# Patient Record
Sex: Male | Born: 1960 | Race: White | Hispanic: No | Marital: Single | Smoking: Never smoker
Health system: Southern US, Community
[De-identification: ages and names within clinical notes are randomized; demographics above are authoritative.]

## PROBLEM LIST (undated history)

## (undated) DIAGNOSIS — E785 Hyperlipidemia, unspecified: Secondary | ICD-10-CM

## (undated) DIAGNOSIS — N049 Nephrotic syndrome with unspecified morphologic changes: Secondary | ICD-10-CM

## (undated) DIAGNOSIS — E039 Hypothyroidism, unspecified: Secondary | ICD-10-CM

## (undated) DIAGNOSIS — I739 Peripheral vascular disease, unspecified: Secondary | ICD-10-CM

## (undated) DIAGNOSIS — M1990 Unspecified osteoarthritis, unspecified site: Secondary | ICD-10-CM

## (undated) DIAGNOSIS — I10 Essential (primary) hypertension: Secondary | ICD-10-CM

## (undated) DIAGNOSIS — Z992 Dependence on renal dialysis: Secondary | ICD-10-CM

## (undated) DIAGNOSIS — D649 Anemia, unspecified: Secondary | ICD-10-CM

## (undated) DIAGNOSIS — E119 Type 2 diabetes mellitus without complications: Secondary | ICD-10-CM

## (undated) DIAGNOSIS — E669 Obesity, unspecified: Secondary | ICD-10-CM

## (undated) DIAGNOSIS — I429 Cardiomyopathy, unspecified: Secondary | ICD-10-CM

## (undated) DIAGNOSIS — N2581 Secondary hyperparathyroidism of renal origin: Secondary | ICD-10-CM

## (undated) DIAGNOSIS — I272 Pulmonary hypertension, unspecified: Secondary | ICD-10-CM

## (undated) DIAGNOSIS — K219 Gastro-esophageal reflux disease without esophagitis: Secondary | ICD-10-CM

## (undated) HISTORY — DX: Unspecified osteoarthritis, unspecified site: M19.90

## (undated) HISTORY — DX: Type 2 diabetes mellitus without complications: E11.9

## (undated) HISTORY — PX: TONSILLECTOMY: SUR1361

## (undated) HISTORY — DX: Peripheral vascular disease, unspecified: I73.9

## (undated) HISTORY — DX: Gastro-esophageal reflux disease without esophagitis: K21.9

---

## 2016-06-13 HISTORY — PX: CATARACT EXTRACTION, BILATERAL: SHX1313

## 2017-12-28 ENCOUNTER — Other Ambulatory Visit: Payer: Self-pay

## 2017-12-28 ENCOUNTER — Emergency Department (HOSPITAL_COMMUNITY): Payer: Self-pay

## 2017-12-28 ENCOUNTER — Emergency Department (HOSPITAL_COMMUNITY)
Admission: EM | Admit: 2017-12-28 | Discharge: 2017-12-28 | Disposition: A | Discharge status: Home / Self Care | Payer: Self-pay | Attending: Emergency Medicine | Admitting: Emergency Medicine

## 2017-12-28 ENCOUNTER — Encounter (HOSPITAL_COMMUNITY): Payer: Self-pay | Admitting: Emergency Medicine

## 2017-12-28 DIAGNOSIS — E1165 Type 2 diabetes mellitus with hyperglycemia: Secondary | ICD-10-CM | POA: Insufficient documentation

## 2017-12-28 DIAGNOSIS — E119 Type 2 diabetes mellitus without complications: Secondary | ICD-10-CM

## 2017-12-28 DIAGNOSIS — R739 Hyperglycemia, unspecified: Secondary | ICD-10-CM

## 2017-12-28 DIAGNOSIS — Z139 Encounter for screening, unspecified: Secondary | ICD-10-CM

## 2017-12-28 DIAGNOSIS — L03031 Cellulitis of right toe: Secondary | ICD-10-CM | POA: Insufficient documentation

## 2017-12-28 LAB — BLOOD GAS, VENOUS
Acid-Base Excess: 2.2 mmol/L — ABNORMAL HIGH (ref 0.0–2.0)
Bicarbonate: 24.5 mmol/L (ref 20.0–28.0)
FIO2: 21
O2 Saturation: 46.6 %
PATIENT TEMPERATURE: 37.4
PCO2 VEN: 44.8 mmHg (ref 44.0–60.0)
pH, Ven: 7.39 (ref 7.250–7.430)

## 2017-12-28 LAB — BASIC METABOLIC PANEL
Anion gap: 10 (ref 5–15)
BUN: 16 mg/dL (ref 6–20)
CALCIUM: 9.5 mg/dL (ref 8.9–10.3)
CO2: 26 mmol/L (ref 22–32)
CREATININE: 1.07 mg/dL (ref 0.61–1.24)
Chloride: 93 mmol/L — ABNORMAL LOW (ref 98–111)
GFR calc non Af Amer: >60 mL/min (ref >60)
Glucose, Bld: 504 mg/dL (ref 70–99)
Potassium: 4.5 mmol/L (ref 3.5–5.1)
SODIUM: 129 mmol/L — AB (ref 135–145)

## 2017-12-28 LAB — URINALYSIS, ROUTINE W REFLEX MICROSCOPIC
Bacteria, UA: NONE SEEN
Bilirubin Urine: NEGATIVE
HGB URINE DIPSTICK: NEGATIVE
Ketones, ur: NEGATIVE mg/dL
LEUKOCYTES UA: NEGATIVE
Nitrite: NEGATIVE
PH: 5 (ref 5.0–8.0)
PROTEIN: NEGATIVE mg/dL
Specific Gravity, Urine: 1.028 (ref 1.005–1.030)

## 2017-12-28 LAB — CBC
HCT: 45.4 % (ref 39.0–52.0)
Hemoglobin: 15.8 g/dL (ref 13.0–17.0)
MCH: 30.1 pg (ref 26.0–34.0)
MCHC: 34.8 g/dL (ref 30.0–36.0)
MCV: 86.5 fL (ref 78.0–100.0)
PLATELETS: 196 10*3/uL (ref 150–400)
RBC: 5.25 MIL/uL (ref 4.22–5.81)
RDW: 12.5 % (ref 11.5–15.5)
WBC: 6.7 10*3/uL (ref 4.0–10.5)

## 2017-12-28 LAB — CBG MONITORING, ED
Glucose-Capillary: 495 mg/dL — ABNORMAL HIGH (ref 70–99)
Glucose-Capillary: 332 mg/dL — ABNORMAL HIGH (ref 70–99)

## 2017-12-28 LAB — POCT GLYCOSYLATED HEMOGLOBIN (HGB A1C): HEMOGLOBIN A1C: 12.9 % — AB (ref 4.0–5.6)

## 2017-12-28 LAB — GLUCOSE, POCT (MANUAL RESULT ENTRY): POC Glucose: 582 mg/dl — AB (ref 70–99)

## 2017-12-28 MED ORDER — SODIUM CHLORIDE 0.9 % IV BOLUS
1000.0000 mL | Freq: Once | INTRAVENOUS | Status: AC
  Administered 2017-12-28: 1000 mL via INTRAVENOUS

## 2017-12-28 MED ORDER — METFORMIN HCL 500 MG PO TABS: 500.0000 mg | ORAL_TABLET | Freq: Two times a day (BID) | ORAL | 0 refills | Status: DC

## 2017-12-28 MED ORDER — METFORMIN HCL 500 MG PO TABS
500.0000 mg | ORAL_TABLET | Freq: Once | ORAL | Status: AC
  Administered 2017-12-28: 500 mg via ORAL
  Filled 2017-12-28: qty 1

## 2017-12-28 MED ORDER — DOXYCYCLINE HYCLATE 100 MG PO CAPS: 100.0000 mg | ORAL_CAPSULE | Freq: Two times a day (BID) | ORAL | 0 refills | Status: DC

## 2017-12-28 NOTE — ED Notes (Addendum)
CBG 495.

## 2017-12-28 NOTE — ED Notes (Signed)
CRITICAL VALUE ALERT  Critical Value:  pH 7.39, pCO2 44.8, pO2 too low to decect  Date & Time Notied:  12/28/17 1650  Provider Notified: Dr. Alvino Chapel notified  Orders Received/Actions taken: EDP notified, no further orders given. Primary RN notified as well.

## 2017-12-28 NOTE — ED Triage Notes (Signed)
Sent by [facility] for high cbg, pt is undiagnosed diabetic.  Pt went to clinic for sore to rt big toe.

## 2017-12-28 NOTE — Congregational Nurse Program (Signed)
Congregational Nurse Program Note  Date of Encounter: 12/28/2017  Past Medical History: No past medical history on file.  Encounter Details: CNP Questionnaire - 12/28/17 1508      Questionnaire   Patient Status  Not Applicable    Race  White or Caucasian    Location Patient Served At  [facility]  Not Applicable    Uninsured  Uninsured (NEW 1x/quarter)    Food  No food insecurities    Housing/Utilities  Yes, have permanent housing    Transportation  Yes, need transportation assistance    Interpersonal Safety  Yes, feel physically and emotionally safe where you currently live    Medication  No medication insecurities    Medical Provider  No    Referrals  [facility];Medication Assistance;Primary Care Provider/Clinic;Emergency Department    ED Visit Averted  Not Applicable    Life-Saving Intervention Made  Yes       New client today into [place]. Mother called in tears that her son had been treated over a week ago at an Urgent Care in [place] and was given antibiotics and his toe was not improving. Appointment made to come into [facility] today at 1:45pm for evaluation and screening and referral to primary care as needed and if needed Emergency Room referral. Client has been unemployed for over a year and is currently uninsured. Mother states he lives beside her and she pays his bills.   Client in today and states he just finished a 10 day course of antibiotics taking the last pill today. He was prescribed SMZ-TMP DS 800-160 mg on 12/18/17.  Client states that he had some "calluses on my right big toe and I used a pedi tool to shave it off" He states that about 2 days later in turned red and his mother took him to Urgent care. He states it has been draining what he describes as blood. Client's right great toe red, and underneath about nickel sized dark area with a white center. No drainage noted but area of stain on patient's  sock. Top of toe area appears to have peeled and is reddened. Ball of client's foot with some redness. No swelling noted in foot or leg. Client afebrile today at 98.4 orally.  Vital signs: blood pressure left arm, sitting, normal cuff 148/97, pulse 108, respirations 12 non labored. O2 saturation room air 99%. Non fasting fingerstick glucose 582 ( client reports eating about 2 hours ago ). Client denies ever being diagnosed with diabetes. His mother is diabetic. POC A1C checked with result of 12.9 %  Client denies any chest pains or shortness of breath. He does report a recent dog bite on left hand that he was also recently treated with antibiotics and cream from a [facility]. He states he has not had any primary medical care in over a year since loosing his insurance.  Does report some blurred vision even with glasses, some excessive thirst, and he reports frequent urination at night and he reports weight loss without dieting. Denies nausea or vomiting. He does report dry skin on arms and legs and leg cramps in his thighs recently. He also complains of history of acid reflux but is unsure of what he takes. Denies problems with his bowels. He does report headaches when out in the "heat".   Past medical History: Arthritis GERD Dog bite (treated at [facility]) Right great toe infection treated at [facility] [place] (  completed antibiotics on 12/28/17.  Surgical history Cataract surgery Unknown date  Plan: Discussed with client referral to [facility] Emergency room today for treatment and evaluation of Right great toe ? Ulceration and  Non fasting glucose over 500 in undiagnosed diabetic A1C 12.9. Client agreeable, Mother will transport. Mother extremely emotional regarding her son.  Discussed options for Medical provider.  Mother [place] [facility] told her to call back on August 1 to make an appointment. RN discussed that we could refer client to [facility] and have possible appointment next week.  Client wants referral to [facility]. Referral made to the Free Clinic and appointment secured for 01/01/18 at 10:30 am. Contact information given to client.  RN also referred client to Care Connect and Care Connect enrollment person will meet client at his free clinic appointment on Monday for eligibility screening. Discussed earlier documents to bring and that mother will give a letter stating she supports her son by paying his bills.  Mom is also anxious about applying for Medicaid and also Social Security disability. RN discussed that can be a lengthy process and that we need to first address his wound and his blood glucose.  Discussed that client will need to apply for medicaid at [place] and that social security disability is applied for at [employer] administration. Again client's mother is very emotional and tearful. Reassured client and mother that reason for sending client to ER and referral to primary care at Free clinic was to assist client to better health. RN discussed following provider instructions regarding medications and diet as well as keeping any appointments. Handouts given on diabetes, my plate nutrition guide.  Will follow up with client after Free Clinic appointment on Monday. Instructed client and mother to call if client cannot make appointment Monday and number to call.   Client and mother agreeable to plan and for follow up.   [place]

## 2017-12-28 NOTE — ED Provider Notes (Signed)
[facility] EMERGENCY DEPARTMENT Provider Note   CSN: [id number] Arrival date & time: 12/28/17  1512     History   Chief Complaint Chief Complaint  Patient presents with  . Hyperglycemia    HPI [name] [name] is a 57 y.o. male.     57 year old male presents with new onset diabetes.  He was sent from a nurse clinic office after being diagnosed with a new onset diabetes.  His point-of-care A1c was 12.9 and his fingerstick glucose was 582.  The patient recently has been having a callus and redness/infection to his right great toe.  He was put on Bactrim starting on 7/8 and has finished this.  The redness and swelling is improved.  The patient denies otherwise feeling fatigued or having fevers, polyuria, polydipsia.  He denies any significant past medical history.  History reviewed. No pertinent past medical history.  There are no active problems to display for this patient.   Past Surgical History:  Procedure Laterality Date  . TONSILLECTOMY          Home Medications    Prior to Admission medications   Medication Sig Start Date End Date Taking? Authorizing Provider  acetaminophen (TYLENOL) 500 MG tablet Take 500 mg by mouth every 6 (six) hours as needed.   Yes [provider]  cimetidine (TAGAMET HB) 200 MG tablet Take 200 mg by mouth daily as needed (for acid reducer).   Yes [provider]  hydroxypropyl methylcellulose / hypromellose (ISOPTO TEARS / GONIOVISC) 2.5 % ophthalmic solution Place 1 drop into both eyes 3 (three) times daily as needed for dry eyes.   Yes [provider]  Omega-3 Fat Ac-Cholecalciferol (FLEX OMEGA BENEFITS/VIT D-3) [id number] MG-UNIT CAPS Take 1 tablet by mouth daily as needed.   Yes [provider]  sulfamethoxazole-trimethoprim (BACTRIM DS,SEPTRA DS) 800-160 MG tablet Take 1 tablet by mouth 2 (two) times daily. 12/18/17  Yes [provider]  doxycycline (VIBRAMYCIN) 100 MG capsule Take 1 capsule (100 mg  total) by mouth 2 (two) times daily. One po bid x 7 days 12/28/17   Sherwood Gambler, MD  metFORMIN (GLUCOPHAGE) 500 MG tablet Take 1 tablet (500 mg total) by mouth 2 (two) times daily with a meal. 12/28/17   Sherwood Gambler, MD    Family History No family history on file.  Social History Social History   Tobacco Use  . Smoking status: Never Smoker  . Smokeless tobacco: Never Used  Substance Use Topics  . Alcohol use: Never    Frequency: Never  . Drug use: Never     Allergies   Patient has no known allergies.   Review of Systems Review of Systems  Constitutional: Negative for fatigue and fever.  Respiratory: Negative for shortness of breath.   Cardiovascular: Negative for chest pain.  Gastrointestinal: Negative for vomiting.  Endocrine: Negative for polydipsia and polyuria.  Musculoskeletal: Positive for arthralgias and joint swelling.  Skin: Positive for color change and wound.  All other systems reviewed and are negative.    Physical Exam Updated Vital Signs BP (!) 166/88 (BP Location: Right Arm)   Pulse 80   Temp 99.3 F (37.4 C) (Temporal)   Resp 16   Ht 5\' 10"  (1.778 m)   Wt 62.1 kg (137 lb)   SpO2 100%   BMI 19.66 kg/m   Physical Exam  Constitutional: He is oriented to person, place, and time. He appears well-developed and well-nourished.  HENT:  Head: Normocephalic and atraumatic.  Right Ear: External  ear normal.  Left Ear: External ear normal.  Nose: Nose normal.  Eyes: Right eye exhibits no discharge. Left eye exhibits no discharge.  Neck: Neck supple.  Cardiovascular: Regular rhythm and normal heart sounds. Tachycardia present.  Pulmonary/Chest: Effort normal and breath sounds normal.  Abdominal: Soft. He exhibits no distension. There is no tenderness.  Musculoskeletal:  Right great toe with mild erythema and superficial skin break down. No deep wound or drainage. No increased warmth  Neurological: He is alert and oriented to person, place, and  time.  Skin: Skin is warm and dry.  Nursing note and vitals reviewed.    ED Treatments / Results  Labs (all labs ordered are listed, but only abnormal results are displayed) Labs Reviewed  BASIC METABOLIC PANEL - Abnormal; Notable for the following components:      Result Value   Sodium 129 (*)    Chloride 93 (*)    Glucose, Bld 504 (*)    All other components within normal limits  URINALYSIS, ROUTINE W REFLEX MICROSCOPIC - Abnormal; Notable for the following components:   Color, Urine STRAW (*)    Glucose, UA >=500 (*)    All other components within normal limits  BLOOD GAS, VENOUS - Abnormal; Notable for the following components:   Acid-Base Excess 2.2 (*)    All other components within normal limits  CBG MONITORING, ED - Abnormal; Notable for the following components:   Glucose-Capillary 495 (*)    All other components within normal limits  CBG MONITORING, ED - Abnormal; Notable for the following components:   Glucose-Capillary 332 (*)    All other components within normal limits  CBC    EKG None  Radiology Dg Foot Complete Right  Result Date: 12/28/2017 CLINICAL DATA:  Wound to the right big toe for 1 week.  No injury. EXAM: RIGHT FOOT COMPLETE - 3+ VIEW COMPARISON:  None. FINDINGS: There is no evidence of fracture or dislocation. There is no evidence of bone destruction to suggest osteomyelitis. Plantar calcaneal spur is identified. Minimal soft tissue irregularity is identified in the distal right great toe consistent with history described wound. IMPRESSION: No evidence of acute bony injury or underlying osteomyelitis.Minimal soft tissue irregularity is identified in the distal right great toe consistent with history described wound. Electronically Signed   By: Abelardo Diesel M.D.   On: 12/28/2017 17:00    Procedures Procedures (including critical care time)  Medications Ordered in ED Medications  sodium chloride 0.9 % bolus 1,000 mL (0 mLs Intravenous Stopped  12/28/17 1805)  sodium chloride 0.9 % bolus 1,000 mL (0 mLs Intravenous Stopped 12/28/17 1805)  metFORMIN (GLUCOPHAGE) tablet 500 mg (500 mg Oral Given 12/28/17 1808)     Initial Impression / Assessment and Plan / ED Course  I have reviewed the triage vital signs and the nursing notes.  Pertinent labs & imaging results that were available during my care of the patient were reviewed by me and considered in my medical decision making (see chart for details).     Patient's work-up is consistent with new onset diabetes.  However he does not have acidosis or ketones in the urine. He otherwise has no acute complaints.  His toe is not hurting anymore.  However it is a little erythematous while did improve a little bit with Bactrim I will also give him doxycycline.  I have started him on metformin after being given IV fluids in the ED.  Glucose is coming down appropriately.  He already has  a PCP set up for a new visit in about 4 days.  Thus I think his outpatient follow-up is close enough that he can be discharged home.  No significant elect light disturbance.  No obvious osteomyelitis.  We discussed diet and exercise. Return precautions.  Final Clinical Impressions(s) / ED Diagnoses   Final diagnoses:  New onset type 2 diabetes mellitus ([place])  Hyperglycemia  Cellulitis of great toe of right foot    ED Discharge Orders        Ordered    doxycycline (VIBRAMYCIN) 100 MG capsule  2 times daily     12/28/17 1742    metFORMIN (GLUCOPHAGE) 500 MG tablet  2 times daily with meals     12/28/17 1742       Sherwood Gambler, MD 12/28/17 1831

## 2017-12-28 NOTE — ED Notes (Signed)
Date and time results received: 12/28/17 1703 (use smartphrase ".now" to insert current time)  Test: GLUCOSE Critical Value: 504  Name of Provider Notified: [place]  Orders Received? Or Actions Taken?: NONE

## 2018-01-01 ENCOUNTER — Other Ambulatory Visit (HOSPITAL_COMMUNITY)
Admission: RE | Admit: 2018-01-01 | Discharge: 2018-01-01 | Disposition: A | Discharge status: Home / Self Care | Payer: Self-pay | Source: Ambulatory Visit | Attending: Physician Assistant | Admitting: Physician Assistant

## 2018-01-01 ENCOUNTER — Encounter: Payer: Self-pay | Admitting: Physician Assistant

## 2018-01-01 ENCOUNTER — Ambulatory Visit: Payer: Self-pay | Admitting: Physician Assistant

## 2018-01-01 VITALS — BP 120/78 | HR 92 | Temp 98.1°F | Ht 67.5 in | Wt 137.0 lb

## 2018-01-01 DIAGNOSIS — Z5320 Procedure and treatment not carried out because of patient's decision for unspecified reasons: Secondary | ICD-10-CM

## 2018-01-01 DIAGNOSIS — Z125 Encounter for screening for malignant neoplasm of prostate: Secondary | ICD-10-CM

## 2018-01-01 DIAGNOSIS — Z13220 Encounter for screening for lipoid disorders: Secondary | ICD-10-CM | POA: Insufficient documentation

## 2018-01-01 DIAGNOSIS — Z7689 Persons encountering health services in other specified circumstances: Secondary | ICD-10-CM

## 2018-01-01 DIAGNOSIS — S91109S Unspecified open wound of unspecified toe(s) without damage to nail, sequela: Secondary | ICD-10-CM

## 2018-01-01 DIAGNOSIS — R739 Hyperglycemia, unspecified: Secondary | ICD-10-CM

## 2018-01-01 DIAGNOSIS — E1169 Type 2 diabetes mellitus with other specified complication: Secondary | ICD-10-CM

## 2018-01-01 LAB — COMPREHENSIVE METABOLIC PANEL
ALBUMIN: 4.2 g/dL (ref 3.5–5.0)
ALK PHOS: 102 U/L (ref 38–126)
ALT: 12 U/L (ref 0–44)
AST: 11 U/L — AB (ref 15–41)
Anion gap: 9 (ref 5–15)
BUN: 18 mg/dL (ref 6–20)
CHLORIDE: 101 mmol/L (ref 98–111)
CO2: 27 mmol/L (ref 22–32)
CREATININE: 0.67 mg/dL (ref 0.61–1.24)
Calcium: 9.9 mg/dL (ref 8.9–10.3)
GFR calc Af Amer: >60 mL/min (ref >60)
GFR calc non Af Amer: >60 mL/min (ref >60)
GLUCOSE: 233 mg/dL — AB (ref 70–99)
Potassium: 4.3 mmol/L (ref 3.5–5.1)
SODIUM: 137 mmol/L (ref 135–145)
Total Bilirubin: 0.8 mg/dL (ref 0.3–1.2)
Total Protein: 8 g/dL (ref 6.5–8.1)

## 2018-01-01 LAB — PSA: PROSTATIC SPECIFIC ANTIGEN: 1.42 ng/mL (ref 0.00–4.00)

## 2018-01-01 LAB — GLUCOSE, POCT (MANUAL RESULT ENTRY): POC Glucose: 219 mg/dl — AB (ref 70–99)

## 2018-01-01 LAB — LIPID PANEL
Cholesterol: 248 mg/dL — ABNORMAL HIGH (ref 0–200)
HDL: 46 mg/dL (ref >40)
LDL CALC: UNDETERMINED mg/dL (ref 0–99)
TRIGLYCERIDES: 428 mg/dL — AB (ref <150)
Total CHOL/HDL Ratio: 5.4 RATIO
VLDL: UNDETERMINED mg/dL (ref 0–40)

## 2018-01-01 NOTE — Progress Notes (Signed)
BP 120/78 (BP Location: Left Arm, Patient Position: Sitting, Cuff Size: Normal)   Pulse 92   Temp 98.1 F (36.7 C)   Ht 5' 7.5" (1.715 m)   Wt 137 lb (62.1 kg)   SpO2 98%   BMI 21.14 kg/m    Subjective:    Patient ID: [name] [name], male    DOB: Dec 22, 1960, 57 y.o.   MRN: [id number]  HPI: [name] [name] is a 57 y.o. male presenting on 01/01/2018 for New Patient (Initial Visit) (pt here today with his mother. pt was previously seen at [facility] pt says they were not primary care. pt states they treated him for arthritis on his legs); Arthritis (pt states makes it difficult to walk); Diabetes (pt states [facility] sent to [facility] on 12-28-17. pt states that is when he was dx with DM. pt states sugars were in the 300 after treatment.); and Foot Problem (R great toe. pt states it is infected but is getting better. pt was given a second round of antibiotics which has made it improve.)   HPI Chief Complaint  Patient presents with  . New Patient (Initial Visit)    pt here today with his mother. pt was previously seen at [facility] at [facility] pt says they were not primary care. pt states they treated him for arthritis on his legs  . Arthritis    pt states makes it difficult to walk  . Diabetes    pt states [facility]ores sent to [facility] on 12-28-17. pt states that is when he was dx with DM. pt states sugars were in the 300 after treatment.  . Foot Problem    R great toe. pt states it is infected but is getting better. pt was given a second round of antibiotics which has made it improve.    Pt previously worked at [employer] in [place].   He missed too many days of work so he was fired.   His toe wound started after he used a pedi-scrubber trying to remove a callus.  He is currently on his second round of antibiotics.  Pt says he feels like the wound is improving.   Pt looks to his mother for an answer when asked how does he  feel.    Relevant past medical, surgical, family and social history reviewed and updated as indicated. Interim medical history since our last visit reviewed. Allergies and medications reviewed and updated.   Current Outpatient Medications:  .  acetaminophen (TYLENOL) 500 MG tablet, Take 500 mg by mouth every 6 (six) hours as needed (arthritis). , Disp: , Rfl:  .  Calcium Carbonate-Simethicone (ALKA-SELTZER HEARTBURN + GAS) 750-80 MG CHEW, Chew 1 tablet by mouth daily as needed., Disp: , Rfl:  .  cimetidine (TAGAMET HB) 200 MG tablet, Take 200 mg by mouth daily as needed (for acid reducer)., Disp: , Rfl:  .  doxycycline (VIBRAMYCIN) 100 MG capsule, Take 1 capsule (100 mg total) by mouth 2 (two) times daily. One po bid x 7 days, Disp: 14 capsule, Rfl: 0 .  metFORMIN (GLUCOPHAGE) 500 MG tablet, Take 1 tablet (500 mg total) by mouth 2 (two) times daily with a meal., Disp: 60 tablet, Rfl: 0   Review of Systems  Constitutional: Positive for unexpected weight change. Negative for appetite change, chills, diaphoresis, fatigue and fever.  HENT: Negative for congestion, dental problem, drooling, ear pain, facial swelling, hearing loss, mouth sores, sneezing, sore throat, trouble swallowing and voice change.  Eyes: Negative for pain, discharge, redness, itching and visual disturbance.  Respiratory: Negative for cough, choking, shortness of breath and wheezing.   Cardiovascular: Negative for chest pain, palpitations and leg swelling.  Gastrointestinal: Negative for abdominal pain, blood in stool, constipation, diarrhea and vomiting.  Endocrine: Negative for cold intolerance, heat intolerance and polydipsia.  Genitourinary: Negative for decreased urine volume, dysuria and hematuria.  Musculoskeletal: Positive for arthralgias and gait problem. Negative for back pain.  Skin: Negative for rash.  Allergic/Immunologic: Negative for environmental allergies.  Neurological: Negative for seizures, syncope,  light-headedness and headaches.  Hematological: Negative for adenopathy.  Psychiatric/Behavioral: Negative for agitation, dysphoric mood and suicidal ideas. The patient is not nervous/anxious.     Per HPI unless specifically indicated above     Objective:    BP 120/78 (BP Location: Left Arm, Patient Position: Sitting, Cuff Size: Normal)   Pulse 92   Temp 98.1 F (36.7 C)   Ht 5' 7.5" (1.715 m)   Wt 137 lb (62.1 kg)   SpO2 98%   BMI 21.14 kg/m   Wt Readings from Last 3 Encounters:  01/01/18 137 lb (62.1 kg)  12/28/17 137 lb (62.1 kg)  12/28/17 137 lb 6.4 oz (62.3 kg)    Physical Exam  Constitutional: He is oriented to person, place, and time. He appears well-developed and well-nourished.  HENT:  Head: Normocephalic and atraumatic.  Mouth/Throat: Oropharynx is clear and moist. No oropharyngeal exudate.  Eyes: Pupils are equal, round, and reactive to light. Conjunctivae and EOM are normal.  Neck: Neck supple. No thyromegaly present.  Cardiovascular: Normal rate and regular rhythm.  Pulmonary/Chest: Effort normal and breath sounds normal. He has no wheezes. He has no rales.  Abdominal: Soft. Bowel sounds are normal. He exhibits no mass. There is no hepatosplenomegaly. There is no tenderness.  Musculoskeletal: He exhibits no edema.  Lymphadenopathy:    He has no cervical adenopathy.  Neurological: He is alert and oriented to person, place, and time.  Skin: Skin is warm and dry. No rash noted.  Wound R great toe appears to be healing.  No purulence or swelling.  Psychiatric: He has a normal mood and affect. His behavior is normal. Thought content normal.  Vitals reviewed.           Results for orders placed or performed during the hospital encounter of 38/18/29  Basic metabolic panel  Result Value Ref Range   Sodium 129 (L) 135 - 145 mmol/L   Potassium 4.5 3.5 - 5.1 mmol/L   Chloride 93 (L) 98 - 111 mmol/L   CO2 26 22 - 32 mmol/L   Glucose, Bld 504 (HH) 70 - 99  mg/dL   BUN 16 6 - 20 mg/dL   Creatinine, Ser 1.07 0.61 - 1.24 mg/dL   Calcium 9.5 8.9 - 10.3 mg/dL   GFR calc non Af Amer >60 >60 mL/min   GFR calc Af Amer >60 >60 mL/min   Anion gap 10 5 - 15  CBC  Result Value Ref Range   WBC 6.7 4.0 - 10.5 K/uL   RBC 5.25 4.22 - 5.81 MIL/uL   Hemoglobin 15.8 13.0 - 17.0 g/dL   HCT 45.4 39.0 - 52.0 %   MCV 86.5 78.0 - 100.0 fL   MCH 30.1 26.0 - 34.0 pg   MCHC 34.8 30.0 - 36.0 g/dL   RDW 12.5 11.5 - 15.5 %   Platelets 196 150 - 400 K/uL  Urinalysis, Routine w reflex microscopic  Result Value Ref Range  Color, Urine STRAW (A) YELLOW   APPearance CLEAR CLEAR   Specific Gravity, Urine 1.028 1.005 - 1.030   pH 5.0 5.0 - 8.0   Glucose, UA >=500 (A) NEGATIVE mg/dL   Hgb urine dipstick NEGATIVE NEGATIVE   Bilirubin Urine NEGATIVE NEGATIVE   Ketones, ur NEGATIVE NEGATIVE mg/dL   Protein, ur NEGATIVE NEGATIVE mg/dL   Nitrite NEGATIVE NEGATIVE   Leukocytes, UA NEGATIVE NEGATIVE   RBC / HPF 0-5 0 - 5 RBC/hpf   WBC, UA 0-5 0 - 5 WBC/hpf   Bacteria, UA NONE SEEN NONE SEEN   Mucus PRESENT   Blood gas, venous  Result Value Ref Range   FIO2 21.00    pH, Ven 7.39 7.250 - 7.430   pCO2, Ven 44.8 44.0 - 60.0 mmHg   pO2, Ven  32.0 - 45.0 mmHg    CRITICAL RESULT CALLED TO, READ BACK BY AND VERIFIED WITH:   Bicarbonate 24.5 20.0 - 28.0 mmol/L   Acid-Base Excess 2.2 (H) 0.0 - 2.0 mmol/L   O2 Saturation 46.6 %   Patient temperature 37.4    Collection site VEIN    Drawn by DRAWN BY RN    Sample type VEIN   CBG monitoring, ED  Result Value Ref Range   Glucose-Capillary 495 (H) 70 - 99 mg/dL  CBG monitoring, ED  Result Value Ref Range   Glucose-Capillary 332 (H) 70 - 99 mg/dL      Assessment & Plan:   Encounter Diagnoses  Name Primary?  . Encounter to establish care Yes  . Type 2 diabetes mellitus with other specified complication, unspecified whether long term insulin use ([place])   . Screening cholesterol level   . Open toe wound, sequela   .  Hyperglycemia   . Screening for prostate cancer   . Colon cancer screening declined     -Pt declined colon cancer screening test/iFOBT.  He was encouragd to think about getting the colon cancer screening test -Pt has appointment aug 8 for with social services to try to get medcaid -pt to get a few more lab tests done.  He says he doesn't want any more tests but his mother tells him he needs to so he says he will -pt is counseled to avoid using scrubbing or cutting on feet -pt is counseled to look at his feet every day.  He is to RTO right away if his toe wound starts to look worse -pt to follow up next week to recheck toe and review labs.  Discussed with pt that his medication will likely be adjusted at next OV.

## 2018-01-01 NOTE — Patient Instructions (Signed)
Diabetes Mellitus and Nutrition When you have diabetes (diabetes mellitus), it is very important to have healthy eating habits because your blood sugar (glucose) levels are greatly affected by what you eat and drink. Eating healthy foods in the appropriate amounts, at about the same times every day, can help you:  Control your blood glucose.  Lower your risk of heart disease.  Improve your blood pressure.  Reach or maintain a healthy weight.  Every person with diabetes is different, and each person has different needs for a meal plan. Your health care provider may recommend that you work with a diet and nutrition specialist (dietitian) to make a meal plan that is best for you. Your meal plan may vary depending on factors such as:  The calories you need.  The medicines you take.  Your weight.  Your blood glucose, blood pressure, and cholesterol levels.  Your activity level.  Other health conditions you have, such as heart or kidney disease.  How do carbohydrates affect me? Carbohydrates affect your blood glucose level more than any other type of food. Eating carbohydrates naturally increases the amount of glucose in your blood. Carbohydrate counting is a method for keeping track of how many carbohydrates you eat. Counting carbohydrates is important to keep your blood glucose at a healthy level, especially if you use insulin or take certain oral diabetes medicines. It is important to know how many carbohydrates you can safely have in each meal. This is different for every person. Your dietitian can help you calculate how many carbohydrates you should have at each meal and for snack. Foods that contain carbohydrates include:  Bread, cereal, rice, pasta, and crackers.  Potatoes and corn.  Peas, beans, and lentils.  Milk and yogurt.  Fruit and juice.  Desserts, such as cakes, cookies, ice cream, and candy.  How does alcohol affect me? Alcohol can cause a sudden decrease in blood  glucose (hypoglycemia), especially if you use insulin or take certain oral diabetes medicines. Hypoglycemia can be a life-threatening condition. Symptoms of hypoglycemia (sleepiness, dizziness, and confusion) are similar to symptoms of having too much alcohol. If your health care provider says that alcohol is safe for you, follow these guidelines:  Limit alcohol intake to no more than 1 drink per day for nonpregnant women and 2 drinks per day for men. One drink equals 12 oz of beer, 5 oz of wine, or 1 oz of hard liquor.  Do not drink on an empty stomach.  Keep yourself hydrated with water, diet soda, or unsweetened iced tea.  Keep in mind that regular soda, juice, and other mixers may contain a lot of sugar and must be counted as carbohydrates.  What are tips for following this plan? Reading food labels  Start by checking the serving size on the label. The amount of calories, carbohydrates, fats, and other nutrients listed on the label are based on one serving of the food. Many foods contain more than one serving per package.  Check the total grams (g) of carbohydrates in one serving. You can calculate the number of servings of carbohydrates in one serving by dividing the total carbohydrates by 15. For example, if a food has 30 g of total carbohydrates, it would be equal to 2 servings of carbohydrates.  Check the number of grams (g) of saturated and trans fats in one serving. Choose foods that have low or no amount of these fats.  Check the number of milligrams (mg) of sodium in one serving. Most people   should limit total sodium intake to less than 2,300 mg per day.  Always check the nutrition information of foods labeled as "low-fat" or "nonfat". These foods may be higher in added sugar or refined carbohydrates and should be avoided.  Talk to your dietitian to identify your daily goals for nutrients listed on the label. Shopping  Avoid buying canned, premade, or processed foods. These  foods tend to be high in fat, sodium, and added sugar.  Shop around the outside edge of the grocery store. This includes fresh fruits and vegetables, bulk grains, fresh meats, and fresh dairy. Cooking  Use low-heat cooking methods, such as baking, instead of high-heat cooking methods like deep frying.  Cook using healthy oils, such as olive, canola, or sunflower oil.  Avoid cooking with butter, cream, or high-fat meats. Meal planning  Eat meals and snacks regularly, preferably at the same times every day. Avoid going long periods of time without eating.  Eat foods high in fiber, such as fresh fruits, vegetables, beans, and whole grains. Talk to your dietitian about how many servings of carbohydrates you can eat at each meal.  Eat 4-6 ounces of lean protein each day, such as lean meat, chicken, fish, eggs, or tofu. 1 ounce is equal to 1 ounce of meat, chicken, or fish, 1 egg, or 1/4 cup of tofu.  Eat some foods each day that contain healthy fats, such as avocado, nuts, seeds, and fish. Lifestyle   Check your blood glucose regularly.  Exercise at least 30 minutes 5 or more days each week, or as told by your health care provider.  Take medicines as told by your health care provider.  Do not use any products that contain nicotine or tobacco, such as cigarettes and e-cigarettes. If you need help quitting, ask your health care provider.  Work with a counselor or diabetes educator to identify strategies to manage stress and any emotional and social challenges. What are some questions to ask my health care provider?  Do I need to meet with a diabetes educator?  Do I need to meet with a dietitian?  What number can I call if I have questions?  When are the best times to check my blood glucose? Where to find more information:  American Diabetes Association: diabetes.org/food-and-fitness/food  Academy of Nutrition and Dietetics:  www.eatright.org/resources/health/diseases-and-conditions/diabetes  National Institute of Diabetes and Digestive and Kidney Diseases (NIH): www.niddk.nih.gov/health-information/diabetes/overview/diet-eating-physical-activity Summary  A healthy meal plan will help you control your blood glucose and maintain a healthy lifestyle.  Working with a diet and nutrition specialist (dietitian) can help you make a meal plan that is best for you.  Keep in mind that carbohydrates and alcohol have immediate effects on your blood glucose levels. It is important to count carbohydrates and to use alcohol carefully. This information is not intended to replace advice given to you by your health care provider. Make sure you discuss any questions you have with your health care provider. Document Released: 02/24/2005 Document Revised: 07/04/2016 Document Reviewed: 07/04/2016 Elsevier Interactive Patient Education  2018 Elsevier Inc.  

## 2018-01-02 LAB — MICROALBUMIN, URINE: MICROALB UR: 114 ug/mL — AB

## 2018-01-02 LAB — C-PEPTIDE: C-Peptide: 2.9 ng/mL (ref 1.1–4.4)

## 2018-01-08 ENCOUNTER — Encounter: Payer: Self-pay | Admitting: Physician Assistant

## 2018-01-08 ENCOUNTER — Telehealth: Payer: Self-pay

## 2018-01-08 ENCOUNTER — Ambulatory Visit: Payer: Self-pay | Admitting: Physician Assistant

## 2018-01-08 VITALS — BP 152/90 | HR 82 | Temp 98.4°F | Ht 67.5 in | Wt 141.2 lb

## 2018-01-08 DIAGNOSIS — S91109S Unspecified open wound of unspecified toe(s) without damage to nail, sequela: Secondary | ICD-10-CM

## 2018-01-08 DIAGNOSIS — I10 Essential (primary) hypertension: Secondary | ICD-10-CM

## 2018-01-08 DIAGNOSIS — E1165 Type 2 diabetes mellitus with hyperglycemia: Secondary | ICD-10-CM | POA: Insufficient documentation

## 2018-01-08 DIAGNOSIS — K219 Gastro-esophageal reflux disease without esophagitis: Secondary | ICD-10-CM

## 2018-01-08 DIAGNOSIS — E785 Hyperlipidemia, unspecified: Secondary | ICD-10-CM | POA: Insufficient documentation

## 2018-01-08 DIAGNOSIS — E1169 Type 2 diabetes mellitus with other specified complication: Secondary | ICD-10-CM | POA: Insufficient documentation

## 2018-01-08 MED ORDER — SITAGLIPTIN PHOSPHATE 100 MG PO TABS: 100.0000 mg | ORAL_TABLET | Freq: Every day | ORAL | 0 refills | Status: DC

## 2018-01-08 MED ORDER — METFORMIN HCL 1000 MG PO TABS: 1000.0000 mg | ORAL_TABLET | Freq: Two times a day (BID) | ORAL | 1 refills | Status: DC

## 2018-01-08 MED ORDER — ATORVASTATIN CALCIUM 20 MG PO TABS: 20.0000 mg | ORAL_TABLET | Freq: Every day | ORAL | 1 refills | Status: DC

## 2018-01-08 MED ORDER — FISH OIL 1200 MG PO CAPS: 1.0000 | ORAL_CAPSULE | Freq: Two times a day (BID) | ORAL | Status: DC

## 2018-01-08 MED ORDER — LISINOPRIL 10 MG PO TABS: 10.0000 mg | ORAL_TABLET | Freq: Every day | ORAL | 1 refills | Status: DC

## 2018-01-08 MED ORDER — FAMOTIDINE 20 MG PO TABS: 20.0000 mg | ORAL_TABLET | Freq: Two times a day (BID) | ORAL | 3 refills | Status: DC | PRN

## 2018-01-08 NOTE — Progress Notes (Signed)
BP (!) 152/90 (BP Location: Right Arm, Patient Position: Sitting, Cuff Size: Normal)   Pulse 82   Temp 98.4 F (36.9 C)   Ht 5' 7.5" (1.715 m)   Wt 141 lb 4 oz (64.1 kg)   SpO2 98%   BMI 21.80 kg/m    Subjective:    Patient ID: [name] [name], male    DOB: 1960/09/15, 57 y.o.   MRN: [id number]  HPI: [name] [name] is a 57 y.o. male presenting on 01/08/2018 for Diabetes   HPI  Pt says he is stressed a bit today.  He says his foot feels fine.  He finished his doxycycline.    Relevant past medical, surgical, family and social history reviewed and updated as indicated. Interim medical history since our last visit reviewed. Allergies and medications reviewed and updated.   Current Outpatient Medications:  .  acetaminophen (TYLENOL) 500 MG tablet, Take 500 mg by mouth every 6 (six) hours as needed (arthritis). , Disp: , Rfl:  .  Calcium Carbonate-Simethicone (ALKA-SELTZER HEARTBURN + GAS) 750-80 MG CHEW, Chew 1 tablet by mouth daily as needed., Disp: , Rfl:  .  cimetidine (TAGAMET HB) 200 MG tablet, Take 200 mg by mouth daily as needed (for acid reducer)., Disp: , Rfl:  .  metFORMIN (GLUCOPHAGE) 500 MG tablet, Take 1 tablet (500 mg total) by mouth 2 (two) times daily with a meal., Disp: 60 tablet, Rfl: 0 .  doxycycline (VIBRAMYCIN) 100 MG capsule, Take 1 capsule (100 mg total) by mouth 2 (two) times daily. One po bid x 7 days (Patient not taking: Reported on 01/08/2018), Disp: 14 capsule, Rfl: 0  Review of Systems  Constitutional: Negative for appetite change, chills, diaphoresis, fatigue, fever and unexpected weight change.  HENT: Negative for congestion, dental problem, drooling, ear pain, facial swelling, hearing loss, mouth sores, sneezing, sore throat, trouble swallowing and voice change.   Eyes: Negative for pain, discharge, redness, itching and visual disturbance.  Respiratory: Negative for cough, choking, shortness of breath and wheezing.   Cardiovascular: Negative for chest  pain, palpitations and leg swelling.  Gastrointestinal: Negative for abdominal pain, blood in stool, constipation, diarrhea and vomiting.  Endocrine: Negative for cold intolerance, heat intolerance and polydipsia.  Genitourinary: Negative for decreased urine volume, dysuria and hematuria.  Musculoskeletal: Negative for arthralgias, back pain and gait problem.  Skin: Negative for rash.  Allergic/Immunologic: Negative for environmental allergies.  Neurological: Negative for seizures, syncope, light-headedness and headaches.  Hematological: Negative for adenopathy.  Psychiatric/Behavioral: Negative for agitation, dysphoric mood and suicidal ideas. The patient is not nervous/anxious.     Per HPI unless specifically indicated above     Objective:    BP (!) 152/90 (BP Location: Right Arm, Patient Position: Sitting, Cuff Size: Normal)   Pulse 82   Temp 98.4 F (36.9 C)   Ht 5' 7.5" (1.715 m)   Wt 141 lb 4 oz (64.1 kg)   SpO2 98%   BMI 21.80 kg/m   Wt Readings from Last 3 Encounters:  01/08/18 141 lb 4 oz (64.1 kg)  01/01/18 137 lb (62.1 kg)  12/28/17 137 lb (62.1 kg)    Physical Exam  Constitutional: He is oriented to person, place, and time. He appears well-developed and well-nourished.  HENT:  Head: Normocephalic and atraumatic.  Neck: Neck supple.  Cardiovascular: Normal rate and regular rhythm.  Pulmonary/Chest: Effort normal and breath sounds normal. He has no wheezes.  Abdominal: Soft. Bowel sounds are normal. There is no hepatosplenomegaly. There is no tenderness.  Musculoskeletal: He exhibits no edema.  Right great toe wound looks no better than last week and may be slightly worse.  No erythema or purulence  Lymphadenopathy:    He has no cervical adenopathy.  Neurological: He is alert and oriented to person, place, and time.  Skin: Skin is warm and dry.  Psychiatric: He has a normal mood and affect. His behavior is normal.  Vitals reviewed.          A1C 12.9 on  12/28/17  Results for orders placed or performed during the hospital encounter of 01/01/18  PSA  Result Value Ref Range   Prostatic Specific Antigen 1.42 0.00 - 4.00 ng/mL  Microalbumin, urine  Result Value Ref Range   Microalb, Ur 114.0 (H) Not Estab. ug/mL  C-peptide  Result Value Ref Range   C-Peptide 2.9 1.1 - 4.4 ng/mL  Comprehensive metabolic panel  Result Value Ref Range   Sodium 137 135 - 145 mmol/L   Potassium 4.3 3.5 - 5.1 mmol/L   Chloride 101 98 - 111 mmol/L   CO2 27 22 - 32 mmol/L   Glucose, Bld 233 (H) 70 - 99 mg/dL   BUN 18 6 - 20 mg/dL   Creatinine, Ser 0.67 0.61 - 1.24 mg/dL   Calcium 9.9 8.9 - 10.3 mg/dL   Total Protein 8.0 6.5 - 8.1 g/dL   Albumin 4.2 3.5 - 5.0 g/dL   AST 11 (L) 15 - 41 U/L   ALT 12 0 - 44 U/L   Alkaline Phosphatase 102 38 - 126 U/L   Total Bilirubin 0.8 0.3 - 1.2 mg/dL   GFR calc non Af Amer >60 >60 mL/min   GFR calc Af Amer >60 >60 mL/min   Anion gap 9 5 - 15  Lipid panel  Result Value Ref Range   Cholesterol 248 (H) 0 - 200 mg/dL   Triglycerides 428 (H) <150 mg/dL   HDL 46 >40 mg/dL   Total CHOL/HDL Ratio 5.4 RATIO   VLDL UNABLE TO CALCULATE IF TRIGLYCERIDE OVER 400 mg/dL 0 - 40 mg/dL   LDL Cholesterol UNABLE TO CALCULATE IF TRIGLYCERIDE OVER 400 mg/dL 0 - 99 mg/dL      Assessment & Plan:   Encounter Diagnoses  Name Primary?  Marland Kitchen Uncontrolled type 2 diabetes mellitus with hyperglycemia ([name]) Yes  . Open toe wound, sequela   . Hyperlipidemia, unspecified hyperlipidemia type   . Gastroesophageal reflux disease, esophagitis presence not specified   . Essential hypertension      -reviewed labs with pt -Refer for DM eye exam -Low fat diet, atorvastatin and fish oil for lipids -Increase metformin and add januvia for diabetes -lisinopril for blood pressure -pt to stop tagamet and rx pepcid for use as needed for GERD -pt was Signed up for [facility]ist -refer to wound clinic for foot- appointment aug 14.  -pt to follow up here next  week to recheck toe since his appointment at wound clinic not for 2 week.  He is to RTO sooner prn

## 2018-01-08 NOTE — Patient Instructions (Addendum)
For [facility]: -[id number] tax form for 2018 Financial Counselor- [phone] ________________________________________________  Fat and Cholesterol Restricted Diet High levels of fat and cholesterol in your blood may lead to various health problems, such as diseases of the heart, blood vessels, gallbladder, liver, and pancreas. Fats are concentrated sources of energy that come in various forms. Certain types of fat, including saturated fat, may be harmful in excess. Cholesterol is a substance needed by your body in small amounts. Your body makes all the cholesterol it needs. Excess cholesterol comes from the food you eat. When you have high levels of cholesterol and saturated fat in your blood, health problems can develop because the excess fat and cholesterol will gather along the walls of your blood vessels, causing them to narrow. Choosing the right foods will help you control your intake of fat and cholesterol. This will help keep the levels of these substances in your blood within normal limits and reduce your risk of disease. What is my plan? Your health care provider recommends that you:  Limit your fat intake to ______% or less of your total calories per day.  Limit the amount of cholesterol in your diet to less than _________mg per day.  Eat 20-30 grams of fiber each day.  What types of fat should I choose?  Choose healthy fats more often. Choose monounsaturated and polyunsaturated fats, such as olive and canola oil, flaxseeds, walnuts, almonds, and seeds.  Eat more omega-3 fats. Good choices include salmon, mackerel, sardines, tuna, flaxseed oil, and ground flaxseeds. Aim to eat fish at least two times a week.  Limit saturated fats. Saturated fats are primarily found in animal products, such as meats, butter, and cream. Plant sources of saturated fats include palm oil, palm kernel oil, and coconut oil.  Avoid foods with partially hydrogenated oils in them. These contain trans fats.  Examples of foods that contain trans fats are stick margarine, some tub margarines, cookies, crackers, and other baked goods. What general guidelines do I need to follow? These guidelines for healthy eating will help you control your intake of fat and cholesterol:  Check food labels carefully to identify foods with trans fats or high amounts of saturated fat.  Fill one half of your plate with vegetables and green salads.  Fill one fourth of your plate with whole grains. Look for the word "whole" as the first word in the ingredient list.  Fill one fourth of your plate with lean protein foods.  Limit fruit to two servings a day. Choose fruit instead of juice.  Eat more foods that contain fiber, such as apples, broccoli, carrots, beans, peas, and barley.  Eat more home-cooked food and less restaurant, buffet, and fast food.  Limit or avoid alcohol.  Limit foods high in starch and sugar.  Limit fried foods.  Cook foods using methods other than frying. Baking, boiling, grilling, and broiling are all great options.  Lose weight if you are overweight. Losing just 5-10% of your initial body weight can help your overall health and prevent diseases such as diabetes and heart disease.  What foods can I eat? Grains  Whole grains, such as whole wheat or whole grain breads, crackers, cereals, and pasta. Unsweetened oatmeal, bulgur, barley, quinoa, or [place] rice. Corn or whole wheat flour tortillas. Vegetables  Fresh or frozen vegetables (raw, steamed, roasted, or grilled). Green salads. Fruits  All fresh, canned (in natural juice), or frozen fruits. Meats and other protein foods  Ground beef (85% or leaner), grass-fed beef, or  beef trimmed of fat. Skinless chicken or [place]. Ground chicken or [place]. Pork trimmed of fat. All fish and seafood. Eggs. Dried beans, peas, or lentils. Unsalted nuts or seeds. Unsalted canned or dry beans. Dairy  Low-fat dairy products, such as skim or 1% milk,  2% or reduced-fat cheeses, low-fat ricotta or cottage cheese, or plain low-fat yo Fats and oils  Tub margarines without trans fats. Light or reduced-fat mayonnaise and salad dressings. Avocado. Olive, canola, sesame, or safflower oils. Natural peanut or almond butter (choose ones without added sugar and oil). The items listed above may not be a complete list of recommended foods or beverages. Contact your dietitian for more options. Foods to avoid Grains  White bread. White pasta. White rice. Cornbread. Bagels, pastries, and croissants. Crackers that contain trans fat. Vegetables  White potatoes. Corn. Creamed or fried vegetables. Vegetables in a cheese sauce. Fruits  Dried fruits. Canned fruit in light or heavy syrup. Fruit juice. Meats and other protein foods  Fatty cuts of meat. Ribs, chicken wings, bacon, sausage, bologna, salami, chitterlings, fatback, hot dogs, bratwurst, and packaged luncheon meats. Liver and organ meats. Dairy  Whole or 2% milk, cream, half-and-half, and cream cheese. Whole milk cheeses. Whole-fat or sweetened yogurt. Full-fat cheeses. Nondairy creamers and whipped toppings. Processed cheese, cheese spreads, or cheese curds. Beverages  Alcohol. Sweetened drinks (such as sodas, lemonade, and fruit drinks or punches). Fats and oils  Butter, stick margarine, lard, shortening, ghee, or bacon fat. Coconut, palm kernel, or palm oils. Sweets and desserts  Corn syrup, sugars, honey, and molasses. Candy. Jam and jelly. Syrup. Sweetened cereals. Cookies, pies, cakes, donuts, muffins, and ice cream. The items listed above may not be a complete list of foods and beverages to avoid. Contact your dietitian for more information. This information is not intended to replace advice given to you by your health care provider. Make sure you discuss any questions you have with your health care provider. Document Released: 05/30/2005 Document Revised: 06/20/2014 Document Reviewed:  08/28/2013 Elsevier Interactive Patient Education  [employer].

## 2018-01-08 NOTE — Telephone Encounter (Signed)
Called to Follow up with client after his first free clinic appointment last week. Client simply states it was "okay". Client states he is on his way for follow up today and is also to meet with [facility]rollment person after his visit and he states he has all his documents.  Client denies any further needs at this time. Will follow as needed.

## 2018-01-15 ENCOUNTER — Encounter: Payer: Self-pay | Admitting: Physician Assistant

## 2018-01-15 ENCOUNTER — Ambulatory Visit: Payer: Self-pay | Admitting: Physician Assistant

## 2018-01-15 VITALS — BP 100/60 | HR 82 | Temp 98.1°F | Ht 67.5 in | Wt 139.0 lb

## 2018-01-15 DIAGNOSIS — E1165 Type 2 diabetes mellitus with hyperglycemia: Secondary | ICD-10-CM

## 2018-01-15 DIAGNOSIS — S91109S Unspecified open wound of unspecified toe(s) without damage to nail, sequela: Secondary | ICD-10-CM

## 2018-01-15 NOTE — Progress Notes (Signed)
BP 100/60 (BP Location: Left Arm, Patient Position: Sitting, Cuff Size: Normal)   Pulse 82   Temp 98.1 F (36.7 C)   Ht 5' 7.5" (1.715 m)   Wt 139 lb (63 kg)   SpO2 98%   BMI 21.45 kg/m    Subjective:    Patient ID: [name] [name], male    DOB: May 30, 1961, 57 y.o.   MRN: [id number]  HPI: [name] [name] is a 57 y.o. male presenting on 01/15/2018 for Follow-up   HPI   Pt here for recheck of toe wound.  He has finished his antibiotics.  He says he is sometimes checking his blood sugar.  bs 143 yesterday, 156 today   Relevant past medical, surgical, family and social history reviewed and updated as indicated. Interim medical history since our last visit reviewed. Allergies and medications reviewed and updated.   Current Outpatient Medications:  .  acetaminophen (TYLENOL) 500 MG tablet, Take 500 mg by mouth every 6 (six) hours as needed (arthritis). , Disp: , Rfl:  .  atorvastatin (LIPITOR) 20 MG tablet, Take 1 tablet (20 mg total) by mouth daily., Disp: 90 tablet, Rfl: 1 .  Calcium Carbonate-Simethicone (ALKA-SELTZER HEARTBURN + GAS) 750-80 MG CHEW, Chew 1 tablet by mouth daily as needed., Disp: , Rfl:  .  famotidine (PEPCID) 20 MG tablet, Take 1 tablet (20 mg total) by mouth 2 (two) times daily as needed for heartburn or indigestion., Disp: 60 tablet, Rfl: 3 .  lisinopril (PRINIVIL,ZESTRIL) 10 MG tablet, Take 1 tablet (10 mg total) by mouth daily., Disp: 30 tablet, Rfl: 1 .  metFORMIN (GLUCOPHAGE) 1000 MG tablet, Take 1 tablet (1,000 mg total) by mouth 2 (two) times daily with a meal., Disp: 180 tablet, Rfl: 1 .  Omega-3 Fatty Acids (FISH OIL) 1200 MG CAPS, Take 1 capsule (1,200 mg total) by mouth 2 (two) times daily., Disp: , Rfl:  .  sitaGLIPtin (JANUVIA) 100 MG tablet, Take 1 tablet (100 mg total) by mouth daily., Disp: 90 tablet, Rfl: 0   Review of Systems  Constitutional: Positive for fatigue and unexpected weight change. Negative for appetite change, chills, diaphoresis and  fever.  HENT: Positive for congestion. Negative for dental problem, drooling, ear pain, facial swelling, hearing loss, mouth sores, sneezing, sore throat, trouble swallowing and voice change.   Eyes: Positive for visual disturbance. Negative for pain, discharge, redness and itching.  Respiratory: Negative for cough, choking, shortness of breath and wheezing.   Cardiovascular: Negative for chest pain, palpitations and leg swelling.  Gastrointestinal: Negative for abdominal pain, blood in stool, constipation, diarrhea and vomiting.  Endocrine: Negative for cold intolerance, heat intolerance and polydipsia.  Genitourinary: Negative for decreased urine volume, dysuria and hematuria.  Musculoskeletal: Positive for arthralgias and gait problem. Negative for back pain.  Skin: Negative for rash.  Allergic/Immunologic: Negative for environmental allergies.  Neurological: Negative for seizures, syncope, light-headedness and headaches.  Hematological: Negative for adenopathy.  Psychiatric/Behavioral: Negative for agitation, dysphoric mood and suicidal ideas. The patient is not nervous/anxious.     Per HPI unless specifically indicated above     Objective:    BP 100/60 (BP Location: Left Arm, Patient Position: Sitting, Cuff Size: Normal)   Pulse 82   Temp 98.1 F (36.7 C)   Ht 5' 7.5" (1.715 m)   Wt 139 lb (63 kg)   SpO2 98%   BMI 21.45 kg/m   Wt Readings from Last 3 Encounters:  01/15/18 139 lb (63 kg)  01/08/18 141 lb 4 oz (  64.1 kg)  01/01/18 137 lb (62.1 kg)    Physical Exam  Constitutional: He is oriented to person, place, and time. He appears well-developed and well-nourished.  HENT:  Head: Normocephalic and atraumatic.  Pulmonary/Chest: Effort normal. No respiratory distress.  Neurological: He is alert and oriented to person, place, and time.  Skin: Skin is warm and dry.  Psychiatric: He has a normal mood and affect. His behavior is normal.  Nursing note and vitals reviewed.  R  great toe the dark eschar appears to be mostly gone.  There is no pus.  There is no erythema.  The hole in his toe appears to be several mm deep.       Assessment & Plan:   Encounter Diagnoses  Name Primary?  . Open toe wound, sequela Yes  . Uncontrolled type 2 diabetes mellitus with hyperglycemia ([place])     -pt is on list for diabetic eye exam -pt to keep appointment next week with wound clinic -Xeroform applied to toe wound and pt given some to use.  Encouraged him to change dressing daily.  He is to RTO right away for any worsening of the toe or for fever -follow up 1 month to recheck diabetes

## 2018-01-23 ENCOUNTER — Other Ambulatory Visit: Payer: Self-pay | Admitting: Physician Assistant

## 2018-01-24 ENCOUNTER — Other Ambulatory Visit
Admission: RE | Admit: 2018-01-24 | Discharge: 2018-01-24 | Disposition: A | Discharge status: Home / Self Care | Payer: Self-pay | Source: Ambulatory Visit | Attending: Nurse Practitioner | Admitting: Nurse Practitioner

## 2018-01-24 ENCOUNTER — Encounter: Payer: Self-pay | Attending: Nurse Practitioner | Admitting: Nurse Practitioner

## 2018-01-24 DIAGNOSIS — E1140 Type 2 diabetes mellitus with diabetic neuropathy, unspecified: Secondary | ICD-10-CM | POA: Insufficient documentation

## 2018-01-24 DIAGNOSIS — L97516 Non-pressure chronic ulcer of other part of right foot with bone involvement without evidence of necrosis: Secondary | ICD-10-CM | POA: Insufficient documentation

## 2018-01-24 DIAGNOSIS — B999 Unspecified infectious disease: Secondary | ICD-10-CM | POA: Insufficient documentation

## 2018-01-24 DIAGNOSIS — I10 Essential (primary) hypertension: Secondary | ICD-10-CM | POA: Insufficient documentation

## 2018-01-24 DIAGNOSIS — M069 Rheumatoid arthritis, unspecified: Secondary | ICD-10-CM | POA: Insufficient documentation

## 2018-01-24 DIAGNOSIS — E11621 Type 2 diabetes mellitus with foot ulcer: Secondary | ICD-10-CM | POA: Insufficient documentation

## 2018-01-27 LAB — AEROBIC CULTURE  (SUPERFICIAL SPECIMEN)

## 2018-01-27 LAB — AEROBIC CULTURE W GRAM STAIN (SUPERFICIAL SPECIMEN)

## 2018-01-31 ENCOUNTER — Encounter: Payer: Self-pay | Admitting: Internal Medicine

## 2018-02-01 ENCOUNTER — Other Ambulatory Visit: Payer: Self-pay | Admitting: Physician Assistant

## 2018-02-01 MED ORDER — LISINOPRIL 10 MG PO TABS: 10.0000 mg | ORAL_TABLET | Freq: Every day | ORAL | 0 refills | Status: DC

## 2018-02-06 NOTE — Progress Notes (Signed)
JAMARIOUS, FEBO ([id number]) Visit Report for 01/24/2018 Abuse/Suicide Risk Screen Details Patient Name: NEILAN, RIZZO Date of Service: 01/24/2018 12:30 PM Medical Record Number: [id number] Patient Account Number: [id number] Date of Birth/Sex: Mar 20, 1961 (57 y.o. Male) Treating RN: Roger Shelter Primary Care Alexes Menchaca: Soyla Dryer Other Clinician: Referring Darcus Edds: Soyla Dryer Treating Dierks Wach/Extender: Cathie Olden in Treatment: 0 Abuse/Suicide Risk Screen Items Answer ABUSE/SUICIDE RISK SCREEN: Has anyone close to you tried to hurt or harm you recentlyo No Do you feel uncomfortable with anyone in your familyo No Has anyone forced you do things that you didnot want to doo No Do you have any thoughts of harming yourselfo No Patient displays signs or symptoms of abuse and/or neglect. No Electronic Signature(s) Signed: 01/24/2018 3:48:39 PM By: Roger Shelter Entered By: Roger Shelter on 01/24/2018 12:55:02 [address], [place] ([id number]) -------------------------------------------------------------------------------- Activities of Daily Living Details Patient Name: MARICO, BUCKLE Date of Service: 01/24/2018 12:30 PM Medical Record Number: [id number] Patient Account Number: [id number] Date of Birth/Sex: [birth year] (57 y.o. Male) Treating RN: Roger Shelter Primary Care Jaymee Tilson: Soyla Dryer Other Clinician: Referring Jesyca Weisenburger: Soyla Dryer Treating [place] Prout/Extender: Cathie Olden in Treatment: 0 Activities of Daily Living Items Answer Activities of Daily Living (Please select one for each item) Drive Automobile Completely Able Take Medications Completely Able Use Telephone Completely Able Care for Appearance Completely Able Use Toilet Completely Able Bath / Shower Completely Able Dress Self Completely Able Feed Self Completely Able Walk Completely Able Get In / Out Bed Completely [place] for Self Completely Able Electronic Signature(s) Signed: 01/24/2018 3:48:39 PM By: Roger Shelter Entered By: Roger Shelter on 01/24/2018 12:55:27 [place], [place] ([id number]) -------------------------------------------------------------------------------- Education Assessment Details Patient Name: [name] Date of Service: 01/24/2018 12:30 PM Medical Record Number: [id number] Patient Account Number: [id number] Date of Birth/Sex: 04/02/1961 (57 y.o. Male) Treating RN: Roger Shelter Primary Care Nemesio Castrillon: Soyla Dryer Other Clinician: Referring Starasia Sinko: Soyla Dryer Treating Lenvil Swaim/Extender: Cathie Olden in Treatment: 0 Primary Learner Assessed: Patient Learning Preferences/Education Level/Primary Language Highest Education Level: High School Preferred Language: English Cognitive Barrier Assessment/Beliefs Language Barrier: No Translator Needed: No Memory Deficit: No Emotional Barrier: No Cultural/Religious Beliefs Affecting Medical Care: No Physical Barrier Assessment Impaired Vision: Yes Glasses Impaired Hearing: No Decreased Hand dexterity: No Knowledge/Comprehension Assessment Knowledge Level: High Comprehension Level: High Ability to understand written High instructions: Ability to understand verbal High instructions: Motivation Assessment Anxiety Level: Calm Cooperation: Cooperative Education Importance: Acknowledges Need Interest in Health Problems: Asks Questions Perception: Coherent Willingness to Engage in Self- High Management Activities: Readiness to Engage in Self- High Management Activities: Electronic Signature(s) Signed: 01/24/2018 3:48:39 PM By: Roger Shelter Entered By: Roger Shelter on 01/24/2018 12:55:55 [place], [place] ([id number]) -------------------------------------------------------------------------------- Fall Risk Assessment Details Patient Name: [name] Date of Service: 01/24/2018 12:30 PM Medical Record Number: [id number] Patient Account Number: [id number] Date of Birth/Sex: 07-Mar-1961 (57 y.o. Male) Treating RN: Roger Shelter Primary Care Klair Leising: Soyla Dryer Other Clinician: Referring Collier Monica: Soyla Dryer Treating Natane Heward/Extender: Cathie Olden in Treatment: 0 Fall Risk Assessment Items Have you had 2 or more falls in the last 12 monthso 0 No Have you had any fall that resulted in injury in the last 12 monthso 0 No FALL RISK ASSESSMENT: History of falling - immediate or within 3 months 0 No Secondary diagnosis 0 No Ambulatory aid None/bed rest/wheelchair/nurse 0 No Crutches/cane/walker 0 No Furniture 0 No IV Access/Saline Lock 0 No Gait/Training Normal/bed rest/immobile 0 No Weak 0 No Impaired  0 No Mental Status Oriented to own ability 0 No Electronic Signature(s) Signed: 01/24/2018 3:48:39 PM By: Roger Shelter Entered By: Roger Shelter on 01/24/2018 12:56:03 [place], [place] ([id number]) -------------------------------------------------------------------------------- Foot Assessment Details Patient Name: [name] Date of Service: 01/24/2018 12:30 PM Medical Record Number: [id number] Patient Account Number: [id number] Date of Birth/Sex: 01/18/1961 (57 y.o. Male) Treating RN: Roger Shelter Primary Care Thaer Miyoshi: Soyla Dryer Other Clinician: Referring Courtne Lighty: Soyla Dryer Treating Rexanna Louthan/Extender: Cathie Olden in Treatment: 0 Foot Assessment Items Site Locations + = Sensation present, - = Sensation absent, C = Callus, U = Ulcer R = Redness, W = Warmth, M = Maceration, PU = Pre-ulcerative lesion F = Fissure, S = Swelling, D = Dryness Assessment Right: Left: Other Deformity: No No Prior Foot Ulcer: No No Prior Amputation: No No Charcot Joint: No No Ambulatory Status: Ambulatory Without Help Gait: Steady Electronic Signature(s) Signed: 01/24/2018 3:48:39 PM  By: Roger Shelter Entered By: Roger Shelter on 01/24/2018 13:02:38 [place], [place] ([id number]) -------------------------------------------------------------------------------- Nutrition Risk Assessment Details Patient Name: [name] Date of Service: 01/24/2018 12:30 PM Medical Record Number: [id number] Patient Account Number: [id number] Date of Birth/Sex: 05/18/1961 (58 y.o. Male) Treating RN: Roger Shelter Primary Care Zaquan Duffner: Soyla Dryer Other Clinician: Referring Jamine Wingate: Soyla Dryer Treating Tamikia Chowning/Extender: Cathie Olden in Treatment: 0 Height (in): 70 Weight (lbs): 139 Body Mass Index (BMI): 19.9 Nutrition Risk Assessment Items NUTRITION RISK SCREEN: I have an illness or condition that made me change the kind and/or amount of 0 No food I eat I eat fewer than two meals per day 0 No I eat few fruits and vegetables, or milk products 0 No I have three or more drinks of beer, liquor or wine almost every day 0 No I have tooth or mouth problems that make it hard for me to eat 0 No I don't always have enough money to buy the food I need 0 No I eat alone most of the time 0 No I take three or more different prescribed or over-the-counter drugs a day 0 No Without wanting to, I have lost or gained 10 pounds in the last six months 0 No I am not always physically able to shop, cook and/or feed myself 0 No Nutrition Protocols Good Risk Protocol 0 No interventions needed Moderate Risk Protocol Electronic Signature(s) Signed: 01/24/2018 3:48:39 PM By: Roger Shelter Entered By: Roger Shelter on 01/24/2018 12:56:12

## 2018-02-07 ENCOUNTER — Encounter: Payer: Self-pay | Admitting: Internal Medicine

## 2018-02-07 NOTE — Progress Notes (Addendum)
[name] [name], WEIRAUCH ([id number]) Visit Report for 01/24/2018 Chief Complaint Document Details Patient Name: [name] [name], [name] [name] Date of Service: 01/24/2018 12:30 PM Medical Record Number: [id number] Patient Account Number: [id number] Date of Birth/Sex: Jun 12, 1961 (57 y.o. M) Treating RN: Cornell Barman Primary Care Provider: Soyla Dryer Other Clinician: Referring Provider: Soyla Dryer Treating Provider/Extender: Cathie Olden in Treatment: 0 Information Obtained from: Patient Chief Complaint right great toe Electronic Signature(s) Signed: 02/15/2018 6:55:11 PM By: Lawanda Cousins Previous Signature: 01/24/2018 1:46:29 PM Version By: Lawanda Cousins Entered By: Lawanda Cousins on 02/15/2018 18:55:10 [place], [place] ([id number]) -------------------------------------------------------------------------------- Debridement Details Patient Name: [name] [name] Date of Service: 01/24/2018 12:30 PM Medical Record Number: [id number] Patient Account Number: [id number] Date of Birth/Sex: Dec 14, 1960 (56 y.o. M) Treating RN: Cornell Barman Primary Care Provider: Soyla Dryer Other Clinician: Referring Provider: Soyla Dryer Treating Provider/Extender: Cathie Olden in Treatment: 0 Debridement Performed for Wound #1 Right,Plantar Toe Great Assessment: Performed By: Physician Lawanda Cousins, NP Debridement Type: Debridement Severity of Tissue Pre Fat layer exposed Debridement: Pre-procedure Verification/Time Yes - 13:26 Out Taken: Start Time: 13:26 Pain Control: Other : lidocaone 4% Total Area Debrided (L x W): 1 (cm) x 0.7 (cm) = 0.7 (cm) Tissue and other material Viable, Non-Viable, Callus, Subcutaneous, Skin: Dermis debrided: Level: Skin/Subcutaneous Tissue Debridement Description: Excisional Instrument: Curette Bleeding: Minimum Hemostasis Achieved: Pressure End Time: 13:30 Procedural Pain: 0 Post Procedural Pain: 0 Response to Treatment: Procedure was tolerated well Level of  Consciousness: Awake and Alert Post Debridement Measurements of Total Wound Length: (cm) 1 Width: (cm) 0.7 Depth: (cm) 0.7 Volume: (cm) 0.385 Character of Wound/Ulcer Post Debridement: Stable Severity of Tissue Post Debridement: Fat layer exposed Post Procedure Diagnosis Same as Pre-procedure Electronic Signature(s) Signed: 01/24/2018 1:45:49 PM By: Lawanda Cousins Signed: 01/24/2018 5:18:50 PM By: Gretta Cool, BSN, RN, CWS, Kim RN, BSN Entered By: Lawanda Cousins on 01/24/2018 13:45:49 [place], [place] ([id number]) -------------------------------------------------------------------------------- HPI Details Patient Name: [name] [name] Date of Service: 01/24/2018 12:30 PM Medical Record Number: [id number] Patient Account Number: [id number] Date of Birth/Sex: 1960/10/01 (56 y.o. M) Treating RN: Cornell Barman Primary Care Provider: Soyla Dryer Other Clinician: Referring Provider: Soyla Dryer Treating Provider/Extender: Cathie Olden in Treatment: 0 History of Present Illness HPI Description: 01/24/18-He is seen in initial evaluation for right great toe ulcer. He was sent to the emergency room on 7/18 from the nurse clinic office for new-onset diabetes with an A1c of 12.9. He was discharged from the emergency room on metformin, had a right foot x-ray that was negative for osteomyelitis and had a follow-up [facility]. He has been treated twice with antibiotic therapy; bactrim (7/8) and doxycycline (7/18). He is neuropathic. He tolerated debridement; bone is palpable through a thin layer of tissue; there is, what appears to be, resolving erythema and edema; a culture was taken, antibiotics will be prescribed as needed based on sensitivities. Electronic Signature(s) Signed: 02/15/2018 6:54:02 PM By: Lawanda Cousins Previous Signature: 01/24/2018 2:07:24 PM Version By: Lawanda Cousins Previous Signature: 01/24/2018 1:58:58 PM Version By: Lawanda Cousins Entered  By: Lawanda Cousins on 02/15/2018 18:54:02 [place], [place] ([id number]) -------------------------------------------------------------------------------- Physical Exam Details Patient Name: [name] [name] Date of Service: 01/24/2018 12:30 PM Medical Record Number: [id number] Patient Account Number: [id number] Date of Birth/Sex: September 13, 1960 (56 y.o. M) Treating RN: Cornell Barman Primary Care Provider: Soyla Dryer Other Clinician: Referring Provider: Soyla Dryer Treating Provider/Extender: Cathie Olden in Treatment: 0 Respiratory respirations are even and unlabored. clear throughout. Cardiovascular s1 s2 regular rate and rhythm. Marland Kitchen  Musculoskeletal ambulates with no assistive devices. Psychiatric oriented to time, place, person and situation. Electronic Signature(s) Signed: 01/24/2018 2:08:03 PM By: Lawanda Cousins Entered By: Lawanda Cousins on 01/24/2018 [place], [place] ([id number]) -------------------------------------------------------------------------------- Physician Orders Details Patient Name: [name] [name] Date of Service: 01/24/2018 12:30 PM Medical Record Number: [id number] Patient Account Number: [id number] Date of Birth/Sex: 01-12-1961 (56 y.o. 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 Anesthetic (add to Medication List) Wound #1 Right,Plantar Toe Great o Topical Lidocaine 4% cream applied to wound bed prior to debridement (In  Clinic Only). Primary Wound Dressing Wound #1 Right,Plantar Toe Great o Silver Collagen Secondary Dressing o Gauze and Kerlix/Conform Dressing Change Frequency Wound #1 Right,Plantar Toe Great o Change Dressing Monday, Wednesday, Friday Follow-up Appointments Wound #1 Right,Plantar Toe Great o Return Appointment in 1 week. Off-Loading Wound #1 Right,Plantar Toe Great o Open toe surgical shoe with peg assist. Additional Orders / Instructions Wound #1 Right,Plantar Toe Great o Increase protein intake. Laboratory o Bacteria identified in Wound by Culture (MICRO) - Right great toe oooo LOINC Code: 7425-9 CHAYSE, ZATARAIN ([id number]) oooo Convenience Name: Wound culture routine Patient Medications Allergies: No Known Allergies Notifications Medication Indication Start End lidocaine DOSE topical 4 % cream - cream topical Augmentin 01/29/2018 DOSE 1 - oral 875 mg-125 mg tablet - 1 tablet oral taken by mouth 2 times a day x 10 days taken with Bactrim Cipro DOSE 1 - oral 500 mg tablet - 1 tablet oral taken 2 times a day for 10 days taken with Augmentin Electronic Signature(s) Signed: 02/21/2018 4:10:33 PM By: Lawanda Cousins Previous Signature: 01/29/2018 9:58:11 AM Version By: Worthy Keeler PA-C Previous Signature: 01/30/2018 9:18:16 PM Version By: Lawanda Cousins Previous Signature: 01/24/2018 5:10:54 PM Version By: Lawanda Cousins Previous Signature: 01/24/2018 5:18:50 PM Version By: Gretta Cool, BSN, RN, CWS, Kim RN, BSN Entered By: Lawanda Cousins on 02/15/2018 18:55:44 [place], [place] ([id number]) -------------------------------------------------------------------------------- Prescription 01/24/2018 Patient Name: [name] [name] Provider: Lawanda Cousins NP Date of Birth: 15-Jun-1960 NPI#: [id number] Sex: M DEA#: [id number] Phone #: [phone] License #: Patient Address: [facility] [place], [place]  [address] [address], [place], [place] [address] [phone] Allergies No Known Allergies Medication Medication: Route: Strength: Form: lidocaine 4 % topical cream topical 4% cream Class: TOPICAL LOCAL ANESTHETICS Dose: Frequency / Time: Indication: cream topical Number of Refills: Number of Units: 0 Generic Substitution: Start Date: End Date: One Time Use: Substitution Permitted No Note to Pharmacy: Signature(s): Date(s): Electronic Signature(s) Signed: 02/21/2018 4:10:33 PM By: Lawanda Cousins Previous Signature: 01/29/2018 6:12:43 PM Version By: Worthy Keeler PA-C Previous Signature: 01/30/2018 9:18:16 PM Version By: Lawanda Cousins Previous Signature: 01/24/2018 5:10:54 PM Version By: Lawanda Cousins Previous Signature: 01/24/2018 5:18:50 PM Version By: Gretta Cool, BSN, RN, CWS, Kim RN, BSN Entered By: Lawanda Cousins on 02/15/2018 18:55:45 [place], [place] ([id number]) [place], [place] ([id number]) --------------------------------------------------------------------------------  Problem List Details Patient Name: [name] [name] Date of Service: 01/24/2018 12:30 PM Medical Record Number: [id number] Patient Account Number: [id number] Date of Birth/Sex: 22-Apr-1961 (57 y.o. M)  Treating RN: Cornell Barman Primary Care Provider: Soyla Dryer Other Clinician: Referring Provider: Soyla Dryer Treating Provider/Extender: Cathie Olden in Treatment: 0 Active Problems ICD-10 Evaluated Encounter Code Description Active Date Today Diagnosis L97.516 Non-pressure chronic ulcer of other part of right foot with 01/24/2018 No Yes bone involvement without evidence of necrosis E11.621 Type 2 diabetes mellitus with foot ulcer 01/24/2018 No Yes E11.40 Type 2 diabetes mellitus with diabetic neuropathy, 01/24/2018 No Yes unspecified Inactive Problems Resolved Problems Electronic Signature(s) Signed: 01/24/2018 2:08:30 PM By: Lawanda Cousins Previous Signature: 01/24/2018 1:38:02 PM  Version By: Lawanda Cousins Entered By: Lawanda Cousins on 01/24/2018 14:08:30 [place], [place] ([id number]) -------------------------------------------------------------------------------- Progress Note Details Patient Name: [name] [name] Date of Service: 01/24/2018 12:30 PM Medical Record Number: [id number] Patient Account Number: [id number] Date of Birth/Sex: [birth year] (56 y.o. M) Treating RN: Cornell Barman Primary Care Provider: Soyla Dryer Other Clinician: Referring Provider: Soyla Dryer Treating Provider/Extender: Cathie Olden in Treatment: 0 Subjective Chief Complaint Information obtained from Patient right great toe History of Present Illness (HPI) 01/24/18-He is seen in initial evaluation for right great toe ulcer. He was sent to the emergency room on 7/18 from the nurse clinic office for new-onset diabetes with an A1c of 12.9. He was discharged from the emergency room on metformin, had a right foot x-ray that was negative for osteomyelitis and had a follow-up with [facility] on 7/[facility]. He has been treated twice with antibiotic therapy; bactrim (7/8) and doxycycline (7/18). He is neuropathic. He tolerated debridement; bone is palpable through a thin layer of tissue; there is, what appears to be, resolving erythema and edema; a culture was taken, antibiotics will be prescribed as needed based on sensitivities. Wound History Patient presents with 1 open wound that has been present for approximately 1 month. Patient has been treating wound in the following manner: vaseline patch. Laboratory tests have not been performed in the last month. Patient reportedly has not tested positive for an antibiotic resistant organism. Patient reportedly has not tested positive for osteomyelitis. Patient reportedly has not had testing performed to evaluate circulation in the legs. Patient experiences the following problems associated with their wounds:  infection. Patient History Allergies No Known Allergies Family History Cancer - Father, Diabetes - Mother, Hypertension - Mother, No family history of Kidney Disease, Lung Disease, Seizures, Stroke, Thyroid Problems, Tuberculosis. Social History Never smoker, Marital Status - Single, Alcohol Use - Never, Drug Use - No History, Caffeine Use - Never. Medical History Eyes Patient has history of Cataracts - removed bilateral Denies history of Glaucoma, Optic Neuritis Ear/Nose/Mouth/Throat Denies history of Chronic sinus problems/congestion, Middle ear problems Hematologic/Lymphatic Denies history of Anemia, Hemophilia, Human Immunodeficiency Virus, Lymphedema, Sickle Cell Disease Respiratory Denies history of Aspiration, Asthma, Chronic Obstructive Pulmonary Disease (COPD), Pneumothorax, Sleep Apnea, Tuberculosis Cardiovascular Patient has history of Hypertension Denies history of Angina, Arrhythmia, Congestive Heart Failure, Coronary Artery Disease, Deep Vein Thrombosis, [place], [place] ([id number]) Hypotension, Myocardial Infarction, Peripheral Arterial Disease, Peripheral Venous Disease, Phlebitis, Vasculitis Gastrointestinal Denies history of Cirrhosis , Colitis, Crohn s, Hepatitis A, Hepatitis B, Hepatitis C Endocrine Patient has history of Type II Diabetes Genitourinary Denies history of End Stage Renal Disease Immunological Denies history of Lupus Erythematosus, Raynaud s, Scleroderma Integumentary (Skin) Denies history of History of Burn, History of pressure wounds Musculoskeletal Patient has history of Rheumatoid Arthritis Denies history of Gout, Osteoarthritis, Osteomyelitis Neurologic Patient has history of Neuropathy Denies history of Dementia, Quadriplegia, Paraplegia, Seizure Disorder Psychiatric Denies history of Anorexia/bulimia, Confinement Anxiety  Patient is treated with Controlled Diet, Oral Agents. Blood sugar is tested. Blood sugar results noted at the  following times: Breakfast - 163. Review of Systems (ROS) Constitutional Symptoms (General Health) Denies complaints or symptoms of Fatigue, Fever, Chills, Marked Weight Change. Eyes Complains or has symptoms of Dry Eyes, Glasses / Contacts - glasses. Ear/Nose/Mouth/Throat Denies complaints or symptoms of Difficult clearing ears, Sinusitis. Hematologic/Lymphatic Denies complaints or symptoms of Bleeding / Clotting Disorders, Human Immunodeficiency Virus. Respiratory Denies complaints or symptoms of Chronic or frequent coughs, Shortness of Breath. Cardiovascular Denies complaints or symptoms of Chest pain, LE edema. Gastrointestinal Denies complaints or symptoms of Frequent diarrhea, Nausea, Vomiting. Endocrine Denies complaints or symptoms of Hepatitis, Thyroid disease, Polydypsia (Excessive Thirst). Genitourinary Denies complaints or symptoms of Kidney failure/ Dialysis, Incontinence/dribbling. Immunological Denies complaints or symptoms of Hives, Itching. Integumentary (Skin) Complains or has symptoms of Wounds. Denies complaints or symptoms of Bleeding or bruising tendency, Breakdown, Swelling. Musculoskeletal Denies complaints or symptoms of Muscle Pain, Muscle Weakness. Neurologic Denies complaints or symptoms of Numbness/parasthesias, Focal/Weakness. Oncologic The patient has no complaints or symptoms. Psychiatric Denies complaints or symptoms of Anxiety, Claustrophobia. [place], [place] ([id number]) Objective Constitutional Vitals Time Taken: 12:47 PM, Height: 70 in, Source: Stated, Weight: 139 lbs, Source: Measured, BMI: 19.9, Temperature: 98.3 F, Pulse: 81 bpm, Respiratory Rate: 18 breaths/min, Blood Pressure: 133/80 mmHg. Respiratory respirations are even and unlabored. clear throughout. Cardiovascular s1 s2 regular rate and rhythm. Musculoskeletal ambulates with no assistive devices. Psychiatric oriented to time, place, person and situation. Integumentary  (Hair, Skin) Wound #1 status is Open. Original cause of wound was Gradually Appeared. The wound is located on the [employer]. The wound measures 1cm length x 0.7cm width x 0.6cm depth; 0.55cm^2 area and 0.33cm^3 volume. There is Fat Layer (Subcutaneous Tissue) Exposed exposed. There is no tunneling noted, however, there is undermining starting at 6:00 and ending at 12:00 with a maximum distance of 0.6cm. There is a large amount of serosanguineous drainage noted. The wound margin is distinct with the outline attached to the wound base. There is small (1-33%) red, pink granulation within the wound bed. There is a large (67-100%) amount of necrotic tissue within the wound bed including Eschar. The periwound skin appearance exhibited: Callus. The periwound skin appearance did not exhibit: Crepitus, Excoriation, Induration, Rash, Scarring, Dry/Scaly, Maceration, Atrophie Blanche, Cyanosis, Ecchymosis, Hemosiderin Staining, Mottled, Pallor, Rubor, Erythema. Periwound temperature was noted as No Abnormality. 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 There was a Excisional Skin/Subcutaneous Tissue Debridement with a total area of 0.7 sq cm performed by Lawanda Cousins, NP. With the following instrument(s): Curette to [place], [place] ([id number]) remove Viable and Non-Viable tissue/material. Material removed includes Callus, Subcutaneous Tissue, and Skin: Dermis after achieving pain control using Other (lidocaone 4%). No specimens were taken. A time out was conducted at 13:26, prior to the start  of the procedure. A Minimum amount of bleeding was controlled with Pressure. The procedure was tolerated well with a pain level of 0 throughout and a pain level of 0 following the procedure. Patient s Level of Consciousness post procedure was recorded as Awake and Alert. Post Debridement Measurements: 1cm length  x 0.7cm width x 0.7cm depth; 0.385cm^3 volume. Character of Wound/Ulcer Post Debridement is stable. Severity of Tissue Post Debridement is: Fat layer exposed. Post procedure Diagnosis Wound #1: Same as Pre-Procedure Plan Wound Cleansing: Wound #1 Right,Plantar Toe Great: Clean wound with Normal Saline. Anesthetic (add to Medication List): Wound #1 Right,Plantar Toe Great: Topical Lidocaine 4% cream applied to wound bed prior to debridement (In Clinic Only). Primary Wound Dressing: Wound #1 Right,Plantar Toe Great: Silver Collagen Secondary Dressing: Gauze and Kerlix/Conform Dressing Change Frequency: Wound #1 Right,Plantar Toe Great: Change Dressing Monday, Wednesday, Friday Follow-up Appointments: Wound #1 Right,Plantar Toe Great: Return Appointment in 1 week. Off-Loading: Wound #1 Right,Plantar Toe Great: Open toe surgical shoe with peg assist. Additional Orders / Instructions: Wound #1 Right,Plantar Toe Great: Increase protein intake. Laboratory ordered were: Wound culture routine - Right great toe The following medication(s) was prescribed: lidocaine topical 4 % cream cream topical was prescribed at facility Augmentin oral 875 mg-125 mg tablet 1 1 tablet oral taken by mouth 2 times a day x 10 days taken with Bactrim starting 01/29/2018 Cipro oral 500 mg tablet 1 1 tablet oral taken 2 times a day for 10 days taken with Augmentin Electronic Signature(s) Signed: 02/15/2018 6:56:03 PM By: Lawanda Cousins Previous Signature: 01/24/2018 2:08:43 PM Version By: Lawanda Cousins Entered By: Lawanda Cousins on 02/15/2018 18:56:03 [place], [place]  ([id number]) -------------------------------------------------------------------------------- ROS/PFSH Details Patient Name: [name] [name] Date of Service: 01/24/2018 12:30 PM Medical Record Number: [id number] Patient Account Number: [id number] Date of Birth/Sex: 1961-04-17 (57 y.o. M) Treating RN: Roger Shelter Primary Care Provider: Soyla Dryer Other Clinician: Referring Provider: Soyla Dryer Treating Provider/Extender: Cathie Olden in Treatment: 0 Wound History Do you currently have one or more open woundso Yes How many open wounds do you currently haveo 1 Approximately how long have you had your woundso 1 month How have you been treating your wound(s) until nowo vaseline patch Has your wound(s) ever healed and then re-openedo No Have you had any lab work done in the past montho No Have you tested positive for an antibiotic resistant organism (MRSA, VRE)o No Have you tested positive for osteomyelitis (bone infection)o No Have you had any tests for circulation on your legso No Have you had other problems associated with your woundso Infection Constitutional Symptoms (General Health) Complaints and Symptoms: Negative for: Fatigue; Fever; Chills; Marked Weight Change Eyes Complaints and Symptoms: Positive for: Dry Eyes; Glasses / Contacts - glasses Medical History: Positive for: Cataracts - removed bilateral Negative for: Glaucoma; Optic Neuritis Ear/Nose/Mouth/Throat Complaints and Symptoms: Negative for: Difficult clearing ears; Sinusitis Medical History: Negative for: Chronic sinus problems/congestion; Middle ear problems Hematologic/Lymphatic Complaints and Symptoms: Negative for: Bleeding / Clotting Disorders; Human Immunodeficiency Virus Medical History: Negative for: Anemia; Hemophilia; Human Immunodeficiency Virus; Lymphedema; Sickle Cell Disease Respiratory Complaints and Symptoms: Negative for: Chronic or frequent coughs; Shortness of  Breath Medical History: Negative for: Aspiration; Asthma; Chronic Obstructive Pulmonary Disease (COPD); Pneumothorax; Sleep Apnea; [place], [place] ([id number]) Tuberculosis Cardiovascular Complaints and Symptoms: Negative for: Chest pain; LE edema Medical History: Positive for: Hypertension Negative for: Angina; Arrhythmia; Congestive Heart Failure; Coronary Artery Disease; Deep Vein Thrombosis; Hypotension; Myocardial Infarction; Peripheral Arterial Disease; Peripheral Venous Disease; Phlebitis; Vasculitis Gastrointestinal Complaints and Symptoms: Negative for: Frequent diarrhea; Nausea; Vomiting Medical History: Negative for: Cirrhosis ; Colitis; Crohnos; Hepatitis A; Hepatitis B; Hepatitis C Endocrine Complaints and Symptoms: Negative for: Hepatitis; Thyroid disease; Polydypsia (Excessive Thirst) Medical History: Positive for: Type II Diabetes Time with diabetes: 1 week ago Treated with: Oral  agents, Diet Blood sugar tested every day: Yes Tested : once in am Blood sugar testing results: Breakfast: 163 Genitourinary Complaints and Symptoms: Negative for: Kidney failure/ Dialysis; Incontinence/dribbling Medical History: Negative for: End Stage Renal Disease Immunological Complaints and Symptoms: Negative for: Hives; Itching Medical History: Negative for: Lupus Erythematosus; Raynaudos; Scleroderma Integumentary (Skin) Complaints and Symptoms: Positive for: Wounds Negative for: Bleeding or bruising tendency; Breakdown; Swelling Medical History: Negative for: History of Burn; History of pressure wounds [place], [place] ([id number]) Musculoskeletal Complaints and Symptoms: Negative for: Muscle Pain; Muscle Weakness Medical History: Positive for: Rheumatoid Arthritis Negative for: Gout; Osteoarthritis; Osteomyelitis Neurologic Complaints and Symptoms: Negative for: Numbness/parasthesias; Focal/Weakness Medical History: Positive for: Neuropathy Negative for: Dementia;  Quadriplegia; Paraplegia; Seizure Disorder Psychiatric Complaints and Symptoms: Negative for: Anxiety; Claustrophobia Medical History: Negative for: Anorexia/bulimia; Confinement Anxiety Oncologic Complaints and Symptoms: No Complaints or Symptoms HBO Extended History Items Eyes: Cataracts Immunizations Pneumococcal Vaccine: Received Pneumococcal Vaccination: No Implantable Devices Family and Social History Cancer: Yes - Father; Diabetes: Yes - Mother; Hypertension: Yes - Mother; Kidney Disease: No; Lung Disease: No; Seizures: No; Stroke: No; Thyroid Problems: No; Tuberculosis: No; Never smoker; Marital Status - Single; Alcohol Use: Never; Drug Use: No History; Caffeine Use: Never; Financial Concerns: No; Food, Clothing or Shelter Needs: No; Support System Lacking: No; Transportation Concerns: No; Advanced Directives: No; Patient does not want information on Advanced Directives; Do not resuscitate: No; Living Will: No; Medical Power of Attorney: No Electronic Signature(s) Signed: 01/24/2018 3:48:39 PM By: Roger Shelter Signed: 01/24/2018 5:10:54 PM By: Lawanda Cousins Entered By: Roger Shelter on 01/24/2018 12:54:51 [place], [place] ([id number]) -------------------------------------------------------------------------------- SuperBill Details Patient Name: [name] [name] Date of Service: 01/24/2018 Medical Record Number: [id number] Patient Account Number: [id number] Date of Birth/Sex: [birth year] (56 y.o. 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 E11.621 Type 2 diabetes mellitus with foot ulcer E11.40 Type 2 diabetes mellitus with diabetic neuropathy, unspecified Facility  Procedures CPT4: Description Modifier Quantity Code 97741423 99213 - WOUND CARE VISIT-LEV 3 EST PT 1 CPT4: 95320233 11042 - DEB SUBQ TISSUE 20 SQ CM/< 1 ICD-10 Diagnosis Description 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 Physician Procedures CPT4: Description Modifier Quantity Code 4356861 WC PHYS LEVEL 3 o NEW PT 1 ICD-10 Diagnosis Description 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

## 2018-02-08 NOTE — Progress Notes (Addendum)
[name], [name] ([id number]) Visit Report for 01/24/2018 Allergy List Details Patient Name: [name] [name], [name] [name] Date of Service: 01/24/2018 12:30 PM Medical Record Number: [id number] Patient Account Number: [id number] Date of Birth/Sex: 11/07/1960 (57 y.o. M) Treating RN: Roger Shelter Primary Care Tunya Held: Soyla Dryer Other Clinician: Referring Cornellius Kropp: Soyla Dryer Treating Meridee Branum/Extender: Lawanda Cousins Weeks in Treatment: 0 Allergies Active Allergies No Known Allergies Allergy Notes Electronic Signature(s) Signed: 01/24/2018 3:48:39 PM By: Roger Shelter Entered By: Roger Shelter on 01/24/2018 12:47:48 Rhein, Jo ([id number]) -------------------------------------------------------------------------------- Arrival Information Details Patient Name: [name] [name] Date of Service: 01/24/2018 12:30 PM Medical Record Number: [id number] Patient Account Number: [id number] Date of Birth/Sex: 11/23/1960 (56 y.o. M) Treating RN: Roger Shelter Primary Care Cathy Ropp: Soyla Dryer Other Clinician: Referring Elian Gloster: Soyla Dryer Treating Hatim Homann/Extender: Cathie Olden in Treatment: 0 Visit Information Patient Arrived: Ambulatory Arrival Time: 12:45 Accompanied By: self Transfer Assistance: None Patient Identification Verified: Yes Secondary Verification Process Completed: Yes Electronic Signature(s) Signed: 01/24/2018 3:48:39 PM By: Roger Shelter Entered By: Roger Shelter on 01/24/2018 12:46:19 [place], [place] ([id number]) -------------------------------------------------------------------------------- Clinic Level of Care Assessment Details Patient Name: [name], [name] Date of Service: 01/24/2018 12:30 PM Medical Record Number: [id number] Patient Account Number: [id number] Date of Birth/Sex: Dec 16, 1960 (57 y.o. M) Treating RN: Cornell Barman Primary Care Shontelle Muska: Soyla Dryer Other Clinician: Referring Aquinnah Devin: Soyla Dryer Treating  Crystal Scarberry/Extender: Cathie Olden in Treatment: 0 Clinic Level of Care Assessment Items TOOL 1 Quantity Score []  - Use when EandM and Procedure is performed on INITIAL visit 0 ASSESSMENTS - Nursing Assessment / Reassessment X - General Physical Exam (combine w/ comprehensive assessment (listed just below) when 1 20 performed on new pt. evals) X- 1 25 Comprehensive Assessment (HX, ROS, Risk Assessments, Wounds Hx, etc.) ASSESSMENTS - Wound and Skin Assessment / Reassessment []  - Dermatologic / Skin Assessment (not related to wound area) 0 ASSESSMENTS - Ostomy and/or Continence Assessment and Care []  - Incontinence Assessment and Management 0 []  - 0 Ostomy Care Assessment and Management (repouching, etc.) PROCESS - Coordination of Care X - Simple Patient / Family Education for ongoing care 1 15 []  - 0 Complex (extensive) Patient / Family Education for ongoing care []  - 0 Staff obtains Programmer, systems, Records, Test Results / Process Orders []  - 0 Staff telephones HHA, Nursing Homes / Clarify orders / etc []  - 0 Routine Transfer to another Facility (non-emergent condition) []  - 0 Routine Hospital Admission (non-emergent condition) X- 1 15 New Admissions / Biomedical engineer / Ordering NPWT, Apligraf, etc. []  - 0 Emergency Hospital Admission (emergent condition) PROCESS - Special Needs []  - Pediatric / Minor Patient Management 0 []  - 0 Isolation Patient Management []  - 0 Hearing / Language / Visual special needs []  - 0 Assessment of Community assistance (transportation, D/C planning, etc.) []  - 0 Additional assistance / Altered mentation []  - 0 Support Surface(s) Assessment (bed, cushion, seat, etc.) [place], Nayan ([id number]) INTERVENTIONS - Miscellaneous []  - External ear exam 0 []  - 0 Patient Transfer (multiple staff / Civil Service fast streamer / Similar devices) []  - 0 Simple Staple / Suture removal (25 or less) []  - 0 Complex Staple / Suture removal (26 or more) []  -  0 Hypo/Hyperglycemic Management (do not check if billed separately) X- 1 15 Ankle / Brachial Index (ABI) - do not check if billed separately Has the patient been seen at the hospital within the last three years: Yes Total Score: 90 Level Of Care: New/Established - Level 3 Electronic Signature(s) Signed: 01/24/2018 5:18:50 PM By: Gretta Cool, BSN, RN, CWS,  Maudie Mercury RN, BSN Entered By: Gretta Cool, BSN, RN, CWS, Kim on 01/24/2018 13:33:20 [place], [place] ([id number]) -------------------------------------------------------------------------------- Encounter Discharge Information Details Patient Name: [name] [name] Date of Service: 01/24/2018 12:30 PM Medical Record Number: [id number] Patient Account Number: [id number] Date of Birth/Sex: 05-09-1961 (57 y.o. M) Treating RN: Montey Hora Primary Care Brandilee Pies: Soyla Dryer Other Clinician: Referring Ashauna Bertholf: Soyla Dryer Treating Abrham Maslowski/Extender: Cathie Olden in Treatment: 0 Encounter Discharge Information Items Discharge Condition: Stable Ambulatory Status: Ambulatory Discharge Destination: Home Transportation: Private Auto Accompanied By: self Schedule Follow-up Appointment: Yes Clinical Summary of Care: Electronic Signature(s) Signed: 01/24/2018 2:52:52 PM By: Montey Hora Entered By: Montey Hora on 01/24/2018 14:52:52 [place], Jobani ([id number]) -------------------------------------------------------------------------------- Lower Extremity Assessment Details Patient Name: [name] [name] Date of Service: 01/24/2018 12:30 PM Medical Record Number: [id number] Patient Account Number: [id number] Date of Birth/Sex: 10-11-1960 (56 y.o. M) Treating RN: Roger Shelter Primary Care Jaymes Revels: Soyla Dryer Other Clinician: Referring Demarr Kluever: Soyla Dryer Treating Rihan Schueler/Extender: Cathie Olden in Treatment: 0 Edema Assessment Assessed: [Left: No] [Right: No] Edema: [Left: N] [Right: o] Calf Left: Right: Point of  Measurement: 36 cm From Medial Instep cm 31.5 cm Ankle Left: Right: Point of Measurement: 14 cm From Medial Instep cm 21 cm Vascular Assessment Claudication: Claudication Assessment [Right:None] Pulses: Dorsalis Pedis Palpable: [Right:Yes] Doppler Audible: [Right:Yes] Posterior Tibial Extremity colors, hair growth, and conditions: Extremity Color: [Right:Normal] Blood Pressure: Brachial: [Left:164] [Right:133] Dorsalis Pedis: 170 [Left:Dorsalis Pedis: 178] Ankle: Posterior Tibial: 174 [Left:Posterior Tibial: 180 1.06] [Right:1.10] Electronic Signature(s) Signed: 01/24/2018 3:48:39 PM By: Roger Shelter Entered By: Roger Shelter on 01/24/2018 13:14:40 [place], [place] ([id number]) -------------------------------------------------------------------------------- Multi Wound Chart Details Patient Name: [name] [name] Date of Service: 01/24/2018 12:30 PM Medical Record Number: [id number] Patient Account Number: [id number] Date of Birth/Sex: [birth year] (57 y.o. M) Treating RN: Cornell Barman Primary Care Anna Beaird: Soyla Dryer Other Clinician: Referring Soniyah Mcglory: Soyla Dryer Treating Leopoldo Mazzie/Extender: Cathie Olden in Treatment: 0 Vital Signs Height(in): 70 Pulse(bpm): 39 Weight(lbs): 139 Blood Pressure(mmHg): 133/80 Body Mass Index(BMI): 20 Temperature(F): 98.3 Respiratory Rate 18 (breaths/min): Photos: [1:No Photos] [N/A:N/A] Wound Location: [1:Right Toe Great - Plantar] [N/A:N/A] Wounding Event: [1:Gradually Appeared] [N/A:N/A] Primary Etiology: [1:Diabetic Wound/Ulcer of the Lower Extremity] [N/A:N/A] Comorbid History: [1:Cataracts, Hypertension, Type II Diabetes, Rheumatoid Arthritis, Neuropathy] [N/A:N/A] Date Acquired: [1:12/24/2017] [N/A:N/A] Weeks of Treatment: [1:0] [N/A:N/A] Wound Status: [1:Open] [N/A:N/A] Measurements L x W x D [1:1x0.7x0.6] [N/A:N/A] (cm) Area (cm) : [1:0.55] [N/A:N/A] Volume (cm) : [1:0.33] [N/A:N/A] Starting Position  1 [1:6] (o'clock): Ending Position 1 [1:12] (o'clock): Maximum Distance 1 (cm): [1:0.6] Undermining: [1:Yes] [N/A:N/A] Classification: [1:Grade 2] [N/A:N/A] Exudate Amount: [1:Large] [N/A:N/A] Exudate Type: [1:Serosanguineous] [N/A:N/A] Exudate Color: [1:red, [place]] [N/A:N/A] Wound Margin: [1:Distinct, outline attached] [N/A:N/A] Granulation Amount: [1:Small (1-33%)] [N/A:N/A] Granulation Quality: [1:Red, Pink] [N/A:N/A] Necrotic Amount: [1:Large (67-100%)] [N/A:N/A] Necrotic Tissue: [1:Eschar] [N/A:N/A] Exposed Structures: [1:Fat Layer (Subcutaneous Tissue) Exposed: Yes Fascia: No Tendon: No Muscle: No Joint: No Bone: No] [N/A:N/A] Epithelialization: None N/A N/A Debridement: Debridement - Excisional N/A N/A Pre-procedure 13:26 N/A N/A Verification/Time Out Taken: Pain Control: Other N/A N/A Tissue Debrided: Callus, Subcutaneous N/A N/A Level: Skin/Subcutaneous Tissue N/A N/A Debridement Area (sq cm): 0.7 N/A N/A Instrument: Curette N/A N/A Bleeding: Minimum N/A N/A Hemostasis Achieved: Pressure N/A N/A Procedural Pain: 0 N/A N/A Post Procedural Pain: 0 N/A N/A Debridement Treatment Procedure was tolerated well N/A N/A Response: Post Debridement 1x0.7x0.7 N/A N/A Measurements L x W x D (cm) Post Debridement Volume: 0.385 N/A N/A (cm) Periwound Skin Texture: Callus: Yes N/A N/A Excoriation: No Induration: No Crepitus:  No Rash: No Scarring: No Periwound Skin Moisture: Maceration: No N/A N/A Dry/Scaly: No Periwound Skin Color: Atrophie Blanche: No N/A N/A Cyanosis: No Ecchymosis: No Erythema: No Hemosiderin Staining: No Mottled: No Pallor: No Rubor: No Temperature: No Abnormality N/A N/A Tenderness on Palpation: No N/A N/A Wound Preparation: Ulcer Cleansing: N/A N/A Rinsed/Irrigated with Saline Topical Anesthetic Applied: Other: lidocaine 4% Procedures Performed: Debridement N/A N/A Treatment Notes Electronic Signature(s) Signed: 01/24/2018 1:38:09 PM  By: Lawanda Cousins Entered By: Lawanda Cousins on 01/24/2018 13:38:09 [place], Katrina ([id number]) -------------------------------------------------------------------------------- [place] Details Patient Name: [name] [name] Date of Service: 01/24/2018 12:30 PM Medical Record Number: [id number] Patient Account Number: [id number] Date of Birth/Sex: 07/05/1960 (57 y.o. M) Treating RN: Cornell Barman Primary Care Fergie Sherbert: Soyla Dryer Other Clinician: Referring Zyrion Coey: Soyla Dryer Treating Dewey Neukam/Extender: Cathie Olden in Treatment: 0 Active Inactive ` Necrotic Tissue Nursing Diagnoses: Knowledge deficit related to management of necrotic/devitalized tissue Goals: Necrotic/devitalized tissue will be minimized in the wound bed Date Initiated: 01/24/2018 Target Resolution Date: 02/09/2018 Goal Status: Active Interventions: Assess patient pain level pre-, during and post procedure and prior to discharge Treatment Activities: Excisional debridement : 01/24/2018 Notes: ` Orientation to the Wound Care Program Nursing Diagnoses: Knowledge deficit related to the wound healing center program Goals: Patient/caregiver will verbalize understanding of the [place] Date Initiated: 01/24/2018 Target Resolution Date: 02/09/2018 Goal Status: Active Interventions: Provide education on orientation to the wound center Notes: ` Pressure Nursing Diagnoses: Knowledge deficit related to management of pressures ulcers Goals: Patient will remain free from development of additional pressure ulcers Date Initiated: 01/24/2018 Target Resolution Date: 02/09/2018 RUBIN, DAIS ([id number]) Goal Status: Active Interventions: Assess potential for pressure ulcer upon admission and as needed Notes: ` Wound/Skin Impairment Nursing Diagnoses: Knowledge deficit related to ulceration/compromised skin integrity Goals: Ulcer/skin breakdown will have a volume  reduction of 30% by week 4 Date Initiated: 01/24/2018 Target Resolution Date: 02/24/2018 Goal Status: Active Interventions: Assess ulceration(s) every visit Treatment Activities: Topical wound management initiated : 01/24/2018 Notes: Electronic Signature(s) Signed: 01/24/2018 5:18:50 PM By: Gretta Cool, BSN, RN, CWS, Kim RN, BSN Entered By: Gretta Cool, BSN, RN, CWS, Kim on 01/24/2018 13:25:32 [place], [place] ([id number]) -------------------------------------------------------------------------------- Pain Assessment Details Patient Name: [name] [name] Date of Service: 01/24/2018 12:30 PM Medical Record Number: [id number] Patient Account Number: [id number] Date of Birth/Sex: 10-11-1960 (56 y.o. M) Treating RN: Roger Shelter Primary Care Haylen Shelnutt: Soyla Dryer Other Clinician: Referring Jaylnn Ullery: Soyla Dryer Treating Witt Plitt/Extender: Cathie Olden in Treatment: 0 Active Problems Location of Pain Severity and Description of Pain Patient Has Paino No Site Locations Pain Management and Medication Current Pain Management: Electronic Signature(s) Signed: 01/24/2018 3:48:39 PM By: Roger Shelter Entered By: Roger Shelter on 01/24/2018 58:52:77 [place], [place] ([id number]) -------------------------------------------------------------------------------- Patient/Caregiver Education Details Patient Name: [name] [name] Date of Service: 01/24/2018 12:30 PM Medical Record Number: [id number] Patient Account Number: [id number] Date of Birth/Gender: 05/15/1961 (56 y.o. M) Treating RN: Montey Hora Primary Care Physician: Soyla Dryer Other Clinician: Referring Physician: Soyla Dryer Treating Physician/Extender: Cathie Olden in Treatment: 0 Education Assessment Education Provided To: Patient Education Topics Provided Wound/Skin Impairment: Handouts: Other: wound care as ordered Methods: Demonstration, Explain/Verbal Responses: State content correctly Electronic  Signature(s) Signed: 01/25/2018 4:33:06 PM By: Montey Hora Entered By: Montey Hora on 01/24/2018 14:53:21 [place], Dyllin ([id number]) -------------------------------------------------------------------------------- Wound Assessment Details Patient Name: [name] [name] Date of Service: 01/24/2018 12:30 PM Medical Record Number: [id number] Patient Account Number: [id number] Date of Birth/Sex: Apr 04, 1961 (56 y.o. M) Treating RN: Roger Shelter Primary Care Daria Mcmeekin: Roslynn Amble,  SHANNON Other Clinician: Referring Abrahim Sargent: Soyla Dryer Treating Sharni Negron/Extender: Cathie Olden in Treatment: 0 Wound Status Wound Number: 1 Primary Diabetic Wound/Ulcer of the Lower Extremity Etiology: Wound Location: Right Toe Great - Plantar Wound Open Wounding Event: Gradually Appeared Status: Date Acquired: 12/24/2017 Comorbid Cataracts, Hypertension, Type II Diabetes, Weeks Of Treatment: 0 History: Rheumatoid Arthritis, Neuropathy Clustered Wound: No Photos Photo Uploaded By: Roger Shelter on 01/24/2018 15:45:02 Wound Measurements Length: (cm) 1 Width: (cm) 0.7 Depth: (cm) 0.6 Area: (cm) 0.55 Volume: (cm) 0.33 % Reduction in Area: % Reduction in Volume: Epithelialization: None Tunneling: No Undermining: Yes Starting Position (o'clock): 6 Ending Position (o'clock): 12 Maximum Distance: (cm) 0.6 Wound Description Classification: Grade 2 Wound Margin: Distinct, outline attached Exudate Amount: Large Exudate Type: Serosanguineous Exudate Color: red, [place] Foul Odor After Cleansing: No Slough/Fibrino Yes Wound Bed Granulation Amount: Small (1-33%) Exposed Structure Granulation Quality: Red, Pink Fascia Exposed: No Necrotic Amount: Large (67-100%) Fat Layer (Subcutaneous Tissue) Exposed: Yes Necrotic Quality: Eschar Tendon Exposed: No Muscle Exposed: No Beougher, [place] ([id number]) Joint Exposed: No Bone Exposed: No Periwound Skin Texture Texture Color No  Abnormalities Noted: No No Abnormalities Noted: No Callus: Yes Atrophie Blanche: No Crepitus: No Cyanosis: No Excoriation: No Ecchymosis: No Induration: No Erythema: No Rash: No Hemosiderin Staining: No Scarring: No Mottled: No Pallor: No Moisture Rubor: No No Abnormalities Noted: No Dry / Scaly: No Temperature / Pain Maceration: No Temperature: No Abnormality Wound Preparation Ulcer Cleansing: Rinsed/Irrigated with Saline Topical Anesthetic Applied: Other: lidocaine 4%, Electronic Signature(s) Signed: 01/24/2018 3:48:39 PM By: Roger Shelter Entered By: Roger Shelter on 01/24/2018 13:05:31 [place], [place] ([id number]) -------------------------------------------------------------------------------- Vitals Details Patient Name: [name] [name] Date of Service: 01/24/2018 12:30 PM Medical Record Number: [id number] Patient Account Number: [id number] Date of Birth/Sex: 07/05/1960 (57 y.o. M) Treating RN: Roger Shelter Primary Care Matthias Bogus: Soyla Dryer Other Clinician: Referring Ruweyda Macknight: Soyla Dryer Treating Demetrios Byron/Extender: Cathie Olden in Treatment: 0 Vital Signs Time Taken: 12:47 Temperature (F): 98.3 Height (in): 70 Pulse (bpm): 81 Source: Stated Respiratory Rate (breaths/min): 18 Weight (lbs): 139 Blood Pressure (mmHg): 133/80 Source: Measured Reference Range: 80 - 120 mg / dl Body Mass Index (BMI): 19.9 Electronic Signature(s) Signed: 01/24/2018 3:48:39 PM By: Roger Shelter Entered By: Roger Shelter on 01/24/2018 12:47:23

## 2018-02-09 NOTE — Progress Notes (Signed)
JAVANTE, NILSSON ([id number]) Visit Report for 01/31/2018 Arrival Information Details Patient Name: [name] [name], [name] [name] Date of Service: 01/31/2018 1:00 PM Medical Record Number: [id number] Patient Account Number: [id number] Date of Birth/Sex: [birth year] (58 y.o. M) Treating RN: [name] [name] Primary Care [name] [name]: [name] [name] Other Clinician: Referring Harlem Thresher: [name] [name] Treating [name] [name]/Extender: [name] [name] in Treatment: 1 Visit Information History Since Last Visit Added or deleted any medications: No Patient Arrived: Ambulatory Any new allergies or adverse reactions: No Arrival Time: 12:51 Had a fall or experienced change in No Accompanied By: self activities of daily living that may affect Transfer Assistance: None risk of falls: Patient Identification Verified: Yes Signs or symptoms of abuse/neglect since last visito No Secondary Verification Process Completed: Yes Hospitalized since last visit: No Implantable device outside of the clinic excluding No cellular tissue based products placed in the center since last visit: Has Dressing in Place as Prescribed: Yes Pain Present Now: No Electronic Signature(s) Signed: 01/31/2018 3:41:35 PM By: [name] [name] Entered By: [name] [name] on 01/31/2018 12:56:40 Stoneking, Nnamdi ([id number]) -------------------------------------------------------------------------------- Lower Extremity Assessment Details Patient Name: [name] [name] Date of Service: 01/31/2018 1:00 PM Medical Record Number: [id number] Patient Account Number: [id number] Date of Birth/Sex: 25-May-1961 (56 y.o. M) Treating RN: [name] [name] Primary Care Joetta Delprado: [name] [name] Other Clinician: Referring Stone Spirito: [name] [name] Treating Latina Frank/Extender: [name] [name] in Treatment: 1 Edema Assessment Assessed: [Left: No] [Right: No] Edema: [Left: N] [Right: o] Calf Left: Right: Point of Measurement: 36 cm From Medial Instep cm 30 cm Ankle Left:  Right: Point of Measurement: 14 cm From Medial Instep cm 19 cm Vascular Assessment Claudication: Claudication Assessment [Right:None] Pulses: Dorsalis Pedis Palpable: [Right:Yes] Posterior Tibial Extremity colors, hair growth, and conditions: Extremity Color: [Right:Normal] Hair Growth on Extremity: [Right:No] Temperature of Extremity: [Right:Warm] Capillary Refill: [Right:< 3 seconds] Toe Nail Assessment Left: Right: Thick: No Discolored: No Deformed: No Improper Length and Hygiene: No Electronic Signature(s) Signed: 01/31/2018 3:41:35 PM By: [name] [name] Entered By: [name] [name] on 01/31/2018 13:04:39 Sherrin, Logen ([id number]) -------------------------------------------------------------------------------- Multi Wound Chart Details Patient Name: [name] [name] Date of Service: 01/31/2018 1:00 PM Medical Record Number: [id number] Patient Account Number: [id number] Date of Birth/Sex: 01-Aug-1960 (56 y.o. M) Treating RN: [name] [name] Primary Care Corinthian Kemler: [name] [name] Other Clinician: Referring Jessey Huyett: [name] [name] Treating Robyne Matar/Extender: [name] [name] in Treatment: 1 Vital Signs Height(in): 70 Pulse(bpm): 87 Weight(lbs): 139 Blood Pressure(mmHg): 128/79 Body Mass Index(BMI): 20 Temperature(F): 98.5 Respiratory Rate 18 (breaths/min): Photos: [N/A:N/A] Wound Location: Right Toe Great - Plantar N/A N/A Wounding Event: Gradually Appeared N/A N/A Primary Etiology: Diabetic Wound/Ulcer of the N/A N/A Lower Extremity Comorbid History: Cataracts, Hypertension, Type N/A N/A II Diabetes, Rheumatoid Arthritis, Neuropathy Date Acquired: 12/24/2017 N/A N/A Weeks of Treatment: 1 N/A N/A Wound Status: Open N/A N/A Measurements L x W x D 0.7x0.6x0.6 N/A N/A (cm) Area (cm) : 0.33 N/A N/A Volume (cm) : 0.198 N/A N/A % Reduction in Area: 40.00% N/A N/A % Reduction in Volume: 40.00% N/A N/A Classification: Grade 2 N/A N/A Exudate Amount: Medium N/A  N/A Exudate Type: Serosanguineous N/A N/A Exudate Color: red, [name] [name] N/A N/A Wound Margin: Distinct, outline attached N/A N/A Granulation Amount: Small (1-33%) N/A N/A Granulation Quality: Red, Pink N/A N/A Necrotic Amount: Large (67-100%) N/A N/A Necrotic Tissue: Eschar N/A N/A Exposed Structures: Fat Layer (Subcutaneous N/A N/A Tissue) Exposed: Yes Fascia: No Tendon: No Krantz, [name] [name] ([id number]) Muscle: No Joint: No Bone: No Epithelialization: None N/A N/A Debridement: Debridement - Excisional N/A N/A Pre-procedure 13:17 N/A  N/A Verification/Time Out Taken: Pain Control: Lidocaine 4% Topical Solution N/A N/A Tissue Debrided: Callus, Subcutaneous, Slough N/A N/A Level: Skin/Subcutaneous Tissue N/A N/A Debridement Area (sq cm): 0.42 N/A N/A Instrument: Blade, Forceps N/A N/A Bleeding: Moderate N/A N/A Hemostasis Achieved: Pressure N/A N/A Procedural Pain: 0 N/A N/A Post Procedural Pain: 0 N/A N/A Debridement Treatment Procedure was tolerated well N/A N/A Response: Post Debridement 1.2x1.1x1.3 N/A N/A Measurements L x W x D (cm) Post Debridement Volume: 1.348 N/A N/A (cm) Periwound Skin Texture: Callus: Yes N/A N/A Excoriation: No Induration: No Crepitus: No Rash: No Scarring: No Periwound Skin Moisture: Maceration: No N/A N/A Dry/Scaly: No Periwound Skin Color: Atrophie Blanche: No N/A N/A Cyanosis: No Ecchymosis: No Erythema: No Hemosiderin Staining: No Mottled: No Pallor: No Rubor: No Temperature: No Abnormality N/A N/A Tenderness on Palpation: No N/A N/A Wound Preparation: Ulcer Cleansing: N/A N/A Rinsed/Irrigated with Saline Topical Anesthetic Applied: Other: lidocaine 4% Procedures Performed: Debridement N/A N/A Treatment Notes Electronic Signature(s) Signed: 01/31/2018 5:35:06 PM By: [name] Ham MD Entered By: [name] [name] on 01/31/2018 13:27:11 Dickens, Thermal  ([id number]) -------------------------------------------------------------------------------- Multi-Disciplinary Care Plan Details Patient Name: [name] [name] Date of Service: 01/31/2018 1:00 PM Medical Record Number: [id number] Patient Account Number: [id number] Date of Birth/Sex: 11/13/1960 (56 y.o. M) Treating RN: [name] [name] Primary Care Juelle Dickmann: [name] [name] Other Clinician: Referring Alyanah Elliott: [name] [name] Treating Ceniyah Thorp/Extender: [name] [name] in Treatment: 1 Active Inactive ` Necrotic Tissue Nursing Diagnoses: Knowledge deficit related to management of necrotic/devitalized tissue Goals: Necrotic/devitalized tissue will be minimized in the wound bed Date Initiated: 01/24/2018 Target Resolution Date: 02/09/2018 Goal Status: Active Interventions: Assess patient pain level pre-, during and post procedure and prior to discharge Treatment Activities: Excisional debridement : 01/24/2018 Notes: ` Orientation to the Wound Care Program Nursing Diagnoses: Knowledge deficit related to the wound healing center program Goals: Patient/caregiver will verbalize understanding of the [place] Date Initiated: 01/24/2018 Target Resolution Date: 02/09/2018 Goal Status: Active Interventions: Provide education on orientation to the wound center Notes: ` Pressure Nursing Diagnoses: Knowledge deficit related to management of pressures ulcers Goals: Patient will remain free from development of additional pressure ulcers Date Initiated: 01/24/2018 Target Resolution Date: 02/09/2018 JAESON, MOLSTAD ([id number]) Goal Status: Active Interventions: Assess potential for pressure ulcer upon admission and as needed Notes: ` Wound/Skin Impairment Nursing Diagnoses: Knowledge deficit related to ulceration/compromised skin integrity Goals: Ulcer/skin breakdown will have a volume reduction of 30% by week 4 Date Initiated: 01/24/2018 Target Resolution Date:  02/24/2018 Goal Status: Active Interventions: Assess ulceration(s) every visit Treatment Activities: Topical wound management initiated : 01/24/2018 Notes: Electronic Signature(s) Signed: 01/31/2018 5:18:29 PM By: [name] [name] Entered By: [name] [name] on 01/31/2018 13:15:26 Iafrate, Yoseph ([id number]) -------------------------------------------------------------------------------- Pain Assessment Details Patient Name: [name] [name] Date of Service: 01/31/2018 1:00 PM Medical Record Number: [id number] Patient Account Number: [id number] Date of Birth/Sex: [birth year] (57 y.o. M) Treating RN: [name] [name] Primary Care Cristhian Vanhook: [name] [name] Other Clinician: Referring Del Overfelt: [name] [name] Treating Osher Oettinger/Extender: [name] [name] in Treatment: 1 Active Problems Location of Pain Severity and Description of Pain Patient Has Paino No Site Locations Pain Management and Medication Current Pain Management: Goals for Pain Management pt denies any pain at this time. Electronic Signature(s) Signed: 01/31/2018 3:41:35 PM By: [name] [name] Entered By: [name] [name] on 01/31/2018 12:57:50 Hodgman, Micaiah ([id number]) -------------------------------------------------------------------------------- Wound Assessment Details Patient Name: [name] [name] Date of Service: 01/31/2018 1:00 PM Medical Record Number: [id number] Patient Account Number: [id number] Date of Birth/Sex: [birth year] (56 y.o. M) Treating RN:  [name] [name] Primary Care Skarlet Lyons: [name] [name] Other Clinician: Referring Antara Brecheisen: [name] [name] Treating Amadu Schlageter/Extender: [name] [name] in Treatment: 1 Wound Status Wound Number: 1 Primary Diabetic Wound/Ulcer of the Lower Extremity Etiology: Wound Location: Right Toe Great - Plantar Wound Open Wounding Event: Gradually Appeared Status: Date Acquired: 12/24/2017 Comorbid Cataracts, Hypertension, Type II Diabetes, Weeks Of Treatment: 1 History: Rheumatoid  Arthritis, Neuropathy Clustered Wound: No Photos Photo Uploaded By: [name] [name] on 01/31/2018 13:07:41 Wound Measurements Length: (cm) 0.7 % Reductio Width: (cm) 0.6 % Reductio Depth: (cm) 0.6 Epithelial Area: (cm) 0.33 Tunneling Volume: (cm) 0.198 Undermini n in Area: 40% n in Volume: 40% ization: None : No ng: No Wound Description Classification: Grade 2 Foul Odor Wound Margin: Distinct, outline attached Slough/Fi Exudate Amount: Medium Exudate Type: Serosanguineous Exudate Color: red, [place] After Cleansing: No brino Yes Wound Bed Granulation Amount: Small (1-33%) Exposed Structure Granulation Quality: Red, Pink Fascia Exposed: No Necrotic Amount: Large (67-100%) Fat Layer (Subcutaneous Tissue) Exposed: Yes Necrotic Quality: Eschar Tendon Exposed: No Muscle Exposed: No Joint Exposed: No Bone Exposed: No Periwound Skin Texture Matsushita, Isack ([id number]) Texture Color No Abnormalities Noted: No No Abnormalities Noted: No Callus: Yes Atrophie Blanche: No Crepitus: No Cyanosis: No Excoriation: No Ecchymosis: No Induration: No Erythema: No Rash: No Hemosiderin Staining: No Scarring: No Mottled: No Pallor: No Moisture Rubor: No No Abnormalities Noted: No Dry / Scaly: No Temperature / Pain Maceration: No Temperature: No Abnormality Wound Preparation Ulcer Cleansing: Rinsed/Irrigated with Saline Topical Anesthetic Applied: Other: lidocaine 4%, Electronic Signature(s) Signed: 01/31/2018 3:41:35 PM By: [name] [name] Entered By: [name] [name] on 01/31/2018 13:02:51 Tribbett, Waseem ([id number]) -------------------------------------------------------------------------------- Vitals Details Patient Name: [name] [name] Date of Service: 01/31/2018 1:00 PM Medical Record Number: [id number] Patient Account Number: [id number] Date of Birth/Sex: July 13, 1960 (56 y.o. M) Treating RN: [name] [name] Primary Care Josejulian Tarango: [name] [name] Other Clinician: Referring Saranya Harlin:  [name] [name] Treating Brice Kossman/Extender: [name] [name] in Treatment: 1 Vital Signs Time Taken: 12:50 Temperature (F): 98.5 Height (in): 70 Pulse (bpm): 87 Weight (lbs): 139 Respiratory Rate (breaths/min): 18 Body Mass Index (BMI): 19.9 Blood Pressure (mmHg): 128/79 Reference Range: 80 - 120 mg / dl Electronic Signature(s) Signed: 01/31/2018 3:41:35 PM By: [name] [name] Entered BySecundino [name] on 01/31/2018 12:58:16

## 2018-02-10 NOTE — Progress Notes (Signed)
[name] [name] ([id number]) Visit Report for 01/31/2018 Debridement Details Patient Name: [name] [name] Date of Service: 01/31/2018 1:00 PM Medical Record Number: [id number] Patient Account Number: [id number] Date of Birth/Sex: [birth year] (56 y.o. M) Treating RN: Cornell Barman Primary Care Provider: Soyla Dryer Other Clinician: Referring Provider: Soyla Dryer Treating Provider/Extender: Tito Dine in Treatment: 1 Debridement Performed for Wound #1 Right,Plantar Toe Great Assessment: Performed By: Physician Ricard Dillon, MD Debridement Type: Debridement Severity of Tissue Pre Fat layer exposed Debridement: Pre-procedure Verification/Time Yes - 13:17 Out Taken: Start Time: 13:17 Pain Control: Lidocaine 4% Topical Solution Total Area Debrided (L x W): 0.7 (cm) x 0.6 (cm) = 0.42 (cm) Tissue and other material Viable, Non-Viable, Callus, Slough, Subcutaneous, Slough debrided: Level: Skin/Subcutaneous Tissue Debridement Description: Excisional Instrument: Blade, Forceps Bleeding: Moderate Hemostasis Achieved: Pressure End Time: 13:21 Procedural Pain: 0 Post Procedural Pain: 0 Response to Treatment: Procedure was tolerated well Level of Consciousness: Awake and Alert Post Debridement Measurements of Total Wound Length: (cm) 1.2 Width: (cm) 1.1 Depth: (cm) 1.3 Volume: (cm) 1.348 Character of Wound/Ulcer Post Debridement: Improved Severity of Tissue Post Debridement: Fat layer exposed Post Procedure Diagnosis Same as Pre-procedure Electronic Signature(s) Signed: 01/31/2018 5:35:06 PM By: Linton Ham MD Signed: 02/01/2018 4:35:08 PM By: Gretta Cool, BSN, RN, CWS, Kim RN, BSN Entered By: Linton Ham on 01/31/2018 13:27:20 [name] [name] ([id number]) -------------------------------------------------------------------------------- HPI Details Patient Name: [name] [name] Date of Service: 01/31/2018 1:00 PM Medical Record Number: [id number] Patient  Account Number: [id number] Date of Birth/Sex: 1960-10-28 (57 y.o. M) Treating RN: Cornell Barman Primary Care Provider: Soyla Dryer Other Clinician: Referring Provider: Soyla Dryer Treating Provider/Extender: Tito Dine in Treatment: 1 History of Present Illness HPI Description: 01/24/18-He is seen in initial evaluation for right great toe ulcer. He was sent to the emergency room on 718 from the nurse clinic office for new-onset diabetes with an A1c of 12.9. He was discharged from the emergency room on metformin, had a right foot x-ray that was negative for osteomyelitis and had a follow-up with his primary care at the free clinic on 7/22. He has been treated twice with antibiotic therapy; bactrim (7/8) and doxycycline (7/18). He is neuropathic. He tolerated debridement; bone is palpable through a thin layer of tissue; there is, what appears to be, resolving erythema and edema; a culture was taken, antibiotics will be prescribed as needed based on sensitivities. 01/31/18; this is a patient with new onset type 2 diabetes who is on oral agents. Culture from last time showed a few Klebsiella and a few enterococcus is being appropriately treated with a combination of Augmentin and ciprofloxacin for 10 days which I think he just started yesterday. He has to get his medications through the free clinic in [place]. He has no insurance. Been using Prisma to the wound. Previous x-ray last month was negative for osteomyelitis.his ABIs look reasonable. He has an Astronomer) Signed: 01/31/2018 5:35:06 PM By: Linton Ham MD Entered By: Linton Ham on 01/31/2018 13:42:01 [name] [name] ([id number]) -------------------------------------------------------------------------------- Physical Exam Details Patient Name: [name] [name] Date of Service: 01/31/2018 1:00 PM Medical Record Number: [id number] Patient Account Number: [id number] Date of Birth/Sex:  [birth year] (56 y.o. M) Treating RN: Cornell Barman Primary Care Provider: Soyla Dryer Other Clinician: Referring Provider: Soyla Dryer Treating Provider/Extender: Tito Dine in Treatment: 1 Constitutional Sitting or standing Blood Pressure is within target range for patient.. Pulse regular and within target range for patient.Marland Kitchen Respirations regular, non-labored and within target range.. Temperature is normal  and within the target range for the patient.Marland Kitchen appears in no distress. Cardiovascular herbal pedal pulses on the right. Notes wound exam; the areas on the plantar right great toe. He had thick callus and subcutaneous tissue around the wound. I remove this with a #15 scalpel and pickups. In the middle of this wound there is a deep probing area that goes precariously close to bone. There is no surrounding erythema and no purulence was seen. Electronic Signature(s) Signed: 01/31/2018 5:35:06 PM By: Linton Ham MD Entered By: Linton Ham on 01/31/2018 13:43:20 [name] [name] ([id number]) -------------------------------------------------------------------------------- Physician Orders Details Patient Name: [name] [name] Date of Service: 01/31/2018 1:00 PM Medical Record Number: [id number] Patient Account Number: [id number] Date of Birth/Sex: [birth year] (57 y.o. M) Treating RN: Montey Hora Primary Care Provider: Soyla Dryer Other Clinician: Referring Provider: Soyla Dryer Treating Provider/Extender: Tito Dine in Treatment: 1 Verbal / Phone Orders: No Diagnosis Coding Wound Cleansing Wound #1 Right,Plantar Toe Great o Clean wound with Normal Saline. Anesthetic (add to Medication List) Wound #1 Right,Plantar Toe Great o Topical Lidocaine 4% cream applied to wound bed prior to debridement (In Clinic Only). Primary Wound Dressing Wound #1 Right,Plantar Toe Great o Silver Collagen Secondary Dressing o Gauze and  Kerlix/Conform Dressing Change Frequency Wound #1 Right,Plantar Toe Great o Change Dressing Monday, Wednesday, Friday Follow-up Appointments Wound #1 Right,Plantar Toe Great o Return Appointment in 1 week. Off-Loading Wound #1 Right,Plantar Toe Great o Open toe surgical shoe with peg assist. Additional Orders / Instructions Wound #1 Right,Plantar Toe Great o Increase protein intake. Electronic Signature(s) Signed: 01/31/2018 5:18:29 PM By: Montey Hora Signed: 01/31/2018 5:35:06 PM By: Linton Ham MD Entered By: Montey Hora on 01/31/2018 13:25:20 [place], Alexz[place] ([id number]) -------------------------------------------------------------------------------- Problem List Details Patient Name: [name] [name] Date of Service: 01/31/2018 1:00 PM Medical Record Number: [id number] Patient Account Number: [id number] Date of Birth/Sex: 1960/06/19 (57 y.o. 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 M) Treating RN: Cornell Barman Primary Care Provider: Soyla Dryer Other Clinician: Referring Provider: Soyla Dryer Treating Provider/Extender: Tito Dine in Treatment: 1 Subjective History of Present Illness (HPI) 01/24/18-He is seen in initial evaluation for right great toe ulcer. He was sent to the emergency room on 718 from the nurse clinic office for new-onset diabetes with an A1c of 12.9. He was discharged from the emergency room on metformin, had a right foot x-ray that was negative for osteomyelitis and had a follow-up with his primary care at the free clinic on 7/22. He has been treated twice with antibiotic therapy; bactrim (7/8) and doxycycline (7/18). He is neuropathic. He tolerated debridement; bone is palpable through a thin layer of tissue; there is, what appears to be, resolving erythema and edema; a culture was taken, antibiotics will be prescribed as needed based on sensitivities. 01/31/18;  this is a patient with new onset type 2 diabetes who is on oral agents. Culture from last time showed a few Klebsiella and a few enterococcus is being appropriately treated with a combination of Augmentin and ciprofloxacin for 10 days which I think he just started yesterday. He has to get his medications through the free clinic in [place]. He has no insurance. Been using Prisma to the wound. Previous x-ray last month was negative for osteomyelitis.his ABIs look reasonable. He has an offloading sandal Objective Constitutional Sitting or standing Blood Pressure is within target range for patient.. Pulse regular and within target range for patient.Marland Kitchen Respirations regular, non-labored and within target range.. Temperature is normal and within the target range for the patient.Marland Kitchen appears in no distress. Vitals Time Taken: 12:50 PM, Height: 70 in, Weight: 139 lbs, BMI: 19.9, Temperature: 98.5 F, Pulse: 87 bpm, Respiratory Rate: 18 breaths/min, Blood Pressure: 128/79 mmHg. Cardiovascular herbal  pedal pulses on the right. General Notes: wound exam; the areas on the plantar right great toe. He had thick callus and subcutaneous tissue around the wound. I remove this with a #15 scalpel and pickups. In the middle of this wound there is a deep probing area that goes precariously close to bone. There is no surrounding erythema and no purulence was seen. Integumentary (Hair, Skin) Wound #1 status is Open. Original cause of wound was Gradually Appeared. The wound is located on the [employer]. The wound measures 0.7cm length x 0.6cm width x 0.6cm depth; 0.33cm^2 area and 0.198cm^3 volume. There is Fat Layer (Subcutaneous Tissue) Exposed exposed. There is no tunneling or undermining noted. There is a medium amount of serosanguineous drainage noted. The wound margin is distinct with the outline attached to the wound base. There is small (1-33%) red, pink granulation within the wound bed. There is a large (67-100%) amount of necrotic tissue within the wound Mee, Handy ([id number]) bed including Eschar. The periwound skin appearance exhibited: Callus. The periwound skin appearance did not exhibit: Crepitus, Excoriation, Induration, Rash, Scarring, Dry/Scaly, Maceration, Atrophie Blanche, Cyanosis, Ecchymosis, Hemosiderin Staining, Mottled, Pallor, Rubor, Erythema. Periwound temperature was noted as No Abnormality. Assessment Active Problems ICD-10 Type 2 diabetes mellitus with foot ulcer Non-pressure chronic ulcer of other part of right foot with necrosis of muscle Type 2 diabetes mellitus with diabetic neuropathy, unspecified Procedures Wound #1 Pre-procedure diagnosis of Wound #1 is a Diabetic Wound/Ulcer of the Lower Extremity located on the Right,Plantar Toe Great .Severity of Tissue Pre Debridement is: Fat layer exposed. There was a Excisional Skin/Subcutaneous Tissue Debridement with a total area of 0.42 sq cm performed by Ricard Dillon, MD. With the following  instrument(s): Blade, and Forceps to remove Viable and Non-Viable tissue/material. Material removed includes Callus, Subcutaneous Tissue, and Slough after achieving pain control using Lidocaine 4% Topical Solution. No specimens were taken. A time out was conducted at 13:17, prior to the start of the procedure. A Moderate amount of bleeding was controlled with Pressure. The procedure was tolerated well with a pain level of 0 throughout and a pain level of 0 following the procedure. Patient s Level of Consciousness post procedure was recorded as Awake and Alert. Post Debridement Measurements: 1.2cm length x 1.1cm width x 1.3cm depth; 1.348cm^3 volume. Character of Wound/Ulcer Post Debridement is improved. Severity of Tissue Post Debridement is: Fat layer exposed. Post procedure Diagnosis Wound #1: Same as Pre-Procedure Plan Wound Cleansing: Wound #1 Right,Plantar Toe Great: Clean wound with Normal Saline. Anesthetic (add to Medication List): Wound #1 Right,Plantar  Toe Great: Topical Lidocaine 4% cream applied to wound bed prior to debridement (In Clinic Only). Primary Wound Dressing: Wound #1 Right,Plantar Toe Great: Silver Collagen Secondary Dressing: Gauze and Kerlix/Conform Dressing Change Frequency: Wound #1 Right,Plantar Toe Great: Change Dressing Monday, Wednesday, Friday Follow-up Appointments: KEYLEN, ECKENRODE ([id number]) Wound #1 Right,Plantar Toe Great: Return Appointment in 1 week. Off-Loading: Wound #1 Right,Plantar Toe Great: Open toe surgical shoe with peg assist. Additional Orders / Instructions: Wound #1 Right,Plantar Toe Great: Increase protein intake. #1 continue antibiotics which were just started yesterday Augmentin and ciprofloxacin #2 we'll see how he does on antibiotic therapy. He says there is drainage I didn't really see any. We'll have to consider an MRI #3 continue Prisma with kerlix offloading. He says he is changing this sometimes twice a day because of  drainage I didn't really see a lot of drainage even with a fairly extensive debridement Electronic Signature(s) Signed: 01/31/2018 5:35:06 PM By: Linton Ham MD Entered By: Linton Ham on 01/31/2018 13:44:57 Marcy, Nuno ([id number]) -------------------------------------------------------------------------------- [place] Details Patient Name: [name] [name] Date of Service: 01/31/2018 Medical Record Number: [id number] Patient Account Number: [id number] Date of Birth/Sex: 01/12/1961 (57 y.o. M) Treating RN: Cornell Barman Primary Care Provider: Soyla Dryer Other Clinician: Referring Provider: Soyla Dryer Treating Provider/Extender: Tito Dine in Treatment: 1 Diagnosis Coding ICD-10 Codes Code Description E11.621 Type 2 diabetes mellitus with foot ulcer L97.513 Non-pressure chronic ulcer of other part of right foot with necrosis of muscle E11.40 Type 2 diabetes mellitus with diabetic neuropathy, unspecified Facility Procedures CPT4 Code Description: [id number] 11042 - DEB SUBQ TISSUE 20 SQ CM/< ICD-10 Diagnosis Description L97.513 Non-pressure chronic ulcer of other part of right foot with necr Modifier: osis of muscle Quantity: 1 Physician Procedures CPT4 Code Description: [id number] 53202 - WC PHYS SUBQ TISS 20 SQ CM ICD-10 Diagnosis Description L97.513 Non-pressure chronic ulcer of other part of right foot with necr Modifier: osis of muscle Quantity: 1 Electronic Signature(s) Signed: 01/31/2018 5:35:06 PM By: Linton Ham MD Entered By: Linton Ham on 01/31/2018 13:45:18

## 2018-02-14 ENCOUNTER — Encounter: Payer: Self-pay | Attending: Internal Medicine | Admitting: Internal Medicine

## 2018-02-14 ENCOUNTER — Encounter: Payer: Self-pay | Admitting: Physician Assistant

## 2018-02-14 ENCOUNTER — Ambulatory Visit: Payer: Self-pay | Admitting: Physician Assistant

## 2018-02-14 VITALS — BP 135/80 | HR 84 | Temp 97.9°F | Ht 67.5 in | Wt 134.5 lb

## 2018-02-14 DIAGNOSIS — I10 Essential (primary) hypertension: Secondary | ICD-10-CM | POA: Insufficient documentation

## 2018-02-14 DIAGNOSIS — K219 Gastro-esophageal reflux disease without esophagitis: Secondary | ICD-10-CM

## 2018-02-14 DIAGNOSIS — E785 Hyperlipidemia, unspecified: Secondary | ICD-10-CM

## 2018-02-14 DIAGNOSIS — L97513 Non-pressure chronic ulcer of other part of right foot with necrosis of muscle: Secondary | ICD-10-CM | POA: Insufficient documentation

## 2018-02-14 DIAGNOSIS — E1165 Type 2 diabetes mellitus with hyperglycemia: Secondary | ICD-10-CM

## 2018-02-14 DIAGNOSIS — E11621 Type 2 diabetes mellitus with foot ulcer: Secondary | ICD-10-CM | POA: Insufficient documentation

## 2018-02-14 DIAGNOSIS — S91109S Unspecified open wound of unspecified toe(s) without damage to nail, sequela: Secondary | ICD-10-CM

## 2018-02-14 NOTE — Progress Notes (Signed)
BP 135/80 (BP Location: Right Arm, Patient Position: Sitting, Cuff Size: Normal)   Pulse 84   Temp 97.9 F (36.6 C) (Other (Comment))   Ht 5' 7.5" (1.715 m)   Wt 134 lb 8 oz (61 kg)   SpO2 99%   BMI 20.75 kg/m    Subjective:    Patient ID: [name] [name], male    DOB: Dec 08, 1960, 57 y.o.   MRN: [id number]  HPI: [name] [name] is a 57 y.o. male presenting on 02/14/2018 for Diabetes   HPI   Pt is going to the Wound clinic for care of his toe- he has appointment there later today- he is on 2 meds from there but he has no idea what they are and he did not bring any of his meds with him to his appointment today.   Pt is monitoring his bs and says it is running [phone].  He went to DM education class and says he learned a lot  Pt's bp is up some today- it was 100/60 at last OV.  He says he feels well.   Relevant past medical, surgical, family and social history reviewed and updated as indicated. Interim medical history since our last visit reviewed. Allergies and medications reviewed and updated.   Current Outpatient Medications:  .  acetaminophen (TYLENOL) 500 MG tablet, Take 500 mg by mouth every 6 (six) hours as needed (arthritis). , Disp: , Rfl:  .  atorvastatin (LIPITOR) 20 MG tablet, Take 1 tablet (20 mg total) by mouth daily., Disp: 90 tablet, Rfl: 1 .  Calcium Carbonate-Simethicone (ALKA-SELTZER HEARTBURN + GAS) 750-80 MG CHEW, Chew 1 tablet by mouth daily as needed., Disp: , Rfl:  .  famotidine (PEPCID) 20 MG tablet, Take 1 tablet (20 mg total) by mouth 2 (two) times daily as needed for heartburn or indigestion., Disp: 60 tablet, Rfl: 3 .  lisinopril (PRINIVIL,ZESTRIL) 10 MG tablet, Take 1 tablet (10 mg total) by mouth daily., Disp: 90 tablet, Rfl: 0 .  metFORMIN (GLUCOPHAGE) 1000 MG tablet, Take 1 tablet (1,000 mg total) by mouth 2 (two) times daily with a meal., Disp: 180 tablet, Rfl: 1 .  Omega-3 Fatty Acids (FISH OIL) 1200 MG CAPS, Take 1 capsule (1,200 mg total) by  mouth 2 (two) times daily., Disp: , Rfl:  .  sitaGLIPtin (JANUVIA) 100 MG tablet, Take 1 tablet (100 mg total) by mouth daily., Disp: 90 tablet, Rfl: 0   Review of Systems  Constitutional: Positive for appetite change and unexpected weight change. Negative for chills, diaphoresis, fatigue and fever.  HENT: Negative for congestion, dental problem, drooling, ear pain, facial swelling, hearing loss, mouth sores, sneezing, sore throat, trouble swallowing and voice change.   Eyes: Positive for visual disturbance. Negative for pain, discharge, redness and itching.  Respiratory: Negative for cough, choking, shortness of breath and wheezing.   Cardiovascular: Negative for chest pain, palpitations and leg swelling.  Gastrointestinal: Negative for abdominal pain, blood in stool, constipation, diarrhea and vomiting.  Endocrine: Negative for cold intolerance, heat intolerance and polydipsia.  Genitourinary: Negative for decreased urine volume, dysuria and hematuria.  Musculoskeletal: Positive for arthralgias and gait problem. Negative for back pain.  Skin: Negative for rash.  Allergic/Immunologic: Positive for environmental allergies.  Neurological: Negative for seizures, syncope, light-headedness and headaches.  Hematological: Negative for adenopathy.  Psychiatric/Behavioral: Negative for agitation, dysphoric mood and suicidal ideas. The patient is not nervous/anxious.     Per HPI unless specifically indicated above     Objective:  BP 135/80 (BP Location: Right Arm, Patient Position: Sitting, Cuff Size: Normal)   Pulse 84   Temp 97.9 F (36.6 C) (Other (Comment))   Ht 5' 7.5" (1.715 m)   Wt 134 lb 8 oz (61 kg)   SpO2 99%   BMI 20.75 kg/m   Wt Readings from Last 3 Encounters:  02/14/18 134 lb 8 oz (61 kg)  01/15/18 139 lb (63 kg)  01/08/18 141 lb 4 oz (64.1 kg)    Physical Exam  Constitutional: He is oriented to person, place, and time. He appears well-developed and well-nourished.   HENT:  Head: Normocephalic and atraumatic.  Neck: Neck supple.  Cardiovascular: Normal rate and regular rhythm.  Pulmonary/Chest: Effort normal and breath sounds normal. He has no wheezes.  Abdominal: Soft. Bowel sounds are normal. There is no hepatosplenomegaly. There is no tenderness.  Musculoskeletal: He exhibits no edema.  Lymphadenopathy:    He has no cervical adenopathy.  Neurological: He is alert and oriented to person, place, and time.  Skin: Skin is warm and dry.  Psychiatric: He has a normal mood and affect. His behavior is normal.  Vitals reviewed.       Assessment & Plan:   Encounter Diagnoses  Name Primary?  Marland Kitchen Uncontrolled type 2 diabetes mellitus with hyperglycemia ([place]) Yes  . Essential hypertension   . Gastroesophageal reflux disease, esophagitis presence not specified   . Hyperlipidemia, unspecified hyperlipidemia type   . Open toe wound, sequela     -pt to continue with the wound clinic per their recommendations to care for his toe wound -pt to continue to monitor blood sugars -no changes to medications today.  Will monitor the blood pressure -pt is given appointment information for diabetic eye exam in November -pt to follow up in 6 weeks with check labs before appointment.   He is to RTO sooner prn

## 2018-02-14 NOTE — Patient Instructions (Addendum)
[facility], [place] [address] [phone] April 13, 2018 (Friday) 1 pm

## 2018-02-15 NOTE — Progress Notes (Signed)
ARISTON, GRANDISON ([id number]) Visit Report for 02/14/2018 Arrival Information Details Patient Name: [name] [name], [name] [name] Date of Service: 02/14/2018 2:30 PM Medical Record Number: [id number] Patient Account Number: [id number] Date of Birth/Sex: 12-10-1960 (57 y.o. M) Treating RN: Secundino Ginger Primary Care Jabar Krysiak: Soyla Dryer Other Clinician: Referring Jereline Ticer: Soyla Dryer Treating Amena Dockham/Extender: Tito Dine in Treatment: 3 Visit Information History Since Last Visit Added or deleted any medications: No Patient Arrived: Ambulatory Any new allergies or adverse reactions: No Arrival Time: 13:56 Had a fall or experienced change in No Accompanied By: self activities of daily living that may affect Transfer Assistance: None risk of falls: Patient Identification Verified: Yes Signs or symptoms of abuse/neglect since last visito No Secondary Verification Process Completed: Yes Hospitalized since last visit: No Implantable device outside of the clinic excluding No cellular tissue based products placed in the center since last visit: Has Dressing in Place as Prescribed: Yes Pain Present Now: No Electronic Signature(s) Signed: 02/14/2018 2:39:50 PM By: Secundino Ginger Entered By: Secundino Ginger on 02/14/2018 [place], [place] ([id number]) -------------------------------------------------------------------------------- Encounter Discharge Information Details Patient Name: [name] [name] Date of Service: 02/14/2018 2:30 PM Medical Record Number: [id number] Patient Account Number: [id number] Date of Birth/Sex: [birth year] (56 y.o. M) Treating RN: Secundino Ginger Primary Care Seger Jani: Soyla Dryer Other Clinician: Referring Elany Felix: Soyla Dryer Treating Lynnleigh Soden/Extender: Tito Dine in Treatment: 3 Encounter Discharge Information Items Discharge Condition: Stable Ambulatory Status: Ambulatory Discharge Destination: Home Transportation: Private Auto Schedule  Follow-up Appointment: Yes Clinical Summary of Care: Electronic Signature(s) Signed: 02/14/2018 3:16:56 PM By: Secundino Ginger Entered By: Secundino Ginger on 02/14/2018 14:48:05 [place], Albie ([id number]) -------------------------------------------------------------------------------- Lower Extremity Assessment Details Patient Name: [name] [name] Date of Service: 02/14/2018 2:30 PM Medical Record Number: [id number] Patient Account Number: [id number] Date of Birth/Sex: [birth year] (56 y.o. M) Treating RN: Secundino Ginger Primary Care Emry Tobin: Soyla Dryer Other Clinician: Referring Amadeus Oyama: Soyla Dryer Treating Toriana Sponsel/Extender: Tito Dine in Treatment: 3 Edema Assessment Assessed: [Left: No] [Right: No] Edema: [Left: N] [Right: o] Calf Left: Right: Point of Measurement: 36 cm From Medial Instep cm 31 cm Ankle Left: Right: Point of Measurement: 14 cm From Medial Instep cm 20 cm Vascular Assessment Claudication: Claudication Assessment [Right:None] Pulses: Dorsalis Pedis Palpable: [Right:Yes] Posterior Tibial Extremity colors, hair growth, and conditions: Extremity Color: [Right:Normal] Hair Growth on Extremity: [Right:Yes] Temperature of Extremity: [Right:Warm] Capillary Refill: [Right:< 3 seconds] Toe Nail Assessment Left: Right: Thick: No Discolored: No Deformed: No Improper Length and Hygiene: No Electronic Signature(s) Signed: 02/14/2018 2:39:50 PM By: Secundino Ginger Entered By: Secundino Ginger on 02/14/2018 14:09:53 [place], [place] ([id number]) -------------------------------------------------------------------------------- Multi Wound Chart Details Patient Name: [name] [name] Date of Service: 02/14/2018 2:30 PM Medical Record Number: [id number] Patient Account Number: [id number] Date of Birth/Sex: 1960-09-06 (56 y.o. M) Treating RN: Secundino Ginger Primary Care Brookie Wayment: Soyla Dryer Other Clinician: Referring Lakai Moree: Soyla Dryer Treating Lucill Mauck/Extender: Tito Dine in Treatment: 3 Vital Signs Height(in): 70 Pulse(bpm): 96 Weight(lbs): 139 Blood Pressure(mmHg): 115/70 Body Mass Index(BMI): 20 Temperature(F): 98.2 Respiratory Rate 18 (breaths/min): Photos: [N/A:N/A] Wound Location: Right Toe Great - Plantar N/A N/A Wounding Event: Gradually Appeared N/A N/A Primary Etiology: Diabetic Wound/Ulcer of the N/A N/A Lower Extremity Comorbid History: Cataracts, Hypertension, Type N/A N/A II Diabetes, Rheumatoid Arthritis, Neuropathy Date Acquired: 12/24/2017 N/A N/A Weeks of Treatment: 3 N/A N/A Wound Status: Open N/A N/A Measurements L x W x D 0.9x0.9x0.4 N/A N/A (cm) Area (cm) : 0.636 N/A N/A Volume (cm) : 0.254 N/A N/A % Reduction in Area: -  15.60% N/A N/A % Reduction in Volume: 23.00% N/A N/A Classification: Grade 2 N/A N/A Exudate Amount: Large N/A N/A Exudate Type: Serous N/A N/A Exudate Color: amber N/A N/A Wound Margin: Distinct, outline attached N/A N/A Granulation Amount: Small (1-33%) N/A N/A Granulation Quality: Red, Pink N/A N/A Necrotic Amount: Large (67-100%) N/A N/A Necrotic Tissue: Eschar N/A N/A Exposed Structures: Fat Layer (Subcutaneous N/A N/A Tissue) Exposed: Yes Fascia: No Tendon: No Olivarez, Javyn ([id number]) Muscle: No Joint: No Bone: No Epithelialization: None N/A N/A Debridement: Debridement - Excisional N/A N/A Pre-procedure 14:30 N/A N/A Verification/Time Out Taken: Pain Control: Other N/A N/A Tissue Debrided: Callus, Subcutaneous, Slough N/A N/A Level: Skin/Subcutaneous Tissue N/A N/A Debridement Area (sq cm): 0.81 N/A N/A Instrument: Curette N/A N/A Bleeding: Minimum N/A N/A Hemostasis Achieved: Pressure N/A N/A Debridement Treatment Procedure was tolerated well N/A N/A Response: Post Debridement 0.9x0.9x0.4 N/A N/A Measurements L x W x D (cm) Post Debridement Volume: 0.254 N/A N/A (cm) Periwound Skin Texture: Callus: Yes N/A N/A Excoriation: No Induration:  No Crepitus: No Rash: No Scarring: No Periwound Skin Moisture: Maceration: No N/A N/A Dry/Scaly: No Periwound Skin Color: Atrophie Blanche: No N/A N/A Cyanosis: No Ecchymosis: No Erythema: No Hemosiderin Staining: No Mottled: No Pallor: No Rubor: No Temperature: No Abnormality N/A N/A Tenderness on Palpation: Yes N/A N/A Wound Preparation: Ulcer Cleansing: N/A N/A Rinsed/Irrigated with Saline Topical Anesthetic Applied: Other: lidocaine 4% Procedures Performed: Debridement N/A N/A Treatment Notes Wound #1 (Right, Plantar Toe Great) 1. Cleansed with: Clean wound with Normal Saline 2. Anesthetic Topical Lidocaine 4% cream to wound bed prior to debridement 4. Dressing Applied: Prisma Ag 5. Secondary Dressing Applied Dry Gauze Kerlix/Conform DOLLAR, [place] ([id number]) Notes darco Electronic Signature(s) Signed: 02/14/2018 4:51:46 PM By: Linton Ham MD Previous Signature: 02/14/2018 2:39:50 PM Version By: Secundino Ginger Entered By: Linton Ham on 02/14/2018 14:54:13 [place], [place] ([id number]) -------------------------------------------------------------------------------- Multi-Disciplinary Care Plan Details Patient Name: [name] [name] Date of Service: 02/14/2018 2:30 PM Medical Record Number: [id number] Patient Account Number: [id number] Date of Birth/Sex: 06-01-1961 (57 y.o. M) Treating RN: Secundino Ginger Primary Care Bright Spielmann: Soyla Dryer Other Clinician: Referring Cleve Paolillo: Soyla Dryer Treating Kameah Rawl/Extender: Tito Dine in Treatment: 3 Active Inactive ` Necrotic Tissue Nursing Diagnoses: Knowledge deficit related to management of necrotic/devitalized tissue Goals: Necrotic/devitalized tissue will be minimized in the wound bed Date Initiated: 01/24/2018 Target Resolution Date: 02/09/2018 Goal Status: Active Interventions: Assess patient pain level pre-, during and post procedure and prior to discharge Treatment Activities: Excisional  debridement : 01/24/2018 Notes: ` Orientation to the Wound Care Program Nursing Diagnoses: Knowledge deficit related to the wound healing center program Goals: Patient/caregiver will verbalize understanding of the [facility] Date Initiated: 01/24/2018 Target Resolution Date: 02/09/2018 Goal Status: Active Interventions: Provide education on orientation to the wound center Notes: ` Pressure Nursing Diagnoses: Knowledge deficit related to management of pressures ulcers Goals: Patient will remain free from development of additional pressure ulcers Date Initiated: 01/24/2018 Target Resolution Date: 02/09/2018 RALPHIE, LOVELADY ([id number]) Goal Status: Active Interventions: Assess potential for pressure ulcer upon admission and as needed Notes: ` Wound/Skin Impairment Nursing Diagnoses: Knowledge deficit related to ulceration/compromised skin integrity Goals: Ulcer/skin breakdown will have a volume reduction of 30% by week 4 Date Initiated: 01/24/2018 Target Resolution Date: 02/24/2018 Goal Status: Active Interventions: Assess ulceration(s) every visit Treatment Activities: Topical wound management initiated : 01/24/2018 Notes: Electronic Signature(s) Signed: 02/14/2018 2:39:50 PM By: Secundino Ginger Entered By: Secundino Ginger on 02/14/2018 14:29:08 [place], [place] ([id number]) -------------------------------------------------------------------------------- Pain Assessment Details  Patient Name: KEYANDRE, PILEGGI Date of Service: 02/14/2018 2:30 PM Medical Record Number: [id number] Patient Account Number: [id number] Date of Birth/Sex: April 15, 1961 (57 y.o. M) Treating RN: Secundino Ginger Primary Care Dorien Bessent: Soyla Dryer Other Clinician: Referring Elzora Cullins: Soyla Dryer Treating Selwyn Reason/Extender: Tito Dine in Treatment: 3 Active Problems Location of Pain Severity and Description of Pain Patient Has Paino No Site Locations Pain Management and Medication Current  Pain Management: Electronic Signature(s) Signed: 02/14/2018 2:39:50 PM By: Secundino Ginger Entered By: Secundino Ginger on 02/14/2018 14:02:35 Dantuono, Ryun ([id number]) -------------------------------------------------------------------------------- Patient/Caregiver Education Details Patient Name: [name] [name] Date of Service: 02/14/2018 2:30 PM Medical Record Number: [id number] Patient Account Number: [id number] Date of Birth/Gender: 02-25-1961 (56 y.o. M) Treating RN: Secundino Ginger Primary Care Physician: Soyla Dryer Other Clinician: Referring Physician: Soyla Dryer Treating Physician/Extender: Tito Dine in Treatment: 3 Education Assessment Education Provided To: Patient Education Topics Provided Wound Debridement: Handouts: Wound Debridement Methods: Explain/Verbal Responses: State content correctly Wound/Skin Impairment: Handouts: Caring for Your Ulcer Methods: Explain/Verbal Responses: State content correctly Electronic Signature(s) Signed: 02/14/2018 3:16:56 PM By: Secundino Ginger Entered By: Secundino Ginger on 02/14/2018 14:48:20 [place], Jarrius ([id number]) -------------------------------------------------------------------------------- Wound Assessment Details Patient Name: [name] [name] Date of Service: 02/14/2018 2:30 PM Medical Record Number: [id number] Patient Account Number: [id number] Date of Birth/Sex: 14-Feb-1961 (56 y.o. M) Treating RN: Secundino Ginger Primary Care Keon Benscoter: Soyla Dryer Other Clinician: Referring Gracy Ehly: Soyla Dryer Treating Ronella Plunk/Extender: Tito Dine in Treatment: 3 Wound Status Wound Number: 1 Primary Diabetic Wound/Ulcer of the Lower Extremity Etiology: Wound Location: Right Toe Great - Plantar Wound Open Wounding Event: Gradually Appeared Status: Date Acquired: 12/24/2017 Comorbid Cataracts, Hypertension, Type II Diabetes, Weeks Of Treatment: 3 History: Rheumatoid Arthritis, Neuropathy Clustered Wound:  No Photos Photo Uploaded By: Secundino Ginger on 02/14/2018 14:12:42 Wound Measurements Length: (cm) 0.9 Width: (cm) 0.9 Depth: (cm) 0.4 Area: (cm) 0.636 Volume: (cm) 0.254 % Reduction in Area: -15.6% % Reduction in Volume: 23% Epithelialization: None Tunneling: No Undermining: No Wound Description Classification: Grade 2 Foul Odor Wound Margin: Distinct, outline attached Slough/Fib Exudate Amount: Large Exudate Type: Serous Exudate Color: amber After Cleansing: No rino Yes Wound Bed Granulation Amount: Small (1-33%) Exposed Structure Granulation Quality: Red, Pink Fascia Exposed: No Necrotic Amount: Large (67-100%) Fat Layer (Subcutaneous Tissue) Exposed: Yes Necrotic Quality: Eschar Tendon Exposed: No Muscle Exposed: No Joint Exposed: No Bone Exposed: No Periwound Skin Texture Brilliant, Kamarrion ([id number]) Texture Color No Abnormalities Noted: No No Abnormalities Noted: No Callus: Yes Atrophie Blanche: No Crepitus: No Cyanosis: No Excoriation: No Ecchymosis: No Induration: No Erythema: No Rash: No Hemosiderin Staining: No Scarring: No Mottled: No Pallor: No Moisture Rubor: No No Abnormalities Noted: No Dry / Scaly: No Temperature / Pain Maceration: No Temperature: No Abnormality Tenderness on Palpation: Yes Wound Preparation Ulcer Cleansing: Rinsed/Irrigated with Saline Topical Anesthetic Applied: Other: lidocaine 4%, Treatment Notes Wound #1 (Right, Plantar Toe Great) 1. Cleansed with: Clean wound with Normal Saline 2. Anesthetic Topical Lidocaine 4% cream to wound bed prior to debridement 4. Dressing Applied: Prisma Ag 5. Secondary Dressing Applied Dry Gauze Kerlix/Conform Notes darco Electronic Signature(s) Signed: 02/14/2018 2:39:50 PM By: Secundino Ginger Entered By: Secundino Ginger on 02/14/2018 14:08:00 [place], [place] ([id number]) -------------------------------------------------------------------------------- Vitals Details Patient Name: [name] [name] Date of Service: 02/14/2018 2:30 PM Medical Record Number: [id number] Patient Account Number: [id number] Date of Birth/Sex: December 29, 1960 (56 y.o. M) Treating RN: Secundino Ginger Primary Care Damonie Furney: Soyla Dryer Other Clinician: Referring Kamyra Schroeck: Soyla Dryer Treating Shakyla Nolley/Extender: Tito Dine in Treatment:  3 Vital Signs Time Taken: 14:00 Temperature (F): 98.2 Height (in): 70 Pulse (bpm): 96 Weight (lbs): 139 Respiratory Rate (breaths/min): 18 Body Mass Index (BMI): 19.9 Blood Pressure (mmHg): 115/70 Reference Range: 80 - 120 mg / dl Electronic Signature(s) Signed: 02/14/2018 2:39:50 PM By: Secundino Ginger Entered By: Secundino Ginger on 02/14/2018 14:03:03

## 2018-02-18 NOTE — Progress Notes (Signed)
[name] [name] ([id number]) Visit Report for 02/14/2018 Debridement Details Patient Name: [name] [name], [name] [name] Date of Service: 02/14/2018 2:30 PM Medical Record Number: [id number] Patient Account Number: [id number] Date of Birth/Sex: 17-Mar-1961 (57 y.o. M) Treating RN: Cornell Barman Primary Care Provider: Soyla Dryer Other Clinician: Referring Provider: Soyla Dryer Treating Provider/Extender: Tito Dine in Treatment: 3 Debridement Performed for Wound #1 Right,Plantar Toe Great Assessment: Performed By: Physician Ricard Dillon, MD Debridement Type: Debridement Severity of Tissue Pre Fat layer exposed Debridement: Pre-procedure Verification/Time Yes - 14:30 Out Taken: Start Time: 14:31 Pain Control: Other : lidoCAINE 4% Total Area Debrided (L x W): 0.9 (cm) x 0.9 (cm) = 0.81 (cm) Tissue and other material Viable, Non-Viable, Callus, Slough, Subcutaneous, Slough debrided: Level: Skin/Subcutaneous Tissue Debridement Description: Excisional Instrument: Curette Bleeding: Minimum Hemostasis Achieved: Pressure End Time: 14:31 Response to Treatment: Procedure was tolerated well Level of Consciousness: Awake and Alert Post Debridement Measurements of Total Wound Length: (cm) 0.9 Width: (cm) 0.9 Depth: (cm) 0.4 Volume: (cm) 0.254 Character of Wound/Ulcer Post Debridement: Requires Further Debridement Severity of Tissue Post Debridement: Fat layer exposed Post Procedure Diagnosis Same as Pre-procedure Electronic Signature(s) Signed: 02/14/2018 4:51:46 PM By: Linton Ham MD Signed: 02/15/2018 5:22:10 PM By: Gretta Cool, BSN, RN, CWS, Kim RN, BSN Previous Signature: 02/14/2018 2:39:50 PM Version By: Secundino Ginger Entered By: Linton Ham on 02/14/2018 14:54:33 Klingerman, Jsaon ([id number]) -------------------------------------------------------------------------------- HPI Details Patient Name: [name] [name] Date of Service: 02/14/2018 2:30 PM Medical Record Number:  [id number] Patient Account Number: [id number] Date of Birth/Sex: [birth year] (57 y.o. M) Treating RN: Cornell Barman Primary Care Provider: Soyla Dryer Other Clinician: Referring Provider: Soyla Dryer Treating Provider/Extender: Tito Dine in Treatment: 3 History of Present Illness HPI Description: 01/24/18-He is seen in initial evaluation for right great toe ulcer. He was sent to the emergency room on 718 from the nurse clinic office for new-onset diabetes with an A1c of 12.9. He was discharged from the emergency room on metformin, had a right foot x-ray that was negative for osteomyelitis and had a follow-up with his primary care at the free clinic on 7/22. He has been treated twice with antibiotic therapy; bactrim (7/8) and doxycycline (7/18). He is neuropathic. He tolerated debridement; bone is palpable through a thin layer of tissue; there is, what appears to be, resolving erythema and edema; a culture was taken, antibiotics will be prescribed as needed based on sensitivities. 01/31/18; this is a patient with new onset type 2 diabetes who is on oral agents. Culture from last time showed a few Klebsiella and a few enterococcus is being appropriately treated with a combination of Augmentin and ciprofloxacin for 10 days which I think he just started yesterday. He has to get his medications through the free clinic in [place]. He has no insurance. Been using Prisma to the wound. Previous x-ray last month was negative for osteomyelitis.his ABIs look reasonable. He has an offloading sandal 02/07/18; he completes his antibiotics which are Augmentin and ciprofloxacin on Saturday. Using silver collagen. Once again he has callus around the wound and not a very viable looking wound surface. We have been giving him an offloading sandal but I think he is going to require more offloading than that. Previous x-ray was negative. ABIs in our clinic quite reasonable 02/14/18; he arrives today  with the wound dimensions slightly smaller. Still callus nonviable subcutaneous tissue around the wound circumference. He is still in a offloading shoe rather than the Darco. I transitioned him into the [employer])  Signed: 02/14/2018 4:51:46 PM By: Linton Ham MD Entered By: Linton Ham on 02/14/2018 14:55:30 [place], Damarius ([id number]) -------------------------------------------------------------------------------- Physical Exam Details Patient Name: [name] [name] Date of Service: 02/14/2018 2:30 PM Medical Record Number: [id number] Patient Account Number: [id number] Date of Birth/Sex: [birth year] (56 y.o. M) Treating RN: Cornell Barman Primary Care Provider: Soyla Dryer Other Clinician: Referring Provider: Soyla Dryer Treating Provider/Extender: Tito Dine in Treatment: 3 Constitutional Sitting or standing Blood Pressure is within target range for patient.. Pulse regular and within target range for patient.Marland Kitchen Respirations regular, non-labored and within target range.. Temperature is normal and within the target range for the patient.Marland Kitchen appears in no distress. Notes wound exam; the area on the right plantar great toe. Dimensions are slightly better. Still the nonviable callous and subcutaneous tissue around the wound circumference that requires debridement. Hemostasis with direct pressure. There is no evidence of surrounding infection. Still some depth in the center of this but no palpable bone Electronic Signature(s) Signed: 02/14/2018 4:51:46 PM By: Linton Ham MD Entered By: Linton Ham on 02/14/2018 14:56:42 Dewar, O[place]a ([id number]) -------------------------------------------------------------------------------- Physician Orders Details Patient Name: [name] [name] Date of Service: 02/14/2018 2:30 PM Medical Record Number: [id number] Patient Account Number: [id number] Date of Birth/Sex: [birth year] (57 y.o. M) Treating RN: Secundino Ginger Primary Care Provider: Soyla Dryer Other Clinician: Referring Provider: Soyla Dryer Treating Provider/Extender: Tito Dine in Treatment: 3 Verbal / Phone Orders: No Diagnosis Coding Wound Cleansing Wound #1 Right,Plantar Toe Great o Clean wound with Normal Saline. Anesthetic (add to Medication List) Wound #1 Right,Plantar Toe Great o Topical Lidocaine 4% cream applied to wound bed prior to debridement (In Clinic Only). Primary Wound Dressing Wound #1 Right,Plantar Toe Great o Silver Collagen Secondary Dressing Wound #1 Right,Plantar Toe Great o Gauze and Kerlix/Conform Dressing Change Frequency Wound #1 Right,Plantar Toe Great o Change Dressing Monday, Wednesday, Friday Follow-up Appointments Wound #1 Right,Plantar Toe Great o Return Appointment in 1 week. Off-Loading Wound #1 Right,Plantar Toe Great o Open toe surgical shoe to: Banker Orders / Instructions Wound #1 Right,Plantar Toe Great o Increase protein intake. Medications-please add to medication list. Wound #1 Right,Plantar Toe Great o P.O. Antibiotics - Complete Antibiotics Electronic Signature(s) Signed: 02/14/2018 2:39:50 PM By: Secundino Ginger Signed: 02/14/2018 4:51:46 PM By: Linton Ham MD [place], [place] ([id number]) Entered By: Secundino Ginger on 02/14/2018 14:32:49 Spurrier, Salil ([id number]) -------------------------------------------------------------------------------- Problem List Details Patient Name: [name] [name] Date of Service: 02/14/2018 2:30 PM Medical Record Number: [id number] Patient Account Number: [id number] Date of Birth/Sex: [birth year] (57 y.o. 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 M) Treating RN: Cornell Barman Primary Care Provider: Soyla Dryer Other Clinician: Referring Provider: Soyla Dryer Treating Provider/Extender: Tito Dine in Treatment: 3 Subjective History of Present Illness (HPI) 01/24/18-He is seen in initial  evaluation for right great toe ulcer. He was sent to the emergency room on 718 from the nurse clinic office for new-onset diabetes with an A1c of 12.9. He was discharged from the emergency room on metformin, had a right foot x-ray that was negative for osteomyelitis and had a follow-up with his primary care at the free clinic on 7/22. He has been treated twice with antibiotic therapy; bactrim (7/8) and doxycycline (7/18). He is neuropathic. He tolerated debridement; bone is palpable through a thin layer of tissue; there is, what appears to be, resolving erythema and edema; a culture was taken, antibiotics will be prescribed as needed based on sensitivities. 01/31/18; this is a patient with new onset type 2 diabetes who is on oral agents. Culture from last time showed a few Klebsiella and a few enterococcus is being appropriately treated with a combination of Augmentin and ciprofloxacin for 10 days which I think he just started  yesterday. He has to get his medications through the free clinic in [place]. He has no insurance. Been using Prisma to the wound. Previous x-ray last month was negative for osteomyelitis.his ABIs look reasonable. He has an offloading sandal 02/07/18; he completes his antibiotics which are Augmentin and ciprofloxacin on Saturday. Using silver collagen. Once again he has callus around the wound and not a very viable looking wound surface. We have been giving him an offloading sandal but I think he is going to require more offloading than that. Previous x-ray was negative. ABIs in our clinic quite reasonable 02/14/18; he arrives today with the wound dimensions slightly smaller. Still callus nonviable subcutaneous tissue around the wound circumference. He is still in a offloading shoe rather than the Darco. I transitioned him into the Darco today Objective Constitutional Sitting or standing Blood Pressure is within target range for patient.. Pulse regular and within target range for patient.Marland Kitchen Respirations regular, non-labored and within target range.. Temperature is normal and within the target range for the patient.Marland Kitchen appears in no distress. Vitals Time Taken: 2:00 PM, Height: 70 in, Weight: 139 lbs, BMI: 19.9, Temperature: 98.2 F, Pulse: 96 bpm, Respiratory Rate: 18 breaths/min, Blood Pressure: 115/70 mmHg. General Notes: wound exam; the area on the right plantar great toe. Dimensions are slightly better. Still the nonviable callous and subcutaneous tissue around the wound circumference that requires debridement. Hemostasis with direct pressure. There is no evidence of surrounding infection. Still some depth in the center of this but no palpable bone Integumentary (Hair, Skin) Wound #1 status is Open. Original cause of wound was Gradually Appeared. The wound is located on the [employer]. The wound measures 0.9cm length x 0.9cm width x 0.4cm depth; 0.636cm^2 area and 0.254cm^3 volume.  There is Fat Layer (Subcutaneous Tissue) Exposed exposed. There is no tunneling or undermining noted. There is a large amount of Kellis, Jamyron ([id number]) serous drainage noted. The wound margin is distinct with the outline attached to the wound base. There is small (1-33%) red, pink granulation within the wound bed. There is a large (67-100%) amount of necrotic tissue within the wound bed including Eschar. The periwound skin appearance exhibited: Callus. The periwound skin appearance did not exhibit: Crepitus, Excoriation, Induration, Rash, Scarring, Dry/Scaly, Maceration, Atrophie Blanche, Cyanosis, Ecchymosis, Hemosiderin Staining, Mottled, Pallor, Rubor, Erythema. Periwound temperature was noted as No Abnormality. The periwound has tenderness on palpation. Assessment Active Problems ICD-10 Type 2 diabetes mellitus with foot ulcer Non-pressure chronic ulcer of other part of right foot with necrosis of muscle Type 2 diabetes mellitus with  diabetic neuropathy, unspecified Procedures Wound #1 Pre-procedure diagnosis of Wound #1 is a Diabetic Wound/Ulcer of the Lower Extremity located on the Right,Plantar Toe Great .Severity of Tissue Pre Debridement is: Fat layer exposed. There was a Excisional Skin/Subcutaneous Tissue Debridement with a total area of 0.81 sq cm performed by Ricard Dillon, MD. With the following instrument(s): Curette to remove Viable and Non-Viable tissue/material. Material removed includes Callus, Subcutaneous Tissue, and Slough after achieving pain control using Other (lidoCAINE 4%). No specimens were taken. A time out was conducted at 14:30, prior to the start of the procedure. A Minimum amount of bleeding was controlled with Pressure. The procedure was tolerated well. Patient s Level of Consciousness post procedure was recorded as Awake and Alert. Post Debridement Measurements: 0.9cm length x 0.9cm width x 0.4cm depth; 0.254cm^3 volume. Character of Wound/Ulcer  Post Debridement requires further debridement. Severity of Tissue Post Debridement is: Fat layer exposed. Post procedure Diagnosis Wound #1: Same as Pre-Procedure Plan Wound Cleansing: Wound #1 Right,Plantar Toe Great: Clean wound with Normal Saline. Anesthetic (add to Medication List): Wound #1 Right,Plantar Toe Great: Topical Lidocaine 4% cream applied to wound bed prior to debridement (In Clinic Only). Primary Wound Dressing: Wound #1 Right,Plantar Toe Great: Silver Collagen Secondary Dressing: Wound #1 Right,Plantar Toe Great: Gauze and Kerlix/Conform [place], [place]ey ([id number]) Dressing Change Frequency: Wound #1 Right,Plantar Toe Great: Change Dressing Monday, Wednesday, Friday Follow-up Appointments: Wound #1 Right,Plantar Toe Great: Return Appointment in 1 week. Off-Loading: Wound #1 Right,Plantar Toe Great: Open toe surgical shoe to: Banker Orders / Instructions: Wound #1 Right,Plantar Toe Great: Increase protein intake. Medications-please add to medication list.: Wound #1 Right,Plantar Toe Great: P.O. Antibiotics - Complete Antibiotics #1 Silver collagen,Kirlix and wrap #2I put him in a Darco forefoot off loader. I am hopeful that his balance will handle this. #3 I see no evidence currently of underlying infection Electronic Signature(s) Signed: 02/14/2018 4:51:46 PM By: Linton Ham MD Entered By: Linton Ham on 02/14/2018 14:58:39 [place], [place] ([id number]) -------------------------------------------------------------------------------- [place] Details Patient Name: [name] [name] Date of Service: 02/14/2018 Medical Record Number: [id number] Patient Account Number: [id number] Date of Birth/Sex: [birth year] (57 y.o. M) Treating RN: Cornell Barman Primary Care Provider: Soyla Dryer Other Clinician: Referring Provider: Soyla Dryer Treating Provider/Extender: Tito Dine in Treatment: 3 Diagnosis Coding ICD-10  Codes Code Description E11.621 Type 2 diabetes mellitus with foot ulcer L97.513 Non-pressure chronic ulcer of other part of right foot with necrosis of muscle E11.40 Type 2 diabetes mellitus with diabetic neuropathy, unspecified Facility Procedures CPT4 Code Description: [id number] - DEB SUBQ TISSUE 20 SQ CM/< ICD-10 Diagnosis Description L97.513 Non-pressure chronic ulcer of other part of right foot with necr Modifier: osis of muscle Quantity: 1 Physician Procedures CPT4 Code Description: [id number] - WC PHYS SUBQ TISS 20 SQ CM ICD-10 Diagnosis Description L97.513 Non-pressure chronic ulcer of other part of right foot with necr Modifier: osis of muscle Quantity: 1 Electronic Signature(s) Signed: 02/14/2018 4:51:46 PM By: Linton Ham MD Entered By: Linton Ham on 02/14/2018 14:59:01

## 2018-02-21 ENCOUNTER — Encounter: Payer: Self-pay | Admitting: Internal Medicine

## 2018-02-23 NOTE — Progress Notes (Signed)
KEILYN, NADAL ([id number]) Visit Report for 02/21/2018 Debridement Details Patient Name: [name] [name], [name] [name] Date of Service: 02/21/2018 2:30 PM Medical Record Number: [id number] Patient Account Number: [id number] Date of Birth/Sex: 06/12/1961 (57 y.o. M) Treating RN: Montey Hora Primary Care Provider: Soyla Dryer Other Clinician: Referring Provider: Soyla Dryer Treating Provider/Extender: Tito Dine in Treatment: 4 Debridement Performed for Wound #1 Right,Plantar Toe Great Assessment: Performed By: Physician Ricard Dillon, MD Debridement Type: Debridement Severity of Tissue Pre Fat layer exposed Debridement: Pre-procedure Verification/Time Yes - 15:04 Out Taken: Start Time: 15:04 Pain Control: Lidocaine 4% Topical Solution Total Area Debrided (L x W): 0.8 (cm) x 0.7 (cm) = 0.56 (cm) Tissue and other material Viable, Non-Viable, Callus, Slough, Subcutaneous, Slough debrided: Level: Skin/Subcutaneous Tissue Debridement Description: Excisional Instrument: Curette Bleeding: Minimum Hemostasis Achieved: Pressure End Time: 15:06 Procedural Pain: 0 Post Procedural Pain: 0 Response to Treatment: Procedure was tolerated well Level of Consciousness: Awake and Alert Post Debridement Measurements of Total Wound Length: (cm) 0.8 Width: (cm) 0.7 Depth: (cm) 0.4 Volume: (cm) 0.176 Character of Wound/Ulcer Post Debridement: Improved Severity of Tissue Post Debridement: Fat layer exposed Post Procedure Diagnosis Same as Pre-procedure Electronic Signature(s) Signed: 02/21/2018 5:20:22 PM By: Montey Hora Signed: 02/21/2018 5:30:50 PM By: Linton Ham MD Entered By: Linton Ham on 02/21/2018 15:31:57 [place], Jahir ([id number]) -------------------------------------------------------------------------------- HPI Details Patient Name: [name] [name] Date of Service: 02/21/2018 2:30 PM Medical Record Number: [id number] Patient Account Number:  [id number] Date of Birth/Sex: 02-21-1961 (56 y.o. M) Treating RN: Montey Hora Primary Care Provider: Soyla Dryer Other Clinician: Referring Provider: Soyla Dryer Treating Provider/Extender: Tito Dine in Treatment: 4 History of Present Illness HPI Description: 01/24/18-He is seen in initial evaluation for right great toe ulcer. He was sent to the emergency room on 718 from the nurse clinic office for new-onset diabetes with an A1c of 12.9. He was discharged from the emergency room on metformin, had a right foot x-ray that was negative for osteomyelitis and had a follow-up with his primary care at the free clinic on 7/22. He has been treated twice with antibiotic therapy; bactrim (7/8) and doxycycline (7/18). He is neuropathic. He tolerated debridement; bone is palpable through a thin layer of tissue; there is, what appears to be, resolving erythema and edema; a culture was taken, antibiotics will be prescribed as needed based on sensitivities. 01/31/18; this is a patient with new onset type 2 diabetes who is on oral agents. Culture from last time showed a few Klebsiella and a few enterococcus is being appropriately treated with a combination of Augmentin and ciprofloxacin for 10 days which I think he just started yesterday. He has to get his medications through the free clinic in [place]. He has no insurance. Been using Prisma to the wound. Previous x-ray last month was negative for osteomyelitis.his ABIs look reasonable. He has an offloading sandal 02/07/18; he completes his antibiotics which are Augmentin and ciprofloxacin on Saturday. Using silver collagen. Once again he has callus around the wound and not a very viable looking wound surface. We have been giving him an offloading sandal but I think he is going to require more offloading than that. Previous x-ray was negative. ABIs in our clinic quite reasonable 02/14/18; he arrives today with the wound dimensions  slightly smaller. Still callus nonviable subcutaneous tissue around the wound circumference. He is still in a offloading shoe rather than the Darco. I transitioned him into the Darco today 02/21/18; slight improvement in dimensions. Still aggressive debridements using Prisma. He  is using a Clinical research associate) Signed: 02/21/2018 5:30:50 PM By: Linton Ham MD Entered By: Linton Ham on 02/21/2018 15:34:17 [place], [place] ([id number]) -------------------------------------------------------------------------------- Physical Exam Details Patient Name: [name] [name] Date of Service: 02/21/2018 2:30 PM Medical Record Number: [id number] Patient Account Number: [id number] Date of Birth/Sex: August 05, 1960 (56 y.o. M) Treating RN: Montey Hora Primary Care Provider: Soyla Dryer Other Clinician: Referring Provider: Soyla Dryer Treating Provider/Extender: Tito Dine in Treatment: 4 Constitutional Sitting or standing Blood Pressure is within target range for patient.. Pulse regular and within target range for patient.Marland Kitchen Respirations regular, non-labored and within target range.. Temperature is normal and within the target range for the patient.Marland Kitchen appears in no distress. Notes wound exam; the area on the right plantar to perhaps slightly smaller. Still requires aggressive debridement with callus and thick skin around the wound and necrotic debris over the wound surface. As usual this cleans up quite nicely. Doesn't seem to have any evidence of infection Electronic Signature(s) Signed: 02/21/2018 5:30:50 PM By: Linton Ham MD Entered By: Linton Ham on 02/21/2018 15:35:53 [place], [place] ([id number]) -------------------------------------------------------------------------------- Physician Orders Details Patient Name: [name] [name] Date of Service: 02/21/2018 2:30 PM Medical Record Number: [id number] Patient Account Number:  [id number] Date of Birth/Sex: 1961/02/20 (57 y.o. M) Treating RN: Montey Hora Primary Care Provider: Soyla Dryer Other Clinician: Referring Provider: Soyla Dryer Treating Provider/Extender: Tito Dine in Treatment: 4 Verbal / Phone Orders: No Diagnosis Coding Wound Cleansing Wound #1 Right,Plantar Toe Great o Clean wound with Normal Saline. Anesthetic (add to Medication List) Wound #1 Right,Plantar Toe Great o Topical Lidocaine 4% cream applied to wound bed prior to debridement (In Clinic Only). Primary Wound Dressing Wound #1 Right,Plantar Toe Great o Silver Collagen Secondary Dressing Wound #1 Right,Plantar Toe Great o Gauze and Kerlix/Conform Dressing Change Frequency Wound #1 Right,Plantar Toe Great o Change Dressing Monday, Wednesday, Friday Follow-up Appointments Wound #1 Right,Plantar Toe Great o Return Appointment in 1 week. Off-Loading Wound #1 Right,Plantar Toe Great o Open toe surgical shoe to: Banker Orders / Instructions Wound #1 Right,Plantar Toe Great o Increase protein intake. Electronic Signature(s) Signed: 02/21/2018 5:20:22 PM By: Montey Hora Signed: 02/21/2018 5:30:50 PM By: Linton Ham MD Entered By: Montey Hora on 02/21/2018 15:06:50 [place], Jewelz ([id number]) -------------------------------------------------------------------------------- Problem List Details Patient Name: [name] [name] Date of Service: 02/21/2018 2:30 PM Medical Record Number: [id number] Patient Account Number: [id number] Date of Birth/Sex: August 18, 1960 (57 y.o. 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 M) Treating RN: Montey Hora Primary Care Provider: Soyla Dryer Other Clinician: Referring Provider: Soyla Dryer Treating Provider/Extender: Tito Dine in Treatment: 4 Subjective History of Present Illness (HPI) 01/24/18-He is seen in initial evaluation for right great toe ulcer. He was sent to the emergency room on 718  from the nurse clinic office for new-onset diabetes with an A1c of 12.9. He was discharged from the emergency room on metformin, had a right foot x-ray that was negative for osteomyelitis and had a follow-up with his primary care at the free clinic on 7/22. He has been treated twice with antibiotic therapy; bactrim (7/8) and doxycycline (7/18). He is neuropathic. He tolerated debridement; bone is palpable through a thin layer of tissue; there is, what appears to be, resolving erythema and edema; a culture was taken, antibiotics will be prescribed as needed based on sensitivities. 01/31/18; this is a patient with new onset type 2 diabetes who is on oral agents. Culture from last time showed a few Klebsiella and a few enterococcus is being appropriately treated with a combination of Augmentin and ciprofloxacin for 10 days which I think he just started yesterday. He has to get his medications through the  free clinic in [place]. He has no insurance. Been using Prisma to the wound. Previous x-ray last month was negative for osteomyelitis.his ABIs look reasonable. He has an offloading sandal 02/07/18; he completes his antibiotics which are Augmentin and ciprofloxacin on Saturday. Using silver collagen. Once again he has callus around the wound and not a very viable looking wound surface. We have been giving him an offloading sandal but I think he is going to require more offloading than that. Previous x-ray was negative. ABIs in our clinic quite reasonable 02/14/18; he arrives today with the wound dimensions slightly smaller. Still callus nonviable subcutaneous tissue around the wound circumference. He is still in a offloading shoe rather than the Darco. I transitioned him into the Darco today 02/21/18; slight improvement in dimensions. Still aggressive debridements using Prisma. He is using a Darco forefoot offloading boot Objective Constitutional Sitting or standing Blood Pressure is within target range for patient.. Pulse regular and within target range for patient.Marland Kitchen Respirations regular, non-labored and within target range.. Temperature is normal and within the target range for the patient.Marland Kitchen appears in no distress. Vitals Time Taken: 2:26 PM, Height: 70 in, Weight: 139 lbs, BMI: 19.9, Temperature: 98.3 F, Pulse: 89 bpm, Respiratory Rate: 18 breaths/min, Blood Pressure: 124/70 mmHg. General Notes: wound exam; the area on the right plantar to perhaps slightly smaller. Still requires aggressive debridement with callus and thick skin around the wound and necrotic debris over the wound surface. As usual this cleans up quite nicely. Doesn't seem to have any evidence of infection Integumentary (Hair, Skin) Wound #1 status is Open. Original cause of wound was Gradually Appeared. The wound is located on the [place], [place] ([id number]) Great. The wound measures 0.8cm length x 0.7cm  width x 0.3cm depth; 0.44cm^2 area and 0.132cm^3 volume. There is Fat Layer (Subcutaneous Tissue) Exposed exposed. There is no tunneling or undermining noted. There is a large amount of serous drainage noted. The wound margin is distinct with the outline attached to the wound base. There is small (1-33%) red, pink granulation within the wound bed. There is a large (67-100%) amount of necrotic tissue within the wound bed including Eschar. The periwound skin appearance exhibited: Callus. The periwound skin appearance did not exhibit: Crepitus, Excoriation, Induration, Rash, Scarring, Dry/Scaly, Maceration, Atrophie Blanche, Cyanosis, Ecchymosis, Hemosiderin Staining, Mottled, Pallor, Rubor, Erythema. Periwound temperature was noted as No Abnormality. The periwound has tenderness on palpation. Assessment Active Problems ICD-10 Type 2 diabetes mellitus with foot ulcer Non-pressure chronic ulcer of other part of right foot with necrosis of muscle Type 2 diabetes mellitus with diabetic neuropathy,  unspecified Procedures Wound #1 Pre-procedure diagnosis of Wound #1 is a Diabetic Wound/Ulcer of the Lower Extremity located on the Right,Plantar Toe Great .Severity of Tissue Pre Debridement is: Fat layer exposed. There was a Excisional Skin/Subcutaneous Tissue Debridement with a total area of 0.56 sq cm performed by Ricard Dillon, MD. With the following instrument(s): Curette to remove Viable and Non-Viable tissue/material. Material removed includes Callus, Subcutaneous Tissue, and Slough after achieving pain control using Lidocaine 4% Topical Solution. No specimens were taken. A time out was conducted at 15:04, prior to the start of the procedure. A Minimum amount of bleeding was controlled with Pressure. The procedure was tolerated well with a pain level of 0 throughout and a pain level of 0 following the procedure. Patient s Level of Consciousness post procedure was recorded as Awake and Alert.  Post Debridement Measurements: 0.8cm length x 0.7cm width x 0.4cm depth; 0.176cm^3 volume. Character of Wound/Ulcer Post Debridement is improved. Severity of Tissue Post Debridement is: Fat layer exposed. Post procedure Diagnosis Wound #1: Same as Pre-Procedure Plan Wound Cleansing: Wound #1 Right,Plantar Toe Great: Clean wound with Normal Saline. Anesthetic (add to Medication List): Wound #1 Right,Plantar Toe Great: Topical Lidocaine 4% cream applied to wound bed prior to debridement (In Clinic Only). Primary Wound Dressing: Wound #1 Right,Plantar Toe Great: Silver Collagen Secondary Dressing: HIPPE, Seon ([id number]) Wound #1 Right,Plantar Toe Great: Gauze and Kerlix/Conform Dressing Change Frequency: Wound #1 Right,Plantar Toe Great: Change Dressing Monday, Wednesday, Friday Follow-up Appointments: Wound #1 Right,Plantar Toe Great: Return Appointment in 1 week. Off-Loading: Wound #1 Right,Plantar Toe Great: Open toe surgical shoe to: Banker Orders / Instructions: Wound #1 Right,Plantar Toe Great: Increase protein intake. #1 I'm going to continue the Silver collagen #2 he claims to be vigorously offloading this although I have to wonder with the amount of callus/thick tissue around the wound. He doesn't have balance enough to consider a total contact cast. I had some concerns about the Darco boot he is using although he seems to be tolerating it Electronic Signature(s) Signed: 02/21/2018 5:30:50 PM By: Linton Ham MD Entered By: Linton Ham on 02/21/2018 15:36:46 [place], [place] ([id number]) -------------------------------------------------------------------------------- Goodnight Details Patient Name: [name] [name] Date of Service: 02/21/2018 Medical Record Number: [id number] Patient Account Number: [id number] Date of Birth/Sex: 06-26-1960 (57 y.o. M) Treating RN: Montey Hora Primary Care Provider: Soyla Dryer Other  Clinician: Referring Provider: Soyla Dryer Treating Provider/Extender: Tito Dine in Treatment: 4 Diagnosis Coding ICD-10 Codes Code Description E11.621 Type 2 diabetes mellitus with foot ulcer L97.513 Non-pressure chronic ulcer of other part of right foot with necrosis of muscle E11.40 Type 2 diabetes mellitus with diabetic neuropathy, unspecified Facility Procedures CPT4 Code Description: [id number] 11042 - DEB SUBQ TISSUE 20 SQ CM/< ICD-10 Diagnosis Description E11.621 Type 2 diabetes mellitus with foot ulcer L97.513 Non-pressure chronic ulcer of other part of right foot with necr Modifier: osis of muscle Quantity: 1 Physician Procedures CPT4 Code Description: [id number] - WC PHYS SUBQ TISS 20 SQ CM ICD-10 Diagnosis Description E11.621 Type 2 diabetes mellitus with foot ulcer L97.513 Non-pressure chronic ulcer of other part of right foot with necr Modifier: osis of muscle Quantity: 1 Electronic Signature(s) Signed: 02/21/2018 5:30:50 PM By: Linton Ham MD Entered By: Linton Ham on 02/21/2018 15:37:10

## 2018-02-23 NOTE — Progress Notes (Signed)
[name] [name] ([id number]) Visit Report for 02/21/2018 Arrival Information Details Patient Name: [name] [name], [name] [name] Date of Service: 02/21/2018 2:30 PM Medical Record Number: [id number] Patient Account Number: [id number] Date of Birth/Sex: 01-Jun-1961 (57 y.o. M) Treating RN: [name] [name] Primary Care [name] [name]: [name] [name] Other Clinician: Referring [name] [name]: [name] [name] Treating [name] [name]/Extender: [name] [name] in Treatment: 4 Visit Information History Since Last Visit Added or deleted any medications: No Patient Arrived: Ambulatory Any new allergies or adverse reactions: No Arrival Time: 14:27 Had a fall or experienced change in No Accompanied By: self activities of daily living that may affect Transfer Assistance: None risk of falls: Patient Identification Verified: Yes Signs or symptoms of abuse/neglect since last visito No Secondary Verification Process Completed: Yes Hospitalized since last visit: No Implantable device outside of the clinic excluding No cellular tissue based products placed in the center since last visit: Has Dressing in Place as Prescribed: Yes Pain Present Now: No Electronic Signature(s) Signed: 02/21/2018 3:03:26 PM By: [name] [name] RCP, RRT, CHT Entered By: [name] [name] on 02/21/2018 14:27:36 [place], [name] [name] ([id number]) -------------------------------------------------------------------------------- Encounter Discharge Information Details Patient Name: [name] [name] Date of Service: 02/21/2018 2:30 PM Medical Record Number: [id number] Patient Account Number: [id number] Date of Birth/Sex: Feb 09, 1961 (57 y.o. M) Treating RN: [name] [name] Primary Care Perl Folmar: [name] [name] Other Clinician: Referring [name] [name]: [name] [name] Treating [name] [name]/Extender: [name] [name] in Treatment: 4 Encounter Discharge Information Items Discharge Condition: Stable Ambulatory Status: Ambulatory Discharge  Destination: Home Transportation: Private Auto Schedule Follow-up Appointment: Yes Clinical Summary of Care: Electronic Signature(s) Signed: 02/21/2018 5:20:22 PM By: [name] [name] Entered By: [name] [name] on 02/21/2018 15:09:54 [place], [name] [name] ([id number]) -------------------------------------------------------------------------------- Lower Extremity Assessment Details Patient Name: [name] [name] Date of Service: 02/21/2018 2:30 PM Medical Record Number: [id number] Patient Account Number: [id number] Date of Birth/Sex: 1961-03-28 (57 y.o. M) Treating RN: [name] [name] Primary Care Joseantonio Dittmar: [name] [name] Other Clinician: Referring [name] [name]: [name] [name] Treating [name] [name]/Extender: [name] [name] in Treatment: 4 Edema Assessment Assessed: [Left: No] [Right: No] Edema: [Left: N] [Right: o] Vascular Assessment Pulses: Dorsalis Pedis Palpable: [Right:Yes] Posterior Tibial Extremity colors, hair growth, and conditions: Extremity Color: [Right:Normal] Hair Growth on Extremity: [Right:Yes] Temperature of Extremity: [Right:Warm] Capillary Refill: [Right:< 3 seconds] Toe Nail Assessment Left: Right: Thick: Yes Discolored: Yes Deformed: No Improper Length and Hygiene: No Electronic Signature(s) Signed: 02/21/2018 5:20:22 PM By: [name] [name] Entered By: [name] [name] on 02/21/2018 14:44:34 [place], [name] [name] ([id number]) -------------------------------------------------------------------------------- Multi Wound Chart Details Patient Name: [name] [name] Date of Service: 02/21/2018 2:30 PM Medical Record Number: [id number] Patient Account Number: [id number] Date of Birth/Sex: 16-Aug-1960 (57 y.o. M) Treating RN: [name] [name] Primary Care [name] [name]: [name] [name] Other Clinician: Referring [name] [name]: [name] [name] Treating [name] [name]/Extender: [name] [name] in Treatment: 4 Vital Signs Height(in): 70 Pulse(bpm): 42 Weight(lbs): 139 Blood  Pressure(mmHg): 124/70 Body Mass Index(BMI): 20 Temperature(F): 98.3 Respiratory Rate 18 (breaths/min): Photos: [N/A:N/A] Wound Location: Right Toe Great - Plantar N/A N/A Wounding Event: Gradually Appeared N/A N/A Primary Etiology: Diabetic Wound/Ulcer of the N/A N/A Lower Extremity Comorbid History: Cataracts, Hypertension, Type N/A N/A II Diabetes, Rheumatoid Arthritis, Neuropathy Date Acquired: 12/24/2017 N/A N/A Weeks of Treatment: 4 N/A N/A Wound Status: Open N/A N/A Measurements L x W x D 0.8x0.7x0.3 N/A N/A (cm) Area (cm) : 0.44 N/A N/A Volume (cm) : 0.132 N/A N/A % Reduction in Area: 20.00% N/A N/A % Reduction in Volume: 60.00% N/A N/A Classification: Grade 2 N/A N/A Exudate Amount: Large N/A N/A Exudate Type: Serous N/A N/A Exudate Color:  amber N/A N/A Wound Margin: Distinct, outline attached N/A N/A Granulation Amount: Small (1-33%) N/A N/A Granulation Quality: Red, Pink N/A N/A Necrotic Amount: Large (67-100%) N/A N/A Necrotic Tissue: Eschar N/A N/A Exposed Structures: Fat Layer (Subcutaneous N/A N/A Tissue) Exposed: Yes Fascia: No Tendon: No Dollins, [name] ([id number]) Muscle: No Joint: No Bone: No Epithelialization: None N/A N/A Debridement: Debridement - Excisional N/A N/A Pre-procedure 15:04 N/A N/A Verification/Time Out Taken: Pain Control: Lidocaine 4% Topical Solution N/A N/A Tissue Debrided: Callus, Subcutaneous, Slough N/A N/A Level: Skin/Subcutaneous Tissue N/A N/A Debridement Area (sq cm): 0.56 N/A N/A Instrument: Curette N/A N/A Bleeding: Minimum N/A N/A Hemostasis Achieved: Pressure N/A N/A Procedural Pain: 0 N/A N/A Post Procedural Pain: 0 N/A N/A Debridement Treatment Procedure was tolerated well N/A N/A Response: Post Debridement 0.8x0.7x0.4 N/A N/A Measurements L x W x D (cm) Post Debridement Volume: 0.176 N/A N/A (cm) Periwound Skin Texture: Callus: Yes N/A N/A Excoriation: No Induration: No Crepitus: No Rash:  No Scarring: No Periwound Skin Moisture: Maceration: No N/A N/A Dry/Scaly: No Periwound Skin Color: Atrophie Blanche: No N/A N/A Cyanosis: No Ecchymosis: No Erythema: No Hemosiderin Staining: No Mottled: No Pallor: No Rubor: No Temperature: No Abnormality N/A N/A Tenderness on Palpation: Yes N/A N/A Wound Preparation: Ulcer Cleansing: N/A N/A Rinsed/Irrigated with Saline Topical Anesthetic Applied: Other: lidocaine 4% Procedures Performed: Debridement N/A N/A Treatment Notes Wound #1 (Right, Plantar Toe Great) 1. Cleansed with: Clean wound with Normal Saline 2. Anesthetic Topical Lidocaine 4% cream to wound bed prior to debridement 4. Dressing Applied: Doryan Bahl, [place] ([id number]) 5. Secondary Dressing Applied Dry Gauze Kerlix/Conform Notes darco Electronic Signature(s) Signed: 02/21/2018 5:30:50 PM By: Linton Ham MD Entered By: Linton Ham on 02/21/2018 15:31:48 FRIGON, [place] ([id number]) -------------------------------------------------------------------------------- Multi-Disciplinary Care Plan Details Patient Name: [name] [name] Date of Service: 02/21/2018 2:30 PM Medical Record Number: [id number] Patient Account Number: [id number] Date of Birth/Sex: 1961/04/30 (56 y.o. M) Treating RN: [name] [name] Primary Care Delayza Lungren: [name] [name] Other Clinician: Referring Herley Bernardini: [name] [name] Treating Jocelynn Gioffre/Extender: [name] [name] in Treatment: 4 Active Inactive ` Necrotic Tissue Nursing Diagnoses: Knowledge deficit related to management of necrotic/devitalized tissue Goals: Necrotic/devitalized tissue will be minimized in the wound bed Date Initiated: 01/24/2018 Target Resolution Date: 02/09/2018 Goal Status: Active Interventions: Assess patient pain level pre-, during and post procedure and prior to discharge Treatment Activities: Excisional debridement : 01/24/2018 Notes: ` Orientation to the Wound Care Program Nursing  Diagnoses: Knowledge deficit related to the wound healing center program Goals: Patient/caregiver will verbalize understanding of the [place] Date Initiated: 01/24/2018 Target Resolution Date: 02/09/2018 Goal Status: Active Interventions: Provide education on orientation to the wound center Notes: ` Pressure Nursing Diagnoses: Knowledge deficit related to management of pressures ulcers Goals: Patient will remain free from development of additional pressure ulcers Date Initiated: 01/24/2018 Target Resolution Date: 02/09/2018 MALAKIE, BALIS ([id number]) Goal Status: Active Interventions: Assess potential for pressure ulcer upon admission and as needed Notes: ` Wound/Skin Impairment Nursing Diagnoses: Knowledge deficit related to ulceration/compromised skin integrity Goals: Ulcer/skin breakdown will have a volume reduction of 30% by week 4 Date Initiated: 01/24/2018 Target Resolution Date: 02/24/2018 Goal Status: Active Interventions: Assess ulceration(s) every visit Treatment Activities: Topical wound management initiated : 01/24/2018 Notes: Electronic Signature(s) Signed: 02/21/2018 5:20:22 PM By: [name] [name] Entered By: [name] [name] on 02/21/2018 14:47:43 [place], [place] ([id number]) -------------------------------------------------------------------------------- Pain Assessment Details Patient Name: [name] [name] Date of Service: 02/21/2018 2:30 PM Medical Record Number: [id number] Patient Account Number: [id number] Date of Birth/Sex: September 03, 1960 (  57 y.o. M) Treating RN: [name] [name] Primary Care Armany Mano: [name] [name] Other Clinician: Referring Daivon Rayos: [name] [name] Treating Masayoshi Couzens/Extender: [name] [name] in Treatment: 4 Active Problems Location of Pain Severity and Description of Pain Patient Has Paino No Site Locations Pain Management and Medication Current Pain Management: Electronic Signature(s) Signed: 02/21/2018  3:03:26 PM By: Paulla Fore, RRT, CHT Signed: 02/21/2018 5:20:22 PM By: [name] [name] Entered By: [name] [name] on 02/21/2018 14:27:43 [place], [place] ([id number]) -------------------------------------------------------------------------------- Patient/Caregiver Education Details Patient Name: [name] [name] Date of Service: 02/21/2018 2:30 PM Medical Record Number: [id number] Patient Account Number: [id number] Date of Birth/Gender: [birth year] (57 y.o. M) Treating RN: [name] [name] Primary Care Physician: [name] [name] Other Clinician: Referring Physician: Soyla [name] Treating Physician/Extender: [name] [name] in Treatment: 4 Education Assessment Education Provided To: Patient Education Topics Provided Wound Debridement: Handouts: Wound Debridement Methods: Explain/Verbal Responses: State content correctly Wound/Skin Impairment: Handouts: Caring for Your Ulcer Methods: Explain/Verbal Responses: State content correctly Electronic Signature(s) Signed: 02/21/2018 5:20:22 PM By: [name] [name] Entered By: [name] [name] on 02/21/2018 15:10:09 [place], [place] ([id number]) -------------------------------------------------------------------------------- Wound Assessment Details Patient Name: [name] [name] Date of Service: 02/21/2018 2:30 PM Medical Record Number: [id number] Patient Account Number: [id number] Date of Birth/Sex: Sep 02, 1960 (56 y.o. M) Treating RN: [name] [name] Primary Care Jasman Pfeifle: [name] [name] Other Clinician: Referring Vestal Markin: [name] [name] Treating Abby Stines/Extender: [name] [name] in Treatment: 4 Wound Status Wound Number: 1 Primary Diabetic Wound/Ulcer of the Lower Extremity Etiology: Wound Location: Right Toe Great - Plantar Wound Open Wounding Event: Gradually Appeared Status: Date Acquired: 12/24/2017 Comorbid Cataracts, Hypertension, Type II Diabetes, Weeks Of Treatment: 4 History:  Rheumatoid Arthritis, Neuropathy Clustered Wound: No Photos Photo Uploaded By: [name] [name] on 02/21/2018 15:17:36 Wound Measurements Length: (cm) 0.8 Width: (cm) 0.7 Depth: (cm) 0.3 Area: (cm) 0.44 Volume: (cm) 0.132 % Reduction in Area: 20% % Reduction in Volume: 60% Epithelialization: None Tunneling: No Undermining: No Wound Description Classification: Grade 2 Wound Margin: Distinct, outline attached Exudate Amount: Large Exudate Type: Serous Exudate Color: amber Foul Odor After Cleansing: No Slough/Fibrino Yes Wound Bed Granulation Amount: Small (1-33%) Exposed Structure Granulation Quality: Red, Pink Fascia Exposed: No Necrotic Amount: Large (67-100%) Fat Layer (Subcutaneous Tissue) Exposed: Yes Necrotic Quality: Eschar Tendon Exposed: No Muscle Exposed: No Joint Exposed: No Bone Exposed: No Periwound Skin Texture Sheen, Antonia ([id number]) Texture Color No Abnormalities Noted: No No Abnormalities Noted: No Callus: Yes Atrophie Blanche: No Crepitus: No Cyanosis: No Excoriation: No Ecchymosis: No Induration: No Erythema: No Rash: No Hemosiderin Staining: No Scarring: No Mottled: No Pallor: No Moisture Rubor: No No Abnormalities Noted: No Dry / Scaly: No Temperature / Pain Maceration: No Temperature: No Abnormality Tenderness on Palpation: Yes Wound Preparation Ulcer Cleansing: Rinsed/Irrigated with Saline Topical Anesthetic Applied: Other: lidocaine 4%, Treatment Notes Wound #1 (Right, Plantar Toe Great) 1. Cleansed with: Clean wound with Normal Saline 2. Anesthetic Topical Lidocaine 4% cream to wound bed prior to debridement 4. Dressing Applied: Prisma Ag 5. Secondary Dressing Applied Dry Gauze Kerlix/Conform Notes darco Electronic Signature(s) Signed: 02/21/2018 5:20:22 PM By: [name] [name] Entered By: [name] [name] on 02/21/2018 14:44:18 [place], [place]  ([id number]) -------------------------------------------------------------------------------- Vitals Details Patient Name: [name] [name] Date of Service: 02/21/2018 2:30 PM Medical Record Number: [id number] Patient Account Number: [id number] Date of Birth/Sex: [birth year] (57 y.o. M) Treating RN: [name] [name] Primary Care Mairlyn Tegtmeyer: [name] [name] Other Clinician: Referring Fabienne Nolasco: [name] [name] Treating Theresia Pree/Extender: [name] [name] in Treatment: 4 Vital Signs Time Taken: 14:26 Temperature (F): 98.3 Height (in):  70 Pulse (bpm): 89 Weight (lbs): 139 Respiratory Rate (breaths/min): 18 Body Mass Index (BMI): 19.9 Blood Pressure (mmHg): 124/70 Reference Range: 80 - 120 mg / dl Electronic Signature(s) Signed: 02/21/2018 3:03:26 PM By: [name] [name] RCP, RRT, CHT Entered By: Becky Sax, Amado Nash on 02/21/2018 14:29:24

## 2018-02-28 ENCOUNTER — Other Ambulatory Visit (HOSPITAL_BASED_OUTPATIENT_CLINIC_OR_DEPARTMENT_OTHER): Payer: Self-pay | Admitting: Internal Medicine

## 2018-02-28 ENCOUNTER — Ambulatory Visit
Admission: RE | Admit: 2018-02-28 | Discharge: 2018-02-28 | Disposition: A | Discharge status: Home / Self Care | Payer: Self-pay | Source: Ambulatory Visit | Attending: Internal Medicine | Admitting: Internal Medicine

## 2018-02-28 ENCOUNTER — Encounter: Payer: Self-pay | Admitting: Internal Medicine

## 2018-02-28 DIAGNOSIS — S81801A Unspecified open wound, right lower leg, initial encounter: Secondary | ICD-10-CM

## 2018-02-28 DIAGNOSIS — S91101A Unspecified open wound of right great toe without damage to nail, initial encounter: Secondary | ICD-10-CM | POA: Insufficient documentation

## 2018-03-03 NOTE — Progress Notes (Signed)
RISHAB, STOUDT ([id number]) Visit Report for 02/28/2018 Arrival Information Details Patient Name: [name] [name], [name] [name] Date of Service: 02/28/2018 2:30 PM Medical Record Number: [id number] Patient Account Number: [id number] Date of Birth/Sex: August 25, 1960 (57 y.o. M) Treating RN: Cornell Barman Primary Care Brylin Stopper: Soyla Dryer Other Clinician: Referring June Vacha: Soyla Dryer Treating Ankush Gintz/Extender: Tito Dine in Treatment: 5 Visit Information History Since Last Visit Added or deleted any medications: No Patient Arrived: Ambulatory Any new allergies or adverse reactions: No Arrival Time: 14:32 Had a fall or experienced change in No Accompanied By: self activities of daily living that may affect Transfer Assistance: None risk of falls: Patient Identification Verified: Yes Signs or symptoms of abuse/neglect since last visito No Secondary Verification Process Completed: Yes Hospitalized since last visit: No Implantable device outside of the clinic excluding No cellular tissue based products placed in the center since last visit: Has Dressing in Place as Prescribed: Yes Pain Present Now: No Electronic Signature(s) Signed: 02/28/2018 3:44:28 PM By: Lorine Bears RCP, RRT, CHT Entered By: Lorine Bears on 02/28/2018 14:32:58 [place], [place] ([id number]) -------------------------------------------------------------------------------- Encounter Discharge Information Details Patient Name: [name] [name] Date of Service: 02/28/2018 2:30 PM Medical Record Number: [id number] Patient Account Number: [id number] Date of Birth/Sex: 30-Mar-1961 (57 y.o. M) Treating RN: Montey Hora Primary Care Aldo Sondgeroth: Soyla Dryer Other Clinician: Referring Micole Delehanty: Soyla Dryer Treating Tangy Drozdowski/Extender: Tito Dine in Treatment: 5 Encounter Discharge Information Items Discharge Condition: Stable Ambulatory Status: Ambulatory Discharge  Destination: Home Transportation: Private Auto Accompanied By: self Schedule Follow-up Appointment: Yes Clinical Summary of Care: Post Procedure Vitals: Temperature (F): 97.6 Pulse (bpm): 91 Respiratory Rate (breaths/min): 18 Blood Pressure (mmHg): 102/69 Electronic Signature(s) Signed: 02/28/2018 3:32:05 PM By: Montey Hora Entered By: Montey Hora on 02/28/2018 15:32:05 Loder, Zavian ([id number]) -------------------------------------------------------------------------------- Lower Extremity Assessment Details Patient Name: [name] [name] Date of Service: 02/28/2018 2:30 PM Medical Record Number: [id number] Patient Account Number: [id number] Date of Birth/Sex: 1961-05-03 (57 y.o. M) Treating RN: Secundino Ginger Primary Care Ewel Lona: Soyla Dryer Other Clinician: Referring Kalum Minner: Soyla Dryer Treating Kennita Pavlovich/Extender: Ricard Dillon Weeks in Treatment: 5 Electronic Signature(s) Signed: 02/28/2018 3:42:50 PM By: Secundino Ginger Entered By: Secundino Ginger on 02/28/2018 14:36:15 Parkinson, Nhan ([id number]) -------------------------------------------------------------------------------- Multi Wound Chart Details Patient Name: [name] [name] Date of Service: 02/28/2018 2:30 PM Medical Record Number: [id number] Patient Account Number: [id number] Date of Birth/Sex: [birth year] (57 y.o. M) Treating RN: Cornell Barman Primary Care Khyron Garno: Soyla Dryer Other Clinician: Referring Cola Highfill: Soyla Dryer Treating Tawfiq Favila/Extender: Tito Dine in Treatment: 5 Vital Signs Height(in): 70 Pulse(bpm): 91 Weight(lbs): 139 Blood Pressure(mmHg): 102/69 Body Mass Index(BMI): 20 Temperature(F): 97.6 Respiratory Rate 18 (breaths/min): Photos: [N/A:N/A] Wound Location: Right Toe Great - Plantar N/A N/A Wounding Event: Gradually Appeared N/A N/A Primary Etiology: Diabetic Wound/Ulcer of the N/A N/A Lower Extremity Comorbid History: Cataracts, Hypertension, Type N/A  N/A II Diabetes, Rheumatoid Arthritis, Neuropathy Date Acquired: 12/24/2017 N/A N/A Weeks of Treatment: 5 N/A N/A Wound Status: Open N/A N/A Measurements L x W x D 0.7x0.7x0.3 N/A N/A (cm) Area (cm) : 0.385 N/A N/A Volume (cm) : 0.115 N/A N/A % Reduction in Area: 30.00% N/A N/A % Reduction in Volume: 65.20% N/A N/A Classification: Grade 2 N/A N/A Exudate Amount: Medium N/A N/A Exudate Type: Serous N/A N/A Exudate Color: amber N/A N/A Wound Margin: Distinct, outline attached N/A N/A Granulation Amount: Small (1-33%) N/A N/A Granulation Quality: Red, Pink N/A N/A Necrotic Amount: Large (67-100%) N/A N/A Necrotic Tissue: Eschar N/A N/A Exposed Structures: Fat Layer (Subcutaneous N/A N/A  Tissue) Exposed: Yes Fascia: No Tendon: No Lammert, Zaryan ([id number]) Muscle: No Joint: No Bone: No Epithelialization: None N/A N/A Debridement: Debridement - Excisional N/A N/A Pre-procedure 14:45 N/A N/A Verification/Time Out Taken: Pain Control: Other N/A N/A Tissue Debrided: Subcutaneous, Slough N/A N/A Level: Skin/Subcutaneous Tissue N/A N/A Debridement Area (sq cm): 0.49 N/A N/A Instrument: Curette N/A N/A Bleeding: Minimum N/A N/A Hemostasis Achieved: Pressure N/A N/A Debridement Treatment Procedure was tolerated well N/A N/A Response: Post Debridement 0.7x0.9x0.5 N/A N/A Measurements L x W x D (cm) Post Debridement Volume: 0.247 N/A N/A (cm) Periwound Skin Texture: Callus: Yes N/A N/A Excoriation: No Induration: No Crepitus: No Rash: No Scarring: No Periwound Skin Moisture: Maceration: No N/A N/A Dry/Scaly: No Periwound Skin Color: Atrophie Blanche: No N/A N/A Cyanosis: No Ecchymosis: No Erythema: No Hemosiderin Staining: No Mottled: No Pallor: No Rubor: No Temperature: No Abnormality N/A N/A Tenderness on Palpation: Yes N/A N/A Wound Preparation: Ulcer Cleansing: N/A N/A Rinsed/Irrigated with Saline Topical Anesthetic Applied: Other: lidocaine  4% Procedures Performed: Debridement N/A N/A Treatment Notes Wound #1 (Right, Plantar Toe Great) 1. Cleansed with: Clean wound with Normal Saline 2. Anesthetic Topical Lidocaine 4% cream to wound bed prior to debridement 4. Dressing Applied: Iodoflex 5. Secondary Dressing Applied Dry Gauze Kerlix/Conform Marlatt, Osmani ([id number]) 7. Secured with Tape Notes darco front Dietitian) Signed: 02/28/2018 4:51:54 PM By: Linton Ham MD Entered By: Linton Ham on 02/28/2018 16:32:22 ANZEL, KEARSE ([id number]) -------------------------------------------------------------------------------- Multi-Disciplinary Care Plan Details Patient Name: [name] [name] Date of Service: 02/28/2018 2:30 PM Medical Record Number: [id number] Patient Account Number: [id number] Date of Birth/Sex: 1960-07-14 (57 y.o. M) Treating RN: Cornell Barman Primary Care Demetrios Byron: Soyla Dryer Other Clinician: Referring Kanyon Bunn: Soyla Dryer Treating Layan Zalenski/Extender: Tito Dine in Treatment: 5 Active Inactive ` Necrotic Tissue Nursing Diagnoses: Knowledge deficit related to management of necrotic/devitalized tissue Goals: Necrotic/devitalized tissue will be minimized in the wound bed Date Initiated: 01/24/2018 Target Resolution Date: 02/09/2018 Goal Status: Active Interventions: Assess patient pain level pre-, during and post procedure and prior to discharge Treatment Activities: Excisional debridement : 01/24/2018 Notes: ` Orientation to the Wound Care Program Nursing Diagnoses: Knowledge deficit related to the wound healing center program Goals: Patient/caregiver will verbalize understanding of the [facility] Date Initiated: 01/24/2018 Target Resolution Date: 02/09/2018 Goal Status: Active Interventions: Provide education on orientation to the wound center Notes: ` Pressure Nursing Diagnoses: Knowledge deficit related to management of  pressures ulcers Goals: Patient will remain free from development of additional pressure ulcers Date Initiated: 01/24/2018 Target Resolution Date: 02/09/2018 SYMIR, MAH ([id number]) Goal Status: Active Interventions: Assess potential for pressure ulcer upon admission and as needed Notes: ` Wound/Skin Impairment Nursing Diagnoses: Knowledge deficit related to ulceration/compromised skin integrity Goals: Ulcer/skin breakdown will have a volume reduction of 30% by week 4 Date Initiated: 01/24/2018 Target Resolution Date: 02/24/2018 Goal Status: Active Interventions: Assess ulceration(s) every visit Treatment Activities: Topical wound management initiated : 01/24/2018 Notes: Electronic Signature(s) Signed: 03/01/2018 5:07:26 PM By: Gretta Cool, BSN, RN, CWS, Kim RN, BSN Entered By: Gretta Cool, BSN, RN, CWS, Kim on 02/28/2018 14:45:48 [place], Nell ([id number]) -------------------------------------------------------------------------------- Pain Assessment Details Patient Name: [name] [name] Date of Service: 02/28/2018 2:30 PM Medical Record Number: [id number] Patient Account Number: [id number] Date of Birth/Sex: September 16, 1960 (56 y.o. M) Treating RN: Cornell Barman Primary Care Taylr Meuth: Soyla Dryer Other Clinician: Referring Yatziri Wainwright: Soyla Dryer Treating Mac Dowdell/Extender: Tito Dine in Treatment: 5 Active Problems Location of Pain Severity and Description of Pain Patient Has Paino No Site  Locations Pain Management and Medication Current Pain Management: Electronic Signature(s) Signed: 02/28/2018 3:44:28 PM By: Lorine Bears RCP, RRT, CHT Signed: 03/01/2018 5:07:26 PM By: Gretta Cool, BSN, RN, CWS, Kim RN, BSN Entered By: Lorine Bears on 02/28/2018 14:33:05 THINH, CUCCARO ([id number]) -------------------------------------------------------------------------------- Patient/Caregiver Education Details Patient Name: [name] [name] Date of Service:  02/28/2018 2:30 PM Medical Record Number: [id number] Patient Account Number: [id number] Date of Birth/Gender: 1961/01/04 (56 y.o. M) Treating RN: Montey Hora Primary Care Physician: Soyla Dryer Other Clinician: Referring Physician: Soyla Dryer Treating Physician/Extender: Tito Dine in Treatment: 5 Education Assessment Education Provided To: Patient Education Topics Provided Wound/Skin Impairment: Handouts: Other: wound care as ordered Methods: Demonstration, Explain/Verbal Responses: State content correctly Electronic Signature(s) Signed: 02/28/2018 4:04:54 PM By: Montey Hora Entered By: Montey Hora on 02/28/2018 15:32:21 Welcher, Stephane ([id number]) -------------------------------------------------------------------------------- Wound Assessment Details Patient Name: [name] [name] Date of Service: 02/28/2018 2:30 PM Medical Record Number: [id number] Patient Account Number: [id number] Date of Birth/Sex: 1960-12-09 (56 y.o. M) Treating RN: Secundino Ginger Primary Care Bianna Haran: Soyla Dryer Other Clinician: Referring Harvest Stanco: Soyla Dryer Treating Sutter Ahlgren/Extender: Tito Dine in Treatment: 5 Wound Status Wound Number: 1 Primary Diabetic Wound/Ulcer of the Lower Extremity Etiology: Wound Location: Right Toe Great - Plantar Wound Open Wounding Event: Gradually Appeared Status: Date Acquired: 12/24/2017 Comorbid Cataracts, Hypertension, Type II Diabetes, Weeks Of Treatment: 5 History: Rheumatoid Arthritis, Neuropathy Clustered Wound: No Photos Photo Uploaded By: Secundino Ginger on 02/28/2018 14:51:07 Wound Measurements Length: (cm) 0.7 Width: (cm) 0.7 Depth: (cm) 0.3 Area: (cm) 0.385 Volume: (cm) 0.115 % Reduction in Area: 30% % Reduction in Volume: 65.2% Epithelialization: None Tunneling: No Undermining: No Wound Description Classification: Grade 2 Foul Odor Wound Margin: Distinct, outline attached Slough/Fib Exudate  Amount: Medium Exudate Type: Serous Exudate Color: amber After Cleansing: No rino Yes Wound Bed Granulation Amount: Small (1-33%) Exposed Structure Granulation Quality: Red, Pink Fascia Exposed: No Necrotic Amount: Large (67-100%) Fat Layer (Subcutaneous Tissue) Exposed: Yes Necrotic Quality: Eschar Tendon Exposed: No Muscle Exposed: No Joint Exposed: No Bone Exposed: No Periwound Skin Texture Savant, Zanden ([id number]) Texture Color No Abnormalities Noted: No No Abnormalities Noted: No Callus: Yes Atrophie Blanche: No Crepitus: No Cyanosis: No Excoriation: No Ecchymosis: No Induration: No Erythema: No Rash: No Hemosiderin Staining: No Scarring: No Mottled: No Pallor: No Moisture Rubor: No No Abnormalities Noted: No Dry / Scaly: No Temperature / Pain Maceration: No Temperature: No Abnormality Tenderness on Palpation: Yes Wound Preparation Ulcer Cleansing: Rinsed/Irrigated with Saline Topical Anesthetic Applied: Other: lidocaine 4%, Treatment Notes Wound #1 (Right, Plantar Toe Great) 1. Cleansed with: Clean wound with Normal Saline 2. Anesthetic Topical Lidocaine 4% cream to wound bed prior to debridement 4. Dressing Applied: Iodoflex 5. Secondary Dressing Applied Dry Gauze Kerlix/Conform 7. Secured with Tape Notes darco front Dietitian) Signed: 02/28/2018 3:42:50 PM By: Secundino Ginger Entered By: Secundino Ginger on 02/28/2018 14:41:30 [place], [place] ([id number]) -------------------------------------------------------------------------------- Vitals Details Patient Name: [name] [name] Date of Service: 02/28/2018 2:30 PM Medical Record Number: [id number] Patient Account Number: [id number] Date of Birth/Sex: 01-20-1961 (57 y.o. M) Treating RN: Cornell Barman Primary Care Rakhi Romagnoli: Soyla Dryer Other Clinician: Referring Pressley Tadesse: Soyla Dryer Treating Sanika Brosious/Extender: Tito Dine in Treatment: 5 Vital Signs Time Taken:  14:32 Temperature (F): 97.6 Height (in): 70 Pulse (bpm): 91 Weight (lbs): 139 Respiratory Rate (breaths/min): 18 Body Mass Index (BMI): 19.9 Blood Pressure (mmHg): 102/69 Reference Range: 80 - 120 mg / dl Electronic Signature(s) Signed: 02/28/2018 3:44:28 PM By: Lorine Bears RCP, RRT,  CHT Entered By: Lorine Bears on 02/28/2018 14:34:54

## 2018-03-03 NOTE — Progress Notes (Signed)
KEAVON, SENSING ([id number]) Visit Report for 02/28/2018 Debridement Details Patient Name: [name] [name], [name] [name] Date of Service: 02/28/2018 2:30 PM Medical Record Number: [id number] Patient Account Number: [id number] Date of Birth/Sex: 03-Jul-1960 (56 y.o. M) Treating RN: Cornell Barman Primary Care Provider: Soyla Dryer Other Clinician: Referring Provider: Soyla Dryer Treating Provider/Extender: Tito Dine in Treatment: 5 Debridement Performed for Wound #1 Right,Plantar Toe Great Assessment: Performed By: Physician Ricard Dillon, MD Debridement Type: Debridement Severity of Tissue Pre Fat layer exposed Debridement: Level of Consciousness (Pre- Awake and Alert procedure): Pre-procedure Verification/Time Yes - 14:45 Out Taken: Start Time: 14:45 Pain Control: Other : lidocaine 4% Total Area Debrided (L x W): 0.7 (cm) x 0.7 (cm) = 0.49 (cm) Tissue and other material Viable, Non-Viable, Slough, Subcutaneous, Skin: Dermis , Slough debrided: Level: Skin/Subcutaneous Tissue Debridement Description: Excisional Instrument: Curette Bleeding: Minimum Hemostasis Achieved: Pressure End Time: 14:47 Response to Treatment: Procedure was tolerated well Level of Consciousness Awake and Alert (Post-procedure): Post Debridement Measurements of Total Wound Length: (cm) 0.7 Width: (cm) 0.9 Depth: (cm) 0.5 Volume: (cm) 0.247 Character of Wound/Ulcer Post Debridement: Stable Severity of Tissue Post Debridement: Fat layer exposed Post Procedure Diagnosis Same as Pre-procedure Electronic Signature(s) Signed: 02/28/2018 4:51:54 PM By: Linton Ham MD Signed: 03/01/2018 5:07:26 PM By: Gretta Cool, BSN, RN, CWS, Kim RN, BSN Entered By: Linton Ham on 02/28/2018 16:32:35 Collyer, Albie ([id number]) -------------------------------------------------------------------------------- HPI Details Patient Name: [name] [name] Date of Service: 02/28/2018 2:30 PM Medical Record  Number: [id number] Patient Account Number: [id number] Date of Birth/Sex: [birth year] (57 y.o. M) Treating RN: Cornell Barman Primary Care Provider: Soyla Dryer Other Clinician: Referring Provider: Soyla Dryer Treating Provider/Extender: Tito Dine in Treatment: 5 History of Present Illness HPI Description: 01/24/18-He is seen in initial evaluation for right great toe ulcer. He was sent to the emergency room on 718 from the nurse clinic office for new-onset diabetes with an A1c of 12.9. He was discharged from the emergency room on metformin, had a right foot x-ray that was negative for osteomyelitis and had a follow-up with his primary care at the free clinic on 7/22. He has been treated twice with antibiotic therapy; bactrim (7/8) and doxycycline (7/18). He is neuropathic. He tolerated debridement; bone is palpable through a thin layer of tissue; there is, what appears to be, resolving erythema and edema; a culture was taken, antibiotics will be prescribed as needed based on sensitivities. 01/31/18; this is a patient with new onset type 2 diabetes who is on oral agents. Culture from last time showed a few Klebsiella and a few enterococcus is being appropriately treated with a combination of Augmentin and ciprofloxacin for 10 days which I think he just started yesterday. He has to get his medications through the free clinic in [place]. He has no insurance. Been using Prisma to the wound. Previous x-ray last month was negative for osteomyelitis.his ABIs look reasonable. He has an offloading sandal 02/07/18; he completes his antibiotics which are Augmentin and ciprofloxacin on Saturday. Using silver collagen. Once again he has callus around the wound and not a very viable looking wound surface. We have been giving him an offloading sandal but I think he is going to require more offloading than that. Previous x-ray was negative. ABIs in our clinic quite reasonable 02/14/18; he arrives  today with the wound dimensions slightly smaller. Still callus nonviable subcutaneous tissue around the wound circumference. He is still in a offloading shoe rather than the Darco. I transitioned him into the Darco today 02/21/18; slight  improvement in dimensions. Still aggressive debridements using Prisma. He is using a Darco forefoot offloading boot 02/28/18; not much improvement. Still requiring aggressive debridement using Prisma. I changed him to Iodoflex to help with the wound debridement. He is using a Darco forefoot offloading boot but tells me he had 1 fall this week. There is not an option for more aggressive offloading Electronic Signature(s) Signed: 02/28/2018 4:51:54 PM By: Linton Ham MD Entered By: Linton Ham on 02/28/2018 16:33:21 [place], [place] ([id number]) -------------------------------------------------------------------------------- Physical Exam Details Patient Name: [name] [name] Date of Service: 02/28/2018 2:30 PM Medical Record Number: [id number] Patient Account Number: [id number] Date of Birth/Sex: 13-Sep-1960 (57 y.o. M) Treating RN: Cornell Barman Primary Care Provider: Soyla Dryer Other Clinician: Referring Provider: Soyla Dryer Treating Provider/Extender: Tito Dine in Treatment: 5 Constitutional Sitting or standing Blood Pressure is within target range for patient.. Pulse regular and within target range for patient.Marland Kitchen Respirations regular, non-labored and within target range.. Temperature is normal and within the target range for the patient.Marland Kitchen appears in no distress. Notes exam; this still requires an aggressive debridement. Base of the wound seems to have more depth. In the ascending form to much time on his foot and in another reality I would like to put him in a total contact cast. There is no evidence of infection Electronic Signature(s) Signed: 02/28/2018 4:51:54 PM By: Linton Ham MD Entered By: Linton Ham on 02/28/2018  16:36:25 [place], Demontre ([id number]) -------------------------------------------------------------------------------- Physician Orders Details Patient Name: [name] [name] Date of Service: 02/28/2018 2:30 PM Medical Record Number: [id number] Patient Account Number: [id number] Date of Birth/Sex: 1960/07/28 (57 y.o. M) Treating RN: Cornell Barman Primary Care Provider: Soyla Dryer Other Clinician: Referring Provider: Soyla Dryer Treating Provider/Extender: Tito Dine in Treatment: 5 Verbal / Phone Orders: No Diagnosis Coding Wound Cleansing Wound #1 Right,Plantar Toe Great o Clean wound with Normal Saline. Anesthetic (add to Medication List) Wound #1 Right,Plantar Toe Great o Topical Lidocaine 4% cream applied to wound bed prior to debridement (In Clinic Only). Primary Wound Dressing Wound #1 Right,Plantar Toe Great o XtraSorb Secondary Dressing Wound #1 Right,Plantar Toe Great o Gauze and Kerlix/Conform Dressing Change Frequency Wound #1 Right,Plantar Toe Great o Change Dressing Monday, Wednesday, Friday Follow-up Appointments Wound #1 Right,Plantar Toe Great o Return Appointment in 1 week. Off-Loading Wound #1 Right,Plantar Toe Great o Open toe surgical shoe to: Banker Orders / Instructions Wound #1 Right,Plantar Toe Great o Increase protein intake. Radiology o X-ray, foot - Right foot 3 view (right great toe) Electronic Signature(s) Signed: 02/28/2018 4:51:54 PM By: Linton Ham MD Signed: 03/01/2018 5:07:26 PM By: Gretta Cool, BSN, RN, CWS, Kim RN, BSN Entered By: Gretta Cool, BSN, RN, CWS, Kim on 02/28/2018 14:52:15 [place] [employer] [place], [place] ([id number]) KHAIR, CHASTEEN ([id number]) -------------------------------------------------------------------------------- Problem List Details Patient Name: [name] [name] Date of Service: 02/28/2018 2:30 PM Medical Record Number: [id number] Patient Account Number: [id number] Date of Birth/Sex:  1961/02/26 (57 y.o. 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 M) Treating RN: Cornell Barman Primary Care Provider: Soyla Dryer Other Clinician: Referring Provider: Soyla Dryer Treating Provider/Extender: Tito Dine in Treatment: 5 Subjective History of Present Illness (HPI) 01/24/18-He is seen in initial evaluation for right great toe ulcer. He was sent to the emergency room on 718 from the nurse clinic office for new-onset diabetes with an A1c of 12.9. He was discharged from the emergency room on metformin, had a right foot x-ray that was negative for osteomyelitis and had a follow-up with his primary care at the free clinic on 7/22. He has been treated twice with antibiotic therapy; bactrim (7/8) and doxycycline (7/18). He is neuropathic. He tolerated debridement; bone is palpable through a thin layer of tissue; there is, what appears to be, resolving erythema and edema; a culture was taken, antibiotics will be  prescribed as needed based on sensitivities. 01/31/18; this is a patient with new onset type 2 diabetes who is on oral agents. Culture from last time showed a few Klebsiella and a few enterococcus is being appropriately treated with a combination of Augmentin and ciprofloxacin for 10 days which I think he just started yesterday. He has to get his medications through the free clinic in [place]. He has no insurance. Been using Prisma to the wound. Previous x-ray last month was negative for osteomyelitis.his ABIs look reasonable. He has an offloading sandal 02/07/18; he completes his antibiotics which are Augmentin and ciprofloxacin on Saturday. Using silver collagen. Once again he has callus around the wound and not a very viable looking wound surface. We have been giving him an offloading sandal but I think he is going to require more offloading than that. Previous x-ray was negative. ABIs in our clinic quite reasonable 02/14/18; he arrives today with the wound dimensions slightly smaller. Still callus nonviable subcutaneous tissue around the wound circumference. He is still in a offloading shoe rather than the Darco. I transitioned him into the Darco today 02/21/18; slight improvement in dimensions. Still aggressive debridements using Prisma. He is using a Darco forefoot offloading boot 02/28/18; not much improvement. Still requiring aggressive debridement using Prisma. I changed him to Iodoflex to help with the wound debridement. He is using a Darco forefoot offloading boot but tells me he had 1 fall this week. There is not an option for more aggressive offloading Objective Constitutional Sitting or standing Blood Pressure is within target range for patient.. Pulse regular and within target range for patient.Marland Kitchen Respirations regular, non-labored and within target range.. Temperature is normal and within the target range for the patient.Marland Kitchen appears in no distress. Vitals Time Taken: 2:32 PM, Height: 70  in, Weight: 139 lbs, BMI: 19.9, Temperature: 97.6 F, Pulse: 91 bpm, Respiratory Rate: 18 breaths/min, Blood Pressure: 102/69 mmHg. General Notes: exam; this still requires an aggressive debridement. Base of the wound seems to have more depth. In the ascending form to much time on his foot and in another reality I would like to put him in a total contact cast. There is no evidence of infection Henery, Jabar ([id number]) Integumentary (Hair, Skin) Wound #1 status is Open. Original cause of wound was Gradually Appeared. The wound is located on the [employer]. The wound measures 0.7cm length x 0.7cm width x 0.3cm depth; 0.385cm^2 area and 0.115cm^3 volume. There is Fat Layer (Subcutaneous Tissue) Exposed exposed. There is no  tunneling or undermining noted. There is a medium amount of serous drainage noted. The wound margin is distinct with the outline attached to the wound base. There is small (1-33%) red, pink granulation within the wound bed. There is a large (67-100%) amount of necrotic tissue within the wound bed including Eschar. The periwound skin appearance exhibited: Callus. The periwound skin appearance did not exhibit: Crepitus, Excoriation, Induration, Rash, Scarring, Dry/Scaly, Maceration, Atrophie Blanche, Cyanosis, Ecchymosis, Hemosiderin Staining, Mottled, Pallor, Rubor, Erythema. Periwound temperature was noted as No Abnormality. The periwound has tenderness on palpation. Assessment Active Problems ICD-10 Type 2 diabetes mellitus with foot ulcer Non-pressure chronic ulcer of other part of right foot with necrosis of muscle Type 2 diabetes mellitus with diabetic neuropathy, unspecified Procedures Wound #1 Pre-procedure diagnosis of Wound #1 is a Diabetic Wound/Ulcer of the Lower Extremity located on the Right,Plantar Toe Great .Severity of Tissue Pre Debridement is: Fat layer exposed. There was a Excisional Skin/Subcutaneous Tissue Debridement with a total area of  0.49 sq cm performed by Ricard Dillon, MD. With the following instrument(s): Curette to remove Viable and Non-Viable tissue/material. Material removed includes Subcutaneous Tissue, Slough, and Skin: Dermis after achieving pain control using Other (lidocaine 4%). No specimens were taken. A time out was conducted at 14:45, prior to the start of the procedure. A Minimum amount of bleeding was controlled with Pressure. The procedure was tolerated well. Post Debridement Measurements: 0.7cm length x 0.9cm width x 0.5cm depth; 0.247cm^3 volume. Character of Wound/Ulcer Post Debridement is stable. Severity of Tissue Post Debridement is: Fat layer exposed. Post procedure Diagnosis Wound #1: Same as Pre-Procedure Plan Wound Cleansing: Wound #1 Right,Plantar Toe Great: Clean wound with Normal Saline. Anesthetic (add to Medication List): Wound #1 Right,Plantar Toe Great: Topical Lidocaine 4% cream applied to wound bed prior to debridement (In Clinic Only). Primary Wound Dressing: Wound #1 Right,Plantar Toe GreatUel [place], [place] ([id number]) Secondary Dressing: Wound #1 Right,Plantar Toe Great: Gauze and Kerlix/Conform Dressing Change Frequency: Wound #1 Right,Plantar Toe Great: Change Dressing Monday, Wednesday, Friday Follow-up Appointments: Wound #1 Right,Plantar Toe Great: Return Appointment in 1 week. Off-Loading: Wound #1 Right,Plantar Toe Great: Open toe surgical shoe to: Banker Orders / Instructions: Wound #1 Right,Plantar Toe Great: Increase protein intake. Radiology ordered were: X-ray, foot - Right foot 3 view (right great toe) #1 change of primary dressing to Iodoflex #2 hopefully to clean up the surface of this wound. #3 of not really sure what we can do to further offload this. Continue the Darco forefoot offloading boot for now Electronic Signature(s) Signed: 02/28/2018 4:51:54 PM By: Linton Ham MD Entered By: Linton Ham on  02/28/2018 16:37:05 [place], Farrel ([id number]) -------------------------------------------------------------------------------- SuperBill Details Patient Name: [name] [name] Date of Service: 02/28/2018 Medical Record Number: [id number] Patient Account Number: [id number] Date of Birth/Sex: 10/19/1960 (56 y.o. M) Treating RN: Cornell Barman Primary Care Provider: Soyla Dryer Other Clinician: Referring Provider: Soyla Dryer Treating Provider/Extender: Tito Dine in Treatment: 5 Diagnosis Coding ICD-10 Codes Code Description E11.621 Type 2 diabetes mellitus with foot ulcer L97.513 Non-pressure chronic ulcer of other part of right foot with necrosis of muscle E11.40 Type 2 diabetes mellitus with diabetic neuropathy, unspecified Facility Procedures CPT4 Code Description: [id number] - DEB SUBQ TISSUE 20 SQ CM/< ICD-10 Diagnosis Description L97.513 Non-pressure chronic ulcer of other part of right foot with necr Modifier: osis of muscle Quantity: 1 Physician Procedures CPT4 Code Description: [id number] - WC PHYS SUBQ TISS 20 SQ CM ICD-10 Diagnosis Description L97.513  Non-pressure chronic ulcer of other part of right foot with necr Modifier: osis of muscle Quantity: 1 Electronic Signature(s) Signed: 02/28/2018 4:51:54 PM By: Linton Ham MD Entered By: Linton Ham on 02/28/2018 16:37:52

## 2018-03-07 ENCOUNTER — Encounter: Payer: Self-pay | Admitting: Internal Medicine

## 2018-03-09 NOTE — Progress Notes (Signed)
[name] [name], [name] [name] ([id number]) Visit Report for 03/07/2018 Debridement Details Patient Name: [name] [name], [name] [name] Date of Service: 03/07/2018 2:15 PM Medical Record Number: [id number] Patient Account Number: [id number] Date of Birth/Sex: 1961/06/05 (57 y.o. M) Treating RN: Cornell Barman Primary Care Provider: Soyla Dryer Other Clinician: Referring Provider: Soyla Dryer Treating Provider/Extender: Tito Dine in Treatment: 6 Debridement Performed for Wound #1 Right,Plantar Toe Great Assessment: Performed By: Physician Ricard Dillon, MD Debridement Type: Debridement Severity of Tissue Pre Fat layer exposed Debridement: Level of Consciousness (Pre- Awake and Alert procedure): Pre-procedure Verification/Time Yes - 15:06 Out Taken: Start Time: 15:06 Pain Control: Other : lidocaine 4% Total Area Debrided (L x W): 0.7 (cm) x 0.7 (cm) = 0.49 (cm) Tissue and other material Viable, Non-Viable, Callus, Subcutaneous debrided: Level: Skin/Subcutaneous Tissue Debridement Description: Excisional Instrument: Blade, Forceps Bleeding: Moderate Hemostasis Achieved: Pressure End Time: 15:11 Procedural Pain: 0 Post Procedural Pain: 0 Response to Treatment: Procedure was tolerated well Level of Consciousness Awake and Alert (Post-procedure): Post Debridement Measurements of Total Wound Length: (cm) 1.5 Width: (cm) 0.9 Depth: (cm) 0.6 Volume: (cm) 0.636 Character of Wound/Ulcer Post Debridement: Stable Severity of Tissue Post Debridement: Fat layer exposed Post Procedure Diagnosis Same as Pre-procedure Electronic Signature(s) Signed: 03/07/2018 5:31:05 PM By: Gretta Cool, BSN, RN, CWS, Kim RN, BSN Signed: 03/07/2018 6:15:06 PM By: Linton Ham MD Entered By: Linton Ham on 03/07/2018 15:19:40 [place], [name] [name] ([id number]) [place], [name] [name] ([id number]) -------------------------------------------------------------------------------- HPI Details Patient Name: [name] [name] Date of Service: 03/07/2018 2:15 PM Medical Record Number: [id number] Patient Account Number: [id number] Date of Birth/Sex: January 30, 1961 (57 y.o. M) Treating RN: Cornell Barman Primary Care Provider: Soyla Dryer Other Clinician: Referring Provider: Soyla Dryer Treating Provider/Extender: Tito Dine in Treatment: 6 History of Present Illness HPI Description: 01/24/18-He is seen in initial evaluation for right great toe ulcer. He was sent to the emergency room on 718 from the nurse clinic office for new-onset diabetes with an A1c of 12.9. He was discharged from the emergency room on metformin, had a right foot x-ray that was negative for osteomyelitis and had a follow-up with his primary care at the free clinic on 7/22. He has been treated twice with antibiotic therapy; bactrim (7/8) and doxycycline (7/18). He is neuropathic. He tolerated debridement; bone is palpable through a thin layer of tissue; there is, what appears to be, resolving erythema and edema; a culture was taken, antibiotics will be prescribed as needed based on sensitivities. 01/31/18; this is a patient with new onset type 2 diabetes who is on oral agents. Culture from last time showed a few Klebsiella and a few enterococcus is being appropriately treated with a combination of Augmentin and ciprofloxacin for 10 days which I think he just started yesterday. He has to get his medications through the free clinic in [place]. He has no insurance. Been using Prisma to the wound. Previous x-ray last month was negative for osteomyelitis.his ABIs look reasonable. He has an offloading sandal 02/07/18; he completes his antibiotics which are Augmentin and ciprofloxacin on Saturday. Using silver collagen. Once again he has callus around the wound and not a very viable looking wound surface. We have been giving him an offloading sandal but I think he is going to require more offloading than that. Previous x-ray was  negative. ABIs in our clinic quite reasonable 02/14/18; he arrives today with the wound dimensions slightly smaller. Still callus nonviable subcutaneous tissue around the wound circumference. He is still in a offloading shoe rather than the Darco. I transitioned  him into the Darco today 02/21/18; slight improvement in dimensions. Still aggressive debridements using Prisma. He is using a Darco forefoot offloading boot 02/28/18; not much improvement. Still requiring aggressive debridement using Prisma. I changed him to Iodoflex to help with the wound debridement. He is using a Darco forefoot offloading boot but tells me he had 1 fall this week. There is not an option for more aggressive offloading. x-ray I ordered last week was negative for osteomyelitis 03/07/18; not much improvement. He arrives with thick skin subcutaneous tissue overhanging the wound covered and callus. This requires debridement. Post debridement the wound surface looks pretty good. As usual have thoughts about of total contact cast but after talking to the patient and his mother and observing the patient leave the clinic there is absolutely no way he could tolerate a total contact cast. He is barely able to maintain balance and the Darco forefoot boot. I suspect this is secondary to diabetic neuropathy Electronic Signature(s) Signed: 03/07/2018 6:15:06 PM By: Linton Ham MD Entered By: Linton Ham on 03/07/2018 15:24:03 [name] [name], [name] [name] ([id number]) -------------------------------------------------------------------------------- Physical Exam Details Patient Name: [name] [name] Date of Service: 03/07/2018 2:15 PM Medical Record Number: [id number] Patient Account Number: [id number] Date of Birth/Sex: 05-07-1961 (57 y.o. M) Treating RN: Cornell Barman Primary Care Provider: Soyla Dryer Other Clinician: Referring Provider: Soyla Dryer Treating Provider/Extender: Tito Dine in Treatment:  6 Constitutional Sitting or standing Blood Pressure is within target range for patient.. Pulse regular and within target range for patient.Marland Kitchen Respirations regular, non-labored and within target range.. Temperature is normal and within the target range for the patient.Marland Kitchen appears in no distress. Notes wound exam; the patient comes in with the same small punched out area with overhanging skin and subcutaneous tissue as well as callus. Extensive debridement using pickups and a #15 scalpel to remove overhanging tissue and nonviable subcutaneous tissue over the wound surface. Paradoxically this cleans up quite nicely and the base of his wound actually looks quite healthy. The patient actually changes his own dressing Electronic Signature(s) Signed: 03/07/2018 6:15:06 PM By: Linton Ham MD Entered By: Linton Ham on 03/07/2018 15:22:35 [name] [name], [name] [name] ([id number]) -------------------------------------------------------------------------------- Physician Orders Details Patient Name: [name] [name] Date of Service: 03/07/2018 2:15 PM Medical Record Number: [id number] Patient Account Number: [id number] Date of Birth/Sex: November 22, 1960 (57 y.o. M) Treating RN: Cornell Barman Primary Care Provider: Soyla Dryer Other Clinician: Referring Provider: Soyla Dryer Treating Provider/Extender: Tito Dine in Treatment: 6 Verbal / Phone Orders: No Diagnosis Coding Wound Cleansing Wound #1 Right,Plantar Toe Great o Clean wound with Normal Saline. Anesthetic (add to Medication List) Wound #1 Right,Plantar Toe Great o Topical Lidocaine 4% cream applied to wound bed prior to debridement (In Clinic Only). Primary Wound Dressing Wound #1 Right,Plantar Toe Great o Silver Collagen Secondary Dressing Wound #1 Right,Plantar Toe Great o Gauze and Kerlix/Conform Dressing Change Frequency Wound #1 Right,Plantar Toe Great o Change Dressing Monday, Wednesday, Friday Follow-up  Appointments Wound #1 Right,Plantar Toe Great o Return Appointment in 1 week. Off-Loading Wound #1 Right,Plantar Toe Great o Open toe surgical shoe to: Banker Orders / Instructions Wound #1 Right,Plantar Toe Great o Increase protein intake. Electronic Signature(s) Signed: 03/07/2018 5:31:05 PM By: Gretta Cool, BSN, RN, CWS, Kim RN, BSN Signed: 03/07/2018 6:15:06 PM By: Linton Ham MD Entered By: Gretta Cool, BSN, RN, CWS, Kim on 03/07/2018 15:16:55 [place], [name] [name] ([id number]) -------------------------------------------------------------------------------- Problem List Details Patient Name: [name] [name] Date of Service: 03/07/2018 2:15 PM Medical Record Number: [id number] Patient Account Number: [id number]  Date of Birth/Sex: Sep 14, 1960 (57 y.o. 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 M) Treating RN: Cornell Barman Primary Care Provider: Soyla Dryer Other Clinician: Referring Provider: Soyla Dryer Treating  Provider/Extender: Tito Dine in Treatment: 6 Subjective History of Present Illness (HPI) 01/24/18-He is seen in initial evaluation for right great toe ulcer. He was sent to the emergency room on 718 from the nurse clinic office for new-onset diabetes with an A1c of 12.9. He was discharged from the emergency room on metformin, had a right foot x-ray that was negative for osteomyelitis and had a follow-up with his primary care at the free clinic on 7/22. He has been treated twice with antibiotic therapy; bactrim (7/8) and doxycycline (7/18). He is neuropathic. He tolerated debridement; bone is palpable through a thin layer of tissue; there is, what appears to be, resolving erythema and edema; a culture was taken, antibiotics will be prescribed as needed based on sensitivities. 01/31/18; this is a patient with new onset type 2 diabetes who is on oral agents. Culture from last time showed a few Klebsiella and a few enterococcus is being appropriately treated with a combination of Augmentin and ciprofloxacin for 10 days which I think he just started yesterday. He has to get his medications through the free clinic in [place]. He has no insurance. Been using Prisma to the wound. Previous x-ray last month was negative for osteomyelitis.his ABIs look reasonable. He has an offloading sandal 02/07/18; he completes his antibiotics which are Augmentin and ciprofloxacin on Saturday. Using silver collagen. Once again he has callus around the wound and not a very viable looking wound surface. We have been giving him an offloading sandal but I think he is going to require more offloading than that. Previous x-ray was negative. ABIs in our clinic quite reasonable 02/14/18; he arrives today with the wound dimensions slightly smaller. Still callus nonviable subcutaneous tissue around the wound circumference. He is still in a offloading shoe rather than the Darco. I transitioned him into the Darco  today 02/21/18; slight improvement in dimensions. Still aggressive debridements using Prisma. He is using a Darco forefoot offloading boot 02/28/18; not much improvement. Still requiring aggressive debridement using Prisma. I changed him to Iodoflex to help with the wound debridement. He is using a Darco forefoot offloading boot but tells me he had 1 fall this week. There is not an option for more aggressive offloading. x-ray I ordered last week was negative for osteomyelitis 03/07/18; not much improvement. He arrives with thick skin subcutaneous tissue overhanging the wound covered and callus. This requires debridement. Post debridement the wound surface looks pretty good. As usual have thoughts about of total contact cast but after talking to the patient and his mother and observing the patient leave the clinic there is absolutely no  way he could tolerate a total contact cast. He is barely able to maintain balance and the Darco forefoot boot. I suspect this is secondary to diabetic neuropathy Objective Constitutional Sitting or standing Blood Pressure is within target range for patient.. Pulse regular and within target range for patient.Marland Kitchen Respirations regular, non-labored and within target range.. Temperature is normal and within the target range for the patient.Marland Kitchen appears in no distress. Vitals Time Taken: 2:45 PM, Height: 70 in, Weight: 139 lbs, BMI: 19.9, Temperature: 98.3 F, Pulse: 91 bpm, Respiratory Rate: 18 breaths/min, Blood Pressure: 129/80 mmHg. [place], [name] [name] ([id number]) General Notes: wound exam; the patient comes in with the same small punched out area with overhanging skin and subcutaneous tissue as well as callus. Extensive debridement using pickups and a #15 scalpel to remove overhanging tissue and nonviable subcutaneous tissue over the wound surface. Paradoxically this cleans up quite nicely and the base of his wound actually looks quite healthy. The patient actually changes  his own dressing Integumentary (Hair, Skin) Wound #1 status is Open. Original cause of wound was Gradually Appeared. The wound is located on the [employer]. The wound measures 0.7cm length x 0.7cm width x 0.3cm depth; 0.385cm^2 area and 0.115cm^3 volume. There is Fat Layer (Subcutaneous Tissue) Exposed exposed. There is no tunneling noted, however, there is undermining starting at 12:00 and ending at 12:00 with a maximum distance of 0.3cm. There is a medium amount of serous drainage noted. The wound margin is distinct with the outline attached to the wound base. There is medium (34-66%) red, pink granulation within the wound bed. There is a medium (34-66%) amount of necrotic tissue within the wound bed including Eschar. The periwound skin appearance exhibited: Callus. The periwound skin appearance did not exhibit: Crepitus, Excoriation, Induration, Rash, Scarring, Dry/Scaly, Maceration, Atrophie Blanche, Cyanosis, Ecchymosis, Hemosiderin Staining, Mottled, Pallor, Rubor, Erythema. Periwound temperature was noted as No Abnormality. The periwound has tenderness on palpation. Assessment Active Problems ICD-10 Type 2 diabetes mellitus with foot ulcer Non-pressure chronic ulcer of other part of right foot with necrosis of muscle Type 2 diabetes mellitus with diabetic neuropathy, unspecified Procedures Wound #1 Pre-procedure diagnosis of Wound #1 is a Diabetic Wound/Ulcer of the Lower Extremity located on the Right,Plantar Toe Great .Severity of Tissue Pre Debridement is: Fat layer exposed. There was a Excisional Skin/Subcutaneous Tissue Debridement with a total area of 0.49 sq cm performed by Ricard Dillon, MD. With the following instrument(s): Blade, and Forceps to remove Viable and Non-Viable tissue/material. Material removed includes Callus and Subcutaneous Tissue and after achieving pain control using Other (lidocaine 4%). No specimens were taken. A time out was conducted  at 15:06, prior to the start of the procedure. A Moderate amount of bleeding was controlled with Pressure. The procedure was tolerated well with a pain level of 0 throughout and a pain level of 0 following the procedure. Post Debridement Measurements: 1.5cm length x 0.9cm width x 0.6cm depth; 0.636cm^3 volume. Character of Wound/Ulcer Post Debridement is stable. Severity of Tissue Post Debridement is: Fat layer exposed. Post procedure Diagnosis Wound #1: Same as Pre-Procedure Plan Wound Cleansing: Wound #1 Right,Plantar Toe GreatKrishang [name], [name] [name] ([id number]) Clean wound with Normal Saline. Anesthetic (add to Medication List): Wound #1 Right,Plantar Toe Great: Topical Lidocaine 4% cream applied to wound bed prior to debridement (In Clinic Only). Primary Wound Dressing: Wound #1 Right,Plantar Toe Great: Silver Collagen Secondary Dressing: Wound #1 Right,Plantar Toe Great: Gauze and Kerlix/Conform Dressing Change Frequency: Wound #1 Right,Plantar Toe Great: Change Dressing  Monday, Wednesday, Friday Follow-up Appointments: Wound #1 Right,Plantar Toe Great: Return Appointment in 1 week. Off-Loading: Wound #1 Right,Plantar Toe Great: Open toe surgical shoe to: Banker Orders / Instructions: Wound #1 Right,Plantar Toe Great: Increase protein intake. #1 I'm going to use silver collagen again. #2 counseled him about walking in the darco forefoot off loader with an attempt to keep the weight back on the heel. #3 I watched him walk out of the clinic there is absolutely no way he could do a total contact cast his gait is simply too unsteady. He reminds me he had a fall in the kitchen 2 weeks Electronic Signature(s) Signed: 03/07/2018 3:24:25 PM By: Linton Ham MD Entered By: Linton Ham on 03/07/2018 15:24:25 [place], [name] [name] ([id number]) -------------------------------------------------------------------------------- SuperBill Details Patient Name: [name] [name] Date of Service: 03/07/2018 Medical Record Number: [id number] Patient Account Number: [id number] Date of Birth/Sex: 11-25-1960 (57 y.o. M) Treating RN: Cornell Barman Primary Care Provider: Soyla Dryer Other Clinician: Referring Provider: Soyla Dryer Treating Provider/Extender: Tito Dine in Treatment: 6 Diagnosis Coding ICD-10 Codes Code Description E11.621 Type 2 diabetes mellitus with foot ulcer L97.513 Non-pressure chronic ulcer of other part of right foot with necrosis of muscle E11.40 Type 2 diabetes mellitus with diabetic neuropathy, unspecified Facility Procedures CPT4 Code Description: [id number] - DEB SUBQ TISSUE 20 SQ CM/< ICD-10 Diagnosis Description L97.513 Non-pressure chronic ulcer of other part of right foot with necr E11.621 Type 2 diabetes mellitus with foot ulcer Modifier: osis of muscle Quantity: 1 Physician Procedures CPT4 Code Description: [id number] - WC PHYS SUBQ TISS 20 SQ CM ICD-10 Diagnosis Description L97.513 Non-pressure chronic ulcer of other part of right foot with necr E11.621 Type 2 diabetes mellitus with foot ulcer Modifier: osis of muscle Quantity: 1 Electronic Signature(s) Signed: 03/07/2018 6:15:06 PM By: Linton Ham MD Entered By: Linton Ham on 03/07/2018 15:24:46

## 2018-03-09 NOTE — Progress Notes (Signed)
SHAMOND, SKELTON ([id number]) Visit Report for 03/07/2018 Arrival Information Details Patient Name: [name] [name], [name] [name] Date of Service: 03/07/2018 2:15 PM Medical Record Number: [id number] Patient Account Number: [id number] Date of Birth/Sex: May 17, 1961 (57 y.o. M) Treating RN: Roger Shelter Primary Care Clorene Nerio: Soyla Dryer Other Clinician: Referring Beatrix Breece: Soyla Dryer Treating Kearah Gayden/Extender: Tito Dine in Treatment: 6 Visit Information History Since Last Visit All ordered tests and consults were completed: No Patient Arrived: Ambulatory Added or deleted any medications: No Arrival Time: 14:44 Any new allergies or adverse reactions: No Accompanied By: mom Had a fall or experienced change in No Transfer Assistance: None activities of daily living that may affect Patient Identification Verified: Yes risk of falls: Secondary Verification Process Completed: Yes Signs or symptoms of abuse/neglect since last visito No Hospitalized since last visit: No Implantable device outside of the clinic excluding No cellular tissue based products placed in the center since last visit: Pain Present Now: No Electronic Signature(s) Signed: 03/07/2018 4:06:29 PM By: Roger Shelter Entered By: Roger Shelter on 03/07/2018 14:44:53 Dowdle, Theotis ([id number]) -------------------------------------------------------------------------------- Encounter Discharge Information Details Patient Name: [name] [name] Date of Service: 03/07/2018 2:15 PM Medical Record Number: [id number] Patient Account Number: [id number] Date of Birth/Sex: 01-Dec-1960 (57 y.o. M) Treating RN: Cornell Barman Primary Care Mitsy Owen: Soyla Dryer Other Clinician: Referring Kojo Liby: Soyla Dryer Treating Ting Cage/Extender: Tito Dine in Treatment: 6 Encounter Discharge Information Items Discharge Condition: Stable Ambulatory Status: Ambulatory Discharge Destination:  Home Transportation: Private Auto Accompanied By: Mom Schedule Follow-up Appointment: Yes Clinical Summary of Care: Post Procedure Vitals: Temperature (F): 98.3 Pulse (bpm): 91 Respiratory Rate (breaths/min): 16 Blood Pressure (mmHg): 129/80 Electronic Signature(s) Signed: 03/07/2018 5:31:05 PM By: Gretta Cool, BSN, RN, CWS, Kim RN, BSN Entered By: Gretta Cool, BSN, RN, CWS, Kim on 03/07/2018 15:19:57 Wade, Wilburn ([id number]) -------------------------------------------------------------------------------- Lower Extremity Assessment Details Patient Name: [name] [name] Date of Service: 03/07/2018 2:15 PM Medical Record Number: [id number] Patient Account Number: [id number] Date of Birth/Sex: 19-Oct-1960 (57 y.o. M) Treating RN: Roger Shelter Primary Care Dorethia Jeanmarie: Soyla Dryer Other Clinician: Referring Jonanthan Bolender: Soyla Dryer Treating Amerah Puleo/Extender: Tito Dine in Treatment: 6 Vascular Assessment Claudication: Claudication Assessment [Right:None] Pulses: Dorsalis Pedis Palpable: [Right:Yes] Posterior Tibial Extremity colors, hair growth, and conditions: Extremity Color: [Right:Normal] Hair Growth on Extremity: [Right:Yes] Temperature of Extremity: [Right:Warm] Capillary Refill: [Right:< 3 seconds] Toe Nail Assessment Left: Right: Thick: No Discolored: No Deformed: No Improper Length and Hygiene: No Electronic Signature(s) Signed: 03/07/2018 4:06:29 PM By: Roger Shelter Entered By: Roger Shelter on 03/07/2018 14:51:03 Mcveigh, Kenyon ([id number]) -------------------------------------------------------------------------------- Multi Wound Chart Details Patient Name: [name] [name] Date of Service: 03/07/2018 2:15 PM Medical Record Number: [id number] Patient Account Number: [id number] Date of Birth/Sex: 1960-06-30 (57 y.o. M) Treating RN: Cornell Barman Primary Care Nyshaun Standage: Soyla Dryer Other Clinician: Referring Malyia Moro: Soyla Dryer Treating  Kima Malenfant/Extender: Tito Dine in Treatment: 6 Vital Signs Height(in): 70 Pulse(bpm): 91 Weight(lbs): 139 Blood Pressure(mmHg): 129/80 Body Mass Index(BMI): 20 Temperature(F): 98.3 Respiratory Rate 18 (breaths/min): Photos: [1:No Photos] [N/A:N/A] Wound Location: [1:Right Toe Great - Plantar] [N/A:N/A] Wounding Event: [1:Gradually Appeared] [N/A:N/A] Primary Etiology: [1:Diabetic Wound/Ulcer of the Lower Extremity] [N/A:N/A] Comorbid History: [1:Cataracts, Hypertension, Type II Diabetes, Rheumatoid Arthritis, Neuropathy] [N/A:N/A] Date Acquired: [1:12/24/2017] [N/A:N/A] Weeks of Treatment: [1:6] [N/A:N/A] Wound Status: [1:Open] [N/A:N/A] Measurements L x W x D [1:0.7x0.7x0.3] [N/A:N/A] (cm) Area (cm) : [1:0.385] [N/A:N/A] Volume (cm) : [1:0.115] [N/A:N/A] % Reduction in Area: [1:30.00%] [N/A:N/A] % Reduction in Volume: [1:65.20%] [N/A:N/A] Starting Position 1 [1:12] (o'clock): Ending Position 1 [1:12] (o'clock): Maximum  Distance 1 (cm): [1:0.3] Undermining: [1:Yes] [N/A:N/A] Classification: [1:Grade 2] [N/A:N/A] Exudate Amount: [1:Medium] [N/A:N/A] Exudate Type: [1:Serous] [N/A:N/A] Exudate Color: [1:amber] [N/A:N/A] Wound Margin: [1:Distinct, outline attached] [N/A:N/A] Granulation Amount: [1:Medium (34-66%)] [N/A:N/A] Granulation Quality: [1:Red, Pink] [N/A:N/A] Necrotic Amount: [1:Medium (34-66%)] [N/A:N/A] Necrotic Tissue: [1:Eschar] [N/A:N/A] Exposed Structures: [1:Fat Layer (Subcutaneous Tissue) Exposed: Yes Fascia: No Tendon: No Muscle: No] [N/A:N/A] Joint: No Bone: No Epithelialization: None N/A N/A Debridement: Debridement - Excisional N/A N/A Pre-procedure 15:06 N/A N/A Verification/Time Out Taken: Pain Control: Other N/A N/A Tissue Debrided: Callus, Subcutaneous N/A N/A Level: Skin/Subcutaneous Tissue N/A N/A Debridement Area (sq cm): 0.49 N/A N/A Instrument: Blade, Forceps N/A N/A Bleeding: Moderate N/A N/A Hemostasis Achieved:  Pressure N/A N/A Procedural Pain: 0 N/A N/A Post Procedural Pain: 0 N/A N/A Debridement Treatment Procedure was tolerated well N/A N/A Response: Post Debridement 1.5x0.9x0.6 N/A N/A Measurements L x W x D (cm) Post Debridement Volume: 0.636 N/A N/A (cm) Periwound Skin Texture: Callus: Yes N/A N/A Excoriation: No Induration: No Crepitus: No Rash: No Scarring: No Periwound Skin Moisture: Maceration: No N/A N/A Dry/Scaly: No Periwound Skin Color: Atrophie Blanche: No N/A N/A Cyanosis: No Ecchymosis: No Erythema: No Hemosiderin Staining: No Mottled: No Pallor: No Rubor: No Temperature: No Abnormality N/A N/A Tenderness on Palpation: Yes N/A N/A Wound Preparation: Ulcer Cleansing: N/A N/A Rinsed/Irrigated with Saline Topical Anesthetic Applied: Other: lidocaine 4% Procedures Performed: Debridement N/A N/A Treatment Notes Wound #1 (Right, Plantar Toe Great) 4. Dressing Applied: Prisma Ag Notes foam, secured with conform and tape, darco front offloader [place], Sharod ([id number]) Electronic Signature(s) Signed: 03/07/2018 6:15:06 PM By: Linton Ham MD Entered By: Linton Ham on 03/07/2018 15:19:26 Rabadan, Jarone ([id number]) -------------------------------------------------------------------------------- [place] Details Patient Name: [name] [name] Date of Service: 03/07/2018 2:15 PM Medical Record Number: [id number] Patient Account Number: [id number] Date of Birth/Sex: 1960/10/03 (57 y.o. M) Treating RN: Cornell Barman Primary Care Alivya Wegman: Soyla Dryer Other Clinician: Referring Montrice Gracey: Soyla Dryer Treating Janissa Bertram/Extender: Tito Dine in Treatment: 6 Active Inactive ` Necrotic Tissue Nursing Diagnoses: Knowledge deficit related to management of necrotic/devitalized tissue Goals: Necrotic/devitalized tissue will be minimized in the wound bed Date Initiated: 01/24/2018 Target Resolution Date: 02/09/2018 Goal  Status: Active Interventions: Assess patient pain level pre-, during and post procedure and prior to discharge Treatment Activities: Excisional debridement : 01/24/2018 Notes: ` Orientation to the Wound Care Program Nursing Diagnoses: Knowledge deficit related to the wound healing center program Goals: Patient/caregiver will verbalize understanding of the [place] Date Initiated: 01/24/2018 Target Resolution Date: 02/09/2018 Goal Status: Active Interventions: Provide education on orientation to the wound center Notes: ` Pressure Nursing Diagnoses: Knowledge deficit related to management of pressures ulcers Goals: Patient will remain free from development of additional pressure ulcers Date Initiated: 01/24/2018 Target Resolution Date: 02/09/2018 KAYDON, HUSBY ([id number]) Goal Status: Active Interventions: Assess potential for pressure ulcer upon admission and as needed Notes: ` Wound/Skin Impairment Nursing Diagnoses: Knowledge deficit related to ulceration/compromised skin integrity Goals: Ulcer/skin breakdown will have a volume reduction of 30% by week 4 Date Initiated: 01/24/2018 Target Resolution Date: 02/24/2018 Goal Status: Active Interventions: Assess ulceration(s) every visit Treatment Activities: Topical wound management initiated : 01/24/2018 Notes: Electronic Signature(s) Signed: 03/07/2018 5:31:05 PM By: Gretta Cool, BSN, RN, CWS, Kim RN, BSN Entered By: Gretta Cool, BSN, RN, CWS, Kim on 03/07/2018 15:05:39 [place], [place] ([id number]) -------------------------------------------------------------------------------- Pain Assessment Details Patient Name: [name] [name] Date of Service: 03/07/2018 2:15 PM Medical Record Number: [id number] Patient Account Number: [id number] Date of Birth/Sex: 11-13-1960 (57 y.o. M)  Treating RN: Roger Shelter Primary Care Hulan Szumski: Soyla Dryer Other Clinician: Referring Salim Forero: Soyla Dryer Treating  Rai Sinagra/Extender: Tito Dine in Treatment: 6 Active Problems Location of Pain Severity and Description of Pain Patient Has Paino No Site Locations Pain Management and Medication Current Pain Management: Electronic Signature(s) Signed: 03/07/2018 4:06:29 PM By: Roger Shelter Entered By: Roger Shelter on 03/07/2018 14:44:58 Blankenship, Jyren ([id number]) -------------------------------------------------------------------------------- Patient/Caregiver Education Details Patient Name: [name] [name] Date of Service: 03/07/2018 2:15 PM Medical Record Number: [id number] Patient Account Number: [id number] Date of Birth/Gender: [birth year] (57 y.o. M) Treating RN: Cornell Barman Primary Care Physician: Soyla Dryer Other Clinician: Referring Physician: Soyla Dryer Treating Physician/Extender: Tito Dine in Treatment: 6 Education Assessment Education Provided To: Patient Education Topics Provided Offloading: Handouts: What is Offloadingo Methods: Demonstration, Explain/Verbal Responses: State content correctly Wound/Skin Impairment: Handouts: Caring for Your Ulcer Methods: Demonstration, Explain/Verbal Responses: State content correctly Electronic Signature(s) Signed: 03/07/2018 5:31:05 PM By: Gretta Cool, BSN, RN, CWS, Kim RN, BSN Entered By: Gretta Cool, BSN, RN, CWS, Kim on 03/07/2018 15:17:42 [place], [place] ([id number]) -------------------------------------------------------------------------------- Wound Assessment Details Patient Name: [name] [name] Date of Service: 03/07/2018 2:15 PM Medical Record Number: [id number] Patient Account Number: [id number] Date of Birth/Sex: Nov 27, 1960 (57 y.o. M) Treating RN: Roger Shelter Primary Care Janoah Menna: Soyla Dryer Other Clinician: Referring Cheron Coryell: Soyla Dryer Treating Yeshua Stryker/Extender: Tito Dine in Treatment: 6 Wound Status Wound Number: 1 Primary Diabetic Wound/Ulcer of the Lower  Extremity Etiology: Wound Location: Right Toe Great - Plantar Wound Open Wounding Event: Gradually Appeared Status: Date Acquired: 12/24/2017 Comorbid Cataracts, Hypertension, Type II Diabetes, Weeks Of Treatment: 6 History: Rheumatoid Arthritis, Neuropathy Clustered Wound: No Photos Wound Measurements Length: (cm) 0.7 % Reductio Width: (cm) 0.7 % Reductio Depth: (cm) 0.3 Epithelial Area: (cm) 0.385 Tunneling Volume: (cm) 0.115 Undermini Startin Ending Maximum n in Area: 30% n in Volume: 65.2% ization: None : No ng: Yes g Position (o'clock): 12 Position (o'clock): 12 Distance: (cm) 0.3 Wound Description Classification: Grade 2 Foul Odor Wound Margin: Distinct, outline attached Slough/Fib Exudate Amount: Medium Exudate Type: Serous Exudate Color: amber After Cleansing: No rino Yes Wound Bed Granulation Amount: Medium (34-66%) Exposed Structure Granulation Quality: Red, Pink Fascia Exposed: No Necrotic Amount: Medium (34-66%) Fat Layer (Subcutaneous Tissue) Exposed: Yes Necrotic Quality: Eschar Tendon Exposed: No Muscle Exposed: No Joint Exposed: No Olmeda, Majid ([id number]) Bone Exposed: No Periwound Skin Texture Texture Color No Abnormalities Noted: No No Abnormalities Noted: No Callus: Yes Atrophie Blanche: No Crepitus: No Cyanosis: No Excoriation: No Ecchymosis: No Induration: No Erythema: No Rash: No Hemosiderin Staining: No Scarring: No Mottled: No Pallor: No Moisture Rubor: No No Abnormalities Noted: No Dry / Scaly: No Temperature / Pain Maceration: No Temperature: No Abnormality Tenderness on Palpation: Yes Wound Preparation Ulcer Cleansing: Rinsed/Irrigated with Saline Topical Anesthetic Applied: Other: lidocaine 4%, Treatment Notes Wound #1 (Right, Plantar Toe Great) 4. Dressing Applied: Prisma Ag Notes foam, secured with conform and tape, darco front Dietitian) Signed: 03/07/2018 4:35:28 PM By:  Gretta Cool, BSN, RN, CWS, Kim RN, BSN Signed: 03/07/2018 4:35:39 PM By: Roger Shelter Previous Signature: 03/07/2018 4:06:29 PM Version By: Roger Shelter Entered By: Gretta Cool BSN, RN, CWS, Kim on 03/07/2018 16:35:27 [place], Jymir ([id number]) -------------------------------------------------------------------------------- Vitals Details Patient Name: [name] [name] Date of Service: 03/07/2018 2:15 PM Medical Record Number: [id number] Patient Account Number: [id number] Date of Birth/Sex: 07/17/1960 (57 y.o. M) Treating RN: Roger Shelter Primary Care Ashleah Valtierra: Soyla Dryer Other Clinician: Referring Kahlani Graber: Soyla Dryer Treating Mikyah Alamo/Extender: Tito Dine in Treatment:  6 Vital Signs Time Taken: 14:45 Temperature (F): 98.3 Height (in): 70 Pulse (bpm): 91 Weight (lbs): 139 Respiratory Rate (breaths/min): 18 Body Mass Index (BMI): 19.9 Blood Pressure (mmHg): 129/80 Reference Range: 80 - 120 mg / dl Electronic Signature(s) Signed: 03/07/2018 4:06:29 PM By: Roger Shelter Entered By: Roger Shelter on 03/07/2018 14:45:58

## 2018-03-14 ENCOUNTER — Encounter: Payer: Self-pay | Attending: Internal Medicine | Admitting: Internal Medicine

## 2018-03-14 DIAGNOSIS — E1140 Type 2 diabetes mellitus with diabetic neuropathy, unspecified: Secondary | ICD-10-CM | POA: Insufficient documentation

## 2018-03-14 DIAGNOSIS — I10 Essential (primary) hypertension: Secondary | ICD-10-CM | POA: Insufficient documentation

## 2018-03-14 DIAGNOSIS — Z7984 Long term (current) use of oral hypoglycemic drugs: Secondary | ICD-10-CM | POA: Insufficient documentation

## 2018-03-14 DIAGNOSIS — E11621 Type 2 diabetes mellitus with foot ulcer: Secondary | ICD-10-CM | POA: Insufficient documentation

## 2018-03-14 DIAGNOSIS — L97512 Non-pressure chronic ulcer of other part of right foot with fat layer exposed: Secondary | ICD-10-CM | POA: Insufficient documentation

## 2018-03-14 DIAGNOSIS — M069 Rheumatoid arthritis, unspecified: Secondary | ICD-10-CM | POA: Insufficient documentation

## 2018-03-17 NOTE — Progress Notes (Signed)
[name] [name] ([id number]) Visit Report for 03/14/2018 Debridement Details Patient Name: [name] [name] Date of Service: 03/14/2018 10:45 AM Medical Record Number: [id number] Patient Account Number: [id number] Date of Birth/Sex: 14-Mar-1961 (57 y.o. M) Treating RN: Cornell Barman Primary Care Provider: Soyla Dryer Other Clinician: Referring Provider: Soyla Dryer Treating Provider/Extender: Tito Dine in Treatment: 7 Debridement Performed for Wound #1 Right,Plantar Toe Great Assessment: Performed By: Physician Ricard Dillon, MD Debridement Type: Debridement Severity of Tissue Pre Fat layer exposed Debridement: Level of Consciousness (Pre- Awake and Alert procedure): Pre-procedure Verification/Time Yes - 11:48 Out Taken: Start Time: 11:48 Pain Control: Other : lidocaine 4% Total Area Debrided (L x W): 1 (cm) x 0.8 (cm) = 0.8 (cm) Tissue and other material Viable, Non-Viable, Callus, Slough, Subcutaneous, Slough debrided: Level: Skin/Subcutaneous Tissue Debridement Description: Excisional Instrument: Curette Bleeding: Minimum Hemostasis Achieved: Pressure End Time: 11:50 Response to Treatment: Procedure was tolerated well Level of Consciousness Awake and Alert (Post-procedure): Post Debridement Measurements of Total Wound Length: (cm) 1.2 Width: (cm) 0.8 Depth: (cm) 0.3 Volume: (cm) 0.226 Character of Wound/Ulcer Post Debridement: Stable Severity of Tissue Post Debridement: Fat layer exposed Post Procedure Diagnosis Same as Pre-procedure Electronic Signature(s) Signed: 03/15/2018 5:56:52 AM By: Linton Ham MD Signed: 03/15/2018 3:05:38 PM By: Gretta Cool, BSN, RN, CWS, Kim RN, BSN Entered By: Linton Ham on 03/14/2018 12:49:18 [place], [place] ([id number]) -------------------------------------------------------------------------------- HPI Details Patient Name: [name] [name] Date of Service: 03/14/2018 10:45 AM Medical Record Number:  [id number] Patient Account Number: [id number] Date of Birth/Sex: [birth year] (57 y.o. M) Treating RN: Cornell Barman Primary Care Provider: Soyla Dryer Other Clinician: Referring Provider: Soyla Dryer Treating Provider/Extender: Tito Dine in Treatment: 7 History of Present Illness HPI Description: 01/24/18-He is seen in initial evaluation for right great toe ulcer. He was sent to the emergency room on 718 from the nurse clinic office for new-onset diabetes with an A1c of 12.9. He was discharged from the emergency room on metformin, had a right foot x-ray that was negative for osteomyelitis and had a follow-up with his primary care at the free clinic on 7/22. He has been treated twice with antibiotic therapy; bactrim (7/8) and doxycycline (7/18). He is neuropathic. He tolerated debridement; bone is palpable through a thin layer of tissue; there is, what appears to be, resolving erythema and edema; a culture was taken, antibiotics will be prescribed as needed based on sensitivities. 01/31/18; this is a patient with new onset type 2 diabetes who is on oral agents. Culture from last time showed a few Klebsiella and a few enterococcus is being appropriately treated with a combination of Augmentin and ciprofloxacin for 10 days which I think he just started yesterday. He has to get his medications through the free clinic in [place]. He has no insurance. Been using Prisma to the wound. Previous x-ray last month was negative for osteomyelitis.his ABIs look reasonable. He has an offloading sandal 02/07/18; he completes his antibiotics which are Augmentin and ciprofloxacin on Saturday. Using silver collagen. Once again he has callus around the wound and not a very viable looking wound surface. We have been giving him an offloading sandal but I think he is going to require more offloading than that. Previous x-ray was negative. ABIs in our clinic quite reasonable 02/14/18; he arrives today  with the wound dimensions slightly smaller. Still callus nonviable subcutaneous tissue around the wound circumference. He is still in a offloading shoe rather than the Darco. I transitioned him into the Darco today 02/21/18; slight improvement in  dimensions. Still aggressive debridements using Prisma. He is using a Darco forefoot offloading boot 02/28/18; not much improvement. Still requiring aggressive debridement using Prisma. I changed him to Iodoflex to help with the wound debridement. He is using a Darco forefoot offloading boot but tells me he had 1 fall this week. There is not an option for more aggressive offloading. x-ray I ordered last week was negative for osteomyelitis 03/07/18; not much improvement. He arrives with thick skin subcutaneous tissue overhanging the wound covered and callus. This requires debridement. Post debridement the wound surface looks pretty good. As usual have thoughts about of total contact cast but after talking to the patient and his mother and observing the patient leave the clinic there is absolutely no way he could tolerate a total contact cast. He is barely able to maintain balance and the Darco forefoot boot. I suspect this is secondary to diabetic neuropathy 03/14/18; better looking wound today. Plantar aspect of the right great toe. Still requires debridement but less adherent callus nonviable tissue and less debris over the wound surface. He is tolerating the Darco forefoot boot marginally, not a candidate for a TCC. Electronic Signature(s) Signed: 03/15/2018 5:56:52 AM By: Linton Ham MD Entered By: Linton Ham on 03/14/2018 12:50:41 [place], [place] ([id number]) -------------------------------------------------------------------------------- Physical Exam Details Patient Name: [name] [name] Date of Service: 03/14/2018 10:45 AM Medical Record Number: [id number] Patient Account Number: [id number] Date of Birth/Sex: Jul 03, 1960 (57 y.o. M) Treating RN:  Cornell Barman Primary Care Provider: Soyla Dryer Other Clinician: Referring Provider: Soyla Dryer Treating Provider/Extender: Tito Dine in Treatment: 7 Constitutional Sitting or standing Blood Pressure is within target range for patient.. Pulse regular and within target range for patient.Marland Kitchen Respirations regular, non-labored and within target range.. Temperature is normal and within the target range for the patient.Marland Kitchen appears in no distress. Notes Would exam; in general there is less necrotic debris over the wound surface less callus and he requires a less aggressive debridement. Nevertheless using a #5 curet I still removed some callus and thick scab continues tissue from around the circumference of the wound and necrotic debris from the wound surface. He tolerates this well Electronic Signature(s) Signed: 03/15/2018 5:56:52 AM By: Linton Ham MD Entered By: Linton Ham on 03/14/2018 12:51:53 Faith, Corney ([id number]) -------------------------------------------------------------------------------- Physician Orders Details Patient Name: [name] [name] Date of Service: 03/14/2018 10:45 AM Medical Record Number: [id number] Patient Account Number: [id number] Date of Birth/Sex: 11-28-1960 (57 y.o. M) Treating RN: Cornell Barman Primary Care Provider: Soyla Dryer Other Clinician: Referring Provider: Soyla Dryer Treating Provider/Extender: Tito Dine in Treatment: 7 Verbal / Phone Orders: No Diagnosis Coding Wound Cleansing Wound #1 Right,Plantar Toe Great o Clean wound with Normal Saline. Anesthetic (add to Medication List) Wound #1 Right,Plantar Toe Great o Topical Lidocaine 4% cream applied to wound bed prior to debridement (In Clinic Only). Primary Wound Dressing Wound #1 Right,Plantar Toe Great o Silver Collagen Secondary Dressing Wound #1 Right,Plantar Toe Great o Gauze and Kerlix/Conform Dressing Change Frequency Wound #1  Right,Plantar Toe Great o Change Dressing Monday, Wednesday, Friday Follow-up Appointments Wound #1 Right,Plantar Toe Great o Return Appointment in 1 week. Off-Loading Wound #1 Right,Plantar Toe Great o Open toe surgical shoe to: Banker Orders / Instructions Wound #1 Right,Plantar Toe Great o Increase protein intake. Electronic Signature(s) Signed: 03/15/2018 5:56:52 AM By: Linton Ham MD Signed: 03/15/2018 3:05:38 PM By: Gretta Cool, BSN, RN, CWS, Kim RN, BSN Entered By: Gretta Cool, BSN, RN, CWS, Kim on 03/14/2018 11:50:07 [place], Srihan ([id number]) --------------------------------------------------------------------------------  Problem List Details Patient Name: MANU, RUBEY Date of Service: 03/14/2018 10:45 AM Medical Record Number: [id number] Patient Account Number: [id number] Date of Birth/Sex: [birth year] (57 y.o. 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 M) Treating RN: Cornell Barman Primary Care Provider:  Soyla Dryer Other Clinician: Referring Provider: Soyla Dryer Treating Provider/Extender: Tito Dine in Treatment: 7 Subjective History of Present Illness (HPI) 01/24/18-He is seen in initial evaluation for right great toe ulcer. He was sent to the emergency room on 718 from the nurse clinic office for new-onset diabetes with an A1c of 12.9. He was discharged from the emergency room on metformin, had a right foot x-ray that was negative for osteomyelitis and had a follow-up with his primary care at the free clinic on 7/22. He has been treated twice with antibiotic therapy; bactrim (7/8) and doxycycline (7/18). He is neuropathic. He tolerated debridement; bone is palpable through a thin layer of tissue; there is, what appears to be, resolving erythema and edema; a culture was taken, antibiotics will be prescribed as needed based on sensitivities. 01/31/18; this is a patient with new onset type 2 diabetes who is on oral agents. Culture from last time showed a few Klebsiella and a few enterococcus is being appropriately treated with a combination of Augmentin and ciprofloxacin for 10 days which I think he just started yesterday. He has to get his medications through the free clinic in [place]. He has no insurance. Been using Prisma to the wound. Previous x-ray last month was negative for osteomyelitis.his ABIs look reasonable. He has an offloading sandal 02/07/18; he completes his antibiotics which are Augmentin and ciprofloxacin on Saturday. Using silver collagen. Once again he has callus around the wound and not a very viable looking wound surface. We have been giving him an offloading sandal but I think he is going to require more offloading than that. Previous x-ray was negative. ABIs in our clinic quite reasonable 02/14/18; he arrives today with the wound dimensions slightly smaller. Still callus nonviable subcutaneous tissue around the wound circumference. He is still in a  offloading shoe rather than the Darco. I transitioned him into the Darco today 02/21/18; slight improvement in dimensions. Still aggressive debridements using Prisma. He is using a Darco forefoot offloading boot 02/28/18; not much improvement. Still requiring aggressive debridement using Prisma. I changed him to Iodoflex to help with the wound debridement. He is using a Darco forefoot offloading boot but tells me he had 1 fall this week. There is not an option for more aggressive offloading. x-ray I ordered last week was negative for osteomyelitis 03/07/18; not much improvement. He arrives with thick skin subcutaneous tissue overhanging the wound covered and callus. This requires debridement. Post debridement the wound surface looks pretty good. As usual have thoughts about of total contact  cast but after talking to the patient and his mother and observing the patient leave the clinic there is absolutely no way he could tolerate a total contact cast. He is barely able to maintain balance and the Darco forefoot boot. I suspect this is secondary to diabetic neuropathy 03/14/18; better looking wound today. Plantar aspect of the right great toe. Still requires debridement but less adherent callus nonviable tissue and less debris over the wound surface. He is tolerating the Darco forefoot boot marginally, not a candidate for a TCC. Objective Constitutional Sitting or standing Blood Pressure is within target range for patient.. Pulse regular and within target range for patient.Marland Kitchen Respirations regular, non-labored and within target range.. Temperature is normal and within the target range for the patient.Marland Kitchen appears in no distress. [place], [place] ([id number]) Vitals Time Taken: 11:19 AM, Height: 70 in, Weight: 139 lbs, BMI: 19.9, Temperature: 98.2 F, Pulse: 76 bpm, Respiratory Rate: 18 breaths/min, Blood Pressure: 114/66 mmHg. General Notes: Would exam; in general there is less necrotic debris over the  wound surface less callus and he requires a less aggressive debridement. Nevertheless using a #5 curet I still removed some callus and thick scab continues tissue from around the circumference of the wound and necrotic debris from the wound surface. He tolerates this well Integumentary (Hair, Skin) Wound #1 status is Open. Original cause of wound was Gradually Appeared. The wound is located on the [employer]. The wound measures 1cm length x 0.8cm width x 0.3cm depth; 0.628cm^2 area and 0.188cm^3 volume. There is Fat Layer (Subcutaneous Tissue) Exposed exposed. There is no tunneling or undermining noted. There is a medium amount of serous drainage noted. The wound margin is distinct with the outline attached to the wound base. There is medium (34-66%) red, pink granulation within the wound bed. There is a medium (34-66%) amount of necrotic tissue within the wound bed including Eschar. The periwound skin appearance exhibited: Callus. The periwound skin appearance did not exhibit: Crepitus, Excoriation, Induration, Rash, Scarring, Dry/Scaly, Maceration, Atrophie Blanche, Cyanosis, Ecchymosis, Hemosiderin Staining, Mottled, Pallor, Rubor, Erythema. Periwound temperature was noted as No Abnormality. The periwound has tenderness on palpation. Assessment Active Problems ICD-10 Type 2 diabetes mellitus with foot ulcer Non-pressure chronic ulcer of other part of right foot with necrosis of muscle Type 2 diabetes mellitus with diabetic neuropathy, unspecified Procedures Wound #1 Pre-procedure diagnosis of Wound #1 is a Diabetic Wound/Ulcer of the Lower Extremity located on the Right,Plantar Toe Great .Severity of Tissue Pre Debridement is: Fat layer exposed. There was a Excisional Skin/Subcutaneous Tissue Debridement with a total area of 0.8 sq cm performed by Ricard Dillon, MD. With the following instrument(s): Curette to remove Viable and Non-Viable tissue/material. Material removed  includes Callus, Subcutaneous Tissue, and Slough after achieving pain control using Other (lidocaine 4%). No specimens were taken. A time out was conducted at 11:48, prior to the start of the procedure. A Minimum amount of bleeding was controlled with Pressure. The procedure was tolerated well. Post Debridement Measurements: 1.2cm length x 0.8cm width x 0.3cm depth; 0.226cm^3 volume. Character of Wound/Ulcer Post Debridement is stable. Severity of Tissue Post Debridement is: Fat layer exposed. Post procedure Diagnosis Wound #1: Same as Pre-Procedure Plan Wound Cleansing: [place]au[place], Zale ([id number]) Wound #1 Right,Plantar Toe Great: Clean wound with Normal Saline. Anesthetic (add to Medication List): Wound #1 Right,Plantar Toe Great: Topical Lidocaine 4% cream applied to wound bed prior to debridement (In Clinic Only). Primary Wound Dressing: Wound #1 Right,Plantar Toe Great: Silver Collagen Secondary  Dressing: Wound #1 Right,Plantar Toe Great: Gauze and Kerlix/Conform Dressing Change Frequency: Wound #1 Right,Plantar Toe Great: Change Dressing Monday, Wednesday, Friday Follow-up Appointments: Wound #1 Right,Plantar Toe Great: Return Appointment in 1 week. Off-Loading: Wound #1 Right,Plantar Toe Great: Open toe surgical shoe to: Banker Orders / Instructions: Wound #1 Right,Plantar Toe Great: Increase protein intake. #1 continue with silver collagen. Kerlix: Form. His mother is having to buy the supplies Local stores. He does not have Musician) Signed: 03/15/2018 5:56:52 AM By: Linton Ham MD Entered By: Linton Ham on 03/14/2018 12:53:39 [place], [place] ([id number]) -------------------------------------------------------------------------------- Ideal Details Patient Name: [name] [name] Date of Service: 03/14/2018 Medical Record Number: [id number] Patient Account Number: [id number] Date of Birth/Sex: 1961-02-24 (57 y.o.  M) Treating RN: Cornell Barman Primary Care Provider: Soyla Dryer Other Clinician: Referring Provider: Soyla Dryer Treating Provider/Extender: Tito Dine in Treatment: 7 Diagnosis Coding ICD-10 Codes Code Description E11.621 Type 2 diabetes mellitus with foot ulcer L97.513 Non-pressure chronic ulcer of other part of right foot with necrosis of muscle E11.40 Type 2 diabetes mellitus with diabetic neuropathy, unspecified Facility Procedures CPT4 Code Description: [id number] 11042 - DEB SUBQ TISSUE 20 SQ CM/< ICD-10 Diagnosis Description L97.513 Non-pressure chronic ulcer of other part of right foot with necr Modifier: osis of muscle Quantity: 1 Physician Procedures CPT4 Code Description: [id number] 75300 - WC PHYS SUBQ TISS 20 SQ CM ICD-10 Diagnosis Description L97.513 Non-pressure chronic ulcer of other part of right foot with necr Modifier: osis of muscle Quantity: 1 Electronic Signature(s) Signed: 03/15/2018 5:56:52 AM By: Linton Ham MD Entered By: Linton Ham on 03/14/2018 12:54:00

## 2018-03-17 NOTE — Progress Notes (Signed)
ANZEL, KEARSE ([id number]) Visit Report for 03/14/2018 Arrival Information Details Patient Name: [name] [name], [name] [name] Date of Service: 03/14/2018 10:45 AM Medical Record Number: [id number] Patient Account Number: [id number] Date of Birth/Sex: January 06, 1961 (57 y.o. M) Treating RN: Cornell Barman Primary Care Adar Rase: Soyla Dryer Other Clinician: Referring Saheed Carrington: Soyla Dryer Treating Aneshia Jacquet/Extender: Tito Dine in Treatment: 7 Visit Information History Since Last Visit Added or deleted any medications: No Patient Arrived: Ambulatory Any new allergies or adverse reactions: No Arrival Time: 10:52 Had a fall or experienced change in No Accompanied By: mother activities of daily living that may affect Transfer Assistance: None risk of falls: Patient Identification Verified: Yes Signs or symptoms of abuse/neglect since last visito No Secondary Verification Process Completed: Yes Hospitalized since last visit: No Implantable device outside of the clinic excluding No cellular tissue based products placed in the center since last visit: Has Dressing in Place as Prescribed: Yes Pain Present Now: No Electronic Signature(s) Signed: 03/14/2018 1:31:10 PM By: Lorine Bears RCP, RRT, CHT Entered By: Lorine Bears on 03/14/2018 10:52:46 [place], Algis ([id number]) -------------------------------------------------------------------------------- Lower Extremity Assessment Details Patient Name: [name] [name] Date of Service: 03/14/2018 10:45 AM Medical Record Number: [id number] Patient Account Number: [id number] Date of Birth/Sex: Apr 16, 1961 (57 y.o. M) Treating RN: Cornell Barman Primary Care Riyana Biel: Soyla Dryer Other Clinician: Referring Kage Willmann: Soyla Dryer Treating Tehillah Cipriani/Extender: Tito Dine in Treatment: 7 Edema Assessment Assessed: [Left: No] [Right: No] Edema: [Left: N] [Right: o] Vascular  Assessment Claudication: Claudication Assessment [Left:None] Pulses: Dorsalis Pedis Palpable: [Left:Yes] Posterior Tibial Extremity colors, hair growth, and conditions: Extremity Color: [Left:Normal] Hair Growth on Extremity: [Left:Yes] Temperature of Extremity: [Left:Warm] Capillary Refill: [Left:< 3 seconds] Toe Nail Assessment Left: Right: Thick: No Discolored: No Deformed: No Electronic Signature(s) Signed: 03/14/2018 1:31:10 PM By: Lorine Bears RCP, RRT, CHT Signed: 03/15/2018 3:05:38 PM By: Gretta Cool, BSN, RN, CWS, Kim RN, BSN Entered By: Lorine Bears on 03/14/2018 11:20:45 [name] [name], [name] [name] ([id number]) -------------------------------------------------------------------------------- Multi Wound Chart Details Patient Name: [name] [name] Date of Service: 03/14/2018 10:45 AM Medical Record Number: [id number] Patient Account Number: [id number] Date of Birth/Sex: 11-03-1960 (57 y.o. M) Treating RN: Cornell Barman Primary Care Juanjesus Pepperman: Soyla Dryer Other Clinician: Referring Avenly Roberge: Soyla Dryer Treating Tranae Laramie/Extender: Tito Dine in Treatment: 7 Vital Signs Height(in): 70 Pulse(bpm): 13 Weight(lbs): 139 Blood Pressure(mmHg): 114/66 Body Mass Index(BMI): 20 Temperature(F): 98.2 Respiratory Rate 18 (breaths/min): Photos: [1:No Photos] [N/A:N/A] Wound Location: [1:Right Toe Great - Plantar] [N/A:N/A] Wounding Event: [1:Gradually Appeared] [N/A:N/A] Primary Etiology: [1:Diabetic Wound/Ulcer of the Lower Extremity] [N/A:N/A] Comorbid History: [1:Cataracts, Hypertension, Type II Diabetes, Rheumatoid Arthritis, Neuropathy] [N/A:N/A] Date Acquired: [1:12/24/2017] [N/A:N/A] Weeks of Treatment: [1:7] [N/A:N/A] Wound Status: [1:Open] [N/A:N/A] Measurements L x W x D [1:1x0.8x0.3] [N/A:N/A] (cm) Area (cm) : [1:0.628] [N/A:N/A] Volume (cm) : [1:0.188] [N/A:N/A] % Reduction in Area: [1:-14.20%] [N/A:N/A] % Reduction in  Volume: [1:43.00%] [N/A:N/A] Classification: [1:Grade 2] [N/A:N/A] Exudate Amount: [1:Medium] [N/A:N/A] Exudate Type: [1:Serous] [N/A:N/A] Exudate Color: [1:amber] [N/A:N/A] Wound Margin: [1:Distinct, outline attached] [N/A:N/A] Granulation Amount: [1:Medium (34-66%)] [N/A:N/A] Granulation Quality: [1:Red, Pink] [N/A:N/A] Necrotic Amount: [1:Medium (34-66%)] [N/A:N/A] Necrotic Tissue: [1:Eschar] [N/A:N/A] Exposed Structures: [1:Fat Layer (Subcutaneous Tissue) Exposed: Yes Fascia: No Tendon: No Muscle: No Joint: No Bone: No] [N/A:N/A] Epithelialization: [1:None] [N/A:N/A] Debridement: [1:Debridement - Excisional] [N/A:N/A] Pre-procedure [1:11:48] [N/A:N/A] Verification/Time Out Taken: [place], Leno ([id number]) Pain Control: Other N/A N/A Tissue Debrided: Callus, Subcutaneous, Slough N/A N/A Level: Skin/Subcutaneous Tissue N/A N/A Debridement Area (sq cm): 0.8 N/A N/A Instrument: Curette N/A N/A Bleeding: Minimum N/A  N/A Hemostasis Achieved: Pressure N/A N/A Debridement Treatment Procedure was tolerated well N/A N/A Response: Post Debridement 1.2x0.8x0.3 N/A N/A Measurements L x W x D (cm) Post Debridement Volume: 0.226 N/A N/A (cm) Periwound Skin Texture: Callus: Yes N/A N/A Excoriation: No Induration: No Crepitus: No Rash: No Scarring: No Periwound Skin Moisture: Maceration: No N/A N/A Dry/Scaly: No Periwound Skin Color: Atrophie Blanche: No N/A N/A Cyanosis: No Ecchymosis: No Erythema: No Hemosiderin Staining: No Mottled: No Pallor: No Rubor: No Temperature: No Abnormality N/A N/A Tenderness on Palpation: Yes N/A N/A Wound Preparation: Ulcer Cleansing: N/A N/A Rinsed/Irrigated with Saline Topical Anesthetic Applied: Other: lidocaine 4% Procedures Performed: Debridement N/A N/A Treatment Notes Electronic Signature(s) Signed: 03/15/2018 5:56:52 AM By: Linton Ham MD Entered By: Linton Ham on 03/14/2018 12:49:07 [name] [name], [name] [name]  ([id number]) -------------------------------------------------------------------------------- Multi-Disciplinary Care Plan Details Patient Name: [name] [name] Date of Service: 03/14/2018 10:45 AM Medical Record Number: [id number] Patient Account Number: [id number] Date of Birth/Sex: 07-Dec-1960 (57 y.o. M) Treating RN: Cornell Barman Primary Care Kaylub Detienne: Soyla Dryer Other Clinician: Referring Mikki Ziff: Soyla Dryer Treating Spencer Cardinal/Extender: Tito Dine in Treatment: 7 Active Inactive ` Necrotic Tissue Nursing Diagnoses: Knowledge deficit related to management of necrotic/devitalized tissue Goals: Necrotic/devitalized tissue will be minimized in the wound bed Date Initiated: 01/24/2018 Target Resolution Date: 02/09/2018 Goal Status: Active Interventions: Assess patient pain level pre-, during and post procedure and prior to discharge Treatment Activities: Excisional debridement : 01/24/2018 Notes: ` Orientation to the Wound Care Program Nursing Diagnoses: Knowledge deficit related to the wound healing center program Goals: Patient/caregiver will verbalize understanding of the [place] Date Initiated: 01/24/2018 Target Resolution Date: 02/09/2018 Goal Status: Active Interventions: Provide education on orientation to the wound center Notes: ` Pressure Nursing Diagnoses: Knowledge deficit related to management of pressures ulcers Goals: Patient will remain free from development of additional pressure ulcers Date Initiated: 01/24/2018 Target Resolution Date: 02/09/2018 [name] [name], [name] [name] ([id number]) Goal Status: Active Interventions: Assess potential for pressure ulcer upon admission and as needed Notes: ` Wound/Skin Impairment Nursing Diagnoses: Knowledge deficit related to ulceration/compromised skin integrity Goals: Ulcer/skin breakdown will have a volume reduction of 30% by week 4 Date Initiated: 01/24/2018 Target Resolution Date:  02/24/2018 Goal Status: Active Interventions: Assess ulceration(s) every visit Treatment Activities: Topical wound management initiated : 01/24/2018 Notes: Electronic Signature(s) Signed: 03/15/2018 3:05:38 PM By: Gretta Cool, BSN, RN, CWS, Kim RN, BSN Entered By: Gretta Cool, BSN, RN, CWS, Kim on 03/14/2018 11:47:45 [name] [name], [name] [name] ([id number]) -------------------------------------------------------------------------------- Pain Assessment Details Patient Name: [name] [name] Date of Service: 03/14/2018 10:45 AM Medical Record Number: [id number] Patient Account Number: [id number] Date of Birth/Sex: Sep 06, 1960 (57 y.o. M) Treating RN: Cornell Barman Primary Care Kiki Bivens: Soyla Dryer Other Clinician: Referring Serrena Linderman: Soyla Dryer Treating Malee Grays/Extender: Tito Dine in Treatment: 7 Active Problems Location of Pain Severity and Description of Pain Patient Has Paino No Site Locations Pain Management and Medication Current Pain Management: Electronic Signature(s) Signed: 03/14/2018 1:31:10 PM By: Lorine Bears RCP, RRT, CHT Signed: 03/15/2018 3:05:38 PM By: Gretta Cool, BSN, RN, CWS, Kim RN, BSN Entered By: Lorine Bears on 03/14/2018 10:52:53 [name] [name], [name] [name] ([id number]) -------------------------------------------------------------------------------- Patient/Caregiver Education Details Patient Name: [name] [name] Date of Service: 03/14/2018 10:45 AM Medical Record Number: [id number] Patient Account Number: [id number] Date of Birth/Gender: 1961-05-05 (57 y.o. M) Treating RN: Cornell Barman Primary Care Physician: Soyla Dryer Other Clinician: Referring Physician: Soyla Dryer Treating Physician/Extender: Tito Dine in Treatment: 7 Education Assessment Education Provided To: Patient Education Topics Provided Offloading: Handouts: What is Offloadingo  Methods: Demonstration, Explain/Verbal Responses: State content  correctly Wound/Skin Impairment: Handouts: Caring for Your Ulcer Methods: Demonstration, Explain/Verbal Responses: State content correctly Electronic Signature(s) Signed: 03/15/2018 3:05:38 PM By: Gretta Cool, BSN, RN, CWS, Kim RN, BSN Entered By: Gretta Cool, BSN, RN, CWS, Kim on 03/14/2018 11:50:29 [name] [name], [name] [name] ([id number]) -------------------------------------------------------------------------------- Wound Assessment Details Patient Name: [name] [name] Date of Service: 03/14/2018 10:45 AM Medical Record Number: [id number] Patient Account Number: [id number] Date of Birth/Sex: 26-Jun-1960 (57 y.o. M) Treating RN: Cornell Barman Primary Care Sulo Janczak: Soyla Dryer Other Clinician: Referring Rodricus Candelaria: Soyla Dryer Treating Marchell Froman/Extender: Tito Dine in Treatment: 7 Wound Status Wound Number: 1 Primary Diabetic Wound/Ulcer of the Lower Extremity Etiology: Wound Location: Right Toe Great - Plantar Wound Open Wounding Event: Gradually Appeared Status: Date Acquired: 12/24/2017 Comorbid Cataracts, Hypertension, Type II Diabetes, Weeks Of Treatment: 7 History: Rheumatoid Arthritis, Neuropathy Clustered Wound: No Photos Photo Uploaded By: Roger Shelter on 03/14/2018 13:48:54 Wound Measurements Length: (cm) 1 Width: (cm) 0.8 Depth: (cm) 0.3 Area: (cm) 0.628 Volume: (cm) 0.188 % Reduction in Area: -14.2% % Reduction in Volume: 43% Epithelialization: None Tunneling: No Undermining: No Wound Description Classification: Grade 2 Wound Margin: Distinct, outline attached Exudate Amount: Medium Exudate Type: Serous Exudate Color: amber Foul Odor After Cleansing: No Slough/Fibrino Yes Wound Bed Granulation Amount: Medium (34-66%) Exposed Structure Granulation Quality: Red, Pink Fascia Exposed: No Necrotic Amount: Medium (34-66%) Fat Layer (Subcutaneous Tissue) Exposed: Yes Necrotic Quality: Eschar Tendon Exposed: No Muscle Exposed: No Joint Exposed: No Bone  Exposed: No Periwound Skin Texture [name] [name], [name] [name] ([id number]) Texture Color No Abnormalities Noted: No No Abnormalities Noted: No Callus: Yes Atrophie Blanche: No Crepitus: No Cyanosis: No Excoriation: No Ecchymosis: No Induration: No Erythema: No Rash: No Hemosiderin Staining: No Scarring: No Mottled: No Pallor: No Moisture Rubor: No No Abnormalities Noted: No Dry / Scaly: No Temperature / Pain Maceration: No Temperature: No Abnormality Tenderness on Palpation: Yes Wound Preparation Ulcer Cleansing: Rinsed/Irrigated with Saline Topical Anesthetic Applied: Other: lidocaine 4%, Electronic Signature(s) Signed: 03/14/2018 1:31:10 PM By: Lorine Bears RCP, RRT, CHT Signed: 03/15/2018 3:05:38 PM By: Gretta Cool, BSN, RN, CWS, Kim RN, BSN Entered By: Lorine Bears on 03/14/2018 11:20:20 [name] [name], [name] [name] ([id number]) -------------------------------------------------------------------------------- Vitals Details Patient Name: [name] [name] Date of Service: 03/14/2018 10:45 AM Medical Record Number: [id number] Patient Account Number: [id number] Date of Birth/Sex: 07/10/1960 (57 y.o. M) Treating RN: Cornell Barman Primary Care Merrillyn Ackerley: Soyla Dryer Other Clinician: Referring Arelys Glassco: Soyla Dryer Treating Nadya Hopwood/Extender: Tito Dine in Treatment: 7 Vital Signs Time Taken: 11:19 Temperature (F): 98.2 Height (in): 70 Pulse (bpm): 76 Weight (lbs): 139 Respiratory Rate (breaths/min): 18 Body Mass Index (BMI): 19.9 Blood Pressure (mmHg): 114/66 Reference Range: 80 - 120 mg / dl Electronic Signature(s) Signed: 03/14/2018 1:31:10 PM By: Lorine Bears RCP, RRT, CHT Entered By: Lorine Bears on 03/14/2018 11:20:09

## 2018-03-21 ENCOUNTER — Encounter: Payer: Self-pay | Admitting: Internal Medicine

## 2018-03-23 ENCOUNTER — Other Ambulatory Visit (HOSPITAL_COMMUNITY)
Admission: RE | Admit: 2018-03-23 | Discharge: 2018-03-23 | Disposition: A | Discharge status: Home / Self Care | Payer: Self-pay | Source: Ambulatory Visit | Attending: Physician Assistant | Admitting: Physician Assistant

## 2018-03-23 DIAGNOSIS — E785 Hyperlipidemia, unspecified: Secondary | ICD-10-CM | POA: Insufficient documentation

## 2018-03-23 DIAGNOSIS — I10 Essential (primary) hypertension: Secondary | ICD-10-CM | POA: Insufficient documentation

## 2018-03-23 DIAGNOSIS — E1165 Type 2 diabetes mellitus with hyperglycemia: Secondary | ICD-10-CM | POA: Insufficient documentation

## 2018-03-23 LAB — COMPREHENSIVE METABOLIC PANEL WITH GFR
ALT: 12 U/L (ref 0–44)
AST: 12 U/L — ABNORMAL LOW (ref 15–41)
Albumin: 4.2 g/dL (ref 3.5–5.0)
Alkaline Phosphatase: 61 U/L (ref 38–126)
Anion gap: 10 (ref 5–15)
BUN: 17 mg/dL (ref 6–20)
CO2: 25 mmol/L (ref 22–32)
Calcium: 9.5 mg/dL (ref 8.9–10.3)
Chloride: 106 mmol/L (ref 98–111)
Creatinine, Ser: 0.6 mg/dL — ABNORMAL LOW (ref 0.61–1.24)
GFR calc Af Amer: >60 mL/min
GFR calc non Af Amer: >60 mL/min
Glucose, Bld: 178 mg/dL — ABNORMAL HIGH (ref 70–99)
Potassium: 4.2 mmol/L (ref 3.5–5.1)
Sodium: 141 mmol/L (ref 135–145)
Total Bilirubin: 1 mg/dL (ref 0.3–1.2)
Total Protein: 7.5 g/dL (ref 6.5–8.1)

## 2018-03-23 LAB — LIPID PANEL
CHOLESTEROL: 118 mg/dL (ref 0–200)
HDL: 42 mg/dL (ref >40)
LDL CALC: 56 mg/dL (ref 0–99)
Total CHOL/HDL Ratio: 2.8 RATIO
Triglycerides: 102 mg/dL (ref <150)
VLDL: 20 mg/dL (ref 0–40)

## 2018-03-23 LAB — HEMOGLOBIN A1C
HEMOGLOBIN A1C: 7.1 % — AB (ref 4.8–5.6)
Mean Plasma Glucose: 157.07 mg/dL

## 2018-03-24 NOTE — Progress Notes (Signed)
[name] [name], [name] [name] ([id number]) Visit Report for 03/21/2018 Arrival Information Details Patient Name: [name] [name], [name] [name] Date of Service: 03/21/2018 10:00 AM Medical Record Number: [id number] Patient Account Number: [id number] Date of Birth/Sex: 09/29/1960 (57 y.o. M) Treating RN: Cornell Barman Primary Care Annastyn Silvey: Soyla Dryer Other Clinician: Referring Dunbar Buras: Soyla Dryer Treating Taleen Prosser/Extender: Tito Dine in Treatment: 8 Visit Information History Since Last Visit Added or deleted any medications: No Patient Arrived: Ambulatory Any new allergies or adverse reactions: No Arrival Time: 09:59 Had a fall or experienced change in No Accompanied By: mother activities of daily living that may affect Transfer Assistance: None risk of falls: Patient Identification Verified: Yes Signs or symptoms of abuse/neglect since last visito No Secondary Verification Process Completed: Yes Hospitalized since last visit: No Implantable device outside of the clinic excluding No cellular tissue based products placed in the center since last visit: Has Dressing in Place as Prescribed: Yes Pain Present Now: No Electronic Signature(s) Signed: 03/21/2018 4:51:34 PM By: Lorine Bears RCP, RRT, CHT Entered By: Lorine Bears on 03/21/2018 10:04:29 [name], [name] ([id number]) -------------------------------------------------------------------------------- Encounter Discharge Information Details Patient Name: [name] [name] Date of Service: 03/21/2018 10:00 AM Medical Record Number: [id number] Patient Account Number: [id number] Date of Birth/Sex: 12/08/1960 (57 y.o. M) Treating RN: Cornell Barman Primary Care Wyatt Galvan: Soyla Dryer Other Clinician: Referring Zackari Ruane: Soyla Dryer Treating Jillann Charette/Extender: Tito Dine in Treatment: 8 Encounter Discharge Information Items Discharge Condition: Stable Ambulatory Status: Ambulatory Discharge  Destination: Home Transportation: Private Auto Accompanied By: mom Schedule Follow-up Appointment: No Clinical Summary of Care: Post Procedure Vitals: Temperature (F): 98.2 Pulse (bpm): 68 Respiratory Rate (breaths/min): 18 Blood Pressure (mmHg): 121/64 Electronic Signature(s) Signed: 03/21/2018 5:34:51 PM By: Gretta Cool, BSN, RN, CWS, Kim RN, BSN Entered By: Gretta Cool, BSN, RN, CWS, Kim on 03/21/2018 10:35:22 [name], [name] ([id number]) -------------------------------------------------------------------------------- Lower Extremity Assessment Details Patient Name: [name] [name] Date of Service: 03/21/2018 10:00 AM Medical Record Number: [id number] Patient Account Number: [id number] Date of Birth/Sex: [birth year] (57 y.o. M) Treating RN: Secundino Ginger Primary Care Anani Gu: Soyla Dryer Other Clinician: Referring Mychelle Kendra: Soyla Dryer Treating Phylisha Dix/Extender: Ricard Dillon Weeks in Treatment: 8 Electronic Signature(s) Signed: 03/21/2018 11:44:54 AM By: Secundino Ginger Entered By: Secundino Ginger on 03/21/2018 10:08:34 [name], [name] ([id number]) -------------------------------------------------------------------------------- Multi Wound Chart Details Patient Name: [name] [name] Date of Service: 03/21/2018 10:00 AM Medical Record Number: [id number] Patient Account Number: [id number] Date of Birth/Sex: 1960/08/29 (57 y.o. M) Treating RN: Cornell Barman Primary Care Alontae Chaloux: Soyla Dryer Other Clinician: Referring Lashun Mccants: Soyla Dryer Treating Aquanetta Schwarz/Extender: Tito Dine in Treatment: 8 Vital Signs Height(in): 70 Pulse(bpm): 23 Weight(lbs): 139 Blood Pressure(mmHg): 121/64 Body Mass Index(BMI): 20 Temperature(F): 98.2 Respiratory Rate 18 (breaths/min): Photos: [N/A:N/A] Wound Location: Right Toe Great - Plantar N/A N/A Wounding Event: Gradually Appeared N/A N/A Primary Etiology: Diabetic Wound/Ulcer of the N/A N/A Lower Extremity Comorbid History:  Cataracts, Hypertension, Type N/A N/A II Diabetes, Rheumatoid Arthritis, Neuropathy Date Acquired: 12/24/2017 N/A N/A Weeks of Treatment: 8 N/A N/A Wound Status: Open N/A N/A Measurements L x W x D 0.7x0.6x0.3 N/A N/A (cm) Area (cm) : 0.33 N/A N/A Volume (cm) : 0.099 N/A N/A % Reduction in Area: 40.00% N/A N/A % Reduction in Volume: 70.00% N/A N/A Classification: Grade 2 N/A N/A Exudate Amount: Medium N/A N/A Exudate Type: Serosanguineous N/A N/A Exudate Color: red, Parthasarathy N/A N/A Wound Margin: Distinct, outline attached N/A N/A Granulation Amount: Medium (34-66%) N/A N/A Granulation Quality: Red, Pink N/A N/A Necrotic Amount: Small (1-33%) N/A N/A Exposed Structures: Fat  Layer (Subcutaneous N/A N/A Tissue) Exposed: Yes Fascia: No Tendon: No Muscle: No [name], [name] ([id number]) Joint: No Bone: No Epithelialization: None N/A N/A Debridement: Debridement - Selective/Open N/A N/A Wound Pre-procedure 10:24 N/A N/A Verification/Time Out Taken: Pain Control: Other N/A N/A Tissue Debrided: Callus N/A N/A Level: Skin/Dermis N/A N/A Debridement Area (sq cm): 0.42 N/A N/A Instrument: Curette N/A N/A Bleeding: None N/A N/A Debridement Treatment Procedure was tolerated well N/A N/A Response: Post Debridement 0.7x0.5x0.1 N/A N/A Measurements L x W x D (cm) Post Debridement Volume: 0.027 N/A N/A (cm) Periwound Skin Texture: Callus: Yes N/A N/A Excoriation: No Induration: No Crepitus: No Rash: No Scarring: No Periwound Skin Moisture: Maceration: Yes N/A N/A Dry/Scaly: No Periwound Skin Color: Atrophie Blanche: No N/A N/A Cyanosis: No Ecchymosis: No Erythema: No Hemosiderin Staining: No Mottled: No Pallor: No Rubor: No Temperature: No Abnormality N/A N/A Tenderness on Palpation: No N/A N/A Wound Preparation: Ulcer Cleansing: N/A N/A Rinsed/Irrigated with Saline Topical Anesthetic Applied: Other: lidocaine 4% Procedures Performed: Debridement N/A  N/A Treatment Notes Wound #1 (Right, Plantar Toe Great) 1. Cleansed with: Clean wound with Normal Saline 2. Anesthetic Topical Lidocaine 4% cream to wound bed prior to debridement 4. Dressing Applied: Prisma Ag 5. Secondary Dressing Applied Dry Gauze Kerlix/Conform [name], [name] ([id number]) Notes foam, secured with conform and tape, darco front offloader Electronic Signature(s) Signed: 03/21/2018 6:14:59 PM By: Linton Ham MD Entered By: Linton Ham on 03/21/2018 10:42:07 [place], Anthon ([id number]) -------------------------------------------------------------------------------- Multi-Disciplinary Care Plan Details Patient Name: [name] [name] Date of Service: 03/21/2018 10:00 AM Medical Record Number: [id number] Patient Account Number: [id number] Date of Birth/Sex: 07/13/1960 (57 y.o. M) Treating RN: Cornell Barman Primary Care Amayia Ciano: Soyla Dryer Other Clinician: Referring Venice Marcucci: Soyla Dryer Treating Quill Grinder/Extender: Tito Dine in Treatment: 8 Active Inactive ` Necrotic Tissue Nursing Diagnoses: Knowledge deficit related to management of necrotic/devitalized tissue Goals: Necrotic/devitalized tissue will be minimized in the wound bed Date Initiated: 01/24/2018 Target Resolution Date: 02/09/2018 Goal Status: Active Interventions: Assess patient pain level pre-, during and post procedure and prior to discharge Treatment Activities: Excisional debridement : 01/24/2018 Notes: ` Orientation to the Wound Care Program Nursing Diagnoses: Knowledge deficit related to the wound healing center program Goals: Patient/caregiver will verbalize understanding of the [facility] Date Initiated: 01/24/2018 Target Resolution Date: 02/09/2018 Goal Status: Active Interventions: Provide education on orientation to the wound center Notes: ` Pressure Nursing Diagnoses: Knowledge deficit related to management of pressures  ulcers Goals: Patient will remain free from development of additional pressure ulcers Date Initiated: 01/24/2018 Target Resolution Date: 02/09/2018 [name] [name], [name] [name] ([id number]) Goal Status: Active Interventions: Assess potential for pressure ulcer upon admission and as needed Notes: ` Wound/Skin Impairment Nursing Diagnoses: Knowledge deficit related to ulceration/compromised skin integrity Goals: Ulcer/skin breakdown will have a volume reduction of 30% by week 4 Date Initiated: 01/24/2018 Target Resolution Date: 02/24/2018 Goal Status: Active Interventions: Assess ulceration(s) every visit Treatment Activities: Topical wound management initiated : 01/24/2018 Notes: Electronic Signature(s) Signed: 03/21/2018 5:34:51 PM By: Gretta Cool, BSN, RN, CWS, Kim RN, BSN Entered By: Gretta Cool, BSN, RN, CWS, Kim on 03/21/2018 10:22:53 [place], [name] ([id number]) -------------------------------------------------------------------------------- Pain Assessment Details Patient Name: [name] [name] Date of Service: 03/21/2018 10:00 AM Medical Record Number: [id number] Patient Account Number: [id number] Date of Birth/Sex: Jul 11, 1960 (57 y.o. M) Treating RN: Cornell Barman Primary Care Ravis Herne: Soyla Dryer Other Clinician: Referring Porcia Morganti: Soyla Dryer Treating Deangela Randleman/Extender: Tito Dine in Treatment: 8 Active Problems Location of Pain Severity and Description of Pain Patient Has Paino No  Site Locations Pain Management and Medication Current Pain Management: Electronic Signature(s) Signed: 03/21/2018 4:51:34 PM By: Lorine Bears RCP, RRT, CHT Signed: 03/21/2018 5:34:51 PM By: Gretta Cool, BSN, RN, CWS, Kim RN, BSN Entered By: Lorine Bears on 03/21/2018 10:04:36 [name] [name], [name] [name] ([id number]) -------------------------------------------------------------------------------- Patient/Caregiver Education Details Patient Name: [name] [name] Date of Service:  03/21/2018 10:00 AM Medical Record Number: [id number] Patient Account Number: [id number] Date of Birth/Gender: Aug 08, 1960 (57 y.o. M) Treating RN: Cornell Barman Primary Care Physician: Soyla Dryer Other Clinician: Referring Physician: Soyla Dryer Treating Physician/Extender: Tito Dine in Treatment: 8 Education Assessment Education Provided To: Patient Education Topics Provided Offloading: Handouts: What is Offloadingo Methods: Demonstration, Explain/Verbal Responses: State content correctly Wound/Skin Impairment: Handouts: Caring for Your Ulcer, Other: continue wound care as prescribed Methods: Demonstration, Explain/Verbal Responses: State content correctly Electronic Signature(s) Signed: 03/21/2018 5:34:51 PM By: Gretta Cool, BSN, RN, CWS, Kim RN, BSN Entered By: Gretta Cool, BSN, RN, CWS, Kim on 03/21/2018 10:35:27 [name] [name], [name] ([id number]) -------------------------------------------------------------------------------- Wound Assessment Details Patient Name: [name] [name] Date of Service: 03/21/2018 10:00 AM Medical Record Number: [id number] Patient Account Number: [id number] Date of Birth/Sex: [birth year] (57 y.o. M) Treating RN: Secundino Ginger Primary Care Loney Peto: Soyla Dryer Other Clinician: Referring Anniston Nellums: Soyla Dryer Treating Shuna Tabor/Extender: Tito Dine in Treatment: 8 Wound Status Wound Number: 1 Primary Diabetic Wound/Ulcer of the Lower Extremity Etiology: Wound Location: Right Toe Great - Plantar Wound Open Wounding Event: Gradually Appeared Status: Date Acquired: 12/24/2017 Comorbid Cataracts, Hypertension, Type II Diabetes, Weeks Of Treatment: 8 History: Rheumatoid Arthritis, Neuropathy Clustered Wound: No Photos Photo Uploaded By: Secundino Ginger on 03/21/2018 10:16:58 Wound Measurements Length: (cm) 0.7 % Reductio Width: (cm) 0.6 % Reductio Depth: (cm) 0.3 Epithelial Area: (cm) 0.33 Tunneling Volume: (cm) 0.099  Undermini n in Area: 40% n in Volume: 70% ization: None : No ng: No Wound Description Classification: Grade 2 Foul Odor Wound Margin: Distinct, outline attached Slough/Fi Exudate Amount: Medium Exudate Type: Serosanguineous Exudate Color: red, [place] After Cleansing: No brino No Wound Bed Granulation Amount: Medium (34-66%) Exposed Structure Granulation Quality: Red, Pink Fascia Exposed: No Necrotic Amount: Small (1-33%) Fat Layer (Subcutaneous Tissue) Exposed: Yes Necrotic Quality: Adherent Slough Tendon Exposed: No Muscle Exposed: No Joint Exposed: No Bone Exposed: No Periwound Skin Texture Mcclenney, [name] ([id number]) Texture Color No Abnormalities Noted: No No Abnormalities Noted: No Callus: Yes Atrophie Blanche: No Crepitus: No Cyanosis: No Excoriation: No Ecchymosis: No Induration: No Erythema: No Rash: No Hemosiderin Staining: No Scarring: No Mottled: No Pallor: No Moisture Rubor: No No Abnormalities Noted: No Dry / Scaly: No Temperature / Pain Maceration: Yes Temperature: No Abnormality Wound Preparation Ulcer Cleansing: Rinsed/Irrigated with Saline Topical Anesthetic Applied: Other: lidocaine 4%, Treatment Notes Wound #1 (Right, Plantar Toe Great) 1. Cleansed with: Clean wound with Normal Saline 2. Anesthetic Topical Lidocaine 4% cream to wound bed prior to debridement 4. Dressing Applied: Prisma Ag 5. Secondary Dressing Applied Dry Gauze Kerlix/Conform Notes foam, secured with conform and tape, darco front offloader Electronic Signature(s) Signed: 03/21/2018 11:44:54 AM By: Secundino Ginger Entered By: Secundino Ginger on 03/21/2018 10:14:52 [name] [name], [name] ([id number]) -------------------------------------------------------------------------------- [facility] Details Patient Name: [name] [name] Date of Service: 03/21/2018 10:00 AM Medical Record Number: [id number] Patient Account Number: [id number] Date of Birth/Sex: 04/26/1961 (57 y.o. M) Treating RN:  Cornell Barman Primary Care Levent Kornegay: Soyla Dryer Other Clinician: Referring Scotlynn Noyes: Soyla Dryer Treating Victory D[place]/Extender: Tito Dine in Treatment: 8 Vital Signs Time Taken: 10:02 Temperature (F): 98.2 Height (in): 70 Pulse (bpm): 68 Weight (lbs): 139 Respiratory  Rate (breaths/min): 18 Body Mass Index (BMI): 19.9 Blood Pressure (mmHg): 121/64 Reference Range: 80 - 120 mg / dl Electronic Signature(s) Signed: 03/21/2018 4:51:34 PM By: Lorine Bears RCP, RRT, CHT Entered By: Lorine Bears on 03/21/2018 10:07:13

## 2018-03-24 NOTE — Progress Notes (Signed)
NECO, KLING ([id number]) Visit Report for 03/21/2018 Debridement Details Patient Name: TYE, VIGO Date of Service: 03/21/2018 10:00 AM Medical Record Number: [id number] Patient Account Number: [id number] Date of Birth/Sex: [birth year] (57 y.o. M) Treating RN: Cornell Barman Primary Care Provider: Soyla Dryer Other Clinician: Referring Provider: Soyla Dryer Treating Provider/Extender: Tito Dine in Treatment: 8 Debridement Performed for Wound #1 Right,Plantar Toe Great Assessment: Performed By: Physician Ricard Dillon, MD Debridement Type: Debridement Severity of Tissue Pre Fat layer exposed Debridement: Level of Consciousness (Pre- Awake and Alert procedure): Pre-procedure Verification/Time Yes - 10:24 Out Taken: Start Time: 10:24 Pain Control: Other : lidocaine 4% Total Area Debrided (L x W): 0.7 (cm) x 0.6 (cm) = 0.42 (cm) Tissue and other material Non-Viable, Callus, Skin: Dermis debrided: Level: Skin/Dermis Debridement Description: Selective/Open Wound Instrument: Curette Bleeding: None End Time: 10:25 Response to Treatment: Procedure was tolerated well Level of Consciousness Awake and Alert (Post-procedure): Post Debridement Measurements of Total Wound Length: (cm) 0.7 Width: (cm) 0.5 Depth: (cm) 0.1 Volume: (cm) 0.027 Character of Wound/Ulcer Post Debridement: Stable Severity of Tissue Post Debridement: Fat layer exposed Post Procedure Diagnosis Same as Pre-procedure Electronic Signature(s) Signed: 03/21/2018 5:34:51 PM By: Gretta Cool, BSN, RN, CWS, Kim RN, BSN Signed: 03/21/2018 6:14:59 PM By: Linton Ham MD Entered By: Linton Ham on 03/21/2018 10:42:41 [place], Loki ([id number]) -------------------------------------------------------------------------------- HPI Details Patient Name: [name] [name] Date of Service: 03/21/2018 10:00 AM Medical Record Number: [id number] Patient Account Number: [id number] Date of Birth/Sex:  16-Nov-1960 (57 y.o. M) Treating RN: Cornell Barman Primary Care Provider: Soyla Dryer Other Clinician: Referring Provider: Soyla Dryer Treating Provider/Extender: Tito Dine in Treatment: 8 History of Present Illness HPI Description: 01/24/18-He is seen in initial evaluation for right great toe ulcer. He was sent to the emergency room on 718 from the nurse clinic office for new-onset diabetes with an A1c of 12.9. He was discharged from the emergency room on metformin, had a right foot x-ray that was negative for osteomyelitis and had a follow-up with his primary care at the free clinic on 7/22. He has been treated twice with antibiotic therapy; bactrim (7/8) and doxycycline (7/18). He is neuropathic. He tolerated debridement; bone is palpable through a thin layer of tissue; there is, what appears to be, resolving erythema and edema; a culture was taken, antibiotics will be prescribed as needed based on sensitivities. 01/31/18; this is a patient with new onset type 2 diabetes who is on oral agents. Culture from last time showed a few Klebsiella and a few enterococcus is being appropriately treated with a combination of Augmentin and ciprofloxacin for 10 days which I think he just started yesterday. He has to get his medications through the free clinic in [place]. He has no insurance. Been using Prisma to the wound. Previous x-ray last month was negative for osteomyelitis.his ABIs look reasonable. He has an offloading sandal 02/07/18; he completes his antibiotics which are Augmentin and ciprofloxacin on Saturday. Using silver collagen. Once again he has callus around the wound and not a very viable looking wound surface. We have been giving him an offloading sandal but I think he is going to require more offloading than that. Previous x-ray was negative. ABIs in our clinic quite reasonable 02/14/18; he arrives today with the wound dimensions slightly smaller. Still callus nonviable  subcutaneous tissue around the wound circumference. He is still in a offloading shoe rather than the Darco. I transitioned him into the Darco today 02/21/18; slight improvement in dimensions. Still aggressive debridements using  Prisma. He is using a Darco forefoot offloading boot 02/28/18; not much improvement. Still requiring aggressive debridement using Prisma. I changed him to Iodoflex to help with the wound debridement. He is using a Darco forefoot offloading boot but tells me he had 1 fall this week. There is not an option for more aggressive offloading. x-ray I ordered last week was negative for osteomyelitis 03/07/18; not much improvement. He arrives with thick skin subcutaneous tissue overhanging the wound covered and callus. This requires debridement. Post debridement the wound surface looks pretty good. As usual have thoughts about of total contact cast but after talking to the patient and his mother and observing the patient leave the clinic there is absolutely no way he could tolerate a total contact cast. He is barely able to maintain balance and the Darco forefoot boot. I suspect this is secondary to diabetic neuropathy 03/14/18; better looking wound today. Plantar aspect of the right great toe. Still requires debridement but less adherent callus nonviable tissue and less debris over the wound surface. He is tolerating the Darco forefoot boot marginally, not a candidate for a TCC. 03/21/18; wound looks a lot better. Still some depth although the base of the wound looks healthy. He still has a fair amount or raised callus which requires debridement. He is tolerating the Darco forefoot boot. He is not a candidate for a TCC secondary to gait imbalance issues Electronic Signature(s) Signed: 03/21/2018 6:14:59 PM By: Linton Ham MD Entered By: Linton Ham on 03/21/2018 10:43:32 [place], [place]  ([id number]) -------------------------------------------------------------------------------- Physical Exam Details Patient Name: [name] [name] Date of Service: 03/21/2018 10:00 AM Medical Record Number: [id number] Patient Account Number: [id number] Date of Birth/Sex: 1961/03/11 (57 y.o. M) Treating RN: Cornell Barman Primary Care Provider: Soyla Dryer Other Clinician: Referring Provider: Soyla Dryer Treating Provider/Extender: Tito Dine in Treatment: 8 Notes When exam; certainly less necrotic debris on the wound surface. He has callus nonviable skin around the circumference which I removed with a #3 curet. Electronic Signature(s) Signed: 03/21/2018 6:14:59 PM By: Linton Ham MD Entered By: Linton Ham on 03/21/2018 10:44:02 [address], [place] ([id number]) -------------------------------------------------------------------------------- Physician Orders Details Patient Name: [name] [name] Date of Service: 03/21/2018 10:00 AM Medical Record Number: [id number] Patient Account Number: [id number] Date of Birth/Sex: [birth year] (57 y.o. M) Treating RN: Cornell Barman Primary Care Provider: Soyla Dryer Other Clinician: Referring Provider: Soyla Dryer Treating Provider/Extender: Tito Dine in Treatment: 8 Verbal / Phone Orders: No Diagnosis Coding Wound Cleansing Wound #1 Right,Plantar Toe Great o Clean wound with Normal Saline. Anesthetic (add to Medication List) Wound #1 Right,Plantar Toe Great o Topical Lidocaine 4% cream applied to wound bed prior to debridement (In Clinic Only). Primary Wound Dressing Wound #1 Right,Plantar Toe Great o Silver Collagen Secondary Dressing Wound #1 Right,Plantar Toe Great o Gauze and Kerlix/Conform Dressing Change Frequency Wound #1 Right,Plantar Toe Great o Change Dressing Monday, Wednesday, Friday Follow-up Appointments Wound #1 Right,Plantar Toe Great o Return Appointment in 1  week. Off-Loading Wound #1 Right,Plantar Toe Great o Open toe surgical shoe to: Banker Orders / Instructions Wound #1 Right,Plantar Toe Great o Increase protein intake. Electronic Signature(s) Signed: 03/21/2018 5:34:51 PM By: Gretta Cool, BSN, RN, CWS, Kim RN, BSN Signed: 03/21/2018 6:14:59 PM By: Linton Ham MD Entered By: Gretta Cool, BSN, RN, CWS, Kim on 03/21/2018 10:25:38 LAITHAN, CONCHAS ([id number]) -------------------------------------------------------------------------------- Problem List Details Patient Name: BUREN, HAVEY Date of Service: 03/21/2018 10:00 AM Medical Record Number: [id number] Patient Account Number: [id number] Date of Birth/Sex: 24-Aug-1960 (57 y.o.  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 M) Treating RN: Cornell Barman Primary Care Provider: Soyla Dryer Other Clinician: Referring Provider: Soyla Dryer Treating Provider/Extender: Tito Dine in Treatment:  8 Subjective History of Present Illness (HPI) 01/24/18-He is seen in initial evaluation for right great toe ulcer. He was sent to the emergency room on 718 from the nurse clinic office for new-onset diabetes with an A1c of 12.9. He was discharged from the emergency room on metformin, had a right foot x-ray that was negative for osteomyelitis and had a follow-up with his primary care at the free clinic on 7/22. He has been treated twice with antibiotic therapy; bactrim (7/8) and doxycycline (7/18). He is neuropathic. He tolerated debridement; bone is palpable through a thin layer of tissue; there is, what appears to be, resolving erythema and edema; a culture was taken, antibiotics will be prescribed as needed based on sensitivities. 01/31/18; this is a patient with new onset type 2 diabetes who is on oral agents. Culture from last time showed a few Klebsiella and a few enterococcus is being appropriately treated with a combination of Augmentin and ciprofloxacin for 10 days which I think he just started yesterday. He has to get his medications through the free clinic in [place]. He has no insurance. Been using Prisma to the wound. Previous x-ray last month was negative for osteomyelitis.his ABIs look reasonable. He has an offloading sandal 02/07/18; he completes his antibiotics which are Augmentin and ciprofloxacin on Saturday. Using silver collagen. Once again he has callus around the wound and not a very viable looking wound surface. We have been giving him an offloading sandal but I think he is going to require more offloading than that. Previous x-ray was negative. ABIs in our clinic quite reasonable 02/14/18; he arrives today with the wound dimensions slightly smaller. Still callus nonviable subcutaneous tissue around the wound circumference. He is still in a offloading shoe rather than the Darco. I transitioned him into the Darco today 02/21/18; slight improvement in dimensions. Still aggressive  debridements using Prisma. He is using a Darco forefoot offloading boot 02/28/18; not much improvement. Still requiring aggressive debridement using Prisma. I changed him to Iodoflex to help with the wound debridement. He is using a Darco forefoot offloading boot but tells me he had 1 fall this week. There is not an option for more aggressive offloading. x-ray I ordered last week was negative for osteomyelitis 03/07/18; not much improvement. He arrives with thick skin subcutaneous tissue overhanging the wound covered and callus. This requires debridement. Post debridement the wound surface looks pretty good. As usual have thoughts about of total contact cast but after talking to the patient and his mother and observing the patient leave the clinic there is absolutely no way he could tolerate a total  contact cast. He is barely able to maintain balance and the Darco forefoot boot. I suspect this is secondary to diabetic neuropathy 03/14/18; better looking wound today. Plantar aspect of the right great toe. Still requires debridement but less adherent callus nonviable tissue and less debris over the wound surface. He is tolerating the Darco forefoot boot marginally, not a candidate for a TCC. 03/21/18; wound looks a lot better. Still some depth although the base of the wound looks healthy. He still has a fair amount or raised callus which requires debridement. He is tolerating the Darco forefoot boot. He is not a candidate for a TCC secondary to gait imbalance issues Objective Constitutional Vitals Time Taken: 10:02 AM, Height: 70 in, Weight: 139 lbs, BMI: 19.9, Temperature: 98.2 F, Pulse: 68 bpm, Respiratory [place], [place]an ([id number]) Rate: 18 breaths/min, Blood Pressure: 121/64 mmHg. Integumentary (Hair, Skin) Wound #1 status is Open. Original cause of wound was Gradually Appeared. The wound is located on the [employer]. The wound measures 0.7cm length x 0.6cm width x 0.3cm depth;  0.33cm^2 area and 0.099cm^3 volume. There is Fat Layer (Subcutaneous Tissue) Exposed exposed. There is no tunneling or undermining noted. There is a medium amount of serosanguineous drainage noted. The wound margin is distinct with the outline attached to the wound base. There is medium (34-66%) red, pink granulation within the wound bed. There is a small (1-33%) amount of necrotic tissue within the wound bed including Adherent Slough. The periwound skin appearance exhibited: Callus, Maceration. The periwound skin appearance did not exhibit: Crepitus, Excoriation, Induration, Rash, Scarring, Dry/Scaly, Atrophie Blanche, Cyanosis, Ecchymosis, Hemosiderin Staining, Mottled, Pallor, Rubor, Erythema. Periwound temperature was noted as No Abnormality. Assessment Active Problems ICD-10 Type 2 diabetes mellitus with foot ulcer Non-pressure chronic ulcer of other part of right foot with necrosis of muscle Type 2 diabetes mellitus with diabetic neuropathy, unspecified Procedures Wound #1 Pre-procedure diagnosis of Wound #1 is a Diabetic Wound/Ulcer of the Lower Extremity located on the Right,Plantar Toe Great .Severity of Tissue Pre Debridement is: Fat layer exposed. There was a Selective/Open Wound Skin/Dermis Debridement with a total area of 0.42 sq cm performed by Ricard Dillon, MD. With the following instrument(s): Curette to remove Non-Viable tissue/material. Material removed includes Callus and Skin: Dermis and after achieving pain control using Other (lidocaine 4%). A time out was conducted at 10:24, prior to the start of the procedure. There was no bleeding. The procedure was tolerated well. Post Debridement Measurements: 0.7cm length x 0.5cm width x 0.1cm depth; 0.027cm^3 volume. Character of Wound/Ulcer Post Debridement is stable. Severity of Tissue Post Debridement is: Fat layer exposed. Post procedure Diagnosis Wound #1: Same as Pre-Procedure Plan Wound Cleansing: Wound #1  Right,Plantar Toe Great: Clean wound with Normal Saline. Anesthetic (add to Medication List): Wound #1 Right,Plantar Toe Great: Topical Lidocaine 4% cream applied to wound bed prior to debridement (In Clinic Only). Primary Wound Dressing: Wound #1 Right,Plantar Toe GreatRamey Schiff, Shaydon ([id number]) Silver Collagen Secondary Dressing: Wound #1 Right,Plantar Toe Great: Gauze and Kerlix/Conform Dressing Change Frequency: Wound #1 Right,Plantar Toe Great: Change Dressing Monday, Wednesday, Friday Follow-up Appointments: Wound #1 Right,Plantar Toe Great: Return Appointment in 1 week. Off-Loading: Wound #1 Right,Plantar Toe Great: Open toe surgical shoe to: Banker Orders / Instructions: Wound #1 Right,Plantar Toe Great: Increase protein intake. #1 Silver collagen to continue/Kerlix and con form #2 continue the Darco forefoot off loader which she appears to be tolerating and claims to be compliant. This seems to be verified by  his mother Electronic Signature(s) Signed: 03/21/2018 6:14:59 PM By: Linton Ham MD Entered By: Linton Ham on 03/21/2018 10:45:04 [place], [place] ([id number]) -------------------------------------------------------------------------------- SuperBill Details Patient Name: [name] [name] Date of Service: 03/21/2018 Medical Record Number: [id number] Patient Account Number: [id number] Date of Birth/Sex: 1960-10-14 (57 y.o. M) Treating RN: Cornell Barman Primary Care Provider: Soyla Dryer Other Clinician: Referring Provider: Soyla Dryer Treating Provider/Extender: Tito Dine in Treatment: 8 Diagnosis Coding ICD-10 Codes Code Description E11.621 Type 2 diabetes mellitus with foot ulcer L97.513 Non-pressure chronic ulcer of other part of right foot with necrosis of muscle E11.40 Type 2 diabetes mellitus with diabetic neuropathy, unspecified Facility Procedures CPT4 Code Description: [id number] - DEBRIDE WOUND 1ST  20 SQ CM OR < ICD-10 Diagnosis Description L97.513 Non-pressure chronic ulcer of other part of right foot with necr Modifier: osis of muscle Quantity: 1 Physician Procedures CPT4 Code Description: [id number] - WC PHYS DEBR WO ANESTH 20 SQ CM ICD-10 Diagnosis Description L97.513 Non-pressure chronic ulcer of other part of right foot with necr Modifier: osis of muscle Quantity: 1 Electronic Signature(s) Signed: 03/21/2018 6:14:59 PM By: Linton Ham MD Entered By: Linton Ham on 03/21/2018 10:45:31

## 2018-03-28 ENCOUNTER — Ambulatory Visit: Payer: Self-pay | Admitting: Physician Assistant

## 2018-03-28 ENCOUNTER — Encounter: Payer: Self-pay | Admitting: Physician Assistant

## 2018-03-28 VITALS — BP 116/71 | HR 88 | Temp 97.7°F

## 2018-03-28 DIAGNOSIS — I10 Essential (primary) hypertension: Secondary | ICD-10-CM

## 2018-03-28 DIAGNOSIS — S91109S Unspecified open wound of unspecified toe(s) without damage to nail, sequela: Secondary | ICD-10-CM

## 2018-03-28 DIAGNOSIS — Z1211 Encounter for screening for malignant neoplasm of colon: Secondary | ICD-10-CM

## 2018-03-28 DIAGNOSIS — E785 Hyperlipidemia, unspecified: Secondary | ICD-10-CM

## 2018-03-28 DIAGNOSIS — E1169 Type 2 diabetes mellitus with other specified complication: Secondary | ICD-10-CM

## 2018-03-28 NOTE — Progress Notes (Signed)
BP 116/71   Pulse 88   Temp 97.7 F (36.5 C)   SpO2 99%    Subjective:    Patient ID: [name] [name], male    DOB: 05/15/1961, 57 y.o.   MRN: [id number]  HPI: [name] [name] is a 57 y.o. male presenting on 03/28/2018 for No chief complaint on file.   HPI   Pt here to follow up diabetes, htn, cholesterol.  Pt forgot to bring his meds and isn't sure about some of them- the names.    Pt continues with wound care clinic and has appointment to follow up there next week  Pt has no other complaints  Relevant past medical, surgical, family and social history reviewed and updated as indicated. Interim medical history since our last visit reviewed. Allergies and medications reviewed and updated.\   Current Outpatient Medications:  .  acetaminophen (TYLENOL) 500 MG tablet, Take 500 mg by mouth every 6 (six) hours as needed (arthritis). , Disp: , Rfl:  .  Calcium Carbonate-Simethicone (ALKA-SELTZER HEARTBURN + GAS) 750-80 MG CHEW, Chew 1 tablet by mouth daily as needed., Disp: , Rfl:  .  famotidine (PEPCID) 20 MG tablet, Take 1 tablet (20 mg total) by mouth 2 (two) times daily as needed for heartburn or indigestion., Disp: 60 tablet, Rfl: 3 .  metFORMIN (GLUCOPHAGE) 1000 MG tablet, Take 1 tablet (1,000 mg total) by mouth 2 (two) times daily with a meal., Disp: 180 tablet, Rfl: 1 .  Omega-3 Fatty Acids (FISH OIL) 1200 MG CAPS, Take 1 capsule (1,200 mg total) by mouth 2 (two) times daily., Disp: , Rfl:  .  sitaGLIPtin (JANUVIA) 100 MG tablet, Take 1 tablet (100 mg total) by mouth daily., Disp: 90 tablet, Rfl: 0 .  atorvastatin (LIPITOR) 20 MG tablet, Take 1 tablet (20 mg total) by mouth daily., Disp: 90 tablet, Rfl: 1 .  lisinopril (PRINIVIL,ZESTRIL) 10 MG tablet, Take 1 tablet (10 mg total) by mouth daily. (Patient not taking: Reported on 03/28/2018), Disp: 90 tablet, Rfl: 0   Review of Systems  Constitutional: Positive for appetite change, fatigue and unexpected weight change. Negative  for chills, diaphoresis and fever.  HENT: Negative for congestion, dental problem, drooling, ear pain, facial swelling, hearing loss, mouth sores, sneezing, sore throat, trouble swallowing and voice change.   Eyes: Positive for visual disturbance. Negative for pain, discharge, redness and itching.  Respiratory: Negative for cough, choking, shortness of breath and wheezing.   Cardiovascular: Negative for chest pain, palpitations and leg swelling.  Gastrointestinal: Negative for abdominal pain, blood in stool, constipation, diarrhea and vomiting.  Endocrine: Negative for cold intolerance, heat intolerance and polydipsia.  Genitourinary: Negative for decreased urine volume, dysuria and hematuria.  Musculoskeletal: Positive for arthralgias and gait problem. Negative for back pain.  Skin: Negative for rash.  Allergic/Immunologic: Positive for environmental allergies.  Neurological: Negative for seizures, syncope, light-headedness and headaches.  Hematological: Negative for adenopathy.  Psychiatric/Behavioral: Negative for agitation, dysphoric mood and suicidal ideas. The patient is not nervous/anxious.     Per HPI unless specifically indicated above     Objective:    BP 116/71   Pulse 88   Temp 97.7 F (36.5 C)   SpO2 99%   Wt Readings from Last 3 Encounters:  02/14/18 134 lb 8 oz (61 kg)  01/15/18 139 lb (63 kg)  01/08/18 141 lb 4 oz (64.1 kg)    Physical Exam  Constitutional: He is oriented to person, place, and time. He appears well-developed and well-nourished.  HENT:  Head: Normocephalic and atraumatic.  Neck: Neck supple.  Cardiovascular: Normal rate and regular rhythm.  Pulmonary/Chest: Effort normal and breath sounds normal. He has no wheezes.  Abdominal: Soft. Bowel sounds are normal. There is no hepatosplenomegaly. There is no tenderness.  Musculoskeletal: He exhibits no edema.  Lymphadenopathy:    He has no cervical adenopathy.  Neurological: He is alert and oriented  to person, place, and time.  Skin: Skin is warm and dry.  Toe wound improved but still open. No signs infection  Psychiatric: He has a normal mood and affect. His behavior is normal.  Vitals reviewed.   Results for orders placed or performed during the hospital encounter of 03/23/18  Lipid panel  Result Value Ref Range   Cholesterol 118 0 - 200 mg/dL   Triglycerides 102 <150 mg/dL   HDL 42 >40 mg/dL   Total CHOL/HDL Ratio 2.8 RATIO   VLDL 20 0 - 40 mg/dL   LDL Cholesterol 56 0 - 99 mg/dL  Comprehensive metabolic panel  Result Value Ref Range   Sodium 141 135 - 145 mmol/L   Potassium 4.2 3.5 - 5.1 mmol/L   Chloride 106 98 - 111 mmol/L   CO2 25 22 - 32 mmol/L   Glucose, Bld 178 (H) 70 - 99 mg/dL   BUN 17 6 - 20 mg/dL   Creatinine, Ser 0.60 (L) 0.61 - 1.24 mg/dL   Calcium 9.5 8.9 - 10.3 mg/dL   Total Protein 7.5 6.5 - 8.1 g/dL   Albumin 4.2 3.5 - 5.0 g/dL   AST 12 (L) 15 - 41 U/L   ALT 12 0 - 44 U/L   Alkaline Phosphatase 61 38 - 126 U/L   Total Bilirubin 1.0 0.3 - 1.2 mg/dL   GFR calc non Af Amer >60 >60 mL/min   GFR calc Af Amer >60 >60 mL/min   Anion gap 10 5 - 15  Hemoglobin A1c  Result Value Ref Range   Hgb A1c MFr Bld 7.1 (H) 4.8 - 5.6 %   Mean Plasma Glucose 157.07 mg/dL      Assessment & Plan:   Encounter Diagnoses  Name Primary?  . Type 2 diabetes mellitus with other specified complication, unspecified whether long term insulin use ([place]) Yes  . Hyperlipidemia, unspecified hyperlipidemia type   . Essential hypertension   . Open toe wound, sequela   . Screening for colon cancer     -reviewed labs with pt -Pt has appointment for DM eye exam early November -pt to Continue current rx -pt is given iFOBT for colon cancer screening -pt to follow up in 3 months.  RTO sooner prn

## 2018-04-04 ENCOUNTER — Encounter: Payer: Self-pay | Admitting: Internal Medicine

## 2018-04-06 NOTE — Progress Notes (Signed)
[name] [name] ([id number]) Visit Report for 04/04/2018 Debridement Details Patient Name: [name] [name] Date of Service: 04/04/2018 10:15 AM Medical Record Number: [id number] Patient Account Number: [id number] Date of Birth/Sex: May 29, 1961 (57 y.o. M) Treating RN: Cornell Barman Primary Care Provider: Soyla Dryer Other Clinician: Referring Provider: Soyla Dryer Treating Provider/Extender: Tito Dine in Treatment: 10 Debridement Performed for Wound #1 Right,Plantar Toe Great Assessment: Performed By: Physician Ricard Dillon, MD Debridement Type: Debridement Severity of Tissue Pre Fat layer exposed Debridement: Level of Consciousness (Pre- Awake and Alert procedure): Pre-procedure Verification/Time Yes - 10:30 Out Taken: Start Time: 10:30 Pain Control: Lidocaine Total Area Debrided (L x W): 0.7 (cm) x 0.6 (cm) = 0.42 (cm) Tissue and other material Viable, Non-Viable, Callus, Subcutaneous debrided: Level: Skin/Subcutaneous Tissue Debridement Description: Excisional Instrument: Curette Bleeding: Moderate Hemostasis Achieved: Pressure End Time: 10:36 Response to Treatment: Procedure was tolerated well Level of Consciousness Awake and Alert (Post-procedure): Post Debridement Measurements of Total Wound Length: (cm) 0.9 Width: (cm) 0.7 Depth: (cm) 0.3 Volume: (cm) 0.148 Character of Wound/Ulcer Post Debridement: Stable Severity of Tissue Post Debridement: Fat layer exposed Post Procedure Diagnosis Same as Pre-procedure Electronic Signature(s) Signed: 04/04/2018 4:09:45 PM By: Linton Ham MD Signed: 04/04/2018 4:45:51 PM By: Gretta Cool, BSN, RN, CWS, Kim RN, BSN Entered By: Linton Ham on 04/04/2018 10:52:21 [name] [name], [name] [name] ([id number]) -------------------------------------------------------------------------------- HPI Details Patient Name: [name] [name] Date of Service: 04/04/2018 10:15 AM Medical Record Number: [id number] Patient  Account Number: [id number] Date of Birth/Sex: Mar 23, 1961 (57 y.o. M) Treating RN: Cornell Barman Primary Care Provider: Soyla Dryer Other Clinician: Referring Provider: Soyla Dryer Treating Provider/Extender: Tito Dine in Treatment: 10 History of Present Illness HPI Description: 01/24/18-He is seen in initial evaluation for right great toe ulcer. He was sent to the emergency room on 718 from the nurse clinic office for new-onset diabetes with an A1c of 12.9. He was discharged from the emergency room on metformin, had a right foot x-ray that was negative for osteomyelitis and had a follow-up with his primary care at the free clinic on 7/22. He has been treated twice with antibiotic therapy; bactrim (7/8) and doxycycline (7/18). He is neuropathic. He tolerated debridement; bone is palpable through a thin layer of tissue; there is, what appears to be, resolving erythema and edema; a culture was taken, antibiotics will be prescribed as needed based on sensitivities. 01/31/18; this is a patient with new onset type 2 diabetes who is on oral agents. Culture from last time showed a few Klebsiella and a few enterococcus is being appropriately treated with a combination of Augmentin and ciprofloxacin for 10 days which I think he just started yesterday. He has to get his medications through the free clinic in [place]. He has no insurance. Been using Prisma to the wound. Previous x-ray last month was negative for osteomyelitis.his ABIs look reasonable. He has an offloading sandal 02/07/18; he completes his antibiotics which are Augmentin and ciprofloxacin on Saturday. Using silver collagen. Once again he has callus around the wound and not a very viable looking wound surface. We have been giving him an offloading sandal but I think he is going to require more offloading than that. Previous x-ray was negative. ABIs in our clinic quite reasonable 02/14/18; he arrives today with the wound  dimensions slightly smaller. Still callus nonviable subcutaneous tissue around the wound circumference. He is still in a offloading shoe rather than the Darco. I transitioned him into the Darco today 02/21/18; slight improvement in dimensions. Still aggressive debridements using  Prisma. He is using a Darco forefoot offloading boot 02/28/18; not much improvement. Still requiring aggressive debridement using Prisma. I changed him to Iodoflex to help with the wound debridement. He is using a Darco forefoot offloading boot but tells me he had 1 fall this week. There is not an option for more aggressive offloading. x-ray I ordered last week was negative for osteomyelitis 03/07/18; not much improvement. He arrives with thick skin subcutaneous tissue overhanging the wound covered and callus. This requires debridement. Post debridement the wound surface looks pretty good. As usual have thoughts about of total contact cast but after talking to the patient and his mother and observing the patient leave the clinic there is absolutely no way he could tolerate a total contact cast. He is barely able to maintain balance and the Darco forefoot boot. I suspect this is secondary to diabetic neuropathy 03/14/18; better looking wound today. Plantar aspect of the right great toe. Still requires debridement but less adherent callus nonviable tissue and less debris over the wound surface. He is tolerating the Darco forefoot boot marginally, not a candidate for a TCC. 03/21/18; wound looks a lot better. Still some depth although the base of the wound looks healthy. He still has a fair amount or raised callus which requires debridement. He is tolerating the Darco forefoot boot. He is not a candidate for a TCC secondary to gait imbalance issues 04/04/18; Patient's wound looked a lot better 2 weeks ago. This week necrotic debris over the surface with considerable undermining. He's been using silver collagen. Our intake nurse  picked up that he may not been using the Darco forefoot off loader secondary to balance issues. We put him back in a surgical shoe today Electronic Signature(s) Signed: 04/04/2018 4:09:45 PM By: Linton Ham MD Entered By: Linton Ham on 04/04/2018 10:56:24 [name] [name], [name] [name] ([id number]) -------------------------------------------------------------------------------- Physical Exam Details Patient Name: [name] [name] Date of Service: 04/04/2018 10:15 AM Medical Record Number: [id number] Patient Account Number: [id number] Date of Birth/Sex: [birth year] (57 y.o. M) Treating RN: Cornell Barman Primary Care Provider: Soyla Dryer Other Clinician: Referring Provider: Soyla Dryer Treating Provider/Extender: Tito Dine in Treatment: 10 Constitutional Sitting or standing Blood Pressure is within target range for patient.. Pulse regular and within target range for patient.Marland Kitchen Respirations regular, non-labored and within target range.. Temperature is normal and within the target range for the patient.Marland Kitchen appears in no distress. Notes Wound exam; not a viable surface #3 curet removing necrotic debris overhanging skin and subcutaneous tissue. Post debridement there is still undermining from 9 to 1:00 a 0.3 cm. The base of this does not look too bad Electronic Signature(s) Signed: 04/04/2018 4:09:45 PM By: Linton Ham MD Entered By: Linton Ham on 04/04/2018 10:59:53 [place], [place] ([id number]) -------------------------------------------------------------------------------- Physician Orders Details Patient Name: [name] [name] Date of Service: 04/04/2018 10:15 AM Medical Record Number: [id number] Patient Account Number: [id number] Date of Birth/Sex: 07-Jun-1961 (57 y.o. M) Treating RN: Cornell Barman Primary Care Provider: Soyla Dryer Other Clinician: Referring Provider: Soyla Dryer Treating Provider/Extender: Tito Dine in Treatment: 10 Verbal / Phone  Orders: No Diagnosis Coding Wound Cleansing Wound #1 Right,Plantar Toe Great o Clean wound with Normal Saline. Anesthetic (add to Medication List) Wound #1 Right,Plantar Toe Great o Topical Lidocaine 4% cream applied to wound bed prior to debridement (In Clinic Only). Primary Wound Dressing Wound #1 Right,Plantar Toe Great o Other: - Endoform Secondary Dressing Wound #1 Right,Plantar Toe Great o Gauze and Kerlix/Conform Dressing Change Frequency Wound #1 Right,Plantar Toe Great   o Change Dressing Monday, Wednesday, Friday Follow-up Appointments Wound #1 Right,Plantar Toe Great o Return Appointment in 1 week. Off-Loading Wound #1 Right,Plantar Toe Great o Open toe surgical shoe to: Banker Orders / Instructions Wound #1 Right,Plantar Toe Great o Increase protein intake. Electronic Signature(s) Signed: 04/04/2018 4:09:45 PM By: Linton Ham MD Signed: 04/04/2018 4:45:51 PM By: Gretta Cool BSN, RN, CWS, Kim RN, BSN Entered By: Gretta Cool, BSN, RN, CWS, Kim on 04/04/2018 10:37:35 [name] [name], [name] [name] ([id number]) -------------------------------------------------------------------------------- Problem List Details Patient Name: [name] [name], [name] [name] Date of Service: 04/04/2018 10:15 AM Medical Record Number: [id number] Patient Account Number: [id number] Date of Birth/Sex: November 18, 1960 (57 y.o. 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 M) Treating RN: Cornell Barman Primary Care Provider: Soyla Dryer Other Clinician: Referring Provider: Soyla Dryer Treating Provider/Extender: Tito Dine in Treatment: 10 Subjective History of Present Illness (HPI) 01/24/18-He is seen in initial evaluation for right great toe ulcer. He was sent to the emergency room on 718 from the nurse clinic office for new-onset diabetes with an A1c of 12.9. He was discharged from the emergency room on metformin, had a right foot x-ray that was negative for osteomyelitis and had a follow-up with his primary care at the free clinic on 7/22. He has been treated twice with antibiotic therapy; bactrim (7/8) and doxycycline (7/18). He is neuropathic. He tolerated debridement; bone is palpable through a thin layer of tissue; there is, what appears to be, resolving erythema and edema; a culture was taken, antibiotics will be prescribed as needed based on sensitivities. 01/31/18; this is a patient with new onset type 2 diabetes who is on oral agents. Culture from last time showed a few Klebsiella and a few enterococcus is being appropriately treated with a combination of Augmentin and ciprofloxacin for 10 days which I think he just started yesterday. He has to get his medications through the free clinic in [place]. He has no insurance. Been using Prisma to the wound. Previous x-ray last month was negative for osteomyelitis.his ABIs look reasonable. He has an offloading sandal 02/07/18; he completes his  antibiotics which are Augmentin and ciprofloxacin on Saturday. Using silver collagen. Once again he has callus around the wound and not a very viable looking wound surface. We have been giving him an offloading sandal but I think he is going to require more offloading than that. Previous x-ray was negative. ABIs in our clinic quite reasonable 02/14/18; he arrives today with the wound dimensions slightly smaller. Still callus nonviable subcutaneous tissue around the wound circumference. He is still in a offloading shoe rather than the Darco. I transitioned him into the Darco today 02/21/18; slight improvement in dimensions. Still aggressive debridements using Prisma. He is using a Darco forefoot offloading boot 02/28/18; not much improvement. Still requiring aggressive debridement using  Prisma. I changed him to Iodoflex to help with the wound debridement. He is using a Darco forefoot offloading boot but tells me he had 1 fall this week. There is not an option for more aggressive offloading. x-ray I ordered last week was negative for osteomyelitis 03/07/18; not much improvement. He arrives with thick skin subcutaneous tissue overhanging the wound covered and callus. This requires debridement. Post debridement the wound surface looks pretty good. As usual have thoughts about of total contact cast but after talking to the patient and his mother and observing the patient leave the clinic there is absolutely no way he could tolerate a total contact cast. He is barely able to maintain balance and the Darco forefoot boot. I suspect this is secondary to diabetic neuropathy 03/14/18; better looking wound today. Plantar aspect of the right great toe. Still requires debridement but less adherent callus nonviable tissue and less debris over the wound surface. He is tolerating the Darco forefoot boot marginally, not a candidate for a TCC. 03/21/18; wound looks a lot better. Still some depth although the base of the  wound looks healthy. He still has a fair amount or raised callus which requires debridement. He is tolerating the Darco forefoot boot. He is not a candidate for a TCC secondary to gait imbalance issues 04/04/18; Patient's wound looked a lot better 2 weeks ago. This week necrotic debris over the surface with considerable undermining. He's been using silver collagen. Our intake nurse picked up that he may not been using the Darco forefoot off loader secondary to balance issues. We put him back in a surgical shoe today Objective [name] [name], [name] [name] ([id number]) Constitutional Sitting or standing Blood Pressure is within target range for patient.. Pulse regular and within target range for patient.Marland Kitchen Respirations regular, non-labored and within target range.. Temperature is normal and within the target range for the patient.Marland Kitchen appears in no distress. Vitals Time Taken: 10:13 AM, Height: 70 in, Weight: 139 lbs, BMI: 19.9, Temperature: 98.1 F, Pulse: 95 bpm, Respiratory Rate: 18 breaths/min, Blood Pressure: 115/75 mmHg. General Notes: Wound exam; not a viable surface #3 curet removing necrotic debris overhanging skin and subcutaneous tissue. Post debridement there is still undermining from 9 to 1:00 a 0.3 cm. The base of this does not look too bad Integumentary (Hair, Skin) Wound #1 status is Open. Original cause of wound was Gradually Appeared. The wound is located on the [employer]. The wound measures 0.7cm length x 0.6cm width x 0.3cm depth; 0.33cm^2 area and 0.099cm^3 volume. There is Fat Layer (Subcutaneous Tissue) Exposed exposed. There is no tunneling noted, however, there is undermining starting at 9:00 and ending at 1:00 with a maximum distance of 0.3cm. There is a medium amount of serosanguineous drainage noted. The wound margin is distinct with the outline attached to the wound base. There is no granulation within the wound bed. There is a small (1-33%) amount of necrotic tissue  within the wound bed including Adherent Slough. The periwound skin appearance exhibited: Callus, Maceration. The periwound skin appearance did not exhibit: Crepitus, Excoriation, Induration, Rash, Scarring, Dry/Scaly, Atrophie Blanche, Cyanosis, Ecchymosis, Hemosiderin Staining, Mottled, Pallor, Rubor, Erythema. Periwound temperature was noted as No Abnormality. Assessment Active Problems ICD-10 Type 2 diabetes mellitus with foot ulcer Non-pressure chronic ulcer of other part of right foot with necrosis of muscle Type 2 diabetes mellitus with diabetic neuropathy, unspecified Procedures Wound #1 Pre-procedure diagnosis of Wound #1 is a Diabetic Wound/Ulcer of the Lower Extremity located on the Right,Plantar Toe Great .Severity  of Tissue Pre Debridement is: Fat layer exposed. There was a Excisional Skin/Subcutaneous Tissue Debridement with a total area of 0.42 sq cm performed by Ricard Dillon, MD. With the following instrument(s): Curette to remove Viable and Non-Viable tissue/material. Material removed includes Callus and Subcutaneous Tissue and after achieving pain control using Lidocaine. No specimens were taken. A time out was conducted at 10:30, prior to the start of the procedure. A Moderate amount of bleeding was controlled with Pressure. The procedure was tolerated well. Post Debridement Measurements: 0.9cm length x 0.7cm width x 0.3cm depth; 0.148cm^3 volume. Character of Wound/Ulcer Post Debridement is stable. Severity of Tissue Post Debridement is: Fat layer exposed. Post procedure Diagnosis Wound #1: Same as Pre-Procedure [place], [place] ([id number]) Plan Wound Cleansing: Wound #1 Right,Plantar Toe Great: Clean wound with Normal Saline. Anesthetic (add to Medication List): Wound #1 Right,Plantar Toe Great: Topical Lidocaine 4% cream applied to wound bed prior to debridement (In Clinic Only). Primary Wound Dressing: Wound #1 Right,Plantar Toe Great: Other: -  Endoform Secondary Dressing: Wound #1 Right,Plantar Toe Great: Gauze and Kerlix/Conform Dressing Change Frequency: Wound #1 Right,Plantar Toe Great: Change Dressing Monday, Wednesday, Friday Follow-up Appointments: Wound #1 Right,Plantar Toe Great: Return Appointment in 1 week. Off-Loading: Wound #1 Right,Plantar Toe Great: Open toe surgical shoe to: Banker Orders / Instructions: Wound #1 Right,Plantar Toe Great: Increase protein intake. #1 I change a primary dressing the Endoform #2 as discussed earlier he may not be using the Darco forefoot boot at all. He's previously been in a healing sandal and we put him back in this. Total contact cast is not an option Electronic Signature(s) Signed: 04/04/2018 4:09:45 PM By: Linton Ham MD Entered By: Linton Ham on 04/04/2018 11:00:57 [place], [place] ([id number]) -------------------------------------------------------------------------------- SuperBill Details Patient Name: [name] [name] Date of Service: 04/04/2018 Medical Record Number: [id number] Patient Account Number: [id number] Date of Birth/Sex: 08-30-1960 (57 y.o. M) Treating RN: Cornell Barman Primary Care Provider: Soyla Dryer Other Clinician: Referring Provider: Soyla Dryer Treating Provider/Extender: Tito Dine in Treatment: 10 Diagnosis Coding ICD-10 Codes Code Description E11.621 Type 2 diabetes mellitus with foot ulcer L97.513 Non-pressure chronic ulcer of other part of right foot with necrosis of muscle E11.40 Type 2 diabetes mellitus with diabetic neuropathy, unspecified Facility Procedures CPT4 Code Description: [id number] 11042 - DEB SUBQ TISSUE 20 SQ CM/< ICD-10 Diagnosis Description L97.513 Non-pressure chronic ulcer of other part of right foot with necr E11.621 Type 2 diabetes mellitus with foot ulcer Modifier: osis of muscle Quantity: 1 Physician Procedures CPT4 Code Description: [id number] - WC PHYS SUBQ TISS 20  SQ CM ICD-10 Diagnosis Description L97.513 Non-pressure chronic ulcer of other part of right foot with necr E11.621 Type 2 diabetes mellitus with foot ulcer Modifier: osis of muscle Quantity: 1 Electronic Signature(s) Signed: 04/04/2018 4:09:45 PM By: Linton Ham MD Entered By: Linton Ham on 04/04/2018 11:01:21

## 2018-04-06 NOTE — Progress Notes (Signed)
DUGAN, VANHOESEN ([id number]) Visit Report for 04/04/2018 Arrival Information Details Patient Name: [name] [name], [name] [name] Date of Service: 04/04/2018 10:15 AM Medical Record Number: [id number] Patient Account Number: [id number] Date of Birth/Sex: [birth year] (57 y.o. M) Treating RN: Cornell Barman Primary Care Consetta Cosner: Soyla Dryer Other Clinician: Referring Claudia Greenley: Soyla Dryer Treating Kayte Borchard/Extender: Tito Dine in Treatment: 10 Visit Information History Since Last Visit Added or deleted any medications: No Patient Arrived: Ambulatory Any new allergies or adverse reactions: No Arrival Time: 10:12 Had a fall or experienced change in No Accompanied By: mother activities of daily living that may affect Transfer Assistance: None risk of falls: Patient Identification Verified: Yes Signs or symptoms of abuse/neglect since last No Secondary Verification Process Completed: Yes visito Hospitalized since last visit: No Implantable device outside of the clinic No excluding cellular tissue based products placed in the center since last visit: Has Dressing in Place as Prescribed: Yes Has Footwear/Offloading in Place as Yes Prescribed: Right: Surgical Shoe with Pressure Relief Insole Pain Present Now: Yes Electronic Signature(s) Signed: 04/04/2018 3:20:57 PM By: Lorine Bears RCP, RRT, CHT Entered By: Lorine Bears on 04/04/2018 10:13:36 [place], Dontae ([id number]) -------------------------------------------------------------------------------- Encounter Discharge Information Details Patient Name: [name] [name] Date of Service: 04/04/2018 10:15 AM Medical Record Number: [id number] Patient Account Number: [id number] Date of Birth/Sex: 1961/01/04 (57 y.o. M) Treating RN: Cornell Barman Primary Care Sunshyne Horvath: Soyla Dryer Other Clinician: Referring Kamali Sakata: Soyla Dryer Treating Paeton Latouche/Extender: Tito Dine in Treatment:  10 Encounter Discharge Information Items Discharge Condition: Stable Ambulatory Status: Ambulatory Discharge Destination: Home Transportation: Private Auto Accompanied By: mom Schedule Follow-up Appointment: Yes Clinical Summary of Care: Post Procedure Vitals: Temperature (F): 98.1 Pulse (bpm): 95 Respiratory Rate (breaths/min): 16 Blood Pressure (mmHg): 115/75 Electronic Signature(s) Signed: 04/04/2018 4:45:51 PM By: Gretta Cool, BSN, RN, CWS, Kim RN, BSN Entered By: Gretta Cool, BSN, RN, CWS, Kim on 04/04/2018 10:47:33 [place], Tigran ([id number]) -------------------------------------------------------------------------------- Lower Extremity Assessment Details Patient Name: [name] [name] Date of Service: 04/04/2018 10:15 AM Medical Record Number: [id number] Patient Account Number: [id number] Date of Birth/Sex: July 15, 1960 (57 y.o. M) Treating RN: Secundino Ginger Primary Care Wilgus Deyton: Soyla Dryer Other Clinician: Referring Kallan Bischoff: Soyla Dryer Treating Malaia Buchta/Extender: Ricard Dillon Weeks in Treatment: 10 Electronic Signature(s) Signed: 04/04/2018 4:05:05 PM By: Secundino Ginger Entered By: Secundino Ginger on 04/04/2018 10:23:16 [place], Gurkaran ([id number]) -------------------------------------------------------------------------------- Multi Wound Chart Details Patient Name: [name] [name] Date of Service: 04/04/2018 10:15 AM Medical Record Number: [id number] Patient Account Number: [id number] Date of Birth/Sex: [birth year] (57 y.o. M) Treating RN: Cornell Barman Primary Care Lavonda Thal: Soyla Dryer Other Clinician: Referring Lindsi Bayliss: Soyla Dryer Treating Cheyanne Lamison/Extender: Tito Dine in Treatment: 10 Vital Signs Height(in): 70 Pulse(bpm): 95 Weight(lbs): 139 Blood Pressure(mmHg): 115/75 Body Mass Index(BMI): 20 Temperature(F): 98.1 Respiratory Rate 18 (breaths/min): Photos: [N/A:N/A] Wound Location: Right Toe Great - Plantar N/A N/A Wounding Event:  Gradually Appeared N/A N/A Primary Etiology: Diabetic Wound/Ulcer of the N/A N/A Lower Extremity Comorbid History: Cataracts, Hypertension, Type N/A N/A II Diabetes, Rheumatoid Arthritis, Neuropathy Date Acquired: 12/24/2017 N/A N/A Weeks of Treatment: 10 N/A N/A Wound Status: Open N/A N/A Measurements L x W x D 0.7x0.6x0.3 N/A N/A (cm) Area (cm) : 0.33 N/A N/A Volume (cm) : 0.099 N/A N/A % Reduction in Area: 40.00% N/A N/A % Reduction in Volume: 70.00% N/A N/A Starting Position 1 9 (o'clock): Ending Position 1 1 (o'clock): Maximum Distance 1 (cm): 0.3 Undermining: Yes N/A N/A Classification: Grade 2 N/A N/A Exudate Amount: Medium N/A N/A Exudate Type: Serosanguineous N/A N/A Exudate  Color: red, Bernet N/A N/A Wound Margin: Distinct, outline attached N/A N/A Granulation Amount: None Present (0%) N/A N/A Necrotic Amount: Small (1-33%) N/A N/A Langenberg, Urbano ([id number]) Exposed Structures: Fat Layer (Subcutaneous N/A N/A Tissue) Exposed: Yes Fascia: No Tendon: No Muscle: No Joint: No Bone: No Epithelialization: None N/A N/A Debridement: Debridement - Excisional N/A N/A Pre-procedure 10:30 N/A N/A Verification/Time Out Taken: Pain Control: Lidocaine N/A N/A Tissue Debrided: Callus, Subcutaneous N/A N/A Level: Skin/Subcutaneous Tissue N/A N/A Debridement Area (sq cm): 0.42 N/A N/A Instrument: Curette N/A N/A Bleeding: Moderate N/A N/A Hemostasis Achieved: Pressure N/A N/A Debridement Treatment Procedure was tolerated well N/A N/A Response: Post Debridement 0.9x0.7x0.3 N/A N/A Measurements L x W x D (cm) Post Debridement Volume: 0.148 N/A N/A (cm) Periwound Skin Texture: Callus: Yes N/A N/A Excoriation: No Induration: No Crepitus: No Rash: No Scarring: No Periwound Skin Moisture: Maceration: Yes N/A N/A Dry/Scaly: No Periwound Skin Color: Atrophie Blanche: No N/A N/A Cyanosis: No Ecchymosis: No Erythema: No Hemosiderin Staining: No Mottled:  No Pallor: No Rubor: No Temperature: No Abnormality N/A N/A Tenderness on Palpation: No N/A N/A Wound Preparation: Ulcer Cleansing: N/A N/A Rinsed/Irrigated with Saline Topical Anesthetic Applied: Other: lidocaine 4% Procedures Performed: Debridement N/A N/A Treatment Notes Wound #1 (Right, Plantar Toe Great) Notes Endoform, foam, secured with conform and tape, darco front offloader [place], Taran ([id number]) Electronic Signature(s) Signed: 04/04/2018 4:09:45 PM By: Linton Ham MD Entered By: Linton Ham on 04/04/2018 10:52:10 [place], Traycen ([id number]) -------------------------------------------------------------------------------- [place] Details Patient Name: [name] [name] Date of Service: 04/04/2018 10:15 AM Medical Record Number: [id number] Patient Account Number: [id number] Date of Birth/Sex: 11-04-1960 (57 y.o. M) Treating RN: Cornell Barman Primary Care Ahren Pettinger: Soyla Dryer Other Clinician: Referring Adren Dollins: Soyla Dryer Treating Aeriana Speece/Extender: Tito Dine in Treatment: 10 Active Inactive ` Necrotic Tissue Nursing Diagnoses: Knowledge deficit related to management of necrotic/devitalized tissue Goals: Necrotic/devitalized tissue will be minimized in the wound bed Date Initiated: 01/24/2018 Target Resolution Date: 02/09/2018 Goal Status: Active Interventions: Assess patient pain level pre-, during and post procedure and prior to discharge Treatment Activities: Excisional debridement : 01/24/2018 Notes: ` Orientation to the Wound Care Program Nursing Diagnoses: Knowledge deficit related to the wound healing center program Goals: Patient/caregiver will verbalize understanding of the [place] Date Initiated: 01/24/2018 Target Resolution Date: 02/09/2018 Goal Status: Active Interventions: Provide education on orientation to the wound center Notes: ` Pressure Nursing Diagnoses: Knowledge  deficit related to management of pressures ulcers Goals: Patient will remain free from development of additional pressure ulcers Date Initiated: 01/24/2018 Target Resolution Date: 02/09/2018 BRENNAN, LITZINGER ([id number]) Goal Status: Active Interventions: Assess potential for pressure ulcer upon admission and as needed Notes: ` Wound/Skin Impairment Nursing Diagnoses: Knowledge deficit related to ulceration/compromised skin integrity Goals: Ulcer/skin breakdown will have a volume reduction of 30% by week 4 Date Initiated: 01/24/2018 Target Resolution Date: 02/24/2018 Goal Status: Active Interventions: Assess ulceration(s) every visit Treatment Activities: Topical wound management initiated : 01/24/2018 Notes: Electronic Signature(s) Signed: 04/04/2018 4:45:51 PM By: Gretta Cool, BSN, RN, CWS, Kim RN, BSN Entered By: Gretta Cool, BSN, RN, CWS, Kim on 04/04/2018 10:30:32 [place], Beaman ([id number]) -------------------------------------------------------------------------------- Pain Assessment Details Patient Name: [name] [name] Date of Service: 04/04/2018 10:15 AM Medical Record Number: [id number] Patient Account Number: [id number] Date of Birth/Sex: 02/06/1961 (57 y.o. M) Treating RN: Cornell Barman Primary Care Brealyn Baril: Soyla Dryer Other Clinician: Referring Shelbie Franken: Soyla Dryer Treating Oleda Borski/Extender: Tito Dine in Treatment: 10 Active Problems Location of Pain Severity and Description of Pain Patient  Has Paino Yes Site Locations Rate the pain. Current Pain Level: 2 Pain Management and Medication Current Pain Management: Electronic Signature(s) Signed: 04/04/2018 3:20:57 PM By: Lorine Bears RCP, RRT, CHT Signed: 04/04/2018 4:45:51 PM By: Gretta Cool, BSN, RN, CWS, Kim RN, BSN Entered By: Lorine Bears on 04/04/2018 10:13:46 [place], Yoshua  ([id number]) -------------------------------------------------------------------------------- Patient/Caregiver Education Details Patient Name: [name] [name] Date of Service: 04/04/2018 10:15 AM Medical Record Number: [id number] Patient Account Number: [id number] Date of Birth/Gender: March 14, 1961 (57 y.o. M) Treating RN: Cornell Barman Primary Care Physician: Soyla Dryer Other Clinician: Referring Physician: Soyla Dryer Treating Physician/Extender: Tito Dine in Treatment: 10 Education Assessment Education Provided To: Patient Education Topics Provided Wound/Skin Impairment: Handouts: Caring for Your Ulcer Methods: Demonstration, Explain/Verbal Responses: State content correctly Electronic Signature(s) Signed: 04/04/2018 4:45:51 PM By: Gretta Cool, BSN, RN, CWS, Kim RN, BSN Entered By: Gretta Cool, BSN, RN, CWS, Kim on 04/04/2018 10:38:34 [place], [name] ([id number]) -------------------------------------------------------------------------------- Wound Assessment Details Patient Name: [name] [name] Date of Service: 04/04/2018 10:15 AM Medical Record Number: [id number] Patient Account Number: [id number] Date of Birth/Sex: [birth year] (57 y.o. M) Treating RN: Secundino Ginger Primary Care Ocean Kearley: Soyla Dryer Other Clinician: Referring Albertha Beattie: Soyla Dryer Treating Kewon Statler/Extender: Tito Dine in Treatment: 10 Wound Status Wound Number: 1 Primary Diabetic Wound/Ulcer of the Lower Extremity Etiology: Wound Location: Right Toe Great - Plantar Wound Open Wounding Event: Gradually Appeared Status: Date Acquired: 12/24/2017 Comorbid Cataracts, Hypertension, Type II Diabetes, Weeks Of Treatment: 10 History: Rheumatoid Arthritis, Neuropathy Clustered Wound: No Photos Wound Measurements Length: (cm) 0.7 % Reductio Width: (cm) 0.6 % Reductio Depth: (cm) 0.3 Epithelial Area: (cm) 0.33 Tunneling Volume: (cm) 0.099 Undermini Startin Ending Maximum n in  Area: 40% n in Volume: 70% ization: None : No ng: Yes g Position (o'clock): 9 Position (o'clock): 1 Distance: (cm) 0.3 Wound Description Classification: Grade 2 Foul Odor Wound Margin: Distinct, outline attached Slough/Fi Exudate Amount: Medium Exudate Type: Serosanguineous Exudate Color: red, [place] After Cleansing: No brino No Wound Bed Granulation Amount: None Present (0%) Exposed Structure Necrotic Amount: Small (1-33%) Fascia Exposed: No Necrotic Quality: Adherent Slough Fat Layer (Subcutaneous Tissue) Exposed: Yes Tendon Exposed: No Muscle Exposed: No Joint Exposed: No Spare, Tyquan ([id number]) Bone Exposed: No Periwound Skin Texture Texture Color No Abnormalities Noted: No No Abnormalities Noted: No Callus: Yes Atrophie Blanche: No Crepitus: No Cyanosis: No Excoriation: No Ecchymosis: No Induration: No Erythema: No Rash: No Hemosiderin Staining: No Scarring: No Mottled: No Pallor: No Moisture Rubor: No No Abnormalities Noted: No Dry / Scaly: No Temperature / Pain Maceration: Yes Temperature: No Abnormality Wound Preparation Ulcer Cleansing: Rinsed/Irrigated with Saline Topical Anesthetic Applied: Other: lidocaine 4%, Treatment Notes Wound #1 (Right, Plantar Toe Great) Notes Endoform, foam, secured with conform and tape, darco front Dietitian) Signed: 04/04/2018 4:05:05 PM By: Secundino Ginger Signed: 04/04/2018 4:45:51 PM By: Gretta Cool, BSN, RN, CWS, Kim RN, BSN Entered By: Gretta Cool, BSN, RN, CWS, Kim on 04/04/2018 10:33:38 RIAD, WAGLEY ([id number]) -------------------------------------------------------------------------------- Vitals Details Patient Name: [name] [name] Date of Service: 04/04/2018 10:15 AM Medical Record Number: [id number] Patient Account Number: [id number] Date of Birth/Sex: 1960-11-02 (57 y.o. M) Treating RN: Cornell Barman Primary Care Margorie Renner: Soyla Dryer Other Clinician: Referring Brynnlee Cumpian: Soyla Dryer Treating Zya Finkle/Extender: Tito Dine in Treatment: 10 Vital Signs Time Taken: 10:13 Temperature (F): 98.1 Height (in): 70 Pulse (bpm): 95 Weight (lbs): 139 Respiratory Rate (breaths/min): 18 Body Mass Index (BMI): 19.9 Blood Pressure (mmHg): 115/75 Reference Range: 80 - 120 mg / dl Electronic Signature(s)  Signed: 04/04/2018 3:20:57 PM By: Lorine Bears RCP, RRT, CHT Entered By: Lorine Bears on 04/04/2018 10:16:00

## 2018-04-11 ENCOUNTER — Encounter: Payer: Self-pay | Admitting: Internal Medicine

## 2018-04-14 NOTE — Progress Notes (Signed)
EZEL, VALLONE ([id number]) Visit Report for 04/11/2018 Arrival Information Details Patient Name: [name] [name], [name] [name] Date of Service: 04/11/2018 3:00 PM Medical Record Number: [id number] Patient Account Number: [id number] Date of Birth/Sex: 02/03/1961 (57 y.o. M) Treating RN: Cornell Barman Primary Care Jilleen Essner: Soyla Dryer Other Clinician: Referring Rakiyah Esch: Soyla Dryer Treating Veronia Laprise/Extender: Tito Dine in Treatment: 11 Visit Information History Since Last Visit Added or deleted any medications: No Patient Arrived: Ambulatory Any new allergies or adverse reactions: No Arrival Time: 14:49 Had a fall or experienced change in No Accompanied By: wife activities of daily living that may affect Transfer Assistance: None risk of falls: Patient Identification Verified: Yes Signs or symptoms of abuse/neglect since last visito No Secondary Verification Process Completed: Yes Hospitalized since last visit: No Implantable device outside of the clinic excluding No cellular tissue based products placed in the center since last visit: Has Dressing in Place as Prescribed: Yes Pain Present Now: Yes Electronic Signature(s) Signed: 04/11/2018 4:40:44 PM By: Lorine Bears RCP, RRT, CHT Entered By: Lorine Bears on 04/11/2018 14:51:54 [place], [name] [name] ([id number]) -------------------------------------------------------------------------------- Encounter Discharge Information Details Patient Name: [name] [name] Date of Service: 04/11/2018 3:00 PM Medical Record Number: [id number] Patient Account Number: [id number] Date of Birth/Sex: [birth year] (57 y.o. M) Treating RN: Cornell Barman Primary Care Jerami Tammen: Soyla Dryer Other Clinician: Referring Lacrecia Delval: Soyla Dryer Treating Rube Sanchez/Extender: Tito Dine in Treatment: 11 Encounter Discharge Information Items Post Procedure Vitals Discharge Condition: Stable Temperature (F):  97.8 Ambulatory Status: Ambulatory Pulse (bpm): 83 Discharge Destination: Home Respiratory Rate (breaths/min): 16 Transportation: Private Auto Blood Pressure (mmHg): 147/68 Accompanied By: self Schedule Follow-up Appointment: No Clinical Summary of Care: Electronic Signature(s) Signed: 04/12/2018 7:26:08 AM By: Gretta Cool, BSN, RN, CWS, Kim RN, BSN Entered By: Gretta Cool, BSN, RN, CWS, Kim on 04/11/2018 15:37:43 [place], [name] [name] ([id number]) -------------------------------------------------------------------------------- Lower Extremity Assessment Details Patient Name: [name] [name] Date of Service: 04/11/2018 3:00 PM Medical Record Number: [id number] Patient Account Number: [id number] Date of Birth/Sex: August 22, 1960 (57 y.o. M) Treating RN: Secundino Ginger Primary Care Babatunde Seago: Soyla Dryer Other Clinician: Referring Graden Hoshino: Soyla Dryer Treating Elky Funches/Extender: Tito Dine in Treatment: 11 Electronic Signature(s) Signed: 04/11/2018 3:57:54 PM By: Secundino Ginger Entered By: Secundino Ginger on 04/11/2018 15:04:48 [place], [name] [name] ([id number]) -------------------------------------------------------------------------------- Multi Wound Chart Details Patient Name: [name] [name] Date of Service: 04/11/2018 3:00 PM Medical Record Number: [id number] Patient Account Number: [id number] Date of Birth/Sex: [birth year] (57 y.o. M) Treating RN: Cornell Barman Primary Care Gertie Broerman: Soyla Dryer Other Clinician: Referring Lorrine Killilea: Soyla Dryer Treating Penina Reisner/Extender: Tito Dine in Treatment: 11 Vital Signs Height(in): 70 Pulse(bpm): 83 Weight(lbs): 139 Blood Pressure(mmHg): 147/68 Body Mass Index(BMI): 20 Temperature(F): 97.8 Respiratory Rate 16 (breaths/min): Photos: [N/A:N/A] Wound Location: Right Toe Great - Plantar N/A N/A Wounding Event: Gradually Appeared N/A N/A Primary Etiology: Diabetic Wound/Ulcer of the N/A N/A Lower Extremity Comorbid History:  Cataracts, Hypertension, Type N/A N/A II Diabetes, Rheumatoid Arthritis, Neuropathy Date Acquired: 12/24/2017 N/A N/A Weeks of Treatment: 11 N/A N/A Wound Status: Open N/A N/A Measurements L x W x D 0.6x0.5x0.3 N/A N/A (cm) Area (cm) : 0.236 N/A N/A Volume (cm) : 0.071 N/A N/A % Reduction in Area: 57.10% N/A N/A % Reduction in Volume: 78.50% N/A N/A Classification: Grade 2 N/A N/A Exudate Amount: Medium N/A N/A Exudate Type: Serosanguineous N/A N/A Exudate Color: red, Placido N/A N/A Wound Margin: Distinct, outline attached N/A N/A Granulation Amount: None Present (0%) N/A N/A Necrotic Amount: Small (1-33%) N/A N/A Exposed Structures: Fat Layer (Subcutaneous N/A N/A Tissue)  Exposed: Yes Fascia: No Tendon: No Muscle: No Wegner, [name] [name] ([id number]) Joint: No Bone: No Epithelialization: None N/A N/A Debridement: Debridement - Excisional N/A N/A Pre-procedure 15:34 N/A N/A Verification/Time Out Taken: Pain Control: Lidocaine N/A N/A Tissue Debrided: Subcutaneous N/A N/A Level: Skin/Subcutaneous Tissue N/A N/A Debridement Area (sq cm): 0.3 N/A N/A Instrument: Curette N/A N/A Bleeding: Minimum N/A N/A Hemostasis Achieved: Pressure N/A N/A Procedural Pain: 0 N/A N/A Post Procedural Pain: 0 N/A N/A Debridement Treatment Procedure was tolerated well N/A N/A Response: Post Debridement 0.6x0.6x0.3 N/A N/A Measurements L x W x D (cm) Post Debridement Volume: 0.085 N/A N/A (cm) Periwound Skin Texture: Callus: Yes N/A N/A Excoriation: No Induration: No Crepitus: No Rash: No Scarring: No Periwound Skin Moisture: Maceration: No N/A N/A Dry/Scaly: No Periwound Skin Color: Atrophie Blanche: No N/A N/A Cyanosis: No Ecchymosis: No Erythema: No Hemosiderin Staining: No Mottled: No Pallor: No Rubor: No Temperature: No Abnormality N/A N/A Tenderness on Palpation: No N/A N/A Wound Preparation: Ulcer Cleansing: N/A N/A Rinsed/Irrigated with Saline Topical Anesthetic  Applied: Other: lidocaine 4% Procedures Performed: Debridement N/A N/A Treatment Notes Wound #1 (Right, Plantar Toe Great) Notes Endoform, foam, secured with conform and tape, darco front Dietitian) Signed: 04/11/2018 5:32:05 PM By: Linton Ham MD [place], Aldin ([id number]) Entered By: Linton Ham on 04/11/2018 17:11:55 [place], [name] [name] ([id number]) -------------------------------------------------------------------------------- Ione Details Patient Name: [name] [name] Date of Service: 04/11/2018 3:00 PM Medical Record Number: [id number] Patient Account Number: [id number] Date of Birth/Sex: [birth year] (57 y.o. M) Treating RN: Cornell Barman Primary Care Marshell Rieger: Soyla Dryer Other Clinician: Referring Haylyn Halberg: Soyla Dryer Treating Yosgar Demirjian/Extender: Tito Dine in Treatment: 11 Active Inactive ` Necrotic Tissue Nursing Diagnoses: Knowledge deficit related to management of necrotic/devitalized tissue Goals: Necrotic/devitalized tissue will be minimized in the wound bed Date Initiated: 01/24/2018 Target Resolution Date: 02/09/2018 Goal Status: Active Interventions: Assess patient pain level pre-, during and post procedure and prior to discharge Treatment Activities: Excisional debridement : 01/24/2018 Notes: ` Orientation to the Wound Care Program Nursing Diagnoses: Knowledge deficit related to the wound healing center program Goals: Patient/caregiver will verbalize understanding of the [place] Date Initiated: 01/24/2018 Target Resolution Date: 02/09/2018 Goal Status: Active Interventions: Provide education on orientation to the wound center Notes: ` Pressure Nursing Diagnoses: Knowledge deficit related to management of pressures ulcers Goals: Patient will remain free from development of additional pressure ulcers Date Initiated: 01/24/2018 Target Resolution Date:  02/09/2018 EMMERICH, [name] [name] ([id number]) Goal Status: Active Interventions: Assess potential for pressure ulcer upon admission and as needed Notes: ` Wound/Skin Impairment Nursing Diagnoses: Knowledge deficit related to ulceration/compromised skin integrity Goals: Ulcer/skin breakdown will have a volume reduction of 30% by week 4 Date Initiated: 01/24/2018 Target Resolution Date: 02/24/2018 Goal Status: Active Interventions: Assess ulceration(s) every visit Treatment Activities: Topical wound management initiated : 01/24/2018 Notes: Electronic Signature(s) Signed: 04/12/2018 7:26:08 AM By: Gretta Cool, BSN, RN, CWS, Kim RN, BSN Entered By: Gretta Cool, BSN, RN, CWS, Kim on 04/11/2018 15:33:07 [place], [name] [name] ([id number]) -------------------------------------------------------------------------------- Pain Assessment Details Patient Name: [name] [name] Date of Service: 04/11/2018 3:00 PM Medical Record Number: [id number] Patient Account Number: [id number] Date of Birth/Sex: [birth year] (57 y.o. M) Treating RN: Cornell Barman Primary Care Janson Lamar: Soyla Dryer Other Clinician: Referring Bich Mchaney: Soyla Dryer Treating Willoughby Doell/Extender: Tito Dine in Treatment: 11 Active Problems Location of Pain Severity and Description of Pain Patient Has Paino Yes Site Locations Rate the pain. Current Pain Level: 1 Pain Management and Medication Current Pain Management: Electronic Signature(s) Signed: 04/11/2018  4:40:44 PM By: Paulla Fore, RRT, CHT Signed: 04/12/2018 7:26:08 AM By: Gretta Cool, BSN, RN, CWS, Kim RN, BSN Entered By: Lorine Bears on 04/11/2018 14:52:09 [place], [name] [name] ([id number]) -------------------------------------------------------------------------------- Patient/Caregiver Education Details Patient Name: [name] [name], [name] [name] Date of Service: 04/11/2018 3:00 PM Medical Record Number: [id number] Patient Account Number: [id number] Date of  Birth/Gender: 06/23/1960 (57 y.o. M) Treating RN: Cornell Barman Primary Care Physician: Soyla Dryer Other Clinician: Referring Physician: Soyla Dryer Treating Physician/Extender: Tito Dine in Treatment: 11 Education Assessment Education Provided To: Patient Education Topics Provided Wound/Skin Impairment: Handouts: Caring for Your Ulcer Methods: Demonstration, Explain/Verbal Responses: State content correctly Electronic Signature(s) Signed: 04/12/2018 7:26:08 AM By: Gretta Cool, BSN, RN, CWS, Kim RN, BSN Entered By: Gretta Cool, BSN, RN, CWS, Kim on 04/11/2018 15:36:44 [place], [name] [name] ([id number]) -------------------------------------------------------------------------------- Wound Assessment Details Patient Name: [name] [name] Date of Service: 04/11/2018 3:00 PM Medical Record Number: [id number] Patient Account Number: [id number] Date of Birth/Sex: 10-17-1960 (57 y.o. M) Treating RN: Secundino Ginger Primary Care Peityn Payton: Soyla Dryer Other Clinician: Referring Bayley Yarborough: Soyla Dryer Treating Tarus Briski/Extender: Tito Dine in Treatment: 11 Wound Status Wound Number: 1 Primary Diabetic Wound/Ulcer of the Lower Extremity Etiology: Wound Location: Right Toe Great - Plantar Wound Open Wounding Event: Gradually Appeared Status: Date Acquired: 12/24/2017 Comorbid Cataracts, Hypertension, Type II Diabetes, Weeks Of Treatment: 11 History: Rheumatoid Arthritis, Neuropathy Clustered Wound: No Photos Photo Uploaded By: Secundino Ginger on 04/11/2018 15:46:50 Wound Measurements Length: (cm) 0.6 Width: (cm) 0.5 Depth: (cm) 0.3 Area: (cm) 0.236 Volume: (cm) 0.071 % Reduction in Area: 57.1% % Reduction in Volume: 78.5% Epithelialization: None Tunneling: No Undermining: No Wound Description Classification: Grade 2 Foul Odor Wound Margin: Distinct, outline attached Slough/Fi Exudate Amount: Medium Exudate Type: Serosanguineous Exudate Color: red,  [name] [name] After Cleansing: No brino No Wound Bed Granulation Amount: None Present (0%) Exposed Structure Necrotic Amount: Small (1-33%) Fascia Exposed: No Necrotic Quality: Adherent Slough Fat Layer (Subcutaneous Tissue) Exposed: Yes Tendon Exposed: No Muscle Exposed: No Joint Exposed: No Bone Exposed: No Periwound Skin Texture [place], [name] [name] ([id number]) Texture Color No Abnormalities Noted: No No Abnormalities Noted: No Callus: Yes Atrophie Blanche: No Crepitus: No Cyanosis: No Excoriation: No Ecchymosis: No Induration: No Erythema: No Rash: No Hemosiderin Staining: No Scarring: No Mottled: No Pallor: No Moisture Rubor: No No Abnormalities Noted: No Dry / Scaly: No Temperature / Pain Maceration: No Temperature: No Abnormality Wound Preparation Ulcer Cleansing: Rinsed/Irrigated with Saline Topical Anesthetic Applied: Other: lidocaine 4%, Treatment Notes Wound #1 (Right, Plantar Toe Great) Notes Endoform, foam, secured with conform and tape, darco front Dietitian) Signed: 04/11/2018 3:57:54 PM By: Secundino Ginger Entered By: Secundino Ginger on 04/11/2018 15:08:40 Cavazos, Yuvaan ([id number]) -------------------------------------------------------------------------------- Vitals Details Patient Name: [name] [name] Date of Service: 04/11/2018 3:00 PM Medical Record Number: [id number] Patient Account Number: [id number] Date of Birth/Sex: 1960/10/01 (57 y.o. M) Treating RN: Cornell Barman Primary Care Retta Pitcher: Soyla Dryer Other Clinician: Referring Deajah Erkkila: Soyla Dryer Treating Shaylynn Nulty/Extender: Tito Dine in Treatment: 11 Vital Signs Time Taken: 14:50 Temperature (F): 97.8 Height (in): 70 Pulse (bpm): 83 Weight (lbs): 139 Respiratory Rate (breaths/min): 16 Body Mass Index (BMI): 19.9 Blood Pressure (mmHg): 147/68 Reference Range: 80 - 120 mg / dl Electronic Signature(s) Signed: 04/11/2018 4:40:44 PM By: Lorine Bears RCP, RRT, CHT Entered By: Lorine Bears on 04/11/2018 14:54:57

## 2018-04-14 NOTE — Progress Notes (Signed)
[name] [name] ([id number]) Visit Report for 04/11/2018 Debridement Details Patient Name: [name] [name], [name] [name] Date of Service: 04/11/2018 3:00 PM Medical Record Number: [id number] Patient Account Number: [id number] Date of Birth/Sex: 09-22-1960 (57 y.o. M) Treating RN: Cornell Barman Primary Care Provider: Soyla Dryer Other Clinician: Referring Provider: Soyla Dryer Treating Provider/Extender: Tito Dine in Treatment: 11 Debridement Performed for Wound #1 Right,Plantar Toe Great Assessment: Performed By: Physician Ricard Dillon, MD Debridement Type: Debridement Severity of Tissue Pre Fat layer exposed Debridement: Level of Consciousness (Pre- Awake and Alert procedure): Pre-procedure Verification/Time Yes - 15:34 Out Taken: Start Time: 15:35 Pain Control: Lidocaine Total Area Debrided (L x W): 0.6 (cm) x 0.5 (cm) = 0.3 (cm) Tissue and other material Viable, Non-Viable, Subcutaneous, Skin: Dermis , Skin: Epidermis debrided: Level: Skin/Subcutaneous Tissue Debridement Description: Excisional Instrument: Curette Bleeding: Minimum Hemostasis Achieved: Pressure End Time: 15:39 Procedural Pain: 0 Post Procedural Pain: 0 Response to Treatment: Procedure was tolerated well Level of Consciousness Awake and Alert (Post-procedure): Post Debridement Measurements of Total Wound Length: (cm) 0.6 Width: (cm) 0.6 Depth: (cm) 0.3 Volume: (cm) 0.085 Character of Wound/Ulcer Post Debridement: Stable Severity of Tissue Post Debridement: Fat layer exposed Post Procedure Diagnosis Same as Pre-procedure Electronic Signature(s) Signed: 04/11/2018 5:32:05 PM By: Linton Ham MD Signed: 04/12/2018 7:26:08 AM By: Gretta Cool, BSN, RN, CWS, Kim RN, BSN Entered By: Gretta Cool, BSN, RN, CWS, Kim on 04/11/2018 15:35:13 [name] [name] ([id number]) [name] [name] ([id number]) -------------------------------------------------------------------------------- HPI Details Patient  Name: [name] [name] Date of Service: 04/11/2018 3:00 PM Medical Record Number: [id number] Patient Account Number: [id number] Date of Birth/Sex: 07-07-1960 (57 y.o. M) Treating RN: Cornell Barman Primary Care Provider: Soyla Dryer Other Clinician: Referring Provider: Soyla Dryer Treating Provider/Extender: Tito Dine in Treatment: 11 History of Present Illness HPI Description: 01/24/18-He is seen in initial evaluation for right great toe ulcer. He was sent to the emergency room on 718 from the nurse clinic office for new-onset diabetes with an A1c of 12.9. He was discharged from the emergency room on metformin, had a right foot x-ray that was negative for osteomyelitis and had a follow-up with his primary care at the free clinic on 7/22. He has been treated twice with antibiotic therapy; bactrim (7/8) and doxycycline (7/18). He is neuropathic. He tolerated debridement; bone is palpable through a thin layer of tissue; there is, what appears to be, resolving erythema and edema; a culture was taken, antibiotics will be prescribed as needed based on sensitivities. 01/31/18; this is a patient with new onset type 2 diabetes who is on oral agents. Culture from last time showed a few Klebsiella and a few enterococcus is being appropriately treated with a combination of Augmentin and ciprofloxacin for 10 days which I think he just started yesterday. He has to get his medications through the free clinic in [place]. He has no insurance. Been using Prisma to the wound. Previous x-ray last month was negative for osteomyelitis.his ABIs look reasonable. He has an offloading sandal 02/07/18; he completes his antibiotics which are Augmentin and ciprofloxacin on Saturday. Using silver collagen. Once again he has callus around the wound and not a very viable looking wound surface. We have been giving him an offloading sandal but I think he is going to require more offloading than that. Previous  x-ray was negative. ABIs in our clinic quite reasonable 02/14/18; he arrives today with the wound dimensions slightly smaller. Still callus nonviable subcutaneous tissue around the wound circumference. He is still in a offloading shoe rather than the  Darco. I transitioned him into the Darco today 02/21/18; slight improvement in dimensions. Still aggressive debridements using Prisma. He is using a Darco forefoot offloading boot 02/28/18; not much improvement. Still requiring aggressive debridement using Prisma. I changed him to Iodoflex to help with the wound debridement. He is using a Darco forefoot offloading boot but tells me he had 1 fall this week. There is not an option for more aggressive offloading. x-ray I ordered last week was negative for osteomyelitis 03/07/18; not much improvement. He arrives with thick skin subcutaneous tissue overhanging the wound covered and callus. This requires debridement. Post debridement the wound surface looks pretty good. As usual have thoughts about of total contact cast but after talking to the patient and his mother and observing the patient leave the clinic there is absolutely no way he could tolerate a total contact cast. He is barely able to maintain balance and the Darco forefoot boot. I suspect this is secondary to diabetic neuropathy 03/14/18; better looking wound today. Plantar aspect of the right great toe. Still requires debridement but less adherent callus nonviable tissue and less debris over the wound surface. He is tolerating the Darco forefoot boot marginally, not a candidate for a TCC. 03/21/18; wound looks a lot better. Still some depth although the base of the wound looks healthy. He still has a fair amount or raised callus which requires debridement. He is tolerating the Darco forefoot boot. He is not a candidate for a TCC secondary to gait imbalance issues 04/04/18; Patient's wound looked a lot better 2 weeks ago. This week necrotic debris over  the surface with considerable undermining. He's been using silver collagen. Our intake nurse picked up that he may not been using the Darco forefoot off loader secondary to balance issues. We put him back in a surgical shoe today 04/11/18; some improvement in this wound. He still comes in with callus thick skin and subcutaneous tissue over the wound circumference however debriding this today shows it looks better. We put him back in a surgical shoe last week because he had had some falls with a Darco and we were convinced he was using the Darco in any case. We've been using Endoform that we started last week Electronic Signature(s) Signed: 04/11/2018 5:32:05 PM By: Linton Ham MD Entered By: Linton Ham on 04/11/2018 17:13:56 Wells, Manning ([id number]) -------------------------------------------------------------------------------- Physical Exam Details Patient Name: [name] [name] Date of Service: 04/11/2018 3:00 PM Medical Record Number: [id number] Patient Account Number: [id number] Date of Birth/Sex: 12/30/1960 (57 y.o. M) Treating RN: Cornell Barman Primary Care Provider: Soyla Dryer Other Clinician: Referring Provider: Soyla Dryer Treating Provider/Extender: Tito Dine in Treatment: 11 Constitutional Patient is hypertensive.. Pulse regular and within target range for patient.Marland Kitchen Respirations regular, non-labored and within target range.. Temperature is normal and within the target range for the patient.Marland Kitchen appears in no distress. Notes Wound exam; not a viable surface around the wound which I debrided with a #5 curet. Post debridement this cleans up quite nicely he appears to have healthy granulation in the wound measurements actually are improving. I think this is an offloading issue and I have discussed this with them Electronic Signature(s) Signed: 04/11/2018 5:32:05 PM By: Linton Ham MD Entered By: Linton Ham on 04/11/2018 17:15:54 [place],  Izear ([id number]) -------------------------------------------------------------------------------- Physician Orders Details Patient Name: [name] [name] Date of Service: 04/11/2018 3:00 PM Medical Record Number: [id number] Patient Account Number: [id number] Date of Birth/Sex: 08-Sep-1960 (57 y.o. M) Treating RN: Cornell Barman Primary Care Provider: Soyla Dryer Other  Clinician: Referring Provider: Soyla Dryer Treating Provider/Extender: Tito Dine in Treatment: 11 Verbal / Phone Orders: No Diagnosis Coding Wound Cleansing Wound #1 Right,Plantar Toe Great o Clean wound with Normal Saline. Anesthetic (add to Medication List) Wound #1 Right,Plantar Toe Great o Topical Lidocaine 4% cream applied to wound bed prior to debridement (In Clinic Only). Primary Wound Dressing Wound #1 Right,Plantar Toe Great o Other: - Endoform Secondary Dressing Wound #1 Right,Plantar Toe Great o Gauze and Kerlix/Conform Dressing Change Frequency Wound #1 Right,Plantar Toe Great o Change Dressing Monday, Wednesday, Friday Follow-up Appointments Wound #1 Right,Plantar Toe Great o Return Appointment in 1 week. Off-Loading Wound #1 Right,Plantar Toe Great o Open toe surgical shoe with peg assist. Additional Orders / Instructions Wound #1 Right,Plantar Toe Great o Increase protein intake. Electronic Signature(s) Signed: 04/11/2018 5:32:05 PM By: Linton Ham MD Signed: 04/12/2018 7:26:08 AM By: Gretta Cool, BSN, RN, CWS, Kim RN, BSN Entered By: Gretta Cool, BSN, RN, CWS, Kim on 04/11/2018 15:36:03 GERASIMOS, PLOTTS ([id number]) -------------------------------------------------------------------------------- Problem List Details Patient Name: JASIEL, BELISLE Date of Service: 04/11/2018 3:00 PM Medical Record Number: [id number] Patient Account Number: [id number] Date of Birth/Sex: 17-May-1961 (57 y.o. 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 M) Treating RN: Cornell Barman Primary Care Provider: Soyla Dryer Other Clinician: Referring Provider: Soyla Dryer Treating Provider/Extender: Tito Dine in Treatment: 11 Subjective History of Present Illness (HPI) 01/24/18-He is seen in initial evaluation for right great toe ulcer. He was sent to the emergency room on 718 from the nurse clinic office for new-onset diabetes with an A1c of 12.9. He was discharged from the emergency room on metformin, had a right foot x-ray that was negative for osteomyelitis and had a follow-up with his primary care at the free clinic on 7/22. He has been treated twice with antibiotic therapy; bactrim (7/8) and doxycycline (7/18). He is neuropathic. He tolerated debridement; bone is palpable through a thin layer of tissue; there is, what appears to be, resolving erythema and edema; a culture was taken, antibiotics will be prescribed as needed based on sensitivities. 01/31/18; this is a patient with new onset type 2  diabetes who is on oral agents. Culture from last time showed a few Klebsiella and a few enterococcus is being appropriately treated with a combination of Augmentin and ciprofloxacin for 10 days which I think he just started yesterday. He has to get his medications through the free clinic in [place]. He has no insurance. Been using Prisma to the wound. Previous x-ray last month was negative for osteomyelitis.his ABIs look reasonable. He has an offloading sandal 02/07/18; he completes his antibiotics which are Augmentin and ciprofloxacin on Saturday. Using silver collagen. Once again he has callus around the wound and not a very viable looking wound surface. We have been giving him  an offloading sandal but I think he is going to require more offloading than that. Previous x-ray was negative. ABIs in our clinic quite reasonable 02/14/18; he arrives today with the wound dimensions slightly smaller. Still callus nonviable subcutaneous tissue around the wound circumference. He is still in a offloading shoe rather than the Darco. I transitioned him into the Darco today 02/21/18; slight improvement in dimensions. Still aggressive debridements using Prisma. He is using a Darco forefoot offloading boot 02/28/18; not much improvement. Still requiring aggressive debridement using Prisma. I changed him to Iodoflex to help with the wound debridement. He is using a Darco forefoot offloading boot but tells me he had 1 fall this week. There is not an option for more aggressive offloading. x-ray I ordered last week was negative for osteomyelitis 03/07/18; not much improvement. He arrives with thick skin subcutaneous tissue overhanging the wound covered and callus. This requires debridement. Post debridement the wound surface looks pretty good. As usual have thoughts about of total contact cast but after talking to the patient and his mother and observing the patient leave the clinic there is absolutely no way he could  tolerate a total contact cast. He is barely able to maintain balance and the Darco forefoot boot. I suspect this is secondary to diabetic neuropathy 03/14/18; better looking wound today. Plantar aspect of the right great toe. Still requires debridement but less adherent callus nonviable tissue and less debris over the wound surface. He is tolerating the Darco forefoot boot marginally, not a candidate for a TCC. 03/21/18; wound looks a lot better. Still some depth although the base of the wound looks healthy. He still has a fair amount or raised callus which requires debridement. He is tolerating the Darco forefoot boot. He is not a candidate for a TCC secondary to gait imbalance issues 04/04/18; Patient's wound looked a lot better 2 weeks ago. This week necrotic debris over the surface with considerable undermining. He's been using silver collagen. Our intake nurse picked up that he may not been using the Darco forefoot off loader secondary to balance issues. We put him back in a surgical shoe today 04/11/18; some improvement in this wound. He still comes in with callus thick skin and subcutaneous tissue over the wound circumference however debriding this today shows it looks better. We put him back in a surgical shoe last week because he had had some falls with a Darco and we were convinced he was using the Darco in any case. We've been using Endoform that we started last week Edgerton, Barnaby ([id number]) Objective Constitutional Patient is hypertensive.. Pulse regular and within target range for patient.Marland Kitchen Respirations regular, non-labored and within target range.. Temperature is normal and within the target range for the patient.Marland Kitchen appears in no distress. Vitals Time Taken: 2:50 PM, Height: 70 in, Weight: 139 lbs, BMI: 19.9, Temperature: 97.8 F, Pulse: 83 bpm, Respiratory Rate: 16 breaths/min, Blood Pressure: 147/68 mmHg. General Notes: Wound exam; not a viable surface around the wound which I  debrided with a #5 curet. Post debridement this cleans up quite nicely he appears to have healthy granulation in the wound measurements actually are improving. I think this is an offloading issue and I have discussed this with them Integumentary (Hair, Skin) Wound #1 status is Open. Original cause of wound was Gradually Appeared. The wound is located on the [employer]. The wound measures 0.6cm length x 0.5cm width x 0.3cm depth; 0.236cm^2 area and 0.071cm^3 volume. There is Fat Layer (Subcutaneous Tissue)  Exposed exposed. There is no tunneling or undermining noted. There is a medium amount of serosanguineous drainage noted. The wound margin is distinct with the outline attached to the wound base. There is no granulation within the wound bed. There is a small (1-33%) amount of necrotic tissue within the wound bed including Adherent Slough. The periwound skin appearance exhibited: Callus. The periwound skin appearance did not exhibit: Crepitus, Excoriation, Induration, Rash, Scarring, Dry/Scaly, Maceration, Atrophie Blanche, Cyanosis, Ecchymosis, Hemosiderin Staining, Mottled, Pallor, Rubor, Erythema. Periwound temperature was noted as No Abnormality. Assessment Active Problems ICD-10 Type 2 diabetes mellitus with foot ulcer Non-pressure chronic ulcer of other part of right foot with necrosis of muscle Type 2 diabetes mellitus with diabetic neuropathy, unspecified Procedures Wound #1 Pre-procedure diagnosis of Wound #1 is a Diabetic Wound/Ulcer of the Lower Extremity located on the Right,Plantar Toe Great .Severity of Tissue Pre Debridement is: Fat layer exposed. There was a Excisional Skin/Subcutaneous Tissue Debridement with a total area of 0.3 sq cm performed by Ricard Dillon, MD. With the following instrument(s): Curette to remove Viable and Non-Viable tissue/material. Material removed includes Subcutaneous Tissue, Skin: Dermis, and Skin: Epidermis after achieving pain  control using Lidocaine. No specimens were taken. A time out was conducted at 15:34, prior to the start of the procedure. A Minimum amount of bleeding was controlled with Pressure. The procedure was tolerated well with a pain level of 0 throughout and a pain level of 0 following the procedure. Post Debridement Measurements: 0.6cm length x 0.6cm width x 0.3cm depth; 0.085cm^3 volume. Character of Wound/Ulcer Post Debridement is stable. Severity of Tissue Post Debridement is: Fat layer exposed. Post procedure Diagnosis Wound #1: Same as Pre-Procedure Hett, Myka ([id number]) Plan Wound Cleansing: Wound #1 Right,Plantar Toe Great: Clean wound with Normal Saline. Anesthetic (add to Medication List): Wound #1 Right,Plantar Toe Great: Topical Lidocaine 4% cream applied to wound bed prior to debridement (In Clinic Only). Primary Wound Dressing: Wound #1 Right,Plantar Toe Great: Other: - Endoform Secondary Dressing: Wound #1 Right,Plantar Toe Great: Gauze and Kerlix/Conform Dressing Change Frequency: Wound #1 Right,Plantar Toe Great: Change Dressing Monday, Wednesday, Friday Follow-up Appointments: Wound #1 Right,Plantar Toe Great: Return Appointment in 1 week. Off-Loading: Wound #1 Right,Plantar Toe Great: Open toe surgical shoe with peg assist. Additional Orders / Instructions: Wound #1 Right,Plantar Toe Great: Increase protein intake. #1 continue Endoform to the wound area. #2 follow-up in 2 weeks Electronic Signature(s) Signed: 04/11/2018 5:32:05 PM By: Linton Ham MD Entered By: Linton Ham on 04/11/2018 17:16:27 [place], Muhannad ([id number]) -------------------------------------------------------------------------------- [place] Details Patient Name: [name] [name] Date of Service: 04/11/2018 Medical Record Number: [id number] Patient Account Number: [id number] Date of Birth/Sex: 09-19-1960 (57 y.o. M) Treating RN: Cornell Barman Primary Care Provider: Soyla Dryer  Other Clinician: Referring Provider: Soyla Dryer Treating Provider/Extender: Tito Dine in Treatment: 11 Diagnosis Coding ICD-10 Codes Code Description E11.621 Type 2 diabetes mellitus with foot ulcer L97.513 Non-pressure chronic ulcer of other part of right foot with necrosis of muscle E11.40 Type 2 diabetes mellitus with diabetic neuropathy, unspecified Facility Procedures CPT4 Code Description: [id number] 11042 - DEB SUBQ TISSUE 20 SQ CM/< ICD-10 Diagnosis Description L97.513 Non-pressure chronic ulcer of other part of right foot with necr Modifier: osis of muscle Quantity: 1 Physician Procedures CPT4 Code Description: [id number] - WC PHYS SUBQ TISS 20 SQ CM ICD-10 Diagnosis Description L97.513 Non-pressure chronic ulcer of other part of right foot with necr Modifier: osis of muscle Quantity: 1 Electronic Signature(s) Signed: 04/11/2018 5:32:05 PM By: Linton Ham  MD Entered By: Linton Ham on 04/11/2018 17:16:49

## 2018-04-24 ENCOUNTER — Other Ambulatory Visit: Payer: Self-pay | Admitting: Physician Assistant

## 2018-04-25 ENCOUNTER — Encounter: Payer: Self-pay | Admitting: Physician Assistant

## 2018-04-25 ENCOUNTER — Encounter: Payer: Self-pay | Attending: Internal Medicine | Admitting: Internal Medicine

## 2018-04-25 ENCOUNTER — Other Ambulatory Visit: Payer: Self-pay | Admitting: Physician Assistant

## 2018-04-25 DIAGNOSIS — M069 Rheumatoid arthritis, unspecified: Secondary | ICD-10-CM | POA: Insufficient documentation

## 2018-04-25 DIAGNOSIS — L97513 Non-pressure chronic ulcer of other part of right foot with necrosis of muscle: Secondary | ICD-10-CM | POA: Insufficient documentation

## 2018-04-25 DIAGNOSIS — I10 Essential (primary) hypertension: Secondary | ICD-10-CM | POA: Insufficient documentation

## 2018-04-25 DIAGNOSIS — E1140 Type 2 diabetes mellitus with diabetic neuropathy, unspecified: Secondary | ICD-10-CM | POA: Insufficient documentation

## 2018-04-25 DIAGNOSIS — E11621 Type 2 diabetes mellitus with foot ulcer: Secondary | ICD-10-CM | POA: Insufficient documentation

## 2018-04-25 MED ORDER — SITAGLIPTIN PHOSPHATE 100 MG PO TABS: 100.0000 mg | ORAL_TABLET | Freq: Every day | ORAL | 1 refills | Status: DC

## 2018-04-25 NOTE — Progress Notes (Signed)
LPN attempted to get pt referred with [facility] opthamology, but Regino Schultze from [facility] states referral needs to be done by an opthamologist or optamology office.  LPN called Chelsey at [facility] in [place] to notify and give [facility] phone number [phone]. Per Chelsey she will call [facility] opthamology to get pt referral appointment.

## 2018-04-27 NOTE — Progress Notes (Signed)
MISTER, KRAHENBUHL ([id number]) Visit Report for 04/25/2018 Arrival Information Details Patient Name: [name] [name], [name] [name] Date of Service: 04/25/2018 1:45 PM Medical Record Number: [id number] Patient Account Number: [id number] Date of Birth/Sex: 1961/02/08 (57 y.o. M) Treating RN: Secundino Ginger Primary Care Daryn Pisani: Soyla Dryer Other Clinician: Referring Catarina Huntley: Soyla Dryer Treating Blaize Nipper/Extender: Tito Dine in Treatment: 13 Visit Information History Since Last Visit Added or deleted any medications: No Patient Arrived: Ambulatory Any new allergies or adverse reactions: No Arrival Time: 13:54 Had a fall or experienced change in No Accompanied By: wife activities of daily living that may affect Transfer Assistance: None risk of falls: Patient Identification Verified: Yes Signs or symptoms of abuse/neglect since last visito No Secondary Verification Process Completed: Yes Hospitalized since last visit: No Implantable device outside of the clinic excluding No cellular tissue based products placed in the center since last visit: Has Dressing in Place as Prescribed: Yes Pain Present Now: No Electronic Signature(s) Signed: 04/25/2018 4:11:31 PM By: Secundino Ginger Entered By: Secundino Ginger on 04/25/2018 [place], Ardsley ([id number]) -------------------------------------------------------------------------------- Encounter Discharge Information Details Patient Name: [name] [name] Date of Service: 04/25/2018 1:45 PM Medical Record Number: [id number] Patient Account Number: [id number] Date of Birth/Sex: 11/20/1960 (57 y.o. M) Treating RN: Cornell Barman Primary Care Maizey Menendez: Soyla Dryer Other Clinician: Referring Antwuan Eckley: Soyla Dryer Treating Al Bracewell/Extender: Tito Dine in Treatment: 13 Encounter Discharge Information Items Post Procedure Vitals Discharge Condition: Stable Temperature (F): 97.7 Ambulatory Status: Ambulatory Pulse (bpm):  75 Discharge Destination: Home Respiratory Rate (breaths/min): 16 Transportation: Private Auto Blood Pressure (mmHg): 149/67 Accompanied By: mom Schedule Follow-up Appointment: Yes Clinical Summary of Care: Electronic Signature(s) Signed: 04/25/2018 5:34:01 PM By: Gretta Cool, BSN, RN, CWS, Kim RN, BSN Entered By: Gretta Cool, BSN, RN, CWS, Kim on 04/25/2018 14:14:15 [place]ifton, Dontell ([id number]) -------------------------------------------------------------------------------- Lower Extremity Assessment Details Patient Name: [name] [name] Date of Service: 04/25/2018 1:45 PM Medical Record Number: [id number] Patient Account Number: [id number] Date of Birth/Sex: [birth year] (57 y.o. M) Treating RN: Secundino Ginger Primary Care Shalonda Sachse: Soyla Dryer Other Clinician: Referring Zandria Woldt: Soyla Dryer Treating Creed Kail/Extender: Tito Dine in Treatment: 13 Vascular Assessment Claudication: Claudication Assessment [Right:None] Pulses: Dorsalis Pedis Palpable: [Right:Yes] Posterior Tibial Extremity colors, hair growth, and conditions: Extremity Color: [Right:Normal] Hair Growth on Extremity: [Right:No] Temperature of Extremity: [Right:Cold] Capillary Refill: [Right:< 3 seconds] Toe Nail Assessment Left: Right: Thick: Yes Discolored: Yes Deformed: Yes Improper Length and Hygiene: Yes Electronic Signature(s) Signed: 04/25/2018 4:11:31 PM By: Secundino Ginger Entered By: Secundino Ginger on 04/25/2018 14:05:07 Toya, Julias ([id number]) -------------------------------------------------------------------------------- Multi Wound Chart Details Patient Name: [name] [name] Date of Service: 04/25/2018 1:45 PM Medical Record Number: [id number] Patient Account Number: [id number] Date of Birth/Sex: March 25, 1961 (57 y.o. M) Treating RN: Cornell Barman Primary Care Davone Shinault: Soyla Dryer Other Clinician: Referring Lafreda Casebeer: Soyla Dryer Treating Regnald Bowens/Extender: Tito Dine in  Treatment: 13 Vital Signs Height(in): 70 Pulse(bpm): 75 Weight(lbs): 139 Blood Pressure(mmHg): 149/67 Body Mass Index(BMI): 20 Temperature(F): 97.7 Respiratory Rate 16 (breaths/min): Photos: [1:No Photos] [N/A:N/A] Wound Location: [1:Right Toe Great - Plantar] [N/A:N/A] Wounding Event: [1:Gradually Appeared] [N/A:N/A] Primary Etiology: [1:Diabetic Wound/Ulcer of the Lower Extremity] [N/A:N/A] Comorbid History: [1:Cataracts, Hypertension, Type II Diabetes, Rheumatoid Arthritis, Neuropathy] [N/A:N/A] Date Acquired: [1:12/24/2017] [N/A:N/A] Weeks of Treatment: [1:13] [N/A:N/A] Wound Status: [1:Open] [N/A:N/A] Measurements L x W x D [1:0.5x0.4x0.3] [N/A:N/A] (cm) Area (cm) : [1:0.157] [N/A:N/A] Volume (cm) : [1:0.047] [N/A:N/A] % Reduction in Area: [1:71.50%] [N/A:N/A] % Reduction in Volume: [1:85.80%] [N/A:N/A] Classification: [1:Grade 2] [N/A:N/A] Exudate Amount: [1:Medium] [N/A:N/A] Exudate Type: [1:Serous] [N/A:N/A]  Exudate Color: [1:amber] [N/A:N/A] Wound Margin: [1:Distinct, outline attached] [N/A:N/A] Granulation Amount: [1:None Present (0%)] [N/A:N/A] Necrotic Amount: [1:None Present (0%)] [N/A:N/A] Exposed Structures: [1:Fat Layer (Subcutaneous Tissue) Exposed: Yes Fascia: No Tendon: No Muscle: No Joint: No Bone: No] [N/A:N/A] Epithelialization: [1:None] [N/A:N/A] Debridement: [1:Debridement - Excisional] [N/A:N/A] Pre-procedure [1:14:11] [N/A:N/A] Verification/Time Out Taken: Pain Control: [1:Lidocaine] [N/A:N/A] Tissue Debrided: [1:Callus, Subcutaneous] [N/A:N/A] Level: Skin/Subcutaneous Tissue N/A N/A Debridement Area (sq cm): 0.2 N/A N/A Instrument: Curette N/A N/A Bleeding: Minimum N/A N/A Hemostasis Achieved: Pressure N/A N/A Debridement Treatment Procedure was tolerated well N/A N/A Response: Post Debridement 0.5x0.4x0.3 N/A N/A Measurements L x W x D (cm) Post Debridement Volume: 0.047 N/A N/A (cm) Periwound Skin Texture: Callus: Yes N/A  N/A Excoriation: No Induration: No Crepitus: No Rash: No Scarring: No Periwound Skin Moisture: Maceration: No N/A N/A Dry/Scaly: No Periwound Skin Color: Atrophie Blanche: No N/A N/A Cyanosis: No Ecchymosis: No Erythema: No Hemosiderin Staining: No Mottled: No Pallor: No Rubor: No Temperature: No Abnormality N/A N/A Tenderness on Palpation: No N/A N/A Wound Preparation: Ulcer Cleansing: N/A N/A Rinsed/Irrigated with Saline Topical Anesthetic Applied: Other: lidocaine 4% Procedures Performed: Debridement N/A N/A Treatment Notes Wound #1 (Right, Plantar Toe Great) Notes Endoform, foam, secured with conform and tape, darco front Dietitian) Signed: 04/25/2018 5:24:02 PM By: Linton Ham MD Entered By: Linton Ham on 04/25/2018 14:35:15 [place], Tyaire ([id number]) -------------------------------------------------------------------------------- Lydia Details Patient Name: [name] [name] Date of Service: 04/25/2018 1:45 PM Medical Record Number: [id number] Patient Account Number: [id number] Date of Birth/Sex: May 12, 1961 (57 y.o. M) Treating RN: Cornell Barman Primary Care Jaykwon Morones: Soyla Dryer Other Clinician: Referring Shameer Molstad: Soyla Dryer Treating Kortez Murtagh/Extender: Tito Dine in Treatment: 13 Active Inactive ` Necrotic Tissue Nursing Diagnoses: Knowledge deficit related to management of necrotic/devitalized tissue Goals: Necrotic/devitalized tissue will be minimized in the wound bed Date Initiated: 01/24/2018 Target Resolution Date: 02/09/2018 Goal Status: Active Interventions: Assess patient pain level pre-, during and post procedure and prior to discharge Treatment Activities: Excisional debridement : 01/24/2018 Notes: ` Orientation to the Wound Care Program Nursing Diagnoses: Knowledge deficit related to the wound healing center program Goals: Patient/caregiver will verbalize  understanding of the [facility] Date Initiated: 01/24/2018 Target Resolution Date: 02/09/2018 Goal Status: Active Interventions: Provide education on orientation to the wound center Notes: ` Pressure Nursing Diagnoses: Knowledge deficit related to management of pressures ulcers Goals: Patient will remain free from development of additional pressure ulcers Date Initiated: 01/24/2018 Target Resolution Date: 02/09/2018 ANTOWAN, SAMFORD ([id number]) Goal Status: Active Interventions: Assess potential for pressure ulcer upon admission and as needed Notes: ` Wound/Skin Impairment Nursing Diagnoses: Knowledge deficit related to ulceration/compromised skin integrity Goals: Ulcer/skin breakdown will have a volume reduction of 30% by week 4 Date Initiated: 01/24/2018 Target Resolution Date: 02/24/2018 Goal Status: Active Interventions: Assess ulceration(s) every visit Treatment Activities: Topical wound management initiated : 01/24/2018 Notes: Electronic Signature(s) Signed: 04/25/2018 5:34:01 PM By: Gretta Cool, BSN, RN, CWS, Kim RN, BSN Entered By: Gretta Cool, BSN, RN, CWS, Kim on 04/25/2018 14:11:33 [place], [place] ([id number]) -------------------------------------------------------------------------------- Pain Assessment Details Patient Name: [name] [name] Date of Service: 04/25/2018 1:45 PM Medical Record Number: [id number] Patient Account Number: [id number] Date of Birth/Sex: [birth year] (57 y.o. M) Treating RN: Secundino Ginger Primary Care Mayrin Schmuck: Soyla Dryer Other Clinician: Referring Peggi Yono: Soyla Dryer Treating Reegan Bouffard/Extender: Tito Dine in Treatment: 13 Active Problems Location of Pain Severity and Description of Pain Patient Has Paino No Site Locations Pain Management and Medication Current Pain Management: Goals for Pain  Management pt denies any pain at this time. Electronic Signature(s) Signed: 04/25/2018 4:11:31 PM By: Secundino Ginger Entered By: Secundino Ginger on 04/25/2018 13:58:19 [place]atteo, Casy ([id number]) -------------------------------------------------------------------------------- Patient/Caregiver Education Details Patient Name: [name] [name] Date of Service: 04/25/2018 1:45 PM Medical Record Number: [id number] Patient Account Number: [id number] Date of Birth/Gender: 04-Apr-1961 (57 y.o. M) Treating RN: Cornell Barman Primary Care Physician: Soyla Dryer Other Clinician: Referring Physician: Soyla Dryer Treating Physician/Extender: Tito Dine in Treatment: 13 Education Assessment Education Provided To: Patient Education Topics Provided Wound/Skin Impairment: Handouts: Caring for Your Ulcer Methods: Demonstration, Explain/Verbal Responses: State content correctly Electronic Signature(s) Signed: 04/25/2018 5:34:01 PM By: Gretta Cool, BSN, RN, CWS, Kim RN, BSN Entered By: Gretta Cool, BSN, RN, CWS, Kim on 04/25/2018 14:13:30 [place], Cung ([id number]) -------------------------------------------------------------------------------- Wound Assessment Details Patient Name: [name] [name] Date of Service: 04/25/2018 1:45 PM Medical Record Number: [id number] Patient Account Number: [id number] Date of Birth/Sex: September 29, 1960 (57 y.o. M) Treating RN: Secundino Ginger Primary Care Benton Tooker: Soyla Dryer Other Clinician: Referring Keeli Roberg: Soyla Dryer Treating Lisa Milian/Extender: Tito Dine in Treatment: 13 Wound Status Wound Number: 1 Primary Diabetic Wound/Ulcer of the Lower Extremity Etiology: Wound Location: Right Toe Great - Plantar Wound Open Wounding Event: Gradually Appeared Status: Date Acquired: 12/24/2017 Comorbid Cataracts, Hypertension, Type II Diabetes, Weeks Of Treatment: 13 History: Rheumatoid Arthritis, Neuropathy Clustered Wound: No Photos Photo Uploaded By: Secundino Ginger on 04/25/2018 15:58:35 Wound Measurements Length: (cm) 0.5 % Reductio Width: (cm) 0.4 %  Reductio Depth: (cm) 0.3 Epithelial Area: (cm) 0.157 Tunneling Volume: (cm) 0.047 Undermini n in Area: 71.5% n in Volume: 85.8% ization: None : No ng: No Wound Description Classification: Grade 2 Foul Odor Wound Margin: Distinct, outline attached Slough/Fi Exudate Amount: Medium Exudate Type: Serous Exudate Color: amber After Cleansing: No brino No Wound Bed Granulation Amount: None Present (0%) Exposed Structure Necrotic Amount: None Present (0%) Fascia Exposed: No Fat Layer (Subcutaneous Tissue) Exposed: Yes Tendon Exposed: No Muscle Exposed: No Joint Exposed: No Bone Exposed: No Periwound Skin Texture Sipe, Fahad ([id number]) Texture Color No Abnormalities Noted: No No Abnormalities Noted: No Callus: Yes Atrophie Blanche: No Crepitus: No Cyanosis: No Excoriation: No Ecchymosis: No Induration: No Erythema: No Rash: No Hemosiderin Staining: No Scarring: No Mottled: No Pallor: No Moisture Rubor: No No Abnormalities Noted: No Dry / Scaly: No Temperature / Pain Maceration: No Temperature: No Abnormality Wound Preparation Ulcer Cleansing: Rinsed/Irrigated with Saline Topical Anesthetic Applied: Other: lidocaine 4%, Treatment Notes Wound #1 (Right, Plantar Toe Great) Notes Endoform, foam, secured with conform and tape, darco front Dietitian) Signed: 04/25/2018 4:11:31 PM By: Secundino Ginger Entered By: Secundino Ginger on 04/25/2018 14:04:19 [place], Jakaiden ([id number]) -------------------------------------------------------------------------------- Vitals Details Patient Name: [name] [name] Date of Service: 04/25/2018 1:45 PM Medical Record Number: [id number] Patient Account Number: [id number] Date of Birth/Sex: October 06, 1960 (57 y.o. M) Treating RN: Secundino Ginger Primary Care Bryana Froemming: Soyla Dryer Other Clinician: Referring Magdelene Ruark: Soyla Dryer Treating Amie Cowens/Extender: Tito Dine in Treatment: 13 Vital Signs Time  Taken: 13:58 Temperature (F): 97.7 Height (in): 70 Pulse (bpm): 75 Weight (lbs): 139 Respiratory Rate (breaths/min): 16 Body Mass Index (BMI): 19.9 Blood Pressure (mmHg): 149/67 Reference Range: 80 - 120 mg / dl Electronic Signature(s) Signed: 04/25/2018 4:11:31 PM By: Secundino Ginger Entered By: Secundino Ginger on 04/25/2018 13:58:49

## 2018-04-27 NOTE — Progress Notes (Signed)
JAY, HASKEW ([id number]) Visit Report for 04/25/2018 Debridement Details Patient Name: [name] [name], [name] [name] Date of Service: 04/25/2018 1:45 PM Medical Record Number: [id number] Patient Account Number: [id number] Date of Birth/Sex: 29-May-1961 (57 y.o. M) Treating RN: Cornell Barman Primary Care Provider: Soyla Dryer Other Clinician: Referring Provider: Soyla Dryer Treating Provider/Extender: Tito Dine in Treatment: 13 Debridement Performed for Wound #1 Right,Plantar Toe Great Assessment: Performed By: Physician Ricard Dillon, MD Debridement Type: Debridement Severity of Tissue Pre Fat layer exposed Debridement: Level of Consciousness (Pre- Awake and Alert procedure): Pre-procedure Verification/Time Yes - 14:11 Out Taken: Start Time: 14:11 Pain Control: Lidocaine Total Area Debrided (L x W): 0.5 (cm) x 0.4 (cm) = 0.2 (cm) Tissue and other material Viable, Callus, Subcutaneous debrided: Level: Skin/Subcutaneous Tissue Debridement Description: Excisional Instrument: Curette Bleeding: Minimum Hemostasis Achieved: Pressure End Time: 14:12 Response to Treatment: Procedure was tolerated well Level of Consciousness Awake and Alert (Post-procedure): Post Debridement Measurements of Total Wound Length: (cm) 0.5 Width: (cm) 0.4 Depth: (cm) 0.3 Volume: (cm) 0.047 Character of Wound/Ulcer Post Debridement: Requires Further Debridement Severity of Tissue Post Debridement: Fat layer exposed Post Procedure Diagnosis Same as Pre-procedure Electronic Signature(s) Signed: 04/25/2018 5:24:02 PM By: Linton Ham MD Signed: 04/25/2018 5:34:01 PM By: Gretta Cool, BSN, RN, CWS, Kim RN, BSN Entered By: Linton Ham on 04/25/2018 14:35:23 [place], [place] ([id number]) -------------------------------------------------------------------------------- HPI Details Patient Name: [name] [name] Date of Service: 04/25/2018 1:45 PM Medical Record Number: [id number] Patient  Account Number: [id number] Date of Birth/Sex: 24-Apr-1961 (57 y.o. M) Treating RN: Cornell Barman Primary Care Provider: Soyla Dryer Other Clinician: Referring Provider: Soyla Dryer Treating Provider/Extender: Tito Dine in Treatment: 13 History of Present Illness HPI Description: 01/24/18-He is seen in initial evaluation for right great toe ulcer. He was sent to the emergency room on 718 from the nurse clinic office for new-onset diabetes with an A1c of 12.9. He was discharged from the emergency room on metformin, had a right foot x-ray that was negative for osteomyelitis and had a follow-up with his primary care at the free clinic on 7/22. He has been treated twice with antibiotic therapy; bactrim (7/8) and doxycycline (7/18). He is neuropathic. He tolerated debridement; bone is palpable through a thin layer of tissue; there is, what appears to be, resolving erythema and edema; a culture was taken, antibiotics will be prescribed as needed based on sensitivities. 01/31/18; this is a patient with new onset type 2 diabetes who is on oral agents. Culture from last time showed a few Klebsiella and a few enterococcus is being appropriately treated with a combination of Augmentin and ciprofloxacin for 10 days which I think he just started yesterday. He has to get his medications through the free clinic in [place]. He has no insurance. Been using Prisma to the wound. Previous x-ray last month was negative for osteomyelitis.his ABIs look reasonable. He has an offloading sandal 02/07/18; he completes his antibiotics which are Augmentin and ciprofloxacin on Saturday. Using silver collagen. Once again he has callus around the wound and not a very viable looking wound surface. We have been giving him an offloading sandal but I think he is going to require more offloading than that. Previous x-ray was negative. ABIs in our clinic quite reasonable 02/14/18; he arrives today with the wound  dimensions slightly smaller. Still callus nonviable subcutaneous tissue around the wound circumference. He is still in a offloading shoe rather than the Darco. I transitioned him into the Darco today 02/21/18; slight improvement in dimensions. Still aggressive debridements  using Prisma. He is using a Darco forefoot offloading boot 02/28/18; not much improvement. Still requiring aggressive debridement using Prisma. I changed him to Iodoflex to help with the wound debridement. He is using a Darco forefoot offloading boot but tells me he had 1 fall this week. There is not an option for more aggressive offloading. x-ray I ordered last week was negative for osteomyelitis 03/07/18; not much improvement. He arrives with thick skin subcutaneous tissue overhanging the wound covered and callus. This requires debridement. Post debridement the wound surface looks pretty good. As usual have thoughts about of total contact cast but after talking to the patient and his mother and observing the patient leave the clinic there is absolutely no way he could tolerate a total contact cast. He is barely able to maintain balance and the Darco forefoot boot. I suspect this is secondary to diabetic neuropathy 03/14/18; better looking wound today. Plantar aspect of the right great toe. Still requires debridement but less adherent callus nonviable tissue and less debris over the wound surface. He is tolerating the Darco forefoot boot marginally, not a candidate for a TCC. 03/21/18; wound looks a lot better. Still some depth although the base of the wound looks healthy. He still has a fair amount or raised callus which requires debridement. He is tolerating the Darco forefoot boot. He is not a candidate for a TCC secondary to gait imbalance issues 04/04/18; Patient's wound looked a lot better 2 weeks ago. This week necrotic debris over the surface with considerable undermining. He's been using silver collagen. Our intake nurse  picked up that he may not been using the Darco forefoot off loader secondary to balance issues. We put him back in a surgical shoe today 04/11/18; some improvement in this wound. He still comes in with callus thick skin and subcutaneous tissue over the wound circumference however debriding this today shows it looks better. We put him back in a surgical shoe last week because he had had some falls with a Darco and we were convinced he was using the Darco in any case. We've been using Endoform that we started last week 04/25/18. Wound is generally improved. We are using Endoform.He is offloading this better using a surgical shoe. He cannot tolerate any more aggressive offloading secondary to balance issues Electronic Signature(s) Signed: 04/25/2018 5:24:02 PM By: Linton Ham MD Entered By: Linton Ham on 04/25/2018 [place], [place] ([id number]) [place], [place] ([id number]) -------------------------------------------------------------------------------- Physical Exam Details Patient Name: [name] [name] Date of Service: 04/25/2018 1:45 PM Medical Record Number: [id number] Patient Account Number: [id number] Date of Birth/Sex: 1960-10-19 (57 y.o. M) Treating RN: Cornell Barman Primary Care Provider: Soyla Dryer Other Clinician: Referring Provider: Soyla Dryer Treating Provider/Extender: Tito Dine in Treatment: 3 Constitutional Patient is hypertensive.. Pulse regular and within target range for patient.Marland Kitchen Respirations regular, non-labored and within target range.. Temperature is normal and within the target range for the patient.Marland Kitchen appears in no distress. Notes Wound exam; the base of the wound actually looks somewhat better. I removed thick skin and subcutaneous tissue from around the wound circumference is slight amount of debris over the wound surface. Hemostasis with direct pressure this really looks like it's filling in nicely. There is no evidence of  surrounding infection Electronic Signature(s) Signed: 04/25/2018 5:24:02 PM By: Linton Ham MD Entered By: Linton Ham on 04/25/2018 14:37:15 [place], [place] ([id number]) -------------------------------------------------------------------------------- Physician Orders Details Patient Name: [name] [name] Date of Service: 04/25/2018 1:45 PM Medical Record Number: [id number] Patient Account Number: [id number] Date  of Birth/Sex: 1960-07-14 (57 y.o. M) Treating RN: Cornell Barman Primary Care Provider: Soyla Dryer Other Clinician: Referring Provider: Soyla Dryer Treating Provider/Extender: Tito Dine in Treatment: 49 Verbal / Phone Orders: No Diagnosis Coding Wound Cleansing Wound #1 Right,Plantar Toe Great o Clean wound with Normal Saline. Anesthetic (add to Medication List) Wound #1 Right,Plantar Toe Great o Topical Lidocaine 4% cream applied to wound bed prior to debridement (In Clinic Only). Primary Wound Dressing Wound #1 Right,Plantar Toe Great o Other: - Endoform Secondary Dressing Wound #1 Right,Plantar Toe Great o Gauze and Kerlix/Conform Dressing Change Frequency Wound #1 Right,Plantar Toe Great o Change Dressing Monday, Wednesday, Friday Follow-up Appointments Wound #1 Right,Plantar Toe Great o Return Appointment in 1 week. Off-Loading Wound #1 Right,Plantar Toe Great o Open toe surgical shoe with peg assist. Additional Orders / Instructions Wound #1 Right,Plantar Toe Great o Increase protein intake. Electronic Signature(s) Signed: 04/25/2018 5:24:02 PM By: Linton Ham MD Signed: 04/25/2018 5:34:01 PM By: Gretta Cool, BSN, RN, CWS, Kim RN, BSN Entered By: Gretta Cool, BSN, RN, CWS, Kim on 04/25/2018 14:13:09 SHAWNA, KIENER ([id number]) -------------------------------------------------------------------------------- Problem List Details Patient Name: [name] [name] Date of Service: 04/25/2018 1:45 PM Medical Record Number:  [id number] Patient Account Number: [id number] Date of Birth/Sex: 10-22-1960 (57 y.o. 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 M) Treating RN: Cornell Barman Primary Care Provider: Soyla Dryer Other Clinician: Referring Provider: Soyla Dryer Treating Provider/Extender: Tito Dine in Treatment: 13 Subjective History of Present Illness (HPI) 01/24/18-He is seen in initial evaluation for right great toe ulcer. He was sent to the emergency room on 718 from the nurse clinic office for new-onset diabetes with an A1c of 12.9. He was discharged from the emergency room on metformin, had a right foot x-ray that was negative for osteomyelitis and had a follow-up with his primary care at the free clinic on 7/22. He has been treated twice with antibiotic therapy; bactrim (7/8) and doxycycline (7/18). He is neuropathic. He tolerated debridement; bone is palpable through a thin layer of tissue; there is, what appears to be, resolving  erythema and edema; a culture was taken, antibiotics will be prescribed as needed based on sensitivities. 01/31/18; this is a patient with new onset type 2 diabetes who is on oral agents. Culture from last time showed a few Klebsiella and a few enterococcus is being appropriately treated with a combination of Augmentin and ciprofloxacin for 10 days which I think he just started yesterday. He has to get his medications through the free clinic in [place]. He has no insurance. Been using Prisma to the wound. Previous x-ray last month was negative for osteomyelitis.his ABIs look reasonable. He has an offloading sandal 02/07/18; he completes his antibiotics which are Augmentin and ciprofloxacin on Saturday. Using silver collagen. Once again he has callus  around the wound and not a very viable looking wound surface. We have been giving him an offloading sandal but I think he is going to require more offloading than that. Previous x-ray was negative. ABIs in our clinic quite reasonable 02/14/18; he arrives today with the wound dimensions slightly smaller. Still callus nonviable subcutaneous tissue around the wound circumference. He is still in a offloading shoe rather than the Darco. I transitioned him into the Darco today 02/21/18; slight improvement in dimensions. Still aggressive debridements using Prisma. He is using a Darco forefoot offloading boot 02/28/18; not much improvement. Still requiring aggressive debridement using Prisma. I changed him to Iodoflex to help with the wound debridement. He is using a Darco forefoot offloading boot but tells me he had 1 fall this week. There is not an option for more aggressive offloading. x-ray I ordered last week was negative for osteomyelitis 03/07/18; not much improvement. He arrives with thick skin subcutaneous tissue overhanging the wound covered and callus. This requires debridement. Post debridement the wound surface looks pretty good. As usual have thoughts  about of total contact cast but after talking to the patient and his mother and observing the patient leave the clinic there is absolutely no way he could tolerate a total contact cast. He is barely able to maintain balance and the Darco forefoot boot. I suspect this is secondary to diabetic neuropathy 03/14/18; better looking wound today. Plantar aspect of the right great toe. Still requires debridement but less adherent callus nonviable tissue and less debris over the wound surface. He is tolerating the Darco forefoot boot marginally, not a candidate for a TCC. 03/21/18; wound looks a lot better. Still some depth although the base of the wound looks healthy. He still has a fair amount or raised callus which requires debridement. He is tolerating the Darco forefoot boot. He is not a candidate for a TCC secondary to gait imbalance issues 04/04/18; Patient's wound looked a lot better 2 weeks ago. This week necrotic debris over the surface with considerable undermining. He's been using silver collagen. Our intake nurse picked up that he may not been using the Darco forefoot off loader secondary to balance issues. We put him back in a surgical shoe today 04/11/18; some improvement in this wound. He still comes in with callus thick skin and subcutaneous tissue over the wound circumference however debriding this today shows it looks better. We put him back in a surgical shoe last week because he had had some falls with a Darco and we were convinced he was using the Darco in any case. We've been using Endoform that we started last week 04/25/18. Wound is generally improved. We are using Endoform.He is offloading this better using a surgical shoe. He cannot tolerate any more aggressive offloading secondary to balance issues [place], [place] ([id number]) Objective Constitutional Patient is hypertensive.. Pulse regular and within target range for patient.Marland Kitchen Respirations regular, non-labored and within  target range.. Temperature is normal and within the target range for the patient.Marland Kitchen appears in no distress. Vitals Time Taken: 1:58 PM, Height: 70 in, Weight: 139 lbs, BMI: 19.9, Temperature: 97.7 F, Pulse: 75 bpm, Respiratory Rate: 16 breaths/min, Blood Pressure: 149/67 mmHg. General Notes: Wound exam; the base of the wound actually looks somewhat better. I removed thick skin and subcutaneous tissue from around the wound circumference is slight amount of debris over the wound surface. Hemostasis with direct pressure this really looks like it's filling in nicely. There is no evidence of surrounding infection Integumentary (  Hair, Skin) Wound #1 status is Open. Original cause of wound was Gradually Appeared. The wound is located on the [employer]. The wound measures 0.5cm length x 0.4cm width x 0.3cm depth; 0.157cm^2 area and 0.047cm^3 volume. There is Fat Layer (Subcutaneous Tissue) Exposed exposed. There is no tunneling or undermining noted. There is a medium amount of serous drainage noted. The wound margin is distinct with the outline attached to the wound base. There is no granulation within the wound bed. There is no necrotic tissue within the wound bed. The periwound skin appearance exhibited: Callus. The periwound skin appearance did not exhibit: Crepitus, Excoriation, Induration, Rash, Scarring, Dry/Scaly, Maceration, Atrophie Blanche, Cyanosis, Ecchymosis, Hemosiderin Staining, Mottled, Pallor, Rubor, Erythema. Periwound temperature was noted as No Abnormality. Assessment Active Problems ICD-10 Type 2 diabetes mellitus with foot ulcer Non-pressure chronic ulcer of other part of right foot with necrosis of muscle Type 2 diabetes mellitus with diabetic neuropathy, unspecified Procedures Wound #1 Pre-procedure diagnosis of Wound #1 is a Diabetic Wound/Ulcer of the Lower Extremity located on the Right,Plantar Toe Great .Severity of Tissue Pre Debridement is: Fat layer  exposed. There was a Excisional Skin/Subcutaneous Tissue Debridement with a total area of 0.2 sq cm performed by Ricard Dillon, MD. With the following instrument(s): Curette to remove Viable tissue/material. Material removed includes Callus and Subcutaneous Tissue and after achieving pain control using Lidocaine. No specimens were taken. A time out was conducted at 14:11, prior to the start of the procedure. A Minimum amount of bleeding was controlled with Pressure. The procedure was tolerated well. Post Debridement Measurements: 0.5cm length x 0.4cm width x 0.3cm depth; 0.047cm^3 volume. DENBLEYKER, Jamey ([id number]) Character of Wound/Ulcer Post Debridement requires further debridement. Severity of Tissue Post Debridement is: Fat layer exposed. Post procedure Diagnosis Wound #1: Same as Pre-Procedure Plan Wound Cleansing: Wound #1 Right,Plantar Toe Great: Clean wound with Normal Saline. Anesthetic (add to Medication List): Wound #1 Right,Plantar Toe Great: Topical Lidocaine 4% cream applied to wound bed prior to debridement (In Clinic Only). Primary Wound Dressing: Wound #1 Right,Plantar Toe Great: Other: - Endoform Secondary Dressing: Wound #1 Right,Plantar Toe Great: Gauze and Kerlix/Conform Dressing Change Frequency: Wound #1 Right,Plantar Toe Great: Change Dressing Monday, Wednesday, Friday Follow-up Appointments: Wound #1 Right,Plantar Toe Great: Return Appointment in 1 week. Off-Loading: Wound #1 Right,Plantar Toe Great: Open toe surgical shoe with peg assist. Additional Orders / Instructions: Wound #1 Right,Plantar Toe Great: Increase protein intake. #1 continue Endoform Curlex and conformer #2 for the first time it looks like this is really filling in. The patient credits the improvement to more aggressive offloading Electronic Signature(s) Signed: 04/25/2018 5:24:02 PM By: Linton Ham MD Entered By: Linton Ham on 04/25/2018 14:38:11 [place], [place]  ([id number]) -------------------------------------------------------------------------------- SuperBill Details Patient Name: [name] [name] Date of Service: 04/25/2018 Medical Record Number: [id number] Patient Account Number: [id number] Date of Birth/Sex: June 18, 1960 (57 y.o. M) Treating RN: Cornell Barman Primary Care Provider: Soyla Dryer Other Clinician: Referring Provider: Soyla Dryer Treating Provider/Extender: Tito Dine in Treatment: 13 Diagnosis Coding ICD-10 Codes Code Description E11.621 Type 2 diabetes mellitus with foot ulcer L97.513 Non-pressure chronic ulcer of other part of right foot with necrosis of muscle E11.40 Type 2 diabetes mellitus with diabetic neuropathy, unspecified Facility Procedures CPT4 Code Description: [id number] 11042 - DEB SUBQ TISSUE 20 SQ CM/< ICD-10 Diagnosis Description E11.621 Type 2 diabetes mellitus with foot ulcer L97.513 Non-pressure chronic ulcer of other part of right foot with necr Modifier: osis of muscle Quantity: 1 Physician Procedures  CPT4 Code Description: [id number] 11042 - WC PHYS SUBQ TISS 20 SQ CM ICD-10 Diagnosis Description E11.621 Type 2 diabetes mellitus with foot ulcer L97.513 Non-pressure chronic ulcer of other part of right foot with necr Modifier: osis of muscle Quantity: 1 Electronic Signature(s) Signed: 04/25/2018 5:24:02 PM By: Linton Ham MD Entered By: Linton Ham on 04/25/2018 14:38:39

## 2018-05-02 ENCOUNTER — Encounter: Payer: Self-pay | Admitting: Internal Medicine

## 2018-05-03 ENCOUNTER — Other Ambulatory Visit: Payer: Self-pay | Admitting: Physician Assistant

## 2018-05-03 DIAGNOSIS — Z1211 Encounter for screening for malignant neoplasm of colon: Secondary | ICD-10-CM

## 2018-05-03 LAB — IFOBT (OCCULT BLOOD): IMMUNOLOGICAL FECAL OCCULT BLOOD TEST: NEGATIVE

## 2018-05-04 NOTE — Progress Notes (Signed)
[place], DUBE ([id number]) Visit Report for 05/02/2018 Debridement Details Patient Name: [name] [name], [name] [name] Date of Service: 05/02/2018 1:30 PM Medical Record Number: [id number] Patient Account Number: [id number] Date of Birth/Sex: 1960-12-11 (57 y.o. M) Treating RN: Cornell Barman Primary Care Provider: Soyla Dryer Other Clinician: Referring Provider: Soyla Dryer Treating Provider/Extender: Tito Dine in Treatment: 14 Debridement Performed for Wound #1 Right,Plantar Toe Great Assessment: Performed By: Physician Ricard Dillon, MD Debridement Type: Debridement Severity of Tissue Pre Fat layer exposed Debridement: Level of Consciousness (Pre- Awake and Alert procedure): Pre-procedure Verification/Time Yes - 12:08 Out Taken: Start Time: 14:08 Pain Control: Lidocaine 4% Topical Solution Total Area Debrided (L x W): 0.7 (cm) x 0.5 (cm) = 0.35 (cm) Tissue and other material Callus, Subcutaneous, Skin: Dermis debrided: Level: Skin/Subcutaneous Tissue Debridement Description: Excisional Instrument: Curette Bleeding: Minimum Hemostasis Achieved: Pressure End Time: 14:11 Procedural Pain: 0 Post Procedural Pain: 0 Response to Treatment: Procedure was tolerated well Level of Consciousness Awake and Alert (Post-procedure): Post Debridement Measurements of Total Wound Length: (cm) 0.7 Width: (cm) 0.5 Depth: (cm) 0.5 Volume: (cm) 0.137 Character of Wound/Ulcer Post Debridement: Improved Severity of Tissue Post Debridement: Fat layer exposed Post Procedure Diagnosis Same as Pre-procedure Electronic Signature(s) Signed: 05/02/2018 5:21:06 PM By: Gretta Cool, BSN, RN, CWS, Kim RN, BSN Signed: 05/02/2018 5:31:58 PM By: Linton Ham MD Entered By: Linton Ham on 05/02/2018 15:01:44 [place], Kenric ([id number]) Gueydan, Kenyan ([id number]) -------------------------------------------------------------------------------- HPI Details Patient Name: [name] [name] Date of Service: 05/02/2018 1:30 PM Medical Record Number: [id number] Patient Account Number: [id number] Date of Birth/Sex: January 26, 1961 (57 y.o. M) Treating RN: Cornell Barman Primary Care Provider: Soyla Dryer Other Clinician: Referring Provider: Soyla Dryer Treating Provider/Extender: Tito Dine in Treatment: 14 History of Present Illness HPI Description: 01/24/18-He is seen in initial evaluation for right great toe ulcer. He was sent to the emergency room on 718 from the nurse clinic office for new-onset diabetes with an A1c of 12.9. He was discharged from the emergency room on metformin, had a right foot x-ray that was negative for osteomyelitis and had a follow-up with his primary care at the free clinic on 7/22. He has been treated twice with antibiotic therapy; bactrim (7/8) and doxycycline (7/18). He is neuropathic. He tolerated debridement; bone is palpable through a thin layer of tissue; there is, what appears to be, resolving erythema and edema; a culture was taken, antibiotics will be prescribed as needed based on sensitivities. 01/31/18; this is a patient with new onset type 2 diabetes who is on oral agents. Culture from last time showed a few Klebsiella and a few enterococcus is being appropriately treated with a combination of Augmentin and ciprofloxacin for 10 days which I think he just started yesterday. He has to get his medications through the free clinic in [place]. He has no insurance. Been using Prisma to the wound. Previous x-ray last month was negative for osteomyelitis.his ABIs look reasonable. He has an offloading sandal 02/07/18; he completes his antibiotics which are Augmentin and ciprofloxacin on Saturday. Using silver collagen. Once again he has callus around the wound and not a very viable looking wound surface. We have been giving him an offloading sandal but I think he is going to require more offloading than that. Previous x-ray was  negative. ABIs in our clinic quite reasonable 02/14/18; he arrives today with the wound dimensions slightly smaller. Still callus nonviable subcutaneous tissue around the wound circumference. He is still in a offloading shoe rather than the Darco. I transitioned him  into the Darco today 02/21/18; slight improvement in dimensions. Still aggressive debridements using Prisma. He is using a Darco forefoot offloading boot 02/28/18; not much improvement. Still requiring aggressive debridement using Prisma. I changed him to Iodoflex to help with the wound debridement. He is using a Darco forefoot offloading boot but tells me he had 1 fall this week. There is not an option for more aggressive offloading. x-ray I ordered last week was negative for osteomyelitis 03/07/18; not much improvement. He arrives with thick skin subcutaneous tissue overhanging the wound covered and callus. This requires debridement. Post debridement the wound surface looks pretty good. As usual have thoughts about of total contact cast but after talking to the patient and his mother and observing the patient leave the clinic there is absolutely no way he could tolerate a total contact cast. He is barely able to maintain balance and the Darco forefoot boot. I suspect this is secondary to diabetic neuropathy 03/14/18; better looking wound today. Plantar aspect of the right great toe. Still requires debridement but less adherent callus nonviable tissue and less debris over the wound surface. He is tolerating the Darco forefoot boot marginally, not a candidate for a TCC. 03/21/18; wound looks a lot better. Still some depth although the base of the wound looks healthy. He still has a fair amount or raised callus which requires debridement. He is tolerating the Darco forefoot boot. He is not a candidate for a TCC secondary to gait imbalance issues 04/04/18; Patient's wound looked a lot better 2 weeks ago. This week necrotic debris over the  surface with considerable undermining. He's been using silver collagen. Our intake nurse picked up that he may not been using the Darco forefoot off loader secondary to balance issues. We put him back in a surgical shoe today 04/11/18; some improvement in this wound. He still comes in with callus thick skin and subcutaneous tissue over the wound circumference however debriding this today shows it looks better. We put him back in a surgical shoe last week because he had had some falls with a Darco and we were convinced he was using the Darco in any case. We've been using Endoform that we started last week 04/25/18. Wound is generally improved. We are using Endoform.He is offloading this better using a surgical shoe. He cannot tolerate any more aggressive offloading secondary to balance issues 05/02/18; I changed him to Endoform 2 weeks ago and last week this looked better. However he arrives in clinic today with absolutely no improvement. Still requiring debridement. Interestingly it looks as though the great toe had punched a hole in the surface of his healing sandal KNIPPLE, Tymeir ([id number]) Electronic Signature(s) Signed: 05/02/2018 5:31:58 PM By: Linton Ham MD Entered By: Linton Ham on 05/02/2018 15:06:45 [place], [place] ([id number]) -------------------------------------------------------------------------------- Physical Exam Details Patient Name: [name] [name] Date of Service: 05/02/2018 1:30 PM Medical Record Number: [id number] Patient Account Number: [id number] Date of Birth/Sex: [birth year] (57 y.o. M) Treating RN: Cornell Barman Primary Care Provider: Soyla Dryer Other Clinician: Referring Provider: Soyla Dryer Treating Provider/Extender: Tito Dine in Treatment: 14 Constitutional Sitting or standing Blood Pressure is within target range for patient.. Pulse regular and within target range for patient.Marland Kitchen Respirations regular, non-labored and within  target range.. Temperature is normal and within the target range for the patient.Marland Kitchen appears in no distress. Notes Wound exam; the base of the wound again had callus and skin overhanging necrotic tissue over the wound bed all of which I removed with a #5  curet hemostasis with direct pressure. There is no evidence of infection Electronic Signature(s) Signed: 05/02/2018 5:31:58 PM By: Linton Ham MD Entered By: Linton Ham on 05/02/2018 15:07:49 [place], [place] ([id number]) -------------------------------------------------------------------------------- Physician Orders Details Patient Name: [name] [name] Date of Service: 05/02/2018 1:30 PM Medical Record Number: [id number] Patient Account Number: [id number] Date of Birth/Sex: 18-Jun-1960 (57 y.o. M) Treating RN: Cornell Barman Primary Care Provider: Soyla Dryer Other Clinician: Referring Provider: Soyla Dryer Treating Provider/Extender: Tito Dine in Treatment: 14 Verbal / Phone Orders: No Diagnosis Coding Wound Cleansing Wound #1 Right,Plantar Toe Great o Clean wound with Normal Saline. Anesthetic (add to Medication List) Wound #1 Right,Plantar Toe Great o Topical Lidocaine 4% cream applied to wound bed prior to debridement (In Clinic Only). Primary Wound Dressing Wound #1 Right,Plantar Toe Great o Silver Alginate Secondary Dressing Wound #1 Right,Plantar Toe Great o Dry Gauze o Conform/Kerlix Dressing Change Frequency Wound #1 Right,Plantar Toe Great o Three times weekly Follow-up Appointments Wound #1 Right,Plantar Toe Great o Return Appointment in 2 weeks. Off-Loading Wound #1 Right,Plantar Toe Great o Open toe surgical shoe to: Electronic Signature(s) Signed: 05/02/2018 5:21:06 PM By: Gretta Cool, BSN, RN, CWS, Kim RN, BSN Signed: 05/02/2018 5:31:58 PM By: Linton Ham MD Entered By: Gretta Cool, BSN, RN, CWS, Kim on 05/02/2018 14:12:58 [place], Josph  ([id number]) -------------------------------------------------------------------------------- Problem List Details Patient Name: [name] [name] Date of Service: 05/02/2018 1:30 PM Medical Record Number: [id number] Patient Account Number: [id number] Date of Birth/Sex: October 10, 1960 (57 y.o. 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 ([id number]) -------------------------------------------------------------------------------- Progress Note Details Patient Name: [name] [name] Date of Service: 05/02/2018 1:30 PM Medical Record Number: [id number] Patient Account Number: [id number] Date of Birth/Sex: Dec 09, 1960 (57 y.o. M) Treating RN: Cornell Barman Primary Care Provider: Soyla Dryer Other Clinician: Referring Provider: Soyla Dryer Treating Provider/Extender: Tito Dine in Treatment: 14 Subjective History of Present Illness (HPI) 01/24/18-He is seen in initial evaluation for right great toe ulcer. He was sent to the emergency room on 718 from the nurse clinic office for new-onset diabetes with an A1c of 12.9. He was discharged from the emergency room on metformin, had a right foot x-ray that was negative for osteomyelitis and had a follow-up with his primary care at the free clinic on 7/22. He has been treated  twice with antibiotic therapy; bactrim (7/8) and doxycycline (7/18). He is neuropathic. He tolerated debridement; bone is palpable through a thin layer of tissue; there is, what appears to be, resolving erythema and edema; a culture was taken, antibiotics will be prescribed as needed based on sensitivities. 01/31/18; this is a patient with new onset type 2 diabetes who is on oral agents. Culture from last time showed a few Klebsiella and a few enterococcus is being appropriately treated with a combination of Augmentin and ciprofloxacin for 10 days which I think he just started yesterday. He has to get his medications through the free clinic in [place]. He has no insurance. Been using Prisma to the  wound. Previous x-ray last month was negative for osteomyelitis.his ABIs look reasonable. He has an offloading sandal 02/07/18; he completes his antibiotics which are Augmentin and ciprofloxacin on Saturday. Using silver collagen. Once again he has callus around the wound and not a very viable looking wound surface. We have been giving him an offloading sandal but I think he is going to require more offloading than that. Previous x-ray was negative. ABIs in our clinic quite reasonable 02/14/18; he arrives today with the wound dimensions slightly smaller. Still callus nonviable subcutaneous tissue around the wound circumference. He is still in a offloading shoe rather than the Darco. I transitioned him into the Darco today 02/21/18; slight improvement in dimensions. Still aggressive debridements using Prisma. He is using a Darco forefoot offloading boot 02/28/18; not much improvement. Still requiring aggressive debridement using Prisma. I changed him to Iodoflex to help with the wound debridement. He is using a Darco forefoot offloading boot but tells me he had 1 fall this week. There is not an option for more aggressive offloading. x-ray I ordered last week was negative for osteomyelitis 03/07/18; not much  improvement. He arrives with thick skin subcutaneous tissue overhanging the wound covered and callus. This requires debridement. Post debridement the wound surface looks pretty good. As usual have thoughts about of total contact cast but after talking to the patient and his mother and observing the patient leave the clinic there is absolutely no way he could tolerate a total contact cast. He is barely able to maintain balance and the Darco forefoot boot. I suspect this is secondary to diabetic neuropathy 03/14/18; better looking wound today. Plantar aspect of the right great toe. Still requires debridement but less adherent callus nonviable tissue and less debris over the wound surface. He is tolerating the Darco forefoot boot marginally, not a candidate for a TCC. 03/21/18; wound looks a lot better. Still some depth although the base of the wound looks healthy. He still has a fair amount or raised callus which requires debridement. He is tolerating the Darco forefoot boot. He is not a candidate for a TCC secondary to gait imbalance issues 04/04/18; Patient's wound looked a lot better 2 weeks ago. This week necrotic debris over the surface with considerable undermining. He's been using silver collagen. Our intake nurse picked up that he may not been using the Darco forefoot off loader secondary to balance issues. We put him back in a surgical shoe today 04/11/18; some improvement in this wound. He still comes in with callus thick skin and subcutaneous tissue over the wound circumference however debriding this today shows it looks better. We put him back in a surgical shoe last week because he had had some falls with a Darco and we were convinced he was using the Darco in any case. We've been using Endoform that we started last week 04/25/18. Wound is generally improved. We are using Endoform.He is offloading this better using a surgical shoe. He cannot tolerate any more aggressive offloading  secondary to balance issues 05/02/18; I changed him to Endoform 2 weeks ago and last week this looked better. However he arrives in clinic today with absolutely no improvement. Still requiring debridement. Interestingly it looks as though the great toe had punched a hole in the surface of his healing sandal [name] [name], [name] [name] ([id number]) Objective Constitutional Sitting or standing Blood Pressure is within target range for patient.. Pulse regular and within target range for patient.Marland Kitchen Respirations regular, non-labored and within target range.. Temperature is normal and  within the target range for the patient.Marland Kitchen appears in no distress. Vitals Time Taken: 1:34 PM, Height: 70 in, Weight: 139 lbs, BMI: 19.9, Temperature: 98.0 F, Pulse: 94 bpm, Respiratory Rate: 18 breaths/min, Blood Pressure: 129/68 mmHg. General Notes: Wound exam; the base of the wound again had callus and skin overhanging necrotic tissue over the wound bed all of which I removed with a #5 curet hemostasis with direct pressure. There is no evidence of infection Integumentary (Hair, Skin) Wound #1 status is Open. Original cause of wound was Gradually Appeared. The wound is located on the [employer]. The wound measures 0.7cm length x 0.5cm width x 0.5cm depth; 0.275cm^2 area and 0.137cm^3 volume. There is Fat Layer (Subcutaneous Tissue) Exposed exposed. There is no tunneling or undermining noted. There is a medium amount of serous drainage noted. The wound margin is distinct with the outline attached to the wound base. There is medium (34- 66%) granulation within the wound bed. There is a medium (34-66%) amount of necrotic tissue within the wound bed including Adherent Slough. The periwound skin appearance exhibited: Callus. The periwound skin appearance did not exhibit: Crepitus, Excoriation, Induration, Rash, Scarring, Dry/Scaly, Maceration, Atrophie Blanche, Cyanosis, Ecchymosis, Hemosiderin Staining, Mottled, Pallor,  Rubor, Erythema. Periwound temperature was noted as No Abnormality. Assessment Active Problems ICD-10 Type 2 diabetes mellitus with foot ulcer Non-pressure chronic ulcer of other part of right foot with necrosis of muscle Type 2 diabetes mellitus with diabetic neuropathy, unspecified Procedures Wound #1 Pre-procedure diagnosis of Wound #1 is a Diabetic Wound/Ulcer of the Lower Extremity located on the Right,Plantar Toe Great .Severity of Tissue Pre Debridement is: Fat layer exposed. There was a Excisional Skin/Subcutaneous Tissue Debridement with a total area of 0.35 sq cm performed by Ricard Dillon, MD. With the following instrument(s): Curette Material removed includes Callus, Subcutaneous Tissue, and Skin: Dermis after achieving pain control using Lidocaine 4% Topical Solution. A time out was conducted at 12:08, prior to the start of the procedure. A Minimum amount of [name] [name], [name] [name] ([id number]) bleeding was controlled with Pressure. The procedure was tolerated well with a pain level of 0 throughout and a pain level of 0 following the procedure. Post Debridement Measurements: 0.7cm length x 0.5cm width x 0.5cm depth; 0.137cm^3 volume. Character of Wound/Ulcer Post Debridement is improved. Severity of Tissue Post Debridement is: Fat layer exposed. Post procedure Diagnosis Wound #1: Same as Pre-Procedure Plan Wound Cleansing: Wound #1 Right,Plantar Toe Great: Clean wound with Normal Saline. Anesthetic (add to Medication List): Wound #1 Right,Plantar Toe Great: Topical Lidocaine 4% cream applied to wound bed prior to debridement (In Clinic Only). Primary Wound Dressing: Wound #1 Right,Plantar Toe Great: Silver Alginate Secondary Dressing: Wound #1 Right,Plantar Toe Great: Dry Gauze Conform/Kerlix Dressing Change Frequency: Wound #1 Right,Plantar Toe Great: Three times weekly Follow-up Appointments: Wound #1 Right,Plantar Toe Great: Return Appointment in 2  weeks. Off-Loading: Wound #1 Right,Plantar Toe Great: Open toe surgical shoe to: #1 I switched the primary dressing to silver alginate #2 we replaced his healing sandal not sure how he managed to do this Electronic Signature(s) Signed: 05/02/2018 5:31:58 PM By: Linton Ham MD Entered By: Linton Ham on 05/02/2018 15:10:34 [place], [place] ([id number]) -------------------------------------------------------------------------------- SuperBill Details Patient Name: [name] [name] Date of Service: 05/02/2018 Medical Record Number: [id number] Patient Account Number: [id number] Date of Birth/Sex: 1961-05-30 (57 y.o. M) Treating RN: Cornell Barman Primary Care Provider: Soyla Dryer Other Clinician: Referring Provider: Soyla Dryer Treating Provider/Extender: Tito Dine in Treatment: 14 Diagnosis Coding ICD-10 Codes Code  Description E11.621 Type 2 diabetes mellitus with foot ulcer L97.513 Non-pressure chronic ulcer of other part of right foot with necrosis of muscle E11.40 Type 2 diabetes mellitus with diabetic neuropathy, unspecified Facility Procedures CPT4 Code Description: [id number] 11042 - DEB SUBQ TISSUE 20 SQ CM/< ICD-10 Diagnosis Description L97.513 Non-pressure chronic ulcer of other part of right foot with necr Modifier: osis of muscle Quantity: 1 Physician Procedures CPT4 Code Description: [id number] - WC PHYS SUBQ TISS 20 SQ CM ICD-10 Diagnosis Description L97.513 Non-pressure chronic ulcer of other part of right foot with necr Modifier: osis of muscle Quantity: 1 Electronic Signature(s) Signed: 05/02/2018 5:31:58 PM By: Linton Ham MD Entered By: Linton Ham on 05/02/2018 15:11:06

## 2018-05-04 NOTE — Progress Notes (Signed)
[name], [name] ([id number]) Visit Report for 05/02/2018 Arrival Information Details Patient Name: [name], [name] Date of Service: 05/02/2018 1:30 PM Medical Record Number: [id number] Patient Account Number: [id number] Date of Birth/Sex: June 04, 1961 (57 y.o. M) Treating RN: Montey Hora Primary Care [name] [name]: Soyla Dryer Other Clinician: Referring Aerica [name]: Soyla Dryer Treating [name] [name]/Extender: Tito Dine in Treatment: 14 Visit Information History Since Last Visit Added or deleted any medications: No Patient Arrived: Ambulatory Any new allergies or adverse reactions: No Arrival Time: 13:34 Had a fall or experienced change in No Accompanied By: mother activities of daily living that may affect Transfer Assistance: None risk of falls: Patient Identification Verified: Yes Signs or symptoms of abuse/neglect since last visito No Secondary Verification Process Completed: Yes Hospitalized since last visit: No Implantable device outside of the clinic excluding No cellular tissue based products placed in the center since last visit: Has Dressing in Place as Prescribed: Yes Pain Present Now: No Electronic Signature(s) Signed: 05/02/2018 3:28:51 PM By: Montey Hora Entered By: Montey Hora on 05/02/2018 13:34:45 [name], [name] ([id number]) -------------------------------------------------------------------------------- Encounter Discharge Information Details Patient Name: [name] [name] Date of Service: 05/02/2018 1:30 PM Medical Record Number: [id number] Patient Account Number: [id number] Date of Birth/Sex: [birth year] (57 y.o. M) Treating RN: Montey Hora Primary Care [name] [name]: Soyla Dryer Other Clinician: Referring Yamira Papa: Soyla Dryer Treating Alysiana Ethridge/Extender: Tito Dine in Treatment: 14 Encounter Discharge Information Items Post Procedure Vitals Discharge Condition: Stable Temperature (F): 98.0 Ambulatory Status:  Ambulatory Pulse (bpm): 94 Discharge Destination: Home Respiratory Rate (breaths/min): 16 Transportation: Private Auto Blood Pressure (mmHg): 129/68 Accompanied By: mother Schedule Follow-up Appointment: Yes Clinical Summary of Care: Electronic Signature(s) Signed: 05/02/2018 3:28:51 PM By: Montey Hora Entered By: Montey Hora on 05/02/2018 14:23:31 [name], [name] ([id number]) -------------------------------------------------------------------------------- Lower Extremity Assessment Details Patient Name: [name] [name] Date of Service: 05/02/2018 1:30 PM Medical Record Number: [id number] Patient Account Number: [id number] Date of Birth/Sex: Mar 27, 1961 (57 y.o. M) Treating RN: Montey Hora Primary Care Elkin Belfield: Soyla Dryer Other Clinician: Referring Tauna Macfarlane: Soyla Dryer Treating [name] [name]/Extender: Tito Dine in Treatment: 14 Vascular Assessment Claudication: Claudication Assessment [Right:None] Pulses: Dorsalis Pedis Palpable: [Right:Yes] Posterior Tibial Extremity colors, hair growth, and conditions: Extremity Color: [Right:Normal] Hair Growth on Extremity: [Right:Yes] Temperature of Extremity: [Right:Warm] Capillary Refill: [Right:< 3 seconds] Toe Nail Assessment Left: Right: Thick: Yes Discolored: Yes Deformed: Yes Improper Length and Hygiene: Yes Electronic Signature(s) Signed: 05/02/2018 3:28:51 PM By: Montey Hora Entered By: Montey Hora on 05/02/2018 13:40:06 [name], [name] ([id number]) -------------------------------------------------------------------------------- Multi Wound Chart Details Patient Name: [name] [name] Date of Service: 05/02/2018 1:30 PM Medical Record Number: [id number] Patient Account Number: [id number] Date of Birth/Sex: 1960-10-21 (57 y.o. M) Treating RN: [name] [name] Primary Care Labresha Mellor: Soyla Dryer Other Clinician: Referring [name] [name]: Soyla Dryer Treating [name] [name]/Extender: Tito Dine in Treatment: 14 Vital Signs Height(in): 70 Pulse(bpm): 37 Weight(lbs): 139 Blood Pressure(mmHg): 129/68 Body Mass Index(BMI): 20 Temperature(F): 98.0 Respiratory Rate 18 (breaths/min): Photos: [1:No Photos] [N/A:N/A] Wound Location: [1:Right Toe Great - Plantar] [N/A:N/A] Wounding Event: [1:Gradually Appeared] [N/A:N/A] Primary Etiology: [1:Diabetic Wound/Ulcer of the Lower Extremity] [N/A:N/A] Comorbid History: [1:Cataracts, Hypertension, Type II Diabetes, Rheumatoid Arthritis, Neuropathy] [N/A:N/A] Date Acquired: [1:12/24/2017] [N/A:N/A] Weeks of Treatment: [1:14] [N/A:N/A] Wound Status: [1:Open] [N/A:N/A] Measurements L x W x D [1:0.7x0.5x0.5] [N/A:N/A] (cm) Area (cm) : [1:0.275] [N/A:N/A] Volume (cm) : [1:0.137] [N/A:N/A] % Reduction in Area: [1:50.00%] [N/A:N/A] % Reduction in Volume: [1:58.50%] [N/A:N/A] Classification: [1:Grade 2] [N/A:N/A] Exudate Amount: [1:Medium] [N/A:N/A] Exudate Type: [1:Serous] [N/A:N/A] Exudate Color: [1:amber] [N/A:N/A] Wound Margin: [1:Distinct, outline  attached] [N/A:N/A] Granulation Amount: [1:Medium (34-66%)] [N/A:N/A] Necrotic Amount: [1:Medium (34-66%)] [N/A:N/A] Exposed Structures: [1:Fat Layer (Subcutaneous Tissue) Exposed: Yes Fascia: No Tendon: No Muscle: No Joint: No Bone: No] [N/A:N/A] Epithelialization: [1:None] [N/A:N/A] Debridement: [1:Debridement - Excisional] [N/A:N/A] Pre-procedure [1:12:08] [N/A:N/A] Verification/Time Out Taken: Pain Control: [1:Lidocaine 4% Topical Solution] [N/A:N/A] Tissue Debrided: [1:Callus, Subcutaneous] [N/A:N/A] Level: Skin/Subcutaneous Tissue N/A N/A Debridement Area (sq cm): 0.35 N/A N/A Instrument: Curette N/A N/A Bleeding: Minimum N/A N/A Hemostasis Achieved: Pressure N/A N/A Procedural Pain: 0 N/A N/A Post Procedural Pain: 0 N/A N/A Debridement Treatment Procedure was tolerated well N/A N/A Response: Post Debridement 0.7x0.5x0.5 N/A N/A Measurements L x W x D (cm) Post  Debridement Volume: 0.137 N/A N/A (cm) Periwound Skin Texture: Callus: Yes N/A N/A Excoriation: No Induration: No Crepitus: No Rash: No Scarring: No Periwound Skin Moisture: Maceration: No N/A N/A Dry/Scaly: No Periwound Skin Color: Atrophie Blanche: No N/A N/A Cyanosis: No Ecchymosis: No Erythema: No Hemosiderin Staining: No Mottled: No Pallor: No Rubor: No Temperature: No Abnormality N/A N/A Tenderness on Palpation: No N/A N/A Wound Preparation: Ulcer Cleansing: N/A N/A Rinsed/Irrigated with Saline Topical Anesthetic Applied: Other: lidocaine 4% Procedures Performed: Debridement N/A N/A Treatment Notes Wound #1 (Right, Plantar Toe Great) Notes silvercel, foam, secured with conform and tape, darco front Dietitian) Signed: 05/02/2018 5:31:58 PM By: Linton Ham MD Entered By: Linton Ham on 05/02/2018 14:58:55 Buendia, Bosco ([id number]) -------------------------------------------------------------------------------- [place] Details Patient Name: [name] [name] Date of Service: 05/02/2018 1:30 PM Medical Record Number: [id number] Patient Account Number: [id number] Date of Birth/Sex: February 09, 1961 (57 y.o. M) Treating RN: [name] [name] Primary Care Robie Oats: Soyla Dryer Other Clinician: Referring Issabela Lesko: Soyla Dryer Treating Ilya Neely/Extender: Tito Dine in Treatment: 14 Active Inactive ` Necrotic Tissue Nursing Diagnoses: Knowledge deficit related to management of necrotic/devitalized tissue Goals: Necrotic/devitalized tissue will be minimized in the wound bed Date Initiated: 01/24/2018 Target Resolution Date: 02/09/2018 Goal Status: Active Interventions: Assess patient pain level pre-, during and post procedure and prior to discharge Treatment Activities: Excisional debridement : 01/24/2018 Notes: ` Orientation to the Wound Care Program Nursing Diagnoses: Knowledge deficit related to  the wound healing center program Goals: Patient/caregiver will verbalize understanding of the [place] Date Initiated: 01/24/2018 Target Resolution Date: 02/09/2018 Goal Status: Active Interventions: Provide education on orientation to the wound center Notes: ` Pressure Nursing Diagnoses: Knowledge deficit related to management of pressures ulcers Goals: Patient will remain free from development of additional pressure ulcers Date Initiated: 01/24/2018 Target Resolution Date: 02/09/2018 KASHIF, POOLER ([id number]) Goal Status: Active Interventions: Assess potential for pressure ulcer upon admission and as needed Notes: ` Wound/Skin Impairment Nursing Diagnoses: Knowledge deficit related to ulceration/compromised skin integrity Goals: Ulcer/skin breakdown will have a volume reduction of 30% by week 4 Date Initiated: 01/24/2018 Target Resolution Date: 02/24/2018 Goal Status: Active Interventions: Assess ulceration(s) every visit Treatment Activities: Topical wound management initiated : 01/24/2018 Notes: Electronic Signature(s) Signed: 05/02/2018 5:21:06 PM By: Gretta Cool, BSN, RN, CWS, Kim RN, BSN Entered By: Gretta Cool, BSN, RN, CWS, Kim on 05/02/2018 14:07:48 [place], [place] ([id number]) -------------------------------------------------------------------------------- Pain Assessment Details Patient Name: [name] [name] Date of Service: 05/02/2018 1:30 PM Medical Record Number: [id number] Patient Account Number: [id number] Date of Birth/Sex: [birth year] (57 y.o. M) Treating RN: Montey Hora Primary Care Jabez Molner: Soyla Dryer Other Clinician: Referring Kamarii Carton: Soyla Dryer Treating Marijah Larranaga/Extender: Tito Dine in Treatment: 14 Active Problems Location of Pain Severity and Description of Pain Patient Has Paino No Site Locations Pain Management and Medication Current Pain  Management: Electronic Signature(s) Signed: 05/02/2018 3:28:51 PM  By: Montey Hora Entered By: Montey Hora on 05/02/2018 13:34:54 Renbarger, Tanush ([id number]) -------------------------------------------------------------------------------- Patient/Caregiver Education Details Patient Name: [name] [name] Date of Service: 05/02/2018 1:30 PM Medical Record Number: [id number] Patient Account Number: [id number] Date of Birth/Gender: 09/14/1960 (57 y.o. M) Treating RN: [name] [name] Primary Care Physician: Soyla Dryer Other Clinician: Referring Physician: Soyla Dryer Treating Physician/Extender: Tito Dine in Treatment: 14 Education Assessment Education Provided To: Patient Education Topics Provided Pressure: Handouts: Pressure Ulcers: Care and Offloading Methods: Demonstration, Explain/Verbal Responses: State content correctly Wound/Skin Impairment: Handouts: Caring for Your Ulcer, Other: continue wound care as prescribed Methods: Demonstration, Explain/Verbal Responses: State content correctly Electronic Signature(s) Signed: 05/02/2018 5:21:06 PM By: Gretta Cool, BSN, RN, CWS, Kim RN, BSN Entered By: Gretta Cool, BSN, RN, CWS, Kim on 05/02/2018 14:14:19 Stiles, Najib ([id number]) -------------------------------------------------------------------------------- Wound Assessment Details Patient Name: [name] [name] Date of Service: 05/02/2018 1:30 PM Medical Record Number: [id number] Patient Account Number: [id number] Date of Birth/Sex: 21-Sep-1960 (57 y.o. M) Treating RN: Montey Hora Primary Care Imya Mance: Soyla Dryer Other Clinician: Referring Ketzaly Cardella: Soyla Dryer Treating Worth Kober/Extender: Tito Dine in Treatment: 14 Wound Status Wound Number: 1 Primary Diabetic Wound/Ulcer of the Lower Extremity Etiology: Wound Location: Right Toe Great - Plantar Wound Open Wounding Event: Gradually Appeared Status: Date Acquired: 12/24/2017 Comorbid Cataracts, Hypertension, Type II Diabetes, Weeks Of Treatment:  14 History: Rheumatoid Arthritis, Neuropathy Clustered Wound: No Photos Photo Uploaded By: Montey Hora on 05/02/2018 15:25:08 Wound Measurements Length: (cm) 0.7 Width: (cm) 0.5 Depth: (cm) 0.5 Area: (cm) 0.275 Volume: (cm) 0.137 % Reduction in Area: 50% % Reduction in Volume: 58.5% Epithelialization: None Tunneling: No Undermining: No Wound Description Classification: Grade 2 Wound Margin: Distinct, outline attached Exudate Amount: Medium Exudate Type: Serous Exudate Color: amber Foul Odor After Cleansing: No Slough/Fibrino No Wound Bed Granulation Amount: Medium (34-66%) Exposed Structure Necrotic Amount: Medium (34-66%) Fascia Exposed: No Necrotic Quality: Adherent Slough Fat Layer (Subcutaneous Tissue) Exposed: Yes Tendon Exposed: No Muscle Exposed: No Joint Exposed: No Bone Exposed: No Periwound Skin Texture Munroe, Kingsley ([id number]) Texture Color No Abnormalities Noted: No No Abnormalities Noted: No Callus: Yes Atrophie Blanche: No Crepitus: No Cyanosis: No Excoriation: No Ecchymosis: No Induration: No Erythema: No Rash: No Hemosiderin Staining: No Scarring: No Mottled: No Pallor: No Moisture Rubor: No No Abnormalities Noted: No Dry / Scaly: No Temperature / Pain Maceration: No Temperature: No Abnormality Wound Preparation Ulcer Cleansing: Rinsed/Irrigated with Saline Topical Anesthetic Applied: Other: lidocaine 4%, Treatment Notes Wound #1 (Right, Plantar Toe Great) Notes silvercel, foam, secured with conform and tape, darco front Teacher, adult education Signature(s) Signed: 05/02/2018 3:28:51 PM By: Montey Hora Entered By: Montey Hora on 05/02/2018 13:39:19 Rhude, Jimmey ([id number]) -------------------------------------------------------------------------------- Vitals Details Patient Name: [name] [name] Date of Service: 05/02/2018 1:30 PM Medical Record Number: [id number] Patient Account Number: [id number] Date of  Birth/Sex: 1960/06/18 (57 y.o. M) Treating RN: Montey Hora Primary Care Billiejean Schimek: Soyla Dryer Other Clinician: Referring Leya Paige: Soyla Dryer Treating Kellsie Grindle/Extender: Tito Dine in Treatment: 14 Vital Signs Time Taken: 13:34 Temperature (F): 98.0 Height (in): 70 Pulse (bpm): 94 Weight (lbs): 139 Respiratory Rate (breaths/min): 18 Body Mass Index (BMI): 19.9 Blood Pressure (mmHg): 129/68 Reference Range: 80 - 120 mg / dl Electronic Signature(s) Signed: 05/02/2018 3:28:51 PM By: Montey Hora Entered By: Montey Hora on 05/02/2018 13:38:04

## 2018-05-08 ENCOUNTER — Encounter: Payer: Self-pay | Admitting: Physician Assistant

## 2018-05-16 ENCOUNTER — Ambulatory Visit: Payer: Self-pay | Admitting: Internal Medicine

## 2018-05-23 ENCOUNTER — Ambulatory Visit
Admission: RE | Admit: 2018-05-23 | Discharge: 2018-05-23 | Disposition: A | Discharge status: Home / Self Care | Payer: Self-pay | Source: Ambulatory Visit | Attending: Internal Medicine | Admitting: Internal Medicine

## 2018-05-23 ENCOUNTER — Other Ambulatory Visit (HOSPITAL_BASED_OUTPATIENT_CLINIC_OR_DEPARTMENT_OTHER): Payer: Self-pay | Admitting: Internal Medicine

## 2018-05-23 ENCOUNTER — Encounter: Payer: Self-pay | Attending: Internal Medicine | Admitting: Internal Medicine

## 2018-05-23 ENCOUNTER — Other Ambulatory Visit
Admission: RE | Admit: 2018-05-23 | Discharge: 2018-05-23 | Disposition: A | Discharge status: Home / Self Care | Payer: Self-pay | Source: Ambulatory Visit | Attending: Internal Medicine | Admitting: Internal Medicine

## 2018-05-23 DIAGNOSIS — I10 Essential (primary) hypertension: Secondary | ICD-10-CM | POA: Insufficient documentation

## 2018-05-23 DIAGNOSIS — B999 Unspecified infectious disease: Secondary | ICD-10-CM | POA: Insufficient documentation

## 2018-05-23 DIAGNOSIS — E11621 Type 2 diabetes mellitus with foot ulcer: Secondary | ICD-10-CM | POA: Insufficient documentation

## 2018-05-23 DIAGNOSIS — M069 Rheumatoid arthritis, unspecified: Secondary | ICD-10-CM | POA: Insufficient documentation

## 2018-05-23 DIAGNOSIS — L97513 Non-pressure chronic ulcer of other part of right foot with necrosis of muscle: Secondary | ICD-10-CM | POA: Insufficient documentation

## 2018-05-23 DIAGNOSIS — E1140 Type 2 diabetes mellitus with diabetic neuropathy, unspecified: Secondary | ICD-10-CM | POA: Insufficient documentation

## 2018-05-23 DIAGNOSIS — Z880 Allergy status to penicillin: Secondary | ICD-10-CM | POA: Insufficient documentation

## 2018-05-25 NOTE — Progress Notes (Signed)
TADHG, ESKEW ([id number]) Visit Report for 05/23/2018 Debridement Details Patient Name: [name] [name], [name] [name] Date of Service: 05/23/2018 2:00 PM Medical Record Number: [id number] Patient Account Number: [id number] Date of Birth/Sex: 01/18/1961 (57 y.o. M) Treating RN: [name] [name] Primary Care Provider: Soyla [name] Other Clinician: Referring Provider: Soyla [name] Treating Provider/Extender: [name] [name] in Treatment: 17 Debridement Performed for Wound #1 Right,Plantar Toe Great Assessment: Performed By: Physician [name] Dillon, MD Debridement Type: Debridement Severity of Tissue Pre Fat layer exposed Debridement: Level of Consciousness (Pre- Awake and Alert procedure): Pre-procedure Verification/Time Yes - 14:40 Out Taken: Start Time: 14:40 Pain Control: Lidocaine Total Area Debrided (L x W): 0.6 (cm) x 0.3 (cm) = 0.18 (cm) Tissue and other material Viable, Non-Viable, Callus, Slough, Subcutaneous, Skin: Dermis , Skin: Epidermis, Slough debrided: Level: Skin/Subcutaneous Tissue Debridement Description: Excisional Instrument: Blade, Forceps Bleeding: Minimum Hemostasis Achieved: Pressure End Time: 14:42 Response to Treatment: Procedure was tolerated well Level of Consciousness Awake and Alert (Post-procedure): Post Debridement Measurements of Total Wound Length: (cm) 2 Width: (cm) 2.2 Depth: (cm) 0.5 Volume: (cm) 1.728 Character of Wound/Ulcer Post Debridement: Requires Further Debridement Severity of Tissue Post Debridement: Fat layer exposed Post Procedure Diagnosis Same as Pre-procedure Electronic Signature(s) Signed: 05/23/2018 4:51:45 PM By: [name] Ham MD Signed: 05/23/2018 5:09:03 PM By: [name] [name], BSN, RN, CWS, Kim RN, BSN Entered By: [name] [name] on 05/23/2018 15:23:30 [name] [name], [name] [name] ([id number]) -------------------------------------------------------------------------------- HPI Details Patient Name: [name] [name] Date of  Service: 05/23/2018 2:00 PM Medical Record Number: [id number] Patient Account Number: [id number] Date of Birth/Sex: 07-13-1960 (57 y.o. M) Treating RN: [name] [name] Primary Care Provider: Soyla [name] Other Clinician: Referring Provider: Soyla [name] Treating Provider/Extender: [name] [name] in Treatment: 17 History of Present Illness HPI Description: 01/24/18-He is seen in initial evaluation for right great toe ulcer. He was sent to the emergency room on 718 from the nurse clinic office for new-onset diabetes with an A1c of 12.9. He was discharged from the emergency room on metformin, had a right foot x-ray that was negative for osteomyelitis and had a follow-up with his primary care at the free clinic on 7/22. He has been treated twice with antibiotic therapy; bactrim (7/8) and doxycycline (7/18). He is neuropathic. He tolerated debridement; bone is palpable through a thin layer of tissue; there is, what appears to be, resolving erythema and edema; a culture was taken, antibiotics will be prescribed as needed based on sensitivities. 01/31/18; this is a patient with new onset type 2 diabetes who is on oral agents. Culture from last time showed a few Klebsiella and a few enterococcus is being appropriately treated with a combination of Augmentin and ciprofloxacin for 10 days which I think he just started yesterday. He has to get his medications through the free clinic in [place]. He has no insurance. Been using Prisma to the wound. Previous x-ray last month was negative for osteomyelitis.his ABIs look reasonable. He has an offloading sandal 02/07/18; he completes his antibiotics which are Augmentin and ciprofloxacin on Saturday. Using silver collagen. Once again he has callus around the wound and not a very viable looking wound surface. We have been giving him an offloading sandal but I think he is going to require more offloading than that. Previous x-ray was negative. ABIs in  our clinic quite reasonable 02/14/18; he arrives today with the wound dimensions slightly smaller. Still callus nonviable subcutaneous tissue around the wound circumference. He is still in a offloading shoe rather than the Darco. I transitioned him into the Darco  today 02/21/18; slight improvement in dimensions. Still aggressive debridements using Prisma. He is using a Darco forefoot offloading boot 02/28/18; not much improvement. Still requiring aggressive debridement using Prisma. I changed him to Iodoflex to help with the wound debridement. He is using a Darco forefoot offloading boot but tells me he had 1 fall this week. There is not an option for more aggressive offloading. x-ray I ordered last week was negative for osteomyelitis 03/07/18; not much improvement. He arrives with thick skin subcutaneous tissue overhanging the wound covered and callus. This requires debridement. Post debridement the wound surface looks pretty good. As usual have thoughts about of total contact cast but after talking to the patient and his mother and observing the patient leave the clinic there is absolutely no way he could tolerate a total contact cast. He is barely able to maintain balance and the Darco forefoot boot. I suspect this is secondary to diabetic neuropathy 03/14/18; better looking wound today. Plantar aspect of the right great toe. Still requires debridement but less adherent callus nonviable tissue and less debris over the wound surface. He is tolerating the Darco forefoot boot marginally, not a candidate for a TCC. 03/21/18; wound looks a lot better. Still some depth although the base of the wound looks healthy. He still has a fair amount or raised callus which requires debridement. He is tolerating the Darco forefoot boot. He is not a candidate for a TCC secondary to gait imbalance issues 04/04/18; Patient's wound looked a lot better 2 weeks ago. This week necrotic debris over the surface with  considerable undermining. He's been using silver collagen. Our intake nurse picked up that he may not been using the Darco forefoot off loader secondary to balance issues. We put him back in a surgical shoe today 04/11/18; some improvement in this wound. He still comes in with callus thick skin and subcutaneous tissue over the wound circumference however debriding this today shows it looks better. We put him back in a surgical shoe last week because he had had some [name] [name] with a Darco and we were convinced he was using the Darco in any case. We've been using Endoform that we started last week 04/25/18. Wound is generally improved. We are using Endoform.He is offloading this better using a surgical shoe. He cannot tolerate any more aggressive offloading secondary to balance issues 05/02/18; I changed him to Endoform 2 weeks ago and last week this looked better. However he arrives in clinic today with absolutely no improvement. Still requiring debridement. Interestingly it looks as though the great toe had punched a hole in the surface of his healing sandal 05/23/2018; the patient has not been here in 3 weeks. Once again he arrives with a punched hole through his offloading [name] [name], [name] [name] ([id number]) healing sandal. I have not really seen this let alone twice in 2 visits. He also has a considerable amount of erythema in the plantar right great toe as well as dorsally. There is swelling and I suspect active infection. Previously he grew enterococcus and Klebsiella out of this wound some months ago. The Klebsiella was Augmentin resistant. He has not been systemically unwell. He claims to be keeping off this but I am doubtful this is the case Electronic Signature(s) Signed: 05/23/2018 4:51:45 PM By: [name] Ham MD Entered By: [name] [name] on 05/23/2018 15:25:28 [name] [name], [name] [name] ([id number]) -------------------------------------------------------------------------------- Physical Exam  Details Patient Name: [name] [name] Date of Service: 05/23/2018 2:00 PM Medical Record Number: [id number] Patient Account Number: [id number] Date of Birth/Sex: 01-18-1961 (  57 y.o. M) Treating RN: [name] [name] Primary Care Provider: Soyla [name] Other Clinician: Referring Provider: Soyla [name] Treating Provider/Extender: [name] [name] in Treatment: 17 Constitutional Sitting or standing Blood Pressure is within target range for patient.. Pulse regular and within target range for patient.Marland Kitchen Respirations regular, non-labored and within target range.Marland Kitchen appears in no distress. Notes Wound exam; the base of the wound had a small open area but considerable undermining. Using pickups and a #15 blade thick skin and subcutaneous tissue removed from the circumference to fully expose the wound. I then did a culture of the wound bed. There is erythema extending from the wound medially on the plantar aspect also on the dorsal aspect. There is no swelling of them MTP joint and no tenderness although he is reasonably insensate Electronic Signature(s) Signed: 05/23/2018 4:51:45 PM By: [name] Ham MD Entered By: [name] [name] on 05/23/2018 15:26:55 [name] [name], [name] [name] ([id number]) -------------------------------------------------------------------------------- Physician Orders Details Patient Name: [name] [name] Date of Service: 05/23/2018 2:00 PM Medical Record Number: [id number] Patient Account Number: [id number] Date of Birth/Sex: 1960-07-16 (57 y.o. M) Treating RN: [name] [name] Primary Care Provider: Soyla [name] Other Clinician: Referring Provider: Soyla [name] Treating Provider/Extender: [name] [name] in Treatment: 45 Verbal / Phone Orders: No Diagnosis Coding Wound Cleansing Wound #1 Right,Plantar Toe Great o Clean wound with Normal Saline. Anesthetic (add to Medication List) Wound #1 Right,Plantar Toe Great o Topical Lidocaine 4% cream applied to wound  bed prior to debridement (In Clinic Only). Primary Wound Dressing Wound #1 Right,Plantar Toe Great o Silver Alginate Secondary Dressing Wound #1 Right,Plantar Toe Great o Kerlix and Coban Dressing Change Frequency o Three times weekly Follow-up Appointments Wound #1 Right,Plantar Toe Great o Return Appointment in 1 week. Off-Loading Wound #1 Right,Plantar Toe Great o Open toe surgical shoe with peg assist. Medications-please add to medication list. Wound #1 Right,Plantar Toe Great o P.O. Antibiotics Laboratory o Bacteria identified in Wound by Culture (MICRO) - Right great toe oooo LOINC Code: 8315-1 oooo Convenience Name: Wound culture routine Radiology o X-ray, foot - Right great toe Que, Kramer ([id number]) Patient Medications Allergies: No Known Allergies Notifications Medication Indication Start End Augmentin wouind infection 05/24/2018 DOSE oral 875 mg-125 mg tablet - 1tablet oral bid for 7days doxycycline monohydrate wouind infection 05/23/2018 DOSE oral 100 mg capsule - 1 capsule oral bid for 7days Electronic Signature(s) Signed: 05/23/2018 3:03:59 PM By: [name] [name], BSN, RN, CWS, Kim RN, BSN Signed: 05/23/2018 4:51:45 PM By: [name] Ham MD Previous Signature: 05/23/2018 3:01:53 PM Version By: [name] Ham MD Previous Signature: 05/23/2018 2:58:44 PM Version By: [name] Ham MD Entered By: [name] [name], BSN, RN, CWS, Kim on 05/23/2018 15:03:58 [place], Sayge ([id number]) -------------------------------------------------------------------------------- Problem List Details Patient Name: [name] [name] Date of Service: 05/23/2018 2:00 PM Medical Record Number: [id number] Patient Account Number: [id number] Date of Birth/Sex: 1960/12/31 (57 y.o. 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 M) Treating RN: [name] [name] Primary Care Provider: Soyla [name] Other Clinician: Referring Provider: Soyla [name] Treating Provider/Extender: [name] [name] in Treatment: 17 Subjective History of Present Illness (HPI) 01/24/18-He is seen in initial evaluation for right great toe ulcer. He was sent to the emergency room on 718 from the nurse clinic office for new-onset diabetes with an A1c of 12.9. He was discharged from the emergency room on metformin, had a right foot x-ray that was negative for osteomyelitis and had a follow-up with his primary care at the free clinic on 7/22. He has been treated twice with antibiotic therapy; bactrim (7/8) and doxycycline (7/18). He is neuropathic. He tolerated debridement; bone is palpable through a thin layer of tissue; there is, what appears to be, resolving erythema and edema; a culture was taken, antibiotics will be prescribed as needed based on sensitivities. 01/31/18; this is a patient with new onset type 2 diabetes who is on oral agents. Culture from last time showed a few Klebsiella and a few enterococcus is being appropriately  treated with a combination of Augmentin and ciprofloxacin for 10 days which I think he just started yesterday. He has to get his medications through the free clinic in [place]. He has no insurance. Been using Prisma to the wound. Previous x-ray last month was negative for osteomyelitis.his ABIs look reasonable. He has an offloading sandal 02/07/18; he completes his antibiotics which are Augmentin and ciprofloxacin on Saturday. Using silver collagen. Once again he has callus around the wound and not a very viable looking wound surface. We have been giving him an offloading sandal but I think he is going to require more offloading than that. Previous x-ray was negative. ABIs in our clinic quite reasonable 02/14/18; he arrives today with the wound dimensions slightly smaller. Still callus nonviable subcutaneous tissue around the wound circumference. He is still in a offloading shoe rather than the Darco. I transitioned him into the Darco today 02/21/18; slight improvement in dimensions. Still aggressive debridements using Prisma. He is using a Darco forefoot offloading boot 02/28/18; not much improvement. Still requiring aggressive debridement using Prisma. I changed him to Iodoflex to help with the wound debridement. He is using a Darco forefoot offloading boot but tells me he had 1 fall this week. There is not an option for more aggressive offloading. x-ray I ordered last week was negative for osteomyelitis 03/07/18; not much improvement. He arrives with thick skin subcutaneous tissue overhanging the wound covered and callus. This requires debridement. Post debridement the wound surface looks pretty good. As usual have thoughts about of total contact cast but after talking to the patient and his mother and observing the patient leave the clinic there is absolutely no way he could tolerate a total contact cast. He is barely able to maintain balance and the Darco forefoot boot. I suspect this  is secondary to diabetic neuropathy 03/14/18; better looking wound today. Plantar aspect of the right great toe. Still requires debridement but less adherent callus nonviable tissue and less debris over the wound surface. He is tolerating the Darco forefoot boot marginally, not a candidate for a TCC. 03/21/18; wound looks a lot better. Still some depth although the base of the wound looks healthy. He still has a fair amount or raised callus which requires debridement. He is tolerating the Darco forefoot boot. He is not a candidate for a TCC secondary to gait imbalance issues 04/04/18;  Patient's wound looked a lot better 2 weeks ago. This week necrotic debris over the surface with considerable undermining. He's been using silver collagen. Our intake nurse picked up that he may not been using the Darco forefoot off loader secondary to balance issues. We put him back in a surgical shoe today 04/11/18; some improvement in this wound. He still comes in with callus thick skin and subcutaneous tissue over the wound circumference however debriding this today shows it looks better. We put him back in a surgical shoe last week because he had had some [name] [name] with a Darco and we were convinced he was using the Darco in any case. We've been using Endoform that we started last week 04/25/18. Wound is generally improved. We are using Endoform.He is offloading this better using a surgical shoe. He cannot tolerate any more aggressive offloading secondary to balance issues 05/02/18; I changed him to Endoform 2 weeks ago and last week this looked better. However he arrives in clinic today with absolutely no improvement. Still requiring debridement. Interestingly it looks as though the great toe had punched a hole in the surface of his healing sandal [name] [name], [name] [name] ([id number]) 05/23/2018; the patient has not been here in 3 weeks. Once again he arrives with a punched hole through his offloading healing sandal. I have  not really seen this let alone twice in 2 visits. He also has a considerable amount of erythema in the plantar right great toe as well as dorsally. There is swelling and I suspect active infection. Previously he grew enterococcus and Klebsiella out of this wound some months ago. The Klebsiella was Augmentin resistant. He has not been systemically unwell. He claims to be keeping off this but I am doubtful this is the case Objective Constitutional Sitting or standing Blood Pressure is within target range for patient.. Pulse regular and within target range for patient.Marland Kitchen Respirations regular, non-labored and within target range.Marland Kitchen appears in no distress. Vitals Time Taken: 2:13 PM, Height: 70 in, Weight: 139 lbs, BMI: 19.9, Temperature: 98.2 F, Pulse: 92 bpm, Respiratory Rate: 18 breaths/min, Blood Pressure: 123/58 mmHg. General Notes: Wound exam; the base of the wound had a small open area but considerable undermining. Using pickups and a #15 blade thick skin and subcutaneous tissue removed from the circumference to fully expose the wound. I then did a culture of the wound bed. There is erythema extending from the wound medially on the plantar aspect also on the dorsal aspect. There is no swelling of them MTP joint and no tenderness although he is reasonably insensate Integumentary (Hair, Skin) Wound #1 status is Open. Original cause of wound was Gradually Appeared. The wound is located on the [employer]. The wound measures 0.6cm length x 0.3cm width x 0.4cm depth; 0.141cm^2 area and 0.057cm^3 volume. There is Fat Layer (Subcutaneous Tissue) Exposed exposed. There is no tunneling noted, however, there is undermining starting at 1:00 and ending at 1:00 with a maximum distance of 1.1cm. There is a medium amount of serous drainage noted. Foul odor after cleansing was noted. The wound margin is distinct with the outline attached to the wound base. There is medium (34- 66%) granulation  within the wound bed. There is a medium (34-66%) amount of necrotic tissue within the wound bed including Adherent Slough. The periwound skin appearance exhibited: Callus. The periwound skin appearance did not exhibit: Crepitus, Excoriation, Induration, Rash, Scarring, Dry/Scaly, Maceration, Atrophie Blanche, Cyanosis, Ecchymosis, Hemosiderin Staining, Mottled, Pallor, Rubor, Erythema. Periwound temperature was noted as No  Abnormality. Assessment Active Problems ICD-10 Type 2 diabetes mellitus with foot ulcer Non-pressure chronic ulcer of other part of right foot with necrosis of muscle Type 2 diabetes mellitus with diabetic neuropathy, unspecified [name] [name], [name] [name] ([id number]) Procedures Wound #1 Pre-procedure diagnosis of Wound #1 is a Diabetic Wound/Ulcer of the Lower Extremity located on the Right,Plantar Toe Great .Severity of Tissue Pre Debridement is: Fat layer exposed. There was a Excisional Skin/Subcutaneous Tissue Debridement with a total area of 0.18 sq cm performed by [name] Dillon, MD. With the following instrument(s): Blade, and Forceps to remove Viable and Non-Viable tissue/material. Material removed includes Callus, Subcutaneous Tissue, Slough, Skin: Dermis, and Skin: Epidermis after achieving pain control using Lidocaine. No specimens were taken. A time out was conducted at 14:40, prior to the start of the procedure. A Minimum amount of bleeding was controlled with Pressure. The procedure was tolerated well. Post Debridement Measurements: 2cm length x 2.2cm width x 0.5cm depth; 1.728cm^3 volume. Character of Wound/Ulcer Post Debridement requires further debridement. Severity of Tissue Post Debridement is: Fat layer exposed. Post procedure Diagnosis Wound #1: Same as Pre-Procedure Plan Wound Cleansing: Wound #1 Right,Plantar Toe Great: Clean wound with Normal Saline. Anesthetic (add to Medication List): Wound #1 Right,Plantar Toe Great: Topical Lidocaine 4% cream  applied to wound bed prior to debridement (In Clinic Only). Primary Wound Dressing: Wound #1 Right,Plantar Toe Great: Silver Alginate Secondary Dressing: Wound #1 Right,Plantar Toe Great: Kerlix and Coban Dressing Change Frequency: Three times weekly Follow-up Appointments: Wound #1 Right,Plantar Toe Great: Return Appointment in 1 week. Off-Loading: Wound #1 Right,Plantar Toe Great: Open toe surgical shoe with peg assist. Medications-please add to medication list.: Wound #1 Right,Plantar Toe Great: P.O. Antibiotics Laboratory ordered were: Wound culture routine - Right great toe Radiology ordered were: X-ray, foot - Right great toe The following medication(s) was prescribed: Augmentin oral 875 mg-125 mg tablet 1tablet oral bid for 7days for wouind infection starting 05/24/2018 doxycycline monohydrate oral 100 mg capsule 1 capsule oral bid for 7days for wouind infection starting 05/23/2018 [name] [name], [name] [name] ([id number]) 1. Empiric doxycycline and Augmentin. Very worrisome clinical infection. I have told him that should the erythema extend on the plantar dorsal aspect of the foot he may need to go to the emergency room 2. I have ordered an x-ray of the foot 3. Culture post debridement for CandS 4. I do not think he is offloading this at all. Unfortunately he is not a candidate for any other form of offloading because of gait and balance problems Electronic Signature(s) Signed: 05/23/2018 4:51:45 PM By: [name] Ham MD Entered By: [name] [name] on 05/23/2018 15:28:26 [place], Ramona ([id number]) -------------------------------------------------------------------------------- SuperBill Details Patient Name: [name] [name] Date of Service: 05/23/2018 Medical Record Number: [id number] Patient Account Number: [id number] Date of Birth/Sex: 01-11-1961 (57 y.o. M) Treating RN: [name] [name] Primary Care Provider: Soyla [name] Other Clinician: Referring Provider: Soyla [name] Treating Provider/Extender: [name] [name] in Treatment: 17 Diagnosis Coding ICD-10 Codes Code Description E11.621 Type 2 diabetes mellitus with foot ulcer L97.513 Non-pressure chronic ulcer of other part of right foot with necrosis of muscle E11.40 Type 2 diabetes mellitus with diabetic neuropathy, unspecified Facility Procedures CPT4 Code Description: [id number] 11042 - DEB SUBQ TISSUE 20 SQ CM/< ICD-10 Diagnosis Description L97.513 Non-pressure chronic ulcer of other part of right foot with necr Modifier: osis of muscle Quantity: 1 Physician Procedures CPT4 Code Description: [id number] - WC PHYS SUBQ TISS 20 SQ CM ICD-10 Diagnosis Description L97.513 Non-pressure chronic ulcer of other part of right foot  with necr Modifier: osis of muscle Quantity: 1 Electronic Signature(s) Signed: 05/23/2018 4:51:45 PM By: [name] Ham MD Entered By: [name] [name] on 05/23/2018 15:30:10

## 2018-05-25 NOTE — Progress Notes (Addendum)
PACE, LAMADRID ([id number]) Visit Report for 05/23/2018 Arrival Information Details Patient Name: [name] [name], [name] [name] Date of Service: 05/23/2018 2:00 PM Medical Record Number: [id number] Patient Account Number: [id number] Date of Birth/Sex: 1961/04/01 (57 y.o. M) Treating RN: Montey Hora Primary Care Janette Harvie: Soyla Dryer Other Clinician: Referring Nasri Boakye: Soyla Dryer Treating Nethra Mehlberg/Extender: Tito Dine in Treatment: 79 Visit Information History Since Last Visit Added or deleted any medications: No Patient Arrived: Ambulatory Any new allergies or adverse reactions: No Arrival Time: 14:10 Had a fall or experienced change in No Accompanied By: mother activities of daily living that may affect Transfer Assistance: None risk of falls: Patient Identification Verified: Yes Signs or symptoms of abuse/neglect since last visito No Secondary Verification Process Completed: Yes Hospitalized since last visit: No Implantable device outside of the clinic excluding No cellular tissue based products placed in the center since last visit: Has Dressing in Place as Prescribed: Yes Pain Present Now: No Electronic Signature(s) Signed: 05/23/2018 4:14:25 PM By: Montey Hora Entered By: Montey Hora on 05/23/2018 14:11:21 [place], [place] ([id number]) -------------------------------------------------------------------------------- Lower Extremity Assessment Details Patient Name: [name] [name] Date of Service: 05/23/2018 2:00 PM Medical Record Number: [id number] Patient Account Number: [id number] Date of Birth/Sex: 1961-05-12 (57 y.o. M) Treating RN: Montey Hora Primary Care Symia Herdt: Soyla Dryer Other Clinician: Referring Dustin Burrill: Soyla Dryer Treating Jamarii Banks/Extender: Tito Dine in Treatment: 17 Vascular Assessment Pulses: Dorsalis Pedis Palpable: [Right:Yes] Posterior Tibial Extremity colors, hair growth, and conditions: Extremity  Color: [Right:Normal] Hair Growth on Extremity: [Right:No] Temperature of Extremity: [Right:Warm] Capillary Refill: [Right:< 3 seconds] Toe Nail Assessment Left: Right: Thick: Yes Discolored: Yes Deformed: No Improper Length and Hygiene: Yes Electronic Signature(s) Signed: 05/23/2018 4:14:25 PM By: Montey Hora Entered By: Montey Hora on 05/23/2018 14:22:51 [place], [facility] ([id number]) -------------------------------------------------------------------------------- Multi Wound Chart Details Patient Name: [name] [name] Date of Service: 05/23/2018 2:00 PM Medical Record Number: [id number] Patient Account Number: [id number] Date of Birth/Sex: [birth year] (57 y.o. M) Treating RN: Cornell Barman Primary Care Calob Baskette: Soyla Dryer Other Clinician: Referring Tasheba Henson: Soyla Dryer Treating Saddie Sandeen/Extender: Tito Dine in Treatment: 17 Vital Signs Height(in): 70 Pulse(bpm): 8 Weight(lbs): 139 Blood Pressure(mmHg): 123/58 Body Mass Index(BMI): 20 Temperature(F): 98.2 Respiratory Rate 18 (breaths/min): Photos: [1:No Photos] [N/A:N/A] Wound Location: [1:Right Toe Great - Plantar] [N/A:N/A] Wounding Event: [1:Gradually Appeared] [N/A:N/A] Primary Etiology: [1:Diabetic Wound/Ulcer of the Lower Extremity] [N/A:N/A] Comorbid History: [1:Cataracts, Hypertension, Type II Diabetes, Rheumatoid Arthritis, Neuropathy] [N/A:N/A] Date Acquired: [1:12/24/2017] [N/A:N/A] Weeks of Treatment: [1:17] [N/A:N/A] Wound Status: [1:Open] [N/A:N/A] Measurements L x W x D [1:0.6x0.3x0.4] [N/A:N/A] (cm) Area (cm) : [1:0.141] [N/A:N/A] Volume (cm) : [1:0.057] [N/A:N/A] % Reduction in Area: [1:74.40%] [N/A:N/A] % Reduction in Volume: [1:82.70%] [N/A:N/A] Starting Position 1 [1:1] (o'clock): Ending Position 1 [1:1] (o'clock): Maximum Distance 1 (cm): [1:1.1] Undermining: [1:Yes] [N/A:N/A] Classification: [1:Grade 2] [N/A:N/A] Exudate Amount: [1:Medium] [N/A:N/A] Exudate  Type: [1:Serous] [N/A:N/A] Exudate Color: [1:amber] [N/A:N/A] Foul Odor After Cleansing: [1:Yes] [N/A:N/A] Odor Anticipated Due to [1:No] [N/A:N/A] Product Use: Wound Margin: [1:Distinct, outline attached] [N/A:N/A] Granulation Amount: [1:Medium (34-66%)] [N/A:N/A] Necrotic Amount: [1:Medium (34-66%)] [N/A:N/A] Exposed Structures: [1:Fat Layer (Subcutaneous Tissue) Exposed: Yes Fascia: No Tendon: No] [N/A:N/A] Muscle: No Joint: No Bone: No Epithelialization: None N/A N/A Debridement: Debridement - Excisional N/A N/A Pre-procedure 14:40 N/A N/A Verification/Time Out Taken: Pain Control: Lidocaine N/A N/A Tissue Debrided: Callus, Subcutaneous, Slough N/A N/A Level: Skin/Subcutaneous Tissue N/A N/A Debridement Area (sq cm): 0.18 N/A N/A Instrument: Blade, Forceps N/A N/A Bleeding: Minimum N/A N/A Hemostasis Achieved: Pressure N/A N/A Debridement Treatment Procedure  was tolerated well N/A N/A Response: Post Debridement 2x2.2x0.5 N/A N/A Measurements L x W x D (cm) Post Debridement Volume: 1.728 N/A N/A (cm) Periwound Skin Texture: Callus: Yes N/A N/A Excoriation: No Induration: No Crepitus: No Rash: No Scarring: No Periwound Skin Moisture: Maceration: No N/A N/A Dry/Scaly: No Periwound Skin Color: Atrophie Blanche: No N/A N/A Cyanosis: No Ecchymosis: No Erythema: No Hemosiderin Staining: No Mottled: No Pallor: No Rubor: No Temperature: No Abnormality N/A N/A Tenderness on Palpation: No N/A N/A Wound Preparation: Ulcer Cleansing: N/A N/A Rinsed/Irrigated with Saline Topical Anesthetic Applied: Other: lidocaine 4% Procedures Performed: Debridement N/A N/A Treatment Notes Electronic Signature(s) Signed: 05/23/2018 4:51:45 PM By: Linton Ham MD Entered By: Linton Ham on 05/23/2018 15:23:20 [place], [place] ([id number]) -------------------------------------------------------------------------------- Multi-Disciplinary Care Plan Details Patient Name:  [name] [name] Date of Service: 05/23/2018 2:00 PM Medical Record Number: [id number] Patient Account Number: [id number] Date of Birth/Sex: [birth year] (57 y.o. M) Treating RN: Cornell Barman Primary Care Aziah Brostrom: Soyla Dryer Other Clinician: Referring Annalisia Ingber: Soyla Dryer Treating Tommaso Cavitt/Extender: Tito Dine in Treatment: 17 Active Inactive Necrotic Tissue Nursing Diagnoses: Knowledge deficit related to management of necrotic/devitalized tissue Goals: Necrotic/devitalized tissue will be minimized in the wound bed Date Initiated: 01/24/2018 Target Resolution Date: 02/09/2018 Goal Status: Active Interventions: Assess patient pain level pre-, during and post procedure and prior to discharge Treatment Activities: Excisional debridement : 01/24/2018 Notes: Orientation to the Wound Care Program Nursing Diagnoses: Knowledge deficit related to the wound healing center program Goals: Patient/caregiver will verbalize understanding of the [place] Date Initiated: 01/24/2018 Target Resolution Date: 02/09/2018 Goal Status: Active Interventions: Provide education on orientation to the wound center Notes: Pressure Nursing Diagnoses: Knowledge deficit related to management of pressures ulcers Goals: Patient will remain free from development of additional pressure ulcers Date Initiated: 01/24/2018 Target Resolution Date: 02/09/2018 Goal Status: Active [place], [place]ne ([id number]) Interventions: Assess potential for pressure ulcer upon admission and as needed Notes: Wound/Skin Impairment Nursing Diagnoses: Knowledge deficit related to ulceration/compromised skin integrity Goals: Ulcer/skin breakdown will have a volume reduction of 30% by week 4 Date Initiated: 01/24/2018 Target Resolution Date: 02/24/2018 Goal Status: Active Interventions: Assess ulceration(s) every visit Treatment Activities: Topical wound management initiated :  01/24/2018 Notes: Electronic Signature(s) Signed: 05/23/2018 5:09:03 PM By: Gretta Cool, BSN, RN, CWS, Kim RN, BSN Entered By: Gretta Cool, BSN, RN, CWS, Kim on 05/23/2018 14:41:45 [place], [place] ([id number]) -------------------------------------------------------------------------------- Non-Wound Condition Assessment Details Patient Name: [name] [name] Date of Service: 05/23/2018 2:00 PM Medical Record Number: [id number] Patient Account Number: [id number] Date of Birth/Sex: [birth year] (57 y.o. M) Treating RN: Montey Hora Primary Care Toy Eisemann: Soyla Dryer Other Clinician: Referring Codi Folkerts: Soyla Dryer Treating Nealy Hickmon/Extender: Tito Dine in Treatment: 17 Non-Wound Condition: Condition: Other Dermatologic Condition Location: Foot Side: Right Photos Photo Uploaded By: Montey Hora on 05/23/2018 16:05:56 Periwound Skin Texture Texture Color No Abnormalities Noted: No No Abnormalities Noted: No Moisture No Abnormalities Noted: No Notes patient has a blister on his right plantar great toe Electronic Signature(s) Signed: 05/23/2018 4:14:25 PM By: Montey Hora Entered By: Montey Hora on 05/23/2018 14:23:50 [place], [place] ([id number]) -------------------------------------------------------------------------------- Pain Assessment Details Patient Name: [name] [name] Date of Service: 05/23/2018 2:00 PM Medical Record Number: [id number] Patient Account Number: [id number] Date of Birth/Sex: May 19, 1961 (57 y.o. M) Treating RN: Montey Hora Primary Care Davin Archuletta: Soyla Dryer Other Clinician: Referring Leeandra Ellerson: Soyla Dryer Treating Noura Purpura/Extender: Tito Dine in Treatment: 17 Active Problems Location of Pain Severity and Description of Pain Patient Has Paino No Site Locations Pain Management  and Medication Current Pain Management: Electronic Signature(s) Signed: 05/23/2018 4:14:25 PM By: Montey Hora Entered By: Montey Hora  on 05/23/2018 14:11:26 [place], [place] ([id number]) -------------------------------------------------------------------------------- Patient/Caregiver Education Details Patient Name: [name] [name] Date of Service: 05/23/2018 2:00 PM Medical Record Number: [id number] Patient Account Number: [id number] Date of Birth/Gender: [birth year] (57 y.o. M) Treating RN: Cornell Barman Primary Care Physician: Soyla Dryer Other Clinician: Referring Physician: Soyla Dryer Treating Physician/Extender: Tito Dine in Treatment: 17 Education Assessment Education Provided To: Patient Education Topics Provided Elevated Blood Sugar/ Impact on Healing: Handouts: Elevated Blood Sugars: How Do They Affect Wound Healing Methods: Demonstration, Explain/Verbal Responses: State content correctly Pressure: Handouts: Pressure Ulcers: Care and Offloading, Other: Keep pressure off of toe Methods: Demonstration, Explain/Verbal Responses: State content correctly Wound/Skin Impairment: Handouts: Caring for Your Ulcer Methods: Demonstration, Explain/Verbal Responses: State content correctly Electronic Signature(s) Signed: 05/23/2018 5:09:03 PM By: Gretta Cool, BSN, RN, CWS, Kim RN, BSN Entered By: Gretta Cool, BSN, RN, CWS, Kim on 05/23/2018 15:05:10 [place], Amanda ([id number]) -------------------------------------------------------------------------------- Wound Assessment Details Patient Name: [name] [name] Date of Service: 05/23/2018 2:00 PM Medical Record Number: [id number] Patient Account Number: [id number] Date of Birth/Sex: 01/30/1961 (57 y.o. M) Treating RN: Montey Hora Primary Care Antonie Borjon: Soyla Dryer Other Clinician: Referring Cheyenna Pankowski: Soyla Dryer Treating Haislee Corso/Extender: Tito Dine in Treatment: 17 Wound Status Wound Number: 1 Primary Diabetic Wound/Ulcer of the Lower Extremity Etiology: Wound Location: Right Toe Great - Plantar Wound Open Wounding Event:  Gradually Appeared Status: Date Acquired: 12/24/2017 Comorbid Cataracts, Hypertension, Type II Diabetes, Weeks Of Treatment: 17 History: Rheumatoid Arthritis, Neuropathy Clustered Wound: No Photos Wound Measurements Length: (cm) 0.6 % Reductio Width: (cm) 0.3 % Reductio Depth: (cm) 0.4 Epithelial Area: (cm) 0.141 Tunneling Volume: (cm) 0.057 Undermini Startin Ending Maximum n in Area: 74.4% n in Volume: 82.7% ization: None : No ng: Yes g Position (o'clock): 1 Position (o'clock): 1 Distance: (cm) 1.1 Wound Description Classification: Grade 2 Foul Odor Wound Margin: Distinct, outline attached Due to Pr Exudate Amount: Medium Slough/Fi Exudate Type: Serous Exudate Color: amber After Cleansing: Yes oduct Use: No brino Yes Wound Bed Granulation Amount: Medium (34-66%) Exposed Structure Necrotic Amount: Medium (34-66%) Fascia Exposed: No Necrotic Quality: Adherent Slough Fat Layer (Subcutaneous Tissue) Exposed: Yes Tendon Exposed: No Muscle Exposed: No Joint Exposed: No Gerdeman, Dravon ([id number]) Bone Exposed: No Periwound Skin Texture Texture Color No Abnormalities Noted: No No Abnormalities Noted: No Callus: Yes Atrophie Blanche: No Crepitus: No Cyanosis: No Excoriation: No Ecchymosis: No Induration: No Erythema: No Rash: No Hemosiderin Staining: No Scarring: No Mottled: No Pallor: No Moisture Rubor: No No Abnormalities Noted: No Dry / Scaly: No Temperature / Pain Maceration: No Temperature: No Abnormality Wound Preparation Ulcer Cleansing: Rinsed/Irrigated with Saline Topical Anesthetic Applied: Other: lidocaine 4%, Electronic Signature(s) Signed: 05/23/2018 5:10:28 PM By: Gretta Cool, BSN, RN, CWS, Kim RN, BSN Signed: 05/25/2018 5:23:14 PM By: Montey Hora Previous Signature: 05/23/2018 4:14:25 PM Version By: Montey Hora Entered By: Gretta Cool BSN, RN, CWS, Kim on 05/23/2018 17:10:28 [place], [place]  ([id number]) -------------------------------------------------------------------------------- [place] Details Patient Name: [name] [name] Date of Service: 05/23/2018 2:00 PM Medical Record Number: [id number] Patient Account Number: [id number] Date of Birth/Sex: Dec 12, 1960 (57 y.o. M) Treating RN: Montey Hora Primary Care Elizandro Laura: Soyla Dryer Other Clinician: Referring Reeta Kuk: Soyla Dryer Treating Colton Engdahl/Extender: Tito Dine in Treatment: 17 Vital Signs Time Taken: 14:13 Temperature (F): 98.2 Height (in): 70 Pulse (bpm): 92 Weight (lbs): 139 Respiratory Rate (breaths/min): 18 Body Mass Index (BMI): 19.9 Blood Pressure (mmHg): 123/58 Reference Range: 80 -  120 mg / dl Electronic Signature(s) Signed: 05/23/2018 4:14:25 PM By: Montey Hora Entered By: Montey Hora on 05/23/2018 14:13:51

## 2018-05-29 LAB — AEROBIC/ANAEROBIC CULTURE W GRAM STAIN (SURGICAL/DEEP WOUND)

## 2018-05-29 LAB — AEROBIC/ANAEROBIC CULTURE (SURGICAL/DEEP WOUND)

## 2018-05-30 ENCOUNTER — Encounter: Payer: Self-pay | Admitting: Internal Medicine

## 2018-05-30 ENCOUNTER — Other Ambulatory Visit (HOSPITAL_BASED_OUTPATIENT_CLINIC_OR_DEPARTMENT_OTHER): Payer: Self-pay | Admitting: Internal Medicine

## 2018-05-30 DIAGNOSIS — L97509 Non-pressure chronic ulcer of other part of unspecified foot with unspecified severity: Principal | ICD-10-CM

## 2018-05-30 DIAGNOSIS — L97513 Non-pressure chronic ulcer of other part of right foot with necrosis of muscle: Secondary | ICD-10-CM

## 2018-05-30 DIAGNOSIS — E13621 Other specified diabetes mellitus with foot ulcer: Secondary | ICD-10-CM

## 2018-05-31 NOTE — Progress Notes (Signed)
KELL, FERRIS ([id number]) Visit Report for 05/30/2018 Arrival Information Details Patient Name: [name] [name], [name] [name] Date of Service: 05/30/2018 2:15 PM Medical Record Number: [id number] Patient Account Number: [id number] Date of Birth/Sex: 1960-12-04 (57 y.o. M) Treating RN: Secundino Ginger Primary Care Kyona Chauncey: Soyla Dryer Other Clinician: Referring Desi Rowe: Soyla Dryer Treating Ahmaad Neidhardt/Extender: Tito Dine in Treatment: 18 Visit Information History Since Last Visit Added or deleted any medications: No Patient Arrived: Ambulatory Any new allergies or adverse reactions: No Arrival Time: 14:03 Had a fall or experienced change in No Accompanied By: spouse activities of daily living that may affect Transfer Assistance: None risk of falls: Patient Identification Verified: Yes Signs or symptoms of abuse/neglect since last visito No Secondary Verification Process Completed: Yes Hospitalized since last visit: No Implantable device outside of the clinic excluding No cellular tissue based products placed in the center since last visit: Has Dressing in Place as Prescribed: Yes Pain Present Now: No Electronic Signature(s) Signed: 05/30/2018 4:43:50 PM By: Secundino Ginger Entered By: Secundino Ginger on 05/30/2018 [place], [place] ([id number]) -------------------------------------------------------------------------------- Clinic Level of Care Assessment Details Patient Name: [name] [name] Date of Service: 05/30/2018 2:15 PM Medical Record Number: [id number] Patient Account Number: [id number] Date of Birth/Sex: [birth year] (57 y.o. M) Treating RN: Cornell Barman Primary Care Jinnie Onley: Soyla Dryer Other Clinician: Referring Farris Geiman: Soyla Dryer Treating Chere Babson/Extender: Tito Dine in Treatment: 18 Clinic Level of Care Assessment Items TOOL 4 Quantity Score []  - Use when only an EandM is performed on FOLLOW-UP visit 0 ASSESSMENTS - Nursing Assessment /  Reassessment []  - Reassessment of Co-morbidities (includes updates in patient status) 0 X- 1 5 Reassessment of Adherence to Treatment Plan ASSESSMENTS - Wound and Skin Assessment / Reassessment X - Simple Wound Assessment / Reassessment - one wound 1 5 []  - 0 Complex Wound Assessment / Reassessment - multiple wounds []  - 0 Dermatologic / Skin Assessment (not related to wound area) ASSESSMENTS - Focused Assessment []  - Circumferential Edema Measurements - multi extremities 0 []  - 0 Nutritional Assessment / Counseling / Intervention []  - 0 Lower Extremity Assessment (monofilament, tuning fork, pulses) []  - 0 Peripheral Arterial Disease Assessment (using hand held doppler) ASSESSMENTS - Ostomy and/or Continence Assessment and Care []  - Incontinence Assessment and Management 0 []  - 0 Ostomy Care Assessment and Management (repouching, etc.) PROCESS - Coordination of Care X - Simple Patient / Family Education for ongoing care 1 15 []  - 0 Complex (extensive) Patient / Family Education for ongoing care []  - 0 Staff obtains Programmer, systems, Records, Test Results / Process Orders []  - 0 Staff telephones HHA, Nursing Homes / Clarify orders / etc []  - 0 Routine Transfer to another Facility (non-emergent condition) []  - 0 Routine Hospital Admission (non-emergent condition) []  - 0 New Admissions / Biomedical engineer / Ordering NPWT, Apligraf, etc. []  - 0 Emergency Hospital Admission (emergent condition) X- 1 10 Simple Discharge Coordination Hovis, Lory ([id number]) []  - 0 Complex (extensive) Discharge Coordination PROCESS - Special Needs []  - Pediatric / Minor Patient Management 0 []  - 0 Isolation Patient Management []  - 0 Hearing / Language / Visual special needs []  - 0 Assessment of Community assistance (transportation, D/C planning, etc.) []  - 0 Additional assistance / Altered mentation []  - 0 Support Surface(s) Assessment (bed, cushion, seat, etc.) INTERVENTIONS - Wound  Cleansing / Measurement X - Simple Wound Cleansing - one wound 1 5 []  - 0 Complex Wound Cleansing - multiple wounds X- 1 5 Wound Imaging (photographs - any number of wounds) []  -  0 Wound Tracing (instead of photographs) X- 1 5 Simple Wound Measurement - one wound []  - 0 Complex Wound Measurement - multiple wounds INTERVENTIONS - Wound Dressings []  - Small Wound Dressing one or multiple wounds 0 X- 1 15 Medium Wound Dressing one or multiple wounds []  - 0 Large Wound Dressing one or multiple wounds []  - 0 Application of Medications - topical []  - 0 Application of Medications - injection INTERVENTIONS - Miscellaneous []  - External ear exam 0 []  - 0 Specimen Collection (cultures, biopsies, blood, body fluids, etc.) []  - 0 Specimen(s) / Culture(s) sent or taken to Lab for analysis []  - 0 Patient Transfer (multiple staff / Civil Service fast streamer / Similar devices) []  - 0 Simple Staple / Suture removal (25 or less) []  - 0 Complex Staple / Suture removal (26 or more) []  - 0 Hypo / Hyperglycemic Management (close monitor of Blood Glucose) []  - 0 Ankle / Brachial Index (ABI) - do not check if billed separately X- 1 5 Vital Signs Ranes, Shiva ([id number]) Has the patient been seen at the hospital within the last three years: Yes Total Score: 70 Level Of Care: New/Established - Level 2 Electronic Signature(s) Signed: 05/30/2018 5:32:05 PM By: Gretta Cool, BSN, RN, CWS, Kim RN, BSN Entered By: Gretta Cool, BSN, RN, CWS, Kim on 05/30/2018 14:26:23 BRYKER, FLETCHALL ([id number]) -------------------------------------------------------------------------------- Encounter Discharge Information Details Patient Name: [name] [name] Date of Service: 05/30/2018 2:15 PM Medical Record Number: [id number] Patient Account Number: [id number] Date of Birth/Sex: 26-Apr-1961 (57 y.o. M) Treating RN: Secundino Ginger Primary Care Christin Mccreedy: Soyla Dryer Other Clinician: Referring Keanon Bevins: Soyla Dryer Treating  Jader Desai/Extender: Tito Dine in Treatment: 5 Encounter Discharge Information Items Discharge Condition: Stable Ambulatory Status: Ambulatory Discharge Destination: Home Transportation: Private Auto Accompanied By: family Schedule Follow-up Appointment: Yes Clinical Summary of Care: Electronic Signature(s) Signed: 05/30/2018 4:43:50 PM By: Secundino Ginger Entered By: Secundino Ginger on 05/30/2018 14:45:01 Clodfelter, Gagan ([id number]) -------------------------------------------------------------------------------- Lower Extremity Assessment Details Patient Name: [name] [name] Date of Service: 05/30/2018 2:15 PM Medical Record Number: [id number] Patient Account Number: [id number] Date of Birth/Sex: August 10, 1960 (57 y.o. M) Treating RN: Secundino Ginger Primary Care Joei Frangos: Soyla Dryer Other Clinician: Referring Patte Winkel: Soyla Dryer Treating Taiten Brawn/Extender: Tito Dine in Treatment: 18 Vascular Assessment Claudication: Claudication Assessment [Right:None] Pulses: Dorsalis Pedis Palpable: [Right:Yes] Posterior Tibial Extremity colors, hair growth, and conditions: Extremity Color: [Right:Normal] Hair Growth on Extremity: [Right:No] Temperature of Extremity: [Right:Warm] Capillary Refill: [Right:< 3 seconds] Toe Nail Assessment Left: Right: Thick: No Discolored: No Deformed: No Improper Length and Hygiene: Yes Electronic Signature(s) Signed: 05/30/2018 4:43:50 PM By: Secundino Ginger Entered By: Secundino Ginger on 05/30/2018 14:10:42 [place], Cannon ([id number]) -------------------------------------------------------------------------------- Multi Wound Chart Details Patient Name: [name] [name] Date of Service: 05/30/2018 2:15 PM Medical Record Number: [id number] Patient Account Number: [id number] Date of Birth/Sex: 12-Aug-1960 (57 y.o. M) Treating RN: Cornell Barman Primary Care Malerie Eakins: Soyla Dryer Other Clinician: Referring Zaniah Titterington: Soyla Dryer Treating  Nason Conradt/Extender: Tito Dine in Treatment: 18 Vital Signs Height(in): 70 Pulse(bpm): 96 Weight(lbs): 139 Blood Pressure(mmHg): 138/82 Body Mass Index(BMI): 20 Temperature(F): 97.9 Respiratory Rate 16 (breaths/min): Photos: [N/A:N/A] Wound Location: Right Toe Great - Plantar N/A N/A Wounding Event: Gradually Appeared N/A N/A Primary Etiology: Diabetic Wound/Ulcer of the N/A N/A Lower Extremity Comorbid History: Cataracts, Hypertension, Type N/A N/A II Diabetes, Rheumatoid Arthritis, Neuropathy Date Acquired: 12/24/2017 N/A N/A Weeks of Treatment: 18 N/A N/A Wound Status: Open N/A N/A Measurements L x W x D 0.5x0.3x0.3 N/A N/A (cm) Area (cm) : 0.118 N/A  N/A Volume (cm) : 0.035 N/A N/A % Reduction in Area: 78.50% N/A N/A % Reduction in Volume: 89.40% N/A N/A Classification: Grade 2 N/A N/A Exudate Amount: Small N/A N/A Exudate Type: Serous N/A N/A Exudate Color: amber N/A N/A Foul Odor After Cleansing: Yes N/A N/A Odor Anticipated Due to No N/A N/A Product Use: Wound Margin: Distinct, outline attached N/A N/A Granulation Amount: None Present (0%) N/A N/A Necrotic Amount: Small (1-33%) N/A N/A Exposed Structures: Fat Layer (Subcutaneous N/A N/A Tissue) Exposed: Yes Fascia: No [place], Makana ([id number]) Tendon: No Muscle: No Joint: No Bone: No Epithelialization: None N/A N/A Periwound Skin Texture: Callus: Yes N/A N/A Excoriation: No Induration: No Crepitus: No Rash: No Scarring: No Periwound Skin Moisture: Maceration: No N/A N/A Dry/Scaly: No Periwound Skin Color: Atrophie Blanche: No N/A N/A Cyanosis: No Ecchymosis: No Erythema: No Hemosiderin Staining: No Mottled: No Pallor: No Rubor: No Temperature: No Abnormality N/A N/A Tenderness on Palpation: No N/A N/A Wound Preparation: Ulcer Cleansing: N/A N/A Rinsed/Irrigated with Saline Topical Anesthetic Applied: Other: lidocaine 4% Treatment Notes Electronic Signature(s) Signed:  05/30/2018 5:55:34 PM By: Linton Ham MD Entered By: Linton Ham on 05/30/2018 14:33:57 Adrian, Marvin ([id number]) -------------------------------------------------------------------------------- Multi-Disciplinary Care Plan Details Patient Name: [name] [name] Date of Service: 05/30/2018 2:15 PM Medical Record Number: [id number] Patient Account Number: [id number] Date of Birth/Sex: 10-May-1961 (57 y.o. M) Treating RN: Cornell Barman Primary Care Karlo Goeden: Soyla Dryer Other Clinician: Referring Jakyiah Briones: Soyla Dryer Treating Hriday Stai/Extender: Tito Dine in Treatment: 18 Active Inactive Necrotic Tissue Nursing Diagnoses: Knowledge deficit related to management of necrotic/devitalized tissue Goals: Necrotic/devitalized tissue will be minimized in the wound bed Date Initiated: 01/24/2018 Target Resolution Date: 02/09/2018 Goal Status: Active Interventions: Assess patient pain level pre-, during and post procedure and prior to discharge Treatment Activities: Excisional debridement : 01/24/2018 Notes: Orientation to the Wound Care Program Nursing Diagnoses: Knowledge deficit related to the wound healing center program Goals: Patient/caregiver will verbalize understanding of the [place] Date Initiated: 01/24/2018 Target Resolution Date: 02/09/2018 Goal Status: Active Interventions: Provide education on orientation to the wound center Notes: Pressure Nursing Diagnoses: Knowledge deficit related to management of pressures ulcers Goals: Patient will remain free from development of additional pressure ulcers Date Initiated: 01/24/2018 Target Resolution Date: 02/09/2018 Goal Status: Active [place], [place] ([id number]) Interventions: Assess potential for pressure ulcer upon admission and as needed Notes: Wound/Skin Impairment Nursing Diagnoses: Knowledge deficit related to ulceration/compromised skin integrity Goals: Ulcer/skin breakdown  will have a volume reduction of 30% by week 4 Date Initiated: 01/24/2018 Target Resolution Date: 02/24/2018 Goal Status: Active Interventions: Assess ulceration(s) every visit Treatment Activities: Topical wound management initiated : 01/24/2018 Notes: Electronic Signature(s) Signed: 05/30/2018 5:32:05 PM By: Gretta Cool, BSN, RN, CWS, Kim RN, BSN Entered By: Gretta Cool, BSN, RN, CWS, Kim on 05/30/2018 14:20:44 [place], Kiyoshi ([id number]) -------------------------------------------------------------------------------- Pain Assessment Details Patient Name: [name] [name] Date of Service: 05/30/2018 2:15 PM Medical Record Number: [id number] Patient Account Number: [id number] Date of Birth/Sex: 05-24-1961 (57 y.o. M) Treating RN: Secundino Ginger Primary Care Hanaan Gancarz: Soyla Dryer Other Clinician: Referring Fischer Halley: Soyla Dryer Treating Kamyiah Colantonio/Extender: Tito Dine in Treatment: 18 Active Problems Location of Pain Severity and Description of Pain Patient Has Paino No Site Locations Pain Management and Medication Current Pain Management: Goals for Pain Management pt denies any pain at this time. Electronic Signature(s) Signed: 05/30/2018 4:43:50 PM By: Secundino Ginger Entered By: Secundino Ginger on 05/30/2018 14:04:50 [place], Slinger ([id number]) -------------------------------------------------------------------------------- Patient/Caregiver Education Details Patient Name: [name] [name] Date of Service: 05/30/2018  2:15 PM Medical Record Number: [id number] Patient Account Number: [id number] Date of Birth/Gender: 1960/11/16 (57 y.o. M) Treating RN: Cornell Barman Primary Care Physician: Soyla Dryer Other Clinician: Referring Physician: Soyla Dryer Treating Physician/Extender: Tito Dine in Treatment: 35 Education Assessment Education Provided To: Patient Education Topics Provided Wound/Skin Impairment: Handouts: Caring for Your Ulcer, Other: Continue wound care  as prescribed Methods: Demonstration, Explain/Verbal Responses: State content correctly Electronic Signature(s) Signed: 05/30/2018 4:43:50 PM By: Secundino Ginger Entered By: Secundino Ginger on 05/30/2018 14:45:10 [place], Kellan ([id number]) -------------------------------------------------------------------------------- Wound Assessment Details Patient Name: [name] [name] Date of Service: 05/30/2018 2:15 PM Medical Record Number: [id number] Patient Account Number: [id number] Date of Birth/Sex: January 06, 1961 (57 y.o. M) Treating RN: Secundino Ginger Primary Care Kwinton Maahs: Soyla Dryer Other Clinician: Referring Virginio Isidore: Soyla Dryer Treating Gwynne Kemnitz/Extender: Tito Dine in Treatment: 18 Wound Status Wound Number: 1 Primary Diabetic Wound/Ulcer of the Lower Extremity Etiology: Wound Location: Right Toe Great - Plantar Wound Open Wounding Event: Gradually Appeared Status: Date Acquired: 12/24/2017 Comorbid Cataracts, Hypertension, Type II Diabetes, Weeks Of Treatment: 18 History: Rheumatoid Arthritis, Neuropathy Clustered Wound: No Photos Photo Uploaded By: Secundino Ginger on 05/30/2018 14:30:35 Wound Measurements Length: (cm) 0.5 % Reductio Width: (cm) 0.3 % Reductio Depth: (cm) 0.3 Epithelial Area: (cm) 0.118 Tunneling Volume: (cm) 0.035 Undermini n in Area: 78.5% n in Volume: 89.4% ization: None : No ng: No Wound Description Classification: Grade 2 Foul Odor Wound Margin: Distinct, outline attached Due to Pr Exudate Amount: Small Slough/Fi Exudate Type: Serous Exudate Color: amber After Cleansing: Yes oduct Use: No brino Yes Wound Bed Granulation Amount: None Present (0%) Exposed Structure Necrotic Amount: Small (1-33%) Fascia Exposed: No Necrotic Quality: Adherent Slough Fat Layer (Subcutaneous Tissue) Exposed: Yes Tendon Exposed: No Muscle Exposed: No Joint Exposed: No Bone Exposed: No Periwound Skin Texture Soledad, Muzammil ([id number]) Texture Color No  Abnormalities Noted: No No Abnormalities Noted: No Callus: Yes Atrophie Blanche: No Crepitus: No Cyanosis: No Excoriation: No Ecchymosis: No Induration: No Erythema: No Rash: No Hemosiderin Staining: No Scarring: No Mottled: No Pallor: No Moisture Rubor: No No Abnormalities Noted: No Dry / Scaly: No Temperature / Pain Maceration: No Temperature: No Abnormality Wound Preparation Ulcer Cleansing: Rinsed/Irrigated with Saline Topical Anesthetic Applied: Other: lidocaine 4%, Treatment Notes Wound #1 (Right, Plantar Toe Great) 1. Cleansed with: Clean wound with Normal Saline 2. Anesthetic Topical Lidocaine 4% cream to wound bed prior to debridement 4. Dressing Applied: Dry Gauze 5. Secondary Dressing Applied Kerlix/Conform 7. Secured with Tape Notes silvercel, foam, secured with conform and tape, darco front Dietitian) Signed: 05/30/2018 4:43:50 PM By: Secundino Ginger Entered By: Secundino Ginger on 05/30/2018 14:10:00 [place], Kenzel ([id number]) -------------------------------------------------------------------------------- Vitals Details Patient Name: [name] [name] Date of Service: 05/30/2018 2:15 PM Medical Record Number: [id number] Patient Account Number: [id number] Date of Birth/Sex: 11-May-1961 (57 y.o. M) Treating RN: Secundino Ginger Primary Care Thomasine Klutts: Soyla Dryer Other Clinician: Referring Kionna Brier: Soyla Dryer Treating Finley Chevez/Extender: Tito Dine in Treatment: 18 Vital Signs Time Taken: 14:05 Temperature (F): 97.9 Height (in): 70 Pulse (bpm): 96 Weight (lbs): 139 Respiratory Rate (breaths/min): 16 Body Mass Index (BMI): 19.9 Blood Pressure (mmHg): 138/82 Reference Range: 80 - 120 mg / dl Electronic Signature(s) Signed: 05/30/2018 4:43:50 PM By: Secundino Ginger Entered By: Secundino Ginger on 05/30/2018 14:05:32

## 2018-05-31 NOTE — Progress Notes (Signed)
LENA, FIELDHOUSE ([id number]) Visit Report for 05/30/2018 HPI Details Patient Name: [name] [name], [name] [name] Date of Service: 05/30/2018 2:15 PM Medical Record Number: [id number] Patient Account Number: [id number] Date of Birth/Sex: 13-Sep-1960 (56 y.o. M) Treating RN: Cornell Barman Primary Care Provider: Soyla Dryer Other Clinician: Referring Provider: Soyla Dryer Treating Provider/Extender: Tito Dine in Treatment: 18 History of Present Illness HPI Description: 01/24/18-He is seen in initial evaluation for right great toe ulcer. He was sent to the emergency room on 718 from the nurse clinic office for new-onset diabetes with an A1c of 12.9. He was discharged from the emergency room on metformin, had a right foot x-ray that was negative for osteomyelitis and had a follow-up with his primary care at the free clinic on 7/22. He has been treated twice with antibiotic therapy; bactrim (7/8) and doxycycline (7/18). He is neuropathic. He tolerated debridement; bone is palpable through a thin layer of tissue; there is, what appears to be, resolving erythema and edema; a culture was taken, antibiotics will be prescribed as needed based on sensitivities. 01/31/18; this is a patient with new onset type 2 diabetes who is on oral agents. Culture from last time showed a few Klebsiella and a few enterococcus is being appropriately treated with a combination of Augmentin and ciprofloxacin for 10 days which I think he just started yesterday. He has to get his medications through the free clinic in [place]. He has no insurance. Been using Prisma to the wound. Previous x-ray last month was negative for osteomyelitis.his ABIs look reasonable. He has an offloading sandal 02/07/18; he completes his antibiotics which are Augmentin and ciprofloxacin on Saturday. Using silver collagen. Once again he has callus around the wound and not a very viable looking wound surface. We have been giving him an offloading  sandal but I think he is going to require more offloading than that. Previous x-ray was negative. ABIs in our clinic quite reasonable 02/14/18; he arrives today with the wound dimensions slightly smaller. Still callus nonviable subcutaneous tissue around the wound circumference. He is still in a offloading shoe rather than the Darco. I transitioned him into the Darco today 02/21/18; slight improvement in dimensions. Still aggressive debridements using Prisma. He is using a Darco forefoot offloading boot 02/28/18; not much improvement. Still requiring aggressive debridement using Prisma. I changed him to Iodoflex to help with the wound debridement. He is using a Darco forefoot offloading boot but tells me he had 1 fall this week. There is not an option for more aggressive offloading. x-ray I ordered last week was negative for osteomyelitis 03/07/18; not much improvement. He arrives with thick skin subcutaneous tissue overhanging the wound covered and callus. This requires debridement. Post debridement the wound surface looks pretty good. As usual have thoughts about of total contact cast but after talking to the patient and his mother and observing the patient leave the clinic there is absolutely no way he could tolerate a total contact cast. He is barely able to maintain balance and the Darco forefoot boot. I suspect this is secondary to diabetic neuropathy 03/14/18; better looking wound today. Plantar aspect of the right great toe. Still requires debridement but less adherent callus nonviable tissue and less debris over the wound surface. He is tolerating the Darco forefoot boot marginally, not a candidate for a TCC. 03/21/18; wound looks a lot better. Still some depth although the base of the wound looks healthy. He still has a fair amount or raised callus which requires debridement. He is tolerating  the Darco forefoot boot. He is not a candidate for a TCC secondary to gait imbalance issues 04/04/18;  Patient's wound looked a lot better 2 weeks ago. This week necrotic debris over the surface with considerable undermining. He's been using silver collagen. Our intake nurse picked up that he may not been using the Darco forefoot off loader secondary to balance issues. We put him back in a surgical shoe today 04/11/18; some improvement in this wound. He still comes in with callus thick skin and subcutaneous tissue over the wound circumference however debriding this today shows it looks better. We put him back in a surgical shoe last week because he had had some falls with a Darco and we were convinced he was using the Darco in any case. We've been using Endoform that we started last week 04/25/18. Wound is generally improved. We are using Endoform.He is offloading this better using a surgical shoe. He cannot tolerate any more aggressive offloading secondary to balance issues 05/02/18; I changed him to Endoform 2 weeks ago and last week this looked better. However he arrives in clinic today with [place], [place] ([id number]) absolutely no improvement. Still requiring debridement. Interestingly it looks as though the great toe had punched a hole in the surface of his healing sandal 05/23/2018; the patient has not been here in 3 weeks. Once again he arrives with a punched hole through his offloading healing sandal. I have not really seen this let alone twice in 2 visits. He also has a considerable amount of erythema in the plantar right great toe as well as dorsally. There is swelling and I suspect active infection. Previously he grew enterococcus and Klebsiella out of this wound some months ago. The Klebsiella was Augmentin resistant. He has not been systemically unwell. He claims to be keeping off this but I am doubtful this is the case 05/30/2018; he arrives today with his toes looking better. He is completed the Augmentin and doxycycline I gave him empirically culture of the wound grew Haemophilus  parainfluenza beta-lactamase negative and methicillin sensitive staph aureus. The x-ray suggested a progressive lucency involving the head of the first metatarsal bone which is NOT the exact area where the wound is located. Nevertheless the the wound on his plantar right great toe really looks quite a bit better. He does not have insurance nevertheless I think an MRI is going to be indicated and we went ahead and ordered this Electronic Signature(s) Signed: 05/30/2018 5:55:34 PM By: Linton Ham MD Entered By: Linton Ham on 05/30/2018 14:37:04 [name] [name], [name] [name] ([id number]) -------------------------------------------------------------------------------- Physical Exam Details Patient Name: [name] [name] Date of Service: 05/30/2018 2:15 PM Medical Record Number: [id number] Patient Account Number: [id number] Date of Birth/Sex: [birth year] (57 y.o. M) Treating RN: Cornell Barman Primary Care Provider: Soyla Dryer Other Clinician: Referring Provider: Soyla Dryer Treating Provider/Extender: Tito Dine in Treatment: 18 Constitutional Sitting or standing Blood Pressure is within target range for patient.. Pulse regular and within target range for patient.Marland Kitchen Respirations regular, non-labored and within target range.. Temperature is normal and within the target range for the patient.Marland Kitchen appears in no distress. Eyes Conjunctivae clear. No discharge. Cardiovascular Pedal pulses are palpable on the right. Lymphatic Nonpalpable in a right popliteal area. Musculoskeletal I really see no abnormality that is obvious clinically over the right first metatarsal head. Integumentary (Hair, Skin) Skin and subcutaneous tissue without rashes, lesions, discoloration or ulcers. No evidence of fungal disease. Hair and skin color texture normal and healthy in appearance.Marland Kitchen Psychiatric No evidence of  depression, anxiety, or agitation. Calm, cooperative, and communicative. Appropriate  interactions and affect.. Notes Wound exam; the area on the plantar right great toe looks a lot better. There is less erythema and less swelling. No debridement is required. The surface look viable this is quite an improvement from last week. There is no swelling of the MTP joint and no tenderness although the patient is insensate there is certainly no erythema no evidence of an effusion here Electronic Signature(s) Signed: 05/30/2018 5:55:34 PM By: Linton Ham MD Entered By: Linton Ham on 05/30/2018 14:41:16 [name] [name], [name] [name] ([id number]) -------------------------------------------------------------------------------- Physician Orders Details Patient Name: [name] [name] Date of Service: 05/30/2018 2:15 PM Medical Record Number: [id number] Patient Account Number: [id number] Date of Birth/Sex: [birth year] (58 y.o. M) Treating RN: Cornell Barman Primary Care Provider: Soyla Dryer Other Clinician: Referring Provider: Soyla Dryer Treating Provider/Extender: Tito Dine in Treatment: 66 Verbal / Phone Orders: No Diagnosis Coding Wound Cleansing Wound #1 Right,Plantar Toe Great o Clean wound with Normal Saline. Anesthetic (add to Medication List) Wound #1 Right,Plantar Toe Great o Topical Lidocaine 4% cream applied to wound bed prior to debridement (In Clinic Only). Primary Wound Dressing Wound #1 Right,Plantar Toe Great o Silver Alginate Secondary Dressing Wound #1 Right,Plantar Toe Great o Kerlix and Coban Dressing Change Frequency o Three times weekly Follow-up Appointments Wound #1 Right,Plantar Toe Great o Return Appointment in 1 week. Off-Loading Wound #1 Right,Plantar Toe Great o Open toe surgical shoe with peg assist. Medications-please add to medication list. Wound #1 Right,Plantar Toe Great o P.O. Antibiotics - Continue antibiotics Patient Medications Allergies: No Known Allergies Notifications Medication Indication Start  End cephalexin woiund infection 05/30/2018 DOSE oral 500 mg capsule - 1capsule oral qid ofr 2 weeks Electronic Signature(s) [name] [name], [name] [name] ([id number]) Signed: 05/30/2018 2:45:02 PM By: Linton Ham MD Entered By: Linton Ham on 05/30/2018 14:45:01 [name] [name], [name] [name] ([id number]) -------------------------------------------------------------------------------- Problem List Details Patient Name: [name] [name] Date of Service: 05/30/2018 2:15 PM Medical Record Number: [id number] Patient Account Number: [id number] Date of Birth/Sex: [birth year] (57 y.o. 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 E11.40 Type 2 diabetes mellitus with diabetic neuropathy, 01/31/2018 No Yes unspecified M86.471 Chronic osteomyelitis with draining sinus, right ankle and foot 05/30/2018 No Yes Inactive Problems Resolved Problems Electronic Signature(s) Signed: 05/30/2018 5:55:34 PM By: Linton Ham MD Entered By: Linton Ham on 05/30/2018 14:33:46 [name] [name], [name] [name] ([id number]) -------------------------------------------------------------------------------- Progress Note Details Patient Name: [name] [name] Date of Service: 05/30/2018 2:15 PM Medical Record Number: [id number] Patient Account Number: [id number] Date of Birth/Sex: Jan 21, 1961 (57 y.o. M) Treating RN: Cornell Barman Primary Care Provider: Soyla Dryer Other Clinician: Referring Provider: Soyla Dryer Treating Provider/Extender: Tito Dine in Treatment: 18 Subjective History of Present Illness (HPI) 01/24/18-He is seen in  initial evaluation for right great toe ulcer. He was sent to the emergency room on 718 from the nurse clinic office for new-onset diabetes with an A1c of 12.9. He was discharged from the emergency room on metformin, had a right foot x-ray that was negative for osteomyelitis and had a follow-up with his primary care at the free clinic on 7/22. He has been treated twice with antibiotic therapy; bactrim (7/8) and doxycycline (7/18). He  is neuropathic. He tolerated debridement; bone is palpable through a thin layer of tissue; there is, what appears to be, resolving erythema and edema; a culture was taken, antibiotics will be prescribed as needed based on sensitivities. 01/31/18; this is a patient with new onset type 2 diabetes who is on oral agents. Culture from last time showed a few Klebsiella and a few enterococcus is being appropriately treated with a combination of Augmentin and ciprofloxacin for 10 days which I think he just started yesterday. He has to get his medications through the free clinic in [place]. He has no insurance. Been using Prisma to the wound. Previous x-ray last month was negative for osteomyelitis.his ABIs look reasonable. He has an offloading sandal 02/07/18; he completes his antibiotics which are Augmentin and ciprofloxacin on Saturday. Using silver collagen. Once again he has callus around the wound and not a very viable looking wound surface. We have been giving him an offloading sandal but I think he is going to require more offloading than that. Previous x-ray was negative. ABIs in our clinic quite reasonable 02/14/18; he arrives today with the wound dimensions slightly smaller. Still callus nonviable subcutaneous tissue around the wound circumference. He is still in a offloading shoe rather than the Darco. I transitioned him into the Darco today 02/21/18; slight improvement in dimensions. Still aggressive debridements using Prisma. He is using a Darco forefoot offloading  boot 02/28/18; not much improvement. Still requiring aggressive debridement using Prisma. I changed him to Iodoflex to help with the wound debridement. He is using a Darco forefoot offloading boot but tells me he had 1 fall this week. There is not an option for more aggressive offloading. x-ray I ordered last week was negative for osteomyelitis 03/07/18; not much improvement. He arrives with thick skin subcutaneous tissue overhanging the wound covered and callus. This requires debridement. Post debridement the wound surface looks pretty good. As usual have thoughts about of total contact cast but after talking to the patient and his mother and observing the patient leave the clinic there is absolutely no way he could tolerate a total contact cast. He is barely able to maintain balance and the Darco forefoot boot. I suspect this is secondary to diabetic neuropathy 03/14/18; better looking wound today. Plantar aspect of the right great toe. Still requires debridement but less adherent callus nonviable tissue and less debris over the wound surface. He is tolerating the Darco forefoot boot marginally, not a candidate for a TCC. 03/21/18; wound looks a lot better. Still some depth although the base of the wound looks healthy. He still has a fair amount or raised callus which requires debridement. He is tolerating the Darco forefoot boot. He is not a candidate for a TCC secondary to gait imbalance issues 04/04/18; Patient's wound looked a lot better 2 weeks ago. This week necrotic debris over the surface with considerable undermining. He's been using silver collagen. Our intake nurse picked up that he may not been using the Darco forefoot off loader secondary to balance issues. We put him back in a surgical shoe today 04/11/18; some improvement in this wound. He still comes in with callus thick skin and subcutaneous tissue over the wound circumference however debriding this today shows it looks better. We  put him back in a surgical shoe last week because he had had some falls with a Darco and we were convinced he was using the Darco in any case. We've been using Endoform that we started last week 04/25/18. Wound is  generally improved. We are using Endoform.He is offloading this better using a surgical shoe. He cannot tolerate any more aggressive offloading secondary to balance issues 05/02/18; I changed him to Endoform 2 weeks ago and last week this looked better. However he arrives in clinic today with absolutely no improvement. Still requiring debridement. Interestingly it looks as though the great toe had punched a hole in the surface of his healing sandal [name] [name], [name] [name] ([id number]) 05/23/2018; the patient has not been here in 3 weeks. Once again he arrives with a punched hole through his offloading healing sandal. I have not really seen this let alone twice in 2 visits. He also has a considerable amount of erythema in the plantar right great toe as well as dorsally. There is swelling and I suspect active infection. Previously he grew enterococcus and Klebsiella out of this wound some months ago. The Klebsiella was Augmentin resistant. He has not been systemically unwell. He claims to be keeping off this but I am doubtful this is the case 05/30/2018; he arrives today with his toes looking better. He is completed the Augmentin and doxycycline I gave him empirically culture of the wound grew Haemophilus parainfluenza beta-lactamase negative and methicillin sensitive staph aureus. The x-ray suggested a progressive lucency involving the head of the first metatarsal bone which is NOT the exact area where the wound is located. Nevertheless the the wound on his plantar right great toe really looks quite a bit better. He does not have insurance nevertheless I think an MRI is going to be indicated and we went ahead and ordered this Objective Constitutional Sitting or standing Blood Pressure is within  target range for patient.. Pulse regular and within target range for patient.Marland Kitchen Respirations regular, non-labored and within target range.. Temperature is normal and within the target range for the patient.Marland Kitchen appears in no distress. Vitals Time Taken: 2:05 PM, Height: 70 in, Weight: 139 lbs, BMI: 19.9, Temperature: 97.9 F, Pulse: 96 bpm, Respiratory Rate: 16 breaths/min, Blood Pressure: 138/82 mmHg. Eyes Conjunctivae clear. No discharge. Cardiovascular Pedal pulses are palpable on the right. Lymphatic Nonpalpable in a right popliteal area. Musculoskeletal I really see no abnormality that is obvious clinically over the right first metatarsal head. Psychiatric No evidence of depression, anxiety, or agitation. Calm, cooperative, and communicative. Appropriate interactions and affect.. General Notes: Wound exam; the area on the plantar right great toe looks a lot better. There is less erythema and less swelling. No debridement is required. The surface look viable this is quite an improvement from last week. There is no swelling of the MTP joint and no tenderness although the patient is insensate there is certainly no erythema no evidence of an effusion here Integumentary (Hair, Skin) Skin and subcutaneous tissue without rashes, lesions, discoloration or ulcers. No evidence of fungal disease. Hair and skin color texture normal and healthy in appearance.. Wound #1 status is Open. Original cause of wound was Gradually Appeared. The wound is located on the [employer]. The wound measures 0.5cm length x 0.3cm width x 0.3cm depth; 0.118cm^2 area and 0.035cm^3 volume. There is Fat Layer (Subcutaneous Tissue) Exposed exposed. There is no tunneling or undermining noted. There is a small amount of Sp[place], Jhamari ([id number]) serous drainage noted. Foul odor after cleansing was noted. The wound margin is distinct with the outline attached to the wound base. There is no granulation within the  wound bed. There is a small (1-33%) amount of necrotic tissue within the wound bed including Adherent Slough. The periwound skin  appearance exhibited: Callus. The periwound skin appearance did not exhibit: Crepitus, Excoriation, Induration, Rash, Scarring, Dry/Scaly, Maceration, Atrophie Blanche, Cyanosis, Ecchymosis, Hemosiderin Staining, Mottled, Pallor, Rubor, Erythema. Periwound temperature was noted as No Abnormality. Assessment Active Problems ICD-10 Type 2 diabetes mellitus with foot ulcer Non-pressure chronic ulcer of other part of right foot with necrosis of muscle Type 2 diabetes mellitus with diabetic neuropathy, unspecified Chronic osteomyelitis with draining sinus, right ankle and foot Plan Wound Cleansing: Wound #1 Right,Plantar Toe Great: Clean wound with Normal Saline. Anesthetic (add to Medication List): Wound #1 Right,Plantar Toe Great: Topical Lidocaine 4% cream applied to wound bed prior to debridement (In Clinic Only). Primary Wound Dressing: Wound #1 Right,Plantar Toe Great: Silver Alginate Secondary Dressing: Wound #1 Right,Plantar Toe Great: Kerlix and Coban Dressing Change Frequency: Three times weekly Follow-up Appointments: Wound #1 Right,Plantar Toe Great: Return Appointment in 1 week. Off-Loading: Wound #1 Right,Plantar Toe Great: Open toe surgical shoe with peg assist. Medications-please add to medication list.: Wound #1 Right,Plantar Toe Great: P.O. Antibiotics - Continue antibiotics The following medication(s) was prescribed: cephalexin oral 500 mg capsule 1capsule oral qid ofr 2 weeks for woiund infection starting 05/30/2018 1. I am continue with silver alginate to the wound 2. The area that the plain x-ray questions is on the head of the first metatarsal bone. I have reviewed the x-ray and this is [name] [name], [name] [name] ([id number]) been progressive since the last exam on 12/28/2017. This is not exactly the area where the wound is but nevertheless  the possibility of osteomyelitis needs to be explored with an MRI with contrast. 3. The patient cultured Haemophilus parainfluenza which was beta-lactamase negative and methicillin sensitive staph aureus last week at which time there was probing to bone. I am going to give him empiric cephalexin for the next 2 weeks. As mentioned the wound looks a lot better today Electronic Signature(s) Signed: 05/30/2018 2:45:26 PM By: Linton Ham MD Entered By: Linton Ham on 05/30/2018 14:45:26 [name] [name], [name] [name] ([id number]) -------------------------------------------------------------------------------- SuperBill Details Patient Name: [name] [name] Date of Service: 05/30/2018 Medical Record Number: [id number] Patient Account Number: [id number] Date of Birth/Sex: February 04, 1961 (57 y.o. M) Treating RN: Cornell Barman Primary Care Provider: Soyla Dryer Other Clinician: Referring Provider: Soyla Dryer Treating Provider/Extender: Tito Dine in Treatment: 18 Diagnosis Coding ICD-10 Codes Code Description E11.621 Type 2 diabetes mellitus with foot ulcer L97.513 Non-pressure chronic ulcer of other part of right foot with necrosis of muscle E11.40 Type 2 diabetes mellitus with diabetic neuropathy, unspecified M86.471 Chronic osteomyelitis with draining sinus, right ankle and foot Facility Procedures CPT4 Code: [id number] Description: 92010 - WOUND CARE VISIT-LEV 2 EST PT Modifier: Quantity: 1 Physician Procedures CPT4 Code Description: [id number] [place] - WC PHYS LEVEL 3 - EST PT ICD-10 Diagnosis Description L97.513 Non-pressure chronic ulcer of other part of right foot with necr E11.621 Type 2 diabetes mellitus with foot ulcer Modifier: osis of muscle Quantity: 1 Electronic Signature(s) Signed: 05/30/2018 5:55:34 PM By: Linton Ham MD Entered By: Linton Ham on 05/30/2018 15:44:05

## 2018-06-20 ENCOUNTER — Encounter: Payer: Self-pay | Attending: Internal Medicine | Admitting: Internal Medicine

## 2018-06-20 ENCOUNTER — Ambulatory Visit
Admission: RE | Admit: 2018-06-20 | Discharge: 2018-06-20 | Disposition: A | Discharge status: Home / Self Care | Payer: Self-pay | Source: Ambulatory Visit | Attending: Internal Medicine | Admitting: Internal Medicine

## 2018-06-20 DIAGNOSIS — L97513 Non-pressure chronic ulcer of other part of right foot with necrosis of muscle: Secondary | ICD-10-CM | POA: Insufficient documentation

## 2018-06-20 DIAGNOSIS — L03031 Cellulitis of right toe: Secondary | ICD-10-CM | POA: Insufficient documentation

## 2018-06-20 DIAGNOSIS — E11621 Type 2 diabetes mellitus with foot ulcer: Secondary | ICD-10-CM | POA: Insufficient documentation

## 2018-06-20 DIAGNOSIS — L97509 Non-pressure chronic ulcer of other part of unspecified foot with unspecified severity: Secondary | ICD-10-CM | POA: Insufficient documentation

## 2018-06-20 DIAGNOSIS — L97512 Non-pressure chronic ulcer of other part of right foot with fat layer exposed: Secondary | ICD-10-CM | POA: Insufficient documentation

## 2018-06-20 DIAGNOSIS — E1140 Type 2 diabetes mellitus with diabetic neuropathy, unspecified: Secondary | ICD-10-CM | POA: Insufficient documentation

## 2018-06-20 DIAGNOSIS — E13621 Other specified diabetes mellitus with foot ulcer: Secondary | ICD-10-CM | POA: Insufficient documentation

## 2018-06-20 DIAGNOSIS — Z7984 Long term (current) use of oral hypoglycemic drugs: Secondary | ICD-10-CM | POA: Insufficient documentation

## 2018-06-20 LAB — POCT I-STAT CREATININE: Creatinine, Ser: 0.7 mg/dL (ref 0.61–1.24)

## 2018-06-20 MED ORDER — GADOBUTROL 1 MMOL/ML IV SOLN
7.5000 mL | Freq: Once | INTRAVENOUS | Status: AC | PRN
  Administered 2018-06-20: 6 mL via INTRAVENOUS

## 2018-06-21 ENCOUNTER — Ambulatory Visit: Payer: Self-pay

## 2018-06-21 NOTE — Progress Notes (Signed)
[name], [name] ([id number]) Visit Report for 06/20/2018 Debridement Details Patient Name: [name] [name], [name] [name] Date of Service: 06/20/2018 10:30 AM Medical Record Number: [id number] Patient Account Number: [id number] Date of Birth/Sex: 03-09-1961 (58 y.o. M) Treating RN: [name] [name] Primary Care Provider: Soyla [name] Other Clinician: Referring Provider: Soyla [name] Treating Provider/Extender: [name] [name] in Treatment: 21 Debridement Performed for Wound #1 Right,Plantar Toe Great Assessment: Performed By: Physician [name] Dillon, MD Debridement Type: Debridement Severity of Tissue Pre Fat layer exposed Debridement: Level of Consciousness (Pre- Awake and Alert procedure): Pre-procedure Verification/Time Yes - 11:10 Out Taken: Start Time: 11:10 Pain Control: Lidocaine 4% Topical Solution Total Area Debrided (L x W): 0.3 (cm) x 0.2 (cm) = 0.06 (cm) Tissue and other material Callus, Slough, Subcutaneous, Slough debrided: Level: Skin/Subcutaneous Tissue Debridement Description: Excisional Instrument: Curette Bleeding: Moderate Hemostasis Achieved: Silver Nitrate End Time: 11:13 Procedural Pain: 0 Post Procedural Pain: 0 Response to Treatment: Procedure was tolerated well Level of Consciousness Awake and Alert (Post-procedure): Post Debridement Measurements of Total Wound Length: (cm) 0.7 Width: (cm) 0.8 Depth: (cm) 0.4 Volume: (cm) 0.176 Character of Wound/Ulcer Post Debridement: Improved Severity of Tissue Post Debridement: Fat layer exposed Post Procedure Diagnosis Same as Pre-procedure Electronic Signature(s) Signed: 06/20/2018 5:09:58 PM By: Linton Ham MD Signed: 06/20/2018 5:48:26 PM By: [name] [name] Entered By: Linton Ham on 06/20/2018 12:13:43 [place], [name] [name] ([id number]) [name] [name], [name] [name] ([id number]) -------------------------------------------------------------------------------- HPI Details Patient Name: [name] [name] Date of  Service: 06/20/2018 10:30 AM Medical Record Number: [id number] Patient Account Number: [id number] Date of Birth/Sex: Mar 04, 1961 (58 y.o. M) Treating RN: [name] [name] Primary Care Provider: Soyla [name] Other Clinician: Referring Provider: Soyla [name] Treating Provider/Extender: [name] [name] in Treatment: 21 History of Present Illness HPI Description: 01/24/18-He is seen in initial evaluation for right great toe ulcer. He was sent to the emergency room on 718 from the nurse clinic office for new-onset diabetes with an A1c of 12.9. He was discharged from the emergency room on metformin, had a right foot x-ray that was negative for osteomyelitis and had a follow-up with his primary care at the free clinic on 7/22. He has been treated twice with antibiotic therapy; bactrim (7/8) and doxycycline (7/18). He is neuropathic. He tolerated debridement; bone is palpable through a thin layer of tissue; there is, what appears to be, resolving erythema and edema; a culture was taken, antibiotics will be prescribed as needed based on sensitivities. 01/31/18; this is a patient with new onset type 2 diabetes who is on oral agents. Culture from last time showed a few Klebsiella and a few enterococcus is being appropriately treated with a combination of Augmentin and ciprofloxacin for 10 days which I think he just started yesterday. He has to get his medications through the free clinic in [place]. He has no insurance. Been using Prisma to the wound. Previous x-ray last month was negative for osteomyelitis.his ABIs look reasonable. He has an offloading sandal 02/07/18; he completes his antibiotics which are Augmentin and ciprofloxacin on Saturday. Using silver collagen. Once again he has callus around the wound and not a very viable looking wound surface. We have been giving him an offloading sandal but I think he is going to require more offloading than that. Previous x-ray was negative. ABIs in  our clinic quite reasonable 02/14/18; he arrives today with the wound dimensions slightly smaller. Still callus nonviable subcutaneous tissue around the wound circumference. He is still in a offloading shoe rather than the Darco. I transitioned him into the Darco today  02/21/18; slight improvement in dimensions. Still aggressive debridements using Prisma. He is using a Darco forefoot offloading boot 02/28/18; not much improvement. Still requiring aggressive debridement using Prisma. I changed him to Iodoflex to help with the wound debridement. He is using a Darco forefoot offloading boot but tells me he had 1 fall this week. There is not an option for more aggressive offloading. x-ray I ordered last week was negative for osteomyelitis 03/07/18; not much improvement. He arrives with thick skin subcutaneous tissue overhanging the wound covered and callus. This requires debridement. Post debridement the wound surface looks pretty good. As usual have thoughts about of total contact cast but after talking to the patient and his mother and observing the patient leave the clinic there is absolutely no way he could tolerate a total contact cast. He is barely able to maintain balance and the Darco forefoot boot. I suspect this is secondary to diabetic neuropathy 03/14/18; better looking wound today. Plantar aspect of the right great toe. Still requires debridement but less adherent callus nonviable tissue and less debris over the wound surface. He is tolerating the Darco forefoot boot marginally, not a candidate for a TCC. 03/21/18; wound looks a lot better. Still some depth although the base of the wound looks healthy. He still has a fair amount or raised callus which requires debridement. He is tolerating the Darco forefoot boot. He is not a candidate for a TCC secondary to gait imbalance issues 04/04/18; Patient's wound looked a lot better 2 weeks ago. This week necrotic debris over the surface with  considerable undermining. He's been using silver collagen. Our intake nurse picked up that he may not been using the Darco forefoot off loader secondary to balance issues. We put him back in a surgical shoe today 04/11/18; some improvement in this wound. He still comes in with callus thick skin and subcutaneous tissue over the wound circumference however debriding this today shows it looks better. We put him back in a surgical shoe last week because he had had some falls with a Darco and we were convinced he was using the Darco in any case. We've been using Endoform that we started last week 04/25/18. Wound is generally improved. We are using Endoform.He is offloading this better using a surgical shoe. He cannot tolerate any more aggressive offloading secondary to balance issues 05/02/18; I changed him to Endoform 2 weeks ago and last week this looked better. However he arrives in clinic today with absolutely no improvement. Still requiring debridement. Interestingly it looks as though the great toe had punched a hole in the surface of his healing sandal 05/23/2018; the patient has not been here in 3 weeks. Once again he arrives with a punched hole through his offloading Nebergall, Doran ([id number]) healing sandal. I have not really seen this let alone twice in 2 visits. He also has a considerable amount of erythema in the plantar right great toe as well as dorsally. There is swelling and I suspect active infection. Previously he grew enterococcus and Klebsiella out of this wound some months ago. The Klebsiella was Augmentin resistant. He has not been systemically unwell. He claims to be keeping off this but I am doubtful this is the case 05/30/2018; he arrives today with his toe looking better. He is completed the Augmentin and doxycycline I gave him empirically culture of the wound grew Haemophilus parainfluenza beta-lactamase negative and methicillin sensitive staph aureus. The x-ray suggested  a progressive lucency involving the head of the first metatarsal  bone which is NOT the exact area where the wound is located. Nevertheless the the wound on his plantar right great toe really looks quite a bit better. He does not have insurance nevertheless I think an MRI is going to be indicated and we went ahead and ordered this 06/20/2018; arrived with his wound almost totally covered except for a small area with thick callus. However this was obviously not closed. He apparently has been not dressing this for the last week because he ran out of product. MRI was done today. This showed cellulitis of the great toe with findings consistent with osteomyelitis throughout the distal phalanx. Negative for abscess or septic joint. Advanced first MTP osteoarthritis. Electronic Signature(s) Signed: 06/20/2018 5:09:58 PM By: Linton Ham MD Entered By: Linton Ham on 06/20/2018 12:18:12 [name] [name], [name] [name] ([id number]) -------------------------------------------------------------------------------- Physical Exam Details Patient Name: [name] [name] Date of Service: 06/20/2018 10:30 AM Medical Record Number: [id number] Patient Account Number: [id number] Date of Birth/Sex: [birth year] (57 y.o. M) Treating RN: [name] [name] Primary Care Provider: Soyla [name] Other Clinician: Referring Provider: Soyla [name] Treating Provider/Extender: [name] [name] in Treatment: 21 Constitutional Patient is hypotensive.. Pulse regular and within target range for patient.Marland Kitchen Respirations regular, non-labored and within target range.. Temperature is normal and within the target range for the patient.Marland Kitchen appears in no distress. Notes Wound exam; the area on the plantar right great toe was only a small open area however clearly the surface of this is not viable. Mostly callus and thick skin. Using a #5 curette I remove this to fully expose the wound there is necrotic material in the center of it. This does not go  to bone but it does have significant relative depth. The patient is insensate from diabetic neuropathy but there is no erythema or tenderness. No evidence of involvement of the inner phalangeal joint Electronic Signature(s) Signed: 06/20/2018 5:09:58 PM By: Linton Ham MD Entered By: Linton Ham on 06/20/2018 12:19:33 [name] [name], [name] [name] ([id number]) -------------------------------------------------------------------------------- Physician Orders Details Patient Name: [name] [name] Date of Service: 06/20/2018 10:30 AM Medical Record Number: [id number] Patient Account Number: [id number] Date of Birth/Sex: 1961-01-08 (57 y.o. M) Treating RN: [name] [name] Primary Care Provider: Soyla [name] Other Clinician: Referring Provider: Soyla [name] Treating Provider/Extender: [name] [name] in Treatment: 21 Verbal / Phone Orders: No Diagnosis Coding Wound Cleansing Wound #1 Right,Plantar Toe Great o Clean wound with Normal Saline. Anesthetic (add to Medication List) Wound #1 Right,Plantar Toe Great o Topical Lidocaine 4% cream applied to wound bed prior to debridement (In Clinic Only). Primary Wound Dressing Wound #1 Right,Plantar Toe Great o Silver Alginate Secondary Dressing Wound #1 Right,Plantar Toe Great o Kerlix and Coban Dressing Change Frequency o Three times weekly Follow-up Appointments Wound #1 Right,Plantar Toe Great o Return Appointment in 1 week. Off-Loading Wound #1 Right,Plantar Toe Great o Open toe surgical shoe with peg assist. Medications-please add to medication list. Wound #1 Right,Plantar Toe Great o P.O. Antibiotics - Continue antibiotics Consults o Infectious Disease Patient Medications Allergies: No Known Allergies Notifications Medication Indication Start End Levaquin osteomyelitis 06/20/2018 DOSE oral 500 mg tablet - 1 tablet oral qd for 2weeks [name] [name], [name] [name] ([id number]) Electronic Signature(s) Signed: 06/20/2018 12:30:02  PM By: Linton Ham MD Entered By: Linton Ham on 06/20/2018 12:30:01 [name] [name], [name] [name] ([id number]) -------------------------------------------------------------------------------- Problem List Details Patient Name: [name] [name] Date of Service: 06/20/2018 10:30 AM Medical Record Number: [id number] Patient Account Number: [id number] Date of Birth/Sex: 04-15-1961 (58 y.o. 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 E11.40 Type 2 diabetes mellitus with diabetic neuropathy, 01/31/2018 No Yes unspecified M86.471 Chronic osteomyelitis with draining sinus, right ankle and foot 05/30/2018 No Yes Inactive Problems Resolved Problems Electronic Signature(s) Signed: 06/20/2018 5:09:58 PM By: Linton Ham MD Entered By: Linton Ham on 06/20/2018 12:12:58 [name] [name], [name] [name] ([id number]) -------------------------------------------------------------------------------- Progress Note Details Patient Name: [name] [name] Date of Service: 06/20/2018 10:30 AM Medical Record Number: [id number] Patient Account Number: [id number] Date of Birth/Sex: 1960/08/16 (57 y.o. M) Treating RN: [name] [name] Primary Care Provider: Soyla [name] Other Clinician: Referring Provider: Soyla [name] Treating Provider/Extender: [name] [name] in Treatment: 21 Subjective History of Present Illness (HPI) 01/24/18-He is seen in initial evaluation for right great toe ulcer. He was sent to the emergency room on 718 from the nurse clinic office for new-onset diabetes with an A1c of 12.9. He was discharged  from the emergency room on metformin, had a right foot x-ray that was negative for osteomyelitis and had a follow-up with his primary care at the free clinic on 7/22. He has been treated twice with antibiotic therapy; bactrim (7/8) and doxycycline (7/18). He is neuropathic. He tolerated debridement; bone is palpable through a thin layer of tissue; there is, what appears to be, resolving erythema and edema; a culture was taken, antibiotics will be prescribed as needed based on sensitivities. 01/31/18; this is a patient with new onset type 2 diabetes who is on oral agents. Culture from last time showed a few Klebsiella and a few enterococcus is being appropriately treated with a combination of Augmentin and ciprofloxacin for 10 days which I think he just started yesterday. He has to get his medications through the free clinic in [place]. He has no insurance. Been using Prisma to the wound. Previous x-ray last month was negative for osteomyelitis.his ABIs look reasonable. He has an offloading sandal 02/07/18; he completes his antibiotics which are Augmentin and ciprofloxacin on Saturday. Using silver collagen. Once again he has callus around the wound and not a very viable looking wound surface. We have been giving him an offloading sandal but I think he is going to require more offloading than that. Previous x-ray was negative. ABIs in our clinic quite reasonable 02/14/18; he arrives today with the wound dimensions slightly smaller. Still callus nonviable subcutaneous tissue around the wound circumference. He is still in a offloading shoe rather than the Darco. I transitioned him into the Darco today 02/21/18; slight improvement in dimensions. Still aggressive debridements using Prisma. He is using a Darco forefoot offloading boot 02/28/18; not much improvement. Still requiring aggressive debridement using Prisma. I changed him to Iodoflex to help with the wound debridement. He is using a Darco forefoot  offloading boot but tells me he had 1 fall this week. There is not an option for more aggressive offloading. x-ray I ordered last week was negative for osteomyelitis 03/07/18; not much improvement. He arrives with thick skin subcutaneous tissue overhanging the wound covered and callus. This requires debridement. Post debridement the wound surface looks pretty good. As usual have thoughts about of total contact cast but after talking to the patient and his mother and observing the patient leave the clinic there is absolutely no way he could tolerate a total  contact cast. He is barely able to maintain balance and the Darco forefoot boot. I suspect this is secondary to diabetic neuropathy 03/14/18; better looking wound today. Plantar aspect of the right great toe. Still requires debridement but less adherent callus nonviable tissue and less debris over the wound surface. He is tolerating the Darco forefoot boot marginally, not a candidate for a TCC. 03/21/18; wound looks a lot better. Still some depth although the base of the wound looks healthy. He still has a fair amount or raised callus which requires debridement. He is tolerating the Darco forefoot boot. He is not a candidate for a TCC secondary to gait imbalance issues 04/04/18; Patient's wound looked a lot better 2 weeks ago. This week necrotic debris over the surface with considerable undermining. He's been using silver collagen. Our intake nurse picked up that he may not been using the Darco forefoot off loader secondary to balance issues. We put him back in a surgical shoe today 04/11/18; some improvement in this wound. He still comes in with callus thick skin and subcutaneous tissue over the wound circumference however debriding this today shows it looks better. We put him back in a surgical shoe last week because he had had some falls with a Darco and we were convinced he was using the Darco in any case. We've been using Endoform that we  started last week 04/25/18. Wound is generally improved. We are using Endoform.He is offloading this better using a surgical shoe. He cannot tolerate any more aggressive offloading secondary to balance issues 05/02/18; I changed him to Endoform 2 weeks ago and last week this looked better. However he arrives in clinic today with absolutely no improvement. Still requiring debridement. Interestingly it looks as though the great toe had punched a hole in the surface of his healing sandal [name] [name], [name] [name] ([id number]) 05/23/2018; the patient has not been here in 3 weeks. Once again he arrives with a punched hole through his offloading healing sandal. I have not really seen this let alone twice in 2 visits. He also has a considerable amount of erythema in the plantar right great toe as well as dorsally. There is swelling and I suspect active infection. Previously he grew enterococcus and Klebsiella out of this wound some months ago. The Klebsiella was Augmentin resistant. He has not been systemically unwell. He claims to be keeping off this but I am doubtful this is the case 05/30/2018; he arrives today with his toe looking better. He is completed the Augmentin and doxycycline I gave him empirically culture of the wound grew Haemophilus parainfluenza beta-lactamase negative and methicillin sensitive staph aureus. The x-ray suggested a progressive lucency involving the head of the first metatarsal bone which is NOT the exact area where the wound is located. Nevertheless the the wound on his plantar right great toe really looks quite a bit better. He does not have insurance nevertheless I think an MRI is going to be indicated and we went ahead and ordered this 06/20/2018; arrived with his wound almost totally covered except for a small area with thick callus. However this was obviously not closed. He apparently has been not dressing this for the last week because he ran out of product. MRI was done  today. This showed cellulitis of the great toe with findings consistent with osteomyelitis throughout the distal phalanx. Negative for abscess or septic joint. Advanced first MTP osteoarthritis. Objective Constitutional Patient is hypotensive.. Pulse regular and within target range for patient.Marland Kitchen Respirations regular, non-labored and within  target range.. Temperature is normal and within the target range for the patient.Marland Kitchen appears in no distress. Vitals Time Taken: 10:29 AM, Height: 70 in, Weight: 139 lbs, BMI: 19.9, Temperature: 97.6 F, Pulse: 93 bpm, Respiratory Rate: 16 breaths/min, Blood Pressure: 146/70 mmHg. General Notes: Wound exam; the area on the plantar right great toe was only a small open area however clearly the surface of this is not viable. Mostly callus and thick skin. Using a #5 curette I remove this to fully expose the wound there is necrotic material in the center of it. This does not go to bone but it does have significant relative depth. The patient is insensate from diabetic neuropathy but there is no erythema or tenderness. No evidence of involvement of the inner phalangeal joint Integumentary (Hair, Skin) Wound #1 status is Open. Original cause of wound was Gradually Appeared. The wound is located on the [employer]. The wound measures 0.3cm length x 0.2cm width x 0.5cm depth; 0.047cm^2 area and 0.024cm^3 volume. There is Fat Layer (Subcutaneous Tissue) Exposed exposed. There is undermining starting at 12:00 and ending at 12:00 with a maximum distance of 0.8cm. There is a small amount of serous drainage noted. Foul odor after cleansing was noted. The wound margin is distinct with the outline attached to the wound base. There is no granulation within the wound bed. There is a small (1-33%) amount of necrotic tissue within the wound bed including Adherent Slough. The periwound skin appearance exhibited: Callus. The periwound skin appearance did not exhibit:  Crepitus, Excoriation, Induration, Rash, Scarring, Dry/Scaly, Maceration, Atrophie [name] [name], Cyanosis, Ecchymosis, Hemosiderin Staining, Mottled, Pallor, Rubor, Erythema. Periwound temperature was noted as No Abnormality. Assessment Active Problems [name] [name], [name] [name] ([id number]) ICD-10 Type 2 diabetes mellitus with foot ulcer Non-pressure chronic ulcer of other part of right foot with necrosis of muscle Type 2 diabetes mellitus with diabetic neuropathy, unspecified Chronic osteomyelitis with draining sinus, right ankle and foot Procedures Wound #1 Pre-procedure diagnosis of Wound #1 is a Diabetic Wound/Ulcer of the Lower Extremity located on the Right,Plantar Toe Great .Severity of Tissue Pre Debridement is: Fat layer exposed. There was a Excisional Skin/Subcutaneous Tissue Debridement with a total area of 0.06 sq cm performed by [name] Dillon, MD. With the following instrument(s): Curette Material removed includes Callus, Subcutaneous Tissue, and Slough after achieving pain control using Lidocaine 4% Topical Solution. No specimens were taken. A time out was conducted at 11:10, prior to the start of the procedure. A Moderate amount of bleeding was controlled with Silver Nitrate. The procedure was tolerated well with a pain level of 0 throughout and a pain level of 0 following the procedure. Post Debridement Measurements: 0.7cm length x 0.8cm width x 0.4cm depth; 0.176cm^3 volume. Character of Wound/Ulcer Post Debridement is improved. Severity of Tissue Post Debridement is: Fat layer exposed. Post procedure Diagnosis Wound #1: Same as Pre-Procedure Plan Wound Cleansing: Wound #1 Right,Plantar Toe Great: Clean wound with Normal Saline. Anesthetic (add to Medication List): Wound #1 Right,Plantar Toe Great: Topical Lidocaine 4% cream applied to wound bed prior to debridement (In Clinic Only). Primary Wound Dressing: Wound #1 Right,Plantar Toe Great: Silver Alginate Secondary  Dressing: Wound #1 Right,Plantar Toe Great: Kerlix and Coban Dressing Change Frequency: Three times weekly Follow-up Appointments: Wound #1 Right,Plantar Toe Great: Return Appointment in 1 week. Off-Loading: Wound #1 Right,Plantar Toe Great: Open toe surgical shoe with peg assist. Medications-please add to medication list.: Wound #1 Right,Plantar Toe Great: P.O. Antibiotics - Continue antibiotics Consults ordered were: Infectious Disease  [place], [place] ([id number]) The following medication(s) was prescribed: Levaquin oral 500 mg tablet 1 tablet oral qd for 2weeks for osteomyelitis starting 06/20/2018 1. Silver alginate Kerlix and Coban 2. The patient is indigent. I will see what I can do to get him adequate antibiotics. I will start probably with Levaquin. Last culture I did on 12/11 showed methicillin sensitive and quinolone sensitive staph aureus. I will probably add a 2-week course of Flagyl to cover for the possibility of anaerobes at some point. I am reluctant to use trimethoprim sulfamethoxazole because of hyperkalemia in conjunction with an ACE inhibitor that he is on. 3. I am going to see about getting him into see of infectious disease. With his financial status I am not sure if there is an avenue to get him IV antibiotics. I would also like a comment on the adequacy of oral antibiotics in this situation. 4. I did bring up amputation with him today and I have planned to talk about this next week again. I do not think he was excepting of the thought. I do not think there is a vascular issue Electronic Signature(s) Signed: 06/20/2018 5:09:58 PM By: Linton Ham MD Previous Signature: 06/20/2018 12:30:32 PM Version By: Linton Ham MD Entered By: Linton Ham on 06/20/2018 12:31:18 [name] [name] [place], [place] ([id number]) -------------------------------------------------------------------------------- [place] Details Patient Name: [name] [name] Date of Service: 06/20/2018 Medical  Record Number: [id number] Patient Account Number: [id number] Date of Birth/Sex: 1960/09/16 (57 y.o. 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

## 2018-06-22 NOTE — Progress Notes (Signed)
[facility], [place] ([id number]) Visit Report for 06/20/2018 Arrival Information Details Patient Name: [name] [name], [name] [name] Date of Service: 06/20/2018 10:30 AM Medical Record Number: [id number] Patient Account Number: [id number] Date of Birth/Sex: 03-16-1961 (57 y.o. M) Treating RN: Montey Hora Primary Care Kairi Harshbarger: Soyla Dryer Other Clinician: Referring Thaer Miyoshi: Soyla Dryer Treating Zaynah Chawla/Extender: Tito Dine in Treatment: 21 Visit Information History Since Last Visit Added or deleted any medications: No Patient Arrived: Ambulatory Any new allergies or adverse reactions: No Arrival Time: 10:28 Had a fall or experienced change in No Accompanied By: mother activities of daily living that may affect Transfer Assistance: None risk of falls: Patient Identification Verified: Yes Signs or symptoms of abuse/neglect since last visito No Secondary Verification Process Completed: Yes Hospitalized since last visit: No Implantable device outside of the clinic excluding No cellular tissue based products placed in the center since last visit: Has Dressing in Place as Prescribed: Yes Pain Present Now: No Electronic Signature(s) Signed: 06/20/2018 4:30:10 PM By: Lorine Bears RCP, RRT, CHT Entered By: Lorine Bears on 06/20/2018 10:29:43 [name] [name], [name] [name] ([id number]) -------------------------------------------------------------------------------- Encounter Discharge Information Details Patient Name: [name] [name] Date of Service: 06/20/2018 10:30 AM Medical Record Number: [id number] Patient Account Number: [id number] Date of Birth/Sex: August 02, 1960 (57 y.o. M) Treating RN: Montey Hora Primary Care Weiland Tomich: Soyla Dryer Other Clinician: Referring Merly Hinkson: Soyla Dryer Treating Lynsee Wands/Extender: Tito Dine in Treatment: 21 Encounter Discharge Information Items Post Procedure Vitals Discharge Condition: Stable Temperature  (F): 97.6 Ambulatory Status: Ambulatory Pulse (bpm): 93 Discharge Destination: Home Respiratory Rate (breaths/min): 18 Transportation: Private Auto Blood Pressure (mmHg): 146/70 Accompanied By: mother Schedule Follow-up Appointment: Yes Clinical Summary of Care: Electronic Signature(s) Signed: 06/20/2018 5:48:26 PM By: Montey Hora Entered By: Montey Hora on 06/20/2018 11:23:49 [name] [name], [name] [name] ([id number]) -------------------------------------------------------------------------------- Lower Extremity Assessment Details Patient Name: [name] [name] Date of Service: 06/20/2018 10:30 AM Medical Record Number: [id number] Patient Account Number: [id number] Date of Birth/Sex: Oct 18, 1960 (57 y.o. M) Treating RN: Harold Barban Primary Care Denell Cothern: Soyla Dryer Other Clinician: Referring Champ Keetch: Soyla Dryer Treating Stela Iwasaki/Extender: Tito Dine in Treatment: 21 Electronic Signature(s) Signed: 06/21/2018 2:41:13 PM By: Harold Barban Entered By: Harold Barban on 06/20/2018 10:40:47 [name] [name], [name] [name] ([id number]) -------------------------------------------------------------------------------- Multi Wound Chart Details Patient Name: [name] [name] Date of Service: 06/20/2018 10:30 AM Medical Record Number: [id number] Patient Account Number: [id number] Date of Birth/Sex: 04-07-1961 (57 y.o. M) Treating RN: Montey Hora Primary Care Malorie Bigford: Soyla Dryer Other Clinician: Referring Jahshua Bonito: Soyla Dryer Treating Kristy Schomburg/Extender: Tito Dine in Treatment: 21 Vital Signs Height(in): 70 Pulse(bpm): 93 Weight(lbs): 139 Blood Pressure(mmHg): 146/70 Body Mass Index(BMI): 20 Temperature(F): 97.6 Respiratory Rate 16 (breaths/min): Photos: [N/A:N/A] Wound Location: Right Toe Great - Plantar N/A N/A Wounding Event: Gradually Appeared N/A N/A Primary Etiology: Diabetic Wound/Ulcer of the N/A N/A Lower Extremity Comorbid History:  Cataracts, Hypertension, Type N/A N/A II Diabetes, Rheumatoid Arthritis, Neuropathy Date Acquired: 12/24/2017 N/A N/A Weeks of Treatment: 21 N/A N/A Wound Status: Open N/A N/A Measurements L x W x D 0.3x0.2x0.5 N/A N/A (cm) Area (cm) : 0.047 N/A N/A Volume (cm) : 0.024 N/A N/A % Reduction in Area: 91.50% N/A N/A % Reduction in Volume: 92.70% N/A N/A Starting Position 1 12 (o'clock): Ending Position 1 12 (o'clock): Maximum Distance 1 (cm): 0.8 Undermining: Yes N/A N/A Classification: Grade 2 N/A N/A Exudate Amount: Small N/A N/A Exudate Type: Serous N/A N/A Exudate Color: amber N/A N/A Foul Odor After Cleansing: Yes N/A N/A Odor Anticipated Due to No N/A N/A Product Use: Corbitt, Kaylin (  [id number]) Wound Margin: Distinct, outline attached N/A N/A Granulation Amount: None Present (0%) N/A N/A Necrotic Amount: Small (1-33%) N/A N/A Exposed Structures: Fat Layer (Subcutaneous N/A N/A Tissue) Exposed: Yes Fascia: No Tendon: No Muscle: No Joint: No Bone: No Epithelialization: None N/A N/A Debridement: Debridement - Excisional N/A N/A Pre-procedure 11:10 N/A N/A Verification/Time Out Taken: Pain Control: Lidocaine 4% Topical Solution N/A N/A Tissue Debrided: Callus, Subcutaneous, Slough N/A N/A Level: Skin/Subcutaneous Tissue N/A N/A Debridement Area (sq cm): 0.06 N/A N/A Instrument: Curette N/A N/A Bleeding: Moderate N/A N/A Hemostasis Achieved: Silver Nitrate N/A N/A Procedural Pain: 0 N/A N/A Post Procedural Pain: 0 N/A N/A Debridement Treatment Procedure was tolerated well N/A N/A Response: Post Debridement 0.7x0.8x0.4 N/A N/A Measurements L x W x D (cm) Post Debridement Volume: 0.176 N/A N/A (cm) Periwound Skin Texture: Callus: Yes N/A N/A Excoriation: No Induration: No Crepitus: No Rash: No Scarring: No Periwound Skin Moisture: Maceration: No N/A N/A Dry/Scaly: No Periwound Skin Color: Atrophie Blanche: No N/A N/A Cyanosis: No Ecchymosis:  No Erythema: No Hemosiderin Staining: No Mottled: No Pallor: No Rubor: No Temperature: No Abnormality N/A N/A Tenderness on Palpation: No N/A N/A Wound Preparation: Ulcer Cleansing: N/A N/A Rinsed/Irrigated with Saline Topical Anesthetic Applied: Other: lidocaine 4% Procedures Performed: Debridement N/A N/A Treatment Notes Wound #1 (Right, Plantar Toe [name] [name], [name] [name] ([id number]) Notes silvercel, foam, secured with conform and tape, darco front Dietitian) Signed: 06/20/2018 5:09:58 PM By: Linton Ham MD Entered By: Linton Ham on 06/20/2018 12:13:12 [name] [name], [name] [name] ([id number]) -------------------------------------------------------------------------------- Multi-Disciplinary Care Plan Details Patient Name: [name] [name] Date of Service: 06/20/2018 10:30 AM Medical Record Number: [id number] Patient Account Number: [id number] Date of Birth/Sex: 28-Jan-1961 (57 y.o. M) Treating RN: Montey Hora Primary Care Cyncere Ruhe: Soyla Dryer Other Clinician: Referring Katelynn Heidler: Soyla Dryer Treating Marsella Suman/Extender: Tito Dine in Treatment: 21 Active Inactive Necrotic Tissue Nursing Diagnoses: Knowledge deficit related to management of necrotic/devitalized tissue Goals: Necrotic/devitalized tissue will be minimized in the wound bed Date Initiated: 01/24/2018 Target Resolution Date: 02/09/2018 Goal Status: Active Interventions: Assess patient pain level pre-, during and post procedure and prior to discharge Treatment Activities: Excisional debridement : 01/24/2018 Notes: Orientation to the Wound Care Program Nursing Diagnoses: Knowledge deficit related to the wound healing center program Goals: Patient/caregiver will verbalize understanding of the [place] Date Initiated: 01/24/2018 Target Resolution Date: 02/09/2018 Goal Status: Active Interventions: Provide education on orientation to the wound  center Notes: Pressure Nursing Diagnoses: Knowledge deficit related to management of pressures ulcers Goals: Patient will remain free from development of additional pressure ulcers Date Initiated: 01/24/2018 Target Resolution Date: 02/09/2018 Goal Status: Active [place], [place] ([id number]) Interventions: Assess potential for pressure ulcer upon admission and as needed Notes: Wound/Skin Impairment Nursing Diagnoses: Knowledge deficit related to ulceration/compromised skin integrity Goals: Ulcer/skin breakdown will have a volume reduction of 30% by week 4 Date Initiated: 01/24/2018 Target Resolution Date: 02/24/2018 Goal Status: Active Interventions: Assess ulceration(s) every visit Treatment Activities: Topical wound management initiated : 01/24/2018 Notes: Electronic Signature(s) Signed: 06/20/2018 5:48:26 PM By: Montey Hora Entered By: Montey Hora on 06/20/2018 11:10:24 [name] [name], [name] [name] ([id number]) -------------------------------------------------------------------------------- Pain Assessment Details Patient Name: [name] [name] Date of Service: 06/20/2018 10:30 AM Medical Record Number: [id number] Patient Account Number: [id number] Date of Birth/Sex: 06/19/1960 (57 y.o. M) Treating RN: Montey Hora Primary Care Kaedance Magos: Soyla Dryer Other Clinician: Referring Edrei Norgaard: Soyla Dryer Treating Camara Rosander/Extender: Tito Dine in Treatment: 21 Active Problems Location of Pain Severity and Description of Pain Patient Has Paino No  Site Locations Pain Management and Medication Current Pain Management: Electronic Signature(s) Signed: 06/20/2018 4:30:10 PM By: Lorine Bears RCP, RRT, CHT Signed: 06/20/2018 5:48:26 PM By: Montey Hora Entered By: Lorine Bears on 06/20/2018 10:29:50 [name] [name], [name] [name] ([id number]) -------------------------------------------------------------------------------- Patient/Caregiver Education  Details Patient Name: [name] [name] Date of Service: 06/20/2018 10:30 AM Medical Record Number: [id number] Patient Account Number: [id number] Date of Birth/Gender: Jun 12, 1961 (57 y.o. M) Treating RN: Montey Hora Primary Care Physician: Soyla Dryer Other Clinician: Referring Physician: Soyla Dryer Treating Physician/Extender: Tito Dine in Treatment: 21 Education Assessment Education Provided To: Patient and Caregiver Education Topics Provided Wound/Skin Impairment: Handouts: Other: wound care to continue as ordered Methods: Demonstration, Explain/Verbal Responses: State content correctly Electronic Signature(s) Signed: 06/20/2018 5:48:26 PM By: Montey Hora Entered By: Montey Hora on 06/20/2018 11:24:05 [name] [name], [name] [name] ([id number]) -------------------------------------------------------------------------------- Wound Assessment Details Patient Name: [name] [name] Date of Service: 06/20/2018 10:30 AM Medical Record Number: [id number] Patient Account Number: [id number] Date of Birth/Sex: December 29, 1960 (57 y.o. M) Treating RN: Harold Barban Primary Care Elleen Coulibaly: Soyla Dryer Other Clinician: Referring Abaigeal Moomaw: Soyla Dryer Treating Leilah Polimeni/Extender: Tito Dine in Treatment: 21 Wound Status Wound Number: 1 Primary Diabetic Wound/Ulcer of the Lower Extremity Etiology: Wound Location: Right Toe Great - Plantar Wound Open Wounding Event: Gradually Appeared Status: Date Acquired: 12/24/2017 Comorbid Cataracts, Hypertension, Type II Diabetes, Weeks Of Treatment: 21 History: Rheumatoid Arthritis, Neuropathy Clustered Wound: No Photos Photo Uploaded By: Harold Barban on 06/20/2018 10:46:01 Wound Measurements Length: (cm) 0.3 Width: (cm) 0.2 Depth: (cm) 0.5 Area: (cm) 0.047 Volume: (cm) 0.024 % Reduction in Area: 91.5% % Reduction in Volume: 92.7% Epithelialization: None Undermining: Yes Starting Position (o'clock):  12 Ending Position (o'clock): 12 Maximum Distance: (cm) 0.8 Wound Description Classification: Grade 2 Wound Margin: Distinct, outline attached Exudate Amount: Small Exudate Type: Serous Exudate Color: amber Foul Odor After Cleansing: Yes Due to Product Use: No Slough/Fibrino Yes Wound Bed Granulation Amount: None Present (0%) Exposed Structure Necrotic Amount: Small (1-33%) Fascia Exposed: No Necrotic Quality: Adherent Slough Fat Layer (Subcutaneous Tissue) Exposed: Yes Tendon Exposed: No Muscle Exposed: No Joint Exposed: No [name] [name], [name] [name] ([id number]) Bone Exposed: No Periwound Skin Texture Texture Color No Abnormalities Noted: No No Abnormalities Noted: No Callus: Yes Atrophie Blanche: No Crepitus: No Cyanosis: No Excoriation: No Ecchymosis: No Induration: No Erythema: No Rash: No Hemosiderin Staining: No Scarring: No Mottled: No Pallor: No Moisture Rubor: No No Abnormalities Noted: No Dry / Scaly: No Temperature / Pain Maceration: No Temperature: No Abnormality Wound Preparation Ulcer Cleansing: Rinsed/Irrigated with Saline Topical Anesthetic Applied: Other: lidocaine 4%, Treatment Notes Wound #1 (Right, Plantar Toe Great) Notes silvercel, foam, secured with conform and tape, darco front Teacher, adult education Signature(s) Signed: 06/21/2018 2:41:13 PM By: Harold Barban Entered By: Harold Barban on 06/20/2018 10:40:13 [name] [name], [name] [name] ([id number]) -------------------------------------------------------------------------------- Vitals Details Patient Name: [name] [name] Date of Service: 06/20/2018 10:30 AM Medical Record Number: [id number] Patient Account Number: [id number] Date of Birth/Sex: Oct 26, 1960 (57 y.o. M) Treating RN: Montey Hora Primary Care Genasis Zingale: Soyla Dryer Other Clinician: Referring Oseph Imburgia: Soyla Dryer Treating Hoa Deriso/Extender: Tito Dine in Treatment: 21 Vital Signs Time Taken: 10:29 Temperature (F):  97.6 Height (in): 70 Pulse (bpm): 93 Weight (lbs): 139 Respiratory Rate (breaths/min): 16 Body Mass Index (BMI): 19.9 Blood Pressure (mmHg): 146/70 Reference Range: 80 - 120 mg / dl Electronic Signature(s) Signed: 06/20/2018 4:30:10 PM By: Lorine Bears RCP, RRT, CHT Entered By: Lorine Bears on 06/20/2018 10:32:15

## 2018-06-26 ENCOUNTER — Other Ambulatory Visit (HOSPITAL_COMMUNITY)
Admission: RE | Admit: 2018-06-26 | Discharge: 2018-06-26 | Disposition: A | Discharge status: Home / Self Care | Payer: Self-pay | Source: Ambulatory Visit | Attending: Physician Assistant | Admitting: Physician Assistant

## 2018-06-26 DIAGNOSIS — E785 Hyperlipidemia, unspecified: Secondary | ICD-10-CM | POA: Insufficient documentation

## 2018-06-26 DIAGNOSIS — E1169 Type 2 diabetes mellitus with other specified complication: Secondary | ICD-10-CM | POA: Insufficient documentation

## 2018-06-26 DIAGNOSIS — I10 Essential (primary) hypertension: Secondary | ICD-10-CM | POA: Insufficient documentation

## 2018-06-26 LAB — COMPREHENSIVE METABOLIC PANEL
ALT: 14 U/L (ref 0–44)
AST: 10 U/L — ABNORMAL LOW (ref 15–41)
Albumin: 4.2 g/dL (ref 3.5–5.0)
Alkaline Phosphatase: 57 U/L (ref 38–126)
Anion gap: 8 (ref 5–15)
BUN: 21 mg/dL — ABNORMAL HIGH (ref 6–20)
CO2: 26 mmol/L (ref 22–32)
Calcium: 9.8 mg/dL (ref 8.9–10.3)
Chloride: 106 mmol/L (ref 98–111)
Creatinine, Ser: 0.76 mg/dL (ref 0.61–1.24)
GFR calc Af Amer: >60 mL/min (ref >60)
GFR calc non Af Amer: >60 mL/min (ref >60)
Glucose, Bld: 152 mg/dL — ABNORMAL HIGH (ref 70–99)
Potassium: 4.5 mmol/L (ref 3.5–5.1)
SODIUM: 140 mmol/L (ref 135–145)
Total Bilirubin: 0.7 mg/dL (ref 0.3–1.2)
Total Protein: 7.5 g/dL (ref 6.5–8.1)

## 2018-06-26 LAB — LIPID PANEL
Cholesterol: 121 mg/dL (ref 0–200)
HDL: 47 mg/dL (ref >40)
LDL CALC: 40 mg/dL (ref 0–99)
Total CHOL/HDL Ratio: 2.6 RATIO
Triglycerides: 169 mg/dL — ABNORMAL HIGH (ref <150)
VLDL: 34 mg/dL (ref 0–40)

## 2018-06-26 LAB — HEMOGLOBIN A1C
Hgb A1c MFr Bld: 6.3 % — ABNORMAL HIGH (ref 4.8–5.6)
Mean Plasma Glucose: 134.11 mg/dL

## 2018-06-27 ENCOUNTER — Encounter: Payer: Self-pay | Admitting: Internal Medicine

## 2018-06-28 ENCOUNTER — Encounter: Payer: Self-pay | Admitting: Physician Assistant

## 2018-06-28 ENCOUNTER — Ambulatory Visit: Payer: Self-pay | Admitting: Physician Assistant

## 2018-06-28 VITALS — BP 118/71 | HR 102 | Temp 98.1°F | Ht 67.5 in | Wt 132.0 lb

## 2018-06-28 DIAGNOSIS — E1169 Type 2 diabetes mellitus with other specified complication: Secondary | ICD-10-CM

## 2018-06-28 DIAGNOSIS — I10 Essential (primary) hypertension: Secondary | ICD-10-CM

## 2018-06-28 DIAGNOSIS — E785 Hyperlipidemia, unspecified: Secondary | ICD-10-CM

## 2018-06-28 DIAGNOSIS — S91109S Unspecified open wound of unspecified toe(s) without damage to nail, sequela: Secondary | ICD-10-CM

## 2018-06-28 NOTE — Progress Notes (Addendum)
KAYMAN, SNUFFER ([id number]) Visit Report for 06/27/2018 Arrival Information Details Patient Name: [name] [name], [name] [name] Date of Service: 06/27/2018 11:00 AM Medical Record Number: [id number] Patient Account Number: [id number] Date of Birth/Sex: [birth year] (58 y.o. M) Treating RN: Secundino Ginger Primary Care Teddy Pena: Soyla Dryer Other Clinician: Referring Jackquline Branca: Soyla Dryer Treating Giordana Weinheimer/Extender: Tito Dine in Treatment: 68 Visit Information History Since Last Visit Added or deleted any medications: No Patient Arrived: Ambulatory Any new allergies or adverse reactions: No Arrival Time: 10:55 Had a fall or experienced change in No Accompanied By: family activities of daily living that may affect Transfer Assistance: None risk of falls: Patient Identification Verified: Yes Signs or symptoms of abuse/neglect since last visito No Secondary Verification Process Completed: Yes Hospitalized since last visit: No Has Dressing in Place as Prescribed: Yes Pain Present Now: No Electronic Signature(s) Signed: 06/27/2018 3:32:14 PM By: Secundino Ginger Entered By: Secundino Ginger on 06/27/2018 10:57:20 Sharpe, Anel ([id number]) -------------------------------------------------------------------------------- Encounter Discharge Information Details Patient Name: [name] [name] Date of Service: 06/27/2018 11:00 AM Medical Record Number: [id number] Patient Account Number: [id number] Date of Birth/Sex: 1960/11/03 (57 y.o. M) Treating RN: Montey Hora Primary Care Tilmon Wisehart: Soyla Dryer Other Clinician: Referring Nasha Diss: Soyla Dryer Treating Meredith Mells/Extender: Tito Dine in Treatment: 22 Encounter Discharge Information Items Post Procedure Vitals Discharge Condition: Stable Temperature (F): 97.9 Ambulatory Status: Ambulatory Pulse (bpm): 102 Discharge Destination: Home Respiratory Rate (breaths/min): 18 Transportation: Private Auto Blood Pressure (mmHg):  142/69 Accompanied By: mother Schedule Follow-up Appointment: Yes Clinical Summary of Care: Electronic Signature(s) Signed: 06/27/2018 4:53:09 PM By: Montey Hora Entered By: Montey Hora on 06/27/2018 11:57:48 Leib, Hyland ([id number]) -------------------------------------------------------------------------------- Lower Extremity Assessment Details Patient Name: [name] [name] Date of Service: 06/27/2018 11:00 AM Medical Record Number: [id number] Patient Account Number: [id number] Date of Birth/Sex: 1961-01-05 (57 y.o. M) Treating RN: Secundino Ginger Primary Care Khanh Tanori: Soyla Dryer Other Clinician: Referring Tameisha Covell: Soyla Dryer Treating Adarsh Mundorf/Extender: Tito Dine in Treatment: 22 Edema Assessment Assessed: [Left: No] [Right: No] Edema: [Left: N] [Right: o] Calf Left: Right: Point of Measurement: 33 cm From Medial Instep cm 31 cm Ankle Left: Right: Point of Measurement: 14 cm From Medial Instep cm 19 cm Vascular Assessment Pulses: Posterior Tibial Extremity colors, hair growth, and conditions: Extremity Color: [Right:Normal] Hair Growth on Extremity: [Right:No] Temperature of Extremity: [Right:Cool] Capillary Refill: [Right:< 3 seconds] Dependent Rubor: [Right:No] Toe Nail Assessment Left: Right: Thick: No Discolored: No Deformed: No Improper Length and Hygiene: No Electronic Signature(s) Signed: 06/27/2018 3:32:14 PM By: Secundino Ginger Entered By: Secundino Ginger on 06/27/2018 11:06:43 [place], [place] ([id number]) -------------------------------------------------------------------------------- Multi Wound Chart Details Patient Name: [name] [name] Date of Service: 06/27/2018 11:00 AM Medical Record Number: [id number] Patient Account Number: [id number] Date of Birth/Sex: [birth year] (57 y.o. M) Treating RN: Cornell Barman Primary Care Shi Grose: Soyla Dryer Other Clinician: Referring Talayla Doyel: Soyla Dryer Treating Cheryln Balcom/Extender: Tito Dine in Treatment: 22 Vital Signs Height(in): 70 Pulse(bpm): 102 Weight(lbs): 139 Blood Pressure(mmHg): 142/69 Body Mass Index(BMI): 20 Temperature(F): 97.9 Respiratory Rate 18 (breaths/min): Photos: [N/A:N/A] Wound Location: Right Toe Great - Plantar N/A N/A Wounding Event: Gradually Appeared N/A N/A Primary Etiology: Diabetic Wound/Ulcer of the N/A N/A Lower Extremity Comorbid History: Cataracts, Hypertension, Type N/A N/A II Diabetes, Rheumatoid Arthritis, Neuropathy Date Acquired: 12/24/2017 N/A N/A Weeks of Treatment: 22 N/A N/A Wound Status: Open N/A N/A Measurements L x W x D 0.3x0.2x0.5 N/A N/A (cm) Area (cm) : 0.047 N/A N/A Volume (cm) : 0.024 N/A N/A % Reduction in Area: 91.50% N/A N/A %  Reduction in Volume: 92.70% N/A N/A Starting Position 1 9 (o'clock): Ending Position 1 11 (o'clock): Maximum Distance 1 (cm): 0.8 Undermining: Yes N/A N/A Classification: Grade 2 N/A N/A Exudate Amount: None Present N/A N/A Foul Odor After Cleansing: Yes N/A N/A Odor Anticipated Due to No N/A N/A Product Use: Wound Margin: Distinct, outline attached N/A N/A Granulation Amount: None Present (0%) N/A N/A Klauer, Guido ([id number]) Necrotic Amount: Large (67-100%) N/A N/A Necrotic Tissue: Eschar N/A N/A Exposed Structures: Fat Layer (Subcutaneous N/A N/A Tissue) Exposed: Yes Fascia: No Tendon: No Muscle: No Joint: No Bone: No Epithelialization: None N/A N/A Periwound Skin Texture: Callus: Yes N/A N/A Excoriation: No Induration: No Crepitus: No Rash: No Scarring: No Periwound Skin Moisture: Maceration: No N/A N/A Dry/Scaly: No Periwound Skin Color: Atrophie Blanche: No N/A N/A Cyanosis: No Ecchymosis: No Erythema: No Hemosiderin Staining: No Mottled: No Pallor: No Rubor: No Temperature: No Abnormality N/A N/A Tenderness on Palpation: No N/A N/A Wound Preparation: Ulcer Cleansing: N/A N/A Rinsed/Irrigated with Saline Topical Anesthetic  Applied: Other: lidocaine 4% Treatment Notes Electronic Signature(s) Signed: 06/27/2018 5:16:46 PM By: Gretta Cool, BSN, RN, CWS, Kim RN, BSN Entered By: Gretta Cool, BSN, RN, CWS, Kim on 06/27/2018 11:38:05 LISSANDRO, DILORENZO ([id number]) -------------------------------------------------------------------------------- Multi-Disciplinary Care Plan Details Patient Name: [name] [name] Date of Service: 06/27/2018 11:00 AM Medical Record Number: [id number] Patient Account Number: [id number] Date of Birth/Sex: May 01, 1961 (57 y.o. M) Treating RN: Cornell Barman Primary Care Jamani Bearce: Soyla Dryer Other Clinician: Referring Parker Wherley: Soyla Dryer Treating Monita Swier/Extender: Tito Dine in Treatment: 22 Active Inactive Electronic Signature(s) Signed: 07/30/2018 11:33:47 AM By: Gretta Cool, BSN, RN, CWS, Kim RN, BSN Previous Signature: 06/27/2018 5:16:46 PM Version By: Gretta Cool, BSN, RN, CWS, Kim RN, BSN Entered By: Gretta Cool, BSN, RN, CWS, Kim on 07/30/2018 11:33:47 Mimbres, Saadiq ([id number]) -------------------------------------------------------------------------------- Pain Assessment Details Patient Name: [name] [name] Date of Service: 06/27/2018 11:00 AM Medical Record Number: [id number] Patient Account Number: [id number] Date of Birth/Sex: [birth year] (57 y.o. M) Treating RN: Secundino Ginger Primary Care Savayah Waltrip: Soyla Dryer Other Clinician: Referring Christi Wirick: Soyla Dryer Treating Kelleigh Skerritt/Extender: Tito Dine in Treatment: 22 Active Problems Location of Pain Severity and Description of Pain Patient Has Paino No Site Locations Pain Management and Medication Current Pain Management: Goals for Pain Management pt denies any pain at this time. Electronic Signature(s) Signed: 06/27/2018 3:32:14 PM By: Secundino Ginger Entered By: Secundino Ginger on 06/27/2018 10:58:41 Mattila, Jim ([id number]) -------------------------------------------------------------------------------- Patient/Caregiver  Education Details Patient Name: [name] [name] Date of Service: 06/27/2018 11:00 AM Medical Record Number: [id number] Patient Account Number: [id number] Date of Birth/Gender: Mar 07, 1961 (57 y.o. M) Treating RN: Cornell Barman Primary Care Physician: Soyla Dryer Other Clinician: Referring Physician: Soyla Dryer Treating Physician/Extender: Tito Dine in Treatment: 22 Education Assessment Education Provided To: Patient Education Topics Provided Wound/Skin Impairment: Handouts: Caring for Your Ulcer Methods: Demonstration, Explain/Verbal Responses: State content correctly Electronic Signature(s) Signed: 06/27/2018 5:16:46 PM By: Gretta Cool, BSN, RN, CWS, Kim RN, BSN Entered By: Gretta Cool, BSN, RN, CWS, Kim on 06/27/2018 11:46:50 [place], Min ([id number]) -------------------------------------------------------------------------------- Wound Assessment Details Patient Name: [name] [name] Date of Service: 06/27/2018 11:00 AM Medical Record Number: [id number] Patient Account Number: [id number] Date of Birth/Sex: Jan 12, 1961 (57 y.o. M) Treating RN: Secundino Ginger Primary Care Donelda Mailhot: Soyla Dryer Other Clinician: Referring Tonatiuh Mallon: Soyla Dryer Treating Ector Laurel/Extender: Tito Dine in Treatment: 22 Wound Status Wound Number: 1 Primary Diabetic Wound/Ulcer of the Lower Extremity Etiology: Wound Location: Right Toe Great - Plantar Wound Open Wounding Event: Gradually Appeared Status: Date Acquired: 12/24/2017 Comorbid  Cataracts, Hypertension, Type II Diabetes, Weeks Of Treatment: 22 History: Rheumatoid Arthritis, Neuropathy Clustered Wound: No Photos Photo Uploaded By: Secundino Ginger on 06/27/2018 11:11:23 Wound Measurements Length: (cm) 0.3 % Reductio Width: (cm) 0.2 % Reductio Depth: (cm) 0.5 Epithelial Area: (cm) 0.047 Tunneling Volume: (cm) 0.024 Undermini Startin Ending Maximum n in Area: 91.5% n in Volume: 92.7% ization: None : No ng:  Yes g Position (o'clock): 9 Position (o'clock): 11 Distance: (cm) 0.8 Wound Description Classification: Grade 2 Foul Odor Wound Margin: Distinct, outline attached Due to Pr Exudate Amount: None Present Slough/Fi After Cleansing: Yes oduct Use: No brino Yes Wound Bed Granulation Amount: None Present (0%) Exposed Structure Necrotic Amount: Large (67-100%) Fascia Exposed: No Necrotic Quality: Eschar Fat Layer (Subcutaneous Tissue) Exposed: Yes Tendon Exposed: No Muscle Exposed: No Joint Exposed: No Fugitt, Burr ([id number]) Bone Exposed: No Periwound Skin Texture Texture Color No Abnormalities Noted: No No Abnormalities Noted: No Callus: Yes Atrophie Blanche: No Crepitus: No Cyanosis: No Excoriation: No Ecchymosis: No Induration: No Erythema: No Rash: No Hemosiderin Staining: No Scarring: No Mottled: No Pallor: No Moisture Rubor: No No Abnormalities Noted: No Dry / Scaly: No Temperature / Pain Maceration: No Temperature: No Abnormality Wound Preparation Ulcer Cleansing: Rinsed/Irrigated with Saline Topical Anesthetic Applied: Other: lidocaine 4%, Electronic Signature(s) Signed: 06/27/2018 3:32:14 PM By: Secundino Ginger Entered By: Secundino Ginger on 06/27/2018 11:07:27 Davison, Beatrice ([id number]) -------------------------------------------------------------------------------- Vitals Details Patient Name: [name] [name] Date of Service: 06/27/2018 11:00 AM Medical Record Number: [id number] Patient Account Number: [id number] Date of Birth/Sex: [birth year] (57 y.o. M) Treating RN: Secundino Ginger Primary Care Jencarlos Nicolson: Soyla Dryer Other Clinician: Referring Aryahna Spagna: Soyla Dryer Treating Teya Otterson/Extender: Tito Dine in Treatment: 22 Vital Signs Time Taken: 10:55 Temperature (F): 97.9 Height (in): 70 Pulse (bpm): 102 Weight (lbs): 139 Respiratory Rate (breaths/min): 18 Body Mass Index (BMI): 19.9 Blood Pressure (mmHg): 142/69 Reference Range: 80 -  120 mg / dl Airway Electronic Signature(s) Signed: 06/27/2018 3:32:14 PM By: Secundino Ginger Entered BySecundino Ginger on 06/27/2018 10:59:17

## 2018-06-28 NOTE — Progress Notes (Signed)
LEGRANDE, HAO ([id number]) Visit Report for 06/27/2018 Debridement Details Patient Name: [name] [name], [name] [name] Date of Service: 06/27/2018 11:00 AM Medical Record Number: [id number] Patient Account Number: [id number] Date of Birth/Sex: 07-10-1960 (58 y.o. M) Treating RN: Cornell Barman Primary Care Provider: Soyla Dryer Other Clinician: Referring Provider: Soyla Dryer Treating Provider/Extender: Tito Dine in Treatment: 22 Debridement Performed for Wound #1 Right,Plantar Toe Great Assessment: Performed By: Physician Ricard Dillon, MD Debridement Type: Debridement Severity of Tissue Pre Fat layer exposed Debridement: Level of Consciousness (Pre- Awake and Alert procedure): Pre-procedure Verification/Time Yes - 11:40 Out Taken: Start Time: 11:40 Pain Control: Lidocaine Total Area Debrided (L x W): 0.3 (cm) x 0.2 (cm) = 0.06 (cm) Tissue and other material Viable, Non-Viable, Callus, Subcutaneous debrided: Level: Skin/Subcutaneous Tissue Debridement Description: Excisional Instrument: Curette Bleeding: Minimum Hemostasis Achieved: Pressure End Time: 11:45 Response to Treatment: Procedure was tolerated well Level of Consciousness Awake and Alert (Post-procedure): Post Debridement Measurements of Total Wound Length: (cm) 0.3 Width: (cm) 0.3 Depth: (cm) 0.5 Volume: (cm) 0.035 Character of Wound/Ulcer Post Debridement: Requires Further Debridement Severity of Tissue Post Debridement: Fat layer exposed Post Procedure Diagnosis Same as Pre-procedure Electronic Signature(s) Signed: 06/27/2018 4:38:41 PM By: Linton Ham MD Signed: 06/27/2018 5:16:46 PM By: Gretta Cool, BSN, RN, CWS, Kim RN, BSN Entered By: Linton Ham on 06/27/2018 12:35:29 [name], [name] ([id number]) -------------------------------------------------------------------------------- HPI Details Patient Name: [name] [name] Date of Service: 06/27/2018 11:00 AM Medical Record Number:  [id number] Patient Account Number: [id number] Date of Birth/Sex: 05/31/1961 (57 y.o. M) Treating RN: Cornell Barman Primary Care Provider: Soyla Dryer Other Clinician: Referring Provider: Soyla Dryer Treating Provider/Extender: Tito Dine in Treatment: 22 History of Present Illness HPI Description: 01/24/18-He is seen in initial evaluation for right great toe ulcer. He was sent to the emergency room on 718 from the nurse clinic office for new-onset diabetes with an A1c of 12.9. He was discharged from the emergency room on metformin, had a right foot x-ray that was negative for osteomyelitis and had a follow-up with his primary care at the free clinic on 7/22. He has been treated twice with antibiotic therapy; bactrim (7/8) and doxycycline (7/18). He is neuropathic. He tolerated debridement; bone is palpable through a thin layer of tissue; there is, what appears to be, resolving erythema and edema; a culture was taken, antibiotics will be prescribed as needed based on sensitivities. 01/31/18; this is a patient with new onset type 2 diabetes who is on oral agents. Culture from last time showed a few Klebsiella and a few enterococcus is being appropriately treated with a combination of Augmentin and ciprofloxacin for 10 days which I think he just started yesterday. He has to get his medications through the free clinic in [place]. He has no insurance. Been using Prisma to the wound. Previous x-ray last month was negative for osteomyelitis.his ABIs look reasonable. He has an offloading sandal 02/07/18; he completes his antibiotics which are Augmentin and ciprofloxacin on Saturday. Using silver collagen. Once again he has callus around the wound and not a very viable looking wound surface. We have been giving him an offloading sandal but I think he is going to require more offloading than that. Previous x-ray was negative. ABIs in our clinic quite reasonable 02/14/18; he arrives today  with the wound dimensions slightly smaller. Still callus nonviable subcutaneous tissue around the wound circumference. He is still in a offloading shoe rather than the Darco. I transitioned him into the Darco today 02/21/18; slight improvement in dimensions. Still aggressive  debridements using Prisma. He is using a Darco forefoot offloading boot 02/28/18; not much improvement. Still requiring aggressive debridement using Prisma. I changed him to Iodoflex to help with the wound debridement. He is using a Darco forefoot offloading boot but tells me he had 1 fall this week. There is not an option for more aggressive offloading. x-ray I ordered last week was negative for osteomyelitis 03/07/18; not much improvement. He arrives with thick skin subcutaneous tissue overhanging the wound covered and callus. This requires debridement. Post debridement the wound surface looks pretty good. As usual have thoughts about of total contact cast but after talking to the patient and his mother and observing the patient leave the clinic there is absolutely no way he could tolerate a total contact cast. He is barely able to maintain balance and the Darco forefoot boot. I suspect this is secondary to diabetic neuropathy 03/14/18; better looking wound today. Plantar aspect of the right great toe. Still requires debridement but less adherent callus nonviable tissue and less debris over the wound surface. He is tolerating the Darco forefoot boot marginally, not a candidate for a TCC. 03/21/18; wound looks a lot better. Still some depth although the base of the wound looks healthy. He still has a fair amount or raised callus which requires debridement. He is tolerating the Darco forefoot boot. He is not a candidate for a TCC secondary to gait imbalance issues 04/04/18; Patient's wound looked a lot better 2 weeks ago. This week necrotic debris over the surface with considerable undermining. He's been using silver collagen. Our  intake nurse picked up that he may not been using the Darco forefoot off loader secondary to balance issues. We put him back in a surgical shoe today 04/11/18; some improvement in this wound. He still comes in with callus thick skin and subcutaneous tissue over the wound circumference however debriding this today shows it looks better. We put him back in a surgical shoe last week because he had had some falls with a Darco and we were convinced he was using the Darco in any case. We've been using Endoform that we started last week 04/25/18. Wound is generally improved. We are using Endoform.He is offloading this better using a surgical shoe. He cannot tolerate any more aggressive offloading secondary to balance issues 05/02/18; I changed him to Endoform 2 weeks ago and last week this looked better. However he arrives in clinic today with absolutely no improvement. Still requiring debridement. Interestingly it looks as though the great toe had punched a hole in the surface of his healing sandal 05/23/2018; the patient has not been here in 3 weeks. Once again he arrives with a punched hole through his offloading Bajorek, Deontaye ([id number]) healing sandal. I have not really seen this let alone twice in 2 visits. He also has a considerable amount of erythema in the plantar right great toe as well as dorsally. There is swelling and I suspect active infection. Previously he grew enterococcus and Klebsiella out of this wound some months ago. The Klebsiella was Augmentin resistant. He has not been systemically unwell. He claims to be keeping off this but I am doubtful this is the case 05/30/2018; he arrives today with his toe looking better. He is completed the Augmentin and doxycycline I gave him empirically culture of the wound grew Haemophilus parainfluenza beta-lactamase negative and methicillin sensitive staph aureus. The x-ray suggested a progressive lucency involving the head of the first metatarsal  bone which is NOT the exact area  where the wound is located. Nevertheless the the wound on his plantar right great toe really looks quite a bit better. He does not have insurance nevertheless I think an MRI is going to be indicated and we went ahead and ordered this 06/20/2018; arrived with his wound almost totally covered except for a small area with thick callus. However this was obviously not closed. He apparently has been not dressing this for the last week because he ran out of product. MRI was done today. This showed cellulitis of the great toe with findings consistent with osteomyelitis throughout the distal phalanx. Negative for abscess or septic joint. Advanced first MTP osteoarthritis. 06/27/2018; again thick callus and debris over the wound surface on arrival. This requires debridement. oLocal infectious disease refuses to see the man because of absence of a bone specimen and they suggest getting a surgeon to do this. In my experience that is not an easy thing to arrange. I have him on Levaquin which he appears to be tolerating although he is talking about some diarrhea. I also want to give him 2 weeks worth of something for anaerobes in this case Flagyl since it has decent bone penetration which Augmentin does not. Electronic Signature(s) Signed: 06/27/2018 4:38:41 PM By: Linton Ham MD Entered By: Linton Ham on 06/27/2018 12:39:07 MARKY, BURESH ([id number]) -------------------------------------------------------------------------------- Physical Exam Details Patient Name: [name] [name] Date of Service: 06/27/2018 11:00 AM Medical Record Number: [id number] Patient Account Number: [id number] Date of Birth/Sex: 1960-10-12 (57 y.o. M) Treating RN: Cornell Barman Primary Care Provider: Soyla Dryer Other Clinician: Referring Provider: Soyla Dryer Treating Provider/Extender: Tito Dine in Treatment: 22 Constitutional Sitting or standing Blood Pressure is within  target range for patient.. Pulse regular and within target range for patient.Marland Kitchen Respirations regular, non-labored and within target range.. Temperature is normal and within the target range for the patient.Marland Kitchen appears in no distress. Respiratory Respiratory effort is easy and symmetric bilaterally. Rate is normal at rest and on room air.. Cardiovascular Pedal pulses palpable in the right foot. Integumentary (Hair, Skin) No erythema in the toe around the wound. Notes Wound exam; the area on the plantar right great toe. Again necrotic debris over the surface this is the same every week. Using a #5 curette I removed callus skin and subcutaneous tissue and as usual for the most part I am able to get to a reasonable wound surface. I do not think he is offloading this well. There is no erythema no swelling no involvement of the inner phalangeal joint or the MTP. Electronic Signature(s) Signed: 06/27/2018 4:38:41 PM By: Linton Ham MD Entered By: Linton Ham on 06/27/2018 12:42:04 KRISHAV, MAMONE ([id number]) -------------------------------------------------------------------------------- Physician Orders Details Patient Name: [name] [name] Date of Service: 06/27/2018 11:00 AM Medical Record Number: [id number] Patient Account Number: [id number] Date of Birth/Sex: 17-May-1961 (57 y.o. M) Treating RN: Cornell Barman Primary Care Provider: Soyla Dryer Other Clinician: Referring Provider: Soyla Dryer Treating Provider/Extender: Tito Dine in Treatment: 22 Verbal / Phone Orders: No Diagnosis Coding Wound Cleansing Wound #1 Right,Plantar Toe Great o Clean wound with Normal Saline. Anesthetic (add to Medication List) Wound #1 Right,Plantar Toe Great o Topical Lidocaine 4% cream applied to wound bed prior to debridement (In Clinic Only). Primary Wound Dressing Wound #1 Right,Plantar Toe Great o Silver Alginate Secondary Dressing Wound #1 Right,Plantar Toe Great o  Kerlix and Coban Dressing Change Frequency o Three times weekly Follow-up Appointments Wound #1 Right,Plantar Toe Great o Return Appointment in 1 week. Off-Loading Wound #1 Right,Plantar  Toe Great o Open toe surgical shoe with peg assist. Medications-please add to medication list. Wound #1 Right,Plantar Toe Great o P.O. Antibiotics - Continue antibiotics Consults o Infectious Disease - Refer to ID in [facility] Patient Medications Allergies: No Known Allergies Notifications Medication Indication Start End Levaquin osteomyelitis 07/01/2018 DOSE oral 500 mg tablet - 1 tablet oral qdfor an additional 2 weeks metronidazole 06/27/2018 Arakelian, Aylen ([id number]) Notifications Medication Indication Start End diabeteic osteomyelitis DOSE oral 500 mg tablet - 1 tablet tid for 2 weeks Electronic Signature(s) Signed: 06/27/2018 12:47:22 PM By: Linton Ham MD Entered By: Linton Ham on 06/27/2018 12:47:22 [place], [employer] ([id number]) -------------------------------------------------------------------------------- Progress Note Details Patient Name: [name] [name] Date of Service: 06/27/2018 11:00 AM Medical Record Number: [id number] Patient Account Number: [id number] Date of Birth/Sex: [birth year] (57 y.o. M) Treating RN: Cornell Barman Primary Care Provider: Soyla Dryer Other Clinician: Referring Provider: Soyla Dryer Treating Provider/Extender: Tito Dine in Treatment: 22 Subjective History of Present Illness (HPI) 01/24/18-He is seen in initial evaluation for right great toe ulcer. He was sent to the emergency room on 718 from the nurse clinic office for new-onset diabetes with an A1c of 12.9. He was discharged from the emergency room on metformin, had a right foot x-ray that was negative for osteomyelitis and had a follow-up with his primary care at the free clinic on 7/22. He has been treated twice with antibiotic therapy; bactrim (7/8) and doxycycline  (7/18). He is neuropathic. He tolerated debridement; bone is palpable through a thin layer of tissue; there is, what appears to be, resolving erythema and edema; a culture was taken, antibiotics will be prescribed as needed based on sensitivities. 01/31/18; this is a patient with new onset type 2 diabetes who is on oral agents. Culture from last time showed a few Klebsiella and a few enterococcus is being appropriately treated with a combination of Augmentin and ciprofloxacin for 10 days which I think he just started yesterday. He has to get his medications through the free clinic in [place]. He has no insurance. Been using Prisma to the wound. Previous x-ray last month was negative for osteomyelitis.his ABIs look reasonable. He has an offloading sandal 02/07/18; he completes his antibiotics which are Augmentin and ciprofloxacin on Saturday. Using silver collagen. Once again he has callus around the wound and not a very viable looking wound surface. We have been giving him an offloading sandal but I think he is going to require more offloading than that. Previous x-ray was negative. ABIs in our clinic quite reasonable 02/14/18; he arrives today with the wound dimensions slightly smaller. Still callus nonviable subcutaneous tissue around the wound circumference. He is still in a offloading shoe rather than the Darco. I transitioned him into the Darco today 02/21/18; slight improvement in dimensions. Still aggressive debridements using Prisma. He is using a Darco forefoot offloading boot 02/28/18; not much improvement. Still requiring aggressive debridement using Prisma. I changed him to Iodoflex to help with the wound debridement. He is using a Darco forefoot offloading boot but tells me he had 1 fall this week. There is not an option for more aggressive offloading. x-ray I ordered last week was negative for osteomyelitis 03/07/18; not much improvement. He arrives with thick skin subcutaneous tissue  overhanging the wound covered and callus. This requires debridement. Post debridement the wound surface looks pretty good. As usual have thoughts about of total contact cast but after talking to the patient and his mother and observing the patient leave the clinic there is  absolutely no way he could tolerate a total contact cast. He is barely able to maintain balance and the Darco forefoot boot. I suspect this is secondary to diabetic neuropathy 03/14/18; better looking wound today. Plantar aspect of the right great toe. Still requires debridement but less adherent callus nonviable tissue and less debris over the wound surface. He is tolerating the Darco forefoot boot marginally, not a candidate for a TCC. 03/21/18; wound looks a lot better. Still some depth although the base of the wound looks healthy. He still has a fair amount or raised callus which requires debridement. He is tolerating the Darco forefoot boot. He is not a candidate for a TCC secondary to gait imbalance issues 04/04/18; Patient's wound looked a lot better 2 weeks ago. This week necrotic debris over the surface with considerable undermining. He's been using silver collagen. Our intake nurse picked up that he may not been using the Darco forefoot off loader secondary to balance issues. We put him back in a surgical shoe today 04/11/18; some improvement in this wound. He still comes in with callus thick skin and subcutaneous tissue over the wound circumference however debriding this today shows it looks better. We put him back in a surgical shoe last week because he had had some falls with a Darco and we were convinced he was using the Darco in any case. We've been using Endoform that we started last week 04/25/18. Wound is generally improved. We are using Endoform.He is offloading this better using a surgical shoe. He cannot tolerate any more aggressive offloading secondary to balance issues 05/02/18; I changed him to Endoform 2  weeks ago and last week this looked better. However he arrives in clinic today with absolutely no improvement. Still requiring debridement. Interestingly it looks as though the great toe had punched a hole in the surface of his healing sandal Hollick, Mackinley ([id number]) 05/23/2018; the patient has not been here in 3 weeks. Once again he arrives with a punched hole through his offloading healing sandal. I have not really seen this let alone twice in 2 visits. He also has a considerable amount of erythema in the plantar right great toe as well as dorsally. There is swelling and I suspect active infection. Previously he grew enterococcus and Klebsiella out of this wound some months ago. The Klebsiella was Augmentin resistant. He has not been systemically unwell. He claims to be keeping off this but I am doubtful this is the case 05/30/2018; he arrives today with his toe looking better. He is completed the Augmentin and doxycycline I gave him empirically culture of the wound grew Haemophilus parainfluenza beta-lactamase negative and methicillin sensitive staph aureus. The x-ray suggested a progressive lucency involving the head of the first metatarsal bone which is NOT the exact area where the wound is located. Nevertheless the the wound on his plantar right great toe really looks quite a bit better. He does not have insurance nevertheless I think an MRI is going to be indicated and we went ahead and ordered this 06/20/2018; arrived with his wound almost totally covered except for a small area with thick callus. However this was obviously not closed. He apparently has been not dressing this for the last week because he ran out of product. MRI was done today. This showed cellulitis of the great toe with findings consistent with osteomyelitis throughout the distal phalanx. Negative for abscess or septic joint. Advanced first MTP osteoarthritis. 06/27/2018; again thick callus and debris over the wound  surface  on arrival. This requires debridement. Local infectious disease refuses to see the man because of absence of a bone specimen and they suggest getting a surgeon to do this. In my experience that is not an easy thing to arrange. I have him on Levaquin which he appears to be tolerating although he is talking about some diarrhea. I also want to give him 2 weeks worth of something for anaerobes in this case Flagyl since it has decent bone penetration which Augmentin does not. Objective Constitutional Sitting or standing Blood Pressure is within target range for patient.. Pulse regular and within target range for patient.Marland Kitchen Respirations regular, non-labored and within target range.. Temperature is normal and within the target range for the patient.Marland Kitchen appears in no distress. Vitals Time Taken: 10:55 AM, Height: 70 in, Weight: 139 lbs, BMI: 19.9, Temperature: 97.9 F, Pulse: 102 bpm, Respiratory Rate: 18 breaths/min, Blood Pressure: 142/69 mmHg. Respiratory Respiratory effort is easy and symmetric bilaterally. Rate is normal at rest and on room air.. Cardiovascular Pedal pulses palpable in the right foot. General Notes: Wound exam; the area on the plantar right great toe. Again necrotic debris over the surface this is the same every week. Using a #5 curette I removed callus skin and subcutaneous tissue and as usual for the most part I am able to get to a reasonable wound surface. I do not think he is offloading this well. There is no erythema no swelling no involvement of the inner phalangeal joint or the MTP. Integumentary (Hair, Skin) No erythema in the toe around the wound. Wound #1 status is Open. Original cause of wound was Gradually Appeared. The wound is located on the [employer]. The wound measures 0.3cm length x 0.2cm width x 0.5cm depth; 0.047cm^2 area and 0.024cm^3 volume. There is Fat Layer (Subcutaneous Tissue) Exposed exposed. There is no tunneling noted, however,  there is undermining starting at Zaleski, [place] ([id number]) 9:00 and ending at 11:00 with a maximum distance of 0.8cm. There is a none present amount of drainage noted. Foul odor after cleansing was noted. The wound margin is distinct with the outline attached to the wound base. There is no granulation within the wound bed. There is a large (67-100%) amount of necrotic tissue within the wound bed including Eschar. The periwound skin appearance exhibited: Callus. The periwound skin appearance did not exhibit: Crepitus, Excoriation, Induration, Rash, Scarring, Dry/Scaly, Maceration, Atrophie Blanche, Cyanosis, Ecchymosis, Hemosiderin Staining, Mottled, Pallor, Rubor, Erythema. Periwound temperature was noted as No Abnormality. Procedures Wound #1 Pre-procedure diagnosis of Wound #1 is a Diabetic Wound/Ulcer of the Lower Extremity located on the Right,Plantar Toe Great .Severity of Tissue Pre Debridement is: Fat layer exposed. There was a Excisional Skin/Subcutaneous Tissue Debridement with a total area of 0.06 sq cm performed by Ricard Dillon, MD. With the following instrument(s): Curette to remove Viable and Non-Viable tissue/material. Material removed includes Callus and Subcutaneous Tissue and after achieving pain control using Lidocaine. No specimens were taken. A time out was conducted at 11:40, prior to the start of the procedure. A Minimum amount of bleeding was controlled with Pressure. The procedure was tolerated well. Post Debridement Measurements: 0.3cm length x 0.3cm width x 0.5cm depth; 0.035cm^3 volume. Character of Wound/Ulcer Post Debridement requires further debridement. Severity of Tissue Post Debridement is: Fat layer exposed. Post procedure Diagnosis Wound #1: Same as Pre-Procedure Plan Wound Cleansing: Wound #1 Right,Plantar Toe Great: Clean wound with Normal Saline. Anesthetic (add to Medication List): Wound #1 Right,Plantar Toe Great: Topical Lidocaine 4%  cream  applied to wound bed prior to debridement (In Clinic Only). Primary Wound Dressing: Wound #1 Right,Plantar Toe Great: Silver Alginate Secondary Dressing: Wound #1 Right,Plantar Toe Great: Kerlix and Coban Dressing Change Frequency: Three times weekly Follow-up Appointments: Wound #1 Right,Plantar Toe Great: Return Appointment in 1 week. Off-Loading: Wound #1 Right,Plantar Toe Great: Open toe surgical shoe with peg assist. Medications-please add to medication list.: Wound #1 Right,Plantar Toe Great: P.O. Antibiotics - Continue antibiotics Consults ordered were: Infectious Disease - Refer to ID in [place], [place] ([id number]) The following medication(s) was prescribed: Levaquin oral 500 mg tablet 1 tablet oral qdfor an additional 2 weeks for osteomyelitis starting 07/01/2018 metronidazole oral 500 mg tablet 1 tablet tid for 2 weeks for diabeteic osteomyelitis starting 06/27/2018 1. Extensive debridement on the right great toe once again reveals a reasonably healthy surface. I do not think he is offloading this adequately. He is not a candidate for a total contact cast we tried that he did not tolerate it. We are going to continue to use silver alginate 2. I have added an additional 2 weeks of Levaquin as well as Flagyl for 2 weeks to cope with the usual diabetic polymicrobial and possibly anaerobic infections. I think the toe looks better in terms of swelling and erythema. 3. Once again I have had trouble with infectious disease in the issue of going into this foot further to obtain a bone culture. I do not disagree with the concept however trying to find a surgeon to do this is not easy 4. The patient does not have a vascular issue. Electronic Signature(s) Signed: 06/27/2018 4:38:41 PM By: Linton Ham MD Entered By: Linton Ham on 06/27/2018 12:50:36 [place], Elizer ([id number]) -------------------------------------------------------------------------------- [place]  Details Patient Name: [name] [name] Date of Service: 06/27/2018 Medical Record Number: [id number] Patient Account Number: [id number] Date of Birth/Sex: 10-31-1960 (57 y.o. 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, right ankle and foot Facility Procedures CPT4 Code Description: [id number] 11042 - DEB SUBQ TISSUE 20 SQ CM/< ICD-10 Diagnosis Description E11.621 Type 2 diabetes mellitus with foot ulcer L97.513 Non-pressure chronic ulcer of other part of right foot with necr Modifier: osis of muscle Quantity: 1 Physician Procedures CPT4 Code Description: [id number] 70623 - WC PHYS SUBQ TISS 20 SQ CM ICD-10 Diagnosis Description E11.621 Type 2 diabetes mellitus with foot ulcer L97.513 Non-pressure chronic ulcer of other part of right foot with necr Modifier: osis of muscle Quantity: 1 Electronic Signature(s) Signed: 06/27/2018 4:38:41 PM By: Linton Ham MD Entered By: Linton Ham on 06/27/2018 12:51:28

## 2018-06-28 NOTE — Progress Notes (Signed)
BP 118/71 (BP Location: Right Arm, Patient Position: Sitting, Cuff Size: Normal)   Pulse (!) 102   Temp 98.1 F (36.7 C)   Ht 5' 7.5" (1.715 m)   Wt 132 lb (59.9 kg)   SpO2 100%   BMI 20.37 kg/m    Subjective:    Patient ID: [name] [name], male    DOB: [birth year], 58 y.o.   MRN: [id number]  HPI: [name] [name] is a 58 y.o. male presenting on 06/28/2018 for Diabetes; Hyperlipidemia; and Hypertension   HPI Pt doing okay.  He has no complaints today.  He is continuing with wound clinic for toe infection.  Relevant past medical, surgical, family and social history reviewed and updated as indicated. Interim medical history since our last visit reviewed. Allergies and medications reviewed and updated.   Current Outpatient Medications:  .  acetaminophen (TYLENOL) 500 MG tablet, Take 500 mg by mouth every 6 (six) hours as needed (arthritis). , Disp: , Rfl:  .  atorvastatin (LIPITOR) 20 MG tablet, Take 1 tablet (20 mg total) by mouth daily., Disp: 90 tablet, Rfl: 1 .  Calcium Carbonate-Simethicone (ALKA-SELTZER HEARTBURN + GAS) 750-80 MG CHEW, Chew 1 tablet by mouth daily as needed., Disp: , Rfl:  .  famotidine (PEPCID) 20 MG tablet, Take 1 tablet (20 mg total) by mouth 2 (two) times daily as needed for heartburn or indigestion., Disp: 60 tablet, Rfl: 3 .  levofloxacin (LEVAQUIN) 500 MG tablet, Take 500 mg by mouth daily., Disp: , Rfl:  .  lisinopril (PRINIVIL,ZESTRIL) 10 MG tablet, TAKE 1 Tablet BY MOUTH ONCE DAILY, Disp: 90 tablet, Rfl: 0 .  metFORMIN (GLUCOPHAGE) 1000 MG tablet, Take 1 tablet (1,000 mg total) by mouth 2 (two) times daily with a meal., Disp: 180 tablet, Rfl: 1 .  metroNIDAZOLE (FLAGYL) 500 MG tablet, Take 500 mg by mouth 3 (three) times daily., Disp: , Rfl:  .  sitaGLIPtin (JANUVIA) 100 MG tablet, Take 1 tablet (100 mg total) by mouth daily., Disp: 90 tablet, Rfl: 1 .  Omega-3 Fatty Acids (FISH OIL) 1200 MG CAPS, Take 1 capsule (1,200 mg total) by mouth 2 (two) times  daily. (Patient not taking: Reported on 06/28/2018), Disp: , Rfl:     Review of Systems  Constitutional: Positive for appetite change and unexpected weight change. Negative for chills, diaphoresis, fatigue and fever.  HENT: Negative for congestion, dental problem, drooling, ear pain, facial swelling, hearing loss, mouth sores, sneezing, sore throat, trouble swallowing and voice change.   Eyes: Positive for pain and visual disturbance. Negative for discharge, redness and itching.  Respiratory: Negative for cough, choking, shortness of breath and wheezing.   Cardiovascular: Negative for chest pain, palpitations and leg swelling.  Gastrointestinal: Negative for abdominal pain, blood in stool, constipation, diarrhea and vomiting.  Endocrine: Negative for cold intolerance, heat intolerance and polydipsia.  Genitourinary: Negative for decreased urine volume, dysuria and hematuria.  Musculoskeletal: Positive for arthralgias and gait problem. Negative for back pain.  Skin: Negative for rash.  Allergic/Immunologic: Negative for environmental allergies.  Neurological: Negative for seizures, syncope, light-headedness and headaches.  Hematological: Negative for adenopathy.  Psychiatric/Behavioral: Negative for agitation, dysphoric mood and suicidal ideas. The patient is not nervous/anxious.     Per HPI unless specifically indicated above     Objective:    BP 118/71 (BP Location: Right Arm, Patient Position: Sitting, Cuff Size: Normal)   Pulse (!) 102   Temp 98.1 F (36.7 C)   Ht 5' 7.5" (1.715 m)   Wt  132 lb (59.9 kg)   SpO2 100%   BMI 20.37 kg/m   Wt Readings from Last 3 Encounters:  06/28/18 132 lb (59.9 kg)  02/14/18 134 lb 8 oz (61 kg)  01/15/18 139 lb (63 kg)    Physical Exam Vitals signs reviewed.  Constitutional:      Appearance: He is well-developed.  HENT:     Head: Normocephalic and atraumatic.  Neck:     Musculoskeletal: Neck supple.  Cardiovascular:     Rate and Rhythm:  Normal rate and regular rhythm.  Pulmonary:     Effort: Pulmonary effort is normal.     Breath sounds: Normal breath sounds. No wheezing.  Abdominal:     General: Bowel sounds are normal.     Palpations: Abdomen is soft.     Tenderness: There is no abdominal tenderness.  Lymphadenopathy:     Cervical: No cervical adenopathy.  Skin:    General: Skin is warm and dry.  Neurological:     Mental Status: He is alert and oriented to person, place, and time.  Psychiatric:        Behavior: Behavior normal.     Results for orders placed or performed during the hospital encounter of 06/26/18  Lipid panel  Result Value Ref Range   Cholesterol 121 0 - 200 mg/dL   Triglycerides 169 (H) <150 mg/dL   HDL 47 >40 mg/dL   Total CHOL/HDL Ratio 2.6 RATIO   VLDL 34 0 - 40 mg/dL   LDL Cholesterol 40 0 - 99 mg/dL  Comprehensive metabolic panel  Result Value Ref Range   Sodium 140 135 - 145 mmol/L   Potassium 4.5 3.5 - 5.1 mmol/L   Chloride 106 98 - 111 mmol/L   CO2 26 22 - 32 mmol/L   Glucose, Bld 152 (H) 70 - 99 mg/dL   BUN 21 (H) 6 - 20 mg/dL   Creatinine, Ser 0.76 0.61 - 1.24 mg/dL   Calcium 9.8 8.9 - 10.3 mg/dL   Total Protein 7.5 6.5 - 8.1 g/dL   Albumin 4.2 3.5 - 5.0 g/dL   AST 10 (L) 15 - 41 U/L   ALT 14 0 - 44 U/L   Alkaline Phosphatase 57 38 - 126 U/L   Total Bilirubin 0.7 0.3 - 1.2 mg/dL   GFR calc non Af Amer >60 >60 mL/min   GFR calc Af Amer >60 >60 mL/min   Anion gap 8 5 - 15  Hemoglobin A1c  Result Value Ref Range   Hgb A1c MFr Bld 6.3 (H) 4.8 - 5.6 %   Mean Plasma Glucose 134.11 mg/dL      Assessment & Plan:   Encounter Diagnoses  Name Primary?  . Type 2 diabetes mellitus with other specified complication, unspecified whether long term insulin use ([place]) Yes  . Hyperlipidemia, unspecified hyperlipidemia type   . Essential hypertension   . Open toe wound, sequela     -reviewed labs with pt -pt to continue current medications -pt to continue with wound clinic for  toe infection -pt to follow up 3 months.  RTO sooner prn

## 2018-07-04 ENCOUNTER — Ambulatory Visit: Payer: Self-pay | Admitting: Internal Medicine

## 2018-07-11 ENCOUNTER — Other Ambulatory Visit: Payer: Self-pay | Admitting: Physician Assistant

## 2018-07-12 ENCOUNTER — Encounter: Payer: Self-pay | Admitting: Internal Medicine

## 2018-07-12 ENCOUNTER — Ambulatory Visit (INDEPENDENT_AMBULATORY_CARE_PROVIDER_SITE_OTHER): Payer: Self-pay | Admitting: Internal Medicine

## 2018-07-12 DIAGNOSIS — G629 Polyneuropathy, unspecified: Secondary | ICD-10-CM

## 2018-07-12 DIAGNOSIS — M869 Osteomyelitis, unspecified: Secondary | ICD-10-CM

## 2018-07-12 MED ORDER — LEVOFLOXACIN 500 MG PO TABS: 500.0000 mg | ORAL_TABLET | Freq: Every day | ORAL | 1 refills | Status: DC

## 2018-07-12 MED ORDER — METRONIDAZOLE 500 MG PO TABS: 500.0000 mg | ORAL_TABLET | Freq: Three times a day (TID) | ORAL | 1 refills | Status: DC

## 2018-07-12 NOTE — Assessment & Plan Note (Signed)
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  He has responded well to a 2-week course of oral levofloxacin and metronidazole. I reviewed treatment options with him including extending the course of his current treatment versus intravenous antibiotics.  I feel that extending his current oral antibiotics is the best option.  I will check lab work for his inflammatory markers today and refill his levofloxacin and metronidazole for at least another 4 weeks.  He will follow-up here in 4 weeks.

## 2018-07-12 NOTE — Progress Notes (Addendum)
[facility] for Infectious Disease  Reason for Consult: Right great toe osteomyelitis Referring Provider: Dr. 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  He has responded well to a 2-week course of oral levofloxacin and metronidazole. I reviewed treatment options with him including extending the course of his current treatment versus intravenous antibiotics.  I feel that extending his current oral antibiotics is the best option.  I will check lab work for his inflammatory markers today and refill his levofloxacin and metronidazole for at least another 4 weeks.  He will follow-up here in 4 weeks.   Plan: 1. Check sed rate and C-reactive protein 2. Continue oral levofloxacin and metronidazole for 4 more weeks 3. Follow-up here in 4 weeks  Patient Active Problem List   Diagnosis Date Noted  . Osteomyelitis of great toe of right foot ([place]) 07/12/2018    Priority: High  . Neuropathy 07/12/2018  . Essential hypertension 02/14/2018  . Uncontrolled type 2 diabetes mellitus with hyperglycemia ([place]) 01/08/2018  . Hyperlipidemia 01/08/2018    Patient's Medications  New Prescriptions   No medications on file  Previous Medications   ACETAMINOPHEN (TYLENOL) 500 MG TABLET    Take 500 mg by mouth every 6 (six) hours as needed (arthritis).    ATORVASTATIN (LIPITOR) 20 MG TABLET    TAKE 1 Tablet BY MOUTH ONCE DAILY   CALCIUM CARBONATE-SIMETHICONE (ALKA-SELTZER HEARTBURN + GAS) 750-80 MG CHEW    Chew 1 tablet by mouth daily as needed.   FAMOTIDINE (PEPCID) 20 MG TABLET    Take 1 tablet (20 mg total) by mouth 2 (two) times daily as needed for heartburn or indigestion.   LEVOFLOXACIN (LEVAQUIN) 500 MG TABLET    Take 500 mg by mouth daily.   LISINOPRIL (PRINIVIL,ZESTRIL) 10 MG TABLET    TAKE 1 Tablet BY MOUTH ONCE DAILY   METFORMIN (GLUCOPHAGE) 1000 MG TABLET    TAKE 1  Tablet  BY MOUTH TWICE DAILY WITH MEALS   METRONIDAZOLE (FLAGYL) 500 MG TABLET    Take 500 mg by mouth 3 (three) times daily.   OMEGA-3 FATTY ACIDS (FISH OIL) 1200 MG CAPS    Take 1 capsule (1,200 mg total) by mouth 2 (two) times daily.   SITAGLIPTIN (JANUVIA) 100 MG TABLET    Take 1 tablet (100 mg total) by mouth daily.  Modified Medications   No medications on file  Discontinued Medications   No medications on file    HPI: [name] [name] is a 58 y.o. male who was diagnosed with diabetes and neuropathy last year.  He has been bothered by a tough callus on the plantar surface of his right great toe and developed ulceration in July.  He was treated with a short course of trimethoprim sulfamethoxazole followed by a short course of doxycycline and then referred to the wound center in [place], [place] in August.  He underwent some debridement of the ulcer.  Cultures grew Klebsiella oxytoca resistant to ampicillin sulbactam and enterococcus.  He received a 10-day course of ciprofloxacin and amoxicillin clavulanate on 01/30/2018.  He seemed to be improving.  A plain x-ray on 02/21/2018 did not show any evidence of osteomyelitis.  He developed redness and diffuse swelling of his right great toe in early December.  His wound was debrided again and cultures grew MSSA and a beta lactamase negative Haemophilus parainfluenza.  He was treated with a short course of doxycycline and amoxicillin clavulanate.  An MRI done on 06/20/2018 showed cellulitis of the toe and evidence of osteomyelitis in the distal phalanx.  There was no evidence of abscess or septic arthritis.  He was started on oral levofloxacin and metronidazole 2 weeks ago.  He took his last dose yesterday.  He has noted significant improvement.  The swelling and redness have resolved and he is no longer having any drainage.  He has tolerated his antibiotics reasonably well other than some intermittent abdominal cramping and more frequent soft stools.  He  has not had any watery diarrhea.  Review of Systems: Review of Systems  Constitutional: Positive for weight loss. Negative for chills, diaphoresis and fever.  Gastrointestinal: Positive for abdominal pain. Negative for diarrhea, nausea and vomiting.       He has a metallic taste in his mouth and some anorexia.  He has lost about 3 pounds.  Musculoskeletal: Negative for joint pain.  Skin: Negative for rash.      Past Medical History:  Diagnosis Date  . Arthritis   . Diabetes mellitus without complication ([place]) 09/38/1829  . GERD (gastroesophageal reflux disease)     Social History   Tobacco Use  . Smoking status: Never Smoker  . Smokeless tobacco: Never Used  Substance Use Topics  . Alcohol use: Never    Frequency: Never  . Drug use: Never    Family History  Problem Relation Age of Onset  . Hypertension Mother   . Diabetes Mother   . Cancer Father        prostate cancer  . Hypertension Maternal Grandfather   . Rheum arthritis Maternal Grandmother    No Known Allergies  OBJECTIVE: Vitals:   07/12/18 1003  BP: (!) 150/92  Pulse: (!) 101  Temp: 97.9 F (36.6 C)  Weight: 133 lb (60.3 kg)   Body mass index is 20.52 kg/m.   Physical Exam Constitutional:      Comments: He is quiet, soft-spoken and appears slightly worried but otherwise in no distress.  He is accompanied by his wife.  Musculoskeletal:     Comments: His right foot is warm and appears well perfused.  He has good pulses in his foot.  He has a thick callus on the medial side of his right great toe.  There is a small central ulceration on the plantar surface.  It does not probe deeply.  There is no drainage or odor.  There is no unusual swelling or redness noted.  Psychiatric:        Mood and Affect: Mood normal.     Microbiology: No results found for this or any previous visit (from the past 240 hour(s)).  Michel Bickers, MD [facility] for [facility] [phone] pager   [phone] cell 07/12/2018, 10:42 AM

## 2018-07-13 LAB — SEDIMENTATION RATE: Sed Rate: 11 mm/h (ref 0–20)

## 2018-07-13 LAB — C-REACTIVE PROTEIN: CRP: 0.4 mg/L (ref <8.0)

## 2018-07-19 ENCOUNTER — Telehealth: Payer: Self-pay | Admitting: Behavioral Health

## 2018-07-19 ENCOUNTER — Encounter: Payer: Self-pay | Admitting: Internal Medicine

## 2018-07-19 ENCOUNTER — Ambulatory Visit (INDEPENDENT_AMBULATORY_CARE_PROVIDER_SITE_OTHER): Payer: Self-pay | Admitting: Internal Medicine

## 2018-07-19 DIAGNOSIS — M869 Osteomyelitis, unspecified: Secondary | ICD-10-CM

## 2018-07-19 NOTE — Progress Notes (Signed)
[facility] for Infectious Disease  Patient Active Problem List   Diagnosis Date Noted  . Osteomyelitis of great toe of right foot ([place]) 07/12/2018    Priority: High  . Neuropathy 07/12/2018  . Essential hypertension 02/14/2018  . Uncontrolled type 2 diabetes mellitus with hyperglycemia ([place]) 01/08/2018  . Hyperlipidemia 01/08/2018    Patient's Medications  New Prescriptions   No medications on file  Previous Medications   ACETAMINOPHEN (TYLENOL) 500 MG TABLET    Take 500 mg by mouth every 6 (six) hours as needed (arthritis).    ATORVASTATIN (LIPITOR) 20 MG TABLET    TAKE 1 Tablet BY MOUTH ONCE DAILY   CALCIUM CARBONATE-SIMETHICONE (ALKA-SELTZER HEARTBURN + GAS) 750-80 MG CHEW    Chew 1 tablet by mouth daily as needed.   FAMOTIDINE (PEPCID) 20 MG TABLET    Take 1 tablet (20 mg total) by mouth 2 (two) times daily as needed for heartburn or indigestion.   LEVOFLOXACIN (LEVAQUIN) 500 MG TABLET    Take 1 tablet (500 mg total) by mouth daily.   LISINOPRIL (PRINIVIL,ZESTRIL) 10 MG TABLET    TAKE 1 Tablet BY MOUTH ONCE DAILY   METFORMIN (GLUCOPHAGE) 1000 MG TABLET    TAKE 1 Tablet  BY MOUTH TWICE DAILY WITH MEALS   METRONIDAZOLE (FLAGYL) 500 MG TABLET    Take 1 tablet (500 mg total) by mouth 3 (three) times daily.   OMEGA-3 FATTY ACIDS (FISH OIL) 1200 MG CAPS    Take 1 capsule (1,200 mg total) by mouth 2 (two) times daily.   SITAGLIPTIN (JANUVIA) 100 MG TABLET    Take 1 tablet (100 mg total) by mouth daily.  Modified Medications   No medications on file  Discontinued Medications   No medications on file    Subjective: Mr. Hudock is seen on a work in basis today.  He has now completed 3 weeks of oral levofloxacin and metronidazole for osteomyelitis of the distal phalanx of his right great toe.  He became concerned last night that he had more redness pain and swelling in his toe.  He has not had any drainage from the plantar ulcer.  His pain has improved today.  His wife does  not think his foot looks any more swollen than normal.  They both wonder if the redness may be related to the plastic tape he has been using.  He has not had any fever, chills or sweats.  He still has the metallic taste in his mouth and a poor appetite.  He is having more frequent (3-4 times daily) soft bowel movements but no watery diarrhea.  Review of Systems: Review of Systems  Constitutional: Positive for malaise/fatigue and weight loss. Negative for chills, diaphoresis and fever.  Gastrointestinal:       As noted in HPI.  Musculoskeletal: Positive for joint pain.  Skin: Negative for rash.    Past Medical History:  Diagnosis Date  . Arthritis   . Diabetes mellitus without complication ([place]) 21/30/8657  . GERD (gastroesophageal reflux disease)     Social History   Tobacco Use  . Smoking status: Never Smoker  . Smokeless tobacco: Never Used  Substance Use Topics  . Alcohol use: Never    Frequency: Never  . Drug use: Never    Family History  Problem Relation Age of Onset  . Hypertension Mother   . Diabetes Mother   . Cancer Father        prostate cancer  .  Hypertension Maternal Grandfather   . Rheum arthritis Maternal Grandmother     No Known Allergies  Objective: Vitals:   07/19/18 1401  BP: (!) 144/90  Pulse: (!) 112  Temp: 98 F (36.7 C)  Weight: 132 lb (59.9 kg)   Body mass index is 20.37 kg/m.  Physical Exam Constitutional:      Comments: He appears slightly worried but otherwise in no distress.  He is accompanied by his wife.  Musculoskeletal:     Comments: The thick callus on the medial and plantar surface of his right great toe is unchanged.  There is also no change in the superficial ulceration on the plantar surface.  There is no drainage or odor.  There is only minimal redness and it is confined to where the plastic tape was in contact with his skin.  He has no pain with range of motion of his great toe.  There is no unusual swelling.    Psychiatric:        Mood and Affect: Mood normal.           Lab Results Sed Rate (mm/h)  Date Value  07/12/2018 11   CRP (mg/L)  Date Value  07/12/2018 0.4     Problem List Items Addressed This Visit      High   Osteomyelitis of great toe of right foot ([place])    I think that he is actually doing better on broad empiric therapy for focal osteomyelitis of his right great toe.  Overall it is looking better since starting antibiotics 3 weeks ago and his inflammatory markers are completely normal.  I think the redness may be a reaction to excessive plastic tape.  He is having some intolerance of his antibiotics, most likely is metronidazole, but feels like he can tolerate them for the final 3 weeks of therapy.  I instructed him to wash his foot with soap and water, use moisturizing cream liberally and apply a simple 2 x 2 gauze dressing over the plantar surface secured with 1 round of paper tape.  He will follow-up here on 08/15/2018.          Michel Bickers, MD [facility] for [facility] [phone] pager   [phone] cell 07/19/2018, 2:33 PM

## 2018-07-19 NOTE — Assessment & Plan Note (Signed)
I think that he is actually doing better on broad empiric therapy for focal osteomyelitis of his right great toe.  Overall it is looking better since starting antibiotics 3 weeks ago and his inflammatory markers are completely normal.  I think the redness may be a reaction to excessive plastic tape.  He is having some intolerance of his antibiotics, most likely is metronidazole, but feels like he can tolerate them for the final 3 weeks of therapy.  I instructed him to wash his foot with soap and water, use moisturizing cream liberally and apply a simple 2 x 2 gauze dressing over the plantar surface secured with 1 round of paper tape.  He will follow-up here on 08/15/2018.

## 2018-07-19 NOTE — Telephone Encounter (Signed)
Patient will be able to come today 07/19/2018 at 2pm to see Dr. Megan Salon.  Patient is aware of the appointment. Pricilla Riffle RN

## 2018-07-19 NOTE — Telephone Encounter (Signed)
Please see if he is able to come in to see me today at 2 PM.

## 2018-07-19 NOTE — Telephone Encounter (Signed)
Patient called this morning stating his right great toe is more red, warm to the touch and painful in the joint.  He also stated the pad of his foot is red and painful.  He states he wonders if this is due to the adhesive tape he was using however he states that would not explain the joint pain.   Patient states these are new symptoms and he is wondering if he needs to come in to be seen sooner than August 15, 2018 which is the date of his follow up appointment.  Informed him Dr. Megan Salon would be made aware and would give the patient a call back with an update. Pricilla Riffle RN

## 2018-08-15 ENCOUNTER — Encounter: Payer: Self-pay | Admitting: Internal Medicine

## 2018-08-15 ENCOUNTER — Ambulatory Visit (INDEPENDENT_AMBULATORY_CARE_PROVIDER_SITE_OTHER): Payer: Self-pay | Admitting: Internal Medicine

## 2018-08-15 DIAGNOSIS — M869 Osteomyelitis, unspecified: Secondary | ICD-10-CM

## 2018-08-15 NOTE — Progress Notes (Signed)
[facility]  Patient Active Problem List   Diagnosis Date Noted  . Osteomyelitis of great toe of right foot ([place]) 07/12/2018    Priority: High  . Neuropathy 07/12/2018  . Essential hypertension 02/14/2018  . Uncontrolled type 2 diabetes mellitus with hyperglycemia ([place]) 01/08/2018  . Hyperlipidemia 01/08/2018    Patient's Medications  New Prescriptions   No medications on file  Previous Medications   ACETAMINOPHEN (TYLENOL) 500 MG TABLET    Take 500 mg by mouth every 6 (six) hours as needed (arthritis).    ATORVASTATIN (LIPITOR) 20 MG TABLET    TAKE 1 Tablet BY MOUTH ONCE DAILY   CALCIUM CARBONATE-SIMETHICONE (ALKA-SELTZER HEARTBURN + GAS) 750-80 MG CHEW    Chew 1 tablet by mouth daily as needed.   FAMOTIDINE (PEPCID) 20 MG TABLET    Take 1 tablet (20 mg total) by mouth 2 (two) times daily as needed for heartburn or indigestion.   LISINOPRIL (PRINIVIL,ZESTRIL) 10 MG TABLET    TAKE 1 Tablet BY MOUTH ONCE DAILY   METFORMIN (GLUCOPHAGE) 1000 MG TABLET    TAKE 1 Tablet  BY MOUTH TWICE DAILY WITH MEALS   OMEGA-3 FATTY ACIDS (FISH OIL) 1200 MG CAPS    Take 1 capsule (1,200 mg total) by mouth 2 (two) times daily.   SITAGLIPTIN (JANUVIA) 100 MG TABLET    Take 1 tablet (100 mg total) by mouth daily.  Modified Medications   No medications on file  Discontinued Medications   LEVOFLOXACIN (LEVAQUIN) 500 MG TABLET    Take 1 tablet (500 mg total) by mouth daily.   METRONIDAZOLE (FLAGYL) 500 MG TABLET    Take 1 tablet (500 mg total) by mouth 3 (three) times daily.    Subjective: [name] [name] is in for his routine follow-up visit.  He has been on levofloxacin and metronidazole for right great toe osteomyelitis and was due to complete 6 weeks of therapy on 08/09/2018.  However he has been bothered by nausea, anorexia and frequent soft stools.  He has lost another 5 pounds.  As a result he began skipping doses of his antibiotics and still has a few doses of each  left now.  He is not having any pain in his right great toe  Review of Systems: Review of Systems  Constitutional: Positive for malaise/fatigue and weight loss. Negative for chills, diaphoresis and fever.  Gastrointestinal: Positive for nausea. Negative for abdominal pain, diarrhea and vomiting.  Musculoskeletal: Negative for joint pain.  Skin: Negative for rash.    Past Medical History:  Diagnosis Date  . Arthritis   . Diabetes mellitus without complication ([place]) 43/32/9518  . GERD (gastroesophageal reflux disease)     Social History   Tobacco Use  . Smoking status: Never Smoker  . Smokeless tobacco: Never Used  Substance Use Topics  . Alcohol use: Never    Frequency: Never  . Drug use: Never    Family History  Problem Relation Age of Onset  . Hypertension Mother   . Diabetes Mother   . Cancer Father        prostate cancer  . Hypertension Maternal Grandfather   . Rheum arthritis Maternal Grandmother     No Known Allergies  Objective: Vitals:   08/15/18 1422  BP: (!) 164/100  Pulse: (!) 105  Temp: (!) 97.4 F (36.3 C)  Weight: 122 lb (55.3 kg)   Body mass index is 18.83 kg/m.  Physical Exam Constitutional:  Comments: His weight is actually down 10 pounds over the past month.  He is pale and weak.  Cardiovascular:     Rate and Rhythm: Normal rate and regular rhythm.     Heart sounds: No murmur.  Pulmonary:     Effort: Pulmonary effort is normal.     Breath sounds: Normal breath sounds.  Abdominal:     Palpations: Abdomen is soft.     Tenderness: There is no abdominal tenderness.  Musculoskeletal:     Comments: Only faint redness over the dorsum of the right great toe.  The plantar ulcer has filled then but he still has extensive callus on the medial and plantar surface of that toe.         Lab Results Sed Rate (mm/h)  Date Value  07/12/2018 11   CRP (mg/L)  Date Value  07/12/2018 0.4     Problem List Items Addressed This Visit        High   Osteomyelitis of great toe of right foot ([place])    I am very hopeful that the distal osteomyelitis of his right great toe has been treated and cured at this point.  He is having significant problems tolerating his antibiotics, most likely due to his metronidazole.  I asked him to stop his antibiotics now.  He will follow-up in 1 month.          Michel Bickers, MD [facility] for [facility] [phone] pager   [phone] cell 08/15/2018, 3:00 PM

## 2018-08-15 NOTE — Assessment & Plan Note (Signed)
I am very hopeful that the distal osteomyelitis of his right great toe has been treated and cured at this point.  He is having significant problems tolerating his antibiotics, most likely due to his metronidazole.  I asked him to stop his antibiotics now.  He will follow-up in 1 month.

## 2018-09-18 ENCOUNTER — Ambulatory Visit (INDEPENDENT_AMBULATORY_CARE_PROVIDER_SITE_OTHER): Payer: Self-pay | Admitting: Internal Medicine

## 2018-09-18 ENCOUNTER — Other Ambulatory Visit: Payer: Self-pay

## 2018-09-18 ENCOUNTER — Encounter: Payer: Self-pay | Admitting: Internal Medicine

## 2018-09-18 DIAGNOSIS — M869 Osteomyelitis, unspecified: Secondary | ICD-10-CM

## 2018-09-18 NOTE — Progress Notes (Signed)
Virtual Visit via Telephone Note  I connected with [name] [name] on 09/18/18 at  2:30 PM EDT by telephone and verified that I am speaking with the correct person using two identifiers.   I discussed the limitations, risks, security and privacy concerns of performing an evaluation and management service by telephone and the availability of in person appointments. I also discussed with the patient that there may be a patient responsible charge related to this service. The patient expressed understanding and agreed to proceed.   History of Present Illness: I spoke with [name] [name] by phone today.  He completed his last round antibiotics with levofloxacin and metronidazole on 08/15/2018.  He tells me that he is doing better now.  He says that the redness of his right great toe has resolved.  The thick calluses on the medial and plantar surfaces have started to peel off and are looking better.  He is not having any significant pain.  There is no drainage from the ulcers.  He has not had any fever, nausea, vomiting or diarrhea.  He has not been back to the wound center.  He recently saw a physician for a disability exam.  He was told that his blood pressure was low.  He was told to stop his lisinopril.  He took his blood pressure this morning and it was 150/91.  He says that he has not been able to get a hold of his PCP and his routine follow-up visit has been changed until sometime in May.   Observations/Objective:   Assessment and Plan: His right great toe continues to improve following 6 weeks of empiric antibiotic therapy.  I am hopeful that his cellulitis and osteomyelitis has been cured.  I asked him to contact me if he has any new findings suggestive of recurrent infection.  I suggested that he continue to monitor his blood pressure at home and try to contact his PCP if his blood pressure continues to go back up.  Follow Up Instructions: Follow-up here as needed   I discussed the assessment and  treatment plan with the patient. The patient was provided an opportunity to ask questions and all were answered. The patient agreed with the plan and demonstrated an understanding of the instructions.   The patient was advised to call back or seek an in-person evaluation if the symptoms worsen or if the condition fails to improve as anticipated.  I provided 14 minutes of non-face-to-face time during this encounter.   Michel Bickers, MD

## 2018-09-24 ENCOUNTER — Telehealth: Payer: Self-pay | Admitting: Student

## 2018-09-24 NOTE — Telephone Encounter (Signed)
LPN called patient on 09-24-18. Pt states his bp about a week ago was 152/90. Pt states he still has some lisinopril. LPN explained to patient he is recommended to restart lisinopril and to call the office sometime next week to let us know how his blood pressure is doing. Pt verbalized understanding and states he has enough lisinopril, so does not need new rx sent.

## 2018-09-24 NOTE — Telephone Encounter (Signed)
Message  Received: 4 days ago  Message Contents  Soyla Dryer, PA-C  [place], LPN        Please call Racer Blakeney - dob [phone]- and tell him that if his bp is running 150s on top that he should restart the lisinopril that he stopped recently.  He can call office next week and let us know how his bp is looking.

## 2018-09-27 ENCOUNTER — Ambulatory Visit: Payer: Self-pay | Admitting: Physician Assistant

## 2018-10-31 ENCOUNTER — Other Ambulatory Visit: Payer: Self-pay | Admitting: Physician Assistant

## 2018-11-08 ENCOUNTER — Ambulatory Visit: Payer: Self-pay | Admitting: Physician Assistant

## 2018-11-08 ENCOUNTER — Encounter: Payer: Self-pay | Admitting: Physician Assistant

## 2018-11-08 DIAGNOSIS — K219 Gastro-esophageal reflux disease without esophagitis: Secondary | ICD-10-CM

## 2018-11-08 DIAGNOSIS — E785 Hyperlipidemia, unspecified: Secondary | ICD-10-CM

## 2018-11-08 DIAGNOSIS — I10 Essential (primary) hypertension: Secondary | ICD-10-CM

## 2018-11-08 DIAGNOSIS — E1169 Type 2 diabetes mellitus with other specified complication: Secondary | ICD-10-CM

## 2018-11-08 NOTE — Progress Notes (Signed)
There were no vitals taken for this visit.   Subjective:    Patient ID: [name] [name], male    DOB: 1961/02/15, 58 y.o.   MRN: [id number]  HPI: [name] [name] is a 58 y.o. male presenting on 11/08/2018 for No chief complaint on file.   HPI   This is a telemedicine appointment due to coronavirus pandemic.  It is via Telephone as pt does not have a smartphone with video capabilities.  I connected with  [name] [name] on 11/08/18 by a video enabled telemedicine application and verified that I am speaking with the correct person using two identifiers.   I discussed the limitations of evaluation and management by telemedicine. The patient expressed understanding and agreed to proceed.  Pt is at home.  Provider is at office/clinic.    Pt says his toe is mostly well-  It has "just a bit of callus that is working it's way off.".  He was released by the wound clinic that was managing the toe which had osteomyelitis  Pt says he got approved for disability-  He says it was due arthritis in his knees and neuropathy.    He has been checking his bp at home.  He says it is Running 130/85, 133/75  133/86- mostly in the 130s  He has been checking his blood sugars which have been running 140- something most of the time.  Only 1 time less than 100 and it was 98.    He has no complaints and says he is doing well.  He says he hasn't been wearing a mask when he goes out but he has one.    Relevant past medical, surgical, family and social history reviewed and updated as indicated. Interim medical history since our last visit reviewed. Allergies and medications reviewed and updated.   Current Outpatient Medications:  .  acetaminophen (TYLENOL) 500 MG tablet, Take 500 mg by mouth every 6 (six) hours as needed (arthritis). , Disp: , Rfl:  .  atorvastatin (LIPITOR) 20 MG tablet, TAKE 1 Tablet BY MOUTH ONCE DAILY, Disp: 90 tablet, Rfl: 1 .  Calcium Carbonate-Simethicone (ALKA-SELTZER HEARTBURN + GAS)  750-80 MG CHEW, Chew 1 tablet by mouth daily as needed., Disp: , Rfl:  .  famotidine (PEPCID) 20 MG tablet, Take 1 tablet (20 mg total) by mouth 2 (two) times daily as needed for heartburn or indigestion., Disp: 60 tablet, Rfl: 3 .  JANUVIA 100 MG tablet, TAKE 1 Tablet BY MOUTH ONCE DAILY, Disp: 90 tablet, Rfl: 1 .  lisinopril (ZESTRIL) 10 MG tablet, TAKE 1 Tablet BY MOUTH ONCE DAILY, Disp: 90 tablet, Rfl: 0 .  metFORMIN (GLUCOPHAGE) 1000 MG tablet, TAKE 1 Tablet  BY MOUTH TWICE DAILY WITH MEALS, Disp: 180 tablet, Rfl: 1 .  Omega-3 Fatty Acids (FISH OIL) 1200 MG CAPS, Take 1 capsule (1,200 mg total) by mouth 2 (two) times daily., Disp: , Rfl:     Review of Systems  Per HPI unless specifically indicated above     Objective:    There were no vitals taken for this visit.  Wt Readings from Last 3 Encounters:  08/15/18 122 lb (55.3 kg)  07/19/18 132 lb (59.9 kg)  07/12/18 133 lb (60.3 kg)    Physical Exam Pulmonary:     Effort: Pulmonary effort is normal. No respiratory distress.  Neurological:     Mental Status: He is alert and oriented to person, place, and time.  Psychiatric:        Attention and Perception:  Attention normal.        Mood and Affect: Affect is flat.        Speech: Speech normal.        Behavior: Behavior is cooperative.        Cognition and Memory: Cognition normal.     Comments: Pt is his baseline flat affect         Assessment & Plan:    Encounter Diagnoses  Name Primary?  . Type 2 diabetes mellitus with other specified complication, unspecified whether long term insulin use ([place]) Yes  . Hyperlipidemia, unspecified hyperlipidemia type   . Essential hypertension   . Gastroesophageal reflux disease, esophagitis presence not specified     -will update labs will call with results  -Pt to continue current medications -pt encouraged to wear a mask every time he goes to the store to reduce changes of contracting CV19 -Pt to follow up in 3 months.  He will  contact office sooner prn

## 2018-11-20 ENCOUNTER — Other Ambulatory Visit: Payer: Self-pay

## 2018-11-20 ENCOUNTER — Other Ambulatory Visit (HOSPITAL_COMMUNITY)
Admission: RE | Admit: 2018-11-20 | Discharge: 2018-11-20 | Disposition: A | Discharge status: Home / Self Care | Payer: Self-pay | Source: Ambulatory Visit | Attending: Physician Assistant | Admitting: Physician Assistant

## 2018-11-20 DIAGNOSIS — E785 Hyperlipidemia, unspecified: Secondary | ICD-10-CM | POA: Insufficient documentation

## 2018-11-20 DIAGNOSIS — I10 Essential (primary) hypertension: Secondary | ICD-10-CM | POA: Insufficient documentation

## 2018-11-20 DIAGNOSIS — E1169 Type 2 diabetes mellitus with other specified complication: Secondary | ICD-10-CM | POA: Insufficient documentation

## 2018-11-20 LAB — COMPREHENSIVE METABOLIC PANEL
ALT: 17 U/L (ref 0–44)
AST: 16 U/L (ref 15–41)
Albumin: 4 g/dL (ref 3.5–5.0)
Alkaline Phosphatase: 74 U/L (ref 38–126)
Anion gap: 9 (ref 5–15)
BUN: 18 mg/dL (ref 6–20)
CO2: 26 mmol/L (ref 22–32)
Calcium: 9.5 mg/dL (ref 8.9–10.3)
Chloride: 105 mmol/L (ref 98–111)
Creatinine, Ser: 0.89 mg/dL (ref 0.61–1.24)
GFR calc Af Amer: >60 mL/min (ref >60)
GFR calc non Af Amer: >60 mL/min (ref >60)
Glucose, Bld: 142 mg/dL — ABNORMAL HIGH (ref 70–99)
Potassium: 4.8 mmol/L (ref 3.5–5.1)
Sodium: 140 mmol/L (ref 135–145)
Total Bilirubin: 0.7 mg/dL (ref 0.3–1.2)
Total Protein: 7.1 g/dL (ref 6.5–8.1)

## 2018-11-20 LAB — HEMOGLOBIN A1C
Hgb A1c MFr Bld: 6.3 % — ABNORMAL HIGH (ref 4.8–5.6)
Mean Plasma Glucose: 134.11 mg/dL

## 2018-11-20 LAB — LIPID PANEL
Cholesterol: 162 mg/dL (ref 0–200)
HDL: 54 mg/dL (ref >40)
LDL Cholesterol: 74 mg/dL (ref 0–99)
Total CHOL/HDL Ratio: 3 RATIO
Triglycerides: 170 mg/dL — ABNORMAL HIGH (ref <150)
VLDL: 34 mg/dL (ref 0–40)

## 2018-12-26 ENCOUNTER — Other Ambulatory Visit: Payer: Self-pay | Admitting: Physician Assistant

## 2018-12-26 DIAGNOSIS — E785 Hyperlipidemia, unspecified: Secondary | ICD-10-CM

## 2018-12-26 DIAGNOSIS — I10 Essential (primary) hypertension: Secondary | ICD-10-CM

## 2018-12-26 DIAGNOSIS — E1169 Type 2 diabetes mellitus with other specified complication: Secondary | ICD-10-CM

## 2018-12-26 DIAGNOSIS — Z125 Encounter for screening for malignant neoplasm of prostate: Secondary | ICD-10-CM

## 2019-02-04 ENCOUNTER — Other Ambulatory Visit: Payer: Self-pay | Admitting: Physician Assistant

## 2019-02-11 ENCOUNTER — Ambulatory Visit: Payer: Self-pay | Admitting: Physician Assistant

## 2019-02-19 ENCOUNTER — Other Ambulatory Visit: Payer: Self-pay

## 2019-02-19 ENCOUNTER — Other Ambulatory Visit (HOSPITAL_COMMUNITY)
Admission: RE | Admit: 2019-02-19 | Discharge: 2019-02-19 | Disposition: A | Discharge status: Home / Self Care | Payer: Self-pay | Source: Ambulatory Visit | Attending: Physician Assistant | Admitting: Physician Assistant

## 2019-02-19 DIAGNOSIS — E1169 Type 2 diabetes mellitus with other specified complication: Secondary | ICD-10-CM | POA: Insufficient documentation

## 2019-02-19 DIAGNOSIS — I10 Essential (primary) hypertension: Secondary | ICD-10-CM | POA: Insufficient documentation

## 2019-02-19 DIAGNOSIS — Z125 Encounter for screening for malignant neoplasm of prostate: Secondary | ICD-10-CM | POA: Insufficient documentation

## 2019-02-19 DIAGNOSIS — E785 Hyperlipidemia, unspecified: Secondary | ICD-10-CM | POA: Insufficient documentation

## 2019-02-19 LAB — COMPREHENSIVE METABOLIC PANEL
ALT: 15 U/L (ref 0–44)
AST: 15 U/L (ref 15–41)
Albumin: 4.1 g/dL (ref 3.5–5.0)
Alkaline Phosphatase: 69 U/L (ref 38–126)
Anion gap: 8 (ref 5–15)
BUN: 16 mg/dL (ref 6–20)
CO2: 25 mmol/L (ref 22–32)
Calcium: 9.8 mg/dL (ref 8.9–10.3)
Chloride: 106 mmol/L (ref 98–111)
Creatinine, Ser: 0.97 mg/dL (ref 0.61–1.24)
GFR calc Af Amer: >60 mL/min (ref >60)
GFR calc non Af Amer: >60 mL/min (ref >60)
Glucose, Bld: 139 mg/dL — ABNORMAL HIGH (ref 70–99)
Potassium: 4.9 mmol/L (ref 3.5–5.1)
Sodium: 139 mmol/L (ref 135–145)
Total Bilirubin: 0.8 mg/dL (ref 0.3–1.2)
Total Protein: 7.4 g/dL (ref 6.5–8.1)

## 2019-02-19 LAB — LIPID PANEL
Cholesterol: 141 mg/dL (ref 0–200)
HDL: 42 mg/dL (ref >40)
LDL Cholesterol: 54 mg/dL (ref 0–99)
Total CHOL/HDL Ratio: 3.4 RATIO
Triglycerides: 223 mg/dL — ABNORMAL HIGH (ref <150)
VLDL: 45 mg/dL — ABNORMAL HIGH (ref 0–40)

## 2019-02-19 LAB — PSA: Prostatic Specific Antigen: 1.2 ng/mL (ref 0.00–4.00)

## 2019-02-20 ENCOUNTER — Encounter: Payer: Self-pay | Admitting: Physician Assistant

## 2019-02-20 ENCOUNTER — Ambulatory Visit: Payer: Self-pay | Admitting: Physician Assistant

## 2019-02-20 DIAGNOSIS — E1169 Type 2 diabetes mellitus with other specified complication: Secondary | ICD-10-CM

## 2019-02-20 DIAGNOSIS — E785 Hyperlipidemia, unspecified: Secondary | ICD-10-CM

## 2019-02-20 DIAGNOSIS — I10 Essential (primary) hypertension: Secondary | ICD-10-CM

## 2019-02-20 DIAGNOSIS — E1142 Type 2 diabetes mellitus with diabetic polyneuropathy: Secondary | ICD-10-CM

## 2019-02-20 LAB — HEMOGLOBIN A1C
Hgb A1c MFr Bld: 6.7 % — ABNORMAL HIGH (ref 4.8–5.6)
Mean Plasma Glucose: 146 mg/dL

## 2019-02-20 MED ORDER — GABAPENTIN 100 MG PO CAPS: 100.0000 mg | ORAL_CAPSULE | Freq: Two times a day (BID) | ORAL | 3 refills | Status: DC

## 2019-02-20 NOTE — Progress Notes (Signed)
There were no vitals taken for this visit.   Subjective:    Patient ID: [name] [name], male    DOB: 10/31/1960, 58 y.o.   MRN: [id number]  HPI: [name] [name] is a 58 y.o. male presenting on 02/20/2019 for Hypertension (pt states he had a physical for disibility around March 2020 and was told to stop taking taking lisinopril as it was making him pt states he hasn't taken it since and only takes it when his bp is high. pt states he checked his bp "a few days ago" 163/93. pt states on another day his bp was 167/89) and Diabetes   HPI   This is a telemedicine appointment due to coronavirus pandemic.  It is via telephone as pt does not have a video enabled device.    I connected with  [name] [name] on 02/20/19 by a video enabled telemedicine application and verified that I am speaking with the correct person using two identifiers.   I discussed the limitations of evaluation and management by telemedicine. The patient expressed understanding and agreed to proceed.  Pt is at home.  provider is at office.    He says he still got callus on toe.  He says he got bill for wound dr in [place].  He says it paid the office but not the doctor.     Pt c/o numbness and tingling in feet at times.       Relevant past medical, surgical, family and social history reviewed and updated as indicated. Interim medical history since our last visit reviewed. Allergies and medications reviewed and updated.   Current Outpatient Medications:  .  acetaminophen (TYLENOL) 500 MG tablet, Take 500 mg by mouth every 6 (six) hours as needed (arthritis). , Disp: , Rfl:  .  atorvastatin (LIPITOR) 20 MG tablet, TAKE 1 Tablet BY MOUTH ONCE DAILY, Disp: 90 tablet, Rfl: 1 .  Calcium Carbonate-Simethicone (ALKA-SELTZER HEARTBURN + GAS) 750-80 MG CHEW, Chew 1 tablet by mouth daily as needed., Disp: , Rfl:  .  famotidine (PEPCID) 20 MG tablet, Take 1 tablet (20 mg total) by mouth 2 (two) times daily as needed for heartburn  or indigestion., Disp: 60 tablet, Rfl: 3 .  JANUVIA 100 MG tablet, TAKE 1 Tablet BY MOUTH ONCE DAILY, Disp: 90 tablet, Rfl: 1 .  metFORMIN (GLUCOPHAGE) 1000 MG tablet, TAKE 1 Tablet  BY MOUTH TWICE DAILY WITH MEALS, Disp: 180 tablet, Rfl: 1 .  lisinopril (ZESTRIL) 10 MG tablet, TAKE 1 Tablet BY MOUTH ONCE DAILY (Patient not taking: Reported on 02/20/2019), Disp: 90 tablet, Rfl: 0 .  Omega-3 Fatty Acids (FISH OIL) 1200 MG CAPS, Take 1 capsule (1,200 mg total) by mouth 2 (two) times daily. (Patient not taking: Reported on 02/20/2019), Disp: , Rfl:      Review of Systems  Per HPI unless specifically indicated above     Objective:    There were no vitals taken for this visit.  Wt Readings from Last 3 Encounters:  08/15/18 122 lb (55.3 kg)  07/19/18 132 lb (59.9 kg)  07/12/18 133 lb (60.3 kg)    Physical Exam Pulmonary:     Effort: No respiratory distress.  Neurological:     Mental Status: He is alert and oriented to person, place, and time.  Psychiatric:        Attention and Perception: Attention normal.        Speech: Speech normal.        Behavior: Behavior is cooperative.  Results for orders placed or performed during the hospital encounter of 02/19/19  PSA  Result Value Ref Range   Prostatic Specific Antigen 1.20 0.00 - 4.00 ng/mL  Comprehensive metabolic panel  Result Value Ref Range   Sodium 139 135 - 145 mmol/L   Potassium 4.9 3.5 - 5.1 mmol/L   Chloride 106 98 - 111 mmol/L   CO2 25 22 - 32 mmol/L   Glucose, Bld 139 (H) 70 - 99 mg/dL   BUN 16 6 - 20 mg/dL   Creatinine, Ser 0.97 0.61 - 1.24 mg/dL   Calcium 9.8 8.9 - 10.3 mg/dL   Total Protein 7.4 6.5 - 8.1 g/dL   Albumin 4.1 3.5 - 5.0 g/dL   AST 15 15 - 41 U/L   ALT 15 0 - 44 U/L   Alkaline Phosphatase 69 38 - 126 U/L   Total Bilirubin 0.8 0.3 - 1.2 mg/dL   GFR calc non Af Amer >60 >60 mL/min   GFR calc Af Amer >60 >60 mL/min   Anion gap 8 5 - 15  Lipid panel  Result Value Ref Range   Cholesterol 141 0 -  200 mg/dL   Triglycerides 223 (H) <150 mg/dL   HDL 42 >40 mg/dL   Total CHOL/HDL Ratio 3.4 RATIO   VLDL 45 (H) 0 - 40 mg/dL   LDL Cholesterol 54 0 - 99 mg/dL  Hemoglobin A1c  Result Value Ref Range   Hgb A1c MFr Bld 6.7 (H) 4.8 - 5.6 %   Mean Plasma Glucose 146 mg/dL      Assessment & Plan:    Encounter Diagnoses  Name Primary?  . Type 2 diabetes mellitus with other specified complication, unspecified whether long term insulin use ([place]) Yes  . Essential hypertension   . Hyperlipidemia, unspecified hyperlipidemia type   . Diabetic polyneuropathy associated with type 2 diabetes mellitus ([place])        -reviewed labs with pt  -Gave pt number for financial counselor for him to call to discuss [facility] [place] and his [facility] financial assistance  -Pt counseled once again to get back on his lisinopril for his bp.   -Pt to get back on fish oil.  He can just try 1 daily to see if he tolerates that.   -Pt to call wound center back about hs toe  -pt to follow up F/u 3 months.  He is to contact office sooner prn

## 2019-05-01 ENCOUNTER — Other Ambulatory Visit: Payer: Self-pay | Admitting: Physician Assistant

## 2019-05-14 ENCOUNTER — Other Ambulatory Visit: Payer: Self-pay | Admitting: Physician Assistant

## 2019-05-14 DIAGNOSIS — I10 Essential (primary) hypertension: Secondary | ICD-10-CM

## 2019-05-14 DIAGNOSIS — E785 Hyperlipidemia, unspecified: Secondary | ICD-10-CM

## 2019-05-14 DIAGNOSIS — E1169 Type 2 diabetes mellitus with other specified complication: Secondary | ICD-10-CM

## 2019-05-22 ENCOUNTER — Encounter: Payer: Self-pay | Admitting: Physician Assistant

## 2019-05-22 ENCOUNTER — Ambulatory Visit: Payer: Self-pay | Admitting: Physician Assistant

## 2019-05-22 VITALS — BP 151/81

## 2019-05-22 DIAGNOSIS — E785 Hyperlipidemia, unspecified: Secondary | ICD-10-CM

## 2019-05-22 DIAGNOSIS — E1169 Type 2 diabetes mellitus with other specified complication: Secondary | ICD-10-CM

## 2019-05-22 DIAGNOSIS — E1142 Type 2 diabetes mellitus with diabetic polyneuropathy: Secondary | ICD-10-CM

## 2019-05-22 DIAGNOSIS — I10 Essential (primary) hypertension: Secondary | ICD-10-CM

## 2019-05-22 NOTE — Progress Notes (Signed)
BP (!) 151/81    Subjective:    Patient ID: [name] [name], male    DOB: 05/02/1961, 58 y.o.   MRN: [id number]  HPI: [name] [name] is a 58 y.o. male presenting on 05/22/2019 for No chief complaint on file.   HPI   This is a telemedicine appointment due to coronavirus pandemic.  It is via telephone as patient does not have a video enabled device.  I connected with  [name] [name] on 05/22/19 by a video enabled telemedicine application and verified that I am speaking with the correct person using two identifiers.   I discussed the limitations of evaluation and management by telemedicine. The patient expressed understanding and agreed to proceed.    Pt is a 58yo M with DM, htn dyslipidemia.  Pt did not get his labs drawn prior to appointment as requested.  He says he doesn't want to get labwork because he still owes on the last labs he had done and he says he only gets 55% of the [facility] discount   Pt has been monitoring his BP has had no low bps and he is not having any dizzineyss  He says he feels well today and has no complaints.     Relevant past medical, surgical, family and social history reviewed and updated as indicated. Interim medical history since our last visit reviewed. Allergies and medications reviewed and updated.   Current Outpatient Medications:  .  acetaminophen (TYLENOL) 500 MG tablet, Take 500 mg by mouth every 6 (six) hours as needed (arthritis). , Disp: , Rfl:  .  atorvastatin (LIPITOR) 20 MG tablet, TAKE 1 Tablet BY MOUTH ONCE DAILY, Disp: 90 tablet, Rfl: 1 .  Calcium Carbonate-Simethicone (ALKA-SELTZER HEARTBURN + GAS) 750-80 MG CHEW, Chew 1 tablet by mouth daily as needed., Disp: , Rfl:  .  famotidine (PEPCID) 20 MG tablet, Take 1 tablet (20 mg total) by mouth 2 (two) times daily as needed for heartburn or indigestion., Disp: 60 tablet, Rfl: 3 .  gabapentin (NEURONTIN) 100 MG capsule, Take 1 capsule (100 mg total) by mouth 2 (two) times daily., Disp: 60  capsule, Rfl: 3 .  JANUVIA 100 MG tablet, TAKE 1 Tablet BY MOUTH ONCE DAILY, Disp: 90 tablet, Rfl: 1 .  lisinopril (ZESTRIL) 10 MG tablet, TAKE 1 Tablet BY MOUTH ONCE DAILY, Disp: 90 tablet, Rfl: 0 .  metFORMIN (GLUCOPHAGE) 1000 MG tablet, TAKE 1 Tablet  BY MOUTH TWICE DAILY WITH MEALS, Disp: 180 tablet, Rfl: 1 .  Omega-3 Fatty Acids (FISH OIL) 1200 MG CAPS, Take 1 capsule (1,200 mg total) by mouth 2 (two) times daily., Disp: , Rfl:     Review of Systems  Per HPI unless specifically indicated above     Objective:    BP (!) 151/81   Wt Readings from Last 3 Encounters:  08/15/18 122 lb (55.3 kg)  07/19/18 132 lb (59.9 kg)  07/12/18 133 lb (60.3 kg)    Physical Exam Pulmonary:     Effort: No respiratory distress.  Neurological:     Mental Status: He is alert and oriented to person, place, and time.  Psychiatric:        Attention and Perception: Attention normal.        Mood and Affect: Affect is angry.        Speech: Speech normal.           Assessment & Plan:    Encounter Diagnoses  Name Primary?  . Essential hypertension Yes  . Type 2  diabetes mellitus with other specified complication, unspecified whether long term insulin use ([name])   . Hyperlipidemia, unspecified hyperlipidemia type   . Diabetic polyneuropathy associated with type 2 diabetes mellitus ([name])     -Discussed that labs 3 months ago were pretty good so labs at this time will be deferred.  Discussed with pt that we will need labs at that time to ensure safety and effective treatment -will Increase lisinopril to 20mg  and all other meds are to be contrinued as is.  He says he does not want an rx for 20mg  sent today because he just got a bottle with 3 month supply of the 10mg  and he wants to use that up by taking 2 tabs daily to get the 20mg .  This is acceptable but pt is encourage to no fill the rx bu to call the office when he needs more and he states understanding -pt to follow up 3 months.  He is to  contact office sooner for any problems

## 2019-06-24 ENCOUNTER — Other Ambulatory Visit: Payer: Self-pay | Admitting: Physician Assistant

## 2019-07-01 ENCOUNTER — Ambulatory Visit: Payer: Self-pay | Admitting: Physician Assistant

## 2019-07-01 ENCOUNTER — Encounter: Payer: Self-pay | Admitting: Physician Assistant

## 2019-07-01 VITALS — BP 175/110

## 2019-07-01 DIAGNOSIS — E785 Hyperlipidemia, unspecified: Secondary | ICD-10-CM

## 2019-07-01 DIAGNOSIS — R197 Diarrhea, unspecified: Secondary | ICD-10-CM

## 2019-07-01 DIAGNOSIS — I10 Essential (primary) hypertension: Secondary | ICD-10-CM

## 2019-07-01 DIAGNOSIS — E1169 Type 2 diabetes mellitus with other specified complication: Secondary | ICD-10-CM

## 2019-07-01 MED ORDER — METOPROLOL TARTRATE 50 MG PO TABS: 50.0000 mg | ORAL_TABLET | Freq: Two times a day (BID) | ORAL | 1 refills | Status: DC

## 2019-07-01 MED ORDER — LISINOPRIL 20 MG PO TABS: 20.0000 mg | ORAL_TABLET | Freq: Every day | ORAL | 1 refills | Status: DC

## 2019-07-01 NOTE — Progress Notes (Signed)
BP (!) 175/110    Subjective:    Patient ID: [name], male    DOB: August 05, 1960, 59 y.o.   MRN: [id number]  HPI: [name] [name] is a 59 y.o. male presenting on 07/01/2019 for Diarrhea   HPI  This is a telemedicine appointment due to coronavirus pandemic.  It is via Telephone as pt does not have a video enabled device.    I connected with  [name] [name] on 07/01/19 by a video enabled telemedicine application and verified that I am speaking with the correct person using two identifiers.   I discussed the limitations of evaluation and management by telemedicine. The patient expressed understanding and agreed to proceed.  Pt is at home.  Provider is at office.     Pt called for appointment due to he says he has been Sickly overnight.  He says he has been running to bathroom this morning with diarrhea.    He says he Didn't have it yesterday.   He says it comes and goes.  Sometimes he throws up.   He says it's been going for 3 weeks .  No blood in diarrhea.   He says he doesn't have any abdominal pain except while he is having diarrhea episode.  As above, history is somewhat inconsistent.    Pt did not get Labs not done in december due to he says he has bills owed to hospital.  He said he only gets 55% discount.   Pt taking 2 tabs of lisinopril 10mg  after last appt when increased due to elevated bp     Relevant past medical, surgical, family and social history reviewed and updated as indicated. Interim medical history since our last visit reviewed. Allergies and medications reviewed and updated.   Current Outpatient Medications:  .  acetaminophen (TYLENOL) 500 MG tablet, Take 500 mg by mouth every 6 (six) hours as needed (arthritis). , Disp: , Rfl:  .  atorvastatin (LIPITOR) 20 MG tablet, TAKE 1 Tablet BY MOUTH ONCE DAILY, Disp: 90 tablet, Rfl: 1 .  Calcium Carbonate-Simethicone (ALKA-SELTZER HEARTBURN + GAS) 750-80 MG CHEW, Chew 1 tablet by mouth daily as needed., Disp: , Rfl:   .  famotidine (PEPCID) 20 MG tablet, Take 1 tablet (20 mg total) by mouth 2 (two) times daily as needed for heartburn or indigestion., Disp: 60 tablet, Rfl: 3 .  gabapentin (NEURONTIN) 100 MG capsule, Take 1 capsule by mouth twice daily, Disp: 60 capsule, Rfl: 0 .  JANUVIA 100 MG tablet, TAKE 1 Tablet BY MOUTH ONCE DAILY, Disp: 90 tablet, Rfl: 1 .  lisinopril (ZESTRIL) 10 MG tablet, TAKE 1 Tablet BY MOUTH ONCE DAILY, Disp: 90 tablet, Rfl: 0 .  metFORMIN (GLUCOPHAGE) 1000 MG tablet, TAKE 1 Tablet  BY MOUTH TWICE DAILY WITH MEALS, Disp: 180 tablet, Rfl: 1 .  Omega-3 Fatty Acids (FISH OIL) 1200 MG CAPS, Take 1 capsule (1,200 mg total) by mouth 2 (two) times daily., Disp: , Rfl:      Review of Systems  Per HPI unless specifically indicated above     Objective:    BP (!) 175/110   Wt Readings from Last 3 Encounters:  08/15/18 122 lb (55.3 kg)  07/19/18 132 lb (59.9 kg)  07/12/18 133 lb (60.3 kg)    Physical Exam Pulmonary:     Effort: Pulmonary effort is normal. No respiratory distress.  Neurological:     Mental Status: He is alert and oriented to person, place, and time.  Psychiatric:  Attention and Perception: Attention normal.        Speech: Speech normal.        Behavior: Behavior is cooperative.           Assessment & Plan:   Encounter Diagnoses  Name Primary?  . Essential hypertension Yes  . Type 2 diabetes mellitus with other specified complication, unspecified whether long term insulin use ([place])   . Hyperlipidemia, unspecified hyperlipidemia type   . Diarrhea, unspecified type      -pt to Stop metformin to see if this is causing his diarrhea.  He will still be on the [place] for DM.   -he is to Start metoprolol today and continue his lisinopril -pt to follow up in office in 3 weeks.  He is to notify office sooner if bp stays that high or if he worsens.

## 2019-07-15 ENCOUNTER — Telehealth: Payer: Self-pay | Admitting: Student

## 2019-07-16 NOTE — Telephone Encounter (Signed)
LPN called pt again this morning 07-16-19 and spoke with pt. Pt states he FBS have been between 176-180. Pt states he has been taking [place] and has taken metformin once when his FBS was 214. Pt states he has not felt bad.  Pt has f/u appt on 07-22-19 pt is to discuss with PA at that time. Pt verbalized understanding.

## 2019-07-16 NOTE — Telephone Encounter (Signed)
PA suggests that pt can be scheduled for a sooner date for this week like tomorrow wed. 07-17-19 or Thursday 07-18-19 or pt can keep appt as is on Mon. 07-22-19.  LPN called pt to offer appt for a sooner date than 07-21-18. LPN unable to leave vm as vm is not set up.

## 2019-07-22 ENCOUNTER — Other Ambulatory Visit: Payer: Self-pay | Admitting: Physician Assistant

## 2019-07-22 ENCOUNTER — Other Ambulatory Visit: Payer: Self-pay

## 2019-07-22 ENCOUNTER — Encounter: Payer: Self-pay | Admitting: Physician Assistant

## 2019-07-22 ENCOUNTER — Ambulatory Visit: Payer: Self-pay | Admitting: Physician Assistant

## 2019-07-22 VITALS — BP 110/80 | HR 51 | Temp 98.6°F | Wt 156.1 lb

## 2019-07-22 DIAGNOSIS — E1169 Type 2 diabetes mellitus with other specified complication: Secondary | ICD-10-CM

## 2019-07-22 DIAGNOSIS — E1142 Type 2 diabetes mellitus with diabetic polyneuropathy: Secondary | ICD-10-CM

## 2019-07-22 DIAGNOSIS — I10 Essential (primary) hypertension: Secondary | ICD-10-CM

## 2019-07-22 DIAGNOSIS — S91109S Unspecified open wound of unspecified toe(s) without damage to nail, sequela: Secondary | ICD-10-CM

## 2019-07-22 DIAGNOSIS — L84 Corns and callosities: Secondary | ICD-10-CM

## 2019-07-22 DIAGNOSIS — E785 Hyperlipidemia, unspecified: Secondary | ICD-10-CM

## 2019-07-22 MED ORDER — SITAGLIPTIN PHOSPHATE 100 MG PO TABS: 100.0000 mg | ORAL_TABLET | Freq: Every day | ORAL | 1 refills | Status: DC

## 2019-07-22 MED ORDER — GABAPENTIN 100 MG PO CAPS: 100.0000 mg | ORAL_CAPSULE | Freq: Two times a day (BID) | ORAL | 0 refills | Status: DC

## 2019-07-22 MED ORDER — GLIPIZIDE 5 MG PO TABS: 5.0000 mg | ORAL_TABLET | Freq: Two times a day (BID) | ORAL | 1 refills | Status: DC

## 2019-07-22 MED ORDER — ATORVASTATIN CALCIUM 20 MG PO TABS: 20.0000 mg | ORAL_TABLET | Freq: Every day | ORAL | 1 refills | Status: DC

## 2019-07-22 MED ORDER — METOPROLOL TARTRATE 50 MG PO TABS: 50.0000 mg | ORAL_TABLET | Freq: Two times a day (BID) | ORAL | 1 refills | Status: DC

## 2019-07-22 MED ORDER — LISINOPRIL 10 MG PO TABS: 10.0000 mg | ORAL_TABLET | Freq: Every day | ORAL | 1 refills | Status: DC

## 2019-07-22 NOTE — Progress Notes (Signed)
BP 110/80   Pulse (!) 51   Temp 98.6 F (37 C)   Wt 156 lb 1.6 oz (70.8 kg)   SpO2 96%   BMI 24.09 kg/m    Subjective:    Patient ID: [name] [name], male    DOB: 04-16-1961, 59 y.o.   MRN: [id number]  HPI: [name] [name] is a 59 y.o. male presenting on 07/22/2019 for Hypertension and Hyperglycemia (FBS 228 this morning)   HPI   Pt had negative covid 19 screening questionnaire     Pt is 58yoM with DM, htn, dyslipidemia and neuropathy who presents today for routine follow up.  Pt has CAFA 55% (financial assistance)  Pt was not fasting this am when he had bs 228.  He says it was right after he ate a crumb cake.  Metformin was discontinued 1 month ago due to diarrhea.  He continues on his [place].    PT DIDN'T GET LABS DRAWN.  his Last a1c was 02/19/19- it was 6.7.  Pt is still having his R great toe wound.  There are pictures going back to 01/01/2018 that were taken at his first appointment in this office.  He had already been receiving care for the toe prior to coming to this office.   He gave himself the wound using some type of rotary callus remover.  Pt is generally grouchy and unhappy today but has no specific complaints.       Relevant past medical, surgical, family and social history reviewed and updated as indicated. Interim medical history since our last visit reviewed. Allergies and medications reviewed and updated.   Current Outpatient Medications:  .  acetaminophen (TYLENOL) 500 MG tablet, Take 500 mg by mouth every 6 (six) hours as needed (arthritis). , Disp: , Rfl:  .  atorvastatin (LIPITOR) 20 MG tablet, TAKE 1 Tablet BY MOUTH ONCE DAILY, Disp: 90 tablet, Rfl: 1 .  Calcium Carbonate-Simethicone (ALKA-SELTZER HEARTBURN + GAS) 750-80 MG CHEW, Chew 1 tablet by mouth daily as needed., Disp: , Rfl:  .  famotidine (PEPCID) 20 MG tablet, Take 1 tablet (20 mg total) by mouth 2 (two) times daily as needed for heartburn or indigestion., Disp: 60 tablet, Rfl: 3 .   gabapentin (NEURONTIN) 100 MG capsule, Take 1 capsule by mouth twice daily, Disp: 60 capsule, Rfl: 0 .  JANUVIA 100 MG tablet, TAKE 1 Tablet BY MOUTH ONCE DAILY, Disp: 90 tablet, Rfl: 1 .  lisinopril (ZESTRIL) 10 MG tablet, Take 10 mg by mouth daily., Disp: , Rfl:  .  metoprolol tartrate (LOPRESSOR) 50 MG tablet, Take 1 tablet (50 mg total) by mouth 2 (two) times daily., Disp: 60 tablet, Rfl: 1 .  Omega-3 Fatty Acids (FISH OIL) 1200 MG CAPS, Take 1 capsule (1,200 mg total) by mouth 2 (two) times daily. (Patient taking differently: Take 1 capsule by mouth daily. ), Disp: , Rfl:  .  lisinopril (ZESTRIL) 20 MG tablet, Take 1 tablet (20 mg total) by mouth daily. (Patient not taking: Reported on 07/22/2019), Disp: 90 tablet, Rfl: 1 .  metFORMIN (GLUCOPHAGE) 1000 MG tablet, TAKE 1 Tablet  BY MOUTH TWICE DAILY WITH MEALS (Patient not taking: Reported on 07/22/2019), Disp: 180 tablet, Rfl: 1     Review of Systems  Per HPI unless specifically indicated above     Objective:    BP 110/80   Pulse (!) 51   Temp 98.6 F (37 C)   Wt 156 lb 1.6 oz (70.8 kg)   SpO2 96%  BMI 24.09 kg/m   Wt Readings from Last 3 Encounters:  07/22/19 156 lb 1.6 oz (70.8 kg)  08/15/18 122 lb (55.3 kg)  07/19/18 132 lb (59.9 kg)    Physical Exam Vitals reviewed.  Constitutional:      Appearance: He is well-developed.  HENT:     Head: Normocephalic and atraumatic.  Cardiovascular:     Rate and Rhythm: Normal rate and regular rhythm.  Pulmonary:     Effort: Pulmonary effort is normal.     Breath sounds: Normal breath sounds. No wheezing.  Abdominal:     General: Bowel sounds are normal.     Palpations: Abdomen is soft.     Tenderness: There is no abdominal tenderness.  Musculoskeletal:     Cervical back: Neck supple.     Right lower leg: No edema.     Left lower leg: No edema.  Feet:     Right foot:     Skin integrity: Ulcer and callus present.     Comments: Right great toe with thick callus and open  wound.  No signs acute infection- no redness, purulence or odor Lymphadenopathy:     Cervical: No cervical adenopathy.  Skin:    General: Skin is warm and dry.  Neurological:     Mental Status: He is alert and oriented to person, place, and time.  Psychiatric:        Behavior: Behavior is cooperative.     Comments: Pt at baseline                  Assessment & Plan:    Encounter Diagnoses  Name Primary?  . Type 2 diabetes mellitus with other specified complication, unspecified whether long term insulin use ([place]) Yes  . Essential hypertension   . Hyperlipidemia, unspecified hyperlipidemia type   . Open toe wound, sequela   . Diabetic polyneuropathy associated with type 2 diabetes mellitus ([place])   . Callus of foot      Discussed with pt that he NEEDS LABS DRAWN in order to safely manage his medical conditions  Will refer to podiatry for toe.    Diabetic Foot exam updated  Pt counseled to Avoid crumb cakes.  Will add glipizide to replace the metformin that was discontinued due to diarrhea  He is to continue his current medications  He says he spoke with diane ([facility] re financial assistance)  Pt to follow up F/u  3 months.  He is to contact office sooner prn

## 2019-08-21 ENCOUNTER — Ambulatory Visit: Payer: Self-pay | Admitting: Physician Assistant

## 2019-08-22 ENCOUNTER — Other Ambulatory Visit: Payer: Self-pay | Admitting: Physician Assistant

## 2019-08-27 ENCOUNTER — Other Ambulatory Visit: Payer: Self-pay

## 2019-08-27 ENCOUNTER — Emergency Department (HOSPITAL_COMMUNITY): Payer: Self-pay

## 2019-08-27 ENCOUNTER — Emergency Department (HOSPITAL_COMMUNITY)
Admission: EM | Admit: 2019-08-27 | Discharge: 2019-08-27 | Disposition: A | Discharge status: Home / Self Care | Payer: Self-pay | Attending: Emergency Medicine | Admitting: Emergency Medicine

## 2019-08-27 ENCOUNTER — Encounter (HOSPITAL_COMMUNITY): Payer: Self-pay | Admitting: Emergency Medicine

## 2019-08-27 DIAGNOSIS — Z79899 Other long term (current) drug therapy: Secondary | ICD-10-CM | POA: Insufficient documentation

## 2019-08-27 DIAGNOSIS — Y999 Unspecified external cause status: Secondary | ICD-10-CM | POA: Insufficient documentation

## 2019-08-27 DIAGNOSIS — T185XXA Foreign body in anus and rectum, initial encounter: Secondary | ICD-10-CM | POA: Insufficient documentation

## 2019-08-27 DIAGNOSIS — Y9389 Activity, other specified: Secondary | ICD-10-CM | POA: Insufficient documentation

## 2019-08-27 DIAGNOSIS — W458XXA Other foreign body or object entering through skin, initial encounter: Secondary | ICD-10-CM | POA: Insufficient documentation

## 2019-08-27 DIAGNOSIS — E119 Type 2 diabetes mellitus without complications: Secondary | ICD-10-CM | POA: Insufficient documentation

## 2019-08-27 DIAGNOSIS — Y9289 Other specified places as the place of occurrence of the external cause: Secondary | ICD-10-CM | POA: Insufficient documentation

## 2019-08-27 NOTE — ED Provider Notes (Signed)
[facility] EMERGENCY DEPARTMENT Provider Note   CSN: [id number] Arrival date & time: 08/27/19  2353     History Chief Complaint  Patient presents with  . Foreign Body in Rectum    [name] [name] is a 59 y.o. male.  Patient presents to the emergency department with complaints of a foreign body in his rectum.  Patient reports that he stuck an object into his rectum because he was feeling constipated and thought it would help him have a bowel movement.  This occurred at 2:30 AM today.        Past Medical History:  Diagnosis Date  . Arthritis   . Diabetes mellitus without complication ([place]) 61/44/3154  . GERD (gastroesophageal reflux disease)     Patient Active Problem List   Diagnosis Date Noted  . Neuropathy 07/12/2018  . Osteomyelitis of great toe of right foot ([place]) 07/12/2018  . Essential hypertension 02/14/2018  . Uncontrolled type 2 diabetes mellitus with hyperglycemia ([place]) 01/08/2018  . Hyperlipidemia 01/08/2018    Past Surgical History:  Procedure Laterality Date  . CATARACT EXTRACTION, BILATERAL  2018  . TONSILLECTOMY         Family History  Problem Relation Age of Onset  . Hypertension Mother   . Diabetes Mother   . Cancer Father        prostate cancer  . Hypertension Maternal Grandfather   . Rheum arthritis Maternal Grandmother     Social History   Tobacco Use  . Smoking status: Never Smoker  . Smokeless tobacco: Never Used  Substance Use Topics  . Alcohol use: Never  . Drug use: Never    Home Medications Prior to Admission medications   Medication Sig Start Date End Date Taking? Authorizing Provider  acetaminophen (TYLENOL) 500 MG tablet Take 500 mg by mouth every 6 (six) hours as needed (arthritis).     [provider]  atorvastatin (LIPITOR) 20 MG tablet Take 1 tablet (20 mg total) by mouth daily. 07/22/19   Soyla Dryer, PA-C  Calcium Carbonate-Simethicone (ALKA-SELTZER HEARTBURN + GAS) 750-80 MG CHEW  Chew 1 tablet by mouth daily as needed.    [provider]  famotidine (PEPCID) 20 MG tablet Take 1 tablet (20 mg total) by mouth 2 (two) times daily as needed for heartburn or indigestion. 01/08/18   Soyla Dryer, PA-C  gabapentin (NEURONTIN) 100 MG capsule Take 1 capsule by mouth twice daily 08/22/19   Soyla Dryer, PA-C  glipiZIDE (GLUCOTROL) 5 MG tablet Take 1 tablet (5 mg total) by mouth 2 (two) times daily before a meal. 07/22/19   Soyla Dryer, PA-C  lisinopril (ZESTRIL) 10 MG tablet Take 1 tablet (10 mg total) by mouth daily. 07/22/19   Soyla Dryer, PA-C  metoprolol tartrate (LOPRESSOR) 50 MG tablet Take 1 tablet (50 mg total) by mouth 2 (two) times daily. 07/22/19   Soyla Dryer, PA-C  Omega-3 Fatty Acids (FISH OIL) 1200 MG CAPS Take 1 capsule (1,200 mg total) by mouth 2 (two) times daily. Patient taking differently: Take 1 capsule by mouth daily.  01/08/18   Soyla Dryer, PA-C  sitaGLIPtin (JANUVIA) 100 MG tablet Take 1 tablet (100 mg total) by mouth daily. 07/22/19   Soyla Dryer, PA-C    Allergies    Patient has no known allergies.  Review of Systems   Review of Systems  Gastrointestinal: Positive for constipation.  All other systems reviewed and are negative.   Physical Exam Updated Vital Signs BP (!) 199/96 (BP Location: Left Arm)  Pulse 71   Temp 98.4 F (36.9 C) (Oral)   Resp 16   SpO2 95%   Physical Exam Vitals and nursing note reviewed.  Constitutional:      General: He is not in acute distress.    Appearance: Normal appearance. He is well-developed.  HENT:     Head: Normocephalic and atraumatic.     Right Ear: Hearing normal.     Left Ear: Hearing normal.     Nose: Nose normal.  Eyes:     Conjunctiva/sclera: Conjunctivae normal.     Pupils: Pupils are equal, round, and reactive to light.  Cardiovascular:     Rate and Rhythm: Regular rhythm.     Heart sounds: S1 normal and S2 normal. No murmur. No friction rub. No gallop.     Pulmonary:     Effort: Pulmonary effort is normal. No respiratory distress.     Breath sounds: Normal breath sounds.  Chest:     Chest wall: No tenderness.  Abdominal:     General: Bowel sounds are normal.     Palpations: Abdomen is soft.     Tenderness: There is no abdominal tenderness. There is no guarding or rebound. Negative signs include Murphy's sign and McBurney's sign.     Hernia: No hernia is present.  Musculoskeletal:        General: Normal range of motion.     Cervical back: Normal range of motion and neck supple.  Skin:    General: Skin is warm and dry.     Findings: No rash.  Neurological:     Mental Status: He is alert and oriented to person, place, and time.     GCS: GCS eye subscore is 4. GCS verbal subscore is 5. GCS motor subscore is 6.     Cranial Nerves: No cranial nerve deficit.     Sensory: No sensory deficit.     Coordination: Coordination normal.  Psychiatric:        Mood and Affect: Mood is anxious.        Speech: Speech normal.        Behavior: Behavior normal.        Thought Content: Thought content normal.     ED Results / Procedures / Treatments   Labs (all labs ordered are listed, but only abnormal results are displayed) Labs Reviewed - No data to display  EKG None  Radiology DG Abdomen 1 View  Result Date: 08/27/2019 CLINICAL DATA:  Rectal foreign body EXAM: ABDOMEN - 1 VIEW COMPARISON:  None. FINDINGS: There is a low-density foreign body projecting over the rectum, measuring approximately 19 cm (measurement does not account for potential geometric magnification). Otherwise unremarkable abdomen. IMPRESSION: Large foreign body projecting within the rectum. Electronically Signed   By: Ulyses Jarred M.D.   On: 08/27/2019 04:01    Procedures .Foreign Body Removal  Date/Time: 08/27/2019 5:58 AM Performed by: Orpah Greek, MD Authorized by: Orpah Greek, MD  Consent: Verbal consent obtained. Risks and benefits: risks,  benefits and alternatives were discussed Consent given by: patient Patient understanding: patient states understanding of the procedure being performed Patient consent: the patient's understanding of the procedure matches consent given Procedure consent: procedure consent matches procedure scheduled Relevant documents: relevant documents present and verified Test results: test results available and properly labeled Site marked: the operative site was marked Imaging studies: imaging studies available Patient identity confirmed: verbally with patient Time out: Immediately prior to procedure a "time out" was called to verify  the correct patient, procedure, equipment, support staff and site/side marked as required. Body area: rectum  Sedation: Patient sedated: no  Patient cooperative: yes Complexity: simple 1 objects recovered. Objects recovered: Long hollow plastic cylinder Post-procedure assessment: foreign body removed Patient tolerance: patient tolerated the procedure well with no immediate complications Comments: Vaginal speculum was placed and positioned so foreign body was visualized approximately 6 cm into the rectum.  I did make one attempt to grab the end of the object with McGill forceps to pull it out but I could not keep the forceps on the object.  I then was able to locate the object digitally after removal of the speculum.  With a combination of the patient bearing down and advancing my finger deeper I was able to grab the object and pull it out.   (including critical care time)  Medications Ordered in ED Medications - No data to display  ED Course  I have reviewed the triage vital signs and the nursing notes.  Pertinent labs & imaging results that were available during my care of the patient were reviewed by me and considered in my medical decision making (see chart for details).    MDM Rules/Calculators/A&P                      Patient presents with rectal foreign  body.  Digital rectal exam was performed and I could feel the object.  Was unable to grab the object with McGill forceps.  Speculum was removed and I was able to just grab the end of the object digitally and remove it. Final Clinical Impression(s) / ED Diagnoses Final diagnoses:  Foreign body of rectum, initial encounter    Rx / DC Orders ED Discharge Orders    None       Mailin Coglianese, Gwenyth Allegra, MD 08/27/19 [phone]

## 2019-08-27 NOTE — ED Triage Notes (Signed)
Pt reports he was constipated at home so he decided to use a plastic object approx 8 inches long to help disimpact himself, but states the object got stuck. Reports the incident happened a couple of hours ago and he has been unable to get it out.

## 2019-08-27 NOTE — ED Notes (Signed)
Dr. Betsey Holiday made aware of patient, necessary orders to be placed by provider while patient is waiting to be seen.

## 2019-09-24 ENCOUNTER — Other Ambulatory Visit: Payer: Self-pay | Admitting: Physician Assistant

## 2019-10-10 ENCOUNTER — Other Ambulatory Visit: Payer: Self-pay | Admitting: Physician Assistant

## 2019-10-10 DIAGNOSIS — E785 Hyperlipidemia, unspecified: Secondary | ICD-10-CM

## 2019-10-10 DIAGNOSIS — I10 Essential (primary) hypertension: Secondary | ICD-10-CM

## 2019-10-10 DIAGNOSIS — E1169 Type 2 diabetes mellitus with other specified complication: Secondary | ICD-10-CM

## 2019-10-15 ENCOUNTER — Telehealth: Payer: Self-pay | Admitting: Student

## 2019-10-15 NOTE — Telephone Encounter (Signed)
Pt called 10-15-19 c/o liquidy diarrhea for past 2-3 weeks occurring every other day around midnight. Pt has taken imodium which sometimes helps. Pt d/c metformin as advised by PA at 06/2019 OV due to is causing diarrhea. Pt states he did have improvement for a while since d/c metformin up until 2-3 weeks ago when it restarted.  Pt has f/u appt on Monday, 10-21-19 in which he MUST get lab work done in order to keep appt as he is 8 months past due. Pt is aware and states he plans to go on Friday.  LPN explained to pt that PA is currently seeing patients and would have to discuss with her a treatment plan for his diarrhea and would have to call him back. Pt states he would rather call back instead of waiting for call back from LPN.  PA advised for pt to get lab work done to check electrolytes and to use OTC antidiarrheals. LPN awaiting for pt to call back.

## 2019-10-16 NOTE — Telephone Encounter (Signed)
Pt did not call the office back before the end of the day on 10-15-19. LPN called pt on 06-21-14 and phone rang for over a minute without going to vm. Closing encounter

## 2019-10-18 ENCOUNTER — Other Ambulatory Visit (HOSPITAL_COMMUNITY)
Admission: RE | Admit: 2019-10-18 | Discharge: 2019-10-18 | Disposition: A | Discharge status: Home / Self Care | Payer: Self-pay | Source: Ambulatory Visit | Attending: Physician Assistant | Admitting: Physician Assistant

## 2019-10-18 DIAGNOSIS — E1169 Type 2 diabetes mellitus with other specified complication: Secondary | ICD-10-CM | POA: Insufficient documentation

## 2019-10-18 DIAGNOSIS — E785 Hyperlipidemia, unspecified: Secondary | ICD-10-CM | POA: Insufficient documentation

## 2019-10-18 DIAGNOSIS — I10 Essential (primary) hypertension: Secondary | ICD-10-CM | POA: Insufficient documentation

## 2019-10-18 LAB — HEMOGLOBIN A1C
Hgb A1c MFr Bld: 5.9 % — ABNORMAL HIGH (ref 4.8–5.6)
Mean Plasma Glucose: 122.63 mg/dL

## 2019-10-18 LAB — COMPREHENSIVE METABOLIC PANEL
ALT: 16 U/L (ref 0–44)
AST: 21 U/L (ref 15–41)
Albumin: 3.4 g/dL — ABNORMAL LOW (ref 3.5–5.0)
Alkaline Phosphatase: 88 U/L (ref 38–126)
Anion gap: 7 (ref 5–15)
BUN: 21 mg/dL — ABNORMAL HIGH (ref 6–20)
CO2: 27 mmol/L (ref 22–32)
Calcium: 8.8 mg/dL — ABNORMAL LOW (ref 8.9–10.3)
Chloride: 107 mmol/L (ref 98–111)
Creatinine, Ser: 1.23 mg/dL (ref 0.61–1.24)
GFR calc Af Amer: >60 mL/min (ref >60)
GFR calc non Af Amer: >60 mL/min (ref >60)
Glucose, Bld: 142 mg/dL — ABNORMAL HIGH (ref 70–99)
Potassium: 4.1 mmol/L (ref 3.5–5.1)
Sodium: 141 mmol/L (ref 135–145)
Total Bilirubin: 0.5 mg/dL (ref 0.3–1.2)
Total Protein: 6.3 g/dL — ABNORMAL LOW (ref 6.5–8.1)

## 2019-10-18 LAB — LIPID PANEL
Cholesterol: 164 mg/dL (ref 0–200)
HDL: 49 mg/dL (ref >40)
LDL Cholesterol: 78 mg/dL (ref 0–99)
Total CHOL/HDL Ratio: 3.3 RATIO
Triglycerides: 185 mg/dL — ABNORMAL HIGH (ref <150)
VLDL: 37 mg/dL (ref 0–40)

## 2019-10-19 LAB — MICROALBUMIN, URINE: Microalb, Ur: 3544.6 ug/mL — ABNORMAL HIGH

## 2019-10-21 ENCOUNTER — Encounter: Payer: Self-pay | Admitting: Physician Assistant

## 2019-10-21 ENCOUNTER — Ambulatory Visit: Payer: BLUE CROSS/BLUE SHIELD | Admitting: Physician Assistant

## 2019-10-21 ENCOUNTER — Other Ambulatory Visit: Payer: Self-pay

## 2019-10-21 VITALS — BP 154/80 | HR 52 | Temp 99.7°F | Wt 161.5 lb

## 2019-10-21 DIAGNOSIS — E1142 Type 2 diabetes mellitus with diabetic polyneuropathy: Secondary | ICD-10-CM

## 2019-10-21 DIAGNOSIS — E1169 Type 2 diabetes mellitus with other specified complication: Secondary | ICD-10-CM

## 2019-10-21 DIAGNOSIS — R197 Diarrhea, unspecified: Secondary | ICD-10-CM

## 2019-10-21 DIAGNOSIS — E785 Hyperlipidemia, unspecified: Secondary | ICD-10-CM

## 2019-10-21 DIAGNOSIS — E11319 Type 2 diabetes mellitus with unspecified diabetic retinopathy without macular edema: Secondary | ICD-10-CM

## 2019-10-21 DIAGNOSIS — S91109S Unspecified open wound of unspecified toe(s) without damage to nail, sequela: Secondary | ICD-10-CM

## 2019-10-21 DIAGNOSIS — I10 Essential (primary) hypertension: Secondary | ICD-10-CM

## 2019-10-21 MED ORDER — GLIPIZIDE 5 MG PO TABS
5.0000 mg | ORAL_TABLET | Freq: Every day | ORAL | 1 refills | Status: DC
Start: 2019-10-21 — End: 2020-02-04

## 2019-10-21 MED ORDER — ATORVASTATIN CALCIUM 20 MG PO TABS: 20.0000 mg | ORAL_TABLET | Freq: Every day | ORAL | 0 refills | Status: DC

## 2019-10-21 MED ORDER — LISINOPRIL 20 MG PO TABS: 20.0000 mg | ORAL_TABLET | Freq: Every day | ORAL | 0 refills | Status: DC

## 2019-10-21 MED ORDER — GABAPENTIN 100 MG PO CAPS: 100.0000 mg | ORAL_CAPSULE | Freq: Two times a day (BID) | ORAL | 0 refills | Status: DC

## 2019-10-21 MED ORDER — METOPROLOL TARTRATE 50 MG PO TABS: 50.0000 mg | ORAL_TABLET | Freq: Two times a day (BID) | ORAL | 0 refills | Status: DC

## 2019-10-21 MED ORDER — AMLODIPINE BESYLATE 5 MG PO TABS
5.0000 mg | ORAL_TABLET | Freq: Every day | ORAL | 0 refills | Status: DC
Start: 2019-10-21 — End: 2019-12-18

## 2019-10-21 MED ORDER — SITAGLIPTIN PHOSPHATE 100 MG PO TABS: 100.0000 mg | ORAL_TABLET | Freq: Every day | ORAL | 0 refills | Status: DC

## 2019-10-21 NOTE — Progress Notes (Signed)
BP (!) 154/80   Pulse (!) 52   Temp 99.7 F (37.6 C)   Wt 161 lb 8 oz (73.3 kg)   SpO2 97%   BMI 24.92 kg/m    Subjective:    Patient ID: [name] [name], male    DOB: January 15, 1961, 59 y.o.   MRN: [id number]  HPI: [name] [name] is a 59 y.o. male presenting on 10/21/2019 for Diabetes, Hyperlipidemia, Hypertension, and Foot Ulcer   HPI    Pt had a negative covid 80 screening questionnaire    Pt is 58yoM with DM, htn, dyslipidemia, sore on great toe and neuropathy who presents today for routine follow up.  Pt today for first time mentions last eye exam done noveber 2019 and says the eye doctor wanted him to see a specialist.    He is still having diarrhea.  Every 48 hours he has a night of diarrhea/every other night.   He stopped eating crumb cakes.   discussed that at last OV as those are not good things to include in his diabetic diet.   Pt was referred to podiatry for sore on foot at last OV in February and he has appt on May 20.   His wound on great toes has been present since he established care at this office on 01/01/2018 (picture included in that office note).  It has waxed and waned but never resolved.  Pt has been referred to specialists and he will go for a while and then he stops.      Relevant past medical, surgical, family and social history reviewed and updated as indicated. Interim medical history since our last visit reviewed. Allergies and medications reviewed and updated.    Current Outpatient Medications:  .  acetaminophen (TYLENOL) 500 MG tablet, Take 500 mg by mouth every 6 (six) hours as needed (arthritis). , Disp: , Rfl:  .  atorvastatin (LIPITOR) 20 MG tablet, Take 1 tablet (20 mg total) by mouth daily., Disp: 90 tablet, Rfl: 1 .  Calcium Carbonate-Simethicone (ALKA-SELTZER HEARTBURN + GAS) 750-80 MG CHEW, Chew 1 tablet by mouth daily as needed., Disp: , Rfl:  .  famotidine (PEPCID) 20 MG tablet, Take 1 tablet (20 mg total) by mouth 2 (two) times daily  as needed for heartburn or indigestion., Disp: 60 tablet, Rfl: 3 .  gabapentin (NEURONTIN) 100 MG capsule, Take 1 capsule by mouth twice daily, Disp: 60 capsule, Rfl: 0 .  glipiZIDE (GLUCOTROL) 5 MG tablet, Take 1 tablet (5 mg total) by mouth 2 (two) times daily before a meal., Disp: 180 tablet, Rfl: 1 .  lisinopril (ZESTRIL) 10 MG tablet, Take 1 tablet (10 mg total) by mouth daily., Disp: 90 tablet, Rfl: 1 .  metoprolol tartrate (LOPRESSOR) 50 MG tablet, Take 1 tablet (50 mg total) by mouth 2 (two) times daily., Disp: 180 tablet, Rfl: 1 .  Omega-3 Fatty Acids (FISH OIL) 1200 MG CAPS, Take 1 capsule (1,200 mg total) by mouth 2 (two) times daily. (Patient taking differently: Take 1 capsule by mouth at bedtime. ), Disp: , Rfl:  .  sitaGLIPtin (JANUVIA) 100 MG tablet, Take 1 tablet (100 mg total) by mouth daily., Disp: 90 tablet, Rfl: 1    Review of Systems  Per HPI unless specifically indicated above     Objective:    BP (!) 154/80   Pulse (!) 52   Temp 99.7 F (37.6 C)   Wt 161 lb 8 oz (73.3 kg)   SpO2 97%   BMI 24.92 kg/m  Wt Readings from Last 3 Encounters:  10/21/19 161 lb 8 oz (73.3 kg)  07/22/19 156 lb 1.6 oz (70.8 kg)  08/15/18 122 lb (55.3 kg)    Physical Exam Vitals reviewed.  Constitutional:      General: He is not in acute distress.    Appearance: He is well-developed. He is not toxic-appearing.  HENT:     Head: Normocephalic and atraumatic.  Cardiovascular:     Rate and Rhythm: Normal rate and regular rhythm.  Pulmonary:     Effort: Pulmonary effort is normal.     Breath sounds: Normal breath sounds. No wheezing.  Abdominal:     General: Bowel sounds are normal.     Palpations: Abdomen is soft.     Tenderness: There is no abdominal tenderness.  Musculoskeletal:     Cervical back: Neck supple.     Right lower leg: No edema.     Left lower leg: No edema.  Feet:     Comments: Right great toe wound as pictured Lymphadenopathy:     Cervical: No cervical  adenopathy.  Skin:    General: Skin is warm and dry.  Neurological:     Mental Status: He is alert and oriented to person, place, and time.  Psychiatric:        Behavior: Behavior is cooperative.     Comments: Pt at baseline         Results for orders placed or performed during the hospital encounter of 10/18/19  Microalbumin, urine  Result Value Ref Range   Microalb, Ur 3,544.6 (H) Not Estab. ug/mL  Comprehensive metabolic panel  Result Value Ref Range   Sodium 141 135 - 145 mmol/L   Potassium 4.1 3.5 - 5.1 mmol/L   Chloride 107 98 - 111 mmol/L   CO2 27 22 - 32 mmol/L   Glucose, Bld 142 (H) 70 - 99 mg/dL   BUN 21 (H) 6 - 20 mg/dL   Creatinine, Ser 1.23 0.61 - 1.24 mg/dL   Calcium 8.8 (L) 8.9 - 10.3 mg/dL   Total Protein 6.3 (L) 6.5 - 8.1 g/dL   Albumin 3.4 (L) 3.5 - 5.0 g/dL   AST 21 15 - 41 U/L   ALT 16 0 - 44 U/L   Alkaline Phosphatase 88 38 - 126 U/L   Total Bilirubin 0.5 0.3 - 1.2 mg/dL   GFR calc non Af Amer >60 >60 mL/min   GFR calc Af Amer >60 >60 mL/min   Anion gap 7 5 - 15  Hemoglobin A1c  Result Value Ref Range   Hgb A1c MFr Bld 5.9 (H) 4.8 - 5.6 %   Mean Plasma Glucose 122.63 mg/dL  Lipid panel  Result Value Ref Range   Cholesterol 164 0 - 200 mg/dL   Triglycerides 185 (H) <150 mg/dL   HDL 49 >40 mg/dL   Total CHOL/HDL Ratio 3.3 RATIO   VLDL 37 0 - 40 mg/dL   LDL Cholesterol 78 0 - 99 mg/dL      Assessment & Plan:     Encounter Diagnoses  Name Primary?  . Type 2 diabetes mellitus with other specified complication, unspecified whether long term insulin use ([place]) Yes  . Essential hypertension   . Hyperlipidemia, unspecified hyperlipidemia type   . Open toe wound, sequela   . Diabetic polyneuropathy associated with type 2 diabetes mellitus ([place])   . Diarrhea, unspecified type   . Diabetic retinopathy associated with type 2 diabetes mellitus, macular edema presence unspecified, unspecified laterality, unspecified  retinopathy severity ([place])        -reviewed labs with pt  -will Check on specialist for eyes.  There is documentataion in Epic dated 04/25/18 by my nurse stating that she had spoken with eye specialist at [facility] and was told that referral would need to come from eye doctor.  She notified [facility].  -will Cut back glipizide to qd.  Pt to Continue januvia  -Increase lisinopril to 20mg .  He says he was already taking 20 daily.   Do not want to increase metoprilol due to pulse 52  Add amlodipine  -Pt to see wound care 5/20 as scheduled  -Offered GI referral for ongoing diarrhea complaints but pt declines  -pt to follow up in 6 weeks to recheck BP.  He is to contact office sooner prn

## 2019-10-31 ENCOUNTER — Encounter: Payer: Self-pay | Admitting: Podiatry

## 2019-10-31 ENCOUNTER — Other Ambulatory Visit: Payer: Self-pay

## 2019-10-31 ENCOUNTER — Ambulatory Visit: Payer: No Typology Code available for payment source | Admitting: Podiatry

## 2019-10-31 ENCOUNTER — Ambulatory Visit (INDEPENDENT_AMBULATORY_CARE_PROVIDER_SITE_OTHER): Payer: Self-pay

## 2019-10-31 ENCOUNTER — Ambulatory Visit (INDEPENDENT_AMBULATORY_CARE_PROVIDER_SITE_OTHER): Payer: No Typology Code available for payment source | Admitting: Podiatry

## 2019-10-31 VITALS — BP 179/90 | HR 81 | Temp 97.9°F

## 2019-10-31 DIAGNOSIS — M869 Osteomyelitis, unspecified: Secondary | ICD-10-CM

## 2019-10-31 DIAGNOSIS — L97512 Non-pressure chronic ulcer of other part of right foot with fat layer exposed: Secondary | ICD-10-CM

## 2019-10-31 MED ORDER — DOXYCYCLINE HYCLATE 100 MG PO TABS
100.0000 mg | ORAL_TABLET | Freq: Two times a day (BID) | ORAL | 0 refills | Status: DC
Start: 2019-10-31 — End: 2019-12-02

## 2019-11-01 NOTE — Progress Notes (Signed)
Subjective:   Patient ID: [name] [name], male   DOB: 59 y.o.   MRN: [id number]   HPI 59 year old male presents the office today for concerns of a chronic wound to the bottom of his right big toe which is been ongoing since 2019.  It seems that he is seen other doctors that he will follow up for short amount time but the wound does not heal so he stopped going.  He states that nobody has healed the wound.  Per the records he had an MRI performed of the right foot in January 2020 which revealed osteomyelitis.  Follow-up with infectious disease and was given antibiotics but he states it made him sick so he stopped taking them and not follow-up.  He also is being seen at the wound care center as well.  He has been diagnosed with neuropathy.  He is diabetic his last A1c was 5.9.  Denies any fevers, chills, nausea, vomiting.  No calf pain, chest pain, shortness of breath.  He states that he does not want new x-rays today.   Review of Systems  All other systems reviewed and are negative.  Past Medical History:  Diagnosis Date  . Arthritis   . Diabetes mellitus without complication ([place]) 40/98/1191  . GERD (gastroesophageal reflux disease)     Past Surgical History:  Procedure Laterality Date  . CATARACT EXTRACTION, BILATERAL  2018  . TONSILLECTOMY       Current Outpatient Medications:  .  acetaminophen (TYLENOL) 500 MG tablet, Take 500 mg by mouth every 6 (six) hours as needed (arthritis). , Disp: , Rfl:  .  amLODipine (NORVASC) 5 MG tablet, Take 1 tablet (5 mg total) by mouth daily., Disp: 90 tablet, Rfl: 0 .  atorvastatin (LIPITOR) 20 MG tablet, Take 1 tablet (20 mg total) by mouth daily., Disp: 90 tablet, Rfl: 0 .  Calcium Carbonate-Simethicone (ALKA-SELTZER HEARTBURN + GAS) 750-80 MG CHEW, Chew 1 tablet by mouth daily as needed., Disp: , Rfl:  .  doxycycline (VIBRA-TABS) 100 MG tablet, Take 1 tablet (100 mg total) by mouth 2 (two) times daily., Disp: 20 tablet, Rfl: 0 .  famotidine  (PEPCID) 20 MG tablet, Take 1 tablet (20 mg total) by mouth 2 (two) times daily as needed for heartburn or indigestion., Disp: 60 tablet, Rfl: 3 .  gabapentin (NEURONTIN) 100 MG capsule, Take 1 capsule (100 mg total) by mouth 2 (two) times daily., Disp: 60 capsule, Rfl: 0 .  glipiZIDE (GLUCOTROL) 5 MG tablet, Take 1 tablet (5 mg total) by mouth daily before breakfast., Disp: 90 tablet, Rfl: 1 .  lisinopril (ZESTRIL) 20 MG tablet, Take 1 tablet (20 mg total) by mouth daily., Disp: 90 tablet, Rfl: 0 .  metoprolol tartrate (LOPRESSOR) 50 MG tablet, Take 1 tablet (50 mg total) by mouth 2 (two) times daily., Disp: 180 tablet, Rfl: 0 .  Omega-3 Fatty Acids (FISH OIL) 1200 MG CAPS, Take 1 capsule (1,200 mg total) by mouth 2 (two) times daily. (Patient taking differently: Take 1 capsule by mouth at bedtime. ), Disp: , Rfl:  .  sitaGLIPtin (JANUVIA) 100 MG tablet, Take 1 tablet (100 mg total) by mouth daily., Disp: 90 tablet, Rfl: 0  No Known Allergies        Objective:  Physical Exam  General: AAO x3, NAD  Dermatological: Ulceration on the plantar aspect the right hallux with surrounding hyperkeratotic tissue.  After debridement of the wound measures 0.5 x 0.5 x 0.1 cm and is 100% granular.  There is  no drainage or pus.  There is mild edema and erythema to the hallux but no ascending cellulitis.  There is no fluctuation or crepitation.  There is no malodor.  Vascular: Dorsalis Pedis artery and Posterior Tibial artery pedal pulses are 2/4 bilateral with immedate capillary fill time. There is no pain with calf compression, swelling, warmth, erythema.   Neruologic: Sensation decreased with Semmes Weinstein monofilament  Musculoskeletal: No gross boney pedal deformities bilateral. No pain, crepitus, or limitation noted with foot and ankle range of motion bilateral. Muscular strength 5/5 in all groups tested bilateral.     Assessment:   Chronic ulceration right hallux     Plan:  -Treatment  options discussed including all alternatives, risks, and complications -Etiology of symptoms were discussed -I had a long discussion with him in regards to the wound, infection.  I would like to get new x-rays today but he declined this.  Discussed with him he is at high risk of amputation of his toe.  It was difficult to get a history from him and I am not sure if he understood the significance of the wound. -I sharply debrided the wound today utilizing the 312 with scalpel to any complications down to healthy, granular tissue.  We will do medihoney dressing changes daily which I applied today and also gave him some.  Prescribe doxycycline.  Offloading shoe dispensed. -Monitor for any clinical signs or symptoms of infection and directed to call the office immediately should any occur or go to the ER.  Return in about 4 weeks (around 11/28/2019) for wound check .  Trula Slade DPM

## 2019-11-21 ENCOUNTER — Other Ambulatory Visit: Payer: Self-pay | Admitting: Physician Assistant

## 2019-12-02 ENCOUNTER — Encounter: Payer: Self-pay | Admitting: Physician Assistant

## 2019-12-02 ENCOUNTER — Ambulatory Visit: Payer: Self-pay | Admitting: Physician Assistant

## 2019-12-02 VITALS — BP 170/86

## 2019-12-02 DIAGNOSIS — I10 Essential (primary) hypertension: Secondary | ICD-10-CM

## 2019-12-02 DIAGNOSIS — R197 Diarrhea, unspecified: Secondary | ICD-10-CM

## 2019-12-02 DIAGNOSIS — S91109S Unspecified open wound of unspecified toe(s) without damage to nail, sequela: Secondary | ICD-10-CM

## 2019-12-02 NOTE — Progress Notes (Signed)
BP (!) 170/86    Subjective:    Patient ID: [name] [name], male    DOB: 1961-04-18, 59 y.o.   MRN: [id number]  HPI: [name] [name] is a 59 y.o. male presenting on 12/02/2019 for No chief complaint on file.   HPI     [name] [name] was scheduled to come into the office today for in-person appointment to follow up on his HTN but he called the office this morning and said he wasn't coming in because he had diarrhea so he was changed to virtual appointment.  [name] [name] does not have a video-enabled device so the appointment was via Telephone  I connected with  [name] [name] on 12/02/19 by a video enabled telemedicine application and verified that I am speaking with the correct person using two identifiers.   I discussed the limitations of evaluation and management by telemedicine. The patient expressed understanding and agreed to proceed.  [name] [name] is at home.  Provider is at office.       [name] [name] is 59yoM will appointment scheduled today to follow up  in the office For  HTN.    [name] [name] says he has Got 75% off with [employer] financial assistance but complains that he has too many bills.  [name] [name] was also complaining about the bills from his eye appointment at [facility].  He says he has  The number  for financial assistance for [facility] but he hasn't turned in the application yet, he says, because they want to know how much money he has in the bank..  [name] [name] says he Finished the doxycycline given to him by the podiatrist.  [name] [name] checks bp at home and reading is recorded above.     Relevant past medical, surgical, family and social history reviewed and updated as indicated. Interim medical history since our last visit reviewed. Allergies and medications reviewed and updated.    Current Outpatient Medications:  .  acetaminophen (TYLENOL) 500 MG tablet, Take 500 mg by mouth every 6 (six) hours as needed (arthritis). , Disp: , Rfl:  .  amLODipine (NORVASC) 5 MG tablet, Take 1 tablet (5 mg total) by mouth daily., Disp: 90 tablet,  Rfl: 0 .  atorvastatin (LIPITOR) 20 MG tablet, Take 1 tablet (20 mg total) by mouth daily., Disp: 90 tablet, Rfl: 0 .  Calcium Carbonate-Simethicone (ALKA-SELTZER HEARTBURN + GAS) 750-80 MG CHEW, Chew 1 tablet by mouth daily as needed., Disp: , Rfl:  .  famotidine (PEPCID) 20 MG tablet, Take 1 tablet (20 mg total) by mouth 2 (two) times daily as needed for heartburn or indigestion., Disp: 60 tablet, Rfl: 3 .  gabapentin (NEURONTIN) 100 MG capsule, Take 1 capsule by mouth twice daily, Disp: 60 capsule, Rfl: 0 .  glipiZIDE (GLUCOTROL) 5 MG tablet, Take 1 tablet (5 mg total) by mouth daily before breakfast., Disp: 90 tablet, Rfl: 1 .  lisinopril (ZESTRIL) 20 MG tablet, Take 1 tablet (20 mg total) by mouth daily., Disp: 90 tablet, Rfl: 0 .  metoprolol tartrate (LOPRESSOR) 50 MG tablet, Take 1 tablet (50 mg total) by mouth 2 (two) times daily., Disp: 180 tablet, Rfl: 0 .  Omega-3 Fatty Acids (FISH OIL) 1200 MG CAPS, Take 1 capsule (1,200 mg total) by mouth 2 (two) times daily. (Patient taking differently: Take 1 capsule by mouth at bedtime. ), Disp: , Rfl:  .  sitaGLIPtin (JANUVIA) 100 MG tablet, Take 1 tablet (100 mg total) by mouth daily., Disp: 90 tablet, Rfl: 0       Review of Systems  Per HPI unless specifically indicated above     Objective:    BP (!) 170/86   Wt Readings from Last 3 Encounters:  10/21/19 161 lb 8 oz (73.3 kg)  07/22/19 156 lb 1.6 oz (70.8 kg)  08/15/18 122 lb (55.3 kg)    Physical Exam Pulmonary:     Effort: No respiratory distress.  Neurological:     Mental Status: He is alert and oriented to person, place, and time.  Psychiatric:        Attention and Perception: Attention normal.     Comments: [name] [name] is his baseline flat affect with inability to be made happy with suggestions which could help him.           Assessment & Plan:    Encounter Diagnoses  Name Primary?  . Essential hypertension Yes  . Diarrhea, unspecified type   . Open toe wound, sequela       -Again discussed referral to GI to evaluate persistent diarrhea but [name] [name] declines -He has follow up with podiatrist tomorrow and he is strongly encouraged to go to that appointment.  Discussed long-term risks  -encouraged [name] [name] to submit financial assistance paperwork to [facility] but that decision is ultimately up to him -[name] [name] to follow up IN office in 1 month

## 2019-12-03 ENCOUNTER — Encounter: Payer: Self-pay | Admitting: Podiatry

## 2019-12-03 ENCOUNTER — Ambulatory Visit (INDEPENDENT_AMBULATORY_CARE_PROVIDER_SITE_OTHER): Payer: No Typology Code available for payment source | Admitting: Podiatry

## 2019-12-03 ENCOUNTER — Other Ambulatory Visit: Payer: Self-pay

## 2019-12-03 DIAGNOSIS — L97512 Non-pressure chronic ulcer of other part of right foot with fat layer exposed: Secondary | ICD-10-CM

## 2019-12-03 NOTE — Patient Instructions (Signed)
Wash the wound daily with soap and water Apply a small amount of medihoney to the wound daily

## 2019-12-03 NOTE — Progress Notes (Signed)
Subjective: 59 year old male presents the office today for evaluation of a wound on the bottom of his right foot.  He thinks the wound may be doing somewhat better.  Denies any drainage or pus or swelling or redness.  Has been keeping medihoney on the wound daily.  Denies any systemic complaints such as fevers, chills, nausea, vomiting. No acute changes since last appointment, and no other complaints at this time.   Objective: AAO x3, NAD DP/PT pulses palpable bilaterally, CRT less than 3 seconds On the right foot subhallux hyperkeratotic tissue with some hair and dirt within the wound.  Upon debridement of the wound is smaller measuring 0.3 x 0.3 x 0.1 cm with a granular wound base.  There is no surrounding erythema, ascending cellulitis.  There is no fluctuation crepitation but there is no malodor. No open lesions or pre-ulcerative lesions.  No pain with calf compression, swelling, warmth, erythema  Assessment: Left hallux ulceration without signs of infection  Plan: -All treatment options discussed with the patient including all alternatives, risks, complications.  -Debrided the hyperkeratotic tissue in the wound down to healthy, granular tissue utilizing the 312 with scalpel.  Continue with Medihoney dressing changes daily.  Encouraged to wash his feet daily with soap and water.  Offloading at all times. Monitor for any clinical signs or symptoms of infection and directed to call the office immediately should any occur or go to the ER. -Patient encouraged to call the office with any questions, concerns, change in symptoms.   RTC 3-4 weeks or sooner if needed  Trula Slade DPM

## 2019-12-12 IMAGING — MR MR FOOT*R* WO/W CM
9 series · 40 of 40 positions shown · IV contrast (Gadavist)
Comparison: Plain films of the right foot 12/28/2017, 02/28/2018
and 05/23/2018.

CLINICAL DATA: Skin ulceration on the plantar surface of the right
great toe for 4-5 months in a diabetic patient.

EXAM:
MRI OF THE RIGHT FOREFOOT WITHOUT AND WITH CONTRAST
TECHNIQUE: Multiplanar, multisequence MR imaging of the right forefoot was
performed before and after the administration of intravenous
contrast.
CONTRAST:  7.5 cc Gadavist IV.

[Series 4: T1 · coronal · right · 3.0mm · 0.38mm/px · 5 of 45 slices shown (1 of 2)]
[im 1/45]
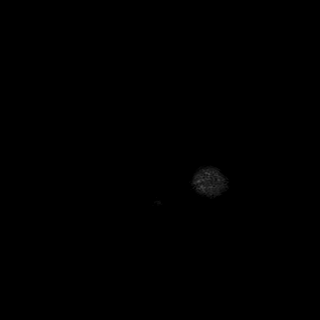
[im 12/45]
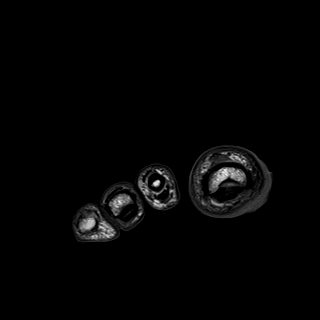
[im 23/45]
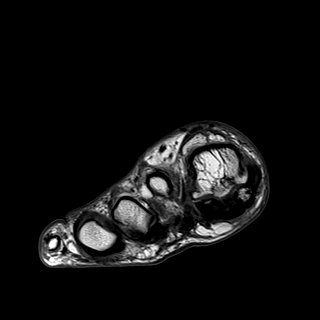
[im 34/45]
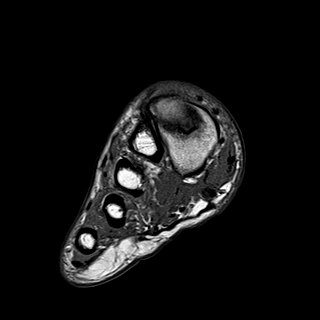
[im 45/45]
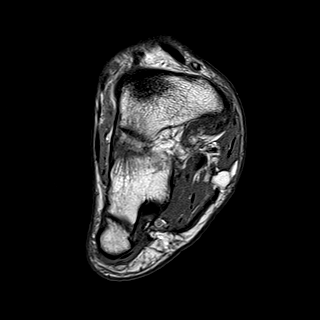

[Series 6: T2 · coronal · right · 3.0mm · 0.38mm/px · 6 of 45 slices shown (1 of 2)]
[im 1/45]
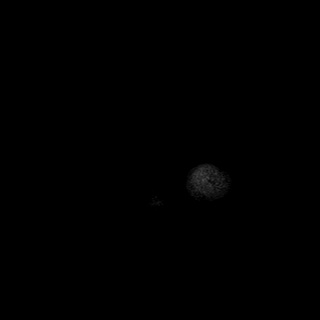
[im 9/45]
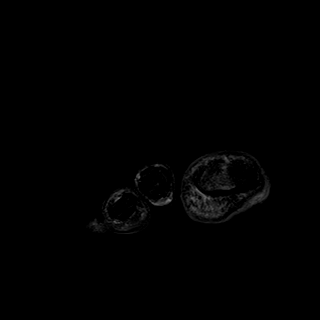
[im 18/45]
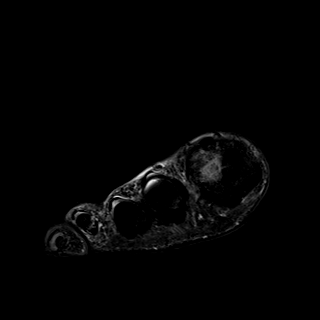
[im 27/45]
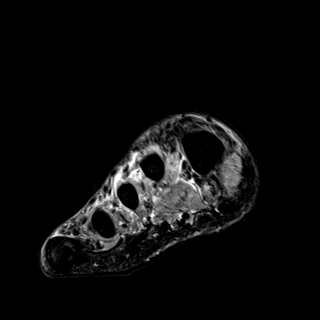
[im 36/45]
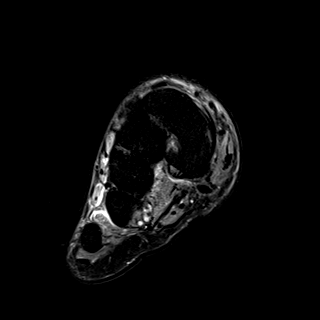
[im 45/45]
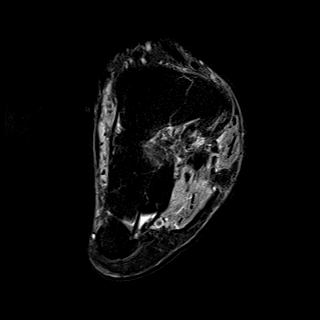

[Series 7: T1 · axial · right · 3.0mm · 0.70mm/px · z∈[-147,-81]mm · 3 of 20 slices shown (2 of 2)]
[im 1/20]
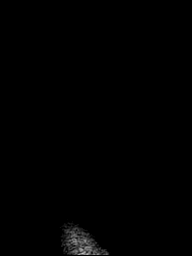
[im 10/20]
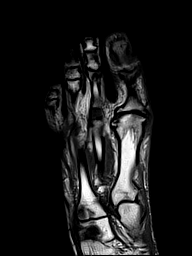
[im 20/20]
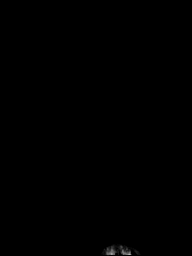

[Series 9: T2 · axial · right · 3.0mm · 0.70mm/px · z∈[-147,-81]mm · 3 of 20 slices shown (2 of 2)]
[im 1/20]
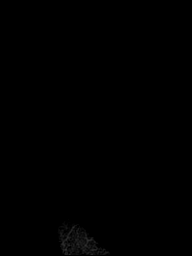
[im 10/20]
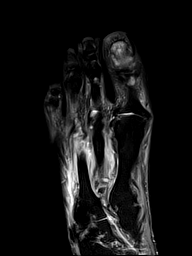
[im 20/20]
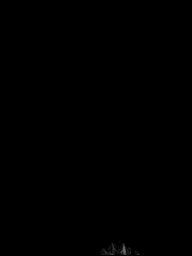

[Series 10: STIR · sagittal · right · 3.0mm · 0.62mm/px · 4 of 29 slices shown]
[im 1/29]
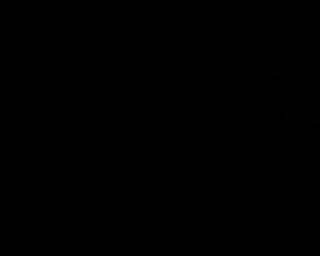
[im 10/29]
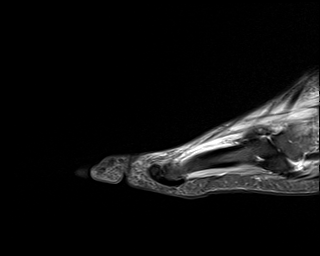
[im 19/29]
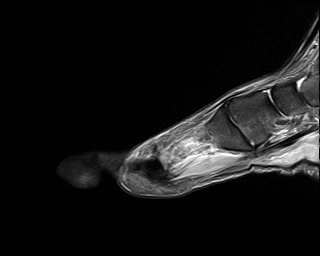
[im 29/29]
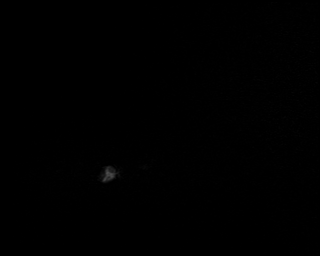

[Series 11: T1 fat-sat · coronal · non-contrast · right · 3.0mm · 0.47mm/px · 6 of 45 slices shown (1 of 3)]
[im 1/45]
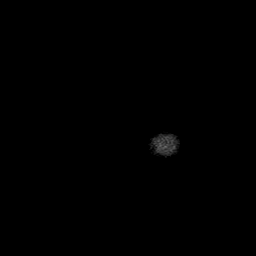
[im 9/45]
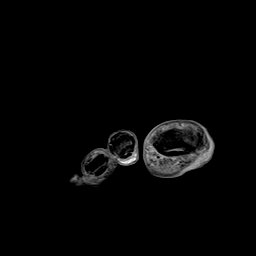
[im 18/45]
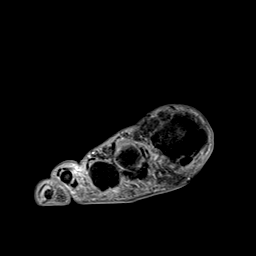
[im 27/45]
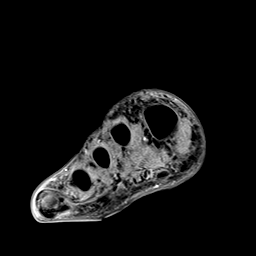
[im 36/45]
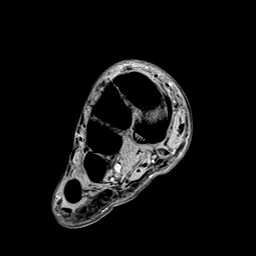
[im 45/45]
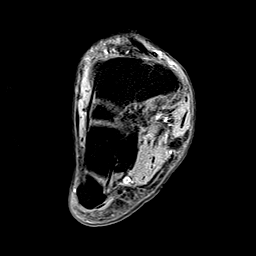

[Series 12: T1 fat-sat post-contrast · coronal · right · 3.0mm · 0.47mm/px · 6 of 45 slices shown]
[im 1/45]
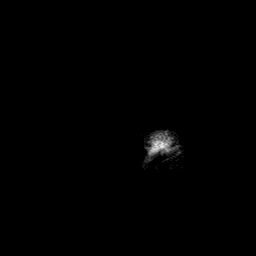
[im 9/45]
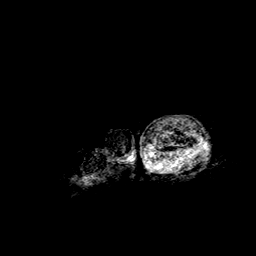
[im 18/45]
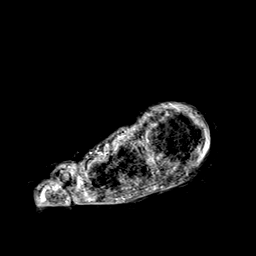
[im 27/45]
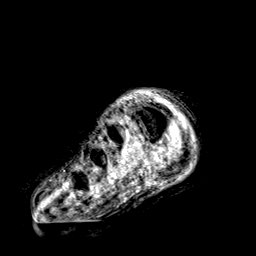
[im 36/45]
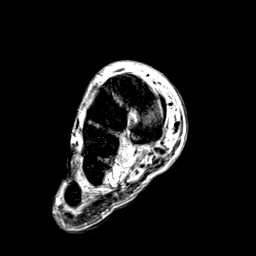
[im 45/45]
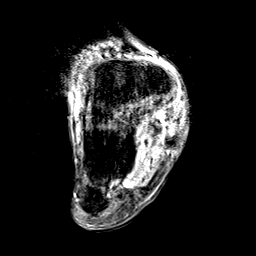

[Series 13: T1 fat-sat · axial · right · 3.0mm · 0.56mm/px · z∈[-147,-81]mm · 3 of 20 slices shown (2 of 3)]
[im 1/20]
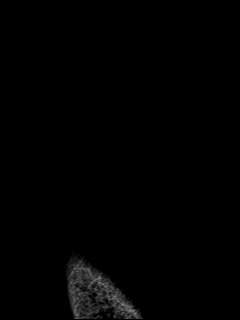
[im 10/20]
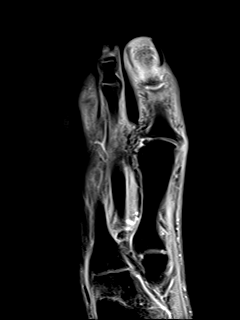
[im 20/20]
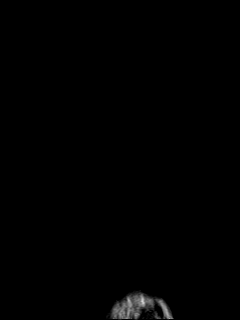

[Series 14: T1 fat-sat · sagittal · right · 3.0mm · 0.62mm/px · 4 of 26 slices shown (3 of 3)]
[im 1/26]
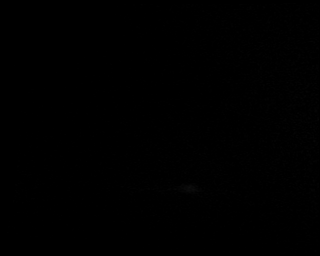
[im 9/26]
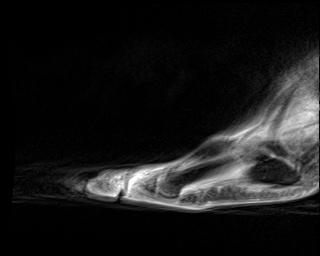
[im 17/26]
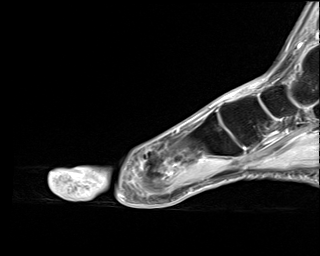
[im 26/26]
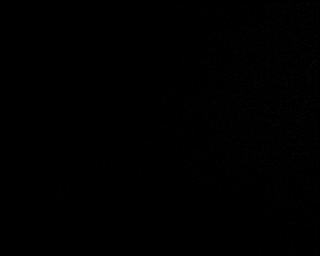

[40 of 40 positions shown; findings below may reference images not displayed]

FINDINGS: Patient motion degrades the study.

Bones/Joint/Cartilage

There is marrow edema and enhancement throughout the distal phalanx
of the great toe consistent with osteomyelitis. No other evidence of
osteomyelitis is identified. The patient has moderately severe to
severe first MTP osteoarthritis with marked joint space narrowing
and bulky osteophytosis about the joint. There is also some
degenerative disease of the first tarsometatarsal joint with a
subchondral cyst in the base of the first metatarsal. No fracture or
stress change is identified.

Ligaments

Intact.

Muscles and Tendons

No muscle or tendon tear. No abscess. Intermediate increased T2
signal in imaged musculature is likely due to diabetic myopathy.

Soft tissues

Skin ulceration on the plantar surface of the great toe is noted. No
underlying abscess. Edema and enhancement in the plantar soft
tissues of the great toe are consistent with cellulitis.
IMPRESSION: Cellulitis of the great toe with findings consistent with
osteomyelitis throughout the distal phalanx. Negative for abscess or
septic joint.

Advanced first MTP osteoarthritis. Mild to moderate osteoarthritis
of the first tarsometatarsal joint is also seen.

## 2019-12-18 ENCOUNTER — Other Ambulatory Visit: Payer: Self-pay | Admitting: Physician Assistant

## 2019-12-26 ENCOUNTER — Other Ambulatory Visit: Payer: Self-pay | Admitting: Physician Assistant

## 2019-12-31 ENCOUNTER — Encounter: Payer: Self-pay | Admitting: Podiatry

## 2019-12-31 ENCOUNTER — Ambulatory Visit (INDEPENDENT_AMBULATORY_CARE_PROVIDER_SITE_OTHER): Payer: No Typology Code available for payment source | Admitting: Podiatry

## 2019-12-31 ENCOUNTER — Other Ambulatory Visit: Payer: Self-pay

## 2019-12-31 DIAGNOSIS — L97512 Non-pressure chronic ulcer of other part of right foot with fat layer exposed: Secondary | ICD-10-CM

## 2019-12-31 MED ORDER — DOXYCYCLINE HYCLATE 100 MG PO TABS
100.0000 mg | ORAL_TABLET | Freq: Two times a day (BID) | ORAL | 0 refills | Status: DC
Start: 2019-12-31 — End: 2020-04-28

## 2020-01-01 NOTE — Progress Notes (Signed)
Subjective: 59 year old male presents the office today for evaluation of a wound on the bottom of his right foot.  He is not sure how the wound is been doing.  He has been keeping Medihoney on the wound daily denies any drainage or pus any swelling or redness.  He has no pain. Denies any systemic complaints such as fevers, chills, nausea, vomiting. No acute changes since last appointment, and no other complaints at this time.   Objective: AAO x3, NAD DP/PT pulses palpable bilaterally, CRT less than 3 seconds On the right foot subhallux hyperkeratotic tissue with central granular wound.  After debridement the wound is larger measuring 0.8 x 0.6 cm.  There was a blister adjacent to the area and there is a small amount of drainage expressed there is no purulence is more serosanguineous.  There is no surrounding erythema, ascending cellulitis there is no fluctuation crepitation but there is malodor. No open lesions or pre-ulcerative lesions.  No pain with calf compression, swelling, warmth, erythema  Assessment: Left hallux ulceration   Plan: -All treatment options discussed with the patient including all alternatives, risks, complications.  -Sharply debrided the wound today utilizing the 312 with scalpel down to healthy, bleeding, viable tissue.  New blisters present with some drainage which I cultured.  Prescribed doxycycline as he tolerated this well previously.  Offloading all times. -Monitor for any clinical signs or symptoms of infection and directed to call the office immediately should any occur or go to the ER.  Return in about 1 week (around 01/07/2020).  Trula Slade DPM

## 2020-01-02 ENCOUNTER — Encounter: Payer: Self-pay | Admitting: Physician Assistant

## 2020-01-02 ENCOUNTER — Ambulatory Visit: Payer: Self-pay | Admitting: Physician Assistant

## 2020-01-02 ENCOUNTER — Other Ambulatory Visit: Payer: Self-pay

## 2020-01-02 VITALS — BP 130/90 | HR 61 | Temp 99.1°F | Ht 67.75 in | Wt 166.2 lb

## 2020-01-02 DIAGNOSIS — E785 Hyperlipidemia, unspecified: Secondary | ICD-10-CM

## 2020-01-02 DIAGNOSIS — E1169 Type 2 diabetes mellitus with other specified complication: Secondary | ICD-10-CM

## 2020-01-02 DIAGNOSIS — Z125 Encounter for screening for malignant neoplasm of prostate: Secondary | ICD-10-CM

## 2020-01-02 DIAGNOSIS — S91109S Unspecified open wound of unspecified toe(s) without damage to nail, sequela: Secondary | ICD-10-CM

## 2020-01-02 DIAGNOSIS — E1142 Type 2 diabetes mellitus with diabetic polyneuropathy: Secondary | ICD-10-CM

## 2020-01-02 DIAGNOSIS — I10 Essential (primary) hypertension: Secondary | ICD-10-CM

## 2020-01-02 DIAGNOSIS — E11319 Type 2 diabetes mellitus with unspecified diabetic retinopathy without macular edema: Secondary | ICD-10-CM

## 2020-01-02 NOTE — Progress Notes (Signed)
BP 130/90   Pulse 61   Temp 99.1 F (37.3 C)   Ht 5' 7.75" (1.721 m)   Wt 166 lb 4 oz (75.4 kg)   SpO2 96%   BMI 25.47 kg/m    Subjective:    Patient ID: [name] [name], male    DOB: 11/24/1960, 59 y.o.   MRN: [id number]  HPI: [name] [name] is a 59 y.o. male presenting on 01/02/2020 for Hypertension   HPI     Pt had a negative covid 19 screening questionnaire.    Pt is 44yoM with appointment today to follow up HTN.  Pt did not bring his meds despite multiple requests for him to bring all meds to every appointment.  He doesn't know what he is taking, he sure says "the same:. He needs to renew with [employer] medication assistance but has to get a paper to submit before he can be approved.  He does not want his medications sent to local pharmacy.  He has not gotten covid vaccination  He has appt for his eyes at a doctor in [place] on [address] on august 2.  Pt has been seeing wound care for his toe wound that has been present for years.     Relevant past medical, surgical, family and social history reviewed and updated as indicated. Interim medical history since our last visit reviewed. Allergies and medications reviewed and updated.  Meds: Unknown    Review of Systems  Per HPI unless specifically indicated above     Objective:    BP 130/90   Pulse 61   Temp 99.1 F (37.3 C)   Ht 5' 7.75" (1.721 m)   Wt 166 lb 4 oz (75.4 kg)   SpO2 96%   BMI 25.47 kg/m   Wt Readings from Last 3 Encounters:  01/02/20 166 lb 4 oz (75.4 kg)  10/21/19 161 lb 8 oz (73.3 kg)  07/22/19 156 lb 1.6 oz (70.8 kg)    Physical Exam Vitals reviewed.  Constitutional:      Appearance: He is well-developed.  HENT:     Head: Normocephalic and atraumatic.  Cardiovascular:     Rate and Rhythm: Normal rate and regular rhythm.  Pulmonary:     Effort: Pulmonary effort is normal.     Breath sounds: Normal breath sounds. No wheezing.  Abdominal:     General: Bowel  sounds are normal.     Palpations: Abdomen is soft.     Tenderness: There is no abdominal tenderness.  Musculoskeletal:     Cervical back: Neck supple.     Right lower leg: No edema.     Left lower leg: No edema.  Lymphadenopathy:     Cervical: No cervical adenopathy.  Skin:    General: Skin is warm and dry.  Neurological:     Mental Status: He is alert and oriented to person, place, and time.  Psychiatric:     Comments: Pt is his baseline grumpy and apathetic.           Assessment & Plan:    Encounter Diagnoses  Name Primary?  . Essential hypertension Yes  . Type 2 diabetes mellitus with other specified complication, unspecified whether long term insulin use ([place])   . Hyperlipidemia, unspecified hyperlipidemia type   . Diabetic polyneuropathy associated with type 2 diabetes mellitus ([place])   . Diabetic retinopathy associated with type 2 diabetes mellitus, macular edema presence unspecified, unspecified laterality, unspecified retinopathy severity ([place])   . Open toe wound,  sequela       -discussed with pt that he needs to get his paperwork so he can get renewed with [employer].  Again pt insists that he does not want to get his meds from a local pharmacy in the interim.  Discussed with pt that being off his meds would likely have negative impact on his health and well-being.  He sill declines.   -pt to continue with wound care per their recommendations -pt to follow up in 1 month with updating labs before appointment.  Pt is reminded to bring all his meds with him to his appointment.  He is to contact office sooner prn

## 2020-01-02 NOTE — Patient Instructions (Addendum)
FOR [facility] [employer] PLEASE PROVIDE:  Chiropractor of Award Letter for disability

## 2020-01-03 LAB — WOUND CULTURE
MICRO NUMBER:: 10727282
SPECIMEN QUALITY:: ADEQUATE

## 2020-01-10 ENCOUNTER — Ambulatory Visit (INDEPENDENT_AMBULATORY_CARE_PROVIDER_SITE_OTHER): Payer: No Typology Code available for payment source | Admitting: Podiatry

## 2020-01-10 ENCOUNTER — Other Ambulatory Visit: Payer: Self-pay

## 2020-01-10 DIAGNOSIS — L97512 Non-pressure chronic ulcer of other part of right foot with fat layer exposed: Secondary | ICD-10-CM

## 2020-01-14 NOTE — Progress Notes (Signed)
Subjective: 59 year old male presents the office today for evaluation of a wound on the bottom of his right foot.  States he has been doing better.  Is not seeing any drainage or pus any swelling.  He has no pain.  He has been keeping Medihoney on the wound daily.  Finish doxycycline about side effects.  No new concerns today. Denies any systemic complaints such as fevers, chills, nausea, vomiting. No acute changes since last appointment, and no other complaints at this time.   Objective: AAO x3, NAD DP/PT pulses palpable bilaterally, CRT less than 3 seconds On the right foot subhallux hyperkeratotic tissue with central granular wound. After debridement the wound is measuring 0.4 x 0.3 cm.  Blister has resolved.  Overall the wound looks much better and superficial with a granular wound base.  There is no surrounding erythema, ascending cellulitis.  No fluctuation crepitation.  No malodor.  No open lesions or pre-ulcerative lesions.  No pain with calf compression, swelling, warmth, erythema  Assessment: Left hallux ulceration   Plan: -All treatment options discussed with the patient including all alternatives, risks, complications.  -Sharply debrided the wound today utilizing the 312 with scalpel down to healthy, bleeding, viable tissue.  Continue with Medihoney dressing changes daily as well as offloading.  Overall the wound is doing better. -Monitor for any clinical signs or symptoms of infection and directed to call the office immediately should any occur or go to the ER.   No follow-ups on file.  Trula Slade DPM

## 2020-01-22 ENCOUNTER — Other Ambulatory Visit: Payer: Self-pay | Admitting: Physician Assistant

## 2020-01-22 MED ORDER — ATORVASTATIN CALCIUM 20 MG PO TABS: ORAL_TABLET | ORAL | 0 refills | Status: DC

## 2020-01-22 MED ORDER — AMLODIPINE BESYLATE 5 MG PO TABS: ORAL_TABLET | ORAL | 0 refills | Status: DC

## 2020-01-23 ENCOUNTER — Ambulatory Visit: Payer: No Typology Code available for payment source | Admitting: Podiatry

## 2020-01-24 ENCOUNTER — Other Ambulatory Visit: Payer: Self-pay | Admitting: Physician Assistant

## 2020-01-27 ENCOUNTER — Ambulatory Visit: Payer: No Typology Code available for payment source | Admitting: Podiatry

## 2020-01-28 ENCOUNTER — Ambulatory Visit: Payer: No Typology Code available for payment source | Admitting: Podiatry

## 2020-01-31 ENCOUNTER — Other Ambulatory Visit (HOSPITAL_COMMUNITY)
Admission: RE | Admit: 2020-01-31 | Discharge: 2020-01-31 | Disposition: A | Discharge status: Home / Self Care | Payer: Self-pay | Source: Ambulatory Visit | Attending: Physician Assistant | Admitting: Physician Assistant

## 2020-01-31 DIAGNOSIS — E1169 Type 2 diabetes mellitus with other specified complication: Secondary | ICD-10-CM | POA: Insufficient documentation

## 2020-01-31 DIAGNOSIS — E1142 Type 2 diabetes mellitus with diabetic polyneuropathy: Secondary | ICD-10-CM | POA: Insufficient documentation

## 2020-01-31 DIAGNOSIS — Z125 Encounter for screening for malignant neoplasm of prostate: Secondary | ICD-10-CM | POA: Insufficient documentation

## 2020-01-31 DIAGNOSIS — I10 Essential (primary) hypertension: Secondary | ICD-10-CM | POA: Insufficient documentation

## 2020-01-31 DIAGNOSIS — E785 Hyperlipidemia, unspecified: Secondary | ICD-10-CM | POA: Insufficient documentation

## 2020-01-31 DIAGNOSIS — E11319 Type 2 diabetes mellitus with unspecified diabetic retinopathy without macular edema: Secondary | ICD-10-CM | POA: Insufficient documentation

## 2020-01-31 LAB — COMPREHENSIVE METABOLIC PANEL
ALT: 17 U/L (ref 0–44)
AST: 19 U/L (ref 15–41)
Albumin: 3.5 g/dL (ref 3.5–5.0)
Alkaline Phosphatase: 85 U/L (ref 38–126)
Anion gap: 7 (ref 5–15)
BUN: 26 mg/dL — ABNORMAL HIGH (ref 6–20)
CO2: 25 mmol/L (ref 22–32)
Calcium: 9 mg/dL (ref 8.9–10.3)
Chloride: 107 mmol/L (ref 98–111)
Creatinine, Ser: 1.75 mg/dL — ABNORMAL HIGH (ref 0.61–1.24)
GFR calc Af Amer: 48 mL/min — ABNORMAL LOW (ref >60)
GFR calc non Af Amer: 41 mL/min — ABNORMAL LOW (ref >60)
Glucose, Bld: 136 mg/dL — ABNORMAL HIGH (ref 70–99)
Potassium: 4.5 mmol/L (ref 3.5–5.1)
Sodium: 139 mmol/L (ref 135–145)
Total Bilirubin: 0.7 mg/dL (ref 0.3–1.2)
Total Protein: 6.5 g/dL (ref 6.5–8.1)

## 2020-01-31 LAB — LIPID PANEL
Cholesterol: 199 mg/dL (ref 0–200)
HDL: 40 mg/dL — ABNORMAL LOW (ref >40)
LDL Cholesterol: 108 mg/dL — ABNORMAL HIGH (ref 0–99)
Total CHOL/HDL Ratio: 5 RATIO
Triglycerides: 255 mg/dL — ABNORMAL HIGH (ref <150)
VLDL: 51 mg/dL — ABNORMAL HIGH (ref 0–40)

## 2020-01-31 LAB — PSA: Prostatic Specific Antigen: 1.24 ng/mL (ref 0.00–4.00)

## 2020-01-31 LAB — HEMOGLOBIN A1C
Hgb A1c MFr Bld: 5 % (ref 4.8–5.6)
Mean Plasma Glucose: 96.8 mg/dL

## 2020-02-04 ENCOUNTER — Ambulatory Visit: Payer: Self-pay | Admitting: Physician Assistant

## 2020-02-04 ENCOUNTER — Encounter: Payer: Self-pay | Admitting: Physician Assistant

## 2020-02-04 ENCOUNTER — Other Ambulatory Visit: Payer: Self-pay

## 2020-02-04 VITALS — BP 120/64 | HR 53 | Temp 99.5°F | Ht 67.75 in | Wt 155.6 lb

## 2020-02-04 DIAGNOSIS — E1142 Type 2 diabetes mellitus with diabetic polyneuropathy: Secondary | ICD-10-CM

## 2020-02-04 DIAGNOSIS — S91109S Unspecified open wound of unspecified toe(s) without damage to nail, sequela: Secondary | ICD-10-CM

## 2020-02-04 DIAGNOSIS — N289 Disorder of kidney and ureter, unspecified: Secondary | ICD-10-CM

## 2020-02-04 DIAGNOSIS — E11319 Type 2 diabetes mellitus with unspecified diabetic retinopathy without macular edema: Secondary | ICD-10-CM

## 2020-02-04 DIAGNOSIS — E785 Hyperlipidemia, unspecified: Secondary | ICD-10-CM

## 2020-02-04 DIAGNOSIS — Z1211 Encounter for screening for malignant neoplasm of colon: Secondary | ICD-10-CM

## 2020-02-04 DIAGNOSIS — I10 Essential (primary) hypertension: Secondary | ICD-10-CM

## 2020-02-04 DIAGNOSIS — Z2821 Immunization not carried out because of patient refusal: Secondary | ICD-10-CM

## 2020-02-04 DIAGNOSIS — E1169 Type 2 diabetes mellitus with other specified complication: Secondary | ICD-10-CM

## 2020-02-04 MED ORDER — LISINOPRIL 20 MG PO TABS: ORAL_TABLET | ORAL | 0 refills | Status: DC

## 2020-02-04 MED ORDER — AMLODIPINE BESYLATE 5 MG PO TABS: ORAL_TABLET | ORAL | 0 refills | Status: DC

## 2020-02-04 MED ORDER — METOPROLOL TARTRATE 50 MG PO TABS: 50.0000 mg | ORAL_TABLET | Freq: Two times a day (BID) | ORAL | 0 refills | Status: DC

## 2020-02-04 MED ORDER — ATORVASTATIN CALCIUM 20 MG PO TABS: ORAL_TABLET | ORAL | 0 refills | Status: DC

## 2020-02-04 MED ORDER — GLIPIZIDE 5 MG PO TABS: 5.0000 mg | ORAL_TABLET | Freq: Every day | ORAL | 0 refills | Status: DC

## 2020-02-04 MED ORDER — METOPROLOL TARTRATE 50 MG PO TABS: 50.0000 mg | ORAL_TABLET | Freq: Two times a day (BID) | ORAL | 1 refills | Status: DC

## 2020-02-04 NOTE — Progress Notes (Signed)
BP 120/64   Pulse (!) 53   Temp 99.5 F (37.5 C)   Ht 5' 7.75" (1.721 m)   Wt 155 lb 9.6 oz (70.6 kg)   SpO2 99%   BMI 23.83 kg/m    Subjective:    Patient ID: [name] [name], male    DOB: 02/19/1961, 59 y.o.   MRN: [id number]  HPI: [name] [name] is a 59 y.o. male presenting on 02/04/2020 for Follow-up   HPI     Pt had a negative covid 19 screening questionnaire.   Pt is 57yoM with DM, htn, dyslipidemia, sore on great toe and neuropathy who presents today for routine follow up.  Pt brought in his meds today although some are just empty bottles.  Pt is working with [facility] to get renewed with [facility].  Pt sees podiatrist for toe ulcer.  Pt has no new complaints today.  Pt has not yet gotten covid vaccination.      Relevant past medical, surgical, family and social history reviewed and updated as indicated. Interim medical history since our last visit reviewed. Allergies and medications reviewed and updated.   Current Outpatient Medications:  .  acetaminophen (TYLENOL) 500 MG tablet, Take 500 mg by mouth every 6 (six) hours as needed (arthritis). , Disp: , Rfl:  .  amLODipine (NORVASC) 5 MG tablet, TAKE 1 Tablet BY MOUTH ONCE EVERY DAY, Disp: 30 tablet, Rfl: 0 .  atorvastatin (LIPITOR) 20 MG tablet, TAKE 1 Tablet BY MOUTH ONCE EVERY DAY, Disp: 30 tablet, Rfl: 0 .  Calcium Carbonate-Simethicone (ALKA-SELTZER HEARTBURN + GAS) 750-80 MG CHEW, Chew 1 tablet by mouth daily as needed., Disp: , Rfl:  .  doxycycline (VIBRA-TABS) 100 MG tablet, Take 1 tablet (100 mg total) by mouth 2 (two) times daily., Disp: 20 tablet, Rfl: 0 .  famotidine (PEPCID) 20 MG tablet, Take 1 tablet (20 mg total) by mouth 2 (two) times daily as needed for heartburn or indigestion., Disp: 60 tablet, Rfl: 3 .  gabapentin (NEURONTIN) 100 MG capsule, Take 1 capsule by mouth twice daily, Disp: 60 capsule, Rfl: 0 .  glipiZIDE (GLUCOTROL) 5 MG tablet, Take 1 tablet (5 mg total) by mouth daily  before breakfast., Disp: 90 tablet, Rfl: 1 .  lisinopril (ZESTRIL) 20 MG tablet, TAKE 1 Tablet BY MOUTH ONCE EVERY DAY, Disp: 90 tablet, Rfl: 0 .  Omega-3 Fatty Acids (FISH OIL) 1200 MG CAPS, Take 1 capsule (1,200 mg total) by mouth 2 (two) times daily. (Patient taking differently: Take 1 capsule by mouth at bedtime. ), Disp: , Rfl:  .  JANUVIA 100 MG tablet, TAKE 1 Tablet BY MOUTH ONCE EVERY DAY (Patient not taking: Reported on 02/04/2020), Disp: 90 tablet, Rfl: 0 .  metoprolol tartrate (LOPRESSOR) 50 MG tablet, TAKE 1 Tablet  BY MOUTH TWICE DAILY (Patient not taking: Reported on 02/04/2020), Disp: 180 tablet, Rfl: 0     Review of Systems  Per HPI unless specifically indicated above     Objective:    BP 120/64   Pulse (!) 53   Temp 99.5 F (37.5 C)   Ht 5' 7.75" (1.721 m)   Wt 155 lb 9.6 oz (70.6 kg)   SpO2 99%   BMI 23.83 kg/m   Wt Readings from Last 3 Encounters:  02/04/20 155 lb 9.6 oz (70.6 kg)  01/02/20 166 lb 4 oz (75.4 kg)  10/21/19 161 lb 8 oz (73.3 kg)    Physical Exam Vitals reviewed.  Constitutional:      General:  He is not in acute distress.    Appearance: He is well-developed.  HENT:     Head: Normocephalic and atraumatic.  Cardiovascular:     Rate and Rhythm: Normal rate and regular rhythm.  Pulmonary:     Effort: Pulmonary effort is normal.     Breath sounds: Normal breath sounds. No wheezing.  Abdominal:     General: Bowel sounds are normal.     Palpations: Abdomen is soft.     Tenderness: There is no abdominal tenderness.  Musculoskeletal:     Cervical back: Neck supple.     Right lower leg: No edema.     Left lower leg: No edema.  Lymphadenopathy:     Cervical: No cervical adenopathy.  Skin:    General: Skin is warm and dry.  Neurological:     Mental Status: He is alert and oriented to person, place, and time.  Psychiatric:        Behavior: Behavior normal.     Results for orders placed or performed during the hospital encounter of  01/31/20  Lipid panel  Result Value Ref Range   Cholesterol 199 0 - 200 mg/dL   Triglycerides 255 (H) <150 mg/dL   HDL 40 (L) >40 mg/dL   Total CHOL/HDL Ratio 5.0 RATIO   VLDL 51 (H) 0 - 40 mg/dL   LDL Cholesterol 108 (H) 0 - 99 mg/dL  Hemoglobin A1c  Result Value Ref Range   Hgb A1c MFr Bld 5.0 4.8 - 5.6 %   Mean Plasma Glucose 96.8 mg/dL  PSA  Result Value Ref Range   Prostatic Specific Antigen 1.24 0.00 - 4.00 ng/mL  Comprehensive metabolic panel  Result Value Ref Range   Sodium 139 135 - 145 mmol/L   Potassium 4.5 3.5 - 5.1 mmol/L   Chloride 107 98 - 111 mmol/L   CO2 25 22 - 32 mmol/L   Glucose, Bld 136 (H) 70 - 99 mg/dL   BUN 26 (H) 6 - 20 mg/dL   Creatinine, Ser 1.75 (H) 0.61 - 1.24 mg/dL   Calcium 9.0 8.9 - 10.3 mg/dL   Total Protein 6.5 6.5 - 8.1 g/dL   Albumin 3.5 3.5 - 5.0 g/dL   AST 19 15 - 41 U/L   ALT 17 0 - 44 U/L   Alkaline Phosphatase 85 38 - 126 U/L   Total Bilirubin 0.7 0.3 - 1.2 mg/dL   GFR calc non Af Amer 41 (L) >60 mL/min   GFR calc Af Amer 48 (L) >60 mL/min   Anion gap 7 5 - 15      Assessment & Plan:    Encounter Diagnoses  Name Primary?  . Essential hypertension Yes  . Type 2 diabetes mellitus with other specified complication, unspecified whether long term insulin use ([place])   . Hyperlipidemia, unspecified hyperlipidemia type   . Diabetic polyneuropathy associated with type 2 diabetes mellitus ([place])   . Diabetic retinopathy associated with type 2 diabetes mellitus, macular edema presence unspecified, unspecified laterality, unspecified retinopathy severity ([place])   . Open toe wound, sequela   . Screening for colon cancer   . Impaired renal function   . Influenza vaccination declined      -reviewed labs with pt -will discontinue [place].  Continue glipizide and DM diet. -pt counseled to Drink enough water.  He says he drinks very little.  Discussed impaired renal function which is likely results.  -recomended covid vaccination and showed  graphic of risk for severe outcomes and he  said he wanted to get it.  Discussed where he can get it locally -He declined influenza vaccination appointment -pt is given ifobt for colon cancer screening -pt to continue with podiatrist per his recomendation -pt to follow up  3 months.  He is to contact office sooner prn

## 2020-02-08 ENCOUNTER — Other Ambulatory Visit: Payer: Self-pay | Admitting: Physician Assistant

## 2020-02-08 MED ORDER — GLIPIZIDE 5 MG PO TABS: 5.0000 mg | ORAL_TABLET | Freq: Every day | ORAL | 1 refills | Status: DC

## 2020-03-03 ENCOUNTER — Other Ambulatory Visit: Payer: Self-pay | Admitting: Physician Assistant

## 2020-03-03 ENCOUNTER — Ambulatory Visit: Payer: No Typology Code available for payment source | Admitting: Podiatry

## 2020-03-03 MED ORDER — AMLODIPINE BESYLATE 5 MG PO TABS: ORAL_TABLET | ORAL | 0 refills | Status: DC

## 2020-03-03 MED ORDER — ATORVASTATIN CALCIUM 20 MG PO TABS: ORAL_TABLET | ORAL | 0 refills | Status: DC

## 2020-03-03 MED ORDER — METOPROLOL TARTRATE 50 MG PO TABS: 50.0000 mg | ORAL_TABLET | Freq: Two times a day (BID) | ORAL | 0 refills | Status: DC

## 2020-03-03 MED ORDER — LISINOPRIL 20 MG PO TABS: ORAL_TABLET | ORAL | 0 refills | Status: DC

## 2020-03-03 MED ORDER — GLIPIZIDE 5 MG PO TABS: 5.0000 mg | ORAL_TABLET | Freq: Every day | ORAL | 0 refills | Status: DC

## 2020-03-06 ENCOUNTER — Telehealth: Payer: Self-pay

## 2020-03-06 NOTE — Telephone Encounter (Signed)
Mother [name] called stating her sons medications came in the mail and she cannot "figure out what he is suposted to take" She states her son is just lying in the bed and will not move around and she has to make him eat. Mother shared she was recently positive with covid on 02/17/20 but her son was never tested. Recommended calling EMS may be needed to get him checked urgently. Patient's primary care is closed today at [facility]. Mother tearful. Will attempt to call son and review medications with him.  Last spoke to client on 03/03/20 and recommended then for him to call his provider to let them know he was not feeling well and had potential covid exposure with his mother positive. Client replied "okay"  Today again recommended to mother that her son may need immediate emergent care, otherwise to discuss more I will need to speak to her son. Also recommended again that [facility] to arrange to be seen in office or by phone prior to his next appointment. She states understanding.

## 2020-03-06 NOTE — Telephone Encounter (Signed)
Called client third attempt he answered and stated he was trying to sleep. Attempted to discuss how he was feeling and recommended he go to the emergency room to be checked and client emphatically answered NO I am trying to sleep. Discussed his health issues and need to be monitored especially in light of close positive covid exposures in mother and he did not get checked. Client again stated NO. Attempted to discuss and review medications and client would not. He reports he ate something earlier and ate some crumb cake and will eat a hamburger later. Client would not answer if he is able to get up and move around. Again recommended he needs to be checked at hospital and importance of this due to his health issues. Client did not respond at all. Also recommended again as discussed with client on 03/03/20 that he should call his primary care provider to be seen by video or in office due to illness before his next scheduled appointment that is in November. Client would not respond at all and again stated he was trying to rest.  Will plan to attempt to follow up next week by phone. Client stated ok and call ended.

## 2020-03-06 NOTE — Telephone Encounter (Signed)
After receiving call from Mother Curran Lenderman, attempted x 2 to call patient. No answer and not able to leave a voicemail.

## 2020-03-20 ENCOUNTER — Other Ambulatory Visit: Payer: Self-pay | Admitting: Physician Assistant

## 2020-04-09 ENCOUNTER — Encounter: Payer: Self-pay | Admitting: Physician Assistant

## 2020-04-09 ENCOUNTER — Ambulatory Visit: Payer: Self-pay | Admitting: Physician Assistant

## 2020-04-09 VITALS — BP 146/74 | HR 57 | Temp 98.1°F | Wt 163.0 lb

## 2020-04-09 DIAGNOSIS — E1142 Type 2 diabetes mellitus with diabetic polyneuropathy: Secondary | ICD-10-CM

## 2020-04-09 DIAGNOSIS — N289 Disorder of kidney and ureter, unspecified: Secondary | ICD-10-CM

## 2020-04-09 DIAGNOSIS — E785 Hyperlipidemia, unspecified: Secondary | ICD-10-CM

## 2020-04-09 DIAGNOSIS — R600 Localized edema: Secondary | ICD-10-CM

## 2020-04-09 DIAGNOSIS — E1169 Type 2 diabetes mellitus with other specified complication: Secondary | ICD-10-CM

## 2020-04-09 DIAGNOSIS — R0989 Other specified symptoms and signs involving the circulatory and respiratory systems: Secondary | ICD-10-CM

## 2020-04-09 DIAGNOSIS — I10 Essential (primary) hypertension: Secondary | ICD-10-CM

## 2020-04-09 DIAGNOSIS — S91109S Unspecified open wound of unspecified toe(s) without damage to nail, sequela: Secondary | ICD-10-CM

## 2020-04-09 DIAGNOSIS — E11319 Type 2 diabetes mellitus with unspecified diabetic retinopathy without macular edema: Secondary | ICD-10-CM

## 2020-04-09 NOTE — Patient Instructions (Signed)

## 2020-04-09 NOTE — Progress Notes (Signed)
BP (!) 146/74   Pulse (!) 57   Temp 98.1 F (36.7 C)   Wt 163 lb (73.9 kg)   SpO2 97%   BMI 24.97 kg/m    Subjective:    Patient ID: [name] [name], male    DOB: 02/14/1961, 59 y.o.   MRN: [id number]  HPI: Linsey Hirota is a 59 y.o. male presenting on 04/09/2020 for No chief complaint on file.   HPI     Pt had a negative covid 19 screening questionnaire.   Pt is 59yoM who called for LE swelling and is here today to be seen for this.  LE swelling x 4 wk.  He props up his feet.  It Waxes and wanes but never goes away   He is still seeing specialist for his toe- has appt 11/9.  He hasn't been since august.  He says he was sick in september.  He never got covid vaccination.  Discussed that it may or may not have been covid that he was sick with.  Pt is currently having No sob.  No CP.        Relevant past medical, surgical, family and social history reviewed and updated as indicated. Interim medical history since our last visit reviewed. Allergies and medications reviewed and updated.   Current Outpatient Medications:  .  acetaminophen (TYLENOL) 500 MG tablet, Take 500 mg by mouth every 6 (six) hours as needed (arthritis). , Disp: , Rfl:  .  amLODipine (NORVASC) 5 MG tablet, TAKE 1 Tablet BY MOUTH ONCE EVERY DAY, Disp: 90 tablet, Rfl: 0 .  atorvastatin (LIPITOR) 20 MG tablet, TAKE 1 Tablet BY MOUTH ONCE EVERY DAY, Disp: 90 tablet, Rfl: 0 .  Calcium Carbonate-Simethicone (ALKA-SELTZER HEARTBURN + GAS) 750-80 MG CHEW, Chew 1 tablet by mouth daily as needed., Disp: , Rfl:  .  doxycycline (VIBRA-TABS) 100 MG tablet, Take 1 tablet (100 mg total) by mouth 2 (two) times daily., Disp: 20 tablet, Rfl: 0 .  famotidine (PEPCID) 20 MG tablet, Take 1 tablet (20 mg total) by mouth 2 (two) times daily as needed for heartburn or indigestion., Disp: 60 tablet, Rfl: 3 .  gabapentin (NEURONTIN) 100 MG capsule, Take 1 capsule by mouth twice daily, Disp: 60 capsule, Rfl: 0 .  glipiZIDE  (GLUCOTROL) 5 MG tablet, Take 1 tablet (5 mg total) by mouth daily before breakfast., Disp: 90 tablet, Rfl: 0 .  lisinopril (ZESTRIL) 20 MG tablet, TAKE 1 Tablet BY MOUTH ONCE EVERY DAY, Disp: 90 tablet, Rfl: 0 .  metoprolol tartrate (LOPRESSOR) 50 MG tablet, Take 1 tablet (50 mg total) by mouth 2 (two) times daily., Disp: 90 tablet, Rfl: 0 .  Omega-3 Fatty Acids (FISH OIL) 1200 MG CAPS, Take 1 capsule (1,200 mg total) by mouth 2 (two) times daily. (Patient not taking: Reported on 04/09/2020), Disp: , Rfl:     Review of Systems  Per HPI unless specifically indicated above     Objective:    BP (!) 146/74   Pulse (!) 57   Temp 98.1 F (36.7 C)   Wt 163 lb (73.9 kg)   SpO2 97%   BMI 24.97 kg/m   Wt Readings from Last 3 Encounters:  04/09/20 163 lb (73.9 kg)  02/04/20 155 lb 9.6 oz (70.6 kg)  01/02/20 166 lb 4 oz (75.4 kg)    Physical Exam Vitals reviewed.  Constitutional:      General: He is not in acute distress.    Appearance: He is well-developed. He is  not toxic-appearing.  HENT:     Head: Normocephalic and atraumatic.  Cardiovascular:     Rate and Rhythm: Normal rate and regular rhythm.     Pulses:          Dorsalis pedis pulses are 1+ on the right side and 1+ on the left side.       Posterior tibial pulses are 1+ on the right side and 1+ on the left side.     Comments: Very difficult to palpate pedal pulses.   Pulmonary:     Effort: Pulmonary effort is normal. No respiratory distress.     Breath sounds: Normal breath sounds. No wheezing.  Abdominal:     General: Bowel sounds are normal.     Palpations: Abdomen is soft.     Tenderness: There is no abdominal tenderness.  Musculoskeletal:     Cervical back: Neck supple.     Right lower leg: Edema present.     Left lower leg: Edema present.  Feet:     Comments: Both feet cold to touch.  Pt has diminished sensation bilaterally.  Color is pale but matches pt skin tone.  No cyanosis.   Lymphadenopathy:     Cervical:  No cervical adenopathy.  Skin:    General: Skin is warm and dry.  Neurological:     Mental Status: He is alert and oriented to person, place, and time.  Psychiatric:        Attention and Perception: Attention normal.        Behavior: Behavior is cooperative.     Comments: Pt is at baseline            Assessment & Plan:    Encounter Diagnoses  Name Primary?  . Lower extremity edema Yes  . Essential hypertension   . Type 2 diabetes mellitus with other specified complication, unspecified whether long term insulin use ([place])   . Hyperlipidemia, unspecified hyperlipidemia type   . Diabetic polyneuropathy associated with type 2 diabetes mellitus ([place])   . Diabetic retinopathy associated with type 2 diabetes mellitus, macular edema presence unspecified, unspecified laterality, unspecified retinopathy severity ([place])   . Open toe wound, sequela   . Impaired renal function   . Diminished pulses in lower extremity     -Pt doesn't know when his CAFA expires.  He is to check it.  He is given another application in case he needs one. -Order ABI -Discussed with pt that no lasix due to impaired renal function. -F/u here november as scheduled with update labs prior to appointment

## 2020-04-13 ENCOUNTER — Other Ambulatory Visit: Payer: Self-pay | Admitting: Student

## 2020-04-13 DIAGNOSIS — R600 Localized edema: Secondary | ICD-10-CM

## 2020-04-13 DIAGNOSIS — R0989 Other specified symptoms and signs involving the circulatory and respiratory systems: Secondary | ICD-10-CM

## 2020-04-17 ENCOUNTER — Other Ambulatory Visit: Payer: Self-pay

## 2020-04-17 ENCOUNTER — Ambulatory Visit (HOSPITAL_COMMUNITY)
Admission: RE | Admit: 2020-04-17 | Discharge: 2020-04-17 | Disposition: A | Discharge status: Home / Self Care | Payer: Self-pay | Source: Ambulatory Visit | Attending: Physician Assistant | Admitting: Physician Assistant

## 2020-04-17 DIAGNOSIS — R0989 Other specified symptoms and signs involving the circulatory and respiratory systems: Secondary | ICD-10-CM | POA: Insufficient documentation

## 2020-04-17 DIAGNOSIS — R600 Localized edema: Secondary | ICD-10-CM | POA: Insufficient documentation

## 2020-04-21 ENCOUNTER — Other Ambulatory Visit: Payer: Self-pay

## 2020-04-21 ENCOUNTER — Ambulatory Visit (INDEPENDENT_AMBULATORY_CARE_PROVIDER_SITE_OTHER): Payer: Self-pay | Admitting: Podiatry

## 2020-04-21 DIAGNOSIS — L97512 Non-pressure chronic ulcer of other part of right foot with fat layer exposed: Secondary | ICD-10-CM

## 2020-04-21 DIAGNOSIS — R609 Edema, unspecified: Secondary | ICD-10-CM

## 2020-04-23 ENCOUNTER — Other Ambulatory Visit: Payer: Self-pay | Admitting: Physician Assistant

## 2020-04-24 NOTE — Progress Notes (Signed)
Subjective: 59 year old male presents the office today for evaluation of a wound on the bottom of his right foot.  He states the area is doing well but the calluses become thick.  He is not seeing any drainage or pus coming from the area deficits in swelling or redness of the toe itself.  He has had swelling to the right leg he states he recently went to have an ultrasound performed but he is not sure of the results.  The swelling is a new issue since I last saw him. Denies any systemic complaints such as fevers, chills, nausea, vomiting. No acute changes since last appointment, and no other complaints at this time.   Objective: AAO x3, NAD DP/PT pulses palpable 1/4 bilaterally, CRT less than 3 seconds Along the plantar aspect of the right hallux is a thick hyperkeratotic lesion.  Upon debridement there is no underlying ulceration drainage or signs of infection appears the wound is healed at this time.  There is no edema, erythema to the toe itself. There is edema to the right leg but there is no erythema or warmth. No open lesions or pre-ulcerative lesions.  No pain with calf compression, warmth, erythema  Assessment: Left hallux ulceration-resolved however with leg swelling  Plan: -All treatment options discussed with the patient including all alternatives, risks, complications.  -Debrided hyperkeratotic tissue without any complications or bleeding.  Appears of the wound is healed.  Recommend moisturizer and offloading daily. -Patient is describing arterial studies are performed.  I will try get the results of the ultrasound.  He has follow-up with his primary care physician for this as well already.  Return in about 4 weeks (around 05/19/2020).  Trula Slade DPM

## 2020-04-27 ENCOUNTER — Other Ambulatory Visit (HOSPITAL_COMMUNITY)
Admission: RE | Admit: 2020-04-27 | Discharge: 2020-04-27 | Disposition: A | Discharge status: Home / Self Care | Payer: Self-pay | Source: Ambulatory Visit | Attending: Physician Assistant | Admitting: Physician Assistant

## 2020-04-27 DIAGNOSIS — E785 Hyperlipidemia, unspecified: Secondary | ICD-10-CM | POA: Insufficient documentation

## 2020-04-27 DIAGNOSIS — I10 Essential (primary) hypertension: Secondary | ICD-10-CM | POA: Insufficient documentation

## 2020-04-27 DIAGNOSIS — E1169 Type 2 diabetes mellitus with other specified complication: Secondary | ICD-10-CM | POA: Insufficient documentation

## 2020-04-27 LAB — LIPID PANEL
Cholesterol: 194 mg/dL (ref 0–200)
HDL: 46 mg/dL (ref >40)
LDL Cholesterol: 102 mg/dL — ABNORMAL HIGH (ref 0–99)
Total CHOL/HDL Ratio: 4.2 RATIO
Triglycerides: 228 mg/dL — ABNORMAL HIGH (ref <150)
VLDL: 46 mg/dL — ABNORMAL HIGH (ref 0–40)

## 2020-04-27 LAB — COMPREHENSIVE METABOLIC PANEL
ALT: 15 U/L (ref 0–44)
AST: 20 U/L (ref 15–41)
Albumin: 3.1 g/dL — ABNORMAL LOW (ref 3.5–5.0)
Alkaline Phosphatase: 115 U/L (ref 38–126)
Anion gap: 7 (ref 5–15)
BUN: 17 mg/dL (ref 8–23)
CO2: 26 mmol/L (ref 22–32)
Calcium: 8.8 mg/dL — ABNORMAL LOW (ref 8.9–10.3)
Chloride: 106 mmol/L (ref 98–111)
Creatinine, Ser: 1.29 mg/dL — ABNORMAL HIGH (ref 0.61–1.24)
GFR, Estimated: >60 mL/min (ref >60)
Glucose, Bld: 111 mg/dL — ABNORMAL HIGH (ref 70–99)
Potassium: 4.1 mmol/L (ref 3.5–5.1)
Sodium: 139 mmol/L (ref 135–145)
Total Bilirubin: 0.9 mg/dL (ref 0.3–1.2)
Total Protein: 6.3 g/dL — ABNORMAL LOW (ref 6.5–8.1)

## 2020-04-27 LAB — HEMOGLOBIN A1C
Hgb A1c MFr Bld: 5.2 % (ref 4.8–5.6)
Mean Plasma Glucose: 102.54 mg/dL

## 2020-04-28 ENCOUNTER — Encounter: Payer: Self-pay | Admitting: Physician Assistant

## 2020-04-28 ENCOUNTER — Ambulatory Visit: Payer: Self-pay | Admitting: Physician Assistant

## 2020-04-28 VITALS — BP 180/90 | HR 75 | Temp 98.4°F | Ht 67.75 in | Wt 159.5 lb

## 2020-04-28 DIAGNOSIS — I10 Essential (primary) hypertension: Secondary | ICD-10-CM

## 2020-04-28 DIAGNOSIS — E785 Hyperlipidemia, unspecified: Secondary | ICD-10-CM

## 2020-04-28 DIAGNOSIS — E119 Type 2 diabetes mellitus without complications: Secondary | ICD-10-CM

## 2020-04-28 MED ORDER — LISINOPRIL 20 MG PO TABS: ORAL_TABLET | ORAL | 1 refills | Status: DC

## 2020-04-28 NOTE — Progress Notes (Signed)
BP (!) 180/90   Pulse 75   Temp 98.4 F (36.9 C)   Ht 5' 7.75" (1.721 m)   Wt 159 lb 8 oz (72.3 kg)   SpO2 99%   BMI 24.43 kg/m    Subjective:    Patient ID: [name] [name], male    DOB: Mar 18, 1961, 59 y.o.   MRN: [id number]  HPI: [name] [name] is a 59 y.o. male presenting on 04/28/2020 for Hypertension, Hyperlipidemia, and Diabetes   HPI    Pt had a negative covid 19 screening questionnaire.     Chief Complaint  Patient presents with  . Hypertension  . Hyperlipidemia  . Diabetes     Pt has no new complaints today.  He is completely out of his lisinopril and only takes his metoprolol qd.  He has appointment soon to follow up with podiatrist.     Relevant past medical, surgical, family and social history reviewed and updated as indicated. Interim medical history since our last visit reviewed. Allergies and medications reviewed and updated.   Current Outpatient Medications:  .  acetaminophen (TYLENOL) 500 MG tablet, Take 500 mg by mouth every 6 (six) hours as needed (arthritis). , Disp: , Rfl:  .  amLODipine (NORVASC) 5 MG tablet, TAKE 1 Tablet BY MOUTH ONCE EVERY DAY, Disp: 90 tablet, Rfl: 0 .  atorvastatin (LIPITOR) 20 MG tablet, TAKE 1 Tablet BY MOUTH ONCE EVERY DAY, Disp: 90 tablet, Rfl: 0 .  Calcium Carbonate-Simethicone (ALKA-SELTZER HEARTBURN + GAS) 750-80 MG CHEW, Chew 1 tablet by mouth daily as needed., Disp: , Rfl:  .  famotidine (PEPCID) 20 MG tablet, Take 1 tablet (20 mg total) by mouth 2 (two) times daily as needed for heartburn or indigestion., Disp: 60 tablet, Rfl: 3 .  gabapentin (NEURONTIN) 100 MG capsule, Take 1 capsule by mouth twice daily, Disp: 60 capsule, Rfl: 0 .  glipiZIDE (GLUCOTROL) 5 MG tablet, Take 1 tablet (5 mg total) by mouth daily before breakfast., Disp: 90 tablet, Rfl: 0 .  Omega-3 Fatty Acids (FISH OIL) 1200 MG CAPS, Take 1 capsule (1,200 mg total) by mouth 2 (two) times daily., Disp: , Rfl:  .  prednisoLONE acetate (PRED  FORTE) 1 % ophthalmic suspension, 1 drop 4 (four) times daily., Disp: , Rfl:  .  lisinopril (ZESTRIL) 20 MG tablet, TAKE 1 Tablet BY MOUTH ONCE EVERY DAY (Patient not taking: Reported on 04/28/2020), Disp: 90 tablet, Rfl: 0 .  metoprolol tartrate (LOPRESSOR) 50 MG tablet, Take 1 tablet (50 mg total) by mouth 2 (two) times daily., Disp: 90 tablet, Rfl: 0    Review of Systems  Per HPI unless specifically indicated above     Objective:    BP (!) 180/90   Pulse 75   Temp 98.4 F (36.9 C)   Ht 5' 7.75" (1.721 m)   Wt 159 lb 8 oz (72.3 kg)   SpO2 99%   BMI 24.43 kg/m   Wt Readings from Last 3 Encounters:  04/28/20 159 lb 8 oz (72.3 kg)  04/09/20 163 lb (73.9 kg)  02/04/20 155 lb 9.6 oz (70.6 kg)    Physical Exam Vitals reviewed.  Constitutional:      General: He is not in acute distress.    Appearance: He is well-developed.  HENT:     Head: Normocephalic and atraumatic.  Cardiovascular:     Rate and Rhythm: Normal rate and regular rhythm.  Pulmonary:     Effort: Pulmonary effort is normal.     Breath sounds:  Normal breath sounds. No wheezing.  Abdominal:     General: Bowel sounds are normal.     Palpations: Abdomen is soft.     Tenderness: There is no abdominal tenderness.  Musculoskeletal:     Cervical back: Neck supple.     Right lower leg: Edema (trace) present.     Left lower leg: Edema (trace) present.  Lymphadenopathy:     Cervical: No cervical adenopathy.  Skin:    General: Skin is warm and dry.  Neurological:     Mental Status: He is alert and oriented to person, place, and time.  Psychiatric:        Behavior: Behavior normal. Behavior is cooperative.     Comments: Pt at baseline, general discontent     Results for orders placed or performed during the hospital encounter of 04/27/20  Hemoglobin A1c  Result Value Ref Range   Hgb A1c MFr Bld 5.2 4.8 - 5.6 %   Mean Plasma Glucose 102.54 mg/dL  Comprehensive metabolic panel  Result Value Ref Range    Sodium 139 135 - 145 mmol/L   Potassium 4.1 3.5 - 5.1 mmol/L   Chloride 106 98 - 111 mmol/L   CO2 26 22 - 32 mmol/L   Glucose, Bld 111 (H) 70 - 99 mg/dL   BUN 17 8 - 23 mg/dL   Creatinine, Ser 1.29 (H) 0.61 - 1.24 mg/dL   Calcium 8.8 (L) 8.9 - 10.3 mg/dL   Total Protein 6.3 (L) 6.5 - 8.1 g/dL   Albumin 3.1 (L) 3.5 - 5.0 g/dL   AST 20 15 - 41 U/L   ALT 15 0 - 44 U/L   Alkaline Phosphatase 115 38 - 126 U/L   Total Bilirubin 0.9 0.3 - 1.2 mg/dL   GFR, Estimated >60 >60 mL/min   Anion gap 7 5 - 15  Lipid panel  Result Value Ref Range   Cholesterol 194 0 - 200 mg/dL   Triglycerides 228 (H) <150 mg/dL   HDL 46 >40 mg/dL   Total CHOL/HDL Ratio 4.2 RATIO   VLDL 46 (H) 0 - 40 mg/dL   LDL Cholesterol 102 (H) 0 - 99 mg/dL      Assessment & Plan:    Encounter Diagnoses  Name Primary?  . Essential hypertension Yes  . Hyperlipidemia, unspecified hyperlipidemia type   . Diet-controlled diabetes mellitus ([place])     -Reviewed labs with pt -will Discontinue glipizide.  Believe that better food choices have improved his glycemic control.  Pt admits to lots of sugary snacks in the past -he is to Get back on lisinopril -he is toTake metoprolol bid as prescribed -Pt to call office Monday to let us know his bp.  He has a machine at home. -renal function improved.  He is to continue to drink plenty of water (but not overload) and limit added salt -he is encouraged to increase the protein in his diet and it given reading information on this subject -pt to follow up 3 months.  He is to contact office sooner prn

## 2020-04-28 NOTE — Patient Instructions (Addendum)
Call office Monday with blood pressure       Protein Content in Foods  Generally, most healthy people need around 50 grams of protein each day. Depending on your overall health, you may need more or less protein in your diet. Talk to your health care provider or dietitian about how much protein you need. See the following list for the protein content of some common foods. High-protein foods High-protein foods contain 4 grams (4 g) or more of protein per serving. They include:  Beef, ground sirloin (cooked) -- 3 oz have 24 g of protein.  Cheese (hard) -- 1 oz has 7 g of protein.  Chicken breast, boneless and skinless (cooked) -- 3 oz have 13.4 g of protein.  Cottage cheese -- 1/2 cup has 13.4 g of protein.  Egg -- 1 egg has 6 g of protein.  Fish, filet (cooked) -- 1 oz has 6-7 g of protein.  Garbanzo beans (canned or cooked) -- 1/2 cup has 6-7 g of protein.  Kidney beans (canned or cooked) -- 1/2 cup has 6-7 g of protein.  Lamb (cooked) -- 3 oz has 24 g of protein.  Milk -- 1 cup (8 oz) has 8 g of protein.  Nuts (peanuts, pistachios, almonds) -- 1 oz has 6 g of protein.  Peanut butter -- 1 oz has 7-8 g of protein.  Pork tenderloin (cooked) -- 3 oz has 18.4 g of protein.  Pumpkin seeds -- 1 oz has 8.5 g of protein.  Soybeans (roasted) -- 1 oz has 8 g of protein.  Soybeans (cooked) -- 1/2 cup has 11 g of protein.  Soy milk -- 1 cup (8 oz) has 5-10 g of protein.  Soy or vegetable patty -- 1 patty has 11 g of protein.  Sunflower seeds -- 1 oz has 5.5 g of protein.  Tofu (firm) -- 1/2 cup has 20 g of protein.  Tuna (canned in water) -- 3 oz has 20 g of protein.  Yogurt -- 6 oz has 8 g of protein. Low-protein foods Low-protein foods contain 3 grams (3 g) or less of protein per serving. They include:  Beets (raw or cooked) -- 1/2 cup has 1.5 g of protein.  Bran cereal -- 1/2 cup has 2-3 g of protein.  Bread -- 1 slice has 2.5 g of protein.  Broccoli (raw or  cooked) -- 1/2 cup has 2 g of protein.  Collard greens (raw or cooked) -- 1/2 cup has 2 g of protein.  Corn (fresh or cooked) -- 1/2 cup has 2 g of protein.  Cream cheese -- 1 oz has 2 g of protein.  Creamer (half-and-half) -- 1 oz has 1 g of protein.  Flour tortilla -- 1 tortilla has 2.5 g of protein  Frozen yogurt -- 1/2 cup has 3 g of protein.  Fruit or vegetable juice -- 1/2 cup has 1 g of protein.  Green beans (raw or cooked) -- 1/2 cup has 1 g of protein.  Green peas (canned) -- 1/2 cup has 3.5 g of protein.  Muffins -- 1 small muffin (2 oz) has 3 g of protein.  Oatmeal (cooked) -- 1/2 cup has 3 g of protein.  Potato (baked with skin) -- 1 medium potato has 3 g of protein.  Rice (cooked) -- 1/2 cup has 2.5-3.5 g of protein.  Sour cream -- 1/2 cup has 2.5 g of protein.  Spinach (cooked) -- 1/2 cup has 3 g of protein.  Squash (cooked) --  1/2 cup has 1.5 g of protein. Actual amounts of protein may be different depending on processing. Talk with your health care provider or dietitian about what foods are recommended for you. This information is not intended to replace advice given to you by your health care provider. Make sure you discuss any questions you have with your health care provider. Document Revised: 02/08/2016 Document Reviewed: 02/08/2016 Elsevier Patient Education  2020 [employer].

## 2020-05-19 ENCOUNTER — Ambulatory Visit: Payer: Self-pay | Admitting: Podiatry

## 2020-06-16 ENCOUNTER — Other Ambulatory Visit: Payer: Self-pay | Admitting: Physician Assistant

## 2020-06-16 MED ORDER — AMLODIPINE BESYLATE 5 MG PO TABS
ORAL_TABLET | ORAL | 0 refills | Status: DC
Start: 2020-06-16 — End: 2020-09-10

## 2020-06-16 MED ORDER — GABAPENTIN 100 MG PO CAPS: 100.0000 mg | ORAL_CAPSULE | Freq: Two times a day (BID) | ORAL | 0 refills | Status: DC

## 2020-06-16 MED ORDER — METOPROLOL TARTRATE 50 MG PO TABS: 50.0000 mg | ORAL_TABLET | Freq: Two times a day (BID) | ORAL | 0 refills | Status: AC

## 2020-06-16 MED ORDER — ATORVASTATIN CALCIUM 20 MG PO TABS
ORAL_TABLET | ORAL | 0 refills | Status: DC
Start: 2020-06-16 — End: 2020-11-27

## 2020-06-16 MED ORDER — LISINOPRIL 20 MG PO TABS
ORAL_TABLET | ORAL | 0 refills | Status: DC
Start: 2020-06-16 — End: 2020-09-10

## 2020-07-17 DIAGNOSIS — R197 Diarrhea, unspecified: Secondary | ICD-10-CM | POA: Diagnosis not present

## 2020-07-17 DIAGNOSIS — R609 Edema, unspecified: Secondary | ICD-10-CM | POA: Diagnosis not present

## 2020-07-17 DIAGNOSIS — E1169 Type 2 diabetes mellitus with other specified complication: Secondary | ICD-10-CM | POA: Diagnosis not present

## 2020-07-17 DIAGNOSIS — I10 Essential (primary) hypertension: Secondary | ICD-10-CM | POA: Diagnosis not present

## 2020-07-17 DIAGNOSIS — R946 Abnormal results of thyroid function studies: Secondary | ICD-10-CM | POA: Diagnosis not present

## 2020-07-17 DIAGNOSIS — E785 Hyperlipidemia, unspecified: Secondary | ICD-10-CM | POA: Diagnosis not present

## 2020-08-04 ENCOUNTER — Ambulatory Visit: Payer: Self-pay | Admitting: Physician Assistant

## 2020-08-07 DIAGNOSIS — J988 Other specified respiratory disorders: Secondary | ICD-10-CM | POA: Diagnosis not present

## 2020-08-07 DIAGNOSIS — R059 Cough, unspecified: Secondary | ICD-10-CM | POA: Diagnosis not present

## 2020-08-10 DIAGNOSIS — R319 Hematuria, unspecified: Secondary | ICD-10-CM | POA: Diagnosis not present

## 2020-08-10 DIAGNOSIS — N1832 Chronic kidney disease, stage 3b: Secondary | ICD-10-CM | POA: Diagnosis not present

## 2020-08-10 DIAGNOSIS — R601 Generalized edema: Secondary | ICD-10-CM | POA: Diagnosis not present

## 2020-08-10 DIAGNOSIS — N049 Nephrotic syndrome with unspecified morphologic changes: Secondary | ICD-10-CM | POA: Diagnosis not present

## 2020-08-12 ENCOUNTER — Other Ambulatory Visit (HOSPITAL_COMMUNITY): Payer: Self-pay | Admitting: Nephrology

## 2020-08-12 ENCOUNTER — Telehealth (HOSPITAL_COMMUNITY): Payer: Self-pay

## 2020-08-12 ENCOUNTER — Other Ambulatory Visit: Payer: Self-pay | Admitting: Nephrology

## 2020-08-12 DIAGNOSIS — N049 Nephrotic syndrome with unspecified morphologic changes: Secondary | ICD-10-CM

## 2020-08-13 ENCOUNTER — Telehealth (HOSPITAL_COMMUNITY): Payer: Self-pay

## 2020-08-17 DIAGNOSIS — I420 Dilated cardiomyopathy: Secondary | ICD-10-CM | POA: Insufficient documentation

## 2020-08-18 ENCOUNTER — Other Ambulatory Visit (HOSPITAL_COMMUNITY): Payer: Self-pay | Admitting: Nephrology

## 2020-08-18 DIAGNOSIS — N049 Nephrotic syndrome with unspecified morphologic changes: Secondary | ICD-10-CM

## 2020-08-21 ENCOUNTER — Ambulatory Visit
Admission: RE | Admit: 2020-08-21 | Discharge: 2020-08-21 | Disposition: A | Discharge status: Home / Self Care | Payer: Medicare HMO | Source: Ambulatory Visit | Attending: Nephrology | Admitting: Nephrology

## 2020-08-21 DIAGNOSIS — R600 Localized edema: Secondary | ICD-10-CM | POA: Diagnosis not present

## 2020-08-21 DIAGNOSIS — N049 Nephrotic syndrome with unspecified morphologic changes: Secondary | ICD-10-CM

## 2020-08-21 DIAGNOSIS — Z87448 Personal history of other diseases of urinary system: Secondary | ICD-10-CM | POA: Diagnosis not present

## 2020-08-24 ENCOUNTER — Ambulatory Visit: Payer: Self-pay | Admitting: Registered"

## 2020-08-24 DIAGNOSIS — R059 Cough, unspecified: Secondary | ICD-10-CM | POA: Diagnosis not present

## 2020-08-24 DIAGNOSIS — R600 Localized edema: Secondary | ICD-10-CM | POA: Diagnosis not present

## 2020-08-26 ENCOUNTER — Other Ambulatory Visit: Payer: Self-pay | Admitting: Physician Assistant

## 2020-08-28 ENCOUNTER — Inpatient Hospital Stay (HOSPITAL_COMMUNITY)
Admission: EM | Admit: 2020-08-28 | Discharge: 2020-09-10 | DRG: 698 | Disposition: A | Discharge status: Home Health | Payer: Medicare HMO | Attending: Internal Medicine | Admitting: Internal Medicine

## 2020-08-28 ENCOUNTER — Encounter (HOSPITAL_COMMUNITY): Payer: Self-pay | Admitting: *Deleted

## 2020-08-28 ENCOUNTER — Other Ambulatory Visit: Payer: Self-pay

## 2020-08-28 ENCOUNTER — Emergency Department (HOSPITAL_COMMUNITY): Payer: Medicare HMO

## 2020-08-28 DIAGNOSIS — G629 Polyneuropathy, unspecified: Secondary | ICD-10-CM

## 2020-08-28 DIAGNOSIS — D631 Anemia in chronic kidney disease: Secondary | ICD-10-CM | POA: Diagnosis present

## 2020-08-28 DIAGNOSIS — Z8261 Family history of arthritis: Secondary | ICD-10-CM

## 2020-08-28 DIAGNOSIS — E1165 Type 2 diabetes mellitus with hyperglycemia: Secondary | ICD-10-CM | POA: Diagnosis not present

## 2020-08-28 DIAGNOSIS — E1122 Type 2 diabetes mellitus with diabetic chronic kidney disease: Secondary | ICD-10-CM | POA: Diagnosis present

## 2020-08-28 DIAGNOSIS — Z8042 Family history of malignant neoplasm of prostate: Secondary | ICD-10-CM

## 2020-08-28 DIAGNOSIS — E782 Mixed hyperlipidemia: Secondary | ICD-10-CM | POA: Diagnosis not present

## 2020-08-28 DIAGNOSIS — N170 Acute kidney failure with tubular necrosis: Secondary | ICD-10-CM | POA: Diagnosis present

## 2020-08-28 DIAGNOSIS — E785 Hyperlipidemia, unspecified: Secondary | ICD-10-CM | POA: Diagnosis present

## 2020-08-28 DIAGNOSIS — Z9842 Cataract extraction status, left eye: Secondary | ICD-10-CM | POA: Diagnosis not present

## 2020-08-28 DIAGNOSIS — J9811 Atelectasis: Secondary | ICD-10-CM | POA: Diagnosis not present

## 2020-08-28 DIAGNOSIS — E1140 Type 2 diabetes mellitus with diabetic neuropathy, unspecified: Secondary | ICD-10-CM | POA: Diagnosis present

## 2020-08-28 DIAGNOSIS — J90 Pleural effusion, not elsewhere classified: Secondary | ICD-10-CM | POA: Diagnosis not present

## 2020-08-28 DIAGNOSIS — N189 Chronic kidney disease, unspecified: Secondary | ICD-10-CM | POA: Diagnosis present

## 2020-08-28 DIAGNOSIS — R601 Generalized edema: Secondary | ICD-10-CM | POA: Diagnosis present

## 2020-08-28 DIAGNOSIS — D638 Anemia in other chronic diseases classified elsewhere: Secondary | ICD-10-CM | POA: Diagnosis not present

## 2020-08-28 DIAGNOSIS — E1121 Type 2 diabetes mellitus with diabetic nephropathy: Secondary | ICD-10-CM | POA: Diagnosis not present

## 2020-08-28 DIAGNOSIS — I313 Pericardial effusion (noninflammatory): Secondary | ICD-10-CM | POA: Diagnosis present

## 2020-08-28 DIAGNOSIS — I5043 Acute on chronic combined systolic (congestive) and diastolic (congestive) heart failure: Secondary | ICD-10-CM | POA: Diagnosis not present

## 2020-08-28 DIAGNOSIS — K219 Gastro-esophageal reflux disease without esophagitis: Secondary | ICD-10-CM | POA: Diagnosis present

## 2020-08-28 DIAGNOSIS — R040 Epistaxis: Secondary | ICD-10-CM | POA: Diagnosis not present

## 2020-08-28 DIAGNOSIS — E876 Hypokalemia: Secondary | ICD-10-CM | POA: Diagnosis present

## 2020-08-28 DIAGNOSIS — Z87441 Personal history of nephrotic syndrome: Secondary | ICD-10-CM

## 2020-08-28 DIAGNOSIS — Z79899 Other long term (current) drug therapy: Secondary | ICD-10-CM | POA: Diagnosis not present

## 2020-08-28 DIAGNOSIS — Z6830 Body mass index (BMI) 30.0-30.9, adult: Secondary | ICD-10-CM

## 2020-08-28 DIAGNOSIS — N179 Acute kidney failure, unspecified: Secondary | ICD-10-CM | POA: Diagnosis not present

## 2020-08-28 DIAGNOSIS — I130 Hypertensive heart and chronic kidney disease with heart failure and stage 1 through stage 4 chronic kidney disease, or unspecified chronic kidney disease: Secondary | ICD-10-CM | POA: Diagnosis not present

## 2020-08-28 DIAGNOSIS — E871 Hypo-osmolality and hyponatremia: Secondary | ICD-10-CM | POA: Diagnosis not present

## 2020-08-28 DIAGNOSIS — I493 Ventricular premature depolarization: Secondary | ICD-10-CM | POA: Diagnosis present

## 2020-08-28 DIAGNOSIS — Z20822 Contact with and (suspected) exposure to covid-19: Secondary | ICD-10-CM | POA: Diagnosis present

## 2020-08-28 DIAGNOSIS — Z833 Family history of diabetes mellitus: Secondary | ICD-10-CM | POA: Diagnosis not present

## 2020-08-28 DIAGNOSIS — N049 Nephrotic syndrome with unspecified morphologic changes: Secondary | ICD-10-CM

## 2020-08-28 DIAGNOSIS — R001 Bradycardia, unspecified: Secondary | ICD-10-CM | POA: Diagnosis not present

## 2020-08-28 DIAGNOSIS — D649 Anemia, unspecified: Secondary | ICD-10-CM | POA: Diagnosis not present

## 2020-08-28 DIAGNOSIS — R0602 Shortness of breath: Secondary | ICD-10-CM | POA: Diagnosis not present

## 2020-08-28 DIAGNOSIS — E1169 Type 2 diabetes mellitus with other specified complication: Secondary | ICD-10-CM | POA: Diagnosis present

## 2020-08-28 DIAGNOSIS — R3129 Other microscopic hematuria: Secondary | ICD-10-CM | POA: Diagnosis present

## 2020-08-28 DIAGNOSIS — Z8249 Family history of ischemic heart disease and other diseases of the circulatory system: Secondary | ICD-10-CM

## 2020-08-28 DIAGNOSIS — Z9841 Cataract extraction status, right eye: Secondary | ICD-10-CM

## 2020-08-28 DIAGNOSIS — J9601 Acute respiratory failure with hypoxia: Secondary | ICD-10-CM | POA: Diagnosis not present

## 2020-08-28 DIAGNOSIS — I10 Essential (primary) hypertension: Secondary | ICD-10-CM | POA: Diagnosis present

## 2020-08-28 DIAGNOSIS — I272 Pulmonary hypertension, unspecified: Secondary | ICD-10-CM | POA: Diagnosis present

## 2020-08-28 DIAGNOSIS — E669 Obesity, unspecified: Secondary | ICD-10-CM | POA: Diagnosis present

## 2020-08-28 DIAGNOSIS — D509 Iron deficiency anemia, unspecified: Secondary | ICD-10-CM | POA: Diagnosis present

## 2020-08-28 DIAGNOSIS — I509 Heart failure, unspecified: Secondary | ICD-10-CM | POA: Diagnosis not present

## 2020-08-28 DIAGNOSIS — R059 Cough, unspecified: Secondary | ICD-10-CM | POA: Diagnosis not present

## 2020-08-28 DIAGNOSIS — E1129 Type 2 diabetes mellitus with other diabetic kidney complication: Secondary | ICD-10-CM | POA: Diagnosis not present

## 2020-08-28 DIAGNOSIS — J811 Chronic pulmonary edema: Secondary | ICD-10-CM | POA: Diagnosis not present

## 2020-08-28 DIAGNOSIS — I517 Cardiomegaly: Secondary | ICD-10-CM | POA: Diagnosis not present

## 2020-08-28 DIAGNOSIS — I340 Nonrheumatic mitral (valve) insufficiency: Secondary | ICD-10-CM | POA: Diagnosis present

## 2020-08-28 HISTORY — DX: Nephrotic syndrome with unspecified morphologic changes: N04.9

## 2020-08-28 HISTORY — DX: Type 2 diabetes mellitus without complications: E11.9

## 2020-08-28 HISTORY — DX: Obesity, unspecified: E66.9

## 2020-08-28 HISTORY — DX: Hyperlipidemia, unspecified: E78.5

## 2020-08-28 HISTORY — DX: Essential (primary) hypertension: I10

## 2020-08-28 LAB — URINALYSIS, ROUTINE W REFLEX MICROSCOPIC
Bilirubin Urine: NEGATIVE
Glucose, UA: 50 mg/dL — AB
Ketones, ur: NEGATIVE mg/dL
Leukocytes,Ua: NEGATIVE
Nitrite: NEGATIVE
Protein, ur: >=300 mg/dL — AB
Specific Gravity, Urine: 1.014 (ref 1.005–1.030)
pH: 5 (ref 5.0–8.0)

## 2020-08-28 LAB — CBC
HCT: 31.2 % — ABNORMAL LOW (ref 39.0–52.0)
HCT: 27.8 % — ABNORMAL LOW (ref 39.0–52.0)
Hemoglobin: 9.7 g/dL — ABNORMAL LOW (ref 13.0–17.0)
Hemoglobin: 8.7 g/dL — ABNORMAL LOW (ref 13.0–17.0)
MCH: 29 pg (ref 26.0–34.0)
MCH: 29 pg (ref 26.0–34.0)
MCHC: 31.1 g/dL (ref 30.0–36.0)
MCHC: 31.3 g/dL (ref 30.0–36.0)
MCV: 93.1 fL (ref 80.0–100.0)
MCV: 92.7 fL (ref 80.0–100.0)
Platelets: 334 10*3/uL (ref 150–400)
Platelets: 287 10*3/uL (ref 150–400)
RBC: 3.35 MIL/uL — ABNORMAL LOW (ref 4.22–5.81)
RBC: 3 MIL/uL — ABNORMAL LOW (ref 4.22–5.81)
RDW: 13.6 % (ref 11.5–15.5)
RDW: 13.4 % (ref 11.5–15.5)
WBC: 6.8 10*3/uL (ref 4.0–10.5)
WBC: 7.2 10*3/uL (ref 4.0–10.5)
nRBC: 0 % (ref 0.0–0.2)
nRBC: 0 % (ref 0.0–0.2)

## 2020-08-28 LAB — HEPATIC FUNCTION PANEL
ALT: 20 U/L (ref 0–44)
AST: 22 U/L (ref 15–41)
Albumin: 2.4 g/dL — ABNORMAL LOW (ref 3.5–5.0)
Alkaline Phosphatase: 112 U/L (ref 38–126)
Bilirubin, Direct: 0.1 mg/dL (ref 0.0–0.2)
Indirect Bilirubin: 0.8 mg/dL (ref 0.3–0.9)
Total Bilirubin: 0.9 mg/dL (ref 0.3–1.2)
Total Protein: 5.2 g/dL — ABNORMAL LOW (ref 6.5–8.1)

## 2020-08-28 LAB — CREATININE, SERUM
Creatinine, Ser: 2.36 mg/dL — ABNORMAL HIGH (ref 0.61–1.24)
GFR, Estimated: 31 mL/min — ABNORMAL LOW (ref >60)

## 2020-08-28 LAB — IRON AND TIBC
Iron: 35 ug/dL — ABNORMAL LOW (ref 45–182)
Saturation Ratios: 12 % — ABNORMAL LOW (ref 17.9–39.5)
TIBC: 287 ug/dL (ref 250–450)
UIBC: 252 ug/dL

## 2020-08-28 LAB — HIV ANTIBODY (ROUTINE TESTING W REFLEX): HIV Screen 4th Generation wRfx: NONREACTIVE

## 2020-08-28 LAB — SARS CORONAVIRUS 2 (TAT 6-24 HRS): SARS Coronavirus 2: NEGATIVE

## 2020-08-28 LAB — BASIC METABOLIC PANEL
Anion gap: 6 (ref 5–15)
BUN: 39 mg/dL — ABNORMAL HIGH (ref 8–23)
CO2: 26 mmol/L (ref 22–32)
Calcium: 8.3 mg/dL — ABNORMAL LOW (ref 8.9–10.3)
Chloride: 105 mmol/L (ref 98–111)
Creatinine, Ser: 2.43 mg/dL — ABNORMAL HIGH (ref 0.61–1.24)
GFR, Estimated: 30 mL/min — ABNORMAL LOW (ref >60)
Glucose, Bld: 113 mg/dL — ABNORMAL HIGH (ref 70–99)
Potassium: 3.7 mmol/L (ref 3.5–5.1)
Sodium: 137 mmol/L (ref 135–145)

## 2020-08-28 LAB — GLUCOSE, CAPILLARY
Glucose-Capillary: 72 mg/dL (ref 70–99)
Glucose-Capillary: 100 mg/dL — ABNORMAL HIGH (ref 70–99)

## 2020-08-28 LAB — HEMOGLOBIN A1C
Hgb A1c MFr Bld: 5.3 % (ref 4.8–5.6)
Mean Plasma Glucose: 105.41 mg/dL

## 2020-08-28 LAB — TROPONIN I (HIGH SENSITIVITY)
Troponin I (High Sensitivity): 33 ng/L — ABNORMAL HIGH (ref <18)
Troponin I (High Sensitivity): 33 ng/L — ABNORMAL HIGH (ref <18)

## 2020-08-28 LAB — PROTEIN / CREATININE RATIO, URINE
Creatinine, Urine: 106.9 mg/dL
Protein Creatinine Ratio: 6.82 mg/mg{Cre} — ABNORMAL HIGH (ref 0.00–0.15)
Total Protein, Urine: 729 mg/dL

## 2020-08-28 LAB — FERRITIN: Ferritin: 61 ng/mL (ref 24–336)

## 2020-08-28 LAB — BRAIN NATRIURETIC PEPTIDE: B Natriuretic Peptide: 2878 pg/mL — ABNORMAL HIGH (ref 0.0–100.0)

## 2020-08-28 LAB — FOLATE: Folate: 6.7 ng/mL (ref >5.9)

## 2020-08-28 LAB — VITAMIN B12: Vitamin B-12: 318 pg/mL (ref 180–914)

## 2020-08-28 MED ORDER — INSULIN ASPART 100 UNIT/ML ~~LOC~~ SOLN
0.0000 [IU] | Freq: Three times a day (TID) | SUBCUTANEOUS | Status: DC
  Administered 2020-08-29: 1 [IU] via SUBCUTANEOUS
  Administered 2020-09-01: 2 [IU] via SUBCUTANEOUS
  Administered 2020-09-02 – 2020-09-04 (×2): 1 [IU] via SUBCUTANEOUS
  Administered 2020-09-05 – 2020-09-06 (×2): 2 [IU] via SUBCUTANEOUS
  Administered 2020-09-07 (×2): 1 [IU] via SUBCUTANEOUS
  Administered 2020-09-07: 2 [IU] via SUBCUTANEOUS
  Administered 2020-09-08 – 2020-09-09 (×2): 1 [IU] via SUBCUTANEOUS

## 2020-08-28 MED ORDER — METOPROLOL TARTRATE 50 MG PO TABS
50.0000 mg | ORAL_TABLET | Freq: Two times a day (BID) | ORAL | Status: DC
  Administered 2020-08-28 – 2020-09-10 (×26): 50 mg via ORAL
  Filled 2020-08-28 (×27): qty 1

## 2020-08-28 MED ORDER — LORATADINE 10 MG PO TABS
10.0000 mg | ORAL_TABLET | Freq: Every day | ORAL | Status: DC
  Administered 2020-08-29 – 2020-09-03 (×6): 10 mg via ORAL
  Filled 2020-08-28 (×6): qty 1

## 2020-08-28 MED ORDER — GABAPENTIN 100 MG PO CAPS
100.0000 mg | ORAL_CAPSULE | Freq: Every day | ORAL | Status: DC
  Administered 2020-08-29 – 2020-09-10 (×13): 100 mg via ORAL
  Filled 2020-08-28 (×13): qty 1

## 2020-08-28 MED ORDER — ACETAMINOPHEN 325 MG PO TABS
650.0000 mg | ORAL_TABLET | Freq: Four times a day (QID) | ORAL | Status: DC | PRN
  Administered 2020-09-04: 650 mg via ORAL
  Filled 2020-08-28 (×2): qty 2

## 2020-08-28 MED ORDER — ACETAMINOPHEN 650 MG RE SUPP: 650.0000 mg | Freq: Four times a day (QID) | RECTAL | Status: DC | PRN

## 2020-08-28 MED ORDER — SODIUM CHLORIDE 0.9% FLUSH
3.0000 mL | INTRAVENOUS | Status: DC | PRN
  Administered 2020-08-30: 3 mL via INTRAVENOUS

## 2020-08-28 MED ORDER — ALBUMIN HUMAN 25 % IV SOLN
25.0000 g | Freq: Four times a day (QID) | INTRAVENOUS | Status: AC
  Administered 2020-08-28 – 2020-08-29 (×4): 25 g via INTRAVENOUS
  Filled 2020-08-28 (×4): qty 100

## 2020-08-28 MED ORDER — HEPARIN SODIUM (PORCINE) 5000 UNIT/ML IJ SOLN
5000.0000 [IU] | Freq: Three times a day (TID) | INTRAMUSCULAR | Status: DC
  Administered 2020-08-28 – 2020-08-30 (×6): 5000 [IU] via SUBCUTANEOUS
  Filled 2020-08-28 (×8): qty 1

## 2020-08-28 MED ORDER — ATORVASTATIN CALCIUM 10 MG PO TABS
10.0000 mg | ORAL_TABLET | Freq: Every day | ORAL | Status: DC
  Administered 2020-08-29 – 2020-09-03 (×6): 10 mg via ORAL
  Filled 2020-08-28 (×6): qty 1

## 2020-08-28 MED ORDER — FISH OIL 1200 MG PO CAPS: 1.0000 | ORAL_CAPSULE | Freq: Every day | ORAL | Status: DC

## 2020-08-28 MED ORDER — FUROSEMIDE 10 MG/ML IJ SOLN: 60.0000 mg | Freq: Once | INTRAMUSCULAR | Status: DC

## 2020-08-28 MED ORDER — SODIUM CHLORIDE 0.9 % IV SOLN: 250.0000 mL | INTRAVENOUS | Status: DC | PRN

## 2020-08-28 MED ORDER — OMEGA-3-ACID ETHYL ESTERS 1 G PO CAPS
1.0000 g | ORAL_CAPSULE | Freq: Every day | ORAL | Status: DC
  Administered 2020-08-28 – 2020-09-01 (×5): 1 g via ORAL
  Filled 2020-08-28 (×7): qty 1

## 2020-08-28 MED ORDER — AMLODIPINE BESYLATE 5 MG PO TABS
5.0000 mg | ORAL_TABLET | Freq: Every day | ORAL | Status: DC
  Administered 2020-08-29 – 2020-08-30 (×2): 5 mg via ORAL
  Filled 2020-08-28 (×2): qty 1

## 2020-08-28 MED ORDER — BENZONATATE 100 MG PO CAPS
100.0000 mg | ORAL_CAPSULE | Freq: Three times a day (TID) | ORAL | Status: DC | PRN
  Administered 2020-08-29 – 2020-09-02 (×4): 100 mg via ORAL
  Filled 2020-08-28 (×4): qty 1

## 2020-08-28 MED ORDER — TRAZODONE HCL 50 MG PO TABS
25.0000 mg | ORAL_TABLET | Freq: Every evening | ORAL | Status: DC | PRN
  Administered 2020-08-29 – 2020-09-09 (×9): 25 mg via ORAL
  Filled 2020-08-28 (×7): qty 1
  Filled 2020-08-28: qty 0.5
  Filled 2020-08-28: qty 1

## 2020-08-28 MED ORDER — FUROSEMIDE 10 MG/ML IJ SOLN: 80.0000 mg | Freq: Two times a day (BID) | INTRAMUSCULAR | Status: DC

## 2020-08-28 MED ORDER — FAMOTIDINE 20 MG PO TABS
20.0000 mg | ORAL_TABLET | Freq: Two times a day (BID) | ORAL | Status: DC | PRN
  Administered 2020-08-29 – 2020-09-01 (×2): 20 mg via ORAL
  Filled 2020-08-28 (×2): qty 1

## 2020-08-28 MED ORDER — POLYETHYLENE GLYCOL 3350 17 G PO PACK: 17.0000 g | PACK | Freq: Every day | ORAL | Status: DC | PRN

## 2020-08-28 MED ORDER — FUROSEMIDE 10 MG/ML IJ SOLN
80.0000 mg | Freq: Two times a day (BID) | INTRAMUSCULAR | Status: DC
  Administered 2020-08-28 – 2020-08-29 (×2): 80 mg via INTRAVENOUS
  Filled 2020-08-28 (×2): qty 8

## 2020-08-28 MED ORDER — SODIUM CHLORIDE 0.9% FLUSH
3.0000 mL | Freq: Two times a day (BID) | INTRAVENOUS | Status: DC
  Administered 2020-08-28 – 2020-09-10 (×24): 3 mL via INTRAVENOUS

## 2020-08-28 NOTE — ED Provider Notes (Signed)
[facility] EMERGENCY DEPARTMENT Provider Note   CSN: [id number] Arrival date & time: 08/28/20  [id number]     History Chief Complaint  Patient presents with  . Edema    [name] [name] is a 60 y.o. male.  HPI Patient presents with diffuse edema. He has a history of diabetes, kidney disease, was previously on a single diuretic daily.  Now over the past 2 weeks or so he has developed diffuse anasarca with social discomfort, but without substantial increase in dyspnea.  No specific pain, symptoms described as more uncomfortable. No fever Patient's symptoms have progressed in spite of taking 3 diuretics daily.    Past Medical History:  Diagnosis Date  . Arthritis   . Diabetes mellitus without complication ([place]) [id number]  . GERD (gastroesophageal reflux disease)     Patient Active Problem List   Diagnosis Date Noted  . Anasarca 08/28/2020  . Neuropathy 07/12/2018  . Osteomyelitis of great toe of right foot ([place]) 07/12/2018  . Essential hypertension 02/14/2018  . Uncontrolled type 2 diabetes mellitus with hyperglycemia ([place]) 01/08/2018  . Hyperlipidemia 01/08/2018    Past Surgical History:  Procedure Laterality Date  . CATARACT EXTRACTION, BILATERAL  2018  . TONSILLECTOMY         Family History  Problem Relation Age of Onset  . Hypertension Mother   . Diabetes Mother   . Cancer Father        prostate cancer  . Hypertension Maternal Grandfather   . Rheum arthritis Maternal Grandmother     Social History   Tobacco Use  . Smoking status: Never Smoker  . Smokeless tobacco: Never Used  Vaping Use  . Vaping Use: Never used  Substance Use Topics  . Alcohol use: Never  . Drug use: Never    Home Medications Prior to Admission medications   Medication Sig Start Date End Date Taking? Authorizing Provider  acetaminophen (TYLENOL) 500 MG tablet Take 500 mg by mouth every 6 (six) hours as needed (arthritis).     [provider]  amLODipine  (NORVASC) 5 MG tablet TAKE 1 Tablet BY MOUTH ONCE EVERY DAY 06/16/20   Soyla Dryer, PA-C  atorvastatin (LIPITOR) 20 MG tablet TAKE 1 Tablet BY MOUTH ONCE EVERY DAY 06/16/20   Soyla Dryer, PA-C  Calcium Carbonate-Simethicone (ALKA-SELTZER HEARTBURN + GAS) 750-80 MG CHEW Chew 1 tablet by mouth daily as needed.    [provider]  famotidine (PEPCID) 20 MG tablet Take 1 tablet (20 mg total) by mouth 2 (two) times daily as needed for heartburn or indigestion. 01/08/18   Soyla Dryer, PA-C  gabapentin (NEURONTIN) 100 MG capsule Take 1 capsule (100 mg total) by mouth 2 (two) times daily. 06/16/20   Soyla Dryer, PA-C  lisinopril (ZESTRIL) 20 MG tablet TAKE 1 Tablet BY MOUTH ONCE EVERY DAY 06/16/20   Soyla Dryer, PA-C  metoprolol tartrate (LOPRESSOR) 50 MG tablet Take 1 tablet (50 mg total) by mouth 2 (two) times daily. 06/16/20   Soyla Dryer, PA-C  Omega-3 Fatty Acids (FISH OIL) 1200 MG CAPS Take 1 capsule (1,200 mg total) by mouth 2 (two) times daily. 01/08/18   Soyla Dryer, PA-C  prednisoLONE acetate (PRED FORTE) 1 % ophthalmic suspension 1 drop 4 (four) times daily. 01/13/20   [provider]    Allergies    Patient has no known allergies.  Review of Systems   Review of Systems  Constitutional:       Per HPI, otherwise negative  HENT:  Per HPI, otherwise negative  Respiratory:       Per HPI, otherwise negative  Cardiovascular:       Per HPI, otherwise negative  Gastrointestinal: Negative for vomiting.  Endocrine:       Negative aside from HPI  Genitourinary:       Neg aside from HPI   Musculoskeletal:       Per HPI, otherwise negative  Skin: Negative.   Neurological: Negative for syncope.    Physical Exam Updated Vital Signs BP (!) 161/79   Pulse (!) 55   Temp 98.3 F (36.8 C)   Resp 18   SpO2 93%   Physical Exam Vitals and nursing note reviewed.  Constitutional:      General: He is not in acute distress.    Appearance: He is  well-developed.  HENT:     Head: Normocephalic and atraumatic.  Eyes:     Conjunctiva/sclera: Conjunctivae normal.  Cardiovascular:     Rate and Rhythm: Normal rate and regular rhythm.  Pulmonary:     Effort: Pulmonary effort is normal. No respiratory distress.     Breath sounds: No stridor.  Abdominal:     General: There is no distension.  Musculoskeletal:     Comments: Anasarca everywhere  Skin:    General: Skin is warm and dry.  Neurological:     Mental Status: He is alert and oriented to person, place, and time.      ED Results / Procedures / Treatments   Labs (all labs ordered are listed, but only abnormal results are displayed) Labs Reviewed  BASIC METABOLIC PANEL - Abnormal; Notable for the following components:      Result Value   Glucose, Bld 113 (*)    BUN 39 (*)    Creatinine, Ser 2.43 (*)    Calcium 8.3 (*)    GFR, Estimated 30 (*)    All other components within normal limits  CBC - Abnormal; Notable for the following components:   RBC 3.35 (*)    Hemoglobin 9.7 (*)    HCT 31.2 (*)    All other components within normal limits  BRAIN NATRIURETIC PEPTIDE - Abnormal; Notable for the following components:   B Natriuretic Peptide 2,878.0 (*)    All other components within normal limits  HEPATIC FUNCTION PANEL - Abnormal; Notable for the following components:   Total Protein 5.2 (*)    Albumin 2.4 (*)    All other components within normal limits  TROPONIN I (HIGH SENSITIVITY) - Abnormal; Notable for the following components:   Troponin I (High Sensitivity) 33 (*)    All other components within normal limits  TROPONIN I (HIGH SENSITIVITY) - Abnormal; Notable for the following components:   Troponin I (High Sensitivity) 33 (*)    All other components within normal limits  SARS CORONAVIRUS 2 (TAT 6-24 HRS)  URINALYSIS, ROUTINE W REFLEX MICROSCOPIC  PROTEIN / CREATININE RATIO, URINE    EKG EKG Interpretation  Date/Time:  Friday August 28 2020 09:57:58  EDT Ventricular Rate:  58 PR Interval:  158 QRS Duration: 82 QT Interval:  472 QTC Calculation: 463 R Axis:   33 Text Interpretation: Sinus bradycardia with occasional Premature ventricular complexes Otherwise normal ECG Confirmed by Carmin Muskrat [phone]) on 08/28/2020 12:02:09 PM   Radiology DG Chest 2 View  Result Date: 08/28/2020 CLINICAL DATA:  Edema and shortness of breath. EXAM: CHEST - 2 VIEW COMPARISON:  None. FINDINGS: Cardiac enlargement. Small left greater than right pleural effusions. Left  greater than right basilar interstitial and airspace opacities. Degenerative change of the shoulders and thoracic spine. IMPRESSION: Cardiac enlargement with small left greater than right pleural effusions and bibasilar interstitial and airspace opacities, likely edema. Electronically Signed   By: Dahlia Bailiff MD   On: 08/28/2020 11:14    Procedures Procedures   Medications Ordered in ED Medications - No data to display  ED Course  I have reviewed the triage vital signs and the nursing notes.  Pertinent labs & imaging results that were available during my care of the patient were reviewed by me and considered in my medical decision making (see chart for details).    2:16 PM Patient accompanied by his mother. We again discussed today's presentation. She notes again that the patient is scheduled for renal biopsy at some point, though she is unclear of working diagnosis. With the patient's noted history of renal dysfunction, though of unclear etiology, and his cognitive disability, there are some suspicion for a syndrome. Here patient is found to have worsening renal function/AKI, as well as substantial elevated BNP, 2800. Patient also has mildly elevated troponin, though without chest pain, with a nonischemic EKG this likely reflects demand ischemia rather than atypical ACS. Given this combination of findings, anasarca, AKI, patient will require admission for appropriate ongoing  interventions, management. I discussed this case with our internal medicine colleague prior to admission. MDM Rules/Calculators/A&P MDM Number of Diagnoses or Management Options Anasarca: new, needed workup   Amount and/or Complexity of Data Reviewed Clinical lab tests: reviewed Tests in the radiology section of CPT: reviewed Tests in the medicine section of CPT: reviewed Decide to obtain previous medical records or to obtain history from someone other than the patient: yes Obtain history from someone other than the patient: yes Review and summarize past medical records: yes Discuss the patient with other providers: yes Independent visualization of images, tracings, or specimens: yes  Risk of Complications, Morbidity, and/or Mortality Presenting problems: high Diagnostic procedures: high Management options: high  Critical Care Total time providing critical care: < 30 minutes  Patient Progress Patient progress: stable  Final Clinical Impression(s) / ED Diagnoses Final diagnoses:  Anasarca     Carmin Muskrat, MD 08/28/20 1418

## 2020-08-28 NOTE — Consult Note (Addendum)
[facility] Nephrology Consultation Note  Requesting MD: Dr Jamse Arn Reason for consult: Anasarca  HPI:  [name] [name] is a 60 y.o. male with history of hypertension, HLD, diabetes, recently seen at [facility] for nephrotic syndrome presents with worsening lower extremity edema/anasarca, seen as a consultation for the management of fluid overload and AKI. Patient has history of longstanding diabetes with A1c used to be around 12.9 however improved to 5.2 recently.  He was initially referred to nephrology by his PCP for proteinuria.  He was started on Lasix and lisinopril with plan to do kidney biopsy as outpatient.  He is already scheduled for kidney biopsy on 3/22. He follows with Dr. Hollie Salk and was seen on 08/02/2020 when urinalysis with microscopic hematuria, spot urine PCR 10.9 g.  The serologies including ANA, ANCA, C3, C4, hepatitis B, C, HIV, RPR negative.  SPEP with no M spike and kappa lambda ratio was 0.95.  The creatinine level was 1.96 with albumin 3.1.  He is on Lasix 40 mg twice a day and lisinopril 20 mg. He now presents with worsening and some shortness of breath.  He has dyspnea on exertion to the point that he could not walk further. In the ER, the blood pressure was elevated, he was on room air.  The labs showed creatinine level gone up to 2.43, serum albumin 2.4, BNP 2878, hemoglobin 9.7. Lisinopril and Lasix was held by the primary team and we are consulted to manage nephrotic syndrome. Patient is poor historian.  He reports not feeling well and worsening swelling.  Denies nausea, vomiting, chest pain, headache or dizziness.  Creatinine, Ser  Date/Time Value Ref Range Status  08/28/2020 10:30 AM 2.43 (H) 0.61 - 1.24 mg/dL Final  04/27/2020 11:18 AM 1.29 (H) 0.61 - 1.24 mg/dL Final  01/31/2020 12:16 PM 1.75 (H) 0.61 - 1.24 mg/dL Final  10/18/2019 11:53 AM 1.23 0.61 - 1.24 mg/dL Final  02/19/2019 11:00 AM 0.97 0.61 - 1.24 mg/dL Final    PMHx:   Past  Medical History:  Diagnosis Date  . Arthritis   . Diabetes mellitus without complication ([place]) [id number]  . GERD (gastroesophageal reflux disease)     Past Surgical History:  Procedure Laterality Date  . CATARACT EXTRACTION, BILATERAL  2018  . TONSILLECTOMY      Family Hx:  Family History  Problem Relation Age of Onset  . Hypertension Mother   . Diabetes Mother   . Cancer Father        prostate cancer  . Hypertension Maternal Grandfather   . Rheum arthritis Maternal Grandmother     Social History:  reports that he has never smoked. He has never used smokeless tobacco. He reports that he does not drink alcohol and does not use drugs.  Allergies: No Known Allergies  Medications: Prior to Admission medications   Medication Sig Start Date End Date Taking? Authorizing Provider  acetaminophen (TYLENOL) 500 MG tablet Take 500 mg by mouth every 6 (six) hours as needed (arthritis).    Yes [provider]  amLODipine (NORVASC) 5 MG tablet TAKE 1 Tablet BY MOUTH ONCE EVERY DAY Patient taking differently: Take 5 mg by mouth daily. 06/16/20  Yes Soyla Dryer, PA-C  atorvastatin (LIPITOR) 10 MG tablet Take 10 mg by mouth daily. 08/17/20  Yes [provider]  benzonatate (TESSALON) 100 MG capsule Take 100 mg by mouth 3 (three) times daily as needed for cough. 08/24/20  Yes [provider]  Calcium Carbonate-Simethicone 750-80 MG CHEW Chew 1  tablet by mouth daily as needed (heartburn).   Yes [provider]  famotidine (PEPCID) 20 MG tablet Take 1 tablet (20 mg total) by mouth 2 (two) times daily as needed for heartburn or indigestion. 01/08/18  Yes Soyla Dryer, PA-C  furosemide (LASIX) 40 MG tablet Take 80 mg by mouth daily. 08/10/20  Yes [provider]  gabapentin (NEURONTIN) 100 MG capsule Take 1 capsule (100 mg total) by mouth 2 (two) times daily. Patient taking differently: Take 100 mg by mouth daily. 06/16/20  Yes Soyla Dryer, PA-C   glipiZIDE (GLUCOTROL) 5 MG tablet Take 5 mg by mouth daily. 08/26/20  Yes [provider]  lisinopril (ZESTRIL) 20 MG tablet TAKE 1 Tablet BY MOUTH ONCE EVERY DAY Patient taking differently: Take 20 mg by mouth at bedtime. 06/16/20  Yes Soyla Dryer, PA-C  loratadine (CLARITIN) 10 MG tablet Take 10 mg by mouth daily. 08/24/20  Yes [provider]  metoprolol tartrate (LOPRESSOR) 50 MG tablet Take 1 tablet (50 mg total) by mouth 2 (two) times daily. 06/16/20  Yes Soyla Dryer, PA-C  Omega-3 Fatty Acids (FISH OIL) 1200 MG CAPS Take 1 capsule (1,200 mg total) by mouth 2 (two) times daily. Patient taking differently: Take 1 capsule by mouth at bedtime. 01/08/18  Yes Soyla Dryer, PA-C  atorvastatin (LIPITOR) 20 MG tablet TAKE 1 Tablet BY MOUTH ONCE EVERY DAY Patient not taking: Reported on 08/28/2020 06/16/20   Soyla Dryer, PA-C    I have reviewed the patient's current medications.  Labs:  Results for orders placed or performed during the hospital encounter of 08/28/20 (from the past 48 hour(s))  Basic metabolic panel     Status: Abnormal   Collection Time: 08/28/20 10:30 AM  Result Value Ref Range   Sodium 137 135 - 145 mmol/L   Potassium 3.7 3.5 - 5.1 mmol/L   Chloride 105 98 - 111 mmol/L   CO2 26 22 - 32 mmol/L   Glucose, Bld 113 (H) 70 - 99 mg/dL    Comment: Glucose reference range applies only to samples taken after fasting for at least 8 hours.   BUN 39 (H) 8 - 23 mg/dL   Creatinine, Ser 2.43 (H) 0.61 - 1.24 mg/dL   Calcium 8.3 (L) 8.9 - 10.3 mg/dL   GFR, Estimated 30 (L) >60 mL/min    Comment: (NOTE) Calculated using the CKD-EPI Creatinine Equation (2021)    Anion gap 6 5 - 15    Comment: Performed at [facility] [address] [place], [place] [address]  CBC     Status: Abnormal   Collection Time: 08/28/20 10:30 AM  Result Value Ref Range   WBC 6.8 4.0 - 10.5 K/uL   RBC 3.35 (L) 4.22 - 5.81 MIL/uL   Hemoglobin 9.7 (L) 13.0 - 17.0 g/dL    HCT 31.2 (L) 39.0 - 52.0 %   MCV 93.1 80.0 - 100.0 fL   MCH 29.0 26.0 - 34.0 pg   MCHC 31.1 30.0 - 36.0 g/dL   RDW 13.6 11.5 - 15.5 %   Platelets 334 150 - 400 K/uL   nRBC 0.0 0.0 - 0.2 %    Comment: Performed at [facility], [place] [address], [place], [place] [address]  Troponin I (High Sensitivity)     Status: Abnormal   Collection Time: 08/28/20 10:30 AM  Result Value Ref Range   Troponin I (High Sensitivity) 33 (H) <18 ng/L    Comment: (NOTE) Elevated high sensitivity troponin I (  hsTnI) values and significant  changes across serial measurements may suggest ACS but many other  chronic and acute conditions are known to elevate hsTnI results.  Refer to the "Links" section for chest pain algorithms and additional  guidance. Performed at [facility], [place] [address], [place], [place] [address]   Troponin I (High Sensitivity)     Status: Abnormal   Collection Time: 08/28/20 12:23 PM  Result Value Ref Range   Troponin I (High Sensitivity) 33 (H) <18 ng/L    Comment: (NOTE) Elevated high sensitivity troponin I (hsTnI) values and significant  changes across serial measurements may suggest ACS but many other  chronic and acute conditions are known to elevate hsTnI results.  Refer to the "Links" section for chest pain algorithms and additional  guidance. Performed at [facility], [place] [address], [place], [place] [address]   Hepatic function panel     Status: Abnormal   Collection Time: 08/28/20 12:23 PM  Result Value Ref Range   Total Protein 5.2 (L) 6.5 - 8.1 g/dL   Albumin 2.4 (L) 3.5 - 5.0 g/dL   AST 22 15 - 41 U/L   ALT 20 0 - 44 U/L   Alkaline Phosphatase 112 38 - 126 U/L   Total Bilirubin 0.9 0.3 - 1.2 mg/dL   Bilirubin, Direct 0.1 0.0 - 0.2 mg/dL   Indirect Bilirubin 0.8 0.3 - 0.9 mg/dL    Comment: Performed at [facility] [address], [place], [place] [address]  Brain natriuretic peptide     Status: Abnormal   Collection Time: 08/28/20  12:42 PM  Result Value Ref Range   B Natriuretic Peptide 2,878.0 (H) 0.0 - 100.0 pg/mL    Comment: Performed at [facility] [address], [place],  [address]     ROS:  Pertinent items noted in HPI and remainder of comprehensive ROS otherwise negative.  Physical Exam: Vitals:   08/28/20 1545 08/28/20 1653  BP: (!) 168/78 (!) 178/89  Pulse: (!) 55 61  Resp: 18 18  Temp:  97.8 F (36.6 C)  SpO2: 94% 95%     General exam: Appears calm and comfortable  Respiratory system: Clear to auscultation. Respiratory effort normal. No wheezing or crackle Cardiovascular system: S1 & S2 heard, RRR.  Bilateral pitting edema/anasarca present Gastrointestinal system: Abdomen wall is edematous, soft, nontender.  Normal bowel sounds heard. Central nervous system: Alert and oriented. No focal neurological deficits. Extremities: Anasarca+, no cyanosis. Skin: No rashes, lesions or ulcers Hematology: No petechiae or ecchymosis noted.  Assessment/Plan:  #Nephrotic syndrome with anasarca: UA with 10.9 g of proteinuria.  He likely has diabetic nephropathy.  However with microscopic hematuria and AKI need to rule out underlying glomerular disease. Extensive serology test done as outpatient unremarkable. Since he is now hospitalized, I will consult IR to do kidney biopsy.  Start IV Lasix with albumin for anasarca. Strict ins and out, monitor lab  #Acute kidney injury: Probably hemodynamically mediated with reduced renal perfusion and ACE inhibitor.  Agree with holding lisinopril.  Diuretics to offload volume and to help renal perfusion.  Avoid nephrotoxins or hypotensive episode.  #Anemia probably due to chronic disease and dilution: Monitor hemoglobin.  #Hypertension: Continue metoprolol, amlodipine and diuretics as above.  Agree with holding lisinopril.  #Uncontrolled type 2 diabetes: On insulin per primary team.  Thank you for the consult.  We will follow with you.  Dron Tanna Furry 08/28/2020, 4:59 PM  [facility].

## 2020-08-28 NOTE — ED Notes (Signed)
Attempted to call nursing report to 27M

## 2020-08-28 NOTE — ED Triage Notes (Signed)
Pt is here for generalized edema which has been increasing for a few weeks.  Pt has visibly swollen hands, arms and legs and pt repots abdominal swelling.  Pt reports that he takes 2 fluid pills three times a day.

## 2020-08-28 NOTE — H&P (Signed)
History and Physical:    [name] [name]   [id number] DOB: [birth year] DOA: 08/28/2020  Referring MD/provider: Dr. Vanita Panda PCP: [name], MD   Patient coming from: Home  Chief Complaint: "Swelling"  History of Present Illness:   [name] [name] is an 60 y.o. male with PMH significant for HTN, DM2, prior history of OM, HL who was seen last week by Dr. Hollie Salk of [facility] for swelling and was diagnosed with nephrotic syndrome.  Patient was started on Lasix 40 mg daily which was up 2 days later to 80 mg daily but appears he was taking it 40 mg twice daily.  He was scheduled for a kidney biopsy on March 22.  Patient now comes in with his mother due to ongoing swelling and feeling worse.  Notes he feels very tired.  Also notes that he is short of breath at rest and is unable to lie down because he gets short of breath.  Patient does feel better when he sits up in bed and is able to breathe better.  Patient also notes that his edema has gotten much worse over the past week even though he has been on the Lasix.  Patient denies any fevers or chills.  No new cough.  No abdominal pain nausea vomiting or diarrhea.  ED Course:  The patient was noted to be afebrile and hypertensive.  He was satting 95% on room air.  Chest x-ray shows some opacifications and pleural effusions consistent with pulmonary edema.  Patient was admitted for management of anasarca/nephrotic syndrome/pulmonary edema  ROS:   ROS   Review of Systems: Per HPI  Past Medical History:   Past Medical History:  Diagnosis Date  . Arthritis   . Diabetes mellitus without complication ([place]) [id number]  . GERD (gastroesophageal reflux disease)     Past Surgical History:   Past Surgical History:  Procedure Laterality Date  . CATARACT EXTRACTION, BILATERAL  2018  . TONSILLECTOMY      Social History:   Social History   Socioeconomic History  . Marital status: Single    Spouse name: Not on file   . Number of children: Not on file  . Years of education: Not on file  . Highest education level: Not on file  Occupational History  . Not on file  Tobacco Use  . Smoking status: Never Smoker  . Smokeless tobacco: Never Used  Vaping Use  . Vaping Use: Never used  Substance and Sexual Activity  . Alcohol use: Never  . Drug use: Never  . Sexual activity: Not on file  Other Topics Concern  . Not on file  Social History Narrative  . Not on file   Social Determinants of Health   Financial Resource Strain: Not on file  Food Insecurity: Not on file  Transportation Needs: Not on file  Physical Activity: Not on file  Stress: Not on file  Social Connections: Not on file  Intimate Partner Violence: Not on file    Allergies   Patient has no known allergies.  Family history:   Family History  Problem Relation Age of Onset  . Hypertension Mother   . Diabetes Mother   . Cancer Father        prostate cancer  . Hypertension Maternal Grandfather   . Rheum arthritis Maternal Grandmother     Current Medications:   Prior to Admission medications   Medication Sig Start Date End Date Taking? Authorizing Provider  acetaminophen (TYLENOL) 500 MG tablet Take  500 mg by mouth every 6 (six) hours as needed (arthritis).     [provider]  amLODipine (NORVASC) 5 MG tablet TAKE 1 Tablet BY MOUTH ONCE EVERY DAY 06/16/20   Soyla Dryer, PA-C  atorvastatin (LIPITOR) 20 MG tablet TAKE 1 Tablet BY MOUTH ONCE EVERY DAY 06/16/20   Soyla Dryer, PA-C  Calcium Carbonate-Simethicone (ALKA-SELTZER HEARTBURN + GAS) 750-80 MG CHEW Chew 1 tablet by mouth daily as needed.    [provider]  famotidine (PEPCID) 20 MG tablet Take 1 tablet (20 mg total) by mouth 2 (two) times daily as needed for heartburn or indigestion. 01/08/18   Soyla Dryer, PA-C  gabapentin (NEURONTIN) 100 MG capsule Take 1 capsule (100 mg total) by mouth 2 (two) times daily. 06/16/20   Soyla Dryer, PA-C   lisinopril (ZESTRIL) 20 MG tablet TAKE 1 Tablet BY MOUTH ONCE EVERY DAY 06/16/20   Soyla Dryer, PA-C  metoprolol tartrate (LOPRESSOR) 50 MG tablet Take 1 tablet (50 mg total) by mouth 2 (two) times daily. 06/16/20   Soyla Dryer, PA-C  Omega-3 Fatty Acids (FISH OIL) 1200 MG CAPS Take 1 capsule (1,200 mg total) by mouth 2 (two) times daily. 01/08/18   Soyla Dryer, PA-C  prednisoLONE acetate (PRED FORTE) 1 % ophthalmic suspension 1 drop 4 (four) times daily. 01/13/20   [provider]    Physical Exam:   Vitals:   08/28/20 0957 08/28/20 1200 08/28/20 1250  BP: (!) 157/84 (!) 162/77 (!) 161/79  Pulse: (!) 56 (!) 54 (!) 55  Resp: '20 15 18  '$ Temp: 98.3 F (36.8 C)    SpO2: 98% 97% 93%     Physical Exam: Blood pressure (!) 161/79, pulse (!) 55, temperature 98.3 F (36.8 C), resp. rate 18, SpO2 93 %. Gen: Patient with probably some cognitive delay lying in bed with attentive and somewhat distressed mother at bedside. Eyes: sclera anicteric, conjuctiva mildly injected bilaterally CVS: S1-S2, regulary, no gallops Respiratory: Patient has rales at bases bilaterally left greater than right GI: NABS, soft, NT  LE: 2-3+ edema in upper and lower extremities bilaterally.  May be some facial edema as well. Neuro:  grossly nonfocal but probably has some mild cognitive delay. Psych: mood and affect appropriate to situation.   Data Review:    Labs: Basic Metabolic Panel: Recent Labs  Lab 08/28/20 1030  NA 137  K 3.7  CL 105  CO2 26  GLUCOSE 113*  BUN 39*  CREATININE 2.43*  CALCIUM 8.3*   Liver Function Tests: Recent Labs  Lab 08/28/20 1223  AST 22  ALT 20  ALKPHOS 112  BILITOT 0.9  PROT 5.2*  ALBUMIN 2.4*   No results for input(s): LIPASE, AMYLASE in the last 168 hours. No results for input(s): AMMONIA in the last 168 hours. CBC: Recent Labs  Lab 08/28/20 1030  WBC 6.8  HGB 9.7*  HCT 31.2*  MCV 93.1  PLT 334   Cardiac Enzymes: No results for  input(s): CKTOTAL, CKMB, CKMBINDEX, TROPONINI in the last 168 hours.  BNP (last 3 results) No results for input(s): PROBNP in the last 8760 hours. CBG: No results for input(s): GLUCAP in the last 168 hours.  Urinalysis    Component Value Date/Time   COLORURINE STRAW (A) 12/28/2017 1540   APPEARANCEUR CLEAR 12/28/2017 1540   LABSPEC 1.028 12/28/2017 1540   PHURINE 5.0 12/28/2017 1540   GLUCOSEU >=500 (A) 12/28/2017 1540   HGBUR NEGATIVE 12/28/2017 1540   BILIRUBINUR NEGATIVE 12/28/2017 1540   KETONESUR NEGATIVE  12/28/2017 1540   PROTEINUR NEGATIVE 12/28/2017 1540   NITRITE NEGATIVE 12/28/2017 1540   LEUKOCYTESUR NEGATIVE 12/28/2017 1540      Radiographic Studies: DG Chest 2 View  Result Date: 08/28/2020 CLINICAL DATA:  Edema and shortness of breath. EXAM: CHEST - 2 VIEW COMPARISON:  None. FINDINGS: Cardiac enlargement. Small left greater than right pleural effusions. Left greater than right basilar interstitial and airspace opacities. Degenerative change of the shoulders and thoracic spine. IMPRESSION: Cardiac enlargement with small left greater than right pleural effusions and bibasilar interstitial and airspace opacities, likely edema. Electronically Signed   By: Dahlia Bailiff MD   On: 08/28/2020 11:14    EKG: Independently reviewed.  Sinus rhythm at 60.  Occasional PVC.  Normal intervals.  Mild interventricular conduction delay.  Nonspecific flattening of T waves   Assessment/Plan:   Principal Problem:   Anasarca Active Problems:   Uncontrolled type 2 diabetes mellitus with hyperglycemia (HCC)   Hyperlipidemia   Essential hypertension   Neuropathy   60 year old man with nephrotic syndrome is admitted with acute kidney injury, pulmonary edema, anasarca and orthopnea after being initiated on Lasix 40 twice daily at new consultation with [employer].  Nephrotic syndrome causing anasarca Given kidney injury which is possibly secondary to intravascular  volume depletion, will hold off on Lasix for for now. Dr. Carolin Sicks called from nephrology, he will see in consultation. Of note patient's family has transportation issues and has a hard time coming back and forth for already scheduled outpatient biopsy, it might be helpful to have inpatient biopsy while patient is here if possible.  AKI Most likely secondary to prerenal causes with decreased intravascular volume in conjunction with ongoing ACE inhibitor use. We will hold off on hydration however will also hold off on initiating Lasix. Repeat creatinine in the morning  Orthopnea/PND/pulmonary edema with elevated BNP Orthopnea and pulmonary edema with elevated BNP is not likely to be from his nephrotic syndrome Echocardiogram is ordered EKG does show some poor R wave progression although patient denies history of MI He certainly does have uncontrolled hypertension so may well have HFpEF I have not started diuretics on this patient given AKI and no oxygen requirement at present.  HTN Continue metoprolol 50 twice daily Continue amlodipine 5 mg daily, however given association of amlodipine with peripheral edema, could consider another agent for blood pressure control. Hold lisinopril given AKI likely secondary to prerenal causes.  DM 2 Hold glipizide SSI AC, sensitive dose requested  Normocytic anemia Likely multifactorial We will send iron and folate/B12 studies Kidney dysfunction may well be contributory as well as possible ACD   Other information:   DVT prophylaxis: Subcu heparin ordered. Code Status: Full Family Communication: Caring and attentive mother at bedside throughout Disposition Plan: Home Consults called: Nephrology, Dr. Carolin Sicks Admission status: Inpatient  Dewaine Oats Derek [facility]  If 7PM-7AM, please contact night-coverage [email or web address] Password [facility] 08/28/2020, 2:08 PM

## 2020-08-29 ENCOUNTER — Encounter (HOSPITAL_COMMUNITY): Payer: Self-pay | Admitting: Internal Medicine

## 2020-08-29 ENCOUNTER — Inpatient Hospital Stay (HOSPITAL_COMMUNITY): Payer: Medicare HMO

## 2020-08-29 DIAGNOSIS — I517 Cardiomegaly: Secondary | ICD-10-CM

## 2020-08-29 DIAGNOSIS — R601 Generalized edema: Secondary | ICD-10-CM | POA: Diagnosis not present

## 2020-08-29 LAB — ECHOCARDIOGRAM COMPLETE
AR max vel: 1.71 cm2
AV Area VTI: 1.79 cm2
AV Area mean vel: 1.56 cm2
AV Mean grad: 4 mmHg
AV Peak grad: 7.5 mmHg
Ao pk vel: 1.37 m/s
Area-P 1/2: 4.54 cm2
S' Lateral: 4.2 cm

## 2020-08-29 LAB — COMPREHENSIVE METABOLIC PANEL
ALT: 19 U/L (ref 0–44)
AST: 22 U/L (ref 15–41)
Albumin: 2.8 g/dL — ABNORMAL LOW (ref 3.5–5.0)
Alkaline Phosphatase: 103 U/L (ref 38–126)
Anion gap: 5 (ref 5–15)
BUN: 38 mg/dL — ABNORMAL HIGH (ref 8–23)
CO2: 27 mmol/L (ref 22–32)
Calcium: 8.3 mg/dL — ABNORMAL LOW (ref 8.9–10.3)
Chloride: 106 mmol/L (ref 98–111)
Creatinine, Ser: 2.31 mg/dL — ABNORMAL HIGH (ref 0.61–1.24)
GFR, Estimated: 31 mL/min — ABNORMAL LOW (ref >60)
Glucose, Bld: 93 mg/dL (ref 70–99)
Potassium: 3.4 mmol/L — ABNORMAL LOW (ref 3.5–5.1)
Sodium: 138 mmol/L (ref 135–145)
Total Bilirubin: 0.6 mg/dL (ref 0.3–1.2)
Total Protein: 5.3 g/dL — ABNORMAL LOW (ref 6.5–8.1)

## 2020-08-29 LAB — CBC
HCT: 25.1 % — ABNORMAL LOW (ref 39.0–52.0)
Hemoglobin: 8.2 g/dL — ABNORMAL LOW (ref 13.0–17.0)
MCH: 29.1 pg (ref 26.0–34.0)
MCHC: 32.7 g/dL (ref 30.0–36.0)
MCV: 89 fL (ref 80.0–100.0)
Platelets: 264 10*3/uL (ref 150–400)
RBC: 2.82 MIL/uL — ABNORMAL LOW (ref 4.22–5.81)
RDW: 13.6 % (ref 11.5–15.5)
WBC: 6.1 10*3/uL (ref 4.0–10.5)
nRBC: 0 % (ref 0.0–0.2)

## 2020-08-29 LAB — GLUCOSE, CAPILLARY
Glucose-Capillary: 93 mg/dL (ref 70–99)
Glucose-Capillary: 91 mg/dL (ref 70–99)
Glucose-Capillary: 125 mg/dL — ABNORMAL HIGH (ref 70–99)
Glucose-Capillary: 111 mg/dL — ABNORMAL HIGH (ref 70–99)

## 2020-08-29 MED ORDER — SODIUM CHLORIDE 0.9 % IV SOLN
250.0000 mg | Freq: Every day | INTRAVENOUS | Status: AC
  Administered 2020-08-29 – 2020-08-30 (×2): 250 mg via INTRAVENOUS
  Filled 2020-08-29 (×2): qty 20

## 2020-08-29 MED ORDER — POTASSIUM CHLORIDE CRYS ER 20 MEQ PO TBCR
40.0000 meq | EXTENDED_RELEASE_TABLET | Freq: Two times a day (BID) | ORAL | Status: AC
  Administered 2020-08-29 – 2020-08-30 (×3): 40 meq via ORAL
  Filled 2020-08-29 (×3): qty 2

## 2020-08-29 MED ORDER — FUROSEMIDE 10 MG/ML IJ SOLN
80.0000 mg | Freq: Three times a day (TID) | INTRAMUSCULAR | Status: DC
  Administered 2020-08-29 – 2020-09-07 (×27): 80 mg via INTRAVENOUS
  Filled 2020-08-29 (×28): qty 8

## 2020-08-29 NOTE — Progress Notes (Signed)
PROGRESS NOTE    [name] [name]  [id number] DOB: September 15, 1960 DOA: 08/28/2020 PCP: [name], MD    Brief Narrative:  60 year old gentleman with history of hypertension, hyperlipidemia, type 2 diabetes who is recently followed up for nephrotic syndrome with gross proteinuria as outpatient admitted with worsening lower extremity swelling and anasarca with fluid overload and AKI.  Sent from nephrology office.   Assessment & Plan:   Principal Problem:   Anasarca Active Problems:   Uncontrolled type 2 diabetes mellitus with hyperglycemia (HCC)   Hyperlipidemia   Essential hypertension   Neuropathy   Nephrotic syndrome  Nephrotic syndrome with anasarca, nephrotic range proteinuria: 10.9 g protein in 24 hours.  Suspected diabetic nephropathy.  Currently with AKI and microscopic hematuria.  Outpatient serological testings negative so far. Scheduled for kidney biopsy on 3/21 with IR. Currently on IV Lasix and albumin, increasing doses.  Intake and output monitoring.  Oxygen as needed.  Acute kidney injury: Probably due to reduced renal perfusion.  Holding lisinopril.  Followed by nephrology.  Multifactorial anemia: Currently stable.  Hypertension: On metoprolol, amlodipine and diuretics.  Hypokalemia, replaced.  Type 2 diabetes, uncontrolled with hyperglycemia: On glipizide at home.  A1c pending.  Currently on sensitive sliding scale and is stable.   DVT prophylaxis: heparin injection 5,000 Units Start: 08/28/20 2200   Code Status: Full code. Family Communication: None.  He is talking to his mom. Disposition Plan: Status is: Inpatient  Remains inpatient appropriate because:Inpatient level of care appropriate due to severity of illness   Dispo: The patient is from: Home              Anticipated d/c is to: Home              Patient currently is not medically stable to d/c.   Difficult to place patient No         Consultants:   Nephrology  Procedures:    None  Antimicrobials:   None   Subjective: Patient seen and examined.  No overnight events.  He feels like his legs are like water bags so he cannot walk on it.  Objective: Vitals:   08/29/20 0159 08/29/20 0642 08/29/20 0650 08/29/20 0800  BP: (!) 174/78 (!) 126/94  (!) 164/86  Pulse: 62 66  74  Resp: '18 18  18  '$ Temp: 98.2 F (36.8 C) 97.8 F (36.6 C)  98.5 F (36.9 C)  TempSrc: Oral Oral  Oral  SpO2: 94% (!) 86% 94% 94%    Intake/Output Summary (Last 24 hours) at 08/29/2020 1200 Last data filed at 08/29/2020 1128 Gross per 24 hour  Intake 324.5 ml  Output 1525 ml  Net -1200.5 ml   There were no vitals filed for this visit.  Examination:  General exam: Appears chronically sick looking.  Not in any distress.  Slight distress on talking. Respiratory system: Poor air entry at bases.  No added sounds.  Currently on oxygen SpO2: 94 % O2 Flow Rate (L/min): 1 L/min  Cardiovascular system: S1 & S2 heard, pansystolic murmur at apex. 3+ edema both legs.  1+ edema both arms. Gastrointestinal system: Nontender.  Bowel sounds present. Central nervous system: Alert and oriented. No focal neurological deficits. Extremities: Symmetric 5 x 5 power. Skin: No rashes, lesions or ulcers Psychiatry: Judgement and insight appear normal. Mood & affect appropriate.     Data Reviewed: I have personally reviewed following labs and imaging studies  CBC: Recent Labs  Lab 08/28/20 1030 08/28/20 1508 08/29/20 0250  WBC  6.8 7.2 6.1  HGB 9.7* 8.7* 8.2*  HCT 31.2* 27.8* 25.1*  MCV 93.1 92.7 89.0  PLT 334 287 [id number]   Basic Metabolic Panel: Recent Labs  Lab 08/28/20 1030 08/28/20 1508 08/29/20 0250  NA 137  --  138  K 3.7  --  3.4*  CL 105  --  106  CO2 26  --  27  GLUCOSE 113*  --  93  BUN 39*  --  38*  CREATININE 2.43* 2.36* 2.31*  CALCIUM 8.3*  --  8.3*   GFR: CrCl cannot be calculated (Unknown ideal weight.). Liver Function Tests: Recent Labs  Lab 08/28/20 1223  08/29/20 0250  AST 22 22  ALT 20 19  ALKPHOS 112 103  BILITOT 0.9 0.6  PROT 5.2* 5.3*  ALBUMIN 2.4* 2.8*   No results for input(s): LIPASE, AMYLASE in the last 168 hours. No results for input(s): AMMONIA in the last 168 hours. Coagulation Profile: No results for input(s): INR, PROTIME in the last 168 hours. Cardiac Enzymes: No results for input(s): CKTOTAL, CKMB, CKMBINDEX, TROPONINI in the last 168 hours. BNP (last 3 results) No results for input(s): PROBNP in the last 8760 hours. HbA1C: Recent Labs    08/28/20 1621  HGBA1C 5.3   CBG: Recent Labs  Lab 08/28/20 1659 08/28/20 2146 08/29/20 0725 08/29/20 1136  GLUCAP 72 100* 93 91   Lipid Profile: No results for input(s): CHOL, HDL, LDLCALC, TRIG, CHOLHDL, LDLDIRECT in the last 72 hours. Thyroid Function Tests: No results for input(s): TSH, T4TOTAL, FREET4, T3FREE, THYROIDAB in the last 72 hours. Anemia Panel: Recent Labs    08/28/20 1508 08/28/20 1510  VITAMINB12 318  --   FOLATE  --  6.7  FERRITIN 61  --   TIBC 287  --   IRON 35*  --    Sepsis Labs: No results for input(s): PROCALCITON, LATICACIDVEN in the last 168 hours.  Recent Results (from the past 240 hour(s))  SARS CORONAVIRUS 2 (TAT 6-24 HRS) Nasopharyngeal Nasopharyngeal Swab     Status: None   Collection Time: 08/28/20  2:12 PM   Specimen: Nasopharyngeal Swab  Result Value Ref Range Status   SARS Coronavirus 2 NEGATIVE NEGATIVE Final    Comment: (NOTE) SARS-CoV-2 target nucleic acids are NOT DETECTED.  The SARS-CoV-2 RNA is generally detectable in upper and lower respiratory specimens during the acute phase of infection. Negative results do not preclude SARS-CoV-2 infection, do not rule out co-infections with other pathogens, and should not be used as the sole basis for treatment or other patient management decisions. Negative results must be combined with clinical observations, patient history, and epidemiological information. The  expected result is Negative.  Fact Sheet for Patients: SugarRoll.be  Fact Sheet for Healthcare Providers: https://www.woods-mathews.com/  This test is not yet approved or cleared by the [place] FDA and  has been authorized for detection and/or diagnosis of SARS-CoV-2 by FDA under an Emergency Use Authorization (EUA). This EUA will remain  in effect (meaning this test can be used) for the duration of the COVID-19 declaration under Se ction 564(b)(1) of the Act, 21 U.S.C. section 360bbb-3(b)(1), unless the authorization is terminated or revoked sooner.  Performed at [facility], [place] [address] [place], [place] [address]          Radiology Studies: DG Chest 2 View  Result Date: 08/28/2020 CLINICAL DATA:  Edema and shortness of breath. EXAM: CHEST - 2 VIEW COMPARISON:  None. FINDINGS: Cardiac enlargement. Small left greater than  right pleural effusions. Left greater than right basilar interstitial and airspace opacities. Degenerative change of the shoulders and thoracic spine. IMPRESSION: Cardiac enlargement with small left greater than right pleural effusions and bibasilar interstitial and airspace opacities, likely edema. Electronically Signed   By: Dahlia Bailiff MD   On: 08/28/2020 11:14        Scheduled Meds: . amLODipine  5 mg Oral Daily  . atorvastatin  10 mg Oral Daily  . furosemide  80 mg Intravenous Q8H  . gabapentin  100 mg Oral Daily  . heparin  5,000 Units Subcutaneous Q8H  . insulin aspart  0-9 Units Subcutaneous TID WC  . loratadine  10 mg Oral Daily  . metoprolol tartrate  50 mg Oral BID  . omega-3 acid ethyl esters  1 g Oral QHS  . potassium chloride  40 mEq Oral BID  . sodium chloride flush  3 mL Intravenous Q12H   Continuous Infusions: . sodium chloride    . ferric gluconate (FERRLECIT/NULECIT) IV       LOS: 1 day    Time spent: 30 minutes    Barb Merino, MD [facility] Pager  [phone]

## 2020-08-29 NOTE — Progress Notes (Signed)
[facility] NEPHROLOGY PROGRESS NOTE  Assessment/ Plan: Pt is a 60 y.o. yo male  With HTN, HLD, DM, recently seen at [facility] for nephrotic syndrome presents with worsening lower extremity edema/anasarca, seen as a consultation for the management of fluid overload and AKI.  #Nephrotic syndrome with anasarca: UA with 10.9 g of proteinuria.  He likely has diabetic nephropathy.  However with microscopic hematuria and AKI need to rule out underlying glomerular disease. Extensive serology test done as outpatient unremarkable, including ANA, ANCA, C3, C4, hepatitis B, C, HIV, RPR negative.  SPEP with no M spike and kappa lambda ratio was 0.95. IR consulted for kidney biopsy, likely biopsy on 3/21. Started IV Lasix and albumin for anasarca with not much response.  I will increase Lasix dose. Strict ins and out, monitor lab  #Acute kidney injury, nonoliguric: Probably hemodynamically mediated with reduced renal perfusion and ACE inhibitor.  Agree with holding lisinopril.  Diuretics to offload volume and to help renal perfusion.  Avoid nephrotoxins or hypotensive episode. Creatinine level is stable.  Continue diuretics as above.  #Anemia probably due to chronic disease and dilution: Iron saturation 12% therefore, I will order IV iron.  Monitor hemoglobin.  #Hypertension: Continue metoprolol, amlodipine and diuretics as above.  Agree with holding lisinopril.  #Hypokalemia: Replete potassium chloride.  Monitor lab.  #Uncontrolled type 2 diabetes: On insulin per primary team.  Subjective: Seen and examined.  Urine output is recorded only 900 cc with IV Lasix.  No new event.  Denies nausea vomiting chest pain or shortness of breath.  He is lying on bed. Objective Vital signs in last 24 hours: Vitals:   08/29/20 0159 08/29/20 0642 08/29/20 0650 08/29/20 0800  BP: (!) 174/78 (!) 126/94  (!) 164/86  Pulse: 62 66  74  Resp: '18 18  18  '$ Temp: 98.2 F (36.8 C) 97.8 F (36.6 C)   98.5 F (36.9 C)  TempSrc: Oral Oral  Oral  SpO2: 94% (!) 86% 94% 94%   Weight change:   Intake/Output Summary (Last 24 hours) at 08/29/2020 1045 Last data filed at 08/29/2020 0700 Gross per 24 hour  Intake 324.5 ml  Output 925 ml  Net -600.5 ml       Labs: Basic Metabolic Panel: Recent Labs  Lab 08/28/20 1030 08/28/20 1508 08/29/20 0250  NA 137  --  138  K 3.7  --  3.4*  CL 105  --  106  CO2 26  --  27  GLUCOSE 113*  --  93  BUN 39*  --  38*  CREATININE 2.43* 2.36* 2.31*  CALCIUM 8.3*  --  8.3*   Liver Function Tests: Recent Labs  Lab 08/28/20 1223 08/29/20 0250  AST 22 22  ALT 20 19  ALKPHOS 112 103  BILITOT 0.9 0.6  PROT 5.2* 5.3*  ALBUMIN 2.4* 2.8*   No results for input(s): LIPASE, AMYLASE in the last 168 hours. No results for input(s): AMMONIA in the last 168 hours. CBC: Recent Labs  Lab 08/28/20 1030 08/28/20 1508 08/29/20 0250  WBC 6.8 7.2 6.1  HGB 9.7* 8.7* 8.2*  HCT 31.2* 27.8* 25.1*  MCV 93.1 92.7 89.0  PLT 334 287 264   Cardiac Enzymes: No results for input(s): CKTOTAL, CKMB, CKMBINDEX, TROPONINI in the last 168 hours. CBG: Recent Labs  Lab 08/28/20 1659 08/28/20 2146 08/29/20 0725  GLUCAP 72 100* 93    Iron Studies:  Recent Labs    08/28/20 1508  IRON 35*  TIBC 287  FERRITIN 61   Studies/Results: DG Chest 2 View  Result Date: 08/28/2020 CLINICAL DATA:  Edema and shortness of breath. EXAM: CHEST - 2 VIEW COMPARISON:  None. FINDINGS: Cardiac enlargement. Small left greater than right pleural effusions. Left greater than right basilar interstitial and airspace opacities. Degenerative change of the shoulders and thoracic spine. IMPRESSION: Cardiac enlargement with small left greater than right pleural effusions and bibasilar interstitial and airspace opacities, likely edema. Electronically Signed   By: Dahlia Bailiff MD   On: 08/28/2020 11:14    Medications: Infusions: . sodium chloride    . albumin human 25 g (08/29/20  0647)    Scheduled Medications: . amLODipine  5 mg Oral Daily  . atorvastatin  10 mg Oral Daily  . furosemide  80 mg Intravenous BID  . gabapentin  100 mg Oral Daily  . heparin  5,000 Units Subcutaneous Q8H  . insulin aspart  0-9 Units Subcutaneous TID WC  . loratadine  10 mg Oral Daily  . metoprolol tartrate  50 mg Oral BID  . omega-3 acid ethyl esters  1 g Oral QHS  . sodium chloride flush  3 mL Intravenous Q12H    have reviewed scheduled and prn medications.  Physical Exam: General:NAD, comfortable Heart:RRR, s1s2 nl Lungs: Bibasal reduced breath sound, clear anteriorly Abdomen:soft, Non-tender, non-distended Extremities: Bilateral upper and lower extremities pitting edema present, anasarca Neurology: Alert awake and following commands  Hitomi Slape Prasad Kresha Abelson 08/29/2020,10:45 AM  LOS: 1 day

## 2020-08-29 NOTE — H&P (Signed)
Chief Complaint: Acute renal failure  Referring Physician(s): Carolin Sicks  Supervising Physician: Arne Cleveland  Patient Status: [facility] - In-pt  History of Present Illness: [name] [name] is a 60 y.o. male who presented to the emergency room yesterday complaining of increasing shortness of breath and dyspnea on exertion.  He has seen Dr. Hollie Salk in the past for proteinuria.  His creatinine has increased to 2.43.  It was 1.75 in August.  He was already scheduled for an outpatient random renal biopsy on March 22.  We are asked to go ahead and proceed with a biopsy while he is hospitalized.  Other medical issues include diabetes, hypertension, and hyperlipidemia.  Past Medical History:  Diagnosis Date  . Arthritis   . Diabetes mellitus without complication ([place]) [id number]  . GERD (gastroesophageal reflux disease)     Past Surgical History:  Procedure Laterality Date  . CATARACT EXTRACTION, BILATERAL  2018  . TONSILLECTOMY      Allergies: Patient has no known allergies.  Medications: Prior to Admission medications   Medication Sig Start Date End Date Taking? Authorizing Provider  acetaminophen (TYLENOL) 500 MG tablet Take 500 mg by mouth every 6 (six) hours as needed (arthritis).    Yes [provider]  amLODipine (NORVASC) 5 MG tablet TAKE 1 Tablet BY MOUTH ONCE EVERY DAY Patient taking differently: Take 5 mg by mouth daily. 06/16/20  Yes Soyla Dryer, PA-C  atorvastatin (LIPITOR) 10 MG tablet Take 10 mg by mouth daily. 08/17/20  Yes [provider]  benzonatate (TESSALON) 100 MG capsule Take 100 mg by mouth 3 (three) times daily as needed for cough. 08/24/20  Yes [provider]  Calcium Carbonate-Simethicone 750-80 MG CHEW Chew 1 tablet by mouth daily as needed (heartburn).   Yes [provider]  famotidine (PEPCID) 20 MG tablet Take 1 tablet (20 mg total) by mouth 2 (two) times daily as needed for heartburn or indigestion. 01/08/18   Yes Soyla Dryer, PA-C  furosemide (LASIX) 40 MG tablet Take 80 mg by mouth daily. 08/10/20  Yes [provider]  gabapentin (NEURONTIN) 100 MG capsule Take 1 capsule (100 mg total) by mouth 2 (two) times daily. Patient taking differently: Take 100 mg by mouth daily. 06/16/20  Yes Soyla Dryer, PA-C  glipiZIDE (GLUCOTROL) 5 MG tablet Take 5 mg by mouth daily. 08/26/20  Yes [provider]  lisinopril (ZESTRIL) 20 MG tablet TAKE 1 Tablet BY MOUTH ONCE EVERY DAY Patient taking differently: Take 20 mg by mouth at bedtime. 06/16/20  Yes Soyla Dryer, PA-C  loratadine (CLARITIN) 10 MG tablet Take 10 mg by mouth daily. 08/24/20  Yes [provider]  metoprolol tartrate (LOPRESSOR) 50 MG tablet Take 1 tablet (50 mg total) by mouth 2 (two) times daily. 06/16/20  Yes Soyla Dryer, PA-C  Omega-3 Fatty Acids (FISH OIL) 1200 MG CAPS Take 1 capsule (1,200 mg total) by mouth 2 (two) times daily. Patient taking differently: Take 1 capsule by mouth at bedtime. 01/08/18  Yes Soyla Dryer, PA-C  atorvastatin (LIPITOR) 20 MG tablet TAKE 1 Tablet BY MOUTH ONCE EVERY DAY Patient not taking: Reported on 08/28/2020 06/16/20   Soyla Dryer, PA-C     Family History  Problem Relation Age of Onset  . Hypertension Mother   . Diabetes Mother   . Cancer Father        prostate cancer  . Hypertension Maternal Grandfather   . Rheum arthritis Maternal Grandmother     Social History   Socioeconomic History  .  Marital status: Single    Spouse name: Not on file  . Number of children: Not on file  . Years of education: Not on file  . Highest education level: Not on file  Occupational History  . Not on file  Tobacco Use  . Smoking status: Never Smoker  . Smokeless tobacco: Never Used  Vaping Use  . Vaping Use: Never used  Substance and Sexual Activity  . Alcohol use: Never  . Drug use: Never  . Sexual activity: Not on file  Other Topics Concern  . Not on file  Social  History Narrative  . Not on file   Social Determinants of Health   Financial Resource Strain: Not on file  Food Insecurity: Not on file  Transportation Needs: Not on file  Physical Activity: Not on file  Stress: Not on file  Social Connections: Not on file     Review of Systems: A 12 point ROS discussed and pertinent positives are indicated in the HPI above.  All other systems are negative.  Review of Systems  Vital Signs: BP (!) 164/86 (BP Location: Left Arm)   Pulse 74   Temp 98.5 F (36.9 C) (Oral)   Resp 18   SpO2 94%   Physical Exam Vitals reviewed.  Constitutional:      Appearance: Normal appearance.  HENT:     Head: Normocephalic and atraumatic.  Eyes:     Extraocular Movements: Extraocular movements intact.  Cardiovascular:     Rate and Rhythm: Normal rate and regular rhythm.  Pulmonary:     Effort: Pulmonary effort is normal. No respiratory distress.     Breath sounds: Normal breath sounds.  Abdominal:     General: There is no distension.     Palpations: Abdomen is soft.     Tenderness: There is no abdominal tenderness.  Musculoskeletal:        General: Normal range of motion.     Cervical back: Normal range of motion.  Skin:    General: Skin is warm and dry.  Neurological:     General: No focal deficit present.     Mental Status: He is alert and oriented to person, place, and time.  Psychiatric:        Mood and Affect: Mood normal.        Behavior: Behavior normal.        Thought Content: Thought content normal.        Judgment: Judgment normal.     Imaging: DG Chest 2 View  Result Date: 08/28/2020 CLINICAL DATA:  Edema and shortness of breath. EXAM: CHEST - 2 VIEW COMPARISON:  None. FINDINGS: Cardiac enlargement. Small left greater than right pleural effusions. Left greater than right basilar interstitial and airspace opacities. Degenerative change of the shoulders and thoracic spine. IMPRESSION: Cardiac enlargement with small left greater than  right pleural effusions and bibasilar interstitial and airspace opacities, likely edema. Electronically Signed   By: Dahlia Bailiff MD   On: 08/28/2020 11:14   US Venous Img Lower Bilateral (DVT)  Result Date: 08/21/2020 CLINICAL DATA:  Nephrotic syndrome, bilateral peripheral edema EXAM: BILATERAL LOWER EXTREMITY VENOUS DOPPLER ULTRASOUND TECHNIQUE: Gray-scale sonography with graded compression, as well as color Doppler and duplex ultrasound were performed to evaluate the lower extremity deep venous systems from the level of the common femoral vein and including the common femoral, femoral, profunda femoral, popliteal and calf veins including the posterior tibial, peroneal and gastrocnemius veins when visible. The superficial great saphenous vein was  also interrogated. Spectral Doppler was utilized to evaluate flow at rest and with distal augmentation maneuvers in the common femoral, femoral and popliteal veins. COMPARISON:  None. FINDINGS: RIGHT LOWER EXTREMITY Common Femoral Vein: No evidence of thrombus. Normal compressibility, respiratory phasicity and response to augmentation. Saphenofemoral Junction: No evidence of thrombus. Normal compressibility and flow on color Doppler imaging. Profunda Femoral Vein: No evidence of thrombus. Normal compressibility and flow on color Doppler imaging. Femoral Vein: No evidence of thrombus. Normal compressibility, respiratory phasicity and response to augmentation. Popliteal Vein: No evidence of thrombus. Normal compressibility, respiratory phasicity and response to augmentation. Calf Veins: No evidence of thrombus. Normal compressibility and flow on color Doppler imaging. Superficial Great Saphenous Vein: No evidence of thrombus. Normal compressibility. Other Findings: Marked bilateral subcutaneous edema from the proximal thigh to the distal calf LEFT LOWER EXTREMITY Common Femoral Vein: No evidence of thrombus. Normal compressibility, respiratory phasicity and response  to augmentation. Saphenofemoral Junction: No evidence of thrombus. Normal compressibility and flow on color Doppler imaging. Profunda Femoral Vein: No evidence of thrombus. Normal compressibility and flow on color Doppler imaging. Femoral Vein: No evidence of thrombus. Normal compressibility, respiratory phasicity and response to augmentation. Popliteal Vein: No evidence of thrombus. Normal compressibility, respiratory phasicity and response to augmentation. Calf Veins: No evidence of thrombus. Normal compressibility and flow on color Doppler imaging. Superficial Great Saphenous Vein: No evidence of thrombus. Normal compressibility. Other Findings: Symmetric subcutaneous edema from the proximal thigh to the distal calf IMPRESSION: Negative for significant DVT in either extremity. Marked symmetric lower extremity subcutaneous edema Electronically Signed   By: Jerilynn Mages.  Shick M.D.   On: 08/21/2020 14:12    Labs:  CBC: Recent Labs    08/28/20 1030 08/28/20 1508 08/29/20 0250  WBC 6.8 7.2 6.1  HGB 9.7* 8.7* 8.2*  HCT 31.2* 27.8* 25.1*  PLT 334 287 264    COAGS: No results for input(s): INR, APTT in the last 8760 hours.  BMP: Recent Labs    10/18/19 1153 01/31/20 1216 04/27/20 1118 08/28/20 1030 08/28/20 1508 08/29/20 0250  NA 141 139 139 137  --  138  K 4.1 4.5 4.1 3.7  --  3.4*  CL 107 107 106 105  --  106  CO2 '27 25 26 26  '$ --  27  GLUCOSE 142* 136* 111* 113*  --  93  BUN 21* 26* 17 39*  --  38*  CALCIUM 8.8* 9.0 8.8* 8.3*  --  8.3*  CREATININE 1.23 1.75* 1.29* 2.43* 2.36* 2.31*  GFRNONAA >60 41* >60 30* 31* 31*  GFRAA >60 48*  --   --   --   --     LIVER FUNCTION TESTS: Recent Labs    01/31/20 1216 04/27/20 1118 08/28/20 1223 08/29/20 0250  BILITOT 0.7 0.9 0.9 0.6  AST '19 20 22 22  '$ ALT '17 15 20 19  '$ ALKPHOS 85 115 112 103  PROT 6.5 6.3* 5.2* 5.3*  ALBUMIN 3.5 3.1* 2.4* 2.8*    TUMOR MARKERS: No results for input(s): AFPTM, CEA, CA199, CHROMGRNA in the last 8760  hours.  Assessment and Plan:  Worsening renal failure.  We will proceed with image guided random renal biopsy as IR schedule allows on Monday.  Risks and benefits of random renal biopsy was discussed with the patient and/or patient's family including, but not limited to bleeding, infection, damage to adjacent structures or low yield requiring additional tests.  All of the questions were answered and there is agreement to proceed.  Consent signed and in chart.  Thank you for this interesting consult.  I greatly enjoyed meeting [name] [name] and look forward to participating in their care.  A copy of this report was sent to the requesting provider on this date.  Electronically Signed: Murrell Redden, PA-C   08/29/2020, 12:17 PM      I spent a total of 20 Minutes  in face to face in clinical consultation, greater than 50% of which was counseling/coordinating care for end of renal biopsy

## 2020-08-29 NOTE — Progress Notes (Signed)
  Echocardiogram 2D Echocardiogram has been performed.  Merrie Roof F 08/29/2020, 3:05 PM

## 2020-08-30 DIAGNOSIS — R601 Generalized edema: Secondary | ICD-10-CM | POA: Diagnosis not present

## 2020-08-30 LAB — RENAL FUNCTION PANEL
Albumin: 3 g/dL — ABNORMAL LOW (ref 3.5–5.0)
Anion gap: 10 (ref 5–15)
BUN: 40 mg/dL — ABNORMAL HIGH (ref 8–23)
CO2: 24 mmol/L (ref 22–32)
Calcium: 8.7 mg/dL — ABNORMAL LOW (ref 8.9–10.3)
Chloride: 104 mmol/L (ref 98–111)
Creatinine, Ser: 2.29 mg/dL — ABNORMAL HIGH (ref 0.61–1.24)
GFR, Estimated: 32 mL/min — ABNORMAL LOW (ref >60)
Glucose, Bld: 105 mg/dL — ABNORMAL HIGH (ref 70–99)
Phosphorus: 4.8 mg/dL — ABNORMAL HIGH (ref 2.5–4.6)
Potassium: 4.6 mmol/L (ref 3.5–5.1)
Sodium: 138 mmol/L (ref 135–145)

## 2020-08-30 LAB — GLUCOSE, CAPILLARY
Glucose-Capillary: 95 mg/dL (ref 70–99)
Glucose-Capillary: 90 mg/dL (ref 70–99)
Glucose-Capillary: 83 mg/dL (ref 70–99)
Glucose-Capillary: 87 mg/dL (ref 70–99)

## 2020-08-30 LAB — PROTIME-INR
INR: 1.1 (ref 0.8–1.2)
Prothrombin Time: 14.1 seconds (ref 11.4–15.2)

## 2020-08-30 MED ORDER — AMLODIPINE BESYLATE 10 MG PO TABS
10.0000 mg | ORAL_TABLET | Freq: Every day | ORAL | Status: DC
  Administered 2020-08-31 – 2020-09-10 (×12): 10 mg via ORAL
  Filled 2020-08-30 (×12): qty 1

## 2020-08-30 MED ORDER — AMLODIPINE BESYLATE 5 MG PO TABS
5.0000 mg | ORAL_TABLET | Freq: Once | ORAL | Status: AC
  Administered 2020-08-30: 5 mg via ORAL

## 2020-08-30 NOTE — Progress Notes (Signed)
PROGRESS NOTE    Franki Gens  [id number] DOB: 1961/02/01 DOA: 08/28/2020 PCP: [name], MD    Brief Narrative:  60 year old gentleman with history of hypertension, hyperlipidemia, type 2 diabetes who is recently followed up for nephrotic syndrome with gross proteinuria as outpatient admitted with worsening lower extremity swelling and anasarca with fluid overload and AKI.  Sent from nephrology office.   Assessment & Plan:   Principal Problem:   Anasarca Active Problems:   Uncontrolled type 2 diabetes mellitus with hyperglycemia (HCC)   Hyperlipidemia   Essential hypertension   Neuropathy   Nephrotic syndrome  Nephrotic syndrome with anasarca, nephrotic range proteinuria: 10.9 g protein in 24 hours.  Suspected diabetic nephropathy.  Currently with AKI and microscopic hematuria.  Outpatient serological testings negative so far. Scheduled for kidney biopsy on 3/21 with IR. Currently on IV Lasix and albumin, Intake and output monitoring.  Oxygen as needed.  Acute kidney injury: Probably due to reduced renal perfusion.  Holding lisinopril.  Followed by nephrology.  Multifactorial anemia: Currently stable.  Hypertension: On metoprolol, amlodipine and diuretics.  Hypokalemia, replaced.  Type 2 diabetes, uncontrolled with hyperglycemia: On glipizide at home.  Hemoglobin A1c 5.3.  Currently on sensitive sliding scale and is stable.   DVT prophylaxis: heparin injection 5,000 Units Start: 08/28/20 2200   Code Status: Full code. Family Communication: None.  He is talking to his mom. Disposition Plan: Status is: Inpatient  Remains inpatient appropriate because:Inpatient level of care appropriate due to severity of illness   Dispo: The patient is from: Home              Anticipated d/c is to: Home              Patient currently is not medically stable to d/c.   Difficult to place patient No         Consultants:   Nephrology  Procedures:    None  Antimicrobials:   None   Subjective: Seen and examined.  No overnight events.  He thinks his legs are slightly better.  Denies any shortness of breath at rest.  He has not been mobilized. Objective: Vitals:   08/29/20 2022 08/30/20 0220 08/30/20 0224 08/30/20 0926  BP: (!) 173/94  (!) 193/87 (!) 188/85  Pulse: 69  79 82  Resp: 18  (!) 21 20  Temp: 98 F (36.7 C)  98.2 F (36.8 C) 98.4 F (36.9 C)  TempSrc: Oral  Oral Oral  SpO2: 93% (!) 89% 96% 98%    Intake/Output Summary (Last 24 hours) at 08/30/2020 1127 Last data filed at 08/30/2020 1016 Gross per 24 hour  Intake 1080 ml  Output 2675 ml  Net -1595 ml   There were no vitals filed for this visit.  Examination:  General exam: Appears chronically sick looking.  Not in any distress.  Slight distress on talking. Respiratory system: Poor air entry at bases.  No added sounds.  Currently on oxygen SpO2: 98 % O2 Flow Rate (L/min): 2 L/min  Cardiovascular system: S1 & S2 heard, pansystolic murmur at apex. 3+ edema both legs.  1+ edema both arms. Gastrointestinal system: Nontender.  Bowel sounds present. Central nervous system: Alert and oriented. No focal neurological deficits. Extremities: Symmetric 5 x 5 power. Skin: No rashes, lesions or ulcers Psychiatry: Judgement and insight appear normal. Mood & affect appropriate.     Data Reviewed: I have personally reviewed following labs and imaging studies  CBC: Recent Labs  Lab 08/28/20 1030 08/28/20 1508 08/29/20  0250  WBC 6.8 7.2 6.1  HGB 9.7* 8.7* 8.2*  HCT 31.2* 27.8* 25.1*  MCV 93.1 92.7 89.0  PLT 334 287 [id number]   Basic Metabolic Panel: Recent Labs  Lab 08/28/20 1030 08/28/20 1508 08/29/20 0250 08/30/20 0308  NA 137  --  138 138  K 3.7  --  3.4* 4.6  CL 105  --  106 104  CO2 26  --  27 24  GLUCOSE 113*  --  93 105*  BUN 39*  --  38* 40*  CREATININE 2.43* 2.36* 2.31* 2.29*  CALCIUM 8.3*  --  8.3* 8.7*  PHOS  --   --   --  4.8*   GFR: CrCl  cannot be calculated (Unknown ideal weight.). Liver Function Tests: Recent Labs  Lab 08/28/20 1223 08/29/20 0250 08/30/20 0308  AST 22 22  --   ALT 20 19  --   ALKPHOS 112 103  --   BILITOT 0.9 0.6  --   PROT 5.2* 5.3*  --   ALBUMIN 2.4* 2.8* 3.0*   No results for input(s): LIPASE, AMYLASE in the last 168 hours. No results for input(s): AMMONIA in the last 168 hours. Coagulation Profile: Recent Labs  Lab 08/30/20 0308  INR 1.1   Cardiac Enzymes: No results for input(s): CKTOTAL, CKMB, CKMBINDEX, TROPONINI in the last 168 hours. BNP (last 3 results) No results for input(s): PROBNP in the last 8760 hours. HbA1C: Recent Labs    08/28/20 1621  HGBA1C 5.3   CBG: Recent Labs  Lab 08/29/20 0725 08/29/20 1136 08/29/20 1635 08/29/20 2151 08/30/20 0711  GLUCAP 93 91 125* 111* 95   Lipid Profile: No results for input(s): CHOL, HDL, LDLCALC, TRIG, CHOLHDL, LDLDIRECT in the last 72 hours. Thyroid Function Tests: No results for input(s): TSH, T4TOTAL, FREET4, T3FREE, THYROIDAB in the last 72 hours. Anemia Panel: Recent Labs    08/28/20 1508 08/28/20 1510  VITAMINB12 318  --   FOLATE  --  6.7  FERRITIN 61  --   TIBC 287  --   IRON 35*  --    Sepsis Labs: No results for input(s): PROCALCITON, LATICACIDVEN in the last 168 hours.  Recent Results (from the past 240 hour(s))  SARS CORONAVIRUS 2 (TAT 6-24 HRS) Nasopharyngeal Nasopharyngeal Swab     Status: None   Collection Time: 08/28/20  2:12 PM   Specimen: Nasopharyngeal Swab  Result Value Ref Range Status   SARS Coronavirus 2 NEGATIVE NEGATIVE Final    Comment: (NOTE) SARS-CoV-2 target nucleic acids are NOT DETECTED.  The SARS-CoV-2 RNA is generally detectable in upper and lower respiratory specimens during the acute phase of infection. Negative results do not preclude SARS-CoV-2 infection, do not rule out co-infections with other pathogens, and should not be used as the sole basis for treatment or other  patient management decisions. Negative results must be combined with clinical observations, patient history, and epidemiological information. The expected result is Negative.  Fact Sheet for Patients: SugarRoll.be  Fact Sheet for Healthcare Providers: https://www.woods-mathews.com/  This test is not yet approved or cleared by the [place] FDA and  has been authorized for detection and/or diagnosis of SARS-CoV-2 by FDA under an Emergency Use Authorization (EUA). This EUA will remain  in effect (meaning this test can be used) for the duration of the COVID-19 declaration under Se ction 564(b)(1) of the Act, 21 U.S.C. section 360bbb-3(b)(1), unless the authorization is terminated or revoked sooner.  Performed at [facility], [place]  [address], [place], [place] [address]          Radiology Studies: ECHOCARDIOGRAM COMPLETE  Result Date: 08/29/2020    ECHOCARDIOGRAM REPORT   Patient Name:   FLORES BARRIERE Date of Exam: 08/29/2020 Medical Rec #:  [id number]     Height:       67.8 in Accession #:    [id number]    Weight:       159.5 lb Date of Birth:  1961/02/02     BSA:          1.852 m Patient Age:    65 years      BP:           164/86 mmHg Patient Gender: M             HR:           55 bpm. Exam Location:  Inpatient Procedure: 2D Echo, Cardiac Doppler and Color Doppler Indications:    I51.7 Cardiomegaly  History:        Patient has no prior history of Echocardiogram examinations.                 Signs/Symptoms:Altered Mental Status.  Sonographer:    Merrie Roof Referring Phys: [id number] [place]  1. Left ventricular ejection fraction, by estimation, is 40 to 45%. The left ventricle has mildly decreased function. The left ventricle demonstrates global hypokinesis. Left ventricular diastolic parameters are consistent with Grade II diastolic dysfunction (pseudonormalization). Elevated left ventricular end-diastolic  pressure.  2. Right ventricular systolic function is moderately reduced. The right ventricular size is moderately enlarged. There is severely elevated pulmonary artery systolic pressure. The estimated right ventricular systolic pressure is 99991111 mmHg.  3. Left atrial size was moderately dilated.  4. Right atrial size was mildly dilated.  5. The pericardial effusion is circumferential. Large pleural effusion in the left lateral region.  6. The mitral valve is normal in structure. Moderate mitral valve regurgitation. No evidence of mitral stenosis.  7. Tricuspid valve regurgitation is moderate.  8. The aortic valve is normal in structure. Aortic valve regurgitation is not visualized. Mild to moderate aortic valve sclerosis/calcification is present, without any evidence of aortic stenosis.  9. The inferior vena cava is dilated in size with <50% respiratory variability, suggesting right atrial pressure of 15 mmHg. FINDINGS  Left Ventricle: Left ventricular ejection fraction, by estimation, is 40 to 45%. The left ventricle has mildly decreased function. The left ventricle demonstrates global hypokinesis. The left ventricular internal cavity size was normal in size. There is  no left ventricular hypertrophy. Left ventricular diastolic parameters are consistent with Grade II diastolic dysfunction (pseudonormalization). Elevated left ventricular end-diastolic pressure. Right Ventricle: The right ventricular size is moderately enlarged. No increase in right ventricular wall thickness. Right ventricular systolic function is moderately reduced. There is severely elevated pulmonary artery systolic pressure. The tricuspid regurgitant velocity is 4.22 m/s, and with an assumed right atrial pressure of 15 mmHg, the estimated right ventricular systolic pressure is 99991111 mmHg. Left Atrium: Left atrial size was moderately dilated. Right Atrium: Right atrial size was mildly dilated. Pericardium: Trivial pericardial effusion is present.  The pericardial effusion is circumferential. Mitral Valve: The mitral valve is normal in structure. There is mild thickening of the mitral valve leaflet(s). Moderate mitral valve regurgitation. No evidence of mitral valve stenosis. Tricuspid Valve: The tricuspid valve is normal in structure. Tricuspid valve regurgitation is moderate . No evidence of tricuspid stenosis. Aortic Valve: The aortic valve is normal in  structure. Aortic valve regurgitation is not visualized. Mild to moderate aortic valve sclerosis/calcification is present, without any evidence of aortic stenosis. Aortic valve mean gradient measures 4.0 mmHg. Aortic valve peak gradient measures 7.5 mmHg. Aortic valve area, by VTI measures 1.79 cm. Pulmonic Valve: The pulmonic valve was normal in structure. Pulmonic valve regurgitation is not visualized. No evidence of pulmonic stenosis. Aorta: The aortic root is normal in size and structure. Venous: The inferior vena cava is dilated in size with less than 50% respiratory variability, suggesting right atrial pressure of 15 mmHg. IAS/Shunts: No atrial level shunt detected by color flow Doppler. Additional Comments: There is a large pleural effusion in the left lateral region.  LEFT VENTRICLE PLAX 2D LVIDd:         5.00 cm  Diastology LVIDs:         4.20 cm  LV e' medial:    4.24 cm/s LV PW:         0.80 cm  LV E/e' medial:  30.0 LV IVS:        0.70 cm  LV e' lateral:   7.40 cm/s LVOT diam:     1.80 cm  LV E/e' lateral: 17.2 LV SV:         48 LV SV Index:   26 LVOT Area:     2.54 cm                          3D Volume EF:                         3D EF:        47 %                         LV EDV:       211 ml                         LV ESV:       112 ml                         LV SV:        99 ml RIGHT VENTRICLE             IVC RV Basal diam:  3.80 cm     IVC diam: 2.00 cm RV S prime:     11.90 cm/s TAPSE (M-mode): 2.8 cm LEFT ATRIUM              Index       RIGHT ATRIUM           Index LA diam:        4.20 cm   2.27 cm/m  RA Area:     23.60 cm LA Vol (A2C):   142.0 ml 76.69 ml/m RA Volume:   78.30 ml  42.29 ml/m LA Vol (A4C):   86.4 ml  46.66 ml/m LA Biplane Vol: 116.0 ml 62.65 ml/m  AORTIC VALVE AV Area (Vmax):    1.71 cm AV Area (Vmean):   1.56 cm AV Area (VTI):     1.79 cm AV Vmax:           137.00 cm/s AV Vmean:          91.600 cm/s AV VTI:  0.269 m AV Peak Grad:      7.5 mmHg AV Mean Grad:      4.0 mmHg LVOT Vmax:         92.00 cm/s LVOT Vmean:        56.000 cm/s LVOT VTI:          0.189 m LVOT/AV VTI ratio: 0.70  AORTA Ao Root diam: 3.20 cm MITRAL VALVE                TRICUSPID VALVE MV Area (PHT): 4.54 cm     TR Peak grad:   71.2 mmHg MV Decel Time: 167 msec     TR Vmax:        422.00 cm/s MV E velocity: 127.00 cm/s MV A velocity: 105.00 cm/s  SHUNTS MV E/A ratio:  1.21         Systemic VTI:  0.19 m                             Systemic Diam: 1.80 cm Ena Dawley MD Electronically signed by Ena Dawley MD Signature Date/Time: 08/29/2020/3:16:54 PM    Final         Scheduled Meds: . Derrill Memo ON 08/31/2020] amLODipine  10 mg Oral Daily  . atorvastatin  10 mg Oral Daily  . furosemide  80 mg Intravenous Q8H  . gabapentin  100 mg Oral Daily  . heparin  5,000 Units Subcutaneous Q8H  . insulin aspart  0-9 Units Subcutaneous TID WC  . loratadine  10 mg Oral Daily  . metoprolol tartrate  50 mg Oral BID  . omega-3 acid ethyl esters  1 g Oral QHS  . sodium chloride flush  3 mL Intravenous Q12H   Continuous Infusions: . sodium chloride    . ferric gluconate (FERRLECIT/NULECIT) IV 250 mg (08/29/20 1435)     LOS: 2 days    Time spent: 30 minutes    Barb Merino, MD [facility] Pager [phone]

## 2020-08-30 NOTE — Progress Notes (Signed)
[facility] NEPHROLOGY PROGRESS NOTE  Assessment/ Plan: Pt is a 60 y.o. yo male  With HTN, HLD, DM, recently seen at [facility] for nephrotic syndrome presents with worsening lower extremity edema/anasarca, seen as a consultation for the management of fluid overload and AKI.  #Nephrotic syndrome with anasarca: UA with 10.9 g of proteinuria.  He likely has diabetic nephropathy.  However with microscopic hematuria and AKI need to rule out underlying glomerular disease. Extensive serology test done as outpatient unremarkable, including ANA, ANCA, C3, C4, hepatitis B, C, HIV, RPR negative.  SPEP with no M spike and kappa lambda ratio was 0.95. IR consulted for kidney biopsy, plan for biopsy on 3/21. We will continue IV diuretics and albumin as he is responding well. Strict ins and out, monitor lab  #Acute kidney injury, nonoliguric: Probably hemodynamically mediated with reduced renal perfusion and ACE inhibitor.  Agree with holding lisinopril.  Diuretics to offload volume and to help renal perfusion.  Avoid nephrotoxins or hypotensive episode. Creatinine level is stable.  Continue diuretics as above.  #Anemia probably due to chronic disease and dilution: Iron saturation 12% therefore ordered IV iron.  Monitor hemoglobin.  #Hypertension: Blood pressure elevated therefore increase amlodipine to 10 mg.  Continue metoprolol and diuretics as above.  Continue to hold lisinopril.   #Hypokalemia: Repleted potassium chloride.  Monitor lab.  #Uncontrolled type 2 diabetes: On insulin per primary team.  Subjective: Seen and examined.  Urine output around 2 L in 24 hours.  He feels good without any nausea vomiting chest pain or shortness of breath.  He understands that he has biopsy tomorrow morning.  Objective Vital signs in last 24 hours: Vitals:   08/29/20 2022 08/30/20 0220 08/30/20 0224 08/30/20 0926  BP: (!) 173/94  (!) 193/87 (!) 188/85  Pulse: 69  79 82  Resp: 18  (!) 21  20  Temp: 98 F (36.7 C)  98.2 F (36.8 C) 98.4 F (36.9 C)  TempSrc: Oral  Oral Oral  SpO2: 93% (!) 89% 96% 98%   Weight change:   Intake/Output Summary (Last 24 hours) at 08/30/2020 1021 Last data filed at 08/30/2020 1016 Gross per 24 hour  Intake 1080 ml  Output 2675 ml  Net -1595 ml       Labs: Basic Metabolic Panel: Recent Labs  Lab 08/28/20 1030 08/28/20 1508 08/29/20 0250 08/30/20 0308  NA 137  --  138 138  K 3.7  --  3.4* 4.6  CL 105  --  106 104  CO2 26  --  27 24  GLUCOSE 113*  --  93 105*  BUN 39*  --  38* 40*  CREATININE 2.43* 2.36* 2.31* 2.29*  CALCIUM 8.3*  --  8.3* 8.7*  PHOS  --   --   --  4.8*   Liver Function Tests: Recent Labs  Lab 08/28/20 1223 08/29/20 0250 08/30/20 0308  AST 22 22  --   ALT 20 19  --   ALKPHOS 112 103  --   BILITOT 0.9 0.6  --   PROT 5.2* 5.3*  --   ALBUMIN 2.4* 2.8* 3.0*   No results for input(s): LIPASE, AMYLASE in the last 168 hours. No results for input(s): AMMONIA in the last 168 hours. CBC: Recent Labs  Lab 08/28/20 1030 08/28/20 1508 08/29/20 0250  WBC 6.8 7.2 6.1  HGB 9.7* 8.7* 8.2*  HCT 31.2* 27.8* 25.1*  MCV 93.1 92.7 89.0  PLT 334 287 264   Cardiac Enzymes: No  results for input(s): CKTOTAL, CKMB, CKMBINDEX, TROPONINI in the last 168 hours. CBG: Recent Labs  Lab 08/29/20 0725 08/29/20 1136 08/29/20 1635 08/29/20 2151 08/30/20 0711  GLUCAP 93 91 125* 111* 95    Iron Studies:  Recent Labs    08/28/20 1508  IRON 35*  TIBC 287  FERRITIN 61   Studies/Results: DG Chest 2 View  Result Date: 08/28/2020 CLINICAL DATA:  Edema and shortness of breath. EXAM: CHEST - 2 VIEW COMPARISON:  None. FINDINGS: Cardiac enlargement. Small left greater than right pleural effusions. Left greater than right basilar interstitial and airspace opacities. Degenerative change of the shoulders and thoracic spine. IMPRESSION: Cardiac enlargement with small left greater than right pleural effusions and bibasilar  interstitial and airspace opacities, likely edema. Electronically Signed   By: Dahlia Bailiff MD   On: 08/28/2020 11:14   ECHOCARDIOGRAM COMPLETE  Result Date: 08/29/2020    ECHOCARDIOGRAM REPORT   Patient Name:   [name] [name] Date of Exam: 08/29/2020 Medical Rec #:  [id number]     Height:       67.8 in Accession #:    [id number]    Weight:       159.5 lb Date of Birth:  16-Jul-1960     BSA:          1.852 m Patient Age:    27 years      BP:           164/86 mmHg Patient Gender: M             HR:           55 bpm. Exam Location:  Inpatient Procedure: 2D Echo, Cardiac Doppler and Color Doppler Indications:    I51.7 Cardiomegaly  History:        Patient has no prior history of Echocardiogram examinations.                 Signs/Symptoms:Altered Mental Status.  Sonographer:    Merrie Roof Referring Phys: [id number] [place]  1. Left ventricular ejection fraction, by estimation, is 40 to 45%. The left ventricle has mildly decreased function. The left ventricle demonstrates global hypokinesis. Left ventricular diastolic parameters are consistent with Grade II diastolic dysfunction (pseudonormalization). Elevated left ventricular end-diastolic pressure.  2. Right ventricular systolic function is moderately reduced. The right ventricular size is moderately enlarged. There is severely elevated pulmonary artery systolic pressure. The estimated right ventricular systolic pressure is 99991111 mmHg.  3. Left atrial size was moderately dilated.  4. Right atrial size was mildly dilated.  5. The pericardial effusion is circumferential. Large pleural effusion in the left lateral region.  6. The mitral valve is normal in structure. Moderate mitral valve regurgitation. No evidence of mitral stenosis.  7. Tricuspid valve regurgitation is moderate.  8. The aortic valve is normal in structure. Aortic valve regurgitation is not visualized. Mild to moderate aortic valve sclerosis/calcification is present, without any  evidence of aortic stenosis.  9. The inferior vena cava is dilated in size with <50% respiratory variability, suggesting right atrial pressure of 15 mmHg. FINDINGS  Left Ventricle: Left ventricular ejection fraction, by estimation, is 40 to 45%. The left ventricle has mildly decreased function. The left ventricle demonstrates global hypokinesis. The left ventricular internal cavity size was normal in size. There is  no left ventricular hypertrophy. Left ventricular diastolic parameters are consistent with Grade II diastolic dysfunction (pseudonormalization). Elevated left ventricular end-diastolic pressure. Right Ventricle: The right ventricular size is moderately enlarged. No  increase in right ventricular wall thickness. Right ventricular systolic function is moderately reduced. There is severely elevated pulmonary artery systolic pressure. The tricuspid regurgitant velocity is 4.22 m/s, and with an assumed right atrial pressure of 15 mmHg, the estimated right ventricular systolic pressure is 99991111 mmHg. Left Atrium: Left atrial size was moderately dilated. Right Atrium: Right atrial size was mildly dilated. Pericardium: Trivial pericardial effusion is present. The pericardial effusion is circumferential. Mitral Valve: The mitral valve is normal in structure. There is mild thickening of the mitral valve leaflet(s). Moderate mitral valve regurgitation. No evidence of mitral valve stenosis. Tricuspid Valve: The tricuspid valve is normal in structure. Tricuspid valve regurgitation is moderate . No evidence of tricuspid stenosis. Aortic Valve: The aortic valve is normal in structure. Aortic valve regurgitation is not visualized. Mild to moderate aortic valve sclerosis/calcification is present, without any evidence of aortic stenosis. Aortic valve mean gradient measures 4.0 mmHg. Aortic valve peak gradient measures 7.5 mmHg. Aortic valve area, by VTI measures 1.79 cm. Pulmonic Valve: The pulmonic valve was normal in  structure. Pulmonic valve regurgitation is not visualized. No evidence of pulmonic stenosis. Aorta: The aortic root is normal in size and structure. Venous: The inferior vena cava is dilated in size with less than 50% respiratory variability, suggesting right atrial pressure of 15 mmHg. IAS/Shunts: No atrial level shunt detected by color flow Doppler. Additional Comments: There is a large pleural effusion in the left lateral region.  LEFT VENTRICLE PLAX 2D LVIDd:         5.00 cm  Diastology LVIDs:         4.20 cm  LV e' medial:    4.24 cm/s LV PW:         0.80 cm  LV E/e' medial:  30.0 LV IVS:        0.70 cm  LV e' lateral:   7.40 cm/s LVOT diam:     1.80 cm  LV E/e' lateral: 17.2 LV SV:         48 LV SV Index:   26 LVOT Area:     2.54 cm                          3D Volume EF:                         3D EF:        47 %                         LV EDV:       211 ml                         LV ESV:       112 ml                         LV SV:        99 ml RIGHT VENTRICLE             IVC RV Basal diam:  3.80 cm     IVC diam: 2.00 cm RV S prime:     11.90 cm/s TAPSE (M-mode): 2.8 cm LEFT ATRIUM              Index       RIGHT ATRIUM  Index LA diam:        4.20 cm  2.27 cm/m  RA Area:     23.60 cm LA Vol (A2C):   142.0 ml 76.69 ml/m RA Volume:   78.30 ml  42.29 ml/m LA Vol (A4C):   86.4 ml  46.66 ml/m LA Biplane Vol: 116.0 ml 62.65 ml/m  AORTIC VALVE AV Area (Vmax):    1.71 cm AV Area (Vmean):   1.56 cm AV Area (VTI):     1.79 cm AV Vmax:           137.00 cm/s AV Vmean:          91.600 cm/s AV VTI:            0.269 m AV Peak Grad:      7.5 mmHg AV Mean Grad:      4.0 mmHg LVOT Vmax:         92.00 cm/s LVOT Vmean:        56.000 cm/s LVOT VTI:          0.189 m LVOT/AV VTI ratio: 0.70  AORTA Ao Root diam: 3.20 cm MITRAL VALVE                TRICUSPID VALVE MV Area (PHT): 4.54 cm     TR Peak grad:   71.2 mmHg MV Decel Time: 167 msec     TR Vmax:        422.00 cm/s MV E velocity: 127.00 cm/s MV A velocity:  105.00 cm/s  SHUNTS MV E/A ratio:  1.21         Systemic VTI:  0.19 m                             Systemic Diam: 1.80 cm Ena Dawley MD Electronically signed by Ena Dawley MD Signature Date/Time: 08/29/2020/3:16:54 PM    Final     Medications: Infusions: . sodium chloride    . ferric gluconate (FERRLECIT/NULECIT) IV 250 mg (08/29/20 1435)    Scheduled Medications: . amLODipine  5 mg Oral Daily  . atorvastatin  10 mg Oral Daily  . furosemide  80 mg Intravenous Q8H  . gabapentin  100 mg Oral Daily  . heparin  5,000 Units Subcutaneous Q8H  . insulin aspart  0-9 Units Subcutaneous TID WC  . loratadine  10 mg Oral Daily  . metoprolol tartrate  50 mg Oral BID  . omega-3 acid ethyl esters  1 g Oral QHS  . potassium chloride  40 mEq Oral BID  . sodium chloride flush  3 mL Intravenous Q12H    have reviewed scheduled and prn medications.  Physical Exam: General:NAD, lying on bed comfortable Heart:RRR, s1s2 nl Lungs: Bibasal reduced breath sound, clear anteriorly Abdomen:soft, Non-tender, non-distended Extremities: Bilateral upper and lower extremities pitting edema present, anasarca, not much improved. Neurology: Alert awake and following commands  Leverne Tessler Tanna Furry 08/30/2020,10:21 AM  LOS: 2 days

## 2020-08-31 ENCOUNTER — Other Ambulatory Visit: Payer: Self-pay | Admitting: Student

## 2020-08-31 DIAGNOSIS — R601 Generalized edema: Secondary | ICD-10-CM | POA: Diagnosis not present

## 2020-08-31 LAB — GLUCOSE, CAPILLARY
Glucose-Capillary: 89 mg/dL (ref 70–99)
Glucose-Capillary: 82 mg/dL (ref 70–99)
Glucose-Capillary: 68 mg/dL — ABNORMAL LOW (ref 70–99)
Glucose-Capillary: 89 mg/dL (ref 70–99)

## 2020-08-31 MED ORDER — LORAZEPAM 2 MG/ML IJ SOLN: 0.5000 mg | Freq: Once | INTRAMUSCULAR | Status: DC

## 2020-08-31 MED ORDER — LORAZEPAM 1 MG PO TABS
1.0000 mg | ORAL_TABLET | Freq: Once | ORAL | Status: AC
  Administered 2020-08-31: 1 mg via ORAL
  Filled 2020-08-31: qty 1

## 2020-08-31 MED ORDER — DEXTROSE 50 % IV SOLN
25.0000 mL | Freq: Once | INTRAVENOUS | Status: AC
  Administered 2020-08-31: 25 mL via INTRAVENOUS

## 2020-08-31 MED ORDER — LORAZEPAM 0.5 MG PO TABS: 0.5000 mg | ORAL_TABLET | Freq: Once | ORAL | Status: DC

## 2020-08-31 MED ORDER — DEXTROSE 50 % IV SOLN
INTRAVENOUS | Status: AC
  Filled 2020-08-31: qty 50

## 2020-08-31 NOTE — Progress Notes (Signed)
OT Cancellation Note  Patient Details Name: Beatrice Heinzel MRN: [id number] DOB: 03-Nov-1960   Cancelled Treatment:    Reason Eval/Treat Not Completed: Other (comment) Per RN, pt going for kidney biopsy today and is anxious about this procedure. RN and pt request to hold on therapy eval until after procedure. Will follow-up again as time allows.   Layla Maw 08/31/2020, 7:14 AM

## 2020-08-31 NOTE — Evaluation (Signed)
Physical Therapy Evaluation Patient Details Name: [name] [name] MRN: [id number] DOB: 1961/03/01 Today's Date: 08/31/2020   History of Present Illness  60 year old gentleman with history of hypertension, hyperlipidemia, type 2 diabetes who is recently followed up for nephrotic syndrome with gross proteinuria as outpatient admitted with worsening lower extremity swelling and anasarca with fluid overload and AKI.  Sent from nephrology office.  Clinical Impression   PTA, pt typically lives alone, ambulatory with a cane and able to complete ADLs. In the last 3 weeks, pt has been staying at his mother's home, having increased difficulty with ADL completion and requiring use of RW. Pt also with recent hx of falls due to progressive weakness. Pt presents now with diagnoses above and deficits in strength, cardiopulmonary tolerance, standing balance, cognition, and endurance. With encouragement, pt able to demo bed mobility with Max A x 1-2, sit to stand at bedside with Mod A x 2 and take steps at bedside using RW; Hopeful for good functional progress as anasarca decreases, and kidney function improves; Still, with his heavy requirement fo rassist with basic bed mobiltiy and transfers, will need to consider SNF for post-acute rehab to maximize independence and safety with mobility prior to dc home.    Follow Up Recommendations SNF; If quick progress, will update to Home with [facility] therapy follow up    Equipment Recommendations  Rolling walker with 5" wheels;3in1 (PT) (consider rollator; may need supplememtal O2)    Recommendations for Other Services       Precautions / Restrictions Precautions Precautions: Fall;Other (comment) Precaution Comments: monitor O2      Mobility  Bed Mobility Overal bed mobility: Needs Assistance Bed Mobility: Supine to Sit;Sit to Supine     Supine to sit: Max assist;HOB elevated;+2 for safety/equipment Sit to supine: Max assist;+2 for safety/equipment   General bed  mobility comments: Difficulty initiating and sequencing task, multimodal cues to use bed rail, body positioning, etc    Transfers Overall transfer level: Needs assistance Equipment used: Rolling walker (2 wheeled) Transfers: Sit to/from Stand Sit to Stand: Mod assist;+2 physical assistance;+2 safety/equipment;From elevated surface         General transfer comment: Mod A x 2 for power up at bedside with RW, cues for hand placement and upright posture. Min A x 2 for side stepping at bedside, assist to maintain balance and advance RW  Ambulation/Gait Ambulation/Gait assistance: Min assist;Mod assist;+2 safety/equipment Gait Distance (Feet):  (Sidestpes toward EOB) Assistive device: Rolling walker (2 wheeled) Gait Pattern/deviations: Shuffle     General Gait Details: Cues for stepping, and to self-monitor for activity tolerance  Stairs            Wheelchair Mobility    Modified Rankin (Stroke Patients Only)       Balance     Sitting balance-Leahy Scale: Fair       Standing balance-Leahy Scale: Poor                               Pertinent Vitals/Pain Pain Assessment: Faces Faces Pain Scale: Hurts even more Pain Location: "crotch" (due to condom cath?) Pain Descriptors / Indicators: Grimacing;Sore Pain Intervention(s): Monitored during session    Home Living Family/patient expects to be discharged to:: Private residence Living Arrangements: Alone Available Help at Discharge: Family;Available PRN/intermittently Type of Home: House Home Access: Stairs to enter   [employer] of Steps: 1 step in back Home Layout: One level Home Equipment: Cane - single  point;Walker - 2 wheels Additional Comments: pt's mother lives next door and pt has been staying with her the last 3 weeks due to progressive weakness. Pt's mother has shower chair and 1 step to enter home. Pt's home has 5-7 steps to enter    Prior Function Level of Independence: Needs  assistance   Gait / Transfers Assistance Needed: typically uses cane for mobility, recently using RW due to weakness. endorses frequent falls  ADL's / Homemaking Assistance Needed: Reports typically independent with ADLs though has been having difficulty. Mother has been assisting with IADLs        Hand Dominance   Dominant Hand: Right    Extremity/Trunk Assessment   Upper Extremity Assessment Upper Extremity Assessment: Defer to OT evaluation    Lower Extremity Assessment Lower Extremity Assessment: Generalized weakness    Cervical / Trunk Assessment Cervical / Trunk Assessment: Kyphotic  Communication   Communication: No difficulties  Cognition Arousal/Alertness: Awake/alert Behavior During Therapy: Flat affect Overall Cognitive Status: Impaired/Different from baseline Area of Impairment: Attention;Following commands;Safety/judgement;Awareness;Problem solving                   Current Attention Level: Selective   Following Commands: Follows one step commands with increased time Safety/Judgement: Decreased awareness of safety;Decreased awareness of deficits Awareness: Emergent Problem Solving: Slow processing;Difficulty sequencing;Requires verbal cues;Decreased initiation;Requires tactile cues General Comments: Pt able to recall PLOF hx, follows commands with increased time though benefits from repetition of cues for safe hand placement, sequencing, etc. Decreased initiation of tasks, requiring tactile cues      General Comments General comments (skin integrity, edema, etc.): Pt received on 2 L O2, does not wear at baseline. SpO2 at rest on 3 L at 93%, 94% on RA trial. Sitting EOB, SpO2 at 83% on RA so therapists placed 2 L O2 back on with desat to 87% after mobilizing at bedside. Cues for pursed lip breathing. Mother present during session. Propped B UE up on pillows to decrease swelling    Exercises     Assessment/Plan    PT Assessment Patient needs  continued PT services  PT Problem List Decreased strength;Decreased range of motion;Decreased activity tolerance;Decreased balance;Decreased mobility;Decreased coordination;Decreased cognition;Decreased knowledge of use of DME;Decreased safety awareness       PT Treatment Interventions DME instruction;Gait training;Stair training;Functional mobility training;Therapeutic activities;Therapeutic exercise;Balance training;Cognitive remediation;Patient/family education    PT Goals (Current goals can be found in the Care Plan section)  Acute Rehab PT Goals Patient Stated Goal: decrease swelling, increase strength, figure out what is causing the swelling PT Goal Formulation: With patient Time For Goal Achievement: 09/14/20 Potential to Achieve Goals: Fair    Frequency Min 3X/week   Barriers to discharge        Co-evaluation PT/OT/SLP Co-Evaluation/Treatment: Yes Reason for Co-Treatment: For patient/therapist safety PT goals addressed during session: Mobility/safety with mobility         AM-PAC PT "6 Clicks" Mobility  Outcome Measure Help needed turning from your back to your side while in a flat bed without using bedrails?: A Lot Help needed moving from lying on your back to sitting on the side of a flat bed without using bedrails?: A Lot Help needed moving to and from a bed to a chair (including a wheelchair)?: A Lot Help needed standing up from a chair using your arms (e.g., wheelchair or bedside chair)?: A Lot Help needed to walk in hospital room?: A Lot Help needed climbing 3-5 steps with a railing? : A Lot 6  Click Score: 12    End of Session Equipment Utilized During Treatment: Gait belt Activity Tolerance: Patient tolerated treatment well Patient left: in bed;with call bell/phone within reach Nurse Communication: Mobility status PT Visit Diagnosis: Unsteadiness on feet (R26.81);Other abnormalities of gait and mobility (R26.89);Muscle weakness (generalized) (M62.81)     Time: [id number] PT Time Calculation (min) (ACUTE ONLY): 29 min   Charges:   PT Evaluation $PT Eval Moderate Complexity: 1 Mod          Roney [place], [place]  Acute Rehabilitation Services Pager [phone] Office [phone]   Colletta [place] 08/31/2020, 6:42 PM

## 2020-08-31 NOTE — Progress Notes (Signed)
PROGRESS NOTE    [name] [name]  [id number] DOB: June 17, 1960 DOA: 08/28/2020 PCP: [name], MD    Brief Narrative:  60 year old gentleman with history of hypertension, hyperlipidemia, type 2 diabetes who is recently followed up for nephrotic syndrome with gross proteinuria as outpatient admitted with worsening lower extremity swelling and anasarca with fluid overload and AKI.  Sent from nephrology office.   Assessment & Plan:   Principal Problem:   Anasarca Active Problems:   Uncontrolled type 2 diabetes mellitus with hyperglycemia (HCC)   Hyperlipidemia   Essential hypertension   Neuropathy   Nephrotic syndrome  Nephrotic syndrome with anasarca, nephrotic range proteinuria: 10.9 g protein in 24 hours.  Suspected diabetic nephropathy.  Currently with AKI and microscopic hematuria.  Outpatient serological testings negative so far. Scheduled for kidney biopsy today. Currently on IV Lasix and albumin, Intake and output monitoring.  Oxygen as needed. Mobilize.  Acute kidney injury: Probably due to reduced renal perfusion.  Holding lisinopril.  Followed by nephrology. Labs pending from today.  Multifactorial anemia: Currently stable.  Hypertension: On metoprolol, amlodipine and diuretics.  Hypokalemia, replaced.  Lab results pending from today.  Type 2 diabetes, uncontrolled with hyperglycemia: On glipizide at home.  Hemoglobin A1c 5.3.  Currently on sensitive sliding scale and is stable.   DVT prophylaxis: heparin injection 5,000 Units Start: 08/28/20 2200   Code Status: Full code. Family Communication: None.  He is talking to his mom. Disposition Plan: Status is: Inpatient  Remains inpatient appropriate because:Inpatient level of care appropriate due to severity of illness   Dispo: The patient is from: Home              Anticipated d/c is to: Home              Patient currently is not medically stable to d/c.   Difficult to place patient No          Consultants:   Nephrology  Procedures:   None  Antimicrobials:   None   Subjective: Patient seen and examined.  He was sleepy and then he said he is very nervous about procedure.  No other overnight events. Objective: Vitals:   08/30/20 1635 08/30/20 2022 08/31/20 0610 08/31/20 0929  BP: (!) 155/77 (!) 168/83 (!) 190/81 (!) 176/82  Pulse: 63 63 74 76  Resp: '18 18 18 18  '$ Temp: 97.6 F (36.4 C) 98.2 F (36.8 C) 97.8 F (36.6 C) 98 F (36.7 C)  TempSrc: Oral Oral Oral Oral  SpO2: 100% 97% 94% 96%    Intake/Output Summary (Last 24 hours) at 08/31/2020 1202 Last data filed at 08/31/2020 0900 Gross per 24 hour  Intake 560 ml  Output 1675 ml  Net -1115 ml   There were no vitals filed for this visit.  Examination:  General exam: Appears chronically sick looking.  Not in any distress.  Slight distress on talking. Respiratory system: Poor air entry at bases.  No added sounds.  Currently on oxygen SpO2: 96 % O2 Flow Rate (L/min): 2 L/min  Cardiovascular system: S1 & S2 heard, pansystolic murmur at apex. 2+ edema both legs.  1+ edema both arms. Gastrointestinal system: Nontender.  Bowel sounds present. Central nervous system: Alert and oriented. No focal neurological deficits. Extremities: Symmetric 5 x 5 power. Skin: No rashes, lesions or ulcers Psychiatry: Judgement and insight appear normal. Mood & affect appropriate.     Data Reviewed: I have personally reviewed following labs and imaging studies  CBC: Recent Labs  Lab 08/28/20  1030 08/28/20 1508 08/29/20 0250  WBC 6.8 7.2 6.1  HGB 9.7* 8.7* 8.2*  HCT 31.2* 27.8* 25.1*  MCV 93.1 92.7 89.0  PLT 334 287 [id number]   Basic Metabolic Panel: Recent Labs  Lab 08/28/20 1030 08/28/20 1508 08/29/20 0250 08/30/20 0308  NA 137  --  138 138  K 3.7  --  3.4* 4.6  CL 105  --  106 104  CO2 26  --  27 24  GLUCOSE 113*  --  93 105*  BUN 39*  --  38* 40*  CREATININE 2.43* 2.36* 2.31* 2.29*  CALCIUM 8.3*  --   8.3* 8.7*  PHOS  --   --   --  4.8*   GFR: CrCl cannot be calculated (Unknown ideal weight.). Liver Function Tests: Recent Labs  Lab 08/28/20 1223 08/29/20 0250 08/30/20 0308  AST 22 22  --   ALT 20 19  --   ALKPHOS 112 103  --   BILITOT 0.9 0.6  --   PROT 5.2* 5.3*  --   ALBUMIN 2.4* 2.8* 3.0*   No results for input(s): LIPASE, AMYLASE in the last 168 hours. No results for input(s): AMMONIA in the last 168 hours. Coagulation Profile: Recent Labs  Lab 08/30/20 0308  INR 1.1   Cardiac Enzymes: No results for input(s): CKTOTAL, CKMB, CKMBINDEX, TROPONINI in the last 168 hours. BNP (last 3 results) No results for input(s): PROBNP in the last 8760 hours. HbA1C: Recent Labs    08/28/20 1621  HGBA1C 5.3   CBG: Recent Labs  Lab 08/30/20 1138 08/30/20 1621 08/30/20 2118 08/31/20 0722 08/31/20 1122  GLUCAP 90 83 87 89 82   Lipid Profile: No results for input(s): CHOL, HDL, LDLCALC, TRIG, CHOLHDL, LDLDIRECT in the last 72 hours. Thyroid Function Tests: No results for input(s): TSH, T4TOTAL, FREET4, T3FREE, THYROIDAB in the last 72 hours. Anemia Panel: Recent Labs    08/28/20 1508 08/28/20 1510  VITAMINB12 318  --   FOLATE  --  6.7  FERRITIN 61  --   TIBC 287  --   IRON 35*  --    Sepsis Labs: No results for input(s): PROCALCITON, LATICACIDVEN in the last 168 hours.  Recent Results (from the past 240 hour(s))  SARS CORONAVIRUS 2 (TAT 6-24 HRS) Nasopharyngeal Nasopharyngeal Swab     Status: None   Collection Time: 08/28/20  2:12 PM   Specimen: Nasopharyngeal Swab  Result Value Ref Range Status   SARS Coronavirus 2 NEGATIVE NEGATIVE Final    Comment: (NOTE) SARS-CoV-2 target nucleic acids are NOT DETECTED.  The SARS-CoV-2 RNA is generally detectable in upper and lower respiratory specimens during the acute phase of infection. Negative results do not preclude SARS-CoV-2 infection, do not rule out co-infections with other pathogens, and should not be used  as the sole basis for treatment or other patient management decisions. Negative results must be combined with clinical observations, patient history, and epidemiological information. The expected result is Negative.  Fact Sheet for Patients: SugarRoll.be  Fact Sheet for Healthcare Providers: https://www.woods-mathews.com/  This test is not yet approved or cleared by the [place] FDA and  has been authorized for detection and/or diagnosis of SARS-CoV-2 by FDA under an Emergency Use Authorization (EUA). This EUA will remain  in effect (meaning this test can be used) for the duration of the COVID-19 declaration under Se ction 564(b)(1) of the Act, 21 U.S.C. section 360bbb-3(b)(1), unless the authorization is terminated or revoked sooner.  Performed at [facility]  [facility], [address], [place], [place] [address]          Radiology Studies: ECHOCARDIOGRAM COMPLETE  Result Date: 08/29/2020    ECHOCARDIOGRAM REPORT   Patient Name:   [name] [name] Date of Exam: 08/29/2020 Medical Rec #:  [id number]     Height:       67.8 in Accession #:    [id number]    Weight:       159.5 lb Date of Birth:  1961/05/23     BSA:          1.852 m Patient Age:    61 years      BP:           164/86 mmHg Patient Gender: M             HR:           55 bpm. Exam Location:  Inpatient Procedure: 2D Echo, Cardiac Doppler and Color Doppler Indications:    I51.7 Cardiomegaly  History:        Patient has no prior history of Echocardiogram examinations.                 Signs/Symptoms:Altered Mental Status.  Sonographer:    Merrie Roof Referring Phys: [id number] [place]  1. Left ventricular ejection fraction, by estimation, is 40 to 45%. The left ventricle has mildly decreased function. The left ventricle demonstrates global hypokinesis. Left ventricular diastolic parameters are consistent with Grade II diastolic dysfunction (pseudonormalization).  Elevated left ventricular end-diastolic pressure.  2. Right ventricular systolic function is moderately reduced. The right ventricular size is moderately enlarged. There is severely elevated pulmonary artery systolic pressure. The estimated right ventricular systolic pressure is 99991111 mmHg.  3. Left atrial size was moderately dilated.  4. Right atrial size was mildly dilated.  5. The pericardial effusion is circumferential. Large pleural effusion in the left lateral region.  6. The mitral valve is normal in structure. Moderate mitral valve regurgitation. No evidence of mitral stenosis.  7. Tricuspid valve regurgitation is moderate.  8. The aortic valve is normal in structure. Aortic valve regurgitation is not visualized. Mild to moderate aortic valve sclerosis/calcification is present, without any evidence of aortic stenosis.  9. The inferior vena cava is dilated in size with <50% respiratory variability, suggesting right atrial pressure of 15 mmHg. FINDINGS  Left Ventricle: Left ventricular ejection fraction, by estimation, is 40 to 45%. The left ventricle has mildly decreased function. The left ventricle demonstrates global hypokinesis. The left ventricular internal cavity size was normal in size. There is  no left ventricular hypertrophy. Left ventricular diastolic parameters are consistent with Grade II diastolic dysfunction (pseudonormalization). Elevated left ventricular end-diastolic pressure. Right Ventricle: The right ventricular size is moderately enlarged. No increase in right ventricular wall thickness. Right ventricular systolic function is moderately reduced. There is severely elevated pulmonary artery systolic pressure. The tricuspid regurgitant velocity is 4.22 m/s, and with an assumed right atrial pressure of 15 mmHg, the estimated right ventricular systolic pressure is 99991111 mmHg. Left Atrium: Left atrial size was moderately dilated. Right Atrium: Right atrial size was mildly dilated. Pericardium:  Trivial pericardial effusion is present. The pericardial effusion is circumferential. Mitral Valve: The mitral valve is normal in structure. There is mild thickening of the mitral valve leaflet(s). Moderate mitral valve regurgitation. No evidence of mitral valve stenosis. Tricuspid Valve: The tricuspid valve is normal in structure. Tricuspid valve regurgitation is moderate . No evidence of tricuspid stenosis. Aortic Valve: The aortic  valve is normal in structure. Aortic valve regurgitation is not visualized. Mild to moderate aortic valve sclerosis/calcification is present, without any evidence of aortic stenosis. Aortic valve mean gradient measures 4.0 mmHg. Aortic valve peak gradient measures 7.5 mmHg. Aortic valve area, by VTI measures 1.79 cm. Pulmonic Valve: The pulmonic valve was normal in structure. Pulmonic valve regurgitation is not visualized. No evidence of pulmonic stenosis. Aorta: The aortic root is normal in size and structure. Venous: The inferior vena cava is dilated in size with less than 50% respiratory variability, suggesting right atrial pressure of 15 mmHg. IAS/Shunts: No atrial level shunt detected by color flow Doppler. Additional Comments: There is a large pleural effusion in the left lateral region.  LEFT VENTRICLE PLAX 2D LVIDd:         5.00 cm  Diastology LVIDs:         4.20 cm  LV e' medial:    4.24 cm/s LV PW:         0.80 cm  LV E/e' medial:  30.0 LV IVS:        0.70 cm  LV e' lateral:   7.40 cm/s LVOT diam:     1.80 cm  LV E/e' lateral: 17.2 LV SV:         48 LV SV Index:   26 LVOT Area:     2.54 cm                          3D Volume EF:                         3D EF:        47 %                         LV EDV:       211 ml                         LV ESV:       112 ml                         LV SV:        99 ml RIGHT VENTRICLE             IVC RV Basal diam:  3.80 cm     IVC diam: 2.00 cm RV S prime:     11.90 cm/s TAPSE (M-mode): 2.8 cm LEFT ATRIUM              Index       RIGHT  ATRIUM           Index LA diam:        4.20 cm  2.27 cm/m  RA Area:     23.60 cm LA Vol (A2C):   142.0 ml 76.69 ml/m RA Volume:   78.30 ml  42.29 ml/m LA Vol (A4C):   86.4 ml  46.66 ml/m LA Biplane Vol: 116.0 ml 62.65 ml/m  AORTIC VALVE AV Area (Vmax):    1.71 cm AV Area (Vmean):   1.56 cm AV Area (VTI):     1.79 cm AV Vmax:           137.00 cm/s AV Vmean:          91.600 cm/s AV VTI:  0.269 m AV Peak Grad:      7.5 mmHg AV Mean Grad:      4.0 mmHg LVOT Vmax:         92.00 cm/s LVOT Vmean:        56.000 cm/s LVOT VTI:          0.189 m LVOT/AV VTI ratio: 0.70  AORTA Ao Root diam: 3.20 cm MITRAL VALVE                TRICUSPID VALVE MV Area (PHT): 4.54 cm     TR Peak grad:   71.2 mmHg MV Decel Time: 167 msec     TR Vmax:        422.00 cm/s MV E velocity: 127.00 cm/s MV A velocity: 105.00 cm/s  SHUNTS MV E/A ratio:  1.21         Systemic VTI:  0.19 m                             Systemic Diam: 1.80 cm Ena Dawley MD Electronically signed by Ena Dawley MD Signature Date/Time: 08/29/2020/3:16:54 PM    Final         Scheduled Meds: . amLODipine  10 mg Oral Daily  . atorvastatin  10 mg Oral Daily  . furosemide  80 mg Intravenous Q8H  . gabapentin  100 mg Oral Daily  . heparin  5,000 Units Subcutaneous Q8H  . insulin aspart  0-9 Units Subcutaneous TID WC  . loratadine  10 mg Oral Daily  . metoprolol tartrate  50 mg Oral BID  . omega-3 acid ethyl esters  1 g Oral QHS  . sodium chloride flush  3 mL Intravenous Q12H   Continuous Infusions: . sodium chloride       LOS: 3 days    Time spent: 30 minutes    Barb Merino, MD [facility] Pager [phone]

## 2020-08-31 NOTE — Progress Notes (Signed)
[name] [name]  Assessment/ Plan: Pt is a 60 y.o. yo male  With HTN, HLD, DM, recently seen at [facility] for nephrotic syndrome presents with worsening lower extremity edema/anasarca, seen as a consultation for the management of fluid overload and AKI.  #Nephrotic syndrome with anasarca: UA with 10.9 g of proteinuria.  He likely has diabetic nephropathy.  However with microscopic hematuria and AKI need to rule out underlying glomerular disease. Extensive serology test done as outpatient unremarkable, including ANA, ANCA, C3, C4, hepatitis B, C, HIV, RPR negative.  SPEP with no M spike and kappa lambda ratio was 0.95. IR consulted for kidney biopsy, plan for biopsy today. We will continue IV diuretics and albumin as he is responding well. Strict ins and out, monitor labs (AM labs pending)  #Acute kidney injury, nonoliguric: Probably hemodynamically mediated with reduced renal perfusion and ACE inhibitor.  Agree with holding lisinopril.  Diuretics to offload volume and to help renal perfusion.  Avoid nephrotoxins or hypotensive episode. Labs pending, bx today.  Continue diuretics as above.  #Anemia probably due to chronic disease and dilution: Iron saturation 12% therefore ordered IV iron.  Monitor hemoglobin.  #Hypertension: Blood pressure elevated therefore increase amlodipine to 10 mg.  Continue metoprolol and diuretics as above.  Continue to hold lisinopril.   #Hypokalemia: Repleted potassium chloride.  Monitor lab.  #Uncontrolled type 2 diabetes: On insulin per primary team.  Subjective: Seen and examined.  Urine output around 1.9L. Biopsy today which we discussed. No labs this am. No complaints as per patient.  Objective Vital signs in last 24 hours: Vitals:   08/30/20 1635 08/30/20 2022 08/31/20 0610 08/31/20 0929  BP: (!) 155/77 (!) 168/83 (!) 190/81 (!) 176/82  Pulse: 63 63 74 76  Resp: '18 18 18 18  '$ Temp: 97.6 F (36.4 C) 98.2 F  (36.8 C) 97.8 F (36.6 C) 98 F (36.7 C)  TempSrc: Oral Oral Oral Oral  SpO2: 100% 97% 94% 96%   Weight change:   Intake/Output Summary (Last 24 hours) at 08/31/2020 1227 Last data filed at 08/31/2020 0900 Gross per 24 hour  Intake 560 ml  Output 1675 ml  Net -1115 ml       Labs: Basic Metabolic Panel: Recent Labs  Lab 08/28/20 1030 08/28/20 1508 08/29/20 0250 08/30/20 0308  NA 137  --  138 138  K 3.7  --  3.4* 4.6  CL 105  --  106 104  CO2 26  --  27 24  GLUCOSE 113*  --  93 105*  BUN 39*  --  38* 40*  CREATININE 2.43* 2.36* 2.31* 2.29*  CALCIUM 8.3*  --  8.3* 8.7*  PHOS  --   --   --  4.8*   Liver Function Tests: Recent Labs  Lab 08/28/20 1223 08/29/20 0250 08/30/20 0308  AST 22 22  --   ALT 20 19  --   ALKPHOS 112 103  --   BILITOT 0.9 0.6  --   PROT 5.2* 5.3*  --   ALBUMIN 2.4* 2.8* 3.0*   No results for input(s): LIPASE, AMYLASE in the last 168 hours. No results for input(s): AMMONIA in the last 168 hours. CBC: Recent Labs  Lab 08/28/20 1030 08/28/20 1508 08/29/20 0250  WBC 6.8 7.2 6.1  HGB 9.7* 8.7* 8.2*  HCT 31.2* 27.8* 25.1*  MCV 93.1 92.7 89.0  PLT 334 287 264   Cardiac Enzymes: No results for input(s): CKTOTAL, CKMB, CKMBINDEX, TROPONINI in the  last 168 hours. CBG: Recent Labs  Lab 08/30/20 1138 08/30/20 1621 08/30/20 2118 08/31/20 0722 08/31/20 1122  GLUCAP 90 83 87 89 82    Iron Studies:  Recent Labs    08/28/20 1508  IRON 35*  TIBC 287  FERRITIN 61   Studies/Results: ECHOCARDIOGRAM COMPLETE  Result Date: 08/29/2020    ECHOCARDIOGRAM REPORT   Patient Name:   [name] [name] Date of Exam: 08/29/2020 Medical Rec #:  [id number]     Height:       67.8 in Accession #:    [id number]    Weight:       159.5 lb Date of Birth:  [birth year]     BSA:          1.852 m Patient Age:    45 years      BP:           164/86 mmHg Patient Gender: M             HR:           55 bpm. Exam Location:  Inpatient Procedure: 2D Echo, Cardiac Doppler  and Color Doppler Indications:    I51.7 Cardiomegaly  History:        Patient has no prior history of Echocardiogram examinations.                 Signs/Symptoms:Altered Mental Status.  Sonographer:    Merrie Roof Referring Phys: [id number] [place]  1. Left ventricular ejection fraction, by estimation, is 40 to 45%. The left ventricle has mildly decreased function. The left ventricle demonstrates global hypokinesis. Left ventricular diastolic parameters are consistent with Grade II diastolic dysfunction (pseudonormalization). Elevated left ventricular end-diastolic pressure.  2. Right ventricular systolic function is moderately reduced. The right ventricular size is moderately enlarged. There is severely elevated pulmonary artery systolic pressure. The estimated right ventricular systolic pressure is 99991111 mmHg.  3. Left atrial size was moderately dilated.  4. Right atrial size was mildly dilated.  5. The pericardial effusion is circumferential. Large pleural effusion in the left lateral region.  6. The mitral valve is normal in structure. Moderate mitral valve regurgitation. No evidence of mitral stenosis.  7. Tricuspid valve regurgitation is moderate.  8. The aortic valve is normal in structure. Aortic valve regurgitation is not visualized. Mild to moderate aortic valve sclerosis/calcification is present, without any evidence of aortic stenosis.  9. The inferior vena cava is dilated in size with <50% respiratory variability, suggesting right atrial pressure of 15 mmHg. FINDINGS  Left Ventricle: Left ventricular ejection fraction, by estimation, is 40 to 45%. The left ventricle has mildly decreased function. The left ventricle demonstrates global hypokinesis. The left ventricular internal cavity size was normal in size. There is  no left ventricular hypertrophy. Left ventricular diastolic parameters are consistent with Grade II diastolic dysfunction (pseudonormalization). Elevated left  ventricular end-diastolic pressure. Right Ventricle: The right ventricular size is moderately enlarged. No increase in right ventricular wall thickness. Right ventricular systolic function is moderately reduced. There is severely elevated pulmonary artery systolic pressure. The tricuspid regurgitant velocity is 4.22 m/s, and with an assumed right atrial pressure of 15 mmHg, the estimated right ventricular systolic pressure is 99991111 mmHg. Left Atrium: Left atrial size was moderately dilated. Right Atrium: Right atrial size was mildly dilated. Pericardium: Trivial pericardial effusion is present. The pericardial effusion is circumferential. Mitral Valve: The mitral valve is normal in structure. There is mild thickening of the mitral valve leaflet(s). Moderate mitral valve  regurgitation. No evidence of mitral valve stenosis. Tricuspid Valve: The tricuspid valve is normal in structure. Tricuspid valve regurgitation is moderate . No evidence of tricuspid stenosis. Aortic Valve: The aortic valve is normal in structure. Aortic valve regurgitation is not visualized. Mild to moderate aortic valve sclerosis/calcification is present, without any evidence of aortic stenosis. Aortic valve mean gradient measures 4.0 mmHg. Aortic valve peak gradient measures 7.5 mmHg. Aortic valve area, by VTI measures 1.79 cm. Pulmonic Valve: The pulmonic valve was normal in structure. Pulmonic valve regurgitation is not visualized. No evidence of pulmonic stenosis. Aorta: The aortic root is normal in size and structure. Venous: The inferior vena cava is dilated in size with less than 50% respiratory variability, suggesting right atrial pressure of 15 mmHg. IAS/Shunts: No atrial level shunt detected by color flow Doppler. Additional Comments: There is a large pleural effusion in the left lateral region.  LEFT VENTRICLE PLAX 2D LVIDd:         5.00 cm  Diastology LVIDs:         4.20 cm  LV e' medial:    4.24 cm/s LV PW:         0.80 cm  LV E/e'  medial:  30.0 LV IVS:        0.70 cm  LV e' lateral:   7.40 cm/s LVOT diam:     1.80 cm  LV E/e' lateral: 17.2 LV SV:         48 LV SV Index:   26 LVOT Area:     2.54 cm                          3D Volume EF:                         3D EF:        47 %                         LV EDV:       211 ml                         LV ESV:       112 ml                         LV SV:        99 ml RIGHT VENTRICLE             IVC RV Basal diam:  3.80 cm     IVC diam: 2.00 cm RV S prime:     11.90 cm/s TAPSE (M-mode): 2.8 cm LEFT ATRIUM              Index       RIGHT ATRIUM           Index LA diam:        4.20 cm  2.27 cm/m  RA Area:     23.60 cm LA Vol (A2C):   142.0 ml 76.69 ml/m RA Volume:   78.30 ml  42.29 ml/m LA Vol (A4C):   86.4 ml  46.66 ml/m LA Biplane Vol: 116.0 ml 62.65 ml/m  AORTIC VALVE AV Area (Vmax):    1.71 cm AV Area (Vmean):   1.56 cm AV Area (VTI):     1.79 cm AV Vmax:  137.00 cm/s AV Vmean:          91.600 cm/s AV VTI:            0.269 m AV Peak Grad:      7.5 mmHg AV Mean Grad:      4.0 mmHg LVOT Vmax:         92.00 cm/s LVOT Vmean:        56.000 cm/s LVOT VTI:          0.189 m LVOT/AV VTI ratio: 0.70  AORTA Ao Root diam: 3.20 cm MITRAL VALVE                TRICUSPID VALVE MV Area (PHT): 4.54 cm     TR Peak grad:   71.2 mmHg MV Decel Time: 167 msec     TR Vmax:        422.00 cm/s MV E velocity: 127.00 cm/s MV A velocity: 105.00 cm/s  SHUNTS MV E/A ratio:  1.21         Systemic VTI:  0.19 m                             Systemic Diam: 1.80 cm Ena Dawley MD Electronically signed by Ena Dawley MD Signature Date/Time: 08/29/2020/3:16:54 PM    Final     Medications: Infusions: . sodium chloride      Scheduled Medications: . amLODipine  10 mg Oral Daily  . atorvastatin  10 mg Oral Daily  . furosemide  80 mg Intravenous Q8H  . gabapentin  100 mg Oral Daily  . heparin  5,000 Units Subcutaneous Q8H  . insulin aspart  0-9 Units Subcutaneous TID WC  . loratadine  10 mg Oral Daily   . metoprolol tartrate  50 mg Oral BID  . omega-3 acid ethyl esters  1 g Oral QHS  . sodium chloride flush  3 mL Intravenous Q12H    have reviewed scheduled and prn medications.  Physical Exam: General:NAD, lying on bed comfortable Heart:RRR, s1s2 nl Lungs: Bibasal reduced breath sound, clear anteriorly Abdomen:soft, Non-tender, non-distended Extremities: Bilateral upper and lower extremities pitting edema present, anasarca, not much improved. Neurology: Alert awake and following commands  Gean Quint 08/31/2020,12:27 PM  LOS: 3 days

## 2020-08-31 NOTE — Evaluation (Signed)
Occupational Therapy Evaluation Patient Details Name: [name] [name] MRN: [id number] DOB: 1961/05/14 Today's Date: 08/31/2020    History of Present Illness 60 year old gentleman with history of hypertension, hyperlipidemia, type 2 diabetes who is recently followed up for nephrotic syndrome with gross proteinuria as outpatient admitted with worsening lower extremity swelling and anasarca with fluid overload and AKI.  Sent from nephrology office.   Clinical Impression   PTA, pt typically lives alone, ambulatory with a cane and able to complete ADLs. In the last 3 weeks, pt has been staying at his mother's home, having increased difficulty with ADL completion and requiring use of RW. Pt also with recent hx of falls due to progressive weakness. Pt presents now with diagnoses above and deficits in strength, cardiopulmonary tolerance, standing balance, cognition, and endurance. With encouragement, pt able to demo bed mobility with Max A x 1-2, sit to stand at bedside with Mod A x 2 and take steps at bedside using RW. Pt requires Min A for UB ADLs and Max A for LB ADLs due to deficits. Pt plans to discharge back to mother's home and anticipate pt to progress well once swelling decreases with HH therapy follow-up. If pt slow to progress and continues to require increased physical assist for ADLs/mobility, consider SNF for short term rehab.  Pt received on 2 L O2 and required 2 L to maintain SpO2 >87% during activities. Pt does not wear O2 at baseline.    Follow Up Recommendations  Home health OT;Supervision/Assistance - 24 hour (vs SNF if pt slow to progress)    Equipment Recommendations  3 in 1 bedside commode    Recommendations for Other Services       Precautions / Restrictions Precautions Precautions: Fall;Other (comment) Precaution Comments: monitor O2 Restrictions Weight Bearing Restrictions: No      Mobility Bed Mobility Overal bed mobility: Needs Assistance Bed Mobility: Supine to  Sit;Sit to Supine     Supine to sit: Max assist;HOB elevated;+2 for safety/equipment Sit to supine: Max assist;+2 for safety/equipment   General bed mobility comments: Difficulty initiating and sequencing task, multimodal cues to use bed rail, body positioning, etc    Transfers Overall transfer level: Needs assistance Equipment used: Rolling walker (2 wheeled) Transfers: Sit to/from Stand Sit to Stand: Mod assist;+2 physical assistance;+2 safety/equipment;From elevated surface         General transfer comment: Mod A x 2 for power up at bedside with RW, cues for hand placement and upright posture. Min A x 2 for side stepping at bedside, assist to maintain balance and advance RW    Balance Overall balance assessment: Needs assistance Sitting-balance support: Bilateral upper extremity supported;Feet supported Sitting balance-Leahy Scale: Fair     Standing balance support: Bilateral upper extremity supported;During functional activity Standing balance-Leahy Scale: Poor Standing balance comment: reliant on UE Support and external support                           ADL either performed or assessed with clinical judgement   ADL Overall ADL's : Needs assistance/impaired Eating/Feeding: NPO   Grooming: Min guard;Sitting   Upper Body Bathing: Minimal assistance;Sitting   Lower Body Bathing: Sit to/from stand;Sitting/lateral leans;Maximal assistance   Upper Body Dressing : Minimal assistance;Sitting   Lower Body Dressing: Maximal assistance;Sit to/from stand;Sitting/lateral leans   Toilet Transfer: Moderate assistance;Stand-pivot;RW   Toileting- Clothing Manipulation and Hygiene: Maximal assistance;Sit to/from stand;Sitting/lateral lean         General ADL Comments:  Limited by decreased endurance, strength, swelling and intermittent pain.     Vision Baseline Vision/History: Wears glasses Patient Visual Report: No change from baseline Vision Assessment?: No  apparent visual deficits     Perception     Praxis      Pertinent Vitals/Pain Pain Assessment: Faces Faces Pain Scale: Hurts even more Pain Location: "crotch" (due to condom cath?) Pain Descriptors / Indicators: Grimacing;Sore Pain Intervention(s): Monitored during session;Limited activity within patient's tolerance     Hand Dominance Right   Extremity/Trunk Assessment Upper Extremity Assessment Upper Extremity Assessment: RUE deficits/detail;LUE deficits/detail RUE Deficits / Details: mild edema noted around elbow/forearm LUE Deficits / Details: mild edema noted around elbow/forearm   Lower Extremity Assessment Lower Extremity Assessment: Defer to PT evaluation   Cervical / Trunk Assessment Cervical / Trunk Assessment: Kyphotic   Communication Communication Communication: No difficulties   Cognition Arousal/Alertness: Awake/alert Behavior During Therapy: Flat affect Overall Cognitive Status: Impaired/Different from baseline Area of Impairment: Attention;Following commands;Safety/judgement;Awareness;Problem solving                   Current Attention Level: Selective   Following Commands: Follows one step commands with increased time Safety/Judgement: Decreased awareness of safety;Decreased awareness of deficits Awareness: Emergent Problem Solving: Slow processing;Difficulty sequencing;Requires verbal cues;Decreased initiation;Requires tactile cues General Comments: Pt able to recall PLOF hx, follows commands with increased time though benefits from repetition of cues for safe hand placement, sequencing, etc. Decreased initiation of tasks, requiring tactile cues   General Comments  Pt received on 2 L O2, does not wear at baseline. SpO2 at rest on 3 L at 93%, 94% on RA trial. Sitting EOB, SpO2 at 83% on RA so therapists placed 2 L O2 back on with desat to 87% after mobilizing at bedside. Cues for pursed lip breathing. Mother present during session. Propped B UE up  on pillows to decrease swelling    Exercises     Shoulder Instructions      Home Living Family/patient expects to be discharged to:: Private residence Living Arrangements: Alone Available Help at Discharge: Family;Available PRN/intermittently Type of Home: House Home Access: Stairs to enter [employer] of Steps: 1 step in back   Home Layout: One level     Bathroom Shower/Tub: Walk-in shower;Tub/shower unit         Home Equipment: Kasandra Knudsen - single point;Walker - 2 wheels   Additional Comments: pt's mother lives next door and pt has been staying with her the last 3 weeks due to progressive weakness. Pt's mother has shower chair and 1 step to enter home. Pt's home has 5-7 steps to enter      Prior Functioning/Environment Level of Independence: Needs assistance  Gait / Transfers Assistance Needed: typically uses cane for mobility, recently using RW due to weakness. endorses frequent falls ADL's / Homemaking Assistance Needed: Reports typically independent with ADLs though has been having difficulty. Mother has been assisting with IADLs            OT Problem List: Decreased strength;Decreased activity tolerance;Impaired balance (sitting and/or standing);Decreased safety awareness;Decreased cognition;Decreased knowledge of use of DME or AE;Cardiopulmonary status limiting activity;Increased edema      OT Treatment/Interventions: Self-care/ADL training;Therapeutic exercise;Energy conservation;DME and/or AE instruction;Therapeutic activities;Balance training;Patient/family education    OT Goals(Current goals can be found in the care plan section) Acute Rehab OT Goals Patient Stated Goal: decrease swelling, increase strength, figure out what is causing the swelling OT Goal Formulation: With patient/family Time For Goal Achievement: 09/14/20 Potential to Achieve  Goals: Good ADL Goals Pt Will Perform Grooming: with supervision;standing Pt Will Perform Lower Body  Dressing: with supervision;sitting/lateral leans;sit to/from stand Pt Will Transfer to Toilet: with supervision;ambulating Pt Will Perform Toileting - Clothing Manipulation and hygiene: with supervision;sit to/from stand;sitting/lateral leans Additional ADL Goal #1: Pt to verbalize at least 3 energy conservation strategies to implement during daily tasks Additional ADL Goal #2: Pt to demo bed mobility at Supervision level as precursor to ADLs  OT Frequency: Min 2X/week   Barriers to D/C:            Co-evaluation PT/OT/SLP Co-Evaluation/Treatment: Yes Reason for Co-Treatment: For patient/therapist safety;To address functional/ADL transfers   OT goals addressed during session: ADL's and self-care      AM-PAC OT "6 Clicks" Daily Activity     Outcome Measure Help from another person eating meals?: Total (NPO) Help from another person taking care of personal grooming?: A Little Help from another person toileting, which includes using toliet, bedpan, or urinal?: A Lot Help from another person bathing (including washing, rinsing, drying)?: A Lot Help from another person to put on and taking off regular upper body clothing?: A Little Help from another person to put on and taking off regular lower body clothing?: A Lot 6 Click Score: 13   End of Session Equipment Utilized During Treatment: Gait belt;Rolling walker  Activity Tolerance: Patient limited by fatigue Patient left: in bed;with call bell/phone within reach;with bed alarm set;with family/visitor present  OT Visit Diagnosis: Unsteadiness on feet (R26.81);Other abnormalities of gait and mobility (R26.89);Muscle weakness (generalized) (M62.81);History of falling (Z91.81)                Time: [id number] OT Time Calculation (min): 29 min Charges:  OT General Charges $OT Visit: 1 Visit OT Evaluation $OT Eval Moderate Complexity: 1 Mod  Malachy Chamber, OTR/L Acute Rehab Services Office: [phone]  Layla Maw 08/31/2020, 3:03 PM

## 2020-09-01 ENCOUNTER — Inpatient Hospital Stay (HOSPITAL_COMMUNITY): Payer: Medicare HMO

## 2020-09-01 ENCOUNTER — Other Ambulatory Visit (HOSPITAL_COMMUNITY): Payer: Medicare HMO

## 2020-09-01 ENCOUNTER — Ambulatory Visit (HOSPITAL_COMMUNITY)
Admission: RE | Admit: 2020-09-01 | Discharge: 2020-09-01 | Disposition: A | Discharge status: Home / Self Care | Payer: Medicare HMO | Source: Ambulatory Visit | Attending: Nephrology | Admitting: Nephrology

## 2020-09-01 DIAGNOSIS — R601 Generalized edema: Secondary | ICD-10-CM | POA: Diagnosis not present

## 2020-09-01 LAB — CBC
HCT: 26.7 % — ABNORMAL LOW (ref 39.0–52.0)
Hemoglobin: 9 g/dL — ABNORMAL LOW (ref 13.0–17.0)
MCH: 29.7 pg (ref 26.0–34.0)
MCHC: 33.7 g/dL (ref 30.0–36.0)
MCV: 88.1 fL (ref 80.0–100.0)
Platelets: 298 10*3/uL (ref 150–400)
RBC: 3.03 MIL/uL — ABNORMAL LOW (ref 4.22–5.81)
RDW: 13.1 % (ref 11.5–15.5)
WBC: 7.1 10*3/uL (ref 4.0–10.5)
nRBC: 0 % (ref 0.0–0.2)

## 2020-09-01 LAB — RENAL FUNCTION PANEL
Albumin: 2.8 g/dL — ABNORMAL LOW (ref 3.5–5.0)
Anion gap: 8 (ref 5–15)
BUN: 40 mg/dL — ABNORMAL HIGH (ref 8–23)
CO2: 25 mmol/L (ref 22–32)
Calcium: 8.7 mg/dL — ABNORMAL LOW (ref 8.9–10.3)
Chloride: 104 mmol/L (ref 98–111)
Creatinine, Ser: 2.22 mg/dL — ABNORMAL HIGH (ref 0.61–1.24)
GFR, Estimated: 33 mL/min — ABNORMAL LOW (ref >60)
Glucose, Bld: 86 mg/dL (ref 70–99)
Phosphorus: 5 mg/dL — ABNORMAL HIGH (ref 2.5–4.6)
Potassium: 4 mmol/L (ref 3.5–5.1)
Sodium: 137 mmol/L (ref 135–145)

## 2020-09-01 LAB — GLUCOSE, CAPILLARY
Glucose-Capillary: 82 mg/dL (ref 70–99)
Glucose-Capillary: 92 mg/dL (ref 70–99)
Glucose-Capillary: 162 mg/dL — ABNORMAL HIGH (ref 70–99)
Glucose-Capillary: 146 mg/dL — ABNORMAL HIGH (ref 70–99)

## 2020-09-01 MED ORDER — HYDRALAZINE HCL 20 MG/ML IJ SOLN
5.0000 mg | Freq: Once | INTRAMUSCULAR | Status: AC
  Administered 2020-09-01: 5 mg via INTRAVENOUS

## 2020-09-01 MED ORDER — HEPARIN SODIUM (PORCINE) 5000 UNIT/ML IJ SOLN
5000.0000 [IU] | Freq: Three times a day (TID) | INTRAMUSCULAR | Status: DC
  Administered 2020-09-02 – 2020-09-03 (×2): 5000 [IU] via SUBCUTANEOUS
  Filled 2020-09-01 (×3): qty 1

## 2020-09-01 MED ORDER — LIDOCAINE HCL (PF) 1 % IJ SOLN
INTRAMUSCULAR | Status: AC
  Filled 2020-09-01: qty 30

## 2020-09-01 MED ORDER — GELATIN ABSORBABLE 12-7 MM EX MISC
CUTANEOUS | Status: AC
  Filled 2020-09-01: qty 1

## 2020-09-01 MED ORDER — MIDAZOLAM HCL 2 MG/2ML IJ SOLN
INTRAMUSCULAR | Status: AC
  Filled 2020-09-01: qty 2

## 2020-09-01 MED ORDER — CHLORTHALIDONE 25 MG PO TABS
25.0000 mg | ORAL_TABLET | Freq: Every day | ORAL | Status: DC
  Administered 2020-09-01 – 2020-09-07 (×7): 25 mg via ORAL
  Filled 2020-09-01 (×9): qty 1

## 2020-09-01 MED ORDER — FENTANYL CITRATE (PF) 100 MCG/2ML IJ SOLN
INTRAMUSCULAR | Status: AC
  Filled 2020-09-01: qty 2

## 2020-09-01 MED ORDER — HYDRALAZINE HCL 20 MG/ML IJ SOLN
INTRAMUSCULAR | Status: AC
  Filled 2020-09-01: qty 1

## 2020-09-01 MED ORDER — HYDRALAZINE HCL 25 MG PO TABS: 25.0000 mg | ORAL_TABLET | Freq: Three times a day (TID) | ORAL | Status: DC

## 2020-09-01 NOTE — Progress Notes (Signed)
Physical Therapy Treatment Patient Details Name: [name] [name] MRN: [id number] DOB: [birth year] Today's Date: 09/01/2020    History of Present Illness 60 year old gentleman with history of hypertension, hyperlipidemia, type 2 diabetes who is recently followed up for nephrotic syndrome with gross proteinuria as outpatient admitted with worsening lower extremity swelling and anasarca with fluid overload and AKI.  Sent from nephrology office.    PT Comments    Continuing work on functional mobility and activity tolerance;  Required lots of encouragement to participate; Supported on supplemental O2 3 liters throughout session; Not much different than last session, Requiring Mod/Max assist to sit up EOB and transfer OOB to recliner; Feel that we must consider SNF for post-acute rehab to maximize independence and safety with mobility prior to dc home  Follow Up Recommendations  SNF     Equipment Recommendations  Rolling walker with 5" wheels;3in1 (PT) (consider rollator; may need supplememtal O2)    Recommendations for Other Services       Precautions / Restrictions Precautions Precautions: Fall;Other (comment) Precaution Comments: monitor O2    Mobility  Bed Mobility Overal bed mobility: Needs Assistance Bed Mobility: Supine to Sit     Supine to sit: Max assist;HOB elevated;+2 for safety/equipment     General bed mobility comments: Difficulty initiating and sequencing task, multimodal cues to use bed rail, body positioning, etc    Transfers Overall transfer level: Needs assistance Equipment used: 1 person hand held assist (second person present for safety) Transfers: Sit to/from [facility] Sit to Stand: Mod assist;+2 physical assistance;+2 safety/equipment;From elevated surface Stand pivot transfers: Mod assist       General transfer comment: Mod A x 2 for power up at bedside; Declined RW today, and performed a stand pivot transfer OOB to recliner on pt's R  with Mod assist; Max assist to control descent to sit  Ambulation/Gait             General Gait Details: Refused walking   Stairs             Wheelchair Mobility    Modified Rankin (Stroke Patients Only)       Balance     Sitting balance-Leahy Scale: Fair       Standing balance-Leahy Scale: Poor                              Cognition Arousal/Alertness: Awake/alert Behavior During Therapy: Flat affect Overall Cognitive Status: Impaired/Different from baseline Area of Impairment: Attention;Following commands;Safety/judgement;Awareness;Problem solving                   Current Attention Level: Selective   Following Commands: Follows one step commands with increased time Safety/Judgement: Decreased awareness of safety;Decreased awareness of deficits Awareness: Emergent Problem Solving: Slow processing;Difficulty sequencing;Requires verbal cues;Decreased initiation;Requires tactile cues        Exercises      General Comments General comments (skin integrity, edema, etc.): Suppoerted pt on 3 L of supplemental O2 throughout session      Pertinent Vitals/Pain Pain Assessment: Faces Faces Pain Scale: Hurts even more Pain Location: "crotch" (due to condom cath?) Pain Descriptors / Indicators: Grimacing;Sore Pain Intervention(s): Monitored during session    Home Living                      Prior Function            PT Goals (current goals can now be found  in the care plan section) Acute Rehab PT Goals Patient Stated Goal: decrease swelling, increase strength, figure out what is causing the swelling PT Goal Formulation: With patient Time For Goal Achievement: 09/14/20 Potential to Achieve Goals: Fair Progress towards PT goals: Progressing toward goals (Slowly)    Frequency    Min 3X/week      PT Plan Discharge plan needs to be updated    Co-evaluation              AM-PAC PT "6 Clicks" Mobility    Outcome Measure  Help needed turning from your back to your side while in a flat bed without using bedrails?: A Lot Help needed moving from lying on your back to sitting on the side of a flat bed without using bedrails?: A Lot Help needed moving to and from a bed to a chair (including a wheelchair)?: A Lot Help needed standing up from a chair using your arms (e.g., wheelchair or bedside chair)?: A Lot Help needed to walk in hospital room?: A Lot Help needed climbing 3-5 steps with a railing? : A Lot 6 Click Score: 12    End of Session Equipment Utilized During Treatment: Gait belt;Oxygen Activity Tolerance: Patient tolerated treatment well Patient left: in chair;with call bell/phone within reach;with family/visitor present Nurse Communication: Mobility status;Other (comment) (and consider Stedy fo rback to bed) PT Visit Diagnosis: Unsteadiness on feet (R26.81);Other abnormalities of gait and mobility (R26.89);Muscle weakness (generalized) (M62.81)     Time: [id number] PT Time Calculation (min) (ACUTE ONLY): 27 min  Charges:  $Therapeutic Activity: 23-37 mins                     Roney [place], PT  Acute Rehabilitation Services Pager [phone] Office [phone]    [name] [name] 09/01/2020, 7:20 PM

## 2020-09-01 NOTE — Progress Notes (Signed)
PROGRESS NOTE    [name] [name]  [id number] DOB: 12/25/1960 DOA: 08/28/2020 PCP: [name], MD    Brief Narrative:  60 year old gentleman with history of hypertension, hyperlipidemia, type 2 diabetes who is recently followed up for nephrotic syndrome with gross proteinuria as outpatient admitted with worsening lower extremity swelling and anasarca with fluid overload and AKI.  Sent from nephrology office for biopsy and iv diuresis.   Assessment & Plan:   Principal Problem:   Anasarca Active Problems:   Uncontrolled type 2 diabetes mellitus with hyperglycemia (HCC)   Hyperlipidemia   Essential hypertension   Neuropathy   Nephrotic syndrome  Nephrotic syndrome with anasarca, nephrotic range proteinuria: 10.9 g protein in 24 hours.  Suspected diabetic nephropathy.  Currently with AKI and microscopic hematuria.  Outpatient serological testings negative so far. Currently on IV Lasix and albumin, Intake and output monitoring.  Oxygen as needed. Kidney biopsy is unsuccessful today, initially with hypertension.  Later on he said he could not lay on his belly and developed shortness of breath.  Very anxious. May continue to work on more fluid removal to reattempt kidney biopsy.  Acute kidney injury: Probably due to reduced renal perfusion.  Holding lisinopril.  Followed by nephrology. his kidney functions remain about the same.  Urine output is adequate. 1500 mL last 24 hours.  Multifactorial anemia: Currently stable.  Hypertension: On metoprolol, amlodipine and diuretics.  As needed hydralazine.  Hypokalemia, replaced.  Adequate.  Type 2 diabetes, uncontrolled with hyperglycemia: On glipizide at home.  Hemoglobin A1c 5.3.  Currently on sensitive sliding scale and is stable.   DVT prophylaxis: heparin injection 5,000 Units Start: 09/02/20 0600   Code Status: Full code. Family Communication: None.  He is talking to his mom. Disposition Plan: Status is: Inpatient  Remains  inpatient appropriate because:Inpatient level of care appropriate due to severity of illness   Dispo: The patient is from: Home              Anticipated d/c is to: Home              Patient currently is not medically stable to d/c.   Difficult to place patient No         Consultants:   Nephrology  Procedures:   None  Antimicrobials:   None   Subjective: Patient seen and examined.  No overnight events.  Is very anxious for procedure in the morning. He wanted to get it done.  Leg swelling is better.  Breathing is better.   Objective: Vitals:   08/31/20 1956 09/01/20 0437 09/01/20 0938 09/01/20 1105  BP: (!) 180/80 (!) 171/80 (!) 162/75 (!) 159/70  Pulse: 63 (!) 58 67 (!) 56  Resp: '16 17 18 18  '$ Temp: 97.7 F (36.5 C) 97.6 F (36.4 C) 97.8 F (36.6 C) (!) 97.4 F (36.3 C)  TempSrc: Oral Oral  Oral  SpO2: 97% 98% 96% 97%  Weight:  89.6 kg      Intake/Output Summary (Last 24 hours) at 09/01/2020 1335 Last data filed at 09/01/2020 [id number] Gross per 24 hour  Intake 200 ml  Output 900 ml  Net -700 ml   Filed Weights   09/01/20 0437  Weight: 89.6 kg    Examination:  General exam: Frail and debilitated.  Not in any distress.  Anxious.  On 1 to 2 L of oxygen. Respiratory system: Poor air entry at bases.  No added sounds.  Currently on oxygen SpO2: 97 % O2 Flow Rate (L/min): 1.5 L/min  Cardiovascular system: S1 & S2 heard, pansystolic murmur at apex. 1+ bilateral pedal edema.  Much improved than before.   Gastrointestinal system: Nontender.  Bowel sounds present. Central nervous system: Alert and oriented. No focal neurological deficits. Extremities: Symmetric 5 x 5 power. Skin: No rashes, lesions or ulcers Psychiatry: Judgement and insight appear normal. Mood & affect appropriate.     Data Reviewed: I have personally reviewed following labs and imaging studies  CBC: Recent Labs  Lab 08/28/20 1030 08/28/20 1508 08/29/20 0250 09/01/20 0735  WBC 6.8  7.2 6.1 7.1  HGB 9.7* 8.7* 8.2* 9.0*  HCT 31.2* 27.8* 25.1* 26.7*  MCV 93.1 92.7 89.0 88.1  PLT 334 287 264 [id number]   Basic Metabolic Panel: Recent Labs  Lab 08/28/20 1030 08/28/20 1508 08/29/20 0250 08/30/20 0308 09/01/20 0735  NA 137  --  138 138 137  K 3.7  --  3.4* 4.6 4.0  CL 105  --  106 104 104  CO2 26  --  '27 24 25  '$ GLUCOSE 113*  --  93 105* 86  BUN 39*  --  38* 40* 40*  CREATININE 2.43* 2.36* 2.31* 2.29* 2.22*  CALCIUM 8.3*  --  8.3* 8.7* 8.7*  PHOS  --   --   --  4.8* 5.0*   GFR: Estimated Creatinine Clearance: 37.8 mL/min (A) (by C-G formula based on SCr of 2.22 mg/dL (H)). Liver Function Tests: Recent Labs  Lab 08/28/20 1223 08/29/20 0250 08/30/20 0308 09/01/20 0735  AST 22 22  --   --   ALT 20 19  --   --   ALKPHOS 112 103  --   --   BILITOT 0.9 0.6  --   --   PROT 5.2* 5.3*  --   --   ALBUMIN 2.4* 2.8* 3.0* 2.8*   No results for input(s): LIPASE, AMYLASE in the last 168 hours. No results for input(s): AMMONIA in the last 168 hours. Coagulation Profile: Recent Labs  Lab 08/30/20 0308  INR 1.1   Cardiac Enzymes: No results for input(s): CKTOTAL, CKMB, CKMBINDEX, TROPONINI in the last 168 hours. BNP (last 3 results) No results for input(s): PROBNP in the last 8760 hours. HbA1C: No results for input(s): HGBA1C in the last 72 hours. CBG: Recent Labs  Lab 08/31/20 1122 08/31/20 1646 08/31/20 2124 09/01/20 0624 09/01/20 1225  GLUCAP 82 68* 89 82 92   Lipid Profile: No results for input(s): CHOL, HDL, LDLCALC, TRIG, CHOLHDL, LDLDIRECT in the last 72 hours. Thyroid Function Tests: No results for input(s): TSH, T4TOTAL, FREET4, T3FREE, THYROIDAB in the last 72 hours. Anemia Panel: No results for input(s): VITAMINB12, FOLATE, FERRITIN, TIBC, IRON, RETICCTPCT in the last 72 hours. Sepsis Labs: No results for input(s): PROCALCITON, LATICACIDVEN in the last 168 hours.  Recent Results (from the past 240 hour(s))  SARS CORONAVIRUS 2 (TAT 6-24 HRS)  Nasopharyngeal Nasopharyngeal Swab     Status: None   Collection Time: 08/28/20  2:12 PM   Specimen: Nasopharyngeal Swab  Result Value Ref Range Status   SARS Coronavirus 2 NEGATIVE NEGATIVE Final    Comment: (NOTE) SARS-CoV-2 target nucleic acids are NOT DETECTED.  The SARS-CoV-2 RNA is generally detectable in upper and lower respiratory specimens during the acute phase of infection. Negative results do not preclude SARS-CoV-2 infection, do not rule out co-infections with other pathogens, and should not be used as the sole basis for treatment or other patient management decisions. Negative results must be combined with clinical observations, patient history,  and epidemiological information. The expected result is Negative.  Fact Sheet for Patients: SugarRoll.be  Fact Sheet for Healthcare Providers: https://www.woods-mathews.com/  This test is not yet approved or cleared by the [place] FDA and  has been authorized for detection and/or diagnosis of SARS-CoV-2 by FDA under an Emergency Use Authorization (EUA). This EUA will remain  in effect (meaning this test can be used) for the duration of the COVID-19 declaration under Se ction 564(b)(1) of the Act, 21 U.S.C. section 360bbb-3(b)(1), unless the authorization is terminated or revoked sooner.  Performed at [facility], [place] [address], [place], [place] [address]          Radiology Studies: No results found.      Scheduled Meds: . amLODipine  10 mg Oral Daily  . atorvastatin  10 mg Oral Daily  . chlorthalidone  25 mg Oral Daily  . furosemide  80 mg Intravenous Q8H  . gabapentin  100 mg Oral Daily  . [START ON 09/02/2020] heparin  5,000 Units Subcutaneous Q8H  . hydrALAZINE      . insulin aspart  0-9 Units Subcutaneous TID WC  . lidocaine (PF)      . lidocaine (PF)      . loratadine  10 mg Oral Daily  . metoprolol tartrate  50 mg Oral BID  . omega-3 acid ethyl  esters  1 g Oral QHS  . sodium chloride flush  3 mL Intravenous Q12H   Continuous Infusions: . sodium chloride       LOS: 4 days    Time spent: 30 minutes    Barb Merino, MD [facility] Pager [phone]

## 2020-09-01 NOTE — Progress Notes (Signed)
[facility] NEPHROLOGY PROGRESS NOTE  Assessment/ Plan: Pt is a 60 y.o. yo male  With HTN, HLD, DM, recently seen at [facility] for nephrotic syndrome presents with worsening lower extremity edema/anasarca, seen as a consultation for the management of fluid overload and AKI.  #Nephrotic syndrome with anasarca: UA with 10.9 g of proteinuria.  He likely has diabetic nephropathy but there is a concern for podocytopathy.  However with microscopic hematuria and AKI need to rule out underlying glomerular disease. Extensive serology test done as outpatient unremarkable, including ANA, ANCA, C3, C4, hepatitis B, C, HIV, RPR negative.  SPEP with no M spike and kappa lambda ratio was 0.95. -continue with lasix '80mg'$  Q8H -daily weights ordered, strict I/O -IR consulted for kidney biopsy, planded for biopsy today however his resp status and BP were limiting factors therefore procedure aborted. reattempt tomorrow, in the interim will try to off-load fluid and control his BP -will continue IV diuretics and albumin as he is responding well. Strict ins and out, monitor daily labs  #Acute kidney injury, nonoliguric: Probably hemodynamically mediated with reduced renal perfusion and ACE inhibitor.  Agree with holding lisinopril.  Diuretics to offload volume and to help renal perfusion.  Cr unchanged. I am suspecting that this is actually his new baseline function but time will tell Avoid nephrotoxins or hypotensive episode. Continue diuretics as above.  #Anemia probably due to chronic disease and dilution: Iron saturation 12% therefore ordered IV iron.  Monitor hemoglobin.  #Hypertension: Starting chlorthalidone '25mg'$  daily, continue with norvasc '10mg'$  daily, lopressor '50mg'$  BID (titration limited by HR). Will plan on giving anti-hypertensives prior to being sent down  #Hypokalemia: replete prn. k has been stable  #Uncontrolled type 2 diabetes: per primary team.  Subjective: Seen and  examined bedside. Was down for biopsy today but procedure aborted due to uncontrolled HTN and then patient reported that he could not breathe. Received IV hydralazine with no improvement in BP therefore sent back to his room with a possible reattempt tomorrow. Patient reports that he feels better now, not having much SOB especially with  in place. Spoke with mom at the bedside.  Objective Vital signs in last 24 hours: Vitals:   08/31/20 1956 09/01/20 0437 09/01/20 0938 09/01/20 1105  BP: (!) 180/80 (!) 171/80 (!) 162/75 (!) 159/70  Pulse: 63 (!) 58 67 (!) 56  Resp: '16 17 18 18  '$ Temp: 97.7 F (36.5 C) 97.6 F (36.4 C) 97.8 F (36.6 C) (!) 97.4 F (36.3 C)  TempSrc: Oral Oral  Oral  SpO2: 97% 98% 96% 97%  Weight:  89.6 kg     Weight change:   Intake/Output Summary (Last 24 hours) at 09/01/2020 1318 Last data filed at 09/01/2020 [id number] Gross per 24 hour  Intake 200 ml  Output 1155 ml  Net -955 ml       Labs: Basic Metabolic Panel: Recent Labs  Lab 08/29/20 0250 08/30/20 0308 09/01/20 0735  NA 138 138 137  K 3.4* 4.6 4.0  CL 106 104 104  CO2 '27 24 25  '$ GLUCOSE 93 105* 86  BUN 38* 40* 40*  CREATININE 2.31* 2.29* 2.22*  CALCIUM 8.3* 8.7* 8.7*  PHOS  --  4.8* 5.0*   Liver Function Tests: Recent Labs  Lab 08/28/20 1223 08/29/20 0250 08/30/20 0308 09/01/20 0735  AST 22 22  --   --   ALT 20 19  --   --   ALKPHOS 112 103  --   --   BILITOT  0.9 0.6  --   --   PROT 5.2* 5.3*  --   --   ALBUMIN 2.4* 2.8* 3.0* 2.8*   No results for input(s): LIPASE, AMYLASE in the last 168 hours. No results for input(s): AMMONIA in the last 168 hours. CBC: Recent Labs  Lab 08/28/20 1030 08/28/20 1508 08/29/20 0250 09/01/20 0735  WBC 6.8 7.2 6.1 7.1  HGB 9.7* 8.7* 8.2* 9.0*  HCT 31.2* 27.8* 25.1* 26.7*  MCV 93.1 92.7 89.0 88.1  PLT 334 287 264 298   Cardiac Enzymes: No results for input(s): CKTOTAL, CKMB, CKMBINDEX, TROPONINI in the last 168 hours. CBG: Recent Labs  Lab  08/31/20 1122 08/31/20 1646 08/31/20 2124 09/01/20 0624 09/01/20 1225  GLUCAP 82 68* 89 82 92    Iron Studies:  No results for input(s): IRON, TIBC, TRANSFERRIN, FERRITIN in the last 72 hours. Studies/Results: No results found.  Medications: Infusions: . sodium chloride      Scheduled Medications: . amLODipine  10 mg Oral Daily  . atorvastatin  10 mg Oral Daily  . furosemide  80 mg Intravenous Q8H  . gabapentin  100 mg Oral Daily  . [START ON 09/02/2020] heparin  5,000 Units Subcutaneous Q8H  . hydrALAZINE      . insulin aspart  0-9 Units Subcutaneous TID WC  . lidocaine (PF)      . lidocaine (PF)      . loratadine  10 mg Oral Daily  . metoprolol tartrate  50 mg Oral BID  . omega-3 acid ethyl esters  1 g Oral QHS  . sodium chloride flush  3 mL Intravenous Q12H    have reviewed scheduled and prn medications.  Physical Exam: General:NAD, lying on bed comfortable Heart:RRR, s1s2 nl Lungs: Bibasal reduced breath sound, clear anteriorly, on [place] Abdomen:soft, Non-tender, non-distended Extremities: Bilateral upper and lower extremities pitting edema present, anasarca, not much improved. Neurology: Alert awake and following commands  [name] 09/01/2020,1:18 PM  LOS: 4 days

## 2020-09-01 NOTE — Plan of Care (Signed)
  Problem: Education: Goal: Knowledge of General Education information will improve Description: Including pain rating scale, medication(s)/side effects and non-pharmacologic comfort measures Outcome: Progressing   Problem: Safety: Goal: Ability to remain free from injury will improve Outcome: Progressing   

## 2020-09-01 NOTE — TOC Initial Note (Signed)
Transition of Care [facility]) - Initial/Assessment Note    Patient Details  Name: [name] [name] MRN: [id number] Date of Birth: July 14, 1960  [facility]) CM/SW Contact:    [name] Crews, RN Phone Number: [phone] 09/01/2020, 5:19 PM  Clinical Narrative:                  Spoke with patient and mom, [name] [name], at the bedside. PTA home alone - lives next door to his mom. Will go to mom's house (house number [phone]) at discharge. Discussed home health PT/OT recommendations - offered choice. Referral accepted by [facility]. Patient will need home health orders for PT/OT with Face to Face at discharge. Noted recommendations for 3N1 and RW - patient currently on oxygen, will follow for home oxygen needs. TOC following for transition needs.    Barriers to Discharge: Continued Medical Work up   Patient Goals and CMS Choice Patient states their goals for this hospitalization and ongoing recovery are:: return home CMS Medicare.gov Compare Post Acute Care list provided to:: Patient Choice offered to / list presented to : Patient,Parent  Expected Discharge Plan and Services     Discharge Planning Services: CM Consult Post Acute Care Choice: [place] arrangements for the past 2 months: [place]: OT,PT [place]: [place] Date [place]: 09/01/20 Time [place]: [place] Representative spoke with at [place]: [name] [name]  Prior Living Arrangements/Services Living arrangements for the past 2 months: [place] with:: Self Patient language and need for interpreter reviewed:: Yes Do you feel safe going back to the place where you live?: Yes      Need for Family Participation in Patient Care: Yes (Comment) Care giver support system in place?: Yes (comment)   Criminal Activity/Legal Involvement Pertinent to Current Situation/Hospitalization: No - Comment as needed  Activities of Daily  Living      Permission Sought/Granted Permission sought to share information with : Family Supports Permission granted to share information with : Yes, Verbal Permission Granted  Share Information with NAME: [name] [name]     Permission granted to share info w Relationship: mother  Permission granted to share info w Contact Information: [phone]  Emotional Assessment Appearance:: Appears stated age Attitude/Demeanor/Rapport: Engaged Affect (typically observed): Accepting Orientation: : Oriented to Self,Oriented to  Time,Oriented to Place,Oriented to Situation Alcohol / Substance Use: Not Applicable Psych Involvement: No (comment)  Admission diagnosis:  Anasarca [R60.1] Patient Active Problem List   Diagnosis Date Noted  . Anasarca 08/28/2020  . Nephrotic syndrome 08/28/2020  . Neuropathy 07/12/2018  . Osteomyelitis of great toe of right foot ([place]) 07/12/2018  . Essential hypertension 02/14/2018  . Uncontrolled type 2 diabetes mellitus with hyperglycemia ([place]) 01/08/2018  . Hyperlipidemia 01/08/2018   PCP:  [name] Pepper, MD Pharmacy:   [place], [place] - [address] [place] [place] [address] Phone: [phone] Fax: [phone]  [facility], [place], [place] [address], [place] [address] Phone: [phone] Fax: [phone]     Social Determinants of Health (SDOH) Interventions    Readmission Risk Interventions No flowsheet data found.

## 2020-09-01 NOTE — Progress Notes (Signed)
Second attempt in ultrasound to obtain kidney biopsy. Patient was down this morning however was hypertensive and given IV meds without successful biopsy. Attempted to have patient lie prone on his belly and needed some additional help. Patient immediately states "he could not do it, and that he could not breath". Bumped his oxygen from 2-4 liters as he desaturated into low 80s and was cyanotic in the face. Spoke with Dr. Annamaria Boots who recommends sending patient back upstairs for today. No sedation give. Reported to floor RN-candice of attempt in ultrasound. Patient sitting upright and will go back on 3 liters nasal cannula

## 2020-09-01 NOTE — Plan of Care (Signed)
  Problem: Education: Goal: Knowledge of General Education information will improve Description Including pain rating scale, medication(s)/side effects and non-pharmacologic comfort measures Outcome: Progressing   Problem: Health Behavior/Discharge Planning: Goal: Ability to manage health-related needs will improve Outcome: Progressing   

## 2020-09-01 NOTE — Progress Notes (Signed)
Patient scheduled for random renal biopsy in ultrasound with Greggory Keen, MD. Procedure aborted due to elevated blood pressure (see flowsheets). Hydralazine '10mg'$  IV given per verbal order of Shick, MD. Blood pressure continued to range between 0000000 systolic. Report called to [place], Therapist, sports. Patient transported back to inpatient unit via transport.

## 2020-09-02 ENCOUNTER — Inpatient Hospital Stay (HOSPITAL_COMMUNITY): Payer: Medicare HMO

## 2020-09-02 DIAGNOSIS — R601 Generalized edema: Secondary | ICD-10-CM | POA: Diagnosis not present

## 2020-09-02 LAB — GLUCOSE, CAPILLARY
Glucose-Capillary: 98 mg/dL (ref 70–99)
Glucose-Capillary: 99 mg/dL (ref 70–99)
Glucose-Capillary: 133 mg/dL — ABNORMAL HIGH (ref 70–99)
Glucose-Capillary: 135 mg/dL — ABNORMAL HIGH (ref 70–99)

## 2020-09-02 MED ORDER — SACCHAROMYCES BOULARDII 250 MG PO CAPS
250.0000 mg | ORAL_CAPSULE | Freq: Two times a day (BID) | ORAL | Status: DC
  Administered 2020-09-02 – 2020-09-03 (×2): 250 mg via ORAL
  Filled 2020-09-02 (×2): qty 1

## 2020-09-02 MED ORDER — HYDRALAZINE HCL 25 MG PO TABS
25.0000 mg | ORAL_TABLET | Freq: Three times a day (TID) | ORAL | Status: DC
  Administered 2020-09-02 – 2020-09-10 (×25): 25 mg via ORAL
  Filled 2020-09-02 (×25): qty 1

## 2020-09-02 MED ORDER — ADULT MULTIVITAMIN W/MINERALS CH
1.0000 | ORAL_TABLET | Freq: Every day | ORAL | Status: DC
  Administered 2020-09-03: 1 via ORAL
  Filled 2020-09-02 (×2): qty 1

## 2020-09-02 MED ORDER — VITAMIN B-12 1000 MCG PO TABS
1000.0000 ug | ORAL_TABLET | Freq: Every day | ORAL | Status: DC
  Administered 2020-09-03: 1000 ug via ORAL
  Filled 2020-09-02 (×2): qty 1

## 2020-09-02 MED ORDER — FOLIC ACID 1 MG PO TABS
1.0000 mg | ORAL_TABLET | Freq: Every day | ORAL | Status: DC
  Administered 2020-09-03: 1 mg via ORAL
  Filled 2020-09-02 (×2): qty 1

## 2020-09-02 MED ORDER — ALBUMIN HUMAN 25 % IV SOLN
12.5000 g | Freq: Once | INTRAVENOUS | Status: AC
  Administered 2020-09-02: 12.5 g via INTRAVENOUS
  Filled 2020-09-02: qty 50

## 2020-09-02 MED ORDER — ISOSORBIDE MONONITRATE ER 30 MG PO TB24
30.0000 mg | ORAL_TABLET | Freq: Every day | ORAL | Status: DC
  Administered 2020-09-02 – 2020-09-10 (×9): 30 mg via ORAL
  Filled 2020-09-02 (×9): qty 1

## 2020-09-02 NOTE — Progress Notes (Signed)
Physical Therapy Treatment Note  Clinical Impression: SATURATION QUALIFICATIONS: (This note is used to comply with regulatory documentation for home oxygen)  Patient Saturations on Room Air at Rest = 90%  Patient Saturations on Room Air while Ambulating = 86%  Patient Saturations on 3 Liters of oxygen while Ambulating = 92%  Please briefly explain why patient needs home oxygen: Patient requires supplemental oxygen to maintain oxygen saturations at acceptable, safe levels with physical activity.  (full PT treatment note to follow)  Roney [place], [place] Pager [phone] Office [phone]

## 2020-09-02 NOTE — Progress Notes (Signed)
Per IR Dr, we will hold on biopsy until pt is able to tolerate laying prone for procedure. Rad PA notified, [place] notified, floor RN notified

## 2020-09-02 NOTE — Care Management Important Message (Signed)
Important Message  Patient Details  Name: [name] [name] MRN: [id number] Date of Birth: 1960-11-23   Medicare Important Message Given:  Yes     Palin Tristan P [place] 09/02/2020, 2:53 PM

## 2020-09-02 NOTE — Progress Notes (Signed)
Occupational Therapy Treatment Patient Details Name: [name] [name] MRN: [id number] DOB: 03/12/1961 Today's Date: 09/02/2020    History of present illness 60 year old gentleman with history of hypertension, hyperlipidemia, type 2 diabetes who is recently followed up for nephrotic syndrome with gross proteinuria as outpatient admitted with worsening lower extremity swelling and anasarca with fluid overload and AKI.  Sent from nephrology office.   OT comments  Pt progressing well towards OT goals, able to demo mobility to/from sink using RW with Min A at most for turning and negotiating RW. Pt able to complete grooming tasks standing at sink > 5 minutes, requiring Min A for completion of bimanual tasks due to need for at least one UE support throughout task. During session, pt indicated scrotal pain due to swelling, so OT instructed pt in use of pillowcase to support scrotum during bed mobility and prevent excessive rubbing as pt continues to require Max A for all bed mobility. Assisted pt in elevating scrotum at end of session to further decrease swelling and improve comfort for OOB activities. Still hope for pt to be able to demonstrate continued progress to DC home. However, SNF must still also be considered. SpO2 >91% on 2 L O2.   Follow Up Recommendations  Home health OT;Supervision/Assistance - 24 hour (consider SNF if pt slow to progress)    Equipment Recommendations  3 in 1 bedside commode    Recommendations for Other Services      Precautions / Restrictions Precautions Precautions: Fall;Other (comment) Precaution Comments: monitor O2 Restrictions Weight Bearing Restrictions: No       Mobility Bed Mobility Overal bed mobility: Needs Assistance Bed Mobility: Supine to Sit;Sit to Supine     Supine to sit: Max assist;HOB elevated Sit to supine: Max assist   General bed mobility comments: Max A to sit EOB, reports back pain and difficulty sequencing noted though improving with  handheld assist for trunk support. Slow to initiate return back to bed, Max A for B LE back into bed and centering torso in bed. Instructed in use of pillowcase to support scrotum during movement to avoid further pain    Transfers Overall transfer level: Needs assistance Equipment used: Rolling walker (2 wheeled) Transfers: Sit to/from Stand Sit to Stand: Min assist;From elevated surface         General transfer comment: Min A for sit to stand from bedside with RW. cues for hand placement with pt able to follow but once attempting to stand, placed B hands back on RW and pulled despite education. Min A for turning RW efficiently    Balance Overall balance assessment: Needs assistance Sitting-balance support: Bilateral upper extremity supported;Feet supported Sitting balance-Leahy Scale: Fair     Standing balance support: Single extremity supported;Bilateral upper extremity supported;During functional activity Standing balance-Leahy Scale: Poor Standing balance comment: reliant on at least one UE support during ADLs at sink                           ADL either performed or assessed with clinical judgement   ADL Overall ADL's : Needs assistance/impaired     Grooming: Standing;Oral care;Wash/dry face;Minimal assistance Grooming Details (indicate cue type and reason): Min A overall for grooming tasks standing at sink. Pt with difficulty completing bimanual tasks (placing toothpaste on toothbrush, opening toothpaste) due to need for at least one UE support during task to maintain balance  General ADL Comments: Pt with improving endurance, mobility with ADLs. Continues with balance deficits. Pt reports scrotum is what is painful in crotch area due to swelling. Instructed pt to use pillow case to lift and support scrotum with bed mobility to avoid rubbing. Placed pillowcase under scrotum like hammock and propped up with washcloth to  further elevate at end of session     Vision   Vision Assessment?: No apparent visual deficits   Perception     Praxis      Cognition Arousal/Alertness: Awake/alert Behavior During Therapy: Flat affect Overall Cognitive Status: Impaired/Different from baseline Area of Impairment: Attention;Following commands;Safety/judgement;Awareness;Problem solving                   Current Attention Level: Selective   Following Commands: Follows one step commands with increased time Safety/Judgement: Decreased awareness of safety;Decreased awareness of deficits Awareness: Emergent Problem Solving: Slow processing;Difficulty sequencing;Requires verbal cues;Decreased initiation;Requires tactile cues General Comments: Decreased awareness of medical complexities and need for movement to progress home safely. Pt reports he does LE exercises in bed and didnt understand need to walk. OT encouraged pt that demonstrating mobility in hospital would further increase strength/safety as pt does not walk on his back at home. Pt perseverates on pain/fear of falling but responds well to encouragement and reassuration of therapist close by to assist        Exercises     Shoulder Instructions       General Comments SpO2 >91% on 2 L O2    Pertinent Vitals/ Pain       Pain Assessment: Faces Faces Pain Scale: Hurts even more Pain Location: scrotum due to swelling Pain Descriptors / Indicators: Grimacing;Sore Pain Intervention(s): Monitored during session;Repositioned;Other (comment) (elevated, used pillow case to assist with bed mob and prevent friction)  Home Living                                          Prior Functioning/Environment              Frequency  Min 2X/week        Progress Toward Goals  OT Goals(current goals can now be found in the care plan section)  Progress towards OT goals: Progressing toward goals  Acute Rehab OT Goals Patient Stated Goal:  decrease swelling, increase strength, figure out what is causing the swelling OT Goal Formulation: With patient/family Time For Goal Achievement: 09/14/20 Potential to Achieve Goals: Good ADL Goals Pt Will Perform Grooming: with supervision;standing Pt Will Perform Lower Body Dressing: with supervision;sitting/lateral leans;sit to/from stand Pt Will Transfer to Toilet: with supervision;ambulating Pt Will Perform Toileting - Clothing Manipulation and hygiene: with supervision;sit to/from stand;sitting/lateral leans Additional ADL Goal #1: Pt to verbalize at least 3 energy conservation strategies to implement during daily tasks Additional ADL Goal #2: Pt to demo bed mobility at Supervision level as precursor to ADLs  Plan Discharge plan remains appropriate    Co-evaluation                 AM-PAC OT "6 Clicks" Daily Activity     Outcome Measure   Help from another person eating meals?: Total (NPO) Help from another person taking care of personal grooming?: A Little Help from another person toileting, which includes using toliet, bedpan, or urinal?: A Lot Help from another person bathing (including washing, rinsing, drying)?: A Lot Help from another  person to put on and taking off regular upper body clothing?: A Little Help from another person to put on and taking off regular lower body clothing?: A Lot 6 Click Score: 13    End of Session Equipment Utilized During Treatment: Gait belt;Rolling walker;Oxygen  OT Visit Diagnosis: Unsteadiness on feet (R26.81);Other abnormalities of gait and mobility (R26.89);Muscle weakness (generalized) (M62.81);History of falling (Z91.81)   Activity Tolerance Patient tolerated treatment well   Patient Left in bed;with call bell/phone within reach;with bed alarm set   Nurse Communication Mobility status;Other (comment) (scrotal swelling and OT interventions)        Time: [id number] OT Time Calculation (min): 33 min  Charges: OT General  Charges $OT Visit: 1 Visit OT Treatments $Self Care/Home Management : 23-37 mins  Malachy Chamber, OTR/L Acute Rehab Services Office: [phone]   Layla Maw 09/02/2020, 9:42 AM

## 2020-09-02 NOTE — Progress Notes (Signed)
[facility] Progress Note  Patient: [name] [name]    [id number]  DOA: 08/28/2020     Date of Service: the patient was seen and examined on 09/02/2020  Brief hospital course: Past medical history of HTN, HLD, type II DM, nephrotic syndrome.  Presents with complaints of worsening renal function as well as anasarca. Currently plan is follow nephrology recommendation.  Assessment and Plan: 1.  Nephrotic syndrome with anasarca AKI nonoliguric UA with 10.9 g of proteinuria. Along with nephrotic syndrome he also has diabetic nephropathy. Nephrology consulted. Outpatient serology testing per nephrology was unremarkable including ANA, ANCA, C3, C4, hep B, hep C, HIV, RPR. SPEP negative for M spike. IR consulted for kidney biopsy although unable to do it given the patient's inability to lay prone. Currently receiving aggressive IV diuresis.  Albumin added.  Appreciate nephrology assistance. On lisinopril which is currently on hold per nephrology. Avoid nephrotoxins and avoid hypotension.  2.  Anemia of chronic kidney disease Iron deficiency Nephrology managing. Received IV iron. Monitor H&H.  Currently stable.  3.  Essential hypertension Uncontrolled. Currently on chlorthalidone, Norvasc as well as Lopressor. I will add Imdur and hydralazine.  Monitor.  4.  Hypokalemia Replete as needed.  5.  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 Hemoglobin A1c 5.3. Currently on sensitive sliding scale.  6.  Acute hypoxic respiratory failure Bilateral pleural effusion Atelectasis Acute on chronic combined systolic and diastolic CHF Currently needing 3 L of oxygen. Echocardiogram shows EF of 40 to 45% along with grade 2 diastolic dysfunction. Receiving aggressive IV diuresis. Incentive spirometry recommended to the patient as well. Repeat chest x-ray tomorrow to ensure  improvement. Would require cardiology consultation for further work-up of his systolic dysfunction  7.  Obesity Placing the patient at high risk for poor outcome in the setting of cardiac and renal dysfunction. Body mass index is 30.26 kg/m.   Diet: Renal diet DVT Prophylaxis:   heparin injection 5,000 Units Start: 09/02/20 0600    Advance goals of care discussion: Full code  Family Communication: no family was present at bedside, at the time of interview.   Disposition:  Status is: Inpatient  Remains inpatient appropriate because:Ongoing diagnostic testing needed not appropriate for outpatient work up   Dispo: The patient is from: Home              Anticipated d/c is to: SNF              Patient currently is not medically stable to d/c.   Difficult to place patient   Subjective: No nausea no vomiting.  No fever no chills.  Denies any shortness of breath but appears visibly short of breath.  No cough.  Physical Exam:  General: Appear in moderate distress, no Rash; Oral Mucosa Clear, moist. no Abnormal Neck Mass Or lumps, Conjunctiva normal  Cardiovascular: S1 and S2 Present, no Murmur, Respiratory: increased respiratory effort, Bilateral Air entry present and bilateral  Crackles, no wheezes Abdomen: Bowel Sound present, Soft and no tenderness Extremities: no Pedal edema Neurology: alert and oriented to time, place, and person affect appropriate. no new focal deficit Gait not checked due to patient safety concerns  Vitals:   09/02/20 1004 09/02/20 1200 09/02/20 1350 09/02/20 1646  BP: (!) 150/74  (!) 150/73 127/72  Pulse: (!) 56 65 (!) 54 (!) 56  Resp: '17  18 18  '$ Temp: 98.1 F (36.7 C)   98.2 F (36.8  C)  TempSrc: Oral     SpO2: 98%  100% 100%  Weight:        Intake/Output Summary (Last 24 hours) at 09/02/2020 1754 Last data filed at 09/02/2020 1745 Gross per 24 hour  Intake 150 ml  Output 1250 ml  Net -1100 ml   Filed Weights   09/01/20 0437 09/01/20 2106   Weight: 89.6 kg 89.6 kg    Data Reviewed: I have personally reviewed and interpreted daily labs, tele strips, imaging. I reviewed all nursing notes, pharmacy notes, vitals, pertinent old records I have discussed plan of care as described above with RN and patient/family.  CBC: Recent Labs  Lab 08/28/20 1030 08/28/20 1508 08/29/20 0250 09/01/20 0735  WBC 6.8 7.2 6.1 7.1  HGB 9.7* 8.7* 8.2* 9.0*  HCT 31.2* 27.8* 25.1* 26.7*  MCV 93.1 92.7 89.0 88.1  PLT 334 287 264 [id number]   Basic Metabolic Panel: Recent Labs  Lab 08/28/20 1030 08/28/20 1508 08/29/20 0250 08/30/20 0308 09/01/20 0735  NA 137  --  138 138 137  K 3.7  --  3.4* 4.6 4.0  CL 105  --  106 104 104  CO2 26  --  '27 24 25  '$ GLUCOSE 113*  --  93 105* 86  BUN 39*  --  38* 40* 40*  CREATININE 2.43* 2.36* 2.31* 2.29* 2.22*  CALCIUM 8.3*  --  8.3* 8.7* 8.7*  PHOS  --   --   --  4.8* 5.0*    Studies: DG Chest 2 View  Result Date: 09/02/2020 CLINICAL DATA:  Elevated blood pressure EXAM: CHEST - 2 VIEW COMPARISON:  08/28/2020 FINDINGS: Enlargement of cardiac silhouette with pulmonary vascular congestion. Mediastinal contours normal. BILATERAL pulmonary infiltrates likely representing pulmonary edema. BILATERAL pleural effusions and bibasilar atelectasis. No pneumothorax or acute osseous findings. IMPRESSION: Enlargement of cardiac silhouette with pulmonary vascular congestion and diffuse pulmonary edema, increased from prior exam. Associated bibasilar pleural effusions and atelectasis, increased from prior exam greater on RIGHT. Electronically Signed   By: Lavonia Dana M.D.   On: 09/02/2020 15:39    Scheduled Meds: . amLODipine  10 mg Oral Daily  . atorvastatin  10 mg Oral Daily  . chlorthalidone  25 mg Oral Daily  . furosemide  80 mg Intravenous Q8H  . gabapentin  100 mg Oral Daily  . heparin  5,000 Units Subcutaneous Q8H  . hydrALAZINE  25 mg Oral Q8H  . insulin aspart  0-9 Units Subcutaneous TID WC  . isosorbide  mononitrate  30 mg Oral Daily  . loratadine  10 mg Oral Daily  . metoprolol tartrate  50 mg Oral BID  . omega-3 acid ethyl esters  1 g Oral QHS  . sodium chloride flush  3 mL Intravenous Q12H   Continuous Infusions: . sodium chloride     PRN Meds: sodium chloride, acetaminophen **OR** acetaminophen, benzonatate, famotidine, polyethylene glycol, sodium chloride flush, traZODone  Time spent: 35 minutes  Author: Berle Mull, MD [facility] 09/02/2020 5:54 PM  To reach On-call, see care teams to locate the attending and reach out via [email or web address]. Between 7PM-7AM, please contact night-coverage If you still have difficulty reaching the attending provider, please page the [facility] (Director on Call) for [facility] on amion for assistance.

## 2020-09-02 NOTE — Progress Notes (Signed)
[facility] NEPHROLOGY PROGRESS NOTE  Assessment/ Plan: Pt is a 60 y.o. yo male  With HTN, HLD, DM, recently seen at [facility] for nephrotic syndrome presents with worsening lower extremity edema/anasarca, seen as a consultation for the management of fluid overload and AKI.  #Nephrotic syndrome with anasarca: UA with 10.9 g of proteinuria.  He likely has diabetic nephropathy but there is a concern for podocytopathy.  However with microscopic hematuria and AKI need to rule out underlying glomerular disease. Extensive serology test done as outpatient unremarkable, including ANA, ANCA, C3, C4, hepatitis B, C, HIV, RPR negative.  SPEP with no M spike and kappa lambda ratio was 0.95. -daily weights ordered, strict I/O -IR consulted for kidney biopsy, unable to be done due ot patient unable to lay prone -will add a dose of albumin with diuresis today, receiving a total of '240mg'$  of lasix daily, continue with '80mg'$  tid  #Acute kidney injury, nonoliguric: Probably hemodynamically mediated with reduced renal perfusion and ACE inhibitor.  Agree with holding lisinopril.  Diuretics to offload volume and to help renal perfusion.  Cr unchanged. I am suspecting that this is actually his new baseline function but time will tell Avoid nephrotoxins or hypotensive episode. -please check daily labs  #Anemia probably due to chronic disease and dilution: Iron saturation 12% therefore ordered IV iron.  Monitor hemoglobin.  #Hypertension, improved: Started chlorthalidone '25mg'$  daily, continue with norvasc '10mg'$  daily, lopressor '50mg'$  BID (titration limited by HR).  #Hypokalemia: replete prn. k has been stable  #Uncontrolled type 2 diabetes: per primary team.  Subjective: Seen and examined bedside. Biggest issue is him laying prone which he is reasonably afraid of. He does report that he feels like his swelling is better. No weight reported for today. No labs today.  Objective Vital signs in  last 24 hours: Vitals:   09/01/20 2106 09/02/20 0545 09/02/20 1004 09/02/20 1200  BP: (!) 158/80 140/86 (!) 150/74   Pulse: 67 64 (!) 56 65  Resp: '17 17 17   '$ Temp: 97.6 F (36.4 C) (!) 97.5 F (36.4 C) 98.1 F (36.7 C)   TempSrc: Oral Oral Oral   SpO2: 97% 97% 98%   Weight: 89.6 kg      Weight change: 0.004 kg  Intake/Output Summary (Last 24 hours) at 09/02/2020 1350 Last data filed at 09/02/2020 1200 Gross per 24 hour  Intake 150 ml  Output 1600 ml  Net -1450 ml       Labs: Basic Metabolic Panel: Recent Labs  Lab 08/29/20 0250 08/30/20 0308 09/01/20 0735  NA 138 138 137  K 3.4* 4.6 4.0  CL 106 104 104  CO2 '27 24 25  '$ GLUCOSE 93 105* 86  BUN 38* 40* 40*  CREATININE 2.31* 2.29* 2.22*  CALCIUM 8.3* 8.7* 8.7*  PHOS  --  4.8* 5.0*   Liver Function Tests: Recent Labs  Lab 08/28/20 1223 08/29/20 0250 08/30/20 0308 09/01/20 0735  AST 22 22  --   --   ALT 20 19  --   --   ALKPHOS 112 103  --   --   BILITOT 0.9 0.6  --   --   PROT 5.2* 5.3*  --   --   ALBUMIN 2.4* 2.8* 3.0* 2.8*   No results for input(s): LIPASE, AMYLASE in the last 168 hours. No results for input(s): AMMONIA in the last 168 hours. CBC: Recent Labs  Lab 08/28/20 1030 08/28/20 1508 08/29/20 0250 09/01/20 0735  WBC 6.8 7.2 6.1 7.1  HGB 9.7* 8.7* 8.2* 9.0*  HCT 31.2* 27.8* 25.1* 26.7*  MCV 93.1 92.7 89.0 88.1  PLT 334 287 264 298   Cardiac Enzymes: No results for input(s): CKTOTAL, CKMB, CKMBINDEX, TROPONINI in the last 168 hours. CBG: Recent Labs  Lab 09/01/20 1225 09/01/20 1640 09/01/20 2106 09/02/20 0638 09/02/20 1129  GLUCAP 92 162* 146* 98 99    Iron Studies:  No results for input(s): IRON, TIBC, TRANSFERRIN, FERRITIN in the last 72 hours. Studies/Results: No results found.  Medications: Infusions: . sodium chloride      Scheduled Medications: . amLODipine  10 mg Oral Daily  . atorvastatin  10 mg Oral Daily  . chlorthalidone  25 mg Oral Daily  . furosemide  80  mg Intravenous Q8H  . gabapentin  100 mg Oral Daily  . heparin  5,000 Units Subcutaneous Q8H  . hydrALAZINE  25 mg Oral Q8H  . insulin aspart  0-9 Units Subcutaneous TID WC  . isosorbide mononitrate  30 mg Oral Daily  . loratadine  10 mg Oral Daily  . metoprolol tartrate  50 mg Oral BID  . omega-3 acid ethyl esters  1 g Oral QHS  . sodium chloride flush  3 mL Intravenous Q12H    have reviewed scheduled and prn medications.  Physical Exam: General:NAD, lying on bed comfortable Heart:RRR, s1s2 nl Lungs: Bibasal reduced breath sound, clear anteriorly, on [place] Abdomen:soft, Non-tender, non-distended Extremities: Bilateral upper and lower extremities pitting edema present, anasarca (slightly improved) Neurology: Alert awake and following commands  [name] [name] 09/02/2020,1:50 PM  LOS: 5 days

## 2020-09-02 NOTE — Progress Notes (Signed)
Physical Therapy Treatment Patient Details Name: [name] [name] MRN: [id number] DOB: February 15, 1961 Today's Date: 09/02/2020    History of Present Illness 60 year old gentleman with history of hypertension, hyperlipidemia, type 2 diabetes who is recently followed up for nephrotic syndrome with gross proteinuria as outpatient admitted with worsening lower extremity swelling and anasarca with fluid overload and AKI.  Sent from nephrology office.    PT Comments    Coninued to work on functional mobility and activity tolerance. Session focused on ambulation for practice with DME (rolling walker). Pt ended in bed and pt was encouraged to mobilize pt prone in bed in preparation for kidney biopsy which resulted in min assist sidelying R with pillow between pts legs. Pt required moderate assistance for supine to sit and sit to stand. Pt progressed towards PT goals by walking 40 ft with rolling walker on 3L with O2 remaining above 95% (attempted 10 ft with O2 off and sats dropped to 87%). Pt required cues to keep within walker and stand with upright posture. Presents with improved tolerance for movement and pt continues to require encouragement and patience for activities. Pt will benefit from continued mobility and walking with assistance to prevent deconditioning and encourage safety.      Follow Up Recommendations  SNF;Other (comment) (Notable progress; may be able to dc home)     Equipment Recommendations  Rolling walker with 5" wheels;3in1 (PT) (consider rollator; may need supplememtal O2)    Recommendations for Other Services       Precautions / Restrictions Precautions Precautions: Fall;Other (comment) Precaution Comments: monitor O2 Restrictions Weight Bearing Restrictions: No    Mobility  Bed Mobility Overal bed mobility: Needs Assistance Bed Mobility: Supine to Sit     Supine to sit: Mod assist;+2 for safety/equipment Sit to supine:  (Pt encouraged to attempt prone; compromised with  sidelying to prep for renal biopsy.)   General bed mobility comments: Mod assist and use of bedrails; Stiff and lith difficulty weight shifting, making for inefficient and nergetically taxing movement    Transfers Overall transfer level: Needs assistance Equipment used: Rolling walker (2 wheeled) Transfers: Sit to/from Stand Sit to Stand: Mod assist         General transfer comment: Mod A for sit to stand from bedside with RW. cues for hand placement with pt able to follow but once attempting to stand, placed B hands back on RW and pulled despite education, with front wheels coming off the floor without assist to stabilizeMin A for turning RW efficiently  Ambulation/Gait Ambulation/Gait assistance: Min assist;+2 safety/equipment (chair push) Gait Distance (Feet): 55 Feet Assistive device: Rolling walker (2 wheeled) (and supplemental O2) Gait Pattern/deviations: Step-through pattern;Decreased stride length     General Gait Details: Lots of cues for RW proximity and upright posture; Performed an O2 qualifying walk (pt is not normally on supplemental O2 at home); see other PT note of this date   Stairs             Wheelchair Mobility    Modified Rankin (Stroke Patients Only)       Balance     Sitting balance-Leahy Scale: Fair       Standing balance-Leahy Scale: Poor                              Cognition Arousal/Alertness: Awake/alert Behavior During Therapy: Flat affect Overall Cognitive Status: Impaired/Different from baseline Area of Impairment: Attention;Following commands;Safety/judgement;Awareness;Problem solving  Following Commands: Follows one step commands with increased time Safety/Judgement: Decreased awareness of safety;Decreased awareness of deficits Awareness: Emergent Problem Solving: Slow processing;Difficulty sequencing;Requires verbal cues;Decreased initiation;Requires tactile cues General Comments:  Decreased awareness of medical complexities and need for movement to progress home safely. Pt reports he does LE exercises in bed and didnt understand need to walk. OT encouraged pt that demonstrating mobility in hospital would further increase strength/safety as pt does not walk on his back at home. Pt perseverates on pain/fear of falling but responds well to encouragement and reassuration of therapist close by to assist      Exercises      General Comments General comments (skin integrity, edema, etc.): Took extra time to work on getting to and tolerating prone in the bed, as he must get to prone for kidney biopsy; Able to get to approx 75% of the way to prone before he stopped, and opted to not go further; Suggested pillow prop at hips to help take pressure off of his abdomen in prone; O2 sats stayed at 94% or higher throughout tiral on supplemental O2      Pertinent Vitals/Pain Pain Assessment: Faces Faces Pain Scale: Hurts little more Pain Location: scrotum due to swelling Pain Descriptors / Indicators: Grimacing;Sore Pain Intervention(s): Monitored during session    Home Living                      Prior Function            PT Goals (current goals can now be found in the care plan section) Acute Rehab PT Goals Patient Stated Goal: decrease swelling, increase strength, figure out what is causing the swelling PT Goal Formulation: With patient Time For Goal Achievement: 09/14/20 Potential to Achieve Goals: Good Progress towards PT goals: Progressing toward goals    Frequency    Min 3X/week      PT Plan Current plan remains appropriate    Co-evaluation     PT goals addressed during session: Mobility/safety with mobility;Proper use of DME        AM-PAC PT "6 Clicks" Mobility   Outcome Measure  Help needed turning from your back to your side while in a flat bed without using bedrails?: A Little Help needed moving from lying on your back to sitting on the  side of a flat bed without using bedrails?: A Lot Help needed moving to and from a bed to a chair (including a wheelchair)?: A Lot Help needed standing up from a chair using your arms (e.g., wheelchair or bedside chair)?: A Lot Help needed to walk in hospital room?: A Little Help needed climbing 3-5 steps with a railing? : A Lot 6 Click Score: 14    End of Session Equipment Utilized During Treatment: Gait belt;Oxygen Activity Tolerance: Patient tolerated treatment well Patient left: with call bell/phone within reach;with family/visitor present;in bed Nurse Communication: Mobility status PT Visit Diagnosis: Unsteadiness on feet (R26.81);Other abnormalities of gait and mobility (R26.89);Muscle weakness (generalized) (M62.81)     Time: [id number] PT Time Calculation (min) (ACUTE ONLY): 21 min  Charges:  $Gait Training: 8-22 mins                     Roney Marion, [place]  Acute Rehabilitation Services Pager [phone] Office [place] 09/02/2020, 1:57 PM

## 2020-09-02 NOTE — Progress Notes (Signed)
IR consulted by Dr. Carolin Sicks for possible image-guided random renal biopsy.  IR RN spoke with patient's RN prior to calling down regarding ability to tolerate prone position for procedure. Per floor RN, patient cannot tolerate. Per Dr. Kathlene Cote, patient must be able to tolerate prone position for procedure to occur. Patient not called for procedure today, diet restarted. Will make NPO at midnight in hopes patient can tolerate prone position tomorrow for possible procedure tomorrow.  Please call IR with questions/concerns.   Bea Graff Moncerrat Burnstein, PA-C 09/02/2020, 1:00 PM

## 2020-09-03 DIAGNOSIS — R601 Generalized edema: Secondary | ICD-10-CM | POA: Diagnosis not present

## 2020-09-03 LAB — RENAL FUNCTION PANEL
Albumin: 2.6 g/dL — ABNORMAL LOW (ref 3.5–5.0)
Anion gap: 6 (ref 5–15)
BUN: 41 mg/dL — ABNORMAL HIGH (ref 8–23)
CO2: 27 mmol/L (ref 22–32)
Calcium: 8.4 mg/dL — ABNORMAL LOW (ref 8.9–10.3)
Chloride: 104 mmol/L (ref 98–111)
Creatinine, Ser: 2.59 mg/dL — ABNORMAL HIGH (ref 0.61–1.24)
GFR, Estimated: 27 mL/min — ABNORMAL LOW (ref >60)
Glucose, Bld: 133 mg/dL — ABNORMAL HIGH (ref 70–99)
Phosphorus: 4.9 mg/dL — ABNORMAL HIGH (ref 2.5–4.6)
Potassium: 4.1 mmol/L (ref 3.5–5.1)
Sodium: 137 mmol/L (ref 135–145)

## 2020-09-03 LAB — GLUCOSE, CAPILLARY
Glucose-Capillary: 120 mg/dL — ABNORMAL HIGH (ref 70–99)
Glucose-Capillary: 112 mg/dL — ABNORMAL HIGH (ref 70–99)
Glucose-Capillary: 89 mg/dL (ref 70–99)
Glucose-Capillary: 114 mg/dL — ABNORMAL HIGH (ref 70–99)

## 2020-09-03 LAB — CBC
HCT: 24.1 % — ABNORMAL LOW (ref 39.0–52.0)
Hemoglobin: 8 g/dL — ABNORMAL LOW (ref 13.0–17.0)
MCH: 29.7 pg (ref 26.0–34.0)
MCHC: 33.2 g/dL (ref 30.0–36.0)
MCV: 89.6 fL (ref 80.0–100.0)
Platelets: 231 10*3/uL (ref 150–400)
RBC: 2.69 MIL/uL — ABNORMAL LOW (ref 4.22–5.81)
RDW: 13.2 % (ref 11.5–15.5)
WBC: 6 10*3/uL (ref 4.0–10.5)
nRBC: 0 % (ref 0.0–0.2)

## 2020-09-03 LAB — MAGNESIUM: Magnesium: 2.3 mg/dL (ref 1.7–2.4)

## 2020-09-03 MED ORDER — HEPARIN SODIUM (PORCINE) 5000 UNIT/ML IJ SOLN
5000.0000 [IU] | Freq: Three times a day (TID) | INTRAMUSCULAR | Status: DC
  Administered 2020-09-04 – 2020-09-10 (×16): 5000 [IU] via SUBCUTANEOUS
  Filled 2020-09-03 (×17): qty 1

## 2020-09-03 MED ORDER — HYDROCOD POLST-CPM POLST ER 10-8 MG/5ML PO SUER
5.0000 mL | Freq: Two times a day (BID) | ORAL | Status: DC | PRN
  Administered 2020-09-08: 5 mL via ORAL
  Filled 2020-09-03: qty 5

## 2020-09-03 MED ORDER — FUROSEMIDE 10 MG/ML IJ SOLN
40.0000 mg | Freq: Once | INTRAMUSCULAR | Status: AC
  Administered 2020-09-03: 40 mg via INTRAVENOUS
  Filled 2020-09-03: qty 4

## 2020-09-03 MED ORDER — RENA-VITE PO TABS
1.0000 | ORAL_TABLET | Freq: Every day | ORAL | Status: DC
  Administered 2020-09-04 – 2020-09-09 (×5): 1 via ORAL
  Filled 2020-09-03 (×6): qty 1

## 2020-09-03 MED ORDER — DM-GUAIFENESIN ER 30-600 MG PO TB12
1.0000 | ORAL_TABLET | Freq: Two times a day (BID) | ORAL | Status: DC
  Administered 2020-09-03 – 2020-09-10 (×14): 1 via ORAL
  Filled 2020-09-03 (×15): qty 1

## 2020-09-03 NOTE — Plan of Care (Signed)
  Problem: Education: Goal: Knowledge of General Education information will improve Description Including pain rating scale, medication(s)/side effects and non-pharmacologic comfort measures Outcome: Progressing   

## 2020-09-03 NOTE — Progress Notes (Signed)
[facility] Progress Note  Patient: [name] [name]    [id number]  DOA: 08/28/2020     Date of Service: the patient was seen and examined on 09/03/2020  Brief hospital course: Past medical history of HTN, HLD, type II DM, nephrotic syndrome.  Presents with complaints of worsening renal function as well as anasarca. Currently plan is performed renal biopsy, monitor for improvement in volume status and stability of renal function.  Assessment and Plan: 1.  Nephrotic syndrome with anasarca AKI nonoliguric UA with 10.9 g of proteinuria. Along with nephrotic syndrome he also has diabetic nephropathy. Nephrology consulted. Outpatient serology testing per nephrology was unremarkable including ANA, ANCA, C3, C4, hep B, hep C, HIV, RPR. SPEP negative for M spike. IR consulted for kidney biopsy although unable to do it given the patient's inability to lay prone. Currently receiving aggressive IV diuresis.  Albumin added. Appreciate nephrology assistance. On lisinopril which is currently on hold per nephrology. Avoid nephrotoxins and avoid hypotension.  2.  Anemia of chronic kidney disease Iron deficiency Nephrology managing. Received IV iron. Monitor H&H.  Currently stable.  3.  Essential hypertension Uncontrolled. Currently on chlorthalidone, Norvasc as well as Lopressor. I will add Imdur and hydralazine.  Monitor.  4.  Hypokalemia Replete as needed.  5.  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 Hemoglobin A1c 5.3. Currently on sensitive sliding scale.  6.  Acute hypoxic respiratory failure Bilateral pleural effusion Atelectasis Acute on chronic combined systolic and diastolic CHF Currently needing 3 L of oxygen. Echocardiogram shows EF of 40 to 45% along with grade 2 diastolic dysfunction. Receiving aggressive IV diuresis. Incentive spirometry recommended to the  patient as well. Repeat chest x-ray tomorrow to ensure improvement. Would require cardiology consultation for further work-up of his systolic dysfunction  7.  Obesity Placing the patient at high risk for poor outcome in the setting of cardiac and renal dysfunction. Body mass index is 30.26 kg/m.   8.  Cough. Appears dry in nature. Patient reports multiple medications are causing him to choke. We will minimize medication. Recommended patient not to cough to clear his throat as it will only lead to more irritation and more coughing. Cough suppressant medication as ordered.  9.  Nosebleed. Very minor. Monitor for now. Recommended patient not to irritate his nasal mucosa.  Diet: Renal diet DVT Prophylaxis:   heparin injection 5,000 Units Start: 09/04/20 2200    Advance goals of care discussion: Full code  Family Communication: no family was present at bedside, at the time of interview.   Disposition:  Status is: Inpatient  Remains inpatient appropriate because:IV treatments appropriate due to intensity of illness or inability to take PO  Dispo: The patient is from: Home              Anticipated d/c is to: SNF              Patient currently is not medically stable to d/c.   Difficult to place patient No  Subjective: No nausea no vomiting or no fever no chills or no chest pain.  Abdominal pain.  Continues to have shortness of breath likely does have cough.  Also appears to have minor degree of nosebleed as he is using paper napkins to clear his nostrils.  Patient also has dry cough when he is trying to clear his throat.  Tells me that he is 'choking on those d**n pills'.  Physical Exam:  General: Appear in mild distress, no Rash; Oral Mucosa Clear, moist. no Abnormal Neck Mass Or lumps, Conjunctiva normal  Cardiovascular: S1 and S2 Present, no Murmur, Respiratory: increased respiratory effort, Bilateral Air entry present and bilateral  Crackles, no wheezes Abdomen: Bowel  Sound present, Soft and no tenderness Extremities: bilateral  Pedal edema Neurology: alert and oriented to time, place, and person affect appropriate. no new focal deficit Gait not checked due to patient safety concerns  Vitals:   09/02/20 2235 09/03/20 0516 09/03/20 0959 09/03/20 1642  BP: (!) 147/72 (!) 149/71 (!) 171/78 136/71  Pulse:  (!) 52 (!) 58 (!) 49  Resp:  '17 18 18  '$ Temp:  (!) 97.5 F (36.4 C) (!) 97.5 F (36.4 C) 98 F (36.7 C)  TempSrc:  Oral Oral   SpO2:  98% 98% 100%  Weight:        Intake/Output Summary (Last 24 hours) at 09/03/2020 1920 Last data filed at 09/03/2020 1700 Gross per 24 hour  Intake 150 ml  Output 875 ml  Net -725 ml   Filed Weights   09/01/20 0437 09/01/20 2106 09/02/20 2112  Weight: 89.6 kg 89.6 kg 89.6 kg    Data Reviewed: I have personally reviewed and interpreted daily labs, tele strips, imaging. I reviewed all nursing notes, pharmacy notes, vitals, pertinent old records I have discussed plan of care as described above with RN and patient/family.  CBC: Recent Labs  Lab 08/28/20 1030 08/28/20 1508 08/29/20 0250 09/01/20 0735 09/03/20 0448  WBC 6.8 7.2 6.1 7.1 6.0  HGB 9.7* 8.7* 8.2* 9.0* 8.0*  HCT 31.2* 27.8* 25.1* 26.7* 24.1*  MCV 93.1 92.7 89.0 88.1 89.6  PLT 334 287 264 298 [id number]   Basic Metabolic Panel: Recent Labs  Lab 08/28/20 1030 08/28/20 1508 08/29/20 0250 08/30/20 0308 09/01/20 0735 09/03/20 0448  NA 137  --  138 138 137 137  K 3.7  --  3.4* 4.6 4.0 4.1  CL 105  --  106 104 104 104  CO2 26  --  '27 24 25 27  '$ GLUCOSE 113*  --  93 105* 86 133*  BUN 39*  --  38* 40* 40* 41*  CREATININE 2.43* 2.36* 2.31* 2.29* 2.22* 2.59*  CALCIUM 8.3*  --  8.3* 8.7* 8.7* 8.4*  MG  --   --   --   --   --  2.3  PHOS  --   --   --  4.8* 5.0* 4.9*    Studies: No results found.  Scheduled Meds: . amLODipine  10 mg Oral Daily  . chlorthalidone  25 mg Oral Daily  . dextromethorphan-guaiFENesin  1 tablet Oral BID  . furosemide   40 mg Intravenous Once  . furosemide  80 mg Intravenous Q8H  . gabapentin  100 mg Oral Daily  . [START ON 09/04/2020] heparin  5,000 Units Subcutaneous Q8H  . hydrALAZINE  25 mg Oral Q8H  . insulin aspart  0-9 Units Subcutaneous TID WC  . isosorbide mononitrate  30 mg Oral Daily  . metoprolol tartrate  50 mg Oral BID  . [START ON 09/04/2020] multivitamin  1 tablet Oral QHS  . sodium chloride flush  3 mL Intravenous Q12H   Continuous Infusions: . sodium chloride     PRN Meds: sodium chloride, acetaminophen **OR** acetaminophen, chlorpheniramine-HYDROcodone, polyethylene glycol, sodium chloride flush, traZODone  Time spent: 35 minutes  Author: Berle Mull, MD [facility] 09/03/2020 7:20 PM  To reach On-call, see care teams to locate the  attending and reach out via [email or web address]. Between 7PM-7AM, please contact night-coverage If you still have difficulty reaching the attending provider, please page the [facility] (Director on Call) for [facility] on amion for assistance.

## 2020-09-03 NOTE — Progress Notes (Signed)
OT Cancellation Note  Patient Details Name: [name] [name] MRN: [id number] DOB: December 04, 1960   Cancelled Treatment:    Reason Eval/Treat Not Completed: Patient declined, no reason specified;Other (comment) pt declined OT session, reporting not feeling well and upset that he can't eat. will check back as time allows for OT session.  Harley Alto., COTA/L Acute Rehabilitation Services [phone]   Precious Haws 09/03/2020, 9:39 AM

## 2020-09-03 NOTE — Progress Notes (Signed)
[facility] NEPHROLOGY PROGRESS NOTE  Assessment/ Plan: Pt is a 60 y.o. yo male  With HTN, HLD, DM, recently seen at [facility] for nephrotic syndrome presents with worsening lower extremity edema/anasarca, seen as a consultation for the management of fluid overload and AKI.  #Nephrotic syndrome with anasarca: UA with 10.9 g of proteinuria.  He likely has diabetic nephropathy but there is a concern for podocytopathy.  However with microscopic hematuria and AKI need to rule out underlying glomerular disease. Extensive serology test done as outpatient unremarkable, including ANA, ANCA, C3, C4, hepatitis B, C, HIV, RPR negative.  SPEP with no M spike and kappa lambda ratio was 0.95. -daily weights ordered, strict I/O -IR consulted for kidney biopsy, unable to be done due ot patient unable to lay prone, reattempt today possibly -receiving total of '240mg'$  of lasix daily, chlorthalidone, fluid restrict. Extra lasix '40mg'$  tonight (total '120mg'$  with PM dose)  #Acute kidney injury, nonoliguric: Probably hemodynamically mediated with reduced renal perfusion and ACE inhibitor.  Agree with holding lisinopril.  Diuretics to offload volume and to help renal perfusion. Avoid nephrotoxins or hypotensive episode. -please check daily labs, rise in cr today likely related to diuresis, accepting rise in cr as we are trying to offload fluid  #Anemia probably due to chronic disease and dilution: Iron saturation 12% therefore ordered IV iron.  Monitor hemoglobin.  #Hypertension, improved: Started chlorthalidone '25mg'$  daily, continue with norvasc '10mg'$  daily, lopressor '50mg'$  BID (titration limited by HR).  #Hypokalemia: replete prn. k has been stable  #Uncontrolled type 2 diabetes: per primary team.  Subjective: Seen and examined bedside. He reports that his breathing is better. NPO today for re-attempt on biopsy. No weight this AM. Given albumin yesterday  Objective Vital signs in last 24  hours: Vitals:   09/02/20 2112 09/02/20 2235 09/03/20 0516 09/03/20 0959  BP: 96/68 (!) 147/72 (!) 149/71 (!) 171/78  Pulse: 63  (!) 52 (!) 58  Resp: '17  17 18  '$ Temp: (!) 97.4 F (36.3 C)  (!) 97.5 F (36.4 C) (!) 97.5 F (36.4 C)  TempSrc: Oral  Oral Oral  SpO2: 98%  98% 98%  Weight: 89.6 kg      Weight change: 0 kg  Intake/Output Summary (Last 24 hours) at 09/03/2020 1157 Last data filed at 09/03/2020 1010 Gross per 24 hour  Intake 336.17 ml  Output 875 ml  Net -538.83 ml       Labs: Basic Metabolic Panel: Recent Labs  Lab 08/30/20 0308 09/01/20 0735 09/03/20 0448  NA 138 137 137  K 4.6 4.0 4.1  CL 104 104 104  CO2 '24 25 27  '$ GLUCOSE 105* 86 133*  BUN 40* 40* 41*  CREATININE 2.29* 2.22* 2.59*  CALCIUM 8.7* 8.7* 8.4*  PHOS 4.8* 5.0* 4.9*   Liver Function Tests: Recent Labs  Lab 08/28/20 1223 08/29/20 0250 08/30/20 0308 09/01/20 0735 09/03/20 0448  AST 22 22  --   --   --   ALT 20 19  --   --   --   ALKPHOS 112 103  --   --   --   BILITOT 0.9 0.6  --   --   --   PROT 5.2* 5.3*  --   --   --   ALBUMIN 2.4* 2.8* 3.0* 2.8* 2.6*   No results for input(s): LIPASE, AMYLASE in the last 168 hours. No results for input(s): AMMONIA in the last 168 hours. CBC: Recent Labs  Lab 08/28/20 1030 08/28/20  1508 08/29/20 0250 09/01/20 0735 09/03/20 0448  WBC 6.8 7.2 6.1 7.1 6.0  HGB 9.7* 8.7* 8.2* 9.0* 8.0*  HCT 31.2* 27.8* 25.1* 26.7* 24.1*  MCV 93.1 92.7 89.0 88.1 89.6  PLT 334 287 264 298 231   Cardiac Enzymes: No results for input(s): CKTOTAL, CKMB, CKMBINDEX, TROPONINI in the last 168 hours. CBG: Recent Labs  Lab 09/02/20 1129 09/02/20 1652 09/02/20 2113 09/03/20 0628 09/03/20 1146  GLUCAP 99 133* 135* 120* 112*    Iron Studies:  No results for input(s): IRON, TIBC, TRANSFERRIN, FERRITIN in the last 72 hours. Studies/Results: DG Chest 2 View  Result Date: 09/02/2020 CLINICAL DATA:  Elevated blood pressure EXAM: CHEST - 2 VIEW COMPARISON:   08/28/2020 FINDINGS: Enlargement of cardiac silhouette with pulmonary vascular congestion. Mediastinal contours normal. BILATERAL pulmonary infiltrates likely representing pulmonary edema. BILATERAL pleural effusions and bibasilar atelectasis. No pneumothorax or acute osseous findings. IMPRESSION: Enlargement of cardiac silhouette with pulmonary vascular congestion and diffuse pulmonary edema, increased from prior exam. Associated bibasilar pleural effusions and atelectasis, increased from prior exam greater on RIGHT. Electronically Signed   By: Lavonia Dana M.D.   On: 09/02/2020 15:39    Medications: Infusions: . sodium chloride      Scheduled Medications: . amLODipine  10 mg Oral Daily  . chlorthalidone  25 mg Oral Daily  . dextromethorphan-guaiFENesin  1 tablet Oral BID  . furosemide  80 mg Intravenous Q8H  . gabapentin  100 mg Oral Daily  . heparin  5,000 Units Subcutaneous Q8H  . hydrALAZINE  25 mg Oral Q8H  . insulin aspart  0-9 Units Subcutaneous TID WC  . isosorbide mononitrate  30 mg Oral Daily  . metoprolol tartrate  50 mg Oral BID  . [START ON 09/04/2020] multivitamin  1 tablet Oral QHS  . sodium chloride flush  3 mL Intravenous Q12H    have reviewed scheduled and prn medications.  Physical Exam: General:NAD, lying on bed comfortable Heart:RRR, s1s2 nl Lungs: Bibasal reduced breath sound, on [place] Abdomen:soft, Non-tender, non-distended Extremities: Bilateral upper and lower extremities pitting edema present, anasarca (slightly improved) Neurology: Alert awake and following commands  Jeffrey Voth 09/03/2020,11:57 AM  LOS: 6 days

## 2020-09-04 ENCOUNTER — Inpatient Hospital Stay (HOSPITAL_COMMUNITY): Payer: Medicare HMO

## 2020-09-04 DIAGNOSIS — R601 Generalized edema: Secondary | ICD-10-CM | POA: Diagnosis not present

## 2020-09-04 LAB — RENAL FUNCTION PANEL
Albumin: 2.6 g/dL — ABNORMAL LOW (ref 3.5–5.0)
Anion gap: 8 (ref 5–15)
BUN: 42 mg/dL — ABNORMAL HIGH (ref 8–23)
CO2: 27 mmol/L (ref 22–32)
Calcium: 8.4 mg/dL — ABNORMAL LOW (ref 8.9–10.3)
Chloride: 101 mmol/L (ref 98–111)
Creatinine, Ser: 2.52 mg/dL — ABNORMAL HIGH (ref 0.61–1.24)
GFR, Estimated: 28 mL/min — ABNORMAL LOW (ref >60)
Glucose, Bld: 107 mg/dL — ABNORMAL HIGH (ref 70–99)
Phosphorus: 4.8 mg/dL — ABNORMAL HIGH (ref 2.5–4.6)
Potassium: 4.1 mmol/L (ref 3.5–5.1)
Sodium: 136 mmol/L (ref 135–145)

## 2020-09-04 LAB — CBC
HCT: 24.7 % — ABNORMAL LOW (ref 39.0–52.0)
Hemoglobin: 8 g/dL — ABNORMAL LOW (ref 13.0–17.0)
MCH: 29.4 pg (ref 26.0–34.0)
MCHC: 32.4 g/dL (ref 30.0–36.0)
MCV: 90.8 fL (ref 80.0–100.0)
Platelets: 241 10*3/uL (ref 150–400)
RBC: 2.72 MIL/uL — ABNORMAL LOW (ref 4.22–5.81)
RDW: 13.2 % (ref 11.5–15.5)
WBC: 5.8 10*3/uL (ref 4.0–10.5)
nRBC: 0 % (ref 0.0–0.2)

## 2020-09-04 LAB — GLUCOSE, CAPILLARY
Glucose-Capillary: 105 mg/dL — ABNORMAL HIGH (ref 70–99)
Glucose-Capillary: 95 mg/dL (ref 70–99)
Glucose-Capillary: 134 mg/dL — ABNORMAL HIGH (ref 70–99)
Glucose-Capillary: 129 mg/dL — ABNORMAL HIGH (ref 70–99)

## 2020-09-04 LAB — MAGNESIUM: Magnesium: 2.1 mg/dL (ref 1.7–2.4)

## 2020-09-04 MED ORDER — MIDAZOLAM HCL 2 MG/2ML IJ SOLN
INTRAMUSCULAR | Status: AC | PRN
Start: 2020-09-04 — End: 2020-09-04
  Administered 2020-09-04: 1 mg via INTRAVENOUS

## 2020-09-04 MED ORDER — MIDAZOLAM HCL 2 MG/2ML IJ SOLN
INTRAMUSCULAR | Status: AC
  Filled 2020-09-04: qty 2

## 2020-09-04 MED ORDER — SALINE SPRAY 0.65 % NA SOLN
1.0000 | NASAL | Status: DC | PRN
  Administered 2020-09-04: 1 via NASAL
  Filled 2020-09-04: qty 44

## 2020-09-04 MED ORDER — LIDOCAINE HCL (PF) 1 % IJ SOLN
INTRAMUSCULAR | Status: AC
  Filled 2020-09-04: qty 30

## 2020-09-04 MED ORDER — FENTANYL CITRATE (PF) 100 MCG/2ML IJ SOLN
INTRAMUSCULAR | Status: AC
  Filled 2020-09-04: qty 2

## 2020-09-04 MED ORDER — GELATIN ABSORBABLE 12-7 MM EX MISC
CUTANEOUS | Status: AC
  Filled 2020-09-04: qty 1

## 2020-09-04 MED ORDER — FENTANYL CITRATE (PF) 100 MCG/2ML IJ SOLN
INTRAMUSCULAR | Status: AC | PRN
  Administered 2020-09-04: 25 ug via INTRAVENOUS

## 2020-09-04 NOTE — Progress Notes (Signed)
[facility] NEPHROLOGY PROGRESS NOTE  Assessment/ Plan: Pt is a 60 y.o. yo male  With HTN, HLD, DM, recently seen at [facility] for nephrotic syndrome presents with worsening lower extremity edema/anasarca, seen as a consultation for the management of fluid overload and AKI.  #Nephrotic syndrome with anasarca: UA with 10.9 g of proteinuria.  He likely has diabetic nephropathy but there is a concern for podocytopathy.  However with microscopic hematuria and AKI need to rule out underlying glomerular disease. Extensive serology test done as outpatient unremarkable, including ANA, ANCA, C3, C4, hepatitis B, C, HIV, RPR negative.  SPEP with no M spike and kappa lambda ratio was 0.95. -daily weights ordered, strict I/O -s/p biopsy 3/25--awaiting results -receiving total of '240mg'$  of lasix daily, chlorthalidone, fluid restrict -weight is down to 85.9kg from 89.6kg  #Acute kidney injury, nonoliguric: Probably hemodynamically mediated with reduced renal perfusion and ACE inhibitor.  Agree with holding lisinopril.  Diuretics to offload volume and to help renal perfusion. Avoid nephrotoxins or hypotensive episode. -cr stable this am  #Anemia probably due to chronic disease and dilution: Iron saturation 12% therefore ordered IV iron.  Monitor hemoglobin.  #Hypertension, improved: chlorthalidone '25mg'$  daily, continue with norvasc '10mg'$  daily, lopressor '50mg'$  BID (titration limited by HR).  #Hypokalemia: replete prn. k has been stable  #Uncontrolled type 2 diabetes: per primary team.  Subjective: Seen and examined bedside. Just got back from biopsy, currently prone and doing well. Biggest complaint is that his nose is stuffy. When asked about his breathing and how he feels from swelling aspect, he says 'I don't know" especially after I tried to clarify that. No acute events, procedure without complication reported.  Objective Vital signs in last 24 hours: Vitals:   09/04/20 0945  09/04/20 0950 09/04/20 0955 09/04/20 1016  BP: (!) 149/84 138/75 (!) 157/69 (!) 154/74  Pulse: 67 68 66 71  Resp: '16 18 14 19  '$ Temp:    98.2 F (36.8 C)  TempSrc:      SpO2: 92% 94% 96% 96%  Weight:       Weight change: -3.704 kg  Intake/Output Summary (Last 24 hours) at 09/04/2020 1257 Last data filed at 09/04/2020 0700 Gross per 24 hour  Intake 400 ml  Output 1300 ml  Net -900 ml       Labs: Basic Metabolic Panel: Recent Labs  Lab 09/01/20 0735 09/03/20 0448 09/04/20 0548  NA 137 137 136  K 4.0 4.1 4.1  CL 104 104 101  CO2 '25 27 27  '$ GLUCOSE 86 133* 107*  BUN 40* 41* 42*  CREATININE 2.22* 2.59* 2.52*  CALCIUM 8.7* 8.4* 8.4*  PHOS 5.0* 4.9* 4.8*   Liver Function Tests: Recent Labs  Lab 08/29/20 0250 08/30/20 0308 09/01/20 0735 09/03/20 0448 09/04/20 0548  AST 22  --   --   --   --   ALT 19  --   --   --   --   ALKPHOS 103  --   --   --   --   BILITOT 0.6  --   --   --   --   PROT 5.3*  --   --   --   --   ALBUMIN 2.8*   < > 2.8* 2.6* 2.6*   < > = values in this interval not displayed.   No results for input(s): LIPASE, AMYLASE in the last 168 hours. No results for input(s): AMMONIA in the last 168 hours. CBC: Recent Labs  Lab 08/28/20 1508 08/29/20 0250 09/01/20 0735 09/03/20 0448 09/04/20 0548  WBC 7.2 6.1 7.1 6.0 5.8  HGB 8.7* 8.2* 9.0* 8.0* 8.0*  HCT 27.8* 25.1* 26.7* 24.1* 24.7*  MCV 92.7 89.0 88.1 89.6 90.8  PLT 287 264 298 231 241   Cardiac Enzymes: No results for input(s): CKTOTAL, CKMB, CKMBINDEX, TROPONINI in the last 168 hours. CBG: Recent Labs  Lab 09/03/20 1146 09/03/20 1641 09/03/20 2233 09/04/20 0650 09/04/20 1135  GLUCAP 112* 89 114* 105* 95    Iron Studies:  No results for input(s): IRON, TIBC, TRANSFERRIN, FERRITIN in the last 72 hours. Studies/Results: DG Chest 2 View  Result Date: 09/02/2020 CLINICAL DATA:  Elevated blood pressure EXAM: CHEST - 2 VIEW COMPARISON:  08/28/2020 FINDINGS: Enlargement of cardiac  silhouette with pulmonary vascular congestion. Mediastinal contours normal. BILATERAL pulmonary infiltrates likely representing pulmonary edema. BILATERAL pleural effusions and bibasilar atelectasis. No pneumothorax or acute osseous findings. IMPRESSION: Enlargement of cardiac silhouette with pulmonary vascular congestion and diffuse pulmonary edema, increased from prior exam. Associated bibasilar pleural effusions and atelectasis, increased from prior exam greater on RIGHT. Electronically Signed   By: Lavonia Dana M.D.   On: 09/02/2020 15:39   US BIOPSY (KIDNEY)  Result Date: 09/04/2020 INDICATION: Findings syndrome, acute kidney injury and fluid overload. Need for renal biopsy. EXAM: ULTRASOUND GUIDED CORE BIOPSY OF left kidney MEDICATIONS: None. ANESTHESIA/SEDATION: Fentanyl 25 mcg IV; Versed 1.0 mg IV Moderate Sedation Time:  21 minutes. The patient was continuously monitored during the procedure by the interventional radiology nurse under my direct supervision. PROCEDURE: The procedure, risks, benefits, and alternatives were explained to the patient. Questions regarding the procedure were encouraged and answered. The patient understands and consents to the procedure. A time-out was performed prior to initiating the procedure. Both kidneys were imaged by ultrasound in a prone position. The left flank region was prepped with chlorhexidine in a sterile fashion, and a sterile drape was applied covering the operative field. A sterile gown and sterile gloves were used for the procedure. Local anesthesia was provided with 1% Lidocaine. Under ultrasound guidance, a 15 gauge trocar needle was advanced to the posterior margin lower pole cortex of the left kidney. Two separate coaxial 16 gauge core biopsy samples were obtained and submitted in saline. A slurry of Gel-Foam pledgets mixed in sterile saline was then injected via the outer needle as the needle was removed and retracted. COMPLICATIONS: None immediate.  FINDINGS: Both kidneys are well visualized with ultrasound with the left kidney slightly better visualized at the time of biopsy. Solid core biopsy samples were obtained. IMPRESSION: Ultrasound-guided core biopsy performed at the level of lower pole cortex of the left kidney. Electronically Signed   By: Aletta Edouard M.D.   On: 09/04/2020 10:33    Medications: Infusions: . sodium chloride      Scheduled Medications: . amLODipine  10 mg Oral Daily  . chlorthalidone  25 mg Oral Daily  . dextromethorphan-guaiFENesin  1 tablet Oral BID  . furosemide  80 mg Intravenous Q8H  . gabapentin  100 mg Oral Daily  . gelatin adsorbable      . heparin  5,000 Units Subcutaneous Q8H  . hydrALAZINE  25 mg Oral Q8H  . insulin aspart  0-9 Units Subcutaneous TID WC  . isosorbide mononitrate  30 mg Oral Daily  . lidocaine (PF)      . metoprolol tartrate  50 mg Oral BID  . multivitamin  1 tablet Oral QHS  . sodium chloride flush  3 mL Intravenous Q12H    have reviewed scheduled and prn medications.  Physical Exam: General:NAD, lying prone in bed comfortable Heart:RRR, s1s2 nl Lungs: Bibasal reduced breath sound, on [place] Abdomen:soft, Non-tender, non-distended Extremities: Bilateral upper and lower extremities pitting edema present, anasarca (slightly improved) Neurology: Alert awake and following commands  Karesa Maultsby 09/04/2020,12:57 PM  LOS: 7 days

## 2020-09-04 NOTE — Progress Notes (Signed)
[facility] Progress Note  Patient: [name] [name]    [id number]  DOA: 08/28/2020     Date of Service: the patient was seen and examined on 09/04/2020  Brief hospital course: Past medical history of HTN, HLD, type II DM, nephrotic syndrome.  Presents with complaints of worsening renal function as well as anasarca. Currently plan is monitor for renal improvement  Assessment and Plan: 1.  Nephrotic syndrome with anasarca AKI nonoliguric UA with 10.9 g of proteinuria. Along with nephrotic syndrome he also has diabetic nephropathy. Nephrology consulted. Outpatient serology testing per nephrology was unremarkable including ANA, ANCA, C3, C4, hep B, hep C, HIV, RPR. SPEP negative for M spike. IR consulted for kidney biopsy underwent on 3/25. Currently receiving aggressive IV diuresis.  Albumin added. Appreciate nephrology assistance. On lisinopril which is currently on hold per nephrology. Avoid nephrotoxins and avoid hypotension.  2.  Anemia of chronic kidney disease Iron deficiency Nephrology managing. Received IV iron. Monitor H&H.  Currently stable.  3.  Essential hypertension Uncontrolled. Currently on chlorthalidone, Norvasc as well as Lopressor. Continue Imdur and hydralazine.  Monitor.  4.  Hypokalemia Replete as needed.  5.  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 Hemoglobin A1c 5.3. Currently on sensitive sliding scale.  6.  Acute hypoxic respiratory failure Bilateral pleural effusion Atelectasis Acute on chronic combined systolic and diastolic CHF Currently needing 3 L of oxygen. Echocardiogram shows EF of 40 to 45% along with grade 2 diastolic dysfunction. Receiving aggressive IV diuresis. Incentive spirometry recommended to the patient as well. Repeat chest x-ray tomorrow to ensure improvement. Would require cardiology consultation for further  work-up of his systolic dysfunction  7.  Obesity Placing the patient at high risk for poor outcome in the setting of cardiac and renal dysfunction. Body mass index is 29.01 kg/m.   8.  Cough. Appears dry in nature. Patient reports multiple medications are causing him to choke. We will minimize medication. Recommended patient not to cough to clear his throat as it will only lead to more irritation and more coughing. Cough suppressant medication as ordered.  Resolved.  9.  Nosebleed.  Resolved. Very minor. Monitor for now. Recommended patient not to irritate his nasal mucosa.  Diet: Renal diet DVT Prophylaxis:   heparin injection 5,000 Units Start: 09/04/20 2200    Advance goals of care discussion: Full code  Family Communication: no family was present at bedside, at the time of interview.   Disposition:  Status is: Inpatient  Remains inpatient appropriate because:IV treatments appropriate due to intensity of illness or inability to take PO  Dispo: The patient is from: Home              Anticipated d/c is to: SNF              Patient currently is not medically stable to d/c.   Difficult to place patient No  Subjective: No nausea no vomiting.  No fever no chills.  Cough improved.  No bleeding from the nose reported by patient.  Physical Exam:  General: Appear in mild distress, no Rash; Oral Mucosa Clear, moist. no Abnormal Neck Mass Or lumps, Conjunctiva normal  Cardiovascular: S1 and S2 Present, no Murmur, Respiratory: good respiratory effort, Bilateral Air entry present and bilateral crackles, no wheezes Abdomen: Bowel Sound present, Soft and no tenderness Extremities: Trace pedal edema Neurology: alert and oriented to time, place, and person affect appropriate. no new focal deficit  Gait not checked due to patient safety concerns    Vitals:   09/04/20 0950 09/04/20 0955 09/04/20 1016 09/04/20 1751  BP: 138/75 (!) 157/69 (!) 154/74 138/69  Pulse: 68 66 71 (!) 52   Resp: '18 14 19 17  '$ Temp:   98.2 F (36.8 C) 98 F (36.7 C)  TempSrc:      SpO2: 94% 96% 96% 99%  Weight:        Intake/Output Summary (Last 24 hours) at 09/04/2020 1913 Last data filed at 09/04/2020 1700 Gross per 24 hour  Intake 640 ml  Output 2100 ml  Net -1460 ml   Filed Weights   09/01/20 2106 09/02/20 2112 09/04/20 0547  Weight: 89.6 kg 89.6 kg 85.9 kg    Data Reviewed: I have personally reviewed and interpreted daily labs, tele strips, imaging. I reviewed all nursing notes, pharmacy notes, vitals, pertinent old records I have discussed plan of care as described above with RN and patient/family.  CBC: Recent Labs  Lab 08/29/20 0250 09/01/20 0735 09/03/20 0448 09/04/20 0548  WBC 6.1 7.1 6.0 5.8  HGB 8.2* 9.0* 8.0* 8.0*  HCT 25.1* 26.7* 24.1* 24.7*  MCV 89.0 88.1 89.6 90.8  PLT 264 298 231 [id number]   Basic Metabolic Panel: Recent Labs  Lab 08/29/20 0250 08/30/20 0308 09/01/20 0735 09/03/20 0448 09/04/20 0548  NA 138 138 137 137 136  K 3.4* 4.6 4.0 4.1 4.1  CL 106 104 104 104 101  CO2 '27 24 25 27 27  '$ GLUCOSE 93 105* 86 133* 107*  BUN 38* 40* 40* 41* 42*  CREATININE 2.31* 2.29* 2.22* 2.59* 2.52*  CALCIUM 8.3* 8.7* 8.7* 8.4* 8.4*  MG  --   --   --  2.3 2.1  PHOS  --  4.8* 5.0* 4.9* 4.8*    Studies: US BIOPSY (KIDNEY)  Result Date: 09/04/2020 INDICATION: Findings syndrome, acute kidney injury and fluid overload. Need for renal biopsy. EXAM: ULTRASOUND GUIDED CORE BIOPSY OF left kidney MEDICATIONS: None. ANESTHESIA/SEDATION: Fentanyl 25 mcg IV; Versed 1.0 mg IV Moderate Sedation Time:  21 minutes. The patient was continuously monitored during the procedure by the interventional radiology nurse under my direct supervision. PROCEDURE: The procedure, risks, benefits, and alternatives were explained to the patient. Questions regarding the procedure were encouraged and answered. The patient understands and consents to the procedure. A time-out was performed prior to  initiating the procedure. Both kidneys were imaged by ultrasound in a prone position. The left flank region was prepped with chlorhexidine in a sterile fashion, and a sterile drape was applied covering the operative field. A sterile gown and sterile gloves were used for the procedure. Local anesthesia was provided with 1% Lidocaine. Under ultrasound guidance, a 15 gauge trocar needle was advanced to the posterior margin lower pole cortex of the left kidney. Two separate coaxial 16 gauge core biopsy samples were obtained and submitted in saline. A slurry of Gel-Foam pledgets mixed in sterile saline was then injected via the outer needle as the needle was removed and retracted. COMPLICATIONS: None immediate. FINDINGS: Both kidneys are well visualized with ultrasound with the left kidney slightly better visualized at the time of biopsy. Solid core biopsy samples were obtained. IMPRESSION: Ultrasound-guided core biopsy performed at the level of lower pole cortex of the left kidney. Electronically Signed   By: Aletta Edouard M.D.   On: 09/04/2020 10:33    Scheduled Meds: . amLODipine  10 mg Oral Daily  . chlorthalidone  25 mg Oral Daily  .  dextromethorphan-guaiFENesin  1 tablet Oral BID  . furosemide  80 mg Intravenous Q8H  . gabapentin  100 mg Oral Daily  . gelatin adsorbable      . heparin  5,000 Units Subcutaneous Q8H  . hydrALAZINE  25 mg Oral Q8H  . insulin aspart  0-9 Units Subcutaneous TID WC  . isosorbide mononitrate  30 mg Oral Daily  . lidocaine (PF)      . metoprolol tartrate  50 mg Oral BID  . multivitamin  1 tablet Oral QHS  . sodium chloride flush  3 mL Intravenous Q12H   Continuous Infusions: . sodium chloride     PRN Meds: sodium chloride, acetaminophen **OR** acetaminophen, chlorpheniramine-HYDROcodone, polyethylene glycol, sodium chloride, sodium chloride flush, traZODone  Time spent: 35 minutes  Author: Berle Mull, MD [facility] 09/04/2020 7:13 PM  To reach  On-call, see care teams to locate the attending and reach out via [email or web address]. Between 7PM-7AM, please contact night-coverage If you still have difficulty reaching the attending provider, please page the [facility] (Director on Call) for [facility] on amion for assistance.

## 2020-09-04 NOTE — Procedures (Signed)
Interventional Radiology Procedure Note  Procedure: US Guided Biopsy of left kidney  Complications: None  Estimated Blood Loss: < 10 mL  Findings: 74 G core biopsy of left lower kidney performed under US guidance.  Two core samples obtained and sent to Pathology.  Venetia Night. Kathlene Cote, M.D Pager:  [phone]

## 2020-09-04 NOTE — Care Management Important Message (Signed)
Important Message  Patient Details  Name: [name] [name] MRN: [id number] Date of Birth: [birth year]   Medicare Important Message Given:  Yes - Important Message mailed due to current National Emergency   Verbal consent obtained due to current National Emergency  Relationship to patient: Self Contact Name: [place] Call Date: 09/04/20  Time: 1038 Phone: [phone] Outcome: No Answer/Busy Important Message mailed to: Patient address on file    Delorse Lek 09/04/2020, 10:38 AM

## 2020-09-04 NOTE — Progress Notes (Signed)
Physical Therapy Treatment Patient Details Name: [name] [name] MRN: [id number] DOB: 1961-01-04 Today's Date: 09/04/2020    History of Present Illness Pt is 60 year old gentleman admitted on 08/28/20.  He has been recently followed up for nephrotic syndrome with gross proteinuria as outpatient admitted with worsening lower extremity swelling and anasarca with fluid overload and AKI.  Sent from nephrology office. S/p L kidney biposy 3/25.  Pt with history of hypertension, hyperlipidemia, type 2 diabetes    PT Comments    Pt making gradual progress but has decreased motivation and required max encouragement/education to participate.  Pt with bil LE pitting edema and scrotal edema that limit mobility.  Tendency to posterior lean with sit to stand and increased forward momentum with ambulation requiring min A to maintain balance.  Continue to progress as able.  Continue to recommend SNF at this time as pt's mother unable to provide physical assist.    Follow Up Recommendations  SNF     Equipment Recommendations  Rolling walker with 5" wheels;3in1 (PT) (supplemental O2)    Recommendations for Other Services       Precautions / Restrictions Precautions Precautions: Fall Precaution Comments: monitor O2 Restrictions Weight Bearing Restrictions: No    Mobility  Bed Mobility Overal bed mobility: Needs Assistance Bed Mobility: Sit to Supine Rolling: Min assist Sidelying to sit: HOB elevated;Mod assist Supine to sit: Min assist     General bed mobility comments: Min A to bring legs back into bed    Transfers Overall transfer level: Needs assistance Equipment used: Rolling walker (2 wheeled) Transfers: Sit to/from Stand Sit to Stand: Min assist         General transfer comment: Min A to rise and steady; cues for hand placement  Ambulation/Gait Ambulation/Gait assistance: Min assist Gait Distance (Feet): 75 Feet Assistive device: Rolling walker (2 wheeled) Gait  Pattern/deviations: Step-to pattern;Decreased stride length;Shuffle;Wide base of support;Trunk flexed Gait velocity: decreased   General Gait Details: Frequent cues for posture and RW proximity.  Pt with wide BOS due to scrotal edema.  Shuffle step with decreased dorsiflexion likely due to LE edema.  Min A to steady - particularly with turns   Marine scientist [place] (Stroke Patients Only)       Balance Overall balance assessment: Needs assistance Sitting-balance support: Feet supported Sitting balance-Leahy Scale: Good     Standing balance support: Bilateral upper extremity supported Standing balance-Leahy Scale: Poor Standing balance comment: Pt requiring UE support and with tendency to posterior lean upon initial stand and forward momentum with gait                            Cognition Arousal/Alertness: Awake/alert Behavior During Therapy: Flat affect Overall Cognitive Status: Impaired/Different from baseline Area of Impairment: Safety/judgement                         Safety/Judgement: Decreased awareness of safety;Decreased awareness of deficits     General Comments: Pt with decreased motivation and needs max encouragement to mobilize OOB in order to progress towards home      Exercises General Exercises - Lower Extremity Ankle Circles/Pumps: AROM;Both;10 reps;Seated Long Arc Quad: AROM;Both;10 reps;Seated Hip Flexion/Marching: AROM;Both;10 reps;Seated    General Comments General comments (skin integrity, edema, etc.): At arrival pt stating "I already had my therapy today" (OT had seen pt  this am).  Pt requiring education on importance of mobility and encouragment to participate to progress toward home.  Pt lives alone with his mother next door (mother present).      Pertinent Vitals/Pain Pain Assessment: Faces Faces Pain Scale: Hurts little more Pain Location: swollen scrotum Pain Descriptors /  Indicators: Discomfort Pain Intervention(s): Limited activity within patient's tolerance;Monitored during session (recommended underwear with activity to assist w edema)    Home Living                      Prior Function            PT Goals (current goals can now be found in the care plan section) Acute Rehab PT Goals Patient Stated Goal: decrease swelling, increase strength, figure out what is causing the swelling PT Goal Formulation: With patient Time For Goal Achievement: 09/14/20 Potential to Achieve Goals: Good Progress towards PT goals: Progressing toward goals    Frequency    Min 3X/week      PT Plan Current plan remains appropriate    Co-evaluation              AM-PAC PT "6 Clicks" Mobility   Outcome Measure  Help needed turning from your back to your side while in a flat bed without using bedrails?: A Little Help needed moving from lying on your back to sitting on the side of a flat bed without using bedrails?: A Little Help needed moving to and from a bed to a chair (including a wheelchair)?: A Lot Help needed standing up from a chair using your arms (e.g., wheelchair or bedside chair)?: A Lot Help needed to walk in hospital room?: A Little Help needed climbing 3-5 steps with a railing? : A Lot 6 Click Score: 15    End of Session Equipment Utilized During Treatment: Gait belt;Oxygen Activity Tolerance: Patient tolerated treatment well Patient left: with call bell/phone within reach;with family/visitor present;in bed;with bed alarm set Nurse Communication: Mobility status PT Visit Diagnosis: Unsteadiness on feet (R26.81);Other abnormalities of gait and mobility (R26.89);Muscle weakness (generalized) (M62.81)     Time: [id number] PT Time Calculation (min) (ACUTE ONLY): 24 min  Charges:  $Gait Training: 8-22 mins $Therapeutic Exercise: 8-22 mins                     Abran Richard, PT Acute Rehab Services Pager [phone] [facility]  [place] 09/04/2020, 4:31 PM

## 2020-09-04 NOTE — Progress Notes (Signed)
Occupational Therapy Treatment Patient Details Name: [name] [name] MRN: [id number] DOB: 20-Mar-1961 Today's Date: 09/04/2020    History of present illness 60 year old gentleman with history of hypertension, hyperlipidemia, type 2 diabetes who is recently followed up for nephrotic syndrome with gross proteinuria as outpatient admitted with worsening lower extremity swelling and anasarca with fluid overload and AKI.  Sent from nephrology office. S/p L kidney biposy 3/25   OT comments  With encouragement and education on benefits of mobility patient agreeable to OOB activity. Patient needing mod A to manage LEs and trunk to sit up EOB as well as mod A to scoot "my Susanne Borders is raw." With cues for hand placement and bed height elevated patient min A to power up to standing. During sink side g/h patient had 2 posterior loss of balance needing min A and cues to hold sink/walker to steady. Pt min A to transfer to recliner with walker, encouraged to stay up OOB as tolerated. Patient with continued balance deficits posing fall risk, may need to consider SNF pending progress.    Follow Up Recommendations  Home health OT;Supervision/Assistance - 24 hour (consider SNF if slow to progress)    Equipment Recommendations  3 in 1 bedside commode       Precautions / Restrictions Precautions Precautions: Fall;Other (comment) Precaution Comments: monitor O2 Restrictions Weight Bearing Restrictions: No       Mobility Bed Mobility Overal bed mobility: Needs Assistance Bed Mobility: Rolling;Sidelying to Sit Rolling: Min assist Sidelying to sit: HOB elevated;Mod assist       General bed mobility comments: mod A to bring LEs to EOB and elevate trunk    Transfers Overall transfer level: Needs assistance Equipment used: Rolling walker (2 wheeled) Transfers: Sit to/from Stand Sit to Stand: Min assist;From elevated surface         General transfer comment: please see toilet transfer in ADL section     Balance Overall balance assessment: Needs assistance Sitting-balance support: Feet supported Sitting balance-Leahy Scale: Fair     Standing balance support: Single extremity supported;Bilateral upper extremity supported;During functional activity Standing balance-Leahy Scale: Poor Standing balance comment: reliant on at least one UE support during ADLs at sink                           ADL either performed or assessed with clinical judgement   ADL Overall ADL's : Needs assistance/impaired     Grooming: Oral care;Wash/dry face;Minimal assistance;Standing Grooming Details (indicate cue type and reason): min A due to poor standing balance, briefly able to lift UEs to apply tooth paste however note x2 posterior loss of balance needing cues to grab onto sink/ walker to stabilize                 Toilet Transfer: Minimal assistance;Cueing for sequencing;Ambulation;RW Toilet Transfer Details (indicate cue type and reason): bed height elevated, cues for safe hand placement and min A to power up to standing from EOB. min A when backing up to recliner for safety and cue to reach back         Functional mobility during ADLs: Minimal assistance;Cueing for safety;Cueing for sequencing;Rolling walker General ADL Comments: with encouragement patient agreeable to OOB ADLs and sitting up in recliner, patient with poor standing balance reliant on UEs and min A for safety due to posterior LOB               Cognition Arousal/Alertness: Awake/alert Behavior During Therapy: [facility] for tasks  assessed/performed Overall Cognitive Status: Impaired/Different from baseline Area of Impairment: Safety/judgement                         Safety/Judgement: Decreased awareness of safety;Decreased awareness of deficits     General Comments: need max encouragement to mobilize OOB in order to progress towards home                   Pertinent Vitals/ Pain       Pain Assessment:  Faces Faces Pain Scale: Hurts little more Pain Location: buttock when scooting to EOB Pain Descriptors / Indicators: Other (Comment) (raw) Pain Intervention(s): Monitored during session         Frequency  Min 2X/week        Progress Toward Goals  OT Goals(current goals can now be found in the care plan section)  Progress towards OT goals: Progressing toward goals  Acute Rehab OT Goals Patient Stated Goal: decrease swelling, increase strength, figure out what is causing the swelling OT Goal Formulation: With patient/family Time For Goal Achievement: 09/14/20 Potential to Achieve Goals: Good ADL Goals Pt Will Perform Grooming: with supervision;standing Pt Will Perform Lower Body Dressing: with supervision;sitting/lateral leans;sit to/from stand Pt Will Transfer to Toilet: with supervision;ambulating Pt Will Perform Toileting - Clothing Manipulation and hygiene: with supervision;sit to/from stand;sitting/lateral leans Additional ADL Goal #1: Pt to verbalize at least 3 energy conservation strategies to implement during daily tasks Additional ADL Goal #2: Pt to demo bed mobility at Supervision level as precursor to ADLs  Plan Discharge plan remains appropriate       AM-PAC OT "6 Clicks" Daily Activity     Outcome Measure   Help from another person eating meals?: None Help from another person taking care of personal grooming?: A Little Help from another person toileting, which includes using toliet, bedpan, or urinal?: A Lot Help from another person bathing (including washing, rinsing, drying)?: A Lot Help from another person to put on and taking off regular upper body clothing?: A Little Help from another person to put on and taking off regular lower body clothing?: A Lot 6 Click Score: 16    End of Session Equipment Utilized During Treatment: Rolling walker;Oxygen  OT Visit Diagnosis: Unsteadiness on feet (R26.81);Other abnormalities of gait and mobility (R26.89);Muscle  weakness (generalized) (M62.81);History of falling (Z91.81)   Activity Tolerance Patient tolerated treatment well   Patient Left in chair;with call bell/phone within reach;with chair alarm set;with family/visitor present   Nurse Communication Mobility status        Time: 1247-1310 OT Time Calculation (min): 23 min  Charges: OT General Charges $OT Visit: 1 Visit OT Treatments $Self Care/Home Management : 23-37 mins  Delbert Phenix OT OT pager: [phone]   Rosemary Holms 09/04/2020, 2:04 PM

## 2020-09-05 DIAGNOSIS — R601 Generalized edema: Secondary | ICD-10-CM | POA: Diagnosis not present

## 2020-09-05 LAB — RENAL FUNCTION PANEL
Albumin: 2.4 g/dL — ABNORMAL LOW (ref 3.5–5.0)
Anion gap: 5 (ref 5–15)
BUN: 42 mg/dL — ABNORMAL HIGH (ref 8–23)
CO2: 29 mmol/L (ref 22–32)
Calcium: 8.3 mg/dL — ABNORMAL LOW (ref 8.9–10.3)
Chloride: 100 mmol/L (ref 98–111)
Creatinine, Ser: 2.5 mg/dL — ABNORMAL HIGH (ref 0.61–1.24)
GFR, Estimated: 29 mL/min — ABNORMAL LOW (ref >60)
Glucose, Bld: 153 mg/dL — ABNORMAL HIGH (ref 70–99)
Phosphorus: 4.5 mg/dL (ref 2.5–4.6)
Potassium: 3.9 mmol/L (ref 3.5–5.1)
Sodium: 134 mmol/L — ABNORMAL LOW (ref 135–145)

## 2020-09-05 LAB — CBC
HCT: 25.4 % — ABNORMAL LOW (ref 39.0–52.0)
Hemoglobin: 8.4 g/dL — ABNORMAL LOW (ref 13.0–17.0)
MCH: 29.6 pg (ref 26.0–34.0)
MCHC: 33.1 g/dL (ref 30.0–36.0)
MCV: 89.4 fL (ref 80.0–100.0)
Platelets: 230 10*3/uL (ref 150–400)
RBC: 2.84 MIL/uL — ABNORMAL LOW (ref 4.22–5.81)
RDW: 13.2 % (ref 11.5–15.5)
WBC: 6.1 10*3/uL (ref 4.0–10.5)
nRBC: 0 % (ref 0.0–0.2)

## 2020-09-05 LAB — MAGNESIUM: Magnesium: 2.1 mg/dL (ref 1.7–2.4)

## 2020-09-05 LAB — GLUCOSE, CAPILLARY
Glucose-Capillary: 118 mg/dL — ABNORMAL HIGH (ref 70–99)
Glucose-Capillary: 120 mg/dL — ABNORMAL HIGH (ref 70–99)
Glucose-Capillary: 165 mg/dL — ABNORMAL HIGH (ref 70–99)

## 2020-09-05 MED ORDER — ALBUMIN HUMAN 25 % IV SOLN
12.5000 g | Freq: Once | INTRAVENOUS | Status: AC
  Administered 2020-09-05: 12.5 g via INTRAVENOUS
  Filled 2020-09-05: qty 50

## 2020-09-05 NOTE — Progress Notes (Signed)
[facility] Progress Note  Patient: [name] [name]    [id number]  DOA: 08/28/2020     Date of Service: the patient was seen and examined on 09/05/2020  Brief hospital course: Past medical history of HTN, HLD, type II DM, nephrotic syndrome.  Presents with complaints of worsening renal function as well as anasarca. Currently plan is monitor for renal improvement and results for the renal biopsy  Assessment and Plan: 1.  Nephrotic syndrome with anasarca AKI nonoliguric UA with 10.9 g of proteinuria. Along with nephrotic syndrome he also has diabetic nephropathy. Nephrology consulted. Outpatient serology testing per nephrology was unremarkable including ANA, ANCA, C3, C4, hep B, hep C, HIV, RPR. SPEP negative for M spike. IR consulted for kidney biopsy underwent on 3/25. Currently receiving aggressive IV diuresis.  Albumin added as needed. Appreciate nephrology assistance. On lisinopril which is currently on hold per nephrology. Avoid nephrotoxins and avoid hypotension.  2.  Anemia of chronic kidney disease Iron deficiency Nephrology managing. Received IV iron. Monitor H&H.  Currently stable.  3.  Essential hypertension Uncontrolled. Currently on chlorthalidone, Norvasc as well as Lopressor. Continue Imdur and hydralazine.  Monitor.  4.  Hypokalemia Replete as needed.  5.  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 Hemoglobin A1c 5.3. Currently on sensitive sliding scale.  6.  Acute hypoxic respiratory failure Bilateral pleural effusion Atelectasis Acute on chronic combined systolic and diastolic CHF Currently needing 3 L of oxygen. Echocardiogram shows EF of 40 to 45% along with grade 2 diastolic dysfunction. Receiving aggressive IV diuresis. Incentive spirometry recommended to the patient as well. Repeat chest x-ray tomorrow to ensure improvement. Would  require cardiology consultation for further work-up of his systolic dysfunction  7.  Obesity Placing the patient at high risk for poor outcome in the setting of cardiac and renal dysfunction. Body mass index is 29.01 kg/m.   8.  Cough.  Now resolved. Appears dry in nature. Patient reports multiple medications are causing him to choke. We will minimize medication. Recommended patient not to cough to clear his throat as it will only lead to more irritation and more coughing. Cough suppressant medication as ordered.  Resolved.  9.  Nosebleed.  Resolved. Very minor. Monitor for now. Recommended patient not to irritate his nasal mucosa.  Diet: Renal diet DVT Prophylaxis:   Place TED hose Start: 09/05/20 1035 heparin injection 5,000 Units Start: 09/04/20 2200    Advance goals of care discussion: Full code  Family Communication: no family was present at bedside, at the time of interview.   Disposition:  Status is: Inpatient  Remains inpatient appropriate because:IV treatments appropriate due to intensity of illness or inability to take PO  Dispo: The patient is from: Home              Anticipated d/c is to: SNF              Patient currently is not medically stable to d/c.   Difficult to place patient No  Subjective: No nausea no vomiting.  Continues to have swelling of the leg.  No fever no chills.  Not sure regarding using TED stockings.  Tells me that the swelling is actually improved significantly.  Physical Exam:  General: Appear in mild distress, no Rash; Oral Mucosa Clear, moist. no Abnormal Neck Mass Or lumps, Conjunctiva normal  Cardiovascular: S1 and S2 Present, no Murmur, Respiratory: increased respiratory effort, Bilateral Air entry present and trace  crackles, no wheezes Abdomen: Bowel Sound present, Soft and no tenderness Extremities: trace Pedal edema Neurology: alert and oriented to time, place, and person affect appropriate. no new focal deficit Gait not  checked due to patient safety concerns    Vitals:   09/05/20 0505 09/05/20 0800 09/05/20 1138 09/05/20 1624  BP: (!) 150/76 (!) 148/72 (!) 141/67 126/66  Pulse: (!) 55 60 60 (!) 54  Resp: '18 18 16 16  '$ Temp: 98.2 F (36.8 C)  98.2 F (36.8 C) 98.6 F (37 C)  TempSrc:   Oral Oral  SpO2: 99% 97% 99% 99%  Weight:        Intake/Output Summary (Last 24 hours) at 09/05/2020 1947 Last data filed at 09/05/2020 1600 Gross per 24 hour  Intake 290 ml  Output 800 ml  Net -510 ml   Filed Weights   09/01/20 2106 09/02/20 2112 09/04/20 0547  Weight: 89.6 kg 89.6 kg 85.9 kg    Data Reviewed: I have personally reviewed and interpreted daily labs, tele strips, imaging. I reviewed all nursing notes, pharmacy notes, vitals, pertinent old records I have discussed plan of care as described above with RN and patient/family.  CBC: Recent Labs  Lab 09/01/20 0735 09/03/20 0448 09/04/20 0548 09/05/20 0738  WBC 7.1 6.0 5.8 6.1  HGB 9.0* 8.0* 8.0* 8.4*  HCT 26.7* 24.1* 24.7* 25.4*  MCV 88.1 89.6 90.8 89.4  PLT 298 231 241 [id number]   Basic Metabolic Panel: Recent Labs  Lab 08/30/20 0308 09/01/20 0735 09/03/20 0448 09/04/20 0548 09/05/20 0305  NA 138 137 137 136 134*  K 4.6 4.0 4.1 4.1 3.9  CL 104 104 104 101 100  CO2 '24 25 27 27 29  '$ GLUCOSE 105* 86 133* 107* 153*  BUN 40* 40* 41* 42* 42*  CREATININE 2.29* 2.22* 2.59* 2.52* 2.50*  CALCIUM 8.7* 8.7* 8.4* 8.4* 8.3*  MG  --   --  2.3 2.1 2.1  PHOS 4.8* 5.0* 4.9* 4.8* 4.5    Studies: No results found.  Scheduled Meds: . amLODipine  10 mg Oral Daily  . chlorthalidone  25 mg Oral Daily  . dextromethorphan-guaiFENesin  1 tablet Oral BID  . furosemide  80 mg Intravenous Q8H  . gabapentin  100 mg Oral Daily  . heparin  5,000 Units Subcutaneous Q8H  . hydrALAZINE  25 mg Oral Q8H  . insulin aspart  0-9 Units Subcutaneous TID WC  . isosorbide mononitrate  30 mg Oral Daily  . metoprolol tartrate  50 mg Oral BID  . multivitamin  1 tablet  Oral QHS  . sodium chloride flush  3 mL Intravenous Q12H   Continuous Infusions: . sodium chloride     PRN Meds: sodium chloride, acetaminophen **OR** acetaminophen, chlorpheniramine-HYDROcodone, polyethylene glycol, sodium chloride, sodium chloride flush, traZODone  Time spent: 35 minutes  Author: Berle Mull, MD [facility] 09/05/2020 7:47 PM  To reach On-call, see care teams to locate the attending and reach out via [email or web address]. Between 7PM-7AM, please contact night-coverage If you still have difficulty reaching the attending provider, please page the [facility] (Director on Call) for [facility] on amion for assistance.

## 2020-09-05 NOTE — Progress Notes (Signed)
[name] [name] NEPHROLOGY PROGRESS NOTE  Assessment/ Plan: Pt is a 60 y.o. yo male  With HTN, HLD, DM, recently seen at [facility] for nephrotic syndrome presents with worsening lower extremity edema/anasarca, seen as a consultation for the management of fluid overload and AKI.  #Nephrotic syndrome with anasarca: UA with 10.9 g of proteinuria.  He likely has diabetic nephropathy but there is a concern for podocytopathy.  However with microscopic hematuria and AKI need to rule out underlying glomerular disease. Extensive serology test done as outpatient unremarkable, including ANA, ANCA, C3, C4, hepatitis B, C, HIV, RPR negative.  SPEP with no M spike and kappa lambda ratio was 0.95. -daily weights ordered, strict I/O -s/p biopsy 3/25--awaiting results. If truly just diabetic kidney disease with some IF/TA then would be okay with discharge provided he's doing better from a volume standpoint, most likely will get results by Monday -receiving total of '240mg'$  of lasix daily, chlorthalidone, fluid restrict -weight is down to 85.9kg from 89.6kg yesterday but no weight today. This needs to be daily (as ordered)  #Acute kidney injury, nonoliguric: Probably hemodynamically mediated with reduced renal perfusion and ACE inhibitor.  Agree with holding lisinopril.  Diuretics to offload volume and to help renal perfusion. Avoid nephrotoxins or hypotensive episode. -cr stable this am  #Anemia probably due to chronic disease and dilution: Iron saturation 12% therefore ordered IV iron.  Monitor hemoglobin.  #Hypertension, improved: chlorthalidone '25mg'$  daily, continue with norvasc '10mg'$  daily, lopressor '50mg'$  BID (titration limited by HR).  #Hypokalemia: replete prn. k has been stable  #Uncontrolled type 2 diabetes: per primary team.  Subjective: Seen and examined bedside. No acute events. When I asked him if he think's his swelling is better he responds by saying 'I don't know.' Not  offering any complaints at the moment. He's upset with his mom.  Objective Vital signs in last 24 hours: Vitals:   09/04/20 2059 09/05/20 0505 09/05/20 0800 09/05/20 1138  BP: (!) 142/77 (!) 150/76 (!) 148/72 (!) 141/67  Pulse: (!) 59 (!) 55 60 60  Resp: '18 18 18 16  '$ Temp: 98.2 F (36.8 C) 98.2 F (36.8 C)  98.2 F (36.8 C)  TempSrc: Oral   Oral  SpO2: 97% 99% 97% 99%  Weight:       Weight change:   Intake/Output Summary (Last 24 hours) at 09/05/2020 1418 Last data filed at 09/05/2020 1143 Gross per 24 hour  Intake 480 ml  Output 1400 ml  Net -920 ml       Labs: Basic Metabolic Panel: Recent Labs  Lab 09/03/20 0448 09/04/20 0548 09/05/20 0305  NA 137 136 134*  K 4.1 4.1 3.9  CL 104 101 100  CO2 '27 27 29  '$ GLUCOSE 133* 107* 153*  BUN 41* 42* 42*  CREATININE 2.59* 2.52* 2.50*  CALCIUM 8.4* 8.4* 8.3*  PHOS 4.9* 4.8* 4.5   Liver Function Tests: Recent Labs  Lab 09/03/20 0448 09/04/20 0548 09/05/20 0305  ALBUMIN 2.6* 2.6* 2.4*   No results for input(s): LIPASE, AMYLASE in the last 168 hours. No results for input(s): AMMONIA in the last 168 hours. CBC: Recent Labs  Lab 09/01/20 0735 09/03/20 0448 09/04/20 0548 09/05/20 0738  WBC 7.1 6.0 5.8 6.1  HGB 9.0* 8.0* 8.0* 8.4*  HCT 26.7* 24.1* 24.7* 25.4*  MCV 88.1 89.6 90.8 89.4  PLT 298 231 241 230   Cardiac Enzymes: No results for input(s): CKTOTAL, CKMB, CKMBINDEX, TROPONINI in the last 168 hours. CBG: Recent Labs  Lab  09/04/20 1135 09/04/20 1632 09/04/20 2100 09/05/20 0638 09/05/20 1136  GLUCAP 95 134* 129* 118* 120*    Iron Studies:  No results for input(s): IRON, TIBC, TRANSFERRIN, FERRITIN in the last 72 hours. Studies/Results: US BIOPSY (KIDNEY)  Result Date: 09/04/2020 INDICATION: Findings syndrome, acute kidney injury and fluid overload. Need for renal biopsy. EXAM: ULTRASOUND GUIDED CORE BIOPSY OF left kidney MEDICATIONS: None. ANESTHESIA/SEDATION: Fentanyl 25 mcg IV; Versed 1.0 mg IV  Moderate Sedation Time:  21 minutes. The patient was continuously monitored during the procedure by the interventional radiology nurse under my direct supervision. PROCEDURE: The procedure, risks, benefits, and alternatives were explained to the patient. Questions regarding the procedure were encouraged and answered. The patient understands and consents to the procedure. A time-out was performed prior to initiating the procedure. Both kidneys were imaged by ultrasound in a prone position. The left flank region was prepped with chlorhexidine in a sterile fashion, and a sterile drape was applied covering the operative field. A sterile gown and sterile gloves were used for the procedure. Local anesthesia was provided with 1% Lidocaine. Under ultrasound guidance, a 15 gauge trocar needle was advanced to the posterior margin lower pole cortex of the left kidney. Two separate coaxial 16 gauge core biopsy samples were obtained and submitted in saline. A slurry of Gel-Foam pledgets mixed in sterile saline was then injected via the outer needle as the needle was removed and retracted. COMPLICATIONS: None immediate. FINDINGS: Both kidneys are well visualized with ultrasound with the left kidney slightly better visualized at the time of biopsy. Solid core biopsy samples were obtained. IMPRESSION: Ultrasound-guided core biopsy performed at the level of lower pole cortex of the left kidney. Electronically Signed   By: Aletta Edouard M.D.   On: 09/04/2020 10:33    Medications: Infusions: . sodium chloride    . albumin human      Scheduled Medications: . amLODipine  10 mg Oral Daily  . chlorthalidone  25 mg Oral Daily  . dextromethorphan-guaiFENesin  1 tablet Oral BID  . furosemide  80 mg Intravenous Q8H  . gabapentin  100 mg Oral Daily  . heparin  5,000 Units Subcutaneous Q8H  . hydrALAZINE  25 mg Oral Q8H  . insulin aspart  0-9 Units Subcutaneous TID WC  . isosorbide mononitrate  30 mg Oral Daily  .  metoprolol tartrate  50 mg Oral BID  . multivitamin  1 tablet Oral QHS  . sodium chloride flush  3 mL Intravenous Q12H    have reviewed scheduled and prn medications.  Physical Exam: General:NAD, lying prone in bed comfortable Heart:RRR, s1s2 nl Lungs: Bibasal reduced breath sound, on [place] Abdomen:soft, Non-tender, non-distended Extremities: Bilateral upper and lower extremities pitting edema present, anasarca (slightly improved) Neurology: Alert awake and following commands  Jeneen Doutt 09/05/2020,2:18 PM  LOS: 8 days

## 2020-09-05 NOTE — Plan of Care (Signed)
  Problem: Health Behavior/Discharge Planning: Goal: Ability to manage health-related needs will improve Outcome: Progressing   Problem: Activity: Goal: Risk for activity intolerance will decrease Outcome: Progressing   Problem: Elimination: Goal: Will not experience complications related to bowel motility Outcome: Progressing   Problem: Safety: Goal: Ability to remain free from injury will improve Outcome: Progressing

## 2020-09-06 DIAGNOSIS — R601 Generalized edema: Secondary | ICD-10-CM | POA: Diagnosis not present

## 2020-09-06 LAB — GLUCOSE, CAPILLARY
Glucose-Capillary: 98 mg/dL (ref 70–99)
Glucose-Capillary: 116 mg/dL — ABNORMAL HIGH (ref 70–99)
Glucose-Capillary: 184 mg/dL — ABNORMAL HIGH (ref 70–99)
Glucose-Capillary: 123 mg/dL — ABNORMAL HIGH (ref 70–99)

## 2020-09-06 LAB — CBC
HCT: 22.6 % — ABNORMAL LOW (ref 39.0–52.0)
HCT: 26 % — ABNORMAL LOW (ref 39.0–52.0)
Hemoglobin: 7.4 g/dL — ABNORMAL LOW (ref 13.0–17.0)
Hemoglobin: 8.7 g/dL — ABNORMAL LOW (ref 13.0–17.0)
MCH: 29.2 pg (ref 26.0–34.0)
MCH: 29.8 pg (ref 26.0–34.0)
MCHC: 32.7 g/dL (ref 30.0–36.0)
MCHC: 33.5 g/dL (ref 30.0–36.0)
MCV: 89.3 fL (ref 80.0–100.0)
MCV: 89 fL (ref 80.0–100.0)
Platelets: 201 10*3/uL (ref 150–400)
Platelets: 247 K/uL (ref 150–400)
RBC: 2.53 MIL/uL — ABNORMAL LOW (ref 4.22–5.81)
RBC: 2.92 MIL/uL — ABNORMAL LOW (ref 4.22–5.81)
RDW: 13.2 % (ref 11.5–15.5)
RDW: 13.2 % (ref 11.5–15.5)
WBC: 6.3 10*3/uL (ref 4.0–10.5)
WBC: 7.3 K/uL (ref 4.0–10.5)
nRBC: 0 % (ref 0.0–0.2)
nRBC: 0 % (ref 0.0–0.2)

## 2020-09-06 LAB — RENAL FUNCTION PANEL
Albumin: 2.4 g/dL — ABNORMAL LOW (ref 3.5–5.0)
Anion gap: 7 (ref 5–15)
BUN: 41 mg/dL — ABNORMAL HIGH (ref 8–23)
CO2: 29 mmol/L (ref 22–32)
Calcium: 8.1 mg/dL — ABNORMAL LOW (ref 8.9–10.3)
Chloride: 98 mmol/L (ref 98–111)
Creatinine, Ser: 2.51 mg/dL — ABNORMAL HIGH (ref 0.61–1.24)
GFR, Estimated: 28 mL/min — ABNORMAL LOW (ref >60)
Glucose, Bld: 103 mg/dL — ABNORMAL HIGH (ref 70–99)
Phosphorus: 4.4 mg/dL (ref 2.5–4.6)
Potassium: 3.8 mmol/L (ref 3.5–5.1)
Sodium: 134 mmol/L — ABNORMAL LOW (ref 135–145)

## 2020-09-06 LAB — TYPE AND SCREEN
ABO/RH(D): AB POS
Antibody Screen: NEGATIVE

## 2020-09-06 LAB — MAGNESIUM: Magnesium: 2 mg/dL (ref 1.7–2.4)

## 2020-09-06 NOTE — Progress Notes (Signed)
[facility] Progress Note  Patient: [name] [name]    [id number]  DOA: 08/28/2020     Date of Service: the patient was seen and examined on 09/06/2020  Brief hospital course: Past medical history of HTN, HLD, type II DM, nephrotic syndrome.  Presents with complaints of worsening renal function as well as anasarca. Currently plan is monitor for renal improvement and results for the renal biopsy  Assessment and Plan: 1.  Nephrotic syndrome with anasarca AKI nonoliguric UA with 10.9 g of proteinuria. Along with nephrotic syndrome he also has diabetic nephropathy. Nephrology consulted. Outpatient serology testing per nephrology was unremarkable including ANA, ANCA, C3, C4, hep B, hep C, HIV, RPR. SPEP negative for M spike. IR consulted for kidney biopsy underwent on 3/25. Currently receiving aggressive IV diuresis.  Albumin added as needed. Appreciate nephrology assistance. On lisinopril which is currently on hold per nephrology. Avoid nephrotoxins and avoid hypotension. Adding Unna boots.  2.  Anemia of chronic kidney disease Iron deficiency Nephrology managing. Received IV iron. Monitor H&H.  Currently stable.  3.  Essential hypertension Uncontrolled. Currently on chlorthalidone, Norvasc as well as Lopressor. Continue Imdur and hydralazine.  Monitor.  4.  Hypokalemia Replete as needed.  5.  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 Hemoglobin A1c 5.3. Currently on sensitive sliding scale.  6.  Acute hypoxic respiratory failure Bilateral pleural effusion Atelectasis Acute on chronic combined systolic and diastolic CHF Currently needing 3 L of oxygen. Echocardiogram shows EF of 40 to 45% along with grade 2 diastolic dysfunction. Receiving aggressive IV diuresis.  Repeat chest x-ray ordered on 3/28. Incentive spirometry recommended to the patient as  well. Would require outpatient cardiology consultation for further work-up of his systolic dysfunction.  7.  Obesity Placing the patient at high risk for poor outcome in the setting of cardiac and renal dysfunction. Body mass index is 28.77 kg/m.   8.  Cough.  Now resolved. Appears dry in nature. Patient reports multiple medications are causing him to choke. We will minimize medication. Recommended patient not to cough to clear his throat as it will only lead to more irritation and more coughing. Cough suppressant medication as ordered.  Resolved.  9.  Nosebleed.  Resolved. Very minor. Monitor for now. Recommended patient not to irritate his nasal mucosa.  Diet: Renal diet DVT Prophylaxis:   Place TED hose Start: 09/05/20 1035 heparin injection 5,000 Units Start: 09/04/20 2200   Advance goals of care discussion: Full code  Family Communication: no family was present at bedside, at the time of interview.   Disposition:  Status is: Inpatient  Remains inpatient appropriate because:IV treatments appropriate due to intensity of illness or inability to take PO  Dispo: The patient is from: Home              Anticipated d/c is to: SNF              Patient currently is not medically stable to d/c.   Difficult to place patient No  Subjective: No nausea no vomiting.  Breathing okay.  Cough resolved.  Physical Exam:  General: Appear in mild distress, no Rash; Oral Mucosa Clear, moist. no Abnormal Neck Mass Or lumps, Conjunctiva normal  Cardiovascular: S1 and S2 Present, no Murmur, Respiratory: good respiratory effort, Bilateral Air entry present and faint basal crackles, no wheezes Abdomen: Bowel Sound present, Soft and no tenderness Extremities: Bilateral pedal edema Neurology: alert and oriented to  time, place, and person affect appropriate. no new focal deficit Gait not checked due to patient safety concerns    Vitals:   09/06/20 0453 09/06/20 0951 09/06/20 1011 09/06/20  1645  BP: (!) 142/70  (!) 153/88 123/68  Pulse: (!) 56 (!) 55 61 62  Resp: '18  16 16  '$ Temp: 98.2 F (36.8 C)  97.7 F (36.5 C) 98 F (36.7 C)  TempSrc: Oral  Oral Oral  SpO2: 98%  98% 99%  Weight: 85.2 kg       Intake/Output Summary (Last 24 hours) at 09/06/2020 1819 Last data filed at 09/06/2020 [id number] Gross per 24 hour  Intake 363 ml  Output 0 ml  Net 363 ml   Filed Weights   09/02/20 2112 09/04/20 0547 09/06/20 0453  Weight: 89.6 kg 85.9 kg 85.2 kg    Data Reviewed: I have personally reviewed and interpreted daily labs, tele strips, imaging. I reviewed all nursing notes, pharmacy notes, vitals, pertinent old records I have discussed plan of care as described above with RN and patient/family.  CBC: Recent Labs  Lab 09/03/20 0448 09/04/20 0548 09/05/20 0738 09/06/20 0443 09/06/20 1215  WBC 6.0 5.8 6.1 6.3 7.3  HGB 8.0* 8.0* 8.4* 7.4* 8.7*  HCT 24.1* 24.7* 25.4* 22.6* 26.0*  MCV 89.6 90.8 89.4 89.3 89.0  PLT 231 241 230 201 [id number]   Basic Metabolic Panel: Recent Labs  Lab 09/01/20 0735 09/03/20 0448 09/04/20 0548 09/05/20 0305 09/06/20 0443  NA 137 137 136 134* 134*  K 4.0 4.1 4.1 3.9 3.8  CL 104 104 101 100 98  CO2 '25 27 27 29 29  '$ GLUCOSE 86 133* 107* 153* 103*  BUN 40* 41* 42* 42* 41*  CREATININE 2.22* 2.59* 2.52* 2.50* 2.51*  CALCIUM 8.7* 8.4* 8.4* 8.3* 8.1*  MG  --  2.3 2.1 2.1 2.0  PHOS 5.0* 4.9* 4.8* 4.5 4.4    Studies: No results found.  Scheduled Meds:  amLODipine  10 mg Oral Daily   chlorthalidone  25 mg Oral Daily   dextromethorphan-guaiFENesin  1 tablet Oral BID   furosemide  80 mg Intravenous Q8H   gabapentin  100 mg Oral Daily   heparin  5,000 Units Subcutaneous Q8H   hydrALAZINE  25 mg Oral Q8H   insulin aspart  0-9 Units Subcutaneous TID WC   isosorbide mononitrate  30 mg Oral Daily   metoprolol tartrate  50 mg Oral BID   multivitamin  1 tablet Oral QHS   sodium chloride flush  3 mL Intravenous Q12H   Continuous  Infusions:  sodium chloride     PRN Meds: sodium chloride, acetaminophen **OR** acetaminophen, chlorpheniramine-HYDROcodone, polyethylene glycol, sodium chloride, sodium chloride flush, traZODone  Time spent: 35 minutes  Author: Berle Mull, MD [facility] 09/06/2020 6:19 PM  To reach On-call, see care teams to locate the attending and reach out via [email or web address]. Between 7PM-7AM, please contact night-coverage If you still have difficulty reaching the attending provider, please page the [facility] (Director on Call) for [facility] on amion for assistance.

## 2020-09-06 NOTE — Progress Notes (Signed)
[name] NEPHROLOGY PROGRESS NOTE  Assessment/ Plan: Pt is a 60 y.o. yo male  With HTN, HLD, DM, recently seen at [facility] for nephrotic syndrome presents with worsening lower extremity edema/anasarca, seen as a consultation for the management of fluid overload and AKI.  #Nephrotic syndrome with anasarca: UA with 10.9 g of proteinuria.  He likely has diabetic nephropathy but there is a concern for podocytopathy.  However with microscopic hematuria and AKI need to rule out underlying glomerular disease. Extensive serology test done as outpatient unremarkable, including ANA, ANCA, C3, C4, hepatitis B, C, HIV, RPR negative.  SPEP with no M spike and kappa lambda ratio was 0.95. -daily weights ordered, strict I/O -s/p biopsy 3/25--awaiting results. If truly just diabetic kidney disease with some IF/TA then would be okay with discharge provided he continues to do better from a volume standpoint, most likely will get results by Monday -receiving total of '240mg'$  of lasix daily, chlorthalidone, fluid restrict. Would continue this for now until his biopsy results at the very least -daily weights, has been coming down  #Acute kidney injury, nonoliguric: Probably hemodynamically mediated with reduced renal perfusion and ACE inhibitor.  Agree with holding lisinopril.  Diuretics to offload volume and to help renal perfusion. Avoid nephrotoxins or hypotensive episode. -cr stable this am  #Anemia probably due to chronic disease and dilution: Iron saturation 12% therefore ordered IV iron.  Monitor hemoglobin.  #Hypertension, improved: chlorthalidone '25mg'$  daily, continue with norvasc '10mg'$  daily, lopressor '50mg'$  BID (titration limited by HR).  #Hypokalemia: replete prn. k has been stable  #Uncontrolled type 2 diabetes: per primary team.  Subjective: Seen and examined chairside. No acute events. He reports that he feels as if his swelling is better but he eventually said that after  saying "I don't know" every time I ask him a question. He also reports that his breathing is a little better. No other complaints.  Objective Vital signs in last 24 hours: Vitals:   09/05/20 2011 09/06/20 0453 09/06/20 0951 09/06/20 1011  BP: 125/73 (!) 142/70  (!) 153/88  Pulse: (!) 55 (!) 56 (!) 55 61  Resp: '18 18  16  '$ Temp: 98.1 F (36.7 C) 98.2 F (36.8 C)  97.7 F (36.5 C)  TempSrc: Oral Oral  Oral  SpO2: 98% 98%  98%  Weight:  85.2 kg     Weight change:   Intake/Output Summary (Last 24 hours) at 09/06/2020 1314 Last data filed at 09/06/2020 0953 Gross per 24 hour  Intake 413 ml  Output 0 ml  Net 413 ml       Labs: Basic Metabolic Panel: Recent Labs  Lab 09/04/20 0548 09/05/20 0305 09/06/20 0443  NA 136 134* 134*  K 4.1 3.9 3.8  CL 101 100 98  CO2 '27 29 29  '$ GLUCOSE 107* 153* 103*  BUN 42* 42* 41*  CREATININE 2.52* 2.50* 2.51*  CALCIUM 8.4* 8.3* 8.1*  PHOS 4.8* 4.5 4.4   Liver Function Tests: Recent Labs  Lab 09/04/20 0548 09/05/20 0305 09/06/20 0443  ALBUMIN 2.6* 2.4* 2.4*   No results for input(s): LIPASE, AMYLASE in the last 168 hours. No results for input(s): AMMONIA in the last 168 hours. CBC: Recent Labs  Lab 09/03/20 0448 09/04/20 0548 09/05/20 0738 09/06/20 0443 09/06/20 1215  WBC 6.0 5.8 6.1 6.3 7.3  HGB 8.0* 8.0* 8.4* 7.4* 8.7*  HCT 24.1* 24.7* 25.4* 22.6* 26.0*  MCV 89.6 90.8 89.4 89.3 89.0  PLT 231 241 230 201 247  Cardiac Enzymes: No results for input(s): CKTOTAL, CKMB, CKMBINDEX, TROPONINI in the last 168 hours. CBG: Recent Labs  Lab 09/05/20 0638 09/05/20 1136 09/05/20 1625 09/06/20 0656 09/06/20 1148  GLUCAP 118* 120* 165* 98 116*    Iron Studies:  No results for input(s): IRON, TIBC, TRANSFERRIN, FERRITIN in the last 72 hours. Studies/Results: No results found.  Medications: Infusions: . sodium chloride      Scheduled Medications: . amLODipine  10 mg Oral Daily  . chlorthalidone  25 mg Oral Daily  .  dextromethorphan-guaiFENesin  1 tablet Oral BID  . furosemide  80 mg Intravenous Q8H  . gabapentin  100 mg Oral Daily  . heparin  5,000 Units Subcutaneous Q8H  . hydrALAZINE  25 mg Oral Q8H  . insulin aspart  0-9 Units Subcutaneous TID WC  . isosorbide mononitrate  30 mg Oral Daily  . metoprolol tartrate  50 mg Oral BID  . multivitamin  1 tablet Oral QHS  . sodium chloride flush  3 mL Intravenous Q12H    have reviewed scheduled and prn medications.  Physical Exam: General:NAD, sitting up in chair Heart:RRR, s1s2 nl Lungs: no iwob, unlabored, bl chest expansion Abdomen:soft, Non-tender, non-distended Extremities: Bilateral upper and lower extremities pitting edema present (1+ b/l LE's), anasarca (improved) Neurology: Alert awake and following commands  [name] [name] 09/06/2020,1:14 PM  LOS: 9 days

## 2020-09-06 NOTE — Progress Notes (Signed)
Occupational Therapy Treatment Patient Details Name: [name] [name] MRN: [id number] DOB: [birth year] Today's Date: 09/06/2020    History of present illness Pt is 60 year old gentleman admitted on 08/28/20.  He has been recently followed up for nephrotic syndrome with gross proteinuria as outpatient admitted with worsening lower extremity swelling and anasarca with fluid overload and AKI.  Sent from nephrology office. S/p L kidney biposy 3/25.  Pt with history of hypertension, hyperlipidemia, type 2 diabetes   OT comments  Pt. Seen for skilled OT treatment session.  Focus of treatment session was bed mobility, and simulated toileting tasks for continued efforts with increasing activity tolerance and safety with ADLS.  Pt. Initially reluctant for participation, requiring max encouragement for any activity.  Agreeable to oob to chair.  Continue to progress adls next session as pt. Able.   Follow Up Recommendations  Home health OT;Supervision/Assistance - 24 hour    Equipment Recommendations  3 in 1 bedside commode    Recommendations for Other Services      Precautions / Restrictions Precautions Precautions: Fall Precaution Comments: monitor O2       Mobility Bed Mobility Overal bed mobility: Needs Assistance Bed Mobility: Rolling;Sidelying to Sit Rolling: Mod assist Sidelying to sit: HOB elevated;Mod assist       General bed mobility comments: cues for hand placement on bed rails to aide with rolling and bringing b les off of bed, assistance to guide trunk once legs off of bed    Transfers Overall transfer level: Needs assistance Equipment used: Rolling walker (2 wheeled) Transfers: Sit to/from [facility] Sit to Stand: Mod assist Stand pivot transfers: Min assist       General transfer comment: heavy mod for sit/stand with multiple attempts, pt. yelling out about "my privates hurt" urged standing to ease pain/pressure, he cont. to yell and would not transition  into standing. multiple attempts to initiate sit/stand, once up able to initiate pivotal steps but required max cues for rw management. attempting to sit before transfer complete.    Balance                                           ADL either performed or assessed with clinical judgement   ADL Overall ADL's : Needs assistance/impaired                         Toilet Transfer: Biomedical scientist Details (indicate cue type and reason): simulated with in room transfer from eob approx 4 steps to recliner-declined ambualtion to b.room reporting he went earlier         Functional mobility during ADLs: Minimal assistance;Cueing for safety;Cueing for sequencing;Rolling walker General ADL Comments: max encouragement for participation-agreeable to oob to recliner, declined all other options.     Vision       Perception     Praxis      Cognition Arousal/Alertness: Awake/alert Behavior During Therapy: Flat affect                                            Exercises     Shoulder Instructions       General Comments      Pertinent Vitals/ Pain       Pain Assessment: Faces  Faces Pain Scale: Hurts whole lot Pain Location: swollen scrotum Pain Descriptors / Indicators: Discomfort Pain Intervention(s): Limited activity within patient's tolerance;Monitored during session;Repositioned  Home Living                                          Prior Functioning/Environment              Frequency  Min 2X/week        Progress Toward Goals  OT Goals(current goals can now be found in the care plan section)  Progress towards OT goals: Progressing toward goals     Plan Discharge plan remains appropriate    Co-evaluation                 AM-PAC OT "6 Clicks" Daily Activity     Outcome Measure   Help from another person eating meals?: None Help from another person taking care of personal  grooming?: A Little Help from another person toileting, which includes using toliet, bedpan, or urinal?: A Lot Help from another person bathing (including washing, rinsing, drying)?: A Lot Help from another person to put on and taking off regular upper body clothing?: A Little Help from another person to put on and taking off regular lower body clothing?: A Lot 6 Click Score: 16    End of Session Equipment Utilized During Treatment: Rolling walker;Oxygen  OT Visit Diagnosis: Unsteadiness on feet (R26.81);Other abnormalities of gait and mobility (R26.89);Muscle weakness (generalized) (M62.81);History of falling (Z91.81)   Activity Tolerance Patient limited by pain   Patient Left in chair;with call bell/phone within reach;with chair alarm set   Nurse Communication          Time: [phone] OT Time Calculation (min): 13 min  Charges: OT General Charges $OT Visit: 1 Visit OT Treatments $Self Care/Home Management : 8-22 mins  Sonia Baller, COTA/L Acute Rehabilitation [phone]  09/06/2020, 11:06 AM

## 2020-09-07 ENCOUNTER — Inpatient Hospital Stay (HOSPITAL_COMMUNITY): Payer: Medicare HMO

## 2020-09-07 DIAGNOSIS — R601 Generalized edema: Secondary | ICD-10-CM | POA: Diagnosis not present

## 2020-09-07 LAB — RENAL FUNCTION PANEL
Albumin: 2.2 g/dL — ABNORMAL LOW (ref 3.5–5.0)
Anion gap: 6 (ref 5–15)
BUN: 41 mg/dL — ABNORMAL HIGH (ref 8–23)
CO2: 29 mmol/L (ref 22–32)
Calcium: 8 mg/dL — ABNORMAL LOW (ref 8.9–10.3)
Chloride: 97 mmol/L — ABNORMAL LOW (ref 98–111)
Creatinine, Ser: 2.46 mg/dL — ABNORMAL HIGH (ref 0.61–1.24)
GFR, Estimated: 29 mL/min — ABNORMAL LOW (ref >60)
Glucose, Bld: 133 mg/dL — ABNORMAL HIGH (ref 70–99)
Phosphorus: 4.6 mg/dL (ref 2.5–4.6)
Potassium: 3.7 mmol/L (ref 3.5–5.1)
Sodium: 132 mmol/L — ABNORMAL LOW (ref 135–145)

## 2020-09-07 LAB — GLUCOSE, CAPILLARY
Glucose-Capillary: 128 mg/dL — ABNORMAL HIGH (ref 70–99)
Glucose-Capillary: 134 mg/dL — ABNORMAL HIGH (ref 70–99)
Glucose-Capillary: 175 mg/dL — ABNORMAL HIGH (ref 70–99)
Glucose-Capillary: 125 mg/dL — ABNORMAL HIGH (ref 70–99)

## 2020-09-07 LAB — ABO/RH: ABO/RH(D): AB POS

## 2020-09-07 LAB — CBC
HCT: 21.3 % — ABNORMAL LOW (ref 39.0–52.0)
Hemoglobin: 7.2 g/dL — ABNORMAL LOW (ref 13.0–17.0)
MCH: 29.8 pg (ref 26.0–34.0)
MCHC: 33.8 g/dL (ref 30.0–36.0)
MCV: 88 fL (ref 80.0–100.0)
Platelets: 195 10*3/uL (ref 150–400)
RBC: 2.42 MIL/uL — ABNORMAL LOW (ref 4.22–5.81)
RDW: 13 % (ref 11.5–15.5)
WBC: 5.4 10*3/uL (ref 4.0–10.5)
nRBC: 0 % (ref 0.0–0.2)

## 2020-09-07 LAB — MAGNESIUM: Magnesium: 2 mg/dL (ref 1.7–2.4)

## 2020-09-07 MED ORDER — TORSEMIDE 100 MG PO TABS
100.0000 mg | ORAL_TABLET | Freq: Every day | ORAL | Status: DC
  Administered 2020-09-07 – 2020-09-10 (×4): 100 mg via ORAL
  Filled 2020-09-07 (×4): qty 1

## 2020-09-07 NOTE — Plan of Care (Signed)
  Problem: Health Behavior/Discharge Planning: Goal: Ability to manage health-related needs will improve Outcome: Progressing   Problem: Clinical Measurements: Goal: Respiratory complications will improve Outcome: Progressing   Problem: Nutrition: Goal: Adequate nutrition will be maintained Outcome: Progressing   Problem: Coping: Goal: Level of anxiety will decrease Outcome: Progressing

## 2020-09-07 NOTE — Progress Notes (Signed)
Physical Therapy Treatment Note  Clinical Impression: SATURATION QUALIFICATIONS: (This note is used to comply with regulatory documentation for home oxygen)  Patient Saturations on Room Air at Rest = 92%  Patient Saturations on Room Air while Ambulating = 86%  Patient Saturations on 3 Liters of oxygen while Ambulating = 94%  Please briefly explain why patient needs home oxygen:  Patient requires supplemental oxygen to maintain oxygen saturations at acceptable, safe levels with physical activity.  [name] [name], [place]  Acute Rehabilitation Services Pager [phone] Office [phone]

## 2020-09-07 NOTE — Progress Notes (Signed)
Physical Therapy Treatment Patient Details Name: [name] [name] MRN: [id number] DOB: 09/07/1960 Today's Date: 09/07/2020    History of Present Illness Pt is 60 year old gentleman admitted on 08/28/20.  He has been recently followed up for nephrotic syndrome with gross proteinuria as outpatient admitted with worsening lower extremity swelling and anasarca with fluid overload and AKI.  Sent from nephrology office. S/p L kidney biposy 3/25.  Pt with history of hypertension, hyperlipidemia, type 2 diabetes    PT Comments    Continuing work on functional mobility and activity tolerance;  Session focused on transfers to standing and progressive ambulation in prep for possible dc tomorrow -- see other PT note of this date for O2 qualifying walk; Continues to require max encouragement to participate, and lots of cues for RW proximity and posture; My particular concern is his continued need for at least Mod assist to power up from sit to stand, and I'm unsure that pt's mother will be able to provide that level of assist safely at home, and recommend post-acute rehab to maximize independence and safety with mobility and ADLs prior to dc home;   At the end of session, we were joined by [name] [name] to discuss plans for after this hospital stay; Pt is adamant that he will be going home tomorrow, despite still needing a considerable amount of assist to stand up and walk  Follow Up Recommendations  SNF(If pt and family refuses SNF, recommend HHPT/OT follow up)     Equipment Recommendations  Rolling walker with 5" wheels;3in1 (PT) (supplemental O2)    Recommendations for Other Services       Precautions / Restrictions Precautions Precautions: Fall Precaution Comments: monitor O2 Restrictions Weight Bearing Restrictions: No    Mobility  Bed Mobility Overal bed mobility: Needs Assistance Bed Mobility: Supine to Sit     Supine to sit: Min assist     General bed mobility comments: Intiiated with  good bridge of hips to EOB; Min handheld assist to coem to sit; cues to scoot hips to EOB    Transfers Overall transfer level: Needs assistance Equipment used: Rolling walker (2 wheeled) Transfers: Sit to/from Stand Sit to Stand: Mod assist         General transfer [name]: Continues to need cues for hand placement and safety; Heavy mod assist to power up to stand, and cues to get to fully upright standing  Ambulation/Gait Ambulation/Gait assistance: Min assist;Mod assist;+2 safety/equipment (chair push) Gait Distance (Feet): 60 Feet Assistive device: Rolling walker (2 wheeled) Gait Pattern/deviations: Step-to pattern;Decreased stride length;Shuffle;Wide base of support;Trunk flexed     General Gait Details: Frequent cues for posture and RW proximity.  Pt with wide BOS due to scrotal edema.  Shuffle step with decreased dorsiflexion likely due to LE edema.  Min A to steady - particularly with turns; Mod assist and max encouragement towards end of walk   Stairs             Wheelchair Mobility    Modified Rankin (Stroke Patients Only)       Balance     Sitting balance-Leahy Scale: Good       Standing balance-Leahy Scale: Poor Standing balance [name]: Pt requiring UE support and with tendency to posterior lean upon initial stand and forward momentum with gait                            Cognition Arousal/Alertness: Awake/alert Behavior During Therapy: Flat affect Overall Cognitive  Status: Impaired/Different from baseline Area of Impairment: Safety/judgement                         Safety/Judgement: Decreased awareness of safety;Decreased awareness of deficits   Problem Solving: Slow processing;Difficulty sequencing;Requires verbal cues;Decreased initiation;Requires tactile cues General Comments: Pt with decreased motivation and needs max encouragement to mobilize OOB in order to progress towards home      Exercises      General  Comments General comments (skin integrity, edema, etc.): See other PT notw of this date      Pertinent Vitals/Pain Pain Assessment: Faces Faces Pain Scale: Hurts whole lot Pain Location: swollen scrotum Pain Descriptors / Indicators: Discomfort Pain Intervention(s): Monitored during session;Other ([name]) (used pillow case to hammock scrotum, be able to lift out of the way for more comfort of bed mobility/getting oOB)    Home Living                      Prior Function            PT Goals (current goals can now be found in the care plan section) Acute Rehab PT Goals Patient Stated Goal: decrease swelling, increase strength, figure out what is causing the swelling PT Goal Formulation: With patient Time For Goal Achievement: 09/14/20 Potential to Achieve Goals: Good Progress towards PT goals: Progressing toward goals (slowly)    Frequency    Min 3X/week      PT Plan Current plan remains appropriate    Co-evaluation              AM-PAC PT "6 Clicks" Mobility   Outcome Measure  Help needed turning from your back to your side while in a flat bed without using bedrails?: A Little Help needed moving from lying on your back to sitting on the side of a flat bed without using bedrails?: A Little Help needed moving to and from a bed to a chair (including a wheelchair)?: A Lot Help needed standing up from a chair using your arms (e.g., wheelchair or bedside chair)?: A Lot Help needed to walk in hospital room?: A Little Help needed climbing 3-5 steps with a railing? : A Lot 6 Click Score: 15    End of Session Equipment Utilized During Treatment: Gait belt;Oxygen Activity Tolerance: Patient tolerated treatment well Patient left: in chair;with call bell/phone within reach;with chair alarm set Nurse Communication: Mobility status PT Visit Diagnosis: Unsteadiness on feet (R26.81);Other abnormalities of gait and mobility (R26.89);Muscle weakness (generalized)  (M62.81)     Time: 1005-1040 PT Time Calculation (min) (ACUTE ONLY): 35 min  Charges:  $Gait Training: 23-37 mins                     [name] [name], [place]  Acute Rehabilitation Services Pager [phone] Office [phone]    [name] [name] 09/07/2020, 4:25 PM

## 2020-09-07 NOTE — Progress Notes (Signed)
[facility] Progress Note  Patient: [name] [name]    [id number]  DOA: 08/28/2020     Date of Service: the patient was seen and examined on 09/07/2020  Brief hospital course: Past medical history of HTN, HLD, type II DM, nephrotic syndrome.  Presents with complaints of worsening renal function as well as anasarca. Currently plan is monitor for renal improvement and results for the renal biopsy  Assessment and Plan: 1.  Nephrotic syndrome with anasarca AKI nonoliguric UA with 10.9 g of proteinuria. Along with nephrotic syndrome he also has diabetic nephropathy. Nephrology consulted. Outpatient serology testing per nephrology was unremarkable including ANA, ANCA, C3, C4, hep B, hep C, HIV, RPR. SPEP negative for M spike. IR consulted for kidney biopsy underwent on 3/25. Currently receiving aggressive IV diuresis.  Albumin added as needed. Appreciate nephrology assistance. On lisinopril which is currently on hold per nephrology. Avoid nephrotoxins and avoid hypotension. Adding Unna boots.  2.  Anemia of chronic kidney disease Iron deficiency Nephrology managing. Received IV iron. Monitor H&H.  Currently fluctuating but remaining stable.  Gradual decline likely from blood draws. Transfuse for hemoglobin less than 7.  3.  Essential hypertension Uncontrolled. Currently on chlorthalidone, Norvasc as well as Lopressor. Continue Imdur and hydralazine.  Monitor.  4.  Hypokalemia Replete as needed.  5.  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 Hemoglobin A1c 5.3. Currently on sensitive sliding scale.  6.  Acute hypoxic respiratory failure Bilateral pleural effusion Atelectasis Acute on chronic combined systolic and diastolic CHF Currently needing 3 L of oxygen. Echocardiogram shows EF of 40 to 45% along with grade 2 diastolic dysfunction. Receiving aggressive IV  diuresis.  Repeat chest x-ray ordered on 3/28 shows persistent pleural effusion.  Patient not interested in thoracentesis. Incentive spirometry recommended to the patient as well. Would require outpatient cardiology consultation for further work-up of his systolic dysfunction.  7.  Obesity Placing the patient at high risk for poor outcome in the setting of cardiac and renal dysfunction. Body mass index is 28.67 kg/m.   8.  Cough.  Now resolved. Appears dry in nature. Patient reports multiple medications are causing him to choke. We will minimize medication. Recommended patient not to cough to clear his throat as it will only lead to more irritation and more coughing. Cough suppressant medication as ordered.  Resolved.  9.  Nosebleed.  Resolved. Very minor. Monitor for now. Recommended patient not to irritate his nasal mucosa.  Diet: Renal diet DVT Prophylaxis:   Place TED hose Start: 09/05/20 1035 heparin injection 5,000 Units Start: 09/04/20 2200   Advance goals of care discussion: Full code  Family Communication: no family was present at bedside, at the time of interview.   Disposition:  Status is: Inpatient  Remains inpatient appropriate because:IV treatments appropriate due to intensity of illness or inability to take PO  Dispo: The patient is from: Home              Anticipated d/c is to: SNF              Patient currently is not medically stable to d/c.   Difficult to place patient No  Subjective: No acute complaints or no acute events overnight.  No bleeding reported by the patient.  Physical Exam:  General: Appear in mild distress, no Rash; Oral Mucosa Clear, moist. no Abnormal Neck Mass Or lumps, Conjunctiva normal  Cardiovascular: S1 and S2 Present, no Murmur,  Respiratory: good respiratory effort, Bilateral Air entry present and CTA, no Crackles, no wheezes Abdomen: Bowel Sound present, Soft and no tenderness Extremities: Bilateral pedal edema Neurology:  alert and oriented to time, place, and person affect appropriate. no new focal deficit Gait not checked due to patient safety concerns    Vitals:   09/07/20 0500 09/07/20 0539 09/07/20 0921 09/07/20 1652  BP:  132/66 (!) 144/78 127/62  Pulse:  67 60 (!) 51  Resp:  '18 17 17  '$ Temp:  98.4 F (36.9 C) 97.8 F (36.6 C) 98.4 F (36.9 C)  TempSrc:  Oral Oral Oral  SpO2:  98% 98% 100%  Weight: 84.9 kg       Intake/Output Summary (Last 24 hours) at 09/07/2020 1917 Last data filed at 09/07/2020 1700 Gross per 24 hour  Intake 1623 ml  Output 650 ml  Net 973 ml   Filed Weights   09/04/20 0547 09/06/20 0453 09/07/20 0500  Weight: 85.9 kg 85.2 kg 84.9 kg    Data Reviewed: I have personally reviewed and interpreted daily labs, tele strips, imaging. I reviewed all nursing notes, pharmacy notes, vitals, pertinent old records I have discussed plan of care as described above with RN and patient/family.  CBC: Recent Labs  Lab 09/04/20 0548 09/05/20 0738 09/06/20 0443 09/06/20 1215 09/07/20 0327  WBC 5.8 6.1 6.3 7.3 5.4  HGB 8.0* 8.4* 7.4* 8.7* 7.2*  HCT 24.7* 25.4* 22.6* 26.0* 21.3*  MCV 90.8 89.4 89.3 89.0 88.0  PLT 241 230 201 247 0000000   Basic Metabolic Panel: Recent Labs  Lab 09/03/20 0448 09/04/20 0548 09/05/20 0305 09/06/20 0443 09/07/20 0327  NA 137 136 134* 134* 132*  K 4.1 4.1 3.9 3.8 3.7  CL 104 101 100 98 97*  CO2 '27 27 29 29 29  '$ GLUCOSE 133* 107* 153* 103* 133*  BUN 41* 42* 42* 41* 41*  CREATININE 2.59* 2.52* 2.50* 2.51* 2.46*  CALCIUM 8.4* 8.4* 8.3* 8.1* 8.0*  MG 2.3 2.1 2.1 2.0 2.0  PHOS 4.9* 4.8* 4.5 4.4 4.6    Studies: DG CHEST PORT 1 VIEW  Result Date: 09/07/2020 CLINICAL DATA:  Pleural effusion EXAM: PORTABLE CHEST 1 VIEW COMPARISON:  09/02/2020 chest radiograph. FINDINGS: Stable cardiomediastinal silhouette with mild cardiomegaly. No pneumothorax. Small right and small to moderate left pleural effusions, similar. Mild pulmonary edema, similar.  Bibasilar atelectasis, similar. IMPRESSION: Stable mild congestive heart failure with small right and small to moderate left pleural effusions and bibasilar atelectasis. Electronically Signed   By: Ilona Sorrel M.D.   On: 09/07/2020 07:20    Scheduled Meds: . amLODipine  10 mg Oral Daily  . chlorthalidone  25 mg Oral Daily  . dextromethorphan-guaiFENesin  1 tablet Oral BID  . gabapentin  100 mg Oral Daily  . heparin  5,000 Units Subcutaneous Q8H  . hydrALAZINE  25 mg Oral Q8H  . insulin aspart  0-9 Units Subcutaneous TID WC  . isosorbide mononitrate  30 mg Oral Daily  . metoprolol tartrate  50 mg Oral BID  . multivitamin  1 tablet Oral QHS  . sodium chloride flush  3 mL Intravenous Q12H  . torsemide  100 mg Oral Daily   Continuous Infusions: . sodium chloride     PRN Meds: sodium chloride, acetaminophen **OR** acetaminophen, chlorpheniramine-HYDROcodone, polyethylene glycol, sodium chloride, sodium chloride flush, traZODone  Time spent: 35 minutes  Author: Berle Mull, MD [facility] 09/07/2020 7:17 PM  To reach On-call, see care teams to locate the attending and reach  out via [email or web address]. Between 7PM-7AM, please contact night-coverage If you still have difficulty reaching the attending provider, please page the [employer] (Director on Call) for [facility] on amion for assistance.

## 2020-09-07 NOTE — TOC Progression Note (Signed)
Transition of Care [facility]) - Progression Note    Patient Details  Name: [name] [name] MRN: [id number] Date of Birth: 13-Mar-1961  [facility] CM/SW Contact  Sharlet Salina Mila Homer, LCSW Phone Number: 09/07/2020, 11:04 AM  Clinical Narrative:  CSW visited with patient to discuss his discharge plan. Confirmed that he still plans to d/c home, although PT is recommending SNF. [name] indicated that his mom helps him at home and he has a walker and cane. Patient is now on 3 liters oxygen and physical therapist Earnest Bailey, who was in the room, indicated that he will need O2 at home.     Barriers to Discharge: Continued Medical Work up  Expected Discharge Plan and [place] with [facility] services      Discharge Planning Services: CM Consult Post Acute Care Choice: [place] arrangements for the past 2 months: [place]: OT,PT [place]: [name] Date [place]: 09/01/20 Time [place]: 1717 Representative spoke with at [place]: Alvis Lemmings   Social Determinants of Health (Mariano Colon) Interventions *None requested or needed at this time    Readmission Risk Interventions No flowsheet data found.

## 2020-09-07 NOTE — Care Management Important Message (Signed)
Important Message  Patient Details  Name: [name] [name] MRN: [id number] Date of Birth: Nov 20, 1960   Medicare Important Message Given:  Yes     [name] [name] 09/07/2020, 2:18 PM

## 2020-09-07 NOTE — Progress Notes (Signed)
[facility] NEPHROLOGY PROGRESS NOTE  Assessment/ Plan: Pt is a 59 y.o. yo male  With HTN, HLD, DM, recently seen at [facility] for nephrotic syndrome presents with worsening lower extremity edema/anasarca, seen as a consultation for the management of fluid overload and AKI.  #Nephrotic syndrome with anasarca: UA with 10.9 g of proteinuria.  He likely has diabetic nephropathy but there is a concern for podocytopathy.  However with microscopic hematuria and AKI need to rule out underlying glomerular disease. Extensive serology test done as outpatient unremarkable, including ANA, ANCA, C3, C4, hepatitis B, C, HIV, RPR negative.  SPEP with no M spike and kappa lambda ratio was 0.95. -daily weights ordered, strict I/O -s/p biopsy 3/25--awaiting results. If truly just diabetic kidney disease with some IF/TA then would be okay with discharge provided he continues to do better from a volume standpoint, most likely will get results today -Will change to torsemide 100 daily from IV lasix to make sure he continues to diurese on oral diuretic in prep for d/c -daily weights, has been coming down  #Acute kidney injury, nonoliguric: Probably hemodynamically mediated with reduced renal perfusion and ACE inhibitor.  Agree with holding lisinopril.  Diuretics to offload volume and to help renal perfusion. Avoid nephrotoxins or hypotensive episode. -cr stable this am  #Anemia probably due to chronic disease and dilution: Iron saturation 12% therefore ordered IV iron.  Monitor hemoglobin.  #Hypertension:  Acceptably controlled. Cont chlorthalidone '25mg'$  daily, continue with norvasc '10mg'$  daily, lopressor '50mg'$  BID (titration limited by HR).  #Hypokalemia: replete prn. k has been stable  #Uncontrolled type 2 diabetes: per primary team.  Subjective: Seen in room, mom bedside.  Overall feeling improved - edema improving but not to baseline. Declining SNF recommendation.  I/Os 1.1 / 1.45L, net  neg 8.1L per I/Os.   Objective Vital signs in last 24 hours: Vitals:   09/06/20 2127 09/07/20 0500 09/07/20 0539 09/07/20 0921  BP: 129/83  132/66 (!) 144/78  Pulse: 62  67 60  Resp: '17  18 17  '$ Temp: 99.1 F (37.3 C)  98.4 F (36.9 C) 97.8 F (36.6 C)  TempSrc: Oral  Oral Oral  SpO2: 100%  98% 98%  Weight:  84.9 kg     Weight change: -0.3 kg  Intake/Output Summary (Last 24 hours) at 09/07/2020 1155 Last data filed at 09/07/2020 0800 Gross per 24 hour  Intake 1263 ml  Output 1450 ml  Net -187 ml       Labs: Basic Metabolic Panel: Recent Labs  Lab 09/05/20 0305 09/06/20 0443 09/07/20 0327  NA 134* 134* 132*  K 3.9 3.8 3.7  CL 100 98 97*  CO2 '29 29 29  '$ GLUCOSE 153* 103* 133*  BUN 42* 41* 41*  CREATININE 2.50* 2.51* 2.46*  CALCIUM 8.3* 8.1* 8.0*  PHOS 4.5 4.4 4.6   Liver Function Tests: Recent Labs  Lab 09/05/20 0305 09/06/20 0443 09/07/20 0327  ALBUMIN 2.4* 2.4* 2.2*   No results for input(s): LIPASE, AMYLASE in the last 168 hours. No results for input(s): AMMONIA in the last 168 hours. CBC: Recent Labs  Lab 09/04/20 0548 09/05/20 0738 09/06/20 0443 09/06/20 1215 09/07/20 0327  WBC 5.8 6.1 6.3 7.3 5.4  HGB 8.0* 8.4* 7.4* 8.7* 7.2*  HCT 24.7* 25.4* 22.6* 26.0* 21.3*  MCV 90.8 89.4 89.3 89.0 88.0  PLT 241 230 201 247 195   Cardiac Enzymes: No results for input(s): CKTOTAL, CKMB, CKMBINDEX, TROPONINI in the last 168 hours. CBG: Recent Labs  Lab 09/06/20 0656 09/06/20 1148 09/06/20 1642 09/06/20 2118 09/07/20 0653  GLUCAP 98 116* 184* 123* 128*    Iron Studies:  No results for input(s): IRON, TIBC, TRANSFERRIN, FERRITIN in the last 72 hours. Studies/Results: DG CHEST PORT 1 VIEW  Result Date: 09/07/2020 CLINICAL DATA:  Pleural effusion EXAM: PORTABLE CHEST 1 VIEW COMPARISON:  09/02/2020 chest radiograph. FINDINGS: Stable cardiomediastinal silhouette with mild cardiomegaly. No pneumothorax. Small right and small to moderate left pleural  effusions, similar. Mild pulmonary edema, similar. Bibasilar atelectasis, similar. IMPRESSION: Stable mild congestive heart failure with small right and small to moderate left pleural effusions and bibasilar atelectasis. Electronically Signed   By: Ilona Sorrel M.D.   On: 09/07/2020 07:20    Medications: Infusions: . sodium chloride      Scheduled Medications: . amLODipine  10 mg Oral Daily  . chlorthalidone  25 mg Oral Daily  . dextromethorphan-guaiFENesin  1 tablet Oral BID  . furosemide  80 mg Intravenous Q8H  . gabapentin  100 mg Oral Daily  . heparin  5,000 Units Subcutaneous Q8H  . hydrALAZINE  25 mg Oral Q8H  . insulin aspart  0-9 Units Subcutaneous TID WC  . isosorbide mononitrate  30 mg Oral Daily  . metoprolol tartrate  50 mg Oral BID  . multivitamin  1 tablet Oral QHS  . sodium chloride flush  3 mL Intravenous Q12H    have reviewed scheduled and prn medications.  Physical Exam: General:NAD, sitting up in chair Heart:RRR, s1s2 nl Lungs: no iwob, unlabored, bl chest expansion Abdomen:soft, Non-tender, non-distended Extremities: 2+ pitting midway to knees Neurology: Alert awake and following commands GU; condom cath with urine in bad  Justin Mend 09/07/2020,11:55 AM  LOS: 10 days

## 2020-09-08 DIAGNOSIS — R601 Generalized edema: Secondary | ICD-10-CM | POA: Diagnosis not present

## 2020-09-08 DIAGNOSIS — N049 Nephrotic syndrome with unspecified morphologic changes: Secondary | ICD-10-CM | POA: Diagnosis not present

## 2020-09-08 DIAGNOSIS — G629 Polyneuropathy, unspecified: Secondary | ICD-10-CM

## 2020-09-08 DIAGNOSIS — I5043 Acute on chronic combined systolic (congestive) and diastolic (congestive) heart failure: Secondary | ICD-10-CM | POA: Diagnosis not present

## 2020-09-08 LAB — RENAL FUNCTION PANEL
Albumin: 2.2 g/dL — ABNORMAL LOW (ref 3.5–5.0)
Anion gap: 5 (ref 5–15)
BUN: 44 mg/dL — ABNORMAL HIGH (ref 8–23)
CO2: 30 mmol/L (ref 22–32)
Calcium: 8.1 mg/dL — ABNORMAL LOW (ref 8.9–10.3)
Chloride: 96 mmol/L — ABNORMAL LOW (ref 98–111)
Creatinine, Ser: 2.59 mg/dL — ABNORMAL HIGH (ref 0.61–1.24)
GFR, Estimated: 27 mL/min — ABNORMAL LOW (ref >60)
Glucose, Bld: 104 mg/dL — ABNORMAL HIGH (ref 70–99)
Phosphorus: 4.9 mg/dL — ABNORMAL HIGH (ref 2.5–4.6)
Potassium: 3.8 mmol/L (ref 3.5–5.1)
Sodium: 131 mmol/L — ABNORMAL LOW (ref 135–145)

## 2020-09-08 LAB — CBC
HCT: 22.3 % — ABNORMAL LOW (ref 39.0–52.0)
Hemoglobin: 7.5 g/dL — ABNORMAL LOW (ref 13.0–17.0)
MCH: 29.5 pg (ref 26.0–34.0)
MCHC: 33.6 g/dL (ref 30.0–36.0)
MCV: 87.8 fL (ref 80.0–100.0)
Platelets: 204 10*3/uL (ref 150–400)
RBC: 2.54 MIL/uL — ABNORMAL LOW (ref 4.22–5.81)
RDW: 13.1 % (ref 11.5–15.5)
WBC: 5 10*3/uL (ref 4.0–10.5)
nRBC: 0 % (ref 0.0–0.2)

## 2020-09-08 LAB — GLUCOSE, CAPILLARY
Glucose-Capillary: 83 mg/dL (ref 70–99)
Glucose-Capillary: 103 mg/dL — ABNORMAL HIGH (ref 70–99)
Glucose-Capillary: 123 mg/dL — ABNORMAL HIGH (ref 70–99)

## 2020-09-08 LAB — MAGNESIUM: Magnesium: 2 mg/dL (ref 1.7–2.4)

## 2020-09-08 MED ORDER — SODIUM CHLORIDE 0.9 % IV SOLN
250.0000 mg | Freq: Once | INTRAVENOUS | Status: AC
  Administered 2020-09-08: 250 mg via INTRAVENOUS
  Filled 2020-09-08: qty 20

## 2020-09-08 MED ORDER — PANTOPRAZOLE SODIUM 40 MG PO TBEC
40.0000 mg | DELAYED_RELEASE_TABLET | Freq: Every day | ORAL | Status: DC
  Administered 2020-09-08 – 2020-09-10 (×3): 40 mg via ORAL
  Filled 2020-09-08 (×3): qty 1

## 2020-09-08 MED ORDER — METOLAZONE 5 MG PO TABS
5.0000 mg | ORAL_TABLET | Freq: Every day | ORAL | Status: DC
  Administered 2020-09-08: 5 mg via ORAL
  Filled 2020-09-08 (×2): qty 1

## 2020-09-08 MED ORDER — ALUM & MAG HYDROXIDE-SIMETH 200-200-20 MG/5ML PO SUSP: 30.0000 mL | Freq: Four times a day (QID) | ORAL | Status: DC | PRN

## 2020-09-08 NOTE — Progress Notes (Signed)
PROGRESS NOTE    [name] [name]   [id number]  DOB: 11-04-1960  DOA: 08/28/2020     11  PCP: [name] Pepper, MD  CC: swelling   Hospital Course: Mr. [name] [name] is a 60 year old male with PMH hypertension, hyperlipidemia, DM II who presented to the hospital with worsening renal function and generalized swelling.  He has undergone extensive work-up from nephrology regarding etiologies.  Renal biopsy was also obtained.  Results revealed nodular diabetic glomerulosclerosis.  He was started on aggressive diuretic regimen as well.  Interval History:  Seen this morning with family present bedside.  Has been diuresing fairly well on IV diuresis which decreased some when changed to oral diuretic. He notes some improvement in his swelling in general but states still has a significant amount he notes.  ROS: Constitutional: negative for chills and fevers, Respiratory: negative for cough, Cardiovascular: negative for chest pain and Gastrointestinal: negative for abdominal pain  Assessment & Plan: Nodular diabetic glomerulosclerosis AKI nonoliguric UA with 10.9 g of proteinuria. -Renal biopsy has returned noting nodular diabetic glomerulosclerosis per nephrology -Extensive work-up also completed to rule out other etiologies -Per nephrology, may benefit from SGLT 2 inhibitor in the outpatient setting -Continue torsemide; metolazone also added today per nephrology -Continue holding lisinopril; goal is to eventually add back -Continue monitoring output  HFrEF, EF 40-45% Gr II DD Elevated RVSP (86 mmHg) on echo Pericardial effusion  Pleural effusion - given anasarca, concern for possible contribution from CHF in addition to diabetic kidney disease vs component of CRS - will consult cardiology in am for further assistance - continue diuresis for now and monitor renal function   Anemia of chronic kidney disease Iron deficiency Received IV iron. -Transfuse for Hgb <7 g/dL (possibly higher  threshold pending cardiology evaluation) - CBC daily   Essential hypertension -Continue hydralazine, Imdur, Lopressor -Continue torsemide and metolazone  Hypokalemia Replete as needed.  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 Hemoglobin A1c 5.3. Currently on sensitive sliding scale.  Acute hypoxic respiratory failure Currently needing 3 L of oxygen -Wean as able  Obesity Placing the patient at high risk for poor outcome in the setting of cardiac and renal dysfunction. Body mass index is 28.67 kg/m.   Cough.  Now resolved. Appears dry in nature. Patient reports multiple medications are causing him to choke. We will minimize medication. Recommended patient not to cough to clear his throat as it will only lead to more irritation and more coughing. Cough suppressant medication as ordered.  Resolved.  Nosebleed.  Resolved. Very minor. Monitor for now. Recommended patient not to irritate his nasal mucosa.  Old records reviewed in assessment of this patient  Antimicrobials:   DVT prophylaxis: Place TED hose Start: 09/05/20 1035 heparin injection 5,000 Units Start: 09/04/20 2200   Code Status:   Code Status: Full Code Family Communication:   Disposition Plan: Status is: Inpatient  Remains inpatient appropriate because:IV treatments appropriate due to intensity of illness or inability to take PO and Inpatient level of care appropriate due to severity of illness   Dispo:  Patient From: Home  Planned Disposition: Home with Health Care Svc  Medically stable for discharge: No        Risk of unplanned readmission score: Unplanned Admission- Pilot do not use: 21.48   Objective: Blood pressure (!) 170/78, pulse 66, temperature 98.5 F (36.9 C), resp. rate 18, weight 84.9 kg, SpO2 97 %.  Examination: General appearance: alert,  cooperative and no  distress Head: Normocephalic, without obvious abnormality, atraumatic Eyes: EOMI Lungs: coarse breath sounds bilaterally Heart: regular rate and rhythm and S1, S2 normal Abdomen: normal findings: bowel sounds normal and soft, non-tender Extremities: edema noted throughout Skin: mobility and turgor normal Neurologic: Grossly normal  Consultants:   Nephrology   Procedures:   Left Renal biopsy: 3/25  Data Reviewed: I have personally reviewed following labs and imaging studies Results for orders placed or performed during the hospital encounter of 08/28/20 (from the past 24 hour(s))  Glucose, capillary     Status: Abnormal   Collection Time: 09/07/20  9:15 PM  Result Value Ref Range   Glucose-Capillary 125 (H) 70 - 99 mg/dL  Magnesium     Status: None   Collection Time: 09/08/20  3:27 AM  Result Value Ref Range   Magnesium 2.0 1.7 - 2.4 mg/dL  Renal function panel     Status: Abnormal   Collection Time: 09/08/20  3:27 AM  Result Value Ref Range   Sodium 131 (L) 135 - 145 mmol/L   Potassium 3.8 3.5 - 5.1 mmol/L   Chloride 96 (L) 98 - 111 mmol/L   CO2 30 22 - 32 mmol/L   Glucose, Bld 104 (H) 70 - 99 mg/dL   BUN 44 (H) 8 - 23 mg/dL   Creatinine, Ser 2.59 (H) 0.61 - 1.24 mg/dL   Calcium 8.1 (L) 8.9 - 10.3 mg/dL   Phosphorus 4.9 (H) 2.5 - 4.6 mg/dL   Albumin 2.2 (L) 3.5 - 5.0 g/dL   GFR, Estimated 27 (L) >60 mL/min   Anion gap 5 5 - 15  CBC     Status: Abnormal   Collection Time: 09/08/20  3:27 AM  Result Value Ref Range   WBC 5.0 4.0 - 10.5 K/uL   RBC 2.54 (L) 4.22 - 5.81 MIL/uL   Hemoglobin 7.5 (L) 13.0 - 17.0 g/dL   HCT 22.3 (L) 39.0 - 52.0 %   MCV 87.8 80.0 - 100.0 fL   MCH 29.5 26.0 - 34.0 pg   MCHC 33.6 30.0 - 36.0 g/dL   RDW 13.1 11.5 - 15.5 %   Platelets 204 150 - 400 K/uL   nRBC 0.0 0.0 - 0.2 %  Glucose, capillary     Status: None   Collection Time: 09/08/20  6:41 AM  Result Value Ref Range   Glucose-Capillary 83 70 - 99 mg/dL  Glucose, capillary      Status: Abnormal   Collection Time: 09/08/20  7:58 AM  Result Value Ref Range   Glucose-Capillary 103 (H) 70 - 99 mg/dL  Glucose, capillary     Status: Abnormal   Collection Time: 09/08/20 11:14 AM  Result Value Ref Range   Glucose-Capillary 123 (H) 70 - 99 mg/dL    No results found for this or any previous visit (from the past 240 hour(s)).   Radiology Studies: DG CHEST PORT 1 VIEW  Result Date: 09/07/2020 CLINICAL DATA:  Pleural effusion EXAM: PORTABLE CHEST 1 VIEW COMPARISON:  09/02/2020 chest radiograph. FINDINGS: Stable cardiomediastinal silhouette with mild cardiomegaly. No pneumothorax. Small right and small to moderate left pleural effusions, similar. Mild pulmonary edema, similar. Bibasilar atelectasis, similar. IMPRESSION: Stable mild congestive heart failure with small right and small to moderate left pleural effusions and bibasilar atelectasis. Electronically Signed   By: Ilona Sorrel M.D.   On: 09/07/2020 07:20   DG CHEST PORT 1 VIEW  Final Result    US BIOPSY (KIDNEY)  Final Result  DG Chest 2 View  Final Result    DG Chest 2 View  Final Result      Scheduled Meds: . amLODipine  10 mg Oral Daily  . dextromethorphan-guaiFENesin  1 tablet Oral BID  . gabapentin  100 mg Oral Daily  . heparin  5,000 Units Subcutaneous Q8H  . hydrALAZINE  25 mg Oral Q8H  . insulin aspart  0-9 Units Subcutaneous TID WC  . isosorbide mononitrate  30 mg Oral Daily  . metolazone  5 mg Oral Daily  . metoprolol tartrate  50 mg Oral BID  . multivitamin  1 tablet Oral QHS  . pantoprazole  40 mg Oral Daily  . sodium chloride flush  3 mL Intravenous Q12H  . torsemide  100 mg Oral Daily   PRN Meds: sodium chloride, acetaminophen **OR** acetaminophen, alum & mag hydroxide-simeth, chlorpheniramine-HYDROcodone, polyethylene glycol, sodium chloride, sodium chloride flush, traZODone Continuous Infusions: . sodium chloride       LOS: 11 days  Time spent: Greater than 50% of the 35 minute  visit was spent in counseling/coordination of care for the patient as laid out in the A&P.   Dwyane Dee, MD [facility] 09/08/2020, 6:22 PM

## 2020-09-08 NOTE — Progress Notes (Signed)
Occupational Therapy Treatment Patient Details Name: [name] [name] MRN: [id number] DOB: 12-17-1960 Today's Date: 09/08/2020    History of present illness Pt is 60 year old gentleman admitted on 08/28/20.  He has been recently followed up for nephrotic syndrome with gross proteinuria as outpatient admitted with worsening lower extremity swelling and anasarca with fluid overload and AKI.  Sent from nephrology office. S/p L kidney biposy 3/25.  Pt with history of hypertension, hyperlipidemia, type 2 diabetes   OT comments  Pt received on [facility] w/ mom in the room. Pt agreeable to OT treatment session. Pt required max verbal cueing this session for safety w/ mobility and transfers using RW. Additionally, pt reported high pain levels with scrotum swelling, limiting him from activity. Pt required max encouragement to participate in functional activities such as hand hygiene and toileting hygiene this session. Pt's goal is to discharge home, however he lives with his mom, who also has mobility deficits. OT's recommendation at this time is to discharge to a SNF to continue working on goals in a skilled setting, for safety of himself and his mom. OT will continue to follow acutely.   Follow Up Recommendations  SNF;Supervision/Assistance - 24 hour    Equipment Recommendations  3 in 1 bedside commode    Recommendations for Other Services      Precautions / Restrictions Precautions Precautions: Fall Restrictions Weight Bearing Restrictions: No       Mobility Bed Mobility                    Transfers Overall transfer level: Needs assistance Equipment used: Rolling walker (2 wheeled) Transfers: Sit to/from Stand Sit to Stand: Mod assist Stand pivot transfers: Mod assist       General transfer comment: Pt needs cueing for safety with use of DME and positioning during functional mobility.    Balance Overall balance assessment: Needs assistance Sitting-balance support: Feet  supported Sitting balance-Leahy Scale: Good     Standing balance support: Bilateral upper extremity supported Standing balance-Leahy Scale: Poor Standing balance comment: Pt requiring UE support and with tendency to posterior lean upon initial stand and forward momentum with gait                           ADL either performed or assessed with clinical judgement   ADL Overall ADL's : Needs assistance/impaired Eating/Feeding: Set up;Sitting   Grooming: Wash/dry face;Wash/dry hands;Set up;Sitting Grooming Details (indicate cue type and reason): Pt refused to stand at sink for any grooming tasks due to fatigue and pain. W/ verbal encouragment pt completed grooming seated EOB. Upper Body Bathing: Minimal assistance;Sitting   Lower Body Bathing: Moderate assistance;Cueing for compensatory techniques;Sitting/lateral leans;Sit to/from stand   Upper Body Dressing : Minimal assistance;Sitting   Lower Body Dressing: Moderate assistance;Sitting/lateral leans;Sit to/from stand   Toilet Transfer: Biomedical scientist Details (indicate cue type and reason): Bed height elevated, pt txr from [facility] to EOB w/ mod A and RW. Toileting- Clothing Manipulation and Hygiene: Maximal assistance;Sit to/from stand;Sitting/lateral lean       Functional mobility during ADLs: Moderate assistance;Cueing for safety;Rolling walker General ADL Comments: max encouragement for participation-agreeable to oob to recliner.     Vision   Vision Assessment?: No apparent visual deficits   Perception     Praxis      Cognition Arousal/Alertness: Awake/alert Behavior During Therapy: Agitated Overall Cognitive Status: Impaired/Different from baseline Area of Impairment: Safety/judgement  Current Attention Level: Selective   Following Commands: Follows one step commands consistently;Follows multi-step commands with increased time Safety/Judgement: Decreased awareness of  safety;Decreased awareness of deficits Awareness: Emergent Problem Solving: Requires verbal cues;Difficulty sequencing General Comments: Pt with decreased motivation and needs max encouragement to mobilize OOB in order to progress towards home        Exercises     Shoulder Instructions       General Comments Pt is self limiting. He requires max verbal cueing to complete activities such as hand hygiene after toileting and transferring to recliner. Additionally, pt has edema in BLE and BUE.    Pertinent Vitals/ Pain       Pain Assessment: 0-10 Pain Score: 7  Faces Pain Scale: Hurts whole lot Pain Location: swollen scrotum Pain Descriptors / Indicators: Discomfort Pain Intervention(s): Repositioned;Monitored during session;Limited activity within patient's tolerance  Home Living                                          Prior Functioning/Environment              Frequency  Min 2X/week        Progress Toward Goals  OT Goals(current goals can now be found in the care plan section)  Progress towards OT goals: Progressing toward goals  Acute Rehab OT Goals Patient Stated Goal: Go home OT Goal Formulation: With patient/family Time For Goal Achievement: 09/14/20 Potential to Achieve Goals: Good ADL Goals Pt Will Perform Grooming: with supervision;standing Pt Will Perform Lower Body Dressing: with supervision;sitting/lateral leans;sit to/from stand Pt Will Transfer to Toilet: with supervision;ambulating Pt Will Perform Toileting - Clothing Manipulation and hygiene: with supervision;sit to/from stand;sitting/lateral leans Additional ADL Goal #1: Pt to verbalize at least 3 energy conservation strategies to implement during daily tasks Additional ADL Goal #2: Pt to demo bed mobility at Supervision level as precursor to ADLs  Plan Discharge plan needs to be updated;Frequency remains appropriate    Co-evaluation                 AM-PAC OT "6  Clicks" Daily Activity     Outcome Measure   Help from another person eating meals?: None Help from another person taking care of personal grooming?: A Little Help from another person toileting, which includes using toliet, bedpan, or urinal?: A Lot Help from another person bathing (including washing, rinsing, drying)?: A Lot Help from another person to put on and taking off regular upper body clothing?: A Little Help from another person to put on and taking off regular lower body clothing?: A Lot 6 Click Score: 16    End of Session Equipment Utilized During Treatment: Gait belt;Rolling walker;Oxygen  OT Visit Diagnosis: Unsteadiness on feet (R26.81);Other abnormalities of gait and mobility (R26.89);Muscle weakness (generalized) (M62.81)   Activity Tolerance Patient limited by pain   Patient Left in chair;with call bell/phone within reach;with chair alarm set   Nurse Communication Mobility status        Time: [id number] OT Time Calculation (min): 25 min  Charges: OT Treatments $Self Care/Home Management : 23-37 mins  Anhthu Perdew H., OTR/L Acute Rehabilitation [phone]  Kendalyn Cranfield Elane Yolanda Bonine 09/08/2020, 2:56 PM

## 2020-09-08 NOTE — Progress Notes (Signed)
[name] NEPHR[facility] PROGRESS NOTE  Assessment/ Plan: Pt is a 60 y.o. yo male  With [name], [name] [name], [name] [name], [name] [name], [name] [name] likely has diabetic nephropathy but there is a concern for podocytopathy.  However with microscopic hematuria and AKI need to rule out underlying glomerular disease. Extensive serology test done as outpatient unremarkable, including ANA, ANCA, C3, C4, hepatitis B, C, HIV, RPR negative.  SPEP with no M spike and kappa lambda ratio was 0.95. Biopsy with nodular diabetic glomerulosclerosis. -may benefit from SGLT2i initiated in outpt setting  - Didn't diurese well with torsemide 100 yesterday so added metolazone '5mg'$  today; trying to do a regimen [name] can go home on to continue diuresing.   -daily weights ordered, strict I/O  #Acute kidney injury, nonoliguric: Probably hemodynamically mediated with reduced renal perfusion and ACE inhibitor.  Agree with holding lisinopril.  Diuretics to offload volume and to help renal perfusion. Avoid nephrotoxins or hypotensive episode. - Noted is slight uptick in Cr today but still quite overloaded; may need to hold a dose tomorrow and re-equilibrate if up further. -ultimately would like to add back ACEi given DKD but in midst of aggressive diuresis will hold for now  #Anemia probably due to chronic disease and dilution: Iron saturation 12% - had 1 dose ferric gluconate '250mg'$ , will redose today.  If Hb not trending up with iron will need ESA initiated outpt  #Hypertension:  Acceptably controlled. Continue with norvasc '10mg'$  daily, lopressor '50mg'$  BID (titration limited by HR), and diuretics per above.  #Uncontrolled type 2 diabetes: per primary team.  Subjective: [name] in room, mom bedside.  Overall  feeling improved - edema improving but not to baseline. Declining SNF recommendation.  I/Os 1.3/0.5L, net neg 7.6L for admission per I/Os.   Objective Vital signs in last 24 hours: Vitals:   09/07/20 1652 09/07/20 2113 09/08/20 0516 09/08/20 1118  BP: 127/62 125/63 135/66 (!) 170/78  Pulse: (!) 51 (!) 50 (!) 48 66  Resp: '17 17 17 18  '$ Temp: 98.4 F (36.9 C) 97.8 F (36.6 C) 97.6 F (36.4 C) 98.5 F (36.9 C)  TempSrc: Oral Oral Oral   SpO2: 100% 99% 100% 97%  Weight:  84.9 kg     Weight change: 0.003 kg  Intake/Output Summary (Last 24 hours) at 09/08/2020 1232 Last data filed at 09/08/2020 0600 Gross per 24 hour  Intake 820 ml  Output 500 ml  Net 320 ml       Labs: Basic Metabolic Panel: Recent Labs  Lab 09/06/20 0443 09/07/20 0327 09/08/20 0327  NA 134* 132* 131*  K 3.8 3.7 3.8  CL 98 97* 96*  CO2 '29 29 30  '$ GLUCOSE 103* 133* 104*  BUN 41* 41* 44*  CREATININE 2.51* 2.46* 2.59*  CALCIUM 8.1* 8.0* 8.1*  PHOS 4.4 4.6 4.9*   Liver Function Tests: Recent Labs  Lab 09/06/20 0443 09/07/20 0327 09/08/20 0327  ALBUMIN 2.4* 2.2* 2.2*   No results for input(s): LIPASE, AMYLASE in the last 168 hours. No results for input(s): AMMONIA in the last 168 hours. CBC: Recent Labs  Lab 09/05/20 0738 09/06/20 0443 09/06/20 1215 09/07/20 0327 09/08/20 0327  WBC 6.1 6.3 7.3 5.4 5.0  HGB 8.4* 7.4* 8.7* 7.2* 7.5*  HCT 25.4* 22.6* 26.0* 21.3* 22.3*  MCV 89.4  89.3 89.0 88.0 87.8  PLT 230 201 247 195 204   Cardiac Enzymes: No results for input(s): CKTOTAL, CKMB, CKMBINDEX, TROPONINI in the last 168 hours. CBG: Recent Labs  Lab 09/07/20 1235 09/07/20 1651 09/07/20 2115 09/08/20 0641 09/08/20 0758  GLUCAP 134* 175* 125* 83 103*    Iron Studies:  No results for input(s): IRON, TIBC, TRANSFERRIN, FERRITIN in the last 72 hours. Studies/Results: DG CHEST PORT 1 VIEW  Result Date: 09/07/2020 CLINICAL DATA:  Pleural effusion EXAM: PORTABLE CHEST 1 VIEW COMPARISON:   09/02/2020 chest radiograph. FINDINGS: Stable cardiomediastinal silhouette with mild cardiomegaly. No pneumothorax. Small right and small to moderate left pleural effusions, similar. Mild pulmonary edema, similar. Bibasilar atelectasis, similar. IMPRESSION: Stable mild congestive heart failure with small right and small to moderate left pleural effusions and bibasilar atelectasis. Electronically Signed   By: Ilona Sorrel M.D.   On: 09/07/2020 07:20    Medications: Infusions: . sodium chloride      Scheduled Medications: . amLODipine  10 mg Oral Daily  . dextromethorphan-guaiFENesin  1 tablet Oral BID  . gabapentin  100 mg Oral Daily  . heparin  5,000 Units Subcutaneous Q8H  . hydrALAZINE  25 mg Oral Q8H  . insulin aspart  0-9 Units Subcutaneous TID WC  . isosorbide mononitrate  30 mg Oral Daily  . metolazone  5 mg Oral Daily  . metoprolol tartrate  50 mg Oral BID  . multivitamin  1 tablet Oral QHS  . pantoprazole  40 mg Oral Daily  . sodium chloride flush  3 mL Intravenous Q12H  . torsemide  100 mg Oral Daily    have reviewed scheduled and prn medications.  Physical Exam: General:NAD, sitting up in chair Heart:RRR, s1s2 nl Lungs: no iwob, unlabored, bl chest expansion Abdomen:soft, Non-tender, non-distended Extremities: 2+ pitting midway to knees Neurology: Alert awake and following commands GU; condom cath with urine in bad  Justin Mend 09/08/2020,12:32 PM  LOS: 11 days

## 2020-09-08 NOTE — Plan of Care (Signed)
  Problem: Health Behavior/Discharge Planning: Goal: Ability to manage health-related needs will improve Outcome: Progressing   Problem: Clinical Measurements: Goal: Ability to maintain clinical measurements within normal limits will improve Outcome: Progressing   Problem: Activity: Goal: Risk for activity intolerance will decrease Outcome: Progressing   Problem: Nutrition: Goal: Adequate nutrition will be maintained Outcome: Progressing   Problem: Coping: Goal: Level of anxiety will decrease Outcome: Progressing   Problem: Safety: Goal: Ability to remain free from injury will improve Outcome: Progressing   Problem: Skin Integrity: Goal: Risk for impaired skin integrity will decrease Outcome: Progressing   

## 2020-09-09 ENCOUNTER — Encounter (HOSPITAL_COMMUNITY): Payer: Self-pay | Admitting: Internal Medicine

## 2020-09-09 ENCOUNTER — Encounter (HOSPITAL_COMMUNITY): Payer: Self-pay

## 2020-09-09 ENCOUNTER — Other Ambulatory Visit: Payer: Self-pay | Admitting: Physician Assistant

## 2020-09-09 ENCOUNTER — Other Ambulatory Visit (HOSPITAL_COMMUNITY): Payer: Self-pay

## 2020-09-09 DIAGNOSIS — J9601 Acute respiratory failure with hypoxia: Secondary | ICD-10-CM

## 2020-09-09 DIAGNOSIS — I5043 Acute on chronic combined systolic (congestive) and diastolic (congestive) heart failure: Secondary | ICD-10-CM

## 2020-09-09 DIAGNOSIS — E782 Mixed hyperlipidemia: Secondary | ICD-10-CM

## 2020-09-09 DIAGNOSIS — R601 Generalized edema: Secondary | ICD-10-CM | POA: Diagnosis not present

## 2020-09-09 DIAGNOSIS — I272 Pulmonary hypertension, unspecified: Secondary | ICD-10-CM

## 2020-09-09 DIAGNOSIS — N049 Nephrotic syndrome with unspecified morphologic changes: Secondary | ICD-10-CM

## 2020-09-09 DIAGNOSIS — I10 Essential (primary) hypertension: Secondary | ICD-10-CM

## 2020-09-09 DIAGNOSIS — E1165 Type 2 diabetes mellitus with hyperglycemia: Secondary | ICD-10-CM

## 2020-09-09 DIAGNOSIS — E1121 Type 2 diabetes mellitus with diabetic nephropathy: Principal | ICD-10-CM

## 2020-09-09 DIAGNOSIS — D638 Anemia in other chronic diseases classified elsewhere: Secondary | ICD-10-CM

## 2020-09-09 DIAGNOSIS — I429 Cardiomyopathy, unspecified: Secondary | ICD-10-CM

## 2020-09-09 LAB — RENAL FUNCTION PANEL
Albumin: 2.3 g/dL — ABNORMAL LOW (ref 3.5–5.0)
Anion gap: 7 (ref 5–15)
BUN: 43 mg/dL — ABNORMAL HIGH (ref 8–23)
CO2: 28 mmol/L (ref 22–32)
Calcium: 8.1 mg/dL — ABNORMAL LOW (ref 8.9–10.3)
Chloride: 93 mmol/L — ABNORMAL LOW (ref 98–111)
Creatinine, Ser: 2.57 mg/dL — ABNORMAL HIGH (ref 0.61–1.24)
GFR, Estimated: 28 mL/min — ABNORMAL LOW (ref >60)
Glucose, Bld: 141 mg/dL — ABNORMAL HIGH (ref 70–99)
Phosphorus: 4.7 mg/dL — ABNORMAL HIGH (ref 2.5–4.6)
Potassium: 3.6 mmol/L (ref 3.5–5.1)
Sodium: 128 mmol/L — ABNORMAL LOW (ref 135–145)

## 2020-09-09 LAB — GLUCOSE, CAPILLARY
Glucose-Capillary: 115 mg/dL — ABNORMAL HIGH (ref 70–99)
Glucose-Capillary: 149 mg/dL — ABNORMAL HIGH (ref 70–99)
Glucose-Capillary: 104 mg/dL — ABNORMAL HIGH (ref 70–99)
Glucose-Capillary: 140 mg/dL — ABNORMAL HIGH (ref 70–99)

## 2020-09-09 LAB — CBC
HCT: 24.1 % — ABNORMAL LOW (ref 39.0–52.0)
Hemoglobin: 7.9 g/dL — ABNORMAL LOW (ref 13.0–17.0)
MCH: 29.2 pg (ref 26.0–34.0)
MCHC: 32.8 g/dL (ref 30.0–36.0)
MCV: 88.9 fL (ref 80.0–100.0)
Platelets: 242 10*3/uL (ref 150–400)
RBC: 2.71 MIL/uL — ABNORMAL LOW (ref 4.22–5.81)
RDW: 13 % (ref 11.5–15.5)
WBC: 4.9 10*3/uL (ref 4.0–10.5)
nRBC: 0 % (ref 0.0–0.2)

## 2020-09-09 LAB — MAGNESIUM: Magnesium: 2 mg/dL (ref 1.7–2.4)

## 2020-09-09 NOTE — Progress Notes (Signed)
[facility]GRESS [facility]  Assessment/ Plan: Pt is a 60 y.o. yo male  With HTN, HLD, DM, recently seen at [facility] for nephrotic syndrome presents with worsening lower extremity edema/anasarca, seen as a consultation for the management of fluid overload and AKI.  #Nephrotic syndrome with anasarca:  UA with 10.9 g of proteinuria.  He likely has diabetic nephropathy but there is a concern for podocytopathy.  However with microscopic hematuria and AKI need to rule out underlying glomerular disease. Extensive serology test done as outpatient unremarkable, including ANA, ANCA, C3, C4, hepatitis B, C, HIV, RPR negative.  SPEP with no M spike and kappa lambda ratio was 0.95. Biopsy with nodular diabetic glomerulosclerosis. - Didn't diurese great with torsemide 100 alone so added metolazone '5mg'$  yesterday - much better diuresis but now Cr up a bit to 2.6 and sodium 128 so will hold metolazone today - I think a reasonable regimen for home will be torsemide 100 daily with metolazone 2.5 QOD until he has close f/u at [facility] next week.   -daily weights ordered, strict I/O -reiterated low na diet - was eating [employer] yesterday  #Acute kidney injury, nonoliguric: Probably hemodynamically mediated with reduced renal perfusion and ACE inhibitor.  Agree with holding lisinopril.  Diuretics to offload volume and to help renal perfusion. Avoid nephrotoxins or hypotensive episode. - Noted is slight uptick in Cr today but still quite overloaded; per above cont torsemide and hold metolazone today -ultimately will add back ACEi given DKD but in midst of aggressive diuresis will hold for now; hopefully at f/u visit it can be resumed  #Anemia probably due to chronic disease and dilution: Iron saturation 12% - has had ferric gluconate '250mg'$ x 2 this admission.  If Hb not trending up with iron will need ESA initiated outpt  #Hypertension:  Acceptably controlled. Continue with norvasc '10mg'$   daily, lopressor '50mg'$  BID (titration limited by HR), and diuretics per above.  #type 2 diabetes: h/o poor control, now well controlled. per primary team.  Dispo:  Ok to d/c from my perspective with the above noted diuretic regimen; f/u with be at [facility] office next week and he will be notified of date/time - he's aware  Subjective: Seen in room, alone today.  Overall feeling improved - edema improving but not to baseline. Declining SNF recommendation.  I/Os 0.34/2.075L, net neg 9.3L for admission per I/Os.   Objective Vital signs in last 24 hours: Vitals:   09/08/20 1118 09/08/20 1900 09/08/20 2130 09/09/20 0500  BP: (!) 170/78 (!) 143/49 131/67 138/75  Pulse: 66 (!) 51 (!) 49 69  Resp: '18 18 18 18  '$ Temp: 98.5 F (36.9 C) 97.7 F (36.5 C) 98.4 F (36.9 C) 98.5 F (36.9 C)  TempSrc:  Oral  Oral  SpO2: 97% 99% 97% 98%  Weight:       Weight change:   Intake/Output Summary (Last 24 hours) at 09/09/2020 0826 Last data filed at 09/09/2020 [id number] Gross per 24 hour  Intake 340 ml  Output 2075 ml  Net -1735 ml       Labs: Basic Metabolic Panel: Recent Labs  Lab 09/06/20 0443 09/07/20 0327 09/08/20 0327  NA 134* 132* 131*  K 3.8 3.7 3.8  CL 98 97* 96*  CO2 '29 29 30  '$ GLUCOSE 103* 133* 104*  BUN 41* 41* 44*  CREATININE 2.51* 2.46* 2.59*  CALCIUM 8.1* 8.0* 8.1*  PHOS 4.4 4.6 4.9*   Liver Function Tests: Recent Labs  Lab 09/06/20 0443 09/07/20  0327 09/08/20 0327  ALBUMIN 2.4* 2.2* 2.2*   No results for input(s): LIPASE, AMYLASE in the last 168 hours. No results for input(s): AMMONIA in the last 168 hours. CBC: Recent Labs  Lab 09/05/20 0738 09/06/20 0443 09/06/20 1215 09/07/20 0327 09/08/20 0327  WBC 6.1 6.3 7.3 5.4 5.0  HGB 8.4* 7.4* 8.7* 7.2* 7.5*  HCT 25.4* 22.6* 26.0* 21.3* 22.3*  MCV 89.4 89.3 89.0 88.0 87.8  PLT 230 201 247 195 204   Cardiac Enzymes: No results for input(s): CKTOTAL, CKMB, CKMBINDEX, TROPONINI in the last 168 hours. CBG: Recent Labs   Lab 09/07/20 1651 09/07/20 2115 09/08/20 0641 09/08/20 0758 09/08/20 1114  GLUCAP 175* 125* 83 103* 123*    Iron Studies:  No results for input(s): IRON, TIBC, TRANSFERRIN, FERRITIN in the last 72 hours. Studies/Results: No results found.  Medications: Infusions: . sodium chloride      Scheduled Medications: . amLODipine  10 mg Oral Daily  . dextromethorphan-guaiFENesin  1 tablet Oral BID  . gabapentin  100 mg Oral Daily  . heparin  5,000 Units Subcutaneous Q8H  . hydrALAZINE  25 mg Oral Q8H  . insulin aspart  0-9 Units Subcutaneous TID WC  . isosorbide mononitrate  30 mg Oral Daily  . metoprolol tartrate  50 mg Oral BID  . multivitamin  1 tablet Oral QHS  . pantoprazole  40 mg Oral Daily  . sodium chloride flush  3 mL Intravenous Q12H  . torsemide  100 mg Oral Daily    have reviewed scheduled and prn medications.  Physical Exam: General:NAD, sitting up in chair Heart:RRR, s1s2 nl Lungs: no iwob, unlabored, bl chest expansion Abdomen:soft, Non-tender, non-distended Extremities: 1+ pitting midway to knees - improving Neurology: Alert awake and following commands GU; condom cath with urine in bad  Justin Mend 09/09/2020,8:26 AM  LOS: 12 days

## 2020-09-09 NOTE — Progress Notes (Signed)
PROGRESS NOTE    [name] [name]   [id number]  DOB: 1960-07-06  DOA: 08/28/2020     12  PCP: [name], MD  CC: swelling   Hospital Course: [name] [name] is a 60 year old male with PMH hypertension, hyperlipidemia, DM II who presented to the hospital with worsening renal function and generalized swelling.  He has undergone extensive work-up from nephrology regarding etiologies.  Renal biopsy was also obtained.  Results revealed nodular diabetic glomerulosclerosis.  He was started on aggressive diuretic regimen as well.  Interval History:  No events overnight.  States his breathing still feels more comfortable and improvement in swelling. Urination improved further since yesterday.   ROS: Constitutional: negative for chills and fevers, Respiratory: negative for cough, Cardiovascular: negative for chest pain and Gastrointestinal: negative for abdominal pain  Assessment & Plan: Nodular diabetic glomerulosclerosis AKI nonoliguric UA with 10.9 g of proteinuria. -Renal biopsy has returned noting nodular diabetic glomerulosclerosis per nephrology -Extensive work-up also completed to rule out other etiologies -Per nephrology, may benefit from SGLT 2 inhibitor in the outpatient setting -Continue torsemide; metolazone also added 3/29 per nephrology -Continue holding lisinopril; goal is to eventually add back -Continue monitoring output; picking up more with torsemide/metolazone  HFrEF, EF 40-45% Gr II DD Elevated RVSP (86 mmHg) on echo Pericardial effusion  Pleural effusion - given anasarca, concern for possible contribution from CHF in addition to diabetic kidney disease vs component of CRS - will consult cardiology for further input - continue diuresis for now and monitor renal function   Anemia of chronic kidney disease Iron deficiency Received IV iron. -Transfuse for Hgb <7 g/dL (possibly higher threshold pending cardiology evaluation) - CBC daily   Essential  hypertension -Continue hydralazine, Imdur, Lopressor -Continue torsemide and metolazone  Hypokalemia Replete as needed.  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 Hemoglobin A1c 5.3. - continue SSI  Acute hypoxic respiratory failure Currently needing 3 L of oxygen -Wean as able  Obesity Placing the patient at high risk for poor outcome in the setting of cardiac and renal dysfunction. Body mass index is 28.67 kg/m.   Cough - resolved  Appears dry in nature. Patient reports multiple medications are causing him to choke Recommended patient not to cough to clear his throat as it will only lead to more irritation and more coughing. Cough suppressant medication as ordered.  Resolved.  Nosebleed.  Resolved. Very minor. Monitor for now. Recommended patient not to irritate his nasal mucosa.  Old records reviewed in assessment of this patient  Antimicrobials:   DVT prophylaxis: Place TED hose Start: 09/05/20 1035 heparin injection 5,000 Units Start: 09/04/20 2200   Code Status:   Code Status: Full Code Family Communication:   Disposition Plan: Status is: Inpatient  Remains inpatient appropriate because:IV treatments appropriate due to intensity of illness or inability to take PO and Inpatient level of care appropriate due to severity of illness   Dispo:  Patient From: Home  Planned Disposition: Home with Health Care Svc  Medically stable for discharge: No     Risk of unplanned readmission score: Unplanned Admission- Pilot do not use: 21.52   Objective: Blood pressure (!) 163/87, pulse 63, temperature 98.5 F (36.9 C), temperature source Oral, resp. rate 17, weight 84.9 kg, SpO2 94 %.  Examination: General appearance: alert, cooperative and no distress Head: Normocephalic, without obvious abnormality, atraumatic Eyes: EOMI Lungs: coarse breath sounds  bilaterally Heart: regular rate and rhythm and  S1, S2 normal Abdomen: normal findings: bowel sounds normal and soft, non-tender Extremities: edema noted throughout Skin: mobility and turgor normal Neurologic: Grossly normal  Consultants:   Nephrology   Cardiology  Procedures:   Left Renal biopsy: 3/25  Data Reviewed: I have personally reviewed following labs and imaging studies Results for orders placed or performed during the hospital encounter of 08/28/20 (from the past 24 hour(s))  Glucose, capillary     Status: Abnormal   Collection Time: 09/08/20  5:00 PM  Result Value Ref Range   Glucose-Capillary 115 (H) 70 - 99 mg/dL  Glucose, capillary     Status: Abnormal   Collection Time: 09/08/20  9:30 PM  Result Value Ref Range   Glucose-Capillary 149 (H) 70 - 99 mg/dL  Glucose, capillary     Status: Abnormal   Collection Time: 09/09/20  6:39 AM  Result Value Ref Range   Glucose-Capillary 104 (H) 70 - 99 mg/dL  Magnesium     Status: None   Collection Time: 09/09/20  8:48 AM  Result Value Ref Range   Magnesium 2.0 1.7 - 2.4 mg/dL  CBC     Status: Abnormal   Collection Time: 09/09/20  8:48 AM  Result Value Ref Range   WBC 4.9 4.0 - 10.5 K/uL   RBC 2.71 (L) 4.22 - 5.81 MIL/uL   Hemoglobin 7.9 (L) 13.0 - 17.0 g/dL   HCT 24.1 (L) 39.0 - 52.0 %   MCV 88.9 80.0 - 100.0 fL   MCH 29.2 26.0 - 34.0 pg   MCHC 32.8 30.0 - 36.0 g/dL   RDW 13.0 11.5 - 15.5 %   Platelets 242 150 - 400 K/uL   nRBC 0.0 0.0 - 0.2 %  Renal function panel     Status: Abnormal   Collection Time: 09/09/20  8:48 AM  Result Value Ref Range   Sodium 128 (L) 135 - 145 mmol/L   Potassium 3.6 3.5 - 5.1 mmol/L   Chloride 93 (L) 98 - 111 mmol/L   CO2 28 22 - 32 mmol/L   Glucose, Bld 141 (H) 70 - 99 mg/dL   BUN 43 (H) 8 - 23 mg/dL   Creatinine, Ser 2.57 (H) 0.61 - 1.24 mg/dL   Calcium 8.1 (L) 8.9 - 10.3 mg/dL   Phosphorus 4.7 (H) 2.5 - 4.6 mg/dL   Albumin 2.3 (L) 3.5 - 5.0 g/dL   GFR, Estimated 28 (L) >60  mL/min   Anion gap 7 5 - 15  Glucose, capillary     Status: Abnormal   Collection Time: 09/09/20 11:36 AM  Result Value Ref Range   Glucose-Capillary 140 (H) 70 - 99 mg/dL    No results found for this or any previous visit (from the past 240 hour(s)).   Radiology Studies: No results found. DG CHEST PORT 1 VIEW  Final Result    US BIOPSY (KIDNEY)  Final Result    DG Chest 2 View  Final Result    DG Chest 2 View  Final Result      Scheduled Meds: . amLODipine  10 mg Oral Daily  . dextromethorphan-guaiFENesin  1 tablet Oral BID  . gabapentin  100 mg Oral Daily  . heparin  5,000 Units Subcutaneous Q8H  . hydrALAZINE  25 mg Oral Q8H  . insulin aspart  0-9 Units Subcutaneous TID WC  . isosorbide mononitrate  30 mg Oral Daily  . metoprolol tartrate  50 mg Oral BID  . multivitamin  1 tablet Oral QHS  .  pantoprazole  40 mg Oral Daily  . sodium chloride flush  3 mL Intravenous Q12H  . torsemide  100 mg Oral Daily   PRN Meds: sodium chloride, acetaminophen **OR** acetaminophen, alum & mag hydroxide-simeth, chlorpheniramine-HYDROcodone, polyethylene glycol, sodium chloride, sodium chloride flush, traZODone Continuous Infusions: . sodium chloride       LOS: 12 days  Time spent: Greater than 50% of the 35 minute visit was spent in counseling/coordination of care for the patient as laid out in the A&P.   Dwyane Dee, MD [facility] 09/09/2020, 3:53 PM

## 2020-09-09 NOTE — Progress Notes (Signed)
Physical Therapy Treatment Note  Clinical Impression: SATURATION QUALIFICATIONS: (This note is used to comply with regulatory documentation for home oxygen)  Patient Saturations on Room Air at Rest = 91%  Patient Saturations on Room Air while Ambulating = 85%  Patient Saturations on 3 Liters of oxygen while Ambulating = 96%  Please briefly explain why patient needs home oxygen: Patient requires supplemental oxygen to maintain oxygen saturations at acceptable, safe levels with physical activity.  Roney [place], [place]  Acute Rehabilitation Services Pager [phone] Office [phone]

## 2020-09-09 NOTE — Progress Notes (Signed)
Orthopedic Tech Progress Note Patient Details:  [name] [name] 12-25-1960 [id number]  Ortho Devices Type of Ortho Device: Louretta Parma boot Ortho Device/Splint Location: BLE Ortho Device/Splint Interventions: Ordered,Application,Adjustment   Post Interventions Patient Tolerated: Well Instructions Provided: Care of device   Janit Pagan 09/09/2020, 10:50 AM

## 2020-09-09 NOTE — Progress Notes (Signed)
Occupational Therapy Treatment Patient Details Name: [name] [name] MRN: [id number] DOB: Aug 23, 1960 Today's Date: 09/09/2020    History of present illness Pt is 60 year old gentleman admitted on 08/28/20.  He has been recently followed up for nephrotic syndrome with gross proteinuria as outpatient admitted with worsening lower extremity swelling and anasarca with fluid overload and AKI.  Sent from nephrology office. S/p L kidney biposy 3/25.  Pt with history of hypertension, hyperlipidemia, type 2 diabetes   OT comments  Pt is progressing towards OT goals this session. OT was able to construct and don scrotal sling for pain management, edema management, for assist with mobility. Pt tolerated very well, and per PT decided improvement for pain management and mobility. Able to pull up socks EOB but overall mod A for LB ADL. Pt was mod A +2 for sit<>stand today and tolerated hallway ambulation with chair follow. Pt continued to demonstrate deficits into safety and abilities, and only has 81+ year old mother to assist him at home. If Pt declines SNF he will need HHOT. OT will continue to follow acutely.   Follow Up Recommendations  SNF;Supervision/Assistance - 24 hour (Pt does NOT want SNF, wants to go home)    Equipment Recommendations  3 in 1 bedside commode    Recommendations for Other Services      Precautions / Restrictions Precautions Precautions: Fall Precaution Comments: monitor O2 Restrictions Weight Bearing Restrictions: No       Mobility Bed Mobility Overal bed mobility: Needs Assistance Bed Mobility: Supine to Sit     Supine to sit: Mod assist;HOB elevated     General bed mobility comments: Pt is able to walk feet to EOB, assist for trunk elevation (pulls up on therapist) and assist with bed pad to bring hips EOB    Transfers Overall transfer level: Needs assistance Equipment used: Rolling walker (2 wheeled) Transfers: Sit to/from Stand Sit to Stand: Mod assist;+2  safety/equipment         General transfer comment: vc for safe hand placement and to scoot hips fwd to assist with improved biomechanics mod A +2 to come to standing from elevated bed    Balance Overall balance assessment: Needs assistance Sitting-balance support: Feet supported Sitting balance-Leahy Scale: Fair     Standing balance support: Bilateral upper extremity supported Standing balance-Leahy Scale: Poor Standing balance comment: Pt requiring UE support and with tendency to posterior lean upon initial stand and forward momentum with gait                           ADL either performed or assessed with clinical judgement   ADL Overall ADL's : Needs assistance/impaired                 Upper Body Dressing : Minimal assistance;Sitting Upper Body Dressing Details (indicate cue type and reason): to don gown like robe Lower Body Dressing: Moderate assistance;Sitting/lateral leans;Sit to/from stand Lower Body Dressing Details (indicate cue type and reason): Pt able to adjust socks while sitting EOB - will need significant assist for items like pants or underwear that require sit<>stand Toilet Transfer: RW;Ambulation;Min guard Toilet Transfer Details (indicate cue type and reason): simulated through hallway ambulation         Functional mobility during ADLs: Cueing for safety;Rolling walker;Min guard;Cueing for sequencing (mod A +2 to come to standing, min guard once on feet) General ADL Comments: max encouragement for participation-agreeable to oob to recliner.     Vision  Vision Assessment?: No apparent visual deficits   Perception     Praxis      Cognition Arousal/Alertness: Awake/alert Behavior During Therapy: Flat affect Overall Cognitive Status: Impaired/Different from baseline Area of Impairment: Safety/judgement                         Safety/Judgement: Decreased awareness of safety;Decreased awareness of deficits     General  Comments: Pt is focused on dc home. Pt will acknoelwdge that it takes 2 strong therapists to assist him into standing, and cannot provide adequate solution for how he will achieve this at home "I will be able to do it with my mom (who is in her 58's)"        Exercises     Shoulder Instructions       General Comments Scrotal sling created today for patient with focus on improved mobility, pain management, and edema management    Pertinent Vitals/ Pain       Pain Assessment: Faces Faces Pain Scale: Hurts little more Pain Location: swollen scrotum Pain Descriptors / Indicators: Discomfort Pain Intervention(s): Repositioned;Other (comment) (scrotal sling)  Home Living                                          Prior Functioning/Environment              Frequency  Min 2X/week        Progress Toward Goals  OT Goals(current goals can now be found in the care plan section)  Progress towards OT goals: Progressing toward goals  Acute Rehab OT Goals Patient Stated Goal: Go home OT Goal Formulation: With patient Time For Goal Achievement: 09/14/20 Potential to Achieve Goals: Good  Plan Frequency remains appropriate;Discharge plan remains appropriate    Co-evaluation    PT/OT/SLP Co-Evaluation/Treatment: Yes Reason for Co-Treatment: For patient/therapist safety;To address functional/ADL transfers;Other (comment) (scrotal sling safety/implementation) PT goals addressed during session: Mobility/safety with mobility;Balance;Proper use of DME;Strengthening/ROM OT goals addressed during session: ADL's and self-care;Proper use of Adaptive equipment and DME      AM-PAC OT "6 Clicks" Daily Activity     Outcome Measure   Help from another person eating meals?: None Help from another person taking care of personal grooming?: A Little Help from another person toileting, which includes using toliet, bedpan, or urinal?: A Lot Help from another person bathing  (including washing, rinsing, drying)?: A Lot Help from another person to put on and taking off regular upper body clothing?: A Little Help from another person to put on and taking off regular lower body clothing?: A Lot 6 Click Score: 16    End of Session Equipment Utilized During Treatment: Gait belt;Rolling walker;Oxygen  OT Visit Diagnosis: Unsteadiness on feet (R26.81);Other abnormalities of gait and mobility (R26.89);Muscle weakness (generalized) (M62.81);Other symptoms and signs involving cognitive function   Activity Tolerance Patient tolerated treatment well   Patient Left in chair;with call bell/phone within reach;with chair alarm set   Nurse Communication Mobility status;Other (comment) (scrotal sling in place)        Time: [id number] OT Time Calculation (min): 36 min  Charges: OT General Charges $OT Visit: 1 Visit OT Treatments $Self Care/Home Management : 8-22 mins  Jesse Sans OTR/L Acute Rehabilitation Services Pager: [phone] Office: [place] 09/09/2020, 12:44 PM

## 2020-09-09 NOTE — Plan of Care (Signed)
  Problem: Health Behavior/Discharge Planning: Goal: Ability to manage health-related needs will improve Outcome: Progressing   Problem: Clinical Measurements: Goal: Ability to maintain clinical measurements within normal limits will improve Outcome: Progressing   

## 2020-09-09 NOTE — Progress Notes (Signed)
Physical Therapy Treatment Patient Details Name: [name] [name] MRN: [id number] DOB: 16-Jun-1960 Today's Date: 09/09/2020    History of Present Illness Pt is 60 year old gentleman admitted on 08/28/20.  He has been recently followed up for nephrotic syndrome with gross proteinuria as outpatient admitted with worsening lower extremity swelling and anasarca with fluid overload and AKI.  Sent from nephrology office. S/p L kidney biposy 3/25.  Pt with history of hypertension, hyperlipidemia, type 2 diabetes    PT Comments    Continuing work on functional mobility and activity tolerance;  Assisted OT in applying a scrotal sling with the hopes of pt tolerating functional mobility better; Overall, [name] [name] showed less signs of scrotal discomfort during functional mobility tasks, including progressive amb; Noteworthy also is that he was able to walk farther; He still needs considerable assist for sit to stand transfers, and I remain concerned re: his and his mother's ability to manage at home, and recommend post-acute rehab to maximize independence and safety with mobility and ADLs prior to getting home   Follow Up Recommendations  SNF  If for home, recommend maximizing [facility] services     Equipment Recommendations  Rolling walker with 5" wheels;3in1 (PT) (supplemental O2)    Recommendations for Other Services       Precautions / Restrictions Precautions Precautions: Fall Precaution Comments: monitor O2 Restrictions Weight Bearing Restrictions: No    Mobility  Bed Mobility Overal bed mobility: Needs Assistance Bed Mobility: Supine to Sit     Supine to sit: Mod assist;HOB elevated     General bed mobility comments: Pt is able to walk feet to EOB, assist for trunk elevation (pulls up on therapist) and assist with bed pad to bring hips EOB    Transfers Overall transfer level: Needs assistance Equipment used: Rolling walker (2 wheeled) Transfers: Sit to/from Stand Sit to Stand: Mod assist;+2  safety/equipment         General transfer comment: vc for safe hand placement and to scoot hips fwd to assist with improved biomechanics mod A +2 to come to standing from elevated bed; tendency for initial posterior lean persists  Ambulation/Gait Ambulation/Gait assistance: Min assist;+2 safety/equipment (chair push and O2 management) Gait Distance (Feet): 80 Feet Assistive device: Rolling walker (2 wheeled) Gait Pattern/deviations: Step-to pattern;Decreased stride length;Shuffle;Wide base of support;Trunk flexed Gait velocity: decreased   General Gait Details: Frequent cues for posture and RW proximity.  Pt with wide BOS due to scrotal edema.  Shuffle step with decreased dorsiflexion; O2 sat drop on room air; See other PT note of this date   Stairs             Wheelchair Mobility    Modified Rankin (Stroke Patients Only)       Balance Overall balance assessment: Needs assistance Sitting-balance support: Feet supported Sitting balance-Leahy Scale: Fair     Standing balance support: Bilateral upper extremity supported Standing balance-Leahy Scale: Poor Standing balance comment: Pt requiring UE support and with tendency to posterior lean upon initial stand and forward momentum with gait                            Cognition Arousal/Alertness: Awake/alert Behavior During Therapy: Flat affect Overall Cognitive Status: Impaired/Different from baseline Area of Impairment: Safety/judgement                         Safety/Judgement: Decreased awareness of safety;Decreased awareness of deficits  General Comments: Pt is focused on dc home. Pt will acknoelwdge that it takes 2 strong therapists to assist him into standing, and cannot provide adequate solution for how he will achieve this at home "I will be able to do it with my mom (who is in her 62's)"      Exercises      General Comments General comments (skin integrity, edema, etc.): Scrotal  sling created today for patient with focus on improved mobility, pain management, and edema management      Pertinent Vitals/Pain Pain Assessment: Faces Faces Pain Scale: Hurts little more Pain Location: swollen scrotum Pain Descriptors / Indicators: Discomfort Pain Intervention(s): Repositioned (scrotal sling)    Home Living                      Prior Function            PT Goals (current goals can now be found in the care plan section) Acute Rehab PT Goals Patient Stated Goal: Go home PT Goal Formulation: With patient Time For Goal Achievement: 09/14/20 Potential to Achieve Goals: Good Progress towards PT goals: Progressing toward goals    Frequency    Min 3X/week      PT Plan Current plan remains appropriate    Co-evaluation PT/OT/SLP Co-Evaluation/Treatment: Yes Reason for Co-Treatment: For patient/therapist safety (Assist with scrotal sling) PT goals addressed during session: Mobility/safety with mobility OT goals addressed during session: ADL's and self-care;Proper use of Adaptive equipment and DME      AM-PAC PT "6 Clicks" Mobility   Outcome Measure  Help needed turning from your back to your side while in a flat bed without using bedrails?: A Little Help needed moving from lying on your back to sitting on the side of a flat bed without using bedrails?: A Little Help needed moving to and from a bed to a chair (including a wheelchair)?: A Lot Help needed standing up from a chair using your arms (e.g., wheelchair or bedside chair)?: A Lot Help needed to walk in hospital room?: A Little Help needed climbing 3-5 steps with a railing? : A Lot 6 Click Score: 15    End of Session Equipment Utilized During Treatment: Gait belt;Oxygen (scrotal sling) Activity Tolerance: Patient tolerated treatment well Patient left: in chair;with call bell/phone within reach;with chair alarm set Nurse Communication: Mobility status PT Visit Diagnosis: Unsteadiness on  feet (R26.81);Other abnormalities of gait and mobility (R26.89);Muscle weakness (generalized) (M62.81)     Time: [id number] PT Time Calculation (min) (ACUTE ONLY): 36 min  Charges:  $Gait Training: 8-22 mins                     Roney Marion, [place]  Acute Rehabilitation Services Pager [phone] Office [phone]    Colletta [place] 09/09/2020, 2:07 PM

## 2020-09-09 NOTE — Consult Note (Addendum)
Cardiology Consultation:   Patient ID: Shravan Gautreau MRN: [id number]; DOB: Oct 24, 1960  Admit date: 08/28/2020 Date of Consult: 09/09/2020  PCP:  [name], [place]  Cardiologist:  New to Dr. Ellyn Hack Advanced Practice Provider:  No care team member to display Electrophysiologist:  None   Patient Profile:   Wilkins Mance is a 60 y.o. male with a hx of 60 y.o. male with HTN, HLD, DM II, obesity, recently diagnosed nephrotic syndrome who is being seen today for the evaluation of abnormal echocardiogram at the request of Dr. Sabino Gasser.  History of Present Illness:   Mr. Mccallen denies any prior cardiac history. His mother is at bedside. He was recently seen by nephrology as an outpatient for swelling for several weeks. Workup was underway for nephrotic syndrome at the time, confirmed with 10.9g of proteinuria in his UA. While undergoing OP workup, he presented to the hospital with worsening DOE and swelling in his arms, legs and abdomen. He was found to have pleural effusions, pulmonary edema, and anasarca. LE venous duplex negative. Labs showed AKI felt hemodynamically mediated by reduced renal perfusion and ACE inhibitor. His 2D echo on 08/29/20 (1 day into admission) showed EF 40-45%, grade 2 DD, moderately enlarged RV with moderate RV dysfunction, severely elevated PASP, moderate LAE, mild RAE, trace pericardial effusion, large left pleural effusion, moderate MR, dilated IVC. 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 He has been aggressively diuresed by the nephrology team with improvement in his volume status.  Weight is down 10lb from admission and -9.4L. He also got IV iron for anemia felt due to chronic disease/dilution. Last labs show hyponatremia of 128, creatinine of 2.57 (previous value 1.2-1.7 in 2021), hemoglobin of 7.9.   His ACEi remains on hold with consideration of adding back as OP. Nephrology/IM managing his blood  pressure. We are asked to weigh in on abnormal echo. The patient denies any history of chest pain, palpitations or syncope. He feels his SOB and swelling is improving but not quite back to baseline. He has a reserved affect. He was felt to require SNF but wants to go home.   Past Medical History:  Diagnosis Date  . Arthritis   . Diabetes mellitus ([place])   . GERD (gastroesophageal reflux disease)   . HTN (hypertension)   . Hyperlipidemia   . Nephrotic syndrome   . Obesity     Past Surgical History:  Procedure Laterality Date  . CATARACT EXTRACTION, BILATERAL  2018  . TONSILLECTOMY       Home Medications:  Prior to Admission medications   Medication Sig Start Date End Date Taking? Authorizing Provider  acetaminophen (TYLENOL) 500 MG tablet Take 500 mg by mouth every 6 (six) hours as needed (arthritis).    Yes [provider]  amLODipine (NORVASC) 5 MG tablet TAKE 1 Tablet BY MOUTH ONCE EVERY DAY Patient taking differently: Take 5 mg by mouth daily. 06/16/20  Yes Soyla Dryer, PA-C  atorvastatin (LIPITOR) 10 MG tablet Take 10 mg by mouth daily. 08/17/20  Yes [provider]  benzonatate (TESSALON) 100 MG capsule Take 100 mg by mouth 3 (three) times daily as needed for cough. 08/24/20  Yes [provider]  Calcium Carbonate-Simethicone 750-80 MG CHEW Chew 1 tablet by mouth daily as needed (heartburn).   Yes [provider]  famotidine (PEPCID) 20 MG tablet Take 1 tablet (20 mg total) by mouth 2 (two) times daily as needed for heartburn or indigestion. 01/08/18  Yes Soyla Dryer, PA-C  furosemide (LASIX) 40 MG tablet Take 80 mg by mouth daily. 08/10/20  Yes [provider]  gabapentin (NEURONTIN) 100 MG capsule Take 1 capsule (100 mg total) by mouth 2 (two) times daily. Patient taking differently: Take 100 mg by mouth daily. 06/16/20  Yes Soyla Dryer, PA-C  glipiZIDE (GLUCOTROL) 5 MG tablet Take 5 mg by mouth daily. 08/26/20  Yes [provider]  lisinopril (ZESTRIL) 20 MG tablet TAKE 1 Tablet BY MOUTH ONCE EVERY DAY Patient taking differently: Take 20 mg by mouth at bedtime. 06/16/20  Yes Soyla Dryer, PA-C  loratadine (CLARITIN) 10 MG tablet Take 10 mg by mouth daily. 08/24/20  Yes [provider]  metoprolol tartrate (LOPRESSOR) 50 MG tablet Take 1 tablet (50 mg total) by mouth 2 (two) times daily. 06/16/20  Yes Soyla Dryer, PA-C  Omega-3 Fatty Acids (FISH OIL) 1200 MG CAPS Take 1 capsule (1,200 mg total) by mouth 2 (two) times daily. Patient taking differently: Take 1 capsule by mouth at bedtime. 01/08/18  Yes Soyla Dryer, PA-C  atorvastatin (LIPITOR) 20 MG tablet TAKE 1 Tablet BY MOUTH ONCE EVERY DAY Patient not taking: Reported on 08/28/2020 06/16/20   Soyla Dryer, PA-C    Inpatient Medications: Scheduled Meds: . amLODipine  10 mg Oral Daily  . dextromethorphan-guaiFENesin  1 tablet Oral BID  . gabapentin  100 mg Oral Daily  . heparin  5,000 Units Subcutaneous Q8H  . hydrALAZINE  25 mg Oral Q8H  . insulin aspart  0-9 Units Subcutaneous TID WC  . isosorbide mononitrate  30 mg Oral Daily  . metoprolol tartrate  50 mg Oral BID  . multivitamin  1 tablet Oral QHS  . pantoprazole  40 mg Oral Daily  . sodium chloride flush  3 mL Intravenous Q12H  . torsemide  100 mg Oral Daily   Continuous Infusions: . sodium chloride     PRN Meds: sodium chloride, acetaminophen **OR** acetaminophen, alum & mag hydroxide-simeth, chlorpheniramine-HYDROcodone, polyethylene glycol, sodium chloride, sodium chloride flush, traZODone  Allergies:   No Known Allergies  Social History:   Social History   Socioeconomic History  . Marital status: Single    Spouse name: Not on file  . Number of children: Not on file  . Years of education: Not on file  . Highest education level: Not on file  Occupational History  . Not on file  Tobacco Use  . Smoking status: Never Smoker  . Smokeless tobacco: Never Used   Vaping Use  . Vaping Use: Never used  Substance and Sexual Activity  . Alcohol use: Never  . Drug use: Never  . Sexual activity: Not on file  Other Topics Concern  . Not on file  Social History Narrative  . Not on file   Social Determinants of Health   Financial Resource Strain: Not on file  Food Insecurity: Not on file  Transportation Needs: Not on file  Physical Activity: Not on file  Stress: Not on file  Social Connections: Not on file  Intimate Partner Violence: Not on file    Family History:    Family History  Problem Relation Age of Onset  . Hypertension Mother   . Diabetes Mother   . Cancer Father        prostate cancer  . Hypertension Maternal Grandfather   . Rheum arthritis Maternal Grandmother   . CAD Neg Hx      ROS:  Please see the history of present illness.  All  other ROS reviewed and negative.     Physical Exam/Data:   Vitals:   09/08/20 1900 09/08/20 2130 09/09/20 0500 09/09/20 1001  BP: (!) 143/49 131/67 138/75 (!) 163/87  Pulse: (!) 51 (!) 49 69 63  Resp: '18 18 18 17  '$ Temp: 97.7 F (36.5 C) 98.4 F (36.9 C) 98.5 F (36.9 C)   TempSrc: Oral  Oral   SpO2: 99% 97% 98% 94%  Weight:        Intake/Output Summary (Last 24 hours) at 09/09/2020 1507 Last data filed at 09/09/2020 [id number] Gross per 24 hour  Intake 340 ml  Output 2075 ml  Net -1735 ml   Last 3 Weights 09/07/2020 09/07/2020 09/06/2020  Weight (lbs) 187 lb 2.8 oz 187 lb 2.7 oz 187 lb 13.3 oz  Weight (kg) 84.903 kg 84.9 kg 85.2 kg     Body mass index is 28.67 kg/m.  General: Well developed WM, in no acute distress. Appears older than stated age Head: Normocephalic, atraumatic, sclera non-icteric, no xanthomas, nares are without discharge. Neck: Negative for carotid bruits. JVP elevated. Lungs: Diminished at bases bilaterally to auscultation otherwise without wheezes, rales, or rhonchi. Breathing is unlabored. Heart: RRR S1 S2 without murmurs, rubs, or gallops.  Abdomen: Soft,  non-tender, non-distended with normoactive bowel sounds. No rebound/guarding. Extremities: No clubbing or cyanosis. 1+ pitting BLE edema with wrapped lower extremities Neuro: Alert and oriented X 3. Moves all extremities spontaneously. Psych:  Responds to questions appropriately with somewhat flat affect affect.   EKG:  The EKG was personally reviewed and demonstrates:  SB 58bpm, one PVC, nonspecific TW changes  Telemetry:  Telemetry was personally reviewed and demonstrates:  N/A, not on telemetry  Relevant CV Studies: 2D echo 08/29/20  1. Left ventricular ejection fraction, by estimation, is 40 to 45%. The  left ventricle has mildly decreased function. The left ventricle  demonstrates global hypokinesis. Left ventricular diastolic parameters are  consistent with Grade II diastolic  dysfunction (pseudonormalization). Elevated left ventricular end-diastolic  pressure.  2. Right ventricular systolic function is moderately reduced. The right  ventricular size is moderately enlarged. There is severely elevated  pulmonary artery systolic pressure. The estimated right ventricular  systolic pressure is 99991111 mmHg.  3. Left atrial size was moderately dilated.  4. Right atrial size was mildly dilated.  5. The pericardial effusion is circumferential. Large pleural effusion in  the left lateral region.  6. The mitral valve is normal in structure. Moderate mitral valve  regurgitation. No evidence of mitral stenosis.  7. Tricuspid valve regurgitation is moderate.  8. The aortic valve is normal in structure. Aortic valve regurgitation is  not visualized. Mild to moderate aortic valve sclerosis/calcification is  present, without any evidence of aortic stenosis.  9. The inferior vena cava is dilated in size with <50% respiratory  variability, suggesting right atrial pressure of 15 mmHg.   Laboratory Data:  High Sensitivity Troponin:   Recent Labs  Lab 08/28/20 1030 08/28/20 1223   TROPONINIHS 33* 33*     Chemistry Recent Labs  Lab 09/07/20 0327 09/08/20 0327 09/09/20 0848  NA 132* 131* 128*  K 3.7 3.8 3.6  CL 97* 96* 93*  CO2 '29 30 28  '$ GLUCOSE 133* 104* 141*  BUN 41* 44* 43*  CREATININE 2.46* 2.59* 2.57*  CALCIUM 8.0* 8.1* 8.1*  GFRNONAA 29* 27* 28*  ANIONGAP '6 5 7    '$ Recent Labs  Lab 09/07/20 0327 09/08/20 0327 09/09/20 0848  ALBUMIN 2.2* 2.2* 2.3*   Hematology  Recent Labs  Lab 09/07/20 0327 09/08/20 0327 09/09/20 0848  WBC 5.4 5.0 4.9  RBC 2.42* 2.54* 2.71*  HGB 7.2* 7.5* 7.9*  HCT 21.3* 22.3* 24.1*  MCV 88.0 87.8 88.9  MCH 29.8 29.5 29.2  MCHC 33.8 33.6 32.8  RDW 13.0 13.1 13.0  PLT 195 204 242   BNPNo results for input(s): BNP, PROBNP in the last 168 hours.  DDimer No results for input(s): DDIMER in the last 168 hours.   Radiology/Studies:  DG CHEST PORT 1 VIEW  Result Date: 09/07/2020 CLINICAL DATA:  Pleural effusion EXAM: PORTABLE CHEST 1 VIEW COMPARISON:  09/02/2020 chest radiograph. FINDINGS: Stable cardiomediastinal silhouette with mild cardiomegaly. No pneumothorax. Small right and small to moderate left pleural effusions, similar. Mild pulmonary edema, similar. Bibasilar atelectasis, similar. IMPRESSION: Stable mild congestive heart failure with small right and small to moderate left pleural effusions and bibasilar atelectasis. Electronically Signed   By: Ilona Sorrel M.D.   On: 09/07/2020 07:20     Assessment and Plan:   1. Nephrotic syndrome with anasarca, also followed for non-oliguric AKI - biopsy with nodular diabetic glomerulosclerosis  - following with nephrology  2. Newly recognized cardiomyopathy (LV + RV) - etiology not clear - no history to suggest prior MI, arrhythmia or related to substance use - cannot exclude a relationship to underlying CAD, hypertensive heart disease, stress-induced cardiomyopathy, or other undefined NICM at thist ime - for now he is on antihypertensive management with amlodipine '10mg'$   daily, hydralazine '25mg'$  TID, Imdur '30mg'$  daily, Lopressor '50mg'$  BID, torsemide - nephrology is driving diuretic management and timing of ACEi initiation - see below for MD thoughts  3. Severe pulmonary HTN - suspect sequelae of severe volume overload, may be useful to see what PASP looks like following this diuresis - will review with MD - consider arranging sleep study as outpatient if PA pressures remain elevated  4. Moderate mitral regurgitation - question related to volume overload, can reassess on follow-up - also trivial pericardial effusion, not felt to be of hemodynamic significance at thist ime  5. Essential HTN - manage in context of the above  6. Other medical issues managed by primary teams - anemia felt due to chronic disease and dilution - hyponatremia - DM  Per d/w MD, tentatively arranged follow-up in our [place] office Wednesday Oct 21, 2020 at 9:45 AM, appt placed on AVS. Will also arrange echo a few days before this (full echo per MD)- will relay on AVS when info available.  Risk Assessment/Risk Scores:       New York Heart Association (NYHA) Functional Class NYHA Class III   For questions or updates, please contact [facility] Please consult [email or web address] for contact info under    Signed, Charlie Pitter, PA-C  09/09/2020 3:07 PM   ATTENDING ATTESTATION  I have seen, examined and evaluated the patient this PM along with Melina Copa, PA-C.  After reviewing all the available data and chart, we discussed the patients laboratory, study & physical findings as well as symptoms in detail. I agree with her findings, examination as well as impression recommendations as per our discussion.    Mr.Mariscal presented with nephrotic syndrome level proteinuria with volume overload and extensive anasarca.  He had an echocardiogram done about a week and a half ago showing reduced pump function with global hypokinesis and EF 40 to 45%.  I agree with the assessment that this could be  from any potential etiology, however cannot exclude it being simply related to  volume overload.  At this point, our treatment options are relatively limited to diuresis per nephrology and blood pressure control per nephrology.  Clearly an ACE inhibitor or ARB would be beneficial from a cardiac standpoint, but he is not ready for that from a nephrology standpoint.   With issues of renal insufficiency, we certainly cannot proceed to much of an invasive evaluation his cardiac catheterization would be out of the picture for this point.  He is not having any active angina symptoms nor is he having any significant orthopnea PND that now that he has been diuresed.  Ultimately he would potentially benefit from right and left heart cath if his renal function will stabilize, however that not likely can happen in the near future.  The plan for now should be to continue current therapy for blood pressure control and volume reduction.  Recheck an echocardiogram after 3 to 4 weeks and then have him seen in the outpatient follow-up after that echocardiogram to see if any additional evaluation is needed.  We must take in consideration that any contrast induction would potentially worsen his ongoing renal insufficiency.     Glenetta Hew, M.D., M.S. Interventional Cardiologist   Pager # [phone] Phone # [phone] [address]. [place], [place] [address]

## 2020-09-10 DIAGNOSIS — I5043 Acute on chronic combined systolic (congestive) and diastolic (congestive) heart failure: Secondary | ICD-10-CM | POA: Diagnosis not present

## 2020-09-10 DIAGNOSIS — R601 Generalized edema: Secondary | ICD-10-CM | POA: Diagnosis not present

## 2020-09-10 DIAGNOSIS — J9601 Acute respiratory failure with hypoxia: Secondary | ICD-10-CM | POA: Diagnosis not present

## 2020-09-10 DIAGNOSIS — D638 Anemia in other chronic diseases classified elsewhere: Secondary | ICD-10-CM | POA: Diagnosis not present

## 2020-09-10 LAB — RENAL FUNCTION PANEL
Albumin: 2.4 g/dL — ABNORMAL LOW (ref 3.5–5.0)
Anion gap: 7 (ref 5–15)
BUN: 43 mg/dL — ABNORMAL HIGH (ref 8–23)
CO2: 30 mmol/L (ref 22–32)
Calcium: 8.4 mg/dL — ABNORMAL LOW (ref 8.9–10.3)
Chloride: 92 mmol/L — ABNORMAL LOW (ref 98–111)
Creatinine, Ser: 2.63 mg/dL — ABNORMAL HIGH (ref 0.61–1.24)
GFR, Estimated: 27 mL/min — ABNORMAL LOW (ref >60)
Glucose, Bld: 101 mg/dL — ABNORMAL HIGH (ref 70–99)
Phosphorus: 4.8 mg/dL — ABNORMAL HIGH (ref 2.5–4.6)
Potassium: 3.7 mmol/L (ref 3.5–5.1)
Sodium: 129 mmol/L — ABNORMAL LOW (ref 135–145)

## 2020-09-10 LAB — CBC
HCT: 24.5 % — ABNORMAL LOW (ref 39.0–52.0)
Hemoglobin: 8.2 g/dL — ABNORMAL LOW (ref 13.0–17.0)
MCH: 29.4 pg (ref 26.0–34.0)
MCHC: 33.5 g/dL (ref 30.0–36.0)
MCV: 87.8 fL (ref 80.0–100.0)
Platelets: 243 10*3/uL (ref 150–400)
RBC: 2.79 MIL/uL — ABNORMAL LOW (ref 4.22–5.81)
RDW: 12.8 % (ref 11.5–15.5)
WBC: 5.2 10*3/uL (ref 4.0–10.5)
nRBC: 0 % (ref 0.0–0.2)

## 2020-09-10 LAB — GLUCOSE, CAPILLARY
Glucose-Capillary: 115 mg/dL — ABNORMAL HIGH (ref 70–99)
Glucose-Capillary: 105 mg/dL — ABNORMAL HIGH (ref 70–99)
Glucose-Capillary: 99 mg/dL (ref 70–99)
Glucose-Capillary: 99 mg/dL (ref 70–99)
Glucose-Capillary: 117 mg/dL — ABNORMAL HIGH (ref 70–99)

## 2020-09-10 LAB — MAGNESIUM: Magnesium: 2 mg/dL (ref 1.7–2.4)

## 2020-09-10 MED ORDER — AMLODIPINE BESYLATE 10 MG PO TABS: 10.0000 mg | ORAL_TABLET | Freq: Every day | ORAL | 3 refills | Status: DC

## 2020-09-10 MED ORDER — POTASSIUM CHLORIDE CRYS ER 20 MEQ PO TBCR
20.0000 meq | EXTENDED_RELEASE_TABLET | ORAL | 3 refills | Status: DC
Start: 2020-09-10 — End: 2020-11-27

## 2020-09-10 MED ORDER — HYDRALAZINE HCL 25 MG PO TABS: 25.0000 mg | ORAL_TABLET | Freq: Three times a day (TID) | ORAL | 3 refills | Status: DC

## 2020-09-10 MED ORDER — ISOSORBIDE MONONITRATE ER 30 MG PO TB24: 30.0000 mg | ORAL_TABLET | Freq: Every day | ORAL | 3 refills | Status: DC

## 2020-09-10 MED ORDER — TORSEMIDE 100 MG PO TABS: 100.0000 mg | ORAL_TABLET | Freq: Every day | ORAL | 3 refills | Status: DC

## 2020-09-10 MED ORDER — METOLAZONE 2.5 MG PO TABS: 2.5000 mg | ORAL_TABLET | ORAL | 3 refills | Status: DC

## 2020-09-10 MED ORDER — GABAPENTIN 100 MG PO CAPS: 100.0000 mg | ORAL_CAPSULE | Freq: Every day | ORAL | Status: DC

## 2020-09-10 NOTE — Progress Notes (Signed)
   Per Nephrology - OK to d/c with med recommendations.   We have arranged OP Echo & f/u to [name].    [facility] will sign off.   Medication Recommendations:  Per Nephrology Other recommendations (labs, testing, etc):  We have arranged OP Echo in ~ 1 month Follow up as an outpatient:  Arranged.    Glenetta Hew, MD

## 2020-09-10 NOTE — Progress Notes (Signed)
Physical Therapy Treatment Patient Details Name: [name] [name] MRN: [id number] DOB: 1960/11/19 Today's Date: 09/10/2020    History of Present Illness Pt is 60 year old gentleman admitted on 08/28/20.  He has been recently followed up for nephrotic syndrome with gross proteinuria as outpatient admitted with worsening lower extremity swelling and anasarca with fluid overload and AKI.  Sent from nephrology office. S/p L kidney biposy 3/25.  Pt with history of hypertension, hyperlipidemia, type 2 diabetes    PT Comments    Pt seen for session with focus on progression of OOB mobility, independence with transfers and endurance. The pt remains dependent on min/modA to complete bed mobility, even with HOB elevated and use of bed rails. The pt also requires modA to power up from EOB due to deficits in power to generate hip lift from bed and strong posterior lean with initial stand. The pt requires minA to manage RW, O2, and stability with short bout of ambulation in his room, educated on need for assist and importance of continued mobility at home. Pt will continue to benefit from max PT to facilitate strength and stability to perform transfers with improved independence, as well as to progress activity tolerance and endurance.    Follow Up Recommendations  SNF (max HHPT if pt remains adamant about d/c home)     Equipment Recommendations  Rolling walker with 5" wheels;3in1 (PT) (home O2)    Recommendations for Other Services       Precautions / Restrictions Precautions Precautions: Fall Precaution Comments: monitor O2 Restrictions Weight Bearing Restrictions: No    Mobility  Bed Mobility Overal bed mobility: Needs Assistance Bed Mobility: Supine to Sit;Sit to Supine   Sidelying to sit: HOB elevated;Mod assist Supine to sit: Min guard;HOB elevated     General bed mobility comments: modA to pull trunk from elevated HOB, pt with good initial efforts to move BLE to EOB with significantly  increased time and effort, pt reequiring multiple attempts to complete, very uncomfortable due to scrotal swelling, but eventually able to come to sitting EOB. posterior lean with initial transition    Transfers Overall transfer level: Needs assistance Equipment used: Rolling walker (2 wheeled) Transfers: Sit to/from Stand Sit to Stand: Mod assist         General transfer comment: modA to stand from low bed, posterior lean with initial stand. repeated VC for hand placement. Pt then with minG to stand from [facility] with VS for hand placement and technique. very poor eccentric lower  Ambulation/Gait Ambulation/Gait assistance: Min assist Gait Distance (Feet): 10 Feet Assistive device: Rolling walker (2 wheeled) Gait Pattern/deviations: Step-to pattern;Decreased stride length;Shuffle;Wide base of support;Trunk flexed Gait velocity: decreased Gait velocity interpretation: <1.31 ft/sec, indicative of household ambulator General Gait Details: Frequent cues for posture and RW proximity.  Pt with wide BOS due to scrotal edema.  Shuffle step with decreased dorsiflexion; O2 maintained on 3L O2      Balance Overall balance assessment: Needs assistance Sitting-balance support: Bilateral upper extremity supported;Feet unsupported Sitting balance-Leahy Scale: Poor   Postural control: Posterior lean Standing balance support: Bilateral upper extremity supported Standing balance-Leahy Scale: Poor Standing balance comment: Pt requiring UE support and with tendency to posterior lean upon initial stand and forward momentum with gait                            Cognition Arousal/Alertness: Awake/alert Behavior During Therapy: Flat affect;Agitated Overall Cognitive Status: Impaired/Different from baseline Area of Impairment: Safety/judgement  Safety/Judgement: Decreased awareness of safety;Decreased awareness of deficits     General Comments: pt focused  on d/c home, states his mother provided assist prior to admission, and therefore will be able to provide needed assist after d/c. Pt with decreased insight to need for assist, level of assist needed, and requiring repeated cues for technique/hand placement      Exercises      General Comments General comments (skin integrity, edema, etc.): Pt received on 3LO2, VSS on 3L. Pt educated on need for assist and pt stating all assist can be provided by mother. session cut short by arrival of O2 rep to educate pt on home O2      Pertinent Vitals/Pain Pain Assessment: Faces Faces Pain Scale: Hurts little more Pain Location: swollen scrotum and neck Pain Descriptors / Indicators: Discomfort Pain Intervention(s): Limited activity within patient's tolerance;Monitored during session;Repositioned           PT Goals (current goals can now be found in the care plan section) Acute Rehab PT Goals Patient Stated Goal: Go home PT Goal Formulation: With patient Time For Goal Achievement: 09/14/20 Potential to Achieve Goals: Good Progress towards PT goals: Progressing toward goals    Frequency    Min 3X/week      PT Plan Current plan remains appropriate       AM-PAC PT "6 Clicks" Mobility   Outcome Measure  Help needed turning from your back to your side while in a flat bed without using bedrails?: A Little Help needed moving from lying on your back to sitting on the side of a flat bed without using bedrails?: A Little Help needed moving to and from a bed to a chair (including a wheelchair)?: A Lot Help needed standing up from a chair using your arms (e.g., wheelchair or bedside chair)?: A Lot Help needed to walk in hospital room?: A Little Help needed climbing 3-5 steps with a railing? : A Lot 6 Click Score: 15    End of Session Equipment Utilized During Treatment: Gait belt;Oxygen Activity Tolerance: Patient tolerated treatment well Patient left: in bed;with call bell/phone within  reach (O2 rep educating on home O2) Nurse Communication: Mobility status PT Visit Diagnosis: Unsteadiness on feet (R26.81);Other abnormalities of gait and mobility (R26.89);Muscle weakness (generalized) (M62.81)     Time: [id number] PT Time Calculation (min) (ACUTE ONLY): 22 min  Charges:  $Therapeutic Activity: 8-22 mins                     Karma Ganja, PT, DPT   Acute Rehabilitation Department Pager #: [phone]   Otho Bellows 09/10/2020, 12:06 PM

## 2020-09-10 NOTE — Plan of Care (Signed)
  Problem: Activity: Goal: Risk for activity intolerance will decrease Outcome: Progressing   Problem: Elimination: Goal: Will not experience complications related to urinary retention Outcome: Progressing   

## 2020-09-10 NOTE — Progress Notes (Signed)
DISCHARGE NOTE HOME [name] [name] to be discharged Home per MD order. Discussed prescriptions and follow up appointments with the patient. Prescriptions given to patient; medication list explained in detail. Patient verbalized understanding.  Skin clean, dry and intact without evidence of skin break down, no evidence of skin tears noted. IV catheter discontinued intact. Site without signs and symptoms of complications. Dressing and pressure applied. Pt denies pain at the site currently. No complaints noted.  Patient free of lines, drains, and wounds.   An After Visit Summary (AVS) was printed and given to the patient. Patient escorted via wheelchair, and discharged home via private auto.  [name], RN

## 2020-09-10 NOTE — Progress Notes (Signed)
[facility] NEPHROLOGY PROGRESS NOTE  Assessment/ Plan: Pt is a 60 y.o. yo male  With HTN, HLD, DM, recently seen at [facility] for nephrotic syndrome presents with worsening lower extremity edema/anasarca, seen as a consultation for the management of fluid overload and AKI.  #Nephrotic syndrome with anasarca:  UA with 10.9 g of proteinuria.  He likely has diabetic nephropathy but there is a concern for podocytopathy.  However with microscopic hematuria and AKI need to rule out underlying glomerular disease. Extensive serology test done as outpatient unremarkable, including ANA, ANCA, C3, C4, hepatitis B, C, HIV, RPR negative.  SPEP with no M spike and kappa lambda ratio was 0.95. Biopsy with nodular diabetic glomerulosclerosis. - Diuresing well - I think a reasonable regimen for home will be torsemide 100 daily with metolazone 2.5 QOD until he has close f/u at [place] next week.   -Ultimately needs to be back on ACEi/ARB but in setting of diuresis and AKI holding for now; potentially could be resumed at outpt visit next week if renal function stable/improved -daily weights ordered, strict I/O -reiterated low na diet - was eating [employer] yesterday  #Acute kidney injury, nonoliguric: Probably hemodynamically mediated with reduced renal perfusion and ACE inhibitor.  Agree with holding lisinopril.  Diuretics to offload volume and to help renal perfusion. Avoid nephrotoxins or hypotensive episode. - Cr stable at 2.6 today -ultimately will add back ACEi given DKD but in midst of diuresis will hold for now; hopefully at f/u visit it can be resumed  #HFrEF: mildly reduced, cardiology has consulted; continue diuresis and f/u outpt.    #Anemia probably due to chronic disease and dilution: Iron saturation 12% - has had ferric gluconate '250mg'$ x 2 this admission.  If Hb not trending up with iron will need ESA initiated outpt  #Hypertension:  Acceptably controlled. Continue with norvasc  '10mg'$  daily, lopressor '50mg'$  BID, and diuretics per above.  #type 2 diabetes: h/o poor control, now well controlled. per primary team.  Dispo:  Ok to d/c from my perspective with the above noted diuretic regimen; f/u with be at [place] office next week and he will be notified of date/time - he's aware  Subjective: Seen in room, alone today.  Overall feeling improved - edema improving but not to baseline. Declining SNF recommendation.  I/Os 940/2150L, net neg 10.5L for admission per I/Os.   Objective Vital signs in last 24 hours: Vitals:   09/09/20 0500 09/09/20 1001 09/09/20 1900 09/09/20 1942  BP: 138/75 (!) 163/87 138/85 139/71  Pulse: 69 63 62 (!) 52  Resp: '18 17 17 20  '$ Temp: 98.5 F (36.9 C) 98.5 F (36.9 C) 98.4 F (36.9 C) 98.1 F (36.7 C)  TempSrc: Oral Oral Oral Oral  SpO2: 98% 94% 95% 99%  Weight:       Weight change:   Intake/Output Summary (Last 24 hours) at 09/10/2020 [id number] Last data filed at 09/10/2020 0600 Gross per 24 hour  Intake 700 ml  Output 2150 ml  Net -1450 ml       Labs: Basic Metabolic Panel: Recent Labs  Lab 09/08/20 0327 09/09/20 0848 09/10/20 0436  NA 131* 128* 129*  K 3.8 3.6 3.7  CL 96* 93* 92*  CO2 '30 28 30  '$ GLUCOSE 104* 141* 101*  BUN 44* 43* 43*  CREATININE 2.59* 2.57* 2.63*  CALCIUM 8.1* 8.1* 8.4*  PHOS 4.9* 4.7* 4.8*   Liver Function Tests: Recent Labs  Lab 09/08/20 0327 09/09/20 0848 09/10/20 0436  ALBUMIN 2.2* 2.3*  2.4*   No results for input(s): LIPASE, AMYLASE in the last 168 hours. No results for input(s): AMMONIA in the last 168 hours. CBC: Recent Labs  Lab 09/06/20 1215 09/07/20 0327 09/08/20 0327 09/09/20 0848 09/10/20 0436  WBC 7.3 5.4 5.0 4.9 5.2  HGB 8.7* 7.2* 7.5* 7.9* 8.2*  HCT 26.0* 21.3* 22.3* 24.1* 24.5*  MCV 89.0 88.0 87.8 88.9 87.8  PLT 247 195 204 242 243   Cardiac Enzymes: No results for input(s): CKTOTAL, CKMB, CKMBINDEX, TROPONINI in the last 168 hours. CBG: Recent Labs  Lab 09/08/20 1114  09/08/20 1700 09/08/20 2130 09/09/20 0639 09/09/20 1136  GLUCAP 123* 115* 149* 104* 140*    Iron Studies:  No results for input(s): IRON, TIBC, TRANSFERRIN, FERRITIN in the last 72 hours. Studies/Results: No results found.  Medications: Infusions: . sodium chloride      Scheduled Medications: . amLODipine  10 mg Oral Daily  . dextromethorphan-guaiFENesin  1 tablet Oral BID  . gabapentin  100 mg Oral Daily  . heparin  5,000 Units Subcutaneous Q8H  . hydrALAZINE  25 mg Oral Q8H  . insulin aspart  0-9 Units Subcutaneous TID WC  . isosorbide mononitrate  30 mg Oral Daily  . metoprolol tartrate  50 mg Oral BID  . multivitamin  1 tablet Oral QHS  . pantoprazole  40 mg Oral Daily  . sodium chloride flush  3 mL Intravenous Q12H  . torsemide  100 mg Oral Daily    have reviewed scheduled and prn medications.  Physical Exam: General:NAD, sitting up in chair Heart:RRR, s1s2 nl Lungs: no iwob, unlabored, bl chest expansion Abdomen:soft, Non-tender, non-distended Extremities: 1+ pitting midway to knees - improving Neurology: Alert awake and following commands GU; condom cath with urine in bad  Justin Mend 09/10/2020,8:12 AM  LOS: 13 days

## 2020-09-10 NOTE — Care Management Important Message (Signed)
Important Message  Patient Details  Name: [name] [name] MRN: [id number] Date of Birth: 07/23/1960   Medicare Important Message Given:  Yes     [name] P [place] 09/10/2020, 11:51 AM

## 2020-09-10 NOTE — TOC Transition Note (Signed)
Transition of Care [facility]) - CM/SW Discharge Note   Patient Details  Name: [name] [name] MRN: [id number] Date of Birth: March 28, 1961  [facility]) CM/SW Contact:  Bartholomew Crews, RN Phone Number: [phone] 09/10/2020, 12:09 PM   Clinical Narrative:     Patient ready to transition home. Cory at [facility] notified. Alvis Lemmings able to accept for additional disciplines. Requested HH/F2F orders for RN, PT, OT, SW. Patient needing home oxygen - referral to [facility]. Spoke with patient at the bedside. Stated his mom will provide transportation home. No further TOC needs identified.   Final next level of care: [place] Barriers to Discharge: No Barriers Identified   Patient Goals and CMS Choice Patient states their goals for this hospitalization and ongoing recovery are:: return home CMS Medicare.gov Compare Post Acute Care list provided to:: Patient Choice offered to / list presented to : Patient  Discharge Placement                       Discharge Plan and Services   Discharge Planning Services: CM Consult Post Acute Care Choice: Home Health          DME Arranged: Oxygen DME Agency: [facility] Date DME Agency Contacted: 09/10/20 Time DME Agency Contacted: 1000 Representative spoke with at DME Agency: Thedore Mins HH Arranged: RN,PT,OT,Social Work [employer] Agency: [place] Date [place]: 09/10/20 Time [place]: 1000 Representative spoke with at [place]: [place] ([place]) Interventions     Readmission Risk Interventions No flowsheet data found.

## 2020-09-11 NOTE — Discharge Summary (Signed)
Physician Discharge Summary   [name] [name] [id number] DOB: [birth year] DOA: 08/28/2020  PCP: [name] Pepper, MD  Admit date: 08/28/2020 Discharge date: 09/10/2020   Admitted From: home Disposition:  home Discharging physician: [name] Dee, MD  Recommendations for Outpatient Follow-up:  1. Follow up with nephrology 2. Follow up with cardiology    Patient discharged to home in Discharge Condition: stable Risk of unplanned readmission score: Unplanned Admission- Pilot do not use: 24.79  CODE STATUS: Full Diet recommendation:  Diet Orders (From admission, onward)    Start     Ordered   09/10/20 0000  Diet - low sodium heart healthy        09/10/20 1116          Hospital Course:  [name] is a 60 year old male with PMH hypertension, hyperlipidemia, DM II who presented to the hospital with worsening renal function and generalized swelling.  He has undergone extensive work-up from nephrology regarding etiologies.  Renal biopsy was also obtained.  Results revealed nodular diabetic glomerulosclerosis.  He was started on diuretic regimen as well.  Nodular diabetic glomerulosclerosis AKI nonoliguric UA with 10.9 g of proteinuria. -Renal biopsy has returned noting nodular diabetic glomerulosclerosis per nephrology -Extensive work-up also completed to rule out other etiologies -Per nephrology, may benefit from SGLT 2 inhibitor in the outpatient setting -Continue torsemide; metolazone also added 3/29 per nephrology -Continue holding lisinopril; goal is to eventually add back  HFrEF, EF 40-45% Gr II DD Elevated RVSP (86 mmHg) on echo Pericardial effusion  Pleural effusion - given anasarca, concern for possible contribution from CHF in addition to diabetic kidney disease vs component of CRS. Also cannot rule out ICM (unable to undergo cath due to renal fxn); plan is for further outpatient follow up with cardiology - continued on diuresis at discharge  Anemia of chronic kidney  disease Iron deficiency Received IV iron  Essential hypertension -Continue hydralazine, Imdur, Lopressor -Continue torsemide and metolazone  Hypokalemia Repleted as needed.  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  Acute hypoxic respiratory failure - continued on O2 at discharge; may be able to wean further after ongoing diuresis   Obesity Placing the patient at high risk for poor outcome in the setting of cardiac and renal dysfunction. Body mass index is 28.67 kg/m.  Cough - resolved  Appears dry in nature. Resolved prior to d/c   Nosebleed. Resolved. Possible due to O2 use    The patient's chronic medical conditions were treated accordingly per the patient's home medication regimen except as noted.  On day of discharge, patient was felt deemed stable for discharge. Patient/family member advised to call PCP or come back to ER if needed.   Principal Diagnosis: Anasarca  Discharge Diagnoses: Active Hospital Problems   Diagnosis Date Noted  . Anasarca 08/28/2020    Priority: High  . Acute on chronic combined systolic and diastolic CHF (congestive heart failure) ([place]) 09/09/2020    Priority: High  . Nodular type diabetic glomerulosclerosis ([place]) 09/09/2020    Priority: High  . Anemia of chronic disease 09/09/2020  . Acute respiratory failure with hypoxia ([place]) 09/09/2020  . Nephrotic syndrome 08/28/2020  . Neuropathy 07/12/2018  . Essential hypertension 02/14/2018  . Uncontrolled type 2 diabetes mellitus with hyperglycemia ([place]) 01/08/2018  . Hyperlipidemia 01/08/2018    Resolved Hospital Problems  No resolved problems to display.    Discharge Instructions    Diet - low sodium heart healthy   Complete  by: As directed    Increase activity slowly   Complete by: As directed    No wound care   Complete by: As directed      Allergies as of 09/10/2020   No Known  Allergies     Medication List    STOP taking these medications   furosemide 40 MG tablet Commonly known as: LASIX   lisinopril 20 MG tablet Commonly known as: ZESTRIL     TAKE these medications   acetaminophen 500 MG tablet Commonly known as: TYLENOL Take 500 mg by mouth every 6 (six) hours as needed (arthritis).   amLODipine 10 MG tablet Commonly known as: NORVASC Take 1 tablet (10 mg total) by mouth daily. What changed:   medication strength  how much to take  how to take this  when to take this  additional instructions   atorvastatin 20 MG tablet Commonly known as: LIPITOR TAKE 1 Tablet BY MOUTH ONCE EVERY DAY What changed: Another medication with the same name was removed. Continue taking this medication, and follow the directions you see here.   benzonatate 100 MG capsule Commonly known as: TESSALON Take 100 mg by mouth 3 (three) times daily as needed for cough.   Calcium Carbonate-Simethicone 750-80 MG Chew Chew 1 tablet by mouth daily as needed (heartburn).   famotidine 20 MG tablet Commonly known as: Pepcid Take 1 tablet (20 mg total) by mouth 2 (two) times daily as needed for heartburn or indigestion.   Fish Oil 1200 MG Caps Take 1 capsule (1,200 mg total) by mouth 2 (two) times daily. What changed: when to take this   gabapentin 100 MG capsule Commonly known as: NEURONTIN Take 1 capsule (100 mg total) by mouth daily.   glipiZIDE 5 MG tablet Commonly known as: GLUCOTROL Take 5 mg by mouth daily.   hydrALAZINE 25 MG tablet Commonly known as: APRESOLINE Take 1 tablet (25 mg total) by mouth every 8 (eight) hours.   isosorbide mononitrate 30 MG 24 hr tablet Commonly known as: IMDUR Take 1 tablet (30 mg total) by mouth daily.   loratadine 10 MG tablet Commonly known as: CLARITIN Take 10 mg by mouth daily.   metolazone 2.5 MG tablet Commonly known as: ZAROXOLYN Take 1 tablet (2.5 mg total) by mouth every other day.   metoprolol tartrate  50 MG tablet Commonly known as: LOPRESSOR Take 1 tablet (50 mg total) by mouth 2 (two) times daily.   potassium chloride SA 20 MEQ tablet Commonly known as: KLOR-CON Take 1 tablet (20 mEq total) by mouth every other day. Take on same days with metolazone   torsemide 100 MG tablet Commonly known as: DEMADEX Take 1 tablet (100 mg total) by mouth daily.       Follow-up Information    [facility] Follow up.   Specialty: Home Health Services Contact information: [address] [place] [address] [phone]        Deberah Pelton, NP Follow up.   Specialty: Cardiology Why: [facility] (Cardiology) - [place] LOC[facility] - a follow-up has been arranged for you on Wednesday Oct 21, 2020 at 9:45 AM (Arrive by 9:30 AM). Denyse Amass is one of the nurse practitioners that works with our cardiology team. Contact information: [address] [place] [address] [phone]        [facility] Follow up.   Specialty: Cardiology Why: [facility] (Cardiology) - [place] location (different location than where office visit  will take place - a heart ultrasound is scheduled for you on Tuesday Oct 13, 2020 at 8:20 AM (Arrive by 8:05 AM).  Contact information: [address] [place] [phone]             No Known Allergies  Consultations: Cardiology Nephrology   Discharge Exam: BP (!) 153/66 (BP Location: Right Arm)   Pulse 63   Temp 98.3 F (36.8 C) (Oral)   Resp 18   Wt 84.9 kg   SpO2 95%   BMI 28.67 kg/m  General appearance: alert, cooperative and no distress Head: Normocephalic, without obvious abnormality, atraumatic Eyes: EOMI Lungs: coarse breath sounds bilaterally Heart: regular rate and rhythm and S1, S2 normal Abdomen: normal findings: bowel sounds normal and soft, non-tender Extremities: edema noted throughout Skin: mobility and turgor  normal Neurologic: Grossly normal  The results of significant diagnostics from this hospitalization (including imaging, microbiology, ancillary and laboratory) are listed below for reference.   Microbiology: No results found for this or any previous visit (from the past 240 hour(s)).   Labs: BNP (last 3 results) Recent Labs    08/28/20 1242  BNP 0000000*   Basic Metabolic Panel: Recent Labs  Lab 09/06/20 0443 09/07/20 0327 09/08/20 0327 09/09/20 0848 09/10/20 0436  NA 134* 132* 131* 128* 129*  K 3.8 3.7 3.8 3.6 3.7  CL 98 97* 96* 93* 92*  CO2 '29 29 30 28 30  '$ GLUCOSE 103* 133* 104* 141* 101*  BUN 41* 41* 44* 43* 43*  CREATININE 2.51* 2.46* 2.59* 2.57* 2.63*  CALCIUM 8.1* 8.0* 8.1* 8.1* 8.4*  MG 2.0 2.0 2.0 2.0 2.0  PHOS 4.4 4.6 4.9* 4.7* 4.8*   Liver Function Tests: Recent Labs  Lab 09/06/20 0443 09/07/20 0327 09/08/20 0327 09/09/20 0848 09/10/20 0436  ALBUMIN 2.4* 2.2* 2.2* 2.3* 2.4*   No results for input(s): LIPASE, AMYLASE in the last 168 hours. No results for input(s): AMMONIA in the last 168 hours. CBC: Recent Labs  Lab 09/06/20 1215 09/07/20 0327 09/08/20 0327 09/09/20 0848 09/10/20 0436  WBC 7.3 5.4 5.0 4.9 5.2  HGB 8.7* 7.2* 7.5* 7.9* 8.2*  HCT 26.0* 21.3* 22.3* 24.1* 24.5*  MCV 89.0 88.0 87.8 88.9 87.8  PLT 247 195 204 242 243   Cardiac Enzymes: No results for input(s): CKTOTAL, CKMB, CKMBINDEX, TROPONINI in the last 168 hours. BNP: Invalid input(s): POCBNP CBG: Recent Labs  Lab 09/09/20 1642 09/09/20 2133 09/10/20 0556 09/10/20 0826 09/10/20 1132  GLUCAP 115* 105* 99 99 117*   D-Dimer No results for input(s): DDIMER in the last 72 hours. Hgb A1c No results for input(s): HGBA1C in the last 72 hours. Lipid Profile No results for input(s): CHOL, HDL, LDLCALC, TRIG, CHOLHDL, LDLDIRECT in the last 72 hours. Thyroid function studies No results for input(s): TSH, T4TOTAL, T3FREE, THYROIDAB in the last 72 hours.  Invalid input(s):  FREET3 Anemia work up No results for input(s): VITAMINB12, FOLATE, FERRITIN, TIBC, IRON, RETICCTPCT in the last 72 hours. Urinalysis    Component Value Date/Time   COLORURINE YELLOW 08/28/2020 1953   APPEARANCEUR HAZY (A) 08/28/2020 1953   LABSPEC 1.014 08/28/2020 1953   PHURINE 5.0 08/28/2020 1953   GLUCOSEU 50 (A) 08/28/2020 1953   HGBUR MODERATE (A) 08/28/2020 1953   BILIRUBINUR NEGATIVE 08/28/2020 1953   KETONESUR NEGATIVE 08/28/2020 1953   PROTEINUR >=300 (A) 08/28/2020 1953   NITRITE NEGATIVE 08/28/2020 1953   LEUKOCYTESUR NEGATIVE 08/28/2020 1953   Sepsis Labs Invalid input(s): PROCALCITONIN,  WBC,  LACTICIDVEN Microbiology No results found for this or any previous visit (from the past 240 hour(s)).  Procedures/Studies: DG Chest 2 View  Result Date: 09/02/2020 CLINICAL DATA:  Elevated blood pressure EXAM: CHEST - 2 VIEW COMPARISON:  08/28/2020 FINDINGS: Enlargement of cardiac silhouette with pulmonary vascular congestion. Mediastinal contours normal. BILATERAL pulmonary infiltrates likely representing pulmonary edema. BILATERAL pleural effusions and bibasilar atelectasis. No pneumothorax or acute osseous findings. IMPRESSION: Enlargement of cardiac silhouette with pulmonary vascular congestion and diffuse pulmonary edema, increased from prior exam. Associated bibasilar pleural effusions and atelectasis, increased from prior exam greater on RIGHT. Electronically Signed   By: Lavonia Dana M.D.   On: 09/02/2020 15:39   DG Chest 2 View  Result Date: 08/28/2020 CLINICAL DATA:  Edema and shortness of breath. EXAM: CHEST - 2 VIEW COMPARISON:  None. FINDINGS: Cardiac enlargement. Small left greater than right pleural effusions. Left greater than right basilar interstitial and airspace opacities. Degenerative change of the shoulders and thoracic spine. IMPRESSION: Cardiac enlargement with small left greater than right pleural effusions and bibasilar interstitial and airspace opacities,  likely edema. Electronically Signed   By: Dahlia Bailiff MD   On: 08/28/2020 11:14   US Venous Img Lower Bilateral (DVT)  Result Date: 08/21/2020 CLINICAL DATA:  Nephrotic syndrome, bilateral peripheral edema EXAM: BILATERAL LOWER EXTREMITY VENOUS DOPPLER ULTRASOUND TECHNIQUE: Gray-scale sonography with graded compression, as well as color Doppler and duplex ultrasound were performed to evaluate the lower extremity deep venous systems from the level of the common femoral vein and including the common femoral, femoral, profunda femoral, popliteal and calf veins including the posterior tibial, peroneal and gastrocnemius veins when visible. The superficial great saphenous vein was also interrogated. Spectral Doppler was utilized to evaluate flow at rest and with distal augmentation maneuvers in the common femoral, femoral and popliteal veins. COMPARISON:  None. FINDINGS: RIGHT LOWER EXTREMITY Common Femoral Vein: No evidence of thrombus. Normal compressibility, respiratory phasicity and response to augmentation. Saphenofemoral Junction: No evidence of thrombus. Normal compressibility and flow on color Doppler imaging. Profunda Femoral Vein: No evidence of thrombus. Normal compressibility and flow on color Doppler imaging. Femoral Vein: No evidence of thrombus. Normal compressibility, respiratory phasicity and response to augmentation. Popliteal Vein: No evidence of thrombus. Normal compressibility, respiratory phasicity and response to augmentation. Calf Veins: No evidence of thrombus. Normal compressibility and flow on color Doppler imaging. Superficial Great Saphenous Vein: No evidence of thrombus. Normal compressibility. Other Findings: Marked bilateral subcutaneous edema from the proximal thigh to the distal calf LEFT LOWER EXTREMITY Common Femoral Vein: No evidence of thrombus. Normal compressibility, respiratory phasicity and response to augmentation. Saphenofemoral Junction: No evidence of thrombus. Normal  compressibility and flow on color Doppler imaging. Profunda Femoral Vein: No evidence of thrombus. Normal compressibility and flow on color Doppler imaging. Femoral Vein: No evidence of thrombus. Normal compressibility, respiratory phasicity and response to augmentation. Popliteal Vein: No evidence of thrombus. Normal compressibility, respiratory phasicity and response to augmentation. Calf Veins: No evidence of thrombus. Normal compressibility and flow on color Doppler imaging. Superficial Great Saphenous Vein: No evidence of thrombus. Normal compressibility. Other Findings: Symmetric subcutaneous edema from the proximal thigh to the distal calf IMPRESSION: Negative for significant DVT in either extremity. Marked symmetric lower extremity subcutaneous edema Electronically Signed   By: Jerilynn Mages.  Shick M.D.   On: 08/21/2020 14:12   DG CHEST PORT 1 VIEW  Result Date: 09/07/2020 CLINICAL DATA:  Pleural effusion EXAM: PORTABLE CHEST 1 VIEW COMPARISON:  09/02/2020 chest radiograph. FINDINGS:  Stable cardiomediastinal silhouette with mild cardiomegaly. No pneumothorax. Small right and small to moderate left pleural effusions, similar. Mild pulmonary edema, similar. Bibasilar atelectasis, similar. IMPRESSION: Stable mild congestive heart failure with small right and small to moderate left pleural effusions and bibasilar atelectasis. Electronically Signed   By: Ilona Sorrel M.D.   On: 09/07/2020 07:20   ECHOCARDIOGRAM COMPLETE  Result Date: 08/29/2020    ECHOCARDIOGRAM REPORT   Patient Name:   [name] [name] Date of Exam: 08/29/2020 Medical Rec #:  [id number]     Height:       67.8 in Accession #:    [id number]    Weight:       159.5 lb Date of Birth:  03/04/1961     BSA:          1.852 m Patient Age:    70 years      BP:           164/86 mmHg Patient Gender: M             HR:           55 bpm. Exam Location:  Inpatient Procedure: 2D Echo, Cardiac Doppler and Color Doppler Indications:    I51.7 Cardiomegaly  History:         Patient has no prior history of Echocardiogram examinations.                 Signs/Symptoms:Altered Mental Status.  Sonographer:    Merrie Roof Referring Phys: [id number] Horace  1. Left ventricular ejection fraction, by estimation, is 40 to 45%. The left ventricle has mildly decreased function. The left ventricle demonstrates global hypokinesis. Left ventricular diastolic parameters are consistent with Grade II diastolic dysfunction (pseudonormalization). Elevated left ventricular end-diastolic pressure.  2. Right ventricular systolic function is moderately reduced. The right ventricular size is moderately enlarged. There is severely elevated pulmonary artery systolic pressure. The estimated right ventricular systolic pressure is 99991111 mmHg.  3. Left atrial size was moderately dilated.  4. Right atrial size was mildly dilated.  5. The pericardial effusion is circumferential. Large pleural effusion in the left lateral region.  6. The mitral valve is normal in structure. Moderate mitral valve regurgitation. No evidence of mitral stenosis.  7. Tricuspid valve regurgitation is moderate.  8. The aortic valve is normal in structure. Aortic valve regurgitation is not visualized. Mild to moderate aortic valve sclerosis/calcification is present, without any evidence of aortic stenosis.  9. The inferior vena cava is dilated in size with <50% respiratory variability, suggesting right atrial pressure of 15 mmHg. FINDINGS  Left Ventricle: Left ventricular ejection fraction, by estimation, is 40 to 45%. The left ventricle has mildly decreased function. The left ventricle demonstrates global hypokinesis. The left ventricular internal cavity size was normal in size. There is  no left ventricular hypertrophy. Left ventricular diastolic parameters are consistent with Grade II diastolic dysfunction (pseudonormalization). Elevated left ventricular end-diastolic pressure. Right Ventricle: The right ventricular  size is moderately enlarged. No increase in right ventricular wall thickness. Right ventricular systolic function is moderately reduced. There is severely elevated pulmonary artery systolic pressure. The tricuspid regurgitant velocity is 4.22 m/s, and with an assumed right atrial pressure of 15 mmHg, the estimated right ventricular systolic pressure is 99991111 mmHg. Left Atrium: Left atrial size was moderately dilated. Right Atrium: Right atrial size was mildly dilated. Pericardium: Trivial pericardial effusion is present. The pericardial effusion is circumferential. Mitral Valve: The mitral valve is normal in structure. There is  mild thickening of the mitral valve leaflet(s). Moderate mitral valve regurgitation. No evidence of mitral valve stenosis. Tricuspid Valve: The tricuspid valve is normal in structure. Tricuspid valve regurgitation is moderate . No evidence of tricuspid stenosis. Aortic Valve: The aortic valve is normal in structure. Aortic valve regurgitation is not visualized. Mild to moderate aortic valve sclerosis/calcification is present, without any evidence of aortic stenosis. Aortic valve mean gradient measures 4.0 mmHg. Aortic valve peak gradient measures 7.5 mmHg. Aortic valve area, by VTI measures 1.79 cm. Pulmonic Valve: The pulmonic valve was normal in structure. Pulmonic valve regurgitation is not visualized. No evidence of pulmonic stenosis. Aorta: The aortic root is normal in size and structure. Venous: The inferior vena cava is dilated in size with less than 50% respiratory variability, suggesting right atrial pressure of 15 mmHg. IAS/Shunts: No atrial level shunt detected by color flow Doppler. Additional Comments: There is a large pleural effusion in the left lateral region.  LEFT VENTRICLE PLAX 2D LVIDd:         5.00 cm  Diastology LVIDs:         4.20 cm  LV e' medial:    4.24 cm/s LV PW:         0.80 cm  LV E/e' medial:  30.0 LV IVS:        0.70 cm  LV e' lateral:   7.40 cm/s LVOT diam:      1.80 cm  LV E/e' lateral: 17.2 LV SV:         48 LV SV Index:   26 LVOT Area:     2.54 cm                          3D Volume EF:                         3D EF:        47 %                         LV EDV:       211 ml                         LV ESV:       112 ml                         LV SV:        99 ml RIGHT VENTRICLE             IVC RV Basal diam:  3.80 cm     IVC diam: 2.00 cm RV S prime:     11.90 cm/s TAPSE (M-mode): 2.8 cm LEFT ATRIUM              Index       RIGHT ATRIUM           Index LA diam:        4.20 cm  2.27 cm/m  RA Area:     23.60 cm LA Vol (A2C):   142.0 ml 76.69 ml/m RA Volume:   78.30 ml  42.29 ml/m LA Vol (A4C):   86.4 ml  46.66 ml/m LA Biplane Vol: 116.0 ml 62.65 ml/m  AORTIC VALVE AV Area (Vmax):    1.71 cm AV Area (Vmean):   1.56 cm AV Area (VTI):  1.79 cm AV Vmax:           137.00 cm/s AV Vmean:          91.600 cm/s AV VTI:            0.269 m AV Peak Grad:      7.5 mmHg AV Mean Grad:      4.0 mmHg LVOT Vmax:         92.00 cm/s LVOT Vmean:        56.000 cm/s LVOT VTI:          0.189 m LVOT/AV VTI ratio: 0.70  AORTA Ao Root diam: 3.20 cm MITRAL VALVE                TRICUSPID VALVE MV Area (PHT): 4.54 cm     TR Peak grad:   71.2 mmHg MV Decel Time: 167 msec     TR Vmax:        422.00 cm/s MV E velocity: 127.00 cm/s MV A velocity: 105.00 cm/s  SHUNTS MV E/A ratio:  1.21         Systemic VTI:  0.19 m                             Systemic Diam: 1.80 cm Ena Dawley MD Electronically signed by Ena Dawley MD Signature Date/Time: 08/29/2020/3:16:54 PM    Final    US BIOPSY (KIDNEY)  Result Date: 09/04/2020 INDICATION: Findings syndrome, acute kidney injury and fluid overload. Need for renal biopsy. EXAM: ULTRASOUND GUIDED CORE BIOPSY OF left kidney MEDICATIONS: None. ANESTHESIA/SEDATION: Fentanyl 25 mcg IV; Versed 1.0 mg IV Moderate Sedation Time:  21 minutes. The patient was continuously monitored during the procedure by the interventional radiology nurse under my direct  supervision. PROCEDURE: The procedure, risks, benefits, and alternatives were explained to the patient. Questions regarding the procedure were encouraged and answered. The patient understands and consents to the procedure. A time-out was performed prior to initiating the procedure. Both kidneys were imaged by ultrasound in a prone position. The left flank region was prepped with chlorhexidine in a sterile fashion, and a sterile drape was applied covering the operative field. A sterile gown and sterile gloves were used for the procedure. Local anesthesia was provided with 1% Lidocaine. Under ultrasound guidance, a 15 gauge trocar needle was advanced to the posterior margin lower pole cortex of the left kidney. Two separate coaxial 16 gauge core biopsy samples were obtained and submitted in saline. A slurry of Gel-Foam pledgets mixed in sterile saline was then injected via the outer needle as the needle was removed and retracted. COMPLICATIONS: None immediate. FINDINGS: Both kidneys are well visualized with ultrasound with the left kidney slightly better visualized at the time of biopsy. Solid core biopsy samples were obtained. IMPRESSION: Ultrasound-guided core biopsy performed at the level of lower pole cortex of the left kidney. Electronically Signed   By: Aletta Edouard M.D.   On: 09/04/2020 10:33     Time coordinating discharge: Over 30 minutes    [name] Dee, MD  [facility] 09/11/2020, 6:22 PM

## 2020-09-14 LAB — SURGICAL PATHOLOGY

## 2020-09-16 DIAGNOSIS — E1122 Type 2 diabetes mellitus with diabetic chronic kidney disease: Secondary | ICD-10-CM | POA: Diagnosis not present

## 2020-09-16 DIAGNOSIS — I429 Cardiomyopathy, unspecified: Secondary | ICD-10-CM | POA: Diagnosis not present

## 2020-09-16 DIAGNOSIS — N1832 Chronic kidney disease, stage 3b: Secondary | ICD-10-CM | POA: Diagnosis not present

## 2020-09-16 DIAGNOSIS — N049 Nephrotic syndrome with unspecified morphologic changes: Secondary | ICD-10-CM | POA: Diagnosis not present

## 2020-09-16 DIAGNOSIS — L03116 Cellulitis of left lower limb: Secondary | ICD-10-CM | POA: Diagnosis not present

## 2020-09-16 DIAGNOSIS — D631 Anemia in chronic kidney disease: Secondary | ICD-10-CM | POA: Diagnosis not present

## 2020-09-16 DIAGNOSIS — N179 Acute kidney failure, unspecified: Secondary | ICD-10-CM | POA: Diagnosis not present

## 2020-09-16 DIAGNOSIS — I129 Hypertensive chronic kidney disease with stage 1 through stage 4 chronic kidney disease, or unspecified chronic kidney disease: Secondary | ICD-10-CM | POA: Diagnosis not present

## 2020-09-16 DIAGNOSIS — N189 Chronic kidney disease, unspecified: Secondary | ICD-10-CM | POA: Diagnosis not present

## 2020-09-22 ENCOUNTER — Encounter (HOSPITAL_COMMUNITY): Payer: Self-pay

## 2020-09-24 DIAGNOSIS — N049 Nephrotic syndrome with unspecified morphologic changes: Secondary | ICD-10-CM | POA: Diagnosis not present

## 2020-09-24 DIAGNOSIS — R601 Generalized edema: Secondary | ICD-10-CM | POA: Diagnosis not present

## 2020-09-24 DIAGNOSIS — N1832 Chronic kidney disease, stage 3b: Secondary | ICD-10-CM | POA: Diagnosis not present

## 2020-09-24 DIAGNOSIS — I129 Hypertensive chronic kidney disease with stage 1 through stage 4 chronic kidney disease, or unspecified chronic kidney disease: Secondary | ICD-10-CM | POA: Diagnosis not present

## 2020-09-24 DIAGNOSIS — E1122 Type 2 diabetes mellitus with diabetic chronic kidney disease: Secondary | ICD-10-CM | POA: Diagnosis not present

## 2020-09-24 DIAGNOSIS — N179 Acute kidney failure, unspecified: Secondary | ICD-10-CM | POA: Diagnosis not present

## 2020-09-25 ENCOUNTER — Other Ambulatory Visit: Payer: Self-pay

## 2020-09-25 ENCOUNTER — Encounter (HOSPITAL_BASED_OUTPATIENT_CLINIC_OR_DEPARTMENT_OTHER): Payer: Medicare HMO | Attending: Internal Medicine | Admitting: Internal Medicine

## 2020-09-25 DIAGNOSIS — N049 Nephrotic syndrome with unspecified morphologic changes: Secondary | ICD-10-CM

## 2020-09-25 DIAGNOSIS — E11621 Type 2 diabetes mellitus with foot ulcer: Secondary | ICD-10-CM | POA: Insufficient documentation

## 2020-09-25 DIAGNOSIS — I10 Essential (primary) hypertension: Secondary | ICD-10-CM | POA: Diagnosis not present

## 2020-09-25 DIAGNOSIS — E1165 Type 2 diabetes mellitus with hyperglycemia: Secondary | ICD-10-CM | POA: Diagnosis not present

## 2020-09-25 DIAGNOSIS — E11622 Type 2 diabetes mellitus with other skin ulcer: Secondary | ICD-10-CM | POA: Insufficient documentation

## 2020-09-25 DIAGNOSIS — E1121 Type 2 diabetes mellitus with diabetic nephropathy: Secondary | ICD-10-CM | POA: Insufficient documentation

## 2020-09-25 DIAGNOSIS — L97519 Non-pressure chronic ulcer of other part of right foot with unspecified severity: Secondary | ICD-10-CM | POA: Insufficient documentation

## 2020-09-25 DIAGNOSIS — Z79899 Other long term (current) drug therapy: Secondary | ICD-10-CM | POA: Diagnosis not present

## 2020-09-25 DIAGNOSIS — L97829 Non-pressure chronic ulcer of other part of left lower leg with unspecified severity: Secondary | ICD-10-CM | POA: Diagnosis not present

## 2020-09-25 DIAGNOSIS — L97819 Non-pressure chronic ulcer of other part of right lower leg with unspecified severity: Secondary | ICD-10-CM | POA: Diagnosis not present

## 2020-09-25 NOTE — Progress Notes (Signed)
[name] [name], [name] [name] ([id number]) Visit Report for 09/25/2020 Abuse/Suicide Risk Screen Details Patient Name: Date of Service: [name] [name], [name] [name] 09/25/2020 2:45 PM Medical Record Number: [id number] Patient Account Number: [id number] Date of Birth/Sex: Treating RN: [birth year] (60 y.o. Male) [name] [name] Primary Care [name] [name]: [name] [name] Other [name]: Referring [name] [name]: Treating [name] [name]/Extender: [name] [name] in Treatment: 0 Abuse/Suicide Risk Screen Items Answer ABUSE RISK SCREEN: Has anyone close to you tried to hurt or harm you recentlyo No Do you feel uncomfortable with anyone in your familyo No Has anyone forced you do things that you didnt want to doo No Electronic Signature(s) Signed: 09/25/2020 5:30:00 PM [name]: [name] [name]: [name] [name] on 09/25/2020 15:02:00 -------------------------------------------------------------------------------- Activities of Daily Living Details Patient Name: Date of Service: [name] [name], [name] [name] 09/25/2020 2:45 PM Medical Record Number: [id number] Patient Account Number: [id number] Date of Birth/Sex: Treating RN: 04-11-1961 (60 y.o. Male) [name] [name] Primary Care [name] [name]: [name] [name] Other [name]: Referring [name] [name]: Treating [name] [name]/Extender: [name] [name] in Treatment: 0 Activities of Daily Living Items Answer Activities of Daily Living (Please select one for each item) Drive Automobile Completely Able T Medications ake Completely Able Use T elephone Completely Able Care for Appearance Completely Able Use T oilet Completely Able Bath / Shower Completely Able Dress Self Completely Able Feed Self Completely Able Walk Completely Able Get In / Out Bed Completely Able Housework Completely Able Prepare Meals Completely [place] for Self Completely Able Electronic Signature(s) Signed: 09/25/2020 5:30:00 PM [name]: [name] Hammock  RN Entered [name]: [name] [name] on 09/25/2020 15:06:43 -------------------------------------------------------------------------------- Education Screening Details Patient Name: Date of Service: [name] [name], [name] [name] 09/25/2020 2:45 PM Medical Record Number: [id number] Patient Account Number: [id number] Date of Birth/Sex: Treating RN: [birth year] (59 y.o. Male) [name] [name] Primary Care [name] [name]: [name] [name] Other [name]: Referring [name] [name]: Treating [name] [name]/Extender: [name] [name] in Treatment: 0 Primary Learner Assessed: Patient Learning Preferences/Education Level/Primary Language Learning Preference: Explanation, Demonstration, Communication Board, Printed Material Highest Education Level: High School Preferred Language: English Cognitive Barrier Language Barrier: No Translator Needed: No Memory Deficit: No Emotional Barrier: No Cultural/Religious Beliefs Affecting Medical Care: No Physical Barrier Impaired Vision: Yes Glasses Impaired Hearing: No Decreased Hand dexterity: No Knowledge/Comprehension Knowledge Level: High Comprehension Level: High Ability to understand written instructions: High Ability to understand verbal instructions: High Motivation Anxiety Level: Calm Cooperation: Cooperative Education Importance: Denies Need Interest in Health Problems: Asks Questions Perception: Coherent Willingness to Engage in Self-Management High Activities: Readiness to Engage in Self-Management High Activities: Electronic Signature(s) Signed: 09/25/2020 5:30:00 PM [name]: [name] [name]: [name] [name] on 09/25/2020 15:06:16 -------------------------------------------------------------------------------- Fall Risk Assessment Details Patient Name: Date of Service: [name] [name], [name] [name] 09/25/2020 2:45 PM Medical Record Number: [id number] Patient Account Number: [id number] Date of Birth/Sex: Treating RN: [birth year] (59 y.o. Male)  [name] [name] Primary Care [name] [name]: [name] [name] Other [name]: Referring [name] [name]: Treating [name] [name]/Extender: [name] [name] in Treatment: 0 Fall Risk Assessment Items Have you had 2 or more falls in the last 12 monthso 0 Yes Have you had any fall that resulted in injury in the last 12 monthso 0 Yes FALLS RISK SCREEN History of falling - immediate or within 3 months 25 Yes Secondary diagnosis (Do you have 2 or more medical diagnoseso) 0 No Ambulatory aid None/bed rest/wheelchair/nurse 0 Yes Crutches/cane/walker 0 No Furniture 0 No Intravenous therapy Access/Saline/Heparin Lock 0 No Gait/Transferring Normal/ bed rest/ wheelchair 0 No Weak (short steps with or without shuffle, stooped but  able to lift head while walking, may seek 10 Yes support from furniture) Impaired (short steps with shuffle, may have difficulty arising from chair, head down, impaired 0 No balance) Mental Status Oriented to own ability 0 No Electronic Signature(s) Signed: 09/25/2020 5:30:00 PM [name]: [name] [name]: [name] [name] on 09/25/2020 15:02:24 -------------------------------------------------------------------------------- Foot Assessment Details Patient Name: Date of Service: [name] [name], [name] [name] 09/25/2020 2:45 PM Medical Record Number: [name] [name] Patient Account Number: [id number] Date of Birth/Sex: Treating RN: 19-Sep-1960 (59 y.o. Male) [name] [name] Primary Care [name] [name]: [name] [name] Other [name]: Referring [name] [name]: Treating [name] [name]/Extender: [name] [name] in Treatment: 0 Foot Assessment Items Site Locations + = Sensation present, - = Sensation absent, C = Callus, U = Ulcer R = Redness, W = Warmth, M = Maceration, PU = Pre-ulcerative lesion F = Fissure, S = Swelling, D = Dryness Assessment Right: Left: Other Deformity: No No Prior Foot Ulcer: No No Prior Amputation: No No Charcot Joint: No No Ambulatory  Status: Ambulatory With Help Assistance Device: Wheelchair Gait: Steady Electronic Signature(s) Signed: 09/25/2020 5:30:00 PM [name]: [name] [name]: [name] [name] on 09/25/2020 15:19:05 -------------------------------------------------------------------------------- Nutrition Risk Screening Details Patient Name: Date of Service: [name] [name], [name] [name] 09/25/2020 2:45 PM Medical Record Number: [name] [name] Patient Account Number: [id number] Date of Birth/Sex: Treating RN: Mar 18, 1961 (59 y.o. Male) [name] [name] Primary Care Anastazja Isaac: [name] [name] Other [name]: Referring Ashiah Karpowicz: Treating Raley Novicki/Extender: [name] [name] in Treatment: 0 Height (in): Weight (lbs): Body Mass Index (BMI): Nutrition Risk Screening Items Score Screening NUTRITION RISK SCREEN: I have an illness or condition that made me change the kind and/or amount of food I eat 0 No I eat fewer than two meals per day 0 No I eat few fruits and vegetables, or milk products 0 No I have three or more drinks of beer, liquor or wine almost every day 0 No I have tooth or mouth problems that make it hard for me to eat 0 No I don't always have enough money to buy the food I need 0 No I eat alone most of the time 0 No I take three or more different prescribed or over-the-counter drugs a day 0 No Without wanting to, I have lost or gained 10 pounds in the last six months 0 No I am not always physically able to shop, cook and/or feed myself 0 No Nutrition Protocols Good Risk Protocol 0 No interventions needed Moderate Risk Protocol High Risk Proctocol Risk Level: Good Risk Score: 0 Electronic Signature(s) Signed: 09/25/2020 5:30:00 PM [name]: [name] [name]: [name] [name] on 09/25/2020 15:02:29

## 2020-09-25 NOTE — Progress Notes (Signed)
[name] [name], [name] [name] ([id number]) Visit Report for 09/25/2020 Chief Complaint Document Details Patient Name: Date of Service: [name] [name], [name] [name] 09/25/2020 2:45 PM Medical Record Number: [id number] Patient Account Number: [id number] Date of Birth/Sex: Treating RN: 1961-02-20 (60 y.o. Male) Lorrin Jackson Primary Care Provider: [name] Other Clinician: Referring Provider: Treating Provider/Extender: [name] in Treatment: 0 Information Obtained from: Patient Chief Complaint Bilateral lower extremity wounds Electronic Signature(s) Signed: 09/25/2020 4:43:17 PM By: Kalman Shan DO Entered By: Kalman Shan on 09/25/2020 16:26:19 -------------------------------------------------------------------------------- HPI Details Patient Name: Date of Service: [name] [name], [name] [name] 09/25/2020 2:45 PM Medical Record Number: [id number] Patient Account Number: [id number] Date of Birth/Sex: Treating RN: [birth year] (59 y.o. Male) Lorrin Jackson Primary Care Provider: [name] Other Clinician: Referring Provider: Treating Provider/Extender: [name] in Treatment: 0 History of Present Illness HPI Description: [name] [name] is a 60 year old male with a past medical history of type 2 diabetes, nephrotic syndorme, essential hypertension that presents today with bilateral lower extremity wounds. 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 He is currently taking torsemide and metolazone. During his hospital stay he had compression wraps placed to his legs bilaterally. He was discharged on 3/31 and the wraps were removed about 1 week ago. He had blister formation throughout his legs and these have almost all opened. He has skin breakdown with serous drainage scattered throughout his legs bilaterally. He currently does not do anything for dressing changes. He states that for the  past week these have remained stable. He denies fever/chills or increased warmth or erythema to the lower extremities bilaterally. Electronic Signature(s) Signed: 09/25/2020 4:43:17 PM By: Kalman Shan DO Entered By: Kalman Shan on 09/25/2020 16:28:33 -------------------------------------------------------------------------------- Physical Exam Details Patient Name: Date of Service: [name] [name], [name] [name] 09/25/2020 2:45 PM Medical Record Number: [id number] Patient Account Number: [id number] Date of Birth/Sex: Treating RN: 12-10-1960 (60 y.o. Male) Lorrin Jackson Primary Care Provider: Other Clinician: [name] Referring Provider: Treating Provider/Extender: [name] in Treatment: 0 Constitutional respirations regular, non-labored and within target range for patient.Marland Kitchen Psychiatric pleasant and cooperative. Notes Lower extremities bilaterally: There are scattered blisters with open wounds limited to skin breakdown throughout. Doppler with brisk signal to both DP pulses bilaterally. No signs of infection. Electronic Signature(s) Signed: 09/25/2020 4:43:17 PM By: Kalman Shan DO Entered By: Kalman Shan on 09/25/2020 16:30:10 -------------------------------------------------------------------------------- Physician Orders Details Patient Name: Date of Service: [name] [name], [name] [name] 09/25/2020 2:45 PM Medical Record Number: [id number] Patient Account Number: [id number] Date of Birth/Sex: Treating RN: Mar 13, 1961 (59 y.o. Male) Rhae Hammock Primary Care Provider: [name] Other Clinician: Referring Provider: Treating Provider/Extender: [name] in Treatment: 0 Verbal / Phone Orders: No Diagnosis Coding ICD-10 Coding Code Description [phone] Non-pressure chronic ulcer of other part of left lower leg with unspecified severity L97.819 Non-pressure chronic ulcer of other part of right lower leg with unspecified  severity I10 Essential (primary) hypertension E11.65 Type 2 diabetes mellitus with hyperglycemia N04.9 Nephrotic syndrome with unspecified morphologic changes Follow-up Appointments Return Appointment in 1 week. Bathing/ Shower/ Hygiene May shower with protection but do not get wound dressing(s) wet. - May use cast wraps from [facility] or [facility]. Edema Control - Lymphedema / SCD / Other Elevate legs to the level of the heart or above for 30 minutes daily and/or when sitting, a frequency of: Avoid standing for long periods of time. Wound Treatment Wound #1 - Foot Wound Laterality: Dorsal, Right  Cleanser: Soap and Water 1 x Per Week/15 Days Discharge Instructions: May shower and wash wound with dial antibacterial soap and water prior to dressing change. Cleanser: Wound Cleanser 1 x Per Week/15 Days Discharge Instructions: Cleanse the wound with wound cleanser prior to applying a clean dressing using gauze sponges, not tissue or cotton balls. Peri-Wound Care: Triamcinolone 15 (g) 1 x Per Week/15 Days Discharge Instructions: Use triamcinolone 15 (g) as directed Peri-Wound Care: Zinc Oxide Ointment 30g tube 1 x Per Week/15 Days Discharge Instructions: Apply Zinc Oxide to periwound with each dressing change Peri-Wound Care: Sween Lotion (Moisturizing lotion) 1 x Per Week/15 Days Discharge Instructions: Apply moisturizing lotion as directed Prim Dressing: KerraCel Ag Gelling Fiber Dressing, 4x5 in (silver alginate) 1 x Per Week/15 Days ary Discharge Instructions: Apply silver alginate to wound bed as instructed Secondary Dressing: Woven Gauze Sponge, Non-Sterile 4x4 in 1 x Per Week/15 Days Discharge Instructions: Apply over primary dressing as directed. Secondary Dressing: ABD Pad, 5x9 1 x Per Week/15 Days Discharge Instructions: Apply over primary dressing as directed. Secondary Dressing: Zetuvit Plus 4x8 in 1 x Per Week/15 Days Discharge Instructions: Apply over primary dressing as  directed. Compression Wrap: Kerlix Roll 4.5x3.1 (in/yd) 1 x Per Week/15 Days Discharge Instructions: Apply Kerlix and Coban compression as directed. Compression Wrap: Coban Self-Adherent Wrap 4x5 (in/yd) 1 x Per Week/15 Days Discharge Instructions: Apply over Kerlix as directed. Wound #2 - Lower Leg Wound Laterality: Right, Posterior Cleanser: Soap and Water 1 x Per Week/15 Days Discharge Instructions: May shower and wash wound with dial antibacterial soap and water prior to dressing change. Cleanser: Wound Cleanser 1 x Per Week/15 Days Discharge Instructions: Cleanse the wound with wound cleanser prior to applying a clean dressing using gauze sponges, not tissue or cotton balls. Peri-Wound Care: Triamcinolone 15 (g) 1 x Per Week/15 Days Discharge Instructions: Use triamcinolone 15 (g) as directed Peri-Wound Care: Zinc Oxide Ointment 30g tube 1 x Per Week/15 Days Discharge Instructions: Apply Zinc Oxide to periwound with each dressing change Peri-Wound Care: Sween Lotion (Moisturizing lotion) 1 x Per Week/15 Days Discharge Instructions: Apply moisturizing lotion as directed Prim Dressing: KerraCel Ag Gelling Fiber Dressing, 4x5 in (silver alginate) 1 x Per Week/15 Days ary Discharge Instructions: Apply silver alginate to wound bed as instructed Secondary Dressing: Woven Gauze Sponge, Non-Sterile 4x4 in 1 x Per Week/15 Days Discharge Instructions: Apply over primary dressing as directed. Secondary Dressing: ABD Pad, 5x9 1 x Per Week/15 Days Discharge Instructions: Apply over primary dressing as directed. Secondary Dressing: Zetuvit Plus 4x8 in 1 x Per Week/15 Days Discharge Instructions: Apply over primary dressing as directed. Compression Wrap: Kerlix Roll 4.5x3.1 (in/yd) 1 x Per Week/15 Days Discharge Instructions: Apply Kerlix and Coban compression as directed. Compression Wrap: Coban Self-Adherent Wrap 4x5 (in/yd) 1 x Per Week/15 Days Discharge Instructions: Apply over Kerlix as  directed. Wound #3 - Lower Leg Wound Laterality: Left, Proximal Cleanser: Soap and Water 1 x Per Week/15 Days Discharge Instructions: May shower and wash wound with dial antibacterial soap and water prior to dressing change. Cleanser: Wound Cleanser 1 x Per Week/15 Days Discharge Instructions: Cleanse the wound with wound cleanser prior to applying a clean dressing using gauze sponges, not tissue or cotton balls. Peri-Wound Care: Triamcinolone 15 (g) 1 x Per Week/15 Days Discharge Instructions: Use triamcinolone 15 (g) as directed Peri-Wound Care: Zinc Oxide Ointment 30g tube 1 x Per Week/15 Days Discharge Instructions: Apply Zinc Oxide to periwound with each dressing change Peri-Wound Care: Sween Lotion (Moisturizing  lotion) 1 x Per Week/15 Days Discharge Instructions: Apply moisturizing lotion as directed Prim Dressing: KerraCel Ag Gelling Fiber Dressing, 4x5 in (silver alginate) 1 x Per Week/15 Days ary Discharge Instructions: Apply silver alginate to wound bed as instructed Secondary Dressing: Woven Gauze Sponge, Non-Sterile 4x4 in 1 x Per Week/15 Days Discharge Instructions: Apply over primary dressing as directed. Secondary Dressing: ABD Pad, 5x9 1 x Per Week/15 Days Discharge Instructions: Apply over primary dressing as directed. Secondary Dressing: Zetuvit Plus 4x8 in 1 x Per Week/15 Days Discharge Instructions: Apply over primary dressing as directed. Compression Wrap: Kerlix Roll 4.5x3.1 (in/yd) 1 x Per Week/15 Days Discharge Instructions: Apply Kerlix and Coban compression as directed. Compression Wrap: Coban Self-Adherent Wrap 4x5 (in/yd) 1 x Per Week/15 Days Discharge Instructions: Apply over Kerlix as directed. Wound #4 - Lower Leg Wound Laterality: Left, Anterior Cleanser: Soap and Water 1 x Per Week/15 Days Discharge Instructions: May shower and wash wound with dial antibacterial soap and water prior to dressing change. Cleanser: Wound Cleanser 1 x Per Week/15  Days Discharge Instructions: Cleanse the wound with wound cleanser prior to applying a clean dressing using gauze sponges, not tissue or cotton balls. Peri-Wound Care: Triamcinolone 15 (g) 1 x Per Week/15 Days Discharge Instructions: Use triamcinolone 15 (g) as directed Peri-Wound Care: Zinc Oxide Ointment 30g tube 1 x Per Week/15 Days Discharge Instructions: Apply Zinc Oxide to periwound with each dressing change Peri-Wound Care: Sween Lotion (Moisturizing lotion) 1 x Per Week/15 Days Discharge Instructions: Apply moisturizing lotion as directed Prim Dressing: KerraCel Ag Gelling Fiber Dressing, 4x5 in (silver alginate) 1 x Per Week/15 Days ary Discharge Instructions: Apply silver alginate to wound bed as instructed Secondary Dressing: Woven Gauze Sponge, Non-Sterile 4x4 in 1 x Per Week/15 Days Discharge Instructions: Apply over primary dressing as directed. Secondary Dressing: ABD Pad, 5x9 1 x Per Week/15 Days Discharge Instructions: Apply over primary dressing as directed. Secondary Dressing: Zetuvit Plus 4x8 in 1 x Per Week/15 Days Discharge Instructions: Apply over primary dressing as directed. Compression Wrap: Kerlix Roll 4.5x3.1 (in/yd) 1 x Per Week/15 Days Discharge Instructions: Apply Kerlix and Coban compression as directed. Compression Wrap: Coban Self-Adherent Wrap 4x5 (in/yd) 1 x Per Week/15 Days Discharge Instructions: Apply over Kerlix as directed. Wound #5 - Foot Wound Laterality: Dorsal, Left Cleanser: Soap and Water 1 x Per Week/15 Days Discharge Instructions: May shower and wash wound with dial antibacterial soap and water prior to dressing change. Cleanser: Wound Cleanser 1 x Per Week/15 Days Discharge Instructions: Cleanse the wound with wound cleanser prior to applying a clean dressing using gauze sponges, not tissue or cotton balls. Peri-Wound Care: Triamcinolone 15 (g) 1 x Per Week/15 Days Discharge Instructions: Use triamcinolone 15 (g) as directed Peri-Wound  Care: Zinc Oxide Ointment 30g tube 1 x Per Week/15 Days Discharge Instructions: Apply Zinc Oxide to periwound with each dressing change Peri-Wound Care: Sween Lotion (Moisturizing lotion) 1 x Per Week/15 Days Discharge Instructions: Apply moisturizing lotion as directed Prim Dressing: KerraCel Ag Gelling Fiber Dressing, 4x5 in (silver alginate) 1 x Per Week/15 Days ary Discharge Instructions: Apply silver alginate to wound bed as instructed Secondary Dressing: Woven Gauze Sponge, Non-Sterile 4x4 in 1 x Per Week/15 Days Discharge Instructions: Apply over primary dressing as directed. Secondary Dressing: ABD Pad, 5x9 1 x Per Week/15 Days Discharge Instructions: Apply over primary dressing as directed. Secondary Dressing: Zetuvit Plus 4x8 in 1 x Per Week/15 Days Discharge Instructions: Apply over primary dressing as directed. Compression Wrap: Kerlix Roll 4.5x3.1 (  in/yd) 1 x Per Week/15 Days Discharge Instructions: Apply Kerlix and Coban compression as directed. Compression Wrap: Coban Self-Adherent Wrap 4x5 (in/yd) 1 x Per Week/15 Days Discharge Instructions: Apply over Kerlix as directed. Wound #6 - Ankle Wound Laterality: Left, Medial Cleanser: Soap and Water 1 x Per Week/15 Days Discharge Instructions: May shower and wash wound with dial antibacterial soap and water prior to dressing change. Cleanser: Wound Cleanser 1 x Per Week/15 Days Discharge Instructions: Cleanse the wound with wound cleanser prior to applying a clean dressing using gauze sponges, not tissue or cotton balls. Peri-Wound Care: Triamcinolone 15 (g) 1 x Per Week/15 Days Discharge Instructions: Use triamcinolone 15 (g) as directed Peri-Wound Care: Zinc Oxide Ointment 30g tube 1 x Per Week/15 Days Discharge Instructions: Apply Zinc Oxide to periwound with each dressing change Peri-Wound Care: Sween Lotion (Moisturizing lotion) 1 x Per Week/15 Days Discharge Instructions: Apply moisturizing lotion as directed Prim  Dressing: KerraCel Ag Gelling Fiber Dressing, 4x5 in (silver alginate) 1 x Per Week/15 Days ary Discharge Instructions: Apply silver alginate to wound bed as instructed Secondary Dressing: Woven Gauze Sponge, Non-Sterile 4x4 in 1 x Per Week/15 Days Discharge Instructions: Apply over primary dressing as directed. Secondary Dressing: ABD Pad, 5x9 1 x Per Week/15 Days Discharge Instructions: Apply over primary dressing as directed. Secondary Dressing: Zetuvit Plus 4x8 in 1 x Per Week/15 Days Discharge Instructions: Apply over primary dressing as directed. Compression Wrap: Kerlix Roll 4.5x3.1 (in/yd) 1 x Per Week/15 Days Discharge Instructions: Apply Kerlix and Coban compression as directed. Compression Wrap: Coban Self-Adherent Wrap 4x5 (in/yd) 1 x Per Week/15 Days Discharge Instructions: Apply over Kerlix as directed. Wound #7 - T [place] o[place] Wound Laterality: Dorsal, Left Cleanser: Soap and Water 1 x Per Week/15 Days Discharge Instructions: May shower and wash wound with dial antibacterial soap and water prior to dressing change. Cleanser: Wound Cleanser 1 x Per Week/15 Days Discharge Instructions: Cleanse the wound with wound cleanser prior to applying a clean dressing using gauze sponges, not tissue or cotton balls. Peri-Wound Care: Triamcinolone 15 (g) 1 x Per Week/15 Days Discharge Instructions: Use triamcinolone 15 (g) as directed Peri-Wound Care: Zinc Oxide Ointment 30g tube 1 x Per Week/15 Days Discharge Instructions: Apply Zinc Oxide to periwound with each dressing change Peri-Wound Care: Sween Lotion (Moisturizing lotion) 1 x Per Week/15 Days Discharge Instructions: Apply moisturizing lotion as directed Prim Dressing: KerraCel Ag Gelling Fiber Dressing, 4x5 in (silver alginate) 1 x Per Week/15 Days ary Discharge Instructions: Apply silver alginate to wound bed as instructed Secondary Dressing: Woven Gauze Sponge, Non-Sterile 4x4 in 1 x Per Week/15 Days Discharge Instructions: Apply  over primary dressing as directed. Secondary Dressing: ABD Pad, 5x9 1 x Per Week/15 Days Discharge Instructions: Apply over primary dressing as directed. Secondary Dressing: Zetuvit Plus 4x8 in 1 x Per Week/15 Days Discharge Instructions: Apply over primary dressing as directed. Compression Wrap: Kerlix Roll 4.5x3.1 (in/yd) 1 x Per Week/15 Days Discharge Instructions: Apply Kerlix and Coban compression as directed. Compression Wrap: Coban Self-Adherent Wrap 4x5 (in/yd) 1 x Per Week/15 Days Discharge Instructions: Apply over Kerlix as directed. Wound #8 - Lower Leg Wound Laterality: Left, Posterior Cleanser: Soap and Water 1 x Per Week/15 Days Discharge Instructions: May shower and wash wound with dial antibacterial soap and water prior to dressing change. Cleanser: Wound Cleanser 1 x Per Week/15 Days Discharge Instructions: Cleanse the wound with wound cleanser prior to applying a clean dressing using gauze sponges, not tissue or cotton balls. Peri-Wound Care: Triamcinolone  15 (g) 1 x Per Week/15 Days Discharge Instructions: Use triamcinolone 15 (g) as directed Peri-Wound Care: Zinc Oxide Ointment 30g tube 1 x Per Week/15 Days Discharge Instructions: Apply Zinc Oxide to periwound with each dressing change Peri-Wound Care: Sween Lotion (Moisturizing lotion) 1 x Per Week/15 Days Discharge Instructions: Apply moisturizing lotion as directed Prim Dressing: KerraCel Ag Gelling Fiber Dressing, 4x5 in (silver alginate) 1 x Per Week/15 Days ary Discharge Instructions: Apply silver alginate to wound bed as instructed Secondary Dressing: Woven Gauze Sponge, Non-Sterile 4x4 in 1 x Per Week/15 Days Discharge Instructions: Apply over primary dressing as directed. Secondary Dressing: ABD Pad, 5x9 1 x Per Week/15 Days Discharge Instructions: Apply over primary dressing as directed. Secondary Dressing: Zetuvit Plus 4x8 in 1 x Per Week/15 Days Discharge Instructions: Apply over primary dressing as  directed. Compression Wrap: Kerlix Roll 4.5x3.1 (in/yd) 1 x Per Week/15 Days Discharge Instructions: Apply Kerlix and Coban compression as directed. Compression Wrap: Coban Self-Adherent Wrap 4x5 (in/yd) 1 x Per Week/15 Days Discharge Instructions: Apply over Kerlix as directed. Wound #9 - Ankle Wound Laterality: Left, Lateral Cleanser: Soap and Water 1 x Per Week/15 Days Discharge Instructions: May shower and wash wound with dial antibacterial soap and water prior to dressing change. Cleanser: Wound Cleanser 1 x Per Week/15 Days Discharge Instructions: Cleanse the wound with wound cleanser prior to applying a clean dressing using gauze sponges, not tissue or cotton balls. Peri-Wound Care: Triamcinolone 15 (g) 1 x Per Week/15 Days Discharge Instructions: Use triamcinolone 15 (g) as directed Peri-Wound Care: Zinc Oxide Ointment 30g tube 1 x Per Week/15 Days Discharge Instructions: Apply Zinc Oxide to periwound with each dressing change Peri-Wound Care: Sween Lotion (Moisturizing lotion) 1 x Per Week/15 Days Discharge Instructions: Apply moisturizing lotion as directed Prim Dressing: KerraCel Ag Gelling Fiber Dressing, 4x5 in (silver alginate) 1 x Per Week/15 Days ary Discharge Instructions: Apply silver alginate to wound bed as instructed Secondary Dressing: Woven Gauze Sponge, Non-Sterile 4x4 in 1 x Per Week/15 Days Discharge Instructions: Apply over primary dressing as directed. Secondary Dressing: ABD Pad, 5x9 1 x Per Week/15 Days Discharge Instructions: Apply over primary dressing as directed. Secondary Dressing: Zetuvit Plus 4x8 in 1 x Per Week/15 Days Discharge Instructions: Apply over primary dressing as directed. Compression Wrap: Kerlix Roll 4.5x3.1 (in/yd) 1 x Per Week/15 Days Discharge Instructions: Apply Kerlix and Coban compression as directed. Compression Wrap: Coban Self-Adherent Wrap 4x5 (in/yd) 1 x Per Week/15 Days Discharge Instructions: Apply over Kerlix as  directed. Electronic Signature(s) Signed: 09/25/2020 4:43:17 PM By: Kalman Shan DO Entered By: Kalman Shan on 09/25/2020 16:31:21 -------------------------------------------------------------------------------- Problem List Details Patient Name: Date of Service: [name] [name], [name] [name] 09/25/2020 2:45 PM Medical Record Number: [id number] Patient Account Number: [id number] Date of Birth/Sex: Treating RN: [birth year] (59 y.o. Male) Lorrin Jackson Primary Care Provider: [name] Other Clinician: Referring Provider: Treating Provider/Extender: [name] in Treatment: 0 Active Problems ICD-10 Encounter Code Description Active Date MDM Diagnosis L97.819 Non-pressure chronic ulcer of other part of right lower leg with unspecified 09/25/2020 No Yes severity L97.829 Non-pressure chronic ulcer of other part of left lower leg with unspecified 09/25/2020 No Yes severity E11.65 Type 2 diabetes mellitus with hyperglycemia 09/25/2020 No Yes N04.9 Nephrotic syndrome with unspecified morphologic changes 09/25/2020 No Yes I10 Essential (primary) hypertension 09/25/2020 No Yes Inactive Problems Resolved Problems Electronic Signature(s) Signed: 09/25/2020 4:43:17 PM By: Kalman Shan DO Entered By: Kalman Shan on 09/25/2020 16:42:38 -------------------------------------------------------------------------------- Progress Note Details Patient Name: Date of Service:  [name] [name], [name] [name] 09/25/2020 2:45 PM Medical Record Number: [id number] Patient Account Number: [id number] Date of Birth/Sex: Treating RN: 1960/08/28 (59 y.o. Male) Lorrin Jackson Primary Care Provider: [name] Other Clinician: Referring Provider: Treating Provider/Extender: [name] in Treatment: 0 Subjective Chief Complaint Information obtained from Patient Bilateral lower extremity wounds History of Present Illness (HPI) [name] [name] is a 60 year old male  with a past medical history of type 2 diabetes, nephrotic syndorme, essential hypertension that presents today with bilateral lower extremity wounds. 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 He is currently taking torsemide and metolazone. During his hospital stay he had compression wraps placed to his legs bilaterally. He was discharged on 3/31 and the wraps were removed about 1 week ago. He had blister formation throughout his legs and these have almost all opened. He has skin breakdown with serous drainage scattered throughout his legs bilaterally. He currently does not do anything for dressing changes. He states that for the past week these have remained stable. He denies fever/chills or increased warmth or erythema to the lower extremities bilaterally. Patient History Information obtained from Patient. Allergies No Known Allergies Family History Cancer - Father, Diabetes - Mother, Hypertension - Mother, No family history of Heart Disease, Hereditary Spherocytosis, Kidney Disease, Lung Disease, Seizures, Stroke, Thyroid Problems, Tuberculosis. Social History Never smoker, Marital Status - Single, Alcohol Use - Never, Drug Use - No History, Caffeine Use - Never. Medical History Eyes Denies history of Cataracts, Glaucoma, Optic Neuritis Ear/Nose/Mouth/Throat Denies history of Chronic sinus problems/congestion, Middle ear problems Hematologic/Lymphatic Denies history of Anemia, Hemophilia, Human Immunodeficiency Virus, Lymphedema, Sickle Cell Disease Respiratory Denies history of Aspiration, Asthma, Chronic Obstructive Pulmonary Disease (COPD), Pneumothorax, Sleep Apnea, Tuberculosis Cardiovascular Patient has history of Congestive Heart Failure, Hypertension Denies history of Angina, Arrhythmia, Coronary Artery Disease, Deep Vein Thrombosis, Hypotension, Myocardial Infarction, Peripheral Arterial  Disease, Peripheral Venous Disease, Phlebitis, Vasculitis Gastrointestinal Denies history of Cirrhosis , Colitis, Crohnoos, Hepatitis A, Hepatitis B, Hepatitis C Endocrine Patient has history of Type II Diabetes Denies history of Type I Diabetes Immunological Denies history of Lupus Erythematosus, Raynaudoos, Scleroderma Integumentary (Skin) Denies history of History of Burn Musculoskeletal Patient has history of Rheumatoid Arthritis Denies history of Gout, Osteoarthritis, Osteomyelitis Neurologic Patient has history of Neuropathy Denies history of Dementia, Quadriplegia, Paraplegia, Seizure Disorder Oncologic Denies history of Received Chemotherapy, Received Radiation Psychiatric Denies history of Anorexia/bulimia, Confinement Anxiety Patient is treated with Oral Agents. Blood sugar is tested. Hospitalization/Surgery History - cataract extraction bilateral. - tonsillectomy. Medical A Surgical History Notes nd Gastrointestinal GERD Review of Systems (ROS) Constitutional Symptoms (General Health) Denies complaints or symptoms of Fatigue, Fever, Chills, Marked Weight Change. Eyes Complains or has symptoms of Glasses / Contacts. Denies complaints or symptoms of Dry Eyes, Vision Changes. Ear/Nose/Mouth/Throat Denies complaints or symptoms of Chronic sinus problems or rhinitis. Respiratory Denies complaints or symptoms of Chronic or frequent coughs, Shortness of Breath. Cardiovascular Denies complaints or symptoms of Chest pain. Gastrointestinal Denies complaints or symptoms of Frequent diarrhea, Nausea, Vomiting. Endocrine Complains or has symptoms of Heat/cold intolerance. Genitourinary Denies complaints or symptoms of Frequent urination. Integumentary (Skin) Denies complaints or symptoms of Wounds. Musculoskeletal Denies complaints or symptoms of Muscle Pain, Muscle Weakness. Neurologic Denies complaints or symptoms of Numbness/parasthesias. Psychiatric Denies  complaints or symptoms of Claustrophobia, Suicidal. Objective Constitutional respirations regular, non-labored and within target range for patient.. Vitals Time Taken: 3:04 PM, Height: 70 in, Source: Stated,  Weight: 148 lbs, Source: Stated, BMI: 21.2, Temperature: 98.4 F, Pulse: 60 bpm, Respiratory Rate: 17 breaths/min, Blood Pressure: 147/68 mmHg, Capillary Blood Glucose: 102 mg/dl. Psychiatric pleasant and cooperative. General Notes: Lower extremities bilaterally: There are scattered blisters with open wounds limited to skin breakdown throughout. Doppler with brisk signal to both DP pulses bilaterally. No signs of infection. Integumentary (Hair, Skin) Wound #1 status is Open. Original cause of wound was Blister. The date acquired was: 09/10/2020. The wound is located on the Right,Dorsal Foot. The wound measures 6cm length x 7.5cm width x 0.1cm depth; 35.343cm^2 area and 3.534cm^3 volume. There is no tunneling or undermining noted. There is a large amount of serosanguineous drainage noted. The wound margin is distinct with the outline attached to the wound base. There is large (67-100%) red, pink granulation within the wound bed. There is no necrotic tissue within the wound bed. Wound #2 status is Open. Original cause of wound was Blister. The date acquired was: 09/10/2020. The wound is located on the Right,Posterior Lower Leg. The wound measures 8.5cm length x 15.5cm width x 0.1cm depth; 103.476cm^2 area and 10.348cm^3 volume. There is no tunneling or undermining noted. There is a large amount of serosanguineous drainage noted. The wound margin is distinct with the outline attached to the wound base. There is large (67-100%) red, pink granulation within the wound bed. There is no necrotic tissue within the wound bed. Wound #3 status is Open. Original cause of wound was Blister. The date acquired was: 09/10/2020. The wound is located on the Left,Proximal Lower Leg. The wound measures 1.2cm length  x 0.7cm width x 0.1cm depth; 0.66cm^2 area and 0.066cm^3 volume. There is no tunneling or undermining noted. There is a medium amount of serosanguineous drainage noted. The wound margin is distinct with the outline attached to the wound base. There is large (67-100%) red, pink granulation within the wound bed. There is no necrotic tissue within the wound bed. Wound #4 status is Open. Original cause of wound was Blister. The date acquired was: 09/10/2020. The wound is located on the Left,Anterior Lower Leg. The wound measures 7.3cm length x 11cm width x 0.1cm depth; 63.067cm^2 area and 6.307cm^3 volume. There is no tunneling or undermining noted. There is a large amount of serosanguineous drainage noted. The wound margin is distinct with the outline attached to the wound base. There is large (67-100%) red, pink granulation within the wound bed. There is no necrotic tissue within the wound bed. Wound #5 status is Open. Original cause of wound was Blister. The date acquired was: 09/10/2020. The wound is located on the Left,Dorsal Foot. The wound measures 6cm length x 6.6cm width x 0.1cm depth; 31.102cm^2 area and 3.11cm^3 volume. There is no tunneling or undermining noted. There is a large amount of serosanguineous drainage noted. The wound margin is distinct with the outline attached to the wound base. There is large (67-100%) red, pink granulation within the wound bed. There is no necrotic tissue within the wound bed. Wound #6 status is Open. Original cause of wound was Blister. The date acquired was: 09/10/2020. The wound is located on the Left,Medial Ankle. The wound measures 2.5cm length x 4.1cm width x 0.1cm depth; 8.05cm^2 area and 0.805cm^3 volume. There is no tunneling or undermining noted. There is a large amount of serosanguineous drainage noted. The wound margin is distinct with the outline attached to the wound base. There is large (67-100%) red, pink granulation within the wound bed. There is  no necrotic tissue within the  wound bed. Wound #7 status is Open. Original cause of wound was Blister. The date acquired was: 09/10/2020. The wound is located on the Left,Dorsal T Great. The oe wound measures 3cm length x 2.8cm width x 0.1cm depth; 6.597cm^2 area and 0.66cm^3 volume. There is no tunneling or undermining noted. There is a large amount of serosanguineous drainage noted. The wound margin is distinct with the outline attached to the wound base. There is large (67-100%) red, pink granulation within the wound bed. There is no necrotic tissue within the wound bed. Wound #8 status is Open. Original cause of wound was Blister. The date acquired was: 09/10/2020. The wound is located on the Left,Posterior Lower Leg. The wound measures 14.5cm length x 7cm width x 0.1cm depth; 79.718cm^2 area and 7.972cm^3 volume. There is no tunneling or undermining noted. There is a large amount of serosanguineous drainage noted. The wound margin is distinct with the outline attached to the wound base. There is large (67-100%) red, pink granulation within the wound bed. There is no necrotic tissue within the wound bed. Wound #9 status is Open. Original cause of wound was Blister. The date acquired was: 09/10/2020. The wound is located on the Left,Lateral Ankle. The wound measures 3cm length x 1.7cm width x 0.1cm depth; 4.006cm^2 area and 0.401cm^3 volume. There is no tunneling or undermining noted. There is a large amount of serosanguineous drainage noted. The wound margin is distinct with the outline attached to the wound base. There is large (67-100%) red, pink granulation within the wound bed. There is no necrotic tissue within the wound bed. 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 Since removal he has had blisters that have opened. There are no signs of infection. He does not have a history of venous insufficiency and I think the swelling was due to nephrotic syndrome. He is on a new diuretic course and I suspect this will help with the swelling in his legs for healing. I am wondering if the compression wraps were placed correctly due to the markings on his legs from where the wraps were placed. It is also not clear if these were changed or when they were first placed. I think he would benefit from silver alginate, absorbent pads with a light Kerlix and Coban wrap to help keep in place. He can keep this in place for 1 week. I told him to call our office with any questions or concerns. He can always have a nurse visit if he needs these changed. He expressed understanding. Plan Follow-up Appointments: Return Appointment in 1 week. Bathing/ Shower/ Hygiene: May shower with protection but do not get wound dressing(s) wet. - May use cast wraps from [facility] or [facility]. Edema Control - Lymphedema / SCD / Other: Elevate legs to the level of the heart or above for 30 minutes daily and/or when sitting, a frequency of: Avoid standing for long periods of time. WOUND #1: - Foot Wound Laterality: Dorsal, Right Cleanser: Soap and Water 1 x Per Week/15 Days Discharge Instructions: May shower and wash wound with dial antibacterial soap and water prior to dressing change. Cleanser: Wound Cleanser 1 x Per Week/15 Days Discharge Instructions: Cleanse the wound with wound cleanser prior  to applying a clean dressing using gauze sponges, not tissue or cotton balls. Peri-Wound Care: Triamcinolone 15 (g) 1 x Per Week/15 Days Discharge Instructions: Use triamcinolone 15 (g) as directed Peri-Wound Care: Zinc Oxide Ointment 30g tube 1 x  Per Week/15 Days Discharge Instructions: Apply Zinc Oxide to periwound with each dressing change Peri-Wound Care: Sween Lotion (Moisturizing lotion) 1 x Per Week/15 Days Discharge Instructions: Apply moisturizing lotion as directed Prim Dressing: KerraCel Ag Gelling Fiber Dressing, 4x5 in (silver alginate) 1 x Per Week/15 Days ary Discharge Instructions: Apply silver alginate to wound bed as instructed Secondary Dressing: Woven Gauze Sponge, Non-Sterile 4x4 in 1 x Per Week/15 Days Discharge Instructions: Apply over primary dressing as directed. Secondary Dressing: ABD Pad, 5x9 1 x Per Week/15 Days Discharge Instructions: Apply over primary dressing as directed. Secondary Dressing: Zetuvit Plus 4x8 in 1 x Per Week/15 Days Discharge Instructions: Apply over primary dressing as directed. Com pression Wrap: Kerlix Roll 4.5x3.1 (in/yd) 1 x Per Week/15 Days Discharge Instructions: Apply Kerlix and Coban compression as directed. Com pression Wrap: Coban Self-Adherent Wrap 4x5 (in/yd) 1 x Per Week/15 Days Discharge Instructions: Apply over Kerlix as directed. WOUND #2: - Lower Leg Wound Laterality: Right, Posterior Cleanser: Soap and Water 1 x Per Week/15 Days Discharge Instructions: May shower and wash wound with dial antibacterial soap and water prior to dressing change. Cleanser: Wound Cleanser 1 x Per Week/15 Days Discharge Instructions: Cleanse the wound with wound cleanser prior to applying a clean dressing using gauze sponges, not tissue or cotton balls. Peri-Wound Care: Triamcinolone 15 (g) 1 x Per Week/15 Days Discharge Instructions: Use triamcinolone 15 (g) as directed Peri-Wound Care: Zinc Oxide Ointment 30g tube 1 x Per Week/15 Days Discharge Instructions: Apply Zinc Oxide to periwound with each dressing change Peri-Wound Care: Sween Lotion (Moisturizing lotion) 1 x Per Week/15 Days Discharge Instructions: Apply moisturizing lotion as directed Prim Dressing: KerraCel Ag Gelling Fiber  Dressing, 4x5 in (silver alginate) 1 x Per Week/15 Days ary Discharge Instructions: Apply silver alginate to wound bed as instructed Secondary Dressing: Woven Gauze Sponge, Non-Sterile 4x4 in 1 x Per Week/15 Days Discharge Instructions: Apply over primary dressing as directed. Secondary Dressing: ABD Pad, 5x9 1 x Per Week/15 Days Discharge Instructions: Apply over primary dressing as directed. Secondary Dressing: Zetuvit Plus 4x8 in 1 x Per Week/15 Days Discharge Instructions: Apply over primary dressing as directed. Com pression Wrap: Kerlix Roll 4.5x3.1 (in/yd) 1 x Per Week/15 Days Discharge Instructions: Apply Kerlix and Coban compression as directed. Com pression Wrap: Coban Self-Adherent Wrap 4x5 (in/yd) 1 x Per Week/15 Days Discharge Instructions: Apply over Kerlix as directed. WOUND #3: - Lower Leg Wound Laterality: Left, Proximal Cleanser: Soap and Water 1 x Per Week/15 Days Discharge Instructions: May shower and wash wound with dial antibacterial soap and water prior to dressing change. Cleanser: Wound Cleanser 1 x Per Week/15 Days Discharge Instructions: Cleanse the wound with wound cleanser prior to applying a clean dressing using gauze sponges, not tissue or cotton balls. Peri-Wound Care: Triamcinolone 15 (g) 1 x Per Week/15 Days Discharge Instructions: Use triamcinolone 15 (g) as directed Peri-Wound Care: Zinc Oxide Ointment 30g tube 1 x Per Week/15 Days Discharge Instructions: Apply Zinc Oxide to periwound with each dressing change Peri-Wound Care: Sween Lotion (Moisturizing lotion) 1 x Per Week/15 Days Discharge Instructions: Apply moisturizing lotion as directed Prim Dressing: KerraCel Ag Gelling Fiber Dressing, 4x5 in (silver alginate) 1 x Per Week/15 Days ary Discharge Instructions: Apply silver alginate to wound bed as  instructed Secondary Dressing: Woven Gauze Sponge, Non-Sterile 4x4 in 1 x Per Week/15 Days Discharge Instructions: Apply over primary dressing as  directed. Secondary Dressing: ABD Pad, 5x9 1 x Per Week/15 Days Discharge Instructions: Apply over primary dressing as directed. Secondary Dressing: Zetuvit Plus 4x8 in 1 x Per Week/15 Days Discharge Instructions: Apply over primary dressing as directed. Com pression Wrap: Kerlix Roll 4.5x3.1 (in/yd) 1 x Per Week/15 Days Discharge Instructions: Apply Kerlix and Coban compression as directed. Com pression Wrap: Coban Self-Adherent Wrap 4x5 (in/yd) 1 x Per Week/15 Days Discharge Instructions: Apply over Kerlix as directed. WOUND #4: - Lower Leg Wound Laterality: Left, Anterior Cleanser: Soap and Water 1 x Per Week/15 Days Discharge Instructions: May shower and wash wound with dial antibacterial soap and water prior to dressing change. Cleanser: Wound Cleanser 1 x Per Week/15 Days Discharge Instructions: Cleanse the wound with wound cleanser prior to applying a clean dressing using gauze sponges, not tissue or cotton balls. Peri-Wound Care: Triamcinolone 15 (g) 1 x Per Week/15 Days Discharge Instructions: Use triamcinolone 15 (g) as directed Peri-Wound Care: Zinc Oxide Ointment 30g tube 1 x Per Week/15 Days Discharge Instructions: Apply Zinc Oxide to periwound with each dressing change Peri-Wound Care: Sween Lotion (Moisturizing lotion) 1 x Per Week/15 Days Discharge Instructions: Apply moisturizing lotion as directed Prim Dressing: KerraCel Ag Gelling Fiber Dressing, 4x5 in (silver alginate) 1 x Per Week/15 Days ary Discharge Instructions: Apply silver alginate to wound bed as instructed Secondary Dressing: Woven Gauze Sponge, Non-Sterile 4x4 in 1 x Per Week/15 Days Discharge Instructions: Apply over primary dressing as directed. Secondary Dressing: ABD Pad, 5x9 1 x Per Week/15 Days Discharge Instructions: Apply over primary dressing as directed. Secondary Dressing: Zetuvit Plus 4x8 in 1 x Per Week/15 Days Discharge Instructions: Apply over primary dressing as directed. Com pression Wrap:  Kerlix Roll 4.5x3.1 (in/yd) 1 x Per Week/15 Days Discharge Instructions: Apply Kerlix and Coban compression as directed. Com pression Wrap: Coban Self-Adherent Wrap 4x5 (in/yd) 1 x Per Week/15 Days Discharge Instructions: Apply over Kerlix as directed. WOUND #5: - Foot Wound Laterality: Dorsal, Left Cleanser: Soap and Water 1 x Per Week/15 Days Discharge Instructions: May shower and wash wound with dial antibacterial soap and water prior to dressing change. Cleanser: Wound Cleanser 1 x Per Week/15 Days Discharge Instructions: Cleanse the wound with wound cleanser prior to applying a clean dressing using gauze sponges, not tissue or cotton balls. Peri-Wound Care: Triamcinolone 15 (g) 1 x Per Week/15 Days Discharge Instructions: Use triamcinolone 15 (g) as directed Peri-Wound Care: Zinc Oxide Ointment 30g tube 1 x Per Week/15 Days Discharge Instructions: Apply Zinc Oxide to periwound with each dressing change Peri-Wound Care: Sween Lotion (Moisturizing lotion) 1 x Per Week/15 Days Discharge Instructions: Apply moisturizing lotion as directed Prim Dressing: KerraCel Ag Gelling Fiber Dressing, 4x5 in (silver alginate) 1 x Per Week/15 Days ary Discharge Instructions: Apply silver alginate to wound bed as instructed Secondary Dressing: Woven Gauze Sponge, Non-Sterile 4x4 in 1 x Per Week/15 Days Discharge Instructions: Apply over primary dressing as directed. Secondary Dressing: ABD Pad, 5x9 1 x Per Week/15 Days Discharge Instructions: Apply over primary dressing as directed. Secondary Dressing: Zetuvit Plus 4x8 in 1 x Per Week/15 Days Discharge Instructions: Apply over primary dressing as directed. Com pression Wrap: Kerlix Roll 4.5x3.1 (in/yd) 1 x Per Week/15 Days Discharge Instructions: Apply Kerlix and Coban compression as directed. Com pression Wrap: Coban Self-Adherent Wrap 4x5 (in/yd) 1 x Per Week/15 Days Discharge Instructions: Apply over Kerlix as  directed. WOUND #6: - Ankle Wound  Laterality: Left, Medial Cleanser: Soap and Water 1 x Per Week/15 Days Discharge Instructions: May shower and wash wound with dial antibacterial soap and water prior to dressing change. Cleanser: Wound Cleanser 1 x Per Week/15 Days Discharge Instructions: Cleanse the wound with wound cleanser prior to applying a clean dressing using gauze sponges, not tissue or cotton balls. Peri-Wound Care: Triamcinolone 15 (g) 1 x Per Week/15 Days Discharge Instructions: Use triamcinolone 15 (g) as directed Peri-Wound Care: Zinc Oxide Ointment 30g tube 1 x Per Week/15 Days Discharge Instructions: Apply Zinc Oxide to periwound with each dressing change Peri-Wound Care: Sween Lotion (Moisturizing lotion) 1 x Per Week/15 Days Discharge Instructions: Apply moisturizing lotion as directed Prim Dressing: KerraCel Ag Gelling Fiber Dressing, 4x5 in (silver alginate) 1 x Per Week/15 Days ary Discharge Instructions: Apply silver alginate to wound bed as instructed Secondary Dressing: Woven Gauze Sponge, Non-Sterile 4x4 in 1 x Per Week/15 Days Discharge Instructions: Apply over primary dressing as directed. Secondary Dressing: ABD Pad, 5x9 1 x Per Week/15 Days Discharge Instructions: Apply over primary dressing as directed. Secondary Dressing: Zetuvit Plus 4x8 in 1 x Per Week/15 Days Discharge Instructions: Apply over primary dressing as directed. Com pression Wrap: Kerlix Roll 4.5x3.1 (in/yd) 1 x Per Week/15 Days Discharge Instructions: Apply Kerlix and Coban compression as directed. Com pression Wrap: Coban Self-Adherent Wrap 4x5 (in/yd) 1 x Per Week/15 Days Discharge Instructions: Apply over Kerlix as directed. WOUND #7: - T Great Wound Laterality: Dorsal, Left oe Cleanser: Soap and Water 1 x Per Week/15 Days Discharge Instructions: May shower and wash wound with dial antibacterial soap and water prior to dressing change. Cleanser: Wound Cleanser 1 x Per Week/15 Days Discharge Instructions: Cleanse the wound  with wound cleanser prior to applying a clean dressing using gauze sponges, not tissue or cotton balls. Peri-Wound Care: Triamcinolone 15 (g) 1 x Per Week/15 Days Discharge Instructions: Use triamcinolone 15 (g) as directed Peri-Wound Care: Zinc Oxide Ointment 30g tube 1 x Per Week/15 Days Discharge Instructions: Apply Zinc Oxide to periwound with each dressing change Peri-Wound Care: Sween Lotion (Moisturizing lotion) 1 x Per Week/15 Days Discharge Instructions: Apply moisturizing lotion as directed Prim Dressing: KerraCel Ag Gelling Fiber Dressing, 4x5 in (silver alginate) 1 x Per Week/15 Days ary Discharge Instructions: Apply silver alginate to wound bed as instructed Secondary Dressing: Woven Gauze Sponge, Non-Sterile 4x4 in 1 x Per Week/15 Days Discharge Instructions: Apply over primary dressing as directed. Secondary Dressing: ABD Pad, 5x9 1 x Per Week/15 Days Discharge Instructions: Apply over primary dressing as directed. Secondary Dressing: Zetuvit Plus 4x8 in 1 x Per Week/15 Days Discharge Instructions: Apply over primary dressing as directed. Com pression Wrap: Kerlix Roll 4.5x3.1 (in/yd) 1 x Per Week/15 Days Discharge Instructions: Apply Kerlix and Coban compression as directed. Com pression Wrap: Coban Self-Adherent Wrap 4x5 (in/yd) 1 x Per Week/15 Days Discharge Instructions: Apply over Kerlix as directed. WOUND #8: - Lower Leg Wound Laterality: Left, Posterior Cleanser: Soap and Water 1 x Per Week/15 Days Discharge Instructions: May shower and wash wound with dial antibacterial soap and water prior to dressing change. Cleanser: Wound Cleanser 1 x Per Week/15 Days Discharge Instructions: Cleanse the wound with wound cleanser prior to applying a clean dressing using gauze sponges, not tissue or cotton balls. Peri-Wound Care: Triamcinolone 15 (g) 1 x Per Week/15 Days Discharge Instructions: Use triamcinolone 15 (g) as directed Peri-Wound Care: Zinc Oxide Ointment 30g tube 1 x  Per Week/15 Days Discharge  Instructions: Apply Zinc Oxide to periwound with each dressing change Peri-Wound Care: Sween Lotion (Moisturizing lotion) 1 x Per Week/15 Days Discharge Instructions: Apply moisturizing lotion as directed Prim Dressing: KerraCel Ag Gelling Fiber Dressing, 4x5 in (silver alginate) 1 x Per Week/15 Days ary Discharge Instructions: Apply silver alginate to wound bed as instructed Secondary Dressing: Woven Gauze Sponge, Non-Sterile 4x4 in 1 x Per Week/15 Days Discharge Instructions: Apply over primary dressing as directed. Secondary Dressing: ABD Pad, 5x9 1 x Per Week/15 Days Discharge Instructions: Apply over primary dressing as directed. Secondary Dressing: Zetuvit Plus 4x8 in 1 x Per Week/15 Days Discharge Instructions: Apply over primary dressing as directed. Com pression Wrap: Kerlix Roll 4.5x3.1 (in/yd) 1 x Per Week/15 Days Discharge Instructions: Apply Kerlix and Coban compression as directed. Com pression Wrap: Coban Self-Adherent Wrap 4x5 (in/yd) 1 x Per Week/15 Days Discharge Instructions: Apply over Kerlix as directed. WOUND #9: - Ankle Wound Laterality: Left, Lateral Cleanser: Soap and Water 1 x Per Week/15 Days Discharge Instructions: May shower and wash wound with dial antibacterial soap and water prior to dressing change. Cleanser: Wound Cleanser 1 x Per Week/15 Days Discharge Instructions: Cleanse the wound with wound cleanser prior to applying a clean dressing using gauze sponges, not tissue or cotton balls. Peri-Wound Care: Triamcinolone 15 (g) 1 x Per Week/15 Days Discharge Instructions: Use triamcinolone 15 (g) as directed Peri-Wound Care: Zinc Oxide Ointment 30g tube 1 x Per Week/15 Days Discharge Instructions: Apply Zinc Oxide to periwound with each dressing change Peri-Wound Care: Sween Lotion (Moisturizing lotion) 1 x Per Week/15 Days Discharge Instructions: Apply moisturizing lotion as directed Prim Dressing: KerraCel Ag Gelling Fiber  Dressing, 4x5 in (silver alginate) 1 x Per Week/15 Days ary Discharge Instructions: Apply silver alginate to wound bed as instructed Secondary Dressing: Woven Gauze Sponge, Non-Sterile 4x4 in 1 x Per Week/15 Days Discharge Instructions: Apply over primary dressing as directed. Secondary Dressing: ABD Pad, 5x9 1 x Per Week/15 Days Discharge Instructions: Apply over primary dressing as directed. Secondary Dressing: Zetuvit Plus 4x8 in 1 x Per Week/15 Days Discharge Instructions: Apply over primary dressing as directed. Com pression Wrap: Kerlix Roll 4.5x3.1 (in/yd) 1 x Per Week/15 Days Discharge Instructions: Apply Kerlix and Coban compression as directed. Com pression Wrap: Coban Self-Adherent Wrap 4x5 (in/yd) 1 x Per Week/15 Days Discharge Instructions: Apply over Kerlix as directed. 1. Silver alginate under Kerlix Coban wrap 2. Follow-up in 1 week 3. Patient was advised to call our clinic for a nurse visit if he needed throbs change sooner or had any questions or concerns. Electronic Signature(s) Signed: 09/25/2020 4:43:17 PM By: Kalman Shan DO Entered By: Kalman Shan on 09/25/2020 16:39:27 -------------------------------------------------------------------------------- HxROS Details Patient Name: Date of Service: [name], [place] 09/25/2020 2:45 PM Medical Record Number: [id number] Patient Account Number: [id number] Date of Birth/Sex: Treating RN: February 04, 1961 (59 y.o. Male) Rhae Hammock Primary Care Provider: [name] Other Clinician: Referring Provider: Treating Provider/Extender: [name] in Treatment: 0 Information Obtained From Patient Constitutional Symptoms (General Health) Complaints and Symptoms: Negative for: Fatigue; Fever; Chills; Marked Weight Change Eyes Complaints and Symptoms: Positive for: Glasses / Contacts Negative for: Dry Eyes; Vision Changes Medical History: Negative for: Cataracts; Glaucoma; Optic  Neuritis Ear/Nose/Mouth/Throat Complaints and Symptoms: Negative for: Chronic sinus problems or rhinitis Medical History: Negative for: Chronic sinus problems/congestion; Middle ear problems Respiratory Complaints and Symptoms: Negative for: Chronic or frequent coughs; Shortness of Breath Medical History: Negative for: Aspiration; Asthma; Chronic Obstructive Pulmonary Disease (COPD); Pneumothorax; Sleep Apnea;  Tuberculosis Cardiovascular Complaints and Symptoms: Negative for: Chest pain Medical History: Positive for: Congestive Heart Failure; Hypertension Negative for: Angina; Arrhythmia; Coronary Artery Disease; Deep Vein Thrombosis; Hypotension; Myocardial Infarction; Peripheral Arterial Disease; Peripheral Venous Disease; Phlebitis; Vasculitis Gastrointestinal Complaints and Symptoms: Negative for: Frequent diarrhea; Nausea; Vomiting Medical History: Negative for: Cirrhosis ; Colitis; Crohns; Hepatitis A; Hepatitis B; Hepatitis C Past Medical History Notes: GERD Endocrine Complaints and Symptoms: Positive for: Heat/cold intolerance Medical History: Positive for: Type II Diabetes Negative for: Type I Diabetes Time with diabetes: 2 years Treated with: Oral agents Blood sugar tested every day: Yes Tested : Genitourinary Complaints and Symptoms: Negative for: Frequent urination Integumentary (Skin) Complaints and Symptoms: Negative for: Wounds Medical History: Negative for: History of Burn Musculoskeletal Complaints and Symptoms: Negative for: Muscle Pain; Muscle Weakness Medical History: Positive for: Rheumatoid Arthritis Negative for: Gout; Osteoarthritis; Osteomyelitis Neurologic Complaints and Symptoms: Negative for: Numbness/parasthesias Medical History: Positive for: Neuropathy Negative for: Dementia; Quadriplegia; Paraplegia; Seizure Disorder Psychiatric Complaints and Symptoms: Negative for: Claustrophobia; Suicidal Medical History: Negative for:  Anorexia/bulimia; Confinement Anxiety Hematologic/Lymphatic Medical History: Negative for: Anemia; Hemophilia; Human Immunodeficiency Virus; Lymphedema; Sickle Cell Disease Immunological Medical History: Negative for: Lupus Erythematosus; Raynauds; Scleroderma Oncologic Medical History: Negative for: Received Chemotherapy; Received Radiation Immunizations Pneumococcal Vaccine: Received Pneumococcal Vaccination: No Implantable Devices None Hospitalization / Surgery History Type of Hospitalization/Surgery cataract extraction bilateral tonsillectomy Family and Social History Cancer: Yes - Father; Diabetes: Yes - Mother; Heart Disease: No; Hereditary Spherocytosis: No; Hypertension: Yes - Mother; Kidney Disease: No; Lung Disease: No; Seizures: No; Stroke: No; Thyroid Problems: No; Tuberculosis: No; Never smoker; Marital Status - Single; Alcohol Use: Never; Drug Use: No History; Caffeine Use: Never; Financial Concerns: No; Food, Clothing or Shelter Needs: No; Support System Lacking: No; Transportation Concerns: No Electronic Signature(s) Signed: 09/25/2020 4:43:17 PM By: Kalman Shan DO Signed: 09/25/2020 5:30:00 PM By: Rhae Hammock RN Entered By: Rhae Hammock on 09/25/2020 15:15:49 -------------------------------------------------------------------------------- SuperBill Details Patient Name: Date of Service: [name] [name], [place] 09/25/2020 Medical Record Number: [id number] Patient Account Number: [id number] Date of Birth/Sex: Treating RN: 11-Nov-1960 (60 y.o. Male) Lorrin Jackson Primary Care Provider: [name] Other Clinician: Referring Provider: Treating Provider/Extender: [name] in Treatment: 0 Diagnosis Coding ICD-10 Codes Code Description [id number] Non-pressure chronic ulcer of other part of right lower leg with unspecified severity L97.829 Non-pressure chronic ulcer of other part of left lower leg with unspecified severity E11.65  Type 2 diabetes mellitus with hyperglycemia I10 Essential (primary) hypertension N04.9 Nephrotic syndrome with unspecified morphologic changes Facility Procedures CPT4 Code: [id number] Description: [id number] - WOUND CARE VISIT-LEV 5 EST PT Modifier: Quantity: 1 Electronic Signature(s) Signed: 09/25/2020 4:43:17 PM By: Kalman Shan DO Signed: 09/25/2020 5:30:00 PM By: Rhae Hammock RN Entered By: Rhae Hammock on 09/25/2020 16:42:29

## 2020-09-29 NOTE — Progress Notes (Signed)
[name] [name], [name] [name] ([id number]) Visit Report for 09/25/2020 Allergy List Details Patient Name: Date of Service: [name] [name], [name] [name] 09/25/2020 2:45 PM Medical Record Number: [id number] Patient Account Number: [id number] Date of Birth/Sex: Treating RN: 06-25-1960 (60 y.o. Male) Rhae Hammock Primary Care Ladarien Beeks: [name] Other Clinician: Referring Meika Earll: Treating Delma Drone/Extender: [name] in Treatment: 0 Allergies Active Allergies No Known Allergies Allergy Notes Electronic Signature(s) Signed: 09/25/2020 5:30:00 PM By: Rhae Hammock RN Entered By: Rhae Hammock on 09/25/2020 15:01:51 -------------------------------------------------------------------------------- Arrival Information Details Patient Name: Date of Service: [name] [name], [name] [name] 09/25/2020 2:45 PM Medical Record Number: [id number] Patient Account Number: [id number] Date of Birth/Sex: Treating RN: Oct 16, 1960 (60 y.o. Male) Rhae Hammock Primary Care Marcheta Horsey: [name] Other Clinician: Referring Kennya Schwenn: Treating Kaliopi Blyden/Extender: [name] in Treatment: 0 Visit Information Patient Arrived: Wheel Chair Arrival Time: 15:03 Accompanied By: wife Transfer Assistance: None Patient Identification Verified: Yes Secondary Verification Process Completed: Yes Patient Requires Transmission-Based Precautions: No Patient Has Alerts: No Electronic Signature(s) Signed: 09/25/2020 5:30:00 PM By: Rhae Hammock RN Entered By: Rhae Hammock on 09/25/2020 15:04:12 -------------------------------------------------------------------------------- Clinic Level of Care Assessment Details Patient Name: Date of Service: [name] [name], [name] [name] 09/25/2020 2:45 PM Medical Record Number: [id number] Patient Account Number: [id number] Date of Birth/Sex: Treating RN: 05-Sep-1960 (60 y.o. Male) Rhae Hammock Primary Care Gabriell Casimir: [name] Other Clinician: Referring  Deloras Reichard: Treating Maddalyn Lutze/Extender: [name] in Treatment: 0 Clinic Level of Care Assessment Items TOOL 2 Quantity Score X- 1 0 Use when only an EandM is performed on the INITIAL visit ASSESSMENTS - Nursing Assessment / Reassessment X- 1 20 General Physical Exam (combine w/ comprehensive assessment (listed just below) when performed on new pt. evals) X- 1 25 Comprehensive Assessment (HX, ROS, Risk Assessments, Wounds Hx, etc.) ASSESSMENTS - Wound and Skin A ssessment / Reassessment '[]'$  - 0 Simple Wound Assessment / Reassessment - one wound X- 9 5 Complex Wound Assessment / Reassessment - multiple wounds X- 1 10 Dermatologic / Skin Assessment (not related to wound area) ASSESSMENTS - Ostomy and/or Continence Assessment and Care '[]'$  - 0 Incontinence Assessment and Management '[]'$  - 0 Ostomy Care Assessment and Management (repouching, etc.) PROCESS - Coordination of Care '[]'$  - 0 Simple Patient / Family Education for ongoing care X- 1 20 Complex (extensive) Patient / Family Education for ongoing care X- 1 10 Staff obtains Programmer, systems, Records, T Results / Process Orders est '[]'$  - 0 Staff telephones HHA, Nursing Homes / Clarify orders / etc '[]'$  - 0 Routine Transfer to another Facility (non-emergent condition) '[]'$  - 0 Routine Hospital Admission (non-emergent condition) X- 1 15 New Admissions / Biomedical engineer / Ordering NPWT Apligraf, etc. , '[]'$  - 0 Emergency Hospital Admission (emergent condition) '[]'$  - 0 Simple Discharge Coordination X- 1 15 Complex (extensive) Discharge Coordination PROCESS - Special Needs '[]'$  - 0 Pediatric / Minor Patient Management '[]'$  - 0 Isolation Patient Management '[]'$  - 0 Hearing / Language / Visual special needs '[]'$  - 0 Assessment of Community assistance (transportation, D/C planning, etc.) '[]'$  - 0 Additional assistance / Altered mentation '[]'$  - 0 Support Surface(s) Assessment (bed, cushion, seat,  etc.) INTERVENTIONS - Wound Cleansing / Measurement X- 1 5 Wound Imaging (photographs - any number of wounds) '[]'$  - 0 Wound Tracing (instead of photographs) '[]'$  - 0 Simple Wound Measurement - one wound X- 9 5 Complex Wound Measurement - multiple wounds '[]'$  - 0 Simple Wound Cleansing - one wound X- 9 5 Complex Wound Cleansing - multiple wounds INTERVENTIONS -  Wound Dressings '[]'$  - 0 Small Wound Dressing one or multiple wounds '[]'$  - 0 Medium Wound Dressing one or multiple wounds X- 9 20 Large Wound Dressing one or multiple wounds '[]'$  - 0 Application of Medications - injection INTERVENTIONS - Miscellaneous '[]'$  - 0 External ear exam '[]'$  - 0 Specimen Collection (cultures, biopsies, blood, body fluids, etc.) '[]'$  - 0 Specimen(s) / Culture(s) sent or taken to Lab for analysis '[]'$  - 0 Patient Transfer (multiple staff / Civil Service fast streamer / Similar devices) '[]'$  - 0 Simple Staple / Suture removal (25 or less) '[]'$  - 0 Complex Staple / Suture removal (26 or more) '[]'$  - 0 Hypo / Hyperglycemic Management (close monitor of Blood Glucose) '[]'$  - 0 Ankle / Brachial Index (ABI) - do not check if billed separately Has the patient been seen at the hospital within the last three years: Yes Total Score: 435 Level Of Care: New/Established - Level 5 Electronic Signature(s) Signed: 09/25/2020 5:30:00 PM By: Rhae Hammock RN Entered By: Rhae Hammock on 09/25/2020 16:42:10 -------------------------------------------------------------------------------- Encounter Discharge Information Details Patient Name: Date of Service: [name] [name], [name] [name] 09/25/2020 2:45 PM Medical Record Number: [id number] Patient Account Number: [id number] Date of Birth/Sex: Treating RN: 1961/01/29 (60 y.o. Male) Rhae Hammock Primary Care Ashwika Freels: [name] Other Clinician: Referring Makinna Andy: Treating Conny Situ/Extender: [name] in Treatment: 0 Encounter Discharge Information Items Discharge  Condition: Stable Ambulatory Status: Wheelchair Discharge Destination: Home Transportation: Private Auto Accompanied By: mom Schedule Follow-up Appointment: Yes Clinical Summary of Care: Patient Declined Electronic Signature(s) Signed: 09/25/2020 5:30:00 PM By: Rhae Hammock RN Entered By: Rhae Hammock on 09/25/2020 16:40:51 -------------------------------------------------------------------------------- Lower Extremity Assessment Details Patient Name: Date of Service: [name] [name], [name] [name] 09/25/2020 2:45 PM Medical Record Number: [id number] Patient Account Number: [id number] Date of Birth/Sex: Treating RN: Oct 27, 1960 (59 y.o. Male) Rhae Hammock Primary Care Talor Cheema: [name] Other Clinician: Referring Madissen Wyse: Treating Betta Balla/Extender: [name] in Treatment: 0 Edema Assessment Assessed: Shirlyn Goltz: Yes] [Right: Yes] Edema: [Left: Yes] [Right: Yes] Calf Left: Right: Point of Measurement: From Medial Instep 38 cm 37 cm Ankle Left: Right: Point of Measurement: From Medial Instep 26 cm 26 cm Vascular Assessment Pulses: Dorsalis Pedis Palpable: [Left:Yes] [Right:Yes] Posterior Tibial Palpable: [Left:Yes] [Right:Yes] Electronic Signature(s) Signed: 09/25/2020 5:30:00 PM By: Rhae Hammock RN Entered By: Rhae Hammock on 09/25/2020 15:21:42 -------------------------------------------------------------------------------- Multi Wound Chart Details Patient Name: Date of Service: [name] [name], [name] [name] 09/25/2020 2:45 PM Medical Record Number: [id number] Patient Account Number: [id number] Date of Birth/Sex: Treating RN: December 31, 1960 (59 y.o. Male) Lorrin Jackson Primary Care Danen Lapaglia: [name] Other Clinician: Referring Teige Rountree: Treating Kaelei Wheeler/Extender: [name] in Treatment: 0 Vital Signs Height(in): 70 Capillary Blood Glucose(mg/dl): 102 Weight(lbs): 148 Pulse(bpm): 78 Body Mass Index(BMI):  21 Blood Pressure(mmHg): 147/68 Temperature(F): 98.4 Respiratory Rate(breaths/min): 17 Photos: [1:No Photos Right, Dorsal Foot] [2:No Photos Right, Posterior Lower Leg] [3:No Photos Left, Proximal Lower Leg] Wound Location: [1:Blister] [2:Blister] [3:Blister] Wounding Event: [1:Diabetic Wound/Ulcer of the Lower] [2:Diabetic Wound/Ulcer of the Lower] [3:Diabetic Wound/Ulcer of the Lower] Primary Etiology: [1:Extremity Congestive Heart Failure,] [2:Extremity Congestive Heart Failure,] [3:Extremity Congestive Heart Failure,] Comorbid History: [1:Hypertension, Type II Diabetes, Rheumatoid Arthritis, Neuropathy 09/10/2020] [2:Hypertension, Type II Diabetes, Rheumatoid Arthritis, Neuropathy 09/10/2020] [3:Hypertension, Type II Diabetes, Rheumatoid Arthritis, Neuropathy 09/10/2020] Date Acquired: [1:0] [2:0] [3:0] Weeks of Treatment: [1:Open] [2:Open] [3:Open] Wound Status: [1:6x7.5x0.1] [2:8.5x15.5x0.1] [3:1.2x0.7x0.1] Measurements L x W x D (cm) [1:35.343] [2:103.476] [3:0.66] A (cm) : rea [1:3.534] [2:10.348] [3:0.066] Volume (cm) : [1:Grade 2] [2:Grade 2] [3:Grade 2]  Classification: [1:Large] [2:Large] [3:Medium] Exudate A mount: [1:Serosanguineous] [2:Serosanguineous] [3:Serosanguineous] Exudate Type: [1:red, Toral] [2:red, Vanorder] [3:red, Ritter] Exudate Color: [1:Distinct, outline attached] [2:Distinct, outline attached] [3:Distinct, outline attached] Wound Margin: [1:Large (67-100%)] [2:Large (67-100%)] [3:Large (67-100%)] Granulation A mount: [1:Red, Pink] [2:Red, Pink] [3:Red, Pink] Granulation Quality: [1:None Present (0%)] [2:None Present (0%)] [3:None Present (0%)] Necrotic A mount: [1:Fascia: No] [2:Fascia: No] [3:Fascia: No] Exposed Structures: [1:Fat Layer (Subcutaneous Tissue): No Tendon: No Muscle: No Joint: No Bone: No None] [2:Fat Layer (Subcutaneous Tissue): No Tendon: No Muscle: No Joint: No Bone: No None] [3:Fat Layer (Subcutaneous Tissue): No Tendon: No Muscle: No Joint:  No Bone: No  None] Wound Number: '4 5 6 '$ Photos: No Photos No Photos No Photos Left, Anterior Lower Leg Left, Dorsal Foot Left, Medial Ankle Wound Location: Blister Blister Blister Wounding Event: Diabetic Wound/Ulcer of the Lower Diabetic Wound/Ulcer of the Lower Diabetic Wound/Ulcer of the Lower Primary Etiology: Extremity Extremity Extremity Congestive Heart Failure, Congestive Heart Failure, Congestive Heart Failure, Comorbid History: Hypertension, Type II Diabetes, Hypertension, Type II Diabetes, Hypertension, Type II Diabetes, Rheumatoid Arthritis, Neuropathy Rheumatoid Arthritis, Neuropathy Rheumatoid Arthritis, Neuropathy 09/10/2020 09/10/2020 09/10/2020 Date Acquired: 0 0 0 Weeks of Treatment: Open Open Open Wound Status: 7.3x11x0.1 6x6.6x0.1 2.5x4.1x0.1 Measurements L x W x D (cm) 63.067 31.102 8.05 A (cm) : rea 6.307 3.11 0.805 Volume (cm) : Grade 2 Grade 2 Grade 2 Classification: Large Large Large Exudate A mount: Serosanguineous Serosanguineous Serosanguineous Exudate Type: red, [place] red, Prill red, Shuttleworth Exudate Color: Distinct, outline attached Distinct, outline attached Distinct, outline attached Wound Margin: Large (67-100%) Large (67-100%) Large (67-100%) Granulation A mount: Red, Pink Red, Pink Red, Pink Granulation Quality: None Present (0%) None Present (0%) None Present (0%) Necrotic A mount: Fascia: No Fascia: No Fascia: No Exposed Structures: Fat Layer (Subcutaneous Tissue): No Fat Layer (Subcutaneous Tissue): No Fat Layer (Subcutaneous Tissue): No Tendon: No Tendon: No Tendon: No Muscle: No Muscle: No Muscle: No Joint: No Joint: No Joint: No Bone: No Bone: No Bone: No None None None Epithelialization: Wound Number: '7 8 9 '$ Photos: No Photos No Photos No Photos Left, Dorsal T [place] oe Left, Posterior Lower Leg Left, Lateral Ankle Wound Location: Blister Blister Blister Wounding Event: Diabetic Wound/Ulcer of the Lower  Diabetic Wound/Ulcer of the Lower Diabetic Wound/Ulcer of the Lower Primary Etiology: Extremity Extremity Extremity Congestive Heart Failure, Congestive Heart Failure, Congestive Heart Failure, Comorbid History: Hypertension, Type II Diabetes, Hypertension, Type II Diabetes, Hypertension, Type II Diabetes, Rheumatoid Arthritis, Neuropathy Rheumatoid Arthritis, Neuropathy Rheumatoid Arthritis, Neuropathy 09/10/2020 09/10/2020 09/10/2020 Date Acquired: 0 0 0 Weeks of Treatment: Open Open Open Wound Status: 3x2.8x0.1 14.5x7x0.1 3x1.7x0.1 Measurements L x W x D (cm) 6.597 79.718 4.006 A (cm) : rea 0.66 7.972 0.401 Volume (cm) : Grade 2 Grade 2 Grade 2 Classification: Large Large Large Exudate A mount: Serosanguineous Serosanguineous Serosanguineous Exudate Type: red, Napierkowski red, Kirschenbaum red, Shirah Exudate Color: Distinct, outline attached Distinct, outline attached Distinct, outline attached Wound Margin: Large (67-100%) Large (67-100%) Large (67-100%) Granulation A mount: Red, Pink Red, Pink Red, Pink Granulation Quality: None Present (0%) None Present (0%) None Present (0%) Necrotic A mount: Fascia: No Fascia: No Fascia: No Exposed Structures: Fat Layer (Subcutaneous Tissue): No Fat Layer (Subcutaneous Tissue): No Fat Layer (Subcutaneous Tissue): No Tendon: No Tendon: No Tendon: No Muscle: No Muscle: No Muscle: No Joint: No Joint: No Joint: No Bone: No Bone: No Bone: No None None None Epithelialization: Treatment Notes Electronic Signature(s) Signed: 09/25/2020 4:43:17 PM By: Kalman Shan  DO Signed: 09/25/2020 5:21:53 PM By: Fara Chute By: Kalman Shan on 09/25/2020 16:25:55 -------------------------------------------------------------------------------- Multi-Disciplinary Care Plan Details Patient Name: Date of Service: [name] [name], [name] [name] 09/25/2020 2:45 PM Medical Record Number: [id number] Patient Account Number: [id number] Date of  Birth/Sex: Treating RN: 01-Jan-1961 (59 y.o. Male) Rhae Hammock Primary Care Nicholos Aloisi: [name] Other Clinician: Referring Hadli Vandemark: Treating Alister Staver/Extender: [name] in Treatment: 0 Active Inactive Orientation to the Wound Care Program Nursing Diagnoses: Knowledge deficit related to the wound healing center program Goals: Patient/caregiver will verbalize understanding of the [facility] Program Date Initiated: 09/25/2020 Target Resolution Date: 10/14/2020 Goal Status: Active Interventions: Provide education on orientation to the wound center Notes: Wound/Skin Impairment Nursing Diagnoses: Impaired tissue integrity Knowledge deficit related to ulceration/compromised skin integrity Goals: Patient will have a decrease in wound volume by X% from date: (specify in notes) Date Initiated: 09/25/2020 Target Resolution Date: 10/14/2020 Goal Status: Active Patient/caregiver will verbalize understanding of skin care regimen Date Initiated: 09/25/2020 Target Resolution Date: 10/17/2020 Goal Status: Active Ulcer/skin breakdown will have a volume reduction of 30% by week 4 Date Initiated: 09/25/2020 Target Resolution Date: 10/17/2020 Goal Status: Active Interventions: Assess patient/caregiver ability to obtain necessary supplies Assess patient/caregiver ability to perform ulcer/skin care regimen upon admission and as needed Assess ulceration(s) every visit Notes: Electronic Signature(s) Signed: 09/25/2020 5:30:00 PM By: Rhae Hammock RN Entered By: Rhae Hammock on 09/25/2020 16:00:52 -------------------------------------------------------------------------------- Pain Assessment Details Patient Name: Date of Service: [name] [name], [name] [name] 09/25/2020 2:45 PM Medical Record Number: [id number] Patient Account Number: [id number] Date of Birth/Sex: Treating RN: [birth year] (59 y.o. Male) Rhae Hammock Primary Care Taimane Stimmel: [name] Other Clinician: Referring Christina Gintz: Treating Ashad Fawbush/Extender: [name] in Treatment: 0 Active Problems Location of Pain Severity and Description of Pain Patient Has Paino Yes Site Locations Pain Location: Pain Location: Pain in Ulcers With Dressing Change: Yes Duration of the Pain. Constant / Intermittento Intermittent Rate the pain. Current Pain Level: 6 Worst Pain Level: 10 Least Pain Level: 0 Tolerable Pain Level: 6 Character of Pain Describe the Pain: Aching Pain Management and Medication Current Pain Management: Medication: Yes Cold Application: No Rest: Yes Massage: No Activity: No T.E.N.S.: No Heat Application: No Leg drop or elevation: No Is the Current Pain Management Adequate: Adequate How does your wound impact your activities of daily livingo Sleep: No Bathing: No Appetite: No Relationship With Others: No Bladder Continence: No Emotions: No Bowel Continence: No Work: No Toileting: No Drive: No Dressing: No Hobbies: No Electronic Signature(s) Signed: 09/25/2020 5:30:00 PM By: Rhae Hammock RN Entered By: Rhae Hammock on 09/25/2020 15:03:56 -------------------------------------------------------------------------------- Patient/Caregiver Education Details Patient Name: Date of Service: [name] [name], Hymie 4/15/2022andnbsp2:45 PM Medical Record Number: [id number] Patient Account Number: [id number] Date of Birth/Gender: Treating RN: Dec 08, 1960 (59 y.o. Male) Rhae Hammock Primary Care Physician: [name] Other Clinician: Referring Physician: Treating Physician/Extender: [name] in Treatment: 0 Education Assessment Education Provided To: Patient Education Topics Provided Welcome T [facility]: o Methods: Explain/Verbal Responses: State content correctly Electronic Signature(s) Signed: 09/25/2020 5:30:00 PM By: Rhae Hammock RN Entered By:  Rhae Hammock on 09/25/2020 16:01:01 -------------------------------------------------------------------------------- Wound Assessment Details Patient Name: Date of Service: [name] [name], [name] [name] 09/25/2020 2:45 PM Medical Record Number: [id number] Patient Account Number: [id number] Date of Birth/Sex: Treating RN: May 08, 1961 (59 y.o. Male) Rhae Hammock Primary Care Charisma Charlot: [name] Other Clinician: Referring Kirbie Stodghill: Treating Abagale Boulos/Extender: [name] in Treatment: 0 Wound Status Wound Number: 1 Primary Diabetic  Wound/Ulcer of the Lower Extremity Etiology: Wound Location: Right, Dorsal Foot Wound Open Wounding Event: Blister Status: Date Acquired: 09/10/2020 Comorbid Congestive Heart Failure, Hypertension, Type II Diabetes, Weeks Of Treatment: 0 History: Rheumatoid Arthritis, Neuropathy Clustered Wound: No Photos Wound Measurements Length: (cm) 6 Width: (cm) 7.5 Depth: (cm) 0.1 Area: (cm) 35.343 Volume: (cm) 3.534 % Reduction in Area: 0% % Reduction in Volume: 0% Epithelialization: None Tunneling: No Undermining: No Wound Description Classification: Grade 2 Wound Margin: Distinct, outline attached Exudate Amount: Large Exudate Type: Serosanguineous Exudate Color: red, Sheu Foul Odor After Cleansing: No Slough/Fibrino No Wound Bed Granulation Amount: Large (67-100%) Exposed Structure Granulation Quality: Red, Pink Fascia Exposed: No Necrotic Amount: None Present (0%) Fat Layer (Subcutaneous Tissue) Exposed: No Tendon Exposed: No Muscle Exposed: No Joint Exposed: No Bone Exposed: No Treatment Notes Wound #1 (Foot) Wound Laterality: Dorsal, Right Cleanser Soap and Water Discharge Instruction: May shower and wash wound with dial antibacterial soap and water prior to dressing change. Wound Cleanser Discharge Instruction: Cleanse the wound with wound cleanser prior to applying a clean dressing using gauze sponges, not  tissue or cotton balls. Peri-Wound Care Triamcinolone 15 (g) Discharge Instruction: Use triamcinolone 15 (g) as directed Zinc Oxide Ointment 30g tube Discharge Instruction: Apply Zinc Oxide to periwound with each dressing change Sween Lotion (Moisturizing lotion) Discharge Instruction: Apply moisturizing lotion as directed Topical Primary Dressing KerraCel Ag Gelling Fiber Dressing, 4x5 in (silver alginate) Discharge Instruction: Apply silver alginate to wound bed as instructed Secondary Dressing Woven Gauze Sponge, Non-Sterile 4x4 in Discharge Instruction: Apply over primary dressing as directed. ABD Pad, 5x9 Discharge Instruction: Apply over primary dressing as directed. Zetuvit Plus 4x8 in Discharge Instruction: Apply over primary dressing as directed. Secured With Compression Wrap Kerlix Roll 4.5x3.1 (in/yd) Discharge Instruction: Apply Kerlix and Coban compression as directed. Coban Self-Adherent Wrap 4x5 (in/yd) Discharge Instruction: Apply over Kerlix as directed. Compression Stockings Add-Ons Electronic Signature(s) Signed: 09/25/2020 5:30:00 PM By: Rhae Hammock RN Signed: 09/29/2020 8:07:32 AM By: Sandre Kitty Entered By: Sandre Kitty on 09/25/2020 16:59:36 -------------------------------------------------------------------------------- Wound Assessment Details Patient Name: Date of Service: [name] [name], [name] [name] 09/25/2020 2:45 PM Medical Record Number: [id number] Patient Account Number: [id number] Date of Birth/Sex: Treating RN: [birth year] (59 y.o. Male) Rhae Hammock Primary Care Kingjames Coury: [name] Other Clinician: Referring Nakhia Levitan: Treating Johnavon Mcclafferty/Extender: [name] in Treatment: 0 Wound Status Wound Number: 2 Primary Diabetic Wound/Ulcer of the Lower Extremity Etiology: Wound Location: Right, Posterior Lower Leg Wound Open Wounding Event: Blister Status: Date Acquired: 09/10/2020 Comorbid Congestive Heart  Failure, Hypertension, Type II Diabetes, Weeks Of Treatment: 0 History: Rheumatoid Arthritis, Neuropathy Clustered Wound: No Photos Wound Measurements Length: (cm) 8.5 Width: (cm) 15.5 Depth: (cm) 0.1 Area: (cm) 103.476 Volume: (cm) 10.348 % Reduction in Area: 0% % Reduction in Volume: 0% Epithelialization: None Tunneling: No Undermining: No Wound Description Classification: Grade 2 Wound Margin: Distinct, outline attached Exudate Amount: Large Exudate Type: Serosanguineous Exudate Color: red, [place] Foul Odor After Cleansing: No Slough/Fibrino No Wound Bed Granulation Amount: Large (67-100%) Exposed Structure Granulation Quality: Red, Pink Fascia Exposed: No Necrotic Amount: None Present (0%) Fat Layer (Subcutaneous Tissue) Exposed: No Tendon Exposed: No Muscle Exposed: No Joint Exposed: No Bone Exposed: No Treatment Notes Wound #2 (Lower Leg) Wound Laterality: Right, Posterior Cleanser Soap and Water Discharge Instruction: May shower and wash wound with dial antibacterial soap and water prior to dressing change. Wound Cleanser Discharge Instruction: Cleanse the wound with wound cleanser prior to applying a clean dressing using gauze sponges,  not tissue or cotton balls. Peri-Wound Care Triamcinolone 15 (g) Discharge Instruction: Use triamcinolone 15 (g) as directed Zinc Oxide Ointment 30g tube Discharge Instruction: Apply Zinc Oxide to periwound with each dressing change Sween Lotion (Moisturizing lotion) Discharge Instruction: Apply moisturizing lotion as directed Topical Primary Dressing KerraCel Ag Gelling Fiber Dressing, 4x5 in (silver alginate) Discharge Instruction: Apply silver alginate to wound bed as instructed Secondary Dressing Woven Gauze Sponge, Non-Sterile 4x4 in Discharge Instruction: Apply over primary dressing as directed. ABD Pad, 5x9 Discharge Instruction: Apply over primary dressing as directed. Zetuvit Plus 4x8 in Discharge Instruction:  Apply over primary dressing as directed. Secured With Compression Wrap Kerlix Roll 4.5x3.1 (in/yd) Discharge Instruction: Apply Kerlix and Coban compression as directed. Coban Self-Adherent Wrap 4x5 (in/yd) Discharge Instruction: Apply over Kerlix as directed. Compression Stockings Add-Ons Electronic Signature(s) Signed: 09/25/2020 5:30:00 PM By: Rhae Hammock RN Signed: 09/29/2020 8:07:32 AM By: Sandre Kitty Entered By: Sandre Kitty on 09/25/2020 16:59:52 -------------------------------------------------------------------------------- Wound Assessment Details Patient Name: Date of Service: [name] [name], [place] 09/25/2020 2:45 PM Medical Record Number: [id number] Patient Account Number: [id number] Date of Birth/Sex: Treating RN: [birth year] (59 y.o. Male) Rhae Hammock Primary Care Ileanna Gemmill: [name] Other Clinician: Referring Tevis Dunavan: Treating Malakie Balis/Extender: [name] in Treatment: 0 Wound Status Wound Number: 3 Primary Diabetic Wound/Ulcer of the Lower Extremity Etiology: Wound Location: Left, Proximal Lower Leg Wound Open Wounding Event: Blister Status: Date Acquired: 09/10/2020 Comorbid Congestive Heart Failure, Hypertension, Type II Diabetes, Weeks Of Treatment: 0 History: Rheumatoid Arthritis, Neuropathy Clustered Wound: No Photos Wound Measurements Length: (cm) 1.2 Width: (cm) 0.7 Depth: (cm) 0.1 Area: (cm) 0.66 Volume: (cm) 0.066 % Reduction in Area: 0% % Reduction in Volume: 0% Epithelialization: None Tunneling: No Undermining: No Wound Description Classification: Grade 2 Wound Margin: Distinct, outline attached Exudate Amount: Medium Exudate Type: Serosanguineous Exudate Color: red, [place] Foul Odor After Cleansing: No Slough/Fibrino No Wound Bed Granulation Amount: Large (67-100%) Exposed Structure Granulation Quality: Red, Pink Fascia Exposed: No Necrotic Amount: None Present (0%) Fat Layer  (Subcutaneous Tissue) Exposed: No Tendon Exposed: No Muscle Exposed: No Joint Exposed: No Bone Exposed: No Treatment Notes Wound #3 (Lower Leg) Wound Laterality: Left, Proximal Cleanser Soap and Water Discharge Instruction: May shower and wash wound with dial antibacterial soap and water prior to dressing change. Wound Cleanser Discharge Instruction: Cleanse the wound with wound cleanser prior to applying a clean dressing using gauze sponges, not tissue or cotton balls. Peri-Wound Care Triamcinolone 15 (g) Discharge Instruction: Use triamcinolone 15 (g) as directed Zinc Oxide Ointment 30g tube Discharge Instruction: Apply Zinc Oxide to periwound with each dressing change Sween Lotion (Moisturizing lotion) Discharge Instruction: Apply moisturizing lotion as directed Topical Primary Dressing KerraCel Ag Gelling Fiber Dressing, 4x5 in (silver alginate) Discharge Instruction: Apply silver alginate to wound bed as instructed Secondary Dressing Woven Gauze Sponge, Non-Sterile 4x4 in Discharge Instruction: Apply over primary dressing as directed. ABD Pad, 5x9 Discharge Instruction: Apply over primary dressing as directed. Zetuvit Plus 4x8 in Discharge Instruction: Apply over primary dressing as directed. Secured With Compression Wrap Kerlix Roll 4.5x3.1 (in/yd) Discharge Instruction: Apply Kerlix and Coban compression as directed. Coban Self-Adherent Wrap 4x5 (in/yd) Discharge Instruction: Apply over Kerlix as directed. Compression Stockings Add-Ons Electronic Signature(s) Signed: 09/25/2020 5:30:00 PM By: Rhae Hammock RN Signed: 09/29/2020 8:07:32 AM By: Sandre Kitty Entered By: Sandre Kitty on 09/25/2020 17:00:11 -------------------------------------------------------------------------------- Wound Assessment Details Patient Name: Date of Service: [name] [name], [place] 09/25/2020 2:45 PM Medical Record Number: [id number] Patient Account Number: [id number] Date of  Birth/Sex: Treating RN: [birth year] (60 y.o. Male) Rhae Hammock Primary Care Nykiah Ma: [name] Other Clinician: Referring Miaya Lafontant: Treating Rin Gorton/Extender: [name] in Treatment: 0 Wound Status Wound Number: 4 Primary Diabetic Wound/Ulcer of the Lower Extremity Etiology: Wound Location: Left, Anterior Lower Leg Wound Open Wounding Event: Blister Status: Date Acquired: 09/10/2020 Comorbid Congestive Heart Failure, Hypertension, Type II Diabetes, Weeks Of Treatment: 0 Weeks Of Treatment: 0 History: Rheumatoid Arthritis, Neuropathy Clustered Wound: No Photos Wound Measurements Length: (cm) 7.3 Width: (cm) 11 Depth: (cm) 0.1 Area: (cm) 63.067 Volume: (cm) 6.307 % Reduction in Area: 0% % Reduction in Volume: 0% Epithelialization: None Tunneling: No Undermining: No Wound Description Classification: Grade 2 Wound Margin: Distinct, outline attached Exudate Amount: Large Exudate Type: Serosanguineous Exudate Color: red, [place] Foul Odor After Cleansing: No Slough/Fibrino No Wound Bed Granulation Amount: Large (67-100%) Exposed Structure Granulation Quality: Red, Pink Fascia Exposed: No Necrotic Amount: None Present (0%) Fat Layer (Subcutaneous Tissue) Exposed: No Tendon Exposed: No Muscle Exposed: No Joint Exposed: No Bone Exposed: No Treatment Notes Wound #4 (Lower Leg) Wound Laterality: Left, Anterior Cleanser Soap and Water Discharge Instruction: May shower and wash wound with dial antibacterial soap and water prior to dressing change. Wound Cleanser Discharge Instruction: Cleanse the wound with wound cleanser prior to applying a clean dressing using gauze sponges, not tissue or cotton balls. Peri-Wound Care Triamcinolone 15 (g) Discharge Instruction: Use triamcinolone 15 (g) as directed Zinc Oxide Ointment 30g tube Discharge Instruction: Apply Zinc Oxide to periwound with each dressing change Sween Lotion  (Moisturizing lotion) Discharge Instruction: Apply moisturizing lotion as directed Topical Primary Dressing KerraCel Ag Gelling Fiber Dressing, 4x5 in (silver alginate) Discharge Instruction: Apply silver alginate to wound bed as instructed Secondary Dressing Woven Gauze Sponge, Non-Sterile 4x4 in Discharge Instruction: Apply over primary dressing as directed. ABD Pad, 5x9 Discharge Instruction: Apply over primary dressing as directed. Zetuvit Plus 4x8 in Discharge Instruction: Apply over primary dressing as directed. Secured With Compression Wrap Kerlix Roll 4.5x3.1 (in/yd) Discharge Instruction: Apply Kerlix and Coban compression as directed. Coban Self-Adherent Wrap 4x5 (in/yd) Discharge Instruction: Apply over Kerlix as directed. Compression Stockings Add-Ons Electronic Signature(s) Signed: 09/25/2020 5:30:00 PM By: Rhae Hammock RN Signed: 09/29/2020 8:07:32 AM By: Sandre Kitty Entered By: Sandre Kitty on 09/25/2020 17:00:41 -------------------------------------------------------------------------------- Wound Assessment Details Patient Name: Date of Service: [name] [name], [place] 09/25/2020 2:45 PM Medical Record Number: [id number] Patient Account Number: [id number] Date of Birth/Sex: Treating RN: [birth year] (59 y.o. Male) Rhae Hammock Primary Care Raidyn Breiner: [name] Other Clinician: Referring Quiana Cobaugh: Treating Gwenna Fuston/Extender: [name] in Treatment: 0 Wound Status Wound Number: 5 Primary Diabetic Wound/Ulcer of the Lower Extremity Etiology: Wound Location: Left, Dorsal Foot Wound Open Wounding Event: Blister Status: Date Acquired: 09/10/2020 Comorbid Congestive Heart Failure, Hypertension, Type II Diabetes, Weeks Of Treatment: 0 History: Rheumatoid Arthritis, Neuropathy Clustered Wound: No Photos Wound Measurements Length: (cm) 6 Width: (cm) 6.6 Depth: (cm) 0.1 Area: (cm) 31.102 Volume: (cm) 3.11 % Reduction  in Area: 0% % Reduction in Volume: 0% Epithelialization: None Tunneling: No Undermining: No Wound Description Classification: Grade 2 Wound Margin: Distinct, outline attached Exudate Amount: Large Exudate Type: Serosanguineous Exudate Color: red, [place] Foul Odor After Cleansing: No Slough/Fibrino No Wound Bed Granulation Amount: Large (67-100%) Exposed Structure Granulation Quality: Red, Pink Fascia Exposed: No Necrotic Amount: None Present (0%) Fat Layer (Subcutaneous Tissue) Exposed: No Tendon Exposed: No Muscle Exposed: No Joint Exposed: No Bone Exposed: No Treatment Notes Wound #5 (Foot) Wound Laterality: Dorsal, Left Cleanser  Soap and Water Discharge Instruction: May shower and wash wound with dial antibacterial soap and water prior to dressing change. Wound Cleanser Discharge Instruction: Cleanse the wound with wound cleanser prior to applying a clean dressing using gauze sponges, not tissue or cotton balls. Peri-Wound Care Triamcinolone 15 (g) Discharge Instruction: Use triamcinolone 15 (g) as directed Zinc Oxide Ointment 30g tube Discharge Instruction: Apply Zinc Oxide to periwound with each dressing change Sween Lotion (Moisturizing lotion) Discharge Instruction: Apply moisturizing lotion as directed Topical Primary Dressing KerraCel Ag Gelling Fiber Dressing, 4x5 in (silver alginate) Discharge Instruction: Apply silver alginate to wound bed as instructed Secondary Dressing Woven Gauze Sponge, Non-Sterile 4x4 in Discharge Instruction: Apply over primary dressing as directed. ABD Pad, 5x9 Discharge Instruction: Apply over primary dressing as directed. Zetuvit Plus 4x8 in Discharge Instruction: Apply over primary dressing as directed. Secured With Compression Wrap Kerlix Roll 4.5x3.1 (in/yd) Discharge Instruction: Apply Kerlix and Coban compression as directed. Coban Self-Adherent Wrap 4x5 (in/yd) Discharge Instruction: Apply over Kerlix as  directed. Compression Stockings Add-Ons Electronic Signature(s) Signed: 09/25/2020 5:30:00 PM By: Rhae Hammock RN Signed: 09/29/2020 8:07:32 AM By: Sandre Kitty Entered By: Sandre Kitty on 09/25/2020 17:01:03 -------------------------------------------------------------------------------- Wound Assessment Details Patient Name: Date of Service: [name] [name], [place] 09/25/2020 2:45 PM Medical Record Number: [id number] Patient Account Number: [id number] Date of Birth/Sex: Treating RN: May 03, 1961 (59 y.o. Male) Rhae Hammock Primary Care Nuri Larmer: [name] Other Clinician: Referring Adeline Petitfrere: Treating Nancey Kreitz/Extender: [name] in Treatment: 0 Wound Status Wound Number: 6 Primary Diabetic Wound/Ulcer of the Lower Extremity Etiology: Etiology: Wound Location: Left, Medial Ankle Wound Open Wounding Event: Blister Status: Date Acquired: 09/10/2020 Comorbid Congestive Heart Failure, Hypertension, Type II Diabetes, Weeks Of Treatment: 0 History: Rheumatoid Arthritis, Neuropathy Clustered Wound: No Photos Wound Measurements Length: (cm) 2.5 Width: (cm) 4.1 Depth: (cm) 0.1 Area: (cm) 8.05 Volume: (cm) 0.805 % Reduction in Area: 0% % Reduction in Volume: 0% Epithelialization: None Tunneling: No Undermining: No Wound Description Classification: Grade 2 Wound Margin: Distinct, outline attached Exudate Amount: Large Exudate Type: Serosanguineous Exudate Color: red, [place] Foul Odor After Cleansing: No Slough/Fibrino No Wound Bed Granulation Amount: Large (67-100%) Exposed Structure Granulation Quality: Red, Pink Fascia Exposed: No Necrotic Amount: None Present (0%) Fat Layer (Subcutaneous Tissue) Exposed: No Tendon Exposed: No Muscle Exposed: No Joint Exposed: No Bone Exposed: No Treatment Notes Wound #6 (Ankle) Wound Laterality: Left, Medial Cleanser Soap and Water Discharge Instruction: May shower and wash wound with  dial antibacterial soap and water prior to dressing change. Wound Cleanser Discharge Instruction: Cleanse the wound with wound cleanser prior to applying a clean dressing using gauze sponges, not tissue or cotton balls. Peri-Wound Care Triamcinolone 15 (g) Discharge Instruction: Use triamcinolone 15 (g) as directed Zinc Oxide Ointment 30g tube Discharge Instruction: Apply Zinc Oxide to periwound with each dressing change Sween Lotion (Moisturizing lotion) Discharge Instruction: Apply moisturizing lotion as directed Topical Primary Dressing KerraCel Ag Gelling Fiber Dressing, 4x5 in (silver alginate) Discharge Instruction: Apply silver alginate to wound bed as instructed Secondary Dressing Woven Gauze Sponge, Non-Sterile 4x4 in Discharge Instruction: Apply over primary dressing as directed. ABD Pad, 5x9 Discharge Instruction: Apply over primary dressing as directed. Zetuvit Plus 4x8 in Discharge Instruction: Apply over primary dressing as directed. Secured With Compression Wrap Kerlix Roll 4.5x3.1 (in/yd) Discharge Instruction: Apply Kerlix and Coban compression as directed. Coban Self-Adherent Wrap 4x5 (in/yd) Discharge Instruction: Apply over Kerlix as directed. Compression Stockings Add-Ons Electronic Signature(s) Signed: 09/25/2020 5:30:00 PM By: Rhae Hammock RN Signed:  09/29/2020 8:07:32 AM By: Sandre Kitty Entered By: Sandre Kitty on 09/25/2020 17:01:25 -------------------------------------------------------------------------------- Wound Assessment Details Patient Name: Date of Service: [name] [name], [place] 09/25/2020 2:45 PM Medical Record Number: [id number] Patient Account Number: [id number] Date of Birth/Sex: Treating RN: 1961-01-09 (59 y.o. Male) Rhae Hammock Primary Care Daniel Ritthaler: [name] Other Clinician: Referring Reshanda Lewey: Treating Sadat Sliwa/Extender: [name] in Treatment: 0 Wound Status Wound Number: 7 Primary  Diabetic Wound/Ulcer of the Lower Extremity Etiology: Wound Location: Left, Dorsal T Great oe Wound Open Wounding Event: Blister Status: Date Acquired: 09/10/2020 Comorbid Congestive Heart Failure, Hypertension, Type II Diabetes, Weeks Of Treatment: 0 History: Rheumatoid Arthritis, Neuropathy Clustered Wound: No Photos Wound Measurements Length: (cm) 3 Width: (cm) 2.8 Depth: (cm) 0.1 Area: (cm) 6.597 Volume: (cm) 0.66 % Reduction in Area: 0% % Reduction in Volume: 0% Epithelialization: None Tunneling: No Undermining: No Wound Description Classification: Grade 2 Wound Margin: Distinct, outline attached Exudate Amount: Large Exudate Type: Serosanguineous Exudate Color: red, [place] Foul Odor After Cleansing: No Slough/Fibrino No Wound Bed Granulation Amount: Large (67-100%) Exposed Structure Granulation Quality: Red, Pink Fascia Exposed: No Necrotic Amount: None Present (0%) Fat Layer (Subcutaneous Tissue) Exposed: No Tendon Exposed: No Muscle Exposed: No Joint Exposed: No Bone Exposed: No Treatment Notes Wound #7 (Toe Great) Wound Laterality: Dorsal, Left Cleanser Soap and Water Discharge Instruction: May shower and wash wound with dial antibacterial soap and water prior to dressing change. Wound Cleanser Discharge Instruction: Cleanse the wound with wound cleanser prior to applying a clean dressing using gauze sponges, not tissue or cotton balls. Peri-Wound Care Triamcinolone 15 (g) Discharge Instruction: Use triamcinolone 15 (g) as directed Zinc Oxide Ointment 30g tube Discharge Instruction: Apply Zinc Oxide to periwound with each dressing change Sween Lotion (Moisturizing lotion) Discharge Instruction: Apply moisturizing lotion as directed Topical Primary Dressing KerraCel Ag Gelling Fiber Dressing, 4x5 in (silver alginate) Discharge Instruction: Apply silver alginate to wound bed as instructed Secondary Dressing Woven Gauze Sponge, Non-Sterile 4x4  in Discharge Instruction: Apply over primary dressing as directed. ABD Pad, 5x9 Discharge Instruction: Apply over primary dressing as directed. Zetuvit Plus 4x8 in Discharge Instruction: Apply over primary dressing as directed. Secured With Compression Wrap Kerlix Roll 4.5x3.1 (in/yd) Discharge Instruction: Apply Kerlix and Coban compression as directed. Coban Self-Adherent Wrap 4x5 (in/yd) Discharge Instruction: Apply over Kerlix as directed. Compression Stockings Add-Ons Electronic Signature(s) Signed: 09/25/2020 5:30:00 PM By: Rhae Hammock RN Signed: 09/29/2020 8:07:32 AM By: Sandre Kitty Entered By: Sandre Kitty on 09/25/2020 17:01:43 -------------------------------------------------------------------------------- Wound Assessment Details Patient Name: Date of Service: [name] [name], [place] 09/25/2020 2:45 PM Medical Record Number: [id number] Patient Account Number: [id number] Date of Birth/Sex: Treating RN: 25-Jun-1960 (59 y.o. Male) Rhae Hammock Primary Care Demetreus Lothamer: [name] Other Clinician: Referring Mackenzie Lia: Treating Zarya Lasseigne/Extender: [name] in Treatment: 0 Wound Status Wound Number: 8 Primary Diabetic Wound/Ulcer of the Lower Extremity Etiology: Wound Location: Left, Posterior Lower Leg Wound Open Wounding Event: Blister Status: Date Acquired: 09/10/2020 Comorbid Congestive Heart Failure, Hypertension, Type II Diabetes, Weeks Of Treatment: 0 History: Rheumatoid Arthritis, Neuropathy Clustered Wound: No Wound Measurements Length: (cm) 14.5 Width: (cm) 7 Depth: (cm) 0.1 Area: (cm) 79.718 Volume: (cm) 7.972 % Reduction in Area: % Reduction in Volume: Epithelialization: None Tunneling: No Undermining: No Wound Description Classification: Grade 2 Wound Margin: Distinct, outline attached Exudate Amount: Large Exudate Type: Serosanguineous Exudate Color: red, [place] Foul Odor After Cleansing:  No Slough/Fibrino No Wound Bed Granulation Amount: Large (67-100%) Exposed Structure Granulation Quality: Red, Pink Fascia Exposed:  No Necrotic Amount: None Present (0%) Fat Layer (Subcutaneous Tissue) Exposed: No Tendon Exposed: No Muscle Exposed: No Joint Exposed: No Bone Exposed: No Treatment Notes Wound #8 (Lower Leg) Wound Laterality: Left, Posterior Cleanser Soap and Water Discharge Instruction: May shower and wash wound with dial antibacterial soap and water prior to dressing change. Wound Cleanser Discharge Instruction: Cleanse the wound with wound cleanser prior to applying a clean dressing using gauze sponges, not tissue or cotton balls. Peri-Wound Care Triamcinolone 15 (g) Discharge Instruction: Use triamcinolone 15 (g) as directed Zinc Oxide Ointment 30g tube Discharge Instruction: Apply Zinc Oxide to periwound with each dressing change Sween Lotion (Moisturizing lotion) Discharge Instruction: Apply moisturizing lotion as directed Topical Primary Dressing KerraCel Ag Gelling Fiber Dressing, 4x5 in (silver alginate) Discharge Instruction: Apply silver alginate to wound bed as instructed Secondary Dressing Woven Gauze Sponge, Non-Sterile 4x4 in Discharge Instruction: Apply over primary dressing as directed. ABD Pad, 5x9 Discharge Instruction: Apply over primary dressing as directed. Zetuvit Plus 4x8 in Discharge Instruction: Apply over primary dressing as directed. Secured With Compression Wrap Kerlix Roll 4.5x3.1 (in/yd) Discharge Instruction: Apply Kerlix and Coban compression as directed. Coban Self-Adherent Wrap 4x5 (in/yd) Discharge Instruction: Apply over Kerlix as directed. Compression Stockings Add-Ons Electronic Signature(s) Signed: 09/25/2020 5:30:00 PM By: Rhae Hammock RN Entered By: Rhae Hammock on 09/25/2020 15:31:23 -------------------------------------------------------------------------------- Wound Assessment Details Patient  Name: Date of Service: [name] [name], [place] 09/25/2020 2:45 PM Medical Record Number: [id number] Patient Account Number: [id number] Date of Birth/Sex: Treating RN: 1961/02/12 (59 y.o. Male) Rhae Hammock Primary Care Khamia Stambaugh: [name] Other Clinician: Referring Glenette Bookwalter: Treating Reade Trefz/Extender: [name] in Treatment: 0 Wound Status Wound Number: 9 Primary Diabetic Wound/Ulcer of the Lower Extremity Etiology: Wound Location: Left, Lateral Ankle Wound Open Wounding Event: Blister Status: Date Acquired: 09/10/2020 Comorbid Congestive Heart Failure, Hypertension, Type II Diabetes, Weeks Of Treatment: 0 History: Rheumatoid Arthritis, Neuropathy Clustered Wound: No Photos Wound Measurements Length: (cm) 3 Width: (cm) 1.7 Depth: (cm) 0.1 Area: (cm) 4.006 Volume: (cm) 0.401 % Reduction in Area: 0% % Reduction in Volume: 0% Epithelialization: None Tunneling: No Undermining: No Wound Description Classification: Grade 2 Wound Margin: Distinct, outline attached Exudate Amount: Large Exudate Type: Serosanguineous Exudate Color: red, [place] Foul Odor After Cleansing: No Slough/Fibrino No Wound Bed Granulation Amount: Large (67-100%) Exposed Structure Granulation Quality: Red, Pink Fascia Exposed: No Necrotic Amount: None Present (0%) Fat Layer (Subcutaneous Tissue) Exposed: No Tendon Exposed: No Muscle Exposed: No Joint Exposed: No Bone Exposed: No Treatment Notes Wound #9 (Ankle) Wound Laterality: Left, Lateral Cleanser Soap and Water Discharge Instruction: May shower and wash wound with dial antibacterial soap and water prior to dressing change. Wound Cleanser Discharge Instruction: Cleanse the wound with wound cleanser prior to applying a clean dressing using gauze sponges, not tissue or cotton balls. Peri-Wound Care Triamcinolone 15 (g) Discharge Instruction: Use triamcinolone 15 (g) as directed Zinc Oxide Ointment 30g  tube Discharge Instruction: Apply Zinc Oxide to periwound with each dressing change Sween Lotion (Moisturizing lotion) Discharge Instruction: Apply moisturizing lotion as directed Topical Primary Dressing KerraCel Ag Gelling Fiber Dressing, 4x5 in (silver alginate) Discharge Instruction: Apply silver alginate to wound bed as instructed Secondary Dressing Woven Gauze Sponge, Non-Sterile 4x4 in Discharge Instruction: Apply over primary dressing as directed. ABD Pad, 5x9 Discharge Instruction: Apply over primary dressing as directed. Zetuvit Plus 4x8 in Discharge Instruction: Apply over primary dressing as directed. Secured With Compression Wrap Kerlix Roll 4.5x3.1 (in/yd) Discharge Instruction: Apply Kerlix and Coban compression as  directed. Coban Self-Adherent Wrap 4x5 (in/yd) Discharge Instruction: Apply over Kerlix as directed. Compression Stockings Add-Ons Electronic Signature(s) Signed: 09/25/2020 5:30:00 PM By: Rhae Hammock RN Signed: 09/29/2020 8:07:32 AM By: Sandre Kitty Entered By: Sandre Kitty on 09/25/2020 17:02:20 -------------------------------------------------------------------------------- Vitals Details Patient Name: Date of Service: [id number] [place], [place] 09/25/2020 2:45 PM Medical Record Number: [id number] Patient Account Number: [id number] Date of Birth/Sex: Treating RN: June 25, 1960 (59 y.o. Male) Rhae Hammock Primary Care Andriy Sherk: [name] Other Clinician: Referring Taysha Majewski: Treating Dajah Fischman/Extender: [name] in Treatment: 0 Vital Signs Time Taken: 15:04 Temperature (F): 98.4 Height (in): 70 Pulse (bpm): 60 Source: Stated Respiratory Rate (breaths/min): 17 Weight (lbs): 148 Blood Pressure (mmHg): 147/68 Source: Stated Capillary Blood Glucose (mg/dl): 102 Body Mass Index (BMI): 21.2 Reference Range: 80 - 120 mg / dl Electronic Signature(s) Signed: 09/25/2020 5:30:00 PM By: Rhae Hammock  RN Entered By: Rhae Hammock on 09/25/2020 15:05:44

## 2020-10-02 ENCOUNTER — Other Ambulatory Visit: Payer: Self-pay

## 2020-10-02 ENCOUNTER — Encounter (HOSPITAL_BASED_OUTPATIENT_CLINIC_OR_DEPARTMENT_OTHER): Payer: Medicare HMO | Admitting: Internal Medicine

## 2020-10-02 DIAGNOSIS — Z79899 Other long term (current) drug therapy: Secondary | ICD-10-CM | POA: Diagnosis not present

## 2020-10-02 DIAGNOSIS — L97829 Non-pressure chronic ulcer of other part of left lower leg with unspecified severity: Secondary | ICD-10-CM

## 2020-10-02 DIAGNOSIS — L97819 Non-pressure chronic ulcer of other part of right lower leg with unspecified severity: Secondary | ICD-10-CM

## 2020-10-02 DIAGNOSIS — E1121 Type 2 diabetes mellitus with diabetic nephropathy: Secondary | ICD-10-CM | POA: Diagnosis not present

## 2020-10-02 DIAGNOSIS — E11622 Type 2 diabetes mellitus with other skin ulcer: Secondary | ICD-10-CM | POA: Diagnosis not present

## 2020-10-02 DIAGNOSIS — I10 Essential (primary) hypertension: Secondary | ICD-10-CM | POA: Diagnosis not present

## 2020-10-02 DIAGNOSIS — E11621 Type 2 diabetes mellitus with foot ulcer: Secondary | ICD-10-CM | POA: Diagnosis not present

## 2020-10-02 DIAGNOSIS — E1165 Type 2 diabetes mellitus with hyperglycemia: Secondary | ICD-10-CM | POA: Diagnosis not present

## 2020-10-02 DIAGNOSIS — L97519 Non-pressure chronic ulcer of other part of right foot with unspecified severity: Secondary | ICD-10-CM | POA: Diagnosis not present

## 2020-10-02 NOTE — Progress Notes (Signed)
[name], [name] ([id number]) Visit Report for 10/02/2020 Chief Complaint Document Details Patient Name: Date of Service: [name] [name], [name] [name] 10/02/2020 10:45 A M Medical Record Number: [id number] Patient Account Number: [id number] Date of Birth/Sex: Treating RN: 1961-04-29 (60 y.o. Marcheta Grammes Primary Care Provider: [name] Other Clinician: Referring Provider: Treating Provider/Extender: [name] in Treatment: 1 Information Obtained from: Patient Chief Complaint Bilateral lower extremity wounds Electronic Signature(s) Signed: 10/02/2020 12:38:10 PM By: Kalman Shan DO Entered By: Kalman Shan on 10/02/2020 12:29:12 -------------------------------------------------------------------------------- HPI Details Patient Name: Date of Service: [name] [name], [place] 10/02/2020 10:45 A M Medical Record Number: [id number] Patient Account Number: [id number] Date of Birth/Sex: Treating RN: 10-08-1960 (60 y.o. Marcheta Grammes Primary Care Provider: [name] Other Clinician: Referring Provider: Treating Provider/Extender: [name] in Treatment: 1 History of Present Illness HPI Description: Mr. [name] [name] is a 60 year old male with a past medical history of type 2 diabetes, nephrotic syndorme, essential hypertension that presents today with bilateral lower extremity wounds. 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 He is currently taking torsemide and metolazone. During his hospital stay he had compression wraps placed to his legs bilaterally. He was discharged on 3/31 and the wraps were removed about 1 week ago. He had blister formation throughout his legs and these have almost all opened. He has skin breakdown with serous drainage scattered throughout his legs bilaterally. He currently does not do anything for dressing changes. He states that for the  past week these have remained stable. He denies fever/chills or increased warmth or erythema to the lower extremities bilaterally. 4/22; patient presents for 1 week follow-up of bilateral lower extremity wounds. He had wraps placed in our clinic last week with silver alginate underneath. Today the wounds appear healing and some have closed. I would like to have a dressing done every day however the patient nor the patient's wife can do this. 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 Marcheta Grammes Primary Care Provider: [name] Other Clinician: Referring Provider: Treating Provider/Extender: [name] in Treatment: 1 Constitutional respirations regular, non-labored and within target range for patient.. Cardiovascular 2+ dorsalis pedis/posterior tibialis pulses. Psychiatric pleasant and cooperative. Notes Lower extremity bilaterally: There are scattered open wounds limited to skin breakdown. This is improved from last clinic visit. The wound bed is healthy and there are no signs of infection. There is now just 1 blister present on the right extremity. Electronic Signature(s) Signed: 10/02/2020 12:38:10 PM By: Kalman Shan DO Entered By: Kalman Shan on 10/02/2020 12:31:25 -------------------------------------------------------------------------------- Physician Orders Details Patient Name: Date of Service: BRO [name] [name], [place] 10/02/2020 10:45 A M Medical Record Number: [id number] Patient Account Number: [id number] Date of Birth/Sex:  Treating RN: 1961/04/30 (60 y.o. Marcheta Grammes Primary Care Provider: [name] Other Clinician: Referring Provider: Treating Provider/Extender: [name] in Treatment: 1 Verbal / Phone Orders: No Diagnosis Coding ICD-10 Coding Code Description L97.819 Non-pressure chronic ulcer of other part of right lower leg with unspecified severity L97.829 Non-pressure chronic ulcer of other part of left lower leg with unspecified severity E11.65 Type 2 diabetes mellitus with hyperglycemia N04.9  Nephrotic syndrome with unspecified morphologic changes I10 Essential (primary) hypertension Follow-up Appointments ppointment in 1 week. - Tuesday 4/26 Return A Bathing/ Shower/ Hygiene May shower with protection but do not get wound dressing(s) wet. - May use cast wraps from [facility] or [facility]. Edema Control - Lymphedema / SCD / Other Elevate legs to the level of the heart or above for 30 minutes daily and/or when sitting, a frequency of: Avoid standing for long periods of time. Additional Orders / Instructions Follow [place] [facility] to [facility] for wound care. May utilize formulary equivalent dressing for wound treatment orders unless otherwise specified. - A 10/02/20: Home Health Referrals Sent Out for Wound Care 2x week Wound Treatment Wound #2 - Lower Leg Wound Laterality: Right, Circumferential Cleanser: Soap and Water 2 x Per Day/15 Days Discharge Instructions: May shower and wash wound with dial antibacterial soap and water prior to dressing change. Cleanser: Wound Cleanser 2 x Per Day/15 Days Discharge Instructions: Cleanse the wound with wound cleanser prior to applying a clean dressing using gauze sponges, not tissue or cotton balls. Peri-Wound Care: Triamcinolone 15 (g) 2 x Per Day/15 Days Discharge Instructions: Use triamcinolone 15 (g) as directed Peri-Wound Care: Zinc Oxide Ointment 30g tube 2 x Per Day/15 Days Discharge  Instructions: Apply Zinc Oxide to periwound with each dressing change Peri-Wound Care: Sween Lotion (Moisturizing lotion) 2 x Per Day/15 Days Discharge Instructions: Apply moisturizing lotion as directed Prim Dressing: KerraCel Ag Gelling Fiber Dressing, 4x5 in (silver alginate) 2 x Per Day/15 Days ary Discharge Instructions: Apply silver alginate to wound bed as instructed Secondary Dressing: Woven Gauze Sponge, Non-Sterile 4x4 in 2 x Per Day/15 Days Discharge Instructions: Apply over primary dressing as directed. Secondary Dressing: ABD Pad, 5x9 2 x Per Day/15 Days Discharge Instructions: Apply over primary dressing as directed. Secondary Dressing: Zetuvit Plus 4x8 in 2 x Per Day/15 Days Discharge Instructions: Apply over primary dressing as directed. Compression Wrap: Kerlix Roll 4.5x3.1 (in/yd) 2 x Per [id number] Days Discharge Instructions: Apply Kerlix and Coban compression as directed. Compression Wrap: Coban Self-Adherent Wrap 4x5 (in/yd) 2 x Per Day/15 Days Discharge Instructions: Apply over Kerlix as directed. Wound #4 - Lower Leg Wound Laterality: Left, Circumferential Cleanser: Soap and Water 2 x Per [id number] Days Discharge Instructions: May shower and wash wound with dial antibacterial soap and water prior to dressing change. Cleanser: Wound Cleanser 2 x Per Day/15 Days Discharge Instructions: Cleanse the wound with wound cleanser prior to applying a clean dressing using gauze sponges, not tissue or cotton balls. Peri-Wound Care: Triamcinolone 15 (g) 2 x Per Day/15 Days Discharge Instructions: Use triamcinolone 15 (g) as directed Peri-Wound Care: Zinc Oxide Ointment 30g tube 2 x Per Day/15 Days Discharge Instructions: Apply Zinc Oxide to periwound with each dressing change Peri-Wound Care: Sween Lotion (Moisturizing lotion) 2 x Per Day/15 Days Discharge Instructions: Apply moisturizing lotion as directed Prim Dressing: KerraCel Ag Gelling Fiber Dressing, 4x5 in (silver alginate) 2 x  Per Day/15 Days ary Discharge Instructions: Apply silver alginate to wound bed as instructed Secondary Dressing: Woven Gauze Sponge, Non-Sterile 4x4 in 2 x Per Day/15 Days Discharge Instructions: Apply over primary dressing as directed. Secondary Dressing: ABD Pad, 5x9 2 x Per Day/15 Days Discharge Instructions: Apply over primary dressing as directed. Secondary Dressing: Zetuvit Plus 4x8 in 2 x Per Day/15 Days Discharge Instructions: Apply over primary dressing as directed. Compression Wrap: Kerlix Roll 4.5x3.1 (in/yd) 2 x Per [id number] Days Discharge Instructions: Apply Kerlix and Coban compression as  directed. Compression Wrap: Coban Self-Adherent Wrap 4x5 (in/yd) 2 x Per Day/15 Days Discharge Instructions: Apply over Kerlix as directed. Wound #5 - Foot Wound Laterality: Dorsal, Left Cleanser: Soap and Water 1 x Per Week/15 Days Discharge Instructions: May shower and wash wound with dial antibacterial soap and water prior to dressing change. Cleanser: Wound Cleanser 1 x Per Week/15 Days Discharge Instructions: Cleanse the wound with wound cleanser prior to applying a clean dressing using gauze sponges, not tissue or cotton balls. Peri-Wound Care: Vaseline 1 x Per Week/15 Days Prim Dressing: Xeroform Occlusive Gauze Dressing, 4x4 in ary 1 x Per Week/15 Days Discharge Instructions: Apply to wound bed as instructed Secondary Dressing: Woven Gauze Sponge, Non-Sterile 4x4 in 1 x Per Week/15 Days Discharge Instructions: Apply over primary dressing as directed. Secondary Dressing: ABD Pad, 5x9 1 x Per Week/15 Days Discharge Instructions: Apply over primary dressing as directed. Secondary Dressing: Zetuvit Plus 4x8 in 1 x Per Week/15 Days Discharge Instructions: Apply over primary dressing as directed. Compression Wrap: Kerlix Roll 4.5x3.1 (in/yd) 1 x Per Week/15 Days Discharge Instructions: Apply Kerlix and Coban compression as directed. Compression Wrap: Coban Self-Adherent Wrap 4x5 (in/yd) 1  x Per Week/15 Days Discharge Instructions: Apply over Kerlix as directed. Wound #7 - T Great oe Wound Laterality: Dorsal, Left Cleanser: Soap and Water 2 x Per Day/15 Days Discharge Instructions: May shower and wash wound with dial antibacterial soap and water prior to dressing change. Cleanser: Wound Cleanser 2 x Per Day/15 Days Discharge Instructions: Cleanse the wound with wound cleanser prior to applying a clean dressing using gauze sponges, not tissue or cotton balls. Peri-Wound Care: Vaseline 2 x Per Day/15 Days Prim Dressing: Xeroform Occlusive Gauze Dressing, 4x4 in 2 x Per Day/15 Days ary Discharge Instructions: Apply to wound bed as instructed Secondary Dressing: Woven Gauze Sponge, Non-Sterile 4x4 in 2 x Per Day/15 Days Discharge Instructions: Apply over primary dressing as directed. Secondary Dressing: ABD Pad, 5x9 2 x Per Day/15 Days Discharge Instructions: Apply over primary dressing as directed. Secondary Dressing: Zetuvit Plus 4x8 in 2 x Per Day/15 Days Discharge Instructions: Apply over primary dressing as directed. Compression Wrap: Kerlix Roll 4.5x3.1 (in/yd) 2 x Per [id number] Days Discharge Instructions: Apply Kerlix and Coban compression as directed. Compression Wrap: Coban Self-Adherent Wrap 4x5 (in/yd) 2 x Per Day/15 Days Discharge Instructions: Apply over Kerlix as directed. Electronic Signature(s) Signed: 10/02/2020 12:38:10 PM By: Kalman Shan DO Previous Signature: 10/02/2020 11:13:40 AM Version By: Lorrin Jackson Entered By: Kalman Shan on 10/02/2020 12:32:01 -------------------------------------------------------------------------------- Problem List Details Patient Name: Date of Service: [name] [name], [place] 10/02/2020 10:45 A M Medical Record Number: [id number] Patient Account Number: [id number] Date of Birth/Sex: Treating RN: 10-15-1960 (60 y.o. 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 with unspecified 09/25/2020 No Yes severity L97.829 Non-pressure chronic ulcer of other part of left lower leg with unspecified 09/25/2020 No Yes severity E11.65 Type 2 diabetes mellitus with hyperglycemia 09/25/2020 No Yes N04.9 Nephrotic syndrome with unspecified morphologic changes 09/25/2020 No Yes I10 Essential (primary) hypertension 09/25/2020 No Yes Inactive Problems Resolved Problems Electronic Signature(s) Signed: 10/02/2020 12:38:10 PM By: Kalman Shan DO Previous Signature: 10/02/2020 11:12:36 AM Version By: Lorrin Jackson Entered By: Kalman Shan on 10/02/2020 12:28:52 -------------------------------------------------------------------------------- Progress Note Details Patient Name: Date of Service: [id number] [place], [place] 10/02/2020  10:45 A M Medical Record Number: [id number] Patient Account Number: [id number] Date of Birth/Sex: Treating RN: September 28, 1960 (60 y.o. Marcheta Grammes Primary Care Provider: [name] Other Clinician: Referring Provider: Treating Provider/Extender: [name] in Treatment: 1 Subjective Chief Complaint Information obtained from Patient Bilateral lower extremity wounds History of Present Illness (HPI) Mr. Joushua Mccants is a 60 year old male with a past medical history of type 2 diabetes, nephrotic syndorme, essential hypertension that presents today with bilateral lower extremity wounds. 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 He is currently taking torsemide and metolazone. During his hospital stay he had compression wraps placed to his legs bilaterally. He was discharged on 3/31 and the wraps were removed about 1 week  ago. He had blister formation throughout his legs and these have almost all opened. He has skin breakdown with serous drainage scattered throughout his legs bilaterally. He currently does not do anything for dressing changes. He states that for the past week these have remained stable. He denies fever/chills or increased warmth or erythema to the lower extremities bilaterally. 4/22; patient presents for 1 week follow-up of bilateral lower extremity wounds. He had wraps placed in our clinic last week with silver alginate underneath. Today the wounds appear healing and some have closed. I would like to have a dressing done every day however the patient nor the patient's wife can do this. Overall patient states he is doing well Patient History Information obtained from Patient. Family History Cancer - Father, Diabetes - Mother, Hypertension - Mother, No family history of Heart Disease, Hereditary Spherocytosis, Kidney Disease, Lung Disease, Seizures, Stroke, Thyroid Problems, Tuberculosis. Social History Never smoker, Marital Status - Single, Alcohol Use - Never, Drug Use - No History, Caffeine Use - Never. Medical History Eyes Denies history of Cataracts, Glaucoma, Optic Neuritis Ear/Nose/Mouth/Throat Denies history of Chronic sinus problems/congestion, Middle ear problems Hematologic/Lymphatic Denies history of Anemia, Hemophilia, Human Immunodeficiency Virus, Lymphedema, Sickle Cell Disease Respiratory Denies history of Aspiration, Asthma, Chronic Obstructive Pulmonary Disease (COPD), Pneumothorax, Sleep Apnea, Tuberculosis Cardiovascular Patient has history of Congestive Heart Failure, Hypertension Denies history of Angina, Arrhythmia, Coronary Artery Disease, Deep Vein Thrombosis, Hypotension, Myocardial Infarction, Peripheral Arterial Disease, Peripheral Venous Disease, Phlebitis, Vasculitis Gastrointestinal Denies history of Cirrhosis , Colitis, Crohnoos, Hepatitis A, Hepatitis B,  Hepatitis C Endocrine Patient has history of Type II Diabetes Denies history of Type I Diabetes Immunological Denies history of Lupus Erythematosus, Raynaudoos, Scleroderma Integumentary (Skin) Denies history of History of Burn Musculoskeletal Patient has history of Rheumatoid Arthritis Denies history of Gout, Osteoarthritis, Osteomyelitis Neurologic Patient has history of Neuropathy Denies history of Dementia, Quadriplegia, Paraplegia, Seizure Disorder Oncologic Denies history of Received Chemotherapy, Received Radiation Psychiatric Denies history of Anorexia/bulimia, Confinement Anxiety Hospitalization/Surgery History - cataract extraction bilateral. - tonsillectomy. Medical A Surgical History Notes nd Gastrointestinal GERD Objective Constitutional respirations regular, non-labored and within target range for patient.. Vitals Time Taken: 10:50 AM, Height: 70 in, Weight: 148 lbs, BMI: 21.2, Temperature: 98.5 F, Pulse: 59 bpm, Respiratory Rate: 20 breaths/min, Blood Pressure: 147/69 mmHg, Capillary Blood Glucose: 114 mg/dl. Cardiovascular 2+ dorsalis pedis/posterior tibialis pulses. Psychiatric pleasant and cooperative. General Notes: Lower extremity bilaterally: There are scattered open wounds limited to skin breakdown. This is improved from last clinic visit. The wound bed is healthy and there are no signs of infection. There is now just 1 blister present on the right extremity. Integumentary (Hair, Skin) Wound #1 status is Healed -  Epithelialized. Original cause of wound was Blister. The date acquired was: 09/10/2020. The wound has been in treatment 1 weeks. The wound is located on the Right,Dorsal Foot. The wound measures 0cm length x 0cm width x 0cm depth; 0cm^2 area and 0cm^3 volume. Wound #2 status is Open. Original cause of wound was Blister. The date acquired was: 09/10/2020. The wound has been in treatment 1 weeks. The wound is located on the Right,Circumferential  Lower Leg. The wound measures 14cm length x 12.5cm width x 0.1cm depth; 137.445cm^2 area and 13.744cm^3 volume. There is Fat Layer (Subcutaneous Tissue) exposed. There is no tunneling or undermining noted. There is a large amount of purulent drainage noted. The wound margin is distinct with the outline attached to the wound base. There is large (67-100%) red, pink granulation within the wound bed. There is a small (1-33%) amount of necrotic tissue within the wound bed including Adherent Slough. Wound #3 status is Converted. Original cause of wound was Blister. The date acquired was: 09/10/2020. The wound has been in treatment 1 weeks. The wound is located on the Left,Proximal Lower Leg. The wound measures 15cm length x 17cm width x 0.1cm depth; 200.277cm^2 area and 20.028cm^3 volume. Wound #4 status is Open. Original cause of wound was Blister. The date acquired was: 09/10/2020. The wound has been in treatment 1 weeks. The wound is located on the Left,Circumferential Lower Leg. The wound measures 15cm length x 17cm width x 0.1cm depth; 200.277cm^2 area and 20.028cm^3 volume. There is Fat Layer (Subcutaneous Tissue) exposed. There is no tunneling or undermining noted. There is a large amount of purulent drainage noted. Foul odor after cleansing was noted. The wound margin is distinct with the outline attached to the wound base. There is large (67-100%) red, pink granulation within the wound bed. There is a small (1-33%) amount of necrotic tissue within the wound bed including Adherent Slough. General Notes: #3 and #8 converted to #4. Wound #5 status is Open. Original cause of wound was Blister. The date acquired was: 09/10/2020. The wound has been in treatment 1 weeks. The wound is located on the Left,Dorsal Foot. The wound measures 0.2cm length x 0.2cm width x 0.2cm depth; 0.031cm^2 area and 0.006cm^3 volume. There is no tunneling or undermining noted. There is a none present amount of drainage noted. The  wound margin is distinct with the outline attached to the wound base. There is medium (34-66%) pink granulation within the wound bed. There is a medium (34-66%) amount of necrotic tissue within the wound bed including Adherent Slough. Wound #6 status is Healed - Epithelialized. Original cause of wound was Blister. The date acquired was: 09/10/2020. The wound has been in treatment 1 weeks. The wound is located on the Left,Medial Ankle. The wound measures 0cm length x 0cm width x 0cm depth; 0cm^2 area and 0cm^3 volume. Wound #7 status is Open. Original cause of wound was Blister. The date acquired was: 09/10/2020. The wound has been in treatment 1 weeks. The wound is located on the Left,Dorsal T Great. The wound measures 0.1cm length x 0.1cm width x 0.1cm depth; 0.008cm^2 area and 0.001cm^3 volume. There is no oe tunneling or undermining noted. There is a none present amount of drainage noted. The wound margin is distinct with the outline attached to the wound base. There is no granulation within the wound bed. There is no necrotic tissue within the wound bed. General Notes: wound scabbed over. Wound #8 status is Converted. Original cause of wound was Blister. The date  acquired was: 09/10/2020. The wound has been in treatment 1 weeks. The wound is located on the Left,Posterior Lower Leg. The wound measures 15cm length x 17cm width x 0.1cm depth; 200.277cm^2 area and 20.028cm^3 volume. General Notes: converted to #4. Wound #9 status is Healed - Epithelialized. Original cause of wound was Blister. The date acquired was: 09/10/2020. The wound has been in treatment 1 weeks. The wound is located on the Left,Lateral Ankle. The wound measures 0cm length x 0cm width x 0cm depth; 0cm^2 area and 0cm^3 volume. 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 These were all blisters that have popped up and left the skin opened. Compared to last visit these are healing. I would like to continue the wraps with silver alginate since there is no one to do dressing changes daily. In the meantime I will see if home health can come out and do dressing changes at least twice a week. The top of his feet have dried scabs and I will do Vaseline and Xeroform to these areas. Follow-up next week Plan Follow-up Appointments: Return Appointment in 1 week. - Tuesday 4/26 Bathing/ Shower/ Hygiene: May shower with protection but do not get wound dressing(s) wet. - May use [facility] wraps [facility]. Edema Control - Lymphedema / SCD / Other: Elevate legs to the level of the heart or above for 30 minutes daily and/or when sitting, a frequency of: Avoid standing for long periods of time. Additional Orders / Instructions: Follow Nutritious Diet Home Health: Admit to Home Health for wound care. May utilize formulary equivalent dressing for wound treatment orders unless otherwise specified. - 10/02/20: Home Health Referrals Sent Out for Wound Care 2x week WOUND #2: - Lower Leg Wound Laterality: Right, Circumferential Cleanser: Soap and Water 2 x Per Day/15 Days Discharge Instructions: May shower and wash wound with dial antibacterial soap and water prior to dressing change. Cleanser: Wound Cleanser 2 x Per Day/15 Days Discharge Instructions: Cleanse the wound with wound cleanser prior to applying a clean dressing using gauze sponges, not tissue or cotton balls. Peri-Wound Care: Triamcinolone 15 (g) 2 x Per Day/15 Days Discharge Instructions: Use triamcinolone 15 (g) as directed Peri-Wound Care: Zinc Oxide Ointment 30g tube 2 x Per Day/15  Days Discharge Instructions: Apply Zinc Oxide to periwound with each dressing change Peri-Wound Care: Sween Lotion (Moisturizing lotion) 2 x Per Day/15 Days Discharge Instructions: Apply moisturizing lotion as directed Prim Dressing: KerraCel Ag Gelling Fiber Dressing, 4x5 in (silver alginate) 2 x Per Day/15 Days ary Discharge Instructions: Apply silver alginate to wound bed as instructed Secondary Dressing: Woven Gauze Sponge, Non-Sterile 4x4 in 2 x Per Day/15 Days Discharge Instructions: Apply over primary dressing as directed. Secondary Dressing: ABD Pad, 5x9 2 x Per Day/15 Days Discharge Instructions: Apply over primary dressing as directed. Secondary Dressing: Zetuvit Plus 4x8 in 2 x Per Day/15 Days Discharge Instructions: Apply over primary dressing as directed. Com pression Wrap: Kerlix Roll 4.5x3.1 (in/yd) 2 x Per Day/15 Days Discharge Instructions: Apply Kerlix and Coban compression as directed. Com pression Wrap: Coban Self-Adherent Wrap 4x5 (in/yd) 2 x Per Day/15 Days Discharge Instructions: Apply over Kerlix as directed. WOUND #4: - Lower Leg Wound Laterality: Left, Circumferential Cleanser:  Soap and Water 2 x Per [id number] Days Discharge Instructions: May shower and wash wound with dial antibacterial soap and water prior to dressing change. Cleanser: Wound Cleanser 2 x Per Day/15 Days Discharge Instructions: Cleanse the wound with wound cleanser prior to applying a clean dressing using gauze sponges, not tissue or cotton balls. Peri-Wound Care: Triamcinolone 15 (g) 2 x Per Day/15 Days Discharge Instructions: Use triamcinolone 15 (g) as directed Peri-Wound Care: Zinc Oxide Ointment 30g tube 2 x Per Day/15 Days Discharge Instructions: Apply Zinc Oxide to periwound with each dressing change Peri-Wound Care: Sween Lotion (Moisturizing lotion) 2 x Per Day/15 Days Discharge Instructions: Apply moisturizing lotion as directed Prim Dressing: KerraCel Ag Gelling Fiber Dressing, 4x5 in  (silver alginate) 2 x Per Day/15 Days ary Discharge Instructions: Apply silver alginate to wound bed as instructed Secondary Dressing: Woven Gauze Sponge, Non-Sterile 4x4 in 2 x Per Day/15 Days Discharge Instructions: Apply over primary dressing as directed. Secondary Dressing: ABD Pad, 5x9 2 x Per Day/15 Days Discharge Instructions: Apply over primary dressing as directed. Secondary Dressing: Zetuvit Plus 4x8 in 2 x Per Day/15 Days Discharge Instructions: Apply over primary dressing as directed. Com pression Wrap: Kerlix Roll 4.5x3.1 (in/yd) 2 x Per Day/15 Days Discharge Instructions: Apply Kerlix and Coban compression as directed. Com pression Wrap: Coban Self-Adherent Wrap 4x5 (in/yd) 2 x Per Day/15 Days Discharge Instructions: Apply over Kerlix as directed. WOUND #5: - Foot Wound Laterality: Dorsal, Left Cleanser: Soap and Water 1 x Per Week/15 Days Discharge Instructions: May shower and wash wound with dial antibacterial soap and water prior to dressing change. Cleanser: Wound Cleanser 1 x Per Week/15 Days Discharge Instructions: Cleanse the wound with wound cleanser prior to applying a clean dressing using gauze sponges, not tissue or cotton balls. Peri-Wound Care: Vaseline 1 x Per Week/15 Days Prim Dressing: Xeroform Occlusive Gauze Dressing, 4x4 in 1 x Per Week/15 Days ary Discharge Instructions: Apply to wound bed as instructed Secondary Dressing: Woven Gauze Sponge, Non-Sterile 4x4 in 1 x Per Week/15 Days Discharge Instructions: Apply over primary dressing as directed. Secondary Dressing: ABD Pad, 5x9 1 x Per Week/15 Days Discharge Instructions: Apply over primary dressing as directed. Secondary Dressing: Zetuvit Plus 4x8 in 1 x Per Week/15 Days Discharge Instructions: Apply over primary dressing as directed. Com pression Wrap: Kerlix Roll 4.5x3.1 (in/yd) 1 x Per Week/15 Days Discharge Instructions: Apply Kerlix and Coban compression as directed. Com pression Wrap: Coban  Self-Adherent Wrap 4x5 (in/yd) 1 x Per Week/15 Days Discharge Instructions: Apply over Kerlix as directed. WOUND #7: - T Great Wound Laterality: Dorsal, Left oe Cleanser: Soap and Water 2 x Per Day/15 Days Discharge Instructions: May shower and wash wound with dial antibacterial soap and water prior to dressing change. Cleanser: Wound Cleanser 2 x Per Day/15 Days Discharge Instructions: Cleanse the wound with wound cleanser prior to applying a clean dressing using gauze sponges, not tissue or cotton balls. Peri-Wound Care: Vaseline 2 x Per Day/15 Days Prim Dressing: Xeroform Occlusive Gauze Dressing, 4x4 in 2 x Per Day/15 Days ary Discharge Instructions: Apply to wound bed as instructed Secondary Dressing: Woven Gauze Sponge, Non-Sterile 4x4 in 2 x Per Day/15 Days Discharge Instructions: Apply over primary dressing as directed. Secondary Dressing: ABD Pad, 5x9 2 x Per Day/15 Days Discharge Instructions: Apply over primary dressing as directed. Secondary Dressing: Zetuvit Plus 4x8 in 2 x Per Day/15 Days Discharge Instructions: Apply over primary dressing as directed. Com pression Wrap: Kerlix Roll 4.5x3.1 (in/yd) 2 x Per Day/15  Days Discharge Instructions: Apply Kerlix and Coban compression as directed. Com pression Wrap: Coban Self-Adherent Wrap 4x5 (in/yd) 2 x Per Day/15 Days Discharge Instructions: Apply over Kerlix as directed. 1. Kerlix/Coban with silver alginate 2. Dorsal distal feet with Vaseline and Xeroform 3. Follow-up next week 4. Try to obtain home health twice weekly Electronic Signature(s) Signed: 10/02/2020 12:38:10 PM By: Kalman Shan DO Entered By: Kalman Shan on 10/02/2020 12:36:39 -------------------------------------------------------------------------------- HxROS Details Patient Name: Date of Service: BRO WN, [place] 10/02/2020 10:45 A M Medical Record Number: [id number] Patient Account Number: [id number] Date of Birth/Sex: Treating RN: 09-07-1960 (60  y.o. Marcheta Grammes Primary Care Provider: [name] Other Clinician: Referring Provider: Treating Provider/Extender: [name] in Treatment: 1 Information Obtained From Patient Eyes Medical History: Negative for: Cataracts; Glaucoma; Optic Neuritis Ear/Nose/Mouth/Throat Medical History: Negative for: Chronic sinus problems/congestion; Middle ear problems Hematologic/Lymphatic Medical History: Negative for: Anemia; Hemophilia; Human Immunodeficiency Virus; Lymphedema; Sickle Cell Disease Respiratory Medical History: Negative for: Aspiration; Asthma; Chronic Obstructive Pulmonary Disease (COPD); Pneumothorax; Sleep Apnea; Tuberculosis Cardiovascular Medical History: Positive for: Congestive Heart Failure; Hypertension Negative for: Angina; Arrhythmia; Coronary Artery Disease; Deep Vein Thrombosis; Hypotension; Myocardial Infarction; Peripheral Arterial Disease; Peripheral Venous Disease; Phlebitis; Vasculitis Gastrointestinal Medical History: Negative for: Cirrhosis ; Colitis; Crohns; Hepatitis A; Hepatitis B; Hepatitis C Past Medical History Notes: GERD Endocrine Medical History: Positive for: Type II Diabetes Negative for: Type I Diabetes Time with diabetes: 2 years Treated with: Oral agents Blood sugar tested every day: Yes Tested : Immunological Medical History: Negative for: Lupus Erythematosus; Raynauds; Scleroderma Integumentary (Skin) Medical History: Negative for: History of Burn Musculoskeletal Medical History: Positive for: Rheumatoid Arthritis Negative for: Gout; Osteoarthritis; Osteomyelitis Neurologic Medical History: Positive for: Neuropathy Negative for: Dementia; Quadriplegia; Paraplegia; Seizure Disorder Oncologic Medical History: Negative for: Received Chemotherapy; Received Radiation Psychiatric Medical History: Negative for: Anorexia/bulimia; Confinement Anxiety Immunizations Pneumococcal  Vaccine: Received Pneumococcal Vaccination: No Implantable Devices None Hospitalization / Surgery History Type of Hospitalization/Surgery cataract extraction bilateral tonsillectomy Family and Social History Cancer: Yes - Father; Diabetes: Yes - Mother; Heart Disease: No; Hereditary Spherocytosis: No; Hypertension: Yes - Mother; Kidney Disease: No; Lung Disease: No; Seizures: No; Stroke: No; Thyroid Problems: No; Tuberculosis: No; Never smoker; Marital Status - Single; Alcohol Use: Never; Drug Use: No History; Caffeine Use: Never; Financial Concerns: No; Food, Clothing or Shelter Needs: No; Support System Lacking: No; Transportation Concerns: No Electronic Signature(s) Signed: 10/02/2020 12:38:10 PM By: Kalman Shan DO Signed: 10/02/2020 5:01:44 PM By: Lorrin Jackson Entered By: Kalman Shan on 10/02/2020 12:30:30 -------------------------------------------------------------------------------- SuperBill Details Patient Name: Date of Service: [name] [name], [place] 10/02/2020 Medical Record Number: [id number] Patient Account Number: [id number] Date of Birth/Sex: Treating RN: 11-26-1960 (60 y.o. Marcheta Grammes Primary Care Provider: [name] Other Clinician: Referring Provider: Treating Provider/Extender: [name] in Treatment: 1 Diagnosis Coding ICD-10 Codes Code Description [phone] Non-pressure chronic ulcer of other part of right lower leg with unspecified severity L97.829 Non-pressure chronic ulcer of other part of left lower leg with unspecified severity E11.65 Type 2 diabetes mellitus with hyperglycemia N04.9 Nephrotic syndrome with unspecified morphologic changes I10 Essential (primary) hypertension Facility Procedures CPT4 Code: [id number] Description: [id number] - WOUND CARE VISIT-LEV 4 EST PT Modifier: Quantity: 1 Electronic Signature(s) Signed: 10/02/2020 12:38:10 PM By: Kalman Shan DO Previous Signature: 10/02/2020 12:11:33 PM  Version By: Lorrin Jackson Entered By: Kalman Shan on 10/02/2020 12:37:49

## 2020-10-06 ENCOUNTER — Encounter (HOSPITAL_BASED_OUTPATIENT_CLINIC_OR_DEPARTMENT_OTHER): Payer: Medicare HMO | Admitting: Internal Medicine

## 2020-10-06 ENCOUNTER — Other Ambulatory Visit: Payer: Self-pay

## 2020-10-06 DIAGNOSIS — L97819 Non-pressure chronic ulcer of other part of right lower leg with unspecified severity: Secondary | ICD-10-CM | POA: Diagnosis not present

## 2020-10-06 DIAGNOSIS — L97829 Non-pressure chronic ulcer of other part of left lower leg with unspecified severity: Secondary | ICD-10-CM | POA: Diagnosis not present

## 2020-10-06 DIAGNOSIS — L97519 Non-pressure chronic ulcer of other part of right foot with unspecified severity: Secondary | ICD-10-CM | POA: Diagnosis not present

## 2020-10-06 DIAGNOSIS — E1165 Type 2 diabetes mellitus with hyperglycemia: Secondary | ICD-10-CM | POA: Diagnosis not present

## 2020-10-06 DIAGNOSIS — I10 Essential (primary) hypertension: Secondary | ICD-10-CM | POA: Diagnosis not present

## 2020-10-06 DIAGNOSIS — E11621 Type 2 diabetes mellitus with foot ulcer: Secondary | ICD-10-CM | POA: Diagnosis not present

## 2020-10-06 DIAGNOSIS — Z79899 Other long term (current) drug therapy: Secondary | ICD-10-CM | POA: Diagnosis not present

## 2020-10-06 DIAGNOSIS — E1121 Type 2 diabetes mellitus with diabetic nephropathy: Secondary | ICD-10-CM | POA: Diagnosis not present

## 2020-10-06 DIAGNOSIS — E11622 Type 2 diabetes mellitus with other skin ulcer: Secondary | ICD-10-CM | POA: Diagnosis not present

## 2020-10-06 NOTE — Progress Notes (Signed)
[name] [name], [name] [name] ([id number]) Visit Report for 10/02/2020 Arrival Information Details Patient Name: Date of Service: [name] [name], [name] [name] 10/02/2020 10:45 A M Medical Record Number: [id number] Patient Account Number: [id number] Date of Birth/Sex: Treating RN: 1961-04-06 (60 y.o. [name] Primary Care Shelley Cocke: [name] Other Clinician: Referring Baylen Dea: Treating Virjean Boman/Extender: [name] in Treatment: 1 Visit Information History Since Last Visit Added or deleted any medications: No Patient Arrived: Wheel Chair Any new allergies or adverse reactions: No Arrival Time: 10:50 Had a fall or experienced change in No Accompanied By: wife activities of daily living that may affect Transfer Assistance: Manual risk of falls: Patient Identification Verified: Yes Signs or symptoms of abuse/neglect since last visito No Secondary Verification Process Completed: Yes Hospitalized since last visit: No Patient Requires Transmission-Based Precautions: No Implantable device outside of the clinic excluding No Patient Has Alerts: No cellular tissue based products placed in the center since last visit: Has Dressing in Place as Prescribed: Yes Has Compression in Place as Prescribed: Yes Pain Present Now: Yes Electronic Signature(s) Signed: 10/02/2020 5:09:10 PM By: Deon Pilling Entered By: Deon Pilling on 10/02/2020 11:02:22 -------------------------------------------------------------------------------- Clinic Level of Care Assessment Details Patient Name: Date of Service: [name] [name], [name] [name] 10/02/2020 10:45 A M Medical Record Number: [id number] Patient Account Number: [id number] Date of Birth/Sex: Treating RN: 31-Mar-1961 (60 y.o. Marcheta Grammes Primary Care Marti Mclane: [name] Other Clinician: Referring Jaeshawn Silvio: Treating Dawnelle Warman/Extender: [name] in Treatment: 1 Clinic Level of Care Assessment Items TOOL 4 Quantity  Score X- 1 0 Use when only an EandM is performed on FOLLOW-UP visit ASSESSMENTS - Nursing Assessment / Reassessment X- 1 10 Reassessment of Co-morbidities (includes updates in patient status) X- 1 5 Reassessment of Adherence to Treatment Plan ASSESSMENTS - Wound and Skin A ssessment / Reassessment '[]'$  - 0 Simple Wound Assessment / Reassessment - one wound X- 4 5 Complex Wound Assessment / Reassessment - multiple wounds '[]'$  - 0 Dermatologic / Skin Assessment (not related to wound area) ASSESSMENTS - Focused Assessment '[]'$  - 0 Circumferential Edema Measurements - multi extremities '[]'$  - 0 Nutritional Assessment / Counseling / Intervention '[]'$  - 0 Lower Extremity Assessment (monofilament, tuning fork, pulses) '[]'$  - 0 Peripheral Arterial Disease Assessment (using hand held doppler) ASSESSMENTS - Ostomy and/or Continence Assessment and Care '[]'$  - 0 Incontinence Assessment and Management '[]'$  - 0 Ostomy Care Assessment and Management (repouching, etc.) PROCESS - Coordination of Care '[]'$  - 0 Simple Patient / Family Education for ongoing care X- 1 20 Complex (extensive) Patient / Family Education for ongoing care '[]'$  - 0 Staff obtains Programmer, systems, Records, T Results / Process Orders est X- 1 10 Staff telephones HHA, Nursing Homes / Clarify orders / etc '[]'$  - 0 Routine Transfer to another Facility (non-emergent condition) '[]'$  - 0 Routine Hospital Admission (non-emergent condition) '[]'$  - 0 New Admissions / Biomedical engineer / Ordering NPWT Apligraf, etc. , '[]'$  - 0 Emergency Hospital Admission (emergent condition) '[]'$  - 0 Simple Discharge Coordination '[]'$  - 0 Complex (extensive) Discharge Coordination PROCESS - Special Needs '[]'$  - 0 Pediatric / Minor Patient Management '[]'$  - 0 Isolation Patient Management '[]'$  - 0 Hearing / Language / Visual special needs '[]'$  - 0 Assessment of Community assistance (transportation, D/C planning, etc.) '[]'$  - 0 Additional assistance / Altered  mentation '[]'$  - 0 Support Surface(s) Assessment (bed, cushion, seat, etc.) INTERVENTIONS - Wound Cleansing / Measurement '[]'$  - 0 Simple Wound Cleansing - one wound X- 4 5 Complex Wound Cleansing -  multiple wounds X- 1 5 Wound Imaging (photographs - any number of wounds) '[]'$  - 0 Wound Tracing (instead of photographs) '[]'$  - 0 Simple Wound Measurement - one wound X- 4 5 Complex Wound Measurement - multiple wounds INTERVENTIONS - Wound Dressings '[]'$  - 0 Small Wound Dressing one or multiple wounds '[]'$  - 0 Medium Wound Dressing one or multiple wounds X- 2 20 Large Wound Dressing one or multiple wounds '[]'$  - 0 Application of Medications - topical '[]'$  - 0 Application of Medications - injection INTERVENTIONS - Miscellaneous '[]'$  - 0 External ear exam '[]'$  - 0 Specimen Collection (cultures, biopsies, blood, body fluids, etc.) '[]'$  - 0 Specimen(s) / Culture(s) sent or taken to Lab for analysis '[]'$  - 0 Patient Transfer (multiple staff / Civil Service fast streamer / Similar devices) '[]'$  - 0 Simple Staple / Suture removal (25 or less) '[]'$  - 0 Complex Staple / Suture removal (26 or more) '[]'$  - 0 Hypo / Hyperglycemic Management (close monitor of Blood Glucose) '[]'$  - 0 Ankle / Brachial Index (ABI) - do not check if billed separately X- 1 5 Vital Signs Has the patient been seen at the hospital within the last three years: Yes Total Score: 155 Level Of Care: New/Established - Level 4 Electronic Signature(s) Signed: 10/02/2020 5:01:44 PM By: Lorrin Jackson Entered By: Lorrin Jackson on 10/02/2020 12:10:45 -------------------------------------------------------------------------------- Encounter Discharge Information Details Patient Name: Date of Service: [name] [name], [name] [name] 10/02/2020 10:45 A M Medical Record Number: [id number] Patient Account Number: [id number] Date of Birth/Sex: Treating RN: 1960-07-02 (60 y.o. [name] Primary Care Gerardine Peltz: [name] Other Clinician: Referring Boneta Standre: Treating  Jaylan Duggar/Extender: [name] in Treatment: 1 Encounter Discharge Information Items Discharge Condition: Stable Ambulatory Status: Wheelchair Discharge Destination: Home Transportation: Private Auto Accompanied By: wife Schedule Follow-up Appointment: Yes Clinical Summary of Care: Electronic Signature(s) Signed: 10/02/2020 5:09:10 PM By: Deon Pilling Entered By: Deon Pilling on 10/02/2020 13:20:48 -------------------------------------------------------------------------------- Lower Extremity Assessment Details Patient Name: Date of Service: [name] [name], [name] [name] 10/02/2020 10:45 A M Medical Record Number: [id number] Patient Account Number: [id number] Date of Birth/Sex: Treating RN: 1961/03/07 (60 y.o. [name] Primary Care Karalee Hauter: [name] Other Clinician: Referring Theodoro Koval: Treating Shruti Arrey/Extender: [name] in Treatment: 1 Edema Assessment Assessed: Shirlyn Goltz: Yes] Patrice Paradise: Yes] Edema: [Left: No] [Right: No] Calf Left: Right: Point of Measurement: From Medial Instep 29.8 cm 35 cm Ankle Left: Right: Point of Measurement: From Medial Instep 23.3 cm 24 cm Vascular Assessment Pulses: Dorsalis Pedis Palpable: [Left:Yes] [Right:Yes] Electronic Signature(s) Signed: 10/02/2020 5:09:10 PM By: Deon Pilling Entered By: Deon Pilling on 10/02/2020 11:03:20 -------------------------------------------------------------------------------- Multi Wound Chart Details Patient Name: Date of Service: [name] [name], [place] 10/02/2020 10:45 A M Medical Record Number: [id number] Patient Account Number: [id number] Date of Birth/Sex: Treating RN: Jul 23, 1960 (60 y.o. Marcheta Grammes Primary Care Jazlen Ogarro: [name] Other Clinician: Referring Eleftherios Dudenhoeffer: Treating Kostas Marrow/Extender: [name] in Treatment: 1 Vital Signs Height(in): 70 Capillary Blood Glucose(mg/dl): 114 Weight(lbs): 148 Pulse(bpm):  23 Body Mass Index(BMI): 21 Blood Pressure(mmHg): 147/69 Temperature(F): 98.5 Respiratory Rate(breaths/min): 20 Photos: [1:No Photos Right, Dorsal Foot] [2:No Photos Right, Circumferential Lower Leg] [3:No Photos Left, Proximal Lower Leg] Wound Location: [1:Blister] [2:Blister] [3:Blister] Wounding Event: [1:Diabetic Wound/Ulcer of the Lower] [2:Diabetic Wound/Ulcer of the Lower] [3:Diabetic Wound/Ulcer of the Lower] Primary Etiology: [1:Extremity N/A] [2:Extremity Congestive Heart Failure,] [3:Extremity N/A] Comorbid History: [1:09/10/2020] [2:Hypertension, Type II Diabetes, Rheumatoid Arthritis, Neuropathy 09/10/2020] [3:09/10/2020] Date Acquired: [1:1] [2:1] [3:1] Weeks of Treatment: [1:Healed - Epithelialized] [2:Open] [3:Converted]  Wound Status: [1:No] [2:Yes] [3:No] Clustered Wound: [1:N/A] [2:2] [3:N/A] Clustered Quantity: [1:0x0x0] [2:14x12.5x0.1] [3:15x17x0.1] Measurements L x W x D (cm) [1:0] [2:137.445] [3:200.277] A (cm) : rea [1:0] [2:13.744] [3:20.028] Volume (cm) : [1:100.00%] [2:-32.80%] [3:-30245.00%] % Reduction in A [1:rea: 100.00%] [2:-32.80%] [3:-30245.50%] % Reduction in Volume: [1:Grade 2] [2:Grade 2] [3:Grade 2] Classification: [1:N/A] [2:Large] [3:N/A] Exudate A mount: [1:N/A] [2:Purulent] [3:N/A] Exudate Type: [1:N/A] [2:yellow, Riggle, green] [3:N/A] Exudate Color: [1:N/A] [2:No] [3:N/A] Foul Odor A Cleansing: [1:fter N/A] [2:N/A] [3:N/A] Odor A nticipated Due to Product Use: [1:N/A] [2:Distinct, outline attached] [3:N/A] Wound Margin: [1:N/A] [2:Large (67-100%)] [3:N/A] Granulation A mount: [1:N/A] [2:Red, Pink] [3:N/A] Granulation Quality: [1:N/A] [2:Small (1-33%)] [3:N/A] Necrotic A mount: [1:N/A] [2:None] [3:N/A] Epithelialization: [1:N/A] [2:N/A] [3:N/A] Wound Number: '4 5 6 '$ Photos: No Photos No Photos No Photos Left, Circumferential Lower Leg Left, Dorsal Foot Left, Medial Ankle Wound Location: Blister Blister Blister Wounding Event: Diabetic  Wound/Ulcer of the Lower Diabetic Wound/Ulcer of the Lower Diabetic Wound/Ulcer of the Lower Primary Etiology: Extremity Extremity Extremity Congestive Heart Failure, Congestive Heart Failure, N/A Comorbid History: Hypertension, Type II Diabetes, Hypertension, Type II Diabetes, Rheumatoid Arthritis, Neuropathy Rheumatoid Arthritis, Neuropathy 09/10/2020 09/10/2020 09/10/2020 Date Acquired: '1 1 1 '$ Weeks of Treatment: Open Open Healed - Epithelialized Wound Status: No No No Clustered Wound: N/A N/A N/A Clustered Quantity: 15x17x0.1 0.2x0.2x0.2 0x0x0 Measurements L x W x D (cm) 200.277 0.031 0 A (cm) : rea 20.028 0.006 0 Volume (cm) : -217.60% 99.90% 100.00% % Reduction in A rea: -217.60% 99.80% 100.00% % Reduction in Volume: Grade 2 Grade 2 Grade 2 Classification: Large None Present N/A Exudate A mount: Purulent N/A N/A Exudate Type: yellow, Kluender, green N/A N/A Exudate Color: Yes No N/A Foul Odor A Cleansing: fter No N/A N/A Odor A nticipated Due to Product Use: Distinct, outline attached Distinct, outline attached N/A Wound Margin: Large (67-100%) Medium (34-66%) N/A Granulation A mount: Red, Pink Pink N/A Granulation Quality: Small (1-33%) Medium (34-66%) N/A Necrotic A mount: Fat Layer (Subcutaneous Tissue): Yes Fascia: No N/A Exposed Structures: Fascia: No Fat Layer (Subcutaneous Tissue): No Tendon: No Tendon: No Muscle: No Muscle: No Joint: No Joint: No Bone: No Bone: No None Large (67-100%) N/A Epithelialization: #3 and #8 converted to #4. N/A N/A Assessment Notes: Wound Number: '7 8 9 '$ Photos: No Photos No Photos No Photos Left, Dorsal T [place] oe Left, Posterior Lower Leg Left, Lateral Ankle Wound Location: Blister Blister Blister Wounding Event: Diabetic Wound/Ulcer of the Lower Diabetic Wound/Ulcer of the Lower Diabetic Wound/Ulcer of the Lower Primary Etiology: Extremity Extremity Extremity Congestive Heart Failure, Congestive Heart  Failure, N/A Comorbid History: Hypertension, Type II Diabetes, Hypertension, Type II Diabetes, Rheumatoid Arthritis, Neuropathy Rheumatoid Arthritis, Neuropathy 09/10/2020 09/10/2020 09/10/2020 Date Acquired: '1 1 1 '$ Weeks of Treatment: Open Converted Healed - Epithelialized Wound Status: No No No Clustered Wound: N/A N/A N/A Clustered Quantity: 0.1x0.1x0.1 15x17x0.1 0x0x0 Measurements L x W x D (cm) 0.008 200.277 0 A (cm) : rea 0.001 20.028 0 Volume (cm) : 99.90% -151.20% 100.00% % Reduction in A rea: 99.80% -151.20% 100.00% % Reduction in Volume: Grade 2 Grade 2 Grade 2 Classification: None Present N/A N/A Exudate A mount: N/A N/A N/A Exudate Type: N/A N/A N/A Exudate Color: No N/A N/A Foul Odor A Cleansing: fter N/A N/A N/A Odor A nticipated Due to Product Use: Distinct, outline attached N/A N/A Wound Margin: None Present (0%) N/A N/A Granulation A mount: N/A N/A N/A Granulation Quality: None Present (0%) N/A N/A Necrotic A mount:  Fascia: No N/A N/A Exposed Structures: Fat Layer (Subcutaneous Tissue): No Tendon: No Muscle: No Joint: No Bone: No Large (67-100%) N/A N/A Epithelialization: wound scabbed over. converted to #4. N/A Assessment Notes: Treatment Notes Electronic Signature(s) Signed: 10/02/2020 12:38:10 PM By: Kalman Shan DO Signed: 10/02/2020 5:01:44 PM By: Fara Chute By: Kalman Shan on 10/02/2020 12:28:59 -------------------------------------------------------------------------------- Multi-Disciplinary Care Plan Details Patient Name: Date of Service: [name] [name], [place] 10/02/2020 10:45 A M Medical Record Number: [id number] Patient Account Number: [id number] Date of Birth/Sex: Treating RN: [birth year] (60 y.o. Marcheta Grammes Primary Care Jazelyn Sipe: [name] Other Clinician: Referring Sandrina Heaton: Treating Alesi Zachery/Extender: [name] in Treatment: 1 Active Inactive Orientation to the  Wound Care Program Nursing Diagnoses: Knowledge deficit related to the wound healing center program Goals: Patient/caregiver will verbalize understanding of the [facility] Date Initiated: 09/25/2020 Target Resolution Date: 10/14/2020 Goal Status: Active Interventions: Provide education on orientation to the wound center Notes: Wound/Skin Impairment Nursing Diagnoses: Impaired tissue integrity Knowledge deficit related to ulceration/compromised skin integrity Goals: Patient will have a decrease in wound volume by X% from date: (specify in notes) Date Initiated: 09/25/2020 Target Resolution Date: 10/14/2020 Goal Status: Active Patient/caregiver will verbalize understanding of skin care regimen Date Initiated: 09/25/2020 Target Resolution Date: 10/17/2020 Goal Status: Active Ulcer/skin breakdown will have a volume reduction of 30% by week 4 Date Initiated: 09/25/2020 Target Resolution Date: 10/17/2020 Goal Status: Active Interventions: Assess patient/caregiver ability to obtain necessary supplies Assess patient/caregiver ability to perform ulcer/skin care regimen upon admission and as needed Assess ulceration(s) every visit Notes: Electronic Signature(s) Signed: 10/02/2020 11:13:56 AM By: Lorrin Jackson Entered By: Lorrin Jackson on 10/02/2020 11:13:55 -------------------------------------------------------------------------------- Pain Assessment Details Patient Name: Date of Service: DEVELL, BRINKERHOFF 10/02/2020 10:45 A M Medical Record Number: [id number] Patient Account Number: [id number] Date of Birth/Sex: Treating RN: 1960-06-19 (60 y.o. [name] Primary Care Bentzion Dauria: [name] Other Clinician: Referring Ludwin Flahive: Treating Lilianah Buffin/Extender: [name] in Treatment: 1 Active Problems Location of Pain Severity and Description of Pain Patient Has Paino Yes Site Locations Pain Location: Generalized Pain, Pain in  Ulcers Rate the pain. Current Pain Level: 5 Worst Pain Level: 10 Least Pain Level: 0 Tolerable Pain Level: 8 Pain Management and Medication Current Pain Management: Medication: No Cold Application: No Rest: No Massage: No Activity: No T.E.N.S.: No Heat Application: No Leg drop or elevation: No Is the Current Pain Management Adequate: Adequate How does your wound impact your activities of daily livingo Sleep: No Bathing: No Appetite: No Relationship With Others: No Bladder Continence: No Emotions: No Bowel Continence: No Work: No Toileting: No Drive: No Dressing: No Hobbies: No Electronic Signature(s) Signed: 10/02/2020 5:09:10 PM By: Deon Pilling Entered By: Deon Pilling on 10/02/2020 11:02:51 -------------------------------------------------------------------------------- Patient/Caregiver Education Details Patient Name: Date of Service: [name] [name], [place] 4/22/2022andnbsp10:45 A M Medical Record Number: [id number] Patient Account Number: [id number] Date of Birth/Gender: Treating RN: 07-26-1960 (60 y.o. Marcheta Grammes Primary Care Physician: [name] Other Clinician: Referring Physician: Treating Physician/Extender: [name] in Treatment: 1 Education Assessment Education Provided To: Patient Education Topics Provided Venous: Methods: Explain/Verbal, Printed Responses: State content correctly Wound/Skin Impairment: Methods: Explain/Verbal, Printed Responses: State content correctly Electronic Signature(s) Signed: 10/02/2020 5:01:44 PM By: Lorrin Jackson Entered By: Lorrin Jackson on 10/02/2020 11:14:37 -------------------------------------------------------------------------------- Wound Assessment Details Patient Name: Date of Service: [name] [name], [place] 10/02/2020 10:45 A M Medical Record Number: [id number] Patient Account Number: [id number] Date of Birth/Sex: Treating RN: 1961/03/31 (60 y.o.  Lorette Ang, Meta.Reding Primary  Care Rocio Wolak: [name] Other Clinician: Referring Jakeb Lamping: Treating Vauda Salvucci/Extender: [name] in Treatment: 1 Wound Status Wound Number: 1 Primary Etiology: Diabetic Wound/Ulcer of the Lower Extremity Wound Location: Right, Dorsal Foot Wound Status: Healed - Epithelialized Wounding Event: Blister Date Acquired: 09/10/2020 Weeks Of Treatment: 1 Clustered Wound: No Wound Measurements Length: (cm) Width: (cm) Depth: (cm) Area: (cm) Volume: (cm) 0 % Reduction in Area: 100% 0 % Reduction in Volume: 100% 0 0 0 Wound Description Classification: Grade 2 Treatment Notes Wound #1 (Foot) Wound Laterality: Dorsal, Right Cleanser Peri-Wound Care Topical Primary Dressing Secondary Dressing Secured With Compression Wrap Compression Stockings Add-Ons Electronic Signature(s) Signed: 10/02/2020 5:09:10 PM By: Deon Pilling Entered By: Deon Pilling on 10/02/2020 11:03:51 -------------------------------------------------------------------------------- Wound Assessment Details Patient Name: Date of Service: [name] [name], [name] [name] 10/02/2020 10:45 A M Medical Record Number: [id number] Patient Account Number: [id number] Date of Birth/Sex: Treating RN: 03-May-1961 (61 y.o. Lorette Ang, Meta.Reding Primary Care [name] Swanner: [name] Other Clinician: Referring Anela Bensman: Treating Thelbert Gartin/Extender: [name] in Treatment: 1 Wound Status Wound Number: 2 Primary Diabetic Wound/Ulcer of the Lower Extremity Etiology: Wound Location: Right, Circumferential Lower Leg Wound Open Wounding Event: Blister Status: Date Acquired: 09/10/2020 Comorbid Congestive Heart Failure, Hypertension, Type II Diabetes, Weeks Of Treatment: 1 History: Rheumatoid Arthritis, Neuropathy Clustered Wound: Yes Photos Wound Measurements Length: (cm) 14 Width: (cm) 12.5 Depth: (cm) 0.1 Clustered Quantity: 2 Area: (cm) 137.445 Volume: (cm) 13.744 %  Reduction in Area: -32.8% % Reduction in Volume: -32.8% Epithelialization: None Tunneling: No Undermining: No Wound Description Classification: Grade 2 Wound Margin: Distinct, outline attached Exudate Amount: Large Exudate Type: Purulent Exudate Color: yellow, [place], green Foul Odor After Cleansing: No Slough/Fibrino Yes Wound Bed Granulation Amount: Large (67-100%) Exposed Structure Granulation Quality: Red, Pink Fascia Exposed: No Necrotic Amount: Small (1-33%) Fat Layer (Subcutaneous Tissue) Exposed: Yes Necrotic Quality: Adherent Slough Tendon Exposed: No Muscle Exposed: No Joint Exposed: No Bone Exposed: No Electronic Signature(s) Signed: 10/02/2020 5:09:10 PM By: Deon Pilling Signed: 10/06/2020 2:09:33 PM By: Sandre Kitty Entered By: Sandre Kitty on 10/02/2020 16:09:08 -------------------------------------------------------------------------------- Wound Assessment Details Patient Name: Date of Service: [name] [name], [name] [name] 10/02/2020 10:45 A M Medical Record Number: [id number] Patient Account Number: [id number] Date of Birth/Sex: Treating RN: [birth year] (60 y.o. [name] Primary Care Orabelle Rylee: [name] Other Clinician: Referring Afomia Blackley: Treating Clent Damore/Extender: [name] in Treatment: 1 Wound Status Wound Number: 3 Primary Etiology: Diabetic Wound/Ulcer of the Lower Extremity Wound Location: Left, Proximal Lower Leg Wound Status: Converted Wounding Event: Blister Date Acquired: 09/10/2020 Weeks Of Treatment: 1 Clustered Wound: No Wound Measurements Length: (cm) 15 Width: (cm) 17 Depth: (cm) 0.1 Area: (cm) 200.277 Volume: (cm) 20.028 % Reduction in Area: -30245% % Reduction in Volume: -30245.5% Wound Description Classification: Grade 2 Treatment Notes Wound #3 (Lower Leg) Wound Laterality: Left, Proximal Cleanser Peri-Wound Care Topical Primary Dressing Secondary Dressing Secured With Compression  Wrap Compression Stockings Add-Ons Electronic Signature(s) Signed: 10/02/2020 5:09:10 PM By: Deon Pilling Entered By: Deon Pilling on 10/02/2020 11:06:26 -------------------------------------------------------------------------------- Wound Assessment Details Patient Name: Date of Service: [name] [name], [name] [name] 10/02/2020 10:45 A M Medical Record Number: [id number] Patient Account Number: [id number] Date of Birth/Sex: Treating RN: 1961/04/03 (60 y.o. [name] Primary Care Mattix Imhof: [name] Other Clinician: Referring Calani Gick: Treating Shivaay Stormont/Extender: [name] in Treatment: 1 Wound Status Wound Number: 4 Primary Diabetic Wound/Ulcer of the Lower Extremity Etiology: Wound Location: Left, Circumferential Lower Leg Wound Open  Wounding Event: Blister Status: Date Acquired: 09/10/2020 Comorbid Congestive Heart Failure, Hypertension, Type II Diabetes, Weeks Of Treatment: 1 History: Rheumatoid Arthritis, Neuropathy Clustered Wound: No Photos Wound Measurements Length: (cm) 15 Width: (cm) 17 Depth: (cm) 0.1 Area: (cm) 200.277 Volume: (cm) 20.028 % Reduction in Area: -217.6% % Reduction in Volume: -217.6% Epithelialization: None Tunneling: No Undermining: No Wound Description Classification: Grade 2 Wound Margin: Distinct, outline attached Exudate Amount: Large Exudate Type: Purulent Exudate Color: yellow, Payment, green Foul Odor After Cleansing: Yes Due to Product Use: No Slough/Fibrino Yes Wound Bed Granulation Amount: Large (67-100%) Exposed Structure Granulation Quality: Red, Pink Fascia Exposed: No Necrotic Amount: Small (1-33%) Fat Layer (Subcutaneous Tissue) Exposed: Yes Necrotic Quality: Adherent Slough Tendon Exposed: No Muscle Exposed: No Joint Exposed: No Bone Exposed: No Assessment Notes #3 and #8 converted to #4. Electronic Signature(s) Signed: 10/02/2020 5:09:10 PM By: Deon Pilling Signed: 10/06/2020 2:09:33  PM By: Sandre Kitty Entered By: Sandre Kitty on 10/02/2020 16:08:47 -------------------------------------------------------------------------------- Wound Assessment Details Patient Name: Date of Service: [name] [name], [place] 10/02/2020 10:45 A M Medical Record Number: [id number] Patient Account Number: [id number] Date of Birth/Sex: Treating RN: 01-26-1961 (61 y.o. Lorette Ang, Meta.Reding Primary Care Deeric Cruise: [name] Other Clinician: Referring Fujie Dickison: Treating Jennalee Greaves/Extender: [name] in Treatment: 1 Wound Status Wound Number: 5 Primary Diabetic Wound/Ulcer of the Lower Extremity Etiology: Wound Location: Left, Dorsal Foot Wound Open Wounding Event: Blister Status: Date Acquired: 09/10/2020 Comorbid Congestive Heart Failure, Hypertension, Type II Diabetes, Weeks Of Treatment: 1 History: Rheumatoid Arthritis, Neuropathy Clustered Wound: No Photos Wound Measurements Length: (cm) 0.2 Width: (cm) 0.2 Depth: (cm) 0.2 Area: (cm) 0.031 Volume: (cm) 0.006 % Reduction in Area: 99.9% % Reduction in Volume: 99.8% Epithelialization: Large (67-100%) Tunneling: No Undermining: No Wound Description Classification: Grade 2 Wound Margin: Distinct, outline attached Exudate Amount: None Present Foul Odor After Cleansing: No Slough/Fibrino No Wound Bed Granulation Amount: Medium (34-66%) Exposed Structure Granulation Quality: Pink Fascia Exposed: No Necrotic Amount: Medium (34-66%) Fat Layer (Subcutaneous Tissue) Exposed: No Necrotic Quality: Adherent Slough Tendon Exposed: No Muscle Exposed: No Joint Exposed: No Bone Exposed: No Electronic Signature(s) Signed: 10/02/2020 5:09:10 PM By: Deon Pilling Signed: 10/06/2020 2:09:33 PM By: Sandre Kitty Entered By: Sandre Kitty on 10/02/2020 16:08:25 -------------------------------------------------------------------------------- Wound Assessment Details Patient Name: Date of  Service: [name] [name], [place] 10/02/2020 10:45 A M Medical Record Number: [id number] Patient Account Number: [id number] Date of Birth/Sex: Treating RN: July 25, 1960 (60 y.o. Lorette Ang, Meta.Reding Primary Care Kaydee Magel: [name] Other Clinician: Referring Keltin Baird: Treating Gina Leblond/Extender: [name] in Treatment: 1 Wound Status Wound Number: 6 Primary Etiology: Diabetic Wound/Ulcer of the Lower Extremity Wound Location: Left, Medial Ankle Wound Status: Healed - Epithelialized Wounding Event: Blister Date Acquired: 09/10/2020 Weeks Of Treatment: 1 Clustered Wound: No Wound Measurements Length: (cm) Width: (cm) Depth: (cm) Area: (cm) Volume: (cm) 0 % Reduction in Area: 100% 0 % Reduction in Volume: 100% 0 0 0 Wound Description Classification: Grade 2 Treatment Notes Wound #6 (Ankle) Wound Laterality: Left, Medial Cleanser Peri-Wound Care Topical Primary Dressing Secondary Dressing Secured With Compression Wrap Compression Stockings Add-Ons Electronic Signature(s) Signed: 10/02/2020 5:09:10 PM By: Deon Pilling Entered By: Deon Pilling on 10/02/2020 11:03:51 -------------------------------------------------------------------------------- Wound Assessment Details Patient Name: Date of Service: [name] [name], [name] [name] 10/02/2020 10:45 A M Medical Record Number: [id number] Patient Account Number: [id number] Date of Birth/Sex: Treating RN: 03/27/1961 (60 y.o. [name] Primary Care Virtie Bungert: [name] Other Clinician: Referring Nasario Czerniak: Treating Idella Lamontagne/Extender: [name] in  Treatment: 1 Wound Status Wound Number: 7 Primary Diabetic Wound/Ulcer of the Lower Extremity Etiology: Wound Location: Left, Dorsal T Great oe Wound Open Wounding Event: Blister Status: Date Acquired: 09/10/2020 Comorbid Congestive Heart Failure, Hypertension, Type II Diabetes, Weeks Of Treatment: 1 History: Rheumatoid Arthritis,  Neuropathy Clustered Wound: No Photos Wound Measurements Length: (cm) 0.1 Width: (cm) 0.1 Depth: (cm) 0.1 Area: (cm) 0.008 Volume: (cm) 0.001 % Reduction in Area: 99.9% % Reduction in Volume: 99.8% Epithelialization: Large (67-100%) Tunneling: No Undermining: No Wound Description Classification: Grade 2 Wound Margin: Distinct, outline attached Exudate Amount: None Present Wound Bed Granulation Amount: None Present (0%) Necrotic Amount: None Present (0%) Foul Odor After Cleansing: No Slough/Fibrino No Exposed Structure Fascia Exposed: No Fat Layer (Subcutaneous Tissue) Exposed: No Tendon Exposed: No Muscle Exposed: No Joint Exposed: No Bone Exposed: No Assessment Notes wound scabbed over. Electronic Signature(s) Signed: 10/02/2020 5:09:10 PM By: Deon Pilling Signed: 10/06/2020 2:09:33 PM By: Sandre Kitty Entered By: Sandre Kitty on 10/02/2020 16:08:02 -------------------------------------------------------------------------------- Wound Assessment Details Patient Name: Date of Service: [name] [name], [place] 10/02/2020 10:45 A M Medical Record Number: [id number] Patient Account Number: [id number] Date of Birth/Sex: Treating RN: 1961/01/25 (60 y.o. Lorette Ang, Meta.Reding Primary Care Keldan Eplin: [name] Other Clinician: Referring Nasira Janusz: Treating Nakhia Levitan/Extender: [name] in Treatment: 1 Wound Status Wound Number: 8 Primary Diabetic Wound/Ulcer of the Lower Extremity Etiology: Wound Location: Left, Posterior Lower Leg Wound Converted Wounding Event: Blister Status: Date Acquired: 09/10/2020 Comorbid Congestive Heart Failure, Hypertension, Type II Diabetes, Weeks Of Treatment: 1 History: Rheumatoid Arthritis, Neuropathy Clustered Wound: No Wound Measurements Length: (cm) 15 Width: (cm) 17 Depth: (cm) 0.1 Area: (cm) 200.277 Volume: (cm) 20.028 % Reduction in Area: -151.2% % Reduction in Volume: -151.2% Wound  Description Classification: Grade 2 Assessment Notes converted to #4. Treatment Notes Wound #8 (Lower Leg) Wound Laterality: Left, Posterior Cleanser Peri-Wound Care Topical Primary Dressing Secondary Dressing Secured With Compression Wrap Compression Stockings Add-Ons Electronic Signature(s) Signed: 10/02/2020 5:09:10 PM By: Deon Pilling Entered By: Deon Pilling on 10/02/2020 11:09:02 -------------------------------------------------------------------------------- Wound Assessment Details Patient Name: Date of Service: [name] [name], [name] [name] 10/02/2020 10:45 A M Medical Record Number: [id number] Patient Account Number: [id number] Date of Birth/Sex: Treating RN: May 27, 1961 (60 y.o. Lorette Ang, Meta.Reding Primary Care Delta Deshmukh: [name] Other Clinician: Referring Caitlan Chauca: Treating Yotam Rhine/Extender: [name] in Treatment: 1 Wound Status Wound Number: 9 Primary Etiology: Diabetic Wound/Ulcer of the Lower Extremity Wound Location: Left, Lateral Ankle Wound Status: Healed - Epithelialized Wounding Event: Blister Date Acquired: 09/10/2020 Weeks Of Treatment: 1 Clustered Wound: No Wound Measurements Length: (cm) 0 Width: (cm) 0 Depth: (cm) 0 Area: (cm) 0 Volume: (cm) 0 % Reduction in Area: 100% % Reduction in Volume: 100% Wound Description Classification: Grade 2 Treatment Notes Wound #9 (Ankle) Wound Laterality: Left, Lateral Cleanser Peri-Wound Care Topical Primary Dressing Secondary Dressing Secured With Compression Wrap Compression Stockings Add-Ons Electronic Signature(s) Signed: 10/02/2020 5:09:10 PM By: Deon Pilling Entered By: Deon Pilling on 10/02/2020 11:03:51 -------------------------------------------------------------------------------- Vitals Details Patient Name: Date of Service: [name] [name], [place] 10/02/2020 10:45 A M Medical Record Number: [id number] Patient Account Number: [id number] Date of Birth/Sex: Treating  RN: [birth year] (60 y.o. [name] Primary Care Benicia Bergevin: [name] Other Clinician: Referring Steve Gregg: Treating Zykia Walla/Extender: [name] in Treatment: 1 Vital Signs Time Taken: 10:50 Temperature (F): 98.5 Height (in): 70 Pulse (bpm): 59 Weight (lbs): 148 Respiratory Rate (breaths/min): 20 Body Mass Index (BMI): 21.2 Blood Pressure (mmHg): 147/69 Capillary Blood Glucose (mg/dl):  114 Reference Range: 80 - 120 mg / dl Electronic Signature(s) Signed: 10/02/2020 5:09:10 PM By: Deon Pilling Entered By: Deon Pilling on 10/02/2020 11:02:36

## 2020-10-06 NOTE — Progress Notes (Signed)
SALVATORE, REINTS ([id number]) Visit Report for 10/06/2020 Arrival Information Details Patient Name: Date of Service: MARWAN, KISSELL 10/06/2020 10:15 A M Medical Record Number: [id number] Patient Account Number: [id number] Date of Birth/Sex: Treating RN: [birth year] (60 y.o. Marcheta Grammes Primary Care Kaelene Elliston: [name] Other Clinician: Referring Jerad Dunlap: Treating Adamarys Shall/Extender: [name] in Treatment: 1 Visit Information History Since Last Visit Added or deleted any medications: No Patient Arrived: Wheel Chair Any new allergies or adverse reactions: No Arrival Time: 10:28 Had a fall or experienced change in No Accompanied By: wife activities of daily living that may affect Transfer Assistance: Manual risk of falls: Patient Identification Verified: Yes Signs or symptoms of abuse/neglect since last visito No Secondary Verification Process Completed: Yes Hospitalized since last visit: No Patient Requires Transmission-Based Precautions: No Implantable device outside of the clinic excluding No Patient Has Alerts: No cellular tissue based products placed in the center since last visit: Has Dressing in Place as Prescribed: Yes Has Compression in Place as Prescribed: Yes Pain Present Now: No Electronic Signature(s) Signed: 10/06/2020 5:25:58 PM By: Lorrin Jackson Entered By: Lorrin Jackson on 10/06/2020 10:33:08 -------------------------------------------------------------------------------- Encounter Discharge Information Details Patient Name: Date of Service: BRO McSwain, [place] 10/06/2020 10:15 A M Medical Record Number: [id number] Patient Account Number: [id number] Date of Birth/Sex: Treating RN: [birth year] (60 y.o. [name] Primary Care Christan Ciccarelli: [name] Other Clinician: Referring Dominico Rod: Treating Kenedi Cilia/Extender: [name] in Treatment: 1 Encounter Discharge Information Items Post Procedure  Vitals Discharge Condition: Stable Temperature (F): 97.7 Ambulatory Status: Wheelchair Pulse (bpm): 51 Discharge Destination: Home Respiratory Rate (breaths/min): 16 Transportation: Private Auto Blood Pressure (mmHg): 124/62 Accompanied By: wife Schedule Follow-up Appointment: Yes Clinical Summary of Care: Notes patient declined surgical shoes. Patient requested to wear two pair of socks. Electronic Signature(s) Signed: 10/06/2020 6:08:37 PM By: Deon Pilling Entered By: Deon Pilling on 10/06/2020 11:49:29 -------------------------------------------------------------------------------- Lower Extremity Assessment Details Patient Name: Date of Service: MATAS, LAURENZI 10/06/2020 10:15 A M Medical Record Number: [id number] Patient Account Number: [id number] Date of Birth/Sex: Treating RN: 1960/07/22 (60 y.o. Marcheta Grammes Primary Care Daric Koren: [name] Other Clinician: Referring Yoneko Talerico: Treating Ashyra Cantin/Extender: [name] in Treatment: 1 Edema Assessment Assessed: Shirlyn Goltz: Yes] Patrice Paradise: Yes] Edema: [Left: No] [Right: No] Calf Left: Right: Point of Measurement: 30 cm From Medial Instep 32 cm 30 cm Ankle Left: Right: Point of Measurement: 9 cm From Medial Instep 22 cm 222 cm Vascular Assessment Pulses: Dorsalis Pedis Palpable: [Left:Yes] [Right:Yes] Electronic Signature(s) Signed: 10/06/2020 5:25:58 PM By: Lorrin Jackson Entered By: Lorrin Jackson on 10/06/2020 10:48:42 -------------------------------------------------------------------------------- Multi Wound Chart Details Patient Name: Date of Service: [name], [place] 10/06/2020 10:15 A M Medical Record Number: [id number] Patient Account Number: [id number] Date of Birth/Sex: Treating RN: [birth year] (60 y.o. [name] Primary Care Arkie Tagliaferro: [name] Other Clinician: Referring Aalyssa Elderkin: Treating Haig Gerardo/Extender: [name] in Treatment:  1 Vital Signs Height(in): 70 Capillary Blood Glucose(mg/dl): 95 Weight(lbs): 148 Pulse(bpm): 64 Body Mass Index(BMI): 21 Blood Pressure(mmHg): 124/62 Temperature(F): 97.7 Respiratory Rate(breaths/min): 16 Photos: [2:No Photos Right, Circumferential Lower Leg] [4:No Photos Left, Circumferential Lower Leg] [5:No Photos Left, Dorsal Foot] Wound Location: [2:Blister] [4:Blister] [5:Blister] Wounding Event: [2:Diabetic Wound/Ulcer of the Lower] [4:Diabetic Wound/Ulcer of the Lower] [5:Diabetic Wound/Ulcer of the Lower] Primary Etiology: [2:Extremity Congestive Heart Failure,] [4:Extremity Congestive Heart Failure,] [5:Extremity Congestive Heart Failure,] Comorbid History: [2:Hypertension, Type II Diabetes, Rheumatoid Arthritis, Neuropathy 09/10/2020] [4:Hypertension, Type II Diabetes, Rheumatoid Arthritis, Neuropathy 09/10/2020] [5:Hypertension, Type II  Diabetes, Rheumatoid Arthritis, Neuropathy 09/10/2020] Date Acquired: [2:1] [4:1] [5:1] Weeks of Treatment: [2:Open] [4:Open] [5:Open] Wound Status: [2:Yes] [4:No] [5:No] Clustered Wound: [2:2] [4:N/A] [5:N/A] Clustered Quantity: [2:12x9x0.1] [4:15x16x0.1] [5:0x0x0] Measurements L x W x D (cm) [2:84.823] [4:188.496] [5:0] A (cm) : rea [2:8.482] [4:18.85] [5:0] Volume (cm) : [2:18.00%] [4:-198.90%] [5:100.00%] % Reduction in A rea: [2:18.00%] [4:-198.90%] [5:100.00%] % Reduction in Volume: [2:Grade 2] [4:Grade 2] [5:Grade 2] Classification: [2:Large] [4:Large] [5:N/A] Exudate A mount: [2:Purulent] [4:Purulent] [5:N/A] Exudate Type: [2:yellow, [place], green] [4:yellow, [place], green] [5:N/A] Exudate Color: [2:No] [4:Yes] [5:N/A] Foul Odor A Cleansing: [2:fter N/A] [4:No] [5:N/A] Odor A nticipated Due to Product Use: [2:Distinct, outline attached] [4:Distinct, outline attached] [5:N/A] Wound Margin: [2:Large (67-100%)] [4:Large (67-100%)] [5:N/A] Granulation A mount: [2:Red, Pink] [4:Red, Pink] [5:N/A] Granulation Quality: [2:Small (1-33%)]  [4:Small (1-33%)] [5:N/A] Necrotic A mount: [2:Fat Layer (Subcutaneous Tissue): Yes Fat Layer (Subcutaneous Tissue): Yes N/A] Exposed Structures: [2:Fascia: No Tendon: No Muscle: No Joint: No Bone: No None] [4:Fascia: No Tendon: No Muscle: No Joint: No Bone: No None] [5:N/A] Epithelialization: [2:N/A] [4:N/A] [5:Debridement - Selective/Open Wound] Debridement: Pre-procedure Verification/Time Out N/A [4:N/A] [5:11:05] Taken: [2:N/A] [4:N/A] [5:Skin/Epidermis] Level: [2:N/A] [4:N/A] [5:30] Debridement A (sq cm): [2:rea N/A] [4:N/A] [5:Forceps, Scissors] Instrument: [2:N/A] [4:N/A] [5:None] Bleeding: [2:N/A] [4:N/A] [5:0] Procedural Pain: [2:N/A] [4:N/A] [5:0] Post Procedural Pain: [2:N/A] [4:N/A] [5:Procedure was tolerated well] Debridement Treatment Response: [2:N/A] [4:N/A] [5:0.1x0.1x0.1] Post Debridement Measurements L x W x D (cm) [2:N/A] [4:N/A] [5:0.001] Post Debridement Volume: (cm) [2:N/A] [4:N/A] [5:Debridement] Wound Number: 7 N/A N/A Photos: No Photos N/A N/A Left, Dorsal T Great oe N/A N/A Wound Location: Blister N/A N/A Wounding Event: Diabetic Wound/Ulcer of the Lower N/A N/A Primary Etiology: Extremity Congestive Heart Failure, N/A N/A Comorbid History: Hypertension, Type II Diabetes, Rheumatoid Arthritis, Neuropathy 09/10/2020 N/A N/A Date Acquired: 1 N/A N/A Weeks of Treatment: Open N/A N/A Wound Status: No N/A N/A Clustered Wound: N/A N/A N/A Clustered Quantity: 0.2x0.2x0 N/A N/A Measurements L x W x D (cm) 0.031 N/A N/A A (cm) : rea 0.003 N/A N/A Volume (cm) : 99.50% N/A N/A % Reduction in A rea: 99.50% N/A N/A % Reduction in Volume: Grade 2 N/A N/A Classification: None Present N/A N/A Exudate A mount: N/A N/A N/A Exudate Type: N/A N/A N/A Exudate Color: No N/A N/A Foul Odor A Cleansing: fter N/A N/A N/A Odor A nticipated Due to Product Use: Distinct, outline attached N/A N/A Wound Margin: Medium (34-66%) N/A N/A Granulation A  mount: Pink N/A N/A Granulation Quality: None Present (0%) N/A N/A Necrotic A mount: Fat Layer (Subcutaneous Tissue): Yes N/A N/A Exposed Structures: Fascia: No Tendon: No Muscle: No Joint: No Bone: No Large (67-100%) N/A N/A Epithelialization: Debridement - Selective/Open Wound N/A N/A Debridement: Pre-procedure Verification/Time Out 11:05 N/A N/A Taken: Skin/Epidermis N/A N/A Level: 6.25 N/A N/A Debridement A (sq cm): rea Forceps, Scissors N/A N/A Instrument: None N/A N/A Bleeding: 0 N/A N/A Procedural Pain: 0 N/A N/A Post Procedural Pain: Procedure was tolerated well N/A N/A Debridement Treatment Response: 0.1x0.1x0.1 N/A N/A Post Debridement Measurements L x W x D (cm) 0.001 N/A N/A Post Debridement Volume: (cm) Debridement N/A N/A Procedures Performed: Treatment Notes Electronic Signature(s) Signed: 10/06/2020 11:22:40 AM By: Kalman Shan DO Signed: 10/06/2020 6:28:10 PM By: Baruch Gouty RN, BSN Entered By: Kalman Shan on 10/06/2020 11:16:39 -------------------------------------------------------------------------------- Multi-Disciplinary Care Plan Details Patient Name: Date of Service: [name], [place] 10/06/2020 10:15 A M Medical Record Number: [id number] Patient Account Number: [id number] Date of Birth/Sex: Treating RN: 1961/05/31 (  60 y.o. [name] Primary Care Wrenn Willcox: [name] Other Clinician: Referring Dalisa Forrer: Treating Deneshia Zucker/Extender: [name] in Treatment: 1 Active Inactive Venous Leg Ulcer Nursing Diagnoses: Potential for venous Insuffiency (use before diagnosis confirmed) Goals: Patient will maintain optimal edema control Date Initiated: 10/06/2020 Target Resolution Date: 11/03/2020 Goal Status: Active Interventions: Assess peripheral edema status every visit. Compression as ordered Provide education on venous insufficiency Treatment Activities: Therapeutic compression  applied : 10/06/2020 Notes: Wound/Skin Impairment Nursing Diagnoses: Impaired tissue integrity Knowledge deficit related to ulceration/compromised skin integrity Goals: Patient will have a decrease in wound volume by X% from date: (specify in notes) Date Initiated: 09/25/2020 Target Resolution Date: 10/14/2020 Goal Status: Active Patient/caregiver will verbalize understanding of skin care regimen Date Initiated: 09/25/2020 Target Resolution Date: 10/17/2020 Goal Status: Active Ulcer/skin breakdown will have a volume reduction of 30% by week 4 Date Initiated: 09/25/2020 Target Resolution Date: 10/17/2020 Goal Status: Active Interventions: Assess patient/caregiver ability to obtain necessary supplies Assess patient/caregiver ability to perform ulcer/skin care regimen upon admission and as needed Assess ulceration(s) every visit Notes: Electronic Signature(s) Signed: 10/06/2020 6:28:10 PM By: Baruch Gouty RN, BSN Entered By: Baruch Gouty on 10/06/2020 11:09:53 -------------------------------------------------------------------------------- Pain Assessment Details Patient Name: Date of Service: [name], [place] 10/06/2020 10:15 A M Medical Record Number: [id number] Patient Account Number: [id number] Date of Birth/Sex: Treating RN: [birth year] (60 y.o. Marcheta Grammes Primary Care Gavina Dildine: [name] Other Clinician: Referring Jynesis Nakamura: Treating Jaqualin Serpa/Extender: [name] in Treatment: 1 Active Problems Location of Pain Severity and Description of Pain Patient Has Paino Yes Site Locations With Dressing Change: Yes Duration of the Pain. Constant / Intermittento Intermittent Rate the pain. Current Pain Level: 4 Worst Pain Level: 9 Character of Pain Describe the Pain: Aching, Burning Pain Management and Medication Current Pain Management: Medication: Yes Cold Application: No Rest: Yes Massage: No Activity: No T.E.N.S.: No Heat Application:  No Leg drop or elevation: No Is the Current Pain Management Adequate: Inadequate How does your wound impact your activities of daily livingo Sleep: Yes Bathing: No Appetite: No Relationship With Others: No Bladder Continence: No Emotions: No Bowel Continence: No Work: No Toileting: No Drive: No Dressing: No Hobbies: No Electronic Signature(s) Signed: 10/06/2020 5:25:58 PM By: Lorrin Jackson Entered By: Lorrin Jackson on 10/06/2020 10:34:40 -------------------------------------------------------------------------------- Patient/Caregiver Education Details Patient Name: Date of Service: [name], [place] 4/26/2022andnbsp10:15 A M Medical Record Number: [id number] Patient Account Number: [id number] Date of Birth/Gender: Treating RN: [birth year] (60 y.o. [name] Primary Care Physician: [name] Other Clinician: Referring Physician: Treating Physician/Extender: [name] in Treatment: 1 Education Assessment Education Provided To: Patient Education Topics Provided Venous: Methods: Explain/Verbal Responses: Reinforcements needed, State content correctly Wound/Skin Impairment: Methods: Explain/Verbal Responses: Reinforcements needed, State content correctly Electronic Signature(s) Signed: 10/06/2020 6:28:10 PM By: Baruch Gouty RN, BSN Entered By: Baruch Gouty on 10/06/2020 11:10:30 -------------------------------------------------------------------------------- Wound Assessment Details Patient Name: Date of Service: [name], [place] 10/06/2020 10:15 A M Medical Record Number: [id number] Patient Account Number: [id number] Date of Birth/Sex: Treating RN: 28-Jan-1961 (60 y.o. Marcheta Grammes Primary Care Adysson Revelle: [name] Other Clinician: Referring Meenakshi Sazama: Treating Xion Debruyne/Extender: [name] in Treatment: 1 Wound Status Wound Number: 2 Primary Diabetic Wound/Ulcer of the Lower  Extremity Etiology: Wound Location: Right, Circumferential Lower Leg Wound Open Wounding Event: Blister Status: Date Acquired: 09/10/2020 Comorbid Congestive Heart Failure, Hypertension, Type II Diabetes, Weeks Of Treatment: 1 History: Rheumatoid Arthritis, Neuropathy Clustered Wound: Yes Photos Wound Measurements Length: (  cm) 12 Width: (cm) 9 Depth: (cm) 0.1 Clustered Quantity: 2 Area: (cm) 84.823 Volume: (cm) 8.482 % Reduction in Area: 18% % Reduction in Volume: 18% Epithelialization: None Tunneling: No Undermining: No Wound Description Classification: Grade 2 Wound Margin: Distinct, outline attached Exudate Amount: Large Exudate Type: Purulent Exudate Color: yellow, [place], green Foul Odor After Cleansing: No Slough/Fibrino Yes Wound Bed Granulation Amount: Large (67-100%) Exposed Structure Granulation Quality: Red, Pink Fascia Exposed: No Necrotic Amount: Small (1-33%) Fat Layer (Subcutaneous Tissue) Exposed: Yes Necrotic Quality: Adherent Slough Tendon Exposed: No Muscle Exposed: No Joint Exposed: No Bone Exposed: No Treatment Notes Wound #2 (Lower Leg) Wound Laterality: Right, Circumferential Cleanser Soap and Water Discharge Instruction: May shower and wash wound with dial antibacterial soap and water prior to dressing change. Wound Cleanser Discharge Instruction: Cleanse the wound with wound cleanser prior to applying a clean dressing using gauze sponges, not tissue or cotton balls. Peri-Wound Care Sween Lotion (Moisturizing lotion) Discharge Instruction: Apply moisturizing lotion as directed Topical Primary Dressing KerraCel Ag Gelling Fiber Dressing, 4x5 in (silver alginate) Discharge Instruction: Apply silver alginate to wound bed as instructed Secondary Dressing ABD Pad, 5x9 Discharge Instruction: Apply over primary dressing as directed. Secured With Compression Wrap Kerlix Roll 4.5x3.1 (in/yd) Discharge Instruction: Apply Kerlix and Coban  compression as directed. Coban Self-Adherent Wrap 4x5 (in/yd) Discharge Instruction: Apply over Kerlix as directed. Compression Stockings Add-Ons Electronic Signature(s) Signed: 10/06/2020 5:14:41 PM By: Sandre Kitty Signed: 10/06/2020 5:25:58 PM By: Lorrin Jackson Entered By: Sandre Kitty on 10/06/2020 16:09:34 -------------------------------------------------------------------------------- Wound Assessment Details Patient Name: Date of Service: [name], [place] 10/06/2020 10:15 A M Medical Record Number: [id number] Patient Account Number: [id number] Date of Birth/Sex: Treating RN: [birth year] (60 y.o. Marcheta Grammes Primary Care Anida Deol: [name] Other Clinician: Referring Naiah Donahoe: Treating Calixto Pavel/Extender: [name] in Treatment: 1 Wound Status Wound Number: 4 Primary Diabetic Wound/Ulcer of the Lower Extremity Etiology: Wound Location: Left, Circumferential Lower Leg Wound Open Wounding Event: Blister Status: Date Acquired: 09/10/2020 Comorbid Congestive Heart Failure, Hypertension, Type II Diabetes, Weeks Of Treatment: 1 History: Rheumatoid Arthritis, Neuropathy Clustered Wound: No Photos Wound Measurements Length: (cm) 15 Width: (cm) 16 Depth: (cm) 0.1 Area: (cm) 188.496 Volume: (cm) 18.85 % Reduction in Area: -198.9% % Reduction in Volume: -198.9% Epithelialization: None Tunneling: No Undermining: No Wound Description Classification: Grade 2 Wound Margin: Distinct, outline attached Exudate Amount: Large Exudate Type: Purulent Exudate Color: yellow, [place], green Foul Odor After Cleansing: Yes Due to Product Use: No Slough/Fibrino Yes Wound Bed Granulation Amount: Large (67-100%) Exposed Structure Granulation Quality: Red, Pink Fascia Exposed: No Necrotic Amount: Small (1-33%) Fat Layer (Subcutaneous Tissue) Exposed: Yes Necrotic Quality: Adherent Slough Tendon Exposed: No Muscle Exposed: No Joint Exposed:  No Bone Exposed: No Treatment Notes Wound #4 (Lower Leg) Wound Laterality: Left, Circumferential Cleanser Soap and Water Discharge Instruction: May shower and wash wound with dial antibacterial soap and water prior to dressing change. Wound Cleanser Discharge Instruction: Cleanse the wound with wound cleanser prior to applying a clean dressing using gauze sponges, not tissue or cotton balls. Peri-Wound Care Sween Lotion (Moisturizing lotion) Discharge Instruction: Apply moisturizing lotion as directed Topical Primary Dressing KerraCel Ag Gelling Fiber Dressing, 4x5 in (silver alginate) Discharge Instruction: Apply silver alginate to wound bed as instructed Secondary Dressing ABD Pad, 5x9 Discharge Instruction: Apply over primary dressing as directed. Secured With Compression Wrap Kerlix Roll 4.5x3.1 (in/yd) Discharge Instruction: Apply Kerlix and Coban compression as directed. Coban Self-Adherent Wrap 4x5 (in/yd) Discharge Instruction: Apply over  Kerlix as directed. Compression Stockings Add-Ons Electronic Signature(s) Signed: 10/06/2020 5:14:41 PM By: Sandre Kitty Signed: 10/06/2020 5:25:58 PM By: Lorrin Jackson Entered By: Sandre Kitty on 10/06/2020 16:10:04 -------------------------------------------------------------------------------- Wound Assessment Details Patient Name: Date of Service: [name], [place] 10/06/2020 10:15 A M Medical Record Number: [id number] Patient Account Number: [id number] Date of Birth/Sex: Treating RN: 29-Mar-1961 (60 y.o. Marcheta Grammes Primary Care Aarohi Redditt: [name] Other Clinician: Referring Anuja Manka: Treating Gracianna Vink/Extender: [name] in Treatment: 1 Wound Status Wound Number: 5 Primary Diabetic Wound/Ulcer of the Lower Extremity Etiology: Wound Location: Left, Dorsal Foot Wound Open Wounding Event: Blister Status: Date Acquired: 09/10/2020 Comorbid Congestive Heart Failure, Hypertension,  Type II Diabetes, Weeks Of Treatment: 1 History: Rheumatoid Arthritis, Neuropathy Clustered Wound: No Photos Wound Measurements Length: (cm) Width: (cm) Depth: (cm) Area: (cm) Volume: (cm) 0 % Reduction in Area: 100% 0 % Reduction in Volume: 100% 0 0 0 Wound Description Classification: Grade 2 Treatment Notes Wound #5 (Foot) Wound Laterality: Dorsal, Left Cleanser Soap and Water Discharge Instruction: May shower and wash wound with dial antibacterial soap and water prior to dressing change. Wound Cleanser Discharge Instruction: Cleanse the wound with wound cleanser prior to applying a clean dressing using gauze sponges, not tissue or cotton balls. Peri-Wound Care Topical Primary Dressing Xeroform Occlusive Gauze Dressing, 4x4 in Discharge Instruction: Apply to wound bed as instructed Secondary Dressing Woven Gauze Sponge, Non-Sterile 4x4 in Discharge Instruction: Apply over primary dressing as directed. Secured With Compression Wrap Kerlix Roll 4.5x3.1 (in/yd) Discharge Instruction: Apply Kerlix and Coban compression as directed. Coban Self-Adherent Wrap 4x5 (in/yd) Discharge Instruction: Apply over Kerlix as directed. Compression Stockings Add-Ons Electronic Signature(s) Signed: 10/06/2020 5:14:41 PM By: Sandre Kitty Signed: 10/06/2020 5:25:58 PM By: Lorrin Jackson Entered By: Sandre Kitty on 10/06/2020 16:10:48 -------------------------------------------------------------------------------- Wound Assessment Details Patient Name: Date of Service: [name], [place] 10/06/2020 10:15 A M Medical Record Number: [id number] Patient Account Number: [id number] Date of Birth/Sex: Treating RN: 09-04-1960 (60 y.o. Marcheta Grammes Primary Care Arpi Diebold: [name] Other Clinician: Referring Jakhai Fant: Treating Croix Presley/Extender: [name] in Treatment: 1 Wound Status Wound Number: 7 Primary Diabetic Wound/Ulcer of the Lower  Extremity Etiology: Wound Location: Left, Dorsal T Great oe Wound Open Wounding Event: Blister Status: Date Acquired: 09/10/2020 Comorbid Congestive Heart Failure, Hypertension, Type II Diabetes, Weeks Of Treatment: 1 History: Rheumatoid Arthritis, Neuropathy Clustered Wound: No Photos Wound Measurements Length: (cm) 0.2 Width: (cm) 0.2 Depth: (cm) 0 Area: (cm) 0.031 Volume: (cm) 0.003 % Reduction in Area: 99.5% % Reduction in Volume: 99.5% Epithelialization: Large (67-100%) Tunneling: No Undermining: No Wound Description Classification: Grade 2 Wound Margin: Distinct, outline attached Exudate Amount: None Present Foul Odor After Cleansing: No Slough/Fibrino No Wound Bed Granulation Amount: Medium (34-66%) Exposed Structure Granulation Quality: Pink Fascia Exposed: No Necrotic Amount: None Present (0%) Fat Layer (Subcutaneous Tissue) Exposed: Yes Tendon Exposed: No Muscle Exposed: No Joint Exposed: No Bone Exposed: No Treatment Notes Wound #7 (Toe Great) Wound Laterality: Dorsal, Left Cleanser Soap and Water Discharge Instruction: May shower and wash wound with dial antibacterial soap and water prior to dressing change. Wound Cleanser Discharge Instruction: Cleanse the wound with wound cleanser prior to applying a clean dressing using gauze sponges, not tissue or cotton balls. Peri-Wound Care Topical Primary Dressing Xeroform Occlusive Gauze Dressing, 4x4 in Discharge Instruction: Apply to wound bed as instructed Secondary Dressing Woven Gauze Sponge, Non-Sterile 4x4 in Discharge Instruction: Apply over primary dressing as directed. Secured With Conforming Stretch Gauze Bandage, Sterile  2x75 (in/in) Discharge Instruction: Secure with stretch gauze as directed. Compression Wrap Compression Stockings Add-Ons Electronic Signature(s) Signed: 10/06/2020 5:14:41 PM By: Sandre Kitty Signed: 10/06/2020 5:25:58 PM By: Lorrin Jackson Entered By: Sandre Kitty on 10/06/2020 16:10:25 -------------------------------------------------------------------------------- Vitals Details Patient Name: Date of Service: BRO [place], [place] 10/06/2020 10:15 A M Medical Record Number: [id number] Patient Account Number: [id number] Date of Birth/Sex: Treating RN: [birth year] (60 y.o. Marcheta Grammes Primary Care Nyah Shepherd: [name] Other Clinician: Referring Ravyn Nikkel: Treating Nicolle Heward/Extender: [name] in Treatment: 1 Vital Signs Time Taken: 10:33 Temperature (F): 97.7 Height (in): 70 Pulse (bpm): 51 Weight (lbs): 148 Respiratory Rate (breaths/min): 16 Body Mass Index (BMI): 21.2 Blood Pressure (mmHg): 124/62 Capillary Blood Glucose (mg/dl): 95 Reference Range: 80 - 120 mg / dl Electronic Signature(s) Signed: 10/06/2020 5:25:58 PM By: Lorrin Jackson Entered By: Lorrin Jackson on 10/06/2020 10:33:59

## 2020-10-08 DIAGNOSIS — E1122 Type 2 diabetes mellitus with diabetic chronic kidney disease: Secondary | ICD-10-CM | POA: Diagnosis not present

## 2020-10-08 DIAGNOSIS — R601 Generalized edema: Secondary | ICD-10-CM | POA: Diagnosis not present

## 2020-10-08 DIAGNOSIS — E785 Hyperlipidemia, unspecified: Secondary | ICD-10-CM | POA: Diagnosis not present

## 2020-10-08 DIAGNOSIS — N179 Acute kidney failure, unspecified: Secondary | ICD-10-CM | POA: Diagnosis not present

## 2020-10-08 DIAGNOSIS — I5042 Chronic combined systolic (congestive) and diastolic (congestive) heart failure: Secondary | ICD-10-CM | POA: Diagnosis not present

## 2020-10-08 DIAGNOSIS — N184 Chronic kidney disease, stage 4 (severe): Secondary | ICD-10-CM | POA: Diagnosis not present

## 2020-10-08 DIAGNOSIS — N049 Nephrotic syndrome with unspecified morphologic changes: Secondary | ICD-10-CM | POA: Diagnosis not present

## 2020-10-08 DIAGNOSIS — N1832 Chronic kidney disease, stage 3b: Secondary | ICD-10-CM | POA: Diagnosis not present

## 2020-10-13 ENCOUNTER — Encounter (HOSPITAL_COMMUNITY): Payer: Self-pay

## 2020-10-13 ENCOUNTER — Ambulatory Visit (HOSPITAL_COMMUNITY): Payer: Medicare HMO | Attending: Cardiovascular Disease

## 2020-10-13 ENCOUNTER — Encounter (HOSPITAL_BASED_OUTPATIENT_CLINIC_OR_DEPARTMENT_OTHER): Payer: Medicare HMO | Attending: Internal Medicine | Admitting: Internal Medicine

## 2020-10-13 ENCOUNTER — Encounter (HOSPITAL_COMMUNITY): Payer: Self-pay | Admitting: Cardiology

## 2020-10-13 ENCOUNTER — Other Ambulatory Visit: Payer: Self-pay

## 2020-10-13 DIAGNOSIS — E1140 Type 2 diabetes mellitus with diabetic neuropathy, unspecified: Secondary | ICD-10-CM | POA: Insufficient documentation

## 2020-10-13 DIAGNOSIS — L97829 Non-pressure chronic ulcer of other part of left lower leg with unspecified severity: Secondary | ICD-10-CM | POA: Diagnosis not present

## 2020-10-13 DIAGNOSIS — E1121 Type 2 diabetes mellitus with diabetic nephropathy: Secondary | ICD-10-CM | POA: Diagnosis not present

## 2020-10-13 DIAGNOSIS — L97822 Non-pressure chronic ulcer of other part of left lower leg with fat layer exposed: Secondary | ICD-10-CM | POA: Diagnosis not present

## 2020-10-13 DIAGNOSIS — I110 Hypertensive heart disease with heart failure: Secondary | ICD-10-CM | POA: Insufficient documentation

## 2020-10-13 DIAGNOSIS — I509 Heart failure, unspecified: Secondary | ICD-10-CM | POA: Diagnosis not present

## 2020-10-13 DIAGNOSIS — M069 Rheumatoid arthritis, unspecified: Secondary | ICD-10-CM | POA: Insufficient documentation

## 2020-10-13 DIAGNOSIS — E11622 Type 2 diabetes mellitus with other skin ulcer: Secondary | ICD-10-CM | POA: Insufficient documentation

## 2020-10-13 DIAGNOSIS — E1165 Type 2 diabetes mellitus with hyperglycemia: Secondary | ICD-10-CM | POA: Diagnosis not present

## 2020-10-13 DIAGNOSIS — L97812 Non-pressure chronic ulcer of other part of right lower leg with fat layer exposed: Secondary | ICD-10-CM | POA: Insufficient documentation

## 2020-10-13 DIAGNOSIS — L97819 Non-pressure chronic ulcer of other part of right lower leg with unspecified severity: Secondary | ICD-10-CM | POA: Diagnosis not present

## 2020-10-13 NOTE — Progress Notes (Signed)
Verified appointment "no show" status with R. Cathey at 08:32.

## 2020-10-16 NOTE — Progress Notes (Signed)
[name], [name] ([id number]) Visit Report for 10/13/2020 Arrival Information Details Patient Name: Date of Service: [name] [name], [name] [name] 10/13/2020 10:45 A M Medical Record Number: [id number] Patient Account Number: [id number] Date of Birth/Sex: Treating RN: [birth year] (60 y.o. [name] Primary Care Madden Garron: [name] Other Clinician: Referring Chonda Baney: Treating Theona Muhs/Extender: [name] in Treatment: 2 Visit Information History Since Last Visit Added or deleted any medications: No Patient Arrived: Wheel Chair Any new allergies or adverse reactions: No Arrival Time: 11:00 Had a fall or experienced change in No Accompanied By: wife activities of daily living that may affect Transfer Assistance: None risk of falls: Patient Identification Verified: Yes Signs or symptoms of abuse/neglect since last visito No Secondary Verification Process Completed: Yes Hospitalized since last visit: No Patient Requires Transmission-Based Precautions: No Implantable device outside of the clinic excluding No Patient Has Alerts: No cellular tissue based products placed in the center since last visit: Has Dressing in Place as Prescribed: Yes Has Compression in Place as Prescribed: Yes Pain Present Now: Yes Electronic Signature(s) Signed: 10/14/2020 6:54:10 PM By: Deon Pilling Entered By: Deon Pilling on 10/13/2020 11:01:11 -------------------------------------------------------------------------------- Encounter Discharge Information Details Patient Name: Date of Service: [name] [name], [name] 10/13/2020 10:45 A M Medical Record Number: [id number] Patient Account Number: [id number] Date of Birth/Sex: Treating RN: [birth year] (60 y.o. [name] Primary Care Laszlo Ellerby: [name] Other Clinician: Referring Keymora Grillot: Treating Tammatha Cobb/Extender: [name] in Treatment: 2 Encounter Discharge Information Items Post Procedure  Vitals Discharge Condition: Stable Temperature (F): 97.9 Ambulatory Status: Wheelchair Pulse (bpm): 51 Discharge Destination: Home Respiratory Rate (breaths/min): 18 Transportation: Private Auto Blood Pressure (mmHg): 142/70 Accompanied By: wife Schedule Follow-up Appointment: Yes Clinical Summary of Care: Electronic Signature(s) Signed: 10/14/2020 6:54:10 PM By: Deon Pilling Entered By: Deon Pilling on 10/13/2020 11:25:36 -------------------------------------------------------------------------------- Lower Extremity Assessment Details Patient Name: Date of Service: [name], [name] 10/13/2020 10:45 A M Medical Record Number: [id number] Patient Account Number: [id number] Date of Birth/Sex: Treating RN: May 04, 1961 (60 y.o. [name] Primary Care Yeimi Debnam: [name] Other Clinician: Referring Jamesetta Greenhalgh: Treating Beverly Ferner/Extender: [name] in Treatment: 2 Edema Assessment Assessed: Shirlyn Goltz: Yes] Patrice Paradise: Yes] Edema: [Left: No] [Right: No] Calf Left: Right: Point of Measurement: 30 cm From Medial Instep 29.5 cm 30 cm Ankle Left: Right: Point of Measurement: 9 cm From Medial Instep 21.5 cm 21.5 cm Vascular Assessment Pulses: Dorsalis Pedis Palpable: [Left:Yes] [Right:Yes] Electronic Signature(s) Signed: 10/14/2020 6:54:10 PM By: Deon Pilling Entered By: Deon Pilling on 10/13/2020 11:11:47 -------------------------------------------------------------------------------- Multi Wound Chart Details Patient Name: Date of Service: [name] [name], [name] [name] 10/13/2020 10:45 A M Medical Record Number: [id number] Patient Account Number: [id number] Date of Birth/Sex: Treating RN: [birth year] (60 y.o. [name] [name], [name] [name] Primary Care Brigg Cape: [name] Other Clinician: Referring Marua Qin: Treating Jaxson Anglin/Extender: [name] in Treatment: 2 Vital Signs Height(in): 70 Capillary Blood Glucose(mg/dl): 80 Weight(lbs):  148 Pulse(bpm): 65 Body Mass Index(BMI): 21 Blood Pressure(mmHg): 142/70 Temperature(F): 97.9 Respiratory Rate(breaths/min): 18 Photos: [2:No Photos Right, Circumferential Lower Leg] [4:No Photos Left, Circumferential Lower Leg] [5:No Photos Left, Dorsal Foot] Wound Location: [2:Blister] [4:Blister] [5:Blister] Wounding Event: [2:Diabetic Wound/Ulcer of the Lower] [4:Diabetic Wound/Ulcer of the Lower] [5:Diabetic Wound/Ulcer of the Lower] Primary Etiology: [2:Extremity Congestive Heart Failure,] [4:Extremity Congestive Heart Failure,] [5:Extremity N/A] Comorbid History: [2:Hypertension, Type II Diabetes, Rheumatoid Arthritis, Neuropathy 09/10/2020] [4:Hypertension, Type II Diabetes, Rheumatoid Arthritis, Neuropathy 09/10/2020] [5:09/10/2020] Date Acquired: [2:2] [4:2] [5:2] Weeks of Treatment: [2:Open] [4:Open] [5:Healed - Epithelialized] Wound Status: [2:Yes] [4:No] [  5:No] Clustered Wound: [2:0.1x0.1x0.1] [4:0.1x0.1x0.1] [5:0x0x0] Measurements L x W x D (cm) [2:0.008] [4:0.008] [5:0] A (cm) : rea [2:0.001] [4:0.001] [5:0] Volume (cm) : [2:100.00%] [4:100.00%] [5:100.00%] % Reduction in A rea: [2:100.00%] [4:100.00%] [5:100.00%] % Reduction in Volume: [2:Grade 2] [4:Grade 2] [5:Grade 2] Classification: [2:None Present] [4:None Present] [5:N/A] Exudate A mount: [2:Distinct, outline attached] [4:Distinct, outline attached] [5:N/A] Wound Margin: [2:None Present (0%)] [4:None Present (0%)] [5:N/A] Granulation A mount: [2:None Present (0%)] [4:None Present (0%)] [5:N/A] Necrotic A mount: [2:Fat Layer (Subcutaneous Tissue): Yes Fat Layer (Subcutaneous Tissue): Yes N/A] Exposed Structures: [2:Fascia: No Tendon: No Muscle: No Joint: No Bone: No Large (67-100%)] [4:Fascia: No Tendon: No Muscle: No Joint: No Bone: No Large (67-100%)] [5:N/A] Epithelialization: [2:Debridement - Selective/Open Wound Debridement - Selective/Open Wound N/A] Debridement: Pre-procedure Verification/Time Out 11:15  [4:11:15] [5:N/A] Taken: [2:Lidocaine 4% Topical Solution] [4:Lidocaine 4% Topical Solution] [5:N/A] Pain Control: [2:Necrotic/Eschar] [4:Necrotic/Eschar] [5:N/A] Tissue Debrided: [2:Non-Viable Tissue] [4:Skin/Epidermis] [5:N/A] Level: [2:3] [4:6] [5:N/A] Debridement A (sq cm): [2:rea Curette] [4:Curette] [5:N/A] Instrument: [2:Moderate] [4:Moderate] [5:N/A] Bleeding: [2:Pressure] [4:Pressure] [5:N/A] Hemostasis A chieved: [2:0] [4:0] [5:N/A] Procedural Pain: [2:3] [4:3] [5:N/A] Post Procedural Pain: [2:Procedure was tolerated well] [4:Procedure was tolerated well] [5:N/A] Debridement Treatment Response: [2:3x0.5x0.1] [4:3x2x0.1] [5:N/A] Post Debridement Measurements L x W x D (cm) [2:0.118] [4:0.471] [5:N/A] Post Debridement Volume: (cm) [2:scabbed over.] [4:scabbed over.] [5:N/A] Assessment Notes: [2:Debridement] [4:Debridement] [5:N/A] Wound Number: 7 N/A N/A Photos: No Photos N/A N/A Left, Dorsal T Great oe N/A N/A Wound Location: Blister N/A N/A Wounding Event: Diabetic Wound/Ulcer of the Lower N/A N/A Primary Etiology: Extremity N/A N/A N/A Comorbid History: 09/10/2020 N/A N/A Date A cquired: 2 N/A N/A Weeks of Treatment: Healed - Epithelialized N/A N/A Wound Status: No N/A N/A Clustered Wound: 0x0x0 N/A N/A Measurements L x W x D (cm) 0 N/A N/A A (cm) : rea 0 N/A N/A Volume (cm) : 100.00% N/A N/A % Reduction in A rea: 100.00% N/A N/A % Reduction in Volume: Grade 2 N/A N/A Classification: N/A N/A N/A Exudate A mount: N/A N/A N/A Wound Margin: N/A N/A N/A Granulation A mount: N/A N/A N/A Necrotic A mount: N/A N/A N/A Exposed Structures: N/A N/A N/A Epithelialization: N/A N/A N/A Debridement: N/A N/A N/A Pain Control: N/A N/A N/A Tissue Debrided: N/A N/A N/A Level: N/A N/A N/A Debridement A (sq cm): rea N/A N/A N/A Instrument: N/A N/A N/A Bleeding: N/A N/A N/A Hemostasis A chieved: N/A N/A N/A Procedural Pain: N/A N/A N/A Post  Procedural Pain: Debridement Treatment Response: N/A N/A N/A Post Debridement Measurements L x N/A N/A N/A W x D (cm) N/A N/A N/A Post Debridement Volume: (cm) N/A N/A N/A Assessment Notes: N/A N/A N/A Procedures Performed: Treatment Notes Wound #2 (Lower Leg) Wound Laterality: Right, Circumferential Cleanser Soap and Water Discharge Instruction: May shower and wash wound with dial antibacterial soap and water prior to dressing change. Wound Cleanser Discharge Instruction: Cleanse the wound with wound cleanser prior to applying a clean dressing using gauze sponges, not tissue or cotton balls. Peri-Wound Care Triamcinolone 15 (g) Discharge Instruction: Only apply in clinic. Use triamcinolone 15 (g) as directed Sween Lotion (Moisturizing lotion) Discharge Instruction: Apply moisturizing lotion as directed Topical Primary Dressing Promogran Prisma Matrix, 4.34 (sq in) (silver collagen) Discharge Instruction: Moisten collagen with saline or hydrogel Secondary Dressing Woven Gauze Sponge, Non-Sterile 4x4 in Discharge Instruction: Apply over primary dressing as directed. Secured With Compression Wrap Kerlix Roll 4.5x3.1 (in/yd) Discharge Instruction: Apply Kerlix and Coban compression as directed. ***Apply lightly.*** Coban  Self-Adherent Wrap 4x5 (in/yd) Discharge Instruction: Apply over Kerlix as directed. Compression Stockings Add-Ons Wound #4 (Lower Leg) Wound Laterality: Left, Circumferential Cleanser Soap and Water Discharge Instruction: May shower and wash wound with dial antibacterial soap and water prior to dressing change. Wound Cleanser Discharge Instruction: Cleanse the wound with wound cleanser prior to applying a clean dressing using gauze sponges, not tissue or cotton balls. Peri-Wound Care Triamcinolone 15 (g) Discharge Instruction: Only apply in clinic. Use triamcinolone 15 (g) as directed Sween Lotion (Moisturizing lotion) Discharge Instruction: Apply  moisturizing lotion as directed Topical Primary Dressing Promogran Prisma Matrix, 4.34 (sq in) (silver collagen) Discharge Instruction: Moisten collagen with saline or hydrogel Secondary Dressing Woven Gauze Sponge, Non-Sterile 4x4 in Discharge Instruction: Apply over primary dressing as directed. Secured With Compression Wrap Kerlix Roll 4.5x3.1 (in/yd) Discharge Instruction: Apply Kerlix and Coban compression as directed. ***Apply lightly.*** Coban Self-Adherent Wrap 4x5 (in/yd) Discharge Instruction: Apply over Kerlix as directed. Compression Stockings Add-Ons Wound #5 (Foot) Wound Laterality: Dorsal, Left Cleanser Peri-Wound Care Topical Primary Dressing Secondary Dressing Secured With Compression Wrap Compression Stockings Add-Ons Wound #7 (Toe Great) Wound Laterality: Dorsal, Left Cleanser Peri-Wound Care Topical Primary Dressing Secondary Dressing Secured With Compression Wrap Compression Stockings Add-Ons Electronic Signature(s) Signed: 10/13/2020 11:36:44 AM By: Kalman Shan DO Signed: 10/16/2020 3:14:58 PM By: Rhae Hammock RN Entered By: Kalman Shan on 10/13/2020 11:30:30 -------------------------------------------------------------------------------- Multi-Disciplinary Care Plan Details Patient Name: Date of Service: [name] [name], [place] 10/13/2020 10:45 A M Medical Record Number: [id number] Patient Account Number: [id number] Date of Birth/Sex: Treating RN: Oct 06, 1960 (60 y.o. [name] Primary Care Lissette Schenk: [name] Other Clinician: Referring Makaio Mach: Treating Jayziah Bankhead/Extender: [name] in Treatment: 2 Active Inactive Venous Leg Ulcer Nursing Diagnoses: Potential for venous Insuffiency (use before diagnosis confirmed) Goals: Patient will maintain optimal edema control Date Initiated: 10/06/2020 Target Resolution Date: 11/03/2020 Goal Status: Active Interventions: Assess peripheral edema status every  visit. Compression as ordered Provide education on venous insufficiency Treatment Activities: Therapeutic compression applied : 10/06/2020 Notes: Wound/Skin Impairment Nursing Diagnoses: Impaired tissue integrity Knowledge deficit related to ulceration/compromised skin integrity Goals: Patient will have a decrease in wound volume by X% from date: (specify in notes) Date Initiated: 09/25/2020 Target Resolution Date: 10/14/2020 Goal Status: Active Patient/caregiver will verbalize understanding of skin care regimen Date Initiated: 09/25/2020 Date Inactivated: 10/13/2020 Target Resolution Date: 10/17/2020 Goal Status: Met Ulcer/skin breakdown will have a volume reduction of 30% by week 4 Date Initiated: 09/25/2020 Date Inactivated: 10/13/2020 Target Resolution Date: 10/17/2020 Goal Status: Met Interventions: Assess patient/caregiver ability to obtain necessary supplies Assess patient/caregiver ability to perform ulcer/skin care regimen upon admission and as needed Assess ulceration(s) every visit Notes: Electronic Signature(s) Signed: 10/14/2020 6:54:10 PM By: Deon Pilling Entered By: Deon Pilling on 10/13/2020 11:14:20 -------------------------------------------------------------------------------- Pain Assessment Details Patient Name: Date of ServiceCLARE, CASTO 10/13/2020 10:45 A M Medical Record Number: [id number] Patient Account Number: [id number] Date of Birth/Sex: Treating RN: 1960/12/27 (60 y.o. [name] Primary Care Dannelle Rhymes: [name] Other Clinician: Referring Voyd Groft: Treating Angelica Wix/Extender: [name] in Treatment: 2 Active Problems Location of Pain Severity and Description of Pain Patient Has Paino Yes Site Locations Pain Location: Pain in Ulcers Rate the pain. Current Pain Level: 7 Worst Pain Level: 10 Least Pain Level: 0 Tolerable Pain Level: 7 Character of Pain Describe the Pain: Heavy, Sharp Pain Management and  Medication Current Pain Management: Medication: No Cold Application: No Rest: No Massage: No Activity: No T.E.N.S.: No Heat Application: No  Leg drop or elevation: No Is the Current Pain Management Adequate: Adequate How does your wound impact your activities of daily livingo Sleep: No Bathing: No Appetite: No Relationship With Others: No Bladder Continence: No Emotions: No Bowel Continence: No Work: No Toileting: No Drive: No Dressing: No Hobbies: No Electronic Signature(s) Signed: 10/14/2020 6:54:10 PM By: Deon Pilling Entered By: Deon Pilling on 10/13/2020 11:01:33 -------------------------------------------------------------------------------- Patient/Caregiver Education Details Patient Name: Date of Service: [name] [name], [place] 5/3/2022andnbsp10:45 A M Medical Record Number: [id number] Patient Account Number: [id number] Date of Birth/Gender: Treating RN: 1961-03-13 (61 y.o. [name] Primary Care Physician: [name] Other Clinician: Referring Physician: Treating Physician/Extender: [name] in Treatment: 2 Education Assessment Education Provided To: Patient Education Topics Provided Venous: Handouts: Managing Venous Disease and Related Ulcers Methods: Explain/Verbal Responses: Reinforcements needed Electronic Signature(s) Signed: 10/14/2020 6:54:10 PM By: Deon Pilling Entered By: Deon Pilling on 10/13/2020 11:14:33 -------------------------------------------------------------------------------- Wound Assessment Details Patient Name: Date of Service: DASHIELL, FRANCHINO 10/13/2020 10:45 A M Medical Record Number: [id number] Patient Account Number: [id number] Date of Birth/Sex: Treating RN: [birth year] (60 y.o. Lorette Ang, Meta.Reding Primary Care Namira Rosekrans: [name] Other Clinician: Referring Khamia Stambaugh: Treating Antoria Lanza/Extender: [name] in Treatment: 2 Wound Status Wound Number: 2 Primary  Diabetic Wound/Ulcer of the Lower Extremity Etiology: Wound Location: Right, Circumferential Lower Leg Wound Open Wounding Event: Blister Status: Date Acquired: 09/10/2020 Comorbid Congestive Heart Failure, Hypertension, Type II Diabetes, Weeks Of Treatment: 2 History: Rheumatoid Arthritis, Neuropathy Clustered Wound: Yes Photos Wound Measurements Length: (cm) 0.1 Width: (cm) 0.1 Depth: (cm) 0.1 Area: (cm) 0.008 Volume: (cm) 0.001 % Reduction in Area: 100% % Reduction in Volume: 100% Epithelialization: Large (67-100%) Tunneling: No Undermining: No Wound Description Classification: Grade 2 Wound Margin: Distinct, outline attached Exudate Amount: None Present Foul Odor After Cleansing: No Slough/Fibrino Yes Wound Bed Granulation Amount: None Present (0%) Exposed Structure Necrotic Amount: None Present (0%) Fascia Exposed: No Fat Layer (Subcutaneous Tissue) Exposed: Yes Tendon Exposed: No Muscle Exposed: No Joint Exposed: No Bone Exposed: No Assessment Notes scabbed over. Treatment Notes Wound #2 (Lower Leg) Wound Laterality: Right, Circumferential Cleanser Soap and Water Discharge Instruction: May shower and wash wound with dial antibacterial soap and water prior to dressing change. Wound Cleanser Discharge Instruction: Cleanse the wound with wound cleanser prior to applying a clean dressing using gauze sponges, not tissue or cotton balls. Peri-Wound Care Triamcinolone 15 (g) Discharge Instruction: Only apply in clinic. Use triamcinolone 15 (g) as directed Sween Lotion (Moisturizing lotion) Discharge Instruction: Apply moisturizing lotion as directed Topical Primary Dressing Promogran Prisma Matrix, 4.34 (sq in) (silver collagen) Discharge Instruction: Moisten collagen with saline or hydrogel Secondary Dressing Woven Gauze Sponge, Non-Sterile 4x4 in Discharge Instruction: Apply over primary dressing as directed. Secured With Compression Wrap Kerlix Roll  4.5x3.1 (in/yd) Discharge Instruction: Apply Kerlix and Coban compression as directed. ***Apply lightly.*** Coban Self-Adherent Wrap 4x5 (in/yd) Discharge Instruction: Apply over Kerlix as directed. Compression Stockings Add-Ons Electronic Signature(s) Signed: 10/13/2020 4:47:39 PM By: Sandre Kitty Signed: 10/14/2020 6:54:10 PM By: Deon Pilling Entered By: Sandre Kitty on 10/13/2020 16:43:59 -------------------------------------------------------------------------------- Wound Assessment Details Patient Name: Date of Service: [name] [name], [place] 10/13/2020 10:45 A M Medical Record Number: [id number] Patient Account Number: [id number] Date of Birth/Sex: Treating RN: 1960/07/11 (60 y.o. [name] Primary Care Hillary Struss: [name] Other Clinician: Referring Johnnetta Holstine: Treating Chanley Mcenery/Extender: [name] in Treatment: 2 Wound Status Wound Number: 4 Primary Diabetic Wound/Ulcer of the Lower Extremity Etiology: Wound Location: Left,  Circumferential Lower Leg Wound Open Wounding Event: Blister Status: Date Acquired: 09/10/2020 Comorbid Congestive Heart Failure, Hypertension, Type II Diabetes, Weeks Of Treatment: 2 History: Rheumatoid Arthritis, Neuropathy Clustered Wound: No Photos Wound Measurements Length: (cm) 0.1 Width: (cm) 0.1 Depth: (cm) 0.1 Area: (cm) 0.008 Volume: (cm) 0.001 % Reduction in Area: 100% % Reduction in Volume: 100% Epithelialization: Large (67-100%) Tunneling: No Undermining: No Wound Description Classification: Grade 2 Wound Margin: Distinct, outline attached Exudate Amount: None Present Foul Odor After Cleansing: No Slough/Fibrino Yes Wound Bed Granulation Amount: None Present (0%) Exposed Structure Necrotic Amount: None Present (0%) Fascia Exposed: No Fat Layer (Subcutaneous Tissue) Exposed: Yes Tendon Exposed: No Muscle Exposed: No Joint Exposed: No Bone Exposed: No Assessment Notes scabbed  over. Treatment Notes Wound #4 (Lower Leg) Wound Laterality: Left, Circumferential Cleanser Soap and Water Discharge Instruction: May shower and wash wound with dial antibacterial soap and water prior to dressing change. Wound Cleanser Discharge Instruction: Cleanse the wound with wound cleanser prior to applying a clean dressing using gauze sponges, not tissue or cotton balls. Peri-Wound Care Triamcinolone 15 (g) Discharge Instruction: Only apply in clinic. Use triamcinolone 15 (g) as directed Sween Lotion (Moisturizing lotion) Discharge Instruction: Apply moisturizing lotion as directed Topical Primary Dressing Promogran Prisma Matrix, 4.34 (sq in) (silver collagen) Discharge Instruction: Moisten collagen with saline or hydrogel Secondary Dressing Woven Gauze Sponge, Non-Sterile 4x4 in Discharge Instruction: Apply over primary dressing as directed. Secured With Compression Wrap Kerlix Roll 4.5x3.1 (in/yd) Discharge Instruction: Apply Kerlix and Coban compression as directed. ***Apply lightly.*** Coban Self-Adherent Wrap 4x5 (in/yd) Discharge Instruction: Apply over Kerlix as directed. Compression Stockings Add-Ons Electronic Signature(s) Signed: 10/13/2020 4:47:39 PM By: Sandre Kitty Signed: 10/14/2020 6:54:10 PM By: Deon Pilling Entered By: Sandre Kitty on 10/13/2020 16:43:41 -------------------------------------------------------------------------------- Wound Assessment Details Patient Name: Date of Service: [name] [name], [place] 10/13/2020 10:45 A M Medical Record Number: [id number] Patient Account Number: [id number] Date of Birth/Sex: Treating RN: Apr 27, 1961 (60 y.o. Lorette Ang, Meta.Reding Primary Care Beretta Ginsberg: [name] Other Clinician: Referring Tommy Minichiello: Treating Shannelle Alguire/Extender: [name] in Treatment: 2 Wound Status Wound Number: 5 Primary Etiology: Diabetic Wound/Ulcer of the Lower Extremity Wound Location: Left, Dorsal Foot Wound  Status: Healed - Epithelialized Wounding Event: Blister Date Acquired: 09/10/2020 Weeks Of Treatment: 2 Clustered Wound: No Wound Measurements Length: (cm) Width: (cm) Depth: (cm) Area: (cm) Volume: (cm) 0 % Reduction in Area: 100% 0 % Reduction in Volume: 100% 0 0 0 Wound Description Classification: Grade 2 Treatment Notes Wound #5 (Foot) Wound Laterality: Dorsal, Left Cleanser Peri-Wound Care Topical Primary Dressing Secondary Dressing Secured With Compression Wrap Compression Stockings Add-Ons Electronic Signature(s) Signed: 10/14/2020 6:54:10 PM By: Deon Pilling Entered By: Deon Pilling on 10/13/2020 11:12:02 -------------------------------------------------------------------------------- Wound Assessment Details Patient Name: Date of Service: TAGGART, PRASAD 10/13/2020 10:45 A M Medical Record Number: [id number] Patient Account Number: [id number] Date of Birth/Sex: Treating RN: July 23, 1960 (60 y.o. Lorette Ang, Meta.Reding Primary Care Aadan Chenier: [name] Other Clinician: Referring Pa Tennant: Treating Oralee Rapaport/Extender: [name] in Treatment: 2 Wound Status Wound Number: 7 Primary Etiology: Diabetic Wound/Ulcer of the Lower Extremity Wound Location: Left, Dorsal T Great oe Wound Status: Healed - Epithelialized Wounding Event: Blister Date Acquired: 09/10/2020 Weeks Of Treatment: 2 Clustered Wound: No Wound Measurements Length: (cm) 0 Width: (cm) 0 Depth: (cm) 0 Area: (cm) 0 Volume: (cm) 0 % Reduction in Area: 100% % Reduction in Volume: 100% Wound Description Classification: Grade 2 Treatment Notes Wound #7 (Toe Great) Wound Laterality: Dorsal, Left Cleanser  Peri-Wound Care Topical Primary Dressing Secondary Dressing Secured With Compression Wrap Compression Stockings Add-Ons Electronic Signature(s) Signed: 10/14/2020 6:54:10 PM By: Deon Pilling Entered By: Deon Pilling on 10/13/2020  11:12:02 -------------------------------------------------------------------------------- Vitals Details Patient Name: Date of Service: BRO WN, [place] 10/13/2020 10:45 A M Medical Record Number: [id number] Patient Account Number: [id number] Date of Birth/Sex: Treating RN: 1960/07/09 (61 y.o. Lorette Ang, Tammi Klippel Primary Care Jaime Grizzell: [name] Other Clinician: Referring Malai Lady: Treating Caedence Snowden/Extender: [name] in Treatment: 2 Vital Signs Time Taken: 11:00 Temperature (F): 97.9 Height (in): 70 Pulse (bpm): 51 Weight (lbs): 148 Respiratory Rate (breaths/min): 18 Body Mass Index (BMI): 21.2 Blood Pressure (mmHg): 142/70 Capillary Blood Glucose (mg/dl): 80 Reference Range: 80 - 120 mg / dl Electronic Signature(s) Signed: 10/14/2020 6:54:10 PM By: Deon Pilling Entered By: Deon Pilling on 10/13/2020 11:02:31

## 2020-10-18 NOTE — Progress Notes (Deleted)
Cardiology Clinic Note   Patient Name: [name] [name] Date of Encounter: 10/18/2020  Primary Care Provider:  [name], MD Primary Cardiologist:  Glenetta Hew, MD  Patient Profile    ***  Past Medical History    Past Medical History:  Diagnosis Date  . Arthritis   . Diabetes mellitus ([place])   . GERD (gastroesophageal reflux disease)   . HTN (hypertension)   . Hyperlipidemia   . Nephrotic syndrome   . Obesity    Past Surgical History:  Procedure Laterality Date  . CATARACT EXTRACTION, BILATERAL  2018  . TONSILLECTOMY      Allergies  No Known Allergies  History of Present Illness    ***  Home Medications    Prior to Admission medications   Medication Sig Start Date End Date Taking? Authorizing Provider  acetaminophen (TYLENOL) 500 MG tablet Take 500 mg by mouth every 6 (six) hours as needed (arthritis).     [provider]  amLODipine (NORVASC) 10 MG tablet Take 1 tablet (10 mg total) by mouth daily. 09/11/20   Dwyane Dee, MD  atorvastatin (LIPITOR) 20 MG tablet TAKE 1 Tablet BY MOUTH ONCE EVERY DAY Patient not taking: Reported on 08/28/2020 06/16/20   Soyla Dryer, PA-C  benzonatate (TESSALON) 100 MG capsule Take 100 mg by mouth 3 (three) times daily as needed for cough. 08/24/20   [provider]  Calcium Carbonate-Simethicone 750-80 MG CHEW Chew 1 tablet by mouth daily as needed (heartburn).    [provider]  famotidine (PEPCID) 20 MG tablet Take 1 tablet (20 mg total) by mouth 2 (two) times daily as needed for heartburn or indigestion. 01/08/18   Soyla Dryer, PA-C  gabapentin (NEURONTIN) 100 MG capsule Take 1 capsule (100 mg total) by mouth daily. 09/10/20   Dwyane Dee, MD  glipiZIDE (GLUCOTROL) 5 MG tablet Take 5 mg by mouth daily. 08/26/20   [provider]  hydrALAZINE (APRESOLINE) 25 MG tablet Take 1 tablet (25 mg total) by mouth every 8 (eight) hours. 09/10/20   Dwyane Dee, MD  isosorbide mononitrate  (IMDUR) 30 MG 24 hr tablet Take 1 tablet (30 mg total) by mouth daily. 09/11/20   Dwyane Dee, MD  loratadine (CLARITIN) 10 MG tablet Take 10 mg by mouth daily. 08/24/20   [provider]  metolazone (ZAROXOLYN) 2.5 MG tablet Take 1 tablet (2.5 mg total) by mouth every other day. 09/10/20   Dwyane Dee, MD  metoprolol tartrate (LOPRESSOR) 50 MG tablet Take 1 tablet (50 mg total) by mouth 2 (two) times daily. 06/16/20   Soyla Dryer, PA-C  Omega-3 Fatty Acids (FISH OIL) 1200 MG CAPS Take 1 capsule (1,200 mg total) by mouth 2 (two) times daily. Patient taking differently: Take 1 capsule by mouth at bedtime. 01/08/18   Soyla Dryer, PA-C  potassium chloride SA (KLOR-CON) 20 MEQ tablet Take 1 tablet (20 mEq total) by mouth every other day. Take on same days with metolazone 09/10/20   Dwyane Dee, MD  torsemide (DEMADEX) 100 MG tablet Take 1 tablet (100 mg total) by mouth daily. 09/11/20   Dwyane Dee, MD    Family History    Family History  Problem Relation Age of Onset  . Hypertension Mother   . Diabetes Mother   . Cancer Father        prostate cancer  . Hypertension Maternal Grandfather   . Rheum arthritis Maternal Grandmother   . CAD Neg Hx    He indicated that his mother is alive.  He indicated that his father is deceased. He indicated that his maternal grandmother is deceased. He indicated that the status of his maternal grandfather is unknown. He indicated that the status of his neg hx is unknown.  Social History    Social History   Socioeconomic History  . Marital status: Single    Spouse name: Not on file  . Number of children: Not on file  . Years of education: Not on file  . Highest education level: Not on file  Occupational History  . Not on file  Tobacco Use  . Smoking status: Never Smoker  . Smokeless tobacco: Never Used  Vaping Use  . Vaping Use: Never used  Substance and Sexual Activity  . Alcohol use: Never  . Drug use: Never  . Sexual  activity: Not on file  Other Topics Concern  . Not on file  Social History Narrative  . Not on file   Social Determinants of Health   Financial Resource Strain: Not on file  Food Insecurity: Not on file  Transportation Needs: Not on file  Physical Activity: Not on file  Stress: Not on file  Social Connections: Not on file  Intimate Partner Violence: Not on file     Review of Systems    General:  No chills, fever, night sweats or weight changes.  Cardiovascular:  No chest pain, dyspnea on exertion, edema, orthopnea, palpitations, paroxysmal nocturnal dyspnea. Dermatological: No rash, lesions/masses Respiratory: No cough, dyspnea Urologic: No hematuria, dysuria Abdominal:   No nausea, vomiting, diarrhea, bright red blood per rectum, melena, or hematemesis Neurologic:  No visual changes, wkns, changes in mental status. All other systems reviewed and are otherwise negative except as noted above.  Physical Exam    VS:  There were no vitals taken for this visit. , BMI There is no height or weight on file to calculate BMI. GEN: Well nourished, well developed, in no acute distress. HEENT: normal. Neck: Supple, no JVD, carotid bruits, or masses. Cardiac: RRR, no murmurs, rubs, or gallops. No clubbing, cyanosis, edema.  Radials/DP/PT 2+ and equal bilaterally.  Respiratory:  Respirations regular and unlabored, clear to auscultation bilaterally. GI: Soft, nontender, nondistended, BS + x 4. MS: no deformity or atrophy. Skin: warm and dry, no rash. Neuro:  Strength and sensation are intact. Psych: Normal affect.  Accessory Clinical Findings    Recent Labs: 08/28/2020: B Natriuretic Peptide 2,878.0 08/29/2020: ALT 19 09/10/2020: BUN 43; Creatinine, Ser 2.63; Hemoglobin 8.2; Magnesium 2.0; Platelets 243; Potassium 3.7; Sodium 129   Recent Lipid Panel    Component Value Date/Time   CHOL 194 04/27/2020 1118   TRIG 228 (H) 04/27/2020 1118   HDL 46 04/27/2020 1118   CHOLHDL 4.2  04/27/2020 1118   VLDL 46 (H) 04/27/2020 1118   LDLCALC 102 (H) 04/27/2020 1118    ECG personally reviewed by me today- *** - No acute changes  Assessment & Plan   1.  ***   Jossie Ng. Dagan Heinz NP-C    10/18/2020, 3:15 PM [place] [address] Office [phone] Fax [phone]  Notice: This dictation was prepared with Dragon dictation along with smaller phrase technology. Any transcriptional errors that result from this process are unintentional and may not be corrected upon review.  I spent***minutes examining this patient, reviewing medications, and using patient centered shared decision making involving her cardiac care.  Prior to her visit I spent greater than 20 minutes reviewing her past medical history,  medications, and  prior cardiac tests.

## 2020-10-20 ENCOUNTER — Other Ambulatory Visit: Payer: Self-pay

## 2020-10-20 ENCOUNTER — Encounter (HOSPITAL_BASED_OUTPATIENT_CLINIC_OR_DEPARTMENT_OTHER): Payer: Medicare HMO | Admitting: Internal Medicine

## 2020-10-20 DIAGNOSIS — E1165 Type 2 diabetes mellitus with hyperglycemia: Secondary | ICD-10-CM | POA: Diagnosis not present

## 2020-10-20 DIAGNOSIS — L97819 Non-pressure chronic ulcer of other part of right lower leg with unspecified severity: Secondary | ICD-10-CM | POA: Diagnosis not present

## 2020-10-20 DIAGNOSIS — L97829 Non-pressure chronic ulcer of other part of left lower leg with unspecified severity: Secondary | ICD-10-CM | POA: Diagnosis not present

## 2020-10-20 DIAGNOSIS — E11622 Type 2 diabetes mellitus with other skin ulcer: Secondary | ICD-10-CM | POA: Diagnosis not present

## 2020-10-20 DIAGNOSIS — I509 Heart failure, unspecified: Secondary | ICD-10-CM | POA: Diagnosis not present

## 2020-10-20 DIAGNOSIS — L97812 Non-pressure chronic ulcer of other part of right lower leg with fat layer exposed: Secondary | ICD-10-CM | POA: Diagnosis not present

## 2020-10-20 DIAGNOSIS — E1121 Type 2 diabetes mellitus with diabetic nephropathy: Secondary | ICD-10-CM | POA: Diagnosis not present

## 2020-10-20 DIAGNOSIS — L97822 Non-pressure chronic ulcer of other part of left lower leg with fat layer exposed: Secondary | ICD-10-CM | POA: Diagnosis not present

## 2020-10-20 DIAGNOSIS — E1140 Type 2 diabetes mellitus with diabetic neuropathy, unspecified: Secondary | ICD-10-CM | POA: Diagnosis not present

## 2020-10-20 DIAGNOSIS — I110 Hypertensive heart disease with heart failure: Secondary | ICD-10-CM | POA: Diagnosis not present

## 2020-10-20 DIAGNOSIS — M069 Rheumatoid arthritis, unspecified: Secondary | ICD-10-CM | POA: Diagnosis not present

## 2020-10-20 NOTE — Progress Notes (Signed)
[name], [name] ([id number]) Visit Report for 10/06/2020 Chief Complaint Document Details Patient Name: Date of Service: [name] [name], [name] [name] 10/06/2020 10:15 A M Medical Record Number: [id number] Patient Account Number: [id number] Date of Birth/Sex: Treating RN: 1960/07/01 (60 y.o. [name] [name] Primary Care Provider: [name] Other Clinician: Referring Provider: Treating Provider/Extender: [name] in Treatment: 1 Information Obtained from: Patient Chief Complaint Bilateral lower extremity wounds Electronic Signature(s) Signed: 10/06/2020 11:22:40 AM By: Kalman Shan DO Entered By: Kalman Shan on 10/06/2020 11:16:53 -------------------------------------------------------------------------------- Debridement Details Patient Name: Date of Service: [name] [name], [name] [name] 10/06/2020 10:15 A M Medical Record Number: [id number] Patient Account Number: [id number] Date of Birth/Sex: Treating RN: 1960/12/28 (60 y.o. [name] [name] Primary Care Provider: [name] Other Clinician: Referring Provider: Treating Provider/Extender: [name] in Treatment: 1 Debridement Performed for Assessment: Wound #5 Left,Dorsal Foot Performed By: Physician Kalman Shan, DO Debridement Type: Debridement Severity of Tissue Pre Debridement: Limited to breakdown of skin Level of Consciousness (Pre-procedure): Awake and Alert Pre-procedure Verification/Time Out Yes - 11:05 Taken: Start Time: 11:06 T Area Debrided (L x W): otal 5 (cm) x 6 (cm) = 30 (cm) Tissue and other material debrided: Non-Viable, Skin: Epidermis Level: Skin/Epidermis Debridement Description: Selective/Open Wound Instrument: Forceps, Scissors Bleeding: None End Time: 11:12 Procedural Pain: 0 Post Procedural Pain: 0 Response to Treatment: Procedure was tolerated well Level of Consciousness (Post- Awake and Alert procedure): Post Debridement Measurements of Total  Wound Length: (cm) 0.1 Width: (cm) 0.1 Depth: (cm) 0.1 Volume: (cm) 0.001 Character of Wound/Ulcer Post Debridement: Improved Severity of Tissue Post Debridement: Limited to breakdown of skin Post Procedure Diagnosis Same as Pre-procedure Electronic Signature(s) Signed: 10/06/2020 11:22:40 AM By: Kalman Shan DO Signed: 10/06/2020 6:28:10 PM By: Baruch Gouty RN, BSN Entered By: Baruch Gouty on 10/06/2020 11:13:11 -------------------------------------------------------------------------------- Debridement Details Patient Name: Date of Service: [name] [name], [name] [name] 10/06/2020 10:15 A M Medical Record Number: [id number] Patient Account Number: [id number] Date of Birth/Sex: Treating RN: Jul 17, 1960 (60 y.o. [name] [name] Primary Care Provider: [name] Other Clinician: Referring Provider: Treating Provider/Extender: [name] in Treatment: 1 Debridement Performed for Assessment: Wound #7 Left,Dorsal T Great oe Performed By: Physician Kalman Shan, DO Debridement Type: Debridement Severity of Tissue Pre Debridement: Limited to breakdown of skin Level of Consciousness (Pre-procedure): Awake and Alert Pre-procedure Verification/Time Out Yes - 11:05 Taken: Start Time: 11:06 T Area Debrided (L x W): otal 2.5 (cm) x 2.5 (cm) = 6.25 (cm) Tissue and other material debrided: Non-Viable, Skin: Epidermis Level: Skin/Epidermis Debridement Description: Selective/Open Wound Instrument: Forceps, Scissors Bleeding: None End Time: 11:12 Procedural Pain: 0 Post Procedural Pain: 0 Response to Treatment: Procedure was tolerated well Level of Consciousness (Post- Awake and Alert procedure): Post Debridement Measurements of Total Wound Length: (cm) 0.1 Width: (cm) 0.1 Depth: (cm) 0.1 Volume: (cm) 0.001 Character of Wound/Ulcer Post Debridement: Improved Severity of Tissue Post Debridement: Limited to breakdown of skin Post Procedure  Diagnosis Same as Pre-procedure Electronic Signature(s) Signed: 10/06/2020 11:22:40 AM By: Kalman Shan DO Signed: 10/06/2020 6:28:10 PM By: Baruch Gouty RN, BSN Entered By: Baruch Gouty on 10/06/2020 11:14:03 -------------------------------------------------------------------------------- HPI Details Patient Name: Date of Service: [name] [name], [name] [name] 10/06/2020 10:15 A M Medical Record Number: [id number] Patient Account Number: [id number] Date of Birth/Sex: Treating RN: July 26, 1960 (60 y.o. [name] [name] Primary Care Provider: [name] Other Clinician: Referring Provider: Treating Provider/Extender: [name] in Treatment: 1 History of Present Illness HPI Description: [name] [name] is a  60 year old male with a past medical history of type 2 diabetes, nephrotic syndorme, essential hypertension that presents today with bilateral lower extremity wounds. 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 He is currently taking torsemide and metolazone. During his hospital stay he had compression wraps placed to his legs bilaterally. He was discharged on 3/31 and the wraps were removed about 1 week ago. He had blister formation throughout his legs and these have almost all opened. He has skin breakdown with serous drainage scattered throughout his legs bilaterally. He currently does not do anything for dressing changes. He states that for the past week these have remained stable. He denies fever/chills or increased warmth or erythema to the lower extremities bilaterally. 4/22; patient presents for 1 week follow-up of bilateral lower extremity wounds. He had wraps placed in our clinic last week with silver alginate underneath. Today the wounds appear healing and some have closed. I would like to have a dressing done every day however the patient nor the patient's wife can do  this. Overall patient states he is doing well 4/26; patient presents for 1 week follow-up of bilateral upper lower extremity wounds. He reports no issues today. He denies any pain. He states that the wrap stayed in place over the past week. Electronic Signature(s) Signed: 10/06/2020 11:22:40 AM By: Kalman Shan DO Entered By: Kalman Shan on 10/06/2020 11:20:44 -------------------------------------------------------------------------------- Physical Exam Details Patient Name: Date of Service: [name] [name], [place] 10/06/2020 10:15 A M Medical Record Number: [id number] Patient Account Number: [id number] Date of Birth/Sex: Treating RN: 12-18-1960 (60 y.o. [name] [name] Primary Care Provider: [name] Other Clinician: Referring Provider: Treating Provider/Extender: [name] in Treatment: 1 Constitutional respirations regular, non-labored and within target range for patient.. Cardiovascular 2+ dorsalis pedis/posterior tibialis pulses. Psychiatric pleasant and cooperative. Notes Lower extremities bilaterally: Open wounds limited to skin breakdown. Continued improvement compared to last clinic visit. No blisters noted. No signs of infection. Electronic Signature(s) Signed: 10/06/2020 11:22:40 AM By: Kalman Shan DO Entered By: Kalman Shan on 10/06/2020 11:18:08 -------------------------------------------------------------------------------- Physician Orders Details Patient Name: Date of Service: [id number] [name], [place] 10/06/2020 10:15 A M Medical Record Number: [id number] Patient Account Number: [id number] Date of Birth/Sex: Treating RN: 30-Nov-1960 (60 y.o. 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 Bathing/ Shower/ Hygiene May shower with protection but do not get wound dressing(s) wet. - May use cast wraps from [facility] or [facility]. Edema Control - Lymphedema / SCD / Other Elevate legs to the level of the heart or above for 30 minutes daily and/or when sitting, a frequency of: Avoid standing for long periods of time. Non Wound Condition pply the following to affected area as directed: - xeroform to right dorsal foot with dressing changes A Wound Treatment Wound #2 - Lower Leg Wound Laterality: Right, Circumferential Cleanser: Soap and Water 1 x Per Week/15 Days Discharge Instructions: May shower and wash wound with dial antibacterial soap and water prior to dressing change. Cleanser: Wound Cleanser 1  x Per Week/15 Days Discharge Instructions: Cleanse the wound with wound cleanser prior to applying a clean dressing using gauze sponges, not tissue or cotton balls. Peri-Wound Care: Triamcinolone 15 (g) 1 x Per Week/15 Days Discharge Instructions: Use triamcinolone 15 (g) as directed Peri-Wound Care: Sween Lotion (Moisturizing lotion) 1 x Per Week/15 Days Discharge Instructions: Apply moisturizing lotion as directed Prim Dressing: KerraCel Ag Gelling Fiber Dressing, 4x5 in (silver alginate) 1 x Per Week/15 Days ary Discharge Instructions: Apply silver alginate to wound bed as instructed Secondary Dressing: Woven Gauze Sponge, Non-Sterile 4x4 in 1 x Per Week/15 Days Discharge Instructions: Apply over primary dressing as directed. Secondary Dressing: ABD Pad, 5x9 1 x Per Week/15 Days Discharge Instructions: Apply over primary dressing as  directed. Compression Wrap: Kerlix Roll 4.5x3.1 (in/yd) 1 x Per Week/15 Days Discharge Instructions: Apply Kerlix and Coban compression as directed. Compression Wrap: Coban Self-Adherent Wrap 4x5 (in/yd) 1 x Per Week/15 Days Discharge Instructions: Apply over Kerlix as directed. Wound #4 - Lower Leg Wound Laterality: Left, Circumferential Cleanser: Soap and Water 1 x Per Week/15 Days Discharge Instructions: May shower and wash wound with dial antibacterial soap and water prior to dressing change. Cleanser: Wound Cleanser 1 x Per Week/15 Days Discharge Instructions: Cleanse the wound with wound cleanser prior to applying a clean dressing using gauze sponges, not tissue or cotton balls. Peri-Wound Care: Triamcinolone 15 (g) 1 x Per Week/15 Days Discharge Instructions: Use triamcinolone 15 (g) as directed Peri-Wound Care: Sween Lotion (Moisturizing lotion) 1 x Per Week/15 Days Discharge Instructions: Apply moisturizing lotion as directed Prim Dressing: KerraCel Ag Gelling Fiber Dressing, 4x5 in (silver alginate) 1 x Per Week/15 Days ary Discharge Instructions: Apply silver alginate to wound bed as instructed Secondary Dressing: Woven Gauze Sponge, Non-Sterile 4x4 in 1 x Per Week/15 Days Discharge Instructions: Apply over primary dressing as directed. Secondary Dressing: ABD Pad, 5x9 1 x Per Week/15 Days Discharge Instructions: Apply over primary dressing as directed. Compression Wrap: Kerlix Roll 4.5x3.1 (in/yd) 1 x Per Week/15 Days Discharge Instructions: Apply Kerlix and Coban compression as directed. Compression Wrap: Coban Self-Adherent Wrap 4x5 (in/yd) 1 x Per Week/15 Days Discharge Instructions: Apply over Kerlix as directed. Wound #5 - Foot Wound Laterality: Dorsal, Left Cleanser: Soap and Water 1 x Per Week/15 Days Discharge Instructions: May shower and wash wound with dial antibacterial soap and water prior to dressing change. Cleanser: Wound Cleanser 1 x Per Week/15 Days Discharge  Instructions: Cleanse the wound with wound cleanser prior to applying a clean dressing using gauze sponges, not tissue or cotton balls. Peri-Wound Care: Sween Lotion (Moisturizing lotion) 1 x Per Week/15 Days Discharge Instructions: Apply moisturizing lotion as directed Prim Dressing: KerraCel Ag Gelling Fiber Dressing, 4x5 in (silver alginate) 1 x Per Week/15 Days ary Discharge Instructions: Apply silver alginate to wound bed as instructed Secondary Dressing: Woven Gauze Sponge, Non-Sterile 4x4 in 1 x Per Week/15 Days Discharge Instructions: Apply over primary dressing as directed. Compression Wrap: Kerlix Roll 4.5x3.1 (in/yd) 1 x Per Week/15 Days Discharge Instructions: Apply Kerlix and Coban compression as directed. Compression Wrap: Coban Self-Adherent Wrap 4x5 (in/yd) 1 x Per Week/15 Days Discharge Instructions: Apply over Kerlix as directed. Wound #7 - T Great oe Wound Laterality: Dorsal, Left Cleanser: Soap and Water 1 x Per Week/15 Days Discharge Instructions: May shower and wash wound with dial antibacterial soap and water prior to dressing change. Cleanser: Wound Cleanser 1 x Per Week/15 Days Discharge Instructions: Cleanse the wound with wound cleanser prior to applying a  clean dressing using gauze sponges, not tissue or cotton balls. Peri-Wound Care: Sween Lotion (Moisturizing lotion) 1 x Per Week/15 Days Discharge Instructions: Apply moisturizing lotion as directed Prim Dressing: KerraCel Ag Gelling Fiber Dressing, 4x5 in (silver alginate) 1 x Per Week/15 Days ary Discharge Instructions: Apply silver alginate to wound bed as instructed Secondary Dressing: Woven Gauze Sponge, Non-Sterile 4x4 in 1 x Per Week/15 Days Discharge Instructions: Apply over primary dressing as directed. Compression Wrap: Kerlix Roll 4.5x3.1 (in/yd) 1 x Per Week/15 Days Discharge Instructions: Apply Kerlix and Coban compression as directed. Compression Wrap: Coban Self-Adherent Wrap 4x5 (in/yd) 1 x Per  Week/15 Days Discharge Instructions: Apply over Kerlix as directed. Electronic Signature(s) Signed: 10/06/2020 6:28:10 PM By: Baruch Gouty RN, BSN Signed: 10/20/2020 3:23:56 PM By: Kalman Shan DO Entered By: Baruch Gouty on 10/06/2020 12:21:06 -------------------------------------------------------------------------------- Problem List Details Patient Name: Date of Service: [name] [name], [place] 10/06/2020 10:15 A M Medical Record Number: [id number] Patient Account Number: [id number] Date of Birth/Sex: Treating RN: 1960-11-24 (60 y.o. 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 with unspecified 09/25/2020 No Yes severity L97.829 Non-pressure chronic ulcer of other part of left lower leg with unspecified 09/25/2020 No Yes severity E11.65 Type 2 diabetes mellitus with hyperglycemia 09/25/2020 No Yes N04.9 Nephrotic syndrome with unspecified morphologic changes 09/25/2020 No Yes I10 Essential (primary) hypertension 09/25/2020 No Yes Inactive Problems Resolved Problems Electronic Signature(s) Signed: 10/06/2020 11:22:40 AM By: Kalman Shan DO Entered By: Kalman Shan on 10/06/2020 11:16:28 -------------------------------------------------------------------------------- Progress Note Details Patient Name: Date of Service: [id number] [place], [place] 10/06/2020 10:15 A M Medical Record Number: [id number] Patient Account Number: [id number] Date of Birth/Sex: Treating RN: 1960/06/17 (60 y.o. [name] [name] Primary Care Provider: [name] Other Clinician: Referring Provider: Treating Provider/Extender: [name] in Treatment: 1 Subjective Chief Complaint Information obtained from  Patient Bilateral lower extremity wounds History of Present Illness (HPI) Mr. [name] [name] is a 60 year old male with a past medical history of type 2 diabetes, nephrotic syndorme, essential hypertension that presents today with bilateral lower extremity wounds. 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 He is currently taking torsemide and metolazone. During his hospital stay he had compression wraps placed to his legs bilaterally. He was discharged on 3/31 and the wraps were removed about 1 week ago. He had blister formation throughout his legs and these have almost all opened. He has skin breakdown with serous drainage scattered throughout his legs bilaterally. He currently does not do anything for dressing changes. He states that for the past week these have remained stable. He denies fever/chills or increased warmth or erythema to the lower extremities bilaterally. 4/22; patient presents for 1 week follow-up of bilateral lower extremity wounds. He had wraps placed in our clinic last week with silver alginate underneath. Today the wounds appear healing and some have closed. I would like to have a dressing done every day however the patient nor the patient's wife can do this. Overall patient states he is doing well 4/26; patient presents for 1 week follow-up of bilateral upper lower extremity wounds. He reports no issues today. He denies any pain. He states that the wrap stayed in place over the past week. Patient History Information obtained from Patient. Family History  Cancer - Father, Diabetes - Mother, Hypertension - Mother, No family history of Heart Disease, Hereditary Spherocytosis, Kidney Disease, Lung Disease, Seizures, Stroke, Thyroid Problems, Tuberculosis. Social History Never smoker, Marital Status - Single, Alcohol Use - Never, Drug Use - No History, Caffeine Use - Never. Medical History Eyes Denies  history of Cataracts, Glaucoma, Optic Neuritis Ear/Nose/Mouth/Throat Denies history of Chronic sinus problems/congestion, Middle ear problems Hematologic/Lymphatic Denies history of Anemia, Hemophilia, Human Immunodeficiency Virus, Lymphedema, Sickle Cell Disease Respiratory Denies history of Aspiration, Asthma, Chronic Obstructive Pulmonary Disease (COPD), Pneumothorax, Sleep Apnea, Tuberculosis Cardiovascular Patient has history of Congestive Heart Failure, Hypertension Denies history of Angina, Arrhythmia, Coronary Artery Disease, Deep Vein Thrombosis, Hypotension, Myocardial Infarction, Peripheral Arterial Disease, Peripheral Venous Disease, Phlebitis, Vasculitis Gastrointestinal Denies history of Cirrhosis , Colitis, Crohnoos, Hepatitis A, Hepatitis B, Hepatitis C Endocrine Patient has history of Type II Diabetes Denies history of Type I Diabetes Immunological Denies history of Lupus Erythematosus, Raynaudoos, Scleroderma Integumentary (Skin) Denies history of History of Burn Musculoskeletal Patient has history of Rheumatoid Arthritis Denies history of Gout, Osteoarthritis, Osteomyelitis Neurologic Patient has history of Neuropathy Denies history of Dementia, Quadriplegia, Paraplegia, Seizure Disorder Oncologic Denies history of Received Chemotherapy, Received Radiation Psychiatric Denies history of Anorexia/bulimia, Confinement Anxiety Hospitalization/Surgery History - cataract extraction bilateral. - tonsillectomy. Medical A Surgical History Notes nd Gastrointestinal GERD Objective Constitutional respirations regular, non-labored and within target range for patient.. Vitals Time Taken: 10:33 AM, Height: 70 in, Weight: 148 lbs, BMI: 21.2, Temperature: 97.7 F, Pulse: 51 bpm, Respiratory Rate: 16 breaths/min, Blood Pressure: 124/62 mmHg, Capillary Blood Glucose: 95 mg/dl. Cardiovascular 2+ dorsalis pedis/posterior tibialis pulses. Psychiatric pleasant and  cooperative. General Notes: Lower extremities bilaterally: Open wounds limited to skin breakdown. Continued improvement compared to last clinic visit. No blisters noted. No signs of infection. Integumentary (Hair, Skin) Wound #2 status is Open. Original cause of wound was Blister. The date acquired was: 09/10/2020. The wound has been in treatment 1 weeks. The wound is located on the Right,Circumferential Lower Leg. The wound measures 12cm length x 9cm width x 0.1cm depth; 84.823cm^2 area and 8.482cm^3 volume. There is Fat Layer (Subcutaneous Tissue) exposed. There is no tunneling or undermining noted. There is a large amount of purulent drainage noted. The wound margin is distinct with the outline attached to the wound base. There is large (67-100%) red, pink granulation within the wound bed. There is a small (1-33%) amount of necrotic tissue within the wound bed including Adherent Slough. Wound #4 status is Open. Original cause of wound was Blister. The date acquired was: 09/10/2020. The wound has been in treatment 1 weeks. The wound is located on the Left,Circumferential Lower Leg. The wound measures 15cm length x 16cm width x 0.1cm depth; 188.496cm^2 area and 18.85cm^3 volume. There is Fat Layer (Subcutaneous Tissue) exposed. There is no tunneling or undermining noted. There is a large amount of purulent drainage noted. Foul odor after cleansing was noted. The wound margin is distinct with the outline attached to the wound base. There is large (67-100%) red, pink granulation within the wound bed. There is a small (1-33%) amount of necrotic tissue within the wound bed including Adherent Slough. Wound #5 status is Open. Original cause of wound was Blister. The date acquired was: 09/10/2020. The wound has been in treatment 1 weeks. The wound is located on the Left,Dorsal Foot. The wound measures 0cm length x 0cm width x 0cm depth; 0cm^2 area and 0cm^3 volume. Wound #7 status is Open. Original cause of  wound was  Blister. The date acquired was: 09/10/2020. The wound has been in treatment 1 weeks. The wound is located on the Left,Dorsal T Great. The wound measures 0.2cm length x 0.2cm width x 0cm depth; 0.031cm^2 area and 0.003cm^3 volume. There is Fat oe Layer (Subcutaneous Tissue) exposed. There is no tunneling or undermining noted. There is a none present amount of drainage noted. The wound margin is distinct with the outline attached to the wound base. There is medium (34-66%) pink granulation within the wound bed. There is no necrotic tissue within the wound bed. 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 We are unable to obtain home health and have tried two agencies but they state due to staffing are unable to see the patient. Since the patient cannot do daily dressing changes I will continue doing silver alginate under Kerlix/Coban and see him in follow-up in 1 week. Overall he is progressing nicely. Procedures Wound #5 Pre-procedure diagnosis of Wound #5 is a Diabetic Wound/Ulcer of the Lower Extremity located on the Left,Dorsal Foot .Severity of Tissue Pre Debridement is: Limited to breakdown of skin. There was a Selective/Open Wound Skin/Epidermis Debridement with a total area of 30 sq cm performed by Kalman Shan, DO. With the following instrument(s): Forceps, and Scissors to remove Non-Viable tissue/material. Material removed includes Skin: Epidermis. No specimens were taken. A time out was conducted at 11:05, prior to the start of the procedure. There was no bleeding. The procedure was tolerated well with a pain level of 0  throughout and a pain level of 0 following the procedure. Post Debridement Measurements: 0.1cm length x 0.1cm width x 0.1cm depth; 0.001cm^3 volume. Character of Wound/Ulcer Post Debridement is improved. Severity of Tissue Post Debridement is: Limited to breakdown of skin. Post procedure Diagnosis Wound #5: Same as Pre-Procedure Wound #7 Pre-procedure diagnosis of Wound #7 is a Diabetic Wound/Ulcer of the Lower Extremity located on the Left,Dorsal T Great .Severity of Tissue Pre oe Debridement is: Limited to breakdown of skin. There was a Selective/Open Wound Skin/Epidermis Debridement with a total area of 6.25 sq cm performed by Kalman Shan, DO. With the following instrument(s): Forceps, and Scissors to remove Non-Viable tissue/material. Material removed includes Skin: Epidermis. No specimens were taken. A time out was conducted at 11:05, prior to the start of the procedure. There was no bleeding. The procedure was tolerated well with a pain level of 0 throughout and a pain level of 0 following the procedure. Post Debridement Measurements: 0.1cm length x 0.1cm width x 0.1cm depth; 0.001cm^3 volume. Character of Wound/Ulcer Post Debridement is improved. Severity of Tissue Post Debridement is: Limited to breakdown of skin. Post procedure Diagnosis Wound #7: Same as Pre-Procedure Plan Follow-up Appointments: Return Appointment in 1 week. - Dr. He[place] Bathing/ Shower/ Hygiene: May shower with protection but do not get wound dressing(s) wet. - May use cast protectors from [facility] or [facility]. Edema Control - Lymphedema / SCD / Other: Elevate legs to the level of the heart or above for 30 minutes daily and/or when sitting, a frequency of: Avoid standing for long periods of time. Exercise regularly Additional Orders / Instructions: Follow Nutritious Diet Home Health: Admit to Home Health for wound care. May utilize formulary equivalent dressing for wound treatment orders unless  otherwise  specified. - Wound Care 2x week 1. Silver alginate under Kerlix/Coban 2. Follow-up in 1 week Electronic Signature(s) Signed: 10/06/2020 11:22:40 AM By: Kalman Shan DO Signed: 10/06/2020 11:22:40 AM By: Kalman Shan DO Entered By: Kalman Shan on 10/06/2020 11:21:13 -------------------------------------------------------------------------------- HxROS Details Patient Name: Date of Service: BRO [place], [place] 10/06/2020 10:15 A M Medical Record Number: [id number] Patient Account Number: [id number] Date of Birth/Sex: Treating RN: Aug 31, 1960 (60 y.o. [name] [name] Primary Care Provider: [name] Other Clinician: Referring Provider: Treating Provider/Extender: [name] in Treatment: 1 Information Obtained From Patient Eyes Medical History: Negative for: Cataracts; Glaucoma; Optic Neuritis Ear/Nose/Mouth/Throat Medical History: Negative for: Chronic sinus problems/congestion; Middle ear problems Hematologic/Lymphatic Medical History: Negative for: Anemia; Hemophilia; Human Immunodeficiency Virus; Lymphedema; Sickle Cell Disease Respiratory Medical History: Negative for: Aspiration; Asthma; Chronic Obstructive Pulmonary Disease (COPD); Pneumothorax; Sleep Apnea; Tuberculosis Cardiovascular Medical History: Positive for: Congestive Heart Failure; Hypertension Negative for: Angina; Arrhythmia; Coronary Artery Disease; Deep Vein Thrombosis; Hypotension; Myocardial Infarction; Peripheral Arterial Disease; Peripheral Venous Disease; Phlebitis; Vasculitis Gastrointestinal Medical History: Negative for: Cirrhosis ; Colitis; Crohns; Hepatitis A; Hepatitis B; Hepatitis C Past Medical History Notes: GERD Endocrine Medical History: Positive for: Type II Diabetes Negative for: Type I Diabetes Time with diabetes: 2 years Treated with: Oral agents Blood sugar tested every day: Yes Tested : Immunological Medical History: Negative for: Lupus  Erythematosus; Raynauds; Scleroderma Integumentary (Skin) Medical History: Negative for: History of Burn Musculoskeletal Medical History: Positive for: Rheumatoid Arthritis Negative for: Gout; Osteoarthritis; Osteomyelitis Neurologic Medical History: Positive for: Neuropathy Negative for: Dementia; Quadriplegia; Paraplegia; Seizure Disorder Oncologic Medical History: Negative for: Received Chemotherapy; Received Radiation Psychiatric Medical History: Negative for: Anorexia/bulimia; Confinement Anxiety Immunizations Pneumococcal Vaccine: Received Pneumococcal Vaccination: No Implantable Devices None Hospitalization / Surgery History Type of Hospitalization/Surgery cataract extraction bilateral tonsillectomy Family and Social History Cancer: Yes - Father; Diabetes: Yes - Mother; Heart Disease: No; Hereditary Spherocytosis: No; Hypertension: Yes - Mother; Kidney Disease: No; Lung Disease: No; Seizures: No; Stroke: No; Thyroid Problems: No; Tuberculosis: No; Never smoker; Marital Status - Single; Alcohol Use: Never; Drug Use: No History; Caffeine Use: Never; Financial Concerns: No; Food, Clothing or Shelter Needs: No; Support System Lacking: No; Transportation Concerns: No Electronic Signature(s) Signed: 10/06/2020 11:22:40 AM By: Kalman Shan DO Signed: 10/06/2020 6:28:10 PM By: Baruch Gouty RN, BSN Entered By: Kalman Shan on 10/06/2020 11:17:32 -------------------------------------------------------------------------------- SuperBill Details Patient Name: Date of Service: [name] [name], [place] 10/06/2020 Medical Record Number: [id number] Patient Account Number: [id number] Date of Birth/Sex: Treating RN: [birth year] (60 y.o. [name] [name] Primary Care Provider: [name] Other Clinician: Referring Provider: Treating Provider/Extender: [name] in Treatment: 1 Diagnosis Coding ICD-10 Codes Code Description [phone] Non-pressure  chronic ulcer of other part of right lower leg with unspecified severity L97.829 Non-pressure chronic ulcer of other part of left lower leg with unspecified severity E11.65 Type 2 diabetes mellitus with hyperglycemia N04.9 Nephrotic syndrome with unspecified morphologic changes I10 Essential (primary) hypertension Facility Procedures CPT4 Code: [id number] IC L Description: [id number] - DEBRIDE WOUND 1ST 20 SQ CM OR < D-10 Diagnosis Description 97.829 Non-pressure chronic 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

## 2020-10-20 NOTE — Progress Notes (Signed)
[name] [name] ([id number]) Visit Report for 10/13/2020 Chief Complaint Document Details Patient Name: Date of Service: [name] [name] 10/13/2020 10:45 A M Medical Record Number: [id number] Patient Account Number: [id number] Date of Birth/Sex: Treating RN: Oct 09, 1960 (60 y.o. [name] [name], [name] [name] Primary Care Provider: [name] Other Clinician: Referring Provider: Treating Provider/Extender: [name] in Treatment: 2 Information Obtained from: Patient Chief Complaint Bilateral lower extremity wounds Electronic Signature(s) Signed: 10/13/2020 11:36:44 AM By: Kalman Shan DO Entered By: Kalman Shan on 10/13/2020 11:30:51 -------------------------------------------------------------------------------- Debridement Details Patient Name: Date of Service: [name] [name], [name] [name] 10/13/2020 10:45 A M Medical Record Number: [id number] Patient Account Number: [id number] Date of Birth/Sex: Treating RN: 09-17-1960 (60 y.o. [name] Primary Care Provider: [name] Other Clinician: Referring Provider: Treating Provider/Extender: [name] in Treatment: 2 Debridement Performed for Assessment: Wound #2 Right,Circumferential Lower Leg Performed By: Physician Kalman Shan, DO Debridement Type: Debridement Severity of Tissue Pre Debridement: Limited to breakdown of skin Level of Consciousness (Pre-procedure): Awake and Alert Pre-procedure Verification/Time Out Yes - 11:15 Taken: Start Time: 11:16 Pain Control: Lidocaine 4% Topical Solution T Area Debrided (L x W): otal 3 (cm) x 1 (cm) = 3 (cm) Tissue and other material debrided: Non-Viable, Eschar Level: Non-Viable Tissue Debridement Description: Selective/Open Wound Instrument: Curette Bleeding: Moderate Hemostasis Achieved: Pressure End Time: 11:19 Procedural Pain: 0 Post Procedural Pain: 3 Response to Treatment: Procedure was tolerated well Level of Consciousness  (Post- Awake and Alert procedure): Post Debridement Measurements of Total Wound Length: (cm) 3 Width: (cm) 0.5 Depth: (cm) 0.1 Volume: (cm) 0.118 Character of Wound/Ulcer Post Debridement: Improved Severity of Tissue Post Debridement: Fat layer exposed Post Procedure Diagnosis Same as Pre-procedure Electronic Signature(s) Signed: 10/13/2020 11:36:44 AM By: Kalman Shan DO Signed: 10/14/2020 6:54:10 PM By: Deon Pilling Entered By: Deon Pilling on 10/13/2020 11:19:42 -------------------------------------------------------------------------------- Debridement Details Patient Name: Date of Service: [name] [name], [name] [name] 10/13/2020 10:45 A M Medical Record Number: [id number] Patient Account Number: [id number] Date of Birth/Sex: Treating RN: 1961-04-19 (60 y.o. [name] Primary Care Provider: [name] Other Clinician: Referring Provider: Treating Provider/Extender: [name] in Treatment: 2 Debridement Performed for Assessment: Wound #4 Left,Circumferential Lower Leg Performed By: Physician Kalman Shan, DO Debridement Type: Debridement Severity of Tissue Pre Debridement: Limited to breakdown of skin Level of Consciousness (Pre-procedure): Awake and Alert Pre-procedure Verification/Time Out Yes - 11:15 Taken: Start Time: 11:16 Pain Control: Lidocaine 4% Topical Solution T Area Debrided (L x W): otal 3 (cm) x 2 (cm) = 6 (cm) Tissue and other material debrided: Non-Viable, Eschar, Skin: Epidermis Level: Skin/Epidermis Debridement Description: Selective/Open Wound Instrument: Curette Bleeding: Moderate Hemostasis Achieved: Pressure End Time: 11:19 Procedural Pain: 0 Post Procedural Pain: 3 Response to Treatment: Procedure was tolerated well Level of Consciousness (Post- Awake and Alert procedure): Post Debridement Measurements of Total Wound Length: (cm) 3 Width: (cm) 2 Depth: (cm) 0.1 Volume: (cm) 0.471 Character of Wound/Ulcer  Post Debridement: Improved Severity of Tissue Post Debridement: Limited to breakdown of skin Post Procedure Diagnosis Same as Pre-procedure Electronic Signature(s) Signed: 10/13/2020 11:36:44 AM By: Kalman Shan DO Signed: 10/14/2020 6:54:10 PM By: Deon Pilling Entered By: Deon Pilling on 10/13/2020 11:24:31 -------------------------------------------------------------------------------- HPI Details Patient Name: Date of Service: [name] [name], [name] [name] 10/13/2020 10:45 A M Medical Record Number: [id number] Patient Account Number: [id number] Date of Birth/Sex: Treating RN: 11-Sep-1960 (60 y.o. [name] [name], [name] [name] Primary Care Provider: [name] Other Clinician: Referring Provider: Treating Provider/Extender: [name] in Treatment: 2  History of Present Illness HPI Description: Mr. [name] [name] is a 60 year old male with a past medical history of type 2 diabetes, nephrotic syndorme, essential hypertension that presents today with bilateral lower extremity wounds. 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 He is currently taking torsemide and metolazone. During his hospital stay he had compression wraps placed to his legs bilaterally. He was discharged on 3/31 and the wraps were removed about 1 week ago. He had blister formation throughout his legs and these have almost all opened. He has skin breakdown with serous drainage scattered throughout his legs bilaterally. He currently does not do anything for dressing changes. He states that for the past week these have remained stable. He denies fever/chills or increased warmth or erythema to the lower extremities bilaterally. 4/22; patient presents for 1 week follow-up of bilateral lower extremity wounds. He had wraps placed in our clinic last week with silver alginate underneath. Today the wounds appear healing and some have closed. I would  like to have a dressing done every day however the patient nor the patient's wife can do this. Overall patient states he is doing well 4/26; patient presents for 1 week follow-up of bilateral upper lower extremity wounds. He reports no issues today. He denies any pain. He states that the wrap stayed in place over the past week. 5/3; patient presents for 1 week follow-up. He reports no issues today. He has tolerated the wraps well. Electronic Signature(s) Signed: 10/13/2020 11:36:44 AM By: Kalman Shan DO Entered By: Kalman Shan on 10/13/2020 11:31:33 -------------------------------------------------------------------------------- Physical Exam Details Patient Name: Date of Service: [name] [name], [name] [name] 10/13/2020 10:45 A M Medical Record Number: [id number] Patient Account Number: [id number] Date of Birth/Sex: Treating RN: [birth year] (60 y.o. Erie Noe Primary Care Provider: [name] Other Clinician: Referring Provider: Treating Provider/Extender: [name] in Treatment: 2 Constitutional respirations regular, non-labored and within target range for patient.. Cardiovascular 2+ dorsalis pedis/posterior tibialis pulses. Psychiatric pleasant and cooperative. Notes Lower extremities bilaterally: Scattered wounds limited to skin breakdown. There was some nonviable tissue present and this was debrided. He continues to improve. No blisters noted. No signs of infection. Electronic Signature(s) Signed: 10/13/2020 11:36:44 AM By: Kalman Shan DO Entered By: Kalman Shan on 10/13/2020 11:32:24 -------------------------------------------------------------------------------- Physician Orders Details Patient Name: Date of Service: [name] [name], [name] [name] 10/13/2020 10:45 A M Medical Record Number: [id number] Patient Account Number: [id number] Date of Birth/Sex: Treating RN: 08/17/1960 (60 y.o. 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 - Dr. Heber [place] Return A Bathing/ Shower/ Hygiene May shower with protection but do not get wound dressing(s) wet. - May use cast wraps from [facility] or [facility]. Edema Control - Lymphedema / SCD / Other Elevate legs to the level of the heart or above for 30 minutes daily and/or when sitting, a frequency of: Avoid standing for long periods of time. Non Wound Condition pply the following to affected area as directed: - xeroform to right dorsal foot with dressing changes A Home  Health New wound care orders this week; continue Home Health for wound care. May utilize formulary equivalent dressing for wound treatment orders unless otherwise specified. - [facility] home health to change twice a week and wound center on Tuesdays. change the primary dressing prisma. Wound Treatment Wound #2 - Lower Leg Wound Laterality: Right, Circumferential Cleanser: Soap and Water (Home Health) 3 x Per Week/15 Days Discharge Instructions: May shower and wash wound with dial antibacterial soap and water prior to dressing change. Cleanser: Wound Cleanser (Home Health) 3 x Per Week/15 Days Discharge Instructions: Cleanse the wound with wound cleanser prior to applying a clean dressing using gauze sponges, not tissue or cotton balls. Peri-Wound Care: Triamcinolone 15 (g) 3 x Per Week/15 Days Discharge Instructions: Only apply in clinic. Use triamcinolone  15 (g) as directed Peri-Wound Care: Sween Lotion (Moisturizing lotion) (Home Health) 3 x Per Week/15 Days Discharge Instructions: Apply moisturizing lotion as directed Prim Dressing: Promogran Prisma Matrix, 4.34 (sq in) (silver collagen) (Home Health) 3 x Per Week/15 Days ary Discharge Instructions: Moisten collagen with saline or hydrogel Secondary Dressing: Woven Gauze Sponge, Non-Sterile 4x4 in (Home Health) 3 x Per Week/15 Days Discharge Instructions: Apply over primary dressing as directed. Compression Wrap: Kerlix Roll 4.5x3.1 (in/yd) (Home Health) 3 x Per Week/15 Days Discharge Instructions: Apply Kerlix and Coban compression as directed. ***Apply lightly.*** Compression Wrap: Coban Self-Adherent Wrap 4x5 (in/yd) (Home Health) 3 x Per Week/15 Days Discharge Instructions: Apply over Kerlix as directed. Wound #4 - Lower Leg Wound Laterality: Left, Circumferential Cleanser: Soap and Water (Home Health) 3 x Per Week/15 Days Discharge Instructions: May shower and wash wound with dial antibacterial soap and water prior to dressing change. Cleanser: Wound Cleanser (Home Health) 3 x Per Week/15 Days Discharge Instructions: Cleanse the wound with wound cleanser prior to applying a clean dressing using gauze sponges, not tissue or cotton balls. Peri-Wound Care: Triamcinolone 15 (g) 3 x Per Week/15 Days Discharge Instructions: Only apply in clinic. Use triamcinolone 15 (g) as directed Peri-Wound Care: Sween Lotion (Moisturizing lotion) (Home Health) 3 x Per Week/15 Days Discharge Instructions: Apply moisturizing lotion as directed Prim Dressing: Promogran Prisma Matrix, 4.34 (sq in) (silver collagen) (Home Health) 3 x Per Week/15 Days ary Discharge Instructions: Moisten collagen with saline or hydrogel Secondary Dressing: Woven Gauze Sponge, Non-Sterile 4x4 in (Home Health) 3 x Per Week/15 Days Discharge Instructions: Apply over primary dressing as directed. Compression Wrap: Kerlix Roll  4.5x3.1 (in/yd) (Home Health) 3 x Per Week/15 Days Discharge Instructions: Apply Kerlix and Coban compression as directed. ***Apply lightly.*** Compression Wrap: Coban Self-Adherent Wrap 4x5 (in/yd) (Home Health) 3 x Per Week/15 Days Discharge Instructions: Apply over Kerlix as directed. Electronic Signature(s) Signed: 10/13/2020 11:36:44 AM By: Kalman Shan DO Entered By: Kalman Shan on 10/13/2020 11:32:56 -------------------------------------------------------------------------------- Problem List Details Patient Name: Date of Service: [name] [name], [name] [name] 10/13/2020 10:45 A M Medical Record Number: [id number] Patient Account Number: [id number] Date of Birth/Sex: Treating RN: 05/29/1961 (60 y.o. [name] Primary Care Provider: [name] Other Clinician: Referring Provider: Treating Provider/Extender: [name] in Treatment: 2 Active Problems ICD-10 Encounter Code Description Active Date MDM Diagnosis L97.819 Non-pressure chronic ulcer of other part of right lower leg with unspecified 09/25/2020 No Yes severity L97.829 Non-pressure chronic ulcer of other part of left lower leg with unspecified 09/25/2020 No Yes severity E11.65 Type 2 diabetes mellitus with hyperglycemia 09/25/2020 No Yes N04.9 Nephrotic syndrome with unspecified morphologic changes 09/25/2020 No Yes I10 Essential (primary) hypertension 09/25/2020 No  Yes Inactive Problems Resolved Problems Electronic Signature(s) Signed: 10/13/2020 11:36:44 AM By: Kalman Shan DO Entered By: Kalman Shan on 10/13/2020 11:30:21 -------------------------------------------------------------------------------- Progress Note Details Patient Name: Date of Service: [name] [name], [name] [name] 10/13/2020 10:45 A M Medical Record Number: [id number] Patient Account Number: [id number] Date of Birth/Sex: Treating RN: 10-Nov-1960 (60 y.o. [name] [name], [name] [name] Primary Care Provider: [name] Other  Clinician: Referring Provider: Treating Provider/Extender: [name] in Treatment: 2 Subjective Chief Complaint Information obtained from Patient Bilateral lower extremity wounds History of Present Illness (HPI) Mr. [name] [name] is a 60 year old male with a past medical history of type 2 diabetes, nephrotic syndorme, essential hypertension that presents today with bilateral lower extremity wounds. 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 He is currently taking torsemide and metolazone. During his hospital stay he had compression wraps placed to his legs bilaterally. He was discharged on 3/31 and the wraps were removed about 1 week ago. He had blister formation throughout his legs and these have almost all opened. He has skin breakdown with serous drainage scattered throughout his legs bilaterally. He currently does not do anything for dressing changes. He states that for the past week these have remained stable. He denies fever/chills or increased warmth or erythema to the lower extremities bilaterally. 4/22; patient presents for 1 week follow-up of bilateral lower extremity wounds. He had wraps placed in our clinic last week with silver alginate underneath. Today the wounds appear healing and some have closed. I would like to have a dressing done every day however the patient nor the patient's wife can do this. Overall patient states he is doing well 4/26; patient presents for 1 week follow-up of bilateral upper lower extremity wounds. He reports no issues today. He denies any pain. He states that the wrap stayed in place over the past week. 5/3; patient presents for 1 week follow-up. He reports no issues today. He has tolerated the wraps well. Patient History Information obtained from Patient. Family History Cancer - Father, Diabetes - Mother, Hypertension - Mother, No family  history of Heart Disease, Hereditary Spherocytosis, Kidney Disease, Lung Disease, Seizures, Stroke, Thyroid Problems, Tuberculosis. Social History Never smoker, Marital Status - Single, Alcohol Use - Never, Drug Use - No History, Caffeine Use - Never. Medical History Eyes Denies history of Cataracts, Glaucoma, Optic Neuritis Ear/Nose/Mouth/Throat Denies history of Chronic sinus problems/congestion, Middle ear problems Hematologic/Lymphatic Denies history of Anemia, Hemophilia, Human Immunodeficiency Virus, Lymphedema, Sickle Cell Disease Respiratory Denies history of Aspiration, Asthma, Chronic Obstructive Pulmonary Disease (COPD), Pneumothorax, Sleep Apnea, Tuberculosis Cardiovascular Patient has history of Congestive Heart Failure, Hypertension Denies history of Angina, Arrhythmia, Coronary Artery Disease, Deep Vein Thrombosis, Hypotension, Myocardial Infarction, Peripheral Arterial Disease, Peripheral Venous Disease, Phlebitis, Vasculitis Gastrointestinal Denies history of Cirrhosis , Colitis, Crohnoos, Hepatitis A, Hepatitis B, Hepatitis C Endocrine Patient has history of Type II Diabetes Denies history of Type I Diabetes Immunological Denies history of Lupus Erythematosus, Raynaudoos, Scleroderma Integumentary (Skin) Denies history of History of Burn Musculoskeletal Patient has history of Rheumatoid Arthritis Denies history of Gout, Osteoarthritis, Osteomyelitis Neurologic Patient has history of Neuropathy Denies history of Dementia, Quadriplegia, Paraplegia, Seizure Disorder Oncologic Denies history of Received Chemotherapy, Received Radiation Psychiatric Denies history of Anorexia/bulimia, Confinement Anxiety Hospitalization/Surgery History - cataract extraction bilateral. - tonsillectomy. Medical A Surgical History Notes nd Gastrointestinal GERD Objective Constitutional respirations regular, non-labored and within target range for patient.. Vitals Time Taken:  11:00 AM, Height: 70  in, Weight: 148 lbs, BMI: 21.2, Temperature: 97.9 F, Pulse: 51 bpm, Respiratory Rate: 18 breaths/min, Blood Pressure: 142/70 mmHg, Capillary Blood Glucose: 80 mg/dl. Cardiovascular 2+ dorsalis pedis/posterior tibialis pulses. Psychiatric pleasant and cooperative. General Notes: Lower extremities bilaterally: Scattered wounds limited to skin breakdown. There was some nonviable tissue present and this was debrided. He continues to improve. No blisters noted. No signs of infection. Integumentary (Hair, Skin) Wound #2 status is Open. Original cause of wound was Blister. The date acquired was: 09/10/2020. The wound has been in treatment 2 weeks. The wound is located on the Right,Circumferential Lower Leg. The wound measures 0.1cm length x 0.1cm width x 0.1cm depth; 0.008cm^2 area and 0.001cm^3 volume. There is Fat Layer (Subcutaneous Tissue) exposed. There is no tunneling or undermining noted. There is a none present amount of drainage noted. The wound margin is distinct with the outline attached to the wound base. There is no granulation within the wound bed. There is no necrotic tissue within the wound bed. General Notes: scabbed over. Wound #4 status is Open. Original cause of wound was Blister. The date acquired was: 09/10/2020. The wound has been in treatment 2 weeks. The wound is located on the Left,Circumferential Lower Leg. The wound measures 0.1cm length x 0.1cm width x 0.1cm depth; 0.008cm^2 area and 0.001cm^3 volume. There is Fat Layer (Subcutaneous Tissue) exposed. There is no tunneling or undermining noted. There is a none present amount of drainage noted. The wound margin is distinct with the outline attached to the wound base. There is no granulation within the wound bed. There is no necrotic tissue within the wound bed. General Notes: scabbed over. Wound #5 status is Healed - Epithelialized. Original cause of wound was Blister. The date acquired was: 09/10/2020. The  wound has been in treatment 2 weeks. The wound is located on the Left,Dorsal Foot. The wound measures 0cm length x 0cm width x 0cm depth; 0cm^2 area and 0cm^3 volume. Wound #7 status is Healed - Epithelialized. Original cause of wound was Blister. The date acquired was: 09/10/2020. The wound has been in treatment 2 weeks. The wound is located on the Left,Dorsal T [name]. The wound measures 0cm length x 0cm width x 0cm depth; 0cm^2 area and 0cm^3 volume. 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 He no longer has drainage as the silver alginate was more difficult to remove today. I think he would benefit from collagen now. He reports he cannot do daily dressing changes. Home health has reached out and is now available to help with the patient. I would like for them to change this twice weekly. Procedures Wound #2 Pre-procedure diagnosis of Wound #2 is a Diabetic Wound/Ulcer of the Lower Extremity located on the Right,Circumferential Lower Leg .Severity of Tissue Pre Debridement is: Limited to breakdown of skin. There was a Selective/Open Wound Non-Viable Tissue Debridement with a total area of 3 sq cm performed by Kalman Shan, DO. With the following instrument(s): Curette to remove Non-Viable tissue/material. Material removed includes Eschar after achieving pain control using Lidocaine 4% T opical Solution. A time out was conducted at 11:15, prior to the start of the procedure. A Moderate amount of bleeding was controlled with Pressure. The procedure was tolerated well with a pain level of  0 throughout and a pain level of 3 following the procedure. Post  Debridement Measurements: 3cm length x 0.5cm width x 0.1cm depth; 0.118cm^3 volume. Character of Wound/Ulcer Post Debridement is improved. Severity of Tissue Post Debridement is: Fat layer exposed. Post procedure Diagnosis Wound #2: Same as Pre-Procedure Wound #4 Pre-procedure diagnosis of Wound #4 is a Diabetic Wound/Ulcer of the Lower Extremity located on the Left,Circumferential Lower Leg .Severity of Tissue Pre Debridement is: Limited to breakdown of skin. There was a Selective/Open Wound Skin/Epidermis Debridement with a total area of 6 sq cm performed by Kalman Shan, DO. With the following instrument(s): Curette to remove Non-Viable tissue/material. Material removed includes Eschar and Skin: Epidermis and after achieving pain control using Lidocaine 4% Topical Solution. A time out was conducted at 11:15, prior to the start of the procedure. A Moderate amount of bleeding was controlled with Pressure. The procedure was tolerated well with a pain level of 0 throughout and a pain level of 3 following the procedure. Post Debridement Measurements: 3cm length x 2cm width x 0.1cm depth; 0.471cm^3 volume. Character of Wound/Ulcer Post Debridement is improved. Severity of Tissue Post Debridement is: Limited to breakdown of skin. Post procedure Diagnosis Wound #4: Same as Pre-Procedure Plan Follow-up Appointments: Return Appointment in 1 week. - Dr. Heber [place] Bathing/ Shower/ Hygiene: May shower with protection but do not get wound dressing(s) wet. - May use cast wraps from [facility] or [facility]. Edema Control - Lymphedema / SCD / Other: Elevate legs to the level of the heart or above for 30 minutes daily and/or when sitting, a frequency of: Avoid standing for long periods of time. Non Wound Condition: Apply the following to affected area as directed: - xeroform to right dorsal foot with dressing changes Home Health: New wound care orders this week; continue Home Health for wound care. May utilize  formulary equivalent dressing for wound treatment orders unless otherwise specified. - [facility] home health to change twice a week and wound center on Tuesdays. change the primary dressing prisma. WOUND #2: - Lower Leg Wound Laterality: Right, Circumferential Cleanser: Soap and Water (Home Health) 3 x Per Week/15 Days Discharge Instructions: May shower and wash wound with dial antibacterial soap and water prior to dressing change. Cleanser: Wound Cleanser (Home Health) 3 x Per Week/15 Days Discharge Instructions: Cleanse the wound with wound cleanser prior to applying a clean dressing using gauze sponges, not tissue or cotton balls. Peri-Wound Care: Triamcinolone 15 (g) 3 x Per Week/15 Days Discharge Instructions: Only apply in clinic. Use triamcinolone 15 (g) as directed Peri-Wound Care: Sween Lotion (Moisturizing lotion) (Home Health) 3 x Per Week/15 Days Discharge Instructions: Apply moisturizing lotion as directed Prim Dressing: Promogran Prisma Matrix, 4.34 (sq in) (silver collagen) (Home Health) 3 x Per Week/15 Days ary Discharge Instructions: Moisten collagen with saline or hydrogel Secondary Dressing: Woven Gauze Sponge, Non-Sterile 4x4 in (Home Health) 3 x Per Week/15 Days Discharge Instructions: Apply over primary dressing as directed. Com pression Wrap: Kerlix Roll 4.5x3.1 (in/yd) (Home Health) 3 x Per Week/15 Days Discharge Instructions: Apply Kerlix and Coban compression as directed. ***Apply lightly.*** Com pression Wrap: Coban Self-Adherent Wrap 4x5 (in/yd) (Home Health) 3 x Per Week/15 Days Discharge Instructions: Apply over Kerlix as directed. WOUND #4: - Lower Leg Wound Laterality: Left, Circumferential Cleanser: Soap and Water (Home Health) 3 x Per Week/15 Days Discharge Instructions: May shower and wash wound with dial antibacterial soap and water prior to dressing change. Cleanser: Wound Cleanser (Home Health) 3 x Per Week/15 Days  Discharge Instructions: Cleanse the  wound with wound cleanser prior to applying a clean dressing using gauze sponges, not tissue or cotton balls. Peri-Wound Care: Triamcinolone 15 (g) 3 x Per Week/15 Days Discharge Instructions: Only apply in clinic. Use triamcinolone 15 (g) as directed Peri-Wound Care: Sween Lotion (Moisturizing lotion) (Home Health) 3 x Per Week/15 Days Discharge Instructions: Apply moisturizing lotion as directed Prim Dressing: Promogran Prisma Matrix, 4.34 (sq in) (silver collagen) (Home Health) 3 x Per Week/15 Days ary Discharge Instructions: Moisten collagen with saline or hydrogel Secondary Dressing: Woven Gauze Sponge, Non-Sterile 4x4 in (Home Health) 3 x Per Week/15 Days Discharge Instructions: Apply over primary dressing as directed. Com pression Wrap: Kerlix Roll 4.5x3.1 (in/yd) (Home Health) 3 x Per Week/15 Days Discharge Instructions: Apply Kerlix and Coban compression as directed. ***Apply lightly.*** Com pression Wrap: Coban Self-Adherent Wrap 4x5 (in/yd) (Home Health) 3 x Per Week/15 Days Discharge Instructions: Apply over Kerlix as directed. 1. Silver collagen under Kerlix/Coban 2. Home health to change twice weekly 3. Follow-up in 1 week Electronic Signature(s) Signed: 10/13/2020 11:36:44 AM By: Kalman Shan DO Entered By: Kalman Shan on 10/13/2020 11:34:35 -------------------------------------------------------------------------------- HxROS Details Patient Name: Date of Service: [name] [name], [place] 10/13/2020 10:45 A M Medical Record Number: [id number] Patient Account Number: [id number] Date of Birth/Sex: Treating RN: 1960/07/27 (60 y.o. Erie Noe Primary Care Provider: [name] Other Clinician: Referring Provider: Treating Provider/Extender: [name] in Treatment: 2 Information Obtained From Patient Eyes Medical History: Negative for: Cataracts; Glaucoma; Optic Neuritis Ear/Nose/Mouth/Throat Medical History: Negative for: Chronic  sinus problems/congestion; Middle ear problems Hematologic/Lymphatic Medical History: Negative for: Anemia; Hemophilia; Human Immunodeficiency Virus; Lymphedema; Sickle Cell Disease Respiratory Medical History: Negative for: Aspiration; Asthma; Chronic Obstructive Pulmonary Disease (COPD); Pneumothorax; Sleep Apnea; Tuberculosis Cardiovascular Medical History: Positive for: Congestive Heart Failure; Hypertension Negative for: Angina; Arrhythmia; Coronary Artery Disease; Deep Vein Thrombosis; Hypotension; Myocardial Infarction; Peripheral Arterial Disease; Peripheral Venous Disease; Phlebitis; Vasculitis Gastrointestinal Medical History: Negative for: Cirrhosis ; Colitis; Crohns; Hepatitis A; Hepatitis B; Hepatitis C Past Medical History Notes: GERD Endocrine Medical History: Positive for: Type II Diabetes Negative for: Type I Diabetes Time with diabetes: 2 years Treated with: Oral agents Blood sugar tested every day: Yes Tested : Immunological Medical History: Negative for: Lupus Erythematosus; Raynauds; Scleroderma Integumentary (Skin) Medical History: Negative for: History of Burn Musculoskeletal Medical History: Positive for: Rheumatoid Arthritis Negative for: Gout; Osteoarthritis; Osteomyelitis Neurologic Medical History: Positive for: Neuropathy Negative for: Dementia; Quadriplegia; Paraplegia; Seizure Disorder Oncologic Medical History: Negative for: Received Chemotherapy; Received Radiation Psychiatric Medical History: Negative for: Anorexia/bulimia; Confinement Anxiety Immunizations Pneumococcal Vaccine: Received Pneumococcal Vaccination: No Implantable Devices None Hospitalization / Surgery History Type of Hospitalization/Surgery cataract extraction bilateral tonsillectomy Family and Social History Cancer: Yes - Father; Diabetes: Yes - Mother; Heart Disease: No; Hereditary Spherocytosis: No; Hypertension: Yes - Mother; Kidney Disease: No; Lung Disease:  No; Seizures: No; Stroke: No; Thyroid Problems: No; Tuberculosis: No; Never smoker; Marital Status - Single; Alcohol Use: Never; Drug Use: No History; Caffeine Use: Never; Financial Concerns: No; Food, Clothing or Shelter Needs: No; Support System Lacking: No; Transportation Concerns: No Electronic Signature(s) Signed: 10/13/2020 11:36:44 AM By: Kalman Shan DO Signed: 10/16/2020 3:14:58 PM By: Rhae Hammock RN Entered By: Kalman Shan on 10/13/2020 11:31:41 -------------------------------------------------------------------------------- SuperBill Details Patient Name: Date of Service: [name] [name], [place] 10/13/2020 Medical Record Number: [id number] Patient Account Number: [id number] Date of Birth/Sex: Treating RN: 02/17/1961 (60 y.o. 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 E11.65 Type 2 diabetes mellitus with hyperglycemia N04.9 Nephrotic syndrome with unspecified morphologic changes I10 Essential (primary) hypertension Facility Procedures Physician Procedures : CPT4 Code Description Modifier [id number] - WC PHYS DEBR WO ANESTH 20 SQ CM ICD-10 Diagnosis Description L97.819 Non-pressure chronic ulcer of other part of right lower leg with unspecified severity L97.829 Non-pressure chronic ulcer of other part  of left lower leg with unspecified severity Quantity: 1 Electronic Signature(s) Signed: 10/14/2020 6:54:10 PM By: Deon Pilling Signed: 10/20/2020 3:23:56 PM By: Kalman Shan DO Previous Signature: 10/13/2020 11:36:44 AM Version By: Kalman Shan DO Entered By: Deon Pilling on 10/13/2020 12:45:06

## 2020-10-21 ENCOUNTER — Ambulatory Visit: Payer: Medicare HMO | Admitting: General Practice

## 2020-10-21 NOTE — Progress Notes (Addendum)
[name], [name] ([id number]) Visit Report for 10/20/2020 Arrival Information Details Patient Name: Date of Service: [name], [name] 10/20/2020 11:00 A M Medical Record Number: [id number] Patient Account Number: [id number] Date of Birth/Sex: Treating RN: Oct 31, 1960 (60 y.o. [name] [name] Primary Care Farhaan Mabee: [name] [name] Other Clinician: Referring Florence Yeung: Treating [name] [name]/Extender: [name] [name] in Treatment: 3 Visit Information History Since Last Visit Added or deleted any medications: No Patient Arrived: Wheel Chair Any new allergies or adverse reactions: No Arrival Time: 11:29 Had a fall or experienced change in No Accompanied By: wife activities of daily living that may affect Transfer Assistance: None risk of falls: Patient Identification Verified: Yes Signs or symptoms of abuse/neglect since last visito No Secondary Verification Process Completed: Yes Hospitalized since last visit: No Patient Requires Transmission-Based Precautions: No Implantable device outside of the clinic excluding No Patient Has Alerts: No cellular tissue based products placed in the center since last visit: Has Dressing in Place as Prescribed: Yes Pain Present Now: No Electronic Signature(s) Signed: 10/21/2020 9:46:23 AM By: [name] [name] Entered By: [name] [name] on 10/20/2020 11:29:23 -------------------------------------------------------------------------------- Clinic Level of Care Assessment Details Patient Name: Date of Service: PLEAS, NEWLON 10/20/2020 11:00 A M Medical Record Number: [id number] Patient Account Number: [id number] Date of Birth/Sex: Treating RN: [birth year] (60 y.o. [name] [name], [name] [name] Primary Care Janazia Schreier: [name] [name] Other Clinician: Referring Jakobie Henslee: Treating Rain Wilhide/Extender: [name] [name] in Treatment: 3 Clinic Level of Care Assessment Items TOOL 4 Quantity Score X- 1 0 Use when only an EandM is performed  on FOLLOW-UP visit ASSESSMENTS - Nursing Assessment / Reassessment X- 1 10 Reassessment of Co-morbidities (includes updates in patient status) X- 1 5 Reassessment of Adherence to Treatment Plan ASSESSMENTS - Wound and Skin A ssessment / Reassessment '[]'$  - 0 Simple Wound Assessment / Reassessment - one wound X- 2 5 Complex Wound Assessment / Reassessment - multiple wounds X- 1 10 Dermatologic / Skin Assessment (not related to wound area) ASSESSMENTS - Focused Assessment X- 2 5 Circumferential Edema Measurements - multi extremities X- 1 10 Nutritional Assessment / Counseling / Intervention '[]'$  - 0 Lower Extremity Assessment (monofilament, tuning fork, pulses) '[]'$  - 0 Peripheral Arterial Disease Assessment (using hand held doppler) ASSESSMENTS - Ostomy and/or Continence Assessment and Care '[]'$  - 0 Incontinence Assessment and Management '[]'$  - 0 Ostomy Care Assessment and Management (repouching, etc.) PROCESS - Coordination of Care '[]'$  - 0 Simple Patient / Family Education for ongoing care X- 1 20 Complex (extensive) Patient / Family Education for ongoing care X- 1 10 Staff obtains Consents, Records, T Results / Process Orders est X- 1 10 Staff telephones HHA, Nursing Homes / Clarify orders / etc '[]'$  - 0 Routine Transfer to another Facility (non-emergent condition) '[]'$  - 0 Routine Hospital Admission (non-emergent condition) '[]'$  - 0 New Admissions / Biomedical engineer / Ordering NPWT Apligraf, etc. , '[]'$  - 0 Emergency Hospital Admission (emergent condition) '[]'$  - 0 Simple Discharge Coordination X- 1 15 Complex (extensive) Discharge Coordination PROCESS - Special Needs '[]'$  - 0 Pediatric / Minor Patient Management '[]'$  - 0 Isolation Patient Management '[]'$  - 0 Hearing / Language / Visual special needs '[]'$  - 0 Assessment of Community assistance (transportation, D/C planning, etc.) '[]'$  - 0 Additional assistance / Altered mentation '[]'$  - 0 Support Surface(s) Assessment (bed,  cushion, seat, etc.) INTERVENTIONS - Wound Cleansing / Measurement '[]'$  - 0 Simple Wound Cleansing - one wound X- 2 5 Complex Wound Cleansing - multiple wounds X- 1 5 Wound Imaging (  photographs - any number of wounds) '[]'$  - 0 Wound Tracing (instead of photographs) '[]'$  - 0 Simple Wound Measurement - one wound X- 2 5 Complex Wound Measurement - multiple wounds INTERVENTIONS - Wound Dressings X - Small Wound Dressing one or multiple wounds 1 10 '[]'$  - 0 Medium Wound Dressing one or multiple wounds '[]'$  - 0 Large Wound Dressing one or multiple wounds '[]'$  - 0 Application of Medications - topical '[]'$  - 0 Application of Medications - injection INTERVENTIONS - Miscellaneous '[]'$  - 0 External ear exam '[]'$  - 0 Specimen Collection (cultures, biopsies, blood, body fluids, etc.) '[]'$  - 0 Specimen(s) / Culture(s) sent or taken to Lab for analysis '[]'$  - 0 Patient Transfer (multiple staff / Civil Service fast streamer / Similar devices) '[]'$  - 0 Simple Staple / Suture removal (25 or less) '[]'$  - 0 Complex Staple / Suture removal (26 or more) '[]'$  - 0 Hypo / Hyperglycemic Management (close monitor of Blood Glucose) '[]'$  - 0 Ankle / Brachial Index (ABI) - do not check if billed separately X- 1 5 Vital Signs Has the patient been seen at the hospital within the last three years: Yes Total Score: 150 Level Of Care: New/Established - Level 4 Electronic Signature(s) Signed: 10/20/2020 6:10:24 PM By: [name] [name] Entered By: [name] [name] on 10/20/2020 18:03:47 -------------------------------------------------------------------------------- Encounter Discharge Information Details Patient Name: Date of Service: [name] [name], [name] [name] 10/20/2020 11:00 A M Medical Record Number: [id number] Patient Account Number: [id number] Date of Birth/Sex: Treating RN: [birth year] (60 y.o. [name] [name] Primary Care Eloy Fehl: [name] [name] Other Clinician: Referring Jazira Maloney: Treating Danarius Mcconathy/Extender: [name] [name] in  Treatment: 3 Encounter Discharge Information Items Discharge Condition: Stable Ambulatory Status: Wheelchair Discharge Destination: Home Transportation: Private Auto Accompanied By: wife Schedule Follow-up Appointment: Yes Clinical Summary of Care: Provided on 10/20/2020 Form Type Recipient Paper Patient Patient Electronic Signature(s) Signed: 10/20/2020 12:06:39 PM By: [name] [name] Entered By: [name] [name] on 10/20/2020 12:06:38 -------------------------------------------------------------------------------- Lower Extremity Assessment Details Patient Name: Date of Service: [name] [name], [name] [name] 10/20/2020 11:00 A M Medical Record Number: [id number] Patient Account Number: [id number] Date of Birth/Sex: Treating RN: 09-Sep-1960 (60 y.o. [name] [name] Primary Care Breya Cass: [name] [name] Other Clinician: Referring Lanai Conlee: Treating Rosela Supak/Extender: [name] [name] in Treatment: 3 Edema Assessment Assessed: Shirlyn Goltz: No] Patrice Paradise: No] Edema: [Left: No] [Right: No] Calf Left: Right: Point of Measurement: 30 cm From Medial Instep 29.5 cm 30 cm Ankle Left: Right: Point of Measurement: 9 cm From Medial Instep 21.5 cm 21.5 cm Electronic Signature(s) Signed: 10/20/2020 6:10:24 PM By: [name] [name] Entered By: [name] [name] on 10/20/2020 11:44:09 -------------------------------------------------------------------------------- Multi Wound Chart Details Patient Name: Date of Service: [name] [name], [name] [name] 10/20/2020 11:00 A M Medical Record Number: [id number] Patient Account Number: [id number] Date of Birth/Sex: Treating RN: 1960/09/25 (60 y.o. [name] [name] Primary Care Schylar Allard: [name] [name] Other Clinician: Referring Gerrod Maule: Treating Nevaeh Korte/Extender: [name] [name] in Treatment: 3 Vital Signs Height(in): 70 Capillary Blood Glucose(mg/dl): 61 Weight(lbs): 148 Pulse(bpm): 34 Body Mass Index(BMI): 21 Blood Pressure(mmHg):  145/66 Temperature(F): 97.6 Respiratory Rate(breaths/min): 18 Photos: [2:No Photos Right, Circumferential Lower Leg] [4:No Photos Left, Circumferential Lower Leg] [N/A:N/A N/A] Wound Location: [2:Blister] [4:Blister] [N/A:N/A] Wounding Event: [2:Diabetic Wound/Ulcer of the Lower] [4:Diabetic Wound/Ulcer of the Lower] [N/A:N/A] Primary Etiology: [2:Extremity Congestive Heart Failure,] [4:Extremity N/A] [N/A:N/A] Comorbid History: [2:Hypertension, Type II Diabetes, Rheumatoid Arthritis, Neuropathy 09/10/2020] [4:09/10/2020] [N/A:N/A] Date Acquired: [2:3] [4:3] [N/A:N/A] Weeks of Treatment: [2:Open] [4:Open] [N/A:N/A] Wound Status: [2:Yes] [4:No] [N/A:N/A] Clustered Wound: [2:0.1x0.1x0.1] [4:0x0x0] [N/A:N/A] Measurements  L x W x D (cm) [2:0.008] [4:0] [N/A:N/A] A (cm) : rea [2:0.001] [4:0] [N/A:N/A] Volume (cm) : [2:100.00%] [4:100.00%] [N/A:N/A] % Reduction in A rea: [2:100.00%] [4:100.00%] [N/A:N/A] % Reduction in Volume: [2:Grade 2] [4:Grade 2] [N/A:N/A] Classification: [2:Small] [4:N/A] [N/A:N/A] Exudate A mount: [2:Serosanguineous] [4:N/A] [N/A:N/A] Exudate Type: [2:red, Whipp] [4:N/A] [N/A:N/A] Exudate Color: [2:Distinct, outline attached] [4:N/A] [N/A:N/A] Wound Margin: [2:Large (67-100%)] [4:N/A] [N/A:N/A] Granulation A mount: [2:Red] [4:N/A] [N/A:N/A] Granulation Quality: [2:None Present (0%)] [4:N/A] [N/A:N/A] Necrotic A mount: [2:Fat Layer (Subcutaneous Tissue): Yes N/A] [N/A:N/A] Exposed Structures: [2:Fascia: No Tendon: No Muscle: No Joint: No Bone: No Large (67-100%)] [4:N/A] [N/A:N/A] Treatment Notes Electronic Signature(s) Signed: 10/20/2020 11:56:49 AM By: [name] Shan DO Signed: 10/20/2020 6:10:24 PM By: [name] [name] Entered By: [name] [name] on 10/20/2020 11:51:36 -------------------------------------------------------------------------------- Multi-Disciplinary Care Plan Details Patient Name: Date of Service: [name] [name], [place] 10/20/2020 11:00 A M Medical  Record Number: [id number] Patient Account Number: [id number] Date of Birth/Sex: Treating RN: 25-Dec-1960 (60 y.o. [name] [name] Primary Care Kayleigh Broadwell: [name] [name] Other Clinician: Referring Harmoney Sienkiewicz: Treating Eldon Zietlow/Extender: [name] [name] in Treatment: 3 Active Inactive Electronic Signature(s) Signed: 12/28/2020 9:22:46 PM By: [name] [name] Signed: 01/01/2021 2:03:23 PM By: [name] Gouty RN, BSN Previous Signature: 10/20/2020 6:10:24 PM Version By: [name] [name] Entered By: [name] [name] on 12/03/2020 14:01:40 -------------------------------------------------------------------------------- Pain Assessment Details Patient Name: Date of Service: [name] [name], [name] [name] 10/20/2020 11:00 A M Medical Record Number: [id number] Patient Account Number: [id number] Date of Birth/Sex: Treating RN: 01/12/1961 (61 y.o. [name] [name] Primary Care Deangelo Berns: [name] [name] Other Clinician: Referring Franciso Dierks: Treating Takyah Ciaramitaro/Extender: [name] [name] in Treatment: 3 Active Problems Location of Pain Severity and Description of Pain Patient Has Paino No Site Locations Pain Management and Medication Current Pain Management: Electronic Signature(s) Signed: 10/20/2020 6:10:24 PM By: [name] [name] Signed: 10/21/2020 9:46:23 AM By: [name] [name] Entered By: [name] [name] on 10/20/2020 11:29:43 -------------------------------------------------------------------------------- Patient/Caregiver Education Details Patient Name: Date of Service: [name] [name], [name] [name] 5/10/2022andnbsp11:00 A M Medical Record Number: [id number] Patient Account Number: [id number] Date of Birth/Gender: Treating RN: Jun 03, 1961 (60 y.o. [name] [name] Primary Care Physician: [name] [name] Other Clinician: Referring Physician: Treating Physician/Extender: [name] [name] in Treatment: 3 Education Assessment Education Provided To: Patient Education  Topics Provided Wound/Skin Impairment: Handouts: Skin Care Do's and Dont's Methods: Explain/Verbal Responses: Reinforcements needed Electronic Signature(s) Signed: 10/20/2020 6:10:24 PM By: [name] [name] Entered By: [name] [name] on 10/20/2020 11:45:33 -------------------------------------------------------------------------------- Wound Assessment Details Patient Name: Date of Service: KARRSON, KAWCZYNSKI 10/20/2020 11:00 A M Medical Record Number: [id number] Patient Account Number: [id number] Date of Birth/Sex: Treating RN: 1961/04/18 (59 y.o. [name] [name], [name] [name] Primary Care Chalyn Amescua: [name] [name] Other Clinician: Referring Asyah Candler: Treating Deundre Thong/Extender: [name] [name] in Treatment: 3 Wound Status Wound Number: 2 Primary Diabetic Wound/Ulcer of the Lower Extremity Etiology: Wound Location: Right, Circumferential Lower Leg Wound Open Wounding Event: Blister Status: Date Acquired: 09/10/2020 Comorbid Congestive Heart Failure, Hypertension, Type II Diabetes, Weeks Of Treatment: 3 History: Rheumatoid Arthritis, Neuropathy Clustered Wound: Yes Photos Wound Measurements Length: (cm) 0.1 Width: (cm) 0.1 Depth: (cm) 0.1 Area: (cm) 0.008 Volume: (cm) 0.001 % Reduction in Area: 100% % Reduction in Volume: 100% Epithelialization: Large (67-100%) Tunneling: No Undermining: No Wound Description Classification: Grade 2 Wound Margin: Distinct, outline attached Exudate Amount: Small Exudate Type: Serosanguineous Exudate Color: red, Dayrit Foul Odor After Cleansing: No Slough/Fibrino No Wound Bed Granulation Amount: Large (67-100%) Exposed Structure Granulation Quality: Red Fascia Exposed: No Necrotic Amount: None Present (0%) Fat  Layer (Subcutaneous Tissue) Exposed: Yes Tendon Exposed: No Muscle Exposed: No Joint Exposed: No Bone Exposed: No Electronic Signature(s) Signed: 10/20/2020 6:10:24 PM By: [name] [name] Signed: 10/21/2020 9:46:23 AM By:  [name] [name] Entered By: [name] [name] on 10/20/2020 16:33:04 -------------------------------------------------------------------------------- Wound Assessment Details Patient Name: Date of Service: [name] [name], [place] 10/20/2020 11:00 A M Medical Record Number: [id number] Patient Account Number: [id number] Date of Birth/Sex: Treating RN: 18-Sep-1960 (60 y.o. [name] [name], [name] [name] Primary Care Eamonn Sermeno: [name] [name] Other Clinician: Referring Leeman Johnsey: Treating Margalit Leece/Extender: [name] [name] in Treatment: 3 Wound Status Wound Number: 4 Primary Etiology: Diabetic Wound/Ulcer of the Lower Extremity Wound Location: Left, Circumferential Lower Leg Wound Status: Open Wounding Event: Blister Date Acquired: 09/10/2020 Weeks Of Treatment: 3 Clustered Wound: No Wound Measurements Length: (cm) Width: (cm) Depth: (cm) Area: (cm) Volume: (cm) 0 % Reduction in Area: 100% 0 % Reduction in Volume: 100% 0 0 0 Wound Description Classification: Grade 2 Electronic Signature(s) Signed: 10/20/2020 6:10:24 PM By: [name] [name] Signed: 10/21/2020 9:46:23 AM By: [name] [name] Entered By: [name] [name] on 10/20/2020 11:35:50 -------------------------------------------------------------------------------- Vitals Details Patient Name: Date of Service: BRO WN, [place] 10/20/2020 11:00 A M Medical Record Number: [id number] Patient Account Number: [id number] Date of Birth/Sex: Treating RN: 1960-07-16 (60 y.o. [name] [name], Tammi Klippel Primary Care Demarkus Remmel: [name] [name] Other Clinician: Referring Arrian Manson: Treating Leevi Cullars/Extender: [name] [name] in Treatment: 3 Vital Signs Time Taken: 11:29 Temperature (F): 97.6 Height (in): 70 Pulse (bpm): 55 Weight (lbs): 148 Respiratory Rate (breaths/min): 18 Body Mass Index (BMI): 21.2 Blood Pressure (mmHg): 145/66 Capillary Blood Glucose (mg/dl): 81 Reference Range: 80 - 120 mg / dl Electronic  Signature(s) Signed: 10/21/2020 9:46:23 AM By: [name] [name] Entered By: [name] [name] on 10/20/2020 11:29:39

## 2020-10-22 ENCOUNTER — Encounter (HOSPITAL_BASED_OUTPATIENT_CLINIC_OR_DEPARTMENT_OTHER): Payer: Medicare HMO | Admitting: Internal Medicine

## 2020-10-23 NOTE — Progress Notes (Signed)
MELTON, FINERAN ([id number]) Visit Report for 10/20/2020 Chief Complaint Document Details Patient Name: Date of Service: TRAMPIS, MELHORN 10/20/2020 11:00 A M Medical Record Number: [id number] Patient Account Number: [id number] Date of Birth/Sex: Treating RN: 03-17-1961 (60 y.o. [name] Primary Care Provider: [name] Other Clinician: Referring Provider: Treating Provider/Extender: [name] in Treatment: 3 Information Obtained from: Patient Chief Complaint Bilateral lower extremity wounds Electronic Signature(s) Signed: 10/20/2020 11:56:49 AM By: Kalman Shan DO Entered By: Kalman Shan on 10/20/2020 11:51:45 -------------------------------------------------------------------------------- HPI Details Patient Name: Date of Service: BRO [place], [place] 10/20/2020 11:00 A M Medical Record Number: [id number] Patient Account Number: [id number] Date of Birth/Sex: Treating RN: Aug 14, 1960 (60 y.o. [name] Primary Care Provider: [name] Other Clinician: Referring Provider: Treating Provider/Extender: [name] in Treatment: 3 History of Present Illness HPI Description: Mr. Kahlif Berkman is a 60 year old male with a past medical history of type 2 diabetes, nephrotic syndorme, essential hypertension that presents today with bilateral lower extremity wounds. 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 He is currently taking torsemide and metolazone. During his hospital stay he had compression wraps placed to his legs bilaterally. He was discharged on 3/31 and the wraps were removed about 1 week ago. He had blister formation throughout his legs and these have almost all opened. He has skin breakdown with serous drainage scattered throughout his legs bilaterally. He currently does not do anything for dressing changes. He states that for the  past week these have remained stable. He denies fever/chills or increased warmth or erythema to the lower extremities bilaterally. 4/22; patient presents for 1 week follow-up of bilateral lower extremity wounds. He had wraps placed in our clinic last week with silver alginate underneath. Today the wounds appear healing and some have closed. I would like to have a dressing done every day however the patient nor the patient's wife can do this. Overall patient states he is doing well 4/26; patient presents for 1 week follow-up of bilateral upper lower extremity wounds. He reports no issues today. He denies any pain. He states that the wrap stayed in place over the past week. 5/3; patient presents for 1 week follow-up. He reports no issues today. He has tolerated the wraps well. 5/10; patient presents for 1 week follow-up. He reports no issues today. He decided to decline home health for the last week. Electronic Signature(s) Signed: 10/20/2020 11:56:49 AM By: Kalman Shan DO Entered By: Kalman Shan on 10/20/2020 11:52:39 -------------------------------------------------------------------------------- Physical Exam Details Patient Name: Date of Service: [name], [place] 10/20/2020 11:00 A M Medical Record Number: [id number] Patient Account Number: [id number] Date of Birth/Sex: Treating RN: 09-03-1960 (60 y.o. [name] Primary Care Provider: [name] Other Clinician: Referring Provider: Treating Provider/Extender: [name] in Treatment: 3 Constitutional respirations regular, non-labored and within target range for patient.. Cardiovascular no clubbing, cyanosis, significant edema, <3 sec cap refill. Psychiatric pleasant and cooperative. Notes Left lower extremity with no open wounds and epithelialization to previous wound sites. Right lower extremity with 1 small open wound with excellent granulation tissue present. No signs of infection to lower  extremities bilaterally. No blisters noted bilaterally. Electronic Signature(s) Signed: 10/20/2020 11:56:49 AM By: Kalman Shan DO Entered By: Kalman Shan on 10/20/2020 11:53:34 -------------------------------------------------------------------------------- Physician Orders Details Patient Name: Date of Service: BRO [place], [place] 10/20/2020 11:00 A M Medical Record Number: [id number] Patient Account Number: [id number] Date of Birth/Sex:  Treating RN: 1960-11-09 (61 y.o. Lorette Ang, Meta.Reding Primary Care Provider: [name] Other Clinician: Referring Provider: Treating Provider/Extender: [name] in Treatment: 3 Verbal / Phone Orders: No Diagnosis Coding ICD-10 Coding Code Description L97.819 Non-pressure chronic ulcer of other part of right lower leg with unspecified severity L97.829 Non-pressure chronic ulcer of other part of left lower leg with unspecified severity E11.65 Type 2 diabetes mellitus with hyperglycemia N04.9 Nephrotic syndrome with unspecified morphologic changes I10 Essential (primary) hypertension Follow-up Appointments ppointment in 2 weeks. - Dr. Heber [place]. if wound is closed before coming in please call wound center. Return A Bathing/ Shower/ Hygiene May shower and wash wound with soap and water. - with dressing changes. Edema Control - Lymphedema / SCD / Other Elevate legs to the level of the heart or above for 30 minutes daily and/or when sitting, a frequency of: Avoid standing for long periods of time. Exercise regularly Moisturize legs daily. - every night before bed. Additional Orders / Instructions Other: - ensure to wear socks daily to protect skin. [employer] home health for wound care. - [facility]. Wound Treatment Wound #2 - Lower Leg Wound Laterality: Right, Circumferential Cleanser: Soap and Water [facility]) Every Other Day/15 Days Discharge Instructions: May shower and wash wound with  dial antibacterial soap and water prior to dressing change. Prim Dressing: Promogran Prisma Matrix, 4.34 (sq in) (silver collagen) (Home Health) Every Other Day/15 Days ary Discharge Instructions: Moisten collagen with saline or hydrogel Secondary Dressing: Zetuvit Plus Silicone Border Dressing 4x4 (in/in) Every Other Day/15 Days Discharge Instructions: ***Apply use bandaid.*** Apply silicone border over primary dressing as directed. Electronic Signature(s) Signed: 10/20/2020 11:56:49 AM By: Kalman Shan DO Entered By: Kalman Shan on 10/20/2020 11:53:49 -------------------------------------------------------------------------------- Problem List Details Patient Name: Date of Service: [name], [place] 10/20/2020 11:00 A M Medical Record Number: [id number] Patient Account Number: [id number] Date of Birth/Sex: Treating RN: 18-Jan-1961 (60 y.o. [name] Primary Care Provider: [name] Other Clinician: Referring Provider: Treating Provider/Extender: [name] in Treatment: 3 Active Problems ICD-10 Encounter Code Description Active Date MDM Diagnosis L97.819 Non-pressure chronic ulcer of other part of right lower leg with unspecified 09/25/2020 No Yes severity L97.829 Non-pressure chronic ulcer of other part of left lower leg with unspecified 09/25/2020 No Yes severity E11.65 Type 2 diabetes mellitus with hyperglycemia 09/25/2020 No Yes N04.9 Nephrotic syndrome with unspecified morphologic changes 09/25/2020 No Yes I10 Essential (primary) hypertension 09/25/2020 No Yes Inactive Problems Resolved Problems Electronic Signature(s) Signed: 10/20/2020 11:56:49 AM By: Kalman Shan DO Entered By: Kalman Shan on 10/20/2020 11:51:29 -------------------------------------------------------------------------------- Progress Note Details Patient Name: Date of Service: [id number] [place], [place] 10/20/2020 11:00 A M Medical Record Number: [id number] Patient  Account Number: [id number] Date of Birth/Sex: Treating RN: 1960-09-01 (59 y.o. [name] Primary Care Provider: [name] Other Clinician: Referring Provider: Treating Provider/Extender: [name] in Treatment: 3 Subjective Chief Complaint Information obtained from Patient Bilateral lower extremity wounds History of Present Illness (HPI) Mr. [place]on Bayles is a 60 year old male with a past medical history of type 2 diabetes, nephrotic syndorme, essential hypertension that presents today with bilateral lower extremity wounds. 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 He is currently taking torsemide and metolazone. During his hospital stay he had compression wraps placed to his legs bilaterally. He was discharged on 3/31 and the wraps were removed about 1 week ago. He had  blister formation throughout his legs and these have almost all opened. He has skin breakdown with serous drainage scattered throughout his legs bilaterally. He currently does not do anything for dressing changes. He states that for the past week these have remained stable. He denies fever/chills or increased warmth or erythema to the lower extremities bilaterally. 4/22; patient presents for 1 week follow-up of bilateral lower extremity wounds. He had wraps placed in our clinic last week with silver alginate underneath. Today the wounds appear healing and some have closed. I would like to have a dressing done every day however the patient nor the patient's wife can do this. Overall patient states he is doing well 4/26; patient presents for 1 week follow-up of bilateral upper lower extremity wounds. He reports no issues today. He denies any pain. He states that the wrap stayed in place over the past week. 5/3; patient presents for 1 week follow-up. He reports no issues today. He has tolerated the wraps well. 5/10;  patient presents for 1 week follow-up. He reports no issues today. He decided to decline home health for the last week. Patient History Information obtained from Patient. Family History Cancer - Father, Diabetes - Mother, Hypertension - Mother, No family history of Heart Disease, Hereditary Spherocytosis, Kidney Disease, Lung Disease, Seizures, Stroke, Thyroid Problems, Tuberculosis. Social History Never smoker, Marital Status - Single, Alcohol Use - Never, Drug Use - No History, Caffeine Use - Never. Medical History Eyes Denies history of Cataracts, Glaucoma, Optic Neuritis Ear/Nose/Mouth/Throat Denies history of Chronic sinus problems/congestion, Middle ear problems Hematologic/Lymphatic Denies history of Anemia, Hemophilia, Human Immunodeficiency Virus, Lymphedema, Sickle Cell Disease Respiratory Denies history of Aspiration, Asthma, Chronic Obstructive Pulmonary Disease (COPD), Pneumothorax, Sleep Apnea, Tuberculosis Cardiovascular Patient has history of Congestive Heart Failure, Hypertension Denies history of Angina, Arrhythmia, Coronary Artery Disease, Deep Vein Thrombosis, Hypotension, Myocardial Infarction, Peripheral Arterial Disease, Peripheral Venous Disease, Phlebitis, Vasculitis Gastrointestinal Denies history of Cirrhosis , Colitis, Crohnoos, Hepatitis A, Hepatitis B, Hepatitis C Endocrine Patient has history of Type II Diabetes Denies history of Type I Diabetes Immunological Denies history of Lupus Erythematosus, Raynaudoos, Scleroderma Integumentary (Skin) Denies history of History of Burn Musculoskeletal Patient has history of Rheumatoid Arthritis Denies history of Gout, Osteoarthritis, Osteomyelitis Neurologic Patient has history of Neuropathy Denies history of Dementia, Quadriplegia, Paraplegia, Seizure Disorder Oncologic Denies history of Received Chemotherapy, Received Radiation Psychiatric Denies history of Anorexia/bulimia, Confinement  Anxiety Hospitalization/Surgery History - cataract extraction bilateral. - tonsillectomy. Medical A Surgical History Notes nd Gastrointestinal GERD Objective Constitutional respirations regular, non-labored and within target range for patient.. Vitals Time Taken: 11:29 AM, Height: 70 in, Weight: 148 lbs, BMI: 21.2, Temperature: 97.6 F, Pulse: 55 bpm, Respiratory Rate: 18 breaths/min, Blood Pressure: 145/66 mmHg, Capillary Blood Glucose: 81 mg/dl. Cardiovascular no clubbing, cyanosis, significant edema, Psychiatric pleasant and cooperative. General Notes: Left lower extremity with no open wounds and epithelialization to previous wound sites. Right lower extremity with 1 small open wound with excellent granulation tissue present. No signs of infection to lower extremities bilaterally. No blisters noted bilaterally. Integumentary (Hair, Skin) Wound #2 status is Open. Original cause of wound was Blister. The date acquired was: 09/10/2020. The wound has been in treatment 3 weeks. The wound is located on the Right,Circumferential Lower Leg. The wound measures 0.1cm length x 0.1cm width x 0.1cm depth; 0.008cm^2 area and 0.001cm^3 volume. There is Fat Layer (Subcutaneous Tissue) exposed. There is no tunneling or undermining noted. There is a small amount of serosanguineous drainage noted. The wound margin  is distinct with the outline attached to the wound base. There is large (67-100%) red granulation within the wound bed. There is no necrotic tissue within the wound bed. Wound #4 status is Open. Original cause of wound was Blister. The date acquired was: 09/10/2020. The wound has been in treatment 3 weeks. The wound is located on the Left,Circumferential Lower Leg. The wound measures 0cm length x 0cm width x 0cm depth; 0cm^2 area and 0cm^3 volume. 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 All other wounds have healed. I think at this time he can use collagen every other day and keep it covered. I will not do the wraps anymore. He can follow-up in 2 weeks. if the wound is healed I told him he does not have to follow-up but just let our clinic know. Plan Follow-up Appointments: Return Appointment in 2 weeks. - Dr. Heber [place]. if wound is closed before coming in please call wound center. Bathing/ Shower/ Hygiene: May shower and wash wound with soap and water. - with dressing changes. Edema Control - Lymphedema / SCD / Other: Elevate legs to the level of the heart or above for 30 minutes daily and/or when sitting, a frequency of: Avoid standing for long periods of time. Exercise regularly Moisturize legs daily. - every night before bed. Additional Orders / Instructions: Other: - ensure to wear socks daily to protect skin. Home Health: [facility] for wound care. - [facility]. WOUND #2: - Lower Leg Wound Laterality: Right, Circumferential Cleanser: Soap and Water [facility]) Every Other Day/15 Days Discharge Instructions: May shower and wash wound with dial antibacterial soap and water prior to dressing change. Prim Dressing: Promogran Prisma Matrix, 4.34 (sq in) (silver collagen) (Home Health) Every Other Day/15 Days ary Discharge Instructions: Moisten collagen with saline or hydrogel Secondary Dressing: Zetuvit Plus Silicone Border Dressing 4x4 (in/in) Every Other Day/15 Days Discharge Instructions: ***Apply use bandaid.*** Apply silicone border over primary dressing as directed. 1. Collagen every other day 2. Follow-up in 2 weeks Electronic Signature(s) Signed: 10/20/2020 11:56:49 AM By:  Kalman Shan DO Entered By: Kalman Shan on 10/20/2020 11:56:09 -------------------------------------------------------------------------------- HxROS Details Patient Name: Date of Service: BRO [place], [place] 10/20/2020 11:00 A M Medical Record Number: [id number] Patient Account Number: [id number] Date of Birth/Sex: Treating RN: [birth year] (60 y.o. [name] Primary Care Provider: [name] Other Clinician: Referring Provider: Treating Provider/Extender: [name] in Treatment: 3 Information Obtained From Patient Eyes Medical History: Negative for: Cataracts; Glaucoma; Optic Neuritis Ear/Nose/Mouth/Throat Medical History: Negative for: Chronic sinus problems/congestion; Middle ear problems Hematologic/Lymphatic Medical History: Negative for: Anemia; Hemophilia; Human Immunodeficiency Virus; Lymphedema; Sickle Cell Disease Respiratory Medical History: Negative for: Aspiration; Asthma; Chronic Obstructive Pulmonary Disease (COPD); Pneumothorax; Sleep Apnea; Tuberculosis Cardiovascular Medical History: Positive for: Congestive Heart Failure; Hypertension Negative for: Angina; Arrhythmia; Coronary Artery Disease; Deep Vein Thrombosis; Hypotension; Myocardial Infarction; Peripheral Arterial Disease; Peripheral Venous Disease; Phlebitis; Vasculitis Gastrointestinal Medical History: Negative for: Cirrhosis ; Colitis; Crohns; Hepatitis A; Hepatitis B; Hepatitis C Past Medical History Notes: GERD Endocrine Medical History: Positive for: Type II Diabetes Negative for: Type I Diabetes Time with diabetes: 2 years Treated with: Oral agents Blood sugar tested every day:  Yes Tested : Immunological Medical History: Negative for: Lupus Erythematosus; Raynauds; Scleroderma Integumentary (Skin) Medical History: Negative for: History of Burn Musculoskeletal Medical History: Positive for: Rheumatoid Arthritis Negative for: Gout;  Osteoarthritis; Osteomyelitis Neurologic Medical History: Positive for: Neuropathy Negative for: Dementia; Quadriplegia; Paraplegia; Seizure Disorder Oncologic Medical History: Negative for: Received Chemotherapy; Received Radiation Psychiatric Medical History: Negative for: Anorexia/bulimia; Confinement Anxiety Immunizations Pneumococcal Vaccine: Received Pneumococcal Vaccination: No Implantable Devices None Hospitalization / Surgery History Type of Hospitalization/Surgery cataract extraction bilateral tonsillectomy Family and Social History Cancer: Yes - Father; Diabetes: Yes - Mother; Heart Disease: No; Hereditary Spherocytosis: No; Hypertension: Yes - Mother; Kidney Disease: No; Lung Disease: No; Seizures: No; Stroke: No; Thyroid Problems: No; Tuberculosis: No; Never smoker; Marital Status - Single; Alcohol Use: Never; Drug Use: No History; Caffeine Use: Never; Financial Concerns: No; Food, Clothing or Shelter Needs: No; Support System Lacking: No; Transportation Concerns: No Electronic Signature(s) Signed: 10/20/2020 11:56:49 AM By: Kalman Shan DO Signed: 10/20/2020 6:10:24 PM By: Deon Pilling Entered By: Kalman Shan on 10/20/2020 11:52:46 -------------------------------------------------------------------------------- SuperBill Details Patient Name: Date of Service: [name], [place] 10/20/2020 Medical Record Number: [id number] Patient Account Number: [id number] Date of Birth/Sex: Treating RN: 1960-11-30 (60 y.o. Lorette Ang, Meta.Reding Primary Care Provider: [name] Other Clinician: Referring Provider: Treating Provider/Extender: [name] in Treatment: 3 Diagnosis Coding ICD-10 Codes Code Description [id number] Non-pressure chronic ulcer of other part of right lower leg with unspecified severity L97.829 Non-pressure chronic ulcer of other part of left lower leg with unspecified severity E11.65 Type 2 diabetes mellitus with  hyperglycemia N04.9 Nephrotic syndrome with unspecified morphologic changes I10 Essential (primary) hypertension Facility Procedures CPT4 Code: [id number] Description: 99214 - WOUND CARE VISIT-LEV 4 EST PT Modifier: Quantity: 1 Physician Procedures : CPT4 Code Description Modifier [id number] - WC PHYS LEVEL 3 - EST PT ICD-10 Diagnosis Description L97.819 Non-pressure chronic ulcer of other part of right lower leg with unspecified severity L97.829 Non-pressure chronic ulcer of other part of left  lower leg with unspecified severity Quantity: 1 Electronic Signature(s) Signed: 10/20/2020 6:10:24 PM By: Deon Pilling Signed: 10/23/2020 2:47:44 PM By: Kalman Shan DO Previous Signature: 10/20/2020 11:56:49 AM Version By: Kalman Shan DO Entered By: Deon Pilling on 10/20/2020 18:03:55

## 2020-11-03 ENCOUNTER — Encounter (HOSPITAL_BASED_OUTPATIENT_CLINIC_OR_DEPARTMENT_OTHER): Payer: Medicare HMO | Admitting: Internal Medicine

## 2020-11-04 DIAGNOSIS — I10 Essential (primary) hypertension: Secondary | ICD-10-CM | POA: Diagnosis not present

## 2020-11-04 DIAGNOSIS — E1169 Type 2 diabetes mellitus with other specified complication: Secondary | ICD-10-CM | POA: Diagnosis not present

## 2020-11-04 DIAGNOSIS — E785 Hyperlipidemia, unspecified: Secondary | ICD-10-CM | POA: Diagnosis not present

## 2020-11-04 DIAGNOSIS — N049 Nephrotic syndrome with unspecified morphologic changes: Secondary | ICD-10-CM | POA: Diagnosis not present

## 2020-11-05 DIAGNOSIS — N184 Chronic kidney disease, stage 4 (severe): Secondary | ICD-10-CM | POA: Diagnosis not present

## 2020-11-05 DIAGNOSIS — N189 Chronic kidney disease, unspecified: Secondary | ICD-10-CM | POA: Diagnosis not present

## 2020-11-05 DIAGNOSIS — D631 Anemia in chronic kidney disease: Secondary | ICD-10-CM | POA: Diagnosis not present

## 2020-11-05 DIAGNOSIS — N179 Acute kidney failure, unspecified: Secondary | ICD-10-CM | POA: Diagnosis not present

## 2020-11-05 DIAGNOSIS — I5042 Chronic combined systolic (congestive) and diastolic (congestive) heart failure: Secondary | ICD-10-CM | POA: Diagnosis not present

## 2020-11-05 DIAGNOSIS — E785 Hyperlipidemia, unspecified: Secondary | ICD-10-CM | POA: Diagnosis not present

## 2020-11-05 DIAGNOSIS — E1122 Type 2 diabetes mellitus with diabetic chronic kidney disease: Secondary | ICD-10-CM | POA: Diagnosis not present

## 2020-11-12 DIAGNOSIS — E113211 Type 2 diabetes mellitus with mild nonproliferative diabetic retinopathy with macular edema, right eye: Secondary | ICD-10-CM | POA: Diagnosis not present

## 2020-11-12 DIAGNOSIS — Z7984 Long term (current) use of oral hypoglycemic drugs: Secondary | ICD-10-CM | POA: Diagnosis not present

## 2020-11-12 DIAGNOSIS — Z961 Presence of intraocular lens: Secondary | ICD-10-CM | POA: Diagnosis not present

## 2020-11-12 DIAGNOSIS — E113512 Type 2 diabetes mellitus with proliferative diabetic retinopathy with macular edema, left eye: Secondary | ICD-10-CM | POA: Diagnosis not present

## 2020-11-20 ENCOUNTER — Other Ambulatory Visit: Payer: Self-pay

## 2020-11-20 ENCOUNTER — Ambulatory Visit (HOSPITAL_COMMUNITY): Payer: Medicare HMO | Attending: Cardiology

## 2020-11-20 DIAGNOSIS — I272 Pulmonary hypertension, unspecified: Secondary | ICD-10-CM

## 2020-11-20 DIAGNOSIS — I429 Cardiomyopathy, unspecified: Secondary | ICD-10-CM | POA: Diagnosis not present

## 2020-11-20 LAB — ECHOCARDIOGRAM COMPLETE
Area-P 1/2: 3.6 cm2
MV M vel: 5.55 m/s
MV Peak grad: 123.2 mmHg
S' Lateral: 4 cm

## 2020-11-22 NOTE — Progress Notes (Addendum)
Echo results: 11/20/2020  Left ventricular function has improved a little bit up to pulm function of 45 to 50%.  Still appears to be very abnormal relaxation/grade 3 diastolic dysfunction with elevated left atrial pressures (interestingly, only mild left atrial dilation.  The right ventricle is mildly enlarged and the function is mildly reduced.  There is severe pulmonary artery pressure elevation estimated at 60 mmHg.  Despite having all these elevated pressures, only mild dilation of the left and right atria.  There does appear to be mildly elevated right atrial pressures though  There is mild to moderate mitral valve regurgitation and mild to moderate aortic valve sclerosis but no stenosis.  I.e. no narrowing.   Comparison(s): 08/29/20 EF 40-45%. PA pressure 41mHg. Current study shows LVEF has improved slightly to 45-50% and improved PA pressures with PASP estimated 646mg.  Overall mild improvement from March.  We did not do heart catheterization the time of the hospitalization because of concerns about kidney function.  In follow-up, may need to consider nonischemic evaluation and even possible right and left heart catheterization.  Can discuss when he is seen in follow-up.  HALeonie ManMD  Lab results from Nephrologist 11/05/2020:  Na+ 143, K+ 4.8, Cl- 103, HCO3-22, BUN 83, Cr 3.5, Glu 91, Ca2+ 8.9, Phos 6.0; Alb 3.8 CBC: W 9.0, H/H 8.7/26.6, Plt 255; A1c 5.3   DaGlenetta HewMD

## 2020-11-25 ENCOUNTER — Encounter (HOSPITAL_COMMUNITY)
Admission: RE | Admit: 2020-11-25 | Discharge: 2020-11-25 | Disposition: A | Discharge status: Home / Self Care | Payer: Medicare HMO | Source: Ambulatory Visit | Attending: Nephrology | Admitting: Nephrology

## 2020-11-25 ENCOUNTER — Other Ambulatory Visit: Payer: Self-pay

## 2020-11-25 DIAGNOSIS — D631 Anemia in chronic kidney disease: Secondary | ICD-10-CM | POA: Diagnosis not present

## 2020-11-25 DIAGNOSIS — N184 Chronic kidney disease, stage 4 (severe): Secondary | ICD-10-CM | POA: Diagnosis not present

## 2020-11-25 LAB — FERRITIN: Ferritin: 94 ng/mL (ref 24–336)

## 2020-11-25 LAB — IRON AND TIBC
Iron: 61 ug/dL (ref 45–182)
Saturation Ratios: 17 % — ABNORMAL LOW (ref 17.9–39.5)
TIBC: 361 ug/dL (ref 250–450)
UIBC: 300 ug/dL

## 2020-11-25 LAB — POCT HEMOGLOBIN-HEMACUE: Hemoglobin: 9.7 g/dL — ABNORMAL LOW (ref 13.0–17.0)

## 2020-11-25 MED ORDER — EPOETIN ALFA-EPBX 10000 UNIT/ML IJ SOLN
20000.0000 [IU] | Freq: Once | INTRAMUSCULAR | Status: AC
  Administered 2020-11-25: 20000 [IU] via SUBCUTANEOUS
  Filled 2020-11-25: qty 2

## 2020-11-25 NOTE — Progress Notes (Addendum)
Cardiology Clinic Note   Patient Name: [name] [name] Date of Encounter: 11/27/2020  Primary Care Provider:  [name], MD Primary Cardiologist:  Glenetta Hew, MD  Patient Profile    [name] [name] 60 year old male presents the clinic today for follow-up evaluation of his cardiomyopathy.  Past Medical History    Past Medical History:  Diagnosis Date   Arthritis    Diabetes mellitus (HCC)    GERD (gastroesophageal reflux disease)    HTN (hypertension)    Hyperlipidemia    Nephrotic syndrome    Obesity    Past Surgical History:  Procedure Laterality Date   CATARACT EXTRACTION, BILATERAL  2018   TONSILLECTOMY      Allergies  No Known Allergies  History of Present Illness    [name] [name] has a PMH of nephrotic syndrome with anasarca, newly recognized cardiomyopathy, severe pulmonary hypertension, moderate mitral regurgitation, essential hypertension, hyponatremia, and diabetes.  He was seen and evaluated by Dr. Ellyn Hack on 09/09/2020.  He was admitted at the hospital at that time.  He denied any prior cardiac history.  His mother was at the bedside.  He had been seen by nephrology due to swelling for several weeks.  He underwent work-up for nephrotic syndrome.  He was noted to have protein urea while undergoing his outpatient work-up.  He had been hospitalized for DOE and swelling in his arms legs and abdomen.  He was found to have pleural effusions, pulmonary edema, and anasarca.  His lower extremity ultrasound was negative.  His labs showed AKI  which is felt to be related to his reduced renal perfusion and ACE inhibitor.  An echocardiogram 08/29/2020 1 day after admission showed an EF of 40-45%, G2 DD, moderately enlarged RV, moderate RV dysfunction, severely elevated PA SP, moderate LAE, and mild RAE, trace pericardial effusion, and dilated IVC.  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  He was aggressively diuresed by nephrology and his weight  was down 10 pounds from admission.  He received IV iron for his anemia.  His labs showed hyponatremia at 128, creatinine 2.57, baseline 1.2-1.7 2021, and hemoglobin of 7.9.  Acei was placed on hold consideration of adding back as an outpatient.  Cardiology was consulted to weigh in on his abnormal echocardiogram.  He denied chest pain, palpitations, and syncope.  He felt short of breath, his swelling was improving.  He was felt to require skilled nursing facility but reported that he wanted to go home.  Plan was made for outpatient echocardiogram about a month after hospitalization.  He presents the clinic today for follow-up evaluation states he continues to improve.  He has moved from being in a wheelchair to using a walker to now using a cane intermittently.  He reports that his strength has returned.  He has had no episodes of chest discomfort and his breathing continues to improve.  We are reviewed his hospitalization and renal function.  He is now working with Dr. Hollie Salk at [facility].  Initially his blood pressure was 156/86 and on recheck was 138/72.  He reports a labile blood pressure at home.  His mother prepares his food at home and chooses low-sodium options.  He reports he had a reaction that appeared to be allergic to [employer].  He worked with the wound center for open ulcers on his lower extremities.  He currently has no ulcerations.  I will  request labs from nephrology, have him wear lower extremity support stockings, maintain low-sodium diet, and  follow-up after his sleep study.  Based off lab results plan to proceed with right and left cardiac catheterization.  Today he denies chest pain, shortness of breath, lower extremity edema, fatigue, palpitations, melena, hematuria, hemoptysis, diaphoresis, weakness, presyncope, syncope, orthopnea, and PND.   Home Medications    Prior to Admission medications   Medication Sig Start Date End Date Taking? Authorizing Provider  acetaminophen  (TYLENOL) 500 MG tablet Take 500 mg by mouth every 6 (six) hours as needed (arthritis).     [provider]  amLODipine (NORVASC) 10 MG tablet Take 1 tablet (10 mg total) by mouth daily. 09/11/20   Dwyane Dee, MD  atorvastatin (LIPITOR) 20 MG tablet TAKE 1 Tablet BY MOUTH ONCE EVERY DAY Patient not taking: Reported on 08/28/2020 06/16/20   Soyla Dryer, PA-C  benzonatate (TESSALON) 100 MG capsule Take 100 mg by mouth 3 (three) times daily as needed for cough. 08/24/20   [provider]  Calcium Carbonate-Simethicone 750-80 MG CHEW Chew 1 tablet by mouth daily as needed (heartburn).    [provider]  famotidine (PEPCID) 20 MG tablet Take 1 tablet (20 mg total) by mouth 2 (two) times daily as needed for heartburn or indigestion. 01/08/18   Soyla Dryer, PA-C  gabapentin (NEURONTIN) 100 MG capsule Take 1 capsule (100 mg total) by mouth daily. 09/10/20   Dwyane Dee, MD  glipiZIDE (GLUCOTROL) 5 MG tablet Take 5 mg by mouth daily. 08/26/20   [provider]  hydrALAZINE (APRESOLINE) 25 MG tablet Take 1 tablet (25 mg total) by mouth every 8 (eight) hours. 09/10/20   Dwyane Dee, MD  isosorbide mononitrate (IMDUR) 30 MG 24 hr tablet Take 1 tablet (30 mg total) by mouth daily. 09/11/20   Dwyane Dee, MD  loratadine (CLARITIN) 10 MG tablet Take 10 mg by mouth daily. 08/24/20   [provider]  metolazone (ZAROXOLYN) 2.5 MG tablet Take 1 tablet (2.5 mg total) by mouth every other day. 09/10/20   Dwyane Dee, MD  metoprolol tartrate (LOPRESSOR) 50 MG tablet Take 1 tablet (50 mg total) by mouth 2 (two) times daily. 06/16/20   Soyla Dryer, PA-C  Omega-3 Fatty Acids (FISH OIL) 1200 MG CAPS Take 1 capsule (1,200 mg total) by mouth 2 (two) times daily. Patient taking differently: Take 1 capsule by mouth at bedtime. 01/08/18   Soyla Dryer, PA-C  potassium chloride SA (KLOR-CON) 20 MEQ tablet Take 1 tablet (20 mEq total) by mouth every other day. Take on  same days with metolazone 09/10/20   Dwyane Dee, MD  torsemide (DEMADEX) 100 MG tablet Take 1 tablet (100 mg total) by mouth daily. 09/11/20   Dwyane Dee, MD    Family History    Family History  Problem Relation Age of Onset   Hypertension Mother    Diabetes Mother    Cancer Father        prostate cancer   Hypertension Maternal Grandfather    Rheum arthritis Maternal Grandmother    CAD Neg Hx    He indicated that his mother is alive. He indicated that his father is deceased. He indicated that his maternal grandmother is deceased. He indicated that the status of his maternal grandfather is unknown. He indicated that the status of his neg hx is unknown.  Social History    Social History   Socioeconomic History   Marital status: Single    Spouse name: Not on file   Number of children: Not on file   Years of education: Not  on file   Highest education level: Not on file  Occupational History   Not on file  Tobacco Use   Smoking status: Never   Smokeless tobacco: Never  Vaping Use   Vaping Use: Never used  Substance and Sexual Activity   Alcohol use: Never   Drug use: Never   Sexual activity: Not on file  Other Topics Concern   Not on file  Social History Narrative   Not on file   Social Determinants of Health   Financial Resource Strain: Not on file  Food Insecurity: Not on file  Transportation Needs: Not on file  Physical Activity: Not on file  Stress: Not on file  Social Connections: Not on file  Intimate Partner Violence: Not on file     Review of Systems    General:  No chills, fever, night sweats or weight changes.  Cardiovascular:  No chest pain, dyspnea on exertion, edema, orthopnea, palpitations, paroxysmal nocturnal dyspnea. Dermatological: No rash, lesions/masses Respiratory: No cough, dyspnea Urologic: No hematuria, dysuria Abdominal:   No nausea, vomiting, diarrhea, bright red blood per rectum, melena, or hematemesis Neurologic:  No visual  changes, wkns, changes in mental status. All other systems reviewed and are otherwise negative except as noted above.  Physical Exam    VS:  BP 138/72 (BP Location: Left Arm, Patient Position: Sitting, Cuff Size: Normal)   Pulse 62   Ht '5\' 10"'$  (1.778 m)   Wt 170 lb 9.6 oz (77.4 kg)   SpO2 96%   BMI 24.48 kg/m  , BMI Body mass index is 24.48 kg/m. GEN: Well nourished, well developed, in no acute distress. HEENT: normal. Neck: Supple, no JVD, carotid bruits, or masses. Cardiac: RRR, no murmurs, rubs, or gallops. No clubbing, cyanosis, generalized nonpitting bilateral lower extremity edema.  Radials/DP/PT 2+ and equal bilaterally.  Respiratory:  Respirations regular and unlabored, clear to auscultation bilaterally. GI: Soft, nontender, nondistended, BS + x 4. MS: no deformity or atrophy. Skin: warm and dry, no rash. Neuro:  Strength and sensation are intact. Psych: Normal affect.  Accessory Clinical Findings    Recent Labs: 08/28/2020: B Natriuretic Peptide 2,878.0 08/29/2020: ALT 19 09/10/2020: BUN 43; Creatinine, Ser 2.63; Magnesium 2.0; Platelets 243; Potassium 3.7; Sodium 129 11/25/2020: Hemoglobin 9.7   Recent Lipid Panel    Component Value Date/Time   CHOL 194 04/27/2020 1118   TRIG 228 (H) 04/27/2020 1118   HDL 46 04/27/2020 1118   CHOLHDL 4.2 04/27/2020 1118   VLDL 46 (H) 04/27/2020 1118   LDLCALC 102 (H) 04/27/2020 1118    ECG personally reviewed by me today-none today.  Echocardiogram 08/29/2020 IMPRESSIONS     1. Left ventricular ejection fraction, by estimation, is 40 to 45%. The  left ventricle has mildly decreased function. The left ventricle  demonstrates global hypokinesis. Left ventricular diastolic parameters are  consistent with Grade II diastolic  dysfunction (pseudonormalization). Elevated left ventricular end-diastolic  pressure.   2. Right ventricular systolic function is moderately reduced. The right  ventricular size is moderately enlarged.  There is severely elevated  pulmonary artery systolic pressure. The estimated right ventricular  systolic pressure is 99991111 mmHg.   3. Left atrial size was moderately dilated.   4. Right atrial size was mildly dilated.   5. The pericardial effusion is circumferential. Large pleural effusion in  the left lateral region.   6. The mitral valve is normal in structure. Moderate mitral valve  regurgitation. No evidence of mitral stenosis.   7. Tricuspid valve  regurgitation is moderate.   8. The aortic valve is normal in structure. Aortic valve regurgitation is  not visualized. Mild to moderate aortic valve sclerosis/calcification is  present, without any evidence of aortic stenosis.   9. The inferior vena cava is dilated in size with <50% respiratory  variability, suggesting right atrial pressure of 15 mmHg.   Echocardiogram 11/20/2020 IMPRESSIONS     1. Left ventricular ejection fraction, by estimation, is 45 to 50%. Left  ventricular ejection fraction by 3D volume is 48 %. The left ventricle has  mildly decreased function. The left ventricle demonstrates global  hypokinesis. Left ventricular diastolic   parameters are consistent with Grade III diastolic dysfunction  (restrictive). Elevated left atrial pressure. The E/e' is 13.   2. Right ventricular systolic function is mildly reduced. The right  ventricular size is mildly enlarged. There is severely elevated pulmonary  artery systolic pressure. The estimated right ventricular systolic  pressure is 0000000 mmHg.   3. Left atrial size was mildly dilated.   4. Right atrial size was mildly dilated.   5. The mitral valve is normal in structure. Mild to moderate mitral valve  regurgitation.   6. The aortic valve is tricuspid. There is mild calcification of the  aortic valve. There is mild thickening of the aortic valve. Aortic valve  regurgitation is not visualized. Mild to moderate aortic valve  sclerosis/calcification is present, without any   evidence of aortic stenosis.   7. The inferior vena cava is normal in size with <50% respiratory  variability, suggesting right atrial pressure of 8 mmHg.   Comparison(s): 08/29/20 EF 40-45%. PA pressure 22mHg. Current TTE shows  LVEF has improved slightly to 45-50% with PASP 665mg.   Assessment & Plan   1.  New onset cardiomyopathy-repeat echocardiogram showed slight improvement in EF from 40-45% to 45-50%.  Felt to be possibly related to ischemia.  Ischemic evaluation recommended, previously deferred due to renal function.  Feels much improved since hospitalization.  Continues to increase his physical activity.  Will await renal labs before scheduling catheterization. Continue torsemide, Lopressor, Imdur, hydralazine, amlodipine, metolazone Heart healthy low-sodium diet-salty 6 given Increase physical activity as tolerated Lower extremity support stockings-[place] support stocking sheet given Daily weights-contact office with a weight increase of 3 pounds overnight or 5 pounds in 1 week [facility]  Severe pulmonary hypertension-reports snoring.  Continues with fatigue and poor sleep.  Reports daytime somnolence.  Echocardiogram showed PASP 60 mmHg Continue torsemide, metolazone Heart healthy low-sodium diet-salty 6 given Increase physical activity as tolerated Order sleep study  Essential hypertension-BP today 138/72.  Better control at home. Continue torsemide, Lopressor, hydralazine, amlodipine, Imdur Heart healthy low-sodium diet-salty 6 given Increase physical activity as tolerated  Anemia-hemoglobin 9.7 on 11/25/2020.  Showing upward trend. Follows with PCP  CKD-creatinine 2.63 on 09/10/2020.  Previously deferred cardiac catheterization due to worsening renal function.   Disposition: Follow-up with Dr. HaEllyn Hackr me after sleep study  JeJossie NgCleaver NP-C    11/27/2020, 12:10 PM [email or web address]  [address] ([phone] ([phone]: This dictation was prepared with Dragon dictation along with smaller phrase technology. Any transcriptional errors that result from this process are unintentional and may not be corrected upon review.  I spent 15 minutes examining this patient, reviewing medications, and using patient centered shared decision making involving her cardiac care.  Prior to her visit I spent greater than 20 minutes reviewing her past medical  history,  medications, and prior cardiac tests.

## 2020-11-27 ENCOUNTER — Other Ambulatory Visit: Payer: Self-pay

## 2020-11-27 ENCOUNTER — Encounter: Payer: Self-pay | Admitting: General Practice

## 2020-11-27 ENCOUNTER — Ambulatory Visit (INDEPENDENT_AMBULATORY_CARE_PROVIDER_SITE_OTHER): Payer: Medicare HMO | Admitting: General Practice

## 2020-11-27 VITALS — BP 138/72 | HR 62 | Ht 70.0 in | Wt 170.6 lb

## 2020-11-27 DIAGNOSIS — D638 Anemia in other chronic diseases classified elsewhere: Secondary | ICD-10-CM | POA: Diagnosis not present

## 2020-11-27 DIAGNOSIS — I10 Essential (primary) hypertension: Secondary | ICD-10-CM | POA: Diagnosis not present

## 2020-11-27 DIAGNOSIS — N183 Chronic kidney disease, stage 3 unspecified: Secondary | ICD-10-CM | POA: Insufficient documentation

## 2020-11-27 DIAGNOSIS — I272 Pulmonary hypertension, unspecified: Secondary | ICD-10-CM | POA: Diagnosis not present

## 2020-11-27 DIAGNOSIS — I429 Cardiomyopathy, unspecified: Secondary | ICD-10-CM

## 2020-11-27 NOTE — Patient Instructions (Signed)
Medication Instructions:  The current medical regimen is effective;  continue present plan and medications as directed. Please refer to the Current Medication list given to you today.  *If you need a refill on your cardiac medications before your next appointment, please call your pharmacy*  Lab Work: NONE  Testing/Procedures: Echocardiogram - Your physician has requested that you have an echocardiogram. Echocardiography is a painless test that uses sound waves to create images of your heart. It provides your doctor with information about the size and shape of your heart and how well your heart's chambers and valves are working. This procedure takes approximately one hour. There are no restrictions for this procedure. This will be performed at our [facility] location - [address], [address].  Your physician has recommended that you have a sleep study. This test records several body functions during sleep, including: brain activity, eye movement, oxygen and carbon dioxide blood levels, heart rate and rhythm, breathing rate and rhythm, the flow of air through your mouth and nose, snoring, body muscle movements, and chest and belly movement.  Special Instructions PLEASE READ AND FOLLOW SALTY 6-ATTACHED-1,800 mg daily  PLEASE INCREASE PHYSICAL ACTIVITY AS TOLERATED  Follow-Up: Your next appointment:  AFTER TESTING/ 1st AVAILABLE  In Person with Glenetta Hew, MD OR IF UNAVAILABLE [place], FNP-C   At [facility], you and your health needs are our priority.  As part of our continuing mission to provide you with exceptional heart care, we have created designated Provider Care Teams.  These Care Teams include your primary Cardiologist (physician) and Advanced Practice Providers (APPs -  Physician Assistants and Nurse Practitioners) who all work together to provide you with the care you need, when you need it.  We recommend signing up for the patient portal called "MyChart".  Sign up  information is provided on this After Visit Summary.  MyChart is used to connect with patients for Virtual Visits (Telemedicine).  Patients are able to view lab/test results, encounter notes, upcoming appointments, etc.  Non-urgent messages can be sent to your provider as well.   To learn more about what you can do with MyChart, go to [email or web address].

## 2020-11-27 NOTE — Addendum Note (Signed)
Addended by: Waylan Rocher on: 11/27/2020 04:25 PM   Modules accepted: Orders

## 2020-11-27 NOTE — Addendum Note (Signed)
Addended by: Deberah Pelton on: 11/27/2020 12:38 PM   Modules accepted: Orders

## 2020-12-10 DIAGNOSIS — E113512 Type 2 diabetes mellitus with proliferative diabetic retinopathy with macular edema, left eye: Secondary | ICD-10-CM | POA: Diagnosis not present

## 2020-12-10 DIAGNOSIS — E113211 Type 2 diabetes mellitus with mild nonproliferative diabetic retinopathy with macular edema, right eye: Secondary | ICD-10-CM | POA: Diagnosis not present

## 2020-12-10 DIAGNOSIS — Z7984 Long term (current) use of oral hypoglycemic drugs: Secondary | ICD-10-CM | POA: Diagnosis not present

## 2020-12-15 ENCOUNTER — Other Ambulatory Visit: Payer: Self-pay | Admitting: Cardiology

## 2020-12-15 NOTE — Progress Notes (Signed)
  Lab results from Nephrologist 11/05/2020:  Na+ 143, K+ 4.8, Cl- 103, HCO3-22, BUN 83, Cr 3.5, Glu 91, Ca2+ 8.9, Phos 6.0; Alb 3.8 CBC: W 9.0, H/H 8.7/26.6, Plt 255; A1c 5.3   Glenetta Hew, MD

## 2020-12-23 ENCOUNTER — Encounter (HOSPITAL_COMMUNITY)
Admission: RE | Admit: 2020-12-23 | Discharge: 2020-12-23 | Disposition: A | Discharge status: Home / Self Care | Payer: Medicare HMO | Source: Ambulatory Visit | Attending: Nephrology | Admitting: Nephrology

## 2020-12-23 ENCOUNTER — Encounter (HOSPITAL_COMMUNITY): Payer: Self-pay

## 2020-12-23 ENCOUNTER — Other Ambulatory Visit: Payer: Self-pay

## 2020-12-23 DIAGNOSIS — D631 Anemia in chronic kidney disease: Secondary | ICD-10-CM | POA: Diagnosis not present

## 2020-12-23 DIAGNOSIS — N184 Chronic kidney disease, stage 4 (severe): Secondary | ICD-10-CM | POA: Insufficient documentation

## 2020-12-23 LAB — IRON AND TIBC
Iron: 68 ug/dL (ref 45–182)
Saturation Ratios: 19 % (ref 17.9–39.5)
TIBC: 357 ug/dL (ref 250–450)
UIBC: 289 ug/dL

## 2020-12-23 LAB — FERRITIN: Ferritin: 93 ng/mL (ref 24–336)

## 2020-12-23 LAB — POCT HEMOGLOBIN-HEMACUE: Hemoglobin: 10.8 g/dL — ABNORMAL LOW (ref 13.0–17.0)

## 2020-12-23 MED ORDER — EPOETIN ALFA-EPBX 10000 UNIT/ML IJ SOLN
20000.0000 [IU] | Freq: Once | INTRAMUSCULAR | Status: AC
  Administered 2020-12-23: 20000 [IU] via SUBCUTANEOUS
  Filled 2020-12-23: qty 2

## 2020-12-25 ENCOUNTER — Other Ambulatory Visit: Payer: Self-pay

## 2020-12-25 ENCOUNTER — Ambulatory Visit (HOSPITAL_COMMUNITY): Payer: Medicare HMO | Attending: Cardiovascular Disease

## 2020-12-25 DIAGNOSIS — I340 Nonrheumatic mitral (valve) insufficiency: Secondary | ICD-10-CM | POA: Insufficient documentation

## 2020-12-25 DIAGNOSIS — E785 Hyperlipidemia, unspecified: Secondary | ICD-10-CM | POA: Insufficient documentation

## 2020-12-25 DIAGNOSIS — I10 Essential (primary) hypertension: Secondary | ICD-10-CM | POA: Diagnosis not present

## 2020-12-25 DIAGNOSIS — E119 Type 2 diabetes mellitus without complications: Secondary | ICD-10-CM | POA: Diagnosis not present

## 2020-12-25 DIAGNOSIS — I272 Pulmonary hypertension, unspecified: Secondary | ICD-10-CM

## 2020-12-25 DIAGNOSIS — I429 Cardiomyopathy, unspecified: Secondary | ICD-10-CM | POA: Diagnosis not present

## 2020-12-25 LAB — ECHOCARDIOGRAM COMPLETE
AR max vel: 2.24 cm2
AV Area VTI: 2.23 cm2
AV Area mean vel: 2.12 cm2
AV Mean grad: 4 mmHg
AV Peak grad: 7.4 mmHg
Ao pk vel: 1.36 m/s
Area-P 1/2: 2.56 cm2
S' Lateral: 4.1 cm

## 2021-01-07 DIAGNOSIS — D631 Anemia in chronic kidney disease: Secondary | ICD-10-CM | POA: Diagnosis not present

## 2021-01-07 DIAGNOSIS — I5042 Chronic combined systolic (congestive) and diastolic (congestive) heart failure: Secondary | ICD-10-CM | POA: Diagnosis not present

## 2021-01-07 DIAGNOSIS — N184 Chronic kidney disease, stage 4 (severe): Secondary | ICD-10-CM | POA: Diagnosis not present

## 2021-01-07 DIAGNOSIS — N2581 Secondary hyperparathyroidism of renal origin: Secondary | ICD-10-CM | POA: Diagnosis not present

## 2021-01-07 DIAGNOSIS — E1122 Type 2 diabetes mellitus with diabetic chronic kidney disease: Secondary | ICD-10-CM | POA: Diagnosis not present

## 2021-01-07 DIAGNOSIS — I129 Hypertensive chronic kidney disease with stage 1 through stage 4 chronic kidney disease, or unspecified chronic kidney disease: Secondary | ICD-10-CM | POA: Diagnosis not present

## 2021-01-10 NOTE — Progress Notes (Signed)
Cardiology Clinic Note   Patient Name: [name] [name] Date of Encounter: 01/12/2021  Primary Care Provider:  [name], MD Primary Cardiologist:  Glenetta Hew, MD  Patient Profile    [name] [name] 60 year old male presents the clinic today for follow-up evaluation of his cardiomyopathy.  Past Medical History    Past Medical History:  Diagnosis Date   Arthritis    Diabetes mellitus (HCC)    GERD (gastroesophageal reflux disease)    HTN (hypertension)    Hyperlipidemia    Nephrotic syndrome    Obesity    Past Surgical History:  Procedure Laterality Date   CATARACT EXTRACTION, BILATERAL  2018   TONSILLECTOMY      Allergies  No Known Allergies  History of Present Illness    Mr. Mayol has a PMH of nephrotic syndrome with anasarca, newly recognized cardiomyopathy, severe pulmonary hypertension, moderate mitral regurgitation, essential hypertension, hyponatremia, and diabetes.   He was seen and evaluated by Dr. Ellyn Hack on 09/09/2020.  He was admitted at the hospital at that time.  He denied any prior cardiac history.  His mother was at the bedside.  He had been seen by nephrology due to swelling for several weeks.  He underwent work-up for nephrotic syndrome.  He was noted to have protein urea while undergoing his outpatient work-up.  He had been hospitalized for DOE and swelling in his arms legs and abdomen.  He was found to have pleural effusions, pulmonary edema, and anasarca.  His lower extremity ultrasound was negative.  His labs showed AKI  which is felt to be related to his reduced renal perfusion and ACE inhibitor.  An echocardiogram 08/29/2020 1 day after admission showed an EF of 40-45%, G2 DD, moderately enlarged RV, moderate RV dysfunction, severely elevated PA SP, moderate LAE, and mild RAE, trace pericardial effusion, and dilated IVC.  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  He was aggressively diuresed by nephrology and his weight  was down 10 pounds from admission.  He received IV iron for his anemia.  His labs showed hyponatremia at 128, creatinine 2.57, baseline 1.2-1.7 2021, and hemoglobin of 7.9.   Acei was placed on hold consideration of adding back as an outpatient.  Cardiology was consulted to weigh in on his abnormal echocardiogram.  He denied chest pain, palpitations, and syncope.  He felt short of breath, his swelling was improving.  He was felt to require skilled nursing facility but reported that he wanted to go home.  Plan was made for outpatient echocardiogram about a month after hospitalization.   He presented to the clinic 11/27/20 for follow-up evaluation stated he continued to improve.  He had moved from being in a wheelchair to using a walker to now using a cane intermittently.  He reported that his strength had returned.  He had no episodes of chest discomfort and his breathing continued to improve.  We are reviewed his hospitalization and renal function.  He is now working with Dr. Hollie Salk at [facility].  Initially his blood pressure was 156/86 and on recheck was 138/72.  He reported a labile blood pressure at home.  His mother prepared his food at home and choose low-sodium options.  He reported he had a reaction that appeared to be allergic to [employer].  He worked with the wound center for open ulcers on his lower extremities.  He had no ulcerations at the time of the visit.  I  requested labs from nephrology, had him wear lower  extremity support stockings, maintain low-sodium diet, and follow-up after his sleep study.  Based off lab results plan to proceed with right and left cardiac catheterization.Repeat Echo showed an EF of 55-60, G2 DD, mild left atrial dilation, right atria mildly dilated, mild mitral valve dilation.  He presents to the clinic today for follow up evaluation and states he continues to increase his physical activity and is walking more through the day.  He continues to ambulate with a cane  for extra support if needed.  He denies increased work of breathing and reports that he feels much improved.  His mother continues to prepare his meals and does not cook with salt.  He has been following with Dr. Hollie Salk at [facility].  We will defer cardiac catheterization at this time due to lack of symptoms and renal function.  We reviewed his most recent echocardiogram and he expressed understanding.  I will have him continue to increase his physical activity, continue his heart healthy low-sodium diet, and follow-up in 6 months.   Today he denies chest pain, shortness of breath, lower extremity edema, fatigue, palpitations, melena, hematuria, hemoptysis, diaphoresis, weakness, presyncope, syncope, orthopnea, and PND.  Home Medications    Prior to Admission medications   Medication Sig Start Date End Date Taking? Authorizing Provider  Accu-Chek Softclix Lancets lancets  08/06/20   [provider]  acetaminophen (TYLENOL) 500 MG tablet Take 500 mg by mouth every 6 (six) hours as needed (arthritis).     [provider]  amLODipine (NORVASC) 10 MG tablet Take 1 tablet (10 mg total) by mouth daily. 09/11/20   Dwyane Dee, MD  atorvastatin (LIPITOR) 10 MG tablet Take 10 mg by mouth daily. 09/23/20   [provider]  benzonatate (TESSALON) 100 MG capsule Take 100 mg by mouth 3 (three) times daily as needed for cough. 08/24/20   [provider]  Blood Glucose Monitoring Suppl (ACCU-CHEK GUIDE) w/Device KIT in the morning. 08/06/20   [provider]  Calcium Carbonate-Simethicone 750-80 MG CHEW Chew 1 tablet by mouth daily as needed (heartburn).    [provider]  famotidine (PEPCID) 20 MG tablet Take 1 tablet (20 mg total) by mouth 2 (two) times daily as needed for heartburn or indigestion. 01/08/18   Soyla Dryer, PA-C  gabapentin (NEURONTIN) 100 MG capsule Take 100 mg by mouth 2 (two) times daily.    [provider]  glipiZIDE  (GLUCOTROL) 5 MG tablet Take 5 mg by mouth daily. 08/26/20   [provider]  hydrALAZINE (APRESOLINE) 25 MG tablet Take 1 tablet (25 mg total) by mouth every 8 (eight) hours. 09/10/20   Dwyane Dee, MD  isosorbide mononitrate (IMDUR) 30 MG 24 hr tablet Take 1 tablet (30 mg total) by mouth daily. 09/11/20   Dwyane Dee, MD  loratadine (CLARITIN) 10 MG tablet Take 10 mg by mouth daily. 08/24/20   [provider]  metolazone (ZAROXOLYN) 2.5 MG tablet Take 1 tablet (2.5 mg total) by mouth every other day. 09/10/20   Dwyane Dee, MD  metoprolol tartrate (LOPRESSOR) 50 MG tablet Take 1 tablet (50 mg total) by mouth 2 (two) times daily. 06/16/20   Soyla Dryer, PA-C  torsemide (DEMADEX) 100 MG tablet Take 1 tablet (100 mg total) by mouth daily. 09/11/20   Dwyane Dee, MD  vitamin B-12 (CYANOCOBALAMIN) 1000 MCG tablet Take 1,000 mcg by mouth daily.    [provider]    Family History    Family History  Problem Relation Age  of Onset   Hypertension Mother    Diabetes Mother    Cancer Father        prostate cancer   Hypertension Maternal Grandfather    Rheum arthritis Maternal Grandmother    CAD Neg Hx    He indicated that his mother is alive. He indicated that his father is deceased. He indicated that his maternal grandmother is deceased. He indicated that the status of his maternal grandfather is unknown. He indicated that the status of his neg hx is unknown.  Social History    Social History   Socioeconomic History   Marital status: Single    Spouse name: Not on file   Number of children: Not on file   Years of education: Not on file   Highest education level: Not on file  Occupational History   Not on file  Tobacco Use   Smoking status: Never   Smokeless tobacco: Never  Vaping Use   Vaping Use: Never used  Substance and Sexual Activity   Alcohol use: Never   Drug use: Never   Sexual activity: Not on file  Other Topics Concern   Not on file   Social History Narrative   Not on file   Social Determinants of Health   Financial Resource Strain: Not on file  Food Insecurity: Not on file  Transportation Needs: Not on file  Physical Activity: Not on file  Stress: Not on file  Social Connections: Not on file  Intimate Partner Violence: Not on file     Review of Systems    General:  No chills, fever, night sweats or weight changes.  Cardiovascular:  No chest pain, dyspnea on exertion, edema, orthopnea, palpitations, paroxysmal nocturnal dyspnea. Dermatological: No rash, lesions/masses Respiratory: No cough, dyspnea Urologic: No hematuria, dysuria Abdominal:   No nausea, vomiting, diarrhea, bright red blood per rectum, melena, or hematemesis Neurologic:  No visual changes, wkns, changes in mental status. All other systems reviewed and are otherwise negative except as noted above.  Physical Exam    VS:  BP 132/64   Pulse (!) 54   Ht _0  (1.778 m)   Wt 173 lb (78.5 kg)   SpO2 97%   BMI 24.82 kg/m  , BMI Body mass index is 24.82 kg/m. GEN: Well nourished, well developed, in no acute distress. HEENT: normal. Neck: Supple, no JVD, carotid bruits, or masses. Cardiac: RRR, no murmurs, rubs, or gallops. No clubbing, cyanosis, edema.  Radials/DP/PT 2+ and equal bilaterally.  Respiratory:  Respirations regular and unlabored, clear to auscultation bilaterally. GI: Soft, nontender, nondistended, BS + x 4. MS: no deformity or atrophy. Skin: warm and dry, no rash. Neuro:  Strength and sensation are intact. Psych: Normal affect.  Accessory Clinical Findings    Recent Labs: 08/28/2020: B Natriuretic Peptide 2,878.0 08/29/2020: ALT 19 09/10/2020: BUN 43; Creatinine, Ser 2.63; Magnesium 2.0; Platelets 243; Potassium 3.7; Sodium 129 12/23/2020: Hemoglobin 10.8   Recent Lipid Panel    Component Value Date/Time   CHOL 194 04/27/2020 1118   TRIG 228 (H) 04/27/2020 1118   HDL 46 04/27/2020 1118   CHOLHDL 4.2 04/27/2020 1118    VLDL 46 (H) 04/27/2020 1118   LDLCALC 102 (H) 04/27/2020 1118    ECG personally reviewed by me today-none today.  Echocardiogram 08/29/2020 IMPRESSIONS     1. Left ventricular ejection fraction, by estimation, is 40 to 45%. The  left ventricle has mildly decreased function. The left ventricle  demonstrates global hypokinesis. Left ventricular diastolic parameters  are  consistent with Grade II diastolic  dysfunction (pseudonormalization). Elevated left ventricular end-diastolic  pressure.   2. Right ventricular systolic function is moderately reduced. The right  ventricular size is moderately enlarged. There is severely elevated  pulmonary artery systolic pressure. The estimated right ventricular  systolic pressure is 17.7 mmHg.   3. Left atrial size was moderately dilated.   4. Right atrial size was mildly dilated.   5. The pericardial effusion is circumferential. Large pleural effusion in  the left lateral region.   6. The mitral valve is normal in structure. Moderate mitral valve  regurgitation. No evidence of mitral stenosis.   7. Tricuspid valve regurgitation is moderate.   8. The aortic valve is normal in structure. Aortic valve regurgitation is  not visualized. Mild to moderate aortic valve sclerosis/calcification is  present, without any evidence of aortic stenosis.   9. The inferior vena cava is dilated in size with <50% respiratory  variability, suggesting right atrial pressure of 15 mmHg.   Echocardiogram 11/20/2020 IMPRESSIONS     1. Left ventricular ejection fraction, by estimation, is 45 to 50%. Left  ventricular ejection fraction by 3D volume is 48 %. The left ventricle has  mildly decreased function. The left ventricle demonstrates global  hypokinesis. Left ventricular diastolic   parameters are consistent with Grade III diastolic dysfunction  (restrictive). Elevated left atrial pressure. The E/e' is 17.   2. Right ventricular systolic function is mildly  reduced. The right  ventricular size is mildly enlarged. There is severely elevated pulmonary  artery systolic pressure. The estimated right ventricular systolic  pressure is 93.9 mmHg.   3. Left atrial size was mildly dilated.   4. Right atrial size was mildly dilated.   5. The mitral valve is normal in structure. Mild to moderate mitral valve  regurgitation.   6. The aortic valve is tricuspid. There is mild calcification of the  aortic valve. There is mild thickening of the aortic valve. Aortic valve  regurgitation is not visualized. Mild to moderate aortic valve  sclerosis/calcification is present, without any  evidence of aortic stenosis.   7. The inferior vena cava is normal in size with <50% respiratory  variability, suggesting right atrial pressure of 8 mmHg.   Comparison(s): 08/29/20 EF 40-45%. PA pressure 19mHg. Current TTE shows  LVEF has improved slightly to 45-50% with PASP 65mg.  Echocardiogram 12/25/2020  IMPRESSIONS     1. Left ventricular ejection fraction, by estimation, is 55 to 60%. The  left ventricle has normal function. The left ventricle has no regional  wall motion abnormalities. Left ventricular diastolic parameters are  consistent with Grade II diastolic  dysfunction (pseudonormalization). The average left ventricular global  longitudinal strain is -22.0 %. The global longitudinal strain is normal.   2. Right ventricular systolic function is normal. The right ventricular  size is normal.   3. Left atrial size was mild to moderately dilated.   4. Right atrial size was mildly dilated.   5. The mitral valve is normal in structure. Mild mitral valve  regurgitation.   6. The aortic valve is grossly normal. There is mild thickening of the  aortic valve. Aortic valve regurgitation is not visualized. No aortic  stenosis is present.  Assessment & Plan   1.  Cardiomyopathy- No increased DOE or activity intolerance. F/U echocardiogram showed slight  improvement in EF from 40-45% to 55-60%.  Details above.  Felt to be possibly related to ischemia.  Ischemic evaluation recommended, previously deferred due  to renal function.  Feels much improved since hospitalization.  Continues to increase his physical activity.  Given improvement in EF and lack of symptoms will defer cardiac catheterization for now. Continue torsemide, Lopressor, Imdur, hydralazine, amlodipine, metolazone Heart healthy low-sodium diet-salty 6 given Increase physical activity as tolerated Daily weights-contact office with a weight increase of 3 pounds overnight or 5 pounds in 1 week  Severe pulmonary hypertension- Snores, sleeping well.  Wishes to defer sleep study.   Echocardiogram showed PASP 60 mmHg Continue torsemide, metolazone Heart healthy low-sodium diet-salty 6 given Increase physical activity as tolerated  Essential hypertension-BP today 132/64 .  Well-controlled at home. Continue torsemide, Lopressor, hydralazine, amlodipine, Imdur Heart healthy low-sodium diet-salty 6 given Increase physical activity as tolerated   CKD-creatinine 3.80 on 01/07/2021, 2.63 on 09/10/2020.  Previously deferred cardiac catheterization due to worsening renal function. Follows with Dr. Hollie Salk  Anemia-hemoglobin 9.7 on 11/25/2020.  Showing upward trend. Follows with PCP  Disposition: Follow-up with Dr. Ellyn Hack  4-6 months.      [name] [name]. [name] Radloff NP-C    01/12/2021, 11:20 AM [address] Office [phone] Fax [phone]  Notice: This dictation was prepared with Dragon dictation along with smaller phrase technology. Any transcriptional errors that result from this process are unintentional and may not be corrected upon review.  I spent 15 minutes examining this patient, reviewing medications, and using patient centered shared decision making involving her cardiac care.  Prior to her visit I spent greater than 20 minutes  reviewing her past medical history,  medications, and prior cardiac tests.

## 2021-01-11 DIAGNOSIS — E113512 Type 2 diabetes mellitus with proliferative diabetic retinopathy with macular edema, left eye: Secondary | ICD-10-CM | POA: Diagnosis not present

## 2021-01-11 DIAGNOSIS — Z961 Presence of intraocular lens: Secondary | ICD-10-CM | POA: Diagnosis not present

## 2021-01-11 DIAGNOSIS — E113291 Type 2 diabetes mellitus with mild nonproliferative diabetic retinopathy without macular edema, right eye: Secondary | ICD-10-CM | POA: Diagnosis not present

## 2021-01-11 DIAGNOSIS — Z7984 Long term (current) use of oral hypoglycemic drugs: Secondary | ICD-10-CM | POA: Diagnosis not present

## 2021-01-12 ENCOUNTER — Encounter: Payer: Self-pay | Admitting: General Practice

## 2021-01-12 ENCOUNTER — Ambulatory Visit (INDEPENDENT_AMBULATORY_CARE_PROVIDER_SITE_OTHER): Payer: Medicare HMO | Admitting: General Practice

## 2021-01-12 ENCOUNTER — Telehealth: Payer: Self-pay | Admitting: *Deleted

## 2021-01-12 ENCOUNTER — Other Ambulatory Visit: Payer: Self-pay

## 2021-01-12 ENCOUNTER — Other Ambulatory Visit: Payer: Self-pay | Admitting: *Deleted

## 2021-01-12 VITALS — BP 132/64 | HR 54 | Ht 70.0 in | Wt 173.0 lb

## 2021-01-12 DIAGNOSIS — I272 Pulmonary hypertension, unspecified: Secondary | ICD-10-CM

## 2021-01-12 DIAGNOSIS — D638 Anemia in other chronic diseases classified elsewhere: Secondary | ICD-10-CM | POA: Diagnosis not present

## 2021-01-12 DIAGNOSIS — I10 Essential (primary) hypertension: Secondary | ICD-10-CM | POA: Diagnosis not present

## 2021-01-12 DIAGNOSIS — I429 Cardiomyopathy, unspecified: Secondary | ICD-10-CM

## 2021-01-12 NOTE — Telephone Encounter (Signed)
Called to give patient sleep study appointment. Left message to return a call. Patient's mother returned a call to me and states the patient does not want to do the sleep study. I told her that since they have appointment today here to see Coletta Memos he can discuss not having it with him when he is seen today. If after speaking to [place] and he still does not want to have it, then they can have the nurse cancel it.

## 2021-01-12 NOTE — Patient Instructions (Signed)
Medication Instructions:  The current medical regimen is effective;  continue present plan and medications as directed. Please refer to the Current Medication list given to you today.  *If you need a refill on your cardiac medications before your next appointment, please call your pharmacy*  Lab Work:   Testing/Procedures:  none    none  Special Instructions PLEASE READ AND FOLLOW SALTY 6-ATTACHED-1,'800mg'$  daily  PLEASE INCREASE PHYSICAL ACTIVITY AS TOLERATED,   Take and log your blood pressure at least twice weekly  Follow-Up: Your next appointment:  4-6 month(s) In Person with Glenetta Hew, MD ONLY At [facility], you and your health needs are our priority.  As part of our continuing mission to provide you with exceptional heart care, we have created designated Provider Care Teams.  These Care Teams include your primary Cardiologist (physician) and Advanced Practice Providers (APPs -  Physician Assistants and Nurse Practitioners) who all work together to provide you with the care you need, when you need it.  We recommend signing up for the patient portal called "MyChart".  Sign up information is provided on this After Visit Summary.  MyChart is used to connect with patients for Virtual Visits (Telemedicine).  Patients are able to view lab/test results, encounter notes, upcoming appointments, etc.  Non-urgent messages can be sent to your provider as well.   To learn more about what you can do with MyChart, go to [email or web address].              6 SALTY THINGS TO AVOID     1,'800MG'$  DAILY

## 2021-01-14 NOTE — Telephone Encounter (Signed)
Called to cancel sleep study-it was already

## 2021-01-20 ENCOUNTER — Encounter (HOSPITAL_COMMUNITY)
Admission: RE | Admit: 2021-01-20 | Discharge: 2021-01-20 | Disposition: A | Discharge status: Home / Self Care | Payer: Medicare HMO | Source: Ambulatory Visit | Attending: Nephrology | Admitting: Nephrology

## 2021-01-20 ENCOUNTER — Other Ambulatory Visit: Payer: Self-pay

## 2021-01-20 ENCOUNTER — Encounter (HOSPITAL_COMMUNITY): Payer: Self-pay

## 2021-01-20 DIAGNOSIS — D631 Anemia in chronic kidney disease: Secondary | ICD-10-CM | POA: Insufficient documentation

## 2021-01-20 DIAGNOSIS — N184 Chronic kidney disease, stage 4 (severe): Secondary | ICD-10-CM | POA: Diagnosis not present

## 2021-01-20 LAB — IRON AND TIBC
Iron: 62 ug/dL (ref 45–182)
Saturation Ratios: 19 % (ref 17.9–39.5)
TIBC: 333 ug/dL (ref 250–450)
UIBC: 271 ug/dL

## 2021-01-20 LAB — POCT HEMOGLOBIN-HEMACUE: Hemoglobin: 12.2 g/dL — ABNORMAL LOW (ref 13.0–17.0)

## 2021-01-20 LAB — FERRITIN: Ferritin: 91 ng/mL (ref 24–336)

## 2021-01-20 MED ORDER — EPOETIN ALFA-EPBX 10000 UNIT/ML IJ SOLN: 20000.0000 [IU] | Freq: Once | INTRAMUSCULAR | Status: DC

## 2021-01-20 NOTE — Progress Notes (Signed)
Results for HEBER, BRADFIELD (MRN [id number]) as of 01/20/2021 10:58    No injection today. Will recheck in 2 weeks as ordered.    Ref. Range 01/20/2021 11:05  Hemoglobin Latest Ref Range: 13.0 - 17.0 g/dL 12.2 (L)

## 2021-02-03 ENCOUNTER — Encounter (HOSPITAL_COMMUNITY)
Admission: RE | Admit: 2021-02-03 | Discharge: 2021-02-03 | Disposition: A | Discharge status: Home / Self Care | Payer: Medicare HMO | Source: Ambulatory Visit | Attending: Nephrology | Admitting: Nephrology

## 2021-02-03 ENCOUNTER — Other Ambulatory Visit: Payer: Self-pay

## 2021-02-03 DIAGNOSIS — N184 Chronic kidney disease, stage 4 (severe): Secondary | ICD-10-CM | POA: Diagnosis not present

## 2021-02-03 DIAGNOSIS — D631 Anemia in chronic kidney disease: Secondary | ICD-10-CM | POA: Diagnosis not present

## 2021-02-03 LAB — POCT HEMOGLOBIN-HEMACUE: Hemoglobin: 11.9 g/dL — ABNORMAL LOW (ref 13.0–17.0)

## 2021-02-03 LAB — FERRITIN: Ferritin: 86 ng/mL (ref 24–336)

## 2021-02-03 LAB — IRON AND TIBC
Iron: 73 ug/dL (ref 45–182)
Saturation Ratios: 21 % (ref 17.9–39.5)
TIBC: 355 ug/dL (ref 250–450)
UIBC: 282 ug/dL

## 2021-02-03 MED ORDER — EPOETIN ALFA-EPBX 10000 UNIT/ML IJ SOLN
INTRAMUSCULAR | Status: AC
  Filled 2021-02-03: qty 2

## 2021-02-03 MED ORDER — EPOETIN ALFA-EPBX 10000 UNIT/ML IJ SOLN
20000.0000 [IU] | Freq: Once | INTRAMUSCULAR | Status: DC
  Administered 2021-02-03: 20000 [IU] via SUBCUTANEOUS

## 2021-02-09 DIAGNOSIS — I429 Cardiomyopathy, unspecified: Secondary | ICD-10-CM | POA: Diagnosis not present

## 2021-02-09 DIAGNOSIS — N049 Nephrotic syndrome with unspecified morphologic changes: Secondary | ICD-10-CM | POA: Diagnosis not present

## 2021-02-09 DIAGNOSIS — I10 Essential (primary) hypertension: Secondary | ICD-10-CM | POA: Diagnosis not present

## 2021-02-09 DIAGNOSIS — E1169 Type 2 diabetes mellitus with other specified complication: Secondary | ICD-10-CM | POA: Diagnosis not present

## 2021-02-09 DIAGNOSIS — R609 Edema, unspecified: Secondary | ICD-10-CM | POA: Diagnosis not present

## 2021-02-25 ENCOUNTER — Encounter (HOSPITAL_BASED_OUTPATIENT_CLINIC_OR_DEPARTMENT_OTHER): Payer: Medicare HMO | Admitting: Cardiovascular Disease

## 2021-03-03 ENCOUNTER — Other Ambulatory Visit: Payer: Self-pay

## 2021-03-03 ENCOUNTER — Encounter (HOSPITAL_COMMUNITY)
Admission: RE | Admit: 2021-03-03 | Discharge: 2021-03-03 | Disposition: A | Discharge status: Home / Self Care | Payer: Medicare HMO | Source: Ambulatory Visit | Attending: Nephrology | Admitting: Nephrology

## 2021-03-03 ENCOUNTER — Encounter (HOSPITAL_COMMUNITY): Payer: Self-pay

## 2021-03-03 DIAGNOSIS — D631 Anemia in chronic kidney disease: Secondary | ICD-10-CM | POA: Insufficient documentation

## 2021-03-03 DIAGNOSIS — N184 Chronic kidney disease, stage 4 (severe): Secondary | ICD-10-CM | POA: Diagnosis not present

## 2021-03-03 LAB — POCT HEMOGLOBIN-HEMACUE: Hemoglobin: 11.4 g/dL — ABNORMAL LOW (ref 13.0–17.0)

## 2021-03-03 MED ORDER — EPOETIN ALFA-EPBX 10000 UNIT/ML IJ SOLN: 20000.0000 [IU] | Freq: Once | INTRAMUSCULAR | Status: DC

## 2021-03-03 MED ORDER — EPOETIN ALFA-EPBX 10000 UNIT/ML IJ SOLN
INTRAMUSCULAR | Status: AC
  Administered 2021-03-03: 20000 [IU]
  Filled 2021-03-03: qty 2

## 2021-03-10 DIAGNOSIS — I5042 Chronic combined systolic (congestive) and diastolic (congestive) heart failure: Secondary | ICD-10-CM | POA: Diagnosis not present

## 2021-03-10 DIAGNOSIS — D631 Anemia in chronic kidney disease: Secondary | ICD-10-CM | POA: Diagnosis not present

## 2021-03-10 DIAGNOSIS — N2581 Secondary hyperparathyroidism of renal origin: Secondary | ICD-10-CM | POA: Diagnosis not present

## 2021-03-10 DIAGNOSIS — N184 Chronic kidney disease, stage 4 (severe): Secondary | ICD-10-CM | POA: Diagnosis not present

## 2021-03-10 DIAGNOSIS — E1122 Type 2 diabetes mellitus with diabetic chronic kidney disease: Secondary | ICD-10-CM | POA: Diagnosis not present

## 2021-03-10 DIAGNOSIS — I129 Hypertensive chronic kidney disease with stage 1 through stage 4 chronic kidney disease, or unspecified chronic kidney disease: Secondary | ICD-10-CM | POA: Diagnosis not present

## 2021-03-11 DIAGNOSIS — E113291 Type 2 diabetes mellitus with mild nonproliferative diabetic retinopathy without macular edema, right eye: Secondary | ICD-10-CM | POA: Diagnosis not present

## 2021-03-11 DIAGNOSIS — E113211 Type 2 diabetes mellitus with mild nonproliferative diabetic retinopathy with macular edema, right eye: Secondary | ICD-10-CM | POA: Diagnosis not present

## 2021-03-11 DIAGNOSIS — E113512 Type 2 diabetes mellitus with proliferative diabetic retinopathy with macular edema, left eye: Secondary | ICD-10-CM | POA: Diagnosis not present

## 2021-03-11 DIAGNOSIS — Z961 Presence of intraocular lens: Secondary | ICD-10-CM | POA: Diagnosis not present

## 2021-03-11 DIAGNOSIS — Z7984 Long term (current) use of oral hypoglycemic drugs: Secondary | ICD-10-CM | POA: Diagnosis not present

## 2021-03-31 ENCOUNTER — Other Ambulatory Visit: Payer: Self-pay

## 2021-03-31 ENCOUNTER — Encounter (HOSPITAL_COMMUNITY)
Admission: RE | Admit: 2021-03-31 | Discharge: 2021-03-31 | Disposition: A | Discharge status: Home / Self Care | Payer: Medicare HMO | Source: Ambulatory Visit | Attending: Nephrology | Admitting: Nephrology

## 2021-03-31 DIAGNOSIS — N184 Chronic kidney disease, stage 4 (severe): Secondary | ICD-10-CM | POA: Diagnosis not present

## 2021-03-31 DIAGNOSIS — D631 Anemia in chronic kidney disease: Secondary | ICD-10-CM | POA: Insufficient documentation

## 2021-03-31 LAB — POCT HEMOGLOBIN-HEMACUE: Hemoglobin: 11.3 g/dL — ABNORMAL LOW (ref 13.0–17.0)

## 2021-03-31 LAB — IRON AND TIBC
Iron: 72 ug/dL (ref 45–182)
Saturation Ratios: 24 % (ref 17.9–39.5)
TIBC: 302 ug/dL (ref 250–450)
UIBC: 230 ug/dL

## 2021-03-31 LAB — FERRITIN: Ferritin: 137 ng/mL (ref 24–336)

## 2021-03-31 MED ORDER — EPOETIN ALFA-EPBX 10000 UNIT/ML IJ SOLN
INTRAMUSCULAR | Status: AC
  Filled 2021-03-31: qty 2

## 2021-03-31 MED ORDER — EPOETIN ALFA-EPBX 10000 UNIT/ML IJ SOLN
20000.0000 [IU] | Freq: Once | INTRAMUSCULAR | Status: DC
Start: 2021-03-31 — End: 2021-03-31
  Administered 2021-03-31: 20000 [IU] via SUBCUTANEOUS

## 2021-04-05 DIAGNOSIS — E039 Hypothyroidism, unspecified: Secondary | ICD-10-CM | POA: Diagnosis not present

## 2021-04-06 DIAGNOSIS — H52209 Unspecified astigmatism, unspecified eye: Secondary | ICD-10-CM | POA: Diagnosis not present

## 2021-04-06 DIAGNOSIS — H524 Presbyopia: Secondary | ICD-10-CM | POA: Diagnosis not present

## 2021-04-06 DIAGNOSIS — H5203 Hypermetropia, bilateral: Secondary | ICD-10-CM | POA: Diagnosis not present

## 2021-04-28 ENCOUNTER — Encounter (HOSPITAL_COMMUNITY)
Admission: RE | Admit: 2021-04-28 | Discharge: 2021-04-28 | Disposition: A | Discharge status: Home / Self Care | Payer: Medicare HMO | Source: Ambulatory Visit | Attending: Nephrology | Admitting: Nephrology

## 2021-04-28 DIAGNOSIS — D631 Anemia in chronic kidney disease: Secondary | ICD-10-CM | POA: Diagnosis not present

## 2021-04-28 DIAGNOSIS — N184 Chronic kidney disease, stage 4 (severe): Secondary | ICD-10-CM | POA: Diagnosis not present

## 2021-04-28 LAB — IRON AND TIBC
Iron: 89 ug/dL (ref 45–182)
Saturation Ratios: 27 % (ref 17.9–39.5)
TIBC: 330 ug/dL (ref 250–450)
UIBC: 241 ug/dL

## 2021-04-28 LAB — POCT HEMOGLOBIN-HEMACUE: Hemoglobin: 12.6 g/dL — ABNORMAL LOW (ref 13.0–17.0)

## 2021-04-28 LAB — FERRITIN: Ferritin: 126 ng/mL (ref 24–336)

## 2021-04-28 MED ORDER — EPOETIN ALFA-EPBX 10000 UNIT/ML IJ SOLN: 20000.0000 [IU] | Freq: Once | INTRAMUSCULAR | Status: DC

## 2021-05-26 ENCOUNTER — Encounter (HOSPITAL_COMMUNITY): Admission: RE | Admit: 2021-05-26 | Payer: Medicare HMO | Source: Ambulatory Visit

## 2021-06-01 ENCOUNTER — Encounter (HOSPITAL_COMMUNITY)
Admission: RE | Admit: 2021-06-01 | Discharge: 2021-06-01 | Disposition: A | Discharge status: Home / Self Care | Payer: Medicare HMO | Source: Ambulatory Visit | Attending: Nephrology | Admitting: Nephrology

## 2021-06-01 DIAGNOSIS — N184 Chronic kidney disease, stage 4 (severe): Secondary | ICD-10-CM | POA: Diagnosis not present

## 2021-06-01 DIAGNOSIS — D631 Anemia in chronic kidney disease: Secondary | ICD-10-CM | POA: Insufficient documentation

## 2021-06-01 LAB — POCT HEMOGLOBIN-HEMACUE: Hemoglobin: 11.9 g/dL — ABNORMAL LOW (ref 13.0–17.0)

## 2021-06-01 LAB — FERRITIN: Ferritin: 114 ng/mL (ref 24–336)

## 2021-06-01 LAB — IRON AND TIBC
Iron: 83 ug/dL (ref 45–182)
Saturation Ratios: 28 % (ref 17.9–39.5)
TIBC: 293 ug/dL (ref 250–450)
UIBC: 210 ug/dL

## 2021-06-01 MED ORDER — EPOETIN ALFA-EPBX 10000 UNIT/ML IJ SOLN
20000.0000 [IU] | Freq: Once | INTRAMUSCULAR | Status: AC
Start: 2021-06-01 — End: 2021-06-01

## 2021-06-01 MED ORDER — EPOETIN ALFA-EPBX 10000 UNIT/ML IJ SOLN
INTRAMUSCULAR | Status: AC
  Administered 2021-06-01: 15:00:00 20000 [IU] via SUBCUTANEOUS
  Filled 2021-06-01: qty 2

## 2021-06-10 DIAGNOSIS — E113291 Type 2 diabetes mellitus with mild nonproliferative diabetic retinopathy without macular edema, right eye: Secondary | ICD-10-CM | POA: Diagnosis not present

## 2021-06-10 DIAGNOSIS — Z7984 Long term (current) use of oral hypoglycemic drugs: Secondary | ICD-10-CM | POA: Diagnosis not present

## 2021-06-10 DIAGNOSIS — E113512 Type 2 diabetes mellitus with proliferative diabetic retinopathy with macular edema, left eye: Secondary | ICD-10-CM | POA: Diagnosis not present

## 2021-06-10 DIAGNOSIS — E113211 Type 2 diabetes mellitus with mild nonproliferative diabetic retinopathy with macular edema, right eye: Secondary | ICD-10-CM | POA: Diagnosis not present

## 2021-06-10 DIAGNOSIS — Z961 Presence of intraocular lens: Secondary | ICD-10-CM | POA: Diagnosis not present

## 2021-06-15 DIAGNOSIS — E1122 Type 2 diabetes mellitus with diabetic chronic kidney disease: Secondary | ICD-10-CM | POA: Diagnosis not present

## 2021-06-15 DIAGNOSIS — I120 Hypertensive chronic kidney disease with stage 5 chronic kidney disease or end stage renal disease: Secondary | ICD-10-CM | POA: Diagnosis not present

## 2021-06-15 DIAGNOSIS — N184 Chronic kidney disease, stage 4 (severe): Secondary | ICD-10-CM | POA: Diagnosis not present

## 2021-06-15 DIAGNOSIS — N2581 Secondary hyperparathyroidism of renal origin: Secondary | ICD-10-CM | POA: Diagnosis not present

## 2021-06-15 DIAGNOSIS — D631 Anemia in chronic kidney disease: Secondary | ICD-10-CM | POA: Diagnosis not present

## 2021-06-15 DIAGNOSIS — N185 Chronic kidney disease, stage 5: Secondary | ICD-10-CM | POA: Diagnosis not present

## 2021-06-17 DIAGNOSIS — E1169 Type 2 diabetes mellitus with other specified complication: Secondary | ICD-10-CM | POA: Diagnosis not present

## 2021-06-17 DIAGNOSIS — I429 Cardiomyopathy, unspecified: Secondary | ICD-10-CM | POA: Diagnosis not present

## 2021-06-17 DIAGNOSIS — I10 Essential (primary) hypertension: Secondary | ICD-10-CM | POA: Diagnosis not present

## 2021-06-17 DIAGNOSIS — E039 Hypothyroidism, unspecified: Secondary | ICD-10-CM | POA: Diagnosis not present

## 2021-06-17 DIAGNOSIS — E785 Hyperlipidemia, unspecified: Secondary | ICD-10-CM | POA: Diagnosis not present

## 2021-06-17 DIAGNOSIS — Z7984 Long term (current) use of oral hypoglycemic drugs: Secondary | ICD-10-CM | POA: Diagnosis not present

## 2021-06-29 ENCOUNTER — Encounter (HOSPITAL_COMMUNITY)
Admission: RE | Admit: 2021-06-29 | Discharge: 2021-06-29 | Disposition: A | Discharge status: Home / Self Care | Payer: Medicare HMO | Source: Ambulatory Visit | Attending: Nephrology | Admitting: Nephrology

## 2021-06-29 ENCOUNTER — Encounter (HOSPITAL_COMMUNITY): Payer: Self-pay

## 2021-06-29 DIAGNOSIS — N184 Chronic kidney disease, stage 4 (severe): Secondary | ICD-10-CM | POA: Insufficient documentation

## 2021-06-29 DIAGNOSIS — D631 Anemia in chronic kidney disease: Secondary | ICD-10-CM | POA: Insufficient documentation

## 2021-06-29 LAB — IRON AND TIBC
Iron: 81 ug/dL (ref 45–182)
Saturation Ratios: 25 % (ref 17.9–39.5)
TIBC: 326 ug/dL (ref 250–450)
UIBC: 245 ug/dL

## 2021-06-29 LAB — POCT HEMOGLOBIN-HEMACUE: Hemoglobin: 11.5 g/dL — ABNORMAL LOW (ref 13.0–17.0)

## 2021-06-29 LAB — FERRITIN: Ferritin: 117 ng/mL (ref 24–336)

## 2021-06-29 MED ORDER — EPOETIN ALFA-EPBX 10000 UNIT/ML IJ SOLN
20000.0000 [IU] | Freq: Once | INTRAMUSCULAR | Status: AC
  Administered 2021-06-29: 20000 [IU] via SUBCUTANEOUS
  Filled 2021-06-29: qty 2

## 2021-07-08 DIAGNOSIS — N184 Chronic kidney disease, stage 4 (severe): Secondary | ICD-10-CM | POA: Diagnosis not present

## 2021-07-08 DIAGNOSIS — E785 Hyperlipidemia, unspecified: Secondary | ICD-10-CM | POA: Diagnosis not present

## 2021-07-08 DIAGNOSIS — I10 Essential (primary) hypertension: Secondary | ICD-10-CM | POA: Diagnosis not present

## 2021-07-08 DIAGNOSIS — E039 Hypothyroidism, unspecified: Secondary | ICD-10-CM | POA: Diagnosis not present

## 2021-07-08 DIAGNOSIS — N049 Nephrotic syndrome with unspecified morphologic changes: Secondary | ICD-10-CM | POA: Diagnosis not present

## 2021-07-08 DIAGNOSIS — E1169 Type 2 diabetes mellitus with other specified complication: Secondary | ICD-10-CM | POA: Diagnosis not present

## 2021-07-14 DIAGNOSIS — N189 Chronic kidney disease, unspecified: Secondary | ICD-10-CM | POA: Diagnosis not present

## 2021-07-14 DIAGNOSIS — N2581 Secondary hyperparathyroidism of renal origin: Secondary | ICD-10-CM | POA: Diagnosis not present

## 2021-07-14 DIAGNOSIS — E1122 Type 2 diabetes mellitus with diabetic chronic kidney disease: Secondary | ICD-10-CM | POA: Diagnosis not present

## 2021-07-14 DIAGNOSIS — D631 Anemia in chronic kidney disease: Secondary | ICD-10-CM | POA: Diagnosis not present

## 2021-07-14 DIAGNOSIS — N185 Chronic kidney disease, stage 5: Secondary | ICD-10-CM | POA: Diagnosis not present

## 2021-07-14 DIAGNOSIS — I5042 Chronic combined systolic (congestive) and diastolic (congestive) heart failure: Secondary | ICD-10-CM | POA: Diagnosis not present

## 2021-07-14 DIAGNOSIS — I120 Hypertensive chronic kidney disease with stage 5 chronic kidney disease or end stage renal disease: Secondary | ICD-10-CM | POA: Diagnosis not present

## 2021-07-19 ENCOUNTER — Other Ambulatory Visit: Payer: Self-pay

## 2021-07-19 ENCOUNTER — Ambulatory Visit: Payer: Medicare HMO | Admitting: Cardiology

## 2021-07-19 ENCOUNTER — Encounter: Payer: Self-pay | Admitting: Cardiology

## 2021-07-19 VITALS — BP 142/70 | HR 51 | Ht 70.0 in | Wt 188.0 lb

## 2021-07-19 DIAGNOSIS — I429 Cardiomyopathy, unspecified: Secondary | ICD-10-CM

## 2021-07-19 DIAGNOSIS — I272 Pulmonary hypertension, unspecified: Secondary | ICD-10-CM | POA: Diagnosis not present

## 2021-07-19 DIAGNOSIS — I10 Essential (primary) hypertension: Secondary | ICD-10-CM

## 2021-07-19 DIAGNOSIS — E785 Hyperlipidemia, unspecified: Secondary | ICD-10-CM | POA: Diagnosis not present

## 2021-07-19 DIAGNOSIS — I420 Dilated cardiomyopathy: Secondary | ICD-10-CM

## 2021-07-19 DIAGNOSIS — E1169 Type 2 diabetes mellitus with other specified complication: Secondary | ICD-10-CM

## 2021-07-19 DIAGNOSIS — N183 Chronic kidney disease, stage 3 unspecified: Secondary | ICD-10-CM

## 2021-07-19 NOTE — Progress Notes (Signed)
Primary Care Provider: [name], MD Cardiologist: Glenetta Hew, MD Electrophysiologist: None Nephrologist: Dr. Hollie Salk  Clinic Note: No chief complaint on file.  ===================================  ASSESSMENT/PLAN   Problem List Items Addressed This Visit       Cardiology Problems   Dilated cardiomyopathy [facility]) - Primary    Essentially resolved now with EF back into the normal range. EF 50 to 55% with no wall motion abnormalities.  Agree with holding off on ischemic evaluation.  If he were ever to need pretransplant work-up, at that point we could consider cardiac catheterization other (otherwise but be reluctant to put him at risk for contrast nephropathy).  Most likely etiology was related to volume overload from nephrotic syndrome as this seemed to improve with his resolution of nephrotic syndrome symptoms and volume overload.  Now that his EF is better, okay to be on Lopressor along with amlodipine and hydralazine/nitrate for afterload reduction.      Relevant Orders   EKG 12-Lead (Completed)   Pulmonary hypertension, unspecified ([place])    Significant elevated pulmonary pressures during her initial evaluation in March improved over 3 months and then by July 2022 was back into normal range.  I suspect this is all volume overload related.  Still has some grade 2 diastolic function and therefore may have some pulmonary venous congestion.  If cardiac catheterization was to be considered would do right and left, but now that he is EF is improved, will hold off.      Relevant Orders   EKG 12-Lead (Completed)   Hyperlipidemia associated with type 2 diabetes mellitus ([place]) (Chronic)    Lipids followed by PCP.  On atorvastatin.  I do not see recent labs however.  As of November 2021, LDL was 102 which is relatively well controlled but triglycerides elevated.  Does not not appear to be on any thing besides glipizide for diabetes.  Defer to PCP.      Essential hypertension  (Chronic)    Blood pressure is borderline elevated today.  Will defer to nephrology.  I suspect that we can probably titrate hydralazine up to 50 mg every 8 hours along with Imdur.  Currently now taking a total of 50 mg twice daily Lopressor along with 10 mg amlodipine.  We will defer BP management to nephrology at this point since I am not seeing him frequently.      Relevant Orders   EKG 12-Lead (Completed)     Other   Stage 3 chronic kidney disease ([place]) (Chronic)    Followed by Dr. Hollie Salk from nephrology.  Labs were recently checked.  I do not have results.      ===================================  HPI:    [name] [name] is a 61 y.o. male with a PMH notable for NEPHROTIC SYNDROME ([place]), CARDIOMYOPATHY, HYPERTENSION, PULMONARY HYPERTENSION, MODERATE MR, DM-2, and CAD who presents today for 26-month follow-up.  I have not seen him since March 2022 as an inpatient consultation for reduced EF in the setting of nephrotic syndrome proteinuria with pulmonary edema and anasarca/AKI.  Echo showed EF of 40 to 45% with GRII DD.  Moderate RV dysfunction severely dilated PA suggestive of elevated PA pressures. -=> Diagnosed with nodular diabetic glomerulosclerosis => aggressive diuresis at least 10 pounds.  In the absence of any chest pain, palpitations, syncope or antecedent PND, orthopnea, the plan was to optimize medical management and reassess echocardiogram after optimization.  No ACE inhibitor/ARB was added because of renal insufficiency.  He was placed on hydralazine  nitrate plus amlodipine and Toprol. => Deferred consideration of ischemic evaluation of right and left heart cath because of renal insufficiency.  [name] [name] was last seen on January 12, 2021 by Coletta Memos, NP for second follow-up (after second follow-up echocardiogram as well).  Initial visit was in June.  Symptoms were improving.  Starting to move from wheelchair to walker then cane.  Strength  returning.  No chest pain and dyspnea improving.  Also being followed by nephrology.  Blood pressure is notably improved.  Low-sodium diet.  Wound care for open ulcers.  Initial follow-up echocardiogram was not that much improved from March, however 1 month later, EF had improved as had pulmonary pressures (see below) => August visit showed him being more active, doing physical therapy.  Walking with a cane only for longer distances.  No significant exertional dyspnea.  No chest pain or pressure.  With improved EF, decided against cardiac catheterization.  Follow-up echo also showed improved PA pressures.  As such, sleep study deferred as well.  Recent Hospitalizations: none  Reviewed  CV studies:    The following studies were reviewed today: (if available, images/films reviewed: From Epic Chart or Care Everywhere) TTE 08/29/2020: EF 40 to 45%.  Global HK.  GR 2 DD.  PAP estimated 86 mmHg.  Moderate LA dilation.  Mild RA dilation.  Large pleural effusion on the left.  Moderate MR.  Moderate TR.  AV sclerosis without stenosis.  RAP estimated 15 mmHg. TTE 11/20/2020: EF 45- 50%.  Mildly reduced function.  Global HK.  GR 3 DD (restrictive).  Severe PAP elevation estimated 60 mmHg.  Mild LA and RA dilation.  Mild to moderate MR.  AOV sclerosis without stenosis.  Moderately elevated RAP. TTE 12/25/2020: EF 55 to 60%.  No R WMA.  GR 2 DD.  Mild to moderate LA dilation.  Mild RA dilation.  Mild MR.  AOV sclerosis without stenosis.   Interval History:   [name] [name] presents here today for follow-up as a first visit to see me since discharge.  He is here with his mother who helps provide history.  He himself is a relatively poor historian.  Does not have good understanding of his health conditions.  He is notably less dyspneic.  Energy level is better.  Blood counts have improved which has contributed to more energy.  He recently had labs checked by nephrology which are not available.  His swelling is notably  improved wearing support stockings and on pretty high doses of diuretic.  No PND, or orthopnea.  No chest pain or pressure with rest or exertion.  He is deconditioned so may get little bit dyspneic on exertion, but nothing significant.  No tachycardia symptoms.  CV Review of Symptoms (Summary) Cardiovascular ROS: positive for - dyspnea on exertion, edema, shortness of breath, and all of this has improved.  Also mildly with exercise intolerance which is also improving.  Notable improvement from August. negative for - chest pain, irregular heartbeat, orthopnea, palpitations, paroxysmal nocturnal dyspnea, rapid heart rate, or lightheadedness, dizziness wooziness, syncope/near syncope or TIA/amaurosis fugax, claudication.  REVIEWED OF SYSTEMS   Review of Systems  Constitutional:  Positive for malaise/fatigue (Energy level has improved with treatment of anemia, but as CBC improves, energy has improved). Negative for weight loss (He actually has put back on weight with becoming healthier.  After recovering from his nephrotic syndrome.  He initially lost a lot of fluid weight, but now seems to be putting back on normal weight.).  HENT:  Negative for congestion.   Respiratory:  Positive for shortness of breath (Only with notable exertion). Negative for cough.   Cardiovascular:  Positive for leg swelling (Improved/stable).  Gastrointestinal:  Negative for blood in stool and melena.  Genitourinary:  Negative for dysuria and hematuria.  Musculoskeletal:  Positive for back pain and joint pain.  Neurological:  Negative for dizziness and weakness.  Psychiatric/Behavioral:  Positive for memory loss. The patient is not nervous/anxious and does not have insomnia.        Questionable slow mentation   I have reviewed and (if needed) personally updated the patient's problem list, medications, allergies, past medical and surgical history, social and family history.   PAST MEDICAL HISTORY   Past Medical History:   Diagnosis Date   Arthritis    Diabetes mellitus (HCC)    GERD (gastroesophageal reflux disease)    HTN (hypertension)    Hyperlipidemia    Nephrotic syndrome    Obesity     PAST SURGICAL HISTORY   Past Surgical History:  Procedure Laterality Date   CATARACT EXTRACTION, BILATERAL  2018   TONSILLECTOMY       There is no immunization history on file for this patient.  MEDICATIONS/ALLERGIES   Current Meds  Medication Sig   Accu-Chek Softclix Lancets lancets    acetaminophen (TYLENOL) 500 MG tablet Take 500 mg by mouth every 6 (six) hours as needed (arthritis).    amLODipine (NORVASC) 10 MG tablet Take 1 tablet (10 mg total) by mouth daily.   atorvastatin (LIPITOR) 10 MG tablet Take 10 mg by mouth daily.   benzonatate (TESSALON) 100 MG capsule Take 100 mg by mouth 3 (three) times daily as needed for cough.   Blood Glucose Monitoring Suppl (ACCU-CHEK GUIDE) w/Device KIT in the morning.   Calcium Carbonate-Simethicone 750-80 MG CHEW Chew 1 tablet by mouth daily as needed (heartburn).   famotidine (PEPCID) 20 MG tablet Take 1 tablet (20 mg total) by mouth 2 (two) times daily as needed for heartburn or indigestion.   gabapentin (NEURONTIN) 100 MG capsule Take 100 mg by mouth 2 (two) times daily.   glipiZIDE (GLUCOTROL) 5 MG tablet Take 5 mg by mouth daily.   hydrALAZINE (APRESOLINE) 25 MG tablet Take 1 tablet (25 mg total) by mouth every 8 (eight) hours. (Patient taking differently: Take 50 mg by mouth 2 (two) times daily.)   isosorbide mononitrate (IMDUR) 30 MG 24 hr tablet Take 1 tablet (30 mg total) by mouth daily.   loratadine (CLARITIN) 10 MG tablet Take 10 mg by mouth daily.   metolazone (ZAROXOLYN) 2.5 MG tablet Take 1 tablet (2.5 mg total) by mouth every other day.   metoprolol tartrate (LOPRESSOR) 50 MG tablet Take 1 tablet (50 mg total) by mouth 2 (two) times daily.   torsemide (DEMADEX) 100 MG tablet Take 1 tablet (100 mg total) by mouth daily.   vitamin B-12  (CYANOCOBALAMIN) 1000 MCG tablet Take 1,000 mcg by mouth daily.    No Known Allergies  SOCIAL HISTORY/FAMILY HISTORY   Reviewed in Epic:  Pertinent findings:  Social History   Tobacco Use   Smoking status: Never   Smokeless tobacco: Never  Vaping Use   Vaping Use: Never used  Substance Use Topics   Alcohol use: Never   Drug use: Never   Social History   Social History Narrative   Not on file    OBJCTIVE -PE, EKG, labs   Wt Readings from Last 3 Encounters:  07/19/21 188 lb (85.3 kg)  06/29/21 173 lb 1 oz (78.5 kg)  03/03/21 173 lb 1 oz (78.5 kg)    Physical Exam: BP (!) 142/70    Pulse (!) 51    Ht $R'5\' 10"'xK$  (1.778 m)    Wt 188 lb (85.3 kg)    SpO2 98%    BMI 26.98 kg/m  Physical Exam Vitals reviewed.  Constitutional:      General: He is not in acute distress.    Appearance: He is normal weight. He is ill-appearing (Chronically ill-appearing). He is not toxic-appearing.     Comments: Healthy-appearing.  Mildly disheveled, but nontoxic.  Notably improved from when in-patient  HENT:     Head: Normocephalic and atraumatic.  Neck:     Vascular: Normal carotid pulses. No carotid bruit or JVD.  Cardiovascular:     Rate and Rhythm: Regular rhythm. Bradycardia present. No extrasystoles are present.    Chest Wall: PMI is not displaced.     Pulses: Decreased pulses (Decreased but palpable pedal pulses).     Heart sounds: S1 normal and S2 normal. Heart sounds are distant. Murmur heard.  Harsh crescendo-decrescendo early systolic murmur is present with a grade of 1/6 at the upper right sternal border radiating to the neck.    No friction rub. No gallop. No S4 sounds.  Pulmonary:     Effort: Pulmonary effort is normal. No respiratory distress.     Breath sounds: Normal breath sounds. No wheezing, rhonchi or rales.  Chest:     Chest wall: No tenderness.  Musculoskeletal:        General: Swelling (Trivial ankle) present.     Cervical back: Normal range of motion and neck  supple.  Skin:    General: Skin is warm and dry.     Coloration: Skin is pale.  Neurological:     General: No focal deficit present.     Mental Status: He is alert and oriented to person, place, and time.  Psychiatric:        Mood and Affect: Mood normal.     Comments: Slow mentation.  Poor historian.  Flat affect.  Does not answer questions very much.  Defers to mother.     Adult ECG Report  Rate: 51;  Rhythm: normal sinus rhythm and mild LVH voltage.  Otherwise normal axis, intervals and durations. ;   Narrative Interpretation: Stable  Recent Labs: Labs checked by PCP PE but also by nephrology.  Not currently available.  Lab Results  Component Value Date   CHOL 194 04/27/2020   HDL 46 04/27/2020   LDLCALC 102 (H) 04/27/2020   TRIG 228 (H) 04/27/2020   CHOLHDL 4.2 04/27/2020   Lab Results  Component Value Date   CREATININE 2.63 (H) 09/10/2020   BUN 43 (H) 09/10/2020   NA 129 (L) 09/10/2020   K 3.7 09/10/2020   CL 92 (L) 09/10/2020   CO2 30 09/10/2020   CBC Latest Ref Rng & Units 06/29/2021 06/01/2021 04/28/2021  WBC 4.0 - 10.5 K/uL - - -  Hemoglobin 13.0 - 17.0 g/dL 11.5(L) 11.9(L) 12.6(L)  Hematocrit 39.0 - 52.0 % - - -  Platelets 150 - 400 K/uL - - -    Lab Results  Component Value Date   HGBA1C 5.3 08/28/2020   No results found for: TSH  ==================================================  COVID-19 Education: The signs and symptoms of COVID-19 were discussed with the patient and how to seek care for testing (follow up with PCP or arrange E-visit).    I spent  a total of 19 minutes with the patient spent in direct patient consultation.  Additional time spent with chart review  / charting (studies, outside notes, etc): 23 min Total Time: 42 min  Current medicines are reviewed at length with the patient today.  (+/- concerns) N/A  This visit occurred during the SARS-CoV-2 public health emergency.  Safety protocols were in place, including screening questions  prior to the visit, additional usage of staff PPE, and extensive cleaning of exam room while observing appropriate contact time as indicated for disinfecting solutions.  Notice: This dictation was prepared with Dragon dictation along with smart phrase technology. Any transcriptional errors that result from this process are unintentional and may not be corrected upon review.  Studies Ordered:   Orders Placed This Encounter  Procedures   EKG 12-Lead    Patient Instructions / Medication Changes & Studies & Tests Ordered   Patient Instructions  Medication Instructions:  NO CHANGES *If you need a refill on your cardiac medications before your next appointment, please call your pharmacy*   Lab Work: NOT NEEDED If you have labs (blood work) drawn today and your tests are completely normal, you will receive your results only by: MyChart Message (if you have MyChart) OR A paper copy in the mail If you have any lab test that is abnormal or we need to change your treatment, we will call you to review the results.   Testing/Procedures: NOT NEEDED   Follow-Up: At [facility], you and your health needs are our priority.  As part of our continuing mission to provide you with exceptional heart care, we have created designated Provider Care Teams.  These Care Teams include your primary Cardiologist (physician) and Advanced Practice Providers (APPs -  Physician Assistants and Nurse Practitioners) who all work together to provide you with the care you need, when you need it.  We recommend signing up for the patient portal called "MyChart".  Sign up information is provided on this After Visit Summary.  MyChart is used to connect with patients for Virtual Visits (Telemedicine).  Patients are able to view lab/test results, encounter notes, upcoming appointments, etc.  Non-urgent messages can be sent to your provider as well.   To learn more about what you can do with MyChart, go to  [email or web address].    Your next appointment:   6 month(s)  The format for your next appointment:   In Person  Provider:   Coletta Memos, FNP    Then, Glenetta Hew, MD will plan to see you again in 12 month(s).       Glenetta Hew, M.D., M.S. Interventional Cardiologist   Pager # [phone] Phone # [phone] [address] [place], [place] [address]   Thank you for choosing [facility] at [facility]!!

## 2021-07-19 NOTE — Patient Instructions (Addendum)
Medication Instructions:  NO CHANGES *If you need a refill on your cardiac medications before your next appointment, please call your pharmacy*   Lab Work: NOT NEEDED If you have labs (blood work) drawn today and your tests are completely normal, you will receive your results only by: [place] (if you have MyChart) OR A paper copy in the mail If you have any lab test that is abnormal or we need to change your treatment, we will call you to review the results.   Testing/Procedures: NOT NEEDED   Follow-Up: At [facility], you and your health needs are our priority.  As part of our continuing mission to provide you with exceptional heart care, we have created designated Provider Care Teams.  These Care Teams include your primary Cardiologist (physician) and Advanced Practice Providers (APPs -  Physician Assistants and Nurse Practitioners) who all work together to provide you with the care you need, when you need it.  We recommend signing up for the patient portal called "MyChart".  Sign up information is provided on this After Visit Summary.  MyChart is used to connect with patients for Virtual Visits (Telemedicine).  Patients are able to view lab/test results, encounter notes, upcoming appointments, etc.  Non-urgent messages can be sent to your provider as well.   To learn more about what you can do with MyChart, go to [email or web address].    Your next appointment:   6 month(s)  The format for your next appointment:   In Person  Provider:   Coletta Memos, FNP    Then, Glenetta Hew, MD will plan to see you again in 12 month(s).

## 2021-07-20 DIAGNOSIS — I272 Pulmonary hypertension, unspecified: Secondary | ICD-10-CM | POA: Insufficient documentation

## 2021-07-20 NOTE — Assessment & Plan Note (Signed)
Blood pressure is borderline elevated today.  Will defer to nephrology.  I suspect that we can probably titrate hydralazine up to 50 mg every 8 hours along with Imdur.  Currently now taking a total of 50 mg twice daily Lopressor along with 10 mg amlodipine.  We will defer BP management to nephrology at this point since I am not seeing him frequently.

## 2021-07-20 NOTE — Assessment & Plan Note (Signed)
Significant elevated pulmonary pressures during her initial evaluation in March improved over 3 months and then by July 2022 was back into normal range.  I suspect this is all volume overload related.  Still has some grade 2 diastolic function and therefore may have some pulmonary venous congestion.  If cardiac catheterization was to be considered would do right and left, but now that he is EF is improved, will hold off.

## 2021-07-20 NOTE — Assessment & Plan Note (Signed)
Followed by Dr. Hollie Salk from nephrology.  Labs were recently checked.  I do not have results.

## 2021-07-20 NOTE — Assessment & Plan Note (Signed)
Essentially resolved now with EF back into the normal range. EF 50 to 55% with no wall motion abnormalities.  Agree with holding off on ischemic evaluation.  If he were ever to need pretransplant work-up, at that point we could consider cardiac catheterization other (otherwise but be reluctant to put him at risk for contrast nephropathy).  Most likely etiology was related to volume overload from nephrotic syndrome as this seemed to improve with his resolution of nephrotic syndrome symptoms and volume overload.  Now that his EF is better, okay to be on Lopressor along with amlodipine and hydralazine/nitrate for afterload reduction.

## 2021-07-20 NOTE — Assessment & Plan Note (Addendum)
Lipids followed by PCP.  On atorvastatin.  I do not see recent labs however.  As of November 2021, LDL was 102 which is relatively well controlled but triglycerides elevated.  Does not not appear to be on any thing besides glipizide for diabetes.  Defer to PCP.

## 2021-07-27 ENCOUNTER — Encounter (HOSPITAL_COMMUNITY): Admission: RE | Admit: 2021-07-27 | Payer: Medicare HMO | Source: Ambulatory Visit

## 2021-08-03 ENCOUNTER — Encounter (HOSPITAL_COMMUNITY)
Admission: RE | Admit: 2021-08-03 | Discharge: 2021-08-03 | Disposition: A | Discharge status: Home / Self Care | Payer: Medicare HMO | Source: Ambulatory Visit | Attending: Nephrology | Admitting: Nephrology

## 2021-08-03 ENCOUNTER — Encounter (HOSPITAL_COMMUNITY): Payer: Self-pay

## 2021-08-03 DIAGNOSIS — N184 Chronic kidney disease, stage 4 (severe): Secondary | ICD-10-CM | POA: Insufficient documentation

## 2021-08-03 DIAGNOSIS — D631 Anemia in chronic kidney disease: Secondary | ICD-10-CM | POA: Diagnosis not present

## 2021-08-03 LAB — IRON AND TIBC
Iron: 67 ug/dL (ref 45–182)
Saturation Ratios: 24 % (ref 17.9–39.5)
TIBC: 280 ug/dL (ref 250–450)
UIBC: 213 ug/dL

## 2021-08-03 LAB — POCT HEMOGLOBIN-HEMACUE: Hemoglobin: 10.5 g/dL — ABNORMAL LOW (ref 13.0–17.0)

## 2021-08-03 LAB — FERRITIN: Ferritin: 134 ng/mL (ref 24–336)

## 2021-08-03 MED ORDER — EPOETIN ALFA-EPBX 10000 UNIT/ML IJ SOLN
20000.0000 [IU] | Freq: Once | INTRAMUSCULAR | Status: AC
  Administered 2021-08-03: 20000 [IU] via SUBCUTANEOUS
  Filled 2021-08-03: qty 2

## 2021-08-06 DIAGNOSIS — E1169 Type 2 diabetes mellitus with other specified complication: Secondary | ICD-10-CM | POA: Diagnosis not present

## 2021-08-06 DIAGNOSIS — E039 Hypothyroidism, unspecified: Secondary | ICD-10-CM | POA: Diagnosis not present

## 2021-08-06 DIAGNOSIS — I10 Essential (primary) hypertension: Secondary | ICD-10-CM | POA: Diagnosis not present

## 2021-08-06 DIAGNOSIS — N184 Chronic kidney disease, stage 4 (severe): Secondary | ICD-10-CM | POA: Diagnosis not present

## 2021-08-06 DIAGNOSIS — E785 Hyperlipidemia, unspecified: Secondary | ICD-10-CM | POA: Diagnosis not present

## 2021-08-13 DIAGNOSIS — I10 Essential (primary) hypertension: Secondary | ICD-10-CM | POA: Diagnosis not present

## 2021-08-13 DIAGNOSIS — E039 Hypothyroidism, unspecified: Secondary | ICD-10-CM | POA: Diagnosis not present

## 2021-08-13 DIAGNOSIS — E1169 Type 2 diabetes mellitus with other specified complication: Secondary | ICD-10-CM | POA: Diagnosis not present

## 2021-09-09 DIAGNOSIS — Z961 Presence of intraocular lens: Secondary | ICD-10-CM | POA: Diagnosis not present

## 2021-09-09 DIAGNOSIS — H43813 Vitreous degeneration, bilateral: Secondary | ICD-10-CM | POA: Diagnosis not present

## 2021-09-09 DIAGNOSIS — Z7984 Long term (current) use of oral hypoglycemic drugs: Secondary | ICD-10-CM | POA: Diagnosis not present

## 2021-09-09 DIAGNOSIS — H47291 Other optic atrophy, right eye: Secondary | ICD-10-CM | POA: Diagnosis not present

## 2021-09-09 DIAGNOSIS — E113512 Type 2 diabetes mellitus with proliferative diabetic retinopathy with macular edema, left eye: Secondary | ICD-10-CM | POA: Diagnosis not present

## 2021-09-09 DIAGNOSIS — H35013 Changes in retinal vascular appearance, bilateral: Secondary | ICD-10-CM | POA: Diagnosis not present

## 2021-09-09 DIAGNOSIS — H35361 Drusen (degenerative) of macula, right eye: Secondary | ICD-10-CM | POA: Diagnosis not present

## 2021-09-09 DIAGNOSIS — E113291 Type 2 diabetes mellitus with mild nonproliferative diabetic retinopathy without macular edema, right eye: Secondary | ICD-10-CM | POA: Diagnosis not present

## 2021-09-09 DIAGNOSIS — E113211 Type 2 diabetes mellitus with mild nonproliferative diabetic retinopathy with macular edema, right eye: Secondary | ICD-10-CM | POA: Diagnosis not present

## 2021-09-09 DIAGNOSIS — H5704 Mydriasis: Secondary | ICD-10-CM | POA: Diagnosis not present

## 2021-09-09 DIAGNOSIS — H11153 Pinguecula, bilateral: Secondary | ICD-10-CM | POA: Diagnosis not present

## 2021-09-09 DIAGNOSIS — H57813 Brow ptosis, bilateral: Secondary | ICD-10-CM | POA: Diagnosis not present

## 2021-09-14 ENCOUNTER — Encounter (HOSPITAL_COMMUNITY): Payer: Medicare HMO

## 2021-09-21 ENCOUNTER — Encounter (HOSPITAL_COMMUNITY)
Admission: RE | Admit: 2021-09-21 | Discharge: 2021-09-21 | Disposition: A | Discharge status: Home / Self Care | Payer: Medicare HMO | Source: Ambulatory Visit | Attending: Nephrology | Admitting: Nephrology

## 2021-09-29 DIAGNOSIS — D631 Anemia in chronic kidney disease: Secondary | ICD-10-CM | POA: Diagnosis not present

## 2021-09-29 DIAGNOSIS — I129 Hypertensive chronic kidney disease with stage 1 through stage 4 chronic kidney disease, or unspecified chronic kidney disease: Secondary | ICD-10-CM | POA: Diagnosis not present

## 2021-09-29 DIAGNOSIS — E785 Hyperlipidemia, unspecified: Secondary | ICD-10-CM | POA: Diagnosis not present

## 2021-09-29 DIAGNOSIS — E1122 Type 2 diabetes mellitus with diabetic chronic kidney disease: Secondary | ICD-10-CM | POA: Diagnosis not present

## 2021-09-29 DIAGNOSIS — N184 Chronic kidney disease, stage 4 (severe): Secondary | ICD-10-CM | POA: Diagnosis not present

## 2021-09-29 DIAGNOSIS — I5042 Chronic combined systolic (congestive) and diastolic (congestive) heart failure: Secondary | ICD-10-CM | POA: Diagnosis not present

## 2021-09-29 DIAGNOSIS — N2581 Secondary hyperparathyroidism of renal origin: Secondary | ICD-10-CM | POA: Diagnosis not present

## 2021-10-05 ENCOUNTER — Encounter (HOSPITAL_COMMUNITY): Payer: Self-pay

## 2021-10-05 ENCOUNTER — Encounter (HOSPITAL_COMMUNITY)
Admission: RE | Admit: 2021-10-05 | Discharge: 2021-10-05 | Disposition: A | Discharge status: Home / Self Care | Payer: Medicare HMO | Source: Ambulatory Visit | Attending: Nephrology | Admitting: Nephrology

## 2021-10-05 DIAGNOSIS — D631 Anemia in chronic kidney disease: Secondary | ICD-10-CM | POA: Diagnosis not present

## 2021-10-05 DIAGNOSIS — N184 Chronic kidney disease, stage 4 (severe): Secondary | ICD-10-CM | POA: Insufficient documentation

## 2021-10-05 LAB — IRON AND TIBC
Iron: 62 ug/dL (ref 45–182)
Saturation Ratios: 20 % (ref 17.9–39.5)
TIBC: 305 ug/dL (ref 250–450)
UIBC: 243 ug/dL

## 2021-10-05 LAB — POCT HEMOGLOBIN-HEMACUE: Hemoglobin: 11.4 g/dL — ABNORMAL LOW (ref 13.0–17.0)

## 2021-10-05 LAB — FERRITIN: Ferritin: 101 ng/mL (ref 24–336)

## 2021-10-05 MED ORDER — EPOETIN ALFA-EPBX 10000 UNIT/ML IJ SOLN
20000.0000 [IU] | Freq: Once | INTRAMUSCULAR | Status: AC
  Administered 2021-10-05: 20000 [IU] via SUBCUTANEOUS
  Filled 2021-10-05: qty 2

## 2021-10-20 DIAGNOSIS — N184 Chronic kidney disease, stage 4 (severe): Secondary | ICD-10-CM | POA: Diagnosis not present

## 2021-10-20 DIAGNOSIS — E1169 Type 2 diabetes mellitus with other specified complication: Secondary | ICD-10-CM | POA: Diagnosis not present

## 2021-10-20 DIAGNOSIS — E039 Hypothyroidism, unspecified: Secondary | ICD-10-CM | POA: Diagnosis not present

## 2021-10-20 DIAGNOSIS — I10 Essential (primary) hypertension: Secondary | ICD-10-CM | POA: Diagnosis not present

## 2021-10-27 ENCOUNTER — Encounter: Payer: Medicare HMO | Admitting: Vascular Surgery

## 2021-10-29 DIAGNOSIS — E039 Hypothyroidism, unspecified: Secondary | ICD-10-CM | POA: Diagnosis not present

## 2021-10-29 DIAGNOSIS — E1122 Type 2 diabetes mellitus with diabetic chronic kidney disease: Secondary | ICD-10-CM | POA: Diagnosis not present

## 2021-10-29 DIAGNOSIS — N184 Chronic kidney disease, stage 4 (severe): Secondary | ICD-10-CM | POA: Diagnosis not present

## 2021-10-29 DIAGNOSIS — I10 Essential (primary) hypertension: Secondary | ICD-10-CM | POA: Diagnosis not present

## 2021-10-29 DIAGNOSIS — I129 Hypertensive chronic kidney disease with stage 1 through stage 4 chronic kidney disease, or unspecified chronic kidney disease: Secondary | ICD-10-CM | POA: Diagnosis not present

## 2021-10-29 DIAGNOSIS — E1169 Type 2 diabetes mellitus with other specified complication: Secondary | ICD-10-CM | POA: Diagnosis not present

## 2021-10-29 DIAGNOSIS — I429 Cardiomyopathy, unspecified: Secondary | ICD-10-CM | POA: Diagnosis not present

## 2021-10-29 DIAGNOSIS — I272 Pulmonary hypertension, unspecified: Secondary | ICD-10-CM | POA: Diagnosis not present

## 2021-10-29 DIAGNOSIS — E785 Hyperlipidemia, unspecified: Secondary | ICD-10-CM | POA: Diagnosis not present

## 2021-11-16 ENCOUNTER — Encounter (HOSPITAL_COMMUNITY)
Admission: RE | Admit: 2021-11-16 | Discharge: 2021-11-16 | Disposition: A | Discharge status: Home / Self Care | Payer: Medicare HMO | Source: Ambulatory Visit | Attending: Nephrology | Admitting: Nephrology

## 2021-11-16 DIAGNOSIS — D631 Anemia in chronic kidney disease: Secondary | ICD-10-CM | POA: Diagnosis not present

## 2021-11-16 DIAGNOSIS — N184 Chronic kidney disease, stage 4 (severe): Secondary | ICD-10-CM | POA: Diagnosis not present

## 2021-11-16 DIAGNOSIS — J9601 Acute respiratory failure with hypoxia: Secondary | ICD-10-CM

## 2021-11-16 LAB — IRON AND TIBC
Iron: 79 ug/dL (ref 45–182)
Saturation Ratios: 29 % (ref 17.9–39.5)
TIBC: 271 ug/dL (ref 250–450)
UIBC: 192 ug/dL

## 2021-11-16 LAB — POCT HEMOGLOBIN-HEMACUE: Hemoglobin: 11.2 g/dL — ABNORMAL LOW (ref 13.0–17.0)

## 2021-11-16 LAB — FERRITIN: Ferritin: 81 ng/mL (ref 24–336)

## 2021-11-16 MED ORDER — EPOETIN ALFA-EPBX 10000 UNIT/ML IJ SOLN: 20000.0000 [IU] | Freq: Once | INTRAMUSCULAR | Status: DC

## 2021-12-01 ENCOUNTER — Telehealth: Payer: Self-pay | Admitting: Cardiology

## 2021-12-01 DIAGNOSIS — N184 Chronic kidney disease, stage 4 (severe): Secondary | ICD-10-CM | POA: Diagnosis not present

## 2021-12-01 DIAGNOSIS — E039 Hypothyroidism, unspecified: Secondary | ICD-10-CM | POA: Diagnosis not present

## 2021-12-01 DIAGNOSIS — E1122 Type 2 diabetes mellitus with diabetic chronic kidney disease: Secondary | ICD-10-CM | POA: Diagnosis not present

## 2021-12-01 DIAGNOSIS — I10 Essential (primary) hypertension: Secondary | ICD-10-CM | POA: Diagnosis not present

## 2021-12-01 DIAGNOSIS — E785 Hyperlipidemia, unspecified: Secondary | ICD-10-CM | POA: Diagnosis not present

## 2021-12-01 NOTE — Telephone Encounter (Signed)
-  Spoke with Legrand Como a pharmacist from [facility]. -He report pt is currently taking gabapentin for diabetic neuropathy but is not experiencing any relive, and kidney function is limiting use of other medication. -He report pcp wanted to check with Dr. Ellyn Hack if pt is ok to take amitriptyline or nortriptyline due to cardiac caution.

## 2021-12-01 NOTE — Telephone Encounter (Signed)
Michael at [facility] called to see if patient can have a change in medication to amitriptyline (or similar alternative) for his neuropathy.

## 2021-12-02 DIAGNOSIS — Z7984 Long term (current) use of oral hypoglycemic drugs: Secondary | ICD-10-CM | POA: Diagnosis not present

## 2021-12-02 DIAGNOSIS — E113512 Type 2 diabetes mellitus with proliferative diabetic retinopathy with macular edema, left eye: Secondary | ICD-10-CM | POA: Diagnosis not present

## 2021-12-02 DIAGNOSIS — Z961 Presence of intraocular lens: Secondary | ICD-10-CM | POA: Diagnosis not present

## 2021-12-02 DIAGNOSIS — E113211 Type 2 diabetes mellitus with mild nonproliferative diabetic retinopathy with macular edema, right eye: Secondary | ICD-10-CM | POA: Diagnosis not present

## 2021-12-02 DIAGNOSIS — H43813 Vitreous degeneration, bilateral: Secondary | ICD-10-CM | POA: Diagnosis not present

## 2021-12-02 NOTE — Telephone Encounter (Signed)
We will have to defer to our clinical pharmacist.  There is some concern with these medicines and delayed conduction/arrhythmias.  But his EF is back to normal.  My other concern would be renal clearance.  [place]

## 2021-12-03 NOTE — Telephone Encounter (Signed)
Called [name] [name]  and informed - waiting for  a response from Clinical pharmacist with [facility].  Will contact when response is given.  [name] [name] verbalized understanding

## 2021-12-08 DIAGNOSIS — E1122 Type 2 diabetes mellitus with diabetic chronic kidney disease: Secondary | ICD-10-CM | POA: Diagnosis not present

## 2021-12-08 DIAGNOSIS — N185 Chronic kidney disease, stage 5: Secondary | ICD-10-CM | POA: Diagnosis not present

## 2021-12-08 DIAGNOSIS — N184 Chronic kidney disease, stage 4 (severe): Secondary | ICD-10-CM | POA: Diagnosis not present

## 2021-12-08 DIAGNOSIS — D631 Anemia in chronic kidney disease: Secondary | ICD-10-CM | POA: Diagnosis not present

## 2021-12-08 DIAGNOSIS — I5042 Chronic combined systolic (congestive) and diastolic (congestive) heart failure: Secondary | ICD-10-CM | POA: Diagnosis not present

## 2021-12-08 DIAGNOSIS — N189 Chronic kidney disease, unspecified: Secondary | ICD-10-CM | POA: Diagnosis not present

## 2021-12-08 DIAGNOSIS — I120 Hypertensive chronic kidney disease with stage 5 chronic kidney disease or end stage renal disease: Secondary | ICD-10-CM | POA: Diagnosis not present

## 2021-12-08 DIAGNOSIS — N2581 Secondary hyperparathyroidism of renal origin: Secondary | ICD-10-CM | POA: Diagnosis not present

## 2021-12-08 NOTE — Telephone Encounter (Signed)
Called to speak to Legrand Como - he was not available  speak to patient.  RN gave information per Pharmacist and Dr Ellyn Hack to Rachel Bo to relay to Legrand Como. If any question may  call back

## 2021-12-28 ENCOUNTER — Encounter (HOSPITAL_COMMUNITY)
Admission: RE | Admit: 2021-12-28 | Discharge: 2021-12-28 | Disposition: A | Discharge status: Home / Self Care | Payer: Medicare HMO | Source: Ambulatory Visit | Attending: Nephrology | Admitting: Nephrology

## 2021-12-28 VITALS — BP 160/87 | HR 53 | Temp 97.8°F | Resp 20 | Ht 70.0 in | Wt 188.1 lb

## 2021-12-28 DIAGNOSIS — N184 Chronic kidney disease, stage 4 (severe): Secondary | ICD-10-CM | POA: Diagnosis not present

## 2021-12-28 DIAGNOSIS — D631 Anemia in chronic kidney disease: Secondary | ICD-10-CM | POA: Insufficient documentation

## 2021-12-28 LAB — IRON AND TIBC
Iron: 58 ug/dL (ref 45–182)
Saturation Ratios: 19 % (ref 17.9–39.5)
TIBC: 299 ug/dL (ref 250–450)
UIBC: 241 ug/dL

## 2021-12-28 LAB — POCT HEMOGLOBIN-HEMACUE: Hemoglobin: 10.7 g/dL — ABNORMAL LOW (ref 13.0–17.0)

## 2021-12-28 LAB — FERRITIN: Ferritin: 102 ng/mL (ref 24–336)

## 2021-12-28 MED ORDER — EPOETIN ALFA-EPBX 10000 UNIT/ML IJ SOLN: 20000.0000 [IU] | Freq: Once | INTRAMUSCULAR | Status: DC

## 2021-12-28 MED ORDER — EPOETIN ALFA-EPBX 40000 UNIT/ML IJ SOLN
INTRAMUSCULAR | Status: AC
  Administered 2021-12-28: 20000 [IU]
  Filled 2021-12-28: qty 1

## 2021-12-31 DIAGNOSIS — I10 Essential (primary) hypertension: Secondary | ICD-10-CM | POA: Diagnosis not present

## 2021-12-31 DIAGNOSIS — E1122 Type 2 diabetes mellitus with diabetic chronic kidney disease: Secondary | ICD-10-CM | POA: Diagnosis not present

## 2022-02-08 ENCOUNTER — Encounter (HOSPITAL_COMMUNITY): Payer: Self-pay

## 2022-02-08 ENCOUNTER — Encounter (HOSPITAL_COMMUNITY)
Admission: RE | Admit: 2022-02-08 | Discharge: 2022-02-08 | Disposition: A | Discharge status: Home / Self Care | Payer: Medicare HMO | Source: Ambulatory Visit | Attending: Nephrology | Admitting: Nephrology

## 2022-02-08 VITALS — BP 156/75 | HR 51 | Temp 97.8°F | Resp 18

## 2022-02-08 DIAGNOSIS — N183 Chronic kidney disease, stage 3 unspecified: Secondary | ICD-10-CM | POA: Diagnosis not present

## 2022-02-08 LAB — IRON AND TIBC
Iron: 63 ug/dL (ref 45–182)
Saturation Ratios: 20 % (ref 17.9–39.5)
TIBC: 310 ug/dL (ref 250–450)
UIBC: 247 ug/dL

## 2022-02-08 LAB — FERRITIN: Ferritin: 95 ng/mL (ref 24–336)

## 2022-02-08 LAB — POCT HEMOGLOBIN-HEMACUE: Hemoglobin: 12.6 g/dL — ABNORMAL LOW (ref 13.0–17.0)

## 2022-02-08 MED ORDER — EPOETIN ALFA-EPBX 10000 UNIT/ML IJ SOLN: 20000.0000 [IU] | Freq: Once | INTRAMUSCULAR | Status: DC

## 2022-02-08 MED ORDER — EPOETIN ALFA 10000 UNIT/ML IJ SOLN: 10000.0000 [IU] | Freq: Once | INTRAMUSCULAR | Status: DC

## 2022-02-08 NOTE — Progress Notes (Signed)
No dose of Retacrit given today, Hgb 12.6 .

## 2022-02-09 DIAGNOSIS — E785 Hyperlipidemia, unspecified: Secondary | ICD-10-CM | POA: Diagnosis not present

## 2022-02-09 DIAGNOSIS — Z0000 Encounter for general adult medical examination without abnormal findings: Secondary | ICD-10-CM | POA: Diagnosis not present

## 2022-02-09 DIAGNOSIS — I10 Essential (primary) hypertension: Secondary | ICD-10-CM | POA: Diagnosis not present

## 2022-02-09 DIAGNOSIS — E1169 Type 2 diabetes mellitus with other specified complication: Secondary | ICD-10-CM | POA: Diagnosis not present

## 2022-02-09 DIAGNOSIS — Z125 Encounter for screening for malignant neoplasm of prostate: Secondary | ICD-10-CM | POA: Diagnosis not present

## 2022-02-09 DIAGNOSIS — Z1211 Encounter for screening for malignant neoplasm of colon: Secondary | ICD-10-CM | POA: Diagnosis not present

## 2022-02-23 DIAGNOSIS — Z125 Encounter for screening for malignant neoplasm of prostate: Secondary | ICD-10-CM | POA: Diagnosis not present

## 2022-02-23 DIAGNOSIS — Z0000 Encounter for general adult medical examination without abnormal findings: Secondary | ICD-10-CM | POA: Diagnosis not present

## 2022-02-23 DIAGNOSIS — E785 Hyperlipidemia, unspecified: Secondary | ICD-10-CM | POA: Diagnosis not present

## 2022-02-23 DIAGNOSIS — E1169 Type 2 diabetes mellitus with other specified complication: Secondary | ICD-10-CM | POA: Diagnosis not present

## 2022-02-24 IMAGING — CR DG CHEST 2V
2 series · 2 of 2 positions shown · non-contrast
Comparison: 08/28/2020

CLINICAL DATA: Elevated blood pressure

EXAM:
CHEST - 2 VIEW

[chest ap]
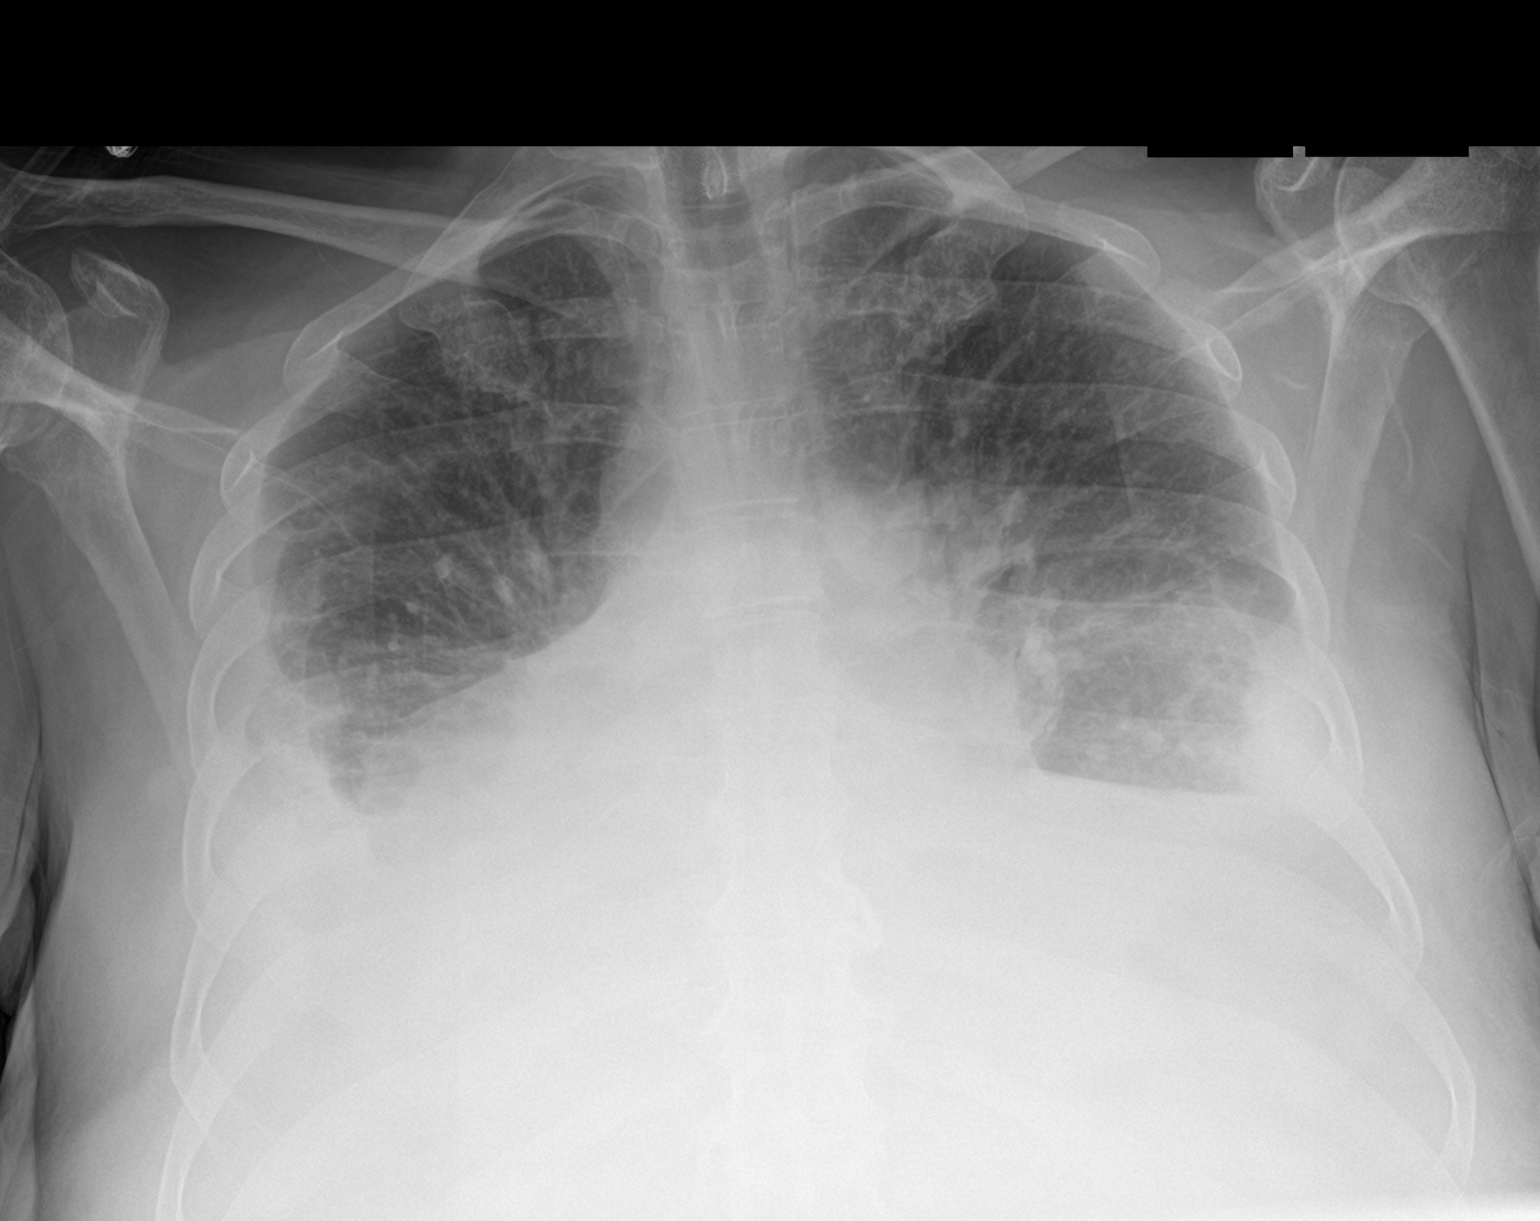

[chest lat]
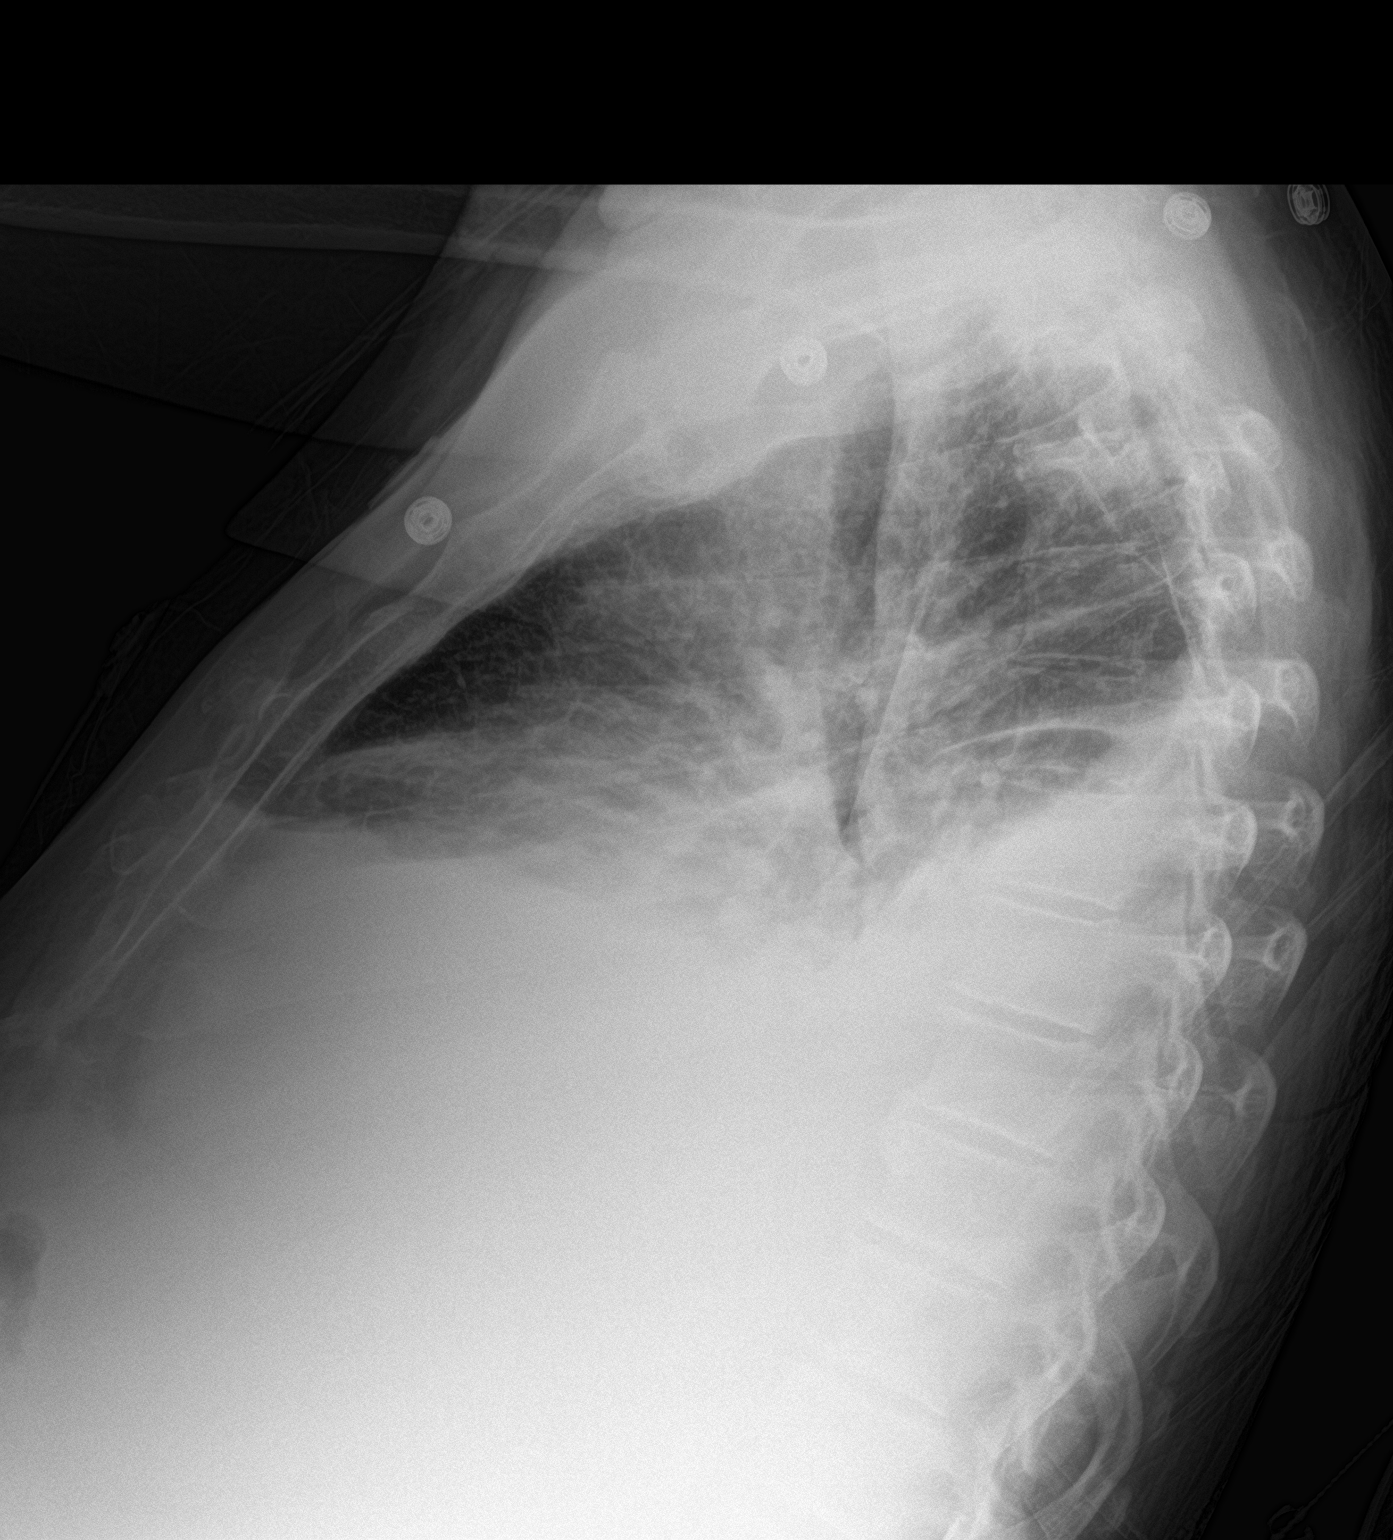

[2 of 2 positions shown; findings below may reference images not displayed]

FINDINGS: Enlargement of cardiac silhouette with pulmonary vascular
congestion.

Mediastinal contours normal.

BILATERAL pulmonary infiltrates likely representing pulmonary edema.

BILATERAL pleural effusions and bibasilar atelectasis.

No pneumothorax or acute osseous findings.
IMPRESSION: Enlargement of cardiac silhouette with pulmonary vascular congestion
and diffuse pulmonary edema, increased from prior exam.

Associated bibasilar pleural effusions and atelectasis, increased
from prior exam greater on RIGHT.

## 2022-02-26 IMAGING — US US BIOPSY
1 series · 6 of 6 positions shown · non-contrast
Comparison: none

INDICATION: Findings syndrome, acute kidney injury and fluid overload. Need for
renal biopsy.

[Series 1: us biopsy (kidney) · 6 of 6 slices shown]
[im 1/6]
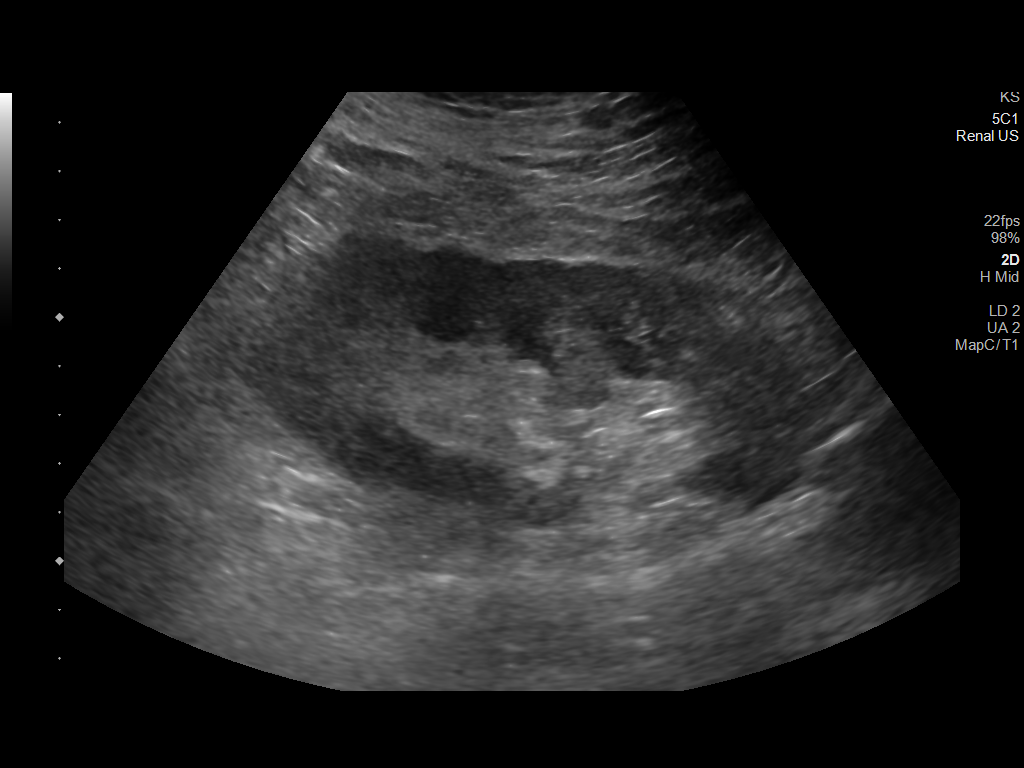
[im 2/6]
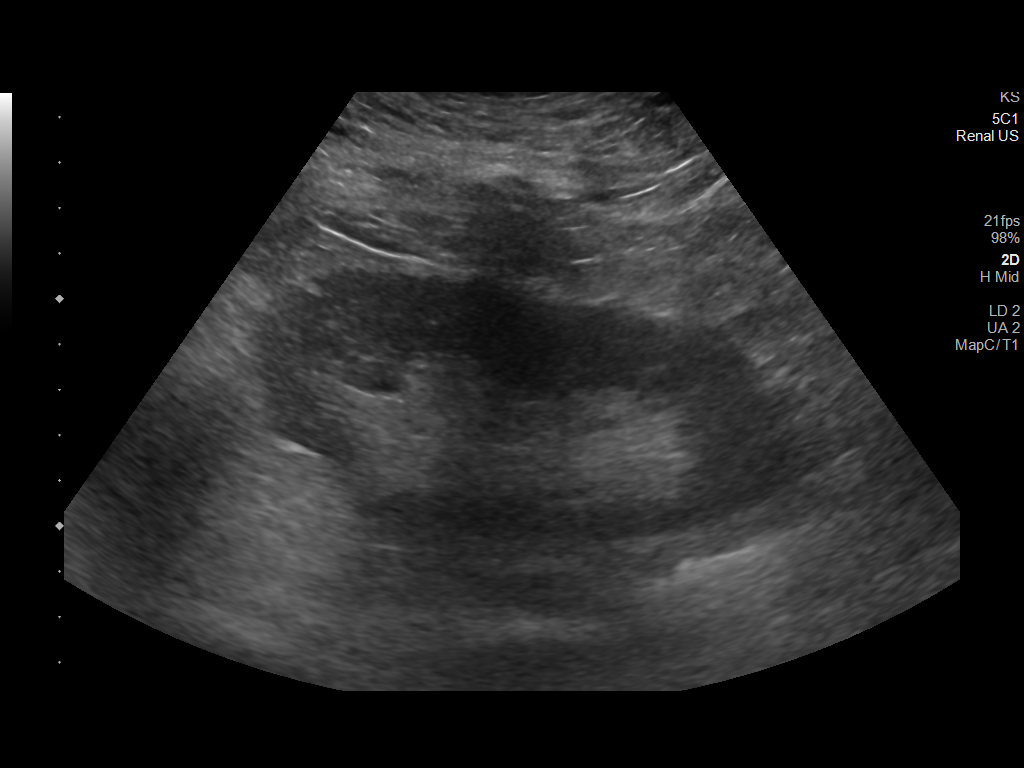
[im 3/6]
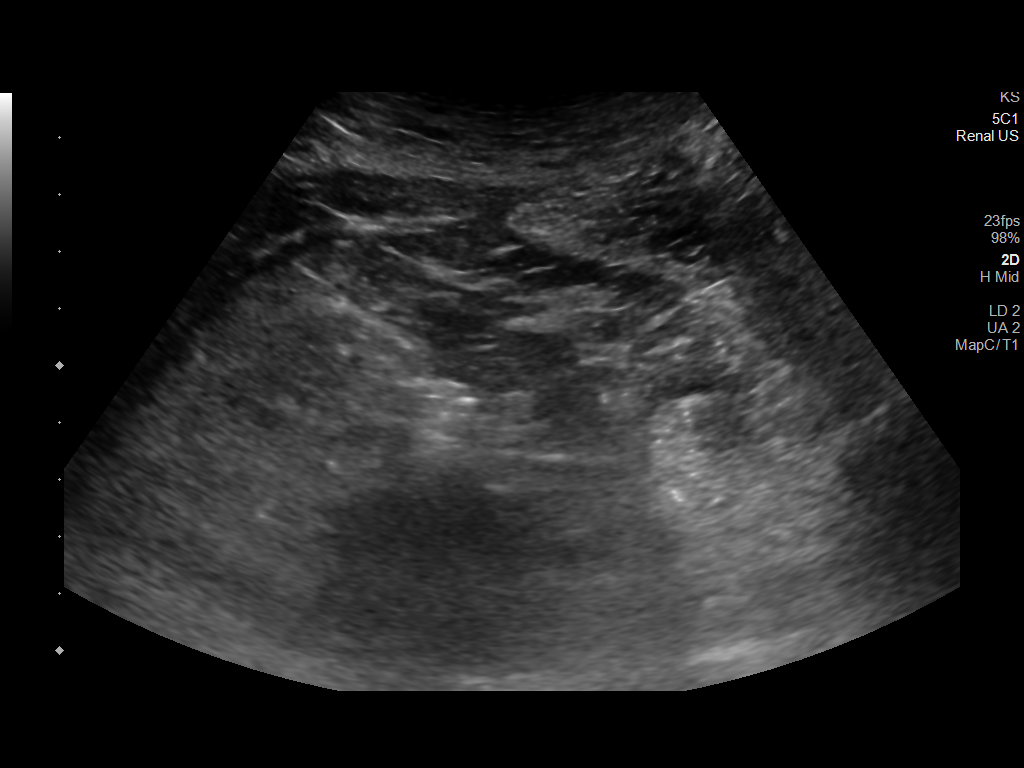
[im 4/6]
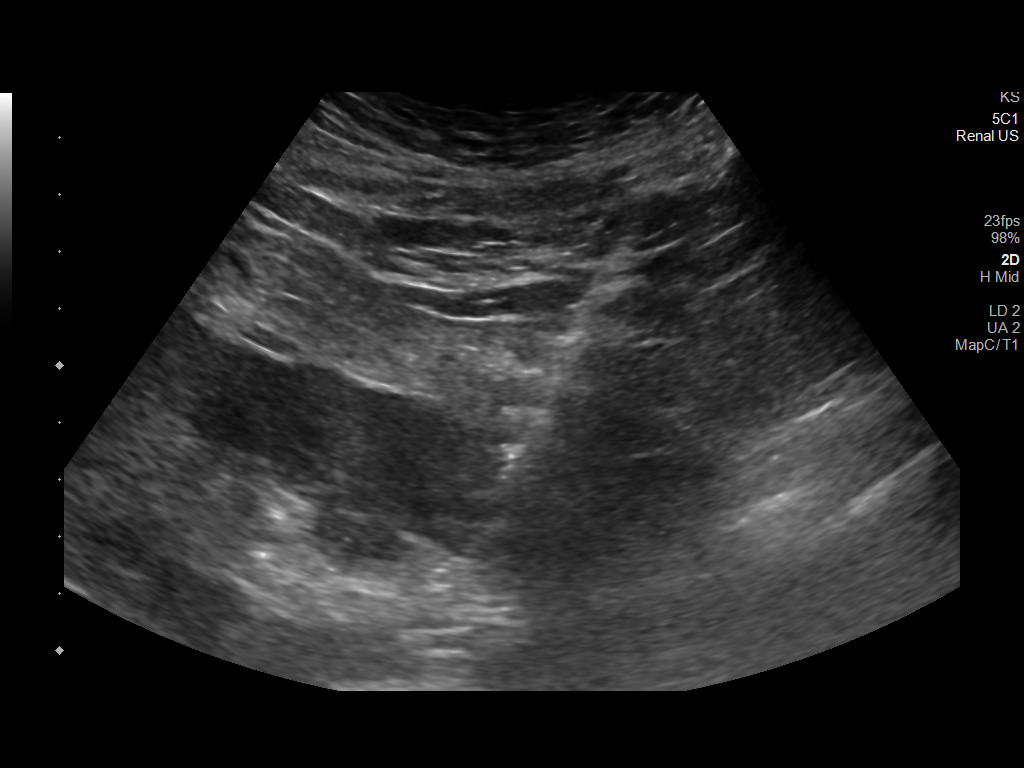
[im 5/6]
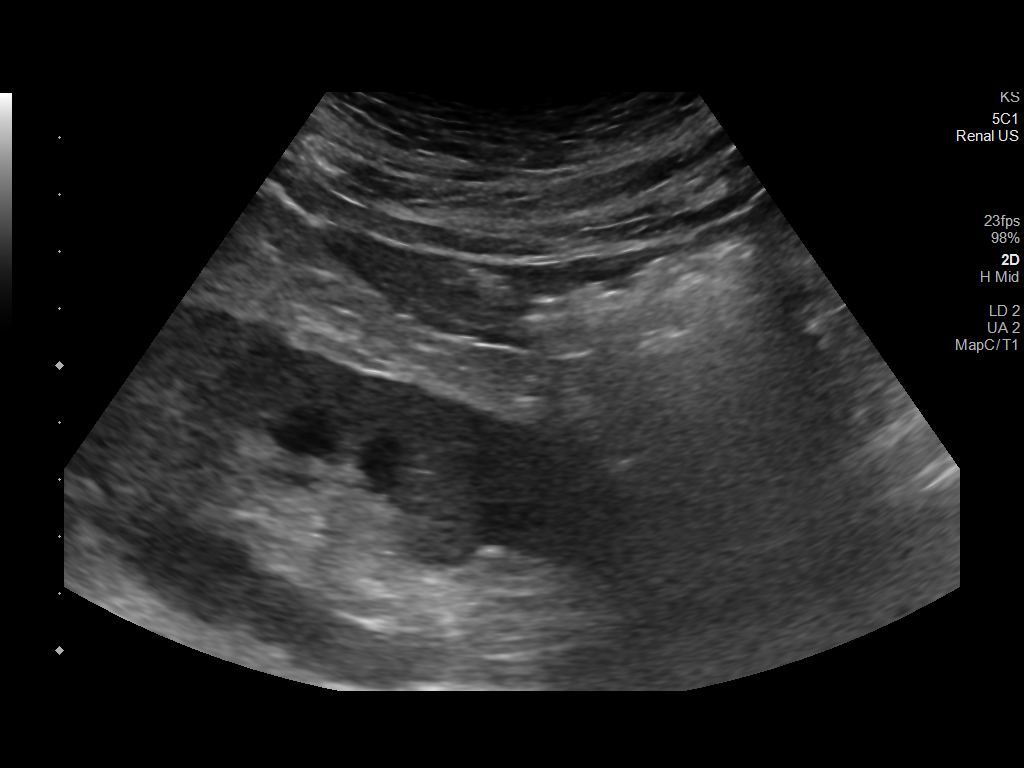
[im 6/6]
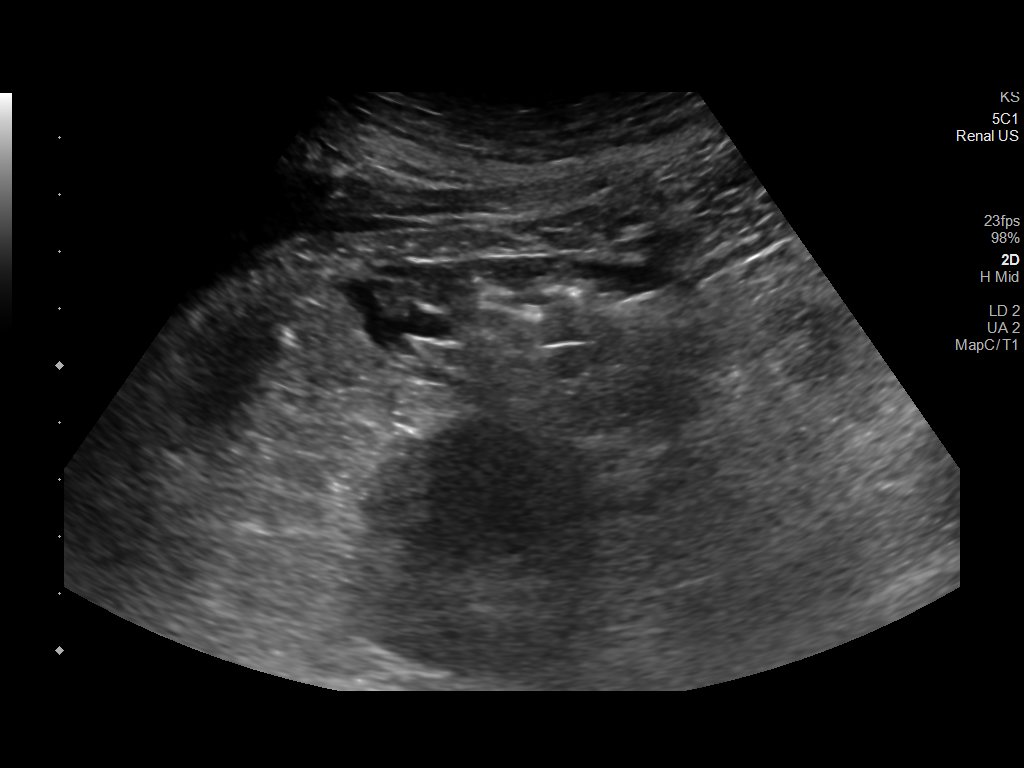

[6 of 6 positions shown; findings below may reference images not displayed]

EXAM:
ULTRASOUND GUIDED CORE BIOPSY OF left kidney

MEDICATIONS:
None.

ANESTHESIA/SEDATION:
Fentanyl 25 mcg IV; Versed 1.0 mg IV

Moderate Sedation Time:  21 minutes.

The patient was continuously monitored during the procedure by the
interventional radiology nurse under my direct supervision.

PROCEDURE:
The procedure, risks, benefits, and alternatives were explained to
the patient. Questions regarding the procedure were encouraged and
answered. The patient understands and consents to the procedure. A
time-out was performed prior to initiating the procedure.

Both kidneys were imaged by ultrasound in a prone position. The left
flank region was prepped with chlorhexidine in a sterile fashion,
and a sterile drape was applied covering the operative field. A
sterile gown and sterile gloves were used for the procedure. Local
anesthesia was provided with 1% Lidocaine.

Under ultrasound guidance, a 15 gauge trocar needle was advanced to
the posterior margin lower pole cortex of the left kidney. Two
separate coaxial 16 gauge core biopsy samples were obtained and
submitted in saline. A slurry of Gel-Foam pledgets mixed in sterile
saline was then injected via the outer needle as the needle was
removed and retracted.

COMPLICATIONS:
None immediate.
FINDINGS: Both kidneys are well visualized with ultrasound with the left
kidney slightly better visualized at the time of biopsy. Solid core
biopsy samples were obtained.
IMPRESSION: Ultrasound-guided core biopsy performed at the level of lower pole
cortex of the left kidney.

## 2022-03-01 IMAGING — DX DG CHEST 1V PORT
1 series · 1 of 1 positions shown · non-contrast
Comparison: 09/02/2020 chest radiograph.

CLINICAL DATA: Pleural effusion

EXAM:
PORTABLE CHEST 1 VIEW

[chest]
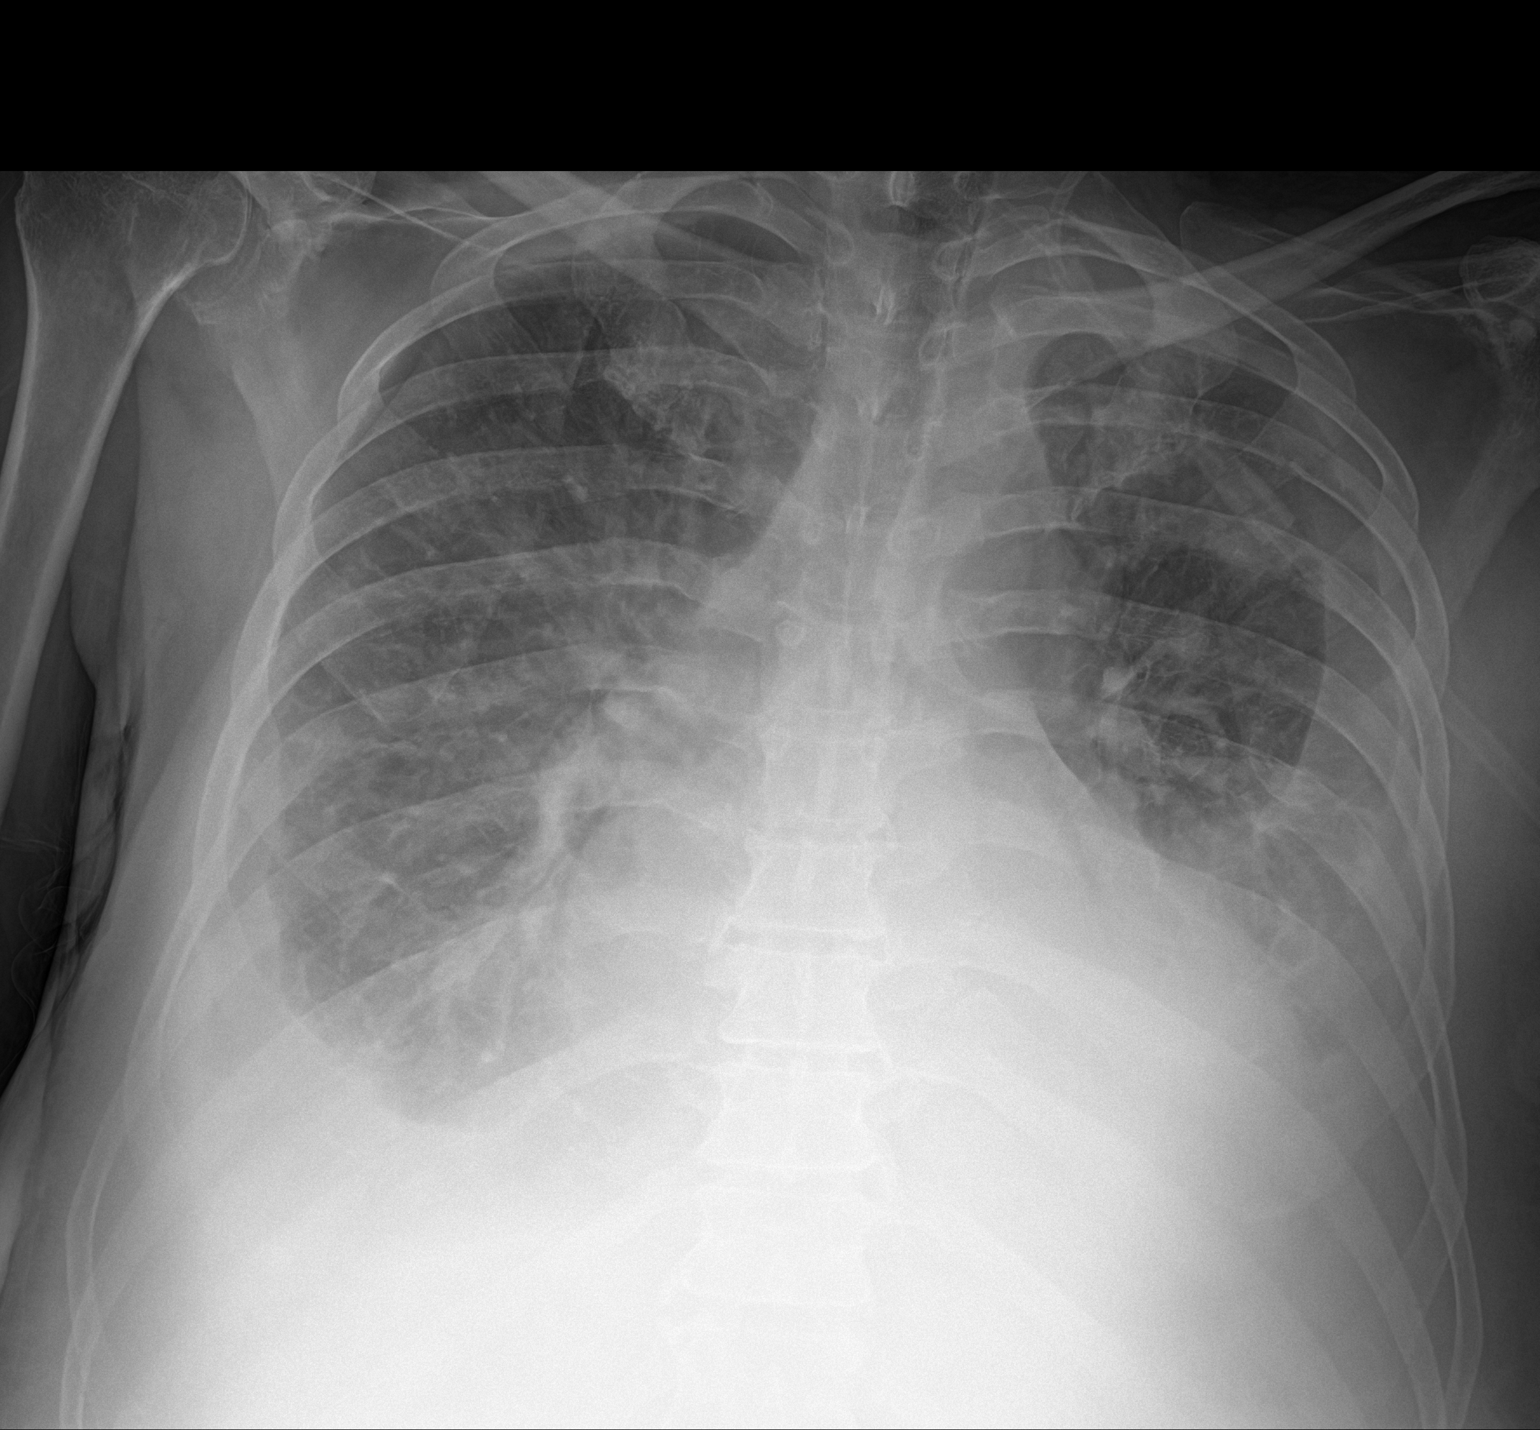

[1 of 1 positions shown; findings below may reference images not displayed]

FINDINGS: Stable cardiomediastinal silhouette with mild cardiomegaly. No
pneumothorax. Small right and small to moderate left pleural
effusions, similar. Mild pulmonary edema, similar. Bibasilar
atelectasis, similar.
IMPRESSION: Stable mild congestive heart failure with small right and small to
moderate left pleural effusions and bibasilar atelectasis.

## 2022-03-03 DIAGNOSIS — N2581 Secondary hyperparathyroidism of renal origin: Secondary | ICD-10-CM | POA: Diagnosis not present

## 2022-03-03 DIAGNOSIS — N185 Chronic kidney disease, stage 5: Secondary | ICD-10-CM | POA: Diagnosis not present

## 2022-03-03 DIAGNOSIS — E1122 Type 2 diabetes mellitus with diabetic chronic kidney disease: Secondary | ICD-10-CM | POA: Diagnosis not present

## 2022-03-03 DIAGNOSIS — I5042 Chronic combined systolic (congestive) and diastolic (congestive) heart failure: Secondary | ICD-10-CM | POA: Diagnosis not present

## 2022-03-03 DIAGNOSIS — I120 Hypertensive chronic kidney disease with stage 5 chronic kidney disease or end stage renal disease: Secondary | ICD-10-CM | POA: Diagnosis not present

## 2022-03-03 DIAGNOSIS — D631 Anemia in chronic kidney disease: Secondary | ICD-10-CM | POA: Diagnosis not present

## 2022-03-03 DIAGNOSIS — E785 Hyperlipidemia, unspecified: Secondary | ICD-10-CM | POA: Diagnosis not present

## 2022-03-22 ENCOUNTER — Encounter (HOSPITAL_COMMUNITY)
Admission: RE | Admit: 2022-03-22 | Discharge: 2022-03-22 | Disposition: A | Discharge status: Home / Self Care | Payer: Medicare HMO | Source: Ambulatory Visit | Attending: Nephrology | Admitting: Nephrology

## 2022-03-22 DIAGNOSIS — N184 Chronic kidney disease, stage 4 (severe): Secondary | ICD-10-CM | POA: Insufficient documentation

## 2022-03-22 DIAGNOSIS — D631 Anemia in chronic kidney disease: Secondary | ICD-10-CM | POA: Insufficient documentation

## 2022-03-22 DIAGNOSIS — N189 Chronic kidney disease, unspecified: Secondary | ICD-10-CM | POA: Diagnosis present

## 2022-03-22 LAB — IRON AND TIBC
Iron: 62 ug/dL (ref 45–182)
Saturation Ratios: 20 % (ref 17.9–39.5)
TIBC: 303 ug/dL (ref 250–450)
UIBC: 241 ug/dL

## 2022-03-22 LAB — FERRITIN: Ferritin: 79 ng/mL (ref 24–336)

## 2022-03-22 LAB — POCT HEMOGLOBIN-HEMACUE: Hemoglobin: 10.6 g/dL — ABNORMAL LOW (ref 13.0–17.0)

## 2022-03-22 MED ORDER — EPOETIN ALFA-EPBX 40000 UNIT/ML IJ SOLN
INTRAMUSCULAR | Status: AC
  Administered 2022-03-22: 20000 [IU]
  Filled 2022-03-22: qty 1

## 2022-03-22 MED ORDER — EPOETIN ALFA-EPBX 40000 UNIT/ML IJ SOLN: 20000.0000 [IU] | Freq: Once | INTRAMUSCULAR | Status: AC

## 2022-03-23 DIAGNOSIS — H3589 Other specified retinal disorders: Secondary | ICD-10-CM | POA: Diagnosis not present

## 2022-03-23 DIAGNOSIS — E113211 Type 2 diabetes mellitus with mild nonproliferative diabetic retinopathy with macular edema, right eye: Secondary | ICD-10-CM | POA: Diagnosis not present

## 2022-03-23 DIAGNOSIS — E113512 Type 2 diabetes mellitus with proliferative diabetic retinopathy with macular edema, left eye: Secondary | ICD-10-CM | POA: Diagnosis not present

## 2022-05-03 ENCOUNTER — Encounter (HOSPITAL_COMMUNITY): Payer: Medicare HMO

## 2022-05-10 ENCOUNTER — Encounter (HOSPITAL_COMMUNITY)
Admission: RE | Admit: 2022-05-10 | Discharge: 2022-05-10 | Disposition: A | Discharge status: Home / Self Care | Payer: Medicare HMO | Source: Ambulatory Visit | Attending: Nephrology | Admitting: Nephrology

## 2022-05-10 VITALS — BP 154/78 | HR 52 | Temp 97.7°F | Resp 16 | Ht 70.0 in | Wt 188.1 lb

## 2022-05-10 DIAGNOSIS — D631 Anemia in chronic kidney disease: Secondary | ICD-10-CM | POA: Insufficient documentation

## 2022-05-10 DIAGNOSIS — N184 Chronic kidney disease, stage 4 (severe): Secondary | ICD-10-CM | POA: Diagnosis not present

## 2022-05-10 DIAGNOSIS — N183 Chronic kidney disease, stage 3 unspecified: Secondary | ICD-10-CM | POA: Diagnosis present

## 2022-05-10 LAB — IRON AND TIBC
Iron: 62 ug/dL (ref 45–182)
Saturation Ratios: 21 % (ref 17.9–39.5)
TIBC: 300 ug/dL (ref 250–450)
UIBC: 238 ug/dL

## 2022-05-10 LAB — FERRITIN: Ferritin: 77 ng/mL (ref 24–336)

## 2022-05-10 LAB — POCT HEMOGLOBIN-HEMACUE: Hemoglobin: 9.9 g/dL — ABNORMAL LOW (ref 13.0–17.0)

## 2022-05-10 MED ORDER — EPOETIN ALFA-EPBX 10000 UNIT/ML IJ SOLN
INTRAMUSCULAR | Status: AC
  Filled 2022-05-10: qty 2

## 2022-05-10 MED ORDER — EPOETIN ALFA-EPBX 10000 UNIT/ML IJ SOLN
20000.0000 [IU] | Freq: Once | INTRAMUSCULAR | Status: AC
  Administered 2022-05-10: 20000 [IU] via SUBCUTANEOUS

## 2022-05-16 DIAGNOSIS — E785 Hyperlipidemia, unspecified: Secondary | ICD-10-CM | POA: Diagnosis not present

## 2022-05-16 DIAGNOSIS — N2581 Secondary hyperparathyroidism of renal origin: Secondary | ICD-10-CM | POA: Diagnosis not present

## 2022-05-16 DIAGNOSIS — D631 Anemia in chronic kidney disease: Secondary | ICD-10-CM | POA: Diagnosis not present

## 2022-05-16 DIAGNOSIS — E1122 Type 2 diabetes mellitus with diabetic chronic kidney disease: Secondary | ICD-10-CM | POA: Diagnosis not present

## 2022-05-16 DIAGNOSIS — I5042 Chronic combined systolic (congestive) and diastolic (congestive) heart failure: Secondary | ICD-10-CM | POA: Diagnosis not present

## 2022-05-16 DIAGNOSIS — N185 Chronic kidney disease, stage 5: Secondary | ICD-10-CM | POA: Diagnosis not present

## 2022-05-16 DIAGNOSIS — I130 Hypertensive heart and chronic kidney disease with heart failure and stage 1 through stage 4 chronic kidney disease, or unspecified chronic kidney disease: Secondary | ICD-10-CM | POA: Diagnosis not present

## 2022-05-25 DIAGNOSIS — E113512 Type 2 diabetes mellitus with proliferative diabetic retinopathy with macular edema, left eye: Secondary | ICD-10-CM | POA: Diagnosis not present

## 2022-05-25 DIAGNOSIS — H35371 Puckering of macula, right eye: Secondary | ICD-10-CM | POA: Diagnosis not present

## 2022-05-25 DIAGNOSIS — E113211 Type 2 diabetes mellitus with mild nonproliferative diabetic retinopathy with macular edema, right eye: Secondary | ICD-10-CM | POA: Diagnosis not present

## 2022-05-25 DIAGNOSIS — Z961 Presence of intraocular lens: Secondary | ICD-10-CM | POA: Diagnosis not present

## 2022-05-25 DIAGNOSIS — H3554 Dystrophies primarily involving the retinal pigment epithelium: Secondary | ICD-10-CM | POA: Diagnosis not present

## 2022-05-25 DIAGNOSIS — H43813 Vitreous degeneration, bilateral: Secondary | ICD-10-CM | POA: Diagnosis not present

## 2022-06-09 ENCOUNTER — Other Ambulatory Visit: Payer: Self-pay

## 2022-06-09 DIAGNOSIS — D631 Anemia in chronic kidney disease: Secondary | ICD-10-CM | POA: Insufficient documentation

## 2022-06-15 DIAGNOSIS — E1169 Type 2 diabetes mellitus with other specified complication: Secondary | ICD-10-CM | POA: Diagnosis not present

## 2022-06-15 DIAGNOSIS — I10 Essential (primary) hypertension: Secondary | ICD-10-CM | POA: Diagnosis not present

## 2022-06-15 DIAGNOSIS — I429 Cardiomyopathy, unspecified: Secondary | ICD-10-CM | POA: Diagnosis not present

## 2022-06-15 DIAGNOSIS — N185 Chronic kidney disease, stage 5: Secondary | ICD-10-CM | POA: Diagnosis not present

## 2022-06-15 DIAGNOSIS — E785 Hyperlipidemia, unspecified: Secondary | ICD-10-CM | POA: Diagnosis not present

## 2022-06-15 DIAGNOSIS — I272 Pulmonary hypertension, unspecified: Secondary | ICD-10-CM | POA: Diagnosis not present

## 2022-06-15 DIAGNOSIS — R059 Cough, unspecified: Secondary | ICD-10-CM | POA: Diagnosis not present

## 2022-06-15 DIAGNOSIS — E1122 Type 2 diabetes mellitus with diabetic chronic kidney disease: Secondary | ICD-10-CM | POA: Diagnosis not present

## 2022-06-21 ENCOUNTER — Encounter (HOSPITAL_COMMUNITY): Admission: RE | Admit: 2022-06-21 | Payer: Medicare HMO | Source: Ambulatory Visit

## 2022-06-23 ENCOUNTER — Encounter (HOSPITAL_COMMUNITY)
Admission: RE | Admit: 2022-06-23 | Discharge: 2022-06-23 | Disposition: A | Discharge status: Home / Self Care | Payer: Medicare HMO | Source: Ambulatory Visit | Attending: Nephrology | Admitting: Nephrology

## 2022-06-23 VITALS — BP 166/78 | HR 58 | Temp 97.5°F | Resp 16

## 2022-06-23 DIAGNOSIS — D631 Anemia in chronic kidney disease: Secondary | ICD-10-CM | POA: Insufficient documentation

## 2022-06-23 DIAGNOSIS — N184 Chronic kidney disease, stage 4 (severe): Secondary | ICD-10-CM | POA: Insufficient documentation

## 2022-06-23 LAB — IRON AND TIBC
Iron: 53 ug/dL (ref 45–182)
Saturation Ratios: 21 % (ref 17.9–39.5)
TIBC: 252 ug/dL (ref 250–450)
UIBC: 199 ug/dL

## 2022-06-23 LAB — FERRITIN: Ferritin: 83 ng/mL (ref 24–336)

## 2022-06-23 LAB — POCT HEMOGLOBIN-HEMACUE: Hemoglobin: 11.1 g/dL — ABNORMAL LOW (ref 13.0–17.0)

## 2022-06-23 MED ORDER — CLONIDINE HCL 0.1 MG PO TABS: 0.1000 mg | ORAL_TABLET | ORAL | Status: DC | PRN

## 2022-06-23 MED ORDER — EPOETIN ALFA-EPBX 10000 UNIT/ML IJ SOLN
20000.0000 [IU] | Freq: Once | INTRAMUSCULAR | Status: AC
  Administered 2022-06-23: 20000 [IU] via SUBCUTANEOUS

## 2022-06-23 NOTE — Progress Notes (Signed)
Diagnosis: Anemia in Chronic Kidney Disease  Provider:  Madelon Lips MD  Procedure: Injection  Retacrit (epoetin alfa-epbx), Dose: 20000 Units, Site: subcutaneous, Number of injections: 1   Hgb 11.1  Post Care: Patient declined observation  Discharge: Condition: Good, Destination: Home . AVS provided to patient.   Performed by:  Baxter Hire, RN

## 2022-06-23 NOTE — Addendum Note (Signed)
Encounter addended by: Baxter Hire, RN on: 06/23/2022 1:50 PM  Actions taken: MAR administration edited, MAR administration accepted

## 2022-06-23 NOTE — Addendum Note (Signed)
Encounter addended by: Baxter Hire, RN on: 06/23/2022 1:50 PM  Actions taken: Therapy plan modified

## 2022-06-30 DIAGNOSIS — E113512 Type 2 diabetes mellitus with proliferative diabetic retinopathy with macular edema, left eye: Secondary | ICD-10-CM | POA: Diagnosis not present

## 2022-06-30 DIAGNOSIS — Z961 Presence of intraocular lens: Secondary | ICD-10-CM | POA: Diagnosis not present

## 2022-06-30 DIAGNOSIS — E113211 Type 2 diabetes mellitus with mild nonproliferative diabetic retinopathy with macular edema, right eye: Secondary | ICD-10-CM | POA: Diagnosis not present

## 2022-06-30 DIAGNOSIS — H3589 Other specified retinal disorders: Secondary | ICD-10-CM | POA: Diagnosis not present

## 2022-06-30 DIAGNOSIS — H43813 Vitreous degeneration, bilateral: Secondary | ICD-10-CM | POA: Diagnosis not present

## 2022-07-26 ENCOUNTER — Other Ambulatory Visit: Payer: Self-pay

## 2022-08-01 ENCOUNTER — Encounter (HOSPITAL_COMMUNITY): Admission: RE | Admit: 2022-08-01 | Payer: Medicare HMO | Source: Ambulatory Visit

## 2022-08-03 DIAGNOSIS — D631 Anemia in chronic kidney disease: Secondary | ICD-10-CM | POA: Diagnosis not present

## 2022-08-03 DIAGNOSIS — E785 Hyperlipidemia, unspecified: Secondary | ICD-10-CM | POA: Diagnosis not present

## 2022-08-03 DIAGNOSIS — I130 Hypertensive heart and chronic kidney disease with heart failure and stage 1 through stage 4 chronic kidney disease, or unspecified chronic kidney disease: Secondary | ICD-10-CM | POA: Diagnosis not present

## 2022-08-03 DIAGNOSIS — N185 Chronic kidney disease, stage 5: Secondary | ICD-10-CM | POA: Diagnosis not present

## 2022-08-03 DIAGNOSIS — I5042 Chronic combined systolic (congestive) and diastolic (congestive) heart failure: Secondary | ICD-10-CM | POA: Diagnosis not present

## 2022-08-03 DIAGNOSIS — E1122 Type 2 diabetes mellitus with diabetic chronic kidney disease: Secondary | ICD-10-CM | POA: Diagnosis not present

## 2022-08-03 DIAGNOSIS — N2581 Secondary hyperparathyroidism of renal origin: Secondary | ICD-10-CM | POA: Diagnosis not present

## 2022-08-03 DIAGNOSIS — N189 Chronic kidney disease, unspecified: Secondary | ICD-10-CM | POA: Diagnosis not present

## 2022-08-04 DIAGNOSIS — H3589 Other specified retinal disorders: Secondary | ICD-10-CM | POA: Diagnosis not present

## 2022-08-04 DIAGNOSIS — E113512 Type 2 diabetes mellitus with proliferative diabetic retinopathy with macular edema, left eye: Secondary | ICD-10-CM | POA: Diagnosis not present

## 2022-08-04 DIAGNOSIS — H43813 Vitreous degeneration, bilateral: Secondary | ICD-10-CM | POA: Diagnosis not present

## 2022-08-04 DIAGNOSIS — E113211 Type 2 diabetes mellitus with mild nonproliferative diabetic retinopathy with macular edema, right eye: Secondary | ICD-10-CM | POA: Diagnosis not present

## 2022-08-04 DIAGNOSIS — Z961 Presence of intraocular lens: Secondary | ICD-10-CM | POA: Diagnosis not present

## 2022-08-11 ENCOUNTER — Encounter (HOSPITAL_COMMUNITY)
Admission: RE | Admit: 2022-08-11 | Discharge: 2022-08-11 | Disposition: A | Discharge status: Home / Self Care | Payer: Medicare HMO | Source: Ambulatory Visit | Attending: Nephrology | Admitting: Nephrology

## 2022-08-11 VITALS — BP 156/74 | HR 54 | Temp 97.7°F | Resp 17

## 2022-08-11 DIAGNOSIS — D631 Anemia in chronic kidney disease: Secondary | ICD-10-CM | POA: Insufficient documentation

## 2022-08-11 DIAGNOSIS — N184 Chronic kidney disease, stage 4 (severe): Secondary | ICD-10-CM | POA: Insufficient documentation

## 2022-08-11 LAB — POCT HEMOGLOBIN-HEMACUE: Hemoglobin: 12.3 g/dL — ABNORMAL LOW (ref 13.0–17.0)

## 2022-08-11 LAB — IRON AND TIBC
Iron: 38 ug/dL — ABNORMAL LOW (ref 45–182)
Saturation Ratios: 16 % — ABNORMAL LOW (ref 17.9–39.5)
TIBC: 245 ug/dL — ABNORMAL LOW (ref 250–450)
UIBC: 207 ug/dL

## 2022-08-11 LAB — FERRITIN: Ferritin: 89 ng/mL (ref 24–336)

## 2022-08-11 MED ORDER — CLONIDINE HCL 0.1 MG PO TABS: 0.1000 mg | ORAL_TABLET | ORAL | Status: DC | PRN

## 2022-08-11 MED ORDER — EPOETIN ALFA-EPBX 10000 UNIT/ML IJ SOLN: 20000.0000 [IU] | Freq: Once | INTRAMUSCULAR | Status: DC

## 2022-08-11 NOTE — Progress Notes (Signed)
Diagnosis: Anemia in Chronic Kidney Disease  Provider:  Madelon Lips MD  Procedure: Injection  Retacrit (epoetin alfa-epbx), Dose: 20000 Units, Site: subcutaneous, Number of injections: 0  Hgb 12.3. Injection not administered.   Post Care: Patient declined observation  Discharge: Condition: Stable, Destination: Home . AVS Provided  Performed by:  Binnie Kand, RN

## 2022-08-18 DIAGNOSIS — H6593 Unspecified nonsuppurative otitis media, bilateral: Secondary | ICD-10-CM | POA: Diagnosis not present

## 2022-08-18 DIAGNOSIS — Z03818 Encounter for observation for suspected exposure to other biological agents ruled out: Secondary | ICD-10-CM | POA: Diagnosis not present

## 2022-08-18 DIAGNOSIS — R051 Acute cough: Secondary | ICD-10-CM | POA: Diagnosis not present

## 2022-08-18 DIAGNOSIS — N186 End stage renal disease: Secondary | ICD-10-CM | POA: Diagnosis not present

## 2022-09-12 ENCOUNTER — Encounter (HOSPITAL_COMMUNITY): Payer: Medicare HMO

## 2022-09-15 DIAGNOSIS — I130 Hypertensive heart and chronic kidney disease with heart failure and stage 1 through stage 4 chronic kidney disease, or unspecified chronic kidney disease: Secondary | ICD-10-CM | POA: Diagnosis not present

## 2022-09-15 DIAGNOSIS — E1122 Type 2 diabetes mellitus with diabetic chronic kidney disease: Secondary | ICD-10-CM | POA: Diagnosis not present

## 2022-09-15 DIAGNOSIS — N2581 Secondary hyperparathyroidism of renal origin: Secondary | ICD-10-CM | POA: Diagnosis not present

## 2022-09-15 DIAGNOSIS — N185 Chronic kidney disease, stage 5: Secondary | ICD-10-CM | POA: Diagnosis not present

## 2022-09-15 DIAGNOSIS — N189 Chronic kidney disease, unspecified: Secondary | ICD-10-CM | POA: Diagnosis not present

## 2022-09-15 DIAGNOSIS — D631 Anemia in chronic kidney disease: Secondary | ICD-10-CM | POA: Diagnosis not present

## 2022-09-27 ENCOUNTER — Encounter (HOSPITAL_COMMUNITY)
Admission: RE | Admit: 2022-09-27 | Discharge: 2022-09-27 | Disposition: A | Discharge status: Home / Self Care | Payer: Medicare HMO | Source: Ambulatory Visit | Attending: Nephrology | Admitting: Nephrology

## 2022-09-27 VITALS — BP 167/71 | HR 54 | Temp 97.4°F | Resp 20

## 2022-09-27 DIAGNOSIS — N184 Chronic kidney disease, stage 4 (severe): Secondary | ICD-10-CM | POA: Insufficient documentation

## 2022-09-27 DIAGNOSIS — D631 Anemia in chronic kidney disease: Secondary | ICD-10-CM | POA: Diagnosis not present

## 2022-09-27 LAB — POCT HEMOGLOBIN-HEMACUE: Hemoglobin: 8.4 g/dL — ABNORMAL LOW (ref 13.0–17.0)

## 2022-09-27 LAB — IRON AND TIBC
Iron: 27 ug/dL — ABNORMAL LOW (ref 45–182)
Saturation Ratios: 12 % — ABNORMAL LOW (ref 17.9–39.5)
TIBC: 223 ug/dL — ABNORMAL LOW (ref 250–450)
UIBC: 196 ug/dL

## 2022-09-27 LAB — FERRITIN: Ferritin: 81 ng/mL (ref 24–336)

## 2022-09-27 MED ORDER — CLONIDINE HCL 0.1 MG PO TABS: 0.1000 mg | ORAL_TABLET | ORAL | Status: DC | PRN

## 2022-09-27 MED ORDER — EPOETIN ALFA-EPBX 10000 UNIT/ML IJ SOLN
20000.0000 [IU] | Freq: Once | INTRAMUSCULAR | Status: AC
  Administered 2022-09-27: 20000 [IU] via SUBCUTANEOUS
  Filled 2022-09-27: qty 2

## 2022-09-27 NOTE — Addendum Note (Signed)
Encounter addended by: Wyvonne Lenz, RN on: 09/27/2022 1:47 PM  Actions taken: Flowsheet accepted, Pend clinical note, Clinical Note Signed, Charge Capture section accepted

## 2022-09-27 NOTE — Progress Notes (Signed)
Diagnosis: Anemia in Chronic Kidney Disease  Provider:  Bufford Buttner MD  Procedure: Injection  Retacrit (epoetin alfa-epbx), Dose: 20000 Units, Site: subcutaneous, Number of injections: 2  Hgb 8.4  Post Care: Patient declined observation  Discharge: Condition: Good, Destination: Home . AVS Declined  Performed by:  Wyvonne Lenz, RN

## 2022-09-29 DIAGNOSIS — E113211 Type 2 diabetes mellitus with mild nonproliferative diabetic retinopathy with macular edema, right eye: Secondary | ICD-10-CM | POA: Diagnosis not present

## 2022-09-29 DIAGNOSIS — E113512 Type 2 diabetes mellitus with proliferative diabetic retinopathy with macular edema, left eye: Secondary | ICD-10-CM | POA: Diagnosis not present

## 2022-09-29 DIAGNOSIS — H35353 Cystoid macular degeneration, bilateral: Secondary | ICD-10-CM | POA: Diagnosis not present

## 2022-09-29 DIAGNOSIS — Z961 Presence of intraocular lens: Secondary | ICD-10-CM | POA: Diagnosis not present

## 2022-09-29 DIAGNOSIS — H43813 Vitreous degeneration, bilateral: Secondary | ICD-10-CM | POA: Diagnosis not present

## 2022-09-30 DIAGNOSIS — N186 End stage renal disease: Secondary | ICD-10-CM | POA: Diagnosis not present

## 2022-09-30 DIAGNOSIS — Z992 Dependence on renal dialysis: Secondary | ICD-10-CM | POA: Diagnosis not present

## 2022-10-04 DIAGNOSIS — N186 End stage renal disease: Secondary | ICD-10-CM | POA: Insufficient documentation

## 2022-10-04 DIAGNOSIS — D631 Anemia in chronic kidney disease: Secondary | ICD-10-CM | POA: Insufficient documentation

## 2022-10-05 DIAGNOSIS — N2581 Secondary hyperparathyroidism of renal origin: Secondary | ICD-10-CM | POA: Diagnosis not present

## 2022-10-05 DIAGNOSIS — Z992 Dependence on renal dialysis: Secondary | ICD-10-CM | POA: Diagnosis not present

## 2022-10-05 DIAGNOSIS — N186 End stage renal disease: Secondary | ICD-10-CM | POA: Diagnosis not present

## 2022-10-07 DIAGNOSIS — N2581 Secondary hyperparathyroidism of renal origin: Secondary | ICD-10-CM | POA: Diagnosis not present

## 2022-10-07 DIAGNOSIS — N186 End stage renal disease: Secondary | ICD-10-CM | POA: Diagnosis not present

## 2022-10-07 DIAGNOSIS — Z992 Dependence on renal dialysis: Secondary | ICD-10-CM | POA: Diagnosis not present

## 2022-10-10 DIAGNOSIS — N186 End stage renal disease: Secondary | ICD-10-CM | POA: Diagnosis not present

## 2022-10-10 DIAGNOSIS — Z992 Dependence on renal dialysis: Secondary | ICD-10-CM | POA: Diagnosis not present

## 2022-10-10 DIAGNOSIS — N2581 Secondary hyperparathyroidism of renal origin: Secondary | ICD-10-CM | POA: Diagnosis not present

## 2022-10-12 DIAGNOSIS — N186 End stage renal disease: Secondary | ICD-10-CM | POA: Diagnosis not present

## 2022-10-12 DIAGNOSIS — Z992 Dependence on renal dialysis: Secondary | ICD-10-CM | POA: Diagnosis not present

## 2022-10-12 DIAGNOSIS — N2581 Secondary hyperparathyroidism of renal origin: Secondary | ICD-10-CM | POA: Diagnosis not present

## 2022-10-14 DIAGNOSIS — N2581 Secondary hyperparathyroidism of renal origin: Secondary | ICD-10-CM | POA: Diagnosis not present

## 2022-10-14 DIAGNOSIS — Z992 Dependence on renal dialysis: Secondary | ICD-10-CM | POA: Diagnosis not present

## 2022-10-14 DIAGNOSIS — N186 End stage renal disease: Secondary | ICD-10-CM | POA: Diagnosis not present

## 2022-10-17 DIAGNOSIS — N186 End stage renal disease: Secondary | ICD-10-CM | POA: Diagnosis not present

## 2022-10-17 DIAGNOSIS — Z992 Dependence on renal dialysis: Secondary | ICD-10-CM | POA: Diagnosis not present

## 2022-10-17 DIAGNOSIS — N2581 Secondary hyperparathyroidism of renal origin: Secondary | ICD-10-CM | POA: Diagnosis not present

## 2022-10-19 DIAGNOSIS — N186 End stage renal disease: Secondary | ICD-10-CM | POA: Diagnosis not present

## 2022-10-19 DIAGNOSIS — Z992 Dependence on renal dialysis: Secondary | ICD-10-CM | POA: Diagnosis not present

## 2022-10-19 DIAGNOSIS — N2581 Secondary hyperparathyroidism of renal origin: Secondary | ICD-10-CM | POA: Diagnosis not present

## 2022-10-20 DIAGNOSIS — E1122 Type 2 diabetes mellitus with diabetic chronic kidney disease: Secondary | ICD-10-CM | POA: Diagnosis not present

## 2022-10-20 DIAGNOSIS — Z992 Dependence on renal dialysis: Secondary | ICD-10-CM | POA: Diagnosis not present

## 2022-10-20 DIAGNOSIS — E785 Hyperlipidemia, unspecified: Secondary | ICD-10-CM | POA: Diagnosis not present

## 2022-10-20 DIAGNOSIS — I120 Hypertensive chronic kidney disease with stage 5 chronic kidney disease or end stage renal disease: Secondary | ICD-10-CM | POA: Diagnosis not present

## 2022-10-20 DIAGNOSIS — E1142 Type 2 diabetes mellitus with diabetic polyneuropathy: Secondary | ICD-10-CM | POA: Diagnosis not present

## 2022-10-20 DIAGNOSIS — N186 End stage renal disease: Secondary | ICD-10-CM | POA: Diagnosis not present

## 2022-10-20 DIAGNOSIS — N2581 Secondary hyperparathyroidism of renal origin: Secondary | ICD-10-CM | POA: Diagnosis not present

## 2022-10-21 DIAGNOSIS — N186 End stage renal disease: Secondary | ICD-10-CM | POA: Diagnosis not present

## 2022-10-21 DIAGNOSIS — N2581 Secondary hyperparathyroidism of renal origin: Secondary | ICD-10-CM | POA: Diagnosis not present

## 2022-10-21 DIAGNOSIS — Z992 Dependence on renal dialysis: Secondary | ICD-10-CM | POA: Diagnosis not present

## 2022-10-24 ENCOUNTER — Encounter (HOSPITAL_COMMUNITY): Payer: Medicare HMO

## 2022-10-24 DIAGNOSIS — Z992 Dependence on renal dialysis: Secondary | ICD-10-CM | POA: Diagnosis not present

## 2022-10-24 DIAGNOSIS — N186 End stage renal disease: Secondary | ICD-10-CM | POA: Diagnosis not present

## 2022-10-24 DIAGNOSIS — N2581 Secondary hyperparathyroidism of renal origin: Secondary | ICD-10-CM | POA: Diagnosis not present

## 2022-10-26 DIAGNOSIS — N2581 Secondary hyperparathyroidism of renal origin: Secondary | ICD-10-CM | POA: Diagnosis not present

## 2022-10-26 DIAGNOSIS — Z992 Dependence on renal dialysis: Secondary | ICD-10-CM | POA: Diagnosis not present

## 2022-10-26 DIAGNOSIS — N186 End stage renal disease: Secondary | ICD-10-CM | POA: Diagnosis not present

## 2022-10-28 DIAGNOSIS — N2581 Secondary hyperparathyroidism of renal origin: Secondary | ICD-10-CM | POA: Diagnosis not present

## 2022-10-28 DIAGNOSIS — N186 End stage renal disease: Secondary | ICD-10-CM | POA: Diagnosis not present

## 2022-10-28 DIAGNOSIS — Z992 Dependence on renal dialysis: Secondary | ICD-10-CM | POA: Diagnosis not present

## 2022-10-31 ENCOUNTER — Other Ambulatory Visit: Payer: Self-pay | Admitting: *Deleted

## 2022-10-31 DIAGNOSIS — N2581 Secondary hyperparathyroidism of renal origin: Secondary | ICD-10-CM | POA: Diagnosis not present

## 2022-10-31 DIAGNOSIS — Z992 Dependence on renal dialysis: Secondary | ICD-10-CM | POA: Diagnosis not present

## 2022-10-31 DIAGNOSIS — N183 Chronic kidney disease, stage 3 unspecified: Secondary | ICD-10-CM

## 2022-10-31 DIAGNOSIS — N186 End stage renal disease: Secondary | ICD-10-CM | POA: Diagnosis not present

## 2022-11-02 DIAGNOSIS — N186 End stage renal disease: Secondary | ICD-10-CM | POA: Diagnosis not present

## 2022-11-02 DIAGNOSIS — Z992 Dependence on renal dialysis: Secondary | ICD-10-CM | POA: Diagnosis not present

## 2022-11-02 DIAGNOSIS — N2581 Secondary hyperparathyroidism of renal origin: Secondary | ICD-10-CM | POA: Diagnosis not present

## 2022-11-04 DIAGNOSIS — N186 End stage renal disease: Secondary | ICD-10-CM | POA: Diagnosis not present

## 2022-11-04 DIAGNOSIS — N2581 Secondary hyperparathyroidism of renal origin: Secondary | ICD-10-CM | POA: Diagnosis not present

## 2022-11-04 DIAGNOSIS — Z992 Dependence on renal dialysis: Secondary | ICD-10-CM | POA: Diagnosis not present

## 2022-11-07 DIAGNOSIS — N2581 Secondary hyperparathyroidism of renal origin: Secondary | ICD-10-CM | POA: Diagnosis not present

## 2022-11-07 DIAGNOSIS — Z992 Dependence on renal dialysis: Secondary | ICD-10-CM | POA: Diagnosis not present

## 2022-11-07 DIAGNOSIS — N186 End stage renal disease: Secondary | ICD-10-CM | POA: Diagnosis not present

## 2022-11-08 ENCOUNTER — Encounter (HOSPITAL_COMMUNITY)
Admission: RE | Admit: 2022-11-08 | Payer: Medicare HMO | Source: Ambulatory Visit | Attending: Nephrology | Admitting: Nephrology

## 2022-11-09 DIAGNOSIS — N2581 Secondary hyperparathyroidism of renal origin: Secondary | ICD-10-CM | POA: Diagnosis not present

## 2022-11-09 DIAGNOSIS — Z992 Dependence on renal dialysis: Secondary | ICD-10-CM | POA: Diagnosis not present

## 2022-11-09 DIAGNOSIS — N186 End stage renal disease: Secondary | ICD-10-CM | POA: Diagnosis not present

## 2022-11-10 ENCOUNTER — Ambulatory Visit (HOSPITAL_COMMUNITY)
Admission: RE | Admit: 2022-11-10 | Discharge: 2022-11-10 | Disposition: A | Discharge status: Home / Self Care | Payer: Medicare HMO | Source: Ambulatory Visit | Attending: Vascular Surgery | Admitting: Vascular Surgery

## 2022-11-10 ENCOUNTER — Encounter: Payer: Self-pay | Admitting: Vascular Surgery

## 2022-11-10 ENCOUNTER — Other Ambulatory Visit: Payer: Self-pay

## 2022-11-10 ENCOUNTER — Ambulatory Visit (INDEPENDENT_AMBULATORY_CARE_PROVIDER_SITE_OTHER): Payer: Medicare HMO | Admitting: Vascular Surgery

## 2022-11-10 ENCOUNTER — Ambulatory Visit (INDEPENDENT_AMBULATORY_CARE_PROVIDER_SITE_OTHER)
Admission: RE | Admit: 2022-11-10 | Discharge: 2022-11-10 | Disposition: A | Discharge status: Home / Self Care | Payer: Medicare HMO | Source: Ambulatory Visit | Attending: Vascular Surgery | Admitting: Vascular Surgery

## 2022-11-10 VITALS — BP 132/84 | HR 68 | Temp 98.1°F | Resp 20 | Ht 70.0 in | Wt 161.0 lb

## 2022-11-10 DIAGNOSIS — Z992 Dependence on renal dialysis: Secondary | ICD-10-CM | POA: Diagnosis not present

## 2022-11-10 DIAGNOSIS — N183 Chronic kidney disease, stage 3 unspecified: Secondary | ICD-10-CM | POA: Insufficient documentation

## 2022-11-10 DIAGNOSIS — N186 End stage renal disease: Secondary | ICD-10-CM | POA: Diagnosis not present

## 2022-11-10 NOTE — Progress Notes (Signed)
ASSESSMENT & PLAN   END-STAGE RENAL DISEASE: This patient was referred for evaluation for an AV fistula or graft.  Based on his vein map he does not appear to be a candidate for a fistula although I will look at the veins myself at the time of surgery.  He will more likely require placement of an AV graft.  He is right-handed so we will start on the left side.  He has a weak radial pulse however he has multiphasic signals in both radial and ulnar positions bilaterally.  I have explained the indications for placement of an AV fistula or AV graft. I've explained that if at all possible we will place an AV fistula.  I have reviewed the risks of placement of an AV fistula including but not limited to: failure of the fistula to mature, need for subsequent interventions, and thrombosis. In addition I have reviewed the potential complications of placement of an AV graft. These risks include, but are not limited to, graft thrombosis, graft infection, wound healing problems, bleeding, arm swelling, and steal syndrome. All the patient's questions were answered and they are agreeable to proceed with surgery.    REASON FOR CONSULT:    To evaluate for hemodialysis access.  The consult is requested by Dr. Ronalee Belts.  HPI:   [name] [name] is a 62 y.o. male who was referred for evaluation for hemodialysis access.  I have reviewed the records from the referring office.  We were asked to place new access.  He has a functioning catheter.  He dialyzes on Monday Wednesdays and Fridays.  He denies any recent uremic symptoms.  He does not have a pacemaker.  He has not had previous access in his arms.  He does have a functioning right IJ tunneled dialysis catheter.  He is not on any blood thinners.  Past Medical History:  Diagnosis Date   Arthritis    Diabetes mellitus (HCC)    GERD (gastroesophageal reflux disease)    HTN (hypertension)    Hyperlipidemia    Nephrotic syndrome    Obesity     Family History   Problem Relation Age of Onset   Hypertension Mother    Diabetes Mother    Cancer Father        prostate cancer   Hypertension Maternal Grandfather    Rheum arthritis Maternal Grandmother    CAD Neg Hx     SOCIAL HISTORY: Social History   Tobacco Use   Smoking status: Never   Smokeless tobacco: Never  Substance Use Topics   Alcohol use: Never    No Known Allergies  Current Outpatient Medications  Medication Sig Dispense Refill   Accu-Chek Softclix Lancets lancets      acetaminophen (TYLENOL) 500 MG tablet Take 500 mg by mouth every 6 (six) hours as needed (arthritis).      amLODipine (NORVASC) 10 MG tablet Take 1 tablet (10 mg total) by mouth daily. 30 tablet 3   atorvastatin (LIPITOR) 10 MG tablet Take 10 mg by mouth daily.     benzonatate (TESSALON) 100 MG capsule Take 100 mg by mouth 3 (three) times daily as needed for cough.     Blood Glucose Monitoring Suppl (ACCU-CHEK GUIDE) w/Device KIT in the morning.     Calcium Carbonate-Simethicone 750-80 MG CHEW Chew 1 tablet by mouth daily as needed (heartburn).     famotidine (PEPCID) 20 MG tablet Take 1 tablet (20 mg total) by mouth 2 (two) times daily as needed for heartburn or  indigestion. 60 tablet 3   gabapentin (NEURONTIN) 100 MG capsule Take 100 mg by mouth at bedtime.     glipiZIDE (GLUCOTROL) 5 MG tablet Take 2.5 mg by mouth daily.     hydrALAZINE (APRESOLINE) 25 MG tablet Take 1 tablet (25 mg total) by mouth every 8 (eight) hours. (Patient taking differently: Take 50 mg by mouth 2 (two) times daily.) 90 tablet 3   isosorbide mononitrate (IMDUR) 30 MG 24 hr tablet Take 1 tablet (30 mg total) by mouth daily. 30 tablet 3   loratadine (CLARITIN) 10 MG tablet Take 10 mg by mouth daily.     metolazone (ZAROXOLYN) 2.5 MG tablet Take 1 tablet (2.5 mg total) by mouth every other day. 30 tablet 3   metoprolol tartrate (LOPRESSOR) 50 MG tablet Take 1 tablet (50 mg total) by mouth 2 (two) times daily. 60 tablet 0   torsemide  (DEMADEX) 100 MG tablet Take 1 tablet (100 mg total) by mouth daily. 30 tablet 3   vitamin B-12 (CYANOCOBALAMIN) 1000 MCG tablet Take 1,000 mcg by mouth daily.     No current facility-administered medications for this visit.    REVIEW OF SYSTEMS:  [X]  denotes positive finding, [ ]  denotes negative finding Cardiac  Comments:  Chest pain or chest pressure:    Shortness of breath upon exertion:    Short of breath when lying flat:    Irregular heart rhythm:        Vascular    Pain in calf, thigh, or hip brought on by ambulation: x   Pain in feet at night that wakes you up from your sleep:  x   Blood clot in your veins:    Leg swelling:         Pulmonary    Oxygen at home:    Productive cough:     Wheezing:         Neurologic    Sudden weakness in arms or legs:     Sudden numbness in arms or legs:     Sudden onset of difficulty speaking or slurred speech:    Temporary loss of vision in one eye:     Problems with dizziness:         Gastrointestinal    Blood in stool:     Vomited blood:         Genitourinary    Burning when urinating:     Blood in urine:        Psychiatric    Major depression:         Hematologic    Bleeding problems:    Problems with blood clotting too easily:        Skin    Rashes or ulcers:        Constitutional    Fever or chills:    -  PHYSICAL EXAM:   Vitals:   11/10/22 0932  BP: 132/84  Pulse: 68  Resp: 20  Temp: 98.1 F (36.7 C)  SpO2: 98%  Weight: 161 lb (73 kg)  Height: 5\' 10"  (1.778 m)   Body mass index is 23.1 kg/m. GENERAL: The patient is a well-nourished male, in no acute distress. The vital signs are documented above. CARDIAC: There is a regular rate and rhythm.  VASCULAR: I do not detect carotid bruits. He has diminished radial pulses. He has no significant lower extremity swelling. PULMONARY: There is good air exchange bilaterally without wheezing or rales. ABDOMEN: Soft and non-tender with normal pitched bowel  sounds.  MUSCULOSKELETAL: There are no major deformities. NEUROLOGIC: No focal weakness or paresthesias are detected. SKIN: There are no ulcers or rashes noted. PSYCHIATRIC: The patient has a normal affect.  DATA:    UPPER EXTREMITY VEIN MAP: I have independently interpreted his upper extremity vein map.  On the right side he does not appear to have adequate vein for a fistula.  On the left side he does not appear to have adequate vein for a fistula.  UPPER EXTREMITY ARTERIAL DUPLEX: I have independently interpreted his upper extremity arterial duplex scan.  On the right side he has a biphasic radial signal with a triphasic ulnar signal.  The brachial artery measures 5.6 mm in diameter.  On the left side he has a biphasic radial signal and a triphasic ulnar signal.  The brachial artery measures 4.5 mm in diameter.     Waverly Ferrari [facility]

## 2022-11-11 DIAGNOSIS — N2581 Secondary hyperparathyroidism of renal origin: Secondary | ICD-10-CM | POA: Diagnosis not present

## 2022-11-11 DIAGNOSIS — N186 End stage renal disease: Secondary | ICD-10-CM | POA: Diagnosis not present

## 2022-11-11 DIAGNOSIS — E1122 Type 2 diabetes mellitus with diabetic chronic kidney disease: Secondary | ICD-10-CM | POA: Diagnosis not present

## 2022-11-11 DIAGNOSIS — Z992 Dependence on renal dialysis: Secondary | ICD-10-CM | POA: Diagnosis not present

## 2022-11-14 ENCOUNTER — Encounter (HOSPITAL_COMMUNITY): Payer: Self-pay | Admitting: Vascular Surgery

## 2022-11-14 ENCOUNTER — Telehealth: Payer: Self-pay | Admitting: *Deleted

## 2022-11-14 ENCOUNTER — Telehealth: Payer: Self-pay

## 2022-11-14 DIAGNOSIS — N2581 Secondary hyperparathyroidism of renal origin: Secondary | ICD-10-CM | POA: Diagnosis not present

## 2022-11-14 DIAGNOSIS — Z992 Dependence on renal dialysis: Secondary | ICD-10-CM | POA: Diagnosis not present

## 2022-11-14 DIAGNOSIS — N186 End stage renal disease: Secondary | ICD-10-CM | POA: Diagnosis not present

## 2022-11-14 NOTE — Telephone Encounter (Signed)
Attempted to reach patient at numbers listed, but still no answer. Left VM to return call.   Spoke with Dewayne Hatch at dialysis center and she advised patient has already left and provided an alternate number of [phone]. Call placed to this number, no answer. Unable to leave message- VM not set up.

## 2022-11-14 NOTE — Telephone Encounter (Signed)
I left a message for the surgery scheduler that the pt's procedure will need to be post poned as we have not seen the pt since 07/2021 and will require an in office appt. I left message for the pt that he is going to need an IN OFFICE appt with Dr. Herbie Baltimore or APP. I was going to offer the 4:20 opening that Dr. Herbie Baltimore has today. Left message not sure if that appt will still be open.   In the mean time I will update all parties involved.

## 2022-11-14 NOTE — Telephone Encounter (Signed)
   Name: [name] [name]  DOB: [birth year]  MRN: [id number]  Primary Cardiologist: Bryan Lemma, MD  Chart reviewed as part of pre-operative protocol coverage. Because of [name] [name] past medical history and time since last visit, he will require a follow-up in-office visit in order to better assess preoperative cardiovascular risk.  Pre-op covering staff: - Please schedule appointment and call patient to inform them. If patient already had an upcoming appointment within acceptable timeframe, please add "pre-op clearance" to the appointment notes so provider is aware. - Please contact requesting surgeon's office via preferred method (i.e, phone, fax) to inform them of need for appointment prior to surgery.   Carlos Levering, NP  11/14/2022, 1:23 PM

## 2022-11-14 NOTE — Telephone Encounter (Signed)
Spoke with Koleen Nimrod, RN at [facility] Short stay to advise of surgery cancellation and inability to reach patient. States she will leave a message for the charge nurse, just in case patient shows up for surgery on tomorrow.

## 2022-11-14 NOTE — Progress Notes (Signed)
Anesthesia Chart Review: [name] [name]  Case: [id number] Date/Time: 11/15/22 0917   Procedure: INSERTION OF LEFT ARM ARTERIOVENOUS (AV) GORE-TEX GRAFT (Left)   Anesthesia type: Choice   Pre-op diagnosis: ESRD   Location: [facility] OR ROOM 11 / [facility] OR   Surgeons: Chuck Hint, MD       DISCUSSION: Patient is a 62 year old male scheduled for the above procedure. Per [facility] notes, he is currently using a right IJ [facility] for hemodialysis.   History includes never smoker, DM2, HTN, nephrotic syndrome (July 2022), ESRD (HD MWF), secondary hyperparathyroidism, GERD, dilated cardiomyopathy (July 2022), pulmonary hypertension (in setting of nephrotic syndrome 12/2020).  Last visit with cardiologist Dr. Herbie Baltimore was on 07/19/21. He was initially evaluated in 07/2020 during admission for nephrotic syndrome with pulmonary edema, anasarca, AKI. Echo showed EF 40-45%, grade 2 DD, moderate RV dysfunction, severely dilated PA 86.2 mmHg. Diuresed 10 lbs. Medical management optimized given renal dysfunction and lack of ischemic symptoms. Follow-up TTE 11/20/20 showed EF 45-50%, RVSP 60.1 mmHg, and TTE 12/25/20 showed recovered LVEF 55-60%, no regional wall motion abnormalities, mild MR. Given resolution of dilated CM, no wall motion abnormalities, decision made to hold off on ischemic evaluation. Appears he was not on dialysis at that time. He felt most likely etiology of cardiomyopathy and pulmonary hypertension were volume overload from nephrotic syndrome. He may reconsider ischemic evaluation (ie, RHC/LHC) should patient develop symptoms or be considered for renal transplant. Six month follow-up had been planned.   Reviewed above with anesthesiologist Dr. Willette Alma. Mr. Weyer is overdue for cardiology follow-up for dilated cardiomyopathy and pulmonary hypertension diagnosed in the setting of nephrotic syndrome. Ischemic work-up had been deferred given CKD, lack of symptoms, and improvement in LVEF. Per notes, he is now  on HD and using a [facility] for access, so recommend preoperative cardiology input. Would defer decision for additional testing to cardiology. I notified [facility] RN Georgianne Fick.    VS:  BP Readings from Last 3 Encounters:  11/10/22 132/84  09/27/22 (!) 167/71  08/11/22 (!) 156/74   Pulse Readings from Last 3 Encounters:  11/10/22 68  09/27/22 (!) 54  08/11/22 (!) 54     PROVIDERS: Farris Has, MD is PCP  Nephrologist is with [facility] (Dr. Arlana Pouch, Dr. Crista Elliot)   LABS: For day of surgery.  Last results noted in [facility] Everywhere: Cr 8.58 08/30/22. A1c 5.8%, H/H 10.5/31.4, PLT 193, glucose 85, K 3.8, AST 7, ALT 8 on 06/17/22.    EKG: 07/19/2021: Sinus bradycardia at 51 bpm.  Moderate voltage criteria for LVH, may be normal variant.   CV: Echo 12/25/20: IMPRESSIONS   1. Left ventricular ejection fraction, by estimation, is 55 to 60%. The  left ventricle has normal function. The left ventricle has no regional  wall motion abnormalities. Left ventricular diastolic parameters are  consistent with Grade II diastolic  dysfunction (pseudonormalization). The average left ventricular global  longitudinal strain is -22.0 %. The global longitudinal strain is normal.   2. Right ventricular systolic function is normal. The right ventricular  size is normal.   3. Left atrial size was mild to moderately dilated.   4. Right atrial size was mildly dilated.   5. The mitral valve is normal in structure. Mild mitral valve  regurgitation.   6. The aortic valve is grossly normal. There is mild thickening of the  aortic valve. Aortic valve regurgitation is not visualized. No aortic  stenosis is present.  - Comparison: 08/29/20: LVEF  40-45%, global LV hypokinesis, grade II diastolic dysfunction, moderately reduced RVSF, severely elevated RVSP 86.2 mmHg, moderately dilated LR, mildly dilated RA, large left pleural effusion, circumferential pericardial effusion, moderate MR/TR; 11/20/20:  LVEF 45-50%, global LV hypokinesis, grade III DD, RVSF mildly reduced, RVSP 60.1 mmHg, mildly dilated LA/RA, mild-moderate MR   Past Medical History:  Diagnosis Date   Arthritis    Diabetes mellitus (HCC)    GERD (gastroesophageal reflux disease)    HTN (hypertension)    Hyperlipidemia    Nephrotic syndrome    Obesity    Secondary hyperparathyroidism of renal origin [facility])     Past Surgical History:  Procedure Laterality Date   CATARACT EXTRACTION, BILATERAL  2018   TONSILLECTOMY      MEDICATIONS: No current facility-administered medications for this encounter.    acetaminophen (TYLENOL) 500 MG tablet   amitriptyline (ELAVIL) 10 MG tablet   amLODipine (NORVASC) 5 MG tablet   atorvastatin (LIPITOR) 20 MG tablet   gabapentin (NEURONTIN) 100 MG capsule   hydrALAZINE (APRESOLINE) 25 MG tablet   levothyroxine (SYNTHROID) 50 MCG tablet   metoprolol tartrate (LOPRESSOR) 50 MG tablet   vitamin B-12 (CYANOCOBALAMIN) 1000 MCG tablet   Accu-Chek Softclix Lancets lancets   Blood Glucose Monitoring Suppl (ACCU-CHEK GUIDE) w/Device KIT   famotidine (PEPCID) 20 MG tablet   isosorbide mononitrate (IMDUR) 30 MG 24 hr tablet   metolazone (ZAROXOLYN) 2.5 MG tablet   torsemide (DEMADEX) 100 MG tablet   Meds listed as currently not taking: Pepcid, Imdur, Zaroxolyn, torsemide   Shonna Chock, PA-C Surgical Short Stay/Anesthesiology [facility] Phone [phone] [facility] Phone [phone] 11/14/2022 1:06 PM

## 2022-11-14 NOTE — Progress Notes (Signed)
Pt's mother called. She is aware surgery has been postponed. Advised her to call Dr. Adele Dan office first thing in the morning.

## 2022-11-14 NOTE — Telephone Encounter (Signed)
I received notification per Shonna Chock, PA-c, that anesthesiologist Dr. Armond Hang is requesting cardiac clearance prior to AVG surgery. He sees Dr. Herbie Baltimore, but last visit 07/19/21. They had considered ischemic evaluation when he was seen for low EF and severe pulmonary hypertension--although suspected these where likely from nephrotic syndrome.  He was suppose to get cardiology f/u in 6 months but failed to do so. I have faxed a clearance to Dr. Elissa Hefty office. Dr. Edilia Bo made aware.   Attempted to reach patient on both contact numbers. Left VM to return call. Attempted to reach [place] [facility] but phone rang continuously.

## 2022-11-14 NOTE — Telephone Encounter (Signed)
   Pre-operative Risk Assessment    Patient Name: [name] [name]  DOB: 01-05-1961 MRN: [id number]      Request for Surgical Clearance    Procedure:  INSERTION OF LEFT ARM ARTERIOVENOUS   Date of Surgery:  Clearance 11/15/22                                 Surgeon:  DR. Waverly Ferrari Surgeon's Group or Practice Name:  [facility] Phone number:  [phone] Fax number:  [phone]   Type of Clearance Requested:   - Medical ; NO MEDICATIONS LISTED AS NEEDING TO BE HELD; ANESTHESIOLOGY TEAM REQUESTING CLEARANCE   Type of Anesthesia:  MAC or NERVE BLOCK   Additional requests/questions:    Elpidio Anis   11/14/2022, 1:09 PM

## 2022-11-15 ENCOUNTER — Ambulatory Visit (HOSPITAL_COMMUNITY): Admission: RE | Admit: 2022-11-15 | Payer: Medicare HMO | Source: Home / Self Care | Admitting: Vascular Surgery

## 2022-11-15 HISTORY — DX: Cardiomyopathy, unspecified: I42.9

## 2022-11-15 HISTORY — DX: Secondary hyperparathyroidism of renal origin: N25.81

## 2022-11-15 HISTORY — DX: Pulmonary hypertension, unspecified: I27.20

## 2022-11-15 SURGERY — INSERTION OF ARTERIOVENOUS (AV) GORE-TEX GRAFT ARM
Anesthesia: Choice | Laterality: Left

## 2022-11-15 NOTE — Telephone Encounter (Addendum)
Received a call from patient's mother Doristine Counter stating she is aware of surgery cancellation and the need for patient to see cardiology. Reports she tried to contact Dr. Elissa Hefty office but was told they are on vacation until next week. Advised her that the office should be open to schedule an appointment and offered to contact them for her. She agreed with this.   Contact Dr. Elissa Hefty office- spoke with Jasmin in scheduling. She reports Dr. Elissa Hefty next available appt is in December and the first available APP on a T/TH (non-dialysis daye) is on 7/9 at [facility]. Patient scheduled for this day at 1120 AM with Marjie Skiff.   Spoke with Doristine Counter and informed of appointment information. She voiced understanding.

## 2022-11-16 DIAGNOSIS — N2581 Secondary hyperparathyroidism of renal origin: Secondary | ICD-10-CM | POA: Diagnosis not present

## 2022-11-16 DIAGNOSIS — N186 End stage renal disease: Secondary | ICD-10-CM | POA: Diagnosis not present

## 2022-11-16 DIAGNOSIS — Z992 Dependence on renal dialysis: Secondary | ICD-10-CM | POA: Diagnosis not present

## 2022-11-16 NOTE — Telephone Encounter (Signed)
Pt has appt with Marjie Skiff, [facility] 12/20/22 for pre op clearance. I will update all parties involved. Once the pt has been cleared we will fax notes over to requesting office.

## 2022-11-18 DIAGNOSIS — N2581 Secondary hyperparathyroidism of renal origin: Secondary | ICD-10-CM | POA: Diagnosis not present

## 2022-11-18 DIAGNOSIS — Z992 Dependence on renal dialysis: Secondary | ICD-10-CM | POA: Diagnosis not present

## 2022-11-18 DIAGNOSIS — N186 End stage renal disease: Secondary | ICD-10-CM | POA: Diagnosis not present

## 2022-11-21 DIAGNOSIS — Z992 Dependence on renal dialysis: Secondary | ICD-10-CM | POA: Diagnosis not present

## 2022-11-21 DIAGNOSIS — N2581 Secondary hyperparathyroidism of renal origin: Secondary | ICD-10-CM | POA: Diagnosis not present

## 2022-11-21 DIAGNOSIS — N186 End stage renal disease: Secondary | ICD-10-CM | POA: Diagnosis not present

## 2022-11-23 DIAGNOSIS — N186 End stage renal disease: Secondary | ICD-10-CM | POA: Diagnosis not present

## 2022-11-23 DIAGNOSIS — Z992 Dependence on renal dialysis: Secondary | ICD-10-CM | POA: Diagnosis not present

## 2022-11-23 DIAGNOSIS — N2581 Secondary hyperparathyroidism of renal origin: Secondary | ICD-10-CM | POA: Diagnosis not present

## 2022-11-25 DIAGNOSIS — N2581 Secondary hyperparathyroidism of renal origin: Secondary | ICD-10-CM | POA: Diagnosis not present

## 2022-11-25 DIAGNOSIS — N186 End stage renal disease: Secondary | ICD-10-CM | POA: Diagnosis not present

## 2022-11-25 DIAGNOSIS — Z992 Dependence on renal dialysis: Secondary | ICD-10-CM | POA: Diagnosis not present

## 2022-11-28 DIAGNOSIS — N186 End stage renal disease: Secondary | ICD-10-CM | POA: Diagnosis not present

## 2022-11-28 DIAGNOSIS — Z992 Dependence on renal dialysis: Secondary | ICD-10-CM | POA: Diagnosis not present

## 2022-11-28 DIAGNOSIS — N2581 Secondary hyperparathyroidism of renal origin: Secondary | ICD-10-CM | POA: Diagnosis not present

## 2022-11-30 DIAGNOSIS — N186 End stage renal disease: Secondary | ICD-10-CM | POA: Diagnosis not present

## 2022-11-30 DIAGNOSIS — Z992 Dependence on renal dialysis: Secondary | ICD-10-CM | POA: Diagnosis not present

## 2022-11-30 DIAGNOSIS — N2581 Secondary hyperparathyroidism of renal origin: Secondary | ICD-10-CM | POA: Diagnosis not present

## 2022-12-02 DIAGNOSIS — Z992 Dependence on renal dialysis: Secondary | ICD-10-CM | POA: Diagnosis not present

## 2022-12-02 DIAGNOSIS — N186 End stage renal disease: Secondary | ICD-10-CM | POA: Diagnosis not present

## 2022-12-02 DIAGNOSIS — N2581 Secondary hyperparathyroidism of renal origin: Secondary | ICD-10-CM | POA: Diagnosis not present

## 2022-12-05 DIAGNOSIS — N2581 Secondary hyperparathyroidism of renal origin: Secondary | ICD-10-CM | POA: Diagnosis not present

## 2022-12-05 DIAGNOSIS — N186 End stage renal disease: Secondary | ICD-10-CM | POA: Diagnosis not present

## 2022-12-05 DIAGNOSIS — Z992 Dependence on renal dialysis: Secondary | ICD-10-CM | POA: Diagnosis not present

## 2022-12-07 DIAGNOSIS — N2581 Secondary hyperparathyroidism of renal origin: Secondary | ICD-10-CM | POA: Diagnosis not present

## 2022-12-07 DIAGNOSIS — N186 End stage renal disease: Secondary | ICD-10-CM | POA: Diagnosis not present

## 2022-12-07 DIAGNOSIS — Z992 Dependence on renal dialysis: Secondary | ICD-10-CM | POA: Diagnosis not present

## 2022-12-08 NOTE — Progress Notes (Signed)
Cardiology Office Note:    Date:  12/20/2022   ID:  [name] [name], DOB 1960/12/05, MRN [id number]  PCP:  Farris Has, MD  Cardiologist:  Bryan Lemma, MD  Electrophysiologist:  None   Referring MD: Farris Has, MD   Chief Complaint: pre-op evaluation  History of Present Illness:    [name] [name] is a 62 y.o. male with a history of dilated cardiomyopathy with normalization of EF to 55-60% on Echo in 12/2020, severe pulmonary hypertension noted on Echo in 08/2020 in setting of severe volume overload secondary to nephrotic syndrome (no mention of elevated PASP on repeat Echo in 12/2020), hypertension, hyperlipidemia, type 2 diabetes mellitus, ESRD on hemodialysis M/W/F, nephrotic syndrome, GERD, chronic anemia,  and obesity who is followed by Dr. Herbie Baltimore and presents today for pre-op evaluation for placement of AV graft for dialysis.   Patient was first seen by Dr. Herbie Baltimore during an admission in 08/2020 volume overload and anasarca in setting of nephrotic syndrome. Echo showed LVEF of 40-45% with grade 2 diastolic dysfunction, moderately enlarged RV with moderate RV dysfunction and severely elevated PASP, moderate MR, trace pericardial effusion, and a large left pleural effusion. Renal biopsy showed nodular diabetic glomerulosclerosis. He was diuresed by the Nephrology but Cardiology consulted in regards to his new cardiomyopathy. He was started on GDMT. Cardiac catheterization was deferred due to absence of chest pain and renal function. It was though that his mildly reduced EF may of just been due to his degree of volume overload. Plan was to optimize GDMT and then repeat Echo. Echo in 11/2020 showed LVEF of 45-50% with grade 3 diastolic dysfunction, mildly reduced RV function, mild to moderate MR, and severe pulmonary hypertension with RVSP of 60.1 mmHg. However, repeat Echo one month later in 12/2020 showed LVEF of 55-60% with grade 2 diastolic dysfunction, normal RV, mild MR, and no mention of  elevated pulmonary pressures.  Patient was last seen by Dr. Herbie Baltimore in 07/2021 at which time dyspnea, edema, and energy level was all improving. His dilated cardiomyopathy and pulmonary hypertension were felt to likely be due to volume overload in the setting of his nephrotic syndrome. Given normalization of EF, plan was to hold off on [facility] unless he were to need this for pre-transplant work-up.   Since this visit, patient has started dialysis for ESRD. He presents today for pre-op evaluation for new dialysis access and placement of AV graft. He is here today with his mom who assists with the history. He is overall stable from a cardiac standpoint. He denies any chest pain. He states he will occasionally get short of breath while at dialysis. Otherwise, no shortness of breath. No orthopnea or PND. Edema is well controlled with dialysis. No palpitations. He reports occasional mild dizziness but no no near syncope/ syncope. He ambulates with a walker and is overall not very active. This is primarily due to significant peripheral neuropathy. He has a lot of balance issues because of this and has a history of falls. He is still able to complete 4.0 METS of activity and denies any chest pain or shortness of breath with this.   EKGs/Labs/Other Studies Reviewed:    The following studies were reviewed:  Echocardiogram 08/29/2020: Impressions: 1. Left ventricular ejection fraction, by estimation, is 40 to 45%. The  left ventricle has mildly decreased function. The left ventricle  demonstrates global hypokinesis. Left ventricular diastolic parameters are  consistent with Grade II diastolic  dysfunction (pseudonormalization). Elevated left ventricular end-diastolic  pressure.  2. Right ventricular systolic function is moderately reduced. The right  ventricular size is moderately enlarged. There is severely elevated  pulmonary artery systolic pressure. The estimated right ventricular  systolic pressure is  86.2 mmHg.   3. Left atrial size was moderately dilated.   4. Right atrial size was mildly dilated.   5. The pericardial effusion is circumferential. Large pleural effusion in  the left lateral region.   6. The mitral valve is normal in structure. Moderate mitral valve  regurgitation. No evidence of mitral stenosis.   7. Tricuspid valve regurgitation is moderate.   8. The aortic valve is normal in structure. Aortic valve regurgitation is  not visualized. Mild to moderate aortic valve sclerosis/calcification is  present, without any evidence of aortic stenosis.   9. The inferior vena cava is dilated in size with <50% respiratory  variability, suggesting right atrial pressure of 15 mmHg.  _______________  Echocardiogram 11/20/2020: Impressions:  1. Left ventricular ejection fraction, by estimation, is 45 to 50%. Left  ventricular ejection fraction by 3D volume is 48 %. The left ventricle has  mildly decreased function. The left ventricle demonstrates global  hypokinesis. Left ventricular diastolic   parameters are consistent with Grade III diastolic dysfunction  (restrictive). Elevated left atrial pressure. The E/e' is 37.   2. Right ventricular systolic function is mildly reduced. The right  ventricular size is mildly enlarged. There is severely elevated pulmonary  artery systolic pressure. The estimated right ventricular systolic  pressure is 60.1 mmHg.   3. Left atrial size was mildly dilated.   4. Right atrial size was mildly dilated.   5. The mitral valve is normal in structure. Mild to moderate mitral valve  regurgitation.   6. The aortic valve is tricuspid. There is mild calcification of the  aortic valve. There is mild thickening of the aortic valve. Aortic valve  regurgitation is not visualized. Mild to moderate aortic valve  sclerosis/calcification is present, without any  evidence of aortic stenosis.   7. The inferior vena cava is normal in size with <50% respiratory   variability, suggesting right atrial pressure of 8 mmHg.   Comparison(s): 08/29/20 EF 40-45%. PA pressure . Current TTE shows  LVEF has improved slightly to 45-50% with PASP .  _______________  Echocardiogram 12/25/2020: Impressions: 1. Left ventricular ejection fraction, by estimation, is 55 to 60%. The  left ventricle has normal function. The left ventricle has no regional  wall motion abnormalities. Left ventricular diastolic parameters are  consistent with Grade II diastolic  dysfunction (pseudonormalization). The average left ventricular global  longitudinal strain is -22.0 %. The global longitudinal strain is normal.   2. Right ventricular systolic function is normal. The right ventricular  size is normal.   3. Left atrial size was mild to moderately dilated.   4. Right atrial size was mildly dilated.   5. The mitral valve is normal in structure. Mild mitral valve  regurgitation.   6. The aortic valve is grossly normal. There is mild thickening of the  aortic valve. Aortic valve regurgitation is not visualized. No aortic  stenosis is present.    EKG:  EKG  ordered today.  EKG Interpretation Date/Time:  Tuesday December 20 2022 11:06:36 EDT Ventricular Rate:  59 PR Interval:  150 QRS Duration:  80 QT Interval:  478 QTC Calculation: 473 R Axis:   -16  Text Interpretation: Sinus bradycardia Left ventricular hypertrophy with repolarization abnormality ( R in aVL , Cornell product ) Slight ST depression/ biphasic  T waves in lead I and aVL (possible repolarization changes) Confirmed by Marjie Skiff [phone]) on 12/20/2022 11:18:06 AM    Recent Labs: 09/27/2022: Hemoglobin 8.4  Recent Lipid Panel    Component Value Date/Time   CHOL 194 04/27/2020 1118   TRIG 228 (H) 04/27/2020 1118   HDL 46 04/27/2020 1118   CHOLHDL 4.2 04/27/2020 1118   VLDL 46 (H) 04/27/2020 1118   LDLCALC 102 (H) 04/27/2020 1118    Physical Exam:    Vital Signs: BP 138/76   Pulse (!) 59    Ht 5\' 10"  (1.778 m)   Wt 163 lb 3.2 oz (74 kg)   SpO2 98%   BMI 23.42 kg/m     Wt Readings from Last 3 Encounters:  12/20/22 163 lb 3.2 oz (74 kg)  11/10/22 161 lb (73 kg)  05/10/22 188 lb 0.8 oz (85.3 kg)     General: 62 y.o. Caucasian male in no acute distress. HEENT: Normocephalic and atraumatic. Sclera clear.  Neck: Supple. No JVD. Heart: RRR. Distinct S1 and S2. No murmurs, gallops, or rubs.  Lungs: No increased work of breathing. Clear to ausculation bilaterally. No wheezes, rhonchi, or rales.  Abdomen: Soft, non-distended, and non-tender to palpation.  Extremities: No lower extremity edema.    Skin: Warm and dry. Neuro: Alert and oriented x3. No focal deficits. Psych: Normal affect. Responds appropriately.  Assessment:    1. Pre-op evaluation   2. Dilated cardiomyopathy (HCC)   3. Severe pulmonary hypertension (HCC)   4. Primary hypertension   5. Hyperlipidemia, unspecified hyperlipidemia type   6. Type 2 diabetes mellitus with complication, without long-term current use of insulin (HCC)   7. ESRD (end stage renal disease) (HCC)   8. Nephrotic syndrome     Plan:    Pre-Op Evaluation Patient is going to need AV graft placed for new dialysis access. He is stable from a cardiac standpoint. Activity is limited due to significant peripheral neuropathy which causes balance issues. However, he is able to complete >4.0 METS of physical activity. Therefore, based on ACC/AHA guidelines, patient would be at acceptable risk for the planned procedure without further cardiovascular testing. I will route this recommendation to the requesting party via Epic fax function.   Dilated Cardiomyopathy Severe Pulmonary Hypertension Initially diagnosed in 08/2020 during an admission for severe volume overload secondary to nephrotic syndrome. Echo at that time showed showed LVEF of 40-45% with grade 2 diastolic dysfunction, moderately enlarged RV with moderate RV dysfunction and severely  elevated PASP, moderate MR, trace pericardial effusion, and a large left pleural effusion. He was started on GDMT and EF has normalized. Last Echo in 12/2020 showed LVEF of 55-60% with grade 2 diastolic dysfunction, normal RV, mild MR, and no mention of elevated pulmonary pressures. Cardiomyopathy and pulmonary hypertension were felt to likely be due to volume overload in the setting of his nephrotic syndrome. Given normalization of EF, plan was to hold off on [facility] unless he were to need this for pre-transplant work-up.  - Euvolemic on exam.  - Volume status managed via dialysis. Previously on Torsemide and Metolazone as well but Nephrology stopped this.  - Continue Lopressor 50mg  twice dialy.  - Continue Hydralazine 25mg  three times daily. - Previously on Imdur but this has been stopped since last visit (reportedly by Nephrology).  - No ACEi/ARB/ARNI, MRA, or SGLT2 inhibitor given renal function.   Hypertension BP reasonably well controlled. - Continue current medications: Amlodipine 5mg  daily, Lopressor 50mg  twice dialy, Hydralazine 25mg  three  times daily.  Hyperlipidemia Lipid panel in 02/2022: Total Cholesterol 159, Triglycerides 224, HDL 36, LDL 86.  - Continue Lipitor 20mg  daily.  Type 2 Diabetes Mellitus Hemoglobin A1c 5.8% in 06/2022.  - Previously on Glipizide but this was able to be stopped due to improvement in blood glucose.  - Management per PCP.   ESRD Nephrotic Syndrome On hemodialysis M/W/F. However, he is in need of new dialysis access. Plan is for AV graft.  - Management per Nephrology.  Disposition: Follow up in 6 months.   Medication Adjustments/Labs and Tests Ordered: Current medicines are reviewed at length with the patient today.  Concerns regarding medicines are outlined above.  Orders Placed This Encounter  Procedures   EKG 12-Lead   No orders of the defined types were placed in this encounter.   Patient Instructions  Medication Instructions:   No  changes  These medication were discontinue off your medication list- Imdur,  Gabapentin, Torsemide and Metolazone    *If you need a refill on your cardiac medications before your next appointment, please call your pharmacy*   Lab Work: Not needed      Testing/Procedures: Not needed   Follow-Up: At [facility], you and your health needs are our priority.  As part of our continuing mission to provide you with exceptional heart care, we have created designated Provider Care Teams.  These Care Teams include your primary Cardiologist (physician) and Advanced Practice Providers (APPs -  Physician Assistants and Nurse Practitioners) who all work together to provide you with the care you need, when you need it.  We recommend signing up for the patient portal called "MyChart".  Sign up information is provided on this After Visit Summary.  MyChart is used to connect with patients for Virtual Visits (Telemedicine).  Patients are able to view lab/test results, encounter notes, upcoming appointments, etc.  Non-urgent messages can be sent to your provider as well.   To learn more about what you can do with MyChart, go to [email or web address].    Your next appointment:   6 month(s)  The format for your next appointment:   In Person  Provider:   Bryan Lemma, MD    Other Instructions    You do have cardiac clearance for your upcoming procedure   Signed, Corrin Parker, PA-C  12/20/2022 11:50 AM    [facility]

## 2022-12-09 DIAGNOSIS — N2581 Secondary hyperparathyroidism of renal origin: Secondary | ICD-10-CM | POA: Diagnosis not present

## 2022-12-09 DIAGNOSIS — Z992 Dependence on renal dialysis: Secondary | ICD-10-CM | POA: Diagnosis not present

## 2022-12-09 DIAGNOSIS — N186 End stage renal disease: Secondary | ICD-10-CM | POA: Diagnosis not present

## 2022-12-11 DIAGNOSIS — N186 End stage renal disease: Secondary | ICD-10-CM | POA: Diagnosis not present

## 2022-12-11 DIAGNOSIS — Z992 Dependence on renal dialysis: Secondary | ICD-10-CM | POA: Diagnosis not present

## 2022-12-11 DIAGNOSIS — E1122 Type 2 diabetes mellitus with diabetic chronic kidney disease: Secondary | ICD-10-CM | POA: Diagnosis not present

## 2022-12-12 DIAGNOSIS — N2581 Secondary hyperparathyroidism of renal origin: Secondary | ICD-10-CM | POA: Diagnosis not present

## 2022-12-12 DIAGNOSIS — Z992 Dependence on renal dialysis: Secondary | ICD-10-CM | POA: Diagnosis not present

## 2022-12-12 DIAGNOSIS — N186 End stage renal disease: Secondary | ICD-10-CM | POA: Diagnosis not present

## 2022-12-16 DIAGNOSIS — Z992 Dependence on renal dialysis: Secondary | ICD-10-CM | POA: Diagnosis not present

## 2022-12-16 DIAGNOSIS — N2581 Secondary hyperparathyroidism of renal origin: Secondary | ICD-10-CM | POA: Diagnosis not present

## 2022-12-16 DIAGNOSIS — N186 End stage renal disease: Secondary | ICD-10-CM | POA: Diagnosis not present

## 2022-12-20 ENCOUNTER — Telehealth: Payer: Self-pay

## 2022-12-20 ENCOUNTER — Encounter: Payer: Self-pay | Admitting: Student

## 2022-12-20 ENCOUNTER — Ambulatory Visit: Payer: Medicare HMO | Attending: Student | Admitting: Student

## 2022-12-20 ENCOUNTER — Other Ambulatory Visit: Payer: Self-pay

## 2022-12-20 VITALS — BP 138/76 | HR 59 | Ht 70.0 in | Wt 163.2 lb

## 2022-12-20 DIAGNOSIS — E785 Hyperlipidemia, unspecified: Secondary | ICD-10-CM

## 2022-12-20 DIAGNOSIS — N186 End stage renal disease: Secondary | ICD-10-CM

## 2022-12-20 DIAGNOSIS — I420 Dilated cardiomyopathy: Secondary | ICD-10-CM

## 2022-12-20 DIAGNOSIS — I10 Essential (primary) hypertension: Secondary | ICD-10-CM

## 2022-12-20 DIAGNOSIS — Z01818 Encounter for other preprocedural examination: Secondary | ICD-10-CM | POA: Diagnosis not present

## 2022-12-20 DIAGNOSIS — N049 Nephrotic syndrome with unspecified morphologic changes: Secondary | ICD-10-CM | POA: Diagnosis not present

## 2022-12-20 DIAGNOSIS — I272 Pulmonary hypertension, unspecified: Secondary | ICD-10-CM | POA: Diagnosis not present

## 2022-12-20 DIAGNOSIS — E118 Type 2 diabetes mellitus with unspecified complications: Secondary | ICD-10-CM

## 2022-12-20 DIAGNOSIS — Z992 Dependence on renal dialysis: Secondary | ICD-10-CM

## 2022-12-20 NOTE — Patient Instructions (Signed)
Medication Instructions:   No changes  These medication were discontinue off your medication list- Imdur,  Gabapentin, Torsemide and Metolazone    *If you need a refill on your cardiac medications before your next appointment, please call your pharmacy*   Lab Work: Not needed      Testing/Procedures: Not needed   Follow-Up: At [facility], you and your health needs are our priority.  As part of our continuing mission to provide you with exceptional heart care, we have created designated Provider Care Teams.  These Care Teams include your primary Cardiologist (physician) and Advanced Practice Providers (APPs -  Physician Assistants and Nurse Practitioners) who all work together to provide you with the care you need, when you need it.  We recommend signing up for the patient portal called "MyChart".  Sign up information is provided on this After Visit Summary.  MyChart is used to connect with patients for Virtual Visits (Telemedicine).  Patients are able to view lab/test results, encounter notes, upcoming appointments, etc.  Non-urgent messages can be sent to your provider as well.   To learn more about what you can do with MyChart, go to [email or web address].    Your next appointment:   6 month(s)  The format for your next appointment:   In Person  Provider:   Bryan Lemma, MD    Other Instructions    You do have cardiac clearance for your upcoming procedure

## 2022-12-20 NOTE — Telephone Encounter (Signed)
Patient's [name] returned the call. Rescheduled surgery for 7/16. Instructions reviewed and she voiced understanding.

## 2022-12-20 NOTE — Telephone Encounter (Signed)
Patient has been cleared by cardiology to proceed with AVG placement. Attempted to reach patient. Left VM for patient to return call.

## 2022-12-21 DIAGNOSIS — Z992 Dependence on renal dialysis: Secondary | ICD-10-CM | POA: Diagnosis not present

## 2022-12-21 DIAGNOSIS — N186 End stage renal disease: Secondary | ICD-10-CM | POA: Diagnosis not present

## 2022-12-21 DIAGNOSIS — N2581 Secondary hyperparathyroidism of renal origin: Secondary | ICD-10-CM | POA: Diagnosis not present

## 2022-12-23 DIAGNOSIS — N2581 Secondary hyperparathyroidism of renal origin: Secondary | ICD-10-CM | POA: Diagnosis not present

## 2022-12-23 DIAGNOSIS — Z992 Dependence on renal dialysis: Secondary | ICD-10-CM | POA: Diagnosis not present

## 2022-12-23 DIAGNOSIS — N186 End stage renal disease: Secondary | ICD-10-CM | POA: Diagnosis not present

## 2022-12-26 ENCOUNTER — Encounter (HOSPITAL_COMMUNITY): Payer: Self-pay | Admitting: Vascular Surgery

## 2022-12-26 ENCOUNTER — Other Ambulatory Visit: Payer: Self-pay

## 2022-12-26 DIAGNOSIS — N186 End stage renal disease: Secondary | ICD-10-CM | POA: Diagnosis not present

## 2022-12-26 DIAGNOSIS — N2581 Secondary hyperparathyroidism of renal origin: Secondary | ICD-10-CM | POA: Diagnosis not present

## 2022-12-26 DIAGNOSIS — Z992 Dependence on renal dialysis: Secondary | ICD-10-CM | POA: Diagnosis not present

## 2022-12-26 NOTE — Progress Notes (Addendum)
Mrs Marano called regarding her son, Chioke Noxon, she stated that patient is going to be late getting off of dialysis and that she is happy to talk with me.  I thanked her and said I appreciate it, but I just want to speak with Aneta Mins a minute. Mrs. Balingit said, "you know we are coming no later than 0730- that is what they said." I said that the time has been changed and Mr. Mcnab needs to    be here at 5:30 AM, Mrs Borjon said she cannot have patient here at 5:30 AM , she does not drive in the dark.  I sent a staff message to Luan Moore, whose names was on the time change and gave her the information. I have not received any information from Luan Moore, I checked Dr.Dickson schedule for tomorrow , here and see that Mr. Hosick is Dr. Adele Dan only patient on.  I called the nurse's phone at [facility] and left a message to see if patient can have surgery another day.  Lucianne Lei, RN was able to adjust Dr. Adele Dan surgery's. Patient may come in at 7:00 AM or 7:15 Am.  Mr. Lachapelle has type II diabetes, he is no longer on medications.  Patient checks CBG daily, it runs in the 90's all the time.  I instructed patient to check CBG after awaking and every 2 hours until arrival  to the hospital.  I Instructed patient if CBG is less than 70 to take 4 Glucose Tablets or 1 tube of Glucose Gel or 1/2 cup of a clear juice. Recheck CBG in 15 minutes if CBG is not over 70 call, pre- op desk at [phone] for further instructions.

## 2022-12-27 ENCOUNTER — Ambulatory Visit (HOSPITAL_BASED_OUTPATIENT_CLINIC_OR_DEPARTMENT_OTHER): Payer: Medicare HMO | Admitting: Certified Registered"

## 2022-12-27 ENCOUNTER — Other Ambulatory Visit: Payer: Self-pay

## 2022-12-27 ENCOUNTER — Encounter (HOSPITAL_COMMUNITY): Payer: Self-pay | Admitting: Vascular Surgery

## 2022-12-27 ENCOUNTER — Ambulatory Visit (HOSPITAL_COMMUNITY): Payer: Medicare HMO | Admitting: Certified Registered"

## 2022-12-27 ENCOUNTER — Ambulatory Visit (HOSPITAL_COMMUNITY): Payer: Medicare HMO

## 2022-12-27 ENCOUNTER — Ambulatory Visit (HOSPITAL_COMMUNITY)
Admission: RE | Admit: 2022-12-27 | Discharge: 2022-12-27 | Disposition: A | Discharge status: Home / Self Care | Payer: Medicare HMO | Attending: Vascular Surgery | Admitting: Vascular Surgery

## 2022-12-27 ENCOUNTER — Encounter (HOSPITAL_COMMUNITY)
Admission: RE | Disposition: A | Discharge status: Home / Self Care | Payer: Self-pay | Source: Home / Self Care | Attending: Vascular Surgery

## 2022-12-27 DIAGNOSIS — N186 End stage renal disease: Secondary | ICD-10-CM | POA: Diagnosis not present

## 2022-12-27 DIAGNOSIS — E1122 Type 2 diabetes mellitus with diabetic chronic kidney disease: Secondary | ICD-10-CM | POA: Diagnosis not present

## 2022-12-27 DIAGNOSIS — N185 Chronic kidney disease, stage 5: Secondary | ICD-10-CM

## 2022-12-27 DIAGNOSIS — Z992 Dependence on renal dialysis: Secondary | ICD-10-CM

## 2022-12-27 DIAGNOSIS — E785 Hyperlipidemia, unspecified: Secondary | ICD-10-CM | POA: Diagnosis not present

## 2022-12-27 DIAGNOSIS — E1151 Type 2 diabetes mellitus with diabetic peripheral angiopathy without gangrene: Secondary | ICD-10-CM | POA: Diagnosis not present

## 2022-12-27 DIAGNOSIS — Z452 Encounter for adjustment and management of vascular access device: Secondary | ICD-10-CM | POA: Diagnosis not present

## 2022-12-27 DIAGNOSIS — I5043 Acute on chronic combined systolic (congestive) and diastolic (congestive) heart failure: Secondary | ICD-10-CM

## 2022-12-27 DIAGNOSIS — I132 Hypertensive heart and chronic kidney disease with heart failure and with stage 5 chronic kidney disease, or end stage renal disease: Secondary | ICD-10-CM

## 2022-12-27 DIAGNOSIS — I120 Hypertensive chronic kidney disease with stage 5 chronic kidney disease or end stage renal disease: Secondary | ICD-10-CM | POA: Diagnosis not present

## 2022-12-27 HISTORY — PX: AV FISTULA PLACEMENT: SHX1204

## 2022-12-27 HISTORY — DX: Anemia, unspecified: D64.9

## 2022-12-27 HISTORY — PX: INSERTION OF DIALYSIS CATHETER: SHX1324

## 2022-12-27 LAB — POCT I-STAT, CHEM 8
BUN: 11 mg/dL (ref 8–23)
Calcium, Ion: 1.09 mmol/L — ABNORMAL LOW (ref 1.15–1.40)
Chloride: 103 mmol/L (ref 98–111)
Creatinine, Ser: 4.4 mg/dL — ABNORMAL HIGH (ref 0.61–1.24)
Glucose, Bld: 104 mg/dL — ABNORMAL HIGH (ref 70–99)
HCT: 28 % — ABNORMAL LOW (ref 39.0–52.0)
Hemoglobin: 9.5 g/dL — ABNORMAL LOW (ref 13.0–17.0)
Potassium: 3.3 mmol/L — ABNORMAL LOW (ref 3.5–5.1)
Sodium: 139 mmol/L (ref 135–145)
TCO2: 28 mmol/L (ref 22–32)

## 2022-12-27 LAB — GLUCOSE, CAPILLARY
Glucose-Capillary: 104 mg/dL — ABNORMAL HIGH (ref 70–99)
Glucose-Capillary: 134 mg/dL — ABNORMAL HIGH (ref 70–99)

## 2022-12-27 SURGERY — INSERTION OF ARTERIOVENOUS (AV) GORE-TEX GRAFT ARM
Anesthesia: General | Site: Chest | Laterality: Right

## 2022-12-27 MED ORDER — ONDANSETRON HCL 4 MG/2ML IJ SOLN
INTRAMUSCULAR | Status: AC
  Filled 2022-12-27: qty 2

## 2022-12-27 MED ORDER — HEPARIN 6000 UNIT IRRIGATION SOLUTION
Status: DC | PRN
  Administered 2022-12-27: 1

## 2022-12-27 MED ORDER — HEPARIN 6000 UNIT IRRIGATION SOLUTION
Status: AC
  Filled 2022-12-27: qty 500

## 2022-12-27 MED ORDER — HEPARIN SODIUM (PORCINE) 1000 UNIT/ML IJ SOLN
INTRAMUSCULAR | Status: DC | PRN
  Administered 2022-12-27: 7000 [IU] via INTRAVENOUS

## 2022-12-27 MED ORDER — LIDOCAINE 2% (20 MG/ML) 5 ML SYRINGE
INTRAMUSCULAR | Status: AC
  Filled 2022-12-27: qty 5

## 2022-12-27 MED ORDER — OXYCODONE HCL 5 MG PO TABS: 5.0000 mg | ORAL_TABLET | Freq: Once | ORAL | Status: DC | PRN

## 2022-12-27 MED ORDER — HEMOSTATIC AGENTS (NO CHARGE) OPTIME
TOPICAL | Status: DC | PRN
  Administered 2022-12-27: 1 via TOPICAL

## 2022-12-27 MED ORDER — CHLORHEXIDINE GLUCONATE 4 % EX SOLN: 60.0000 mL | Freq: Once | CUTANEOUS | Status: DC

## 2022-12-27 MED ORDER — HEPARIN SODIUM (PORCINE) 1000 UNIT/ML IJ SOLN
INTRAMUSCULAR | Status: AC
  Filled 2022-12-27: qty 10

## 2022-12-27 MED ORDER — MIDAZOLAM HCL 2 MG/2ML IJ SOLN: 0.5000 mg | Freq: Once | INTRAMUSCULAR | Status: DC | PRN

## 2022-12-27 MED ORDER — DEXAMETHASONE SODIUM PHOSPHATE 10 MG/ML IJ SOLN
INTRAMUSCULAR | Status: AC
  Filled 2022-12-27: qty 1

## 2022-12-27 MED ORDER — LIDOCAINE HCL (PF) 1 % IJ SOLN
INTRAMUSCULAR | Status: DC | PRN
  Administered 2022-12-27: 10 mL

## 2022-12-27 MED ORDER — PROPOFOL 10 MG/ML IV BOLUS
INTRAVENOUS | Status: DC | PRN
  Administered 2022-12-27: 80 mg via INTRAVENOUS

## 2022-12-27 MED ORDER — FENTANYL CITRATE (PF) 100 MCG/2ML IJ SOLN
INTRAMUSCULAR | Status: AC
  Filled 2022-12-27: qty 2

## 2022-12-27 MED ORDER — LIDOCAINE 2% (20 MG/ML) 5 ML SYRINGE
INTRAMUSCULAR | Status: DC | PRN
  Administered 2022-12-27: 20 mg via INTRAVENOUS

## 2022-12-27 MED ORDER — MEPERIDINE HCL 25 MG/ML IJ SOLN: 6.2500 mg | INTRAMUSCULAR | Status: DC | PRN

## 2022-12-27 MED ORDER — FENTANYL CITRATE (PF) 100 MCG/2ML IJ SOLN: 25.0000 ug | INTRAMUSCULAR | Status: DC | PRN

## 2022-12-27 MED ORDER — ACETAMINOPHEN 500 MG PO TABS
1000.0000 mg | ORAL_TABLET | Freq: Once | ORAL | Status: AC
  Administered 2022-12-27: 1000 mg via ORAL

## 2022-12-27 MED ORDER — LIDOCAINE-EPINEPHRINE (PF) 1 %-1:200000 IJ SOLN
INTRAMUSCULAR | Status: DC | PRN
  Administered 2022-12-27: 30 mL

## 2022-12-27 MED ORDER — 0.9 % SODIUM CHLORIDE (POUR BTL) OPTIME
TOPICAL | Status: DC | PRN
  Administered 2022-12-27: 1000 mL

## 2022-12-27 MED ORDER — CEFAZOLIN SODIUM-DEXTROSE 2-3 GM-%(50ML) IV SOLR
INTRAVENOUS | Status: DC | PRN
  Administered 2022-12-27: 2 g via INTRAVENOUS

## 2022-12-27 MED ORDER — MIDAZOLAM HCL 2 MG/2ML IJ SOLN
INTRAMUSCULAR | Status: AC
  Filled 2022-12-27: qty 2

## 2022-12-27 MED ORDER — OXYCODONE HCL 5 MG/5ML PO SOLN: 5.0000 mg | Freq: Once | ORAL | Status: DC | PRN

## 2022-12-27 MED ORDER — HEPARIN SODIUM (PORCINE) 1000 UNIT/ML IJ SOLN
INTRAMUSCULAR | Status: DC | PRN
  Administered 2022-12-27: 3.8 [IU] via INTRAVENOUS

## 2022-12-27 MED ORDER — LIDOCAINE-EPINEPHRINE (PF) 1 %-1:200000 IJ SOLN
INTRAMUSCULAR | Status: AC
  Filled 2022-12-27: qty 30

## 2022-12-27 MED ORDER — PROMETHAZINE HCL 25 MG/ML IJ SOLN: 6.2500 mg | INTRAMUSCULAR | Status: DC | PRN

## 2022-12-27 MED ORDER — PHENYLEPHRINE 80 MCG/ML (10ML) SYRINGE FOR IV PUSH (FOR BLOOD PRESSURE SUPPORT)
PREFILLED_SYRINGE | INTRAVENOUS | Status: DC | PRN
  Administered 2022-12-27: 80 ug via INTRAVENOUS

## 2022-12-27 MED ORDER — PROTAMINE SULFATE 10 MG/ML IV SOLN
INTRAVENOUS | Status: DC | PRN
  Administered 2022-12-27 (×2): 20 mg via INTRAVENOUS

## 2022-12-27 MED ORDER — LIDOCAINE HCL (PF) 1 % IJ SOLN
INTRAMUSCULAR | Status: AC
  Filled 2022-12-27: qty 30

## 2022-12-27 MED ORDER — FENTANYL CITRATE (PF) 250 MCG/5ML IJ SOLN
INTRAMUSCULAR | Status: AC
  Filled 2022-12-27: qty 5

## 2022-12-27 MED ORDER — HYDROCODONE-ACETAMINOPHEN 5-325 MG PO TABS: 1.0000 | ORAL_TABLET | Freq: Four times a day (QID) | ORAL | 0 refills | Status: DC | PRN

## 2022-12-27 MED ORDER — SODIUM CHLORIDE 0.9 % IV SOLN: INTRAVENOUS | Status: DC

## 2022-12-27 MED ORDER — FENTANYL CITRATE (PF) 250 MCG/5ML IJ SOLN
INTRAMUSCULAR | Status: DC | PRN
  Administered 2022-12-27: 100 ug via INTRAVENOUS

## 2022-12-27 MED ORDER — PROTAMINE SULFATE 10 MG/ML IV SOLN
INTRAVENOUS | Status: AC
  Filled 2022-12-27: qty 25

## 2022-12-27 MED ORDER — SUGAMMADEX SODIUM 200 MG/2ML IV SOLN
INTRAVENOUS | Status: DC | PRN
  Administered 2022-12-27: 156 mg via INTRAVENOUS

## 2022-12-27 MED ORDER — INSULIN ASPART 100 UNIT/ML IJ SOLN: 0.0000 [IU] | INTRAMUSCULAR | Status: DC | PRN

## 2022-12-27 MED ORDER — CEFAZOLIN SODIUM-DEXTROSE 2-4 GM/100ML-% IV SOLN
2.0000 g | INTRAVENOUS | Status: DC
  Filled 2022-12-27: qty 100

## 2022-12-27 MED ORDER — PHENYLEPHRINE HCL-NACL 20-0.9 MG/250ML-% IV SOLN
INTRAVENOUS | Status: DC | PRN
  Administered 2022-12-27: 25 ug/min via INTRAVENOUS

## 2022-12-27 MED ORDER — CHLORHEXIDINE GLUCONATE 0.12 % MT SOLN
OROMUCOSAL | Status: AC
  Administered 2022-12-27: 15 mL
  Filled 2022-12-27: qty 15

## 2022-12-27 MED ORDER — ROCURONIUM BROMIDE 10 MG/ML (PF) SYRINGE
PREFILLED_SYRINGE | INTRAVENOUS | Status: DC | PRN
  Administered 2022-12-27: 50 mg via INTRAVENOUS

## 2022-12-27 MED ORDER — ROCURONIUM BROMIDE 10 MG/ML (PF) SYRINGE
PREFILLED_SYRINGE | INTRAVENOUS | Status: AC
  Filled 2022-12-27: qty 10

## 2022-12-27 SURGICAL SUPPLY — 64 items
ADH SKN CLS APL DERMABOND .7 (GAUZE/BANDAGES/DRESSINGS) ×4
AGENT HMST 10 BLLW SHRT CANN (HEMOSTASIS) ×2
APL PRP STRL LF DISP 70% ISPRP (MISCELLANEOUS) ×2
ARMBAND PINK RESTRICT EXTREMIT (MISCELLANEOUS) ×4 IMPLANT
BAG COUNTER SPONGE SURGICOUNT (BAG) ×4 IMPLANT
BAG DECANTER FOR FLEXI CONT (MISCELLANEOUS) ×2 IMPLANT
BAG SPNG CNTER NS LX DISP (BAG) ×4
BIOPATCH RED 1 DISK 7.0 (GAUZE/BANDAGES/DRESSINGS) ×2 IMPLANT
CANISTER SUCT 3000ML PPV (MISCELLANEOUS) ×2 IMPLANT
CANNULA VESSEL 3MM 2 BLNT TIP (CANNULA) ×2 IMPLANT
CATH PALINDROME-P 19CM W/VT (CATHETERS) IMPLANT
CATH PALINDROME-P 23CM W/VT (CATHETERS) IMPLANT
CATH PALINDROME-P 28CM W/VT (CATHETERS) IMPLANT
CHLORAPREP W/TINT 26 (MISCELLANEOUS) ×2 IMPLANT
CLIP TI MEDIUM 6 (CLIP) ×2 IMPLANT
CLIP TI WIDE RED SMALL 6 (CLIP) ×2 IMPLANT
COVER PROBE W GEL 5X96 (DRAPES) IMPLANT
COVER SURGICAL LIGHT HANDLE (MISCELLANEOUS) ×2 IMPLANT
DERMABOND ADVANCED .7 DNX12 (GAUZE/BANDAGES/DRESSINGS) ×2 IMPLANT
DRAPE C-ARM 42X72 X-RAY (DRAPES) ×2 IMPLANT
DRAPE CHEST BREAST 15X10 FENES (DRAPES) ×2 IMPLANT
ELECT REM PT RETURN 9FT ADLT (ELECTROSURGICAL) ×2
ELECTRODE REM PT RTRN 9FT ADLT (ELECTROSURGICAL) ×2 IMPLANT
GAUZE 4X4 16PLY ~~LOC~~+RFID DBL (SPONGE) ×2 IMPLANT
GLOVE BIO SURGEON STRL SZ7.5 (GLOVE) ×4 IMPLANT
GLOVE BIOGEL PI IND STRL 7.0 (GLOVE) IMPLANT
GLOVE BIOGEL PI IND STRL 8 (GLOVE) ×4 IMPLANT
GLOVE SURG POLY ORTHO LF SZ7.5 (GLOVE) IMPLANT
GLOVE SURG UNDER LTX SZ8 (GLOVE) ×4 IMPLANT
GOWN STRL REUS W/ TWL LRG LVL3 (GOWN DISPOSABLE) ×10 IMPLANT
GOWN STRL REUS W/TWL LRG LVL3 (GOWN DISPOSABLE) ×10
GRAFT GORETEX STRT 4-7X45 (Vascular Products) IMPLANT
HEMOSTAT HEMOBLAST BELLOWS (HEMOSTASIS) IMPLANT
KIT BASIN OR (CUSTOM PROCEDURE TRAY) ×4 IMPLANT
KIT PALINDROME-P 55CM (CATHETERS) IMPLANT
KIT TURNOVER KIT B (KITS) ×4 IMPLANT
NDL 18GX1X1/2 (RX/OR ONLY) (NEEDLE) ×2 IMPLANT
NDL HYPO 25GX1X1/2 BEV (NEEDLE) ×2 IMPLANT
NEEDLE 18GX1X1/2 (RX/OR ONLY) (NEEDLE) ×2 IMPLANT
NEEDLE HYPO 25GX1X1/2 BEV (NEEDLE) ×2 IMPLANT
NS IRRIG 1000ML POUR BTL (IV SOLUTION) ×4 IMPLANT
PACK BASIC III (CUSTOM PROCEDURE TRAY) ×2
PACK CV ACCESS (CUSTOM PROCEDURE TRAY) ×2 IMPLANT
PACK SRG BSC III STRL LF ECLPS (CUSTOM PROCEDURE TRAY) ×2 IMPLANT
PAD ARMBOARD 7.5X6 YLW CONV (MISCELLANEOUS) ×8 IMPLANT
SET MICROPUNCTURE 5F STIFF (MISCELLANEOUS) IMPLANT
SLING ARM FOAM STRAP LRG (SOFTGOODS) IMPLANT
SLING ARM FOAM STRAP MED (SOFTGOODS) IMPLANT
SPIKE FLUID TRANSFER (MISCELLANEOUS) ×2 IMPLANT
SPONGE SURGIFOAM ABS GEL 100 (HEMOSTASIS) IMPLANT
SUT ETHILON 3 0 PS 1 (SUTURE) ×2 IMPLANT
SUT MNCRL AB 4-0 PS2 18 (SUTURE) ×6 IMPLANT
SUT PROLENE 6 0 BV (SUTURE) ×4 IMPLANT
SUT VIC AB 3-0 SH 27 (SUTURE) ×4
SUT VIC AB 3-0 SH 27X BRD (SUTURE) ×4 IMPLANT
SYR 10ML LL (SYRINGE) ×2 IMPLANT
SYR 20ML LL LF (SYRINGE) ×4 IMPLANT
SYR 5ML LL (SYRINGE) ×4 IMPLANT
SYR CONTROL 10ML LL (SYRINGE) ×2 IMPLANT
SYR TOOMEY 50ML (SYRINGE) IMPLANT
TOWEL GREEN STERILE (TOWEL DISPOSABLE) ×6 IMPLANT
TOWEL GREEN STERILE FF (TOWEL DISPOSABLE) ×2 IMPLANT
UNDERPAD 30X36 HEAVY ABSORB (UNDERPADS AND DIAPERS) ×2 IMPLANT
WATER STERILE IRR 1000ML POUR (IV SOLUTION) ×4 IMPLANT

## 2022-12-27 NOTE — Discharge Instructions (Signed)

## 2022-12-27 NOTE — H&P (Signed)
ASSESSMENT & PLAN   END-STAGE RENAL DISEASE: This patient was referred for evaluation for an AV fistula or graft.  Based on his vein map he does not appear to be a candidate for a fistula although I will look at the veins myself at the time of surgery.  He will more likely require placement of an AV graft.  He is right-handed so we will start on the left side.  He has a weak radial pulse however he has multiphasic signals in both radial and ulnar positions bilaterally.   I have explained the indications for placement of an AV fistula or AV graft. I've explained that if at all possible we will place an AV fistula.  I have reviewed the risks of placement of an AV fistula including but not limited to: failure of the fistula to mature, need for subsequent interventions, and thrombosis. In addition I have reviewed the potential complications of placement of an AV graft. These risks include, but are not limited to, graft thrombosis, graft infection, wound healing problems, bleeding, arm swelling, and steal syndrome. All the patient's questions were answered and they are agreeable to proceed with surgery.  REASON FOR VISIT:    For hemodialysis access.  HPI:   [name] [name] is a 62 y.o. male who was referred for evaluation for hemodialysis access.  I have reviewed the records from the referring office.  We were asked to place new access.  He has a functioning catheter.  He dialyzes on Monday Wednesdays and Fridays.   He denies any recent uremic symptoms.  He does not have a pacemaker.  He has not had previous access in his arms.  He does have a functioning right IJ tunneled dialysis catheter.   He is not on any blood thinners.  Past Medical History:  Diagnosis Date   Anemia    Arthritis    Cardiomyopathy [facility])    July 2022   Diabetes mellitus (HCC)    GERD (gastroesophageal reflux disease)    HTN (hypertension)    Hyperlipidemia    Nephrotic syndrome    Obesity    Pulmonary hypertension  (HCC)    July 2022 in setting of nephrotic syndrome   Secondary hyperparathyroidism of renal origin (HCC)     Family History  Problem Relation Age of Onset   Hypertension Mother    Diabetes Mother    Cancer Father        prostate cancer   Hypertension Maternal Grandfather    Rheum arthritis Maternal Grandmother    CAD Neg Hx     SOCIAL HISTORY: Social History   Tobacco Use   Smoking status: Never   Smokeless tobacco: Never  Substance Use Topics   Alcohol use: Never    No Known Allergies  Current Facility-Administered Medications  Medication Dose Route Frequency Provider Last Rate Last Admin   0.9 %  sodium chloride infusion   Intravenous Continuous Chuck Hint, MD 10 mL/hr at 12/27/22 0822 New Bag at 12/27/22 0822   ceFAZolin (ANCEF) IVPB 2g/100 mL premix  2 g Intravenous 30 min Pre-Op Chuck Hint, MD       chlorhexidine (HIBICLENS) 4 % liquid 4 Application  60 mL Topical Once Chuck Hint, MD       And   [START ON 12/28/2022] chlorhexidine (HIBICLENS) 4 % liquid 4 Application  60 mL Topical Once Chuck Hint, MD       fentaNYL (SUBLIMAZE) 100 MCG/2ML injection  insulin aspart (novoLOG) injection 0-7 Units  0-7 Units Subcutaneous Q2H PRN Jairo Ben, MD       midazolam (VERSED) 2 MG/2ML injection             REVIEW OF SYSTEMS:  [X]  denotes positive finding, [ ]  denotes negative finding Cardiac  Comments:  Chest pain or chest pressure:    Shortness of breath upon exertion:    Short of breath when lying flat:    Irregular heart rhythm:        Vascular    Pain in calf, thigh, or hip brought on by ambulation:    Pain in feet at night that wakes you up from your sleep:  x   Blood clot in your veins:    Leg swelling:         Pulmonary    Oxygen at home:    Productive cough:     Wheezing:         Neurologic    Sudden weakness in arms or legs:     Sudden numbness in arms or legs:     Sudden onset of  difficulty speaking or slurred speech:    Temporary loss of vision in one eye:     Problems with dizziness:         Gastrointestinal    Blood in stool:     Vomited blood:         Genitourinary    Burning when urinating:     Blood in urine:        Psychiatric    Major depression:         Hematologic    Bleeding problems:    Problems with blood clotting too easily:        Skin    Rashes or ulcers:        Constitutional    Fever or chills:    -  PHYSICAL EXAM:   Vitals:   12/27/22 0756 12/27/22 0833  BP: (!) 184/84 (!) 174/73  Pulse: 66   Resp: 17   Temp: 98.1 F (36.7 C)   TempSrc: Oral   SpO2: 97%   Weight: 78 kg   Height: 5\' 10"  (1.778 m)    Body mass index is 24.68 kg/m. GENERAL: The patient is a well-nourished male, in no acute distress. The vital signs are documented above. CARDIAC: There is a regular rate and rhythm.  VASCULAR: He has diminished radial pulses. PULMONARY: There is good air exchange bilaterally without wheezing or rales. ABDOMEN: Soft and non-tender with normal pitched bowel sounds.  MUSCULOSKELETAL: There are no major deformities. NEUROLOGIC: No focal weakness or paresthesias are detected. SKIN: There are no ulcers or rashes noted. PSYCHIATRIC: The patient has a normal affect.  DATA:    UPPER EXTREMITY VEIN MAP: I have independently interpreted his upper extremity vein map.   On the right side he does not appear to have adequate vein for a fistula.   On the left side he does not appear to have adequate vein for a fistula.   UPPER EXTREMITY ARTERIAL DUPLEX: I have independently interpreted his upper extremity arterial duplex scan.   On the right side he has a biphasic radial signal with a triphasic ulnar signal.  The brachial artery measures 5.6 mm in diameter.   On the left side he has a biphasic radial signal and a triphasic ulnar signal.  The brachial artery measures 4.5 mm in diameter.  [facility]

## 2022-12-27 NOTE — Op Note (Signed)
NAME: [name] [name]    MRN: [id number] DOB: 1960-10-28    DATE OF OPERATION: 12/27/2022  PREOP DIAGNOSIS:    End-stage renal disease  POSTOP DIAGNOSIS:    Same  PROCEDURE:    Ultrasound-guided placement of right IJ tunneled dialysis catheter New left upper arm AV graft (4-7 mm PTFE graft)  SURGEON: Di Kindle. Edilia Bo, MD  ASSIST: Aggie Moats, PA  ANESTHESIA: General  EBL: Minimal  INDICATIONS:    [name] [name] is a 62 y.o. male who presents for new access.  He was not a candidate for fistula based on his vein map.  FINDINGS:   Good thrill at the completion of the procedure.  Radial and ulnar signal with a Doppler also a palmar arch signal with Doppler.  TECHNIQUE:   The patient was taken to the operating room and received a general anesthetic.  The neck and upper chest were prepped and draped in usual sterile fashion.  The skin was infiltrated with 1% lidocaine with epinephrine over the entrance site to the IJ.  A small transverse incision was made here and the catheter was dissected free.  It was clamped and divided.  I then selected a 23 cm catheter and this was tunneled away from the previous catheter to the IJ position.  Next a guidewire was introduced to the old catheter and the old catheter removed.  The dilator and peel-away sheath were then advanced over the wire and the wire and dilator removed.  The new catheter was passed through the peel-away sheath and positioned in the right atrium.  Both ports withdrew easily with and flushed with heparinized saline and filled with concentrated heparin.  The old catheter was removed after the cuff was dissected free.  Catheter was secured with a 3-0 nylon suture.  The IJ cannulation site was closed with a 4-0 Monocryl.  A sterile dressing was applied.  Attention was turned to the left arm.  The left arm was prepped and draped in usual sterile fashion.  A longitudinal incision was made over the brachial artery just above the  antecubital level here the artery was dissected free and controlled with a vessel loop.  A separate longitudinal incision was made beneath the axilla after the skin was anesthetized.  The high brachial vein was dissected free.  A 4-7 mm PTFE graft was then tunneled between the 2 incisions and the patient was heparinized.  The brachial artery was clamped proximally and distally and a longitudinal arteriotomy was made.  A short segment of the 4 mm end of the graft was excised, the graft slightly spatulated and sewn end-to-side to the artery using continuous 6-0 Prolene suture.  The graft then pulled the appropriate length for anastomosis to the high brachial vein.  The vein was ligated distally and spatulated proximally.  The graft was cut to the appropriate length spatulated and sewn into into the vein using two 6-0 Prolene sutures.  At the completion there was an excellent thrill in the fistula.  There was a radial and ulnar signal with Doppler.  There was a palmar arch signal with Doppler.  Hemostasis was obtained of the wounds.  Each of the wounds was closed with 2 deep layers of 3-0 Vicryl and the skin closed with 4-0 Monocryl.  Dermabond was applied.  The patient tolerated the procedure well was transferred recovery room in stable condition.  All needle and sponge counts were correct.  Given the complexity of the case,  the assistant was necessary in  order to expedient the procedure and safely perform the technical aspects of the operation.  The assistant provided traction and countertraction to assist with exposure of the artery and vein.  They also assisted with suture ligation of multiple venous branches. They also assisted with tunneling of the graft.  They played a critical role for both anastomoses.. These skills, especially following the Prolene suture for the anastomosis, could not have been adequately performed by a scrub tech assistant.    [name] Ferrari, MD, FACS [facility]  DATE OF DICTATION:   12/27/2022

## 2022-12-27 NOTE — Anesthesia Preprocedure Evaluation (Addendum)
Anesthesia Evaluation  Patient identified by MRN, date of birth, ID band Patient awake    Reviewed: Allergy & Precautions, NPO status , Patient's Chart, lab work & pertinent test results  History of Anesthesia Complications Negative for: history of anesthetic complications  Airway Mallampati: II  TM Distance: >3 FB Neck ROM: Full    Dental  (+) Missing, Chipped, Dental Advisory Given   Pulmonary neg pulmonary ROS   breath sounds clear to auscultation       Cardiovascular hypertension, Pt. on medications and Pt. on home beta blockers pulmonary hypertension(-) angina + Peripheral Vascular Disease   Rhythm:Regular Rate:Normal  '22 ECHO: EF 55-60%.  1. The LV has normal function, no regional wall motion abnormalities. Grade II diastolic  dysfunction (pseudonormalization).    2. RVF is normal. The right ventricular size is normal.   3. Left atrial size was mild to moderately dilated.   4. Right atrial size was mildly dilated.   5. The mitral valve is normal in structure. Mild MR.   6. The aortic valve is grossly normal. There is mild thickening of the aortic valve. Aortic valve regurgitation is not visualized. No AS.     Neuro/Psych negative neurological ROS     GI/Hepatic Neg liver ROS,GERD  Medicated and Controlled,,  Endo/Other  diabetes (glu 104glu 104)    Renal/GU ESRF and DialysisRenal disease (MWF, K+ 3.3)     Musculoskeletal   Abdominal   Peds  Hematology  (+) Blood dyscrasia (Hb 9.5), anemia   Anesthesia Other Findings   Reproductive/Obstetrics                             Anesthesia Physical Anesthesia Plan  ASA: 3  Anesthesia Plan: General   Post-op Pain Management: Tylenol PO (pre-op)*   Induction: Intravenous  PONV Risk Score and Plan: 2 and Ondansetron and Dexamethasone  Airway Management Planned: LMA  Additional Equipment: None  Intra-op Plan:   Post-operative  Plan:   Informed Consent: I have reviewed the patients History and Physical, chart, labs and discussed the procedure including the risks, benefits and alternatives for the proposed anesthesia with the patient or authorized representative who has indicated his/her understanding and acceptance.     Dental advisory given  Plan Discussed with: CRNA and Surgeon  Anesthesia Plan Comments:         Anesthesia Quick Evaluation

## 2022-12-27 NOTE — Transfer of Care (Signed)
Immediate Anesthesia Transfer of Care Note  Patient: [name] [name]  Procedure(s) Performed: INSERTION OF LEFT ARM ARTERIOVENOUS (AV) GORE-TEX GRAFT (Left: Arm Upper) INSERTION OF Right Internal Jugular  DIALYSIS CATHETER (Right: Chest)  Patient Location: PACU  Anesthesia Type:General  Level of Consciousness: drowsy, patient cooperative, and responds to stimulation  Airway & Oxygen Therapy: Patient Spontanous Breathing  Post-op Assessment: Report given to RN, Post -op Vital signs reviewed and stable, and Patient moving all extremities X 4  Post vital signs: Reviewed and stable  Last Vitals:  Vitals Value Taken Time  BP 144/64 12/27/22 1154  Temp    Pulse 56 12/27/22 1158  Resp 14 12/27/22 1158  SpO2 91 % 12/27/22 1158  Vitals shown include unfiled device data.  Last Pain:  Vitals:   12/27/22 0820  TempSrc:   PainSc: 0-No pain         Complications: No notable events documented.

## 2022-12-27 NOTE — Anesthesia Procedure Notes (Signed)
Procedure Name: Intubation Date/Time: 12/27/2022 9:30 AM  Performed by: Aundria Rud, CRNAPre-anesthesia Checklist: Patient identified, Emergency Drugs available, Suction available and Patient being monitored Patient Re-evaluated:Patient Re-evaluated prior to induction Oxygen Delivery Method: Circle System Utilized Preoxygenation: Pre-oxygenation with 100% oxygen Induction Type: IV induction Ventilation: Mask ventilation without difficulty and Oral airway inserted - appropriate to patient size Laryngoscope Size: Mac and 4 Grade View: Grade I Tube type: Oral Tube size: 8.0 mm Number of attempts: 1 Airway Equipment and Method: Stylet and Oral airway Placement Confirmation: ETT inserted through vocal cords under direct vision, positive ETCO2 and breath sounds checked- equal and bilateral Secured at: 21 cm Tube secured with: Tape Dental Injury: Teeth and Oropharynx as per pre-operative assessment

## 2022-12-27 NOTE — Anesthesia Postprocedure Evaluation (Signed)
Anesthesia Post Note  Patient: [name] [name]  Procedure(s) Performed: INSERTION OF LEFT ARM ARTERIOVENOUS (AV) GORE-TEX GRAFT (Left: Arm Upper) INSERTION OF Right Internal Jugular  DIALYSIS CATHETER (Right: Chest)     Patient location during evaluation: PACU Anesthesia Type: General Level of consciousness: awake and alert, patient cooperative and oriented Pain management: pain level controlled Vital Signs Assessment: post-procedure vital signs reviewed and stable Respiratory status: spontaneous breathing, nonlabored ventilation and respiratory function stable Cardiovascular status: blood pressure returned to baseline and stable Postop Assessment: no apparent nausea or vomiting Anesthetic complications: no   No notable events documented.  Last Vitals:  Vitals:   12/27/22 1155 12/27/22 1200  BP: (!) 144/64   Pulse: (!) 55 (!) 56  Resp: (!) 9 (!) 8  Temp: 36.4 C   SpO2: 95% 91%    Last Pain:  Vitals:   12/27/22 1155  TempSrc:   PainSc: Asleep                 Sostenes Kauffmann,E. Dreyton Roessner

## 2022-12-28 DIAGNOSIS — N2581 Secondary hyperparathyroidism of renal origin: Secondary | ICD-10-CM | POA: Diagnosis not present

## 2022-12-28 DIAGNOSIS — N186 End stage renal disease: Secondary | ICD-10-CM | POA: Diagnosis not present

## 2022-12-28 DIAGNOSIS — Z992 Dependence on renal dialysis: Secondary | ICD-10-CM | POA: Diagnosis not present

## 2022-12-30 ENCOUNTER — Encounter (HOSPITAL_COMMUNITY): Payer: Self-pay | Admitting: Vascular Surgery

## 2022-12-30 DIAGNOSIS — N186 End stage renal disease: Secondary | ICD-10-CM | POA: Diagnosis not present

## 2022-12-30 DIAGNOSIS — Z992 Dependence on renal dialysis: Secondary | ICD-10-CM | POA: Diagnosis not present

## 2022-12-30 DIAGNOSIS — N2581 Secondary hyperparathyroidism of renal origin: Secondary | ICD-10-CM | POA: Diagnosis not present

## 2023-01-02 DIAGNOSIS — N186 End stage renal disease: Secondary | ICD-10-CM | POA: Diagnosis not present

## 2023-01-02 DIAGNOSIS — N2581 Secondary hyperparathyroidism of renal origin: Secondary | ICD-10-CM | POA: Diagnosis not present

## 2023-01-02 DIAGNOSIS — Z992 Dependence on renal dialysis: Secondary | ICD-10-CM | POA: Diagnosis not present

## 2023-01-09 ENCOUNTER — Emergency Department (HOSPITAL_COMMUNITY)
Admission: EM | Admit: 2023-01-09 | Discharge: 2023-01-09 | Disposition: A | Discharge status: Home / Self Care | Payer: Medicare HMO | Source: Home / Self Care | Attending: Emergency Medicine | Admitting: Emergency Medicine

## 2023-01-09 ENCOUNTER — Encounter (HOSPITAL_COMMUNITY): Payer: Self-pay

## 2023-01-09 ENCOUNTER — Emergency Department (HOSPITAL_COMMUNITY): Payer: Medicare HMO

## 2023-01-09 ENCOUNTER — Other Ambulatory Visit: Payer: Self-pay

## 2023-01-09 DIAGNOSIS — K5120 Ulcerative (chronic) proctitis without complications: Secondary | ICD-10-CM | POA: Diagnosis present

## 2023-01-09 DIAGNOSIS — N186 End stage renal disease: Secondary | ICD-10-CM | POA: Insufficient documentation

## 2023-01-09 DIAGNOSIS — I272 Pulmonary hypertension, unspecified: Secondary | ICD-10-CM | POA: Diagnosis present

## 2023-01-09 DIAGNOSIS — K921 Melena: Secondary | ICD-10-CM | POA: Diagnosis present

## 2023-01-09 DIAGNOSIS — K219 Gastro-esophageal reflux disease without esophagitis: Secondary | ICD-10-CM | POA: Diagnosis present

## 2023-01-09 DIAGNOSIS — D649 Anemia, unspecified: Secondary | ICD-10-CM | POA: Insufficient documentation

## 2023-01-09 DIAGNOSIS — G8929 Other chronic pain: Secondary | ICD-10-CM | POA: Diagnosis present

## 2023-01-09 DIAGNOSIS — D62 Acute posthemorrhagic anemia: Secondary | ICD-10-CM | POA: Diagnosis present

## 2023-01-09 DIAGNOSIS — I132 Hypertensive heart and chronic kidney disease with heart failure and with stage 5 chronic kidney disease, or end stage renal disease: Secondary | ICD-10-CM | POA: Diagnosis present

## 2023-01-09 DIAGNOSIS — R197 Diarrhea, unspecified: Secondary | ICD-10-CM | POA: Insufficient documentation

## 2023-01-09 DIAGNOSIS — I120 Hypertensive chronic kidney disease with stage 5 chronic kidney disease or end stage renal disease: Secondary | ICD-10-CM | POA: Diagnosis not present

## 2023-01-09 DIAGNOSIS — I509 Heart failure, unspecified: Secondary | ICD-10-CM | POA: Insufficient documentation

## 2023-01-09 DIAGNOSIS — K529 Noninfective gastroenteritis and colitis, unspecified: Secondary | ICD-10-CM | POA: Diagnosis not present

## 2023-01-09 DIAGNOSIS — I10 Essential (primary) hypertension: Secondary | ICD-10-CM | POA: Diagnosis not present

## 2023-01-09 DIAGNOSIS — E876 Hypokalemia: Secondary | ICD-10-CM | POA: Diagnosis present

## 2023-01-09 DIAGNOSIS — E1169 Type 2 diabetes mellitus with other specified complication: Secondary | ICD-10-CM | POA: Diagnosis present

## 2023-01-09 DIAGNOSIS — K5900 Constipation, unspecified: Secondary | ICD-10-CM | POA: Diagnosis not present

## 2023-01-09 DIAGNOSIS — Z992 Dependence on renal dialysis: Secondary | ICD-10-CM | POA: Insufficient documentation

## 2023-01-09 DIAGNOSIS — E11649 Type 2 diabetes mellitus with hypoglycemia without coma: Secondary | ICD-10-CM | POA: Diagnosis not present

## 2023-01-09 DIAGNOSIS — K625 Hemorrhage of anus and rectum: Secondary | ICD-10-CM | POA: Diagnosis present

## 2023-01-09 DIAGNOSIS — R0902 Hypoxemia: Secondary | ICD-10-CM | POA: Diagnosis not present

## 2023-01-09 DIAGNOSIS — R531 Weakness: Secondary | ICD-10-CM | POA: Insufficient documentation

## 2023-01-09 DIAGNOSIS — R0689 Other abnormalities of breathing: Secondary | ICD-10-CM | POA: Diagnosis not present

## 2023-01-09 DIAGNOSIS — D631 Anemia in chronic kidney disease: Secondary | ICD-10-CM | POA: Diagnosis present

## 2023-01-09 DIAGNOSIS — E1122 Type 2 diabetes mellitus with diabetic chronic kidney disease: Secondary | ICD-10-CM | POA: Diagnosis present

## 2023-01-09 DIAGNOSIS — I5032 Chronic diastolic (congestive) heart failure: Secondary | ICD-10-CM | POA: Diagnosis present

## 2023-01-09 DIAGNOSIS — E039 Hypothyroidism, unspecified: Secondary | ICD-10-CM | POA: Diagnosis present

## 2023-01-09 DIAGNOSIS — K602 Anal fissure, unspecified: Secondary | ICD-10-CM | POA: Diagnosis not present

## 2023-01-09 DIAGNOSIS — R58 Hemorrhage, not elsewhere classified: Secondary | ICD-10-CM | POA: Diagnosis not present

## 2023-01-09 DIAGNOSIS — K6289 Other specified diseases of anus and rectum: Secondary | ICD-10-CM | POA: Diagnosis not present

## 2023-01-09 DIAGNOSIS — E785 Hyperlipidemia, unspecified: Secondary | ICD-10-CM | POA: Diagnosis present

## 2023-01-09 DIAGNOSIS — E871 Hypo-osmolality and hyponatremia: Secondary | ICD-10-CM | POA: Diagnosis present

## 2023-01-09 DIAGNOSIS — K922 Gastrointestinal hemorrhage, unspecified: Secondary | ICD-10-CM | POA: Diagnosis not present

## 2023-01-09 DIAGNOSIS — K8689 Other specified diseases of pancreas: Secondary | ICD-10-CM | POA: Diagnosis not present

## 2023-01-09 DIAGNOSIS — Z79899 Other long term (current) drug therapy: Secondary | ICD-10-CM | POA: Insufficient documentation

## 2023-01-09 DIAGNOSIS — D72829 Elevated white blood cell count, unspecified: Secondary | ICD-10-CM | POA: Diagnosis present

## 2023-01-09 DIAGNOSIS — I420 Dilated cardiomyopathy: Secondary | ICD-10-CM | POA: Diagnosis present

## 2023-01-09 DIAGNOSIS — K626 Ulcer of anus and rectum: Secondary | ICD-10-CM | POA: Diagnosis not present

## 2023-01-09 DIAGNOSIS — N2581 Secondary hyperparathyroidism of renal origin: Secondary | ICD-10-CM | POA: Diagnosis present

## 2023-01-09 DIAGNOSIS — Z1152 Encounter for screening for COVID-19: Secondary | ICD-10-CM | POA: Diagnosis not present

## 2023-01-09 DIAGNOSIS — K7689 Other specified diseases of liver: Secondary | ICD-10-CM | POA: Diagnosis present

## 2023-01-09 DIAGNOSIS — I5043 Acute on chronic combined systolic (congestive) and diastolic (congestive) heart failure: Secondary | ICD-10-CM | POA: Diagnosis not present

## 2023-01-09 DIAGNOSIS — K769 Liver disease, unspecified: Secondary | ICD-10-CM | POA: Diagnosis not present

## 2023-01-09 DIAGNOSIS — R935 Abnormal findings on diagnostic imaging of other abdominal regions, including retroperitoneum: Secondary | ICD-10-CM | POA: Diagnosis not present

## 2023-01-09 LAB — CBC
HCT: 29 % — ABNORMAL LOW (ref 39.0–52.0)
Hemoglobin: 9.1 g/dL — ABNORMAL LOW (ref 13.0–17.0)
MCH: 27.8 pg (ref 26.0–34.0)
MCHC: 31.4 g/dL (ref 30.0–36.0)
MCV: 88.7 fL (ref 80.0–100.0)
Platelets: 239 10*3/uL (ref 150–400)
RBC: 3.27 MIL/uL — ABNORMAL LOW (ref 4.22–5.81)
RDW: 13.8 % (ref 11.5–15.5)
WBC: 10.9 10*3/uL — ABNORMAL HIGH (ref 4.0–10.5)
nRBC: 0 % (ref 0.0–0.2)

## 2023-01-09 LAB — CBG MONITORING, ED: Glucose-Capillary: 112 mg/dL — ABNORMAL HIGH (ref 70–99)

## 2023-01-09 LAB — BASIC METABOLIC PANEL
Anion gap: 9 (ref 5–15)
BUN: 36 mg/dL — ABNORMAL HIGH (ref 8–23)
CO2: 22 mmol/L (ref 22–32)
Calcium: 8.7 mg/dL — ABNORMAL LOW (ref 8.9–10.3)
Chloride: 98 mmol/L (ref 98–111)
Creatinine, Ser: 8.85 mg/dL — ABNORMAL HIGH (ref 0.61–1.24)
GFR, Estimated: 6 mL/min — ABNORMAL LOW (ref >60)
Glucose, Bld: 114 mg/dL — ABNORMAL HIGH (ref 70–99)
Potassium: 3.3 mmol/L — ABNORMAL LOW (ref 3.5–5.1)
Sodium: 129 mmol/L — ABNORMAL LOW (ref 135–145)

## 2023-01-09 LAB — HEPATIC FUNCTION PANEL
ALT: 9 U/L (ref 0–44)
AST: 13 U/L — ABNORMAL LOW (ref 15–41)
Albumin: 2.5 g/dL — ABNORMAL LOW (ref 3.5–5.0)
Alkaline Phosphatase: 108 U/L (ref 38–126)
Bilirubin, Direct: 0.2 mg/dL (ref 0.0–0.2)
Indirect Bilirubin: 0.8 mg/dL (ref 0.3–0.9)
Total Bilirubin: 1 mg/dL (ref 0.3–1.2)
Total Protein: 5.9 g/dL — ABNORMAL LOW (ref 6.5–8.1)

## 2023-01-09 LAB — SARS CORONAVIRUS 2 BY RT PCR: SARS Coronavirus 2 by RT PCR: NEGATIVE

## 2023-01-09 MED ORDER — SODIUM CHLORIDE 0.9 % IV BOLUS
500.0000 mL | Freq: Once | INTRAVENOUS | Status: AC
  Administered 2023-01-09: 500 mL via INTRAVENOUS

## 2023-01-09 NOTE — Discharge Instructions (Signed)
As discussed, it is important that you follow-up with your dialysis center tomorrow to be dialyzed.  Please call them to get an appointment time to come in.  Also, follow-up with your nephrologist for recheck.  Return to the emergency department for any new or worsening symptoms.

## 2023-01-09 NOTE — ED Triage Notes (Signed)
Pt c/o increased weakness and diarrhea x 5 days. Pt is a dialysis pt and last tx was on Mon 22nd full tx. Left arm restriction. Pt uses a can and walker baseline and ambulated to EMS stretcher with assistance.

## 2023-01-09 NOTE — ED Notes (Signed)
Pt pulled out his IV stating he was leaving after he urinated on himself. I went to get discharge instructions for pt while ED nurse tech got him to restroom.

## 2023-01-09 NOTE — ED Provider Notes (Signed)
[facility] EMERGENCY DEPARTMENT AT [facility] Provider Note   CSN: [id number] Arrival date & time: 01/09/23  1036     History {Add pertinent medical, surgical, social history, OB history to HPI:1} Chief Complaint  Patient presents with   Weakness   Diarrhea    [name] [name] is a 62 y.o. male.  The history is provided by the patient.  Weakness Associated symptoms: diarrhea   Associated symptoms: no abdominal pain, no arthralgias, no chest pain, no dizziness, no dysuria, no fever, no headaches, no nausea, no shortness of breath and no vomiting   Diarrhea Associated symptoms: no abdominal pain, no arthralgias, no chills, no fever, no headaches and no vomiting        [name] [name] is a 62 y.o. male with past medical history type 2 diabetes, hypertension, CHF, anemia of chronic disease, end-stage renal disease recently started on hemodialysis, Monday Wednesday Friday.  Last dialysis treatment was 1 week ago.  He presents to the Emergency Department complaining of generalized weakness and constipation.  Symptoms present for nearly 1 week.  Patient is poor historian.  Triage report the patient had generalized weakness and diarrhea, on my evaluation of the patient he reports constipation for several days, but notes taking an Imodium tablet without relief.  Denies taking any laxatives.  Denies chest pain, shortness of breath fever or chills, continues to make some urine.  States he started dialysis approximately 1 month ago.  Currently has right-sided dialysis catheter and left arm AV graft both placed 12/27/2022    Home Medications Prior to Admission medications   Medication Sig Start Date End Date Taking? Authorizing Provider  Accu-Chek Softclix Lancets lancets  08/06/20   [provider]  acetaminophen (TYLENOL) 500 MG tablet Take 500 mg by mouth every 6 (six) hours as needed (arthritis).     [provider]  amitriptyline (ELAVIL) 10 MG tablet Take 10 mg  by mouth at bedtime. 05/24/22   [provider]  amLODipine (NORVASC) 5 MG tablet Take 5 mg by mouth every morning.    [provider]  atorvastatin (LIPITOR) 20 MG tablet Take 20 mg by mouth every morning. 09/23/20   [provider]  Blood Glucose Monitoring Suppl (ACCU-CHEK GUIDE) w/Device KIT in the morning. 08/06/20   [provider]  Calcium Carbonate Antacid (ALKA-SELTZER ANTACID PO) Take 1 tablet by mouth daily as needed (Heartburn). Chews    [provider]  famotidine (PEPCID) 20 MG tablet Take 1 tablet (20 mg total) by mouth 2 (two) times daily as needed for heartburn or indigestion. 01/08/18   Jacquelin Hawking, PA-C  gabapentin (NEURONTIN) 100 MG capsule Take 100 mg by mouth at bedtime.    [provider]  hydrALAZINE (APRESOLINE) 25 MG tablet Take 1 tablet (25 mg total) by mouth every 8 (eight) hours. Patient taking differently: Take 50 mg by mouth 2 (two) times daily. 09/10/20   Lewie Chamber, MD  HYDROcodone-acetaminophen (NORCO) 5-325 MG tablet Take 1 tablet by mouth every 6 (six) hours as needed for moderate pain. 12/27/22   Emilie Rutter, PA-C  levothyroxine (SYNTHROID) 50 MCG tablet Take 50 mcg by mouth daily before breakfast. 02/18/21   [provider]  metoprolol tartrate (LOPRESSOR) 50 MG tablet Take 1 tablet (50 mg total) by mouth 2 (two) times daily. 06/16/20   Jacquelin Hawking, PA-C  vitamin B-12 (CYANOCOBALAMIN) 1000 MCG tablet Take 1,000 mcg by mouth at bedtime.    [provider]      Allergies  Patient has no known allergies.    Review of Systems   Review of Systems  Constitutional:  Positive for appetite change. Negative for chills and fever.  Respiratory:  Negative for shortness of breath.   Cardiovascular:  Negative for chest pain.  Gastrointestinal:  Positive for constipation and diarrhea. Negative for abdominal pain, nausea and vomiting.  Genitourinary:  Negative for dysuria.   Musculoskeletal:  Negative for arthralgias, back pain and neck pain.  Neurological:  Positive for weakness. Negative for dizziness, syncope and headaches.    Physical Exam Updated Vital Signs BP (!) 178/64   Pulse (!) 57   Temp (!) 97.5 F (36.4 C) (Oral)   Ht 5\' 10"  (1.778 m)   Wt 77.1 kg   SpO2 96%   BMI 24.39 kg/m  Physical Exam Vitals and nursing note reviewed.  Constitutional:      General: He is not in acute distress.    Appearance: He is not toxic-appearing.  HENT:     Mouth/Throat:     Mouth: Mucous membranes are dry.  Eyes:     Extraocular Movements: Extraocular movements intact.     Conjunctiva/sclera: Conjunctivae normal.     Pupils: Pupils are equal, round, and reactive to light.  Cardiovascular:     Rate and Rhythm: Normal rate and regular rhythm.     Pulses: Normal pulses.     Comments: Palpable thrill left upper arm.  Healing incision of upper arm w/o edema or surrounding erythema.    Dialysis catheter right chest.  No edema or surrounding erythema.  Pulmonary:     Effort: Pulmonary effort is normal.  Chest:     Chest wall: No tenderness.  Musculoskeletal:        General: Normal range of motion.     Cervical back: Normal range of motion.     Right lower leg: No edema.     Left lower leg: No edema.  Skin:    General: Skin is warm.     Capillary Refill: Capillary refill takes less than 2 seconds.  Neurological:     General: No focal deficit present.     Mental Status: He is alert.     Sensory: No sensory deficit.     Motor: No weakness.     ED Results / Procedures / Treatments   Labs (all labs ordered are listed, but only abnormal results are displayed) Labs Reviewed  BASIC METABOLIC PANEL - Abnormal; Notable for the following components:      Result Value   Sodium 129 (*)    Potassium 3.3 (*)    Glucose, Bld 114 (*)    BUN 36 (*)    Creatinine, Ser 8.85 (*)    Calcium 8.7 (*)    GFR, Estimated 6 (*)    All other components within  normal limits  CBC - Abnormal; Notable for the following components:   WBC 10.9 (*)    RBC 3.27 (*)    Hemoglobin 9.1 (*)    HCT 29.0 (*)    All other components within normal limits  HEPATIC FUNCTION PANEL - Abnormal; Notable for the following components:   Total Protein 5.9 (*)    Albumin 2.5 (*)    AST 13 (*)    All other components within normal limits  CBG MONITORING, ED - Abnormal; Notable for the following components:   Glucose-Capillary 112 (*)    All other components within normal limits  SARS CORONAVIRUS 2 BY RT PCR  URINALYSIS, ROUTINE W  REFLEX MICROSCOPIC    EKG EKG Interpretation Date/Time:  Monday January 09 2023 12:05:59 EDT Ventricular Rate:  58 PR Interval:  153 QRS Duration:  96 QT Interval:  533 QTC Calculation: 524 R Axis:   25  Text Interpretation: Sinus rhythm Left ventricular hypertrophy Prolonged QT interval Baseline wander in lead(s) V3 Confirmed by Meridee Score [phone]) on 01/09/2023 12:09:58 PM  Radiology DG Abdomen Acute W/Chest  Result Date: 01/09/2023 CLINICAL DATA:  consiptation, weakness EXAM: DG ABDOMEN ACUTE WITH 1 VIEW CHEST COMPARISON:  12/27/2022. FINDINGS: There is no evidence of dilated bowel loops or free intraperitoneal air. No radiopaque calculi or other significant radiographic abnormality is seen. Heart size and mediastinal contours are within normal limits. Both lungs are clear. Note is made of elevated right hemidiaphragm. Right IJ hemodialysis catheter again seen with its tip overlying the cavoatrial junction region. IMPRESSION: Negative abdominal radiographs.  No acute cardiopulmonary disease. Electronically Signed   By: Jules Schick M.D.   On: 01/09/2023 12:52    Procedures Procedures  {Document cardiac monitor, telemetry assessment procedure when appropriate:1}  Medications Ordered in ED Medications - No data to display  ED Course/ Medical Decision Making/ A&P   {   Click here for ABCD2, HEART and other calculatorsREFRESH  Note before signing :1}                          Medical Decision Making Amount and/or Complexity of Data Reviewed Labs: ordered. Radiology: ordered. Discussion of management or test interpretation with external provider(s): Discussed findings with nephrology, Dr. Arrie Aran if patient feeling better felt that he can follow-up with his dialysis center tomorrow for his treatment. Patient resting comfortably.  No vomiting, abdominal pain or diarrhea here.  Will give trial of oral fluids.    On recheck, pt tolerating oral fluids and crackers.  Through shared decision making, pt offered hospital admission, but prefers to go home and f/u with dialysis center tomorrow for his treatment.  He was given return precautions.         {Document critical care time when appropriate:1} {Document review of labs and clinical decision tools ie heart score, Chads2Vasc2 etc:1}  {Document your independent review of radiology images, and any outside records:1} {Document your discussion with family members, caretakers, and with consultants:1} {Document social determinants of health affecting pt's care:1} {Document your decision making why or why not admission, treatments were needed:1} Final Clinical Impression(s) / ED Diagnoses Final diagnoses:  None    Rx / DC Orders ED Discharge Orders     None

## 2023-01-10 DIAGNOSIS — Z992 Dependence on renal dialysis: Secondary | ICD-10-CM | POA: Diagnosis not present

## 2023-01-10 DIAGNOSIS — N2581 Secondary hyperparathyroidism of renal origin: Secondary | ICD-10-CM | POA: Diagnosis not present

## 2023-01-10 DIAGNOSIS — N186 End stage renal disease: Secondary | ICD-10-CM | POA: Diagnosis not present

## 2023-01-11 ENCOUNTER — Inpatient Hospital Stay (HOSPITAL_COMMUNITY)
Admission: EM | Admit: 2023-01-11 | Discharge: 2023-01-14 | DRG: 377 | Disposition: A | Discharge status: Home / Self Care | Payer: Medicare HMO | Attending: Internal Medicine | Admitting: Internal Medicine

## 2023-01-11 ENCOUNTER — Other Ambulatory Visit: Payer: Self-pay

## 2023-01-11 ENCOUNTER — Emergency Department (HOSPITAL_COMMUNITY)
Admission: EM | Admit: 2023-01-11 | Discharge: 2023-01-11 | Disposition: A | Discharge status: Home / Self Care | Payer: Medicare HMO | Source: Home / Self Care | Attending: Emergency Medicine | Admitting: Emergency Medicine

## 2023-01-11 ENCOUNTER — Emergency Department (HOSPITAL_COMMUNITY): Payer: Medicare HMO

## 2023-01-11 ENCOUNTER — Encounter (HOSPITAL_COMMUNITY): Payer: Self-pay

## 2023-01-11 DIAGNOSIS — I132 Hypertensive heart and chronic kidney disease with heart failure and with stage 5 chronic kidney disease, or end stage renal disease: Secondary | ICD-10-CM | POA: Diagnosis present

## 2023-01-11 DIAGNOSIS — K922 Gastrointestinal hemorrhage, unspecified: Secondary | ICD-10-CM | POA: Diagnosis present

## 2023-01-11 DIAGNOSIS — R5381 Other malaise: Secondary | ICD-10-CM | POA: Insufficient documentation

## 2023-01-11 DIAGNOSIS — R531 Weakness: Secondary | ICD-10-CM | POA: Insufficient documentation

## 2023-01-11 DIAGNOSIS — K6289 Other specified diseases of anus and rectum: Secondary | ICD-10-CM

## 2023-01-11 DIAGNOSIS — E1169 Type 2 diabetes mellitus with other specified complication: Secondary | ICD-10-CM | POA: Diagnosis present

## 2023-01-11 DIAGNOSIS — K625 Hemorrhage of anus and rectum: Principal | ICD-10-CM

## 2023-01-11 DIAGNOSIS — E871 Hypo-osmolality and hyponatremia: Secondary | ICD-10-CM | POA: Diagnosis present

## 2023-01-11 DIAGNOSIS — E039 Hypothyroidism, unspecified: Secondary | ICD-10-CM | POA: Insufficient documentation

## 2023-01-11 DIAGNOSIS — K602 Anal fissure, unspecified: Secondary | ICD-10-CM | POA: Insufficient documentation

## 2023-01-11 DIAGNOSIS — Z9842 Cataract extraction status, left eye: Secondary | ICD-10-CM

## 2023-01-11 DIAGNOSIS — D631 Anemia in chronic kidney disease: Secondary | ICD-10-CM | POA: Diagnosis present

## 2023-01-11 DIAGNOSIS — K649 Unspecified hemorrhoids: Secondary | ICD-10-CM | POA: Diagnosis present

## 2023-01-11 DIAGNOSIS — Z87441 Personal history of nephrotic syndrome: Secondary | ICD-10-CM

## 2023-01-11 DIAGNOSIS — K5120 Ulcerative (chronic) proctitis without complications: Secondary | ICD-10-CM | POA: Diagnosis present

## 2023-01-11 DIAGNOSIS — R0689 Other abnormalities of breathing: Secondary | ICD-10-CM | POA: Diagnosis not present

## 2023-01-11 DIAGNOSIS — I10 Essential (primary) hypertension: Secondary | ICD-10-CM | POA: Diagnosis present

## 2023-01-11 DIAGNOSIS — K7689 Other specified diseases of liver: Secondary | ICD-10-CM | POA: Diagnosis present

## 2023-01-11 DIAGNOSIS — D62 Acute posthemorrhagic anemia: Secondary | ICD-10-CM

## 2023-01-11 DIAGNOSIS — E1122 Type 2 diabetes mellitus with diabetic chronic kidney disease: Secondary | ICD-10-CM | POA: Insufficient documentation

## 2023-01-11 DIAGNOSIS — E876 Hypokalemia: Secondary | ICD-10-CM | POA: Insufficient documentation

## 2023-01-11 DIAGNOSIS — Z91158 Patient's noncompliance with renal dialysis for other reason: Secondary | ICD-10-CM

## 2023-01-11 DIAGNOSIS — R0902 Hypoxemia: Secondary | ICD-10-CM | POA: Diagnosis not present

## 2023-01-11 DIAGNOSIS — K219 Gastro-esophageal reflux disease without esophagitis: Secondary | ICD-10-CM | POA: Insufficient documentation

## 2023-01-11 DIAGNOSIS — N186 End stage renal disease: Secondary | ICD-10-CM | POA: Insufficient documentation

## 2023-01-11 DIAGNOSIS — E785 Hyperlipidemia, unspecified: Secondary | ICD-10-CM | POA: Diagnosis present

## 2023-01-11 DIAGNOSIS — Z8249 Family history of ischemic heart disease and other diseases of the circulatory system: Secondary | ICD-10-CM

## 2023-01-11 DIAGNOSIS — Z992 Dependence on renal dialysis: Secondary | ICD-10-CM | POA: Insufficient documentation

## 2023-01-11 DIAGNOSIS — Z833 Family history of diabetes mellitus: Secondary | ICD-10-CM

## 2023-01-11 DIAGNOSIS — R627 Adult failure to thrive: Secondary | ICD-10-CM | POA: Diagnosis present

## 2023-01-11 DIAGNOSIS — M1990 Unspecified osteoarthritis, unspecified site: Secondary | ICD-10-CM | POA: Diagnosis present

## 2023-01-11 DIAGNOSIS — R197 Diarrhea, unspecified: Secondary | ICD-10-CM | POA: Diagnosis present

## 2023-01-11 DIAGNOSIS — Z8261 Family history of arthritis: Secondary | ICD-10-CM

## 2023-01-11 DIAGNOSIS — Z79899 Other long term (current) drug therapy: Secondary | ICD-10-CM

## 2023-01-11 DIAGNOSIS — G8929 Other chronic pain: Secondary | ICD-10-CM | POA: Diagnosis present

## 2023-01-11 DIAGNOSIS — K5900 Constipation, unspecified: Secondary | ICD-10-CM | POA: Diagnosis present

## 2023-01-11 DIAGNOSIS — Z1152 Encounter for screening for COVID-19: Secondary | ICD-10-CM

## 2023-01-11 DIAGNOSIS — K8689 Other specified diseases of pancreas: Secondary | ICD-10-CM | POA: Diagnosis present

## 2023-01-11 DIAGNOSIS — R58 Hemorrhage, not elsewhere classified: Secondary | ICD-10-CM | POA: Diagnosis not present

## 2023-01-11 DIAGNOSIS — I509 Heart failure, unspecified: Secondary | ICD-10-CM | POA: Insufficient documentation

## 2023-01-11 DIAGNOSIS — D72829 Elevated white blood cell count, unspecified: Secondary | ICD-10-CM | POA: Diagnosis present

## 2023-01-11 DIAGNOSIS — N2581 Secondary hyperparathyroidism of renal origin: Secondary | ICD-10-CM | POA: Diagnosis present

## 2023-01-11 DIAGNOSIS — I5032 Chronic diastolic (congestive) heart failure: Secondary | ICD-10-CM | POA: Diagnosis present

## 2023-01-11 DIAGNOSIS — Z9841 Cataract extraction status, right eye: Secondary | ICD-10-CM

## 2023-01-11 DIAGNOSIS — I272 Pulmonary hypertension, unspecified: Secondary | ICD-10-CM | POA: Diagnosis present

## 2023-01-11 DIAGNOSIS — Z6824 Body mass index (BMI) 24.0-24.9, adult: Secondary | ICD-10-CM

## 2023-01-11 DIAGNOSIS — Z79890 Hormone replacement therapy (postmenopausal): Secondary | ICD-10-CM

## 2023-01-11 DIAGNOSIS — E11649 Type 2 diabetes mellitus with hypoglycemia without coma: Secondary | ICD-10-CM | POA: Diagnosis not present

## 2023-01-11 DIAGNOSIS — Z8042 Family history of malignant neoplasm of prostate: Secondary | ICD-10-CM

## 2023-01-11 DIAGNOSIS — I420 Dilated cardiomyopathy: Secondary | ICD-10-CM | POA: Diagnosis present

## 2023-01-11 LAB — LIPASE, BLOOD
Lipase: 36 U/L (ref 11–51)
Lipase: 40 U/L (ref 11–51)

## 2023-01-11 LAB — LACTIC ACID, PLASMA: Lactic Acid, Venous: 2.5 mmol/L (ref 0.5–1.9)

## 2023-01-11 LAB — CBC
HCT: 30.1 % — ABNORMAL LOW (ref 39.0–52.0)
Hemoglobin: 9.5 g/dL — ABNORMAL LOW (ref 13.0–17.0)
MCH: 27.9 pg (ref 26.0–34.0)
MCHC: 31.6 g/dL (ref 30.0–36.0)
MCV: 88.5 fL (ref 80.0–100.0)
Platelets: 258 10*3/uL (ref 150–400)
RBC: 3.4 MIL/uL — ABNORMAL LOW (ref 4.22–5.81)
RDW: 13.9 % (ref 11.5–15.5)
WBC: 10.8 10*3/uL — ABNORMAL HIGH (ref 4.0–10.5)
nRBC: 0 % (ref 0.0–0.2)

## 2023-01-11 LAB — COMPREHENSIVE METABOLIC PANEL
ALT: 12 U/L (ref 0–44)
ALT: 14 U/L (ref 0–44)
AST: 31 U/L (ref 15–41)
AST: 31 U/L (ref 15–41)
Albumin: 2.5 g/dL — ABNORMAL LOW (ref 3.5–5.0)
Albumin: 2.7 g/dL — ABNORMAL LOW (ref 3.5–5.0)
Alkaline Phosphatase: 115 U/L (ref 38–126)
Alkaline Phosphatase: 118 U/L (ref 38–126)
Anion gap: 10 (ref 5–15)
Anion gap: 12 (ref 5–15)
BUN: 11 mg/dL (ref 8–23)
BUN: 14 mg/dL (ref 8–23)
CO2: 26 mmol/L (ref 22–32)
CO2: 24 mmol/L (ref 22–32)
Calcium: 8.7 mg/dL — ABNORMAL LOW (ref 8.9–10.3)
Calcium: 9.4 mg/dL (ref 8.9–10.3)
Chloride: 99 mmol/L (ref 98–111)
Chloride: 97 mmol/L — ABNORMAL LOW (ref 98–111)
Creatinine, Ser: 5.08 mg/dL — ABNORMAL HIGH (ref 0.61–1.24)
Creatinine, Ser: 5.47 mg/dL — ABNORMAL HIGH (ref 0.61–1.24)
GFR, Estimated: 12 mL/min — ABNORMAL LOW (ref >60)
GFR, Estimated: 11 mL/min — ABNORMAL LOW (ref >60)
Glucose, Bld: 115 mg/dL — ABNORMAL HIGH (ref 70–99)
Glucose, Bld: 172 mg/dL — ABNORMAL HIGH (ref 70–99)
Potassium: 3 mmol/L — ABNORMAL LOW (ref 3.5–5.1)
Potassium: 3.4 mmol/L — ABNORMAL LOW (ref 3.5–5.1)
Sodium: 135 mmol/L (ref 135–145)
Sodium: 133 mmol/L — ABNORMAL LOW (ref 135–145)
Total Bilirubin: 1 mg/dL (ref 0.3–1.2)
Total Bilirubin: 0.8 mg/dL (ref 0.3–1.2)
Total Protein: 5.7 g/dL — ABNORMAL LOW (ref 6.5–8.1)
Total Protein: 6.2 g/dL — ABNORMAL LOW (ref 6.5–8.1)

## 2023-01-11 LAB — CBC WITH DIFFERENTIAL/PLATELET
Abs Immature Granulocytes: 0.3 10*3/uL — ABNORMAL HIGH (ref 0.00–0.07)
Basophils Absolute: 0.1 10*3/uL (ref 0.0–0.1)
Basophils Relative: 1 %
Eosinophils Absolute: 0 10*3/uL (ref 0.0–0.5)
Eosinophils Relative: 0 %
HCT: 31.3 % — ABNORMAL LOW (ref 39.0–52.0)
Hemoglobin: 9.7 g/dL — ABNORMAL LOW (ref 13.0–17.0)
Immature Granulocytes: 3 %
Lymphocytes Relative: 19 %
Lymphs Abs: 2.3 10*3/uL (ref 0.7–4.0)
MCH: 28.1 pg (ref 26.0–34.0)
MCHC: 31 g/dL (ref 30.0–36.0)
MCV: 90.7 fL (ref 80.0–100.0)
Monocytes Absolute: 0.5 10*3/uL (ref 0.1–1.0)
Monocytes Relative: 4 %
Neutro Abs: 8.8 10*3/uL — ABNORMAL HIGH (ref 1.7–7.7)
Neutrophils Relative %: 73 %
Platelets: 252 10*3/uL (ref 150–400)
RBC: 3.45 MIL/uL — ABNORMAL LOW (ref 4.22–5.81)
RDW: 13.9 % (ref 11.5–15.5)
WBC: 12.1 10*3/uL — ABNORMAL HIGH (ref 4.0–10.5)
nRBC: 0 % (ref 0.0–0.2)

## 2023-01-11 MED ORDER — METRONIDAZOLE 500 MG/100ML IV SOLN
500.0000 mg | Freq: Once | INTRAVENOUS | Status: AC
  Administered 2023-01-12: 500 mg via INTRAVENOUS
  Filled 2023-01-11: qty 100

## 2023-01-11 MED ORDER — SODIUM CHLORIDE 0.9 % IV SOLN
1.0000 g | Freq: Once | INTRAVENOUS | Status: AC
  Administered 2023-01-11: 1 g via INTRAVENOUS
  Filled 2023-01-11: qty 10

## 2023-01-11 NOTE — ED Triage Notes (Signed)
Per pt's wife, pt is having diarrhea started yesterday. Pt was constipated on Monday so he took a laxative and now he's having diarrhea.

## 2023-01-11 NOTE — ED Provider Notes (Signed)
[name] [name] AT [facility] Provider Note   CSN: [id number] Arrival date & time: 01/11/23  1145     History Chief Complaint  Patient presents with   Diarrhea    HPI [name] [name] is a 62 y.o. male presenting for chief complaint of generalized malaise and weakness.  62 year old male with an extensive medical history including hypertension hyper lipidemia, diabetes, heart failure, ESRD on dialysis, pulmonary hypertension, dilated cardiomyopathy.  He is presenting with generalized weakness and fatigue.  Notably approximately 5 days ago he was started on Norco for pain after a surgical procedure.  His mother who is still is his primary caregiver states that he has had increased somnolence fatigue and inability to participate in his own ADLs since starting this medication.  It resulted in severe constipation over this past weekend with now exquisitely painful hemorrhoids.  He was seen at [facility] 2 days ago started on a laxative and has had a substantial improvement of his stool difficulty now stooling aggressively.  He states that he has developed hemorrhoids that are very painful with the frequent stooling. Mother brings him in for generalized failure to thrive described above.  Reviewed his medication list including Norco and gabapentin which she also states he started recently for chronic pain. They deny fevers chills nausea vomiting syncope shortness of breath abdominal pain or any other symptoms.  They have already communicated with the social worker at dialysis who is arranging for home health to start later this week.  Patient's recorded medical, surgical, social, medication list and allergies were reviewed in the Snapshot window as part of the initial history.   Review of Systems   Review of Systems  Constitutional:  Positive for fatigue. Negative for chills and fever.  HENT:  Negative for ear pain and sore throat.   Eyes:  Negative for pain and visual  disturbance.  Respiratory:  Negative for cough and shortness of breath.   Cardiovascular:  Negative for chest pain and palpitations.  Gastrointestinal:  Positive for diarrhea. Negative for abdominal pain and vomiting.  Genitourinary:  Negative for dysuria and hematuria.  Musculoskeletal:  Negative for arthralgias and back pain.  Skin:  Negative for color change and rash.  Neurological:  Positive for weakness. Negative for seizures and syncope.  All other systems reviewed and are negative.   Physical Exam Updated Vital Signs BP (!) 167/66   Pulse 62   Temp 97.9 F (36.6 C)   Resp 16   Ht 5\' 10"  (1.778 m)   Wt 77.1 kg   SpO2 99%   BMI 24.39 kg/m  Physical Exam Vitals and nursing note reviewed.  Constitutional:      General: He is not in acute distress.    Appearance: He is well-developed.  HENT:     Head: Normocephalic and atraumatic.  Eyes:     Conjunctiva/sclera: Conjunctivae normal.  Cardiovascular:     Rate and Rhythm: Normal rate and regular rhythm.     Heart sounds: No murmur heard. Pulmonary:     Effort: Pulmonary effort is normal. No respiratory distress.     Breath sounds: Normal breath sounds.  Abdominal:     Palpations: Abdomen is soft.     Tenderness: There is no abdominal tenderness.  Musculoskeletal:        General: No swelling.     Cervical back: Neck supple.  Skin:    General: Skin is warm and dry.     Capillary Refill: Capillary refill takes less  than 2 seconds.  Neurological:     Mental Status: He is alert.  Psychiatric:        Mood and Affect: Mood normal.      ED Course/ Medical Decision Making/ A&P    Procedures Procedures   Medications Ordered in ED Medications - No data to display Medical Decision Making:   [name] is a 62 y.o. male who presented to the ED today with altered mental status detailed above.    Additional history discussed with patient's family/caregivers.  Complete initial physical exam performed, notably the  patient  was HDS in NAD. No focal weakness.    Reviewed and confirmed nursing documentation for past medical history, family history, social history.    Initial Assessment:   With the patient's presentation of altered mental status, most likely diagnosis is delerium 2/2 infectious etiology (UTI/CAP/URI) vs metabolic abnormality (Na/K/Mg/Ca) vs nonspecific etiology. Other diagnoses were considered including (but not limited to) CVA, ICH, intracranial mass, critical dehydration, heptatic dysfunction, uremia, hypercarbia, intoxication, endrocrine abnormality, toxidrome. These are considered less likely due to history of present illness and physical exam findings.   This is most consistent with an acute life/limb threatening illness complicated by underlying chronic conditions.  Initial Plan:  Screening labs including CBC and Metabolic panel to evaluate for infectious or metabolic etiology of disease.  Considered CXR to evaluate for structural/infectious intrathoracic pathology but was performed <2 days ago  Objective evaluation as below reviewed   Initial Study Results:   Laboratory  All laboratory results reviewed without evidence of clinically relevant pathology.     Radiology:  All images reviewed independently. Agree with radiology report at this time.   DG Abdomen Acute W/Chest  Result Date: 01/09/2023 CLINICAL DATA:  consiptation, weakness EXAM: DG ABDOMEN ACUTE WITH 1 VIEW CHEST COMPARISON:  12/27/2022. FINDINGS: There is no evidence of dilated bowel loops or free intraperitoneal air. No radiopaque calculi or other significant radiographic abnormality is seen. Heart size and mediastinal contours are within normal limits. Both lungs are clear. Note is made of elevated right hemidiaphragm. Right IJ hemodialysis catheter again seen with its tip overlying the cavoatrial junction region. IMPRESSION: Negative abdominal radiographs.  No acute cardiopulmonary disease. Electronically Signed   By:  Jules Schick M.D.   On: 01/09/2023 12:52   DG CHEST PORT 1 VIEW  Result Date: 12/27/2022 CLINICAL DATA:  Status post dialysis catheter placement. EXAM: PORTABLE CHEST 1 VIEW COMPARISON:  September 07, 2020. FINDINGS: Stable cardiomediastinal silhouette. Interval placement of right internal jugular dialysis catheter with distal tip in expected position of cavoatrial junction. No pneumothorax is noted. Lungs are clear. Bony thorax is unremarkable. IMPRESSION: Interval placement of right internal jugular dialysis catheter as described above. Electronically Signed   By: Lupita Raider M.D.   On: 12/27/2022 16:12   DG C-Arm 1-60 Min-No Report  Result Date: 12/27/2022 Fluoroscopy was utilized by the requesting physician.  No radiographic interpretation.     Final Assessment and Plan:   Reassessed at bedside.  External rectal exam demonstrates an anal fissure at the top.  Likely source of his occasional bright red blood with defecation.  Also very poor anal hygiene.  Discussed risk of infection risk of progression of disease and need for aggressive hygiene.  Family ember at bedside is in agreement. His altered mental status remains most consistent with polypharmacy from his combined gabapentin, narcotic utilization in the setting of ESRD.  He is already improving here in the emergency room. Discussed social work  consultation in the emergency room but they have already engaged with outpatient social work consultation and they are requesting discharge at this time.  Given lack of any other acute emergency pathology identified, supportive care for the anal fissure was discussed and patient discharged with no further acute events.    Clinical Impression:  1. Anal fissure      Discharge   Final Clinical Impression(s) / ED Diagnoses Final diagnoses:  Anal fissure    Rx / DC Orders ED Discharge Orders     None         Glyn Ade, MD 01/11/23 [phone]

## 2023-01-11 NOTE — ED Notes (Signed)
Patient transported to CT 

## 2023-01-11 NOTE — ED Provider Notes (Signed)
[facility] AT [facility] Provider Note   CSN: [id number] Arrival date & time: 01/11/23  2047     History {Add pertinent medical, surgical, social history, OB history to HPI:1} Chief Complaint  Patient presents with   Rectal Bleeding    [name] [name] is a 62 y.o. male.   Rectal Bleeding Patient presents for rectal bleeding.  Medical history includeshypertension hyper lipidemia, diabetes, heart failure, ESRD on dialysis, pulmonary hypertension, dilated cardiomyopathy.  He undergoes dialysis on M, W, F.  He missed Monday but did a make-up session yesterday.  He presented to [facility] ED earlier today for generalized weakness and fatigue.  Shortly after his discharge, he had large volume of bright red stool per rectum.  EMS noted a trash bag full of soiled bloody towels on scene.  Patient had ongoing rectal bleeding during transit.  Despite this, EMS reports normal blood pressure.  Patient is bedbound at baseline.     Home Medications Prior to Admission medications   Medication Sig Start Date End Date Taking? Authorizing Provider  Accu-Chek Softclix Lancets lancets  08/06/20   [provider]  acetaminophen (TYLENOL) 500 MG tablet Take 500 mg by mouth every 6 (six) hours as needed (arthritis).     [provider]  amitriptyline (ELAVIL) 10 MG tablet Take 10 mg by mouth at bedtime. 05/24/22   [provider]  amLODipine (NORVASC) 5 MG tablet Take 5 mg by mouth every morning.    [provider]  atorvastatin (LIPITOR) 20 MG tablet Take 20 mg by mouth every morning. 09/23/20   [provider]  Blood Glucose Monitoring Suppl (ACCU-CHEK GUIDE) w/Device KIT in the morning. 08/06/20   [provider]  famotidine (PEPCID) 20 MG tablet Take 1 tablet (20 mg total) by mouth 2 (two) times daily as needed for heartburn or indigestion. 01/08/18   Jacquelin Hawking, PA-C  gabapentin (NEURONTIN) 100 MG capsule Take 100 mg  by mouth at bedtime.    [provider]  hydrALAZINE (APRESOLINE) 25 MG tablet Take 1 tablet (25 mg total) by mouth every 8 (eight) hours. Patient taking differently: Take 50 mg by mouth 2 (two) times daily. 09/10/20   Lewie Chamber, MD  HYDROcodone-acetaminophen (NORCO) 5-325 MG tablet Take 1 tablet by mouth every 6 (six) hours as needed for moderate pain. Patient not taking: Reported on 01/09/2023 12/27/22   Emilie Rutter, PA-C  levothyroxine (SYNTHROID) 50 MCG tablet Take 50 mcg by mouth daily before breakfast. 02/18/21   [provider]  Methoxy PEG-Epoetin Beta (MIRCERA IJ) Mircera Patient not taking: Reported on 01/09/2023 01/02/23 01/01/24  [provider]  metoprolol tartrate (LOPRESSOR) 50 MG tablet Take 1 tablet (50 mg total) by mouth 2 (two) times daily. 06/16/20   Jacquelin Hawking, PA-C  vitamin B-12 (CYANOCOBALAMIN) 1000 MCG tablet Take 1,000 mcg by mouth at bedtime.    [provider]      Allergies    Patient has no known allergies.    Review of Systems   Review of Systems  Constitutional:  Positive for fatigue.  Gastrointestinal:  Positive for anal bleeding and hematochezia.  Neurological:  Positive for weakness (generalized).  All other systems reviewed and are negative.   Physical Exam Updated Vital Signs There were no vitals taken for this visit. Physical Exam Vitals and nursing note reviewed.  Constitutional:      General: He is not in acute distress.    Appearance: He is well-developed. He is ill-appearing. He  is not toxic-appearing or diaphoretic.  HENT:     Head: Normocephalic and atraumatic.     Right Ear: External ear normal.     Left Ear: External ear normal.     Nose: Nose normal.     Mouth/Throat:     Mouth: Mucous membranes are moist.  Eyes:     Extraocular Movements: Extraocular movements intact.     Conjunctiva/sclera: Conjunctivae normal.  Cardiovascular:     Rate and Rhythm: Normal rate and regular rhythm.   Pulmonary:     Effort: Pulmonary effort is normal. No respiratory distress.  Abdominal:     General: There is no distension.     Palpations: Abdomen is soft.     Tenderness: There is no abdominal tenderness.  Musculoskeletal:        General: No deformity.     Cervical back: Normal range of motion and neck supple.  Skin:    General: Skin is warm and dry.     Coloration: Skin is pale.  Neurological:     General: No focal deficit present.     Mental Status: He is alert and oriented to person, place, and time.  Psychiatric:        Mood and Affect: Mood normal.        Behavior: Behavior normal.     ED Results / Procedures / Treatments   Labs (all labs ordered are listed, but only abnormal results are displayed) Labs Reviewed - No data to display  EKG None  Radiology No results found.  Procedures Procedures  {Document cardiac monitor, telemetry assessment procedure when appropriate:1}  Medications Ordered in ED Medications - No data to display  ED Course/ Medical Decision Making/ A&P   {   Click here for ABCD2, HEART and other calculatorsREFRESH Note before signing :1}                              Medical Decision Making  This patient presents to the ED for concern of ***, this involves an extensive number of treatment options, and is a complaint that carries with it a high risk of complications and morbidity.  The differential diagnosis includes ***   Co morbidities that complicate the patient evaluation  ***   Additional history obtained:  Additional history obtained from *** External records from outside source obtained and reviewed including ***   Lab Tests:  I Ordered, and personally interpreted labs.  The pertinent results include:  ***   Imaging Studies ordered:  I ordered imaging studies including ***  I independently visualized and interpreted imaging which showed *** I agree with the radiologist interpretation   Cardiac Monitoring: /  EKG:  The patient was maintained on a cardiac monitor.  I personally viewed and interpreted the cardiac monitored which showed an underlying rhythm of: ***   Consultations Obtained:  I requested consultation with the ***,  and discussed lab and imaging findings as well as pertinent plan - they recommend: ***   Problem List / ED Course / Critical interventions / Medication management  Patient presenting for rectal bleeding.  Seen at [facility] earlier today.  Bleeding worsened after discharge.  On arrival, patient is pale and ill-appearing.  EMS reported normal blood pressure prior to arrival.  There is a large amount of red, coagulating blood present on ED EMS stretcher sheet.  On visual inspection of his rectal area, there is no ongoing bleeding.  Workup was  initiated.***. I ordered medication including ***  for ***  Reevaluation of the patient after these medicines showed that the patient {resolved/improved/worsened:23923::"improved"} I have reviewed the patients home medicines and have made adjustments as needed   Social Determinants of Health:  ***   Test / Admission - Considered:  ***   {Document critical care time when appropriate:1} {Document review of labs and clinical decision tools ie heart score, Chads2Vasc2 etc:1}  {Document your independent review of radiology images, and any outside records:1} {Document your discussion with family members, caretakers, and with consultants:1} {Document social determinants of health affecting pt's care:1} {Document your decision making why or why not admission, treatments were needed:1} Final Clinical Impression(s) / ED Diagnoses Final diagnoses:  None    Rx / DC Orders ED Discharge Orders     None

## 2023-01-11 NOTE — ED Triage Notes (Signed)
Pt bib [facility] from home c/o rectal bleeding. Pt was seen earlier today at [facility] for anal fissure and was d/c home. Pt states the bleeding is worse.

## 2023-01-12 ENCOUNTER — Encounter (HOSPITAL_COMMUNITY): Payer: Self-pay | Admitting: Family Medicine

## 2023-01-12 DIAGNOSIS — K922 Gastrointestinal hemorrhage, unspecified: Secondary | ICD-10-CM

## 2023-01-12 DIAGNOSIS — K920 Hematemesis: Secondary | ICD-10-CM

## 2023-01-12 DIAGNOSIS — I10 Essential (primary) hypertension: Secondary | ICD-10-CM

## 2023-01-12 DIAGNOSIS — K219 Gastro-esophageal reflux disease without esophagitis: Secondary | ICD-10-CM | POA: Diagnosis present

## 2023-01-12 DIAGNOSIS — E785 Hyperlipidemia, unspecified: Secondary | ICD-10-CM

## 2023-01-12 DIAGNOSIS — Z992 Dependence on renal dialysis: Secondary | ICD-10-CM | POA: Diagnosis not present

## 2023-01-12 DIAGNOSIS — K5120 Ulcerative (chronic) proctitis without complications: Secondary | ICD-10-CM | POA: Diagnosis present

## 2023-01-12 DIAGNOSIS — I132 Hypertensive heart and chronic kidney disease with heart failure and with stage 5 chronic kidney disease, or end stage renal disease: Secondary | ICD-10-CM | POA: Diagnosis present

## 2023-01-12 DIAGNOSIS — N2581 Secondary hyperparathyroidism of renal origin: Secondary | ICD-10-CM | POA: Diagnosis present

## 2023-01-12 DIAGNOSIS — K7689 Other specified diseases of liver: Secondary | ICD-10-CM | POA: Diagnosis present

## 2023-01-12 DIAGNOSIS — E11649 Type 2 diabetes mellitus with hypoglycemia without coma: Secondary | ICD-10-CM | POA: Diagnosis not present

## 2023-01-12 DIAGNOSIS — K626 Ulcer of anus and rectum: Secondary | ICD-10-CM

## 2023-01-12 DIAGNOSIS — K6289 Other specified diseases of anus and rectum: Secondary | ICD-10-CM | POA: Diagnosis not present

## 2023-01-12 DIAGNOSIS — K921 Melena: Secondary | ICD-10-CM | POA: Diagnosis present

## 2023-01-12 DIAGNOSIS — I5032 Chronic diastolic (congestive) heart failure: Secondary | ICD-10-CM | POA: Diagnosis present

## 2023-01-12 DIAGNOSIS — E1169 Type 2 diabetes mellitus with other specified complication: Secondary | ICD-10-CM | POA: Diagnosis present

## 2023-01-12 DIAGNOSIS — I420 Dilated cardiomyopathy: Secondary | ICD-10-CM | POA: Diagnosis present

## 2023-01-12 DIAGNOSIS — N186 End stage renal disease: Secondary | ICD-10-CM | POA: Insufficient documentation

## 2023-01-12 DIAGNOSIS — K625 Hemorrhage of anus and rectum: Secondary | ICD-10-CM | POA: Diagnosis not present

## 2023-01-12 DIAGNOSIS — E876 Hypokalemia: Secondary | ICD-10-CM | POA: Insufficient documentation

## 2023-01-12 DIAGNOSIS — E039 Hypothyroidism, unspecified: Secondary | ICD-10-CM | POA: Diagnosis present

## 2023-01-12 DIAGNOSIS — Z1152 Encounter for screening for COVID-19: Secondary | ICD-10-CM | POA: Diagnosis not present

## 2023-01-12 DIAGNOSIS — D62 Acute posthemorrhagic anemia: Secondary | ICD-10-CM | POA: Diagnosis present

## 2023-01-12 DIAGNOSIS — D631 Anemia in chronic kidney disease: Secondary | ICD-10-CM | POA: Diagnosis present

## 2023-01-12 DIAGNOSIS — E871 Hypo-osmolality and hyponatremia: Secondary | ICD-10-CM | POA: Diagnosis present

## 2023-01-12 DIAGNOSIS — D72829 Elevated white blood cell count, unspecified: Secondary | ICD-10-CM | POA: Diagnosis present

## 2023-01-12 DIAGNOSIS — G8929 Other chronic pain: Secondary | ICD-10-CM | POA: Diagnosis present

## 2023-01-12 DIAGNOSIS — K769 Liver disease, unspecified: Secondary | ICD-10-CM

## 2023-01-12 DIAGNOSIS — I272 Pulmonary hypertension, unspecified: Secondary | ICD-10-CM | POA: Diagnosis present

## 2023-01-12 DIAGNOSIS — I5043 Acute on chronic combined systolic (congestive) and diastolic (congestive) heart failure: Secondary | ICD-10-CM | POA: Diagnosis not present

## 2023-01-12 DIAGNOSIS — E1122 Type 2 diabetes mellitus with diabetic chronic kidney disease: Secondary | ICD-10-CM | POA: Diagnosis present

## 2023-01-12 LAB — HEMOGLOBIN AND HEMATOCRIT, BLOOD
HCT: 23.1 % — ABNORMAL LOW (ref 39.0–52.0)
HCT: 20.7 % — ABNORMAL LOW (ref 39.0–52.0)
HCT: 22.6 % — ABNORMAL LOW (ref 39.0–52.0)
HCT: 28.1 % — ABNORMAL LOW (ref 39.0–52.0)
Hemoglobin: 7.2 g/dL — ABNORMAL LOW (ref 13.0–17.0)
Hemoglobin: 6.5 g/dL — CL (ref 13.0–17.0)
Hemoglobin: 7.2 g/dL — ABNORMAL LOW (ref 13.0–17.0)
Hemoglobin: 9.2 g/dL — ABNORMAL LOW (ref 13.0–17.0)

## 2023-01-12 LAB — COMPREHENSIVE METABOLIC PANEL
ALT: 11 U/L (ref 0–44)
AST: 17 U/L (ref 15–41)
Albumin: 2.2 g/dL — ABNORMAL LOW (ref 3.5–5.0)
Alkaline Phosphatase: 90 U/L (ref 38–126)
Anion gap: 9 (ref 5–15)
BUN: 20 mg/dL (ref 8–23)
CO2: 25 mmol/L (ref 22–32)
Calcium: 8.3 mg/dL — ABNORMAL LOW (ref 8.9–10.3)
Chloride: 98 mmol/L (ref 98–111)
Creatinine, Ser: 5.85 mg/dL — ABNORMAL HIGH (ref 0.61–1.24)
GFR, Estimated: 10 mL/min — ABNORMAL LOW (ref >60)
Glucose, Bld: 109 mg/dL — ABNORMAL HIGH (ref 70–99)
Potassium: 3 mmol/L — ABNORMAL LOW (ref 3.5–5.1)
Sodium: 132 mmol/L — ABNORMAL LOW (ref 135–145)
Total Bilirubin: 0.6 mg/dL (ref 0.3–1.2)
Total Protein: 4.7 g/dL — ABNORMAL LOW (ref 6.5–8.1)

## 2023-01-12 LAB — PREPARE RBC (CROSSMATCH)

## 2023-01-12 LAB — ABO/RH: ABO/RH(D): AB POS

## 2023-01-12 LAB — GLUCOSE, CAPILLARY
Glucose-Capillary: 104 mg/dL — ABNORMAL HIGH (ref 70–99)
Glucose-Capillary: 100 mg/dL — ABNORMAL HIGH (ref 70–99)
Glucose-Capillary: 96 mg/dL (ref 70–99)
Glucose-Capillary: 109 mg/dL — ABNORMAL HIGH (ref 70–99)

## 2023-01-12 LAB — MAGNESIUM: Magnesium: 1.9 mg/dL (ref 1.7–2.4)

## 2023-01-12 LAB — HEMOGLOBIN A1C
Hgb A1c MFr Bld: 5.1 % (ref 4.8–5.6)
Mean Plasma Glucose: 99.67 mg/dL

## 2023-01-12 LAB — PROCALCITONIN: Procalcitonin: 0.39 ng/mL

## 2023-01-12 LAB — HIV ANTIBODY (ROUTINE TESTING W REFLEX): HIV Screen 4th Generation wRfx: NONREACTIVE

## 2023-01-12 LAB — HEPATITIS B SURFACE ANTIGEN: Hepatitis B Surface Ag: NONREACTIVE

## 2023-01-12 MED ORDER — SODIUM CHLORIDE 0.9% IV SOLUTION: Freq: Once | INTRAVENOUS | Status: AC

## 2023-01-12 MED ORDER — ACETAMINOPHEN 650 MG RE SUPP: 650.0000 mg | Freq: Four times a day (QID) | RECTAL | Status: DC | PRN

## 2023-01-12 MED ORDER — METOPROLOL TARTRATE 50 MG PO TABS
50.0000 mg | ORAL_TABLET | Freq: Two times a day (BID) | ORAL | Status: DC
  Administered 2023-01-12 – 2023-01-14 (×4): 50 mg via ORAL
  Filled 2023-01-12 (×4): qty 1

## 2023-01-12 MED ORDER — CHLORHEXIDINE GLUCONATE CLOTH 2 % EX PADS
6.0000 | MEDICATED_PAD | Freq: Every day | CUTANEOUS | Status: DC
  Administered 2023-01-12 – 2023-01-14 (×3): 6 via TOPICAL

## 2023-01-12 MED ORDER — ONDANSETRON HCL 4 MG/2ML IJ SOLN: 4.0000 mg | Freq: Four times a day (QID) | INTRAMUSCULAR | Status: DC | PRN

## 2023-01-12 MED ORDER — ACETAMINOPHEN 325 MG PO TABS
650.0000 mg | ORAL_TABLET | Freq: Four times a day (QID) | ORAL | Status: DC | PRN
  Administered 2023-01-12: 650 mg via ORAL
  Filled 2023-01-12: qty 2

## 2023-01-12 MED ORDER — AMITRIPTYLINE HCL 10 MG PO TABS
10.0000 mg | ORAL_TABLET | Freq: Every day | ORAL | Status: DC
  Administered 2023-01-12 – 2023-01-13 (×3): 10 mg via ORAL
  Filled 2023-01-12 (×3): qty 1

## 2023-01-12 MED ORDER — SODIUM CHLORIDE 0.9 % IV SOLN
2.0000 g | INTRAVENOUS | Status: DC
  Administered 2023-01-12 – 2023-01-14 (×3): 2 g via INTRAVENOUS
  Filled 2023-01-12 (×3): qty 20

## 2023-01-12 MED ORDER — LEVOTHYROXINE SODIUM 50 MCG PO TABS
50.0000 ug | ORAL_TABLET | Freq: Every day | ORAL | Status: DC
  Administered 2023-01-12 – 2023-01-14 (×3): 50 ug via ORAL
  Filled 2023-01-12 (×3): qty 1

## 2023-01-12 MED ORDER — FAMOTIDINE 20 MG PO TABS: 20.0000 mg | ORAL_TABLET | Freq: Two times a day (BID) | ORAL | Status: DC | PRN

## 2023-01-12 MED ORDER — PANTOPRAZOLE SODIUM 40 MG IV SOLR
40.0000 mg | Freq: Two times a day (BID) | INTRAVENOUS | Status: DC
  Administered 2023-01-12 – 2023-01-14 (×6): 40 mg via INTRAVENOUS
  Filled 2023-01-12 (×6): qty 10

## 2023-01-12 MED ORDER — ATORVASTATIN CALCIUM 20 MG PO TABS
20.0000 mg | ORAL_TABLET | Freq: Every morning | ORAL | Status: DC
  Administered 2023-01-12 – 2023-01-14 (×2): 20 mg via ORAL
  Filled 2023-01-12 (×2): qty 1

## 2023-01-12 MED ORDER — POTASSIUM CHLORIDE IN NACL 40-0.9 MEQ/L-% IV SOLN: INTRAVENOUS | Status: DC

## 2023-01-12 MED ORDER — BISACODYL 5 MG PO TBEC
10.0000 mg | DELAYED_RELEASE_TABLET | Freq: Once | ORAL | Status: AC
  Administered 2023-01-12: 10 mg via ORAL
  Filled 2023-01-12: qty 2

## 2023-01-12 MED ORDER — ONDANSETRON HCL 4 MG PO TABS: 4.0000 mg | ORAL_TABLET | Freq: Four times a day (QID) | ORAL | Status: DC | PRN

## 2023-01-12 MED ORDER — INSULIN ASPART 100 UNIT/ML IJ SOLN: 0.0000 [IU] | Freq: Every day | INTRAMUSCULAR | Status: DC

## 2023-01-12 MED ORDER — PEG 3350-KCL-NA BICARB-NACL 420 G PO SOLR
4000.0000 mL | Freq: Once | ORAL | Status: AC
  Administered 2023-01-12: 4000 mL via ORAL

## 2023-01-12 MED ORDER — GABAPENTIN 100 MG PO CAPS
100.0000 mg | ORAL_CAPSULE | Freq: Every day | ORAL | Status: DC
  Administered 2023-01-12 – 2023-01-13 (×3): 100 mg via ORAL
  Filled 2023-01-12 (×3): qty 1

## 2023-01-12 MED ORDER — INSULIN ASPART 100 UNIT/ML IJ SOLN: 0.0000 [IU] | Freq: Three times a day (TID) | INTRAMUSCULAR | Status: DC

## 2023-01-12 MED ORDER — OXYCODONE HCL 5 MG PO TABS
5.0000 mg | ORAL_TABLET | ORAL | Status: DC | PRN
  Administered 2023-01-12 (×2): 5 mg via ORAL
  Filled 2023-01-12 (×2): qty 1

## 2023-01-12 NOTE — Assessment & Plan Note (Addendum)
-  With concerns for stercoral colitis and rectal ulcer. -Inadequate bowel preparation during colonoscopy evaluation. -Laxative/bowel regimen as per GI recommendation -Continue antibiotics for now -Advance diet as tolerated -After 2 units PRBCs hemoglobin has stabilized at 8.3 -Patient is hemodynamically stable. -Follow clinical response.

## 2023-01-12 NOTE — Assessment & Plan Note (Addendum)
-   Continue current antibiotics -Follow GI service recommendations. -Laxative/bowel regimen initiated.

## 2023-01-12 NOTE — ED Notes (Signed)
ED TO INPATIENT HANDOFF REPORT  ED Nurse Name and Phone #: [place] P[place] Name/Age/Gender [name] [name] 62 y.o. male Room/Bed: APA19/APA19  Code Status   Code Status: Prior  Home/SNF/Other Home Patient oriented to: self, place, time, and situation Is this baseline? Yes   Triage Complete: Triage complete  Chief Complaint GI bleed [K92.2]  Triage Note Pt bib [facility] from home c/o rectal bleeding. Pt was seen earlier today at [facility] for anal fissure and was d/c home. Pt states the bleeding is worse.    Allergies No Known Allergies  Level of Care/Admitting Diagnosis ED Disposition     ED Disposition  Admit   Condition  --   Comment  Hospital Area: [facility] [[id number]]  Level of Care: Telemetry [5]  Covid Evaluation: Asymptomatic - no recent exposure (last 10 days) testing not required  Diagnosis: GI bleed [643329]  Admitting Physician: Lilyan Gilford [[id number]]  Attending Physician: Lilyan Gilford [[id number]]          B Medical/Surgery History Past Medical History:  Diagnosis Date   Anemia    Arthritis    Cardiomyopathy (HCC)    July 2022   Diabetes mellitus (HCC)    GERD (gastroesophageal reflux disease)    HTN (hypertension)    Hyperlipidemia    Nephrotic syndrome    Obesity    Pulmonary hypertension (HCC)    July 2022 in setting of nephrotic syndrome   Secondary hyperparathyroidism of renal origin [facility])    Past Surgical History:  Procedure Laterality Date   AV FISTULA PLACEMENT Left 12/27/2022   Procedure: INSERTION OF LEFT ARM ARTERIOVENOUS (AV) GORE-TEX GRAFT;  Surgeon: Chuck Hint, MD;  Location: [facility] OR;  Service: Vascular;  Laterality: Left;   CATARACT EXTRACTION, BILATERAL  2018   INSERTION OF DIALYSIS CATHETER Right 12/27/2022   Procedure: INSERTION OF Right Internal Jugular  DIALYSIS CATHETER;  Surgeon: Chuck Hint, MD;  Location: [facility] OR;  Service: Vascular;  Laterality: Right;   TONSILLECTOMY       A IV  Location/Drains/Wounds Patient Lines/Drains/Airways Status     Active Line/Drains/Airways     Name Placement date Placement time Site Days   Peripheral IV 01/11/23 18 G 1.88" Anterior;Right Forearm 01/11/23  2113  Forearm  1   Fistula / Graft Left Upper arm Arteriovenous vein graft 12/27/22  1122  Upper arm  16   Hemodialysis Catheter Right Internal jugular Double lumen Permanent (Tunneled) 12/27/22  0948  Internal jugular  16   Wound / Incision (Open or Dehisced) 09/04/20 Puncture Back Left;Lower biopsy site 09/04/20  0946  Back  860            Intake/Output Last 24 hours  Intake/Output Summary (Last 24 hours) at 01/12/2023 0021 Last data filed at 01/12/2023 0009 Gross per 24 hour  Intake 100 ml  Output --  Net 100 ml    Labs/Imaging Results for orders placed or performed during the hospital encounter of 01/11/23 (from the past 48 hour(s))  Comprehensive metabolic panel     Status: Abnormal   Collection Time: 01/11/23  9:04 PM  Result Value Ref Range   Sodium 133 (L) 135 - 145 mmol/L   Potassium 3.4 (L) 3.5 - 5.1 mmol/L   Chloride 97 (L) 98 - 111 mmol/L   CO2 24 22 - 32 mmol/L   Glucose, Bld 172 (H) 70 - 99 mg/dL    Comment: Glucose reference range applies only to samples taken after fasting for at least  8 hours.   BUN 14 8 - 23 mg/dL   Creatinine, Ser 1.61 (H) 0.61 - 1.24 mg/dL   Calcium 9.4 8.9 - 09.6 mg/dL   Total Protein 6.2 (L) 6.5 - 8.1 g/dL   Albumin 2.7 (L) 3.5 - 5.0 g/dL   AST 31 15 - 41 U/L   ALT 14 0 - 44 U/L   Alkaline Phosphatase 118 38 - 126 U/L   Total Bilirubin 0.8 0.3 - 1.2 mg/dL   GFR, Estimated 11 (L) >60 mL/min    Comment: (NOTE) Calculated using the CKD-EPI Creatinine Equation (2021)    Anion gap 12 5 - 15    Comment: Performed at [facility], [address], [place], [place] [address]  Lipase, blood     Status: None   Collection Time: 01/11/23  9:04 PM  Result Value Ref Range   Lipase 40 11 - 51 U/L    Comment: Performed at [facility], [address], [place], [place] [address]  CBC with Diff     Status: Abnormal   Collection Time: 01/11/23  9:04 PM  Result Value Ref Range   WBC 12.1 (H) 4.0 - 10.5 K/uL   RBC 3.45 (L) 4.22 - 5.81 MIL/uL   Hemoglobin 9.7 (L) 13.0 - 17.0 g/dL   HCT 14.7 (L) 82.9 - 56.2 %   MCV 90.7 80.0 - 100.0 fL   MCH 28.1 26.0 - 34.0 pg   MCHC 31.0 30.0 - 36.0 g/dL   RDW 13.0 86.5 - 78.4 %   Platelets 252 150 - 400 K/uL   nRBC 0.0 0.0 - 0.2 %   Neutrophils Relative % 73 %   Neutro Abs 8.8 (H) 1.7 - 7.7 K/uL   Lymphocytes Relative 19 %   Lymphs Abs 2.3 0.7 - 4.0 K/uL   Monocytes Relative 4 %   Monocytes Absolute 0.5 0.1 - 1.0 K/uL   Eosinophils Relative 0 %   Eosinophils Absolute 0.0 0.0 - 0.5 K/uL   Basophils Relative 1 %   Basophils Absolute 0.1 0.0 - 0.1 K/uL   Immature Granulocytes 3 %   Abs Immature Granulocytes 0.30 (H) 0.00 - 0.07 K/uL    Comment: Performed at [facility], [address] [place], [place] [address]  Lactic acid, plasma     Status: Abnormal   Collection Time: 01/11/23  9:04 PM  Result Value Ref Range   Lactic Acid, Venous 2.5 (HH) 0.5 - 1.9 mmol/L    Comment: CRITICAL RESULT CALLED TO, READ BACK BY AND VERIFIED WITH M.MOSTELLER AT 2149 ON 07.31.24 BY ADGER J Performed at [facility], [address] [place], [place] [address]   Type and screen [facility]     Status: None   Collection Time: 01/11/23  9:04 PM  Result Value Ref Range   ABO/RH(D) AB POS    Antibody Screen NEG    Sample Expiration      01/14/2023,2359 Performed at [facility], [address], [place], [place] [address]    CT ABDOMEN PELVIS WO CONTRAST  Result Date: 01/11/2023 CLINICAL DATA:  Rectal bleeding, anal fissure EXAM: CT ABDOMEN AND PELVIS WITHOUT CONTRAST TECHNIQUE: Multidetector CT imaging of the abdomen and pelvis was performed following the standard protocol without IV contrast. Unenhanced CT was performed per clinician order. Lack of IV contrast limits sensitivity and  specificity, especially for evaluation of abdominal/pelvic solid viscera. RADIATION DOSE REDUCTION: This exam was performed according to the departmental dose-optimization program which includes automated exposure control, adjustment  of the mA and/or kV according to patient size and/or use of iterative reconstruction technique. COMPARISON:  None Available. FINDINGS: Lower chest: Small bilateral pleural effusions. No acute airspace disease. Hepatobiliary: Indeterminate 1.4 cm hypodensity left lobe liver, likely a cyst or hemangioma. Gallbladder is unremarkable. No biliary duct dilation. Pancreas: There is diffuse fatty atrophy of the pancreas. Multiple small cystic areas are seen throughout the pancreas, greatest in the head and body. Largest in the head measures 9 mm and largest in the tail measures 12 mm. No acute inflammatory change. Spleen: Unremarkable unenhanced appearance. Adrenals/Urinary Tract: The adrenals are unremarkable. No urinary tract calculi or obstructive uropathy within either kidney. The bladder is moderately distended with no filling defect. Stomach/Bowel: No bowel obstruction or ileus. Normal appendix right lower quadrant. There is circumferential wall thickening of the rectum, with mild perirectal fat stranding. Findings are consistent with proctitis. The anal verge and perineum are not included on this exam due to slice selection, and the reported anal fissure is not well visualized. Vascular/Lymphatic: Aortic atherosclerosis. No enlarged abdominal or pelvic lymph nodes. Reproductive: Prostate is unremarkable. Other: No free intraperitoneal fluid or free gas. No abdominal wall hernia. Musculoskeletal: No acute or destructive bony abnormalities. IMPRESSION: 1. Circumferential rectal wall thickening with perirectal fat stranding, which could reflect proctitis given clinical presentation. Follow-up is recommended to document resolution and exclude underlying neoplasm. 2. Incidental cystic lesions  within the pancreas, superimposed upon background fatty pancreatic atrophy. Largest in the pancreatic tail measures 12 mm. 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 3. Small bilateral pleural effusions. 4. Indeterminate 1.4 cm hypodensity left lobe liver, favor a cyst or hemangioma. This could be reassessed at the time of follow-up imaging for the pancreas. 5.  Aortic Atherosclerosis (ICD10-I70.0). Electronically Signed   By: Sharlet Salina M.D.   On: 01/11/2023 22:48    Pending Labs Unresulted Labs (From admission, onward)     Start     Ordered   01/11/23 2101  Lactic acid, plasma  STAT Now then every 3 hours,   R      01/11/23 2101            Vitals/Pain Today's Vitals   01/11/23 2240 01/11/23 2314 01/11/23 2315 01/11/23 2345  BP:   138/65 (!) 144/74  Pulse: (!) 58  (!) 57 (!) 57  Resp: 15  17 12   Temp:      TempSrc:      SpO2: 99%  98% 99%  PainSc:  2       Isolation Precautions No active isolations  Medications Medications  metroNIDAZOLE (FLAGYL) IVPB 500 mg (500 mg Intravenous New Bag/Given 01/12/23 0013)  cefTRIAXone (ROCEPHIN) 1 g in sodium chloride 0.9 % 100 mL IVPB (0 g Intravenous Stopped 01/12/23 0009)    Mobility walks with device      R Recommendations: See Admitting Provider Note  Report given to:   Additional Notes:   MWF dialysis

## 2023-01-12 NOTE — Assessment & Plan Note (Addendum)
-   Continue to follow CBG fluctuation -Sliding scale insulin has been ordered -Continue statin.

## 2023-01-12 NOTE — Assessment & Plan Note (Signed)
Continue Synthroid °

## 2023-01-12 NOTE — H&P (Signed)
History and Physical    Patient: [name] [name] AVW:[id number] DOB: [birth year] DOA: 01/11/2023 DOS: the patient was seen and examined on 01/12/2023 PCP: Farris Has, MD  Patient coming from: Home  Chief Complaint:  Chief Complaint  Patient presents with   Rectal Bleeding   HPI: [name] [name] is a 62 y.o. male with medical history significant of anemia, cardiomyopathy, diabetes mellitus type 2, GERD, ESRD, hypertension, hyperlipidemia, pulmonary hypertension, and more presents the ED with a chief complaint of rectal bleeding.  Patient does have ESRD and does dialysis Monday, Wednesday, Friday.  He missed this past Monday but did have a make-up session the next day.  Patient had 2 ER visits on the day of presentation.  The first was to [facility] for generalized weakness and fatigue.  After his discharge he had what was noted to be a large volume of bright red blood per rectum.  EMS had noted a bag of soil bloody towels when they arrived to pick him up.  ED provider reported that there was bloody clots on the sheet that EMS transported him with at time of arrival.  Patient's room smells like a GI bleed.  Patient was not able to answer any of my questions.  Apparently he was oriented for the nurse earlier, but I will, no up out of a dead sleep and he makes eye contact, is clearly protecting his airway, but then goes back to sleep without answering questions.  Unfortunately no further history could be obtained at this time.  No ACP documents. Review of Systems: unable to review all systems due to the inability of the patient to answer questions. Past Medical History:  Diagnosis Date   Anemia    Arthritis    Cardiomyopathy [facility])    July 2022   Diabetes mellitus (HCC)    GERD (gastroesophageal reflux disease)    HTN (hypertension)    Hyperlipidemia    Nephrotic syndrome    Obesity    Pulmonary hypertension (HCC)    July 2022 in setting of nephrotic syndrome   Secondary hyperparathyroidism of  renal origin [facility])    Past Surgical History:  Procedure Laterality Date   AV FISTULA PLACEMENT Left 12/27/2022   Procedure: INSERTION OF LEFT ARM ARTERIOVENOUS (AV) GORE-TEX GRAFT;  Surgeon: Chuck Hint, MD;  Location: [facility] OR;  Service: Vascular;  Laterality: Left;   CATARACT EXTRACTION, BILATERAL  2018   INSERTION OF DIALYSIS CATHETER Right 12/27/2022   Procedure: INSERTION OF Right Internal Jugular  DIALYSIS CATHETER;  Surgeon: Chuck Hint, MD;  Location: [facility] OR;  Service: Vascular;  Laterality: Right;   TONSILLECTOMY     Social History:  reports that he has never smoked. He has never used smokeless tobacco. He reports that he does not drink alcohol and does not use drugs.  No Known Allergies  Family History  Problem Relation Age of Onset   Hypertension Mother    Diabetes Mother    Cancer Father        prostate cancer   Hypertension Maternal Grandfather    Rheum arthritis Maternal Grandmother    CAD Neg Hx     Prior to Admission medications   Medication Sig Start Date End Date Taking? Authorizing Provider  Accu-Chek Softclix Lancets lancets  08/06/20   [provider]  acetaminophen (TYLENOL) 500 MG tablet Take 500 mg by mouth every 6 (six) hours as needed (arthritis).     [provider]  amitriptyline (ELAVIL) 10 MG tablet Take 10 mg  by mouth at bedtime. 05/24/22   [provider]  amLODipine (NORVASC) 5 MG tablet Take 5 mg by mouth every morning.    [provider]  atorvastatin (LIPITOR) 20 MG tablet Take 20 mg by mouth every morning. 09/23/20   [provider]  Blood Glucose Monitoring Suppl (ACCU-CHEK GUIDE) w/Device KIT in the morning. 08/06/20   [provider]  famotidine (PEPCID) 20 MG tablet Take 1 tablet (20 mg total) by mouth 2 (two) times daily as needed for heartburn or indigestion. 01/08/18   Jacquelin Hawking, PA-C  gabapentin (NEURONTIN) 100 MG capsule Take 100 mg by mouth at bedtime.     [provider]  hydrALAZINE (APRESOLINE) 25 MG tablet Take 1 tablet (25 mg total) by mouth every 8 (eight) hours. Patient taking differently: Take 50 mg by mouth 2 (two) times daily. 09/10/20   Lewie Chamber, MD  HYDROcodone-acetaminophen (NORCO) 5-325 MG tablet Take 1 tablet by mouth every 6 (six) hours as needed for moderate pain. Patient not taking: Reported on 01/09/2023 12/27/22   Emilie Rutter, PA-C  levothyroxine (SYNTHROID) 50 MCG tablet Take 50 mcg by mouth daily before breakfast. 02/18/21   [provider]  Methoxy PEG-Epoetin Beta (MIRCERA IJ) Mircera Patient not taking: Reported on 01/09/2023 01/02/23 01/01/24  [provider]  metoprolol tartrate (LOPRESSOR) 50 MG tablet Take 1 tablet (50 mg total) by mouth 2 (two) times daily. 06/16/20   Jacquelin Hawking, PA-C  vitamin B-12 (CYANOCOBALAMIN) 1000 MCG tablet Take 1,000 mcg by mouth at bedtime.    [provider]    Physical Exam: Vitals:   01/11/23 2315 01/11/23 2345 01/12/23 0039 01/12/23 0101  BP: 138/65 (!) 144/74  (!) 133/58  Pulse: (!) 57 (!) 57  (!) 57  Resp: 17 12    Temp:   (!) 97.5 F (36.4 C) 97.8 F (36.6 C)  TempSrc:   Oral Oral  SpO2: 98% 99%  100%  Weight:    68.9 kg  Height:    5\' 10"  (1.778 m)   1.  General: Patient lying supine in bed,  no acute distress   2. Psychiatric: Somnolent, not participating with questions at this time  3. Neurologic: Nonverbal at this time, face is symmetric, moves all 4 extremities voluntarily, at baseline without acute deficits on limited exam   4. HEENMT:  Head is atraumatic, normocephalic, pupils reactive to light, neck is supple, trachea is midline, mucous membranes are moist   5. Respiratory : Lungs are clear to auscultation bilaterally without wheezing, rhonchi, rales, no cyanosis, no increase in work of breathing or accessory muscle use   6. Cardiovascular : Heart rate normal, rhythm is regular, murmur present, rubs or gallops, no  peripheral edema, peripheral pulses palpated   7. Gastrointestinal:  Abdomen is soft, minimally distended, but nontender to palpation bowel sounds active, no masses or organomegaly palpated   8. Skin:  Skin is warm, dry and intact without rashes, acute lesions, or ulcers on limited exam   9.Musculoskeletal:  No acute deformities or trauma, no asymmetry in tone, no peripheral edema, peripheral pulses palpated, no tenderness to palpation in the extremities  Data Reviewed: In the ED Temp 97.8, heart rate 57-63, respiratory rate 15-17, blood pressure 105/56-167/66, satting 98-100% Leukocytosis 12.1 Procalcitonin indeterminate Hemoglobin stable 9.7 Chemistry reveals a hypokalemia 3.4, creatinine 5.47 in this ESRD patient Lactic acid initially 2.5 but corrects to 1.3 on repeat Patient was typed and screened CT abdomen pelvis shows proctitis, and a change in the  pancreas that should be followed up with outpatient pancreatic MRI ED consulted GI Admission requested for GI bleed  Assessment and Plan: * GI bleed 2 ER visits in 1 day - Hemoglobin stable 9.5, 9.7 between the 2 visits - Continue to monitor H&H - Protonix twice daily - GI consulted from the ER - Monitor intake and output - Hold pharmaceutical DVT prophylaxis - Continue to monitor  ESRD (end stage renal disease) (HCC) - Creatinine increasing from 5.08>> 5.47 between same-day ER visits - Looks like baseline creatinine is all over the place - Evaluated for renal transplant and declined due to functional status - He was not a candidate for fistula in December 27, 2022 due to MAP - Insertion of left arm AV Gore-Tex graft on July 16 - Consult nephro -Defer electrolyte and volume management to nephro   Hypothyroidism - Continue Synthroid  Proctitis - As seen on CT scan - Rocephin and Flagyl started in the ED - Procalcitonin indeterminate - Leukocytosis 12.1 - Continue Rocephin - Continue to monitor  Hypokalemia Defer  electrolyte management to nephro  Hyperlipidemia associated with type 2 diabetes mellitus (HCC) - Glucose 172 - Sliding scale coverage - Continue statin - Continue to monitor      Advance Care Planning:   Code Status: Full Code  Consults: Nephro and GI  Family Communication: No family at bedside  Severity of Illness: The appropriate patient status for this patient is OBSERVATION. Observation status is judged to be reasonable and necessary in order to provide the required intensity of service to ensure the patient's safety. The patient's presenting symptoms, physical exam findings, and initial radiographic and laboratory data in the context of their medical condition is felt to place them at decreased risk for further clinical deterioration. Furthermore, it is anticipated that the patient will be medically stable for discharge from the hospital within 2 midnights of admission.   Author: Lilyan Gilford, DO 01/12/2023 5:27 AM  For on call review [email or web address].

## 2023-01-12 NOTE — Progress Notes (Signed)
This nurse called phlebotomy to verify when the next H&H was to be drawn. Phlebotomy states 1700. This nurse asked phlebotomy to see if they could draw sooner due to blood being finished. MD [facility] notified.

## 2023-01-12 NOTE — Progress Notes (Signed)
HD cath dressing change performed

## 2023-01-12 NOTE — Assessment & Plan Note (Addendum)
-  Defer electrolyte management to nephro -Potassium 3.1 -Patient will have dialysis today.

## 2023-01-12 NOTE — Consult Note (Signed)
ESRD Consult Note  Reason for consult: ESRD, provision of dialysis  Assessment/Recommendations:  ESRD -outpatient HD orders: [facility].  MWF.  4 hours.  EDW 71.2 kg.  F180. Flow rates: 400/autoflow 1.5.  3K/2.5 calcium.  TDC, AVG maturing.  Meds: Venofer 50 mg once weekly, Mircera 75 mcg every 2 weeks (last dose 7/22), heparin 5000 units bolus every treatment and with Hep-Lock.  Has had numerous missed treatments, last dialysis was on 7/30 (make-up treatment) -HD tomorrow on MWF schedule, no urgent indications for dialysis today.  No heparin w/ HD  GI bleed, proctitis Anemia of chronic kidney disease -GI on board -Transfuse as needed for hemoglobin less than 7. Serial H&H per primary -Fe panel ordered  Volume/ hypertension  -Will UF as tolerated  Secondary Hyperparathyroidism/Hyperphosphatemia -not on VDRA or binders as outpatient. Monitor PO4 for now. Last PTH 86 on 7/10   Cystic pancreatic lesions -per primary service  Hypokalemia -3k bath with HD  Hyponatremia, mild -managing with HD, 137Na bath  Vascular access - currently with [facility] (recently exchanged), s/p LUE AVG on 7/16 with Dr. Genelle Bal.  Recommendations were discussed with the primary team.  Anthony Sar, MD [facility]  History of Present Illness: [name] [name] is a/an 62 y.o. male with a past medical history of ESRD, DM2, hypertension, cardiomyopathy, chronic anemia, pulmonary hypertension, hyperlipidemia who presents with rectal bleeding.  He did have an ER at [facility] presentation earlier that day for diarrhea and was discharged.  Shortly after his discharge, had a large volume of bright red blood per rectum.  Apparently per EMS there was a trash bag full of stool bloody towels on scene.  CT abdomen pelvis without contrast in ER showed circumferential rectal wall thickening with perirectal fat stranding which could reflect proctitis, incidental cystic lesion within the pancreas superimposed background  fatty pancreatic atrophy, small bilateral pleural effusions, indeterminate 1.4 cm hypodensity left lobe liver. Patient seen and examined bedside. No complaints on my arrival. He says he wasn't going to do dialysis tomorrow on account of his bleeding but did inform him that we will be doing his dialysis while he's here. He denies any chest pain, SOB, issues with LUE AVG or TDC, abdominal pain, diarrhea currently.   Medications:  Current Facility-Administered Medications  Medication Dose Route Frequency Provider Last Rate Last Admin   acetaminophen (TYLENOL) tablet 650 mg  650 mg Oral Q6H PRN Zierle-Ghosh, Asia B, DO       Or   acetaminophen (TYLENOL) suppository 650 mg  650 mg Rectal Q6H PRN Zierle-Ghosh, Asia B, DO       amitriptyline (ELAVIL) tablet 10 mg  10 mg Oral QHS Zierle-Ghosh, Asia B, DO   10 mg at 01/12/23 0212   atorvastatin (LIPITOR) tablet 20 mg  20 mg Oral q morning Zierle-Ghosh, Asia B, DO       cefTRIAXone (ROCEPHIN) 2 g in sodium chloride 0.9 % 100 mL IVPB  2 g Intravenous Q24H Zierle-Ghosh, Asia B, DO       famotidine (PEPCID) tablet 20 mg  20 mg Oral BID PRN Zierle-Ghosh, Asia B, DO       gabapentin (NEURONTIN) capsule 100 mg  100 mg Oral QHS Zierle-Ghosh, Asia B, DO   100 mg at 01/12/23 0213   insulin aspart (novoLOG) injection 0-15 Units  0-15 Units Subcutaneous TID WC Zierle-Ghosh, Asia B, DO       insulin aspart (novoLOG) injection 0-5 Units  0-5 Units Subcutaneous QHS Zierle-Ghosh, Asia B, DO  levothyroxine (SYNTHROID) tablet 50 mcg  50 mcg Oral Q0600 Zierle-Ghosh, Asia B, DO   50 mcg at 01/12/23 0529   metoprolol tartrate (LOPRESSOR) tablet 50 mg  50 mg Oral BID Zierle-Ghosh, Asia B, DO       ondansetron (ZOFRAN) tablet 4 mg  4 mg Oral Q6H PRN Zierle-Ghosh, Asia B, DO       Or   ondansetron (ZOFRAN) injection 4 mg  4 mg Intravenous Q6H PRN Zierle-Ghosh, Asia B, DO       oxyCODONE (Oxy IR/ROXICODONE) immediate release tablet 5 mg  5 mg Oral Q4H PRN Zierle-Ghosh,  Asia B, DO       pantoprazole (PROTONIX) injection 40 mg  40 mg Intravenous Q12H Zierle-Ghosh, Asia B, DO   40 mg at 01/12/23 0212     ALLERGIES Patient has no known allergies.  MEDICAL HISTORY Past Medical History:  Diagnosis Date   Anemia    Arthritis    Cardiomyopathy [facility])    July 2022   Diabetes mellitus (HCC)    GERD (gastroesophageal reflux disease)    HTN (hypertension)    Hyperlipidemia    Nephrotic syndrome    Obesity    Pulmonary hypertension (HCC)    July 2022 in setting of nephrotic syndrome   Secondary hyperparathyroidism of renal origin [facility])      SOCIAL HISTORY Social History   Socioeconomic History   Marital status: Single    Spouse name: Not on file   Number of children: Not on file   Years of education: Not on file   Highest education level: Not on file  Occupational History   Not on file  Tobacco Use   Smoking status: Never   Smokeless tobacco: Never  Vaping Use   Vaping status: Never Used  Substance and Sexual Activity   Alcohol use: Never   Drug use: Never   Sexual activity: Not on file  Other Topics Concern   Not on file  Social History Narrative   Not on file   Social Determinants of Health   Financial Resource Strain: Not on file  Food Insecurity: No Food Insecurity (01/12/2023)   Hunger Vital Sign    Worried About Running Out of Food in the Last Year: Never true    Ran Out of Food in the Last Year: Never true  Transportation Needs: No Transportation Needs (01/12/2023)   PRAPARE - Administrator, Civil Service (Medical): No    Lack of Transportation (Non-Medical): No  Physical Activity: Not on file  Stress: Not on file  Social Connections: Not on file  Intimate Partner Violence: Not At Risk (01/12/2023)   Humiliation, Afraid, Rape, and Kick questionnaire    Fear of Current or Ex-Partner: No    Emotionally Abused: No    Physically Abused: No    Sexually Abused: No     FAMILY HISTORY Family History  Problem Relation  Age of Onset   Hypertension Mother    Diabetes Mother    Cancer Father        prostate cancer   Hypertension Maternal Grandfather    Rheum arthritis Maternal Grandmother    CAD Neg Hx      Review of Systems: 12 systems were reviewed and negative except per HPI  Physical Exam: Vitals:   01/12/23 0101 01/12/23 0549  BP: (!) 133/58 133/68  Pulse: (!) 57 (!) 57  Resp:  16  Temp: 97.8 F (36.6 C) 98.2 F (36.8 C)  SpO2: 100%  No intake/output data recorded.  Intake/Output Summary (Last 24 hours) at 01/12/2023 0831 Last data filed at 01/12/2023 0348 Gross per 24 hour  Intake 200 ml  Output --  Net 200 ml   General: well-appearing, no acute distress HEENT: anicteric sclera, MMM CV: normal rate, no murmurs, no edema Lungs: bilateral chest rise, normal wob, CTA B/L Abd: soft, non-tender, non-distended Skin: no visible lesions or rashes Neuro: normal speech, no gross focal deficits  Dialysis access: TDC, LUE AVG +b/t (maturing)  Test Results Reviewed Lab Results  Component Value Date   NA 132 (L) 01/12/2023   K 3.0 (L) 01/12/2023   CL 98 01/12/2023   CO2 25 01/12/2023   BUN 20 01/12/2023   CREATININE 5.85 (H) 01/12/2023   CALCIUM 8.3 (L) 01/12/2023   ALBUMIN 2.2 (L) 01/12/2023   PHOS 4.8 (H) 09/10/2020    I have reviewed relevant outside healthcare records

## 2023-01-12 NOTE — Plan of Care (Signed)
  Problem: Education: Goal: Knowledge of General Education information will improve Description Including pain rating scale, medication(s)/side effects and non-pharmacologic comfort measures Outcome: Progressing   Problem: Health Behavior/Discharge Planning: Goal: Ability to manage health-related needs will improve Outcome: Progressing   

## 2023-01-12 NOTE — Consult Note (Signed)
Gastroenterology Consult   Referring Provider: No ref. provider found Primary Care Physician:  Farris Has, MD Primary Gastroenterologist:  previously unassigned, Vista Lawman, MD  Patient ID: [name] [name]; [id number]; 06-Oct-1960   Admit date: 01/11/2023  LOS: 0 days   Date of Consultation: 01/12/2023  Reason for Consultation: GI bleed    History of Present Illness   [name] [name] is a 62 y.o. male with cardiomyopathy, DM, GERD, HTN, pulmonary HTN, ESRD on HD, recent insertion of dialysis catheter and new left upper arm AV graft on 12/27/22 presenting to ED second time yesterday, third ED evaluation for the week. GI consulted for rectal bleeding.  Reportedly recently starting dialysis about one month ago. Presented to ED 01/09/23 with complains of weakness, ?constipation for several days although reported he took imodium without relief. He had not had dialysis in about a week. Creatinine 8.85, Na 129, K 3.3, Hgb 9.1, he was discharged to follow up with dialysis the following day. Reportedly advised a laxative, patient difficult historian, not clear what he took if anything. He presented back to ED yesterday morning, evaluation prompted by mother, complaining of weakness, somnolence, diarrhea (but noted constipation on Monday and took laxative). He had received Norco for pain following surgical procedure on 12/27/22. Patient complaining of rectal pain and on exam noted anal fissure. Also noted poor anal hygiene. Felt to be stable for discharge. Presented back to ED last night with large volume brbpr. EMS noted trash bag full of soiled bloody towels on scene. Patient had ongoing rectal bleeding during transit. Blood pressures stable on presentation.  In the ED: BP 105/56, pulse 59. Hgb 9.7-->7.2-->6.5, WBC 12.1, Platelet 252, K 3.4, Na 133, BUN 14, Creatinine 5.47, lactic acid 2.5-->1.3  CT A/P without contrast showed circumferential rectal wall thickening with perirectal fat  stranding. Also with incidental cystic lesions within pancreas, superimposed upon background fatty pancreatic atrophy, largest in pancreatic tail measures 12mm. 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 Indeterminate 1.4cm hypodensity left lobe liver, favor cyst or hemangioma, reassess with surveillance imaging.  Today: patient called historian.  He states he has not had any further bleeding overnight.  Nursing staff says no stool yet today.  At 6 AM documented stool, type 7, red.  Patient denies abdominal pain, vomiting, heartburn.  Denies dysphagia.  Denies black stools.  Denies rectal pain.  Acknowledges recent constipation.  States he did take a laxative.  Says he was supposed to have a colonoscopy for kidney transplant evaluation, according to epic note from July 18 he did not meet criteria for candidacy for transplant due to functional status.  At home patient ambulates with a walker or cane, uses wheelchair for distance.  No prior colonoscopy.  Prior to Admission medications   Medication Sig Start Date End Date Taking? Authorizing Provider  Accu-Chek Softclix Lancets lancets  08/06/20   [provider]  acetaminophen (TYLENOL) 500 MG tablet Take 500 mg by mouth every 6 (six) hours as needed (arthritis).     [provider]  amitriptyline (ELAVIL) 10 MG tablet Take 10 mg by mouth at bedtime. 05/24/22   [provider]  amLODipine (NORVASC) 5 MG tablet Take 5 mg by mouth every morning.    [provider]  atorvastatin (LIPITOR) 20 MG tablet Take 20 mg by mouth every morning. 09/23/20   [provider]  Blood Glucose Monitoring Suppl (ACCU-CHEK GUIDE) w/Device KIT in the morning. 08/06/20   [provider]  famotidine (PEPCID) 20 MG tablet  Take 1 tablet (20 mg total) by mouth 2 (two) times daily as needed for heartburn or indigestion. 01/08/18   Jacquelin Hawking, PA-C  gabapentin (NEURONTIN) 100 MG capsule Take 100 mg by mouth at  bedtime.    [provider]  hydrALAZINE (APRESOLINE) 25 MG tablet Take 1 tablet (25 mg total) by mouth every 8 (eight) hours. Patient taking differently: Take 50 mg by mouth 2 (two) times daily. 09/10/20   Lewie Chamber, MD  HYDROcodone-acetaminophen (NORCO) 5-325 MG tablet Take 1 tablet by mouth every 6 (six) hours as needed for moderate pain. Patient not taking: Reported on 01/09/2023 12/27/22   Emilie Rutter, PA-C  levothyroxine (SYNTHROID) 50 MCG tablet Take 50 mcg by mouth daily before breakfast. 02/18/21   [provider]  Methoxy PEG-Epoetin Beta (MIRCERA IJ) Mircera Patient not taking: Reported on 01/09/2023 01/02/23 01/01/24  [provider]  metoprolol tartrate (LOPRESSOR) 50 MG tablet Take 1 tablet (50 mg total) by mouth 2 (two) times daily. 06/16/20   Jacquelin Hawking, PA-C  vitamin B-12 (CYANOCOBALAMIN) 1000 MCG tablet Take 1,000 mcg by mouth at bedtime.    [provider]    Current Facility-Administered Medications  Medication Dose Route Frequency Provider Last Rate Last Admin   acetaminophen (TYLENOL) tablet 650 mg  650 mg Oral Q6H PRN Zierle-Ghosh, Asia B, DO       Or   acetaminophen (TYLENOL) suppository 650 mg  650 mg Rectal Q6H PRN Zierle-Ghosh, Asia B, DO       amitriptyline (ELAVIL) tablet 10 mg  10 mg Oral QHS Zierle-Ghosh, Asia B, DO   10 mg at 01/12/23 0212   atorvastatin (LIPITOR) tablet 20 mg  20 mg Oral q morning Zierle-Ghosh, Asia B, DO       cefTRIAXone (ROCEPHIN) 2 g in sodium chloride 0.9 % 100 mL IVPB  2 g Intravenous Q24H Zierle-Ghosh, Asia B, DO       famotidine (PEPCID) tablet 20 mg  20 mg Oral BID PRN Zierle-Ghosh, Asia B, DO       gabapentin (NEURONTIN) capsule 100 mg  100 mg Oral QHS Zierle-Ghosh, Asia B, DO   100 mg at 01/12/23 0213   insulin aspart (novoLOG) injection 0-15 Units  0-15 Units Subcutaneous TID WC Zierle-Ghosh, Asia B, DO       insulin aspart (novoLOG) injection 0-5 Units  0-5 Units Subcutaneous QHS  Zierle-Ghosh, Asia B, DO       levothyroxine (SYNTHROID) tablet 50 mcg  50 mcg Oral Q0600 Zierle-Ghosh, Asia B, DO   50 mcg at 01/12/23 0529   metoprolol tartrate (LOPRESSOR) tablet 50 mg  50 mg Oral BID Zierle-Ghosh, Asia B, DO       ondansetron (ZOFRAN) tablet 4 mg  4 mg Oral Q6H PRN Zierle-Ghosh, Asia B, DO       Or   ondansetron (ZOFRAN) injection 4 mg  4 mg Intravenous Q6H PRN Zierle-Ghosh, Asia B, DO       oxyCODONE (Oxy IR/ROXICODONE) immediate release tablet 5 mg  5 mg Oral Q4H PRN Zierle-Ghosh, Asia B, DO       pantoprazole (PROTONIX) injection 40 mg  40 mg Intravenous Q12H Zierle-Ghosh, Asia B, DO   40 mg at 01/12/23 0212    Allergies as of 01/11/2023   (No Known Allergies)    Past Medical History:  Diagnosis Date   Anemia    Arthritis    Cardiomyopathy [facility])    July 2022   Diabetes mellitus (HCC)    GERD (gastroesophageal  reflux disease)    HTN (hypertension)    Hyperlipidemia    Nephrotic syndrome    Obesity    Pulmonary hypertension (HCC)    July 2022 in setting of nephrotic syndrome   Secondary hyperparathyroidism of renal origin [facility])     Past Surgical History:  Procedure Laterality Date   AV FISTULA PLACEMENT Left 12/27/2022   Procedure: INSERTION OF LEFT ARM ARTERIOVENOUS (AV) GORE-TEX GRAFT;  Surgeon: Chuck Hint, MD;  Location: [facility] OR;  Service: Vascular;  Laterality: Left;   CATARACT EXTRACTION, BILATERAL  2018   INSERTION OF DIALYSIS CATHETER Right 12/27/2022   Procedure: INSERTION OF Right Internal Jugular  DIALYSIS CATHETER;  Surgeon: Chuck Hint, MD;  Location: [facility] OR;  Service: Vascular;  Laterality: Right;   TONSILLECTOMY      Family History  Problem Relation Age of Onset   Hypertension Mother    Diabetes Mother    Cancer Father        prostate cancer   Hypertension Maternal Grandfather    Rheum arthritis Maternal Grandmother    CAD Neg Hx     Social History   Socioeconomic History   Marital status: Single    Spouse  name: Not on file   Number of children: Not on file   Years of education: Not on file   Highest education level: Not on file  Occupational History   Not on file  Tobacco Use   Smoking status: Never   Smokeless tobacco: Never  Vaping Use   Vaping status: Never Used  Substance and Sexual Activity   Alcohol use: Never   Drug use: Never   Sexual activity: Not on file  Other Topics Concern   Not on file  Social History Narrative   Not on file   Social Determinants of Health   Financial Resource Strain: Not on file  Food Insecurity: No Food Insecurity (01/12/2023)   Hunger Vital Sign    Worried About Running Out of Food in the Last Year: Never true    Ran Out of Food in the Last Year: Never true  Transportation Needs: No Transportation Needs (01/12/2023)   PRAPARE - Administrator, Civil Service (Medical): No    Lack of Transportation (Non-Medical): No  Physical Activity: Not on file  Stress: Not on file  Social Connections: Not on file  Intimate Partner Violence: Not At Risk (01/12/2023)   Humiliation, Afraid, Rape, and Kick questionnaire    Fear of Current or Ex-Partner: No    Emotionally Abused: No    Physically Abused: No    Sexually Abused: No     Review of System:   General: Negative for anorexia, weight loss, fever, chills, fatigue, positive weakness. Eyes: Negative for vision changes.  ENT: Negative for hoarseness, difficulty swallowing , nasal congestion. CV: Negative for chest pain, angina, palpitations, dyspnea on exertion, peripheral edema.  Respiratory: Negative for dyspnea at rest, dyspnea on exertion, cough, sputum, wheezing.  GI: See history of present illness. GU:  Negative for dysuria, hematuria, urinary incontinence, urinary frequency, nocturnal urination.  MS: Negative for joint pain, low back pain.  Derm: Negative for rash or itching.  Neuro: Negative for weakness, abnormal sensation, seizure, frequent headaches, memory loss, confusion.   Psych: Negative for anxiety, depression, suicidal ideation, hallucinations.  Endo: Negative for unusual weight change.  Heme: Negative for bruising or bleeding. Allergy: Negative for rash or hives.      Physical Examination:   Vital signs in last 24  hours: Temp:  [97.5 F (36.4 C)-98.2 F (36.8 C)] 98.2 F (36.8 C) (08/01 0549) Pulse Rate:  [57-63] 57 (08/01 0549) Resp:  [12-17] 16 (08/01 0549) BP: (105-167)/(56-74) 133/68 (08/01 0549) SpO2:  [98 %-100 %] 100 % (08/01 0101) Weight:  [68.9 kg-77.1 kg] 68.9 kg (08/01 0101) Last BM Date : 01/11/23  General: Appears older than stated age, no acute distress  head: Normocephalic, atraumatic.   Eyes: Conjunctiva pink, no icterus. Mouth: Oropharyngeal mucosa moist and pink   Neck: Supple without thyromegaly, masses, or lymphadenopathy.  Lungs: Clear to auscultation bilaterally.  Heart: Regular rate and rhythm, no murmurs rubs or gallops.  Abdomen: Bowel sounds are normal, nontender, nondistended, no hepatosplenomegaly or masses, no abdominal bruits or hernia , no rebound or guarding.   Rectal: not performed Extremities: No lower extremity edema, clubbing, deformity.  Neuro: Alert and oriented x 4 , grossly normal neurologically.  Skin: Warm and dry, no rash or jaundice.   Psych: Alert and cooperative, normal mood and affect.        Intake/Output from previous day: 07/31 0701 - 08/01 0700 In: 200 [IV Piggyback:200] Out: -  Intake/Output this shift: No intake/output data recorded.  Lab Results:   CBC Recent Labs    01/09/23 1205 01/11/23 1220 01/11/23 2104 01/11/23 2349  WBC 10.9* 10.8* 12.1*  --   HGB 9.1* 9.5* 9.7* 7.2*  HCT 29.0* 30.1* 31.3* 23.1*  MCV 88.7 88.5 90.7  --   PLT 239 258 252  --    BMET Recent Labs    01/09/23 1205 01/11/23 1220 01/11/23 2104  NA 129* 135 133*  K 3.3* 3.0* 3.4*  CL 98 99 97*  CO2 22 26 24   GLUCOSE 114* 115* 172*  BUN 36* 11 14  CREATININE 8.85* 5.08* 5.47*  CALCIUM 8.7*  8.7* 9.4   LFT Recent Labs    01/09/23 1205 01/11/23 1220 01/11/23 2104  BILITOT 1.0 1.0 0.8  BILIDIR 0.2  --   --   IBILI 0.8  --   --   ALKPHOS 108 115 118  AST 13* 31 31  ALT 9 12 14   PROT 5.9* 5.7* 6.2*  ALBUMIN 2.5* 2.5* 2.7*    Lipase Recent Labs    01/11/23 1220 01/11/23 2104  LIPASE 36 40    PT/INR No results for input(s): "LABPROT", "INR" in the last 72 hours.   Hepatitis Panel No results for input(s): "HEPBSAG", "HCVAB", "HEPAIGM", "HEPBIGM" in the last 72 hours.   Imaging Studies:   CT ABDOMEN PELVIS WO CONTRAST  Result Date: 01/11/2023 CLINICAL DATA:  Rectal bleeding, anal fissure EXAM: CT ABDOMEN AND PELVIS WITHOUT CONTRAST TECHNIQUE: Multidetector CT imaging of the abdomen and pelvis was performed following the standard protocol without IV contrast. Unenhanced CT was performed per clinician order. Lack of IV contrast limits sensitivity and specificity, especially for evaluation of abdominal/pelvic solid viscera. RADIATION DOSE REDUCTION: This exam was performed according to the departmental dose-optimization program which includes automated exposure control, adjustment of the mA and/or kV according to patient size and/or use of iterative reconstruction technique. COMPARISON:  None Available. FINDINGS: Lower chest: Small bilateral pleural effusions. No acute airspace disease. Hepatobiliary: Indeterminate 1.4 cm hypodensity left lobe liver, likely a cyst or hemangioma. Gallbladder is unremarkable. No biliary duct dilation. Pancreas: There is diffuse fatty atrophy of the pancreas. Multiple small cystic areas are seen throughout the pancreas, greatest in the head and body. Largest in the head measures 9 mm and largest in the tail measures  12 mm. No acute inflammatory change. Spleen: Unremarkable unenhanced appearance. Adrenals/Urinary Tract: The adrenals are unremarkable. No urinary tract calculi or obstructive uropathy within either kidney. The bladder is moderately  distended with no filling defect. Stomach/Bowel: No bowel obstruction or ileus. Normal appendix right lower quadrant. There is circumferential wall thickening of the rectum, with mild perirectal fat stranding. Findings are consistent with proctitis. The anal verge and perineum are not included on this exam due to slice selection, and the reported anal fissure is not well visualized. Vascular/Lymphatic: Aortic atherosclerosis. No enlarged abdominal or pelvic lymph nodes. Reproductive: Prostate is unremarkable. Other: No free intraperitoneal fluid or free gas. No abdominal wall hernia. Musculoskeletal: No acute or destructive bony abnormalities. IMPRESSION: 1. Circumferential rectal wall thickening with perirectal fat stranding, which could reflect proctitis given clinical presentation. Follow-up is recommended to document resolution and exclude underlying neoplasm. 2. Incidental cystic lesions within the pancreas, superimposed upon background fatty pancreatic atrophy. Largest in the pancreatic tail measures 12 mm. 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 3. Small bilateral pleural effusions. 4. Indeterminate 1.4 cm hypodensity left lobe liver, favor a cyst or hemangioma. This could be reassessed at the time of follow-up imaging for the pancreas. 5.  Aortic Atherosclerosis (ICD10-I70.0). Electronically Signed   By: Sharlet Salina M.D.   On: 01/11/2023 22:48   DG Abdomen Acute W/Chest  Result Date: 01/09/2023 CLINICAL DATA:  consiptation, weakness EXAM: DG ABDOMEN ACUTE WITH 1 VIEW CHEST COMPARISON:  12/27/2022. FINDINGS: There is no evidence of dilated bowel loops or free intraperitoneal air. No radiopaque calculi or other significant radiographic abnormality is seen. Heart size and mediastinal contours are within normal limits. Both lungs are clear. Note is made of elevated right hemidiaphragm. Right IJ hemodialysis  catheter again seen with its tip overlying the cavoatrial junction region. IMPRESSION: Negative abdominal radiographs.  No acute cardiopulmonary disease. Electronically Signed   By: Jules Schick M.D.   On: 01/09/2023 12:52   DG CHEST PORT 1 VIEW  Result Date: 12/27/2022 CLINICAL DATA:  Status post dialysis catheter placement. EXAM: PORTABLE CHEST 1 VIEW COMPARISON:  September 07, 2020. FINDINGS: Stable cardiomediastinal silhouette. Interval placement of right internal jugular dialysis catheter with distal tip in expected position of cavoatrial junction. No pneumothorax is noted. Lungs are clear. Bony thorax is unremarkable. IMPRESSION: Interval placement of right internal jugular dialysis catheter as described above. Electronically Signed   By: Lupita Raider M.D.   On: 12/27/2022 16:12   DG C-Arm 1-60 Min-No Report  Result Date: 12/27/2022 Fluoroscopy was utilized by the requesting physician.  No radiographic interpretation.  Pierre.Alas week]  Assessment:   62 y/o male with cardiomyopathy, DM, GERD, HTN, pulmonary HTN, ESRD on HD, recent insertion of dialysis catheter and new left upper arm AV graft on 12/27/22 presenting to ED second time yesterday, third ED evaluation for the week.  Presented for the second time yesterday due to rectal bleeding.  GI bleeding: Presented with large volume bright red blood per rectum with clots likely due to lower GI bleed.  CT with findings of proctitis.  Cannot exclude stercoral colitis given recent reported constipation requiring laxative use.  He has had a substantial drop in his hemoglobin from 9.5-6.5, typical baseline appears to be in the 8-9 range.  No prior colonoscopy.  Discussed with patient today regarding need for colonoscopy for potential therapeutic purposes as well as diagnostic reasons.  Cannot exclude malignancy or other life-threatening etiologies.  At this time he is not consenting for colonoscopy therefore will not start bowel prep.  He is alert and oriented  today although it is not clear that he has good healthcare literacy.  Will discuss with attending.  Continue blood transfusions as needed to keep hemoglobin above 7.  Cystic lesions within the pancreas: Incidentally found on CT.  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  Left lobe liver lesion: Incidentally found on CT.  Can reassess at the same time pancreatic lesions are reevaluated.  Plan:   Recommending colonoscopy, patient has requested to think about it.  Will follow-up with him later today and if agreeable we will start bowel prep. Outpatient follow-up of pancreatic and liver lesions at a later date. Keep hemoglobin above 7. Patient can have clear liquid diet. Agree with PPI for now.    LOS: 0 days   We would like to thank you for the opportunity to participate in the care of [name].  Leanna Battles. Dixon Boos [facility] [phone] 8/1/20247:37 AM

## 2023-01-12 NOTE — Assessment & Plan Note (Addendum)
-   Continue dialysis as per renal service recommendations.

## 2023-01-12 NOTE — Progress Notes (Signed)
Patient admitted during the night from the emergency room, rested during the remainder of the night.  No complaints of pain

## 2023-01-12 NOTE — Progress Notes (Signed)
Patient has a good amount of bright red blood coming from his bottom including clots. Patient has agreed to have a colonoscopy. MD Gwenlyn Perking and [place] notified.

## 2023-01-12 NOTE — Progress Notes (Signed)
Patient seen and examined; admitted after midnight secondary to rectal bleeding.  No chest pain, no nausea, no vomiting; hemoglobin level for the most part stable in the 9.5 range.  GI service has been contacted and will follow recommendations; based on patient's workup there is concern for proctitis; continue current antibiotics.  Please refer to H&P written by Dr. Carren Rang for further info/details on admission.  Plan: -Follow hemoglobin trend and transfuse as needed. -Follow GI service recommendation -Continue current antibiotics -Nephrology service consulted for continuation of dialysis. -Follow clinical response.  Vassie Loll MD [phone]

## 2023-01-12 NOTE — Progress Notes (Signed)
   01/12/23 1209  TOC Brief Assessment  Insurance and Status Reviewed  Patient has primary care physician Yes  Home environment has been reviewed Home with Parents  Prior level of function: needs assistance  Prior/Current Home Services No current home services  Social Determinants of Health Reivew SDOH reviewed no interventions necessary  Readmission risk has been reviewed Yes  Transition of care needs no transition of care needs at this time   Admitted in OBS for GI bleed. Getting blood, HD, DC planning for 1-2 days, TOC following.

## 2023-01-12 NOTE — Progress Notes (Signed)
patient is sitting in a huge amount of blood. vitals are 95/53 map 65, hr 56 o2 100 and temp 98.3. MD [facility] notified.

## 2023-01-12 NOTE — Progress Notes (Addendum)
Patient stated he just can't drink the bowel prep tonight, he stated he feels bad and it makes him gag. He stated he would drink it tomorrow but he just can't today. Dr. Tasia Catchings in gastro notified of patients noncompliance, awaiting orders at this time.   Dr. Tasia Catchings stated procedure will proceed tomorrow, encouragement continued to drink bowel prep.

## 2023-01-12 NOTE — Plan of Care (Signed)

## 2023-01-13 ENCOUNTER — Inpatient Hospital Stay (HOSPITAL_COMMUNITY): Payer: Medicare HMO | Admitting: Registered Nurse

## 2023-01-13 ENCOUNTER — Encounter (HOSPITAL_COMMUNITY): Payer: Self-pay | Admitting: Family Medicine

## 2023-01-13 ENCOUNTER — Encounter (HOSPITAL_COMMUNITY)
Admission: EM | Disposition: A | Discharge status: Home / Self Care | Payer: Self-pay | Source: Home / Self Care | Attending: Internal Medicine

## 2023-01-13 DIAGNOSIS — I5043 Acute on chronic combined systolic (congestive) and diastolic (congestive) heart failure: Secondary | ICD-10-CM | POA: Diagnosis not present

## 2023-01-13 DIAGNOSIS — I10 Essential (primary) hypertension: Secondary | ICD-10-CM | POA: Diagnosis not present

## 2023-01-13 DIAGNOSIS — N186 End stage renal disease: Secondary | ICD-10-CM | POA: Diagnosis not present

## 2023-01-13 DIAGNOSIS — I132 Hypertensive heart and chronic kidney disease with heart failure and with stage 5 chronic kidney disease, or end stage renal disease: Secondary | ICD-10-CM | POA: Diagnosis not present

## 2023-01-13 DIAGNOSIS — K626 Ulcer of anus and rectum: Secondary | ICD-10-CM

## 2023-01-13 DIAGNOSIS — K625 Hemorrhage of anus and rectum: Secondary | ICD-10-CM

## 2023-01-13 DIAGNOSIS — K6289 Other specified diseases of anus and rectum: Secondary | ICD-10-CM | POA: Diagnosis not present

## 2023-01-13 DIAGNOSIS — E039 Hypothyroidism, unspecified: Secondary | ICD-10-CM | POA: Diagnosis not present

## 2023-01-13 HISTORY — PX: COLONOSCOPY WITH PROPOFOL: SHX5780

## 2023-01-13 HISTORY — PX: BIOPSY: SHX5522

## 2023-01-13 LAB — RENAL FUNCTION PANEL
Albumin: 2.1 g/dL — ABNORMAL LOW (ref 3.5–5.0)
Anion gap: 10 (ref 5–15)
BUN: 27 mg/dL — ABNORMAL HIGH (ref 8–23)
CO2: 23 mmol/L (ref 22–32)
Calcium: 7.7 mg/dL — ABNORMAL LOW (ref 8.9–10.3)
Chloride: 100 mmol/L (ref 98–111)
Creatinine, Ser: 6.67 mg/dL — ABNORMAL HIGH (ref 0.61–1.24)
GFR, Estimated: 9 mL/min — ABNORMAL LOW (ref >60)
Glucose, Bld: 89 mg/dL (ref 70–99)
Phosphorus: 3.7 mg/dL (ref 2.5–4.6)
Potassium: 3.1 mmol/L — ABNORMAL LOW (ref 3.5–5.1)
Sodium: 133 mmol/L — ABNORMAL LOW (ref 135–145)

## 2023-01-13 LAB — GLUCOSE, CAPILLARY
Glucose-Capillary: 78 mg/dL (ref 70–99)
Glucose-Capillary: 131 mg/dL — ABNORMAL HIGH (ref 70–99)
Glucose-Capillary: 99 mg/dL (ref 70–99)
Glucose-Capillary: 130 mg/dL — ABNORMAL HIGH (ref 70–99)

## 2023-01-13 SURGERY — COLONOSCOPY WITH PROPOFOL
Anesthesia: General

## 2023-01-13 MED ORDER — LACTATED RINGERS IV SOLN: INTRAVENOUS | Status: DC

## 2023-01-13 MED ORDER — DEXTROSE 50 % IV SOLN
INTRAVENOUS | Status: AC
  Filled 2023-01-13: qty 50

## 2023-01-13 MED ORDER — SODIUM CHLORIDE 0.9 % IV SOLN: INTRAVENOUS | Status: DC

## 2023-01-13 MED ORDER — PHENYLEPHRINE HCL (PRESSORS) 10 MG/ML IV SOLN
INTRAVENOUS | Status: DC | PRN
Start: 2023-01-13 — End: 2023-01-13
  Administered 2023-01-13: 80 ug via INTRAVENOUS

## 2023-01-13 MED ORDER — HEPARIN SODIUM (PORCINE) 1000 UNIT/ML DIALYSIS
1000.0000 [IU] | INTRAMUSCULAR | Status: DC | PRN
  Administered 2023-01-13: 3800 [IU]

## 2023-01-13 MED ORDER — DEXTROSE 50 % IV SOLN
25.0000 mL | Freq: Once | INTRAVENOUS | Status: AC
  Administered 2023-01-13: 25 mL via INTRAVENOUS

## 2023-01-13 MED ORDER — ALTEPLASE 2 MG IJ SOLR: 2.0000 mg | Freq: Once | INTRAMUSCULAR | Status: DC | PRN

## 2023-01-13 MED ORDER — EPHEDRINE SULFATE (PRESSORS) 50 MG/ML IJ SOLN
INTRAMUSCULAR | Status: DC | PRN
  Administered 2023-01-13 (×2): 5 mg via INTRAVENOUS

## 2023-01-13 MED ORDER — PROPOFOL 10 MG/ML IV BOLUS
INTRAVENOUS | Status: DC | PRN
  Administered 2023-01-13: 10 mg via INTRAVENOUS
  Administered 2023-01-13: 20 mg via INTRAVENOUS
  Administered 2023-01-13: 40 mg via INTRAVENOUS
  Administered 2023-01-13: 20 mg via INTRAVENOUS
  Administered 2023-01-13: 10 mg via INTRAVENOUS

## 2023-01-13 MED ORDER — LIDOCAINE HCL (CARDIAC) PF 100 MG/5ML IV SOSY
PREFILLED_SYRINGE | INTRAVENOUS | Status: DC | PRN
  Administered 2023-01-13: 40 mg via INTRAVENOUS

## 2023-01-13 NOTE — Op Note (Signed)
[facility] Patient Name: [name] [name] Procedure Date: 01/13/2023 7:32 AM MRN: [id number] Date of Birth: [birth year] Attending MD: Sanjuan Dame , MD, [id number] CSN: [id number] Age: 62 Admit Type: Inpatient Procedure:                Colonoscopy Indications:              Hematochezia Providers:                Sanjuan Dame, MD, Angelica Ran, Dyann Ruddle,                            Elinor Parkinson Referring MD:              Medicines:                Monitored Anesthesia Care Complications:            No immediate complications. Estimated Blood Loss:     Estimated blood loss was minimal. Procedure:                Pre-Anesthesia Assessment:                           - Prior to the procedure, a History and Physical                            was performed, and patient medications and                            allergies were reviewed. The patient's tolerance of                            previous anesthesia was also reviewed. The risks                            and benefits of the procedure and the sedation                            options and risks were discussed with the patient.                            All questions were answered, and informed consent                            was obtained. Prior Anticoagulants: The patient has                            taken no anticoagulant or antiplatelet agents. ASA                            Grade Assessment: III - A patient with severe                            systemic disease. After reviewing the risks and                            benefits,  the patient was deemed in satisfactory                            condition to undergo the procedure.                           After obtaining informed consent, the colonoscope                            was passed under direct vision. Throughout the                            procedure, the patient's blood pressure, pulse, and                            oxygen saturations were monitored  continuously. The                            PCF-HQ190L ([id number]) scope was introduced through                            the anus and advanced to the the cecum, identified                            by appendiceal orifice and ileocecal valve. The                            colonoscopy was performed without difficulty. The                            patient tolerated the procedure well. The quality                            of the bowel preparation was evaluated using the                            BBPS [facility] Bowel Preparation Scale) with scores                            of: Right Colon = 1 (portion of mucosa seen, but                            other areas not well seen due to staining, residual                            stool and/or opaque liquid), Transverse Colon = 1                            (portion of mucosa seen, but other areas not well                            seen due to staining, residual stool and/or opaque  liquid) and Left Colon = 1 (portion of mucosa seen,                            but other areas not well seen due to staining,                            residual stool and/or opaque liquid). The total                            BBPS score equals 3. The quality of the bowel                            preparation was inadequate. Scope In: 7:44:02 AM Scope Out: 8:01:13 AM Scope Withdrawal Time: 0 hours 9 minutes 53 seconds  Total Procedure Duration: 0 hours 17 minutes 11 seconds  Findings:      The digital rectal exam findings include palpable rectal ulceration.      A large amount of stool was found in the entire colon, precluding       visualization. Lavage of the area was performed using a large amount of       sterile water, resulting in incomplete clearance with continued poor       visualization.      A continuous area of nonbleeding ulcerated mucosa with stigmata of       recent bleeding was present in the rectum. Biopsies were taken  with a       cold forceps for histology. Impression:               - Preparation of the colon was inadequate.                           - Palpable rectal ulcer found on digital rectal                            exam on the posterior wall.                           - Stool in the entire examined colon.                           -Cratered large Mucosal ulceration in the rectum                            likely the source of bleeding. Biopsied.                           -Exam was inadequate even after large amount of                            lavage and manual disimpaction                           -No overt bleeding seen throughout the colon Moderate Sedation:      Per Anesthesia Care Recommendation:           - Resume previous diet.                           -  Await pathology results.                           - Repeat colonoscopy at the next available                            appointment because the bowel preparation was poor.                           - If biopsies of the rectal ulcer is non-diagnostic                            would recommend repeat Colonoscopy with extended                            bowel prep as outpatient                           - Return to GI office at appointment to be                            scheduled. Procedure Code(s):        --- Professional ---                           [phone], Colonoscopy, flexible; with biopsy, single                            or multiple Diagnosis Code(s):        --- Professional ---                           K62.89, Other specified diseases of anus and rectum                           K62.6, Ulcer of anus and rectum                           K92.1, Melena (includes Hematochezia) CPT copyright 2022 American Medical Association. All rights reserved. The codes documented in this report are preliminary and upon coder review may  be revised to meet current compliance requirements. Sanjuan Dame, MD Sanjuan Dame, MD 01/13/2023  9:39:41 AM This report has been signed electronically. Number of Addenda: 0

## 2023-01-13 NOTE — Progress Notes (Addendum)
[facility] Progress Note    Assessment/ Plan:   ESRD -outpatient HD orders: [facility].  MWF.  4 hours.  EDW 71.2 kg.  F180. Flow rates: 400/autoflow 1.5.  3K/2.5 calcium.  TDC, AVG maturing.  Meds: Venofer 50 mg once weekly, Mircera 75 mcg every 2 weeks (last dose 7/22), heparin 5000 units bolus every treatment and with Hep-Lock.  Has had numerous missed treatments, last dialysis was on 7/30 (make-up treatment) -HD today on MWF schedule.  No heparin w/ HD--I have discussed this with his outpatient HD unit as well   GI bleed, proctitis Anemia of chronic kidney disease -GI on board. S/p colonoscopy 8/2: Prep of colon was inadequate, palpable rectal ulcer on DRE, cratered large mucosal ulceration in the rectum which is likely source of bleeding-biopsied. Will need repeat colonoscopy outpatient as per GI -Transfuse as needed for hemoglobin less than 7. Serial H&H per primary -due for ESA on 8/5   Volume/ hypertension  -Will UF as tolerated   Secondary Hyperparathyroidism/Hyperphosphatemia -not on VDRA or binders as outpatient. Monitor PO4 for now. Last PTH 86 on 7/10    Cystic pancreatic lesions -per primary service   Hypokalemia -3k bath with HD   Hyponatremia, mild -managing with HD, 137Na bath   Vascular access - currently with [facility] (recently exchanged), s/p LUE AVG on 7/16 with Dr. Genelle Bal.    Subjective:   Patient seen and examined bedside. Just got back from colonoscopy. He reports feeling tired and hungry but no other complaints   Objective:   BP (!) 182/62 (BP Location: Left Arm)   Pulse (!) 52   Temp (!) 97.5 F (36.4 C) (Axillary)   Resp 16   Ht 5\' 10"  (1.778 m)   Wt 68.9 kg   SpO2 99%   BMI 21.79 kg/m   Intake/Output Summary (Last 24 hours) at 01/13/2023 4696 Last data filed at 01/13/2023 2952 Gross per 24 hour  Intake 1058.4 ml  Output --  Net 1058.4 ml   Weight change: 0 kg  Physical Exam: Gen: NAD CVS: RRR Resp: CTA B/L Abd:  soft, NT/ND Ext: no sig edema b/l Les Neuro: awake, alert Dialysis accessL RIJ TDC, LUE AVG +b/t  Imaging: CT ABDOMEN PELVIS WO CONTRAST  Result Date: 01/11/2023 CLINICAL DATA:  Rectal bleeding, anal fissure EXAM: CT ABDOMEN AND PELVIS WITHOUT CONTRAST TECHNIQUE: Multidetector CT imaging of the abdomen and pelvis was performed following the standard protocol without IV contrast. Unenhanced CT was performed per clinician order. Lack of IV contrast limits sensitivity and specificity, especially for evaluation of abdominal/pelvic solid viscera. RADIATION DOSE REDUCTION: This exam was performed according to the departmental dose-optimization program which includes automated exposure control, adjustment of the mA and/or kV according to patient size and/or use of iterative reconstruction technique. COMPARISON:  None Available. FINDINGS: Lower chest: Small bilateral pleural effusions. No acute airspace disease. Hepatobiliary: Indeterminate 1.4 cm hypodensity left lobe liver, likely a cyst or hemangioma. Gallbladder is unremarkable. No biliary duct dilation. Pancreas: There is diffuse fatty atrophy of the pancreas. Multiple small cystic areas are seen throughout the pancreas, greatest in the head and body. Largest in the head measures 9 mm and largest in the tail measures 12 mm. No acute inflammatory change. Spleen: Unremarkable unenhanced appearance. Adrenals/Urinary Tract: The adrenals are unremarkable. No urinary tract calculi or obstructive uropathy within either kidney. The bladder is moderately distended with no filling defect. Stomach/Bowel: No bowel obstruction or ileus. Normal appendix right lower quadrant. There is circumferential wall thickening  of the rectum, with mild perirectal fat stranding. Findings are consistent with proctitis. The anal verge and perineum are not included on this exam due to slice selection, and the reported anal fissure is not well visualized. Vascular/Lymphatic: Aortic  atherosclerosis. No enlarged abdominal or pelvic lymph nodes. Reproductive: Prostate is unremarkable. Other: No free intraperitoneal fluid or free gas. No abdominal wall hernia. Musculoskeletal: No acute or destructive bony abnormalities. IMPRESSION: 1. Circumferential rectal wall thickening with perirectal fat stranding, which could reflect proctitis given clinical presentation. Follow-up is recommended to document resolution and exclude underlying neoplasm. 2. Incidental cystic lesions within the pancreas, superimposed upon background fatty pancreatic atrophy. Largest in the pancreatic tail measures 12 mm. 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 3. Small bilateral pleural effusions. 4. Indeterminate 1.4 cm hypodensity left lobe liver, favor a cyst or hemangioma. This could be reassessed at the time of follow-up imaging for the pancreas. 5.  Aortic Atherosclerosis (ICD10-I70.0). Electronically Signed   By: Sharlet Salina M.D.   On: 01/11/2023 22:48    Labs: BMET Recent Labs  Lab 01/09/23 1205 01/11/23 1220 01/11/23 2104 01/12/23 0732  NA 129* 135 133* 132*  K 3.3* 3.0* 3.4* 3.0*  CL 98 99 97* 98  CO2 22 26 24 25   GLUCOSE 114* 115* 172* 109*  BUN 36* 11 14 20   CREATININE 8.85* 5.08* 5.47* 5.85*  CALCIUM 8.7* 8.7* 9.4 8.3*   CBC Recent Labs  Lab 01/09/23 1205 01/11/23 1220 01/11/23 2104 01/11/23 2349 01/12/23 0732 01/12/23 1448 01/12/23 2113 01/13/23 0504  WBC 10.9* 10.8* 12.1*  --   --   --   --  12.3*  NEUTROABS  --   --  8.8*  --   --   --   --   --   HGB 9.1* 9.5* 9.7*   < > 6.5* 7.2* 9.2* 8.3*  HCT 29.0* 30.1* 31.3*   < > 20.7* 22.6* 28.1* 25.6*  MCV 88.7 88.5 90.7  --   --   --   --  87.1  PLT 239 258 252  --   --   --   --  179   < > = values in this interval not displayed.    Medications:     amitriptyline  10 mg Oral QHS   atorvastatin  20 mg Oral q morning    Chlorhexidine Gluconate Cloth  6 each Topical Q0600   dextrose       gabapentin  100 mg Oral QHS   insulin aspart  0-15 Units Subcutaneous TID WC   insulin aspart  0-5 Units Subcutaneous QHS   levothyroxine  50 mcg Oral Q0600   metoprolol tartrate  50 mg Oral BID   pantoprazole (PROTONIX) IV  40 mg Intravenous Q12H      Anthony Sar, MD [facility] 01/13/2023, 9:42 AM

## 2023-01-13 NOTE — Assessment & Plan Note (Signed)
Continue PPI ?

## 2023-01-13 NOTE — Progress Notes (Signed)
Second tap water enema had liquid [place] stool return.

## 2023-01-13 NOTE — Anesthesia Postprocedure Evaluation (Signed)
Anesthesia Post Note  Patient: [name] [name]  Procedure(s) Performed: COLONOSCOPY WITH PROPOFOL BIOPSY  Patient location during evaluation: Phase II Anesthesia Type: General Level of consciousness: awake Pain management: pain level controlled Vital Signs Assessment: post-procedure vital signs reviewed and stable Respiratory status: spontaneous breathing and respiratory function stable Cardiovascular status: blood pressure returned to baseline and stable Postop Assessment: no headache and no apparent nausea or vomiting Anesthetic complications: no Comments: Late entry   No notable events documented.   Last Vitals:  Vitals:   01/13/23 1400 01/13/23 1430  BP: (!) 145/62 132/60  Pulse:    Resp:  18  Temp:    SpO2:      Last Pain:  Vitals:   01/13/23 1335  TempSrc: Oral  PainSc: 0-No pain                 Windell Norfolk

## 2023-01-13 NOTE — Plan of Care (Signed)

## 2023-01-13 NOTE — Anesthesia Preprocedure Evaluation (Signed)
Anesthesia Evaluation  Patient identified by MRN, date of birth, ID band Patient awake    Reviewed: Allergy & Precautions, H&P , NPO status , Patient's Chart, lab work & pertinent test results, reviewed documented beta blocker date and time   Airway Mallampati: II  TM Distance: >3 FB Neck ROM: full    Dental no notable dental hx.    Pulmonary neg pulmonary ROS   Pulmonary exam normal breath sounds clear to auscultation       Cardiovascular Exercise Tolerance: Good hypertension, +CHF  negative cardio ROS  Rhythm:regular Rate:Normal     Neuro/Psych negative neurological ROS  negative psych ROS   GI/Hepatic negative GI ROS, Neg liver ROS,GERD  ,,  Endo/Other  negative endocrine ROSdiabetesHypothyroidism    Renal/GU Renal diseasenegative Renal ROS  negative genitourinary   Musculoskeletal   Abdominal   Peds  Hematology negative hematology ROS (+) Blood dyscrasia, anemia   Anesthesia Other Findings   Reproductive/Obstetrics negative OB ROS                             Anesthesia Physical Anesthesia Plan  ASA: 4 and emergent  Anesthesia Plan: General   Post-op Pain Management:    Induction:   PONV Risk Score and Plan: Propofol infusion  Airway Management Planned:   Additional Equipment:   Intra-op Plan:   Post-operative Plan:   Informed Consent: I have reviewed the patients History and Physical, chart, labs and discussed the procedure including the risks, benefits and alternatives for the proposed anesthesia with the patient or authorized representative who has indicated his/her understanding and acceptance.     Dental Advisory Given  Plan Discussed with: CRNA  Anesthesia Plan Comments:        Anesthesia Quick Evaluation

## 2023-01-13 NOTE — Care Management Important Message (Signed)
Important Message  Patient Details  Name: [name] [name] MRN: [id number] Date of Birth: 11-17-1960   Medicare Important Message Given:  N/A - LOS <3 / Initial given by admissions     Corey Harold 01/13/2023, 2:58 PM

## 2023-01-13 NOTE — Progress Notes (Signed)
Progress Note   Patient: [name] [name] WJX:[id number] DOB: Jul 15, 1960 DOA: 01/11/2023     1 DOS: the patient was seen and examined on 01/13/2023   Brief hospital admission narrative: As per H&P written by Dr.Zierle-Ghosh on 01/12/23 Matix Henshaw is a 62 y.o. male with medical history significant of anemia, cardiomyopathy, diabetes mellitus type 2, GERD, ESRD, hypertension, hyperlipidemia, pulmonary hypertension, and more presents the ED with a chief complaint of rectal bleeding.  Patient does have ESRD and does dialysis Monday, Wednesday, Friday.  He missed this past Monday but did have a make-up session the next day.  Patient had 2 ER visits on the day of presentation.  The first was to [facility] for generalized weakness and fatigue.  After his discharge he had what was noted to be a large volume of bright red blood per rectum.  EMS had noted a bag of soil bloody towels when they arrived to pick him up.  ED provider reported that there was bloody clots on the sheet that EMS transported him with at time of arrival.  Patient's room smells like a GI bleed.  Patient was not able to answer any of my questions.  Apparently he was oriented for the nurse earlier, but I will, no up out of a dead sleep and he makes eye contact, is clearly protecting his airway, but then goes back to sleep without answering questions.  Unfortunately no further history could be obtained at this time.  No ACP documents.   Assessment and Plan: * GI bleed -With concerns for stercoral colitis and rectal ulcer. -Inadequate bowel preparation during colonoscopy evaluation. -Laxative/bowel regimen as per GI recommendation -Continue antibiotics for now -Advance diet as tolerated -After 2 units PRBCs hemoglobin has stabilized at 8.3 -Patient is hemodynamically stable. -Follow clinical response.  ESRD (end stage renal disease) (HCC) - Continue dialysis as per renal service recommendations.   GERD (gastroesophageal reflux  disease) -Continue PPI.  Hypothyroidism - Continue Synthroid  Proctitis - Continue current antibiotics -Follow GI service recommendations. -Laxative/bowel regimen initiated.  Hypokalemia -Defer electrolyte management to nephro -Potassium 3.1 -Patient will have dialysis today.  Essential hypertension -Stable -Continue current antihypertensive agent -Heart healthy diet discussed with patient.  Hyperlipidemia associated with type 2 diabetes mellitus (HCC) - Continue to follow CBG fluctuation -Sliding scale insulin has been ordered -Continue statin.   Subjective:  Feeling better and reporting no significant rectal bleeding.  Status post endoscopic evaluation (colonoscopy) and pending dialysis treatment.  Physical Exam: Vitals:   01/13/23 1630 01/13/23 1700 01/13/23 1720 01/13/23 1739  BP: (!) 135/58 (!) 143/58 (!) 141/69 (!) 160/62  Pulse:      Resp: 18 16 15 16   Temp:      TempSrc:      SpO2:      Weight:      Height:       General exam: Alert, awake, oriented x 3; reporting no significant bleeding per rectum; no abdominal pain and feeling better today. Respiratory system: Good saturation on room air; no using accessory muscles. Cardiovascular system:RRR. No rubs or gallops. Gastrointestinal system: Abdomen is nondistended, soft and without any guarding; positive bowel sounds. Central nervous system: Alert and oriented. No focal neurological deficits. Extremities: No cyanosis or clubbing. Skin: No petechiae. Psychiatry: Judgement and insight appear normal. Mood & affect appropriate.   Data Reviewed: Renal function panel: Sodium 133, potassium 3.1, chloride 100, bicarb 23, BUN 27, creatinine 6.67 and GFR 9 CBC: White blood cells 12.3, hemoglobin 9.3 and platelet count  179 K Ferritin: 267  Family Communication: No family at bedside.  Disposition: Status is: Inpatient Remains inpatient appropriate because: Following colonoscopy evaluation advance diet, continue  to follow gastroenterology service recommendation and pursued hemodialysis.  Anticipated discharge home on 01/14/2023 if otherwise stable.   Planned Discharge Destination: Home   Time spent: 35 minutes  Author: Vassie Loll, MD 01/13/2023 6:09 PM  For on call review [email or web address].

## 2023-01-13 NOTE — Progress Notes (Signed)
Patient underwent Colonoscopy under propofol sedation.  Tolerated the procedure adequately.   FINDINGS:   - Preparation of the colon was inadequate.  - Palpable rectal ulcer found on digital rectal exam on the posterior wall.   - Stool in the entire examined colon.   - Cratered large Mucosal ulceration in the rectum likely the source of bleeding. Biopsied.   - Exam was inadequate even after large amount of lavage and manual disimpaction   - No overt bleeding seen throughout the colon  RECOMMENDATIONS  - Resume diet.   - Await pathology results.   - Repeat colonoscopy at the next available appointment because the bowel preparation was poor.   - If biopsies of the rectal ulcer are non- diagnostic would recommend repeat Colonoscopy with extended bowel prep as outpatient   -Patient to be followed with GI office as outpatient .appointment to be scheduled.  -Avoid constipation : Miralax BID and Metamucil BID  Vista Lawman, MD Gastroenterology and Hepatology [facility]

## 2023-01-13 NOTE — Assessment & Plan Note (Signed)
-  Stable -Continue current antihypertensive agent -Heart healthy diet discussed with patient.

## 2023-01-13 NOTE — Transfer of Care (Signed)
Immediate Anesthesia Transfer of Care Note  Patient: [name] [name]  Procedure(s) Performed: COLONOSCOPY WITH PROPOFOL BIOPSY  Patient Location: PACU  Anesthesia Type:MAC  Level of Consciousness: awake, sedated, unresponsive, and drowsy  Airway & Oxygen Therapy: Patient Spontanous Breathing and Patient connected to nasal cannula oxygen  Post-op Assessment: Report given to RN and Post -op Vital signs reviewed and stable  Post vital signs: Reviewed and stable  Last Vitals:  Vitals Value Taken Time  BP 154/25 01/13/23 0805  Temp    Pulse 48 01/13/23 0808  Resp 14 01/13/23 0808  SpO2 100 % 01/13/23 0808  Vitals shown include unfiled device data.  Last Pain:  Vitals:   01/13/23 0739  TempSrc:   PainSc: 0-No pain         Complications: No notable events documented.

## 2023-01-13 NOTE — Progress Notes (Signed)
Patient presenting with multiple liquid bloody stools during the night, patient noncompliant with bowel prrp, only 1/4 of prep was drank. First tap water enema, had liquid [place] stool, with a few small chunks of stool. Patient complaining of rectal pain. Patient lying in bed at this time, call bell within reach.

## 2023-01-13 NOTE — H&P (Addendum)
We will proceed with Colonoscopy as scheduled.    I thoroughly discussed with the patient the procedure, including the risks involved. Patient understands what the procedure involves including the benefits and any risks. Patient understands alternatives to the proposed procedure. Risks including (but not limited to) bleeding, tearing of the lining (perforation), rupture of adjacent organs, problems with heart and lung function, infection, and medication reactions. A small percentage of complications may require surgery, hospitalization, repeat endoscopic procedure, and/or transfusion.  Patient understood and agreed.

## 2023-01-13 NOTE — Progress Notes (Signed)
   HEMODIALYSIS TREATMENT NOTE:   4 hour heparin-free treatment completed using RIJ TDC.  Cath exit site is unremarkable.  Poor blood flows despite recent exchange.  "It acts up all the time [at outpatient center]."  Max Qb 300 with frequent line reversal and flushing.  Cartridge/dialyzer changed x 1 with minimal blood loss. Will attempt cathflo pre-HD if still here.  Goal met: 2 liters removed.  All blood was returned.    01/13/23 1830  Vitals  Temp 97.9 F (36.6 C)  Temp Source Oral  BP 134/62  MAP (mmHg) 80  BP Location Right Arm  BP Method Automatic  Patient Position (if appropriate) Sitting  Pulse Rate 68  Pulse Rate Source Monitor  ECG Heart Rate 73  Resp 18  Oxygen Therapy  SpO2 100 %  O2 Device Room Air  Post Treatment  Dialyzer Clearance Lightly streaked  Duration of HD Treatment -hour(s) 4 hour(s)  Hemodialysis Intake (mL) 0 mL  Liters Processed 72  Fluid Removed (mL) 2000 mL  Tolerated HD Treatment Yes  Post-Hemodialysis Comments Goal met.  Poor cath blood flow.  Fistula / Graft Left Upper arm Arteriovenous vein graft  Placement Date/Time: 12/27/22 1122   Placed prior to admission: No  Orientation: Left  Access Location: Upper arm  Access Type: Arteriovenous vein graft  Expiration Date: 06/18/27  Fistula / Graft Assessment Thrill;Bruit  Status Patent  Hemodialysis Catheter Right Internal jugular Double lumen Permanent (Tunneled)  Placement Date/Time: 12/27/22 0948   Placed prior to admission: No  Serial / Lot #: [id number]  Expiration Date: 11/09/26  Time Out: Correct patient;Correct site;Correct procedure  Maximum sterile barrier precautions: Hand hygiene;Cap;Mask;Sterile gow...  Site Condition No complications  Blue Lumen Status Flushed;Heparin locked;Dead end cap in place  Red Lumen Status Flushed;Heparin locked;Dead end cap in place  Purple Lumen Status N/A  Catheter fill solution Heparin 1000 units/ml  Catheter fill volume (Arterial) 1.9 cc  Catheter fill  volume (Venous) 1.9  Dressing Type Transparent;Tube stabilization device  Dressing Status Antimicrobial disc in place;Clean, Dry, Intact  Interventions New dressing  Drainage Description None  Dressing Change Due 01/20/23  Post treatment catheter status Capped and Clamped   Arman Filter, RN AP [facility]

## 2023-01-14 DIAGNOSIS — K625 Hemorrhage of anus and rectum: Secondary | ICD-10-CM | POA: Diagnosis not present

## 2023-01-14 DIAGNOSIS — D62 Acute posthemorrhagic anemia: Secondary | ICD-10-CM

## 2023-01-14 DIAGNOSIS — E876 Hypokalemia: Secondary | ICD-10-CM | POA: Diagnosis not present

## 2023-01-14 DIAGNOSIS — N186 End stage renal disease: Secondary | ICD-10-CM | POA: Diagnosis not present

## 2023-01-14 LAB — CBC
HCT: 27.9 % — ABNORMAL LOW (ref 39.0–52.0)
Hemoglobin: 9.3 g/dL — ABNORMAL LOW (ref 13.0–17.0)
MCH: 28.5 pg (ref 26.0–34.0)
MCHC: 33.3 g/dL (ref 30.0–36.0)
MCV: 85.6 fL (ref 80.0–100.0)
Platelets: 144 10*3/uL — ABNORMAL LOW (ref 150–400)
RBC: 3.26 MIL/uL — ABNORMAL LOW (ref 4.22–5.81)
RDW: 15 % (ref 11.5–15.5)
WBC: 8.3 10*3/uL (ref 4.0–10.5)
nRBC: 0 % (ref 0.0–0.2)

## 2023-01-14 LAB — GLUCOSE, CAPILLARY
Glucose-Capillary: 69 mg/dL — ABNORMAL LOW (ref 70–99)
Glucose-Capillary: 63 mg/dL — ABNORMAL LOW (ref 70–99)
Glucose-Capillary: 99 mg/dL (ref 70–99)
Glucose-Capillary: 118 mg/dL — ABNORMAL HIGH (ref 70–99)

## 2023-01-14 LAB — PREPARE RBC (CROSSMATCH)

## 2023-01-14 MED ORDER — SODIUM CHLORIDE 0.9% IV SOLUTION: Freq: Once | INTRAVENOUS | Status: AC

## 2023-01-14 MED ORDER — POLYETHYLENE GLYCOL 3350 17 G PO PACK: 17.0000 g | PACK | Freq: Two times a day (BID) | ORAL | 0 refills | Status: DC

## 2023-01-14 MED ORDER — PANTOPRAZOLE SODIUM 40 MG PO TBEC
40.0000 mg | DELAYED_RELEASE_TABLET | Freq: Every day | ORAL | 1 refills | Status: DC
Start: 2023-01-14 — End: 2023-05-20

## 2023-01-14 MED ORDER — AMOXICILLIN-POT CLAVULANATE 500-125 MG PO TABS: 1.0000 | ORAL_TABLET | Freq: Two times a day (BID) | ORAL | 0 refills | Status: AC

## 2023-01-14 MED ORDER — FAMOTIDINE 20 MG PO TABS: 20.0000 mg | ORAL_TABLET | Freq: Every day | ORAL | Status: DC

## 2023-01-14 MED ORDER — DOCUSATE SODIUM 100 MG PO CAPS: 100.0000 mg | ORAL_CAPSULE | Freq: Two times a day (BID) | ORAL | 2 refills | Status: DC

## 2023-01-14 NOTE — Progress Notes (Signed)
Hypoglycemic Event  CBG: 69  Treatment: 4 oz juice/soda  Symptoms: None  Follow-up CBG: Time:0744 CBG Result:63 Treatment: 4 oz juice/soda  Symptoms: None  Follow-up CBG: Time: 0805 CBG Result:99  Possible Reasons for Event: Inadequate meal intake and Medication regimen:    Comments/MD notified:Dr. Gwenlyn Perking made aware    Catalina Lunger

## 2023-01-14 NOTE — Progress Notes (Signed)
   01/14/23 0536  Provider Notification  Provider Name/Title Dr. Carren Rang  Date Provider Notified 01/14/23  Time Provider Notified [phone]  Notification Reason Critical Result  Test performed and critical result hgb 6.8  Date Critical Result Received 01/14/23  Time Critical Result Received 0530  Provider response See new orders  Date of Provider Response 01/14/23

## 2023-01-14 NOTE — Progress Notes (Signed)
Patient rested through the night, no complaints of pain, plan of care ongoing.

## 2023-01-14 NOTE — Progress Notes (Signed)
   01/14/23 0505  Vitals  Temp 98.4 F (36.9 C)  Temp Source Oral  BP (!) 176/63  MAP (mmHg) 96  BP Location Right Arm  BP Method Automatic  Patient Position (if appropriate) Lying  Pulse Rate 60  Pulse Rate Source Dinamap  Resp 18  MEWS COLOR  MEWS Score Color Green  Oxygen Therapy  SpO2 100 %  O2 Device Room Air  MEWS Score  MEWS Temp 0  MEWS Systolic 0  MEWS Pulse 0  MEWS RR 0  MEWS LOC 0  MEWS Score 0   Dr. Carren Rang aware of BP no new orders received

## 2023-01-14 NOTE — Discharge Summary (Signed)
Physician Discharge Summary   Patient: [name] [name] MRN: [id number] DOB: 1960-06-29  Admit date:     01/11/2023  Discharge date: 01/14/23  Discharge Physician: Vassie Loll   PCP: Farris Has, MD   Recommendations at discharge:  Repeat CBC to follow hemoglobin trend/stability Reassess blood pressure and adjust antihypertensive regimen as required Continue to follow CBGs with further adjustment to hypoglycemic regimen as needed Make sure patient follow-up with gastroenterology service as instructed Epogen therapy as per nephrology description.  Discharge Diagnoses: Principal Problem:   GI bleed Active Problems:   Hyperlipidemia associated with type 2 diabetes mellitus (HCC)   Essential hypertension   Hypokalemia   Proctitis   Hypothyroidism   GERD (gastroesophageal reflux disease)   ESRD (end stage renal disease) (HCC)   Rectal bleeding   Acute blood loss anemia   Brief hospital admission narrative: As per H&P written by Dr.Zierle-Ghosh on 01/12/23 [name] [name] is a 62 y.o. male with medical history significant of anemia, cardiomyopathy, diabetes mellitus type 2, GERD, ESRD, hypertension, hyperlipidemia, pulmonary hypertension, and more presents the ED with a chief complaint of rectal bleeding.  Patient does have ESRD and does dialysis Monday, Wednesday, Friday.  He missed this past Monday but did have a make-up session the next day.  Patient had 2 ER visits on the day of presentation.  The first was to [facility] for generalized weakness and fatigue.  After his discharge he had what was noted to be a large volume of bright red blood per rectum.  EMS had noted a bag of soil bloody towels when they arrived to pick him up.  ED provider reported that there was bloody clots on the sheet that EMS transported him with at time of arrival.  Patient's room smells like a GI bleed.  Patient was not able to answer any of my questions.  Apparently he was oriented for the nurse earlier, but I  will, no up out of a dead sleep and he makes eye contact, is clearly protecting his airway, but then goes back to sleep without answering questions.  Unfortunately no further history could be obtained at this time.  No ACP documents.   Assessment and Plan: * GI bleed/acute blood loss anemia -With concerns for stercoral colitis and rectal ulcer. -Inadequate bowel preparation during colonoscopy evaluation. -Laxative/bowel regimen as per GI recommendations -Continue antibiotics, using Augmentin for 5 more days at discharge to complete therapy. -Diet advanced and well-tolerated at discharge. -After a total of 3 units PRBCs hemoglobin has stabilized at 9.3 -Patient is hemodynamically stable. -Continue to follow hemoglobin trend as an outpatient and continue the use of Epogen as per nephrology discretion.   ESRD (end stage renal disease) (HCC) - Continue dialysis as per renal service recommendations.    GERD (gastroesophageal reflux disease) -Continue PPI and the use of famotidine nightly.   Hypothyroidism - Continue Synthroid -Continue to follow thyroid panel as an outpatient with further adjustment to Synthroid dose as required.   Proctitis - Complete oral antibiotics and follow the use of bowel regimen as recommended by GI. -Maintain adequate hydration. -Plan is to follow-up as an outpatient with gastroenterology service for repeat colonoscopy.   Hypokalemia -Defer electrolyte management to nephro -Patient denies hemodialysis 01-13-23 -Potassium regulated per nephrology recommendations. Electrolytes.   Essential hypertension -Stable for the most part well-controlled. -Continue current antihypertensive agent -Heart healthy diet discussed with patient.   Hyperlipidemia associated with type 2 diabetes mellitus (HCC) - Continue to follow CBG fluctuation -Resume home hypoglycemic regimen -  Continue the use of a statin -Discussed with patient.   Consultants: Nephrology service,  gastroenterology service. Procedures performed: See below for x-ray reports.  Inpatient hemodialysis and colonoscopy evaluation. Disposition: Home Diet recommendation: Modified carbohydrate/heart healthy diet.  DISCHARGE MEDICATION: Allergies as of 01/14/2023   No Known Allergies      Medication List     STOP taking these medications    HYDROcodone-acetaminophen 5-325 MG tablet Commonly known as: Norco   MIRCERA IJ       TAKE these medications    Accu-Chek Guide w/Device Kit in the morning.   Accu-Chek Softclix Lancets lancets   acetaminophen 500 MG tablet Commonly known as: TYLENOL Take 500 mg by mouth every 6 (six) hours as needed (arthritis).   amitriptyline 10 MG tablet Commonly known as: ELAVIL Take 10 mg by mouth at bedtime.   amLODipine 5 MG tablet Commonly known as: NORVASC Take 5 mg by mouth every morning.   amoxicillin-clavulanate 500-125 MG tablet Commonly known as: Augmentin Take 1 tablet by mouth 2 times daily at 12 noon and 4 pm for 7 days.   atorvastatin 20 MG tablet Commonly known as: LIPITOR Take 20 mg by mouth every morning.   cyanocobalamin 1000 MCG tablet Commonly known as: VITAMIN B12 Take 1,000 mcg by mouth at bedtime.   docusate sodium 100 MG capsule Commonly known as: Colace Take 1 capsule (100 mg total) by mouth 2 (two) times daily.   famotidine 20 MG tablet Commonly known as: Pepcid Take 1 tablet (20 mg total) by mouth at bedtime. What changed:  when to take this reasons to take this   gabapentin 100 MG capsule Commonly known as: NEURONTIN Take 100 mg by mouth at bedtime.   hydrALAZINE 25 MG tablet Commonly known as: APRESOLINE Take 1 tablet (25 mg total) by mouth every 8 (eight) hours. What changed:  how much to take when to take this   levothyroxine 50 MCG tablet Commonly known as: SYNTHROID Take 50 mcg by mouth daily before breakfast.   metoprolol tartrate 50 MG tablet Commonly known as: LOPRESSOR Take 1  tablet (50 mg total) by mouth 2 (two) times daily.   pantoprazole 40 MG tablet Commonly known as: Protonix Take 1 tablet (40 mg total) by mouth daily.   polyethylene glycol 17 g packet Commonly known as: MiraLax Take 17 g by mouth 2 (two) times daily.        Follow-up Information     Farris Has, MD. Schedule an appointment as soon as possible for a visit in 10 day(s).   Specialty: Family Medicine Contact information: [address] [place] [address] [phone]         Franky Macho, MD. Call on 01/17/2023.   Specialty: Gastroenterology Why: to confirmed appointment details. Contact information: [address] [place] [address] [phone]                Discharge Exam: Ceasar Mons Weights   01/13/23 0630 01/13/23 1335 01/13/23 1830  Weight: 68.9 kg 69 kg 67.1 kg    General exam: Alert, awake, oriented x 3; reporting no significant bleeding per rectum; no abdominal pain and feeling better today. Respiratory system: Good saturation on room air; no using accessory muscles. Cardiovascular system:RRR. No rubs or gallops. Gastrointestinal system: Abdomen is nondistended, soft and without any guarding; positive bowel sounds. Central nervous system: Alert and oriented. No focal neurological deficits. Extremities: No cyanosis or clubbing. Skin: No petechiae. Psychiatry: Judgement and insight  appear normal. Mood & affect appropriate.   Condition at discharge: Stable and improved.  The results of significant diagnostics from this hospitalization (including imaging, microbiology, ancillary and laboratory) are listed below for reference.   Imaging Studies: CT ABDOMEN PELVIS WO CONTRAST  Result Date: 01/11/2023 CLINICAL DATA:  Rectal bleeding, anal fissure EXAM: CT ABDOMEN AND PELVIS WITHOUT CONTRAST TECHNIQUE: Multidetector CT imaging of the abdomen and pelvis was performed following the standard protocol without IV contrast.  Unenhanced CT was performed per clinician order. Lack of IV contrast limits sensitivity and specificity, especially for evaluation of abdominal/pelvic solid viscera. RADIATION DOSE REDUCTION: This exam was performed according to the departmental dose-optimization program which includes automated exposure control, adjustment of the mA and/or kV according to patient size and/or use of iterative reconstruction technique. COMPARISON:  None Available. FINDINGS: Lower chest: Small bilateral pleural effusions. No acute airspace disease. Hepatobiliary: Indeterminate 1.4 cm hypodensity left lobe liver, likely a cyst or hemangioma. Gallbladder is unremarkable. No biliary duct dilation. Pancreas: There is diffuse fatty atrophy of the pancreas. Multiple small cystic areas are seen throughout the pancreas, greatest in the head and body. Largest in the head measures 9 mm and largest in the tail measures 12 mm. No acute inflammatory change. Spleen: Unremarkable unenhanced appearance. Adrenals/Urinary Tract: The adrenals are unremarkable. No urinary tract calculi or obstructive uropathy within either kidney. The bladder is moderately distended with no filling defect. Stomach/Bowel: No bowel obstruction or ileus. Normal appendix right lower quadrant. There is circumferential wall thickening of the rectum, with mild perirectal fat stranding. Findings are consistent with proctitis. The anal verge and perineum are not included on this exam due to slice selection, and the reported anal fissure is not well visualized. Vascular/Lymphatic: Aortic atherosclerosis. No enlarged abdominal or pelvic lymph nodes. Reproductive: Prostate is unremarkable. Other: No free intraperitoneal fluid or free gas. No abdominal wall hernia. Musculoskeletal: No acute or destructive bony abnormalities. IMPRESSION: 1. Circumferential rectal wall thickening with perirectal fat stranding, which could reflect proctitis given clinical presentation. Follow-up is  recommended to document resolution and exclude underlying neoplasm. 2. Incidental cystic lesions within the pancreas, superimposed upon background fatty pancreatic atrophy. Largest in the pancreatic tail measures 12 mm. 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 3. Small bilateral pleural effusions. 4. Indeterminate 1.4 cm hypodensity left lobe liver, favor a cyst or hemangioma. This could be reassessed at the time of follow-up imaging for the pancreas. 5.  Aortic Atherosclerosis (ICD10-I70.0). Electronically Signed   By: Sharlet Salina M.D.   On: 01/11/2023 22:48   DG Abdomen Acute W/Chest  Result Date: 01/09/2023 CLINICAL DATA:  consiptation, weakness EXAM: DG ABDOMEN ACUTE WITH 1 VIEW CHEST COMPARISON:  12/27/2022. FINDINGS: There is no evidence of dilated bowel loops or free intraperitoneal air. No radiopaque calculi or other significant radiographic abnormality is seen. Heart size and mediastinal contours are within normal limits. Both lungs are clear. Note is made of elevated right hemidiaphragm. Right IJ hemodialysis catheter again seen with its tip overlying the cavoatrial junction region. IMPRESSION: Negative abdominal radiographs.  No acute cardiopulmonary disease. Electronically Signed   By: Jules Schick M.D.   On: 01/09/2023 12:52   DG CHEST PORT 1 VIEW  Result Date: 12/27/2022 CLINICAL DATA:  Status post dialysis catheter placement. EXAM: PORTABLE CHEST 1 VIEW COMPARISON:  September 07, 2020. FINDINGS: Stable cardiomediastinal silhouette. Interval placement of right internal jugular dialysis catheter with distal tip in expected position of cavoatrial  junction. No pneumothorax is noted. Lungs are clear. Bony thorax is unremarkable. IMPRESSION: Interval placement of right internal jugular dialysis catheter as described above. Electronically Signed   By: Lupita Raider M.D.   On: 12/27/2022 16:12   DG  C-Arm 1-60 Min-No Report  Result Date: 12/27/2022 Fluoroscopy was utilized by the requesting physician.  No radiographic interpretation.    Microbiology: Results for orders placed or performed during the hospital encounter of 01/09/23  SARS Coronavirus 2 by RT PCR (hospital order, performed in [facility] hospital lab) *cepheid single result test* Anterior Nasal Swab     Status: None   Collection Time: 01/09/23 12:52 PM   Specimen: Anterior Nasal Swab  Result Value Ref Range Status   SARS Coronavirus 2 by RT PCR NEGATIVE NEGATIVE Final    Comment: (NOTE) SARS-CoV-2 target nucleic acids are NOT DETECTED.  The SARS-CoV-2 RNA is generally detectable in upper and lower respiratory specimens during the acute phase of infection. The lowest concentration of SARS-CoV-2 viral copies this assay can detect is 250 copies / mL. A negative result does not preclude SARS-CoV-2 infection and should not be used as the sole basis for treatment or other patient management decisions.  A negative result may occur with improper specimen collection / handling, submission of specimen other than nasopharyngeal swab, presence of viral mutation(s) within the areas targeted by this assay, and inadequate number of viral copies (<250 copies / mL). A negative result must be combined with clinical observations, patient history, and epidemiological information.  Fact Sheet for Patients:   RoadLapTop.co.za  Fact Sheet for Healthcare Providers: http://kim-miller.com/  This test is not yet approved or  cleared by the [place] FDA and has been authorized for detection and/or diagnosis of SARS-CoV-2 by FDA under an Emergency Use Authorization (EUA).  This EUA will remain in effect (meaning this test can be used) for the duration of the COVID-19 declaration under Section 564(b)(1) of the Act, 21 U.S.C. section 360bbb-3(b)(1), unless the authorization is terminated  or revoked sooner.  Performed at [facility], [address] [place], [place] [address]     Labs: CBC: Recent Labs  Lab 01/11/23 1220 01/11/23 2104 01/11/23 2349 01/12/23 1448 01/12/23 2113 01/13/23 0504 01/14/23 0416 01/14/23 1222  WBC 10.8* 12.1*  --   --   --  12.3* 6.8 8.3  NEUTROABS  --  8.8*  --   --   --   --   --   --   HGB 9.5* 9.7*   < > 7.2* 9.2* 8.3* 6.8* 9.3*  HCT 30.1* 31.3*   < > 22.6* 28.1* 25.6* 20.9* 27.9*  MCV 88.5 90.7  --   --   --  87.1 87.4 85.6  PLT 258 252  --   --   --  179 144* 144*   < > = values in this interval not displayed.   Basic Metabolic Panel: Recent Labs  Lab 01/09/23 1205 01/11/23 1220 01/11/23 2104 01/12/23 0732 01/13/23 1318  NA 129* 135 133* 132* 133*  K 3.3* 3.0* 3.4* 3.0* 3.1*  CL 98 99 97* 98 100  CO2 22 26 24 25 23   GLUCOSE 114* 115* 172* 109* 89  BUN 36* 11 14 20  27*  CREATININE 8.85* 5.08* 5.47* 5.85* 6.67*  CALCIUM 8.7* 8.7* 9.4 8.3* 7.7*  MG  --   --   --  1.9  --   PHOS  --   --   --   --  3.7  Liver Function Tests: Recent Labs  Lab 01/09/23 1205 01/11/23 1220 01/11/23 2104 01/12/23 0732 01/13/23 1318  AST 13* 31 31 17   --   ALT 9 12 14 11   --   ALKPHOS 108 115 118 90  --   BILITOT 1.0 1.0 0.8 0.6  --   PROT 5.9* 5.7* 6.2* 4.7*  --   ALBUMIN 2.5* 2.5* 2.7* 2.2* 2.1*   CBG: Recent Labs  Lab 01/13/23 2055 01/14/23 0716 01/14/23 0744 01/14/23 0811 01/14/23 1119  GLUCAP 130* 69* 63* 99 118*    Discharge time spent: greater than 30 minutes.  Signed: Vassie Loll, MD [facility] 01/14/2023

## 2023-01-15 LAB — TYPE AND SCREEN
ABO/RH(D): AB POS
Antibody Screen: NEGATIVE
Unit division: 0
Unit division: 0
Unit division: 0

## 2023-01-15 LAB — BPAM RBC
Blood Product Expiration Date: 202408202359
Blood Product Expiration Date: 202408292359
Blood Product Expiration Date: 202408292359
ISSUE DATE / TIME: 202408010958
ISSUE DATE / TIME: 202408011642
ISSUE DATE / TIME: 202408030620
Unit Type and Rh: 6200
Unit Type and Rh: 6200
Unit Type and Rh: 6200

## 2023-01-16 ENCOUNTER — Encounter (HOSPITAL_COMMUNITY): Payer: Self-pay | Admitting: Gastroenterology

## 2023-01-16 DIAGNOSIS — Z992 Dependence on renal dialysis: Secondary | ICD-10-CM | POA: Diagnosis not present

## 2023-01-16 DIAGNOSIS — N2581 Secondary hyperparathyroidism of renal origin: Secondary | ICD-10-CM | POA: Diagnosis not present

## 2023-01-16 DIAGNOSIS — N186 End stage renal disease: Secondary | ICD-10-CM | POA: Diagnosis not present

## 2023-01-16 NOTE — Progress Notes (Signed)
I reviewed the pathology results. Ann, can you send her a letter with the findings as described below please?  Thanks,  Vista Lawman, MD Gastroenterology and Hepatology [facility]  ---------------------------------------------------------------------------------------------  [facility] [address], [address], [place], [place] [address] Phone:  [phone]   01/16/23 [name], [place]   Dear [name] [name],  I am writing to inform you that the biopsies taken during your recent endoscopic examination showed:  Large rectal ulcer and biopsies were not diagnostic . I strongly recommend repeat colonoscopy with extended bowel prep to make sure there is no cancer . We will also make any appointment for you with our clinic   Please call us at [phone] if you have persistent problems or have questions about your condition that have not been fully answered at this time.  Sincerely,  Vista Lawman, MD Gastroenterology and Hepatology

## 2023-01-17 ENCOUNTER — Encounter (INDEPENDENT_AMBULATORY_CARE_PROVIDER_SITE_OTHER): Payer: Self-pay | Admitting: *Deleted

## 2023-01-21 DIAGNOSIS — N186 End stage renal disease: Secondary | ICD-10-CM | POA: Diagnosis not present

## 2023-01-21 DIAGNOSIS — R6889 Other general symptoms and signs: Secondary | ICD-10-CM | POA: Diagnosis not present

## 2023-01-21 DIAGNOSIS — N2581 Secondary hyperparathyroidism of renal origin: Secondary | ICD-10-CM | POA: Diagnosis not present

## 2023-01-21 DIAGNOSIS — Z992 Dependence on renal dialysis: Secondary | ICD-10-CM | POA: Diagnosis not present

## 2023-01-23 DIAGNOSIS — R6889 Other general symptoms and signs: Secondary | ICD-10-CM | POA: Diagnosis not present

## 2023-01-23 DIAGNOSIS — N2581 Secondary hyperparathyroidism of renal origin: Secondary | ICD-10-CM | POA: Diagnosis not present

## 2023-01-23 DIAGNOSIS — N186 End stage renal disease: Secondary | ICD-10-CM | POA: Diagnosis not present

## 2023-01-23 DIAGNOSIS — Z992 Dependence on renal dialysis: Secondary | ICD-10-CM | POA: Diagnosis not present

## 2023-01-25 DIAGNOSIS — N186 End stage renal disease: Secondary | ICD-10-CM | POA: Diagnosis not present

## 2023-01-25 DIAGNOSIS — Z992 Dependence on renal dialysis: Secondary | ICD-10-CM | POA: Diagnosis not present

## 2023-01-25 DIAGNOSIS — R6889 Other general symptoms and signs: Secondary | ICD-10-CM | POA: Diagnosis not present

## 2023-01-25 DIAGNOSIS — N2581 Secondary hyperparathyroidism of renal origin: Secondary | ICD-10-CM | POA: Diagnosis not present

## 2023-01-27 DIAGNOSIS — Z992 Dependence on renal dialysis: Secondary | ICD-10-CM | POA: Diagnosis not present

## 2023-01-27 DIAGNOSIS — R6889 Other general symptoms and signs: Secondary | ICD-10-CM | POA: Diagnosis not present

## 2023-01-27 DIAGNOSIS — N2581 Secondary hyperparathyroidism of renal origin: Secondary | ICD-10-CM | POA: Diagnosis not present

## 2023-01-27 DIAGNOSIS — N186 End stage renal disease: Secondary | ICD-10-CM | POA: Diagnosis not present

## 2023-01-30 DIAGNOSIS — R6889 Other general symptoms and signs: Secondary | ICD-10-CM | POA: Diagnosis not present

## 2023-01-30 DIAGNOSIS — N2581 Secondary hyperparathyroidism of renal origin: Secondary | ICD-10-CM | POA: Diagnosis not present

## 2023-01-30 DIAGNOSIS — N186 End stage renal disease: Secondary | ICD-10-CM | POA: Diagnosis not present

## 2023-01-30 DIAGNOSIS — Z992 Dependence on renal dialysis: Secondary | ICD-10-CM | POA: Diagnosis not present

## 2023-02-01 DIAGNOSIS — N186 End stage renal disease: Secondary | ICD-10-CM | POA: Diagnosis not present

## 2023-02-01 DIAGNOSIS — N2581 Secondary hyperparathyroidism of renal origin: Secondary | ICD-10-CM | POA: Diagnosis not present

## 2023-02-01 DIAGNOSIS — R6889 Other general symptoms and signs: Secondary | ICD-10-CM | POA: Diagnosis not present

## 2023-02-01 DIAGNOSIS — Z992 Dependence on renal dialysis: Secondary | ICD-10-CM | POA: Diagnosis not present

## 2023-02-02 ENCOUNTER — Telehealth (INDEPENDENT_AMBULATORY_CARE_PROVIDER_SITE_OTHER): Payer: Self-pay

## 2023-02-02 NOTE — Telephone Encounter (Signed)
Tried calling patient mother back today no answer and can not leave a message as her vm is full.

## 2023-02-02 NOTE — Telephone Encounter (Signed)
Per Patient mother patient has had an issue with loose stools/diarrhea on going since the Tcs, which was done on 01/13/2023 preformed by Dr. Tasia Catchings. She say the patient has at least two loose stools per day. He is no longer taking Miralax or the stool softeners, nor the Amoxicillin that was prescribed. Denies any fever, abdominal pain, nor blood in stools. Patient is an End stage Renal patient and has HD on M,W,F. Patient mother wants to know if he can take an antidiarrheal, which she says she had called the pharmacy and they told her yes he can take it he has taken two today . She wanted me to let you know he is taking pantoprazole 40 mg once per day. Please advise.

## 2023-02-02 NOTE — Telephone Encounter (Signed)
If the patient is having more than three Bowel movements per day than we need to exclude C.Dif as patient was on antibiotics . Once C.Diff is excluded than only he should take anti-diarrheal

## 2023-02-03 DIAGNOSIS — N2581 Secondary hyperparathyroidism of renal origin: Secondary | ICD-10-CM | POA: Diagnosis not present

## 2023-02-03 DIAGNOSIS — N186 End stage renal disease: Secondary | ICD-10-CM | POA: Diagnosis not present

## 2023-02-03 DIAGNOSIS — R6889 Other general symptoms and signs: Secondary | ICD-10-CM | POA: Diagnosis not present

## 2023-02-03 DIAGNOSIS — Z992 Dependence on renal dialysis: Secondary | ICD-10-CM | POA: Diagnosis not present

## 2023-02-03 NOTE — Telephone Encounter (Signed)
I called and left a message asked that the patient mother please return call to the office.

## 2023-02-06 DIAGNOSIS — N2581 Secondary hyperparathyroidism of renal origin: Secondary | ICD-10-CM | POA: Diagnosis not present

## 2023-02-06 DIAGNOSIS — Z992 Dependence on renal dialysis: Secondary | ICD-10-CM | POA: Diagnosis not present

## 2023-02-06 DIAGNOSIS — N186 End stage renal disease: Secondary | ICD-10-CM | POA: Diagnosis not present

## 2023-02-06 DIAGNOSIS — R6889 Other general symptoms and signs: Secondary | ICD-10-CM | POA: Diagnosis not present

## 2023-02-06 NOTE — Telephone Encounter (Signed)
I spoke with the patient mother and made her aware that if the patient was having more that 3 loose stools per day we needed to check a cdiff. Per patient mother patient is no longer having loose stools and wants to hold off for now doing the testing.

## 2023-02-08 DIAGNOSIS — N2581 Secondary hyperparathyroidism of renal origin: Secondary | ICD-10-CM | POA: Diagnosis not present

## 2023-02-08 DIAGNOSIS — N186 End stage renal disease: Secondary | ICD-10-CM | POA: Diagnosis not present

## 2023-02-08 DIAGNOSIS — Z992 Dependence on renal dialysis: Secondary | ICD-10-CM | POA: Diagnosis not present

## 2023-02-08 DIAGNOSIS — R6889 Other general symptoms and signs: Secondary | ICD-10-CM | POA: Diagnosis not present

## 2023-02-10 DIAGNOSIS — N186 End stage renal disease: Secondary | ICD-10-CM | POA: Diagnosis not present

## 2023-02-10 DIAGNOSIS — N2581 Secondary hyperparathyroidism of renal origin: Secondary | ICD-10-CM | POA: Diagnosis not present

## 2023-02-10 DIAGNOSIS — Z992 Dependence on renal dialysis: Secondary | ICD-10-CM | POA: Diagnosis not present

## 2023-02-10 DIAGNOSIS — R6889 Other general symptoms and signs: Secondary | ICD-10-CM | POA: Diagnosis not present

## 2023-02-11 DIAGNOSIS — Z992 Dependence on renal dialysis: Secondary | ICD-10-CM | POA: Diagnosis not present

## 2023-02-11 DIAGNOSIS — E1122 Type 2 diabetes mellitus with diabetic chronic kidney disease: Secondary | ICD-10-CM | POA: Diagnosis not present

## 2023-02-11 DIAGNOSIS — N186 End stage renal disease: Secondary | ICD-10-CM | POA: Diagnosis not present

## 2023-02-13 DIAGNOSIS — N186 End stage renal disease: Secondary | ICD-10-CM | POA: Diagnosis not present

## 2023-02-13 DIAGNOSIS — Z992 Dependence on renal dialysis: Secondary | ICD-10-CM | POA: Diagnosis not present

## 2023-02-13 DIAGNOSIS — N2581 Secondary hyperparathyroidism of renal origin: Secondary | ICD-10-CM | POA: Diagnosis not present

## 2023-02-13 DIAGNOSIS — R6889 Other general symptoms and signs: Secondary | ICD-10-CM | POA: Diagnosis not present

## 2023-02-16 DIAGNOSIS — Z992 Dependence on renal dialysis: Secondary | ICD-10-CM | POA: Diagnosis not present

## 2023-02-16 DIAGNOSIS — N186 End stage renal disease: Secondary | ICD-10-CM | POA: Diagnosis not present

## 2023-02-16 DIAGNOSIS — Z452 Encounter for adjustment and management of vascular access device: Secondary | ICD-10-CM | POA: Diagnosis not present

## 2023-02-17 DIAGNOSIS — Z992 Dependence on renal dialysis: Secondary | ICD-10-CM | POA: Diagnosis not present

## 2023-02-17 DIAGNOSIS — R6889 Other general symptoms and signs: Secondary | ICD-10-CM | POA: Diagnosis not present

## 2023-02-17 DIAGNOSIS — N186 End stage renal disease: Secondary | ICD-10-CM | POA: Diagnosis not present

## 2023-02-17 DIAGNOSIS — N2581 Secondary hyperparathyroidism of renal origin: Secondary | ICD-10-CM | POA: Diagnosis not present

## 2023-02-20 ENCOUNTER — Other Ambulatory Visit: Payer: Self-pay

## 2023-02-20 ENCOUNTER — Emergency Department (HOSPITAL_COMMUNITY)
Admission: EM | Admit: 2023-02-20 | Discharge: 2023-02-20 | Disposition: A | Discharge status: Home / Self Care | Payer: Medicare HMO | Attending: Emergency Medicine | Admitting: Emergency Medicine

## 2023-02-20 ENCOUNTER — Encounter (HOSPITAL_COMMUNITY): Payer: Self-pay | Admitting: Emergency Medicine

## 2023-02-20 DIAGNOSIS — Z79899 Other long term (current) drug therapy: Secondary | ICD-10-CM | POA: Diagnosis not present

## 2023-02-20 DIAGNOSIS — E1122 Type 2 diabetes mellitus with diabetic chronic kidney disease: Secondary | ICD-10-CM | POA: Insufficient documentation

## 2023-02-20 DIAGNOSIS — Z1152 Encounter for screening for COVID-19: Secondary | ICD-10-CM | POA: Diagnosis not present

## 2023-02-20 DIAGNOSIS — R531 Weakness: Secondary | ICD-10-CM | POA: Diagnosis not present

## 2023-02-20 DIAGNOSIS — N50812 Left testicular pain: Secondary | ICD-10-CM | POA: Insufficient documentation

## 2023-02-20 DIAGNOSIS — N2581 Secondary hyperparathyroidism of renal origin: Secondary | ICD-10-CM | POA: Diagnosis not present

## 2023-02-20 DIAGNOSIS — N50811 Right testicular pain: Secondary | ICD-10-CM | POA: Diagnosis not present

## 2023-02-20 DIAGNOSIS — N186 End stage renal disease: Secondary | ICD-10-CM | POA: Insufficient documentation

## 2023-02-20 DIAGNOSIS — Z992 Dependence on renal dialysis: Secondary | ICD-10-CM | POA: Insufficient documentation

## 2023-02-20 LAB — BASIC METABOLIC PANEL
Anion gap: 10 (ref 5–15)
BUN: 7 mg/dL — ABNORMAL LOW (ref 8–23)
CO2: 27 mmol/L (ref 22–32)
Calcium: 7.4 mg/dL — ABNORMAL LOW (ref 8.9–10.3)
Chloride: 96 mmol/L — ABNORMAL LOW (ref 98–111)
Creatinine, Ser: 2.41 mg/dL — ABNORMAL HIGH (ref 0.61–1.24)
GFR, Estimated: 29 mL/min — ABNORMAL LOW (ref >60)
Glucose, Bld: 149 mg/dL — ABNORMAL HIGH (ref 70–99)
Potassium: 3.5 mmol/L (ref 3.5–5.1)
Sodium: 133 mmol/L — ABNORMAL LOW (ref 135–145)

## 2023-02-20 LAB — CBC
HCT: 32.7 % — ABNORMAL LOW (ref 39.0–52.0)
Hemoglobin: 10.6 g/dL — ABNORMAL LOW (ref 13.0–17.0)
MCH: 28.9 pg (ref 26.0–34.0)
MCHC: 32.4 g/dL (ref 30.0–36.0)
MCV: 89.1 fL (ref 80.0–100.0)
Platelets: 96 10*3/uL — ABNORMAL LOW (ref 150–400)
RBC: 3.67 MIL/uL — ABNORMAL LOW (ref 4.22–5.81)
RDW: 15 % (ref 11.5–15.5)
WBC: 6 10*3/uL (ref 4.0–10.5)
nRBC: 0 % (ref 0.0–0.2)

## 2023-02-20 LAB — SARS CORONAVIRUS 2 BY RT PCR: SARS Coronavirus 2 by RT PCR: NEGATIVE

## 2023-02-20 LAB — TYPE AND SCREEN
ABO/RH(D): AB POS
Antibody Screen: NEGATIVE

## 2023-02-20 LAB — CBG MONITORING, ED: Glucose-Capillary: 164 mg/dL — ABNORMAL HIGH (ref 70–99)

## 2023-02-20 NOTE — ED Provider Notes (Signed)
[place] EMERGENCY DEPARTMENT AT [facility] Provider Note   CSN: [id number] Arrival date & time: 02/20/23  1812     History {Add pertinent medical, surgical, social history, OB history to HPI:1} Chief Complaint  Patient presents with   Weakness    [name] [name] is a 62 y.o. male.  This is a 62 year old male who is here today for generalized weakness. Patient has a past medical history significant for anemia, cardiomyopathy, diabetes, ESRD on Monday Wednesday Friday dialysis, recent proctitis, here today for continued weakness.  Patient was recently admitted for lower GI bleed.  Denies any rectal bleeding at this time.  I reviewed the patient's gastroenterology notes from his recent hospitalization.  Appears that he has a rectal ulceration which is believed to be the culprit.  Bowel prep was inadequate for colonoscopy on the inpatient side.  He is supposed to get an outpatient colonoscopy.   Weakness      Home Medications Prior to Admission medications   Medication Sig Start Date End Date Taking? Authorizing Provider  Accu-Chek Softclix Lancets lancets  08/06/20   [provider]  acetaminophen (TYLENOL) 500 MG tablet Take 500 mg by mouth every 6 (six) hours as needed (arthritis).     [provider]  amitriptyline (ELAVIL) 10 MG tablet Take 10 mg by mouth at bedtime. 05/24/22   [provider]  amLODipine (NORVASC) 5 MG tablet Take 5 mg by mouth every morning.    [provider]  atorvastatin (LIPITOR) 20 MG tablet Take 20 mg by mouth every morning. 09/23/20   [provider]  Blood Glucose Monitoring Suppl (ACCU-CHEK GUIDE) w/Device KIT in the morning. 08/06/20   [provider]  docusate sodium (COLACE) 100 MG capsule Take 1 capsule (100 mg total) by mouth 2 (two) times daily. 01/14/23 01/14/24  Vassie Loll, MD  famotidine (PEPCID) 20 MG tablet Take 1 tablet (20 mg total) by mouth at bedtime. 01/14/23   Vassie Loll,  MD  gabapentin (NEURONTIN) 100 MG capsule Take 100 mg by mouth at bedtime.    [provider]  hydrALAZINE (APRESOLINE) 25 MG tablet Take 1 tablet (25 mg total) by mouth every 8 (eight) hours. Patient taking differently: Take 50 mg by mouth 2 (two) times daily. 09/10/20   Lewie Chamber, MD  levothyroxine (SYNTHROID) 50 MCG tablet Take 50 mcg by mouth daily before breakfast. 02/18/21   [provider]  metoprolol tartrate (LOPRESSOR) 50 MG tablet Take 1 tablet (50 mg total) by mouth 2 (two) times daily. 06/16/20   Jacquelin Hawking, PA-C  pantoprazole (PROTONIX) 40 MG tablet Take 1 tablet (40 mg total) by mouth daily. 01/14/23 01/14/24  Vassie Loll, MD  polyethylene glycol (MIRALAX) 17 g packet Take 17 g by mouth 2 (two) times daily. 01/14/23   Vassie Loll, MD  vitamin B-12 (CYANOCOBALAMIN) 1000 MCG tablet Take 1,000 mcg by mouth at bedtime.    [provider]      Allergies    Patient has no known allergies.    Review of Systems   Review of Systems  Neurological:  Positive for weakness.    Physical Exam Updated Vital Signs BP (!) 154/72   Pulse 61   Temp 97.7 F (36.5 C) (Oral)   Resp 18   Ht 5\' 10"  (1.778 m)   Wt 78 kg   SpO2 95%   BMI 24.68 kg/m  Physical Exam Vitals reviewed.  HENT:     Head: Normocephalic.     Nose:  Nose normal.  Eyes:     Pupils: Pupils are equal, round, and reactive to light.  Cardiovascular:     Rate and Rhythm: Normal rate.  Abdominal:     General: Abdomen is flat. There is no distension.     Palpations: Abdomen is soft. There is no mass.  Skin:    General: Skin is warm and dry.     Coloration: Skin is pale.  Neurological:     General: No focal deficit present.     Mental Status: He is alert.     ED Results / Procedures / Treatments   Labs (all labs ordered are listed, but only abnormal results are displayed) Labs Reviewed  CBG MONITORING, ED - Abnormal; Notable for the following components:      Result Value    Glucose-Capillary 164 (*)    All other components within normal limits  SARS CORONAVIRUS 2 BY RT PCR  BASIC METABOLIC PANEL  CBC  URINALYSIS, ROUTINE W REFLEX MICROSCOPIC  TYPE AND SCREEN    EKG None  Radiology No results found.  Procedures Procedures  {Document cardiac monitor, telemetry assessment procedure when appropriate:1}  Medications Ordered in ED Medications - No data to display  ED Course/ Medical Decision Making/ A&P   {   Click here for ABCD2, HEART and other calculatorsREFRESH Note before signing :1}                              Medical Decision Making This 62 year old male here today for weakness.  Differential diagnoses is anemia, lower GI bleed, less likely infectious.  Plan-patient is somewhat pale.  He has normal vital signs.  Will check a CBC on the patient.  Type and screen ordered.  Digital rectal exam deferred given that there is a known source of bleeding on the patient, and at this time he does not have gross bright red blood per rectum.  Would not meaningfully change course.  No indication for CT imaging at this time as a source of lower GI bleeding has already been identified.  Patient has a soft abdomen.  Reassessment-patient's hemoglobin 10.6, higher than when he was discharged.  Labs are consistent with someone who has he is already.  I discussed admission for TOC placement, however the patient's mother did not wish to proceed with that.  Will discharge patient, have him follow-up with his PCP.  Amount and/or Complexity of Data Reviewed Labs: ordered.     {Document critical care time when appropriate:1} {Document review of labs and clinical decision tools ie heart score, Chads2Vasc2 etc:1}  {Document your independent review of radiology images, and any outside records:1} {Document your discussion with family members, caretakers, and with consultants:1} {Document social determinants of health affecting pt's care:1} {Document your decision  making why or why not admission, treatments were needed:1} Final Clinical Impression(s) / ED Diagnoses Final diagnoses:  None    Rx / DC Orders ED Discharge Orders     None

## 2023-02-20 NOTE — ED Triage Notes (Signed)
Pt presents after dialysis treatment today, per wife has been weak since this morning, with a cough and chills, per dialysis sent for evaluation of low Hgb.

## 2023-02-20 NOTE — Discharge Instructions (Signed)
Your hemoglobin today was 10.5, which is higher than when you were discharged from the hospital.  Please follow-up with your primary care doctor this week to discuss these symptoms.

## 2023-02-20 NOTE — ED Notes (Signed)
Dialysis pt does not make that much urine. Could not give sample at this time.

## 2023-02-20 NOTE — ED Notes (Signed)
Pt is a dialysis patient and makes very little urine. Unable to void at this time.

## 2023-02-22 ENCOUNTER — Ambulatory Visit (INDEPENDENT_AMBULATORY_CARE_PROVIDER_SITE_OTHER): Payer: Medicare HMO | Admitting: Gastroenterology

## 2023-02-22 DIAGNOSIS — N2581 Secondary hyperparathyroidism of renal origin: Secondary | ICD-10-CM | POA: Diagnosis not present

## 2023-02-22 DIAGNOSIS — Z992 Dependence on renal dialysis: Secondary | ICD-10-CM | POA: Diagnosis not present

## 2023-02-22 DIAGNOSIS — N186 End stage renal disease: Secondary | ICD-10-CM | POA: Diagnosis not present

## 2023-02-24 DIAGNOSIS — N2581 Secondary hyperparathyroidism of renal origin: Secondary | ICD-10-CM | POA: Diagnosis not present

## 2023-02-24 DIAGNOSIS — N186 End stage renal disease: Secondary | ICD-10-CM | POA: Diagnosis not present

## 2023-02-24 DIAGNOSIS — Z992 Dependence on renal dialysis: Secondary | ICD-10-CM | POA: Diagnosis not present

## 2023-02-27 DIAGNOSIS — Z992 Dependence on renal dialysis: Secondary | ICD-10-CM | POA: Diagnosis not present

## 2023-02-27 DIAGNOSIS — N2581 Secondary hyperparathyroidism of renal origin: Secondary | ICD-10-CM | POA: Diagnosis not present

## 2023-02-27 DIAGNOSIS — N186 End stage renal disease: Secondary | ICD-10-CM | POA: Diagnosis not present

## 2023-02-28 ENCOUNTER — Ambulatory Visit (INDEPENDENT_AMBULATORY_CARE_PROVIDER_SITE_OTHER): Payer: Medicare HMO | Admitting: Gastroenterology

## 2023-02-28 VITALS — BP 144/71 | HR 58 | Temp 97.7°F | Ht 70.0 in | Wt 172.0 lb

## 2023-02-28 DIAGNOSIS — K769 Liver disease, unspecified: Secondary | ICD-10-CM | POA: Diagnosis not present

## 2023-02-28 DIAGNOSIS — K5904 Chronic idiopathic constipation: Secondary | ICD-10-CM

## 2023-02-28 DIAGNOSIS — D490 Neoplasm of unspecified behavior of digestive system: Secondary | ICD-10-CM | POA: Diagnosis not present

## 2023-02-28 MED ORDER — PEG 3350-KCL-NA BICARB-NACL 420 G PO SOLR: 4000.0000 mL | Freq: Once | ORAL | 0 refills | Status: AC

## 2023-02-28 MED ORDER — POLYETHYLENE GLYCOL 3350 17 G PO PACK
17.0000 g | PACK | Freq: Two times a day (BID) | ORAL | 0 refills | Status: DC
Start: 2023-02-28 — End: 2023-03-08

## 2023-02-28 NOTE — Patient Instructions (Signed)
It was very nice to meet you today, as dicussed with will plan for the following :  1)  Follow a high fiber diet: Include foods such as dates, prunes, pears, and kiwi. Use Metamucil twice a day.  2) CT Abdomen  3) Colonoscopy

## 2023-02-28 NOTE — Progress Notes (Signed)
[name] [name] , M.D. Gastroenterology & Hepatology [facility] Gastroenterology [address] [place], [place] [address] Primary Care Physician: Farris Has, MD [address] [employer] [place] [address]  Chief Complaint:  Rectal ulcer, liver and pancreatic lesion  History of Present Illness:  [name] [name] is a 62 y.o. male with cardiomyopathy, DM, GERD, HTN, pulmonary HTN, ESRD on HD ( MWF), was seen recently in the hospital with blood per rectum s/p Colonoscopy found to have large rectal ulcer .  Patient is here in the GI clinic for evaluation of rectal ulcer, liver and pancreatic lesion  Patient is accompanied by mother in clinic today.  Mother is answering most of the questions for the patient as he is slow in mentation.  Reports no further blood per rectum and having 2-3 bowel movements daily which is good.  No difficulty swallowing food  Patient was seen inpatient, where he was constipated and was having blood per rectum.  At that time baseline hemoglobin 9.5 dropped to 6.5 and required transfusion  CT abdomen demonstrated circumferential rectal wall thickening with perirectal fat stranding suggesting proctitis  Last ZHY:QMVH Last Colonoscopy:01/2023  - Preparation of the colon was inadequate. - Palpable rectal ulcer found on digital rectal exam on the posterior wall.   - Stool in the entire examined colon.  - Cratered large Mucosal ulceration in the rectum likely the source of bleeding. Biopsied.  - Exam was inadequate even after large amount of lavage and manual disimpaction   - No overt bleeding seen throughout the colon  A. RECTAL ULCER, BIOPSY:  Acute colitis with nonspecific ulcer and hyperplastic change (see  comment)   FHx: neg for any gastrointestinal/liver disease, no malignancies Social: neg smoking, alcohol or illicit drug use   Past Medical History: Past Medical History:  Diagnosis Date   Anemia    Arthritis     Cardiomyopathy (HCC)    July 2022   Diabetes mellitus (HCC)    GERD (gastroesophageal reflux disease)    HTN (hypertension)    Hyperlipidemia    Nephrotic syndrome    Obesity    Pulmonary hypertension (HCC)    July 2022 in setting of nephrotic syndrome   Secondary hyperparathyroidism of renal origin [facility])     Past Surgical History: Past Surgical History:  Procedure Laterality Date   AV FISTULA PLACEMENT Left 12/27/2022   Procedure: INSERTION OF LEFT ARM ARTERIOVENOUS (AV) GORE-TEX GRAFT;  Surgeon: Chuck Hint, MD;  Location: [facility] OR;  Service: Vascular;  Laterality: Left;   BIOPSY  01/13/2023   Procedure: BIOPSY;  Surgeon: Franky Macho, MD;  Location: [facility] END[facility] SUITE;  Service: Endoscopy;;   CATARACT EXTRACTION, BILATERAL  2018   COLONOSCOPY WITH PROPOFOL N/A 01/13/2023   Procedure: COLONOSCOPY WITH PROPOFOL;  Surgeon: Franky Macho, MD;  Location: [facility] END[facility] SUITE;  Service: Endoscopy;  Laterality: N/A;   INSERTION OF DIALYSIS CATHETER Right 12/27/2022   Procedure: INSERTION OF Right Internal Jugular  DIALYSIS CATHETER;  Surgeon: Chuck Hint, MD;  Location: [facility] OR;  Service: Vascular;  Laterality: Right;   TONSILLECTOMY      Family History: Family History  Problem Relation Age of Onset   Hypertension Mother    Diabetes Mother    Cancer Father        prostate cancer   Hypertension Maternal Grandfather    Rheum arthritis Maternal Grandmother    CAD Neg Hx     Social History: Social History  Tobacco Use  Smoking Status Never  Smokeless Tobacco Never   Social History   Substance and Sexual Activity  Alcohol Use Never   Social History   Substance and Sexual Activity  Drug Use Never    Allergies: No Known Allergies  Medications: Current Outpatient Medications  Medication Sig Dispense Refill   Accu-Chek Softclix Lancets lancets      acetaminophen (TYLENOL) 500 MG tablet Take 500 mg by mouth every 6 (six) hours as needed (arthritis).       amitriptyline (ELAVIL) 10 MG tablet Take 10 mg by mouth at bedtime.     amLODipine (NORVASC) 5 MG tablet Take 5 mg by mouth every morning.     atorvastatin (LIPITOR) 20 MG tablet Take 20 mg by mouth every morning.     Blood Glucose Monitoring Suppl (ACCU-CHEK GUIDE) w/Device KIT in the morning.     docusate sodium (COLACE) 100 MG capsule Take 1 capsule (100 mg total) by mouth 2 (two) times daily. 60 capsule 2   famotidine (PEPCID) 20 MG tablet Take 1 tablet (20 mg total) by mouth at bedtime.     gabapentin (NEURONTIN) 100 MG capsule Take 100 mg by mouth at bedtime.     hydrALAZINE (APRESOLINE) 25 MG tablet Take 1 tablet (25 mg total) by mouth every 8 (eight) hours. (Patient taking differently: Take 50 mg by mouth 2 (two) times daily.) 90 tablet 3   levothyroxine (SYNTHROID) 50 MCG tablet Take 50 mcg by mouth daily before breakfast.     metoprolol tartrate (LOPRESSOR) 50 MG tablet Take 1 tablet (50 mg total) by mouth 2 (two) times daily. 60 tablet 0   pantoprazole (PROTONIX) 40 MG tablet Take 1 tablet (40 mg total) by mouth daily. 30 tablet 1   polyethylene glycol (MIRALAX) 17 g packet Take 17 g by mouth 2 (two) times daily. 60 each 0   vitamin B-12 (CYANOCOBALAMIN) 1000 MCG tablet Take 1,000 mcg by mouth at bedtime.     No current facility-administered medications for this visit.    Review of Systems: GENERAL: negative for malaise, night sweats HEENT: No changes in hearing or vision, no nose bleeds or other nasal problems. NECK: Negative for lumps, goiter, pain and significant neck swelling RESPIRATORY: Negative for cough, wheezing CARDIOVASCULAR: Negative for chest pain, leg swelling, palpitations, orthopnea GI: SEE HPI MUSCULOSKELETAL: Negative for joint pain or swelling, back pain, and muscle pain. SKIN: Negative for lesions, rash HEMATOLOGY Negative for prolonged bleeding, bruising easily, and swollen nodes. ENDOCRINE: Negative for cold or heat intolerance, polyuria, polydipsia and  goiter. NEURO: negative for tremor, gait imbalance, syncope and seizures. The remainder of the review of systems is noncontributory.   Physical Exam: There were no vitals taken for this visit. GENERAL: The patient is AO x3, in no acute distress. HEENT: Head is normocephalic and atraumatic. EOMI are intact. Mouth is well hydrated and without lesions. NECK: Supple. No masses LUNGS: Clear to auscultation. No presence of rhonchi/wheezing/rales. Adequate chest expansion HEART: RRR, normal s1 and s2. ABDOMEN: Soft, nontender, no guarding, no peritoneal signs, and nondistended. BS +. No masses. EXTREMITIES: Without any cyanosis, clubbing, rash, lesions or edema. NEUROLOGIC: AOx3, no focal motor deficit. SKIN: no jaundice, no rashes   Imaging/Labs: as above     Latest Ref Rng & Units 02/20/2023    7:32 PM 01/14/2023   12:22 PM 01/14/2023    4:16 AM  CBC  WBC 4.0 - 10.5 K/uL 6.0  8.3  6.8   Hemoglobin 13.0 - 17.0  g/dL 42.5  9.3  6.8   Hematocrit 39.0 - 52.0 % 32.7  27.9  20.9   Platelets 150 - 400 K/uL 96  144  144    Lab Results  Component Value Date   IRON 52 01/13/2023   TIBC 129 (L) 01/13/2023   FERRITIN 267 01/13/2023    I personally reviewed and interpreted the available labs, imaging and endoscopic files.  Impression and Plan:  Tajiddin Schielke is a 62 y.o. male with cardiomyopathy, DM, GERD, HTN, pulmonary HTN, ESRD on HD ( MWF), was seen recently in the hospital with blood per rectum s/p Colonoscopy found to have large rectal ulcer .  Patient is here in the GI clinic for evaluation of rectal ulcer, liver and pancreatic lesion  #Rectal ulcer  This could be stercoral colitis or solitary rectal ulcer but malignancy still needs to be ruled out  Large rectal ulcer and biopsies were not diagnostic .   Recommendation :  Strongly recommend repeat colonoscopy with extended bowel prep to make sure there is no cancer have a complete colonoscopy with removal of any  polyps  #Constipation  Follow a high fiber diet: Include foods such as dates, prunes, pears, and kiwi. -Avoid constipation to heal the rectal ulcer  : Miralax BID and Metamucil BID  #IPMN   CT with Incidental cystic lesions within the pancreas, superimposed upon background fatty pancreatic atrophy. Largest in the pancreatic tail measures 12 mm.    Recs:  -CT abdomen pancreatic protocol.  MRI will not be an option for patient as he is end-stage renal disease   #Liver lesion  CT with Indeterminate 1.4 cm hypodensity left lobe liver,  AFP<1.8  -Ct Abdomen Triple phase  All questions were answered.      [name] Lawman, MD Gastroenterology and Hepatology [facility] Gastroenterology   This chart has been completed using [employer] Dictation software, and while attempts have been made to ensure accuracy , certain words and phrases may not be transcribed as intended

## 2023-02-28 NOTE — Addendum Note (Signed)
Addended by: Marlowe Shores on: 02/28/2023 03:03 PM   Modules accepted: Orders

## 2023-03-01 DIAGNOSIS — Z992 Dependence on renal dialysis: Secondary | ICD-10-CM | POA: Diagnosis not present

## 2023-03-01 DIAGNOSIS — N2581 Secondary hyperparathyroidism of renal origin: Secondary | ICD-10-CM | POA: Diagnosis not present

## 2023-03-01 DIAGNOSIS — N186 End stage renal disease: Secondary | ICD-10-CM | POA: Diagnosis not present

## 2023-03-03 DIAGNOSIS — Z992 Dependence on renal dialysis: Secondary | ICD-10-CM | POA: Diagnosis not present

## 2023-03-03 DIAGNOSIS — T82898A Other specified complication of vascular prosthetic devices, implants and grafts, initial encounter: Secondary | ICD-10-CM | POA: Diagnosis not present

## 2023-03-03 DIAGNOSIS — N186 End stage renal disease: Secondary | ICD-10-CM | POA: Diagnosis not present

## 2023-03-06 ENCOUNTER — Emergency Department (HOSPITAL_COMMUNITY): Payer: Medicare HMO

## 2023-03-06 ENCOUNTER — Other Ambulatory Visit: Payer: Self-pay

## 2023-03-06 ENCOUNTER — Inpatient Hospital Stay (HOSPITAL_COMMUNITY): Payer: Medicare HMO

## 2023-03-06 ENCOUNTER — Inpatient Hospital Stay (HOSPITAL_COMMUNITY)
Admission: EM | Admit: 2023-03-06 | Discharge: 2023-03-29 | DRG: 981 | Disposition: A | Discharge status: Skilled Nursing Facility | Payer: Medicare HMO | Attending: Internal Medicine | Admitting: Internal Medicine

## 2023-03-06 ENCOUNTER — Encounter (HOSPITAL_COMMUNITY): Payer: Self-pay

## 2023-03-06 DIAGNOSIS — I10 Essential (primary) hypertension: Secondary | ICD-10-CM | POA: Diagnosis not present

## 2023-03-06 DIAGNOSIS — M79603 Pain in arm, unspecified: Secondary | ICD-10-CM | POA: Diagnosis not present

## 2023-03-06 DIAGNOSIS — I959 Hypotension, unspecified: Secondary | ICD-10-CM | POA: Diagnosis not present

## 2023-03-06 DIAGNOSIS — M6281 Muscle weakness (generalized): Secondary | ICD-10-CM | POA: Diagnosis not present

## 2023-03-06 DIAGNOSIS — R918 Other nonspecific abnormal finding of lung field: Secondary | ICD-10-CM | POA: Diagnosis not present

## 2023-03-06 DIAGNOSIS — J90 Pleural effusion, not elsewhere classified: Secondary | ICD-10-CM | POA: Diagnosis not present

## 2023-03-06 DIAGNOSIS — T827XXA Infection and inflammatory reaction due to other cardiac and vascular devices, implants and grafts, initial encounter: Secondary | ICD-10-CM

## 2023-03-06 DIAGNOSIS — Z8261 Family history of arthritis: Secondary | ICD-10-CM

## 2023-03-06 DIAGNOSIS — W06XXXA Fall from bed, initial encounter: Secondary | ICD-10-CM | POA: Diagnosis not present

## 2023-03-06 DIAGNOSIS — I059 Rheumatic mitral valve disease, unspecified: Secondary | ICD-10-CM | POA: Diagnosis present

## 2023-03-06 DIAGNOSIS — G9349 Other encephalopathy: Secondary | ICD-10-CM | POA: Diagnosis present

## 2023-03-06 DIAGNOSIS — E8809 Other disorders of plasma-protein metabolism, not elsewhere classified: Secondary | ICD-10-CM | POA: Diagnosis not present

## 2023-03-06 DIAGNOSIS — J9811 Atelectasis: Secondary | ICD-10-CM | POA: Diagnosis not present

## 2023-03-06 DIAGNOSIS — D631 Anemia in chronic kidney disease: Secondary | ICD-10-CM | POA: Diagnosis present

## 2023-03-06 DIAGNOSIS — Z8042 Family history of malignant neoplasm of prostate: Secondary | ICD-10-CM

## 2023-03-06 DIAGNOSIS — R0689 Other abnormalities of breathing: Secondary | ICD-10-CM | POA: Diagnosis not present

## 2023-03-06 DIAGNOSIS — I330 Acute and subacute infective endocarditis: Secondary | ICD-10-CM | POA: Diagnosis present

## 2023-03-06 DIAGNOSIS — Z9842 Cataract extraction status, left eye: Secondary | ICD-10-CM

## 2023-03-06 DIAGNOSIS — Z7401 Bed confinement status: Secondary | ICD-10-CM | POA: Diagnosis not present

## 2023-03-06 DIAGNOSIS — N186 End stage renal disease: Secondary | ICD-10-CM | POA: Diagnosis present

## 2023-03-06 DIAGNOSIS — M6259 Muscle wasting and atrophy, not elsewhere classified, multiple sites: Secondary | ICD-10-CM | POA: Diagnosis not present

## 2023-03-06 DIAGNOSIS — G629 Polyneuropathy, unspecified: Secondary | ICD-10-CM

## 2023-03-06 DIAGNOSIS — J1282 Pneumonia due to coronavirus disease 2019: Secondary | ICD-10-CM | POA: Diagnosis present

## 2023-03-06 DIAGNOSIS — R7881 Bacteremia: Secondary | ICD-10-CM | POA: Diagnosis not present

## 2023-03-06 DIAGNOSIS — E1122 Type 2 diabetes mellitus with diabetic chronic kidney disease: Secondary | ICD-10-CM | POA: Diagnosis present

## 2023-03-06 DIAGNOSIS — D638 Anemia in other chronic diseases classified elsewhere: Secondary | ICD-10-CM | POA: Diagnosis present

## 2023-03-06 DIAGNOSIS — Z79899 Other long term (current) drug therapy: Secondary | ICD-10-CM

## 2023-03-06 DIAGNOSIS — E43 Unspecified severe protein-calorie malnutrition: Secondary | ICD-10-CM | POA: Diagnosis not present

## 2023-03-06 DIAGNOSIS — I272 Pulmonary hypertension, unspecified: Secondary | ICD-10-CM | POA: Diagnosis present

## 2023-03-06 DIAGNOSIS — I38 Endocarditis, valve unspecified: Secondary | ICD-10-CM | POA: Diagnosis not present

## 2023-03-06 DIAGNOSIS — E876 Hypokalemia: Secondary | ICD-10-CM | POA: Diagnosis present

## 2023-03-06 DIAGNOSIS — R41841 Cognitive communication deficit: Secondary | ICD-10-CM | POA: Diagnosis not present

## 2023-03-06 DIAGNOSIS — J329 Chronic sinusitis, unspecified: Secondary | ICD-10-CM | POA: Diagnosis not present

## 2023-03-06 DIAGNOSIS — Z781 Physical restraint status: Secondary | ICD-10-CM

## 2023-03-06 DIAGNOSIS — Z992 Dependence on renal dialysis: Secondary | ICD-10-CM

## 2023-03-06 DIAGNOSIS — B9562 Methicillin resistant Staphylococcus aureus infection as the cause of diseases classified elsewhere: Secondary | ICD-10-CM

## 2023-03-06 DIAGNOSIS — M25521 Pain in right elbow: Secondary | ICD-10-CM | POA: Diagnosis not present

## 2023-03-06 DIAGNOSIS — T827XXS Infection and inflammatory reaction due to other cardiac and vascular devices, implants and grafts, sequela: Secondary | ICD-10-CM | POA: Diagnosis not present

## 2023-03-06 DIAGNOSIS — Z87441 Personal history of nephrotic syndrome: Secondary | ICD-10-CM

## 2023-03-06 DIAGNOSIS — K219 Gastro-esophageal reflux disease without esophagitis: Secondary | ICD-10-CM | POA: Diagnosis present

## 2023-03-06 DIAGNOSIS — R600 Localized edema: Secondary | ICD-10-CM | POA: Diagnosis not present

## 2023-03-06 DIAGNOSIS — Z23 Encounter for immunization: Secondary | ICD-10-CM

## 2023-03-06 DIAGNOSIS — I120 Hypertensive chronic kidney disease with stage 5 chronic kidney disease or end stage renal disease: Secondary | ICD-10-CM | POA: Diagnosis present

## 2023-03-06 DIAGNOSIS — M19021 Primary osteoarthritis, right elbow: Secondary | ICD-10-CM | POA: Diagnosis not present

## 2023-03-06 DIAGNOSIS — R9389 Abnormal findings on diagnostic imaging of other specified body structures: Secondary | ICD-10-CM | POA: Diagnosis not present

## 2023-03-06 DIAGNOSIS — I5043 Acute on chronic combined systolic (congestive) and diastolic (congestive) heart failure: Secondary | ICD-10-CM | POA: Diagnosis not present

## 2023-03-06 DIAGNOSIS — F05 Delirium due to known physiological condition: Secondary | ICD-10-CM | POA: Diagnosis not present

## 2023-03-06 DIAGNOSIS — D696 Thrombocytopenia, unspecified: Secondary | ICD-10-CM | POA: Diagnosis not present

## 2023-03-06 DIAGNOSIS — N2581 Secondary hyperparathyroidism of renal origin: Secondary | ICD-10-CM | POA: Diagnosis present

## 2023-03-06 DIAGNOSIS — R410 Disorientation, unspecified: Secondary | ICD-10-CM

## 2023-03-06 DIAGNOSIS — N390 Urinary tract infection, site not specified: Secondary | ICD-10-CM | POA: Diagnosis present

## 2023-03-06 DIAGNOSIS — G9341 Metabolic encephalopathy: Secondary | ICD-10-CM | POA: Diagnosis not present

## 2023-03-06 DIAGNOSIS — Z833 Family history of diabetes mellitus: Secondary | ICD-10-CM

## 2023-03-06 DIAGNOSIS — E1140 Type 2 diabetes mellitus with diabetic neuropathy, unspecified: Secondary | ICD-10-CM | POA: Diagnosis present

## 2023-03-06 DIAGNOSIS — F0390 Unspecified dementia without behavioral disturbance: Secondary | ICD-10-CM | POA: Diagnosis present

## 2023-03-06 DIAGNOSIS — Z681 Body mass index (BMI) 19 or less, adult: Secondary | ICD-10-CM | POA: Diagnosis not present

## 2023-03-06 DIAGNOSIS — G9340 Encephalopathy, unspecified: Secondary | ICD-10-CM | POA: Diagnosis not present

## 2023-03-06 DIAGNOSIS — I509 Heart failure, unspecified: Secondary | ICD-10-CM | POA: Diagnosis not present

## 2023-03-06 DIAGNOSIS — R4182 Altered mental status, unspecified: Secondary | ICD-10-CM | POA: Diagnosis not present

## 2023-03-06 DIAGNOSIS — S80211A Abrasion, right knee, initial encounter: Secondary | ICD-10-CM | POA: Diagnosis not present

## 2023-03-06 DIAGNOSIS — Y92230 Patient room in hospital as the place of occurrence of the external cause: Secondary | ICD-10-CM | POA: Diagnosis not present

## 2023-03-06 DIAGNOSIS — G9389 Other specified disorders of brain: Secondary | ICD-10-CM | POA: Diagnosis not present

## 2023-03-06 DIAGNOSIS — T80211A Bloodstream infection due to central venous catheter, initial encounter: Secondary | ICD-10-CM | POA: Diagnosis not present

## 2023-03-06 DIAGNOSIS — Y832 Surgical operation with anastomosis, bypass or graft as the cause of abnormal reaction of the patient, or of later complication, without mention of misadventure at the time of the procedure: Secondary | ICD-10-CM | POA: Diagnosis present

## 2023-03-06 DIAGNOSIS — I429 Cardiomyopathy, unspecified: Secondary | ICD-10-CM | POA: Diagnosis present

## 2023-03-06 DIAGNOSIS — Z9841 Cataract extraction status, right eye: Secondary | ICD-10-CM

## 2023-03-06 DIAGNOSIS — E039 Hypothyroidism, unspecified: Secondary | ICD-10-CM | POA: Diagnosis present

## 2023-03-06 DIAGNOSIS — R262 Difficulty in walking, not elsewhere classified: Secondary | ICD-10-CM | POA: Diagnosis not present

## 2023-03-06 DIAGNOSIS — U071 COVID-19: Principal | ICD-10-CM | POA: Diagnosis present

## 2023-03-06 DIAGNOSIS — R0989 Other specified symptoms and signs involving the circulatory and respiratory systems: Secondary | ICD-10-CM | POA: Diagnosis not present

## 2023-03-06 DIAGNOSIS — Z741 Need for assistance with personal care: Secondary | ICD-10-CM | POA: Diagnosis not present

## 2023-03-06 DIAGNOSIS — B952 Enterococcus as the cause of diseases classified elsewhere: Secondary | ICD-10-CM | POA: Diagnosis not present

## 2023-03-06 DIAGNOSIS — R296 Repeated falls: Secondary | ICD-10-CM | POA: Diagnosis present

## 2023-03-06 DIAGNOSIS — Z79890 Hormone replacement therapy (postmenopausal): Secondary | ICD-10-CM

## 2023-03-06 DIAGNOSIS — Z91158 Patient's noncompliance with renal dialysis for other reason: Secondary | ICD-10-CM

## 2023-03-06 DIAGNOSIS — K862 Cyst of pancreas: Secondary | ICD-10-CM | POA: Diagnosis not present

## 2023-03-06 DIAGNOSIS — Z452 Encounter for adjustment and management of vascular access device: Secondary | ICD-10-CM | POA: Diagnosis not present

## 2023-03-06 DIAGNOSIS — Z789 Other specified health status: Secondary | ICD-10-CM

## 2023-03-06 DIAGNOSIS — E785 Hyperlipidemia, unspecified: Secondary | ICD-10-CM | POA: Diagnosis present

## 2023-03-06 DIAGNOSIS — I350 Nonrheumatic aortic (valve) stenosis: Secondary | ICD-10-CM | POA: Diagnosis not present

## 2023-03-06 DIAGNOSIS — Z8249 Family history of ischemic heart disease and other diseases of the circulatory system: Secondary | ICD-10-CM

## 2023-03-06 DIAGNOSIS — R2681 Unsteadiness on feet: Secondary | ICD-10-CM | POA: Diagnosis not present

## 2023-03-06 DIAGNOSIS — M7989 Other specified soft tissue disorders: Secondary | ICD-10-CM | POA: Diagnosis not present

## 2023-03-06 DIAGNOSIS — D649 Anemia, unspecified: Secondary | ICD-10-CM | POA: Diagnosis not present

## 2023-03-06 DIAGNOSIS — I132 Hypertensive heart and chronic kidney disease with heart failure and with stage 5 chronic kidney disease, or end stage renal disease: Secondary | ICD-10-CM | POA: Diagnosis not present

## 2023-03-06 DIAGNOSIS — R4189 Other symptoms and signs involving cognitive functions and awareness: Secondary | ICD-10-CM | POA: Diagnosis present

## 2023-03-06 LAB — URINALYSIS, ROUTINE W REFLEX MICROSCOPIC
Bacteria, UA: NONE SEEN
Bilirubin Urine: NEGATIVE
Glucose, UA: NEGATIVE mg/dL
Ketones, ur: NEGATIVE mg/dL
Nitrite: NEGATIVE
Protein, ur: >=300 mg/dL — AB
RBC / HPF: >50 RBC/hpf (ref 0–5)
Specific Gravity, Urine: 1.019 (ref 1.005–1.030)
WBC, UA: >50 WBC/hpf (ref 0–5)
pH: 5 (ref 5.0–8.0)

## 2023-03-06 LAB — GLUCOSE, CAPILLARY
Glucose-Capillary: 95 mg/dL (ref 70–99)
Glucose-Capillary: 130 mg/dL — ABNORMAL HIGH (ref 70–99)

## 2023-03-06 LAB — PROCALCITONIN: Procalcitonin: 2.13 ng/mL

## 2023-03-06 LAB — I-STAT CHEM 8, ED
BUN: 20 mg/dL (ref 8–23)
Calcium, Ion: 0.94 mmol/L — ABNORMAL LOW (ref 1.15–1.40)
Chloride: 100 mmol/L (ref 98–111)
Creatinine, Ser: 6.9 mg/dL — ABNORMAL HIGH (ref 0.61–1.24)
Glucose, Bld: 99 mg/dL (ref 70–99)
HCT: 42 % (ref 39.0–52.0)
Hemoglobin: 14.3 g/dL (ref 13.0–17.0)
Potassium: 3.2 mmol/L — ABNORMAL LOW (ref 3.5–5.1)
Sodium: 138 mmol/L (ref 135–145)
TCO2: 27 mmol/L (ref 22–32)

## 2023-03-06 LAB — CBC WITH DIFFERENTIAL/PLATELET
Abs Immature Granulocytes: 0.03 10*3/uL (ref 0.00–0.07)
Basophils Absolute: 0 10*3/uL (ref 0.0–0.1)
Basophils Relative: 1 %
Eosinophils Absolute: 0 10*3/uL (ref 0.0–0.5)
Eosinophils Relative: 0 %
HCT: 41.9 % (ref 39.0–52.0)
Hemoglobin: 13.4 g/dL (ref 13.0–17.0)
Immature Granulocytes: 1 %
Lymphocytes Relative: 21 %
Lymphs Abs: 1.1 10*3/uL (ref 0.7–4.0)
MCH: 28.6 pg (ref 26.0–34.0)
MCHC: 32 g/dL (ref 30.0–36.0)
MCV: 89.5 fL (ref 80.0–100.0)
Monocytes Absolute: 0.1 10*3/uL (ref 0.1–1.0)
Monocytes Relative: 3 %
Neutro Abs: 4 10*3/uL (ref 1.7–7.7)
Neutrophils Relative %: 74 %
Platelets: 99 10*3/uL — ABNORMAL LOW (ref 150–400)
RBC: 4.68 MIL/uL (ref 4.22–5.81)
RDW: 15 % (ref 11.5–15.5)
WBC: 5.3 10*3/uL (ref 4.0–10.5)
nRBC: 0 % (ref 0.0–0.2)

## 2023-03-06 LAB — COMPREHENSIVE METABOLIC PANEL
ALT: 17 U/L (ref 0–44)
AST: 21 U/L (ref 15–41)
Albumin: 2.6 g/dL — ABNORMAL LOW (ref 3.5–5.0)
Alkaline Phosphatase: 154 U/L — ABNORMAL HIGH (ref 38–126)
Anion gap: 14 (ref 5–15)
BUN: 18 mg/dL (ref 8–23)
CO2: 27 mmol/L (ref 22–32)
Calcium: 7.9 mg/dL — ABNORMAL LOW (ref 8.9–10.3)
Chloride: 96 mmol/L — ABNORMAL LOW (ref 98–111)
Creatinine, Ser: 5.98 mg/dL — ABNORMAL HIGH (ref 0.61–1.24)
GFR, Estimated: 10 mL/min — ABNORMAL LOW (ref >60)
Glucose, Bld: 99 mg/dL (ref 70–99)
Potassium: 3 mmol/L — ABNORMAL LOW (ref 3.5–5.1)
Sodium: 137 mmol/L (ref 135–145)
Total Bilirubin: 1.6 mg/dL — ABNORMAL HIGH (ref 0.3–1.2)
Total Protein: 6.5 g/dL (ref 6.5–8.1)

## 2023-03-06 LAB — MRSA NEXT GEN BY PCR, NASAL: MRSA by PCR Next Gen: DETECTED — AB

## 2023-03-06 LAB — BLOOD GAS, VENOUS
Acid-Base Excess: 8.5 mmol/L — ABNORMAL HIGH (ref 0.0–2.0)
Bicarbonate: 32.8 mmol/L — ABNORMAL HIGH (ref 20.0–28.0)
Drawn by: 1854
O2 Saturation: 49.3 %
Patient temperature: 37.3
pCO2, Ven: 44 mmHg (ref 44–60)
pH, Ven: 7.49 — ABNORMAL HIGH (ref 7.25–7.43)
pO2, Ven: 31 mmHg — CL (ref 32–45)

## 2023-03-06 LAB — ETHANOL: Alcohol, Ethyl (B): <10 mg/dL (ref <10)

## 2023-03-06 LAB — PROTIME-INR
INR: 1.1 (ref 0.8–1.2)
Prothrombin Time: 14.4 seconds (ref 11.4–15.2)

## 2023-03-06 LAB — LACTIC ACID, PLASMA: Lactic Acid, Venous: 1.2 mmol/L (ref 0.5–1.9)

## 2023-03-06 LAB — SARS CORONAVIRUS 2 BY RT PCR: SARS Coronavirus 2 by RT PCR: POSITIVE — AB

## 2023-03-06 LAB — CBG MONITORING, ED: Glucose-Capillary: 90 mg/dL (ref 70–99)

## 2023-03-06 LAB — AMMONIA: Ammonia: 15 umol/L (ref 9–35)

## 2023-03-06 MED ORDER — AMLODIPINE BESYLATE 5 MG PO TABS
5.0000 mg | ORAL_TABLET | Freq: Every morning | ORAL | Status: DC
  Administered 2023-03-10 – 2023-03-18 (×8): 5 mg via ORAL
  Filled 2023-03-06 (×10): qty 1

## 2023-03-06 MED ORDER — ATORVASTATIN CALCIUM 10 MG PO TABS
20.0000 mg | ORAL_TABLET | Freq: Every morning | ORAL | Status: DC
  Administered 2023-03-07 – 2023-03-29 (×21): 20 mg via ORAL
  Filled 2023-03-06 (×7): qty 2
  Filled 2023-03-06: qty 1
  Filled 2023-03-06 (×4): qty 2
  Filled 2023-03-06: qty 1
  Filled 2023-03-06 (×5): qty 2
  Filled 2023-03-06: qty 1
  Filled 2023-03-06 (×3): qty 2

## 2023-03-06 MED ORDER — SODIUM CHLORIDE 0.9 % IV SOLN
100.0000 mg | Freq: Every day | INTRAVENOUS | Status: AC
  Administered 2023-03-07 – 2023-03-08 (×2): 100 mg via INTRAVENOUS
  Filled 2023-03-06: qty 20
  Filled 2023-03-06: qty 100

## 2023-03-06 MED ORDER — SODIUM CHLORIDE 0.9 % IV SOLN
100.0000 mg | INTRAVENOUS | Status: AC
  Administered 2023-03-06 (×2): 100 mg via INTRAVENOUS
  Filled 2023-03-06 (×2): qty 100

## 2023-03-06 MED ORDER — SODIUM CHLORIDE 0.9% FLUSH
3.0000 mL | Freq: Two times a day (BID) | INTRAVENOUS | Status: DC
  Administered 2023-03-06 – 2023-03-23 (×25): 3 mL via INTRAVENOUS

## 2023-03-06 MED ORDER — ACETAMINOPHEN 325 MG PO TABS
650.0000 mg | ORAL_TABLET | Freq: Four times a day (QID) | ORAL | Status: DC | PRN
  Administered 2023-03-06 – 2023-03-11 (×3): 650 mg via ORAL
  Filled 2023-03-06 (×3): qty 2

## 2023-03-06 MED ORDER — ACETAMINOPHEN 650 MG RE SUPP: 650.0000 mg | Freq: Four times a day (QID) | RECTAL | Status: DC | PRN

## 2023-03-06 MED ORDER — INSULIN ASPART 100 UNIT/ML IJ SOLN: 0.0000 [IU] | Freq: Every day | INTRAMUSCULAR | Status: DC

## 2023-03-06 MED ORDER — METOPROLOL TARTRATE 50 MG PO TABS
50.0000 mg | ORAL_TABLET | Freq: Two times a day (BID) | ORAL | Status: DC
  Administered 2023-03-06 – 2023-03-29 (×37): 50 mg via ORAL
  Filled 2023-03-06 (×40): qty 1
  Filled 2023-03-06: qty 2
  Filled 2023-03-06: qty 1

## 2023-03-06 MED ORDER — ONDANSETRON HCL 4 MG/2ML IJ SOLN: 4.0000 mg | Freq: Four times a day (QID) | INTRAMUSCULAR | Status: DC | PRN

## 2023-03-06 MED ORDER — SODIUM CHLORIDE 0.9 % IV SOLN: 100.0000 mg | Freq: Every day | INTRAVENOUS | Status: DC

## 2023-03-06 MED ORDER — SODIUM CHLORIDE 0.9 % IV SOLN: 200.0000 mg | Freq: Once | INTRAVENOUS | Status: DC

## 2023-03-06 MED ORDER — LEVOTHYROXINE SODIUM 50 MCG PO TABS
50.0000 ug | ORAL_TABLET | Freq: Every day | ORAL | Status: DC
  Administered 2023-03-07 – 2023-03-08 (×2): 50 ug via ORAL
  Filled 2023-03-06 (×2): qty 1

## 2023-03-06 MED ORDER — PANTOPRAZOLE SODIUM 40 MG PO TBEC
40.0000 mg | DELAYED_RELEASE_TABLET | Freq: Every day | ORAL | Status: DC
  Administered 2023-03-07 – 2023-03-29 (×21): 40 mg via ORAL
  Filled 2023-03-06 (×22): qty 1

## 2023-03-06 MED ORDER — SODIUM CHLORIDE 0.9 % IV SOLN
1.0000 g | INTRAVENOUS | Status: DC
  Administered 2023-03-06: 1 g via INTRAVENOUS
  Filled 2023-03-06: qty 10

## 2023-03-06 MED ORDER — CHLORHEXIDINE GLUCONATE CLOTH 2 % EX PADS
6.0000 | MEDICATED_PAD | Freq: Every day | CUTANEOUS | Status: DC
  Administered 2023-03-07 – 2023-03-16 (×5): 6 via TOPICAL

## 2023-03-06 MED ORDER — ONDANSETRON HCL 4 MG PO TABS: 4.0000 mg | ORAL_TABLET | Freq: Four times a day (QID) | ORAL | Status: DC | PRN

## 2023-03-06 MED ORDER — NEPRO/CARBSTEADY PO LIQD: 237.0000 mL | Freq: Two times a day (BID) | ORAL | Status: DC

## 2023-03-06 MED ORDER — ACETAMINOPHEN 650 MG RE SUPP
650.0000 mg | Freq: Once | RECTAL | Status: AC
  Administered 2023-03-06: 650 mg via RECTAL

## 2023-03-06 MED ORDER — ADULT MULTIVITAMIN W/MINERALS CH
1.0000 | ORAL_TABLET | Freq: Every day | ORAL | Status: DC
  Administered 2023-03-06 – 2023-03-29 (×21): 1 via ORAL
  Filled 2023-03-06 (×22): qty 1

## 2023-03-06 MED ORDER — SODIUM CHLORIDE 0.9% FLUSH: 3.0000 mL | INTRAVENOUS | Status: DC | PRN

## 2023-03-06 MED ORDER — SODIUM CHLORIDE 0.9 % IV SOLN: 250.0000 mL | INTRAVENOUS | Status: DC | PRN

## 2023-03-06 MED ORDER — INSULIN ASPART 100 UNIT/ML IJ SOLN
0.0000 [IU] | Freq: Three times a day (TID) | INTRAMUSCULAR | Status: DC
  Administered 2023-03-08 – 2023-03-12 (×7): 1 [IU] via SUBCUTANEOUS
  Administered 2023-03-13: 2 [IU] via SUBCUTANEOUS
  Administered 2023-03-14 – 2023-03-26 (×13): 1 [IU] via SUBCUTANEOUS
  Administered 2023-03-27: 5 [IU] via SUBCUTANEOUS
  Administered 2023-03-28: 1 [IU] via SUBCUTANEOUS
  Administered 2023-03-28: 3 [IU] via SUBCUTANEOUS

## 2023-03-06 NOTE — H&P (Signed)
History and Physical    [name] [name] ZOX:[id number] DOB: 02/05/1961 DOA: 03/06/2023  PCP: [name] Has, MD   Patient coming from: Home  Chief Complaint: AMS and missed HD  HPI: [name] [name] is a 62 y.o. male with medical history significant for ESRD on HD MWF, hypertension, type 2 diabetes, pulmonary hypertension, cardiomyopathy, GERD, dyslipidemia, and anemia of CKD who presented to the ED with worsening mental status.  His last hemodialysis was his last hemodialysis was Wednesday of last week and he apparently had what sounds like infiltration of his fistula this past Friday and therefore was not able to receive hemodialysis.  It sounds like he went to vascular surgery access center who stated not to use his fistula and he was supposed to have a make-up session of hemodialysis on Saturday, but was too well.  He was noted to have some fever as well as some shortness of breath.  He is now noted to have worsening overall symptoms as well as confusion.   ED Course: Vital signs demonstrating temperature 1-1.1 Fahrenheit, potassium of 3, creatinine 5.88, and chest x-ray with bilateral patchy opacities which are demonstrated to be pleural effusions with atelectasis on CT scan.  UA significant for UTI and he is also positive for COVID.  Currently not requiring oxygen supplementation.  Review of Systems: Reviewed as noted above, otherwise negative.  Past Medical History:  Diagnosis Date   Anemia    Arthritis    Cardiomyopathy [facility])    July 2022   Diabetes mellitus (HCC)    GERD (gastroesophageal reflux disease)    HTN (hypertension)    Hyperlipidemia    Nephrotic syndrome    Obesity    Pulmonary hypertension (HCC)    July 2022 in setting of nephrotic syndrome   Secondary hyperparathyroidism of renal origin [facility])     Past Surgical History:  Procedure Laterality Date   AV FISTULA PLACEMENT Left 12/27/2022   Procedure: INSERTION OF LEFT ARM ARTERIOVENOUS (AV) GORE-TEX GRAFT;  Surgeon:  Chuck Hint, MD;  Location: [facility] OR;  Service: Vascular;  Laterality: Left;   BIOPSY  01/13/2023   Procedure: BIOPSY;  Surgeon: Franky Macho, MD;  Location: [facility];  Service: Endoscopy;;   CATARACT EXTRACTION, BILATERAL  2018   COLONOSCOPY WITH PROPOFOL N/A 01/13/2023   Procedure: COLONOSCOPY WITH PROPOFOL;  Surgeon: Franky Macho, MD;  Location: [facility];  Service: Endoscopy;  Laterality: N/A;   INSERTION OF DIALYSIS CATHETER Right 12/27/2022   Procedure: INSERTION OF Right Internal Jugular  DIALYSIS CATHETER;  Surgeon: Chuck Hint, MD;  Location: [facility] OR;  Service: Vascular;  Laterality: Right;   TONSILLECTOMY       reports that he [name] never smoked. He [name] never used smokeless tobacco. He reports that he does not drink alcohol and does not use drugs.  No Known Allergies  Family History  Problem Relation Age of Onset   Hypertension Mother    Diabetes Mother    Cancer Father        prostate cancer   Hypertension Maternal Grandfather    Rheum arthritis Maternal Grandmother    CAD Neg Hx     Prior to Admission medications   Medication Sig Start Date End Date Taking? Authorizing Provider  Accu-Chek Softclix Lancets lancets  08/06/20   [provider]  acetaminophen (TYLENOL) 500 MG tablet Take 500 mg by mouth every 6 (six) hours as needed (arthritis).     [provider]  amitriptyline (ELAVIL) 10 MG  tablet Take 10 mg by mouth at bedtime. 05/24/22   [provider]  amLODipine (NORVASC) 5 MG tablet Take 5 mg by mouth every morning.    [provider]  atorvastatin (LIPITOR) 20 MG tablet Take 20 mg by mouth every morning. 09/23/20   [provider]  Blood Glucose Monitoring Suppl (ACCU-CHEK GUIDE) w/Device KIT in the morning. 08/06/20   [provider]  docusate sodium (COLACE) 100 MG capsule Take 1 capsule (100 mg total) by mouth 2 (two) times daily. 01/14/23 01/14/24  Vassie Loll, MD  famotidine  (PEPCID) 20 MG tablet Take 1 tablet (20 mg total) by mouth at bedtime. 01/14/23   Vassie Loll, MD  gabapentin (NEURONTIN) 100 MG capsule Take 100 mg by mouth at bedtime.    [provider]  hydrALAZINE (APRESOLINE) 25 MG tablet Take 1 tablet (25 mg total) by mouth every 8 (eight) hours. Patient not taking: Reported on 02/28/2023 09/10/20   Lewie Chamber, MD  levothyroxine (SYNTHROID) 50 MCG tablet Take 50 mcg by mouth daily before breakfast. 02/18/21   [provider]  metoprolol tartrate (LOPRESSOR) 50 MG tablet Take 1 tablet (50 mg total) by mouth 2 (two) times daily. 06/16/20   Jacquelin Hawking, PA-C  pantoprazole (PROTONIX) 40 MG tablet Take 1 tablet (40 mg total) by mouth daily. 01/14/23 01/14/24  Vassie Loll, MD  polyethylene glycol (MIRALAX / GLYCOLAX) 17 g packet Take 17 g by mouth 2 (two) times daily. 02/28/23 05/29/23  Franky Macho, MD  vitamin B-12 (CYANOCOBALAMIN) 1000 MCG tablet Take 1,000 mcg by mouth at bedtime.    [provider]    Physical Exam: Vitals:   03/06/23 1345 03/06/23 1400 03/06/23 1415 03/06/23 1515  BP: (!) 150/64 (!) 152/67 (!) 157/63 (!) 145/71  Pulse: 64 64 65 64  Resp:      Temp:    98.2 F (36.8 C)  TempSrc:    Oral  SpO2: 95% 95% 94% 96%  Weight:      Height:        Constitutional: NAD, calm, comfortable Vitals:   03/06/23 1345 03/06/23 1400 03/06/23 1415 03/06/23 1515  BP: (!) 150/64 (!) 152/67 (!) 157/63 (!) 145/71  Pulse: 64 64 65 64  Resp:      Temp:    98.2 F (36.8 C)  TempSrc:    Oral  SpO2: 95% 95% 94% 96%  Weight:      Height:       Eyes: lids and conjunctivae normal Neck: normal, supple Respiratory: clear to auscultation bilaterally. Normal respiratory effort. No accessory muscle use.  Cardiovascular: Regular rate and rhythm, no murmurs. Abdomen: no tenderness, no distention. Bowel sounds positive.  Musculoskeletal:  No edema. Skin: no rashes, lesions, ulcers.  Psychiatric: Flat affect  Labs on  Admission: I have personally reviewed following labs and imaging studies  CBC: Recent Labs  Lab 03/06/23 1017 03/06/23 1026  WBC  --  5.3  NEUTROABS  --  4.0  HGB 14.3 13.4  HCT 42.0 41.9  MCV  --  89.5  PLT  --  99*   Basic Metabolic Panel: Recent Labs  Lab 03/06/23 1017 03/06/23 1026  NA 138 137  K 3.2* 3.0*  CL 100 96*  CO2  --  27  GLUCOSE 99 99  BUN 20 18  CREATININE 6.90* 5.98*  CALCIUM  --  7.9*   GFR: Estimated Creatinine Clearance: 12.9 mL/min (A) (by C-G formula based on SCr of 5.98 mg/dL (H)). Liver Function  Tests: Recent Labs  Lab 03/06/23 1026  AST 21  ALT 17  ALKPHOS 154*  BILITOT 1.6*  PROT 6.5  ALBUMIN 2.6*   No results for input(s): "LIPASE", "AMYLASE" in the last 168 hours. Recent Labs  Lab 03/06/23 1026  AMMONIA 15   Coagulation Profile: Recent Labs  Lab 03/06/23 1026  INR 1.1   Cardiac Enzymes: No results for input(s): "CKTOTAL", "CKMB", "CKMBINDEX", "TROPONINI" in the last 168 hours. BNP (last 3 results) No results for input(s): "PROBNP" in the last 8760 hours. HbA1C: No results for input(s): "HGBA1C" in the last 72 hours. CBG: Recent Labs  Lab 03/06/23 0956  GLUCAP 90   Lipid Profile: No results for input(s): "CHOL", "HDL", "LDLCALC", "TRIG", "CHOLHDL", "LDLDIRECT" in the last 72 hours. Thyroid Function Tests: No results for input(s): "TSH", "T4TOTAL", "FREET4", "T3FREE", "THYROIDAB" in the last 72 hours. Anemia Panel: No results for input(s): "VITAMINB12", "FOLATE", "FERRITIN", "TIBC", "IRON", "RETICCTPCT" in the last 72 hours. Urine analysis:    Component Value Date/Time   COLORURINE AMBER (A) 03/06/2023 1036   APPEARANCEUR CLOUDY (A) 03/06/2023 1036   LABSPEC 1.019 03/06/2023 1036   PHURINE 5.0 03/06/2023 1036   GLUCOSEU NEGATIVE 03/06/2023 1036   HGBUR LARGE (A) 03/06/2023 1036   BILIRUBINUR NEGATIVE 03/06/2023 1036   KETONESUR NEGATIVE 03/06/2023 1036   PROTEINUR >=300 (A) 03/06/2023 1036   NITRITE NEGATIVE  03/06/2023 1036   LEUKOCYTESUR MODERATE (A) 03/06/2023 1036    Radiological Exams on Admission: CT CHEST ABDOMEN PELVIS WO CONTRAST  Result Date: 03/06/2023 CLINICAL DATA:  Altered mental status, possible sepsis. EXAM: CT CHEST, ABDOMEN AND PELVIS WITHOUT CONTRAST TECHNIQUE: Multidetector CT imaging of the chest, abdomen and pelvis was performed following the standard protocol without IV contrast. RADIATION DOSE REDUCTION: This exam was performed according to the departmental dose-optimization program which includes automated exposure control, adjustment of the mA and/or kV according to patient size and/or use of iterative reconstruction technique. COMPARISON:  January 11, 2023. FINDINGS: CT CHEST FINDINGS Cardiovascular: Atherosclerosis of thoracic aorta is noted without aneurysm formation. Normal cardiac size. No pericardial effusion. Mediastinum/Nodes: No enlarged mediastinal, hilar, or axillary lymph nodes. Thyroid gland, trachea, and esophagus demonstrate no significant findings. Lungs/Pleura: Small moderate size bilateral pleural effusions are noted with adjacent subsegmental atelectasis. No pneumothorax. Musculoskeletal: No chest wall mass or suspicious bone lesions identified. CT ABDOMEN PELVIS FINDINGS Hepatobiliary: No cholelithiasis or biliary dilatation. 11 mm left hepatic low density is again noted. Pancreas: Fatty replacement of the pancreas is noted. Cystic areas seen in pancreatic tail on prior exam are not well visualized currently. Spleen: Normal in size without focal abnormality. Adrenals/Urinary Tract: Adrenal glands and kidneys appear normal. No hydronephrosis or renal obstruction is noted. Urinary bladder is decompressed. Stomach/Bowel: Stomach is within normal limits. Appendix appears normal. No evidence of bowel wall thickening, distention, or inflammatory changes. Vascular/Lymphatic: Aortic atherosclerosis. No enlarged abdominal or pelvic lymph nodes. Reproductive: Prostate is  unremarkable. Other: No abdominal wall hernia or abnormality. No abdominopelvic ascites. Musculoskeletal: No acute or significant osseous findings. IMPRESSION: Small to moderate size bilateral pleural effusions are noted with minimal adjacent subsegmental atelectasis. 11 mm left hepatic low density is noted as described on prior CT scan. This may simply represent cyst or hemangioma, but other pathology cannot be excluded. When the patient is clinically stable and able to follow directions and hold their breath (preferably as an outpatient) further evaluation with dedicated abdominal MRI should be considered. Fatty replacement of the pancreas. Cystic areas seen in pancreatic tail on prior  exam are not well visualized currently. Aortic Atherosclerosis (ICD10-I70.0). Electronically Signed   By: Lupita Raider M.D.   On: 03/06/2023 14:21   CT Head Wo Contrast  Result Date: 03/06/2023 CLINICAL DATA:  Mental status change, unknown cause EXAM: CT HEAD WITHOUT CONTRAST TECHNIQUE: Contiguous axial images were obtained from the base of the skull through the vertex without intravenous contrast. RADIATION DOSE REDUCTION: This exam was performed according to the departmental dose-optimization program which includes automated exposure control, adjustment of the mA and/or kV according to patient size and/or use of iterative reconstruction technique. COMPARISON:  None Available. FINDINGS: Brain: There is atrophy and chronic small vessel disease changes. Associated mild ventriculomegaly related to ex vacuo dilatation. No acute intracranial abnormality. Specifically, no hemorrhage, hydrocephalus, mass lesion, acute infarction, or significant intracranial injury. Vascular: No hyperdense vessel or unexpected calcification. Skull: No acute calvarial abnormality. Sinuses/Orbits: No acute findings Other: No IMPRESSION: Atrophy, chronic microvascular disease. No acute intracranial abnormality. Electronically Signed   By: Charlett Nose  M.D.   On: 03/06/2023 13:29   DG Chest Port 1 View  Result Date: 03/06/2023 CLINICAL DATA:  Altered mental status EXAM: PORTABLE CHEST - 1 VIEW COMPARISON:  12/27/2022 FINDINGS: Cardiomediastinal silhouette and pulmonary vasculature are within normal limits. Interval removal of tunneled right IJ hemodialysis catheter. Mild patchy opacities at the lung bases are new since prior study. IMPRESSION: New mild patchy lung base opacities could be related to atelectasis or pneumonia. Electronically Signed   By: Acquanetta Belling M.D.   On: 03/06/2023 11:41    EKG: Independently reviewed. SR 71bpm.  Assessment/Plan Principal Problem:   Acute metabolic encephalopathy Active Problems:   Essential hypertension   Neuropathy   Anemia of chronic disease   Hypokalemia   Hypothyroidism   GERD (gastroesophageal reflux disease)   ESRD (end stage renal disease) (HCC)    Acute metabolic encephalopathy likely secondary to UTI/COVID and missed HD -Continue to monitor with treatment of UTI and Rocephin and follow cultures -Continue COVID treatment -Plan for hemodialysis in a.m. -Avoid sedating agents  COVID infection -Plan to start remdesivir -No need for steroids at this time  Mild hypokalemia -Appears to be a chronic issue -Adjustment with hemodialysis per nephrology  ESRD on HD MWF -Plan for hemodialysis in a.m.  Anemia of chronic kidney disease -Currently stable, hold ESA for now -Transfuse for hemoglobin less than 7  Hypertension -Continue home blood pressure medications  Type 2 diabetes -Currently without hyperglycemia -Maintain on SSI every 4 hours -Carb modified diet -Avoid gabapentin for neuropathy given current altered mentation  Hypothyroidism -Continue levothyroxine and check TSH  GERD -PPI  Thrombocytopenia -Hold heparin agents -Monitor CBC  DVT prophylaxis: SCDs Code Status: Full Family Communication: Mother at bedside Disposition Plan: Admit for hemodialysis and  treatment of COVID-pneumonia as well as UTI Consults called: Nephrology Admission status: Inpatient, telemetry  Severity of Illness: The appropriate patient status for this patient is INPATIENT. Inpatient status is judged to be reasonable and necessary in order to provide the required intensity of service to ensure the patient's safety. The patient's presenting symptoms, physical exam findings, and initial radiographic and laboratory data in the context of their chronic comorbidities is felt to place them at high risk for further clinical deterioration. Furthermore, it is not anticipated that the patient will be medically stable for discharge from the hospital within 2 midnights of admission.   * I certify that at the point of admission it is my clinical judgment that the patient will require  inpatient hospital care spanning beyond 2 midnights from the point of admission due to high intensity of service, high risk for further deterioration and high frequency of surveillance required.*   Liddie Chichester D Fahmida Jurich DO [facility]  If 7PM-7AM, please contact night-coverage [email or web address]  03/06/2023, 3:32 PM

## 2023-03-06 NOTE — ED Notes (Signed)
ED TO INPATIENT HANDOFF REPORT  ED Nurse Name and Phone #: Jennette Kettle [phone]  S Name/Age/Gender [name] [name] 62 y.o. male Room/Bed: APA02/APA02  Code Status   Code Status: Prior  Home/SNF/Other Rehab Patient oriented to: self and situation Is this baseline? Yes   Triage Complete: Triage complete  Chief Complaint Acute metabolic encephalopathy [G93.41]  Triage Note Pt BIB EMS for AMS. Pt skipped dialysis last dialysis Wednesday. Per mother pt had something done to his arm so he skipped dialysis Friday. CBG 114, 99.0 t oral.   Allergies No Known Allergies  Level of Care/Admitting Diagnosis ED Disposition     ED Disposition  Admit   Condition  --   Comment  Hospital Area: [facility] [[id number]]  Level of Care: Telemetry [5]  Covid Evaluation: Confirmed COVID Positive  Diagnosis: Acute metabolic encephalopathy [[id number]]  Admitting Physician: Erick Blinks [[id number]]  Attending Physician: Erick Blinks [[id number]]  Certification:: I certify this patient will need inpatient services for at least 2 midnights  Expected Medical Readiness: 03/08/2023          B Medical/Surgery History Past Medical History:  Diagnosis Date   Anemia    Arthritis    Cardiomyopathy (HCC)    July 2022   Diabetes mellitus (HCC)    GERD (gastroesophageal reflux disease)    HTN (hypertension)    Hyperlipidemia    Nephrotic syndrome    Obesity    Pulmonary hypertension (HCC)    July 2022 in setting of nephrotic syndrome   Secondary hyperparathyroidism of renal origin [facility])    Past Surgical History:  Procedure Laterality Date   AV FISTULA PLACEMENT Left 12/27/2022   Procedure: INSERTION OF LEFT ARM ARTERIOVENOUS (AV) GORE-TEX GRAFT;  Surgeon: Chuck Hint, MD;  Location: [facility] OR;  Service: Vascular;  Laterality: Left;   BIOPSY  01/13/2023   Procedure: BIOPSY;  Surgeon: Franky Macho, MD;  Location: [facility] END[facility] SUITE;  Service: Endoscopy;;   CATARACT EXTRACTION,  BILATERAL  2018   COLONOSCOPY WITH PROPOFOL N/A 01/13/2023   Procedure: COLONOSCOPY WITH PROPOFOL;  Surgeon: Franky Macho, MD;  Location: [facility] END[facility] SUITE;  Service: Endoscopy;  Laterality: N/A;   INSERTION OF DIALYSIS CATHETER Right 12/27/2022   Procedure: INSERTION OF Right Internal Jugular  DIALYSIS CATHETER;  Surgeon: Chuck Hint, MD;  Location: [facility] OR;  Service: Vascular;  Laterality: Right;   TONSILLECTOMY       A IV Location/Drains/Wounds Patient Lines/Drains/Airways Status     Active Line/Drains/Airways     Name Placement date Placement time Site Days   Fistula / Graft Left Upper arm Arteriovenous vein graft 12/27/22  1122  Upper arm  69   Hemodialysis Catheter Right Internal jugular Double lumen Permanent (Tunneled) 12/27/22  0948  Internal jugular  69   Wound / Incision (Open or Dehisced) 09/04/20 Puncture Back Left;Lower biopsy site 09/04/20  0946  Back  913            Intake/Output Last 24 hours No intake or output data in the 24 hours ending 03/06/23 1437  Labs/Imaging Results for orders placed or performed during the hospital encounter of 03/06/23 (from the past 48 hour(s))  CBG monitoring, ED     Status: None   Collection Time: 03/06/23  9:56 AM  Result Value Ref Range   Glucose-Capillary 90 70 - 99 mg/dL    Comment: Glucose reference range applies only to samples taken after fasting for at least 8 hours.  I-Stat Chem 8, ED  Status: Abnormal   Collection Time: 03/06/23 10:17 AM  Result Value Ref Range   Sodium 138 135 - 145 mmol/L   Potassium 3.2 (L) 3.5 - 5.1 mmol/L   Chloride 100 98 - 111 mmol/L   BUN 20 8 - 23 mg/dL   Creatinine, Ser 4.09 (H) 0.61 - 1.24 mg/dL   Glucose, Bld 99 70 - 99 mg/dL    Comment: Glucose reference range applies only to samples taken after fasting for at least 8 hours.   Calcium, Ion 0.94 (L) 1.15 - 1.40 mmol/L   TCO2 27 22 - 32 mmol/L   Hemoglobin 14.3 13.0 - 17.0 g/dL   HCT 81.1 91.4 - 78.2 %  CBC with  Differential     Status: Abnormal   Collection Time: 03/06/23 10:26 AM  Result Value Ref Range   WBC 5.3 4.0 - 10.5 K/uL   RBC 4.68 4.22 - 5.81 MIL/uL   Hemoglobin 13.4 13.0 - 17.0 g/dL   HCT 95.6 21.3 - 08.6 %   MCV 89.5 80.0 - 100.0 fL   MCH 28.6 26.0 - 34.0 pg   MCHC 32.0 30.0 - 36.0 g/dL   RDW 57.8 46.9 - 62.9 %   Platelets 99 (L) 150 - 400 K/uL    Comment: SPECIMEN CHECKED FOR CLOTS PLATELET COUNT CONFIRMED BY SMEAR    nRBC 0.0 0.0 - 0.2 %   Neutrophils Relative % 74 %   Neutro Abs 4.0 1.7 - 7.7 K/uL   Lymphocytes Relative 21 %   Lymphs Abs 1.1 0.7 - 4.0 K/uL   Monocytes Relative 3 %   Monocytes Absolute 0.1 0.1 - 1.0 K/uL   Eosinophils Relative 0 %   Eosinophils Absolute 0.0 0.0 - 0.5 K/uL   Basophils Relative 1 %   Basophils Absolute 0.0 0.0 - 0.1 K/uL   WBC Morphology MORPHOLOGY UNREMARKABLE    RBC Morphology POLYCHROMASIA    Smear Review Reviewed    Immature Granulocytes 1 %   Abs Immature Granulocytes 0.03 0.00 - 0.07 K/uL   Polychromasia PRESENT     Comment: Performed at [facility], [address], [place], [place] [address]  Comprehensive metabolic panel     Status: Abnormal   Collection Time: 03/06/23 10:26 AM  Result Value Ref Range   Sodium 137 135 - 145 mmol/L   Potassium 3.0 (L) 3.5 - 5.1 mmol/L   Chloride 96 (L) 98 - 111 mmol/L   CO2 27 22 - 32 mmol/L   Glucose, Bld 99 70 - 99 mg/dL    Comment: Glucose reference range applies only to samples taken after fasting for at least 8 hours.   BUN 18 8 - 23 mg/dL   Creatinine, Ser 3.24 (H) 0.61 - 1.24 mg/dL   Calcium 7.9 (L) 8.9 - 10.3 mg/dL   Total Protein 6.5 6.5 - 8.1 g/dL   Albumin 2.6 (L) 3.5 - 5.0 g/dL   AST 21 15 - 41 U/L   ALT 17 0 - 44 U/L   Alkaline Phosphatase 154 (H) 38 - 126 U/L   Total Bilirubin 1.6 (H) 0.3 - 1.2 mg/dL   GFR, Estimated 10 (L) >60 mL/min    Comment: (NOTE) Calculated using the CKD-EPI Creatinine Equation (2021)    Anion gap 14 5 - 15    Comment: Performed at [facility], [address] [place], [place] [address]  Ammonia     Status: None   Collection Time: 03/06/23 10:26 AM  Result Value Ref Range  Ammonia 15 9 - 35 umol/L    Comment: Performed at [facility], [address] [place], [place] [address]  Lactic acid, plasma     Status: None   Collection Time: 03/06/23 10:26 AM  Result Value Ref Range   Lactic Acid, Venous 1.2 0.5 - 1.9 mmol/L    Comment: Performed at [facility], [address], [place], [place] [address]  Protime-INR     Status: None   Collection Time: 03/06/23 10:26 AM  Result Value Ref Range   Prothrombin Time 14.4 11.4 - 15.2 seconds   INR 1.1 0.8 - 1.2    Comment: (NOTE) INR goal varies based on device and disease states. Performed at [facility], [address], [place], [place] [address]   Ethanol     Status: None   Collection Time: 03/06/23 10:26 AM  Result Value Ref Range   Alcohol, Ethyl (B) <10 <10 mg/dL    Comment: (NOTE) Lowest detectable limit for serum alcohol is 10 mg/dL.  For medical purposes only. Performed at [facility], [address], [place], [place] [address]   Blood gas, venous     Status: Abnormal   Collection Time: 03/06/23 10:26 AM  Result Value Ref Range   pH, Ven 7.49 (H) 7.25 - 7.43   pCO2, Ven 44 44 - 60 mmHg   pO2, Ven 31 (LL) 32 - 45 mmHg    Comment: CRITICAL RESULT CALLED TO, READ BACK BY AND VERIFIED WITH:  Arlys Scatena,D @ 10:35 ON 03/06/23 BY PURDIE, J    Bicarbonate 32.8 (H) 20.0 - 28.0 mmol/L   Acid-Base Excess 8.5 (H) 0.0 - 2.0 mmol/L   O2 Saturation 49.3 %   Patient temperature 37.3    Collection site BLOOD RIGHT FOREARM    Drawn by [id number]     Comment: Performed at [facility], [address], [place], [place] [address]  Urinalysis, Routine w reflex microscopic -Urine, Clean Catch     Status: Abnormal   Collection Time: 03/06/23 10:36 AM  Result Value Ref Range   Color, Urine AMBER (A) YELLOW    Comment: BIOCHEMICALS MAY BE AFFECTED BY COLOR   APPearance CLOUDY (A) CLEAR    Specific Gravity, Urine 1.019 1.005 - 1.030   pH 5.0 5.0 - 8.0   Glucose, UA NEGATIVE NEGATIVE mg/dL   Hgb urine dipstick LARGE (A) NEGATIVE   Bilirubin Urine NEGATIVE NEGATIVE   Ketones, ur NEGATIVE NEGATIVE mg/dL   Protein, ur >=956 (A) NEGATIVE mg/dL   Nitrite NEGATIVE NEGATIVE   Leukocytes,Ua MODERATE (A) NEGATIVE   RBC / HPF >50 0 - 5 RBC/hpf   WBC, UA >50 0 - 5 WBC/hpf   Bacteria, UA NONE SEEN NONE SEEN   Squamous Epithelial / HPF 0-5 0 - 5 /HPF   Mucus PRESENT     Comment: Performed at [facility], [address] [place], [place] [address]  SARS Coronavirus 2 by RT PCR (hospital order, performed in [facility] hospital lab) *cepheid single result test* Anterior Nasal Swab     Status: Abnormal   Collection Time: 03/06/23 10:36 AM   Specimen: Anterior Nasal Swab  Result Value Ref Range   SARS Coronavirus 2 by RT PCR POSITIVE (A) NEGATIVE    Comment: (NOTE) SARS-CoV-2 target nucleic acids are DETECTED  SARS-CoV-2 RNA is generally detectable in upper respiratory specimens  during the acute phase of infection.  Positive results are indicative  of the presence of the identified virus, but do not rule out bacterial infection  or co-infection with other pathogens not detected by the test.  Clinical correlation with patient history and  other diagnostic information is necessary to determine patient infection status.  The expected result is negative.  Fact Sheet for Patients:   RoadLapTop.co.za   Fact Sheet for Healthcare Providers:   http://kim-miller.com/    This test is not yet approved or cleared by the [place] FDA and  has been authorized for detection and/or diagnosis of SARS-CoV-2 by FDA under an Emergency Use Authorization (EUA).  This EUA will remain in effect (meaning this test can be used) for the duration of  the COVID-19 declaration under Section 564(b)(1)  of the Act, 21 U.S.C. section 360-bbb-3(b)(1), unless the  authorization is terminated or revoked sooner.   Performed at [facility], [address], [place], [place] [address]    CT CHEST ABDOMEN PELVIS WO CONTRAST  Result Date: 03/06/2023 CLINICAL DATA:  Altered mental status, possible sepsis. EXAM: CT CHEST, ABDOMEN AND PELVIS WITHOUT CONTRAST TECHNIQUE: Multidetector CT imaging of the chest, abdomen and pelvis was performed following the standard protocol without IV contrast. RADIATION DOSE REDUCTION: This exam was performed according to the departmental dose-optimization program which includes automated exposure control, adjustment of the mA and/or kV according to patient size and/or use of iterative reconstruction technique. COMPARISON:  January 11, 2023. FINDINGS: CT CHEST FINDINGS Cardiovascular: Atherosclerosis of thoracic aorta is noted without aneurysm formation. Normal cardiac size. No pericardial effusion. Mediastinum/Nodes: No enlarged mediastinal, hilar, or axillary lymph nodes. Thyroid gland, trachea, and esophagus demonstrate no significant findings. Lungs/Pleura: Small moderate size bilateral pleural effusions are noted with adjacent subsegmental atelectasis. No pneumothorax. Musculoskeletal: No chest wall mass or suspicious bone lesions identified. CT ABDOMEN PELVIS FINDINGS Hepatobiliary: No cholelithiasis or biliary dilatation. 11 mm left hepatic low density is again noted. Pancreas: Fatty replacement of the pancreas is noted. Cystic areas seen in pancreatic tail on prior exam are not well visualized currently. Spleen: Normal in size without focal abnormality. Adrenals/Urinary Tract: Adrenal glands and kidneys appear normal. No hydronephrosis or renal obstruction is noted. Urinary bladder is decompressed. Stomach/Bowel: Stomach is within normal limits. Appendix appears normal. No evidence of bowel wall thickening, distention, or inflammatory changes. Vascular/Lymphatic: Aortic atherosclerosis. No enlarged abdominal or pelvic lymph nodes.  Reproductive: Prostate is unremarkable. Other: No abdominal wall hernia or abnormality. No abdominopelvic ascites. Musculoskeletal: No acute or significant osseous findings. IMPRESSION: Small to moderate size bilateral pleural effusions are noted with minimal adjacent subsegmental atelectasis. 11 mm left hepatic low density is noted as described on prior CT scan. This may simply represent cyst or hemangioma, but other pathology cannot be excluded. When the patient is clinically stable and able to follow directions and hold their breath (preferably as an outpatient) further evaluation with dedicated abdominal MRI should be considered. Fatty replacement of the pancreas. Cystic areas seen in pancreatic tail on prior exam are not well visualized currently. Aortic Atherosclerosis (ICD10-I70.0). Electronically Signed   By: Lupita Raider M.D.   On: 03/06/2023 14:21   CT Head Wo Contrast  Result Date: 03/06/2023 CLINICAL DATA:  Mental status change, unknown cause EXAM: CT HEAD WITHOUT CONTRAST TECHNIQUE: Contiguous axial images were obtained from the base of the skull through the vertex without intravenous contrast. RADIATION DOSE REDUCTION: This exam was performed according to the departmental dose-optimization program which includes automated exposure control, adjustment of the mA and/or kV according to patient size and/or use of iterative reconstruction technique. COMPARISON:  None Available. FINDINGS: Brain: There is atrophy  and chronic small vessel disease changes. Associated mild ventriculomegaly related to ex vacuo dilatation. No acute intracranial abnormality. Specifically, no hemorrhage, hydrocephalus, mass lesion, acute infarction, or significant intracranial injury. Vascular: No hyperdense vessel or unexpected calcification. Skull: No acute calvarial abnormality. Sinuses/Orbits: No acute findings Other: No IMPRESSION: Atrophy, chronic microvascular disease. No acute intracranial abnormality. Electronically  Signed   By: Charlett Nose M.D.   On: 03/06/2023 13:29   DG Chest Port 1 View  Result Date: 03/06/2023 CLINICAL DATA:  Altered mental status EXAM: PORTABLE CHEST - 1 VIEW COMPARISON:  12/27/2022 FINDINGS: Cardiomediastinal silhouette and pulmonary vasculature are within normal limits. Interval removal of tunneled right IJ hemodialysis catheter. Mild patchy opacities at the lung bases are new since prior study. IMPRESSION: New mild patchy lung base opacities could be related to atelectasis or pneumonia. Electronically Signed   By: Acquanetta Belling M.D.   On: 03/06/2023 11:41    Pending Labs Unresulted Labs (From admission, onward)     Start     Ordered   03/06/23 1437  Procalcitonin  Once,   R       References:    Procalcitonin Lower Respiratory Tract Infection AND Sepsis Procalcitonin Algorithm   03/06/23 1436   03/06/23 1430  Hepatitis B surface antigen  (New Admission Hemo Labs (Hepatitis B))  Once,   URGENT        03/06/23 1431   03/06/23 1430  Hepatitis B surface antibody,quantitative  (New Admission Hemo Labs (Hepatitis B))  Once,   URGENT        03/06/23 1431   03/06/23 1044  Culture, blood (routine x 2)  BLOOD CULTURE X 2,   R (with STAT occurrences)      03/06/23 1043   Signed and Held  Renal function panel  Once,   R        Signed and Held   Signed and Held  CBC  Once,   R        Signed and Held            Vitals/Pain Today's Vitals   03/06/23 1200 03/06/23 1345 03/06/23 1400 03/06/23 1415  BP: (!) 159/67 (!) 150/64 (!) 152/67 (!) 157/63  Pulse: 71 64 64 65  Resp: 20     Temp: (!) 101.1 F (38.4 C)     TempSrc: Rectal     SpO2: 94% 95% 95% 94%  Weight:      Height:      PainSc:        Isolation Precautions Airborne and Contact precautions  Medications Medications  Chlorhexidine Gluconate Cloth 2 % PADS 6 each (has no administration in time range)  acetaminophen (TYLENOL) suppository 650 mg (650 mg Rectal Given 03/06/23 1100)    Mobility non-ambulatory      Focused Assessments Renal Assessment Handoff:  Hemodialysis Schedule: Hemodialysis Schedule: Monday/Wednesday/Friday Last Hemodialysis date and time: Wednesday 03/01/2023   Restricted appendage: left arm   R Recommendations: See Admitting Provider Note  Report given to:   Additional Notes: COVID +, pneumonia

## 2023-03-06 NOTE — ED Triage Notes (Signed)
Pt BIB EMS for AMS. Pt skipped dialysis last dialysis Wednesday. Per mother pt had something done to his arm so he skipped dialysis Friday. CBG 114, 99.0 t oral.

## 2023-03-06 NOTE — Consult Note (Addendum)
ESRD Consult Note (virtual)  Reason for consult: ESRD, provision of dialysis  Assessment/Recommendations:  ESRD -outpatient HD orders: [facility]. MWF.  4 hours.  EDW 64 kg.  LUE AVG 15-gauge.  Flow rates: 400/autoflow 1.5.  3K/2.5 calcium.  No heparin.  Meds: Mircera 75 mcg every 2 weeks (last dose 9/11).  Venofer 50 mg q. weekly. -HD on MWF schedule typically, will give his AVF a rest, plan for HD tomorrow and then back on MWF schedule thereafter -If issues with AVF, then we will need to arrange for a dialysis catheter  COVID pneumonia -Per primary service  Altered mental status -Possibly related to sepsis, will see if he improves with dialysis after tomorrow  Volume/ hypertension  -UF as tolerated  Anemia of Chronic Kidney Disease -Hemoglobin 13.4.  Echo for ESRD.  Will hold ESA for now -Transfuse PRN for Hgb <7  Secondary Hyperparathyroidism/Hyperphosphatemia -Resume home binders if on any.  Check phosphorus  Hypokalemia -Chronic -3K bath  Thrombocytopenia -Workup/management per primary service   Discussed with patient's mom over the phone.  Anthony Sar, MD [facility]  History of Present Illness: [name] [name] is a/an 62 y.o. male with a past medical history of ESRD, hypertension, DM 2, pulmonary hypertension, cardiomyopathy, GERD, hyperlipidemia who presents with altered mental status.  Last dialysis was on Wednesday.  Found to have COVID here.  He is found to have the word no written over his aVF, unsure why. CT head with atrophy, chronic microvascular disease. Chest x-ray with new mild patchy lung base opacities that could be related to atelectasis or pneumonia. Discussed with patient's mom over the phone.  Apparently, he had what sounded like an infiltration of his fistula on Friday therefore was not able to run.  She had taken him to vascular surgery (sounds like access center the way she is explaining it) who had written no on his fistula.  He was  supposed to have a make-up treatment on Saturday however was too ill to go for dialysis.  There was no talk of putting in a catheter while we let his arm rest as per patient's mom. Discussed with his outpatient HD unit. Apparently there was some difficulty with cannulation but no other concerns.   PATIENT NOT PHYSICALLY SEEN GIVEN COVID STATUS AND IN EFFORTS TO PRESERVE PPE.   Medications:  No current facility-administered medications for this encounter.   Current Outpatient Medications  Medication Sig Dispense Refill   Accu-Chek Softclix Lancets lancets      acetaminophen (TYLENOL) 500 MG tablet Take 500 mg by mouth every 6 (six) hours as needed (arthritis).      amitriptyline (ELAVIL) 10 MG tablet Take 10 mg by mouth at bedtime.     amLODipine (NORVASC) 5 MG tablet Take 5 mg by mouth every morning.     atorvastatin (LIPITOR) 20 MG tablet Take 20 mg by mouth every morning.     Blood Glucose Monitoring Suppl (ACCU-CHEK GUIDE) w/Device KIT in the morning.     docusate sodium (COLACE) 100 MG capsule Take 1 capsule (100 mg total) by mouth 2 (two) times daily. 60 capsule 2   famotidine (PEPCID) 20 MG tablet Take 1 tablet (20 mg total) by mouth at bedtime.     gabapentin (NEURONTIN) 100 MG capsule Take 100 mg by mouth at bedtime.     hydrALAZINE (APRESOLINE) 25 MG tablet Take 1 tablet (25 mg total) by mouth every 8 (eight) hours. (Patient not taking: Reported on 02/28/2023) 90 tablet 3   levothyroxine (  SYNTHROID) 50 MCG tablet Take 50 mcg by mouth daily before breakfast.     metoprolol tartrate (LOPRESSOR) 50 MG tablet Take 1 tablet (50 mg total) by mouth 2 (two) times daily. 60 tablet 0   pantoprazole (PROTONIX) 40 MG tablet Take 1 tablet (40 mg total) by mouth daily. 30 tablet 1   polyethylene glycol (MIRALAX / GLYCOLAX) 17 g packet Take 17 g by mouth 2 (two) times daily. 180 packet 0   vitamin B-12 (CYANOCOBALAMIN) 1000 MCG tablet Take 1,000 mcg by mouth at bedtime.       ALLERGIES Patient  has no known allergies.  MEDICAL HISTORY Past Medical History:  Diagnosis Date   Anemia    Arthritis    Cardiomyopathy [facility])    July 2022   Diabetes mellitus (HCC)    GERD (gastroesophageal reflux disease)    HTN (hypertension)    Hyperlipidemia    Nephrotic syndrome    Obesity    Pulmonary hypertension (HCC)    July 2022 in setting of nephrotic syndrome   Secondary hyperparathyroidism of renal origin [facility])      SOCIAL HISTORY Social History   Socioeconomic History   Marital status: Single    Spouse name: Not on file   Number of children: Not on file   Years of education: Not on file   Highest education level: Not on file  Occupational History   Not on file  Tobacco Use   Smoking status: Never   Smokeless tobacco: Never  Vaping Use   Vaping status: Never Used  Substance and Sexual Activity   Alcohol use: Never   Drug use: Never   Sexual activity: Not on file  Other Topics Concern   Not on file  Social History Narrative   Not on file   Social Determinants of Health   Financial Resource Strain: Not on file  Food Insecurity: No Food Insecurity (01/12/2023)   Hunger Vital Sign    Worried About Running Out of Food in the Last Year: Never true    Ran Out of Food in the Last Year: Never true  Transportation Needs: No Transportation Needs (01/12/2023)   PRAPARE - Administrator, Civil Service (Medical): No    Lack of Transportation (Non-Medical): No  Physical Activity: Not on file  Stress: Not on file  Social Connections: Not on file  Intimate Partner Violence: Not At Risk (01/12/2023)   Humiliation, Afraid, Rape, and Kick questionnaire    Fear of Current or Ex-Partner: No    Emotionally Abused: No    Physically Abused: No    Sexually Abused: No     FAMILY HISTORY Family History  Problem Relation Age of Onset   Hypertension Mother    Diabetes Mother    Cancer Father        prostate cancer   Hypertension Maternal Grandfather    Rheum arthritis  Maternal Grandmother    CAD Neg Hx      Review of Systems: 12 systems were reviewed and negative except per HPI  Physical Exam: Vitals:   03/06/23 1145 03/06/23 1200  BP: (!) 163/64 (!) 159/67  Pulse: 71 71  Resp: 20 20  Temp:  (!) 101.1 F (38.4 C)  SpO2: 95% 94%   No intake/output data recorded. No intake or output data in the 24 hours ending 03/06/23 1313  PATIENT NOT PHYSICALLY SEEN GIVEN COVID STATUS.  Test Results Reviewed Lab Results  Component Value Date   NA 137 03/06/2023  K 3.0 (L) 03/06/2023   CL 96 (L) 03/06/2023   CO2 27 03/06/2023   BUN 18 03/06/2023   CREATININE 5.98 (H) 03/06/2023   CALCIUM 7.9 (L) 03/06/2023   ALBUMIN 2.6 (L) 03/06/2023   PHOS 3.7 01/13/2023    I have reviewed relevant outside healthcare records

## 2023-03-06 NOTE — ED Provider Notes (Signed)
[place] EMERGENCY DEPARTMENT AT [facility] Provider Note   CSN: [id number] Arrival date & time: 03/06/23  0940     History  No chief complaint on file.   [name] [name] is a 62 y.o. male.  Level 5 caveat secondary to altered mental status.  Patient brought in by EMS from home.  History of end-stage renal disease Monday Wednesday Friday last dialysis was last Wednesday.  Unclear why he has not gone since then although he has "no" written with marker over his left upper arm fistula.  Patient denies any complaints and notes he knows he is at [facility] but that is all that I can get out of him.  Unclear when this started.  Was placed on oxygen by EMS.  Recent admission for lower GI bleed.  The history is provided by the patient and the EMS personnel.  Altered Mental Status Presenting symptoms: lethargy   Progression:  Unchanged      Home Medications Prior to Admission medications   Medication Sig Start Date End Date Taking? Authorizing Provider  Accu-Chek Softclix Lancets lancets  08/06/20   [provider]  acetaminophen (TYLENOL) 500 MG tablet Take 500 mg by mouth every 6 (six) hours as needed (arthritis).     [provider]  amitriptyline (ELAVIL) 10 MG tablet Take 10 mg by mouth at bedtime. 05/24/22   [provider]  amLODipine (NORVASC) 5 MG tablet Take 5 mg by mouth every morning.    [provider]  atorvastatin (LIPITOR) 20 MG tablet Take 20 mg by mouth every morning. 09/23/20   [provider]  Blood Glucose Monitoring Suppl (ACCU-CHEK GUIDE) w/Device KIT in the morning. 08/06/20   [provider]  docusate sodium (COLACE) 100 MG capsule Take 1 capsule (100 mg total) by mouth 2 (two) times daily. 01/14/23 01/14/24  Vassie Loll, MD  famotidine (PEPCID) 20 MG tablet Take 1 tablet (20 mg total) by mouth at bedtime. 01/14/23   Vassie Loll, MD  gabapentin (NEURONTIN) 100 MG capsule Take 100 mg by mouth at bedtime.     [provider]  hydrALAZINE (APRESOLINE) 25 MG tablet Take 1 tablet (25 mg total) by mouth every 8 (eight) hours. Patient not taking: Reported on 02/28/2023 09/10/20   Lewie Chamber, MD  levothyroxine (SYNTHROID) 50 MCG tablet Take 50 mcg by mouth daily before breakfast. 02/18/21   [provider]  metoprolol tartrate (LOPRESSOR) 50 MG tablet Take 1 tablet (50 mg total) by mouth 2 (two) times daily. 06/16/20   Jacquelin Hawking, PA-C  pantoprazole (PROTONIX) 40 MG tablet Take 1 tablet (40 mg total) by mouth daily. 01/14/23 01/14/24  Vassie Loll, MD  polyethylene glycol (MIRALAX / GLYCOLAX) 17 g packet Take 17 g by mouth 2 (two) times daily. 02/28/23 05/29/23  Franky Macho, MD  vitamin B-12 (CYANOCOBALAMIN) 1000 MCG tablet Take 1,000 mcg by mouth at bedtime.    [provider]      Allergies    Patient has no known allergies.    Review of Systems   Review of Systems  Unable to perform ROS: Mental status change    Physical Exam Updated Vital Signs BP (!) 161/59   Pulse 62   Temp 98.7 F (37.1 C) (Oral)   Resp 16   Ht 5\' 10"  (1.778 m)   Wt 78 kg   SpO2 97%   BMI 24.68 kg/m  Physical Exam Vitals and nursing note reviewed.  Constitutional:  General: He is awake. He is not in acute distress.    Appearance: Normal appearance. He is well-developed.  HENT:     Head: Normocephalic and atraumatic.  Eyes:     Conjunctiva/sclera: Conjunctivae normal.  Cardiovascular:     Rate and Rhythm: Normal rate and regular rhythm.     Heart sounds: No murmur heard. Pulmonary:     Effort: Pulmonary effort is normal. No respiratory distress.     Breath sounds: Normal breath sounds.  Abdominal:     Palpations: Abdomen is soft.     Tenderness: There is no abdominal tenderness. There is no guarding or rebound.  Musculoskeletal:        General: No swelling.     Cervical back: Neck supple.     Comments: Patient has some bruising and has a fistula in his left upper arm.   There is a positive thrill.  Skin:    General: Skin is warm and dry.     Capillary Refill: Capillary refill takes less than 2 seconds.  Neurological:     General: No focal deficit present.     Mental Status: He is disoriented.     Motor: No weakness.     ED Results / Procedures / Treatments   Labs (all labs ordered are listed, but only abnormal results are displayed) Labs Reviewed  SARS CORONAVIRUS 2 BY RT PCR - Abnormal; Notable for the following components:      Result Value   SARS Coronavirus 2 by RT PCR POSITIVE (*)    All other components within normal limits  CBC WITH DIFFERENTIAL/PLATELET - Abnormal; Notable for the following components:   Platelets 99 (*)    All other components within normal limits  COMPREHENSIVE METABOLIC PANEL - Abnormal; Notable for the following components:   Potassium 3.0 (*)    Chloride 96 (*)    Creatinine, Ser 5.98 (*)    Calcium 7.9 (*)    Albumin 2.6 (*)    Alkaline Phosphatase 154 (*)    Total Bilirubin 1.6 (*)    GFR, Estimated 10 (*)    All other components within normal limits  URINALYSIS, ROUTINE W REFLEX MICROSCOPIC - Abnormal; Notable for the following components:   Color, Urine AMBER (*)    APPearance CLOUDY (*)    Hgb urine dipstick LARGE (*)    Protein, ur >=300 (*)    Leukocytes,Ua MODERATE (*)    All other components within normal limits  BLOOD GAS, VENOUS - Abnormal; Notable for the following components:   pH, Ven 7.49 (*)    pO2, Ven 31 (*)    Bicarbonate 32.8 (*)    Acid-Base Excess 8.5 (*)    All other components within normal limits  I-STAT CHEM 8, ED - Abnormal; Notable for the following components:   Potassium 3.2 (*)    Creatinine, Ser 6.90 (*)    Calcium, Ion 0.94 (*)    All other components within normal limits  CULTURE, BLOOD (ROUTINE X 2)  CULTURE, BLOOD (ROUTINE X 2)  AMMONIA  LACTIC ACID, PLASMA  PROTIME-INR  ETHANOL  PROCALCITONIN  GLUCOSE, CAPILLARY  HEPATITIS B SURFACE ANTIBODY, QUANTITATIVE   HEPATITIS B SURFACE ANTIGEN  MAGNESIUM  BASIC METABOLIC PANEL  CBC  CBG MONITORING, ED    EKG EKG Interpretation Date/Time:  Monday March 06 2023 11:02:12 EDT Ventricular Rate:  71 PR Interval:  68 QRS Duration:  104 QT Interval:  459 QTC Calculation: 499 R Axis:   29  Text Interpretation:  Sinus rhythm Short PR interval Borderline ST depression, diffuse leads Borderline prolonged QT interval No significant change since prior 9/24 Confirmed by Meridee Score [id number]) on 03/06/2023 11:10:56 AM  Radiology CT CHEST ABDOMEN PELVIS WO CONTRAST  Result Date: 03/06/2023 CLINICAL DATA:  Altered mental status, possible sepsis. EXAM: CT CHEST, ABDOMEN AND PELVIS WITHOUT CONTRAST TECHNIQUE: Multidetector CT imaging of the chest, abdomen and pelvis was performed following the standard protocol without IV contrast. RADIATION DOSE REDUCTION: This exam was performed according to the departmental dose-optimization program which includes automated exposure control, adjustment of the mA and/or kV according to patient size and/or use of iterative reconstruction technique. COMPARISON:  January 11, 2023. FINDINGS: CT CHEST FINDINGS Cardiovascular: Atherosclerosis of thoracic aorta is noted without aneurysm formation. Normal cardiac size. No pericardial effusion. Mediastinum/Nodes: No enlarged mediastinal, hilar, or axillary lymph nodes. Thyroid gland, trachea, and esophagus demonstrate no significant findings. Lungs/Pleura: Small moderate size bilateral pleural effusions are noted with adjacent subsegmental atelectasis. No pneumothorax. Musculoskeletal: No chest wall mass or suspicious bone lesions identified. CT ABDOMEN PELVIS FINDINGS Hepatobiliary: No cholelithiasis or biliary dilatation. 11 mm left hepatic low density is again noted. Pancreas: Fatty replacement of the pancreas is noted. Cystic areas seen in pancreatic tail on prior exam are not well visualized currently. Spleen: Normal in size without focal  abnormality. Adrenals/Urinary Tract: Adrenal glands and kidneys appear normal. No hydronephrosis or renal obstruction is noted. Urinary bladder is decompressed. Stomach/Bowel: Stomach is within normal limits. Appendix appears normal. No evidence of bowel wall thickening, distention, or inflammatory changes. Vascular/Lymphatic: Aortic atherosclerosis. No enlarged abdominal or pelvic lymph nodes. Reproductive: Prostate is unremarkable. Other: No abdominal wall hernia or abnormality. No abdominopelvic ascites. Musculoskeletal: No acute or significant osseous findings. IMPRESSION: Small to moderate size bilateral pleural effusions are noted with minimal adjacent subsegmental atelectasis. 11 mm left hepatic low density is noted as described on prior CT scan. This may simply represent cyst or hemangioma, but other pathology cannot be excluded. When the patient is clinically stable and able to follow directions and hold their breath (preferably as an outpatient) further evaluation with dedicated abdominal MRI should be considered. Fatty replacement of the pancreas. Cystic areas seen in pancreatic tail on prior exam are not well visualized currently. Aortic Atherosclerosis (ICD10-I70.0). Electronically Signed   By: Lupita Raider M.D.   On: 03/06/2023 14:21   CT Head Wo Contrast  Result Date: 03/06/2023 CLINICAL DATA:  Mental status change, unknown cause EXAM: CT HEAD WITHOUT CONTRAST TECHNIQUE: Contiguous axial images were obtained from the base of the skull through the vertex without intravenous contrast. RADIATION DOSE REDUCTION: This exam was performed according to the departmental dose-optimization program which includes automated exposure control, adjustment of the mA and/or kV according to patient size and/or use of iterative reconstruction technique. COMPARISON:  None Available. FINDINGS: Brain: There is atrophy and chronic small vessel disease changes. Associated mild ventriculomegaly related to ex vacuo  dilatation. No acute intracranial abnormality. Specifically, no hemorrhage, hydrocephalus, mass lesion, acute infarction, or significant intracranial injury. Vascular: No hyperdense vessel or unexpected calcification. Skull: No acute calvarial abnormality. Sinuses/Orbits: No acute findings Other: No IMPRESSION: Atrophy, chronic microvascular disease. No acute intracranial abnormality. Electronically Signed   By: Charlett Nose M.D.   On: 03/06/2023 13:29   DG Chest Port 1 View  Result Date: 03/06/2023 CLINICAL DATA:  Altered mental status EXAM: PORTABLE CHEST - 1 VIEW COMPARISON:  12/27/2022 FINDINGS: Cardiomediastinal silhouette and pulmonary vasculature are within normal limits. Interval removal of tunneled right  IJ hemodialysis catheter. Mild patchy opacities at the lung bases are new since prior study. IMPRESSION: New mild patchy lung base opacities could be related to atelectasis or pneumonia. Electronically Signed   By: Acquanetta Belling M.D.   On: 03/06/2023 11:41    Procedures Procedures    Medications Ordered in ED Medications  Chlorhexidine Gluconate Cloth 2 % PADS 6 each (has no administration in time range)  cefTRIAXone (ROCEPHIN) 1 g in sodium chloride 0.9 % 100 mL IVPB (1 g Intravenous New Bag/Given 03/06/23 1535)  amLODipine (NORVASC) tablet 5 mg (has no administration in time range)  atorvastatin (LIPITOR) tablet 20 mg (has no administration in time range)  metoprolol tartrate (LOPRESSOR) tablet 50 mg (has no administration in time range)  levothyroxine (SYNTHROID) tablet 50 mcg (has no administration in time range)  pantoprazole (PROTONIX) EC tablet 40 mg (has no administration in time range)  sodium chloride flush (NS) 0.9 % injection 3 mL (has no administration in time range)  sodium chloride flush (NS) 0.9 % injection 3 mL (has no administration in time range)  0.9 %  sodium chloride infusion (has no administration in time range)  acetaminophen (TYLENOL) tablet 650 mg (has no  administration in time range)    Or  acetaminophen (TYLENOL) suppository 650 mg (has no administration in time range)  ondansetron (ZOFRAN) tablet 4 mg (has no administration in time range)    Or  ondansetron (ZOFRAN) injection 4 mg (has no administration in time range)  multivitamin with minerals tablet 1 tablet (has no administration in time range)  insulin aspart (novoLOG) injection 0-9 Units ( Subcutaneous Not Given 03/06/23 1649)  insulin aspart (novoLOG) injection 0-5 Units (has no administration in time range)  remdesivir 100 mg in sodium chloride 0.9 % 100 mL IVPB (has no administration in time range)    Followed by  remdesivir 100 mg in sodium chloride 0.9 % 100 mL IVPB (has no administration in time range)  acetaminophen (TYLENOL) suppository 650 mg (650 mg Rectal Given 03/06/23 1100)    ED Course/ Medical Decision Making/ A&P Clinical Course as of 03/06/23 1701  Mon Mar 06, 2023  1009 Chest x-ray does not show any acute infiltrate.  Awaiting radiology reading. [MB]  1329 Discussed with Dr. Thedore Mins nephrology.  He said he would work on trying to figure out why we could not use the access and get him dialysis today.  Mother is here and states they had a problem with his access on Wednesday and since then his arm is hurt.  She saw somebody at [facility] who circled an area and said do not poke him here and it needs to heal. [MB]  1338 Discussed with Dr. Sherryll Burger [facility] who wants to wait until the CT abdomen and pelvis results to see if that would change his ultimate disposition. [MB]    Clinical Course User Index [MB] Terrilee Files, MD                                 Medical Decision Making Amount and/or Complexity of Data Reviewed Labs: ordered. Radiology: ordered.  Risk OTC drugs. Decision regarding hospitalization.   This patient complains of lethargy possible altered mental status left arm pain; this involves an extensive number of treatment Options and is a  complaint that carries with it a high risk of complications and morbidity. The differential includes cellulitis, DVT, vascular injury, pneumonia, COVID, flu, metabolic  derangement, sepsis  I ordered, reviewed and interpreted labs, which included CBC with normal white count, new low platelets, chemistries with low potassium chronic CKD, lactate normal, blood culture sent, COVID-positive, VBG without acidosis I ordered medication Tylenol suppository and reviewed PMP when indicated. I ordered imaging studies which included chest x-ray head CT CT chest abdomen pelvis and I independently    visualized and interpreted imaging which showed no acute findings to explain patient's illness Additional history obtained from patient's mother Previous records obtained and reviewed in epic including recent discharge summary I consulted Dr. Thedore Mins nephrology and Dr. Sherryll Burger [facility] hospital[facility] and discussed lab and imaging findings and discussed disposition.  Cardiac monitoring reviewed, sinus rhythm Social determinants considered, no significant barriers Critical Interventions: None  After the interventions stated above, I reevaluated the patient and found patient still to be altered Admission and further testing considered, he would benefit from mission to the hospital for further workup including likely will need dialysis and unclear of the availability of his access.         Final Clinical Impression(s) / ED Diagnoses Final diagnoses:  Acute metabolic encephalopathy  COVID-19 virus infection  Lower urinary tract infection    Rx / DC Orders ED Discharge Orders     None         Terrilee Files, MD 03/06/23 1706

## 2023-03-07 DIAGNOSIS — G9341 Metabolic encephalopathy: Secondary | ICD-10-CM | POA: Diagnosis not present

## 2023-03-07 LAB — BASIC METABOLIC PANEL
Anion gap: 11 (ref 5–15)
BUN: 25 mg/dL — ABNORMAL HIGH (ref 8–23)
CO2: 26 mmol/L (ref 22–32)
Calcium: 7.2 mg/dL — ABNORMAL LOW (ref 8.9–10.3)
Chloride: 100 mmol/L (ref 98–111)
Creatinine, Ser: 6.81 mg/dL — ABNORMAL HIGH (ref 0.61–1.24)
GFR, Estimated: 8 mL/min — ABNORMAL LOW (ref >60)
Glucose, Bld: 111 mg/dL — ABNORMAL HIGH (ref 70–99)
Potassium: 2.8 mmol/L — ABNORMAL LOW (ref 3.5–5.1)
Sodium: 137 mmol/L (ref 135–145)

## 2023-03-07 LAB — CBC
HCT: 34.1 % — ABNORMAL LOW (ref 39.0–52.0)
Hemoglobin: 10.4 g/dL — ABNORMAL LOW (ref 13.0–17.0)
MCH: 27.7 pg (ref 26.0–34.0)
MCHC: 30.5 g/dL (ref 30.0–36.0)
MCV: 90.7 fL (ref 80.0–100.0)
Platelets: 76 10*3/uL — ABNORMAL LOW (ref 150–400)
RBC: 3.76 MIL/uL — ABNORMAL LOW (ref 4.22–5.81)
RDW: 14.6 % (ref 11.5–15.5)
WBC: 3.3 10*3/uL — ABNORMAL LOW (ref 4.0–10.5)
nRBC: 0 % (ref 0.0–0.2)

## 2023-03-07 LAB — BLOOD CULTURE ID PANEL (REFLEXED) - BCID2
A.calcoaceticus-baumannii: NOT DETECTED
Bacteroides fragilis: NOT DETECTED
Candida albicans: NOT DETECTED
Candida auris: NOT DETECTED
Candida glabrata: NOT DETECTED
Candida krusei: NOT DETECTED
Candida parapsilosis: NOT DETECTED
Candida tropicalis: NOT DETECTED
Cryptococcus neoformans/gattii: NOT DETECTED
Enterobacter cloacae complex: NOT DETECTED
Enterobacterales: NOT DETECTED
Enterococcus Faecium: NOT DETECTED
Enterococcus faecalis: DETECTED — AB
Escherichia coli: NOT DETECTED
Haemophilus influenzae: NOT DETECTED
Klebsiella aerogenes: NOT DETECTED
Klebsiella oxytoca: NOT DETECTED
Klebsiella pneumoniae: NOT DETECTED
Listeria monocytogenes: NOT DETECTED
Meth resistant mecA/C and MREJ: DETECTED — AB
Neisseria meningitidis: NOT DETECTED
Proteus species: NOT DETECTED
Pseudomonas aeruginosa: NOT DETECTED
Salmonella species: NOT DETECTED
Serratia marcescens: NOT DETECTED
Staphylococcus aureus (BCID): DETECTED — AB
Staphylococcus epidermidis: NOT DETECTED
Staphylococcus lugdunensis: NOT DETECTED
Staphylococcus species: DETECTED — AB
Stenotrophomonas maltophilia: NOT DETECTED
Streptococcus agalactiae: NOT DETECTED
Streptococcus pneumoniae: NOT DETECTED
Streptococcus pyogenes: NOT DETECTED
Streptococcus species: NOT DETECTED
Vancomycin resistance: NOT DETECTED

## 2023-03-07 LAB — HEPATITIS B SURFACE ANTIGEN: Hepatitis B Surface Ag: NONREACTIVE

## 2023-03-07 LAB — GLUCOSE, CAPILLARY
Glucose-Capillary: 113 mg/dL — ABNORMAL HIGH (ref 70–99)
Glucose-Capillary: 130 mg/dL — ABNORMAL HIGH (ref 70–99)
Glucose-Capillary: 90 mg/dL (ref 70–99)
Glucose-Capillary: 92 mg/dL (ref 70–99)

## 2023-03-07 LAB — T4, FREE: Free T4: 1.47 ng/dL — ABNORMAL HIGH (ref 0.61–1.12)

## 2023-03-07 LAB — MAGNESIUM: Magnesium: 1.8 mg/dL (ref 1.7–2.4)

## 2023-03-07 LAB — TSH: TSH: 0.588 u[IU]/mL (ref 0.350–4.500)

## 2023-03-07 MED ORDER — PENTAFLUOROPROP-TETRAFLUOROETH EX AERO: 1.0000 | INHALATION_SPRAY | CUTANEOUS | Status: DC | PRN

## 2023-03-07 MED ORDER — LIDOCAINE HCL (PF) 1 % IJ SOLN: 5.0000 mL | INTRAMUSCULAR | Status: DC | PRN

## 2023-03-07 MED ORDER — DARBEPOETIN ALFA 60 MCG/0.3ML IJ SOSY
60.0000 ug | PREFILLED_SYRINGE | INTRAMUSCULAR | Status: DC
  Filled 2023-03-07: qty 0.3

## 2023-03-07 MED ORDER — NEPRO/CARBSTEADY PO LIQD
237.0000 mL | Freq: Three times a day (TID) | ORAL | Status: DC
  Administered 2023-03-07 – 2023-03-29 (×35): 237 mL via ORAL

## 2023-03-07 MED ORDER — VANCOMYCIN HCL 1750 MG/350ML IV SOLN
1750.0000 mg | Freq: Once | INTRAVENOUS | Status: AC
  Administered 2023-03-07: 1750 mg via INTRAVENOUS
  Filled 2023-03-07: qty 350

## 2023-03-07 MED ORDER — LIDOCAINE-PRILOCAINE 2.5-2.5 % EX CREA: 1.0000 | TOPICAL_CREAM | CUTANEOUS | Status: DC | PRN

## 2023-03-07 MED ORDER — VANCOMYCIN HCL 750 MG/150ML IV SOLN: 750.0000 mg | INTRAVENOUS | Status: DC

## 2023-03-07 NOTE — TOC Initial Note (Signed)
Transition of Care [facility]) - Initial/Assessment Note    Patient Details  Name: [name] [name] MRN: [id number] Date of Birth: 31-Jul-1960  [facility] CM/SW Contact:    Elliot Gault, LCSW Phone Number: 03/07/2023, 1:32 PM  Clinical Narrative:                  Pt admitted from home. He has a high readmission risk score. Reviewed pt's record today. Pt is not yet fully oriented. Will follow up tomorrow for full assessment.  From the record, it appears pt is active with outpatient HD. Notes report that pt has been quite weak at home. Asked MD for PT eval once pt stable. Anticipating pt may need SNF rehab vs HH at dc.  TOC will follow.  Expected Discharge Plan: Skilled Nursing Facility Barriers to Discharge: Continued Medical Work up   Patient Goals and CMS Choice Patient states their goals for this hospitalization and ongoing recovery are:: get better          Expected Discharge Plan and Services In-house Referral: Clinical Social Work     Living arrangements for the past 2 months: Single Family Home                                      Prior Living Arrangements/Services Living arrangements for the past 2 months: Single Family Home Lives with:: Self Patient language and need for interpreter reviewed:: Yes Do you feel safe going back to the place where you live?: Yes      Need for Family Participation in Patient Care: No (Comment)     Criminal Activity/Legal Involvement Pertinent to Current Situation/Hospitalization: No - Comment as needed  Activities of Daily Living Home Assistive Devices/Equipment: CBG Meter, Wheelchair ADL Screening (condition at time of admission) Is the patient deaf or have difficulty hearing?: No Does the patient have difficulty seeing, even when wearing glasses/contacts?: No Does the patient have difficulty concentrating, remembering, or making decisions?: Yes  Permission Sought/Granted                  Emotional  Assessment       Orientation: : Oriented to Self, Oriented to Place Alcohol / Substance Use: Not Applicable Psych Involvement: No (comment)  Admission diagnosis:  Acute metabolic encephalopathy [G93.41] Patient Active Problem List   Diagnosis Date Noted   Acute metabolic encephalopathy 03/06/2023   Rectal bleeding 01/14/2023   Acute blood loss anemia 01/14/2023   Hypokalemia 01/12/2023   Proctitis 01/12/2023   Hypothyroidism 01/12/2023   GERD (gastroesophageal reflux disease) 01/12/2023   ESRD (end stage renal disease) (HCC) 01/12/2023   GI bleed 01/11/2023   Anemia of chronic renal failure 06/09/2022   Pulmonary hypertension, unspecified (HCC) 07/20/2021   Stage 3 chronic kidney disease (HCC) 11/27/2020   Acute on chronic combined systolic and diastolic CHF (congestive heart failure) (HCC) 09/09/2020   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   Anemia of chronic disease 09/09/2020   Acute respiratory failure with hypoxia (HCC) 09/09/2020   Anasarca 08/28/2020   Nephrotic syndrome 08/28/2020   Dilated cardiomyopathy (HCC) 08/17/2020   Neuropathy 07/12/2018   Osteomyelitis of great toe of right foot (HCC) 07/12/2018   Essential hypertension 02/14/2018   Uncontrolled type 2 diabetes mellitus with hyperglycemia (HCC) 01/08/2018   Hyperlipidemia associated with type 2 diabetes mellitus (HCC) 01/08/2018   PCP:  Farris Has, MD Pharmacy:   [id number] -  , [place] - [address] [place] [address] Phone: [phone] Fax: [phone]  [facility] [place], [place] - [address], [place] [address] [place] [address] [place] [address] Phone: [phone] Fax: [phone]     Social Determinants of Health (SDOH) Social History: SDOH Screenings   Food Insecurity: No Food Insecurity (03/06/2023)  Housing: Low Risk  (03/06/2023)  Transportation Needs: No Transportation Needs  (03/06/2023)  Utilities: Not At Risk (03/06/2023)  Depression (PHQ2-9): Low Risk  (09/18/2018)  Tobacco Use: Low Risk  (03/06/2023)   SDOH Interventions:     Readmission Risk Interventions    03/07/2023    1:31 PM  Readmission Risk Prevention Plan  Transportation Screening Complete  HRI or Home Care Consult Complete  Palliative Care Screening Not Applicable  Medication Review (RN Care Manager) Complete

## 2023-03-07 NOTE — Progress Notes (Addendum)
PROGRESS NOTE    [name] [name]  NWG:[id number] DOB: [birth year] DOA: 03/06/2023 PCP: Farris Has, MD   Brief Narrative:    [name] [name] is a 62 y.o. male with medical history significant for ESRD on HD MWF, hypertension, type 2 diabetes, pulmonary hypertension, cardiomyopathy, GERD, dyslipidemia, and anemia of CKD who presented to the ED with worsening mental status.  He was admitted with acute metabolic encephalopathy secondary to UTI/COVID-pneumonia as well as missed hemodialysis sessions.  Assessment & Plan:   Principal Problem:   Acute metabolic encephalopathy Active Problems:   Essential hypertension   Neuropathy   Anemia of chronic disease   Hypokalemia   Hypothyroidism   GERD (gastroesophageal reflux disease)   ESRD (end stage renal disease) (HCC)  Assessment and Plan:   Acute metabolic encephalopathy likely secondary to UTI/COVID and missed HD -Continue to monitor with treatment of UTI and Rocephin and follow cultures -Continue COVID treatment -Plan for hemodialysis today -Avoid sedating agents   COVID infection -Continue remdesivir -No need for steroids at this time  Question of Enterococcus and MRSA bacteremia -Started on vancomycin empirically -Appreciate ID evaluation   Mild hypokalemia -Appears to be a chronic issue -Adjustment with hemodialysis per nephrology   ESRD on HD MWF -Plan for hemodialysis today.   Anemia of chronic kidney disease -Currently stable, hold ESA for now -Transfuse for hemoglobin less than 7   Hypertension -Continue home blood pressure medications -Should improve with hemodialysis   Type 2 diabetes -Currently without hyperglycemia -Maintain on SSI every 4 hours -Carb modified diet -Avoid gabapentin for neuropathy given current altered mentation   Hypothyroidism -Continue levothyroxine and check TSH   GERD -PPI   Thrombocytopenia -Hold heparin agents -Monitor CBC   DVT prophylaxis: SCDs Code Status:  Full Family Communication: None at bedside Disposition Plan: Plan for hemodialysis, continue IV medications Status is: Inpatient Remains inpatient appropriate because: Need for IV medications  Consultants:  Nephrology ID  Procedures:  None  Antimicrobials:  Anti-infectives (From admission, onward)    Start     Dose/Rate Route Frequency Ordered Stop   03/07/23 1000  remdesivir 100 mg in sodium chloride 0.9 % 100 mL IVPB  Status:  Discontinued       Placed in "Followed by" Linked Group   100 mg 200 mL/hr over 30 Minutes Intravenous Daily 03/06/23 1638 03/06/23 1645   03/07/23 1000  remdesivir 100 mg in sodium chloride 0.9 % 100 mL IVPB       Placed in "Followed by" Linked Group   100 mg 200 mL/hr over 30 Minutes Intravenous Daily 03/06/23 1645 03/09/23 0959   03/06/23 1730  remdesivir 100 mg in sodium chloride 0.9 % 100 mL IVPB       Placed in "Followed by" Linked Group   100 mg 200 mL/hr over 30 Minutes Intravenous Every 30 min 03/06/23 1645 03/06/23 1909   03/06/23 1638  remdesivir 200 mg in sodium chloride 0.9% 250 mL IVPB  Status:  Discontinued       Placed in "Followed by" Linked Group   200 mg 580 mL/hr over 30 Minutes Intravenous Once 03/06/23 1638 03/06/23 1645   03/06/23 1500  cefTRIAXone (ROCEPHIN) 1 g in sodium chloride 0.9 % 100 mL IVPB        1 g 200 mL/hr over 30 Minutes Intravenous Every 24 hours 03/06/23 1440        Subjective: Patient seen and evaluated today with no new acute complaints or concerns. No acute concerns or events noted overnight.  He seems more awake and oriented.  Objective: Vitals:   03/06/23 1515 03/06/23 1631 03/06/23 2235 03/07/23 0441  BP: (!) 145/71 (!) 161/59 (!) 183/69 (!) 158/71  Pulse: 64 62 68 (!) 57  Resp:  16 18 20   Temp: 98.2 F (36.8 C) 98.7 F (37.1 C) 98.5 F (36.9 C) 98 F (36.7 C)  TempSrc: Oral Oral Oral   SpO2: 96% 97% 97% 95%  Weight:      Height:        Intake/Output Summary (Last 24 hours) at 03/07/2023  1003 Last data filed at 03/07/2023 0600 Gross per 24 hour  Intake 100.95 ml  Output --  Net 100.95 ml   Filed Weights   03/06/23 0947  Weight: 78 kg    Examination:  General exam: Appears calm and comfortable  Respiratory system: Clear to auscultation. Respiratory effort normal. Cardiovascular system: S1 & S2 heard, RRR.  Gastrointestinal system: Abdomen is soft Central nervous system: Alert and awake Extremities: No edema Skin: No significant lesions noted Psychiatry: Flat affect.    Data Reviewed: I have personally reviewed following labs and imaging studies  CBC: Recent Labs  Lab 03/06/23 1017 03/06/23 1026 03/07/23 0437  WBC  --  5.3 3.3*  NEUTROABS  --  4.0  --   HGB 14.3 13.4 10.4*  HCT 42.0 41.9 34.1*  MCV  --  89.5 90.7  PLT  --  99* 76*   Basic Metabolic Panel: Recent Labs  Lab 03/06/23 1017 03/06/23 1026 03/07/23 0437  NA 138 137 137  K 3.2* 3.0* 2.8*  CL 100 96* 100  CO2  --  27 26  GLUCOSE 99 99 111*  BUN 20 18 25*  CREATININE 6.90* 5.98* 6.81*  CALCIUM  --  7.9* 7.2*  MG  --   --  1.8   GFR: Estimated Creatinine Clearance: 11.3 mL/min (A) (by C-G formula based on SCr of 6.81 mg/dL (H)). Liver Function Tests: Recent Labs  Lab 03/06/23 1026  AST 21  ALT 17  ALKPHOS 154*  BILITOT 1.6*  PROT 6.5  ALBUMIN 2.6*   No results for input(s): "LIPASE", "AMYLASE" in the last 168 hours. Recent Labs  Lab 03/06/23 1026  AMMONIA 15   Coagulation Profile: Recent Labs  Lab 03/06/23 1026  INR 1.1   Cardiac Enzymes: No results for input(s): "CKTOTAL", "CKMB", "CKMBINDEX", "TROPONINI" in the last 168 hours. BNP (last 3 results) No results for input(s): "PROBNP" in the last 8760 hours. HbA1C: No results for input(s): "HGBA1C" in the last 72 hours. CBG: Recent Labs  Lab 03/06/23 0956 03/06/23 1615 03/06/23 2240  GLUCAP 90 95 130*   Lipid Profile: No results for input(s): "CHOL", "HDL", "LDLCALC", "TRIG", "CHOLHDL", "LDLDIRECT" in  the last 72 hours. Thyroid Function Tests: No results for input(s): "TSH", "T4TOTAL", "FREET4", "T3FREE", "THYROIDAB" in the last 72 hours. Anemia Panel: No results for input(s): "VITAMINB12", "FOLATE", "FERRITIN", "TIBC", "IRON", "RETICCTPCT" in the last 72 hours. Sepsis Labs: Recent Labs  Lab 03/06/23 1026  PROCALCITON 2.13  LATICACIDVEN 1.2    Recent Results (from the past 240 hour(s))  SARS Coronavirus 2 by RT PCR (hospital order, performed in [facility] hospital lab) *cepheid single result test* Anterior Nasal Swab     Status: Abnormal   Collection Time: 03/06/23 10:36 AM   Specimen: Anterior Nasal Swab  Result Value Ref Range Status   SARS Coronavirus 2 by RT PCR POSITIVE (A) NEGATIVE Final    Comment: (NOTE) SARS-CoV-2 target nucleic acids are DETECTED  SARS-CoV-2 RNA is generally detectable in upper respiratory specimens  during the acute phase of infection.  Positive results are indicative  of the presence of the identified virus, but do not rule out bacterial infection or co-infection with other pathogens not detected by the test.  Clinical correlation with patient history and  other diagnostic information is necessary to determine patient infection status.  The expected result is negative.  Fact Sheet for Patients:   RoadLapTop.co.za   Fact Sheet for Healthcare Providers:   http://kim-miller.com/    This test is not yet approved or cleared by the [place] FDA and  has been authorized for detection and/or diagnosis of SARS-CoV-2 by FDA under an Emergency Use Authorization (EUA).  This EUA will remain in effect (meaning this test can be used) for the duration of  the COVID-19 declaration under Section 564(b)(1)  of the Act, 21 U.S.C. section 360-bbb-3(b)(1), unless the authorization is terminated or revoked sooner.   Performed at [facility], [address], [place], [place] [address]   Culture, blood (routine x  2)     Status: None (Preliminary result)   Collection Time: 03/06/23 11:35 AM   Specimen: BLOOD  Result Value Ref Range Status   Specimen Description BLOOD BLOOD RIGHT ARM  Final   Special Requests   Final    BOTTLES DRAWN AEROBIC ONLY Blood Culture results may not be optimal due to an inadequate volume of blood received in culture bottles   Culture   Final    NO GROWTH < 24 HOURS Performed at [facility], [address] [place], [place] [address]    Report Status PENDING  Incomplete  Culture, blood (routine x 2)     Status: None (Preliminary result)   Collection Time: 03/06/23 11:35 AM   Specimen: BLOOD  Result Value Ref Range Status   Specimen Description   Final    BLOOD BLOOD LEFT HAND Performed at [facility], [address] [place], [place] [address]    Special Requests   Final    BOTTLES DRAWN AEROBIC ONLY Blood Culture adequate volume Performed at [facility], [address] [place], [place] [address]    Culture  Setup Time   Final    GRAM POSITIVE COCCI AEROBIC BOTTLE ONLY CRITICAL RESULT CALLED TO, READ BACK BY AND VERIFIED WITH: [facility] WEAVER @ 2956 ON 03/07/23 C VARNER Organism ID to follow Performed at [facility], [address] [place], [place] [address]    Culture GRAM POSITIVE COCCI  Final   Report Status PENDING  Incomplete  MRSA Next Gen by PCR, Nasal     Status: Abnormal   Collection Time: 03/06/23  6:44 PM   Specimen: Nasal Mucosa; Nasal Swab  Result Value Ref Range Status   MRSA by PCR Next Gen DETECTED (A) NOT DETECTED Final    Comment: RESULT CALLED TO, READ BACK BY AND VERIFIED WITH: [facility] BEAVER 2104 03/06/23 BY VIRAY,J (NOTE) The GeneXpert MRSA Assay (FDA approved for NASAL specimens only), is one component of a comprehensive MRSA colonization surveillance program. It is not intended to diagnose MRSA infection nor to guide or monitor treatment for MRSA infections. Test performance is not FDA approved in patients less than 37  years old. Performed at [facility], [address], [place], [place] [address]          Radiology Studies: DG Elbow 2 Views Right  Result Date: 03/07/2023 CLINICAL DATA:  Right elbow pain and swelling. EXAM: RIGHT ELBOW -  2 VIEW COMPARISON:  None Available. FINDINGS: There is no evidence of fracture, dislocation, or joint effusion. Mild degenerative changes are noted at the elbow. Diffuse soft tissue swelling is noted. Vascular calcifications are present in the soft tissues. IMPRESSION: No acute osseous abnormality. Electronically Signed   By: Thornell Sartorius M.D.   On: 03/07/2023 00:33   CT CHEST ABDOMEN PELVIS WO CONTRAST  Result Date: 03/06/2023 CLINICAL DATA:  Altered mental status, possible sepsis. EXAM: CT CHEST, ABDOMEN AND PELVIS WITHOUT CONTRAST TECHNIQUE: Multidetector CT imaging of the chest, abdomen and pelvis was performed following the standard protocol without IV contrast. RADIATION DOSE REDUCTION: This exam was performed according to the departmental dose-optimization program which includes automated exposure control, adjustment of the mA and/or kV according to patient size and/or use of iterative reconstruction technique. COMPARISON:  January 11, 2023. FINDINGS: CT CHEST FINDINGS Cardiovascular: Atherosclerosis of thoracic aorta is noted without aneurysm formation. Normal cardiac size. No pericardial effusion. Mediastinum/Nodes: No enlarged mediastinal, hilar, or axillary lymph nodes. Thyroid gland, trachea, and esophagus demonstrate no significant findings. Lungs/Pleura: Small moderate size bilateral pleural effusions are noted with adjacent subsegmental atelectasis. No pneumothorax. Musculoskeletal: No chest wall mass or suspicious bone lesions identified. CT ABDOMEN PELVIS FINDINGS Hepatobiliary: No cholelithiasis or biliary dilatation. 11 mm left hepatic low density is again noted. Pancreas: Fatty replacement of the pancreas is noted. Cystic areas seen in pancreatic tail on prior  exam are not well visualized currently. Spleen: Normal in size without focal abnormality. Adrenals/Urinary Tract: Adrenal glands and kidneys appear normal. No hydronephrosis or renal obstruction is noted. Urinary bladder is decompressed. Stomach/Bowel: Stomach is within normal limits. Appendix appears normal. No evidence of bowel wall thickening, distention, or inflammatory changes. Vascular/Lymphatic: Aortic atherosclerosis. No enlarged abdominal or pelvic lymph nodes. Reproductive: Prostate is unremarkable. Other: No abdominal wall hernia or abnormality. No abdominopelvic ascites. Musculoskeletal: No acute or significant osseous findings. IMPRESSION: Small to moderate size bilateral pleural effusions are noted with minimal adjacent subsegmental atelectasis. 11 mm left hepatic low density is noted as described on prior CT scan. This may simply represent cyst or hemangioma, but other pathology cannot be excluded. When the patient is clinically stable and able to follow directions and hold their breath (preferably as an outpatient) further evaluation with dedicated abdominal MRI should be considered. Fatty replacement of the pancreas. Cystic areas seen in pancreatic tail on prior exam are not well visualized currently. Aortic Atherosclerosis (ICD10-I70.0). Electronically Signed   By: Lupita Raider M.D.   On: 03/06/2023 14:21   CT Head Wo Contrast  Result Date: 03/06/2023 CLINICAL DATA:  Mental status change, unknown cause EXAM: CT HEAD WITHOUT CONTRAST TECHNIQUE: Contiguous axial images were obtained from the base of the skull through the vertex without intravenous contrast. RADIATION DOSE REDUCTION: This exam was performed according to the departmental dose-optimization program which includes automated exposure control, adjustment of the mA and/or kV according to patient size and/or use of iterative reconstruction technique. COMPARISON:  None Available. FINDINGS: Brain: There is atrophy and chronic small  vessel disease changes. Associated mild ventriculomegaly related to ex vacuo dilatation. No acute intracranial abnormality. Specifically, no hemorrhage, hydrocephalus, mass lesion, acute infarction, or significant intracranial injury. Vascular: No hyperdense vessel or unexpected calcification. Skull: No acute calvarial abnormality. Sinuses/Orbits: No acute findings Other: No IMPRESSION: Atrophy, chronic microvascular disease. No acute intracranial abnormality. Electronically Signed   By: Charlett Nose M.D.   On: 03/06/2023 13:29   DG Chest Port 1 View  Result Date: 03/06/2023 CLINICAL  DATA:  Altered mental status EXAM: PORTABLE CHEST - 1 VIEW COMPARISON:  12/27/2022 FINDINGS: Cardiomediastinal silhouette and pulmonary vasculature are within normal limits. Interval removal of tunneled right IJ hemodialysis catheter. Mild patchy opacities at the lung bases are new since prior study. IMPRESSION: New mild patchy lung base opacities could be related to atelectasis or pneumonia. Electronically Signed   By: Acquanetta Belling M.D.   On: 03/06/2023 11:41        Scheduled Meds:  amLODipine  5 mg Oral q morning   atorvastatin  20 mg Oral q morning   Chlorhexidine Gluconate Cloth  6 each Topical Q0600   [START ON 03/08/2023] darbepoetin (ARANESP) injection - DIALYSIS  60 mcg Subcutaneous Q Wed-1800   feeding supplement (NEPRO CARB STEADY)  237 mL Oral BID BM   insulin aspart  0-5 Units Subcutaneous QHS   insulin aspart  0-9 Units Subcutaneous TID WC   levothyroxine  50 mcg Oral QAC breakfast   metoprolol tartrate  50 mg Oral BID   multivitamin with minerals  1 tablet Oral Daily   pantoprazole  40 mg Oral Daily   sodium chloride flush  3 mL Intravenous Q12H   Continuous Infusions:  sodium chloride     cefTRIAXone (ROCEPHIN)  IV 1 g (03/06/23 1535)   remdesivir 100 mg in sodium chloride 0.9 % 100 mL IVPB       LOS: 1 day    Time spent: 35 minutes    Coco Sharpnack Hoover Brunette, DO [facility]  If  7PM-7AM, please contact night-coverage [email or web address] 03/07/2023, 10:03 AM

## 2023-03-07 NOTE — Progress Notes (Signed)
   HEMODIALYSIS TREATMENT NOTE:  Withdrawn, minimally conversant.  Difficult cannulation of AVF s/p infiltration.  Qb 300 to stabilize pressures.  Blood cultures x2 collected from AVF.  3.5 hour heparin-free treatment completed.  Goal was adjusted after early episode of hypotension.  Net UF 500 ml.  Meds given:  Remdesivir, Vancomycin.   Post-tx:  03/07/23 1600  Vitals  Temp 98.2 F (36.8 C)  Temp Source Oral  BP (!) 158/64  MAP (mmHg) 90  BP Location Right Arm  BP Method Automatic  Patient Position (if appropriate) Lying  Pulse Rate 69  Pulse Rate Source Monitor  ECG Heart Rate 68  Resp 18  Oxygen Therapy  SpO2 98 %  O2 Device Room Air  Post Treatment  Dialyzer Clearance Lightly streaked  Hemodialysis Intake (mL) 0 mL  Liters Processed 67  Fluid Removed (mL) 500 mL  Tolerated HD Treatment No (Comment) (UF was limited by hypotension early in tx, BP stabilized with lowering of UFR.)  Post-Hemodialysis Comments 10  AVG/AVF Arterial Site Held (minutes) 10 minutes  Fistula / Graft Left Upper arm Arteriovenous vein graft  Placement Date/Time: 12/27/22 1122   Placed prior to admission: No  Orientation: Left  Access Location: Upper arm  Access Type: Arteriovenous vein graft  Expiration Date: 06/18/27  Fistula / Graft Assessment Thrill;Bruit  Status Patent      Arman Filter, RN AP KDU

## 2023-03-07 NOTE — Plan of Care (Signed)
Problem: Education: Goal: Knowledge of General Education information will improve Description: Including pain rating scale, medication(s)/side effects and non-pharmacologic comfort measures Outcome: Progressing   Problem: Nutrition: Goal: Adequate nutrition will be maintained Outcome: Progressing

## 2023-03-07 NOTE — Progress Notes (Signed)
Initial Nutrition Assessment  DOCUMENTATION CODES:   Not applicable  INTERVENTION:   Nepro Shake po TID, each supplement provides 425 kcal and 19 grams protein Continue MVI with minerals daily  NUTRITION DIAGNOSIS:   Increased nutrient needs related to chronic illness (ESRD on HD) as evidenced by estimated needs.  GOAL:   Patient will meet greater than or equal to 90% of their needs  MONITOR:   PO intake, Supplement acceptance  REASON FOR ASSESSMENT:   Malnutrition Screening Tool    ASSESSMENT:   62 yo male admitted with acute metabolic encephalopathy d/t UTI and COVID PNA. PMH includes ESRD on HD M-W-F, HTN, DM-2, pulmonary HTN, cardiomyopathy, GERD, HLD, anemia of CKD.  Patient missed HD sessions PTA. He is receiving HD today, then will go back to M-W-F schedule. Unable to speak with patient or complete NFPE at this time. Noted he has been confused/not fully oriented today.  Currently on a renal, carb modified diet. Meal intakes not recorded.   Suspect intake has been poor given 14% weight loss within the past 3 months per review of usual weights. Suspect patient is malnourished; unable to obtain enough information at this time for identification of malnutrition.   Labs reviewed. K 2.8 CBG: 130-130  Medications reviewed and include Aranesp, Novolog, MVI with minerals, Protonix, remdesivir, IV antibiotics.  NUTRITION - FOCUSED PHYSICAL EXAM:  Unable to complete  Diet Order:   Diet Order             Diet renal/carb modified with fluid restriction Diet-HS Snack? Nothing; Fluid restriction: 1200 mL Fluid; Room service appropriate? Yes; Fluid consistency: Thin  Diet effective now                   EDUCATION NEEDS:   Not appropriate for education at this time  Skin:  Skin Assessment: Reviewed RN Assessment (pretibial skin tear x 2)  Last BM:  9/22  Height:   Ht Readings from Last 1 Encounters:  03/06/23 5\' 10"  (1.778 m)    Weight:   Wt Readings  from Last 1 Encounters:  03/07/23 63.8 kg    Ideal Body Weight:  75.5 kg  BMI:  Body mass index is 20.18 kg/m.  Estimated Nutritional Needs:   Kcal:  1900-2200  Protein:  85-100 gm  Fluid:  1 L + UOP   Gabriel Rainwater RD, LDN, CNSC Please refer to Amion for contact information.

## 2023-03-07 NOTE — Progress Notes (Signed)
Patient was positive for Gram Cocci in the aerobic bottle the second set. Dr Sherryll Burger notified. Plan of care on going.

## 2023-03-07 NOTE — Progress Notes (Signed)
Pharmacy Antibiotic Note  [name] [name] is a 62 y.o. male admitted on 03/06/2023 with  altered mental status and possible sepsis .  Pharmacy has been consulted for vancomycin dosing.  Patient with 1/2 blood cultures growing enterococcus and MRSA. Will add vancomycin. ID has been consulted to make further recommendations.   Patient is ESRD and is planning to have HD today 9/24 then resume outpatient schedule of MWF tomorrow.   Plan: Vancomycin 1750mg   x 1 now then 750mg  after each HD session Goal pre-HD level of 15-25  Height: 5\' 10"  (177.8 cm) Weight: 78 kg (172 lb 0.1 oz) IBW/kg (Calculated) : 73  Temp (24hrs), Avg:98.7 F (37.1 C), Min:97.9 F (36.6 C), Max:101.1 F (38.4 C)  Recent Labs  Lab 03/06/23 1017 03/06/23 1026 03/07/23 0437  WBC  --  5.3 3.3*  CREATININE 6.90* 5.98* 6.81*  LATICACIDVEN  --  1.2  --     Estimated Creatinine Clearance: 11.3 mL/min (A) (by C-G formula based on SCr of 6.81 mg/dL (H)).    No Known Allergies  Thank you for allowing pharmacy to be a part of this patient's care.  Sheppard Coil PharmD., BCPS Clinical Pharmacist 03/07/2023 11:15 AM

## 2023-03-07 NOTE — Progress Notes (Signed)
PHARMACY - PHYSICIAN COMMUNICATION CRITICAL VALUE ALERT - BLOOD CULTURE IDENTIFICATION (BCID)  [name] [name] is an 62 y.o. male who presented to [facility] on 03/06/2023 with a chief complaint of altered mental status and missed HD session.   Assessment:  Patient's last HD was last Wednesday 9/18, notes indicate possible infiltration of fistula on Friday. MD indicates the fistula does not look infected however blood cultures returned positive for enterococcus and MRSA this morning.   Name of physician (or Provider) Contacted: Sherryll Burger  Current antibiotics: ceftriaxone  Changes to prescribed antibiotics recommended:  Recommendations accepted by provider Will add vancomycin  Results for orders placed or performed during the hospital encounter of 03/06/23  Blood Culture ID Panel (Reflexed) (Collected: 03/06/2023 11:35 AM)  Result Value Ref Range   Enterococcus faecalis DETECTED (A) NOT DETECTED   Enterococcus Faecium NOT DETECTED NOT DETECTED   Listeria monocytogenes NOT DETECTED NOT DETECTED   Staphylococcus species DETECTED (A) NOT DETECTED   Staphylococcus aureus (BCID) DETECTED (A) NOT DETECTED   Staphylococcus epidermidis NOT DETECTED NOT DETECTED   Staphylococcus lugdunensis NOT DETECTED NOT DETECTED   Streptococcus species NOT DETECTED NOT DETECTED   Streptococcus agalactiae NOT DETECTED NOT DETECTED   Streptococcus pneumoniae NOT DETECTED NOT DETECTED   Streptococcus pyogenes NOT DETECTED NOT DETECTED   A.calcoaceticus-baumannii NOT DETECTED NOT DETECTED   Bacteroides fragilis NOT DETECTED NOT DETECTED   Enterobacterales NOT DETECTED NOT DETECTED   Enterobacter cloacae complex NOT DETECTED NOT DETECTED   Escherichia coli NOT DETECTED NOT DETECTED   Klebsiella aerogenes NOT DETECTED NOT DETECTED   Klebsiella oxytoca NOT DETECTED NOT DETECTED   Klebsiella pneumoniae NOT DETECTED NOT DETECTED   Proteus species NOT DETECTED NOT DETECTED   Salmonella species NOT DETECTED NOT DETECTED    Serratia marcescens NOT DETECTED NOT DETECTED   Haemophilus influenzae NOT DETECTED NOT DETECTED   Neisseria meningitidis NOT DETECTED NOT DETECTED   Pseudomonas aeruginosa NOT DETECTED NOT DETECTED   Stenotrophomonas maltophilia NOT DETECTED NOT DETECTED   Candida albicans NOT DETECTED NOT DETECTED   Candida auris NOT DETECTED NOT DETECTED   Candida glabrata NOT DETECTED NOT DETECTED   Candida krusei NOT DETECTED NOT DETECTED   Candida parapsilosis NOT DETECTED NOT DETECTED   Candida tropicalis NOT DETECTED NOT DETECTED   Cryptococcus neoformans/gattii NOT DETECTED NOT DETECTED   Meth resistant mecA/C and MREJ DETECTED (A) NOT DETECTED   Vancomycin resistance NOT DETECTED NOT DETECTED    Sheppard Coil PharmD., BCPS Clinical Pharmacist 03/07/2023 10:59 AM

## 2023-03-07 NOTE — Progress Notes (Signed)
[facility] Progress Note    Assessment/ Plan:   ESRD -outpatient HD orders: [facility]. MWF.  4 hours.  EDW 64 kg.  LUE AVG 15-gauge.  Flow rates: 400/autoflow 1.5.  3K/2.5 calcium.  No heparin.  Meds: Mircera 75 mcg every 2 weeks (last dose 9/11).  Venofer 50 mg q. weekly. -HD on MWF schedule typically, will give his AVF a rest, plan for HD today and then back on MWF schedule tomorrow -If issues with AVF, then we will need to arrange for a dialysis catheter   COVID pneumonia -Per primary service, receiving remdesivir   Altered mental status -Possibly related to sepsis, will see if he improves with dialysis   Volume/ hypertension  -UF as tolerated   Anemia of Chronic Kidney Disease -Hemoglobin 10.4. Due for ESA this week, will order for tomorrow -Transfuse PRN for Hgb <7   Secondary Hyperparathyroidism/Hyperphosphatemia -Resume home binders if on any.  Check phosphorus   Hypokalemia -Chronic -caution with repletion   Thrombocytopenia -Workup/management per primary service  Subjective:   Patient seen and examined bedside. No acute events. No complaints. Denies any chest pain, SOB, LUE pain.   Objective:   BP (!) 158/71 (BP Location: Right Arm)   Pulse (!) 57   Temp 98 F (36.7 C)   Resp 20   Ht 5\' 10"  (1.778 m)   Wt 78 kg   SpO2 95%   BMI 24.68 kg/m   Intake/Output Summary (Last 24 hours) at 03/07/2023 0801 Last data filed at 03/07/2023 0600 Gross per 24 hour  Intake 100.95 ml  Output --  Net 100.95 ml   Weight change:   Physical Exam: Gen: NAD, sitting up in bed eating breakfast CVS: RRR Resp: normal wob Abd: soft Ext: no sig edema b/l Les Neuro: awake, alert Dialysis access: LUE AVF +b/t  Imaging: DG Elbow 2 Views Right  Result Date: 03/07/2023 CLINICAL DATA:  Right elbow pain and swelling. EXAM: RIGHT ELBOW - 2 VIEW COMPARISON:  None Available. FINDINGS: There is no evidence of fracture, dislocation, or joint effusion. Mild  degenerative changes are noted at the elbow. Diffuse soft tissue swelling is noted. Vascular calcifications are present in the soft tissues. IMPRESSION: No acute osseous abnormality. Electronically Signed   By: Thornell Sartorius M.D.   On: 03/07/2023 00:33   CT CHEST ABDOMEN PELVIS WO CONTRAST  Result Date: 03/06/2023 CLINICAL DATA:  Altered mental status, possible sepsis. EXAM: CT CHEST, ABDOMEN AND PELVIS WITHOUT CONTRAST TECHNIQUE: Multidetector CT imaging of the chest, abdomen and pelvis was performed following the standard protocol without IV contrast. RADIATION DOSE REDUCTION: This exam was performed according to the departmental dose-optimization program which includes automated exposure control, adjustment of the mA and/or kV according to patient size and/or use of iterative reconstruction technique. COMPARISON:  January 11, 2023. FINDINGS: CT CHEST FINDINGS Cardiovascular: Atherosclerosis of thoracic aorta is noted without aneurysm formation. Normal cardiac size. No pericardial effusion. Mediastinum/Nodes: No enlarged mediastinal, hilar, or axillary lymph nodes. Thyroid gland, trachea, and esophagus demonstrate no significant findings. Lungs/Pleura: Small moderate size bilateral pleural effusions are noted with adjacent subsegmental atelectasis. No pneumothorax. Musculoskeletal: No chest wall mass or suspicious bone lesions identified. CT ABDOMEN PELVIS FINDINGS Hepatobiliary: No cholelithiasis or biliary dilatation. 11 mm left hepatic low density is again noted. Pancreas: Fatty replacement of the pancreas is noted. Cystic areas seen in pancreatic tail on prior exam are not well visualized currently. Spleen: Normal in size without focal abnormality. Adrenals/Urinary Tract: Adrenal glands and kidneys appear  normal. No hydronephrosis or renal obstruction is noted. Urinary bladder is decompressed. Stomach/Bowel: Stomach is within normal limits. Appendix appears normal. No evidence of bowel wall thickening,  distention, or inflammatory changes. Vascular/Lymphatic: Aortic atherosclerosis. No enlarged abdominal or pelvic lymph nodes. Reproductive: Prostate is unremarkable. Other: No abdominal wall hernia or abnormality. No abdominopelvic ascites. Musculoskeletal: No acute or significant osseous findings. IMPRESSION: Small to moderate size bilateral pleural effusions are noted with minimal adjacent subsegmental atelectasis. 11 mm left hepatic low density is noted as described on prior CT scan. This may simply represent cyst or hemangioma, but other pathology cannot be excluded. When the patient is clinically stable and able to follow directions and hold their breath (preferably as an outpatient) further evaluation with dedicated abdominal MRI should be considered. Fatty replacement of the pancreas. Cystic areas seen in pancreatic tail on prior exam are not well visualized currently. Aortic Atherosclerosis (ICD10-I70.0). Electronically Signed   By: Lupita Raider M.D.   On: 03/06/2023 14:21   CT Head Wo Contrast  Result Date: 03/06/2023 CLINICAL DATA:  Mental status change, unknown cause EXAM: CT HEAD WITHOUT CONTRAST TECHNIQUE: Contiguous axial images were obtained from the base of the skull through the vertex without intravenous contrast. RADIATION DOSE REDUCTION: This exam was performed according to the departmental dose-optimization program which includes automated exposure control, adjustment of the mA and/or kV according to patient size and/or use of iterative reconstruction technique. COMPARISON:  None Available. FINDINGS: Brain: There is atrophy and chronic small vessel disease changes. Associated mild ventriculomegaly related to ex vacuo dilatation. No acute intracranial abnormality. Specifically, no hemorrhage, hydrocephalus, mass lesion, acute infarction, or significant intracranial injury. Vascular: No hyperdense vessel or unexpected calcification. Skull: No acute calvarial abnormality. Sinuses/Orbits: No  acute findings Other: No IMPRESSION: Atrophy, chronic microvascular disease. No acute intracranial abnormality. Electronically Signed   By: Charlett Nose M.D.   On: 03/06/2023 13:29   DG Chest Port 1 View  Result Date: 03/06/2023 CLINICAL DATA:  Altered mental status EXAM: PORTABLE CHEST - 1 VIEW COMPARISON:  12/27/2022 FINDINGS: Cardiomediastinal silhouette and pulmonary vasculature are within normal limits. Interval removal of tunneled right IJ hemodialysis catheter. Mild patchy opacities at the lung bases are new since prior study. IMPRESSION: New mild patchy lung base opacities could be related to atelectasis or pneumonia. Electronically Signed   By: Acquanetta Belling M.D.   On: 03/06/2023 11:41    Labs: BMET Recent Labs  Lab 03/06/23 1017 03/06/23 1026 03/07/23 0437  NA 138 137 137  K 3.2* 3.0* 2.8*  CL 100 96* 100  CO2  --  27 26  GLUCOSE 99 99 111*  BUN 20 18 25*  CREATININE 6.90* 5.98* 6.81*  CALCIUM  --  7.9* 7.2*   CBC Recent Labs  Lab 03/06/23 1017 03/06/23 1026 03/07/23 0437  WBC  --  5.3 3.3*  NEUTROABS  --  4.0  --   HGB 14.3 13.4 10.4*  HCT 42.0 41.9 34.1*  MCV  --  89.5 90.7  PLT  --  99* 76*    Medications:     amLODipine  5 mg Oral q morning   atorvastatin  20 mg Oral q morning   Chlorhexidine Gluconate Cloth  6 each Topical Q0600   feeding supplement (NEPRO CARB STEADY)  237 mL Oral BID BM   insulin aspart  0-5 Units Subcutaneous QHS   insulin aspart  0-9 Units Subcutaneous TID WC   levothyroxine  50 mcg Oral QAC breakfast   metoprolol  tartrate  50 mg Oral BID   multivitamin with minerals  1 tablet Oral Daily   pantoprazole  40 mg Oral Daily   sodium chloride flush  3 mL Intravenous Q12H      Anthony Sar, MD [facility] 03/07/2023, 8:01 AM

## 2023-03-07 NOTE — Progress Notes (Signed)
Mobility Specialist Progress Note:    03/07/23 1045  Mobility  Activity  (Heel slides, Leg raises)  Level of Assistance Independent  Assistive Device None  Distance Ambulated (ft) 0 ft  Range of Motion/Exercises Active;Right leg;Left leg  Activity Response Tolerated well  Mobility Referral Yes  $Mobility charge 1 Mobility  Mobility Specialist Start Time (ACUTE ONLY) 1045  Mobility Specialist Stop Time (ACUTE ONLY) 1055  Mobility Specialist Time Calculation (min) (ACUTE ONLY) 10 min   Pt received in bed, agreeable to mobility. Deferred ambulation because pt stated "I can't walk". Deferred standing or sitting EOB because pt stated "I can not sit up". Pt was agreeable to heel slides and leg raises while supine. Tolerated well, asx throughout. Left pt in bed, alarm on, all needs met.   Lawerance Bach Mobility Specialist Please contact via Special educational needs teacher or  Rehab office at [phone]

## 2023-03-08 ENCOUNTER — Inpatient Hospital Stay (HOSPITAL_COMMUNITY): Payer: Medicare HMO

## 2023-03-08 DIAGNOSIS — R7881 Bacteremia: Secondary | ICD-10-CM | POA: Diagnosis not present

## 2023-03-08 DIAGNOSIS — B9562 Methicillin resistant Staphylococcus aureus infection as the cause of diseases classified elsewhere: Secondary | ICD-10-CM | POA: Diagnosis not present

## 2023-03-08 DIAGNOSIS — G9341 Metabolic encephalopathy: Secondary | ICD-10-CM | POA: Diagnosis not present

## 2023-03-08 LAB — GLUCOSE, CAPILLARY
Glucose-Capillary: 90 mg/dL (ref 70–99)
Glucose-Capillary: 92 mg/dL (ref 70–99)
Glucose-Capillary: 129 mg/dL — ABNORMAL HIGH (ref 70–99)
Glucose-Capillary: 158 mg/dL — ABNORMAL HIGH (ref 70–99)

## 2023-03-08 LAB — MAGNESIUM: Magnesium: 1.8 mg/dL (ref 1.7–2.4)

## 2023-03-08 LAB — BASIC METABOLIC PANEL
Anion gap: 12 (ref 5–15)
BUN: 15 mg/dL (ref 8–23)
CO2: 25 mmol/L (ref 22–32)
Calcium: 7.2 mg/dL — ABNORMAL LOW (ref 8.9–10.3)
Chloride: 99 mmol/L (ref 98–111)
Creatinine, Ser: 4.18 mg/dL — ABNORMAL HIGH (ref 0.61–1.24)
GFR, Estimated: 15 mL/min — ABNORMAL LOW (ref >60)
Glucose, Bld: 87 mg/dL (ref 70–99)
Potassium: 3.6 mmol/L (ref 3.5–5.1)
Sodium: 136 mmol/L (ref 135–145)

## 2023-03-08 LAB — CULTURE, BLOOD (ROUTINE X 2)

## 2023-03-08 LAB — CBC
HCT: 37.1 % — ABNORMAL LOW (ref 39.0–52.0)
Hemoglobin: 11.6 g/dL — ABNORMAL LOW (ref 13.0–17.0)
MCH: 28.5 pg (ref 26.0–34.0)
MCHC: 31.3 g/dL (ref 30.0–36.0)
MCV: 91.2 fL (ref 80.0–100.0)
Platelets: 61 10*3/uL — ABNORMAL LOW (ref 150–400)
RBC: 4.07 MIL/uL — ABNORMAL LOW (ref 4.22–5.81)
RDW: 14.7 % (ref 11.5–15.5)
WBC: 6 10*3/uL (ref 4.0–10.5)
nRBC: 0 % (ref 0.0–0.2)

## 2023-03-08 LAB — HEPATITIS B SURFACE ANTIBODY, QUANTITATIVE: Hep B S AB Quant (Post): 1618 m[IU]/mL

## 2023-03-08 MED ORDER — LEVOTHYROXINE SODIUM 25 MCG PO TABS
25.0000 ug | ORAL_TABLET | Freq: Every day | ORAL | Status: DC
  Administered 2023-03-09 – 2023-03-29 (×16): 25 ug via ORAL
  Filled 2023-03-08 (×18): qty 1

## 2023-03-08 MED ORDER — VANCOMYCIN HCL 750 MG/150ML IV SOLN: 750.0000 mg | INTRAVENOUS | Status: DC

## 2023-03-08 MED ORDER — VANCOMYCIN HCL 750 MG/150ML IV SOLN
750.0000 mg | INTRAVENOUS | Status: AC
  Administered 2023-03-10: 750 mg via INTRAVENOUS
  Filled 2023-03-08: qty 150

## 2023-03-08 NOTE — Progress Notes (Signed)
   03/08/23 1015  Vitals  Temp 97.6 F (36.4 C)  Temp Source Oral  BP (!) 115/59  MAP (mmHg) 69  BP Location Right Wrist  BP Method Automatic  Patient Position (if appropriate) Lying  Pulse Rate 66  Pulse Rate Source Dinamap  Level of Consciousness  Level of Consciousness Alert  MEWS COLOR  MEWS Score Color Green  Oxygen Therapy  SpO2 100 %  O2 Device Room Air  Pain Assessment  Pain Scale 0-10  Pain Score 0  MEWS Score  MEWS Temp 0  MEWS Systolic 0  MEWS Pulse 0  MEWS RR 0  MEWS LOC 0  MEWS Score 0

## 2023-03-08 NOTE — Progress Notes (Signed)
PROGRESS NOTE    Elgin Aguayo  KGM:[id number] DOB: 12/19/1960 DOA: 03/06/2023 PCP: Farris Has, MD   Brief Narrative:    [name] [name] is a 62 y.o. male with medical history significant for ESRD on HD MWF, hypertension, type 2 diabetes, pulmonary hypertension, cardiomyopathy, GERD, dyslipidemia, and anemia of CKD who presented to the ED with worsening mental status.  He was admitted with acute metabolic encephalopathy secondary to UTI/COVID-pneumonia as well as missed hemodialysis sessions.  There is now concern of MRSA and enterococcal bacteremia and he has been started on vancomycin with ID consulted.  Assessment & Plan:   Principal Problem:   Acute metabolic encephalopathy Active Problems:   Essential hypertension   Neuropathy   Anemia of chronic disease   Hypokalemia   Hypothyroidism   GERD (gastroesophageal reflux disease)   ESRD (end stage renal disease) (HCC)  Assessment and Plan:   Acute metabolic encephalopathy likely secondary to UTI/COVID and missed HD -Continue to monitor with treatment of UTI and Rocephin and follow cultures -Continue COVID treatment -Plan for hemodialysis tomorrow -Avoid sedating agents -Plan for PT/OT evaluation   COVID infection -Remdesivir course completed -No need for steroids at this time  Question of Enterococcus and MRSA bacteremia -Started on vancomycin empirically -Appreciate ID evaluation   Mild hypokalemia -Currently resolved   ESRD on HD MWF -Plan for further hemodialysis 9/26   Anemia of chronic kidney disease -Currently stable, hold ESA for now -Transfuse for hemoglobin less than 7   Hypertension -Continue home blood pressure medications -Should improve with hemodialysis   Type 2 diabetes -Currently without hyperglycemia -Maintain on SSI every 4 hours -Carb modified diet -Avoid gabapentin for neuropathy given current altered mentation   Hypothyroidism -Continue levothyroxine and TSH 0.588 with T4  1.47 -Plan to reduce levothyroxine dose   GERD -PPI   Thrombocytopenia -Hold heparin agents -Monitor CBC   DVT prophylaxis: SCDs Code Status: Full Family Communication: None at bedside Disposition Plan: Plan for hemodialysis, continue IV medications Status is: Inpatient Remains inpatient appropriate because: Need for IV medications  Consultants:  Nephrology ID  Procedures:  None  Antimicrobials:  Anti-infectives (From admission, onward)    Start     Dose/Rate Route Frequency Ordered Stop   03/10/23 1600  vancomycin (VANCOREADY) IVPB 750 mg/150 mL        750 mg 150 mL/hr over 60 Minutes Intravenous Every M-W-F (Hemodialysis) 03/08/23 1106     03/09/23 1600  vancomycin (VANCOREADY) IVPB 750 mg/150 mL        750 mg 150 mL/hr over 60 Minutes Intravenous Every T-Th-Sa (Hemodialysis) 03/08/23 1106 03/11/23 1559   03/08/23 1200  vancomycin (VANCOREADY) IVPB 750 mg/150 mL  Status:  Discontinued        750 mg 150 mL/hr over 60 Minutes Intravenous Every M-W-F (Hemodialysis) 03/07/23 1051 03/08/23 1106   03/07/23 1145  vancomycin (VANCOREADY) IVPB 1750 mg/350 mL        1,750 mg 175 mL/hr over 120 Minutes Intravenous  Once 03/07/23 1051 03/07/23 1617   03/07/23 1000  remdesivir 100 mg in sodium chloride 0.9 % 100 mL IVPB  Status:  Discontinued       Placed in "Followed by" Linked Group   100 mg 200 mL/hr over 30 Minutes Intravenous Daily 03/06/23 1638 03/06/23 1645   03/07/23 1000  remdesivir 100 mg in sodium chloride 0.9 % 100 mL IVPB       Placed in "Followed by" Linked Group   100 mg 200 mL/hr over 30 Minutes Intravenous  Daily 03/06/23 1645 03/09/23 0959   03/06/23 1730  remdesivir 100 mg in sodium chloride 0.9 % 100 mL IVPB       Placed in "Followed by" Linked Group   100 mg 200 mL/hr over 30 Minutes Intravenous Every 30 min 03/06/23 1645 03/06/23 1909   03/06/23 1638  remdesivir 200 mg in sodium chloride 0.9% 250 mL IVPB  Status:  Discontinued       Placed in "Followed  by" Linked Group   200 mg 580 mL/hr over 30 Minutes Intravenous Once 03/06/23 1638 03/06/23 1645   03/06/23 1500  cefTRIAXone (ROCEPHIN) 1 g in sodium chloride 0.9 % 100 mL IVPB  Status:  Discontinued        1 g 200 mL/hr over 30 Minutes Intravenous Every 24 hours 03/06/23 1440 03/07/23 1429      Subjective: Patient seen and evaluated today with no new acute complaints or concerns. No acute concerns or events noted overnight.  He denies any specific complaints or concerns and received hemodialysis yesterday.  Objective: Vitals:   03/07/23 1600 03/07/23 2157 03/08/23 0528 03/08/23 1015  BP: (!) 158/64 (!) 180/72 (!) 175/72 (!) 115/59  Pulse: 69 74 63 66  Resp: 18 18 18    Temp: 98.2 F (36.8 C) 98.1 F (36.7 C) 97.7 F (36.5 C) 97.6 F (36.4 C)  TempSrc: Oral   Oral  SpO2: 98% 98% 99% 100%  Weight: 63.1 kg     Height:        Intake/Output Summary (Last 24 hours) at 03/08/2023 1121 Last data filed at 03/07/2023 1749 Gross per 24 hour  Intake 590 ml  Output 500 ml  Net 90 ml   Filed Weights   03/06/23 0947 03/07/23 1140 03/07/23 1600  Weight: 78 kg 63.8 kg 63.1 kg    Examination:  General exam: Appears calm and comfortable  Respiratory system: Clear to auscultation. Respiratory effort normal. Cardiovascular system: S1 & S2 heard, RRR.  Gastrointestinal system: Abdomen is soft Central nervous system: Alert and awake Extremities: No edema Skin: No significant lesions noted Psychiatry: Flat affect.    Data Reviewed: I have personally reviewed following labs and imaging studies  CBC: Recent Labs  Lab 03/06/23 1017 03/06/23 1026 03/07/23 0437 03/08/23 0542  WBC  --  5.3 3.3* 6.0  NEUTROABS  --  4.0  --   --   HGB 14.3 13.4 10.4* 11.6*  HCT 42.0 41.9 34.1* 37.1*  MCV  --  89.5 90.7 91.2  PLT  --  99* 76* 61*   Basic Metabolic Panel: Recent Labs  Lab 03/06/23 1017 03/06/23 1026 03/07/23 0437 03/08/23 0542  NA 138 137 137 136  K 3.2* 3.0* 2.8* 3.6  CL  100 96* 100 99  CO2  --  27 26 25   GLUCOSE 99 99 111* 87  BUN 20 18 25* 15  CREATININE 6.90* 5.98* 6.81* 4.18*  CALCIUM  --  7.9* 7.2* 7.2*  MG  --   --  1.8 1.8   GFR: Estimated Creatinine Clearance: 15.9 mL/min (A) (by C-G formula based on SCr of 4.18 mg/dL (H)). Liver Function Tests: Recent Labs  Lab 03/06/23 1026  AST 21  ALT 17  ALKPHOS 154*  BILITOT 1.6*  PROT 6.5  ALBUMIN 2.6*   No results for input(s): "LIPASE", "AMYLASE" in the last 168 hours. Recent Labs  Lab 03/06/23 1026  AMMONIA 15   Coagulation Profile: Recent Labs  Lab 03/06/23 1026  INR 1.1   Cardiac Enzymes: No  results for input(s): "CKTOTAL", "CKMB", "CKMBINDEX", "TROPONINI" in the last 168 hours. BNP (last 3 results) No results for input(s): "PROBNP" in the last 8760 hours. HbA1C: No results for input(s): "HGBA1C" in the last 72 hours. CBG: Recent Labs  Lab 03/07/23 1139 03/07/23 1706 03/07/23 2157 03/08/23 0744 03/08/23 1106  GLUCAP 130* 90 92 90 92   Lipid Profile: No results for input(s): "CHOL", "HDL", "LDLCALC", "TRIG", "CHOLHDL", "LDLDIRECT" in the last 72 hours. Thyroid Function Tests: Recent Labs    03/07/23 0435 03/07/23 0436  TSH  --  0.588  FREET4 1.47*  --    Anemia Panel: No results for input(s): "VITAMINB12", "FOLATE", "FERRITIN", "TIBC", "IRON", "RETICCTPCT" in the last 72 hours. Sepsis Labs: Recent Labs  Lab 03/06/23 1026  PROCALCITON 2.13  LATICACIDVEN 1.2    Recent Results (from the past 240 hour(s))  SARS Coronavirus 2 by RT PCR (hospital order, performed in [facility] hospital lab) *cepheid single result test* Anterior Nasal Swab     Status: Abnormal   Collection Time: 03/06/23 10:36 AM   Specimen: Anterior Nasal Swab  Result Value Ref Range Status   SARS Coronavirus 2 by RT PCR POSITIVE (A) NEGATIVE Final    Comment: (NOTE) SARS-CoV-2 target nucleic acids are DETECTED  SARS-CoV-2 RNA is generally detectable in upper respiratory specimens  during  the acute phase of infection.  Positive results are indicative  of the presence of the identified virus, but do not rule out bacterial infection or co-infection with other pathogens not detected by the test.  Clinical correlation with patient history and  other diagnostic information is necessary to determine patient infection status.  The expected result is negative.  Fact Sheet for Patients:   RoadLapTop.co.za   Fact Sheet for Healthcare Providers:   http://kim-miller.com/    This test is not yet approved or cleared by the [place] FDA and  has been authorized for detection and/or diagnosis of SARS-CoV-2 by FDA under an Emergency Use Authorization (EUA).  This EUA will remain in effect (meaning this test can be used) for the duration of  the COVID-19 declaration under Section 564(b)(1)  of the Act, 21 U.S.C. section 360-bbb-3(b)(1), unless the authorization is terminated or revoked sooner.   Performed at [facility], [address] [place], [place] [address]   Culture, blood (routine x 2)     Status: Abnormal (Preliminary result)   Collection Time: 03/06/23 11:35 AM   Specimen: BLOOD  Result Value Ref Range Status   Specimen Description   Final    BLOOD BLOOD RIGHT ARM Performed at [facility], [address], [place], [place] [address]    Special Requests   Final    BOTTLES DRAWN AEROBIC ONLY Blood Culture results may not be optimal due to an inadequate volume of blood received in culture bottles Performed at [facility], [address] [place], [place] [address]    Culture  Setup Time   Final    GRAM POSITIVE COCCI AEROBIC BOTTLE ONLY Gram Stain Report Called to,Read Back By and Verified With: DILDY,V. ON 03/07/2023 AT 1140 BY FRATTO,A. Performed at [facility], [address], [place], [place] [address]    Culture STAPHYLOCOCCUS AUREUS (A)  Final   Report Status PENDING  Incomplete  Culture, blood (routine x 2)      Status: Abnormal (Preliminary result)   Collection Time: 03/06/23 11:35 AM   Specimen: BLOOD  Result Value Ref Range Status   Specimen Description   Final    BLOOD  BLOOD LEFT HAND Performed at [facility], [address], [place], [place] [address]    Special Requests   Final    BOTTLES DRAWN AEROBIC ONLY Blood Culture adequate volume Performed at [facility], [address] [place], [place] [address]    Culture  Setup Time   Final    GRAM POSITIVE COCCI AEROBIC BOTTLE ONLY CRITICAL RESULT CALLED TO, READ BACK BY AND VERIFIED WITH: [facility] WEAVER @ 9528 ON 03/07/23 C VARNER CRITICAL RESULT CALLED TO, READ BACK BY AND VERIFIED WITH: PHARMD J MENDENHALL [id number] AT 1019 BY CM    Culture (A)  Final    STAPHYLOCOCCUS AUREUS ENTEROCOCCUS FAECALIS CULTURE REINCUBATED FOR BETTER GROWTH Performed at [facility], [address] [place], [place] [address]    Report Status PENDING  Incomplete  Blood Culture ID Panel (Reflexed)     Status: Abnormal   Collection Time: 03/06/23 11:35 AM  Result Value Ref Range Status   Enterococcus faecalis DETECTED (A) NOT DETECTED Final    Comment: CRITICAL RESULT CALLED TO, READ BACK BY AND VERIFIED WITH: PHARMD J MENEDENHALL [id number] AT 1019 BY CM    Enterococcus Faecium NOT DETECTED NOT DETECTED Final   Listeria monocytogenes NOT DETECTED NOT DETECTED Final   Staphylococcus species DETECTED (A) NOT DETECTED Final    Comment: CRITICAL RESULT CALLED TO, READ BACK BY AND VERIFIED WITH: PHARMD J MENDENHALL [id number] AT 1019 BY CM    Staphylococcus aureus (BCID) DETECTED (A) NOT DETECTED Final    Comment: Methicillin (oxacillin)-resistant Staphylococcus aureus (MRSA). MRSA is predictably resistant to beta-lactam antibiotics (except ceftaroline). Preferred therapy is vancomycin unless clinically contraindicated. Patient requires contact precautions if  hospitalized. CRITICAL RESULT CALLED TO, READ BACK BY AND VERIFIED WITH: PHARMD J MENEDENHALL [id number] AT  1019 BY CM    Staphylococcus epidermidis NOT DETECTED NOT DETECTED Final   Staphylococcus lugdunensis NOT DETECTED NOT DETECTED Final   Streptococcus species NOT DETECTED NOT DETECTED Final   Streptococcus agalactiae NOT DETECTED NOT DETECTED Final   Streptococcus pneumoniae NOT DETECTED NOT DETECTED Final   Streptococcus pyogenes NOT DETECTED NOT DETECTED Final   A.calcoaceticus-baumannii NOT DETECTED NOT DETECTED Final   Bacteroides fragilis NOT DETECTED NOT DETECTED Final   Enterobacterales NOT DETECTED NOT DETECTED Final   Enterobacter cloacae complex NOT DETECTED NOT DETECTED Final   Escherichia coli NOT DETECTED NOT DETECTED Final   Klebsiella aerogenes NOT DETECTED NOT DETECTED Final   Klebsiella oxytoca NOT DETECTED NOT DETECTED Final   Klebsiella pneumoniae NOT DETECTED NOT DETECTED Final   Proteus species NOT DETECTED NOT DETECTED Final   Salmonella species NOT DETECTED NOT DETECTED Final   Serratia marcescens NOT DETECTED NOT DETECTED Final   Haemophilus influenzae NOT DETECTED NOT DETECTED Final   Neisseria meningitidis NOT DETECTED NOT DETECTED Final   Pseudomonas aeruginosa NOT DETECTED NOT DETECTED Final   Stenotrophomonas maltophilia NOT DETECTED NOT DETECTED Final   Candida albicans NOT DETECTED NOT DETECTED Final   Candida auris NOT DETECTED NOT DETECTED Final   Candida glabrata NOT DETECTED NOT DETECTED Final   Candida krusei NOT DETECTED NOT DETECTED Final   Candida parapsilosis NOT DETECTED NOT DETECTED Final   Candida tropicalis NOT DETECTED NOT DETECTED Final   Cryptococcus neoformans/gattii NOT DETECTED NOT DETECTED Final   Meth resistant mecA/C and MREJ DETECTED (A) NOT DETECTED Final    Comment: CRITICAL RESULT CALLED TO, READ BACK BY AND VERIFIED WITH: PHARMD J MENEDENHALL [id number] AT 1019 BY CM    Vancomycin resistance NOT DETECTED  NOT DETECTED Final    Comment: Performed at [facility], [address] [place], [place] [address]  MRSA Next Gen by  PCR, Nasal     Status: Abnormal   Collection Time: 03/06/23  6:44 PM   Specimen: Nasal Mucosa; Nasal Swab  Result Value Ref Range Status   MRSA by PCR Next Gen DETECTED (A) NOT DETECTED Final    Comment: RESULT CALLED TO, READ BACK BY AND VERIFIED WITH: [facility] BEAVER 2104 03/06/23 BY VIRAY,J (NOTE) The GeneXpert MRSA Assay (FDA approved for NASAL specimens only), is one component of a comprehensive MRSA colonization surveillance program. It is not intended to diagnose MRSA infection nor to guide or monitor treatment for MRSA infections. Test performance is not FDA approved in patients less than 70 years old. Performed at [facility], [address] [place], [place] [address]   Culture, blood (Routine X 2) w Reflex to ID Panel     Status: None (Preliminary result)   Collection Time: 03/07/23 12:08 PM   Specimen: BLOOD  Result Value Ref Range Status   Specimen Description BLOOD ARTERIAL DRAW  Final   Special Requests   Final    BOTTLES DRAWN AEROBIC AND ANAEROBIC Blood Culture results may not be optimal due to an excessive volume of blood received in culture bottles   Culture   Final    NO GROWTH < 24 HOURS Performed at [facility], [address] [place], [place] [address]    Report Status PENDING  Incomplete  Culture, blood (Routine X 2) w Reflex to ID Panel     Status: None (Preliminary result)   Collection Time: 03/07/23 12:24 PM   Specimen: BLOOD  Result Value Ref Range Status   Specimen Description BLOOD ARTERIAL DRAW  Final   Special Requests   Final    BOTTLES DRAWN AEROBIC AND ANAEROBIC Blood Culture results may not be optimal due to an excessive volume of blood received in culture bottles   Culture   Final    NO GROWTH < 24 HOURS Performed at [facility], [address], [place], [place] [address]    Report Status PENDING  Incomplete         Radiology Studies: DG Elbow 2 Views Right  Result Date: 03/07/2023 CLINICAL DATA:  Right elbow pain and swelling. EXAM:  RIGHT ELBOW - 2 VIEW COMPARISON:  None Available. FINDINGS: There is no evidence of fracture, dislocation, or joint effusion. Mild degenerative changes are noted at the elbow. Diffuse soft tissue swelling is noted. Vascular calcifications are present in the soft tissues. IMPRESSION: No acute osseous abnormality. Electronically Signed   By: Thornell Sartorius M.D.   On: 03/07/2023 00:33   CT CHEST ABDOMEN PELVIS WO CONTRAST  Result Date: 03/06/2023 CLINICAL DATA:  Altered mental status, possible sepsis. EXAM: CT CHEST, ABDOMEN AND PELVIS WITHOUT CONTRAST TECHNIQUE: Multidetector CT imaging of the chest, abdomen and pelvis was performed following the standard protocol without IV contrast. RADIATION DOSE REDUCTION: This exam was performed according to the departmental dose-optimization program which includes automated exposure control, adjustment of the mA and/or kV according to patient size and/or use of iterative reconstruction technique. COMPARISON:  January 11, 2023. FINDINGS: CT CHEST FINDINGS Cardiovascular: Atherosclerosis of thoracic aorta is noted without aneurysm formation. Normal cardiac size. No pericardial effusion. Mediastinum/Nodes: No enlarged mediastinal, hilar, or axillary lymph nodes. Thyroid gland, trachea, and esophagus demonstrate no significant findings. Lungs/Pleura: Small moderate size bilateral pleural effusions are noted with adjacent subsegmental atelectasis. No pneumothorax.  Musculoskeletal: No chest wall mass or suspicious bone lesions identified. CT ABDOMEN PELVIS FINDINGS Hepatobiliary: No cholelithiasis or biliary dilatation. 11 mm left hepatic low density is again noted. Pancreas: Fatty replacement of the pancreas is noted. Cystic areas seen in pancreatic tail on prior exam are not well visualized currently. Spleen: Normal in size without focal abnormality. Adrenals/Urinary Tract: Adrenal glands and kidneys appear normal. No hydronephrosis or renal obstruction is noted. Urinary bladder is  decompressed. Stomach/Bowel: Stomach is within normal limits. Appendix appears normal. No evidence of bowel wall thickening, distention, or inflammatory changes. Vascular/Lymphatic: Aortic atherosclerosis. No enlarged abdominal or pelvic lymph nodes. Reproductive: Prostate is unremarkable. Other: No abdominal wall hernia or abnormality. No abdominopelvic ascites. Musculoskeletal: No acute or significant osseous findings. IMPRESSION: Small to moderate size bilateral pleural effusions are noted with minimal adjacent subsegmental atelectasis. 11 mm left hepatic low density is noted as described on prior CT scan. This may simply represent cyst or hemangioma, but other pathology cannot be excluded. When the patient is clinically stable and able to follow directions and hold their breath (preferably as an outpatient) further evaluation with dedicated abdominal MRI should be considered. Fatty replacement of the pancreas. Cystic areas seen in pancreatic tail on prior exam are not well visualized currently. Aortic Atherosclerosis (ICD10-I70.0). Electronically Signed   By: Lupita Raider M.D.   On: 03/06/2023 14:21   CT Head Wo Contrast  Result Date: 03/06/2023 CLINICAL DATA:  Mental status change, unknown cause EXAM: CT HEAD WITHOUT CONTRAST TECHNIQUE: Contiguous axial images were obtained from the base of the skull through the vertex without intravenous contrast. RADIATION DOSE REDUCTION: This exam was performed according to the departmental dose-optimization program which includes automated exposure control, adjustment of the mA and/or kV according to patient size and/or use of iterative reconstruction technique. COMPARISON:  None Available. FINDINGS: Brain: There is atrophy and chronic small vessel disease changes. Associated mild ventriculomegaly related to ex vacuo dilatation. No acute intracranial abnormality. Specifically, no hemorrhage, hydrocephalus, mass lesion, acute infarction, or significant intracranial  injury. Vascular: No hyperdense vessel or unexpected calcification. Skull: No acute calvarial abnormality. Sinuses/Orbits: No acute findings Other: No IMPRESSION: Atrophy, chronic microvascular disease. No acute intracranial abnormality. Electronically Signed   By: Charlett Nose M.D.   On: 03/06/2023 13:29        Scheduled Meds:  amLODipine  5 mg Oral q morning   atorvastatin  20 mg Oral q morning   Chlorhexidine Gluconate Cloth  6 each Topical Q0600   darbepoetin (ARANESP) injection - DIALYSIS  60 mcg Subcutaneous Q Wed-1800   feeding supplement (NEPRO CARB STEADY)  237 mL Oral TID BM   insulin aspart  0-5 Units Subcutaneous QHS   insulin aspart  0-9 Units Subcutaneous TID WC   levothyroxine  50 mcg Oral QAC breakfast   metoprolol tartrate  50 mg Oral BID   multivitamin with minerals  1 tablet Oral Daily   pantoprazole  40 mg Oral Daily   sodium chloride flush  3 mL Intravenous Q12H   Continuous Infusions:  sodium chloride     remdesivir 100 mg in sodium chloride 0.9 % 100 mL IVPB 100 mg (03/07/23 1250)   [START ON 03/10/2023] vancomycin     [START ON 03/09/2023] vancomycin       LOS: 2 days    Time spent: 35 minutes    Ugonna Keirsey Hoover Brunette, DO [facility]  If 7PM-7AM, please contact night-coverage [email or web address] 03/08/2023, 11:21 AM

## 2023-03-08 NOTE — Consult Note (Signed)
Virtual Visit via Video Note   I connected with [name] [name]   On 03/08/2023 at 2:35 PM  by Video and verified that I am speaking with the correct person using two identifiers.   I discussed the limitations, risks, security and privacy concerns of performing an evaluation and management service by video .The patient expressed understanding and agreed to proceed.   Location:   Patient: [name] [name] Provider: [facility]    Date of Admission:  03/06/2023   Total days of inpatient antibiotics 2        Reason for Consult: Polymicrobial bacteremia    Principal Problem:   Acute metabolic encephalopathy Active Problems:   Essential hypertension   Neuropathy   Anemia of chronic disease   Hypokalemia   Hypothyroidism   GERD (gastroesophageal reflux disease)   ESRD (end stage renal disease) [facility])   Assessment: 62 year old male admitted with: #MRSA bacteremia and E faecalis bacteremia in HD patient with AV graft #Staph capitis consistent with contamination as it is 1 out of 2 #ESRD on Monday Wednesday Friday HD via AVF status post infiltration, tolerated hemodialysis on 9/24 - Patient had presented with altered mental status.  Noted that he did not have dialysis on Friday due to aVF being infiltrated.  ID engaged with blood cultures grew Staph aureus and E faecalis.  Started on vancomycin - Graft was engaged but has been used for hemodialysis yesterday.  Patient reports that there is no pain or tenderness at the site.  Source of his bacteremia seems unclear but I suspect it is seeded to the graft as such we will get advanced imaging  Recommendations:  -Left arm ultrasound to evaluate graft surrounding inflammation/infection - Follow repeat blood cultures to ensure clearance - Continue vancomycin - CT maxillofacial, patient saw dentistry over a year ago and unclear if he has cavitation. - TTE, will likely need TEE nephrology has been  engaged, noted patient given AVF rest till hemodialysis yesterday.  No reported access issues last treatment. -Given MRSA bacteremia would consider removing AV fistula. Microbiology:   Antibiotics:  Vancomycin Cultures: Blood 9/23 2/2 Staph aureus, 1/2 Staph capitis, 1/2E faecalis 9/24 pending Urine  Other   HPI: [name] is a 62 y.o. male past medical history of ESRD on HD Monday Wednesday Friday, hypertension, diabetes, pulmonary hypertension, cardiomyopathy, GERD, dyslipidemia admitted with MRSA bacteremia.  Patient had presented to the ED with worsening mental status, noted that he due to infiltration was not able to receive hemodialysis on Friday.  On arrival patientHad a temp of 101.7 WBC 3.3K.  BC ID positive for Staph aureus, E faecalis, Staph capitis.  ID engaged.  Urology was engaged as well   Review of Systems: ROS  Past Medical History:  Diagnosis Date   Anemia    Arthritis    Cardiomyopathy [facility])    July 2022   Diabetes mellitus (HCC)    GERD (gastroesophageal reflux disease)    HTN (hypertension)    Hyperlipidemia    Nephrotic syndrome    Obesity    Pulmonary hypertension (HCC)    July 2022 in setting of nephrotic syndrome   Secondary hyperparathyroidism of renal origin (HCC)     Social History   Tobacco Use   Smoking status: Never   Smokeless tobacco: Never  Vaping Use   Vaping status: Never Used  Substance Use Topics   Alcohol use: Never   Drug use: Never  Family History  Problem Relation Age of Onset   Hypertension Mother    Diabetes Mother    Cancer Father        prostate cancer   Hypertension Maternal Grandfather    Rheum arthritis Maternal Grandmother    CAD Neg Hx    Scheduled Meds:  amLODipine  5 mg Oral q morning   atorvastatin  20 mg Oral q morning   Chlorhexidine Gluconate Cloth  6 each Topical Q0600   darbepoetin (ARANESP) injection - DIALYSIS  60 mcg Subcutaneous Q Wed-1800   feeding supplement (NEPRO CARB STEADY)  237  mL Oral TID BM   insulin aspart  0-5 Units Subcutaneous QHS   insulin aspart  0-9 Units Subcutaneous TID WC   [START ON 03/09/2023] levothyroxine  25 mcg Oral QAC breakfast   metoprolol tartrate  50 mg Oral BID   multivitamin with minerals  1 tablet Oral Daily   pantoprazole  40 mg Oral Daily   sodium chloride flush  3 mL Intravenous Q12H   Continuous Infusions:  sodium chloride     remdesivir 100 mg in sodium chloride 0.9 % 100 mL IVPB 100 mg (03/08/23 1411)   [START ON 03/10/2023] vancomycin     [START ON 03/09/2023] vancomycin     PRN Meds:.sodium chloride, acetaminophen **OR** acetaminophen, lidocaine (PF), lidocaine-prilocaine, ondansetron **OR** ondansetron (ZOFRAN) IV, pentafluoroprop-tetrafluoroeth, sodium chloride flush No Known Allergies  OBJECTIVE: Blood pressure 107/64, pulse 64, temperature 97.6 F (36.4 C), temperature source Oral, resp. rate 18, height 5\' 10"  (1.778 m), weight 63.1 kg, SpO2 100%.   Bandage in place over graft  Lab Results Lab Results  Component Value Date   WBC 6.0 03/08/2023   HGB 11.6 (L) 03/08/2023   HCT 37.1 (L) 03/08/2023   MCV 91.2 03/08/2023   PLT 61 (L) 03/08/2023    Lab Results  Component Value Date   CREATININE 4.18 (H) 03/08/2023   BUN 15 03/08/2023   NA 136 03/08/2023   K 3.6 03/08/2023   CL 99 03/08/2023   CO2 25 03/08/2023    Lab Results  Component Value Date   ALT 17 03/06/2023   AST 21 03/06/2023   ALKPHOS 154 (H) 03/06/2023   BILITOT 1.6 (H) 03/06/2023       [name] Earthly, MD [facility] 03/08/2023, 2:37 PM   I have personally spent 82 minutes involved in face-to-face and non-face-to-face activities for this patient on the day of the visit. Professional time spent includes the following activities: Preparing to see the patient (review of tests), Obtaining and/or reviewing separately obtained history (admission/discharge record), Performing a medically appropriate  examination and/or evaluation , Ordering medications/tests/procedures, referring and communicating with other health care professionals, Documenting clinical information in the EMR, Independently interpreting results (not separately reported), Communicating results to the patient/family/caregiver, Counseling and educating the patient/family/caregiver and Care coordination (not separately reported).

## 2023-03-08 NOTE — TOC Progression Note (Signed)
Transition of Care [facility]) - Progression Note    Patient Details  Name: [name] [name] MRN: [id number] Date of Birth: Jul 24, 1960  Transition of Care [facility] CM/SW Contact  Elliot Gault, LCSW Phone Number: 03/08/2023, 3:38 PM  Clinical Narrative:     TOC following. PT recommending SNF. PT says pt could not get up off the bed.   Discussed with pt and his mother. Pt is quite adamant that he will not go to SNF. Pt's mother seems to believe that she can handle it at home. Pt has a cane, RW, BSC, manual wheelchair and electric wheelchair at home for DME. Pt goes to [facility] on [address] on MWF 11:00AM arrival. Pt's mom takes him to HD. She has a ramp at the home for the wheelchair. She states she has been pulling him by the arms to get him up off the bed and into the chair/car. She feels she can do it at home at dc. She thinks he is weak because he hadn't been eating well but now he is eating better in the hospital.  Attempted again to encourage pt to consider SNF at dc. He states it is very unlikely he will change his mind.  TOC can arrange HH and possibly a Hoyer or other type of lift for use at home if pt will agree.   Asked PT to see pt again tomorrow. Will update MD as well.  TOC will follow.  Expected Discharge Plan: Skilled Nursing Facility Barriers to Discharge: Continued Medical Work up  Expected Discharge Plan and Services In-house Referral: Clinical Social Work     Living arrangements for the past 2 months: Single Family Home                                       Social Determinants of Health (SDOH) Interventions SDOH Screenings   Food Insecurity: No Food Insecurity (03/06/2023)  Housing: Low Risk  (03/06/2023)  Transportation Needs: No Transportation Needs (03/06/2023)  Utilities: Not At Risk (03/06/2023)  Depression (PHQ2-9): Low Risk  (09/18/2018)  Tobacco Use: Low Risk  (03/06/2023)    Readmission Risk Interventions    03/07/2023    1:31 PM   Readmission Risk Prevention Plan  Transportation Screening Complete  HRI or Home Care Consult Complete  Palliative Care Screening Not Applicable  Medication Review (RN Care Manager) Complete

## 2023-03-08 NOTE — Plan of Care (Signed)
  Problem: Acute Rehab PT Goals(only PT should resolve) Goal: Pt Will Go Supine/Side To Sit Outcome: Progressing Flowsheets (Taken 03/08/2023 1551) Pt will go Supine/Side to Sit:  with minimal assist  with moderate assist Goal: Patient Will Transfer Sit To/From Stand Outcome: Progressing Flowsheets (Taken 03/08/2023 1551) Patient will transfer sit to/from stand: with moderate assist Goal: Pt Will Transfer Bed To Chair/Chair To Bed Outcome: Progressing Flowsheets (Taken 03/08/2023 1551) Pt will Transfer Bed to Chair/Chair to Bed: with mod assist Goal: Pt Will Ambulate Outcome: Progressing Flowsheets (Taken 03/08/2023 1551) Pt will Ambulate:  15 feet  with moderate assist  with rolling walker   3:51 PM, 03/08/23 Ocie Bob, MPT Physical Therapist with [facility] 336 [phone] office [phone] mobile phone

## 2023-03-08 NOTE — Progress Notes (Signed)
[facility] Progress Note    Assessment/ Plan:   ESRD -outpatient HD orders: [facility]. MWF.  4 hours.  EDW 64 kg.  LUE AVG 15-gauge.  Flow rates: 400/autoflow 1.5.  3K/2.5 calcium.  No heparin.  Meds: Mircera 75 mcg every 2 weeks (last dose 9/11).  Venofer 50 mg q. weekly. -HD on MWF schedule typically, off schedule yesterday, will do off schedule tomorrow and then back on regular schedule Friday - no reported access issues last rx -If issues with AVF, then we will need to arrange for a dialysis catheter   COVID pneumonia -Per primary service, receiving remdesivir   Altered mental status -Possibly related to sepsis, will see if he improves with dialysis   Volume/ hypertension  -UF as tolerated   Anemia of Chronic Kidney Disease -Hemoglobin 10.4. Due for ESA this week -Transfuse PRN for Hgb <7   Secondary Hyperparathyroidism/Hyperphosphatemia -Resume home binders if on any.  Check phosphorus   Hypokalemia -Chronic -caution with repletion   Thrombocytopenia -Workup/management per primary service  Subjective:    Seen in room.  [place] on, a little slow to respond.  HD yesterday, tolerated well.  Off schedule.     Objective:   BP (!) 175/72 (BP Location: Right Arm)   Pulse 63   Temp 97.7 F (36.5 C)   Resp 18   Ht 5\' 10"  (1.778 m)   Wt 63.1 kg   SpO2 99%   BMI 19.96 kg/m   Intake/Output Summary (Last 24 hours) at 03/08/2023 1002 Last data filed at 03/07/2023 1749 Gross per 24 hour  Intake 590 ml  Output 500 ml  Net 90 ml   Weight change: -14.2 kg  Physical Exam: Gen: NAD, lying in bed CVS: RRR Resp: normal wob Abd: soft Ext: no sig edema b/l Les Neuro: awake, alert Dialysis access: LUE AVF +b/t  Imaging: DG Elbow 2 Views Right  Result Date: 03/07/2023 CLINICAL DATA:  Right elbow pain and swelling. EXAM: RIGHT ELBOW - 2 VIEW COMPARISON:  None Available. FINDINGS: There is no evidence of fracture, dislocation, or joint effusion. Mild  degenerative changes are noted at the elbow. Diffuse soft tissue swelling is noted. Vascular calcifications are present in the soft tissues. IMPRESSION: No acute osseous abnormality. Electronically Signed   By: Thornell Sartorius M.D.   On: 03/07/2023 00:33   CT CHEST ABDOMEN PELVIS WO CONTRAST  Result Date: 03/06/2023 CLINICAL DATA:  Altered mental status, possible sepsis. EXAM: CT CHEST, ABDOMEN AND PELVIS WITHOUT CONTRAST TECHNIQUE: Multidetector CT imaging of the chest, abdomen and pelvis was performed following the standard protocol without IV contrast. RADIATION DOSE REDUCTION: This exam was performed according to the departmental dose-optimization program which includes automated exposure control, adjustment of the mA and/or kV according to patient size and/or use of iterative reconstruction technique. COMPARISON:  January 11, 2023. FINDINGS: CT CHEST FINDINGS Cardiovascular: Atherosclerosis of thoracic aorta is noted without aneurysm formation. Normal cardiac size. No pericardial effusion. Mediastinum/Nodes: No enlarged mediastinal, hilar, or axillary lymph nodes. Thyroid gland, trachea, and esophagus demonstrate no significant findings. Lungs/Pleura: Small moderate size bilateral pleural effusions are noted with adjacent subsegmental atelectasis. No pneumothorax. Musculoskeletal: No chest wall mass or suspicious bone lesions identified. CT ABDOMEN PELVIS FINDINGS Hepatobiliary: No cholelithiasis or biliary dilatation. 11 mm left hepatic low density is again noted. Pancreas: Fatty replacement of the pancreas is noted. Cystic areas seen in pancreatic tail on prior exam are not well visualized currently. Spleen: Normal in size without focal abnormality. Adrenals/Urinary Tract: Adrenal  glands and kidneys appear normal. No hydronephrosis or renal obstruction is noted. Urinary bladder is decompressed. Stomach/Bowel: Stomach is within normal limits. Appendix appears normal. No evidence of bowel wall thickening,  distention, or inflammatory changes. Vascular/Lymphatic: Aortic atherosclerosis. No enlarged abdominal or pelvic lymph nodes. Reproductive: Prostate is unremarkable. Other: No abdominal wall hernia or abnormality. No abdominopelvic ascites. Musculoskeletal: No acute or significant osseous findings. IMPRESSION: Small to moderate size bilateral pleural effusions are noted with minimal adjacent subsegmental atelectasis. 11 mm left hepatic low density is noted as described on prior CT scan. This may simply represent cyst or hemangioma, but other pathology cannot be excluded. When the patient is clinically stable and able to follow directions and hold their breath (preferably as an outpatient) further evaluation with dedicated abdominal MRI should be considered. Fatty replacement of the pancreas. Cystic areas seen in pancreatic tail on prior exam are not well visualized currently. Aortic Atherosclerosis (ICD10-I70.0). Electronically Signed   By: Lupita Raider M.D.   On: 03/06/2023 14:21   CT Head Wo Contrast  Result Date: 03/06/2023 CLINICAL DATA:  Mental status change, unknown cause EXAM: CT HEAD WITHOUT CONTRAST TECHNIQUE: Contiguous axial images were obtained from the base of the skull through the vertex without intravenous contrast. RADIATION DOSE REDUCTION: This exam was performed according to the departmental dose-optimization program which includes automated exposure control, adjustment of the mA and/or kV according to patient size and/or use of iterative reconstruction technique. COMPARISON:  None Available. FINDINGS: Brain: There is atrophy and chronic small vessel disease changes. Associated mild ventriculomegaly related to ex vacuo dilatation. No acute intracranial abnormality. Specifically, no hemorrhage, hydrocephalus, mass lesion, acute infarction, or significant intracranial injury. Vascular: No hyperdense vessel or unexpected calcification. Skull: No acute calvarial abnormality. Sinuses/Orbits: No  acute findings Other: No IMPRESSION: Atrophy, chronic microvascular disease. No acute intracranial abnormality. Electronically Signed   By: Charlett Nose M.D.   On: 03/06/2023 13:29   DG Chest Port 1 View  Result Date: 03/06/2023 CLINICAL DATA:  Altered mental status EXAM: PORTABLE CHEST - 1 VIEW COMPARISON:  12/27/2022 FINDINGS: Cardiomediastinal silhouette and pulmonary vasculature are within normal limits. Interval removal of tunneled right IJ hemodialysis catheter. Mild patchy opacities at the lung bases are new since prior study. IMPRESSION: New mild patchy lung base opacities could be related to atelectasis or pneumonia. Electronically Signed   By: Acquanetta Belling M.D.   On: 03/06/2023 11:41    Labs: BMET Recent Labs  Lab 03/06/23 1017 03/06/23 1026 03/07/23 0437 03/08/23 0542  NA 138 137 137 136  K 3.2* 3.0* 2.8* 3.6  CL 100 96* 100 99  CO2  --  27 26 25   GLUCOSE 99 99 111* 87  BUN 20 18 25* 15  CREATININE 6.90* 5.98* 6.81* 4.18*  CALCIUM  --  7.9* 7.2* 7.2*   CBC Recent Labs  Lab 03/06/23 1017 03/06/23 1026 03/07/23 0437 03/08/23 0542  WBC  --  5.3 3.3* 6.0  NEUTROABS  --  4.0  --   --   HGB 14.3 13.4 10.4* 11.6*  HCT 42.0 41.9 34.1* 37.1*  MCV  --  89.5 90.7 91.2  PLT  --  99* 76* 61*    Medications:     amLODipine  5 mg Oral q morning   atorvastatin  20 mg Oral q morning   Chlorhexidine Gluconate Cloth  6 each Topical Q0600   darbepoetin (ARANESP) injection - DIALYSIS  60 mcg Subcutaneous Q Wed-1800   feeding supplement (NEPRO CARB STEADY)  237 mL Oral TID BM   insulin aspart  0-5 Units Subcutaneous QHS   insulin aspart  0-9 Units Subcutaneous TID WC   levothyroxine  50 mcg Oral QAC breakfast   metoprolol tartrate  50 mg Oral BID   multivitamin with minerals  1 tablet Oral Daily   pantoprazole  40 mg Oral Daily   sodium chloride flush  3 mL Intravenous Q12H      Bufford Buttner MD [facility] 03/08/2023, 10:02 AM

## 2023-03-08 NOTE — Plan of Care (Signed)
Problem: Education: Goal: Knowledge of General Education information will improve Description: Including pain rating scale, medication(s)/side effects and non-pharmacologic comfort measures Outcome: Progressing   Problem: Health Behavior/Discharge Planning: Goal: Ability to manage health-related needs will improve Outcome: Progressing   Problem: Clinical Measurements: Goal: Ability to maintain clinical measurements within normal limits will improve Outcome: Progressing Goal: Will remain free from infection Outcome: Progressing Goal: Diagnostic test results will improve Outcome: Progressing Goal: Respiratory complications will improve Outcome: Progressing Goal: Cardiovascular complication will be avoided Outcome: Progressing   Problem: Activity: Goal: Risk for activity intolerance will decrease Outcome: Progressing   Problem: Nutrition: Goal: Adequate nutrition will be maintained Outcome: Progressing   Problem: Coping: Goal: Level of anxiety will decrease Outcome: Progressing   Problem: Elimination: Goal: Will not experience complications related to bowel motility Outcome: Progressing Goal: Will not experience complications related to urinary retention Outcome: Progressing   Problem: Pain Managment: Goal: General experience of comfort will improve Outcome: Progressing   Problem: Safety: Goal: Ability to remain free from injury will improve Outcome: Progressing   Problem: Skin Integrity: Goal: Risk for impaired skin integrity will decrease Outcome: Progressing   Problem: Education: Goal: Knowledge of risk factors and measures for prevention of condition will improve Outcome: Progressing   Problem: Coping: Goal: Psychosocial and spiritual needs will be supported Outcome: Progressing   Problem: Respiratory: Goal: Will maintain a patent airway Outcome: Progressing Goal: Complications related to the disease process, condition or treatment will be avoided or  minimized Outcome: Progressing   Problem: Education: Goal: Ability to describe self-care measures that may prevent or decrease complications (Diabetes Survival Skills Education) will improve Outcome: Progressing Goal: Individualized Educational Video(s) Outcome: Progressing   Problem: Coping: Goal: Ability to adjust to condition or change in health will improve Outcome: Progressing   Problem: Fluid Volume: Goal: Ability to maintain a balanced intake and output will improve Outcome: Progressing   Problem: Health Behavior/Discharge Planning: Goal: Ability to identify and utilize available resources and services will improve Outcome: Progressing Goal: Ability to manage health-related needs will improve Outcome: Progressing   Problem: Metabolic: Goal: Ability to maintain appropriate glucose levels will improve Outcome: Progressing   Problem: Nutritional: Goal: Maintenance of adequate nutrition will improve Outcome: Progressing Goal: Progress toward achieving an optimal weight will improve Outcome: Progressing   Problem: Skin Integrity: Goal: Risk for impaired skin integrity will decrease Outcome: Progressing   Problem: Tissue Perfusion: Goal: Adequacy of tissue perfusion will improve Outcome: Progressing

## 2023-03-08 NOTE — Progress Notes (Signed)
*  PRELIMINARY RESULTS* Echocardiogram 2D Echocardiogram has not been performed. Patient going to radiology for procedure.  Stacey Drain 03/08/2023, 4:24 PM

## 2023-03-08 NOTE — Evaluation (Signed)
Physical Therapy Evaluation Patient Details Name: [name] [name] MRN: [id number] DOB: October 06, 1960 Today's Date: 03/08/2023  History of Present Illness  [name] [name] is a 62 y.o. male with medical history significant for ESRD on HD MWF, hypertension, type 2 diabetes, pulmonary hypertension, cardiomyopathy, GERD, dyslipidemia, and anemia of CKD who presented to the ED with worsening mental status.  His last hemodialysis was his last hemodialysis was Wednesday of last week and he apparently had what sounds like infiltration of his fistula this past Friday and therefore was not able to receive hemodialysis.  It sounds like he went to vascular surgery access center who stated not to use his fistula and he was supposed to have a make-up session of hemodialysis on Saturday, but was too well.  He was noted to have some fever as well as some shortness of breath.  He is now noted to have worsening overall symptoms as well as confusion.   Clinical Impression  Patient demonstrates slow labored movement for sitting up at bedside with c/o severe pain in bilateral knees with movement.  Patient unable to fully stand due to pain in knees and BLE weakness and required Mod/max assist to reposition when put back to bed.  Patient will benefit from continued skilled physical therapy in hospital and recommended venue below to increase strength, balance, endurance for safe ADLs and gait.          If plan is discharge home, recommend the following: A lot of help with bathing/dressing/bathroom;A lot of help with walking and/or transfers;Help with stairs or ramp for entrance;Assistance with cooking/housework   Can travel by private vehicle   No    Equipment Recommendations    Recommendations for Other Services       Functional Status Assessment Patient has had a recent decline in their functional status and demonstrates the ability to make significant improvements in function in a reasonable and predictable amount of  time.     Precautions / Restrictions Precautions Precautions: Fall Restrictions Weight Bearing Restrictions: No      Mobility  Bed Mobility Overal bed mobility: Needs Assistance Bed Mobility: Supine to Sit, Sit to Supine     Supine to sit: Max assist Sit to supine: Mod assist, Max assist   General bed mobility comments: slow labored  movement with poor tolerance for moving BLE due to pain    Transfers Overall transfer level: Needs assistance Equipment used: 1 person hand held assist Transfers: Sit to/from Stand Sit to Stand: Max assist           General transfer comment: Max assist for partial standing with left hand holding onto bed rail, unable to lock knees due to weakness    Ambulation/Gait                  Stairs            Wheelchair Mobility     Tilt Bed    Modified Rankin (Stroke Patients Only)       Balance Overall balance assessment: Needs assistance Sitting-balance support: Feet supported, No upper extremity supported Sitting balance-Leahy Scale: Fair Sitting balance - Comments: seated at EOB   Standing balance support: During functional activity, Single extremity supported Standing balance-Leahy Scale: Zero Standing balance comment: poor/zero using holding onto bed rail using left hand                             Pertinent Vitals/Pain Pain Assessment Pain Assessment:  Faces Faces Pain Scale: Hurts even more Pain Location: bilateral knees due to fall Pain Descriptors / Indicators: Sore, Guarding, Grimacing, Moaning, Sharp Pain Intervention(s): Limited activity within patient's tolerance, Monitored during session, Repositioned    Home Living Family/patient expects to be discharged to:: Private residence Living Arrangements: Parent Available Help at Discharge: Family;Available 24 hours/day Type of Home: House Home Access: Stairs to enter   [employer] of Steps: 1 step in back   Home Layout: One  level Home Equipment: Cane - single point;Wheelchair - manual;Wheelchair - Surveyor, quantity (2 wheels);Shower seat Additional Comments: patient's mother lives next door and patient stays with her most of time, 5-7 steps to enter patient's home    Prior Function Prior Level of Function : Needs assist       Physical Assist : Mobility (physical);ADLs (physical) Mobility (physical): Bed mobility;Transfers;Stairs;Gait   Mobility Comments: short household distance using cane and hand held assist, assisted transfers to w/c and uses w/c for longer distances, has not walked in about 2 weeks ADLs Comments: Assisted by family     Extremity/Trunk Assessment   Upper Extremity Assessment Upper Extremity Assessment: Defer to OT evaluation    Lower Extremity Assessment Lower Extremity Assessment: Generalized weakness    Cervical / Trunk Assessment Cervical / Trunk Assessment: Kyphotic  Communication   Communication Communication: No apparent difficulties  Cognition Arousal: Alert Behavior During Therapy: WFL for tasks assessed/performed, Anxious Overall Cognitive Status: Within Functional Limits for tasks assessed                                 General Comments: very apprehensive due to pain in knees and RUE at IV insertion site        General Comments      Exercises     Assessment/Plan    PT Assessment Patient needs continued PT services  PT Problem List Decreased strength;Decreased activity tolerance;Decreased balance;Decreased mobility;Decreased range of motion       PT Treatment Interventions DME instruction;Gait training;Functional mobility training;Therapeutic activities;Therapeutic exercise;Stair training;Balance training;Patient/family education    PT Goals (Current goals can be found in the Care Plan section)  Acute Rehab PT Goals Patient Stated Goal: return home with family to assist PT Goal Formulation: With patient/family Time For Goal  Achievement: 03/22/23 Potential to Achieve Goals: Good    Frequency Min 3X/week     Co-evaluation               AM-PAC PT "6 Clicks" Mobility  Outcome Measure Help needed turning from your back to your side while in a flat bed without using bedrails?: A Lot Help needed moving from lying on your back to sitting on the side of a flat bed without using bedrails?: A Lot Help needed moving to and from a bed to a chair (including a wheelchair)?: Total Help needed standing up from a chair using your arms (e.g., wheelchair or bedside chair)?: A Lot Help needed to walk in hospital room?: Total Help needed climbing 3-5 steps with a railing? : Total 6 Click Score: 9    End of Session   Activity Tolerance: Patient tolerated treatment well;Patient limited by fatigue;Patient limited by pain Patient left: in bed;with call bell/phone within reach;with family/visitor present Nurse Communication: Mobility status PT Visit Diagnosis: Unsteadiness on feet (R26.81);Other abnormalities of gait and mobility (R26.89);Muscle weakness (generalized) (M62.81)    Time: 1445-1510 PT Time Calculation (min) (ACUTE ONLY): 25 min   Charges:  PT Evaluation $PT Eval Moderate Complexity: 1 Mod PT Treatments $Therapeutic Activity: 23-37 mins PT General Charges $$ ACUTE PT VISIT: 1 Visit         3:49 PM, 03/08/23 Ocie Bob, MPT Physical Therapist with [facility] [phone] office [phone] mobile phone

## 2023-03-09 ENCOUNTER — Inpatient Hospital Stay (HOSPITAL_COMMUNITY): Payer: Medicare HMO

## 2023-03-09 ENCOUNTER — Other Ambulatory Visit (HOSPITAL_COMMUNITY): Payer: Self-pay | Admitting: *Deleted

## 2023-03-09 DIAGNOSIS — G9341 Metabolic encephalopathy: Secondary | ICD-10-CM | POA: Diagnosis not present

## 2023-03-09 LAB — CULTURE, BLOOD (ROUTINE X 2): Special Requests: ADEQUATE

## 2023-03-09 LAB — BASIC METABOLIC PANEL
Anion gap: 15 (ref 5–15)
BUN: 24 mg/dL — ABNORMAL HIGH (ref 8–23)
CO2: 26 mmol/L (ref 22–32)
Calcium: 7.1 mg/dL — ABNORMAL LOW (ref 8.9–10.3)
Chloride: 94 mmol/L — ABNORMAL LOW (ref 98–111)
Creatinine, Ser: 5.28 mg/dL — ABNORMAL HIGH (ref 0.61–1.24)
GFR, Estimated: 11 mL/min — ABNORMAL LOW (ref >60)
Glucose, Bld: 148 mg/dL — ABNORMAL HIGH (ref 70–99)
Potassium: 3.2 mmol/L — ABNORMAL LOW (ref 3.5–5.1)
Sodium: 135 mmol/L (ref 135–145)

## 2023-03-09 LAB — CBC
HCT: 37 % — ABNORMAL LOW (ref 39.0–52.0)
Hemoglobin: 11.8 g/dL — ABNORMAL LOW (ref 13.0–17.0)
MCH: 28 pg (ref 26.0–34.0)
MCHC: 31.9 g/dL (ref 30.0–36.0)
MCV: 87.7 fL (ref 80.0–100.0)
Platelets: 86 10*3/uL — ABNORMAL LOW (ref 150–400)
RBC: 4.22 MIL/uL (ref 4.22–5.81)
RDW: 14.6 % (ref 11.5–15.5)
WBC: 6.6 10*3/uL (ref 4.0–10.5)
nRBC: 0 % (ref 0.0–0.2)

## 2023-03-09 LAB — ECHOCARDIOGRAM COMPLETE
Height: 70 in
Weight: 2225.76 oz

## 2023-03-09 LAB — GLUCOSE, CAPILLARY
Glucose-Capillary: 114 mg/dL — ABNORMAL HIGH (ref 70–99)
Glucose-Capillary: 137 mg/dL — ABNORMAL HIGH (ref 70–99)
Glucose-Capillary: 140 mg/dL — ABNORMAL HIGH (ref 70–99)
Glucose-Capillary: 199 mg/dL — ABNORMAL HIGH (ref 70–99)

## 2023-03-09 LAB — MAGNESIUM: Magnesium: 1.7 mg/dL (ref 1.7–2.4)

## 2023-03-09 MED ORDER — VANCOMYCIN HCL 750 MG/150ML IV SOLN
750.0000 mg | INTRAVENOUS | Status: DC
  Administered 2023-03-10 – 2023-03-29 (×6): 750 mg via INTRAVENOUS
  Filled 2023-03-09 (×14): qty 150

## 2023-03-09 NOTE — Progress Notes (Signed)
Pt arrived to 70M, bed 20 at approx 1530 via ambulance.  Covid precautions in place.  Pt settled into the room, VS obtained, assessment completed by this RN.  Pt placed on tele, skin check done with second RN.  Pt's belongings at the bedside, including eyeglasses and medications in a pill box.  Medications sent to pharmacy, pt aware that meds will be returned when he discharged.  Pt's mother Doristine Counter called by this RN with an update re: pt tx to [facility], POC.  Dr. Jerral Ralph messaged that pt has arrived. Vascular at the bedside as well.  Bed alarm on, call bell and phone within reach.  Dinner ordered.  Pt has no complaints at this time.  WCTM

## 2023-03-09 NOTE — Care Management Important Message (Signed)
Important Message  Patient Details  Name: [name] [name] MRN: [id number] Date of Birth: 1960/08/13   Important Message Given:  N/A - LOS <3 / Initial given by admissions     Corey Harold 03/09/2023, 11:35 AM

## 2023-03-09 NOTE — Progress Notes (Signed)
ABX paused until patient receives new PIV insertion. Attempts made to reinsert. No success. IV team consulted. Previous IV was leaking IVF.

## 2023-03-09 NOTE — Progress Notes (Signed)
Patient refused echo at this time. Patient said he needed a break and needed to make a phone call.                                       Celesta Gentile, RCS

## 2023-03-09 NOTE — Progress Notes (Signed)
Pt receives out-pt HD at [facility] on MWF. Pt arrives at 10:40 am for 11:00 am chair time. Will assist as needed.   Olivia Canter Renal Navigator [phone]

## 2023-03-09 NOTE — Progress Notes (Signed)
[facility] Progress Note    Assessment/ Plan:   ESRD -outpatient HD orders: [facility]. MWF.  4 hours.  EDW 64 kg.  LUE AVG 15-gauge.  Flow rates: 400/autoflow 1.5.  3K/2.5 calcium.  No heparin.  Meds: Mircera 75 mcg every 2 weeks (last dose 9/11).  Venofer 50 mg q. weekly. -HD on MWF schedule typically, off schedule yesterday, will do off schedule today and then back on regular schedule Friday - no reported access issues last rx    COVID pneumonia -Per primary service, receiving remdesivir   Altered mental status - MRSA and E faecalis bacteremia - on vanc - ID following- [place] of AVG nonspecific - if high index of suspicion may need excision which would require transfer to [facility]   Volume/ hypertension  -UF as tolerated   Anemia of Chronic Kidney Disease -Hemoglobin 10.4. Due for ESA this week -Transfuse PRN for Hgb <7   Secondary Hyperparathyroidism/Hyperphosphatemia -Resume home binders if on any.  Check phosphorus   Hypokalemia -Chronic -caution with repletion   Thrombocytopenia -Workup/management per primary service  Subjective:    For HD today.  Has MRSA and E faecalis bacteremia.  Duplex of access showing patent vasculature, nonspecific edema.  For TEE.     Objective:   BP (!) 179/68 (BP Location: Right Arm)   Pulse 61   Temp 98.2 F (36.8 C) (Oral)   Resp 18   Ht 5\' 10"  (1.778 m)   Wt 63.1 kg   SpO2 100%   BMI 19.96 kg/m   Intake/Output Summary (Last 24 hours) at 03/09/2023 1018 Last data filed at 03/09/2023 0800 Gross per 24 hour  Intake 360 ml  Output --  Net 360 ml   Weight change:   Physical Exam: Gen: NAD, lying in bed CVS: RRR Resp: normal wob Abd: soft Ext: no sig edema b/l Les Neuro: awake, alert Dialysis access: LUE AVF +b/t  Imaging: CT MAXILLOFACIAL WO CONTRAST  Result Date: 03/08/2023 CLINICAL DATA:  Abscess, MRSA bacteremia and E fascia talus bacteremia and hemodialysis patient with AV graft. Possible cavitation, saw  a dentist ovary year ago. EXAM: CT MAXILLOFACIAL WITHOUT CONTRAST TECHNIQUE: Multidetector CT imaging of the maxillofacial structures was performed. Multiplanar CT image reconstructions were also generated. RADIATION DOSE REDUCTION: This exam was performed according to the departmental dose-optimization program which includes automated exposure control, adjustment of the mA and/or kV according to patient size and/or use of iterative reconstruction technique. COMPARISON:  03/06/2023. FINDINGS: Osseous: No acute fracture. There is partial opacification of the mastoid air cells bilaterally, unchanged from the previous exam. Poor dentition is noted with multiple cavitations and periapical lucencies involving the mandible and maxilla Orbits: No traumatic or inflammatory finding. Sinuses: Mucosal thickening is present in the maxillary sinuses bilaterally in the sphenoid sinus on the left. There is partial opacification of the ethmoid air cells on the left. No air-fluid levels are seen. Soft tissues: No acute abnormality. No focal fluid collection is seen to suggest abscess. Examination for infection is limited due to lack of IV contrast. Limited intracranial: Diffuse cerebral atrophy is noted. Periventricular white matter hypodensities are present bilaterally. No hydrocephalus. IMPRESSION: 1. No focal fluid collection to suggest abscess. 2. Poor dentition with multiple cavitations and periapical lucencies. 3. Chronic sinus disease. Electronically Signed   By: Thornell Sartorius M.D.   On: 03/08/2023 20:26   [place] LT UPPER EXTREM LTD SOFT TISSUE NON VASCULAR  Result Date: 03/08/2023 CLINICAL DATA:  Infection suspected.  Hemodialysis patient. EXAM: ULTRASOUND LEFT  UPPER EXTREMITY LIMITED TECHNIQUE: Ultrasound examination of the upper extremity soft tissues was performed in the area of clinical concern. COMPARISON:  None of the LEFT upper extremity. Patient had RIGHT elbow radiographs done 03/06/2023. Chest CT 03/06/2023.  FINDINGS: Examination of the left upper arm was performed targeted to the patient's hemodialysis graft. The graft is patent without evidence of thrombosis or surrounding focal fluid collection. Mild surrounding soft tissue edema noted without hypervascularity on Doppler ultrasound. IMPRESSION: 1. Mild nonspecific edema in the left upper arm. 2. No focal fluid collection to suggest abscess. 3. Patent hemodialysis vascular shunt. Electronically Signed   By: Carey Bullocks M.D.   On: 03/08/2023 19:04    Labs: BMET Recent Labs  Lab 03/06/23 1017 03/06/23 1026 03/07/23 0437 03/08/23 0542  NA 138 137 137 136  K 3.2* 3.0* 2.8* 3.6  CL 100 96* 100 99  CO2  --  27 26 25   GLUCOSE 99 99 111* 87  BUN 20 18 25* 15  CREATININE 6.90* 5.98* 6.81* 4.18*  CALCIUM  --  7.9* 7.2* 7.2*   CBC Recent Labs  Lab 03/06/23 1017 03/06/23 1026 03/07/23 0437 03/08/23 0542  WBC  --  5.3 3.3* 6.0  NEUTROABS  --  4.0  --   --   HGB 14.3 13.4 10.4* 11.6*  HCT 42.0 41.9 34.1* 37.1*  MCV  --  89.5 90.7 91.2  PLT  --  99* 76* 61*    Medications:     amLODipine  5 mg Oral q morning   atorvastatin  20 mg Oral q morning   Chlorhexidine Gluconate Cloth  6 each Topical Q0600   darbepoetin (ARANESP) injection - DIALYSIS  60 mcg Subcutaneous Q Wed-1800   feeding supplement (NEPRO CARB STEADY)  237 mL Oral TID BM   insulin aspart  0-5 Units Subcutaneous QHS   insulin aspart  0-9 Units Subcutaneous TID WC   levothyroxine  25 mcg Oral QAC breakfast   metoprolol tartrate  50 mg Oral BID   multivitamin with minerals  1 tablet Oral Daily   pantoprazole  40 mg Oral Daily   sodium chloride flush  3 mL Intravenous Q12H      Bufford Buttner MD [facility] 03/09/2023, 10:18 AM

## 2023-03-09 NOTE — Progress Notes (Signed)
PROGRESS NOTE    [name] [name]  ZOX:[id number] DOB: 1960-08-10 DOA: 03/06/2023 PCP: Farris Has, MD   Brief Narrative:    [name] [name] is a 62 y.o. male with medical history significant for ESRD on HD MWF, hypertension, type 2 diabetes, pulmonary hypertension, cardiomyopathy, GERD, dyslipidemia, and anemia of CKD who presented to the ED with worsening mental status.  He was admitted with acute metabolic encephalopathy secondary to UTI/COVID-pneumonia as well as missed hemodialysis sessions.  Patient is now noted to have MRSA and enterococcal bacteremia and has been started on vancomycin.  Appreciate ID evaluation with recommendations for transfer to [facility] for fistula removal.  Vascular surgery notified and will assess upon arrival.  Plan is for hemodialysis today.  Assessment & Plan:   Principal Problem:   Acute metabolic encephalopathy Active Problems:   Essential hypertension   Neuropathy   Anemia of chronic disease   Hypokalemia   Hypothyroidism   GERD (gastroesophageal reflux disease)   ESRD (end stage renal disease) (HCC)  Assessment and Plan:   Acute metabolic encephalopathy (resolved) likely secondary to UTI/COVID and missed HD, now with Enterococcus and MRSA bacteremia -Plan for hemodialysis today and nephrology to continue to follow at [facility] -PT/OT recommending SNF   COVID infection -Remdesivir course completed -Has not required steroids -Continue isolation precautions  Enterococcus and MRSA bacteremia -Started on vancomycin  -No significant findings on ultrasound of fistula or CT maxillofacial -Appreciate ID evaluation with recommendations to transfer to [facility] for removal of fistula.  Discussed case with Dr. Lenell Antu who will see patient upon arrival.   ESRD on HD MWF -Plan for further hemodialysis today off schedule   Anemia of chronic kidney disease-stable -Currently stable, hold ESA for now -Transfuse for hemoglobin less than 7    Hypertension-stable -Continue home blood pressure medications   Type 2 diabetes -Currently without hyperglycemia -Maintain on SSI every 4 hours -Carb modified diet -Avoid gabapentin for neuropathy given current altered mentation   Hypothyroidism -Continue levothyroxine and TSH 0.588 with T4 1.47 -Levothyroxine dose decreased to 25 mcg daily   GERD -PPI   Thrombocytopenia-downward trending -Hold heparin agents -Monitor CBC   DVT prophylaxis: SCDs Code Status: Full Family Communication: None at bedside, tried calling mother with no response Disposition Plan: Plan for hemodialysis, continue IV medications, transfer to [facility] for Vascular evaluation Status is: Inpatient Remains inpatient appropriate because: Need for IV medications  Consultants:  Nephrology ID Vascular-Dr. Lenell Antu  Procedures:  None  Antimicrobials:  Anti-infectives (From admission, onward)    Start     Dose/Rate Route Frequency Ordered Stop   03/10/23 1600  vancomycin (VANCOREADY) IVPB 750 mg/150 mL  Status:  Discontinued        750 mg 150 mL/hr over 60 Minutes Intravenous Every M-W-F (Hemodialysis) 03/08/23 1106 03/09/23 0850   03/10/23 1600  vancomycin (VANCOREADY) IVPB 750 mg/150 mL        750 mg 150 mL/hr over 60 Minutes Intravenous Every M-W-F (Hemodialysis) 03/09/23 1037     03/09/23 1600  vancomycin (VANCOREADY) IVPB 750 mg/150 mL        750 mg 150 mL/hr over 60 Minutes Intravenous Every T-Th-Sa (Hemodialysis) 03/08/23 1106 03/11/23 1559   03/08/23 1200  vancomycin (VANCOREADY) IVPB 750 mg/150 mL  Status:  Discontinued        750 mg 150 mL/hr over 60 Minutes Intravenous Every M-W-F (Hemodialysis) 03/07/23 1051 03/08/23 1106   03/07/23 1145  vancomycin (VANCOREADY) IVPB 1750 mg/350 mL  1,750 mg 175 mL/hr over 120 Minutes Intravenous  Once 03/07/23 1051 03/07/23 1617   03/07/23 1000  remdesivir 100 mg in sodium chloride 0.9 % 100 mL IVPB  Status:  Discontinued       Placed in "Followed by"  Linked Group   100 mg 200 mL/hr over 30 Minutes Intravenous Daily 03/06/23 1638 03/06/23 1645   03/07/23 1000  remdesivir 100 mg in sodium chloride 0.9 % 100 mL IVPB       Placed in "Followed by" Linked Group   100 mg 200 mL/hr over 30 Minutes Intravenous Daily 03/06/23 1645 03/08/23 1441   03/06/23 1730  remdesivir 100 mg in sodium chloride 0.9 % 100 mL IVPB       Placed in "Followed by" Linked Group   100 mg 200 mL/hr over 30 Minutes Intravenous Every 30 min 03/06/23 1645 03/06/23 1909   03/06/23 1638  remdesivir 200 mg in sodium chloride 0.9% 250 mL IVPB  Status:  Discontinued       Placed in "Followed by" Linked Group   200 mg 580 mL/hr over 30 Minutes Intravenous Once 03/06/23 1638 03/06/23 1645   03/06/23 1500  cefTRIAXone (ROCEPHIN) 1 g in sodium chloride 0.9 % 100 mL IVPB  Status:  Discontinued        1 g 200 mL/hr over 30 Minutes Intravenous Every 24 hours 03/06/23 1440 03/07/23 1429      Subjective: Patient seen and evaluated today with no new acute complaints or concerns. No acute concerns or events noted overnight.  Objective: Vitals:   03/08/23 1333 03/08/23 1412 03/08/23 2126 03/09/23 0520  BP: 125/72 107/64 (!) 164/71 (!) 179/68  Pulse: 70 64 68 61  Resp: 18     Temp:   98 F (36.7 C) 98.2 F (36.8 C)  TempSrc:   Oral Oral  SpO2: 100%  99% 100%  Weight:      Height:        Intake/Output Summary (Last 24 hours) at 03/09/2023 1218 Last data filed at 03/09/2023 0800 Gross per 24 hour  Intake 240 ml  Output --  Net 240 ml   Filed Weights   03/06/23 0947 03/07/23 1140 03/07/23 1600  Weight: 78 kg 63.8 kg 63.1 kg    Examination:  General exam: Appears calm and comfortable  Respiratory system: Clear to auscultation. Respiratory effort normal. Cardiovascular system: S1 & S2 heard, RRR.  Gastrointestinal system: Abdomen is soft Central nervous system: Alert and awake Extremities: No edema, left upper extremity fistula with no erythema or drainage  noted Skin: No significant lesions noted Psychiatry: Flat affect.    Data Reviewed: I have personally reviewed following labs and imaging studies  CBC: Recent Labs  Lab 03/06/23 1017 03/06/23 1026 03/07/23 0437 03/08/23 0542  WBC  --  5.3 3.3* 6.0  NEUTROABS  --  4.0  --   --   HGB 14.3 13.4 10.4* 11.6*  HCT 42.0 41.9 34.1* 37.1*  MCV  --  89.5 90.7 91.2  PLT  --  99* 76* 61*   Basic Metabolic Panel: Recent Labs  Lab 03/06/23 1017 03/06/23 1026 03/07/23 0437 03/08/23 0542  NA 138 137 137 136  K 3.2* 3.0* 2.8* 3.6  CL 100 96* 100 99  CO2  --  27 26 25   GLUCOSE 99 99 111* 87  BUN 20 18 25* 15  CREATININE 6.90* 5.98* 6.81* 4.18*  CALCIUM  --  7.9* 7.2* 7.2*  MG  --   --  1.8  1.8   GFR: Estimated Creatinine Clearance: 15.9 mL/min (A) (by C-G formula based on SCr of 4.18 mg/dL (H)). Liver Function Tests: Recent Labs  Lab 03/06/23 1026  AST 21  ALT 17  ALKPHOS 154*  BILITOT 1.6*  PROT 6.5  ALBUMIN 2.6*   No results for input(s): "LIPASE", "AMYLASE" in the last 168 hours. Recent Labs  Lab 03/06/23 1026  AMMONIA 15   Coagulation Profile: Recent Labs  Lab 03/06/23 1026  INR 1.1   Cardiac Enzymes: No results for input(s): "CKTOTAL", "CKMB", "CKMBINDEX", "TROPONINI" in the last 168 hours. BNP (last 3 results) No results for input(s): "PROBNP" in the last 8760 hours. HbA1C: No results for input(s): "HGBA1C" in the last 72 hours. CBG: Recent Labs  Lab 03/08/23 1106 03/08/23 1627 03/08/23 2125 03/09/23 0740 03/09/23 1146  GLUCAP 92 129* 158* 114* 137*   Lipid Profile: No results for input(s): "CHOL", "HDL", "LDLCALC", "TRIG", "CHOLHDL", "LDLDIRECT" in the last 72 hours. Thyroid Function Tests: Recent Labs    03/07/23 0435 03/07/23 0436  TSH  --  0.588  FREET4 1.47*  --    Anemia Panel: No results for input(s): "VITAMINB12", "FOLATE", "FERRITIN", "TIBC", "IRON", "RETICCTPCT" in the last 72 hours. Sepsis Labs: Recent Labs  Lab  03/06/23 1026  PROCALCITON 2.13  LATICACIDVEN 1.2    Recent Results (from the past 240 hour(s))  SARS Coronavirus 2 by RT PCR (hospital order, performed in [facility] hospital lab) *cepheid single result test* Anterior Nasal Swab     Status: Abnormal   Collection Time: 03/06/23 10:36 AM   Specimen: Anterior Nasal Swab  Result Value Ref Range Status   SARS Coronavirus 2 by RT PCR POSITIVE (A) NEGATIVE Final    Comment: (NOTE) SARS-CoV-2 target nucleic acids are DETECTED  SARS-CoV-2 RNA is generally detectable in upper respiratory specimens  during the acute phase of infection.  Positive results are indicative  of the presence of the identified virus, but do not rule out bacterial infection or co-infection with other pathogens not detected by the test.  Clinical correlation with patient history and  other diagnostic information is necessary to determine patient infection status.  The expected result is negative.  Fact Sheet for Patients:   RoadLapTop.co.za   Fact Sheet for Healthcare Providers:   http://kim-miller.com/    This test is not yet approved or cleared by the [place] FDA and  has been authorized for detection and/or diagnosis of SARS-CoV-2 by FDA under an Emergency Use Authorization (EUA).  This EUA will remain in effect (meaning this test can be used) for the duration of  the COVID-19 declaration under Section 564(b)(1)  of the Act, 21 U.S.C. section 360-bbb-3(b)(1), unless the authorization is terminated or revoked sooner.   Performed at [facility], [address], [place], [place] [address]   Culture, blood (routine x 2)     Status: Abnormal (Preliminary result)   Collection Time: 03/06/23 11:35 AM   Specimen: BLOOD  Result Value Ref Range Status   Specimen Description   Final    BLOOD BLOOD RIGHT ARM Performed at [facility], [address] [place], [place] [address]    Special Requests   Final     BOTTLES DRAWN AEROBIC ONLY Blood Culture results may not be optimal due to an inadequate volume of blood received in culture bottles Performed at [facility], [address], [place], [place] [address]    Culture  Setup Time   Final    GRAM POSITIVE COCCI AEROBIC BOTTLE  ONLY Gram Stain Report Called to,Read Back By and Verified With: DILDY,V. ON 03/07/2023 AT 1140 BY FRATTO,A. Performed at [facility], [address] [place], [place] [address]    Culture STAPHYLOCOCCUS AUREUS STAPHYLOCOCCUS CAPITIS  (A)  Final   Report Status PENDING  Incomplete  Culture, blood (routine x 2)     Status: Abnormal (Preliminary result)   Collection Time: 03/06/23 11:35 AM   Specimen: BLOOD  Result Value Ref Range Status   Specimen Description   Final    BLOOD BLOOD LEFT HAND Performed at [facility], [address] [place], [place] [address]    Special Requests   Final    BOTTLES DRAWN AEROBIC ONLY Blood Culture adequate volume Performed at [facility], [address], [place], [place] [address]    Culture  Setup Time   Final    GRAM POSITIVE COCCI AEROBIC BOTTLE ONLY CRITICAL RESULT CALLED TO, READ BACK BY AND VERIFIED WITH: [facility] WEAVER @ 2951 ON 03/07/23 C VARNER CRITICAL RESULT CALLED TO, READ BACK BY AND VERIFIED WITH: PHARMD J MENDENHALL [id number] AT 1019 BY CM    Culture (A)  Final    STAPHYLOCOCCUS AUREUS ENTEROCOCCUS FAECALIS SUSCEPTIBILITIES TO FOLLOW Performed at [facility], [address], [place], [place] [address]    Report Status PENDING  Incomplete  Blood Culture ID Panel (Reflexed)     Status: Abnormal   Collection Time: 03/06/23 11:35 AM  Result Value Ref Range Status   Enterococcus faecalis DETECTED (A) NOT DETECTED Final    Comment: CRITICAL RESULT CALLED TO, READ BACK BY AND VERIFIED WITH: PHARMD J MENEDENHALL [id number] AT 1019 BY CM    Enterococcus Faecium NOT DETECTED NOT DETECTED Final   Listeria monocytogenes NOT DETECTED NOT DETECTED Final   Staphylococcus  species DETECTED (A) NOT DETECTED Final    Comment: CRITICAL RESULT CALLED TO, READ BACK BY AND VERIFIED WITH: PHARMD J MENDENHALL [id number] AT 1019 BY CM    Staphylococcus aureus (BCID) DETECTED (A) NOT DETECTED Final    Comment: Methicillin (oxacillin)-resistant Staphylococcus aureus (MRSA). MRSA is predictably resistant to beta-lactam antibiotics (except ceftaroline). Preferred therapy is vancomycin unless clinically contraindicated. Patient requires contact precautions if  hospitalized. CRITICAL RESULT CALLED TO, READ BACK BY AND VERIFIED WITH: PHARMD J MENEDENHALL [id number] AT 1019 BY CM    Staphylococcus epidermidis NOT DETECTED NOT DETECTED Final   Staphylococcus lugdunensis NOT DETECTED NOT DETECTED Final   Streptococcus species NOT DETECTED NOT DETECTED Final   Streptococcus agalactiae NOT DETECTED NOT DETECTED Final   Streptococcus pneumoniae NOT DETECTED NOT DETECTED Final   Streptococcus pyogenes NOT DETECTED NOT DETECTED Final   A.calcoaceticus-baumannii NOT DETECTED NOT DETECTED Final   Bacteroides fragilis NOT DETECTED NOT DETECTED Final   Enterobacterales NOT DETECTED NOT DETECTED Final   Enterobacter cloacae complex NOT DETECTED NOT DETECTED Final   Escherichia coli NOT DETECTED NOT DETECTED Final   Klebsiella aerogenes NOT DETECTED NOT DETECTED Final   Klebsiella oxytoca NOT DETECTED NOT DETECTED Final   Klebsiella pneumoniae NOT DETECTED NOT DETECTED Final   Proteus species NOT DETECTED NOT DETECTED Final   Salmonella species NOT DETECTED NOT DETECTED Final   Serratia marcescens NOT DETECTED NOT DETECTED Final   Haemophilus influenzae NOT DETECTED NOT DETECTED Final   Neisseria meningitidis NOT DETECTED NOT DETECTED Final   Pseudomonas aeruginosa NOT DETECTED NOT DETECTED Final   Stenotrophomonas maltophilia NOT DETECTED NOT DETECTED Final   Candida albicans NOT DETECTED NOT DETECTED Final   Candida auris NOT DETECTED NOT  DETECTED Final   Candida glabrata NOT DETECTED  NOT DETECTED Final   Candida krusei NOT DETECTED NOT DETECTED Final   Candida parapsilosis NOT DETECTED NOT DETECTED Final   Candida tropicalis NOT DETECTED NOT DETECTED Final   Cryptococcus neoformans/gattii NOT DETECTED NOT DETECTED Final   Meth resistant mecA/C and MREJ DETECTED (A) NOT DETECTED Final    Comment: CRITICAL RESULT CALLED TO, READ BACK BY AND VERIFIED WITH: PHARMD J MENEDENHALL [id number] AT 1019 BY CM    Vancomycin resistance NOT DETECTED NOT DETECTED Final    Comment: Performed at [facility], [address], [place], [place] [address]  MRSA Next Gen by PCR, Nasal     Status: Abnormal   Collection Time: 03/06/23  6:44 PM   Specimen: Nasal Mucosa; Nasal Swab  Result Value Ref Range Status   MRSA by PCR Next Gen DETECTED (A) NOT DETECTED Final    Comment: RESULT CALLED TO, READ BACK BY AND VERIFIED WITH: [facility] BEAVER 2104 03/06/23 BY VIRAY,J (NOTE) The GeneXpert MRSA Assay (FDA approved for NASAL specimens only), is one component of a comprehensive MRSA colonization surveillance program. It is not intended to diagnose MRSA infection nor to guide or monitor treatment for MRSA infections. Test performance is not FDA approved in patients less than 76 years old. Performed at [facility], [address], [place], [place] [address]   Culture, blood (Routine X 2) w Reflex to ID Panel     Status: None (Preliminary result)   Collection Time: 03/07/23 12:08 PM   Specimen: BLOOD  Result Value Ref Range Status   Specimen Description   Final    BLOOD ARTERIAL DRAW Performed at [facility], [address] [place], [place] [address]    Special Requests   Final    BOTTLES DRAWN AEROBIC AND ANAEROBIC Blood Culture results may not be optimal due to an excessive volume of blood received in culture bottles Performed at [facility], [address], [place], [place] [address]    Culture  Setup Time   Final    GRAM POSITIVE COCCI IN BOTH AEROBIC AND ANAEROBIC BOTTLES CRITICAL  RESULT CALLED TO, READ BACK BY AND VERIFIED WITH: K.THOMAS ON 03/08/2023 @14 :54 BY FRATTO,A  CRITICAL VALUE NOTED.  VALUE IS CONSISTENT WITH PREVIOUSLY REPORTED AND CALLED VALUE. Performed at [facility], [address], [place], [place] [address]    Culture GRAM POSITIVE COCCI  Final   Report Status PENDING  Incomplete  Culture, blood (Routine X 2) w Reflex to ID Panel     Status: None (Preliminary result)   Collection Time: 03/07/23 12:24 PM   Specimen: BLOOD  Result Value Ref Range Status   Specimen Description   Final    BLOOD ARTERIAL DRAW Performed at [facility], [address] [place], [place] [address]    Special Requests   Final    BOTTLES DRAWN AEROBIC AND ANAEROBIC Blood Culture results may not be optimal due to an excessive volume of blood received in culture bottles Performed at [facility], [address] [place], [place] [address]    Culture  Setup Time   Final    GRAM POSITIVE COCCI IN BOTH AEROBIC AND ANAEROBIC BOTTLES CRITICAL RESULT CALLED TO, READ BACK BY AND VERIFIED WITH: B.DILDI ON 03/08/2023 @12 :27 BY T.HAMER CRITICAL VALUE NOTED.  VALUE IS CONSISTENT WITH PREVIOUSLY REPORTED AND CALLED VALUE. Performed at [facility], [address] [place], [place] [address]    Culture GRAM POSITIVE COCCI  Final   Report Status PENDING  Incomplete         Radiology Studies: CT MAXILLOFACIAL WO CONTRAST  Result Date: 03/08/2023 CLINICAL DATA:  Abscess, MRSA bacteremia and E fascia talus bacteremia and hemodialysis patient with AV graft. Possible cavitation, saw a dentist ovary year ago. EXAM: CT MAXILLOFACIAL WITHOUT CONTRAST TECHNIQUE: Multidetector CT imaging of the maxillofacial structures was performed. Multiplanar CT image reconstructions were also generated. RADIATION DOSE REDUCTION: This exam was performed according to the departmental dose-optimization program which includes automated exposure control, adjustment of the mA and/or kV according to  patient size and/or use of iterative reconstruction technique. COMPARISON:  03/06/2023. FINDINGS: Osseous: No acute fracture. There is partial opacification of the mastoid air cells bilaterally, unchanged from the previous exam. Poor dentition is noted with multiple cavitations and periapical lucencies involving the mandible and maxilla Orbits: No traumatic or inflammatory finding. Sinuses: Mucosal thickening is present in the maxillary sinuses bilaterally in the sphenoid sinus on the left. There is partial opacification of the ethmoid air cells on the left. No air-fluid levels are seen. Soft tissues: No acute abnormality. No focal fluid collection is seen to suggest abscess. Examination for infection is limited due to lack of IV contrast. Limited intracranial: Diffuse cerebral atrophy is noted. Periventricular white matter hypodensities are present bilaterally. No hydrocephalus. IMPRESSION: 1. No focal fluid collection to suggest abscess. 2. Poor dentition with multiple cavitations and periapical lucencies. 3. Chronic sinus disease. Electronically Signed   By: Thornell Sartorius M.D.   On: 03/08/2023 20:26   [place] LT UPPER EXTREM LTD SOFT TISSUE NON VASCULAR  Result Date: 03/08/2023 CLINICAL DATA:  Infection suspected.  Hemodialysis patient. EXAM: ULTRASOUND LEFT UPPER EXTREMITY LIMITED TECHNIQUE: Ultrasound examination of the upper extremity soft tissues was performed in the area of clinical concern. COMPARISON:  None of the LEFT upper extremity. Patient had RIGHT elbow radiographs done 03/06/2023. Chest CT 03/06/2023. FINDINGS: Examination of the left upper arm was performed targeted to the patient's hemodialysis graft. The graft is patent without evidence of thrombosis or surrounding focal fluid collection. Mild surrounding soft tissue edema noted without hypervascularity on Doppler ultrasound. IMPRESSION: 1. Mild nonspecific edema in the left upper arm. 2. No focal fluid collection to suggest abscess. 3. Patent  hemodialysis vascular shunt. Electronically Signed   By: Carey Bullocks M.D.   On: 03/08/2023 19:04        Scheduled Meds:  amLODipine  5 mg Oral q morning   atorvastatin  20 mg Oral q morning   Chlorhexidine Gluconate Cloth  6 each Topical Q0600   darbepoetin (ARANESP) injection - DIALYSIS  60 mcg Subcutaneous Q Wed-1800   feeding supplement (NEPRO CARB STEADY)  237 mL Oral TID BM   insulin aspart  0-5 Units Subcutaneous QHS   insulin aspart  0-9 Units Subcutaneous TID WC   levothyroxine  25 mcg Oral QAC breakfast   metoprolol tartrate  50 mg Oral BID   multivitamin with minerals  1 tablet Oral Daily   pantoprazole  40 mg Oral Daily   sodium chloride flush  3 mL Intravenous Q12H   Continuous Infusions:  sodium chloride     vancomycin     [START ON 03/10/2023] vancomycin       LOS: 3 days    Time spent: 35 minutes    Tran Arzuaga Hoover Brunette, DO [facility]  If 7PM-7AM, please contact night-coverage [email or web address] 03/09/2023, 12:18 PM

## 2023-03-09 NOTE — Plan of Care (Signed)
Problem: Education: Goal: Knowledge of General Education information will improve Description: Including pain rating scale, medication(s)/side effects and non-pharmacologic comfort measures Outcome: Progressing   Problem: Clinical Measurements: Goal: Ability to maintain clinical measurements within normal limits will improve Outcome: Progressing Goal: Will remain free from infection Outcome: Progressing Goal: Diagnostic test results will improve Outcome: Progressing Goal: Respiratory complications will improve Outcome: Progressing Goal: Cardiovascular complication will be avoided Outcome: Progressing   Problem: Activity: Goal: Risk for activity intolerance will decrease Outcome: Progressing   Problem: Nutrition: Goal: Adequate nutrition will be maintained Outcome: Progressing   Problem: Pain Managment: Goal: General experience of comfort will improve Outcome: Progressing   Problem: Safety: Goal: Ability to remain free from injury will improve Outcome: Progressing   Problem: Skin Integrity: Goal: Risk for impaired skin integrity will decrease Outcome: Progressing

## 2023-03-09 NOTE — Consult Note (Signed)
VASC[facility] SPECIAL[facility] OF [place]  ASSESSMENT / PLAN: 62 y.o. male with concern for infected left arm arteriovenous graft. There is no clear evidence of infection on duplex. There is a small focus of erythema on the skin overlaying the graft. I am not sure the graft needs to be completely excised, but will discuss this further with the ID team.   CHIEF COMPLAINT: bacteremia  HISTORY OF PRESENT ILLNESS: [name] [name] is a 62 y.o. male to the hospital for altered mental status.  The patient has significant past medical history including end-stage renal disease on dialysis via left arm arteriovenous graft created earlier this year.  He was found to have a urinary tract infection as well as COVID-pneumonia.  He was found to have MRSA and enterococcal bacteremia.  Infectious disease saw the patient and suggested that his arteriovenous graft be may need to be removed.  Patient reports the graft is working well.  He has no pain around the graft.  He has not noticed any drainage from the graft.  Past Medical History:  Diagnosis Date   Anemia    Arthritis    Cardiomyopathy [facility])    July 2022   Diabetes mellitus (HCC)    GERD (gastroesophageal reflux disease)    HTN (hypertension)    Hyperlipidemia    Nephrotic syndrome    Obesity    Pulmonary hypertension (HCC)    July 2022 in setting of nephrotic syndrome   Secondary hyperparathyroidism of renal origin [facility])     Past Surgical History:  Procedure Laterality Date   AV FISTULA PLACEMENT Left 12/27/2022   Procedure: INSERTION OF LEFT ARM ARTERIOVENOUS (AV) GORE-TEX GRAFT;  Surgeon: Chuck Hint, MD;  Location: [facility] OR;  Service: Vascular;  Laterality: Left;   BIOPSY  01/13/2023   Procedure: BIOPSY;  Surgeon: Franky Macho, MD;  Location[facility];  Service: Endoscopy;;   CATARACT EXTRACTION, BILATERAL  2018   COLONOSCOPY WITH PROPOFOL N/A 01/13/2023   Procedure: COLONOSCOPY WITH PROPOFOL;  Surgeon: Franky Macho, MD;   Location: [facility];  Service: Endoscopy;  Laterality: N/A;   INSERTION OF DIALYSIS CATHETER Right 12/27/2022   Procedure: INSERTION OF Right Internal Jugular  DIALYSIS CATHETER;  Surgeon: Chuck Hint, MD;  Location: [facility] OR;  Service: Vascular;  Laterality: Right;   TONSILLECTOMY      Family History  Problem Relation Age of Onset   Hypertension Mother    Diabetes Mother    Cancer Father        prostate cancer   Hypertension Maternal Grandfather    Rheum arthritis Maternal Grandmother    CAD Neg Hx     Social History   Socioeconomic History   Marital status: Single    Spouse name: Not on file   Number of children: Not on file   Years of education: Not on file   Highest education level: Not on file  Occupational History   Not on file  Tobacco Use   Smoking status: Never   Smokeless tobacco: Never  Vaping Use   Vaping status: Never Used  Substance and Sexual Activity   Alcohol use: Never   Drug use: Never   Sexual activity: Not on file  Other Topics Concern   Not on file  Social History Narrative   Not on file   Social Determinants of Health   Financial Resource Strain: Not on file  Food Insecurity: No Food Insecurity (03/06/2023)   Hunger Vital Sign  Worried About Programme researcher, broadcasting/film/video in the Last Year: Never true    Ran Out of Food in the Last Year: Never true  Transportation Needs: No Transportation Needs (03/06/2023)   PRAPARE - Administrator, Civil Service (Medical): No    Lack of Transportation (Non-Medical): No  Physical Activity: Not on file  Stress: Not on file  Social Connections: Not on file  Intimate Partner Violence: Not At Risk (03/06/2023)   Humiliation, Afraid, Rape, and Kick questionnaire    Fear of Current or Ex-Partner: No    Emotionally Abused: No    Physically Abused: No    Sexually Abused: No    No Known Allergies  Current Facility-Administered Medications  Medication Dose Route Frequency Provider Last Rate  Last Admin   0.9 %  sodium chloride infusion  250 mL Intravenous PRN Sherryll Burger, Pratik D, DO       acetaminophen (TYLENOL) tablet 650 mg  650 mg Oral Q6H PRN Maurilio Lovely D, DO   650 mg at 03/06/23 2304   Or   acetaminophen (TYLENOL) suppository 650 mg  650 mg Rectal Q6H PRN Sherryll Burger, Pratik D, DO       amLODipine (NORVASC) tablet 5 mg  5 mg Oral q morning Shah, Pratik D, DO       atorvastatin (LIPITOR) tablet 20 mg  20 mg Oral q morning Sherryll Burger, Pratik D, DO   20 mg at 03/09/23 0856   Chlorhexidine Gluconate Cloth 2 % PADS 6 each  6 each Topical Q0600 Maurilio Lovely D, DO   6 each at 03/09/23 3244   Darbepoetin Alfa (ARANESP) injection 60 mcg  60 mcg Subcutaneous Q Wed-1800 Anthony Sar, MD       feeding supplement (NEPRO CARB STEADY) liquid 237 mL  237 mL Oral TID BM Sherryll Burger, Pratik D, DO   237 mL at 03/07/23 2200   insulin aspart (novoLOG) injection 0-5 Units  0-5 Units Subcutaneous QHS Sherryll Burger, Pratik D, DO       insulin aspart (novoLOG) injection 0-9 Units  0-9 Units Subcutaneous TID WC Sherryll Burger, Pratik D, DO   1 Units at 03/08/23 1639   levothyroxine (SYNTHROID) tablet 25 mcg  25 mcg Oral QAC breakfast Sherryll Burger, Pratik D, DO   25 mcg at 03/09/23 0612   lidocaine (PF) (XYLOCAINE) 1 % injection 5 mL  5 mL Intradermal PRN Anthony Sar, MD       lidocaine-prilocaine (EMLA) cream 1 Application  1 Application Topical PRN Anthony Sar, MD       metoprolol tartrate (LOPRESSOR) tablet 50 mg  50 mg Oral BID Sherryll Burger, Pratik D, DO   50 mg at 03/08/23 2207   multivitamin with minerals tablet 1 tablet  1 tablet Oral Daily Sherryll Burger, Pratik D, DO   1 tablet at 03/09/23 0856   ondansetron (ZOFRAN) tablet 4 mg  4 mg Oral Q6H PRN Sherryll Burger, Pratik D, DO       Or   ondansetron (ZOFRAN) injection 4 mg  4 mg Intravenous Q6H PRN Sherryll Burger, Pratik D, DO       pantoprazole (PROTONIX) EC tablet 40 mg  40 mg Oral Daily Sherryll Burger, Pratik D, DO   40 mg at 03/09/23 0855   pentafluoroprop-tetrafluoroeth (GEBAUERS) aerosol 1 Application  1 Application Topical PRN Anthony Sar, MD       sodium chloride flush (NS) 0.9 % injection 3 mL  3 mL Intravenous Q12H Shah, Pratik D, DO   3 mL at 03/09/23 0856   sodium  chloride flush (NS) 0.9 % injection 3 mL  3 mL Intravenous PRN Sherryll Burger, Pratik D, DO       vancomycin (VANCOREADY) IVPB 750 mg/150 mL  750 mg Intravenous Q T,Th,Sa-HD Sherryll Burger, Pratik D, DO       [START ON 03/10/2023] vancomycin (VANCOREADY) IVPB 750 mg/150 mL  750 mg Intravenous Q M,W,F-HD Shah, Pratik D, DO        PHYSICAL EXAM Vitals:   03/08/23 1412 03/08/23 2126 03/09/23 0520 03/09/23 1523  BP: 107/64 (!) 164/71 (!) 179/68 (!) 165/73  Pulse: 64 68 61 71  Resp:    18  Temp:  98 F (36.7 C) 98.2 F (36.8 C) 98 F (36.7 C)  TempSrc:  Oral Oral Oral  SpO2:  99% 100% 100%  Weight:      Height:       Left arm arteriovenous graft with good thrill.  Small area of redness as pictured below. No purulence, no other signs of obvious infection    PERTINENT LABORATORY AND RADIOLOGIC DATA  Most recent CBC    Latest Ref Rng & Units 03/09/2023    3:54 PM 03/08/2023    5:42 AM 03/07/2023    4:37 AM  CBC  WBC 4.0 - 10.5 K/uL 6.6  6.0  3.3   Hemoglobin 13.0 - 17.0 g/dL 13.0  86.5  78.4   Hematocrit 39.0 - 52.0 % 37.0  37.1  34.1   Platelets 150 - 400 K/uL PENDING  61  76      Most recent CMP    Latest Ref Rng & Units 03/09/2023    3:54 PM 03/08/2023    5:42 AM 03/07/2023    4:37 AM  CMP  Glucose 70 - 99 mg/dL 696  87  295   BUN 8 - 23 mg/dL 24  15  25    Creatinine 0.61 - 1.24 mg/dL 2.84  1.32  4.40   Sodium 135 - 145 mmol/L 135  136  137   Potassium 3.5 - 5.1 mmol/L 3.2  3.6  2.8   Chloride 98 - 111 mmol/L 94  99  100   CO2 22 - 32 mmol/L 26  25  26    Calcium 8.9 - 10.3 mg/dL 7.1  7.2  7.2     Renal function Estimated Creatinine Clearance: 12.6 mL/min (A) (by C-G formula based on SCr of 5.28 mg/dL (H)).  Hgb A1c MFr Bld (%)  Date Value  01/11/2023 5.1    LDL Cholesterol  Date Value Ref Range Status  04/27/2020 102 (H) 0 - 99 mg/dL Final     Comment:           Total Cholesterol/HDL:CHD Risk Coronary Heart Disease Risk Table                     Men   Women  1/2 Average Risk   3.4   3.3  Average Risk       5.0   4.4  2 X Average Risk   9.6   7.1  3 X Average Risk  23.4   11.0        Use the calculated Patient Ratio above and the CHD Risk Table to determine the patient's CHD Risk.        ATP III CLASSIFICATION (LDL):  <100     mg/dL   Optimal  102-725  mg/dL   Near or Above  Optimal  130-159  mg/dL   Borderline  710-626  mg/dL   High  >948     mg/dL   Very High Performed at [facility], [address], [place], [place] [address]     CLINICAL DATA:  Infection suspected.  Hemodialysis patient.   EXAM: ULTRASOUND LEFT UPPER EXTREMITY LIMITED   TECHNIQUE: Ultrasound examination of the upper extremity soft tissues was performed in the area of clinical concern.   COMPARISON:  None of the LEFT upper extremity. Patient had RIGHT elbow radiographs done 03/06/2023. Chest CT 03/06/2023.   FINDINGS: Examination of the left upper arm was performed targeted to the patient's hemodialysis graft. The graft is patent without evidence of thrombosis or surrounding focal fluid collection. Mild surrounding soft tissue edema noted without hypervascularity on Doppler ultrasound.   IMPRESSION: 1. Mild nonspecific edema in the left upper arm. 2. No focal fluid collection to suggest abscess. 3. Patent hemodialysis vascular shunt.     Electronically Signed   By: Carey Bullocks M.D.   On: 03/08/2023 19:04  [name]. Lenell Antu, MD FACS [facility] Phone Number: [phone] 03/09/2023 4:47 PM   Total time spent on preparing this encounter including chart review, data review, collecting history, examining the patient, coordinating care for this new patient, 60 minutes.  Portions of this report may have been transcribed using voice recognition software.  Every effort has  been made to ensure accuracy; however, inadvertent computerized transcription errors may still be present.

## 2023-03-10 ENCOUNTER — Inpatient Hospital Stay (HOSPITAL_COMMUNITY): Payer: Medicare HMO

## 2023-03-10 DIAGNOSIS — N186 End stage renal disease: Secondary | ICD-10-CM | POA: Diagnosis not present

## 2023-03-10 DIAGNOSIS — U071 COVID-19: Secondary | ICD-10-CM

## 2023-03-10 DIAGNOSIS — R7881 Bacteremia: Secondary | ICD-10-CM

## 2023-03-10 DIAGNOSIS — G9341 Metabolic encephalopathy: Secondary | ICD-10-CM | POA: Diagnosis not present

## 2023-03-10 DIAGNOSIS — E039 Hypothyroidism, unspecified: Secondary | ICD-10-CM

## 2023-03-10 DIAGNOSIS — I38 Endocarditis, valve unspecified: Secondary | ICD-10-CM | POA: Diagnosis not present

## 2023-03-10 DIAGNOSIS — B9562 Methicillin resistant Staphylococcus aureus infection as the cause of diseases classified elsewhere: Secondary | ICD-10-CM

## 2023-03-10 LAB — BASIC METABOLIC PANEL
Anion gap: 7 (ref 5–15)
BUN: 14 mg/dL (ref 8–23)
CO2: 27 mmol/L (ref 22–32)
Calcium: 6.8 mg/dL — ABNORMAL LOW (ref 8.9–10.3)
Chloride: 100 mmol/L (ref 98–111)
Creatinine, Ser: 3.13 mg/dL — ABNORMAL HIGH (ref 0.61–1.24)
GFR, Estimated: 21 mL/min — ABNORMAL LOW (ref >60)
Glucose, Bld: 113 mg/dL — ABNORMAL HIGH (ref 70–99)
Potassium: 2.8 mmol/L — ABNORMAL LOW (ref 3.5–5.1)
Sodium: 134 mmol/L — ABNORMAL LOW (ref 135–145)

## 2023-03-10 LAB — CBC
HCT: 31.1 % — ABNORMAL LOW (ref 39.0–52.0)
Hemoglobin: 10 g/dL — ABNORMAL LOW (ref 13.0–17.0)
MCH: 27.9 pg (ref 26.0–34.0)
MCHC: 32.2 g/dL (ref 30.0–36.0)
MCV: 86.6 fL (ref 80.0–100.0)
Platelets: 78 10*3/uL — ABNORMAL LOW (ref 150–400)
RBC: 3.59 MIL/uL — ABNORMAL LOW (ref 4.22–5.81)
RDW: 14.6 % (ref 11.5–15.5)
WBC: 6.9 10*3/uL (ref 4.0–10.5)
nRBC: 0 % (ref 0.0–0.2)

## 2023-03-10 LAB — GLUCOSE, CAPILLARY
Glucose-Capillary: 113 mg/dL — ABNORMAL HIGH (ref 70–99)
Glucose-Capillary: 111 mg/dL — ABNORMAL HIGH (ref 70–99)
Glucose-Capillary: 137 mg/dL — ABNORMAL HIGH (ref 70–99)
Glucose-Capillary: 126 mg/dL — ABNORMAL HIGH (ref 70–99)

## 2023-03-10 LAB — MAGNESIUM: Magnesium: 1.5 mg/dL — ABNORMAL LOW (ref 1.7–2.4)

## 2023-03-10 LAB — ECHOCARDIOGRAM COMPLETE
Area-P 1/2: 1.84 cm2
Calc EF: 57.3 %
S' Lateral: 3.1 cm
Single Plane A2C EF: 55 %
Single Plane A4C EF: 58 %

## 2023-03-10 LAB — CULTURE, BLOOD (ROUTINE X 2)

## 2023-03-10 MED ORDER — CHLORHEXIDINE GLUCONATE CLOTH 2 % EX PADS
6.0000 | MEDICATED_PAD | Freq: Every day | CUTANEOUS | Status: DC
  Administered 2023-03-10 – 2023-03-16 (×2): 6 via TOPICAL

## 2023-03-10 MED ORDER — MUPIROCIN 2 % EX OINT
TOPICAL_OINTMENT | Freq: Two times a day (BID) | CUTANEOUS | Status: DC
  Filled 2023-03-10 (×8): qty 22

## 2023-03-10 NOTE — Progress Notes (Signed)
Subjective: Seen in room on COVID isolation/ currently on hemodialysis tolerating UF, some problems initially with cannulation of AV fistula using 17-gauge now.  He has no complaints currently aware he is at [facility] dialysis schedule Monday Wednesday Friday  Objective Vital signs in last 24 hours: Vitals:   03/10/23 0945 03/10/23 1010 03/10/23 1030 03/10/23 1045  BP: (!) 167/71 118/66 121/72   Pulse: 80 76 76 77  Resp: 16 18 16 16   Temp: 97.8 F (36.6 C)     TempSrc:      SpO2: 99% 100% 96% 100%  Weight: 63.1 kg     Height:       Weight change:   Physical Exam: General: Chronically ill adult male, NAD on HD Heart: RRR no MRG Lungs: CTA bilaterally unlabored breathing Abdomen: NABS, soft NTND Extremities: No pedal edema Dialysis Access: LUA AVF patent on HD  OP HD : [facility],, MWF.  4 hours.  EDW 64 kg.  LUE AVG 15-gauge.  Flow rates: 400/autoflow 1.5.  3K/2.5 calcium.  No heparin.  Mircera 75 mcg every 2 weeks (last dose 9/11).  Venofer 50 mg q. weekly. -HD on MWF schedule typically, off schedule yesterday, will do off schedule today and then back on regular schedule Friday - no reported access issues last rx  Problem/Plan: ESRD= HD MWF, was off schedule HD yesterday back on schedule today  COVID-19 PNA = on remdesivir on isolation /plan per admit  Altered mental status = not quite back to normal.  MRSA and ED faecalis bacteremia playing a role MRSA and E faecalis bacteremia -ID following /on  vancomycin / ultrasound of AVG nonspecific  VVS  seeing  -Dr. Juanetta Gosling today noted plan to excise graft next week tentatively Wednesday  Possible AVG infection= see above multiple problems cannulating today but using 17 and successful Anemia of CKD -Hgb 11.8  secondary hyperparathyroidism -placed on binders HTN/volume -chest x-ray no excess volume (COVID-pneumonia picture) some hypertension improved with HD today, below EDW.  Lenny Pastel, PA-C [facility] Beeper [phone] 03/10/2023,11:08 AM  LOS: 4 days   Labs: Basic Metabolic Panel: Recent Labs  Lab 03/07/23 0437 03/08/23 0542 03/09/23 1554  NA 137 136 135  K 2.8* 3.6 3.2*  CL 100 99 94*  CO2 26 25 26   GLUCOSE 111* 87 148*  BUN 25* 15 24*  CREATININE 6.81* 4.18* 5.28*  CALCIUM 7.2* 7.2* 7.1*   Liver Function Tests: Recent Labs  Lab 03/06/23 1026  AST 21  ALT 17  ALKPHOS 154*  BILITOT 1.6*  PROT 6.5  ALBUMIN 2.6*   No results for input(s): "LIPASE", "AMYLASE" in the last 168 hours. Recent Labs  Lab 03/06/23 1026  AMMONIA 15   CBC: Recent Labs  Lab 03/06/23 1026 03/07/23 0437 03/08/23 0542 03/09/23 1554  WBC 5.3 3.3* 6.0 6.6  NEUTROABS 4.0  --   --   --   HGB 13.4 10.4* 11.6* 11.8*  HCT 41.9 34.1* 37.1* 37.0*  MCV 89.5 90.7 91.2 87.7  PLT 99* 76* 61* 86*   Cardiac Enzymes: No results for input(s): "CKTOTAL", "CKMB", "CKMBINDEX", "TROPONINI" in the last 168 hours. CBG: Recent Labs  Lab 03/09/23 0740 03/09/23 1146 03/09/23 1625 03/09/23 2029 03/10/23 0751  GLUCAP 114* 137* 140* 199* 113*    Studies/Results: CT MAXILLOFACIAL WO CONTRAST  Result Date: 03/08/2023 CLINICAL DATA:  Abscess, MRSA bacteremia and E fascia talus bacteremia and hemodialysis patient with AV graft. Possible cavitation, saw a dentist ovary year ago. EXAM: CT  MAXILLOFACIAL WITHOUT CONTRAST TECHNIQUE: Multidetector CT imaging of the maxillofacial structures was performed. Multiplanar CT image reconstructions were also generated. RADIATION DOSE REDUCTION: This exam was performed according to the departmental dose-optimization program which includes automated exposure control, adjustment of the mA and/or kV according to patient size and/or use of iterative reconstruction technique. COMPARISON:  03/06/2023. FINDINGS: Osseous: No acute fracture. There is partial opacification of the mastoid air cells bilaterally, unchanged from the previous exam. Poor dentition is noted with  multiple cavitations and periapical lucencies involving the mandible and maxilla Orbits: No traumatic or inflammatory finding. Sinuses: Mucosal thickening is present in the maxillary sinuses bilaterally in the sphenoid sinus on the left. There is partial opacification of the ethmoid air cells on the left. No air-fluid levels are seen. Soft tissues: No acute abnormality. No focal fluid collection is seen to suggest abscess. Examination for infection is limited due to lack of IV contrast. Limited intracranial: Diffuse cerebral atrophy is noted. Periventricular white matter hypodensities are present bilaterally. No hydrocephalus. IMPRESSION: 1. No focal fluid collection to suggest abscess. 2. Poor dentition with multiple cavitations and periapical lucencies. 3. Chronic sinus disease. Electronically Signed   By: Thornell Sartorius M.D.   On: 03/08/2023 20:26   [place] LT UPPER EXTREM LTD SOFT TISSUE NON VASCULAR  Result Date: 03/08/2023 CLINICAL DATA:  Infection suspected.  Hemodialysis patient. EXAM: ULTRASOUND LEFT UPPER EXTREMITY LIMITED TECHNIQUE: Ultrasound examination of the upper extremity soft tissues was performed in the area of clinical concern. COMPARISON:  None of the LEFT upper extremity. Patient had RIGHT elbow radiographs done 03/06/2023. Chest CT 03/06/2023. FINDINGS: Examination of the left upper arm was performed targeted to the patient's hemodialysis graft. The graft is patent without evidence of thrombosis or surrounding focal fluid collection. Mild surrounding soft tissue edema noted without hypervascularity on Doppler ultrasound. IMPRESSION: 1. Mild nonspecific edema in the left upper arm. 2. No focal fluid collection to suggest abscess. 3. Patent hemodialysis vascular shunt. Electronically Signed   By: Carey Bullocks M.D.   On: 03/08/2023 19:04   Medications:  sodium chloride     vancomycin Stopped (03/09/23 2216)   vancomycin      amLODipine  5 mg Oral q morning   atorvastatin  20 mg Oral q  morning   Chlorhexidine Gluconate Cloth  6 each Topical Q0600   Chlorhexidine Gluconate Cloth  6 each Topical Q0600   darbepoetin (ARANESP) injection - DIALYSIS  60 mcg Subcutaneous Q Wed-1800   feeding supplement (NEPRO CARB STEADY)  237 mL Oral TID BM   insulin aspart  0-5 Units Subcutaneous QHS   insulin aspart  0-9 Units Subcutaneous TID WC   levothyroxine  25 mcg Oral QAC breakfast   metoprolol tartrate  50 mg Oral BID   multivitamin with minerals  1 tablet Oral Daily   mupirocin ointment   Nasal BID   pantoprazole  40 mg Oral Daily   sodium chloride flush  3 mL Intravenous Q12H

## 2023-03-10 NOTE — Progress Notes (Signed)
VASC[facility]IN SPECIALISTS OF [place] PROGRESS NOTE  ASSESSMENT / PLAN: [name] [name] is a 62 y.o. male with concern for left arm arteriovenous graft infection. Will plan to excise graft next week. Ideally we would wait for blood cultures to clear so we could place a TDC in the same setting. Will tentatively plan for surgery next Wednesday.    SUBJECTIVE: No complaints.  Left arm not tender.  Reviewed infectious disease findings with Dr. Drue Second via telephone.  High concern for seeding of the graft, the graft also appeared clinically infected several days ago which has improved with IV antibiotic therapy.  OBJECTIVE: BP (!) 157/88 (BP Location: Right Arm)   Pulse 72   Temp 97.7 F (36.5 C) (Oral)   Resp 18   Ht 5\' 10"  (1.778 m)   Wt 63.1 kg   SpO2 100%   BMI 19.96 kg/m   Intake/Output Summary (Last 24 hours) at 03/10/2023 0912 Last data filed at 03/10/2023 7829 Gross per 24 hour  Intake 480 ml  Output 0 ml  Net 480 ml    chronically ill man in no distress Regular rate and rhythm Unlabored breathing Left upper extremity arteriovenous graft with good thrill overall appears to be improving     Latest Ref Rng & Units 03/09/2023    3:54 PM 03/08/2023    5:42 AM 03/07/2023    4:37 AM  CBC  WBC 4.0 - 10.5 K/uL 6.6  6.0  3.3   Hemoglobin 13.0 - 17.0 g/dL 56.2  13.0  86.5   Hematocrit 39.0 - 52.0 % 37.0  37.1  34.1   Platelets 150 - 400 K/uL 86  61  76         Latest Ref Rng & Units 03/09/2023    3:54 PM 03/08/2023    5:42 AM 03/07/2023    4:37 AM  CMP  Glucose 70 - 99 mg/dL 784  87  696   BUN 8 - 23 mg/dL 24  15  25    Creatinine 0.61 - 1.24 mg/dL 2.95  2.84  1.32   Sodium 135 - 145 mmol/L 135  136  137   Potassium 3.5 - 5.1 mmol/L 3.2  3.6  2.8   Chloride 98 - 111 mmol/L 94  99  100   CO2 22 - 32 mmol/L 26  25  26    Calcium 8.9 - 10.3 mg/dL 7.1  7.2  7.2     Estimated Creatinine Clearance: 12.6 mL/min (A) (by C-G formula based on SCr of 5.28 mg/dL (H)).  [name].  Lenell Antu, MD [facility] Phone Number: [phone] 03/10/2023 9:12 AM

## 2023-03-10 NOTE — Progress Notes (Signed)
OT Cancellation Note  Patient Details Name: [name] [name] MRN: [id number] DOB: April 16, 1961   Cancelled Treatment:    Reason Eval/Treat Not Completed: Patient at procedure or test/ unavailable (Attempted to see pt earlier after HD but he was receiving echo in room, then had NTs assisting with clean up during second attempt. OT to make efforts to follow-up with pt as able.)  03/10/2023  AB, OTR/L  Acute Rehabilitation Services  Office: [phone]   Tristan Schroeder 03/10/2023, 4:03 PM

## 2023-03-10 NOTE — Plan of Care (Signed)
  Problem: Education: Goal: Knowledge of General Education information will improve Description: Including pain rating scale, medication(s)/side effects and non-pharmacologic comfort measures Outcome: Progressing   Problem: Health Behavior/Discharge Planning: Goal: Ability to manage health-related needs will improve Outcome: Progressing   Problem: Clinical Measurements: Goal: Ability to maintain clinical measurements within normal limits will improve Outcome: Progressing Goal: Will remain free from infection Outcome: Progressing Goal: Diagnostic test results will improve Outcome: Progressing Goal: Respiratory complications will improve Outcome: Progressing Goal: Cardiovascular complication will be avoided Outcome: Progressing   Problem: Activity: Goal: Risk for activity intolerance will decrease Outcome: Progressing   Problem: Nutrition: Goal: Adequate nutrition will be maintained Outcome: Progressing   Problem: Coping: Goal: Level of anxiety will decrease Outcome: Progressing   Problem: Elimination: Goal: Will not experience complications related to bowel motility Outcome: Progressing Goal: Will not experience complications related to urinary retention Outcome: Progressing   Problem: Pain Managment: Goal: General experience of comfort will improve Outcome: Progressing   Problem: Safety: Goal: Ability to remain free from injury will improve Outcome: Progressing   Problem: Skin Integrity: Goal: Risk for impaired skin integrity will decrease Outcome: Progressing   Problem: Education: Goal: Knowledge of risk factors and measures for prevention of condition will improve Outcome: Progressing   Problem: Coping: Goal: Psychosocial and spiritual needs will be supported Outcome: Progressing   Problem: Respiratory: Goal: Will maintain a patent airway Outcome: Progressing Goal: Complications related to the disease process, condition or treatment will be avoided or  minimized Outcome: Progressing   Problem: Education: Goal: Ability to describe self-care measures that may prevent or decrease complications (Diabetes Survival Skills Education) will improve Outcome: Progressing Goal: Individualized Educational Video(s) Outcome: Progressing   Problem: Coping: Goal: Ability to adjust to condition or change in health will improve Outcome: Progressing   Problem: Fluid Volume: Goal: Ability to maintain a balanced intake and output will improve Outcome: Progressing   Problem: Health Behavior/Discharge Planning: Goal: Ability to identify and utilize available resources and services will improve Outcome: Progressing Goal: Ability to manage health-related needs will improve Outcome: Progressing   Problem: Metabolic: Goal: Ability to maintain appropriate glucose levels will improve Outcome: Progressing   Problem: Nutritional: Goal: Maintenance of adequate nutrition will improve Outcome: Progressing Goal: Progress toward achieving an optimal weight will improve Outcome: Progressing   Problem: Skin Integrity: Goal: Risk for impaired skin integrity will decrease Outcome: Progressing   Problem: Tissue Perfusion: Goal: Adequacy of tissue perfusion will improve Outcome: Progressing   

## 2023-03-10 NOTE — Progress Notes (Signed)
   03/10/23 1330  Vitals  Temp 97.9 F (36.6 C)  Pulse Rate 80  Resp 18  BP 127/72  SpO2 100 %  O2 Device Room Air  Weight 62 kg  Type of Weight Post-Dialysis  Oxygen Therapy  Patient Activity (if Appropriate) In bed  Post Treatment  Dialyzer Clearance Lightly streaked  Hemodialysis Intake (mL) 0 mL  Liters Processed 53.1  Fluid Removed (mL) 1100 mL  Tolerated HD Treatment Yes  AVG/AVF Arterial Site Held (minutes) 10 minutes  AVG/AVF Venous Site Held (minutes) 10 minutes   Received patient in bed to unit.  Alert and oriented.  Informed consent signed and in chart.   TX duration:3.0  Patient tolerated well.  Transported back to the room  Alert, without acute distress.  Hand-off given to patient's nurse.   Access used: LUAG Access issues: see progress notes/flowsheets  Total UF removed: 1100 Medication(s) given: vancomycin iv x 1 as ordered   [facility]

## 2023-03-10 NOTE — Progress Notes (Signed)
[facility]    Date of Admission:  03/06/2023   Total days of antibiotics 5           ID: [name] [name] is a 62 y.o. 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  Denies cough  TTE today shows AV left coronary cusp vegetation.   Repeat blood cx from 9/26 ngtd at 24hr Medications:   amLODipine  5 mg Oral q morning   atorvastatin  20 mg Oral q morning   Chlorhexidine Gluconate Cloth  6 each Topical Q0600   Chlorhexidine Gluconate Cloth  6 each Topical Q0600   darbepoetin (ARANESP) injection - DIALYSIS  60 mcg Subcutaneous Q Wed-1800   feeding supplement (NEPRO CARB STEADY)  237 mL Oral TID BM   insulin aspart  0-5 Units Subcutaneous QHS   insulin aspart  0-9 Units Subcutaneous TID WC   levothyroxine  25 mcg Oral QAC breakfast   metoprolol tartrate  50 mg Oral BID   multivitamin with minerals  1 tablet Oral Daily   mupirocin ointment   Nasal BID   pantoprazole  40 mg Oral Daily   sodium chloride flush  3 mL Intravenous Q12H    Objective: Vital signs in last 24 hours: Temp:  [97.6 F (36.4 C)-97.9 F (36.6 C)] 97.9 F (36.6 C) (09/27 1330) Pulse Rate:  [57-81] 81 (09/27 1410) Resp:  [16-21] 18 (09/27 1330) BP: (92-179)/(53-88) 115/67 (09/27 1410) SpO2:  [96 %-100 %] 100 % (09/27 1410) Weight:  [62 kg-63.1 kg] 62 kg (09/27 1330)  Physical Exam  Constitutional: He is oriented to person, place, and time. He appears well-developed and well-nourished. No distress.  HENT:  Mouth/Throat: Oropharynx is clear and  moist. No oropharyngeal exudate.  Cardiovascular: Normal rate, regular rhythm and normal heart sounds. Exam reveals no gallop and no friction rub.  No murmur heard.  Pulmonary/Chest: Effort normal and breath sounds normal. No respiratory distress. He has no wheezes.  Abdominal: Soft. Bowel sounds are normal. He exhibits no distension. There is no tenderness.  Lymphadenopathy:  He has no cervical adenopathy.  WUJ:WJXB upper arm slight erythema and swelling Neurological: He is alert and oriented to person, place, and time.  Skin: Skin is warm and dry. No rash noted. No erythema.  Psychiatric: He has a normal mood and affect. His behavior is normal.   Lab Results Recent Labs    03/09/23 1554 03/10/23 1005  WBC 6.6 6.9  HGB 11.8* 10.0*  HCT 37.0* 31.1*  NA 135 134*  K 3.2* 2.8*  CL 94* 100  CO2 26 27  BUN 24* 14  CREATININE 5.28* 3.13*    Microbiology: 9/26 blood cx NGTD 9/24 blood cx MRSA in 2 sets And 1 set with MRSA and amp s e.faecalis Studies/Results: ECHOCARDIOGRAM COMPLETE  Result Date: 03/10/2023    ECHOCARDIOGRAM REPORT   Patient Name:   [name] [name] Date of Exam: 03/10/2023 Medical Rec #:  [id number]     Height:       70.0 in Accession #:    [id number]  Weight:       139.1 lb Date of Birth:  12/28/1960     BSA:          1.789 m Patient Age:    62 years      BP:           115/67 mmHg Patient Gender: M             HR:           83 bpm. Exam Location:  Inpatient Procedure: 2D Echo, Cardiac Doppler and Color Doppler Indications:    Endocarditis  History:        Patient has prior history of Echocardiogram examinations, most                 recent 12/25/2020. Cardiomyopathy, Pulmonary HTN; Risk                 Factors:Dyslipidemia, Hypertension and Diabetes.  Sonographer:    Milda Smart Referring Phys: [id number] [facility]  Sonographer Comments: Image acquisition challenging due to respiratory motion. IMPRESSIONS  1. Left ventricular ejection fraction, by estimation, is 55 to  60%. The left ventricle has normal function. The left ventricle has no regional wall motion abnormalities. Left ventricular diastolic parameters are consistent with Grade I diastolic dysfunction (impaired relaxation).  2. Right ventricular systolic function is normal. The right ventricular size is normal.  3. Anterior and posterior mitral valve leaflets are thickened compared with echo 12/2020. Concerning for endocarditis. The mitral valve is abnormal. No evidence of mitral valve regurgitation. No evidence of mitral stenosis.  4. The aortic valve is calcified and thickened. There is a mobile density on the left coronary cusp that was not present in the echo 12/2020. Consistent with endocarditis. The aortic valve is abnormal. There is mild calcification of the aortic valve. There is moderate thickening of the aortic valve. Aortic valve regurgitation is not visualized. No aortic stenosis is present.  5. The inferior vena cava is normal in size with greater than 50% respiratory variability, suggesting right atrial pressure of 3 mmHg. Conclusion(s)/Recommendation(s): Findings concerning for endocarditis, would recommend Transesophageal Echocardiogram for clarification. FINDINGS  Left Ventricle: Left ventricular ejection fraction, by estimation, is 55 to 60%. The left ventricle has normal function. The left ventricle has no regional wall motion abnormalities. The left ventricular internal cavity size was normal in size. There is  no left ventricular hypertrophy. Left ventricular diastolic parameters are consistent with Grade I diastolic dysfunction (impaired relaxation). Normal left ventricular filling pressure. Right Ventricle: The right ventricular size is normal. No increase in right ventricular wall thickness. Right ventricular systolic function is normal. Left Atrium: Left atrial size was normal in size. Right Atrium: Right atrial size was normal in size. Pericardium: There is no evidence of pericardial effusion.  Mitral Valve: Anterior and posterior mitral valve leaflets are thickened compared with echo 12/2020. Concerning for endocarditis. The mitral valve is abnormal. No evidence of mitral valve regurgitation. No evidence of mitral valve stenosis. MV peak gradient, 6.8 mmHg. The mean mitral valve gradient is 2.0 mmHg. Tricuspid Valve: The tricuspid valve is normal in structure. Tricuspid valve regurgitation is not demonstrated. No evidence of tricuspid stenosis. Aortic Valve: The aortic valve is calcified and thickened. There is a mobile density on the left coronary cusp that was not present in the echo 12/2020. Consistent with endocarditis. The aortic valve is abnormal. There is mild calcification of the aortic valve. There is moderate thickening of the aortic valve. Aortic valve regurgitation is  not visualized. No aortic stenosis is present. Pulmonic Valve: The pulmonic valve was normal in structure. Pulmonic valve regurgitation is not visualized. No evidence of pulmonic stenosis. Aorta: The aortic root is normal in size and structure. Venous: The inferior vena cava is normal in size with greater than 50% respiratory variability, suggesting right atrial pressure of 3 mmHg. IAS/Shunts: No atrial level shunt detected by color flow Doppler.  LEFT VENTRICLE PLAX 2D LVIDd:         3.50 cm     Diastology LVIDs:         3.10 cm     LV e' medial:    6.31 cm/s LV PW:         1.10 cm     LV E/e' medial:  9.6 LV IVS:        1.00 cm     LV e' lateral:   7.51 cm/s LVOT diam:     1.90 cm     LV E/e' lateral: 8.1 LVOT Area:     2.84 cm  LV Volumes (MOD) LV vol d, MOD A2C: 61.5 ml LV vol d, MOD A4C: 82.1 ml LV vol s, MOD A2C: 27.7 ml LV vol s, MOD A4C: 34.5 ml LV SV MOD A2C:     33.8 ml LV SV MOD A4C:     82.1 ml LV SV MOD BP:      42.8 ml RIGHT VENTRICLE RV Basal diam:  2.60 cm RV S prime:     12.80 cm/s TAPSE (M-mode): 1.7 cm LEFT ATRIUM             Index        RIGHT ATRIUM           Index LA diam:        3.40 cm 1.90 cm/m   RA Area:      12.40 cm LA Vol (A2C):   34.8 ml 19.45 ml/m  RA Volume:   24.70 ml  13.81 ml/m LA Vol (A4C):   32.4 ml 18.11 ml/m LA Biplane Vol: 34.1 ml 19.06 ml/m   AORTA Ao Root diam: 3.50 cm Ao Asc diam:  2.90 cm MITRAL VALVE MV Area (PHT): 1.84 cm     SHUNTS MV Peak grad:  6.8 mmHg     Systemic Diam: 1.90 cm MV Mean grad:  2.0 mmHg MV Vmax:       1.30 m/s MV Vmean:      71.3 cm/s MV Decel Time: 412 msec MV E velocity: 60.80 cm/s MV A velocity: 109.00 cm/s MV E/A ratio:  0.56 Chilton Si MD Electronically signed by Chilton Si MD Signature Date/Time: 03/10/2023/4:00:35 PM    Final      Assessment/Plan: MRSA/enterococcal bacteremia now with endocarditis = continue on vancomycin with HD will need TEE next week. Spoke with Dr Lenell Antu to plan for probable infected graft removal next week as well  Will follow up on blood cx to see he is clearing his bacteremia  Covid-19 = mild disease, received remdesivir course. Recommend 10 day isolation. Currently day 4 (but may need to readdress when his symptoms started )  ESRD on HD = continue with current schedule and dose vancomycin towards end of session    [facility] Pager: [phone]  03/10/2023, 4:39 PM

## 2023-03-10 NOTE — Progress Notes (Signed)
  Echocardiogram 2D Echocardiogram has been performed.  Milda Smart 03/10/2023, 3:32 PM

## 2023-03-10 NOTE — Progress Notes (Signed)
Due to patient not receiving HD this evening, called Pharmacy to confirm skipping dose Vanc for tonight and resume admin tomorrow after HD.

## 2023-03-10 NOTE — Progress Notes (Signed)
PT Cancellation Note  Patient Details Name: [name] [name] MRN: [id number] DOB: 05-23-1961   Cancelled Treatment:    Reason Eval/Treat Not Completed: (P) Patient at procedure or test/unavailable (attempt AM - pt having HD in his room; attempt 1500 - pt having Echo in room.) Will continue efforts per PT plan of care as schedule permits.  [name] [name] 03/10/2023, 3:28 PM

## 2023-03-10 NOTE — Progress Notes (Addendum)
PROGRESS NOTE    [name] [name]  WUJ:[id number] DOB: 03/17/1961 DOA: 03/06/2023 PCP: Farris Has, MD   Brief Narrative:    [name] [name] is a 62 y.o. male with medical history significant for ESRD on HD MWF, hypertension, type 2 diabetes, pulmonary hypertension, cardiomyopathy, GERD, dyslipidemia, and anemia of CKD who presented to the ED with worsening mental status.  He was admitted with acute metabolic encephalopathy secondary to UTI/COVID-pneumonia as well as missed hemodialysis sessions.  Patient is now noted to have MRSA and enterococcal bacteremia and has been started on vancomycin.  Appreciate ID evaluation with recommendations for transfer to [facility] for fistula removal.  Vascular surgery notified and will assess upon arrival.  Plan is for hemodialysis today.  Assessment & Plan:   MRSA and Enterococcus bacteremia No significant findings on ultrasound of fistula or CT maxillofacial ID is following.  Patient is currently on vancomycin.  Concern was for infection in the AV graft for his dialysis.  Transferred to [facility] for evaluation by vascular surgery.  Seen by vascular surgery.  They plan to discuss with ID as they do not feel that a complete excision is necessary.    COVID infection -Remdesivir course completed -Has not required steroids -Continue isolation precautions Respiratory status is stable.  ESRD on HD MWF Hemodialysis per nephrology  Acute metabolic encephalopathy Likely secondary to UTI/COVID and missed HD, and bacteremia.  Mentation has improved.  No focal neurological deficits noted.   Anemia of chronic kidney disease-stable -Currently stable, hold ESA for now   Hypertension -Continue home blood pressure medications   Type 2 diabetes -Currently without hyperglycemia -Maintain on SSI every 4 hours -Carb modified diet -Avoid gabapentin for neuropathy given current altered mentation   Hypothyroidism -Continue levothyroxine and TSH 0.588  with T4 1.47 -Levothyroxine dose decreased to 25 mcg daily   GERD -PPI   Thrombocytopenia Noted to be downward trending but better as of 9/26.  Likely due to sepsis.  Follow on a daily basis for now. Holding heparin products.   DVT prophylaxis: SCDs Code Status: Full Family Communication: No family at bedside. Disposition Plan: PT and OT evaluation.    Consultants:  Nephrology ID Vascular-Dr. Lenell Antu  Procedures:  None  Subjective: Patient is slow to respond but denies any complaints this morning.  Denies any cough or shortness of breath.  He has poor mobility at baseline.  Uses walker at home and has to use electric wheelchair when he gets out of the house.    Objective: Vitals:   03/09/23 2027 03/10/23 0617 03/10/23 0754 03/10/23 0818  BP: (!) 167/70 (!) 179/69 (!) 170/75 (!) 157/88  Pulse: 68 (!) 57 65 72  Resp: 19 (!) 21 18 18   Temp: 97.7 F (36.5 C) 97.9 F (36.6 C) 97.6 F (36.4 C) 97.7 F (36.5 C)  TempSrc:    Oral  SpO2: 98% 100% 100% 100%  Weight:      Height:        Intake/Output Summary (Last 24 hours) at 03/10/2023 0911 Last data filed at 03/10/2023 0651 Gross per 24 hour  Intake 480 ml  Output 0 ml  Net 480 ml   Filed Weights   03/06/23 0947 03/07/23 1140 03/07/23 1600  Weight: 78 kg 63.8 kg 63.1 kg    Examination:   General appearance: Awake alert.  In no distress Resp: Clear to auscultation bilaterally.  Normal effort Cardio: S1-S2 is normal regular.  No S3-S4.  No rubs murmurs or bruit GI: Abdomen is  soft.  Nontender nondistended.  Bowel sounds are present normal.  No masses organomegaly Extremities: No edema.  Physical deconditioning is noted.   Data Reviewed: I have personally reviewed following labs and imaging studies  CBC: Recent Labs  Lab 03/06/23 1017 03/06/23 1026 03/07/23 0437 03/08/23 0542 03/09/23 1554  WBC  --  5.3 3.3* 6.0 6.6  NEUTROABS  --  4.0  --   --   --   HGB 14.3 13.4 10.4* 11.6* 11.8*  HCT 42.0 41.9 34.1*  37.1* 37.0*  MCV  --  89.5 90.7 91.2 87.7  PLT  --  99* 76* 61* 86*   Basic Metabolic Panel: Recent Labs  Lab 03/06/23 1017 03/06/23 1026 03/07/23 0437 03/08/23 0542 03/09/23 1554  NA 138 137 137 136 135  K 3.2* 3.0* 2.8* 3.6 3.2*  CL 100 96* 100 99 94*  CO2  --  27 26 25 26   GLUCOSE 99 99 111* 87 148*  BUN 20 18 25* 15 24*  CREATININE 6.90* 5.98* 6.81* 4.18* 5.28*  CALCIUM  --  7.9* 7.2* 7.2* 7.1*  MG  --   --  1.8 1.8 1.7   GFR: Estimated Creatinine Clearance: 12.6 mL/min (A) (by C-G formula based on SCr of 5.28 mg/dL (H)). Liver Function Tests: Recent Labs  Lab 03/06/23 1026  AST 21  ALT 17  ALKPHOS 154*  BILITOT 1.6*  PROT 6.5  ALBUMIN 2.6*    Recent Labs  Lab 03/06/23 1026  AMMONIA 15   Coagulation Profile: Recent Labs  Lab 03/06/23 1026  INR 1.1   CBG: Recent Labs  Lab 03/09/23 0740 03/09/23 1146 03/09/23 1625 03/09/23 2029 03/10/23 0751  GLUCAP 114* 137* 140* 199* 113*    Sepsis Labs: Recent Labs  Lab 03/06/23 1026  PROCALCITON 2.13  LATICACIDVEN 1.2    Recent Results (from the past 240 hour(s))  SARS Coronavirus 2 by RT PCR (hospital order, performed in [facility] hospital lab) *cepheid single result test* Anterior Nasal Swab     Status: Abnormal   Collection Time: 03/06/23 10:36 AM   Specimen: Anterior Nasal Swab  Result Value Ref Range Status   SARS Coronavirus 2 by RT PCR POSITIVE (A) NEGATIVE Final    Comment: (NOTE) SARS-CoV-2 target nucleic acids are DETECTED  SARS-CoV-2 RNA is generally detectable in upper respiratory specimens  during the acute phase of infection.  Positive results are indicative  of the presence of the identified virus, but do not rule out bacterial infection or co-infection with other pathogens not detected by the test.  Clinical correlation with patient history and  other diagnostic information is necessary to determine patient infection status.  The expected result is negative.  Fact Sheet for  Patients:   RoadLapTop.co.za   Fact Sheet for Healthcare Providers:   http://kim-miller.com/    This test is not yet approved or cleared by the [place] FDA and  has been authorized for detection and/or diagnosis of SARS-CoV-2 by FDA under an Emergency Use Authorization (EUA).  This EUA will remain in effect (meaning this test can be used) for the duration of  the COVID-19 declaration under Section 564(b)(1)  of the Act, 21 U.S.C. section 360-bbb-3(b)(1), unless the authorization is terminated or revoked sooner.   Performed at [facility], [address] [place], [place] [address]   Culture, blood (routine x 2)     Status: Abnormal   Collection Time: 03/06/23 11:35 AM   Specimen: BLOOD  Result Value Ref Range Status  Specimen Description   Final    BLOOD BLOOD RIGHT ARM Performed at [facility], [address], [place], [place] [address]    Special Requests   Final    BOTTLES DRAWN AEROBIC ONLY Blood Culture results may not be optimal due to an inadequate volume of blood received in culture bottles Performed at [facility], [address] [place], [place] [address]    Culture  Setup Time   Final    GRAM POSITIVE COCCI AEROBIC BOTTLE ONLY Gram Stain Report Called to,Read Back By and Verified With: DILDY,V. ON 03/07/2023 AT 1140 BY FRATTO,A. Performed at [facility], [address] [place], [place] [address]    Culture (A)  Final    STAPHYLOCOCCUS AUREUS SUSCEPTIBILITIES PERFORMED ON PREVIOUS CULTURE WITHIN THE LAST 5 DAYS. STAPHYLOCOCCUS CAPITIS THE SIGNIFICANCE OF ISOLATING THIS ORGANISM FROM A SINGLE SET OF BLOOD CULTURES WHEN MULTIPLE SETS ARE DRAWN IS UNCERTAIN. PLEASE NOTIFY THE MICROBIOLOGY DEPARTMENT WITHIN ONE WEEK IF SPECIATION AND SENSITIVITIES ARE REQUIRED. Performed at [facility], [address] [place], [place] [address]    Report Status 03/10/2023 FINAL  Final  Culture, blood (routine x 2)      Status: Abnormal   Collection Time: 03/06/23 11:35 AM   Specimen: BLOOD  Result Value Ref Range Status   Specimen Description   Final    BLOOD BLOOD LEFT HAND Performed at [facility], [address] [place], [place] [address]    Special Requests   Final    BOTTLES DRAWN AEROBIC ONLY Blood Culture adequate volume Performed at [facility], [address], [place], [place] [address]    Culture  Setup Time   Final    GRAM POSITIVE COCCI AEROBIC BOTTLE ONLY CRITICAL RESULT CALLED TO, READ BACK BY AND VERIFIED WITH: [facility] WEAVER @ 5784 ON 03/07/23 C VARNER CRITICAL RESULT CALLED TO, READ BACK BY AND VERIFIED WITH: Dorann Lodge MENDENHALL [id number] AT 1019 BY CM Performed at [facility], [address], [place], [place] [address]    Culture (A)  Final    METHICILLIN RESISTANT STAPHYLOCOCCUS AUREUS ENTEROCOCCUS FAECALIS    Report Status 03/10/2023 FINAL  Final   Organism ID, Bacteria METHICILLIN RESISTANT STAPHYLOCOCCUS AUREUS  Final   Organism ID, Bacteria ENTEROCOCCUS FAECALIS  Final      Susceptibility   Enterococcus faecalis - MIC*    AMPICILLIN <=2 SENSITIVE Sensitive     VANCOMYCIN 1 SENSITIVE Sensitive     GENTAMICIN SYNERGY SENSITIVE Sensitive     * ENTEROCOCCUS FAECALIS   Methicillin resistant staphylococcus aureus - MIC*    CIPROFLOXACIN >=8 RESISTANT Resistant     ERYTHROMYCIN >=8 RESISTANT Resistant     GENTAMICIN <=0.5 SENSITIVE Sensitive     OXACILLIN >=4 RESISTANT Resistant     TETRACYCLINE <=1 SENSITIVE Sensitive     VANCOMYCIN 1 SENSITIVE Sensitive     TRIMETH/SULFA >=320 RESISTANT Resistant     CLINDAMYCIN <=0.25 SENSITIVE Sensitive     RIFAMPIN <=0.5 SENSITIVE Sensitive     Inducible Clindamycin NEGATIVE Sensitive     LINEZOLID 2 SENSITIVE Sensitive     * METHICILLIN RESISTANT STAPHYLOCOCCUS AUREUS  Blood Culture ID Panel (Reflexed)     Status: Abnormal   Collection Time: 03/06/23 11:35 AM  Result Value Ref Range Status   Enterococcus faecalis DETECTED (A)  NOT DETECTED Final    Comment: CRITICAL RESULT CALLED TO, READ BACK BY AND VERIFIED WITH: PHARMD J MENEDENHALL [id number] AT 1019 BY CM    Enterococcus Faecium  NOT DETECTED NOT DETECTED Final   Listeria monocytogenes NOT DETECTED NOT DETECTED Final   Staphylococcus species DETECTED (A) NOT DETECTED Final    Comment: CRITICAL RESULT CALLED TO, READ BACK BY AND VERIFIED WITH: PHARMD J MENDENHALL [id number] AT 1019 BY CM    Staphylococcus aureus (BCID) DETECTED (A) NOT DETECTED Final    Comment: Methicillin (oxacillin)-resistant Staphylococcus aureus (MRSA). MRSA is predictably resistant to beta-lactam antibiotics (except ceftaroline). Preferred therapy is vancomycin unless clinically contraindicated. Patient requires contact precautions if  hospitalized. CRITICAL RESULT CALLED TO, READ BACK BY AND VERIFIED WITH: PHARMD J MENEDENHALL [id number] AT 1019 BY CM    Staphylococcus epidermidis NOT DETECTED NOT DETECTED Final   Staphylococcus lugdunensis NOT DETECTED NOT DETECTED Final   Streptococcus species NOT DETECTED NOT DETECTED Final   Streptococcus agalactiae NOT DETECTED NOT DETECTED Final   Streptococcus pneumoniae NOT DETECTED NOT DETECTED Final   Streptococcus pyogenes NOT DETECTED NOT DETECTED Final   A.calcoaceticus-baumannii NOT DETECTED NOT DETECTED Final   Bacteroides fragilis NOT DETECTED NOT DETECTED Final   Enterobacterales NOT DETECTED NOT DETECTED Final   Enterobacter cloacae complex NOT DETECTED NOT DETECTED Final   Escherichia coli NOT DETECTED NOT DETECTED Final   Klebsiella aerogenes NOT DETECTED NOT DETECTED Final   Klebsiella oxytoca NOT DETECTED NOT DETECTED Final   Klebsiella pneumoniae NOT DETECTED NOT DETECTED Final   Proteus species NOT DETECTED NOT DETECTED Final   Salmonella species NOT DETECTED NOT DETECTED Final   Serratia marcescens NOT DETECTED NOT DETECTED Final   Haemophilus influenzae NOT DETECTED NOT DETECTED Final   Neisseria meningitidis NOT DETECTED NOT  DETECTED Final   Pseudomonas aeruginosa NOT DETECTED NOT DETECTED Final   Stenotrophomonas maltophilia NOT DETECTED NOT DETECTED Final   Candida albicans NOT DETECTED NOT DETECTED Final   Candida auris NOT DETECTED NOT DETECTED Final   Candida glabrata NOT DETECTED NOT DETECTED Final   Candida krusei NOT DETECTED NOT DETECTED Final   Candida parapsilosis NOT DETECTED NOT DETECTED Final   Candida tropicalis NOT DETECTED NOT DETECTED Final   Cryptococcus neoformans/gattii NOT DETECTED NOT DETECTED Final   Meth resistant mecA/C and MREJ DETECTED (A) NOT DETECTED Final    Comment: CRITICAL RESULT CALLED TO, READ BACK BY AND VERIFIED WITH: PHARMD J MENEDENHALL [id number] AT 1019 BY CM    Vancomycin resistance NOT DETECTED NOT DETECTED Final    Comment: Performed at [facility], [address] [place], [place] [address]  MRSA Next Gen by PCR, Nasal     Status: Abnormal   Collection Time: 03/06/23  6:44 PM   Specimen: Nasal Mucosa; Nasal Swab  Result Value Ref Range Status   MRSA by PCR Next Gen DETECTED (A) NOT DETECTED Final    Comment: RESULT CALLED TO, READ BACK BY AND VERIFIED WITH: [facility] BEAVER 2104 03/06/23 BY VIRAY,J (NOTE) The GeneXpert MRSA Assay (FDA approved for NASAL specimens only), is one component of a comprehensive MRSA colonization surveillance program. It is not intended to diagnose MRSA infection nor to guide or monitor treatment for MRSA infections. Test performance is not FDA approved in patients less than 75 years old. Performed at [facility], [address], [place], [place] [address]   Culture, blood (Routine X 2) w Reflex to ID Panel     Status: Abnormal   Collection Time: 03/07/23 12:08 PM   Specimen: BLOOD  Result Value Ref Range Status   Specimen Description   Final    BLOOD ARTERIAL DRAW Performed at [facility],  [address], [place], [place] [address]    Special Requests   Final    BOTTLES DRAWN AEROBIC AND ANAEROBIC Blood Culture results may  not be optimal due to an excessive volume of blood received in culture bottles Performed at [facility], [address], [place], [place] [address]    Culture  Setup Time   Final    GRAM POSITIVE COCCI IN BOTH AEROBIC AND ANAEROBIC BOTTLES CRITICAL RESULT CALLED TO, READ BACK BY AND VERIFIED WITH: K.THOMAS ON 03/08/2023 @14 :54 BY FRATTO,A  CRITICAL VALUE NOTED.  VALUE IS CONSISTENT WITH PREVIOUSLY REPORTED AND CALLED VALUE.    Culture (A)  Final    STAPHYLOCOCCUS AUREUS SUSCEPTIBILITIES PERFORMED ON PREVIOUS CULTURE WITHIN THE LAST 5 DAYS. Performed at [facility], [address], [place], [place] [address]    Report Status 03/10/2023 FINAL  Final  Culture, blood (Routine X 2) w Reflex to ID Panel     Status: Abnormal   Collection Time: 03/07/23 12:24 PM   Specimen: BLOOD  Result Value Ref Range Status   Specimen Description   Final    BLOOD ARTERIAL DRAW Performed at [facility], [address] [place], [place] [address]    Special Requests   Final    BOTTLES DRAWN AEROBIC AND ANAEROBIC Blood Culture results may not be optimal due to an excessive volume of blood received in culture bottles Performed at [facility], [address] [place], [place] [address]    Culture  Setup Time   Final    GRAM POSITIVE COCCI IN BOTH AEROBIC AND ANAEROBIC BOTTLES CRITICAL RESULT CALLED TO, READ BACK BY AND VERIFIED WITH: B.DILDI ON 03/08/2023 @12 :27 BY T.HAMER CRITICAL VALUE NOTED.  VALUE IS CONSISTENT WITH PREVIOUSLY REPORTED AND CALLED VALUE.    Culture (A)  Final    STAPHYLOCOCCUS AUREUS SUSCEPTIBILITIES PERFORMED ON PREVIOUS CULTURE WITHIN THE LAST 5 DAYS. Performed at [facility], [address], [place], [place] [address]    Report Status 03/10/2023 FINAL  Final  Culture, blood (Routine X 2) w Reflex to ID Panel     Status: None (Preliminary result)   Collection Time: 03/09/23  3:54 PM   Specimen: BLOOD RIGHT ARM  Result Value Ref Range Status   Specimen Description  BLOOD RIGHT ARM  Final   Special Requests   Final    BOTTLES DRAWN AEROBIC ONLY Blood Culture results may not be optimal due to an excessive volume of blood received in culture bottles   Culture   Final    NO GROWTH < 24 HOURS Performed at [facility], [address], [place], [place] [address]    Report Status PENDING  Incomplete  Culture, blood (Routine X 2) w Reflex to ID Panel     Status: None (Preliminary result)   Collection Time: 03/09/23  3:54 PM   Specimen: BLOOD RIGHT ARM  Result Value Ref Range Status   Specimen Description BLOOD RIGHT ARM  Final   Special Requests   Final    BOTTLES DRAWN AEROBIC AND ANAEROBIC Blood Culture adequate volume   Culture   Final    NO GROWTH < 24 HOURS Performed at [facility], [address] [place], [place] [address]    Report Status PENDING  Incomplete         Radiology Studies: CT MAXILLOFACIAL WO CONTRAST  Result Date: 03/08/2023 CLINICAL DATA:  Abscess, MRSA bacteremia and E fascia talus bacteremia and hemodialysis patient with AV graft. Possible  cavitation, saw a dentist ovary year ago. EXAM: CT MAXILLOFACIAL WITHOUT CONTRAST TECHNIQUE: Multidetector CT imaging of the maxillofacial structures was performed. Multiplanar CT image reconstructions were also generated. RADIATION DOSE REDUCTION: This exam was performed according to the departmental dose-optimization program which includes automated exposure control, adjustment of the mA and/or kV according to patient size and/or use of iterative reconstruction technique. COMPARISON:  03/06/2023. FINDINGS: Osseous: No acute fracture. There is partial opacification of the mastoid air cells bilaterally, unchanged from the previous exam. Poor dentition is noted with multiple cavitations and periapical lucencies involving the mandible and maxilla Orbits: No traumatic or inflammatory finding. Sinuses: Mucosal thickening is present in the maxillary sinuses bilaterally in the sphenoid  sinus on the left. There is partial opacification of the ethmoid air cells on the left. No air-fluid levels are seen. Soft tissues: No acute abnormality. No focal fluid collection is seen to suggest abscess. Examination for infection is limited due to lack of IV contrast. Limited intracranial: Diffuse cerebral atrophy is noted. Periventricular white matter hypodensities are present bilaterally. No hydrocephalus. IMPRESSION: 1. No focal fluid collection to suggest abscess. 2. Poor dentition with multiple cavitations and periapical lucencies. 3. Chronic sinus disease. Electronically Signed   By: Thornell Sartorius M.D.   On: 03/08/2023 20:26   [place] LT UPPER EXTREM LTD SOFT TISSUE NON VASCULAR  Result Date: 03/08/2023 CLINICAL DATA:  Infection suspected.  Hemodialysis patient. EXAM: ULTRASOUND LEFT UPPER EXTREMITY LIMITED TECHNIQUE: Ultrasound examination of the upper extremity soft tissues was performed in the area of clinical concern. COMPARISON:  None of the LEFT upper extremity. Patient had RIGHT elbow radiographs done 03/06/2023. Chest CT 03/06/2023. FINDINGS: Examination of the left upper arm was performed targeted to the patient's hemodialysis graft. The graft is patent without evidence of thrombosis or surrounding focal fluid collection. Mild surrounding soft tissue edema noted without hypervascularity on Doppler ultrasound. IMPRESSION: 1. Mild nonspecific edema in the left upper arm. 2. No focal fluid collection to suggest abscess. 3. Patent hemodialysis vascular shunt. Electronically Signed   By: Carey Bullocks M.D.   On: 03/08/2023 19:04        Scheduled Meds:  amLODipine  5 mg Oral q morning   atorvastatin  20 mg Oral q morning   Chlorhexidine Gluconate Cloth  6 each Topical Q0600   darbepoetin (ARANESP) injection - DIALYSIS  60 mcg Subcutaneous Q Wed-1800   feeding supplement (NEPRO CARB STEADY)  237 mL Oral TID BM   insulin aspart  0-5 Units Subcutaneous QHS   insulin aspart  0-9 Units  Subcutaneous TID WC   levothyroxine  25 mcg Oral QAC breakfast   metoprolol tartrate  50 mg Oral BID   multivitamin with minerals  1 tablet Oral Daily   pantoprazole  40 mg Oral Daily   sodium chloride flush  3 mL Intravenous Q12H   Continuous Infusions:  sodium chloride     vancomycin Stopped (03/09/23 2216)   vancomycin       LOS: 4 days    Osvaldo Shipper,  [facility]  If 7PM-7AM, please contact night-coverage [email or web address] 03/10/2023, 9:11 AM

## 2023-03-11 DIAGNOSIS — D696 Thrombocytopenia, unspecified: Secondary | ICD-10-CM | POA: Diagnosis not present

## 2023-03-11 DIAGNOSIS — D638 Anemia in other chronic diseases classified elsewhere: Secondary | ICD-10-CM

## 2023-03-11 DIAGNOSIS — R7881 Bacteremia: Secondary | ICD-10-CM | POA: Diagnosis not present

## 2023-03-11 DIAGNOSIS — U071 COVID-19: Secondary | ICD-10-CM | POA: Diagnosis not present

## 2023-03-11 LAB — BASIC METABOLIC PANEL
Anion gap: 11 (ref 5–15)
BUN: 20 mg/dL (ref 8–23)
CO2: 27 mmol/L (ref 22–32)
Calcium: 7 mg/dL — ABNORMAL LOW (ref 8.9–10.3)
Chloride: 96 mmol/L — ABNORMAL LOW (ref 98–111)
Creatinine, Ser: 4.56 mg/dL — ABNORMAL HIGH (ref 0.61–1.24)
GFR, Estimated: 14 mL/min — ABNORMAL LOW (ref >60)
Glucose, Bld: 140 mg/dL — ABNORMAL HIGH (ref 70–99)
Potassium: 2.9 mmol/L — ABNORMAL LOW (ref 3.5–5.1)
Sodium: 134 mmol/L — ABNORMAL LOW (ref 135–145)

## 2023-03-11 LAB — GLUCOSE, CAPILLARY
Glucose-Capillary: 104 mg/dL — ABNORMAL HIGH (ref 70–99)
Glucose-Capillary: 129 mg/dL — ABNORMAL HIGH (ref 70–99)
Glucose-Capillary: 98 mg/dL (ref 70–99)

## 2023-03-11 MED ORDER — INFLUENZA VIRUS VACC SPLIT PF (FLUZONE) 0.5 ML IM SUSY
0.5000 mL | PREFILLED_SYRINGE | INTRAMUSCULAR | Status: DC
  Filled 2023-03-11: qty 0.5

## 2023-03-11 MED ORDER — ZINC OXIDE 40 % EX OINT
TOPICAL_OINTMENT | Freq: Every day | CUTANEOUS | Status: DC
  Filled 2023-03-11 (×2): qty 57

## 2023-03-11 MED ORDER — POTASSIUM CHLORIDE CRYS ER 20 MEQ PO TBCR
30.0000 meq | EXTENDED_RELEASE_TABLET | Freq: Once | ORAL | Status: AC
  Administered 2023-03-11: 30 meq via ORAL
  Filled 2023-03-11: qty 1

## 2023-03-11 NOTE — Progress Notes (Signed)
PROGRESS NOTE    [name] [name]  NFA:[id number] DOB: 05-05-1961 DOA: 03/06/2023 PCP: Farris Has, MD   Brief Narrative:    [name] [name] is a 62 y.o. male with medical history significant for ESRD on HD MWF, hypertension, type 2 diabetes, pulmonary hypertension, cardiomyopathy, GERD, dyslipidemia, and anemia of CKD who presented to the ED with worsening mental status.  He was admitted with acute metabolic encephalopathy secondary to UTI/COVID-pneumonia as well as missed hemodialysis sessions.  Patient is now noted to have MRSA and enterococcal bacteremia and has been started on vancomycin.  Appreciate ID evaluation with recommendations for transfer to [facility] for fistula removal.  Vascular surgery notified and will assess upon arrival.  Plan is for hemodialysis today.  Assessment & Plan:   MRSA and Enterococcus bacteremia No significant findings on ultrasound of fistula or CT maxillofacial ID is following.  Patient is currently on vancomycin.   Concern was for infection in the AV graft for his dialysis.  Transferred to [facility] for evaluation by vascular surgery.  Seen by vascular surgery.  Plan is for excision of the graft next week.   Echocardiogram raised concern for endocarditis.  TEE is being pursued.     COVID infection -Remdesivir course completed -Has not required steroids -Continue isolation precautions Respiratory status is stable.  He tested positive on 9/23.  Day 10 will be 10/3.  ESRD on HD MWF Hemodialysis per nephrology  Acute metabolic encephalopathy Likely secondary to UTI/COVID and missed HD, and bacteremia.  Mentation has improved.  No focal neurological deficits noted.   Anemia of chronic kidney disease -Currently stable, hold ESA for now   Hypertension -Continue home blood pressure medications   Type 2 diabetes -Currently without hyperglycemia -Maintain on SSI every 4 hours -Carb modified diet -Avoid gabapentin for neuropathy given current  altered mentation   Hypothyroidism -Continue levothyroxine and TSH 0.588 with T4 1.47 -Levothyroxine dose decreased to 25 mcg daily   GERD -PPI   Thrombocytopenia Platelet count is noted to be stable.  No evidence of overt bleeding.  Heparin products on hold.  Recheck labs tomorrow.     DVT prophylaxis: SCDs Code Status: Full Family Communication: No family at bedside. Disposition Plan: PT and OT evaluation.    Consultants:  Nephrology ID Vascular-Dr. Lenell Antu  Procedures:  None  Subjective: Patient denies any complaints this morning.  He was informed about the findings on the echocardiogram.  Objective: Vitals:   03/10/23 1410 03/10/23 1749 03/10/23 2108 03/11/23 0500  BP: 115/67 (!) 169/61 (!) 165/65 (!) 178/64  Pulse: 81 68 67 68  Resp:  14 17 16   Temp:  (!) 97 F (36.1 C) 98 F (36.7 C) (!) 97.5 F (36.4 C)  TempSrc:   Oral   SpO2: 100% 100% 100% 99%  Weight:      Height:        Intake/Output Summary (Last 24 hours) at 03/11/2023 0951 Last data filed at 03/11/2023 0759 Gross per 24 hour  Intake 629.9 ml  Output 1101 ml  Net -471.1 ml   Filed Weights   03/07/23 1600 03/10/23 0945 03/10/23 1330  Weight: 63.1 kg 63.1 kg 62 kg    Examination:  General appearance: Awake alert.  In no distress Resp: Clear to auscultation bilaterally.  Normal effort Cardio: S1-S2 is normal regular.  No S3-S4.  No rubs murmurs or bruit GI: Abdomen is soft.  Nontender nondistended.  Bowel sounds are present normal.  No masses organomegaly Extremities: No edema.  received in culture bottles Performed at [facility], [address] [place], [place] [address]    Culture  Setup Time   Final    GRAM POSITIVE COCCI IN BOTH AEROBIC AND ANAEROBIC BOTTLES CRITICAL RESULT CALLED TO, READ BACK BY AND VERIFIED WITH: K.THOMAS ON 03/08/2023 @14 :54 BY FRATTO,A  CRITICAL VALUE NOTED.  VALUE IS CONSISTENT WITH PREVIOUSLY REPORTED AND CALLED VALUE.    Culture (A)  Final    STAPHYLOCOCCUS AUREUS SUSCEPTIBILITIES PERFORMED ON PREVIOUS CULTURE WITHIN THE LAST 5 DAYS. Performed at [facility], [address] [place], [place] [address]    Report Status 03/10/2023 FINAL  Final  Culture, blood (Routine X 2) w Reflex to ID Panel     Status: Abnormal   Collection Time: 03/07/23 12:24 PM   Specimen: BLOOD  Result Value Ref Range Status   Specimen Description   Final    BLOOD ARTERIAL DRAW Performed at [facility], [address], [place], [place] [address]    Special Requests   Final    BOTTLES DRAWN AEROBIC AND ANAEROBIC Blood Culture results may not be optimal due to an excessive volume of blood received in culture bottles Performed at [facility], [address] [place], [place] [address]    Culture  Setup Time   Final    GRAM POSITIVE COCCI IN BOTH AEROBIC AND ANAEROBIC BOTTLES CRITICAL RESULT CALLED TO, READ BACK BY AND VERIFIED WITH: B.DILDI ON 03/08/2023 @12 :27 BY T.HAMER CRITICAL VALUE NOTED.  VALUE IS CONSISTENT WITH PREVIOUSLY REPORTED AND CALLED VALUE.    Culture (A)  Final    STAPHYLOCOCCUS AUREUS SUSCEPTIBILITIES PERFORMED ON PREVIOUS CULTURE WITHIN THE LAST 5 DAYS. Performed at [facility], [address], [place], [place] [address]    Report Status 03/10/2023 FINAL  Final  Culture, blood (Routine X 2) w Reflex to ID Panel     Status: None (Preliminary result)   Collection Time: 03/09/23  3:54 PM   Specimen: BLOOD RIGHT ARM  Result Value Ref Range Status   Specimen Description BLOOD RIGHT ARM  Final   Special Requests   Final    BOTTLES DRAWN AEROBIC ONLY Blood Culture results may  not be optimal due to an excessive volume of blood received in culture bottles   Culture   Final    NO GROWTH 2 DAYS Performed at [facility], [address], [place], [place] [address]    Report Status PENDING  Incomplete  Culture, blood (Routine X 2) w Reflex to ID Panel     Status: None (Preliminary result)   Collection Time: 03/09/23  3:54 PM   Specimen: BLOOD RIGHT ARM  Result Value Ref Range Status   Specimen Description BLOOD RIGHT ARM  Final   Special Requests   Final    BOTTLES DRAWN AEROBIC AND ANAEROBIC Blood Culture adequate volume   Culture   Final    NO GROWTH 2 DAYS Performed at [facility], [address] [place], [place] [address]    Report Status PENDING  Incomplete         Radiology Studies: ECHOCARDIOGRAM COMPLETE  Result Date: 03/10/2023    ECHOCARDIOGRAM REPORT   Patient Name:   [name] [name] Date of Exam: 03/10/2023 Medical Rec #:  [id number]     Height:       70.0 in Accession #:    [id number]    Weight:       139.1 lb Date of Birth:  07-27-1960  received in culture bottles Performed at [facility], [address] [place], [place] [address]    Culture  Setup Time   Final    GRAM POSITIVE COCCI IN BOTH AEROBIC AND ANAEROBIC BOTTLES CRITICAL RESULT CALLED TO, READ BACK BY AND VERIFIED WITH: K.THOMAS ON 03/08/2023 @14 :54 BY FRATTO,A  CRITICAL VALUE NOTED.  VALUE IS CONSISTENT WITH PREVIOUSLY REPORTED AND CALLED VALUE.    Culture (A)  Final    STAPHYLOCOCCUS AUREUS SUSCEPTIBILITIES PERFORMED ON PREVIOUS CULTURE WITHIN THE LAST 5 DAYS. Performed at [facility], [address] [place], [place] [address]    Report Status 03/10/2023 FINAL  Final  Culture, blood (Routine X 2) w Reflex to ID Panel     Status: Abnormal   Collection Time: 03/07/23 12:24 PM   Specimen: BLOOD  Result Value Ref Range Status   Specimen Description   Final    BLOOD ARTERIAL DRAW Performed at [facility], [address], [place], [place] [address]    Special Requests   Final    BOTTLES DRAWN AEROBIC AND ANAEROBIC Blood Culture results may not be optimal due to an excessive volume of blood received in culture bottles Performed at [facility], [address] [place], [place] [address]    Culture  Setup Time   Final    GRAM POSITIVE COCCI IN BOTH AEROBIC AND ANAEROBIC BOTTLES CRITICAL RESULT CALLED TO, READ BACK BY AND VERIFIED WITH: B.DILDI ON 03/08/2023 @12 :27 BY T.HAMER CRITICAL VALUE NOTED.  VALUE IS CONSISTENT WITH PREVIOUSLY REPORTED AND CALLED VALUE.    Culture (A)  Final    STAPHYLOCOCCUS AUREUS SUSCEPTIBILITIES PERFORMED ON PREVIOUS CULTURE WITHIN THE LAST 5 DAYS. Performed at [facility], [address], [place], [place] [address]    Report Status 03/10/2023 FINAL  Final  Culture, blood (Routine X 2) w Reflex to ID Panel     Status: None (Preliminary result)   Collection Time: 03/09/23  3:54 PM   Specimen: BLOOD RIGHT ARM  Result Value Ref Range Status   Specimen Description BLOOD RIGHT ARM  Final   Special Requests   Final    BOTTLES DRAWN AEROBIC ONLY Blood Culture results may  not be optimal due to an excessive volume of blood received in culture bottles   Culture   Final    NO GROWTH 2 DAYS Performed at [facility], [address], [place], [place] [address]    Report Status PENDING  Incomplete  Culture, blood (Routine X 2) w Reflex to ID Panel     Status: None (Preliminary result)   Collection Time: 03/09/23  3:54 PM   Specimen: BLOOD RIGHT ARM  Result Value Ref Range Status   Specimen Description BLOOD RIGHT ARM  Final   Special Requests   Final    BOTTLES DRAWN AEROBIC AND ANAEROBIC Blood Culture adequate volume   Culture   Final    NO GROWTH 2 DAYS Performed at [facility], [address] [place], [place] [address]    Report Status PENDING  Incomplete         Radiology Studies: ECHOCARDIOGRAM COMPLETE  Result Date: 03/10/2023    ECHOCARDIOGRAM REPORT   Patient Name:   [name] [name] Date of Exam: 03/10/2023 Medical Rec #:  [id number]     Height:       70.0 in Accession #:    [id number]    Weight:       139.1 lb Date of Birth:  07-27-1960  received in culture bottles Performed at [facility], [address] [place], [place] [address]    Culture  Setup Time   Final    GRAM POSITIVE COCCI IN BOTH AEROBIC AND ANAEROBIC BOTTLES CRITICAL RESULT CALLED TO, READ BACK BY AND VERIFIED WITH: K.THOMAS ON 03/08/2023 @14 :54 BY FRATTO,A  CRITICAL VALUE NOTED.  VALUE IS CONSISTENT WITH PREVIOUSLY REPORTED AND CALLED VALUE.    Culture (A)  Final    STAPHYLOCOCCUS AUREUS SUSCEPTIBILITIES PERFORMED ON PREVIOUS CULTURE WITHIN THE LAST 5 DAYS. Performed at [facility], [address] [place], [place] [address]    Report Status 03/10/2023 FINAL  Final  Culture, blood (Routine X 2) w Reflex to ID Panel     Status: Abnormal   Collection Time: 03/07/23 12:24 PM   Specimen: BLOOD  Result Value Ref Range Status   Specimen Description   Final    BLOOD ARTERIAL DRAW Performed at [facility], [address], [place], [place] [address]    Special Requests   Final    BOTTLES DRAWN AEROBIC AND ANAEROBIC Blood Culture results may not be optimal due to an excessive volume of blood received in culture bottles Performed at [facility], [address] [place], [place] [address]    Culture  Setup Time   Final    GRAM POSITIVE COCCI IN BOTH AEROBIC AND ANAEROBIC BOTTLES CRITICAL RESULT CALLED TO, READ BACK BY AND VERIFIED WITH: B.DILDI ON 03/08/2023 @12 :27 BY T.HAMER CRITICAL VALUE NOTED.  VALUE IS CONSISTENT WITH PREVIOUSLY REPORTED AND CALLED VALUE.    Culture (A)  Final    STAPHYLOCOCCUS AUREUS SUSCEPTIBILITIES PERFORMED ON PREVIOUS CULTURE WITHIN THE LAST 5 DAYS. Performed at [facility], [address], [place], [place] [address]    Report Status 03/10/2023 FINAL  Final  Culture, blood (Routine X 2) w Reflex to ID Panel     Status: None (Preliminary result)   Collection Time: 03/09/23  3:54 PM   Specimen: BLOOD RIGHT ARM  Result Value Ref Range Status   Specimen Description BLOOD RIGHT ARM  Final   Special Requests   Final    BOTTLES DRAWN AEROBIC ONLY Blood Culture results may  not be optimal due to an excessive volume of blood received in culture bottles   Culture   Final    NO GROWTH 2 DAYS Performed at [facility], [address], [place], [place] [address]    Report Status PENDING  Incomplete  Culture, blood (Routine X 2) w Reflex to ID Panel     Status: None (Preliminary result)   Collection Time: 03/09/23  3:54 PM   Specimen: BLOOD RIGHT ARM  Result Value Ref Range Status   Specimen Description BLOOD RIGHT ARM  Final   Special Requests   Final    BOTTLES DRAWN AEROBIC AND ANAEROBIC Blood Culture adequate volume   Culture   Final    NO GROWTH 2 DAYS Performed at [facility], [address] [place], [place] [address]    Report Status PENDING  Incomplete         Radiology Studies: ECHOCARDIOGRAM COMPLETE  Result Date: 03/10/2023    ECHOCARDIOGRAM REPORT   Patient Name:   [name] [name] Date of Exam: 03/10/2023 Medical Rec #:  [id number]     Height:       70.0 in Accession #:    [id number]    Weight:       139.1 lb Date of Birth:  07-27-1960  received in culture bottles Performed at [facility], [address] [place], [place] [address]    Culture  Setup Time   Final    GRAM POSITIVE COCCI IN BOTH AEROBIC AND ANAEROBIC BOTTLES CRITICAL RESULT CALLED TO, READ BACK BY AND VERIFIED WITH: K.THOMAS ON 03/08/2023 @14 :54 BY FRATTO,A  CRITICAL VALUE NOTED.  VALUE IS CONSISTENT WITH PREVIOUSLY REPORTED AND CALLED VALUE.    Culture (A)  Final    STAPHYLOCOCCUS AUREUS SUSCEPTIBILITIES PERFORMED ON PREVIOUS CULTURE WITHIN THE LAST 5 DAYS. Performed at [facility], [address] [place], [place] [address]    Report Status 03/10/2023 FINAL  Final  Culture, blood (Routine X 2) w Reflex to ID Panel     Status: Abnormal   Collection Time: 03/07/23 12:24 PM   Specimen: BLOOD  Result Value Ref Range Status   Specimen Description   Final    BLOOD ARTERIAL DRAW Performed at [facility], [address], [place], [place] [address]    Special Requests   Final    BOTTLES DRAWN AEROBIC AND ANAEROBIC Blood Culture results may not be optimal due to an excessive volume of blood received in culture bottles Performed at [facility], [address] [place], [place] [address]    Culture  Setup Time   Final    GRAM POSITIVE COCCI IN BOTH AEROBIC AND ANAEROBIC BOTTLES CRITICAL RESULT CALLED TO, READ BACK BY AND VERIFIED WITH: B.DILDI ON 03/08/2023 @12 :27 BY T.HAMER CRITICAL VALUE NOTED.  VALUE IS CONSISTENT WITH PREVIOUSLY REPORTED AND CALLED VALUE.    Culture (A)  Final    STAPHYLOCOCCUS AUREUS SUSCEPTIBILITIES PERFORMED ON PREVIOUS CULTURE WITHIN THE LAST 5 DAYS. Performed at [facility], [address], [place], [place] [address]    Report Status 03/10/2023 FINAL  Final  Culture, blood (Routine X 2) w Reflex to ID Panel     Status: None (Preliminary result)   Collection Time: 03/09/23  3:54 PM   Specimen: BLOOD RIGHT ARM  Result Value Ref Range Status   Specimen Description BLOOD RIGHT ARM  Final   Special Requests   Final    BOTTLES DRAWN AEROBIC ONLY Blood Culture results may  not be optimal due to an excessive volume of blood received in culture bottles   Culture   Final    NO GROWTH 2 DAYS Performed at [facility], [address], [place], [place] [address]    Report Status PENDING  Incomplete  Culture, blood (Routine X 2) w Reflex to ID Panel     Status: None (Preliminary result)   Collection Time: 03/09/23  3:54 PM   Specimen: BLOOD RIGHT ARM  Result Value Ref Range Status   Specimen Description BLOOD RIGHT ARM  Final   Special Requests   Final    BOTTLES DRAWN AEROBIC AND ANAEROBIC Blood Culture adequate volume   Culture   Final    NO GROWTH 2 DAYS Performed at [facility], [address] [place], [place] [address]    Report Status PENDING  Incomplete         Radiology Studies: ECHOCARDIOGRAM COMPLETE  Result Date: 03/10/2023    ECHOCARDIOGRAM REPORT   Patient Name:   [name] [name] Date of Exam: 03/10/2023 Medical Rec #:  [id number]     Height:       70.0 in Accession #:    [id number]    Weight:       139.1 lb Date of Birth:  07-27-1960  PROGRESS NOTE    [name] [name]  NFA:[id number] DOB: 05-05-1961 DOA: 03/06/2023 PCP: Farris Has, MD   Brief Narrative:    [name] [name] is a 62 y.o. male with medical history significant for ESRD on HD MWF, hypertension, type 2 diabetes, pulmonary hypertension, cardiomyopathy, GERD, dyslipidemia, and anemia of CKD who presented to the ED with worsening mental status.  He was admitted with acute metabolic encephalopathy secondary to UTI/COVID-pneumonia as well as missed hemodialysis sessions.  Patient is now noted to have MRSA and enterococcal bacteremia and has been started on vancomycin.  Appreciate ID evaluation with recommendations for transfer to [facility] for fistula removal.  Vascular surgery notified and will assess upon arrival.  Plan is for hemodialysis today.  Assessment & Plan:   MRSA and Enterococcus bacteremia No significant findings on ultrasound of fistula or CT maxillofacial ID is following.  Patient is currently on vancomycin.   Concern was for infection in the AV graft for his dialysis.  Transferred to [facility] for evaluation by vascular surgery.  Seen by vascular surgery.  Plan is for excision of the graft next week.   Echocardiogram raised concern for endocarditis.  TEE is being pursued.     COVID infection -Remdesivir course completed -Has not required steroids -Continue isolation precautions Respiratory status is stable.  He tested positive on 9/23.  Day 10 will be 10/3.  ESRD on HD MWF Hemodialysis per nephrology  Acute metabolic encephalopathy Likely secondary to UTI/COVID and missed HD, and bacteremia.  Mentation has improved.  No focal neurological deficits noted.   Anemia of chronic kidney disease -Currently stable, hold ESA for now   Hypertension -Continue home blood pressure medications   Type 2 diabetes -Currently without hyperglycemia -Maintain on SSI every 4 hours -Carb modified diet -Avoid gabapentin for neuropathy given current  altered mentation   Hypothyroidism -Continue levothyroxine and TSH 0.588 with T4 1.47 -Levothyroxine dose decreased to 25 mcg daily   GERD -PPI   Thrombocytopenia Platelet count is noted to be stable.  No evidence of overt bleeding.  Heparin products on hold.  Recheck labs tomorrow.     DVT prophylaxis: SCDs Code Status: Full Family Communication: No family at bedside. Disposition Plan: PT and OT evaluation.    Consultants:  Nephrology ID Vascular-Dr. Lenell Antu  Procedures:  None  Subjective: Patient denies any complaints this morning.  He was informed about the findings on the echocardiogram.  Objective: Vitals:   03/10/23 1410 03/10/23 1749 03/10/23 2108 03/11/23 0500  BP: 115/67 (!) 169/61 (!) 165/65 (!) 178/64  Pulse: 81 68 67 68  Resp:  14 17 16   Temp:  (!) 97 F (36.1 C) 98 F (36.7 C) (!) 97.5 F (36.4 C)  TempSrc:   Oral   SpO2: 100% 100% 100% 99%  Weight:      Height:        Intake/Output Summary (Last 24 hours) at 03/11/2023 0951 Last data filed at 03/11/2023 0759 Gross per 24 hour  Intake 629.9 ml  Output 1101 ml  Net -471.1 ml   Filed Weights   03/07/23 1600 03/10/23 0945 03/10/23 1330  Weight: 63.1 kg 63.1 kg 62 kg    Examination:  General appearance: Awake alert.  In no distress Resp: Clear to auscultation bilaterally.  Normal effort Cardio: S1-S2 is normal regular.  No S3-S4.  No rubs murmurs or bruit GI: Abdomen is soft.  Nontender nondistended.  Bowel sounds are present normal.  No masses organomegaly Extremities: No edema.  received in culture bottles Performed at [facility], [address] [place], [place] [address]    Culture  Setup Time   Final    GRAM POSITIVE COCCI IN BOTH AEROBIC AND ANAEROBIC BOTTLES CRITICAL RESULT CALLED TO, READ BACK BY AND VERIFIED WITH: K.THOMAS ON 03/08/2023 @14 :54 BY FRATTO,A  CRITICAL VALUE NOTED.  VALUE IS CONSISTENT WITH PREVIOUSLY REPORTED AND CALLED VALUE.    Culture (A)  Final    STAPHYLOCOCCUS AUREUS SUSCEPTIBILITIES PERFORMED ON PREVIOUS CULTURE WITHIN THE LAST 5 DAYS. Performed at [facility], [address] [place], [place] [address]    Report Status 03/10/2023 FINAL  Final  Culture, blood (Routine X 2) w Reflex to ID Panel     Status: Abnormal   Collection Time: 03/07/23 12:24 PM   Specimen: BLOOD  Result Value Ref Range Status   Specimen Description   Final    BLOOD ARTERIAL DRAW Performed at [facility], [address], [place], [place] [address]    Special Requests   Final    BOTTLES DRAWN AEROBIC AND ANAEROBIC Blood Culture results may not be optimal due to an excessive volume of blood received in culture bottles Performed at [facility], [address] [place], [place] [address]    Culture  Setup Time   Final    GRAM POSITIVE COCCI IN BOTH AEROBIC AND ANAEROBIC BOTTLES CRITICAL RESULT CALLED TO, READ BACK BY AND VERIFIED WITH: B.DILDI ON 03/08/2023 @12 :27 BY T.HAMER CRITICAL VALUE NOTED.  VALUE IS CONSISTENT WITH PREVIOUSLY REPORTED AND CALLED VALUE.    Culture (A)  Final    STAPHYLOCOCCUS AUREUS SUSCEPTIBILITIES PERFORMED ON PREVIOUS CULTURE WITHIN THE LAST 5 DAYS. Performed at [facility], [address], [place], [place] [address]    Report Status 03/10/2023 FINAL  Final  Culture, blood (Routine X 2) w Reflex to ID Panel     Status: None (Preliminary result)   Collection Time: 03/09/23  3:54 PM   Specimen: BLOOD RIGHT ARM  Result Value Ref Range Status   Specimen Description BLOOD RIGHT ARM  Final   Special Requests   Final    BOTTLES DRAWN AEROBIC ONLY Blood Culture results may  not be optimal due to an excessive volume of blood received in culture bottles   Culture   Final    NO GROWTH 2 DAYS Performed at [facility], [address], [place], [place] [address]    Report Status PENDING  Incomplete  Culture, blood (Routine X 2) w Reflex to ID Panel     Status: None (Preliminary result)   Collection Time: 03/09/23  3:54 PM   Specimen: BLOOD RIGHT ARM  Result Value Ref Range Status   Specimen Description BLOOD RIGHT ARM  Final   Special Requests   Final    BOTTLES DRAWN AEROBIC AND ANAEROBIC Blood Culture adequate volume   Culture   Final    NO GROWTH 2 DAYS Performed at [facility], [address] [place], [place] [address]    Report Status PENDING  Incomplete         Radiology Studies: ECHOCARDIOGRAM COMPLETE  Result Date: 03/10/2023    ECHOCARDIOGRAM REPORT   Patient Name:   [name] [name] Date of Exam: 03/10/2023 Medical Rec #:  [id number]     Height:       70.0 in Accession #:    [id number]    Weight:       139.1 lb Date of Birth:  07-27-1960  received in culture bottles Performed at [facility], [address] [place], [place] [address]    Culture  Setup Time   Final    GRAM POSITIVE COCCI IN BOTH AEROBIC AND ANAEROBIC BOTTLES CRITICAL RESULT CALLED TO, READ BACK BY AND VERIFIED WITH: K.THOMAS ON 03/08/2023 @14 :54 BY FRATTO,A  CRITICAL VALUE NOTED.  VALUE IS CONSISTENT WITH PREVIOUSLY REPORTED AND CALLED VALUE.    Culture (A)  Final    STAPHYLOCOCCUS AUREUS SUSCEPTIBILITIES PERFORMED ON PREVIOUS CULTURE WITHIN THE LAST 5 DAYS. Performed at [facility], [address] [place], [place] [address]    Report Status 03/10/2023 FINAL  Final  Culture, blood (Routine X 2) w Reflex to ID Panel     Status: Abnormal   Collection Time: 03/07/23 12:24 PM   Specimen: BLOOD  Result Value Ref Range Status   Specimen Description   Final    BLOOD ARTERIAL DRAW Performed at [facility], [address], [place], [place] [address]    Special Requests   Final    BOTTLES DRAWN AEROBIC AND ANAEROBIC Blood Culture results may not be optimal due to an excessive volume of blood received in culture bottles Performed at [facility], [address] [place], [place] [address]    Culture  Setup Time   Final    GRAM POSITIVE COCCI IN BOTH AEROBIC AND ANAEROBIC BOTTLES CRITICAL RESULT CALLED TO, READ BACK BY AND VERIFIED WITH: B.DILDI ON 03/08/2023 @12 :27 BY T.HAMER CRITICAL VALUE NOTED.  VALUE IS CONSISTENT WITH PREVIOUSLY REPORTED AND CALLED VALUE.    Culture (A)  Final    STAPHYLOCOCCUS AUREUS SUSCEPTIBILITIES PERFORMED ON PREVIOUS CULTURE WITHIN THE LAST 5 DAYS. Performed at [facility], [address], [place], [place] [address]    Report Status 03/10/2023 FINAL  Final  Culture, blood (Routine X 2) w Reflex to ID Panel     Status: None (Preliminary result)   Collection Time: 03/09/23  3:54 PM   Specimen: BLOOD RIGHT ARM  Result Value Ref Range Status   Specimen Description BLOOD RIGHT ARM  Final   Special Requests   Final    BOTTLES DRAWN AEROBIC ONLY Blood Culture results may  not be optimal due to an excessive volume of blood received in culture bottles   Culture   Final    NO GROWTH 2 DAYS Performed at [facility], [address], [place], [place] [address]    Report Status PENDING  Incomplete  Culture, blood (Routine X 2) w Reflex to ID Panel     Status: None (Preliminary result)   Collection Time: 03/09/23  3:54 PM   Specimen: BLOOD RIGHT ARM  Result Value Ref Range Status   Specimen Description BLOOD RIGHT ARM  Final   Special Requests   Final    BOTTLES DRAWN AEROBIC AND ANAEROBIC Blood Culture adequate volume   Culture   Final    NO GROWTH 2 DAYS Performed at [facility], [address] [place], [place] [address]    Report Status PENDING  Incomplete         Radiology Studies: ECHOCARDIOGRAM COMPLETE  Result Date: 03/10/2023    ECHOCARDIOGRAM REPORT   Patient Name:   [name] [name] Date of Exam: 03/10/2023 Medical Rec #:  [id number]     Height:       70.0 in Accession #:    [id number]    Weight:       139.1 lb Date of Birth:  07-27-1960

## 2023-03-11 NOTE — Plan of Care (Signed)
  Problem: Education: Goal: Knowledge of General Education information will improve Description: Including pain rating scale, medication(s)/side effects and non-pharmacologic comfort measures Outcome: Progressing   Problem: Health Behavior/Discharge Planning: Goal: Ability to manage health-related needs will improve Outcome: Progressing   Problem: Clinical Measurements: Goal: Ability to maintain clinical measurements within normal limits will improve Outcome: Progressing Goal: Will remain free from infection Outcome: Progressing Goal: Diagnostic test results will improve Outcome: Progressing Goal: Respiratory complications will improve Outcome: Progressing Goal: Cardiovascular complication will be avoided Outcome: Progressing   Problem: Activity: Goal: Risk for activity intolerance will decrease Outcome: Progressing   Problem: Nutrition: Goal: Adequate nutrition will be maintained Outcome: Progressing   Problem: Coping: Goal: Level of anxiety will decrease Outcome: Progressing   Problem: Elimination: Goal: Will not experience complications related to bowel motility Outcome: Progressing Goal: Will not experience complications related to urinary retention Outcome: Progressing   Problem: Pain Managment: Goal: General experience of comfort will improve Outcome: Progressing   Problem: Safety: Goal: Ability to remain free from injury will improve Outcome: Progressing   Problem: Skin Integrity: Goal: Risk for impaired skin integrity will decrease Outcome: Progressing   Problem: Education: Goal: Knowledge of risk factors and measures for prevention of condition will improve Outcome: Progressing   Problem: Coping: Goal: Psychosocial and spiritual needs will be supported Outcome: Progressing   Problem: Respiratory: Goal: Will maintain a patent airway Outcome: Progressing Goal: Complications related to the disease process, condition or treatment will be avoided or  minimized Outcome: Progressing   Problem: Education: Goal: Ability to describe self-care measures that may prevent or decrease complications (Diabetes Survival Skills Education) will improve Outcome: Progressing Goal: Individualized Educational Video(s) Outcome: Progressing   Problem: Coping: Goal: Ability to adjust to condition or change in health will improve Outcome: Progressing   Problem: Fluid Volume: Goal: Ability to maintain a balanced intake and output will improve Outcome: Progressing   Problem: Health Behavior/Discharge Planning: Goal: Ability to identify and utilize available resources and services will improve Outcome: Progressing Goal: Ability to manage health-related needs will improve Outcome: Progressing   Problem: Metabolic: Goal: Ability to maintain appropriate glucose levels will improve Outcome: Progressing   Problem: Nutritional: Goal: Maintenance of adequate nutrition will improve Outcome: Progressing Goal: Progress toward achieving an optimal weight will improve Outcome: Progressing   Problem: Skin Integrity: Goal: Risk for impaired skin integrity will decrease Outcome: Progressing   Problem: Tissue Perfusion: Goal: Adequacy of tissue perfusion will improve Outcome: Progressing   

## 2023-03-11 NOTE — Progress Notes (Signed)
Subjective: Seen in room in isolation, tolerated dialysis yesterday asking about when he is discharged, noted need for TEE.  Evidently he forgot discussion with admit team earlier turning this  Objective Vital signs in last 24 hours: Vitals:   03/10/23 1410 03/10/23 1749 03/10/23 2108 03/11/23 0500  BP: 115/67 (!) 169/61 (!) 165/65 (!) 178/64  Pulse: 81 68 67 68  Resp:  14 17 16   Temp:  (!) 97 F (36.1 C) 98 F (36.7 C) (!) 97.5 F (36.4 C)  TempSrc:   Oral   SpO2: 100% 100% 100% 99%  Weight:      Height:       Weight change:   Physical Exam: General: Chronically ill adult male, NAD on HD Heart: RRR no MRG Lungs: CTA bilaterally unlabored breathing Abdomen: NABS, soft NTND Extremities: No pedal edema Dialysis Access: LUA AVF positive bruit, bruising erythema noted nontender to touch   OP HD : [facility],, MWF.  4 hours.  EDW 64 kg.  LUE AVG 15-gauge.  Flow rates: 400/autoflow 1.5.  3K/2.5 calcium.  No heparin.  Mircera 75 mcg every 2 weeks (last dose 9/11).  Venofer 50 mg q. weekly. -HD on MWF schedule typically, off schedule yesterday, will do off schedule today and then back on regular schedule Friday - no reported access issues last rx   Problem/Plan: ESRD= HD MWF, was off schedule HD yesterday back on schedule 9/27.  Next HD Monday 9/30 K2.8 on added K bath yesterday check lab supplement if needed   COVID-19 PNA = on remdesivir on isolation /plan per admit   Altered mental status = not quite back to normal.  MRSA and ED faecalis bacteremia playing a role  MRSA and E faecalis bacteremia -ID following /on  vancomycin / ultrasound of AVG nonspecific  VVS  seeing  -Dr. Juanetta Gosling 9/27 noted plan to excise graft next week tentatively Wednesday  2D discussion for endocarditis.  Noted per admit TEE being pursued Possible AVG infection= see above , had  problems cannulating 9/27 but using 17 and successful Anemia of CKD -Hgb 10.0 continue ESA 60 q. Wednesday HD secondary  hyperparathyroidism -placed on binders, corrected calcium at goal and phosphorus HTN/volume -chest x-ray no excess volume (COVID-pneumonia picture) some hypertension improved with HD today, below EDW.  Lenny Pastel, PA-C [facility] Bee[facility] [phone] 03/11/2023,10:52 AM  LOS: 5 days   Labs: Basic Metabolic Panel: Recent Labs  Lab 03/08/23 0542 03/09/23 1554 03/10/23 1005  NA 136 135 134*  K 3.6 3.2* 2.8*  CL 99 94* 100  CO2 25 26 27   GLUCOSE 87 148* 113*  BUN 15 24* 14  CREATININE 4.18* 5.28* 3.13*  CALCIUM 7.2* 7.1* 6.8*   Liver Function Tests: Recent Labs  Lab 03/06/23 1026  AST 21  ALT 17  ALKPHOS 154*  BILITOT 1.6*  PROT 6.5  ALBUMIN 2.6*   No results for input(s): "LIPASE", "AMYLASE" in the last 168 hours. Recent Labs  Lab 03/06/23 1026  AMMONIA 15   CBC: Recent Labs  Lab 03/06/23 1026 03/07/23 0437 03/08/23 0542 03/09/23 1554 03/10/23 1005  WBC 5.3 3.3* 6.0 6.6 6.9  NEUTROABS 4.0  --   --   --   --   HGB 13.4 10.4* 11.6* 11.8* 10.0*  HCT 41.9 34.1* 37.1* 37.0* 31.1*  MCV 89.5 90.7 91.2 87.7 86.6  PLT 99* 76* 61* 86* 78*   Cardiac Enzymes: No results for input(s): "CKTOTAL", "CKMB", "CKMBINDEX", "TROPONINI" in the last 168 hours. CBG: Recent  Labs  Lab 03/10/23 0751 03/10/23 1418 03/10/23 1548 03/10/23 2107 03/11/23 0735  GLUCAP 113* 111* 137* 126* 104*    Studies/Results: ECHOCARDIOGRAM COMPLETE  Result Date: 03/10/2023    ECHOCARDIOGRAM REPORT   Patient Name:   [name] [name] Date of Exam: 03/10/2023 Medical Rec #:  [id number]     Height:       70.0 in Accession #:    [id number]    Weight:       139.1 lb Date of Birth:  [birth year]     BSA:          1.789 m Patient Age:    62 years      BP:           115/67 mmHg Patient Gender: M             HR:           83 bpm. Exam Location:  Inpatient Procedure: 2D Echo, Cardiac Doppler and Color Doppler Indications:    Endocarditis  History:        Patient has prior history of Echocardiogram  examinations, most                 recent 12/25/2020. Cardiomyopathy, Pulmonary HTN; Risk                 Factors:Dyslipidemia, Hypertension and Diabetes.  Sonographer:    Milda Smart Referring Phys: [id number] [facility]  Sonographer Comments: Image acquisition challenging due to respiratory motion. IMPRESSIONS  1. Left ventricular ejection fraction, by estimation, is 55 to 60%. The left ventricle has normal function. The left ventricle has no regional wall motion abnormalities. Left ventricular diastolic parameters are consistent with Grade I diastolic dysfunction (impaired relaxation).  2. Right ventricular systolic function is normal. The right ventricular size is normal.  3. Anterior and posterior mitral valve leaflets are thickened compared with echo 12/2020. Concerning for endocarditis. The mitral valve is abnormal. No evidence of mitral valve regurgitation. No evidence of mitral stenosis.  4. The aortic valve is calcified and thickened. There is a mobile density on the left coronary cusp that was not present in the echo 12/2020. Consistent with endocarditis. The aortic valve is abnormal. There is mild calcification of the aortic valve. There is moderate thickening of the aortic valve. Aortic valve regurgitation is not visualized. No aortic stenosis is present.  5. The inferior vena cava is normal in size with greater than 50% respiratory variability, suggesting right atrial pressure of 3 mmHg. Conclusion(s)/Recommendation(s): Findings concerning for endocarditis, would recommend Transesophageal Echocardiogram for clarification. FINDINGS  Left Ventricle: Left ventricular ejection fraction, by estimation, is 55 to 60%. The left ventricle has normal function. The left ventricle has no regional wall motion abnormalities. The left ventricular internal cavity size was normal in size. There is  no left ventricular hypertrophy. Left ventricular diastolic parameters are consistent with Grade I diastolic dysfunction  (impaired relaxation). Normal left ventricular filling pressure. Right Ventricle: The right ventricular size is normal. No increase in right ventricular wall thickness. Right ventricular systolic function is normal. Left Atrium: Left atrial size was normal in size. Right Atrium: Right atrial size was normal in size. Pericardium: There is no evidence of pericardial effusion. Mitral Valve: Anterior and posterior mitral valve leaflets are thickened compared with echo 12/2020. Concerning for endocarditis. The mitral valve is abnormal. No evidence of mitral valve regurgitation. No evidence of mitral valve stenosis. MV peak gradient, 6.8 mmHg. The mean mitral valve gradient is 2.0 mmHg. Tricuspid  Valve: The tricuspid valve is normal in structure. Tricuspid valve regurgitation is not demonstrated. No evidence of tricuspid stenosis. Aortic Valve: The aortic valve is calcified and thickened. There is a mobile density on the left coronary cusp that was not present in the echo 12/2020. Consistent with endocarditis. The aortic valve is abnormal. There is mild calcification of the aortic valve. There is moderate thickening of the aortic valve. Aortic valve regurgitation is not visualized. No aortic stenosis is present. Pulmonic Valve: The pulmonic valve was normal in structure. Pulmonic valve regurgitation is not visualized. No evidence of pulmonic stenosis. Aorta: The aortic root is normal in size and structure. Venous: The inferior vena cava is normal in size with greater than 50% respiratory variability, suggesting right atrial pressure of 3 mmHg. IAS/Shunts: No atrial level shunt detected by color flow Doppler.  LEFT VENTRICLE PLAX 2D LVIDd:         3.50 cm     Diastology LVIDs:         3.10 cm     LV e' medial:    6.31 cm/s LV PW:         1.10 cm     LV E/e' medial:  9.6 LV IVS:        1.00 cm     LV e' lateral:   7.51 cm/s LVOT diam:     1.90 cm     LV E/e' lateral: 8.1 LVOT Area:     2.84 cm  LV Volumes (MOD) LV vol d, MOD  A2C: 61.5 ml LV vol d, MOD A4C: 82.1 ml LV vol s, MOD A2C: 27.7 ml LV vol s, MOD A4C: 34.5 ml LV SV MOD A2C:     33.8 ml LV SV MOD A4C:     82.1 ml LV SV MOD BP:      42.8 ml RIGHT VENTRICLE RV Basal diam:  2.60 cm RV S prime:     12.80 cm/s TAPSE (M-mode): 1.7 cm LEFT ATRIUM             Index        RIGHT ATRIUM           Index LA diam:        3.40 cm 1.90 cm/m   RA Area:     12.40 cm LA Vol (A2C):   34.8 ml 19.45 ml/m  RA Volume:   24.70 ml  13.81 ml/m LA Vol (A4C):   32.4 ml 18.11 ml/m LA Biplane Vol: 34.1 ml 19.06 ml/m   AORTA Ao Root diam: 3.50 cm Ao Asc diam:  2.90 cm MITRAL VALVE MV Area (PHT): 1.84 cm     SHUNTS MV Peak grad:  6.8 mmHg     Systemic Diam: 1.90 cm MV Mean grad:  2.0 mmHg MV Vmax:       1.30 m/s MV Vmean:      71.3 cm/s MV Decel Time: 412 msec MV E velocity: 60.80 cm/s MV A velocity: 109.00 cm/s MV E/A ratio:  0.56 Chilton Si MD Electronically signed by Chilton Si MD Signature Date/Time: 03/10/2023/4:00:35 PM    Final    Medications:  sodium chloride     vancomycin 750 mg (03/10/23 1615)    amLODipine  5 mg Oral q morning   atorvastatin  20 mg Oral q morning   Chlorhexidine Gluconate Cloth  6 each Topical Q0600   Chlorhexidine Gluconate Cloth  6 each Topical Q0600   darbepoetin (ARANESP) injection - DIALYSIS  60 mcg Subcutaneous  Q Wed-1800   feeding supplement (NEPRO CARB STEADY)  237 mL Oral TID BM   [START ON 03/12/2023] influenza vac split trivalent PF  0.5 mL Intramuscular Tomorrow-1000   insulin aspart  0-5 Units Subcutaneous QHS   insulin aspart  0-9 Units Subcutaneous TID WC   levothyroxine  25 mcg Oral QAC breakfast   metoprolol tartrate  50 mg Oral BID   multivitamin with minerals  1 tablet Oral Daily   mupirocin ointment   Nasal BID   pantoprazole  40 mg Oral Daily   sodium chloride flush  3 mL Intravenous Q12H

## 2023-03-12 DIAGNOSIS — U071 COVID-19: Secondary | ICD-10-CM | POA: Diagnosis not present

## 2023-03-12 DIAGNOSIS — D696 Thrombocytopenia, unspecified: Secondary | ICD-10-CM | POA: Diagnosis not present

## 2023-03-12 DIAGNOSIS — D638 Anemia in other chronic diseases classified elsewhere: Secondary | ICD-10-CM | POA: Diagnosis not present

## 2023-03-12 DIAGNOSIS — R7881 Bacteremia: Secondary | ICD-10-CM | POA: Diagnosis not present

## 2023-03-12 LAB — GLUCOSE, CAPILLARY
Glucose-Capillary: 79 mg/dL (ref 70–99)
Glucose-Capillary: 139 mg/dL — ABNORMAL HIGH (ref 70–99)
Glucose-Capillary: 129 mg/dL — ABNORMAL HIGH (ref 70–99)
Glucose-Capillary: 116 mg/dL — ABNORMAL HIGH (ref 70–99)

## 2023-03-12 MED ORDER — ORAL CARE MOUTH RINSE: 15.0000 mL | OROMUCOSAL | Status: DC | PRN

## 2023-03-12 NOTE — Progress Notes (Addendum)
  Hospitalist requested TEE. Patient is confused with cognitive impairment, unable to obtain consent from. Called patient's emergency contact/[name], she returned call, states patient has no spouse, she is the mother, and he has a younger brother as well. She reports patient has no known health care proxy and she lives with the patient and helps make medical decision when patient is not able to.   Informed Consent   Shared Decision Making/Informed Consent  The risks [esophageal damage, perforation (1:10,000 risk), bleeding, pharyngeal hematoma as well as other potential complications associated with conscious sedation including aspiration, arrhythmia, respiratory failure and death], benefits (treatment guidance and diagnostic support) and alternatives of a transesophageal echocardiogram were discussed in detail with [name] [name] (patient's mother), and she is willing to proceed. Phone consent is witnessed by [employer] PA-C.   Please maintain NPO after midnight. Please correct hypokalemia 2.9 before TEE, will repeat BMP and Mag tomorrow. INR 1.1 on 03/06/23. Orders has been placed.

## 2023-03-12 NOTE — Progress Notes (Signed)
PROGRESS NOTE    [name] [name]  ZHY:[id number] DOB: [birth year] DOA: 03/06/2023 PCP: Farris Has, MD   Brief Narrative:    [name] [name] is a 62 y.o. male with medical history significant for ESRD on HD MWF, hypertension, type 2 diabetes, pulmonary hypertension, cardiomyopathy, GERD, dyslipidemia, and anemia of CKD who presented to the ED with worsening mental status.  He was admitted with acute metabolic encephalopathy secondary to UTI/COVID-pneumonia as well as missed hemodialysis sessions.  Patient is now noted to have MRSA and enterococcal bacteremia and has been started on vancomycin.  Appreciate ID evaluation with recommendations for transfer to [facility] for fistula removal.  Vascular surgery notified and will assess upon arrival.  Plan is for hemodialysis today.  Assessment & Plan:   MRSA and Enterococcus bacteremia No significant findings on ultrasound of fistula or CT maxillofacial ID is following.  Patient is currently on vancomycin.   Concern was for infection in the AV graft for his dialysis.  Transferred to [facility] for evaluation by vascular surgery.  Seen by vascular surgery.  Plan is for excision of the graft next week.   Echocardiogram raised concern for endocarditis.  TEE is being pursued.   Cardiology was informed yesterday.  COVID infection -Remdesivir course completed -Has not required steroids -Continue isolation precautions Respiratory status is stable.  He tested positive on 9/23.  Day 10 will be 10/3.  ESRD on HD MWF/electrolyte abnormalities Management per nephrology  Acute metabolic encephalopathy Likely secondary to UTI/COVID and missed HD, and bacteremia.  Mentation has improved.  No focal neurological deficits noted. He likely has cognitive impairment at baseline.   Anemia of chronic kidney disease -Currently stable, hold ESA for now   Hypertension -Continue home blood pressure medications   Type 2 diabetes -Currently without  hyperglycemia -Maintain on SSI every 4 hours -Carb modified diet -Avoid gabapentin for neuropathy given current altered mentation   Hypothyroidism -Continue levothyroxine and TSH 0.588 with T4 1.47 -Levothyroxine dose decreased to 25 mcg daily   GERD -PPI   Thrombocytopenia Platelet count is noted to be stable.  No evidence of overt bleeding.  Heparin products on hold.  Recheck labs tomorrow.     DVT prophylaxis: SCDs Code Status: Full Family Communication: No family at bedside. Disposition Plan: PT and OT evaluation.    Consultants:  Nephrology ID Vascular-Dr. Lenell Antu  Procedures:  None  Subjective: Slow to respond.  Wants to go home.  He was explained the reason why he needed to stay in the hospital.  Objective: Vitals:   03/11/23 0500 03/11/23 1619 03/11/23 2226 03/12/23 0926  BP: (!) 178/64 (!) 168/66 (!) 157/63 (!) 148/61  Pulse: 68 71 75 73  Resp: 16 18 19 20   Temp: (!) 97.5 F (36.4 C) 98 F (36.7 C) 98 F (36.7 C) 98 F (36.7 C)  TempSrc:  Oral Oral Oral  SpO2: 99% 98% 98% 98%  Weight:      Height:        Intake/Output Summary (Last 24 hours) at 03/12/2023 1012 Last data filed at 03/12/2023 0900 Gross per 24 hour  Intake 960 ml  Output 0 ml  Net 960 ml   Filed Weights   03/07/23 1600 03/10/23 0945 03/10/23 1330  Weight: 63.1 kg 63.1 kg 62 kg    Examination:  General appearance: Awake alert.  In no distress Resp: Clear to auscultation bilaterally.  Normal effort Cardio: S1-S2 is normal regular.  No S3-S4.  No rubs murmurs or bruit GI: Abdomen  PROGRESS NOTE    [name] [name]  ZHY:[id number] DOB: [birth year] DOA: 03/06/2023 PCP: Farris Has, MD   Brief Narrative:    [name] [name] is a 62 y.o. male with medical history significant for ESRD on HD MWF, hypertension, type 2 diabetes, pulmonary hypertension, cardiomyopathy, GERD, dyslipidemia, and anemia of CKD who presented to the ED with worsening mental status.  He was admitted with acute metabolic encephalopathy secondary to UTI/COVID-pneumonia as well as missed hemodialysis sessions.  Patient is now noted to have MRSA and enterococcal bacteremia and has been started on vancomycin.  Appreciate ID evaluation with recommendations for transfer to [facility] for fistula removal.  Vascular surgery notified and will assess upon arrival.  Plan is for hemodialysis today.  Assessment & Plan:   MRSA and Enterococcus bacteremia No significant findings on ultrasound of fistula or CT maxillofacial ID is following.  Patient is currently on vancomycin.   Concern was for infection in the AV graft for his dialysis.  Transferred to [facility] for evaluation by vascular surgery.  Seen by vascular surgery.  Plan is for excision of the graft next week.   Echocardiogram raised concern for endocarditis.  TEE is being pursued.   Cardiology was informed yesterday.  COVID infection -Remdesivir course completed -Has not required steroids -Continue isolation precautions Respiratory status is stable.  He tested positive on 9/23.  Day 10 will be 10/3.  ESRD on HD MWF/electrolyte abnormalities Management per nephrology  Acute metabolic encephalopathy Likely secondary to UTI/COVID and missed HD, and bacteremia.  Mentation has improved.  No focal neurological deficits noted. He likely has cognitive impairment at baseline.   Anemia of chronic kidney disease -Currently stable, hold ESA for now   Hypertension -Continue home blood pressure medications   Type 2 diabetes -Currently without  hyperglycemia -Maintain on SSI every 4 hours -Carb modified diet -Avoid gabapentin for neuropathy given current altered mentation   Hypothyroidism -Continue levothyroxine and TSH 0.588 with T4 1.47 -Levothyroxine dose decreased to 25 mcg daily   GERD -PPI   Thrombocytopenia Platelet count is noted to be stable.  No evidence of overt bleeding.  Heparin products on hold.  Recheck labs tomorrow.     DVT prophylaxis: SCDs Code Status: Full Family Communication: No family at bedside. Disposition Plan: PT and OT evaluation.    Consultants:  Nephrology ID Vascular-Dr. Lenell Antu  Procedures:  None  Subjective: Slow to respond.  Wants to go home.  He was explained the reason why he needed to stay in the hospital.  Objective: Vitals:   03/11/23 0500 03/11/23 1619 03/11/23 2226 03/12/23 0926  BP: (!) 178/64 (!) 168/66 (!) 157/63 (!) 148/61  Pulse: 68 71 75 73  Resp: 16 18 19 20   Temp: (!) 97.5 F (36.4 C) 98 F (36.7 C) 98 F (36.7 C) 98 F (36.7 C)  TempSrc:  Oral Oral Oral  SpO2: 99% 98% 98% 98%  Weight:      Height:        Intake/Output Summary (Last 24 hours) at 03/12/2023 1012 Last data filed at 03/12/2023 0900 Gross per 24 hour  Intake 960 ml  Output 0 ml  Net 960 ml   Filed Weights   03/07/23 1600 03/10/23 0945 03/10/23 1330  Weight: 63.1 kg 63.1 kg 62 kg    Examination:  General appearance: Awake alert.  In no distress Resp: Clear to auscultation bilaterally.  Normal effort Cardio: S1-S2 is normal regular.  No S3-S4.  No rubs murmurs or bruit GI: Abdomen  PROGRESS NOTE    [name] [name]  ZHY:[id number] DOB: [birth year] DOA: 03/06/2023 PCP: Farris Has, MD   Brief Narrative:    [name] [name] is a 62 y.o. male with medical history significant for ESRD on HD MWF, hypertension, type 2 diabetes, pulmonary hypertension, cardiomyopathy, GERD, dyslipidemia, and anemia of CKD who presented to the ED with worsening mental status.  He was admitted with acute metabolic encephalopathy secondary to UTI/COVID-pneumonia as well as missed hemodialysis sessions.  Patient is now noted to have MRSA and enterococcal bacteremia and has been started on vancomycin.  Appreciate ID evaluation with recommendations for transfer to [facility] for fistula removal.  Vascular surgery notified and will assess upon arrival.  Plan is for hemodialysis today.  Assessment & Plan:   MRSA and Enterococcus bacteremia No significant findings on ultrasound of fistula or CT maxillofacial ID is following.  Patient is currently on vancomycin.   Concern was for infection in the AV graft for his dialysis.  Transferred to [facility] for evaluation by vascular surgery.  Seen by vascular surgery.  Plan is for excision of the graft next week.   Echocardiogram raised concern for endocarditis.  TEE is being pursued.   Cardiology was informed yesterday.  COVID infection -Remdesivir course completed -Has not required steroids -Continue isolation precautions Respiratory status is stable.  He tested positive on 9/23.  Day 10 will be 10/3.  ESRD on HD MWF/electrolyte abnormalities Management per nephrology  Acute metabolic encephalopathy Likely secondary to UTI/COVID and missed HD, and bacteremia.  Mentation has improved.  No focal neurological deficits noted. He likely has cognitive impairment at baseline.   Anemia of chronic kidney disease -Currently stable, hold ESA for now   Hypertension -Continue home blood pressure medications   Type 2 diabetes -Currently without  hyperglycemia -Maintain on SSI every 4 hours -Carb modified diet -Avoid gabapentin for neuropathy given current altered mentation   Hypothyroidism -Continue levothyroxine and TSH 0.588 with T4 1.47 -Levothyroxine dose decreased to 25 mcg daily   GERD -PPI   Thrombocytopenia Platelet count is noted to be stable.  No evidence of overt bleeding.  Heparin products on hold.  Recheck labs tomorrow.     DVT prophylaxis: SCDs Code Status: Full Family Communication: No family at bedside. Disposition Plan: PT and OT evaluation.    Consultants:  Nephrology ID Vascular-Dr. Lenell Antu  Procedures:  None  Subjective: Slow to respond.  Wants to go home.  He was explained the reason why he needed to stay in the hospital.  Objective: Vitals:   03/11/23 0500 03/11/23 1619 03/11/23 2226 03/12/23 0926  BP: (!) 178/64 (!) 168/66 (!) 157/63 (!) 148/61  Pulse: 68 71 75 73  Resp: 16 18 19 20   Temp: (!) 97.5 F (36.4 C) 98 F (36.7 C) 98 F (36.7 C) 98 F (36.7 C)  TempSrc:  Oral Oral Oral  SpO2: 99% 98% 98% 98%  Weight:      Height:        Intake/Output Summary (Last 24 hours) at 03/12/2023 1012 Last data filed at 03/12/2023 0900 Gross per 24 hour  Intake 960 ml  Output 0 ml  Net 960 ml   Filed Weights   03/07/23 1600 03/10/23 0945 03/10/23 1330  Weight: 63.1 kg 63.1 kg 62 kg    Examination:  General appearance: Awake alert.  In no distress Resp: Clear to auscultation bilaterally.  Normal effort Cardio: S1-S2 is normal regular.  No S3-S4.  No rubs murmurs or bruit GI: Abdomen  Weight:       139.1 lb Date of Birth:  [birth year]     BSA:          1.789 m Patient Age:    62 years      BP:           115/67 mmHg Patient Gender: M             HR:           83 bpm. Exam Location:  Inpatient Procedure: 2D Echo, Cardiac Doppler and Color Doppler Indications:    Endocarditis  History:        Patient has prior history of Echocardiogram examinations, most                 recent 12/25/2020. Cardiomyopathy, Pulmonary HTN; Risk                 Factors:Dyslipidemia, Hypertension and Diabetes.  Sonographer:    Milda Smart Referring Phys: [id number] [facility]  Sonographer Comments: Image acquisition challenging due to respiratory motion. IMPRESSIONS  1. Left ventricular ejection fraction, by estimation, is 55 to 60%. The left ventricle has normal function. The left ventricle has no regional wall motion abnormalities. Left ventricular diastolic parameters are  consistent with Grade I diastolic dysfunction (impaired relaxation).  2. Right ventricular systolic function is normal. The right ventricular size is normal.  3. Anterior and posterior mitral valve leaflets are thickened compared with echo 12/2020. Concerning for endocarditis. The mitral valve is abnormal. No evidence of mitral valve regurgitation. No evidence of mitral stenosis.  4. The aortic valve is calcified and thickened. There is a mobile density on the left coronary cusp that was not present in the echo 12/2020. Consistent with endocarditis. The aortic valve is abnormal. There is mild calcification of the aortic valve. There is moderate thickening of the aortic valve. Aortic valve regurgitation is not visualized. No aortic stenosis is present.  5. The inferior vena cava is normal in size with greater than 50% respiratory variability, suggesting right atrial pressure of 3 mmHg. Conclusion(s)/Recommendation(s): Findings concerning for endocarditis, would recommend Transesophageal Echocardiogram for clarification. FINDINGS  Left Ventricle: Left ventricular ejection fraction, by estimation, is 55 to 60%. The left ventricle has normal function. The left ventricle has no regional wall motion abnormalities. The left ventricular internal cavity size was normal in size. There is  no left ventricular hypertrophy. Left ventricular diastolic parameters are consistent with Grade I diastolic dysfunction (impaired relaxation). Normal left ventricular filling pressure. Right Ventricle: The right ventricular size is normal. No increase in right ventricular wall thickness. Right ventricular systolic function is normal. Left Atrium: Left atrial size was normal in size. Right Atrium: Right atrial size was normal in size. Pericardium: There is no evidence of pericardial effusion. Mitral Valve: Anterior and posterior mitral valve leaflets are thickened compared with echo 12/2020. Concerning for endocarditis. The mitral valve is  abnormal. No evidence of mitral valve regurgitation. No evidence of mitral valve stenosis. MV peak gradient, 6.8 mmHg. The mean mitral valve gradient is 2.0 mmHg. Tricuspid Valve: The tricuspid valve is normal in structure. Tricuspid valve regurgitation is not demonstrated. No evidence of tricuspid stenosis. Aortic Valve: The aortic valve is calcified and thickened. There is a mobile density on the left coronary cusp that was not present in the echo 12/2020. Consistent with endocarditis. The aortic valve is abnormal. There is mild calcification of the aortic valve. There is moderate thickening of the aortic valve. Aortic valve regurgitation is  PROGRESS NOTE    [name] [name]  ZHY:[id number] DOB: [birth year] DOA: 03/06/2023 PCP: Farris Has, MD   Brief Narrative:    [name] [name] is a 62 y.o. male with medical history significant for ESRD on HD MWF, hypertension, type 2 diabetes, pulmonary hypertension, cardiomyopathy, GERD, dyslipidemia, and anemia of CKD who presented to the ED with worsening mental status.  He was admitted with acute metabolic encephalopathy secondary to UTI/COVID-pneumonia as well as missed hemodialysis sessions.  Patient is now noted to have MRSA and enterococcal bacteremia and has been started on vancomycin.  Appreciate ID evaluation with recommendations for transfer to [facility] for fistula removal.  Vascular surgery notified and will assess upon arrival.  Plan is for hemodialysis today.  Assessment & Plan:   MRSA and Enterococcus bacteremia No significant findings on ultrasound of fistula or CT maxillofacial ID is following.  Patient is currently on vancomycin.   Concern was for infection in the AV graft for his dialysis.  Transferred to [facility] for evaluation by vascular surgery.  Seen by vascular surgery.  Plan is for excision of the graft next week.   Echocardiogram raised concern for endocarditis.  TEE is being pursued.   Cardiology was informed yesterday.  COVID infection -Remdesivir course completed -Has not required steroids -Continue isolation precautions Respiratory status is stable.  He tested positive on 9/23.  Day 10 will be 10/3.  ESRD on HD MWF/electrolyte abnormalities Management per nephrology  Acute metabolic encephalopathy Likely secondary to UTI/COVID and missed HD, and bacteremia.  Mentation has improved.  No focal neurological deficits noted. He likely has cognitive impairment at baseline.   Anemia of chronic kidney disease -Currently stable, hold ESA for now   Hypertension -Continue home blood pressure medications   Type 2 diabetes -Currently without  hyperglycemia -Maintain on SSI every 4 hours -Carb modified diet -Avoid gabapentin for neuropathy given current altered mentation   Hypothyroidism -Continue levothyroxine and TSH 0.588 with T4 1.47 -Levothyroxine dose decreased to 25 mcg daily   GERD -PPI   Thrombocytopenia Platelet count is noted to be stable.  No evidence of overt bleeding.  Heparin products on hold.  Recheck labs tomorrow.     DVT prophylaxis: SCDs Code Status: Full Family Communication: No family at bedside. Disposition Plan: PT and OT evaluation.    Consultants:  Nephrology ID Vascular-Dr. Lenell Antu  Procedures:  None  Subjective: Slow to respond.  Wants to go home.  He was explained the reason why he needed to stay in the hospital.  Objective: Vitals:   03/11/23 0500 03/11/23 1619 03/11/23 2226 03/12/23 0926  BP: (!) 178/64 (!) 168/66 (!) 157/63 (!) 148/61  Pulse: 68 71 75 73  Resp: 16 18 19 20   Temp: (!) 97.5 F (36.4 C) 98 F (36.7 C) 98 F (36.7 C) 98 F (36.7 C)  TempSrc:  Oral Oral Oral  SpO2: 99% 98% 98% 98%  Weight:      Height:        Intake/Output Summary (Last 24 hours) at 03/12/2023 1012 Last data filed at 03/12/2023 0900 Gross per 24 hour  Intake 960 ml  Output 0 ml  Net 960 ml   Filed Weights   03/07/23 1600 03/10/23 0945 03/10/23 1330  Weight: 63.1 kg 63.1 kg 62 kg    Examination:  General appearance: Awake alert.  In no distress Resp: Clear to auscultation bilaterally.  Normal effort Cardio: S1-S2 is normal regular.  No S3-S4.  No rubs murmurs or bruit GI: Abdomen  PROGRESS NOTE    [name] [name]  ZHY:[id number] DOB: [birth year] DOA: 03/06/2023 PCP: Farris Has, MD   Brief Narrative:    [name] [name] is a 62 y.o. male with medical history significant for ESRD on HD MWF, hypertension, type 2 diabetes, pulmonary hypertension, cardiomyopathy, GERD, dyslipidemia, and anemia of CKD who presented to the ED with worsening mental status.  He was admitted with acute metabolic encephalopathy secondary to UTI/COVID-pneumonia as well as missed hemodialysis sessions.  Patient is now noted to have MRSA and enterococcal bacteremia and has been started on vancomycin.  Appreciate ID evaluation with recommendations for transfer to [facility] for fistula removal.  Vascular surgery notified and will assess upon arrival.  Plan is for hemodialysis today.  Assessment & Plan:   MRSA and Enterococcus bacteremia No significant findings on ultrasound of fistula or CT maxillofacial ID is following.  Patient is currently on vancomycin.   Concern was for infection in the AV graft for his dialysis.  Transferred to [facility] for evaluation by vascular surgery.  Seen by vascular surgery.  Plan is for excision of the graft next week.   Echocardiogram raised concern for endocarditis.  TEE is being pursued.   Cardiology was informed yesterday.  COVID infection -Remdesivir course completed -Has not required steroids -Continue isolation precautions Respiratory status is stable.  He tested positive on 9/23.  Day 10 will be 10/3.  ESRD on HD MWF/electrolyte abnormalities Management per nephrology  Acute metabolic encephalopathy Likely secondary to UTI/COVID and missed HD, and bacteremia.  Mentation has improved.  No focal neurological deficits noted. He likely has cognitive impairment at baseline.   Anemia of chronic kidney disease -Currently stable, hold ESA for now   Hypertension -Continue home blood pressure medications   Type 2 diabetes -Currently without  hyperglycemia -Maintain on SSI every 4 hours -Carb modified diet -Avoid gabapentin for neuropathy given current altered mentation   Hypothyroidism -Continue levothyroxine and TSH 0.588 with T4 1.47 -Levothyroxine dose decreased to 25 mcg daily   GERD -PPI   Thrombocytopenia Platelet count is noted to be stable.  No evidence of overt bleeding.  Heparin products on hold.  Recheck labs tomorrow.     DVT prophylaxis: SCDs Code Status: Full Family Communication: No family at bedside. Disposition Plan: PT and OT evaluation.    Consultants:  Nephrology ID Vascular-Dr. Lenell Antu  Procedures:  None  Subjective: Slow to respond.  Wants to go home.  He was explained the reason why he needed to stay in the hospital.  Objective: Vitals:   03/11/23 0500 03/11/23 1619 03/11/23 2226 03/12/23 0926  BP: (!) 178/64 (!) 168/66 (!) 157/63 (!) 148/61  Pulse: 68 71 75 73  Resp: 16 18 19 20   Temp: (!) 97.5 F (36.4 C) 98 F (36.7 C) 98 F (36.7 C) 98 F (36.7 C)  TempSrc:  Oral Oral Oral  SpO2: 99% 98% 98% 98%  Weight:      Height:        Intake/Output Summary (Last 24 hours) at 03/12/2023 1012 Last data filed at 03/12/2023 0900 Gross per 24 hour  Intake 960 ml  Output 0 ml  Net 960 ml   Filed Weights   03/07/23 1600 03/10/23 0945 03/10/23 1330  Weight: 63.1 kg 63.1 kg 62 kg    Examination:  General appearance: Awake alert.  In no distress Resp: Clear to auscultation bilaterally.  Normal effort Cardio: S1-S2 is normal regular.  No S3-S4.  No rubs murmurs or bruit GI: Abdomen  Weight:       139.1 lb Date of Birth:  [birth year]     BSA:          1.789 m Patient Age:    62 years      BP:           115/67 mmHg Patient Gender: M             HR:           83 bpm. Exam Location:  Inpatient Procedure: 2D Echo, Cardiac Doppler and Color Doppler Indications:    Endocarditis  History:        Patient has prior history of Echocardiogram examinations, most                 recent 12/25/2020. Cardiomyopathy, Pulmonary HTN; Risk                 Factors:Dyslipidemia, Hypertension and Diabetes.  Sonographer:    Milda Smart Referring Phys: [id number] [facility]  Sonographer Comments: Image acquisition challenging due to respiratory motion. IMPRESSIONS  1. Left ventricular ejection fraction, by estimation, is 55 to 60%. The left ventricle has normal function. The left ventricle has no regional wall motion abnormalities. Left ventricular diastolic parameters are  consistent with Grade I diastolic dysfunction (impaired relaxation).  2. Right ventricular systolic function is normal. The right ventricular size is normal.  3. Anterior and posterior mitral valve leaflets are thickened compared with echo 12/2020. Concerning for endocarditis. The mitral valve is abnormal. No evidence of mitral valve regurgitation. No evidence of mitral stenosis.  4. The aortic valve is calcified and thickened. There is a mobile density on the left coronary cusp that was not present in the echo 12/2020. Consistent with endocarditis. The aortic valve is abnormal. There is mild calcification of the aortic valve. There is moderate thickening of the aortic valve. Aortic valve regurgitation is not visualized. No aortic stenosis is present.  5. The inferior vena cava is normal in size with greater than 50% respiratory variability, suggesting right atrial pressure of 3 mmHg. Conclusion(s)/Recommendation(s): Findings concerning for endocarditis, would recommend Transesophageal Echocardiogram for clarification. FINDINGS  Left Ventricle: Left ventricular ejection fraction, by estimation, is 55 to 60%. The left ventricle has normal function. The left ventricle has no regional wall motion abnormalities. The left ventricular internal cavity size was normal in size. There is  no left ventricular hypertrophy. Left ventricular diastolic parameters are consistent with Grade I diastolic dysfunction (impaired relaxation). Normal left ventricular filling pressure. Right Ventricle: The right ventricular size is normal. No increase in right ventricular wall thickness. Right ventricular systolic function is normal. Left Atrium: Left atrial size was normal in size. Right Atrium: Right atrial size was normal in size. Pericardium: There is no evidence of pericardial effusion. Mitral Valve: Anterior and posterior mitral valve leaflets are thickened compared with echo 12/2020. Concerning for endocarditis. The mitral valve is  abnormal. No evidence of mitral valve regurgitation. No evidence of mitral valve stenosis. MV peak gradient, 6.8 mmHg. The mean mitral valve gradient is 2.0 mmHg. Tricuspid Valve: The tricuspid valve is normal in structure. Tricuspid valve regurgitation is not demonstrated. No evidence of tricuspid stenosis. Aortic Valve: The aortic valve is calcified and thickened. There is a mobile density on the left coronary cusp that was not present in the echo 12/2020. Consistent with endocarditis. The aortic valve is abnormal. There is mild calcification of the aortic valve. There is moderate thickening of the aortic valve. Aortic valve regurgitation is  PROGRESS NOTE    [name] [name]  ZHY:[id number] DOB: [birth year] DOA: 03/06/2023 PCP: Farris Has, MD   Brief Narrative:    [name] [name] is a 62 y.o. male with medical history significant for ESRD on HD MWF, hypertension, type 2 diabetes, pulmonary hypertension, cardiomyopathy, GERD, dyslipidemia, and anemia of CKD who presented to the ED with worsening mental status.  He was admitted with acute metabolic encephalopathy secondary to UTI/COVID-pneumonia as well as missed hemodialysis sessions.  Patient is now noted to have MRSA and enterococcal bacteremia and has been started on vancomycin.  Appreciate ID evaluation with recommendations for transfer to [facility] [address] for fistula removal.  Vascular surgery notified and will assess upon arrival.  Plan is for hemodialysis today.  Assessment & Plan:   MRSA and Enterococcus bacteremia No significant findings on ultrasound of fistula or CT maxillofacial ID is following.  Patient is currently on vancomycin.   Concern was for infection in the AV graft for his dialysis.  Transferred to [facility] for evaluation by vascular surgery.  Seen by vascular surgery.  Plan is for excision of the graft next week.   Echocardiogram raised concern for endocarditis.  TEE is being pursued.   Cardiology was informed yesterday.  COVID infection -Remdesivir course completed -Has not required steroids -Continue isolation precautions Respiratory status is stable.  He tested positive on 9/23.  Day 10 will be 10/3.  ESRD on HD MWF/electrolyte abnormalities Management per nephrology  Acute metabolic encephalopathy Likely secondary to UTI/COVID and missed HD, and bacteremia.  Mentation has improved.  No focal neurological deficits noted. He likely has cognitive impairment at baseline.   Anemia of chronic kidney disease -Currently stable, hold ESA for now   Hypertension -Continue home blood pressure medications   Type 2 diabetes -Currently without  hyperglycemia -Maintain on SSI every 4 hours -Carb modified diet -Avoid gabapentin for neuropathy given current altered mentation   Hypothyroidism -Continue levothyroxine and TSH 0.588 with T4 1.47 -Levothyroxine dose decreased to 25 mcg daily   GERD -PPI   Thrombocytopenia Platelet count is noted to be stable.  No evidence of overt bleeding.  Heparin products on hold.  Recheck labs tomorrow.     DVT prophylaxis: SCDs Code Status: Full Family Communication: No family at bedside. Disposition Plan: PT and OT evaluation.    Consultants:  Nephrology ID Vascular-Dr. Lenell Antu  Procedures:  None  Subjective: Slow to respond.  Wants to go home.  He was explained the reason why he needed to stay in the hospital.  Objective: Vitals:   03/11/23 0500 03/11/23 1619 03/11/23 2226 03/12/23 0926  BP: (!) 178/64 (!) 168/66 (!) 157/63 (!) 148/61  Pulse: 68 71 75 73  Resp: 16 18 19 20   Temp: (!) 97.5 F (36.4 C) 98 F (36.7 C) 98 F (36.7 C) 98 F (36.7 C)  TempSrc:  Oral Oral Oral  SpO2: 99% 98% 98% 98%  Weight:      Height:        Intake/Output Summary (Last 24 hours) at 03/12/2023 1012 Last data filed at 03/12/2023 0900 Gross per 24 hour  Intake 960 ml  Output 0 ml  Net 960 ml   Filed Weights   03/07/23 1600 03/10/23 0945 03/10/23 1330  Weight: 63.1 kg 63.1 kg 62 kg    Examination:  General appearance: Awake alert.  In no distress Resp: Clear to auscultation bilaterally.  Normal effort Cardio: S1-S2 is normal regular.  No S3-S4.  No rubs murmurs or bruit GI: Abdomen

## 2023-03-12 NOTE — Plan of Care (Signed)
  Problem: Education: Goal: Knowledge of General Education information will improve Description: Including pain rating scale, medication(s)/side effects and non-pharmacologic comfort measures Outcome: Progressing   Problem: Health Behavior/Discharge Planning: Goal: Ability to manage health-related needs will improve Outcome: Progressing   Problem: Clinical Measurements: Goal: Ability to maintain clinical measurements within normal limits will improve Outcome: Progressing Goal: Will remain free from infection Outcome: Progressing Goal: Diagnostic test results will improve Outcome: Progressing Goal: Respiratory complications will improve Outcome: Progressing Goal: Cardiovascular complication will be avoided Outcome: Progressing   Problem: Activity: Goal: Risk for activity intolerance will decrease Outcome: Progressing   Problem: Nutrition: Goal: Adequate nutrition will be maintained Outcome: Progressing   Problem: Coping: Goal: Level of anxiety will decrease Outcome: Progressing   Problem: Elimination: Goal: Will not experience complications related to bowel motility Outcome: Progressing Goal: Will not experience complications related to urinary retention Outcome: Progressing   Problem: Pain Managment: Goal: General experience of comfort will improve Outcome: Progressing   Problem: Safety: Goal: Ability to remain free from injury will improve Outcome: Progressing   Problem: Skin Integrity: Goal: Risk for impaired skin integrity will decrease Outcome: Progressing   Problem: Education: Goal: Knowledge of risk factors and measures for prevention of condition will improve Outcome: Progressing   Problem: Coping: Goal: Psychosocial and spiritual needs will be supported Outcome: Progressing   Problem: Respiratory: Goal: Will maintain a patent airway Outcome: Progressing Goal: Complications related to the disease process, condition or treatment will be avoided or  minimized Outcome: Progressing   Problem: Education: Goal: Ability to describe self-care measures that may prevent or decrease complications (Diabetes Survival Skills Education) will improve Outcome: Progressing Goal: Individualized Educational Video(s) Outcome: Progressing   Problem: Coping: Goal: Ability to adjust to condition or change in health will improve Outcome: Progressing   Problem: Fluid Volume: Goal: Ability to maintain a balanced intake and output will improve Outcome: Progressing   Problem: Health Behavior/Discharge Planning: Goal: Ability to identify and utilize available resources and services will improve Outcome: Progressing Goal: Ability to manage health-related needs will improve Outcome: Progressing   Problem: Metabolic: Goal: Ability to maintain appropriate glucose levels will improve Outcome: Progressing   Problem: Nutritional: Goal: Maintenance of adequate nutrition will improve Outcome: Progressing Goal: Progress toward achieving an optimal weight will improve Outcome: Progressing   Problem: Skin Integrity: Goal: Risk for impaired skin integrity will decrease Outcome: Progressing   Problem: Tissue Perfusion: Goal: Adequacy of tissue perfusion will improve Outcome: Progressing   

## 2023-03-12 NOTE — Progress Notes (Signed)
When obtaining patient's AM vitals, it was noticed that patient was awake and alert.  He had scooted to the end of the bed.  He had a large mushy bowel movement.  He had smeared stool on the edges of the bed rails, on the telemetry box, and on the call bell.  He had smeared it over a large amount of his body.  While we were in the room, he took his stool and put it on his upper lip and nose.  He stated it was not "poop" and it smelled like perfume.  He then stated he does not know what he does in his sleep.  However, he was smearing the stool on the side rails while awake and being watched.  Patient did not want to be cleaned up.  This RN and NT proceeded to give patient a complete bath and linen change.  Will continue to monitor patient.  Bernie Covey RN

## 2023-03-12 NOTE — Progress Notes (Signed)
Subjective: Seen in room awoken from sleep no complaints , HD tomorrow  on schedule   Objective Vital signs in last 24 hours: Vitals:   03/11/23 0500 03/11/23 1619 03/11/23 2226 03/12/23 0926  BP: (!) 178/64 (!) 168/66 (!) 157/63 (!) 148/61  Pulse: 68 71 75 73  Resp: 16 18 19 20   Temp: (!) 97.5 F (36.4 C) 98 F (36.7 C) 98 F (36.7 C) 98 F (36.7 C)  TempSrc:  Oral Oral Oral  SpO2: 99% 98% 98% 98%  Weight:      Height:       Weight change:    Physical Exam: General: Chronically ill male, NAD  Heart: RRR no MRG Lungs: CTA anteriorly bilaterally unlabored breathing Abdomen: NABS, soft NTND Extremities: No pedal edema Dialysis Access: LUA AVF positive bruit, bruising erythema noted nontender to touch   OP HD : [facility],, MWF.  4 hours.  EDW 64 kg.  LUE AVG 15-gauge.  Flow rates: 400/autoflow 1.5.  3K/2.5 calcium.  No heparin.  Mircera 75 mcg every 2 weeks (last dose 9/11).  Venofer 50 mg q. weekly.    Problem/Plan: ESRD= HD MWF, was off schedule HD  back on schedule 9/27.  Next HD Monday 9/30 / 9/28= K2.9  p.o. supplement given 9/28 , follow-up labs with. Hd use added K bath   COVID-19 PNA = on remdesivir on isolation /plan per admit   Altered mental status = not quite back to normal.  Baseline dementia/; MRSA and ED faecalis bacteremia playing a role   MRSA and E faecalis bacteremia -ID following /on  vancomycin / ultrasound of AVG nonspecific  VVS  seeing  -Dr. Juanetta Gosling 9/27 noted plan to excise graft next week tentatively Wednesday 10/02  Had 2D concern for endocarditis.  Noted per admit TEE being pursued Possible AVG infection= see above , had  problems cannulating 9/27 but using 17 and successful HTN/volume -chest x-ray no excess volume (COVID-pneumonia picture) some hypertension improved with HD today, below EDW  Anemia of CKD -Hgb 10.0 continue ESA 60 q. Wednesday HD Secondary hyperparathyroidism -placed on binders, corrected calcium at goal and phosphorus   Lenny Pastel, PA-C [facility] Beeper [phone] 03/12/2023,9:44 AM  LOS: 6 days   Labs: Basic Metabolic Panel: Recent Labs  Lab 03/09/23 1554 03/10/23 1005 03/11/23 1112  NA 135 134* 134*  K 3.2* 2.8* 2.9*  CL 94* 100 96*  CO2 26 27 27   GLUCOSE 148* 113* 140*  BUN 24* 14 20  CREATININE 5.28* 3.13* 4.56*  CALCIUM 7.1* 6.8* 7.0*   Liver Function Tests: Recent Labs  Lab 03/06/23 1026  AST 21  ALT 17  ALKPHOS 154*  BILITOT 1.6*  PROT 6.5  ALBUMIN 2.6*   No results for input(s): "LIPASE", "AMYLASE" in the last 168 hours. Recent Labs  Lab 03/06/23 1026  AMMONIA 15   CBC: Recent Labs  Lab 03/06/23 1026 03/07/23 0437 03/08/23 0542 03/09/23 1554 03/10/23 1005  WBC 5.3 3.3* 6.0 6.6 6.9  NEUTROABS 4.0  --   --   --   --   HGB 13.4 10.4* 11.6* 11.8* 10.0*  HCT 41.9 34.1* 37.1* 37.0* 31.1*  MCV 89.5 90.7 91.2 87.7 86.6  PLT 99* 76* 61* 86* 78*   Cardiac Enzymes: No results for input(s): "CKTOTAL", "CKMB", "CKMBINDEX", "TROPONINI" in the last 168 hours. CBG: Recent Labs  Lab 03/10/23 2107 03/11/23 0735 03/11/23 1715 03/11/23 2228 03/12/23 0740  GLUCAP 126* 104* 129* 98 79    Studies/Results:  ECHOCARDIOGRAM COMPLETE  Result Date: 03/10/2023    ECHOCARDIOGRAM REPORT   Patient Name:   [name] [name] Date of Exam: 03/10/2023 Medical Rec #:  [id number]     Height:       70.0 in Accession #:    [id number]    Weight:       139.1 lb Date of Birth:  11-14-1960     BSA:          1.789 m Patient Age:    62 years      BP:           115/67 mmHg Patient Gender: M             HR:           83 bpm. Exam Location:  Inpatient Procedure: 2D Echo, Cardiac Doppler and Color Doppler Indications:    Endocarditis  History:        Patient has prior history of Echocardiogram examinations, most                 recent 12/25/2020. Cardiomyopathy, Pulmonary HTN; Risk                 Factors:Dyslipidemia, Hypertension and Diabetes.  Sonographer:    Milda Smart Referring Phys: [id number]  [facility]  Sonographer Comments: Image acquisition challenging due to respiratory motion. IMPRESSIONS  1. Left ventricular ejection fraction, by estimation, is 55 to 60%. The left ventricle has normal function. The left ventricle has no regional wall motion abnormalities. Left ventricular diastolic parameters are consistent with Grade I diastolic dysfunction (impaired relaxation).  2. Right ventricular systolic function is normal. The right ventricular size is normal.  3. Anterior and posterior mitral valve leaflets are thickened compared with echo 12/2020. Concerning for endocarditis. The mitral valve is abnormal. No evidence of mitral valve regurgitation. No evidence of mitral stenosis.  4. The aortic valve is calcified and thickened. There is a mobile density on the left coronary cusp that was not present in the echo 12/2020. Consistent with endocarditis. The aortic valve is abnormal. There is mild calcification of the aortic valve. There is moderate thickening of the aortic valve. Aortic valve regurgitation is not visualized. No aortic stenosis is present.  5. The inferior vena cava is normal in size with greater than 50% respiratory variability, suggesting right atrial pressure of 3 mmHg. Conclusion(s)/Recommendation(s): Findings concerning for endocarditis, would recommend Transesophageal Echocardiogram for clarification. FINDINGS  Left Ventricle: Left ventricular ejection fraction, by estimation, is 55 to 60%. The left ventricle has normal function. The left ventricle has no regional wall motion abnormalities. The left ventricular internal cavity size was normal in size. There is  no left ventricular hypertrophy. Left ventricular diastolic parameters are consistent with Grade I diastolic dysfunction (impaired relaxation). Normal left ventricular filling pressure. Right Ventricle: The right ventricular size is normal. No increase in right ventricular wall thickness. Right ventricular systolic function is  normal. Left Atrium: Left atrial size was normal in size. Right Atrium: Right atrial size was normal in size. Pericardium: There is no evidence of pericardial effusion. Mitral Valve: Anterior and posterior mitral valve leaflets are thickened compared with echo 12/2020. Concerning for endocarditis. The mitral valve is abnormal. No evidence of mitral valve regurgitation. No evidence of mitral valve stenosis. MV peak gradient, 6.8 mmHg. The mean mitral valve gradient is 2.0 mmHg. Tricuspid Valve: The tricuspid valve is normal in structure. Tricuspid valve regurgitation is not demonstrated. No evidence of tricuspid stenosis. Aortic Valve: The aortic valve  is calcified and thickened. There is a mobile density on the left coronary cusp that was not present in the echo 12/2020. Consistent with endocarditis. The aortic valve is abnormal. There is mild calcification of the aortic valve. There is moderate thickening of the aortic valve. Aortic valve regurgitation is not visualized. No aortic stenosis is present. Pulmonic Valve: The pulmonic valve was normal in structure. Pulmonic valve regurgitation is not visualized. No evidence of pulmonic stenosis. Aorta: The aortic root is normal in size and structure. Venous: The inferior vena cava is normal in size with greater than 50% respiratory variability, suggesting right atrial pressure of 3 mmHg. IAS/Shunts: No atrial level shunt detected by color flow Doppler.  LEFT VENTRICLE PLAX 2D LVIDd:         3.50 cm     Diastology LVIDs:         3.10 cm     LV e' medial:    6.31 cm/s LV PW:         1.10 cm     LV E/e' medial:  9.6 LV IVS:        1.00 cm     LV e' lateral:   7.51 cm/s LVOT diam:     1.90 cm     LV E/e' lateral: 8.1 LVOT Area:     2.84 cm  LV Volumes (MOD) LV vol d, MOD A2C: 61.5 ml LV vol d, MOD A4C: 82.1 ml LV vol s, MOD A2C: 27.7 ml LV vol s, MOD A4C: 34.5 ml LV SV MOD A2C:     33.8 ml LV SV MOD A4C:     82.1 ml LV SV MOD BP:      42.8 ml RIGHT VENTRICLE RV Basal diam:   2.60 cm RV S prime:     12.80 cm/s TAPSE (M-mode): 1.7 cm LEFT ATRIUM             Index        RIGHT ATRIUM           Index LA diam:        3.40 cm 1.90 cm/m   RA Area:     12.40 cm LA Vol (A2C):   34.8 ml 19.45 ml/m  RA Volume:   24.70 ml  13.81 ml/m LA Vol (A4C):   32.4 ml 18.11 ml/m LA Biplane Vol: 34.1 ml 19.06 ml/m   AORTA Ao Root diam: 3.50 cm Ao Asc diam:  2.90 cm MITRAL VALVE MV Area (PHT): 1.84 cm     SHUNTS MV Peak grad:  6.8 mmHg     Systemic Diam: 1.90 cm MV Mean grad:  2.0 mmHg MV Vmax:       1.30 m/s MV Vmean:      71.3 cm/s MV Decel Time: 412 msec MV E velocity: 60.80 cm/s MV A velocity: 109.00 cm/s MV E/A ratio:  0.56 Chilton Si MD Electronically signed by Chilton Si MD Signature Date/Time: 03/10/2023/4:00:35 PM    Final    Medications:  sodium chloride     vancomycin 750 mg (03/10/23 1615)    amLODipine  5 mg Oral q morning   atorvastatin  20 mg Oral q morning   Chlorhexidine Gluconate Cloth  6 each Topical Q0600   Chlorhexidine Gluconate Cloth  6 each Topical Q0600   darbepoetin (ARANESP) injection - DIALYSIS  60 mcg Subcutaneous Q Wed-1800   feeding supplement (NEPRO CARB STEADY)  237 mL Oral TID BM   influenza vac split trivalent PF  0.5  mL Intramuscular Tomorrow-1000   insulin aspart  0-5 Units Subcutaneous QHS   insulin aspart  0-9 Units Subcutaneous TID WC   levothyroxine  25 mcg Oral QAC breakfast   liver oil-zinc oxide   Topical Daily   metoprolol tartrate  50 mg Oral BID   multivitamin with minerals  1 tablet Oral Daily   mupirocin ointment   Nasal BID   pantoprazole  40 mg Oral Daily   sodium chloride flush  3 mL Intravenous Q12H

## 2023-03-13 ENCOUNTER — Encounter (HOSPITAL_COMMUNITY): Payer: Self-pay | Admitting: Anesthesiology

## 2023-03-13 ENCOUNTER — Other Ambulatory Visit (HOSPITAL_COMMUNITY): Payer: Medicare HMO

## 2023-03-13 DIAGNOSIS — G9341 Metabolic encephalopathy: Secondary | ICD-10-CM | POA: Diagnosis not present

## 2023-03-13 DIAGNOSIS — N186 End stage renal disease: Secondary | ICD-10-CM | POA: Diagnosis not present

## 2023-03-13 DIAGNOSIS — D638 Anemia in other chronic diseases classified elsewhere: Secondary | ICD-10-CM | POA: Diagnosis not present

## 2023-03-13 DIAGNOSIS — D696 Thrombocytopenia, unspecified: Secondary | ICD-10-CM | POA: Diagnosis not present

## 2023-03-13 DIAGNOSIS — Z992 Dependence on renal dialysis: Secondary | ICD-10-CM | POA: Diagnosis not present

## 2023-03-13 DIAGNOSIS — U071 COVID-19: Secondary | ICD-10-CM | POA: Diagnosis not present

## 2023-03-13 DIAGNOSIS — E1122 Type 2 diabetes mellitus with diabetic chronic kidney disease: Secondary | ICD-10-CM | POA: Diagnosis not present

## 2023-03-13 DIAGNOSIS — R7881 Bacteremia: Secondary | ICD-10-CM | POA: Diagnosis not present

## 2023-03-13 LAB — CBC
HCT: 31.2 % — ABNORMAL LOW (ref 39.0–52.0)
Hemoglobin: 9.9 g/dL — ABNORMAL LOW (ref 13.0–17.0)
MCH: 28.6 pg (ref 26.0–34.0)
MCHC: 31.7 g/dL (ref 30.0–36.0)
MCV: 90.2 fL (ref 80.0–100.0)
Platelets: 99 10*3/uL — ABNORMAL LOW (ref 150–400)
RBC: 3.46 MIL/uL — ABNORMAL LOW (ref 4.22–5.81)
RDW: 15.2 % (ref 11.5–15.5)
WBC: 5.6 10*3/uL (ref 4.0–10.5)
nRBC: 0 % (ref 0.0–0.2)

## 2023-03-13 LAB — BASIC METABOLIC PANEL
Anion gap: 10 (ref 5–15)
BUN: 29 mg/dL — ABNORMAL HIGH (ref 8–23)
CO2: 24 mmol/L (ref 22–32)
Calcium: 7 mg/dL — ABNORMAL LOW (ref 8.9–10.3)
Chloride: 101 mmol/L (ref 98–111)
Creatinine, Ser: 5.68 mg/dL — ABNORMAL HIGH (ref 0.61–1.24)
GFR, Estimated: 10 mL/min — ABNORMAL LOW (ref >60)
Glucose, Bld: 86 mg/dL (ref 70–99)
Potassium: 3 mmol/L — ABNORMAL LOW (ref 3.5–5.1)
Sodium: 135 mmol/L (ref 135–145)

## 2023-03-13 LAB — GLUCOSE, CAPILLARY
Glucose-Capillary: 83 mg/dL (ref 70–99)
Glucose-Capillary: 118 mg/dL — ABNORMAL HIGH (ref 70–99)
Glucose-Capillary: 162 mg/dL — ABNORMAL HIGH (ref 70–99)
Glucose-Capillary: 163 mg/dL — ABNORMAL HIGH (ref 70–99)

## 2023-03-13 MED ORDER — DARBEPOETIN ALFA 60 MCG/0.3ML IJ SOSY
60.0000 ug | PREFILLED_SYRINGE | INTRAMUSCULAR | Status: DC
  Filled 2023-03-13: qty 0.3

## 2023-03-13 NOTE — Progress Notes (Signed)
Physical Therapy Treatment Patient Details Name: [name] [name] MRN: [id number] DOB: [birth year] Today's Date: 03/13/2023   History of Present Illness [name] [name] is a 62 y.o. male with medical history significant for ESRD on HD MWF, hypertension, type 2 diabetes, pulmonary hypertension, cardiomyopathy, GERD, dyslipidemia, and anemia of CKD who presented to the ED with worsening mental status.  His last hemodialysis was his last hemodialysis was Wednesday of last week and he apparently had what sounds like infiltration of his fistula this past Friday and therefore was not able to receive hemodialysis.  It sounds like he went to vascular surgery access center who stated not to use his fistula and he was supposed to have a make-up session of hemodialysis on Saturday, but was too well.  He was noted to have some fever as well as some shortness of breath.  He is now noted to have worsening overall symptoms as well as confusion.    PT Comments  Continuing work on functional mobility and activity tolerance;  Session focused on functional transfers, with notable improvements in activity tolerance and level of assist since previous PT session; Needed Mod assist to stand to RW, and min assist for steadying with pivot steps bed to recliner using RW; REcommend post-acute rehab to maximize independence and safety with mobility and ADLs, decr fall risk, and decr caregiver burden; Patient will benefit from continued inpatient follow up therapy, <3 hours/day     If plan is discharge home, recommend the following: A little help with walking and/or transfers;A lot of help with bathing/dressing/bathroom;Assistance with cooking/housework;Help with stairs or ramp for entrance   Can travel by private vehicle      (perhaps soon)  Equipment Recommendations  None recommended by PT    Recommendations for Other Services       Precautions / Restrictions Precautions Precautions: Fall Precaution Comments: history of  falls Restrictions Weight Bearing Restrictions: No     Mobility  Bed Mobility Overal bed mobility: Needs Assistance Bed Mobility: Supine to Sit     Supine to sit: Mod assist     General bed mobility comments: Lots of cues to initiate; did not move his own LEs until this PT reached towards legs to help them off of teh bed    Transfers Overall transfer level: Needs assistance Equipment used: Rolling walker (2 wheels) Transfers: Sit to/from Stand, Bed to chair/wheelchair/BSC Sit to Stand: Mod assist   Step pivot transfers: Min assist       General transfer comment: Initially pt choosing not to use RW to pivot to recliner, however anxious with forward lean to start pivot transfer; better with use of RW to stand and step pivot    Ambulation/Gait                   Stairs             Wheelchair Mobility     Tilt Bed    Modified Rankin (Stroke Patients Only)       Balance     Sitting balance-Leahy Scale: Fair Sitting balance - Comments: seated at EOB     Standing balance-Leahy Scale: Poor                              Cognition Arousal: Alert Behavior During Therapy: WFL for tasks assessed/performed, Flat affect Overall Cognitive Status: Within Functional Limits for tasks assessed  Exercises      General Comments General comments (skin integrity, edema, etc.): Lillia Abed, Nephrology PA, present for beginning of session; session conducted on room air, and O2 sats remained greater tahn or equal to 96%; HR 73 post transfer to teh recliner      Pertinent Vitals/Pain Pain Assessment Pain Assessment: Faces Pain Score: 0-No pain Pain Location: Reports no pain in knees today Pain Intervention(s): Monitored during session    Home Living                          Prior Function            PT Goals (current goals can now be found in the care plan section) Acute  Rehab PT Goals Patient Stated Goal: return home with family to assist PT Goal Formulation: With patient/family Time For Goal Achievement: 03/22/23 Potential to Achieve Goals: Good Progress towards PT goals: Progressing toward goals    Frequency    Min 1X/week      PT Plan      Co-evaluation              AM-PAC PT "6 Clicks" Mobility   Outcome Measure  Help needed turning from your back to your side while in a flat bed without using bedrails?: A Little Help needed moving from lying on your back to sitting on the side of a flat bed without using bedrails?: A Lot Help needed moving to and from a bed to a chair (including a wheelchair)?: A Little Help needed standing up from a chair using your arms (e.g., wheelchair or bedside chair)?: A Lot Help needed to walk in hospital room?: Total Help needed climbing 3-5 steps with a railing? : Total 6 Click Score: 12    End of Session Equipment Utilized During Treatment: Gait belt Activity Tolerance: Patient tolerated treatment well Patient left: in chair;with call bell/phone within reach;with chair alarm set Nurse Communication: Mobility status PT Visit Diagnosis: Unsteadiness on feet (R26.81);Other abnormalities of gait and mobility (R26.89);Muscle weakness (generalized) (M62.81)     Time: 4098-1191 PT Time Calculation (min) (ACUTE ONLY): 30 min  Charges:    $Therapeutic Activity: 23-37 mins PT General Charges $$ ACUTE PT VISIT: 1 Visit                     Van Clines, PT  Acute Rehabilitation Services Office [phone] Secure Chat welcomed    Levi Aland 03/13/2023, 10:52 AM

## 2023-03-13 NOTE — Anesthesia Preprocedure Evaluation (Signed)
Anesthesia Evaluation    Reviewed: Allergy & Precautions, Patient's Chart, lab work & pertinent test results, Unable to perform ROS - Chart review only  History of Anesthesia Complications Negative for: history of anesthetic complications  Airway        Dental   Pulmonary neg pulmonary ROS          Cardiovascular hypertension, Pt. on medications and Pt. on home beta blockers pulmonary hypertension+CHF    Echo 9/20224  1. Left ventricular ejection fraction, by estimation, is 55 to 60%. The left ventricle has normal function. The left ventricle has no regional wall motion abnormalities. Left ventricular diastolic parameters are consistent with Grade I diastolic dysfunction (impaired relaxation).   2. Right ventricular systolic function is normal. The right ventricular size is normal.   3. Anterior and posterior mitral valve leaflets are thickened compared with echo 12/2020. Concerning for endocarditis. The mitral valve is abnormal. No evidence of mitral valve regurgitation. No evidence of mitral stenosis.   4. The aortic valve is calcified and thickened. There is a mobile density on the left coronary cusp that was not present in the echo 12/2020. Consistent with endocarditis. The aortic valve is abnormal. There is mild calcification of the aortic valve. There is moderate thickening of the aortic valve. Aortic valve regurgitation is not visualized. No aortic stenosis is present.   5. The inferior vena cava is normal in size with greater than 50% respiratory variability, suggesting right atrial pressure of 3 mmHg.   Conclusion(s)/Recommendation(s): Findings concerning for endocarditis,  would recommend Transesophageal Echocardiogram for clarification.     '24 TTE - EF 55 to 60%. Grade I diastolic dysfunction (impaired relaxation). Anterior and posterior mitral valve leaflets are thickened compared with echo 12/2020. There is a mobile density on  the left coronary cusp that was not present in the echo 12/2020. Consistent with endocarditis.      Neuro/Psych negative neurological ROS  negative psych ROS   GI/Hepatic Neg liver ROS,GERD  Medicated,,  Endo/Other  diabetes, Type 2Hypothyroidism   K 2.9 Ca 7.0   Renal/GU ESRF and DialysisRenal disease     Musculoskeletal  (+) Arthritis ,    Abdominal   Peds  Hematology  (+) Blood dyscrasia, anemia  Plt 99k    Anesthesia Other Findings   Reproductive/Obstetrics                             Anesthesia Physical Anesthesia Plan  ASA: 4  Anesthesia Plan: MAC   Post-op Pain Management: Minimal or no pain anticipated   Induction:   PONV Risk Score and Plan: 1 and Propofol infusion and Treatment may vary due to age or medical condition  Airway Management Planned: Nasal Cannula and Natural Airway  Additional Equipment: None  Intra-op Plan:   Post-operative Plan:   Informed Consent:   Plan Discussed with:   Anesthesia Plan Comments:        Anesthesia Quick Evaluation

## 2023-03-13 NOTE — Progress Notes (Signed)
[facility]    Date of Admission:  03/06/2023    Total days of antibiotics 8     Day 8 of vancomycin         ID: [name] [name] is a 62 y.o. 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 Denies pain, fevers, chills. Denies cough per Covid illness. Denies knowledge of issues regarding his LUE graft although had reported tenderness/erythema at time of admission.   Medications:   amLODipine  5 mg Oral q morning   atorvastatin  20 mg Oral q morning   Chlorhexidine Gluconate Cloth  6 each Topical Q0600   Chlorhexidine Gluconate Cloth  6 each Topical Q0600   darbepoetin (ARANESP) injection - DIALYSIS  60 mcg Subcutaneous Q Mon-1800   feeding supplement (NEPRO CARB STEADY)  237 mL Oral TID BM   influenza vac split trivalent PF  0.5 mL Intramuscular Tomorrow-1000   insulin aspart  0-9 Units Subcutaneous TID WC   levothyroxine  25 mcg Oral QAC breakfast   liver oil-zinc oxide   Topical Daily   metoprolol tartrate  50 mg Oral BID   multivitamin with minerals  1 tablet Oral Daily   mupirocin ointment   Nasal BID   pantoprazole  40 mg Oral Daily   sodium chloride flush  3 mL Intravenous Q12H    Objective: Vital signs in last 24 hours: Temp:  [97.6 F (36.4 C)-98.2 F (36.8 C)] 97.6 F (36.4 C) (09/30 0832) Pulse Rate:  [63-72] 63 (09/30 0832) Resp:  [18-20] 18 (09/30 0832) BP: (137-186)/(65-83) 186/71 (09/30 0832) SpO2:  [96 %-99 %] 99 % (09/30 0832)   General appearance: alert, cooperative, and no distress Head: Normocephalic, without obvious abnormality, atraumatic Resp: clear to  auscultation bilaterally Chest wall: no tenderness Cardio: regular rate and rhythm, S1, S2 normal, no murmur, click, rub or gallop and regular rate and rhythm GI: soft, non-tender; bowel sounds normal; no masses,  no organomegaly Extremities: extremities normal, atraumatic, no cyanosis or edema and LUE AV graft without erythema or tenderness Pulses: 2+ and symmetric Skin: Skin color, texture, turgor normal. No rashes or lesions  Lab Results Recent Labs    03/11/23 1112 03/13/23 0555  WBC  --  5.6  HGB  --  9.9*  HCT  --  31.2*  NA 134* 135  K 2.9* 3.0*  CL 96* 101  CO2 27 24  BUN 20 29*  CREATININE 4.56* 5.68*   Liver Panel No results for input(s): "PROT", "ALBUMIN", "AST", "ALT", "ALKPHOS", "BILITOT", "BILIDIR", "IBILI" in the last 72 hours. Sedimentation Rate No results for input(s): "ESRSEDRATE" in the last 72 hours. C-Reactive Protein No results for input(s): "CRP" in the last 72 hours.  Microbiology:  Studies/Results: No results found.   Assessment/Plan: This patient has MRSA/enterococcal bacteremia and endocarditis and currently treated with vancomycin with HD. He has a history of ESRD on MWF HD with associated anemia, HTN, T2DM, pulmonary HTN, cardiomyopathy. He was admitted with acute metabolic enceophalopathy in setting of Covid and missed hemodialysis.  MRSA/Enterococcal bacteremia and Endocarditis - continue vancomycin with HD, now day 8  - Repeat blood cultures on 9/26 are  without growth at 4 days - TEE this week, delayed per Covid infection, likely will happen later this week. Concern for endocarditis per TTE on 9/27: new aortic valve mobile density consistent with endocarditis.  - Removal of infected LUE graft via Dr. Lenell Antu is planned for Wednesday  Covid-19 infection  Symptoms continue to be mild. Received Remdesivir course. Plan to continue 10 day isolation, today is day 7.  ESRD Continue dialysis on MWF and dose vancomycin towards end of  session.  [name], [facility] Internal Medicine Resident - PGY1 Pager: [phone]  03/13/2023, 11:20 AM

## 2023-03-13 NOTE — Progress Notes (Signed)
Physical Therapy Treatment Patient Details Name: [name] [name] MRN: [id number] DOB: August 05, 1960 Today's Date: 03/13/2023   History of Present Illness [name] [name] is a 62 y.o. male with medical history significant for ESRD on HD MWF, hypertension, type 2 diabetes, pulmonary hypertension, cardiomyopathy, GERD, dyslipidemia, and anemia of CKD who presented to the ED with worsening mental status.  His last hemodialysis was his last hemodialysis was Wednesday of last week and he apparently had what sounds like infiltration of his fistula this past Friday and therefore was not able to receive hemodialysis.  It sounds like he went to vascular surgery access center who stated not to use his fistula and he was supposed to have a make-up session of hemodialysis on Saturday, but was too well.  He was noted to have some fever as well as some shortness of breath.  He is now noted to have worsening overall symptoms as well as confusion.    PT Comments  PT presents to pt room to assist RN in repositioning pt in recliner. Upon PT arrival pt had slide down to footrest of recliner, was naked, and was found to have stool covering his back. Pt dependently repositioned in recliner and assisted in hygiene tasks. Pt requesting return to bed at this time. Pt continues to require significant physical assistance to stand and maintain standing balance. Due to his history of falls and current instability PT continues to recommend inpatient PT services at the time of discharge.    If plan is discharge home, recommend the following: A lot of help with bathing/dressing/bathroom;Assistance with cooking/housework;Help with stairs or ramp for entrance;A lot of help with walking and/or transfers;Assist for transportation   Can travel by private vehicle     No  Equipment Recommendations  BSC/3in1    Recommendations for Other Services       Precautions / Restrictions Precautions Precautions: Fall Precaution Comments: history of  falls Restrictions Weight Bearing Restrictions: No     Mobility  Bed Mobility Overal bed mobility: Needs Assistance Bed Mobility: Sit to Supine       Sit to supine: Min assist        Transfers Overall transfer level: Needs assistance Equipment used: 1 person hand held assist Transfers: Sit to/from Stand, Bed to chair/wheelchair/BSC Sit to Stand: Mod assist   Step pivot transfers: Mod assist       General transfer comment: pt with posterior lean when standing and requires physical assist to power up into standing    Ambulation/Gait Ambulation/Gait assistance:  (deferred due to impaired balance)                 Stairs             Wheelchair Mobility     Tilt Bed    Modified Rankin (Stroke Patients Only)       Balance Overall balance assessment: Needs assistance Sitting-balance support: Feet supported, Single extremity supported Sitting balance-Leahy Scale: Poor Sitting balance - Comments: posterior lean, CGA Postural control: Posterior lean   Standing balance-Leahy Scale: Poor Standing balance comment: modA, posterior lean                            Cognition Arousal: Alert Behavior During Therapy: WFL for tasks assessed/performed Overall Cognitive Status: Impaired/Different from baseline Area of Impairment: Safety/judgement, Awareness                         Safety/Judgement:  Decreased awareness of safety, Decreased awareness of deficits Awareness: Intellectual            Exercises      General Comments General comments (skin integrity, edema, etc.): VSS on RA      Pertinent Vitals/Pain Pain Assessment Pain Assessment: No/denies pain    Home Living                          Prior Function            PT Goals (current goals can now be found in the care plan section) Acute Rehab PT Goals Patient Stated Goal: return home with family to assist Progress towards PT goals: Not progressing  toward goals - comment (cotinues to demonstrate poor balance and safety awareness)    Frequency    Min 1X/week      PT Plan      Co-evaluation              AM-PAC PT "6 Clicks" Mobility   Outcome Measure  Help needed turning from your back to your side while in a flat bed without using bedrails?: A Little Help needed moving from lying on your back to sitting on the side of a flat bed without using bedrails?: A Little Help needed moving to and from a bed to a chair (including a wheelchair)?: A Lot Help needed standing up from a chair using your arms (e.g., wheelchair or bedside chair)?: A Lot Help needed to walk in hospital room?: Total Help needed climbing 3-5 steps with a railing? : Total 6 Click Score: 12    End of Session   Activity Tolerance: Patient tolerated treatment well Patient left: in bed;with call bell/phone within reach;with bed alarm set Nurse Communication: Mobility status PT Visit Diagnosis: Unsteadiness on feet (R26.81);Other abnormalities of gait and mobility (R26.89);Muscle weakness (generalized) (M62.81)     Time: 8657-8469 PT Time Calculation (min) (ACUTE ONLY): 12 min  Charges:    $Therapeutic Activity: 8-22 mins PT General Charges $$ ACUTE PT VISIT: 1 Visit                     Arlyss Gandy, PT, DPT Acute Rehabilitation Office [phone]    Arlyss Gandy 03/13/2023, 3:53 PM

## 2023-03-13 NOTE — Progress Notes (Signed)
PROGRESS NOTE    [name] [name]  UKG:[id number] DOB: [birth year] DOA: 03/06/2023 PCP: Farris Has, MD   Brief Narrative:    [name] [name] is a 62 y.o. male with medical history significant for ESRD on HD MWF, hypertension, type 2 diabetes, pulmonary hypertension, cardiomyopathy, GERD, dyslipidemia, and anemia of CKD who presented to the ED with worsening mental status.  He was admitted with acute metabolic encephalopathy secondary to UTI/COVID-pneumonia as well as missed hemodialysis sessions.  Patient is now noted to have MRSA and enterococcal bacteremia and has been started on vancomycin.  Appreciate ID evaluation with recommendations for transfer to [facility] for fistula removal.  Vascular surgery notified and will assess upon arrival.  Plan is for hemodialysis today.  Assessment & Plan:   MRSA and Enterococcus bacteremia No significant findings on ultrasound of fistula or CT maxillofacial ID is following.  Patient is currently on vancomycin.   Concern was for infection in the AV graft for his dialysis.  Transferred to [facility] for evaluation by vascular surgery.  Seen by vascular surgery.  Plan is for excision of the graft next week.   Echocardiogram raised concern for endocarditis.  TEE is being pursued.   Due to his COVID-19 status cardiology cannot do this procedure until later this week.  COVID infection -Remdesivir course completed -Has not required steroids -Continue isolation precautions Respiratory status is stable.  He tested positive on 9/23.  Day 10 will be 10/3.  ESRD on HD MWF/electrolyte abnormalities Management per nephrology  Acute metabolic encephalopathy Likely secondary to UTI/COVID and missed HD, and bacteremia.  Mentation has improved.  No focal neurological deficits noted. He likely has cognitive impairment at baseline.   Anemia of chronic kidney disease -Currently stable, hold ESA for now   Hypertension Elevated blood pressure readings noted.   He is on amlodipine metoprolol.  Continue to monitor trends for now.  Supposed to be dialyzed today.   Type 2 diabetes -Currently without hyperglycemia -Maintain on SSI every 4 hours -Carb modified diet -Avoid gabapentin for neuropathy given current altered mentation   Hypothyroidism -Continue levothyroxine and TSH 0.588 with T4 1.47 -Levothyroxine dose decreased to 25 mcg daily   GERD -PPI   Thrombocytopenia Platelet counts appear to be improving now.  Heparin products on hold.  No evidence of overt bleeding.   DVT prophylaxis: SCDs Code Status: Full Family Communication: No family at bedside. Disposition Plan: SNF is recommended  Consultants:  Nephrology ID Vascular-Dr. Lenell Antu  Procedures:  None  Subjective: Slow to respond.  Denies any complaints.  Objective: Vitals:   03/12/23 1642 03/12/23 2100 03/13/23 0607 03/13/23 0832  BP: 137/65 (!) 168/70 (!) 168/83 (!) 186/71  Pulse: 72 67 67 63  Resp: 18 18 20 18   Temp: 98.2 F (36.8 C) 97.7 F (36.5 C) 97.6 F (36.4 C) 97.6 F (36.4 C)  TempSrc: Oral Oral Oral   SpO2: 99% 99% 96% 99%  Weight:      Height:        Intake/Output Summary (Last 24 hours) at 03/13/2023 0938 Last data filed at 03/13/2023 0600 Gross per 24 hour  Intake 600 ml  Output 1 ml  Net 599 ml   Filed Weights   03/07/23 1600 03/10/23 0945 03/10/23 1330  Weight: 63.1 kg 63.1 kg 62 kg    Examination:  General appearance: Awake alert.  In no distress Resp: Clear to auscultation bilaterally.  Normal effort Cardio: S1-S2 is normal regular.  No S3-S4.  No rubs murmurs or  bruit GI: Abdomen is soft.  Nontender nondistended.  Bowel sounds are present normal.  No masses organomegaly Moving his extremities   Data Reviewed: I have personally reviewed following labs and imaging studies  CBC: Recent Labs  Lab 03/06/23 1026 03/07/23 0437 03/08/23 0542 03/09/23 1554 03/10/23 1005 03/13/23 0555  WBC 5.3 3.3* 6.0 6.6 6.9 5.6  NEUTROABS 4.0   --   --   --   --   --   HGB 13.4 10.4* 11.6* 11.8* 10.0* 9.9*  HCT 41.9 34.1* 37.1* 37.0* 31.1* 31.2*  MCV 89.5 90.7 91.2 87.7 86.6 90.2  PLT 99* 76* 61* 86* 78* 99*   Basic Metabolic Panel: Recent Labs  Lab 03/07/23 0437 03/08/23 0542 03/09/23 1554 03/10/23 1005 03/11/23 1112 03/13/23 0555  NA 137 136 135 134* 134* 135  K 2.8* 3.6 3.2* 2.8* 2.9* 3.0*  CL 100 99 94* 100 96* 101  CO2 26 25 26 27 27 24   GLUCOSE 111* 87 148* 113* 140* 86  BUN 25* 15 24* 14 20 29*  CREATININE 6.81* 4.18* 5.28* 3.13* 4.56* 5.68*  CALCIUM 7.2* 7.2* 7.1* 6.8* 7.0* 7.0*  MG 1.8 1.8 1.7 1.5*  --   --    GFR: Estimated Creatinine Clearance: 11.5 mL/min (A) (by C-G formula based on SCr of 5.68 mg/dL (H)). Liver Function Tests: Recent Labs  Lab 03/06/23 1026  AST 21  ALT 17  ALKPHOS 154*  BILITOT 1.6*  PROT 6.5  ALBUMIN 2.6*    Recent Labs  Lab 03/06/23 1026  AMMONIA 15   Coagulation Profile: Recent Labs  Lab 03/06/23 1026  INR 1.1   CBG: Recent Labs  Lab 03/12/23 0740 03/12/23 1154 03/12/23 1641 03/12/23 2058 03/13/23 0826  GLUCAP 79 139* 129* 116* 83    Sepsis Labs: Recent Labs  Lab 03/06/23 1026  PROCALCITON 2.13  LATICACIDVEN 1.2    Recent Results (from the past 240 hour(s))  SARS Coronavirus 2 by RT PCR (hospital order, performed in [facility] hospital lab) *cepheid single result test* Anterior Nasal Swab     Status: Abnormal   Collection Time: 03/06/23 10:36 AM   Specimen: Anterior Nasal Swab  Result Value Ref Range Status   SARS Coronavirus 2 by RT PCR POSITIVE (A) NEGATIVE Final    Comment: (NOTE) SARS-CoV-2 target nucleic acids are DETECTED  SARS-CoV-2 RNA is generally detectable in upper respiratory specimens  during the acute phase of infection.  Positive results are indicative  of the presence of the identified virus, but do not rule out bacterial infection or co-infection with other pathogens not detected by the test.  Clinical correlation with  patient history and  other diagnostic information is necessary to determine patient infection status.  The expected result is negative.  Fact Sheet for Patients:   RoadLapTop.co.za   Fact Sheet for Healthcare Providers:   http://kim-miller.com/    This test is not yet approved or cleared by the [place] FDA and  has been authorized for detection and/or diagnosis of SARS-CoV-2 by FDA under an Emergency Use Authorization (EUA).  This EUA will remain in effect (meaning this test can be used) for the duration of  the COVID-19 declaration under Section 564(b)(1)  of the Act, 21 U.S.C. section 360-bbb-3(b)(1), unless the authorization is terminated or revoked sooner.   Performed at [facility], [address], [place], [place] [address]   Culture, blood (routine x 2)     Status: Abnormal   Collection Time: 03/06/23 11:35 AM  Specimen: BLOOD  Result Value Ref Range Status   Specimen Description   Final    BLOOD BLOOD RIGHT ARM Performed at [facility], [address], [place], [place] [address]    Special Requests   Final    BOTTLES DRAWN AEROBIC ONLY Blood Culture results may not be optimal due to an inadequate volume of blood received in culture bottles Performed at [facility], [address], [place], [place] [address]    Culture  Setup Time   Final    GRAM POSITIVE COCCI AEROBIC BOTTLE ONLY Gram Stain Report Called to,Read Back By and Verified With: DILDY,V. ON 03/07/2023 AT 1140 BY FRATTO,A. Performed at [facility], [address] [place], [place] [address]    Culture (A)  Final    STAPHYLOCOCCUS AUREUS SUSCEPTIBILITIES PERFORMED ON PREVIOUS CULTURE WITHIN THE LAST 5 DAYS. STAPHYLOCOCCUS CAPITIS THE SIGNIFICANCE OF ISOLATING THIS ORGANISM FROM A SINGLE SET OF BLOOD CULTURES WHEN MULTIPLE SETS ARE DRAWN IS UNCERTAIN. PLEASE NOTIFY THE MICROBIOLOGY DEPARTMENT WITHIN ONE WEEK IF SPECIATION AND SENSITIVITIES ARE  REQUIRED. Performed at [facility], [address], [place], [place] [address]    Report Status 03/10/2023 FINAL  Final  Culture, blood (routine x 2)     Status: Abnormal   Collection Time: 03/06/23 11:35 AM   Specimen: BLOOD  Result Value Ref Range Status   Specimen Description   Final    BLOOD BLOOD LEFT HAND Performed at [facility], [address] [place], [place] [address]    Special Requests   Final    BOTTLES DRAWN AEROBIC ONLY Blood Culture adequate volume Performed at [facility], [address], [place], [place] [address]    Culture  Setup Time   Final    GRAM POSITIVE COCCI AEROBIC BOTTLE ONLY CRITICAL RESULT CALLED TO, READ BACK BY AND VERIFIED WITH: [facility] WEAVER @ 5284 ON 03/07/23 C VARNER CRITICAL RESULT CALLED TO, READ BACK BY AND VERIFIED WITH: Dorann Lodge MENDENHALL [id number] AT 1019 BY CM Performed at [facility], [address], [place], [place] [address]    Culture (A)  Final    METHICILLIN RESISTANT STAPHYLOCOCCUS AUREUS ENTEROCOCCUS FAECALIS    Report Status 03/10/2023 FINAL  Final   Organism ID, Bacteria METHICILLIN RESISTANT STAPHYLOCOCCUS AUREUS  Final   Organism ID, Bacteria ENTEROCOCCUS FAECALIS  Final      Susceptibility   Enterococcus faecalis - MIC*    AMPICILLIN <=2 SENSITIVE Sensitive     VANCOMYCIN 1 SENSITIVE Sensitive     GENTAMICIN SYNERGY SENSITIVE Sensitive     * ENTEROCOCCUS FAECALIS   Methicillin resistant staphylococcus aureus - MIC*    CIPROFLOXACIN >=8 RESISTANT Resistant     ERYTHROMYCIN >=8 RESISTANT Resistant     GENTAMICIN <=0.5 SENSITIVE Sensitive     OXACILLIN >=4 RESISTANT Resistant     TETRACYCLINE <=1 SENSITIVE Sensitive     VANCOMYCIN 1 SENSITIVE Sensitive     TRIMETH/SULFA >=320 RESISTANT Resistant     CLINDAMYCIN <=0.25 SENSITIVE Sensitive     RIFAMPIN <=0.5 SENSITIVE Sensitive     Inducible Clindamycin NEGATIVE Sensitive     LINEZOLID 2 SENSITIVE Sensitive     * METHICILLIN RESISTANT STAPHYLOCOCCUS AUREUS   Blood Culture ID Panel (Reflexed)     Status: Abnormal   Collection Time: 03/06/23 11:35 AM  Result Value Ref Range Status   Enterococcus faecalis DETECTED (A) NOT DETECTED Final    Comment: CRITICAL RESULT CALLED TO, READ BACK BY AND VERIFIED WITH: PHARMD J MENEDENHALL  [id number] AT 1019 BY CM    Enterococcus Faecium NOT DETECTED NOT DETECTED Final   Listeria monocytogenes NOT DETECTED NOT DETECTED Final   Staphylococcus species DETECTED (A) NOT DETECTED Final    Comment: CRITICAL RESULT CALLED TO, READ BACK BY AND VERIFIED WITH: PHARMD J MENDENHALL [id number] AT 1019 BY CM    Staphylococcus aureus (BCID) DETECTED (A) NOT DETECTED Final    Comment: Methicillin (oxacillin)-resistant Staphylococcus aureus (MRSA). MRSA is predictably resistant to beta-lactam antibiotics (except ceftaroline). Preferred therapy is vancomycin unless clinically contraindicated. Patient requires contact precautions if  hospitalized. CRITICAL RESULT CALLED TO, READ BACK BY AND VERIFIED WITH: PHARMD J MENEDENHALL [id number] AT 1019 BY CM    Staphylococcus epidermidis NOT DETECTED NOT DETECTED Final   Staphylococcus lugdunensis NOT DETECTED NOT DETECTED Final   Streptococcus species NOT DETECTED NOT DETECTED Final   Streptococcus agalactiae NOT DETECTED NOT DETECTED Final   Streptococcus pneumoniae NOT DETECTED NOT DETECTED Final   Streptococcus pyogenes NOT DETECTED NOT DETECTED Final   A.calcoaceticus-baumannii NOT DETECTED NOT DETECTED Final   Bacteroides fragilis NOT DETECTED NOT DETECTED Final   Enterobacterales NOT DETECTED NOT DETECTED Final   Enterobacter cloacae complex NOT DETECTED NOT DETECTED Final   Escherichia coli NOT DETECTED NOT DETECTED Final   Klebsiella aerogenes NOT DETECTED NOT DETECTED Final   Klebsiella oxytoca NOT DETECTED NOT DETECTED Final   Klebsiella pneumoniae NOT DETECTED NOT DETECTED Final   Proteus species NOT DETECTED NOT DETECTED Final   Salmonella species NOT DETECTED NOT DETECTED  Final   Serratia marcescens NOT DETECTED NOT DETECTED Final   Haemophilus influenzae NOT DETECTED NOT DETECTED Final   Neisseria meningitidis NOT DETECTED NOT DETECTED Final   Pseudomonas aeruginosa NOT DETECTED NOT DETECTED Final   Stenotrophomonas maltophilia NOT DETECTED NOT DETECTED Final   Candida albicans NOT DETECTED NOT DETECTED Final   Candida auris NOT DETECTED NOT DETECTED Final   Candida glabrata NOT DETECTED NOT DETECTED Final   Candida krusei NOT DETECTED NOT DETECTED Final   Candida parapsilosis NOT DETECTED NOT DETECTED Final   Candida tropicalis NOT DETECTED NOT DETECTED Final   Cryptococcus neoformans/gattii NOT DETECTED NOT DETECTED Final   Meth resistant mecA/C and MREJ DETECTED (A) NOT DETECTED Final    Comment: CRITICAL RESULT CALLED TO, READ BACK BY AND VERIFIED WITH: PHARMD J MENEDENHALL [id number] AT 1019 BY CM    Vancomycin resistance NOT DETECTED NOT DETECTED Final    Comment: Performed at [facility], [address], [place], [place] [address]  MRSA Next Gen by PCR, Nasal     Status: Abnormal   Collection Time: 03/06/23  6:44 PM   Specimen: Nasal Mucosa; Nasal Swab  Result Value Ref Range Status   MRSA by PCR Next Gen DETECTED (A) NOT DETECTED Final    Comment: RESULT CALLED TO, READ BACK BY AND VERIFIED WITH: [facility] BEAVER 2104 03/06/23 BY VIRAY,J (NOTE) The GeneXpert MRSA Assay (FDA approved for NASAL specimens only), is one component of a comprehensive MRSA colonization surveillance program. It is not intended to diagnose MRSA infection nor to guide or monitor treatment for MRSA infections. Test performance is not FDA approved in patients less than 40 years old. Performed at [facility], [address] [place], [place] [address]   Culture, blood (Routine X 2) w Reflex to ID Panel     Status: Abnormal   Collection Time: 03/07/23 12:08 PM   Specimen: BLOOD  Result Value Ref Range Status   Specimen Description   Final  BLOOD ARTERIAL  DRAW Performed at [facility], [address], [place], [place] [address]    Special Requests   Final    BOTTLES DRAWN AEROBIC AND ANAEROBIC Blood Culture results may not be optimal due to an excessive volume of blood received in culture bottles Performed at [facility], [address] [place], [place] [address]    Culture  Setup Time   Final    GRAM POSITIVE COCCI IN BOTH AEROBIC AND ANAEROBIC BOTTLES CRITICAL RESULT CALLED TO, READ BACK BY AND VERIFIED WITH: K.THOMAS ON 03/08/2023 @14 :54 BY FRATTO,A  CRITICAL VALUE NOTED.  VALUE IS CONSISTENT WITH PREVIOUSLY REPORTED AND CALLED VALUE.    Culture (A)  Final    STAPHYLOCOCCUS AUREUS SUSCEPTIBILITIES PERFORMED ON PREVIOUS CULTURE WITHIN THE LAST 5 DAYS. Performed at [facility], [address] [place], [place] [address]    Report Status 03/10/2023 FINAL  Final  Culture, blood (Routine X 2) w Reflex to ID Panel     Status: Abnormal   Collection Time: 03/07/23 12:24 PM   Specimen: BLOOD  Result Value Ref Range Status   Specimen Description   Final    BLOOD ARTERIAL DRAW Performed at [facility], [address], [place], [place] [address]    Special Requests   Final    BOTTLES DRAWN AEROBIC AND ANAEROBIC Blood Culture results may not be optimal due to an excessive volume of blood received in culture bottles Performed at [facility], [address] [place], [place] [address]    Culture  Setup Time   Final    GRAM POSITIVE COCCI IN BOTH AEROBIC AND ANAEROBIC BOTTLES CRITICAL RESULT CALLED TO, READ BACK BY AND VERIFIED WITH: B.DILDI ON 03/08/2023 @12 :27 BY T.HAMER CRITICAL VALUE NOTED.  VALUE IS CONSISTENT WITH PREVIOUSLY REPORTED AND CALLED VALUE.    Culture (A)  Final    STAPHYLOCOCCUS AUREUS SUSCEPTIBILITIES PERFORMED ON PREVIOUS CULTURE WITHIN THE LAST 5 DAYS. Performed at [facility], [address] [place], [place] [address]    Report Status 03/10/2023 FINAL  Final  Culture, blood (Routine X 2) w Reflex to ID  Panel     Status: None (Preliminary result)   Collection Time: 03/09/23  3:54 PM   Specimen: BLOOD RIGHT ARM  Result Value Ref Range Status   Specimen Description BLOOD RIGHT ARM  Final   Special Requests   Final    BOTTLES DRAWN AEROBIC ONLY Blood Culture results may not be optimal due to an excessive volume of blood received in culture bottles   Culture   Final    NO GROWTH 4 DAYS Performed at [facility], [address], [place], [place] [address]    Report Status PENDING  Incomplete  Culture, blood (Routine X 2) w Reflex to ID Panel     Status: None (Preliminary result)   Collection Time: 03/09/23  3:54 PM   Specimen: BLOOD RIGHT ARM  Result Value Ref Range Status   Specimen Description BLOOD RIGHT ARM  Final   Special Requests   Final    BOTTLES DRAWN AEROBIC AND ANAEROBIC Blood Culture adequate volume   Culture   Final    NO GROWTH 4 DAYS Performed at [facility], [address], [place], [place] [address]    Report Status PENDING  Incomplete         Radiology Studies: No results found.      Scheduled Meds:  amLODipine  5 mg Oral q morning  atorvastatin  20 mg Oral q morning   Chlorhexidine Gluconate Cloth  6 each Topical Q0600   Chlorhexidine Gluconate Cloth  6 each Topical Q0600   darbepoetin (ARANESP) injection - DIALYSIS  60 mcg Subcutaneous Q Mon-1800   feeding supplement (NEPRO CARB STEADY)  237 mL Oral TID BM   influenza vac split trivalent PF  0.5 mL Intramuscular Tomorrow-1000   insulin aspart  0-9 Units Subcutaneous TID WC   levothyroxine  25 mcg Oral QAC breakfast   liver oil-zinc oxide   Topical Daily   metoprolol tartrate  50 mg Oral BID   multivitamin with minerals  1 tablet Oral Daily   mupirocin ointment   Nasal BID   pantoprazole  40 mg Oral Daily   sodium chloride flush  3 mL Intravenous Q12H   Continuous Infusions:  sodium chloride     vancomycin 750 mg (03/10/23 1615)     LOS: 7 days    Osvaldo Shipper,  [facility]  If 7PM-7AM, please contact night-coverage [email or web address] 03/13/2023, 9:38 AM

## 2023-03-13 NOTE — Progress Notes (Signed)
[facility] Progress Note   Subjective:   Patient seen and examined at bedside with PT.  Admits to weakness.  Denies CP, SOB, abdominal pain, n/v/d, and dizziness.    Objective Vitals:   03/12/23 2100 03/13/23 0607 03/13/23 0832 03/13/23 1217  BP: (!) 168/70 (!) 168/83 (!) 186/71 (!) 168/68  Pulse: 67 67 63 72  Resp: 18 20 18    Temp: 97.7 F (36.5 C) 97.6 F (36.4 C) 97.6 F (36.4 C) (!) 97.5 F (36.4 C)  TempSrc: Oral Oral    SpO2: 99% 96% 99% 100%  Weight:      Height:       Physical Exam General:chronically ill appearing male in NAD Heart:RRR, no mrg Lungs:+wheezing on R, otherwise clear, nml WOB on RA Abdomen:soft, NTND Extremities:no LE edema Dialysis Access:  LU AVG +b/t  Filed Weights   03/07/23 1600 03/10/23 0945 03/10/23 1330  Weight: 63.1 kg 63.1 kg 62 kg    Intake/Output Summary (Last 24 hours) at 03/13/2023 1519 Last data filed at 03/13/2023 0600 Gross per 24 hour  Intake 360 ml  Output 0 ml  Net 360 ml    Additional Objective Labs: Basic Metabolic Panel: Recent Labs  Lab 03/10/23 1005 03/11/23 1112 03/13/23 0555  NA 134* 134* 135  K 2.8* 2.9* 3.0*  CL 100 96* 101  CO2 27 27 24   GLUCOSE 113* 140* 86  BUN 14 20 29*  CREATININE 3.13* 4.56* 5.68*  CALCIUM 6.8* 7.0* 7.0*   Recent Labs  Lab 03/07/23 0437 03/08/23 0542 03/09/23 1554 03/10/23 1005 03/13/23 0555  WBC 3.3* 6.0 6.6 6.9 5.6  HGB 10.4* 11.6* 11.8* 10.0* 9.9*  HCT 34.1* 37.1* 37.0* 31.1* 31.2*  MCV 90.7 91.2 87.7 86.6 90.2  PLT 76* 61* 86* 78* 99*   Blood Culture    Component Value Date/Time   SDES BLOOD RIGHT ARM 03/09/2023 1554   SDES BLOOD RIGHT ARM 03/09/2023 1554   SPECREQUEST  03/09/2023 1554    BOTTLES DRAWN AEROBIC ONLY Blood Culture results may not be optimal due to an excessive volume of blood received in culture bottles   SPECREQUEST  03/09/2023 1554    BOTTLES DRAWN AEROBIC AND ANAEROBIC Blood Culture adequate volume   CULT  03/09/2023 1554     NO GROWTH 4 DAYS Performed at [facility], [address] [place], [place] [address]    CULT  03/09/2023 1554    NO GROWTH 4 DAYS Performed at [facility], [address] [place], [place] [address]    REPTSTATUS PENDING 03/09/2023 1554   REPTSTATUS PENDING 03/09/2023 1554    CBG: Recent Labs  Lab 03/12/23 1154 03/12/23 1641 03/12/23 2058 03/13/23 0826 03/13/23 1215  GLUCAP 139* 129* 116* 83 118*    Medications:  sodium chloride     vancomycin 750 mg (03/10/23 1615)    amLODipine  5 mg Oral q morning   atorvastatin  20 mg Oral q morning   Chlorhexidine Gluconate Cloth  6 each Topical Q0600   Chlorhexidine Gluconate Cloth  6 each Topical Q0600   darbepoetin (ARANESP) injection - DIALYSIS  60 mcg Subcutaneous Q Mon-1800   feeding supplement (NEPRO CARB STEADY)  237 mL Oral TID BM   influenza vac split trivalent PF  0.5 mL Intramuscular Tomorrow-1000   insulin aspart  0-9 Units Subcutaneous TID WC   levothyroxine  25 mcg Oral QAC breakfast   liver oil-zinc oxide   Topical Daily   metoprolol tartrate  50  mg Oral BID   multivitamin with minerals  1 tablet Oral Daily   mupirocin ointment   Nasal BID   pantoprazole  40 mg Oral Daily   sodium chloride flush  3 mL Intravenous Q12H    Dialysis Orders: [facility] MWF. 4 hours. EDW 64 kg. LUE AVG 15-gauge. Flow rates: 400/autoflow 1.5. 3K/2.5 calcium. No heparin. Mircera 75 mcg every 2 weeks (last dose 9/11). Venofer 50 mg q. weekly.   Assessment/Plan:  1.  MRSA and E faecalis bacteremia - ID following. On vancomycin. TEE planned for 10/4.  Concern for seeding of AVG, VVS consulted for excision.  Plan for removal on 10/2 with temporary cath  placement d/t bacteremia.   ESRD- HD MWF. HD today per regular schedule.  COVID-19 PNA - on remdesivir on isolation.  HD at bedside while on isolation.  Hypokalemia - chronic issue, liberalize diet. Order home dose - KCl daily. Use increased K bath.  Altered mental  status - close to baseline today.  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 Will need TDC placed prior to discharge once bacteremia resolved.  HTN/volume -BP elevated today. Under edw, likely weight loss.  UF as tolerated. Plan for lower dry weight on d/c.  Anemia of CKD -Hgb 9.9, continue ESA 60 q. Wednesday HD Secondary hyperparathyroidism -placed on binders, corrected calcium at goal and phosphorus Nutrition - Carb modified diet w/Fluid restrictions.  DMT2 - per PMD Debility - chronic issue.  Would benefit from SNF placement but previously has refused. Falling at home per caretaker when discussed at outpatient HD. Cared for by 29 year old mother who does her best but can not pick him up when he falls per her own account.    Virgina Norfolk, PA-C [facility] 03/13/2023,3:19 PM  LOS: 7 days

## 2023-03-13 NOTE — Progress Notes (Signed)
OT Cancellation Note  Patient Details Name: [name] [name] MRN: [id number] DOB: 12/29/1960   Cancelled Treatment:    Reason Eval/Treat Not Completed: Other (comment). Pt up in chair eating lunch and requested OT return later  Galen Manila 03/13/2023, 12:25 PM

## 2023-03-13 NOTE — Progress Notes (Signed)
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 Faecalis bacteremia. Concern for seeding of arteriovenous graft. Plan for TEE today.  ID has requested excision of the graft. This apparently appeared infected before antibiotics began. I will plan to remove the graft in its entirety on Wednesday.   Because he has active bacteremia / endocarditis, he cannot have a tunneled dialysis catheter placed. I will place a temporary catheter during the same setting to maintain HD access  SUBJECTIVE: Sleeping comfortably. No complaints when woken.   OBJECTIVE: BP (!) 168/83 (BP Location: Left Arm)   Pulse 67   Temp 97.6 F (36.4 C) (Oral)   Resp 20   Ht 5\' 10"  (1.778 m)   Wt 62 kg   SpO2 96%   BMI 19.61 kg/m   Intake/Output Summary (Last 24 hours) at 03/13/2023 0810 Last data filed at 03/13/2023 0600 Gross per 24 hour  Intake 840 ml  Output 1 ml  Net 839 ml    No distress Regular rate and rhythm Smooth thrill in left arm     Latest Ref Rng & Units 03/13/2023    5:55 AM 03/10/2023   10:05 AM 03/09/2023    3:54 PM  CBC  WBC 4.0 - 10.5 K/uL 5.6  6.9  6.6   Hemoglobin 13.0 - 17.0 g/dL 9.9  13.0  86.5   Hematocrit 39.0 - 52.0 % 31.2  31.1  37.0   Platelets 150 - 400 K/uL 99  78  86         Latest Ref Rng & Units 03/13/2023    5:55 AM 03/11/2023   11:12 AM 03/10/2023   10:05 AM  CMP  Glucose 70 - 99 mg/dL 86  784  696   BUN 8 - 23 mg/dL 29  20  14    Creatinine 0.61 - 1.24 mg/dL 2.95  2.84  1.32   Sodium 135 - 145 mmol/L 135  134  134   Potassium 3.5 - 5.1 mmol/L 3.0  2.9  2.8   Chloride 98 - 111 mmol/L 101  96  100   CO2 22 - 32 mmol/L 24  27  27    Calcium 8.9 - 10.3 mg/dL 7.0  7.0  6.8     Estimated Creatinine Clearance: 11.5 mL/min (A) (by C-G formula based on SCr of 5.68 mg/dL (H)).  [name]. Lenell Antu, MD [facility] Vascular and Vein Specialists of [facility] Phone Number: [phone] 03/13/2023 8:10 AM

## 2023-03-14 DIAGNOSIS — D696 Thrombocytopenia, unspecified: Secondary | ICD-10-CM | POA: Diagnosis not present

## 2023-03-14 DIAGNOSIS — U071 COVID-19: Secondary | ICD-10-CM | POA: Diagnosis not present

## 2023-03-14 DIAGNOSIS — D638 Anemia in other chronic diseases classified elsewhere: Secondary | ICD-10-CM | POA: Diagnosis not present

## 2023-03-14 DIAGNOSIS — R7881 Bacteremia: Secondary | ICD-10-CM | POA: Diagnosis not present

## 2023-03-14 LAB — CULTURE, BLOOD (ROUTINE X 2)
Culture: NO GROWTH
Culture: NO GROWTH
Special Requests: ADEQUATE

## 2023-03-14 LAB — HEPATIC FUNCTION PANEL
ALT: 17 U/L (ref 0–44)
AST: 33 U/L (ref 15–41)
Albumin: 2.1 g/dL — ABNORMAL LOW (ref 3.5–5.0)
Alkaline Phosphatase: 167 U/L — ABNORMAL HIGH (ref 38–126)
Bilirubin, Direct: 0.3 mg/dL — ABNORMAL HIGH (ref 0.0–0.2)
Indirect Bilirubin: 0.6 mg/dL (ref 0.3–0.9)
Total Bilirubin: 0.9 mg/dL (ref 0.3–1.2)
Total Protein: 6.5 g/dL (ref 6.5–8.1)

## 2023-03-14 LAB — GLUCOSE, CAPILLARY
Glucose-Capillary: 68 mg/dL — ABNORMAL LOW (ref 70–99)
Glucose-Capillary: 84 mg/dL (ref 70–99)
Glucose-Capillary: 131 mg/dL — ABNORMAL HIGH (ref 70–99)
Glucose-Capillary: 119 mg/dL — ABNORMAL HIGH (ref 70–99)

## 2023-03-14 MED ORDER — CHLORHEXIDINE GLUCONATE CLOTH 2 % EX PADS
6.0000 | MEDICATED_PAD | Freq: Every day | CUTANEOUS | Status: DC
  Administered 2023-03-15 – 2023-03-22 (×6): 6 via TOPICAL

## 2023-03-14 MED ORDER — DARBEPOETIN ALFA 60 MCG/0.3ML IJ SOSY
60.0000 ug | PREFILLED_SYRINGE | INTRAMUSCULAR | Status: DC
  Filled 2023-03-14: qty 0.3

## 2023-03-14 MED ORDER — VANCOMYCIN HCL 750 MG/150ML IV SOLN
750.0000 mg | Freq: Once | INTRAVENOUS | Status: AC
  Administered 2023-03-14: 750 mg via INTRAVENOUS
  Filled 2023-03-14: qty 150

## 2023-03-14 MED ORDER — GLUCOSE 40 % PO GEL
1.0000 | ORAL | Status: AC
  Administered 2023-03-14: 31 g via ORAL
  Filled 2023-03-14: qty 1.21

## 2023-03-14 NOTE — Progress Notes (Addendum)
   03/14/23 0841  Vitals  Temp 98.1 F (36.7 C)  Temp Source Oral  BP 139/76  MAP (mmHg) 96  BP Location Right Arm  BP Method Automatic  Patient Position (if appropriate) Lying  Pulse Rate 88  Pulse Rate Source Monitor  ECG Heart Rate 88  Resp 20  Oxygen Therapy  SpO2 100 %  O2 Device Room Air  During Treatment Monitoring  Blood Flow Rate (mL/min) 300 mL/min  Intra-Hemodialysis Comments Tolerated well;Tx completed  Post Treatment  Dialyzer Clearance Lightly streaked  Liters Processed 69.4  Fluid Removed (mL) 2000 mL  Tolerated HD Treatment Yes  AVG/AVF Arterial Site Held (minutes) 8 minutes  AVG/AVF Venous Site Held (minutes) 8 minutes  Fistula / Graft Left Upper arm Arteriovenous vein graft  Placement Date/Time: 12/27/22 1122   Placed prior to admission: No  Orientation: Left  Access Location: Upper arm  Access Type: Arteriovenous vein graft  Expiration Date: 06/18/27  Site Condition No complications  Fistula / Graft Assessment Thrill  Status Deaccessed  Needle Size 16  Drainage Description None   Hand off pass to M. Destiny Charity fundraiser.

## 2023-03-14 NOTE — Progress Notes (Addendum)
Vascular and Vein Specialists of   Subjective  - nods head yes when explained plans for left AV graft to be removed tomorrow.   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 Jamesina Gaugh's note.  Pateitn agrees to surgical plan. NPO past MN, consent placed on chart  [name] [name] 03/14/2023 7:34 AM --  Laboratory Lab Results: Recent Labs    03/13/23 0555  WBC 5.6  HGB 9.9*  HCT 31.2*  PLT 99*   BMET Recent Labs    03/11/23 1112 03/13/23 0555  NA 134* 135  K 2.9* 3.0*  CL 96* 101  CO2 27 24  GLUCOSE 140* 86  BUN 20 29*  CREATININE 4.56* 5.68*  CALCIUM 7.0* 7.0*    COAG Lab Results  Component Value Date   INR 1.1 03/06/2023   INR 1.1 08/30/2020   No results found for: "PTT"   VASCULAR STAFF ADDENDUM: I have independently interviewed and examined the patient. I agree with the above.   [name]. Lenell Antu, MD [facility] Vascular and Vein Specialists of [facility] Phone Number: [phone] 03/14/2023 8:46 AM

## 2023-03-14 NOTE — Progress Notes (Signed)
PROGRESS NOTE    [name] [name]  UJW:[id number] DOB: [birth year] DOA: 03/06/2023 PCP: Farris Has, MD   Brief Narrative:    [name] [name] is a 62 y.o. male with medical history significant for ESRD on HD MWF, hypertension, type 2 diabetes, pulmonary hypertension, cardiomyopathy, GERD, dyslipidemia, and anemia of CKD who presented to the ED with worsening mental status.  He was admitted with acute metabolic encephalopathy secondary to UTI/COVID-pneumonia as well as missed hemodialysis sessions.  Patient is now noted to have MRSA and enterococcal bacteremia and has been started on vancomycin.  Appreciate ID evaluation with recommendations for transfer to [facility] for fistula removal.  Vascular surgery notified and will assess upon arrival.  Plan is for hemodialysis today.  Assessment & Plan:   MRSA and Enterococcus bacteremia No significant findings on ultrasound of fistula or CT maxillofacial ID is following.  Patient is currently on vancomycin.   Concern was for infection in the AV graft for his dialysis.  Transferred to [facility] for evaluation by vascular surgery.  Seen by vascular surgery.  Plan is for excision of the graft on 10/2.  Echocardiogram raised concern for endocarditis.  TEE is being pursued.   Due to his COVID-19 status cardiology cannot do this procedure until later this week.  Tentatively scheduled for 10/4.  COVID infection -Remdesivir course completed -Has not required steroids -Continue isolation precautions Respiratory status is stable.  He tested positive on 9/23.  Day 10 will be 10/3.  ESRD on HD MWF/electrolyte abnormalities Management per nephrology  Acute metabolic encephalopathy Likely secondary to UTI/COVID and missed HD, and bacteremia.  Mentation has improved.  No focal neurological deficits noted. He likely has cognitive impairment at baseline.   Anemia of chronic kidney disease -Currently stable, hold ESA for now   Hypertension Patient  remains on amlodipine and metoprolol.  Blood pressure is noted to be better today but he is getting dialysis at this time.  May need to adjust his antihypertensive dosing depending on blood pressure trends but do not want to be too aggressive either as he is on dialysis.   Type 2 diabetes -Currently without hyperglycemia -Maintain on SSI every 4 hours -Carb modified diet -Avoid gabapentin for neuropathy given current altered mentation   Hypothyroidism -Continue levothyroxine and TSH 0.588 with T4 1.47 -Levothyroxine dose decreased to 25 mcg daily   GERD -PPI   Thrombocytopenia Platelet counts appear to be improving now.  Heparin products on hold.  No evidence for overt bleeding.   DVT prophylaxis: SCDs Code Status: Full Family Communication: No family at bedside. Disposition Plan: SNF is recommended  Consultants:  Nephrology ID Vascular-Dr. Lenell Antu  Procedures:  None  Subjective: Denies any complaints.  Getting dialyzed currently.  Objective: Vitals:   03/14/23 0731 03/14/23 0800 03/14/23 0830 03/14/23 0841  BP: (!) 145/79 126/71 133/75 139/76  Pulse: 85 85 88 88  Resp: 20 20 20 20   Temp:    98.1 F (36.7 C)  TempSrc:    Oral  SpO2: 100% 100% 100% 100%  Weight:      Height:        Intake/Output Summary (Last 24 hours) at 03/14/2023 1021 Last data filed at 03/14/2023 0841 Gross per 24 hour  Intake --  Output 2000 ml  Net -2000 ml   Filed Weights   03/07/23 1600 03/10/23 0945 03/10/23 1330  Weight: 63.1 kg 63.1 kg 62 kg    Examination:  General appearance: Awake alert.  In no distress Resp: Clear to  auscultation bilaterally.  Normal effort Cardio: S1-S2 is normal regular.  No S3-S4.  No rubs murmurs or bruit GI: Abdomen is soft.  Nontender nondistended.  Bowel sounds are present normal.  No masses organomegaly    Data Reviewed: I have personally reviewed following labs and imaging studies  CBC: Recent Labs  Lab 03/08/23 0542 03/09/23 1554  03/10/23 1005 03/13/23 0555  WBC 6.0 6.6 6.9 5.6  HGB 11.6* 11.8* 10.0* 9.9*  HCT 37.1* 37.0* 31.1* 31.2*  MCV 91.2 87.7 86.6 90.2  PLT 61* 86* 78* 99*   Basic Metabolic Panel: Recent Labs  Lab 03/08/23 0542 03/09/23 1554 03/10/23 1005 03/11/23 1112 03/13/23 0555  NA 136 135 134* 134* 135  K 3.6 3.2* 2.8* 2.9* 3.0*  CL 99 94* 100 96* 101  CO2 25 26 27 27 24   GLUCOSE 87 148* 113* 140* 86  BUN 15 24* 14 20 29*  CREATININE 4.18* 5.28* 3.13* 4.56* 5.68*  CALCIUM 7.2* 7.1* 6.8* 7.0* 7.0*  MG 1.8 1.7 1.5*  --   --    GFR: Estimated Creatinine Clearance: 11.5 mL/min (A) (by C-G formula based on SCr of 5.68 mg/dL (H)).   CBG: Recent Labs  Lab 03/13/23 0826 03/13/23 1215 03/13/23 1647 03/13/23 2035 03/14/23 1007  GLUCAP 83 118* 162* 163* 68*     Recent Results (from the past 240 hour(s))  SARS Coronavirus 2 by RT PCR (hospital order, performed in [facility] hospital lab) *cepheid single result test* Anterior Nasal Swab     Status: Abnormal   Collection Time: 03/06/23 10:36 AM   Specimen: Anterior Nasal Swab  Result Value Ref Range Status   SARS Coronavirus 2 by RT PCR POSITIVE (A) NEGATIVE Final    Comment: (NOTE) SARS-CoV-2 target nucleic acids are DETECTED  SARS-CoV-2 RNA is generally detectable in upper respiratory specimens  during the acute phase of infection.  Positive results are indicative  of the presence of the identified virus, but do not rule out bacterial infection or co-infection with other pathogens not detected by the test.  Clinical correlation with patient history and  other diagnostic information is necessary to determine patient infection status.  The expected result is negative.  Fact Sheet for Patients:   RoadLapTop.co.za   Fact Sheet for Healthcare Providers:   http://kim-miller.com/    This test is not yet approved or cleared by the [place] FDA and  has been authorized for detection  and/or diagnosis of SARS-CoV-2 by FDA under an Emergency Use Authorization (EUA).  This EUA will remain in effect (meaning this test can be used) for the duration of  the COVID-19 declaration under Section 564(b)(1)  of the Act, 21 U.S.C. section 360-bbb-3(b)(1), unless the authorization is terminated or revoked sooner.   Performed at [facility], [address], [place], [place] [address]   Culture, blood (routine x 2)     Status: Abnormal   Collection Time: 03/06/23 11:35 AM   Specimen: BLOOD  Result Value Ref Range Status   Specimen Description   Final    BLOOD BLOOD RIGHT ARM Performed at [facility], [address], [place], [place] [address]    Special Requests   Final    BOTTLES DRAWN AEROBIC ONLY Blood Culture results may not be optimal due to an inadequate volume of blood received in culture bottles Performed at [facility], [address], [place], [place] [address]    Culture  Setup Time   Final    GRAM POSITIVE COCCI AEROBIC BOTTLE  ONLY Gram Stain Report Called to,Read Back By and Verified With: DILDY,V. ON 03/07/2023 AT 1140 BY FRATTO,A. Performed at [facility], [address], [place], [place] [address]    Culture (A)  Final    STAPHYLOCOCCUS AUREUS SUSCEPTIBILITIES PERFORMED ON PREVIOUS CULTURE WITHIN THE LAST 5 DAYS. STAPHYLOCOCCUS CAPITIS THE SIGNIFICANCE OF ISOLATING THIS ORGANISM FROM A SINGLE SET OF BLOOD CULTURES WHEN MULTIPLE SETS ARE DRAWN IS UNCERTAIN. PLEASE NOTIFY THE MICROBIOLOGY DEPARTMENT WITHIN ONE WEEK IF SPECIATION AND SENSITIVITIES ARE REQUIRED. Performed at [facility], [address] [place], [place] [address]    Report Status 03/10/2023 FINAL  Final  Culture, blood (routine x 2)     Status: Abnormal   Collection Time: 03/06/23 11:35 AM   Specimen: BLOOD  Result Value Ref Range Status   Specimen Description   Final    BLOOD BLOOD LEFT HAND Performed at [facility], [address], [place], [place] [address]    Special Requests    Final    BOTTLES DRAWN AEROBIC ONLY Blood Culture adequate volume Performed at [facility], [address] [place], [place] [address]    Culture  Setup Time   Final    GRAM POSITIVE COCCI AEROBIC BOTTLE ONLY CRITICAL RESULT CALLED TO, READ BACK BY AND VERIFIED WITH: [facility] WEAVER @ 3329 ON 03/07/23 C VARNER CRITICAL RESULT CALLED TO, READ BACK BY AND VERIFIED WITH: Dorann Lodge MENDENHALL [id number] AT 1019 BY CM Performed at [facility], [address] [place], [place] [address]    Culture (A)  Final    METHICILLIN RESISTANT STAPHYLOCOCCUS AUREUS ENTEROCOCCUS FAECALIS    Report Status 03/10/2023 FINAL  Final   Organism ID, Bacteria METHICILLIN RESISTANT STAPHYLOCOCCUS AUREUS  Final   Organism ID, Bacteria ENTEROCOCCUS FAECALIS  Final      Susceptibility   Enterococcus faecalis - MIC*    AMPICILLIN <=2 SENSITIVE Sensitive     VANCOMYCIN 1 SENSITIVE Sensitive     GENTAMICIN SYNERGY SENSITIVE Sensitive     * ENTEROCOCCUS FAECALIS   Methicillin resistant staphylococcus aureus - MIC*    CIPROFLOXACIN >=8 RESISTANT Resistant     ERYTHROMYCIN >=8 RESISTANT Resistant     GENTAMICIN <=0.5 SENSITIVE Sensitive     OXACILLIN >=4 RESISTANT Resistant     TETRACYCLINE <=1 SENSITIVE Sensitive     VANCOMYCIN 1 SENSITIVE Sensitive     TRIMETH/SULFA >=320 RESISTANT Resistant     CLINDAMYCIN <=0.25 SENSITIVE Sensitive     RIFAMPIN <=0.5 SENSITIVE Sensitive     Inducible Clindamycin NEGATIVE Sensitive     LINEZOLID 2 SENSITIVE Sensitive     * METHICILLIN RESISTANT STAPHYLOCOCCUS AUREUS  Blood Culture ID Panel (Reflexed)     Status: Abnormal   Collection Time: 03/06/23 11:35 AM  Result Value Ref Range Status   Enterococcus faecalis DETECTED (A) NOT DETECTED Final    Comment: CRITICAL RESULT CALLED TO, READ BACK BY AND VERIFIED WITH: PHARMD J MENEDENHALL [id number] AT 1019 BY CM    Enterococcus Faecium NOT DETECTED NOT DETECTED Final   Listeria monocytogenes NOT DETECTED NOT DETECTED Final    Staphylococcus species DETECTED (A) NOT DETECTED Final    Comment: CRITICAL RESULT CALLED TO, READ BACK BY AND VERIFIED WITH: PHARMD J MENDENHALL [id number] AT 1019 BY CM    Staphylococcus aureus (BCID) DETECTED (A) NOT DETECTED Final    Comment: Methicillin (oxacillin)-resistant Staphylococcus aureus (MRSA). MRSA is predictably resistant to beta-lactam antibiotics (except ceftaroline). Preferred therapy is vancomycin unless clinically contraindicated. Patient requires contact precautions  if  hospitalized. CRITICAL RESULT CALLED TO, READ BACK BY AND VERIFIED WITH: PHARMD J MENEDENHALL [id number] AT 1019 BY CM    Staphylococcus epidermidis NOT DETECTED NOT DETECTED Final   Staphylococcus lugdunensis NOT DETECTED NOT DETECTED Final   Streptococcus species NOT DETECTED NOT DETECTED Final   Streptococcus agalactiae NOT DETECTED NOT DETECTED Final   Streptococcus pneumoniae NOT DETECTED NOT DETECTED Final   Streptococcus pyogenes NOT DETECTED NOT DETECTED Final   A.calcoaceticus-baumannii NOT DETECTED NOT DETECTED Final   Bacteroides fragilis NOT DETECTED NOT DETECTED Final   Enterobacterales NOT DETECTED NOT DETECTED Final   Enterobacter cloacae complex NOT DETECTED NOT DETECTED Final   Escherichia coli NOT DETECTED NOT DETECTED Final   Klebsiella aerogenes NOT DETECTED NOT DETECTED Final   Klebsiella oxytoca NOT DETECTED NOT DETECTED Final   Klebsiella pneumoniae NOT DETECTED NOT DETECTED Final   Proteus species NOT DETECTED NOT DETECTED Final   Salmonella species NOT DETECTED NOT DETECTED Final   Serratia marcescens NOT DETECTED NOT DETECTED Final   Haemophilus influenzae NOT DETECTED NOT DETECTED Final   Neisseria meningitidis NOT DETECTED NOT DETECTED Final   Pseudomonas aeruginosa NOT DETECTED NOT DETECTED Final   Stenotrophomonas maltophilia NOT DETECTED NOT DETECTED Final   Candida albicans NOT DETECTED NOT DETECTED Final   Candida auris NOT DETECTED NOT DETECTED Final   Candida glabrata  NOT DETECTED NOT DETECTED Final   Candida krusei NOT DETECTED NOT DETECTED Final   Candida parapsilosis NOT DETECTED NOT DETECTED Final   Candida tropicalis NOT DETECTED NOT DETECTED Final   Cryptococcus neoformans/gattii NOT DETECTED NOT DETECTED Final   Meth resistant mecA/C and MREJ DETECTED (A) NOT DETECTED Final    Comment: CRITICAL RESULT CALLED TO, READ BACK BY AND VERIFIED WITH: PHARMD J MENEDENHALL [id number] AT 1019 BY CM    Vancomycin resistance NOT DETECTED NOT DETECTED Final    Comment: Performed at [facility], [address], [place], [place] [address]  MRSA Next Gen by PCR, Nasal     Status: Abnormal   Collection Time: 03/06/23  6:44 PM   Specimen: Nasal Mucosa; Nasal Swab  Result Value Ref Range Status   MRSA by PCR Next Gen DETECTED (A) NOT DETECTED Final    Comment: RESULT CALLED TO, READ BACK BY AND VERIFIED WITH: [facility] BEAVER 2104 03/06/23 BY VIRAY,J (NOTE) The GeneXpert MRSA Assay (FDA approved for NASAL specimens only), is one component of a comprehensive MRSA colonization surveillance program. It is not intended to diagnose MRSA infection nor to guide or monitor treatment for MRSA infections. Test performance is not FDA approved in patients less than 16 years old. Performed at [facility], [address] [place], [place] [address]   Culture, blood (Routine X 2) w Reflex to ID Panel     Status: Abnormal   Collection Time: 03/07/23 12:08 PM   Specimen: BLOOD  Result Value Ref Range Status   Specimen Description   Final    BLOOD ARTERIAL DRAW Performed at [facility], [address], [place], [place] [address]    Special Requests   Final    BOTTLES DRAWN AEROBIC AND ANAEROBIC Blood Culture results may not be optimal due to an excessive volume of blood received in culture bottles Performed at [facility], [address] [place], [place] [address]    Culture  Setup Time   Final    GRAM POSITIVE COCCI IN BOTH AEROBIC AND ANAEROBIC BOTTLES CRITICAL  RESULT CALLED TO, READ BACK BY AND VERIFIED WITH: K.THOMAS  ON 03/08/2023 @14 :54 BY FRATTO,A  CRITICAL VALUE NOTED.  VALUE IS CONSISTENT WITH PREVIOUSLY REPORTED AND CALLED VALUE.    Culture (A)  Final    STAPHYLOCOCCUS AUREUS SUSCEPTIBILITIES PERFORMED ON PREVIOUS CULTURE WITHIN THE LAST 5 DAYS. Performed at [facility], [address], [place], [place] [address]    Report Status 03/10/2023 FINAL  Final  Culture, blood (Routine X 2) w Reflex to ID Panel     Status: Abnormal   Collection Time: 03/07/23 12:24 PM   Specimen: BLOOD  Result Value Ref Range Status   Specimen Description   Final    BLOOD ARTERIAL DRAW Performed at [facility], [address], [place], [place] [address]    Special Requests   Final    BOTTLES DRAWN AEROBIC AND ANAEROBIC Blood Culture results may not be optimal due to an excessive volume of blood received in culture bottles Performed at [facility], [address], [place], [place] [address]    Culture  Setup Time   Final    GRAM POSITIVE COCCI IN BOTH AEROBIC AND ANAEROBIC BOTTLES CRITICAL RESULT CALLED TO, READ BACK BY AND VERIFIED WITH: B.DILDI ON 03/08/2023 @12 :27 BY T.HAMER CRITICAL VALUE NOTED.  VALUE IS CONSISTENT WITH PREVIOUSLY REPORTED AND CALLED VALUE.    Culture (A)  Final    STAPHYLOCOCCUS AUREUS SUSCEPTIBILITIES PERFORMED ON PREVIOUS CULTURE WITHIN THE LAST 5 DAYS. Performed at [facility], [address] [place], [place] [address]    Report Status 03/10/2023 FINAL  Final  Culture, blood (Routine X 2) w Reflex to ID Panel     Status: None   Collection Time: 03/09/23  3:54 PM   Specimen: BLOOD RIGHT ARM  Result Value Ref Range Status   Specimen Description BLOOD RIGHT ARM  Final   Special Requests   Final    BOTTLES DRAWN AEROBIC ONLY Blood Culture results may not be optimal due to an excessive volume of blood received in culture bottles   Culture   Final    NO GROWTH 5 DAYS Performed at [facility], [address], [place], [place] [address]    Report Status 03/14/2023 FINAL  Final  Culture, blood (Routine X 2) w Reflex to ID Panel     Status: None   Collection Time: 03/09/23  3:54 PM   Specimen: BLOOD RIGHT ARM  Result Value Ref Range Status   Specimen Description BLOOD RIGHT ARM  Final   Special Requests   Final    BOTTLES DRAWN AEROBIC AND ANAEROBIC Blood Culture adequate volume   Culture   Final    NO GROWTH 5 DAYS Performed at [facility], [address] [place], [place] [address]    Report Status 03/14/2023 FINAL  Final         Radiology Studies: No results found.      Scheduled Meds:  amLODipine  5 mg Oral q morning   atorvastatin  20 mg Oral q morning   Chlorhexidine Gluconate Cloth  6 each Topical Q0600   Chlorhexidine Gluconate Cloth  6 each Topical Q0600   darbepoetin (ARANESP) injection - DIALYSIS  60 mcg Subcutaneous Q Mon-1800   dextrose  1 Tube Oral STAT   feeding supplement (NEPRO CARB STEADY)  237 mL Oral TID BM   influenza vac split trivalent PF  0.5 mL Intramuscular Tomorrow-1000   insulin aspart  0-9 Units Subcutaneous TID WC   levothyroxine  25 mcg Oral QAC breakfast   liver  oil-zinc oxide   Topical Daily   metoprolol tartrate  50 mg Oral BID   multivitamin with minerals  1 tablet Oral Daily   mupirocin ointment   Nasal BID   pantoprazole  40 mg Oral Daily   sodium chloride flush  3 mL Intravenous Q12H   Continuous Infusions:  sodium chloride     vancomycin Stopped (03/14/23 0944)   vancomycin       LOS: 8 days    Osvaldo Shipper,  [facility]  If 7PM-7AM, please contact night-coverage [email or web address] 03/14/2023, 10:21 AM

## 2023-03-14 NOTE — H&P (View-Only) (Signed)
Vascular and Vein Specialists of   Subjective  - nods head yes when explained plans for left AV graft to be removed tomorrow.   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 Jamesina [name]'s note.  Pateitn agrees to surgical plan. NPO past MN, consent placed on chart  [name] [name] 03/14/2023 7:34 AM --  Laboratory Lab Results: Recent Labs    03/13/23 0555  WBC 5.6  HGB 9.9*  HCT 31.2*  PLT 99*   BMET Recent Labs    03/11/23 1112 03/13/23 0555  NA 134* 135  K 2.9* 3.0*  CL 96* 101  CO2 27 24  GLUCOSE 140* 86  BUN 20 29*  CREATININE 4.56* 5.68*  CALCIUM 7.0* 7.0*    COAG Lab Results  Component Value Date   INR 1.1 03/06/2023   INR 1.1 08/30/2020   No results found for: "PTT"   VASCULAR STAFF ADDENDUM: I have independently interviewed and examined the patient. I agree with the above.   [name]. Lenell Antu, MD [facility] Vascular and Vein Specialists of [facility] Phone Number: [phone] 03/14/2023 8:46 AM

## 2023-03-14 NOTE — Progress Notes (Signed)
[facility]    Date of Admission:  03/06/2023    Total days of antibiotics 9 Day 9 of vancomycin          ID: [name] [name] is a 62 y.o. 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 Expresses displeasure from frequent blood draws. Has not noted fevers, pain, chills, cough, or pain at his LUE graft.  Medications:   amLODipine  5 mg Oral q morning   atorvastatin  20 mg Oral q morning   Chlorhexidine Gluconate Cloth  6 each Topical Q0600   Chlorhexidine Gluconate Cloth  6 each Topical Q0600   [START ON 03/15/2023] darbepoetin (ARANESP) injection - DIALYSIS  60 mcg Subcutaneous Q Wed-1800   feeding supplement (NEPRO CARB STEADY)  237 mL Oral TID BM   influenza vac split trivalent PF  0.5 mL Intramuscular Tomorrow-1000   insulin aspart  0-9 Units Subcutaneous TID WC   levothyroxine  25 mcg Oral QAC breakfast   liver oil-zinc oxide   Topical Daily   metoprolol tartrate  50 mg Oral BID   multivitamin with minerals  1 tablet Oral Daily   mupirocin ointment   Nasal BID   pantoprazole  40 mg Oral Daily   sodium chloride flush  3 mL Intravenous Q12H    Objective: Vital signs in last 24 hours: Temp:  [97.6 F (36.4 C)-99.8 F (37.7 C)] 98.1 F (36.7 C) (10/01 0841) Pulse Rate:  [65-98] 88 (10/01 0841) Resp:  [19-21] 20 (10/01 0841) BP: (126-184)/(71-80) 139/76 (10/01 0841) SpO2:  [99 %-100 %] 100 % (10/01 0841)   General appearance: alert, cooperative, and no distress Head: Normocephalic, without obvious abnormality, atraumatic Resp: clear to auscultation  bilaterally Cardio: regular rate and rhythm, S1, S2 normal, no murmur, click, rub or gallop GI: soft, non-tender; bowel sounds normal; no masses,  no organomegaly Extremities: extremities normal, atraumatic, no cyanosis or edema Pulses: 2+ and symmetric Skin: Skin color, texture, turgor normal. No rashes or lesions  Lab Results Recent Labs    03/13/23 0555  WBC 5.6  HGB 9.9*  HCT 31.2*  NA 135  K 3.0*  CL 101  CO2 24  BUN 29*  CREATININE 5.68*   Liver Panel Recent Labs    03/14/23 1152  PROT 6.5  ALBUMIN 2.1*  AST 33  ALT 17  ALKPHOS 167*  BILITOT 0.9  BILIDIR 0.3*  IBILI 0.6   Sedimentation Rate No results for input(s): "ESRSEDRATE" in the last 72 hours. C-Reactive Protein No results for input(s): "CRP" in the last 72 hours.  Microbiology:  Studies/Results: No results found.   Assessment/Plan: This patient has MRSA/enterococcal bacteremia and endocarditis and currently treated with vancomycin with HD. He has a history of ESRD on MWF HD with associated anemia, HTN, T2DM, pulmonary HTN, cardiomyopathy. He was admitted with acute metabolic enceophalopathy in setting of Covid and missed hemodialysis.   MRSA/Enterococcal bacteremia and Endocarditis - continue vancomycin with HD, now day 9             - Repeat blood cultures on 9/26 are without growth at 5 days - TEE this week,  delayed per Covid infection. Concern for endocarditis per TTE on 9/27: new aortic valve mobile density consistent with endocarditis.  - Removal of infected LUE graft via Dr. Lenell Antu is planned for Wednesday 10/2   Covid-19 infection  Symptoms continue to be very mild. Received Remdesivir course. Plan to continue 10 day isolation, today is day 8.   ESRD Continue dialysis on MWF and dose vancomycin towards end of session.   [name] [name], [facility] Internal Medicine Resident - PGY1 Pager: [phone]  03/14/2023, 2:34 PM

## 2023-03-14 NOTE — Progress Notes (Signed)
Received a call from pt's out-pt HD clinic social worker. Clinic staff have concerns of pt going home at d/c. Clinic staff concerns provided to [facility] staff.   Olivia Canter Renal Navigator [phone]

## 2023-03-14 NOTE — Progress Notes (Signed)
[facility] Progress Note   Subjective:   Patient seen and examined in room.  Reports dialysis went well this AM, no complications.  Denies CP, SOB, abdominal pain, fever, chills, n/v/d.  Asking for new gown/sheets.  Is sitting with his legs propped up and noticed he had a BM he appears to have spread around with his hands.  Nurse notified.   Objective Vitals:   03/14/23 0731 03/14/23 0800 03/14/23 0830 03/14/23 0841  BP: (!) 145/79 126/71 133/75 139/76  Pulse: 85 85 88 88  Resp: 20 20 20 20   Temp:    98.1 F (36.7 C)  TempSrc:    Oral  SpO2: 100% 100% 100% 100%  Weight:      Height:       Physical Exam General:chronically ill appearing male in NAD Heart:RRR, no mrg Lungs:CTA anteriorly, nml WOB on RA Abdomen:non distended Extremities:no obvious LE edema, feces on his buttocks, thighs, hands and arms Dialysis Access: L AVG   Filed Weights   03/07/23 1600 03/10/23 0945 03/10/23 1330  Weight: 63.1 kg 63.1 kg 62 kg    Intake/Output Summary (Last 24 hours) at 03/14/2023 1437 Last data filed at 03/14/2023 0841 Gross per 24 hour  Intake --  Output 2000 ml  Net -2000 ml    Additional Objective Labs: Basic Metabolic Panel: Recent Labs  Lab 03/10/23 1005 03/11/23 1112 03/13/23 0555  NA 134* 134* 135  K 2.8* 2.9* 3.0*  CL 100 96* 101  CO2 27 27 24   GLUCOSE 113* 140* 86  BUN 14 20 29*  CREATININE 3.13* 4.56* 5.68*  CALCIUM 6.8* 7.0* 7.0*   Liver Function Tests: Recent Labs  Lab 03/14/23 1152  AST 33  ALT 17  ALKPHOS 167*  BILITOT 0.9  PROT 6.5  ALBUMIN 2.1*   CBC: Recent Labs  Lab 03/08/23 0542 03/09/23 1554 03/10/23 1005 03/13/23 0555  WBC 6.0 6.6 6.9 5.6  HGB 11.6* 11.8* 10.0* 9.9*  HCT 37.1* 37.0* 31.1* 31.2*  MCV 91.2 87.7 86.6 90.2  PLT 61* 86* 78* 99*   Blood Culture    Component Value Date/Time   SDES BLOOD RIGHT ARM 03/09/2023 1554   SDES BLOOD RIGHT ARM 03/09/2023 1554   SPECREQUEST  03/09/2023 1554    BOTTLES DRAWN  AEROBIC ONLY Blood Culture results may not be optimal due to an excessive volume of blood received in culture bottles   SPECREQUEST  03/09/2023 1554    BOTTLES DRAWN AEROBIC AND ANAEROBIC Blood Culture adequate volume   CULT  03/09/2023 1554    NO GROWTH 5 DAYS Performed at [facility], [address], [place], [place] [address]    CULT  03/09/2023 1554    NO GROWTH 5 DAYS Performed at [facility], [address], [place], [place] [address]    REPTSTATUS 03/14/2023 FINAL 03/09/2023 1554   REPTSTATUS 03/14/2023 FINAL 03/09/2023 1554    Medications:  sodium chloride     vancomycin Stopped (03/14/23 0944)    amLODipine  5 mg Oral q morning   atorvastatin  20 mg Oral q morning   Chlorhexidine Gluconate Cloth  6 each Topical Q0600   Chlorhexidine Gluconate Cloth  6 each Topical Q0600   [START ON 03/15/2023] darbepoetin (ARANESP) injection - DIALYSIS  60 mcg Subcutaneous Q Wed-1800   feeding supplement (NEPRO CARB STEADY)  237 mL Oral TID BM   influenza vac split trivalent PF  0.5 mL Intramuscular Tomorrow-1000   insulin aspart  0-9 Units Subcutaneous  TID WC   levothyroxine  25 mcg Oral QAC breakfast   liver oil-zinc oxide   Topical Daily   metoprolol tartrate  50 mg Oral BID   multivitamin with minerals  1 tablet Oral Daily   mupirocin ointment   Nasal BID   pantoprazole  40 mg Oral Daily   sodium chloride flush  3 mL Intravenous Q12H    Dialysis Orders: [facility],, MWF. 4 hours. EDW 64 kg. LUE AVG 15-gauge. Flow rates: 400/autoflow 1.5. 3K/2.5 calcium. No heparin. Mircera 75 mcg every 2 weeks (last dose 9/11). Venofer 50 mg q. weekly.    Assessment/Plan:   1.  MRSA and E faecalis bacteremia - ID following. On vancomycin. TEE planned for 10/4.  Concern for seeding of AVG, VVS consulted for excision.  Plan for removal on 10/2 with temporary cath  placement d/t bacteremia.   ESRD- HD MWF. Rolled over to this AM from yesterday due to increased patient census/acuity.   Plan for HD tomorrow after Vascular surgery.  COVID-19 PNA - on remdesivir on isolation.  HD at bedside while on isolation.  Hypokalemia - chronic issue, liberalize diet. Order home dose - KCl daily. Use increased K bath.  Altered mental status - waxing and wanning.  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 Will need TDC placed prior to discharge once bacteremia resolved.  HTN/volume -BP elevated today. Under edw, likely weight loss.  UF as tolerated. Plan for lower dry weight on d/c.  Anemia of CKD -Hgb 9.9, continue ESA 60 q. Wednesday HD Secondary hyperparathyroidism -placed on binders, corrected calcium at goal and phosphorus Nutrition - Carb modified diet w/Fluid restrictions.  DMT2 - per PMD Debility - chronic issue.  Would benefit from SNF placement but previously has refused. Falling at home per caretaker when discussed at outpatient HD. Cared for by 64 year old mother who does her best but can not pick him up when he falls per her own account.   Virgina Norfolk, PA-C [facility] 03/14/2023,2:37 PM  LOS: 8 days

## 2023-03-14 NOTE — TOC Progression Note (Addendum)
Transition of Care [facility]) - Progression Note    Patient Details  Name: [name] [name] MRN: [id number] Date of Birth: 25-Sep-1960  Transition of Care [facility]) CM/SW Contact  Tom-Johnson, Hershal Coria, RN Phone Number: 03/14/2023, 3:31 PM  Clinical Narrative:     CM spoke with patient and mother, Doristine Counter about discharge disposition. Patient states he wants to go home and declines SNF. Doristine Counter states she has been taking care of patient and will continue to take care of him at home. Doristine Counter transports patient to and from Dialysis.  CM spoke with patient and bertha about home health and both has no preference. Referral called in to [facility] and [facility] voiced acceptance, info on AVS. [facility] recommended, Doristine Counter states patient has all necessary DME's at home.   CM will continue to follow as patient progresses with care towards discharge.           Expected Discharge Plan: Skilled Nursing Facility Barriers to Discharge: Continued Medical Work up  Expected Discharge Plan and Services In-house Referral: Clinical Social Work     Living arrangements for the past 2 months: Single Family Home                                       Social Determinants of Health (SDOH) Interventions SDOH Screenings   Food Insecurity: No Food Insecurity (03/06/2023)  Housing: Low Risk  (03/06/2023)  Transportation Needs: No Transportation Needs (03/06/2023)  Utilities: Not At Risk (03/06/2023)  Depression (PHQ2-9): Low Risk  (09/18/2018)  Tobacco Use: Low Risk  (03/06/2023)    Readmission Risk Interventions    03/07/2023    1:31 PM  Readmission Risk Prevention Plan  Transportation Screening Complete  HRI or Home Care Consult Complete  Palliative Care Screening Not Applicable  Medication Review (RN Care Manager) Complete

## 2023-03-14 NOTE — Evaluation (Signed)
Occupational Therapy Evaluation Patient Details Name: [name] [name] MRN: [id number] DOB: 02-14-1961 Today's Date: 03/14/2023   History of Present Illness [name] [name] is a 62 y.o. male with medical history significant for ESRD on HD MWF, hypertension, type 2 diabetes, pulmonary hypertension, cardiomyopathy, GERD, dyslipidemia, and anemia of CKD who presented to the ED with worsening mental status.  His last hemodialysis was his last hemodialysis was Wednesday of last week and he apparently had what sounds like infiltration of his fistula this past Friday and therefore was not able to receive hemodialysis.  It sounds like he went to vascular surgery access center who stated not to use his fistula and he was supposed to have a make-up session of hemodialysis on Saturday, but was too well.  He was noted to have some fever as well as some shortness of breath.  He is now noted to have worsening overall symptoms as well as confusion.   Clinical Impression   Pt s/p above diagnosis. Pt lives alone, next to mother, who at baseline assists daily with ADLs/IADLs as needed. Pt anxious today, states he wants to go home with mother repeatedly. Pt displays decreased ability to perform problem solving, decreased awareness of deficits, and likely needs significant assistance for daily routine. Pt able to perform bed mobility and sit to stand with min-mod A, poor balance. Pt repeatedly states he would rather go home with home health, does not want postacute inpatient rehab. Recommendation is postacute rehab <3hrs/day to improve safety and balance with transfers. If Pt declines, Pt states he has a manual and electric w/c at home, and states that his mother can assist as needed. If mother can assist with transfers and supervise Pt for safety, providing 24/7 support, Pt may benefit from [facility] and HHOT to maximize safety in home setting. Pt to be seen acutely to improve as able.       If plan is discharge home, recommend the  following: A little help with walking and/or transfers;A little help with bathing/dressing/bathroom;Assistance with cooking/housework;Direct supervision/assist for medications management;Direct supervision/assist for financial management;Assist for transportation;Help with stairs or ramp for entrance;Supervision due to cognitive status    Functional Status Assessment  Patient has had a recent decline in their functional status and demonstrates the ability to make significant improvements in function in a reasonable and predictable amount of time.  Equipment Recommendations  Other (comment) (defer)    Recommendations for Other Services       Precautions / Restrictions Precautions Precautions: Fall Precaution Comments: history of falls Restrictions Weight Bearing Restrictions: No      Mobility Bed Mobility Overal bed mobility: Needs Assistance Bed Mobility: Supine to Sit, Sit to Supine     Supine to sit: Min assist, HOB elevated, Used rails Sit to supine: Min assist, Used rails   General bed mobility comments: min A and verbal cueing for motivation/participation.    Transfers Overall transfer level: Needs assistance Equipment used: Rolling walker (2 wheels) Transfers: Sit to/from Stand Sit to Stand: Mod assist           General transfer comment: able to stand with mod A and verbal cueing for hang placement and safety, had LOB upon standing, declined steps or transfer.      Balance Overall balance assessment: Needs assistance Sitting-balance support: Feet supported, Single extremity supported Sitting balance-Leahy Scale: Poor Sitting balance - Comments: posterior lean, CGA Postural control: Posterior lean Standing balance support: During functional activity, Single extremity supported Standing balance-Leahy Scale: Poor Standing balance comment: modA,  posterior lean, cueing for safety                           ADL either performed or assessed with  clinical judgement   ADL Overall ADL's : Needs assistance/impaired Eating/Feeding: Set up;Supervision/ safety   Grooming: Supervision/safety;Set up   Upper Body Bathing: Contact guard assist;Sitting   Lower Body Bathing: Moderate assistance;Sitting/lateral leans;Supervison/ safety   Upper Body Dressing : Set up;Supervision/safety;Sitting   Lower Body Dressing: Moderate assistance;Sit to/from stand   Toilet Transfer: BSC/3in1;Rolling walker (2 wheels);Moderate assistance   Toileting- Clothing Manipulation and Hygiene: Maximal assistance;Cueing for safety;Cueing for sequencing;Sitting/lateral lean         General ADL Comments: Pt requires set up/supervision for most ADLs due decreased ability to problem solve, decreased hygiene, limiting participation in ADLs. Pt requires significant assistance with toileting hygiene, per nursing Pt will smear stool on face, body, bed rails, etc.     Vision Baseline Vision/History: 1 Wears glasses Ability to See in Adequate Light: 0 Adequate Patient Visual Report: No change from baseline       Perception         Praxis         Pertinent Vitals/Pain Pain Assessment Pain Assessment: No/denies pain Pain Score: 0-No pain     Extremity/Trunk Assessment Upper Extremity Assessment Upper Extremity Assessment: RUE deficits/detail RUE Deficits / Details: stiffness in R shoulder, limited overhead reach. RUE Sensation: WNL RUE Coordination: decreased gross motor   Lower Extremity Assessment Lower Extremity Assessment: Defer to PT evaluation       Communication Communication Communication: No apparent difficulties   Cognition Arousal: Alert Behavior During Therapy: Anxious Overall Cognitive Status: Impaired/Different from baseline Area of Impairment: Safety/judgement, Awareness                           Awareness: Intellectual   General Comments: anxious about going home to mother. Pt able to answer orientation  questions. Follows commands consistently but needs verbal cueing for motivation/participation. Pt repeatedly stated he wanted his mother and to go home.     General Comments       Exercises     Shoulder Instructions      Home Living Family/patient expects to be discharged to:: Private residence Living Arrangements: Parent Available Help at Discharge: Family;Available 24 hours/day Type of Home: House Home Access: Stairs to enter [employer] of Steps: 1 step in back   Home Layout: One level     Bathroom Shower/Tub: Walk-in shower         Home Equipment: Shower seat;Cane - single Librarian, academic (2 wheels);Wheelchair - manual;Wheelchair - power   Additional Comments: Above info is for mothers house per Pt. Pt lives behind mothers house in mobile home, but plants to stay with mother.      Prior Functioning/Environment Prior Level of Function : Needs assist             Mobility Comments: uses walker and w/c as needed around mothers home and out in community ADLs Comments: Pt states he is independent with ADLs, per documentation Pt's mother helps as needed.        OT Problem List: Decreased range of motion;Decreased strength;Decreased activity tolerance;Impaired balance (sitting and/or standing);Decreased safety awareness;Decreased knowledge of use of DME or AE;Impaired UE functional use      OT Treatment/Interventions: Self-care/ADL training;Therapeutic exercise;DME and/or AE instruction;Therapeutic activities;Balance training;Patient/family education    OT Goals(Current  goals can be found in the care plan section) Acute Rehab OT Goals Patient Stated Goal: to return home OT Goal Formulation: With patient Time For Goal Achievement: 03/28/23 Potential to Achieve Goals: Good  OT Frequency: Min 1X/week    Co-evaluation              AM-PAC OT "6 Clicks" Daily Activity     Outcome Measure Help from another person eating meals?: A Little Help  from another person taking care of personal grooming?: A Little Help from another person toileting, which includes using toliet, bedpan, or urinal?: Total Help from another person bathing (including washing, rinsing, drying)?: A Lot Help from another person to put on and taking off regular upper body clothing?: A Little Help from another person to put on and taking off regular lower body clothing?: A Lot 6 Click Score: 14   End of Session Equipment Utilized During Treatment: Gait belt;Rolling walker (2 wheels) Nurse Communication: Mobility status  Activity Tolerance: Other (comment) (limited due to cognition and Pt participation) Patient left: in bed;with call bell/phone within reach;with bed alarm set  OT Visit Diagnosis: Unsteadiness on feet (R26.81);Other abnormalities of gait and mobility (R26.89);Muscle weakness (generalized) (M62.81);Other symptoms and signs involving cognitive function                Time: 2725-3664 OT Time Calculation (min): 28 min Charges:  OT General Charges $OT Visit: 1 Visit OT Evaluation $OT Eval Low Complexity: 1 Low OT Treatments $Self Care/Home Management : 8-22 mins  [place], OTR/L   Alexis Goodell 03/14/2023, 5:36 PM

## 2023-03-15 ENCOUNTER — Inpatient Hospital Stay (HOSPITAL_COMMUNITY): Payer: Medicare HMO | Admitting: Anesthesiology

## 2023-03-15 ENCOUNTER — Inpatient Hospital Stay (HOSPITAL_COMMUNITY): Payer: Medicare HMO

## 2023-03-15 ENCOUNTER — Encounter (HOSPITAL_COMMUNITY): Payer: Self-pay | Admitting: Internal Medicine

## 2023-03-15 ENCOUNTER — Other Ambulatory Visit: Payer: Self-pay

## 2023-03-15 ENCOUNTER — Encounter (HOSPITAL_COMMUNITY)
Admission: EM | Disposition: A | Discharge status: Skilled Nursing Facility | Payer: Self-pay | Source: Home / Self Care | Attending: Internal Medicine

## 2023-03-15 DIAGNOSIS — N186 End stage renal disease: Secondary | ICD-10-CM | POA: Diagnosis not present

## 2023-03-15 DIAGNOSIS — I132 Hypertensive heart and chronic kidney disease with heart failure and with stage 5 chronic kidney disease, or end stage renal disease: Secondary | ICD-10-CM | POA: Diagnosis not present

## 2023-03-15 DIAGNOSIS — Z992 Dependence on renal dialysis: Secondary | ICD-10-CM

## 2023-03-15 DIAGNOSIS — E1122 Type 2 diabetes mellitus with diabetic chronic kidney disease: Secondary | ICD-10-CM | POA: Diagnosis not present

## 2023-03-15 DIAGNOSIS — T827XXA Infection and inflammatory reaction due to other cardiac and vascular devices, implants and grafts, initial encounter: Secondary | ICD-10-CM | POA: Diagnosis not present

## 2023-03-15 DIAGNOSIS — G9341 Metabolic encephalopathy: Secondary | ICD-10-CM | POA: Diagnosis not present

## 2023-03-15 DIAGNOSIS — Z452 Encounter for adjustment and management of vascular access device: Secondary | ICD-10-CM

## 2023-03-15 HISTORY — PX: AVGG REMOVAL: SHX5153

## 2023-03-15 HISTORY — PX: INSERTION OF DIALYSIS CATHETER: SHX1324

## 2023-03-15 LAB — VANCOMYCIN, RANDOM: Vancomycin Rm: 24 ug/mL

## 2023-03-15 LAB — RENAL FUNCTION PANEL
Albumin: 1.7 g/dL — ABNORMAL LOW (ref 3.5–5.0)
Anion gap: 11 (ref 5–15)
BUN: 17 mg/dL (ref 8–23)
CO2: 26 mmol/L (ref 22–32)
Calcium: 7.1 mg/dL — ABNORMAL LOW (ref 8.9–10.3)
Chloride: 100 mmol/L (ref 98–111)
Creatinine, Ser: 4.06 mg/dL — ABNORMAL HIGH (ref 0.61–1.24)
GFR, Estimated: 16 mL/min — ABNORMAL LOW (ref >60)
Glucose, Bld: 99 mg/dL (ref 70–99)
Phosphorus: 2.1 mg/dL — ABNORMAL LOW (ref 2.5–4.6)
Potassium: 3.4 mmol/L — ABNORMAL LOW (ref 3.5–5.1)
Sodium: 137 mmol/L (ref 135–145)

## 2023-03-15 LAB — GLUCOSE, CAPILLARY
Glucose-Capillary: 80 mg/dL (ref 70–99)
Glucose-Capillary: 91 mg/dL (ref 70–99)
Glucose-Capillary: 89 mg/dL (ref 70–99)
Glucose-Capillary: 99 mg/dL (ref 70–99)
Glucose-Capillary: 137 mg/dL — ABNORMAL HIGH (ref 70–99)

## 2023-03-15 LAB — CBC
HCT: 28.1 % — ABNORMAL LOW (ref 39.0–52.0)
Hemoglobin: 9 g/dL — ABNORMAL LOW (ref 13.0–17.0)
MCH: 28.2 pg (ref 26.0–34.0)
MCHC: 32 g/dL (ref 30.0–36.0)
MCV: 88.1 fL (ref 80.0–100.0)
Platelets: 94 10*3/uL — ABNORMAL LOW (ref 150–400)
RBC: 3.19 MIL/uL — ABNORMAL LOW (ref 4.22–5.81)
RDW: 14.9 % (ref 11.5–15.5)
WBC: 4.9 10*3/uL (ref 4.0–10.5)
nRBC: 0 % (ref 0.0–0.2)

## 2023-03-15 SURGERY — INSERTION OF DIALYSIS CATHETER
Anesthesia: General

## 2023-03-15 MED ORDER — PHENYLEPHRINE 80 MCG/ML (10ML) SYRINGE FOR IV PUSH (FOR BLOOD PRESSURE SUPPORT)
PREFILLED_SYRINGE | INTRAVENOUS | Status: AC
  Filled 2023-03-15: qty 10

## 2023-03-15 MED ORDER — CEFAZOLIN SODIUM-DEXTROSE 2-3 GM-%(50ML) IV SOLR
INTRAVENOUS | Status: DC | PRN
  Administered 2023-03-15: 2 g via INTRAVENOUS

## 2023-03-15 MED ORDER — GLYCOPYRROLATE PF 0.2 MG/ML IJ SOSY
PREFILLED_SYRINGE | INTRAMUSCULAR | Status: AC
  Filled 2023-03-15: qty 1

## 2023-03-15 MED ORDER — PHENYLEPHRINE HCL-NACL 20-0.9 MG/250ML-% IV SOLN
INTRAVENOUS | Status: DC | PRN
  Administered 2023-03-15 (×8): 80 ug via INTRAVENOUS

## 2023-03-15 MED ORDER — ONDANSETRON HCL 4 MG/2ML IJ SOLN: 4.0000 mg | Freq: Once | INTRAMUSCULAR | Status: DC | PRN

## 2023-03-15 MED ORDER — SUCCINYLCHOLINE CHLORIDE 200 MG/10ML IV SOSY
PREFILLED_SYRINGE | INTRAVENOUS | Status: AC
  Filled 2023-03-15: qty 10

## 2023-03-15 MED ORDER — GLYCOPYRROLATE 0.2 MG/ML IJ SOLN
INTRAMUSCULAR | Status: DC | PRN
Start: 2023-03-15 — End: 2023-03-15
  Administered 2023-03-15: .2 mg via INTRAVENOUS

## 2023-03-15 MED ORDER — HEPARIN 6000 UNIT IRRIGATION SOLUTION
Status: DC | PRN
  Administered 2023-03-15: 1

## 2023-03-15 MED ORDER — SUGAMMADEX SODIUM 200 MG/2ML IV SOLN
INTRAVENOUS | Status: DC | PRN
  Administered 2023-03-15: 200 mg via INTRAVENOUS

## 2023-03-15 MED ORDER — HEPARIN SODIUM (PORCINE) 1000 UNIT/ML IJ SOLN
INTRAMUSCULAR | Status: DC | PRN
  Administered 2023-03-15: 2800 [IU] via INTRAVENOUS

## 2023-03-15 MED ORDER — PROPOFOL 10 MG/ML IV BOLUS
INTRAVENOUS | Status: AC
  Filled 2023-03-15: qty 20

## 2023-03-15 MED ORDER — SODIUM CHLORIDE 0.9 % IV SOLN: INTRAVENOUS | Status: DC

## 2023-03-15 MED ORDER — SODIUM CHLORIDE 0.9% FLUSH: 10.0000 mL | INTRAVENOUS | Status: DC | PRN

## 2023-03-15 MED ORDER — DEXAMETHASONE SODIUM PHOSPHATE 10 MG/ML IJ SOLN
INTRAMUSCULAR | Status: AC
  Filled 2023-03-15: qty 1

## 2023-03-15 MED ORDER — SODIUM CHLORIDE 0.9 % IV SOLN
INTRAVENOUS | Status: DC | PRN
Start: 2023-03-15 — End: 2023-03-15

## 2023-03-15 MED ORDER — 0.9 % SODIUM CHLORIDE (POUR BTL) OPTIME
TOPICAL | Status: DC | PRN
  Administered 2023-03-15: 1000 mL

## 2023-03-15 MED ORDER — ORAL CARE MOUTH RINSE: 15.0000 mL | Freq: Once | OROMUCOSAL | Status: AC

## 2023-03-15 MED ORDER — HYDROMORPHONE HCL 1 MG/ML IJ SOLN
0.5000 mg | INTRAMUSCULAR | Status: DC | PRN
  Administered 2023-03-29: 0.5 mg via INTRAVENOUS
  Filled 2023-03-15: qty 0.5

## 2023-03-15 MED ORDER — SODIUM CHLORIDE 0.9% FLUSH
10.0000 mL | Freq: Two times a day (BID) | INTRAVENOUS | Status: DC
  Administered 2023-03-16 – 2023-03-29 (×14): 10 mL

## 2023-03-15 MED ORDER — FENTANYL CITRATE (PF) 250 MCG/5ML IJ SOLN
INTRAMUSCULAR | Status: AC
  Filled 2023-03-15: qty 5

## 2023-03-15 MED ORDER — OXYCODONE HCL 5 MG/5ML PO SOLN: 5.0000 mg | Freq: Once | ORAL | Status: DC | PRN

## 2023-03-15 MED ORDER — FENTANYL CITRATE (PF) 100 MCG/2ML IJ SOLN: 25.0000 ug | INTRAMUSCULAR | Status: DC | PRN

## 2023-03-15 MED ORDER — ACETAMINOPHEN 10 MG/ML IV SOLN: 1000.0000 mg | Freq: Once | INTRAVENOUS | Status: DC | PRN

## 2023-03-15 MED ORDER — DEXAMETHASONE SODIUM PHOSPHATE 10 MG/ML IJ SOLN
INTRAMUSCULAR | Status: DC | PRN
  Administered 2023-03-15: 10 mg via INTRAVENOUS

## 2023-03-15 MED ORDER — ONDANSETRON HCL 4 MG/2ML IJ SOLN
INTRAMUSCULAR | Status: DC | PRN
  Administered 2023-03-15: 4 mg via INTRAVENOUS

## 2023-03-15 MED ORDER — ONDANSETRON HCL 4 MG/2ML IJ SOLN
INTRAMUSCULAR | Status: AC
  Filled 2023-03-15: qty 2

## 2023-03-15 MED ORDER — PROPOFOL 10 MG/ML IV BOLUS
INTRAVENOUS | Status: DC | PRN
  Administered 2023-03-15: 110 mg via INTRAVENOUS

## 2023-03-15 MED ORDER — OXYCODONE-ACETAMINOPHEN 5-325 MG PO TABS
1.0000 | ORAL_TABLET | ORAL | Status: DC | PRN
  Administered 2023-03-16 – 2023-03-24 (×4): 1 via ORAL
  Filled 2023-03-15 (×5): qty 1

## 2023-03-15 MED ORDER — CHLORHEXIDINE GLUCONATE 0.12 % MT SOLN
15.0000 mL | OROMUCOSAL | Status: AC
  Filled 2023-03-15 (×2): qty 15

## 2023-03-15 MED ORDER — CHLORHEXIDINE GLUCONATE 0.12 % MT SOLN
15.0000 mL | Freq: Once | OROMUCOSAL | Status: AC
  Administered 2023-03-15: 15 mL via OROMUCOSAL

## 2023-03-15 MED ORDER — OXYCODONE HCL 5 MG PO TABS: 5.0000 mg | ORAL_TABLET | Freq: Once | ORAL | Status: DC | PRN

## 2023-03-15 MED ORDER — FENTANYL CITRATE (PF) 250 MCG/5ML IJ SOLN
INTRAMUSCULAR | Status: DC | PRN
  Administered 2023-03-15: 100 ug via INTRAVENOUS

## 2023-03-15 MED ORDER — ROCURONIUM BROMIDE 10 MG/ML (PF) SYRINGE
PREFILLED_SYRINGE | INTRAVENOUS | Status: AC
  Filled 2023-03-15: qty 10

## 2023-03-15 MED ORDER — LIDOCAINE 2% (20 MG/ML) 5 ML SYRINGE
INTRAMUSCULAR | Status: DC | PRN
  Administered 2023-03-15: 100 mg via INTRAVENOUS

## 2023-03-15 MED ORDER — LIDOCAINE 2% (20 MG/ML) 5 ML SYRINGE
INTRAMUSCULAR | Status: AC
  Filled 2023-03-15: qty 5

## 2023-03-15 MED ORDER — ROCURONIUM BROMIDE 10 MG/ML (PF) SYRINGE
PREFILLED_SYRINGE | INTRAVENOUS | Status: DC | PRN
  Administered 2023-03-15: 10 mg via INTRAVENOUS
  Administered 2023-03-15: 60 mg via INTRAVENOUS

## 2023-03-15 SURGICAL SUPPLY — 69 items
ADH SKN CLS APL DERMABOND .7 (GAUZE/BANDAGES/DRESSINGS) ×2
APL PRP STRL LF DISP 70% ISPRP (MISCELLANEOUS) ×2
APL SKNCLS STERI-STRIP NONHPOA (GAUZE/BANDAGES/DRESSINGS) ×2
ARMBAND PINK RESTRICT EXTREMIT (MISCELLANEOUS) ×4 IMPLANT
BAG COUNTER SPONGE SURGICOUNT (BAG) ×2 IMPLANT
BAG DECANTER FOR FLEXI CONT (MISCELLANEOUS) ×2 IMPLANT
BAG SPNG CNTER NS LX DISP (BAG) ×2
BENZOIN TINCTURE PRP APPL 2/3 (GAUZE/BANDAGES/DRESSINGS) ×2 IMPLANT
BIOPATCH RED 1 DISK 7.0 (GAUZE/BANDAGES/DRESSINGS) ×2 IMPLANT
BNDG CMPR 6 X 5 YARDS HK CLSR (GAUZE/BANDAGES/DRESSINGS) ×4
BNDG ELASTIC 6INX 5YD STR LF (GAUZE/BANDAGES/DRESSINGS) IMPLANT
BNDG GAUZE DERMACEA FLUFF 4 (GAUZE/BANDAGES/DRESSINGS) IMPLANT
BNDG GZE DERMACEA 4 6PLY (GAUZE/BANDAGES/DRESSINGS) ×2
CANISTER SUCT 3000ML PPV (MISCELLANEOUS) ×2 IMPLANT
CATH PALINDROME-P 19CM W/VT (CATHETERS) IMPLANT
CATH PALINDROME-P 23CM W/VT (CATHETERS) IMPLANT
CATH PALINDROME-P 28CM W/VT (CATHETERS) IMPLANT
CHLORAPREP W/TINT 26 (MISCELLANEOUS) ×2 IMPLANT
CONT SPEC 4OZ CLIKSEAL STRL BL (MISCELLANEOUS) IMPLANT
COVER DOME SNAP 22 D (MISCELLANEOUS) IMPLANT
COVER PROBE W GEL 5X96 (DRAPES) ×2 IMPLANT
COVER SURGICAL LIGHT HANDLE (MISCELLANEOUS) ×2 IMPLANT
DERMABOND ADVANCED .7 DNX12 (GAUZE/BANDAGES/DRESSINGS) IMPLANT
DRAPE C-ARM 42X72 X-RAY (DRAPES) ×2 IMPLANT
DRAPE CHEST BREAST 15X10 FENES (DRAPES) ×2 IMPLANT
ELECT REM PT RETURN 9FT ADLT (ELECTROSURGICAL) ×2
ELECTRODE REM PT RTRN 9FT ADLT (ELECTROSURGICAL) ×2 IMPLANT
GAUZE 4X4 16PLY ~~LOC~~+RFID DBL (SPONGE) ×2 IMPLANT
GAUZE SPONGE 4X4 12PLY STRL (GAUZE/BANDAGES/DRESSINGS) ×2 IMPLANT
GLOVE BIO SURGEON STRL SZ8 (GLOVE) ×2 IMPLANT
GLOVE BIOGEL PI IND STRL 6 (GLOVE) IMPLANT
GOWN STRL REUS W/ TWL LRG LVL3 (GOWN DISPOSABLE) ×4 IMPLANT
GOWN STRL REUS W/ TWL XL LVL3 (GOWN DISPOSABLE) ×2 IMPLANT
GOWN STRL REUS W/TWL LRG LVL3 (GOWN DISPOSABLE) ×4
GOWN STRL REUS W/TWL XL LVL3 (GOWN DISPOSABLE) ×2
INSERT FOGARTY SM (MISCELLANEOUS) IMPLANT
KIT BASIN OR (CUSTOM PROCEDURE TRAY) ×2 IMPLANT
KIT FULL HD TRIALYSIS 13X20 (MISCELLANEOUS) IMPLANT
KIT PALINDROME-P 55CM (CATHETERS) IMPLANT
KIT TURNOVER KIT B (KITS) ×2 IMPLANT
NDL 18GX1X1/2 (RX/OR ONLY) (NEEDLE) ×2 IMPLANT
NDL HYPO 25GX1X1/2 BEV (NEEDLE) ×2 IMPLANT
NEEDLE 18GX1X1/2 (RX/OR ONLY) (NEEDLE) ×2 IMPLANT
NEEDLE HYPO 25GX1X1/2 BEV (NEEDLE) ×2 IMPLANT
NS IRRIG 1000ML POUR BTL (IV SOLUTION) ×2 IMPLANT
PACK BASIC III (CUSTOM PROCEDURE TRAY) ×2
PACK CV ACCESS (CUSTOM PROCEDURE TRAY) ×2 IMPLANT
PACK SRG BSC III STRL LF ECLPS (CUSTOM PROCEDURE TRAY) ×2 IMPLANT
PAD ARMBOARD 7.5X6 YLW CONV (MISCELLANEOUS) ×4 IMPLANT
SET MICROPUNCTURE 5F STIFF (MISCELLANEOUS) ×2 IMPLANT
SOAP 2 % CHG 4 OZ (WOUND CARE) ×2 IMPLANT
STAPLER VISISTAT 35W (STAPLE) IMPLANT
STRIP CLOSURE SKIN 1/2X4 (GAUZE/BANDAGES/DRESSINGS) ×2 IMPLANT
SUT ETHILON 3 0 PS 1 (SUTURE) ×2 IMPLANT
SUT MNCRL AB 4-0 PS2 18 (SUTURE) ×2 IMPLANT
SUT PROLENE 6 0 BV (SUTURE) ×4 IMPLANT
SUT VIC AB 3-0 SH 27 (SUTURE) ×4
SUT VIC AB 3-0 SH 27X BRD (SUTURE) ×4 IMPLANT
SWAB COLLECTION DEVICE MRSA (MISCELLANEOUS) IMPLANT
SWAB CULTURE ESWAB REG 1ML (MISCELLANEOUS) IMPLANT
SYR 10ML LL (SYRINGE) ×2 IMPLANT
SYR 20ML LL LF (SYRINGE) ×4 IMPLANT
SYR 3ML LL SCALE MARK (SYRINGE) IMPLANT
SYR 5ML LL (SYRINGE) ×2 IMPLANT
SYR CONTROL 10ML LL (SYRINGE) ×2 IMPLANT
TOWEL GREEN STERILE (TOWEL DISPOSABLE) ×2 IMPLANT
TOWEL GREEN STERILE FF (TOWEL DISPOSABLE) ×4 IMPLANT
UNDERPAD 30X36 HEAVY ABSORB (UNDERPADS AND DIAPERS) ×2 IMPLANT
WATER STERILE IRR 1000ML POUR (IV SOLUTION) ×2 IMPLANT

## 2023-03-15 NOTE — Progress Notes (Signed)
[facility]    Date of Admission:  03/06/2023   Total days of antibiotics 10           ID: [name] [name] is a 62 y.o. male with  MRSA/enterococcal bacteremia Principal Problem:   Acute metabolic encephalopathy Active Problems:   Essential hypertension   Neuropathy   Anemia of chronic disease   Hypokalemia   Hypothyroidism   GERD (gastroesophageal reflux disease)   ESRD (end stage renal disease) (HCC)    Subjective: Afebrile. Less pain to left arm avf. Denies cough. Awaiting surgery  Medications:   amLODipine  5 mg Oral q morning   atorvastatin  20 mg Oral q morning   chlorhexidine  15 mL Mouth/Throat NOW   Chlorhexidine Gluconate Cloth  6 each Topical Q0600   Chlorhexidine Gluconate Cloth  6 each Topical Q0600   Chlorhexidine Gluconate Cloth  6 each Topical Q0600   darbepoetin (ARANESP) injection - DIALYSIS  60 mcg Subcutaneous Q Wed-1800   feeding supplement (NEPRO CARB STEADY)  237 mL Oral TID BM   influenza vac split trivalent PF  0.5 mL Intramuscular Tomorrow-1000   insulin aspart  0-9 Units Subcutaneous TID WC   levothyroxine  25 mcg Oral QAC breakfast   liver oil-zinc oxide   Topical Daily   metoprolol tartrate  50 mg Oral BID   multivitamin with minerals  1 tablet Oral Daily   mupirocin ointment   Nasal BID   pantoprazole  40 mg Oral Daily   sodium chloride flush  3 mL Intravenous Q12H    Objective: Vital signs in last 24 hours: Temp:  [97 F (36.1 C)-98.4 F (36.9 C)] 97 F (36.1 C) (10/02 1800) Pulse Rate:  [60-81] 65 (10/02 1800) Resp:  [10-18] 12 (10/02 1800) BP: (107-198)/(64-92) 141/82 (10/02 1800) SpO2:  [93 %-100 %] 100 % (10/02 1800) Physical Exam  Constitutional: He is oriented to person, place, and time. He appears well-developed and well-nourished. No distress.  HENT:  Mouth/Throat: Oropharynx is clear and moist. No oropharyngeal exudate.  Cardiovascular: Normal rate, regular rhythm and normal heart sounds. Exam  reveals no gallop and no friction rub.  No murmur heard.  Pulmonary/Chest: Effort normal and breath sounds normal. No respiratory distress. He has no wheezes.  Abdominal: Soft. Bowel sounds are normal. He exhibits no distension. There is no tenderness.  ZOX:WRUE arm thrill. Slight warmth. No erythema Neurological: He is alert and oriented to person, place, and time.  Skin: Skin is warm and dry. No rash noted. No erythema.  Psychiatric: He has a normal mood and affect. His behavior is normal.    Lab Results Recent Labs    03/13/23 0555 03/15/23 0420  WBC 5.6 4.9  HGB 9.9* 9.0*  HCT 31.2* 28.1*  NA 135 137  K 3.0* 3.4*  CL 101 100  CO2 24 26  BUN 29* 17  CREATININE 5.68* 4.06*   Liver Panel Recent Labs    03/14/23 1152 03/15/23 0420  PROT 6.5  --   ALBUMIN 2.1* 1.7*  AST 33  --   ALT 17  --   ALKPHOS 167*  --   BILITOT 0.9  --   BILIDIR 0.3*  --   IBILI 0.6  --    Sedimentation Rate No results for input(s): "ESRSEDRATE" in the last 72 hours. C-Reactive Protein No results for input(s): "CRP" in the last 72 hours.  Microbiology: 9/23 blood cx mrsa and enterococcus 9/24 blood cx MRSA Studies/Results: PERIPHERAL VASCULAR  CATHETERIZATION  Result Date: 03/15/2023 See surgical note for result.  HYBRID OR IMAGING ([facility] ONLY)  Result Date: 03/15/2023 There is no interpretation for this exam.  This order is for images obtained during a surgical procedure.  Please See "Surgeries" Tab for more information regarding the procedure.     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 Av graft to be removed by dr Lenell Antu today and also have placement of temp catheter.  Friday to have TEE  Covid-19 illness = mild disease. Off of isolation tomorrow  Enterococcal bacteremia = continue on vancomycin. Less likelihood to be polymicrobial endocarditis but await findings of TEE. He had MRSA on multiple cultures  [facility] Pager: [phone]  03/15/2023, 6:16 PM

## 2023-03-15 NOTE — Progress Notes (Signed)
[facility] Progress Note   Subjective:   Patient seen and examined in room.  Plan for AVG excision today.  Denies CP, SOB, abdominal pain and n/v/d. Discussed considering SNF placement and patient refuses, stating he wants to go home.   Objective Vitals:   03/14/23 1946 03/15/23 0528 03/15/23 0845 03/15/23 0906  BP: (!) 165/76 (!) 186/76 (!) 198/88 (S) (!) 187/84  Pulse: 81 68 74   Resp: 18 18 18    Temp: 98.4 F (36.9 C) 98.3 F (36.8 C) 98.3 F (36.8 C)   TempSrc:   Oral   SpO2: 99% 99% 100%   Weight:      Height:       Physical Exam General:chronically ill appearing male in NAD Heart:RRR, no mrg Lungs:CTAB, nml WOB on RA Abdomen:soft, NTND Extremities:no LE edema Dialysis Access: LU AVG +b/t   Filed Weights   03/07/23 1600 03/10/23 0945 03/10/23 1330  Weight: 63.1 kg 63.1 kg 62 kg    Intake/Output Summary (Last 24 hours) at 03/15/2023 1023 Last data filed at 03/15/2023 0918 Gross per 24 hour  Intake 240 ml  Output 1 ml  Net 239 ml    Additional Objective Labs: Basic Metabolic Panel: Recent Labs  Lab 03/11/23 1112 03/13/23 0555 03/15/23 0420  NA 134* 135 137  K 2.9* 3.0* 3.4*  CL 96* 101 100  CO2 27 24 26   GLUCOSE 140* 86 99  BUN 20 29* 17  CREATININE 4.56* 5.68* 4.06*  CALCIUM 7.0* 7.0* 7.1*  PHOS  --   --  2.1*   Liver Function Tests: Recent Labs  Lab 03/14/23 1152 03/15/23 0420  AST 33  --   ALT 17  --   ALKPHOS 167*  --   BILITOT 0.9  --   PROT 6.5  --   ALBUMIN 2.1* 1.7*   CBC: Recent Labs  Lab 03/09/23 1554 03/10/23 1005 03/13/23 0555 03/15/23 0420  WBC 6.6 6.9 5.6 4.9  HGB 11.8* 10.0* 9.9* 9.0*  HCT 37.0* 31.1* 31.2* 28.1*  MCV 87.7 86.6 90.2 88.1  PLT 86* 78* 99* 94*   Blood Culture    Component Value Date/Time   SDES BLOOD RIGHT ARM 03/09/2023 1554   SDES BLOOD RIGHT ARM 03/09/2023 1554   SPECREQUEST  03/09/2023 1554    BOTTLES DRAWN AEROBIC ONLY Blood Culture results may not be optimal due to an  excessive volume of blood received in culture bottles   SPECREQUEST  03/09/2023 1554    BOTTLES DRAWN AEROBIC AND ANAEROBIC Blood Culture adequate volume   CULT  03/09/2023 1554    NO GROWTH 5 DAYS Performed at [facility], [address] [place], [place] [address]    CULT  03/09/2023 1554    NO GROWTH 5 DAYS Performed at [facility], [address], [place], [place] [address]    REPTSTATUS 03/14/2023 FINAL 03/09/2023 1554   REPTSTATUS 03/14/2023 FINAL 03/09/2023 1554    Medications:  sodium chloride     vancomycin Stopped (03/14/23 0944)    amLODipine  5 mg Oral q morning   atorvastatin  20 mg Oral q morning   Chlorhexidine Gluconate Cloth  6 each Topical Q0600   Chlorhexidine Gluconate Cloth  6 each Topical Q0600   Chlorhexidine Gluconate Cloth  6 each Topical Q0600   darbepoetin (ARANESP) injection - DIALYSIS  60 mcg Subcutaneous Q Wed-1800   feeding supplement (NEPRO CARB STEADY)  237 mL Oral TID BM   influenza vac split trivalent  PF  0.5 mL Intramuscular Tomorrow-1000   insulin aspart  0-9 Units Subcutaneous TID WC   levothyroxine  25 mcg Oral QAC breakfast   liver oil-zinc oxide   Topical Daily   metoprolol tartrate  50 mg Oral BID   multivitamin with minerals  1 tablet Oral Daily   mupirocin ointment   Nasal BID   pantoprazole  40 mg Oral Daily   sodium chloride flush  3 mL Intravenous Q12H    Dialysis Orders: [facility],, MWF. 4 hours. EDW 64 kg. LUE AVG 15-gauge. Flow rates: 400/autoflow 1.5. 3K/2.5 calcium. No heparin. Mircera 75 mcg every 2 weeks (last dose 9/11). Venofer 50 mg q. weekly.    Assessment/Plan:   1.  MRSA and E faecalis bacteremia - ID following. On vancomycin. TEE planned for 10/4.  Concern for seeding of AVG, VVS consulted for excision.  Plan for removal today, 10/2, with temporary cath  placement d/t bacteremia.   ESRD- HD MWF. Plan for HD today after surgery per regular schedule.  COVID-19 PNA - on remdesivir on isolation.  HD at  bedside while on isolation.  Hypokalemia - chronic issue, liberalize diet. Order home dose - KCl daily. Use increased K bath.  Altered mental status - waxing and wanning.  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 Will need TDC placed prior to discharge once bacteremia resolved.  HTN/volume -BP elevated today, may need to titrate meds.  Hx hypotension.  Under edw, likely weight loss.  UF as tolerated. Plan for lower dry weight on d/c.  Anemia of CKD -Hgb 9.0, continue ESA 60 q. Wednesday HD Secondary hyperparathyroidism -placed on binders, corrected calcium at goal and phosphorus Nutrition - Carb modified diet w/Fluid restrictions.  DMT2 - per PMD Debility - chronic issue.  Would benefit from SNF placement but previously has refused. Falling at home per caretaker when discussed at outpatient HD. Cared for by 73 year old mother who does her best but can not pick him up when he falls per her own account. Refusing to consider SNF placement.   Virgina Norfolk, PA-C [facility] 03/15/2023,10:23 AM  LOS: 9 days

## 2023-03-15 NOTE — Progress Notes (Signed)
Pharmacy Antibiotic Note  [name] [name] is a 62 y.o. male admitted on 03/06/2023 with MRSA and E. 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  Pharmacy has been consulted for vancomycin dosing.  Assessment: Plans for HD today. Vanc random level, prior to HD: 24, with goal of 25. Anticipate reduction between 33-44% based on duration of HD today, expect post HD level between 13.44 and 16.08. Would anticipate a level of 500 mg to keep him at this level, however, patient received 2 doses of 750 mg post-HD on 9/27, which likely falsely elevates.  Due to this, will continue current 750 mg MWF w/ HD dosing.  Patient specific Vd 0.9 * 62kg = 55.8 L  Plan: Continue Vancomycin 750 mg IV qHD (MWF schedule) Monitor levels as indicated  Height: 5\' 10"  (177.8 cm) Weight: 62 kg (136 lb 11 oz) IBW/kg (Calculated) : 73  Temp (24hrs), Avg:98.3 F (36.8 C), Min:98 F (36.7 C), Max:98.4 F (36.9 C)  Recent Labs  Lab 03/09/23 1554 03/10/23 1005 03/11/23 1112 03/13/23 0555 03/15/23 0420 03/15/23 0921  WBC 6.6 6.9  --  5.6 4.9  --   CREATININE 5.28* 3.13* 4.56* 5.68* 4.06*  --   VANCORANDOM  --   --   --   --   --  24    Estimated Creatinine Clearance: 16.1 mL/min (A) (by C-G formula based on SCr of 4.06 mg/dL (H)).    No Known Allergies  Antimicrobials this admission: Ceftriaxone 9/23 x 1 Vancomycin 9/24 >>   Microbiology results: 9/23 BCx: 2/2 MRSA, 1/2 E. Faecalis 9/24 BCx: 2/2 MRSA 9/26 BCx: No growth for 5 days, finalized  Thank you for allowing pharmacy to be a part of this patient's care.  Lora Paula, PharmD PGY-2 Infectious Diseases Pharmacy Resident 03/15/2023 1:08 PM

## 2023-03-15 NOTE — Progress Notes (Signed)
PROGRESS NOTE    [name] [name]  ZOX:[id number] DOB: 10/25/1960 DOA: 03/06/2023 PCP: Farris Has, MD   Brief Narrative: 62 year old with past medical history significant for ESRD on hemodialysis MWF, hypertension, diabetes type 2, pulmonary hypertension, cardiomyopathy, GERD, dyslipidemia, anemia of CKD presents to the ED with worsening mental status.  He was admitted with acute metabolic encephalopathy secondary to UTI and COVID-pneumonia and also missed hemodialysis sections.  He was found to have MRSA and enterococcal bacteremia. He was started on Vancomycin.  ID has been following.  Patient was transferred to [facility] for fistula removal.  Vascular surgery was notified.     Assessment & Plan:   Principal Problem:   Acute metabolic encephalopathy Active Problems:   Essential hypertension   Neuropathy   Anemia of chronic disease   Hypokalemia   Hypothyroidism   GERD (gastroesophageal reflux disease)   ESRD (end stage renal disease) (HCC)  1-MRSA and Enterococcus bacteremia -No significant findings on ultrasound of fistula or CT maxillofacial  -ID following -Concern  for infection in the AV graft.  Plan for excision of graft today 10/2 -Echocardiogram raise concern for endocarditis -TEE is being pursued -Due to COVID cardiology cannot do this procedure until later this week.  Tentatively scheduled 10/4   COVID infection Completed course of Remdesivir Completed steroid.  Continue isolation.  He tested +9/23.  10-day will be 10/3   ESRD on hemodialysis MWF Managed per nephrology  Acute metabolic encephalopathy In the setting of UTI/COVID, missed hemodialysis and bacteremia Improving Likely has some cognitive impairment at baseline   Anemia of chronic kidney disease Stable  Hypertension Continue with metoprolol and amlodipine.   Diabetes type 2: Continue with sliding scale insulin  Hypothyroidism Continue with Synthroid  GERD: Continue  with PPI    Thrombocytopenia.  Improving.    Hypoalbuminemia: On supplements       Nutrition Problem: Increased nutrient needs Etiology: chronic illness (ESRD on HD)    Signs/Symptoms: estimated needs    Interventions: Nepro shake, MVI  Estimated body mass index is 19.61 kg/m as calculated from the following:   Height as of this encounter: 5\' 10"  (1.778 m).   Weight as of this encounter: 62 kg.   DVT prophylaxis: SCDs Code Status: Full code Family Communication: No family at bedside Disposition Plan:  Status is: Inpatient Remains inpatient appropriate because: Management of COVID, bacteremia    Consultants:  Nephrology ID Vascular-Dr. Lenell Antu Procedures:    Antimicrobials:    Subjective: He is alert, asking for toilet paper, he is hungry, awaiting to have procedure.  Denies cough, dyspnea.   Objective: Vitals:   03/14/23 0841 03/14/23 1611 03/14/23 1946 03/15/23 0528  BP: 139/76 (!) 162/67 (!) 165/76 (!) 186/76  Pulse: 88 83 81 68  Resp: 20 18 18 18   Temp: 98.1 F (36.7 C) 98 F (36.7 C) 98.4 F (36.9 C) 98.3 F (36.8 C)  TempSrc: Oral     SpO2: 100% 100% 99% 99%  Weight:      Height:        Intake/Output Summary (Last 24 hours) at 03/15/2023 0818 Last data filed at 03/15/2023 0655 Gross per 24 hour  Intake 360 ml  Output 2001 ml  Net -1641 ml   Filed Weights   03/07/23 1600 03/10/23 0945 03/10/23 1330  Weight: 63.1 kg 63.1 kg 62 kg    Examination:  General exam: Appears calm and comfortable  Respiratory system: Clear to auscultation. Respiratory effort normal. Cardiovascular system: S1 & S2 heard, RRR.  No JVD, murmurs, rubs, gallops or clicks. No pedal edema. Gastrointestinal system: Abdomen is nondistended, soft and nontender. No organomegaly or masses felt. Normal bowel sounds heard. Central nervous system: Alert and oriented. No focal neurological deficits. Extremities: Symmetric 5 x 5 power.   Data Reviewed: I have personally reviewed  following labs and imaging studies  CBC: Recent Labs  Lab 03/09/23 1554 03/10/23 1005 03/13/23 0555 03/15/23 0420  WBC 6.6 6.9 5.6 4.9  HGB 11.8* 10.0* 9.9* 9.0*  HCT 37.0* 31.1* 31.2* 28.1*  MCV 87.7 86.6 90.2 88.1  PLT 86* 78* 99* 94*   Basic Metabolic Panel: Recent Labs  Lab 03/09/23 1554 03/10/23 1005 03/11/23 1112 03/13/23 0555  NA 135 134* 134* 135  K 3.2* 2.8* 2.9* 3.0*  CL 94* 100 96* 101  CO2 26 27 27 24   GLUCOSE 148* 113* 140* 86  BUN 24* 14 20 29*  CREATININE 5.28* 3.13* 4.56* 5.68*  CALCIUM 7.1* 6.8* 7.0* 7.0*  MG 1.7 1.5*  --   --    GFR: Estimated Creatinine Clearance: 11.5 mL/min (A) (by C-G formula based on SCr of 5.68 mg/dL (H)). Liver Function Tests: Recent Labs  Lab 03/14/23 1152  AST 33  ALT 17  ALKPHOS 167*  BILITOT 0.9  PROT 6.5  ALBUMIN 2.1*   No results for input(s): "LIPASE", "AMYLASE" in the last 168 hours. No results for input(s): "AMMONIA" in the last 168 hours. Coagulation Profile: No results for input(s): "INR", "PROTIME" in the last 168 hours. Cardiac Enzymes: No results for input(s): "CKTOTAL", "CKMB", "CKMBINDEX", "TROPONINI" in the last 168 hours. BNP (last 3 results) No results for input(s): "PROBNP" in the last 8760 hours. HbA1C: No results for input(s): "HGBA1C" in the last 72 hours. CBG: Recent Labs  Lab 03/13/23 2035 03/14/23 1007 03/14/23 1110 03/14/23 1610 03/14/23 1947  GLUCAP 163* 68* 84 131* 119*   Lipid Profile: No results for input(s): "CHOL", "HDL", "LDLCALC", "TRIG", "CHOLHDL", "LDLDIRECT" in the last 72 hours. Thyroid Function Tests: No results for input(s): "TSH", "T4TOTAL", "FREET4", "T3FREE", "THYROIDAB" in the last 72 hours. Anemia Panel: No results for input(s): "VITAMINB12", "FOLATE", "FERRITIN", "TIBC", "IRON", "RETICCTPCT" in the last 72 hours. Sepsis Labs: No results for input(s): "PROCALCITON", "LATICACIDVEN" in the last 168 hours.  Recent Results (from the past 240 hour(s))  SARS  Coronavirus 2 by RT PCR (hospital order, performed in [facility] hospital lab) *cepheid single result test* Anterior Nasal Swab     Status: Abnormal   Collection Time: 03/06/23 10:36 AM   Specimen: Anterior Nasal Swab  Result Value Ref Range Status   SARS Coronavirus 2 by RT PCR POSITIVE (A) NEGATIVE Final    Comment: (NOTE) SARS-CoV-2 target nucleic acids are DETECTED  SARS-CoV-2 RNA is generally detectable in upper respiratory specimens  during the acute phase of infection.  Positive results are indicative  of the presence of the identified virus, but do not rule out bacterial infection or co-infection with other pathogens not detected by the test.  Clinical correlation with patient history and  other diagnostic information is necessary to determine patient infection status.  The expected result is negative.  Fact Sheet for Patients:   RoadLapTop.co.za   Fact Sheet for Healthcare Providers:   http://kim-miller.com/    This test is not yet approved or cleared by the [place] FDA and  has been authorized for detection and/or diagnosis of SARS-CoV-2 by FDA under an Emergency Use Authorization (EUA).  This EUA will remain in effect (meaning this test can  be used) for the duration of  the COVID-19 declaration under Section 564(b)(1)  of the Act, 21 U.S.C. section 360-bbb-3(b)(1), unless the authorization is terminated or revoked sooner.   Performed at [facility], [address] [place], [place] [address]   Culture, blood (routine x 2)     Status: Abnormal   Collection Time: 03/06/23 11:35 AM   Specimen: BLOOD  Result Value Ref Range Status   Specimen Description   Final    BLOOD BLOOD RIGHT ARM Performed at [facility], [address] [place], [place] [address]    Special Requests   Final    BOTTLES DRAWN AEROBIC ONLY Blood Culture results may not be optimal due to an inadequate volume of blood received in culture  bottles Performed at [facility], [address], [place], [place] [address]    Culture  Setup Time   Final    GRAM POSITIVE COCCI AEROBIC BOTTLE ONLY Gram Stain Report Called to,Read Back By and Verified With: DILDY,V. ON 03/07/2023 AT 1140 BY FRATTO,A. Performed at [facility], [address], [place], [place] [address]    Culture (A)  Final    STAPHYLOCOCCUS AUREUS SUSCEPTIBILITIES PERFORMED ON PREVIOUS CULTURE WITHIN THE LAST 5 DAYS. STAPHYLOCOCCUS CAPITIS THE SIGNIFICANCE OF ISOLATING THIS ORGANISM FROM A SINGLE SET OF BLOOD CULTURES WHEN MULTIPLE SETS ARE DRAWN IS UNCERTAIN. PLEASE NOTIFY THE MICROBIOLOGY DEPARTMENT WITHIN ONE WEEK IF SPECIATION AND SENSITIVITIES ARE REQUIRED. Performed at [facility], [address], [place], [place] [address]    Report Status 03/10/2023 FINAL  Final  Culture, blood (routine x 2)     Status: Abnormal   Collection Time: 03/06/23 11:35 AM   Specimen: BLOOD  Result Value Ref Range Status   Specimen Description   Final    BLOOD BLOOD LEFT HAND Performed at [facility], [address], [place], [place] [address]    Special Requests   Final    BOTTLES DRAWN AEROBIC ONLY Blood Culture adequate volume Performed at [facility], [address], [place], [place] [address]    Culture  Setup Time   Final    GRAM POSITIVE COCCI AEROBIC BOTTLE ONLY CRITICAL RESULT CALLED TO, READ BACK BY AND VERIFIED WITH: [facility] WEAVER @ 9629 ON 03/07/23 C VARNER CRITICAL RESULT CALLED TO, READ BACK BY AND VERIFIED WITH: Dorann Lodge MENDENHALL [id number] AT 1019 BY CM Performed at [facility], [address] [place], [place] [address]    Culture (A)  Final    METHICILLIN RESISTANT STAPHYLOCOCCUS AUREUS ENTEROCOCCUS FAECALIS    Report Status 03/10/2023 FINAL  Final   Organism ID, Bacteria METHICILLIN RESISTANT STAPHYLOCOCCUS AUREUS  Final   Organism ID, Bacteria ENTEROCOCCUS FAECALIS  Final      Susceptibility   Enterococcus faecalis - MIC*    AMPICILLIN  <=2 SENSITIVE Sensitive     VANCOMYCIN 1 SENSITIVE Sensitive     GENTAMICIN SYNERGY SENSITIVE Sensitive     * ENTEROCOCCUS FAECALIS   Methicillin resistant staphylococcus aureus - MIC*    CIPROFLOXACIN >=8 RESISTANT Resistant     ERYTHROMYCIN >=8 RESISTANT Resistant     GENTAMICIN <=0.5 SENSITIVE Sensitive     OXACILLIN >=4 RESISTANT Resistant     TETRACYCLINE <=1 SENSITIVE Sensitive     VANCOMYCIN 1 SENSITIVE Sensitive     TRIMETH/SULFA >=320 RESISTANT Resistant     CLINDAMYCIN <=0.25 SENSITIVE Sensitive     RIFAMPIN <=0.5 SENSITIVE Sensitive     Inducible Clindamycin NEGATIVE Sensitive  LINEZOLID 2 SENSITIVE Sensitive     * METHICILLIN RESISTANT STAPHYLOCOCCUS AUREUS  Blood Culture ID Panel (Reflexed)     Status: Abnormal   Collection Time: 03/06/23 11:35 AM  Result Value Ref Range Status   Enterococcus faecalis DETECTED (A) NOT DETECTED Final    Comment: CRITICAL RESULT CALLED TO, READ BACK BY AND VERIFIED WITH: PHARMD J MENEDENHALL [id number] AT 1019 BY CM    Enterococcus Faecium NOT DETECTED NOT DETECTED Final   Listeria monocytogenes NOT DETECTED NOT DETECTED Final   Staphylococcus species DETECTED (A) NOT DETECTED Final    Comment: CRITICAL RESULT CALLED TO, READ BACK BY AND VERIFIED WITH: PHARMD J MENDENHALL [id number] AT 1019 BY CM    Staphylococcus aureus (BCID) DETECTED (A) NOT DETECTED Final    Comment: Methicillin (oxacillin)-resistant Staphylococcus aureus (MRSA). MRSA is predictably resistant to beta-lactam antibiotics (except ceftaroline). Preferred therapy is vancomycin unless clinically contraindicated. Patient requires contact precautions if  hospitalized. CRITICAL RESULT CALLED TO, READ BACK BY AND VERIFIED WITH: PHARMD J MENEDENHALL [id number] AT 1019 BY CM    Staphylococcus epidermidis NOT DETECTED NOT DETECTED Final   Staphylococcus lugdunensis NOT DETECTED NOT DETECTED Final   Streptococcus species NOT DETECTED NOT DETECTED Final   Streptococcus agalactiae NOT  DETECTED NOT DETECTED Final   Streptococcus pneumoniae NOT DETECTED NOT DETECTED Final   Streptococcus pyogenes NOT DETECTED NOT DETECTED Final   A.calcoaceticus-baumannii NOT DETECTED NOT DETECTED Final   Bacteroides fragilis NOT DETECTED NOT DETECTED Final   Enterobacterales NOT DETECTED NOT DETECTED Final   Enterobacter cloacae complex NOT DETECTED NOT DETECTED Final   Escherichia coli NOT DETECTED NOT DETECTED Final   Klebsiella aerogenes NOT DETECTED NOT DETECTED Final   Klebsiella oxytoca NOT DETECTED NOT DETECTED Final   Klebsiella pneumoniae NOT DETECTED NOT DETECTED Final   Proteus species NOT DETECTED NOT DETECTED Final   Salmonella species NOT DETECTED NOT DETECTED Final   Serratia marcescens NOT DETECTED NOT DETECTED Final   Haemophilus influenzae NOT DETECTED NOT DETECTED Final   Neisseria meningitidis NOT DETECTED NOT DETECTED Final   Pseudomonas aeruginosa NOT DETECTED NOT DETECTED Final   Stenotrophomonas maltophilia NOT DETECTED NOT DETECTED Final   Candida albicans NOT DETECTED NOT DETECTED Final   Candida auris NOT DETECTED NOT DETECTED Final   Candida glabrata NOT DETECTED NOT DETECTED Final   Candida krusei NOT DETECTED NOT DETECTED Final   Candida parapsilosis NOT DETECTED NOT DETECTED Final   Candida tropicalis NOT DETECTED NOT DETECTED Final   Cryptococcus neoformans/gattii NOT DETECTED NOT DETECTED Final   Meth resistant mecA/C and MREJ DETECTED (A) NOT DETECTED Final    Comment: CRITICAL RESULT CALLED TO, READ BACK BY AND VERIFIED WITH: PHARMD J MENEDENHALL [id number] AT 1019 BY CM    Vancomycin resistance NOT DETECTED NOT DETECTED Final    Comment: Performed at [facility], [address] [place], [place] [address]  MRSA Next Gen by PCR, Nasal     Status: Abnormal   Collection Time: 03/06/23  6:44 PM   Specimen: Nasal Mucosa; Nasal Swab  Result Value Ref Range Status   MRSA by PCR Next Gen DETECTED (A) NOT DETECTED Final    Comment: RESULT CALLED TO,  READ BACK BY AND VERIFIED WITH: [facility] BEAVER 2104 03/06/23 BY VIRAY,J (NOTE) The GeneXpert MRSA Assay (FDA approved for NASAL specimens only), is one component of a comprehensive MRSA colonization surveillance program. It is not intended to diagnose MRSA infection nor to guide or monitor treatment for MRSA infections. Test performance  is not FDA approved in patients less than 37 years old. Performed at [facility], [address] [place], [place] [address]   Culture, blood (Routine X 2) w Reflex to ID Panel     Status: Abnormal   Collection Time: 03/07/23 12:08 PM   Specimen: BLOOD  Result Value Ref Range Status   Specimen Description   Final    BLOOD ARTERIAL DRAW Performed at [facility], [address], [place], [place] [address]    Special Requests   Final    BOTTLES DRAWN AEROBIC AND ANAEROBIC Blood Culture results may not be optimal due to an excessive volume of blood received in culture bottles Performed at [facility], [address], [place], [place] [address]    Culture  Setup Time   Final    GRAM POSITIVE COCCI IN BOTH AEROBIC AND ANAEROBIC BOTTLES CRITICAL RESULT CALLED TO, READ BACK BY AND VERIFIED WITH: K.THOMAS ON 03/08/2023 @14 :54 BY FRATTO,A  CRITICAL VALUE NOTED.  VALUE IS CONSISTENT WITH PREVIOUSLY REPORTED AND CALLED VALUE.    Culture (A)  Final    STAPHYLOCOCCUS AUREUS SUSCEPTIBILITIES PERFORMED ON PREVIOUS CULTURE WITHIN THE LAST 5 DAYS. Performed at [facility], [address], [place], [place] [address]    Report Status 03/10/2023 FINAL  Final  Culture, blood (Routine X 2) w Reflex to ID Panel     Status: Abnormal   Collection Time: 03/07/23 12:24 PM   Specimen: BLOOD  Result Value Ref Range Status   Specimen Description   Final    BLOOD ARTERIAL DRAW Performed at [facility], [address], [place], [place] [address]    Special Requests   Final    BOTTLES DRAWN AEROBIC AND ANAEROBIC Blood Culture results may not be optimal due to an  excessive volume of blood received in culture bottles Performed at [facility], [address], [place], [place] [address]    Culture  Setup Time   Final    GRAM POSITIVE COCCI IN BOTH AEROBIC AND ANAEROBIC BOTTLES CRITICAL RESULT CALLED TO, READ BACK BY AND VERIFIED WITH: B.DILDI ON 03/08/2023 @12 :27 BY T.HAMER CRITICAL VALUE NOTED.  VALUE IS CONSISTENT WITH PREVIOUSLY REPORTED AND CALLED VALUE.    Culture (A)  Final    STAPHYLOCOCCUS AUREUS SUSCEPTIBILITIES PERFORMED ON PREVIOUS CULTURE WITHIN THE LAST 5 DAYS. Performed at [facility], [address], [place], [place] [address]    Report Status 03/10/2023 FINAL  Final  Culture, blood (Routine X 2) w Reflex to ID Panel     Status: None   Collection Time: 03/09/23  3:54 PM   Specimen: BLOOD RIGHT ARM  Result Value Ref Range Status   Specimen Description BLOOD RIGHT ARM  Final   Special Requests   Final    BOTTLES DRAWN AEROBIC ONLY Blood Culture results may not be optimal due to an excessive volume of blood received in culture bottles   Culture   Final    NO GROWTH 5 DAYS Performed at [facility], [address], [place], [place] [address]    Report Status 03/14/2023 FINAL  Final  Culture, blood (Routine X 2) w Reflex to ID Panel     Status: None   Collection Time: 03/09/23  3:54 PM   Specimen: BLOOD RIGHT ARM  Result Value Ref Range Status   Specimen Description BLOOD RIGHT ARM  Final   Special Requests   Final    BOTTLES DRAWN AEROBIC AND ANAEROBIC Blood Culture adequate volume  Culture   Final    NO GROWTH 5 DAYS Performed at [facility], [address] [place], [place] [address]    Report Status 03/14/2023 FINAL  Final         Radiology Studies: No results found.      Scheduled Meds:  amLODipine  5 mg Oral q morning   atorvastatin  20 mg Oral q morning   Chlorhexidine Gluconate Cloth  6 each Topical Q0600   Chlorhexidine Gluconate Cloth  6 each Topical Q0600   Chlorhexidine Gluconate  Cloth  6 each Topical Q0600   darbepoetin (ARANESP) injection - DIALYSIS  60 mcg Subcutaneous Q Wed-1800   feeding supplement (NEPRO CARB STEADY)  237 mL Oral TID BM   influenza vac split trivalent PF  0.5 mL Intramuscular Tomorrow-1000   insulin aspart  0-9 Units Subcutaneous TID WC   levothyroxine  25 mcg Oral QAC breakfast   liver oil-zinc oxide   Topical Daily   metoprolol tartrate  50 mg Oral BID   multivitamin with minerals  1 tablet Oral Daily   mupirocin ointment   Nasal BID   pantoprazole  40 mg Oral Daily   sodium chloride flush  3 mL Intravenous Q12H   Continuous Infusions:  sodium chloride     vancomycin Stopped (03/14/23 0944)     LOS: 9 days    Time spent: 35 minutes    Gweneth Fredlund A Shalanda Brogden, MD [facility]   If 7PM-7AM, please contact night-coverage [email or web address]  03/15/2023, 8:18 AM

## 2023-03-15 NOTE — Interval H&P Note (Signed)
History and Physical Interval Note:  03/15/2023 2:08 PM  [name] [name]  has presented today for surgery, with the diagnosis of ESRD.  The various methods of treatment have been discussed with the patient and family. After consideration of risks, benefits and other options for treatment, the patient has consented to  Procedure(s): INSERTION OF TUNNELED DIALYSIS CATHETER (N/A) REMOVAL OF LEFT UPPER ARM ARTERIOVENOUS GORETEX GRAFT (AVGG) (Left) as a surgical intervention.  The patient's history has been reviewed, patient examined, no change in status, stable for surgery.  I have reviewed the patient's chart and labs.  Questions were answered to the patient's satisfaction.     [name]

## 2023-03-15 NOTE — Progress Notes (Signed)
PT Cancellation Note  Patient Details Name: [name] [name] MRN: [id number] DOB: 10/29/1960   Cancelled Treatment:    Reason Eval/Treat Not Completed: (P) Patient at procedure or test/unavailable (pt at OR for [facility] placement.) Will continue efforts per PT plan of care as schedule permits.   [name] Jakita Dutkiewicz 03/15/2023, 4:15 PM

## 2023-03-15 NOTE — Anesthesia Postprocedure Evaluation (Signed)
Anesthesia Post Note  Patient: [name] [name]  Procedure(s) Performed: INSERTION OF TUNNELED DIALYSIS CATHETER REMOVAL OF LEFT UPPER ARM ARTERIOVENOUS GORETEX GRAFT (AVGG) (Left)     Patient location during evaluation: PACU Anesthesia Type: General Level of consciousness: awake and alert Pain management: pain level controlled Vital Signs Assessment: post-procedure vital signs reviewed and stable Respiratory status: spontaneous breathing, nonlabored ventilation, respiratory function stable and patient connected to nasal cannula oxygen Cardiovascular status: blood pressure returned to baseline and stable Postop Assessment: no apparent nausea or vomiting Anesthetic complications: no   No notable events documented.  Last Vitals:  Vitals:   03/15/23 1639 03/15/23 1645  BP: 107/64 108/70  Pulse: 61 60  Resp: 16 17  Temp: (!) 36.1 C   SpO2: 93% 93%    Last Pain:  Vitals:   03/15/23 1639  TempSrc:   PainSc: 0-No pain                 Mariann Barter

## 2023-03-15 NOTE — Transfer of Care (Signed)
Immediate Anesthesia Transfer of Care Note  Patient: [name] [name]  Procedure(s) Performed: INSERTION OF TUNNELED DIALYSIS CATHETER REMOVAL OF LEFT UPPER ARM ARTERIOVENOUS GORETEX GRAFT (AVGG) (Left)  Patient Location: PACU  Anesthesia Type:General  Level of Consciousness: awake, alert , and oriented  Airway & Oxygen Therapy: Patient Spontanous Breathing and Patient connected to face mask oxygen  Post-op Assessment: Report given to RN and Post -op Vital signs reviewed and stable  Post vital signs: Reviewed and stable  Last Vitals:  Vitals Value Taken Time  BP 132/74   Temp 36.1 C 03/15/23 1639  Pulse 62 03/15/23 1643  Resp 20 03/15/23 1643  SpO2 97 % 03/15/23 1643  Vitals shown include unfiled device data.  Last Pain:  Vitals:   03/15/23 1340  TempSrc:   PainSc: 0-No pain      Patients Stated Pain Goal: 0 (03/11/23 2226)  Complications: No notable events documented.

## 2023-03-15 NOTE — Anesthesia Preprocedure Evaluation (Addendum)
Anesthesia Evaluation  Patient identified by MRN, date of birth, ID band Patient awake    Reviewed: Allergy & Precautions, NPO status , Patient's Chart, lab work & pertinent test results, reviewed documented beta blocker date and time   History of Anesthesia Complications Negative for: history of anesthetic complications  Airway Mallampati: III  TM Distance: >3 FB Neck ROM: Limited    Dental no notable dental hx.    Pulmonary neg sleep apnea, neg COPD, Recent URI  (covid 19), neg PE    + decreased breath sounds      Cardiovascular Exercise Tolerance: Poor METShypertension, pulmonary hypertension(-) angina +CHF  (-) CAD, (-) Past MI, (-) Orthopnea and (-) DOE  Rhythm:Regular Rate:Normal  Last TTE with essentially normal BiV and valvular function   Neuro/Psych neg Seizures    GI/Hepatic ,GERD  ,,(+) neg Cirrhosis        Endo/Other  diabetes, Type 2Hypothyroidism    Renal/GU ESRF and DialysisRenal disease     Musculoskeletal  (+) Arthritis ,    Abdominal   Peds  Hematology  (+) Blood dyscrasia, anemia   Anesthesia Other Findings   Reproductive/Obstetrics                              Anesthesia Physical Anesthesia Plan  ASA: 3  Anesthesia Plan: General   Post-op Pain Management:    Induction: Intravenous  PONV Risk Score and Plan: 2 and Ondansetron and Dexamethasone  Airway Management Planned: Oral ETT and Video Laryngoscope Planned  Additional Equipment:   Intra-op Plan:   Post-operative Plan: Extubation in OR  Informed Consent: I have reviewed the patients History and Physical, chart, labs and discussed the procedure including the risks, benefits and alternatives for the proposed anesthesia with the patient or authorized representative who has indicated his/her understanding and acceptance.     Dental advisory given  Plan Discussed with: CRNA  Anesthesia Plan  Comments:          Anesthesia Quick Evaluation

## 2023-03-15 NOTE — Op Note (Signed)
DATE OF SERVICE: 03/15/2023  PATIENT:  [name] [name]  62 y.o. 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 My assistant helped with exposure through counter tension, suctioning, ligation and retraction to [name] visualize the surgical field.  My assistant expedited sewing during the case by following my sutures. Wherever I use the term "we" in the report, my assistant actively helped me with that portion of the procedure.  ANESTHESIA:   general  EBL: 50mL  BLOOD ADMINISTERED:none  DRAINS: none   LOCAL MEDICATIONS USED:  NONE  SPECIMEN:  AVG for culture / gram stain  COUNTS: confirmed correct .  TOURNIQUET:    Total Tourniquet Time Documented: Upper Arm (Left) - 9 minutes Total: Upper Arm (Left) - 9 minutes   PATIENT DISPOSITION:  PACU - hemodynamically stable.   Delay start of Pharmacological VTE agent (>24hrs) due to surgical blood loss or risk of bleeding: no  INDICATION FOR PROCEDURE: [name] is a 62 y.o. 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 After careful discussion of risks, benefits, and alternatives the patient was offered AVG excision and placement of temporary dialysis catheter. The patient understood and wished to proceed.  OPERATIVE FINDINGS: AVG poorly incorporated, but no signs of gross infection. Vein patch to brachial artery. Doppler flow at wrist at completion.  DESCRIPTION OF PROCEDURE: After identification of the  patient in the pre-operative holding area, the patient was transferred to the operating room. The patient was positioned supine on the operating room table. Anesthesia was induced. The neck, chest, and left arm were prepped and draped in standard fashion. A surgical pause was performed confirming correct patient, procedure, and operative location.  Using ultrasound guidance the right internal jugular vein was accessed with micropuncture technique.  Through the micropuncture sheath a floppy J-wire was advanced into the inferior vena cava.  A small incision was made around the skin access point.  The access point was serially dilated under direct fluoroscopic guidance.  A 20 cm dialysis catheter was then advanced over the wire into the right atrium.  Adequate position was confirmed with x-ray.  The catheter was tested and found to flush and draw back well.  Catheter was heparin locked.  Caps were applied.  Catheter was sutured to the skin.   Previous scar in the distal arm was reopened sharply with a 10 blade.  Pneumatic tourniquet was applied to the upper arm.  The arm was exsanguinated with an Esmarch tourniquet.  Exposure of the arterial anastomosis arteriovenous graft was performed using careful blunt and sharp dissection.  Proximal and distal brachial artery was encircled with Silastic vessel loop.  The pneumatic tourniquet was released.  Clamps were applied to the proximal and distal brachial artery about the arterial anastomosis.  The arterial anastomosis was taken down sharply with an 11 blade.  All prosthetic material was removed.  Small segment of basilic vein was identified in the surgical bed.  Several centimeters of the basilic vein was skeletonized.  Right angle clamp was applied proximally distally.  The small segment was excised and opened longitudinally to serve as a patch angioplasty.  The distal and proximal stumps of the basilic vein were oversewn with 2-0 silk.  The vein patch was cut to size  and sewn directly to the arterial arteriotomy using continuous running suture of 6-0 Prolene.  Immediately prior to completion the patch was backbled.  2 repair stitches were needed.  The patch was then hemostatic.  Clamp was released on the inflow.  Hemostasis was again confirmed.  Doppler flow was confirmed across the repair.  Doppler flow was confirmed in the wrist.  Previous scar in the proximal arm was reopened sharply with a 10 blade.  Combination of blunt and sharp dissection was used to expose the end to end venous anastomosis.  A Gregory clamp was applied to the outflow vein.  The venous anastomosis was taken down sharply with an 11 blade.  All prosthetic material was removed.  The vein was ligated with 2-0 silk suture.  The graft was then removed from its tunnel manually.  This did not take much effort and was loosely incorporated as was the remainder of the graft.  This raises our concern for infection.  The wound was copiously irrigated.  We again confirmed the distal vascular exam.  The wound was closed in layers using 3-0 Vicryl and surgical stapler.  A bulky dressing was applied to the left arm.  Upon completion of the case instrument and sharps counts were confirmed correct. The patient was transferred to the PACU in good condition. I was present for all portions of the procedure.  FOLLOW UP PLAN: Assuming a normal postoperative course, I will see the patient in 2-3 weeks with no studies.   [name]. Lenell Antu, MD [facility] Phone Number: [phone] 03/15/2023 4:00 PM

## 2023-03-15 NOTE — Anesthesia Procedure Notes (Signed)
Procedure Name: Intubation Date/Time: 03/15/2023 2:53 PM  Performed by: Pincus Large, CRNAPre-anesthesia Checklist: Patient identified, Emergency Drugs available, Suction available and Patient being monitored Patient Re-evaluated:Patient Re-evaluated prior to induction Oxygen Delivery Method: Circle System Utilized Preoxygenation: Pre-oxygenation with 100% oxygen Induction Type: IV induction Ventilation: Mask ventilation without difficulty Laryngoscope Size: Mac and 3 Grade View: Grade II Tube type: Oral Tube size: 7.5 mm Number of attempts: 1 Airway Equipment and Method: Stylet and Oral airway Placement Confirmation: ETT inserted through vocal cords under direct vision, positive ETCO2 and breath sounds checked- equal and bilateral Secured at: 22 cm Tube secured with: Tape Dental Injury: Teeth and Oropharynx as per pre-operative assessment

## 2023-03-16 ENCOUNTER — Encounter (HOSPITAL_COMMUNITY): Payer: Self-pay | Admitting: Vascular Surgery

## 2023-03-16 DIAGNOSIS — G9341 Metabolic encephalopathy: Secondary | ICD-10-CM | POA: Diagnosis not present

## 2023-03-16 DIAGNOSIS — E43 Unspecified severe protein-calorie malnutrition: Secondary | ICD-10-CM | POA: Insufficient documentation

## 2023-03-16 LAB — CBC
HCT: 35.9 % — ABNORMAL LOW (ref 39.0–52.0)
Hemoglobin: 11.7 g/dL — ABNORMAL LOW (ref 13.0–17.0)
MCH: 29.3 pg (ref 26.0–34.0)
MCHC: 32.6 g/dL (ref 30.0–36.0)
MCV: 89.8 fL (ref 80.0–100.0)
Platelets: 127 10*3/uL — ABNORMAL LOW (ref 150–400)
RBC: 4 MIL/uL — ABNORMAL LOW (ref 4.22–5.81)
RDW: 15 % (ref 11.5–15.5)
WBC: 6.2 10*3/uL (ref 4.0–10.5)
nRBC: 0 % (ref 0.0–0.2)

## 2023-03-16 LAB — RENAL FUNCTION PANEL
Albumin: 1.8 g/dL — ABNORMAL LOW (ref 3.5–5.0)
Anion gap: 18 — ABNORMAL HIGH (ref 5–15)
BUN: 27 mg/dL — ABNORMAL HIGH (ref 8–23)
CO2: 20 mmol/L — ABNORMAL LOW (ref 22–32)
Calcium: 7.1 mg/dL — ABNORMAL LOW (ref 8.9–10.3)
Chloride: 96 mmol/L — ABNORMAL LOW (ref 98–111)
Creatinine, Ser: 4.87 mg/dL — ABNORMAL HIGH (ref 0.61–1.24)
GFR, Estimated: 13 mL/min — ABNORMAL LOW (ref >60)
Glucose, Bld: 149 mg/dL — ABNORMAL HIGH (ref 70–99)
Phosphorus: 4.4 mg/dL (ref 2.5–4.6)
Potassium: 4.3 mmol/L (ref 3.5–5.1)
Sodium: 134 mmol/L — ABNORMAL LOW (ref 135–145)

## 2023-03-16 LAB — GLUCOSE, CAPILLARY
Glucose-Capillary: 103 mg/dL — ABNORMAL HIGH (ref 70–99)
Glucose-Capillary: 136 mg/dL — ABNORMAL HIGH (ref 70–99)
Glucose-Capillary: 64 mg/dL — ABNORMAL LOW (ref 70–99)
Glucose-Capillary: 81 mg/dL (ref 70–99)

## 2023-03-16 MED ORDER — DARBEPOETIN ALFA 60 MCG/0.3ML IJ SOSY
60.0000 ug | PREFILLED_SYRINGE | INTRAMUSCULAR | Status: DC
  Administered 2023-03-24: 60 ug via SUBCUTANEOUS
  Filled 2023-03-16 (×3): qty 0.3

## 2023-03-16 MED ORDER — LIDOCAINE HCL (PF) 1 % IJ SOLN: 5.0000 mL | INTRAMUSCULAR | Status: DC | PRN

## 2023-03-16 MED ORDER — PENTAFLUOROPROP-TETRAFLUOROETH EX AERO: 1.0000 | INHALATION_SPRAY | CUTANEOUS | Status: DC | PRN

## 2023-03-16 MED ORDER — LIDOCAINE-PRILOCAINE 2.5-2.5 % EX CREA: 1.0000 | TOPICAL_CREAM | CUTANEOUS | Status: DC | PRN

## 2023-03-16 MED ORDER — HEPARIN SODIUM (PORCINE) 1000 UNIT/ML DIALYSIS
2800.0000 [IU] | INTRAMUSCULAR | Status: DC | PRN
  Administered 2023-03-17 (×2): 2800 [IU]
  Filled 2023-03-16 (×3): qty 3
  Filled 2023-03-16: qty 2.8
  Filled 2023-03-16: qty 3

## 2023-03-16 MED ORDER — HEPARIN SODIUM (PORCINE) 1000 UNIT/ML DIALYSIS: 1000.0000 [IU] | INTRAMUSCULAR | Status: DC | PRN

## 2023-03-16 MED ORDER — VANCOMYCIN HCL 750 MG/150ML IV SOLN
750.0000 mg | Freq: Once | INTRAVENOUS | Status: AC
  Administered 2023-03-16: 750 mg via INTRAVENOUS
  Filled 2023-03-16: qty 150

## 2023-03-16 MED ORDER — ANTICOAGULANT SODIUM CITRATE 4% (200MG/5ML) IV SOLN: 5.0000 mL | Status: DC | PRN

## 2023-03-16 MED ORDER — ALTEPLASE 2 MG IJ SOLR: 2.0000 mg | Freq: Once | INTRAMUSCULAR | Status: DC | PRN

## 2023-03-16 NOTE — NC FL2 (Signed)
[place] MEDICAID FL2 LEVEL OF CARE FORM     IDENTIFICATION  Patient Name: [name] [name] Birthdate: 31-Jan-1961 Sex: male Admission Date (Current Location): 03/06/2023  [facility] and [place] Number:  Producer, television/film/video and Address:         Provider Number: [id number]  Attending Physician Name and Address:  Alba Cory, MD  Relative Name and Phone Number:       Current Level of Care: Hospital Recommended Level of Care: Skilled Nursing Facility Prior Approval Number:    Date Approved/Denied:   PASRR Number: [id number]  Discharge Plan: SNF    Current Diagnoses: Patient Active Problem List   Diagnosis Date Noted   Acute metabolic encephalopathy 03/06/2023   Rectal bleeding 01/14/2023   Acute blood loss anemia 01/14/2023   Hypokalemia 01/12/2023   Proctitis 01/12/2023   Hypothyroidism 01/12/2023   GERD (gastroesophageal reflux disease) 01/12/2023   ESRD (end stage renal disease) (HCC) 01/12/2023   GI bleed 01/11/2023   Anemia of chronic renal failure 06/09/2022   Pulmonary hypertension, unspecified (HCC) 07/20/2021   Stage 3 chronic kidney disease (HCC) 11/27/2020   Acute on chronic combined systolic and diastolic CHF (congestive heart failure) (HCC) 09/09/2020   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   Anemia of chronic disease 09/09/2020   Acute respiratory failure with hypoxia (HCC) 09/09/2020   Anasarca 08/28/2020   Nephrotic syndrome 08/28/2020   Dilated cardiomyopathy (HCC) 08/17/2020   Neuropathy 07/12/2018   Osteomyelitis of great toe of right foot (HCC) 07/12/2018   Essential hypertension 02/14/2018   Uncontrolled type 2 diabetes mellitus with hyperglycemia (HCC) 01/08/2018   Hyperlipidemia associated with type 2 diabetes mellitus (HCC) 01/08/2018    Orientation RESPIRATION BLADDER Height & Weight     Self, Place  Normal Incontinent Weight: 136 lb 11 oz (62 kg) Height:  5\' 10"  (177.8 cm)  BEHAVIORAL SYMPTOMS/MOOD  NEUROLOGICAL BOWEL NUTRITION STATUS      Incontinent Diet (please see discharge summary)  AMBULATORY STATUS COMMUNICATION OF NEEDS Skin   Limited Assist Verbally Other (Comment) (Skin tear pretibial proximal, Right & Left)                       Personal Care Assistance Level of Assistance  Bathing, Feeding, Dressing Bathing Assistance: Limited assistance Feeding assistance: Limited assistance Dressing Assistance: Limited assistance     Functional Limitations Info  Sight, Hearing, Speech Sight Info: Impaired (Glasses) Hearing Info: Adequate Speech Info: Adequate    SPECIAL CARE FACTORS FREQUENCY  PT (By licensed PT), OT (By licensed OT)     PT Frequency: 5x per week OT Frequency: 5x per week            Contractures Contractures Info: Not present    Additional Factors Info  Code Status, Allergies Code Status Info: FULL Allergies Info: NKA           Current Medications (03/16/2023):  This is the current hospital active medication list Current Facility-Administered Medications  Medication Dose Route Frequency Provider Last Rate Last Admin   0.9 %  sodium chloride infusion  250 mL Intravenous PRN Rhyne, Samantha J, PA-C       0.9 %  sodium chloride infusion   Intravenous Continuous Cyndi Bender, NP       0.9 %  sodium chloride infusion   Intravenous Continuous Rhyne, Samantha J, PA-C       acetaminophen (TYLENOL) tablet 650 mg  650 mg Oral Q6H PRN Rhyne, Ames Coupe, PA-C  650 mg at 03/11/23 2226   Or   acetaminophen (TYLENOL) suppository 650 mg  650 mg Rectal Q6H PRN Rhyne, Samantha J, PA-C       alteplase (CATHFLO ACTIVASE) injection 2 mg  2 mg Intracatheter Once PRN Penninger, Lillia Abed, PA       amLODipine (NORVASC) tablet 5 mg  5 mg Oral q morning Rhyne, Samantha J, PA-C   5 mg at 03/16/23 1119   anticoagulant sodium citrate solution 5 mL  5 mL Intracatheter PRN Penninger, Lillia Abed, PA       atorvastatin (LIPITOR) tablet 20 mg  20 mg Oral q morning Rhyne, Samantha  J, PA-C   20 mg at 03/16/23 1119   Chlorhexidine Gluconate Cloth 2 % PADS 6 each  6 each Topical Q0600 Rhyne, Ames Coupe, PA-C   6 each at 03/16/23 0600   Chlorhexidine Gluconate Cloth 2 % PADS 6 each  6 each Topical Q0600 Rhyne, Ames Coupe, PA-C   6 each at 03/16/23 0600   Chlorhexidine Gluconate Cloth 2 % PADS 6 each  6 each Topical Q0600 Rhyne, Ames Coupe, PA-C   6 each at 03/16/23 0600   Darbepoetin Alfa (ARANESP) injection 60 mcg  60 mcg Subcutaneous Q Thu-1800 Rexford Maus, RPH       feeding supplement (NEPRO CARB STEADY) liquid 237 mL  237 mL Oral TID BM Rhyne, Samantha J, PA-C   237 mL at 03/16/23 1409   heparin injection 2,800 Units  2,800 Units Intracatheter PRN Osvaldo Shipper, MD       HYDROmorphone (DILAUDID) injection 0.5 mg  0.5 mg Intravenous Q3H PRN Rhyne, Samantha J, PA-C       influenza vac split trivalent PF (FLULAVAL) injection 0.5 mL  0.5 mL Intramuscular Tomorrow-1000 Rhyne, Samantha J, PA-C       insulin aspart (novoLOG) injection 0-9 Units  0-9 Units Subcutaneous TID WC Rhyne, Samantha J, PA-C   1 Units at 03/16/23 1416   levothyroxine (SYNTHROID) tablet 25 mcg  25 mcg Oral QAC breakfast Rhyne, Samantha J, PA-C   25 mcg at 03/16/23 0559   lidocaine (PF) (XYLOCAINE) 1 % injection 5 mL  5 mL Intradermal PRN Rhyne, Samantha J, PA-C       lidocaine (PF) (XYLOCAINE) 1 % injection 5 mL  5 mL Intradermal PRN Penninger, Lillia Abed, PA       lidocaine-prilocaine (EMLA) cream 1 Application  1 Application Topical PRN Rhyne, Samantha J, PA-C       lidocaine-prilocaine (EMLA) cream 1 Application  1 Application Topical PRN Penninger, Lillia Abed, PA       liver oil-zinc oxide (DESITIN) 40 % ointment   Topical Daily Rhyne, Ames Coupe, PA-C   Given at 03/15/23 1155   metoprolol tartrate (LOPRESSOR) tablet 50 mg  50 mg Oral BID Rhyne, Samantha J, PA-C   50 mg at 03/16/23 1119   multivitamin with minerals tablet 1 tablet  1 tablet Oral Daily Rhyne, Ames Coupe, PA-C   1 tablet at 03/16/23 1119    mupirocin ointment (BACTROBAN) 2 %   Nasal BID Dara Lords, PA-C   Given at 03/16/23 1120   ondansetron (ZOFRAN) tablet 4 mg  4 mg Oral Q6H PRN Rhyne, Samantha J, PA-C       Or   ondansetron (ZOFRAN) injection 4 mg  4 mg Intravenous Q6H PRN Rhyne, Samantha J, PA-C       Oral care mouth rinse  15 mL Mouth Rinse PRN Rhyne, Ames Coupe, PA-C  oxyCODONE-acetaminophen (PERCOCET/ROXICET) 5-325 MG per tablet 1 tablet  1 tablet Oral Q4H PRN Dara Lords, PA-C   1 tablet at 03/16/23 1147   pantoprazole (PROTONIX) EC tablet 40 mg  40 mg Oral Daily Rhyne, Samantha J, PA-C   40 mg at 03/16/23 1119   pentafluoroprop-tetrafluoroeth (GEBAUERS) aerosol 1 Application  1 Application Topical PRN Rhyne, Samantha J, PA-C       pentafluoroprop-tetrafluoroeth (GEBAUERS) aerosol 1 Application  1 Application Topical PRN Penninger, Lillia Abed, PA       sodium chloride flush (NS) 0.9 % injection 10-40 mL  10-40 mL Intracatheter Q12H Regalado, Belkys A, MD   10 mL at 03/16/23 1400   sodium chloride flush (NS) 0.9 % injection 10-40 mL  10-40 mL Intracatheter PRN Regalado, Belkys A, MD       sodium chloride flush (NS) 0.9 % injection 3 mL  3 mL Intravenous Q12H Rhyne, Samantha J, PA-C   3 mL at 03/15/23 1155   sodium chloride flush (NS) 0.9 % injection 3 mL  3 mL Intravenous PRN Rhyne, Samantha J, PA-C       vancomycin (VANCOREADY) IVPB 750 mg/150 mL  750 mg Intravenous Q M,W,F-HD Dara Lords, PA-C   Held at 03/14/23 1610     Discharge Medications: Please see discharge summary for a list of discharge medications.  Relevant Imaging Results:  Relevant Lab Results:   Additional Information SSN [id number]  HD @ [facility] MWF arrive 10:40 am  Chair 11:00 am  Eduard Roux, [place]

## 2023-03-16 NOTE — Progress Notes (Signed)
[facility] Progress Note   Subjective:   Patient seen and examined at bedside during dialysis. Tolerating treatment well using temp catheter.  Denies CP, SOB, abdominal pain and n/v/d.  No longer on precautions, can go upstairs for HD from now on.  Plan for TEE tomorrow.   Objective Vitals:   03/16/23 0945 03/16/23 1000 03/16/23 1039 03/16/23 1240  BP: 94/66 (!) 146/72 138/87 (!) 120/55  Pulse: 97 64 73   Resp: 20 20 20    Temp:  (!) 97.3 F (36.3 C)    TempSrc:  Oral Oral   SpO2: 100% 100% 100%   Weight:      Height:       Physical Exam General:chronically ill appearing male in NAD Heart:RRR, no mrg Lungs:clear anteriorly, nml WOB on RA Abdomen:soft, NDNT Extremities:no LE edema, LUE dressed Dialysis Access: temp cath in use   Filed Weights   03/07/23 1600 03/10/23 0945 03/10/23 1330  Weight: 63.1 kg 63.1 kg 62 kg    Intake/Output Summary (Last 24 hours) at 03/16/2023 1311 Last data filed at 03/16/2023 1217 Gross per 24 hour  Intake 650 ml  Output 1625 ml  Net -975 ml    Additional Objective Labs: Basic Metabolic Panel: Recent Labs  Lab 03/13/23 0555 03/15/23 0420 03/16/23 0546  NA 135 137 134*  K 3.0* 3.4* 4.3  CL 101 100 96*  CO2 24 26 20*  GLUCOSE 86 99 149*  BUN 29* 17 27*  CREATININE 5.68* 4.06* 4.87*  CALCIUM 7.0* 7.1* 7.1*  PHOS  --  2.1* 4.4   Liver Function Tests: Recent Labs  Lab 03/14/23 1152 03/15/23 0420 03/16/23 0546  AST 33  --   --   ALT 17  --   --   ALKPHOS 167*  --   --   BILITOT 0.9  --   --   PROT 6.5  --   --   ALBUMIN 2.1* 1.7* 1.8*   CBC: Recent Labs  Lab 03/09/23 1554 03/10/23 1005 03/13/23 0555 03/15/23 0420 03/16/23 0546  WBC 6.6 6.9 5.6 4.9 6.2  HGB 11.8* 10.0* 9.9* 9.0* 11.7*  HCT 37.0* 31.1* 31.2* 28.1* 35.9*  MCV 87.7 86.6 90.2 88.1 89.8  PLT 86* 78* 99* 94* 127*   Blood Culture    Component Value Date/Time   SDES HEMODIALYSIS GRAFT LEFT ARM 03/15/2023 1602   SPECREQUEST NONE  03/15/2023 1602   CULT  03/15/2023 1602    NO GROWTH < 24 HOURS Performed at [facility], [address], [place], [place] [address]    REPTSTATUS PENDING 03/15/2023 2956     Studies/Results: DG Chest Port 1 View  Result Date: 03/15/2023 CLINICAL DATA:  Post dialysis catheter insertion EXAM: PORTABLE CHEST 1 VIEW COMPARISON:  03/06/2023 FINDINGS: Shallow inspiration. A right central venous catheter has been placed with tip projecting over the right atrium. No pneumothorax. Heart size is normal. Lungs are clear. No pleural effusions. No pneumothorax. Skin clips over the left axilla. Degenerative changes in the spine and shoulders. IMPRESSION: Right central venous catheter placed with tip projecting over the right atrium. No pneumothorax. Lungs are clear. Electronically Signed   By: Burman Nieves M.D.   On: 03/15/2023 21:22   PERIPHERAL VASCULAR CATHETERIZATION  Result Date: 03/15/2023 See surgical note for result.  HYBRID OR IMAGING ([facility] ONLY)  Result Date: 03/15/2023 There is no interpretation for this exam.  This order is for images obtained during a surgical procedure.  Please See "Surgeries" Tab for  more information regarding the procedure.    Medications:  sodium chloride     sodium chloride     sodium chloride     anticoagulant sodium citrate     vancomycin Stopped (03/14/23 0944)   vancomycin      amLODipine  5 mg Oral q morning   atorvastatin  20 mg Oral q morning   chlorhexidine  15 mL Mouth/Throat NOW   Chlorhexidine Gluconate Cloth  6 each Topical Q0600   Chlorhexidine Gluconate Cloth  6 each Topical Q0600   Chlorhexidine Gluconate Cloth  6 each Topical Q0600   darbepoetin (ARANESP) injection - DIALYSIS  60 mcg Subcutaneous Q Wed-1800   feeding supplement (NEPRO CARB STEADY)  237 mL Oral TID BM   influenza vac split trivalent PF  0.5 mL Intramuscular Tomorrow-1000   insulin aspart  0-9 Units Subcutaneous TID WC   levothyroxine  25 mcg Oral QAC breakfast    liver oil-zinc oxide   Topical Daily   metoprolol tartrate  50 mg Oral BID   multivitamin with minerals  1 tablet Oral Daily   mupirocin ointment   Nasal BID   pantoprazole  40 mg Oral Daily   sodium chloride flush  10-40 mL Intracatheter Q12H   sodium chloride flush  3 mL Intravenous Q12H    Dialysis Orders: [facility] MWF. 4 hours. EDW 64 kg. LUE AVG 15-gauge. Flow rates: 400/autoflow 1.5. 3K/2.5 calcium. No heparin. Mircera 75 mcg every 2 weeks (last dose 9/11). Venofer 50 mg q. weekly.    Assessment/Plan:   1.  MRSA and E faecalis bacteremia - ID following. On vancomycin. TEE planned for 10/4.  Concern for seeding of AVG, VVS removed yesterday with no obvious infection surround it. Temporary cath placed.  Plan for [facility] +/- new permanent access placement. ESRD- HD MWF. HD tomorrow per regular schedule.  COVID-19 PNA - on remdesivir.  Out of isolation.  Hypokalemia - chronic issue, liberalize diet. Better today.  On potassium at home, monitor closely. Use increased K bath.  Altered mental status - waxing and wanning.  Possible AVG infection- removed yesterday on 10/2 w/temp cath placement. Will need TDC placed prior to discharge once bacteremia resolved. May also place new permanent access per notes.  HTN/volume -BP mostly in goal today.  Hx hypotension.  Under edw, likely weight loss.  UF as tolerated. Plan for lower dry weight on d/c.  Anemia of CKD -Hgb 11.7, hold ESA for now.  Has not been given during admission. Secondary hyperparathyroidism -placed on binders, corrected calcium at goal and phosphorus Nutrition - Carb modified diet w/Fluid restrictions.  DMT2 - per PMD Debility - chronic issue.  Would benefit from SNF placement but previously has refused. Falling at home per caretaker when discussed at outpatient HD. Cared for by 15 year old mother who does her best but can not pick him up when he falls per her own account. Refusing to consider SNF placement.    Virgina Norfolk,  PA-C [facility] 03/16/2023,1:11 PM  LOS: 10 days

## 2023-03-16 NOTE — Progress Notes (Addendum)
   03/16/23 1000  Vitals  Temp (!) 97.3 F (36.3 C)  Temp Source Oral  BP (!) 146/72  MAP (mmHg) 95  BP Location Right Arm  BP Method Automatic  Patient Position (if appropriate) Lying  Pulse Rate 64  Pulse Rate Source Dinamap  ECG Heart Rate 64  Resp 20  Oxygen Therapy  SpO2 100 %  O2 Device Room Air  During Treatment Monitoring  HD Safety Checks Performed Yes  Intra-Hemodialysis Comments Tx completed  Post Treatment  Dialyzer Clearance Lightly streaked  Liters Processed 69.2  Fluid Removed (mL) 1600 mL  Tolerated HD Treatment Yes  Post-Hemodialysis Comments pt stable  Hemodialysis Catheter Right Internal jugular Double lumen Temporary (Non-Tunneled)  Placement Date/Time: 03/15/23 1604   Time Out: Correct patient;Correct site;Correct procedure  Maximum sterile barrier precautions: Hand hygiene;Cap;Mask;Sterile gown;Sterile gloves;Large sterile sheet  Site Prep: Chlorhexidine (preferred);Skin Prep C...  Site Condition No complications  Blue Lumen Status Saline locked  Red Lumen Status Dead end cap in place  Dressing Type Transparent  Dressing Status Clean, Dry, Intact  Drainage Description None  Post treatment catheter status Capped and Clamped   Net UF . Report given to Maree Erie RN

## 2023-03-16 NOTE — Progress Notes (Addendum)
[facility] [place]  Subjective  - Currently on HD in his room, no new complaints   Objective 131/83 64 97.9 F (36.6 C) (Oral) 18 97%  Intake/Output Summary (Last 24 hours) at 03/16/2023 0752 Last data filed at 03/16/2023 0655 Gross per 24 hour  Intake 650 ml  Output 25 ml  Net 625 ml    Left arm dressing clean and dry, will maintain. Palpable radial pulse left UE Right temp catheter is functioning well  Assessment/Planning: POD # 1 Placement of temporary dialysis catheter (20cm RIJ) using ultrasound and fluoroscopic guidance Harvest of left basilic vein Direct repair of left brachial artery with vein patch angioplasty Excision of left arm arteriovenous graft  No frank infection found intraoperatively surrounding the AV graft.  Temp cath functioning well currently on HD.   No leukocytosis, Afebrile,  ID following MRSA/enterococcal bacteremia with plans to continue Vancomycin and TEE on Friday Covid isolation possible discontinued today. Graft sent for culture.  No growth to date.  Pending time of temp cath verse [facility] verse new permanent access.  Dr. Lenell Antu plans on f/u in 2-3 weeks.  [name] 03/16/2023 7:52 AM --  Laboratory Lab Results: Recent Labs    03/15/23 0420 03/16/23 0546  WBC 4.9 6.2  HGB 9.0* 11.7*  HCT 28.1* 35.9*  PLT 94* 127*   BMET Recent Labs    03/15/23 0420 03/16/23 0546  NA 137 134*  K 3.4* 4.3  CL 100 96*  CO2 26 20*  GLUCOSE 99 149*  BUN 17 27*  CREATININE 4.06* 4.87*  CALCIUM 7.1* 7.1*    COAG Lab Results  Component Value Date   INR 1.1 03/06/2023   INR 1.1 08/30/2020   No results found for: "PTT"  VASCULAR STAFF ADDENDUM: I have independently interviewed and examined the patient. I agree with the above.  Graft completely excised yesterday.  Temporary catheter placed for dialysis access. Please let us know when [facility] can be placed.  [name]. Lenell Antu, MD [facility] Phone Number: [phone] 03/16/2023 11:16 AM

## 2023-03-16 NOTE — Progress Notes (Signed)
Nutrition Follow-up  DOCUMENTATION CODES:   Severe malnutrition in context of chronic illness  INTERVENTION:  Nepro TID, MVT   NUTRITION DIAGNOSIS:   Severe Malnutrition related to chronic illness as evidenced by severe muscle depletion, severe fat depletion.    GOAL:   Patient will meet greater than or equal to 90% of their needs    MONITOR:   PO intake, Supplement acceptance, Labs, Weight trends  REASON FOR ASSESSMENT:   Malnutrition Screening Tool    ASSESSMENT:   62 yo male admitted with acute metabolic encephalopathy d/t UTI and COVID PNA. PMH includes ESRD on HD M-W-F, HTN, DM-2, pulmonary HTN, cardiomyopathy, GERD, HLD, anemia of CKD.  Patient stated that he did not know how his appetite was. He said he was ready for lunch. I told him it would be there in just a few. Further stated that he was going home tomorrow and that he was waiting for his mother to come see him.     NUTRITION - FOCUSED PHYSICAL EXAM:  Flowsheet Row Most Recent Value  Orbital Region Moderate depletion  Upper Arm Region Severe depletion  Thoracic and Lumbar Region Moderate depletion  Buccal Region Severe depletion  Temple Region Moderate depletion  Clavicle Bone Region Severe depletion  Clavicle and Acromion Bone Region Severe depletion  Scapular Bone Region Unable to assess  Dorsal Hand Moderate depletion  Patellar Region Severe depletion  Anterior Thigh Region Severe depletion  Posterior Calf Region Severe depletion  Edema (RD Assessment) None  Hair Reviewed  Eyes Reviewed  Mouth Reviewed  Skin Reviewed  Nails Reviewed       Diet Order:   Diet Order             Diet NPO time specified  Diet effective midnight           Diet renal with fluid restriction Fluid restriction: 1200 mL Fluid; Room service appropriate? Yes; Fluid consistency: Thin  Diet effective now                   EDUCATION NEEDS:   Not appropriate for education at this time  Skin:  Skin  Assessment: Reviewed RN Assessment (pretibial skin tear x 2)  Last BM:  9/22  Height:   Ht Readings from Last 1 Encounters:  03/06/23 5\' 10"  (1.778 m)    Weight:   Wt Readings from Last 1 Encounters:  03/10/23 62 kg    Ideal Body Weight:  75.5 kg  BMI:  Body mass index is 19.61 kg/m.  Estimated Nutritional Needs:   Kcal:  1900-2200  Protein:  85-100 gm  Fluid:  1 L + UOP    Jamelle Haring RDN, LDN Clinical Dietitian  RDN pager # available on Amion

## 2023-03-16 NOTE — Progress Notes (Signed)
Physical Therapy Treatment Patient Details Name: [name] [name] MRN: [id number] DOB: [birth year] Today's Date: 03/16/2023   History of Present Illness [name] [name] is a 62 y.o. male who presents to the ED with worsening mental status. He was admitted with acute metabolic encephalopathy secondary to UTI and COVID-pneumonia and also missed hemodialysis sections. He was found to have MRSA and enterococcal bacteremia. He was started on Vancomycin. Patient was transferred to [facility] for fistula removal. 10/2 s/p placement of temporary dialysis catheter (20cm RIJ) using ultrasound and fluoroscopic guidance, harvest of left basilic vein, direct repair of left brachial artery with vein patch angioplasty, excision of left arm arteriovenous graft.  PMH: ESRD on HD MWF, hypertension, type 2 diabetes, pulmonary hypertension, cardiomyopathy, GERD, dyslipidemia, and anemia of CKD.    PT Comments  Pt received in supine, anxious at prospect of mobilizing OOB but agreeable with encouragement. Pt with difficulty following instructions for seated BLE exercises and needs dense multimodal cues due to cognitive impairment, and needing increased assist up to maxA for standing trials at bedside to RW. Pt unable to weight shift in stance and when asked if his mother could assist him at this level, pt admits that she would not be able to and was agreeable to consider SNF placement for short term therapies so he can get stronger before going home, RN/case mgmt notified. Pt perseverating on missing his mother and fear of pain/mobility. Pt continues to benefit from PT services to progress toward functional mobility goals.     If plan is discharge home, recommend the following: A lot of help with bathing/dressing/bathroom;Assistance with cooking/housework;Help with stairs or ramp for entrance;Assist for transportation;Two people to help with walking and/or transfers;Direct supervision/assist for medications management;Direct  supervision/assist for financial management;Supervision due to cognitive status   Can travel by private vehicle     No  Equipment Recommendations  BSC/3in1 (consider hoyer lift pending progress)    Recommendations for Other Services       Precautions / Restrictions Precautions Precautions: Fall Precaution Comments: history of falls, restraints (pulling on lines) Restrictions Weight Bearing Restrictions: No     Mobility  Bed Mobility Overal bed mobility: Needs Assistance Bed Mobility: Sit to Supine, Rolling, Sidelying to Sit Rolling: Min assist Sidelying to sit: Mod assist, HOB elevated, Used rails   Sit to supine: Min assist, Used rails   General bed mobility comments: max cues for initiation and participation, pt perseverating on fear of pain/falls and reluctant, needs multimodal cues and increased time to attempt.    Transfers Overall transfer level: Needs assistance Equipment used: Rolling walker (2 wheels) Transfers: Sit to/from Stand Sit to Stand: Max assist, From elevated surface Stand pivot transfers: Max assist, From elevated surface         General transfer comment: Pt able to stand with maxA and verbal cueing for hand placement and safety, maintains crouched posture x2 attempts. On second stand, maxA for stand/squat pivot toward his R to sit up higher in bed. Pt yelling/moaning in pain with trials (inconsistent c/o where pain is) and once returned to supine, he admits that it was "more anxiety" than pain and pain had only been "moderate" with sitting/standing trials.    Ambulation/Gait             Pre-gait activities: unable to weight shift in stance, unclear if due to pain or poor effort, difficulty for him to stand fully upright.     Stairs  Wheelchair Mobility     Tilt Bed    Modified Rankin (Stroke Patients Only)       Balance Overall balance assessment: Needs assistance Sitting-balance support: Feet supported, Single  extremity supported Sitting balance-Leahy Scale: Poor Sitting balance - Comments: posterior lean, CGA   Standing balance support: Reliant on assistive device for balance, Bilateral upper extremity supported Standing balance-Leahy Scale: Poor Standing balance comment: mod to maxA static standing at RW, posterior lean/crouched, anxious when standing x2 trials maintains <10-15 seconds each                            Cognition Arousal: Alert Behavior During Therapy: Anxious, Impulsive, Lability Overall Cognitive Status: No family/caregiver present to determine baseline cognitive functioning (impaired per chart review; no family present) Area of Impairment: Safety/judgement, Awareness, Memory, Following commands, Problem solving                     Memory: Decreased recall of precautions, Decreased short-term memory (cues not to scratch at/pull on R IJ catheter site inermittently; pt oriented to month but states day before as date) Following Commands: Follows one step commands with increased time, Follows one step commands inconsistently Safety/Judgement: Decreased awareness of safety, Decreased awareness of deficits Awareness: Intellectual Problem Solving: Slow processing, Decreased initiation, Difficulty sequencing, Requires verbal cues, Requires tactile cues General Comments: Apparent cognitive deficit with pt perseverating on fear of pain and mobility/falls. PTA discussed his symptoms in context of plan for after procedures are done and he is medically discharged. Pt states he is anxious really more than pain (pain is moderate, not severe although he was screaming with standing trials) and when asked if he thought his mother would be able to provide maximal lifting assist as PTA had, he states no, and expressed that it would be a good idea to go somewhere to get short term rehab "for a couple weeks" so he can go home with mother where she can help him at home.         Exercises Other Exercises Other Exercises: seated BLE AAROM: hip flexion, LAQ x10 reps ea (pt needs max multimodal cues for proper technique due to cognitive deficit) Other Exercises: supine BLE AROM: ankle pumps x10 reps ea Other Exercises: STS x2 trials for BLE strengthening    General Comments General comments (skin integrity, edema, etc.): No dizziness reported sitting/standing trials, BP taken in supine (HOB ~40*) at end of session and reading 120/55 (76) and HR 70 bpm. SpO2 100% on RA. Pt positioned in upright bed chair posture with pillows for support at end of session      Pertinent Vitals/Pain Pain Assessment Pain Assessment: PAINAD Breathing: normal Negative Vocalization: repeated troubled calling out, loud moaning/groaning, crying Facial Expression: facial grimacing Body Language: tense, distressed pacing, fidgeting Consolability: distracted or reassured by voice/touch PAINAD Score: 6 Pain Location: inconsistent report of pain (L arm, bil knees, back) Pain Descriptors / Indicators: Sore, Guarding, Grimacing, Moaning, Sharp Pain Intervention(s): Monitored during session, Limited activity within patient's tolerance, Repositioned, Patient requesting pain meds-RN notified, Other (comment) (reports improvement once supine but when asked if it was "more anxiety or more pain" causing him to call out, he states "more anxiety" and that pain is "moderate")     PT Goals (current goals can now be found in the care plan section) Acute Rehab PT Goals Patient Stated Goal: To get stronger at rehab so I can go home where my mother  can help me. PT Goal Formulation: With patient/family Time For Goal Achievement: 03/22/23 Progress towards PT goals: Progressing toward goals (slowly; NPO status and pt anxiety appear to be limiting him)    Frequency    Min 1X/week      PT Plan         AM-PAC PT "6 Clicks" Mobility   Outcome Measure  Help needed turning from your back to your side  while in a flat bed without using bedrails?: A Little Help needed moving from lying on your back to sitting on the side of a flat bed without using bedrails?: A Lot Help needed moving to and from a bed to a chair (including a wheelchair)?: Total Help needed standing up from a chair using your arms (e.g., wheelchair or bedside chair)?: A Lot Help needed to walk in hospital room?: Total Help needed climbing 3-5 steps with a railing? : Total 6 Click Score: 10    End of Session Equipment Utilized During Treatment: Gait belt Activity Tolerance: Patient limited by fatigue;Patient limited by pain Patient left: in bed;with call bell/phone within reach;with bed alarm set;with restraints reapplied (L mitt, bil wrist restraints re-donned, bed in chair posture) Nurse Communication: Mobility status;Other (comment);Patient requests pain meds (pt requesting if he can eat, order seen for NPO at midnight and he was also NPO yesterday; pt states he is now agreeable to SNF) PT Visit Diagnosis: Unsteadiness on feet (R26.81);Other abnormalities of gait and mobility (R26.89);Muscle weakness (generalized) (M62.81)     Time: 1220-1240 PT Time Calculation (min) (ACUTE ONLY): 20 min  Charges:    $Therapeutic Exercise: 8-22 mins PT General Charges $$ ACUTE PT VISIT: 1 Visit                     Jaydeen Odor P., PTA Acute Rehabilitation Services Secure Chat Preferred 9a-5:30pm Office: [phone]    Angus Palms 03/16/2023, 1:31 PM

## 2023-03-16 NOTE — Progress Notes (Signed)
[facility]    Date of Admission:  03/06/2023    Total days of antibiotics 11 Vancomycin         ID: [name] [name] is a 62 y.o. male with MRSA/enterococcal bacteremia  Principal Problem:   Acute metabolic encephalopathy Active Problems:   Essential hypertension   Neuropathy   Anemia of chronic disease   Hypokalemia   Hypothyroidism   GERD (gastroesophageal reflux disease)   ESRD (end stage renal disease) (HCC)    Subjective: Pt is doing well. He reports some pain in his L arm secondary to yesterday's procedures. He was on the phone with his mother during much of our conversation. He had soft restraints on as he had attempted to remove his R internal jugular dialysis catheter.  Medications:   amLODipine  5 mg Oral q morning   atorvastatin  20 mg Oral q morning   chlorhexidine  15 mL Mouth/Throat NOW   Chlorhexidine Gluconate Cloth  6 each Topical Q0600   Chlorhexidine Gluconate Cloth  6 each Topical Q0600   Chlorhexidine Gluconate Cloth  6 each Topical Q0600   darbepoetin (ARANESP) injection - DIALYSIS  60 mcg Subcutaneous Q Wed-1800   feeding supplement (NEPRO CARB STEADY)  237 mL Oral TID BM   influenza vac split trivalent PF  0.5 mL Intramuscular Tomorrow-1000   insulin aspart  0-9 Units Subcutaneous TID WC   levothyroxine  25 mcg Oral QAC breakfast   liver oil-zinc oxide   Topical Daily   metoprolol tartrate  50 mg Oral BID   multivitamin with minerals  1 tablet Oral Daily   mupirocin ointment   Nasal BID   pantoprazole  40 mg Oral Daily   sodium chloride flush  10-40 mL Intracatheter Q12H   sodium chloride flush  3 mL Intravenous Q12H    Objective: Vital signs in last 24 hours: Temp:  [97 F (36.1 C)-98.4 F (36.9 C)] 97.3 F (36.3 C) (10/03 1000) Pulse Rate:  [60-97] 73 (10/03 1039) Resp:  [10-20] 20 (10/03 1039) BP: (92-173)/(55-92) 120/55 (10/03 1240) SpO2:  [93 %-100 %] 100 % (10/03 1039)   General appearance: alert,  cooperative, and no distress Cardio: regular rate and rhythm, S1, S2 normal, no murmur, click, rub or gallop GI: soft, non-tender; bowel sounds normal; no masses,  no organomegaly Extremities: extremities normal, atraumatic, no cyanosis or edema Pulses: 2+ and symmetric Skin: Skin color, texture, turgor normal. No rashes or lesions  Lab Results Recent Labs    03/15/23 0420 03/16/23 0546  WBC 4.9 6.2  HGB 9.0* 11.7*  HCT 28.1* 35.9*  NA 137 134*  K 3.4* 4.3  CL 100 96*  CO2 26 20*  BUN 17 27*  CREATININE 4.06* 4.87*   Liver Panel Recent Labs    03/14/23 1152 03/15/23 0420 03/16/23 0546  PROT 6.5  --   --   ALBUMIN 2.1* 1.7* 1.8*  AST 33  --   --   ALT 17  --   --   ALKPHOS 167*  --   --   BILITOT 0.9  --   --   BILIDIR 0.3*  --   --   IBILI 0.6  --   --    Sedimentation Rate No results for input(s): "ESRSEDRATE" in the last 72 hours. C-Reactive Protein No results for input(s): "CRP" in the last 72 hours.  Microbiology:  Studies/Results: DG Chest Port 1 View  Result Date: 03/15/2023 CLINICAL DATA:  Post dialysis catheter  insertion EXAM: PORTABLE CHEST 1 VIEW COMPARISON:  03/06/2023 FINDINGS: Shallow inspiration. A right central venous catheter has been placed with tip projecting over the right atrium. No pneumothorax. Heart size is normal. Lungs are clear. No pleural effusions. No pneumothorax. Skin clips over the left axilla. Degenerative changes in the spine and shoulders. IMPRESSION: Right central venous catheter placed with tip projecting over the right atrium. No pneumothorax. Lungs are clear. Electronically Signed   By: Burman Nieves M.D.   On: 03/15/2023 21:22   PERIPHERAL VASCULAR CATHETERIZATION  Result Date: 03/15/2023 See surgical note for result.  HYBRID OR IMAGING ([facility] ONLY)  Result Date: 03/15/2023 There is no interpretation for this exam.  This order is for images obtained during a surgical procedure.  Please See "Surgeries" Tab for more  information regarding the procedure.     Assessment/Plan: This patient has MRSA/enterococcal bacteremia and endocarditis and currently treated with vancomycin with HD. He has a history of ESRD on MWF HD with associated anemia, HTN, T2DM, pulmonary HTN, cardiomyopathy. He was admitted with acute metabolic enceophalopathy in setting of Covid and missed hemodialysis.   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 He will be off restrictions tomorrow and safe for TEE. Previously: concern for endocarditis per TTE on 9/27: new aortic valve mobile density consistent with endocarditis.  - Removal of infected LUE graft via Dr. Lenell Antu yesterday. Uneventful. Graft easily removed suggesting impact from infection per op note, but no frank infection.  - Op cultures without growth < 24 hours   Covid-19 infection  Symptoms resolved. Received Remdesivir course. 10 day isolation, today is day 10. Off tomorrow and safe for TEE.   ESRD Continue dialysis on MWF and dose vancomycin towards end of session.   [name], [facility] for Infectious Diseases Internal Medicine Resident - PGY1 Pager: [phone]  03/16/2023, 1:13 PM

## 2023-03-16 NOTE — Progress Notes (Addendum)
PROGRESS NOTE    [name] [name]  ZOX:[id number] DOB: August 12, 1960 DOA: 03/06/2023 PCP: Farris Has, MD   Brief Narrative: 62 year old with past medical history significant for ESRD on hemodialysis MWF, hypertension, diabetes type 2, pulmonary hypertension, cardiomyopathy, GERD, dyslipidemia, anemia of CKD presents to the ED with worsening mental status.  [name] [name] was admitted with acute metabolic encephalopathy secondary to UTI and COVID-pneumonia and also missed hemodialysis sections.  [name] [name] was found to have MRSA and enterococcal bacteremia. [name] [name] was started on Vancomycin.  ID has been following.  Patient was transferred to [facility] for fistula removal.  Vascular surgery was notified.     Assessment & Plan:   Principal Problem:   Acute metabolic encephalopathy Active Problems:   Essential hypertension   Neuropathy   Anemia of chronic disease   Hypokalemia   Hypothyroidism   GERD (gastroesophageal reflux disease)   ESRD (end stage renal disease) (HCC)  1-MRSA and Enterococcus bacteremia -No significant findings on ultrasound of fistula or CT maxillofacial  -ID following -Concern  for infection in the AV graft.  Plan for excision of graft today 10/2 -Echocardiogram raise concern for endocarditis -TEE is being pursued -Due to COVID cardiology cannot do this procedure until later this week.  Tentatively scheduled 10/4   COVID infection Completed course of Remdesivir Completed steroid.  Continue isolation.  [name] [name] tested +9/23.   Off isolation today.    ESRD on hemodialysis MWF Managed per nephrology  Acute metabolic encephalopathy In the setting of UTI/COVID, missed hemodialysis and bacteremia Likely has some cognitive impairment at baseline [name] [name] is more confuse today, trying to remove HD catheter today , place on wrist restrain.   Anemia of chronic kidney disease Stable  Hypertension Continue with metoprolol and amlodipine.   Diabetes type 2: Continue with sliding scale  insulin  Hypothyroidism Continue with Synthroid  GERD: Continue  with PPI   Thrombocytopenia.  Improving.    Hypoalbuminemia: On supplements       Nutrition Problem: Increased nutrient needs Etiology: chronic illness (ESRD on HD)    Signs/Symptoms: estimated needs    Interventions: Nepro shake, MVI  Estimated body mass index is 19.61 kg/m as calculated from the following:   Height as of this encounter: 5\' 10"  (1.778 m).   Weight as of this encounter: 62 kg.   DVT prophylaxis: SCDs Code Status: Full code Family Communication: Mother updated over phone 10/03 Disposition Plan:  Status is: Inpatient Remains inpatient appropriate because: Management of COVID, bacteremia.     Consultants:  Nephrology ID Vascular-Dr. Lenell Antu Procedures:    Antimicrobials:    Subjective: [name] [name] is more confuse today.  Denies pain.   Objective: Vitals:   03/16/23 0945 03/16/23 1000 03/16/23 1039 03/16/23 1240  BP: 94/66 (!) 146/72 138/87 (!) 120/55  Pulse: 97 64 73   Resp: 20 20 20    Temp:  (!) 97.3 F (36.3 C)    TempSrc:  Oral Oral   SpO2: 100% 100% 100%   Weight:      Height:        Intake/Output Summary (Last 24 hours) at 03/16/2023 1315 Last data filed at 03/16/2023 1217 Gross per 24 hour  Intake 650 ml  Output 1625 ml  Net -975 ml   Filed Weights   03/07/23 1600 03/10/23 0945 03/10/23 1330  Weight: 63.1 kg 63.1 kg 62 kg    Examination:  General exam: NAD Respiratory system: CTA Cardiovascular system: S 1, S 2 RRR Gastrointestinal system: BS present, soft nt Central nervous system:alert  Data Reviewed: I have personally reviewed following labs and imaging studies  CBC: Recent Labs  Lab 03/09/23 1554 03/10/23 1005 03/13/23 0555 03/15/23 0420 03/16/23 0546  WBC 6.6 6.9 5.6 4.9 6.2  HGB 11.8* 10.0* 9.9* 9.0* 11.7*  HCT 37.0* 31.1* 31.2* 28.1* 35.9*  MCV 87.7 86.6 90.2 88.1 89.8  PLT 86* 78* 99* 94* 127*   Basic Metabolic Panel: Recent  Labs  Lab 03/09/23 1554 03/10/23 1005 03/11/23 1112 03/13/23 0555 03/15/23 0420 03/16/23 0546  NA 135 134* 134* 135 137 134*  K 3.2* 2.8* 2.9* 3.0* 3.4* 4.3  CL 94* 100 96* 101 100 96*  CO2 26 27 27 24 26  20*  GLUCOSE 148* 113* 140* 86 99 149*  BUN 24* 14 20 29* 17 27*  CREATININE 5.28* 3.13* 4.56* 5.68* 4.06* 4.87*  CALCIUM 7.1* 6.8* 7.0* 7.0* 7.1* 7.1*  MG 1.7 1.5*  --   --   --   --   PHOS  --   --   --   --  2.1* 4.4   GFR: Estimated Creatinine Clearance: 13.4 mL/min (A) (by C-G formula based on SCr of 4.87 mg/dL (H)). Liver Function Tests: Recent Labs  Lab 03/14/23 1152 03/15/23 0420 03/16/23 0546  AST 33  --   --   ALT 17  --   --   ALKPHOS 167*  --   --   BILITOT 0.9  --   --   PROT 6.5  --   --   ALBUMIN 2.1* 1.7* 1.8*   No results for input(s): "LIPASE", "AMYLASE" in the last 168 hours. No results for input(s): "AMMONIA" in the last 168 hours. Coagulation Profile: No results for input(s): "INR", "PROTIME" in the last 168 hours. Cardiac Enzymes: No results for input(s): "CKTOTAL", "CKMB", "CKMBINDEX", "TROPONINI" in the last 168 hours. BNP (last 3 results) No results for input(s): "PROBNP" in the last 8760 hours. HbA1C: No results for input(s): "HGBA1C" in the last 72 hours. CBG: Recent Labs  Lab 03/15/23 1120 03/15/23 1334 03/15/23 1639 03/15/23 2106 03/16/23 1022  GLUCAP 91 89 99 137* 103*   Lipid Profile: No results for input(s): "CHOL", "HDL", "LDLCALC", "TRIG", "CHOLHDL", "LDLDIRECT" in the last 72 hours. Thyroid Function Tests: No results for input(s): "TSH", "T4TOTAL", "FREET4", "T3FREE", "THYROIDAB" in the last 72 hours. Anemia Panel: No results for input(s): "VITAMINB12", "FOLATE", "FERRITIN", "TIBC", "IRON", "RETICCTPCT" in the last 72 hours. Sepsis Labs: No results for input(s): "PROCALCITON", "LATICACIDVEN" in the last 168 hours.  Recent Results (from the past 240 hour(s))  MRSA Next Gen by PCR, Nasal     Status: Abnormal    Collection Time: 03/06/23  6:44 PM   Specimen: Nasal Mucosa; Nasal Swab  Result Value Ref Range Status   MRSA by PCR Next Gen DETECTED (A) NOT DETECTED Final    Comment: RESULT CALLED TO, READ BACK BY AND VERIFIED WITH: [facility] BE[facility]ER 2104 03/06/23 BY VIRAY,J (NOTE) The GeneXpert MRSA Assay (FDA approved for NASAL specimens only), is one component of a comprehensive MRSA colonization surveillance program. It is not intended to diagnose MRSA infection nor to guide or monitor treatment for MRSA infections. Test performance is not FDA approved in patients less than 35 years old. Performed at [facility], [address], [place], [place] [address]   Culture, blood (Routine X 2) w Reflex to ID Panel     Status: Abnormal   Collection Time: 03/07/23 12:08 PM   Specimen: BLOOD  Result Value Ref Range Status   Specimen  Description   Final    BLOOD ARTERIAL DRAW Performed at [facility], [address], [place], [place] [address]    Special Requests   Final    BOTTLES DRAWN AEROBIC AND ANAEROBIC Blood Culture results may not be optimal due to an excessive volume of blood received in culture bottles Performed at [facility], [address] [place], [place] [address]    Culture  Setup Time   Final    GRAM POSITIVE COCCI IN BOTH AEROBIC AND ANAEROBIC BOTTLES CRITICAL RESULT CALLED TO, READ BACK BY AND VERIFIED WITH: K.THOMAS ON 03/08/2023 @14 :54 BY FRATTO,A  CRITICAL VALUE NOTED.  VALUE IS CONSISTENT WITH PREVIOUSLY REPORTED AND CALLED VALUE.    Culture (A)  Final    STAPHYLOCOCCUS AUREUS SUSCEPTIBILITIES PERFORMED ON PREVIOUS CULTURE WITHIN THE LAST 5 DAYS. Performed at [facility], [address] [place], [place] [address]    Report Status 03/10/2023 FINAL  Final  Culture, blood (Routine X 2) w Reflex to ID Panel     Status: Abnormal   Collection Time: 03/07/23 12:24 PM   Specimen: BLOOD  Result Value Ref Range Status   Specimen Description   Final    BLOOD ARTERIAL  DRAW Performed at [facility], [address] [place], [place] [address]    Special Requests   Final    BOTTLES DRAWN AEROBIC AND ANAEROBIC Blood Culture results may not be optimal due to an excessive volume of blood received in culture bottles Performed at [facility], [address] [place], [place] [address]    Culture  Setup Time   Final    GRAM POSITIVE COCCI IN BOTH AEROBIC AND ANAEROBIC BOTTLES CRITICAL RESULT CALLED TO, READ BACK BY AND VERIFIED WITH: B.DILDI ON 03/08/2023 @12 :27 BY T.HAMER CRITICAL VALUE NOTED.  VALUE IS CONSISTENT WITH PREVIOUSLY REPORTED AND CALLED VALUE.    Culture (A)  Final    STAPHYLOCOCCUS AUREUS SUSCEPTIBILITIES PERFORMED ON PREVIOUS CULTURE WITHIN THE LAST 5 DAYS. Performed at [facility], [address], [place], [place] [address]    Report Status 03/10/2023 FINAL  Final  Culture, blood (Routine X 2) w Reflex to ID Panel     Status: None   Collection Time: 03/09/23  3:54 PM   Specimen: BLOOD RIGHT ARM  Result Value Ref Range Status   Specimen Description BLOOD RIGHT ARM  Final   Special Requests   Final    BOTTLES DRAWN AEROBIC ONLY Blood Culture results may not be optimal due to an excessive volume of blood received in culture bottles   Culture   Final    NO GROWTH 5 DAYS Performed at [facility], [address], [place], [place] [address]    Report Status 03/14/2023 FINAL  Final  Culture, blood (Routine X 2) w Reflex to ID Panel     Status: None   Collection Time: 03/09/23  3:54 PM   Specimen: BLOOD RIGHT ARM  Result Value Ref Range Status   Specimen Description BLOOD RIGHT ARM  Final   Special Requests   Final    BOTTLES DRAWN AEROBIC AND ANAEROBIC Blood Culture adequate volume   Culture   Final    NO GROWTH 5 DAYS Performed at [facility], [address], [place], [place] [address]    Report Status 03/14/2023 FINAL  Final  Aerobic/Anaerobic Culture w Gram Stain (surgical/deep wound)     Status: None (Preliminary  result)   Collection Time: 03/15/23  4:02 PM  Specimen: Hemodialysis Graft; Blood  Result Value Ref Range Status   Specimen Description HEMODIALYSIS GRAFT LEFT ARM  Final   Special Requests NONE  Final   Gram Stain   Final    RARE WBC PRESENT, PREDOMINANTLY PMN NO ORGANISMS SEEN    Culture   Final    NO GROWTH < 24 HOURS Performed at [facility], [address], [place], [place] [address]    Report Status PENDING  Incomplete         Radiology Studies: DG Chest Port 1 View  Result Date: 03/15/2023 CLINICAL DATA:  Post dialysis catheter insertion EXAM: PORTABLE CHEST 1 VIEW COMPARISON:  03/06/2023 FINDINGS: Shallow inspiration. A right central venous catheter has been placed with tip projecting over the right atrium. No pneumothorax. Heart size is normal. Lungs are clear. No pleural effusions. No pneumothorax. Skin clips over the left axilla. Degenerative changes in the spine and shoulders. IMPRESSION: Right central venous catheter placed with tip projecting over the right atrium. No pneumothorax. Lungs are clear. Electronically Signed   By: Burman Nieves M.D.   On: 03/15/2023 21:22   PERIPHERAL VASCULAR CATHETERIZATION  Result Date: 03/15/2023 See surgical note for result.  HYBRID OR IMAGING ([facility] ONLY)  Result Date: 03/15/2023 There is no interpretation for this exam.  This order is for images obtained during a surgical procedure.  Please See "Surgeries" Tab for more information regarding the procedure.        Scheduled Meds:  amLODipine  5 mg Oral q morning   atorvastatin  20 mg Oral q morning   chlorhexidine  15 mL Mouth/Throat NOW   Chlorhexidine Gluconate Cloth  6 each Topical Q0600   Chlorhexidine Gluconate Cloth  6 each Topical Q0600   Chlorhexidine Gluconate Cloth  6 each Topical Q0600   darbepoetin (ARANESP) injection - DIALYSIS  60 mcg Subcutaneous Q Wed-1800   feeding supplement (NEPRO CARB STEADY)  237 mL Oral TID BM   influenza vac split trivalent  PF  0.5 mL Intramuscular Tomorrow-1000   insulin aspart  0-9 Units Subcutaneous TID WC   levothyroxine  25 mcg Oral QAC breakfast   liver oil-zinc oxide   Topical Daily   metoprolol tartrate  50 mg Oral BID   multivitamin with minerals  1 tablet Oral Daily   mupirocin ointment   Nasal BID   pantoprazole  40 mg Oral Daily   sodium chloride flush  10-40 mL Intracatheter Q12H   sodium chloride flush  3 mL Intravenous Q12H   Continuous Infusions:  sodium chloride     sodium chloride     sodium chloride     anticoagulant sodium citrate     vancomycin Stopped (03/14/23 0944)   vancomycin       LOS: 10 days    Time spent: 35 minutes    Malley Hauter A Sedale Jenifer, MD [facility]   If 7PM-7AM, please contact night-coverage [email or web address]  03/16/2023, 1:15 PM

## 2023-03-16 NOTE — TOC Progression Note (Signed)
Transition of Care [facility]) - Progression Note    Patient Details  Name: [name] [name] MRN: [id number] Date of Birth: 04-15-1961  [facility]) CM/SW Contact  Tom-Johnson, Hershal Coria, RN Phone Number: 03/16/2023, 1:06 PM  Clinical Narrative:     CM informed by PT that patient is now agreeing to go to SNF. CM called and informed patient's mother, [name] [name] who endorsed SNF. LCSW notified via Secure Chat.   CM will continue to follow.      Expected Discharge Plan: Skilled Nursing Facility Barriers to Discharge: Continued Medical Work up  Expected Discharge Plan and Services In-house Referral: Clinical Social Work     Living arrangements for the past 2 months: Single Family Home                                       Social Determinants of Health (SDOH) Interventions SDOH Screenings   Food Insecurity: No Food Insecurity (03/06/2023)  Housing: Low Risk  (03/06/2023)  Transportation Needs: No Transportation Needs (03/06/2023)  Utilities: Not At Risk (03/06/2023)  Depression (PHQ2-9): Low Risk  (09/18/2018)  Tobacco Use: Low Risk  (03/15/2023)    Readmission Risk Interventions    03/07/2023    1:31 PM  Readmission Risk Prevention Plan  Transportation Screening Complete  HRI or Home Care Consult Complete  Palliative Care Screening Not Applicable  Medication Review (RN Care Manager) Complete

## 2023-03-17 ENCOUNTER — Other Ambulatory Visit (HOSPITAL_COMMUNITY): Payer: Medicare HMO

## 2023-03-17 DIAGNOSIS — G9341 Metabolic encephalopathy: Secondary | ICD-10-CM | POA: Diagnosis not present

## 2023-03-17 LAB — GLUCOSE, CAPILLARY
Glucose-Capillary: 77 mg/dL (ref 70–99)
Glucose-Capillary: 86 mg/dL (ref 70–99)
Glucose-Capillary: 73 mg/dL (ref 70–99)
Glucose-Capillary: 103 mg/dL — ABNORMAL HIGH (ref 70–99)

## 2023-03-17 MED ORDER — HALOPERIDOL LACTATE 5 MG/ML IJ SOLN
5.0000 mg | Freq: Four times a day (QID) | INTRAMUSCULAR | Status: DC | PRN
  Filled 2023-03-17: qty 1

## 2023-03-17 NOTE — Progress Notes (Signed)
TR[facility] night cross cover note:  I was notified by RN that this patient, whose hospital course has been complicated by acute metabolic encephalopathy, is agitated, yelling out,  with these behaviors refractory to attempts at verbal redirection.  In the setting of associated interference with ongoing medical treatment posing potential harm to themself, I have placed order for as needed IV Haldol for agitation.     [name] Pigg, DO Hospitalist

## 2023-03-17 NOTE — Progress Notes (Signed)
[facility]    Date of Admission:  03/06/2023    Total days of antibiotics 12 Vancomycin         ID: [name] [name] is a 62 y.o. male with MRSA/enterococcal bacteremia  Principal Problem:   Acute metabolic encephalopathy Active Problems:   Essential hypertension   Neuropathy   Anemia of chronic disease   Hypokalemia   Hypothyroidism   GERD (gastroesophageal reflux disease)   ESRD (end stage renal disease) (HCC)   Protein-calorie malnutrition, severe    Subjective: Pt doing well, anxious to go home. No fevers, chills, pain.  Medications:   amLODipine  5 mg Oral q morning   atorvastatin  20 mg Oral q morning   Chlorhexidine Gluconate Cloth  6 each Topical Q0600   Chlorhexidine Gluconate Cloth  6 each Topical Q0600   Chlorhexidine Gluconate Cloth  6 each Topical Q0600   darbepoetin (ARANESP) injection - DIALYSIS  60 mcg Subcutaneous Q Thu-1800   feeding supplement (NEPRO CARB STEADY)  237 mL Oral TID BM   influenza vac split trivalent PF  0.5 mL Intramuscular Tomorrow-1000   insulin aspart  0-9 Units Subcutaneous TID WC   levothyroxine  25 mcg Oral QAC breakfast   liver oil-zinc oxide   Topical Daily   metoprolol tartrate  50 mg Oral BID   multivitamin with minerals  1 tablet Oral Daily   mupirocin ointment   Nasal BID   pantoprazole  40 mg Oral Daily   sodium chloride flush  10-40 mL Intracatheter Q12H   sodium chloride flush  3 mL Intravenous Q12H    Objective: Vital signs in last 24 hours: Temp:  [97.7 F (36.5 C)-97.8 F (36.6 C)] 97.7 F (36.5 C) (10/04 0830) Pulse Rate:  [60-73] 60 (10/04 0830) Resp:  [16-20] 17 (10/04 0830) BP: (120-178)/(55-98) 177/84 (10/04 0830) SpO2:  [100 %] 100 % (10/04 0830)   General appearance: alert, cooperative, and no distress Cardio: regular rate and rhythm, S1, S2 normal, no murmur, click, rub or gallop GI: soft, non-tender; bowel sounds normal; no masses,  no organomegaly Extremities:  extremities normal, atraumatic, no cyanosis or edema Skin: Skin color, texture, turgor normal. No rashes or lesions  Lab Results Recent Labs    03/15/23 0420 03/16/23 0546  WBC 4.9 6.2  HGB 9.0* 11.7*  HCT 28.1* 35.9*  NA 137 134*  K 3.4* 4.3  CL 100 96*  CO2 26 20*  BUN 17 27*  CREATININE 4.06* 4.87*   Liver Panel Recent Labs    03/14/23 1152 03/15/23 0420 03/16/23 0546  PROT 6.5  --   --   ALBUMIN 2.1* 1.7* 1.8*  AST 33  --   --   ALT 17  --   --   ALKPHOS 167*  --   --   BILITOT 0.9  --   --   BILIDIR 0.3*  --   --   IBILI 0.6  --   --    Sedimentation Rate No results for input(s): "ESRSEDRATE" in the last 72 hours. C-Reactive Protein No results for input(s): "CRP" in the last 72 hours.  Microbiology:  Studies/Results: DG Chest Port 1 View  Result Date: 03/15/2023 CLINICAL DATA:  Post dialysis catheter insertion EXAM: PORTABLE CHEST 1 VIEW COMPARISON:  03/06/2023 FINDINGS: Shallow inspiration. A right central venous catheter has been placed with tip projecting over the right atrium. No pneumothorax. Heart size is normal. Lungs are clear. No pleural effusions. No pneumothorax. Skin clips  over the left axilla. Degenerative changes in the spine and shoulders. IMPRESSION: Right central venous catheter placed with tip projecting over the right atrium. No pneumothorax. Lungs are clear. Electronically Signed   By: Burman Nieves M.D.   On: 03/15/2023 21:22   PERIPHERAL VASCULAR CATHETERIZATION  Result Date: 03/15/2023 See surgical note for result.  HYBRID OR IMAGING ([facility] ONLY)  Result Date: 03/15/2023 There is no interpretation for this exam.  This order is for images obtained during a surgical procedure.  Please See "Surgeries" Tab for more information regarding the procedure.     Assessment/Plan: This patient has MRSA/enterococcal bacteremia and endocarditis and currently treated with vancomycin with HD. He has a history of ESRD on MWF HD with associated anemia,  HTN, T2DM, pulmonary HTN, cardiomyopathy. He was admitted with acute metabolic enceophalopathy in setting of Covid and missed hemodialysis.   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 Received Remdesivir course. No more isolation. Safe for TEE.   ESRD Continue dialysis on MWF and dose vancomycin towards end of session.  [name], [facility] Internal Medicine Resident - PGY1 Pager: [phone]  03/17/2023, 10:29 AM

## 2023-03-17 NOTE — Progress Notes (Signed)
Physical Therapy Treatment Patient Details Name: [name] [name] MRN: [id number] DOB: Jan 30, 1961 Today's Date: 03/17/2023   History of Present Illness [name] [name] is a 62 y.o. male who presents to the ED with worsening mental status. He was admitted with acute metabolic encephalopathy secondary to UTI and COVID-pneumonia and also missed hemodialysis sections. He was found to have MRSA and enterococcal bacteremia. He was started on Vancomycin. Patient was transferred to [facility] for fistula removal. 10/2 s/p placement of temporary dialysis catheter (20cm RIJ) using ultrasound and fluoroscopic guidance, harvest of left basilic vein, direct repair of left brachial artery with vein patch angioplasty, excision of left arm arteriovenous graft.  PMH: ESRD on HD MWF, hypertension, type 2 diabetes, pulmonary hypertension, cardiomyopathy, GERD, dyslipidemia, and anemia of CKD.    PT Comments  Pt received in supine, agreeable to therapy session with encouragement, pt anxious and asking to speak with his mother. Pt needing mod safety/sequencing cues to initiate and perform all tasks and needing slightly decreased assist, up to modA +1 for sit<>stand transfers and sidesteps along EOB with RW support to simulate step pivot transfer. Pt bed placed in chair posture as per chart review pt likely to leave soon for TEE procedure. Pt able to call his mother with fair static seated balance at EOB with 0-1 UE support, needing BUE support (RW) for standing trial. Pt tolerated EOB activity >10 mins this date. Pt continues to benefit from PT services to progress toward functional mobility goals.    If plan is discharge home, recommend the following: A lot of help with bathing/dressing/bathroom;Assistance with cooking/housework;Help with stairs or ramp for entrance;Assist for transportation;Two people to help with walking and/or transfers;Direct supervision/assist for medications management;Direct supervision/assist for financial  management;Supervision due to cognitive status   Can travel by private vehicle     No  Equipment Recommendations  BSC/3in1;Other (comment) (consider hoyer lift pending progress)    Recommendations for Other Services       Precautions / Restrictions Precautions Precautions: Fall Precaution Comments: history of falls, restraints (pt pulling on lines), Contact precs Restrictions Weight Bearing Restrictions: No     Mobility  Bed Mobility Overal bed mobility: Needs Assistance Bed Mobility: Sit to Supine, Rolling, Sidelying to Sit Rolling: Min assist Sidelying to sit: HOB elevated, Used rails, Min assist   Sit to supine: Used rails, Contact guard assist   General bed mobility comments: max cues for initiation and participation, pt perseverating on fear of pain/falls and reluctant, needs multimodal cues and increased time to attempt.    Transfers Overall transfer level: Needs assistance Equipment used: Rolling walker (2 wheels) Transfers: Sit to/from Stand, Bed to chair/wheelchair/BSC Sit to Stand: Mod assist, From elevated surface   Step pivot transfers: Mod assist, From elevated surface       General transfer comment: elevated bed<>RW, mod cues for safe UE placement and lift/stabilizing assist needed; pt with limited standing tolerance due to c/o pain/fatigue but does perform 2-3 sidesteps along EOB toward HOB prior to returning to sit.    Ambulation/Gait               General Gait Details: pt defers due to fatigue/pain and NPO status today   Stairs             Wheelchair Mobility     Tilt Bed    Modified Rankin (Stroke Patients Only)       Balance Overall balance assessment: Needs assistance Sitting-balance support: Feet supported, Single extremity supported Sitting balance-Leahy Scale: Poor  Sitting balance - Comments: posterior lean, CGA   Standing balance support: Reliant on assistive device for balance, Bilateral upper extremity  supported Standing balance-Leahy Scale: Poor Standing balance comment: modA dynamic standing at [employer] Arousal: Alert Behavior During Therapy: Anxious, Impulsive Overall Cognitive Status: No family/caregiver present to determine baseline cognitive functioning (impaired per chart review; no family present) Area of Impairment: Safety/judgement, Awareness, Memory, Following commands, Problem solving                     Memory: Decreased recall of precautions, Decreased short-term memory Following Commands: Follows one step commands with increased time Safety/Judgement: Decreased awareness of safety, Decreased awareness of deficits Awareness: Intellectual Problem Solving: Difficulty sequencing, Requires verbal cues, Requires tactile cues General Comments: Apparent cognitive deficit with pt perseverating on fear of pain and missing his mother. Pt assisted to dial mother on telephone while sitting EOB and remains cooperative with encouragement. When given number to dial and specific instructions, pt unable to dial his mother on touchtone phone. Decreased recall of plan for procedure today during session although MD told him the plan ~8 mins prior, at beginning of session.        Exercises Other Exercises Other Exercises: seated BLE AAROM: hip flexion, LAQ x10 reps ea (pt needs max multimodal cues for proper technique due to cognitive deficit) Other Exercises: supine BLE AROM: ankle pumps x10 reps ea Other Exercises: supine BLE A/AAROM: heel slides, hip abduction (AA) x10 reps ea    General Comments General comments (skin integrity, edema, etc.): on R wrist under restraint, some bruising on anterior aspect of wrist; RN notified and called to room to observe. PTA made sure 1-2 fingers fitting under wrist restraint when re-donning so it is not too tight.      Pertinent Vitals/Pain Pain Assessment Pain Assessment: Faces Faces Pain Scale:  Hurts little more Breathing: normal Negative Vocalization: occasional moan/groan, low speech, negative/disapproving quality Facial Expression: sad, frightened, frown Body Language: relaxed Consolability: distracted or reassured by voice/touch PAINAD Score: 3 Pain Location: inconsistent report of pain but mostly L arm Pain Descriptors / Indicators: Guarding, Grimacing, Moaning Pain Intervention(s): Limited activity within patient's tolerance, Monitored during session, Repositioned     PT Goals (current goals can now be found in the care plan section) Acute Rehab PT Goals Patient Stated Goal: To get stronger at rehab so I can go home where my mother can help me. PT Goal Formulation: With patient/family Time For Goal Achievement: 03/22/23 Progress towards PT goals: Progressing toward goals (slowly)    Frequency    Min 1X/week      PT Plan         AM-PAC PT "6 Clicks" Mobility   Outcome Measure  Help needed turning from your back to your side while in a flat bed without using bedrails?: A Little Help needed moving from lying on your back to sitting on the side of a flat bed without using bedrails?: A Lot Help needed moving to and from a bed to a chair (including a wheelchair)?: A Lot Help needed standing up from a chair using your arms (e.g., wheelchair or bedside chair)?: A Lot Help needed to walk in hospital room?: Total Help needed climbing 3-5 steps with a railing? : Total 6 Click Score: 11    End of Session Equipment Utilized  During Treatment: Gait belt Activity Tolerance: Patient limited by fatigue;Patient tolerated treatment well Patient left: in bed;with call bell/phone within reach;with bed alarm set;with restraints reapplied;with nursing/sitter in room (bil wrist restraints re-donned, bed in chair posture, RN in room) Nurse Communication: Mobility status;Other (comment) (R wrist anterior aspect bruising under restraint) PT Visit Diagnosis: Unsteadiness on feet  (R26.81);Other abnormalities of gait and mobility (R26.89);Muscle weakness (generalized) (M62.81)     Time: 1610-9604 PT Time Calculation (min) (ACUTE ONLY): 33 min  Charges:    $Therapeutic Exercise: 8-22 mins $Therapeutic Activity: 8-22 mins PT General Charges $$ ACUTE PT VISIT: 1 Visit                     Joyce Leckey P., PTA Acute Rehabilitation Services Secure Chat Preferred 9a-5:30pm Office: [phone]    [name] [facility] 03/17/2023, 1:10 PM

## 2023-03-17 NOTE — Progress Notes (Signed)
[facility]  Subjective  - More alert and oriented to placed and person   Objective (!) 178/98 63 97.7 F (36.5 C) 18 100%  Intake/Output Summary (Last 24 hours) at 03/17/2023 0715 Last data filed at 03/16/2023 2115 Gross per 24 hour  Intake 180 ml  Output 1600 ml  Net -1420 ml    Left UE dressing removed Incision clean and dry, no erythema or drainage Palpable radial pulse   Assessment/Planning: POD #2  Placement of temporary dialysis catheter (20cm RIJ) using ultrasound and fluoroscopic guidance Harvest of left basilic vein Direct repair of left brachial artery with vein patch angioplasty Excision of left arm arteriovenous graft   Left UE incisions healing well, left open to air Temp cath working well for HD Pending plans for [facility] and then new access once he heals the left UE.  Staples will be maintained until; Op follow up in 2-3 weeks.  Will be available for [facility] placement when deemed stable with infection.    [name] 03/17/2023 7:15 AM --  Laboratory Lab Results: Recent Labs    03/15/23 0420 03/16/23 0546  WBC 4.9 6.2  HGB 9.0* 11.7*  HCT 28.1* 35.9*  PLT 94* 127*   BMET Recent Labs    03/15/23 0420 03/16/23 0546  NA 137 134*  K 3.4* 4.3  CL 100 96*  CO2 26 20*  GLUCOSE 99 149*  BUN 17 27*  CREATININE 4.06* 4.87*  CALCIUM 7.1* 7.1*    COAG Lab Results  Component Value Date   INR 1.1 03/06/2023   INR 1.1 08/30/2020   No results found for: "PTT"

## 2023-03-17 NOTE — Progress Notes (Signed)
PHARMACY CONSULT NOTE FOR:  OUTPATIENT  PARENTERAL ANTIBIOTIC THERAPY (OPAT)  Informational OPAT orders as the patient will be receiving antibiotics outpatient with hemodialysis  Indication: MRSA AVG infection Regimen: Vancomycin 750 mg/HD-MWF End date: 04/26/23 (6 weeks from AVG removal on 10/2)  IV antibiotic discharge orders are pended. To discharging provider:  please sign these orders via discharge navigator,  Select New Orders & click on the button choice - Manage This Unsigned Work.     Thank you for allowing pharmacy to be a part of this patient's care.  Georgina Pillion, PharmD, BCPS, BCIDP Infectious Diseases Clinical Pharmacist 03/17/2023 1:59 PM   **Pharmacist phone directory can now be found on [email or web address] ([email or web address]).  Listed under [facility].

## 2023-03-17 NOTE — Progress Notes (Signed)
Noted red mark to right wrist loosened and repositioned restraint

## 2023-03-17 NOTE — Progress Notes (Signed)
PROGRESS NOTE    [name] [name]  ONG:[id number] DOB: [birth year] DOA: 03/06/2023 PCP: Farris Has, MD   Brief Narrative: 62 year old with past medical history significant for ESRD on hemodialysis MWF, hypertension, diabetes type 2, pulmonary hypertension, cardiomyopathy, GERD, dyslipidemia, anemia of CKD presents to the ED with worsening mental status.  He was admitted with acute metabolic encephalopathy secondary to UTI and COVID-pneumonia and also missed hemodialysis sections.  He was found to have MRSA and enterococcal bacteremia. He was started on Vancomycin.  ID has been following.  Patient was transferred to [facility] for fistula removal.  Vascular surgery was notified.     Assessment & Plan:   Principal Problem:   Acute metabolic encephalopathy Active Problems:   Essential hypertension   Neuropathy   Anemia of chronic disease   Hypokalemia   Hypothyroidism   GERD (gastroesophageal reflux disease)   ESRD (end stage renal disease) (HCC)   Protein-calorie malnutrition, severe  1-MRSA and Enterococcus bacteremia -No significant findings on ultrasound of fistula or CT maxillofacial  -ID following -Concern  for infection in the AV graft.  Plan for excision of graft today 10/2 -Echocardiogram raise concern for endocarditis -TEE is being pursued -Due to COVID cardiology cannot do this procedure until later this week.  Tentatively scheduled 10/4 TEE couldn't be done because patient was taken to HD> cardiology will try to reschedule TEE for Monday.    COVID infection Completed course of Remdesivir Completed steroid.  Continue isolation.  He tested +9/23.   Off isolation since 10/03   ESRD on hemodialysis MWF Managed per nephrology  Acute metabolic encephalopathy In the setting of UTI/COVID, missed hemodialysis and bacteremia Likely has some cognitive impairment at baseline Alert, less confuse.   Anemia of chronic kidney disease Stable  Hypertension Continue with  metoprolol and amlodipine.   Diabetes type 2: Continue with sliding scale insulin  Hypothyroidism Continue with Synthroid  GERD: Continue  with PPI   Thrombocytopenia.  Improving.    Hypoalbuminemia: On supplements       Nutrition Problem: Severe Malnutrition Etiology: chronic illness    Signs/Symptoms: severe muscle depletion, severe fat depletion    Interventions: Nepro shake  Estimated body mass index is 19.9 kg/m as calculated from the following:   Height as of this encounter: 5\' 10"  (1.778 m).   Weight as of this encounter: 62.9 kg.   DVT prophylaxis: SCDs Code Status: Full code Family Communication: Mother updated over phone 10/03 Disposition Plan:  Status is: Inpatient Remains inpatient appropriate because: Management of COVID, bacteremia.     Consultants:  Nephrology ID Vascular-Dr. Lenell Antu Procedures:    Antimicrobials:    Subjective: He is alert, confuse.  Denies pain   Objective: Vitals:   03/17/23 1453 03/17/23 1500 03/17/23 1530 03/17/23 1600  BP: 122/77 106/78 111/70 113/74  Pulse: 72 76 76 71  Resp: 17 13 13 14   Temp:      TempSrc:      SpO2: 100% 100% 100% 98%  Weight:      Height:        Intake/Output Summary (Last 24 hours) at 03/17/2023 1617 Last data filed at 03/17/2023 1003 Gross per 24 hour  Intake 180 ml  Output 0 ml  Net 180 ml   Filed Weights   03/10/23 0945 03/10/23 1330 03/17/23 1253  Weight: 63.1 kg 62 kg 62.9 kg    Examination:  General exam: NAD Respiratory system: CTA Cardiovascular system: S 1, S 2 RRR Gastrointestinal system: BS present, soft nt Central  nervous system: Alert   Data Reviewed: I have personally reviewed following labs and imaging studies  CBC: Recent Labs  Lab 03/13/23 0555 03/15/23 0420 03/16/23 0546  WBC 5.6 4.9 6.2  HGB 9.9* 9.0* 11.7*  HCT 31.2* 28.1* 35.9*  MCV 90.2 88.1 89.8  PLT 99* 94* 127*   Basic Metabolic Panel: Recent Labs  Lab 03/11/23 1112  03/13/23 0555 03/15/23 0420 03/16/23 0546  NA 134* 135 137 134*  K 2.9* 3.0* 3.4* 4.3  CL 96* 101 100 96*  CO2 27 24 26  20*  GLUCOSE 140* 86 99 149*  BUN 20 29* 17 27*  CREATININE 4.56* 5.68* 4.06* 4.87*  CALCIUM 7.0* 7.0* 7.1* 7.1*  PHOS  --   --  2.1* 4.4   GFR: Estimated Creatinine Clearance: 13.6 mL/min (A) (by C-G formula based on SCr of 4.87 mg/dL (H)). Liver Function Tests: Recent Labs  Lab 03/14/23 1152 03/15/23 0420 03/16/23 0546  AST 33  --   --   ALT 17  --   --   ALKPHOS 167*  --   --   BILITOT 0.9  --   --   PROT 6.5  --   --   ALBUMIN 2.1* 1.7* 1.8*   No results for input(s): "LIPASE", "AMYLASE" in the last 168 hours. No results for input(s): "AMMONIA" in the last 168 hours. Coagulation Profile: No results for input(s): "INR", "PROTIME" in the last 168 hours. Cardiac Enzymes: No results for input(s): "CKTOTAL", "CKMB", "CKMBINDEX", "TROPONINI" in the last 168 hours. BNP (last 3 results) No results for input(s): "PROBNP" in the last 8760 hours. HbA1C: No results for input(s): "HGBA1C" in the last 72 hours. CBG: Recent Labs  Lab 03/16/23 1406 03/16/23 1643 03/16/23 1713 03/17/23 0914 03/17/23 1117  GLUCAP 136* 64* 81 77 86   Lipid Profile: No results for input(s): "CHOL", "HDL", "LDLCALC", "TRIG", "CHOLHDL", "LDLDIRECT" in the last 72 hours. Thyroid Function Tests: No results for input(s): "TSH", "T4TOTAL", "FREET4", "T3FREE", "THYROIDAB" in the last 72 hours. Anemia Panel: No results for input(s): "VITAMINB12", "FOLATE", "FERRITIN", "TIBC", "IRON", "RETICCTPCT" in the last 72 hours. Sepsis Labs: No results for input(s): "PROCALCITON", "LATICACIDVEN" in the last 168 hours.  Recent Results (from the past 240 hour(s))  Culture, blood (Routine X 2) w Reflex to ID Panel     Status: None   Collection Time: 03/09/23  3:54 PM   Specimen: BLOOD RIGHT ARM  Result Value Ref Range Status   Specimen Description BLOOD RIGHT ARM  Final   Special Requests    Final    BOTTLES DRAWN AEROBIC ONLY Blood Culture results may not be optimal due to an excessive volume of blood received in culture bottles   Culture   Final    NO GROWTH 5 DAYS Performed at [facility], [address], [place], [place] [address]    Report Status 03/14/2023 FINAL  Final  Culture, blood (Routine X 2) w Reflex to ID Panel     Status: None   Collection Time: 03/09/23  3:54 PM   Specimen: BLOOD RIGHT ARM  Result Value Ref Range Status   Specimen Description BLOOD RIGHT ARM  Final   Special Requests   Final    BOTTLES DRAWN AEROBIC AND ANAEROBIC Blood Culture adequate volume   Culture   Final    NO GROWTH 5 DAYS Performed at [facility], [address] [place], [place] [address]    Report Status 03/14/2023 FINAL  Final  Aerobic/Anaerobic Culture  w Gram Stain (surgical/deep wound)     Status: None (Preliminary result)   Collection Time: 03/15/23  4:02 PM   Specimen: Hemodialysis Graft; Blood  Result Value Ref Range Status   Specimen Description HEMODIALYSIS GRAFT LEFT ARM  Final   Special Requests NONE  Final   Gram Stain   Final    RARE WBC PRESENT, PREDOMINANTLY PMN NO ORGANISMS SEEN    Culture   Final    NO GROWTH 2 DAYS NO ANAEROBES ISOLATED; CULTURE IN PROGRESS FOR 5 DAYS Performed at [facility], [address] [place], [place] [address]    Report Status PENDING  Incomplete         Radiology Studies: DG Chest Port 1 View  Result Date: 03/15/2023 CLINICAL DATA:  Post dialysis catheter insertion EXAM: PORTABLE CHEST 1 VIEW COMPARISON:  03/06/2023 FINDINGS: Shallow inspiration. A right central venous catheter has been placed with tip projecting over the right atrium. No pneumothorax. Heart size is normal. Lungs are clear. No pleural effusions. No pneumothorax. Skin clips over the left axilla. Degenerative changes in the spine and shoulders. IMPRESSION: Right central venous catheter placed with tip projecting over the right atrium. No  pneumothorax. Lungs are clear. Electronically Signed   By: Burman Nieves M.D.   On: 03/15/2023 21:22   PERIPHERAL VASCULAR CATHETERIZATION  Result Date: 03/15/2023 See surgical note for result.       Scheduled Meds:  amLODipine  5 mg Oral q morning   atorvastatin  20 mg Oral q morning   Chlorhexidine Gluconate Cloth  6 each Topical Q0600   Chlorhexidine Gluconate Cloth  6 each Topical Q0600   Chlorhexidine Gluconate Cloth  6 each Topical Q0600   darbepoetin (ARANESP) injection - DIALYSIS  60 mcg Subcutaneous Q Thu-1800   feeding supplement (NEPRO CARB STEADY)  237 mL Oral TID BM   influenza vac split trivalent PF  0.5 mL Intramuscular Tomorrow-1000   insulin aspart  0-9 Units Subcutaneous TID WC   levothyroxine  25 mcg Oral QAC breakfast   liver oil-zinc oxide   Topical Daily   metoprolol tartrate  50 mg Oral BID   multivitamin with minerals  1 tablet Oral Daily   mupirocin ointment   Nasal BID   pantoprazole  40 mg Oral Daily   sodium chloride flush  10-40 mL Intracatheter Q12H   sodium chloride flush  3 mL Intravenous Q12H   Continuous Infusions:  sodium chloride     sodium chloride     sodium chloride     anticoagulant sodium citrate     vancomycin 750 mg (03/17/23 1506)     LOS: 11 days    Time spent: 35 minutes    Refugia Laneve A Karmon Andis, MD [facility]   If 7PM-7AM, please contact night-coverage [email or web address]  03/17/2023, 4:17 PM

## 2023-03-17 NOTE — Progress Notes (Signed)
Patient yelling on and off,wanting wrist restraint off. Patient,even with restraint on continues to tug on his hemodialysis catheter. Explained multiple times the reason why he has restraints on but patient continues to yell out and attempts to remove restraints. Howerter,MD notified. 01:55 RN went to give med to patient but he has quieted down and is dozing off. Held off on haldol for now. Will continue to monitor.

## 2023-03-17 NOTE — Inpatient Diabetes Management (Signed)
Inpatient Diabetes Program Recommendations  AACE/ADA: New Consensus Statement on Inpatient Glycemic Control (2015)  Target Ranges:  Prepandial:   less than 140 mg/dL      Peak postprandial:   less than 180 mg/dL (1-2 hours)      Critically ill patients:  140 - 180 mg/dL   Lab Results  Component Value Date   GLUCAP 77 03/17/2023   HGBA1C 5.1 01/11/2023    Review of Glycemic Control  Latest Reference Range & Units 03/16/23 14:06 03/16/23 16:43 03/16/23 17:13 03/17/23 09:14  Glucose-Capillary 70 - 99 mg/dL 629 (H) 64 (L) 81 77  (H): Data is abnormally high (L): Data is abnormally low Diabetes history: Type 2 DM Outpatient Diabetes medications: none Current orders for Inpatient glycemic control: Novolog 0-9 units TID  Inpatient Diabetes Program Recommendations:    Noted hypoglycemia of 64 mg/dL following Novolog 1 units.  With renal status, consider reducing correction to Novolog 0-6 units TID.   Thanks, Lujean Rave, MSN, RNC-OB Diabetes Coordinator [phone] (8a-5p)

## 2023-03-17 NOTE — Progress Notes (Signed)
   03/17/23 1621  Vitals  Temp 97.8 F (36.6 C)  Pulse Rate 71  Resp 16  BP 110/67  SpO2 99 %  O2 Device Room Air  Weight 60.9 kg  Type of Weight Post-Dialysis  Oxygen Therapy  Patient Activity (if Appropriate) In bed  Pulse Oximetry Type Continuous  Oximetry Probe Site Changed No  Post Treatment  Dialyzer Clearance Lightly streaked  Hemodialysis Intake (mL) 0 mL  Liters Processed 72  Fluid Removed (mL) 2000 mL  Tolerated HD Treatment Yes   Received patient in bed to unit.  Alert and oriented. To person Informed consent signed and in chart.   TX duration:3.0  Patient tolerated well.  Transported back to the room  Alert, without acute distress.  Hand-off given to patient's nurse.   Access used: RIJDLC Access issues: no complications  Total UF removed: 2000 Medication(s) given: vancomycin 750mg  iv x 1   [facility]

## 2023-03-17 NOTE — Pre-Procedure Instructions (Signed)
Dr Helayne Seminole to see if he wants to proceed with TCS on 03/23/2023. Patient is currently an in patient at another [facility] facility.

## 2023-03-17 NOTE — Progress Notes (Signed)
[facility] Progress Note   Subjective:   Patient seen in room. He is in bilateral wrist restraints. Calm, denies SOB, CP, dizziness, nausea.   Objective Vitals:   03/16/23 1640 03/16/23 2041 03/17/23 0559 03/17/23 0830  BP: (!) 144/67 (!) 156/68 (!) 178/98 (!) 177/84  Pulse: 68 65 63 60  Resp: 16  18 17   Temp: 97.8 F (36.6 C) 97.7 F (36.5 C) 97.7 F (36.5 C) 97.7 F (36.5 C)  TempSrc: Oral   Oral  SpO2: 100%  100% 100%  Weight:      Height:       Physical Exam General: Alert male in NAD Heart: RRR, no murmurs, rubs or gallops Lungs: CTA anteriorly, respirations unlabored Abdomen: Soft, non-distended, +BS Extremities: No edema b/l lower extremities Dialysis Access: temp cath  Additional Objective Labs: Basic Metabolic Panel: Recent Labs  Lab 03/13/23 0555 03/15/23 0420 03/16/23 0546  NA 135 137 134*  K 3.0* 3.4* 4.3  CL 101 100 96*  CO2 24 26 20*  GLUCOSE 86 99 149*  BUN 29* 17 27*  CREATININE 5.68* 4.06* 4.87*  CALCIUM 7.0* 7.1* 7.1*  PHOS  --  2.1* 4.4   Liver Function Tests: Recent Labs  Lab 03/14/23 1152 03/15/23 0420 03/16/23 0546  AST 33  --   --   ALT 17  --   --   ALKPHOS 167*  --   --   BILITOT 0.9  --   --   PROT 6.5  --   --   ALBUMIN 2.1* 1.7* 1.8*   No results for input(s): "LIPASE", "AMYLASE" in the last 168 hours. CBC: Recent Labs  Lab 03/10/23 1005 03/13/23 0555 03/15/23 0420 03/16/23 0546  WBC 6.9 5.6 4.9 6.2  HGB 10.0* 9.9* 9.0* 11.7*  HCT 31.1* 31.2* 28.1* 35.9*  MCV 86.6 90.2 88.1 89.8  PLT 78* 99* 94* 127*   Blood Culture    Component Value Date/Time   SDES HEMODIALYSIS GRAFT LEFT ARM 03/15/2023 1602   SPECREQUEST NONE 03/15/2023 1602   CULT  03/15/2023 1602    NO GROWTH 2 DAYS Performed at [facility], [address] [place], [place] [address]    REPTSTATUS PENDING 03/15/2023 1602    Cardiac Enzymes: No results for input(s): "CKTOTAL", "CKMB", "CKMBINDEX", "TROPONINI" in the last  168 hours. CBG: Recent Labs  Lab 03/15/23 2106 03/16/23 1022 03/16/23 1406 03/16/23 1643 03/16/23 1713  GLUCAP 137* 103* 136* 64* 81   Iron Studies: No results for input(s): "IRON", "TIBC", "TRANSFERRIN", "FERRITIN" in the last 72 hours. @lablastinr3 @ Studies/Results: DG Chest Port 1 View  Result Date: 03/15/2023 CLINICAL DATA:  Post dialysis catheter insertion EXAM: PORTABLE CHEST 1 VIEW COMPARISON:  03/06/2023 FINDINGS: Shallow inspiration. A right central venous catheter has been placed with tip projecting over the right atrium. No pneumothorax. Heart size is normal. Lungs are clear. No pleural effusions. No pneumothorax. Skin clips over the left axilla. Degenerative changes in the spine and shoulders. IMPRESSION: Right central venous catheter placed with tip projecting over the right atrium. No pneumothorax. Lungs are clear. Electronically Signed   By: Burman Nieves M.D.   On: 03/15/2023 21:22   PERIPHERAL VASCULAR CATHETERIZATION  Result Date: 03/15/2023 See surgical note for result.  HYBRID OR IMAGING ([facility] ONLY)  Result Date: 03/15/2023 There is no interpretation for this exam.  This order is for images obtained during a surgical procedure.  Please See "Surgeries" Tab for more information regarding the procedure.   Medications:  sodium chloride     sodium chloride     sodium chloride     anticoagulant sodium citrate     vancomycin Stopped (03/14/23 0944)    amLODipine  5 mg Oral q morning   atorvastatin  20 mg Oral q morning   Chlorhexidine Gluconate Cloth  6 each Topical Q0600   Chlorhexidine Gluconate Cloth  6 each Topical Q0600   Chlorhexidine Gluconate Cloth  6 each Topical Q0600   darbepoetin (ARANESP) injection - DIALYSIS  60 mcg Subcutaneous Q Thu-1800   feeding supplement (NEPRO CARB STEADY)  237 mL Oral TID BM   influenza vac split trivalent PF  0.5 mL Intramuscular Tomorrow-1000   insulin aspart  0-9 Units Subcutaneous TID WC   levothyroxine  25 mcg Oral QAC  breakfast   liver oil-zinc oxide   Topical Daily   metoprolol tartrate  50 mg Oral BID   multivitamin with minerals  1 tablet Oral Daily   mupirocin ointment   Nasal BID   pantoprazole  40 mg Oral Daily   sodium chloride flush  10-40 mL Intracatheter Q12H   sodium chloride flush  3 mL Intravenous Q12H    Dialysis Orders[facility] MWF. 4 hours. EDW 64 kg. LUE AVG 15-gauge. Flow rates: 400/autoflow 1.5. 3K/2.5 calcium. No heparin. Mircera 75 mcg every 2 weeks (last dose 9/11). Venofer 50 mg q. weekly.   Assessment/Plan: 1.  MRSA and E faecalis bacteremia - ID following. On vancomycin. TEE planned for 10/4.  Concern for seeding of AVG, VVS removed yesterday with no obvious infection surround it. Temporary cath placed.  Plan for [facility] +/- new permanent access placement once LUE heals/infection improves. TEE today. ESRD- HD MWF. HD today per regular schedule COVID-19 PNA - on remdesivir.  Out of isolation.  Hypokalemia - chronic issue, liberalize diet. Improved. Using increased K bath.  Altered mental status - waxing and wanning.  Possible AVG infection- removed yesterday on 10/2 w/temp cath placement. Will need TDC placed prior to discharge once bacteremia resolved. May also place new permanent access per notes.  HTN/volume -BP elevated today, follow post HD.  Hx hypotension.  Under edw, likely weight loss.  UF as tolerated. Plan for lower dry weight on d/c.  Anemia of CKD -Hgb 11.7, hold ESA for now.  Has not been given during this admission. Secondary hyperparathyroidism -placed on binders, corrected calcium and phosphorus at goal Nutrition - Carb modified diet w/Fluid restrictions.  DMT2 - per PMD Debility - chronic issue.  Would benefit from SNF placement but previously has refused. Falling at home per caretaker when discussed at outpatient HD. Cared for by 44 year old mother who does her best but can not pick him up when he falls per her own account.   Rogers Blocker, PA-C 03/17/2023,  8:45 AM  [facility] Pager: [phone]

## 2023-03-18 DIAGNOSIS — G9341 Metabolic encephalopathy: Secondary | ICD-10-CM | POA: Diagnosis not present

## 2023-03-18 LAB — GLUCOSE, CAPILLARY
Glucose-Capillary: 87 mg/dL (ref 70–99)
Glucose-Capillary: 106 mg/dL — ABNORMAL HIGH (ref 70–99)
Glucose-Capillary: 118 mg/dL — ABNORMAL HIGH (ref 70–99)
Glucose-Capillary: 129 mg/dL — ABNORMAL HIGH (ref 70–99)

## 2023-03-18 NOTE — Progress Notes (Incomplete)
Patient: *** DOB: [birth year] VWU:[id number] Transferring facility: [facility] ED Requesting provider: Bailey Mech, PA (EDP at [facility] ED) Reason for transfer: admission for further evaluation and management of acute bilateral pulmonary emboli.   6 F w/ h/o DM2, who presented to [facility] ED complaining of 1 to 2 days of new sharp, stabbing, nonradiating chest discomfort that worsens with inspiration.  Nonexertional.  Associated with some mild shortness of breath in the absence of any nausea, vomiting, diaphoresis, palpitations.  Not reported to be on any blood thinners as an outpatient.  Vital signs in the ED were notable for the following: Afebrile; heart rates in the 120s, reported to be sinus tachycardia; normotensive, with most recent blood pressure 128/81; respiratory rates in the 20s; oxygen saturations in the mid 90s on room air, although she was subsequently started on 2 L nasal cannula for patient comfort.   Labs were notable for elevated D-dimer; CBC notable for white blood cell count 8000, hemoglobin 14.  BMP notable for creatinine 1.2, glucose 243.  Imaging notable for CTA chest showed evidence of acute bilateral pulmonary emboli, with suggestion of right heart strain, without evidence of infiltrate, edema, effusion, or pneumothorax.  Medications administered prior to transfer included the following: Heparin bolus followed by heparin drip.   [facility] ED[facility] discussed patient's case with their on-call hospitalist, who was hesitant to accept the patient in the setting of the radiographic evidence of right heart strain.    Subsequently, I accepted this patient for transfer for inpatient admission to a pcu bed at [facility] for further work-up and management of the above.   Reason/necessity for transfer on the basis of need for higher level of care.     Thank you,   Newton Pigg

## 2023-03-18 NOTE — Progress Notes (Signed)
PROGRESS NOTE    [name] [name]  VFI:[id number] DOB: [birth year] DOA: 03/06/2023 PCP: Farris Has, MD   Brief Narrative: 62 year old with past medical history significant for ESRD on hemodialysis MWF, hypertension, diabetes type 2, pulmonary hypertension, cardiomyopathy, GERD, dyslipidemia, anemia of CKD presents to the ED with worsening mental status.  He was admitted with acute metabolic encephalopathy secondary to UTI and COVID-pneumonia and also missed hemodialysis sections.  He was found to have MRSA and enterococcal bacteremia. He was started on Vancomycin.  ID has been following.  Patient was transferred to [facility] for fistula removal.  Vascular surgery was notified.     Assessment & Plan:   Principal Problem:   Acute metabolic encephalopathy Active Problems:   Essential hypertension   Neuropathy   Anemia of chronic disease   Hypokalemia   Hypothyroidism   GERD (gastroesophageal reflux disease)   ESRD (end stage renal disease) (HCC)   Protein-calorie malnutrition, severe  1-MRSA and Enterococcus bacteremia -No significant findings on ultrasound of fistula or CT maxillofacial  -ID following -Concern  for infection in the AV graft.  Underwent  excision of graft  10/2 -Echocardiogram raise concern for endocarditis -TEE is being pursued -Due to COVID cardiology cannot do this procedure until later this week.  Tentatively scheduled 10/4 TEE couldn't be done because patient was taken to HD> cardiology will try to reschedule TEE for Monday.    COVID infection Completed course of Remdesivir Completed steroid.  Continue isolation.  He tested +9/23.   Off isolation since 10/03   ESRD on hemodialysis MWF Managed per nephrology Underwent excision of AV fistula due to infection.  Plan to placed tunneled HD next week.   Acute metabolic encephalopathy In the setting of UTI/COVID, missed hemodialysis and bacteremia Likely has some cognitive impairment at baseline Alert,  less confuse.  He fell last night   Anemia of chronic kidney disease Stable  Hypertension Continue with metoprolol and amlodipine.   Diabetes type 2: Continue with sliding scale insulin  Hypothyroidism Continue with Synthroid  GERD: Continue  with PPI   Thrombocytopenia.  Improving.    Hypoalbuminemia: On supplements       Nutrition Problem: Severe Malnutrition Etiology: chronic illness    Signs/Symptoms: severe muscle depletion, severe fat depletion    Interventions: Nepro shake  Estimated body mass index is 19.26 kg/m as calculated from the following:   Height as of this encounter: 5\' 10"  (1.778 m).   Weight as of this encounter: 60.9 kg.   DVT prophylaxis: SCDs Code Status: Full code Family Communication: Mother updated over phone 10/03 Disposition Plan:  Status is: Inpatient Remains inpatient appropriate because: Management of COVID, bacteremia.     Consultants:  Nephrology ID Vascular-Dr. Lenell Antu Procedures:    Antimicrobials:    Subjective: He denies headaches. Oriented times 3.   Objective: Vitals:   03/18/23 0102 03/18/23 0346 03/18/23 0614 03/18/23 0923  BP: (!) 166/74 (!) 164/94 (!) 188/90 (!) 168/88  Pulse: 68 72 70 74  Resp: 17 17 17    Temp: 98.3 F (36.8 C) (!) 97.4 F (36.3 C) 98.1 F (36.7 C) 98 F (36.7 C)  TempSrc: Oral Oral Oral Oral  SpO2: 100% 99% 100% 99%  Weight:      Height:        Intake/Output Summary (Last 24 hours) at 03/18/2023 1440 Last data filed at 03/18/2023 0900 Gross per 24 hour  Intake 540.2 ml  Output 2000 ml  Net -1459.8 ml   Filed Weights   03/10/23  1330 03/17/23 1253 03/17/23 1621  Weight: 62 kg 62.9 kg 60.9 kg    Examination:  General exam: NAD Respiratory system: CTA Cardiovascular system: S1, S 2 RRR Gastrointestinal system: BS present, soft, NT Central nervous system: Alert   Data Reviewed: I have personally reviewed following labs and imaging studies  CBC: Recent Labs   Lab 03/13/23 0555 03/15/23 0420 03/16/23 0546  WBC 5.6 4.9 6.2  HGB 9.9* 9.0* 11.7*  HCT 31.2* 28.1* 35.9*  MCV 90.2 88.1 89.8  PLT 99* 94* 127*   Basic Metabolic Panel: Recent Labs  Lab 03/13/23 0555 03/15/23 0420 03/16/23 0546  NA 135 137 134*  K 3.0* 3.4* 4.3  CL 101 100 96*  CO2 24 26 20*  GLUCOSE 86 99 149*  BUN 29* 17 27*  CREATININE 5.68* 4.06* 4.87*  CALCIUM 7.0* 7.1* 7.1*  PHOS  --  2.1* 4.4   GFR: Estimated Creatinine Clearance: 13.2 mL/min (A) (by C-G formula based on SCr of 4.87 mg/dL (H)). Liver Function Tests: Recent Labs  Lab 03/14/23 1152 03/15/23 0420 03/16/23 0546  AST 33  --   --   ALT 17  --   --   ALKPHOS 167*  --   --   BILITOT 0.9  --   --   PROT 6.5  --   --   ALBUMIN 2.1* 1.7* 1.8*   No results for input(s): "LIPASE", "AMYLASE" in the last 168 hours. No results for input(s): "AMMONIA" in the last 168 hours. Coagulation Profile: No results for input(s): "INR", "PROTIME" in the last 168 hours. Cardiac Enzymes: No results for input(s): "CKTOTAL", "CKMB", "CKMBINDEX", "TROPONINI" in the last 168 hours. BNP (last 3 results) No results for input(s): "PROBNP" in the last 8760 hours. HbA1C: No results for input(s): "HGBA1C" in the last 72 hours. CBG: Recent Labs  Lab 03/17/23 1117 03/17/23 1719 03/17/23 2106 03/18/23 0729 03/18/23 1137  GLUCAP 86 73 103* 87 106*   Lipid Profile: No results for input(s): "CHOL", "HDL", "LDLCALC", "TRIG", "CHOLHDL", "LDLDIRECT" in the last 72 hours. Thyroid Function Tests: No results for input(s): "TSH", "T4TOTAL", "FREET4", "T3FREE", "THYROIDAB" in the last 72 hours. Anemia Panel: No results for input(s): "VITAMINB12", "FOLATE", "FERRITIN", "TIBC", "IRON", "RETICCTPCT" in the last 72 hours. Sepsis Labs: No results for input(s): "PROCALCITON", "LATICACIDVEN" in the last 168 hours.  Recent Results (from the past 240 hour(s))  Culture, blood (Routine X 2) w Reflex to ID Panel     Status: None    Collection Time: 03/09/23  3:54 PM   Specimen: BLOOD RIGHT ARM  Result Value Ref Range Status   Specimen Description BLOOD RIGHT ARM  Final   Special Requests   Final    BOTTLES DRAWN AEROBIC ONLY Blood Culture results may not be optimal due to an excessive volume of blood received in culture bottles   Culture   Final    NO GROWTH 5 DAYS Performed at [facility], [address], [place], [place] [address]    Report Status 03/14/2023 FINAL  Final  Culture, blood (Routine X 2) w Reflex to ID Panel     Status: None   Collection Time: 03/09/23  3:54 PM   Specimen: BLOOD RIGHT ARM  Result Value Ref Range Status   Specimen Description BLOOD RIGHT ARM  Final   Special Requests   Final    BOTTLES DRAWN AEROBIC AND ANAEROBIC Blood Culture adequate volume   Culture   Final    NO GROWTH 5 DAYS Performed  at [facility], [address], [place], [place] [address]    Report Status 03/14/2023 FINAL  Final  Aerobic/Anaerobic Culture w Gram Stain (surgical/deep wound)     Status: None (Preliminary result)   Collection Time: 03/15/23  4:02 PM   Specimen: Hemodialysis Graft; Blood  Result Value Ref Range Status   Specimen Description HEMODIALYSIS GRAFT LEFT ARM  Final   Special Requests NONE  Final   Gram Stain   Final    RARE WBC PRESENT, PREDOMINANTLY PMN NO ORGANISMS SEEN    Culture   Final    NO GROWTH 3 DAYS NO ANAEROBES ISOLATED; CULTURE IN PROGRESS FOR 5 DAYS Performed at [facility], [address] [place], [place] [address]    Report Status PENDING  Incomplete         Radiology Studies: No results found.      Scheduled Meds:  amLODipine  5 mg Oral q morning   atorvastatin  20 mg Oral q morning   Chlorhexidine Gluconate Cloth  6 each Topical Q0600   Chlorhexidine Gluconate Cloth  6 each Topical Q0600   Chlorhexidine Gluconate Cloth  6 each Topical Q0600   darbepoetin (ARANESP) injection - DIALYSIS  60 mcg Subcutaneous Q Thu-1800   feeding  supplement (NEPRO CARB STEADY)  237 mL Oral TID BM   influenza vac split trivalent PF  0.5 mL Intramuscular Tomorrow-1000   insulin aspart  0-9 Units Subcutaneous TID WC   levothyroxine  25 mcg Oral QAC breakfast   liver oil-zinc oxide   Topical Daily   metoprolol tartrate  50 mg Oral BID   multivitamin with minerals  1 tablet Oral Daily   mupirocin ointment   Nasal BID   pantoprazole  40 mg Oral Daily   sodium chloride flush  10-40 mL Intracatheter Q12H   sodium chloride flush  3 mL Intravenous Q12H   Continuous Infusions:  sodium chloride     sodium chloride     sodium chloride     vancomycin Stopped (03/17/23 1606)     LOS: 12 days    Time spent: 35 minutes    Rebeca Valdivia A Treina Arscott, MD [facility]   If 7PM-7AM, please contact night-coverage [email or web address]  03/18/2023, 2:40 PM

## 2023-03-18 NOTE — Progress Notes (Signed)
[facility] [place] PROGRESS NOTE  ASSESSMENT / PLAN: [name] [name] is a 62 y.o. male with bacteremia, status post left arteriovenous graft excision and placement of a temporary dialysis catheter.  Will plan to place a tunneled dialysis catheter early this week as long as cultures remain negative.  SUBJECTIVE: Wants to call his mother.  OBJECTIVE: BP (!) 168/88 (BP Location: Right Arm)   Pulse 74   Temp 98 F (36.7 C) (Oral)   Resp 17   Ht 5\' 10"  (1.778 m)   Wt 60.9 kg   SpO2 99%   BMI 19.26 kg/m   Intake/Output Summary (Last 24 hours) at 03/18/2023 1135 Last data filed at 03/18/2023 0900 Gross per 24 hour  Intake 540.2 ml  Output 2000 ml  Net -1459.8 ml    No acute distress Regular rate and rhythm Unlabored breathing Right neck tunneled dialysis catheter in place Left arm incisions clean dry and intact     Latest Ref Rng & Units 03/16/2023    5:46 AM 03/15/2023    4:20 AM 03/13/2023    5:55 AM  CBC  WBC 4.0 - 10.5 K/uL 6.2  4.9  5.6   Hemoglobin 13.0 - 17.0 g/dL 16.1  9.0  9.9   Hematocrit 39.0 - 52.0 % 35.9  28.1  31.2   Platelets 150 - 400 K/uL 127  94  99         Latest Ref Rng & Units 03/16/2023    5:46 AM 03/15/2023    4:20 AM 03/14/2023   11:52 AM  CMP  Glucose 70 - 99 mg/dL 096  99    BUN 8 - 23 mg/dL 27  17    Creatinine 0.45 - 1.24 mg/dL 4.09  8.11    Sodium 914 - 145 mmol/L 134  137    Potassium 3.5 - 5.1 mmol/L 4.3  3.4    Chloride 98 - 111 mmol/L 96  100    CO2 22 - 32 mmol/L 20  26    Calcium 8.9 - 10.3 mg/dL 7.1  7.1    Total Protein 6.5 - 8.1 g/dL   6.5   Total Bilirubin 0.3 - 1.2 mg/dL   0.9   Alkaline Phos 38 - 126 U/L   167   AST 15 - 41 U/L   33   ALT 0 - 44 U/L   17     Estimated Creatinine Clearance: 13.2 mL/min (A) (by C-G formula based on SCr of 4.87 mg/dL (H)).  [name]. Lenell Antu, MD [facility] Phone Number: [phone] 03/18/2023 11:35 AM

## 2023-03-18 NOTE — Progress Notes (Signed)
[facility] Progress Note   Subjective:   Alert male, seated in bed in NAD. Denies SOB, CP, dizziness, nausea. Reportedly fell trying to get out of bed this AM.   Objective Vitals:   03/18/23 0020 03/18/23 0102 03/18/23 0346 03/18/23 0614  BP: (!) 182/89 (!) 166/74 (!) 164/94 (!) 188/90  Pulse: 80 68 72 70  Resp: 19 17 17 17   Temp: 98.5 F (36.9 C) 98.3 F (36.8 C) (!) 97.4 F (36.3 C) 98.1 F (36.7 C)  TempSrc: Oral Oral Oral Oral  SpO2: 100% 100% 99% 100%  Weight:      Height:       Physical Exam General: Alert, calm, in NAD Heart: RRR, no murmurs, rubs or gallops Lungs: CTA bilaterally, respirations unlabored on RA Abdomen: Soft, non-distended, +BS Extremities: No edema b/l lower extremities Dialysis Access: temp R internal jugular catheter  Additional Objective Labs: Basic Metabolic Panel: Recent Labs  Lab 03/13/23 0555 03/15/23 0420 03/16/23 0546  NA 135 137 134*  K 3.0* 3.4* 4.3  CL 101 100 96*  CO2 24 26 20*  GLUCOSE 86 99 149*  BUN 29* 17 27*  CREATININE 5.68* 4.06* 4.87*  CALCIUM 7.0* 7.1* 7.1*  PHOS  --  2.1* 4.4   Liver Function Tests: Recent Labs  Lab 03/14/23 1152 03/15/23 0420 03/16/23 0546  AST 33  --   --   ALT 17  --   --   ALKPHOS 167*  --   --   BILITOT 0.9  --   --   PROT 6.5  --   --   ALBUMIN 2.1* 1.7* 1.8*   No results for input(s): "LIPASE", "AMYLASE" in the last 168 hours. CBC: Recent Labs  Lab 03/13/23 0555 03/15/23 0420 03/16/23 0546  WBC 5.6 4.9 6.2  HGB 9.9* 9.0* 11.7*  HCT 31.2* 28.1* 35.9*  MCV 90.2 88.1 89.8  PLT 99* 94* 127*   Blood Culture    Component Value Date/Time   SDES HEMODIALYSIS GRAFT LEFT ARM 03/15/2023 1602   SPECREQUEST NONE 03/15/2023 1602   CULT  03/15/2023 1602    NO GROWTH 2 DAYS NO ANAEROBES ISOLATED; CULTURE IN PROGRESS FOR 5 DAYS Performed at [facility], [address] [place], [place] [address]    REPTSTATUS PENDING 03/15/2023 1602    Cardiac  Enzymes: No results for input(s): "CKTOTAL", "CKMB", "CKMBINDEX", "TROPONINI" in the last 168 hours. CBG: Recent Labs  Lab 03/17/23 0914 03/17/23 1117 03/17/23 1719 03/17/23 2106 03/18/23 0729  GLUCAP 77 86 73 103* 87   Iron Studies: No results for input(s): "IRON", "TIBC", "TRANSFERRIN", "FERRITIN" in the last 72 hours. @lablastinr3 @ Studies/Results: No results found. Medications:  sodium chloride     sodium chloride     sodium chloride     vancomycin Stopped (03/17/23 1606)    amLODipine  5 mg Oral q morning   atorvastatin  20 mg Oral q morning   Chlorhexidine Gluconate Cloth  6 each Topical Q0600   Chlorhexidine Gluconate Cloth  6 each Topical Q0600   Chlorhexidine Gluconate Cloth  6 each Topical Q0600   darbepoetin (ARANESP) injection - DIALYSIS  60 mcg Subcutaneous Q Thu-1800   feeding supplement (NEPRO CARB STEADY)  237 mL Oral TID BM   influenza vac split trivalent PF  0.5 mL Intramuscular Tomorrow-1000   insulin aspart  0-9 Units Subcutaneous TID WC   levothyroxine  25 mcg Oral QAC breakfast   liver oil-zinc oxide   Topical Daily  metoprolol tartrate  50 mg Oral BID   multivitamin with minerals  1 tablet Oral Daily   mupirocin ointment   Nasal BID   pantoprazole  40 mg Oral Daily   sodium chloride flush  10-40 mL Intracatheter Q12H   sodium chloride flush  3 mL Intravenous Q12H    Dialysis Orders: [facility] MWF. 4 hours. EDW 64 kg. LUE AVG 15-gauge. Flow rates: 400/autoflow 1.5. 3K/2.5 calcium. No heparin. Mircera 75 mcg every 2 weeks (last dose 9/11). Venofer 50 mg q. weekly.   Assessment/Plan: 1.  MRSA and E faecalis bacteremia - ID following. On vancomycin. TEE planned for 10/4.  Concern for seeding of AVG, VVS removed yesterday with no obvious infection surround it. Temporary cath placed.  Plan for [facility] +/- new permanent access placement once LUE heals/infection improves. TEE Monday. Hopefully can replace TDC early next week ESRD- HD MWF. Next HD Monday.   COVID-19 PNA - received remdesivir.  Out of isolation.  Hypokalemia - chronic issue, liberalize diet. Improved. Using increased K bath.  Altered mental status - waxing and wanning.  Possible AVG infection- removed on 10/2 w/temp cath placement. See above.  HTN/volume -BP variable,  Hx hypOtension but BP now running high.  Under edw, likely weight loss.  UF as tolerated. Plan for lower dry weight on d/c.  Continue metoprolol and amlodipine, consider increasing amlodipine if BP remains elevated Anemia of CKD -Hgb 11.7, hold ESA for now.  Has not been given during this admission. Secondary hyperparathyroidism -placed on binders, corrected calcium and phosphorus at goal Nutrition - Carb modified diet w/Fluid restrictions.  DMT2 - per PMD Debility - chronic issue.  Would benefit from SNF placement but previously has refused. Falling at home per caretaker when discussed at outpatient HD. Cared for by 90 year old mother who does her best but can not pick him up when he falls per her own account.   Rogers Blocker, PA-C 03/18/2023, 8:36 AM  [facility] Pager: [phone]

## 2023-03-18 NOTE — Progress Notes (Signed)
[facility] night cross cover note:   I was notified by RN that this patient had a suspected unwitnessed ground-level fall, with staff finding the patient on the floor of his room, as prompted by bed alarm.  Patient was noted to be conscious, without interval change in mental status.  He does not report hitting his head, and denies any acute arthralgias, myalgias, headache, or neck pain.  He has evidence of a small right knee abrasion, but denies any associated discomfort with this.  Not on any blood thinners.   Will continue to pursue fall precautions, bed alarm.  There does not appear to be an indication for pursuit of CT head at this time.   Newton Pigg, DO Hospitalist

## 2023-03-18 NOTE — Progress Notes (Signed)
   03/18/23 0020  What Happened  Was fall witnessed? No  Was patient injured? Yes  Patient found on floor  Found by Staff-comment Bernie Covey RN/[employer])  Stated prior activity  (Getting OOB without assistance)  Follow Up  Family notified No - patient refusal  Simple treatment Dressing (Foam applied to right knee)  Progress note created (see row info) Yes  Adult Fall Risk Assessment  Risk Factor Category (scoring not indicated) High fall risk per protocol (document High fall risk)  Patient Fall Risk Level High fall risk  Adult Fall Risk Interventions  Required Bundle Interventions *See Row Information* High fall risk - low, moderate, and high requirements implemented  Additional Interventions PT/OT need assessed if change in mobility from baseline;Use of appropriate toileting equipment (bedpan, BSC, etc.);Reorient/diversional activities with confused patients  Fall intervention(s) refused/Patient educated regarding refusal Open door if unsupervised (Unable to keep door open - due to COVID positive dx)  Screening for Fall Injury Risk (To be completed on HIGH fall risk patients) - Assessing Need for Floor Mats  Risk For Fall Injury- Criteria for Floor Mats Confusion/dementia (+NuDESC, CIWA, TBI, etc.)  Will Implement Floor Mats Yes  Vitals  Temp 98.5 F (36.9 C)  Temp Source Oral  BP (!) 182/89  MAP (mmHg) 112  BP Location Right Arm  BP Method Automatic  Patient Position (if appropriate) Sitting  Pulse Rate 80  Pulse Rate Source Monitor  Resp 19  Oxygen Therapy  SpO2 100 %  O2 Device Room Air  Patient Activity (if Appropriate) In bed  Pulse Oximetry Type Intermittent  Pain Assessment  Pain Scale 0-10  Pain Score 0  Neurological  Neuro (WDL) X  Level of Consciousness Alert  Orientation Level Oriented to person;Oriented to place;Disoriented to time;Disoriented to situation  Cognition Impulsive;Poor judgement;Poor safety awareness;Memory impairment  Speech  Clear  Musculoskeletal  Musculoskeletal (WDL) X  Generalized Weakness Yes  Weight Bearing Restrictions No  Integumentary  Integumentary (WDL) X  Skin Color Appropriate for ethnicity  Skin Condition Dry  Skin Integrity Abrasion;Ecchymosis;Erythema/redness  Abrasion Location Arm;Knee  Abrasion Location Orientation Bilateral  Abrasion Intervention Foam  Ecchymosis Location Arm;Buttocks  Ecchymosis Location Orientation Bilateral  Erythema/Redness Location Arm;Buttocks  Erythema/Redness Location Orientation Right;Left  Skin Turgor Non-tenting   At approximately 0010, the patient's bed alarm activated.  Upon entering the room, the patient was found to be on the floor.  He kept saying he had to go to the bathroom.  He was a maximum two person assist to lift to get back in the bed.  VS stable.  Patient had new abrasion to right knee that was slightly bleeding.  Foam applied.  Patient stated he was not in pain.  He kept stating he had to "pee".  He is incontinent of urine and stool.  He had been using his call bell for the majority of the shift for help to call his mother.  Attempted to help him use the urinal but he did not urinate.  He is oliguric secondary to ESRD on hemodialysis.  No other injuries noted.  He did not want his mother contacted at this time.  Dr. Arlean Hopping notified and no additional orders received.  Will continue to monitor patient.  Bernie Covey RN

## 2023-03-19 DIAGNOSIS — G9341 Metabolic encephalopathy: Secondary | ICD-10-CM | POA: Diagnosis not present

## 2023-03-19 LAB — GLUCOSE, CAPILLARY
Glucose-Capillary: 93 mg/dL (ref 70–99)
Glucose-Capillary: 126 mg/dL — ABNORMAL HIGH (ref 70–99)
Glucose-Capillary: 146 mg/dL — ABNORMAL HIGH (ref 70–99)
Glucose-Capillary: 129 mg/dL — ABNORMAL HIGH (ref 70–99)

## 2023-03-19 LAB — BASIC METABOLIC PANEL
Anion gap: 11 (ref 5–15)
BUN: 21 mg/dL (ref 8–23)
CO2: 24 mmol/L (ref 22–32)
Calcium: 7.5 mg/dL — ABNORMAL LOW (ref 8.9–10.3)
Chloride: 95 mmol/L — ABNORMAL LOW (ref 98–111)
Creatinine, Ser: 4.17 mg/dL — ABNORMAL HIGH (ref 0.61–1.24)
GFR, Estimated: 15 mL/min — ABNORMAL LOW (ref >60)
Glucose, Bld: 171 mg/dL — ABNORMAL HIGH (ref 70–99)
Potassium: 3.3 mmol/L — ABNORMAL LOW (ref 3.5–5.1)
Sodium: 130 mmol/L — ABNORMAL LOW (ref 135–145)

## 2023-03-19 LAB — CBC
HCT: 30.7 % — ABNORMAL LOW (ref 39.0–52.0)
Hemoglobin: 10.1 g/dL — ABNORMAL LOW (ref 13.0–17.0)
MCH: 29.1 pg (ref 26.0–34.0)
MCHC: 32.9 g/dL (ref 30.0–36.0)
MCV: 88.5 fL (ref 80.0–100.0)
Platelets: 151 10*3/uL (ref 150–400)
RBC: 3.47 MIL/uL — ABNORMAL LOW (ref 4.22–5.81)
RDW: 14.6 % (ref 11.5–15.5)
WBC: 7.5 10*3/uL (ref 4.0–10.5)
nRBC: 0 % (ref 0.0–0.2)

## 2023-03-19 MED ORDER — AMLODIPINE BESYLATE 10 MG PO TABS
10.0000 mg | ORAL_TABLET | Freq: Every morning | ORAL | Status: DC
  Administered 2023-03-19 – 2023-03-29 (×8): 10 mg via ORAL
  Filled 2023-03-19 (×10): qty 1

## 2023-03-19 MED ORDER — POLYETHYLENE GLYCOL 3350 17 G PO PACK
17.0000 g | PACK | Freq: Every day | ORAL | Status: DC
  Administered 2023-03-21 – 2023-03-29 (×5): 17 g via ORAL
  Filled 2023-03-19 (×9): qty 1

## 2023-03-19 MED ORDER — SORBITOL 70 % SOLN: 30.0000 mL | Freq: Once | Status: DC

## 2023-03-19 MED ORDER — CHLORHEXIDINE GLUCONATE CLOTH 2 % EX PADS
6.0000 | MEDICATED_PAD | Freq: Every day | CUTANEOUS | Status: DC
  Administered 2023-03-19 – 2023-03-26 (×5): 6 via TOPICAL

## 2023-03-19 NOTE — Anesthesia Preprocedure Evaluation (Signed)
Anesthesia Evaluation  Patient identified by MRN, date of birth, ID band Patient awake    Reviewed: Allergy & Precautions, NPO status , Patient's Chart, lab work & pertinent test results  History of Anesthesia Complications Negative for: history of anesthetic complications  Airway Mallampati: III  TM Distance: >3 FB Neck ROM: Full    Dental  (+) Dental Advisory Given, Teeth Intact   Pulmonary Recent URI  (covid 19)   Pulmonary exam normal        Cardiovascular hypertension, Pt. on medications and Pt. on home beta blockers pulmonary hypertension+CHF  Normal cardiovascular exam  Last TTE with essentially normal BiV and valvular function   Neuro/Psych negative neurological ROS     GI/Hepatic ,GERD  ,,  Endo/Other  diabetes, Type 2Hypothyroidism   Na 130 K 3.3 Ca 7.5   Renal/GU ESRF and DialysisRenal disease     Musculoskeletal  (+) Arthritis ,    Abdominal   Peds  Hematology  (+) Blood dyscrasia, anemia   Anesthesia Other Findings Covid+ 03/06/23 metabolic encephalopathy, improved   Reproductive/Obstetrics                              Anesthesia Physical Anesthesia Plan  ASA: 3  Anesthesia Plan: General   Post-op Pain Management: Tylenol PO (pre-op)*   Induction: Intravenous  PONV Risk Score and Plan: 2 and Ondansetron, Dexamethasone, Treatment may vary due to age or medical condition and Midazolam  Airway Management Planned: Oral ETT  Additional Equipment: None  Intra-op Plan:   Post-operative Plan: Extubation in OR  Informed Consent: I have reviewed the patients History and Physical, chart, labs and discussed the procedure including the risks, benefits and alternatives for the proposed anesthesia with the patient or authorized representative who has indicated his/her understanding and acceptance.     Dental advisory given  Plan Discussed with: CRNA and  Anesthesiologist  Anesthesia Plan Comments:          Anesthesia Quick Evaluation

## 2023-03-19 NOTE — Progress Notes (Signed)
PROGRESS NOTE    [name] [name]  BJY:[id number] DOB: 06/04/1961 DOA: 03/06/2023 PCP: Farris Has, MD   Brief Narrative: 61 year old with past medical history significant for ESRD on hemodialysis MWF, hypertension, diabetes type 2, pulmonary hypertension, cardiomyopathy, GERD, dyslipidemia, anemia of CKD presents to the ED with worsening mental status.  He was admitted with acute metabolic encephalopathy secondary to UTI and COVID-pneumonia and also missed hemodialysis sections.  He was found to have MRSA and enterococcal bacteremia. He was started on Vancomycin.  ID has been following.  Patient was transferred to [facility] for fistula removal.  Vascular surgery was notified.     Assessment & Plan:   Principal Problem:   Acute metabolic encephalopathy Active Problems:   Essential hypertension   Neuropathy   Anemia of chronic disease   Hypokalemia   Hypothyroidism   GERD (gastroesophageal reflux disease)   ESRD (end stage renal disease) (HCC)   Protein-calorie malnutrition, severe  1-MRSA and Enterococcus bacteremia -No significant findings on ultrasound of fistula or CT maxillofacial  -ID following -Concern  for infection in the AV graft.  Underwent  excision of graft  10/2 -Echocardiogram raise concern for endocarditis -TEE is being pursued -Due to COVID cardiology cannot do this procedure until later this week.  Tentatively scheduled 10/4 TEE couldn't be done because patient was taken to HD> cardiology will try to reschedule TEE for Monday.    COVID infection Completed course of Remdesivir Completed steroid.  Continue isolation.  He tested +9/23.   Off isolation since 10/03   ESRD on hemodialysis MWF Managed per nephrology Underwent excision of AV fistula due to infection.  Plan to placed tunneled HD tomorrow. Will need to coordinate with TEE  Acute metabolic encephalopathy In the setting of UTI/COVID, missed hemodialysis and bacteremia Likely has some cognitive  impairment at baseline Alert, less confuse.    Anemia of chronic kidney disease Stable  Hypertension Continue with metoprolol and amlodipine.   Diabetes type 2: Continue with sliding scale insulin  Hypothyroidism Continue with Synthroid  GERD: Continue  with PPI   Thrombocytopenia.  Improving.    Hypoalbuminemia: On supplements       Nutrition Problem: Severe Malnutrition Etiology: chronic illness    Signs/Symptoms: severe muscle depletion, severe fat depletion    Interventions: Nepro shake  Estimated body mass index is 19.26 kg/m as calculated from the following:   Height as of this encounter: 5\' 10"  (1.778 m).   Weight as of this encounter: 60.9 kg.   DVT prophylaxis: SCDs Code Status: Full code Family Communication: Mother updated over phone 10/03 Disposition Plan:  Status is: Inpatient Remains inpatient appropriate because: Management of COVID, bacteremia.     Consultants:  Nephrology ID Vascular-Dr. Lenell Antu Procedures:    Antimicrobials:    Subjective: Alert, calm, appears less confuse.  Denies pain    Objective: Vitals:   03/18/23 1618 03/18/23 2004 03/19/23 0521 03/19/23 0936  BP: (!) 154/78 (!) 199/95 (!) 186/88 (!) 162/90  Pulse: 71 78 (!) 59 64  Resp: 18 18  18   Temp: 98.2 F (36.8 C) 98 F (36.7 C) 98 F (36.7 C) 98 F (36.7 C)  TempSrc: Oral Oral    SpO2: 99% 100% 100%   Weight:      Height:        Intake/Output Summary (Last 24 hours) at 03/19/2023 1435 Last data filed at 03/19/2023 1227 Gross per 24 hour  Intake 480 ml  Output 1 ml  Net 479 ml   [employer]  03/10/23 1330 03/17/23 1253 03/17/23 1621  Weight: 62 kg 62.9 kg 60.9 kg    Examination:  General exam: NAD Respiratory system: CTA Cardiovascular system: S 1, S 2 RRR Gastrointestinal system: BS present, soft, nt Central nervous system: Alert, follows command   Data Reviewed: I have personally reviewed following labs and imaging  studies  CBC: Recent Labs  Lab 03/13/23 0555 03/15/23 0420 03/16/23 0546  WBC 5.6 4.9 6.2  HGB 9.9* 9.0* 11.7*  HCT 31.2* 28.1* 35.9*  MCV 90.2 88.1 89.8  PLT 99* 94* 127*   Basic Metabolic Panel: Recent Labs  Lab 03/13/23 0555 03/15/23 0420 03/16/23 0546  NA 135 137 134*  K 3.0* 3.4* 4.3  CL 101 100 96*  CO2 24 26 20*  GLUCOSE 86 99 149*  BUN 29* 17 27*  CREATININE 5.68* 4.06* 4.87*  CALCIUM 7.0* 7.1* 7.1*  PHOS  --  2.1* 4.4   GFR: Estimated Creatinine Clearance: 13.2 mL/min (A) (by C-G formula based on SCr of 4.87 mg/dL (H)). Liver Function Tests: Recent Labs  Lab 03/14/23 1152 03/15/23 0420 03/16/23 0546  AST 33  --   --   ALT 17  --   --   ALKPHOS 167*  --   --   BILITOT 0.9  --   --   PROT 6.5  --   --   ALBUMIN 2.1* 1.7* 1.8*   No results for input(s): "LIPASE", "AMYLASE" in the last 168 hours. No results for input(s): "AMMONIA" in the last 168 hours. Coagulation Profile: No results for input(s): "INR", "PROTIME" in the last 168 hours. Cardiac Enzymes: No results for input(s): "CKTOTAL", "CKMB", "CKMBINDEX", "TROPONINI" in the last 168 hours. BNP (last 3 results) No results for input(s): "PROBNP" in the last 8760 hours. HbA1C: No results for input(s): "HGBA1C" in the last 72 hours. CBG: Recent Labs  Lab 03/18/23 1137 03/18/23 1618 03/18/23 2006 03/19/23 0706 03/19/23 1115  GLUCAP 106* 118* 129* 93 126*   Lipid Profile: No results for input(s): "CHOL", "HDL", "LDLCALC", "TRIG", "CHOLHDL", "LDLDIRECT" in the last 72 hours. Thyroid Function Tests: No results for input(s): "TSH", "T4TOTAL", "FREET4", "T3FREE", "THYROIDAB" in the last 72 hours. Anemia Panel: No results for input(s): "VITAMINB12", "FOLATE", "FERRITIN", "TIBC", "IRON", "RETICCTPCT" in the last 72 hours. Sepsis Labs: No results for input(s): "PROCALCITON", "LATICACIDVEN" in the last 168 hours.  Recent Results (from the past 240 hour(s))  Culture, blood (Routine X 2) w Reflex  to ID Panel     Status: None   Collection Time: 03/09/23  3:54 PM   Specimen: BLOOD RIGHT ARM  Result Value Ref Range Status   Specimen Description BLOOD RIGHT ARM  Final   Special Requests   Final    BOTTLES DRAWN AEROBIC ONLY Blood Culture results may not be optimal due to an excessive volume of blood received in culture bottles   Culture   Final    NO GROWTH 5 DAYS Performed at [facility] Lab, [address] [place], [place] [address]    Report Status 03/14/2023 FINAL  Final  Culture, blood (Routine X 2) w Reflex to ID Panel     Status: None   Collection Time: 03/09/23  3:54 PM   Specimen: BLOOD RIGHT ARM  Result Value Ref Range Status   Specimen Description BLOOD RIGHT ARM  Final   Special Requests   Final    BOTTLES DRAWN AEROBIC AND ANAEROBIC Blood Culture adequate volume   Culture   Final    NO  GROWTH 5 DAYS Performed at [facility], [address] [place], [place] [address]    Report Status 03/14/2023 FINAL  Final  Aerobic/Anaerobic Culture w Gram Stain (surgical/deep wound)     Status: None (Preliminary result)   Collection Time: 03/15/23  4:02 PM   Specimen: Hemodialysis Graft; Blood  Result Value Ref Range Status   Specimen Description HEMODIALYSIS GRAFT LEFT ARM  Final   Special Requests NONE  Final   Gram Stain   Final    RARE WBC PRESENT, PREDOMINANTLY PMN NO ORGANISMS SEEN    Culture   Final    NO GROWTH 4 DAYS NO ANAEROBES ISOLATED; CULTURE IN PROGRESS FOR 5 DAYS Performed at [facility] Lab, [address], [place], [place] [address]    Report Status PENDING  Incomplete         Radiology Studies: No results found.      Scheduled Meds:  amLODipine  10 mg Oral q morning   atorvastatin  20 mg Oral q morning   Chlorhexidine Gluconate Cloth  6 each Topical Q0600   Chlorhexidine Gluconate Cloth  6 each Topical Q0600   darbepoetin (ARANESP) injection - DIALYSIS  60 mcg Subcutaneous Q Thu-1800   feeding supplement (NEPRO CARB STEADY)   237 mL Oral TID BM   influenza vac split trivalent PF  0.5 mL Intramuscular Tomorrow-1000   insulin aspart  0-9 Units Subcutaneous TID WC   levothyroxine  25 mcg Oral QAC breakfast   liver oil-zinc oxide   Topical Daily   metoprolol tartrate  50 mg Oral BID   multivitamin with minerals  1 tablet Oral Daily   mupirocin ointment   Nasal BID   pantoprazole  40 mg Oral Daily   sodium chloride flush  10-40 mL Intracatheter Q12H   sodium chloride flush  3 mL Intravenous Q12H   Continuous Infusions:  sodium chloride     vancomycin Stopped (03/17/23 1606)     LOS: 13 days    Time spent: 35 minutes    Melesio Madara A Osamah Schmader, MD [facility]   If 7PM-7AM, please contact night-coverage [email or web address]  03/19/2023, 2:35 PM

## 2023-03-19 NOTE — Progress Notes (Signed)
[facility] Progress Note   Subjective:   Pt alert, answering yes/no questions. He reports he does not have much of an appetite. Denies SOB, CP, dizziness, nausea.   Objective Vitals:   03/18/23 1618 03/18/23 2004 03/19/23 0521 03/19/23 0936  BP: (!) 154/78 (!) 199/95 (!) 186/88 (!) 162/90  Pulse: 71 78 (!) 59 64  Resp: 18 18  18   Temp: 98.2 F (36.8 C) 98 F (36.7 C) 98 F (36.7 C) 98 F (36.7 C)  TempSrc: Oral Oral    SpO2: 99% 100% 100%   Weight:      Height:       Physical Exam General: Alert, calm, in NAD Heart: RRR, no murmurs, rubs or gallops Lungs: CTA bilaterally, respirations unlabored on RA Abdomen: Soft, non-distended, +BS Extremities: No edema b/l lower extremities Dialysis Access: temp R internal jugular catheter  Additional Objective Labs: Basic Metabolic Panel: Recent Labs  Lab 03/13/23 0555 03/15/23 0420 03/16/23 0546  NA 135 137 134*  K 3.0* 3.4* 4.3  CL 101 100 96*  CO2 24 26 20*  GLUCOSE 86 99 149*  BUN 29* 17 27*  CREATININE 5.68* 4.06* 4.87*  CALCIUM 7.0* 7.1* 7.1*  PHOS  --  2.1* 4.4   Liver Function Tests: Recent Labs  Lab 03/14/23 1152 03/15/23 0420 03/16/23 0546  AST 33  --   --   ALT 17  --   --   ALKPHOS 167*  --   --   BILITOT 0.9  --   --   PROT 6.5  --   --   ALBUMIN 2.1* 1.7* 1.8*   No results for input(s): "LIPASE", "AMYLASE" in the last 168 hours. CBC: Recent Labs  Lab 03/13/23 0555 03/15/23 0420 03/16/23 0546  WBC 5.6 4.9 6.2  HGB 9.9* 9.0* 11.7*  HCT 31.2* 28.1* 35.9*  MCV 90.2 88.1 89.8  PLT 99* 94* 127*   Blood Culture    Component Value Date/Time   SDES HEMODIALYSIS GRAFT LEFT ARM 03/15/2023 1602   SPECREQUEST NONE 03/15/2023 1602   CULT  03/15/2023 1602    NO GROWTH 3 DAYS NO ANAEROBES ISOLATED; CULTURE IN PROGRESS FOR 5 DAYS Performed at [facility] Lab, [address] [place], [place] [address]    REPTSTATUS PENDING 03/15/2023 1602    Cardiac Enzymes: No results for  input(s): "CKTOTAL", "CKMB", "CKMBINDEX", "TROPONINI" in the last 168 hours. CBG: Recent Labs  Lab 03/18/23 0729 03/18/23 1137 03/18/23 1618 03/18/23 2006 03/19/23 0706  GLUCAP 87 106* 118* 129* 93   Iron Studies: No results for input(s): "IRON", "TIBC", "TRANSFERRIN", "FERRITIN" in the last 72 hours. @lablastinr3 @ Studies/Results: No results found. Medications:  sodium chloride     vancomycin Stopped (03/17/23 1606)    amLODipine  10 mg Oral q morning   atorvastatin  20 mg Oral q morning   Chlorhexidine Gluconate Cloth  6 each Topical Q0600   darbepoetin (ARANESP) injection - DIALYSIS  60 mcg Subcutaneous Q Thu-1800   feeding supplement (NEPRO CARB STEADY)  237 mL Oral TID BM   influenza vac split trivalent PF  0.5 mL Intramuscular Tomorrow-1000   insulin aspart  0-9 Units Subcutaneous TID WC   levothyroxine  25 mcg Oral QAC breakfast   liver oil-zinc oxide   Topical Daily   metoprolol tartrate  50 mg Oral BID   multivitamin with minerals  1 tablet Oral Daily   mupirocin ointment   Nasal BID   pantoprazole  40 mg Oral Daily  sodium chloride flush  10-40 mL Intracatheter Q12H   sodium chloride flush  3 mL Intravenous Q12H    Outpatient Dialysis Orders: [facility] MWF. 4 hours. EDW 64 kg. LUE AVG 15-gauge. Flow rates: 400/autoflow 1.5. 3K/2.5 calcium. No heparin. Mircera 75 mcg every 2 weeks (last dose 9/11). Venofer 50 mg q. weekly.     Assessment/Plan: 1.  MRSA and E faecalis bacteremia - ID following. On vancomycin. TEE planned for 10/4.  Concern for seeding of AVG, VVS removed with no obvious infection surround it. Temporary cath placed.  Plan for [facility]  once LUE heals/infection improves. TEE Monday. Hopefully can replace TDC early next week ESRD- HD MWF. Next HD Monday.  COVID-19 PNA - received remdesivir.  Out of isolation.  Hypokalemia - chronic issue, liberalize diet. Improved. Using increased K bath.  Altered mental status - waxing and wanning.  Possible AVG  infection- removed on 10/2 w/temp cath placement. See above.  HTN/volume -BP variable,  Hx hypOtension but BP now running high.  Under edw, likely weight loss.  UF as tolerated. Plan for lower dry weight on d/c.  Continue metoprolol and amlodipine, consider increasing amlodipine if BP remains elevated Anemia of CKD -Hgb 11.7, hold ESA for now.  Has not been given during this admission. Secondary hyperparathyroidism -placed on binders, corrected calcium and phosphorus at goal Nutrition - Carb modified diet w/Fluid restrictions.  DMT2 - per PMD Debility - chronic issue.  Would benefit from SNF placement but previously has refused. Falling at home per caretaker when discussed at outpatient HD. Cared for by 11 year old mother who does her best but can not pick him up when he falls per her own account.     Rogers Blocker, PA-C 03/19/2023, 9:51 AM  [facility] Pager: [phone]

## 2023-03-19 NOTE — Progress Notes (Signed)
[facility] [place] PROGRESS NOTE  ASSESSMENT / PLAN: [name] [name] is a 62 y.o. male with bacteremia, status post left arteriovenous graft excision and placement of a temporary dialysis catheter.  Will plan to place a tunneled dialysis catheter tomorrow in OR.  SUBJECTIVE: No complaints.  OBJECTIVE: BP (!) 162/90 (BP Location: Right Arm)   Pulse 64   Temp 98 F (36.7 C)   Resp 18   Ht 5\' 10"  (1.778 m)   Wt 60.9 kg   SpO2 100%   BMI 19.26 kg/m   Intake/Output Summary (Last 24 hours) at 03/19/2023 1032 Last data filed at 03/19/2023 0900 Gross per 24 hour  Intake 240 ml  Output 1 ml  Net 239 ml    No acute distress Regular rate and rhythm Unlabored breathing Right neck tunneled dialysis catheter in place Left arm incisions clean dry and intact     Latest Ref Rng & Units 03/16/2023    5:46 AM 03/15/2023    4:20 AM 03/13/2023    5:55 AM  CBC  WBC 4.0 - 10.5 K/uL 6.2  4.9  5.6   Hemoglobin 13.0 - 17.0 g/dL 75.6  9.0  9.9   Hematocrit 39.0 - 52.0 % 35.9  28.1  31.2   Platelets 150 - 400 K/uL 127  94  99         Latest Ref Rng & Units 03/16/2023    5:46 AM 03/15/2023    4:20 AM 03/14/2023   11:52 AM  CMP  Glucose 70 - 99 mg/dL 433  99    BUN 8 - 23 mg/dL 27  17    Creatinine 2.95 - 1.24 mg/dL 1.88  4.16    Sodium 606 - 145 mmol/L 134  137    Potassium 3.5 - 5.1 mmol/L 4.3  3.4    Chloride 98 - 111 mmol/L 96  100    CO2 22 - 32 mmol/L 20  26    Calcium 8.9 - 10.3 mg/dL 7.1  7.1    Total Protein 6.5 - 8.1 g/dL   6.5   Total Bilirubin 0.3 - 1.2 mg/dL   0.9   Alkaline Phos 38 - 126 U/L   167   AST 15 - 41 U/L   33   ALT 0 - 44 U/L   17     Estimated Creatinine Clearance: 13.2 mL/min (A) (by C-G formula based on SCr of 4.87 mg/dL (H)).  [name]. Lenell Antu, MD [facility] Phone Number: [phone] 03/19/2023 10:32 AM

## 2023-03-19 NOTE — H&P (View-Only) (Signed)
[facility] [place] PROGRESS NOTE  ASSESSMENT / PLAN: [name] [name] is a 62 y.o. male with bacteremia, status post left arteriovenous graft excision and placement of a temporary dialysis catheter.  Will plan to place a tunneled dialysis catheter tomorrow in OR.  SUBJECTIVE: No complaints.  OBJECTIVE: BP (!) 162/90 (BP Location: Right Arm)   Pulse 64   Temp 98 F (36.7 C)   Resp 18   Ht 5\' 10"  (1.778 m)   Wt 60.9 kg   SpO2 100%   BMI 19.26 kg/m   Intake/Output Summary (Last 24 hours) at 03/19/2023 1032 Last data filed at 03/19/2023 0900 Gross per 24 hour  Intake 240 ml  Output 1 ml  Net 239 ml    No acute distress Regular rate and rhythm Unlabored breathing Right neck tunneled dialysis catheter in place Left arm incisions clean dry and intact     Latest Ref Rng & Units 03/16/2023    5:46 AM 03/15/2023    4:20 AM 03/13/2023    5:55 AM  CBC  WBC 4.0 - 10.5 K/uL 6.2  4.9  5.6   Hemoglobin 13.0 - 17.0 g/dL 75.6  9.0  9.9   Hematocrit 39.0 - 52.0 % 35.9  28.1  31.2   Platelets 150 - 400 K/uL 127  94  99         Latest Ref Rng & Units 03/16/2023    5:46 AM 03/15/2023    4:20 AM 03/14/2023   11:52 AM  CMP  Glucose 70 - 99 mg/dL 433  99    BUN 8 - 23 mg/dL 27  17    Creatinine 2.95 - 1.24 mg/dL 1.88  4.16    Sodium 606 - 145 mmol/L 134  137    Potassium 3.5 - 5.1 mmol/L 4.3  3.4    Chloride 98 - 111 mmol/L 96  100    CO2 22 - 32 mmol/L 20  26    Calcium 8.9 - 10.3 mg/dL 7.1  7.1    Total Protein 6.5 - 8.1 g/dL   6.5   Total Bilirubin 0.3 - 1.2 mg/dL   0.9   Alkaline Phos 38 - 126 U/L   167   AST 15 - 41 U/L   33   ALT 0 - 44 U/L   17     Estimated Creatinine Clearance: 13.2 mL/min (A) (by C-G formula based on SCr of 4.87 mg/dL (H)).  [name]. Lenell Antu, MD [facility] Phone Number: [phone] 03/19/2023 10:32 AM

## 2023-03-19 NOTE — Progress Notes (Signed)
Pt has a RIJ HDC with pigtail;  IV Team has received multiple consults to assess this , pt as the dressing will not stay intact;  the dressing has been changed 3 times in 24 hours; concerned for risk of infection; site is wnl at this time, but again, the dressing continues to either be pulled off, or just comes loose due to the patient's beard.

## 2023-03-20 ENCOUNTER — Encounter (HOSPITAL_COMMUNITY)
Admission: EM | Disposition: A | Discharge status: Skilled Nursing Facility | Payer: Self-pay | Source: Home / Self Care | Attending: Internal Medicine

## 2023-03-20 ENCOUNTER — Encounter (HOSPITAL_COMMUNITY): Payer: Self-pay | Admitting: Internal Medicine

## 2023-03-20 ENCOUNTER — Other Ambulatory Visit: Payer: Self-pay

## 2023-03-20 ENCOUNTER — Telehealth (INDEPENDENT_AMBULATORY_CARE_PROVIDER_SITE_OTHER): Payer: Self-pay | Admitting: Gastroenterology

## 2023-03-20 ENCOUNTER — Inpatient Hospital Stay (HOSPITAL_COMMUNITY): Payer: Medicare HMO | Admitting: Anesthesiology

## 2023-03-20 ENCOUNTER — Inpatient Hospital Stay (HOSPITAL_COMMUNITY): Payer: Medicare HMO

## 2023-03-20 DIAGNOSIS — E1122 Type 2 diabetes mellitus with diabetic chronic kidney disease: Secondary | ICD-10-CM

## 2023-03-20 DIAGNOSIS — N186 End stage renal disease: Secondary | ICD-10-CM | POA: Diagnosis not present

## 2023-03-20 DIAGNOSIS — I132 Hypertensive heart and chronic kidney disease with heart failure and with stage 5 chronic kidney disease, or end stage renal disease: Secondary | ICD-10-CM

## 2023-03-20 DIAGNOSIS — I509 Heart failure, unspecified: Secondary | ICD-10-CM | POA: Diagnosis not present

## 2023-03-20 DIAGNOSIS — G9341 Metabolic encephalopathy: Secondary | ICD-10-CM | POA: Diagnosis not present

## 2023-03-20 DIAGNOSIS — I350 Nonrheumatic aortic (valve) stenosis: Secondary | ICD-10-CM

## 2023-03-20 HISTORY — PX: TEE WITHOUT CARDIOVERSION: SHX5443

## 2023-03-20 LAB — BASIC METABOLIC PANEL
Anion gap: 10 (ref 5–15)
BUN: 21 mg/dL (ref 8–23)
CO2: 26 mmol/L (ref 22–32)
Calcium: 7.4 mg/dL — ABNORMAL LOW (ref 8.9–10.3)
Chloride: 98 mmol/L (ref 98–111)
Creatinine, Ser: 4.52 mg/dL — ABNORMAL HIGH (ref 0.61–1.24)
GFR, Estimated: 14 mL/min — ABNORMAL LOW (ref >60)
Glucose, Bld: 114 mg/dL — ABNORMAL HIGH (ref 70–99)
Potassium: 3.1 mmol/L — ABNORMAL LOW (ref 3.5–5.1)
Sodium: 134 mmol/L — ABNORMAL LOW (ref 135–145)

## 2023-03-20 LAB — AEROBIC/ANAEROBIC CULTURE W GRAM STAIN (SURGICAL/DEEP WOUND): Culture: NO GROWTH

## 2023-03-20 LAB — ECHO TEE
AR max vel: 1.75 cm2
AV Area VTI: 1.72 cm2
AV Area mean vel: 1.83 cm2
AV Mean grad: 3.2 mm[Hg]
AV Peak grad: 5.9 mm[Hg]
Ao pk vel: 1.21 m/s

## 2023-03-20 LAB — CBC
HCT: 26.8 % — ABNORMAL LOW (ref 39.0–52.0)
Hemoglobin: 8.8 g/dL — ABNORMAL LOW (ref 13.0–17.0)
MCH: 29.4 pg (ref 26.0–34.0)
MCHC: 32.8 g/dL (ref 30.0–36.0)
MCV: 89.6 fL (ref 80.0–100.0)
Platelets: 126 10*3/uL — ABNORMAL LOW (ref 150–400)
RBC: 2.99 MIL/uL — ABNORMAL LOW (ref 4.22–5.81)
RDW: 14.5 % (ref 11.5–15.5)
WBC: 5.6 10*3/uL (ref 4.0–10.5)
nRBC: 0 % (ref 0.0–0.2)

## 2023-03-20 LAB — VANCOMYCIN, RANDOM: Vancomycin Rm: 21 ug/mL

## 2023-03-20 LAB — GLUCOSE, CAPILLARY
Glucose-Capillary: 108 mg/dL — ABNORMAL HIGH (ref 70–99)
Glucose-Capillary: 94 mg/dL (ref 70–99)
Glucose-Capillary: 99 mg/dL (ref 70–99)
Glucose-Capillary: 103 mg/dL — ABNORMAL HIGH (ref 70–99)
Glucose-Capillary: 134 mg/dL — ABNORMAL HIGH (ref 70–99)

## 2023-03-20 SURGERY — ECHOCARDIOGRAM, TRANSESOPHAGEAL
Anesthesia: Monitor Anesthesia Care

## 2023-03-20 SURGERY — ECHOCARDIOGRAM, TRANSESOPHAGEAL
Anesthesia: General

## 2023-03-20 MED ORDER — ACETAMINOPHEN 10 MG/ML IV SOLN
INTRAVENOUS | Status: DC | PRN
Start: 2023-03-20 — End: 2023-03-20
  Administered 2023-03-20: 1000 mg via INTRAVENOUS

## 2023-03-20 MED ORDER — DEXAMETHASONE SODIUM PHOSPHATE 10 MG/ML IJ SOLN
INTRAMUSCULAR | Status: DC | PRN
Start: 2023-03-20 — End: 2023-03-20
  Administered 2023-03-20: 10 mg via INTRAVENOUS

## 2023-03-20 MED ORDER — MIDAZOLAM HCL 2 MG/2ML IJ SOLN
INTRAMUSCULAR | Status: AC
  Filled 2023-03-20: qty 2

## 2023-03-20 MED ORDER — INSULIN ASPART 100 UNIT/ML IJ SOLN: 0.0000 [IU] | INTRAMUSCULAR | Status: DC | PRN

## 2023-03-20 MED ORDER — SUGAMMADEX SODIUM 200 MG/2ML IV SOLN
INTRAVENOUS | Status: DC | PRN
  Administered 2023-03-20 (×2): 200 mg via INTRAVENOUS

## 2023-03-20 MED ORDER — LIDOCAINE-EPINEPHRINE (PF) 1 %-1:200000 IJ SOLN
INTRAMUSCULAR | Status: AC
  Filled 2023-03-20: qty 30

## 2023-03-20 MED ORDER — PHENYLEPHRINE HCL-NACL 20-0.9 MG/250ML-% IV SOLN
INTRAVENOUS | Status: DC | PRN
Start: 2023-03-20 — End: 2023-03-20
  Administered 2023-03-20: 40 ug/min via INTRAVENOUS

## 2023-03-20 MED ORDER — OXYCODONE HCL 5 MG/5ML PO SOLN: 5.0000 mg | Freq: Once | ORAL | Status: DC | PRN

## 2023-03-20 MED ORDER — OXYCODONE HCL 5 MG PO TABS: 5.0000 mg | ORAL_TABLET | Freq: Once | ORAL | Status: DC | PRN

## 2023-03-20 MED ORDER — HEPARIN SODIUM (PORCINE) 1000 UNIT/ML IJ SOLN: 2400.0000 [IU] | Freq: Once | INTRAMUSCULAR | Status: DC

## 2023-03-20 MED ORDER — VASOPRESSIN 20 UNIT/ML IV SOLN
INTRAVENOUS | Status: AC
  Filled 2023-03-20: qty 1

## 2023-03-20 MED ORDER — HEPARIN SODIUM (PORCINE) 1000 UNIT/ML IJ SOLN
2800.0000 [IU] | Freq: Once | INTRAMUSCULAR | Status: DC
  Filled 2023-03-20: qty 3

## 2023-03-20 MED ORDER — CHLORHEXIDINE GLUCONATE 0.12 % MT SOLN: 15.0000 mL | Freq: Once | OROMUCOSAL | Status: AC

## 2023-03-20 MED ORDER — PHENYLEPHRINE 80 MCG/ML (10ML) SYRINGE FOR IV PUSH (FOR BLOOD PRESSURE SUPPORT)
PREFILLED_SYRINGE | INTRAVENOUS | Status: DC | PRN
  Administered 2023-03-20 (×2): 80 ug via INTRAVENOUS

## 2023-03-20 MED ORDER — FENTANYL CITRATE (PF) 100 MCG/2ML IJ SOLN: 25.0000 ug | INTRAMUSCULAR | Status: DC | PRN

## 2023-03-20 MED ORDER — LIDOCAINE 2% (20 MG/ML) 5 ML SYRINGE
INTRAMUSCULAR | Status: AC
  Filled 2023-03-20: qty 5

## 2023-03-20 MED ORDER — DEXAMETHASONE SODIUM PHOSPHATE 10 MG/ML IJ SOLN
INTRAMUSCULAR | Status: AC
  Filled 2023-03-20: qty 1

## 2023-03-20 MED ORDER — ONDANSETRON HCL 4 MG/2ML IJ SOLN
INTRAMUSCULAR | Status: DC | PRN
  Administered 2023-03-20: 4 mg via INTRAVENOUS

## 2023-03-20 MED ORDER — FENTANYL CITRATE (PF) 250 MCG/5ML IJ SOLN
INTRAMUSCULAR | Status: DC | PRN
  Administered 2023-03-20 (×2): 50 ug via INTRAVENOUS

## 2023-03-20 MED ORDER — HEPARIN SODIUM (PORCINE) 1000 UNIT/ML IJ SOLN
INTRAMUSCULAR | Status: AC
  Filled 2023-03-20: qty 10

## 2023-03-20 MED ORDER — ACETAMINOPHEN 500 MG PO TABS: 1000.0000 mg | ORAL_TABLET | Freq: Once | ORAL | Status: DC

## 2023-03-20 MED ORDER — PROPOFOL 10 MG/ML IV BOLUS
INTRAVENOUS | Status: DC | PRN
  Administered 2023-03-20: 100 mg via INTRAVENOUS

## 2023-03-20 MED ORDER — SODIUM CHLORIDE 0.9 % IV SOLN: INTRAVENOUS | Status: DC

## 2023-03-20 MED ORDER — PROPOFOL 10 MG/ML IV BOLUS
INTRAVENOUS | Status: AC
  Filled 2023-03-20: qty 20

## 2023-03-20 MED ORDER — ORAL CARE MOUTH RINSE: 15.0000 mL | Freq: Once | OROMUCOSAL | Status: AC

## 2023-03-20 MED ORDER — FENTANYL CITRATE (PF) 250 MCG/5ML IJ SOLN
INTRAMUSCULAR | Status: AC
  Filled 2023-03-20: qty 5

## 2023-03-20 MED ORDER — ONDANSETRON HCL 4 MG/2ML IJ SOLN
INTRAMUSCULAR | Status: AC
  Filled 2023-03-20: qty 2

## 2023-03-20 MED ORDER — ACETAMINOPHEN 10 MG/ML IV SOLN
INTRAVENOUS | Status: AC
  Filled 2023-03-20: qty 100

## 2023-03-20 MED ORDER — ROCURONIUM BROMIDE 10 MG/ML (PF) SYRINGE
PREFILLED_SYRINGE | INTRAVENOUS | Status: DC | PRN
  Administered 2023-03-20: 50 mg via INTRAVENOUS

## 2023-03-20 MED ORDER — CHLORHEXIDINE GLUCONATE 0.12 % MT SOLN
OROMUCOSAL | Status: AC
  Administered 2023-03-20: 15 mL via OROMUCOSAL
  Filled 2023-03-20: qty 15

## 2023-03-20 MED ORDER — LIDOCAINE 2% (20 MG/ML) 5 ML SYRINGE
INTRAMUSCULAR | Status: DC | PRN
  Administered 2023-03-20: 60 mg via INTRAVENOUS

## 2023-03-20 MED ORDER — HEPARIN 6000 UNIT IRRIGATION SOLUTION
Status: AC
  Filled 2023-03-20: qty 500

## 2023-03-20 MED ORDER — ONDANSETRON HCL 4 MG/2ML IJ SOLN: 4.0000 mg | Freq: Once | INTRAMUSCULAR | Status: DC | PRN

## 2023-03-20 SURGICAL SUPPLY — 41 items
APL SKNCLS STERI-STRIP NONHPOA (GAUZE/BANDAGES/DRESSINGS)
BAG DECANTER FOR FLEXI CONT (MISCELLANEOUS) ×1 IMPLANT
BENZOIN TINCTURE PRP APPL 2/3 (GAUZE/BANDAGES/DRESSINGS) ×1 IMPLANT
BIOPATCH RED 1 DISK 7.0 (GAUZE/BANDAGES/DRESSINGS) ×1 IMPLANT
CATH PALINDROME-P 19CM W/VT (CATHETERS) IMPLANT
CATH PALINDROME-P 23CM W/VT (CATHETERS) IMPLANT
CATH PALINDROME-P 28CM W/VT (CATHETERS) IMPLANT
COVER PROBE W GEL 5X96 (DRAPES) ×1 IMPLANT
COVER SURGICAL LIGHT HANDLE (MISCELLANEOUS) ×1 IMPLANT
DRAPE C-ARM 42X72 X-RAY (DRAPES) ×1 IMPLANT
DRAPE CHEST BREAST 15X10 FENES (DRAPES) ×1 IMPLANT
GAUZE 4X4 16PLY ~~LOC~~+RFID DBL (SPONGE) ×1 IMPLANT
GAUZE SPONGE 4X4 12PLY STRL (GAUZE/BANDAGES/DRESSINGS) ×1 IMPLANT
GLOVE BIO SURGEON STRL SZ8 (GLOVE) ×1 IMPLANT
GOWN STRL REUS W/ TWL LRG LVL3 (GOWN DISPOSABLE) ×1 IMPLANT
GOWN STRL REUS W/ TWL XL LVL3 (GOWN DISPOSABLE) ×1 IMPLANT
GOWN STRL REUS W/TWL LRG LVL3 (GOWN DISPOSABLE) ×1
GOWN STRL REUS W/TWL XL LVL3 (GOWN DISPOSABLE) ×1
KIT BASIN OR (CUSTOM PROCEDURE TRAY) ×1 IMPLANT
KIT PALINDROME-P 55CM (CATHETERS) IMPLANT
KIT TURNOVER KIT B (KITS) ×1 IMPLANT
NDL 18GX1X1/2 (RX/OR ONLY) (NEEDLE) ×1 IMPLANT
NDL HYPO 25GX1X1/2 BEV (NEEDLE) ×1 IMPLANT
NEEDLE 18GX1X1/2 (RX/OR ONLY) (NEEDLE) ×1 IMPLANT
NEEDLE HYPO 25GX1X1/2 BEV (NEEDLE) ×1 IMPLANT
NS IRRIG 1000ML POUR BTL (IV SOLUTION) ×1 IMPLANT
PACK BASIC III (CUSTOM PROCEDURE TRAY) ×1
PACK SRG BSC III STRL LF ECLPS (CUSTOM PROCEDURE TRAY) ×1 IMPLANT
PAD ARMBOARD 7.5X6 YLW CONV (MISCELLANEOUS) ×2 IMPLANT
SET MICROPUNCTURE 5F STIFF (MISCELLANEOUS) ×1 IMPLANT
SOAP 2 % CHG 4 OZ (WOUND CARE) ×1 IMPLANT
STRIP CLOSURE SKIN 1/2X4 (GAUZE/BANDAGES/DRESSINGS) ×1 IMPLANT
SUT ETHILON 3 0 PS 1 (SUTURE) ×1 IMPLANT
SUT MNCRL AB 4-0 PS2 18 (SUTURE) ×1 IMPLANT
SYR 10ML LL (SYRINGE) ×1 IMPLANT
SYR 20ML LL LF (SYRINGE) ×2 IMPLANT
SYR 5ML LL (SYRINGE) ×1 IMPLANT
SYR CONTROL 10ML LL (SYRINGE) ×1 IMPLANT
TOWEL GREEN STERILE (TOWEL DISPOSABLE) ×1 IMPLANT
TOWEL GREEN STERILE FF (TOWEL DISPOSABLE) ×2 IMPLANT
WATER STERILE IRR 1000ML POUR (IV SOLUTION) ×1 IMPLANT

## 2023-03-20 NOTE — TOC Progression Note (Addendum)
Transition of Care [facility]) - Progression Note    Patient Details  Name: [name] [name] MRN: [id number] Date of Birth: [birth year]  Transition of Care [facility]) CM/SW Contact  Erin Sons, [place] Phone Number: 03/20/2023, 2:42 PM  Clinical Narrative:     CSW called pt's mother to discuss SNF bed offers. She states that pt has been telling her that he is going home instead of SNF. Mother is willing to review SNF bed offers. She Is currently bedside and CSW agrees to meet in the room.   CSW met with pt and pt' smother in the room. Provided SNF bed offers Mother chooses [facility] as she has a family member who is currently there. She repeatedly expressess confusion about pt's medical state and is wanting a medical update. CSW notified attending via secure chat. Pt speaks to pt about going to SNF as well. Pt nods yes that he is agreeable to go to SNF for rehab at DC.   CSW contacted [facility] liaison to confirm bed offer and that they can transport to [facility] location. [facility] Confirmed they could transport to that location.   Expected Discharge Plan: Skilled Nursing Facility Barriers to Discharge: Continued Medical Work up  Expected Discharge Plan and Services In-house Referral: Clinical Social Work     Living arrangements for the past 2 months: Single Family Home                                       Social Determinants of Health (SDOH) Interventions SDOH Screenings   Food Insecurity: No Food Insecurity (03/06/2023)  Housing: Low Risk  (03/06/2023)  Transportation Needs: No Transportation Needs (03/06/2023)  Utilities: Not At Risk (03/06/2023)  Depression (PHQ2-9): Low Risk  (09/18/2018)  Tobacco Use: Low Risk  (03/20/2023)    Readmission Risk Interventions    03/07/2023    1:31 PM  Readmission Risk Prevention Plan  Transportation Screening Complete  HRI or Home Care Consult Complete  Palliative Care Screening Not Applicable  Medication Review (RN Care  Manager) Complete

## 2023-03-20 NOTE — Telephone Encounter (Signed)
Pt wife left voicemail stating that pt is still admitted and will not be able to make appt on 03/23/23 for TCS

## 2023-03-20 NOTE — Progress Notes (Signed)
Secure chat sent to Darryl RN requesting a consult be placed when patient back from HD for HD catheter to be pulled. Tomasita Morrow RN VAST

## 2023-03-20 NOTE — Interval H&P Note (Signed)
History and Physical Interval Note:  03/20/2023 7:30 AM  [name] [name]  has presented today for surgery, with the diagnosis of ESRD.  The various methods of treatment have been discussed with the patient and family. After consideration of risks, benefits and other options for treatment, the patient has consented to  Procedure(s): INSERTION OF TUNNELED DIALYSIS CATHETER (N/A) as a surgical intervention.  The patient's history has been reviewed, patient examined, no change in status, stable for surgery.  I have reviewed the patient's chart and labs.  Questions were answered to the patient's satisfaction.     [name]

## 2023-03-20 NOTE — OR Nursing (Signed)
TEE Probe # [id number] used

## 2023-03-20 NOTE — CV Procedure (Signed)
TRANSESOPHAGEAL ECHOCARDIOGRAM   NAME:  [name] [name]    MRN: [id number] DOB:  Mar 30, 1961    ADMIT DATE: 03/06/2023  INDICATIONS: Bacteremia   PROCEDURE:   Informed consent was obtained prior to the procedure. The risks, benefits and alternatives for the procedure were discussed and the patient comprehended these risks.  Risks include, but are not limited to, cough, sore throat, vomiting, nausea, somnolence, esophageal and stomach trauma or perforation, bleeding, low blood pressure, aspiration, pneumonia, infection, trauma to the teeth and death.    Procedural time out performed. The oropharynx was anesthetized with topical 1% benzocaine.    Anesthesia was administered by Dr. Mal Amabile.  The patient was administered general anesthesia for the procedure.  The patient's heart rate, blood pressure, and oxygen saturation are monitored continuously during the procedure. The period of conscious sedation is 21 minutes, of which I was present face-to-face 100% of this time.   The transesophageal probe was inserted in the esophagus and stomach without difficulty and multiple views were obtained.   COMPLICATIONS:    There were no immediate complications.  KEY FINDINGS:  Small linear 2.3 mm mobile vegetation attached to the lateral commissure of the P1 segment of the mitral valve consistent valve vegetation.  Dialysis catheter tip in the right atrium with 6.4 mm x 2.8 mm mobile mass consistent with vegetation of the dialysis catheter. Normal LV and RV function. Mild aortic stenosis.  No vegetation seen on the aortic valve. Full report to follow. Further management per primary team.   Gerri Spore T. Flora Lipps, MD, [facility]  [facility]  [address], [address] [place], [place] [address] [phone]  3:30 PM

## 2023-03-20 NOTE — Progress Notes (Addendum)
PROGRESS NOTE    [name] [name]  PIR:[id number] DOB: [birth year] DOA: 03/06/2023 PCP: Farris Has, MD   Brief Narrative: 62 year old with past medical history significant for ESRD on hemodialysis MWF, hypertension, diabetes type 2, pulmonary hypertension, cardiomyopathy, GERD, dyslipidemia, anemia of CKD presents to the ED with worsening mental status.  He was admitted with acute metabolic encephalopathy secondary to UTI and COVID-pneumonia and also missed hemodialysis sections.  He was found to have MRSA and enterococcal bacteremia. He was started on Vancomycin.  ID has been following.  Patient was transferred to [facility] for fistula removal.  Vascular surgery was notified.     Assessment & Plan:   Principal Problem:   Acute metabolic encephalopathy Active Problems:   Essential hypertension   Neuropathy   Anemia of chronic disease   Hypokalemia   Hypothyroidism   GERD (gastroesophageal reflux disease)   ESRD (end stage renal disease) (HCC)   Protein-calorie malnutrition, severe  1-MRSA and Enterococcus bacteremia -No significant findings on ultrasound of fistula or CT maxillofacial  -ID following -Concern  for infection in the AV graft.  Underwent  excision of graft  10/2 -Echocardiogram raise concern for endocarditis -TEE is being pursued -Due to COVID cardiology cannot do this procedure until later this week.  Tentatively scheduled 10/4 TEE: results pending, Notes from Vascular report patient was found to have vegetation mitral valve and in the temporary catheter. Patient will need temporary catheter remove and holiday line.    COVID infection Completed course of Remdesivir Completed steroid.  Continue isolation.  He tested +9/23.   Off isolation since 10/03   ESRD on hemodialysis MWF Managed per nephrology Underwent excision of AV fistula due to infection.  Temporary catheter with vegetation. Will need to be removed. He will need holiday line.   Acute metabolic  encephalopathy In the setting of UTI/COVID, missed hemodialysis and bacteremia Likely has some cognitive impairment at baseline Alert, less confuse.    Anemia of chronic kidney disease Stable  Hypertension Continue with metoprolol and amlodipine. Might need to add hydralazine. Will follow BP after HD  Diabetes type 2: Continue with sliding scale insulin  Hypothyroidism Continue with Synthroid  GERD: Continue  with PPI   Thrombocytopenia.  Improving.    Hypoalbuminemia: On supplements       Nutrition Problem: Severe Malnutrition Etiology: chronic illness    Signs/Symptoms: severe muscle depletion, severe fat depletion    Interventions: Nepro shake  Estimated body mass index is 19.26 kg/m as calculated from the following:   Height as of this encounter: 5\' 10"  (1.778 m).   Weight as of this encounter: 60.9 kg.   DVT prophylaxis: SCDs Code Status: Full code Family Communication: Mother updated at bedside 10/07 Disposition Plan:  Status is: Inpatient Remains inpatient appropriate because: Management of COVID, bacteremia.     Consultants:  Nephrology ID Vascular-Dr. Lenell Antu Procedures:    Antimicrobials:    Subjective: He is alert, denies pain. No new complaints.    Objective: Vitals:   03/20/23 0915 03/20/23 0930 03/20/23 0945 03/20/23 1014  BP: (!) 163/77 (!) 188/86 (!) 160/82 (!) 171/82  Pulse: (!) 57 60 60 68  Resp: 14 14 13 18   Temp:  97.7 F (36.5 C)  97.9 F (36.6 C)  TempSrc:    Oral  SpO2: 99% 100% 99% 100%  Weight:      Height:        Intake/Output Summary (Last 24 hours) at 03/20/2023 1348 Last data filed at 03/20/2023 0853 Gross per 24  hour  Intake 200 ml  Output 1 ml  Net 199 ml   Filed Weights   03/10/23 1330 03/17/23 1253 03/17/23 1621  Weight: 62 kg 62.9 kg 60.9 kg    Examination:  General exam: NAD Respiratory system: CTA Cardiovascular system: S 1, S 2 RRR Gastrointestinal system: BS present, soft,  nt Central nervous system: Alert, follows command   Data Reviewed: I have personally reviewed following labs and imaging studies  CBC: Recent Labs  Lab 03/15/23 0420 03/16/23 0546 03/19/23 1450 03/20/23 0534  WBC 4.9 6.2 7.5 5.6  HGB 9.0* 11.7* 10.1* 8.8*  HCT 28.1* 35.9* 30.7* 26.8*  MCV 88.1 89.8 88.5 89.6  PLT 94* 127* 151 126*   Basic Metabolic Panel: Recent Labs  Lab 03/15/23 0420 03/16/23 0546 03/19/23 1450 03/20/23 0534  NA 137 134* 130* 134*  K 3.4* 4.3 3.3* 3.1*  CL 100 96* 95* 98  CO2 26 20* 24 26  GLUCOSE 99 149* 171* 114*  BUN 17 27* 21 21  CREATININE 4.06* 4.87* 4.17* 4.52*  CALCIUM 7.1* 7.1* 7.5* 7.4*  PHOS 2.1* 4.4  --   --    GFR: Estimated Creatinine Clearance: 14.2 mL/min (A) (by C-G formula based on SCr of 4.52 mg/dL (H)). Liver Function Tests: Recent Labs  Lab 03/14/23 1152 03/15/23 0420 03/16/23 0546  AST 33  --   --   ALT 17  --   --   ALKPHOS 167*  --   --   BILITOT 0.9  --   --   PROT 6.5  --   --   ALBUMIN 2.1* 1.7* 1.8*   No results for input(s): "LIPASE", "AMYLASE" in the last 168 hours. No results for input(s): "AMMONIA" in the last 168 hours. Coagulation Profile: No results for input(s): "INR", "PROTIME" in the last 168 hours. Cardiac Enzymes: No results for input(s): "CKTOTAL", "CKMB", "CKMBINDEX", "TROPONINI" in the last 168 hours. BNP (last 3 results) No results for input(s): "PROBNP" in the last 8760 hours. HbA1C: No results for input(s): "HGBA1C" in the last 72 hours. CBG: Recent Labs  Lab 03/19/23 2136 03/20/23 0652 03/20/23 0838 03/20/23 0856 03/20/23 1117  GLUCAP 129* 108* 99 94 103*   Lipid Profile: No results for input(s): "CHOL", "HDL", "LDLCALC", "TRIG", "CHOLHDL", "LDLDIRECT" in the last 72 hours. Thyroid Function Tests: No results for input(s): "TSH", "T4TOTAL", "FREET4", "T3FREE", "THYROIDAB" in the last 72 hours. Anemia Panel: No results for input(s): "VITAMINB12", "FOLATE", "FERRITIN", "TIBC",  "IRON", "RETICCTPCT" in the last 72 hours. Sepsis Labs: No results for input(s): "PROCALCITON", "LATICACIDVEN" in the last 168 hours.  Recent Results (from the past 240 hour(s))  Aerobic/Anaerobic Culture w Gram Stain (surgical/deep wound)     Status: None   Collection Time: 03/15/23  4:02 PM   Specimen: Hemodialysis Graft; Blood  Result Value Ref Range Status   Specimen Description HEMODIALYSIS GRAFT LEFT ARM  Final   Special Requests NONE  Final   Gram Stain   Final    RARE WBC PRESENT, PREDOMINANTLY PMN NO ORGANISMS SEEN    Culture   Final    No growth aerobically or anaerobically. Performed at [facility], [address] [place], [place] [address]    Report Status 03/20/2023 FINAL  Final         Radiology Studies: DG Chest Port 1 View  Result Date: 03/20/2023 CLINICAL DATA:  Status post dialysis catheter insertion. EXAM: PORTABLE CHEST 1 VIEW COMPARISON:  03/15/2023 FINDINGS: There is a right sided dialysis catheter  with tips in the right atrium. No pneumothorax identified. The heart size and mediastinal contours are unremarkable. Lung volumes are low. Asymmetric elevation of right hemidiaphragm. No superimposed pleural effusion, interstitial edema or airspace disease. IMPRESSION: Right-sided dialysis catheter with tips in the right atrium. No pneumothorax. Electronically Signed   By: Signa Kell M.D.   On: 03/20/2023 13:00   EP STUDY  Result Date: 03/20/2023 See surgical note for result.       Scheduled Meds:  amLODipine  10 mg Oral q morning   atorvastatin  20 mg Oral q morning   Chlorhexidine Gluconate Cloth  6 each Topical Q0600   Chlorhexidine Gluconate Cloth  6 each Topical Q0600   darbepoetin (ARANESP) injection - DIALYSIS  60 mcg Subcutaneous Q Thu-1800   feeding supplement (NEPRO CARB STEADY)  237 mL Oral TID BM   influenza vac split trivalent PF  0.5 mL Intramuscular Tomorrow-1000   insulin aspart  0-9 Units Subcutaneous TID WC   levothyroxine   25 mcg Oral QAC breakfast   liver oil-zinc oxide   Topical Daily   metoprolol tartrate  50 mg Oral BID   multivitamin with minerals  1 tablet Oral Daily   mupirocin ointment   Nasal BID   pantoprazole  40 mg Oral Daily   polyethylene glycol  17 g Oral Daily   sodium chloride flush  10-40 mL Intracatheter Q12H   sodium chloride flush  3 mL Intravenous Q12H   sorbitol  30 mL Oral Once   Continuous Infusions:  sodium chloride     vancomycin Stopped (03/17/23 1606)     LOS: 14 days    Time spent: 35 minutes    Gerri Acre A Kemet Nijjar, MD [facility]   If 7PM-7AM, please contact night-coverage [email or web address]  03/20/2023, 1:48 PM

## 2023-03-20 NOTE — Anesthesia Postprocedure Evaluation (Signed)
Anesthesia Post Note  Patient: [name] [name]  Procedure(s) Performed: TRANSESOPHAGEAL ECHOCARDIOGRAM     Patient location during evaluation: PACU Anesthesia Type: General Level of consciousness: awake and alert Pain management: pain level controlled Vital Signs Assessment: post-procedure vital signs reviewed and stable Respiratory status: spontaneous breathing, nonlabored ventilation, respiratory function stable and patient connected to nasal cannula oxygen Cardiovascular status: stable and blood pressure returned to baseline Anesthetic complications: no   No notable events documented.  Last Vitals:  Vitals:   03/20/23 0945 03/20/23 1014  BP: (!) 160/82 (!) 171/82  Pulse: 60 68  Resp: 13 18  Temp:  36.6 C  SpO2: 99% 100%    Last Pain:  Vitals:   03/20/23 1014  TempSrc: Oral  PainSc:                  [name]

## 2023-03-20 NOTE — Progress Notes (Signed)
[facility] Progress Note   Subjective:   Patient seen and examined at bedside.  Only complaint is hunger.  Denies CP, SOB, abdominal pain, n/v/d, and chills.  TEE this AM with vegetation on mitral valve and temp catheter.  Requires line holiday. Today patient and mother agreed to SNF placement on discharge with SW.   Objective Vitals:   03/20/23 0915 03/20/23 0930 03/20/23 0945 03/20/23 1014  BP: (!) 163/77 (!) 188/86 (!) 160/82 (!) 171/82  Pulse: (!) 57 60 60 68  Resp: 14 14 13 18   Temp:  97.7 F (36.5 C)  97.9 F (36.6 C)  TempSrc:    Oral  SpO2: 99% 100% 99% 100%  Weight:      Height:       Physical Exam General:chronically ill appearing male in NAD Heart:RRR, no mrg Lungs:CTAB anteriolaterally Abdomen:soft, NTND Extremities:no LE edema Dialysis Access: temp R internal jugular dialysis catheter   Filed Weights   03/10/23 1330 03/17/23 1253 03/17/23 1621  Weight: 62 kg 62.9 kg 60.9 kg    Intake/Output Summary (Last 24 hours) at 03/20/2023 1516 Last data filed at 03/20/2023 1200 Gross per 24 hour  Intake 200 ml  Output 1 ml  Net 199 ml    Additional Objective Labs: Basic Metabolic Panel: Recent Labs  Lab 03/15/23 0420 03/16/23 0546 03/19/23 1450 03/20/23 0534  NA 137 134* 130* 134*  K 3.4* 4.3 3.3* 3.1*  CL 100 96* 95* 98  CO2 26 20* 24 26  GLUCOSE 99 149* 171* 114*  BUN 17 27* 21 21  CREATININE 4.06* 4.87* 4.17* 4.52*  CALCIUM 7.1* 7.1* 7.5* 7.4*  PHOS 2.1* 4.4  --   --    Liver Function Tests: Recent Labs  Lab 03/14/23 1152 03/15/23 0420 03/16/23 0546  AST 33  --   --   ALT 17  --   --   ALKPHOS 167*  --   --   BILITOT 0.9  --   --   PROT 6.5  --   --   ALBUMIN 2.1* 1.7* 1.8*   CBC: Recent Labs  Lab 03/15/23 0420 03/16/23 0546 03/19/23 1450 03/20/23 0534  WBC 4.9 6.2 7.5 5.6  HGB 9.0* 11.7* 10.1* 8.8*  HCT 28.1* 35.9* 30.7* 26.8*  MCV 88.1 89.8 88.5 89.6  PLT 94* 127* 151 126*   Blood Culture    Component Value  Date/Time   SDES HEMODIALYSIS GRAFT LEFT ARM 03/15/2023 1602   SPECREQUEST NONE 03/15/2023 1602   CULT  03/15/2023 1602    No growth aerobically or anaerobically. Performed at [facility], [address] [place], [place] [address]    REPTSTATUS 03/20/2023 FINAL 03/15/2023 1602    Studies/Results: DG Chest Port 1 View  Result Date: 03/20/2023 CLINICAL DATA:  Status post dialysis catheter insertion. EXAM: PORTABLE CHEST 1 VIEW COMPARISON:  03/15/2023 FINDINGS: There is a right sided dialysis catheter with tips in the right atrium. No pneumothorax identified. The heart size and mediastinal contours are unremarkable. Lung volumes are low. Asymmetric elevation of right hemidiaphragm. No superimposed pleural effusion, interstitial edema or airspace disease. IMPRESSION: Right-sided dialysis catheter with tips in the right atrium. No pneumothorax. Electronically Signed   By: Signa Kell M.D.   On: 03/20/2023 13:00   EP STUDY  Result Date: 03/20/2023 See surgical note for result.   Medications:  sodium chloride     vancomycin Stopped (03/17/23 1606)    amLODipine  10 mg Oral q morning   atorvastatin  20 mg Oral q morning   Chlorhexidine Gluconate Cloth  6 each Topical Q0600   Chlorhexidine Gluconate Cloth  6 each Topical Q0600   darbepoetin (ARANESP) injection - DIALYSIS  60 mcg Subcutaneous Q Thu-1800   feeding supplement (NEPRO CARB STEADY)  237 mL Oral TID BM   influenza vac split trivalent PF  0.5 mL Intramuscular Tomorrow-1000   insulin aspart  0-9 Units Subcutaneous TID WC   levothyroxine  25 mcg Oral QAC breakfast   liver oil-zinc oxide   Topical Daily   metoprolol tartrate  50 mg Oral BID   multivitamin with minerals  1 tablet Oral Daily   mupirocin ointment   Nasal BID   pantoprazole  40 mg Oral Daily   polyethylene glycol  17 g Oral Daily   sodium chloride flush  10-40 mL Intracatheter Q12H   sodium chloride flush  3 mL Intravenous Q12H   sorbitol  30 mL Oral Once     Dialysis Orders: [facility],, MWF. 4 hours. EDW 64 kg. LUE AVG 15-gauge. Flow rates: 400/autoflow 1.5. 3K/2.5 calcium. No heparin. Mircera 75 mcg every 2 weeks (last dose 9/11). Venofer 50 mg q. weekly.      Assessment/Plan: 1.  MRSA and E faecalis bacteremia - ID following. On vancomycin. TEE planned for 10/4.  Concern for seeding of AVG, VVS removed 10/2 with no obvious infection surround it. Temporary cath placed.  TEE today with vegetations noted on mitral valve and temporary dialysis catheter.  Patient will require line holiday.  Plan to remove tonight/tomorrow by IV team post HD and replace with [facility] later this week by VVS once infection improved.  Appreciate VVS & IV team assistance.  ESRD- HD MWF. HD today per regular schedule. Next HD later this week after line holiday.  COVID-19 PNA - received remdesivir.  Out of isolation.  Hypokalemia - chronic issue, liberalize diet. Improved. Using increased K bath.  Altered mental status - waxing and wanning.  Possible AVG infection- removed on 10/2 w/temp cath placement. Needs line holiday. See above.  HTN/volume -BP variable,  Hx hypOtension but BP now running high.  Under edw, likely weight loss.  UF as tolerated. Plan for lower dry weight on d/c.  Continue metoprolol and amlodipine, consider increasing amlodipine if BP remains elevated after dialysis today.  Anemia of CKD -Hgb drop 8.8, resume ESA . Secondary hyperparathyroidism -placed on binders, corrected calcium and phosphorus at goal Nutrition - Carb modified diet w/Fluid restrictions.  DMT2 - per PMD Debility - chronic issue.  Would benefit from SNF placement but previously has refused. Falling at home per caretaker when discussed at outpatient HD. Cared for by 30 year old mother who does her best but can not pick him up when he falls per her own account.  Agreed to SNF placement!  Virgina Norfolk, PA-C [facility] 03/20/2023,3:16 PM  LOS: 14 days

## 2023-03-20 NOTE — Progress Notes (Signed)
PT Cancellation Note  Patient Details Name: [name] [name] MRN: [id number] DOB: 04/12/1961   Cancelled Treatment:    Reason Eval/Treat Not Completed: Patient declined, no reason specified. Pt is upset that he has not had anything to eat today. PT reached out to MD concerning NPO order. Pt declining therapy at this time. PT will follow up as time allows.   Arlyss Gandy 03/20/2023, 2:29 PM

## 2023-03-20 NOTE — Transfer of Care (Signed)
Immediate Anesthesia Transfer of Care Note  Patient: [name] [name]  Procedure(s) Performed: TRANSESOPHAGEAL ECHOCARDIOGRAM  Patient Location: PACU  Anesthesia Type:General  Level of Consciousness: awake, oriented, and patient cooperative  Airway & Oxygen Therapy: Patient Spontanous Breathing and Patient connected to face mask oxygen  Post-op Assessment: Report given to RN and Post -op Vital signs reviewed and stable  Post vital signs: Reviewed and stable  Last Vitals:  Vitals Value Taken Time  BP 175/86 03/20/23 0856  Temp 36.5 C 03/20/23 0856  Pulse 57 03/20/23 0900  Resp 10 03/20/23 0900  SpO2 98 % 03/20/23 0900  Vitals shown include unfiled device data.  Last Pain:  Vitals:   03/19/23 2228  TempSrc:   PainSc: 0-No pain      Patients Stated Pain Goal: 0 (03/18/23 0950)  Complications: No notable events documented.

## 2023-03-20 NOTE — Progress Notes (Signed)
Planned to place [facility] today in OR. Dr Flora Lipps performed TEE prior. Appreciate him rearranging his schedule. Vegetation seen on mitral valve and temporary dialysis catheter.  Recommend dialysis and removal of temporary line with "line holiday." Will only place [facility] once ID clears [place] to do so. Please call when ready to be scheduled.  [name]. Lenell Antu, MD [facility] Phone Number: [phone] 03/20/2023 8:40 AM

## 2023-03-20 NOTE — Anesthesia Procedure Notes (Signed)
Procedure Name: Intubation Date/Time: 03/20/2023 8:05 AM  Performed by: Alwyn Ren, CRNAPre-anesthesia Checklist: Patient identified, Emergency Drugs available, Suction available and Patient being monitored Patient Re-evaluated:Patient Re-evaluated prior to induction Oxygen Delivery Method: Circle system utilized Preoxygenation: Pre-oxygenation with 100% oxygen Induction Type: IV induction Ventilation: Mask ventilation without difficulty Laryngoscope Size: Mac and 4 Grade View: Grade II Tube type: Oral Number of attempts: 1 Airway Equipment and Method: Stylet and Oral airway Placement Confirmation: ETT inserted through vocal cords under direct vision, positive ETCO2 and breath sounds checked- equal and bilateral Secured at: 22 cm Tube secured with: Tape Dental Injury: Teeth and Oropharynx as per pre-operative assessment  Comments: Placed by SRNA C. Holmes under supervision of MD Mal Amabile

## 2023-03-20 NOTE — Progress Notes (Signed)
Pt A&Ox4, consent for procedure signed by pt.

## 2023-03-21 ENCOUNTER — Encounter (HOSPITAL_COMMUNITY): Payer: Self-pay | Admitting: Cardiovascular Disease

## 2023-03-21 ENCOUNTER — Encounter (HOSPITAL_COMMUNITY)
Admission: RE | Admit: 2023-03-21 | Discharge: 2023-03-21 | Disposition: A | Discharge status: Home / Self Care | Payer: Medicare HMO | Source: Ambulatory Visit | Attending: Gastroenterology | Admitting: Gastroenterology

## 2023-03-21 DIAGNOSIS — G9341 Metabolic encephalopathy: Secondary | ICD-10-CM | POA: Diagnosis not present

## 2023-03-21 DIAGNOSIS — R7881 Bacteremia: Secondary | ICD-10-CM

## 2023-03-21 DIAGNOSIS — B952 Enterococcus as the cause of diseases classified elsewhere: Secondary | ICD-10-CM

## 2023-03-21 DIAGNOSIS — I059 Rheumatic mitral valve disease, unspecified: Secondary | ICD-10-CM | POA: Diagnosis present

## 2023-03-21 DIAGNOSIS — T827XXA Infection and inflammatory reaction due to other cardiac and vascular devices, implants and grafts, initial encounter: Secondary | ICD-10-CM

## 2023-03-21 DIAGNOSIS — R410 Disorientation, unspecified: Secondary | ICD-10-CM

## 2023-03-21 LAB — GLUCOSE, CAPILLARY
Glucose-Capillary: 104 mg/dL — ABNORMAL HIGH (ref 70–99)
Glucose-Capillary: 139 mg/dL — ABNORMAL HIGH (ref 70–99)
Glucose-Capillary: 125 mg/dL — ABNORMAL HIGH (ref 70–99)

## 2023-03-21 MED ORDER — HYDRALAZINE HCL 10 MG PO TABS
10.0000 mg | ORAL_TABLET | Freq: Three times a day (TID) | ORAL | Status: DC
  Administered 2023-03-21 – 2023-03-29 (×21): 10 mg via ORAL
  Filled 2023-03-21 (×25): qty 1

## 2023-03-21 NOTE — Progress Notes (Signed)
   03/20/23 2345  Vitals  BP 137/87  MAP (mmHg) 103  Pulse Rate 73  ECG Heart Rate 74  Resp 13  Oxygen Therapy  SpO2 100 %  Hemodialysis Catheter Right Internal jugular Double lumen Temporary (Non-Tunneled)  Placement Date/Time: 03/15/23 1604   Time Out: Correct patient;Correct site;Correct procedure  Maximum sterile barrier precautions: Hand hygiene;Cap;Mask;Sterile gown;Sterile gloves;Large sterile sheet  Site Prep: Chlorhexidine (preferred);Skin Prep C...  Site Condition No complications  Blue Lumen Status Flushed;Heparin locked;Dead end cap in place  Red Lumen Status Flushed;Heparin locked;Dead end cap in place  Purple Lumen Status Other (Comment) (Not in use)  Catheter fill solution Heparin 1000 units/ml  Catheter fill volume (Arterial) 1.4 cc  Catheter fill volume (Venous) 1.4  Dressing Type Transparent  Dressing Status Antimicrobial disc in place;Clean, Dry, Intact  Interventions Dressing changed;Antimicrobial disc changed;New dressing  Drainage Description None  Dressing Change Due 03/27/23  Post treatment catheter status Capped and Clamped

## 2023-03-21 NOTE — Progress Notes (Signed)
Pt. Internal jugular cath removed as per order.Pt. tolerated well ,site clean,dry and dressing intact.

## 2023-03-21 NOTE — Progress Notes (Signed)
PROGRESS NOTE    [name] [name]  SEG:[id number] DOB: 10/16/1960 DOA: 03/06/2023 PCP: Farris Has, MD   Brief Narrative: 62 year old with past medical history significant for ESRD on hemodialysis MWF, hypertension, diabetes type 2, pulmonary hypertension, cardiomyopathy, GERD, dyslipidemia, anemia of CKD presents to the ED with worsening mental status.  He was admitted with acute metabolic encephalopathy secondary to UTI and COVID-pneumonia and also missed hemodialysis sections.  He was found to have MRSA and enterococcal bacteremia. He was started on Vancomycin.  ID has been following.  Patient was transferred to [facility] for fistula removal.  Vascular surgery was notified.     Assessment & Plan:   Principal Problem:   Acute metabolic encephalopathy Active Problems:   Essential hypertension   Neuropathy   Anemia of chronic disease   Hypokalemia   Hypothyroidism   GERD (gastroesophageal reflux disease)   ESRD (end stage renal disease) (HCC)   Protein-calorie malnutrition, severe  1-MRSA and Enterococcus bacteremia, Mitral Valve Endocarditis,  Dialysis  Catheter Tip on the right atrium with 6.4 mm x 2.8 mm mobile mass consistent with Vegetation.  -No significant findings on ultrasound of fistula or CT maxillofacial  -ID following -Concern  for infection in the AV graft.  Underwent  excision of graft  10/2 -Echocardiogram raise concern for endocarditis -TEE 10/07: Small linear 2.3 mm mobile vegetation attached to the lateral commissure of the P1 segment of the mitral valve consistent with vegetation.  Dialysis catheter tip in the right atrium with 6.4 mm x 2.8 mm mobile mass consistent with vegetation -Need holiday line.  Hemodialysis catheter removed 10/8. -On IV vancomycin.   COVID infection Completed course of Remdesivir Completed steroid.  Continue isolation.  He tested +9/23.   Off isolation since 10/03   ESRD on hemodialysis MWF Managed per nephrology Underwent  excision of AV fistula due to infection.  Temporary HD  catheter with vegetation.  Removed 10/08 . He will need holiday line.   Acute metabolic encephalopathy In the setting of UTI/COVID, missed hemodialysis and bacteremia Likely has some cognitive impairment at baseline Alert, less confuse.    Anemia of chronic kidney disease Stable  Hypertension Continue with metoprolol and amlodipine. Will add Hydralazine  Diabetes type 2: Continue with sliding scale insulin  Hypothyroidism Continue with Synthroid  GERD: Continue  with PPI   Thrombocytopenia.  Improving.    Hypoalbuminemia: On supplements       Nutrition Problem: Severe Malnutrition Etiology: chronic illness    Signs/Symptoms: severe muscle depletion, severe fat depletion    Interventions: Nepro shake  Estimated body mass index is 19.26 kg/m as calculated from the following:   Height as of this encounter: 5\' 10"  (1.778 m).   Weight as of this encounter: 60.9 kg.   DVT prophylaxis: SCDs Code Status: Full code Family Communication: Mother updated at bedside 10/07 Disposition Plan:  Status is: Inpatient Remains inpatient appropriate because: Management of COVID, bacteremia.     Consultants:  Nephrology ID Vascular-Dr. Lenell Antu Procedures:    Antimicrobials:    Subjective: No new complaints.  He ate breakfast  Less confuse  Objective: Vitals:   03/20/23 2345 03/21/23 0019 03/21/23 0445 03/21/23 0829  BP: 137/87 (!) 174/89 (!) 162/95 (!) 162/78  Pulse: 73 78 64 69  Resp: 13 16  20   Temp:  98.1 F (36.7 C) 98 F (36.7 C) 98 F (36.7 C)  TempSrc:  Oral    SpO2: 100% 100% 100% 100%  Weight:      Height:  Intake/Output Summary (Last 24 hours) at 03/21/2023 1222 Last data filed at 03/21/2023 0824 Gross per 24 hour  Intake 220 ml  Output 2000 ml  Net -1780 ml   Filed Weights   03/10/23 1330 03/17/23 1253 03/17/23 1621  Weight: 62 kg 62.9 kg 60.9 kg     Examination:  General exam: NAD Respiratory system: CTA Cardiovascular system: S 1, S 2 RRR Gastrointestinal system: BS present, soft, nt Central nervous system: Alert   Data Reviewed: I have personally reviewed following labs and imaging studies  CBC: Recent Labs  Lab 03/15/23 0420 03/16/23 0546 03/19/23 1450 03/20/23 0534  WBC 4.9 6.2 7.5 5.6  HGB 9.0* 11.7* 10.1* 8.8*  HCT 28.1* 35.9* 30.7* 26.8*  MCV 88.1 89.8 88.5 89.6  PLT 94* 127* 151 126*   Basic Metabolic Panel: Recent Labs  Lab 03/15/23 0420 03/16/23 0546 03/19/23 1450 03/20/23 0534  NA 137 134* 130* 134*  K 3.4* 4.3 3.3* 3.1*  CL 100 96* 95* 98  CO2 26 20* 24 26  GLUCOSE 99 149* 171* 114*  BUN 17 27* 21 21  CREATININE 4.06* 4.87* 4.17* 4.52*  CALCIUM 7.1* 7.1* 7.5* 7.4*  PHOS 2.1* 4.4  --   --    GFR: Estimated Creatinine Clearance: 14.2 mL/min (A) (by C-G formula based on SCr of 4.52 mg/dL (H)). Liver Function Tests: Recent Labs  Lab 03/15/23 0420 03/16/23 0546  ALBUMIN 1.7* 1.8*   No results for input(s): "LIPASE", "AMYLASE" in the last 168 hours. No results for input(s): "AMMONIA" in the last 168 hours. Coagulation Profile: No results for input(s): "INR", "PROTIME" in the last 168 hours. Cardiac Enzymes: No results for input(s): "CKTOTAL", "CKMB", "CKMBINDEX", "TROPONINI" in the last 168 hours. BNP (last 3 results) No results for input(s): "PROBNP" in the last 8760 hours. HbA1C: No results for input(s): "HGBA1C" in the last 72 hours. CBG: Recent Labs  Lab 03/20/23 0856 03/20/23 1117 03/20/23 1643 03/21/23 0721 03/21/23 1106  GLUCAP 94 103* 134* 104* 139*   Lipid Profile: No results for input(s): "CHOL", "HDL", "LDLCALC", "TRIG", "CHOLHDL", "LDLDIRECT" in the last 72 hours. Thyroid Function Tests: No results for input(s): "TSH", "T4TOTAL", "FREET4", "T3FREE", "THYROIDAB" in the last 72 hours. Anemia Panel: No results for input(s): "VITAMINB12", "FOLATE", "FERRITIN", "TIBC",  "IRON", "RETICCTPCT" in the last 72 hours. Sepsis Labs: No results for input(s): "PROCALCITON", "LATICACIDVEN" in the last 168 hours.  Recent Results (from the past 240 hour(s))  Aerobic/Anaerobic Culture w Gram Stain (surgical/deep wound)     Status: None   Collection Time: 03/15/23  4:02 PM   Specimen: Hemodialysis Graft; Blood  Result Value Ref Range Status   Specimen Description HEMODIALYSIS GRAFT LEFT ARM  Final   Special Requests NONE  Final   Gram Stain   Final    RARE WBC PRESENT, PREDOMINANTLY PMN NO ORGANISMS SEEN    Culture   Final    No growth aerobically or anaerobically. Performed at [facility], [address], [place], [place] [address]    Report Status 03/20/2023 FINAL  Final         Radiology Studies: ECHO TEE  Result Date: 03/20/2023    TRANSESOPHOGEAL ECHO REPORT   Patient Name:   [name] [name] Date of Exam: 03/20/2023 Medical Rec #:  [id number]     Height:       70.0 in Accession #:    [id number]    Weight:       134.3 lb Date of Birth:  Mar 24, 1961  Intake/Output Summary (Last 24 hours) at 03/21/2023 1222 Last data filed at 03/21/2023 0824 Gross per 24 hour  Intake 220 ml  Output 2000 ml  Net -1780 ml   Filed Weights   03/10/23 1330 03/17/23 1253 03/17/23 1621  Weight: 62 kg 62.9 kg 60.9 kg     Examination:  General exam: NAD Respiratory system: CTA Cardiovascular system: S 1, S 2 RRR Gastrointestinal system: BS present, soft, nt Central nervous system: Alert   Data Reviewed: I have personally reviewed following labs and imaging studies  CBC: Recent Labs  Lab 03/15/23 0420 03/16/23 0546 03/19/23 1450 03/20/23 0534  WBC 4.9 6.2 7.5 5.6  HGB 9.0* 11.7* 10.1* 8.8*  HCT 28.1* 35.9* 30.7* 26.8*  MCV 88.1 89.8 88.5 89.6  PLT 94* 127* 151 126*   Basic Metabolic Panel: Recent Labs  Lab 03/15/23 0420 03/16/23 0546 03/19/23 1450 03/20/23 0534  NA 137 134* 130* 134*  K 3.4* 4.3 3.3* 3.1*  CL 100 96* 95* 98  CO2 26 20* 24 26  GLUCOSE 99 149* 171* 114*  BUN 17 27* 21 21  CREATININE 4.06* 4.87* 4.17* 4.52*  CALCIUM 7.1* 7.1* 7.5* 7.4*  PHOS 2.1* 4.4  --   --    GFR: Estimated Creatinine Clearance: 14.2 mL/min (A) (by C-G formula based on SCr of 4.52 mg/dL (H)). Liver Function Tests: Recent Labs  Lab 03/15/23 0420 03/16/23 0546  ALBUMIN 1.7* 1.8*   No results for input(s): "LIPASE", "AMYLASE" in the last 168 hours. No results for input(s): "AMMONIA" in the last 168 hours. Coagulation Profile: No results for input(s): "INR", "PROTIME" in the last 168 hours. Cardiac Enzymes: No results for input(s): "CKTOTAL", "CKMB", "CKMBINDEX", "TROPONINI" in the last 168 hours. BNP (last 3 results) No results for input(s): "PROBNP" in the last 8760 hours. HbA1C: No results for input(s): "HGBA1C" in the last 72 hours. CBG: Recent Labs  Lab 03/20/23 0856 03/20/23 1117 03/20/23 1643 03/21/23 0721 03/21/23 1106  GLUCAP 94 103* 134* 104* 139*   Lipid Profile: No results for input(s): "CHOL", "HDL", "LDLCALC", "TRIG", "CHOLHDL", "LDLDIRECT" in the last 72 hours. Thyroid Function Tests: No results for input(s): "TSH", "T4TOTAL", "FREET4", "T3FREE", "THYROIDAB" in the last 72 hours. Anemia Panel: No results for input(s): "VITAMINB12", "FOLATE", "FERRITIN", "TIBC",  "IRON", "RETICCTPCT" in the last 72 hours. Sepsis Labs: No results for input(s): "PROCALCITON", "LATICACIDVEN" in the last 168 hours.  Recent Results (from the past 240 hour(s))  Aerobic/Anaerobic Culture w Gram Stain (surgical/deep wound)     Status: None   Collection Time: 03/15/23  4:02 PM   Specimen: Hemodialysis Graft; Blood  Result Value Ref Range Status   Specimen Description HEMODIALYSIS GRAFT LEFT ARM  Final   Special Requests NONE  Final   Gram Stain   Final    RARE WBC PRESENT, PREDOMINANTLY PMN NO ORGANISMS SEEN    Culture   Final    No growth aerobically or anaerobically. Performed at [facility], [address], [place], [place] [address]    Report Status 03/20/2023 FINAL  Final         Radiology Studies: ECHO TEE  Result Date: 03/20/2023    TRANSESOPHOGEAL ECHO REPORT   Patient Name:   [name] [name] Date of Exam: 03/20/2023 Medical Rec #:  [id number]     Height:       70.0 in Accession #:    [id number]    Weight:       134.3 lb Date of Birth:  Mar 24, 1961  Intake/Output Summary (Last 24 hours) at 03/21/2023 1222 Last data filed at 03/21/2023 0824 Gross per 24 hour  Intake 220 ml  Output 2000 ml  Net -1780 ml   Filed Weights   03/10/23 1330 03/17/23 1253 03/17/23 1621  Weight: 62 kg 62.9 kg 60.9 kg     Examination:  General exam: NAD Respiratory system: CTA Cardiovascular system: S 1, S 2 RRR Gastrointestinal system: BS present, soft, nt Central nervous system: Alert   Data Reviewed: I have personally reviewed following labs and imaging studies  CBC: Recent Labs  Lab 03/15/23 0420 03/16/23 0546 03/19/23 1450 03/20/23 0534  WBC 4.9 6.2 7.5 5.6  HGB 9.0* 11.7* 10.1* 8.8*  HCT 28.1* 35.9* 30.7* 26.8*  MCV 88.1 89.8 88.5 89.6  PLT 94* 127* 151 126*   Basic Metabolic Panel: Recent Labs  Lab 03/15/23 0420 03/16/23 0546 03/19/23 1450 03/20/23 0534  NA 137 134* 130* 134*  K 3.4* 4.3 3.3* 3.1*  CL 100 96* 95* 98  CO2 26 20* 24 26  GLUCOSE 99 149* 171* 114*  BUN 17 27* 21 21  CREATININE 4.06* 4.87* 4.17* 4.52*  CALCIUM 7.1* 7.1* 7.5* 7.4*  PHOS 2.1* 4.4  --   --    GFR: Estimated Creatinine Clearance: 14.2 mL/min (A) (by C-G formula based on SCr of 4.52 mg/dL (H)). Liver Function Tests: Recent Labs  Lab 03/15/23 0420 03/16/23 0546  ALBUMIN 1.7* 1.8*   No results for input(s): "LIPASE", "AMYLASE" in the last 168 hours. No results for input(s): "AMMONIA" in the last 168 hours. Coagulation Profile: No results for input(s): "INR", "PROTIME" in the last 168 hours. Cardiac Enzymes: No results for input(s): "CKTOTAL", "CKMB", "CKMBINDEX", "TROPONINI" in the last 168 hours. BNP (last 3 results) No results for input(s): "PROBNP" in the last 8760 hours. HbA1C: No results for input(s): "HGBA1C" in the last 72 hours. CBG: Recent Labs  Lab 03/20/23 0856 03/20/23 1117 03/20/23 1643 03/21/23 0721 03/21/23 1106  GLUCAP 94 103* 134* 104* 139*   Lipid Profile: No results for input(s): "CHOL", "HDL", "LDLCALC", "TRIG", "CHOLHDL", "LDLDIRECT" in the last 72 hours. Thyroid Function Tests: No results for input(s): "TSH", "T4TOTAL", "FREET4", "T3FREE", "THYROIDAB" in the last 72 hours. Anemia Panel: No results for input(s): "VITAMINB12", "FOLATE", "FERRITIN", "TIBC",  "IRON", "RETICCTPCT" in the last 72 hours. Sepsis Labs: No results for input(s): "PROCALCITON", "LATICACIDVEN" in the last 168 hours.  Recent Results (from the past 240 hour(s))  Aerobic/Anaerobic Culture w Gram Stain (surgical/deep wound)     Status: None   Collection Time: 03/15/23  4:02 PM   Specimen: Hemodialysis Graft; Blood  Result Value Ref Range Status   Specimen Description HEMODIALYSIS GRAFT LEFT ARM  Final   Special Requests NONE  Final   Gram Stain   Final    RARE WBC PRESENT, PREDOMINANTLY PMN NO ORGANISMS SEEN    Culture   Final    No growth aerobically or anaerobically. Performed at [facility], [address], [place], [place] [address]    Report Status 03/20/2023 FINAL  Final         Radiology Studies: ECHO TEE  Result Date: 03/20/2023    TRANSESOPHOGEAL ECHO REPORT   Patient Name:   [name] [name] Date of Exam: 03/20/2023 Medical Rec #:  [id number]     Height:       70.0 in Accession #:    [id number]    Weight:       134.3 lb Date of Birth:  Mar 24, 1961  PROGRESS NOTE    [name] [name]  SEG:[id number] DOB: 10/16/1960 DOA: 03/06/2023 PCP: Farris Has, MD   Brief Narrative: 62 year old with past medical history significant for ESRD on hemodialysis MWF, hypertension, diabetes type 2, pulmonary hypertension, cardiomyopathy, GERD, dyslipidemia, anemia of CKD presents to the ED with worsening mental status.  He was admitted with acute metabolic encephalopathy secondary to UTI and COVID-pneumonia and also missed hemodialysis sections.  He was found to have MRSA and enterococcal bacteremia. He was started on Vancomycin.  ID has been following.  Patient was transferred to [facility] for fistula removal.  Vascular surgery was notified.     Assessment & Plan:   Principal Problem:   Acute metabolic encephalopathy Active Problems:   Essential hypertension   Neuropathy   Anemia of chronic disease   Hypokalemia   Hypothyroidism   GERD (gastroesophageal reflux disease)   ESRD (end stage renal disease) (HCC)   Protein-calorie malnutrition, severe  1-MRSA and Enterococcus bacteremia, Mitral Valve Endocarditis,  Dialysis  Catheter Tip on the right atrium with 6.4 mm x 2.8 mm mobile mass consistent with Vegetation.  -No significant findings on ultrasound of fistula or CT maxillofacial  -ID following -Concern  for infection in the AV graft.  Underwent  excision of graft  10/2 -Echocardiogram raise concern for endocarditis -TEE 10/07: Small linear 2.3 mm mobile vegetation attached to the lateral commissure of the P1 segment of the mitral valve consistent with vegetation.  Dialysis catheter tip in the right atrium with 6.4 mm x 2.8 mm mobile mass consistent with vegetation -Need holiday line.  Hemodialysis catheter removed 10/8. -On IV vancomycin.   COVID infection Completed course of Remdesivir Completed steroid.  Continue isolation.  He tested +9/23.   Off isolation since 10/03   ESRD on hemodialysis MWF Managed per nephrology Underwent  excision of AV fistula due to infection.  Temporary HD  catheter with vegetation.  Removed 10/08 . He will need holiday line.   Acute metabolic encephalopathy In the setting of UTI/COVID, missed hemodialysis and bacteremia Likely has some cognitive impairment at baseline Alert, less confuse.    Anemia of chronic kidney disease Stable  Hypertension Continue with metoprolol and amlodipine. Will add Hydralazine  Diabetes type 2: Continue with sliding scale insulin  Hypothyroidism Continue with Synthroid  GERD: Continue  with PPI   Thrombocytopenia.  Improving.    Hypoalbuminemia: On supplements       Nutrition Problem: Severe Malnutrition Etiology: chronic illness    Signs/Symptoms: severe muscle depletion, severe fat depletion    Interventions: Nepro shake  Estimated body mass index is 19.26 kg/m as calculated from the following:   Height as of this encounter: 5\' 10"  (1.778 m).   Weight as of this encounter: 60.9 kg.   DVT prophylaxis: SCDs Code Status: Full code Family Communication: Mother updated at bedside 10/07 Disposition Plan:  Status is: Inpatient Remains inpatient appropriate because: Management of COVID, bacteremia.     Consultants:  Nephrology ID Vascular-Dr. Lenell Antu Procedures:    Antimicrobials:    Subjective: No new complaints.  He ate breakfast  Less confuse  Objective: Vitals:   03/20/23 2345 03/21/23 0019 03/21/23 0445 03/21/23 0829  BP: 137/87 (!) 174/89 (!) 162/95 (!) 162/78  Pulse: 73 78 64 69  Resp: 13 16  20   Temp:  98.1 F (36.7 C) 98 F (36.7 C) 98 F (36.7 C)  TempSrc:  Oral    SpO2: 100% 100% 100% 100%  Weight:      Height:

## 2023-03-21 NOTE — Progress Notes (Signed)
Received patient in bed to unit.  Alert and oriented.  Informed consent signed and in chart.   TX duration: 3:00  Patient tolerated well.  Transported back to the room  Alert, without acute distress.  Hand-off given to patient's nurse.   Access used: RIJ Access issues: None  Total UF removed: 2000 mL Medication(s) given: Vancomycin 750 mg Post HD VS: please see data insert    03/20/23 2247  Vitals  Temp 97.8 F (36.6 C)  Temp Source Oral  BP (!) 147/78  MAP (mmHg) 101  BP Location Right Arm  BP Method Automatic  Patient Position (if appropriate) Lying  Pulse Rate 73  Pulse Rate Source Monitor  ECG Heart Rate 75  Resp 11  Oxygen Therapy  SpO2 100 %  O2 Device Room Air  Patient Activity (if Appropriate) In bed  Pulse Oximetry Type Continuous  Post Treatment  Dialyzer Clearance Lightly streaked  Hemodialysis Intake (mL) 0 mL  Liters Processed 72  Fluid Removed (mL) 2000 mL  Tolerated HD Treatment Yes  Post-Hemodialysis Comments Treatment completed and blood returned without issue.  Note  Patient Observations Patient alert, no c/o voiced, no acute distress noted; patient condition stable for retrun transport.  Hemodialysis Catheter Right Internal jugular Double lumen Temporary (Non-Tunneled)  Placement Date/Time: 03/15/23 1604   Time Out: Correct patient;Correct site;Correct procedure  Maximum sterile barrier precautions: Hand hygiene;Cap;Mask;Sterile gown;Sterile gloves;Large sterile sheet  Site Prep: Chlorhexidine (preferred);Skin Prep C...  Site Condition No complications  Blue Lumen Status Flushed;Infusing  Red Lumen Status Flushed  Purple Lumen Status Other (Comment) (Not in use)      [name]

## 2023-03-21 NOTE — Progress Notes (Signed)
Physical Therapy Treatment Patient Details Name: [name] [name] MRN: [id number] DOB: 1960/09/11 Today's Date: 03/21/2023   History of Present Illness [name] [name] is a 62 y.o. male who presents to the ED with worsening mental status. He was admitted with acute metabolic encephalopathy secondary to UTI and COVID-pneumonia and also missed hemodialysis sections. He was found to have MRSA and enterococcal bacteremia. He was started on Vancomycin. Patient was transferred to [facility] for fistula removal. 10/2 s/p placement of temporary dialysis catheter (20cm RIJ) using ultrasound and fluoroscopic guidance, harvest of left basilic vein, direct repair of left brachial artery with vein patch angioplasty, excision of left arm arteriovenous graft.  PMH: ESRD on HD MWF, hypertension, type 2 diabetes, pulmonary hypertension, cardiomyopathy, GERD, dyslipidemia, and anemia of CKD.    PT Comments  Pt received in supine, agreeable to therapy session with good participation in seated tasks and poor seated balance when unsupported. Pt able to sit with fair balance with BUE support. Pt standing tolerance limited due to anxiety and expressed fear of falls but was able to perform stand pivot transfer from bed>chair with RW and mod progressing to maxA due to difficulty sequencing and impulsivity to sit. Pt appears to have bottom discomfort and RN notified, bed pad appears clean but not able to assess site due to poor standing tolerance/heavy assist needed when standing. Recommend Stedy for return transfer from chair>bed. Pt continues to benefit from PT services to progress toward functional mobility goals.     If plan is discharge home, recommend the following: A lot of help with bathing/dressing/bathroom;Assistance with cooking/housework;Help with stairs or ramp for entrance;Assist for transportation;Two people to help with walking and/or transfers;Direct supervision/assist for medications management;Direct  supervision/assist for financial management;Supervision due to cognitive status   Can travel by private vehicle     No  Equipment Recommendations  BSC/3in1;Other (comment) (currently rec hoyer lift, pending progress)    Recommendations for Other Services       Precautions / Restrictions Precautions Precautions: Fall Precaution Comments: history of falls, Contact precs Restrictions Weight Bearing Restrictions: No     Mobility  Bed Mobility Overal bed mobility: Needs Assistance Bed Mobility: Sit to Supine, Rolling, Sidelying to Sit Rolling: Min assist Sidelying to sit: HOB elevated, Used rails, Mod assist       General bed mobility comments: max cues for initiation and use of rails/technique for self-assist; pt needs multimodal cues and increased time to attempt    Transfers Overall transfer level: Needs assistance Equipment used: Rolling walker (2 wheels) Transfers: Sit to/from Stand, Bed to chair/wheelchair/BSC Sit to Stand: Mod assist Stand pivot transfers: Max assist Step pivot transfers: From elevated surface, Max assist       General transfer comment: bed<>RW with ~2 lateral steps, pt sitting back down prior to reaching chair due to expressed fear of falls. Pt encouraged to stand again (chair moved closer to bed) and pt performed RW>chair, pt sitting prematurely due to reported fear of falls and anxiety and needing maxA to complete stand pivot. Pt demos posterior lean throughout.    Ambulation/Gait               General Gait Details: pt not safe to attempt, sitting prematurely during step pivot from bed>chair and difficulty weight shifting due to fear of falls. No c/o dizziness.   Stairs             Wheelchair Mobility     Tilt Bed    Modified Rankin (Stroke Patients Only)  Balance Overall balance assessment: Needs assistance Sitting-balance support: Feet supported, Single extremity supported Sitting balance-Leahy Scale:  Poor Sitting balance - Comments: posterior LOB when unsupported; close Supervision with BUE support due to pt cog deficit and impulsivity Postural control: Posterior lean Standing balance support: Reliant on assistive device for balance, Bilateral upper extremity supported Standing balance-Leahy Scale: Poor Standing balance comment: mod to maxA with RW, post lean, fear of falls                            Cognition Arousal: Alert Behavior During Therapy: Anxious, Impulsive, Lability Overall Cognitive Status: No family/caregiver present to determine baseline cognitive functioning (impaired per chart review; no family present) Area of Impairment: Safety/judgement, Awareness, Memory, Following commands, Problem solving                     Memory: Decreased recall of precautions, Decreased short-term memory Following Commands: Follows one step commands with increased time Safety/Judgement: Decreased awareness of safety, Decreased awareness of deficits Awareness: Intellectual Problem Solving: Difficulty sequencing, Requires verbal cues, Requires tactile cues General Comments: Apparent cognitive deficit with pt perseverating on fear of falls today and missing his mother. Pt agreeable to initial plan discussed by PTA, but when time came to try standing portion, pt reluctant and continually expresses fear of falls. RN/NT notified we rec Stedy for return transfer to bed from chair due to pt anxiety/impulsivity to attempt sitting prior to reaching safe surface. Pt screeching when standing but states it is due to fear of falls, not pain.        Exercises Other Exercises Other Exercises: seated BLE AAROM: hip flexion, LAQ x10 reps ea (pt needs max multimodal cues for proper technique due to cognitive deficit) Other Exercises: supine BLE AROM: ankle pumps, heel slides x5 reps ea    General Comments General comments (skin integrity, edema, etc.): RN notified PTA was not able to  assess bottom while pt standing due to need for heavy assist; may need skin check due to pt expressed discomfort when scooting at EOB.      Pertinent Vitals/Pain Pain Assessment Pain Assessment: PAINAD Breathing: normal Negative Vocalization: repeated troubled calling out, loud moaning/groaning, crying Facial Expression: sad, frightened, frown Body Language: relaxed Consolability: distracted or reassured by voice/touch PAINAD Score: 4 Pain Location: not localized, pt grimacing during seated scooting so may be bottom pain. Pain Descriptors / Indicators: Guarding, Grimacing, Moaning Pain Intervention(s): Limited activity within patient's tolerance, Monitored during session, Repositioned    Home Living                          Prior Function            PT Goals (current goals can now be found in the care plan section) Acute Rehab PT Goals Patient Stated Goal: To get stronger at rehab so I can go home where my mother can help me. PT Goal Formulation: With patient/family Time For Goal Achievement: 03/22/23 Progress towards PT goals: Progressing toward goals    Frequency    Min 1X/week      PT Plan      Co-evaluation              AM-PAC PT "6 Clicks" Mobility   Outcome Measure  Help needed turning from your back to your side while in a flat bed without using bedrails?: A Little Help needed moving from lying on  your back to sitting on the side of a flat bed without using bedrails?: A Lot (mod cues) Help needed moving to and from a bed to a chair (including a wheelchair)?: A Lot Help needed standing up from a chair using your arms (e.g., wheelchair or bedside chair)?: A Lot Help needed to walk in hospital room?: Total Help needed climbing 3-5 steps with a railing? : Total 6 Click Score: 11    End of Session Equipment Utilized During Treatment: Gait belt Activity Tolerance: Treatment limited secondary to medical complications (Comment);Other (comment)  (self-limiting standing tasks due to stated fear of falls) Patient left: with call bell/phone within reach;in chair;with chair alarm set;Other (comment) (pt set up to eat his lunch tray; RN/NT notified to use Stedy for return transfer due to pt impulsivity/fear of falls) Nurse Communication: Mobility status;Other (comment);Need for lift equipment (c/o bottom pain when scooting but PTA unable to see due to pt need for heavy lift assist/limited standing tolerance; Stedy for return, also wrote on his board) PT Visit Diagnosis: Unsteadiness on feet (R26.81);Other abnormalities of gait and mobility (R26.89);Muscle weakness (generalized) (M62.81)     Time: 2956-2130 PT Time Calculation (min) (ACUTE ONLY): 19 min  Charges:    $Therapeutic Activity: 8-22 mins PT General Charges $$ ACUTE PT VISIT: 1 Visit                     Twylah Bennetts P., PTA Acute Rehabilitation Services Secure Chat Preferred 9a-5:30pm Office: [phone]    [name] [facility] 03/21/2023, 12:29 PM

## 2023-03-21 NOTE — Progress Notes (Signed)
[facility] Progress Note   Subjective:   Patient seen and examined at bedside.  Oriented x3.  Believes he is going home today.  Discussed current diagnosis and plan.  Patient reluctantly agreed.  Denies CP, SOB, abdominal pain and n/v/d.   Objective Vitals:   03/20/23 2345 03/21/23 0019 03/21/23 0445 03/21/23 0829  BP: 137/87 (!) 174/89 (!) 162/95 (!) 162/78  Pulse: 73 78 64 69  Resp: 13 16  20   Temp:  98.1 F (36.7 C) 98 F (36.7 C) 98 F (36.7 C)  TempSrc:  Oral    SpO2: 100% 100% 100% 100%  Weight:      Height:       Physical Exam General:Chronically ill appearing male in NAD Heart:RRR, no mrg Lungs:CTAB, nml WOB on RA Abdomen:soft, NTND Extremities:no LE edema Dialysis Access: none   Filed Weights   03/10/23 1330 03/17/23 1253 03/17/23 1621  Weight: 62 kg 62.9 kg 60.9 kg    Intake/Output Summary (Last 24 hours) at 03/21/2023 1328 Last data filed at 03/21/2023 0824 Gross per 24 hour  Intake 220 ml  Output 2000 ml  Net -1780 ml    Additional Objective Labs: Basic Metabolic Panel: Recent Labs  Lab 03/15/23 0420 03/16/23 0546 03/19/23 1450 03/20/23 0534  NA 137 134* 130* 134*  K 3.4* 4.3 3.3* 3.1*  CL 100 96* 95* 98  CO2 26 20* 24 26  GLUCOSE 99 149* 171* 114*  BUN 17 27* 21 21  CREATININE 4.06* 4.87* 4.17* 4.52*  CALCIUM 7.1* 7.1* 7.5* 7.4*  PHOS 2.1* 4.4  --   --    Liver Function Tests: Recent Labs  Lab 03/15/23 0420 03/16/23 0546  ALBUMIN 1.7* 1.8*    CBC: Recent Labs  Lab 03/15/23 0420 03/16/23 0546 03/19/23 1450 03/20/23 0534  WBC 4.9 6.2 7.5 5.6  HGB 9.0* 11.7* 10.1* 8.8*  HCT 28.1* 35.9* 30.7* 26.8*  MCV 88.1 89.8 88.5 89.6  PLT 94* 127* 151 126*   Blood Culture    Component Value Date/Time   SDES HEMODIALYSIS GRAFT LEFT ARM 03/15/2023 1602   SPECREQUEST NONE 03/15/2023 1602   CULT  03/15/2023 1602    No growth aerobically or anaerobically. Performed at [name], [address] [place],  [place] [address]    REPTSTATUS 03/20/2023 FINAL 03/15/2023 1602    CBG: Recent Labs  Lab 03/20/23 0856 03/20/23 1117 03/20/23 1643 03/21/23 0721 03/21/23 1106  GLUCAP 94 103* 134* 104* 139*   Studies/Results: ECHO TEE  Result Date: 03/20/2023    TRANSESOPHOGEAL ECHO REPORT   Patient Name:   CALIBER OMANA Date of Exam: 03/20/2023 Medical Rec #:  [id number]     Height:       70.0 in Accession #:    [id number]    Weight:       134.3 lb Date of Birth:  May 05, 1961     BSA:          1.762 m Patient Age:    62 years      BP:           176/92 mmHg Patient Gender: M             HR:           57 bpm. Exam Location:  Inpatient Procedure: 3D Echo, Cardiac Doppler, Color Doppler and Transesophageal Echo Indications:     PHTN and Endocarditis  History:         Patient has prior history of Echocardiogram  examinations, most                  recent 03/09/2033. Cardiomyopathy, Pulmonary HTN; Risk                  Factors:Hypertension, Diabetes and Dyslipidemia.  Sonographer:     Harriette Bouillon RDCS Referring Phys:  [id number] RHYNE Diagnosing Phys: Lennie Odor MD PROCEDURE: The transesophogeal probe was passed without difficulty through the esophogus of the patient. Sedation performed by different physician. The patient developed no complications during the procedure.  IMPRESSIONS  1. Small 2.3 mm linear density noted on the lateral commissure (P1 segment) consistent with vegetation. The mitral valve is degenerative. Trivial mitral valve regurgitation. No evidence of mitral stenosis.  2. Intravenous catheter tip noted in the RA with 6.4 mm x 2.8 mm mobile density consistent with vegetation.  3. Left ventricular ejection fraction, by estimation, is 50 to 55%. The left ventricle has low normal function.  4. Right ventricular systolic function is normal. The right ventricular size is normal.  5. No left atrial/left atrial appendage thrombus was detected.  6. The aortic valve is tricuspid. There is mild calcification of the  aortic valve. There is mild thickening of the aortic valve. Aortic valve regurgitation is not visualized. Mild aortic valve stenosis.  7. There is Moderate (Grade III) layered plaque involving the descending aorta. Conclusion(s)/Recommendation(s): Findings are concerning for vegetation/infective endocarditis as detailed above. FINDINGS  Left Ventricle: Left ventricular ejection fraction, by estimation, is 50 to 55%. The left ventricle has low normal function. The left ventricular internal cavity size was normal in size. Right Ventricle: The right ventricular size is normal. No increase in right ventricular wall thickness. Right ventricular systolic function is normal. Left Atrium: Left atrial size was normal in size. No left atrial/left atrial appendage thrombus was detected. Right Atrium: Intravenous catheter tip noted in the RA with 6.4 mm x 2.8 mm mobile density consistent with vegetation. Right atrial size was normal in size. Pericardium: There is no evidence of pericardial effusion. Mitral Valve: Small 2.3 mm linear density noted on the lateral commissure (P1 segment) consistent with vegetation. The mitral valve is degenerative in appearance. There is mild thickening of the anterior and posterior mitral valve leaflet(s). Mild mitral  annular calcification. Trivial mitral valve regurgitation. No evidence of mitral valve stenosis. Tricuspid Valve: The tricuspid valve is grossly normal. Tricuspid valve regurgitation is trivial. No evidence of tricuspid stenosis. There is no evidence of tricuspid valve vegetation. Aortic Valve: The aortic valve is tricuspid. There is mild calcification of the aortic valve. There is mild thickening of the aortic valve. Aortic valve regurgitation is not visualized. Mild aortic stenosis is present. Aortic valve mean gradient measures  3.2 mmHg. Aortic valve peak gradient measures 5.9 mmHg. Aortic valve area, by VTI measures 1.72 cm. Pulmonic Valve: The pulmonic valve was grossly  normal. Pulmonic valve regurgitation is trivial. No evidence of pulmonic stenosis. There is no evidence of pulmonic valve vegetation. Aorta: The aortic root and ascending aorta are structurally normal, with no evidence of dilitation. There is moderate (Grade III) layered plaque involving the descending aorta. Venous: The left upper pulmonary vein, left lower pulmonary vein, right lower pulmonary vein and right upper pulmonary vein are normal. IAS/Shunts: No atrial level shunt detected by color flow Doppler.  LEFT VENTRICLE PLAX 2D LVOT diam:     2.00 cm LV SV:         41 LV SV Index:   23 LVOT  Area:     3.14 cm  AORTIC VALVE AV Area (Vmax):    1.75 cm AV Area (Vmean):   1.83 cm AV Area (VTI):     1.72 cm AV Vmax:           121.42 cm/s AV Vmean:          84.739 cm/s AV VTI:            0.237 m AV Peak Grad:      5.9 mmHg AV Mean Grad:      3.2 mmHg LVOT Vmax:         67.50 cm/s LVOT Vmean:        49.400 cm/s LVOT VTI:          0.130 m LVOT/AV VTI ratio: 0.55  AORTA Ao Root diam: 3.66 cm Ao Asc diam:  3.24 cm  SHUNTS Systemic VTI:  0.13 m Systemic Diam: 2.00 cm Lennie Odor MD Electronically signed by Lennie Odor MD Signature Date/Time: 03/20/2023/3:30:25 PM    Final    DG Chest Port 1 View  Result Date: 03/20/2023 CLINICAL DATA:  Status post dialysis catheter insertion. EXAM: PORTABLE CHEST 1 VIEW COMPARISON:  03/15/2023 FINDINGS: There is a right sided dialysis catheter with tips in the right atrium. No pneumothorax identified. The heart size and mediastinal contours are unremarkable. Lung volumes are low. Asymmetric elevation of right hemidiaphragm. No superimposed pleural effusion, interstitial edema or airspace disease. IMPRESSION: Right-sided dialysis catheter with tips in the right atrium. No pneumothorax. Electronically Signed   By: Signa Kell M.D.   On: 03/20/2023 13:00   EP STUDY  Result Date: 03/20/2023 See surgical note for result.   Medications:  sodium chloride     vancomycin Stopped  (03/20/23 2344)    amLODipine  10 mg Oral q morning   atorvastatin  20 mg Oral q morning   Chlorhexidine Gluconate Cloth  6 each Topical Q0600   Chlorhexidine Gluconate Cloth  6 each Topical Q0600   darbepoetin (ARANESP) injection - DIALYSIS  60 mcg Subcutaneous Q Thu-1800   feeding supplement (NEPRO CARB STEADY)  237 mL Oral TID BM   hydrALAZINE  10 mg Oral Q8H   influenza vac split trivalent PF  0.5 mL Intramuscular Tomorrow-1000   insulin aspart  0-9 Units Subcutaneous TID WC   levothyroxine  25 mcg Oral QAC breakfast   liver oil-zinc oxide   Topical Daily   metoprolol tartrate  50 mg Oral BID   multivitamin with minerals  1 tablet Oral Daily   mupirocin ointment   Nasal BID   pantoprazole  40 mg Oral Daily   polyethylene glycol  17 g Oral Daily   sodium chloride flush  10-40 mL Intracatheter Q12H   sodium chloride flush  3 mL Intravenous Q12H   sorbitol  30 mL Oral Once    Dialysis Orders: [facility],, MWF. 4 hours. EDW 64 kg. LUE AVG 15-gauge. Flow rates: 400/autoflow 1.5. 3K/2.5 calcium. No heparin. Mircera 75 mcg every 2 weeks (last dose 9/11). Venofer 50 mg q. weekly.      Assessment/Plan: 1.  MRSA and E faecalis bacteremia - ID following. On vancomycin x6 weeks with end date 04/25/23.  Concern for seeding of AVG, VVS removed 10/2 with no obvious infection surround it. Temporary cath placed.  TEE today with vegetations noted on mitral valve and temporary dialysis catheter.  Patient will require line holiday.  TDC removed last night.  Per VVS will place [facility] when approved by  ID to do so.  Appreciate VVS & IV team assistance.  ESRD- HD MWF. HD yesterday per regular schedule.  Next HD later this week once cleared by ID for new TDC to be placed. COVID-19 PNA - Completed remdesivir.  Out of isolation.  Hypokalemia - chronic issue, liberalize diet. Using increased K bath. Monitor. Altered mental status - waxing and wanning.  Possible AVG infection- removed on 10/2 w/temp cath  placement. Needs line holiday. See above.  HTN/volume -BP variable,  Hx hypOtension but BP now running high.  Under edw, likely weight loss.  UF as tolerated. Plan for lower dry weight on d/c.  Continue metoprolol and amlodipine, hydralazine added. Anemia of CKD -Hgb drop 8.8, resume ESA . Secondary hyperparathyroidism -placed on binders, corrected calcium and phosphorus at goal Nutrition - Carb modified diet w/Fluid restrictions.  DMT2 - per PMD Debility - chronic issue.  Would benefit from SNF placement but previously has refused. Falling at home per caretaker when discussed at outpatient HD. Cared for by 34 year old mother who does her best but can not pick him up when he falls per her own account.  Agreed to SNF placement!  Virgina Norfolk, PA-C [facility] 03/21/2023,1:28 PM  LOS: 15 days

## 2023-03-21 NOTE — Progress Notes (Signed)
Subjective:  Patient seemed fairly delirious when we examined him   Antibiotics:  Anti-infectives (From admission, onward)    Start     Dose/Rate Route Frequency Ordered Stop   03/16/23 1315  vancomycin (VANCOREADY) IVPB 750 mg/150 mL        750 mg 150 mL/hr over 60 Minutes Intravenous  Once 03/16/23 1226 03/16/23 1504   03/14/23 1030  vancomycin (VANCOREADY) IVPB 750 mg/150 mL        750 mg 150 mL/hr over 60 Minutes Intravenous  Once 03/14/23 0909 03/14/23 1202   03/10/23 1600  vancomycin (VANCOREADY) IVPB 750 mg/150 mL  Status:  Discontinued        750 mg 150 mL/hr over 60 Minutes Intravenous Every M-W-F (Hemodialysis) 03/08/23 1106 03/09/23 0850   03/10/23 1600  vancomycin (VANCOREADY) IVPB 750 mg/150 mL        750 mg 150 mL/hr over 60 Minutes Intravenous Every M-W-F (Hemodialysis) 03/09/23 1037     03/09/23 1600  vancomycin (VANCOREADY) IVPB 750 mg/150 mL        750 mg 150 mL/hr over 60 Minutes Intravenous Every T-Th-Sa (Hemodialysis) 03/08/23 1106 03/10/23 1315   03/08/23 1200  vancomycin (VANCOREADY) IVPB 750 mg/150 mL  Status:  Discontinued        750 mg 150 mL/hr over 60 Minutes Intravenous Every M-W-F (Hemodialysis) 03/07/23 1051 03/08/23 1106   03/07/23 1145  vancomycin (VANCOREADY) IVPB 1750 mg/350 mL        1,750 mg 175 mL/hr over 120 Minutes Intravenous  Once 03/07/23 1051 03/07/23 1617   03/07/23 1000  remdesivir 100 mg in sodium chloride 0.9 % 100 mL IVPB  Status:  Discontinued       Placed in "Followed by" Linked Group   100 mg 200 mL/hr over 30 Minutes Intravenous Daily 03/06/23 1638 03/06/23 1645   03/07/23 1000  remdesivir 100 mg in sodium chloride 0.9 % 100 mL IVPB       Placed in "Followed by" Linked Group   100 mg 200 mL/hr over 30 Minutes Intravenous Daily 03/06/23 1645 03/09/23 1628   03/06/23 1730  remdesivir 100 mg in sodium chloride 0.9 % 100 mL IVPB       Placed in "Followed by" Linked Group   100 mg 200 mL/hr over 30 Minutes  Intravenous Every 30 min 03/06/23 1645 03/06/23 1909   03/06/23 1638  remdesivir 200 mg in sodium chloride 0.9% 250 mL IVPB  Status:  Discontinued       Placed in "Followed by" Linked Group   200 mg 580 mL/hr over 30 Minutes Intravenous Once 03/06/23 1638 03/06/23 1645   03/06/23 1500  cefTRIAXone (ROCEPHIN) 1 g in sodium chloride 0.9 % 100 mL IVPB  Status:  Discontinued        1 g 200 mL/hr over 30 Minutes Intravenous Every 24 hours 03/06/23 1440 03/07/23 1429       Medications: Scheduled Meds:  amLODipine  10 mg Oral q morning   atorvastatin  20 mg Oral q morning   Chlorhexidine Gluconate Cloth  6 each Topical Q0600   Chlorhexidine Gluconate Cloth  6 each Topical Q0600   darbepoetin (ARANESP) injection - DIALYSIS  60 mcg Subcutaneous Q Thu-1800   feeding supplement (NEPRO CARB STEADY)  237 mL Oral TID BM   hydrALAZINE  10 mg Oral Q8H   influenza vac split trivalent PF  0.5 mL Intramuscular Tomorrow-1000   insulin aspart  0-9 Units Subcutaneous TID WC  levothyroxine  25 mcg Oral QAC breakfast   liver oil-zinc oxide   Topical Daily   metoprolol tartrate  50 mg Oral BID   multivitamin with minerals  1 tablet Oral Daily   mupirocin ointment   Nasal BID   pantoprazole  40 mg Oral Daily   polyethylene glycol  17 g Oral Daily   sodium chloride flush  10-40 mL Intracatheter Q12H   sodium chloride flush  3 mL Intravenous Q12H   sorbitol  30 mL Oral Once   Continuous Infusions:  sodium chloride     vancomycin Stopped (03/20/23 2344)   PRN Meds:.sodium chloride, acetaminophen **OR** acetaminophen, haloperidol lactate, HYDROmorphone (DILAUDID) injection, lidocaine (PF), lidocaine-prilocaine, ondansetron **OR** ondansetron (ZOFRAN) IV, mouth rinse, oxyCODONE-acetaminophen, pentafluoroprop-tetrafluoroeth, sodium chloride flush, sodium chloride flush    Objective: Weight change:   Intake/Output Summary (Last 24 hours) at 03/21/2023 1555 Last data filed at 03/21/2023 1300 Gross per 24  hour  Intake 440 ml  Output 2000 ml  Net -1560 ml   Blood pressure (!) 143/66, pulse 67, temperature 98.1 F (36.7 C), resp. rate 20, height 5\' 10"  (1.778 m), weight 60.9 kg, SpO2 99%. Temp:  [97.7 F (36.5 C)-98.1 F (36.7 C)] 98.1 F (36.7 C) (10/08 1550) Pulse Rate:  [64-81] 67 (10/08 1550) Resp:  [10-20] 20 (10/08 1550) BP: (92-174)/(66-95) 143/66 (10/08 1550) SpO2:  [99 %-100 %] 99 % (10/08 1550)  Physical Exam: Physical Exam Constitutional:      Appearance: He is well-developed.  HENT:     Head: Normocephalic and atraumatic.  Eyes:     Conjunctiva/sclera: Conjunctivae normal.  Cardiovascular:     Rate and Rhythm: Normal rate and regular rhythm.  Pulmonary:     Effort: Pulmonary effort is normal. No respiratory distress.     Breath sounds: No wheezing.  Abdominal:     General: There is no distension.     Palpations: Abdomen is soft.  Musculoskeletal:        General: Normal range of motion.     Cervical back: Normal range of motion and neck supple.  Skin:    General: Skin is warm and dry.     Findings: No erythema or rash.  Neurological:     General: No focal deficit present.     Mental Status: He is alert and oriented to person, place, and time.  Psychiatric:        Mood and Affect: Mood normal.        Behavior: Behavior normal.        Thought Content: Thought content normal.        Judgment: Judgment normal.      CBC:    BMET Recent Labs    03/19/23 1450 03/20/23 0534  NA 130* 134*  K 3.3* 3.1*  CL 95* 98  CO2 24 26  GLUCOSE 171* 114*  BUN 21 21  CREATININE 4.17* 4.52*  CALCIUM 7.5* 7.4*     Liver Panel  No results for input(s): "PROT", "ALBUMIN", "AST", "ALT", "ALKPHOS", "BILITOT", "BILIDIR", "IBILI" in the last 72 hours.     Sedimentation Rate No results for input(s): "ESRSEDRATE" in the last 72 hours. C-Reactive Protein No results for input(s): "CRP" in the last 72 hours.  Micro Results: Recent Results (from the past 720  hour(s))  SARS Coronavirus 2 by RT PCR (hospital order, performed in [facility] hospital lab) *cepheid single result test* Anterior Nasal Swab     Status: None   Collection Time: 02/20/23  6:46 PM  Specimen: Anterior Nasal Swab  Result Value Ref Range Status   SARS Coronavirus 2 by RT PCR NEGATIVE NEGATIVE Final    Comment: (NOTE) SARS-CoV-2 target nucleic acids are NOT DETECTED.  The SARS-CoV-2 RNA is generally detectable in upper and lower respiratory specimens during the acute phase of infection. The lowest concentration of SARS-CoV-2 viral copies this assay can detect is 250 copies / mL. A negative result does not preclude SARS-CoV-2 infection and should not be used as the sole basis for treatment or other patient management decisions.  A negative result may occur with improper specimen collection / handling, submission of specimen other than nasopharyngeal swab, presence of viral mutation(s) within the areas targeted by this assay, and inadequate number of viral copies (<250 copies / mL). A negative result must be combined with clinical observations, patient history, and epidemiological information.  Fact Sheet for Patients:   RoadLapTop.co.za  Fact Sheet for Healthcare Providers: http://kim-miller.com/  This test is not yet approved or  cleared by the [place] FDA and has been authorized for detection and/or diagnosis of SARS-CoV-2 by FDA under an Emergency Use Authorization (EUA).  This EUA will remain in effect (meaning this test can be used) for the duration of the COVID-19 declaration under Section 564(b)(1) of the Act, 21 U.S.C. section 360bbb-3(b)(1), unless the authorization is terminated or revoked sooner.  Performed at [facility], [address] [place], [place] [address]   SARS Coronavirus 2 by RT PCR (hospital order, performed in [facility] hospital lab) *cepheid single result test* Anterior Nasal Swab      Status: Abnormal   Collection Time: 03/06/23 10:36 AM   Specimen: Anterior Nasal Swab  Result Value Ref Range Status   SARS Coronavirus 2 by RT PCR POSITIVE (A) NEGATIVE Final    Comment: (NOTE) SARS-CoV-2 target nucleic acids are DETECTED  SARS-CoV-2 RNA is generally detectable in upper respiratory specimens  during the acute phase of infection.  Positive results are indicative  of the presence of the identified virus, but do not rule out bacterial infection or co-infection with other pathogens not detected by the test.  Clinical correlation with patient history and  other diagnostic information is necessary to determine patient infection status.  The expected result is negative.  Fact Sheet for Patients:   RoadLapTop.co.za   Fact Sheet for Healthcare Providers:   http://kim-miller.com/    This test is not yet approved or cleared by the [place] FDA and  has been authorized for detection and/or diagnosis of SARS-CoV-2 by FDA under an Emergency Use Authorization (EUA).  This EUA will remain in effect (meaning this test can be used) for the duration of  the COVID-19 declaration under Section 564(b)(1)  of the Act, 21 U.S.C. section 360-bbb-3(b)(1), unless the authorization is terminated or revoked sooner.   Performed at [facility], [address], [place], [place] [address]   Culture, blood (routine x 2)     Status: Abnormal   Collection Time: 03/06/23 11:35 AM   Specimen: BLOOD  Result Value Ref Range Status   Specimen Description   Final    BLOOD BLOOD RIGHT ARM Performed at [facility], [address] [place], [place] [address]    Special Requests   Final    BOTTLES DRAWN AEROBIC ONLY Blood Culture results may not be optimal due to an inadequate volume of blood received in culture bottles Performed at [facility], [address] [place], [place] [address]    Culture  Setup Time  Final    GRAM POSITIVE COCCI AEROBIC  BOTTLE ONLY Gram Stain Report Called to,Read Back By and Verified With: DILDY,V. ON 03/07/2023 AT 1140 BY FRATTO,A. Performed at [facility], [address] [place], [place] [address]    Culture (A)  Final    STAPHYLOCOCCUS AUREUS SUSCEPTIBILITIES PERFORMED ON PREVIOUS CULTURE WITHIN THE LAST 5 DAYS. STAPHYLOCOCCUS CAPITIS THE SIGNIFICANCE OF ISOLATING THIS ORGANISM FROM A SINGLE SET OF BLOOD CULTURES WHEN MULTIPLE SETS ARE DRAWN IS UNCERTAIN. PLEASE NOTIFY THE MICROBIOLOGY DEPARTMENT WITHIN ONE WEEK IF SPECIATION AND SENSITIVITIES ARE REQUIRED. Performed at [facility], [address] [place], [place] [address]    Report Status 03/10/2023 FINAL  Final  Culture, blood (routine x 2)     Status: Abnormal   Collection Time: 03/06/23 11:35 AM   Specimen: BLOOD  Result Value Ref Range Status   Specimen Description   Final    BLOOD BLOOD LEFT HAND Performed at [facility], [address], [place], [place] [address]    Special Requests   Final    BOTTLES DRAWN AEROBIC ONLY Blood Culture adequate volume Performed at [facility], [address] [place], [place] [address]    Culture  Setup Time   Final    GRAM POSITIVE COCCI AEROBIC BOTTLE ONLY CRITICAL RESULT CALLED TO, READ BACK BY AND VERIFIED WITH: [facility] WEAVER @ 4010 ON 03/07/23 C VARNER CRITICAL RESULT CALLED TO, READ BACK BY AND VERIFIED WITH: Dorann Lodge MENDENHALL [id number] AT 1019 BY CM Performed at [facility], [address], [place], [place] [address]    Culture (A)  Final    METHICILLIN RESISTANT STAPHYLOCOCCUS AUREUS ENTEROCOCCUS FAECALIS    Report Status 03/10/2023 FINAL  Final   Organism ID, Bacteria METHICILLIN RESISTANT STAPHYLOCOCCUS AUREUS  Final   Organism ID, Bacteria ENTEROCOCCUS FAECALIS  Final      Susceptibility   Enterococcus faecalis - MIC*    AMPICILLIN <=2 SENSITIVE Sensitive     VANCOMYCIN 1 SENSITIVE Sensitive     GENTAMICIN SYNERGY SENSITIVE Sensitive     * ENTEROCOCCUS FAECALIS    Methicillin resistant staphylococcus aureus - MIC*    CIPROFLOXACIN >=8 RESISTANT Resistant     ERYTHROMYCIN >=8 RESISTANT Resistant     GENTAMICIN <=0.5 SENSITIVE Sensitive     OXACILLIN >=4 RESISTANT Resistant     TETRACYCLINE <=1 SENSITIVE Sensitive     VANCOMYCIN 1 SENSITIVE Sensitive     TRIMETH/SULFA >=320 RESISTANT Resistant     CLINDAMYCIN <=0.25 SENSITIVE Sensitive     RIFAMPIN <=0.5 SENSITIVE Sensitive     Inducible Clindamycin NEGATIVE Sensitive     LINEZOLID 2 SENSITIVE Sensitive     * METHICILLIN RESISTANT STAPHYLOCOCCUS AUREUS  Blood Culture ID Panel (Reflexed)     Status: Abnormal   Collection Time: 03/06/23 11:35 AM  Result Value Ref Range Status   Enterococcus faecalis DETECTED (A) NOT DETECTED Final    Comment: CRITICAL RESULT CALLED TO, READ BACK BY AND VERIFIED WITH: PHARMD J MENEDENHALL [id number] AT 1019 BY CM    Enterococcus Faecium NOT DETECTED NOT DETECTED Final   Listeria monocytogenes NOT DETECTED NOT DETECTED Final   Staphylococcus species DETECTED (A) NOT DETECTED Final    Comment: CRITICAL RESULT CALLED TO, READ BACK BY AND VERIFIED WITH: PHARMD J MENDENHALL [id number] AT 1019 BY CM    Staphylococcus aureus (BCID) DETECTED (A) NOT DETECTED Final    Comment: Methicillin (oxacillin)-resistant Staphylococcus aureus (MRSA). MRSA is predictably resistant to beta-lactam antibiotics (except ceftaroline). Preferred therapy  is vancomycin unless clinically contraindicated. Patient requires contact precautions if  hospitalized. CRITICAL RESULT CALLED TO, READ BACK BY AND VERIFIED WITH: PHARMD J MENEDENHALL [id number] AT 1019 BY CM    Staphylococcus epidermidis NOT DETECTED NOT DETECTED Final   Staphylococcus lugdunensis NOT DETECTED NOT DETECTED Final   Streptococcus species NOT DETECTED NOT DETECTED Final   Streptococcus agalactiae NOT DETECTED NOT DETECTED Final   Streptococcus pneumoniae NOT DETECTED NOT DETECTED Final   Streptococcus pyogenes NOT DETECTED NOT DETECTED  Final   A.calcoaceticus-baumannii NOT DETECTED NOT DETECTED Final   Bacteroides fragilis NOT DETECTED NOT DETECTED Final   Enterobacterales NOT DETECTED NOT DETECTED Final   Enterobacter cloacae complex NOT DETECTED NOT DETECTED Final   Escherichia coli NOT DETECTED NOT DETECTED Final   Klebsiella aerogenes NOT DETECTED NOT DETECTED Final   Klebsiella oxytoca NOT DETECTED NOT DETECTED Final   Klebsiella pneumoniae NOT DETECTED NOT DETECTED Final   Proteus species NOT DETECTED NOT DETECTED Final   Salmonella species NOT DETECTED NOT DETECTED Final   Serratia marcescens NOT DETECTED NOT DETECTED Final   Haemophilus influenzae NOT DETECTED NOT DETECTED Final   Neisseria meningitidis NOT DETECTED NOT DETECTED Final   Pseudomonas aeruginosa NOT DETECTED NOT DETECTED Final   Stenotrophomonas maltophilia NOT DETECTED NOT DETECTED Final   Candida albicans NOT DETECTED NOT DETECTED Final   Candida auris NOT DETECTED NOT DETECTED Final   Candida glabrata NOT DETECTED NOT DETECTED Final   Candida krusei NOT DETECTED NOT DETECTED Final   Candida parapsilosis NOT DETECTED NOT DETECTED Final   Candida tropicalis NOT DETECTED NOT DETECTED Final   Cryptococcus neoformans/gattii NOT DETECTED NOT DETECTED Final   Meth resistant mecA/C and MREJ DETECTED (A) NOT DETECTED Final    Comment: CRITICAL RESULT CALLED TO, READ BACK BY AND VERIFIED WITH: PHARMD J MENEDENHALL [id number] AT 1019 BY CM    Vancomycin resistance NOT DETECTED NOT DETECTED Final    Comment: Performed at [facility], [address] [place], [place] [address]  MRSA Next Gen by PCR, Nasal     Status: Abnormal   Collection Time: 03/06/23  6:44 PM   Specimen: Nasal Mucosa; Nasal Swab  Result Value Ref Range Status   MRSA by PCR Next Gen DETECTED (A) NOT DETECTED Final    Comment: RESULT CALLED TO, READ BACK BY AND VERIFIED WITH: [facility] BEAVER 2104 03/06/23 BY VIRAY,J (NOTE) The GeneXpert MRSA Assay (FDA approved for NASAL specimens  only), is one component of a comprehensive MRSA colonization surveillance program. It is not intended to diagnose MRSA infection nor to guide or monitor treatment for MRSA infections. Test performance is not FDA approved in patients less than 63 years old. Performed at [facility], [address], [place], [place] [address]   Culture, blood (Routine X 2) w Reflex to ID Panel     Status: Abnormal   Collection Time: 03/07/23 12:08 PM   Specimen: BLOOD  Result Value Ref Range Status   Specimen Description   Final    BLOOD ARTERIAL DRAW Performed at [facility], [address] [place], [place] [address]    Special Requests   Final    BOTTLES DRAWN AEROBIC AND ANAEROBIC Blood Culture results may not be optimal due to an excessive volume of blood received in culture bottles Performed at [facility], [address], [place], [place] [address]    Culture  Setup Time   Final    GRAM POSITIVE COCCI IN BOTH AEROBIC AND ANAEROBIC BOTTLES CRITICAL RESULT  CALLED TO, READ BACK BY AND VERIFIED WITH: K.THOMAS ON 03/08/2023 @14 :54 BY FRATTO,A  CRITICAL VALUE NOTED.  VALUE IS CONSISTENT WITH PREVIOUSLY REPORTED AND CALLED VALUE.    Culture (A)  Final    STAPHYLOCOCCUS AUREUS SUSCEPTIBILITIES PERFORMED ON PREVIOUS CULTURE WITHIN THE LAST 5 DAYS. Performed at [facility], [address], [place], [place] [address]    Report Status 03/10/2023 FINAL  Final  Culture, blood (Routine X 2) w Reflex to ID Panel     Status: Abnormal   Collection Time: 03/07/23 12:24 PM   Specimen: BLOOD  Result Value Ref Range Status   Specimen Description   Final    BLOOD ARTERIAL DRAW Performed at [facility], [address], [place], [place] [address]    Special Requests   Final    BOTTLES DRAWN AEROBIC AND ANAEROBIC Blood Culture results may not be optimal due to an excessive volume of blood received in culture bottles Performed at [facility], [address], [place], [place] [address]    Culture  Setup  Time   Final    GRAM POSITIVE COCCI IN BOTH AEROBIC AND ANAEROBIC BOTTLES CRITICAL RESULT CALLED TO, READ BACK BY AND VERIFIED WITH: B.DILDI ON 03/08/2023 @12 :27 BY T.HAMER CRITICAL VALUE NOTED.  VALUE IS CONSISTENT WITH PREVIOUSLY REPORTED AND CALLED VALUE.    Culture (A)  Final    STAPHYLOCOCCUS AUREUS SUSCEPTIBILITIES PERFORMED ON PREVIOUS CULTURE WITHIN THE LAST 5 DAYS. Performed at [facility], [address] [place], [place] [address]    Report Status 03/10/2023 FINAL  Final  Culture, blood (Routine X 2) w Reflex to ID Panel     Status: None   Collection Time: 03/09/23  3:54 PM   Specimen: BLOOD RIGHT ARM  Result Value Ref Range Status   Specimen Description BLOOD RIGHT ARM  Final   Special Requests   Final    BOTTLES DRAWN AEROBIC ONLY Blood Culture results may not be optimal due to an excessive volume of blood received in culture bottles   Culture   Final    NO GROWTH 5 DAYS Performed at [facility], [address] [place], [place] [address]    Report Status 03/14/2023 FINAL  Final  Culture, blood (Routine X 2) w Reflex to ID Panel     Status: None   Collection Time: 03/09/23  3:54 PM   Specimen: BLOOD RIGHT ARM  Result Value Ref Range Status   Specimen Description BLOOD RIGHT ARM  Final   Special Requests   Final    BOTTLES DRAWN AEROBIC AND ANAEROBIC Blood Culture adequate volume   Culture   Final    NO GROWTH 5 DAYS Performed at [facility], [address] [place], [place] [address]    Report Status 03/14/2023 FINAL  Final  Aerobic/Anaerobic Culture w Gram Stain (surgical/deep wound)     Status: None   Collection Time: 03/15/23  4:02 PM   Specimen: Hemodialysis Graft; Blood  Result Value Ref Range Status   Specimen Description HEMODIALYSIS GRAFT LEFT ARM  Final   Special Requests NONE  Final   Gram Stain   Final    RARE WBC PRESENT, PREDOMINANTLY PMN NO ORGANISMS SEEN    Culture   Final    No growth aerobically or  anaerobically. Performed at [facility], [address] [place], [place] [address]    Report Status 03/20/2023 FINAL  Final    Studies/Results: ECHO TEE  Result Date:  03/20/2023    TRANSESOPHOGEAL ECHO REPORT   Patient Name:   DAJOUR BUFFONE Date of Exam: 03/20/2023 Medical Rec #:  [id number]     Height:       70.0 in Accession #:    [id number]    Weight:       134.3 lb Date of Birth:  [birth year]     BSA:          1.762 m Patient Age:    62 years      BP:           176/92 mmHg Patient Gender: M             HR:           57 bpm. Exam Location:  Inpatient Procedure: 3D Echo, Cardiac Doppler, Color Doppler and Transesophageal Echo Indications:     PHTN and Endocarditis  History:         Patient has prior history of Echocardiogram examinations, most                  recent 03/09/2033. Cardiomyopathy, Pulmonary HTN; Risk                  Factors:Hypertension, Diabetes and Dyslipidemia.  Sonographer:     Harriette Bouillon RDCS Referring Phys:  [id number] RHYNE Diagnosing Phys: Lennie Odor MD PROCEDURE: The transesophogeal probe was passed without difficulty through the esophogus of the patient. Sedation performed by different physician. The patient developed no complications during the procedure.  IMPRESSIONS  1. Small 2.3 mm linear density noted on the lateral commissure (P1 segment) consistent with vegetation. The mitral valve is degenerative. Trivial mitral valve regurgitation. No evidence of mitral stenosis.  2. Intravenous catheter tip noted in the RA with 6.4 mm x 2.8 mm mobile density consistent with vegetation.  3. Left ventricular ejection fraction, by estimation, is 50 to 55%. The left ventricle has low normal function.  4. Right ventricular systolic function is normal. The right ventricular size is normal.  5. No left atrial/left atrial appendage thrombus was detected.  6. The aortic valve is tricuspid. There is mild calcification of the aortic valve. There is mild thickening of the aortic  valve. Aortic valve regurgitation is not visualized. Mild aortic valve stenosis.  7. There is Moderate (Grade III) layered plaque involving the descending aorta. Conclusion(s)/Recommendation(s): Findings are concerning for vegetation/infective endocarditis as detailed above. FINDINGS  Left Ventricle: Left ventricular ejection fraction, by estimation, is 50 to 55%. The left ventricle has low normal function. The left ventricular internal cavity size was normal in size. Right Ventricle: The right ventricular size is normal. No increase in right ventricular wall thickness. Right ventricular systolic function is normal. Left Atrium: Left atrial size was normal in size. No left atrial/left atrial appendage thrombus was detected. Right Atrium: Intravenous catheter tip noted in the RA with 6.4 mm x 2.8 mm mobile density consistent with vegetation. Right atrial size was normal in size. Pericardium: There is no evidence of pericardial effusion. Mitral Valve: Small 2.3 mm linear density noted on the lateral commissure (P1 segment) consistent with vegetation. The mitral valve is degenerative in appearance. There is mild thickening of the anterior and posterior mitral valve leaflet(s). Mild mitral  annular calcification. Trivial mitral valve regurgitation. No evidence of mitral valve stenosis. Tricuspid Valve: The tricuspid valve is grossly normal. Tricuspid valve regurgitation is trivial. No evidence of tricuspid stenosis. There is no evidence of tricuspid valve vegetation. Aortic Valve: The aortic valve  is tricuspid. There is mild calcification of the aortic valve. There is mild thickening of the aortic valve. Aortic valve regurgitation is not visualized. Mild aortic stenosis is present. Aortic valve mean gradient measures  3.2 mmHg. Aortic valve peak gradient measures 5.9 mmHg. Aortic valve area, by VTI measures 1.72 cm. Pulmonic Valve: The pulmonic valve was grossly normal. Pulmonic valve regurgitation is trivial. No  evidence of pulmonic stenosis. There is no evidence of pulmonic valve vegetation. Aorta: The aortic root and ascending aorta are structurally normal, with no evidence of dilitation. There is moderate (Grade III) layered plaque involving the descending aorta. Venous: The left upper pulmonary vein, left lower pulmonary vein, right lower pulmonary vein and right upper pulmonary vein are normal. IAS/Shunts: No atrial level shunt detected by color flow Doppler.  LEFT VENTRICLE PLAX 2D LVOT diam:     2.00 cm LV SV:         41 LV SV Index:   23 LVOT Area:     3.14 cm  AORTIC VALVE AV Area (Vmax):    1.75 cm AV Area (Vmean):   1.83 cm AV Area (VTI):     1.72 cm AV Vmax:           121.42 cm/s AV Vmean:          84.739 cm/s AV VTI:            0.237 m AV Peak Grad:      5.9 mmHg AV Mean Grad:      3.2 mmHg LVOT Vmax:         67.50 cm/s LVOT Vmean:        49.400 cm/s LVOT VTI:          0.130 m LVOT/AV VTI ratio: 0.55  AORTA Ao Root diam: 3.66 cm Ao Asc diam:  3.24 cm  SHUNTS Systemic VTI:  0.13 m Systemic Diam: 2.00 cm Lennie Odor MD Electronically signed by Lennie Odor MD Signature Date/Time: 03/20/2023/3:30:25 PM    Final    DG Chest Port 1 View  Result Date: 03/20/2023 CLINICAL DATA:  Status post dialysis catheter insertion. EXAM: PORTABLE CHEST 1 VIEW COMPARISON:  03/15/2023 FINDINGS: There is a right sided dialysis catheter with tips in the right atrium. No pneumothorax identified. The heart size and mediastinal contours are unremarkable. Lung volumes are low. Asymmetric elevation of right hemidiaphragm. No superimposed pleural effusion, interstitial edema or airspace disease. IMPRESSION: Right-sided dialysis catheter with tips in the right atrium. No pneumothorax. Electronically Signed   By: Signa Kell M.D.   On: 03/20/2023 13:00   EP STUDY  Result Date: 03/20/2023 See surgical note for result.     Assessment/Plan:  INTERVAL HISTORY: sp HD catheter removal   Principal Problem:   Acute  metabolic encephalopathy Active Problems:   Essential hypertension   Neuropathy   Anemia of chronic disease   Hypokalemia   Hypothyroidism   GERD (gastroesophageal reflux disease)   ESRD (end stage renal disease) (HCC)   Protein-calorie malnutrition, severe    [name] [name] is a 62 y.o. male with admitted with MRSA in 2/2 blood cultures on 03/06/23, and E faecalis in 1/2 same blood cultures, then MRSA 2/2 but no E faecalis on 2nd set prior to effective antibiotics then found to have infection status post removal of AV graft last week and placement of temporary HD catheter.  Yesterday when plans were made to place new HD catheter TEE was performed which showed endocarditis involving the mitral valve with a  small linear density on the lateral commissure and mild thickening of the anterior and posterior mitral valve as well as a quite large 6.4 x 2.8 mm density attached to the HD catheter tip.  His HD catheter temporary has been removed this morning.  Plans are for HD catheter holiday prior to placement of a permanent HD catheter.  Blood cultures been taken after HD catheter was removed today.  We will plan on using vancomycin alone for 6 weeks with tomorrow being day 1  Note as far as the enterococcus is concerned I feel we have for sufficient amoutn of data to put the weight of our concern on the MRSA as the primary if not exclusive intravscular pathogen.  The Enterococcus is certainly covered by vancomycin but if we truly thought that the patient had primarily an enterococcal endocarditis we would typically treat with dual beta-lactam therapy.  I do not think that is necessary in this case.  I would hold on placement of permanent HD catheter for as long as possible --at least 2 days  I have personally spent 54 minutes involved in face-to-face and non-face-to-face activities for this patient on the day of the visit. Professional time spent includes the following activities: Preparing to  see the patient (review of tests), Obtaining and/or reviewing separately obtained history (admission/discharge record), Performing a medically appropriate examination and/or evaluation , Ordering medications/tests/procedures, referring and communicating with other health care professionals, Documenting clinical information in the EMR, Independently interpreting results (not separately reported), Communicating results to the patient/family/caregiver, Counseling and educating the patient/family/caregiver and Care coordination (not separately reported).     Delirium: likely multifactorial    LOS: 15 days   Acey Lav 03/21/2023, 3:55 PM

## 2023-03-21 NOTE — Progress Notes (Signed)
Pt. Arrived to this unit fro HD.

## 2023-03-21 NOTE — Progress Notes (Signed)
         Small linear 2.3 mm mobile vegetation attached to the lateral commissure of the P1 segment of the mitral valve consistent valve vegetation.  Dialysis catheter tip in the right atrium with 6.4 mm x 2.8 mm mobile mass consistent with vegetation of the dialysis catheter.  Recommend dialysis and removal of temporary line with "line holiday." Will only place [facility] once ID clears [place] to do so. Please call when ready to be scheduled.   Plan for cath removal per DR. Hawken   [name] PA-C

## 2023-03-22 DIAGNOSIS — I059 Rheumatic mitral valve disease, unspecified: Secondary | ICD-10-CM

## 2023-03-22 LAB — CBC WITH DIFFERENTIAL/PLATELET
Abs Immature Granulocytes: 0.03 10*3/uL (ref 0.00–0.07)
Basophils Absolute: 0 10*3/uL (ref 0.0–0.1)
Basophils Relative: 1 %
Eosinophils Absolute: 0 10*3/uL (ref 0.0–0.5)
Eosinophils Relative: 1 %
HCT: 31.7 % — ABNORMAL LOW (ref 39.0–52.0)
Hemoglobin: 10 g/dL — ABNORMAL LOW (ref 13.0–17.0)
Immature Granulocytes: 1 %
Lymphocytes Relative: 20 %
Lymphs Abs: 1.2 10*3/uL (ref 0.7–4.0)
MCH: 27.9 pg (ref 26.0–34.0)
MCHC: 31.5 g/dL (ref 30.0–36.0)
MCV: 88.5 fL (ref 80.0–100.0)
Monocytes Absolute: 0.3 10*3/uL (ref 0.1–1.0)
Monocytes Relative: 5 %
Neutro Abs: 4.7 10*3/uL (ref 1.7–7.7)
Neutrophils Relative %: 72 %
Platelets: 137 10*3/uL — ABNORMAL LOW (ref 150–400)
RBC: 3.58 MIL/uL — ABNORMAL LOW (ref 4.22–5.81)
RDW: 15 % (ref 11.5–15.5)
WBC: 6.3 10*3/uL (ref 4.0–10.5)
nRBC: 0 % (ref 0.0–0.2)

## 2023-03-22 LAB — BASIC METABOLIC PANEL
Anion gap: 15 (ref 5–15)
BUN: 25 mg/dL — ABNORMAL HIGH (ref 8–23)
CO2: 24 mmol/L (ref 22–32)
Calcium: 8 mg/dL — ABNORMAL LOW (ref 8.9–10.3)
Chloride: 99 mmol/L (ref 98–111)
Creatinine, Ser: 4.85 mg/dL — ABNORMAL HIGH (ref 0.61–1.24)
GFR, Estimated: 13 mL/min — ABNORMAL LOW (ref >60)
Glucose, Bld: 118 mg/dL — ABNORMAL HIGH (ref 70–99)
Potassium: 4.1 mmol/L (ref 3.5–5.1)
Sodium: 138 mmol/L (ref 135–145)

## 2023-03-22 LAB — GLUCOSE, CAPILLARY
Glucose-Capillary: 86 mg/dL (ref 70–99)
Glucose-Capillary: 133 mg/dL — ABNORMAL HIGH (ref 70–99)
Glucose-Capillary: 127 mg/dL — ABNORMAL HIGH (ref 70–99)
Glucose-Capillary: 143 mg/dL — ABNORMAL HIGH (ref 70–99)

## 2023-03-22 NOTE — Progress Notes (Signed)
[facility] Progress Note   Subjective:   Patient seen and examined at bedside. Patient seen and examined at bedside.  No specific complaints.   Objective Vitals:   03/21/23 1550 03/21/23 2124 03/22/23 0514 03/22/23 0917  BP: (!) 143/66 (!) 157/71 (!) 167/75 (!) 164/139  Pulse: 67 66 60 72  Resp: 20 18 18 20   Temp: 98.1 F (36.7 C) 98.2 F (36.8 C) 98.6 F (37 C) 97.9 F (36.6 C)  TempSrc:      SpO2: 99% 100% 98% 100%  Weight:      Height:       Physical Exam General:chronically ill appearing, thin male in NAD Heart:RRR, no mrg Lungs:CTAB, nml WOB on RA Abdomen:soft, NTND Extremities:no LE edema Dialysis Access: none   Filed Weights   03/10/23 1330 03/17/23 1253 03/17/23 1621  Weight: 62 kg 62.9 kg 60.9 kg    Intake/Output Summary (Last 24 hours) at 03/22/2023 1237 Last data filed at 03/22/2023 0800 Gross per 24 hour  Intake 580 ml  Output 0 ml  Net 580 ml    Additional Objective Labs: Basic Metabolic Panel: Recent Labs  Lab 03/16/23 0546 03/19/23 1450 03/20/23 0534 03/22/23 1058  NA 134* 130* 134* 138  K 4.3 3.3* 3.1* 4.1  CL 96* 95* 98 99  CO2 20* 24 26 24   GLUCOSE 149* 171* 114* 118*  BUN 27* 21 21 25*  CREATININE 4.87* 4.17* 4.52* 4.85*  CALCIUM 7.1* 7.5* 7.4* 8.0*  PHOS 4.4  --   --   --    Liver Function Tests: Recent Labs  Lab 03/16/23 0546  ALBUMIN 1.8*   CBC: Recent Labs  Lab 03/16/23 0546 03/19/23 1450 03/20/23 0534 03/22/23 1058  WBC 6.2 7.5 5.6 6.3  NEUTROABS  --   --   --  4.7  HGB 11.7* 10.1* 8.8* 10.0*  HCT 35.9* 30.7* 26.8* 31.7*  MCV 89.8 88.5 89.6 88.5  PLT 127* 151 126* 137*   Blood Culture    Component Value Date/Time   SDES BLOOD RIGHT ARM 03/21/2023 1406   SPECREQUEST  03/21/2023 1406    BOTTLES DRAWN AEROBIC AND ANAEROBIC Blood Culture adequate volume   CULT  03/21/2023 1406    NO GROWTH < 24 HOURS Performed at [facility], [address], [place], [place] [address]    REPTSTATUS  PENDING 03/21/2023 1406    CBG: Recent Labs  Lab 03/21/23 0721 03/21/23 1106 03/21/23 1552 03/22/23 0723 03/22/23 1107  GLUCAP 104* 139* 125* 86 133*    Medications:  sodium chloride     vancomycin Stopped (03/20/23 2344)    amLODipine  10 mg Oral q morning   atorvastatin  20 mg Oral q morning   Chlorhexidine Gluconate Cloth  6 each Topical Q0600   Chlorhexidine Gluconate Cloth  6 each Topical Q0600   darbepoetin (ARANESP) injection - DIALYSIS  60 mcg Subcutaneous Q Thu-1800   feeding supplement (NEPRO CARB STEADY)  237 mL Oral TID BM   hydrALAZINE  10 mg Oral Q8H   influenza vac split trivalent PF  0.5 mL Intramuscular Tomorrow-1000   insulin aspart  0-9 Units Subcutaneous TID WC   levothyroxine  25 mcg Oral QAC breakfast   liver oil-zinc oxide   Topical Daily   metoprolol tartrate  50 mg Oral BID   multivitamin with minerals  1 tablet Oral Daily   mupirocin ointment   Nasal BID   pantoprazole  40 mg Oral Daily   polyethylene glycol  17 g Oral Daily   sodium chloride flush  10-40 mL Intracatheter Q12H   sodium chloride flush  3 mL Intravenous Q12H   sorbitol  30 mL Oral Once    Dialysis Orders: [facility],, MWF. 4 hours. EDW 64 kg. LUE AVG 15-gauge. Flow rates: 400/autoflow 1.5. 3K/2.5 calcium. No heparin. Mircera 75 mcg every 2 weeks (last dose 9/11). Venofer 50 mg q. weekly.      Assessment/Plan: 1.  MRSA and E faecalis bacteremia - ID following. On vancomycin x6 weeks with end date 05/03/23.  Concern for seeding of AVG, VVS removed 10/2 with no obvious infection surround it. Temporary cath placed.  TEE 10/7 with vegetations noted on mitral valve and temporary dialysis catheter.  Patient will require line holiday.  Temp cath removed after HD on 10/8. Per VVS will place [facility] when approved by ID to do so.  Per ID need to wait at least 2 days. Tentative plan to have replaced on Friday.  Appreciate VVS & ID assistance.  ESRD- HD MWF. Last HD Monday.  K 4.1 today.  Next HD  later this week once cleared by ID for new TDC to be placed. COVID-19 PNA - Completed remdesivir.  Out of isolation.  Hypokalemia - chronic issue, liberalize diet. Using increased K bath. Monitor. Altered mental status - waxing and wanning.  Possible AVG infection- removed on 10/2 w/temp cath placement. Needs line holiday. See above.  HTN/volume -BP variable,  Hx hypOtension but BP now running high.  Under edw, likely weight loss.  UF as tolerated. Plan for lower dry weight on d/c.  Continue metoprolol and amlodipine, hydralazine added. Anemia of CKD -Hgb ^10, ESA restarted on 10/3. Secondary hyperparathyroidism -placed on binders, corrected calcium and phosphorus at goal Nutrition - Carb modified diet w/Fluid restrictions.  DMT2 - per PMD Debility - chronic issue.  Would benefit from SNF placement but previously has refused. Falling at home per caretaker when discussed at outpatient HD. Cared for by 58 year old mother who does her best but can not pick him up when he falls per her own account.  Agreed to SNF placement!  Virgina Norfolk, PA-C [facility] 03/22/2023,12:37 PM  LOS: 16 days

## 2023-03-22 NOTE — Progress Notes (Signed)
Physical Therapy Treatment Patient Details Name: [name] [name] MRN: [id number] DOB: 11-02-1960 Today's Date: 03/22/2023   History of Present Illness [name] [name] is a 62 y.o. male who presents to the ED with worsening mental status. He was admitted with acute metabolic encephalopathy secondary to UTI and COVID-pneumonia and also missed hemodialysis sections. He was found to have MRSA and enterococcal bacteremia. He was started on Vancomycin. Patient was transferred to [facility] for fistula removal. 10/2 s/p placement of temporary dialysis catheter (20cm RIJ) using ultrasound and fluoroscopic guidance, harvest of left basilic vein, direct repair of left brachial artery with vein patch angioplasty, excision of left arm arteriovenous graft.  PMH: ESRD on HD MWF, hypertension, type 2 diabetes, pulmonary hypertension, cardiomyopathy, GERD, dyslipidemia, and anemia of CKD.    PT Comments  Patient required maximal encouragement to participate with basic bed level mobility. He continues to require assistance with mobility. Posterior lean with sitting with cues for anterior weight shifting and midline posture. Patient refused to attempt standing despite encouragement, distraction, with perseveration on laying down and returning home. Recommend to continue PT to maximize independence and decrease caregiver burden. Anticipate patient will need continued rehabilitation after this hospital stay < 3 hours/day.     If plan is discharge home, recommend the following: A lot of help with bathing/dressing/bathroom;Assistance with cooking/housework;Help with stairs or ramp for entrance;Assist for transportation;Two people to help with walking and/or transfers;Direct supervision/assist for medications management;Direct supervision/assist for financial management;Supervision due to cognitive status   Can travel by private vehicle     No  Equipment Recommendations  BSC/3in1;Hoyer lift    Recommendations for Other  Services       Precautions / Restrictions Precautions Precautions: Fall Restrictions Weight Bearing Restrictions: No     Mobility  Bed Mobility Overal bed mobility: Needs Assistance Bed Mobility: Supine to Sit, Sit to Supine Rolling: Mod assist   Supine to sit: Mod assist, HOB elevated Sit to supine: Mod assist   General bed mobility comments: maximal cues for initiation and sequencing. assistance for LE and trunk support required    Transfers                   General transfer comment: patient refused to attempt standing, was resistive to placing feet on the floor despite encouragement, distraction, etc. he is perseverating on laying down and going home    Ambulation/Gait                   Stairs             Wheelchair Mobility     Tilt Bed    Modified Rankin (Stroke Patients Only)       Balance Overall balance assessment: Needs assistance Sitting-balance support: Feet supported Sitting balance-Leahy Scale: Good Sitting balance - Comments: posterior lean with cues for anterior weight shifting and maintaining midline while sitting Postural control: Posterior lean                                  Cognition Arousal: Alert Behavior During Therapy: Agitated, Anxious Overall Cognitive Status: No family/caregiver present to determine baseline cognitive functioning Area of Impairment: Safety/judgement, Following commands, Problem solving, Awareness                       Following Commands: Follows one step commands with increased time Safety/Judgement: Decreased awareness of safety, Decreased awareness of deficits Awareness:  Intellectual Problem Solving: Difficulty sequencing, Requires verbal cues, Requires tactile cues General Comments: patient needs maximal encouragement today to participate with therapy efforts. he has limited insight into importance for mobility or physical deficits/need for assistance. he reports  he will have "plenty" of help at home and just wants to go home.        Exercises      General Comments General comments (skin integrity, edema, etc.): patient closes eyes and appears frustrated at end of session      Pertinent Vitals/Pain Pain Assessment Pain Assessment: No/denies pain    Home Living                          Prior Function            PT Goals (current goals can now be found in the care plan section) Acute Rehab PT Goals Patient Stated Goal: to go home PT Goal Formulation: With patient/family Time For Goal Achievement: 04/05/23 Potential to Achieve Goals: Fair Progress towards PT goals: Progressing toward goals (care plan extended, goals appropriate)    Frequency    Min 1X/week      PT Plan      Co-evaluation              AM-PAC PT "6 Clicks" Mobility   Outcome Measure  Help needed turning from your back to your side while in a flat bed without using bedrails?: A Little Help needed moving from lying on your back to sitting on the side of a flat bed without using bedrails?: A Lot Help needed moving to and from a bed to a chair (including a wheelchair)?: A Lot Help needed standing up from a chair using your arms (e.g., wheelchair or bedside chair)?: A Lot Help needed to walk in hospital room?: Total Help needed climbing 3-5 steps with a railing? : Total 6 Click Score: 11    End of Session               Time: 2952-8413 PT Time Calculation (min) (ACUTE ONLY): 11 min  Charges:    $Therapeutic Activity: 8-22 mins PT General Charges $$ ACUTE PT VISIT: 1 Visit                     Donna Bernard, PT, MPT    Ina Homes 03/22/2023, 11:54 AM

## 2023-03-22 NOTE — Progress Notes (Signed)
PROGRESS NOTE    [name] [name]  ZOX:[id number] DOB: 15-Sep-1960 DOA: 03/06/2023 PCP: Farris Has, MD   Brief Narrative: 62 year old with past medical history significant for ESRD on hemodialysis MWF, hypertension, diabetes type 2, pulmonary hypertension, cardiomyopathy, GERD, dyslipidemia, anemia of CKD presents to the ED with worsening mental status.  He was admitted with acute metabolic encephalopathy secondary to UTI and COVID-pneumonia and also missed hemodialysis sections.  He was found to have MRSA and enterococcal bacteremia. He was started on Vancomycin.  ID has been following.  Patient was transferred to [facility] for graft removal on 10/2   Assessment & Plan:   Principal Problem:   Endocarditis of mitral valve Active Problems:   Essential hypertension   Neuropathy   Anemia of chronic disease   Hypokalemia   Hypothyroidism   GERD (gastroesophageal reflux disease)   ESRD (end stage renal disease) (HCC)   Acute metabolic encephalopathy   Protein-calorie malnutrition, severe   MRSA bacteremia   Enterococcal bacteremia   Hemodialysis catheter infection (HCC)   Delirium    MRSA and Enterococcus bacteremia, Mitral Valve Endocarditis, vegetation on [facility] Currently afebrile with no leukocytosis Concern for infection in the AV graft s/p excision of graft 10/2 TEE 10/07: Small linear 2.3 mm mobile vegetation attached to the lateral commissure of the P1 segment of the mitral valve consistent with vegetation.  Dialysis catheter tip in the right atrium with 6.4 mm x 2.8 mm mobile mass consistent with vegetation ID on board, need holiday line.  Hemodialysis catheter removed 10/8 Continue IV vancomycin  COVID infection Tested positive on 9/23 Completed course of Remdesivir Completed steroid   Off isolation since 10/03  ESRD on hemodialysis MWF Managed per nephrology Underwent excision of AV fistula due to infection Temporary HD catheter with vegetation.  Removed 10/08, on  holiday line  Acute metabolic encephalopathy Improved In the setting of UTI/COVID, missed hemodialysis and bacteremia Likely has some cognitive impairment at baseline  Anemia of chronic kidney disease Stable  Hypertension Continue with metoprolol and amlodipine, added hydralazine   Diabetes type 2 Continue with sliding scale insulin  Hypothyroidism Continue with Synthroid  GERD Continue  with PPI  Thrombocytopenia Improving  Hypoalbuminemia On supplements       Nutrition Problem: Severe Malnutrition Etiology: chronic illness    Signs/Symptoms: severe muscle depletion, severe fat depletion    Interventions: Nepro shake  Estimated body mass index is 19.26 kg/m as calculated from the following:   Height as of this encounter: 5\' 10"  (1.778 m).   Weight as of this encounter: 60.9 kg.   DVT prophylaxis: SCDs Code Status: Full code Family Communication: None at bedside Disposition Plan:  Status is: Inpatient Remains inpatient appropriate because: Management of bacteremia.     Consultants:  Nephrology ID Vascular-Dr. Lenell Antu  Procedures:  Graft, TDC removal  Antimicrobials:  Vancomycin  Subjective: Eating lunch, frustrated about being in the hospital, otherwise no new complaints   Objective: Vitals:   03/21/23 2124 03/22/23 0514 03/22/23 0917 03/22/23 1457  BP: (!) 157/71 (!) 167/75 (!) 164/139 (!) 143/76  Pulse: 66 60 72 62  Resp: 18 18 20 20   Temp: 98.2 F (36.8 C) 98.6 F (37 C) 97.9 F (36.6 C) 98.1 F (36.7 C)  TempSrc:      SpO2: 100% 98% 100% 100%  Weight:      Height:        Intake/Output Summary (Last 24 hours) at 03/22/2023 1802 Last data filed at 03/22/2023 1739 Gross per 24 hour  Intake 360 ml  Output 0 ml  Net 360 ml   Filed Weights   03/10/23 1330 03/17/23 1253 03/17/23 1621  Weight: 62 kg 62.9 kg 60.9 kg    Examination: General: NAD, chronically ill appearing  Cardiovascular: S1, S2 present Respiratory:  CTAB Abdomen: Soft, nontender, nondistended, bowel sounds present Musculoskeletal: No bilateral pedal edema noted Skin: Normal Psychiatry: fair mood     Data Reviewed: I have personally reviewed following labs and imaging studies  CBC: Recent Labs  Lab 03/16/23 0546 03/19/23 1450 03/20/23 0534 03/22/23 1058  WBC 6.2 7.5 5.6 6.3  NEUTROABS  --   --   --  4.7  HGB 11.7* 10.1* 8.8* 10.0*  HCT 35.9* 30.7* 26.8* 31.7*  MCV 89.8 88.5 89.6 88.5  PLT 127* 151 126* 137*   Basic Metabolic Panel: Recent Labs  Lab 03/16/23 0546 03/19/23 1450 03/20/23 0534 03/22/23 1058  NA 134* 130* 134* 138  K 4.3 3.3* 3.1* 4.1  CL 96* 95* 98 99  CO2 20* 24 26 24   GLUCOSE 149* 171* 114* 118*  BUN 27* 21 21 25*  CREATININE 4.87* 4.17* 4.52* 4.85*  CALCIUM 7.1* 7.5* 7.4* 8.0*  PHOS 4.4  --   --   --    GFR: Estimated Creatinine Clearance: 13.3 mL/min (A) (by C-G formula based on SCr of 4.85 mg/dL (H)). Liver Function Tests: Recent Labs  Lab 03/16/23 0546  ALBUMIN 1.8*   No results for input(s): "LIPASE", "AMYLASE" in the last 168 hours. No results for input(s): "AMMONIA" in the last 168 hours. Coagulation Profile: No results for input(s): "INR", "PROTIME" in the last 168 hours. Cardiac Enzymes: No results for input(s): "CKTOTAL", "CKMB", "CKMBINDEX", "TROPONINI" in the last 168 hours. BNP (last 3 results) No results for input(s): "PROBNP" in the last 8760 hours. HbA1C: No results for input(s): "HGBA1C" in the last 72 hours. CBG: Recent Labs  Lab 03/21/23 1106 03/21/23 1552 03/22/23 0723 03/22/23 1107 03/22/23 1540  GLUCAP 139* 125* 86 133* 127*   Lipid Profile: No results for input(s): "CHOL", "HDL", "LDLCALC", "TRIG", "CHOLHDL", "LDLDIRECT" in the last 72 hours. Thyroid Function Tests: No results for input(s): "TSH", "T4TOTAL", "FREET4", "T3FREE", "THYROIDAB" in the last 72 hours. Anemia Panel: No results for input(s): "VITAMINB12", "FOLATE", "FERRITIN", "TIBC", "IRON",  "RETICCTPCT" in the last 72 hours. Sepsis Labs: No results for input(s): "PROCALCITON", "LATICACIDVEN" in the last 168 hours.  Recent Results (from the past 240 hour(s))  Aerobic/Anaerobic Culture w Gram Stain (surgical/deep wound)     Status: None   Collection Time: 03/15/23  4:02 PM   Specimen: Hemodialysis Graft; Blood  Result Value Ref Range Status   Specimen Description HEMODIALYSIS GRAFT LEFT ARM  Final   Special Requests NONE  Final   Gram Stain   Final    RARE WBC PRESENT, PREDOMINANTLY PMN NO ORGANISMS SEEN    Culture   Final    No growth aerobically or anaerobically. Performed at [facility], [name] [place], [place] [address]    Report Status 03/20/2023 FINAL  Final  Culture, blood (Routine X 2) w Reflex to ID Panel     Status: None (Preliminary result)   Collection Time: 03/21/23  2:03 PM   Specimen: BLOOD RIGHT HAND  Result Value Ref Range Status   Specimen Description BLOOD RIGHT HAND  Final   Special Requests   Final    BOTTLES DRAWN AEROBIC AND ANAEROBIC Blood Culture adequate volume   Culture   Final  NO GROWTH < 24 HOURS Performed at [facility], [address], [place], [place] [address]    Report Status PENDING  Incomplete  Culture, blood (Routine X 2) w Reflex to ID Panel     Status: None (Preliminary result)   Collection Time: 03/21/23  2:06 PM   Specimen: BLOOD RIGHT ARM  Result Value Ref Range Status   Specimen Description BLOOD RIGHT ARM  Final   Special Requests   Final    BOTTLES DRAWN AEROBIC AND ANAEROBIC Blood Culture adequate volume   Culture   Final    NO GROWTH < 24 HOURS Performed at [facility], [address], [place], [place] [address]    Report Status PENDING  Incomplete         Radiology Studies: No results found.      Scheduled Meds:  amLODipine  10 mg Oral q morning   atorvastatin  20 mg Oral q morning   Chlorhexidine Gluconate Cloth  6 each Topical Q0600   Chlorhexidine Gluconate  Cloth  6 each Topical Q0600   darbepoetin (ARANESP) injection - DIALYSIS  60 mcg Subcutaneous Q Thu-1800   feeding supplement (NEPRO CARB STEADY)  237 mL Oral TID BM   hydrALAZINE  10 mg Oral Q8H   influenza vac split trivalent PF  0.5 mL Intramuscular Tomorrow-1000   insulin aspart  0-9 Units Subcutaneous TID WC   levothyroxine  25 mcg Oral QAC breakfast   liver oil-zinc oxide   Topical Daily   metoprolol tartrate  50 mg Oral BID   multivitamin with minerals  1 tablet Oral Daily   mupirocin ointment   Nasal BID   pantoprazole  40 mg Oral Daily   polyethylene glycol  17 g Oral Daily   sodium chloride flush  10-40 mL Intracatheter Q12H   sodium chloride flush  3 mL Intravenous Q12H   sorbitol  30 mL Oral Once   Continuous Infusions:  sodium chloride     vancomycin Stopped (03/20/23 2344)     LOS: 16 days      Briant Cedar, MD [facility]   If 7PM-7AM, please contact night-coverage [email or web address]  03/22/2023, 6:02 PM

## 2023-03-23 ENCOUNTER — Ambulatory Visit (HOSPITAL_COMMUNITY): Admission: RE | Admit: 2023-03-23 | Payer: Medicare HMO | Source: Home / Self Care

## 2023-03-23 ENCOUNTER — Encounter (HOSPITAL_COMMUNITY): Admission: RE | Payer: Self-pay | Source: Home / Self Care

## 2023-03-23 DIAGNOSIS — I059 Rheumatic mitral valve disease, unspecified: Secondary | ICD-10-CM | POA: Diagnosis not present

## 2023-03-23 LAB — BASIC METABOLIC PANEL
Anion gap: 15 (ref 5–15)
BUN: 32 mg/dL — ABNORMAL HIGH (ref 8–23)
CO2: 23 mmol/L (ref 22–32)
Calcium: 8.6 mg/dL — ABNORMAL LOW (ref 8.9–10.3)
Chloride: 101 mmol/L (ref 98–111)
Creatinine, Ser: 5.67 mg/dL — ABNORMAL HIGH (ref 0.61–1.24)
GFR, Estimated: 10 mL/min — ABNORMAL LOW (ref >60)
Glucose, Bld: 128 mg/dL — ABNORMAL HIGH (ref 70–99)
Potassium: 3.9 mmol/L (ref 3.5–5.1)
Sodium: 139 mmol/L (ref 135–145)

## 2023-03-23 LAB — CBC WITH DIFFERENTIAL/PLATELET
Abs Immature Granulocytes: 0.03 10*3/uL (ref 0.00–0.07)
Basophils Absolute: 0 10*3/uL (ref 0.0–0.1)
Basophils Relative: 1 %
Eosinophils Absolute: 0 10*3/uL (ref 0.0–0.5)
Eosinophils Relative: 0 %
HCT: 32.7 % — ABNORMAL LOW (ref 39.0–52.0)
Hemoglobin: 10.5 g/dL — ABNORMAL LOW (ref 13.0–17.0)
Immature Granulocytes: 0 %
Lymphocytes Relative: 24 %
Lymphs Abs: 1.7 10*3/uL (ref 0.7–4.0)
MCH: 29.2 pg (ref 26.0–34.0)
MCHC: 32.1 g/dL (ref 30.0–36.0)
MCV: 91.1 fL (ref 80.0–100.0)
Monocytes Absolute: 0.4 10*3/uL (ref 0.1–1.0)
Monocytes Relative: 5 %
Neutro Abs: 4.9 10*3/uL (ref 1.7–7.7)
Neutrophils Relative %: 70 %
Platelets: 167 10*3/uL (ref 150–400)
RBC: 3.59 MIL/uL — ABNORMAL LOW (ref 4.22–5.81)
RDW: 15.1 % (ref 11.5–15.5)
WBC: 7.1 10*3/uL (ref 4.0–10.5)
nRBC: 0 % (ref 0.0–0.2)

## 2023-03-23 LAB — GLUCOSE, CAPILLARY
Glucose-Capillary: 118 mg/dL — ABNORMAL HIGH (ref 70–99)
Glucose-Capillary: 144 mg/dL — ABNORMAL HIGH (ref 70–99)
Glucose-Capillary: 133 mg/dL — ABNORMAL HIGH (ref 70–99)
Glucose-Capillary: 112 mg/dL — ABNORMAL HIGH (ref 70–99)

## 2023-03-23 SURGERY — COLONOSCOPY WITH PROPOFOL
Anesthesia: Monitor Anesthesia Care

## 2023-03-23 NOTE — Progress Notes (Signed)
Pt. Refusing all oral meds today, pt. Re-approached on numerous occasions and cont. To refuse, educated about importance of taking medications, pt. Oriented x 1, MD aware

## 2023-03-23 NOTE — Progress Notes (Signed)
[facility] Progress Note   Subjective:   Patient seen and examined at bedside.  Tired of being here, wants to go home.  Reminded of plan and fact he does not currently have dialysis access.  No other specific complaints. Denies CP, SOB, abdominal pain, n/v/d and dizziness.  Objective Vitals:   03/22/23 1457 03/22/23 2145 03/23/23 0512 03/23/23 0806  BP: (!) 143/76 (!) 167/89 (!) 162/79 (!) 168/91  Pulse: 62 68 62 66  Resp: 20 18  18   Temp: 98.1 F (36.7 C) 98.5 F (36.9 C) 98.1 F (36.7 C) 97.7 F (36.5 C)  TempSrc:   Oral Oral  SpO2: 100% 99% 100% 100%  Weight:      Height:       Physical Exam General:chronically ill appearing, thin male in NAD Heart:RRR, no mrg Lungs:CTAB, nml WOB on RA Abdomen:soft, NTND Extremities:no LE edema Dialysis Access: none  Filed Weights   03/10/23 1330 03/17/23 1253 03/17/23 1621  Weight: 62 kg 62.9 kg 60.9 kg    Intake/Output Summary (Last 24 hours) at 03/23/2023 1436 Last data filed at 03/22/2023 2145 Gross per 24 hour  Intake 120 ml  Output 0 ml  Net 120 ml    Additional Objective Labs: Basic Metabolic Panel: Recent Labs  Lab 03/20/23 0534 03/22/23 1058 03/23/23 0917  NA 134* 138 139  K 3.1* 4.1 3.9  CL 98 99 101  CO2 26 24 23   GLUCOSE 114* 118* 128*  BUN 21 25* 32*  CREATININE 4.52* 4.85* 5.67*  CALCIUM 7.4* 8.0* 8.6*   CBC: Recent Labs  Lab 03/19/23 1450 03/20/23 0534 03/22/23 1058 03/23/23 0917  WBC 7.5 5.6 6.3 7.1  NEUTROABS  --   --  4.7 4.9  HGB 10.1* 8.8* 10.0* 10.5*  HCT 30.7* 26.8* 31.7* 32.7*  MCV 88.5 89.6 88.5 91.1  PLT 151 126* 137* 167   Blood Culture    Component Value Date/Time   SDES BLOOD RIGHT ARM 03/21/2023 1406   SPECREQUEST  03/21/2023 1406    BOTTLES DRAWN AEROBIC AND ANAEROBIC Blood Culture adequate volume   CULT  03/21/2023 1406    NO GROWTH < 24 HOURS Performed at [facility], [address], [place], [place] [address]    REPTSTATUS PENDING 03/21/2023  1406    Medications:  sodium chloride     vancomycin Stopped (03/20/23 2344)    amLODipine  10 mg Oral q morning   atorvastatin  20 mg Oral q morning   Chlorhexidine Gluconate Cloth  6 each Topical Q0600   Chlorhexidine Gluconate Cloth  6 each Topical Q0600   darbepoetin (ARANESP) injection - DIALYSIS  60 mcg Subcutaneous Q Thu-1800   feeding supplement (NEPRO CARB STEADY)  237 mL Oral TID BM   hydrALAZINE  10 mg Oral Q8H   influenza vac split trivalent PF  0.5 mL Intramuscular Tomorrow-1000   insulin aspart  0-9 Units Subcutaneous TID WC   levothyroxine  25 mcg Oral QAC breakfast   liver oil-zinc oxide   Topical Daily   metoprolol tartrate  50 mg Oral BID   multivitamin with minerals  1 tablet Oral Daily   mupirocin ointment   Nasal BID   pantoprazole  40 mg Oral Daily   polyethylene glycol  17 g Oral Daily   sodium chloride flush  10-40 mL Intracatheter Q12H   sodium chloride flush  3 mL Intravenous Q12H   sorbitol  30 mL Oral Once    Dialysis Orders: [facility],, MWF.  4 hours. EDW 64 kg. LUE AVG 15-gauge. Flow rates: 400/autoflow 1.5. 3K/2.5 calcium. No heparin. Mircera 75 mcg every 2 weeks (last dose 9/11). Venofer 50 mg q. weekly.      Assessment/Plan: 1.  MRSA and E faecalis bacteremia - ID following. On vancomycin x6 weeks with end date 05/03/23.  Concern for seeding of AVG, VVS removed 10/2 with no obvious infection surround it. Temporary cath placed.  TEE 10/7 with vegetations noted on mitral valve and temporary dialysis catheter.  Patient will require line holiday.  Temp cath removed after HD on 10/8. Per VVS will place [facility] when approved by ID to do so.  Per ID need to wait at least 2 days. Tentative plan to have replaced on Friday or Sat.  Appreciate VVS & ID assistance.  ESRD- HD MWF. Last HD Monday.  K 3.9 today.  Next HD later this week once cleared by ID for new TDC to be placed.  No acute indications that HD needed today. COVID-19 PNA - Completed remdesivir.  Out of  isolation.  Hypokalemia - chronic issue, liberalize diet. UStable. Monitor. Altered mental status - waxing and wanning.  Possible AVG infection- removed on 10/2 w/temp cath placement. Needs line holiday. See above.  HTN/volume -BP elevated. Hx hypOtension but BP now running high.  Under edw, likely weight loss.  UF as tolerated. Plan for lower dry weight on d/c.  Continue metoprolol and amlodipine, hydralazine added 10/8. Anemia of CKD -Hgb ^10.5, ESA restarted on 10/3. Secondary hyperparathyroidism -placed on binders, corrected calcium and phosphorus at goal Nutrition - Carb modified diet w/Fluid restrictions.  DMT2 - per PMD Debility - chronic issue.  Would benefit from SNF placement but previously has refused. Falling at home per caretaker when discussed at outpatient HD. Cared for by 33 year old mother who does her best but can not pick him up when he falls per her own account.  Agreed to SNF placement!  Virgina Norfolk, PA-C [facility] 03/23/2023,2:36 PM  LOS: 17 days

## 2023-03-23 NOTE — Progress Notes (Signed)
PROGRESS NOTE    [name] [name]  BMW:[id number] DOB: 1961-03-19 DOA: 03/06/2023 PCP: Farris Has, MD   Brief Narrative: 62 year old with past medical history significant for ESRD on hemodialysis MWF, hypertension, diabetes type 2, pulmonary hypertension, cardiomyopathy, GERD, dyslipidemia, anemia of CKD presents to the ED with worsening mental status.  He was admitted with acute metabolic encephalopathy secondary to UTI and COVID-pneumonia and also missed hemodialysis sections.  He was found to have MRSA and enterococcal bacteremia. He was started on Vancomycin.  ID has been following.  Patient was transferred to [facility] for graft removal on 10/2      Assessment & Plan:   Principal Problem:   Endocarditis of mitral valve Active Problems:   Essential hypertension   Neuropathy   Anemia of chronic disease   Hypokalemia   Hypothyroidism   GERD (gastroesophageal reflux disease)   ESRD (end stage renal disease) (HCC)   Acute metabolic encephalopathy   Protein-calorie malnutrition, severe   MRSA bacteremia   Enterococcal bacteremia   Hemodialysis catheter infection (HCC)   Delirium    MRSA and Enterococcus bacteremia, Mitral Valve Endocarditis, vegetation on [facility] Currently afebrile with no leukocytosis Concern for infection in the AV graft s/p excision of graft 10/2 TEE 10/07: Small linear 2.3 mm mobile vegetation attached to the lateral commissure of the P1 segment of the mitral valve consistent with vegetation.  Dialysis catheter tip in the right atrium with 6.4 mm x 2.8 mm mobile mass consistent with vegetation ID on board, need holiday line.  Hemodialysis catheter removed 10/8 Continue IV vancomycin  COVID infection Tested positive on 9/23 Completed course of Remdesivir Completed steroid   Off isolation since 10/03  ESRD on hemodialysis MWF Managed per nephrology Underwent excision of AV fistula due to infection Temporary HD catheter with vegetation.  Removed 10/08,  on holiday line  Acute metabolic encephalopathy Improved In the setting of UTI/COVID, missed hemodialysis and bacteremia Likely has some cognitive impairment at baseline  Anemia of chronic kidney disease Stable  Hypertension Continue with metoprolol and amlodipine, added hydralazine   Diabetes type 2 Continue with sliding scale insulin  Hypothyroidism Continue with Synthroid  GERD Continue  with PPI  Thrombocytopenia Improving  Hypoalbuminemia On supplements       Nutrition Problem: Severe Malnutrition Etiology: chronic illness    Signs/Symptoms: severe muscle depletion, severe fat depletion    Interventions: Nepro shake  Estimated body mass index is 19.26 kg/m as calculated from the following:   Height as of this encounter: 5\' 10"  (1.778 m).   Weight as of this encounter: 60.9 kg.   DVT prophylaxis: SCDs Code Status: Full code Family Communication: None at bedside Disposition Plan:  Status is: Inpatient Remains inpatient appropriate because: Management of bacteremia.     Consultants:  Nephrology ID Vascular  Procedures:  Graft, TDC removal  Antimicrobials:  Vancomycin  Subjective: Frustrated about staying in the hospital. Refused his meds today.    Objective: Vitals:   03/22/23 2145 03/23/23 0512 03/23/23 0806 03/23/23 1503  BP: (!) 167/89 (!) 162/79 (!) 168/91 (!) 165/84  Pulse: 68 62 66 80  Resp: 18  18 18   Temp: 98.5 F (36.9 C) 98.1 F (36.7 C) 97.7 F (36.5 C) 98 F (36.7 C)  TempSrc:  Oral Oral Oral  SpO2: 99% 100% 100% 100%  Weight:      Height:        Intake/Output Summary (Last 24 hours) at 03/23/2023 1538 Last data filed at 03/22/2023 2145 Gross per 24  hour  Intake 120 ml  Output 0 ml  Net 120 ml   Filed Weights   03/10/23 1330 03/17/23 1253 03/17/23 1621  Weight: 62 kg 62.9 kg 60.9 kg    Examination: General: NAD, chronically ill appearing  Cardiovascular: S1, S2 present Respiratory: CTAB Abdomen:  Soft, nontender, nondistended, bowel sounds present Musculoskeletal: No bilateral pedal edema noted Skin: Normal Psychiatry: fair mood     Data Reviewed: I have personally reviewed following labs and imaging studies  CBC: Recent Labs  Lab 03/19/23 1450 03/20/23 0534 03/22/23 1058 03/23/23 0917  WBC 7.5 5.6 6.3 7.1  NEUTROABS  --   --  4.7 4.9  HGB 10.1* 8.8* 10.0* 10.5*  HCT 30.7* 26.8* 31.7* 32.7*  MCV 88.5 89.6 88.5 91.1  PLT 151 126* 137* 167   Basic Metabolic Panel: Recent Labs  Lab 03/19/23 1450 03/20/23 0534 03/22/23 1058 03/23/23 0917  NA 130* 134* 138 139  K 3.3* 3.1* 4.1 3.9  CL 95* 98 99 101  CO2 24 26 24 23   GLUCOSE 171* 114* 118* 128*  BUN 21 21 25* 32*  CREATININE 4.17* 4.52* 4.85* 5.67*  CALCIUM 7.5* 7.4* 8.0* 8.6*   GFR: Estimated Creatinine Clearance: 11.3 mL/min (A) (by C-G formula based on SCr of 5.67 mg/dL (H)). Liver Function Tests: No results for input(s): "AST", "ALT", "ALKPHOS", "BILITOT", "PROT", "ALBUMIN" in the last 168 hours.  No results for input(s): "LIPASE", "AMYLASE" in the last 168 hours. No results for input(s): "AMMONIA" in the last 168 hours. Coagulation Profile: No results for input(s): "INR", "PROTIME" in the last 168 hours. Cardiac Enzymes: No results for input(s): "CKTOTAL", "CKMB", "CKMBINDEX", "TROPONINI" in the last 168 hours. BNP (last 3 results) No results for input(s): "PROBNP" in the last 8760 hours. HbA1C: No results for input(s): "HGBA1C" in the last 72 hours. CBG: Recent Labs  Lab 03/22/23 1107 03/22/23 1540 03/22/23 2147 03/23/23 0736 03/23/23 1242  GLUCAP 133* 127* 143* 118* 144*   Lipid Profile: No results for input(s): "CHOL", "HDL", "LDLCALC", "TRIG", "CHOLHDL", "LDLDIRECT" in the last 72 hours. Thyroid Function Tests: No results for input(s): "TSH", "T4TOTAL", "FREET4", "T3FREE", "THYROIDAB" in the last 72 hours. Anemia Panel: No results for input(s): "VITAMINB12", "FOLATE", "FERRITIN", "TIBC",  "IRON", "RETICCTPCT" in the last 72 hours. Sepsis Labs: No results for input(s): "PROCALCITON", "LATICACIDVEN" in the last 168 hours.  Recent Results (from the past 240 hour(s))  Aerobic/Anaerobic Culture w Gram Stain (surgical/deep wound)     Status: None   Collection Time: 03/15/23  4:02 PM   Specimen: Hemodialysis Graft; Blood  Result Value Ref Range Status   Specimen Description HEMODIALYSIS GRAFT LEFT ARM  Final   Special Requests NONE  Final   Gram Stain   Final    RARE WBC PRESENT, PREDOMINANTLY PMN NO ORGANISMS SEEN    Culture   Final    No growth aerobically or anaerobically. Performed at [facility], [address], [place], [place] [address]    Report Status 03/20/2023 FINAL  Final  Culture, blood (Routine X 2) w Reflex to ID Panel     Status: None (Preliminary result)   Collection Time: 03/21/23  2:03 PM   Specimen: BLOOD RIGHT HAND  Result Value Ref Range Status   Specimen Description BLOOD RIGHT HAND  Final   Special Requests   Final    BOTTLES DRAWN AEROBIC AND ANAEROBIC Blood Culture adequate volume   Culture   Final    NO GROWTH 2 DAYS Performed at [facility], [address] [place], [place] [address]    Report Status PENDING  Incomplete  Culture, blood (Routine X 2) w Reflex to ID Panel     Status: None (Preliminary result)   Collection Time: 03/21/23  2:06 PM   Specimen: BLOOD RIGHT ARM  Result Value Ref Range Status   Specimen Description BLOOD RIGHT ARM  Final   Special Requests   Final    BOTTLES DRAWN AEROBIC AND ANAEROBIC Blood Culture adequate volume   Culture   Final    NO GROWTH 2 DAYS Performed at [facility], [address], [place], [place] [address]    Report Status PENDING  Incomplete         Radiology Studies: No results found.      Scheduled Meds:  amLODipine  10 mg Oral q morning   atorvastatin  20 mg Oral q morning   Chlorhexidine Gluconate Cloth  6 each Topical Q0600   Chlorhexidine Gluconate  Cloth  6 each Topical Q0600   darbepoetin (ARANESP) injection - DIALYSIS  60 mcg Subcutaneous Q Thu-1800   feeding supplement (NEPRO CARB STEADY)  237 mL Oral TID BM   hydrALAZINE  10 mg Oral Q8H   influenza vac split trivalent PF  0.5 mL Intramuscular Tomorrow-1000   insulin aspart  0-9 Units Subcutaneous TID WC   levothyroxine  25 mcg Oral QAC breakfast   liver oil-zinc oxide   Topical Daily   metoprolol tartrate  50 mg Oral BID   multivitamin with minerals  1 tablet Oral Daily   mupirocin ointment   Nasal BID   pantoprazole  40 mg Oral Daily   polyethylene glycol  17 g Oral Daily   sodium chloride flush  10-40 mL Intracatheter Q12H   sorbitol  30 mL Oral Once   Continuous Infusions:  vancomycin Stopped (03/20/23 2344)     LOS: 17 days      Briant Cedar, MD [facility]   If 7PM-7AM, please contact night-coverage [email or web address]  03/23/2023, 3:38 PM

## 2023-03-23 NOTE — Progress Notes (Signed)
Occupational Therapy Treatment Patient Details Name: [name] [name] MRN: [id number] DOB: [birth year] Today's Date: 03/23/2023   History of present illness Isaih [name] is a 62 y.o. male who presents to the ED with worsening mental status. He was admitted with acute metabolic encephalopathy secondary to UTI and COVID-pneumonia and also missed hemodialysis sections. He was found to have MRSA and enterococcal bacteremia. He was started on Vancomycin. Patient was transferred to [facility] for fistula removal. 10/2 s/p placement of temporary dialysis catheter (20cm RIJ) using ultrasound and fluoroscopic guidance, harvest of left basilic vein, direct repair of left brachial artery with vein patch angioplasty, excision of left arm arteriovenous graft.  PMH: ESRD on HD MWF, hypertension, type 2 diabetes, pulmonary hypertension, cardiomyopathy, GERD, dyslipidemia, and anemia of CKD.   OT comments  Pt required max encouragement for participation today. Pt's mother present during session, assisted with motivating Pt to participate, repeatedly refused OOB activities, including bed mobility, transfers, toileting, refused ADLs. Pt eventually agreeable to BLE exercises at bed level to improve strength needed to complete transfers. Pt displays decreased problem solving, decreased ability to follow commands, repeatedly states he wants to go home and asks for mother in room. Mother states Pt has not been ambulatory for months, performing transfers to [facility] and to electric w/c for mobility around home. Pt would benefit from continued skilled OT to maximize participation, DC to postacute still appropriate.       If plan is discharge home, recommend the following:  A little help with walking and/or transfers;A little help with bathing/dressing/bathroom;Assistance with cooking/housework;Direct supervision/assist for medications management;Direct supervision/assist for financial management;Assist for transportation;Help with  stairs or ramp for entrance;Supervision due to cognitive status   Equipment Recommendations  Other (comment) (defer)    Recommendations for Other Services      Precautions / Restrictions Precautions Precautions: Fall Precaution Comments: history of falls, Contact precs Restrictions Weight Bearing Restrictions: No       Mobility Bed Mobility Overal bed mobility: Needs Assistance             General bed mobility comments: refused bed mobility    Transfers Overall transfer level: Needs assistance                 General transfer comment: refused     Balance                                           ADL either performed or assessed with clinical judgement   ADL Overall ADL's : Needs assistance/impaired                                       General ADL Comments: refuses ADLs    Extremity/Trunk Assessment Upper Extremity Assessment Upper Extremity Assessment: Generalized weakness RUE Deficits / Details: stiffness in R shoulder, limited overhead reach. RUE Sensation: WNL RUE Coordination: decreased gross motor            Vision       Perception     Praxis      Cognition Arousal: Alert Behavior During Therapy: Agitated, Anxious Overall Cognitive Status: History of cognitive impairments - at baseline Area of Impairment: Safety/judgement, Following commands, Problem solving, Awareness  Memory: Decreased recall of precautions, Decreased short-term memory Following Commands: Follows one step commands with increased time Safety/Judgement: Decreased awareness of safety, Decreased awareness of deficits Awareness: Intellectual Problem Solving: Difficulty sequencing, Requires verbal cues, Requires tactile cues General Comments: Pt requires maximum encouragement for participation, continues to ask for mother and to go home throughout session, anxious/agitated        Exercises Other  Exercises Other Exercises: BLE exercises with therapist HH resistance Other Exercises: AROM BLE exercises at bed level    Shoulder Instructions       General Comments      Pertinent Vitals/ Pain       Pain Assessment Pain Assessment: No/denies pain  Home Living                                          Prior Functioning/Environment              Frequency  Min 1X/week        Progress Toward Goals  OT Goals(current goals can now be found in the care plan section)  Progress towards OT goals: Not progressing toward goals - comment (limited participation in therapy due to fatigue, agitation, possible cognitive factors limiting participation)  Acute Rehab OT Goals Patient Stated Goal: unable to participate in goal setting OT Goal Formulation: Patient unable to participate in goal setting Time For Goal Achievement: 03/28/23 Potential to Achieve Goals: Fair ADL Goals Pt Will Perform Lower Body Dressing: with set-up;sit to/from stand Pt Will Transfer to Toilet: with supervision;ambulating Pt Will Perform Toileting - Clothing Manipulation and hygiene: with set-up;with supervision;sit to/from stand Additional ADL Goal #1: Pt will be able to complete bed mobility with mod I to maximize independence, reduce caregive burden, and prepare for transfers/ADLs.  Plan      Co-evaluation                 AM-PAC OT "6 Clicks" Daily Activity     Outcome Measure   Help from another person eating meals?: A Little Help from another person taking care of personal grooming?: A Little Help from another person toileting, which includes using toliet, bedpan, or urinal?: Total Help from another person bathing (including washing, rinsing, drying)?: A Lot Help from another person to put on and taking off regular upper body clothing?: A Little Help from another person to put on and taking off regular lower body clothing?: A Lot 6 Click Score: 14    End of Session     OT Visit Diagnosis: Unsteadiness on feet (R26.81);Other abnormalities of gait and mobility (R26.89);Muscle weakness (generalized) (M62.81);Other symptoms and signs involving cognitive function   Activity Tolerance Patient limited by fatigue;Treatment limited secondary to agitation   Patient Left in bed;with call bell/phone within reach;with bed alarm set;with family/visitor present   Nurse Communication Mobility status        Time: 2130-8657 OT Time Calculation (min): 19 min  Charges: OT General Charges $OT Visit: 1 Visit OT Treatments $Therapeutic Exercise: 8-22 mins  Keyanna Sandefer, OTR/L   Sadaf Przybysz R Taeya Theall 03/23/2023, 1:42 PM

## 2023-03-23 NOTE — Plan of Care (Signed)
  Problem: Skin Integrity: Goal: Risk for impaired skin integrity will decrease Outcome: Progressing   Problem: Education: Goal: Ability to describe self-care measures that may prevent or decrease complications (Diabetes Survival Skills Education) will improve Outcome: Progressing

## 2023-03-23 NOTE — Plan of Care (Signed)
  Problem: Education: Goal: Knowledge of General Education information will improve Description: Including pain rating scale, medication(s)/side effects and non-pharmacologic comfort measures Outcome: Progressing   Problem: Health Behavior/Discharge Planning: Goal: Ability to manage health-related needs will improve Outcome: Progressing   Problem: Clinical Measurements: Goal: Ability to maintain clinical measurements within normal limits will improve Outcome: Progressing Goal: Will remain free from infection Outcome: Progressing Goal: Diagnostic test results will improve Outcome: Progressing Goal: Respiratory complications will improve Outcome: Progressing Goal: Cardiovascular complication will be avoided Outcome: Progressing   Problem: Activity: Goal: Risk for activity intolerance will decrease Outcome: Progressing   Problem: Nutrition: Goal: Adequate nutrition will be maintained Outcome: Progressing   Problem: Coping: Goal: Level of anxiety will decrease Outcome: Progressing   Problem: Elimination: Goal: Will not experience complications related to bowel motility Outcome: Progressing Goal: Will not experience complications related to urinary retention Outcome: Progressing   Problem: Pain Managment: Goal: General experience of comfort will improve Outcome: Progressing   Problem: Safety: Goal: Ability to remain free from injury will improve Outcome: Progressing   Problem: Skin Integrity: Goal: Risk for impaired skin integrity will decrease Outcome: Progressing   Problem: Education: Goal: Knowledge of risk factors and measures for prevention of condition will improve Outcome: Progressing   Problem: Coping: Goal: Psychosocial and spiritual needs will be supported Outcome: Progressing   Problem: Respiratory: Goal: Will maintain a patent airway Outcome: Progressing Goal: Complications related to the disease process, condition or treatment will be avoided or  minimized Outcome: Progressing   Problem: Education: Goal: Ability to describe self-care measures that may prevent or decrease complications (Diabetes Survival Skills Education) will improve Outcome: Progressing Goal: Individualized Educational Video(s) Outcome: Progressing   Problem: Coping: Goal: Ability to adjust to condition or change in health will improve Outcome: Progressing   Problem: Fluid Volume: Goal: Ability to maintain a balanced intake and output will improve Outcome: Progressing   Problem: Health Behavior/Discharge Planning: Goal: Ability to identify and utilize available resources and services will improve Outcome: Progressing Goal: Ability to manage health-related needs will improve Outcome: Progressing   Problem: Metabolic: Goal: Ability to maintain appropriate glucose levels will improve Outcome: Progressing   Problem: Nutritional: Goal: Maintenance of adequate nutrition will improve Outcome: Progressing Goal: Progress toward achieving an optimal weight will improve Outcome: Progressing   Problem: Skin Integrity: Goal: Risk for impaired skin integrity will decrease Outcome: Progressing   Problem: Tissue Perfusion: Goal: Adequacy of tissue perfusion will improve Outcome: Progressing   Problem: Education: Goal: Knowledge of disease and its progression will improve Outcome: Progressing   Problem: Fluid Volume: Goal: Compliance with measures to maintain balanced fluid volume will improve Outcome: Progressing   Problem: Health Behavior/Discharge Planning: Goal: Ability to manage health-related needs will improve Outcome: Progressing   Problem: Nutritional: Goal: Ability to make healthy dietary choices will improve Outcome: Progressing   Problem: Clinical Measurements: Goal: Complications related to the disease process, condition or treatment will be avoided or minimized Outcome: Progressing   Problem: Safety: Goal: Non-violent  Restraint(s) Outcome: Progressing

## 2023-03-24 DIAGNOSIS — I059 Rheumatic mitral valve disease, unspecified: Secondary | ICD-10-CM | POA: Diagnosis not present

## 2023-03-24 LAB — GLUCOSE, CAPILLARY
Glucose-Capillary: 55 mg/dL — ABNORMAL LOW (ref 70–99)
Glucose-Capillary: 125 mg/dL — ABNORMAL HIGH (ref 70–99)
Glucose-Capillary: 120 mg/dL — ABNORMAL HIGH (ref 70–99)
Glucose-Capillary: 145 mg/dL — ABNORMAL HIGH (ref 70–99)
Glucose-Capillary: 111 mg/dL — ABNORMAL HIGH (ref 70–99)

## 2023-03-24 LAB — BASIC METABOLIC PANEL
Anion gap: 14 (ref 5–15)
BUN: 38 mg/dL — ABNORMAL HIGH (ref 8–23)
CO2: 25 mmol/L (ref 22–32)
Calcium: 8.2 mg/dL — ABNORMAL LOW (ref 8.9–10.3)
Chloride: 100 mmol/L (ref 98–111)
Creatinine, Ser: 6.3 mg/dL — ABNORMAL HIGH (ref 0.61–1.24)
GFR, Estimated: 9 mL/min — ABNORMAL LOW (ref >60)
Glucose, Bld: 112 mg/dL — ABNORMAL HIGH (ref 70–99)
Potassium: 3.4 mmol/L — ABNORMAL LOW (ref 3.5–5.1)
Sodium: 139 mmol/L (ref 135–145)

## 2023-03-24 NOTE — Progress Notes (Signed)
Patient found sitting on the floor next to his bed. No obvious injuries noted. Patient appeared at his baseline, requested to speak with his mom on the phone. This RN and Facilities manager assisted patient back into bed. Vital signs taken. Bed alarm on. Call bell within reach.

## 2023-03-24 NOTE — Progress Notes (Signed)
Physical Therapy Treatment Patient Details Name: [name] [name] MRN: [id number] DOB: [birth year] Today's Date: 03/24/2023   History of Present Illness [name] [name] is a 62 y.o. male who presents to the ED with worsening mental status. He was admitted with acute metabolic encephalopathy secondary to UTI and COVID-pneumonia and also missed hemodialysis sections. He was found to have MRSA and enterococcal bacteremia. He was started on Vancomycin. Patient was transferred to [facility] for fistula removal. 10/2 s/p placement of temporary dialysis catheter (20cm RIJ) using ultrasound and fluoroscopic guidance, harvest of left basilic vein, direct repair of left brachial artery with vein patch angioplasty, excision of left arm arteriovenous graft.  10/11: Pt slid OOB, no injuries. PMH: ESRD on HD MWF, hypertension, type 2 diabetes, pulmonary hypertension, cardiomyopathy, GERD, dyslipidemia, and anemia of CKD.    PT Comments  Pt received in supine, alert and oriented to self, pt agreeable to some participation in seated balance activity and very limited participation in transfer training. Had planned on working on OOB to chair transfers as pt will need to tolerate sitting up in chair for OP dialysis upon DC, but pt adamantly refusing due to expressed fear of falls and poor insight into need to mobilize. Attempted to call pt mother per his request during session but phone went unanswered. Pt needing mod to maxA for bed mobility and transfer attempts. RN notified he would benefit from drop arm chair in room to work on seated lateral scooting vs squat pivot transfers next session as he is currently refusing to attempt stand pivot OOB. Pt continues to benefit from PT services to progress toward functional mobility goals.    If plan is discharge home, recommend the following: A lot of help with bathing/dressing/bathroom;Assistance with cooking/housework;Help with stairs or ramp for entrance;Assist for  transportation;Two people to help with walking and/or transfers;Direct supervision/assist for medications management;Direct supervision/assist for financial management;Supervision due to cognitive status   Can travel by private vehicle     No  Equipment Recommendations  BSC/3in1;Hoyer lift    Recommendations for Other Services       Precautions / Restrictions Precautions Precautions: Fall Precaution Comments: history of falls, Contact precs Restrictions Weight Bearing Restrictions: No     Mobility  Bed Mobility Overal bed mobility: Needs Assistance Bed Mobility: Rolling, Sidelying to Sit, Sit to Supine Rolling: Min assist Sidelying to sit: Mod assist, HOB elevated, Used rails   Sit to supine: Mod assist   General bed mobility comments: Dense cues and encouragement to initiate, pt using bed rails with cues, anticipate pt could perform with slightly less assist but due to cognitive deficit/anxiety he needs significant trunk lifting assist and steadying.    Transfers Overall transfer level: Needs assistance Equipment used: Rolling walker (2 wheels) Transfers: Sit to/from Stand, Bed to chair/wheelchair/BSC Sit to Stand: Max assist           General transfer comment: STS from EOB<>RW with maxA, dense cues for safe UE placement, pt impulsive to sit after a few seconds due to cognitive deficit and poor insight into deficits and pt adamantly refusing transfer OOB to chair despite encouragement. Pt states "I'm going to fall" and moans repeatedly despite max reassurance (note pt slid OOB on his own in AM per chart review but no documented injuries from this)    Ambulation/Gait                   Stairs             Wheelchair  Mobility     Tilt Bed    Modified Rankin (Stroke Patients Only)       Balance Overall balance assessment: Needs assistance Sitting-balance support: Feet supported, Bilateral upper extremity supported Sitting balance-Leahy Scale:  Fair Sitting balance - Comments: fair to poor, due to behaviors/impulsivity to return to supine, pt tolerates sitting EOB ~5 mins total Postural control: Posterior lean, Right lateral lean (toward HOB) Standing balance support: Reliant on assistive device for balance, Bilateral upper extremity supported Standing balance-Leahy Scale: Poor Standing balance comment: maxA but appears somewhat due to poor effort                            Cognition Arousal: Alert Behavior During Therapy: Anxious, Restless, Lability Overall Cognitive Status: History of cognitive impairments - at baseline Area of Impairment: Safety/judgement, Following commands, Problem solving, Awareness                     Memory: Decreased recall of precautions, Decreased short-term memory Following Commands: Follows one step commands with increased time Safety/Judgement: Decreased awareness of safety, Decreased awareness of deficits Awareness: Intellectual Problem Solving: Difficulty sequencing, Requires verbal cues, Requires tactile cues, Decreased initiation General Comments: Pt very self-limiting and anxious regarding OOB mobility, repeatedly expresses fear of falls. Poor carryover of instructions. Perseverating on calling his mother and difficult to redirect. Pt mother did not answer when PTA attempted to help him call her x3 attempts (voicemail). Max cues and encouragement for pt to self-feed from lunch tray as he had not touched his food yet.        Exercises Other Exercises Other Exercises:  (pt refusing today, perseverating on calling his mother)    General Comments General comments (skin integrity, edema, etc.): No acute s/sx distress other than c/o fear of falls and anxiety to do anything other than lay in bed.      Pertinent Vitals/Pain Pain Assessment Pain Assessment: PAINAD Breathing: normal Negative Vocalization: occasional moan/groan, low speech, negative/disapproving quality Facial  Expression: sad, frightened, frown Body Language: tense, distressed pacing, fidgeting Consolability: distracted or reassured by voice/touch PAINAD Score: 4 Pain Location: not localized Pain Descriptors / Indicators: Guarding, Grimacing, Moaning Pain Intervention(s): Monitored during session, Repositioned, Limited activity within patient's tolerance    Home Living                          Prior Function            PT Goals (current goals can now be found in the care plan section) Acute Rehab PT Goals Patient Stated Goal: to go home PT Goal Formulation: With patient/family Time For Goal Achievement: 04/05/23 Progress towards PT goals: Not progressing toward goals - comment (self-limiting behaviors)    Frequency    Min 1X/week      PT Plan      Co-evaluation              AM-PAC PT "6 Clicks" Mobility   Outcome Measure  Help needed turning from your back to your side while in a flat bed without using bedrails?: A Little Help needed moving from lying on your back to sitting on the side of a flat bed without using bedrails?: A Lot Help needed moving to and from a bed to a chair (including a wheelchair)?: Total Help needed standing up from a chair using your arms (e.g., wheelchair or bedside chair)?: A Lot Help  needed to walk in hospital room?: Total Help needed climbing 3-5 steps with a railing? : Total 6 Click Score: 10    End of Session Equipment Utilized During Treatment: Gait belt Activity Tolerance: Treatment limited secondary to medical complications (Comment);Other (comment);Patient limited by fatigue (self-limiting due to behaviors) Patient left: in bed;with call bell/phone within reach;with bed alarm set;Other (comment) (bed in chair posture) Nurse Communication: Mobility status;Need for lift equipment;Other (comment) (reluctant to participate) PT Visit Diagnosis: Unsteadiness on feet (R26.81);Other abnormalities of gait and mobility  (R26.89);Muscle weakness (generalized) (M62.81)     Time: 7846-9629 PT Time Calculation (min) (ACUTE ONLY): 30 min  Charges:    $Therapeutic Activity: 23-37 mins PT General Charges $$ ACUTE PT VISIT: 1 Visit                     Levy Wellman P., PTA Acute Rehabilitation Services Secure Chat Preferred 9a-5:30pm Office: [phone]    [name] [facility] 03/24/2023, 3:47 PM

## 2023-03-24 NOTE — Progress Notes (Addendum)
PROGRESS NOTE    [name] [name]  UJW:[id number] DOB: [birth year] DOA: 03/06/2023 PCP: Farris Has, MD   Brief Narrative: 62 year old with past medical history significant for ESRD on hemodialysis MWF, hypertension, diabetes type 2, pulmonary hypertension, cardiomyopathy, GERD, dyslipidemia, anemia of CKD presents to the ED with worsening mental status.  He was admitted with acute metabolic encephalopathy secondary to UTI and COVID-pneumonia and also missed hemodialysis sections.  He was found to have MRSA and enterococcal bacteremia. He was started on Vancomycin.  ID has been following.  Patient was transferred to [facility] for graft removal on 10/2      Assessment & Plan:   Principal Problem:   Endocarditis of mitral valve Active Problems:   Essential hypertension   Neuropathy   Anemia of chronic disease   Hypokalemia   Hypothyroidism   GERD (gastroesophageal reflux disease)   ESRD (end stage renal disease) (HCC)   Acute metabolic encephalopathy   Protein-calorie malnutrition, severe   MRSA bacteremia   Enterococcal bacteremia   Hemodialysis catheter infection (HCC)   Delirium    MRSA and Enterococcus bacteremia, Mitral Valve Endocarditis, vegetation on [facility] Currently afebrile with no leukocytosis Concern for infection in the AV graft s/p excision of graft 10/2 TEE 10/07: Small linear 2.3 mm mobile vegetation attached to the lateral commissure of the P1 segment of the mitral valve consistent with vegetation.  Dialysis catheter tip in the right atrium with 6.4 mm x 2.8 mm mobile mass consistent with vegetation ID on board, need holiday line.  Hemodialysis catheter removed 10/8, plan to replace on 10/12 as per vascular Continue IV vancomycin  COVID infection Tested positive on 9/23 Completed course of Remdesivir, steroid Off isolation since 10/03  ESRD on hemodialysis MWF Managed per nephrology Underwent excision of AV fistula due to infection Temporary HD catheter with  vegetation.  Removed 10/08, on holiday line, plan to replace [facility] on 10/12 by vascular  Acute metabolic encephalopathy Improved In the setting of UTI/COVID, missed hemodialysis and bacteremia Likely has some cognitive impairment at baseline  Anemia of chronic kidney disease Stable  Hypertension Continue with metoprolol and amlodipine, added hydralazine (refusing meds)  Diabetes type 2 Continue with sliding scale insulin  Hypothyroidism Continue with Synthroid  GERD Continue with PPI  Thrombocytopenia Improving  Hypoalbuminemia On supplements       Nutrition Problem: Severe Malnutrition Etiology: chronic illness    Signs/Symptoms: severe muscle depletion, severe fat depletion    Interventions: Nepro shake  Estimated body mass index is 19.26 kg/m as calculated from the following:   Height as of this encounter: 5\' 10"  (1.778 m).   Weight as of this encounter: 60.9 kg.   DVT prophylaxis: SCDs Code Status: Full code Family Communication: None at bedside Disposition Plan:  Status is: Inpatient Remains inpatient appropriate because: Management of bacteremia.     Consultants:  Nephrology ID Vascular  Procedures:  Graft, TDC removal  Antimicrobials:  Vancomycin  Subjective: Denies any new complaints, misses his mom. Refusing meds and sometimes lab draws    Objective: Vitals:   03/24/23 0441 03/24/23 0914 03/24/23 0956 03/24/23 1525  BP: (!) 174/84 (!) 188/103 (!) 172/107 (!) 145/72  Pulse: 70 79 81   Resp: 16 18 16    Temp: 97.6 F (36.4 C) 98 F (36.7 C) 97.8 F (36.6 C) 98.2 F (36.8 C)  TempSrc:   Oral Oral  SpO2: 100% 100% 99% 100%  Weight:      Height:        Intake/Output  Summary (Last 24 hours) at 03/24/2023 1633 Last data filed at 03/23/2023 2055 Gross per 24 hour  Intake --  Output 0 ml  Net 0 ml   Filed Weights   03/10/23 1330 03/17/23 1253 03/17/23 1621  Weight: 62 kg 62.9 kg 60.9 kg    Examination: General:  NAD, chronically ill appearing  Cardiovascular: S1, S2 present Respiratory: CTAB Abdomen: Soft, nontender, nondistended, bowel sounds present Musculoskeletal: No bilateral pedal edema noted Skin: Normal Psychiatry: fair mood     Data Reviewed: I have personally reviewed following labs and imaging studies  CBC: Recent Labs  Lab 03/19/23 1450 03/20/23 0534 03/22/23 1058 03/23/23 0917  WBC 7.5 5.6 6.3 7.1  NEUTROABS  --   --  4.7 4.9  HGB 10.1* 8.8* 10.0* 10.5*  HCT 30.7* 26.8* 31.7* 32.7*  MCV 88.5 89.6 88.5 91.1  PLT 151 126* 137* 167   Basic Metabolic Panel: Recent Labs  Lab 03/19/23 1450 03/20/23 0534 03/22/23 1058 03/23/23 0917 03/24/23 0447  NA 130* 134* 138 139 139  K 3.3* 3.1* 4.1 3.9 3.4*  CL 95* 98 99 101 100  CO2 24 26 24 23 25   GLUCOSE 171* 114* 118* 128* 112*  BUN 21 21 25* 32* 38*  CREATININE 4.17* 4.52* 4.85* 5.67* 6.30*  CALCIUM 7.5* 7.4* 8.0* 8.6* 8.2*   GFR: Estimated Creatinine Clearance: 10.2 mL/min (A) (by C-G formula based on SCr of 6.3 mg/dL (H)). Liver Function Tests: No results for input(s): "AST", "ALT", "ALKPHOS", "BILITOT", "PROT", "ALBUMIN" in the last 168 hours.  No results for input(s): "LIPASE", "AMYLASE" in the last 168 hours. No results for input(s): "AMMONIA" in the last 168 hours. Coagulation Profile: No results for input(s): "INR", "PROTIME" in the last 168 hours. Cardiac Enzymes: No results for input(s): "CKTOTAL", "CKMB", "CKMBINDEX", "TROPONINI" in the last 168 hours. BNP (last 3 results) No results for input(s): "PROBNP" in the last 8760 hours. HbA1C: No results for input(s): "HGBA1C" in the last 72 hours. CBG: Recent Labs  Lab 03/23/23 1242 03/23/23 1637 03/23/23 2058 03/24/23 0722 03/24/23 1154  GLUCAP 144* 133* 112* 125* 120*   Lipid Profile: No results for input(s): "CHOL", "HDL", "LDLCALC", "TRIG", "CHOLHDL", "LDLDIRECT" in the last 72 hours. Thyroid Function Tests: No results for input(s): "TSH",  "T4TOTAL", "FREET4", "T3FREE", "THYROIDAB" in the last 72 hours. Anemia Panel: No results for input(s): "VITAMINB12", "FOLATE", "FERRITIN", "TIBC", "IRON", "RETICCTPCT" in the last 72 hours. Sepsis Labs: No results for input(s): "PROCALCITON", "LATICACIDVEN" in the last 168 hours.  Recent Results (from the past 240 hour(s))  Aerobic/Anaerobic Culture w Gram Stain (surgical/deep wound)     Status: None   Collection Time: 03/15/23  4:02 PM   Specimen: Hemodialysis Graft; Blood  Result Value Ref Range Status   Specimen Description HEMODIALYSIS GRAFT LEFT ARM  Final   Special Requests NONE  Final   Gram Stain   Final    RARE WBC PRESENT, PREDOMINANTLY PMN NO ORGANISMS SEEN    Culture   Final    No growth aerobically or anaerobically. Performed at [facility], [address], [place], [place] [address]    Report Status 03/20/2023 FINAL  Final  Culture, blood (Routine X 2) w Reflex to ID Panel     Status: None (Preliminary result)   Collection Time: 03/21/23  2:03 PM   Specimen: BLOOD RIGHT HAND  Result Value Ref Range Status   Specimen Description BLOOD RIGHT HAND  Final   Special Requests   Final  BOTTLES DRAWN AEROBIC AND ANAEROBIC Blood Culture adequate volume   Culture   Final    NO GROWTH 3 DAYS Performed at [facility], [address] [place], [place] [address]    Report Status PENDING  Incomplete  Culture, blood (Routine X 2) w Reflex to ID Panel     Status: None (Preliminary result)   Collection Time: 03/21/23  2:06 PM   Specimen: BLOOD RIGHT ARM  Result Value Ref Range Status   Specimen Description BLOOD RIGHT ARM  Final   Special Requests   Final    BOTTLES DRAWN AEROBIC AND ANAEROBIC Blood Culture adequate volume   Culture   Final    NO GROWTH 3 DAYS Performed at [facility], [address] [place], [place] [address]    Report Status PENDING  Incomplete         Radiology Studies: No results found.      Scheduled Meds:   amLODipine  10 mg Oral q morning   atorvastatin  20 mg Oral q morning   Chlorhexidine Gluconate Cloth  6 each Topical Q0600   Chlorhexidine Gluconate Cloth  6 each Topical Q0600   darbepoetin (ARANESP) injection - DIALYSIS  60 mcg Subcutaneous Q Thu-1800   feeding supplement (NEPRO CARB STEADY)  237 mL Oral TID BM   hydrALAZINE  10 mg Oral Q8H   influenza vac split trivalent PF  0.5 mL Intramuscular Tomorrow-1000   insulin aspart  0-9 Units Subcutaneous TID WC   levothyroxine  25 mcg Oral QAC breakfast   liver oil-zinc oxide   Topical Daily   metoprolol tartrate  50 mg Oral BID   multivitamin with minerals  1 tablet Oral Daily   mupirocin ointment   Nasal BID   pantoprazole  40 mg Oral Daily   polyethylene glycol  17 g Oral Daily   sodium chloride flush  10-40 mL Intracatheter Q12H   sorbitol  30 mL Oral Once   Continuous Infusions:  vancomycin Stopped (03/20/23 2344)     LOS: 18 days      Briant Cedar, MD [facility]   If 7PM-7AM, please contact night-coverage [email or web address]  03/24/2023, 4:33 PM

## 2023-03-24 NOTE — Plan of Care (Signed)
  Problem: Education: Goal: Knowledge of General Education information will improve Description: Including pain rating scale, medication(s)/side effects and non-pharmacologic comfort measures Outcome: Progressing   Problem: Health Behavior/Discharge Planning: Goal: Ability to manage health-related needs will improve Outcome: Progressing   Problem: Clinical Measurements: Goal: Ability to maintain clinical measurements within normal limits will improve Outcome: Progressing Goal: Will remain free from infection Outcome: Progressing Goal: Diagnostic test results will improve Outcome: Progressing Goal: Respiratory complications will improve Outcome: Progressing Goal: Cardiovascular complication will be avoided Outcome: Progressing   Problem: Activity: Goal: Risk for activity intolerance will decrease Outcome: Progressing   Problem: Nutrition: Goal: Adequate nutrition will be maintained Outcome: Progressing   Problem: Coping: Goal: Level of anxiety will decrease Outcome: Progressing   Problem: Elimination: Goal: Will not experience complications related to bowel motility Outcome: Progressing Goal: Will not experience complications related to urinary retention Outcome: Progressing   Problem: Pain Managment: Goal: General experience of comfort will improve Outcome: Progressing   Problem: Safety: Goal: Ability to remain free from injury will improve Outcome: Progressing   Problem: Skin Integrity: Goal: Risk for impaired skin integrity will decrease Outcome: Progressing   Problem: Education: Goal: Knowledge of risk factors and measures for prevention of condition will improve Outcome: Progressing   Problem: Coping: Goal: Psychosocial and spiritual needs will be supported Outcome: Progressing   Problem: Respiratory: Goal: Will maintain a patent airway Outcome: Progressing Goal: Complications related to the disease process, condition or treatment will be avoided or  minimized Outcome: Progressing   Problem: Education: Goal: Ability to describe self-care measures that may prevent or decrease complications (Diabetes Survival Skills Education) will improve Outcome: Progressing Goal: Individualized Educational Video(s) Outcome: Progressing   Problem: Coping: Goal: Ability to adjust to condition or change in health will improve Outcome: Progressing   Problem: Fluid Volume: Goal: Ability to maintain a balanced intake and output will improve Outcome: Progressing   Problem: Health Behavior/Discharge Planning: Goal: Ability to identify and utilize available resources and services will improve Outcome: Progressing Goal: Ability to manage health-related needs will improve Outcome: Progressing   Problem: Metabolic: Goal: Ability to maintain appropriate glucose levels will improve Outcome: Progressing   Problem: Nutritional: Goal: Maintenance of adequate nutrition will improve Outcome: Progressing Goal: Progress toward achieving an optimal weight will improve Outcome: Progressing   Problem: Skin Integrity: Goal: Risk for impaired skin integrity will decrease Outcome: Progressing   Problem: Tissue Perfusion: Goal: Adequacy of tissue perfusion will improve Outcome: Progressing   Problem: Education: Goal: Knowledge of disease and its progression will improve Outcome: Progressing   Problem: Fluid Volume: Goal: Compliance with measures to maintain balanced fluid volume will improve Outcome: Progressing   Problem: Health Behavior/Discharge Planning: Goal: Ability to manage health-related needs will improve Outcome: Progressing   Problem: Nutritional: Goal: Ability to make healthy dietary choices will improve Outcome: Progressing   Problem: Clinical Measurements: Goal: Complications related to the disease process, condition or treatment will be avoided or minimized Outcome: Progressing   Problem: Safety: Goal: Non-violent  Restraint(s) Outcome: Progressing

## 2023-03-24 NOTE — Anesthesia Preprocedure Evaluation (Addendum)
Anesthesia Evaluation  Patient identified by MRN, date of birth, ID band Patient awake    Reviewed: Allergy & Precautions, NPO status , Patient's Chart, lab work & pertinent test results  Airway Mallampati: Unable to assess       Dental   Pulmonary neg pulmonary ROS    + decreased breath sounds      Cardiovascular hypertension, Pt. on medications and Pt. on home beta blockers +CHF   Rhythm:Regular Rate:Normal     Neuro/Psych  PSYCHIATRIC DISORDERS      negative neurological ROS     GI/Hepatic ,GERD  Medicated,,  Endo/Other  diabetesHypothyroidism    Renal/GU ESRF and DialysisRenal disease     Musculoskeletal  (+) Arthritis ,    Abdominal   Peds  Hematology   Anesthesia Other Findings - Pulm HTN  Reproductive/Obstetrics                             Anesthesia Physical Anesthesia Plan  ASA: 3  Anesthesia Plan: General   Post-op Pain Management: Tylenol PO (pre-op)*   Induction: Intravenous  PONV Risk Score and Plan: 3 and Ondansetron, Midazolam and Treatment may vary due to age or medical condition  Airway Management Planned: Oral ETT and LMA  Additional Equipment: None  Intra-op Plan:   Post-operative Plan: Extubation in OR  Informed Consent: I have reviewed the patients History and Physical, chart, labs and discussed the procedure including the risks, benefits and alternatives for the proposed anesthesia with the patient or authorized representative who has indicated his/her understanding and acceptance.     Dental advisory given  Plan Discussed with: CRNA  Anesthesia Plan Comments:        Anesthesia Quick Evaluation

## 2023-03-24 NOTE — Progress Notes (Signed)
INFECTIOUS DISEASE ATTENDING ADDENDUM:   Date: 03/24/2023  Patient name: [name] [name]  Medical record number: [id number]  Date of birth: [birth year]   Patient's repeat blood cultures remain negative and he is going to have a new Dialysis catheter placed tomorrow  I would plan on him receiving vancomycin with hemodialysis with day #1 being Tober the eighth  I will ensure that he has hospital follow-up and on my clinic    Kyris Marschke has an appointment on  04/18/2023 at 115PM with Dr. Daiva Eves at  [facility], which  is located in the [facility] at  [address] in [place].  [address], which is located to the left of the elevators.  Phone: [phone]  Fax: [phone]  [email or web address]  The patient should arrive 30 minutes prior to their appoitment.  My partner Dr. Luciana Axe will follow-up on the cultures over the weekend and I will be back on Monday should the patient still be in the hospital.  Paulette Blanch Dam 03/24/2023, 5:49 PM

## 2023-03-24 NOTE — Progress Notes (Signed)
Temporary HD line was removed due to a vegetation  being noted on TEE.  He has now been cleared by ID for a new TDC placement. Per nephrology there are no acute indications for HD.  We will plan for [facility] placement tomorrow morning. N.p.o. midnight, consent ordered  Daria Pastures MD [facility] Phone Number: [phone] 03/24/2023 2:21 PM

## 2023-03-24 NOTE — Progress Notes (Signed)
[facility] Progress Note   Subjective:    Seen and examined patient at bedside. Informed by bedside nurse of having a witnessed fall in his room. Patient denied hitting his head and appears he didn't sustain any acute injuries. Seen lying in bed. Denies SOB, CP, and N/V. On day 3 of line holiday. Spoke to ID and he's now cleared for new [facility] placement. Consulted VVS.  Objective Vitals:   03/23/23 2055 03/24/23 0441 03/24/23 0914 03/24/23 0956  BP: (!) 164/82 (!) 174/84 (!) 188/103 (!) 172/107  Pulse: 77 70 79 81  Resp: 18 16 18 16   Temp: 98.2 F (36.8 C) 97.6 F (36.4 C) 98 F (36.7 C) 97.8 F (36.6 C)  TempSrc:    Oral  SpO2: 100% 100% 100% 99%  Weight:      Height:       Physical Exam General:chronically ill appearing, thin male in NAD Heart:RRR, no mrg Lungs:CTAB, nml WOB on RA Abdomen:soft, NTND Extremities:no LE edema Dialysis Access: none  Filed Weights   03/10/23 1330 03/17/23 1253 03/17/23 1621  Weight: 62 kg 62.9 kg 60.9 kg    Intake/Output Summary (Last 24 hours) at 03/24/2023 1059 Last data filed at 03/23/2023 2055 Gross per 24 hour  Intake --  Output 0 ml  Net 0 ml    Additional Objective Labs: Basic Metabolic Panel: Recent Labs  Lab 03/22/23 1058 03/23/23 0917 03/24/23 0447  NA 138 139 139  K 4.1 3.9 3.4*  CL 99 101 100  CO2 24 23 25   GLUCOSE 118* 128* 112*  BUN 25* 32* 38*  CREATININE 4.85* 5.67* 6.30*  CALCIUM 8.0* 8.6* 8.2*   Liver Function Tests: No results for input(s): "AST", "ALT", "ALKPHOS", "BILITOT", "PROT", "ALBUMIN" in the last 168 hours. No results for input(s): "LIPASE", "AMYLASE" in the last 168 hours. CBC: Recent Labs  Lab 03/19/23 1450 03/20/23 0534 03/22/23 1058 03/23/23 0917  WBC 7.5 5.6 6.3 7.1  NEUTROABS  --   --  4.7 4.9  HGB 10.1* 8.8* 10.0* 10.5*  HCT 30.7* 26.8* 31.7* 32.7*  MCV 88.5 89.6 88.5 91.1  PLT 151 126* 137* 167   Blood Culture    Component Value Date/Time   SDES BLOOD RIGHT  ARM 03/21/2023 1406   SPECREQUEST  03/21/2023 1406    BOTTLES DRAWN AEROBIC AND ANAEROBIC Blood Culture adequate volume   CULT  03/21/2023 1406    NO GROWTH 2 DAYS Performed at [facility], [address] [place], [place] [address]    REPTSTATUS PENDING 03/21/2023 1406    Cardiac Enzymes: No results for input(s): "CKTOTAL", "CKMB", "CKMBINDEX", "TROPONINI" in the last 168 hours. CBG: Recent Labs  Lab 03/23/23 0736 03/23/23 1242 03/23/23 1637 03/23/23 2058 03/24/23 0722  GLUCAP 118* 144* 133* 112* 125*   Iron Studies: No results for input(s): "IRON", "TIBC", "TRANSFERRIN", "FERRITIN" in the last 72 hours. Lab Results  Component Value Date   INR 1.1 03/06/2023   INR 1.1 08/30/2020   Studies/Results: No results found.  Medications:  vancomycin Stopped (03/20/23 2344)    amLODipine  10 mg Oral q morning   atorvastatin  20 mg Oral q morning   Chlorhexidine Gluconate Cloth  6 each Topical Q0600   Chlorhexidine Gluconate Cloth  6 each Topical Q0600   darbepoetin (ARANESP) injection - DIALYSIS  60 mcg Subcutaneous Q Thu-1800   feeding supplement (NEPRO CARB STEADY)  237 mL Oral TID BM   hydrALAZINE  10 mg Oral Q8H   influenza  vac split trivalent PF  0.5 mL Intramuscular Tomorrow-1000   insulin aspart  0-9 Units Subcutaneous TID WC   levothyroxine  25 mcg Oral QAC breakfast   liver oil-zinc oxide   Topical Daily   metoprolol tartrate  50 mg Oral BID   multivitamin with minerals  1 tablet Oral Daily   mupirocin ointment   Nasal BID   pantoprazole  40 mg Oral Daily   polyethylene glycol  17 g Oral Daily   sodium chloride flush  10-40 mL Intracatheter Q12H   sorbitol  30 mL Oral Once    Dialysis Orders: [facility] MWF. 4 hours. EDW 64 kg. LUE AVG 15-gauge. Flow rates: 400/autoflow 1.5. 3K/2.5 calcium. No heparin. Mircera 75 mcg every 2 weeks (last dose 9/11). Venofer 50 mg q. weekly.   Assessment/Plan: 1.  MRSA and E faecalis bacteremia - ID following. On  vancomycin x6 weeks with end date 05/03/23.  Concern for seeding of AVG, VVS removed 10/2 with no obvious infection surround it. Temporary cath placed.  TEE 10/7 with vegetations noted on mitral valve and temporary dialysis catheter.  Patient will require line holiday.  Temp cath removed after HD on 10/8. I spoke with ID today who cleared patient for new [facility] placement since this is day 3. Consulted VVS.  Appreciate VVS & ID assistance.  ESRD- HD MWF. Last HD 10/7.  K 3.4 and BUN 38 today.  Next HD tentative for tomorrow depending on when [facility] can be placed. See above. No acute indications that HD needed today. COVID-19 PNA - Completed remdesivir.  Out of isolation.  Hypokalemia - chronic issue, liberalize diet. UStable. Monitor. Altered mental status - waxing and wanning, appears confused today.  Possible AVG infection- removed on 10/2 and now on line holiday. See above for detailed plan.  HTN/volume -BP elevated. Hx hypOtension but BP now running high.  Under edw, likely weight loss.  UF as tolerated. Plan for lower dry weight on d/c.  Continue metoprolol and amlodipine, hydralazine added 10/8. Anemia of CKD -Hgb ^10.5, ESA restarted on 10/3. Secondary hyperparathyroidism -placed on binders, corrected calcium and phosphorus at goal Nutrition - Carb modified diet w/Fluid restrictions.  DMT2 - per PMD Debility - chronic issue.  Would benefit from SNF placement but previously has refused. Falling at home per caretaker when discussed at outpatient HD. Cared for by 23 year old mother who does her best but can not pick him up when he falls per her own account.  Agreed to SNF placement!  Salome Holmes, NP [facility] 03/24/2023,10:59 AM  LOS: 18 days

## 2023-03-25 ENCOUNTER — Inpatient Hospital Stay (HOSPITAL_COMMUNITY): Payer: Medicare HMO | Admitting: Anesthesiology

## 2023-03-25 ENCOUNTER — Inpatient Hospital Stay (HOSPITAL_COMMUNITY): Payer: Medicare HMO

## 2023-03-25 ENCOUNTER — Other Ambulatory Visit: Payer: Self-pay

## 2023-03-25 ENCOUNTER — Encounter (HOSPITAL_COMMUNITY)
Admission: EM | Disposition: A | Discharge status: Skilled Nursing Facility | Payer: Self-pay | Source: Home / Self Care | Attending: Internal Medicine

## 2023-03-25 ENCOUNTER — Encounter (HOSPITAL_COMMUNITY): Payer: Self-pay | Admitting: Internal Medicine

## 2023-03-25 DIAGNOSIS — I132 Hypertensive heart and chronic kidney disease with heart failure and with stage 5 chronic kidney disease, or end stage renal disease: Secondary | ICD-10-CM

## 2023-03-25 DIAGNOSIS — I059 Rheumatic mitral valve disease, unspecified: Secondary | ICD-10-CM | POA: Diagnosis not present

## 2023-03-25 DIAGNOSIS — N186 End stage renal disease: Secondary | ICD-10-CM

## 2023-03-25 DIAGNOSIS — I509 Heart failure, unspecified: Secondary | ICD-10-CM

## 2023-03-25 HISTORY — PX: INSERTION OF DIALYSIS CATHETER: SHX1324

## 2023-03-25 LAB — BASIC METABOLIC PANEL
Anion gap: 16 — ABNORMAL HIGH (ref 5–15)
BUN: 44 mg/dL — ABNORMAL HIGH (ref 8–23)
CO2: 24 mmol/L (ref 22–32)
Calcium: 8.5 mg/dL — ABNORMAL LOW (ref 8.9–10.3)
Chloride: 103 mmol/L (ref 98–111)
Creatinine, Ser: 7.56 mg/dL — ABNORMAL HIGH (ref 0.61–1.24)
GFR, Estimated: 7 mL/min — ABNORMAL LOW (ref >60)
Glucose, Bld: 136 mg/dL — ABNORMAL HIGH (ref 70–99)
Potassium: 4 mmol/L (ref 3.5–5.1)
Sodium: 143 mmol/L (ref 135–145)

## 2023-03-25 LAB — GLUCOSE, CAPILLARY
Glucose-Capillary: 119 mg/dL — ABNORMAL HIGH (ref 70–99)
Glucose-Capillary: 107 mg/dL — ABNORMAL HIGH (ref 70–99)
Glucose-Capillary: 97 mg/dL (ref 70–99)
Glucose-Capillary: 96 mg/dL (ref 70–99)

## 2023-03-25 LAB — PHOSPHORUS: Phosphorus: 3.5 mg/dL (ref 2.5–4.6)

## 2023-03-25 LAB — CBC WITH DIFFERENTIAL/PLATELET
Abs Immature Granulocytes: 0.01 10*3/uL (ref 0.00–0.07)
Basophils Absolute: 0 10*3/uL (ref 0.0–0.1)
Basophils Relative: 1 %
Eosinophils Absolute: 0 10*3/uL (ref 0.0–0.5)
Eosinophils Relative: 1 %
HCT: 29.6 % — ABNORMAL LOW (ref 39.0–52.0)
Hemoglobin: 9.3 g/dL — ABNORMAL LOW (ref 13.0–17.0)
Immature Granulocytes: 0 %
Lymphocytes Relative: 28 %
Lymphs Abs: 1.1 10*3/uL (ref 0.7–4.0)
MCH: 28.5 pg (ref 26.0–34.0)
MCHC: 31.4 g/dL (ref 30.0–36.0)
MCV: 90.8 fL (ref 80.0–100.0)
Monocytes Absolute: 0.4 10*3/uL (ref 0.1–1.0)
Monocytes Relative: 9 %
Neutro Abs: 2.4 10*3/uL (ref 1.7–7.7)
Neutrophils Relative %: 61 %
Platelets: 141 10*3/uL — ABNORMAL LOW (ref 150–400)
RBC: 3.26 MIL/uL — ABNORMAL LOW (ref 4.22–5.81)
RDW: 15.5 % (ref 11.5–15.5)
WBC: 3.9 10*3/uL — ABNORMAL LOW (ref 4.0–10.5)
nRBC: 0 % (ref 0.0–0.2)

## 2023-03-25 SURGERY — INSERTION OF DIALYSIS CATHETER
Anesthesia: General | Site: Chest | Laterality: Right

## 2023-03-25 MED ORDER — ACETAMINOPHEN 500 MG PO TABS: 1000.0000 mg | ORAL_TABLET | Freq: Once | ORAL | Status: DC

## 2023-03-25 MED ORDER — HEPARIN 6000 UNIT IRRIGATION SOLUTION
Status: AC
  Filled 2023-03-25: qty 500

## 2023-03-25 MED ORDER — OXYCODONE HCL 5 MG/5ML PO SOLN: 5.0000 mg | Freq: Once | ORAL | Status: DC | PRN

## 2023-03-25 MED ORDER — HEPARIN SODIUM (PORCINE) 1000 UNIT/ML IJ SOLN
INTRAMUSCULAR | Status: DC | PRN
  Administered 2023-03-25: 3.2 [IU]

## 2023-03-25 MED ORDER — EPHEDRINE 5 MG/ML INJ
INTRAVENOUS | Status: AC
  Filled 2023-03-25: qty 5

## 2023-03-25 MED ORDER — ACETAMINOPHEN 160 MG/5ML PO SOLN: 325.0000 mg | ORAL | Status: DC | PRN

## 2023-03-25 MED ORDER — CHLORHEXIDINE GLUCONATE 0.12 % MT SOLN: 15.0000 mL | Freq: Once | OROMUCOSAL | Status: DC

## 2023-03-25 MED ORDER — FENTANYL CITRATE (PF) 250 MCG/5ML IJ SOLN
INTRAMUSCULAR | Status: AC
  Filled 2023-03-25: qty 5

## 2023-03-25 MED ORDER — FENTANYL CITRATE (PF) 250 MCG/5ML IJ SOLN
INTRAMUSCULAR | Status: DC | PRN
  Administered 2023-03-25 (×2): 25 ug via INTRAVENOUS

## 2023-03-25 MED ORDER — ACETAMINOPHEN 10 MG/ML IV SOLN: 1000.0000 mg | Freq: Once | INTRAVENOUS | Status: DC | PRN

## 2023-03-25 MED ORDER — CEFAZOLIN SODIUM-DEXTROSE 2-3 GM-%(50ML) IV SOLR
INTRAVENOUS | Status: DC | PRN
Start: 2023-03-25 — End: 2023-03-25
  Administered 2023-03-25: 2 g via INTRAVENOUS

## 2023-03-25 MED ORDER — SUCCINYLCHOLINE CHLORIDE 200 MG/10ML IV SOSY
PREFILLED_SYRINGE | INTRAVENOUS | Status: AC
  Filled 2023-03-25: qty 10

## 2023-03-25 MED ORDER — LIDOCAINE 2% (20 MG/ML) 5 ML SYRINGE
INTRAMUSCULAR | Status: DC | PRN
  Administered 2023-03-25: 100 mg via INTRAVENOUS

## 2023-03-25 MED ORDER — PROPOFOL 10 MG/ML IV BOLUS
INTRAVENOUS | Status: AC
  Filled 2023-03-25: qty 20

## 2023-03-25 MED ORDER — ALTEPLASE 2 MG IJ SOLR: 2.0000 mg | Freq: Once | INTRAMUSCULAR | Status: DC | PRN

## 2023-03-25 MED ORDER — LIDOCAINE 2% (20 MG/ML) 5 ML SYRINGE
INTRAMUSCULAR | Status: AC
  Filled 2023-03-25: qty 5

## 2023-03-25 MED ORDER — ROCURONIUM BROMIDE 10 MG/ML (PF) SYRINGE
PREFILLED_SYRINGE | INTRAVENOUS | Status: AC
  Filled 2023-03-25: qty 10

## 2023-03-25 MED ORDER — 0.9 % SODIUM CHLORIDE (POUR BTL) OPTIME
TOPICAL | Status: DC | PRN
  Administered 2023-03-25: 1000 mL

## 2023-03-25 MED ORDER — PHENYLEPHRINE HCL-NACL 20-0.9 MG/250ML-% IV SOLN
INTRAVENOUS | Status: DC | PRN
  Administered 2023-03-25: 15 ug/min via INTRAVENOUS

## 2023-03-25 MED ORDER — HEPARIN SODIUM (PORCINE) 5000 UNIT/ML IJ SOLN
5000.0000 [IU] | Freq: Three times a day (TID) | INTRAMUSCULAR | Status: DC
  Administered 2023-03-25 – 2023-03-29 (×10): 5000 [IU] via SUBCUTANEOUS
  Filled 2023-03-25 (×12): qty 1

## 2023-03-25 MED ORDER — INSULIN ASPART 100 UNIT/ML IJ SOLN: 0.0000 [IU] | INTRAMUSCULAR | Status: DC | PRN

## 2023-03-25 MED ORDER — ONDANSETRON HCL 4 MG/2ML IJ SOLN
INTRAMUSCULAR | Status: DC | PRN
  Administered 2023-03-25: 4 mg via INTRAVENOUS

## 2023-03-25 MED ORDER — CALCIUM CHLORIDE 10 % IV SOLN
INTRAVENOUS | Status: AC
  Filled 2023-03-25: qty 10

## 2023-03-25 MED ORDER — MIDAZOLAM HCL 2 MG/2ML IJ SOLN
INTRAMUSCULAR | Status: DC | PRN
  Administered 2023-03-25: 2 mg via INTRAVENOUS

## 2023-03-25 MED ORDER — HEPARIN SODIUM (PORCINE) 1000 UNIT/ML DIALYSIS
1000.0000 [IU] | INTRAMUSCULAR | Status: DC | PRN
  Administered 2023-03-25: 3200 [IU] via INTRAVENOUS_CENTRAL
  Filled 2023-03-25: qty 1

## 2023-03-25 MED ORDER — FENTANYL CITRATE (PF) 250 MCG/5ML IJ SOLN: INTRAMUSCULAR | Status: DC | PRN

## 2023-03-25 MED ORDER — ORAL CARE MOUTH RINSE: 15.0000 mL | Freq: Once | OROMUCOSAL | Status: DC

## 2023-03-25 MED ORDER — PHENYLEPHRINE 80 MCG/ML (10ML) SYRINGE FOR IV PUSH (FOR BLOOD PRESSURE SUPPORT)
PREFILLED_SYRINGE | INTRAVENOUS | Status: AC
  Filled 2023-03-25: qty 10

## 2023-03-25 MED ORDER — OXYCODONE HCL 5 MG PO TABS: 5.0000 mg | ORAL_TABLET | Freq: Once | ORAL | Status: DC | PRN

## 2023-03-25 MED ORDER — HEPARIN SODIUM (PORCINE) 1000 UNIT/ML IJ SOLN
INTRAMUSCULAR | Status: AC
  Filled 2023-03-25: qty 10

## 2023-03-25 MED ORDER — PROPOFOL 10 MG/ML IV BOLUS
INTRAVENOUS | Status: DC | PRN
  Administered 2023-03-25: 100 mg via INTRAVENOUS

## 2023-03-25 MED ORDER — FENTANYL CITRATE (PF) 100 MCG/2ML IJ SOLN: 25.0000 ug | INTRAMUSCULAR | Status: DC | PRN

## 2023-03-25 MED ORDER — ACETAMINOPHEN 325 MG PO TABS: 325.0000 mg | ORAL_TABLET | ORAL | Status: DC | PRN

## 2023-03-25 MED ORDER — KETAMINE HCL 50 MG/5ML IJ SOSY
PREFILLED_SYRINGE | INTRAMUSCULAR | Status: AC
  Filled 2023-03-25: qty 5

## 2023-03-25 MED ORDER — MIDAZOLAM HCL 2 MG/2ML IJ SOLN
INTRAMUSCULAR | Status: AC
  Filled 2023-03-25: qty 2

## 2023-03-25 MED ORDER — HEPARIN 6000 UNIT IRRIGATION SOLUTION
Status: DC | PRN
  Administered 2023-03-25: 1

## 2023-03-25 MED ORDER — SODIUM CHLORIDE 0.9 % IV SOLN: INTRAVENOUS | Status: DC

## 2023-03-25 SURGICAL SUPPLY — 44 items
ADH SKN CLS APL DERMABOND .7 (GAUZE/BANDAGES/DRESSINGS) ×1
BAG COUNTER SPONGE SURGICOUNT (BAG) ×1 IMPLANT
BAG DECANTER FOR FLEXI CONT (MISCELLANEOUS) ×1 IMPLANT
BAG SPNG CNTER NS LX DISP (BAG) ×1
BIOPATCH RED 1 DISK 7.0 (GAUZE/BANDAGES/DRESSINGS) ×1 IMPLANT
CATH PALINDROME-P 19CM W/VT (CATHETERS) IMPLANT
CATH PALINDROME-P 23CM W/VT (CATHETERS) IMPLANT
CATH PALINDROME-P 28CM W/VT (CATHETERS) IMPLANT
CLIP TI MEDIUM 6 (CLIP) ×1 IMPLANT
CLIP TI WIDE RED SMALL 6 (CLIP) ×1 IMPLANT
COVER PROBE W GEL 5X96 (DRAPES) ×1 IMPLANT
COVER SURGICAL LIGHT HANDLE (MISCELLANEOUS) ×1 IMPLANT
COVER TRANSDUCER ULTRASND GEL (DISPOSABLE) IMPLANT
DERMABOND ADVANCED .7 DNX12 (GAUZE/BANDAGES/DRESSINGS) ×1 IMPLANT
DRAPE C-ARM 42X72 X-RAY (DRAPES) ×1 IMPLANT
DRAPE CHEST BREAST 15X10 FENES (DRAPES) ×1 IMPLANT
GAUZE 4X4 16PLY ~~LOC~~+RFID DBL (SPONGE) ×1 IMPLANT
GLOVE BIOGEL PI IND STRL 7.0 (GLOVE) ×1 IMPLANT
GOWN STRL REUS W/ TWL LRG LVL3 (GOWN DISPOSABLE) ×1 IMPLANT
GOWN STRL REUS W/ TWL XL LVL3 (GOWN DISPOSABLE) ×1 IMPLANT
GOWN STRL REUS W/TWL LRG LVL3 (GOWN DISPOSABLE) ×1
GOWN STRL REUS W/TWL XL LVL3 (GOWN DISPOSABLE) ×1
KIT BASIN OR (CUSTOM PROCEDURE TRAY) ×1 IMPLANT
KIT PALINDROME-P 55CM (CATHETERS) IMPLANT
KIT TURNOVER KIT B (KITS) ×1 IMPLANT
NDL 18GX1X1/2 (RX/OR ONLY) (NEEDLE) ×1 IMPLANT
NDL HYPO 25GX1X1/2 BEV (NEEDLE) ×1 IMPLANT
NEEDLE 18GX1X1/2 (RX/OR ONLY) (NEEDLE) ×1 IMPLANT
NEEDLE HYPO 25GX1X1/2 BEV (NEEDLE) ×1 IMPLANT
NS IRRIG 1000ML POUR BTL (IV SOLUTION) ×1 IMPLANT
PACK BASIC III (CUSTOM PROCEDURE TRAY) ×1
PACK SRG BSC III STRL LF ECLPS (CUSTOM PROCEDURE TRAY) ×1 IMPLANT
PAD ARMBOARD 7.5X6 YLW CONV (MISCELLANEOUS) ×2 IMPLANT
POWDER SURGICEL 3.0 GRAM (HEMOSTASIS) IMPLANT
SOAP 2 % CHG 4 OZ (WOUND CARE) ×1 IMPLANT
SUT ETHILON 3 0 PS 1 (SUTURE) ×1 IMPLANT
SUT MNCRL AB 4-0 PS2 18 (SUTURE) ×1 IMPLANT
SYR 10ML LL (SYRINGE) ×1 IMPLANT
SYR 20ML LL LF (SYRINGE) ×2 IMPLANT
SYR 5ML LL (SYRINGE) ×1 IMPLANT
SYR CONTROL 10ML LL (SYRINGE) ×1 IMPLANT
TOWEL GREEN STERILE (TOWEL DISPOSABLE) ×1 IMPLANT
TOWEL GREEN STERILE FF (TOWEL DISPOSABLE) ×2 IMPLANT
WATER STERILE IRR 1000ML POUR (IV SOLUTION) ×1 IMPLANT

## 2023-03-25 NOTE — Progress Notes (Addendum)
Pt refusing to let us to wipe him down, take blood sugar and take the order tylenol. Pt educated and both Careers adviser and anesthesiologist notified. No new orders received.   Dr. Hart Rochester said they will do the CBG once he is in the OR

## 2023-03-25 NOTE — Transfer of Care (Signed)
Immediate Anesthesia Transfer of Care Note  Patient: [name] [name]  Procedure(s) Performed: INSERTION OF RIGHT TUNNELED DIALYSIS CATHETER (Right: Chest)  Patient Location: PACU  Anesthesia Type:General  Level of Consciousness: sedated  Airway & Oxygen Therapy: Patient Spontanous Breathing  Post-op Assessment: Report given to RN and Post -op Vital signs reviewed and stable  Post vital signs: Reviewed and stable  Last Vitals:  Vitals Value Taken Time  BP 147/73 03/25/23 0945  Temp 36.1 C 03/25/23 0945  Pulse 56 03/25/23 0947  Resp 9 03/25/23 0947  SpO2 100 % 03/25/23 0947  Vitals shown include unfiled device data.  Last Pain:  Vitals:   03/25/23 0313  TempSrc:   PainSc: 0-No pain      Patients Stated Pain Goal: 0 (03/18/23 0950)  Complications: No notable events documented.

## 2023-03-25 NOTE — Plan of Care (Signed)
  Problem: Education: Goal: Knowledge of General Education information will improve Description: Including pain rating scale, medication(s)/side effects and non-pharmacologic comfort measures Outcome: Progressing   Problem: Health Behavior/Discharge Planning: Goal: Ability to manage health-related needs will improve Outcome: Progressing   Problem: Clinical Measurements: Goal: Ability to maintain clinical measurements within normal limits will improve Outcome: Progressing Goal: Will remain free from infection Outcome: Progressing Goal: Diagnostic test results will improve Outcome: Progressing Goal: Respiratory complications will improve Outcome: Progressing Goal: Cardiovascular complication will be avoided Outcome: Progressing   Problem: Activity: Goal: Risk for activity intolerance will decrease Outcome: Progressing   Problem: Nutrition: Goal: Adequate nutrition will be maintained Outcome: Progressing   Problem: Coping: Goal: Level of anxiety will decrease Outcome: Progressing   Problem: Elimination: Goal: Will not experience complications related to bowel motility Outcome: Progressing Goal: Will not experience complications related to urinary retention Outcome: Progressing   Problem: Pain Managment: Goal: General experience of comfort will improve Outcome: Progressing   Problem: Safety: Goal: Ability to remain free from injury will improve Outcome: Progressing   Problem: Skin Integrity: Goal: Risk for impaired skin integrity will decrease Outcome: Progressing   Problem: Education: Goal: Knowledge of risk factors and measures for prevention of condition will improve Outcome: Progressing   Problem: Coping: Goal: Psychosocial and spiritual needs will be supported Outcome: Progressing   Problem: Respiratory: Goal: Will maintain a patent airway Outcome: Progressing Goal: Complications related to the disease process, condition or treatment will be avoided or  minimized Outcome: Progressing   Problem: Education: Goal: Ability to describe self-care measures that may prevent or decrease complications (Diabetes Survival Skills Education) will improve Outcome: Progressing Goal: Individualized Educational Video(s) Outcome: Progressing   Problem: Coping: Goal: Ability to adjust to condition or change in health will improve Outcome: Progressing   Problem: Fluid Volume: Goal: Ability to maintain a balanced intake and output will improve Outcome: Progressing   Problem: Health Behavior/Discharge Planning: Goal: Ability to identify and utilize available resources and services will improve Outcome: Progressing Goal: Ability to manage health-related needs will improve Outcome: Progressing   Problem: Metabolic: Goal: Ability to maintain appropriate glucose levels will improve Outcome: Progressing   Problem: Nutritional: Goal: Maintenance of adequate nutrition will improve Outcome: Progressing Goal: Progress toward achieving an optimal weight will improve Outcome: Progressing   Problem: Skin Integrity: Goal: Risk for impaired skin integrity will decrease Outcome: Progressing   Problem: Tissue Perfusion: Goal: Adequacy of tissue perfusion will improve Outcome: Progressing   Problem: Education: Goal: Knowledge of disease and its progression will improve Outcome: Progressing   Problem: Fluid Volume: Goal: Compliance with measures to maintain balanced fluid volume will improve Outcome: Progressing   Problem: Health Behavior/Discharge Planning: Goal: Ability to manage health-related needs will improve Outcome: Progressing   Problem: Nutritional: Goal: Ability to make healthy dietary choices will improve Outcome: Progressing   Problem: Clinical Measurements: Goal: Complications related to the disease process, condition or treatment will be avoided or minimized Outcome: Progressing   Problem: Safety: Goal: Non-violent  Restraint(s) Outcome: Progressing

## 2023-03-25 NOTE — Anesthesia Postprocedure Evaluation (Signed)
Anesthesia Post Note  Patient: [name] [name]  Procedure(s) Performed: INSERTION OF RIGHT TUNNELED DIALYSIS CATHETER (Right: Chest)     Patient location during evaluation: PACU Anesthesia Type: General Level of consciousness: awake and alert Pain management: pain level controlled Vital Signs Assessment: post-procedure vital signs reviewed and stable Respiratory status: spontaneous breathing, nonlabored ventilation, respiratory function stable and patient connected to nasal cannula oxygen Cardiovascular status: blood pressure returned to baseline and stable Postop Assessment: no apparent nausea or vomiting Anesthetic complications: no  No notable events documented.  Last Vitals:  Vitals:   03/25/23 1015 03/25/23 1040  BP: (!) 150/71 (!) 172/78  Pulse: (!) 58 (!) 57  Resp: 12 18  Temp: (!) 36.4 C 36.7 C  SpO2: 99%     Last Pain:  Vitals:   03/25/23 1015  TempSrc:   PainSc: 0-No pain                 Shelton Silvas

## 2023-03-25 NOTE — Progress Notes (Signed)
Patient seen and examined in holding. No changes to medication history or physical exam since last seen in clinic. Yesterday we discussed the risks and benefits of TDC placement, and  [name] [name] elected to proceed.   Daria Pastures MD

## 2023-03-25 NOTE — Progress Notes (Signed)
[facility] Progress Note   Subjective:    Seen and examined patient at bedside. S/p new TDC placement this morning by Dr. Hetty Blend. Seen in bed resting. He appears comfortable. Plan for HD this afternoon.  Objective Vitals:   03/25/23 0945 03/25/23 1000 03/25/23 1015 03/25/23 1040  BP: (!) 147/73 (!) 153/72 (!) 150/71 (!) 172/78  Pulse: (!) 57 (!) 57 (!) 58 (!) 57  Resp: 10 13 12 18   Temp: (!) 97 F (36.1 C)  (!) 97.5 F (36.4 C) 98 F (36.7 C)  TempSrc:      SpO2: 100% 98% 99%   Weight:      Height:       Physical Exam General:chronically ill appearing, thin male in NAD, on RA Heart:RRR, no mrg Lungs:CTAB, nml WOB on RA Abdomen:soft, NTND Extremities:no LE edema Dialysis Access: none  Filed Weights   03/17/23 1253 03/17/23 1621 03/25/23 0728  Weight: 62.9 kg 60.9 kg 60.9 kg    Intake/Output Summary (Last 24 hours) at 03/25/2023 1153 Last data filed at 03/25/2023 0947 Gross per 24 hour  Intake 200 ml  Output 5 ml  Net 195 ml    Additional Objective Labs: Basic Metabolic Panel: Recent Labs  Lab 03/22/23 1058 03/23/23 0917 03/24/23 0447  NA 138 139 139  K 4.1 3.9 3.4*  CL 99 101 100  CO2 24 23 25   GLUCOSE 118* 128* 112*  BUN 25* 32* 38*  CREATININE 4.85* 5.67* 6.30*  CALCIUM 8.0* 8.6* 8.2*   Liver Function Tests: No results for input(s): "AST", "ALT", "ALKPHOS", "BILITOT", "PROT", "ALBUMIN" in the last 168 hours. No results for input(s): "LIPASE", "AMYLASE" in the last 168 hours. CBC: Recent Labs  Lab 03/19/23 1450 03/20/23 0534 03/22/23 1058 03/23/23 0917  WBC 7.5 5.6 6.3 7.1  NEUTROABS  --   --  4.7 4.9  HGB 10.1* 8.8* 10.0* 10.5*  HCT 30.7* 26.8* 31.7* 32.7*  MCV 88.5 89.6 88.5 91.1  PLT 151 126* 137* 167   Blood Culture    Component Value Date/Time   SDES BLOOD RIGHT ARM 03/21/2023 1406   SPECREQUEST  03/21/2023 1406    BOTTLES DRAWN AEROBIC AND ANAEROBIC Blood Culture adequate volume   CULT  03/21/2023 1406    NO  GROWTH 4 DAYS Performed at [facility], [address], [place], [place] [address]    REPTSTATUS PENDING 03/21/2023 1406    Cardiac Enzymes: No results for input(s): "CKTOTAL", "CKMB", "CKMBINDEX", "TROPONINI" in the last 168 hours. CBG: Recent Labs  Lab 03/24/23 1154 03/24/23 1719 03/24/23 2000 03/25/23 0905 03/25/23 1108  GLUCAP 120* 145* 111* 119* 107*   Iron Studies: No results for input(s): "IRON", "TIBC", "TRANSFERRIN", "FERRITIN" in the last 72 hours. Lab Results  Component Value Date   INR 1.1 03/06/2023   INR 1.1 08/30/2020   Studies/Results: No results found.  Medications:  vancomycin Stopped (03/20/23 2344)    amLODipine  10 mg Oral q morning   atorvastatin  20 mg Oral q morning   Chlorhexidine Gluconate Cloth  6 each Topical Q0600   Chlorhexidine Gluconate Cloth  6 each Topical Q0600   darbepoetin (ARANESP) injection - DIALYSIS  60 mcg Subcutaneous Q Thu-1800   feeding supplement (NEPRO CARB STEADY)  237 mL Oral TID BM   hydrALAZINE  10 mg Oral Q8H   influenza vac split trivalent PF  0.5 mL Intramuscular Tomorrow-1000   insulin aspart  0-9 Units Subcutaneous TID WC   levothyroxine  25 mcg Oral  QAC breakfast   liver oil-zinc oxide   Topical Daily   metoprolol tartrate  50 mg Oral BID   multivitamin with minerals  1 tablet Oral Daily   mupirocin ointment   Nasal BID   pantoprazole  40 mg Oral Daily   polyethylene glycol  17 g Oral Daily   sodium chloride flush  10-40 mL Intracatheter Q12H   sorbitol  30 mL Oral Once    Dialysis Orders: [facility],, MWF. 4 hours. EDW 64 kg. LUE AVG 15-gauge. Flow rates: 400/autoflow 1.5. 3K/2.5 calcium. No heparin. Mircera 75 mcg every 2 weeks (last dose 9/11). Venofer 50 mg q. weekly.   Assessment/Plan: 1.  MRSA and E faecalis bacteremia - ID following. On vancomycin x6 weeks with end date 05/03/23.  Concern for seeding of AVG, VVS removed 10/2 with no obvious infection surround it. Temporary cath placed.  TEE  10/7 with vegetations noted on mitral valve and temporary dialysis catheter.  Patient will require line holiday.  Temp cath removed after HD on 10/8. New TDC placed by VVS today, see below.  Appreciate VVS & ID assistance.  ESRD- HD MWF. Last HD 10/7.  Last K 3.4 and BUN 38.  Plan for HD this afternoon. No acute indications that HD needed today. COVID-19 PNA - Completed remdesivir.  Out of isolation.  Hypokalemia - chronic issue, liberalize diet. UStable. Monitor. Altered mental status - waxing and wanning, appears confused today.  Possible AVG infection- removed on 10/2 and now on line holiday. See above for detailed plan.  HTN/volume -BP elevated. Hx hypOtension but BP now running high.  Under edw, likely weight loss.  UF as tolerated. Plan for lower dry weight on d/c.  Continue metoprolol and amlodipine, hydralazine added 10/8. Anemia of CKD -Hgb ^10.5, ESA restarted on 10/3. Secondary hyperparathyroidism -placed on binders, corrected calcium and phosphorus at goal Nutrition - Carb modified diet w/Fluid restrictions.  DMT2 - per PMD Debility - chronic issue.  Would benefit from SNF placement but previously has refused. Falling at home per caretaker when discussed at outpatient HD. Cared for by 5 year old mother who does her best but can not pick him up when he falls per her own account.  Agreed to SNF placement!  Salome Holmes, NP [facility] 03/25/2023,11:53 AM  LOS: 19 days

## 2023-03-25 NOTE — Op Note (Signed)
NAME: [name] [name]    MRN: [id number] DOB: [birth year]    DATE OF OPERATION: 03/25/2023  PREOP DIAGNOSIS:    ESRD on HD  POSTOP DIAGNOSIS:    Same  PROCEDURE:    Right IJ tunneled dialysis catheter placement  SURGEON: Daria Pastures  ASSIST: None  ANESTHESIA: General/LMA  EBL: Minimal  INDICATIONS:    [name] is a 62 y.o. male who presented with bacteremia.  His left upper extremity AV graft was removed and temporary HD line was placed in his right IJ.  A TEE noted a vegetation on the [facility] so he was given a line holiday and was recently cleared by ID for replacement of a tunneled dialysis catheter.  Risks and benefits were reviewed with the patient and he was willing to proceed.  FINDINGS:   Adequate placement of the right IJ TDC at the cavoatrial junction  TECHNIQUE:   The patient was brought to the operating room positioned supine on the operating room table.  Anesthesia was induced and LMA was placed.  The neck was prepped and draped in the usual sterile fashion.  Preoperative antibiotics were given in the form of Ancef. Under ultrasound guidance the right IJ was accessed and a J-wire was advanced into the IVC under fluoroscopy guidance.  A counterincision was made down on the right lateral chest below the clavicle and a subcutaneous tunnel was created to the IJ access site and a gentle curve fashion and the tunneler with the catheter were passed through this.  The access site was dilated and the peel-away sheath was placed under fluoroscopic guidance into the right atrium.  The catheter was then placed through the sheath and sheath peeled away and removed.  Both lumens aspirated and flushed easily.  The hub of the catheter was stitched in place with 3-0 nylon and a 4-0 Monocryl was used to close the IJ access site incision. A completion shot was obtained which demonstrated a gentle curve of the catheter and adequate placement into the cavoatrial junction. The patient  tolerated procedure well was brought to PACU in stable condition  Daria Pastures, MD [facility] DATE OF DICTATION:   03/25/2023 \

## 2023-03-25 NOTE — Progress Notes (Signed)
Received patient in bed to unit.  Alert and oriented.  Informed consent signed and in chart.   TX duration:2.41  Patient tolerated well.  Transported back to the room  Alert, without acute distress.  Hand-off given to patient's nurse.   Access used: right HD cath Access issues: none  Total UF removed: 1.4L Medication(s) given: vancomycin   03/25/23 1558  Vitals  BP (!) 140/96  MAP (mmHg) 106  BP Location Right Arm  BP Method Automatic  Patient Position (if appropriate) Lying  Pulse Rate 81  ECG Heart Rate 79  Resp 18  Oxygen Therapy  SpO2 100 %  O2 Device Room Air  During Treatment Monitoring  Blood Flow Rate (mL/min) 299 mL/min  Arterial Pressure (mmHg) -122.62 mmHg  Venous Pressure (mmHg) 106.26 mmHg  TMP (mmHg) 17.37 mmHg  Ultrafiltration Rate (mL/min) 1223 mL/min  Dialysate Flow Rate (mL/min) 300 ml/min  Duration of HD Treatment -hour(s) 2.37 hour(s)  Cumulative Fluid Removed (mL) per Treatment  1335.49  HD Safety Checks Performed Yes  Intra-Hemodialysis Comments  (tx terminated due to pt puling lines and trying to climb out of bed and yelling. courtney anderson np aware.)  Dialysis Fluid Bolus Normal Saline  Bolus Amount (mL) 300 mL      [name]

## 2023-03-25 NOTE — Progress Notes (Signed)
PROGRESS NOTE    [name] [name]  MWU:[id number] DOB: 29-Sep-1960 DOA: 03/06/2023 PCP: Farris Has, MD   Brief Narrative: 62 year old with past medical history significant for ESRD on hemodialysis MWF, hypertension, diabetes type 2, pulmonary hypertension, cardiomyopathy, GERD, dyslipidemia, anemia of CKD presents to the ED with worsening mental status.  He was admitted with acute metabolic encephalopathy secondary to UTI and COVID-pneumonia and also missed hemodialysis sections.  He was found to have MRSA and enterococcal bacteremia. He was started on Vancomycin.  ID has been following.  Patient was transferred to [facility] for graft removal on 10/2      Assessment & Plan:   Principal Problem:   Endocarditis of mitral valve Active Problems:   Essential hypertension   Neuropathy   Anemia of chronic disease   Hypokalemia   Hypothyroidism   GERD (gastroesophageal reflux disease)   ESRD (end stage renal disease) (HCC)   Acute metabolic encephalopathy   Protein-calorie malnutrition, severe   MRSA bacteremia   Enterococcal bacteremia   Hemodialysis catheter infection (HCC)   Delirium    MRSA and Enterococcus bacteremia, Mitral Valve Endocarditis, vegetation on [facility] Currently afebrile with no leukocytosis Concern for infection in the AV graft s/p excision of graft 10/2 TEE 10/07: Small linear 2.3 mm mobile vegetation attached to the lateral commissure of the P1 segment of the mitral valve consistent with vegetation.  Dialysis catheter tip in the right atrium with 6.4 mm x 2.8 mm mobile mass consistent with vegetation ID consulted, s/p holiday line after removal on 10/8, replaced TDC on 10/12 by vascular after BC remained NGTD Continue IV vancomycin  COVID infection Tested positive on 9/23 Completed course of Remdesivir, steroid Off isolation since 10/03  ESRD on hemodialysis MWF Managed per nephrology Underwent excision of AV fistula due to infection Temporary HD catheter  with vegetation, ID consulted, s/p holiday line after removal on 10/8, replaced TDC on 10/12 by vascular after BC remained NGTD  Acute metabolic encephalopathy Improved In the setting of UTI/COVID, missed hemodialysis and bacteremia Likely has some cognitive impairment at baseline  Anemia of chronic kidney disease Stable  Hypertension Continue with metoprolol and amlodipine, added hydralazine (refusing meds)  Diabetes type 2 Continue with sliding scale insulin  Hypothyroidism Continue with Synthroid  GERD Continue with PPI  Thrombocytopenia Improving  Hypoalbuminemia On supplements       Nutrition Problem: Severe Malnutrition Etiology: chronic illness    Signs/Symptoms: severe muscle depletion, severe fat depletion    Interventions: Nepro shake  Estimated body mass index is 18.13 kg/m as calculated from the following:   Height as of this encounter: 5\' 10"  (1.778 m).   Weight as of this encounter: 57.3 kg.   DVT prophylaxis: Heparin [place] Code Status: Full code Family Communication: None at bedside Disposition Plan:  Status is: Inpatient Remains inpatient appropriate because: Management of bacteremia.     Consultants:  Nephrology ID Vascular  Procedures:  Graft, TDC removal and re-insertion  Antimicrobials:  Vancomycin  Subjective: Saw pt after TDC placement. Denies any new complaints. Fair mood    Objective: Vitals:   03/25/23 1530 03/25/23 1558 03/25/23 1602 03/25/23 1604  BP: (!) 141/82 (!) 140/96 (!) 175/89   Pulse: 76 81 76   Resp: 11 18 15    Temp:      TempSrc:      SpO2: 100% 100% 100%   Weight:    57.3 kg  Height:        Intake/Output Summary (Last 24 hours) at 03/25/2023  1613 Last data filed at 03/25/2023 1602 Gross per 24 hour  Intake 320 ml  Output 1405 ml  Net -1085 ml   Filed Weights   03/25/23 0728 03/25/23 1251 03/25/23 1604  Weight: 60.9 kg 58.9 kg 57.3 kg    Examination: General: NAD, chronically ill  appearing  Cardiovascular: S1, S2 present Respiratory: CTAB Abdomen: Soft, nontender, nondistended, bowel sounds present Musculoskeletal: No bilateral pedal edema noted Skin: Normal Psychiatry: fair mood     Data Reviewed: I have personally reviewed following labs and imaging studies  CBC: Recent Labs  Lab 03/19/23 1450 03/20/23 0534 03/22/23 1058 03/23/23 0917 03/25/23 1302  WBC 7.5 5.6 6.3 7.1 3.9*  NEUTROABS  --   --  4.7 4.9 2.4  HGB 10.1* 8.8* 10.0* 10.5* 9.3*  HCT 30.7* 26.8* 31.7* 32.7* 29.6*  MCV 88.5 89.6 88.5 91.1 90.8  PLT 151 126* 137* 167 141*   Basic Metabolic Panel: Recent Labs  Lab 03/20/23 0534 03/22/23 1058 03/23/23 0917 03/24/23 0447 03/25/23 1302  NA 134* 138 139 139 143  K 3.1* 4.1 3.9 3.4* 4.0  CL 98 99 101 100 103  CO2 26 24 23 25 24   GLUCOSE 114* 118* 128* 112* 136*  BUN 21 25* 32* 38* 44*  CREATININE 4.52* 4.85* 5.67* 6.30* 7.56*  CALCIUM 7.4* 8.0* 8.6* 8.2* 8.5*  PHOS  --   --   --   --  3.5   GFR: Estimated Creatinine Clearance: 8 mL/min (A) (by C-G formula based on SCr of 7.56 mg/dL (H)). Liver Function Tests: No results for input(s): "AST", "ALT", "ALKPHOS", "BILITOT", "PROT", "ALBUMIN" in the last 168 hours.  No results for input(s): "LIPASE", "AMYLASE" in the last 168 hours. No results for input(s): "AMMONIA" in the last 168 hours. Coagulation Profile: No results for input(s): "INR", "PROTIME" in the last 168 hours. Cardiac Enzymes: No results for input(s): "CKTOTAL", "CKMB", "CKMBINDEX", "TROPONINI" in the last 168 hours. BNP (last 3 results) No results for input(s): "PROBNP" in the last 8760 hours. HbA1C: No results for input(s): "HGBA1C" in the last 72 hours. CBG: Recent Labs  Lab 03/24/23 1154 03/24/23 1719 03/24/23 2000 03/25/23 0905 03/25/23 1108  GLUCAP 120* 145* 111* 119* 107*   Lipid Profile: No results for input(s): "CHOL", "HDL", "LDLCALC", "TRIG", "CHOLHDL", "LDLDIRECT" in the last 72 hours. Thyroid  Function Tests: No results for input(s): "TSH", "T4TOTAL", "FREET4", "T3FREE", "THYROIDAB" in the last 72 hours. Anemia Panel: No results for input(s): "VITAMINB12", "FOLATE", "FERRITIN", "TIBC", "IRON", "RETICCTPCT" in the last 72 hours. Sepsis Labs: No results for input(s): "PROCALCITON", "LATICACIDVEN" in the last 168 hours.  Recent Results (from the past 240 hour(s))  Culture, blood (Routine X 2) w Reflex to ID Panel     Status: None (Preliminary result)   Collection Time: 03/21/23  2:03 PM   Specimen: BLOOD RIGHT HAND  Result Value Ref Range Status   Specimen Description BLOOD RIGHT HAND  Final   Special Requests   Final    BOTTLES DRAWN AEROBIC AND ANAEROBIC Blood Culture adequate volume   Culture   Final    NO GROWTH 4 DAYS Performed at [facility], [address], [place], [place] [address]    Report Status PENDING  Incomplete  Culture, blood (Routine X 2) w Reflex to ID Panel     Status: None (Preliminary result)   Collection Time: 03/21/23  2:06 PM   Specimen: BLOOD RIGHT ARM  Result Value Ref Range Status   Specimen Description BLOOD RIGHT  ARM  Final   Special Requests   Final    BOTTLES DRAWN AEROBIC AND ANAEROBIC Blood Culture adequate volume   Culture   Final    NO GROWTH 4 DAYS Performed at [facility], [address], [place], [place] [address]    Report Status PENDING  Incomplete         Radiology Studies: DG CHEST PORT 1 VIEW  Result Date: 03/25/2023 CLINICAL DATA:  [id number] ESRD (end stage renal disease) (HCC) [id number] EXAM: PORTABLE CHEST - 1 VIEW COMPARISON:  03/20/2023 FINDINGS: Tunneled right IJ hemodialysis catheter is been placed to the cavoatrial junction. No pneumothorax. Lungs clear. Heart size and mediastinal contours are within normal limits. Aortic Atherosclerosis (ICD10-170.0). No effusion. Surgical staples left axilla. IMPRESSION: No acute cardiopulmonary disease. Electronically Signed   By: Corlis Leak M.D.   On: 03/25/2023 13:35    DG C-Arm 1-60 Min  Result Date: 03/25/2023 CLINICAL DATA:  surgery EXAM: DG C-ARM 1-60 MIN COMPARISON:  None Available. FINDINGS: Single fluoroscopic spot image documents tunneled right IJ catheter to the cavoatrial junction. No visualized pneumothorax. IMPRESSION: Right IJ catheter to cavoatrial junction. Electronically Signed   By: Corlis Leak M.D.   On: 03/25/2023 13:35        Scheduled Meds:  amLODipine  10 mg Oral q morning   atorvastatin  20 mg Oral q morning   Chlorhexidine Gluconate Cloth  6 each Topical Q0600   Chlorhexidine Gluconate Cloth  6 each Topical Q0600   darbepoetin (ARANESP) injection - DIALYSIS  60 mcg Subcutaneous Q Thu-1800   feeding supplement (NEPRO CARB STEADY)  237 mL Oral TID BM   hydrALAZINE  10 mg Oral Q8H   influenza vac split trivalent PF  0.5 mL Intramuscular Tomorrow-1000   insulin aspart  0-9 Units Subcutaneous TID WC   levothyroxine  25 mcg Oral QAC breakfast   liver oil-zinc oxide   Topical Daily   metoprolol tartrate  50 mg Oral BID   multivitamin with minerals  1 tablet Oral Daily   mupirocin ointment   Nasal BID   pantoprazole  40 mg Oral Daily   polyethylene glycol  17 g Oral Daily   sodium chloride flush  10-40 mL Intracatheter Q12H   sorbitol  30 mL Oral Once   Continuous Infusions:  vancomycin 750 mg (03/25/23 1459)     LOS: 19 days      Briant Cedar, MD [facility]   If 7PM-7AM, please contact night-coverage [email or web address]  03/25/2023, 4:13 PM

## 2023-03-25 NOTE — Anesthesia Procedure Notes (Signed)
Procedure Name: LMA Insertion Date/Time: 03/25/2023 8:54 AM  Performed by: Drema Pry, CRNAPre-anesthesia Checklist: Patient identified, Emergency Drugs available, Suction available and Patient being monitored Patient Re-evaluated:Patient Re-evaluated prior to induction Oxygen Delivery Method: Circle System Utilized Preoxygenation: Pre-oxygenation with 100% oxygen Induction Type: IV induction Ventilation: Mask ventilation without difficulty LMA: LMA inserted LMA Size: 4.0 Number of attempts: 1 Airway Equipment and Method: Bite block Placement Confirmation: positive ETCO2 Tube secured with: Tape Dental Injury: Teeth and Oropharynx as per pre-operative assessment

## 2023-03-26 ENCOUNTER — Encounter (HOSPITAL_COMMUNITY): Payer: Self-pay | Admitting: Vascular Surgery

## 2023-03-26 DIAGNOSIS — I059 Rheumatic mitral valve disease, unspecified: Secondary | ICD-10-CM | POA: Diagnosis not present

## 2023-03-26 LAB — CBC WITH DIFFERENTIAL/PLATELET
Abs Immature Granulocytes: 0.01 10*3/uL (ref 0.00–0.07)
Basophils Absolute: 0 10*3/uL (ref 0.0–0.1)
Basophils Relative: 1 %
Eosinophils Absolute: 0 10*3/uL (ref 0.0–0.5)
Eosinophils Relative: 1 %
HCT: 32 % — ABNORMAL LOW (ref 39.0–52.0)
Hemoglobin: 10.1 g/dL — ABNORMAL LOW (ref 13.0–17.0)
Immature Granulocytes: 0 %
Lymphocytes Relative: 34 %
Lymphs Abs: 1.6 10*3/uL (ref 0.7–4.0)
MCH: 28.9 pg (ref 26.0–34.0)
MCHC: 31.6 g/dL (ref 30.0–36.0)
MCV: 91.7 fL (ref 80.0–100.0)
Monocytes Absolute: 0.4 10*3/uL (ref 0.1–1.0)
Monocytes Relative: 8 %
Neutro Abs: 2.6 10*3/uL (ref 1.7–7.7)
Neutrophils Relative %: 56 %
Platelets: 128 10*3/uL — ABNORMAL LOW (ref 150–400)
RBC: 3.49 MIL/uL — ABNORMAL LOW (ref 4.22–5.81)
RDW: 15.1 % (ref 11.5–15.5)
WBC: 4.7 10*3/uL (ref 4.0–10.5)
nRBC: 0 % (ref 0.0–0.2)

## 2023-03-26 LAB — BASIC METABOLIC PANEL
Anion gap: 13 (ref 5–15)
BUN: 24 mg/dL — ABNORMAL HIGH (ref 8–23)
CO2: 22 mmol/L (ref 22–32)
Calcium: 8.2 mg/dL — ABNORMAL LOW (ref 8.9–10.3)
Chloride: 101 mmol/L (ref 98–111)
Creatinine, Ser: 4.57 mg/dL — ABNORMAL HIGH (ref 0.61–1.24)
GFR, Estimated: 14 mL/min — ABNORMAL LOW (ref >60)
Glucose, Bld: 78 mg/dL (ref 70–99)
Potassium: 3.8 mmol/L (ref 3.5–5.1)
Sodium: 136 mmol/L (ref 135–145)

## 2023-03-26 LAB — CULTURE, BLOOD (ROUTINE X 2)
Culture: NO GROWTH
Culture: NO GROWTH
Special Requests: ADEQUATE
Special Requests: ADEQUATE

## 2023-03-26 LAB — GLUCOSE, CAPILLARY
Glucose-Capillary: 75 mg/dL (ref 70–99)
Glucose-Capillary: 100 mg/dL — ABNORMAL HIGH (ref 70–99)
Glucose-Capillary: 137 mg/dL — ABNORMAL HIGH (ref 70–99)

## 2023-03-26 MED ORDER — CHLORHEXIDINE GLUCONATE CLOTH 2 % EX PADS
6.0000 | MEDICATED_PAD | Freq: Every day | CUTANEOUS | Status: DC
  Administered 2023-03-27 – 2023-03-28 (×2): 6 via TOPICAL

## 2023-03-26 NOTE — Progress Notes (Signed)
  Daily Progress Note  S/p:R internal jugular TDC placement  Subjective: No complaints  Objective: Vitals:   03/25/23 1938 03/26/23 0347  BP: (!) 146/75 135/73  Pulse: 69 63  Resp: 16 18  Temp: 97.6 F (36.4 C) 97.7 F (36.5 C)  SpO2: 100% 100%    Physical Examination Right IJ TDC access site clean dry without hematoma, erythema or drainage  ASSESSMENT/PLAN:  62 year old male with ESRD who is status post left AVG excision due to infection.  He since been cleared by ID for line placement and underwent right IJ TDC placement on 03/25/2023. Okay for HD via tunneled dialysis catheter Please call back with any future questions or concerns.   Daria Pastures MD MS [facility] [phone] 03/26/2023  8:34 AM

## 2023-03-26 NOTE — Progress Notes (Signed)
PROGRESS NOTE    [name] [name]  ZOX:[id number] DOB: [birth year] DOA: 03/06/2023 PCP: Farris Has, MD   Brief Narrative: 62 year old with past medical history significant for ESRD on hemodialysis MWF, hypertension, diabetes type 2, pulmonary hypertension, cardiomyopathy, GERD, dyslipidemia, anemia of CKD presents to the ED with worsening mental status.  He was admitted with acute metabolic encephalopathy secondary to UTI and COVID-pneumonia and also missed hemodialysis sections.  He was found to have MRSA and enterococcal bacteremia. He was started on Vancomycin.  ID has been following.  Patient was transferred to [facility] for graft removal     Assessment & Plan:   Principal Problem:   Endocarditis of mitral valve Active Problems:   Essential hypertension   Neuropathy   Anemia of chronic disease   Hypokalemia   Hypothyroidism   GERD (gastroesophageal reflux disease)   ESRD (end stage renal disease) (HCC)   Acute metabolic encephalopathy   Protein-calorie malnutrition, severe   MRSA bacteremia   Enterococcal bacteremia   Hemodialysis catheter infection (HCC)   Delirium    MRSA and Enterococcus bacteremia, Mitral Valve Endocarditis, vegetation on [facility] Currently afebrile with no leukocytosis Concern for infection in the AV graft s/p excision of graft 10/2 TEE 10/07: Small linear 2.3 mm mobile vegetation attached to the lateral commissure of the P1 segment of the mitral valve consistent with vegetation.  Dialysis catheter tip in the right atrium with 6.4 mm x 2.8 mm mobile mass consistent with vegetation ID consulted, s/p holiday line after removal on 10/8, replaced TDC on 10/12 by vascular after BC remained NGTD Continue IV vancomycin  COVID infection Tested positive on 9/23 Completed course of Remdesivir, steroid Off isolation since 10/03  ESRD on hemodialysis MWF Managed per nephrology Underwent excision of AV fistula due to infection Temporary HD catheter with  vegetation, ID consulted, s/p holiday line after removal on 10/8, replaced TDC on 10/12 by vascular after BC remained NGTD  Acute metabolic encephalopathy Improved In the setting of UTI/COVID, missed hemodialysis and bacteremia Likely has some cognitive impairment at baseline  Anemia of chronic kidney disease Stable  Hypertension Continue with metoprolol and amlodipine, added hydralazine (refusing meds)  Diabetes type 2 Continue with sliding scale insulin  Hypothyroidism Continue with Synthroid  GERD Continue with PPI  Thrombocytopenia Improving  Hypoalbuminemia On supplements       Nutrition Problem: Severe Malnutrition Etiology: chronic illness    Signs/Symptoms: severe muscle depletion, severe fat depletion    Interventions: Nepro shake  Estimated body mass index is 18.13 kg/m as calculated from the following:   Height as of this encounter: 5\' 10"  (1.778 m).   Weight as of this encounter: 57.3 kg.   DVT prophylaxis: Heparin  Code Status: Full code Family Communication: None at bedside Disposition Plan:  Status is: Inpatient Remains inpatient appropriate because: Management of bacteremia.     Consultants:  Nephrology ID Vascular  Procedures:  Graft, TDC removal and re-insertion  Antimicrobials:  Vancomycin  Subjective: Pt has a poor mood.. Wants to go home. Was noted to be pulling out lines/agitated after HD on 10/12. Remains stable and cooperative on the floor. Intermittently refuses meds and lab draw     Objective: Vitals:   03/25/23 1639 03/25/23 1938 03/26/23 0347 03/26/23 0857  BP: (!) 158/84 (!) 146/75 135/73 (!) 140/78  Pulse: 71 69 63 79  Resp: 18 16 18 18   Temp: 98 F (36.7 C) 97.6 F (36.4 C) 97.7 F (36.5 C) 98 F (36.7 C)  TempSrc:  Oral    SpO2:  100% 100% 100%  Weight:      Height:        Intake/Output Summary (Last 24 hours) at 03/26/2023 1525 Last data filed at 03/26/2023 0820 Gross per 24 hour  Intake  329.9 ml  Output 1401 ml  Net -1071.1 ml   Filed Weights   03/25/23 0728 03/25/23 1251 03/25/23 1604  Weight: 60.9 kg 58.9 kg 57.3 kg    Examination: General: NAD, chronically ill appearing  Cardiovascular: S1, S2 present Respiratory: CTAB Abdomen: Soft, nontender, nondistended, bowel sounds present Musculoskeletal: No bilateral pedal edema noted Skin: Normal Psychiatry: fair mood     Data Reviewed: I have personally reviewed following labs and imaging studies  CBC: Recent Labs  Lab 03/20/23 0534 03/22/23 1058 03/23/23 0917 03/25/23 1302 03/26/23 0851  WBC 5.6 6.3 7.1 3.9* 4.7  NEUTROABS  --  4.7 4.9 2.4 2.6  HGB 8.8* 10.0* 10.5* 9.3* 10.1*  HCT 26.8* 31.7* 32.7* 29.6* 32.0*  MCV 89.6 88.5 91.1 90.8 91.7  PLT 126* 137* 167 141* 128*   Basic Metabolic Panel: Recent Labs  Lab 03/22/23 1058 03/23/23 0917 03/24/23 0447 03/25/23 1302 03/26/23 0851  NA 138 139 139 143 136  K 4.1 3.9 3.4* 4.0 3.8  CL 99 101 100 103 101  CO2 24 23 25 24 22   GLUCOSE 118* 128* 112* 136* 78  BUN 25* 32* 38* 44* 24*  CREATININE 4.85* 5.67* 6.30* 7.56* 4.57*  CALCIUM 8.0* 8.6* 8.2* 8.5* 8.2*  PHOS  --   --   --  3.5  --    GFR: Estimated Creatinine Clearance: 13.2 mL/min (A) (by C-G formula based on SCr of 4.57 mg/dL (H)). Liver Function Tests: No results for input(s): "AST", "ALT", "ALKPHOS", "BILITOT", "PROT", "ALBUMIN" in the last 168 hours.  No results for input(s): "LIPASE", "AMYLASE" in the last 168 hours. No results for input(s): "AMMONIA" in the last 168 hours. Coagulation Profile: No results for input(s): "INR", "PROTIME" in the last 168 hours. Cardiac Enzymes: No results for input(s): "CKTOTAL", "CKMB", "CKMBINDEX", "TROPONINI" in the last 168 hours. BNP (last 3 results) No results for input(s): "PROBNP" in the last 8760 hours. HbA1C: No results for input(s): "HGBA1C" in the last 72 hours. CBG: Recent Labs  Lab 03/25/23 1108 03/25/23 1637 03/25/23 2107  03/26/23 0723 03/26/23 1108  GLUCAP 107* 97 96 75 100*   Lipid Profile: No results for input(s): "CHOL", "HDL", "LDLCALC", "TRIG", "CHOLHDL", "LDLDIRECT" in the last 72 hours. Thyroid Function Tests: No results for input(s): "TSH", "T4TOTAL", "FREET4", "T3FREE", "THYROIDAB" in the last 72 hours. Anemia Panel: No results for input(s): "VITAMINB12", "FOLATE", "FERRITIN", "TIBC", "IRON", "RETICCTPCT" in the last 72 hours. Sepsis Labs: No results for input(s): "PROCALCITON", "LATICACIDVEN" in the last 168 hours.  Recent Results (from the past 240 hour(s))  Culture, blood (Routine X 2) w Reflex to ID Panel     Status: None   Collection Time: 03/21/23  2:03 PM   Specimen: BLOOD RIGHT HAND  Result Value Ref Range Status   Specimen Description BLOOD RIGHT HAND  Final   Special Requests   Final    BOTTLES DRAWN AEROBIC AND ANAEROBIC Blood Culture adequate volume   Culture   Final    NO GROWTH 5 DAYS Performed at [facility], [address] [place], [place] [address]    Report Status 03/26/2023 FINAL  Final  Culture, blood (Routine X 2) w Reflex to ID Panel     Status: None  Collection Time: 03/21/23  2:06 PM   Specimen: BLOOD RIGHT ARM  Result Value Ref Range Status   Specimen Description BLOOD RIGHT ARM  Final   Special Requests   Final    BOTTLES DRAWN AEROBIC AND ANAEROBIC Blood Culture adequate volume   Culture   Final    NO GROWTH 5 DAYS Performed at [facility], [address], [place], [place] [address]    Report Status 03/26/2023 FINAL  Final         Radiology Studies: DG CHEST PORT 1 VIEW  Result Date: 03/25/2023 CLINICAL DATA:  201257 ESRD (end stage renal disease) (HCC) [id number] EXAM: PORTABLE CHEST - 1 VIEW COMPARISON:  03/20/2023 FINDINGS: Tunneled right IJ hemodialysis catheter is been placed to the cavoatrial junction. No pneumothorax. Lungs clear. Heart size and mediastinal contours are within normal limits. Aortic Atherosclerosis (ICD10-170.0).  No effusion. Surgical staples left axilla. IMPRESSION: No acute cardiopulmonary disease. Electronically Signed   By: Corlis Leak M.D.   On: 03/25/2023 13:35   DG C-Arm 1-60 Min  Result Date: 03/25/2023 CLINICAL DATA:  surgery EXAM: DG C-ARM 1-60 MIN COMPARISON:  None Available. FINDINGS: Single fluoroscopic spot image documents tunneled right IJ catheter to the cavoatrial junction. No visualized pneumothorax. IMPRESSION: Right IJ catheter to cavoatrial junction. Electronically Signed   By: Corlis Leak M.D.   On: 03/25/2023 13:35        Scheduled Meds:  amLODipine  10 mg Oral q morning   atorvastatin  20 mg Oral q morning   [START ON 03/27/2023] Chlorhexidine Gluconate Cloth  6 each Topical Q0600   darbepoetin (ARANESP) injection - DIALYSIS  60 mcg Subcutaneous Q Thu-1800   feeding supplement (NEPRO CARB STEADY)  237 mL Oral TID BM   heparin injection (subcutaneous)  5,000 Units Subcutaneous Q8H   hydrALAZINE  10 mg Oral Q8H   insulin aspart  0-9 Units Subcutaneous TID WC   levothyroxine  25 mcg Oral QAC breakfast   liver oil-zinc oxide   Topical Daily   metoprolol tartrate  50 mg Oral BID   multivitamin with minerals  1 tablet Oral Daily   pantoprazole  40 mg Oral Daily   polyethylene glycol  17 g Oral Daily   sodium chloride flush  10-40 mL Intracatheter Q12H   Continuous Infusions:  vancomycin Stopped (03/25/23 1556)     LOS: 20 days      Briant Cedar, MD [facility]   If 7PM-7AM, please contact night-coverage [email or web address]  03/26/2023, 3:25 PM

## 2023-03-26 NOTE — Progress Notes (Signed)
[facility] Progress Note   Subjective:    S/p new TDC placed 10/12 by VVS. Informed of patient yelling, trying to get out of bed, and pulling lines near end of HD yesterday. Doesn't appear he had behavioral issues on HD previously after reviewing notes. Noted net UF 1.3L. Seen and examined at bedside. Appears calm and comfortable. Next HD 10/14. Will need to monitor his behavior closely on HD.  Objective Vitals:   03/25/23 1639 03/25/23 1938 03/26/23 0347 03/26/23 0857  BP: (!) 158/84 (!) 146/75 135/73 (!) 140/78  Pulse: 71 69 63 79  Resp: 18 16 18 18   Temp: 98 F (36.7 C) 97.6 F (36.4 C) 97.7 F (36.5 C) 98 F (36.7 C)  TempSrc:  Oral    SpO2:  100% 100% 100%  Weight:      Height:       Physical Exam General:chronically ill appearing, thin male in NAD Heart:RRR, no mrg Lungs:CTAB, nml WOB on RA Abdomen:soft, NTND Extremities:no LE edema Dialysis Access: R TDC (placed 10/12)  Filed Weights   03/25/23 0728 03/25/23 1251 03/25/23 1604  Weight: 60.9 kg 58.9 kg 57.3 kg    Intake/Output Summary (Last 24 hours) at 03/26/2023 1127 Last data filed at 03/26/2023 0820 Gross per 24 hour  Intake 449.9 ml  Output 1401 ml  Net -951.1 ml    Additional Objective Labs: Basic Metabolic Panel: Recent Labs  Lab 03/24/23 0447 03/25/23 1302 03/26/23 0851  NA 139 143 136  K 3.4* 4.0 3.8  CL 100 103 101  CO2 25 24 22   GLUCOSE 112* 136* 78  BUN 38* 44* 24*  CREATININE 6.30* 7.56* 4.57*  CALCIUM 8.2* 8.5* 8.2*  PHOS  --  3.5  --    Liver Function Tests: No results for input(s): "AST", "ALT", "ALKPHOS", "BILITOT", "PROT", "ALBUMIN" in the last 168 hours. No results for input(s): "LIPASE", "AMYLASE" in the last 168 hours. CBC: Recent Labs  Lab 03/20/23 0534 03/20/23 0534 03/22/23 1058 03/23/23 0917 03/25/23 1302 03/26/23 0851  WBC 5.6  --  6.3 7.1 3.9* 4.7  NEUTROABS  --    < > 4.7 4.9 2.4 2.6  HGB 8.8*  --  10.0* 10.5* 9.3* 10.1*  HCT 26.8*  --   31.7* 32.7* 29.6* 32.0*  MCV 89.6  --  88.5 91.1 90.8 91.7  PLT 126*  --  137* 167 141* 128*   < > = values in this interval not displayed.   Blood Culture    Component Value Date/Time   SDES BLOOD RIGHT ARM 03/21/2023 1406   SPECREQUEST  03/21/2023 1406    BOTTLES DRAWN AEROBIC AND ANAEROBIC Blood Culture adequate volume   CULT  03/21/2023 1406    NO GROWTH 5 DAYS Performed at [facility], [address] [place], [place] [address]    REPTSTATUS 03/26/2023 FINAL 03/21/2023 1406    Cardiac Enzymes: No results for input(s): "CKTOTAL", "CKMB", "CKMBINDEX", "TROPONINI" in the last 168 hours. CBG: Recent Labs  Lab 03/25/23 1108 03/25/23 1637 03/25/23 2107 03/26/23 0723 03/26/23 1108  GLUCAP 107* 97 96 75 100*   Iron Studies: No results for input(s): "IRON", "TIBC", "TRANSFERRIN", "FERRITIN" in the last 72 hours. Lab Results  Component Value Date   INR 1.1 03/06/2023   INR 1.1 08/30/2020   Studies/Results: DG CHEST PORT 1 VIEW  Result Date: 03/25/2023 CLINICAL DATA:  201257 ESRD (end stage renal disease) (HCC) [id number] EXAM: PORTABLE CHEST - 1 VIEW COMPARISON:  03/20/2023 FINDINGS: Tunneled right  IJ hemodialysis catheter is been placed to the cavoatrial junction. No pneumothorax. Lungs clear. Heart size and mediastinal contours are within normal limits. Aortic Atherosclerosis (ICD10-170.0). No effusion. Surgical staples left axilla. IMPRESSION: No acute cardiopulmonary disease. Electronically Signed   By: Corlis Leak M.D.   On: 03/25/2023 13:35   DG C-Arm 1-60 Min  Result Date: 03/25/2023 CLINICAL DATA:  surgery EXAM: DG C-ARM 1-60 MIN COMPARISON:  None Available. FINDINGS: Single fluoroscopic spot image documents tunneled right IJ catheter to the cavoatrial junction. No visualized pneumothorax. IMPRESSION: Right IJ catheter to cavoatrial junction. Electronically Signed   By: Corlis Leak M.D.   On: 03/25/2023 13:35    Medications:  vancomycin Stopped (03/25/23 1556)     amLODipine  10 mg Oral q morning   atorvastatin  20 mg Oral q morning   Chlorhexidine Gluconate Cloth  6 each Topical Q0600   darbepoetin (ARANESP) injection - DIALYSIS  60 mcg Subcutaneous Q Thu-1800   feeding supplement (NEPRO CARB STEADY)  237 mL Oral TID BM   heparin injection (subcutaneous)  5,000 Units Subcutaneous Q8H   hydrALAZINE  10 mg Oral Q8H   insulin aspart  0-9 Units Subcutaneous TID WC   levothyroxine  25 mcg Oral QAC breakfast   liver oil-zinc oxide   Topical Daily   metoprolol tartrate  50 mg Oral BID   multivitamin with minerals  1 tablet Oral Daily   pantoprazole  40 mg Oral Daily   polyethylene glycol  17 g Oral Daily   sodium chloride flush  10-40 mL Intracatheter Q12H    Dialysis Orders: [facility],, MWF. 4 hours. EDW 64 kg. LUE AVG 15-gauge. Flow rates: 400/autoflow 1.5. 3K/2.5 calcium. No heparin. Mircera 75 mcg every 2 weeks (last dose 9/11). Venofer 50 mg q. weekly.   Assessment/Plan: 1.  MRSA and E faecalis bacteremia - ID following. On vancomycin x6 weeks with end date 05/03/23.  Concern for seeding of AVG, VVS removed 10/2 with no obvious infection surround it. Temporary cath placed.  TEE 10/7 with vegetations noted on mitral valve and temporary dialysis catheter.  Temp cath removed after HD on 10/8 and he underwent a line holiday. New R TDC placed by VVS 10/12, see below.  Appreciate VVS & ID assistance.  ESRD- HD MWF. Next HD 10/14 per his routine schedule. COVID-19 PNA - Completed remdesivir.  Out of isolation.  Hypokalemia - chronic issue, liberalize diet. K+ now 3.8. Monitor. Altered mental status - waxing and wanning. Informed he was yelling, pulling at lines, and trying to get out of bed near end of HD yesterday. Will need to monitor his behavior while on treatment closely. Will see how he does tomorrow. Possible AVG infection- removed on 10/2 and now with new TDC, see above.  HTN/volume -Hx of hypotension but BP now running high. Euvolemic on exam.  Under edw, likely weight loss. UF as tolerated. Plan for lower dry weight on d/c.  Continue metoprolol and amlodipine, hydralazine added 10/8. Anemia of CKD -Hgb 10.1, ESA restarted on 10/3. Secondary hyperparathyroidism -placed on binders, last corrected calcium and phosphorus at goal. Checking labs in AM. Nutrition - Carb modified diet w/Fluid restrictions.  DMT2 - per PMD Debility - chronic issue.  Would benefit from SNF placement but previously has refused. Falling at home per caretaker when discussed at outpatient HD. He fell over the weekend as well and didn't sustain any acute injuries. Cared for by 4 year old mother who does her best but can not pick  him up when he falls per her own account.  Agreed to SNF placement!  Salome Holmes, NP [facility] 03/26/2023,11:27 AM  LOS: 20 days

## 2023-03-26 NOTE — Plan of Care (Signed)
  Problem: Education: Goal: Knowledge of General Education information will improve Description: Including pain rating scale, medication(s)/side effects and non-pharmacologic comfort measures Outcome: Progressing   Problem: Health Behavior/Discharge Planning: Goal: Ability to manage health-related needs will improve Outcome: Progressing   Problem: Clinical Measurements: Goal: Ability to maintain clinical measurements within normal limits will improve Outcome: Progressing Goal: Will remain free from infection Outcome: Progressing Goal: Diagnostic test results will improve Outcome: Progressing Goal: Respiratory complications will improve Outcome: Progressing Goal: Cardiovascular complication will be avoided Outcome: Progressing   Problem: Activity: Goal: Risk for activity intolerance will decrease Outcome: Progressing   Problem: Nutrition: Goal: Adequate nutrition will be maintained Outcome: Progressing   Problem: Coping: Goal: Level of anxiety will decrease Outcome: Progressing   Problem: Elimination: Goal: Will not experience complications related to bowel motility Outcome: Progressing Goal: Will not experience complications related to urinary retention Outcome: Progressing   Problem: Pain Managment: Goal: General experience of comfort will improve Outcome: Progressing   Problem: Safety: Goal: Ability to remain free from injury will improve Outcome: Progressing   Problem: Skin Integrity: Goal: Risk for impaired skin integrity will decrease Outcome: Progressing   Problem: Education: Goal: Knowledge of risk factors and measures for prevention of condition will improve Outcome: Progressing   Problem: Coping: Goal: Psychosocial and spiritual needs will be supported Outcome: Progressing   Problem: Respiratory: Goal: Will maintain a patent airway Outcome: Progressing Goal: Complications related to the disease process, condition or treatment will be avoided or  minimized Outcome: Progressing   Problem: Education: Goal: Ability to describe self-care measures that may prevent or decrease complications (Diabetes Survival Skills Education) will improve Outcome: Progressing Goal: Individualized Educational Video(s) Outcome: Progressing   Problem: Coping: Goal: Ability to adjust to condition or change in health will improve Outcome: Progressing   Problem: Fluid Volume: Goal: Ability to maintain a balanced intake and output will improve Outcome: Progressing   Problem: Health Behavior/Discharge Planning: Goal: Ability to identify and utilize available resources and services will improve Outcome: Progressing Goal: Ability to manage health-related needs will improve Outcome: Progressing   Problem: Metabolic: Goal: Ability to maintain appropriate glucose levels will improve Outcome: Progressing   Problem: Nutritional: Goal: Maintenance of adequate nutrition will improve Outcome: Progressing Goal: Progress toward achieving an optimal weight will improve Outcome: Progressing   Problem: Skin Integrity: Goal: Risk for impaired skin integrity will decrease Outcome: Progressing   Problem: Tissue Perfusion: Goal: Adequacy of tissue perfusion will improve Outcome: Progressing   Problem: Education: Goal: Knowledge of disease and its progression will improve Outcome: Progressing   Problem: Fluid Volume: Goal: Compliance with measures to maintain balanced fluid volume will improve Outcome: Progressing   Problem: Health Behavior/Discharge Planning: Goal: Ability to manage health-related needs will improve Outcome: Progressing   Problem: Nutritional: Goal: Ability to make healthy dietary choices will improve Outcome: Progressing   Problem: Clinical Measurements: Goal: Complications related to the disease process, condition or treatment will be avoided or minimized Outcome: Progressing   Problem: Safety: Goal: Non-violent  Restraint(s) Outcome: Progressing

## 2023-03-26 NOTE — Plan of Care (Signed)

## 2023-03-27 DIAGNOSIS — T827XXA Infection and inflammatory reaction due to other cardiac and vascular devices, implants and grafts, initial encounter: Secondary | ICD-10-CM

## 2023-03-27 DIAGNOSIS — I059 Rheumatic mitral valve disease, unspecified: Secondary | ICD-10-CM | POA: Diagnosis not present

## 2023-03-27 LAB — RENAL FUNCTION PANEL
Albumin: 2 g/dL — ABNORMAL LOW (ref 3.5–5.0)
Anion gap: 13 (ref 5–15)
BUN: 35 mg/dL — ABNORMAL HIGH (ref 8–23)
CO2: 23 mmol/L (ref 22–32)
Calcium: 8 mg/dL — ABNORMAL LOW (ref 8.9–10.3)
Chloride: 100 mmol/L (ref 98–111)
Creatinine, Ser: 5.75 mg/dL — ABNORMAL HIGH (ref 0.61–1.24)
GFR, Estimated: 10 mL/min — ABNORMAL LOW (ref >60)
Glucose, Bld: 126 mg/dL — ABNORMAL HIGH (ref 70–99)
Phosphorus: 3.1 mg/dL (ref 2.5–4.6)
Potassium: 3.8 mmol/L (ref 3.5–5.1)
Sodium: 136 mmol/L (ref 135–145)

## 2023-03-27 LAB — CBC WITH DIFFERENTIAL/PLATELET
Abs Immature Granulocytes: 0.01 10*3/uL (ref 0.00–0.07)
Basophils Absolute: 0.1 10*3/uL (ref 0.0–0.1)
Basophils Relative: 2 %
Eosinophils Absolute: 0 10*3/uL (ref 0.0–0.5)
Eosinophils Relative: 1 %
HCT: 28.5 % — ABNORMAL LOW (ref 39.0–52.0)
Hemoglobin: 8.9 g/dL — ABNORMAL LOW (ref 13.0–17.0)
Immature Granulocytes: 0 %
Lymphocytes Relative: 32 %
Lymphs Abs: 1.3 10*3/uL (ref 0.7–4.0)
MCH: 28.1 pg (ref 26.0–34.0)
MCHC: 31.2 g/dL (ref 30.0–36.0)
MCV: 89.9 fL (ref 80.0–100.0)
Monocytes Absolute: 0.4 10*3/uL (ref 0.1–1.0)
Monocytes Relative: 9 %
Neutro Abs: 2.2 10*3/uL (ref 1.7–7.7)
Neutrophils Relative %: 56 %
Platelets: 118 10*3/uL — ABNORMAL LOW (ref 150–400)
RBC: 3.17 MIL/uL — ABNORMAL LOW (ref 4.22–5.81)
RDW: 15.2 % (ref 11.5–15.5)
WBC: 4 10*3/uL (ref 4.0–10.5)
nRBC: 0 % (ref 0.0–0.2)

## 2023-03-27 LAB — GLUCOSE, CAPILLARY
Glucose-Capillary: 118 mg/dL — ABNORMAL HIGH (ref 70–99)
Glucose-Capillary: 148 mg/dL — ABNORMAL HIGH (ref 70–99)
Glucose-Capillary: 268 mg/dL — ABNORMAL HIGH (ref 70–99)
Glucose-Capillary: 85 mg/dL (ref 70–99)

## 2023-03-27 LAB — VANCOMYCIN, RANDOM: Vancomycin Rm: 23 ug/mL

## 2023-03-27 MED ORDER — PENTAFLUOROPROP-TETRAFLUOROETH EX AERO: 1.0000 | INHALATION_SPRAY | CUTANEOUS | Status: DC | PRN

## 2023-03-27 MED ORDER — LIDOCAINE HCL (PF) 1 % IJ SOLN: 5.0000 mL | INTRAMUSCULAR | Status: DC | PRN

## 2023-03-27 MED ORDER — LIDOCAINE-PRILOCAINE 2.5-2.5 % EX CREA: 1.0000 | TOPICAL_CREAM | CUTANEOUS | Status: DC | PRN

## 2023-03-27 MED ORDER — ALTEPLASE 2 MG IJ SOLR: 2.0000 mg | Freq: Once | INTRAMUSCULAR | Status: DC | PRN

## 2023-03-27 MED ORDER — HEPARIN SODIUM (PORCINE) 1000 UNIT/ML IJ SOLN
INTRAMUSCULAR | Status: AC
  Filled 2023-03-27: qty 4

## 2023-03-27 MED ORDER — HEPARIN SODIUM (PORCINE) 1000 UNIT/ML DIALYSIS: 1000.0000 [IU] | INTRAMUSCULAR | Status: DC | PRN

## 2023-03-27 NOTE — Progress Notes (Signed)
Pharmacy Antibiotic Note  [name] [name] is a 62 y.o. male admitted on 03/06/2023 with MRSA and E. 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  Pharmacy has been consulted for vancomycin dosing.  Plans for HD today. Vanc random level, prior to HD: 23, with goal of 25. Patient to remain on current dosing. Patient noted to be agitated with HD 10/12   Plan: Continue Vancomycin 750 mg IV qHD (MWF schedule) Monitor levels as indicated F/U HD tolerability  OPAT entered 10/8 (To complete Vanco with HD 11/19)   Height: 5\' 10"  (177.8 cm) Weight: 60.6 kg (133 lb 9.6 oz) IBW/kg (Calculated) : 73  Temp (24hrs), Avg:97.9 F (36.6 C), Min:97.7 F (36.5 C), Max:98 F (36.7 C)  Recent Labs  Lab 03/22/23 1058 03/23/23 0917 03/24/23 0447 03/25/23 1302 03/26/23 0851 03/27/23 0704  WBC 6.3 7.1  --  3.9* 4.7 4.0  CREATININE 4.85* 5.67* 6.30* 7.56* 4.57* 5.75*  VANCORANDOM  --   --   --   --   --  23    Estimated Creatinine Clearance: 11.1 mL/min (A) (by C-G formula based on SCr of 5.75 mg/dL (H)).    No Known Allergies  Antimicrobials this admission: Ceftriaxone 9/23 x 1 Vancomycin 9/24 >> (11/19)   Microbiology results: 9/23 COVID: positive 9/23 blood: 2/2 E.faecalis, MRSA 9/24 blood: 4/4 MRSA 9/23: MRSA PCR: positive 9/26 blood: negative 10/2 HD graft: negative 10/8 blood: negF   Thank you for allowing pharmacy to be a part of this patient's care.  Jani Gravel, PharmD Clinical Pharmacist  03/27/2023 8:43 AM

## 2023-03-27 NOTE — Progress Notes (Signed)
Subjective:  Patient with some bleeding from new HD catheter site  Antibiotics:  Anti-infectives (From admission, onward)    Start     Dose/Rate Route Frequency Ordered Stop   03/16/23 1315  vancomycin (VANCOREADY) IVPB 750 mg/150 mL        750 mg 150 mL/hr over 60 Minutes Intravenous  Once 03/16/23 1226 03/16/23 1504   03/14/23 1030  vancomycin (VANCOREADY) IVPB 750 mg/150 mL        750 mg 150 mL/hr over 60 Minutes Intravenous  Once 03/14/23 0909 03/14/23 1202   03/10/23 1600  vancomycin (VANCOREADY) IVPB 750 mg/150 mL  Status:  Discontinued        750 mg 150 mL/hr over 60 Minutes Intravenous Every M-W-F (Hemodialysis) 03/08/23 1106 03/09/23 0850   03/10/23 1600  vancomycin (VANCOREADY) IVPB 750 mg/150 mL        750 mg 150 mL/hr over 60 Minutes Intravenous Every M-W-F (Hemodialysis) 03/09/23 1037     03/09/23 1600  vancomycin (VANCOREADY) IVPB 750 mg/150 mL        750 mg 150 mL/hr over 60 Minutes Intravenous Every T-Th-Sa (Hemodialysis) 03/08/23 1106 03/10/23 1315   03/08/23 1200  vancomycin (VANCOREADY) IVPB 750 mg/150 mL  Status:  Discontinued        750 mg 150 mL/hr over 60 Minutes Intravenous Every M-W-F (Hemodialysis) 03/07/23 1051 03/08/23 1106   03/07/23 1145  vancomycin (VANCOREADY) IVPB 1750 mg/350 mL        1,750 mg 175 mL/hr over 120 Minutes Intravenous  Once 03/07/23 1051 03/07/23 1617   03/07/23 1000  remdesivir 100 mg in sodium chloride 0.9 % 100 mL IVPB  Status:  Discontinued       Placed in "Followed by" Linked Group   100 mg 200 mL/hr over 30 Minutes Intravenous Daily 03/06/23 1638 03/06/23 1645   03/07/23 1000  remdesivir 100 mg in sodium chloride 0.9 % 100 mL IVPB       Placed in "Followed by" Linked Group   100 mg 200 mL/hr over 30 Minutes Intravenous Daily 03/06/23 1645 03/09/23 1628   03/06/23 1730  remdesivir 100 mg in sodium chloride 0.9 % 100 mL IVPB       Placed in "Followed by" Linked Group   100 mg 200 mL/hr over 30 Minutes  Intravenous Every 30 min 03/06/23 1645 03/06/23 1909   03/06/23 1638  remdesivir 200 mg in sodium chloride 0.9% 250 mL IVPB  Status:  Discontinued       Placed in "Followed by" Linked Group   200 mg 580 mL/hr over 30 Minutes Intravenous Once 03/06/23 1638 03/06/23 1645   03/06/23 1500  cefTRIAXone (ROCEPHIN) 1 g in sodium chloride 0.9 % 100 mL IVPB  Status:  Discontinued        1 g 200 mL/hr over 30 Minutes Intravenous Every 24 hours 03/06/23 1440 03/07/23 1429       Medications: Scheduled Meds:  amLODipine  10 mg Oral q morning   atorvastatin  20 mg Oral q morning   Chlorhexidine Gluconate Cloth  6 each Topical Q0600   darbepoetin (ARANESP) injection - DIALYSIS  60 mcg Subcutaneous Q Thu-1800   feeding supplement (NEPRO CARB STEADY)  237 mL Oral TID BM   heparin injection (subcutaneous)  5,000 Units Subcutaneous Q8H   heparin sodium (porcine)       hydrALAZINE  10 mg Oral Q8H   insulin aspart  0-9 Units Subcutaneous TID WC   levothyroxine  25 mcg Oral QAC breakfast   liver oil-zinc oxide   Topical Daily   metoprolol tartrate  50 mg Oral BID   multivitamin with minerals  1 tablet Oral Daily   pantoprazole  40 mg Oral Daily   polyethylene glycol  17 g Oral Daily   sodium chloride flush  10-40 mL Intracatheter Q12H   Continuous Infusions:  vancomycin 750 mg (03/27/23 1134)   PRN Meds:.acetaminophen **OR** acetaminophen, haloperidol lactate, heparin sodium (porcine), HYDROmorphone (DILAUDID) injection, ondansetron **OR** ondansetron (ZOFRAN) IV, mouth rinse, oxyCODONE-acetaminophen, sodium chloride flush    Objective: Weight change: -0.3 kg  Intake/Output Summary (Last 24 hours) at 03/27/2023 1418 Last data filed at 03/27/2023 1243 Gross per 24 hour  Intake --  Output 800 ml  Net -800 ml   Blood pressure 138/81, pulse 81, temperature 97.9 F (36.6 C), resp. rate 18, height 5\' 10"  (1.778 m), weight 61.6 kg, SpO2 100%. Temp:  [97.7 F (36.5 C)-98 F (36.7 C)] 97.9 F  (36.6 C) (10/14 1400) Pulse Rate:  [60-107] 81 (10/14 1400) Resp:  [8-20] 18 (10/14 1400) BP: (84-149)/(62-93) 138/81 (10/14 1400) SpO2:  [96 %-100 %] 100 % (10/14 1400) Weight:  [60.6 kg-61.6 kg] 61.6 kg (10/14 0924)  Physical Exam: Physical Exam Constitutional:      Appearance: He is well-developed.  HENT:     Head: Normocephalic and atraumatic.  Eyes:     Conjunctiva/sclera: Conjunctivae normal.  Cardiovascular:     Rate and Rhythm: Normal rate and regular rhythm.  Pulmonary:     Effort: Pulmonary effort is normal. No respiratory distress.     Breath sounds: No wheezing.  Abdominal:     General: There is no distension.     Palpations: Abdomen is soft.  Musculoskeletal:        General: Normal range of motion.     Cervical back: Normal range of motion and neck supple.  Skin:    General: Skin is warm and dry.     Findings: No erythema or rash.  Neurological:     General: No focal deficit present.     Mental Status: He is alert.  Psychiatric:        Attention and Perception: He is inattentive.        Mood and Affect: Mood normal.        Speech: Speech is delayed.        Thought Content: Thought content normal.        Cognition and Memory: Memory is impaired.        Judgment: Judgment normal.      CBC:    BMET Recent Labs    03/26/23 0851 03/27/23 0704  NA 136 136  K 3.8 3.8  CL 101 100  CO2 22 23  GLUCOSE 78 126*  BUN 24* 35*  CREATININE 4.57* 5.75*  CALCIUM 8.2* 8.0*     Liver Panel  Recent Labs    03/27/23 0704  ALBUMIN 2.0*       Sedimentation Rate No results for input(s): "ESRSEDRATE" in the last 72 hours. C-Reactive Protein No results for input(s): "CRP" in the last 72 hours.  Micro Results: Recent Results (from the past 720 hour(s))  SARS Coronavirus 2 by RT PCR (hospital order, performed in [facility] hospital lab) *cepheid single result test* Anterior Nasal Swab     Status: Abnormal   Collection Time: 03/06/23 10:36 AM    Specimen: Anterior Nasal Swab  Result Value Ref Range Status   SARS Coronavirus 2  by RT PCR POSITIVE (A) NEGATIVE Final    Comment: (NOTE) SARS-CoV-2 target nucleic acids are DETECTED  SARS-CoV-2 RNA is generally detectable in upper respiratory specimens  during the acute phase of infection.  Positive results are indicative  of the presence of the identified virus, but do not rule out bacterial infection or co-infection with other pathogens not detected by the test.  Clinical correlation with patient history and  other diagnostic information is necessary to determine patient infection status.  The expected result is negative.  Fact Sheet for Patients:   RoadLapTop.co.za   Fact Sheet for Healthcare Providers:   http://kim-miller.com/    This test is not yet approved or cleared by the [place] FDA and  has been authorized for detection and/or diagnosis of SARS-CoV-2 by FDA under an Emergency Use Authorization (EUA).  This EUA will remain in effect (meaning this test can be used) for the duration of  the COVID-19 declaration under Section 564(b)(1)  of the Act, 21 U.S.C. section 360-bbb-3(b)(1), unless the authorization is terminated or revoked sooner.   Performed at [facility], [address] [place], [place] [address]   Culture, blood (routine x 2)     Status: Abnormal   Collection Time: 03/06/23 11:35 AM   Specimen: BLOOD  Result Value Ref Range Status   Specimen Description   Final    BLOOD BLOOD RIGHT ARM Performed at [facility], [address], [place], [place] [address]    Special Requests   Final    BOTTLES DRAWN AEROBIC ONLY Blood Culture results may not be optimal due to an inadequate volume of blood received in culture bottles Performed at [facility], [address], [place], [place] [address]    Culture  Setup Time   Final    GRAM POSITIVE COCCI AEROBIC BOTTLE ONLY Gram Stain Report Called to,Read Back By and  Verified With: DILDY,V. ON 03/07/2023 AT 1140 BY FRATTO,A. Performed at [facility], [address], [place], [place] [address]    Culture (A)  Final    STAPHYLOCOCCUS AUREUS SUSCEPTIBILITIES PERFORMED ON PREVIOUS CULTURE WITHIN THE LAST 5 DAYS. STAPHYLOCOCCUS CAPITIS THE SIGNIFICANCE OF ISOLATING THIS ORGANISM FROM A SINGLE SET OF BLOOD CULTURES WHEN MULTIPLE SETS ARE DRAWN IS UNCERTAIN. PLEASE NOTIFY THE MICROBIOLOGY DEPARTMENT WITHIN ONE WEEK IF SPECIATION AND SENSITIVITIES ARE REQUIRED. Performed at [facility], [address], [place], [place] [address]    Report Status 03/10/2023 FINAL  Final  Culture, blood (routine x 2)     Status: Abnormal   Collection Time: 03/06/23 11:35 AM   Specimen: BLOOD  Result Value Ref Range Status   Specimen Description   Final    BLOOD BLOOD LEFT HAND Performed at [facility], [address] [place], [place] [address]    Special Requests   Final    BOTTLES DRAWN AEROBIC ONLY Blood Culture adequate volume Performed at [facility], [address], [place], [place] [address]    Culture  Setup Time   Final    GRAM POSITIVE COCCI AEROBIC BOTTLE ONLY CRITICAL RESULT CALLED TO, READ BACK BY AND VERIFIED WITH: [facility] WEAVER @ 1884 ON 03/07/23 C VARNER CRITICAL RESULT CALLED TO, READ BACK BY AND VERIFIED WITH: Dorann Lodge MENDENHALL [id number] AT 1019 BY CM Performed at [facility], [address], [place], [place] [address]    Culture (A)  Final    METHICILLIN RESISTANT STAPHYLOCOCCUS AUREUS ENTEROCOCCUS FAECALIS    Report Status 03/10/2023 FINAL  Final  Organism ID, Bacteria METHICILLIN RESISTANT STAPHYLOCOCCUS AUREUS  Final   Organism ID, Bacteria ENTEROCOCCUS FAECALIS  Final      Susceptibility   Enterococcus faecalis - MIC*    AMPICILLIN <=2 SENSITIVE Sensitive     VANCOMYCIN 1 SENSITIVE Sensitive     GENTAMICIN SYNERGY SENSITIVE Sensitive     * ENTEROCOCCUS FAECALIS   Methicillin resistant staphylococcus aureus - MIC*     CIPROFLOXACIN >=8 RESISTANT Resistant     ERYTHROMYCIN >=8 RESISTANT Resistant     GENTAMICIN <=0.5 SENSITIVE Sensitive     OXACILLIN >=4 RESISTANT Resistant     TETRACYCLINE <=1 SENSITIVE Sensitive     VANCOMYCIN 1 SENSITIVE Sensitive     TRIMETH/SULFA >=320 RESISTANT Resistant     CLINDAMYCIN <=0.25 SENSITIVE Sensitive     RIFAMPIN <=0.5 SENSITIVE Sensitive     Inducible Clindamycin NEGATIVE Sensitive     LINEZOLID 2 SENSITIVE Sensitive     * METHICILLIN RESISTANT STAPHYLOCOCCUS AUREUS  Blood Culture ID Panel (Reflexed)     Status: Abnormal   Collection Time: 03/06/23 11:35 AM  Result Value Ref Range Status   Enterococcus faecalis DETECTED (A) NOT DETECTED Final    Comment: CRITICAL RESULT CALLED TO, READ BACK BY AND VERIFIED WITH: PHARMD J MENEDENHALL [id number] AT 1019 BY CM    Enterococcus Faecium NOT DETECTED NOT DETECTED Final   Listeria monocytogenes NOT DETECTED NOT DETECTED Final   Staphylococcus species DETECTED (A) NOT DETECTED Final    Comment: CRITICAL RESULT CALLED TO, READ BACK BY AND VERIFIED WITH: PHARMD J MENDENHALL [id number] AT 1019 BY CM    Staphylococcus aureus (BCID) DETECTED (A) NOT DETECTED Final    Comment: Methicillin (oxacillin)-resistant Staphylococcus aureus (MRSA). MRSA is predictably resistant to beta-lactam antibiotics (except ceftaroline). Preferred therapy is vancomycin unless clinically contraindicated. Patient requires contact precautions if  hospitalized. CRITICAL RESULT CALLED TO, READ BACK BY AND VERIFIED WITH: PHARMD J MENEDENHALL [id number] AT 1019 BY CM    Staphylococcus epidermidis NOT DETECTED NOT DETECTED Final   Staphylococcus lugdunensis NOT DETECTED NOT DETECTED Final   Streptococcus species NOT DETECTED NOT DETECTED Final   Streptococcus agalactiae NOT DETECTED NOT DETECTED Final   Streptococcus pneumoniae NOT DETECTED NOT DETECTED Final   Streptococcus pyogenes NOT DETECTED NOT DETECTED Final   A.calcoaceticus-baumannii NOT DETECTED NOT  DETECTED Final   Bacteroides fragilis NOT DETECTED NOT DETECTED Final   Enterobacterales NOT DETECTED NOT DETECTED Final   Enterobacter cloacae complex NOT DETECTED NOT DETECTED Final   Escherichia coli NOT DETECTED NOT DETECTED Final   Klebsiella aerogenes NOT DETECTED NOT DETECTED Final   Klebsiella oxytoca NOT DETECTED NOT DETECTED Final   Klebsiella pneumoniae NOT DETECTED NOT DETECTED Final   Proteus species NOT DETECTED NOT DETECTED Final   Salmonella species NOT DETECTED NOT DETECTED Final   Serratia marcescens NOT DETECTED NOT DETECTED Final   Haemophilus influenzae NOT DETECTED NOT DETECTED Final   Neisseria meningitidis NOT DETECTED NOT DETECTED Final   Pseudomonas aeruginosa NOT DETECTED NOT DETECTED Final   Stenotrophomonas maltophilia NOT DETECTED NOT DETECTED Final   Candida albicans NOT DETECTED NOT DETECTED Final   Candida auris NOT DETECTED NOT DETECTED Final   Candida glabrata NOT DETECTED NOT DETECTED Final   Candida krusei NOT DETECTED NOT DETECTED Final   Candida parapsilosis NOT DETECTED NOT DETECTED Final   Candida tropicalis NOT DETECTED NOT DETECTED Final   Cryptococcus neoformans/gattii NOT DETECTED NOT DETECTED Final   Meth resistant mecA/C and MREJ DETECTED (A) NOT DETECTED Final  Comment: CRITICAL RESULT CALLED TO, READ BACK BY AND VERIFIED WITH: PHARMD J MENEDENHALL [id number] AT 1019 BY CM    Vancomycin resistance NOT DETECTED NOT DETECTED Final    Comment: Performed at [facility], [address], [place], [place] [address]  MRSA Next Gen by PCR, Nasal     Status: Abnormal   Collection Time: 03/06/23  6:44 PM   Specimen: Nasal Mucosa; Nasal Swab  Result Value Ref Range Status   MRSA by PCR Next Gen DETECTED (A) NOT DETECTED Final    Comment: RESULT CALLED TO, READ BACK BY AND VERIFIED WITH: [facility] BEAVER 2104 03/06/23 BY VIRAY,J (NOTE) The GeneXpert MRSA Assay (FDA approved for NASAL specimens only), is one component of a comprehensive MRSA  colonization surveillance program. It is not intended to diagnose MRSA infection nor to guide or monitor treatment for MRSA infections. Test performance is not FDA approved in patients less than 67 years old. Performed at [facility], [address] [place], [place] [address]   Culture, blood (Routine X 2) w Reflex to ID Panel     Status: Abnormal   Collection Time: 03/07/23 12:08 PM   Specimen: BLOOD  Result Value Ref Range Status   Specimen Description   Final    BLOOD ARTERIAL DRAW Performed at [facility], [address], [place], [place] [address]    Special Requests   Final    BOTTLES DRAWN AEROBIC AND ANAEROBIC Blood Culture results may not be optimal due to an excessive volume of blood received in culture bottles Performed at [facility], [address] [place], [place] [address]    Culture  Setup Time   Final    GRAM POSITIVE COCCI IN BOTH AEROBIC AND ANAEROBIC BOTTLES CRITICAL RESULT CALLED TO, READ BACK BY AND VERIFIED WITH: K.THOMAS ON 03/08/2023 @14 :54 BY FRATTO,A  CRITICAL VALUE NOTED.  VALUE IS CONSISTENT WITH PREVIOUSLY REPORTED AND CALLED VALUE.    Culture (A)  Final    STAPHYLOCOCCUS AUREUS SUSCEPTIBILITIES PERFORMED ON PREVIOUS CULTURE WITHIN THE LAST 5 DAYS. Performed at [facility], [address], [place], [place] [address]    Report Status 03/10/2023 FINAL  Final  Culture, blood (Routine X 2) w Reflex to ID Panel     Status: Abnormal   Collection Time: 03/07/23 12:24 PM   Specimen: BLOOD  Result Value Ref Range Status   Specimen Description   Final    BLOOD ARTERIAL DRAW Performed at [facility], [address], [place], [place] [address]    Special Requests   Final    BOTTLES DRAWN AEROBIC AND ANAEROBIC Blood Culture results may not be optimal due to an excessive volume of blood received in culture bottles Performed at [facility], [address], [place], [place] [address]    Culture  Setup Time   Final    GRAM POSITIVE COCCI IN BOTH  AEROBIC AND ANAEROBIC BOTTLES CRITICAL RESULT CALLED TO, READ BACK BY AND VERIFIED WITH: B.DILDI ON 03/08/2023 @12 :27 BY T.HAMER CRITICAL VALUE NOTED.  VALUE IS CONSISTENT WITH PREVIOUSLY REPORTED AND CALLED VALUE.    Culture (A)  Final    STAPHYLOCOCCUS AUREUS SUSCEPTIBILITIES PERFORMED ON PREVIOUS CULTURE WITHIN THE LAST 5 DAYS. Performed at [facility], [address], [place], [place] [address]    Report Status 03/10/2023 FINAL  Final  Culture, blood (Routine X 2) w Reflex to ID Panel     Status: None   Collection Time: 03/09/23  3:54 PM  Specimen: BLOOD RIGHT ARM  Result Value Ref Range Status   Specimen Description BLOOD RIGHT ARM  Final   Special Requests   Final    BOTTLES DRAWN AEROBIC ONLY Blood Culture results may not be optimal due to an excessive volume of blood received in culture bottles   Culture   Final    NO GROWTH 5 DAYS Performed at [facility], [address], [place], [place] [address]    Report Status 03/14/2023 FINAL  Final  Culture, blood (Routine X 2) w Reflex to ID Panel     Status: None   Collection Time: 03/09/23  3:54 PM   Specimen: BLOOD RIGHT ARM  Result Value Ref Range Status   Specimen Description BLOOD RIGHT ARM  Final   Special Requests   Final    BOTTLES DRAWN AEROBIC AND ANAEROBIC Blood Culture adequate volume   Culture   Final    NO GROWTH 5 DAYS Performed at [facility], [address], [place], [place] [address]    Report Status 03/14/2023 FINAL  Final  Aerobic/Anaerobic Culture w Gram Stain (surgical/deep wound)     Status: None   Collection Time: 03/15/23  4:02 PM   Specimen: Hemodialysis Graft; Blood  Result Value Ref Range Status   Specimen Description HEMODIALYSIS GRAFT LEFT ARM  Final   Special Requests NONE  Final   Gram Stain   Final    RARE WBC PRESENT, PREDOMINANTLY PMN NO ORGANISMS SEEN    Culture   Final    No growth aerobically or anaerobically. Performed at [facility], [address], [place], [place] [address]    Report Status 03/20/2023 FINAL  Final  Culture, blood (Routine X 2) w Reflex to ID Panel     Status: None   Collection Time: 03/21/23  2:03 PM   Specimen: BLOOD RIGHT HAND  Result Value Ref Range Status   Specimen Description BLOOD RIGHT HAND  Final   Special Requests   Final    BOTTLES DRAWN AEROBIC AND ANAEROBIC Blood Culture adequate volume   Culture   Final    NO GROWTH 5 DAYS Performed at [facility], [address] [place], [place] [address]    Report Status 03/26/2023 FINAL  Final  Culture, blood (Routine X 2) w Reflex to ID Panel     Status: None   Collection Time: 03/21/23  2:06 PM   Specimen: BLOOD RIGHT ARM  Result Value Ref Range Status   Specimen Description BLOOD RIGHT ARM  Final   Special Requests   Final    BOTTLES DRAWN AEROBIC AND ANAEROBIC Blood Culture adequate volume   Culture   Final    NO GROWTH 5 DAYS Performed at [facility], [address] [place], [place] [address]    Report Status 03/26/2023 FINAL  Final    Studies/Results: No results found.    Assessment/Plan:  INTERVAL HISTORY: sp HD catheter removal   Principal Problem:   Endocarditis of mitral valve Active Problems:   Essential hypertension   Neuropathy   Anemia of chronic disease   Hypokalemia   Hypothyroidism   GERD (gastroesophageal reflux disease)   ESRD (end stage renal disease) (HCC)   Acute metabolic encephalopathy   Protein-calorie malnutrition, severe   MRSA bacteremia   Enterococcal bacteremia   Hemodialysis catheter infection (HCC)   Delirium    [name] [name] is a 62 y.o. male with admitted with MRSA in 2/2 blood  cultures on 03/06/23, and E faecalis in 1/2 same blood cultures, then MRSA 2/2 but no E faecalis on 2nd set prior to effective antibiotics then found to have infection status post removal of AV graft last week and placement of temporary HD catheter.  Yesterday when plans were made to place new HD catheter TEE was  performed which showed endocarditis involving the mitral valve with a small linear density on the lateral commissure and mild thickening of the anterior and posterior mitral valve as well as a quite large 6.4 x 2.8 mm density attached to the HD catheter tip.  His HD catheter temporary has been removed this morning.  Plans are for HD catheter holiday prior to placement of a permanent HD catheter.  Blood cultures been taken after HD catheter was removed today.  We will plan on using vancomycin alone for 6 weeks with tomorrow being day 1  Note as far as the enterococcus is concerned I feel we have for sufficient amoutn of data to put the weight of our concern on the MRSA as the primary if not exclusive intravscular pathogen.  The Enterococcus is certainly covered by vancomycin but if we truly thought that the patient had primarily an enterococcal endocarditis we would typically treat with dual beta-lactam therapy.  I do not think that is necessary in this case.  Diagnosis: MRSA and E. Faecalis bacteremia and possible AV endocarditis . AVGraft infection Culture Result: MRSA and E. Faecalis   No Known Allergies  OPAT Orders Discharge antibiotics to be given via PICC line Discharge antibiotics: Vancomycin 750 mg IV qHD (MWF schedule) Monitor levels as indicated Duration: 6 weeks End Date: 05/02/2023 HD session PIC in place until doctor has seen patient or been notified  Fax weekly labs to [phone]  Clinic Follow Up Appt:   Verne Lanuza has an appointment on 04/18/2023 at 115PM with Dr. Daiva Eves at  [facility] for [facility], which  is located in the [facility] at  [address] in [place].  [address], which is located to the left of the elevators.  Phone: [phone]  Fax: [phone]  [email or web address]  The patient should arrive 30 minutes prior to their appoitment.    I have personally spent 51  minutes involved in face-to-face and non-face-to-face activities for this patient on the day of the visit. Professional time spent includes the following activities: Preparing to see the patient (review of tests), Obtaining and/or reviewing separately obtained history (admission/discharge record), Performing a medically appropriate examination and/or evaluation , Ordering medications/tests/procedures, referring and communicating with other health care professionals, Documenting clinical information in the EMR, Independently interpreting results (not separately reported), Communicating results to the patient/family/caregiver, Counseling and educating the patient/family/caregiver and Care coordination (not separately reported).   I will sign off for now please call with further questions.    LOS: 21 days   Acey Lav 03/27/2023, 2:18 PM

## 2023-03-27 NOTE — Progress Notes (Signed)
Called to get nursing report for dialysis, was transferred twice to the assigned nurse phone no answer. Secretary Shaquilla agree to get them to call back due to them being in nursing report.

## 2023-03-27 NOTE — Progress Notes (Signed)
PROGRESS NOTE    [name] [name]  GLO:[id number] DOB: 1960-11-04 DOA: 03/06/2023 PCP: Farris Has, MD   Brief Narrative: 62 year old with past medical history significant for ESRD on hemodialysis MWF, hypertension, diabetes type 2, pulmonary hypertension, cardiomyopathy, GERD, dyslipidemia, anemia of CKD presents to the ED with worsening mental status.  He was admitted with acute metabolic encephalopathy secondary to UTI and COVID-pneumonia and also missed hemodialysis sections.  He was found to have MRSA and enterococcal bacteremia. He was started on Vancomycin.  ID has been following.  Patient was transferred to [facility] for graft removal     Assessment & Plan:   Principal Problem:   Endocarditis of mitral valve Active Problems:   Essential hypertension   Neuropathy   Anemia of chronic disease   Hypokalemia   Hypothyroidism   GERD (gastroesophageal reflux disease)   ESRD (end stage renal disease) (HCC)   Acute metabolic encephalopathy   Protein-calorie malnutrition, severe   MRSA bacteremia   Enterococcal bacteremia   Hemodialysis catheter infection (HCC)   Delirium    MRSA and Enterococcus bacteremia, Mitral Valve Endocarditis, vegetation on [facility] Currently afebrile with no leukocytosis Concern for infection in the AV graft s/p excision of graft 10/2 TEE 10/07: Small linear 2.3 mm mobile vegetation attached to the lateral commissure of the P1 segment of the mitral valve consistent with vegetation.  Dialysis catheter tip in the right atrium with 6.4 mm x 2.8 mm mobile mass consistent with vegetation ID consulted, s/p holiday line after removal on 10/8, replaced TDC on 10/12 by vascular after BC remained NGTD Continue IV vancomycin for 6 weeks (10/8-11/19)  COVID infection Tested positive on 9/23 Completed course of Remdesivir, steroid Off isolation since 10/03  ESRD on hemodialysis MWF Managed per nephrology Underwent excision of AV fistula due to infection Temporary  HD catheter with vegetation, ID consulted, s/p holiday line after removal on 10/8, replaced TDC on 10/12 by vascular after BC remained NGTD  Acute metabolic encephalopathy Improved In the setting of UTI/COVID, missed hemodialysis and bacteremia Likely has some cognitive impairment at baseline  Anemia of chronic kidney disease Stable  Hypertension Continue with metoprolol and amlodipine, added hydralazine (refusing meds)  Diabetes type 2 Continue with sliding scale insulin  Hypothyroidism Continue with Synthroid  GERD Continue with PPI  Thrombocytopenia Improving  Hypoalbuminemia On supplements       Nutrition Problem: Severe Malnutrition Etiology: chronic illness    Signs/Symptoms: severe muscle depletion, severe fat depletion    Interventions: Nepro shake  Estimated body mass index is 19.49 kg/m as calculated from the following:   Height as of this encounter: 5\' 10"  (1.778 m).   Weight as of this encounter: 61.6 kg.   DVT prophylaxis: Heparin [place] Code Status: Full code Family Communication: None at bedside Disposition Plan:  Status is: Inpatient Remains inpatient appropriate because: Management of bacteremia.     Consultants:  Nephrology ID Vascular  Procedures:  Graft, TDC removal and re-insertion  Antimicrobials:  Vancomycin  Subjective: Pt has a poor mood.. Wants to go home. Was noted to be pulling out lines/agitated after HD on 10/12. Remains stable and cooperative on the floor. Intermittently refuses meds and lab draw     Objective: Vitals:   03/27/23 1225 03/27/23 1227 03/27/23 1230 03/27/23 1243  BP: (!) 84/62 92/66 98/73  132/74  Pulse: 89 85 94 84  Resp: 15 14 16 13   Temp:      TempSrc:      SpO2: 96% 100% 100% 100%  Weight:      Height:        Intake/Output Summary (Last 24 hours) at 03/27/2023 1336 Last data filed at 03/27/2023 1243 Gross per 24 hour  Intake --  Output 800 ml  Net -800 ml   Filed Weights    03/25/23 1604 03/26/23 1957 03/27/23 0924  Weight: 57.3 kg 60.6 kg 61.6 kg    Examination: General: NAD, chronically ill appearing  Cardiovascular: S1, S2 present Respiratory: CTAB Abdomen: Soft, nontender, nondistended, bowel sounds present Musculoskeletal: No bilateral pedal edema noted Skin: Normal Psychiatry: fair mood     Data Reviewed: I have personally reviewed following labs and imaging studies  CBC: Recent Labs  Lab 03/22/23 1058 03/23/23 0917 03/25/23 1302 03/26/23 0851 03/27/23 0704  WBC 6.3 7.1 3.9* 4.7 4.0  NEUTROABS 4.7 4.9 2.4 2.6 2.2  HGB 10.0* 10.5* 9.3* 10.1* 8.9*  HCT 31.7* 32.7* 29.6* 32.0* 28.5*  MCV 88.5 91.1 90.8 91.7 89.9  PLT 137* 167 141* 128* 118*   Basic Metabolic Panel: Recent Labs  Lab 03/23/23 0917 03/24/23 0447 03/25/23 1302 03/26/23 0851 03/27/23 0704  NA 139 139 143 136 136  K 3.9 3.4* 4.0 3.8 3.8  CL 101 100 103 101 100  CO2 23 25 24 22 23   GLUCOSE 128* 112* 136* 78 126*  BUN 32* 38* 44* 24* 35*  CREATININE 5.67* 6.30* 7.56* 4.57* 5.75*  CALCIUM 8.6* 8.2* 8.5* 8.2* 8.0*  PHOS  --   --  3.5  --  3.1   GFR: Estimated Creatinine Clearance: 11.3 mL/min (A) (by C-G formula based on SCr of 5.75 mg/dL (H)). Liver Function Tests: Recent Labs  Lab 03/27/23 0704  ALBUMIN 2.0*    No results for input(s): "LIPASE", "AMYLASE" in the last 168 hours. No results for input(s): "AMMONIA" in the last 168 hours. Coagulation Profile: No results for input(s): "INR", "PROTIME" in the last 168 hours. Cardiac Enzymes: No results for input(s): "CKTOTAL", "CKMB", "CKMBINDEX", "TROPONINI" in the last 168 hours. BNP (last 3 results) No results for input(s): "PROBNP" in the last 8760 hours. HbA1C: No results for input(s): "HGBA1C" in the last 72 hours. CBG: Recent Labs  Lab 03/25/23 2107 03/26/23 0723 03/26/23 1108 03/26/23 1618 03/27/23 0753  GLUCAP 96 75 100* 137* 118*   Lipid Profile: No results for input(s): "CHOL", "HDL",  "LDLCALC", "TRIG", "CHOLHDL", "LDLDIRECT" in the last 72 hours. Thyroid Function Tests: No results for input(s): "TSH", "T4TOTAL", "FREET4", "T3FREE", "THYROIDAB" in the last 72 hours. Anemia Panel: No results for input(s): "VITAMINB12", "FOLATE", "FERRITIN", "TIBC", "IRON", "RETICCTPCT" in the last 72 hours. Sepsis Labs: No results for input(s): "PROCALCITON", "LATICACIDVEN" in the last 168 hours.  Recent Results (from the past 240 hour(s))  Culture, blood (Routine X 2) w Reflex to ID Panel     Status: None   Collection Time: 03/21/23  2:03 PM   Specimen: BLOOD RIGHT HAND  Result Value Ref Range Status   Specimen Description BLOOD RIGHT HAND  Final   Special Requests   Final    BOTTLES DRAWN AEROBIC AND ANAEROBIC Blood Culture adequate volume   Culture   Final    NO GROWTH 5 DAYS Performed at [facility], [address], [place], [place] [address]    Report Status 03/26/2023 FINAL  Final  Culture, blood (Routine X 2) w Reflex to ID Panel     Status: None   Collection Time: 03/21/23  2:06 PM   Specimen: BLOOD RIGHT ARM  Result Value Ref Range  Status   Specimen Description BLOOD RIGHT ARM  Final   Special Requests   Final    BOTTLES DRAWN AEROBIC AND ANAEROBIC Blood Culture adequate volume   Culture   Final    NO GROWTH 5 DAYS Performed at [facility], [address], [place], [place] [address]    Report Status 03/26/2023 FINAL  Final         Radiology Studies: No results found.      Scheduled Meds:  amLODipine  10 mg Oral q morning   atorvastatin  20 mg Oral q morning   Chlorhexidine Gluconate Cloth  6 each Topical Q0600   darbepoetin (ARANESP) injection - DIALYSIS  60 mcg Subcutaneous Q Thu-1800   feeding supplement (NEPRO CARB STEADY)  237 mL Oral TID BM   heparin injection (subcutaneous)  5,000 Units Subcutaneous Q8H   heparin sodium (porcine)       hydrALAZINE  10 mg Oral Q8H   insulin aspart  0-9 Units Subcutaneous TID WC   levothyroxine  25  mcg Oral QAC breakfast   liver oil-zinc oxide   Topical Daily   metoprolol tartrate  50 mg Oral BID   multivitamin with minerals  1 tablet Oral Daily   pantoprazole  40 mg Oral Daily   polyethylene glycol  17 g Oral Daily   sodium chloride flush  10-40 mL Intracatheter Q12H   Continuous Infusions:  vancomycin 750 mg (03/27/23 1134)     LOS: 21 days      Briant Cedar, MD [facility]   If 7PM-7AM, please contact night-coverage [email or web address]  03/27/2023, 1:36 PM

## 2023-03-27 NOTE — TOC Progression Note (Signed)
Transition of Care [facility]) - Progression Note    Patient Details  Name: [name] [name] MRN: [id number] Date of Birth: [birth year]  Transition of Care [facility]) CM/SW Contact  Erin Sons, [place] Phone Number: 03/27/2023, 3:06 PM  Clinical Narrative:     CSW spoke with attending who confirmed pt was medically stable for DC to SNF. CSW contacted [facility] who confirmed pt can admit once insurance [employer] is approved. TOC to initiated SNF auth requests once updated therapy note is in. PT notified.   CSW called and updated pt's mother as well.   Expected Discharge Plan: Skilled Nursing Facility Barriers to Discharge: Insurance Authorization  Expected Discharge Plan and Services In-house Referral: Clinical Social Work     Living arrangements for the past 2 months: Single Family Home                                       Social Determinants of Health (SDOH) Interventions SDOH Screenings   Food Insecurity: No Food Insecurity (03/06/2023)  Housing: Low Risk  (03/06/2023)  Transportation Needs: No Transportation Needs (03/06/2023)  Utilities: Not At Risk (03/06/2023)  Depression (PHQ2-9): Low Risk  (09/18/2018)  Tobacco Use: Low Risk  (03/25/2023)    Readmission Risk Interventions    03/07/2023    1:31 PM  Readmission Risk Prevention Plan  Transportation Screening Complete  HRI or Home Care Consult Complete  Palliative Care Screening Not Applicable  Medication Review (RN Care Manager) Complete

## 2023-03-27 NOTE — Progress Notes (Signed)
  Progress Note    03/27/2023 1:28 PM 2 Days Post-Op  Subjective:  seen on HD.  Slow bleed from [facility] exit site.   Vitals:   03/27/23 1230 03/27/23 1243  BP: 98/73 132/74  Pulse: 94 84  Resp: 16 13  Temp:    SpO2: 100% 100%   Physical Exam: Lungs: non labored Incisions:  L arm c/d/I; TDC exit site with slow sanguinous ooz Neurologic: A&O  CBC    Component Value Date/Time   WBC 4.0 03/27/2023 0704   RBC 3.17 (L) 03/27/2023 0704   HGB 8.9 (L) 03/27/2023 0704   HCT 28.5 (L) 03/27/2023 0704   PLT 118 (L) 03/27/2023 0704   MCV 89.9 03/27/2023 0704   MCH 28.1 03/27/2023 0704   MCHC 31.2 03/27/2023 0704   RDW 15.2 03/27/2023 0704   LYMPHSABS 1.3 03/27/2023 0704   MONOABS 0.4 03/27/2023 0704   EOSABS 0.0 03/27/2023 0704   BASOSABS 0.1 03/27/2023 0704    BMET    Component Value Date/Time   NA 136 03/27/2023 0704   K 3.8 03/27/2023 0704   CL 100 03/27/2023 0704   CO2 23 03/27/2023 0704   GLUCOSE 126 (H) 03/27/2023 0704   BUN 35 (H) 03/27/2023 0704   CREATININE 5.75 (H) 03/27/2023 0704   CALCIUM 8.0 (L) 03/27/2023 0704   GFRNONAA 10 (L) 03/27/2023 0704   GFRAA 48 (L) 01/31/2020 1216    INR    Component Value Date/Time   INR 1.1 03/06/2023 1026     Intake/Output Summary (Last 24 hours) at 03/27/2023 1328 Last data filed at 03/27/2023 1243 Gross per 24 hour  Intake --  Output 800 ml  Net -800 ml     Assessment/Plan:  62 y.o. male is s/p L arm AVG excision due to infection; TDC replaced 10/12 2 Days Post-Op   L arm incision is healing well TDC exit site with slow bleed; manual pressure was held and dermabond was applied.  Call vascular if any further bleeding noted.   Emilie Rutter, PA-C [facility] [phone] 03/27/2023 1:28 PM

## 2023-03-27 NOTE — Procedures (Signed)
HD Note:  Some information was entered later than the data was gathered due to patient care needs. The stated time with the data is accurate.  Received patient in bed to unit.   Alert and oriented to self and situation  Informed consent signed and in chart.   Access used: Right upper chest HD catheter Access issues: Catheter insertion site oozing blood.  Dressing changed and pressure dressing applied.  Patient tolerated treatment well.   TX duration: 3.25  Alert, without acute distress.  Total UF removed: 800 ml  Hand-off given to patient's nurse.   Transported back to the room   Neylan Koroma L. Dareen Piano, RN [facility] Dialysis [facility].

## 2023-03-27 NOTE — Progress Notes (Addendum)
[facility] Progress Note   Subjective:   Seen at onset of HD - TDC bandage bloody, being changed by RN. He is confused but calm today. Denies CP/dyspnea.  Objective Vitals:   03/26/23 0857 03/26/23 1535 03/26/23 1957 03/27/23 0339  BP: (!) 140/78 130/72 130/65 (!) 140/73  Pulse: 79  70 60  Resp: 18 16 18 15   Temp: 98 F (36.7 C) 98 F (36.7 C) 98 F (36.7 C) 97.7 F (36.5 C)  TempSrc:  Oral Oral   SpO2: 100% 99% 98% 99%  Weight:   60.6 kg   Height:       Physical Exam General: Frail appearing man, NAD. Calm. Room air Heart: RRR; no murmur Lungs: CTA anteriorly Abdomen: soft Extremities: no LE edema Dialysis Access: TDC, staples in place LUE  Additional Objective Labs: Basic Metabolic Panel: Recent Labs  Lab 03/25/23 1302 03/26/23 0851 03/27/23 0704  NA 143 136 136  K 4.0 3.8 3.8  CL 103 101 100  CO2 24 22 23   GLUCOSE 136* 78 126*  BUN 44* 24* 35*  CREATININE 7.56* 4.57* 5.75*  CALCIUM 8.5* 8.2* 8.0*  PHOS 3.5  --  3.1   Liver Function Tests: Recent Labs  Lab 03/27/23 0704  ALBUMIN 2.0*   CBC: Recent Labs  Lab 03/22/23 1058 03/23/23 0917 03/25/23 1302 03/26/23 0851 03/27/23 0704  WBC 6.3 7.1 3.9* 4.7 4.0  NEUTROABS 4.7 4.9 2.4 2.6 2.2  HGB 10.0* 10.5* 9.3* 10.1* 8.9*  HCT 31.7* 32.7* 29.6* 32.0* 28.5*  MCV 88.5 91.1 90.8 91.7 89.9  PLT 137* 167 141* 128* 118*   Blood Culture    Component Value Date/Time   SDES BLOOD RIGHT ARM 03/21/2023 1406   SPECREQUEST  03/21/2023 1406    BOTTLES DRAWN AEROBIC AND ANAEROBIC Blood Culture adequate volume   CULT  03/21/2023 1406    NO GROWTH 5 DAYS Performed at [facility], [address] [place], [place] [address]    REPTSTATUS 03/26/2023 FINAL 03/21/2023 1406   Studies/Results: DG CHEST PORT 1 VIEW  Result Date: 03/25/2023 CLINICAL DATA:  [id number] ESRD (end stage renal disease) (HCC) [id number] EXAM: PORTABLE CHEST - 1 VIEW COMPARISON:  03/20/2023 FINDINGS: Tunneled right IJ  hemodialysis catheter is been placed to the cavoatrial junction. No pneumothorax. Lungs clear. Heart size and mediastinal contours are within normal limits. Aortic Atherosclerosis (ICD10-170.0). No effusion. Surgical staples left axilla. IMPRESSION: No acute cardiopulmonary disease. Electronically Signed   By: Corlis Leak M.D.   On: 03/25/2023 13:35   DG C-Arm 1-60 Min  Result Date: 03/25/2023 CLINICAL DATA:  surgery EXAM: DG C-ARM 1-60 MIN COMPARISON:  None Available. FINDINGS: Single fluoroscopic spot image documents tunneled right IJ catheter to the cavoatrial junction. No visualized pneumothorax. IMPRESSION: Right IJ catheter to cavoatrial junction. Electronically Signed   By: Corlis Leak M.D.   On: 03/25/2023 13:35    Medications:  vancomycin Stopped (03/25/23 1556)    amLODipine  10 mg Oral q morning   atorvastatin  20 mg Oral q morning   Chlorhexidine Gluconate Cloth  6 each Topical Q0600   darbepoetin (ARANESP) injection - DIALYSIS  60 mcg Subcutaneous Q Thu-1800   feeding supplement (NEPRO CARB STEADY)  237 mL Oral TID BM   heparin injection (subcutaneous)  5,000 Units Subcutaneous Q8H   hydrALAZINE  10 mg Oral Q8H   insulin aspart  0-9 Units Subcutaneous TID WC   levothyroxine  25 mcg Oral QAC breakfast   liver oil-zinc oxide  Topical Daily   metoprolol tartrate  50 mg Oral BID   multivitamin with minerals  1 tablet Oral Daily   pantoprazole  40 mg Oral Daily   polyethylene glycol  17 g Oral Daily   sodium chloride flush  10-40 mL Intracatheter Q12H    Dialysis Orders: MWF at [facility] 4hr, 400/1.5, EDW 64kg, 3K/2.5Ca, AVG, no heparin - Mircera IV q 2 weeks (last 9/11) - Venofer 50mg  IV q week  Assessment/Plan: MRSA + Enterococcus bacteremia/MV endocarditis: ID and vascular surgery following. AVG seeded, s/p excision of AVG. TDC placed 10/12 by VVS. TEE 10/7 with MV endocarditis noted. On Vanc q HD for 6 week course (9/24 - 05/02/2023) AMS: Waxing/waning per notes. ESRD:  Continue HD on MWF schedule - HD now. HTN/volume: BP high, no edema. UF as tolerated - lower EDW on d/c. Continue home meds. COVID pneumonia (+ 9/23): S/p remdesivir. Out of isolation. Anemia of ESRD: Hgb down to 8.9 - continue Aranesp q Thursday - likely ^ next dose. Secondary HPTH: Ca/Phos to goal - no binders. T2DM: Per primary. Nutrition: Alb low, continue supplements. Debility: Chronic issue. Previously refusing SNF per notes - now agreeable.    Ozzie Hoyle, PA-C 03/27/2023, 9:01 AM  [employer]

## 2023-03-27 NOTE — Progress Notes (Signed)
PT Cancellation Note  Patient Details Name: [name] [name] MRN: [id number] DOB: [birth year]   Cancelled Treatment:    Reason Eval/Treat Not Completed: Patient at procedure or test/unavailable. Pt at HDU, PT will follow up as time allows.   Arlyss Gandy 03/27/2023, 9:50 AM

## 2023-03-27 NOTE — Progress Notes (Signed)
Physical Therapy Treatment Patient Details Name: [name] [name] MRN: [id number] DOB: 1961-03-05 Today's Date: 03/27/2023   History of Present Illness Gennady Mossbarger is a 62 y.o. male who presents to the ED with worsening mental status. He was admitted with acute metabolic encephalopathy secondary to UTI and COVID-pneumonia and also missed hemodialysis sections. He was found to have MRSA and enterococcal bacteremia. He was started on Vancomycin. Patient was transferred to [facility] for fistula removal. 10/2 s/p placement of temporary dialysis catheter (20cm RIJ) using ultrasound and fluoroscopic guidance, harvest of left basilic vein, direct repair of left brachial artery with vein patch angioplasty, excision of left arm arteriovenous graft.  10/11: Pt slid OOB, no injuries. PMH: ESRD on HD MWF, hypertension, type 2 diabetes, pulmonary hypertension, cardiomyopathy, GERD, dyslipidemia, and anemia of CKD.    PT Comments  Pt received in recliner, anxious and perseverating on desire to eat dinner, tangential and difficult to redirect. Pt c/o fear of falls when encouraged to work on transfers and seated/standing balance, pt impulsive to recline back in chair but eventually requesting to pivot OOB to [facility]. Pt needing up to +2 mod/maxA for safety with step pivot to/from [facility] Once on [facility], pt reports he does not need to have BM or urinate and pt assisted to return to recliner. Pt mother present and encouraging him, but pt not receptive to family or staff instruction. Pt remains a high fall risk due to anxiety and poor command following while standing and poor RW management. Pt back in recliner with chair alarm on for safety at end of session, RN/NT notified, mother present as well. Pt continues to benefit from PT services to progress toward functional mobility goals.    If plan is discharge home, recommend the following: A lot of help with bathing/dressing/bathroom;Assistance with cooking/housework;Help with stairs  or ramp for entrance;Assist for transportation;Two people to help with walking and/or transfers;Direct supervision/assist for medications management;Direct supervision/assist for financial management;Supervision due to cognitive status   Can travel by private vehicle     No  Equipment Recommendations  BSC/3in1;Hoyer lift;Other (comment) (consider hospital bed, wheelchair)    Recommendations for Other Services       Precautions / Restrictions Precautions Precautions: Fall Precaution Comments: history of falls, Contact precs Restrictions Weight Bearing Restrictions: No     Mobility  Bed Mobility Overal bed mobility: Needs Assistance             General bed mobility comments: pt already up in chair    Transfers Overall transfer level: Needs assistance Equipment used: Rolling walker (2 wheels) Transfers: Sit to/from Stand, Bed to chair/wheelchair/BSC Sit to Stand: Mod assist, +2 safety/equipment   Step pivot transfers: From elevated surface, Max assist, +2 safety/equipment       General transfer comment: STS from chair<>RW with modA (RN CGA for pt safety), dense cues for safe UE placement. Pt states "I'm going to fall" and moans repeatedly despite max reassurance and redirection during transfer. +2 mod/maxA for pivotal steps to/from [facility] as pt impulsive and ignores cues for safe UE/LE placement.    Ambulation/Gait                   Stairs             Wheelchair Mobility     Tilt Bed    Modified Rankin (Stroke Patients Only)       Balance Overall balance assessment: Needs assistance Sitting-balance support: Feet supported, Bilateral upper extremity supported Sitting balance-Leahy Scale: Fair Sitting  balance - Comments: fair to poor, due to behaviors/impulsivity to return to supine, pt tolerates sitting EOB ~5 mins total Postural control: Posterior lean, Right lateral lean (toward HOB) Standing balance support: Reliant on assistive device for  balance, Bilateral upper extremity supported Standing balance-Leahy Scale: Poor Standing balance comment: mod/maxA despite RW due to cognitive deficit and poor receptiveness to safety cues.                            Cognition Arousal: Alert Behavior During Therapy: Anxious, Restless, Lability, Impulsive Overall Cognitive Status: History of cognitive impairments - at baseline Area of Impairment: Safety/judgement, Following commands, Problem solving, Awareness                     Memory: Decreased recall of precautions, Decreased short-term memory Following Commands: Follows one step commands with increased time Safety/Judgement: Decreased awareness of safety, Decreased awareness of deficits Awareness: Intellectual Problem Solving: Difficulty sequencing, Requires verbal cues, Requires tactile cues, Decreased initiation General Comments: Pt very self-limiting and anxious regarding transfers, repeatedly expresses fear of falls and tangential, expressing frustration over dinner not arrived yet. PTA offered choices with him such as protein shake or snacks but pt refusing all options given. Poor carryover of safety instructions. Mother present in room, trying to encourage him but pt not receptive to encouragement this date. She states he has been "stubborn" since admission (~20 days) and before that would listen to her and follow instructions at home although was still unsteady and falling frequently and causing her to fall; pt appears to have intellectual disability at baseline.        Exercises Other Exercises Other Exercises:  (pt refusing today, difficult to redirect)    General Comments General comments (skin integrity, edema, etc.): HR 80's bpm throughout      Pertinent Vitals/Pain Pain Assessment Pain Assessment: Faces Faces Pain Scale: Hurts little more Breathing: normal Negative Vocalization: occasional moan/groan, low speech, negative/disapproving  quality Facial Expression: sad, frightened, frown Body Language: tense, distressed pacing, fidgeting Consolability: distracted or reassured by voice/touch PAINAD Score: 4 Pain Location: pt at one point c/o abdominal discomfort, then denied it. Pain Descriptors / Indicators: Guarding, Grimacing, Moaning Pain Intervention(s): Monitored during session, Limited activity within patient's tolerance, Repositioned     PT Goals (current goals can now be found in the care plan section) Acute Rehab PT Goals Patient Stated Goal: to go home PT Goal Formulation: With patient/family Time For Goal Achievement: 04/05/23 Progress towards PT goals: Progressing toward goals (slowly)    Frequency    Min 1X/week      PT Plan         AM-PAC PT "6 Clicks" Mobility   Outcome Measure  Help needed turning from your back to your side while in a flat bed without using bedrails?: A Little Help needed moving from lying on your back to sitting on the side of a flat bed without using bedrails?: A Lot Help needed moving to and from a bed to a chair (including a wheelchair)?: Total Help needed standing up from a chair using your arms (e.g., wheelchair or bedside chair)?: A Lot Help needed to walk in hospital room?: Total Help needed climbing 3-5 steps with a railing? : Total 6 Click Score: 10    End of Session Equipment Utilized During Treatment: Gait belt Activity Tolerance: Treatment limited secondary to medical complications (Comment);Other (comment);Patient limited by fatigue (self-limiting behaviors) Patient left: with call  bell/phone within reach;Other (comment);with chair alarm set;in chair;with family/visitor present (reclined in chair; mother in room) Nurse Communication: Mobility status;Need for lift equipment;Other (comment) PT Visit Diagnosis: Unsteadiness on feet (R26.81);Other abnormalities of gait and mobility (R26.89);Muscle weakness (generalized) (M62.81)     Time: 1610-9604 PT Time  Calculation (min) (ACUTE ONLY): 30 min  Charges:    $Therapeutic Activity: 23-37 mins PT General Charges $$ ACUTE PT VISIT: 1 Visit                     Jeriel Vivanco P., PTA Acute Rehabilitation Services Secure Chat Preferred 9a-5:30pm Office: [phone]    [name] [facility] 03/27/2023, 5:01 PM

## 2023-03-28 DIAGNOSIS — I059 Rheumatic mitral valve disease, unspecified: Secondary | ICD-10-CM | POA: Diagnosis not present

## 2023-03-28 LAB — GLUCOSE, CAPILLARY
Glucose-Capillary: 126 mg/dL — ABNORMAL HIGH (ref 70–99)
Glucose-Capillary: 98 mg/dL (ref 70–99)
Glucose-Capillary: 224 mg/dL — ABNORMAL HIGH (ref 70–99)
Glucose-Capillary: 116 mg/dL — ABNORMAL HIGH (ref 70–99)

## 2023-03-28 NOTE — TOC Progression Note (Signed)
Transition of Care [facility]) - Progression Note    Patient Details  Name: [name] [name] MRN: [id number] Date of Birth: [birth year]  Transition of Care [facility]) CM/SW Contact  Erin Sons, [place] Phone Number: 03/28/2023, 9:53 AM  Clinical Narrative:     CSW attempted to initiate auth on online portal. Online system is down. The system is also down for the insurance reviewers so auth cannot be called in by phone either. No info available on when it will be back up. TOC will continue to follow.   Expected Discharge Plan: Skilled Nursing Facility Barriers to Discharge: Insurance Authorization  Expected Discharge Plan and Services In-house Referral: Clinical Social Work     Living arrangements for the past 2 months: Single Family Home                                       Social Determinants of Health (SDOH) Interventions SDOH Screenings   Food Insecurity: No Food Insecurity (03/06/2023)  Housing: Low Risk  (03/06/2023)  Transportation Needs: No Transportation Needs (03/06/2023)  Utilities: Not At Risk (03/06/2023)  Depression (PHQ2-9): Low Risk  (09/18/2018)  Tobacco Use: Low Risk  (03/25/2023)    Readmission Risk Interventions    03/07/2023    1:31 PM  Readmission Risk Prevention Plan  Transportation Screening Complete  HRI or Home Care Consult Complete  Palliative Care Screening Not Applicable  Medication Review (RN Care Manager) Complete

## 2023-03-28 NOTE — Progress Notes (Signed)
  [facility] Progress Note   Subjective:  Seen in room - confused but calm. No dyspnea/CP. No further TDC bleeding. SNF authorization pending.  Objective Vitals:   03/27/23 1656 03/27/23 2035 03/28/23 0452 03/28/23 0845  BP: (!) 160/83 (!) 154/87 (!) 142/81 96/80  Pulse: 79 80 78 88  Resp: 16 19 19 18   Temp: 98.3 F (36.8 C) 98.5 F (36.9 C) 98.3 F (36.8 C) 98.3 F (36.8 C)  TempSrc:    Oral  SpO2: 100% 99% 100% 100%  Weight:      Height:       Physical Exam General: Frail appearing man, NAD. Calm. Room air Heart: RRR; no murmur Lungs: CTA anteriorly Abdomen: soft Extremities: no LE edema Dialysis Access: TDC, staples in place LUE  Additional Objective Labs: Basic Metabolic Panel: Recent Labs  Lab 03/25/23 1302 03/26/23 0851 03/27/23 0704  NA 143 136 136  K 4.0 3.8 3.8  CL 103 101 100  CO2 24 22 23   GLUCOSE 136* 78 126*  BUN 44* 24* 35*  CREATININE 7.56* 4.57* 5.75*  CALCIUM 8.5* 8.2* 8.0*  PHOS 3.5  --  3.1   Liver Function Tests: Recent Labs  Lab 03/27/23 0704  ALBUMIN 2.0*   CBC: Recent Labs  Lab 03/22/23 1058 03/23/23 0917 03/25/23 1302 03/26/23 0851 03/27/23 0704  WBC 6.3 7.1 3.9* 4.7 4.0  NEUTROABS 4.7 4.9 2.4 2.6 2.2  HGB 10.0* 10.5* 9.3* 10.1* 8.9*  HCT 31.7* 32.7* 29.6* 32.0* 28.5*  MCV 88.5 91.1 90.8 91.7 89.9  PLT 137* 167 141* 128* 118*   Medications:  vancomycin 750 mg (03/27/23 1134)    amLODipine  10 mg Oral q morning   atorvastatin  20 mg Oral q morning   Chlorhexidine Gluconate Cloth  6 each Topical Q0600   darbepoetin (ARANESP) injection - DIALYSIS  60 mcg Subcutaneous Q Thu-1800   feeding supplement (NEPRO CARB STEADY)  237 mL Oral TID BM   heparin injection (subcutaneous)  5,000 Units Subcutaneous Q8H   hydrALAZINE  10 mg Oral Q8H   insulin aspart  0-9 Units Subcutaneous TID WC   levothyroxine  25 mcg Oral QAC breakfast   liver oil-zinc oxide   Topical Daily   metoprolol tartrate  50 mg Oral BID    multivitamin with minerals  1 tablet Oral Daily   pantoprazole  40 mg Oral Daily   polyethylene glycol  17 g Oral Daily   sodium chloride flush  10-40 mL Intracatheter Q12H   Dialysis Orders: MWF at [facility] 4[facility], 400/1.5, EDW 64kg, 3K/2.5Ca, AVG, no heparin - Mircera IV q 2 weeks (last 9/11) - Venofer 50mg  IV q week   Assessment/Plan: MRSA + Enterococcus bacteremia/MV endocarditis: ID and vascular surgery following. AVG seeded, s/p excision of AVG. TDC placed 10/12 by VVS. TEE 10/7 with MV endocarditis noted. On Vanc 750mg  q HD for 6 week course (9/24 - 05/02/2023) AMS: Waxing/waning per notes. ESRD: Continue HD on MWF schedule - next HD 10/16. HTN/volume: BP high, no edema. UF as tolerated - lower EDW on d/c. Continue home meds. COVID pneumonia (+ 9/23): S/p remdesivir. Out of isolation. Anemia of ESRD: Hgb down to 8.9 - continue Aranesp q Thursday - likely ^ next dose. Secondary HPTH: Ca/Phos to goal - no binders. T2DM: Per primary. Nutrition: Alb low, continue supplements. Debility: Chronic issue. Previously refusing SNF per notes - now agreeable, authorization P.   Ozzie Hoyle, PA-C 03/28/2023, 10:07 AM  [facility]

## 2023-03-28 NOTE — TOC Progression Note (Signed)
Transition of Care [facility]) - Progression Note    Patient Details  Name: [name] MRN: [id number] Date of Birth: 01/06/1961  Transition of Care [facility]) CM/SW Contact  Erin Sons, [place] Phone Number: 03/28/2023, 2:53 PM  Clinical Narrative:     CSW able to access insurance online portal. SNF auth request initiated. Status: pending   Expected Discharge Plan: Skilled Nursing Facility Barriers to Discharge: Insurance Authorization  Expected Discharge Plan and Services In-house Referral: Clinical Social Work     Living arrangements for the past 2 months: Single Family Home                                       Social Determinants of Health (SDOH) Interventions SDOH Screenings   Food Insecurity: No Food Insecurity (03/06/2023)  Housing: Low Risk  (03/06/2023)  Transportation Needs: No Transportation Needs (03/06/2023)  Utilities: Not At Risk (03/06/2023)  Depression (PHQ2-9): Low Risk  (09/18/2018)  Tobacco Use: Low Risk  (03/25/2023)    Readmission Risk Interventions    03/07/2023    1:31 PM  Readmission Risk Prevention Plan  Transportation Screening Complete  HRI or Home Care Consult Complete  Palliative Care Screening Not Applicable  Medication Review (RN Care Manager) Complete

## 2023-03-28 NOTE — Plan of Care (Signed)
  Problem: Education: Goal: Knowledge of General Education information will improve Description: Including pain rating scale, medication(s)/side effects and non-pharmacologic comfort measures Outcome: Progressing   Problem: Health Behavior/Discharge Planning: Goal: Ability to manage health-related needs will improve Outcome: Progressing   Problem: Clinical Measurements: Goal: Ability to maintain clinical measurements within normal limits will improve Outcome: Progressing Goal: Will remain free from infection Outcome: Progressing Goal: Diagnostic test results will improve Outcome: Progressing Goal: Respiratory complications will improve Outcome: Progressing Goal: Cardiovascular complication will be avoided Outcome: Progressing   Problem: Activity: Goal: Risk for activity intolerance will decrease Outcome: Progressing   Problem: Nutrition: Goal: Adequate nutrition will be maintained Outcome: Progressing   Problem: Coping: Goal: Level of anxiety will decrease Outcome: Progressing   Problem: Elimination: Goal: Will not experience complications related to bowel motility Outcome: Progressing Goal: Will not experience complications related to urinary retention Outcome: Progressing   Problem: Pain Managment: Goal: General experience of comfort will improve Outcome: Progressing   Problem: Safety: Goal: Ability to remain free from injury will improve Outcome: Progressing   Problem: Skin Integrity: Goal: Risk for impaired skin integrity will decrease Outcome: Progressing   Problem: Education: Goal: Knowledge of risk factors and measures for prevention of condition will improve Outcome: Progressing   Problem: Coping: Goal: Psychosocial and spiritual needs will be supported Outcome: Progressing   Problem: Respiratory: Goal: Will maintain a patent airway Outcome: Progressing Goal: Complications related to the disease process, condition or treatment will be avoided or  minimized Outcome: Progressing   Problem: Education: Goal: Ability to describe self-care measures that may prevent or decrease complications (Diabetes Survival Skills Education) will improve Outcome: Progressing Goal: Individualized Educational Video(s) Outcome: Progressing   Problem: Coping: Goal: Ability to adjust to condition or change in health will improve Outcome: Progressing   Problem: Fluid Volume: Goal: Ability to maintain a balanced intake and output will improve Outcome: Progressing   Problem: Health Behavior/Discharge Planning: Goal: Ability to identify and utilize available resources and services will improve Outcome: Progressing Goal: Ability to manage health-related needs will improve Outcome: Progressing   Problem: Metabolic: Goal: Ability to maintain appropriate glucose levels will improve Outcome: Progressing   Problem: Nutritional: Goal: Maintenance of adequate nutrition will improve Outcome: Progressing Goal: Progress toward achieving an optimal weight will improve Outcome: Progressing   Problem: Skin Integrity: Goal: Risk for impaired skin integrity will decrease Outcome: Progressing   Problem: Tissue Perfusion: Goal: Adequacy of tissue perfusion will improve Outcome: Progressing   Problem: Education: Goal: Knowledge of disease and its progression will improve Outcome: Progressing   Problem: Fluid Volume: Goal: Compliance with measures to maintain balanced fluid volume will improve Outcome: Progressing   Problem: Health Behavior/Discharge Planning: Goal: Ability to manage health-related needs will improve Outcome: Progressing   Problem: Nutritional: Goal: Ability to make healthy dietary choices will improve Outcome: Progressing   Problem: Clinical Measurements: Goal: Complications related to the disease process, condition or treatment will be avoided or minimized Outcome: Progressing   Problem: Safety: Goal: Non-violent  Restraint(s) Outcome: Progressing

## 2023-03-28 NOTE — Progress Notes (Signed)
OT Cancellation Note  Patient Details Name: [name] [name] MRN: [id number] DOB: 22-Jul-1960   Cancelled Treatment:    Reason Eval/Treat Not Completed: (P) Patient declined, no reason specified, attempted multiple times today, Pt refused each time due to feeling tired will reattempt as able.  Alexis Goodell 03/28/2023, 4:27 PM

## 2023-03-28 NOTE — Progress Notes (Signed)
PROGRESS NOTE    [name] [name]  ZOX:[id number] DOB: 1961-06-02 DOA: 03/06/2023 PCP: Farris Has, MD   Brief Narrative: 63 year old with past medical history significant for ESRD on hemodialysis MWF, hypertension, diabetes type 2, pulmonary hypertension, cardiomyopathy, GERD, dyslipidemia, anemia of CKD presents to the ED with worsening mental status.  He was admitted with acute metabolic encephalopathy secondary to UTI and COVID-pneumonia and also missed hemodialysis sections.  He was found to have MRSA and enterococcal bacteremia. He was started on Vancomycin.  ID has been following.  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  Dialysis catheter tip in the right atrium with 6.4 mm x 2.8 mm mobile mass consistent with vegetation ID consulted, s/p holiday line after removal on 10/8, replaced TDC on 10/12 by vascular after BC remained NGTD Continue IV vancomycin for 6 weeks with HD (10/8-11/19)  COVID infection Tested positive on 9/23 Completed course of Remdesivir, steroid Off isolation since 10/03  ESRD on hemodialysis MWF Managed per  nephrology Underwent excision of AV fistula due to infection Temporary HD catheter with vegetation, ID consulted, s/p holiday line after removal on 10/8, replaced TDC on 10/12 by vascular after BC remained negative  Acute metabolic encephalopathy Improved In the setting of UTI/COVID, missed hemodialysis and bacteremia Likely has some cognitive impairment at baseline  Anemia of chronic kidney disease Stable  Hypertension Continue with metoprolol and amlodipine, added hydralazine  Diabetes type 2 Continue with sliding scale insulin  Hypothyroidism Continue with Synthroid  GERD Continue with PPI  Thrombocytopenia Improving  Hypoalbuminemia On supplements       Nutrition Problem: Severe Malnutrition Etiology: chronic illness    Signs/Symptoms: severe muscle depletion, severe fat depletion    Interventions: Nepro shake  Estimated body mass index is 19.49 kg/m as calculated from the following:   Height as of this encounter: 5\' 10"  (1.778 m).   Weight as of this encounter: 61.6 kg.   DVT prophylaxis: Heparin [place] Code Status: Full code Family Communication: None at bedside Disposition Plan:  Status is: Inpatient Remains inpatient appropriate because: Management of bacteremia.     Consultants:  Nephrology ID Vascular  Procedures:  Graft, TDC removal and re-insertion  Antimicrobials:  Vancomycin  Subjective: Denies any new complaints    Objective: Vitals:   03/27/23 1656 03/27/23 2035 03/28/23 0452 03/28/23 0845  BP: (!) 160/83 (!) 154/87 (!) 142/81 96/80  Pulse: 79 80 78 88  Resp: 16 19 19 18   Temp: 98.3 F (36.8 C) 98.5 F (36.9 C) 98.3 F (36.8 C) 98.3 F (36.8 C)  TempSrc:    Oral  SpO2: 100% 99% 100% 100%  Weight:      Height:  Intake/Output Summary (Last 24 hours) at 03/28/2023 1257 Last data filed at 03/28/2023 1244 Gross per 24 hour  Intake 360 ml  Output 0 ml  Net 360 ml   Filed Weights   03/25/23 1604 03/26/23  1957 03/27/23 0924  Weight: 57.3 kg 60.6 kg 61.6 kg    Examination: General: NAD, chronically ill appearing  Cardiovascular: S1, S2 present Respiratory: CTAB Abdomen: Soft, nontender, nondistended, bowel sounds present Musculoskeletal: No bilateral pedal edema noted Skin: Normal Psychiatry: fair mood     Data Reviewed: I have personally reviewed following labs and imaging studies  CBC: Recent Labs  Lab 03/22/23 1058 03/23/23 0917 03/25/23 1302 03/26/23 0851 03/27/23 0704  WBC 6.3 7.1 3.9* 4.7 4.0  NEUTROABS 4.7 4.9 2.4 2.6 2.2  HGB 10.0* 10.5* 9.3* 10.1* 8.9*  HCT 31.7* 32.7* 29.6* 32.0* 28.5*  MCV 88.5 91.1 90.8 91.7 89.9  PLT 137* 167 141* 128* 118*   Basic Metabolic Panel: Recent Labs  Lab 03/23/23 0917 03/24/23 0447 03/25/23 1302 03/26/23 0851 03/27/23 0704  NA 139 139 143 136 136  K 3.9 3.4* 4.0 3.8 3.8  CL 101 100 103 101 100  CO2 23 25 24 22 23   GLUCOSE 128* 112* 136* 78 126*  BUN 32* 38* 44* 24* 35*  CREATININE 5.67* 6.30* 7.56* 4.57* 5.75*  CALCIUM 8.6* 8.2* 8.5* 8.2* 8.0*  PHOS  --   --  3.5  --  3.1   GFR: Estimated Creatinine Clearance: 11.3 mL/min (A) (by C-G formula based on SCr of 5.75 mg/dL (H)). Liver Function Tests: Recent Labs  Lab 03/27/23 0704  ALBUMIN 2.0*    No results for input(s): "LIPASE", "AMYLASE" in the last 168 hours. No results for input(s): "AMMONIA" in the last 168 hours. Coagulation Profile: No results for input(s): "INR", "PROTIME" in the last 168 hours. Cardiac Enzymes: No results for input(s): "CKTOTAL", "CKMB", "CKMBINDEX", "TROPONINI" in the last 168 hours. BNP (last 3 results) No results for input(s): "PROBNP" in the last 8760 hours. HbA1C: No results for input(s): "HGBA1C" in the last 72 hours. CBG: Recent Labs  Lab 03/27/23 1402 03/27/23 1638 03/27/23 2038 03/28/23 0755 03/28/23 1142  GLUCAP 148* 268* 85 126* 98   Lipid Profile: No results for input(s): "CHOL", "HDL", "LDLCALC", "TRIG",  "CHOLHDL", "LDLDIRECT" in the last 72 hours. Thyroid Function Tests: No results for input(s): "TSH", "T4TOTAL", "FREET4", "T3FREE", "THYROIDAB" in the last 72 hours. Anemia Panel: No results for input(s): "VITAMINB12", "FOLATE", "FERRITIN", "TIBC", "IRON", "RETICCTPCT" in the last 72 hours. Sepsis Labs: No results for input(s): "PROCALCITON", "LATICACIDVEN" in the last 168 hours.  Recent Results (from the past 240 hour(s))  Culture, blood (Routine X 2) w Reflex to ID Panel     Status: None   Collection Time: 03/21/23  2:03 PM   Specimen: BLOOD RIGHT HAND  Result Value Ref Range Status   Specimen Description BLOOD RIGHT HAND  Final   Special Requests   Final    BOTTLES DRAWN AEROBIC AND ANAEROBIC Blood Culture adequate volume   Culture   Final    NO GROWTH 5 DAYS Performed at [facility], [address], [place], [place] [address]    Report Status 03/26/2023 FINAL  Final  Culture, blood (Routine X 2) w Reflex to ID Panel     Status: None   Collection Time: 03/21/23  2:06 PM   Specimen: BLOOD RIGHT ARM  Result Value Ref Range Status   Specimen Description BLOOD RIGHT ARM  Final   Special  Requests   Final    BOTTLES DRAWN AEROBIC AND ANAEROBIC Blood Culture adequate volume   Culture   Final    NO GROWTH 5 DAYS Performed at [facility], [address], [place], [place] [address]    Report Status 03/26/2023 FINAL  Final         Radiology Studies: No results found.      Scheduled Meds:  amLODipine  10 mg Oral q morning   atorvastatin  20 mg Oral q morning   Chlorhexidine Gluconate Cloth  6 each Topical Q0600   darbepoetin (ARANESP) injection - DIALYSIS  60 mcg Subcutaneous Q Thu-1800   feeding supplement (NEPRO CARB STEADY)  237 mL Oral TID BM   heparin injection (subcutaneous)  5,000 Units Subcutaneous Q8H   hydrALAZINE  10 mg Oral Q8H   insulin aspart  0-9 Units Subcutaneous TID WC   levothyroxine  25 mcg Oral QAC breakfast   liver oil-zinc oxide    Topical Daily   metoprolol tartrate  50 mg Oral BID   multivitamin with minerals  1 tablet Oral Daily   pantoprazole  40 mg Oral Daily   polyethylene glycol  17 g Oral Daily   sodium chloride flush  10-40 mL Intracatheter Q12H   Continuous Infusions:  vancomycin 750 mg (03/27/23 1134)     LOS: 22 days      Briant Cedar, MD [facility]   If 7PM-7AM, please contact night-coverage [email or web address]  03/28/2023, 12:57 PM

## 2023-03-28 NOTE — Progress Notes (Signed)
  Progress Note    03/28/2023 8:21 AM 3 Days Post-Op  Subjective:  no complaints   Vitals:   03/27/23 2035 03/28/23 0452  BP: (!) 154/87 (!) 142/81  Pulse: 80 78  Resp: 19 19  Temp: 98.5 F (36.9 C) 98.3 F (36.8 C)  SpO2: 99% 100%   Physical Exam: Lungs:  non labored Incisions:  L arm healing well; R internal jugular TDC without bleeding Neurologic: somnolent  CBC    Component Value Date/Time   WBC 4.0 03/27/2023 0704   RBC 3.17 (L) 03/27/2023 0704   HGB 8.9 (L) 03/27/2023 0704   HCT 28.5 (L) 03/27/2023 0704   PLT 118 (L) 03/27/2023 0704   MCV 89.9 03/27/2023 0704   MCH 28.1 03/27/2023 0704   MCHC 31.2 03/27/2023 0704   RDW 15.2 03/27/2023 0704   LYMPHSABS 1.3 03/27/2023 0704   MONOABS 0.4 03/27/2023 0704   EOSABS 0.0 03/27/2023 0704   BASOSABS 0.1 03/27/2023 0704    BMET    Component Value Date/Time   NA 136 03/27/2023 0704   K 3.8 03/27/2023 0704   CL 100 03/27/2023 0704   CO2 23 03/27/2023 0704   GLUCOSE 126 (H) 03/27/2023 0704   BUN 35 (H) 03/27/2023 0704   CREATININE 5.75 (H) 03/27/2023 0704   CALCIUM 8.0 (L) 03/27/2023 0704   GFRNONAA 10 (L) 03/27/2023 0704   GFRAA 48 (L) 01/31/2020 1216    INR    Component Value Date/Time   INR 1.1 03/06/2023 1026     Intake/Output Summary (Last 24 hours) at 03/28/2023 8119 Last data filed at 03/28/2023 0604 Gross per 24 hour  Intake 120 ml  Output 800 ml  Net -680 ml     Assessment/Plan:  62 y.o. male is s/p [facility] replacement  3 Days Post-Op   No further bleeding from R internal jugular TDC exit site overnight Staples can be removed from L arm later this week IV abx per ID   Emilie Rutter, PA-C [facility] [phone] 03/28/2023 8:21 AM

## 2023-03-29 DIAGNOSIS — T827XXS Infection and inflammatory reaction due to other cardiac and vascular devices, implants and grafts, sequela: Secondary | ICD-10-CM | POA: Diagnosis not present

## 2023-03-29 DIAGNOSIS — R4182 Altered mental status, unspecified: Secondary | ICD-10-CM | POA: Diagnosis not present

## 2023-03-29 DIAGNOSIS — N2581 Secondary hyperparathyroidism of renal origin: Secondary | ICD-10-CM | POA: Diagnosis not present

## 2023-03-29 DIAGNOSIS — R41841 Cognitive communication deficit: Secondary | ICD-10-CM | POA: Diagnosis not present

## 2023-03-29 DIAGNOSIS — Z7189 Other specified counseling: Secondary | ICD-10-CM | POA: Diagnosis not present

## 2023-03-29 DIAGNOSIS — K219 Gastro-esophageal reflux disease without esophagitis: Secondary | ICD-10-CM | POA: Diagnosis not present

## 2023-03-29 DIAGNOSIS — D696 Thrombocytopenia, unspecified: Secondary | ICD-10-CM | POA: Diagnosis not present

## 2023-03-29 DIAGNOSIS — U071 COVID-19: Secondary | ICD-10-CM | POA: Diagnosis not present

## 2023-03-29 DIAGNOSIS — E876 Hypokalemia: Secondary | ICD-10-CM | POA: Diagnosis not present

## 2023-03-29 DIAGNOSIS — R2681 Unsteadiness on feet: Secondary | ICD-10-CM | POA: Diagnosis not present

## 2023-03-29 DIAGNOSIS — R296 Repeated falls: Secondary | ICD-10-CM | POA: Diagnosis not present

## 2023-03-29 DIAGNOSIS — Z741 Need for assistance with personal care: Secondary | ICD-10-CM | POA: Diagnosis not present

## 2023-03-29 DIAGNOSIS — N186 End stage renal disease: Secondary | ICD-10-CM | POA: Diagnosis not present

## 2023-03-29 DIAGNOSIS — R7881 Bacteremia: Secondary | ICD-10-CM | POA: Diagnosis not present

## 2023-03-29 DIAGNOSIS — F419 Anxiety disorder, unspecified: Secondary | ICD-10-CM | POA: Diagnosis not present

## 2023-03-29 DIAGNOSIS — R0689 Other abnormalities of breathing: Secondary | ICD-10-CM | POA: Diagnosis not present

## 2023-03-29 DIAGNOSIS — I059 Rheumatic mitral valve disease, unspecified: Secondary | ICD-10-CM | POA: Diagnosis not present

## 2023-03-29 DIAGNOSIS — I120 Hypertensive chronic kidney disease with stage 5 chronic kidney disease or end stage renal disease: Secondary | ICD-10-CM | POA: Diagnosis not present

## 2023-03-29 DIAGNOSIS — M6259 Muscle wasting and atrophy, not elsewhere classified, multiple sites: Secondary | ICD-10-CM | POA: Diagnosis not present

## 2023-03-29 DIAGNOSIS — R278 Other lack of coordination: Secondary | ICD-10-CM | POA: Diagnosis not present

## 2023-03-29 DIAGNOSIS — G9341 Metabolic encephalopathy: Secondary | ICD-10-CM | POA: Diagnosis not present

## 2023-03-29 DIAGNOSIS — Z992 Dependence on renal dialysis: Secondary | ICD-10-CM | POA: Diagnosis not present

## 2023-03-29 DIAGNOSIS — R262 Difficulty in walking, not elsewhere classified: Secondary | ICD-10-CM | POA: Diagnosis not present

## 2023-03-29 DIAGNOSIS — M6281 Muscle weakness (generalized): Secondary | ICD-10-CM | POA: Diagnosis not present

## 2023-03-29 DIAGNOSIS — B9562 Methicillin resistant Staphylococcus aureus infection as the cause of diseases classified elsewhere: Secondary | ICD-10-CM | POA: Diagnosis not present

## 2023-03-29 DIAGNOSIS — E119 Type 2 diabetes mellitus without complications: Secondary | ICD-10-CM | POA: Insufficient documentation

## 2023-03-29 DIAGNOSIS — E039 Hypothyroidism, unspecified: Secondary | ICD-10-CM | POA: Diagnosis not present

## 2023-03-29 DIAGNOSIS — I10 Essential (primary) hypertension: Secondary | ICD-10-CM | POA: Diagnosis not present

## 2023-03-29 DIAGNOSIS — J1282 Pneumonia due to coronavirus disease 2019: Secondary | ICD-10-CM | POA: Diagnosis not present

## 2023-03-29 DIAGNOSIS — E1122 Type 2 diabetes mellitus with diabetic chronic kidney disease: Secondary | ICD-10-CM | POA: Diagnosis not present

## 2023-03-29 DIAGNOSIS — D649 Anemia, unspecified: Secondary | ICD-10-CM | POA: Diagnosis not present

## 2023-03-29 DIAGNOSIS — Z7401 Bed confinement status: Secondary | ICD-10-CM | POA: Diagnosis not present

## 2023-03-29 LAB — CBC
HCT: 26.9 % — ABNORMAL LOW (ref 39.0–52.0)
Hemoglobin: 8.7 g/dL — ABNORMAL LOW (ref 13.0–17.0)
MCH: 29.2 pg (ref 26.0–34.0)
MCHC: 32.3 g/dL (ref 30.0–36.0)
MCV: 90.3 fL (ref 80.0–100.0)
Platelets: 118 10*3/uL — ABNORMAL LOW (ref 150–400)
RBC: 2.98 MIL/uL — ABNORMAL LOW (ref 4.22–5.81)
RDW: 15.4 % (ref 11.5–15.5)
WBC: 4.6 10*3/uL (ref 4.0–10.5)
nRBC: 0 % (ref 0.0–0.2)

## 2023-03-29 LAB — GLUCOSE, CAPILLARY: Glucose-Capillary: 117 mg/dL — ABNORMAL HIGH (ref 70–99)

## 2023-03-29 LAB — RENAL FUNCTION PANEL
Albumin: 2.2 g/dL — ABNORMAL LOW (ref 3.5–5.0)
Anion gap: 7 (ref 5–15)
BUN: 30 mg/dL — ABNORMAL HIGH (ref 8–23)
CO2: 28 mmol/L (ref 22–32)
Calcium: 8.4 mg/dL — ABNORMAL LOW (ref 8.9–10.3)
Chloride: 101 mmol/L (ref 98–111)
Creatinine, Ser: 5.24 mg/dL — ABNORMAL HIGH (ref 0.61–1.24)
GFR, Estimated: 12 mL/min — ABNORMAL LOW (ref >60)
Glucose, Bld: 135 mg/dL — ABNORMAL HIGH (ref 70–99)
Phosphorus: 1.2 mg/dL — ABNORMAL LOW (ref 2.5–4.6)
Potassium: 3.1 mmol/L — ABNORMAL LOW (ref 3.5–5.1)
Sodium: 136 mmol/L (ref 135–145)

## 2023-03-29 MED ORDER — HEPARIN SODIUM (PORCINE) 1000 UNIT/ML DIALYSIS
2000.0000 [IU] | Freq: Once | INTRAMUSCULAR | Status: AC
  Administered 2023-03-29: 2000 [IU] via INTRAVENOUS_CENTRAL
  Filled 2023-03-29: qty 2

## 2023-03-29 MED ORDER — HYDRALAZINE HCL 10 MG PO TABS: 10.0000 mg | ORAL_TABLET | Freq: Three times a day (TID) | ORAL | 0 refills | Status: DC

## 2023-03-29 MED ORDER — ALTEPLASE 2 MG IJ SOLR: 2.0000 mg | Freq: Once | INTRAMUSCULAR | Status: DC | PRN

## 2023-03-29 MED ORDER — HEPARIN SODIUM (PORCINE) 1000 UNIT/ML DIALYSIS: 1000.0000 [IU] | INTRAMUSCULAR | Status: DC | PRN

## 2023-03-29 MED ORDER — LIDOCAINE HCL (PF) 1 % IJ SOLN: 5.0000 mL | INTRAMUSCULAR | Status: DC | PRN

## 2023-03-29 MED ORDER — LIDOCAINE-PRILOCAINE 2.5-2.5 % EX CREA: 1.0000 | TOPICAL_CREAM | CUTANEOUS | Status: DC | PRN

## 2023-03-29 MED ORDER — PENTAFLUOROPROP-TETRAFLUOROETH EX AERO: 1.0000 | INHALATION_SPRAY | CUTANEOUS | Status: DC | PRN

## 2023-03-29 MED ORDER — ADULT MULTIVITAMIN W/MINERALS CH: 1.0000 | ORAL_TABLET | Freq: Every day | ORAL | 0 refills | Status: DC

## 2023-03-29 MED ORDER — DARBEPOETIN ALFA 100 MCG/0.5ML IJ SOSY
100.0000 ug | PREFILLED_SYRINGE | INTRAMUSCULAR | Status: DC
  Filled 2023-03-29: qty 0.5

## 2023-03-29 MED ORDER — LEVOTHYROXINE SODIUM 25 MCG PO TABS: 25.0000 ug | ORAL_TABLET | Freq: Every day | ORAL | 0 refills | Status: DC

## 2023-03-29 MED ORDER — HEPARIN SODIUM (PORCINE) 1000 UNIT/ML IJ SOLN
3200.0000 [IU] | Freq: Once | INTRAMUSCULAR | Status: AC
  Administered 2023-03-29: 3200 [IU]
  Filled 2023-03-29: qty 4

## 2023-03-29 MED ORDER — POLYETHYLENE GLYCOL 3350 17 G PO PACK: 17.0000 g | PACK | Freq: Every day | ORAL | 0 refills | Status: DC

## 2023-03-29 MED ORDER — AMLODIPINE BESYLATE 10 MG PO TABS: 10.0000 mg | ORAL_TABLET | Freq: Every morning | ORAL | 0 refills | Status: AC

## 2023-03-29 MED ORDER — VANCOMYCIN IV (FOR PTA / DISCHARGE USE ONLY): 750.0000 mg | INTRAVENOUS | Status: AC

## 2023-03-29 MED ORDER — ANTICOAGULANT SODIUM CITRATE 4% (200MG/5ML) IV SOLN: 5.0000 mL | Status: DC | PRN

## 2023-03-29 NOTE — TOC Transition Note (Signed)
Transition of Care [facility]) - CM/SW Discharge Note   Patient Details  Name: [name] [name] MRN: [id number] Date of Birth: 1961/05/11  Transition of Care [facility]) CM/SW Contact:  Mearl Latin, LCSW Phone Number: 03/29/2023, 1:46 PM   Clinical Narrative:    Insurance approval for [facility], Ref# [id number], Auth ID# [id number], effective 03/29/2023-03/31/2023.    Final next level of care: Skilled Nursing Facility Barriers to Discharge: Barriers Resolved   Patient Goals and CMS Choice      Discharge Placement     Existing PASRR number confirmed : 03/29/23          Patient chooses bed at: [facility] Patient to be transferred to facility by: [facility] Name of family member notified: mother Patient and family notified of of transfer: 03/29/23  Discharge Plan and Services Additional resources added to the After Visit Summary for   In-house Referral: Clinical Social Work                                   Social Determinants of Health (SDOH) Interventions SDOH Screenings   Food Insecurity: No Food Insecurity (03/06/2023)  Housing: Low Risk  (03/06/2023)  Transportation Needs: No Transportation Needs (03/06/2023)  Utilities: Not At Risk (03/06/2023)  Depression (PHQ2-9): Low Risk  (09/18/2018)  Tobacco Use: Low Risk  (03/25/2023)     Readmission Risk Interventions    03/29/2023    1:45 PM 03/07/2023    1:31 PM  Readmission Risk Prevention Plan  Transportation Screening Complete Complete  HRI or Home Care Consult  Complete  Palliative Care Screening  Not Applicable  Medication Review (RN Care Manager) Complete Complete  PCP or Specialist appointment within 3-5 days of discharge Complete   HRI or Home Care Consult Complete   SW Recovery Care/Counseling Consult Complete   Palliative Care Screening Complete   Skilled Nursing Facility Complete

## 2023-03-29 NOTE — Progress Notes (Signed)
Nutrition Follow-up  DOCUMENTATION CODES:   Severe malnutrition in context of chronic illness  INTERVENTION:  Nepro TID Snack TID   NUTRITION DIAGNOSIS:   Severe Malnutrition related to chronic illness as evidenced by severe muscle depletion, severe fat depletion.    GOAL:   Patient will meet greater than or equal to 90% of their needs    MONITOR:   PO intake, Supplement acceptance, Labs, Weight trends  REASON FOR ASSESSMENT:   Malnutrition Screening Tool    ASSESSMENT:   62 yo male admitted with acute metabolic encephalopathy d/t UTI and COVID PNA. PMH includes ESRD on HD M-W-F, HTN, DM-2, pulmonary HTN, cardiomyopathy, GERD, HLD, anemia of CKD. Pt is scheduled to discharge to SNF 75-100% average intake of most meals.   Weight history during stay: 03/29/23 1318 54.8 kg 120.81 lbs  03/29/23 0843 54.8 kg 120.81 lbs  03/27/23 0924 61.6 kg 135.8 lbs  03/26/23 19:57:47 60.6 kg 133.6 lbs  03/25/23 1604 57.3 kg 126.32 lbs  03/25/23 1251 58.9 kg 129.85 lbs  03/25/23 0728 60.9 kg 134.26 lbs  03/17/23 1621 60.9 kg 134.26 lbs  03/17/23 1253 62.9 kg 138.67 lbs  03/10/23 1330 62 kg 136.69 lbs  03/10/23 0945 63.1 kg 139.11 lbs  03/07/23 1600 63.1 kg 139.11 lbs  03/07/23 1140 63.8 kg 140.65 lbs  03/06/23 0947 78 kg 172 lbs   NUTRITION - FOCUSED PHYSICAL EXAM:  Flowsheet Row Most Recent Value  Orbital Region Moderate depletion  Upper Arm Region Severe depletion  Thoracic and Lumbar Region Moderate depletion  Buccal Region Severe depletion  Temple Region Moderate depletion  Clavicle Bone Region Severe depletion  Clavicle and Acromion Bone Region Severe depletion  Scapular Bone Region Unable to assess  Dorsal Hand Moderate depletion  Patellar Region Severe depletion  Anterior Thigh Region Severe depletion  Posterior Calf Region Severe depletion  Edema (RD Assessment) None  Hair Reviewed  Eyes Reviewed  Mouth Reviewed  Skin Reviewed  Nails Reviewed        Diet Order:   Diet Order             Diet - low sodium heart healthy           Diet renal with fluid restriction Fluid restriction: 1200 mL Fluid; Room service appropriate? Yes; Fluid consistency: Thin  Diet effective now                   EDUCATION NEEDS:   Not appropriate for education at this time  Skin:  Skin Assessment: Reviewed RN Assessment (pretibial skin tear x 2)  Last BM:  9/22  Height:   Ht Readings from Last 1 Encounters:  03/25/23 5\' 10"  (1.778 m)    Weight:   Wt Readings from Last 1 Encounters:  03/29/23 54.8 kg    Ideal Body Weight:  75.5 kg  BMI:  Body mass index is 17.33 kg/m.  Estimated Nutritional Needs:   Kcal:  1900-2200  Protein:  85-100 gm  Fluid:  1 L + UOP    Jamelle Haring RDN, LDN Clinical Dietitian  RDN pager # available on Amion

## 2023-03-29 NOTE — Progress Notes (Signed)
Received patient in bed to unit.  Alert and oriented.  Informed consent signed and in chart.   TX duration:3.5 hours Patient BP was low during beginning of treament, PA Stovall informed and PA Stovall gave the ok to leave fluid removal off.   Patient tolerated well.  Transported back to the room  Alert, without acute distress.  Hand-off given to patient's nurse.   Access used: R HD Cath Access issues: A-v and V-A  Total UF removed: 0 Medication(s) given: Dilaudid for pain and VANC, see MAR   03/29/23 1254  Vitals  Temp 97.9 F (36.6 C)  Temp Source Oral  BP 113/72  MAP (mmHg) 85  Pulse Rate 80  ECG Heart Rate 81  Resp 12  Oxygen Therapy  SpO2 98 %  O2 Device Room Air  During Treatment Monitoring  Duration of HD Treatment -hour(s) 3.5 hour(s)  HD Safety Checks Performed Yes  Intra-Hemodialysis Comments Tx completed;Tolerated well  Dialysis Fluid Bolus Normal Saline  Bolus Amount (mL) 300 mL     Stacie Glaze LPN [facility]

## 2023-03-29 NOTE — Discharge Planning (Signed)
[facility] Patient Discharge Orders - [facility]: [place]  Patient's name: [name] [name] Admit/DC Dates: 03/06/2023 - 03/29/2023  DISCHARGE DIAGNOSES: MRSA + Enterococcus bacteremia  MV endocarditis AMS/baseline dementia COVID (Dx 9/23, finished isolation window, s/p remdesivir) Debility/frailty  HD ORDER CHANGES: Heparin change: no EDW Change: yes New EDW: 55kg Bath Change: no -> continue 3K bath with weekly K checks  ANEMIA MANAGEMENT: Aranesp: Given: yes   Amount/Date of last dose: on 10/11 ESA dose for discharge: mircera 150 mcg IV q 2 weeks, to start on 10/18 IV Iron dose at discharge: per protocol Transfusion: Given: no  BONE/MINERAL MEDICATIONS: Hectorol/Calcitriol change: no Sensipar/Parsabiv change: no  ACCESS INTERVENTION/CHANGE: 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 New (current) TDC placed 10/12 by [facility]  RECENT LABS: Recent Labs  Lab 03/29/23 0900  HGB 8.7*  NA 136  K 3.1*  CALCIUM 8.4*  PHOS 1.2*  ALBUMIN 2.2*    IV ANTIBIOTICS: YES - VANCOMYCIN 750MG  IV Q HD UNTIL 05/02/2023 Details:  OTHER ANTICOAGULATION: On Coumadin?: no  OTHER/APPTS/LAB ORDERS:  - Check Vanc level weekly  - L arm staples are supposed to be removed around 10/18 -- make sure that has been done  - D/c to [facility] SNF - no TCM visit needed   D/C Meds to be reconciled by nurse after every discharge.  Completed By:   Reviewed by: MD:______ RN_______

## 2023-03-29 NOTE — TOC Progression Note (Signed)
Transition of Care [facility]) - Progression Note    Patient Details  Name: [name] [name] MRN: [id number] Date of Birth: 01/30/1961  Transition of Care [facility]) CM/SW Contact  Marliss Coots, LCSW Phone Number: 03/29/2023, 2:04 PM  Clinical Narrative:     This CSW called patient's mother, [name] [name], to notify that the patient will be discharging to SNF [facility]) today via amulance ([facility]). Doristine Counter expressed understanding of this and inquired about patient's belongings (clothes, license, glasses, pills). This CSW notified patient's bedside of RN of the inquiry, and the RN stated that they would call Doristine Counter to answer questions about belongings. Doristine Counter reported to this CSW that her home phone is [phone].  Expected Discharge Plan: Skilled Nursing Facility Barriers to Discharge: Barriers Resolved  Expected Discharge Plan and Services In-house Referral: Clinical Social Work     Living arrangements for the past 2 months: Single Family Home Expected Discharge Date: 03/29/23                                     Social Determinants of Health (SDOH) Interventions SDOH Screenings   Food Insecurity: No Food Insecurity (03/06/2023)  Housing: Low Risk  (03/06/2023)  Transportation Needs: No Transportation Needs (03/06/2023)  Utilities: Not At Risk (03/06/2023)  Depression (PHQ2-9): Low Risk  (09/18/2018)  Tobacco Use: Low Risk  (03/25/2023)    Readmission Risk Interventions    03/29/2023    1:45 PM 03/07/2023    1:31 PM  Readmission Risk Prevention Plan  Transportation Screening Complete Complete  HRI or Home Care Consult  Complete  Palliative Care Screening  Not Applicable  Medication Review (RN Care Manager) Complete Complete  PCP or Specialist appointment within 3-5 days of discharge Complete   HRI or Home Care Consult Complete   SW Recovery Care/Counseling Consult Complete   Palliative Care Screening Complete   Skilled Nursing Facility Complete

## 2023-03-29 NOTE — TOC Transition Note (Signed)
Transition of Care [facility]) - CM/SW Discharge Note   Patient Details  Name: [name] [name] MRN: [id number] Date of Birth: 08-06-1960  Transition of Care [facility]) CM/SW Contact:  Mearl Latin, LCSW Phone Number: 03/29/2023, 2:42 PM   Clinical Narrative:    Patient will DC to: [facility] Anticipated DC date: 03/29/23 Family notified: Mother Transport by: Sharin Mons   Per MD patient ready for DC to [facility]. RN to call report prior to discharge [phone] room [phone]). RN, patient, patient's family, and facility notified of DC. Discharge Summary and FL2 sent to facility. DC packet on chart. Ambulance transport requested for patient.   CSW will sign off for now as social work intervention is no longer needed. Please consult [place] again if new needs arise.     Final next level of care: Skilled Nursing Facility Barriers to Discharge: Barriers Resolved   Patient Goals and CMS Choice      Discharge Placement     Existing PASRR number confirmed : 03/29/23          Patient [facility] bed at: [facility] Patient to be transferred to facility by: [facility] Name of family member notified: mother Patient and family notified of of transfer: 03/29/23  Discharge Plan and Services Additional resources added to the After Visit Summary for   In-house Referral: Clinical Social Work                                   Social Determinants of Health (SDOH) Interventions SDOH Screenings   Food Insecurity: No Food Insecurity (03/06/2023)  Housing: Low Risk  (03/06/2023)  Transportation Needs: No Transportation Needs (03/06/2023)  Utilities: Not At Risk (03/06/2023)  Depression (PHQ2-9): Low Risk  (09/18/2018)  Tobacco Use: Low Risk  (03/25/2023)     Readmission Risk Interventions    03/29/2023    1:45 PM 03/07/2023    1:31 PM  Readmission Risk Prevention Plan  Transportation Screening Complete Complete  HRI or Home Care Consult  Complete  Palliative Care Screening  Not Applicable   Medication Review (RN Care Manager) Complete Complete  PCP or Specialist appointment within 3-5 days of discharge Complete   HRI or Home Care Consult Complete   SW Recovery Care/Counseling Consult Complete   Palliative Care Screening Complete   Skilled Nursing Facility Complete

## 2023-03-29 NOTE — Discharge Summary (Signed)
volumes are low. Asymmetric elevation of right hemidiaphragm. No superimposed pleural effusion, interstitial edema or airspace disease. IMPRESSION: Right-sided dialysis catheter with tips in the right atrium. No pneumothorax. Electronically Signed   By: Signa Kell M.D.   On: 03/20/2023 13:00   EP STUDY  Result Date: 03/20/2023 See surgical note for result.  DG Chest  Port 1 View  Result Date: 03/15/2023 CLINICAL DATA:  Post dialysis catheter insertion EXAM: PORTABLE CHEST 1 VIEW COMPARISON:  03/06/2023 FINDINGS: Shallow inspiration. A right central venous catheter has been placed with tip projecting over the right atrium. No pneumothorax. Heart size is normal. Lungs are clear. No pleural effusions. No pneumothorax. Skin clips over the left axilla. Degenerative changes in the spine and shoulders. IMPRESSION: Right central venous catheter placed with tip projecting over the right atrium. No pneumothorax. Lungs are clear. Electronically Signed   By: Burman Nieves M.D.   On: 03/15/2023 21:22   PERIPHERAL VASCULAR CATHETERIZATION  Result Date: 03/15/2023 See surgical note for result.  HYBRID OR IMAGING ([facility] ONLY)  Result Date: 03/15/2023 There is no interpretation for this exam.  This order is for images obtained during a surgical procedure.  Please See "Surgeries" Tab for more information regarding the procedure.   ECHOCARDIOGRAM COMPLETE  Result Date: 03/10/2023    ECHOCARDIOGRAM REPORT   Patient Name:   [name] [name] Date of Exam: 03/10/2023 Medical Rec #:  [id number]     Height:       70.0 in Accession #:    [id number]    Weight:       139.1 lb Date of Birth:  1961/04/27     BSA:          1.789 m Patient Age:    62 years      BP:           115/67 mmHg Patient Gender: M             HR:           83 bpm. Exam Location:  Inpatient Procedure: 2D Echo, Cardiac Doppler and Color Doppler Indications:    Endocarditis  History:        Patient has prior history of Echocardiogram examinations, most                 recent 12/25/2020. Cardiomyopathy, Pulmonary HTN; Risk                 Factors:Dyslipidemia, Hypertension and Diabetes.  Sonographer:    Milda Smart Referring Phys: [id number] [facility]  Sonographer Comments: Image acquisition challenging due to respiratory motion. IMPRESSIONS  1. Left ventricular ejection fraction, by estimation, is 55 to 60%. The left ventricle  has normal function. The left ventricle has no regional wall motion abnormalities. Left ventricular diastolic parameters are consistent with Grade I diastolic dysfunction (impaired relaxation).  2. Right ventricular systolic function is normal. The right ventricular size is normal.  3. Anterior and posterior mitral valve leaflets are thickened compared with echo 12/2020. Concerning for endocarditis. The mitral valve is abnormal. No evidence of mitral valve regurgitation. No evidence of mitral stenosis.  4. The aortic valve is calcified and thickened. There is a mobile density on the left coronary cusp that was not present in the echo 12/2020. Consistent with endocarditis. The aortic valve is abnormal. There is mild calcification of the aortic valve. There is moderate thickening of the aortic valve. Aortic valve regurgitation is not visualized. No aortic stenosis is present.  5. The inferior  Physician Discharge Summary   Patient: [name] [name] MRN: [id number] DOB: 06-01-1961  Admit date:     03/06/2023  Discharge date: 03/29/23  Discharge Physician: Alba Cory   PCP: Farris Has, MD   Recommendations at discharge:    Needs to follow up with ID after completion of Vancomycin.  Needs Vancomycin with HD until 11/19 Needs to follow up with Vascular for further vascular access.   Discharge Diagnoses: Principal Problem:   Endocarditis of mitral valve Active Problems:   Essential hypertension   Neuropathy   Anemia of chronic disease   Hypokalemia   Hypothyroidism   GERD (gastroesophageal reflux disease)   ESRD (end stage renal disease) (HCC)   Acute metabolic encephalopathy   Protein-calorie malnutrition, severe   MRSA bacteremia   Enterococcal bacteremia   Hemodialysis catheter infection (HCC)   Delirium   Infection of AV graft for dialysis [facility])  Resolved Problems:   * No resolved hospital problems. *  Hospital Course: 62 year old with past medical history significant for ESRD on hemodialysis MWF, hypertension, diabetes type 2, pulmonary hypertension, cardiomyopathy, GERD, dyslipidemia, anemia of CKD presents to the ED with worsening mental status.  He was admitted with acute metabolic encephalopathy secondary to UTI and COVID-pneumonia and also missed hemodialysis sections.  He was found to have MRSA and enterococcal bacteremia. He was started on Vancomycin.  ID has been following.  Patient was transferred to [facility] for graft removal.   He underwent graft removal.  Subsequently had TEE which showed mitral valve endocarditis and vegetation on TDC.  He had holiday line.  Subsequently had tunneled dialysis catheter placed on 10/12 by vascular.  Awaiting skilled nursing facility for rehab      Assessment and Plan: MRSA and Enterococcus bacteremia, Mitral Valve Endocarditis, vegetation on [facility] -Currently afebrile with no leukocytosis -Concern for  infection in the AV graft s/p excision of graft 10/2 -TEE 10/07: Small linear 2.3 mm mobile vegetation attached to the lateral commissure of the P1 segment of the mitral valve consistent with vegetation.  Dialysis catheter tip in the right atrium with 6.4 mm x 2.8 mm mobile mass consistent with vegetation -ID consulted, s/p holiday line after removal on 10/8, replaced TDC on 10/12 by vascular after BC remained NGTD -Continue IV vancomycin. Needs Vancomycin 10/08 until 11/19. Needs to follow up with ID   COVID infection Tested positive on 9/23 Completed course of Remdesivir, steroid Off isolation since 10/03   ESRD on hemodialysis MWF Managed per nephrology Underwent excision of AV fistula due to infection Temporary HD catheter with vegetation, ID consulted, s/p holiday line after removal on 10/8, replaced TDC on 10/12 by vascular after BC remained NGTD   Acute metabolic encephalopathy Improved In the setting of UTI/COVID, missed hemodialysis and bacteremia Likely has some cognitive impairment at baseline   Anemia of chronic kidney disease Stable   Hypertension Continue with metoprolol and amlodipine, added hydralazine (refusing meds)   Diabetes type 2 Continue with sliding scale insulin   Hypothyroidism Continue with Synthroid   GERD Continue with PPI   Thrombocytopenia Improving   Hypoalbuminemia On supplements            Consultants: ID, Vascular, nephrology  Procedures performed: HD, TEE, THD Disposition: Skilled nursing facility Diet recommendation:  Discharge Diet Orders (From admission, onward)     Start     Ordered   03/29/23 0000  Diet - low sodium heart healthy        03/29/23 1316  Physician Discharge Summary   Patient: [name] [name] MRN: [id number] DOB: 06-01-1961  Admit date:     03/06/2023  Discharge date: 03/29/23  Discharge Physician: Alba Cory   PCP: Farris Has, MD   Recommendations at discharge:    Needs to follow up with ID after completion of Vancomycin.  Needs Vancomycin with HD until 11/19 Needs to follow up with Vascular for further vascular access.   Discharge Diagnoses: Principal Problem:   Endocarditis of mitral valve Active Problems:   Essential hypertension   Neuropathy   Anemia of chronic disease   Hypokalemia   Hypothyroidism   GERD (gastroesophageal reflux disease)   ESRD (end stage renal disease) (HCC)   Acute metabolic encephalopathy   Protein-calorie malnutrition, severe   MRSA bacteremia   Enterococcal bacteremia   Hemodialysis catheter infection (HCC)   Delirium   Infection of AV graft for dialysis [facility])  Resolved Problems:   * No resolved hospital problems. *  Hospital Course: 62 year old with past medical history significant for ESRD on hemodialysis MWF, hypertension, diabetes type 2, pulmonary hypertension, cardiomyopathy, GERD, dyslipidemia, anemia of CKD presents to the ED with worsening mental status.  He was admitted with acute metabolic encephalopathy secondary to UTI and COVID-pneumonia and also missed hemodialysis sections.  He was found to have MRSA and enterococcal bacteremia. He was started on Vancomycin.  ID has been following.  Patient was transferred to [facility] for graft removal.   He underwent graft removal.  Subsequently had TEE which showed mitral valve endocarditis and vegetation on TDC.  He had holiday line.  Subsequently had tunneled dialysis catheter placed on 10/12 by vascular.  Awaiting skilled nursing facility for rehab      Assessment and Plan: MRSA and Enterococcus bacteremia, Mitral Valve Endocarditis, vegetation on [facility] -Currently afebrile with no leukocytosis -Concern for  infection in the AV graft s/p excision of graft 10/2 -TEE 10/07: Small linear 2.3 mm mobile vegetation attached to the lateral commissure of the P1 segment of the mitral valve consistent with vegetation.  Dialysis catheter tip in the right atrium with 6.4 mm x 2.8 mm mobile mass consistent with vegetation -ID consulted, s/p holiday line after removal on 10/8, replaced TDC on 10/12 by vascular after BC remained NGTD -Continue IV vancomycin. Needs Vancomycin 10/08 until 11/19. Needs to follow up with ID   COVID infection Tested positive on 9/23 Completed course of Remdesivir, steroid Off isolation since 10/03   ESRD on hemodialysis MWF Managed per nephrology Underwent excision of AV fistula due to infection Temporary HD catheter with vegetation, ID consulted, s/p holiday line after removal on 10/8, replaced TDC on 10/12 by vascular after BC remained NGTD   Acute metabolic encephalopathy Improved In the setting of UTI/COVID, missed hemodialysis and bacteremia Likely has some cognitive impairment at baseline   Anemia of chronic kidney disease Stable   Hypertension Continue with metoprolol and amlodipine, added hydralazine (refusing meds)   Diabetes type 2 Continue with sliding scale insulin   Hypothyroidism Continue with Synthroid   GERD Continue with PPI   Thrombocytopenia Improving   Hypoalbuminemia On supplements            Consultants: ID, Vascular, nephrology  Procedures performed: HD, TEE, THD Disposition: Skilled nursing facility Diet recommendation:  Discharge Diet Orders (From admission, onward)     Start     Ordered   03/29/23 0000  Diet - low sodium heart healthy        03/29/23 1316  Physician Discharge Summary   Patient: [name] [name] MRN: [id number] DOB: 06-01-1961  Admit date:     03/06/2023  Discharge date: 03/29/23  Discharge Physician: Alba Cory   PCP: Farris Has, MD   Recommendations at discharge:    Needs to follow up with ID after completion of Vancomycin.  Needs Vancomycin with HD until 11/19 Needs to follow up with Vascular for further vascular access.   Discharge Diagnoses: Principal Problem:   Endocarditis of mitral valve Active Problems:   Essential hypertension   Neuropathy   Anemia of chronic disease   Hypokalemia   Hypothyroidism   GERD (gastroesophageal reflux disease)   ESRD (end stage renal disease) (HCC)   Acute metabolic encephalopathy   Protein-calorie malnutrition, severe   MRSA bacteremia   Enterococcal bacteremia   Hemodialysis catheter infection (HCC)   Delirium   Infection of AV graft for dialysis [facility])  Resolved Problems:   * No resolved hospital problems. *  Hospital Course: 62 year old with past medical history significant for ESRD on hemodialysis MWF, hypertension, diabetes type 2, pulmonary hypertension, cardiomyopathy, GERD, dyslipidemia, anemia of CKD presents to the ED with worsening mental status.  He was admitted with acute metabolic encephalopathy secondary to UTI and COVID-pneumonia and also missed hemodialysis sections.  He was found to have MRSA and enterococcal bacteremia. He was started on Vancomycin.  ID has been following.  Patient was transferred to [facility] for graft removal.   He underwent graft removal.  Subsequently had TEE which showed mitral valve endocarditis and vegetation on TDC.  He had holiday line.  Subsequently had tunneled dialysis catheter placed on 10/12 by vascular.  Awaiting skilled nursing facility for rehab      Assessment and Plan: MRSA and Enterococcus bacteremia, Mitral Valve Endocarditis, vegetation on [facility] -Currently afebrile with no leukocytosis -Concern for  infection in the AV graft s/p excision of graft 10/2 -TEE 10/07: Small linear 2.3 mm mobile vegetation attached to the lateral commissure of the P1 segment of the mitral valve consistent with vegetation.  Dialysis catheter tip in the right atrium with 6.4 mm x 2.8 mm mobile mass consistent with vegetation -ID consulted, s/p holiday line after removal on 10/8, replaced TDC on 10/12 by vascular after BC remained NGTD -Continue IV vancomycin. Needs Vancomycin 10/08 until 11/19. Needs to follow up with ID   COVID infection Tested positive on 9/23 Completed course of Remdesivir, steroid Off isolation since 10/03   ESRD on hemodialysis MWF Managed per nephrology Underwent excision of AV fistula due to infection Temporary HD catheter with vegetation, ID consulted, s/p holiday line after removal on 10/8, replaced TDC on 10/12 by vascular after BC remained NGTD   Acute metabolic encephalopathy Improved In the setting of UTI/COVID, missed hemodialysis and bacteremia Likely has some cognitive impairment at baseline   Anemia of chronic kidney disease Stable   Hypertension Continue with metoprolol and amlodipine, added hydralazine (refusing meds)   Diabetes type 2 Continue with sliding scale insulin   Hypothyroidism Continue with Synthroid   GERD Continue with PPI   Thrombocytopenia Improving   Hypoalbuminemia On supplements            Consultants: ID, Vascular, nephrology  Procedures performed: HD, TEE, THD Disposition: Skilled nursing facility Diet recommendation:  Discharge Diet Orders (From admission, onward)     Start     Ordered   03/29/23 0000  Diet - low sodium heart healthy        03/29/23 1316  Filed Weights   03/26/23 1957 03/27/23 0924 03/29/23 0843  Weight: 60.6 kg 61.6 kg 54.8 kg   General; NAD Condition at discharge: stable  The results of significant diagnostics from this hospitalization (including imaging, microbiology, ancillary and laboratory) are listed below for reference.   Imaging Studies: DG CHEST PORT 1 VIEW  Result Date: 03/25/2023 CLINICAL DATA:  201257 ESRD (end stage renal disease) (HCC) 161096 EXAM: PORTABLE CHEST - 1 VIEW COMPARISON:  03/20/2023 FINDINGS: Tunneled right IJ hemodialysis catheter is been placed to the cavoatrial junction. No pneumothorax. Lungs clear. Heart size and mediastinal contours are within normal limits. Aortic Atherosclerosis (ICD10-170.0). No effusion. Surgical staples left axilla. IMPRESSION: No acute cardiopulmonary disease. Electronically Signed   By: Corlis Leak M.D.   On: 03/25/2023 13:35   DG C-Arm 1-60 Min  Result Date: 03/25/2023 CLINICAL DATA:  surgery EXAM: DG C-ARM 1-60 MIN COMPARISON:  None Available.  FINDINGS: Single fluoroscopic spot image documents tunneled right IJ catheter to the cavoatrial junction. No visualized pneumothorax. IMPRESSION: Right IJ catheter to cavoatrial junction. Electronically Signed   By: Corlis Leak M.D.   On: 03/25/2023 13:35   ECHO TEE  Result Date: 03/20/2023    TRANSESOPHOGEAL ECHO REPORT   Patient Name:   [name] [name] Date of Exam: 03/20/2023 Medical Rec #:  [id number]     Height:       70.0 in Accession #:    [id number]    Weight:       134.3 lb Date of Birth:  Jan 11, 1961     BSA:          1.762 m Patient Age:    62 years      BP:           176/92 mmHg Patient Gender: M             HR:           57 bpm. Exam Location:  Inpatient Procedure: 3D Echo, Cardiac Doppler, Color Doppler and Transesophageal Echo Indications:     PHTN and Endocarditis  History:         Patient has prior history of Echocardiogram examinations, most                  recent 03/09/2033. Cardiomyopathy, Pulmonary HTN; Risk                  Factors:Hypertension, Diabetes and Dyslipidemia.  Sonographer:     Harriette Bouillon RDCS Referring Phys:  [id number] RHYNE Diagnosing Phys: Lennie Odor MD PROCEDURE: The transesophogeal probe was passed without difficulty through the esophogus of the patient. Sedation performed by different physician. The patient developed no complications during the procedure.  IMPRESSIONS  1. Small 2.3 mm linear density noted on the lateral commissure (P1 segment) consistent with vegetation. The mitral valve is degenerative. Trivial mitral valve regurgitation. No evidence of mitral stenosis.  2. Intravenous catheter tip noted in the RA with 6.4 mm x 2.8 mm mobile density consistent with vegetation.  3. Left ventricular ejection fraction, by estimation, is 50 to 55%. The left ventricle has low normal function.  4. Right ventricular systolic function is normal. The right ventricular size is normal.  5. No left atrial/left atrial appendage thrombus was detected.  6. The aortic valve is  tricuspid. There is mild calcification of the aortic valve. There is mild thickening of the aortic valve. Aortic valve regurgitation is not visualized. Mild aortic valve stenosis.  7. There is Moderate (Grade III)  Physician Discharge Summary   Patient: [name] [name] MRN: [id number] DOB: 06-01-1961  Admit date:     03/06/2023  Discharge date: 03/29/23  Discharge Physician: Alba Cory   PCP: Farris Has, MD   Recommendations at discharge:    Needs to follow up with ID after completion of Vancomycin.  Needs Vancomycin with HD until 11/19 Needs to follow up with Vascular for further vascular access.   Discharge Diagnoses: Principal Problem:   Endocarditis of mitral valve Active Problems:   Essential hypertension   Neuropathy   Anemia of chronic disease   Hypokalemia   Hypothyroidism   GERD (gastroesophageal reflux disease)   ESRD (end stage renal disease) (HCC)   Acute metabolic encephalopathy   Protein-calorie malnutrition, severe   MRSA bacteremia   Enterococcal bacteremia   Hemodialysis catheter infection (HCC)   Delirium   Infection of AV graft for dialysis [facility])  Resolved Problems:   * No resolved hospital problems. *  Hospital Course: 62 year old with past medical history significant for ESRD on hemodialysis MWF, hypertension, diabetes type 2, pulmonary hypertension, cardiomyopathy, GERD, dyslipidemia, anemia of CKD presents to the ED with worsening mental status.  He was admitted with acute metabolic encephalopathy secondary to UTI and COVID-pneumonia and also missed hemodialysis sections.  He was found to have MRSA and enterococcal bacteremia. He was started on Vancomycin.  ID has been following.  Patient was transferred to [facility] for graft removal.   He underwent graft removal.  Subsequently had TEE which showed mitral valve endocarditis and vegetation on TDC.  He had holiday line.  Subsequently had tunneled dialysis catheter placed on 10/12 by vascular.  Awaiting skilled nursing facility for rehab      Assessment and Plan: MRSA and Enterococcus bacteremia, Mitral Valve Endocarditis, vegetation on [facility] -Currently afebrile with no leukocytosis -Concern for  infection in the AV graft s/p excision of graft 10/2 -TEE 10/07: Small linear 2.3 mm mobile vegetation attached to the lateral commissure of the P1 segment of the mitral valve consistent with vegetation.  Dialysis catheter tip in the right atrium with 6.4 mm x 2.8 mm mobile mass consistent with vegetation -ID consulted, s/p holiday line after removal on 10/8, replaced TDC on 10/12 by vascular after BC remained NGTD -Continue IV vancomycin. Needs Vancomycin 10/08 until 11/19. Needs to follow up with ID   COVID infection Tested positive on 9/23 Completed course of Remdesivir, steroid Off isolation since 10/03   ESRD on hemodialysis MWF Managed per nephrology Underwent excision of AV fistula due to infection Temporary HD catheter with vegetation, ID consulted, s/p holiday line after removal on 10/8, replaced TDC on 10/12 by vascular after BC remained NGTD   Acute metabolic encephalopathy Improved In the setting of UTI/COVID, missed hemodialysis and bacteremia Likely has some cognitive impairment at baseline   Anemia of chronic kidney disease Stable   Hypertension Continue with metoprolol and amlodipine, added hydralazine (refusing meds)   Diabetes type 2 Continue with sliding scale insulin   Hypothyroidism Continue with Synthroid   GERD Continue with PPI   Thrombocytopenia Improving   Hypoalbuminemia On supplements            Consultants: ID, Vascular, nephrology  Procedures performed: HD, TEE, THD Disposition: Skilled nursing facility Diet recommendation:  Discharge Diet Orders (From admission, onward)     Start     Ordered   03/29/23 0000  Diet - low sodium heart healthy        03/29/23 1316  Filed Weights   03/26/23 1957 03/27/23 0924 03/29/23 0843  Weight: 60.6 kg 61.6 kg 54.8 kg   General; NAD Condition at discharge: stable  The results of significant diagnostics from this hospitalization (including imaging, microbiology, ancillary and laboratory) are listed below for reference.   Imaging Studies: DG CHEST PORT 1 VIEW  Result Date: 03/25/2023 CLINICAL DATA:  201257 ESRD (end stage renal disease) (HCC) 161096 EXAM: PORTABLE CHEST - 1 VIEW COMPARISON:  03/20/2023 FINDINGS: Tunneled right IJ hemodialysis catheter is been placed to the cavoatrial junction. No pneumothorax. Lungs clear. Heart size and mediastinal contours are within normal limits. Aortic Atherosclerosis (ICD10-170.0). No effusion. Surgical staples left axilla. IMPRESSION: No acute cardiopulmonary disease. Electronically Signed   By: Corlis Leak M.D.   On: 03/25/2023 13:35   DG C-Arm 1-60 Min  Result Date: 03/25/2023 CLINICAL DATA:  surgery EXAM: DG C-ARM 1-60 MIN COMPARISON:  None Available.  FINDINGS: Single fluoroscopic spot image documents tunneled right IJ catheter to the cavoatrial junction. No visualized pneumothorax. IMPRESSION: Right IJ catheter to cavoatrial junction. Electronically Signed   By: Corlis Leak M.D.   On: 03/25/2023 13:35   ECHO TEE  Result Date: 03/20/2023    TRANSESOPHOGEAL ECHO REPORT   Patient Name:   [name] [name] Date of Exam: 03/20/2023 Medical Rec #:  [id number]     Height:       70.0 in Accession #:    [id number]    Weight:       134.3 lb Date of Birth:  Jan 11, 1961     BSA:          1.762 m Patient Age:    62 years      BP:           176/92 mmHg Patient Gender: M             HR:           57 bpm. Exam Location:  Inpatient Procedure: 3D Echo, Cardiac Doppler, Color Doppler and Transesophageal Echo Indications:     PHTN and Endocarditis  History:         Patient has prior history of Echocardiogram examinations, most                  recent 03/09/2033. Cardiomyopathy, Pulmonary HTN; Risk                  Factors:Hypertension, Diabetes and Dyslipidemia.  Sonographer:     Harriette Bouillon RDCS Referring Phys:  [id number] RHYNE Diagnosing Phys: Lennie Odor MD PROCEDURE: The transesophogeal probe was passed without difficulty through the esophogus of the patient. Sedation performed by different physician. The patient developed no complications during the procedure.  IMPRESSIONS  1. Small 2.3 mm linear density noted on the lateral commissure (P1 segment) consistent with vegetation. The mitral valve is degenerative. Trivial mitral valve regurgitation. No evidence of mitral stenosis.  2. Intravenous catheter tip noted in the RA with 6.4 mm x 2.8 mm mobile density consistent with vegetation.  3. Left ventricular ejection fraction, by estimation, is 50 to 55%. The left ventricle has low normal function.  4. Right ventricular systolic function is normal. The right ventricular size is normal.  5. No left atrial/left atrial appendage thrombus was detected.  6. The aortic valve is  tricuspid. There is mild calcification of the aortic valve. There is mild thickening of the aortic valve. Aortic valve regurgitation is not visualized. Mild aortic valve stenosis.  7. There is Moderate (Grade III)  Physician Discharge Summary   Patient: [name] [name] MRN: [id number] DOB: 06-01-1961  Admit date:     03/06/2023  Discharge date: 03/29/23  Discharge Physician: Alba Cory   PCP: Farris Has, MD   Recommendations at discharge:    Needs to follow up with ID after completion of Vancomycin.  Needs Vancomycin with HD until 11/19 Needs to follow up with Vascular for further vascular access.   Discharge Diagnoses: Principal Problem:   Endocarditis of mitral valve Active Problems:   Essential hypertension   Neuropathy   Anemia of chronic disease   Hypokalemia   Hypothyroidism   GERD (gastroesophageal reflux disease)   ESRD (end stage renal disease) (HCC)   Acute metabolic encephalopathy   Protein-calorie malnutrition, severe   MRSA bacteremia   Enterococcal bacteremia   Hemodialysis catheter infection (HCC)   Delirium   Infection of AV graft for dialysis [facility])  Resolved Problems:   * No resolved hospital problems. *  Hospital Course: 62 year old with past medical history significant for ESRD on hemodialysis MWF, hypertension, diabetes type 2, pulmonary hypertension, cardiomyopathy, GERD, dyslipidemia, anemia of CKD presents to the ED with worsening mental status.  He was admitted with acute metabolic encephalopathy secondary to UTI and COVID-pneumonia and also missed hemodialysis sections.  He was found to have MRSA and enterococcal bacteremia. He was started on Vancomycin.  ID has been following.  Patient was transferred to [facility] for graft removal.   He underwent graft removal.  Subsequently had TEE which showed mitral valve endocarditis and vegetation on TDC.  He had holiday line.  Subsequently had tunneled dialysis catheter placed on 10/12 by vascular.  Awaiting skilled nursing facility for rehab      Assessment and Plan: MRSA and Enterococcus bacteremia, Mitral Valve Endocarditis, vegetation on [facility] -Currently afebrile with no leukocytosis -Concern for  infection in the AV graft s/p excision of graft 10/2 -TEE 10/07: Small linear 2.3 mm mobile vegetation attached to the lateral commissure of the P1 segment of the mitral valve consistent with vegetation.  Dialysis catheter tip in the right atrium with 6.4 mm x 2.8 mm mobile mass consistent with vegetation -ID consulted, s/p holiday line after removal on 10/8, replaced TDC on 10/12 by vascular after BC remained NGTD -Continue IV vancomycin. Needs Vancomycin 10/08 until 11/19. Needs to follow up with ID   COVID infection Tested positive on 9/23 Completed course of Remdesivir, steroid Off isolation since 10/03   ESRD on hemodialysis MWF Managed per nephrology Underwent excision of AV fistula due to infection Temporary HD catheter with vegetation, ID consulted, s/p holiday line after removal on 10/8, replaced TDC on 10/12 by vascular after BC remained NGTD   Acute metabolic encephalopathy Improved In the setting of UTI/COVID, missed hemodialysis and bacteremia Likely has some cognitive impairment at baseline   Anemia of chronic kidney disease Stable   Hypertension Continue with metoprolol and amlodipine, added hydralazine (refusing meds)   Diabetes type 2 Continue with sliding scale insulin   Hypothyroidism Continue with Synthroid   GERD Continue with PPI   Thrombocytopenia Improving   Hypoalbuminemia On supplements            Consultants: ID, Vascular, nephrology  Procedures performed: HD, TEE, THD Disposition: Skilled nursing facility Diet recommendation:  Discharge Diet Orders (From admission, onward)     Start     Ordered   03/29/23 0000  Diet - low sodium heart healthy        03/29/23 1316  volumes are low. Asymmetric elevation of right hemidiaphragm. No superimposed pleural effusion, interstitial edema or airspace disease. IMPRESSION: Right-sided dialysis catheter with tips in the right atrium. No pneumothorax. Electronically Signed   By: Signa Kell M.D.   On: 03/20/2023 13:00   EP STUDY  Result Date: 03/20/2023 See surgical note for result.  DG Chest  Port 1 View  Result Date: 03/15/2023 CLINICAL DATA:  Post dialysis catheter insertion EXAM: PORTABLE CHEST 1 VIEW COMPARISON:  03/06/2023 FINDINGS: Shallow inspiration. A right central venous catheter has been placed with tip projecting over the right atrium. No pneumothorax. Heart size is normal. Lungs are clear. No pleural effusions. No pneumothorax. Skin clips over the left axilla. Degenerative changes in the spine and shoulders. IMPRESSION: Right central venous catheter placed with tip projecting over the right atrium. No pneumothorax. Lungs are clear. Electronically Signed   By: Burman Nieves M.D.   On: 03/15/2023 21:22   PERIPHERAL VASCULAR CATHETERIZATION  Result Date: 03/15/2023 See surgical note for result.  HYBRID OR IMAGING ([facility] ONLY)  Result Date: 03/15/2023 There is no interpretation for this exam.  This order is for images obtained during a surgical procedure.  Please See "Surgeries" Tab for more information regarding the procedure.   ECHOCARDIOGRAM COMPLETE  Result Date: 03/10/2023    ECHOCARDIOGRAM REPORT   Patient Name:   [name] [name] Date of Exam: 03/10/2023 Medical Rec #:  [id number]     Height:       70.0 in Accession #:    [id number]    Weight:       139.1 lb Date of Birth:  1961/04/27     BSA:          1.789 m Patient Age:    62 years      BP:           115/67 mmHg Patient Gender: M             HR:           83 bpm. Exam Location:  Inpatient Procedure: 2D Echo, Cardiac Doppler and Color Doppler Indications:    Endocarditis  History:        Patient has prior history of Echocardiogram examinations, most                 recent 12/25/2020. Cardiomyopathy, Pulmonary HTN; Risk                 Factors:Dyslipidemia, Hypertension and Diabetes.  Sonographer:    Milda Smart Referring Phys: [id number] [facility]  Sonographer Comments: Image acquisition challenging due to respiratory motion. IMPRESSIONS  1. Left ventricular ejection fraction, by estimation, is 55 to 60%. The left ventricle  has normal function. The left ventricle has no regional wall motion abnormalities. Left ventricular diastolic parameters are consistent with Grade I diastolic dysfunction (impaired relaxation).  2. Right ventricular systolic function is normal. The right ventricular size is normal.  3. Anterior and posterior mitral valve leaflets are thickened compared with echo 12/2020. Concerning for endocarditis. The mitral valve is abnormal. No evidence of mitral valve regurgitation. No evidence of mitral stenosis.  4. The aortic valve is calcified and thickened. There is a mobile density on the left coronary cusp that was not present in the echo 12/2020. Consistent with endocarditis. The aortic valve is abnormal. There is mild calcification of the aortic valve. There is moderate thickening of the aortic valve. Aortic valve regurgitation is not visualized. No aortic stenosis is present.  5. The inferior  Physician Discharge Summary   Patient: [name] [name] MRN: [id number] DOB: 06-01-1961  Admit date:     03/06/2023  Discharge date: 03/29/23  Discharge Physician: Alba Cory   PCP: Farris Has, MD   Recommendations at discharge:    Needs to follow up with ID after completion of Vancomycin.  Needs Vancomycin with HD until 11/19 Needs to follow up with Vascular for further vascular access.   Discharge Diagnoses: Principal Problem:   Endocarditis of mitral valve Active Problems:   Essential hypertension   Neuropathy   Anemia of chronic disease   Hypokalemia   Hypothyroidism   GERD (gastroesophageal reflux disease)   ESRD (end stage renal disease) (HCC)   Acute metabolic encephalopathy   Protein-calorie malnutrition, severe   MRSA bacteremia   Enterococcal bacteremia   Hemodialysis catheter infection (HCC)   Delirium   Infection of AV graft for dialysis [facility])  Resolved Problems:   * No resolved hospital problems. *  Hospital Course: 62 year old with past medical history significant for ESRD on hemodialysis MWF, hypertension, diabetes type 2, pulmonary hypertension, cardiomyopathy, GERD, dyslipidemia, anemia of CKD presents to the ED with worsening mental status.  He was admitted with acute metabolic encephalopathy secondary to UTI and COVID-pneumonia and also missed hemodialysis sections.  He was found to have MRSA and enterococcal bacteremia. He was started on Vancomycin.  ID has been following.  Patient was transferred to [facility] for graft removal.   He underwent graft removal.  Subsequently had TEE which showed mitral valve endocarditis and vegetation on TDC.  He had holiday line.  Subsequently had tunneled dialysis catheter placed on 10/12 by vascular.  Awaiting skilled nursing facility for rehab      Assessment and Plan: MRSA and Enterococcus bacteremia, Mitral Valve Endocarditis, vegetation on [facility] -Currently afebrile with no leukocytosis -Concern for  infection in the AV graft s/p excision of graft 10/2 -TEE 10/07: Small linear 2.3 mm mobile vegetation attached to the lateral commissure of the P1 segment of the mitral valve consistent with vegetation.  Dialysis catheter tip in the right atrium with 6.4 mm x 2.8 mm mobile mass consistent with vegetation -ID consulted, s/p holiday line after removal on 10/8, replaced TDC on 10/12 by vascular after BC remained NGTD -Continue IV vancomycin. Needs Vancomycin 10/08 until 11/19. Needs to follow up with ID   COVID infection Tested positive on 9/23 Completed course of Remdesivir, steroid Off isolation since 10/03   ESRD on hemodialysis MWF Managed per nephrology Underwent excision of AV fistula due to infection Temporary HD catheter with vegetation, ID consulted, s/p holiday line after removal on 10/8, replaced TDC on 10/12 by vascular after BC remained NGTD   Acute metabolic encephalopathy Improved In the setting of UTI/COVID, missed hemodialysis and bacteremia Likely has some cognitive impairment at baseline   Anemia of chronic kidney disease Stable   Hypertension Continue with metoprolol and amlodipine, added hydralazine (refusing meds)   Diabetes type 2 Continue with sliding scale insulin   Hypothyroidism Continue with Synthroid   GERD Continue with PPI   Thrombocytopenia Improving   Hypoalbuminemia On supplements            Consultants: ID, Vascular, nephrology  Procedures performed: HD, TEE, THD Disposition: Skilled nursing facility Diet recommendation:  Discharge Diet Orders (From admission, onward)     Start     Ordered   03/29/23 0000  Diet - low sodium heart healthy        03/29/23 1316  Filed Weights   03/26/23 1957 03/27/23 0924 03/29/23 0843  Weight: 60.6 kg 61.6 kg 54.8 kg   General; NAD Condition at discharge: stable  The results of significant diagnostics from this hospitalization (including imaging, microbiology, ancillary and laboratory) are listed below for reference.   Imaging Studies: DG CHEST PORT 1 VIEW  Result Date: 03/25/2023 CLINICAL DATA:  201257 ESRD (end stage renal disease) (HCC) 161096 EXAM: PORTABLE CHEST - 1 VIEW COMPARISON:  03/20/2023 FINDINGS: Tunneled right IJ hemodialysis catheter is been placed to the cavoatrial junction. No pneumothorax. Lungs clear. Heart size and mediastinal contours are within normal limits. Aortic Atherosclerosis (ICD10-170.0). No effusion. Surgical staples left axilla. IMPRESSION: No acute cardiopulmonary disease. Electronically Signed   By: Corlis Leak M.D.   On: 03/25/2023 13:35   DG C-Arm 1-60 Min  Result Date: 03/25/2023 CLINICAL DATA:  surgery EXAM: DG C-ARM 1-60 MIN COMPARISON:  None Available.  FINDINGS: Single fluoroscopic spot image documents tunneled right IJ catheter to the cavoatrial junction. No visualized pneumothorax. IMPRESSION: Right IJ catheter to cavoatrial junction. Electronically Signed   By: Corlis Leak M.D.   On: 03/25/2023 13:35   ECHO TEE  Result Date: 03/20/2023    TRANSESOPHOGEAL ECHO REPORT   Patient Name:   [name] [name] Date of Exam: 03/20/2023 Medical Rec #:  [id number]     Height:       70.0 in Accession #:    [id number]    Weight:       134.3 lb Date of Birth:  Jan 11, 1961     BSA:          1.762 m Patient Age:    62 years      BP:           176/92 mmHg Patient Gender: M             HR:           57 bpm. Exam Location:  Inpatient Procedure: 3D Echo, Cardiac Doppler, Color Doppler and Transesophageal Echo Indications:     PHTN and Endocarditis  History:         Patient has prior history of Echocardiogram examinations, most                  recent 03/09/2033. Cardiomyopathy, Pulmonary HTN; Risk                  Factors:Hypertension, Diabetes and Dyslipidemia.  Sonographer:     Harriette Bouillon RDCS Referring Phys:  [id number] RHYNE Diagnosing Phys: Lennie Odor MD PROCEDURE: The transesophogeal probe was passed without difficulty through the esophogus of the patient. Sedation performed by different physician. The patient developed no complications during the procedure.  IMPRESSIONS  1. Small 2.3 mm linear density noted on the lateral commissure (P1 segment) consistent with vegetation. The mitral valve is degenerative. Trivial mitral valve regurgitation. No evidence of mitral stenosis.  2. Intravenous catheter tip noted in the RA with 6.4 mm x 2.8 mm mobile density consistent with vegetation.  3. Left ventricular ejection fraction, by estimation, is 50 to 55%. The left ventricle has low normal function.  4. Right ventricular systolic function is normal. The right ventricular size is normal.  5. No left atrial/left atrial appendage thrombus was detected.  6. The aortic valve is  tricuspid. There is mild calcification of the aortic valve. There is mild thickening of the aortic valve. Aortic valve regurgitation is not visualized. Mild aortic valve stenosis.  7. There is Moderate (Grade III)  volumes are low. Asymmetric elevation of right hemidiaphragm. No superimposed pleural effusion, interstitial edema or airspace disease. IMPRESSION: Right-sided dialysis catheter with tips in the right atrium. No pneumothorax. Electronically Signed   By: Signa Kell M.D.   On: 03/20/2023 13:00   EP STUDY  Result Date: 03/20/2023 See surgical note for result.  DG Chest  Port 1 View  Result Date: 03/15/2023 CLINICAL DATA:  Post dialysis catheter insertion EXAM: PORTABLE CHEST 1 VIEW COMPARISON:  03/06/2023 FINDINGS: Shallow inspiration. A right central venous catheter has been placed with tip projecting over the right atrium. No pneumothorax. Heart size is normal. Lungs are clear. No pleural effusions. No pneumothorax. Skin clips over the left axilla. Degenerative changes in the spine and shoulders. IMPRESSION: Right central venous catheter placed with tip projecting over the right atrium. No pneumothorax. Lungs are clear. Electronically Signed   By: Burman Nieves M.D.   On: 03/15/2023 21:22   PERIPHERAL VASCULAR CATHETERIZATION  Result Date: 03/15/2023 See surgical note for result.  HYBRID OR IMAGING ([facility] ONLY)  Result Date: 03/15/2023 There is no interpretation for this exam.  This order is for images obtained during a surgical procedure.  Please See "Surgeries" Tab for more information regarding the procedure.   ECHOCARDIOGRAM COMPLETE  Result Date: 03/10/2023    ECHOCARDIOGRAM REPORT   Patient Name:   [name] [name] Date of Exam: 03/10/2023 Medical Rec #:  [id number]     Height:       70.0 in Accession #:    [id number]    Weight:       139.1 lb Date of Birth:  1961/04/27     BSA:          1.789 m Patient Age:    62 years      BP:           115/67 mmHg Patient Gender: M             HR:           83 bpm. Exam Location:  Inpatient Procedure: 2D Echo, Cardiac Doppler and Color Doppler Indications:    Endocarditis  History:        Patient has prior history of Echocardiogram examinations, most                 recent 12/25/2020. Cardiomyopathy, Pulmonary HTN; Risk                 Factors:Dyslipidemia, Hypertension and Diabetes.  Sonographer:    Milda Smart Referring Phys: [id number] [facility]  Sonographer Comments: Image acquisition challenging due to respiratory motion. IMPRESSIONS  1. Left ventricular ejection fraction, by estimation, is 55 to 60%. The left ventricle  has normal function. The left ventricle has no regional wall motion abnormalities. Left ventricular diastolic parameters are consistent with Grade I diastolic dysfunction (impaired relaxation).  2. Right ventricular systolic function is normal. The right ventricular size is normal.  3. Anterior and posterior mitral valve leaflets are thickened compared with echo 12/2020. Concerning for endocarditis. The mitral valve is abnormal. No evidence of mitral valve regurgitation. No evidence of mitral stenosis.  4. The aortic valve is calcified and thickened. There is a mobile density on the left coronary cusp that was not present in the echo 12/2020. Consistent with endocarditis. The aortic valve is abnormal. There is mild calcification of the aortic valve. There is moderate thickening of the aortic valve. Aortic valve regurgitation is not visualized. No aortic stenosis is present.  5. The inferior  Filed Weights   03/26/23 1957 03/27/23 0924 03/29/23 0843  Weight: 60.6 kg 61.6 kg 54.8 kg   General; NAD Condition at discharge: stable  The results of significant diagnostics from this hospitalization (including imaging, microbiology, ancillary and laboratory) are listed below for reference.   Imaging Studies: DG CHEST PORT 1 VIEW  Result Date: 03/25/2023 CLINICAL DATA:  201257 ESRD (end stage renal disease) (HCC) 161096 EXAM: PORTABLE CHEST - 1 VIEW COMPARISON:  03/20/2023 FINDINGS: Tunneled right IJ hemodialysis catheter is been placed to the cavoatrial junction. No pneumothorax. Lungs clear. Heart size and mediastinal contours are within normal limits. Aortic Atherosclerosis (ICD10-170.0). No effusion. Surgical staples left axilla. IMPRESSION: No acute cardiopulmonary disease. Electronically Signed   By: Corlis Leak M.D.   On: 03/25/2023 13:35   DG C-Arm 1-60 Min  Result Date: 03/25/2023 CLINICAL DATA:  surgery EXAM: DG C-ARM 1-60 MIN COMPARISON:  None Available.  FINDINGS: Single fluoroscopic spot image documents tunneled right IJ catheter to the cavoatrial junction. No visualized pneumothorax. IMPRESSION: Right IJ catheter to cavoatrial junction. Electronically Signed   By: Corlis Leak M.D.   On: 03/25/2023 13:35   ECHO TEE  Result Date: 03/20/2023    TRANSESOPHOGEAL ECHO REPORT   Patient Name:   [name] [name] Date of Exam: 03/20/2023 Medical Rec #:  [id number]     Height:       70.0 in Accession #:    [id number]    Weight:       134.3 lb Date of Birth:  Jan 11, 1961     BSA:          1.762 m Patient Age:    62 years      BP:           176/92 mmHg Patient Gender: M             HR:           57 bpm. Exam Location:  Inpatient Procedure: 3D Echo, Cardiac Doppler, Color Doppler and Transesophageal Echo Indications:     PHTN and Endocarditis  History:         Patient has prior history of Echocardiogram examinations, most                  recent 03/09/2033. Cardiomyopathy, Pulmonary HTN; Risk                  Factors:Hypertension, Diabetes and Dyslipidemia.  Sonographer:     Harriette Bouillon RDCS Referring Phys:  [id number] RHYNE Diagnosing Phys: Lennie Odor MD PROCEDURE: The transesophogeal probe was passed without difficulty through the esophogus of the patient. Sedation performed by different physician. The patient developed no complications during the procedure.  IMPRESSIONS  1. Small 2.3 mm linear density noted on the lateral commissure (P1 segment) consistent with vegetation. The mitral valve is degenerative. Trivial mitral valve regurgitation. No evidence of mitral stenosis.  2. Intravenous catheter tip noted in the RA with 6.4 mm x 2.8 mm mobile density consistent with vegetation.  3. Left ventricular ejection fraction, by estimation, is 50 to 55%. The left ventricle has low normal function.  4. Right ventricular systolic function is normal. The right ventricular size is normal.  5. No left atrial/left atrial appendage thrombus was detected.  6. The aortic valve is  tricuspid. There is mild calcification of the aortic valve. There is mild thickening of the aortic valve. Aortic valve regurgitation is not visualized. Mild aortic valve stenosis.  7. There is Moderate (Grade III)

## 2023-03-29 NOTE — Progress Notes (Signed)
Discharge orders received. AVS printed and placed in discharge packet. IV removed. Report given to [place] from [facility].

## 2023-03-29 NOTE — Progress Notes (Signed)
Physical Therapy Treatment Patient Details Name: [name] [name] MRN: [id number] DOB: [birth year] Today's Date: 03/29/2023   History of Present Illness [name] [name] is a 62 y.o. male who presents to the ED with worsening mental status. He was admitted with acute metabolic encephalopathy secondary to UTI and COVID-pneumonia and also missed hemodialysis sections. He was found to have MRSA and enterococcal bacteremia. He was started on Vancomycin. Patient was transferred to [facility] for fistula removal. 10/2 s/p placement of temporary dialysis catheter (20cm RIJ) using ultrasound and fluoroscopic guidance, harvest of left basilic vein, direct repair of left brachial artery with vein patch angioplasty, excision of left arm arteriovenous graft.  10/11: Pt slid OOB, no injuries. PMH: ESRD on HD MWF, hypertension, type 2 diabetes, pulmonary hypertension, cardiomyopathy, GERD, dyslipidemia, and anemia of CKD.    PT Comments  Pt received in supine, RN clearance for session given pt cognitive deficit. Pt appears more confused this session and making multiple requests (c/o pain but refusing pain meds, c/o hunger but refusing to state food preferences and refusing all food/drinks offered) but unable to be satisfied when PTA/RN offer solutions to his requests. Pt needing increased assist up to maxA (+2 safety) for transfer to/from sitting EOB and adamantly refusing STS attempt. Pt moaning/calling out but inconsistent with c/o pain and did not allow PTA to fully remove/replace slightly damp bed that was under him. Pt continues to benefit from PT services to progress toward functional mobility goals once he is more cooperative.    If plan is discharge home, recommend the following: A lot of help with bathing/dressing/bathroom;Assistance with cooking/housework;Help with stairs or ramp for entrance;Assist for transportation;Two people to help with walking and/or transfers;Direct supervision/assist for medications  management;Direct supervision/assist for financial management;Supervision due to cognitive status   Can travel by private vehicle     No  Equipment Recommendations  BSC/3in1;Hoyer lift;Other (comment) (consider hospital bed, wheelchair)    Recommendations for Other Services       Precautions / Restrictions Precautions Precautions: Fall Precaution Comments: History of falls, Contact precs Restrictions Weight Bearing Restrictions: No     Mobility  Bed Mobility Overal bed mobility: Needs Assistance Bed Mobility: Rolling, Sidelying to Sit, Sit to Sidelying Rolling: Mod assist Sidelying to sit: Max assist, +2 for safety/equipment, Used rails     Sit to sidelying: Max assist, +2 for physical assistance General bed mobility comments: Pt with poor participation and difficulty sequencing vs poor effort. Pt becomes more frustrated with static sitting EOB and needs increased support this date.    Transfers Overall transfer level: Needs assistance                 General transfer comment: Defer, pt too anxious/agitated after sitting ~2 mins EOB. Mod to maxA for static sitting EOB this date and pt with increased lability.    Ambulation/Gait                   Stairs             Wheelchair Mobility     Tilt Bed    Modified Rankin (Stroke Patients Only)       Balance Overall balance assessment: Needs assistance Sitting-balance support: Feet supported, Bilateral upper extremity supported Sitting balance-Leahy Scale: Fair Sitting balance - Comments: Poor due to behaviors/impulsivity, pt w/o complaint of dizziness or lightheadedness but tolerates sitting EOB ~2 mins only. Postural control: Posterior lean, Left lateral lean (Lean toward HOB)     Standing balance comment: pt refusing  Cognition Arousal: Alert Behavior During Therapy: Anxious, Restless, Lability, Impulsive Overall Cognitive Status: History of  cognitive impairments - at baseline Area of Impairment: Safety/judgement, Following commands, Problem solving, Awareness                     Memory: Decreased recall of precautions, Decreased short-term memory Following Commands: Follows one step commands with increased time Safety/Judgement: Decreased awareness of safety, Decreased awareness of deficits Awareness: Intellectual Problem Solving: Difficulty sequencing, Requires verbal cues, Requires tactile cues, Decreased initiation General Comments: Pt very self-limiting and anxious regarding any bed mobility or repositioning. Pt initially slightly cooperative but becoming more agitated/anxious as session continued. Pt expressing frustration over dinner not arrived yet, when PTA asked him what he wants to eat, pt unable to state any preference. RN and PTA offered him snack options/protein shake. Poor carryover of safety instructions. Pt appears to have intellectual disability at baseline but per mother has been less cooperative since recent Covid illness in past couple months.        Exercises Other Exercises Other Exercises:  (pt refusing today, difficult to redirect)    General Comments General comments (skin integrity, edema, etc.): Pt c/o bottom discomfort, PTA had him turn to his R to prepare new (dry/clean) bed pad and to roll old pad for removal, pt instructed to roll to other side so new pad can be unrolled and old pad removed, pt adamantly refusing at that time. Pt requested HOB elevated so bed placed in chair posture. RN notified pt will need pad adjustment/removal completed once he is cooperative.      Pertinent Vitals/Pain Pain Assessment Pain Assessment: PAINAD Breathing: normal Negative Vocalization: repeated troubled calling out, loud moaning/groaning, crying Facial Expression: facial grimacing Body Language: tense, distressed pacing, fidgeting Consolability: distracted or reassured by voice/touch PAINAD Score:  6 Pain Location: intermittent c/o BLE pain Pain Descriptors / Indicators: Guarding, Grimacing, Moaning Pain Intervention(s): Limited activity within patient's tolerance, Monitored during session, Repositioned, Other (comment) (RN offered pt medication during session but he refuses and states "I don't like applesauce".)    Home Living                          Prior Function            PT Goals (current goals can now be found in the care plan section) Acute Rehab PT Goals Patient Stated Goal: to go home PT Goal Formulation: With patient/family Time For Goal Achievement: 04/05/23 Progress towards PT goals: Progressing toward goals    Frequency    Min 1X/week      PT Plan      Co-evaluation              AM-PAC PT "6 Clicks" Mobility   Outcome Measure  Help needed turning from your back to your side while in a flat bed without using bedrails?: A Lot Help needed moving from lying on your back to sitting on the side of a flat bed without using bedrails?: A Lot Help needed moving to and from a bed to a chair (including a wheelchair)?: Total Help needed standing up from a chair using your arms (e.g., wheelchair or bedside chair)?: Total Help needed to walk in hospital room?: Total Help needed climbing 3-5 steps with a railing? : Total 6 Click Score: 8    End of Session   Activity Tolerance: Treatment limited secondary to medical complications (Comment);Other (comment);Patient limited by pain (self-limiting  behaviors, c/o pain but refusing meds from RN) Patient left: Other (comment);in bed;with bed alarm set;with call bell/phone within reach (bed in chair posture) Nurse Communication: Mobility status;Need for lift equipment;Other (comment) (pt wants food but does not state what; pt refusing to allow bed pad replacement/removal, old/new pads under him) PT Visit Diagnosis: Unsteadiness on feet (R26.81);Other abnormalities of gait and mobility (R26.89);Muscle  weakness (generalized) (M62.81)     Time: 1610-9604 PT Time Calculation (min) (ACUTE ONLY): 17 min  Charges:    $Therapeutic Activity: 8-22 mins PT General Charges $$ ACUTE PT VISIT: 1 Visit                     Raiana Pharris P., PTA Acute Rehabilitation Services Secure Chat Preferred 9a-5:30pm Office: [phone]    Angus Palms 03/29/2023, 3:31 PM

## 2023-03-29 NOTE — Progress Notes (Addendum)
[facility] Progress Note   Subjective:   Seen at onset of HD - 1L UFG. Denies CP/dyspnea.   Objective Vitals:   03/28/23 1623 03/28/23 2048 03/29/23 0629 03/29/23 0843  BP: (!) 160/79 (!) 144/81 (!) 170/82 122/66  Pulse: 80 76 70 70  Resp: 20 16  13   Temp: (!) 97.5 F (36.4 C) 98.4 F (36.9 C) 97.8 F (36.6 C) 97.9 F (36.6 C)  TempSrc:   Oral   SpO2: 100% 100% 100% 95%  Weight:    54.8 kg  Height:       Physical Exam General: Frail appearing man, NAD. Calm. Room air Heart: RRR; no murmur Lungs: CTA anteriorly Abdomen: soft Extremities: no LE edema Dialysis Access: TDC, staples in place LUE  Additional Objective Labs: Basic Metabolic Panel: Recent Labs  Lab 03/25/23 1302 03/26/23 0851 03/27/23 0704  NA 143 136 136  K 4.0 3.8 3.8  CL 103 101 100  CO2 24 22 23   GLUCOSE 136* 78 126*  BUN 44* 24* 35*  CREATININE 7.56* 4.57* 5.75*  CALCIUM 8.5* 8.2* 8.0*  PHOS 3.5  --  3.1   Liver Function Tests: Recent Labs  Lab 03/27/23 0704  ALBUMIN 2.0*   CBC: Recent Labs  Lab 03/22/23 1058 03/23/23 0917 03/25/23 1302 03/26/23 0851 03/27/23 0704  WBC 6.3 7.1 3.9* 4.7 4.0  NEUTROABS 4.7 4.9 2.4 2.6 2.2  HGB 10.0* 10.5* 9.3* 10.1* 8.9*  HCT 31.7* 32.7* 29.6* 32.0* 28.5*  MCV 88.5 91.1 90.8 91.7 89.9  PLT 137* 167 141* 128* 118*   Medications:  anticoagulant sodium citrate     vancomycin 750 mg (03/27/23 1134)    amLODipine  10 mg Oral q morning   atorvastatin  20 mg Oral q morning   Chlorhexidine Gluconate Cloth  6 each Topical Q0600   darbepoetin (ARANESP) injection - DIALYSIS  60 mcg Subcutaneous Q Thu-1800   feeding supplement (NEPRO CARB STEADY)  237 mL Oral TID BM   heparin  2,000 Units Dialysis Once in dialysis   heparin injection (subcutaneous)  5,000 Units Subcutaneous Q8H   heparin sodium (porcine)  3,200 Units Intracatheter Once   hydrALAZINE  10 mg Oral Q8H   insulin aspart  0-9 Units Subcutaneous TID WC   levothyroxine  25  mcg Oral QAC breakfast   liver oil-zinc oxide   Topical Daily   metoprolol tartrate  50 mg Oral BID   multivitamin with minerals  1 tablet Oral Daily   pantoprazole  40 mg Oral Daily   polyethylene glycol  17 g Oral Daily   sodium chloride flush  10-40 mL Intracatheter Q12H    Dialysis Orders: MWF at [facility] 4[facility], 400/1.5, EDW 64kg, 3K/2.5Ca, AVG, no heparin - Mircera IV q 2 weeks (last 9/11) - Venofer 50mg  IV q week   Assessment/Plan: MRSA + Enterococcus bacteremia/MV endocarditis: ID and vascular surgery following. AVG seeded, s/p excision of AVG. TDC placed 10/12 by [facility]. TEE 10/7 with MV endocarditis noted. On Vanc 750mg  q HD for 6 week course (9/24 - 05/02/2023) AMS: Waxing/waning per notes, baseline dementia. ESRD: Continue HD on MWF schedule - HD today, 1L UFG. HTN/volume: BP variable, no edema. Well below prior EDW - lower on d/c. Continue home meds. COVID pneumonia (+ 9/23): S/p remdesivir. Out of isolation. Anemia of ESRD: Hgb down to 8.9 - continue Aranesp q Thursday - have ^d next dose. Secondary HPTH: Ca/Phos to goal - no binders. T2DM: Per primary. Nutrition: Alb low, continue supplements.  Debility: Chronic issue. Previously refusing SNF per notes - now agreeable, authorization P.   Ozzie Hoyle, PA-C 03/29/2023, 8:47 AM  [facility]  I personally saw and evaluated the patient and agree with the assessment and plan by Cyndie Mull, PA-C.   Estill Bakes MD [facility] Pager [phone]

## 2023-03-31 DIAGNOSIS — K219 Gastro-esophageal reflux disease without esophagitis: Secondary | ICD-10-CM | POA: Diagnosis not present

## 2023-03-31 DIAGNOSIS — E1122 Type 2 diabetes mellitus with diabetic chronic kidney disease: Secondary | ICD-10-CM | POA: Diagnosis not present

## 2023-03-31 DIAGNOSIS — N2581 Secondary hyperparathyroidism of renal origin: Secondary | ICD-10-CM | POA: Diagnosis not present

## 2023-03-31 DIAGNOSIS — N186 End stage renal disease: Secondary | ICD-10-CM | POA: Diagnosis not present

## 2023-03-31 DIAGNOSIS — Z992 Dependence on renal dialysis: Secondary | ICD-10-CM | POA: Diagnosis not present

## 2023-03-31 DIAGNOSIS — I10 Essential (primary) hypertension: Secondary | ICD-10-CM | POA: Diagnosis not present

## 2023-04-03 DIAGNOSIS — I10 Essential (primary) hypertension: Secondary | ICD-10-CM | POA: Diagnosis not present

## 2023-04-03 DIAGNOSIS — Z992 Dependence on renal dialysis: Secondary | ICD-10-CM | POA: Diagnosis not present

## 2023-04-03 DIAGNOSIS — N2581 Secondary hyperparathyroidism of renal origin: Secondary | ICD-10-CM | POA: Diagnosis not present

## 2023-04-03 DIAGNOSIS — N186 End stage renal disease: Secondary | ICD-10-CM | POA: Diagnosis not present

## 2023-04-04 DIAGNOSIS — G9341 Metabolic encephalopathy: Secondary | ICD-10-CM | POA: Diagnosis not present

## 2023-04-04 DIAGNOSIS — Z7189 Other specified counseling: Secondary | ICD-10-CM | POA: Diagnosis not present

## 2023-04-05 DIAGNOSIS — M6281 Muscle weakness (generalized): Secondary | ICD-10-CM | POA: Diagnosis not present

## 2023-04-05 DIAGNOSIS — R278 Other lack of coordination: Secondary | ICD-10-CM | POA: Diagnosis not present

## 2023-04-05 DIAGNOSIS — I059 Rheumatic mitral valve disease, unspecified: Secondary | ICD-10-CM | POA: Diagnosis not present

## 2023-04-05 DIAGNOSIS — B9562 Methicillin resistant Staphylococcus aureus infection as the cause of diseases classified elsewhere: Secondary | ICD-10-CM | POA: Diagnosis not present

## 2023-04-05 DIAGNOSIS — R7881 Bacteremia: Secondary | ICD-10-CM | POA: Diagnosis not present

## 2023-04-05 DIAGNOSIS — N186 End stage renal disease: Secondary | ICD-10-CM | POA: Diagnosis not present

## 2023-04-05 DIAGNOSIS — N2581 Secondary hyperparathyroidism of renal origin: Secondary | ICD-10-CM | POA: Diagnosis not present

## 2023-04-05 DIAGNOSIS — Z992 Dependence on renal dialysis: Secondary | ICD-10-CM | POA: Diagnosis not present

## 2023-04-05 DIAGNOSIS — F419 Anxiety disorder, unspecified: Secondary | ICD-10-CM | POA: Diagnosis not present

## 2023-04-05 DIAGNOSIS — R2681 Unsteadiness on feet: Secondary | ICD-10-CM | POA: Diagnosis not present

## 2023-04-06 ENCOUNTER — Telehealth (INDEPENDENT_AMBULATORY_CARE_PROVIDER_SITE_OTHER): Payer: Self-pay | Admitting: Gastroenterology

## 2023-04-06 ENCOUNTER — Ambulatory Visit (HOSPITAL_COMMUNITY): Admission: RE | Admit: 2023-04-06 | Payer: Medicare HMO | Source: Ambulatory Visit

## 2023-04-06 DIAGNOSIS — R296 Repeated falls: Secondary | ICD-10-CM | POA: Diagnosis not present

## 2023-04-06 DIAGNOSIS — G9341 Metabolic encephalopathy: Secondary | ICD-10-CM | POA: Diagnosis not present

## 2023-04-06 DIAGNOSIS — E039 Hypothyroidism, unspecified: Secondary | ICD-10-CM | POA: Diagnosis not present

## 2023-04-06 NOTE — Telephone Encounter (Signed)
Spoke with mom and pt is currently admitted in [place]. Mom states she does not believe pt needs CT at this time. Pt is on dialysis 3 days a week.

## 2023-04-06 NOTE — Telephone Encounter (Signed)
Hello, this pt is on our schedule tomorrow for a contrasted study. It appears he started dialysis June of this year. If that is correct we need informed consent to be signed and clearance by the pts nephrologist before we can give IV contrast to any pt who hasn't been on dialysis for at least 6 months.   Wed 4:37 PM You were added by Franky Macho, MD. Wed 4:54 PM MA Franky Macho, MD Hi [name] [name] how can we get clearance from patient nephrologist for CT scan with IV contrast ?  Wed 4:55 PM MD Kirstie Mirza Do we need to reschedule this patient? Nephrology consult and then provider would need to sign consent along with patient before we can inject contrast. The patient's appt is today at 10am. We could change the order to without contrast is needed. Patient has had CT's in the past without contrast.   7:22 AM MA Franky Macho, MD we can reschedule as it needs to be done with contrast or the study will not be adeqauet   1  54 mins MA I will send for clearance  16 mins JC [name] Do you need a copy of the consent form?  14 mins yes please  14 mins JC [name] fax number?  11 mins [phone]   Fax sent to nephrologist [facility])

## 2023-04-06 NOTE — Telephone Encounter (Signed)
Pt is needing CT abdomen/pelvis w/wo contrast. Requesting nephrology consult. Please advise. Thank you

## 2023-04-10 DIAGNOSIS — N186 End stage renal disease: Secondary | ICD-10-CM | POA: Diagnosis not present

## 2023-04-10 DIAGNOSIS — N2581 Secondary hyperparathyroidism of renal origin: Secondary | ICD-10-CM | POA: Diagnosis not present

## 2023-04-10 DIAGNOSIS — Z992 Dependence on renal dialysis: Secondary | ICD-10-CM | POA: Diagnosis not present

## 2023-04-10 NOTE — Progress Notes (Deleted)
[facility] [place]  ASSESSMENT / PLAN: 62 y.o. male with end-stage renal disease on hemodialysis through tunneled dialysis catheter.  He had a left arm arteriovenous graft completely removed for concern for infection; bacteremia; endocarditis.  No evidence of graft infection was found.  He needs new access.  Will plan for right arm arteriovenous graft in the near future.  CHIEF COMPLAINT: ***  HISTORY OF PRESENT ILLNESS: [name] [name] is a 62 y.o. male ***  VASCULAR SURGICAL HISTORY: ***  VASCULAR RISK FACTORS: {FINDINGS; POSITIVE [phone]} history of stroke / transient ischemic attack. {FINDINGS; POSITIVE [phone]} history of coronary artery disease. *** history of PCI. *** history of CABG.  {FINDINGS; POSITIVE [phone]} history of diabetes mellitus. Last A1c ***. {FINDINGS; POSITIVE [phone]} history of smoking. *** actively smoking. {FINDINGS; POSITIVE [phone]} history of hypertension. *** drug regimen with *** control. {FINDINGS; POSITIVE [phone]} history of chronic kidney disease.  Last GFR ***. CKD {stage:30421363}. {FINDINGS; POSITIVE [phone]} history of chronic obstructive pulmonary disease, treated with ***.  FUNCTIONAL STATUS: ECOG performance status: {findings; ecog performance status:31780} Ambulatory status: {TNHAmbulation:25868}  CAREY 1 AND 3 YEAR INDEX Male (2pts) 75-79 or 80-84 (2pts) >84 (3pts) Dependence in toileting (1pt) Partial or full dependence in dressing (1pt) History of malignant neoplasm (2pts) CHF (3pts) COPD (1pts) CKD (3pts)  0-3 pts 6% 1 year mortality ; 21% 3 year mortality 4-5 pts 12% 1 year mortality ; 36% 3 year mortality >5 pts 21% 1 year mortality; 54% 3 year mortality   Past Medical History:  Diagnosis Date   Anemia    Arthritis    Cardiomyopathy (HCC)    July 2022   Diabetes mellitus (HCC)    GERD (gastroesophageal reflux  disease)    HTN (hypertension)    Hyperlipidemia    Nephrotic syndrome    Obesity    Pulmonary hypertension (HCC)    July 2022 in setting of nephrotic syndrome   Secondary hyperparathyroidism of renal origin [facility])     Past Surgical History:  Procedure Laterality Date   AV FISTULA PLACEMENT Left 12/27/2022   Procedure: INSERTION OF LEFT ARM ARTERIOVENOUS (AV) GORE-TEX GRAFT;  Surgeon: Chuck Hint, MD;  Location: [facility] OR;  Service: Vascular;  Laterality: Left;   AVGG REMOVAL Left 03/15/2023   Procedure: REMOVAL OF LEFT UPPER ARM ARTERIOVENOUS GORETEX GRAFT (AVGG);  Surgeon: [name], MD;  Location[facility] OR;  Service: Vascular;  Laterality: Left;   BIOPSY  01/13/2023   Procedure: BIOPSY;  Surgeon: Franky Macho, MD;  Location: [facility] ENDO SUITE;  Service: Endoscopy;;   CATARACT EXTRACTION, BILATERAL  2018   COLONOSCOPY WITH PROPOFOL N/A 01/13/2023   Procedure: COLONOSCOPY WITH PROPOFOL;  Surgeon: Franky Macho, MD;  Location: [facility] ENDO SUITE;  Service: Endoscopy;  Laterality: N/A;   INSERTION OF DIALYSIS CATHETER Right 12/27/2022   Procedure: INSERTION OF Right Internal Jugular  DIALYSIS CATHETER;  Surgeon: Chuck Hint, MD;  Location: [facility] OR;  Service: Vascular;  Laterality: Right;   INSERTION OF DIALYSIS CATHETER N/A 03/15/2023   Procedure: INSERTION OF TUNNELED DIALYSIS CATHETER;  Surgeon: [name], MD;  Location: [facility] OR;  Service: Vascular;  Laterality: N/A;   INSERTION OF DIALYSIS CATHETER Right 03/25/2023   Procedure: INSERTION OF RIGHT TUNNELED DIALYSIS CATHETER;  Surgeon: Daria Pastures, MD;  Location: [facility] OR;  Service: Vascular;  Laterality: Right;   TEE WITHOUT CARDIOVERSION N/A 03/20/2023   Procedure: TRANSESOPHAGEAL ECHOCARDIOGRAM;  Surgeon: Sande Rives, MD;  Location: [facility] OR;  Service:  Cardiovascular;  Laterality: N/A;   TONSILLECTOMY      Family History  Problem Relation Age of Onset   Hypertension Mother    Diabetes Mother    Cancer  Father        prostate cancer   Hypertension Maternal Grandfather    Rheum arthritis Maternal Grandmother    CAD Neg Hx     Social History   Socioeconomic History   Marital status: Single    Spouse name: Not on file   Number of children: Not on file   Years of education: Not on file   Highest education level: Not on file  Occupational History   Not on file  Tobacco Use   Smoking status: Never   Smokeless tobacco: Never  Vaping Use   Vaping status: Never Used  Substance and Sexual Activity   Alcohol use: Never   Drug use: Never   Sexual activity: Not on file  Other Topics Concern   Not on file  Social History Narrative   Not on file   Social Determinants of Health   Financial Resource Strain: Not on file  Food Insecurity: No Food Insecurity (03/06/2023)   Hunger Vital Sign    Worried About Running Out of Food in the Last Year: Never true    Ran Out of Food in the Last Year: Never true  Transportation Needs: No Transportation Needs (03/06/2023)   PRAPARE - Administrator, Civil Service (Medical): No    Lack of Transportation (Non-Medical): No  Physical Activity: Not on file  Stress: Not on file  Social Connections: Not on file  Intimate Partner Violence: Not At Risk (03/06/2023)   Humiliation, Afraid, Rape, and Kick questionnaire    Fear of Current or Ex-Partner: No    Emotionally Abused: No    Physically Abused: No    Sexually Abused: No    No Known Allergies  Current Outpatient Medications  Medication Sig Dispense Refill   Accu-Chek Softclix Lancets lancets      acetaminophen (TYLENOL) 500 MG tablet Take 500 mg by mouth every 6 (six) hours as needed (arthritis).      amLODipine (NORVASC) 10 MG tablet Take 1 tablet (10 mg total) by mouth every morning. 30 tablet 0   atorvastatin (LIPITOR) 20 MG tablet Take 20 mg by mouth every morning.     Blood Glucose Monitoring Suppl (ACCU-CHEK GUIDE) w/Device KIT in the morning.     hydrALAZINE (APRESOLINE) 10  MG tablet Take 1 tablet (10 mg total) by mouth every 8 (eight) hours. 90 tablet 0   levothyroxine (SYNTHROID) 25 MCG tablet Take 1 tablet (25 mcg total) by mouth daily before breakfast. 30 tablet 0   metoprolol tartrate (LOPRESSOR) 50 MG tablet Take 1 tablet (50 mg total) by mouth 2 (two) times daily. 60 tablet 0   Multiple Vitamin (MULTIVITAMIN WITH MINERALS) TABS tablet Take 1 tablet by mouth daily. 30 tablet 0   pantoprazole (PROTONIX) 40 MG tablet Take 1 tablet (40 mg total) by mouth daily. 30 tablet 1   polyethylene glycol (MIRALAX / GLYCOLAX) 17 g packet Take 17 g by mouth daily. 14 each 0   vancomycin IVPB Inject 750 mg into the vein every Monday, Wednesday, and Friday with hemodialysis. Indication:  MRSA MV IE Last Day of Therapy:  05/02/23 Labs - Once weekly: ESR and CRP, CBC/D, BMP pre-HD Vancomycin level Method of administration: Per hemodialysis protocol - to be given at HD center     No current  facility-administered medications for this visit.    PHYSICAL EXAM There were no vitals filed for this visit.  Constitutional: *** appearing. *** distress. Appears *** nourished.  Neurologic: CN ***. *** focal findings. *** sensory loss. Psychiatric: *** Mood and affect symmetric and appropriate. Eyes: *** No icterus. No conjunctival pallor. Ears, nose, throat: *** mucous membranes moist. Midline trachea.  Cardiac: *** rate and rhythm.  Respiratory: *** unlabored. Abdominal: *** soft, non-tender, non-distended.  Peripheral vascular: *** Extremity: *** edema. *** cyanosis. *** pallor.  Skin: *** gangrene. *** ulceration.  Lymphatic: *** Stemmer's sign. *** palpable lymphadenopathy.    PERTINENT LABORATORY AND RADIOLOGIC DATA  Most recent CBC    Latest Ref Rng & Units 03/29/2023    9:00 AM 03/27/2023    7:04 AM 03/26/2023    8:51 AM  CBC  WBC 4.0 - 10.5 K/uL 4.6  4.0  4.7   Hemoglobin 13.0 - 17.0 g/dL 8.7  8.9  40.9   Hematocrit 39.0 - 52.0 % 26.9  28.5  32.0   Platelets  150 - 400 K/uL 118  118  128      Most recent CMP    Latest Ref Rng & Units 03/29/2023    9:00 AM 03/27/2023    7:04 AM 03/26/2023    8:51 AM  CMP  Glucose 70 - 99 mg/dL 811  914  78   BUN 8 - 23 mg/dL 30  35  24   Creatinine 0.61 - 1.24 mg/dL 7.82  9.56  2.13   Sodium 135 - 145 mmol/L 136  136  136   Potassium 3.5 - 5.1 mmol/L 3.1  3.8  3.8   Chloride 98 - 111 mmol/L 101  100  101   CO2 22 - 32 mmol/L 28  23  22    Calcium 8.9 - 10.3 mg/dL 8.4  8.0  8.2     Renal function Estimated Creatinine Clearance: 11 mL/min (A) (by C-G formula based on SCr of 5.24 mg/dL (H)).  Hgb A1c MFr Bld (%)  Date Value  01/11/2023 5.1    LDL Cholesterol  Date Value Ref Range Status  04/27/2020 102 (H) 0 - 99 mg/dL Final    Comment:           Total Cholesterol/HDL:CHD Risk Coronary Heart Disease Risk Table                     Men   Women  1/2 Average Risk   3.4   3.3  Average Risk       5.0   4.4  2 X Average Risk   9.6   7.1  3 X Average Risk  23.4   11.0        Use the calculated Patient Ratio above and the CHD Risk Table to determine the patient's CHD Risk.        ATP III CLASSIFICATION (LDL):  <100     mg/dL   Optimal  086-578  mg/dL   Near or Above                    Optimal  130-159  mg/dL   Borderline  469-629  mg/dL   High  >528     mg/dL   Very High Performed at [facility], [address], [place], [place] [address]      Vascular Imaging: ***  [name]. Lenell Antu, MD [facility] Phone Number: [phone] 04/10/2023  10:14 AM   Total time spent on preparing this encounter including chart review, data review, collecting history, examining the patient, coordinating care for this {tnhtimebilling:26202}  Portions of this report may have been transcribed using voice recognition software.  Every effort has been made to ensure accuracy; however, inadvertent computerized transcription errors may still be present.

## 2023-04-11 ENCOUNTER — Encounter: Payer: Medicare HMO | Admitting: Vascular Surgery

## 2023-04-11 LAB — GLUCOSE, CAPILLARY: Glucose-Capillary: 127 mg/dL — ABNORMAL HIGH (ref 70–99)

## 2023-04-12 DIAGNOSIS — N2581 Secondary hyperparathyroidism of renal origin: Secondary | ICD-10-CM | POA: Diagnosis not present

## 2023-04-12 DIAGNOSIS — Z992 Dependence on renal dialysis: Secondary | ICD-10-CM | POA: Diagnosis not present

## 2023-04-12 DIAGNOSIS — N186 End stage renal disease: Secondary | ICD-10-CM | POA: Diagnosis not present

## 2023-04-13 DIAGNOSIS — N186 End stage renal disease: Secondary | ICD-10-CM | POA: Diagnosis not present

## 2023-04-13 DIAGNOSIS — Z992 Dependence on renal dialysis: Secondary | ICD-10-CM | POA: Diagnosis not present

## 2023-04-13 DIAGNOSIS — E1122 Type 2 diabetes mellitus with diabetic chronic kidney disease: Secondary | ICD-10-CM | POA: Diagnosis not present

## 2023-04-17 DIAGNOSIS — N186 End stage renal disease: Secondary | ICD-10-CM | POA: Diagnosis not present

## 2023-04-17 DIAGNOSIS — N2581 Secondary hyperparathyroidism of renal origin: Secondary | ICD-10-CM | POA: Diagnosis not present

## 2023-04-17 DIAGNOSIS — Z992 Dependence on renal dialysis: Secondary | ICD-10-CM | POA: Diagnosis not present

## 2023-04-18 ENCOUNTER — Ambulatory Visit: Payer: Medicare HMO | Admitting: Infectious Disease

## 2023-04-18 DIAGNOSIS — I120 Hypertensive chronic kidney disease with stage 5 chronic kidney disease or end stage renal disease: Secondary | ICD-10-CM | POA: Diagnosis not present

## 2023-04-18 DIAGNOSIS — E46 Unspecified protein-calorie malnutrition: Secondary | ICD-10-CM | POA: Diagnosis not present

## 2023-04-18 DIAGNOSIS — Z09 Encounter for follow-up examination after completed treatment for conditions other than malignant neoplasm: Secondary | ICD-10-CM | POA: Diagnosis not present

## 2023-04-18 DIAGNOSIS — E1122 Type 2 diabetes mellitus with diabetic chronic kidney disease: Secondary | ICD-10-CM | POA: Diagnosis not present

## 2023-04-18 DIAGNOSIS — Z993 Dependence on wheelchair: Secondary | ICD-10-CM | POA: Diagnosis not present

## 2023-04-18 DIAGNOSIS — Z992 Dependence on renal dialysis: Secondary | ICD-10-CM | POA: Diagnosis not present

## 2023-04-18 DIAGNOSIS — N186 End stage renal disease: Secondary | ICD-10-CM | POA: Diagnosis not present

## 2023-04-18 DIAGNOSIS — I38 Endocarditis, valve unspecified: Secondary | ICD-10-CM | POA: Diagnosis not present

## 2023-04-19 DIAGNOSIS — N2581 Secondary hyperparathyroidism of renal origin: Secondary | ICD-10-CM | POA: Diagnosis not present

## 2023-04-19 DIAGNOSIS — Z992 Dependence on renal dialysis: Secondary | ICD-10-CM | POA: Diagnosis not present

## 2023-04-19 DIAGNOSIS — N186 End stage renal disease: Secondary | ICD-10-CM | POA: Diagnosis not present

## 2023-04-20 DIAGNOSIS — I279 Pulmonary heart disease, unspecified: Secondary | ICD-10-CM | POA: Diagnosis not present

## 2023-04-20 DIAGNOSIS — E1122 Type 2 diabetes mellitus with diabetic chronic kidney disease: Secondary | ICD-10-CM | POA: Diagnosis not present

## 2023-04-20 DIAGNOSIS — E785 Hyperlipidemia, unspecified: Secondary | ICD-10-CM | POA: Diagnosis not present

## 2023-04-20 DIAGNOSIS — I120 Hypertensive chronic kidney disease with stage 5 chronic kidney disease or end stage renal disease: Secondary | ICD-10-CM | POA: Diagnosis not present

## 2023-04-20 DIAGNOSIS — G9341 Metabolic encephalopathy: Secondary | ICD-10-CM | POA: Diagnosis not present

## 2023-04-20 DIAGNOSIS — E039 Hypothyroidism, unspecified: Secondary | ICD-10-CM | POA: Diagnosis not present

## 2023-04-20 DIAGNOSIS — N186 End stage renal disease: Secondary | ICD-10-CM | POA: Diagnosis not present

## 2023-04-20 DIAGNOSIS — D631 Anemia in chronic kidney disease: Secondary | ICD-10-CM | POA: Diagnosis not present

## 2023-04-20 DIAGNOSIS — I255 Ischemic cardiomyopathy: Secondary | ICD-10-CM | POA: Diagnosis not present

## 2023-04-21 DIAGNOSIS — N2581 Secondary hyperparathyroidism of renal origin: Secondary | ICD-10-CM | POA: Diagnosis not present

## 2023-04-21 DIAGNOSIS — N186 End stage renal disease: Secondary | ICD-10-CM | POA: Diagnosis not present

## 2023-04-21 DIAGNOSIS — Z992 Dependence on renal dialysis: Secondary | ICD-10-CM | POA: Diagnosis not present

## 2023-04-24 NOTE — Progress Notes (Unsigned)
[facility] [place]  ASSESSMENT / PLAN: 62 y.o. male with end-stage renal disease on hemodialysis through tunneled dialysis catheter.  He had a left arm arteriovenous graft completely removed for concern for infection; bacteremia; endocarditis.  No evidence of graft infection was found.  He needs new access.  Will plan for right arm arteriovenous graft in the near future.  CHIEF COMPLAINT: ***  HISTORY OF PRESENT ILLNESS: [name] [name] is a 62 y.o. male ***  VASCULAR SURGICAL HISTORY: ***  VASCULAR RISK FACTORS: {FINDINGS; POSITIVE [phone]} history of stroke / transient ischemic attack. {FINDINGS; POSITIVE [phone]} history of coronary artery disease. *** history of PCI. *** history of CABG.  {FINDINGS; POSITIVE [phone]} history of diabetes mellitus. Last A1c ***. {FINDINGS; POSITIVE NEGATIVE:([phone]} history of smoking. *** actively smoking. {FINDINGS; POSITIVE NEGATIVE:([phone]} history of hypertension. *** drug regimen with *** control. {FINDINGS; POSITIVE [phone]} history of chronic kidney disease.  Last GFR ***. CKD {stage:30421363}. {FINDINGS; POSITIVE [phone]} history of chronic obstructive pulmonary disease, treated with ***.  FUNCTIONAL STATUS: ECOG performance status: {findings; ecog performance status:31780} Ambulatory status: {TNHAmbulation:25868}  CAREY 1 AND 3 YEAR INDEX Male (2pts) 75-79 or 80-84 (2pts) >84 (3pts) Dependence in toileting (1pt) Partial or full dependence in dressing (1pt) History of malignant neoplasm (2pts) CHF (3pts) COPD (1pts) CKD (3pts)  0-3 pts 6% 1 year mortality ; 21% 3 year mortality 4-5 pts 12% 1 year mortality ; 36% 3 year mortality >5 pts 21% 1 year mortality; 54% 3 year mortality   Past Medical History:  Diagnosis Date   Anemia    Arthritis    Cardiomyopathy (HCC)    July 2022   Diabetes mellitus (HCC)    GERD (gastroesophageal reflux  disease)    HTN (hypertension)    Hyperlipidemia    Nephrotic syndrome    Obesity    Pulmonary hypertension (HCC)    July 2022 in setting of nephrotic syndrome   Secondary hyperparathyroidism of renal origin [facility])     Past Surgical History:  Procedure Laterality Date   AV FISTULA PLACEMENT Left 12/27/2022   Procedure: INSERTION OF LEFT ARM ARTERIOVENOUS (AV) GORE-TEX GRAFT;  Surgeon: Chuck Hint, MD;  [facility] OR;  Service: Vascular;  Laterality: Left;   AVGG REMOVAL Left 03/15/2023   Procedure: REMOVAL OF LEFT UPPER ARM ARTERIOVENOUS GORETEX GRAFT (AVGG);  Surgeon: [name], MD;  Location: [facility] OR;  Service: Vascular;  Laterality: Left;   BIOPSY  01/13/2023   Procedure: BIOPSY;  Surgeon: Franky Macho, MD;  [facility];  Service: Endoscopy;;   CATARACT EXTRACTION, BILATERAL  2018   COLONOSCOPY WITH PROPOFOL N/A 01/13/2023   Procedure: COLONOSCOPY WITH PROPOFOL;  Surgeon: Franky Macho, MD;  [facility];  Service: Endoscopy;  Laterality: N/A;   INSERTION OF DIALYSIS CATHETER Right 12/27/2022   Procedure: INSERTION OF Right Internal Jugular  DIALYSIS CATHETER;  Surgeon: Chuck Hint, MD;  Location: [facility] OR;  Service: Vascular;  Laterality: Right;   INSERTION OF DIALYSIS CATHETER N/A 03/15/2023   Procedure: INSERTION OF TUNNELED DIALYSIS CATHETER;  Surgeon: [name], MD;  Location: [facility] OR;  Service: Vascular;  Laterality: N/A;   INSERTION OF DIALYSIS CATHETER Right 03/25/2023   Procedure: INSERTION OF RIGHT TUNNELED DIALYSIS CATHETER;  Surgeon: Daria Pastures, MD;  Location: [facility] OR;  Service: Vascular;  Laterality: Right;   TEE WITHOUT CARDIOVERSION N/A 03/20/2023   Procedure: TRANSESOPHAGEAL ECHOCARDIOGRAM;  Surgeon: Sande Rives, MD;  Location: [facility] OR;  Service:  Cardiovascular;  Laterality: N/A;   TONSILLECTOMY      Family History  Problem Relation Age of Onset   Hypertension Mother    Diabetes Mother    Cancer  Father        prostate cancer   Hypertension Maternal Grandfather    Rheum arthritis Maternal Grandmother    CAD Neg Hx     Social History   Socioeconomic History   Marital status: Single    Spouse name: Not on file   Number of children: Not on file   Years of education: Not on file   Highest education level: Not on file  Occupational History   Not on file  Tobacco Use   Smoking status: Never   Smokeless tobacco: Never  Vaping Use   Vaping status: Never Used  Substance and Sexual Activity   Alcohol use: Never   Drug use: Never   Sexual activity: Not on file  Other Topics Concern   Not on file  Social History Narrative   Not on file   Social Determinants of Health   Financial Resource Strain: Not on file  Food Insecurity: No Food Insecurity (03/06/2023)   Hunger Vital Sign    Worried About Running Out of Food in the Last Year: Never true    Ran Out of Food in the Last Year: Never true  Transportation Needs: No Transportation Needs (03/06/2023)   PRAPARE - Administrator, Civil Service (Medical): No    Lack of Transportation (Non-Medical): No  Physical Activity: Not on file  Stress: Not on file  Social Connections: Not on file  Intimate Partner Violence: Not At Risk (03/06/2023)   Humiliation, Afraid, Rape, and Kick questionnaire    Fear of Current or Ex-Partner: No    Emotionally Abused: No    Physically Abused: No    Sexually Abused: No    No Known Allergies  Current Outpatient Medications  Medication Sig Dispense Refill   Accu-Chek Softclix Lancets lancets      acetaminophen (TYLENOL) 500 MG tablet Take 500 mg by mouth every 6 (six) hours as needed (arthritis).      amLODipine (NORVASC) 10 MG tablet Take 1 tablet (10 mg total) by mouth every morning. 30 tablet 0   atorvastatin (LIPITOR) 20 MG tablet Take 20 mg by mouth every morning.     Blood Glucose Monitoring Suppl (ACCU-CHEK GUIDE) w/Device KIT in the morning.     hydrALAZINE (APRESOLINE) 10  MG tablet Take 1 tablet (10 mg total) by mouth every 8 (eight) hours. 90 tablet 0   levothyroxine (SYNTHROID) 25 MCG tablet Take 1 tablet (25 mcg total) by mouth daily before breakfast. 30 tablet 0   metoprolol tartrate (LOPRESSOR) 50 MG tablet Take 1 tablet (50 mg total) by mouth 2 (two) times daily. 60 tablet 0   Multiple Vitamin (MULTIVITAMIN WITH MINERALS) TABS tablet Take 1 tablet by mouth daily. 30 tablet 0   pantoprazole (PROTONIX) 40 MG tablet Take 1 tablet (40 mg total) by mouth daily. 30 tablet 1   polyethylene glycol (MIRALAX / GLYCOLAX) 17 g packet Take 17 g by mouth daily. 14 each 0   vancomycin IVPB Inject 750 mg into the vein every Monday, Wednesday, and Friday with hemodialysis. Indication:  MRSA MV IE Last Day of Therapy:  05/02/23 Labs - Once weekly: ESR and CRP, CBC/D, BMP pre-HD Vancomycin level Method of administration: Per hemodialysis protocol - to be given at HD center     No current  facility-administered medications for this visit.    PHYSICAL EXAM There were no vitals filed for this visit.  Constitutional: *** appearing. *** distress. Appears *** nourished.  Neurologic: CN ***. *** focal findings. *** sensory loss. Psychiatric: *** Mood and affect symmetric and appropriate. Eyes: *** No icterus. No conjunctival pallor. Ears, nose, throat: *** mucous membranes moist. Midline trachea.  Cardiac: *** rate and rhythm.  Respiratory: *** unlabored. Abdominal: *** soft, non-tender, non-distended.  Peripheral vascular: *** Extremity: *** edema. *** cyanosis. *** pallor.  Skin: *** gangrene. *** ulceration.  Lymphatic: *** Stemmer's sign. *** palpable lymphadenopathy.    PERTINENT LABORATORY AND RADIOLOGIC DATA  Most recent CBC    Latest Ref Rng & Units 03/29/2023    9:00 AM 03/27/2023    7:04 AM 03/26/2023    8:51 AM  CBC  WBC 4.0 - 10.5 K/uL 4.6  4.0  4.7   Hemoglobin 13.0 - 17.0 g/dL 8.7  8.9  16.1   Hematocrit 39.0 - 52.0 % 26.9  28.5  32.0   Platelets  150 - 400 K/uL 118  118  128      Most recent CMP    Latest Ref Rng & Units 03/29/2023    9:00 AM 03/27/2023    7:04 AM 03/26/2023    8:51 AM  CMP  Glucose 70 - 99 mg/dL 096  045  78   BUN 8 - 23 mg/dL 30  35  24   Creatinine 0.61 - 1.24 mg/dL 4.09  8.11  9.14   Sodium 135 - 145 mmol/L 136  136  136   Potassium 3.5 - 5.1 mmol/L 3.1  3.8  3.8   Chloride 98 - 111 mmol/L 101  100  101   CO2 22 - 32 mmol/L 28  23  22    Calcium 8.9 - 10.3 mg/dL 8.4  8.0  8.2     Renal function CrCl cannot be calculated (Patient's most recent lab result is older than the maximum 21 days allowed.).  Hgb A1c MFr Bld (%)  Date Value  01/11/2023 5.1    LDL Cholesterol  Date Value Ref Range Status  04/27/2020 102 (H) 0 - 99 mg/dL Final    Comment:           Total Cholesterol/HDL:CHD Risk Coronary Heart Disease Risk Table                     Men   Women  1/2 Average Risk   3.4   3.3  Average Risk       5.0   4.4  2 X Average Risk   9.6   7.1  3 X Average Risk  23.4   11.0        Use the calculated Patient Ratio above and the CHD Risk Table to determine the patient's CHD Risk.        ATP III CLASSIFICATION (LDL):  <100     mg/dL   Optimal  782-956  mg/dL   Near or Above                    Optimal  130-159  mg/dL   Borderline  213-086  mg/dL   High  >578     mg/dL   Very High Performed at [facility], [address], [place], [place] [address]      Vascular Imaging: ***  [name]. Lenell Antu, MD FACS [facility] Phone Number: [phone]  04/24/2023 9:47 AM   Total time spent on preparing this encounter including chart review, data review, collecting history, examining the patient, coordinating care for this {tnhtimebilling:26202}  Portions of this report may have been transcribed using voice recognition software.  Every effort has been made to ensure accuracy; however, inadvertent computerized transcription errors may still be present.

## 2023-04-25 ENCOUNTER — Ambulatory Visit (INDEPENDENT_AMBULATORY_CARE_PROVIDER_SITE_OTHER): Payer: Medicare HMO | Admitting: Vascular Surgery

## 2023-04-25 VITALS — BP 145/74 | HR 61 | Temp 97.7°F | Resp 20 | Ht 70.0 in | Wt 120.0 lb

## 2023-04-25 DIAGNOSIS — N186 End stage renal disease: Secondary | ICD-10-CM

## 2023-04-25 DIAGNOSIS — Z992 Dependence on renal dialysis: Secondary | ICD-10-CM

## 2023-04-25 NOTE — Telephone Encounter (Signed)
Fax from nephrology stating that contrast is OK for CT. Pt should come to MD the next day. Please see telephone message from 04/06/23 stating that pt in [facility].

## 2023-04-26 DIAGNOSIS — N186 End stage renal disease: Secondary | ICD-10-CM | POA: Diagnosis not present

## 2023-04-26 DIAGNOSIS — Z992 Dependence on renal dialysis: Secondary | ICD-10-CM | POA: Diagnosis not present

## 2023-04-26 DIAGNOSIS — N2581 Secondary hyperparathyroidism of renal origin: Secondary | ICD-10-CM | POA: Diagnosis not present

## 2023-04-27 DIAGNOSIS — E039 Hypothyroidism, unspecified: Secondary | ICD-10-CM | POA: Diagnosis not present

## 2023-04-27 DIAGNOSIS — N186 End stage renal disease: Secondary | ICD-10-CM | POA: Diagnosis not present

## 2023-04-27 DIAGNOSIS — I279 Pulmonary heart disease, unspecified: Secondary | ICD-10-CM | POA: Diagnosis not present

## 2023-04-27 DIAGNOSIS — I255 Ischemic cardiomyopathy: Secondary | ICD-10-CM | POA: Diagnosis not present

## 2023-04-27 DIAGNOSIS — D631 Anemia in chronic kidney disease: Secondary | ICD-10-CM | POA: Diagnosis not present

## 2023-04-27 DIAGNOSIS — E1122 Type 2 diabetes mellitus with diabetic chronic kidney disease: Secondary | ICD-10-CM | POA: Diagnosis not present

## 2023-04-27 DIAGNOSIS — I120 Hypertensive chronic kidney disease with stage 5 chronic kidney disease or end stage renal disease: Secondary | ICD-10-CM | POA: Diagnosis not present

## 2023-04-27 DIAGNOSIS — G9341 Metabolic encephalopathy: Secondary | ICD-10-CM | POA: Diagnosis not present

## 2023-04-27 DIAGNOSIS — E785 Hyperlipidemia, unspecified: Secondary | ICD-10-CM | POA: Diagnosis not present

## 2023-04-28 DIAGNOSIS — G9341 Metabolic encephalopathy: Secondary | ICD-10-CM | POA: Diagnosis not present

## 2023-04-28 DIAGNOSIS — I279 Pulmonary heart disease, unspecified: Secondary | ICD-10-CM | POA: Diagnosis not present

## 2023-04-28 DIAGNOSIS — Z992 Dependence on renal dialysis: Secondary | ICD-10-CM | POA: Diagnosis not present

## 2023-04-28 DIAGNOSIS — N2581 Secondary hyperparathyroidism of renal origin: Secondary | ICD-10-CM | POA: Diagnosis not present

## 2023-04-28 DIAGNOSIS — I255 Ischemic cardiomyopathy: Secondary | ICD-10-CM | POA: Diagnosis not present

## 2023-04-28 DIAGNOSIS — N186 End stage renal disease: Secondary | ICD-10-CM | POA: Diagnosis not present

## 2023-04-28 DIAGNOSIS — I120 Hypertensive chronic kidney disease with stage 5 chronic kidney disease or end stage renal disease: Secondary | ICD-10-CM | POA: Diagnosis not present

## 2023-04-28 DIAGNOSIS — E1122 Type 2 diabetes mellitus with diabetic chronic kidney disease: Secondary | ICD-10-CM | POA: Diagnosis not present

## 2023-04-28 DIAGNOSIS — D631 Anemia in chronic kidney disease: Secondary | ICD-10-CM | POA: Diagnosis not present

## 2023-04-28 DIAGNOSIS — E785 Hyperlipidemia, unspecified: Secondary | ICD-10-CM | POA: Diagnosis not present

## 2023-04-28 DIAGNOSIS — E039 Hypothyroidism, unspecified: Secondary | ICD-10-CM | POA: Diagnosis not present

## 2023-05-01 DIAGNOSIS — N2581 Secondary hyperparathyroidism of renal origin: Secondary | ICD-10-CM | POA: Diagnosis not present

## 2023-05-01 DIAGNOSIS — Z992 Dependence on renal dialysis: Secondary | ICD-10-CM | POA: Diagnosis not present

## 2023-05-01 DIAGNOSIS — N186 End stage renal disease: Secondary | ICD-10-CM | POA: Diagnosis not present

## 2023-05-02 DIAGNOSIS — I120 Hypertensive chronic kidney disease with stage 5 chronic kidney disease or end stage renal disease: Secondary | ICD-10-CM | POA: Diagnosis not present

## 2023-05-02 DIAGNOSIS — E785 Hyperlipidemia, unspecified: Secondary | ICD-10-CM | POA: Diagnosis not present

## 2023-05-02 DIAGNOSIS — D631 Anemia in chronic kidney disease: Secondary | ICD-10-CM | POA: Diagnosis not present

## 2023-05-02 DIAGNOSIS — N186 End stage renal disease: Secondary | ICD-10-CM | POA: Diagnosis not present

## 2023-05-02 DIAGNOSIS — G9341 Metabolic encephalopathy: Secondary | ICD-10-CM | POA: Diagnosis not present

## 2023-05-02 DIAGNOSIS — I255 Ischemic cardiomyopathy: Secondary | ICD-10-CM | POA: Diagnosis not present

## 2023-05-02 DIAGNOSIS — I279 Pulmonary heart disease, unspecified: Secondary | ICD-10-CM | POA: Diagnosis not present

## 2023-05-02 DIAGNOSIS — E039 Hypothyroidism, unspecified: Secondary | ICD-10-CM | POA: Diagnosis not present

## 2023-05-02 DIAGNOSIS — E1122 Type 2 diabetes mellitus with diabetic chronic kidney disease: Secondary | ICD-10-CM | POA: Diagnosis not present

## 2023-05-03 DIAGNOSIS — N186 End stage renal disease: Secondary | ICD-10-CM | POA: Diagnosis not present

## 2023-05-03 DIAGNOSIS — N2581 Secondary hyperparathyroidism of renal origin: Secondary | ICD-10-CM | POA: Diagnosis not present

## 2023-05-03 DIAGNOSIS — Z992 Dependence on renal dialysis: Secondary | ICD-10-CM | POA: Diagnosis not present

## 2023-05-04 ENCOUNTER — Ambulatory Visit: Payer: Medicare HMO | Admitting: Infectious Disease

## 2023-05-04 NOTE — Progress Notes (Deleted)
Subjective:    Patient ID: [name] [name], male    DOB: 01-Apr-1961, 62 y.o.   MRN: [id number]  HPI   Jasan Potempa is a 62 y.o. male with admitted with MRSA in 2/2 blood cultures on 03/06/23, and E faecalis in 1/2 same blood cultures, then MRSA 2/2 but no E faecalis on 2nd set prior to effective antibiotics then found to have infection status post removal of AV graft last week and placement of temporary HD catheter.   Yesterday when plans were made to place new HD catheter TEE was performed which showed endocarditis involving the mitral valve with a small linear density on the lateral commissure and mild thickening of the anterior and posterior mitral valve as well as a quite large 6.4 x 2.8 mm density attached to the HD catheter tip.    Past Medical History:  Diagnosis Date   Anemia    Arthritis    Cardiomyopathy [facility])    July 2022   Diabetes mellitus (HCC)    GERD (gastroesophageal reflux disease)    HTN (hypertension)    Hyperlipidemia    Nephrotic syndrome    Obesity    Pulmonary hypertension (HCC)    July 2022 in setting of nephrotic syndrome   Secondary hyperparathyroidism of renal origin [facility])     Past Surgical History:  Procedure Laterality Date   AV FISTULA PLACEMENT Left 12/27/2022   Procedure: INSERTION OF LEFT ARM ARTERIOVENOUS (AV) GORE-TEX GRAFT;  Surgeon: Chuck Hint, MD;  Location: [facility] OR;  Service: Vascular;  Laterality: Left;   AVGG REMOVAL Left 03/15/2023   Procedure: REMOVAL OF LEFT UPPER ARM ARTERIOVENOUS GORETEX GRAFT (AVGG);  Surgeon: [name], MD;  Location: [facility] OR;  Service: Vascular;  Laterality: Left;   BIOPSY  01/13/2023   Procedure: BIOPSY;  Surgeon: Franky Macho, MD;  [facility];  Service: Endoscopy;;   CATARACT EXTRACTION, BILATERAL  2018   COLONOSCOPY WITH PROPOFOL N/A 01/13/2023   Procedure: COLONOSCOPY WITH PROPOFOL;  Surgeon: Franky Macho, MD;  [facility];  Service: Endoscopy;  Laterality: N/A;    INSERTION OF DIALYSIS CATHETER Right 12/27/2022   Procedure: INSERTION OF Right Internal Jugular  DIALYSIS CATHETER;  Surgeon: Chuck Hint, MD;  Location: [facility] OR;  Service: Vascular;  Laterality: Right;   INSERTION OF DIALYSIS CATHETER N/A 03/15/2023   Procedure: INSERTION OF TUNNELED DIALYSIS CATHETER;  Surgeon: [name], MD;  Location: [facility] OR;  Service: Vascular;  Laterality: N/A;   INSERTION OF DIALYSIS CATHETER Right 03/25/2023   Procedure: INSERTION OF RIGHT TUNNELED DIALYSIS CATHETER;  Surgeon: Daria Pastures, MD;  Location: [facility] OR;  Service: Vascular;  Laterality: Right;   TEE WITHOUT CARDIOVERSION N/A 03/20/2023   Procedure: TRANSESOPHAGEAL ECHOCARDIOGRAM;  Surgeon: Sande Rives, MD;  Location: [facility] OR;  Service: Cardiovascular;  Laterality: N/A;   TONSILLECTOMY      Family History  Problem Relation Age of Onset   Hypertension Mother    Diabetes Mother    Cancer Father        prostate cancer   Hypertension Maternal Grandfather    Rheum arthritis Maternal Grandmother    CAD Neg Hx       Social History   Socioeconomic History   Marital status: Single    Spouse name: Not on file   Number of children: Not on file   Years of education: Not on file   Highest education level: Not on file  Occupational History  Not on file  Tobacco Use   Smoking status: Never   Smokeless tobacco: Never  Vaping Use   Vaping status: Never Used  Substance and Sexual Activity   Alcohol use: Never   Drug use: Never   Sexual activity: Not on file  Other Topics Concern   Not on file  Social History Narrative   Not on file   Social Determinants of Health   Financial Resource Strain: Not on file  Food Insecurity: No Food Insecurity (03/06/2023)   Hunger Vital Sign    Worried About Running Out of Food in the Last Year: Never true    Ran Out of Food in the Last Year: Never true  Transportation Needs: No Transportation Needs (03/06/2023)   PRAPARE - Therapist, art (Medical): No    Lack of Transportation (Non-Medical): No  Physical Activity: Not on file  Stress: Not on file  Social Connections: Not on file    No Known Allergies   Current Outpatient Medications:    Accu-Chek Softclix Lancets lancets, , Disp: , Rfl:    acetaminophen (TYLENOL) 500 MG tablet, Take 500 mg by mouth every 6 (six) hours as needed (arthritis). , Disp: , Rfl:    amLODipine (NORVASC) 10 MG tablet, Take 1 tablet (10 mg total) by mouth every morning., Disp: 30 tablet, Rfl: 0   atorvastatin (LIPITOR) 20 MG tablet, Take 20 mg by mouth every morning., Disp: , Rfl:    Blood Glucose Monitoring Suppl (ACCU-CHEK GUIDE) w/Device KIT, in the morning., Disp: , Rfl:    hydrALAZINE (APRESOLINE) 10 MG tablet, Take 1 tablet (10 mg total) by mouth every 8 (eight) hours., Disp: 90 tablet, Rfl: 0   levothyroxine (SYNTHROID) 25 MCG tablet, Take 1 tablet (25 mcg total) by mouth daily before breakfast., Disp: 30 tablet, Rfl: 0   metoprolol tartrate (LOPRESSOR) 50 MG tablet, Take 1 tablet (50 mg total) by mouth 2 (two) times daily., Disp: 60 tablet, Rfl: 0   Multiple Vitamin (MULTIVITAMIN WITH MINERALS) TABS tablet, Take 1 tablet by mouth daily., Disp: 30 tablet, Rfl: 0   pantoprazole (PROTONIX) 40 MG tablet, Take 1 tablet (40 mg total) by mouth daily., Disp: 30 tablet, Rfl: 1   polyethylene glycol (MIRALAX / GLYCOLAX) 17 g packet, Take 17 g by mouth daily., Disp: 14 each, Rfl: 0   vancomycin IVPB, Inject 750 mg into the vein every Monday, Wednesday, and Friday with hemodialysis. Indication:  MRSA MV IE Last Day of Therapy:  05/02/23 Labs - Once weekly: ESR and CRP, CBC/D, BMP pre-HD Vancomycin level Method of administration: Per hemodialysis protocol - to be given at HD center, Disp: , Rfl:     Review of Systems     Objective:   Physical Exam        Assessment & Plan:

## 2023-05-05 ENCOUNTER — Other Ambulatory Visit: Payer: Self-pay

## 2023-05-05 DIAGNOSIS — Z992 Dependence on renal dialysis: Secondary | ICD-10-CM

## 2023-05-05 DIAGNOSIS — N2581 Secondary hyperparathyroidism of renal origin: Secondary | ICD-10-CM | POA: Diagnosis not present

## 2023-05-05 DIAGNOSIS — N186 End stage renal disease: Secondary | ICD-10-CM | POA: Diagnosis not present

## 2023-05-08 DIAGNOSIS — D631 Anemia in chronic kidney disease: Secondary | ICD-10-CM | POA: Diagnosis not present

## 2023-05-08 DIAGNOSIS — E1122 Type 2 diabetes mellitus with diabetic chronic kidney disease: Secondary | ICD-10-CM | POA: Diagnosis not present

## 2023-05-08 DIAGNOSIS — G9341 Metabolic encephalopathy: Secondary | ICD-10-CM | POA: Diagnosis not present

## 2023-05-08 DIAGNOSIS — I279 Pulmonary heart disease, unspecified: Secondary | ICD-10-CM | POA: Diagnosis not present

## 2023-05-08 DIAGNOSIS — I255 Ischemic cardiomyopathy: Secondary | ICD-10-CM | POA: Diagnosis not present

## 2023-05-08 DIAGNOSIS — N186 End stage renal disease: Secondary | ICD-10-CM | POA: Diagnosis not present

## 2023-05-08 DIAGNOSIS — E785 Hyperlipidemia, unspecified: Secondary | ICD-10-CM | POA: Diagnosis not present

## 2023-05-08 DIAGNOSIS — E039 Hypothyroidism, unspecified: Secondary | ICD-10-CM | POA: Diagnosis not present

## 2023-05-08 DIAGNOSIS — I120 Hypertensive chronic kidney disease with stage 5 chronic kidney disease or end stage renal disease: Secondary | ICD-10-CM | POA: Diagnosis not present

## 2023-05-09 DIAGNOSIS — N2581 Secondary hyperparathyroidism of renal origin: Secondary | ICD-10-CM | POA: Diagnosis not present

## 2023-05-09 DIAGNOSIS — N186 End stage renal disease: Secondary | ICD-10-CM | POA: Diagnosis not present

## 2023-05-09 DIAGNOSIS — Z992 Dependence on renal dialysis: Secondary | ICD-10-CM | POA: Diagnosis not present

## 2023-05-10 ENCOUNTER — Telehealth (INDEPENDENT_AMBULATORY_CARE_PROVIDER_SITE_OTHER): Payer: Self-pay | Admitting: Gastroenterology

## 2023-05-10 NOTE — Telephone Encounter (Signed)
Consent form for CT completed by Dr.Ahmed. Pt was currently in [facility]. Contacted [facility], pt no longer a patient there. Contacted Mom Doristine Counter but had to leave message to return call. Need to schedule CT scan for pt.

## 2023-05-12 DIAGNOSIS — Z992 Dependence on renal dialysis: Secondary | ICD-10-CM | POA: Diagnosis not present

## 2023-05-12 DIAGNOSIS — N2581 Secondary hyperparathyroidism of renal origin: Secondary | ICD-10-CM | POA: Diagnosis not present

## 2023-05-12 DIAGNOSIS — N186 End stage renal disease: Secondary | ICD-10-CM | POA: Diagnosis not present

## 2023-05-13 DIAGNOSIS — N186 End stage renal disease: Secondary | ICD-10-CM | POA: Diagnosis not present

## 2023-05-13 DIAGNOSIS — Z992 Dependence on renal dialysis: Secondary | ICD-10-CM | POA: Diagnosis not present

## 2023-05-13 DIAGNOSIS — E1122 Type 2 diabetes mellitus with diabetic chronic kidney disease: Secondary | ICD-10-CM | POA: Diagnosis not present

## 2023-05-17 NOTE — Telephone Encounter (Signed)
Left message to return call 

## 2023-05-17 NOTE — Telephone Encounter (Signed)
[name] returned call. [name] [name] states that pt is now having to do therapy and has a lot of nurses working with him. They are going to see the doctor "that took care of him when he had the blood infection" tomorrow. Pt mom states she is having a hard time with him at this time with all the appts and such. Mother would like to get the other appts taken care of and then she will call for an appointment with Dr.Ahmed to discuss pt diarrhea.

## 2023-05-18 ENCOUNTER — Ambulatory Visit: Payer: Medicare HMO | Admitting: Infectious Disease

## 2023-05-18 ENCOUNTER — Telehealth: Payer: Self-pay

## 2023-05-18 ENCOUNTER — Other Ambulatory Visit: Payer: Self-pay

## 2023-05-18 VITALS — BP 172/87 | HR 59 | Temp 97.4°F

## 2023-05-18 DIAGNOSIS — N186 End stage renal disease: Secondary | ICD-10-CM

## 2023-05-18 DIAGNOSIS — Z23 Encounter for immunization: Secondary | ICD-10-CM

## 2023-05-18 DIAGNOSIS — R7881 Bacteremia: Secondary | ICD-10-CM | POA: Diagnosis not present

## 2023-05-18 DIAGNOSIS — I059 Rheumatic mitral valve disease, unspecified: Secondary | ICD-10-CM

## 2023-05-18 DIAGNOSIS — Z992 Dependence on renal dialysis: Secondary | ICD-10-CM

## 2023-05-18 DIAGNOSIS — Z7185 Encounter for immunization safety counseling: Secondary | ICD-10-CM

## 2023-05-18 DIAGNOSIS — U071 COVID-19: Secondary | ICD-10-CM | POA: Diagnosis not present

## 2023-05-18 DIAGNOSIS — B9562 Methicillin resistant Staphylococcus aureus infection as the cause of diseases classified elsewhere: Secondary | ICD-10-CM

## 2023-05-18 DIAGNOSIS — T827XXD Infection and inflammatory reaction due to other cardiac and vascular devices, implants and grafts, subsequent encounter: Secondary | ICD-10-CM

## 2023-05-18 NOTE — Telephone Encounter (Signed)
Called [employer] to initiate PA for Echocardiogram Complete.No PA is needed.  Patient's mother will call [facility] echo lab to schedule appointment to prevent double booking patient and herself.  Juanita Laster, RMA

## 2023-05-18 NOTE — Progress Notes (Signed)
Subjective:  Complaint follow-up for MRSA and Enterococcus faecalis bacteremia endocarditis and HD catheter infection   Patient ID: [name] [name], male    DOB: 1960/08/27, 62 y.o.   MRN: [id number]  HPI  62 y.o. male with admitted with COVID 19 infection,  MRSA in 2/2 blood cultures on 03/06/23, and E faecalis in 1/2 same blood cultures, then MRSA 2/2 but no E faecalis on 2nd set prior to effective antibiotics then found to have infection status post removal of AV graft last week and placement of temporary HD catheter.   TEE then was performed which showed endocarditis involving the mitral valve with a small linear density on the lateral commissure and mild thickening of the anterior and posterior mitral valve as well as a quite large 6.4 x 2.8 mm density attached to the HD catheter tip.   His HD catheter temporary was then  removed    HD catheter holiday prior to placement of a permanent HD catheter.   Blood cultures been taken after HD catheter was removed today.   We gave  vancomycin alone for 6 weeks. Note   as far as the enterococcus is concerned I felt we have for sufficient amoutn of data to put the weight of our concern on the MRSA as the primary if not exclusive intravscular pathogen.  The Enterococcus is certainly covered by vancomycin but if we truly thought that the patient had primarily an enterococcal endocarditis we would typically treat with dual beta-lactam therapy.  I did  not think that is necessary in this case.   Past Medical History:  Diagnosis Date   Anemia    Arthritis    Cardiomyopathy [facility])    July 2022   Diabetes mellitus (HCC)    GERD (gastroesophageal reflux disease)    HTN (hypertension)    Hyperlipidemia    Nephrotic syndrome    Obesity    Pulmonary hypertension (HCC)    July 2022 in setting of nephrotic syndrome   Secondary hyperparathyroidism of renal origin [facility])     Past Surgical History:  Procedure Laterality Date   AV FISTULA PLACEMENT Left  12/27/2022   Procedure: INSERTION OF LEFT ARM ARTERIOVENOUS (AV) GORE-TEX GRAFT;  Surgeon: Chuck Hint, MD;  Location: [facility] OR;  Service: Vascular;  Laterality: Left;   AVGG REMOVAL Left 03/15/2023   Procedure: REMOVAL OF LEFT UPPER ARM ARTERIOVENOUS GORETEX GRAFT (AVGG);  Surgeon: [name], MD;  Location: [facility] OR;  Service: Vascular;  Laterality: Left;   BIOPSY  01/13/2023   Procedure: BIOPSY;  Surgeon: Franky Macho, MD;  [facility];  Service: Endoscopy;;   CATARACT EXTRACTION, BILATERAL  2018   COLONOSCOPY WITH PROPOFOL N/A 01/13/2023   Procedure: COLONOSCOPY WITH PROPOFOL;  Surgeon: Franky Macho, MD;  [facility];  Service: Endoscopy;  Laterality: N/A;   INSERTION OF DIALYSIS CATHETER Right 12/27/2022   Procedure: INSERTION OF Right Internal Jugular  DIALYSIS CATHETER;  Surgeon: Chuck Hint, MD;  Location: [facility] OR;  Service: Vascular;  Laterality: Right;   INSERTION OF DIALYSIS CATHETER N/A 03/15/2023   Procedure: INSERTION OF TUNNELED DIALYSIS CATHETER;  Surgeon: [name], MD;  Location: [facility] OR;  Service: Vascular;  Laterality: N/A;   INSERTION OF DIALYSIS CATHETER Right 03/25/2023   Procedure: INSERTION OF RIGHT TUNNELED DIALYSIS CATHETER;  Surgeon: Daria Pastures, MD;  Location: [facility] OR;  Service: Vascular;  Laterality: Right;   TEE WITHOUT CARDIOVERSION N/A 03/20/2023   Procedure: TRANSESOPHAGEAL ECHOCARDIOGRAM;  Surgeon: Sande Rives, MD;  [facility];  Service: Cardiovascular;  Laterality: N/A;   TONSILLECTOMY      Family History  Problem Relation Age of Onset   Hypertension Mother    Diabetes Mother    Cancer Father        prostate cancer   Hypertension Maternal Grandfather    Rheum arthritis Maternal Grandmother    CAD Neg Hx       Social History   Socioeconomic History   Marital status: Single    Spouse name: Not on file   Number of children: Not on file   Years of education: Not on file   Highest  education level: Not on file  Occupational History   Not on file  Tobacco Use   Smoking status: Never   Smokeless tobacco: Never  Vaping Use   Vaping status: Never Used  Substance and Sexual Activity   Alcohol use: Never   Drug use: Never   Sexual activity: Not on file  Other Topics Concern   Not on file  Social History Narrative   Not on file   Social Determinants of Health   Financial Resource Strain: Not on file  Food Insecurity: No Food Insecurity (03/06/2023)   Hunger Vital Sign    Worried About Running Out of Food in the Last Year: Never true    Ran Out of Food in the Last Year: Never true  Transportation Needs: No Transportation Needs (03/06/2023)   PRAPARE - Administrator, Civil Service (Medical): No    Lack of Transportation (Non-Medical): No  Physical Activity: Not on file  Stress: Not on file  Social Connections: Not on file    No Known Allergies   Current Outpatient Medications:    Accu-Chek Softclix Lancets lancets, , Disp: , Rfl:    acetaminophen (TYLENOL) 500 MG tablet, Take 500 mg by mouth every 6 (six) hours as needed (arthritis). , Disp: , Rfl:    amLODipine (NORVASC) 10 MG tablet, Take 1 tablet (10 mg total) by mouth every morning., Disp: 30 tablet, Rfl: 0   atorvastatin (LIPITOR) 20 MG tablet, Take 20 mg by mouth every morning., Disp: , Rfl:    Blood Glucose Monitoring Suppl (ACCU-CHEK GUIDE) w/Device KIT, in the morning., Disp: , Rfl:    hydrALAZINE (APRESOLINE) 10 MG tablet, Take 1 tablet (10 mg total) by mouth every 8 (eight) hours., Disp: 90 tablet, Rfl: 0   levothyroxine (SYNTHROID) 25 MCG tablet, Take 1 tablet (25 mcg total) by mouth daily before breakfast., Disp: 30 tablet, Rfl: 0   metoprolol tartrate (LOPRESSOR) 50 MG tablet, Take 1 tablet (50 mg total) by mouth 2 (two) times daily., Disp: 60 tablet, Rfl: 0   Multiple Vitamin (MULTIVITAMIN WITH MINERALS) TABS tablet, Take 1 tablet by mouth daily., Disp: 30 tablet, Rfl: 0    pantoprazole (PROTONIX) 40 MG tablet, Take 1 tablet (40 mg total) by mouth daily., Disp: 30 tablet, Rfl: 1   polyethylene glycol (MIRALAX / GLYCOLAX) 17 g packet, Take 17 g by mouth daily., Disp: 14 each, Rfl: 0   Review of Systems  Constitutional:  Positive for fatigue. Negative for activity change, appetite change, chills, diaphoresis, fever and unexpected weight change.  HENT:  Negative for congestion, rhinorrhea, sinus pressure, sneezing, sore throat and trouble swallowing.   Eyes:  Negative for photophobia and visual disturbance.  Respiratory:  Negative for cough, chest tightness, shortness of breath, wheezing and stridor.   Cardiovascular:  Negative for chest  pain, palpitations and leg swelling.  Gastrointestinal:  Negative for abdominal distention, abdominal pain, anal bleeding, blood in stool, constipation, diarrhea, nausea and vomiting.  Genitourinary:  Negative for difficulty urinating, dysuria, flank pain and hematuria.  Musculoskeletal:  Negative for arthralgias, back pain, gait problem, joint swelling and myalgias.  Skin:  Negative for color change, pallor, rash and wound.  Neurological:  Negative for dizziness, tremors, weakness and light-headedness.  Hematological:  Negative for adenopathy. Does not bruise/bleed easily.  Psychiatric/Behavioral:  Negative for agitation, behavioral problems, confusion, decreased concentration, dysphoric mood and sleep disturbance. The patient is nervous/anxious.        Objective:   Physical Exam Constitutional:      Appearance: He is well-developed.  HENT:     Head: Normocephalic and atraumatic.  Eyes:     Conjunctiva/sclera: Conjunctivae normal.  Cardiovascular:     Rate and Rhythm: Normal rate and regular rhythm.     Heart sounds: No murmur heard.    No friction rub. No gallop.  Pulmonary:     Effort: Pulmonary effort is normal. No respiratory distress.     Breath sounds: No stridor. No wheezing.  Abdominal:     General: There is no  distension.     Palpations: Abdomen is soft.  Musculoskeletal:        General: No tenderness. Normal range of motion.     Cervical back: Normal range of motion and neck supple.  Skin:    General: Skin is warm and dry.     Coloration: Skin is not pale.     Findings: No erythema or rash.  Neurological:     General: No focal deficit present.     Mental Status: He is alert and oriented to person, place, and time.  Psychiatric:        Mood and Affect: Mood normal.        Behavior: Behavior normal.        Thought Content: Thought content normal.        Judgment: Judgment normal.           Assessment & Plan:   MRSA bacteremia with Enterococcus faecalis also isolated on culture mitral valve endocarditis and HD catheter infection, status post HD catheter removal and line holiday.  He has received 6 weeks of IV vancomycin.  Will check surveillance blood cultures today I am also ordering a repeat 2D echocardiogram to follow-up with findings found on his prior echocardiogram.  I do not see that he has follow-up with cardiology but think this would be a good idea.  COVID-19 infection this is resolved.  :ESRD on HD: his 39 year old Mom who is transporting him to HD and all of his appts is having signficant caregiver fatigue  Vaccine counseling: Mended updated flu shot as well as COVID shot although the COVID shot is slightly less than 3 months from his recent COVID infection I think is worthwhile getting it now

## 2023-05-19 ENCOUNTER — Emergency Department (HOSPITAL_COMMUNITY): Payer: Medicare HMO

## 2023-05-19 ENCOUNTER — Encounter (HOSPITAL_COMMUNITY): Payer: Self-pay | Admitting: *Deleted

## 2023-05-19 ENCOUNTER — Observation Stay (HOSPITAL_COMMUNITY)
Admission: EM | Admit: 2023-05-19 | Discharge: 2023-05-20 | Disposition: A | Discharge status: Home / Self Care | Payer: Medicare HMO | Attending: Family Medicine | Admitting: Family Medicine

## 2023-05-19 ENCOUNTER — Other Ambulatory Visit: Payer: Self-pay

## 2023-05-19 DIAGNOSIS — R197 Diarrhea, unspecified: Secondary | ICD-10-CM | POA: Diagnosis not present

## 2023-05-19 DIAGNOSIS — I059 Rheumatic mitral valve disease, unspecified: Secondary | ICD-10-CM | POA: Diagnosis present

## 2023-05-19 DIAGNOSIS — E1122 Type 2 diabetes mellitus with diabetic chronic kidney disease: Secondary | ICD-10-CM | POA: Insufficient documentation

## 2023-05-19 DIAGNOSIS — E875 Hyperkalemia: Secondary | ICD-10-CM | POA: Diagnosis not present

## 2023-05-19 DIAGNOSIS — R778 Other specified abnormalities of plasma proteins: Secondary | ICD-10-CM | POA: Insufficient documentation

## 2023-05-19 DIAGNOSIS — I420 Dilated cardiomyopathy: Secondary | ICD-10-CM | POA: Diagnosis present

## 2023-05-19 DIAGNOSIS — Z992 Dependence on renal dialysis: Secondary | ICD-10-CM | POA: Diagnosis not present

## 2023-05-19 DIAGNOSIS — R2689 Other abnormalities of gait and mobility: Secondary | ICD-10-CM | POA: Diagnosis not present

## 2023-05-19 DIAGNOSIS — E785 Hyperlipidemia, unspecified: Secondary | ICD-10-CM | POA: Diagnosis not present

## 2023-05-19 DIAGNOSIS — I120 Hypertensive chronic kidney disease with stage 5 chronic kidney disease or end stage renal disease: Secondary | ICD-10-CM | POA: Insufficient documentation

## 2023-05-19 DIAGNOSIS — R531 Weakness: Principal | ICD-10-CM | POA: Insufficient documentation

## 2023-05-19 DIAGNOSIS — N186 End stage renal disease: Secondary | ICD-10-CM | POA: Diagnosis not present

## 2023-05-19 DIAGNOSIS — R2681 Unsteadiness on feet: Secondary | ICD-10-CM | POA: Diagnosis not present

## 2023-05-19 DIAGNOSIS — E1169 Type 2 diabetes mellitus with other specified complication: Secondary | ICD-10-CM | POA: Diagnosis present

## 2023-05-19 DIAGNOSIS — D631 Anemia in chronic kidney disease: Secondary | ICD-10-CM | POA: Diagnosis present

## 2023-05-19 DIAGNOSIS — R0902 Hypoxemia: Secondary | ICD-10-CM | POA: Diagnosis not present

## 2023-05-19 DIAGNOSIS — Z1152 Encounter for screening for COVID-19: Secondary | ICD-10-CM | POA: Insufficient documentation

## 2023-05-19 DIAGNOSIS — M6281 Muscle weakness (generalized): Secondary | ICD-10-CM | POA: Diagnosis not present

## 2023-05-19 DIAGNOSIS — I330 Acute and subacute infective endocarditis: Secondary | ICD-10-CM | POA: Diagnosis not present

## 2023-05-19 DIAGNOSIS — Z7951 Long term (current) use of inhaled steroids: Secondary | ICD-10-CM | POA: Diagnosis not present

## 2023-05-19 DIAGNOSIS — I10 Essential (primary) hypertension: Secondary | ICD-10-CM | POA: Diagnosis not present

## 2023-05-19 DIAGNOSIS — N179 Acute kidney failure, unspecified: Secondary | ICD-10-CM | POA: Insufficient documentation

## 2023-05-19 DIAGNOSIS — E039 Hypothyroidism, unspecified: Secondary | ICD-10-CM | POA: Diagnosis present

## 2023-05-19 DIAGNOSIS — E1165 Type 2 diabetes mellitus with hyperglycemia: Secondary | ICD-10-CM | POA: Diagnosis not present

## 2023-05-19 DIAGNOSIS — R0989 Other specified symptoms and signs involving the circulatory and respiratory systems: Secondary | ICD-10-CM | POA: Diagnosis not present

## 2023-05-19 DIAGNOSIS — R918 Other nonspecific abnormal finding of lung field: Secondary | ICD-10-CM | POA: Diagnosis not present

## 2023-05-19 DIAGNOSIS — D638 Anemia in other chronic diseases classified elsewhere: Secondary | ICD-10-CM | POA: Diagnosis present

## 2023-05-19 HISTORY — DX: Dependence on renal dialysis: Z99.2

## 2023-05-19 LAB — CBC WITH DIFFERENTIAL/PLATELET
Abs Immature Granulocytes: 0.04 10*3/uL (ref 0.00–0.07)
Basophils Absolute: 0.1 10*3/uL (ref 0.0–0.1)
Basophils Relative: 1 %
Eosinophils Absolute: 0.6 10*3/uL — ABNORMAL HIGH (ref 0.0–0.5)
Eosinophils Relative: 6 %
HCT: 40.7 % (ref 39.0–52.0)
Hemoglobin: 12.4 g/dL — ABNORMAL LOW (ref 13.0–17.0)
Immature Granulocytes: 1 %
Lymphocytes Relative: 16 %
Lymphs Abs: 1.4 10*3/uL (ref 0.7–4.0)
MCH: 29.4 pg (ref 26.0–34.0)
MCHC: 30.5 g/dL (ref 30.0–36.0)
MCV: 96.4 fL (ref 80.0–100.0)
Monocytes Absolute: 0.4 10*3/uL (ref 0.1–1.0)
Monocytes Relative: 4 %
Neutro Abs: 6.3 10*3/uL (ref 1.7–7.7)
Neutrophils Relative %: 72 %
Platelets: 197 10*3/uL (ref 150–400)
RBC: 4.22 MIL/uL (ref 4.22–5.81)
RDW: 13.6 % (ref 11.5–15.5)
WBC: 8.8 10*3/uL (ref 4.0–10.5)
nRBC: 0 % (ref 0.0–0.2)

## 2023-05-19 LAB — COMPREHENSIVE METABOLIC PANEL
ALT: 24 U/L (ref 0–44)
AST: 17 U/L (ref 15–41)
Albumin: 3.3 g/dL — ABNORMAL LOW (ref 3.5–5.0)
Alkaline Phosphatase: 161 U/L — ABNORMAL HIGH (ref 38–126)
BUN: 96 mg/dL — ABNORMAL HIGH (ref 8–23)
CO2: 13 mmol/L — ABNORMAL LOW (ref 22–32)
Calcium: 8.6 mg/dL — ABNORMAL LOW (ref 8.9–10.3)
Chloride: 109 mmol/L (ref 98–111)
Creatinine, Ser: 7.65 mg/dL — ABNORMAL HIGH (ref 0.61–1.24)
GFR, Estimated: 7 mL/min — ABNORMAL LOW (ref >60)
Glucose, Bld: 93 mg/dL (ref 70–99)
Potassium: >7.5 mmol/L (ref 3.5–5.1)
Sodium: 139 mmol/L (ref 135–145)
Total Bilirubin: 0.8 mg/dL (ref <1.2)
Total Protein: 7 g/dL (ref 6.5–8.1)

## 2023-05-19 LAB — APTT: aPTT: 27 s (ref 24–36)

## 2023-05-19 LAB — C-REACTIVE PROTEIN: CRP: 7.6 mg/dL — ABNORMAL HIGH (ref <1.0)

## 2023-05-19 LAB — LACTIC ACID, PLASMA
Lactic Acid, Venous: 1.1 mmol/L (ref 0.5–1.9)
Lactic Acid, Venous: 1.1 mmol/L (ref 0.5–1.9)

## 2023-05-19 LAB — SEDIMENTATION RATE: Sed Rate: 45 mm/h — ABNORMAL HIGH (ref 0–16)

## 2023-05-19 LAB — TROPONIN I (HIGH SENSITIVITY)
Troponin I (High Sensitivity): 91 ng/L — ABNORMAL HIGH (ref <18)
Troponin I (High Sensitivity): 110 ng/L (ref <18)

## 2023-05-19 LAB — RESP PANEL BY RT-PCR (RSV, FLU A&B, COVID)  RVPGX2
Influenza A by PCR: NEGATIVE
Influenza B by PCR: NEGATIVE
Resp Syncytial Virus by PCR: NEGATIVE
SARS Coronavirus 2 by RT PCR: NEGATIVE

## 2023-05-19 LAB — PROTIME-INR
INR: 1.1 (ref 0.8–1.2)
Prothrombin Time: 14.3 s (ref 11.4–15.2)

## 2023-05-19 LAB — MRSA NEXT GEN BY PCR, NASAL: MRSA by PCR Next Gen: DETECTED — AB

## 2023-05-19 LAB — GLUCOSE, CAPILLARY: Glucose-Capillary: 82 mg/dL (ref 70–99)

## 2023-05-19 LAB — HEPATITIS B SURFACE ANTIGEN: Hepatitis B Surface Ag: NONREACTIVE

## 2023-05-19 MED ORDER — HYDRALAZINE HCL 10 MG PO TABS
10.0000 mg | ORAL_TABLET | Freq: Three times a day (TID) | ORAL | Status: DC
  Administered 2023-05-19 – 2023-05-20 (×3): 10 mg via ORAL
  Filled 2023-05-19 (×3): qty 1

## 2023-05-19 MED ORDER — SODIUM CHLORIDE 0.9 % IV SOLN: 500.0000 mg | INTRAVENOUS | Status: DC

## 2023-05-19 MED ORDER — SODIUM BICARBONATE 8.4 % IV SOLN
Freq: Once | INTRAVENOUS | Status: DC
  Filled 2023-05-19: qty 1000

## 2023-05-19 MED ORDER — FUROSEMIDE 10 MG/ML IJ SOLN
INTRAMUSCULAR | Status: AC
  Filled 2023-05-19: qty 20

## 2023-05-19 MED ORDER — SODIUM CHLORIDE 0.9 % IV SOLN: 2.0000 g | Freq: Once | INTRAVENOUS | Status: DC

## 2023-05-19 MED ORDER — FUROSEMIDE 10 MG/ML IJ SOLN: 60.0000 mg | Freq: Once | INTRAMUSCULAR | Status: DC

## 2023-05-19 MED ORDER — LORAZEPAM 2 MG/ML IJ SOLN
1.0000 mg | Freq: Once | INTRAMUSCULAR | Status: AC
  Administered 2023-05-19: 1 mg via INTRAVENOUS
  Filled 2023-05-19: qty 1

## 2023-05-19 MED ORDER — CHLORHEXIDINE GLUCONATE CLOTH 2 % EX PADS: 6.0000 | MEDICATED_PAD | Freq: Every day | CUTANEOUS | Status: DC

## 2023-05-19 MED ORDER — INSULIN ASPART 100 UNIT/ML IV SOLN: 5.0000 [IU] | Freq: Once | INTRAVENOUS | Status: DC

## 2023-05-19 MED ORDER — PANTOPRAZOLE SODIUM 40 MG PO TBEC
40.0000 mg | DELAYED_RELEASE_TABLET | Freq: Every day | ORAL | Status: DC
  Administered 2023-05-19: 40 mg via ORAL
  Filled 2023-05-19: qty 1

## 2023-05-19 MED ORDER — CALCIUM GLUCONATE 10 % IV SOLN: 1.0000 g | Freq: Once | INTRAVENOUS | Status: DC

## 2023-05-19 MED ORDER — SODIUM ZIRCONIUM CYCLOSILICATE 5 G PO PACK
10.0000 g | PACK | ORAL | Status: AC
  Administered 2023-05-19: 10 g via ORAL
  Filled 2023-05-19: qty 2

## 2023-05-19 MED ORDER — SODIUM CHLORIDE 0.9 % IV SOLN
2.0000 g | Freq: Once | INTRAVENOUS | Status: AC
  Administered 2023-05-19: 2 g via INTRAVENOUS
  Filled 2023-05-19: qty 20

## 2023-05-19 MED ORDER — AMLODIPINE BESYLATE 5 MG PO TABS
10.0000 mg | ORAL_TABLET | Freq: Every morning | ORAL | Status: DC
  Administered 2023-05-20: 10 mg via ORAL
  Filled 2023-05-19: qty 2

## 2023-05-19 MED ORDER — LEVOTHYROXINE SODIUM 25 MCG PO TABS
25.0000 ug | ORAL_TABLET | Freq: Every day | ORAL | Status: DC
  Administered 2023-05-20: 25 ug via ORAL
  Filled 2023-05-19: qty 0.5
  Filled 2023-05-19: qty 1
  Filled 2023-05-19 (×2): qty 0.5

## 2023-05-19 MED ORDER — HEPARIN SODIUM (PORCINE) 5000 UNIT/ML IJ SOLN: 5000.0000 [IU] | Freq: Three times a day (TID) | INTRAMUSCULAR | Status: DC

## 2023-05-19 MED ORDER — ACETAMINOPHEN 325 MG PO TABS: 650.0000 mg | ORAL_TABLET | Freq: Four times a day (QID) | ORAL | Status: DC | PRN

## 2023-05-19 MED ORDER — METOPROLOL TARTRATE 50 MG PO TABS
50.0000 mg | ORAL_TABLET | Freq: Two times a day (BID) | ORAL | Status: DC
  Administered 2023-05-19 – 2023-05-20 (×2): 50 mg via ORAL
  Filled 2023-05-19 (×2): qty 1

## 2023-05-19 MED ORDER — ATORVASTATIN CALCIUM 10 MG PO TABS
20.0000 mg | ORAL_TABLET | Freq: Every morning | ORAL | Status: DC
  Administered 2023-05-20: 20 mg via ORAL
  Filled 2023-05-19: qty 2

## 2023-05-19 MED ORDER — FUROSEMIDE 10 MG/ML IJ SOLN
120.0000 mg | Freq: Once | INTRAVENOUS | Status: DC
  Administered 2023-05-19: 120 mg via INTRAVENOUS
  Filled 2023-05-19: qty 12

## 2023-05-19 MED ORDER — DEXTROSE 50 % IV SOLN: 1.0000 | Freq: Once | INTRAVENOUS | Status: DC

## 2023-05-19 MED ORDER — ALBUTEROL SULFATE (2.5 MG/3ML) 0.083% IN NEBU
15.0000 mg | INHALATION_SOLUTION | Freq: Once | RESPIRATORY_TRACT | Status: DC
  Filled 2023-05-19 (×2): qty 18

## 2023-05-19 MED ORDER — ACETAMINOPHEN 650 MG RE SUPP: 650.0000 mg | Freq: Four times a day (QID) | RECTAL | Status: DC | PRN

## 2023-05-19 MED ORDER — SODIUM CHLORIDE 0.9 % IV SOLN
500.0000 mg | INTRAVENOUS | Status: AC
  Administered 2023-05-19: 500 mg via INTRAVENOUS
  Filled 2023-05-19: qty 5

## 2023-05-19 MED ORDER — NEPRO/CARBSTEADY PO LIQD
237.0000 mL | Freq: Two times a day (BID) | ORAL | Status: DC
  Administered 2023-05-20: 237 mL via ORAL

## 2023-05-19 MED ORDER — FUROSEMIDE 10 MG/ML IJ SOLN
120.0000 mg | Freq: Once | INTRAMUSCULAR | Status: DC
  Filled 2023-05-19: qty 12

## 2023-05-19 NOTE — Progress Notes (Signed)
Pt goal met. Patient had agitate and restless during tx. Ativan 1 mg iv given. Dr. Ronalee Belts notified.  05/19/23 2046  Vitals  Temp 97.8 F (36.6 C)  Temp Source Oral  BP (!) 170/99  BP Location Right Arm  BP Method Automatic  Patient Position (if appropriate) Lying  Oxygen Therapy  SpO2 95 %  O2 Device Room Air  During Treatment Monitoring  Intra-Hemodialysis Comments Tx completed  Post Treatment  Dialyzer Clearance Lightly streaked  Hemodialysis Intake (mL) 0 mL  Liters Processed 82  Fluid Removed (mL) 2000 mL  Tolerated HD Treatment Yes  Post-Hemodialysis Comments see notes

## 2023-05-19 NOTE — ED Triage Notes (Signed)
Pt brought in by EMS from home with c/o weakness over the last week. Pt reports he's feel so weak that he hasn't went to dialysis over the past week, last session Fri 11/29.

## 2023-05-19 NOTE — Progress Notes (Signed)
Nephrology note: 62 year old male ESRD on HD, recent hospital admission for bacteremia ended and endocarditis, treated with antibiotics presented with generalized weakness for several days to week.  He has missed HD for a week, last dialysis was on 11/29.  The labs showed potassium level more than 7.5, BUN 96, hemoglobin 12.4.  Chest x-ray with no overt pulm edema.  He is afebrile, BP elevated and using around 2 L of oxygen.  OP HD orders: [facility], MWF, last HD on 11/29, EDW 59 kg, 2k 2.5 ca, RIJ TDC Mircera 200 mcg on 10/30.  Plan for urgent dialysis today because of severe hyperkalemia.  HD with low potassium bath.  I have discussed with dialysis nurse, ED physician and primary admitting team.  He is well-known to me from outpatient dialysis center.  Crista Elliot,  [employer].

## 2023-05-19 NOTE — H&P (Signed)
TRH H&P   Patient Demographics:    [name] [name], is a 62 y.o. male  MRN: [id number]   DOB - 1960-12-31  Admit Date - 05/19/2023  Outpatient Primary MD for the patient is Farris Has, MD  Referring MD/NP/PA: Dr Eloise Harman  Patient coming from: home  Chief Complaint  Patient presents with   Weakness      HPI:    [name] [name]  is a 62 y.o. male, with past medical history significant for ESRD on hemodialysis MWF, hypertension, diabetes type 2, pulmonary hypertension, cardiomyopathy, GERD, dyslipidemia, anemia of CKD, with recent hospitalization secondary to COVID-pneumonia, and MRSA and enterococcal bacteremia.  Mitral valve endocarditis, he finished his IV vancomycin course on 05/02/2023.  -Patient was brought with his wife to ED secondary to weakness, wife report patient has been having diarrhea for the last week, poor appetite, poor oral intake, generalized weakness and deconditioning, last time he had his dialysis 05/12/2023, have any further dialysis since, has any chest pain, fever, chills, shortness of breath. -In ED his labs were significant for elevated potassium> 7.5, chest x-ray significant for volume overload versus opacity, EKG with peaked T waves, troponins elevated at 110, he denies any chest pain or shortness of breath, renal consulted for emergent HD, and [facility] hospitalist consulted to admit.      Review of systems:     A full 10 point Review of Systems was done, except as stated above, all other Review of Systems were negative.   With Past History of the following :    Past Medical History:  Diagnosis Date   Anemia    Arthritis    Cardiomyopathy [facility])    July 2022   Diabetes mellitus [facility])    Dialysis patient [facility])    GERD (gastroesophageal reflux disease)    HTN (hypertension)    Hyperlipidemia    Nephrotic syndrome    Obesity    Pulmonary  hypertension (HCC)    July 2022 in setting of nephrotic syndrome   Secondary hyperparathyroidism of renal origin [facility])       Past Surgical History:  Procedure Laterality Date   AV FISTULA PLACEMENT Left 12/27/2022   Procedure: INSERTION OF LEFT ARM ARTERIOVENOUS (AV) GORE-TEX GRAFT;  Surgeon: Chuck Hint, MD;  Location: [facility] OR;  Service: Vascular;  Laterality: Left;   AVGG REMOVAL Left 03/15/2023   Procedure: REMOVAL OF LEFT UPPER ARM ARTERIOVENOUS GORETEX GRAFT (AVGG);  Surgeon: [name], MD;  Location: [facility] OR;  Service: Vascular;  Laterality: Left;   BIOPSY  01/13/2023   Procedure: BIOPSY;  Surgeon: Franky Macho, MD;  [facility];  Service: Endoscopy;;   CATARACT EXTRACTION, BILATERAL  2018   COLONOSCOPY WITH PROPOFOL N/A 01/13/2023   Procedure: COLONOSCOPY WITH PROPOFOL;  Surgeon: Franky Macho, MD;  [facility];  Service: Endoscopy;  Laterality: N/A;   INSERTION OF DIALYSIS  CATHETER Right 12/27/2022   Procedure: INSERTION OF Right Internal Jugular  DIALYSIS CATHETER;  Surgeon: Chuck Hint, MD;  Location: [facility] OR;  Service: Vascular;  Laterality: Right;   INSERTION OF DIALYSIS CATHETER N/A 03/15/2023   Procedure: INSERTION OF TUNNELED DIALYSIS CATHETER;  Surgeon: [name], MD;  Location: [facility] OR;  Service: Vascular;  Laterality: N/A;   INSERTION OF DIALYSIS CATHETER Right 03/25/2023   Procedure: INSERTION OF RIGHT TUNNELED DIALYSIS CATHETER;  Surgeon: Daria Pastures, MD;  Location: [facility] OR;  Service: Vascular;  Laterality: Right;   TEE WITHOUT CARDIOVERSION N/A 03/20/2023   Procedure: TRANSESOPHAGEAL ECHOCARDIOGRAM;  Surgeon: Sande Rives, MD;  Location: [facility] OR;  Service: Cardiovascular;  Laterality: N/A;   TONSILLECTOMY        Social History:     Social History   Tobacco Use   Smoking status: Never   Smokeless tobacco: Never  Substance Use Topics   Alcohol use: Never       Family History :     Family  History  Problem Relation Age of Onset   Hypertension Mother    Diabetes Mother    Cancer Father        prostate cancer   Hypertension Maternal Grandfather    Rheum arthritis Maternal Grandmother    CAD Neg Hx       Home Medications:   Prior to Admission medications   Medication Sig Start Date End Date Taking? Authorizing Provider  Accu-Chek Softclix Lancets lancets  08/06/20   [provider]  acetaminophen (TYLENOL) 500 MG tablet Take 500 mg by mouth every 6 (six) hours as needed (arthritis).     [provider]  amLODipine (NORVASC) 10 MG tablet Take 1 tablet (10 mg total) by mouth every morning. 03/29/23   Regalado, Belkys A, MD  atorvastatin (LIPITOR) 20 MG tablet Take 20 mg by mouth every morning. 09/23/20   [provider]  Blood Glucose Monitoring Suppl (ACCU-CHEK GUIDE) w/Device KIT in the morning. 08/06/20   [provider]  hydrALAZINE (APRESOLINE) 10 MG tablet Take 1 tablet (10 mg total) by mouth every 8 (eight) hours. 03/29/23   Regalado, Jon Billings A, MD  levothyroxine (SYNTHROID) 25 MCG tablet Take 1 tablet (25 mcg total) by mouth daily before breakfast. 03/30/23   Regalado, Belkys A, MD  metoprolol tartrate (LOPRESSOR) 50 MG tablet Take 1 tablet (50 mg total) by mouth 2 (two) times daily. 06/16/20   Jacquelin Hawking, PA-C  Multiple Vitamin (MULTIVITAMIN WITH MINERALS) TABS tablet Take 1 tablet by mouth daily. 03/29/23   Regalado, Belkys A, MD  pantoprazole (PROTONIX) 40 MG tablet Take 1 tablet (40 mg total) by mouth daily. 01/14/23 01/14/24  Vassie Loll, MD  polyethylene glycol (MIRALAX / GLYCOLAX) 17 g packet Take 17 g by mouth daily. 03/29/23   Regalado, Belkys A, MD     Allergies:    No Known Allergies   Physical Exam:   Vitals  Blood pressure (!) 195/97, pulse 78, temperature 97.8 F (36.6 C), temperature source Oral, resp. rate 20, height 5' 10.5" (1.791 m), weight 78 kg, SpO2 96%.   1. General, appearing male, laying in bed, no  apparent distress, extremely frail, deconditioned, appears older than stated age  58. Normal affect and insight, Not Suicidal or Homicidal, Awake Alert, Oriented X 3.  3. No F.N deficits, ALL C.Nerves Intact, Strength 5/5 all 4 extremities, Sensation intact all 4 extremities, Plantars down going.  4. Ears and Eyes appear Normal, Conjunctivae clear, PERRLA.  Moist Oral Mucosa.  5. Supple Neck, No JVD, No cervical lymphadenopathy appriciated, No Carotid Bruits.  6. Symmetrical Chest wall movement, Good air movement bilaterally, CTAB, right upper chest tunneled HD catheter  7. RRR, No Gallops, Rubs , No Parasternal Heave.  8. Positive Bowel Sounds, Abdomen Soft, No tenderness, No organomegaly appriciated,No rebound -guarding or rigidity.  9.  No Cyanosis, Normal Skin Turgor, No Skin Rash or Bruise.  10.  joints appear normal , no effusions, Normal ROM.     Data Review:    CBC Recent Labs  Lab 05/19/23 1421  WBC 8.8  HGB 12.4*  HCT 40.7  PLT 197  MCV 96.4  MCH 29.4  MCHC 30.5  RDW 13.6  LYMPHSABS 1.4  MONOABS 0.4  EOSABS 0.6*  BASOSABS 0.1   ------------------------------------------------------------------------------------------------------------------  Chemistries  Recent Labs  Lab 05/19/23 1421  NA 139  K >7.5*  CL 109  CO2 13*  GLUCOSE 93  BUN 96*  CREATININE 7.65*  CALCIUM 8.6*  AST 17  ALT 24  ALKPHOS 161*  BILITOT 0.8   ------------------------------------------------------------------------------------------------------------------ estimated creatinine clearance is 10.2 mL/min (A) (by C-G formula based on SCr of 7.65 mg/dL (H)). ------------------------------------------------------------------------------------------------------------------ No results for input(s): "TSH", "T4TOTAL", "T3FREE", "THYROIDAB" in the last 72 hours.  Invalid input(s): "FREET3"  Coagulation profile Recent Labs  Lab 05/19/23 1421  INR 1.1    ------------------------------------------------------------------------------------------------------------------- No results for input(s): "DDIMER" in the last 72 hours. -------------------------------------------------------------------------------------------------------------------  Cardiac Enzymes No results for input(s): "CKMB", "TROPONINI", "MYOGLOBIN" in the last 168 hours.  Invalid input(s): "CK" ------------------------------------------------------------------------------------------------------------------    Component Value Date/Time   BNP 2,878.0 (H) 08/28/2020 1242     ---------------------------------------------------------------------------------------------------------------  Urinalysis    Component Value Date/Time   COLORURINE AMBER (A) 03/06/2023 1036   APPEARANCEUR CLOUDY (A) 03/06/2023 1036   LABSPEC 1.019 03/06/2023 1036   PHURINE 5.0 03/06/2023 1036   GLUCOSEU NEGATIVE 03/06/2023 1036   HGBUR LARGE (A) 03/06/2023 1036   BILIRUBINUR NEGATIVE 03/06/2023 1036   KETONESUR NEGATIVE 03/06/2023 1036   PROTEINUR >=300 (A) 03/06/2023 1036   NITRITE NEGATIVE 03/06/2023 1036   LEUKOCYTESUR MODERATE (A) 03/06/2023 1036    ----------------------------------------------------------------------------------------------------------------   Imaging Results:    DG Chest Port 1 View  Result Date: 05/19/2023 CLINICAL DATA:  Questionable sepsis - evaluate for abnormality. EXAM: PORTABLE CHEST 1 VIEW COMPARISON:  03/25/2023. FINDINGS: Low lung volume. Redemonstration of left retrocardiac airspace opacity obscuring the left hemidiaphragm and descending thoracic aorta favoring combination of left lower lobe atelectasis and/or consolidation. Bilateral lung fields are otherwise clear. No acute consolidation or lung collapse. No pulmonary edema. Bilateral lateral costophrenic angles are clear. Stable cardio-mediastinal silhouette. No acute osseous abnormalities. The soft  tissues are within normal limits. Redemonstration of right IJ hemodialysis catheter with its tip overlying the cavoatrial junction region. IMPRESSION: *Left basilar opacity, as described above. Electronically Signed   By: Jules Schick M.D.   On: 05/19/2023 15:46    EKG: Vent. rate 84 BPM PR interval 209 ms QRS duration 99 ms QT/QTcB 400/473 ms P-R-T axes 49 -35 74 Sinus rhythm Left ventricular hypertrophy Anterior infarct, old Baseline wander in lead(s) V3  Assessment & Plan:    Principal Problem:   Hyperkalemia Active Problems:   Uncontrolled type 2 diabetes mellitus with hyperglycemia (HCC)   Hyperlipidemia associated with type 2 diabetes mellitus (HCC)   Essential hypertension   Anemia of chronic disease   Dilated cardiomyopathy (HCC)   Anemia of chronic renal failure   Hypothyroidism   ESRD (end  stage renal disease) (HCC)   Endocarditis of mitral valve  Severe hyperkalemia -Secondary to missing HD, last one was 11/29 -He is with severe hyperkalemia, with EKG changes, he received temporizing measures in the ED -Discussed with renal, patient will go for emergent hemodialysis to lower his potassium -Will admit to ICU and monitor on telemetry  Elevated troponins -He denies any chest pain, this is secondary to ESRD and volume overload and missing HD  Diarrhea -Will check for C. difficile given recent history of IV antibiotics treatment  Recent MRSA and Enterococcus bacteremia, Mitral Valve Endocarditis, vegetation on [facility] -Was treated with IV antibiotics until 05/02/2023, is afebrile with no leukocytosis -follow blood culture sent on admission  ESRD on hemodialysis MWF -renal consulted, for emergent HD today. -Recent excision of AV fistula due to infection -Right upper chest HD catheter    Hypertension -Will hold metoprolol and amlodipine for now to allow more blood pressure room for diuresis, and will resume after dialysis, will add as needed hydralazine  Diabetes  type 2 Continue with sliding scale insulin   Hypothyroidism Continue with Synthroid   GERD Continue with PPI     DVT Prophylaxis Heparin AM Labs Ordered, also please review Full Orders  Family Communication: Admission, patients condition and plan of care including tests being ordered have been discussed with the patient and wife at bedside who indicate understanding and agree with the plan and Code Status.  Code Status DNR, confirmed by wife  Likely DC to home  Consults called: Renal  Admission status: Inpatient  Time spent in minutes : 75 minutes   Huey Bienenstock M.D on 05/19/2023 at 5:20 PM   [facility] - Office  [phone]

## 2023-05-19 NOTE — Progress Notes (Signed)
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  On examination he is hypertensive into the 200s saturating 98% on room air.  Status post urgent dialysis with 2 L of fluid removed.  Metabolic panel initially showed anion gap metabolic acidosis with severe hyperkalemia and elevated creatinine.  Troponin is mildly elevated with normocytic anemia.  Chest radiograph with left basilar opacity  eICU Interventions  Agree with broad antibiotics for community-acquired coverage.  Low threshold to initiate vancomycin in the setting of recent Enterococcus and MRSA infections.  C. difficile evaluation.  GI prophylaxis with pantoprazole (home med) DVT prophylaxis with heparin      Intervention Category Evaluation Type: New Patient Evaluation  Lena Gores 05/19/2023, 9:17 PM

## 2023-05-19 NOTE — ED Provider Notes (Signed)
[place] EMERGENCY DEPARTMENT AT [facility] Provider Note   CSN: [id number] Arrival date & time: 05/19/23  1250     History  Chief Complaint  Patient presents with   Weakness    [name] [name] is a 62 y.o. male.  62 year old male with a history of endocarditis status post antibiotics, ESRD on IHD, cardiomyopathy, diabetes, and hypertension who presents to the emergency department with generalized weakness.  Wife states that for the past week her husband has been having significant weakness.  Just completed a course of vancomycin for her mitral valve endocarditis and HD catheter infection on 11/19.  Says that since yesterday has been feeling very weak.  No known fevers.  Did receive his COVID and flu shot yesterday as well.  Patient has not been to dialysis since 05/12/2023 because of his generalized weakness.  Appeared to be growing MRSA and Enterococcus faecalis.  Went to infectious disease yesterday and had blood culture sent which have not yet resulted.       Home Medications Prior to Admission medications   Medication Sig Start Date End Date Taking? Authorizing Provider  Accu-Chek Softclix Lancets lancets  08/06/20   [provider]  acetaminophen (TYLENOL) 500 MG tablet Take 500 mg by mouth every 6 (six) hours as needed (arthritis).     [provider]  amLODipine (NORVASC) 10 MG tablet Take 1 tablet (10 mg total) by mouth every morning. 03/29/23   Regalado, Belkys A, MD  atorvastatin (LIPITOR) 20 MG tablet Take 20 mg by mouth every morning. 09/23/20   [provider]  Blood Glucose Monitoring Suppl (ACCU-CHEK GUIDE) w/Device KIT in the morning. 08/06/20   [provider]  hydrALAZINE (APRESOLINE) 10 MG tablet Take 1 tablet (10 mg total) by mouth every 8 (eight) hours. 03/29/23   Regalado, Jon Billings A, MD  levothyroxine (SYNTHROID) 25 MCG tablet Take 1 tablet (25 mcg total) by mouth daily before breakfast. 03/30/23   Regalado, Belkys A,  MD  metoprolol tartrate (LOPRESSOR) 50 MG tablet Take 1 tablet (50 mg total) by mouth 2 (two) times daily. 06/16/20   Jacquelin Hawking, PA-C  Multiple Vitamin (MULTIVITAMIN WITH MINERALS) TABS tablet Take 1 tablet by mouth daily. 03/29/23   Regalado, Belkys A, MD  pantoprazole (PROTONIX) 40 MG tablet Take 1 tablet (40 mg total) by mouth daily. 01/14/23 01/14/24  Vassie Loll, MD  polyethylene glycol (MIRALAX / GLYCOLAX) 17 g packet Take 17 g by mouth daily. 03/29/23   Regalado, Prentiss Bells, MD      Allergies    Patient has no known allergies.    Review of Systems   Review of Systems  Physical Exam Updated Vital Signs BP (!) 176/98 (BP Location: Right Arm)   Pulse 77   Temp 97.8 F (36.6 C) (Oral)   Resp 20   Ht 5' 10.5" (1.791 m)   Wt 78 kg   SpO2 96%   BMI 24.32 kg/m  Physical Exam Vitals and nursing note reviewed.  Constitutional:      General: He is not in acute distress.    Appearance: He is well-developed.  HENT:     Head: Normocephalic and atraumatic.     Right Ear: External ear normal.     Left Ear: External ear normal.     Nose: Nose normal.  Eyes:     Extraocular Movements: Extraocular movements intact.     Conjunctiva/sclera: Conjunctivae normal.     Pupils: Pupils are equal, round, and reactive to light.  Cardiovascular:     Rate and Rhythm: Normal rate and regular rhythm.     Heart sounds: Normal heart sounds.  Pulmonary:     Effort: Pulmonary effort is normal. No respiratory distress.     Breath sounds: Normal breath sounds.  Abdominal:     General: There is no distension.     Palpations: There is no mass.     Tenderness: There is no abdominal tenderness. There is no guarding.  Musculoskeletal:     Cervical back: Normal range of motion and neck supple.     Right lower leg: No edema.     Left lower leg: No edema.  Skin:    General: Skin is warm and dry.  Neurological:     Mental Status: He is alert. Mental status is at baseline.  Psychiatric:        Mood  and Affect: Mood normal.        Behavior: Behavior normal.     ED Results / Procedures / Treatments   Labs (all labs ordered are listed, but only abnormal results are displayed) Labs Reviewed  COMPREHENSIVE METABOLIC PANEL - Abnormal; Notable for the following components:      Result Value   Potassium >7.5 (*)    CO2 13 (*)    BUN 96 (*)    Creatinine, Ser 7.65 (*)    Calcium 8.6 (*)    Albumin 3.3 (*)    Alkaline Phosphatase 161 (*)    GFR, Estimated 7 (*)    All other components within normal limits  CBC WITH DIFFERENTIAL/PLATELET - Abnormal; Notable for the following components:   Hemoglobin 12.4 (*)    Eosinophils Absolute 0.6 (*)    All other components within normal limits  SEDIMENTATION RATE - Abnormal; Notable for the following components:   Sed Rate 45 (*)    All other components within normal limits  TROPONIN I (HIGH SENSITIVITY) - Abnormal; Notable for the following components:   Troponin I (High Sensitivity) 91 (*)    All other components within normal limits  TROPONIN I (HIGH SENSITIVITY) - Abnormal; Notable for the following components:   Troponin I (High Sensitivity) 110 (*)    All other components within normal limits  CULTURE, BLOOD (ROUTINE X 2)  CULTURE, BLOOD (ROUTINE X 2)  RESP PANEL BY RT-PCR (RSV, FLU A&B, COVID)  RVPGX2  C DIFFICILE QUICK SCREEN W PCR REFLEX    MRSA NEXT GEN BY PCR, NASAL  LACTIC ACID, PLASMA  LACTIC ACID, PLASMA  PROTIME-INR  APTT  URINALYSIS, W/ REFLEX TO CULTURE (INFECTION SUSPECTED)  C-REACTIVE PROTEIN  HEPATITIS B SURFACE ANTIGEN  HEPATITIS B SURFACE ANTIBODY, QUANTITATIVE  BASIC METABOLIC PANEL  CBC  CBG MONITORING, ED    EKG EKG Interpretation Date/Time:  Friday May 19 2023 15:31:02 EST Ventricular Rate:  84 PR Interval:  209 QRS Duration:  99 QT Interval:  400 QTC Calculation: 473 R Axis:   -35  Text Interpretation: Sinus rhythm Left ventricular hypertrophy Anterior infarct, old Baseline wander in  lead(s) V3 Confirmed by Vonita Moss [phone]) on 05/19/2023 3:55:33 PM  Radiology DG Chest Port 1 View  Result Date: 05/19/2023 CLINICAL DATA:  Questionable sepsis - evaluate for abnormality. EXAM: PORTABLE CHEST 1 VIEW COMPARISON:  03/25/2023. FINDINGS: Low lung volume. Redemonstration of left retrocardiac airspace opacity obscuring the left hemidiaphragm and descending thoracic aorta favoring combination of left lower lobe atelectasis and/or consolidation. Bilateral lung fields are otherwise clear. No acute consolidation or lung collapse. No pulmonary edema.  Bilateral lateral costophrenic angles are clear. Stable cardio-mediastinal silhouette. No acute osseous abnormalities. The soft tissues are within normal limits. Redemonstration of right IJ hemodialysis catheter with its tip overlying the cavoatrial junction region. IMPRESSION: *Left basilar opacity, as described above. Electronically Signed   By: Jules Schick M.D.   On: 05/19/2023 15:46    Procedures Procedures    Medications Ordered in ED Medications  calcium gluconate inj 10% (1 g) URGENT USE ONLY! (has no administration in time range)  albuterol (PROVENTIL) (2.5 MG/3ML) 0.083% nebulizer solution 15 mg (15 mg Nebulization Not Given 05/19/23 1638)  Chlorhexidine Gluconate Cloth 2 % PADS 6 each (has no administration in time range)  heparin injection 5,000 Units (has no administration in time range)  acetaminophen (TYLENOL) tablet 650 mg (has no administration in time range)    Or  acetaminophen (TYLENOL) suppository 650 mg (has no administration in time range)  amLODipine (NORVASC) tablet 10 mg (has no administration in time range)  atorvastatin (LIPITOR) tablet 20 mg (has no administration in time range)  hydrALAZINE (APRESOLINE) tablet 10 mg (has no administration in time range)  levothyroxine (SYNTHROID) tablet 25 mcg (has no administration in time range)  metoprolol tartrate (LOPRESSOR) tablet 50 mg (has no administration in  time range)  pantoprazole (PROTONIX) EC tablet 40 mg (has no administration in time range)  sodium zirconium cyclosilicate (LOKELMA) packet 10 g (10 g Oral Given 05/19/23 1635)  LORazepam (ATIVAN) injection 1 mg (1 mg Intravenous Given 05/19/23 1946)    ED Course/ Medical Decision Making/ A&P Clinical Course as of 05/19/23 2000  Fri May 19, 2023  1552 Discussed with Dr Ronalee Belts who will work on getting emergent dialysis for the patient [RP]  1555 Dr Randol Kern from hospitalist to admit the patient [RP]    Clinical Course User Index [RP] Rondel Baton, MD                                 Medical Decision Making Amount and/or Complexity of Data Reviewed Labs: ordered. Radiology: ordered.  Risk Prescription drug management. Decision regarding hospitalization.   Cleburn Greiwe is a 62 y.o. male with comorbidities that complicate the patient evaluation including endocarditis status post antibiotics, ESRD on IHD, cardiomyopathy, diabetes, and hypertension who presents to the emergency department with generalized weakness.   Initial Ddx:  Bacteremia, URI, vaccine side effect, electrolyte abnormality, MI, pneumonia  MDM/Course:  Patient presents to the emergency department with generalized weakness.  Has also missed several dialysis sessions.  Did receive his COVID-vaccine recently.  On exam does not have any neurologic deficits and is hemodynamically stable.  EKG was obtained which did show peaked T waves and a prolonged PR interval.  Was given calcium.  His labs did return which showed a potassium of >7.5.  He was then given insulin, D10, Lokelma, albuterol, and bicarbonate.  Nephrology was consulted for emergent dialysis.  Patient admitted to hospitalist.  Upon re-evaluation patient remained hemodynamically stable.  Chest x-ray did show possible consolidation so patient was given antibiotics for pneumonia.  This patient presents to the ED for concern of complaints listed in HPI, this  involves an extensive number of treatment options, and is a complaint that carries with it a high risk of complications and morbidity. Disposition including potential need for admission considered.   Dispo: Admit  Additional history obtained from spouse Records reviewed Outpatient Clinic Notes The following labs were independently interpreted: Chemistry and  show CKD and hyperkalemia I independently reviewed the following imaging with scope of interpretation limited to determining acute life threatening conditions related to emergency care: Chest x-ray and agree with the radiologist interpretation with the following exceptions: none I personally reviewed and interpreted cardiac monitoring: normal sinus rhythm  I personally reviewed and interpreted the pt's EKG: see above for interpretation  I have reviewed the patients home medications and made adjustments as needed Consults: Hospitalist and Nephrology  Portions of this note were generated with Scientist, clinical (histocompatibility and immunogenetics). Dictation errors may occur despite best attempts at proofreading.    CRITICAL CARE Performed by: Rondel Baton   Total critical care time: 30 minutes  Critical care time was exclusive of separately billable procedures and treating other patients.  Critical care was necessary to treat or prevent imminent or life-threatening deterioration.  Critical care was time spent personally by me on the following activities: development of treatment plan with patient and/or surrogate as well as nursing, discussions with consultants, evaluation of patient's response to treatment, examination of patient, obtaining history from patient or surrogate, ordering and performing treatments and interventions, ordering and review of laboratory studies, ordering and review of radiographic studies, pulse oximetry and re-evaluation of patient's condition.   Final Clinical Impression(s) / ED Diagnoses Final diagnoses:  Weakness  Hyperkalemia   ESRD (end stage renal disease) (HCC)    Rx / DC Orders ED Discharge Orders     None         Rondel Baton, MD 05/19/23 2000

## 2023-05-20 DIAGNOSIS — E875 Hyperkalemia: Secondary | ICD-10-CM | POA: Diagnosis not present

## 2023-05-20 LAB — BASIC METABOLIC PANEL
Anion gap: 13 (ref 5–15)
BUN: 41 mg/dL — ABNORMAL HIGH (ref 8–23)
CO2: 23 mmol/L (ref 22–32)
Calcium: 8.2 mg/dL — ABNORMAL LOW (ref 8.9–10.3)
Chloride: 100 mmol/L (ref 98–111)
Creatinine, Ser: 4.29 mg/dL — ABNORMAL HIGH (ref 0.61–1.24)
GFR, Estimated: 15 mL/min — ABNORMAL LOW (ref >60)
Glucose, Bld: 88 mg/dL (ref 70–99)
Potassium: 4.5 mmol/L (ref 3.5–5.1)
Sodium: 136 mmol/L (ref 135–145)

## 2023-05-20 LAB — CBC
HCT: 34.7 % — ABNORMAL LOW (ref 39.0–52.0)
Hemoglobin: 10.9 g/dL — ABNORMAL LOW (ref 13.0–17.0)
MCH: 29.5 pg (ref 26.0–34.0)
MCHC: 31.4 g/dL (ref 30.0–36.0)
MCV: 93.8 fL (ref 80.0–100.0)
Platelets: 157 10*3/uL (ref 150–400)
RBC: 3.7 MIL/uL — ABNORMAL LOW (ref 4.22–5.81)
RDW: 13.2 % (ref 11.5–15.5)
WBC: 7.5 10*3/uL (ref 4.0–10.5)
nRBC: 0 % (ref 0.0–0.2)

## 2023-05-20 LAB — HEPATITIS B SURFACE ANTIBODY, QUANTITATIVE: Hep B S AB Quant (Post): 1905 m[IU]/mL

## 2023-05-20 MED ORDER — RISAQUAD PO CAPS
2.0000 | ORAL_CAPSULE | Freq: Three times a day (TID) | ORAL | Status: DC
  Administered 2023-05-20 (×2): 2 via ORAL
  Filled 2023-05-20 (×2): qty 2

## 2023-05-20 MED ORDER — CHLORHEXIDINE GLUCONATE CLOTH 2 % EX PADS
6.0000 | MEDICATED_PAD | Freq: Every day | CUTANEOUS | Status: DC
  Administered 2023-05-20: 6 via TOPICAL

## 2023-05-20 MED ORDER — ONDANSETRON 4 MG PO TBDP: 4.0000 mg | ORAL_TABLET | Freq: Three times a day (TID) | ORAL | 2 refills | Status: DC | PRN

## 2023-05-20 MED ORDER — HYDRALAZINE HCL 20 MG/ML IJ SOLN
10.0000 mg | Freq: Four times a day (QID) | INTRAMUSCULAR | Status: DC | PRN
  Administered 2023-05-20: 10 mg via INTRAVENOUS
  Filled 2023-05-20: qty 1

## 2023-05-20 MED ORDER — LABETALOL HCL 5 MG/ML IV SOLN
20.0000 mg | INTRAVENOUS | Status: DC | PRN
  Administered 2023-05-20: 20 mg via INTRAVENOUS
  Filled 2023-05-20 (×2): qty 4

## 2023-05-20 MED ORDER — ONDANSETRON HCL 4 MG PO TABS
4.0000 mg | ORAL_TABLET | Freq: Once | ORAL | Status: AC
  Administered 2023-05-20: 4 mg via ORAL
  Filled 2023-05-20: qty 1

## 2023-05-20 MED ORDER — MUPIROCIN 2 % EX OINT
1.0000 | TOPICAL_OINTMENT | Freq: Two times a day (BID) | CUTANEOUS | Status: DC
  Administered 2023-05-20 (×2): 1 via NASAL
  Filled 2023-05-20: qty 22

## 2023-05-20 MED ORDER — ONDANSETRON 4 MG PO TBDP
ORAL_TABLET | ORAL | Status: AC
  Filled 2023-05-20: qty 1

## 2023-05-20 MED ORDER — RISAQUAD PO CAPS: 2.0000 | ORAL_CAPSULE | Freq: Three times a day (TID) | ORAL | 0 refills | Status: AC

## 2023-05-20 NOTE — Progress Notes (Addendum)
Physician Discharge Summary [facility]    Patient: [name] [name]                   Admit date: 05/19/2023   DOB: [birth year]             Discharge date:05/20/2023/1:22 PM ZOX:[id number]                          PCP: Farris Has, MD  Disposition:   HOME    Recommendations for Outpatient Follow-up:   Follow up: Nephrology and PCP in 1-2 weeks Continue scheduled hemodialysis Discharge Condition: Stable   Code Status:   Code Status: Limited: Do not attempt resuscitation (DNR) -DNR-LIMITED -Do Not Intubate/DNI   Diet recommendation: Renal diet   Discharge Diagnoses:    Principal Problem:   Hyperkalemia Active Problems:   Uncontrolled type 2 diabetes mellitus with hyperglycemia (HCC)   Hyperlipidemia associated with type 2 diabetes mellitus (HCC)   Essential hypertension   Anemia of chronic disease   Dilated cardiomyopathy (HCC)   Anemia of chronic renal failure   Hypothyroidism   ESRD (end stage renal disease) (HCC)   Endocarditis of mitral valve   History of Present Illness/ Hospital Course [name] Summary:    [name] [name]  is a 62 y.o. male, with past medical history significant for ESRD on hemodialysis MWF, hypertension, diabetes type 2, pulmonary hypertension, cardiomyopathy, GERD, dyslipidemia, anemia of CKD, with recent hospitalization secondary to COVID-pneumonia, and MRSA and enterococcal bacteremia.  Mitral valve endocarditis, he finished his IV vancomycin course on 05/02/2023.  -Patient was brought with his wife to ED secondary to weakness, wife report patient has been having diarrhea for the last week, poor appetite, poor oral intake, generalized weakness and deconditioning, last time he had his dialysis 05/12/2023, have any further dialysis since, has any chest pain, fever, chills, shortness of breath. ED; Noted for elevated potassium> 7.5, chest x-ray significant for volume overload versus opacity, EKG with peaked T waves, troponins elevated at 110, he  denied any chest pain or shortness of breath, renal consulted for emergent HD, and [facility] consulted to admit.    Severe hyperkalemia Resolved with hemodialysis  -Secondary to missing HD, last one was 11/29 -He is with severe hyperkalemia, with EKG changes, he received temporizing measures in the ED -Responded well with hemodialysis -Potassium > 7.5 on admission this morning 4.5   Elevated troponins -Denies any chest pain -He denies any chest pain, this is secondary to ESRD and volume overload and missing HD   Diarrhea -Will check for C. difficile given recent history of IV antibiotics treatment   Recent MRSA and Enterococcus bacteremia, Mitral Valve Endocarditis, vegetation on [facility] -Was treated with IV antibiotics until 05/02/2023, is afebrile with no leukocytosis -follow blood culture sent on admission-follows outpatient   ESRD on hemodialysis MWF -renal consulted, for emergent HD 05/19/2023 -Recent excision of AV fistula due to infection -Right upper chest HD catheter    Hypertension -Resume home medication of metoprolol and amlodipine    Diabetes type 2 ???  -Last A1c: 5.1 (4 months ago) -Diabetic diet,  Not on any diabetic medication, blood sugars running low 82 this morning CBG 93, 88, 82   Hypothyroidism Continue with Synthroid   GERD Continue with PPI       The patient's BMI is: Body mass index is 23.7 kg/m. I agree with the assessment and plan as outlined below: Nutrition Status:  SKIN assessment: I agree with skin assessment and plan as outlined below:    Risk of unplanned readmission Score:     Discharge Instructions:   Discharge Instructions     Activity as tolerated - No restrictions   Complete by: As directed    Call MD for:  difficulty breathing, headache or visual disturbances   Complete by: As directed    Call MD for:  persistant dizziness or light-headedness   Complete by: As directed    Call MD for:   persistant nausea and vomiting   Complete by: As directed    Call MD for:  temperature >100.4   Complete by: As directed    Diet - low sodium heart healthy   Complete by: As directed    Discharge instructions   Complete by: As directed    Continue hemodialysis as scheduled Follow with the PCP, nephrologist within 1-2 weeks   Increase activity slowly   Complete by: As directed       Allergies as of 05/20/2023   No Known Allergies      Medication List     STOP taking these medications    pantoprazole 40 MG tablet Commonly known as: Protonix   polyethylene glycol 17 g packet Commonly known as: MIRALAX / GLYCOLAX       TAKE these medications    Accu-Chek Softclix Lancets lancets   acetaminophen 500 MG tablet Commonly known as: TYLENOL Take 500 mg by mouth every 6 (six) hours as needed (arthritis).   acidophilus Caps capsule Take 2 capsules by mouth 3 (three) times daily with meals for 10 days.   amLODipine 10 MG tablet Commonly known as: NORVASC Take 1 tablet (10 mg total) by mouth every morning.   atorvastatin 20 MG tablet Commonly known as: LIPITOR Take 20 mg by mouth every morning.   hydrALAZINE 10 MG tablet Commonly known as: APRESOLINE Take 1 tablet (10 mg total) by mouth every 8 (eight) hours.   levothyroxine 25 MCG tablet Commonly known as: SYNTHROID Take 1 tablet (25 mcg total) by mouth daily before breakfast.   metoprolol tartrate 50 MG tablet Commonly known as: LOPRESSOR Take 1 tablet (50 mg total) by mouth 2 (two) times daily.   multivitamin with minerals Tabs tablet Take 1 tablet by mouth daily.   ondansetron 4 MG disintegrating tablet Commonly known as: ZOFRAN-ODT Take 1 tablet (4 mg total) by mouth every 8 (eight) hours as needed for nausea or vomiting.         No Known Allergies    Vaccines:  Also highly recommended update your vaccines requirement, including SARS-CoV-2 , yearly  flu vaccine and pneumonia vaccine at least  every 5 years.      Exercise: If you are able: 30 -60 minutes a day, 4 days a week, or 150 minutes a week.  The longer the better.  Combine stretch, strength, and aerobic activities.  If you were told in the past that you have high risk for cardiovascular diseases, you may seek evaluation by your heart doctor prior to initiating moderate to intense exercise programs.    One other important lifestyle recommendation is to ensure adequate sleep - at least 6-7 hours of uninterrupted sleep at night.  Procedures /Studies:   DG Chest Port 1 View  Result Date: 05/19/2023 CLINICAL DATA:  Questionable sepsis - evaluate for abnormality. EXAM: PORTABLE CHEST 1 VIEW COMPARISON:  03/25/2023. FINDINGS: Low lung volume. Redemonstration of left retrocardiac airspace opacity obscuring the left hemidiaphragm and descending  thoracic aorta favoring combination of left lower lobe atelectasis and/or consolidation. Bilateral lung fields are otherwise clear. No acute consolidation or lung collapse. No pulmonary edema. Bilateral lateral costophrenic angles are clear. Stable cardio-mediastinal silhouette. No acute osseous abnormalities. The soft tissues are within normal limits. Redemonstration of right IJ hemodialysis catheter with its tip overlying the cavoatrial junction region. IMPRESSION: *Left basilar opacity, as described above. Electronically Signed   By: Jules Schick M.D.   On: 05/19/2023 15:46    Subjective:   Patient was seen and examined 05/20/2023, 1:22 PM Patient stable today. No acute distress.  No issues overnight Stable for discharge.  Discharge Exam:    Vitals:   05/20/23 1000 05/20/23 1129 05/20/23 1204 05/20/23 1316  BP: (!) 176/77  (!) 181/85 (!) 195/103  Pulse: 75   72  Resp: (!) 22     Temp:  98.5 F (36.9 C)    TempSrc:  Oral    SpO2: 96%   97%  Weight:      Height:        General: Pt lying comfortably in bed & appears in no obvious distress. Cardiovascular: S1 & S2 heard, RRR,  S1/S2 +. No murmurs, rubs, gallops or clicks. No JVD or pedal edema. Respiratory: Clear to auscultation without wheezing, rhonchi or crackles. No increased work of breathing. Abdominal:  Non-distended, non-tender & soft. No organomegaly or masses appreciated. Normal bowel sounds heard. CNS: Alert and oriented. No focal deficits. Extremities: no edema, no cyanosis      The results of significant diagnostics from this hospitalization (including imaging, microbiology, ancillary and laboratory) are listed below for reference.      Microbiology:   Recent Results (from the past 240 hour(s))  Culture, blood (single)     Status: None (Preliminary result)   Collection Time: 05/18/23 11:38 AM   Specimen: Blood  Result Value Ref Range Status   MICRO NUMBER: [id number]  Preliminary   SPECIMEN QUALITY: Suboptimal  Preliminary   Source BLOOD 1  Preliminary   STATUS: PRELIMINARY  Preliminary   Result:   Preliminary    No growth to date. Culture is continuously monitored for a total of 120 hours incubation. A change in status will result in a phone report followed by an updated printed culture report. Inspection of blood culture bottles indicates that an inadequate  volume of blood may have been collected for the detection of sepsis.    COMMENT: Aerobic and anaerobic bottle received.  Preliminary  Culture, blood (single)     Status: None (Preliminary result)   Collection Time: 05/18/23 11:38 AM   Specimen: Blood  Result Value Ref Range Status   MICRO NUMBER: [id number]  Preliminary   SPECIMEN QUALITY: Adequate  Preliminary   Source BLOOD 2  Preliminary   STATUS: PRELIMINARY  Preliminary   Result:   Preliminary    No growth to date. Culture is continuously monitored for a total of 120 hours incubation. A change in status will result in a phone report followed by an updated printed culture report.   COMMENT: Aerobic and anaerobic bottle received.  Preliminary  Resp panel by RT-PCR (RSV, Flu A&B,  Covid) Nasal Mucosa     Status: None   Collection Time: 05/19/23  2:01 PM   Specimen: Nasal Mucosa; Nasal Swab  Result Value Ref Range Status   SARS Coronavirus 2 by RT PCR NEGATIVE NEGATIVE Final    Comment: (NOTE) SARS-CoV-2 target nucleic acids are NOT DETECTED.  The SARS-CoV-2 RNA is generally  detectable in upper respiratory specimens during the acute phase of infection. The lowest concentration of SARS-CoV-2 viral copies this assay can detect is 138 copies/mL. A negative result does not preclude SARS-Cov-2 infection and should not be used as the sole basis for treatment or other patient management decisions. A negative result may occur with  improper specimen collection/handling, submission of specimen other than nasopharyngeal swab, presence of viral mutation(s) within the areas targeted by this assay, and inadequate number of viral copies(<138 copies/mL). A negative result must be combined with clinical observations, patient history, and epidemiological information. The expected result is Negative.  Fact Sheet for Patients:  BloggerCourse.com  Fact Sheet for Healthcare Providers:  SeriousBroker.it  This test is no t yet approved or cleared by the [place] FDA and  has been authorized for detection and/or diagnosis of SARS-CoV-2 by FDA under an Emergency Use Authorization (EUA). This EUA will remain  in effect (meaning this test can be used) for the duration of the COVID-19 declaration under Section 564(b)(1) of the Act, 21 U.S.C.section 360bbb-3(b)(1), unless the authorization is terminated  or revoked sooner.       Influenza A by PCR NEGATIVE NEGATIVE Final   Influenza B by PCR NEGATIVE NEGATIVE Final    Comment: (NOTE) The Xpert Xpress SARS-CoV-2/FLU/RSV plus assay is intended as an aid in the diagnosis of influenza from Nasopharyngeal swab specimens and should not be used as a sole basis for treatment. Nasal  washings and aspirates are unacceptable for Xpert Xpress SARS-CoV-2/FLU/RSV testing.  Fact Sheet for Patients: BloggerCourse.com  Fact Sheet for Healthcare Providers: SeriousBroker.it  This test is not yet approved or cleared by the [place] FDA and has been authorized for detection and/or diagnosis of SARS-CoV-2 by FDA under an Emergency Use Authorization (EUA). This EUA will remain in effect (meaning this test can be used) for the duration of the COVID-19 declaration under Section 564(b)(1) of the Act, 21 U.S.C. section 360bbb-3(b)(1), unless the authorization is terminated or revoked.     Resp Syncytial Virus by PCR NEGATIVE NEGATIVE Final    Comment: (NOTE) Fact Sheet for Patients: BloggerCourse.com  Fact Sheet for Healthcare Providers: SeriousBroker.it  This test is not yet approved or cleared by the [place] FDA and has been authorized for detection and/or diagnosis of SARS-CoV-2 by FDA under an Emergency Use Authorization (EUA). This EUA will remain in effect (meaning this test can be used) for the duration of the COVID-19 declaration under Section 564(b)(1) of the Act, 21 U.S.C. section 360bbb-3(b)(1), unless the authorization is terminated or revoked.  Performed at [facility], [address] [place], [place] [address]   Blood Culture (routine x 2)     Status: None (Preliminary result)   Collection Time: 05/19/23  2:21 PM   Specimen: BLOOD  Result Value Ref Range Status   Specimen Description BLOOD RIGHT ANTECUBITAL  Final   Special Requests   Final    Blood Culture results may not be optimal due to an inadequate volume of blood received in culture bottles BOTTLES DRAWN AEROBIC ONLY   Culture   Final    NO GROWTH < 24 HOURS Performed at [facility], [address] [place], [place] [address]    Report Status PENDING  Incomplete  Blood Culture (routine  x 2)     Status: None (Preliminary result)   Collection Time: 05/19/23  3:42 PM   Specimen: BLOOD  Result Value Ref Range Status   Specimen Description BLOOD BLOOD RIGHT HAND  Final  Special Requests   Final    Blood Culture adequate volume BOTTLES DRAWN AEROBIC AND ANAEROBIC   Culture   Final    NO GROWTH < 24 HOURS Performed at [facility], [address], [place], [place] [address]    Report Status PENDING  Incomplete  MRSA Next Gen by PCR, Nasal     Status: Abnormal   Collection Time: 05/19/23  5:55 PM   Specimen: Nasal Mucosa; Nasal Swab  Result Value Ref Range Status   MRSA by PCR Next Gen DETECTED (A) NOT DETECTED Final    Comment: RESULT CALLED TO, READ BACK BY AND VERIFIED WITH: REDMAN,A @ 2302 ON 05/19/23 BY JUW (NOTE) The GeneXpert MRSA Assay (FDA approved for NASAL specimens only), is one component of a comprehensive MRSA colonization surveillance program. It is not intended to diagnose MRSA infection nor to guide or monitor treatment for MRSA infections. Test performance is not FDA approved in patients less than 23 years old. Performed at [facility], [address], [place], [place] [address]      Labs:   CBC: Recent Labs  Lab 05/19/23 1421 05/20/23 0541  WBC 8.8 7.5  NEUTROABS 6.3  --   HGB 12.4* 10.9*  HCT 40.7 34.7*  MCV 96.4 93.8  PLT 197 157   Basic Metabolic Panel: Recent Labs  Lab 05/19/23 1421 05/20/23 0541  NA 139 136  K >7.5* 4.5  CL 109 100  CO2 13* 23  GLUCOSE 93 88  BUN 96* 41*  CREATININE 7.65* 4.29*  CALCIUM 8.6* 8.2*   Liver Function Tests: Recent Labs  Lab 05/19/23 1421  AST 17  ALT 24  ALKPHOS 161*  BILITOT 0.8  PROT 7.0  ALBUMIN 3.3*   BNP (last 3 results) No results for input(s): "BNP" in the last 8760 hours. Cardiac Enzymes: No results for input(s): "CKTOTAL", "CKMB", "CKMBINDEX", "TROPONINI" in the last 168 hours. CBG: Recent Labs  Lab 05/19/23 2138  GLUCAP 82   Hgb A1c No results for input(s): "HGBA1C"  in the last 72 hours. Lipid Profile No results for input(s): "CHOL", "HDL", "LDLCALC", "TRIG", "CHOLHDL", "LDLDIRECT" in the last 72 hours. Thyroid function studies No results for input(s): "TSH", "T4TOTAL", "T3FREE", "THYROIDAB" in the last 72 hours.  Invalid input(s): "FREET3" Anemia work up No results for input(s): "VITAMINB12", "FOLATE", "FERRITIN", "TIBC", "IRON", "RETICCTPCT" in the last 72 hours. Urinalysis    Component Value Date/Time   COLORURINE AMBER (A) 03/06/2023 1036   APPEARANCEUR CLOUDY (A) 03/06/2023 1036   LABSPEC 1.019 03/06/2023 1036   PHURINE 5.0 03/06/2023 1036   GLUCOSEU NEGATIVE 03/06/2023 1036   HGBUR LARGE (A) 03/06/2023 1036   BILIRUBINUR NEGATIVE 03/06/2023 1036   KETONESUR NEGATIVE 03/06/2023 1036   PROTEINUR >=300 (A) 03/06/2023 1036   NITRITE NEGATIVE 03/06/2023 1036   LEUKOCYTESUR MODERATE (A) 03/06/2023 1036         Time coordinating discharge: 45 minutes  SIGNED: Kendell Bane, MD, FACP, FHM. [facility],  Please use [email or web address] to Page If 7PM-7AM, please contact night-coverage [email or web address],  05/20/2023, 1:22 PM

## 2023-05-20 NOTE — Evaluation (Signed)
Physical Therapy Evaluation Patient Details Name: [name] [name] MRN: [id number] DOB: 12/17/1960 Today's Date: 05/20/2023  History of Present Illness  [name] [name]  is a 62 y.o. male, with past medical history significant for ESRD on hemodialysis MWF, hypertension, diabetes type 2, pulmonary hypertension, cardiomyopathy, GERD, dyslipidemia, anemia of CKD, with recent hospitalization secondary to COVID-pneumonia, and MRSA and enterococcal bacteremia.  Mitral valve endocarditis, he finished his IV vancomycin course on 05/02/2023.   -Patient was brought with his wife to ED secondary to weakness, wife report patient has been having diarrhea for the last week, poor appetite, poor oral intake, generalized weakness and deconditioning, last time he had his dialysis 05/12/2023, have any further dialysis since, has any chest pain, fever, chills, shortness of breath.  -In ED his labs were significant for elevated potassium> 7.5, chest x-ray significant for volume overload versus opacity, EKG with peaked T waves, troponins elevated at 110, he denies any chest pain or shortness of breath, renal consulted for emergent HD, and [facility] consulted to admit.   Clinical Impression  Patient functioning near baseline for functional mobility and gait requiring Mod assist for transferring to chair and declined to attempt gait training due to c/o fatigue.  Patient tolerated sitting up in chair after therapy.  PLAN:  Patient to be discharged home today and discharged from acute physical therapy to care of nursing for out of bed as tolerated for length of stay with recommendations stated below           If plan is discharge home, recommend the following: A lot of help with walking and/or transfers;A little help with bathing/dressing/bathroom;Help with stairs or ramp for entrance;Assistance with cooking/housework   Can travel by private vehicle        Equipment Recommendations None recommended by PT   Recommendations for Other Services       Functional Status Assessment Patient has had a recent decline in their functional status and/or demonstrates limited ability to make significant improvements in function in a reasonable and predictable amount of time     Precautions / Restrictions Precautions Precautions: Fall Restrictions Weight Bearing Restrictions: No      Mobility  Bed Mobility Overal bed mobility: Needs Assistance Bed Mobility: Supine to Sit     Supine to sit: Supervision, Contact guard     General bed mobility comments: increased time, labored movement with occasionl posterior leaninng    Transfers Overall transfer level: Needs assistance   Transfers: Sit to/from Stand, Bed to chair/wheelchair/BSC Sit to Stand: Min assist   Step pivot transfers: Min assist       General transfer comment: unsteady labored movement having to lean on armrest of chair for completing transfer    Ambulation/Gait Ambulation/Gait assistance: Mod assist Gait Distance (Feet): 2 Feet Assistive device: 1 person hand held assist Gait Pattern/deviations: Decreased step length - right, Decreased step length - left, Decreased stride length Gait velocity: slow     General Gait Details: limited to a couple of steps during step pivot transfer leaning on armrest of chair  Stairs            Wheelchair Mobility     Tilt Bed    Modified Rankin (Stroke Patients Only)       Balance Overall balance assessment: Needs assistance Sitting-balance support: Feet supported, No upper extremity supported Sitting balance-Leahy Scale: Fair Sitting balance - Comments: seated at EOB Postural control: Posterior lean Standing balance support: During functional activity, Bilateral upper extremity supported Standing balance-Leahy Scale: Poor  Standing balance comment: leaning on armrest of chair                             Pertinent Vitals/Pain Pain Assessment Pain  Assessment: Faces Faces Pain Scale: Hurts a little bit Pain Location: stomach Pain Descriptors / Indicators: Aching Pain Intervention(s): Limited activity within patient's tolerance, Monitored during session, Repositioned    Home Living Family/patient expects to be discharged to:: Private residence Living Arrangements: Parent Available Help at Discharge: Family;Available 24 hours/day Type of Home: House Home Access: Stairs to enter   [employer] of Steps: 1 step in back   Home Layout: One level Home Equipment: Shower seat;Cane - single Librarian, academic (2 wheels);Wheelchair - manual;Wheelchair - power;Grab bars - toilet      Prior Function Prior Level of Function : Needs assist       Physical Assist : Mobility (physical);ADLs (physical) Mobility (physical): Bed mobility;Transfers;Stairs;Gait   Mobility Comments: very short household distances using RW, uses w/c mostly, assisted for transfers ADLs Comments: Assisted by family     Extremity/Trunk Assessment   Upper Extremity Assessment Upper Extremity Assessment: Generalized weakness    Lower Extremity Assessment Lower Extremity Assessment: Generalized weakness    Cervical / Trunk Assessment Cervical / Trunk Assessment: Normal  Communication   Communication Communication: No apparent difficulties Cueing Techniques: Verbal cues;Tactile cues  Cognition Arousal: Alert Behavior During Therapy: WFL for tasks assessed/performed Overall Cognitive Status: Within Functional Limits for tasks assessed                                          General Comments      Exercises     Assessment/Plan    PT Assessment All further PT needs can be met in the next venue of care  PT Problem List Decreased strength;Decreased activity tolerance;Decreased balance;Decreased mobility       PT Treatment Interventions      PT Goals (Current goals can be found in the Care Plan section)  Acute Rehab PT  Goals Patient Stated Goal: return home with family to assist PT Goal Formulation: With patient Time For Goal Achievement: 05/20/23 Potential to Achieve Goals: Good    Frequency       Co-evaluation               AM-PAC PT "6 Clicks" Mobility  Outcome Measure Help needed turning from your back to your side while in a flat bed without using bedrails?: A Little Help needed moving from lying on your back to sitting on the side of a flat bed without using bedrails?: A Little Help needed moving to and from a bed to a chair (including a wheelchair)?: A Lot Help needed standing up from a chair using your arms (e.g., wheelchair or bedside chair)?: A Lot Help needed to walk in hospital room?: A Lot Help needed climbing 3-5 steps with a railing? : Total 6 Click Score: 13    End of Session   Activity Tolerance: Patient tolerated treatment well;Patient limited by fatigue Patient left: in chair;with call bell/phone within reach Nurse Communication: Mobility status PT Visit Diagnosis: Unsteadiness on feet (R26.81);Other abnormalities of gait and mobility (R26.89);Muscle weakness (generalized) (M62.81)    Time: 1610-9604 PT Time Calculation (min) (ACUTE ONLY): 25 min   Charges:   PT Evaluation $PT Eval Moderate Complexity: 1 Mod PT Treatments $  Therapeutic Activity: 23-37 mins PT General Charges $$ ACUTE PT VISIT: 1 Visit         1:14 PM, 05/20/23 Ocie Bob, MPT Physical Therapist with [facility] 336 [phone] office [phone] mobile phone

## 2023-05-20 NOTE — Care Management Obs Status (Signed)
MEDICARE OBSERVATION STATUS NOTIFICATION   Patient Details  Name: [name] [name] MRN: [id number] Date of Birth: 1961-03-01   Medicare Observation Status Notification Given:  Yes    Ariyah Sedlack Marsh Dolly, LCSW 05/20/2023, 11:46 AM

## 2023-05-20 NOTE — Discharge Summary (Signed)
Physician Discharge Summary   Patient: [name] [name] MRN: [id number] DOB: [birth year]  Admit date:     05/19/2023  Discharge date: 05/20/23  Discharge Physician: Kendell Bane   PCP: Farris Has, MD   Recommendations at discharge:   Follow-up with PCP in 1 week Follow-up with nephrologist and continue scheduled dialysis   Discharge Diagnoses: Principal Problem:   Hyperkalemia Active Problems:   Uncontrolled type 2 diabetes mellitus with hyperglycemia (HCC)   Hyperlipidemia associated with type 2 diabetes mellitus (HCC)   Essential hypertension   Anemia of chronic disease   Dilated cardiomyopathy (HCC)   Anemia of chronic renal failure   Hypothyroidism   ESRD (end stage renal disease) (HCC)   Endocarditis of mitral valve  Resolved Problems:   * No resolved hospital problems. *  Hospital Course: [name] [name]  is a 62 y.o. male, with past medical history significant for ESRD on hemodialysis MWF, hypertension, diabetes type 2, pulmonary hypertension, cardiomyopathy, GERD, dyslipidemia, anemia of CKD, with recent hospitalization secondary to COVID-pneumonia, and MRSA and enterococcal bacteremia.  Mitral valve endocarditis, he finished his IV vancomycin course on 05/02/2023.  -Patient was brought with his wife to ED secondary to weakness, wife report patient has been having diarrhea for the last week, poor appetite, poor oral intake, generalized weakness and deconditioning, last time he had his dialysis 05/12/2023, have any further dialysis since, has any chest pain, fever, chills, shortness of breath. ED; Noted for elevated potassium> 7.5, chest x-ray significant for volume overload versus opacity, EKG with peaked T waves, troponins elevated at 110, he denied any chest pain or shortness of breath, renal consulted for emergent HD, and [facility] hospitalist consulted to admit.  Severe hyperkalemia Resolved with hemodialysis   -Secondary to missing HD, last one was 11/29 -He is with  severe hyperkalemia, with EKG changes, he received temporizing measures in the ED -Responded well with hemodialysis -Potassium > 7.5 on admission this morning 4.5   Elevated troponins -Denies any chest pain -He denies any chest pain, this is secondary to ESRD and volume overload and missing HD   Diarrhea -Will check for C. difficile given recent history of IV antibiotics treatment   Recent MRSA and Enterococcus bacteremia, Mitral Valve Endocarditis, vegetation on [facility] -Was treated with IV antibiotics until 05/02/2023, is afebrile with no leukocytosis -follow blood culture sent on admission-follows outpatient   ESRD on hemodialysis MWF -renal consulted, for emergent HD 05/19/2023 -Recent excision of AV fistula due to infection -Right upper chest HD catheter    Hypertension -Resume home medication of metoprolol and amlodipine     Diabetes type 2 ???  -Last A1c: 5.1 (4 months ago) -Diabetic diet,  Not on any diabetic medication, blood sugars running low 82 this morning CBG 93, 88, 82   Hypothyroidism Continue with Synthroid   GERD Continue with PPI Consultants: Nephrologist Procedures performed: Hemodialysis Disposition: Home Diet recommendation:  Discharge Diet Orders (From admission, onward)     Start     Ordered   05/20/23 0000  Diet - low sodium heart healthy        05/20/23 1043           Regular diet DISCHARGE MEDICATION: Allergies as of 05/20/2023   No Known Allergies      Medication List     STOP taking these medications    pantoprazole 40 MG tablet Commonly known as: Protonix   polyethylene glycol 17 g packet Commonly known as: MIRALAX / GLYCOLAX  TAKE these medications    Accu-Chek Softclix Lancets lancets   acetaminophen 500 MG tablet Commonly known as: TYLENOL Take 500 mg by mouth every 6 (six) hours as needed (arthritis).   acidophilus Caps capsule Take 2 capsules by mouth 3 (three) times daily with meals for 10 days.    amLODipine 10 MG tablet Commonly known as: NORVASC Take 1 tablet (10 mg total) by mouth every morning.   atorvastatin 20 MG tablet Commonly known as: LIPITOR Take 20 mg by mouth every morning.   hydrALAZINE 10 MG tablet Commonly known as: APRESOLINE Take 1 tablet (10 mg total) by mouth every 8 (eight) hours.   levothyroxine 25 MCG tablet Commonly known as: SYNTHROID Take 1 tablet (25 mcg total) by mouth daily before breakfast.   metoprolol tartrate 50 MG tablet Commonly known as: LOPRESSOR Take 1 tablet (50 mg total) by mouth 2 (two) times daily.   multivitamin with minerals Tabs tablet Take 1 tablet by mouth daily.   ondansetron 4 MG disintegrating tablet Commonly known as: ZOFRAN-ODT Take 1 tablet (4 mg total) by mouth every 8 (eight) hours as needed for nausea or vomiting.        Discharge Exam: Filed Weights   05/19/23 1326 05/19/23 1713 05/19/23 2046  Weight: 78 kg 78 kg 76 kg        General:  AAO x 3,  cooperative, no distress;   HEENT:  Normocephalic, PERRL, otherwise with in Normal limits   Neuro:  CNII-XII intact. , normal motor and sensation, reflexes intact   Lungs:   Clear to auscultation BL, Respirations unlabored,  No wheezes / crackles  Cardio:    S1/S2, RRR, No murmure, No Rubs or Gallops   Abdomen:  Soft, non-tender, bowel sounds active all four quadrants, no guarding or peritoneal signs.  Muscular  skeletal:  Limited exam -global generalized weaknesses - in bed, able to move all 4 extremities,   2+ pulses,  symmetric, No pitting edema  Skin:  Dry, warm to touch, negative for any Rashes,  Wounds: Please see nursing documentation          Condition at discharge: good  The results of significant diagnostics from this hospitalization (including imaging, microbiology, ancillary and laboratory) are listed below for reference.   Imaging Studies: DG Chest Port 1 View  Result Date: 05/19/2023 CLINICAL DATA:  Questionable sepsis - evaluate  for abnormality. EXAM: PORTABLE CHEST 1 VIEW COMPARISON:  03/25/2023. FINDINGS: Low lung volume. Redemonstration of left retrocardiac airspace opacity obscuring the left hemidiaphragm and descending thoracic aorta favoring combination of left lower lobe atelectasis and/or consolidation. Bilateral lung fields are otherwise clear. No acute consolidation or lung collapse. No pulmonary edema. Bilateral lateral costophrenic angles are clear. Stable cardio-mediastinal silhouette. No acute osseous abnormalities. The soft tissues are within normal limits. Redemonstration of right IJ hemodialysis catheter with its tip overlying the cavoatrial junction region. IMPRESSION: *Left basilar opacity, as described above. Electronically Signed   By: Jules Schick M.D.   On: 05/19/2023 15:46    Microbiology: Results for orders placed or performed during the hospital encounter of 05/19/23  Resp panel by RT-PCR (RSV, Flu A&B, Covid) Nasal Mucosa     Status: None   Collection Time: 05/19/23  2:01 PM   Specimen: Nasal Mucosa; Nasal Swab  Result Value Ref Range Status   SARS Coronavirus 2 by RT PCR NEGATIVE NEGATIVE Final    Comment: (NOTE) SARS-CoV-2 target nucleic acids are NOT DETECTED.  The SARS-CoV-2 RNA is generally detectable  in upper respiratory specimens during the acute phase of infection. The lowest concentration of SARS-CoV-2 viral copies this assay can detect is 138 copies/mL. A negative result does not preclude SARS-Cov-2 infection and should not be used as the sole basis for treatment or other patient management decisions. A negative result may occur with  improper specimen collection/handling, submission of specimen other than nasopharyngeal swab, presence of viral mutation(s) within the areas targeted by this assay, and inadequate number of viral copies(<138 copies/mL). A negative result must be combined with clinical observations, patient history, and epidemiological information. The expected  result is Negative.  Fact Sheet for Patients:  BloggerCourse.com  Fact Sheet for Healthcare Providers:  SeriousBroker.it  This test is no t yet approved or cleared by the [place] FDA and  has been authorized for detection and/or diagnosis of SARS-CoV-2 by FDA under an Emergency Use Authorization (EUA). This EUA will remain  in effect (meaning this test can be used) for the duration of the COVID-19 declaration under Section 564(b)(1) of the Act, 21 U.S.C.section 360bbb-3(b)(1), unless the authorization is terminated  or revoked sooner.       Influenza A by PCR NEGATIVE NEGATIVE Final   Influenza B by PCR NEGATIVE NEGATIVE Final    Comment: (NOTE) The Xpert Xpress SARS-CoV-2/FLU/RSV plus assay is intended as an aid in the diagnosis of influenza from Nasopharyngeal swab specimens and should not be used as a sole basis for treatment. Nasal washings and aspirates are unacceptable for Xpert Xpress SARS-CoV-2/FLU/RSV testing.  Fact Sheet for Patients: BloggerCourse.com  Fact Sheet for Healthcare Providers: SeriousBroker.it  This test is not yet approved or cleared by the [place] FDA and has been authorized for detection and/or diagnosis of SARS-CoV-2 by FDA under an Emergency Use Authorization (EUA). This EUA will remain in effect (meaning this test can be used) for the duration of the COVID-19 declaration under Section 564(b)(1) of the Act, 21 U.S.C. section 360bbb-3(b)(1), unless the authorization is terminated or revoked.     Resp Syncytial Virus by PCR NEGATIVE NEGATIVE Final    Comment: (NOTE) Fact Sheet for Patients: BloggerCourse.com  Fact Sheet for Healthcare Providers: SeriousBroker.it  This test is not yet approved or cleared by the [place] FDA and has been authorized for detection and/or diagnosis of  SARS-CoV-2 by FDA under an Emergency Use Authorization (EUA). This EUA will remain in effect (meaning this test can be used) for the duration of the COVID-19 declaration under Section 564(b)(1) of the Act, 21 U.S.C. section 360bbb-3(b)(1), unless the authorization is terminated or revoked.  Performed at [facility], [address] [place], [place] [address]   Blood Culture (routine x 2)     Status: None (Preliminary result)   Collection Time: 05/19/23  2:21 PM   Specimen: BLOOD  Result Value Ref Range Status   Specimen Description BLOOD RIGHT ANTECUBITAL  Final   Special Requests   Final    Blood Culture results may not be optimal due to an inadequate volume of blood received in culture bottles BOTTLES DRAWN AEROBIC ONLY   Culture   Final    NO GROWTH < 24 HOURS Performed at [facility], [address] [place], [place] [address]    Report Status PENDING  Incomplete  Blood Culture (routine x 2)     Status: None (Preliminary result)   Collection Time: 05/19/23  3:42 PM   Specimen: BLOOD  Result Value Ref Range Status   Specimen Description BLOOD BLOOD RIGHT HAND  Final  Special Requests   Final    Blood Culture adequate volume BOTTLES DRAWN AEROBIC AND ANAEROBIC   Culture   Final    NO GROWTH < 24 HOURS Performed at [facility], [address] [place], [place] [address]    Report Status PENDING  Incomplete  MRSA Next Gen by PCR, Nasal     Status: Abnormal   Collection Time: 05/19/23  5:55 PM   Specimen: Nasal Mucosa; Nasal Swab  Result Value Ref Range Status   MRSA by PCR Next Gen DETECTED (A) NOT DETECTED Final    Comment: RESULT CALLED TO, READ BACK BY AND VERIFIED WITH: REDMAN,A @ 2302 ON 05/19/23 BY JUW (NOTE) The GeneXpert MRSA Assay (FDA approved for NASAL specimens only), is one component of a comprehensive MRSA colonization surveillance program. It is not intended to diagnose MRSA infection nor to guide or monitor treatment for MRSA infections. Test performance  is not FDA approved in patients less than 10 years old. Performed at [facility], [address] [place], [place] [address]     Labs: CBC: Recent Labs  Lab 05/19/23 1421 05/20/23 0541  WBC 8.8 7.5  NEUTROABS 6.3  --   HGB 12.4* 10.9*  HCT 40.7 34.7*  MCV 96.4 93.8  PLT 197 157   Basic Metabolic Panel: Recent Labs  Lab 05/19/23 1421 05/20/23 0541  NA 139 136  K >7.5* 4.5  CL 109 100  CO2 13* 23  GLUCOSE 93 88  BUN 96* 41*  CREATININE 7.65* 4.29*  CALCIUM 8.6* 8.2*   Liver Function Tests: Recent Labs  Lab 05/19/23 1421  AST 17  ALT 24  ALKPHOS 161*  BILITOT 0.8  PROT 7.0  ALBUMIN 3.3*   CBG: Recent Labs  Lab 05/19/23 2138  GLUCAP 82    Discharge time spent: greater than 30 minutes.  Signed: Kendell Bane, MD [facility] 05/20/2023

## 2023-05-20 NOTE — TOC Transition Note (Signed)
Transition of Care [facility]) - CM/SW Discharge Note   Patient Details  Name: [name] [name] MRN: [id number] Date of Birth: 1960-12-04  Transition of Care (TOC) CM/SW Contact:  Catalina Gravel, LCSW Phone Number: 05/20/2023, 11:56 AM   Clinical Narrative:     Pt clear for DC. CSW completed C44 /Moon at bedside with pt. No further TOC needs.   Final next level of care: Home/Self Care Barriers to Discharge: No Barriers Identified   Patient Goals and CMS Choice      Discharge Placement                         Discharge Plan and Services Additional resources added to the After Visit Summary for   In-house Referral: Clinical Social Work                                   Social Determinants of Health (SDOH) Interventions SDOH Screenings   Food Insecurity: No Food Insecurity (03/06/2023)  Housing: Patient Unable To Answer (03/06/2023)  Transportation Needs: No Transportation Needs (03/06/2023)  Utilities: Not At Risk (03/06/2023)  Depression (PHQ2-9): Low Risk  (09/18/2018)  Tobacco Use: Low Risk  (05/19/2023)     Readmission Risk Interventions    03/29/2023    1:45 PM 03/07/2023    1:31 PM  Readmission Risk Prevention Plan  Transportation Screening Complete Complete  HRI or Home Care Consult  Complete  Palliative Care Screening  Not Applicable  Medication Review (RN Care Manager) Complete Complete  PCP or Specialist appointment within 3-5 days of discharge Complete   HRI or Home Care Consult Complete   SW Recovery Care/Counseling Consult Complete   Palliative Care Screening Complete   Skilled Nursing Facility Complete

## 2023-05-20 NOTE — Progress Notes (Signed)
1200 Reached out to provider regarding pt endorsing nausea requesting zofran prior to DC. Patient medicated, DC instructions reviewed.   At 1300 patient being taken out via wheelchair for DC and had large episode of emesis in hallway. Patient transported back to room. MD Shahmehdi informed and new set of vitals taken and relayed to provider.

## 2023-05-20 NOTE — Hospital Course (Signed)
[name] [name]  is a 62 y.o. male, with past medical history significant for ESRD on hemodialysis MWF, hypertension, diabetes type 2, pulmonary hypertension, cardiomyopathy, GERD, dyslipidemia, anemia of CKD, with recent hospitalization secondary to COVID-pneumonia, and MRSA and enterococcal bacteremia.  Mitral valve endocarditis, he finished his IV vancomycin course on 05/02/2023.  -Patient was brought with his wife to ED secondary to weakness, wife report patient has been having diarrhea for the last week, poor appetite, poor oral intake, generalized weakness and deconditioning, last time he had his dialysis 05/12/2023, have any further dialysis since, has any chest pain, fever, chills, shortness of breath. ED; Noted for elevated potassium> 7.5, chest x-ray significant for volume overload versus opacity, EKG with peaked T waves, troponins elevated at 110, he denied any chest pain or shortness of breath, renal consulted for emergent HD, and [facility] hospitalist consulted to admit.    Assessment & Plan:     Principal Problem:   Hyperkalemia Active Problems:   Uncontrolled type 2 diabetes mellitus with hyperglycemia (HCC)   Hyperlipidemia associated with type 2 diabetes mellitus (HCC)   Essential hypertension   Anemia of chronic disease   Dilated cardiomyopathy (HCC)   Anemia of chronic renal failure   Hypothyroidism   ESRD (end stage renal disease) (HCC)   Endocarditis of mitral valve   Severe hyperkalemia -Secondary to missing HD, last one was 11/29 -He is with severe hyperkalemia, with EKG changes, he received temporizing measures in the ED -Discussed with renal, patient will go for emergent hemodialysis to lower his potassium -Will admit to ICU and monitor on telemetry   Elevated troponins -He denies any chest pain, this is secondary to ESRD and volume overload and missing HD   Diarrhea -Will check for C. difficile given recent history of IV antibiotics treatment   Recent MRSA and  Enterococcus bacteremia, Mitral Valve Endocarditis, vegetation on [facility] -Was treated with IV antibiotics until 05/02/2023, is afebrile with no leukocytosis -follow blood culture sent on admission   ESRD on hemodialysis MWF -renal consulted, for emergent HD today. -Recent excision of AV fistula due to infection -Right upper chest HD catheter    Hypertension -Will hold metoprolol and amlodipine for now to allow more blood pressure room for diuresis, and will resume after dialysis, will add as needed hydralazine   Diabetes type 2 Continue with sliding scale insulin   Hypothyroidism Continue with Synthroid   GERD Continue with PPI

## 2023-05-20 NOTE — Progress Notes (Signed)
Per MD Shahmehdi continue with DC. Sent in prescription for zofran prn.

## 2023-05-20 NOTE — Care Management CC44 (Signed)
Condition Code 44 Documentation Completed  Patient Details  Name: Taj Benard MRN: [id number] Date of Birth: 1960-10-28   Condition Code 44 given:  Yes Patient signature on Condition Code 44 notice:  Yes Documentation of 2 MD's agreement:  Yes Code 44 added to claim:  Yes    Matika Bartell Marsh Dolly, LCSW 05/20/2023, 11:47 AM

## 2023-05-21 LAB — BLOOD CULTURE ID PANEL (REFLEXED) - BCID2

## 2023-05-21 LAB — CULTURE, BLOOD (ROUTINE X 2): Special Requests: ADEQUATE

## 2023-05-22 DIAGNOSIS — N2581 Secondary hyperparathyroidism of renal origin: Secondary | ICD-10-CM | POA: Diagnosis not present

## 2023-05-22 DIAGNOSIS — Z992 Dependence on renal dialysis: Secondary | ICD-10-CM | POA: Diagnosis not present

## 2023-05-22 DIAGNOSIS — N186 End stage renal disease: Secondary | ICD-10-CM | POA: Diagnosis not present

## 2023-05-23 DIAGNOSIS — I120 Hypertensive chronic kidney disease with stage 5 chronic kidney disease or end stage renal disease: Secondary | ICD-10-CM | POA: Diagnosis not present

## 2023-05-23 DIAGNOSIS — I279 Pulmonary heart disease, unspecified: Secondary | ICD-10-CM | POA: Diagnosis not present

## 2023-05-23 DIAGNOSIS — E785 Hyperlipidemia, unspecified: Secondary | ICD-10-CM | POA: Diagnosis not present

## 2023-05-23 DIAGNOSIS — N186 End stage renal disease: Secondary | ICD-10-CM | POA: Diagnosis not present

## 2023-05-23 DIAGNOSIS — G9341 Metabolic encephalopathy: Secondary | ICD-10-CM | POA: Diagnosis not present

## 2023-05-23 DIAGNOSIS — D631 Anemia in chronic kidney disease: Secondary | ICD-10-CM | POA: Diagnosis not present

## 2023-05-23 DIAGNOSIS — I255 Ischemic cardiomyopathy: Secondary | ICD-10-CM | POA: Diagnosis not present

## 2023-05-23 DIAGNOSIS — E039 Hypothyroidism, unspecified: Secondary | ICD-10-CM | POA: Diagnosis not present

## 2023-05-23 DIAGNOSIS — E1122 Type 2 diabetes mellitus with diabetic chronic kidney disease: Secondary | ICD-10-CM | POA: Diagnosis not present

## 2023-05-24 ENCOUNTER — Telehealth: Payer: Self-pay

## 2023-05-24 DIAGNOSIS — N186 End stage renal disease: Secondary | ICD-10-CM | POA: Diagnosis not present

## 2023-05-24 DIAGNOSIS — Z992 Dependence on renal dialysis: Secondary | ICD-10-CM | POA: Diagnosis not present

## 2023-05-24 DIAGNOSIS — N2581 Secondary hyperparathyroidism of renal origin: Secondary | ICD-10-CM | POA: Diagnosis not present

## 2023-05-24 LAB — CULTURE, BLOOD (ROUTINE X 2)

## 2023-05-24 LAB — CULTURE, BLOOD (SINGLE)
MICRO NUMBER:: 15818701
MICRO NUMBER:: 15818702
SPECIMEN QUALITY:: ADEQUATE

## 2023-05-24 NOTE — Telephone Encounter (Signed)
-----   Message from [place] sent at 05/24/2023 10:52 AM EST ----- Good news that blood cultures are no growth and final ----- Message ----- From: Janace Hoard Lab Results In Sent: 05/19/2023  10:02 PM EST To: Randall Hiss, MD

## 2023-05-24 NOTE — Telephone Encounter (Signed)
Left voicemail informing patient blood cultures did not show any growth. Requested he call back with any questions. Patient will also need to schedule echocardiogram ordered at appointment.  Juanita Laster, RMA

## 2023-05-26 DIAGNOSIS — N2581 Secondary hyperparathyroidism of renal origin: Secondary | ICD-10-CM | POA: Diagnosis not present

## 2023-05-26 DIAGNOSIS — Z992 Dependence on renal dialysis: Secondary | ICD-10-CM | POA: Diagnosis not present

## 2023-05-26 DIAGNOSIS — N186 End stage renal disease: Secondary | ICD-10-CM | POA: Diagnosis not present

## 2023-05-29 DIAGNOSIS — N2581 Secondary hyperparathyroidism of renal origin: Secondary | ICD-10-CM | POA: Diagnosis not present

## 2023-05-29 DIAGNOSIS — N186 End stage renal disease: Secondary | ICD-10-CM | POA: Diagnosis not present

## 2023-05-29 DIAGNOSIS — Z992 Dependence on renal dialysis: Secondary | ICD-10-CM | POA: Diagnosis not present

## 2023-05-31 DIAGNOSIS — N186 End stage renal disease: Secondary | ICD-10-CM | POA: Diagnosis not present

## 2023-05-31 DIAGNOSIS — Z992 Dependence on renal dialysis: Secondary | ICD-10-CM | POA: Diagnosis not present

## 2023-05-31 DIAGNOSIS — N2581 Secondary hyperparathyroidism of renal origin: Secondary | ICD-10-CM | POA: Diagnosis not present

## 2023-06-01 ENCOUNTER — Ambulatory Visit (INDEPENDENT_AMBULATORY_CARE_PROVIDER_SITE_OTHER): Payer: Medicare HMO | Admitting: Gastroenterology

## 2023-06-01 VITALS — BP 125/75 | HR 55 | Temp 97.8°F | Ht 70.5 in | Wt 170.0 lb

## 2023-06-01 DIAGNOSIS — Z8719 Personal history of other diseases of the digestive system: Secondary | ICD-10-CM

## 2023-06-01 DIAGNOSIS — R112 Nausea with vomiting, unspecified: Secondary | ICD-10-CM | POA: Diagnosis not present

## 2023-06-01 DIAGNOSIS — R1310 Dysphagia, unspecified: Secondary | ICD-10-CM | POA: Insufficient documentation

## 2023-06-01 DIAGNOSIS — R1013 Epigastric pain: Secondary | ICD-10-CM | POA: Insufficient documentation

## 2023-06-01 MED ORDER — PANTOPRAZOLE SODIUM 40 MG PO TBEC: 40.0000 mg | DELAYED_RELEASE_TABLET | Freq: Every day | ORAL | 3 refills | Status: DC

## 2023-06-01 NOTE — Progress Notes (Signed)
Gastroenterology Office Note     Primary Care Physician:  Farris Has, MD  Primary Gastroenterologist: Dr. Tasia Catchings   Chief Complaint   Chief Complaint  Patient presents with   Vomiting    Vomiting started about one week ago. Everything he eats makes his stomach hurt except this morning and no vomiting this morning. Up until today has been vomiting about once daily. Tried zofran and did not help.      History of Present Illness   [name] [name] is a 61 y.o. male presenting today with a history of cardiomyopathy, DM, GERD, HTN, pulmonary HTN, ESRD on HD ( MWF), hospitalized in July 2024 with rectal bleeding requiring blood transfusion s/p colonoscopy with findings of large rectal ulcer, prior imaging CT with incidental pancreatic lesions suspecting IPMN, liver lesion indeterminate, was to have CT abdomen pelvis with and without contrast. He will need to have dialysis the following day following CT scan.   He was to have early interval colonoscopy to rule out malignancy, with differentials including stercoral colitis or solitary ulcer.   Mother is present today as he is a difficult historian at times.   He presents today with one week of vomiting and inability to tolerate diet. However, this morning ate a bacon, egg, cheese biscuit without issues. Yesterday cheese sandwich and came back up. Vomiting at dialysis as well. Had to leave an hour early. Symptoms about a week with abdominal pain constantly, N/V of food. Zofran did not help with vomiting. No vomiting today. Feels a knot in esophagus. Occasional reflux. Drinking Mylanta. Eating chewable alka seltzer. +flatus. No abdominal distension.   Has had constipation in the past and taking stool softeners. Prior to constipation had diarrhea, but this has resolved.   Last GNF:[facility] long time ago.  Last Colonoscopy:01/2023   - Preparation of the colon was inadequate. - Palpable rectal ulcer found on digital rectal exam on the posterior  wall.   - Stool in the entire examined colon.  - Cratered large Mucosal ulceration in the rectum likely the source of bleeding. Biopsied.  - Exam was inadequate even after large amount of lavage and manual disimpaction   - No overt bleeding seen throughout the colon   A. RECTAL ULCER, BIOPSY:  Acute colitis with nonspecific ulcer and hyperplastic change (see  comment)      Past Medical History:  Diagnosis Date   Anemia    Arthritis    Cardiomyopathy (HCC)    July 2022   Diabetes mellitus [facility])    Dialysis patient [facility])    GERD (gastroesophageal reflux disease)    HTN (hypertension)    Hyperlipidemia    Nephrotic syndrome    Obesity    Pulmonary hypertension (HCC)    July 2022 in setting of nephrotic syndrome   Secondary hyperparathyroidism of renal origin [facility])     Past Surgical History:  Procedure Laterality Date   AV FISTULA PLACEMENT Left 12/27/2022   Procedure: INSERTION OF LEFT ARM ARTERIOVENOUS (AV) GORE-TEX GRAFT;  Surgeon: Chuck Hint, MD;  Location: [facility] OR;  Service: Vascular;  Laterality: Left;   AVGG REMOVAL Left 03/15/2023   Procedure: REMOVAL OF LEFT UPPER ARM ARTERIOVENOUS GORETEX GRAFT (AVGG);  Surgeon: [name], MD;  Location: [facility] OR;  Service: Vascular;  Laterality: Left;   BIOPSY  01/13/2023   Procedure: BIOPSY;  Surgeon: Franky Macho, MD;  [facility];  Service: Endoscopy;;   CATARACT EXTRACTION, BILATERAL  2018   COLONOSCOPY WITH PROPOFOL  N/A 01/13/2023   Procedure: COLONOSCOPY WITH PROPOFOL;  Surgeon: Franky Macho, MD;  [facility]: [facility];  Service: Endoscopy;  Laterality: N/A;   INSERTION OF DIALYSIS CATHETER Right 12/27/2022   Procedure: INSERTION OF Right Internal Jugular  DIALYSIS CATHETER;  Surgeon: Chuck Hint, MD;  Location: [facility] OR;  Service: Vascular;  Laterality: Right;   INSERTION OF DIALYSIS CATHETER N/A 03/15/2023   Procedure: INSERTION OF TUNNELED DIALYSIS CATHETER;  Surgeon: [name],  MD;  Location: [facility] OR;  Service: Vascular;  Laterality: N/A;   INSERTION OF DIALYSIS CATHETER Right 03/25/2023   Procedure: INSERTION OF RIGHT TUNNELED DIALYSIS CATHETER;  Surgeon: Daria Pastures, MD;  Location: [facility] OR;  Service: Vascular;  Laterality: Right;   TEE WITHOUT CARDIOVERSION N/A 03/20/2023   Procedure: TRANSESOPHAGEAL ECHOCARDIOGRAM;  Surgeon: Sande Rives, MD;  Location: [facility] OR;  Service: Cardiovascular;  Laterality: N/A;   TONSILLECTOMY      Current Outpatient Medications  Medication Sig Dispense Refill   Accu-Chek Softclix Lancets lancets      acetaminophen (TYLENOL) 500 MG tablet Take 500 mg by mouth every 6 (six) hours as needed (arthritis).      amLODipine (NORVASC) 10 MG tablet Take 1 tablet (10 mg total) by mouth every morning. 30 tablet 0   atorvastatin (LIPITOR) 20 MG tablet Take 20 mg by mouth every morning.     Cyanocobalamin (VITAMIN B12 PO) Take by mouth daily.     Docusate Calcium (STOOL SOFTENER PO) Take by mouth. One as needed     hydrALAZINE (APRESOLINE) 10 MG tablet Take 1 tablet (10 mg total) by mouth every 8 (eight) hours. 90 tablet 0   levothyroxine (SYNTHROID) 25 MCG tablet Take 1 tablet (25 mcg total) by mouth daily before breakfast. 30 tablet 0   metoprolol tartrate (LOPRESSOR) 50 MG tablet Take 1 tablet (50 mg total) by mouth 2 (two) times daily. 60 tablet 0   Multiple Vitamin (MULTIVITAMIN WITH MINERALS) TABS tablet Take 1 tablet by mouth daily. 30 tablet 0   Probiotic Product (PROBIOTIC DAILY PO) Take by mouth daily.     senna (SENOKOT) 8.6 MG TABS tablet Take 1 tablet by mouth daily as needed for mild constipation.     ondansetron (ZOFRAN-ODT) 4 MG disintegrating tablet Take 1 tablet (4 mg total) by mouth every 8 (eight) hours as needed for nausea or vomiting. (Patient not taking: Reported on 06/01/2023) 20 tablet 2   No current facility-administered medications for this visit.    Allergies as of 06/01/2023   (No Known Allergies)     Family History  Problem Relation Age of Onset   Hypertension Mother    Diabetes Mother    Cancer Father        prostate cancer   Hypertension Maternal Grandfather    Rheum arthritis Maternal Grandmother    CAD Neg Hx     Social History   Socioeconomic History   Marital status: Single    Spouse name: Not on file   Number of children: Not on file   Years of education: Not on file   Highest education level: Not on file  Occupational History   Not on file  Tobacco Use   Smoking status: Never   Smokeless tobacco: Never  Vaping Use   Vaping status: Never Used  Substance and Sexual Activity   Alcohol use: Never   Drug use: Never   Sexual activity: Not on file  Other Topics Concern   Not on file  Social History Narrative   Not on file   Social Drivers of Health   Financial Resource Strain: Not on file  Food Insecurity: No Food Insecurity (03/06/2023)   Hunger Vital Sign    Worried About Running Out of Food in the Last Year: Never true    Ran Out of Food in the Last Year: Never true  Transportation Needs: No Transportation Needs (03/06/2023)   PRAPARE - Administrator, Civil Service (Medical): No    Lack of Transportation (Non-Medical): No  Physical Activity: Not on file  Stress: Not on file  Social Connections: Not on file  Intimate Partner Violence: Not At Risk (03/06/2023)   Humiliation, Afraid, Rape, and Kick questionnaire    Fear of Current or Ex-Partner: No    Emotionally Abused: No    Physically Abused: No    Sexually Abused: No     Review of Systems   Gen: Denies any fever, chills, fatigue, weight loss, lack of appetite.  CV: Denies chest pain, heart palpitations, peripheral edema, syncope.  Resp: Denies shortness of breath at rest or with exertion. Denies wheezing or cough.  GI: see HPI GU : Denies urinary burning, urinary frequency, urinary hesitancy MS: in wheelchair Derm: Denies rash, itching, dry skin Psych: Denies depression,  anxiety, memory loss, and confusion Heme: Denies bruising, bleeding, and enlarged lymph nodes.   Physical Exam   BP 125/75 (BP Location: Left Arm, Patient Position: Sitting, Cuff Size: Normal)   Pulse (!) 55   Temp 97.8 F (36.6 C) (Oral)   Ht 5' 10.5" (1.791 m)   Wt 170 lb (77.1 kg)   BMI 24.05 kg/m  General:   Alert and oriented. Pleasant and cooperative. Thin. Chronically ill-appearing Head:  Normocephalic and atraumatic. Eyes:  Without icterus Abdomen:  +BS, soft, non-tender and non-distended. No HSM noted. No guarding or rebound. No masses appreciated.  Rectal:  Deferred  Msk:  Symmetrical, thin extremities.  Extremities:  Without edema. Neurologic:  Alert and  oriented x4;  grossly normal neurologically. Skin:  Intact without significant lesions or rashes. Psych:  Alert and cooperative. Normal mood and affect.   Assessment   [name] [name] is a 62 y.o. male presenting today with a history of cardiomyopathy, DM, GERD, HTN, pulmonary HTN, ESRD on HD ( MWF), hospitalized in July 2024 with rectal bleeding requiring blood transfusion s/p colonoscopy with findings of large rectal ulcer, prior imaging CT with incidental pancreatic lesions suspecting IPMN, liver lesion indeterminate, now with one week history of vomiting and abdominal pain.    Nausea/vomiting: with regurgitation and difficulty with solid foods. Interestingly, he was able ot eat a bacon/egg/cheese biscuit this morning but has had no success prior to this. No improvement with Zofran, and he notes a "knot" in esophagus. Multiple OTC agents to attempt to help ease symptoms without relief. Last EGD perhaps many years ago but unknown findings. He has not been on a PPI, so this could be simply uncontrolled GERD flare but need to evaluate for occult etiology.     As he has had difficulty tolerating diet, will arrange EGD +/- dilation expeditiously.  Regarding other chronic issues, he can return to see Dr. Tasia Catchings to follow-up  on this. He still has colonoscopy that needs to be completed due to rectal ulcer and imaging for liver lesion. This can be addressed after acute symptoms are evaluated.      PLAN    Proceed with upper endoscopy/dilation expeditiously in next available: the risks, benefits, and  alternatives have been discussed with the patient in detail. The patient states understanding and desires to proceed.  START taking PPI daily. Pantoprazole sent to pharmacy.  Will need to return to clinic to arrange elective colonoscopy once upper GI addressed. Imaging will need to be arranged for liver lesion as outpatient.  Will arrange early interval outpatient follow-up.    Gelene Mink, PhD, ANP-BC [facility]

## 2023-06-01 NOTE — H&P (View-Only) (Signed)
Gastroenterology Office Note     Primary Care Physician:  Farris Has, MD  Primary Gastroenterologist: Dr. Tasia Catchings   Chief Complaint   Chief Complaint  Patient presents with   Vomiting    Vomiting started about one week ago. Everything he eats makes his stomach hurt except this morning and no vomiting this morning. Up until today has been vomiting about once daily. Tried zofran and did not help.      History of Present Illness   [name] [name] is a 61 y.o. male presenting today with a history of cardiomyopathy, DM, GERD, HTN, pulmonary HTN, ESRD on HD ( MWF), hospitalized in July 2024 with rectal bleeding requiring blood transfusion s/p colonoscopy with findings of large rectal ulcer, prior imaging CT with incidental pancreatic lesions suspecting IPMN, liver lesion indeterminate, was to have CT abdomen pelvis with and without contrast. He will need to have dialysis the following day following CT scan.   He was to have early interval colonoscopy to rule out malignancy, with differentials including stercoral colitis or solitary ulcer.   Mother is present today as he is a difficult historian at times.   He presents today with one week of vomiting and inability to tolerate diet. However, this morning ate a bacon, egg, cheese biscuit without issues. Yesterday cheese sandwich and came back up. Vomiting at dialysis as well. Had to leave an hour early. Symptoms about a week with abdominal pain constantly, N/V of food. Zofran did not help with vomiting. No vomiting today. Feels a knot in esophagus. Occasional reflux. Drinking Mylanta. Eating chewable alka seltzer. +flatus. No abdominal distension.   Has had constipation in the past and taking stool softeners. Prior to constipation had diarrhea, but this has resolved.   Last GNF:[facility] long time ago.  Last Colonoscopy:01/2023   - Preparation of the colon was inadequate. - Palpable rectal ulcer found on digital rectal exam on the posterior  wall.   - Stool in the entire examined colon.  - Cratered large Mucosal ulceration in the rectum likely the source of bleeding. Biopsied.  - Exam was inadequate even after large amount of lavage and manual disimpaction   - No overt bleeding seen throughout the colon   A. RECTAL ULCER, BIOPSY:  Acute colitis with nonspecific ulcer and hyperplastic change (see  comment)      Past Medical History:  Diagnosis Date   Anemia    Arthritis    Cardiomyopathy (HCC)    July 2022   Diabetes mellitus [facility])    Dialysis patient [facility])    GERD (gastroesophageal reflux disease)    HTN (hypertension)    Hyperlipidemia    Nephrotic syndrome    Obesity    Pulmonary hypertension (HCC)    July 2022 in setting of nephrotic syndrome   Secondary hyperparathyroidism of renal origin [facility])     Past Surgical History:  Procedure Laterality Date   AV FISTULA PLACEMENT Left 12/27/2022   Procedure: INSERTION OF LEFT ARM ARTERIOVENOUS (AV) GORE-TEX GRAFT;  Surgeon: Chuck Hint, MD;  Location: [facility] OR;  Service: Vascular;  Laterality: Left;   AVGG REMOVAL Left 03/15/2023   Procedure: REMOVAL OF LEFT UPPER ARM ARTERIOVENOUS GORETEX GRAFT (AVGG);  Surgeon: [name], MD;  Location: [facility] OR;  Service: Vascular;  Laterality: Left;   BIOPSY  01/13/2023   Procedure: BIOPSY;  Surgeon: Franky Macho, MD;  [facility];  Service: Endoscopy;;   CATARACT EXTRACTION, BILATERAL  2018   COLONOSCOPY WITH PROPOFOL  N/A 01/13/2023   Procedure: COLONOSCOPY WITH PROPOFOL;  Surgeon: Franky Macho, MD;  [facility];  Service: Endoscopy;  Laterality: N/A;   INSERTION OF DIALYSIS CATHETER Right 12/27/2022   Procedure: INSERTION OF Right Internal Jugular  DIALYSIS CATHETER;  Surgeon: Chuck Hint, MD;  Location: [facility] OR;  Service: Vascular;  Laterality: Right;   INSERTION OF DIALYSIS CATHETER N/A 03/15/2023   Procedure: INSERTION OF TUNNELED DIALYSIS CATHETER;  Surgeon: [name],  MD;  Location: [facility] OR;  Service: Vascular;  Laterality: N/A;   INSERTION OF DIALYSIS CATHETER Right 03/25/2023   Procedure: INSERTION OF RIGHT TUNNELED DIALYSIS CATHETER;  Surgeon: Daria Pastures, MD;  Location: [facility] OR;  Service: Vascular;  Laterality: Right;   TEE WITHOUT CARDIOVERSION N/A 03/20/2023   Procedure: TRANSESOPHAGEAL ECHOCARDIOGRAM;  Surgeon: Sande Rives, MD;  Location: [facility] OR;  Service: Cardiovascular;  Laterality: N/A;   TONSILLECTOMY      Current Outpatient Medications  Medication Sig Dispense Refill   Accu-Chek Softclix Lancets lancets      acetaminophen (TYLENOL) 500 MG tablet Take 500 mg by mouth every 6 (six) hours as needed (arthritis).      amLODipine (NORVASC) 10 MG tablet Take 1 tablet (10 mg total) by mouth every morning. 30 tablet 0   atorvastatin (LIPITOR) 20 MG tablet Take 20 mg by mouth every morning.     Cyanocobalamin (VITAMIN B12 PO) Take by mouth daily.     Docusate Calcium (STOOL SOFTENER PO) Take by mouth. One as needed     hydrALAZINE (APRESOLINE) 10 MG tablet Take 1 tablet (10 mg total) by mouth every 8 (eight) hours. 90 tablet 0   levothyroxine (SYNTHROID) 25 MCG tablet Take 1 tablet (25 mcg total) by mouth daily before breakfast. 30 tablet 0   metoprolol tartrate (LOPRESSOR) 50 MG tablet Take 1 tablet (50 mg total) by mouth 2 (two) times daily. 60 tablet 0   Multiple Vitamin (MULTIVITAMIN WITH MINERALS) TABS tablet Take 1 tablet by mouth daily. 30 tablet 0   Probiotic Product (PROBIOTIC DAILY PO) Take by mouth daily.     senna (SENOKOT) 8.6 MG TABS tablet Take 1 tablet by mouth daily as needed for mild constipation.     ondansetron (ZOFRAN-ODT) 4 MG disintegrating tablet Take 1 tablet (4 mg total) by mouth every 8 (eight) hours as needed for nausea or vomiting. (Patient not taking: Reported on 06/01/2023) 20 tablet 2   No current facility-administered medications for this visit.    Allergies as of 06/01/2023   (No Known Allergies)     Family History  Problem Relation Age of Onset   Hypertension Mother    Diabetes Mother    Cancer Father        prostate cancer   Hypertension Maternal Grandfather    Rheum arthritis Maternal Grandmother    CAD Neg Hx     Social History   Socioeconomic History   Marital status: Single    Spouse name: Not on file   Number of children: Not on file   Years of education: Not on file   Highest education level: Not on file  Occupational History   Not on file  Tobacco Use   Smoking status: Never   Smokeless tobacco: Never  Vaping Use   Vaping status: Never Used  Substance and Sexual Activity   Alcohol use: Never   Drug use: Never   Sexual activity: Not on file  Other Topics Concern   Not on file  Social History Narrative   Not on file   Social Drivers of Health   Financial Resource Strain: Not on file  Food Insecurity: No Food Insecurity (03/06/2023)   Hunger Vital Sign    Worried About Running Out of Food in the Last Year: Never true    Ran Out of Food in the Last Year: Never true  Transportation Needs: No Transportation Needs (03/06/2023)   PRAPARE - Administrator, Civil Service (Medical): No    Lack of Transportation (Non-Medical): No  Physical Activity: Not on file  Stress: Not on file  Social Connections: Not on file  Intimate Partner Violence: Not At Risk (03/06/2023)   Humiliation, Afraid, Rape, and Kick questionnaire    Fear of Current or Ex-Partner: No    Emotionally Abused: No    Physically Abused: No    Sexually Abused: No     Review of Systems   Gen: Denies any fever, chills, fatigue, weight loss, lack of appetite.  CV: Denies chest pain, heart palpitations, peripheral edema, syncope.  Resp: Denies shortness of breath at rest or with exertion. Denies wheezing or cough.  GI: see HPI GU : Denies urinary burning, urinary frequency, urinary hesitancy MS: in wheelchair Derm: Denies rash, itching, dry skin Psych: Denies depression,  anxiety, memory loss, and confusion Heme: Denies bruising, bleeding, and enlarged lymph nodes.   Physical Exam   BP 125/75 (BP Location: Left Arm, Patient Position: Sitting, Cuff Size: Normal)   Pulse (!) 55   Temp 97.8 F (36.6 C) (Oral)   Ht 5' 10.5" (1.791 m)   Wt 170 lb (77.1 kg)   BMI 24.05 kg/m  General:   Alert and oriented. Pleasant and cooperative. Thin. Chronically ill-appearing Head:  Normocephalic and atraumatic. Eyes:  Without icterus Abdomen:  +BS, soft, non-tender and non-distended. No HSM noted. No guarding or rebound. No masses appreciated.  Rectal:  Deferred  Msk:  Symmetrical, thin extremities.  Extremities:  Without edema. Neurologic:  Alert and  oriented x4;  grossly normal neurologically. Skin:  Intact without significant lesions or rashes. Psych:  Alert and cooperative. Normal mood and affect.   Assessment   [name] [name] is a 62 y.o. male presenting today with a history of cardiomyopathy, DM, GERD, HTN, pulmonary HTN, ESRD on HD ( MWF), hospitalized in July 2024 with rectal bleeding requiring blood transfusion s/p colonoscopy with findings of large rectal ulcer, prior imaging CT with incidental pancreatic lesions suspecting IPMN, liver lesion indeterminate, now with one week history of vomiting and abdominal pain.    Nausea/vomiting: with regurgitation and difficulty with solid foods. Interestingly, he was able ot eat a bacon/egg/cheese biscuit this morning but has had no success prior to this. No improvement with Zofran, and he notes a "knot" in esophagus. Multiple OTC agents to attempt to help ease symptoms without relief. Last EGD perhaps many years ago but unknown findings. He has not been on a PPI, so this could be simply uncontrolled GERD flare but need to evaluate for occult etiology.     As he has had difficulty tolerating diet, will arrange EGD +/- dilation expeditiously.  Regarding other chronic issues, he can return to see Dr. Tasia Catchings to follow-up  on this. He still has colonoscopy that needs to be completed due to rectal ulcer and imaging for liver lesion. This can be addressed after acute symptoms are evaluated.      PLAN    Proceed with upper endoscopy/dilation expeditiously in next available: the risks, benefits, and  alternatives have been discussed with the patient in detail. The patient states understanding and desires to proceed.  START taking PPI daily. Pantoprazole sent to pharmacy.  Will need to return to clinic to arrange elective colonoscopy once upper GI addressed. Imaging will need to be arranged for liver lesion as outpatient.  Will arrange early interval outpatient follow-up.    Gelene Mink, PhD, ANP-BC [facility] Gastroenterology

## 2023-06-01 NOTE — Patient Instructions (Signed)
We are arranging an upper endoscopy with dilation as soon as possible.  If you are unable to tolerate liquids or have worsening symptoms, please go to the emergency room.  Once this is addressed, we can talk about the colonoscopy and CT scan already planned.  I have also sent in pantoprazole to take once each morning, 30 minutes before breakfast.   Further recommendations to follow!  It was a pleasure to see you today. I want to create trusting relationships with patients and provide genuine, compassionate, and quality care. I truly value your feedback, so please be on the lookout for a survey regarding your visit with me today. I appreciate your time in completing this!         Gelene Mink, PhD, ANP-BC [facility] Gastroenterology

## 2023-06-02 ENCOUNTER — Encounter (HOSPITAL_COMMUNITY)
Admission: RE | Admit: 2023-06-02 | Discharge: 2023-06-02 | Disposition: A | Discharge status: Home / Self Care | Payer: Medicare HMO | Source: Ambulatory Visit | Attending: Internal Medicine | Admitting: Internal Medicine

## 2023-06-02 ENCOUNTER — Encounter (HOSPITAL_COMMUNITY): Payer: Self-pay

## 2023-06-02 DIAGNOSIS — Z992 Dependence on renal dialysis: Secondary | ICD-10-CM | POA: Diagnosis not present

## 2023-06-02 DIAGNOSIS — N2581 Secondary hyperparathyroidism of renal origin: Secondary | ICD-10-CM | POA: Diagnosis not present

## 2023-06-02 DIAGNOSIS — N186 End stage renal disease: Secondary | ICD-10-CM | POA: Diagnosis not present

## 2023-06-04 ENCOUNTER — Telehealth: Payer: Self-pay | Admitting: Gastroenterology

## 2023-06-04 DIAGNOSIS — Z992 Dependence on renal dialysis: Secondary | ICD-10-CM | POA: Diagnosis not present

## 2023-06-04 DIAGNOSIS — N186 End stage renal disease: Secondary | ICD-10-CM | POA: Diagnosis not present

## 2023-06-04 DIAGNOSIS — N2581 Secondary hyperparathyroidism of renal origin: Secondary | ICD-10-CM | POA: Diagnosis not present

## 2023-06-04 NOTE — Telephone Encounter (Signed)
Mitzie: patient has procedure on Dec 23rd. Please arrange early interval follow-up with Dr. Tasia Catchings in next 2 weeks or so. If no availability, let me know, as I last saw him but would like him to see Dr. Tasia Catchings if possible due to change in clinical status.

## 2023-06-05 ENCOUNTER — Ambulatory Visit (HOSPITAL_COMMUNITY): Payer: Medicare HMO | Admitting: Anesthesiology

## 2023-06-05 ENCOUNTER — Telehealth: Payer: Self-pay | Admitting: *Deleted

## 2023-06-05 ENCOUNTER — Encounter (HOSPITAL_COMMUNITY)
Admission: RE | Disposition: A | Discharge status: Home / Self Care | Payer: Self-pay | Source: Home / Self Care | Attending: Internal Medicine

## 2023-06-05 ENCOUNTER — Encounter (HOSPITAL_COMMUNITY): Payer: Self-pay | Admitting: Internal Medicine

## 2023-06-05 ENCOUNTER — Ambulatory Visit (HOSPITAL_COMMUNITY)
Admission: RE | Admit: 2023-06-05 | Discharge: 2023-06-05 | Disposition: A | Discharge status: Home / Self Care | Payer: Medicare HMO | Attending: Internal Medicine | Admitting: Internal Medicine

## 2023-06-05 DIAGNOSIS — R1310 Dysphagia, unspecified: Secondary | ICD-10-CM | POA: Insufficient documentation

## 2023-06-05 DIAGNOSIS — Z539 Procedure and treatment not carried out, unspecified reason: Secondary | ICD-10-CM | POA: Insufficient documentation

## 2023-06-05 DIAGNOSIS — K219 Gastro-esophageal reflux disease without esophagitis: Secondary | ICD-10-CM | POA: Insufficient documentation

## 2023-06-05 DIAGNOSIS — N186 End stage renal disease: Secondary | ICD-10-CM

## 2023-06-05 LAB — POCT I-STAT, CHEM 8
BUN: 17 mg/dL (ref 8–23)
Calcium, Ion: 0.88 mmol/L — CL (ref 1.15–1.40)
Chloride: 101 mmol/L (ref 98–111)
Creatinine, Ser: 3.7 mg/dL — ABNORMAL HIGH (ref 0.61–1.24)
Glucose, Bld: 87 mg/dL (ref 70–99)
HCT: 30 % — ABNORMAL LOW (ref 39.0–52.0)
Hemoglobin: 10.2 g/dL — ABNORMAL LOW (ref 13.0–17.0)
Potassium: 4.1 mmol/L (ref 3.5–5.1)
Sodium: 136 mmol/L (ref 135–145)
TCO2: 26 mmol/L (ref 22–32)

## 2023-06-05 LAB — BASIC METABOLIC PANEL
Anion gap: 10 (ref 5–15)
BUN: 17 mg/dL (ref 8–23)
CO2: 28 mmol/L (ref 22–32)
Calcium: 8.7 mg/dL — ABNORMAL LOW (ref 8.9–10.3)
Chloride: 98 mmol/L (ref 98–111)
Creatinine, Ser: 3.8 mg/dL — ABNORMAL HIGH (ref 0.61–1.24)
GFR, Estimated: 17 mL/min — ABNORMAL LOW (ref >60)
Glucose, Bld: 97 mg/dL (ref 70–99)
Potassium: 3.5 mmol/L (ref 3.5–5.1)
Sodium: 136 mmol/L (ref 135–145)

## 2023-06-05 SURGERY — CANCELLED PROCEDURE
Anesthesia: General

## 2023-06-05 MED ORDER — LACTATED RINGERS IV SOLN: INTRAVENOUS | Status: DC

## 2023-06-05 NOTE — Progress Notes (Signed)
Patient presented for upper endoscopy for dysphagia, GERD, nausea vomiting.  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  Repeat echocardiogram ordered 05/18/2023 but has not been completed yet.  Anesthesia requesting updated echo before proceeding with upper endoscopy.  We will cancel his procedure today.  Follow-up in GI office in 3 to 4 weeks.

## 2023-06-05 NOTE — Anesthesia Preprocedure Evaluation (Signed)
Anesthesia Evaluation  Patient identified by MRN, date of birth, ID band Patient awake    Reviewed: Allergy & Precautions, H&P , NPO status , Patient's Chart, lab work & pertinent test results, reviewed documented beta blocker date and time   Airway Mallampati: II  TM Distance: >3 FB Neck ROM: full    Dental no notable dental hx.    Pulmonary neg pulmonary ROS   Pulmonary exam normal breath sounds clear to auscultation       Cardiovascular Exercise Tolerance: Good hypertension, pulmonary hypertension+CHF   Rhythm:regular Rate:Normal     Neuro/Psych  PSYCHIATRIC DISORDERS      negative neurological ROS  negative psych ROS   GI/Hepatic negative GI ROS, Neg liver ROS,GERD  ,,  Endo/Other  negative endocrine ROSdiabetesHypothyroidism    Renal/GU ESRF and DialysisRenal diseasenegative Renal ROS  negative genitourinary   Musculoskeletal   Abdominal   Peds  Hematology negative hematology ROS (+) Blood dyscrasia, anemia   Anesthesia Other Findings   Reproductive/Obstetrics negative OB ROS                             Anesthesia Physical Anesthesia Plan  ASA: 3  Anesthesia Plan: General   Post-op Pain Management:    Induction:   PONV Risk Score and Plan: Propofol infusion  Airway Management Planned:   Additional Equipment:   Intra-op Plan:   Post-operative Plan:   Informed Consent: I have reviewed the patients History and Physical, chart, labs and discussed the procedure including the risks, benefits and alternatives for the proposed anesthesia with the patient or authorized representative who has indicated his/her understanding and acceptance.     Dental Advisory Given  Plan Discussed with: CRNA  Anesthesia Plan Comments:        Anesthesia Quick Evaluation

## 2023-06-05 NOTE — Telephone Encounter (Signed)
Lillia Mountain, RN:  Dr Johnnette Litter cancelled Mr Hor today. He needs a repeat echo and cardiac clearance.

## 2023-06-05 NOTE — Interval H&P Note (Signed)
History and Physical Interval Note:  06/05/2023 11:37 AM  [name] [name]  has presented today for surgery, with the diagnosis of n/v, dyspepsia.  The various methods of treatment have been discussed with the patient and family. After consideration of risks, benefits and other options for treatment, the patient has consented to  Procedure(s) with comments: ESOPHAGOGASTRODUODENOSCOPY (EGD) WITH PROPOFOL (N/A) - 1:00pm;asa 3 BALLOON DILATION (N/A) - 1:00pm;asa 3 as a surgical intervention.  The patient's history has been reviewed, patient examined, no change in status, stable for surgery.  I have reviewed the patient's chart and labs.  Questions were answered to the patient's satisfaction.     Lanelle Bal

## 2023-06-06 DIAGNOSIS — Z992 Dependence on renal dialysis: Secondary | ICD-10-CM | POA: Diagnosis not present

## 2023-06-06 DIAGNOSIS — N186 End stage renal disease: Secondary | ICD-10-CM | POA: Diagnosis not present

## 2023-06-06 DIAGNOSIS — N2581 Secondary hyperparathyroidism of renal origin: Secondary | ICD-10-CM | POA: Diagnosis not present

## 2023-06-09 DIAGNOSIS — N186 End stage renal disease: Secondary | ICD-10-CM | POA: Diagnosis not present

## 2023-06-09 DIAGNOSIS — Z992 Dependence on renal dialysis: Secondary | ICD-10-CM | POA: Diagnosis not present

## 2023-06-09 DIAGNOSIS — N2581 Secondary hyperparathyroidism of renal origin: Secondary | ICD-10-CM | POA: Diagnosis not present

## 2023-06-13 DIAGNOSIS — N2581 Secondary hyperparathyroidism of renal origin: Secondary | ICD-10-CM | POA: Diagnosis not present

## 2023-06-13 DIAGNOSIS — Z992 Dependence on renal dialysis: Secondary | ICD-10-CM | POA: Diagnosis not present

## 2023-06-13 DIAGNOSIS — N186 End stage renal disease: Secondary | ICD-10-CM | POA: Diagnosis not present

## 2023-06-13 DIAGNOSIS — E1122 Type 2 diabetes mellitus with diabetic chronic kidney disease: Secondary | ICD-10-CM | POA: Diagnosis not present

## 2023-06-21 ENCOUNTER — Telehealth (INDEPENDENT_AMBULATORY_CARE_PROVIDER_SITE_OTHER): Payer: Self-pay | Admitting: Internal Medicine

## 2023-06-21 NOTE — Telephone Encounter (Signed)
 Pt mother Doristine Counter called and states that pt does not want to have EGD done at this time. Mom will call back if they want to reschedule

## 2023-06-29 ENCOUNTER — Ambulatory Visit (HOSPITAL_COMMUNITY)
Admission: RE | Admit: 2023-06-29 | Discharge: 2023-06-29 | Disposition: A | Discharge status: Home / Self Care | Payer: HMO | Source: Ambulatory Visit | Attending: Infectious Disease | Admitting: Infectious Disease

## 2023-06-29 DIAGNOSIS — I059 Rheumatic mitral valve disease, unspecified: Secondary | ICD-10-CM

## 2023-06-29 DIAGNOSIS — I38 Endocarditis, valve unspecified: Secondary | ICD-10-CM | POA: Diagnosis not present

## 2023-06-29 DIAGNOSIS — T827XXD Infection and inflammatory reaction due to other cardiac and vascular devices, implants and grafts, subsequent encounter: Secondary | ICD-10-CM | POA: Diagnosis not present

## 2023-06-29 DIAGNOSIS — I272 Pulmonary hypertension, unspecified: Secondary | ICD-10-CM | POA: Diagnosis not present

## 2023-06-29 DIAGNOSIS — R7881 Bacteremia: Secondary | ICD-10-CM

## 2023-06-29 DIAGNOSIS — N186 End stage renal disease: Secondary | ICD-10-CM | POA: Diagnosis not present

## 2023-06-29 DIAGNOSIS — B9562 Methicillin resistant Staphylococcus aureus infection as the cause of diseases classified elsewhere: Secondary | ICD-10-CM

## 2023-06-29 DIAGNOSIS — I429 Cardiomyopathy, unspecified: Secondary | ICD-10-CM | POA: Insufficient documentation

## 2023-06-29 DIAGNOSIS — U071 COVID-19: Secondary | ICD-10-CM

## 2023-06-29 DIAGNOSIS — I120 Hypertensive chronic kidney disease with stage 5 chronic kidney disease or end stage renal disease: Secondary | ICD-10-CM | POA: Diagnosis present

## 2023-06-29 LAB — ECHOCARDIOGRAM COMPLETE
Area-P 1/2: 2.44 cm2
S' Lateral: 3.4 cm

## 2023-07-03 ENCOUNTER — Telehealth: Payer: Self-pay

## 2023-07-03 NOTE — Telephone Encounter (Signed)
-----   Message from [place] sent at 06/30/2023  2:34 PM EST ----- Regarding: FW: Does he have follow up w cardiology? His echo sounds stable ----- Message ----- From: Interface, [employer] Sent: 06/29/2023   2:28 PM EST To: Randall Hiss, MD

## 2023-07-03 NOTE — Telephone Encounter (Signed)
Left voicemail with results. Advised pt follow up with Cardiology as scheduled. Juanita Laster, RMA

## 2023-07-04 ENCOUNTER — Ambulatory Visit: Payer: Medicare HMO | Admitting: Gastroenterology

## 2023-07-11 ENCOUNTER — Ambulatory Visit: Payer: Medicare HMO | Admitting: Gastroenterology

## 2023-07-24 NOTE — H&P (View-Only) (Signed)
 [facility] [place]  ASSESSMENT / PLAN: 63 y.o. male with end-stage renal disease on hemodialysis through tunneled dialysis catheter.  He had a left arm arteriovenous graft completely removed for concern for infection; bacteremia; endocarditis.  No evidence of graft infection was found.  Plan RUE AVG as schedule allows.  CHIEF COMPLAINT: Status post left arm graft excision  HISTORY OF PRESENT ILLNESS: [name] [name] is a 63 y.o. male who returns to clinic for evaluation of left arm arteriovenous graft excision.  He is healed nicely.  He is currently getting dialysis through a right internal jugular tunneled dialysis catheter.  He is still getting IV antibiotics at dialysis treatments.  Patient is sleepy and minimally interactive today.  I removed all the staples.  07/25/23: Returns to care. No longer on antibiotics. Still profoundly deconditioned.   Past Medical History:  Diagnosis Date   Anemia    Arthritis    Cardiomyopathy [facility])    July 2022   Diabetes mellitus [facility])    Dialysis patient [facility])    GERD (gastroesophageal reflux disease)    HTN (hypertension)    Hyperlipidemia    Nephrotic syndrome    Obesity    Pulmonary hypertension (HCC)    July 2022 in setting of nephrotic syndrome   Secondary hyperparathyroidism of renal origin [facility])     Past Surgical History:  Procedure Laterality Date   AV FISTULA PLACEMENT Left 12/27/2022   Procedure: INSERTION OF LEFT ARM ARTERIOVENOUS (AV) GORE-TEX GRAFT;  Surgeon: Chuck Hint, MD;  Location: [facility] OR;  Service: Vascular;  Laterality: Left;   AVGG REMOVAL Left 03/15/2023   Procedure: REMOVAL OF LEFT UPPER ARM ARTERIOVENOUS GORETEX GRAFT (AVGG);  Surgeon: [name], MD;  Location: [facility] OR;  Service: Vascular;  Laterality: Left;   BIOPSY  01/13/2023   Procedure: BIOPSY;  Surgeon: Franky Macho, MD;  [facility];  Service: Endoscopy;;   CATARACT EXTRACTION, BILATERAL  2018   COLONOSCOPY  WITH PROPOFOL N/A 01/13/2023   Procedure: COLONOSCOPY WITH PROPOFOL;  Surgeon: Franky Macho, MD;  [facility];  Service: Endoscopy;  Laterality: N/A;   INSERTION OF DIALYSIS CATHETER Right 12/27/2022   Procedure: INSERTION OF Right Internal Jugular  DIALYSIS CATHETER;  Surgeon: Chuck Hint, MD;  Location: [facility] OR;  Service: Vascular;  Laterality: Right;   INSERTION OF DIALYSIS CATHETER N/A 03/15/2023   Procedure: INSERTION OF TUNNELED DIALYSIS CATHETER;  Surgeon: [name], MD;  Location: [facility] OR;  Service: Vascular;  Laterality: N/A;   INSERTION OF DIALYSIS CATHETER Right 03/25/2023   Procedure: INSERTION OF RIGHT TUNNELED DIALYSIS CATHETER;  Surgeon: Daria Pastures, MD;  Location: [facility] OR;  Service: Vascular;  Laterality: Right;   TEE WITHOUT CARDIOVERSION N/A 03/20/2023   Procedure: TRANSESOPHAGEAL ECHOCARDIOGRAM;  Surgeon: Sande Rives, MD;  Location: [facility] OR;  Service: Cardiovascular;  Laterality: N/A;   TONSILLECTOMY      Family History  Problem Relation Age of Onset   Hypertension Mother    Diabetes Mother    Cancer Father        prostate cancer   Hypertension Maternal Grandfather    Rheum arthritis Maternal Grandmother    CAD Neg Hx     Social History   Socioeconomic History   Marital status: Single    Spouse name: Not on file   Number of children: Not on file   Years of education: Not on file   Highest education level: Not on file  Occupational History   Not on file  Tobacco Use   Smoking status: Never   Smokeless tobacco: Never  Vaping Use   Vaping status: Never Used  Substance and Sexual Activity   Alcohol use: Never   Drug use: Never   Sexual activity: Not on file  Other Topics Concern   Not on file  Social History Narrative   Not on file   Social Drivers of Health   Financial Resource Strain: Not on file  Food Insecurity: No Food Insecurity (03/06/2023)   Hunger Vital Sign    Worried About Running Out of Food in the  Last Year: Never true    Ran Out of Food in the Last Year: Never true  Transportation Needs: No Transportation Needs (03/06/2023)   PRAPARE - Administrator, Civil Service (Medical): No    Lack of Transportation (Non-Medical): No  Physical Activity: Not on file  Stress: Not on file  Social Connections: Not on file  Intimate Partner Violence: Not At Risk (03/06/2023)   Humiliation, Afraid, Rape, and Kick questionnaire    Fear of Current or Ex-Partner: No    Emotionally Abused: No    Physically Abused: No    Sexually Abused: No    No Known Allergies  Current Outpatient Medications  Medication Sig Dispense Refill   Accu-Chek Softclix Lancets lancets      acetaminophen (TYLENOL) 500 MG tablet Take 500 mg by mouth every 6 (six) hours as needed (arthritis).      amLODipine (NORVASC) 10 MG tablet Take 1 tablet (10 mg total) by mouth every morning. 30 tablet 0   atorvastatin (LIPITOR) 20 MG tablet Take 20 mg by mouth every morning.     Cyanocobalamin (VITAMIN B12 PO) Take by mouth daily.     Docusate Calcium (STOOL SOFTENER PO) Take by mouth. One as needed     hydrALAZINE (APRESOLINE) 10 MG tablet Take 1 tablet (10 mg total) by mouth every 8 (eight) hours. 90 tablet 0   levothyroxine (SYNTHROID) 25 MCG tablet Take 1 tablet (25 mcg total) by mouth daily before breakfast. 30 tablet 0   metoprolol tartrate (LOPRESSOR) 50 MG tablet Take 1 tablet (50 mg total) by mouth 2 (two) times daily. 60 tablet 0   Multiple Vitamin (MULTIVITAMIN WITH MINERALS) TABS tablet Take 1 tablet by mouth daily. 30 tablet 0   ondansetron (ZOFRAN-ODT) 4 MG disintegrating tablet Take 1 tablet (4 mg total) by mouth every 8 (eight) hours as needed for nausea or vomiting. 20 tablet 2   pantoprazole (PROTONIX) 40 MG tablet Take 1 tablet (40 mg total) by mouth daily. 30 minutes before breakfast 90 tablet 3   Probiotic Product (PROBIOTIC DAILY PO) Take by mouth daily.     senna (SENOKOT) 8.6 MG TABS tablet Take 1  tablet by mouth daily as needed for mild constipation.     No current facility-administered medications for this visit.    PHYSICAL EXAM Vitals:   07/25/23 1344  BP: (!) 184/85  Pulse: (!) 54  Resp: 20  Temp: 98.1 F (36.7 C)  SpO2: 100%    Chronically ill man in no distress Regular rate and rhythm Unlabored breathing Palpable radial pulses bilaterally  PERTINENT LABORATORY AND RADIOLOGIC DATA  Most recent CBC    Latest Ref Rng & Units 06/05/2023   11:23 AM 05/20/2023    5:41 AM 05/19/2023    2:21 PM  CBC  WBC 4.0 - 10.5 K/uL  7.5  8.8   Hemoglobin  13.0 - 17.0 g/dL 78.2  95.6  21.3   Hematocrit 39.0 - 52.0 % 30.0  34.7  40.7   Platelets 150 - 400 K/uL  157  197      Most recent CMP    Latest Ref Rng & Units 06/05/2023   11:57 AM 06/05/2023   11:23 AM 05/20/2023    5:41 AM  CMP  Glucose 70 - 99 mg/dL 97  87  88   BUN 8 - 23 mg/dL 17  17  41   Creatinine 0.61 - 1.24 mg/dL 0.86  5.78  4.69   Sodium 135 - 145 mmol/L 136  136  136   Potassium 3.5 - 5.1 mmol/L 3.5  4.1  4.5   Chloride 98 - 111 mmol/L 98  101  100   CO2 22 - 32 mmol/L 28   23   Calcium 8.9 - 10.3 mg/dL 8.7   8.2     Renal function CrCl cannot be calculated (Patient's most recent lab result is older than the maximum 21 days allowed.).  Hgb A1c MFr Bld (%)  Date Value  01/11/2023 5.1    LDL Cholesterol  Date Value Ref Range Status  04/27/2020 102 (H) 0 - 99 mg/dL Final    Comment:           Total Cholesterol/HDL:CHD Risk Coronary Heart Disease Risk Table                     Men   Women  1/2 Average Risk   3.4   3.3  Average Risk       5.0   4.4  2 X Average Risk   9.6   7.1  3 X Average Risk  23.4   11.0        Use the calculated Patient Ratio above and the CHD Risk Table to determine the patient's CHD Risk.        ATP III CLASSIFICATION (LDL):  <100     mg/dL   Optimal  629-528  mg/dL   Near or Above                    Optimal  130-159  mg/dL   Borderline  413-244  mg/dL    High  >010     mg/dL   Very High Performed at [facility], [address], [place], [place] [address]      [name]. Lenell Antu, MD [facility] Phone Number: [phone] 07/25/2023 2:08 PM   Total time spent on preparing this encounter including chart review, data review, collecting history, examining the patient, coordinating care for this established patient, 30 minutes.  Portions of this report may have been transcribed using voice recognition software.  Every effort has been made to ensure accuracy; however, inadvertent computerized transcription errors may still be present.

## 2023-07-24 NOTE — Progress Notes (Signed)
[facility] [place]  ASSESSMENT / PLAN: 63 y.o. male with end-stage renal disease on hemodialysis through tunneled dialysis catheter.  He had a left arm arteriovenous graft completely removed for concern for infection; bacteremia; endocarditis.  No evidence of graft infection was found.  Plan RUE AVG as schedule allows.  CHIEF COMPLAINT: Status post left arm graft excision  HISTORY OF PRESENT ILLNESS: [name] [name] is a 63 y.o. male who returns to clinic for evaluation of left arm arteriovenous graft excision.  He is healed nicely.  He is currently getting dialysis through a right internal jugular tunneled dialysis catheter.  He is still getting IV antibiotics at dialysis treatments.  Patient is sleepy and minimally interactive today.  I removed all the staples.  07/25/23: Returns to care. No longer on antibiotics. Still profoundly deconditioned.   Past Medical History:  Diagnosis Date   Anemia    Arthritis    Cardiomyopathy [facility])    July 2022   Diabetes mellitus [facility])    Dialysis patient [facility])    GERD (gastroesophageal reflux disease)    HTN (hypertension)    Hyperlipidemia    Nephrotic syndrome    Obesity    Pulmonary hypertension (HCC)    July 2022 in setting of nephrotic syndrome   Secondary hyperparathyroidism of renal origin [facility])     Past Surgical History:  Procedure Laterality Date   AV FISTULA PLACEMENT Left 12/27/2022   Procedure: INSERTION OF LEFT ARM ARTERIOVENOUS (AV) GORE-TEX GRAFT;  Surgeon: Chuck Hint, MD;  Location: [facility] OR;  Service: Vascular;  Laterality: Left;   AVGG REMOVAL Left 03/15/2023   Procedure: REMOVAL OF LEFT UPPER ARM ARTERIOVENOUS GORETEX GRAFT (AVGG);  Surgeon: [name], MD;  Location: [facility] OR;  Service: Vascular;  Laterality: Left;   BIOPSY  01/13/2023   Procedure: BIOPSY;  Surgeon: Franky Macho, MD;  [facility];  Service: Endoscopy;;   CATARACT EXTRACTION, BILATERAL  2018   COLONOSCOPY  WITH PROPOFOL N/A 01/13/2023   Procedure: COLONOSCOPY WITH PROPOFOL;  Surgeon: Franky Macho, MD;  [facility];  Service: Endoscopy;  Laterality: N/A;   INSERTION OF DIALYSIS CATHETER Right 12/27/2022   Procedure: INSERTION OF Right Internal Jugular  DIALYSIS CATHETER;  Surgeon: Chuck Hint, MD;  Location: [facility] OR;  Service: Vascular;  Laterality: Right;   INSERTION OF DIALYSIS CATHETER N/A 03/15/2023   Procedure: INSERTION OF TUNNELED DIALYSIS CATHETER;  Surgeon: [name], MD;  Location: [facility] OR;  Service: Vascular;  Laterality: N/A;   INSERTION OF DIALYSIS CATHETER Right 03/25/2023   Procedure: INSERTION OF RIGHT TUNNELED DIALYSIS CATHETER;  Surgeon: Daria Pastures, MD;  Location: [facility] OR;  Service: Vascular;  Laterality: Right;   TEE WITHOUT CARDIOVERSION N/A 03/20/2023   Procedure: TRANSESOPHAGEAL ECHOCARDIOGRAM;  Surgeon: Sande Rives, MD;  Location: [facility] OR;  Service: Cardiovascular;  Laterality: N/A;   TONSILLECTOMY      Family History  Problem Relation Age of Onset   Hypertension Mother    Diabetes Mother    Cancer Father        prostate cancer   Hypertension Maternal Grandfather    Rheum arthritis Maternal Grandmother    CAD Neg Hx     Social History   Socioeconomic History   Marital status: Single    Spouse name: Not on file   Number of children: Not on file   Years of education: Not on file   Highest education level: Not on file  Occupational History   Not on file  Tobacco Use   Smoking status: Never   Smokeless tobacco: Never  Vaping Use   Vaping status: Never Used  Substance and Sexual Activity   Alcohol use: Never   Drug use: Never   Sexual activity: Not on file  Other Topics Concern   Not on file  Social History Narrative   Not on file   Social Drivers of Health   Financial Resource Strain: Not on file  Food Insecurity: No Food Insecurity (03/06/2023)   Hunger Vital Sign    Worried About Running Out of Food in the  Last Year: Never true    Ran Out of Food in the Last Year: Never true  Transportation Needs: No Transportation Needs (03/06/2023)   PRAPARE - Administrator, Civil Service (Medical): No    Lack of Transportation (Non-Medical): No  Physical Activity: Not on file  Stress: Not on file  Social Connections: Not on file  Intimate Partner Violence: Not At Risk (03/06/2023)   Humiliation, Afraid, Rape, and Kick questionnaire    Fear of Current or Ex-Partner: No    Emotionally Abused: No    Physically Abused: No    Sexually Abused: No    No Known Allergies  Current Outpatient Medications  Medication Sig Dispense Refill   Accu-Chek Softclix Lancets lancets      acetaminophen (TYLENOL) 500 MG tablet Take 500 mg by mouth every 6 (six) hours as needed (arthritis).      amLODipine (NORVASC) 10 MG tablet Take 1 tablet (10 mg total) by mouth every morning. 30 tablet 0   atorvastatin (LIPITOR) 20 MG tablet Take 20 mg by mouth every morning.     Cyanocobalamin (VITAMIN B12 PO) Take by mouth daily.     Docusate Calcium (STOOL SOFTENER PO) Take by mouth. One as needed     hydrALAZINE (APRESOLINE) 10 MG tablet Take 1 tablet (10 mg total) by mouth every 8 (eight) hours. 90 tablet 0   levothyroxine (SYNTHROID) 25 MCG tablet Take 1 tablet (25 mcg total) by mouth daily before breakfast. 30 tablet 0   metoprolol tartrate (LOPRESSOR) 50 MG tablet Take 1 tablet (50 mg total) by mouth 2 (two) times daily. 60 tablet 0   Multiple Vitamin (MULTIVITAMIN WITH MINERALS) TABS tablet Take 1 tablet by mouth daily. 30 tablet 0   ondansetron (ZOFRAN-ODT) 4 MG disintegrating tablet Take 1 tablet (4 mg total) by mouth every 8 (eight) hours as needed for nausea or vomiting. 20 tablet 2   pantoprazole (PROTONIX) 40 MG tablet Take 1 tablet (40 mg total) by mouth daily. 30 minutes before breakfast 90 tablet 3   Probiotic Product (PROBIOTIC DAILY PO) Take by mouth daily.     senna (SENOKOT) 8.6 MG TABS tablet Take 1  tablet by mouth daily as needed for mild constipation.     No current facility-administered medications for this visit.    PHYSICAL EXAM Vitals:   07/25/23 1344  BP: (!) 184/85  Pulse: (!) 54  Resp: 20  Temp: 98.1 F (36.7 C)  SpO2: 100%    Chronically ill man in no distress Regular rate and rhythm Unlabored breathing Palpable radial pulses bilaterally  PERTINENT LABORATORY AND RADIOLOGIC DATA  Most recent CBC    Latest Ref Rng & Units 06/05/2023   11:23 AM 05/20/2023    5:41 AM 05/19/2023    2:21 PM  CBC  WBC 4.0 - 10.5 K/uL  7.5  8.8   Hemoglobin  13.0 - 17.0 g/dL 78.2  95.6  21.3   Hematocrit 39.0 - 52.0 % 30.0  34.7  40.7   Platelets 150 - 400 K/uL  157  197      Most recent CMP    Latest Ref Rng & Units 06/05/2023   11:57 AM 06/05/2023   11:23 AM 05/20/2023    5:41 AM  CMP  Glucose 70 - 99 mg/dL 97  87  88   BUN 8 - 23 mg/dL 17  17  41   Creatinine 0.61 - 1.24 mg/dL 0.86  5.78  4.69   Sodium 135 - 145 mmol/L 136  136  136   Potassium 3.5 - 5.1 mmol/L 3.5  4.1  4.5   Chloride 98 - 111 mmol/L 98  101  100   CO2 22 - 32 mmol/L 28   23   Calcium 8.9 - 10.3 mg/dL 8.7   8.2     Renal function CrCl cannot be calculated (Patient's most recent lab result is older than the maximum 21 days allowed.).  Hgb A1c MFr Bld (%)  Date Value  01/11/2023 5.1    LDL Cholesterol  Date Value Ref Range Status  04/27/2020 102 (H) 0 - 99 mg/dL Final    Comment:           Total Cholesterol/HDL:CHD Risk Coronary Heart Disease Risk Table                     Men   Women  1/2 Average Risk   3.4   3.3  Average Risk       5.0   4.4  2 X Average Risk   9.6   7.1  3 X Average Risk  23.4   11.0        Use the calculated Patient Ratio above and the CHD Risk Table to determine the patient's CHD Risk.        ATP III CLASSIFICATION (LDL):  <100     mg/dL   Optimal  629-528  mg/dL   Near or Above                    Optimal  130-159  mg/dL   Borderline  413-244  mg/dL    High  >010     mg/dL   Very High Performed at [facility], [address], [place], [place] [address]      [name]. Lenell Antu, MD [facility] Phone Number: [phone] 07/25/2023 2:08 PM   Total time spent on preparing this encounter including chart review, data review, collecting history, examining the patient, coordinating care for this established patient, 30 minutes.  Portions of this report may have been transcribed using voice recognition software.  Every effort has been made to ensure accuracy; however, inadvertent computerized transcription errors may still be present.

## 2023-07-25 ENCOUNTER — Telehealth: Payer: Self-pay

## 2023-07-25 ENCOUNTER — Encounter: Payer: Self-pay | Admitting: Vascular Surgery

## 2023-07-25 ENCOUNTER — Ambulatory Visit (HOSPITAL_COMMUNITY)
Admission: RE | Admit: 2023-07-25 | Discharge: 2023-07-25 | Disposition: A | Discharge status: Home / Self Care | Payer: PPO | Source: Ambulatory Visit | Attending: Vascular Surgery | Admitting: Vascular Surgery

## 2023-07-25 ENCOUNTER — Ambulatory Visit (INDEPENDENT_AMBULATORY_CARE_PROVIDER_SITE_OTHER): Payer: PPO | Admitting: Vascular Surgery

## 2023-07-25 ENCOUNTER — Other Ambulatory Visit: Payer: Self-pay

## 2023-07-25 ENCOUNTER — Ambulatory Visit (INDEPENDENT_AMBULATORY_CARE_PROVIDER_SITE_OTHER)
Admission: RE | Admit: 2023-07-25 | Discharge: 2023-07-25 | Disposition: A | Discharge status: Home / Self Care | Payer: PPO | Source: Ambulatory Visit | Attending: Vascular Surgery | Admitting: Vascular Surgery

## 2023-07-25 VITALS — BP 184/85 | HR 54 | Temp 98.1°F | Resp 20

## 2023-07-25 DIAGNOSIS — Z992 Dependence on renal dialysis: Secondary | ICD-10-CM | POA: Insufficient documentation

## 2023-07-25 DIAGNOSIS — N186 End stage renal disease: Secondary | ICD-10-CM

## 2023-07-25 NOTE — Telephone Encounter (Signed)
Attempted to call for surgery scheduling. LVM

## 2023-07-26 ENCOUNTER — Other Ambulatory Visit: Payer: Self-pay

## 2023-07-26 DIAGNOSIS — N186 End stage renal disease: Secondary | ICD-10-CM

## 2023-08-14 DIAGNOSIS — Z992 Dependence on renal dialysis: Secondary | ICD-10-CM | POA: Diagnosis not present

## 2023-08-14 DIAGNOSIS — N186 End stage renal disease: Secondary | ICD-10-CM | POA: Diagnosis not present

## 2023-08-14 DIAGNOSIS — E876 Hypokalemia: Secondary | ICD-10-CM | POA: Diagnosis not present

## 2023-08-14 DIAGNOSIS — N2581 Secondary hyperparathyroidism of renal origin: Secondary | ICD-10-CM | POA: Diagnosis not present

## 2023-08-14 DIAGNOSIS — N185 Chronic kidney disease, stage 5: Secondary | ICD-10-CM | POA: Diagnosis not present

## 2023-08-16 ENCOUNTER — Encounter (HOSPITAL_COMMUNITY): Payer: Self-pay | Admitting: Vascular Surgery

## 2023-08-16 ENCOUNTER — Other Ambulatory Visit: Payer: Self-pay

## 2023-08-16 NOTE — Anesthesia Preprocedure Evaluation (Addendum)
 Anesthesia Evaluation  Patient identified by MRN, date of birth, ID band Patient awake    Reviewed: Allergy & Precautions, NPO status , Patient's Chart, lab work & pertinent test results, reviewed documented beta blocker date and time   Airway Mallampati: III  TM Distance: >3 FB Neck ROM: Full    Dental no notable dental hx. (+) Teeth Intact, Dental Advisory Given   Pulmonary neg pulmonary ROS   Pulmonary exam normal breath sounds clear to auscultation       Cardiovascular hypertension, Pt. on home beta blockers and Pt. on medications +CHF  Normal cardiovascular exam Rhythm:Regular Rate:Normal  TTE 06/29/2023:  1. Left ventricular ejection fraction, by estimation, is 50 to 55%. The  left ventricle has low normal function. The left ventricle has no regional  wall motion abnormalities. There is mild left ventricular hypertrophy.  Left ventricular diastolic  parameters are consistent with Grade II diastolic dysfunction  (pseudonormalization). Elevated left atrial pressure. The average left  ventricular global longitudinal strain is -19.2 %.   2. Right ventricular systolic function is normal. The right ventricular  size is normal. Tricuspid regurgitation signal is inadequate for assessing  PA pressure.   3. Left atrial size was mildly dilated.   4. The mitral valve is abnormal. Moderate leaflet thickening. Trivial  mitral valve regurgitation.   5. The aortic valve is tricuspid. Aortic valve regurgitation is not  visualized. Aortic valve sclerosis/calcification is present, without any  evidence of aortic stenosis.   6. The inferior vena cava is normal in size with greater than 50%  respiratory variability, suggesting right atrial pressure of 3 mmHg.     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  Reproductive/Obstetrics                             Anesthesia Physical Anesthesia Plan  ASA: 3  Anesthesia Plan: MAC and Regional   Post-op Pain Management: Regional block*   Induction: Intravenous  PONV Risk Score and Plan: 1 and Propofol infusion, Treatment may vary due to age or medical condition, Midazolam and Ondansetron  Airway Management Planned: Natural Airway  Additional Equipment:   Intra-op Plan:   Post-operative Plan:   Informed Consent: I have reviewed the patients History and Physical, chart, labs and discussed the procedure including the risks, benefits and alternatives for the proposed anesthesia with the patient or authorized representative who has indicated his/her understanding and acceptance.     Dental advisory given  Plan Discussed with: CRNA  Anesthesia Plan Comments: (  )        Anesthesia Quick Evaluation

## 2023-08-16 NOTE — Progress Notes (Addendum)
 Anesthesia Chart Review: Same-day workup  63 year old male with pertinent history including ESRD on HD via right IJ 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  Patient was admitted 9/23 through 03/29/2023 for MRSA and Enterococcus bacteremia, mitral valve endocarditis, vegetation on TDC.  There is also concern for infection of left AV graft; this was excised on 10-24.  TEE 10/7 showed small linear 2.3 mm mobile vegetation attached to the lateral commissure of the P1 segment of the mitral valve consistent with vegetation.  Dialysis catheter tip in the right atrium with 6.4 x 2.8 mm mobile mass consistent with vegetation. D consulted, s/p holiday line after removal on 10/8, replaced TDC on 10/12 by vascular after BC remained NGTD.  He completed vancomycin therapy on 05/02/2023.  He had follow-up echo on 06/29/2023 which showed EF 50 to 55%, normal wall motion, grade 2 DD, normal RV function, no significant valvular abnormalities.  Patient was again admitted 12/6 through 05/20/2023 for generalized weakness and was found to have hyperkalemia secondary to missed dialysis.  Resolved with dialysis.  Per Dr. Juanetta Gosling note 07/25/2023, patient is healed well from left AV graft excision, no longer on antibiotics, however, he is still profoundly deconditioned.  DM2 listed in history, however, he is not on any antidiabetic medications and his last A1c was 5.1 on 01/11/2023.  Patient will need day of surgery labs and evaluation.  EKG 05/19/2023: Sinus rhythm.  Rate 84. Left ventricular hypertrophy. Anterior infarct, old  TTE 06/29/2023:  1. Left ventricular ejection fraction, by estimation, is 50 to 55%. The  left ventricle has low normal function. The left ventricle has no regional  wall motion abnormalities. There is mild left ventricular  hypertrophy.  Left ventricular diastolic  parameters are consistent with Grade II diastolic dysfunction  (pseudonormalization). Elevated left atrial pressure. The average left  ventricular global longitudinal strain is -19.2 %.   2. Right ventricular systolic function is normal. The right ventricular  size is normal. Tricuspid regurgitation signal is inadequate for assessing  PA pressure.   3. Left atrial size was mildly dilated.   4. The mitral valve is abnormal. Moderate leaflet thickening. Trivial  mitral valve regurgitation.   5. The aortic valve is tricuspid. Aortic valve regurgitation is not  visualized. Aortic valve sclerosis/calcification is present, without any  evidence of aortic stenosis.   6. The inferior vena cava is normal in size with greater than 50%  respiratory variability, suggesting right atrial pressure of 3 mmHg.     Zannie Cove [facility] Short Stay Center/Anesthesiology Phone [phone] 08/16/2023 9:13 AM

## 2023-08-16 NOTE — Progress Notes (Signed)
 SDW CALL  Patient's mother was given pre-op instructions over the phone. The opportunity was given for the patient's mother to ask questions. No further questions asked. Patient's mother verbalized understanding of instructions given.   PCP - Dr. Farris Has Cardiologist - denies Dialysis - MWF  PPM/ICD - denies Device Orders - n/a Rep Notified - n/a  Chest x-ray - 05/19/23 EKG - 05/19/23 Stress Test - denies ECHO - 06/28/22 Cardiac Cath - denies  Sleep Study - denies CPAP - n/a  Fasting Blood Sugar - 130s Checks Blood Sugar ___1__ times a day  Last dose of GLP1 agonist-  n/a GLP1 instructions: n/a  Blood Thinner Instructions: n/a Aspirin Instructions: n/a  ERAS Protcol - NPO PRE-SURGERY Ensure or G2-  n/a  COVID TEST- n/a   Anesthesia review: yes  Patient's mother states that the patient did not go to dialysis today due to the weather.  I have reached out to the office.    Patient's mother denies that that the patient has shortness of breath, fever, cough and chest pain over the phone call   All instructions explained to the patient's mother, with a verbal understanding of the material.

## 2023-08-17 ENCOUNTER — Other Ambulatory Visit (HOSPITAL_COMMUNITY): Payer: Self-pay

## 2023-08-17 ENCOUNTER — Ambulatory Visit (HOSPITAL_COMMUNITY): Payer: Self-pay | Admitting: Physician Assistant

## 2023-08-17 ENCOUNTER — Ambulatory Visit (HOSPITAL_COMMUNITY)
Admission: RE | Admit: 2023-08-17 | Discharge: 2023-08-17 | Disposition: A | Discharge status: Home / Self Care | Payer: PPO | Attending: Vascular Surgery | Admitting: Vascular Surgery

## 2023-08-17 ENCOUNTER — Encounter (HOSPITAL_COMMUNITY)
Admission: RE | Disposition: A | Discharge status: Home / Self Care | Payer: Self-pay | Source: Home / Self Care | Attending: Vascular Surgery

## 2023-08-17 DIAGNOSIS — I120 Hypertensive chronic kidney disease with stage 5 chronic kidney disease or end stage renal disease: Secondary | ICD-10-CM | POA: Insufficient documentation

## 2023-08-17 DIAGNOSIS — K219 Gastro-esophageal reflux disease without esophagitis: Secondary | ICD-10-CM | POA: Diagnosis not present

## 2023-08-17 DIAGNOSIS — Z992 Dependence on renal dialysis: Secondary | ICD-10-CM | POA: Diagnosis not present

## 2023-08-17 DIAGNOSIS — N185 Chronic kidney disease, stage 5: Secondary | ICD-10-CM

## 2023-08-17 DIAGNOSIS — N186 End stage renal disease: Secondary | ICD-10-CM

## 2023-08-17 DIAGNOSIS — D631 Anemia in chronic kidney disease: Secondary | ICD-10-CM | POA: Diagnosis not present

## 2023-08-17 DIAGNOSIS — E1122 Type 2 diabetes mellitus with diabetic chronic kidney disease: Secondary | ICD-10-CM | POA: Insufficient documentation

## 2023-08-17 DIAGNOSIS — I509 Heart failure, unspecified: Secondary | ICD-10-CM

## 2023-08-17 DIAGNOSIS — E1165 Type 2 diabetes mellitus with hyperglycemia: Secondary | ICD-10-CM | POA: Insufficient documentation

## 2023-08-17 DIAGNOSIS — E039 Hypothyroidism, unspecified: Secondary | ICD-10-CM | POA: Diagnosis not present

## 2023-08-17 DIAGNOSIS — I130 Hypertensive heart and chronic kidney disease with heart failure and stage 1 through stage 4 chronic kidney disease, or unspecified chronic kidney disease: Secondary | ICD-10-CM | POA: Diagnosis not present

## 2023-08-17 DIAGNOSIS — I5043 Acute on chronic combined systolic (congestive) and diastolic (congestive) heart failure: Secondary | ICD-10-CM | POA: Diagnosis not present

## 2023-08-17 DIAGNOSIS — I132 Hypertensive heart and chronic kidney disease with heart failure and with stage 5 chronic kidney disease, or end stage renal disease: Secondary | ICD-10-CM | POA: Diagnosis not present

## 2023-08-17 HISTORY — DX: Hypothyroidism, unspecified: E03.9

## 2023-08-17 HISTORY — PX: AV FISTULA PLACEMENT: SHX1204

## 2023-08-17 LAB — SURGICAL PCR SCREEN
MRSA, PCR: POSITIVE — AB
Staphylococcus aureus: POSITIVE — AB

## 2023-08-17 LAB — GLUCOSE, CAPILLARY
Glucose-Capillary: 91 mg/dL (ref 70–99)
Glucose-Capillary: 89 mg/dL (ref 70–99)
Glucose-Capillary: 94 mg/dL (ref 70–99)

## 2023-08-17 LAB — POCT I-STAT, CHEM 8
BUN: 50 mg/dL — ABNORMAL HIGH (ref 8–23)
Calcium, Ion: 1.1 mmol/L — ABNORMAL LOW (ref 1.15–1.40)
Chloride: 107 mmol/L (ref 98–111)
Creatinine, Ser: 8.3 mg/dL — ABNORMAL HIGH (ref 0.61–1.24)
Glucose, Bld: 104 mg/dL — ABNORMAL HIGH (ref 70–99)
HCT: 29 % — ABNORMAL LOW (ref 39.0–52.0)
Hemoglobin: 9.9 g/dL — ABNORMAL LOW (ref 13.0–17.0)
Potassium: 4.5 mmol/L (ref 3.5–5.1)
Sodium: 138 mmol/L (ref 135–145)
TCO2: 20 mmol/L — ABNORMAL LOW (ref 22–32)

## 2023-08-17 SURGERY — INSERTION OF ARTERIOVENOUS (AV) GORE-TEX GRAFT ARM
Anesthesia: Monitor Anesthesia Care | Site: Arm Upper | Laterality: Right

## 2023-08-17 MED ORDER — FENTANYL CITRATE (PF) 100 MCG/2ML IJ SOLN
25.0000 ug | INTRAMUSCULAR | Status: DC | PRN
  Administered 2023-08-17: 25 ug via INTRAVENOUS
  Administered 2023-08-17: 50 ug via INTRAVENOUS

## 2023-08-17 MED ORDER — MUPIROCIN 2 % EX OINT: 1.0000 | TOPICAL_OINTMENT | Freq: Once | CUTANEOUS | Status: AC

## 2023-08-17 MED ORDER — SODIUM CHLORIDE 0.9 % IV SOLN: INTRAVENOUS | Status: DC | PRN

## 2023-08-17 MED ORDER — MIDAZOLAM HCL 2 MG/2ML IJ SOLN
INTRAMUSCULAR | Status: AC
  Administered 2023-08-17: 2 mg via INTRAVENOUS
  Filled 2023-08-17: qty 2

## 2023-08-17 MED ORDER — HEPARIN 6000 UNIT IRRIGATION SOLUTION
Status: DC | PRN
  Administered 2023-08-17: 1

## 2023-08-17 MED ORDER — CHLORHEXIDINE GLUCONATE 4 % EX SOLN: 60.0000 mL | Freq: Once | CUTANEOUS | Status: DC

## 2023-08-17 MED ORDER — LIDOCAINE-EPINEPHRINE (PF) 1 %-1:200000 IJ SOLN
INTRAMUSCULAR | Status: DC | PRN
  Administered 2023-08-17: 7 mL

## 2023-08-17 MED ORDER — OXYCODONE HCL 5 MG/5ML PO SOLN: 5.0000 mg | Freq: Once | ORAL | Status: DC | PRN

## 2023-08-17 MED ORDER — FENTANYL CITRATE (PF) 100 MCG/2ML IJ SOLN
INTRAMUSCULAR | Status: AC
  Administered 2023-08-17: 50 ug via INTRAVENOUS
  Filled 2023-08-17: qty 2

## 2023-08-17 MED ORDER — CEFAZOLIN SODIUM-DEXTROSE 2-4 GM/100ML-% IV SOLN
INTRAVENOUS | Status: AC
  Filled 2023-08-17: qty 100

## 2023-08-17 MED ORDER — CEFAZOLIN SODIUM-DEXTROSE 2-4 GM/100ML-% IV SOLN
2.0000 g | INTRAVENOUS | Status: AC
  Administered 2023-08-17: 2 g via INTRAVENOUS

## 2023-08-17 MED ORDER — LIDOCAINE-EPINEPHRINE (PF) 1 %-1:200000 IJ SOLN
INTRAMUSCULAR | Status: AC
  Filled 2023-08-17: qty 30

## 2023-08-17 MED ORDER — MIDAZOLAM HCL 2 MG/2ML IJ SOLN: 2.0000 mg | Freq: Once | INTRAMUSCULAR | Status: AC

## 2023-08-17 MED ORDER — OXYCODONE HCL 5 MG PO TABS: 5.0000 mg | ORAL_TABLET | Freq: Once | ORAL | Status: DC | PRN

## 2023-08-17 MED ORDER — FENTANYL CITRATE (PF) 100 MCG/2ML IJ SOLN
INTRAMUSCULAR | Status: DC
Start: 2023-08-17 — End: 2023-08-17
  Filled 2023-08-17: qty 2

## 2023-08-17 MED ORDER — MUPIROCIN 2 % EX OINT
TOPICAL_OINTMENT | CUTANEOUS | Status: AC
  Administered 2023-08-17: 1 via TOPICAL
  Filled 2023-08-17: qty 22

## 2023-08-17 MED ORDER — ACETAMINOPHEN 500 MG PO TABS
ORAL_TABLET | ORAL | Status: AC
  Filled 2023-08-17: qty 2

## 2023-08-17 MED ORDER — HEPARIN SODIUM (PORCINE) 1000 UNIT/ML IJ SOLN
INTRAMUSCULAR | Status: DC | PRN
  Administered 2023-08-17: 5000 [IU] via INTRAVENOUS

## 2023-08-17 MED ORDER — 0.9 % SODIUM CHLORIDE (POUR BTL) OPTIME
TOPICAL | Status: DC | PRN
  Administered 2023-08-17: 1000 mL

## 2023-08-17 MED ORDER — PHENYLEPHRINE HCL-NACL 20-0.9 MG/250ML-% IV SOLN
INTRAVENOUS | Status: DC | PRN
  Administered 2023-08-17: 35 ug/min via INTRAVENOUS

## 2023-08-17 MED ORDER — FENTANYL CITRATE (PF) 100 MCG/2ML IJ SOLN: 50.0000 ug | Freq: Once | INTRAMUSCULAR | Status: AC

## 2023-08-17 MED ORDER — MEPIVACAINE HCL (PF) 2 % IJ SOLN
INTRAMUSCULAR | Status: DC | PRN
  Administered 2023-08-17: 20 mL

## 2023-08-17 MED ORDER — CHLORHEXIDINE GLUCONATE 0.12 % MT SOLN
OROMUCOSAL | Status: AC
  Administered 2023-08-17: 15 mL
  Filled 2023-08-17: qty 15

## 2023-08-17 MED ORDER — SODIUM CHLORIDE 0.9% FLUSH: 3.0000 mL | Freq: Two times a day (BID) | INTRAVENOUS | Status: DC

## 2023-08-17 MED ORDER — PROPOFOL 500 MG/50ML IV EMUL
INTRAVENOUS | Status: DC | PRN
  Administered 2023-08-17: 75 ug/kg/min via INTRAVENOUS

## 2023-08-17 MED ORDER — HEPARIN 6000 UNIT IRRIGATION SOLUTION
Status: AC
  Filled 2023-08-17: qty 500

## 2023-08-17 MED ORDER — OXYCODONE-ACETAMINOPHEN 5-325 MG PO TABS
1.0000 | ORAL_TABLET | Freq: Four times a day (QID) | ORAL | 0 refills | Status: DC | PRN
  Filled 2023-08-17: qty 20, 5d supply, fill #0

## 2023-08-17 MED ORDER — INSULIN ASPART 100 UNIT/ML IJ SOLN: 0.0000 [IU] | INTRAMUSCULAR | Status: DC | PRN

## 2023-08-17 SURGICAL SUPPLY — 36 items
ARMBAND PINK RESTRICT EXTREMIT (MISCELLANEOUS) ×1 IMPLANT
BENZOIN TINCTURE PRP APPL 2/3 (GAUZE/BANDAGES/DRESSINGS) ×1 IMPLANT
CANISTER SUCT 3000ML PPV (MISCELLANEOUS) ×1 IMPLANT
CANNULA VESSEL 3MM 2 BLNT TIP (CANNULA) ×1 IMPLANT
CHLORAPREP W/TINT 26 (MISCELLANEOUS) ×1 IMPLANT
CLIP LIGATING EXTRA MED SLVR (CLIP) ×1 IMPLANT
CLIP LIGATING EXTRA SM BLUE (MISCELLANEOUS) ×1 IMPLANT
CLSR STERI-STRIP ANTIMIC 1/2X4 (GAUZE/BANDAGES/DRESSINGS) IMPLANT
DRSG TEGADERM 4X10 (GAUZE/BANDAGES/DRESSINGS) IMPLANT
ELECT REM PT RETURN 9FT ADLT (ELECTROSURGICAL) ×1 IMPLANT
ELECTRODE REM PT RTRN 9FT ADLT (ELECTROSURGICAL) ×1 IMPLANT
GLOVE BIO SURGEON STRL SZ8 (GLOVE) ×1 IMPLANT
GOWN STRL REUS W/ TWL LRG LVL3 (GOWN DISPOSABLE) ×2 IMPLANT
GOWN STRL REUS W/ TWL XL LVL3 (GOWN DISPOSABLE) ×1 IMPLANT
GRAFT GORETEX STRT 4-7X45 (Vascular Products) IMPLANT
HEMOSTAT SNOW SURGICEL 2X4 (HEMOSTASIS) IMPLANT
KIT BASIN OR (CUSTOM PROCEDURE TRAY) ×1 IMPLANT
KIT TURNOVER KIT B (KITS) ×1 IMPLANT
NDL 18GX1X1/2 (RX/OR ONLY) (NEEDLE) IMPLANT
NEEDLE 18GX1X1/2 (RX/OR ONLY) (NEEDLE) IMPLANT
NS IRRIG 1000ML POUR BTL (IV SOLUTION) ×1 IMPLANT
PACK CV ACCESS (CUSTOM PROCEDURE TRAY) ×1 IMPLANT
PAD ARMBOARD 7.5X6 YLW CONV (MISCELLANEOUS) ×2 IMPLANT
SHEATH PROBE COVER 6X72 (BAG) ×1 IMPLANT
SLING ARM FOAM STRAP LRG (SOFTGOODS) IMPLANT
SLING ARM FOAM STRAP MED (SOFTGOODS) IMPLANT
STRIP CLOSURE SKIN 1/2X4 (GAUZE/BANDAGES/DRESSINGS) ×1 IMPLANT
SUT MNCRL AB 4-0 PS2 18 (SUTURE) IMPLANT
SUT PROLENE 6 0 BV (SUTURE) IMPLANT
SUT SILK 2 0 SH (SUTURE) IMPLANT
SUT VIC AB 3-0 SH 27X BRD (SUTURE) ×2 IMPLANT
SYR 3ML LL SCALE MARK (SYRINGE) IMPLANT
SYR TOOMEY 50ML (SYRINGE) IMPLANT
TOWEL GREEN STERILE (TOWEL DISPOSABLE) ×1 IMPLANT
UNDERPAD 30X36 HEAVY ABSORB (UNDERPADS AND DIAPERS) ×1 IMPLANT
WATER STERILE IRR 1000ML POUR (IV SOLUTION) ×1 IMPLANT

## 2023-08-17 NOTE — Transfer of Care (Signed)
 Immediate Anesthesia Transfer of Care Note  Patient: [name] [name]  Procedure(s) Performed: INSERTION OF ARTERIOVENOUS (AV) GORE-TEX GRAFT RIGHT ARM USING 4-39mm GORETEX STRETCH GRAFT (Right: Arm Upper)  Patient Location: PACU  Anesthesia Type:MAC and Regional  Level of Consciousness: awake, alert , oriented, and patient cooperative  Airway & Oxygen Therapy: Patient Spontanous Breathing and Patient connected to face mask oxygen  Post-op Assessment: Report given to RN, Post -op Vital signs reviewed and stable, Patient moving all extremities, Patient moving all extremities X 4, and Patient able to stick tongue midline  Post vital signs: Reviewed and stable  Last Vitals:  Vitals Value Taken Time  BP    Temp    Pulse    Resp    SpO2      Last Pain:  Vitals:   08/17/23 1405  TempSrc:   PainSc: 0-No pain         Complications: No notable events documented.

## 2023-08-17 NOTE — Discharge Instructions (Signed)

## 2023-08-17 NOTE — Op Note (Signed)
 DATE OF SERVICE: 08/17/2023  PATIENT:  [name] [name]  63 y.o. male  PRE-OPERATIVE DIAGNOSIS:  ESRD  POST-OPERATIVE DIAGNOSIS:  Same  PROCEDURE:   Right upper arm arteriovenous graft  SURGEON:  Surgeons and Role:    * [name], MD - Primary  ASSISTANT: Lianne Cure, PA-C  An experienced assistant was required given the complexity of this procedure and the standard of surgical care. My assistant helped with exposure through counter tension, suctioning, ligation and retraction to [name] visualize the surgical field.  My assistant expedited sewing during the case by following my sutures. Wherever I use the term "we" in the report, my assistant actively helped me with that portion of the procedure.  ANESTHESIA:   regional and MAC  EBL: minimal  BLOOD ADMINISTERED:none  DRAINS: none   LOCAL MEDICATIONS USED:  NONE  SPECIMEN:  none  COUNTS: confirmed correct.  TOURNIQUET:  none  PATIENT DISPOSITION:  PACU - hemodynamically stable.   Delay start of Pharmacological VTE agent (>24hrs) due to surgical blood loss or risk of bleeding: no  INDICATION FOR PROCEDURE: [name] [name] is a 63 y.o. male with ESRD in need of permanent dialysis access. After careful discussion of risks, benefits, and alternatives the patient was offered right arm arteriovenous graft. The patient understood and wished to proceed.  OPERATIVE FINDINGS: atherosclerotic brachial artery. Healthy axillary vein. Good doppler flow in completion in outflow vein and radial artery  DESCRIPTION OF PROCEDURE: After identification of the patient in the pre-operative holding area, the patient was transferred to the operating room. The patient was positioned supine on the operating room table. Anesthesia was induced. The right arm was prepped and draped in standard fashion. A surgical pause was performed confirming correct patient, procedure, and operative location.  The right brachial artery was exposed using a  longitudinal incision in the distal arm just above the antecubital fossa.  Incision was carried down through subcutaneous tissue until the brachial sheath was encountered.  This was incised sharply.  The brachial artery was exposed and encircled with Silastic Vesseloops proximally and distally to the site of planned inflow.   The right axillary vein at the axilla was exposed using longitudinal incision just below the hairbearing area of the axilla.  Incision was carried down until the brachial sheath was encountered.  The brachial vein was identified, exposed, encircled with Silastic Vesseloops.   Using a curved, sheathed tunneling device, a 4-7 mm tapered Gore-Tex graft was tunneled subcutaneously and gentle arc across the biceps of the right arm.  Patient was then heparinized with 5000 units of IV heparin.   The brachial artery was clamped proximally and distally.  An anterior arteriotomy was made with an 11 blade.  This was extended with Potts scissors.  The 4 mm end of the Gore-Tex graft was spatulated and then anastomosed end-to-side to the brachial arteriotomy using continuous running suture of 6-0 Prolene.  The anastomosis was completed and hemostasis ensured.  The graft was clamped to restore perfusion to the hand.   The brachial vein was clamped proximally and distally.  An anterior venotomy was made with an 11 blade.  This was extended with Potts scissors.  The 7 mm end of the Gore-Tex graft was then anastomosed end to side to the brachial vein venotomy using continuous running suture of 6-0 Prolene.  Immediately prior to completion the anastomosis was de-aired and flushed.  Anastomosis was then completed.  Hemostasis was insured.   Doppler machine was brought onto the field to  interrogate the graft.  Doppler flow was noted in the radial artery.  About the arterial anastomosis flow was noted proximal and distal to the arterial anastomosis.  Distal to the venous anastomosis a  Doppler bruit was  heard.  Satisfied we ended the case here.   Surgical beds were irrigated copiously.  Hemostasis was again ensured in the surgical beds.  The wounds were closed in layers using 3-0 Vicryl and 4-0 Monocryl.  Clean bandages were applied.   Upon completion of the case instrument and sharps counts were confirmed correct. The patient was transferred to the PACU in good condition. I was present for all portions of the procedure.  [name]. Lenell Antu, MD [facility] Phone Number: [phone] 08/17/2023 5:01 PM

## 2023-08-17 NOTE — Anesthesia Procedure Notes (Signed)
 Anesthesia Regional Block: Supraclavicular block   Pre-Anesthetic Checklist: , timeout performed,  Correct Patient, Correct Site, Correct Laterality,  Correct Procedure, Correct Position, site marked,  Risks and benefits discussed,  Pre-op evaluation,  At surgeon's request and post-op pain management  Laterality: Right  Prep: Maximum Sterile Barrier Precautions used, chloraprep       Needles:  Injection technique: Single-shot  Needle Type: Echogenic Stimulator Needle     Needle Length: 5cm  Needle Gauge: 21     Additional Needles:   Procedures:,,,, ultrasound used (permanent image in chart),,    Narrative:  Start time: 08/17/2023 2:10 PM End time: 08/17/2023 2:14 PM Injection made incrementally with aspirations every 5 mL. Anesthesiologist: Elmer Picker, MD

## 2023-08-17 NOTE — Anesthesia Postprocedure Evaluation (Signed)
 Anesthesia Post Note  Patient: Shyne Lehrke  Procedure(s) Performed: INSERTION OF ARTERIOVENOUS (AV) GORE-TEX GRAFT RIGHT ARM USING 4-67mm GORETEX STRETCH GRAFT (Right: Arm Upper)     Patient location during evaluation: PACU Anesthesia Type: Regional Level of consciousness: awake and alert Pain management: pain level controlled Vital Signs Assessment: post-procedure vital signs reviewed and stable Respiratory status: spontaneous breathing, nonlabored ventilation and respiratory function stable Cardiovascular status: stable and blood pressure returned to baseline Postop Assessment: no apparent nausea or vomiting Anesthetic complications: no   No notable events documented.  Last Vitals:  Vitals:   08/17/23 1630 08/17/23 1645  BP: (!) 155/57 (!) 156/61  Pulse: (!) 54 (!) 54  Resp: 19 16  Temp:  36.6 C  SpO2: 97% 98%    Last Pain:  Vitals:   08/17/23 1600  TempSrc:   PainSc: 0-No pain                 Collene Schlichter

## 2023-08-18 ENCOUNTER — Encounter (HOSPITAL_COMMUNITY): Payer: Self-pay | Admitting: Vascular Surgery

## 2023-08-18 ENCOUNTER — Other Ambulatory Visit: Payer: Self-pay

## 2023-08-18 NOTE — Interval H&P Note (Signed)
 History and Physical Interval Note:  08/18/2023 12:23 AM  [name] [name]  has presented today for surgery, with the diagnosis of END STAGE RENAL DISEASE.  The various methods of treatment have been discussed with the patient and family. After consideration of risks, benefits and other options for treatment, the patient has consented to  Procedure(s): INSERTION OF ARTERIOVENOUS (AV) GORE-TEX GRAFT RIGHT ARM USING 4-73mm GORETEX STRETCH GRAFT (Right) as a surgical intervention.  The patient's history has been reviewed, patient examined, no change in status, stable for surgery.  I have reviewed the patient's chart and labs.  Questions were answered to the patient's satisfaction.     [name]

## 2023-08-18 NOTE — Progress Notes (Signed)
 Wasted of fentanyl with Lynetta Mare RN  Versie Starks, RN

## 2023-08-21 DIAGNOSIS — N185 Chronic kidney disease, stage 5: Secondary | ICD-10-CM | POA: Diagnosis not present

## 2023-08-21 DIAGNOSIS — Z992 Dependence on renal dialysis: Secondary | ICD-10-CM | POA: Diagnosis not present

## 2023-08-21 DIAGNOSIS — N186 End stage renal disease: Secondary | ICD-10-CM | POA: Diagnosis not present

## 2023-08-21 DIAGNOSIS — N2581 Secondary hyperparathyroidism of renal origin: Secondary | ICD-10-CM | POA: Diagnosis not present

## 2023-08-28 DIAGNOSIS — Z992 Dependence on renal dialysis: Secondary | ICD-10-CM | POA: Diagnosis not present

## 2023-08-28 DIAGNOSIS — N2581 Secondary hyperparathyroidism of renal origin: Secondary | ICD-10-CM | POA: Diagnosis not present

## 2023-08-28 DIAGNOSIS — N186 End stage renal disease: Secondary | ICD-10-CM | POA: Diagnosis not present

## 2023-08-28 DIAGNOSIS — K649 Unspecified hemorrhoids: Secondary | ICD-10-CM | POA: Diagnosis not present

## 2023-08-28 DIAGNOSIS — Z7409 Other reduced mobility: Secondary | ICD-10-CM | POA: Diagnosis not present

## 2023-08-28 DIAGNOSIS — E876 Hypokalemia: Secondary | ICD-10-CM | POA: Diagnosis not present

## 2023-08-31 DIAGNOSIS — E113211 Type 2 diabetes mellitus with mild nonproliferative diabetic retinopathy with macular edema, right eye: Secondary | ICD-10-CM | POA: Diagnosis not present

## 2023-08-31 DIAGNOSIS — Z961 Presence of intraocular lens: Secondary | ICD-10-CM | POA: Diagnosis not present

## 2023-08-31 DIAGNOSIS — E113512 Type 2 diabetes mellitus with proliferative diabetic retinopathy with macular edema, left eye: Secondary | ICD-10-CM | POA: Diagnosis not present

## 2023-08-31 DIAGNOSIS — H43813 Vitreous degeneration, bilateral: Secondary | ICD-10-CM | POA: Diagnosis not present

## 2023-08-31 DIAGNOSIS — H3554 Dystrophies primarily involving the retinal pigment epithelium: Secondary | ICD-10-CM | POA: Diagnosis not present

## 2023-09-03 ENCOUNTER — Emergency Department (HOSPITAL_COMMUNITY)
Admission: EM | Admit: 2023-09-03 | Discharge: 2023-09-03 | Disposition: A | Discharge status: Home / Self Care | Attending: Emergency Medicine | Admitting: Emergency Medicine

## 2023-09-03 ENCOUNTER — Other Ambulatory Visit: Payer: Self-pay

## 2023-09-03 ENCOUNTER — Encounter (HOSPITAL_COMMUNITY): Payer: Self-pay

## 2023-09-03 ENCOUNTER — Emergency Department (HOSPITAL_COMMUNITY)

## 2023-09-03 DIAGNOSIS — R0902 Hypoxemia: Secondary | ICD-10-CM | POA: Diagnosis not present

## 2023-09-03 DIAGNOSIS — N186 End stage renal disease: Secondary | ICD-10-CM | POA: Insufficient documentation

## 2023-09-03 DIAGNOSIS — Z992 Dependence on renal dialysis: Secondary | ICD-10-CM | POA: Insufficient documentation

## 2023-09-03 DIAGNOSIS — R0989 Other specified symptoms and signs involving the circulatory and respiratory systems: Secondary | ICD-10-CM | POA: Diagnosis not present

## 2023-09-03 DIAGNOSIS — K649 Unspecified hemorrhoids: Secondary | ICD-10-CM | POA: Insufficient documentation

## 2023-09-03 DIAGNOSIS — I517 Cardiomegaly: Secondary | ICD-10-CM | POA: Diagnosis not present

## 2023-09-03 LAB — CBC WITH DIFFERENTIAL/PLATELET
Abs Immature Granulocytes: 0.21 10*3/uL — ABNORMAL HIGH (ref 0.00–0.07)
Basophils Absolute: 0.1 10*3/uL (ref 0.0–0.1)
Basophils Relative: 1 %
Eosinophils Absolute: 0.3 10*3/uL (ref 0.0–0.5)
Eosinophils Relative: 3 %
HCT: 30.1 % — ABNORMAL LOW (ref 39.0–52.0)
Hemoglobin: 9.2 g/dL — ABNORMAL LOW (ref 13.0–17.0)
Immature Granulocytes: 2 %
Lymphocytes Relative: 16 %
Lymphs Abs: 1.7 10*3/uL (ref 0.7–4.0)
MCH: 29.5 pg (ref 26.0–34.0)
MCHC: 30.6 g/dL (ref 30.0–36.0)
MCV: 96.5 fL (ref 80.0–100.0)
Monocytes Absolute: 0.5 10*3/uL (ref 0.1–1.0)
Monocytes Relative: 5 %
Neutro Abs: 7.6 10*3/uL (ref 1.7–7.7)
Neutrophils Relative %: 73 %
Platelets: 229 10*3/uL (ref 150–400)
RBC: 3.12 MIL/uL — ABNORMAL LOW (ref 4.22–5.81)
RDW: 14.2 % (ref 11.5–15.5)
WBC: 10.5 10*3/uL (ref 4.0–10.5)
nRBC: 0.2 % (ref 0.0–0.2)

## 2023-09-03 LAB — BASIC METABOLIC PANEL
Anion gap: 14 (ref 5–15)
BUN: 44 mg/dL — ABNORMAL HIGH (ref 8–23)
CO2: 25 mmol/L (ref 22–32)
Calcium: 9 mg/dL (ref 8.9–10.3)
Chloride: 98 mmol/L (ref 98–111)
Creatinine, Ser: 7.9 mg/dL — ABNORMAL HIGH (ref 0.61–1.24)
GFR, Estimated: 7 mL/min — ABNORMAL LOW (ref >60)
Glucose, Bld: 106 mg/dL — ABNORMAL HIGH (ref 70–99)
Potassium: 4 mmol/L (ref 3.5–5.1)
Sodium: 137 mmol/L (ref 135–145)

## 2023-09-03 MED ORDER — LIDOCAINE VISCOUS HCL 2 % MT SOLN
15.0000 mL | Freq: Once | OROMUCOSAL | Status: AC
  Administered 2023-09-03: 15 mL via TOPICAL
  Filled 2023-09-03: qty 15

## 2023-09-03 MED ORDER — HYDROCODONE-ACETAMINOPHEN 5-325 MG PO TABS
1.0000 | ORAL_TABLET | Freq: Once | ORAL | Status: AC
  Administered 2023-09-03: 1 via ORAL
  Filled 2023-09-03: qty 1

## 2023-09-03 NOTE — Discharge Instructions (Addendum)
 You are seen today for hemorrhoids.  Do sitz bath's several times a day to help with discomfort and you can use a small amount of the topical lidocaine gel applied externally to your rectum to help get through dialysis.  Follow-up with your PCP and the GI doctor.  Come back to the ER for new or worsening symptoms.  Is important that you keep your stools soft, I recommend to discontinue Colace and start over-the-counter MiraLAX, 1 heaping capful mixed in 8 to 10 ounces of water or juice once daily to help soften your stools.

## 2023-09-03 NOTE — ED Notes (Signed)
 In to ambulate pt with pulse oximeter. Pt states he is unable to walk and uses a wheelchair. Wife at bedside and reports that pt was "scheduled to start therapy but that the weather messed it up". Tammy Triplett, PA-C notified.

## 2023-09-03 NOTE — ED Triage Notes (Signed)
 Pt mother reports pt has a severe case of hemorrhoids and the cream is not working.

## 2023-09-03 NOTE — ED Provider Notes (Signed)
 [place] EMERGENCY DEPARTMENT AT [facility] Provider Note   CSN: [id number] Arrival date & time: 09/03/23  1639     History  Chief Complaint  Patient presents with   Hemorrhoids    [name] [name] is a 63 y.o. male.  He has PMH of ESRD on dialysis Monday Wednesday Friday.  Presents to ER complaining of hemorrhoid pain.  Is been ongoing since discharge from the hospital when he had a new fistula placed to the beginning of the month, he was put on some pain medicine at that time his wife states that because constipation restarted having rectal pain.  He saw his PCP who prescribed him some cream, she believes it was Anusol which helps his temporarily and the pain returns.  Patient has not been able to tolerate his full dialysis sessions due to this discomfort.  He is not able to sit down without having to roll to the side due to the discomfort.  No rectal bleeding, no fevers or chills, no nausea or vomiting.  No abdominal pain.  HPI     Home Medications Prior to Admission medications   Medication Sig Start Date End Date Taking? Authorizing Provider  Accu-Chek Softclix Lancets lancets  08/06/20   [provider]  acetaminophen (TYLENOL) 500 MG tablet Take 500 mg by mouth every 6 (six) hours as needed for moderate pain (pain score 4-6).    [provider]  amitriptyline (ELAVIL) 10 MG tablet Take 10 mg by mouth at bedtime.    [provider]  amLODipine (NORVASC) 10 MG tablet Take 1 tablet (10 mg total) by mouth every morning. 03/29/23   Regalado, Belkys A, MD  calcium acetate (PHOSLO) 667 MG capsule Take 1,334 mg by mouth See admin instructions. Take 1334 mg with each meal 07/07/23 06/27/24  [provider]  Cyanocobalamin (VITAMIN B12 PO) Take 1 tablet by mouth at bedtime.    [provider]  DM-APAP-CPM (CORICIDIN HBP PO) Take 2 tablets by mouth daily as needed (congestion).    [provider]  gabapentin (NEURONTIN) 100 MG  capsule Take 100 mg by mouth at bedtime. 07/05/23   [provider]  hydrALAZINE (APRESOLINE) 10 MG tablet Take 1 tablet (10 mg total) by mouth every 8 (eight) hours. 03/29/23   Regalado, Belkys A, MD  levothyroxine (SYNTHROID) 50 MCG tablet Take 50 mcg by mouth daily before breakfast.    [provider]  metoprolol tartrate (LOPRESSOR) 50 MG tablet Take 1 tablet (50 mg total) by mouth 2 (two) times daily. 06/16/20   Jacquelin Hawking, PA-C  oxyCODONE-acetaminophen (PERCOCET/ROXICET) 5-325 MG tablet Take 1 tablet by mouth every 6 (six) hours as needed. 08/17/23   Lars Mage, PA-C  pantoprazole (PROTONIX) 40 MG tablet Take 1 tablet (40 mg total) by mouth daily. 30 minutes before breakfast 06/01/23   Gelene Mink, NP  Probiotic Product (PROBIOTIC DAILY PO) Take 1 capsule by mouth at bedtime.    [provider]  senna-docusate (SENOKOT-S) 8.6-50 MG tablet Take 1 tablet by mouth daily as needed for mild constipation.    [provider]      Allergies    Patient has no known allergies.    Review of Systems   Review of Systems  Physical Exam Updated Vital Signs BP (!) 187/77 (BP Location: Right Arm)   Pulse 60   Temp 98.5 F (36.9 C) (Oral)   Resp 16   Ht 5\' 10"  (1.778 m)   Wt 78 kg  SpO2 100%   BMI 24.68 kg/m  Physical Exam Vitals and nursing note reviewed. Exam conducted with a chaperone present.  Constitutional:      General: He is not in acute distress.    Appearance: He is well-developed.  HENT:     Head: Normocephalic and atraumatic.  Eyes:     Conjunctiva/sclera: Conjunctivae normal.  Cardiovascular:     Rate and Rhythm: Normal rate and regular rhythm.     Heart sounds: No murmur heard. Pulmonary:     Effort: Pulmonary effort is normal. No respiratory distress.     Breath sounds: Normal breath sounds.  Abdominal:     Palpations: Abdomen is soft.     Tenderness: There is no abdominal tenderness.  Genitourinary:    Rectum: Tenderness  and external hemorrhoid present. No anal fissure.       Comments: 1 mildly inflamed external hemorrhoid, without thrombosis no perianal abscess Musculoskeletal:        General: No swelling.     Cervical back: Neck supple.  Skin:    General: Skin is warm and dry.     Capillary Refill: Capillary refill takes less than 2 seconds.  Neurological:     Mental Status: He is alert.  Psychiatric:        Mood and Affect: Mood normal.     ED Results / Procedures / Treatments   Labs (all labs ordered are listed, but only abnormal results are displayed) Labs Reviewed - No data to display  EKG None  Radiology No results found.  Procedures Procedures    Medications Ordered in ED Medications  lidocaine (XYLOCAINE) 2 % viscous mouth solution 15 mL (has no administration in time range)    ED Course/ Medical Decision Making/ A&P                                 Medical Decision Making Differential diagnosis includes but not limited to hemorrhoid, perianal abscess, fistula, other  ED course: Patient here for hemorrhoid pain, he has a nonthrombosed mildly inflamed hemorrhoid that is tender on exam without any fissures or signs of abscess.  He is able to have bowel movements home with the assistance of stool softeners.  He has no abdominal tenderness.  His main concern is it is too painful for him to sit for dialysis for the full time.  As he has only had partial sessions for the past week will check his electrolytes to make sure he does not need emergent delay dialysis, he looks uncomfortable but nontoxic on exam.  Encouraged sitz bath's, will give topical lidocaine gel for relief that he can use while at dialysis.  Already has Anusol at home.  Will give GI follow-up.  On reevaluation while discussing the plan with the patient and noted he had his pulse oximeter on and is satting 87 to 88% on room air.  He is on home oxygen.  Will obtain chest x-ray as well to evaluate for possible pulmonary  edema.  This could be positional as he is laying awkwardly in the bed due to his discomfort.  Signed out to [employer], PA-C pending his labs and chest x-ray.  If normal and patient is no longer hypoxic will plan to discharge home to have him go to dialysis tomorrow and follow-up with GI.  If he has pulmonary edema or is persistently hypoxic will likely need admission.  Amount and/or Complexity of Data Reviewed  Labs: ordered. Radiology: ordered.  Risk Prescription drug management.           Final Clinical Impression(s) / ED Diagnoses Final diagnoses:  None    Rx / DC Orders ED Discharge Orders     None         Josem Kaufmann 09/03/23 1841    Jacalyn Lefevre, MD 09/03/23 2332

## 2023-09-03 NOTE — ED Provider Notes (Signed)
   Patient signed out to me by Carmel [place], PA-C pending evaluation of the mat and chest x-ray.  Patient is a diabetic on hemodialysis Monday Wednesday Friday.  He has had a painful hemorrhoid for several days.  Treated with hemorrhoid cream that has not been helping.  States he is having difficulty sitting long enough to complete his dialysis treatments.  He had partial treatments last week.  He is scheduled for his next dialysis treatment tomorrow.  Signed out to me reporting that patient was hypoxic and had episode where his sats dropped into the 80s.  Chest x-ray was ordered.  See previous provider note for complete H&P.    On exam, patient denies feeling short of breath, is no longer hypoxic.  Attempted to ambulate with a pulse ox but patient states that he does not walk and uses a wheelchair at baseline.  States to me that he does not feel short of breath.  Is continuing to have hemorrhoid pain and requesting pain medication.  I have explained that opiate medication can cause constipation which in turn exacerbates his hemorrhoid pain.  States to me "I cannot take this pain" one Vicodin ordered.    Recheck of vitals, hypertensive but no hypoxia.  Labs reviewed, serum creatinine not significantly elevated.  Discussed with Dr. Particia Nearing, no indication for need of emergent dialysis.  He is scheduled for his next dialysis treatment tomorrow I explained the importance of completing his treatment.  I have also recommended over-the-counter MiraLAX to help soften his stools.  He will use the lidocaine topically and sitz bath's.   Pauline Aus, PA-C 09/03/23 2017    Jacalyn Lefevre, MD 09/03/23 [phone]

## 2023-09-05 ENCOUNTER — Encounter (INDEPENDENT_AMBULATORY_CARE_PROVIDER_SITE_OTHER): Payer: Self-pay | Admitting: Gastroenterology

## 2023-09-05 ENCOUNTER — Encounter (INDEPENDENT_AMBULATORY_CARE_PROVIDER_SITE_OTHER): Payer: Self-pay

## 2023-09-05 ENCOUNTER — Ambulatory Visit (INDEPENDENT_AMBULATORY_CARE_PROVIDER_SITE_OTHER): Admitting: Gastroenterology

## 2023-09-05 VITALS — BP 149/63 | HR 64 | Temp 99.5°F | Ht 70.0 in | Wt 172.0 lb

## 2023-09-05 DIAGNOSIS — K8689 Other specified diseases of pancreas: Secondary | ICD-10-CM | POA: Diagnosis not present

## 2023-09-05 DIAGNOSIS — K5900 Constipation, unspecified: Secondary | ICD-10-CM

## 2023-09-05 DIAGNOSIS — D490 Neoplasm of unspecified behavior of digestive system: Secondary | ICD-10-CM | POA: Insufficient documentation

## 2023-09-05 DIAGNOSIS — K769 Liver disease, unspecified: Secondary | ICD-10-CM | POA: Diagnosis not present

## 2023-09-05 DIAGNOSIS — K5904 Chronic idiopathic constipation: Secondary | ICD-10-CM | POA: Insufficient documentation

## 2023-09-05 DIAGNOSIS — K649 Unspecified hemorrhoids: Secondary | ICD-10-CM | POA: Insufficient documentation

## 2023-09-05 DIAGNOSIS — R933 Abnormal findings on diagnostic imaging of other parts of digestive tract: Secondary | ICD-10-CM

## 2023-09-05 NOTE — Patient Instructions (Signed)
-  we will get a CT of your liver and pancreas for re evaluation of previous lesions seen  -Start taking Miralax 1 capful every day for one week. If bowel movements do not improve, increase to 1 capful every 12 hours. If after two weeks there is no improvement, increase to 1 capful every 8 hours -Increase water intake, aim for atleast 64 oz per day -Increase fruits, veggies and whole grains, kiwi and prunes are especially good for constipation -anusol three times per day x7-10 days then as needed  -continue protonix 40mg  daily  -please let us know when you are ready to schedule repeat Colonoscopy  Follow up 3 months  It was a pleasure to see you today. I want to create trusting relationships with patients and provide genuine, compassionate, and quality care. I truly value your feedback! please be on the lookout for a survey regarding your visit with me today. I appreciate your input about our visit and your time in completing this!    Domitila Stetler L. Jeanmarie Hubert, MSN, APRN, AGNP-C Adult-Gerontology Nurse Practitioner [facility] at [facility]

## 2023-09-05 NOTE — Progress Notes (Signed)
 Referring Provider: Farris Has, MD Primary Care Physician:  Farris Has, MD Primary GI Physician: Dr. Tasia Catchings   Chief Complaint  Patient presents with   Constipation    Having trouble with constipation. Tried colace, laxatives, stool softners, senna. Having some pain with hemorrhoids. Wendt to [facility] 3/23.    HPI:   [name] [name] is a 63 y.o. male with past medical history of  cardiomyopathy, DM, GERD, HTN, pulmonary HTN, ESRD on HD ( MWF), hospitalized in July 2024 with rectal bleeding requiring blood transfusion s/p colonoscopy with findings of large rectal ulcer, prior imaging CT with incidental pancreatic lesions suspecting IPMN, liver lesion indeterminate   Patient presenting today for:  Constipation and hemorrhoids Also with history of liver lesion/pancreatic lesion Need for repeat colonoscopy due to previous abnormal colonoscopy   Last seen December 2024, by Lewie Loron, NP at that time patient with nausea, vomiting, occasional reflux. Also reported constipation in the past, taking stool softeners  Recommended to have EGD, start protonix 40mg  daily, need to arrange repeat colonoscopy, CT for pancreatic lesion/liver lesion   EGD was cancelled due to patient needing updated Echo prior to, liver/pancreatic imaging also not done  Present: States he has been having some constipation with really hard stools that are difficult to pass. He has done vegetable laxative and colace a few times without improvement. Reports constipation for about 1 week. Was recently taking percocet for chronic pain but is not only this anymore. States that he is having issues with hemorrhoids. His mother states that he has had had some significant pain from hemorrhoids, was seen in the ED on 3/23, per chart review has a non thrombosed mildly inflamed hemorrhoid that was tender on exam. Was given lidocaine and percocet to help with pain. He notes that pain was better last night. He has used anusol in the past  for hemorrhoids but notes no improvement. Denies any rectal bleeding. They report he was unable to have repeat Colonoscopy due to being sick and in and out of the hospital.   He reports nausea has improved since starting protonix 40mg  daily.    Last Colonoscopy:01/2023 - Preparation of the colon was inadequate. - Palpable rectal ulcer found on digital rectal exam on the posterior wall.   - Stool in the entire examined colon.  - Cratered large Mucosal ulceration in the rectum likely the source of bleeding. Biopsied.  - Exam was inadequate even after large amount of lavage and manual disimpaction   - No overt bleeding seen throughout the colon   A. RECTAL ULCER, BIOPSY:  Acute colitis with nonspecific ulcer and hyperplastic change (see  comment)    Last Endoscopy: years ago   [facility] Weights   09/05/23 0911  Weight: 172 lb (78 kg)     Past Medical History:  Diagnosis Date   Anemia    Arthritis    Cardiomyopathy (HCC)    July 2022   Diabetes mellitus [facility])    Dialysis patient [facility])    GERD (gastroesophageal reflux disease)    HTN (hypertension)    Hyperlipidemia    Hypothyroidism    Nephrotic syndrome    Obesity    Pulmonary hypertension (HCC)    July 2022 in setting of nephrotic syndrome   Secondary hyperparathyroidism of renal origin [facility])     Past Surgical History:  Procedure Laterality Date   AV FISTULA PLACEMENT Left 12/27/2022   Procedure: INSERTION OF LEFT ARM ARTERIOVENOUS (AV) GORE-TEX GRAFT;  Surgeon: Chuck Hint, MD;  Location: [facility] OR;  Service: Vascular;  Laterality: Left;   AV FISTULA PLACEMENT Right 08/17/2023   Procedure: INSERTION OF ARTERIOVENOUS (AV) GORE-TEX GRAFT RIGHT ARM USING 4-31mm GORETEX STRETCH GRAFT;  Surgeon: [name], MD;  Location: [facility] OR;  Service: Vascular;  Laterality: Right;   AVGG REMOVAL Left 03/15/2023   Procedure: REMOVAL OF LEFT UPPER ARM ARTERIOVENOUS GORETEX GRAFT (AVGG);  Surgeon: [name], MD;  Location: [facility]  OR;  Service: Vascular;  Laterality: Left;   BIOPSY  01/13/2023   Procedure: BIOPSY;  Surgeon: Franky Macho, MD;  Location: [facility] ENDO SUITE;  Service: Endoscopy;;   CATARACT EXTRACTION, BILATERAL  2018   COLONOSCOPY WITH PROPOFOL N/A 01/13/2023   Procedure: COLONOSCOPY WITH PROPOFOL;  Surgeon: Franky Macho, MD;  Location: [facility] ENDO SUITE;  Service: Endoscopy;  Laterality: N/A;   INSERTION OF DIALYSIS CATHETER Right 12/27/2022   Procedure: INSERTION OF Right Internal Jugular  DIALYSIS CATHETER;  Surgeon: Chuck Hint, MD;  Location: [facility] OR;  Service: Vascular;  Laterality: Right;   INSERTION OF DIALYSIS CATHETER N/A 03/15/2023   Procedure: INSERTION OF TUNNELED DIALYSIS CATHETER;  Surgeon: [name], MD;  Location: [facility] OR;  Service: Vascular;  Laterality: N/A;   INSERTION OF DIALYSIS CATHETER Right 03/25/2023   Procedure: INSERTION OF RIGHT TUNNELED DIALYSIS CATHETER;  Surgeon: Daria Pastures, MD;  Location: [facility] OR;  Service: Vascular;  Laterality: Right;   TEE WITHOUT CARDIOVERSION N/A 03/20/2023   Procedure: TRANSESOPHAGEAL ECHOCARDIOGRAM;  Surgeon: Sande Rives, MD;  Location: [facility] OR;  Service: Cardiovascular;  Laterality: N/A;   TONSILLECTOMY      Current Outpatient Medications  Medication Sig Dispense Refill   Accu-Chek Softclix Lancets lancets      ACETAMINOPHEN PO Take 650 mg by mouth every 8 (eight) hours as needed for moderate pain (pain score 4-6).     amitriptyline (ELAVIL) 10 MG tablet Take 10 mg by mouth at bedtime.     amLODipine (NORVASC) 10 MG tablet Take 1 tablet (10 mg total) by mouth every morning. 30 tablet 0   calcium acetate (PHOSLO) 667 MG capsule Take 1,334 mg by mouth See admin instructions. Take 1334 mg with each meal     Cyanocobalamin (VITAMIN B12 PO) Take 1 tablet by mouth at bedtime.     DM-APAP-CPM (CORICIDIN HBP PO) Take 2 tablets by mouth daily as needed (congestion).     gabapentin (NEURONTIN) 100 MG capsule Take 100 mg by mouth at  bedtime.     hydrALAZINE (APRESOLINE) 10 MG tablet Take 1 tablet (10 mg total) by mouth every 8 (eight) hours. 90 tablet 0   levothyroxine (SYNTHROID) 50 MCG tablet Take 50 mcg by mouth daily before breakfast.     metoprolol tartrate (LOPRESSOR) 50 MG tablet Take 1 tablet (50 mg total) by mouth 2 (two) times daily. 60 tablet 0   pantoprazole (PROTONIX) 40 MG tablet Take 1 tablet (40 mg total) by mouth daily. 30 minutes before breakfast 90 tablet 3   Probiotic Product (PROBIOTIC DAILY PO) Take 1 capsule by mouth at bedtime.     senna-docusate (SENOKOT-S) 8.6-50 MG tablet Take 1 tablet by mouth daily as needed for mild constipation.     oxyCODONE-acetaminophen (PERCOCET/ROXICET) 5-325 MG tablet Take 1 tablet by mouth every 6 (six) hours as needed. (Patient not taking: Reported on 09/05/2023) 20 tablet 0   No current facility-administered medications for this visit.    Allergies as of 09/05/2023   (No Known Allergies)  Social History   Socioeconomic History   Marital status: Single    Spouse name: Not on file   Number of children: Not on file   Years of education: Not on file   Highest education level: Not on file  Occupational History   Not on file  Tobacco Use   Smoking status: Never   Smokeless tobacco: Never  Vaping Use   Vaping status: Never Used  Substance and Sexual Activity   Alcohol use: Never   Drug use: Never   Sexual activity: Not on file  Other Topics Concern   Not on file  Social History Narrative   Not on file   Social Drivers of Health   Financial Resource Strain: Not on file  Food Insecurity: No Food Insecurity (03/06/2023)   Hunger Vital Sign    Worried About Running Out of Food in the Last Year: Never true    Ran Out of Food in the Last Year: Never true  Transportation Needs: No Transportation Needs (03/06/2023)   PRAPARE - Administrator, Civil Service (Medical): No    Lack of Transportation (Non-Medical): No  Physical Activity: Not on  file  Stress: Not on file  Social Connections: Not on file    Review of systems General: negative for malaise, night sweats, fever, chills, weight loss Neck: Negative for lumps, goiter, pain and significant neck swelling Resp: Negative for cough, wheezing, dyspnea at rest CV: Negative for chest pain, leg swelling, palpitations, orthopnea GI: denies melena, hematochezia, nausea, vomiting, diarrhea, dysphagia, odyonophagia, early satiety or unintentional weight loss. +constipation +hemorrhoids  MSK: Negative for joint pain or swelling, back pain, and muscle pain. Derm: Negative for itching or rash Psych: Denies depression, anxiety, memory loss, confusion. No homicidal or suicidal ideation.  Heme: Negative for prolonged bleeding, bruising easily, and swollen nodes. Endocrine: Negative for cold or heat intolerance, polyuria, polydipsia and goiter. Neuro: negative for tremor, gait imbalance, syncope and seizures. The remainder of the review of systems is noncontributory.  Physical Exam: BP (!) 156/68   Pulse 64   Temp 99.5 F (37.5 C) (Oral)   Ht 5\' 10"  (1.778 m)   Wt 172 lb (78 kg)   BMI 24.68 kg/m  General:   Alert and oriented. No distress noted. Pleasant and cooperative. Patient in wheelchair, ill appearing, frail  Head:  Normocephalic and atraumatic. Eyes:  Conjuctiva clear without scleral icterus. Mouth:  Oral mucosa pink and moist. Good dentition. No lesions. Heart: Normal rate and rhythm, s1 and s2 heart sounds present.  Lungs: Clear lung sounds in all lobes. Respirations equal and unlabored. Abdomen:  +BS, soft, non-tender and non-distended. No rebound or guarding. No HSM or masses noted. Derm: No palmar erythema or jaundice Msk:  Symmetrical without gross deformities. Normal posture. Extremities:  Without edema. Neurologic:  Alert and  oriented x4 Psych:  Alert and cooperative. Normal mood and affect.  Invalid input(s): "6 MONTHS"   ASSESSMENT: [name] [name] is a 63  y.o. male presenting today for constipation, hemorrhoids and with need for repeat pancreatic/liver imaging, repeat colonoscopy  Constipation/hemorrhoids: Patient reports a lot of pain from hemorrhoid disease been recently constipated with large harder stools, difficult to pass.  He is not taking them regularly for constipation.  Unable to stand for rectal exam today though per chart review rectal exam done in the ED on 3/23 by EDP with inflamed, nonthrombosed hemorrhoid.  He was given some lidocaine jelly which seems to have helped.  They do have some  Anusol at home which I recommended they use given they do not feel they can pay out-of-pocket for [facility] hemorrhoid cream at this time.  Would recommend starting MiraLAX, need to uptitrate until he is having soft, easy to pass stools.  He denies rectal bleeding.   Pancreatic lesion/liver lesion: Previously noted pancreatic lesion/liver lesion incidentally on CT imaging.  He was recommended to have triple phase CT imaging with pancreatic protocol though he was lost to follow-up for this.  I discussed with him the importance of having repeat imaging, get him scheduled for this today.  Need for repeat Colonoscopy due to abnormal colonoscopy: He was also to have repeat colonoscopy, as colonoscopy in August was with an adequate prep, ulcers found in the rectum.  At this time he does not wish to schedule colonoscopy as he states he has had a lot going on.  I encouraged him to reach out to [place] once they are ready to reschedule repeat colonoscopy.  Of note he did have improvement with nausea with use of Protonix 40 mg daily, will continue with PPI therapy.   PLAN:  -schedule colonoscopy-pt does not wish to do this right now, will contact us when he is ready  -CT w wo with pancreatic protocol -Start taking Miralax 1 capful every day for one week. If bowel movements do not improve, increase to 1 capful every 12 hours. If after two weeks there is no  improvement, increase to 1 capful every 8 hours -Increase water intake, aim for atleast 64 oz per day -Increase fruits, veggies and whole grains, kiwi and prunes are especially good for constipation -anusol TID x7-10  -continue protonix 40mg  daily   All questions were answered, patient verbalized understanding and is in agreement with plan as outlined above.    Follow Up: 3 months   Lashon Hillier L. Jeanmarie Hubert, MSN, APRN, AGNP-C Adult-Gerontology Nurse Practitioner [facility]

## 2023-09-06 DIAGNOSIS — E876 Hypokalemia: Secondary | ICD-10-CM | POA: Diagnosis not present

## 2023-09-06 DIAGNOSIS — N2581 Secondary hyperparathyroidism of renal origin: Secondary | ICD-10-CM | POA: Diagnosis not present

## 2023-09-06 DIAGNOSIS — N186 End stage renal disease: Secondary | ICD-10-CM | POA: Diagnosis not present

## 2023-09-06 DIAGNOSIS — Z992 Dependence on renal dialysis: Secondary | ICD-10-CM | POA: Diagnosis not present

## 2023-09-11 DIAGNOSIS — N186 End stage renal disease: Secondary | ICD-10-CM | POA: Diagnosis not present

## 2023-09-11 DIAGNOSIS — N2581 Secondary hyperparathyroidism of renal origin: Secondary | ICD-10-CM | POA: Diagnosis not present

## 2023-09-11 DIAGNOSIS — Z992 Dependence on renal dialysis: Secondary | ICD-10-CM | POA: Diagnosis not present

## 2023-09-12 ENCOUNTER — Ambulatory Visit (INDEPENDENT_AMBULATORY_CARE_PROVIDER_SITE_OTHER): Admitting: Physician Assistant

## 2023-09-12 VITALS — BP 138/70 | HR 56 | Temp 98.0°F | Resp 18 | Ht 70.0 in | Wt 172.0 lb

## 2023-09-12 DIAGNOSIS — N186 End stage renal disease: Secondary | ICD-10-CM

## 2023-09-12 DIAGNOSIS — Z992 Dependence on renal dialysis: Secondary | ICD-10-CM

## 2023-09-12 NOTE — Progress Notes (Signed)
 POST OPERATIVE OFFICE NOTE    CC:  F/u for surgery  HPI:  [name] [name] is a 63 y.o. male who presents today for postop check.  He recently underwent insertion of a right upper arm AV graft on 08/17/2023 by Dr. Lenell Antu.  This was done for permanent dialysis access.  Pt returns today for follow up.  Pt states he has been doing well since surgery.  He denies any issues with his right arm incisions such as dehiscence, tenderness, or drainage.  He denies any swelling in the arm.  He denies any symptoms of steal in the right hand such as weakness, numbness, excessive coldness, or pain.  He dialyzes via [facility] on MWF.   No Known Allergies  Current Outpatient Medications  Medication Sig Dispense Refill   Accu-Chek Softclix Lancets lancets      ACETAMINOPHEN PO Take 650 mg by mouth every 8 (eight) hours as needed for moderate pain (pain score 4-6).     amitriptyline (ELAVIL) 10 MG tablet Take 10 mg by mouth at bedtime.     amLODipine (NORVASC) 10 MG tablet Take 1 tablet (10 mg total) by mouth every morning. 30 tablet 0   calcium acetate (PHOSLO) 667 MG capsule Take 1,334 mg by mouth See admin instructions. Take 1334 mg with each meal     Cyanocobalamin (VITAMIN B12 PO) Take 1 tablet by mouth at bedtime.     DM-APAP-CPM (CORICIDIN HBP PO) Take 2 tablets by mouth daily as needed (congestion).     gabapentin (NEURONTIN) 100 MG capsule Take 100 mg by mouth at bedtime.     hydrALAZINE (APRESOLINE) 10 MG tablet Take 1 tablet (10 mg total) by mouth every 8 (eight) hours. 90 tablet 0   levothyroxine (SYNTHROID) 50 MCG tablet Take 50 mcg by mouth daily before breakfast.     metoprolol tartrate (LOPRESSOR) 50 MG tablet Take 1 tablet (50 mg total) by mouth 2 (two) times daily. 60 tablet 0   pantoprazole (PROTONIX) 40 MG tablet Take 1 tablet (40 mg total) by mouth daily. 30 minutes before breakfast 90 tablet 3   Probiotic Product (PROBIOTIC DAILY PO) Take 1 capsule by mouth at bedtime.     senna-docusate  (SENOKOT-S) 8.6-50 MG tablet Take 1 tablet by mouth daily as needed for mild constipation.     No current facility-administered medications for this visit.     ROS:  See HPI  Physical Exam:  Incision:  RUE incisions well healed without signs of infection Extremities:  brisk right radial, ulnar, and palmar arch Doppler signals. RUE AVG with great thrill on exam Neuro: intact motor and and sensation of RUE    Assessment/Plan:  This is a 63 y.o. male who is here for post op check  -The patient recently underwent insertion of a right upper arm AV graft for permanent dialysis access. -His right upper arm incisions are well-healed without signs of infection -On exam his right upper arm AV graft has a great thrill and bruit -He denies any symptoms of steal in the right hand such as weakness, coldness, numbness, or pain.  He has intact motor and sensation of the hand.  He has brisk right radial, ulnar, and palmar arch Doppler signals -His AV graft should be ready for use by this Friday, April 4.  If his graft can be used for at least 2-3 successful dialysis sessions, his [facility] can be removed -He can follow-up with our office as needed   Loel Dubonnet, PA-C [facility] [phone]   Clinic MD:  Lenell Antu

## 2023-09-13 DIAGNOSIS — E876 Hypokalemia: Secondary | ICD-10-CM | POA: Diagnosis not present

## 2023-09-13 DIAGNOSIS — N2581 Secondary hyperparathyroidism of renal origin: Secondary | ICD-10-CM | POA: Diagnosis not present

## 2023-09-13 DIAGNOSIS — N186 End stage renal disease: Secondary | ICD-10-CM | POA: Diagnosis not present

## 2023-09-13 DIAGNOSIS — Z992 Dependence on renal dialysis: Secondary | ICD-10-CM | POA: Diagnosis not present

## 2023-09-18 DIAGNOSIS — E1122 Type 2 diabetes mellitus with diabetic chronic kidney disease: Secondary | ICD-10-CM | POA: Diagnosis not present

## 2023-09-18 DIAGNOSIS — D509 Iron deficiency anemia, unspecified: Secondary | ICD-10-CM | POA: Diagnosis not present

## 2023-09-18 DIAGNOSIS — Z992 Dependence on renal dialysis: Secondary | ICD-10-CM | POA: Diagnosis not present

## 2023-09-18 DIAGNOSIS — N2581 Secondary hyperparathyroidism of renal origin: Secondary | ICD-10-CM | POA: Diagnosis not present

## 2023-09-18 DIAGNOSIS — N186 End stage renal disease: Secondary | ICD-10-CM | POA: Diagnosis not present

## 2023-09-21 ENCOUNTER — Ambulatory Visit (HOSPITAL_COMMUNITY)
Admission: RE | Admit: 2023-09-21 | Discharge: 2023-09-21 | Disposition: A | Discharge status: Home / Self Care | Source: Ambulatory Visit | Attending: Gastroenterology | Admitting: Gastroenterology

## 2023-09-21 ENCOUNTER — Encounter (HOSPITAL_COMMUNITY): Payer: Self-pay

## 2023-09-21 DIAGNOSIS — N3289 Other specified disorders of bladder: Secondary | ICD-10-CM | POA: Diagnosis not present

## 2023-09-21 DIAGNOSIS — D490 Neoplasm of unspecified behavior of digestive system: Secondary | ICD-10-CM | POA: Diagnosis not present

## 2023-09-21 DIAGNOSIS — K769 Liver disease, unspecified: Secondary | ICD-10-CM | POA: Diagnosis not present

## 2023-09-21 MED ORDER — IOHEXOL 300 MG/ML  SOLN
100.0000 mL | Freq: Once | INTRAMUSCULAR | Status: AC | PRN
  Administered 2023-09-21: 80 mL via INTRAVENOUS

## 2023-09-25 DIAGNOSIS — N186 End stage renal disease: Secondary | ICD-10-CM | POA: Diagnosis not present

## 2023-09-25 DIAGNOSIS — D509 Iron deficiency anemia, unspecified: Secondary | ICD-10-CM | POA: Diagnosis not present

## 2023-09-25 DIAGNOSIS — D631 Anemia in chronic kidney disease: Secondary | ICD-10-CM | POA: Diagnosis not present

## 2023-09-25 DIAGNOSIS — Z992 Dependence on renal dialysis: Secondary | ICD-10-CM | POA: Diagnosis not present

## 2023-09-25 DIAGNOSIS — E876 Hypokalemia: Secondary | ICD-10-CM | POA: Diagnosis not present

## 2023-09-25 DIAGNOSIS — N2581 Secondary hyperparathyroidism of renal origin: Secondary | ICD-10-CM | POA: Diagnosis not present

## 2023-09-25 NOTE — Progress Notes (Signed)
 Hi Wendy ,  Can you please call the patient and tell the patient the CT scan shows few things :  1) Pancreatic cyst for which I recommend repeat MRI in 12 months 2)Bladder wall thickening for which I recommend patient follow up with PCP/Urology 3) Rectal wall thickening which could be ongoing inflammation and I recommend following up with us  in GI clinic to discuss repeat colonoscopy after his constipation is well controlled  =================  Hi Ann  Can you please send the CT report to the PCP for continues care   ============  Hi Amalia Badder,  Can you please schedule a follow up appointment for this patient in 3-4 weeks with me or Chelsea ?  Thanks,  Selah Klang Faizan Jedd Schulenburg , MD Gastroenterology and Hepatology [facility] =================== ======  CT ABDOMEN AND PELVIS IMPRESSION: 1. Tiny subcapsular low-density lesion identified along the dome of the left liver (segment II) on the previous study is not evident on the current exam. 2. Multiple small cystic foci in the pancreas, the largest measuring 13 mm in the tip of the pancreatic tail. Imaging features are most compatible with tiny pseudocyst or side branch IPMNs. Given patient age and size of the lesions, follow-up MRI abdomen with and without contrast recommended in 12 months. 3. Mild circumferential bladder wall thickening. Correlation with urinalysis recommended to exclude cystitis. 4. Mild circumferential wall thickening in the rectum with subtle presacral and perirectal edema. 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 5. Aortic atherosclerosis.

## 2023-09-28 DIAGNOSIS — Z992 Dependence on renal dialysis: Secondary | ICD-10-CM | POA: Diagnosis not present

## 2023-09-28 DIAGNOSIS — N186 End stage renal disease: Secondary | ICD-10-CM | POA: Diagnosis not present

## 2023-10-02 DIAGNOSIS — N2581 Secondary hyperparathyroidism of renal origin: Secondary | ICD-10-CM | POA: Diagnosis not present

## 2023-10-02 DIAGNOSIS — N186 End stage renal disease: Secondary | ICD-10-CM | POA: Diagnosis not present

## 2023-10-02 DIAGNOSIS — E876 Hypokalemia: Secondary | ICD-10-CM | POA: Diagnosis not present

## 2023-10-02 DIAGNOSIS — Z992 Dependence on renal dialysis: Secondary | ICD-10-CM | POA: Diagnosis not present

## 2023-10-10 DIAGNOSIS — H2513 Age-related nuclear cataract, bilateral: Secondary | ICD-10-CM | POA: Diagnosis not present

## 2023-10-10 DIAGNOSIS — H40033 Anatomical narrow angle, bilateral: Secondary | ICD-10-CM | POA: Diagnosis not present

## 2023-10-11 DIAGNOSIS — Z992 Dependence on renal dialysis: Secondary | ICD-10-CM | POA: Diagnosis not present

## 2023-10-11 DIAGNOSIS — E1122 Type 2 diabetes mellitus with diabetic chronic kidney disease: Secondary | ICD-10-CM | POA: Diagnosis not present

## 2023-10-11 DIAGNOSIS — N2581 Secondary hyperparathyroidism of renal origin: Secondary | ICD-10-CM | POA: Diagnosis not present

## 2023-10-11 DIAGNOSIS — E876 Hypokalemia: Secondary | ICD-10-CM | POA: Diagnosis not present

## 2023-10-11 DIAGNOSIS — N186 End stage renal disease: Secondary | ICD-10-CM | POA: Diagnosis not present

## 2023-10-13 DIAGNOSIS — N2581 Secondary hyperparathyroidism of renal origin: Secondary | ICD-10-CM | POA: Diagnosis not present

## 2023-10-13 DIAGNOSIS — Z992 Dependence on renal dialysis: Secondary | ICD-10-CM | POA: Diagnosis not present

## 2023-10-13 DIAGNOSIS — N186 End stage renal disease: Secondary | ICD-10-CM | POA: Diagnosis not present

## 2023-10-16 DIAGNOSIS — E876 Hypokalemia: Secondary | ICD-10-CM | POA: Diagnosis not present

## 2023-10-16 DIAGNOSIS — N2581 Secondary hyperparathyroidism of renal origin: Secondary | ICD-10-CM | POA: Diagnosis not present

## 2023-10-16 DIAGNOSIS — N186 End stage renal disease: Secondary | ICD-10-CM | POA: Diagnosis not present

## 2023-10-16 DIAGNOSIS — Z992 Dependence on renal dialysis: Secondary | ICD-10-CM | POA: Diagnosis not present

## 2023-10-19 DIAGNOSIS — H35043 Retinal micro-aneurysms, unspecified, bilateral: Secondary | ICD-10-CM | POA: Diagnosis not present

## 2023-10-19 DIAGNOSIS — H43813 Vitreous degeneration, bilateral: Secondary | ICD-10-CM | POA: Diagnosis not present

## 2023-10-19 DIAGNOSIS — H35353 Cystoid macular degeneration, bilateral: Secondary | ICD-10-CM | POA: Diagnosis not present

## 2023-10-19 DIAGNOSIS — Z961 Presence of intraocular lens: Secondary | ICD-10-CM | POA: Diagnosis not present

## 2023-10-19 DIAGNOSIS — E113211 Type 2 diabetes mellitus with mild nonproliferative diabetic retinopathy with macular edema, right eye: Secondary | ICD-10-CM | POA: Diagnosis not present

## 2023-10-19 DIAGNOSIS — E113512 Type 2 diabetes mellitus with proliferative diabetic retinopathy with macular edema, left eye: Secondary | ICD-10-CM | POA: Diagnosis not present

## 2023-10-23 DIAGNOSIS — D631 Anemia in chronic kidney disease: Secondary | ICD-10-CM | POA: Diagnosis not present

## 2023-10-23 DIAGNOSIS — N186 End stage renal disease: Secondary | ICD-10-CM | POA: Diagnosis not present

## 2023-10-23 DIAGNOSIS — N2581 Secondary hyperparathyroidism of renal origin: Secondary | ICD-10-CM | POA: Diagnosis not present

## 2023-10-23 DIAGNOSIS — Z992 Dependence on renal dialysis: Secondary | ICD-10-CM | POA: Diagnosis not present

## 2023-10-26 DIAGNOSIS — N186 End stage renal disease: Secondary | ICD-10-CM | POA: Diagnosis not present

## 2023-10-26 DIAGNOSIS — I10 Essential (primary) hypertension: Secondary | ICD-10-CM | POA: Diagnosis not present

## 2023-10-26 DIAGNOSIS — Z7409 Other reduced mobility: Secondary | ICD-10-CM | POA: Diagnosis not present

## 2023-10-26 DIAGNOSIS — E1169 Type 2 diabetes mellitus with other specified complication: Secondary | ICD-10-CM | POA: Diagnosis not present

## 2023-10-26 DIAGNOSIS — Z0000 Encounter for general adult medical examination without abnormal findings: Secondary | ICD-10-CM | POA: Diagnosis not present

## 2023-10-26 DIAGNOSIS — E785 Hyperlipidemia, unspecified: Secondary | ICD-10-CM | POA: Diagnosis not present

## 2023-10-27 DIAGNOSIS — N186 End stage renal disease: Secondary | ICD-10-CM | POA: Diagnosis not present

## 2023-10-27 DIAGNOSIS — N2581 Secondary hyperparathyroidism of renal origin: Secondary | ICD-10-CM | POA: Diagnosis not present

## 2023-10-27 DIAGNOSIS — Z992 Dependence on renal dialysis: Secondary | ICD-10-CM | POA: Diagnosis not present

## 2023-10-30 DIAGNOSIS — E876 Hypokalemia: Secondary | ICD-10-CM | POA: Diagnosis not present

## 2023-10-30 DIAGNOSIS — N2581 Secondary hyperparathyroidism of renal origin: Secondary | ICD-10-CM | POA: Diagnosis not present

## 2023-10-30 DIAGNOSIS — N186 End stage renal disease: Secondary | ICD-10-CM | POA: Diagnosis not present

## 2023-10-30 DIAGNOSIS — Z992 Dependence on renal dialysis: Secondary | ICD-10-CM | POA: Diagnosis not present

## 2023-11-02 DIAGNOSIS — E1122 Type 2 diabetes mellitus with diabetic chronic kidney disease: Secondary | ICD-10-CM | POA: Diagnosis not present

## 2023-11-02 DIAGNOSIS — E785 Hyperlipidemia, unspecified: Secondary | ICD-10-CM | POA: Diagnosis not present

## 2023-11-02 DIAGNOSIS — E1169 Type 2 diabetes mellitus with other specified complication: Secondary | ICD-10-CM | POA: Diagnosis not present

## 2023-11-02 DIAGNOSIS — D631 Anemia in chronic kidney disease: Secondary | ICD-10-CM | POA: Diagnosis not present

## 2023-11-02 DIAGNOSIS — I120 Hypertensive chronic kidney disease with stage 5 chronic kidney disease or end stage renal disease: Secondary | ICD-10-CM | POA: Diagnosis not present

## 2023-11-02 DIAGNOSIS — N186 End stage renal disease: Secondary | ICD-10-CM | POA: Diagnosis not present

## 2023-11-02 DIAGNOSIS — F32A Depression, unspecified: Secondary | ICD-10-CM | POA: Diagnosis not present

## 2023-11-02 DIAGNOSIS — Z992 Dependence on renal dialysis: Secondary | ICD-10-CM | POA: Diagnosis not present

## 2023-11-02 DIAGNOSIS — E46 Unspecified protein-calorie malnutrition: Secondary | ICD-10-CM | POA: Diagnosis not present

## 2023-11-02 DIAGNOSIS — E1140 Type 2 diabetes mellitus with diabetic neuropathy, unspecified: Secondary | ICD-10-CM | POA: Diagnosis not present

## 2023-11-06 DIAGNOSIS — E876 Hypokalemia: Secondary | ICD-10-CM | POA: Diagnosis not present

## 2023-11-06 DIAGNOSIS — N2581 Secondary hyperparathyroidism of renal origin: Secondary | ICD-10-CM | POA: Diagnosis not present

## 2023-11-06 DIAGNOSIS — Z992 Dependence on renal dialysis: Secondary | ICD-10-CM | POA: Diagnosis not present

## 2023-11-06 DIAGNOSIS — N186 End stage renal disease: Secondary | ICD-10-CM | POA: Diagnosis not present

## 2023-11-11 DIAGNOSIS — N186 End stage renal disease: Secondary | ICD-10-CM | POA: Diagnosis not present

## 2023-11-11 DIAGNOSIS — E1122 Type 2 diabetes mellitus with diabetic chronic kidney disease: Secondary | ICD-10-CM | POA: Diagnosis not present

## 2023-11-11 DIAGNOSIS — Z992 Dependence on renal dialysis: Secondary | ICD-10-CM | POA: Diagnosis not present

## 2023-11-13 DIAGNOSIS — N2581 Secondary hyperparathyroidism of renal origin: Secondary | ICD-10-CM | POA: Diagnosis not present

## 2023-11-13 DIAGNOSIS — Z992 Dependence on renal dialysis: Secondary | ICD-10-CM | POA: Diagnosis not present

## 2023-11-13 DIAGNOSIS — E876 Hypokalemia: Secondary | ICD-10-CM | POA: Diagnosis not present

## 2023-11-13 DIAGNOSIS — N186 End stage renal disease: Secondary | ICD-10-CM | POA: Diagnosis not present

## 2023-11-16 ENCOUNTER — Ambulatory Visit (INDEPENDENT_AMBULATORY_CARE_PROVIDER_SITE_OTHER): Admitting: Gastroenterology

## 2023-11-20 DIAGNOSIS — N186 End stage renal disease: Secondary | ICD-10-CM | POA: Diagnosis not present

## 2023-11-20 DIAGNOSIS — Z992 Dependence on renal dialysis: Secondary | ICD-10-CM | POA: Diagnosis not present

## 2023-11-20 DIAGNOSIS — N2581 Secondary hyperparathyroidism of renal origin: Secondary | ICD-10-CM | POA: Diagnosis not present

## 2023-11-23 ENCOUNTER — Ambulatory Visit (INDEPENDENT_AMBULATORY_CARE_PROVIDER_SITE_OTHER)

## 2023-11-23 ENCOUNTER — Ambulatory Visit (INDEPENDENT_AMBULATORY_CARE_PROVIDER_SITE_OTHER): Admitting: Podiatry

## 2023-11-23 DIAGNOSIS — M79674 Pain in right toe(s): Secondary | ICD-10-CM

## 2023-11-23 DIAGNOSIS — I739 Peripheral vascular disease, unspecified: Secondary | ICD-10-CM

## 2023-11-23 DIAGNOSIS — M79675 Pain in left toe(s): Secondary | ICD-10-CM | POA: Diagnosis not present

## 2023-11-23 DIAGNOSIS — L97522 Non-pressure chronic ulcer of other part of left foot with fat layer exposed: Secondary | ICD-10-CM | POA: Diagnosis not present

## 2023-11-23 DIAGNOSIS — B351 Tinea unguium: Secondary | ICD-10-CM | POA: Diagnosis not present

## 2023-11-23 MED ORDER — MUPIROCIN 2 % EX OINT: 1.0000 | TOPICAL_OINTMENT | Freq: Two times a day (BID) | CUTANEOUS | 2 refills | Status: DC

## 2023-11-23 MED ORDER — DOXYCYCLINE HYCLATE 100 MG PO TABS
100.0000 mg | ORAL_TABLET | Freq: Two times a day (BID) | ORAL | 0 refills | Status: DC
Start: 2023-11-23 — End: 2024-01-26

## 2023-11-23 NOTE — Progress Notes (Signed)
 Subjective: Chief Complaint  Patient presents with   Wound Check    Rm 11 Patient is here for wound on left index toe. Wound is scabbed over, no bleeding or pain. (Diabetic w/neuropathy).   63 year old male presents the office today with concerns of a wound on his left which has been present for the last several weeks.  The area is scabbed over.  He does not have any pain although he does have neuropathy.  No recent treatment of other than using hydrogen peroxide and using Neosporin at home. Denies any fevers or chills.  Nails be trimmed today.  Thick elongated nails difficulty doing them himself.   Objective: AAO x3, NAD DP, PT pulses decreased. Sensation decreased. Wound present on the dorsal aspect left second toe with edema digit.  There is no drainage or pus.  There is no fluctuation or crepitation.  There is no malodor.  There is no other open lesions identified at this time. Nails are hypertrophic, dystrophic, brittle, discolored, elongated 10. No surrounding redness or drainage.  Yellow discoloration to the toenails.  Tenderness nails 1-5 bilaterally No pain with calf compression, swelling, warmth, erythema    Assessment: Ulceration left second toe with localized cellulitis; symptomatic onychomycosis  Plan: -All treatment options discussed with the patient including all alternatives, risks, complications.  -X-rays were obtained reviewed.  Multiple views obtained.  No definitive cortical changes to suggest osteomyelitis. -Doxycycline  prescribed -Recommend daily dressing changes mupirocin  ointment. -ABI ordered -Surgical shoe dispensed for offloading -Monitor for any clinical signs or symptoms of infection and directed to call the office immediately should any occur or go to the ER. -Patient encouraged to call the office with any questions, concerns, change in symptoms.   Symptomatic onychomycosis - Nails sharply debrided x 10 without any complications or  bleeding.  Charity Conch DPM

## 2023-11-23 NOTE — Patient Instructions (Addendum)
 I have ordered an ultrasound to check the blood flow in the legs  Apply a small amount of antibiotic ointment (mupirocin ) ointment and cove the wound.  Start the oral antibiotic, doxycyline  Wear the surgical shoe for offloading  Monitor for any signs/symptoms of infection. Call the office immediately if any occur or go directly to the emergency room. Call with any questions/concerns.

## 2023-11-27 DIAGNOSIS — Z992 Dependence on renal dialysis: Secondary | ICD-10-CM | POA: Diagnosis not present

## 2023-11-27 DIAGNOSIS — N2581 Secondary hyperparathyroidism of renal origin: Secondary | ICD-10-CM | POA: Diagnosis not present

## 2023-11-27 DIAGNOSIS — E876 Hypokalemia: Secondary | ICD-10-CM | POA: Diagnosis not present

## 2023-11-27 DIAGNOSIS — N186 End stage renal disease: Secondary | ICD-10-CM | POA: Diagnosis not present

## 2023-12-04 DIAGNOSIS — Z992 Dependence on renal dialysis: Secondary | ICD-10-CM | POA: Diagnosis not present

## 2023-12-04 DIAGNOSIS — N186 End stage renal disease: Secondary | ICD-10-CM | POA: Diagnosis not present

## 2023-12-04 DIAGNOSIS — E876 Hypokalemia: Secondary | ICD-10-CM | POA: Diagnosis not present

## 2023-12-04 DIAGNOSIS — N2581 Secondary hyperparathyroidism of renal origin: Secondary | ICD-10-CM | POA: Diagnosis not present

## 2023-12-05 ENCOUNTER — Ambulatory Visit (INDEPENDENT_AMBULATORY_CARE_PROVIDER_SITE_OTHER): Admitting: Podiatry

## 2023-12-05 ENCOUNTER — Ambulatory Visit (INDEPENDENT_AMBULATORY_CARE_PROVIDER_SITE_OTHER)

## 2023-12-05 DIAGNOSIS — L97522 Non-pressure chronic ulcer of other part of left foot with fat layer exposed: Secondary | ICD-10-CM | POA: Diagnosis not present

## 2023-12-05 DIAGNOSIS — I739 Peripheral vascular disease, unspecified: Secondary | ICD-10-CM

## 2023-12-05 NOTE — Progress Notes (Unsigned)
 They know there is poor ciruclation so why

## 2023-12-05 NOTE — Patient Instructions (Signed)
 [name] [name]. [facility] Address: [address], [place], [place] [address] Phone: [phone]

## 2023-12-11 DIAGNOSIS — Z992 Dependence on renal dialysis: Secondary | ICD-10-CM | POA: Diagnosis not present

## 2023-12-11 DIAGNOSIS — N186 End stage renal disease: Secondary | ICD-10-CM | POA: Diagnosis not present

## 2023-12-11 DIAGNOSIS — E1122 Type 2 diabetes mellitus with diabetic chronic kidney disease: Secondary | ICD-10-CM | POA: Diagnosis not present

## 2023-12-11 DIAGNOSIS — N2581 Secondary hyperparathyroidism of renal origin: Secondary | ICD-10-CM | POA: Diagnosis not present

## 2023-12-12 ENCOUNTER — Ambulatory Visit (INDEPENDENT_AMBULATORY_CARE_PROVIDER_SITE_OTHER): Admitting: Gastroenterology

## 2023-12-12 DIAGNOSIS — E876 Hypokalemia: Secondary | ICD-10-CM | POA: Diagnosis not present

## 2023-12-12 DIAGNOSIS — Z992 Dependence on renal dialysis: Secondary | ICD-10-CM | POA: Diagnosis not present

## 2023-12-12 DIAGNOSIS — N186 End stage renal disease: Secondary | ICD-10-CM | POA: Diagnosis not present

## 2023-12-12 DIAGNOSIS — N2581 Secondary hyperparathyroidism of renal origin: Secondary | ICD-10-CM | POA: Diagnosis not present

## 2023-12-12 DIAGNOSIS — D631 Anemia in chronic kidney disease: Secondary | ICD-10-CM | POA: Diagnosis not present

## 2023-12-18 ENCOUNTER — Telehealth: Payer: Self-pay

## 2023-12-18 NOTE — Telephone Encounter (Signed)
 Pt's mother called to schedule pt's declot of AVG. Faxed received from nephrology and sent to renal lab for scheduling. Pt's mother is aware they will be calling to schedule this.

## 2023-12-18 NOTE — Telephone Encounter (Signed)
 Returned pt's call left on triage line. LVM for him to call us  back if he still needs assistance.

## 2023-12-20 ENCOUNTER — Other Ambulatory Visit: Payer: Self-pay

## 2023-12-20 ENCOUNTER — Encounter (HOSPITAL_COMMUNITY)
Admission: RE | Disposition: A | Discharge status: Home / Self Care | Payer: Self-pay | Source: Home / Self Care | Attending: Vascular Surgery

## 2023-12-20 ENCOUNTER — Ambulatory Visit (HOSPITAL_COMMUNITY)
Admission: RE | Admit: 2023-12-20 | Discharge: 2023-12-20 | Disposition: A | Discharge status: Home / Self Care | Attending: Vascular Surgery | Admitting: Surgery

## 2023-12-20 DIAGNOSIS — Y832 Surgical operation with anastomosis, bypass or graft as the cause of abnormal reaction of the patient, or of later complication, without mention of misadventure at the time of the procedure: Secondary | ICD-10-CM | POA: Diagnosis not present

## 2023-12-20 DIAGNOSIS — I120 Hypertensive chronic kidney disease with stage 5 chronic kidney disease or end stage renal disease: Secondary | ICD-10-CM | POA: Insufficient documentation

## 2023-12-20 DIAGNOSIS — E1122 Type 2 diabetes mellitus with diabetic chronic kidney disease: Secondary | ICD-10-CM | POA: Insufficient documentation

## 2023-12-20 DIAGNOSIS — T82868A Thrombosis of vascular prosthetic devices, implants and grafts, initial encounter: Secondary | ICD-10-CM | POA: Diagnosis not present

## 2023-12-20 DIAGNOSIS — T82818A Embolism of vascular prosthetic devices, implants and grafts, initial encounter: Secondary | ICD-10-CM

## 2023-12-20 DIAGNOSIS — N186 End stage renal disease: Secondary | ICD-10-CM | POA: Diagnosis not present

## 2023-12-20 DIAGNOSIS — T82858A Stenosis of vascular prosthetic devices, implants and grafts, initial encounter: Secondary | ICD-10-CM

## 2023-12-20 DIAGNOSIS — Z992 Dependence on renal dialysis: Secondary | ICD-10-CM | POA: Diagnosis not present

## 2023-12-20 DIAGNOSIS — N2581 Secondary hyperparathyroidism of renal origin: Secondary | ICD-10-CM | POA: Diagnosis not present

## 2023-12-20 HISTORY — PX: VENOUS ANGIOPLASTY: CATH118376

## 2023-12-20 HISTORY — PX: PERIPHERAL VASCULAR THROMBECTOMY: CATH118306

## 2023-12-20 HISTORY — PX: VENOUS STENT: CATH118377

## 2023-12-20 LAB — GLUCOSE, CAPILLARY: Glucose-Capillary: 214 mg/dL — ABNORMAL HIGH (ref 70–99)

## 2023-12-20 SURGERY — PERIPHERAL VASCULAR THROMBECTOMY
Site: Arm Upper | Laterality: Right

## 2023-12-20 MED ORDER — HEPARIN SODIUM (PORCINE) 1000 UNIT/ML IJ SOLN
INTRAMUSCULAR | Status: AC
  Filled 2023-12-20: qty 10

## 2023-12-20 MED ORDER — IODIXANOL 320 MG/ML IV SOLN
INTRAVENOUS | Status: DC | PRN
  Administered 2023-12-20: 30 mL via INTRAVENOUS

## 2023-12-20 MED ORDER — LIDOCAINE HCL (PF) 1 % IJ SOLN
INTRAMUSCULAR | Status: DC | PRN
  Administered 2023-12-20: 25 mL

## 2023-12-20 MED ORDER — HEPARIN SODIUM (PORCINE) 1000 UNIT/ML IJ SOLN
INTRAMUSCULAR | Status: DC | PRN
  Administered 2023-12-20: 5000 [IU] via INTRAVENOUS

## 2023-12-20 MED ORDER — LIDOCAINE HCL (PF) 1 % IJ SOLN
INTRAMUSCULAR | Status: AC
  Filled 2023-12-20: qty 30

## 2023-12-20 MED ORDER — HEPARIN (PORCINE) IN NACL 1000-0.9 UT/500ML-% IV SOLN
INTRAVENOUS | Status: DC | PRN
  Administered 2023-12-20: 500 mL

## 2023-12-20 SURGICAL SUPPLY — 13 items
BALLOON MUSTANG 5.0X40 75 (BALLOONS) IMPLANT
BALLOON MUSTANG 7X80X75 (BALLOONS) IMPLANT
CATH STR 7FR 55 BRITE (CATHETERS) IMPLANT
GUIDEWIRE ANGLED .035X150CM (WIRE) IMPLANT
KIT ENCORE 26 ADVANTAGE (KITS) IMPLANT
KIT MICROPUNCTURE NIT STIFF (SHEATH) IMPLANT
SHEATH PINNACLE R/O II 6F 4CM (SHEATH) IMPLANT
SHEATH PINNACLE R/O II 7F 4CM (SHEATH) IMPLANT
SHEATH PROBE COVER 6X72 (BAG) IMPLANT
STENT VIABAHN 8X50X75 (Permanent Stent) IMPLANT
TUBING CIL FLEX 10 FLL-RA (TUBING) IMPLANT
WIRE BENTSON .035X145CM (WIRE) IMPLANT
WIRE TORQFLEX AUST .018X40CM (WIRE) IMPLANT

## 2023-12-20 NOTE — Op Note (Signed)
 Patient name: [name] [name] MRN: [id number] DOB: 1961/01/03 Sex: male  12/20/2023 Pre-operative Diagnosis: ESRD on HD, with thrombosed right arm AVG Post-operative diagnosis:  Same Surgeon:  Norman GORMAN Serve, MD Procedure Performed:  Ultrasound-guided access of right arm AVG in an antegrade fashion Ultrasound-guided access of right arm AVG in a retrograde fashion Mechanical thrombectomy right arm AVG Balloon angioplasty and stenting of venous anastomosis, 8 mm x 5 cm Viabahn  Indications: [name] [name] is a 63 year old male with ESRD on HD.  He presented to the HD access center today for AV graft thrombectomy.  His last dialysis session was last Wednesday and on Friday of last week he was noted to have a clotted AV graft.  There are some benefits of thrombectomy with possible dialysis catheter placement reviewed, he expressed understanding and elected to proceed.  Findings:  Clotted AVG with severe stenosis of venous anastomosis.  Widely patent central venous system. Mechanical aspiration with 7 [place] guide cath with retrieval of acute thrombus.  The entire graft was then treated with a 7 mm Mustang balloon to perform balloon maceration of the remaining clot.  From the retrograde access the arterial plug was pulled back into the graft with a 5 mm Mustang. Multiphasic ulnar and radial signals on completion   Procedure:  The patient was identified in the holding area and taken to the cath lab  The patient was then placed supine on the table and prepped and draped in the usual sterile fashion.  A time out was called.  Ultrasound was used to evaluate the right arm AV access. This was accessed in a antegrade fashion under u/s guidance. An 018 wire was advanced without resistance, a micropuncture sheath was placed and fistulagram obtained which demonstrated the above findings.  This access was then upsized to a 42F short sheath over a glidewire.  A 7 [place] guide cath was then placed into the central  system and a central venogram was obtained which demonstrated the above findings.  A total of 2 passes of the 7 [place] guide cath was performed under aspiration with a 20 cc syringe which yielded acute thrombus.  The Glidewire was then replaced and easily passed into the central system and the entire graft was treated with 7 mm Mustang balloon in order to balloon macerate any remaining clot.  A fistulogram was then obtained which demonstrated minimal residual clot in the graft but a severe stenosis of the venous anastomosis.  The fistula was then accessed in a retrograde fashion towards the arterial anastomosis and this was upsized to a 6 [place] sheath over a Glidewire.  The Glidewire was then passed across the venous anastomosis and a 5 mm Mustang balloon was used to pull the arterial plug back into the venous end of the graft.  This was successful and restored pulsatile flow into the graft.  A fistulogram demonstrated flow in the graft but the severe stenosis of the venous anastomosis was causing a disruption of flow and therefore this was treated with an 8 mm x 5 cm Viabahn stent and postdilated with a 7 mm Mustang balloon.  A completion angiogram demonstrated flow through the graft with minimal residual stenosis of the venous anastomosis.  The wires and sheaths were removed and 4-0 Monocryl figure-of-eight sutures were used to manage the access sites for hemostasis. A Doppler that was then used to evaluate the ulnar and radial arteries was demonstrated multiphasic signals.  Contrast: 30 cc   Impression: Successful declot with stenting  of venous anastomosis with 8 mm x 5 cm Viabahn   Norman GORMAN Serve MD [facility] [place] Office: [phone]

## 2023-12-20 NOTE — H&P (Signed)
 HD ACCESS CENTER H&P   Patient ID: TAVARUS POTEETE, male   DOB: 30-Jul-1960, 63 y.o.   MRN: [id number]  Subjective:     HPI DUEL CONRAD is a 63 y.o. male with ESRD presenting to the HD access center for intervention.  Past Medical History:  Diagnosis Date   Anemia    Arthritis    Cardiomyopathy [facility])    July 2022   Diabetes mellitus [facility])    Dialysis patient [facility])    GERD (gastroesophageal reflux disease)    HTN (hypertension)    Hyperlipidemia    Hypothyroidism    Nephrotic syndrome    Obesity    Pulmonary hypertension (HCC)    July 2022 in setting of nephrotic syndrome   Secondary hyperparathyroidism of renal origin (HCC)    Family History  Problem Relation Age of Onset   Hypertension Mother    Diabetes Mother    Cancer Father        prostate cancer   Hypertension Maternal Grandfather    Rheum arthritis Maternal Grandmother    CAD Neg Hx    Past Surgical History:  Procedure Laterality Date   AV FISTULA PLACEMENT Left 12/27/2022   Procedure: INSERTION OF LEFT ARM ARTERIOVENOUS (AV) GORE-TEX GRAFT;  Surgeon: Eliza Lonni RAMAN, MD;  Location: [facility] OR;  Service: Vascular;  Laterality: Left;   AV FISTULA PLACEMENT Right 08/17/2023   Procedure: INSERTION OF ARTERIOVENOUS (AV) GORE-TEX GRAFT RIGHT ARM USING 4-52mm GORETEX STRETCH GRAFT;  Surgeon: Magda Debby SAILOR, MD;  Location: [facility] OR;  Service: Vascular;  Laterality: Right;   AVGG REMOVAL Left 03/15/2023   Procedure: REMOVAL OF LEFT UPPER ARM ARTERIOVENOUS GORETEX GRAFT (AVGG);  Surgeon: Magda Debby SAILOR, MD;  Location[facility] OR;  Service: Vascular;  Laterality: Left;   BIOPSY  01/13/2023   Procedure: BIOPSY;  Surgeon: Cinderella Deatrice FALCON, MD;  [facility];  Service: Endoscopy;;   CATARACT EXTRACTION, BILATERAL  2018   COLONOSCOPY WITH PROPOFOL  N/A 01/13/2023   Procedure: COLONOSCOPY WITH PROPOFOL ;  Surgeon: Cinderella Deatrice FALCON, MD;  [facility];  Service: Endoscopy;  Laterality: N/A;   INSERTION  OF DIALYSIS CATHETER Right 12/27/2022   Procedure: INSERTION OF Right Internal Jugular  DIALYSIS CATHETER;  Surgeon: Eliza Lonni RAMAN, MD;  Location: [facility] OR;  Service: Vascular;  Laterality: Right;   INSERTION OF DIALYSIS CATHETER N/A 03/15/2023   Procedure: INSERTION OF TUNNELED DIALYSIS CATHETER;  Surgeon: Magda Debby SAILOR, MD;  Location: [facility] OR;  Service: Vascular;  Laterality: N/A;   INSERTION OF DIALYSIS CATHETER Right 03/25/2023   Procedure: INSERTION OF RIGHT TUNNELED DIALYSIS CATHETER;  Surgeon: Pearline Norman RAMAN, MD;  Location: [facility] OR;  Service: Vascular;  Laterality: Right;   TEE WITHOUT CARDIOVERSION N/A 03/20/2023   Procedure: TRANSESOPHAGEAL ECHOCARDIOGRAM;  Surgeon: Barbaraann Darryle Debby, MD;  Location: [facility] OR;  Service: Cardiovascular;  Laterality: N/A;   TONSILLECTOMY      Short Social History:  Social History   Tobacco Use   Smoking status: Never   Smokeless tobacco: Never  Substance Use Topics   Alcohol use: Never    No Known Allergies  No current facility-administered medications for this encounter.    REVIEW OF SYSTEMS All other systems were reviewed and are negative     Objective:   Objective   Vitals:   12/20/23 0804 12/20/23 0814  BP: (!) 197/78 (!) 197/78  Pulse: 61 61  Resp: 12 14  Temp: 97.9 F (36.6 C)   TempSrc: Oral  SpO2: 94% 95%   There is no height or weight on file to calculate BMI.  Physical Exam General: no acute distress Cardiac: hemodynamically stable Extremities: No palpable pulse or thrill in right arm AVG     Assessment/Plan:   OMARRI EICH is a 63 y.o. male with ESRD presenting for AVG thrombectomy.  Having issues with clotted AVG, noted on Friday last week. Last HD session Wednesday. Reviewed risks and benefits of AVG thrombectomy, possible TDC placement and patient agreed to proceed.   Norman Serve, MD [facility]

## 2023-12-21 ENCOUNTER — Encounter (HOSPITAL_COMMUNITY): Payer: Self-pay | Admitting: Vascular Surgery

## 2023-12-26 DIAGNOSIS — N2581 Secondary hyperparathyroidism of renal origin: Secondary | ICD-10-CM | POA: Diagnosis not present

## 2023-12-26 DIAGNOSIS — Z992 Dependence on renal dialysis: Secondary | ICD-10-CM | POA: Diagnosis not present

## 2023-12-26 DIAGNOSIS — N186 End stage renal disease: Secondary | ICD-10-CM | POA: Diagnosis not present

## 2023-12-27 DIAGNOSIS — Z992 Dependence on renal dialysis: Secondary | ICD-10-CM | POA: Diagnosis not present

## 2023-12-27 DIAGNOSIS — N186 End stage renal disease: Secondary | ICD-10-CM | POA: Diagnosis not present

## 2023-12-27 DIAGNOSIS — N2581 Secondary hyperparathyroidism of renal origin: Secondary | ICD-10-CM | POA: Diagnosis not present

## 2023-12-27 DIAGNOSIS — E876 Hypokalemia: Secondary | ICD-10-CM | POA: Diagnosis not present

## 2023-12-28 ENCOUNTER — Ambulatory Visit (INDEPENDENT_AMBULATORY_CARE_PROVIDER_SITE_OTHER): Admitting: Podiatry

## 2023-12-28 ENCOUNTER — Encounter: Payer: Self-pay | Admitting: Podiatry

## 2023-12-28 DIAGNOSIS — I96 Gangrene, not elsewhere classified: Secondary | ICD-10-CM | POA: Diagnosis not present

## 2023-12-28 DIAGNOSIS — I739 Peripheral vascular disease, unspecified: Secondary | ICD-10-CM

## 2024-01-01 DIAGNOSIS — N186 End stage renal disease: Secondary | ICD-10-CM | POA: Diagnosis not present

## 2024-01-01 DIAGNOSIS — E876 Hypokalemia: Secondary | ICD-10-CM | POA: Diagnosis not present

## 2024-01-01 DIAGNOSIS — Z992 Dependence on renal dialysis: Secondary | ICD-10-CM | POA: Diagnosis not present

## 2024-01-01 DIAGNOSIS — N2581 Secondary hyperparathyroidism of renal origin: Secondary | ICD-10-CM | POA: Diagnosis not present

## 2024-01-01 NOTE — Progress Notes (Signed)
 Subjective: Chief Complaint  Patient presents with   Diabetic Ulcer    Rm13 F/u toe ulceration left second toe / black discoloration/neuropathy/3 weeks    63 year old male presents the office today for follow-up evaluation of a wound on his left second toe. He is scheduled for arterial studies and vascular evaluation. He does not report any drainage or pus. No increased redness, swelling. No fevers or chills.   Objective: AAO x3, NAD DP, PT pulses decreased. Sensation decreased. Gangrenous changes present to left second toe as pictured below.  This dry.  There is no purulence.  No significant increase in edema.  No fluctuation or crepitation. No pain with calf compression, swelling, warmth, erythema       Assessment: Necrotic tissue left foot  Plan: -All treatment options discussed with the patient including all alternatives, risks, complications.  -Vascular appointments been scheduled as well as arterial studies.  Continue keep the area clean and dry.  Offloading.  Needs to monitor closely for any signs or symptoms of infection directly report emergency room should any occur. Discussed risk of toe amputation.   Return in about 4 weeks (around 01/25/2024).  [name] Fees DPM

## 2024-01-03 ENCOUNTER — Telehealth: Payer: Self-pay | Admitting: Vascular Surgery

## 2024-01-04 NOTE — Telephone Encounter (Signed)
 Pt appt scheduled

## 2024-01-05 DIAGNOSIS — N2581 Secondary hyperparathyroidism of renal origin: Secondary | ICD-10-CM | POA: Diagnosis not present

## 2024-01-05 DIAGNOSIS — N186 End stage renal disease: Secondary | ICD-10-CM | POA: Diagnosis not present

## 2024-01-05 DIAGNOSIS — Z992 Dependence on renal dialysis: Secondary | ICD-10-CM | POA: Diagnosis not present

## 2024-01-10 ENCOUNTER — Encounter (HOSPITAL_COMMUNITY)
Admission: RE | Disposition: A | Discharge status: Home / Self Care | Payer: Self-pay | Source: Home / Self Care | Attending: Vascular Surgery

## 2024-01-10 ENCOUNTER — Other Ambulatory Visit: Payer: Self-pay

## 2024-01-10 ENCOUNTER — Ambulatory Visit (HOSPITAL_COMMUNITY)
Admission: RE | Admit: 2024-01-10 | Discharge: 2024-01-10 | Disposition: A | Discharge status: Home / Self Care | Attending: Vascular Surgery | Admitting: Vascular Surgery

## 2024-01-10 DIAGNOSIS — Z992 Dependence on renal dialysis: Secondary | ICD-10-CM | POA: Insufficient documentation

## 2024-01-10 DIAGNOSIS — I120 Hypertensive chronic kidney disease with stage 5 chronic kidney disease or end stage renal disease: Secondary | ICD-10-CM | POA: Insufficient documentation

## 2024-01-10 DIAGNOSIS — T82868A Thrombosis of vascular prosthetic devices, implants and grafts, initial encounter: Secondary | ICD-10-CM | POA: Diagnosis not present

## 2024-01-10 DIAGNOSIS — Y832 Surgical operation with anastomosis, bypass or graft as the cause of abnormal reaction of the patient, or of later complication, without mention of misadventure at the time of the procedure: Secondary | ICD-10-CM | POA: Diagnosis not present

## 2024-01-10 DIAGNOSIS — N186 End stage renal disease: Secondary | ICD-10-CM | POA: Diagnosis not present

## 2024-01-10 DIAGNOSIS — E1122 Type 2 diabetes mellitus with diabetic chronic kidney disease: Secondary | ICD-10-CM | POA: Insufficient documentation

## 2024-01-10 HISTORY — PX: DIALYSIS/PERMA CATHETER INSERTION: CATH118288

## 2024-01-10 LAB — GLUCOSE, CAPILLARY
Glucose-Capillary: 204 mg/dL — ABNORMAL HIGH (ref 70–99)
Glucose-Capillary: 147 mg/dL — ABNORMAL HIGH (ref 70–99)

## 2024-01-10 SURGERY — DIALYSIS/PERMA CATHETER INSERTION
Anesthesia: LOCAL | Site: Arm Upper | Laterality: Right

## 2024-01-10 MED ORDER — LIDOCAINE HCL (PF) 1 % IJ SOLN
INTRAMUSCULAR | Status: AC
  Filled 2024-01-10: qty 30

## 2024-01-10 MED ORDER — HEPARIN SODIUM (PORCINE) 1000 UNIT/ML IJ SOLN
INTRAMUSCULAR | Status: AC
  Filled 2024-01-10: qty 10

## 2024-01-10 MED ORDER — HEPARIN (PORCINE) IN NACL 1000-0.9 UT/500ML-% IV SOLN
INTRAVENOUS | Status: DC | PRN
  Administered 2024-01-10: 500 mL

## 2024-01-10 MED ORDER — HEPARIN SODIUM (PORCINE) 1000 UNIT/ML IJ SOLN
INTRAMUSCULAR | Status: DC | PRN
  Administered 2024-01-10 (×2): 1600 [IU] via INTRAVENOUS

## 2024-01-10 MED ORDER — LIDOCAINE HCL (PF) 1 % IJ SOLN
INTRAMUSCULAR | Status: DC | PRN
  Administered 2024-01-10: 5 mL via SUBCUTANEOUS
  Administered 2024-01-10: 10 mL via SUBCUTANEOUS

## 2024-01-10 SURGICAL SUPPLY — 4 items
CATH PALINDROME-P 19CM W/VT (CATHETERS) IMPLANT
KIT MICROPUNCTURE NIT STIFF (SHEATH) IMPLANT
SHEATH PROBE COVER 6X72 (BAG) IMPLANT
TRAY PV CATH (CUSTOM PROCEDURE TRAY) IMPLANT

## 2024-01-10 NOTE — Op Note (Signed)
    Patient name: EDY BELT MRN: [id number] DOB: July 12, 1960 Sex: male  01/10/2024 Pre-operative Diagnosis: ESRD with need for permanent hemodialysis access Post-operative diagnosis:  Same Surgeon:  Lonni DOROTHA Gaskins, MD Procedure Performed: 1.  Ultrasound-guided access right internal jugular vein 2.  Placement of right internal jugular vein tunneled dialysis catheter (19 cm palindrome)  Indications: 63 year old male with end-stage renal disease that was using a right arm AV fistula.  This recently underwent declot on 12/20/2023.  This fistula is clotted again.  He presents for dialysis catheter placement after risks benefits discussed.  Findings:   Ultrasound-guided access of right internal jugular vein.  Placement of 19 cm palindrome catheter with the tip at the cavoatrial junction under fluoroscopic guidance.  This flushed and aspirated easily.   Procedure:  The patient was identified in the holding area and taken to [facility] lab.  The patient was then placed on the table in the supine position.  A timeout was performed.  I did evaluate the right internal jugular vein with ultrasound and it was patent.  The right neck was then prepped and draped in standard sterile fashion including the right chest.  I then used 1% lidocaine  without epinephrine  to anesthetize the right neck and right chest wall.  I then used ultrasound guidance to access the right internal jugular vein with micro access needle and placed a microwire and micro sheath.  I then advanced a J-wire into the right atrium under fluoroscopic guidance.  I measured 19 cm palindrome catheter on the right chest wall.  I made a counterincision on the chest wall.  I tunneled the catheter from the chest exit site to the IJ stick site.  I then dilated over the wire and placed a large dilator peel-away sheath into the right atrium under fluoroscopic guidance.  I then placed the catheter through the sheath and the sheath was peeled away.  I then  positioned this to make sure it was not kinked.  It flushed and aspirated easily.  The neck incision was closed with 4-0 Monocryl.  The catheter exit site was closed with a 2-0 nylon.  Dermabond was applied.  It was loaded with heparin  saline according manufactures recommendations.  Tolerated the procedure with no immediate complications.   Lonni DOROTHA Gaskins, MD [facility] [place] Office: [phone]

## 2024-01-10 NOTE — H&P (Signed)
 H&P    MRN #:  [id number]  History of Present Illness: This is a 63 y.o. male with end-stage renal disease that was referred for declot.  Patient just went right arm AV fistula declot on 12/20/2023.  This is clotted again.  Plan is [facility] placement after discussion with Dr. Pearline.  Past Medical History:  Diagnosis Date   Anemia    Arthritis    Cardiomyopathy [facility])    July 2022   Diabetes mellitus [facility])    Dialysis patient [facility])    GERD (gastroesophageal reflux disease)    HTN (hypertension)    Hyperlipidemia    Hypothyroidism    Nephrotic syndrome    Obesity    Pulmonary hypertension (HCC)    July 2022 in setting of nephrotic syndrome   Secondary hyperparathyroidism of renal origin [facility])     Past Surgical History:  Procedure Laterality Date   AV FISTULA PLACEMENT Left 12/27/2022   Procedure: INSERTION OF LEFT ARM ARTERIOVENOUS (AV) GORE-TEX GRAFT;  Surgeon: Eliza Lonni RAMAN, MD;  Location: [facility] OR;  Service: Vascular;  Laterality: Left;   AV FISTULA PLACEMENT Right 08/17/2023   Procedure: INSERTION OF ARTERIOVENOUS (AV) GORE-TEX GRAFT RIGHT ARM USING 4-70mm GORETEX STRETCH GRAFT;  Surgeon: Magda Debby SAILOR, MD;  Location: [facility] OR;  Service: Vascular;  Laterality: Right;   AVGG REMOVAL Left 03/15/2023   Procedure: REMOVAL OF LEFT UPPER ARM ARTERIOVENOUS GORETEX GRAFT (AVGG);  Surgeon: Magda Debby SAILOR, MD;  Location: [facility] OR;  Service: Vascular;  Laterality: Left;   BIOPSY  01/13/2023   Procedure: BIOPSY;  Surgeon: Cinderella Deatrice FALCON, MD;  [facility];  Service: Endoscopy;;   CATARACT EXTRACTION, BILATERAL  2018   COLONOSCOPY WITH PROPOFOL  N/A 01/13/2023   Procedure: COLONOSCOPY WITH PROPOFOL ;  Surgeon: Cinderella Deatrice FALCON, MD;  [facility];  Service: Endoscopy;  Laterality: N/A;   INSERTION OF DIALYSIS CATHETER Right 12/27/2022   Procedure: INSERTION OF Right Internal Jugular  DIALYSIS CATHETER;  Surgeon: Eliza Lonni RAMAN, MD;  Location: [facility] OR;   Service: Vascular;  Laterality: Right;   INSERTION OF DIALYSIS CATHETER N/A 03/15/2023   Procedure: INSERTION OF TUNNELED DIALYSIS CATHETER;  Surgeon: Magda Debby SAILOR, MD;  Location: [facility] OR;  Service: Vascular;  Laterality: N/A;   INSERTION OF DIALYSIS CATHETER Right 03/25/2023   Procedure: INSERTION OF RIGHT TUNNELED DIALYSIS CATHETER;  Surgeon: Pearline Norman RAMAN, MD;  Location: [facility] OR;  Service: Vascular;  Laterality: Right;   PERIPHERAL VASCULAR THROMBECTOMY Right 12/20/2023   Procedure: PERIPHERAL VASCULAR THROMBECTOMY;  Surgeon: Pearline Norman RAMAN, MD;  Location: [facility] LAB;  Service: Cardiovascular;  Laterality: Right;   TEE WITHOUT CARDIOVERSION N/A 03/20/2023   Procedure: TRANSESOPHAGEAL ECHOCARDIOGRAM;  Surgeon: Barbaraann Darryle Debby, MD;  Location: [facility] OR;  Service: Cardiovascular;  Laterality: N/A;   TONSILLECTOMY     VENOUS ANGIOPLASTY  12/20/2023   Procedure: VENOUS ANGIOPLASTY;  Surgeon: Pearline Norman RAMAN, MD;  Location: [facility];  Service: Cardiovascular;;   VENOUS STENT  12/20/2023   Procedure: VENOUS STENT;  Surgeon: Pearline Norman RAMAN, MD;  Location: [facility];  Service: Cardiovascular;;  outflow    No Known Allergies  Prior to Admission medications   Medication Sig Start Date End Date Taking? Authorizing Provider  Accu-Chek Softclix Lancets lancets  08/06/20   [provider]  ACETAMINOPHEN  PO Take 650 mg by mouth every 8 (eight) hours as needed for moderate pain (pain score 4-6).    [provider]  amitriptyline  (ELAVIL ) 10  MG tablet Take 10 mg by mouth at bedtime.    [provider]  amLODipine  (NORVASC ) 10 MG tablet Take 1 tablet (10 mg total) by mouth every morning. 03/29/23   Regalado, Belkys A, MD  calcium  acetate (PHOSLO) 667 MG capsule Take 1,334 mg by mouth See admin instructions. Take 1334 mg with each meal 07/07/23 06/27/24  [provider]  Cyanocobalamin  (VITAMIN B12 PO) Take 1 tablet by mouth at bedtime.    [provider]   DM-APAP-CPM (CORICIDIN HBP PO) Take 2 tablets by mouth daily as needed (congestion).    [provider]  doxycycline  (VIBRA -TABS) 100 MG tablet Take 1 tablet (100 mg total) by mouth 2 (two) times daily. 11/23/23   Gershon Donnice SAUNDERS, DPM  gabapentin  (NEURONTIN ) 100 MG capsule Take 100 mg by mouth at bedtime. 07/05/23   [provider]  hydrALAZINE  (APRESOLINE ) 10 MG tablet Take 1 tablet (10 mg total) by mouth every 8 (eight) hours. 03/29/23   Regalado, Belkys A, MD  levothyroxine  (SYNTHROID ) 50 MCG tablet Take 50 mcg by mouth daily before breakfast.    [provider]  metoprolol  tartrate (LOPRESSOR ) 50 MG tablet Take 1 tablet (50 mg total) by mouth 2 (two) times daily. 06/16/20   Comer Kirsch, PA-C  mupirocin  ointment (BACTROBAN ) 2 % Apply 1 Application topically 2 (two) times daily. 11/23/23   Gershon Donnice SAUNDERS, DPM  pantoprazole  (PROTONIX ) 40 MG tablet Take 1 tablet (40 mg total) by mouth daily. 30 minutes before breakfast 06/01/23   Shirlean Therisa ORN, NP  Probiotic Product (PROBIOTIC DAILY PO) Take 1 capsule by mouth at bedtime.    [provider]  senna-docusate (SENOKOT-S) 8.6-50 MG tablet Take 1 tablet by mouth daily as needed for mild constipation.    [provider]    Social History   Socioeconomic History   Marital status: Single    Spouse name: Not on file   Number of children: Not on file   Years of education: Not on file   Highest education level: Not on file  Occupational History   Not on file  Tobacco Use   Smoking status: Never   Smokeless tobacco: Never  Vaping Use   Vaping status: Never Used  Substance and Sexual Activity   Alcohol use: Never   Drug use: Never   Sexual activity: Not on file  Other Topics Concern   Not on file  Social History Narrative   Not on file   Social Drivers of Health   Financial Resource Strain: Not on file  Food Insecurity: No Food Insecurity (03/06/2023)   Hunger Vital Sign    Worried  About Running Out of Food in the Last Year: Never true    Ran Out of Food in the Last Year: Never true  Transportation Needs: No Transportation Needs (03/06/2023)   PRAPARE - Administrator, Civil Service (Medical): No    Lack of Transportation (Non-Medical): No  Physical Activity: Not on file  Stress: Not on file  Social Connections: Not on file  Intimate Partner Violence: Not At Risk (03/06/2023)   Humiliation, Afraid, Rape, and Kick questionnaire    Fear of Current or Ex-Partner: No    Emotionally Abused: No    Physically Abused: No    Sexually Abused: No     Family History  Problem Relation Age of Onset   Hypertension Mother    Diabetes Mother    Cancer Father        prostate  cancer   Hypertension Maternal Grandfather    Rheum arthritis Maternal Grandmother    CAD Neg Hx     ROS: [x]  Positive   [ ]  Negative   [ ]  All sytems reviewed and are negative  Cardiovascular: []  chest pain/pressure []  palpitations []  SOB lying flat []  DOE []  pain in legs while walking []  pain in legs at rest []  pain in legs at night []  non-healing ulcers []  hx of DVT []  swelling in legs  Pulmonary: []  productive cough []  asthma/wheezing []  home O2  Neurologic: []  weakness in []  arms []  legs []  numbness in []  arms []  legs []  hx of CVA []  mini stroke [] difficulty speaking or slurred speech []  temporary loss of vision in one eye []  dizziness  Hematologic: []  hx of cancer []  bleeding problems []  problems with blood clotting easily  Endocrine:   []  diabetes []  thyroid  disease  GI []  vomiting blood []  blood in stool  GU: []  CKD/renal failure []  HD--[]  M/W/F or []  T/T/S []  burning with urination []  blood in urine  Psychiatric: []  anxiety []  depression  Musculoskeletal: []  arthritis []  joint pain  Integumentary: []  rashes []  ulcers  Constitutional: []  fever []  chills   Physical Examination  Vitals:   01/10/24 1051 01/10/24 1108  BP: (!) 180/75 (!)  186/74  Pulse: (!) 45 (!) 51  Resp: 12 12  Temp: 97.9 F (36.6 C)   SpO2: 97% 99%   There is no height or weight on file to calculate BMI.  General:  NAD Gait: Not observed HENT: WNL, normocephalic Pulmonary: normal non-labored breathing Cardiac: regular, without  Murmurs, rubs or gallops Abdomen:  soft, NT/ND, no masses Vascular Exam/Pulses: Right arm AVF no thrill  CBC    Component Value Date/Time   WBC 10.5 09/03/2023 1854   RBC 3.12 (L) 09/03/2023 1854   HGB 9.2 (L) 09/03/2023 1854   HCT 30.1 (L) 09/03/2023 1854   PLT 229 09/03/2023 1854   MCV 96.5 09/03/2023 1854   MCH 29.5 09/03/2023 1854   MCHC 30.6 09/03/2023 1854   RDW 14.2 09/03/2023 1854   LYMPHSABS 1.7 09/03/2023 1854   MONOABS 0.5 09/03/2023 1854   EOSABS 0.3 09/03/2023 1854   BASOSABS 0.1 09/03/2023 1854    BMET    Component Value Date/Time   NA 137 09/03/2023 1854   K 4.0 09/03/2023 1854   CL 98 09/03/2023 1854   CO2 25 09/03/2023 1854   GLUCOSE 106 (H) 09/03/2023 1854   BUN 44 (H) 09/03/2023 1854   CREATININE 7.90 (H) 09/03/2023 1854   CALCIUM  9.0 09/03/2023 1854   GFRNONAA 7 (L) 09/03/2023 1854   GFRAA 48 (L) 01/31/2020 1216    COAGS: Lab Results  Component Value Date   INR 1.1 05/19/2023   INR 1.1 03/06/2023   INR 1.1 08/30/2020     Non-Invasive Vascular Imaging:    N/A   ASSESSMENT/PLAN: This is a 63 y.o. male with end-stage renal disease that was referred for declot.  Patient just underwent right arm AV fistula declot on 12/20/2023 with Dr. Pearline.  This is clotted again.  Plan is [facility] placement after discussion with Dr. Pearline.  Lonni DOROTHA Gaskins, MD [facility] [place] Office: [phone]  Lonni JINNY Gaskins

## 2024-01-11 ENCOUNTER — Encounter (HOSPITAL_COMMUNITY): Payer: Self-pay | Admitting: Vascular Surgery

## 2024-01-11 ENCOUNTER — Telehealth: Payer: Self-pay | Admitting: Vascular Surgery

## 2024-01-11 DIAGNOSIS — E1122 Type 2 diabetes mellitus with diabetic chronic kidney disease: Secondary | ICD-10-CM | POA: Diagnosis not present

## 2024-01-11 DIAGNOSIS — Z992 Dependence on renal dialysis: Secondary | ICD-10-CM | POA: Diagnosis not present

## 2024-01-11 DIAGNOSIS — N186 End stage renal disease: Secondary | ICD-10-CM | POA: Diagnosis not present

## 2024-01-11 NOTE — Telephone Encounter (Signed)
-----   Message from Lonni JINNY Gaskins sent at 01/10/2024  4:08 PM EDT ----- Needs appointment for new access with vein mapping upper arms.  Next available provider.  Thanks,  [place]

## 2024-01-12 DIAGNOSIS — N186 End stage renal disease: Secondary | ICD-10-CM | POA: Diagnosis not present

## 2024-01-12 DIAGNOSIS — Z992 Dependence on renal dialysis: Secondary | ICD-10-CM | POA: Diagnosis not present

## 2024-01-12 DIAGNOSIS — N2581 Secondary hyperparathyroidism of renal origin: Secondary | ICD-10-CM | POA: Diagnosis not present

## 2024-01-15 DIAGNOSIS — N186 End stage renal disease: Secondary | ICD-10-CM | POA: Diagnosis not present

## 2024-01-15 DIAGNOSIS — N2581 Secondary hyperparathyroidism of renal origin: Secondary | ICD-10-CM | POA: Diagnosis not present

## 2024-01-15 DIAGNOSIS — E876 Hypokalemia: Secondary | ICD-10-CM | POA: Diagnosis not present

## 2024-01-15 DIAGNOSIS — Z992 Dependence on renal dialysis: Secondary | ICD-10-CM | POA: Diagnosis not present

## 2024-01-15 NOTE — Progress Notes (Unsigned)
 V[facility] SPECIALISTS OF [place]  ASSESSMENT / PLAN: [name] [name] is a 63 y.o. male with atherosclerosis of native arteries of left lower extremity causing gangrene.  Recommend:  Abstinence from all tobacco products. Blood glucose control with goal A1c < 7%. Blood pressure control with goal blood pressure < 130/80 mmHg. Lipid reduction therapy with goal LDL-C < 55 mg/dL. Aspirin 81mg  by mouth daily. Atorvastatin  40-80mg  PO QD (or other high intensity statin therapy).  Counseled patient about limb-threatening nature of this diagnosis.  Plan left lower extremity angiogram with possible intervention via right common femoral approach in cath lab as schedule permits.  CHIEF COMPLAINT: Gangrene  HISTORY OF PRESENT ILLNESS: [name] [name] is a 63 y.o. male well-known to me for prior dialysis access surgery, who is referred to clinic for evaluation of gangrene of the left lower extremity.  This is been present for several weeks.  He has been under the care of Dr. Gershon of [facility].  He has had some progression of gangrene since he saw Dr. Gershon on 12/28/2023.  Patient reports no pain, claudication symptoms.   Past Medical History:  Diagnosis Date   Anemia    Arthritis    Cardiomyopathy [facility])    July 2022   Diabetes mellitus [facility])    Dialysis patient [facility])    GERD (gastroesophageal reflux disease)    HTN (hypertension)    Hyperlipidemia    Hypothyroidism    Nephrotic syndrome    Obesity    Pulmonary hypertension (HCC)    July 2022 in setting of nephrotic syndrome   Secondary hyperparathyroidism of renal origin [facility])     Past Surgical History:  Procedure Laterality Date   AV FISTULA PLACEMENT Left 12/27/2022   Procedure: INSERTION OF LEFT ARM ARTERIOVENOUS (AV) GORE-TEX GRAFT;  Surgeon: Eliza Lonni RAMAN, MD;  Location: [facility] OR;  Service: Vascular;  Laterality: Left;   AV FISTULA PLACEMENT Right 08/17/2023   Procedure: INSERTION OF ARTERIOVENOUS  (AV) GORE-TEX GRAFT RIGHT ARM USING 4-59mm GORETEX STRETCH GRAFT;  Surgeon: Magda Debby SAILOR, MD;  [facility]: [facility] OR;  Service: Vascular;  Laterality: Right;   AVGG REMOVAL Left 03/15/2023   Procedure: REMOVAL OF LEFT UPPER ARM ARTERIOVENOUS GORETEX GRAFT (AVGG);  Surgeon: Magda Debby SAILOR, MD;  Location: [facility] OR;  Service: Vascular;  Laterality: Left;   BIOPSY  01/13/2023   Procedure: BIOPSY;  Surgeon: Cinderella Deatrice FALCON, MD;  [facility];  Service: Endoscopy;;   CATARACT EXTRACTION, BILATERAL  2018   COLONOSCOPY WITH PROPOFOL  N/A 01/13/2023   Procedure: COLONOSCOPY WITH PROPOFOL ;  Surgeon: Cinderella Deatrice FALCON, MD;  [facility];  Service: Endoscopy;  Laterality: N/A;   DIALYSIS/PERMA CATHETER INSERTION Right 01/10/2024   Procedure: DIALYSIS/PERMA CATHETER INSERTION;  Surgeon: Gretta Lonni PARAS, MD;  Location: [facility];  Service: Cardiovascular;  Laterality: Right;   INSERTION OF DIALYSIS CATHETER Right 12/27/2022   Procedure: INSERTION OF Right Internal Jugular  DIALYSIS CATHETER;  Surgeon: Eliza Lonni RAMAN, MD;  Location: [facility] OR;  Service: Vascular;  Laterality: Right;   INSERTION OF DIALYSIS CATHETER N/A 03/15/2023   Procedure: INSERTION OF TUNNELED DIALYSIS CATHETER;  Surgeon: Magda Debby SAILOR, MD;  [facility];  Service: Vascular;  Laterality: N/A;   INSERTION OF DIALYSIS CATHETER Right 03/25/2023   Procedure: INSERTION OF RIGHT TUNNELED DIALYSIS CATHETER;  Surgeon: Pearline Norman RAMAN, MD;  Location: [facility] OR;  Service: Vascular;  Laterality: Right;   PERIPHERAL VASCULAR THROMBECTOMY Right 12/20/2023   Procedure:  PERIPHERAL VASCULAR THROMBECTOMY;  Surgeon: Pearline Norman RAMAN, MD;  Location: [facility];  Service: Cardiovascular;  Laterality: Right;   TEE WITHOUT CARDIOVERSION N/A 03/20/2023   Procedure: TRANSESOPHAGEAL ECHOCARDIOGRAM;  Surgeon: Barbaraann Darryle Ned, MD;  Location: [facility] OR;  Service: Cardiovascular;  Laterality: N/A;   TONSILLECTOMY     VENOUS ANGIOPLASTY   12/20/2023   Procedure: VENOUS ANGIOPLASTY;  Surgeon: Pearline Norman RAMAN, MD;  Location: [facility];  Service: Cardiovascular;;   VENOUS STENT  12/20/2023   Procedure: VENOUS STENT;  Surgeon: Pearline Norman RAMAN, MD;  Location: [facility];  Service: Cardiovascular;;  outflow    Family History  Problem Relation Age of Onset   Hypertension Mother    Diabetes Mother    Cancer Father        prostate cancer   Hypertension Maternal Grandfather    Rheum arthritis Maternal Grandmother    CAD Neg Hx     Social History   Socioeconomic History   Marital status: Single    Spouse name: Not on file   Number of children: Not on file   Years of education: Not on file   Highest education level: Not on file  Occupational History   Not on file  Tobacco Use   Smoking status: Never   Smokeless tobacco: Never  Vaping Use   Vaping status: Never Used  Substance and Sexual Activity   Alcohol use: Never   Drug use: Never   Sexual activity: Not on file  Other Topics Concern   Not on file  Social History Narrative   Not on file   Social Drivers of Health   Financial Resource Strain: Not on file  Food Insecurity: No Food Insecurity (03/06/2023)   Hunger Vital Sign    Worried About Running Out of Food in the Last Year: Never true    Ran Out of Food in the Last Year: Never true  Transportation Needs: No Transportation Needs (03/06/2023)   PRAPARE - Administrator, Civil Service (Medical): No    Lack of Transportation (Non-Medical): No  Physical Activity: Not on file  Stress: Not on file  Social Connections: Not on file  Intimate Partner Violence: Not At Risk (03/06/2023)   Humiliation, Afraid, Rape, and Kick questionnaire    Fear of Current or Ex-Partner: No    Emotionally Abused: No    Physically Abused: No    Sexually Abused: No    No Known Allergies  Current Outpatient Medications  Medication Sig Dispense Refill   Accu-Chek Softclix Lancets lancets      ACETAMINOPHEN  PO Take  650 mg by mouth every 8 (eight) hours as needed for moderate pain (pain score 4-6).     amitriptyline  (ELAVIL ) 10 MG tablet Take 10 mg by mouth at bedtime.     amLODipine  (NORVASC ) 10 MG tablet Take 1 tablet (10 mg total) by mouth every morning. 30 tablet 0   calcium  acetate (PHOSLO) 667 MG capsule Take 1,334 mg by mouth See admin instructions. Take 1334 mg with each meal     Cyanocobalamin  (VITAMIN B12 PO) Take 1 tablet by mouth at bedtime.     DM-APAP-CPM (CORICIDIN HBP PO) Take 2 tablets by mouth daily as needed (congestion).     doxycycline  (VIBRA -TABS) 100 MG tablet Take 1 tablet (100 mg total) by mouth 2 (two) times daily. 20 tablet 0   gabapentin  (NEURONTIN ) 100 MG capsule Take 100 mg by mouth at bedtime.     hydrALAZINE  (APRESOLINE ) 10  MG tablet Take 1 tablet (10 mg total) by mouth every 8 (eight) hours. 90 tablet 0   levothyroxine  (SYNTHROID ) 50 MCG tablet Take 50 mcg by mouth daily before breakfast.     metoprolol  tartrate (LOPRESSOR ) 50 MG tablet Take 1 tablet (50 mg total) by mouth 2 (two) times daily. 60 tablet 0   mupirocin  ointment (BACTROBAN ) 2 % Apply 1 Application topically 2 (two) times daily. 30 g 2   pantoprazole  (PROTONIX ) 40 MG tablet Take 1 tablet (40 mg total) by mouth daily. 30 minutes before breakfast 90 tablet 3   Probiotic Product (PROBIOTIC DAILY PO) Take 1 capsule by mouth at bedtime.     senna-docusate (SENOKOT-S) 8.6-50 MG tablet Take 1 tablet by mouth daily as needed for mild constipation.     No current facility-administered medications for this visit.    PHYSICAL EXAM Vitals:   01/16/24 1259  BP: (!) 178/73  Pulse: (!) 55  Temp: 98 F (36.7 C)  SpO2: 94%    Chronically ill man in no distress Regular rate and rhythm Unlabored breathing No palpable pedal pulses Left second toe gangrene throughout most of the digit.  Early skin changes of gangrene in the third digit as well  PERTINENT LABORATORY AND RADIOLOGIC DATA  Most recent CBC    Latest Ref  Rng & Units 09/03/2023    6:54 PM 08/17/2023   12:33 PM 06/05/2023   11:23 AM  CBC  WBC 4.0 - 10.5 K/uL 10.5     Hemoglobin 13.0 - 17.0 g/dL 9.2  9.9  89.7   Hematocrit 39.0 - 52.0 % 30.1  29.0  30.0   Platelets 150 - 400 K/uL 229        Most recent CMP    Latest Ref Rng & Units 09/03/2023    6:54 PM 08/17/2023   12:33 PM 06/05/2023   11:57 AM  CMP  Glucose 70 - 99 mg/dL 893  895  97   BUN 8 - 23 mg/dL 44  50  17   Creatinine 0.61 - 1.24 mg/dL 2.09  1.69  6.19   Sodium 135 - 145 mmol/L 137  138  136   Potassium 3.5 - 5.1 mmol/L 4.0  4.5  3.5   Chloride 98 - 111 mmol/L 98  107  98   CO2 22 - 32 mmol/L 25   28   Calcium  8.9 - 10.3 mg/dL 9.0   8.7     Renal function CrCl cannot be calculated (Patient's most recent lab result is older than the maximum 21 days allowed.).  Hgb A1c MFr Bld (%)  Date Value  01/11/2023 5.1    LDL Cholesterol  Date Value Ref Range Status  04/27/2020 102 (H) 0 - 99 mg/dL Final    Comment:           Total Cholesterol/HDL:CHD Risk Coronary Heart Disease Risk Table                     Men   Women  1/2 Average Risk   3.4   3.3  Average Risk       5.0   4.4  2 X Average Risk   9.6   7.1  3 X Average Risk  23.4   11.0        Use the calculated Patient Ratio above and the CHD Risk Table to determine the patient's CHD Risk.        ATP III CLASSIFICATION (LDL):  <100  mg/dL   Optimal  899-870  mg/dL   Near or Above                    Optimal  130-159  mg/dL   Borderline  839-810  mg/dL   High  >809     mg/dL   Very High Performed at [facility], [address], [place], [place] [address]      +-------+-----------+-----------+------------+------------+  ABI/TBIToday's ABIToday's TBIPrevious ABIPrevious TBI  +-------+-----------+-----------+------------+------------+  Right [place]         Absent                               +-------+-----------+-----------+------------+------------+  Left  [place]         Absent                                +-------+-----------+-----------+------------+------------+     Debby SAILOR. Magda, MD FACS [facility] Phone Number: [phone] 01/15/2024 3:59 PM   Total time spent on preparing this encounter including chart review, data review, collecting history, examining the patient, and coordinating care: 45 minutes  Portions of this report may have been transcribed using voice recognition software.  Every effort has been made to ensure accuracy; however, inadvertent computerized transcription errors may still be present.

## 2024-01-15 NOTE — H&P (View-Only) (Signed)
 VASCULAR AND VEIN SPECIALISTS OF [place]  ASSESSMENT / PLAN: [name] [name] is a 63 y.o. male with atherosclerosis of native arteries of left lower extremity causing gangrene.  Recommend:  Abstinence from all tobacco products. Blood glucose control with goal A1c < 7%. Blood pressure control with goal blood pressure < 130/80 mmHg. Lipid reduction therapy with goal LDL-C < 55 mg/dL. Aspirin  81mg  by mouth daily. Atorvastatin  40-80mg  PO QD (or other high intensity statin therapy).  Counseled patient about limb-threatening nature of this diagnosis.  Plan left lower extremity angiogram with possible intervention via right common femoral approach in cath lab as schedule permits.  CHIEF COMPLAINT: Gangrene  HISTORY OF PRESENT ILLNESS: [name] [name] is a 63 y.o. male well-known to me for prior dialysis access surgery, who is referred to clinic for evaluation of gangrene of the left lower extremity.  This is been present for several weeks.  He has been under the care of Dr. Gershon of [facility].  He has had some progression of gangrene since he saw Dr. Gershon on 12/28/2023.  Patient reports no pain, claudication symptoms.   Past Medical History:  Diagnosis Date   Anemia    Arthritis    Cardiomyopathy [facility])    July 2022   Diabetes mellitus [facility])    Dialysis patient [facility])    GERD (gastroesophageal reflux disease)    HTN (hypertension)    Hyperlipidemia    Hypothyroidism    Nephrotic syndrome    Obesity    Pulmonary hypertension (HCC)    July 2022 in setting of nephrotic syndrome   Secondary hyperparathyroidism of renal origin [facility])     Past Surgical History:  Procedure Laterality Date   AV FISTULA PLACEMENT Left 12/27/2022   Procedure: INSERTION OF LEFT ARM ARTERIOVENOUS (AV) GORE-TEX GRAFT;  Surgeon: Eliza Lonni RAMAN, MD;  Location: [facility] OR;  Service: Vascular;  Laterality: Left;   AV FISTULA PLACEMENT Right 08/17/2023   Procedure: INSERTION OF ARTERIOVENOUS  (AV) GORE-TEX GRAFT RIGHT ARM USING 4-40mm GORETEX STRETCH GRAFT;  Surgeon: Magda Debby SAILOR, MD;  Location: [facility] OR;  Service: Vascular;  Laterality: Right;   AVGG REMOVAL Left 03/15/2023   Procedure: REMOVAL OF LEFT UPPER ARM ARTERIOVENOUS GORETEX GRAFT (AVGG);  Surgeon: Magda Debby SAILOR, MD;  Location: [facility] OR;  Service: Vascular;  Laterality: Left;   BIOPSY  01/13/2023   Procedure: BIOPSY;  Surgeon: Cinderella Deatrice FALCON, MD;  [facility];  Service: Endoscopy;;   CATARACT EXTRACTION, BILATERAL  2018   COLONOSCOPY WITH PROPOFOL  N/A 01/13/2023   Procedure: COLONOSCOPY WITH PROPOFOL ;  Surgeon: Cinderella Deatrice FALCON, MD;  [facility];  Service: Endoscopy;  Laterality: N/A;   DIALYSIS/PERMA CATHETER INSERTION Right 01/10/2024   Procedure: DIALYSIS/PERMA CATHETER INSERTION;  Surgeon: Gretta Lonni PARAS, MD;  Location: [facility];  Service: Cardiovascular;  Laterality: Right;   INSERTION OF DIALYSIS CATHETER Right 12/27/2022   Procedure: INSERTION OF Right Internal Jugular  DIALYSIS CATHETER;  Surgeon: Eliza Lonni RAMAN, MD;  Location: [facility] OR;  Service: Vascular;  Laterality: Right;   INSERTION OF DIALYSIS CATHETER N/A 03/15/2023   Procedure: INSERTION OF TUNNELED DIALYSIS CATHETER;  Surgeon: Magda Debby SAILOR, MD;  Location[facility] OR;  Service: Vascular;  Laterality: N/A;   INSERTION OF DIALYSIS CATHETER Right 03/25/2023   Procedure: INSERTION OF RIGHT TUNNELED DIALYSIS CATHETER;  Surgeon: Pearline Norman RAMAN, MD;  Location: [facility] OR;  Service: Vascular;  Laterality: Right;   PERIPHERAL VASCULAR THROMBECTOMY Right 12/20/2023   Procedure:  PERIPHERAL VASCULAR THROMBECTOMY;  Surgeon: Pearline Norman RAMAN, MD;  Location: [facility];  Service: Cardiovascular;  Laterality: Right;   TEE WITHOUT CARDIOVERSION N/A 03/20/2023   Procedure: TRANSESOPHAGEAL ECHOCARDIOGRAM;  Surgeon: Barbaraann Darryle Ned, MD;  Location: [facility] OR;  Service: Cardiovascular;  Laterality: N/A;   TONSILLECTOMY     VENOUS ANGIOPLASTY   12/20/2023   Procedure: VENOUS ANGIOPLASTY;  Surgeon: Pearline Norman RAMAN, MD;  Location: [facility];  Service: Cardiovascular;;   VENOUS STENT  12/20/2023   Procedure: VENOUS STENT;  Surgeon: Pearline Norman RAMAN, MD;  Location: [facility];  Service: Cardiovascular;;  outflow    Family History  Problem Relation Age of Onset   Hypertension Mother    Diabetes Mother    Cancer Father        prostate cancer   Hypertension Maternal Grandfather    Rheum arthritis Maternal Grandmother    CAD Neg Hx     Social History   Socioeconomic History   Marital status: Single    Spouse name: Not on file   Number of children: Not on file   Years of education: Not on file   Highest education level: Not on file  Occupational History   Not on file  Tobacco Use   Smoking status: Never   Smokeless tobacco: Never  Vaping Use   Vaping status: Never Used  Substance and Sexual Activity   Alcohol use: Never   Drug use: Never   Sexual activity: Not on file  Other Topics Concern   Not on file  Social History Narrative   Not on file   Social Drivers of Health   Financial Resource Strain: Not on file  Food Insecurity: No Food Insecurity (03/06/2023)   Hunger Vital Sign    Worried About Running Out of Food in the Last Year: Never true    Ran Out of Food in the Last Year: Never true  Transportation Needs: No Transportation Needs (03/06/2023)   PRAPARE - Administrator, Civil Service (Medical): No    Lack of Transportation (Non-Medical): No  Physical Activity: Not on file  Stress: Not on file  Social Connections: Not on file  Intimate Partner Violence: Not At Risk (03/06/2023)   Humiliation, Afraid, Rape, and Kick questionnaire    Fear of Current or Ex-Partner: No    Emotionally Abused: No    Physically Abused: No    Sexually Abused: No    No Known Allergies  Current Outpatient Medications  Medication Sig Dispense Refill   Accu-Chek Softclix Lancets lancets      ACETAMINOPHEN  PO Take  650 mg by mouth every 8 (eight) hours as needed for moderate pain (pain score 4-6).     amitriptyline  (ELAVIL ) 10 MG tablet Take 10 mg by mouth at bedtime.     amLODipine  (NORVASC ) 10 MG tablet Take 1 tablet (10 mg total) by mouth every morning. 30 tablet 0   calcium  acetate (PHOSLO) 667 MG capsule Take 1,334 mg by mouth See admin instructions. Take 1334 mg with each meal     Cyanocobalamin  (VITAMIN B12 PO) Take 1 tablet by mouth at bedtime.     DM-APAP-CPM (CORICIDIN HBP PO) Take 2 tablets by mouth daily as needed (congestion).     doxycycline  (VIBRA -TABS) 100 MG tablet Take 1 tablet (100 mg total) by mouth 2 (two) times daily. 20 tablet 0   gabapentin  (NEURONTIN ) 100 MG capsule Take 100 mg by mouth at bedtime.     hydrALAZINE  (APRESOLINE ) 10  MG tablet Take 1 tablet (10 mg total) by mouth every 8 (eight) hours. 90 tablet 0   levothyroxine  (SYNTHROID ) 50 MCG tablet Take 50 mcg by mouth daily before breakfast.     metoprolol  tartrate (LOPRESSOR ) 50 MG tablet Take 1 tablet (50 mg total) by mouth 2 (two) times daily. 60 tablet 0   mupirocin  ointment (BACTROBAN ) 2 % Apply 1 Application topically 2 (two) times daily. 30 g 2   pantoprazole  (PROTONIX ) 40 MG tablet Take 1 tablet (40 mg total) by mouth daily. 30 minutes before breakfast 90 tablet 3   Probiotic Product (PROBIOTIC DAILY PO) Take 1 capsule by mouth at bedtime.     senna-docusate (SENOKOT-S) 8.6-50 MG tablet Take 1 tablet by mouth daily as needed for mild constipation.     No current facility-administered medications for this visit.    PHYSICAL EXAM Vitals:   01/16/24 1259  BP: (!) 178/73  Pulse: (!) 55  Temp: 98 F (36.7 C)  SpO2: 94%    Chronically ill man in no distress Regular rate and rhythm Unlabored breathing No palpable pedal pulses Left second toe gangrene throughout most of the digit.  Early skin changes of gangrene in the third digit as well  PERTINENT LABORATORY AND RADIOLOGIC DATA  Most recent CBC    Latest Ref  Rng & Units 09/03/2023    6:54 PM 08/17/2023   12:33 PM 06/05/2023   11:23 AM  CBC  WBC 4.0 - 10.5 K/uL 10.5     Hemoglobin 13.0 - 17.0 g/dL 9.2  9.9  89.7   Hematocrit 39.0 - 52.0 % 30.1  29.0  30.0   Platelets 150 - 400 K/uL 229        Most recent CMP    Latest Ref Rng & Units 09/03/2023    6:54 PM 08/17/2023   12:33 PM 06/05/2023   11:57 AM  CMP  Glucose 70 - 99 mg/dL 893  895  97   BUN 8 - 23 mg/dL 44  50  17   Creatinine 0.61 - 1.24 mg/dL 2.09  1.69  6.19   Sodium 135 - 145 mmol/L 137  138  136   Potassium 3.5 - 5.1 mmol/L 4.0  4.5  3.5   Chloride 98 - 111 mmol/L 98  107  98   CO2 22 - 32 mmol/L 25   28   Calcium  8.9 - 10.3 mg/dL 9.0   8.7     Renal function CrCl cannot be calculated (Patient's most recent lab result is older than the maximum 21 days allowed.).  Hgb A1c MFr Bld (%)  Date Value  01/11/2023 5.1    LDL Cholesterol  Date Value Ref Range Status  04/27/2020 102 (H) 0 - 99 mg/dL Final    Comment:           Total Cholesterol/HDL:CHD Risk Coronary Heart Disease Risk Table                     Men   Women  1/2 Average Risk   3.4   3.3  Average Risk       5.0   4.4  2 X Average Risk   9.6   7.1  3 X Average Risk  23.4   11.0        Use the calculated Patient Ratio above and the CHD Risk Table to determine the patient's CHD Risk.        ATP III CLASSIFICATION (LDL):  <100  mg/dL   Optimal  899-870  mg/dL   Near or Above                    Optimal  130-159  mg/dL   Borderline  839-810  mg/dL   High  >809     mg/dL   Very High Performed at [facility], [address], [place], [place] [address]      +-------+-----------+-----------+------------+------------+  ABI/TBIToday's ABIToday's TBIPrevious ABIPrevious TBI  +-------+-----------+-----------+------------+------------+  Right [place]         Absent                               +-------+-----------+-----------+------------+------------+  Left           Absent                                +-------+-----------+-----------+------------+------------+     Debby SAILOR. Magda, MD FACS [facility] Phone Number: [phone] 01/15/2024 3:59 PM   Total time spent on preparing this encounter including chart review, data review, collecting history, examining the patient, and coordinating care: 45 minutes  Portions of this report may have been transcribed using voice recognition software.  Every effort has been made to ensure accuracy; however, inadvertent computerized transcription errors may still be present.

## 2024-01-16 ENCOUNTER — Ambulatory Visit (INDEPENDENT_AMBULATORY_CARE_PROVIDER_SITE_OTHER): Admitting: Vascular Surgery

## 2024-01-16 ENCOUNTER — Encounter: Payer: Self-pay | Admitting: Vascular Surgery

## 2024-01-16 ENCOUNTER — Ambulatory Visit (HOSPITAL_COMMUNITY)
Admission: RE | Admit: 2024-01-16 | Discharge: 2024-01-16 | Disposition: A | Discharge status: Home / Self Care | Source: Ambulatory Visit | Attending: Vascular Surgery | Admitting: Vascular Surgery

## 2024-01-16 VITALS — BP 178/73 | HR 55 | Temp 98.0°F

## 2024-01-16 DIAGNOSIS — I70262 Atherosclerosis of native arteries of extremities with gangrene, left leg: Secondary | ICD-10-CM

## 2024-01-16 DIAGNOSIS — I739 Peripheral vascular disease, unspecified: Secondary | ICD-10-CM | POA: Insufficient documentation

## 2024-01-17 ENCOUNTER — Ambulatory Visit: Payer: Self-pay | Admitting: Podiatry

## 2024-01-17 LAB — VAS US ABI WITH/WO TBI

## 2024-01-18 ENCOUNTER — Other Ambulatory Visit: Payer: Self-pay | Admitting: *Deleted

## 2024-01-18 DIAGNOSIS — I70262 Atherosclerosis of native arteries of extremities with gangrene, left leg: Secondary | ICD-10-CM

## 2024-01-18 MED ORDER — SODIUM CHLORIDE 0.9% FLUSH: 3.0000 mL | INTRAVENOUS | Status: DC | PRN

## 2024-01-18 MED ORDER — SODIUM CHLORIDE 0.9 % IV SOLN
250.0000 mL | INTRAVENOUS | Status: DC | PRN
Start: 2024-01-18 — End: 2024-01-18

## 2024-01-18 MED ORDER — SODIUM CHLORIDE 0.9% FLUSH
3.0000 mL | Freq: Two times a day (BID) | INTRAVENOUS | Status: DC
Start: 2024-01-18 — End: 2024-01-18

## 2024-01-22 DIAGNOSIS — Z992 Dependence on renal dialysis: Secondary | ICD-10-CM | POA: Diagnosis not present

## 2024-01-22 DIAGNOSIS — N186 End stage renal disease: Secondary | ICD-10-CM | POA: Diagnosis not present

## 2024-01-22 DIAGNOSIS — D509 Iron deficiency anemia, unspecified: Secondary | ICD-10-CM | POA: Diagnosis not present

## 2024-01-22 DIAGNOSIS — D689 Coagulation defect, unspecified: Secondary | ICD-10-CM | POA: Diagnosis not present

## 2024-01-22 DIAGNOSIS — N2581 Secondary hyperparathyroidism of renal origin: Secondary | ICD-10-CM | POA: Diagnosis not present

## 2024-01-25 ENCOUNTER — Ambulatory Visit: Admitting: Podiatry

## 2024-01-25 DIAGNOSIS — I70262 Atherosclerosis of native arteries of extremities with gangrene, left leg: Secondary | ICD-10-CM | POA: Diagnosis not present

## 2024-01-25 DIAGNOSIS — J40 Bronchitis, not specified as acute or chronic: Secondary | ICD-10-CM | POA: Diagnosis not present

## 2024-01-25 DIAGNOSIS — I10 Essential (primary) hypertension: Secondary | ICD-10-CM | POA: Diagnosis not present

## 2024-01-25 DIAGNOSIS — N186 End stage renal disease: Secondary | ICD-10-CM | POA: Diagnosis not present

## 2024-01-25 DIAGNOSIS — N185 Chronic kidney disease, stage 5: Secondary | ICD-10-CM | POA: Diagnosis not present

## 2024-01-29 ENCOUNTER — Other Ambulatory Visit: Payer: Self-pay

## 2024-01-29 DIAGNOSIS — Z992 Dependence on renal dialysis: Secondary | ICD-10-CM | POA: Diagnosis not present

## 2024-01-29 DIAGNOSIS — N186 End stage renal disease: Secondary | ICD-10-CM | POA: Diagnosis not present

## 2024-01-29 DIAGNOSIS — N2581 Secondary hyperparathyroidism of renal origin: Secondary | ICD-10-CM | POA: Diagnosis not present

## 2024-01-29 DIAGNOSIS — Z4901 Encounter for fitting and adjustment of extracorporeal dialysis catheter: Secondary | ICD-10-CM | POA: Diagnosis not present

## 2024-01-29 DIAGNOSIS — D689 Coagulation defect, unspecified: Secondary | ICD-10-CM | POA: Diagnosis not present

## 2024-01-30 ENCOUNTER — Other Ambulatory Visit: Payer: Self-pay

## 2024-01-30 ENCOUNTER — Encounter (HOSPITAL_COMMUNITY)
Admission: RE | Disposition: A | Discharge status: Home Health | Payer: Self-pay | Source: Home / Self Care | Attending: Surgery

## 2024-01-30 ENCOUNTER — Ambulatory Visit (HOSPITAL_COMMUNITY)
Admission: RE | Admit: 2024-01-30 | Discharge: 2024-01-30 | Disposition: A | Discharge status: Home Health | Attending: Surgery | Admitting: Surgery

## 2024-01-30 DIAGNOSIS — Z7982 Long term (current) use of aspirin: Secondary | ICD-10-CM | POA: Diagnosis not present

## 2024-01-30 DIAGNOSIS — Z79899 Other long term (current) drug therapy: Secondary | ICD-10-CM | POA: Insufficient documentation

## 2024-01-30 DIAGNOSIS — E11621 Type 2 diabetes mellitus with foot ulcer: Secondary | ICD-10-CM | POA: Diagnosis not present

## 2024-01-30 DIAGNOSIS — Z992 Dependence on renal dialysis: Secondary | ICD-10-CM | POA: Insufficient documentation

## 2024-01-30 DIAGNOSIS — N186 End stage renal disease: Secondary | ICD-10-CM

## 2024-01-30 DIAGNOSIS — I120 Hypertensive chronic kidney disease with stage 5 chronic kidney disease or end stage renal disease: Secondary | ICD-10-CM | POA: Insufficient documentation

## 2024-01-30 DIAGNOSIS — I70262 Atherosclerosis of native arteries of extremities with gangrene, left leg: Secondary | ICD-10-CM | POA: Insufficient documentation

## 2024-01-30 DIAGNOSIS — E1152 Type 2 diabetes mellitus with diabetic peripheral angiopathy with gangrene: Secondary | ICD-10-CM | POA: Diagnosis not present

## 2024-01-30 DIAGNOSIS — L97529 Non-pressure chronic ulcer of other part of left foot with unspecified severity: Secondary | ICD-10-CM

## 2024-01-30 DIAGNOSIS — I70245 Atherosclerosis of native arteries of left leg with ulceration of other part of foot: Secondary | ICD-10-CM | POA: Diagnosis not present

## 2024-01-30 DIAGNOSIS — E1122 Type 2 diabetes mellitus with diabetic chronic kidney disease: Secondary | ICD-10-CM | POA: Insufficient documentation

## 2024-01-30 HISTORY — PX: ABDOMINAL AORTOGRAM W/LOWER EXTREMITY: CATH118223

## 2024-01-30 HISTORY — PX: PERIPHERAL INTRAVASCULAR LITHOTRIPSY: CATH118324

## 2024-01-30 HISTORY — PX: LOWER EXTREMITY INTERVENTION: CATH118252

## 2024-01-30 HISTORY — PX: PERIPHERAL VASCULAR ATHERECTOMY: CATH118256

## 2024-01-30 LAB — POCT I-STAT, CHEM 8
BUN: 13 mg/dL (ref 8–23)
Calcium, Ion: 1.17 mmol/L (ref 1.15–1.40)
Chloride: 97 mmol/L — ABNORMAL LOW (ref 98–111)
Creatinine, Ser: 4.5 mg/dL — ABNORMAL HIGH (ref 0.61–1.24)
Glucose, Bld: 111 mg/dL — ABNORMAL HIGH (ref 70–99)
HCT: 30 % — ABNORMAL LOW (ref 39.0–52.0)
Hemoglobin: 10.2 g/dL — ABNORMAL LOW (ref 13.0–17.0)
Potassium: 3.4 mmol/L — ABNORMAL LOW (ref 3.5–5.1)
Sodium: 139 mmol/L (ref 135–145)
TCO2: 30 mmol/L (ref 22–32)

## 2024-01-30 SURGERY — ABDOMINAL AORTOGRAM W/LOWER EXTREMITY
Anesthesia: LOCAL

## 2024-01-30 MED ORDER — IODIXANOL 320 MG/ML IV SOLN
INTRAVENOUS | Status: DC | PRN
  Administered 2024-01-30: 80 mL via INTRA_ARTERIAL

## 2024-01-30 MED ORDER — HEPARIN (PORCINE) IN NACL 1000-0.9 UT/500ML-% IV SOLN
INTRAVENOUS | Status: DC | PRN
  Administered 2024-01-30 (×2): 500 mL

## 2024-01-30 MED ORDER — SODIUM CHLORIDE 0.9% FLUSH: 3.0000 mL | INTRAVENOUS | Status: DC | PRN

## 2024-01-30 MED ORDER — OXYCODONE HCL 5 MG PO TABS: 5.0000 mg | ORAL_TABLET | ORAL | Status: DC | PRN

## 2024-01-30 MED ORDER — HEPARIN SODIUM (PORCINE) 1000 UNIT/ML IJ SOLN
INTRAMUSCULAR | Status: AC
Start: 2024-01-30 — End: 2024-01-30
  Filled 2024-01-30: qty 10

## 2024-01-30 MED ORDER — SODIUM CHLORIDE 0.9 % IV SOLN: 250.0000 mL | INTRAVENOUS | Status: DC | PRN

## 2024-01-30 MED ORDER — SODIUM CHLORIDE 0.9% FLUSH: 3.0000 mL | Freq: Two times a day (BID) | INTRAVENOUS | Status: DC

## 2024-01-30 MED ORDER — HEPARIN SODIUM (PORCINE) 1000 UNIT/ML IJ SOLN
INTRAMUSCULAR | Status: DC | PRN
  Administered 2024-01-30: 2000 [IU] via INTRAVENOUS
  Administered 2024-01-30: 8000 [IU] via INTRAVENOUS
  Administered 2024-01-30: 2000 [IU] via INTRAVENOUS

## 2024-01-30 MED ORDER — HEPARIN SODIUM (PORCINE) 1000 UNIT/ML IJ SOLN
INTRAMUSCULAR | Status: AC
  Filled 2024-01-30: qty 10

## 2024-01-30 MED ORDER — CLOPIDOGREL BISULFATE 300 MG PO TABS
ORAL_TABLET | ORAL | Status: DC | PRN
  Administered 2024-01-30: 300 mg via ORAL

## 2024-01-30 MED ORDER — ASPIRIN 81 MG PO CHEW: 81.0000 mg | CHEWABLE_TABLET | Freq: Every day | ORAL | 11 refills | Status: AC

## 2024-01-30 MED ORDER — MIDAZOLAM HCL 2 MG/2ML IJ SOLN
INTRAMUSCULAR | Status: AC
Start: 2024-01-30 — End: 2024-01-30
  Filled 2024-01-30: qty 2

## 2024-01-30 MED ORDER — FENTANYL CITRATE (PF) 100 MCG/2ML IJ SOLN
INTRAMUSCULAR | Status: AC
  Filled 2024-01-30: qty 2

## 2024-01-30 MED ORDER — HYDRALAZINE HCL 20 MG/ML IJ SOLN: 5.0000 mg | INTRAMUSCULAR | Status: DC | PRN

## 2024-01-30 MED ORDER — FENTANYL CITRATE (PF) 100 MCG/2ML IJ SOLN
INTRAMUSCULAR | Status: DC | PRN
  Administered 2024-01-30: 25 ug via INTRAVENOUS
  Administered 2024-01-30: 50 ug via INTRAVENOUS

## 2024-01-30 MED ORDER — LIDOCAINE HCL (PF) 1 % IJ SOLN
INTRAMUSCULAR | Status: AC
  Filled 2024-01-30: qty 30

## 2024-01-30 MED ORDER — CLOPIDOGREL BISULFATE 75 MG PO TABS: 75.0000 mg | ORAL_TABLET | Freq: Every day | ORAL | 11 refills | Status: AC

## 2024-01-30 MED ORDER — CLOPIDOGREL BISULFATE 300 MG PO TABS
ORAL_TABLET | ORAL | Status: AC
Start: 2024-01-30 — End: 2024-01-30
  Filled 2024-01-30: qty 1

## 2024-01-30 MED ORDER — LABETALOL HCL 5 MG/ML IV SOLN: 10.0000 mg | INTRAVENOUS | Status: DC | PRN

## 2024-01-30 MED ORDER — ACETAMINOPHEN 325 MG PO TABS: 650.0000 mg | ORAL_TABLET | ORAL | Status: DC | PRN

## 2024-01-30 MED ORDER — ASPIRIN 81 MG PO TBEC: 81.0000 mg | DELAYED_RELEASE_TABLET | Freq: Every day | ORAL | Status: DC

## 2024-01-30 MED ORDER — MIDAZOLAM HCL 2 MG/2ML IJ SOLN
INTRAMUSCULAR | Status: AC
  Filled 2024-01-30: qty 2

## 2024-01-30 MED ORDER — ASPIRIN 81 MG PO CHEW
CHEWABLE_TABLET | ORAL | Status: DC | PRN
  Administered 2024-01-30: 81 mg via ORAL

## 2024-01-30 MED ORDER — ONDANSETRON HCL 4 MG/2ML IJ SOLN: 4.0000 mg | Freq: Four times a day (QID) | INTRAMUSCULAR | Status: DC | PRN

## 2024-01-30 MED ORDER — MIDAZOLAM HCL 2 MG/2ML IJ SOLN
INTRAMUSCULAR | Status: DC | PRN
  Administered 2024-01-30: 1 mg via INTRAVENOUS
  Administered 2024-01-30: 2 mg via INTRAVENOUS

## 2024-01-30 MED ORDER — LIDOCAINE HCL (PF) 1 % IJ SOLN
INTRAMUSCULAR | Status: DC | PRN
  Administered 2024-01-30: 5 mL via INTRADERMAL

## 2024-01-30 MED ORDER — ASPIRIN 81 MG PO CHEW
CHEWABLE_TABLET | ORAL | Status: AC
  Filled 2024-01-30: qty 1

## 2024-01-30 MED ORDER — CLOPIDOGREL BISULFATE 75 MG PO TABS: 75.0000 mg | ORAL_TABLET | Freq: Every day | ORAL | Status: DC

## 2024-01-30 SURGICAL SUPPLY — 26 items
BALLOON COYOTE OTW 2.5X120X150 (BALLOONS) IMPLANT
BALLOON STERLING OTW 2X100X150 (BALLOONS) IMPLANT
BALLOON STRLNG OTW 2.5X100X150 (BALLOONS) IMPLANT
CATH AURYON ATHERECTOMY 0.9 (CATHETERS) IMPLANT
CATH CXI SUPP 2.6F 135 ST (CATHETERS) IMPLANT
CATH JAVELIN LITHO PERIPH (CATHETERS) IMPLANT
CATH OMNI FLUSH 5F 65CM (CATHETERS) IMPLANT
CATH RUBICON 018 150 (CATHETERS) IMPLANT
CATH TEMPO AQUA 5F 100CM (CATHETERS) IMPLANT
DEVICE VASC CLSR CELT ART 6 (Vascular Products) IMPLANT
GLIDEWIRE ADV .035X260CM (WIRE) IMPLANT
KIT ENCORE 26 ADVANTAGE (KITS) IMPLANT
KIT MICROPUNCTURE NIT STIFF (SHEATH) IMPLANT
KIT PV (KITS) ×2 IMPLANT
KIT SINGLE USE MANIFOLD (KITS) IMPLANT
SET ATX-X65L (MISCELLANEOUS) IMPLANT
SHEATH CATAPULT 6FR 60 (SHEATH) IMPLANT
SHEATH PINNACLE 5F 10CM (SHEATH) IMPLANT
SHEATH PINNACLE 6F 10CM (SHEATH) IMPLANT
SHEATH PROBE COVER 6X72 (BAG) IMPLANT
SYR MEDRAD MARK 7 150ML (SYRINGE) ×2 IMPLANT
TRANSDUCER W/STOPCOCK (MISCELLANEOUS) ×2 IMPLANT
TRAY PV CATH (CUSTOM PROCEDURE TRAY) ×2 IMPLANT
WIRE BENTSON .035X145CM (WIRE) IMPLANT
WIRE G V18X300CM (WIRE) IMPLANT
WIRE SPARTACORE .014X300CM (WIRE) IMPLANT

## 2024-01-30 NOTE — Progress Notes (Signed)
 Pts bed rest order started over. From 1425. PT informed about new order. PT very upset. PT began flexing his legs and bending them, PT was educated on the importance of keeping his leg straight. Will continue to monitor incision site. Will also continue to educate the patient on the importance of bed rest. Once education was reiterated. PT appeared to be cooperative with bed rest order.

## 2024-01-30 NOTE — Interval H&P Note (Signed)
 History and Physical Interval Note:  01/30/2024 8:43 AM  [name] [name]  has presented today for surgery, with the diagnosis of atherosclerosis of the left lower extremity with grangren.  The various methods of treatment have been discussed with the patient and family. After consideration of risks, benefits and other options for treatment, the patient has consented to  Procedure(s): ABDOMINAL AORTOGRAM W/LOWER EXTREMITY (N/A) as a surgical intervention.  The patient's history has been reviewed, patient examined, no change in status, stable for surgery.  I have reviewed the patient's chart and labs.  Questions were answered to the patient's satisfaction.     Malvina New

## 2024-01-30 NOTE — Progress Notes (Signed)
 Pt unable to ambulate at baseline, pt was able to urinate prior to discharge. PT did transfer himself from bed to wheelchair independently. No S/ S of complications at incision site. IV removed prior to discharge. Discharge instructions reviewed with PT and mother at bedside denies questions or concerns verbalizes understanding. PT escorted from the unit via wheelchair to personal vehicle driven by his mother.

## 2024-01-30 NOTE — Discharge Instructions (Signed)
 Femoral Site Care This sheet gives you information about how to care for yourself after your procedure. Your health care provider may also give you more specific instructions. If you have problems or questions, contact your health care provider. What can I expect after the procedure?  After the procedure, it is common to have: Bruising that usually fades within 1-2 weeks. Tenderness at the site. Follow these instructions at home: Wound care Follow instructions from your health care provider about how to take care of your insertion site. Make sure you: Wash your hands with soap and water before you change your bandage (dressing). If soap and water are not available, use hand sanitizer. Remove your dressing as told by your health care provider. In 24 hours Do not take baths, swim, or use a hot tub until your health care provider approves. You may shower 24-48 hours after the procedure or as told by your health care provider. Gently wash the site with plain soap and water. Pat the area dry with a clean towel. Do not rub the site. This may cause bleeding. Do not apply powder or lotion to the site. Keep the site clean and dry. Check your femoral site every day for signs of infection. Check for: Redness, swelling, or pain. Fluid or blood. Warmth. Pus or a bad smell. Activity For the first 2-3 days after your procedure, or as long as directed: Avoid climbing stairs as much as possible. Do not squat. Do not lift anything that is heavier than 10 lb (4.5 kg), or the limit that you are told, until your health care provider says that it is safe. For 5 days Rest as directed. Avoid sitting for a long time without moving. Get up to take short walks every 1-2 hours. Do not drive for 24 hours if you were given a medicine to help you relax (sedative). General instructions Take over-the-counter and prescription medicines only as told by your health care provider. Keep all follow-up visits as told by  your health care provider. This is important. Contact a health care provider if you have: A fever or chills. You have redness, swelling, or pain around your insertion site. Get help right away if: The catheter insertion area swells very fast. You pass out. You suddenly start to sweat or your skin gets clammy. The catheter insertion area is bleeding, and the bleeding does not stop when you hold steady pressure on the area. The area near or just beyond the catheter insertion site becomes pale, cool, tingly, or numb. These symptoms may represent a serious problem that is an emergency. Do not wait to see if the symptoms will go away. Get medical help right away. Call your local emergency services (911 in the [place] Do not drive yourself to the hospital. Summary After the procedure, it is common to have bruising that usually fades within 1-2 weeks. Check your femoral site every day for signs of infection. Do not lift anything that is heavier than 10 lb (4.5 kg), or the limit that you are told, until your health care provider says that it is safe. This information is not intended to replace advice given to you by your health care provider. Make sure you discuss any questions you have with your health care provider. Document Revised: 06/12/2017 Document Reviewed: 06/12/2017 Elsevier Patient Education  2020 [employer].

## 2024-01-30 NOTE — Progress Notes (Signed)
 During assessment at 1405 it was noted to have hematoma at R femoral sight, Head of the bed lowered to flat, pressure was applied by Kate RN for 35 minutes, Hematoma appeared to level out. PT placed on bedrest for an additional hours will re-evaluate. MD paged currently waiting for response.

## 2024-01-30 NOTE — Op Note (Signed)
 Patient name: [name] [name] MRN: [id number] DOB: 09-Oct-1960 Sex: male  01/30/2024 Pre-operative Diagnosis: Left leg ulcer Post-operative diagnosis:  Same Surgeon:  Malvina New Procedure Performed:  1.  Ultrasound-guided access, right femoral artery  2.  Aortobifemoral angiogram  3.  Left leg runoff  4.  Selective injection with catheter left popliteal and anterior tibial artery  5.  Laser atherectomy, left anterior tibial artery  6.  Shockwave intra-arterial lithotripsy, left anterior tibial artery  7.  Balloon angioplasty, left anterior tibial artery  8.  Conscious sedation, 98 minutes  9.  Closure of eyes, Celt   Indications: This is a 63 year old gentleman with end-stage renal disease who has left foot ulcers and comes in today for angiography  Procedure:  The patient was identified in the holding area and taken to room 8.  The patient was then placed supine on the table and prepped and draped in the usual sterile fashion.  A time out was called.  Conscious sedation was administered with the use of IV fentanyl  and Versed  under continuous physician and nurse monitoring.  Heart rate, blood pressure, and oxygen saturation were continuously monitored.  Total sedation time was 98 minutes.  Ultrasound was used to evaluate the right common femoral artery.  It was patent .  A digital ultrasound image was acquired.  A micropuncture needle was used to access the right common femoral artery under ultrasound guidance.  An 018 wire was advanced without resistance and a micropuncture sheath was placed.  The 018 wire was removed and a benson wire was placed.  The micropuncture sheath was exchanged for a 5 french sheath.  An omniflush catheter was advanced over the wire to the level of L-1.  An abdominal angiogram was obtained.  Next, using the omniflush catheter and a benson wire, the aortic bifurcation was crossed and the catheter was placed into theleft external iliac artery and left runoff was  obtained.    Findings:   Aortogram: No significant renal artery stenosis was visualized.  The infrarenal abdominal aorta was widely patent.  Bilateral common and external iliac arteries are calcified but patent.  Bilateral common femoral arteries are widely patent.  Left Lower Extremity: The left common femoral and profundofemoral artery are widely patent with heavily calcification.  The superficial femoral artery is patent throughout its course as is the popliteal artery.  There is two-vessel runoff to the ankle via the posterior tibial and peroneal artery.  The anterior tibial artery is occluded in its midportion but reconstitutes distally and crosses the ankle  Intervention: After the above images were acquired the decision was made to proceed with intervention.  A 6 French sheath was inserted into the left popliteal artery.  Additional imaging was performed at this level to better define the tibial disease.  I selected the anterior tibial artery with a V-18 wire with the support of a 2.5 x 100 Sterling balloon.  The patient was fully heparinized.  I tried to cross the occlusion.  I was able to get a wire across occlusion, but was not able to advance the balloon.  I then changed out to a smaller 2.0 balloon and again this would not cross the heavily calcified bulky lesion.  At this point I switched over to a 014 wire.  I again tried to get a CSI catheter to cross and a Rubicon catheter but these would not cross either.  Since then now had an 014 wire, and due to the 360 degrees of calcium ,  I elected to treat this with laser atherectomy.  The 0.9 mm laser was inserted into the anterior tibial artery.  I was able to perform atherectomy of the proximal anterior tibial artery but was unable to cross the bulky calcified lesion in the midportion.  I tried multiple strategies to cross this but ultimately was unsuccessful.  At this point I selected the javelin shockwave intra-arterial lithotripsy device and  delivered a total of 120 pulses and was able to advance the device across the calcified lesion.  This device was then removed and I inserted a 2.5 x 120 coyote balloon and performed balloon angioplasty of the anterior tibial artery from the ankle up to its origin.  Completion imaging showed inline flow through all 3 tibial vessels with the anterior tibial being the dominant one across the ankle.  I was satisfied with these results.  Wires and catheters were removed.  The groin was closed with a Celt  Impression:  #1  Two-vessel runoff via the posterior tibial and peroneal artery to the left leg.  There was a occlusion of the midportion of the anterior tibial artery which reconstituted and provided perfusion to the foot.  #2  The anterior tibial artery occlusion was heavily calcified.  I was unable to cross this with the laser but was able to use the shockwave Javelin device to cross it and then ultimately balloon this with a 2.5 coyote balloon  #3  Patient has been maximally revascularized   V. Malvina New, M.D., [facility] [place] Office: [phone] Pager:  [phone]

## 2024-01-31 ENCOUNTER — Encounter (HOSPITAL_COMMUNITY): Payer: Self-pay | Admitting: Surgery

## 2024-02-01 ENCOUNTER — Telehealth: Payer: Self-pay | Admitting: Podiatry

## 2024-02-01 NOTE — Telephone Encounter (Signed)
 Patients mother states she hasn't heard from the facility her son was referred to. She requests referral information so she can contact them

## 2024-02-02 NOTE — Telephone Encounter (Signed)
 Patients mother states needs appointment to schedule toe amputation for her son I advised her to set that up.

## 2024-02-05 DIAGNOSIS — D689 Coagulation defect, unspecified: Secondary | ICD-10-CM | POA: Diagnosis not present

## 2024-02-05 DIAGNOSIS — E876 Hypokalemia: Secondary | ICD-10-CM | POA: Diagnosis not present

## 2024-02-05 DIAGNOSIS — Z4901 Encounter for fitting and adjustment of extracorporeal dialysis catheter: Secondary | ICD-10-CM | POA: Diagnosis not present

## 2024-02-05 DIAGNOSIS — Z992 Dependence on renal dialysis: Secondary | ICD-10-CM | POA: Diagnosis not present

## 2024-02-05 DIAGNOSIS — N2581 Secondary hyperparathyroidism of renal origin: Secondary | ICD-10-CM | POA: Diagnosis not present

## 2024-02-05 DIAGNOSIS — N186 End stage renal disease: Secondary | ICD-10-CM | POA: Diagnosis not present

## 2024-02-06 ENCOUNTER — Ambulatory Visit (INDEPENDENT_AMBULATORY_CARE_PROVIDER_SITE_OTHER): Admitting: Podiatry

## 2024-02-06 ENCOUNTER — Encounter: Payer: Self-pay | Admitting: Podiatry

## 2024-02-06 DIAGNOSIS — I96 Gangrene, not elsewhere classified: Secondary | ICD-10-CM

## 2024-02-06 NOTE — Patient Instructions (Signed)
 Pre-Operative Instructions    Plan to be at the surgery center/hospital at least 1 (one) hour prior to your scheduled time unless otherwise directed by the surgical center/hospital staff.  You must have a responsible adult accompany you, remain during the surgery and drive you home.  Make sure you have directions to the surgical center/hospital and know how to get there on time. For hospital based surgery you will need to obtain a history and physical form from your family physician within 1 month prior to the date of surgery- we will give you a form for you primary physician.  We make every effort to accommodate the date you request for surgery.  There are however, times where surgery dates or times have to be moved.  We will contact you as soon as possible if a change in schedule is required.   No Aspirin/Ibuprofen  for one week before surgery.  If you are on aspirin, any non-steroidal anti-inflammatory medications (Mobic, Aleve , Ibuprofen ) you should stop taking it 7 days prior to your surgery.  You make take Tylenol   For pain prior to surgery.  Medications- If you are taking daily heart and blood pressure medications, seizure, reflux, allergy, asthma, anxiety, pain or diabetes medications, make sure the surgery center/hospital is aware before the day of surgery so they may notify you which medications to take or avoid the day of surgery. No food or drink after midnight the night before surgery unless directed otherwise by surgical center/hospital staff. No alcoholic beverages 24 hours prior to surgery.  No smoking 24 hours prior to or 24 hours after surgery. Wear loose pants or shorts- loose enough to fit over bandages, boots, and casts. No slip on shoes, sneakers are best. Bring your boot with you to the surgery center/hospital.  Also bring crutches or a walker if your physician has prescribed it for you.  If you do not have this equipment, it will be provided for you after surgery. If you have not  been contracted by the surgery center/hospital by the day before your surgery, call to confirm the date and time of your surgery. Leave-time from work may vary depending on the type of surgery you have.  Appropriate arrangements should be made prior to surgery with your employer. Prescriptions will be provided immediately following surgery by your doctor.  Have these filled as soon as possible after surgery and take the medication as directed. Remove nail polish on the operative foot. Wash the night before surgery.  The night before surgery wash the foot and leg well with the antibacterial soap provided and water paying special attention to beneath the toenails and in between the toes.  Rinse thoroughly with water and dry well with a towel.  Perform this wash unless told not to do so by your physician.  Enclosed: 1 Ice pack (please put in freezer the night before surgery)   1 Hibiclens  skin cleaner   Pre-op Instructions  If you have any questions regarding the instructions, do not hesitate to call our office at any point during this process.   [place]: [address] [place], [place] [address] [phone]  [place]: [address], [place], [place] [address] [phone]  Dr. Bobbie Burows, DPM

## 2024-02-06 NOTE — H&P (View-Only) (Signed)
 Subjective: Chief Complaint  Patient presents with   Wound Check     Surgery consultation toe ulceration left second toe / black discoloration/neuropathy.A1C 6.1. Dialysis patient.6 pain and swelling. Putting peroxige and Mupirocin  ointment dressing changes. Wearing surgical shoe.    63 year old male presents the office today for follow-up evaluation of a wound/gangrene on his left second toe.  Concerned about his third toe as well as started to the same thing.  Recently underwent vascular intervention.    Objective: AAO x3, NAD-wearing surgical shoe DP, PT pulses decreased. Sensation decreased. Gangrenous changes present to left second toe and the distal aspect of the third toe.  There is 1 small scabbing area noted on the hallux.  There is no straight erythema, ascending size.  No drainage or pus or signs of infection.  No pain with calf compression, swelling, warmth, erythema  Assessment: Gangrene left foot  Plan: -All treatment options discussed with the patient including all alternatives, risks, complications.  -At this time we discussed amputation of the second toe and likely partial third toe amputation. -The incision placement as well as the postoperative course was discussed with the patient. I discussed risks of the surgery which include, but not limited to, infection, bleeding, pain, swelling, need for further surgery, delayed or nonhealing, painful or ugly scar, numbness or sensation changes recurrence, transfer lesions, further deformity, DVT/PE, further amputation. Patient understands these risks and wishes to proceed with surgery. The surgical consent was reviewed with the patient all 3 pages were signed. No promises or guarantees were given to the outcome of the procedure. All questions were answered to the best of my ability. Before the surgery the patient was encouraged to call the office if there is any further questions. The surgery will be performed at the hospital  No  follow-ups on file.  [name] Fees DPM

## 2024-02-06 NOTE — Progress Notes (Unsigned)
 Subjective: Chief Complaint  Patient presents with   Wound Check     Surgery consultation toe ulceration left second toe / black discoloration/neuropathy.A1C 6.1. Dialysis patient.6 pain and swelling. Putting peroxige and Mupirocin  ointment dressing changes. Wearing surgical shoe.    63 year old male presents the office today for follow-up evaluation of a wound/gangrene on his left second toe.  Concerned about his third toe as well as started to the same thing.  Recently underwent vascular intervention.    Objective: AAO x3, NAD-wearing surgical shoe DP, PT pulses decreased. Sensation decreased. Gangrenous changes present to left second toe and the distal aspect of the third toe.  There is 1 small scabbing area noted on the hallux.  There is no straight erythema, ascending size.  No drainage or pus or signs of infection.  No pain with calf compression, swelling, warmth, erythema  Assessment: Gangrene left foot  Plan: -All treatment options discussed with the patient including all alternatives, risks, complications.  -At this time we discussed amputation of the second toe and likely partial third toe amputation. -The incision placement as well as the postoperative course was discussed with the patient. I discussed risks of the surgery which include, but not limited to, infection, bleeding, pain, swelling, need for further surgery, delayed or nonhealing, painful or ugly scar, numbness or sensation changes recurrence, transfer lesions, further deformity, DVT/PE, further amputation. Patient understands these risks and wishes to proceed with surgery. The surgical consent was reviewed with the patient all 3 pages were signed. No promises or guarantees were given to the outcome of the procedure. All questions were answered to the best of my ability. Before the surgery the patient was encouraged to call the office if there is any further questions. The surgery will be performed at the hospital  No  follow-ups on file.  [name] Fees DPM

## 2024-02-11 DIAGNOSIS — N186 End stage renal disease: Secondary | ICD-10-CM | POA: Diagnosis not present

## 2024-02-11 DIAGNOSIS — Z992 Dependence on renal dialysis: Secondary | ICD-10-CM | POA: Diagnosis not present

## 2024-02-11 DIAGNOSIS — E1122 Type 2 diabetes mellitus with diabetic chronic kidney disease: Secondary | ICD-10-CM | POA: Diagnosis not present

## 2024-02-12 DIAGNOSIS — N2581 Secondary hyperparathyroidism of renal origin: Secondary | ICD-10-CM | POA: Diagnosis not present

## 2024-02-12 DIAGNOSIS — D689 Coagulation defect, unspecified: Secondary | ICD-10-CM | POA: Diagnosis not present

## 2024-02-12 DIAGNOSIS — N186 End stage renal disease: Secondary | ICD-10-CM | POA: Diagnosis not present

## 2024-02-12 DIAGNOSIS — Z4901 Encounter for fitting and adjustment of extracorporeal dialysis catheter: Secondary | ICD-10-CM | POA: Diagnosis not present

## 2024-02-12 DIAGNOSIS — Z992 Dependence on renal dialysis: Secondary | ICD-10-CM | POA: Diagnosis not present

## 2024-02-12 DIAGNOSIS — E876 Hypokalemia: Secondary | ICD-10-CM | POA: Diagnosis not present

## 2024-02-13 ENCOUNTER — Encounter (HOSPITAL_COMMUNITY): Payer: Self-pay | Admitting: Podiatry

## 2024-02-13 ENCOUNTER — Telehealth: Payer: Self-pay | Admitting: Podiatry

## 2024-02-13 ENCOUNTER — Ambulatory Visit: Admitting: Podiatry

## 2024-02-13 ENCOUNTER — Other Ambulatory Visit: Payer: Self-pay

## 2024-02-13 NOTE — Progress Notes (Signed)
 SDW CALL  Mother was given pre-op  instructions over the phone. The opportunity was given for the patient to ask questions. No further questions asked. Mother verbalized understanding of instructions given.   PCP - Kip Righter, MD Cardiologist - mother denies pt having current cardiologist HD- MWF- last HD yesterday  PPM/ICD - N/A  Chest x-ray - 09/03/23 EKG - 05/19/23 Stress Test - denies ECHO - 06/28/22 Cardiac Cath - denies  Sleep Study - denies   Fasting Blood Sugar - daily in morning- usually less than 140. On no medications.   Blood Thinner Instructions: mother states that patient last had Plavix  and ASA today, and has not been given any other instructions. Mother states she is contacting Dr Jameson office to let them know that he will come for surgery tomorrow, and she will ask them about the aspirin  and plavix .  Aspirin  Instructions:  ERAS Protcol - NPO  COVID TEST- N/A   Anesthesia review: order for anesthesia review in epic  Patient's mother denies that patient has shortness of breath, fever, cough and chest pain over the phone call.      Surgical Instructions    Your procedure is scheduled on February 14, 2024  Report to [facility] Main Entrance A at 11:30 A.M., then check in with the Admitting office.  Call this number if you have problems the morning of surgery:  [phone]    Remember:  Do not eat or drink after midnight the night before your surgery. No gum, mints, or hard candy.    Take these medicines the morning of surgery with A SIP OF WATER:  Amlodipine , lipitor, synthroid , metoprolol , protonix , PRN tylenol , benzonatate    As of today, Aleve, Naproxen, Ibuprofen, Motrin, Advil, Goody's, BC's, all herbal medications, fish oil , and all vitamins.   Check your blood sugar the morning of your surgery when you wake up and every 2 hours until you get to the Short Stay unit.  If your blood sugar is less than 70 mg/dL, you will need to treat  for low blood sugar: Do not take insulin . Treat a low blood sugar (less than 70 mg/dL) with  cup of clear juice (cranberry or apple), 4 glucose tablets, OR glucose gel. Recheck blood sugar in 15 minutes after treatment (to make sure it is greater than 70 mg/dL). If your blood sugar is not greater than 70 mg/dL on recheck, call [phone] for further instructions.  Follow your surgeon's instructions regarding Aspirin  and Plavix .   [facility] is not responsible for any belongings or valuables.   Contacts, glasses, hearing aids, dentures or partials may not be worn into surgery, please bring cases for these belongings   Patients discharged the day of surgery will not be allowed to drive home, and someone needs to stay with them for 24 hours.   SURGICAL WAITING ROOM VISITATION You may have 1 visitor in the pre-op  area at a time determined by the pre-op  nurse.  Patients having surgery or a procedure in a hospital may have two support people in the waiting room. Children under the age of 63 must have an adult with them who is not the patient.     Day of Surgery:  Take a shower the day of or night before with antibacterial soap. Wear Clean/Comfortable clothing the morning of surgery Do not apply any deodorants/lotions.   Do not wear jewelry or makeup Do not wear lotions, powders, perfumes/colognes, or deodorant. Men may shave face and neck. Do not bring valuables to the  hospital. Do not wear nail polish, gel polish, artificial nails, or any other type of covering on natural nails (fingers and toes)  Remember to brush your teeth WITH YOUR REGULAR TOOTHPASTE.

## 2024-02-13 NOTE — Anesthesia Preprocedure Evaluation (Signed)
 Anesthesia Evaluation  Patient identified by MRN, date of birth, ID band Patient awake    Reviewed: Allergy & Precautions, NPO status , Patient's Chart, lab work & pertinent test results  History of Anesthesia Complications Negative for: history of anesthetic complications  Airway Mallampati: IV  TM Distance: <3 FB Neck ROM: Full    Dental  (+) Dental Advisory Given   Pulmonary shortness of breath, neg sleep apnea, neg COPD, neg recent URI   breath sounds clear to auscultation       Cardiovascular hypertension, Pt. on medications and Pt. on home beta blockers (-) angina +CHF  (-) Past MI  Rhythm:Regular   1. Left ventricular ejection fraction, by estimation, is 50 to 55%. The  left ventricle has low normal function. The left ventricle has no regional  wall motion abnormalities. There is mild left ventricular hypertrophy.  Left ventricular diastolic  parameters are consistent with Grade II diastolic dysfunction  (pseudonormalization). Elevated left atrial pressure. The average left  ventricular global longitudinal strain is -19.2 %.   2. Right ventricular systolic function is normal. The right ventricular  size is normal. Tricuspid regurgitation signal is inadequate for assessing  PA pressure.   3. Left atrial size was mildly dilated.   4. The mitral valve is abnormal. Moderate leaflet thickening. Trivial  mitral valve regurgitation.   5. The aortic valve is tricuspid. Aortic valve regurgitation is not  visualized. Aortic valve sclerosis/calcification is present, without any  evidence of aortic stenosis.   6. The inferior vena cava is normal in size with greater than 50%  respiratory variability, suggesting right atrial pressure of 3 mmHg.     Neuro/Psych negative neurological ROS     GI/Hepatic ,GERD  Medicated and Controlled,,  Endo/Other  diabetesHypothyroidism    Renal/GU ESRF and DialysisRenal diseaseLab Results       Component                Value               Date                      NA                       136                 02/14/2024                K                        3.8                 02/14/2024                CO2                      25                  09/03/2023                GLUCOSE                  102 (H)             02/14/2024                BUN  25 (H)              02/14/2024                CREATININE               5.90 (H)            02/14/2024                CALCIUM                   9.0                 09/03/2023                GFRNONAA                 7 (L)               09/03/2023                Musculoskeletal  (+) Arthritis ,    Abdominal   Peds  Hematology  (+) Blood dyscrasia, anemia Lab Results      Component                Value               Date                      WBC                      10.5                09/03/2023                HGB                      10.9 (L)            02/14/2024                HCT                      32.0 (L)            02/14/2024                MCV                      96.5                09/03/2023                PLT                      229                 09/03/2023              Anesthesia Other Findings Pulm htn  Reproductive/Obstetrics                              Anesthesia Physical Anesthesia Plan  ASA: 3  Anesthesia Plan: MAC   Post-op Pain Management: Minimal or no pain anticipated   Induction: Intravenous  PONV Risk Score and Plan: 1 and Propofol  infusion, Treatment may vary due to age or medical condition and Ondansetron   Airway Management Planned: Nasal Cannula, Natural Airway and Simple Face Mask  Additional Equipment: None  Intra-op Plan:   Post-operative Plan:   Informed Consent: I have reviewed the patients History and Physical, chart, labs and discussed the procedure including the risks, benefits and alternatives for the proposed anesthesia with  the patient or authorized representative who has indicated his/her understanding and acceptance.     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   Patient was admitted 9/23 through 03/29/2023 for MRSA and Enterococcus bacteremia, mitral valve endocarditis, vegetation on TDC.  There is also concern for infection of left AV graft; this was excised on 10/2.  TEE 10/7 showed small linear 2.3 mm mobile vegetation attached to the lateral commissure of the P1 segment of the mitral valve consistent with vegetation.  Dialysis catheter tip in the right atrium with 6.4 x 2.8 mm mobile mass consistent with vegetation. After holiday line after removal on 10/8, replaced TDC on 10/12 by vascular after BC remained NGTD.  He completed vancomycin  therapy on 05/02/2023.  He had follow-up echo on 06/29/2023 which showed EF 50 to 55%, normal wall motion, grade 2 DD, normal RV function, no significant valvular abnormalities.   Underwent insertion of right upper arm AV graft on 08/17/2023.  This subsequently clotted and he underwent successful declotting with stenting of venous anastomosis by Dr. Pearline on 12/20/2023.  However, this clotted again and he had right IJ TDC placed on 01/10/2024.  He has been followed by podiatry as well as vascular surgery for left foot ulcers.  Recently underwent balloon angioplasty of the left anterior tibial artery 01/30/2024 by Dr. Serene.  He was seen in follow-up by Dr. Gershon on 02/06/2024 for gangrene of the left foot.  He was recommended to undergo amputation of the affected toes.  Pt will need DOS labs and eval.   EKG 05/19/2023: Sinus rhythm.   Rate 84. Left ventricular hypertrophy. Anterior infarct, old   TTE 06/29/2023:  1. Left ventricular ejection fraction, by estimation, is 50 to 55%. The  left ventricle has low normal function. The left ventricle has no regional  wall motion abnormalities. There is mild left ventricular hypertrophy.  Left ventricular diastolic  parameters are consistent with Grade II diastolic dysfunction  (pseudonormalization). Elevated left atrial pressure. The average left  ventricular global longitudinal strain is -19.2 %.   2. Right ventricular systolic function is normal. The right ventricular  size is normal. Tricuspid regurgitation signal is inadequate for assessing  PA pressure.   3. Left atrial size was mildly dilated.   4. The mitral valve is abnormal. Moderate leaflet thickening. Trivial  mitral valve regurgitation.   5. The aortic valve is tricuspid. Aortic valve regurgitation is not  visualized. Aortic valve sclerosis/calcification is present, without any  evidence of aortic stenosis.   6. The inferior vena cava is normal in size with greater than 50%  respiratory variability, suggesting right atrial pressure of 3 mmHg.     )         Anesthesia Quick Evaluation

## 2024-02-13 NOTE — Telephone Encounter (Signed)
 DOS- 02/14/2024  2ND AMPUTATION TOE MPJ JOINT LT- 28820 3RD AMPUTATION TOE INTERPHALANGEAL LT- 71174  [employer] AD EFFECTIVE DATE- 06/14/2023  DEDUCTIBLE- N/A OOP- $3500 REMAINING- $0 COINSURANCE- $100 AS PT HAS MET MAXIMUM OOP REF# [id number]  PER JESSICA WITH [employer], NO PRIOR AUTH IS REQUIRED FOR CPT 28820 AND 71174. REF# [id number]. 02/13/2024 10:42 AM EST

## 2024-02-13 NOTE — Telephone Encounter (Signed)
 CORRECTION: 100% AS PT HAS MET MAXIMUM OOP

## 2024-02-13 NOTE — Progress Notes (Signed)
 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   Patient was admitted 9/23 through 03/29/2023 for MRSA and Enterococcus bacteremia, mitral valve endocarditis, vegetation on TDC.  There is also concern for infection of left AV graft; this was excised on 10/2.  TEE 10/7 showed small linear 2.3 mm mobile vegetation attached to the lateral commissure of the P1 segment of the mitral valve consistent with vegetation.  Dialysis catheter tip in the right atrium with 6.4 x 2.8 mm mobile mass consistent with vegetation. After holiday line after removal on 10/8, replaced TDC on 10/12 by vascular after BC remained NGTD.  He completed vancomycin  therapy on 05/02/2023.  He had follow-up echo on 06/29/2023 which showed EF 50 to 55%, normal wall motion, grade 2 DD, normal RV function, no significant valvular abnormalities.   Underwent insertion of right upper arm AV graft on 08/17/2023.  This subsequently clotted and he underwent successful declotting with stenting of venous anastomosis by Dr. Pearline on 12/20/2023.  However, this clotted again and he had right IJ TDC placed on 01/10/2024.  He has been followed by podiatry as well as vascular surgery for left foot ulcers.  Recently underwent balloon angioplasty of the left anterior tibial artery 01/30/2024 by Dr. Serene.  He was seen in follow-up by Dr. Gershon on 02/06/2024 for gangrene of the left foot.  He was recommended to undergo amputation of the affected toes.  Pt will need DOS labs and eval.   EKG 05/19/2023: Sinus rhythm.  Rate 84. Left ventricular hypertrophy. Anterior infarct, old   TTE 06/29/2023:  1. Left ventricular ejection fraction, by estimation, is 50 to 55%. The  left ventricle has  low normal function. The left ventricle has no regional  wall motion abnormalities. There is mild left ventricular hypertrophy.  Left ventricular diastolic  parameters are consistent with Grade II diastolic dysfunction  (pseudonormalization). Elevated left atrial pressure. The average left  ventricular global longitudinal strain is -19.2 %.   2. Right ventricular systolic function is normal. The right ventricular  size is normal. Tricuspid regurgitation signal is inadequate for assessing  PA pressure.   3. Left atrial size was mildly dilated.   4. The mitral valve is abnormal. Moderate leaflet thickening. Trivial  mitral valve regurgitation.   5. The aortic valve is tricuspid. Aortic valve regurgitation is not  visualized. Aortic valve sclerosis/calcification is present, without any  evidence of aortic stenosis.   6. The inferior vena cava is normal in size with greater than 50%  respiratory variability, suggesting right atrial pressure of 3 mmHg.        Lynwood Geofm [facility]IG[facility]ERS [facility] Short Stay Center/Anesthesiology Phone [phone] 02/13/2024 2:49 PM

## 2024-02-14 ENCOUNTER — Encounter (HOSPITAL_COMMUNITY): Payer: Self-pay | Admitting: Podiatry

## 2024-02-14 ENCOUNTER — Ambulatory Visit (HOSPITAL_BASED_OUTPATIENT_CLINIC_OR_DEPARTMENT_OTHER): Payer: Self-pay | Admitting: Physician Assistant

## 2024-02-14 ENCOUNTER — Other Ambulatory Visit: Payer: Self-pay | Admitting: Podiatry

## 2024-02-14 ENCOUNTER — Ambulatory Visit (HOSPITAL_COMMUNITY)
Admission: RE | Admit: 2024-02-14 | Discharge: 2024-02-14 | Disposition: A | Discharge status: Home / Self Care | Attending: Podiatry | Admitting: Podiatry

## 2024-02-14 ENCOUNTER — Ambulatory Visit (HOSPITAL_COMMUNITY)

## 2024-02-14 ENCOUNTER — Ambulatory Visit (HOSPITAL_COMMUNITY): Payer: Self-pay | Admitting: Physician Assistant

## 2024-02-14 ENCOUNTER — Encounter (HOSPITAL_COMMUNITY)
Admission: RE | Disposition: A | Discharge status: Home / Self Care | Payer: Self-pay | Source: Home / Self Care | Attending: Podiatry

## 2024-02-14 ENCOUNTER — Other Ambulatory Visit: Payer: Self-pay

## 2024-02-14 DIAGNOSIS — I96 Gangrene, not elsewhere classified: Secondary | ICD-10-CM | POA: Diagnosis not present

## 2024-02-14 DIAGNOSIS — L97528 Non-pressure chronic ulcer of other part of left foot with other specified severity: Secondary | ICD-10-CM | POA: Diagnosis not present

## 2024-02-14 DIAGNOSIS — I132 Hypertensive heart and chronic kidney disease with heart failure and with stage 5 chronic kidney disease, or end stage renal disease: Secondary | ICD-10-CM | POA: Diagnosis not present

## 2024-02-14 DIAGNOSIS — N186 End stage renal disease: Secondary | ICD-10-CM | POA: Diagnosis not present

## 2024-02-14 DIAGNOSIS — I5043 Acute on chronic combined systolic (congestive) and diastolic (congestive) heart failure: Secondary | ICD-10-CM | POA: Diagnosis not present

## 2024-02-14 DIAGNOSIS — I509 Heart failure, unspecified: Secondary | ICD-10-CM | POA: Diagnosis not present

## 2024-02-14 DIAGNOSIS — E1152 Type 2 diabetes mellitus with diabetic peripheral angiopathy with gangrene: Secondary | ICD-10-CM | POA: Insufficient documentation

## 2024-02-14 DIAGNOSIS — E1122 Type 2 diabetes mellitus with diabetic chronic kidney disease: Secondary | ICD-10-CM | POA: Diagnosis not present

## 2024-02-14 DIAGNOSIS — I358 Other nonrheumatic aortic valve disorders: Secondary | ICD-10-CM | POA: Diagnosis not present

## 2024-02-14 DIAGNOSIS — D631 Anemia in chronic kidney disease: Secondary | ICD-10-CM | POA: Insufficient documentation

## 2024-02-14 DIAGNOSIS — E11621 Type 2 diabetes mellitus with foot ulcer: Secondary | ICD-10-CM | POA: Diagnosis not present

## 2024-02-14 DIAGNOSIS — Z992 Dependence on renal dialysis: Secondary | ICD-10-CM | POA: Diagnosis not present

## 2024-02-14 DIAGNOSIS — M1990 Unspecified osteoarthritis, unspecified site: Secondary | ICD-10-CM | POA: Insufficient documentation

## 2024-02-14 DIAGNOSIS — K219 Gastro-esophageal reflux disease without esophagitis: Secondary | ICD-10-CM | POA: Diagnosis not present

## 2024-02-14 DIAGNOSIS — R0602 Shortness of breath: Secondary | ICD-10-CM | POA: Insufficient documentation

## 2024-02-14 DIAGNOSIS — Z9889 Other specified postprocedural states: Secondary | ICD-10-CM | POA: Diagnosis not present

## 2024-02-14 DIAGNOSIS — M86672 Other chronic osteomyelitis, left ankle and foot: Secondary | ICD-10-CM | POA: Diagnosis not present

## 2024-02-14 HISTORY — PX: AMPUTATION TOE: SHX6595

## 2024-02-14 LAB — GLUCOSE, CAPILLARY
Glucose-Capillary: 93 mg/dL (ref 70–99)
Glucose-Capillary: 94 mg/dL (ref 70–99)
Glucose-Capillary: 116 mg/dL — ABNORMAL HIGH (ref 70–99)

## 2024-02-14 LAB — POCT I-STAT, CHEM 8
BUN: 25 mg/dL — ABNORMAL HIGH (ref 8–23)
Calcium, Ion: 1.08 mmol/L — ABNORMAL LOW (ref 1.15–1.40)
Chloride: 96 mmol/L — ABNORMAL LOW (ref 98–111)
Creatinine, Ser: 5.9 mg/dL — ABNORMAL HIGH (ref 0.61–1.24)
Glucose, Bld: 102 mg/dL — ABNORMAL HIGH (ref 70–99)
HCT: 32 % — ABNORMAL LOW (ref 39.0–52.0)
Hemoglobin: 10.9 g/dL — ABNORMAL LOW (ref 13.0–17.0)
Potassium: 3.8 mmol/L (ref 3.5–5.1)
Sodium: 136 mmol/L (ref 135–145)
TCO2: 28 mmol/L (ref 22–32)

## 2024-02-14 SURGERY — AMPUTATION, TOE
Anesthesia: Monitor Anesthesia Care | Site: Toe | Laterality: Left

## 2024-02-14 MED ORDER — SODIUM CHLORIDE 0.9 % IV SOLN: INTRAVENOUS | Status: DC

## 2024-02-14 MED ORDER — DOXYCYCLINE HYCLATE 100 MG PO TABS: 100.0000 mg | ORAL_TABLET | Freq: Two times a day (BID) | ORAL | 0 refills | Status: DC

## 2024-02-14 MED ORDER — CEFAZOLIN SODIUM-DEXTROSE 2-4 GM/100ML-% IV SOLN
2.0000 g | INTRAVENOUS | Status: AC
  Administered 2024-02-14: 2 g via INTRAVENOUS
  Filled 2024-02-14: qty 100

## 2024-02-14 MED ORDER — MUPIROCIN 2 % EX OINT
1.0000 | TOPICAL_OINTMENT | Freq: Two times a day (BID) | CUTANEOUS | 2 refills | Status: DC
Start: 2024-02-14 — End: 2024-03-19

## 2024-02-14 MED ORDER — FENTANYL CITRATE (PF) 100 MCG/2ML IJ SOLN: 25.0000 ug | INTRAMUSCULAR | Status: DC | PRN

## 2024-02-14 MED ORDER — BUPIVACAINE HCL (PF) 0.5 % IJ SOLN
INTRAMUSCULAR | Status: AC
  Filled 2024-02-14: qty 30

## 2024-02-14 MED ORDER — LIDOCAINE HCL (PF) 1 % IJ SOLN
INTRAMUSCULAR | Status: DC | PRN
  Administered 2024-02-14: 10 mL

## 2024-02-14 MED ORDER — PROPOFOL 1000 MG/100ML IV EMUL
INTRAVENOUS | Status: AC
  Filled 2024-02-14: qty 100

## 2024-02-14 MED ORDER — CHLORHEXIDINE GLUCONATE 0.12 % MT SOLN
OROMUCOSAL | Status: AC
  Administered 2024-02-14: 15 mL via OROMUCOSAL
  Filled 2024-02-14: qty 15

## 2024-02-14 MED ORDER — OXYCODONE HCL 5 MG PO TABS: 5.0000 mg | ORAL_TABLET | Freq: Once | ORAL | Status: DC | PRN

## 2024-02-14 MED ORDER — MUPIROCIN 2 % EX OINT
1.0000 | TOPICAL_OINTMENT | Freq: Two times a day (BID) | CUTANEOUS | Status: DC
Start: 2024-02-14 — End: 2024-02-14
  Filled 2024-02-14: qty 22

## 2024-02-14 MED ORDER — PROPOFOL 10 MG/ML IV BOLUS
INTRAVENOUS | Status: DC | PRN
  Administered 2024-02-14: 20 mg via INTRAVENOUS
  Administered 2024-02-14: 50 mg via INTRAVENOUS

## 2024-02-14 MED ORDER — HYDROCODONE-ACETAMINOPHEN 5-325 MG PO TABS
1.0000 | ORAL_TABLET | Freq: Three times a day (TID) | ORAL | 0 refills | Status: DC | PRN
Start: 2024-02-14 — End: 2024-03-28

## 2024-02-14 MED ORDER — PROPOFOL 500 MG/50ML IV EMUL
INTRAVENOUS | Status: DC | PRN
  Administered 2024-02-14: 50 ug/kg/min via INTRAVENOUS

## 2024-02-14 MED ORDER — CHLORHEXIDINE GLUCONATE CLOTH 2 % EX PADS: 6.0000 | MEDICATED_PAD | Freq: Once | CUTANEOUS | Status: DC

## 2024-02-14 MED ORDER — 0.9 % SODIUM CHLORIDE (POUR BTL) OPTIME
TOPICAL | Status: DC | PRN
  Administered 2024-02-14: 1000 mL

## 2024-02-14 MED ORDER — ORAL CARE MOUTH RINSE: 15.0000 mL | Freq: Once | OROMUCOSAL | Status: AC

## 2024-02-14 MED ORDER — OXYCODONE HCL 5 MG/5ML PO SOLN: 5.0000 mg | Freq: Once | ORAL | Status: DC | PRN

## 2024-02-14 MED ORDER — INSULIN ASPART 100 UNIT/ML IJ SOLN: 0.0000 [IU] | INTRAMUSCULAR | Status: DC | PRN

## 2024-02-14 MED ORDER — LIDOCAINE HCL 2 % IJ SOLN
INTRAMUSCULAR | Status: AC
  Filled 2024-02-14: qty 20

## 2024-02-14 MED ORDER — MIDAZOLAM HCL 2 MG/2ML IJ SOLN
INTRAMUSCULAR | Status: AC
  Filled 2024-02-14: qty 2

## 2024-02-14 MED ORDER — ACETAMINOPHEN 10 MG/ML IV SOLN: 1000.0000 mg | Freq: Once | INTRAVENOUS | Status: DC | PRN

## 2024-02-14 MED ORDER — CHLORHEXIDINE GLUCONATE 0.12 % MT SOLN: 15.0000 mL | Freq: Once | OROMUCOSAL | Status: AC

## 2024-02-14 SURGICAL SUPPLY — 25 items
BAG COUNTER SPONGE SURGICOUNT (BAG) ×1 IMPLANT
BLADE LONG MED 31X9 (MISCELLANEOUS) IMPLANT
BNDG COMPR ESMARK 4X3 LF (GAUZE/BANDAGES/DRESSINGS) ×1 IMPLANT
BNDG ELASTIC 3INX 5YD STR LF (GAUZE/BANDAGES/DRESSINGS) ×1 IMPLANT
BNDG ELASTIC 4INX 5YD STR LF (GAUZE/BANDAGES/DRESSINGS) IMPLANT
BNDG GAUZE DERMACEA FLUFF 4 (GAUZE/BANDAGES/DRESSINGS) ×1 IMPLANT
CUFF TOURN SGL QUICK 18X4 (TOURNIQUET CUFF) IMPLANT
DRSG EMULSION OIL 3X3 NADH (GAUZE/BANDAGES/DRESSINGS) ×1 IMPLANT
ELECTRODE REM PT RTRN 9FT ADLT (ELECTROSURGICAL) ×1 IMPLANT
GAUZE SPONGE 4X4 12PLY STRL (GAUZE/BANDAGES/DRESSINGS) ×1 IMPLANT
GAUZE STRETCH 2X75IN STRL (MISCELLANEOUS) ×1 IMPLANT
GLOVE BIO SURGEON STRL SZ8 (GLOVE) ×2 IMPLANT
GOWN STRL REUS W/ TWL LRG LVL3 (GOWN DISPOSABLE) ×1 IMPLANT
GOWN STRL REUS W/ TWL XL LVL3 (GOWN DISPOSABLE) ×1 IMPLANT
KIT BASIN OR (CUSTOM PROCEDURE TRAY) ×1 IMPLANT
NDL HYPO 25X1 1.5 SAFETY (NEEDLE) ×1 IMPLANT
NEEDLE HYPO 25X1 1.5 SAFETY (NEEDLE) ×1 IMPLANT
NS IRRIG 1000ML POUR BTL (IV SOLUTION) IMPLANT
PACK ORTHO EXTREMITY (CUSTOM PROCEDURE TRAY) ×1 IMPLANT
SUCTION TUBE FRAZIER 10FR DISP (SUCTIONS) ×1 IMPLANT
SUT PROLENE 3 0 PS 2 (SUTURE) ×1 IMPLANT
SYR 10ML LL (SYRINGE) IMPLANT
TUBE CONNECTING 12X1/4 (SUCTIONS) ×1 IMPLANT
UNDERPAD 30X36 HEAVY ABSORB (UNDERPADS AND DIAPERS) ×1 IMPLANT
YANKAUER SUCT BULB TIP NO VENT (SUCTIONS) IMPLANT

## 2024-02-14 NOTE — Discharge Instructions (Signed)
 Resume all home medications a prior Wear surgical shoe at all times. See written instructions

## 2024-02-14 NOTE — Transfer of Care (Signed)
 Immediate Anesthesia Transfer of Care Note  Patient: [name] [name]  Procedure(s) Performed: AMPUTATION, TOE LEFT SECOND AND THIRD TOES (Left: Toe)  Patient Location: PACU  Anesthesia Type:MAC  Level of Consciousness: awake  Airway & Oxygen Therapy: Patient Spontanous Breathing  Post-op Assessment: Report given to RN and Post -op Vital signs reviewed and stable  Post vital signs: Reviewed and stable  Last Vitals:  Vitals Value Taken Time  BP    Temp    Pulse    Resp    SpO2      Last Pain:  Vitals:   02/14/24 1218  TempSrc:   PainSc: 0-No pain         Complications: No notable events documented.

## 2024-02-14 NOTE — Interval H&P Note (Signed)
 History and Physical Interval Note:  02/14/2024 1:57 PM  [name] [name]  has presented today for surgery, with the diagnosis of Gangrene of toes left 2nd and 3rd.  The various methods of treatment have been discussed with the patient and family. After consideration of risks, benefits and other options for treatment, the patient has consented to  Procedure(s): AMPUTATION, TOE (Left) as a surgical intervention.  The patient's history has been reviewed, patient examined, no change in status, stable for surgery.  I have reviewed the patient's chart and labs.  Questions were answered to the patient's satisfaction.    Discussed complete amputation of the 2nd and 3rd toes. Patient is in agreement. Again reviewed risks.   [name] Fees

## 2024-02-14 NOTE — Brief Op Note (Signed)
 02/14/2024  2:53 PM  PATIENT:  [name] [name]  63 y.o. male  PRE-OPERATIVE DIAGNOSIS:  Gangrene of toes left 2nd and 3rd  POST-OPERATIVE DIAGNOSIS:  Gangrene of toes left 2nd and 3rd  PROCEDURE:  Procedure(s): AMPUTATION, TOE LEFT SECOND AND THIRD TOES (Left)  SURGEON:  Surgeons and Role:    * Gershon Donnice SAUNDERS, DPM - Primary  PHYSICIAN ASSISTANT:   ASSISTANTS: none   ANESTHESIA:   MAC  EBL: Minimal   BLOOD ADMINISTERED:none  DRAINS: none   LOCAL MEDICATIONS USED:  MARCAINE    , BUPIVICAINE , and Amount: 10 ml  SPECIMEN:  Source of Specimen:  toes for pathology  DISPOSITION OF SPECIMEN:  PATHOLOGY  COUNTS:  YES  TOURNIQUET:  * No tourniquets in log *  DICTATION: .Dragon Dictation  PLAN OF CARE: Discharge to home after PACU  PATIENT DISPOSITION:  PACU - hemodynamically stable.   Delay start of Pharmacological VTE agent (>24hrs) due to surgical blood loss or risk of bleeding: no  Intraoperative findings: No purulence/proximal tracking. Metatarsal heads viable. Minimal bleeding noted. Incision closed without tension.

## 2024-02-15 ENCOUNTER — Encounter (HOSPITAL_COMMUNITY): Payer: Self-pay | Admitting: Podiatry

## 2024-02-15 NOTE — Anesthesia Postprocedure Evaluation (Signed)
 Anesthesia Post Note  Patient: [name] [name]  Procedure(s) Performed: AMPUTATION, TOE LEFT SECOND AND THIRD TOES (Left: Toe)     Patient location during evaluation: PACU Anesthesia Type: MAC Level of consciousness: awake and alert Pain management: pain level controlled Vital Signs Assessment: post-procedure vital signs reviewed and stable Respiratory status: spontaneous breathing, nonlabored ventilation, respiratory function stable and patient connected to nasal cannula oxygen Cardiovascular status: stable and blood pressure returned to baseline Postop Assessment: no apparent nausea or vomiting Anesthetic complications: no   No notable events documented.  Last Vitals:  Vitals:   02/14/24 1515 02/14/24 1530  BP: (!) 161/70 (!) 120/94  Pulse: (!) 54 (!) 52  Resp: 18 17  Temp:  36.9 C  SpO2: 95% 94%    Last Pain:  Vitals:   02/14/24 1530  TempSrc:   PainSc: 0-No pain                 Irish Piech L Shadavia Dampier

## 2024-02-16 ENCOUNTER — Other Ambulatory Visit: Payer: Self-pay

## 2024-02-16 ENCOUNTER — Other Ambulatory Visit: Payer: Self-pay | Admitting: Surgery

## 2024-02-16 DIAGNOSIS — I70262 Atherosclerosis of native arteries of extremities with gangrene, left leg: Secondary | ICD-10-CM

## 2024-02-19 ENCOUNTER — Ambulatory Visit: Payer: Self-pay | Admitting: Podiatry

## 2024-02-19 ENCOUNTER — Encounter: Admitting: Podiatry

## 2024-02-19 DIAGNOSIS — Z4901 Encounter for fitting and adjustment of extracorporeal dialysis catheter: Secondary | ICD-10-CM | POA: Diagnosis not present

## 2024-02-19 DIAGNOSIS — Z992 Dependence on renal dialysis: Secondary | ICD-10-CM | POA: Diagnosis not present

## 2024-02-19 DIAGNOSIS — D689 Coagulation defect, unspecified: Secondary | ICD-10-CM | POA: Diagnosis not present

## 2024-02-19 DIAGNOSIS — N2581 Secondary hyperparathyroidism of renal origin: Secondary | ICD-10-CM | POA: Diagnosis not present

## 2024-02-19 DIAGNOSIS — N186 End stage renal disease: Secondary | ICD-10-CM | POA: Diagnosis not present

## 2024-02-19 LAB — SURGICAL PATHOLOGY

## 2024-02-19 NOTE — Op Note (Signed)
 PATIENT:  [name] [name]  63 y.o. male   PRE-OPERATIVE DIAGNOSIS:  Gangrene of toes left 2nd and 3rd   POST-OPERATIVE DIAGNOSIS:  Gangrene of toes left 2nd and 3rd   PROCEDURE:  Procedure(s): AMPUTATION, TOE LEFT SECOND AND THIRD TOES (Left)   SURGEON:  Surgeons and Role:    * Gershon Donnice SAUNDERS, DPM - Primary   PHYSICIAN ASSISTANT:    ASSISTANTS: none    ANESTHESIA:   MAC   EBL: Minimal    BLOOD ADMINISTERED:none   DRAINS: none    LOCAL MEDICATIONS USED:  MARCAINE    , BUPIVICAINE , and Amount: 10 ml   SPECIMEN:  Source of Specimen:  toes for pathology   DISPOSITION OF SPECIMEN:  PATHOLOGY   COUNTS:  YES   TOURNIQUET:  * No tourniquets in log *   DICTATION: .Dragon Dictation   PLAN OF CARE: Discharge to home after PACU   PATIENT DISPOSITION:  PACU - hemodynamically stable.   Delay start of Pharmacological VTE agent (>24hrs) due to surgical blood loss or risk of bleeding: no   Intraoperative findings: No purulence/proximal tracking. Metatarsal heads viable. Minimal bleeding noted. Incision closed without tension.    Indications for surgery: 63 year old male presented the office today with mom for concerns of gangrenous changes to his second toe and started on the third toe.  Due to this recommend amputation of the toes.  He also has 1 small scabbing area present on the hallux but this is localized.  Discussed he may need to have further amputation.  Alternatives, risks, complications were discussed.  No promises or guarantees given as the outcome of the procedure and all questions were answered the best my ability.  Procedure in detail: The patient was both verbally and visually identified by myself, nursing staff, the anesthesia staff preoperatively.  He was then transferred to the operating room.  After an adequate plane of anesthesia was obtained a timeout was performed and a mixture of lidocaine , Marcaine  plain was infiltrated in a regional block fashion.  The  lower extremities and scrubbed, prepped, draped in normal sterile fashion.  Secondary timeout was performed.  At this time an incision was made for amputation of the left second, third toes of the level of the MPJ and a modified fishmouth incision.  The incision was made with a 15 blade scalpel from skin to bone.  The MPJs were identified and the toes were disarticulated at the level of the MTPJ.  Metatarsals appear to be viable.  No proximal tracking.  The toes were sent to pathology.  I irrigated the wound with saline.  The wound was then closed with nylon without tension.  Xeroform was applied followed by dressing.  He was woken from anesthesia and found to tolerated the procedure any complications.  Transferred to PACU vital signs stable and vascular status intact.  Of note there is minimal bleeding during the surgery.  Will need to monitor healing closely.

## 2024-02-20 ENCOUNTER — Ambulatory Visit (INDEPENDENT_AMBULATORY_CARE_PROVIDER_SITE_OTHER): Admitting: Podiatry

## 2024-02-20 ENCOUNTER — Other Ambulatory Visit: Payer: Self-pay

## 2024-02-20 ENCOUNTER — Encounter: Payer: Self-pay | Admitting: Podiatry

## 2024-02-20 VITALS — BP 169/73 | HR 61 | Temp 96.0°F

## 2024-02-20 DIAGNOSIS — I96 Gangrene, not elsewhere classified: Secondary | ICD-10-CM | POA: Diagnosis not present

## 2024-02-20 DIAGNOSIS — Z89422 Acquired absence of other left toe(s): Secondary | ICD-10-CM

## 2024-02-20 DIAGNOSIS — N186 End stage renal disease: Secondary | ICD-10-CM

## 2024-02-20 DIAGNOSIS — I70262 Atherosclerosis of native arteries of extremities with gangrene, left leg: Secondary | ICD-10-CM

## 2024-02-20 NOTE — Progress Notes (Signed)
 Subjective: Chief Complaint  Patient presents with   Routine Post Op    Rm13 POV 1 DOS 02/14/2024 Lt 2nd and 3rd amputation   63 year old male presents the office today with above concerns.  States has been doing well.  He did have some nausea and vomiting after dialysis yesterday and will in office this morning.  For 1 week from antibiotics.  He has no other symptoms.  No fevers or chills.    Surgery: Left 2nd and 3rd toe amputation DOS: 02/14/2024  Objective: AAO x3, NAD DP/PT pulses palpable bilaterally, CRT less than 3 seconds Status post left second, third toe potation's which appears to be healing well at this time.  Sutures intact.  Mild edema present.  There is no drainage or pus.  No ascending cellulitis.  No malodor. Stable changes on the hallux.  No pain with calf compression, swelling, warmth, erythema       Assessment: S/p left 2nd and 3rd toe amputation, PAD  Plan: -All treatment options discussed with the patient including all alternatives, risks, complications.  -At this time the incisions healing appropriately but sending to monitor closely given the PAD.  Xeroform was applied followed by dressing on the incision as well as the hallux wound.  Keep dressing clean, dry, intact.  If his mom would like to change the bandage they can apply a dry dressing. -Remain in surgical shoe -At this time he can discontinue antibiotics given GI upset.  If symptoms persist recommend follow-up with PCP. -Monitor for any clinical signs or symptoms of infection and directed to call the office immediately should any occur or go to the ER. -Patient encouraged to call the office with any questions, concerns, change in symptoms.   RTC 10 days- POSSIBLE suture removal; remain in surgical shoe  [name] Fees DPM

## 2024-02-22 DIAGNOSIS — H43813 Vitreous degeneration, bilateral: Secondary | ICD-10-CM | POA: Diagnosis not present

## 2024-02-22 DIAGNOSIS — Z961 Presence of intraocular lens: Secondary | ICD-10-CM | POA: Diagnosis not present

## 2024-02-22 DIAGNOSIS — E113211 Type 2 diabetes mellitus with mild nonproliferative diabetic retinopathy with macular edema, right eye: Secondary | ICD-10-CM | POA: Diagnosis not present

## 2024-02-22 DIAGNOSIS — E113512 Type 2 diabetes mellitus with proliferative diabetic retinopathy with macular edema, left eye: Secondary | ICD-10-CM | POA: Diagnosis not present

## 2024-02-26 DIAGNOSIS — R112 Nausea with vomiting, unspecified: Secondary | ICD-10-CM | POA: Diagnosis not present

## 2024-02-26 DIAGNOSIS — J329 Chronic sinusitis, unspecified: Secondary | ICD-10-CM | POA: Diagnosis not present

## 2024-02-26 DIAGNOSIS — J411 Mucopurulent chronic bronchitis: Secondary | ICD-10-CM | POA: Diagnosis not present

## 2024-02-28 DIAGNOSIS — N2581 Secondary hyperparathyroidism of renal origin: Secondary | ICD-10-CM | POA: Diagnosis not present

## 2024-02-28 DIAGNOSIS — Z992 Dependence on renal dialysis: Secondary | ICD-10-CM | POA: Diagnosis not present

## 2024-02-28 DIAGNOSIS — N186 End stage renal disease: Secondary | ICD-10-CM | POA: Diagnosis not present

## 2024-02-28 DIAGNOSIS — Z4901 Encounter for fitting and adjustment of extracorporeal dialysis catheter: Secondary | ICD-10-CM | POA: Diagnosis not present

## 2024-02-28 DIAGNOSIS — D689 Coagulation defect, unspecified: Secondary | ICD-10-CM | POA: Diagnosis not present

## 2024-02-29 ENCOUNTER — Ambulatory Visit (INDEPENDENT_AMBULATORY_CARE_PROVIDER_SITE_OTHER): Admitting: Podiatry

## 2024-02-29 ENCOUNTER — Encounter: Payer: Self-pay | Admitting: Podiatry

## 2024-02-29 VITALS — Ht 70.0 in | Wt 172.0 lb

## 2024-02-29 DIAGNOSIS — L97511 Non-pressure chronic ulcer of other part of right foot limited to breakdown of skin: Secondary | ICD-10-CM | POA: Diagnosis not present

## 2024-02-29 DIAGNOSIS — I96 Gangrene, not elsewhere classified: Secondary | ICD-10-CM | POA: Diagnosis not present

## 2024-02-29 DIAGNOSIS — Z89422 Acquired absence of other left toe(s): Secondary | ICD-10-CM | POA: Diagnosis not present

## 2024-02-29 NOTE — Patient Instructions (Signed)
 Please keep surgical site where the incision is dry.  If you do change the bandage you may apply a small amount of the mupirocin  ointment that was previously prescribed to the left first toe.  Right first toe you can apply the mupirocin  1-2 times a day and keep covered with bandage.  For all sites, monitor for signs of infection including redness, swelling, worsening drainage, increasing pain.

## 2024-02-29 NOTE — Progress Notes (Signed)
 Subjective:  Patient ID: [name] [name], male    DOB: 16-Jul-1960,  MRN: [id number]  Chief Complaint  Patient presents with   Routine Post Op    POV # 2 DOS 02/14/24 LT 2ND AND 3RD TOE AMPUTATION, pt states everything is going well, has no pain, some drainage from site when I took the bandages off.    DOS: 02/14/24 Procedure: Left 2nd and 3rd toe amputation with Dr. Gershon  63 y.o. male returns for post-op check.  He comes in using wheelchair.  Denies any pain to the surgical site.  Dressings have been left intact.  Also reporting area of scab formation right first toe.  Review of Systems: Negative except as noted in the HPI. Denies N/V/F/Ch.  Past Medical History:  Diagnosis Date   Anemia    Arthritis    Cardiomyopathy [facility])    July 2022   Diabetes mellitus [facility])    Dialysis patient [facility])    GERD (gastroesophageal reflux disease)    HTN (hypertension)    Hyperlipidemia    Hypothyroidism    Nephrotic syndrome    HD MWF   Obesity    Pulmonary hypertension (HCC)    July 2022 in setting of nephrotic syndrome   Secondary hyperparathyroidism of renal origin [facility])     Current Outpatient Medications:    Accu-Chek Softclix Lancets lancets, , Disp: , Rfl:    ACETAMINOPHEN  PO, Take 650 mg by mouth every 8 (eight) hours as needed for moderate pain (pain score 4-6)., Disp: , Rfl:    amitriptyline  (ELAVIL ) 10 MG tablet, Take 10 mg by mouth at bedtime., Disp: , Rfl:    amLODipine  (NORVASC ) 10 MG tablet, Take 1 tablet (10 mg total) by mouth every morning., Disp: 30 tablet, Rfl: 0   aspirin  (ASPIRIN  CHILDRENS) 81 MG chewable tablet, Chew 1 tablet (81 mg total) by mouth daily., Disp: 36 tablet, Rfl: 11   atorvastatin  (LIPITOR) 20 MG tablet, Take 20 mg by mouth in the morning., Disp: , Rfl:    AZITHROMYCIN  PO, Take by mouth 2 (two) times daily with a meal., Disp: , Rfl:    benzonatate  (TESSALON ) 200 MG capsule, Take 200 mg by mouth 3 (three) times daily as needed for cough., Disp: , Rfl:     calcium  acetate (PHOSLO) 667 MG capsule, Take 1,334 mg by mouth 3 (three) times daily with meals., Disp: , Rfl:    clopidogrel  (PLAVIX ) 75 MG tablet, Take 1 tablet (75 mg total) by mouth daily., Disp: 30 tablet, Rfl: 11   Cyanocobalamin  (VITAMIN B12 PO), Take 1 tablet by mouth at bedtime., Disp: , Rfl:    DM-APAP-CPM (CORICIDIN HBP PO), Take 2 tablets by mouth daily as needed (congestion)., Disp: , Rfl:    doxycycline  (VIBRA -TABS) 100 MG tablet, Take 1 tablet (100 mg total) by mouth 2 (two) times daily., Disp: 14 tablet, Rfl: 0   gabapentin  (NEURONTIN ) 100 MG capsule, Take 100 mg by mouth at bedtime., Disp: , Rfl:    hydrALAZINE  (APRESOLINE ) 10 MG tablet, Take 1 tablet (10 mg total) by mouth every 8 (eight) hours., Disp: 90 tablet, Rfl: 0   HYDROcodone -acetaminophen  (NORCO/VICODIN) 5-325 MG tablet, Take 1 tablet by mouth every 8 (eight) hours as needed., Disp: 10 tablet, Rfl: 0   levothyroxine  (SYNTHROID ) 50 MCG tablet, Take 50 mcg by mouth daily before breakfast., Disp: , Rfl:    metoprolol  tartrate (LOPRESSOR ) 50 MG tablet, Take 1 tablet (50 mg total) by mouth 2 (two) times daily., Disp: 60 tablet, Rfl:  0   mupirocin  ointment (BACTROBAN ) 2 %, Apply 1 Application topically 2 (two) times daily., Disp: 30 g, Rfl: 2   mupirocin  ointment (BACTROBAN ) 2 %, Apply 1 Application topically 2 (two) times daily., Disp: 30 g, Rfl: 2   pantoprazole  (PROTONIX ) 40 MG tablet, Take 1 tablet (40 mg total) by mouth daily. 30 minutes before breakfast, Disp: 90 tablet, Rfl: 3   PRESCRIPTION MEDICATION, , Disp: , Rfl:    Probiotic Product (PROBIOTIC DAILY PO), Take 1 capsule by mouth at bedtime., Disp: , Rfl:    senna-docusate (SENOKOT-S) 8.6-50 MG tablet, Take 1 tablet by mouth daily as needed for mild constipation., Disp: , Rfl:   Social History   Tobacco Use  Smoking Status Never  Smokeless Tobacco Never    No Known Allergies Objective:  There were no vitals filed for this visit. Body mass index is 24.68  kg/m. Constitutional Well developed. Well nourished.  Vascular Foot warm and well perfused. Capillary refill normal to all digits.  Capillary refill intact about the surgical site.  Neurologic Normal speech. Oriented to person, place, and time. Epicritic sensation to light touch grossly present bilaterally.  Dermatologic Sutures intact to left 2nd and 3rd toe amputation site.  Scant sanguinous drainage present.  No purulence.  No erythema.  Mild edema present.  Left hallux lesion stable.  There is similar lesion present right hallux medial nail border.      Orthopedic: Tenderness to palpation noted about the surgical site.    Assessment:   1. Gangrene (HCC)   2. S/P amputation of lesser toe, left (HCC)   3. Ulcer of great toe, right, limited to breakdown of skin [facility])    Plan:  Patient was evaluated and treated and all questions answered.  S/p foot surgery left 2nd and 3rd toe amputation secondary to gangrene -Progressing as expected post-operatively. -Sutures: Were left intact today, may be able to remove in about 1 week. -Medications: Has home prescription for mupirocin .  Can apply this to the left first toe dorsally and right hallux lesion as well - Surgical site dressed with Xeroform, 4 x 4 gauze, Kerlix, Ace wrap under minimal compression.  Mupirocin  bandage to the hallux lesions bilaterally.  Return in about 1 week (around 03/07/2024) for Post Op Suture Removal.

## 2024-03-04 DIAGNOSIS — Z992 Dependence on renal dialysis: Secondary | ICD-10-CM | POA: Diagnosis not present

## 2024-03-04 DIAGNOSIS — N186 End stage renal disease: Secondary | ICD-10-CM | POA: Diagnosis not present

## 2024-03-04 DIAGNOSIS — D689 Coagulation defect, unspecified: Secondary | ICD-10-CM | POA: Diagnosis not present

## 2024-03-04 DIAGNOSIS — Z4901 Encounter for fitting and adjustment of extracorporeal dialysis catheter: Secondary | ICD-10-CM | POA: Diagnosis not present

## 2024-03-04 DIAGNOSIS — N2581 Secondary hyperparathyroidism of renal origin: Secondary | ICD-10-CM | POA: Diagnosis not present

## 2024-03-07 ENCOUNTER — Ambulatory Visit: Admitting: Podiatry

## 2024-03-07 VITALS — Ht 70.0 in | Wt 172.0 lb

## 2024-03-07 DIAGNOSIS — Z89422 Acquired absence of other left toe(s): Secondary | ICD-10-CM

## 2024-03-07 DIAGNOSIS — I96 Gangrene, not elsewhere classified: Secondary | ICD-10-CM | POA: Diagnosis not present

## 2024-03-08 NOTE — Progress Notes (Signed)
  Subjective:  Patient ID: QUY LOTTS, male    DOB: 1961/05/10,  MRN: [id number]  Chief Complaint  Patient presents with   Post-op Follow-up    Rm 14 Patient is here for post op suture removal.    DOS: 02/14/24 Procedure: Left 2nd and 3rd toe amputation with Dr. Gershon  63 y.o. male returns for post-op check.  He comes in using wheelchair.  Denies any fevers or chills.  No significant pain in the foot today.  He presents today with his mom.  No Known Allergies Objective:  There were no vitals filed for this visit. Body mass index is 24.68 kg/m. Constitutional Well developed. Well nourished.  Vascular Foot warm and well perfused. Capillary refill normal to all digits.  Capillary refill intact about the surgical site.  Neurologic Normal speech. Oriented to person, place, and time. Epicritic sensation to light touch grossly present bilaterally.  Dermatologic Sutures intact to left 2nd and 3rd toe amputation site.  There is some dried blood, scabbing noted.  Remove the sutures today and there is still a superficial area of skin breakdown along the central aspect.  There is no drainage or pus noted today.  There is no ascending cellulitis.  Scabbing area still noted with eschar along the hallux without any changes.  No drainage or pus.  No fluctuation or crepitation.   Orthopedic: No significant tenderness to palpation noted about the surgical site.    Assessment:   1. Gangrene (HCC)   2. S/P amputation of lesser toe, left (HCC)   3. Ulcer of great toe, right, limited to breakdown of skin [facility])    Plan:  Patient was evaluated and treated and all questions answered.  S/p foot surgery left 2nd and 3rd toe amputation secondary to gangrene -Progressing as expected post-operatively. -Sutures: Remove the sutures today.  Steri-Strips applied for reinforcement followed by bandage. -Medications: Has home prescription for mupirocin .  Can apply this to the left first toe dorsally and  also along the incision site, small amount of dried with sterile dressing. -Remain in surgical shoe, offloading.  Limited weightbearing -Monitor for any clinical signs or symptoms of infection and directed to call the office immediately should any occur or go to the ER.  Return in about 2 weeks (around 03/21/2024).  [name] Gershon DPM

## 2024-03-11 DIAGNOSIS — Z992 Dependence on renal dialysis: Secondary | ICD-10-CM | POA: Diagnosis not present

## 2024-03-11 DIAGNOSIS — N2581 Secondary hyperparathyroidism of renal origin: Secondary | ICD-10-CM | POA: Diagnosis not present

## 2024-03-11 DIAGNOSIS — Z4901 Encounter for fitting and adjustment of extracorporeal dialysis catheter: Secondary | ICD-10-CM | POA: Diagnosis not present

## 2024-03-11 DIAGNOSIS — D689 Coagulation defect, unspecified: Secondary | ICD-10-CM | POA: Diagnosis not present

## 2024-03-11 DIAGNOSIS — N186 End stage renal disease: Secondary | ICD-10-CM | POA: Diagnosis not present

## 2024-03-12 DIAGNOSIS — E1122 Type 2 diabetes mellitus with diabetic chronic kidney disease: Secondary | ICD-10-CM | POA: Diagnosis not present

## 2024-03-12 DIAGNOSIS — N186 End stage renal disease: Secondary | ICD-10-CM | POA: Diagnosis not present

## 2024-03-12 DIAGNOSIS — Z992 Dependence on renal dialysis: Secondary | ICD-10-CM | POA: Diagnosis not present

## 2024-03-13 DIAGNOSIS — N2581 Secondary hyperparathyroidism of renal origin: Secondary | ICD-10-CM | POA: Diagnosis not present

## 2024-03-13 DIAGNOSIS — Z4901 Encounter for fitting and adjustment of extracorporeal dialysis catheter: Secondary | ICD-10-CM | POA: Diagnosis not present

## 2024-03-13 DIAGNOSIS — D689 Coagulation defect, unspecified: Secondary | ICD-10-CM | POA: Diagnosis not present

## 2024-03-13 DIAGNOSIS — Z992 Dependence on renal dialysis: Secondary | ICD-10-CM | POA: Diagnosis not present

## 2024-03-13 DIAGNOSIS — N186 End stage renal disease: Secondary | ICD-10-CM | POA: Diagnosis not present

## 2024-03-14 ENCOUNTER — Encounter: Admitting: Podiatry

## 2024-03-17 ENCOUNTER — Emergency Department (HOSPITAL_COMMUNITY)
Admission: EM | Admit: 2024-03-17 | Discharge: 2024-03-17 | Disposition: A | Discharge status: Home / Self Care | Source: Home / Self Care | Attending: Emergency Medicine | Admitting: Emergency Medicine

## 2024-03-17 ENCOUNTER — Encounter (HOSPITAL_COMMUNITY): Payer: Self-pay

## 2024-03-17 ENCOUNTER — Other Ambulatory Visit: Payer: Self-pay

## 2024-03-17 ENCOUNTER — Emergency Department (HOSPITAL_COMMUNITY)

## 2024-03-17 DIAGNOSIS — Z7902 Long term (current) use of antithrombotics/antiplatelets: Secondary | ICD-10-CM | POA: Insufficient documentation

## 2024-03-17 DIAGNOSIS — Z7982 Long term (current) use of aspirin: Secondary | ICD-10-CM | POA: Insufficient documentation

## 2024-03-17 DIAGNOSIS — D72829 Elevated white blood cell count, unspecified: Secondary | ICD-10-CM | POA: Insufficient documentation

## 2024-03-17 DIAGNOSIS — M79672 Pain in left foot: Secondary | ICD-10-CM | POA: Diagnosis not present

## 2024-03-17 DIAGNOSIS — Z79899 Other long term (current) drug therapy: Secondary | ICD-10-CM | POA: Insufficient documentation

## 2024-03-17 DIAGNOSIS — S91302A Unspecified open wound, left foot, initial encounter: Secondary | ICD-10-CM | POA: Diagnosis not present

## 2024-03-17 DIAGNOSIS — I10 Essential (primary) hypertension: Secondary | ICD-10-CM | POA: Insufficient documentation

## 2024-03-17 DIAGNOSIS — Z89429 Acquired absence of other toe(s), unspecified side: Secondary | ICD-10-CM | POA: Diagnosis not present

## 2024-03-17 DIAGNOSIS — E119 Type 2 diabetes mellitus without complications: Secondary | ICD-10-CM | POA: Insufficient documentation

## 2024-03-17 DIAGNOSIS — T8141XA Infection following a procedure, superficial incisional surgical site, initial encounter: Secondary | ICD-10-CM | POA: Diagnosis not present

## 2024-03-17 LAB — CBC WITH DIFFERENTIAL/PLATELET
Abs Immature Granulocytes: 0.06 K/uL (ref 0.00–0.07)
Basophils Absolute: 0.1 K/uL (ref 0.0–0.1)
Basophils Relative: 1 %
Eosinophils Absolute: 0.1 K/uL (ref 0.0–0.5)
Eosinophils Relative: 0 %
HCT: 34.8 % — ABNORMAL LOW (ref 39.0–52.0)
Hemoglobin: 11.1 g/dL — ABNORMAL LOW (ref 13.0–17.0)
Immature Granulocytes: 1 %
Lymphocytes Relative: 12 %
Lymphs Abs: 1.4 K/uL (ref 0.7–4.0)
MCH: 31.2 pg (ref 26.0–34.0)
MCHC: 31.9 g/dL (ref 30.0–36.0)
MCV: 97.8 fL (ref 80.0–100.0)
Monocytes Absolute: 0.5 K/uL (ref 0.1–1.0)
Monocytes Relative: 5 %
Neutro Abs: 9.4 K/uL — ABNORMAL HIGH (ref 1.7–7.7)
Neutrophils Relative %: 81 %
Platelets: 145 K/uL — ABNORMAL LOW (ref 150–400)
RBC: 3.56 MIL/uL — ABNORMAL LOW (ref 4.22–5.81)
RDW: 15 % (ref 11.5–15.5)
WBC: 11.5 K/uL — ABNORMAL HIGH (ref 4.0–10.5)
nRBC: 0 % (ref 0.0–0.2)

## 2024-03-17 LAB — BASIC METABOLIC PANEL WITH GFR
Anion gap: 13 (ref 5–15)
BUN: 18 mg/dL (ref 8–23)
CO2: 23 mmol/L (ref 22–32)
Calcium: 10.1 mg/dL (ref 8.9–10.3)
Chloride: 95 mmol/L — ABNORMAL LOW (ref 98–111)
Creatinine, Ser: 5.94 mg/dL — ABNORMAL HIGH (ref 0.61–1.24)
GFR, Estimated: 10 mL/min — ABNORMAL LOW (ref >60)
Glucose, Bld: 139 mg/dL — ABNORMAL HIGH (ref 70–99)
Potassium: 4.3 mmol/L (ref 3.5–5.1)
Sodium: 132 mmol/L — ABNORMAL LOW (ref 135–145)

## 2024-03-17 LAB — C-REACTIVE PROTEIN: CRP: 8.7 mg/dL — ABNORMAL HIGH (ref <1.0)

## 2024-03-17 LAB — SEDIMENTATION RATE: Sed Rate: 51 mm/h — ABNORMAL HIGH (ref 0–20)

## 2024-03-17 MED ORDER — KETOROLAC TROMETHAMINE 30 MG/ML IJ SOLN
30.0000 mg | Freq: Once | INTRAMUSCULAR | Status: AC
  Administered 2024-03-17: 30 mg via INTRAMUSCULAR
  Filled 2024-03-17: qty 1

## 2024-03-17 MED ORDER — OXYCODONE HCL 5 MG PO TABS
5.0000 mg | ORAL_TABLET | Freq: Once | ORAL | Status: AC
  Administered 2024-03-17: 5 mg via ORAL
  Filled 2024-03-17: qty 1

## 2024-03-17 MED ORDER — OXYCODONE-ACETAMINOPHEN 5-325 MG PO TABS: 1.0000 | ORAL_TABLET | Freq: Four times a day (QID) | ORAL | 0 refills | Status: DC | PRN

## 2024-03-17 MED ORDER — DOXYCYCLINE HYCLATE 100 MG PO TABS
100.0000 mg | ORAL_TABLET | Freq: Once | ORAL | Status: AC
  Administered 2024-03-17: 100 mg via ORAL
  Filled 2024-03-17: qty 1

## 2024-03-17 MED ORDER — DOXYCYCLINE HYCLATE 100 MG PO CAPS
100.0000 mg | ORAL_CAPSULE | Freq: Two times a day (BID) | ORAL | 0 refills | Status: DC
Start: 2024-03-17 — End: 2024-03-28

## 2024-03-17 MED ORDER — GADOBUTROL 1 MMOL/ML IV SOLN
7.5000 mL | Freq: Once | INTRAVENOUS | Status: AC | PRN
  Administered 2024-03-17: 7.5 mL via INTRAVENOUS

## 2024-03-17 MED ORDER — ONDANSETRON 4 MG PO TBDP
4.0000 mg | ORAL_TABLET | Freq: Once | ORAL | Status: AC
  Administered 2024-03-17: 4 mg via ORAL
  Filled 2024-03-17: qty 1

## 2024-03-17 NOTE — Discharge Instructions (Signed)
 Your preliminary read on your MRI demonstrates the changes from your previous surgery without any signs of abscesses, bone infection, or cellulitis or skin infections.  Because of your new and severe pain changes going to cover you with antibiotics just in case.  I recommend that you call your orthopedic surgeon first thing tomorrow morning for evaluation of the foot and further recommendations from them.  I have also given you pain medication to cover you until that time.

## 2024-03-17 NOTE — ED Provider Notes (Signed)
 [place] EMERGENCY DEPARTMENT AT [facility] Provider Note   CSN: [id number] Arrival date & time: 03/17/24  1333     Patient presents with: Foot Pain   [name] [name] is a 63 y.o. male.    63 y.o. male  with pmh of cardiomyopathy, hypertension, hyperlipidemia, diabetes presenting for new pain at his amputation site.  Patient had amputation of his 2nd and 3rd digit on his left foot on 9/03 /2025.  He has only taken Tylenol  since his procedure. Reports little to no pain since procedure.  Tonight he began having severe pain around his incision site with redness on the dorsum of his foot. Denies drainage. Denies fevers/chills.  The history is provided by the patient. No language interpreter was used.  Foot Pain Pertinent negatives include no chest pain, no abdominal pain and no shortness of breath.       Prior to Admission medications   Medication Sig Start Date End Date Taking? Authorizing Provider  doxycycline  (VIBRAMYCIN ) 100 MG capsule Take 1 capsule (100 mg total) by mouth 2 (two) times daily for 10 days. 03/17/24 03/27/24 Yes Elnor Hila P, DO  oxyCODONE -acetaminophen  (PERCOCET/ROXICET) 5-325 MG tablet Take 1 tablet by mouth every 6 (six) hours as needed for severe pain (pain score 7-10). 03/17/24  Yes Elnor Hila P, DO  Accu-Chek Softclix Lancets lancets  08/06/20   [provider]  ACETAMINOPHEN  PO Take 650 mg by mouth every 8 (eight) hours as needed for moderate pain (pain score 4-6).    [provider]  amitriptyline  (ELAVIL ) 10 MG tablet Take 10 mg by mouth at bedtime.    [provider]  amLODipine  (NORVASC ) 10 MG tablet Take 1 tablet (10 mg total) by mouth every morning. 03/29/23   Regalado, Belkys A, MD  aspirin  (ASPIRIN  CHILDRENS) 81 MG chewable tablet Chew 1 tablet (81 mg total) by mouth daily. 01/30/24 01/29/25  Serene Gaile ORN, MD  atorvastatin  (LIPITOR) 20 MG tablet Take 20 mg by mouth in the morning.    [provider]   AZITHROMYCIN  PO Take by mouth 2 (two) times daily with a meal.    [provider]  benzonatate  (TESSALON ) 200 MG capsule Take 200 mg by mouth 3 (three) times daily as needed for cough.    [provider]  calcium  acetate (PHOSLO) 667 MG capsule Take 1,334 mg by mouth 3 (three) times daily with meals. 07/07/23 06/27/24  [provider]  clopidogrel  (PLAVIX ) 75 MG tablet Take 1 tablet (75 mg total) by mouth daily. 01/30/24   Serene Gaile ORN, MD  Cyanocobalamin  (VITAMIN B12 PO) Take 1 tablet by mouth at bedtime.    [provider]  DM-APAP-CPM (CORICIDIN HBP PO) Take 2 tablets by mouth daily as needed (congestion).    [provider]  gabapentin  (NEURONTIN ) 100 MG capsule Take 100 mg by mouth at bedtime. 07/05/23   [provider]  hydrALAZINE  (APRESOLINE ) 10 MG tablet Take 1 tablet (10 mg total) by mouth every 8 (eight) hours. 03/29/23   Regalado, Belkys A, MD  HYDROcodone -acetaminophen  (NORCO/VICODIN) 5-325 MG tablet Take 1 tablet by mouth every 8 (eight) hours as needed. 02/14/24   Gershon Donnice SAUNDERS, DPM  levothyroxine  (SYNTHROID ) 50 MCG tablet Take 50 mcg by mouth daily before breakfast.    [provider]  metoprolol  tartrate (LOPRESSOR ) 50 MG tablet Take 1 tablet (50 mg total) by mouth 2 (two) times daily. 06/16/20   Comer Kirsch, PA-C  mupirocin  ointment (BACTROBAN ) 2 % Apply  1 Application topically 2 (two) times daily. 11/23/23   Gershon Donnice SAUNDERS, DPM  mupirocin  ointment (BACTROBAN ) 2 % Apply 1 Application topically 2 (two) times daily. 02/14/24   Gershon Donnice SAUNDERS, DPM  pantoprazole  (PROTONIX ) 40 MG tablet Take 1 tablet (40 mg total) by mouth daily. 30 minutes before breakfast 06/01/23   Shirlean Therisa ORN, NP  PRESCRIPTION MEDICATION     [provider]  Probiotic Product (PROBIOTIC DAILY PO) Take 1 capsule by mouth at bedtime.    [provider]  senna-docusate (SENOKOT-S) 8.6-50 MG tablet Take 1 tablet by mouth daily  as needed for mild constipation.    [provider]    Allergies: Patient has no known allergies.    Review of Systems  Constitutional:  Negative for chills and fever.  HENT:  Negative for ear pain and sore throat.   Eyes:  Negative for pain and visual disturbance.  Respiratory:  Negative for cough and shortness of breath.   Cardiovascular:  Negative for chest pain and palpitations.  Gastrointestinal:  Negative for abdominal pain and vomiting.  Genitourinary:  Negative for dysuria and hematuria.  Musculoskeletal:  Negative for arthralgias and back pain.  Skin:  Positive for color change and wound. Negative for rash.  Neurological:  Negative for seizures and syncope.  All other systems reviewed and are negative.   Updated Vital Signs BP (!) 156/74 (BP Location: Right Arm)   Pulse 67   Temp 98.7 F (37.1 C) (Oral)   Resp 18   Ht 5' 10 (1.778 m)   Wt 81.6 kg   SpO2 96%   BMI 25.83 kg/m   Physical Exam Vitals and nursing note reviewed.  Constitutional:      General: He is not in acute distress.    Appearance: He is well-developed.  HENT:     Head: Normocephalic and atraumatic.  Eyes:     Conjunctiva/sclera: Conjunctivae normal.  Cardiovascular:     Rate and Rhythm: Normal rate and regular rhythm.     Heart sounds: No murmur heard. Pulmonary:     Effort: Pulmonary effort is normal. No respiratory distress.     Breath sounds: Normal breath sounds.  Abdominal:     Palpations: Abdomen is soft.     Tenderness: There is no abdominal tenderness.  Musculoskeletal:        General: No swelling.     Cervical back: Neck supple.       Feet:  Skin:    General: Skin is warm and dry.     Capillary Refill: Capillary refill takes less than 2 seconds.  Neurological:     Mental Status: He is alert.  Psychiatric:        Mood and Affect: Mood normal.     (all labs ordered are listed, but only abnormal results are displayed) Labs Reviewed  CBC WITH  DIFFERENTIAL/PLATELET - Abnormal; Notable for the following components:      Result Value   WBC 11.5 (*)    RBC 3.56 (*)    Hemoglobin 11.1 (*)    HCT 34.8 (*)    Platelets 145 (*)    Neutro Abs 9.4 (*)    All other components within normal limits  BASIC METABOLIC PANEL WITH GFR - Abnormal; Notable for the following components:   Sodium 132 (*)    Chloride 95 (*)    Glucose, Bld 139 (*)    Creatinine, Ser 5.94 (*)    GFR, Estimated 10 (*)    All  other components within normal limits  C-REACTIVE PROTEIN - Abnormal; Notable for the following components:   CRP 8.7 (*)    All other components within normal limits  SEDIMENTATION RATE - Abnormal; Notable for the following components:   Sed Rate 51 (*)    All other components within normal limits  AEROBIC CULTURE W GRAM STAIN (SUPERFICIAL SPECIMEN)    EKG: None  Radiology: No results found.   Procedures   Medications Ordered in the ED  doxycycline  (VIBRA -TABS) tablet 100 mg (has no administration in time range)  oxyCODONE  (Oxy IR/ROXICODONE ) immediate release tablet 5 mg (5 mg Oral Given 03/17/24 1647)  ketorolac (TORADOL) 30 MG/ML injection 30 mg (30 mg Intramuscular Given 03/17/24 1647)  ondansetron  (ZOFRAN -ODT) disintegrating tablet 4 mg (4 mg Oral Given 03/17/24 1648)  gadobutrol  (GADAVIST ) 1 MMOL/ML injection 7.5 mL (7.5 mLs Intravenous Contrast Given 03/17/24 1736)                                    Medical Decision Making Amount and/or Complexity of Data Reviewed Labs: ordered. Radiology: ordered.  Risk Prescription drug management.   8:55 PM  63 y.o. male  with pmh of cardiomyopathy, hypertension, hyperlipidemia, diabetes presenting for new pain at his 2nd and 3rd digit left foot amputation site completed on 02/14/2024.  Patient is alert and O x 3, no acute distress, afebrile, send vital signs.  Physical exam demonstrates Amputation of the 2nd and 3rd digit present.  Wound is open.  Serosanguineous fluid.  No  purulent drainage.  Small region of erythema approximately 2 cm from the wound tracking up to the dorsum of the foot.  Concerns for infection including but not limited to cellulitis, abscess, osteomyelitis, etc.  Patient has leukocytosis of 11.5.  Sed rate of 51.  CRP pending.  MRI pending.  I have called [facility] radiology due to concern the MRI was taking over 3 hours to be read.   The prelim read says soft tissue defect overlying the second metatarsal head extending to the bone surface.  No associated marrow changes no findings elsewhere or osteomyelitis.  No abscesses.  Due to patient's new pain we will prescribe antibiotics with recommendations to follow-up with the patient's orthopedic specialist first thing tomorrow morning and take antibiotics with their discretion.  Doxycycline  given in ED and prescribed.  Medication given for pain control.  Will call patient with any changes to formal MRI read.  Patient in no distress and overall condition improved here in the ED. Detailed discussions were had with the patient regarding current findings, and need for close f/u with PCP or on call doctor. The patient has been instructed to return immediately if the symptoms worsen in any way for re-evaluation. Patient verbalized understanding and is in agreement with current care plan. All questions answered prior to discharge.      Final diagnoses:  Left foot pain    ED Discharge Orders          Ordered    oxyCODONE -acetaminophen  (PERCOCET/ROXICET) 5-325 MG tablet  Every 6 hours PRN        03/17/24 2055    doxycycline  (VIBRAMYCIN ) 100 MG capsule  2 times daily        03/17/24 2055               Elnor Bernarda SQUIBB, DO 03/17/24 2055

## 2024-03-17 NOTE — ED Provider Triage Note (Signed)
 Emergency Medicine Provider Triage Evaluation Note  [name] [name] , a 63 y.o. male  was evaluated in triage.  Pt complains of foot pain.  Patient had amputation of his 2nd and 3rd digit on his left foot on 903 2025.  He has only taken Tylenol  since his procedure.  He has had 0 pain.  Tonight he began having severe pain around his incision site.  Review of Systems  Positive: Pain Negative: Fevers, warmth, redness around the wound site  Physical Exam  BP (!) 166/79 (BP Location: Right Arm)   Pulse 66   Temp 98.6 F (37 C)   Resp 16   Ht 5' 10 (1.778 m)   Wt 81.6 kg   SpO2 95%   BMI 25.83 kg/m  Gen:   Awake, no distress   Resp:  Normal effort  MSK:   Moves extremities without difficulty  Skin:  Skin exam demonstrates an open wound with serosanguineous fluid.  About 2 cm of erythema to the dorsum of the foot tracking from the wound cephalad.  No purulent fluid or drainage.  Medical Decision Making  Medically screening exam initiated at 4:12 PM.  Appropriate orders placed.  [name] [name] was informed that the remainder of the evaluation will be completed by another provider, this initial triage assessment does not replace that evaluation, and the importance of remaining in the ED until their evaluation is complete.     Elnor Hila P, DO 03/19/24 1426

## 2024-03-17 NOTE — ED Triage Notes (Addendum)
 Pt reports amputation of toes about a month ago and has been doing fine.  Last night pt started to have pain in his foot that kept him awake all night.  Pt is supposed to see surgeon on Tuesday but can not tolerate the pain.Pt mother reports foot looks fine and denies any redness or drainage.

## 2024-03-17 NOTE — ED Notes (Signed)
 Pt updated that MRI results are still pending.

## 2024-03-18 ENCOUNTER — Ambulatory Visit (HOSPITAL_COMMUNITY)

## 2024-03-18 ENCOUNTER — Ambulatory Visit

## 2024-03-18 ENCOUNTER — Telehealth: Payer: Self-pay | Admitting: Podiatry

## 2024-03-18 DIAGNOSIS — D689 Coagulation defect, unspecified: Secondary | ICD-10-CM | POA: Diagnosis not present

## 2024-03-18 DIAGNOSIS — Z4901 Encounter for fitting and adjustment of extracorporeal dialysis catheter: Secondary | ICD-10-CM | POA: Diagnosis not present

## 2024-03-18 DIAGNOSIS — N2581 Secondary hyperparathyroidism of renal origin: Secondary | ICD-10-CM | POA: Diagnosis not present

## 2024-03-18 DIAGNOSIS — Z992 Dependence on renal dialysis: Secondary | ICD-10-CM | POA: Diagnosis not present

## 2024-03-18 NOTE — Telephone Encounter (Signed)
 Patient's mother called and would like to let Dr. Gershon know that patient went to the emergency room yesterday. They did a MRI of the foot, and there were no signs of infection. Patient has post-op appointment tomorrow.

## 2024-03-19 ENCOUNTER — Encounter: Payer: Self-pay | Admitting: Podiatry

## 2024-03-19 ENCOUNTER — Encounter (HOSPITAL_COMMUNITY): Payer: Self-pay

## 2024-03-19 ENCOUNTER — Inpatient Hospital Stay (HOSPITAL_COMMUNITY)

## 2024-03-19 ENCOUNTER — Other Ambulatory Visit: Payer: Self-pay

## 2024-03-19 ENCOUNTER — Inpatient Hospital Stay (HOSPITAL_COMMUNITY)
Admission: EM | Admit: 2024-03-19 | Discharge: 2024-03-28 | DRG: 856 | Disposition: A | Discharge status: Home / Self Care | Attending: Student | Admitting: Student

## 2024-03-19 ENCOUNTER — Ambulatory Visit: Admitting: Podiatry

## 2024-03-19 VITALS — BP 131/63 | HR 61 | Temp 98.4°F

## 2024-03-19 DIAGNOSIS — I272 Pulmonary hypertension, unspecified: Secondary | ICD-10-CM | POA: Diagnosis present

## 2024-03-19 DIAGNOSIS — Z8249 Family history of ischemic heart disease and other diseases of the circulatory system: Secondary | ICD-10-CM

## 2024-03-19 DIAGNOSIS — E1152 Type 2 diabetes mellitus with diabetic peripheral angiopathy with gangrene: Secondary | ICD-10-CM | POA: Diagnosis present

## 2024-03-19 DIAGNOSIS — L0889 Other specified local infections of the skin and subcutaneous tissue: Secondary | ICD-10-CM | POA: Diagnosis present

## 2024-03-19 DIAGNOSIS — Y835 Amputation of limb(s) as the cause of abnormal reaction of the patient, or of later complication, without mention of misadventure at the time of the procedure: Secondary | ICD-10-CM | POA: Diagnosis present

## 2024-03-19 DIAGNOSIS — M79672 Pain in left foot: Secondary | ICD-10-CM | POA: Diagnosis present

## 2024-03-19 DIAGNOSIS — T8130XA Disruption of wound, unspecified, initial encounter: Secondary | ICD-10-CM | POA: Diagnosis not present

## 2024-03-19 DIAGNOSIS — Z89422 Acquired absence of other left toe(s): Secondary | ICD-10-CM | POA: Diagnosis not present

## 2024-03-19 DIAGNOSIS — Z833 Family history of diabetes mellitus: Secondary | ICD-10-CM

## 2024-03-19 DIAGNOSIS — Z539 Procedure and treatment not carried out, unspecified reason: Secondary | ICD-10-CM | POA: Diagnosis present

## 2024-03-19 DIAGNOSIS — Z87441 Personal history of nephrotic syndrome: Secondary | ICD-10-CM

## 2024-03-19 DIAGNOSIS — Z79890 Hormone replacement therapy (postmenopausal): Secondary | ICD-10-CM

## 2024-03-19 DIAGNOSIS — E11621 Type 2 diabetes mellitus with foot ulcer: Secondary | ICD-10-CM | POA: Diagnosis present

## 2024-03-19 DIAGNOSIS — D638 Anemia in other chronic diseases classified elsewhere: Secondary | ICD-10-CM | POA: Diagnosis present

## 2024-03-19 DIAGNOSIS — E1169 Type 2 diabetes mellitus with other specified complication: Secondary | ICD-10-CM | POA: Diagnosis not present

## 2024-03-19 DIAGNOSIS — M869 Osteomyelitis, unspecified: Secondary | ICD-10-CM | POA: Diagnosis present

## 2024-03-19 DIAGNOSIS — B9562 Methicillin resistant Staphylococcus aureus infection as the cause of diseases classified elsewhere: Secondary | ICD-10-CM | POA: Diagnosis not present

## 2024-03-19 DIAGNOSIS — I429 Cardiomyopathy, unspecified: Secondary | ICD-10-CM | POA: Diagnosis present

## 2024-03-19 DIAGNOSIS — Z9862 Peripheral vascular angioplasty status: Secondary | ICD-10-CM | POA: Diagnosis not present

## 2024-03-19 DIAGNOSIS — I509 Heart failure, unspecified: Secondary | ICD-10-CM | POA: Diagnosis not present

## 2024-03-19 DIAGNOSIS — E785 Hyperlipidemia, unspecified: Secondary | ICD-10-CM | POA: Diagnosis not present

## 2024-03-19 DIAGNOSIS — E1122 Type 2 diabetes mellitus with diabetic chronic kidney disease: Secondary | ICD-10-CM | POA: Diagnosis not present

## 2024-03-19 DIAGNOSIS — Z794 Long term (current) use of insulin: Secondary | ICD-10-CM | POA: Diagnosis not present

## 2024-03-19 DIAGNOSIS — L089 Local infection of the skin and subcutaneous tissue, unspecified: Secondary | ICD-10-CM | POA: Diagnosis not present

## 2024-03-19 DIAGNOSIS — Z992 Dependence on renal dialysis: Secondary | ICD-10-CM

## 2024-03-19 DIAGNOSIS — E039 Hypothyroidism, unspecified: Secondary | ICD-10-CM | POA: Diagnosis present

## 2024-03-19 DIAGNOSIS — Z7902 Long term (current) use of antithrombotics/antiplatelets: Secondary | ICD-10-CM

## 2024-03-19 DIAGNOSIS — I96 Gangrene, not elsewhere classified: Secondary | ICD-10-CM

## 2024-03-19 DIAGNOSIS — Z79899 Other long term (current) drug therapy: Secondary | ICD-10-CM

## 2024-03-19 DIAGNOSIS — I132 Hypertensive heart and chronic kidney disease with heart failure and with stage 5 chronic kidney disease, or end stage renal disease: Secondary | ICD-10-CM | POA: Diagnosis present

## 2024-03-19 DIAGNOSIS — Z8261 Family history of arthritis: Secondary | ICD-10-CM

## 2024-03-19 DIAGNOSIS — I120 Hypertensive chronic kidney disease with stage 5 chronic kidney disease or end stage renal disease: Secondary | ICD-10-CM

## 2024-03-19 DIAGNOSIS — E876 Hypokalemia: Secondary | ICD-10-CM | POA: Diagnosis present

## 2024-03-19 DIAGNOSIS — T8131XD Disruption of external operation (surgical) wound, not elsewhere classified, subsequent encounter: Secondary | ICD-10-CM | POA: Diagnosis not present

## 2024-03-19 DIAGNOSIS — M7989 Other specified soft tissue disorders: Secondary | ICD-10-CM | POA: Diagnosis not present

## 2024-03-19 DIAGNOSIS — T8781 Dehiscence of amputation stump: Secondary | ICD-10-CM | POA: Diagnosis present

## 2024-03-19 DIAGNOSIS — N186 End stage renal disease: Secondary | ICD-10-CM | POA: Diagnosis present

## 2024-03-19 DIAGNOSIS — L97519 Non-pressure chronic ulcer of other part of right foot with unspecified severity: Secondary | ICD-10-CM | POA: Diagnosis present

## 2024-03-19 DIAGNOSIS — M86172 Other acute osteomyelitis, left ankle and foot: Secondary | ICD-10-CM | POA: Diagnosis not present

## 2024-03-19 DIAGNOSIS — Z8616 Personal history of COVID-19: Secondary | ICD-10-CM

## 2024-03-19 DIAGNOSIS — N2581 Secondary hyperparathyroidism of renal origin: Secondary | ICD-10-CM | POA: Diagnosis not present

## 2024-03-19 DIAGNOSIS — I739 Peripheral vascular disease, unspecified: Secondary | ICD-10-CM | POA: Diagnosis present

## 2024-03-19 DIAGNOSIS — L97529 Non-pressure chronic ulcer of other part of left foot with unspecified severity: Secondary | ICD-10-CM | POA: Diagnosis not present

## 2024-03-19 DIAGNOSIS — E119 Type 2 diabetes mellitus without complications: Secondary | ICD-10-CM

## 2024-03-19 DIAGNOSIS — Z9889 Other specified postprocedural states: Secondary | ICD-10-CM | POA: Diagnosis not present

## 2024-03-19 DIAGNOSIS — Z1152 Encounter for screening for COVID-19: Secondary | ICD-10-CM | POA: Diagnosis not present

## 2024-03-19 DIAGNOSIS — I110 Hypertensive heart disease with heart failure: Secondary | ICD-10-CM | POA: Diagnosis not present

## 2024-03-19 DIAGNOSIS — T8149XA Infection following a procedure, other surgical site, initial encounter: Principal | ICD-10-CM | POA: Diagnosis present

## 2024-03-19 DIAGNOSIS — T8131XA Disruption of external operation (surgical) wound, not elsewhere classified, initial encounter: Secondary | ICD-10-CM

## 2024-03-19 DIAGNOSIS — M62262 Nontraumatic ischemic infarction of muscle, left lower leg: Secondary | ICD-10-CM | POA: Diagnosis not present

## 2024-03-19 DIAGNOSIS — E7849 Other hyperlipidemia: Secondary | ICD-10-CM | POA: Diagnosis present

## 2024-03-19 DIAGNOSIS — I10 Essential (primary) hypertension: Secondary | ICD-10-CM | POA: Diagnosis present

## 2024-03-19 DIAGNOSIS — E1151 Type 2 diabetes mellitus with diabetic peripheral angiopathy without gangrene: Secondary | ICD-10-CM | POA: Diagnosis not present

## 2024-03-19 DIAGNOSIS — I70222 Atherosclerosis of native arteries of extremities with rest pain, left leg: Secondary | ICD-10-CM | POA: Diagnosis not present

## 2024-03-19 DIAGNOSIS — D631 Anemia in chronic kidney disease: Secondary | ICD-10-CM | POA: Diagnosis present

## 2024-03-19 DIAGNOSIS — D696 Thrombocytopenia, unspecified: Secondary | ICD-10-CM | POA: Diagnosis present

## 2024-03-19 DIAGNOSIS — T797XXA Traumatic subcutaneous emphysema, initial encounter: Secondary | ICD-10-CM | POA: Diagnosis not present

## 2024-03-19 DIAGNOSIS — T8141XA Infection following a procedure, superficial incisional surgical site, initial encounter: Principal | ICD-10-CM | POA: Diagnosis present

## 2024-03-19 DIAGNOSIS — I70262 Atherosclerosis of native arteries of extremities with gangrene, left leg: Secondary | ICD-10-CM | POA: Diagnosis not present

## 2024-03-19 DIAGNOSIS — I70292 Other atherosclerosis of native arteries of extremities, left leg: Secondary | ICD-10-CM | POA: Diagnosis not present

## 2024-03-19 DIAGNOSIS — T8789 Other complications of amputation stump: Secondary | ICD-10-CM | POA: Diagnosis not present

## 2024-03-19 DIAGNOSIS — I5043 Acute on chronic combined systolic (congestive) and diastolic (congestive) heart failure: Secondary | ICD-10-CM | POA: Diagnosis not present

## 2024-03-19 DIAGNOSIS — Z8042 Family history of malignant neoplasm of prostate: Secondary | ICD-10-CM

## 2024-03-19 DIAGNOSIS — K219 Gastro-esophageal reflux disease without esophagitis: Secondary | ICD-10-CM | POA: Diagnosis not present

## 2024-03-19 LAB — CBC WITH DIFFERENTIAL/PLATELET
Abs Immature Granulocytes: 0.04 K/uL (ref 0.00–0.07)
Basophils Absolute: 0.1 K/uL (ref 0.0–0.1)
Basophils Relative: 1 %
Eosinophils Absolute: 0.1 K/uL (ref 0.0–0.5)
Eosinophils Relative: 1 %
HCT: 33.1 % — ABNORMAL LOW (ref 39.0–52.0)
Hemoglobin: 10.2 g/dL — ABNORMAL LOW (ref 13.0–17.0)
Immature Granulocytes: 0 %
Lymphocytes Relative: 15 %
Lymphs Abs: 1.4 K/uL (ref 0.7–4.0)
MCH: 30.7 pg (ref 26.0–34.0)
MCHC: 30.8 g/dL (ref 30.0–36.0)
MCV: 99.7 fL (ref 80.0–100.0)
Monocytes Absolute: 0.6 K/uL (ref 0.1–1.0)
Monocytes Relative: 6 %
Neutro Abs: 7.3 K/uL (ref 1.7–7.7)
Neutrophils Relative %: 77 %
Platelets: 148 K/uL — ABNORMAL LOW (ref 150–400)
RBC: 3.32 MIL/uL — ABNORMAL LOW (ref 4.22–5.81)
RDW: 14.7 % (ref 11.5–15.5)
WBC: 9.5 K/uL (ref 4.0–10.5)
nRBC: 0 % (ref 0.0–0.2)

## 2024-03-19 LAB — VAS US ABI WITH/WO TBI

## 2024-03-19 LAB — COMPREHENSIVE METABOLIC PANEL WITH GFR
ALT: 7 U/L (ref 0–44)
AST: 14 U/L — ABNORMAL LOW (ref 15–41)
Albumin: 2.9 g/dL — ABNORMAL LOW (ref 3.5–5.0)
Alkaline Phosphatase: 83 U/L (ref 38–126)
Anion gap: 15 (ref 5–15)
BUN: 12 mg/dL (ref 8–23)
CO2: 25 mmol/L (ref 22–32)
Calcium: 8.9 mg/dL (ref 8.9–10.3)
Chloride: 97 mmol/L — ABNORMAL LOW (ref 98–111)
Creatinine, Ser: 4.43 mg/dL — ABNORMAL HIGH (ref 0.61–1.24)
GFR, Estimated: 14 mL/min — ABNORMAL LOW (ref >60)
Glucose, Bld: 117 mg/dL — ABNORMAL HIGH (ref 70–99)
Potassium: 3.6 mmol/L (ref 3.5–5.1)
Sodium: 137 mmol/L (ref 135–145)
Total Bilirubin: 0.7 mg/dL (ref 0.0–1.2)
Total Protein: 6.7 g/dL (ref 6.5–8.1)

## 2024-03-19 LAB — I-STAT CG4 LACTIC ACID, ED: Lactic Acid, Venous: 1.4 mmol/L (ref 0.5–1.9)

## 2024-03-19 MED ORDER — LEVOTHYROXINE SODIUM 50 MCG PO TABS
50.0000 ug | ORAL_TABLET | Freq: Every day | ORAL | Status: DC
  Administered 2024-03-21 – 2024-03-28 (×8): 50 ug via ORAL
  Filled 2024-03-19 (×8): qty 1

## 2024-03-19 MED ORDER — ACETAMINOPHEN 650 MG RE SUPP: 650.0000 mg | Freq: Four times a day (QID) | RECTAL | Status: DC | PRN

## 2024-03-19 MED ORDER — METOPROLOL TARTRATE 50 MG PO TABS
50.0000 mg | ORAL_TABLET | Freq: Two times a day (BID) | ORAL | Status: DC
  Administered 2024-03-19 – 2024-03-28 (×17): 50 mg via ORAL
  Filled 2024-03-19 (×18): qty 1

## 2024-03-19 MED ORDER — ATORVASTATIN CALCIUM 10 MG PO TABS
20.0000 mg | ORAL_TABLET | Freq: Every morning | ORAL | Status: DC
  Administered 2024-03-21: 20 mg via ORAL
  Filled 2024-03-19 (×2): qty 2

## 2024-03-19 MED ORDER — BENZONATATE 100 MG PO CAPS
200.0000 mg | ORAL_CAPSULE | Freq: Three times a day (TID) | ORAL | Status: DC | PRN
  Administered 2024-03-21 – 2024-03-23 (×3): 200 mg via ORAL
  Filled 2024-03-19 (×4): qty 2

## 2024-03-19 MED ORDER — HYDRALAZINE HCL 10 MG PO TABS
10.0000 mg | ORAL_TABLET | Freq: Three times a day (TID) | ORAL | Status: DC
  Administered 2024-03-21 – 2024-03-28 (×20): 10 mg via ORAL
  Filled 2024-03-19 (×22): qty 1

## 2024-03-19 MED ORDER — ACETAMINOPHEN 325 MG PO TABS: 650.0000 mg | ORAL_TABLET | Freq: Four times a day (QID) | ORAL | Status: DC | PRN

## 2024-03-19 MED ORDER — SENNOSIDES-DOCUSATE SODIUM 8.6-50 MG PO TABS
1.0000 | ORAL_TABLET | Freq: Every day | ORAL | Status: DC | PRN
  Administered 2024-03-21: 1 via ORAL
  Filled 2024-03-19: qty 1

## 2024-03-19 MED ORDER — GUAIFENESIN ER 600 MG PO TB12
600.0000 mg | ORAL_TABLET | Freq: Two times a day (BID) | ORAL | Status: DC
  Administered 2024-03-19 – 2024-03-28 (×17): 600 mg via ORAL
  Filled 2024-03-19 (×18): qty 1

## 2024-03-19 MED ORDER — ONDANSETRON HCL 4 MG PO TABS: 4.0000 mg | ORAL_TABLET | Freq: Four times a day (QID) | ORAL | Status: DC | PRN

## 2024-03-19 MED ORDER — VANCOMYCIN HCL IN DEXTROSE 1-5 GM/200ML-% IV SOLN: 1000.0000 mg | Freq: Once | INTRAVENOUS | Status: DC

## 2024-03-19 MED ORDER — CALCIUM ACETATE (PHOS BINDER) 667 MG PO CAPS
1334.0000 mg | ORAL_CAPSULE | Freq: Three times a day (TID) | ORAL | Status: DC
  Administered 2024-03-21 – 2024-03-27 (×16): 1334 mg via ORAL
  Filled 2024-03-19 (×19): qty 2

## 2024-03-19 MED ORDER — PANTOPRAZOLE SODIUM 40 MG PO TBEC
40.0000 mg | DELAYED_RELEASE_TABLET | Freq: Every day | ORAL | Status: DC
  Administered 2024-03-21 – 2024-03-28 (×8): 40 mg via ORAL
  Filled 2024-03-19: qty 2
  Filled 2024-03-19 (×4): qty 1
  Filled 2024-03-19: qty 2
  Filled 2024-03-19 (×3): qty 1

## 2024-03-19 MED ORDER — AMPICILLIN-SULBACTAM SODIUM 3 (2-1) G IJ SOLR
3.0000 g | Freq: Once | INTRAMUSCULAR | Status: AC
  Administered 2024-03-19: 3 g via INTRAVENOUS
  Filled 2024-03-19: qty 8

## 2024-03-19 MED ORDER — DM-APAP-CPM 10-325-2 MG PO TABS: ORAL_TABLET | Freq: Every day | ORAL | Status: DC | PRN

## 2024-03-19 MED ORDER — ALBUTEROL SULFATE (2.5 MG/3ML) 0.083% IN NEBU: 2.5000 mg | INHALATION_SOLUTION | Freq: Four times a day (QID) | RESPIRATORY_TRACT | Status: DC | PRN

## 2024-03-19 MED ORDER — AMLODIPINE BESYLATE 10 MG PO TABS
10.0000 mg | ORAL_TABLET | Freq: Every morning | ORAL | Status: DC
  Administered 2024-03-21 – 2024-03-28 (×8): 10 mg via ORAL
  Filled 2024-03-19 (×2): qty 1
  Filled 2024-03-19: qty 2
  Filled 2024-03-19 (×6): qty 1

## 2024-03-19 MED ORDER — ONDANSETRON HCL 4 MG/2ML IJ SOLN
4.0000 mg | Freq: Four times a day (QID) | INTRAMUSCULAR | Status: DC | PRN
Start: 2024-03-19 — End: 2024-03-28
  Administered 2024-03-25: 4 mg via INTRAVENOUS

## 2024-03-19 MED ORDER — AMPICILLIN-SULBACTAM SODIUM 3 (2-1) G IJ SOLR: 3.0000 g | INTRAMUSCULAR | Status: DC

## 2024-03-19 MED ORDER — SODIUM CHLORIDE 0.9% FLUSH
3.0000 mL | Freq: Two times a day (BID) | INTRAVENOUS | Status: DC
  Administered 2024-03-19 – 2024-03-25 (×9): 3 mL via INTRAVENOUS

## 2024-03-19 MED ORDER — HEPARIN SODIUM (PORCINE) 5000 UNIT/ML IJ SOLN
5000.0000 [IU] | Freq: Three times a day (TID) | INTRAMUSCULAR | Status: DC
  Administered 2024-03-19 – 2024-03-27 (×21): 5000 [IU] via SUBCUTANEOUS
  Filled 2024-03-19 (×23): qty 1

## 2024-03-19 MED ORDER — VANCOMYCIN HCL IN DEXTROSE 1-5 GM/200ML-% IV SOLN: 1000.0000 mg | INTRAVENOUS | Status: DC

## 2024-03-19 MED ORDER — OXYCODONE-ACETAMINOPHEN 5-325 MG PO TABS
1.0000 | ORAL_TABLET | Freq: Four times a day (QID) | ORAL | Status: DC | PRN
  Administered 2024-03-21 – 2024-03-27 (×10): 1 via ORAL
  Filled 2024-03-19 (×10): qty 1

## 2024-03-19 MED ORDER — AMITRIPTYLINE HCL 10 MG PO TABS
10.0000 mg | ORAL_TABLET | Freq: Every day | ORAL | Status: DC
  Administered 2024-03-19 – 2024-03-27 (×8): 10 mg via ORAL
  Filled 2024-03-19 (×11): qty 1

## 2024-03-19 MED ORDER — GABAPENTIN 100 MG PO CAPS
100.0000 mg | ORAL_CAPSULE | Freq: Every day | ORAL | Status: DC
  Administered 2024-03-19 – 2024-03-27 (×9): 100 mg via ORAL
  Filled 2024-03-19 (×9): qty 1

## 2024-03-19 NOTE — ED Provider Notes (Signed)
 [place] EMERGENCY DEPARTMENT AT [facility] Provider Note   CSN: [id number] Arrival date & time: 03/19/24  1208     Patient presents with: Wound Infection   [name] [name] is a 63 y.o. male.   63 year old male with past medical history of diabetes and hypertension presenting to the emergency department today with concern for wound dehiscence.  The patient recently had his 2nd and 3rd toes amputated on his left foot.  Was seen in the emergency department at [facility] 2 days ago and had MRI that showed cellulitis but no osteomyelitis.  The patient has reportedly had bleeding from the wound as well as worsening redness despite taking doxycycline .  He followed up with podiatry today and was told to come back to the ER for IV antibiotics and possible further surgery.  The patient has been taking Percocet for pain.        Prior to Admission medications   Medication Sig Start Date End Date Taking? Authorizing Provider  Accu-Chek Softclix Lancets lancets  08/06/20   [provider]  ACETAMINOPHEN  PO Take 650 mg by mouth every 8 (eight) hours as needed for moderate pain (pain score 4-6).    [provider]  amitriptyline  (ELAVIL ) 10 MG tablet Take 10 mg by mouth at bedtime.    [provider]  amLODipine  (NORVASC ) 10 MG tablet Take 1 tablet (10 mg total) by mouth every morning. 03/29/23   Regalado, Belkys A, MD  aspirin  (ASPIRIN  CHILDRENS) 81 MG chewable tablet Chew 1 tablet (81 mg total) by mouth daily. 01/30/24 01/29/25  Serene Gaile ORN, MD  atorvastatin  (LIPITOR) 20 MG tablet Take 20 mg by mouth in the morning.    [provider]  AZITHROMYCIN  PO Take by mouth 2 (two) times daily with a meal.    [provider]  benzonatate  (TESSALON ) 200 MG capsule Take 200 mg by mouth 3 (three) times daily as needed for cough.    [provider]  calcium  acetate (PHOSLO) 667 MG capsule Take 1,334 mg by mouth 3 (three) times daily with  meals. 07/07/23 06/27/24  [provider]  clopidogrel  (PLAVIX ) 75 MG tablet Take 1 tablet (75 mg total) by mouth daily. 01/30/24   Serene Gaile ORN, MD  Cyanocobalamin  (VITAMIN B12 PO) Take 1 tablet by mouth at bedtime.    [provider]  DM-APAP-CPM (CORICIDIN HBP PO) Take 2 tablets by mouth daily as needed (congestion).    [provider]  doxycycline  (VIBRAMYCIN ) 100 MG capsule Take 1 capsule (100 mg total) by mouth 2 (two) times daily for 10 days. 03/17/24 03/27/24  Elnor Hila P, DO  gabapentin  (NEURONTIN ) 100 MG capsule Take 100 mg by mouth at bedtime. 07/05/23   [provider]  hydrALAZINE  (APRESOLINE ) 10 MG tablet Take 1 tablet (10 mg total) by mouth every 8 (eight) hours. 03/29/23   Regalado, Belkys A, MD  HYDROcodone -acetaminophen  (NORCO/VICODIN) 5-325 MG tablet Take 1 tablet by mouth every 8 (eight) hours as needed. 02/14/24   Gershon Donnice SAUNDERS, DPM  levothyroxine  (SYNTHROID ) 50 MCG tablet Take 50 mcg by mouth daily before breakfast.    [provider]  metoprolol  tartrate (LOPRESSOR ) 50 MG tablet Take 1 tablet (50 mg total) by mouth 2 (two) times daily. 06/16/20   Comer Kirsch, PA-C  mupirocin  ointment (BACTROBAN ) 2 % Apply 1 Application topically 2 (two) times daily. 11/23/23   Gershon Donnice SAUNDERS, DPM  mupirocin  ointment (BACTROBAN ) 2 % Apply 1 Application topically 2 (two) times  daily. 02/14/24   Gershon Donnice SAUNDERS, DPM  oxyCODONE -acetaminophen  (PERCOCET/ROXICET) 5-325 MG tablet Take 1 tablet by mouth every 6 (six) hours as needed for severe pain (pain score 7-10). 03/17/24   Elnor Hila P, DO  pantoprazole  (PROTONIX ) 40 MG tablet Take 1 tablet (40 mg total) by mouth daily. 30 minutes before breakfast 06/01/23   Shirlean Therisa ORN, NP  PRESCRIPTION MEDICATION     [provider]  Probiotic Product (PROBIOTIC DAILY PO) Take 1 capsule by mouth at bedtime.    [provider]  senna-docusate (SENOKOT-S) 8.6-50 MG tablet Take 1 tablet by  mouth daily as needed for mild constipation.    [provider]    Allergies: Patient has no known allergies.    Review of Systems  Skin:  Positive for wound.  All other systems reviewed and are negative.   Updated Vital Signs BP (!) 155/75   Pulse 61   Temp 98 F (36.7 C) (Oral)   Resp 18   SpO2 97%   Physical Exam Vitals and nursing note reviewed.   Gen: NAD Eyes: PERRL, EOMI HEENT: no oropharyngeal swelling Neck: trachea midline Resp: clear to auscultation bilaterally Card: RRR, no murmurs, rubs, or gallops Abd: nontender, nondistended Extremities: no calf tenderness, no edema, the patient does have erythema noted over the forefoot with dehiscence of his wound with his second metatarsal showing with minimal serosanguineous drainage Vascular: Palpable DP pulses bilaterally Skin: no rashes Psyc: acting appropriately   (all labs ordered are listed, but only abnormal results are displayed) Labs Reviewed  COMPREHENSIVE METABOLIC PANEL WITH GFR - Abnormal; Notable for the following components:      Result Value   Chloride 97 (*)    Glucose, Bld 117 (*)    Creatinine, Ser 4.43 (*)    Albumin  2.9 (*)    AST 14 (*)    GFR, Estimated 14 (*)    All other components within normal limits  CBC WITH DIFFERENTIAL/PLATELET - Abnormal; Notable for the following components:   RBC 3.32 (*)    Hemoglobin 10.2 (*)    HCT 33.1 (*)    Platelets 148 (*)    All other components within normal limits  CULTURE, BLOOD (ROUTINE X 2)  CULTURE, BLOOD (ROUTINE X 2)  I-STAT CG4 LACTIC ACID, ED    EKG: None  Radiology: MR FOOT LEFT W WO CONTRAST Result Date: 03/18/2024 MR FOOT WITHOUT THEN WITH IV CONTRAST COMPARISON: None. CLINICAL HISTORY: Reason amputation of the second and third toes with foot pain. PULSE SEQUENCES: Ax T1, Ax T2 FS, Sag T1, Sag T2 FS, Cor STIR, Cor T1 with and without IV contrast. FINDINGS: Examination is somewhat limited secondary to motion. Hallux rigidus  deformity of the first metatarsophalangeal joint. Moderate degenerative changes are seen in the fourth and fifth toes. Second and third toes have been amputated. There is a full-thickness wound at the level of the second metatarsal head with near full-thickness wound at the level of the third metatarsal head. The wound at the second metatarsal head appears to communicate with the bone. There is no destructive change or significant osteomyelitis involving the second metatarsal head at the current time. Patient is at significant increased risk due to exposure versus near exposure of the second metatarsal head. There is mild myositis. No significant tenosynovitis or tendinosis present. No drainable collection. IMPRESSION: Full-thickness wound at the tip of the second metatarsal head with either exposure or near exposure of the second metatarsal head. Patient is at risk  for developing osteomyelitis. No definitive osteomyelitis is identified at the current time. There is also wound adjacent to the third metatarsal head without full-thickness. Close follow-up suggested. Mild cellulitis without evidence of a drainable collection. Electronically signed by: Norleen Satchel MD 03/18/2024 09:13 AM EDT RP Workstation: [id number]     Procedures   Medications Ordered in the ED  Ampicillin-Sulbactam (UNASYN) 3 g in sodium chloride  0.9 % 100 mL IVPB (has no administration in time range)    And  vancomycin  (VANCOCIN ) IVPB 1000 mg/200 mL premix (has no administration in time range)                                    Medical Decision Making 63 year old male with past medical history of diabetes and recent toe amputation presenting to the emergency department today with wound dehiscence and drainage.  Will cover the patient with vancomycin  and Unasyn per antibiotic guidelines.  Will obtain blood cultures and basic labs and discuss with podiatry.  He will require admission.  I discussed the patient's case with podiatry.   They do recommend vascular consultation and a call is placed.  The patient does have trace pulses here and these are dopplerable with the DP and PT pulse on the left.  Calls placed to hospitalist service for admission.  Risk Prescription drug management. Decision regarding hospitalization.        Final diagnoses:  Wound infection after surgery    ED Discharge Orders     None          Ula Prentice SAUNDERS, MD 03/19/24 1544

## 2024-03-19 NOTE — ED Provider Triage Note (Signed)
 Emergency Medicine Provider Triage Evaluation Note  [name] [name] , a 63 y.o. male  was evaluated in triage.  Pt complains of worsening left foot pain and drainage.  Recent amputation of left 2nd and 3rd toe.  Seen at [facility] 2 days ago and had an MRI.  Saw podiatry today and recommended come to the ED for probable admission and possible further surgery.  Denies any fevers.  Review of Systems  Positive: Left foot pain Negative: Fever  Physical Exam  BP (!) 155/75   Pulse 61   Temp 98 F (36.7 C) (Oral)   Resp 18   SpO2 97%  Gen:   Awake, no distress   Resp:  Normal effort  MSK:   Left foot in wrap and postop shoe Other:    Medical Decision Making  Medically screening exam initiated at 12:42 PM.  Appropriate orders placed.  [name] [name] was informed that the remainder of the evaluation will be completed by another provider, this initial triage assessment does not replace that evaluation, and the importance of remaining in the ED until their evaluation is complete.     Towana Ozell BROCKS, MD 03/19/24 [phone]

## 2024-03-19 NOTE — Progress Notes (Signed)
 ABI and LLE arterial duplex exams have been completed.   Results can be found under chart review under CV PROC. 03/19/2024 6:29 PM Sharolyn Weber RVT, RDMS

## 2024-03-19 NOTE — H&P (Signed)
 History and Physical    Patient: [name] [name] FMW:[id number] DOB: [birth year] DOA: 03/19/2024 DOS: the patient was seen and examined on 03/19/2024 PCP: Kip Righter, MD  Patient coming from: Podiatry clinic  Chief Complaint:  Chief Complaint  Patient presents with   Wound Infection   HPI: [name] [name] is a 63 y.o. male with medical history significant of hypertension, hyperlipidemia, diabetes mellitus type 2, ESRD on HD, hypothyroidism who presents with post-operative foot pain. He is accompanied by his mother, who is also his caretaker.  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 Initially, there was no pain post-operatively until Saturday, when he began experiencing severe pain, which his mother describes that he 'screamed and hollered all night long.'  Patient noted that surgical site wound had opened up and he had redness present on the forefoot.  He was was seen at [facility] 2 days ago where he had a MRI of his left foot which noted full-thickness wound at the tip of the second metatarsal head with either exposure or near exposure of the second metatarsal head.  He was prescribed doxycycline  due to concern for an infection of his wound and oxycodone  for pain.  He notes that the oxycodone  did help some with his pain.    He is on a dialysis schedule of Monday, Wednesday, and Friday, with the last session being yesterday. He has a history of a fistula that clogged twice and was subsequently removed due to infection.  He experienced nausea and vomiting yesterday, which was associated with a severe cough and sinus drainage. He has a history of COVID-19, having contracted it twice, with the first episode being severe enough to keep him bedridden for a month.  He does not take any blood sugar medications due to his dialysis regimen, and his blood sugar was 123 this morning. No fevers or chills. Occasional nausea and vomiting, particularly  associated with coughing.  He followed up with Dr. Alona today and due to to his symptoms and was advised to come to the hospital for further evaluation and need of IV antibiotics  In the ED patient was noted to be afebrile but blood pressure was elevated up to 155/75, and all other vital signs maintained.  Labs noted WBC 9.5, hemoglobin 10.2, platelets 148, potassium 3.6, BUN 12, creatinine 4.43, and lactic acid 1.4.  Blood cultures were ordered and patient was started on empiric antibiotics of vancomycin  and ampicillin.  Vascular surgery have been formally consulted.  Review of Systems: As mentioned in the history of present illness. All other systems reviewed and are negative. Past Medical History:  Diagnosis Date   Anemia    Arthritis    Cardiomyopathy [facility])    July 2022   Diabetes mellitus [facility])    Dialysis patient    GERD (gastroesophageal reflux disease)    HTN (hypertension)    Hyperlipidemia    Hypothyroidism    Nephrotic syndrome    HD MWF   Obesity    Pulmonary hypertension (HCC)    July 2022 in setting of nephrotic syndrome   Secondary hyperparathyroidism of renal origin    Past Surgical History:  Procedure Laterality Date   ABDOMINAL AORTOGRAM W/LOWER EXTREMITY N/A 01/30/2024   Procedure: ABDOMINAL AORTOGRAM W/LOWER EXTREMITY;  Surgeon: Serene Gaile ORN, MD;  [facility]: [facility] INVASIVE CV LAB;  Service: Cardiovascular;  Laterality: N/A;   AMPUTATION TOE Left 02/14/2024   Procedure: AMPUTATION, TOE LEFT SECOND AND THIRD  TOES;  Surgeon: Gershon Donnice SAUNDERS, DPM;  Location: [facility] OR;  Service: Orthopedics/Podiatry;  Laterality: Left;   AV FISTULA PLACEMENT Left 12/27/2022   Procedure: INSERTION OF LEFT ARM ARTERIOVENOUS (AV) GORE-TEX GRAFT;  Surgeon: Eliza Lonni RAMAN, MD;  Location: [facility] OR;  Service: Vascular;  Laterality: Left;   AV FISTULA PLACEMENT Right 08/17/2023   Procedure: INSERTION OF ARTERIOVENOUS (AV) GORE-TEX GRAFT RIGHT ARM USING 4-35mm GORETEX STRETCH GRAFT;   Surgeon: Magda Debby SAILOR, MD;  Location: [facility] OR;  Service: Vascular;  Laterality: Right;   AVGG REMOVAL Left 03/15/2023   Procedure: REMOVAL OF LEFT UPPER ARM ARTERIOVENOUS GORETEX GRAFT (AVGG);  Surgeon: Magda Debby SAILOR, MD;  Location: [facility] OR;  Service: Vascular;  Laterality: Left;   BIOPSY  01/13/2023   Procedure: BIOPSY;  Surgeon: Cinderella Deatrice FALCON, MD;  Location: [facility] ENDO SUITE;  Service: Endoscopy;;   CATARACT EXTRACTION, BILATERAL  2018   COLONOSCOPY WITH PROPOFOL  N/A 01/13/2023   Procedure: COLONOSCOPY WITH PROPOFOL ;  Surgeon: Cinderella Deatrice FALCON, MD;  Location: [facility]O [facility];  Service: Endoscopy;  Laterality: N/A;   DIALYSIS/PERMA CATHETER INSERTION Right 01/10/2024   Procedure: DIALYSIS/PERMA CATHETER INSERTION;  Surgeon: Gretta Lonni PARAS, MD;  Location: [facility] LAB;  Service: Cardiovascular;  Laterality: Right;   INSERTION OF DIALYSIS CATHETER Right 12/27/2022   Procedure: INSERTION OF Right Internal Jugular  DIALYSIS CATHETER;  Surgeon: Eliza Lonni RAMAN, MD;  Location: [facility] OR;  Service: Vascular;  Laterality: Right;   INSERTION OF DIALYSIS CATHETER N/A 03/15/2023   Procedure: INSERTION OF TUNNELED DIALYSIS CATHETER;  Surgeon: Magda Debby SAILOR, MD;  Location: [facility] OR;  Service: Vascular;  Laterality: N/A;   INSERTION OF DIALYSIS CATHETER Right 03/25/2023   Procedure: INSERTION OF RIGHT TUNNELED DIALYSIS CATHETER;  Surgeon: Pearline Norman RAMAN, MD;  Location: [facility] OR;  Service: Vascular;  Laterality: Right;   LOWER EXTREMITY INTERVENTION  01/30/2024   Procedure: LOWER EXTREMITY INTERVENTION;  Surgeon: Serene Gaile ORN, MD;  Location: [facility];  Service: Cardiovascular;;   PERIPHERAL INTRAVASCULAR LITHOTRIPSY  01/30/2024   Procedure: PERIPHERAL INTRAVASCULAR LITHOTRIPSY;  Surgeon: Serene Gaile ORN, MD;  Location: [facility];  Service: Cardiovascular;;   PERIPHERAL VASCULAR ATHERECTOMY  01/30/2024   Procedure: PERIPHERAL VASCULAR ATHERECTOMY;  Surgeon: Serene Gaile ORN, MD;   Location: [facility];  Service: Cardiovascular;;   PERIPHERAL VASCULAR THROMBECTOMY Right 12/20/2023   Procedure: PERIPHERAL VASCULAR THROMBECTOMY;  Surgeon: Pearline Norman RAMAN, MD;  Location: [facility];  Service: Cardiovascular;  Laterality: Right;   TEE WITHOUT CARDIOVERSION N/A 03/20/2023   Procedure: TRANSESOPHAGEAL ECHOCARDIOGRAM;  Surgeon: Barbaraann Darryle Debby, MD;  Location: [facility] OR;  Service: Cardiovascular;  Laterality: N/A;   TONSILLECTOMY     VENOUS ANGIOPLASTY  12/20/2023   Procedure: VENOUS ANGIOPLASTY;  Surgeon: Pearline Norman RAMAN, MD;  Location: [facility];  Service: Cardiovascular;;   VENOUS STENT  12/20/2023   Procedure: VENOUS STENT;  Surgeon: Pearline Norman RAMAN, MD;  Location: [facility];  Service: Cardiovascular;;  outflow   Social History:  reports that he has never smoked. He has never used smokeless tobacco. He reports that he does not drink alcohol and does not use drugs.  No Known Allergies  Family History  Problem Relation Age of Onset   Hypertension Mother    Diabetes Mother    Cancer Father        prostate cancer   Hypertension Maternal Grandfather    Rheum arthritis Maternal Grandmother    CAD Neg Hx  Prior to Admission medications   Medication Sig Start Date End Date Taking? Authorizing Provider  Accu-Chek Softclix Lancets lancets  08/06/20   [provider]  ACETAMINOPHEN  PO Take 650 mg by mouth every 8 (eight) hours as needed for moderate pain (pain score 4-6).    [provider]  amitriptyline  (ELAVIL ) 10 MG tablet Take 10 mg by mouth at bedtime.    [provider]  amLODipine  (NORVASC ) 10 MG tablet Take 1 tablet (10 mg total) by mouth every morning. 03/29/23   Regalado, Belkys A, MD  aspirin  (ASPIRIN  CHILDRENS) 81 MG chewable tablet Chew 1 tablet (81 mg total) by mouth daily. 01/30/24 01/29/25  Serene Gaile ORN, MD  atorvastatin  (LIPITOR) 20 MG tablet Take 20 mg by mouth in the morning.    [provider]   AZITHROMYCIN  PO Take by mouth 2 (two) times daily with a meal.    [provider]  benzonatate  (TESSALON ) 200 MG capsule Take 200 mg by mouth 3 (three) times daily as needed for cough.    [provider]  calcium  acetate (PHOSLO) 667 MG capsule Take 1,334 mg by mouth 3 (three) times daily with meals. 07/07/23 06/27/24  [provider]  clopidogrel  (PLAVIX ) 75 MG tablet Take 1 tablet (75 mg total) by mouth daily. 01/30/24   Serene Gaile ORN, MD  Cyanocobalamin  (VITAMIN B12 PO) Take 1 tablet by mouth at bedtime.    [provider]  DM-APAP-CPM (CORICIDIN HBP PO) Take 2 tablets by mouth daily as needed (congestion).    [provider]  doxycycline  (VIBRAMYCIN ) 100 MG capsule Take 1 capsule (100 mg total) by mouth 2 (two) times daily for 10 days. 03/17/24 03/27/24  Elnor Hila P, DO  gabapentin  (NEURONTIN ) 100 MG capsule Take 100 mg by mouth at bedtime. 07/05/23   [provider]  hydrALAZINE  (APRESOLINE ) 10 MG tablet Take 1 tablet (10 mg total) by mouth every 8 (eight) hours. 03/29/23   Regalado, Belkys A, MD  HYDROcodone -acetaminophen  (NORCO/VICODIN) 5-325 MG tablet Take 1 tablet by mouth every 8 (eight) hours as needed. 02/14/24   Gershon Donnice SAUNDERS, DPM  levothyroxine  (SYNTHROID ) 50 MCG tablet Take 50 mcg by mouth daily before breakfast.    [provider]  metoprolol  tartrate (LOPRESSOR ) 50 MG tablet Take 1 tablet (50 mg total) by mouth 2 (two) times daily. 06/16/20   Comer Kirsch, PA-C  mupirocin  ointment (BACTROBAN ) 2 % Apply 1 Application topically 2 (two) times daily. 11/23/23   Gershon Donnice SAUNDERS, DPM  mupirocin  ointment (BACTROBAN ) 2 % Apply 1 Application topically 2 (two) times daily. 02/14/24   Gershon Donnice SAUNDERS, DPM  oxyCODONE -acetaminophen  (PERCOCET/ROXICET) 5-325 MG tablet Take 1 tablet by mouth every 6 (six) hours as needed for severe pain (pain score 7-10). 03/17/24   Elnor Hila P, DO  pantoprazole  (PROTONIX ) 40 MG tablet Take 1  tablet (40 mg total) by mouth daily. 30 minutes before breakfast 06/01/23   Shirlean Therisa ORN, NP  PRESCRIPTION MEDICATION     [provider]  Probiotic Product (PROBIOTIC DAILY PO) Take 1 capsule by mouth at bedtime.    [provider]  senna-docusate (SENOKOT-S) 8.6-50 MG tablet Take 1 tablet by mouth daily as needed for mild constipation.    [provider]    Physical Exam: Vitals:   03/19/24 1216 03/19/24 1219  BP:  (!) 155/75  Pulse:  61  Resp:  18  Temp: 98 F (36.7 C)   TempSrc: Oral   SpO2:  97%  Constitutional: Elderly adult male currently in no acute distress Eyes: PERRL, lids and conjunctivae normal ENMT: Mucous membranes are moist..  Dentition Neck: normal, supple, no masses, no thyromegaly Respiratory: clear to auscultation bilaterally, no wheezing, no crackles. Normal respiratory effort. No accessory muscle use.  Cardiovascular: Regular rate and rhythm, no murmurs / rubs / gallops.  No lower extremity edema.  Right upper extremity fistula without palpable thrill.  Right upper chest hemodialysis catheter in place Abdomen: no tenderness, no masses palpated.  Bowel sounds positive.  Musculoskeletal: no clubbing / cyanosis.  Status post amputation of the 2nd and 3rd digits of the left foot. Skin: Erythema present of the forefoot with open wound and presence of bone  Neurologic: CN 2-12 grossly intact.  Strength 5/5 in all 4.  Psychiatric: Normal judgment and insight. Alert and oriented x 3. Normal mood.   Data Reviewed:  reviewed labs, imaging, and pertinent records as documented.  Assessment and Plan:  Postoperative wound infection Patient presents with increasing pain and erythema of his postoperative site after recent 2nd and 3rd left toe amputation due to gangrene on 02/14/2024 by Dr. Alona.  MRI noted noted full-thickness wound at the tip of the second metatarsal head with either exposure or near exposure of the second metatarsal head  without.  Patient had been started on doxycycline . - Admit to a medical telemetry - Follow-up blood cultures - Check ESR and CRP - Hold doxycycline  - Continue empiric antibiotics of vancomycin  and ampicillin - Oxycodone  as needed for pain - Podiatry consulted, will follow-up for any further recommendations  ESRD on HD Patient dialyzes Monday, Wednesday, Friday.  Last hemodialysis session on 10/6.  Labs noted potassium 3.6, CO2 25, BUN 12, and creatinine 4.43. - Nephrology consulted for need of dialysis in a.m.  Peripheral vascular disease - Held aspirin  and Plavix  in case of need of procedure  Anemia of chronic disease Chronic.  Hemoglobin noted to be 10.2 which appears similar to prior - Continue to monitor  Thrombocytopenia Platelet count 148 which appears similar to most recent checks. - Continue to monitor  Essential hypertension Blood pressure elevated at 155/75 - Continue metoprolol  and amlodipine   Controlled diabetes mellitus type 2 without long-term use of insulin  Last hemoglobin A1c noted to be 5.1.  Patient is not on any diabetic medications at baseline. - Continue renal and carb modified diet - Continue gabapentin   Hypothyroidism - Continue levothyroxine   Hyperlipidemia - Continue atorvastatin    GERD - Continue PPI  DVT prophylaxis: Heparin  Advance Care Planning:   Code Status: Full Code   Consults: Podiatry  Family Communication: Patient's mother updated at bedside  Severity of Illness: The appropriate patient status for this patient is INPATIENT. Inpatient status is judged to be reasonable and necessary in order to provide the required intensity of service to ensure the patient's safety. The patient's presenting symptoms, physical exam findings, and initial radiographic and laboratory data in the context of their chronic comorbidities is felt to place them at high risk for further clinical deterioration. Furthermore, it is not anticipated that the  patient will be medically stable for discharge from the hospital within 2 midnights of admission.   * I certify that at the point of admission it is my clinical judgment that the patient will require inpatient hospital care spanning beyond 2 midnights from the point of admission due to high intensity of service, high risk for further deterioration and high frequency of surveillance required.*  Author: Maximino DELENA Sharps, MD 03/19/2024 3:53 PM  For on call review [email or web address].

## 2024-03-19 NOTE — ED Triage Notes (Signed)
 Pt reports he had amputation of a few toes a couple weeks ago and had to go to [facility] due to increased pain. Reports medication they gave him helped. States he is here today to get it checked out.

## 2024-03-19 NOTE — Progress Notes (Signed)
 Patient arrived to the unit and I got him into the bed and got him changed into a hospital gown. Gave report to night shift nurse and notified him that the patient's admission documentation needed to be completed.

## 2024-03-19 NOTE — ED Notes (Signed)
 Unsucuessful iv attempt

## 2024-03-19 NOTE — Consult Note (Addendum)
 Hospital Consult    Reason for Consult:  nonhealing left toe amputation Requesting Physician:  ED MRN #:  [id number]  History of Present Illness: [name] [name] is a 63 y.o. male with a PMH of arthritis, ESRD on HD M/W/F, DMII, HTN, HLD, and pulmonary hypertension who presents to the ED today with a nonhealing amputation site of his left 2nd and 3rd toes.  We were consulted for vascular evaluation.  The patient recently underwent left lower extremity angiogram with left anterior tibial artery laser atherectomy, shockwave lithotripsy, and balloon angioplasty on 01/30/2024.  Upon angiogram completion he had three-vessel runoff to the left ankle, with the anterior tibial being the dominant one.  He subsequently underwent amputation of his left 2nd and 3rd toes on 02/19/2024 by Dr. Gershon.   He presented to his podiatrist office today with worsening appearance of his amputation site.  The patient says that this area has slowly started opening up over the past week.  He says that he started having bleeding from his amputation site on Saturday.  He denies any fevers.  He denies any rest pain in the foot.  He says that it does hurt him to walk on his foot.  He does not feel like the ulcer on his left great toe has changed at all.  Past Medical History:  Diagnosis Date   Anemia    Arthritis    Cardiomyopathy [facility])    July 2022   Diabetes mellitus [facility])    Dialysis patient    GERD (gastroesophageal reflux disease)    HTN (hypertension)    Hyperlipidemia    Hypothyroidism    Nephrotic syndrome    HD MWF   Obesity    Pulmonary hypertension (HCC)    July 2022 in setting of nephrotic syndrome   Secondary hyperparathyroidism of renal origin     Past Surgical History:  Procedure Laterality Date   ABDOMINAL AORTOGRAM W/LOWER EXTREMITY N/A 01/30/2024   Procedure: ABDOMINAL AORTOGRAM W/LOWER EXTREMITY;  Surgeon: Serene Gaile ORN, MD;  [facility];  Service: Cardiovascular;   Laterality: N/A;   AMPUTATION TOE Left 02/14/2024   Procedure: AMPUTATION, TOE LEFT SECOND AND THIRD TOES;  Surgeon: Gershon Donnice SAUNDERS, DPM;  Location: [facility] OR;  Service: Orthopedics/Podiatry;  Laterality: Left;   AV FISTULA PLACEMENT Left 12/27/2022   Procedure: INSERTION OF LEFT ARM ARTERIOVENOUS (AV) GORE-TEX GRAFT;  Surgeon: Eliza Lonni RAMAN, MD;  Location: [facility] OR;  Service: Vascular;  Laterality: Left;   AV FISTULA PLACEMENT Right 08/17/2023   Procedure: INSERTION OF ARTERIOVENOUS (AV) GORE-TEX GRAFT RIGHT ARM USING 4-58mm GORETEX STRETCH GRAFT;  Surgeon: Magda Debby SAILOR, MD;  Location: [facility] OR;  Service: Vascular;  Laterality: Right;   AVGG REMOVAL Left 03/15/2023   Procedure: REMOVAL OF LEFT UPPER ARM ARTERIOVENOUS GORETEX GRAFT (AVGG);  Surgeon: Magda Debby SAILOR, MD;  Location: [facility] OR;  Service: Vascular;  Laterality: Left;   BIOPSY  01/13/2023   Procedure: BIOPSY;  Surgeon: Cinderella Deatrice FALCON, MD;  Location: [facility] ENDO [facility];  Service: Endoscopy;;   CATARACT EXTRACTION, BILATERAL  2018   COLONOSCOPY WITH PROPOFOL  N/A 01/13/2023   Procedure: COLONOSCOPY WITH PROPOFOL ;  Surgeon: Cinderella Deatrice FALCON, MD;  Location: [facility];  Service: Endoscopy;  Laterality: N/A;   DIALYSIS/PERMA CATHETER INSERTION Right 01/10/2024   Procedure: DIALYSIS/PERMA CATHETER INSERTION;  Surgeon: Gretta Lonni PARAS, MD;  Location: [facility];  Service: Cardiovascular;  Laterality: Right;   INSERTION OF DIALYSIS CATHETER Right 12/27/2022   Procedure: INSERTION OF  Right Internal Jugular  DIALYSIS CATHETER;  Surgeon: Eliza Lonni RAMAN, MD;  Location: [facility] OR;  Service: Vascular;  Laterality: Right;   INSERTION OF DIALYSIS CATHETER N/A 03/15/2023   Procedure: INSERTION OF TUNNELED DIALYSIS CATHETER;  Surgeon: Magda Debby SAILOR, MD;  [facility];  Service: Vascular;  Laterality: N/A;   INSERTION OF DIALYSIS CATHETER Right 03/25/2023   Procedure: INSERTION OF RIGHT TUNNELED DIALYSIS CATHETER;  Surgeon: Pearline Norman RAMAN, MD;  Location: [facility] OR;  Service: Vascular;  Laterality: Right;   LOWER EXTREMITY INTERVENTION  01/30/2024   Procedure: LOWER EXTREMITY INTERVENTION;  Surgeon: Serene Gaile ORN, MD;  [facility];  Service: Cardiovascular;;   PERIPHERAL INTRAVASCULAR LITHOTRIPSY  01/30/2024   Procedure: PERIPHERAL INTRAVASCULAR LITHOTRIPSY;  Surgeon: Serene Gaile ORN, MD;  [facility];  Service: Cardiovascular;;   PERIPHERAL VASCULAR ATHERECTOMY  01/30/2024   Procedure: PERIPHERAL VASCULAR ATHERECTOMY;  Surgeon: Serene Gaile ORN, MD;  [facility];  Service: Cardiovascular;;   PERIPHERAL VASCULAR THROMBECTOMY Right 12/20/2023   Procedure: PERIPHERAL VASCULAR THROMBECTOMY;  Surgeon: Pearline Norman RAMAN, MD;  Location: [facility];  Service: Cardiovascular;  Laterality: Right;   TEE WITHOUT CARDIOVERSION N/A 03/20/2023   Procedure: TRANSESOPHAGEAL ECHOCARDIOGRAM;  Surgeon: Barbaraann Darryle Debby, MD;  Location: [facility] OR;  Service: Cardiovascular;  Laterality: N/A;   TONSILLECTOMY     VENOUS ANGIOPLASTY  12/20/2023   Procedure: VENOUS ANGIOPLASTY;  Surgeon: Pearline Norman RAMAN, MD;  Location: [facility];  Service: Cardiovascular;;   VENOUS STENT  12/20/2023   Procedure: VENOUS STENT;  Surgeon: Pearline Norman RAMAN, MD;  Location: [facility];  Service: Cardiovascular;;  outflow    No Known Allergies  Prior to Admission medications   Medication Sig Start Date End Date Taking? Authorizing Provider  ACETAMINOPHEN  PO Take 650 mg by mouth every 8 (eight) hours as needed for moderate pain (pain score 4-6).   Yes [provider]  amitriptyline  (ELAVIL ) 10 MG tablet Take 10 mg by mouth at bedtime.   Yes [provider]  amLODipine  (NORVASC ) 10 MG tablet Take 1 tablet (10 mg total) by mouth every morning. 03/29/23  Yes Regalado, Belkys A, MD  aspirin  (ASPIRIN  CHILDRENS) 81 MG chewable tablet Chew 1 tablet (81 mg total) by mouth daily. 01/30/24 01/29/25 Yes Serene Gaile ORN,  MD  atorvastatin  (LIPITOR) 20 MG tablet Take 20 mg by mouth in the morning.   Yes [provider]  benzonatate  (TESSALON ) 200 MG capsule Take 200 mg by mouth 3 (three) times daily as needed for cough.   Yes [provider]  calcium  acetate (PHOSLO) 667 MG capsule Take 1,334 mg by mouth 3 (three) times daily with meals. 07/07/23 06/27/24 Yes [provider]  clopidogrel  (PLAVIX ) 75 MG tablet Take 1 tablet (75 mg total) by mouth daily. 01/30/24  Yes Serene Gaile ORN, MD  Cyanocobalamin  (VITAMIN B12 PO) Take 1 tablet by mouth at bedtime.   Yes [provider]  DM-APAP-CPM (CORICIDIN HBP PO) Take 2 tablets by mouth daily as needed (congestion).   Yes [provider]  doxycycline  (VIBRAMYCIN ) 100 MG capsule Take 1 capsule (100 mg total) by mouth 2 (two) times daily for 10 days. 03/17/24 03/27/24 Yes Elnor Hila P, DO  gabapentin  (NEURONTIN ) 100 MG capsule Take 100 mg by mouth at bedtime. 07/05/23  Yes [provider]  levothyroxine  (SYNTHROID ) 50 MCG tablet Take 50 mcg by mouth daily before breakfast.   Yes [provider]  metoprolol   tartrate (LOPRESSOR ) 50 MG tablet Take 1 tablet (50 mg total) by mouth 2 (two) times daily. 06/16/20  Yes Comer Kirsch, PA-C  mupirocin  ointment (BACTROBAN ) 2 % Apply 1 Application topically 2 (two) times daily. 11/23/23  Yes Gershon Donnice SAUNDERS, DPM  oxyCODONE -acetaminophen  (PERCOCET/ROXICET) 5-325 MG tablet Take 1 tablet by mouth every 6 (six) hours as needed for severe pain (pain score 7-10). 03/17/24  Yes Elnor Hila P, DO  pantoprazole  (PROTONIX ) 40 MG tablet Take 1 tablet (40 mg total) by mouth daily. 30 minutes before breakfast 06/01/23  Yes Shirlean Therisa ORN, NP  Probiotic Product (PROBIOTIC DAILY PO) Take 1 capsule by mouth at bedtime.   Yes [provider]  senna-docusate (SENOKOT-S) 8.6-50 MG tablet Take 1 tablet by mouth daily as needed for mild constipation.   Yes [provider]  Accu-Chek  Softclix Lancets lancets  08/06/20   [provider]  AZITHROMYCIN  PO Take by mouth 2 (two) times daily with a meal.    [provider]  hydrALAZINE  (APRESOLINE ) 10 MG tablet Take 1 tablet (10 mg total) by mouth every 8 (eight) hours. Patient not taking: Reported on 03/19/2024 03/29/23   Regalado, Owen A, MD  HYDROcodone -acetaminophen  (NORCO/VICODIN) 5-325 MG tablet Take 1 tablet by mouth every 8 (eight) hours as needed. Patient not taking: Reported on 03/19/2024 02/14/24   Gershon Donnice SAUNDERS, DPM    Social History   Socioeconomic History   Marital status: Single    Spouse name: Not on file   Number of children: Not on file   Years of education: Not on file   Highest education level: Not on file  Occupational History   Not on file  Tobacco Use   Smoking status: Never   Smokeless tobacco: Never  Vaping Use   Vaping status: Never Used  Substance and Sexual Activity   Alcohol use: Never   Drug use: Never   Sexual activity: Not on file  Other Topics Concern   Not on file  Social History Narrative   Not on file   Social Drivers of Health   Financial Resource Strain: Not on file  Food Insecurity: No Food Insecurity (03/06/2023)   Hunger Vital Sign    Worried About Running Out of Food in the Last Year: Never true    Ran Out of Food in the Last Year: Never true  Transportation Needs: No Transportation Needs (03/06/2023)   PRAPARE - Administrator, Civil Service (Medical): No    Lack of Transportation (Non-Medical): No  Physical Activity: Not on file  Stress: Not on file  Social Connections: Not on file  Intimate Partner Violence: Not At Risk (03/06/2023)   Humiliation, Afraid, Rape, and Kick questionnaire    Fear of Current or Ex-Partner: No    Emotionally Abused: No    Physically Abused: No    Sexually Abused: No    Family History  Problem Relation Age of Onset   Hypertension Mother    Diabetes Mother    Cancer Father        prostate cancer    Hypertension Maternal Grandfather    Rheum arthritis Maternal Grandmother    CAD Neg Hx     ROS: Otherwise negative unless mentioned in HPI  Physical Examination  Vitals:   03/19/24 1216 03/19/24 1219  BP:  (!) 155/75  Pulse:  61  Resp:  18  Temp: 98 F (36.7 C)   SpO2:  97%   There is no height or weight  on file to calculate BMI.  General:  WDWN in NAD Gait: Not observed HENT: WNL, normocephalic Pulmonary: normal non-labored breathing Cardiac: regular Abdomen:  soft, NT/ND, no masses Skin: without rashes Vascular Exam/Pulses: Palpable femoral pulses bilaterally.  Palpable left popliteal pulse.  Nonpalpable left DP/PT pulses.  Intact left DP/PT/peroneal Doppler signals Extremities: Dehisced left 2nd and 3rd toe amputation site with visible bone and purulence in the wound bed.  Dry ulceration to the left great toe Musculoskeletal: no muscle wasting or atrophy  Neurologic: A&O X 3;  No focal weakness or paresthesias are detected; speech is fluent/normal Psychiatric:  The pt has Normal affect. Lymph:  Unremarkable   CBC    Component Value Date/Time   WBC 9.5 03/19/2024 1309   RBC 3.32 (L) 03/19/2024 1309   HGB 10.2 (L) 03/19/2024 1309   HCT 33.1 (L) 03/19/2024 1309   PLT 148 (L) 03/19/2024 1309   MCV 99.7 03/19/2024 1309   MCH 30.7 03/19/2024 1309   MCHC 30.8 03/19/2024 1309   RDW 14.7 03/19/2024 1309   LYMPHSABS 1.4 03/19/2024 1309   MONOABS 0.6 03/19/2024 1309   EOSABS 0.1 03/19/2024 1309   BASOSABS 0.1 03/19/2024 1309    BMET    Component Value Date/Time   NA 137 03/19/2024 1309   K 3.6 03/19/2024 1309   CL 97 (L) 03/19/2024 1309   CO2 25 03/19/2024 1309   GLUCOSE 117 (H) 03/19/2024 1309   BUN 12 03/19/2024 1309   CREATININE 4.43 (H) 03/19/2024 1309   CALCIUM  8.9 03/19/2024 1309   GFRNONAA 14 (L) 03/19/2024 1309   GFRAA 48 (L) 01/31/2020 1216    COAGS: Lab Results  Component Value Date   INR 1.1 05/19/2023   INR 1.1 03/06/2023   INR 1.1  08/30/2020     Non-Invasive Vascular Imaging:   ABIs and LLE arterial duplex pending   ASSESSMENT/PLAN: This is a 63 y.o. male admitted with nonhealing toe amputation   - The patient has a history of left lower extremity critical limb ischemia with gangrenous changes to his left 2nd and 3rd toes.  He also has a history of dry ulceration to his left great toe.  He recently underwent left lower extremity angiogram with laser atherectomy, lithotripsy, and balloon angioplasty of the left anterior tibial artery on 8/19.  He subsequently underwent amputation of his left 2nd and 3rd toes on 9/3. - He presents to the ED today with worsening appearance of his amputation site.  The patient states that this amputation site started opening up about a week ago.  He endorses bleeding from this area since Saturday.  He says that his left foot has also hurt to walk on since surgery.  He denies any fevers or chills - On exam he has palpable femoral pulses bilaterally.  He has a palpable left popliteal pulse.  He has nonpalpable pedal pulses on the left.  He does have intact left DP/PT/peroneal Doppler signals.  He has complete dehiscence of his left 2nd and 3rd toe amputation site with bone visible in the wound bed.  He also appears to have surrounding cellulitis. -Dr.Aziz Slape and I have both evaluated the patient.  We have explained to the patient that unfortunately his blood flow in the left foot was not enough to heal his amputation even after angiogram.  This is due to small vessel disease in the foot.  We have offered the patient 2 options, including repeat left left lower extremity angiogram versus foregoing angiogram and proceeding with more  proximal amputation.  He would at least need a left TMA. - I have ordered ABIs and left lower extremity arterial duplex for the patient.  At this time the patient would like to proceed with repeat angiogram.  We will tentatively schedule the patient for left lower extremity  angiogram in the Cath Lab tomorrow with Dr. Lanis.  Please make n.p.o. at midnight   [facility] PA-C [facility] [phone]    I have interviewed and examined patient with PA and agree with assessment and plan above.  Unfortunately he has not had adequate healing of the toe amputation site despite revascularization prior to surgical intervention.  We discussed that this places him at very high risk for major amputation of the left lower extremity.  As it stands he would at least require resection of more of his metatarsal bone versus a transmetatarsal amputation on the left.  ABIs have not been reevaluated after revascularization.  ABIs and duplex ordered.  Will plan for angiography with possible intervention tomorrow in the Cath Lab.  [name] Markwood C. Sheree, MD [facility] [place] Office: [phone] Pager: [phone]

## 2024-03-19 NOTE — Progress Notes (Signed)
 Pharmacy Antibiotic Note  [name] [name] is a 63 y.o. male admitted on 03/19/2024 with skin/soft tissue infection of L foot.  Pharmacy has been consulted for vancomycin  and ampicillin/sulbactam dosing.  Seen at [facility] ED 2 days ago and received MRI that showed cellulitis but no osteomyelitis. Redness and worsening cellulitis occurred despite taking doxycyline outpatient. Patient is afebrile; WBC wnl. Patient with ESRD on HD outpatient schedule MWF.   1-time doses of Unasyn 3g and vancomycin  1000 mg have been ordered but not yet charted as given as of 16:30.   Plan: -Give vancomycin  1000 mg qHD session.Monitor plans for HD schedule while inpatient.  -Give Unasyn 3g Q24H each evening (should be post-dialysis on HD days).  -Monitor s/sx infection, cultures as available -Obtain vancomycin  levels as indicated   Temp (24hrs), Avg:98.2 F (36.8 C), Min:98 F (36.7 C), Max:98.4 F (36.9 C)  Recent Labs  Lab 03/17/24 1701 03/19/24 1309 03/19/24 1327  WBC 11.5* 9.5  --   CREATININE 5.94* 4.43*  --   LATICACIDVEN  --   --  1.4    Estimated Creatinine Clearance: 17.2 mL/min (A) (by C-G formula based on SCr of 4.43 mg/dL (H)).    No Known Allergies  Antimicrobials this admission: Ampicillin-sulbactam 10/7>> Vancomycin  10/7>>  Dose adjustments this admission: None  Microbiology results: 10/7 BCx: pending  Thank you for allowing pharmacy to be a part of this patient's care.  Maurilio Patten, PharmD PGY1 Pharmacy Resident [facility] 03/19/2024 4:36 PM

## 2024-03-19 NOTE — Progress Notes (Signed)
  Subjective:  Patient ID: [name] [name], male    DOB: 04/13/1961,  MRN: [id number]  No chief complaint on file.    DOS: 02/14/24 Procedure: Left 2nd and 3rd toe amputation   63 y.o. male returns for post-op check.  This is over the leaking site and significant increased pain with significant again.  MRI was performed.  He is starting doxycycline  given pain medication.  He presents today for follow-up evaluation.  No Known Allergies Objective:  There were no vitals filed for this visit. Body mass index is 24.68 kg/m. Constitutional Well developed. Well nourished.  Vascular Foot warm and well perfused. Capillary refill normal to all digits.  Capillary refill intact about the surgical site.  Neurologic Normal speech. Oriented to person, place, and time. Epicritic sensation to light touch grossly present bilaterally.  Dermatologic Complete dehiscence noted along the amputation site with exposed metatarsal head there is localized edema and erythema present.  Patient purulence noted from the wound.  Orthopedic: No significant tenderness to palpation noted about the surgical site.    Assessment:   1. Gangrene (HCC)   2. S/P amputation of lesser toe, left (HCC)   3. Ulcer of great toe, right, limited to breakdown of skin [facility])    Plan:  Patient was evaluated and treated and all questions answered.  S/p foot surgery left 2nd and 3rd toe amputation secondary to gangrene -Lacey is complete dehiscence of the wound.  given the increased pain and concern for infection and exposed bone recommended the patient to return to the emergency room.  He will need to likely be admitted.  He recently had an MRI done yesterday.  Would recommend vascular evaluation.  I will also contact Dr. Malvin to let them know of his arrival.  I discussed further surgery the patient including likely TMA, versus resection of the more metatarsal.  Patient states he does not want the foot removed but discussed  this is likely his best option.  No follow-ups on file.  [name] Fees DPM

## 2024-03-20 ENCOUNTER — Encounter (HOSPITAL_COMMUNITY): Admission: EM | Disposition: A | Payer: Self-pay | Source: Home / Self Care | Attending: Family Medicine

## 2024-03-20 DIAGNOSIS — M7989 Other specified soft tissue disorders: Secondary | ICD-10-CM

## 2024-03-20 DIAGNOSIS — T8131XA Disruption of external operation (surgical) wound, not elsewhere classified, initial encounter: Secondary | ICD-10-CM

## 2024-03-20 DIAGNOSIS — E1151 Type 2 diabetes mellitus with diabetic peripheral angiopathy without gangrene: Secondary | ICD-10-CM | POA: Diagnosis not present

## 2024-03-20 DIAGNOSIS — I70222 Atherosclerosis of native arteries of extremities with rest pain, left leg: Secondary | ICD-10-CM

## 2024-03-20 DIAGNOSIS — I70292 Other atherosclerosis of native arteries of extremities, left leg: Secondary | ICD-10-CM | POA: Diagnosis not present

## 2024-03-20 DIAGNOSIS — I509 Heart failure, unspecified: Secondary | ICD-10-CM

## 2024-03-20 DIAGNOSIS — M86172 Other acute osteomyelitis, left ankle and foot: Secondary | ICD-10-CM

## 2024-03-20 DIAGNOSIS — T8789 Other complications of amputation stump: Secondary | ICD-10-CM

## 2024-03-20 DIAGNOSIS — T8149XA Infection following a procedure, other surgical site, initial encounter: Secondary | ICD-10-CM | POA: Diagnosis not present

## 2024-03-20 HISTORY — PX: ABDOMINAL AORTOGRAM W/LOWER EXTREMITY: CATH118223

## 2024-03-20 LAB — C-REACTIVE PROTEIN: CRP: 15.7 mg/dL — ABNORMAL HIGH (ref <1.0)

## 2024-03-20 LAB — RESPIRATORY PANEL BY PCR

## 2024-03-20 LAB — AEROBIC CULTURE W GRAM STAIN (SUPERFICIAL SPECIMEN)

## 2024-03-20 LAB — RENAL FUNCTION PANEL
Albumin: 2.6 g/dL — ABNORMAL LOW (ref 3.5–5.0)
Anion gap: 10 (ref 5–15)
BUN: 16 mg/dL (ref 8–23)
CO2: 26 mmol/L (ref 22–32)
Calcium: 8.6 mg/dL — ABNORMAL LOW (ref 8.9–10.3)
Chloride: 100 mmol/L (ref 98–111)
Creatinine, Ser: 5.18 mg/dL — ABNORMAL HIGH (ref 0.61–1.24)
GFR, Estimated: 12 mL/min — ABNORMAL LOW (ref >60)
Glucose, Bld: 124 mg/dL — ABNORMAL HIGH (ref 70–99)
Phosphorus: 2.9 mg/dL (ref 2.5–4.6)
Potassium: 3.8 mmol/L (ref 3.5–5.1)
Sodium: 136 mmol/L (ref 135–145)

## 2024-03-20 LAB — CBC
HCT: 30.9 % — ABNORMAL LOW (ref 39.0–52.0)
Hemoglobin: 9.8 g/dL — ABNORMAL LOW (ref 13.0–17.0)
MCH: 30.7 pg (ref 26.0–34.0)
MCHC: 31.7 g/dL (ref 30.0–36.0)
MCV: 96.9 fL (ref 80.0–100.0)
Platelets: 143 K/uL — ABNORMAL LOW (ref 150–400)
RBC: 3.19 MIL/uL — ABNORMAL LOW (ref 4.22–5.81)
RDW: 14.5 % (ref 11.5–15.5)
WBC: 8 K/uL (ref 4.0–10.5)
nRBC: 0 % (ref 0.0–0.2)

## 2024-03-20 LAB — SARS CORONAVIRUS 2 BY RT PCR: SARS Coronavirus 2 by RT PCR: NEGATIVE

## 2024-03-20 LAB — HIV ANTIBODY (ROUTINE TESTING W REFLEX): HIV Screen 4th Generation wRfx: NONREACTIVE

## 2024-03-20 LAB — SEDIMENTATION RATE: Sed Rate: 74 mm/h — ABNORMAL HIGH (ref 0–16)

## 2024-03-20 LAB — GLUCOSE, CAPILLARY: Glucose-Capillary: 81 mg/dL (ref 70–99)

## 2024-03-20 LAB — MRSA NEXT GEN BY PCR, NASAL: MRSA by PCR Next Gen: DETECTED — AB

## 2024-03-20 MED ORDER — ALTEPLASE 2 MG IJ SOLR: 2.0000 mg | Freq: Once | INTRAMUSCULAR | Status: DC | PRN

## 2024-03-20 MED ORDER — FENTANYL CITRATE (PF) 100 MCG/2ML IJ SOLN
INTRAMUSCULAR | Status: AC
  Filled 2024-03-20: qty 2

## 2024-03-20 MED ORDER — ACETAMINOPHEN 325 MG PO TABS: 650.0000 mg | ORAL_TABLET | ORAL | Status: DC | PRN

## 2024-03-20 MED ORDER — MIDAZOLAM HCL 2 MG/2ML IJ SOLN
INTRAMUSCULAR | Status: AC
  Filled 2024-03-20: qty 2

## 2024-03-20 MED ORDER — VANCOMYCIN HCL IN DEXTROSE 1-5 GM/200ML-% IV SOLN
1000.0000 mg | INTRAVENOUS | Status: DC
  Administered 2024-03-21 – 2024-03-22 (×2): 1000 mg via INTRAVENOUS
  Filled 2024-03-20: qty 200

## 2024-03-20 MED ORDER — PENTAFLUOROPROP-TETRAFLUOROETH EX AERO: 1.0000 | INHALATION_SPRAY | CUTANEOUS | Status: DC | PRN

## 2024-03-20 MED ORDER — MUPIROCIN 2 % EX OINT
1.0000 | TOPICAL_OINTMENT | Freq: Two times a day (BID) | CUTANEOUS | Status: AC
  Administered 2024-03-21 – 2024-03-25 (×9): 1 via NASAL
  Filled 2024-03-20 (×2): qty 22
  Filled 2024-03-20: qty 44
  Filled 2024-03-20: qty 22

## 2024-03-20 MED ORDER — LIDOCAINE-PRILOCAINE 2.5-2.5 % EX CREA: 1.0000 | TOPICAL_CREAM | CUTANEOUS | Status: DC | PRN

## 2024-03-20 MED ORDER — SODIUM CHLORIDE 0.9% FLUSH
3.0000 mL | Freq: Two times a day (BID) | INTRAVENOUS | Status: DC
  Administered 2024-03-21 – 2024-03-25 (×8): 3 mL via INTRAVENOUS

## 2024-03-20 MED ORDER — SODIUM CHLORIDE 0.9 % IV SOLN: 250.0000 mL | INTRAVENOUS | Status: AC | PRN

## 2024-03-20 MED ORDER — VANCOMYCIN VARIABLE DOSE PER UNSTABLE RENAL FUNCTION (PHARMACIST DOSING): Status: DC

## 2024-03-20 MED ORDER — LIDOCAINE HCL (PF) 1 % IJ SOLN
INTRAMUSCULAR | Status: AC
  Filled 2024-03-20: qty 30

## 2024-03-20 MED ORDER — SODIUM CHLORIDE 0.9% FLUSH: 3.0000 mL | INTRAVENOUS | Status: DC | PRN

## 2024-03-20 MED ORDER — CHLORHEXIDINE GLUCONATE CLOTH 2 % EX PADS
6.0000 | MEDICATED_PAD | Freq: Every day | CUTANEOUS | Status: DC
Start: 2024-03-20 — End: 2024-03-27
  Administered 2024-03-20 – 2024-03-27 (×8): 6 via TOPICAL

## 2024-03-20 MED ORDER — SODIUM CHLORIDE 0.9 % IV SOLN
3.0000 g | Freq: Two times a day (BID) | INTRAVENOUS | Status: DC
  Administered 2024-03-21 – 2024-03-25 (×10): 3 g via INTRAVENOUS
  Filled 2024-03-20 (×11): qty 8

## 2024-03-20 MED ORDER — FENTANYL CITRATE (PF) 100 MCG/2ML IJ SOLN
INTRAMUSCULAR | Status: DC | PRN
  Administered 2024-03-20: 50 ug via INTRAVENOUS

## 2024-03-20 MED ORDER — SODIUM CHLORIDE 0.9 % WEIGHT BASED INFUSION: 1.0000 mL/kg/h | INTRAVENOUS | Status: DC

## 2024-03-20 MED ORDER — VANCOMYCIN HCL 1500 MG/300ML IV SOLN
1500.0000 mg | Freq: Once | INTRAVENOUS | Status: AC
  Administered 2024-03-20: 1500 mg via INTRAVENOUS
  Filled 2024-03-20: qty 300

## 2024-03-20 MED ORDER — MIDAZOLAM HCL 2 MG/2ML IJ SOLN
INTRAMUSCULAR | Status: DC | PRN
  Administered 2024-03-20: 1 mg via INTRAVENOUS

## 2024-03-20 MED ORDER — LIDOCAINE HCL (PF) 1 % IJ SOLN: 5.0000 mL | INTRAMUSCULAR | Status: DC | PRN

## 2024-03-20 MED ORDER — LIDOCAINE HCL (PF) 1 % IJ SOLN
INTRAMUSCULAR | Status: DC | PRN
  Administered 2024-03-20: 10 mL via INTRADERMAL

## 2024-03-20 MED ORDER — HYDRALAZINE HCL 20 MG/ML IJ SOLN: 5.0000 mg | INTRAMUSCULAR | Status: DC | PRN

## 2024-03-20 MED ORDER — LABETALOL HCL 5 MG/ML IV SOLN
10.0000 mg | INTRAVENOUS | Status: DC | PRN
  Administered 2024-03-23: 10 mg via INTRAVENOUS
  Filled 2024-03-20: qty 4

## 2024-03-20 MED ORDER — IODIXANOL 320 MG/ML IV SOLN
INTRAVENOUS | Status: DC | PRN
  Administered 2024-03-20: 45 mL

## 2024-03-20 MED ORDER — HEPARIN SODIUM (PORCINE) 1000 UNIT/ML DIALYSIS: 1000.0000 [IU] | INTRAMUSCULAR | Status: DC | PRN

## 2024-03-20 MED ORDER — HEPARIN (PORCINE) IN NACL 1000-0.9 UT/500ML-% IV SOLN
INTRAVENOUS | Status: DC | PRN
  Administered 2024-03-20 (×2): 500 mL

## 2024-03-20 NOTE — TOC Initial Note (Signed)
 Transition of Care [facility]) - Initial/Assessment Note    Patient Details  Name: [name] [name] MRN: [id number] Date of Birth: 11-16-1960  Transition of Care [facility]) CM/SW Contact:    Jeoffrey LITTIE Moose, [place] Phone Number: 03/20/2024, 10:03 AM  Clinical Narrative:                 Pt admitted from home due to pain in foot. Pt mother is his caregiver. No current TOC needs, please consult as needs arise.        Patient Goals and CMS Choice            Expected Discharge Plan and Services                                              Prior Living Arrangements/Services                       Activities of Daily Living   ADL Screening (condition at time of admission) Independently performs ADLs?: Yes (appropriate for developmental age) Is the patient deaf or have difficulty hearing?: No Does the patient have difficulty seeing, even when wearing glasses/contacts?: No Does the patient have difficulty concentrating, remembering, or making decisions?: No  Permission Sought/Granted                  Emotional Assessment              Admission diagnosis:  Postoperative wound infection [T81.49XA] Wound infection after surgery [T81.49XA] Patient Active Problem List   Diagnosis Date Noted   Postoperative wound infection 03/19/2024   ESRD on dialysis (HCC) 03/19/2024   PVD (peripheral vascular disease) 03/19/2024   Thrombocytopenia 03/19/2024   Controlled type 2 diabetes mellitus without complication, without long-term current use of insulin  (HCC) 03/19/2024   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   Liver lesion 09/05/2023   Chronic idiopathic constipation 09/05/2023   Hemorrhoids 09/05/2023   Dyspepsia 06/01/2023   Dysphagia 06/01/2023   Hyperkalemia 05/19/2023   Infection of AV graft for dialysis 03/27/2023   MRSA bacteremia 03/21/2023   Enterococcal bacteremia 03/21/2023   Hemodialysis catheter infection 03/21/2023    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   Delirium 03/21/2023   Protein-calorie malnutrition, severe 03/16/2023   Acute metabolic encephalopathy 03/06/2023   Rectal bleeding 01/14/2023   Acute blood loss anemia 01/14/2023   Hypokalemia 01/12/2023   Proctitis 01/12/2023   Hypothyroidism 01/12/2023   GERD (gastroesophageal reflux disease) 01/12/2023   ESRD (end stage renal disease) (HCC) 01/12/2023   GI bleed 01/11/2023   Anemia of chronic renal failure 06/09/2022   Pulmonary hypertension, unspecified (HCC) 07/20/2021   Stage 3 chronic kidney disease (HCC) 11/27/2020   Acute on chronic combined systolic and diastolic CHF (congestive heart failure) (HCC) 09/09/2020   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  [address] [place] [address] Phone: [phone] Fax: [phone]  [facility] [place], [place] - [address], [place] [address] [place] [address] Phone: [phone] Fax: [phone]  [facility] [address] [place] [address] Phone: [phone] Fax: [phone]     Social Drivers of Health (SDOH) Social History: SDOH Screenings   Food Insecurity: No Food Insecurity (03/19/2024)  Housing: Low Risk  (03/20/2024)  Transportation Needs: No Transportation Needs (03/19/2024)  Utilities: Not At  Risk (03/19/2024)  Depression (PHQ2-9): Low Risk  (09/18/2018)  Social Connections: Unknown (03/20/2024)  Tobacco Use: Low Risk  (03/19/2024)   SDOH Interventions:     Readmission Risk Interventions    03/29/2023    1:45 PM 03/07/2023    1:31 PM  Readmission Risk Prevention Plan  Transportation Screening Complete Complete  HRI or Home Care Consult  Complete  Palliative Care Screening  Not Applicable  Medication Review (RN Care Manager) Complete Complete  PCP or Specialist appointment within 3-5 days of discharge Complete   HRI or Home Care Consult Complete   SW Recovery Care/Counseling Consult Complete   Palliative Care Screening Complete   Skilled Nursing Facility Complete

## 2024-03-20 NOTE — Progress Notes (Signed)
 Patient brought to 4E from cath lab. Telemetry box applied, CCMD notified. Patient oriented to room and staff. Call bell in reach.   03/20/24 1730  Vitals  Temp (!) 97.5 F (36.4 C)  Temp Source Oral  BP 116/84  MAP (mmHg) 93  BP Location Left Arm  BP Method Automatic  Patient Position (if appropriate) Lying  Pulse Rate 80  Pulse Rate Source Monitor  ECG Heart Rate 80  Resp 20  MEWS COLOR  MEWS Score Color Green  Oxygen Therapy  SpO2 94 %  O2 Device Room Air  MEWS Score  MEWS Temp 0  MEWS Systolic 0  MEWS Pulse 0  MEWS RR 0  MEWS LOC 0  MEWS Score 0

## 2024-03-20 NOTE — Progress Notes (Signed)
 Pt receives out-pt HD at [facility], MWF, 1240 chair time. Will continue to assists as needed.  Lavanda Tirsa Gail Dialysis Navigator [phone]

## 2024-03-20 NOTE — Consult Note (Signed)
 PODIATRY CONSULTATION  NAME [name] [name] MRN [id number] DOB [birth year] DOA 03/19/2024   Reason for consult:  Chief Complaint  Patient presents with   Wound Infection    Attending/Consulting physician: MYRTIS Dawley MD  History of present illness: [name] [name] is a 63 y.o. male with medical history significant of hypertension, hyperlipidemia, diabetes mellitus type 2, ESRD on HD, hypothyroidism who presents with post-operative foot pain. He is accompanied by his mother, who is also his caretaker.   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 Initially, there was no pain post-operatively until Saturday, when he began experiencing severe pain, which his mother describes that he 'screamed and hollered all night long.'  Patient noted that surgical site wound had opened up and he had redness present on the forefoot.  He was was seen at [facility] 2 days ago where he had a MRI of his left foot which noted full-thickness wound at the tip of the second metatarsal head with either exposure or near exposure of the second metatarsal head.  He was prescribed doxycycline  due to concern for an infection of his wound and oxycodone  for pain.  He notes that the oxycodone  did help some with his pain.   Pt saw Dr. Gershon yesterday and due to full thickness dehiscence with exposed bone, decision was made to send him in to ED for vascular consult and further amputation of the left foot. I discussed findings and plan for the patient. He is agreeable to proceed with the transmetatarsal amputation at my recommendation.   Past Medical History:  Diagnosis Date   Anemia    Arthritis    Cardiomyopathy [facility])    July 2022   Diabetes mellitus [facility])    Dialysis patient    GERD (gastroesophageal reflux disease)    HTN (hypertension)    Hyperlipidemia    Hypothyroidism    Nephrotic syndrome    HD MWF   Obesity    Pulmonary hypertension (HCC)    July 2022 in  setting of nephrotic syndrome   Secondary hyperparathyroidism of renal origin        Latest Ref Rng & Units 03/20/2024   12:22 AM 03/19/2024    1:09 PM 03/17/2024    5:01 PM  CBC  WBC 4.0 - 10.5 K/uL 8.0  9.5  11.5   Hemoglobin 13.0 - 17.0 g/dL 9.8  89.7  88.8   Hematocrit 39.0 - 52.0 % 30.9  33.1  34.8   Platelets 150 - 400 K/uL 143  148  145        Latest Ref Rng & Units 03/20/2024   12:22 AM 03/19/2024    1:09 PM 03/17/2024    5:01 PM  BMP  Glucose 70 - 99 mg/dL 875  882  860   BUN 8 - 23 mg/dL 16  12  18    Creatinine 0.61 - 1.24 mg/dL 4.81  5.56  4.05   Sodium 135 - 145 mmol/L 136  137  132   Potassium 3.5 - 5.1 mmol/L 3.8  3.6  4.3   Chloride 98 - 111 mmol/L 100  97  95   CO2 22 - 32 mmol/L 26  25  23    Calcium  8.9 - 10.3 mg/dL 8.6  8.9  89.8       Physical Exam: Lower Extremity Exam  DP weakly palpable left foot, pt non palpable  Complete full thickness dehiscence of 2nd and 3rd toe amputation  on left foot with exposed 2nd and 3rd met head with surrunding erythema, necrosis noted of the distal met heads that are seen.  Mild seropurulent drainage is seen today.   Sensation diminished to left foot      ASSESSMENT/PLAN OF CARE 63 y.o. male with PMHx significant for  hypertension, hyperlipidemia, diabetes mellitus type 2, ESRD on HD, hypothyroidism   with left foot 2nd and 3rd toe amp site dehiscence and early acute osteomyelitis and necrosis of 2nd and 3rd met head, also with suspected PVD  WBC 8 CRP 15.7, ESR 74 MRI R foot: Full thickness ulceration with exposed bone 2nd met head  - Given clinical findings patient will require left foot Transmetatarsal amputation, feel this is best to try and get to an area with healing potential and closed tissue envelope.  - NPO p MN tomorrow night for OR Friday AM for L foot TMA. He is in agreement to proceed.  - Appreciate vascular surgery, plan for angiogram LLE today to optimize for healing.  - Continue IV abx broad  spectrum pending further culture data - Anticoagulation: Ok to continue per vascular recs - Wound care: betadine gauze dressing pre op prn - WB status: NWB in post op shoe - Will continue to follow   Thank you for the consult.  Please contact me directly with any questions or concerns.           Marolyn JULIANNA Honour, DPM [facility]    [address] [place], [place] [address]                Office [phone]  Fax [phone]

## 2024-03-20 NOTE — Progress Notes (Signed)
 Clarified to Lynwood Kipper NP about the medications if to be given since the patient is on NPO. Also clarified about the heparin  [place] and replied that the day shift team will decide if the medication is to be given.

## 2024-03-20 NOTE — Plan of Care (Signed)
   Problem: Education: Goal: Knowledge of General Education information will improve Description Including pain rating scale, medication(s)/side effects and non-pharmacologic comfort measures Outcome: Progressing

## 2024-03-20 NOTE — Op Note (Signed)
    Patient name: [name] [name] MRN: [id number] DOB: 18-Apr-1961 Sex: male  03/20/2024 Pre-operative Diagnosis: Left lower extremity critical limb ischemia with tissue loss, nonhealing toe amputation site Post-operative diagnosis:  Same Surgeon:  Fonda FORBES Rim, MD Procedure Performed: 1.  Ultrasound-guided micropuncture access of the right common femoral artery in retrograde fashion 2.  Aortogram 3.  Second-order cannulation, left lower extremity angiogram 4.  Third cannulation, left lower extremity angiogram 5.  Moderate sedation time 12 minutes, contrast volume 45 mL   Indications: Patient is a 63 year old male with known left lower extremity critical and ischemia with tissue loss.  He underwent tibial intervention a month ago, but has had poor wound healing at the toe amputation site.  He presents today for repeat angiography in an effort to improve distal perfusion for wound healing.  Findings: Sluggish flow indicative of heart failure  Aortogram: Widely patent infrarenal aorta with no flow-limiting stenosis in the aortoiliac segments bilaterally On the left: Widely patent common femoral artery, profunda, superficial femoral artery, popliteal artery. Severe calcific disease of the tibial vessels with two-vessel runoff, anterior tibial artery, posterior tibial artery.  Severe small vessel disease in the foot.   Procedure:  The patient was identified in the holding area and taken to room 8.  The patient was then placed supine on the table and prepped and draped in the usual sterile fashion.  A time out was called.  Ultrasound was used to evaluate the right common femoral artery.  It was patent .  A digital ultrasound image was acquired.  A micropuncture needle was used to access the right common femoral artery under ultrasound guidance.  An 018 wire was advanced without resistance and a micropuncture sheath was placed.  The 018 wire was removed and a benson wire was placed.  The  micropuncture sheath was exchanged for a 5 french sheath.  An omniflush catheter was advanced over the wire to the level of L-1.  An abdominal angiogram was obtained.  Next, using the omniflush catheter and a benson wire, the aortic bifurcation was crossed and the catheter was placed into theleft external iliac artery and left runoff was obtained.  I then moved the catheter into the superficial femoral artery for lower extremity angiography due to issues with contrast resolution due to severe heart failure.    Impression: Severe heart failure, severe small vessel disease.  Prior occluded anterior tibial artery is patent.  Patient has been maximally revascularized.  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   Fonda FORBES Rim MD [facility] Office: [phone]

## 2024-03-20 NOTE — Progress Notes (Addendum)
 Pharmacy Antibiotic Note  [name] [name] is a 63 y.o. male admitted on 03/19/2024 with non-healing wound infection 2nd/3rd toe.  Pharmacy has been consulted for Unasyn and Vancomycin  dosing. Currently on Vanc 1g every MWF on HD.   Vanc random drawn 0328 and resulted at 35. Patient received 1g on 10/9 and 1g on 10/10 after dialysis.   Plan: Hold Vanc dose on Monday Recheck pre-dialysis vanc level on Wednesday Unasyn 3g q12h.  Follow culture data for de-escalation.  Monitor renal function for dose adjustments as indicated.     Temp (24hrs), Avg:98.1 F (36.7 C), Min:97.9 F (36.6 C), Max:98.4 F (36.9 C)  Recent Labs  Lab 03/17/24 1701 03/19/24 1309 03/19/24 1327 03/20/24 0022  WBC 11.5* 9.5  --  8.0  CREATININE 5.94* 4.43*  --  5.18*  LATICACIDVEN  --   --  1.4  --     Estimated Creatinine Clearance: 14.7 mL/min (A) (by C-G formula based on SCr of 5.18 mg/dL (H)).    No Known Allergies  Antimicrobials this admission: Unasyn 10/7 >>  Vancomycin  10/8 >>   Microbiology results: 10/7 BCx:   Thank you for allowing pharmacy to be a part of this patient's care.  Dionicia Canavan, PharmD, RPh PGY1 Acute Care Pharmacy Resident [facility]  03/23/2024 1:23 PM

## 2024-03-20 NOTE — Consult Note (Signed)
 [facility] Renal Consultation Note    Indication for Consultation:  Management of ESRD/hemodialysis; anemia, hypertension/volume and secondary hyperparathyroidism  ERE:Fnmmnt, Beverley, MD  HPI: [name] [name] is a 63 y.o. male with ESRD on HD MWF at [facility]. He has a past medical history significant for arthritis, T2DM, HTN, HLD, and pulmonary hypertension.  Patient underwent a left 2nd/3rd toes amputation on 02/19/2024.  He then presented to his podiatry office earlier this week for worsening amputation site and left foot pain.  Patient was then advised by podiatry to come to the ED for further evaluation for concern of nonhealing amputation site.  VVS has been consulted and pharmacy is now following.  Currently on IV ABXs.  Seen and examined patient at bedside.  He remains on RA and blood pressures are stable.  Euvolemic on exam.  He denies SOB, CP, and N/V. Noted repeat angiogram scheduled for today. He may need a left TMA in the future.  Will also plan for dialysis today per his routine schedule.  Past Medical History:  Diagnosis Date   Anemia    Arthritis    Cardiomyopathy [facility])    July 2022   Diabetes mellitus [facility])    Dialysis patient    GERD (gastroesophageal reflux disease)    HTN (hypertension)    Hyperlipidemia    Hypothyroidism    Nephrotic syndrome    HD MWF   Obesity    Pulmonary hypertension (HCC)    July 2022 in setting of nephrotic syndrome   Secondary hyperparathyroidism of renal origin    Past Surgical History:  Procedure Laterality Date   ABDOMINAL AORTOGRAM W/LOWER EXTREMITY N/A 01/30/2024   Procedure: ABDOMINAL AORTOGRAM W/LOWER EXTREMITY;  Surgeon: Serene Gaile ORN, MD;  [facility]: [facility] INVASIVE CV LAB;  Service: Cardiovascular;  Laterality: N/A;   AMPUTATION TOE Left 02/14/2024   Procedure: AMPUTATION, TOE LEFT SECOND AND THIRD TOES;  Surgeon: Gershon Donnice SAUNDERS, DPM;  Location: [facility] OR;  Service: Orthopedics/Podiatry;  Laterality: Left;    AV FISTULA PLACEMENT Left 12/27/2022   Procedure: INSERTION OF LEFT ARM ARTERIOVENOUS (AV) GORE-TEX GRAFT;  Surgeon: Eliza Lonni RAMAN, MD;  Location: [facility] OR;  Service: Vascular;  Laterality: Left;   AV FISTULA PLACEMENT Right 08/17/2023   Procedure: INSERTION OF ARTERIOVENOUS (AV) GORE-TEX GRAFT RIGHT ARM USING 4-47mm GORETEX STRETCH GRAFT;  Surgeon: Magda Debby SAILOR, MD;  Location: [facility] OR;  Service: Vascular;  Laterality: Right;   AVGG REMOVAL Left 03/15/2023   Procedure: REMOVAL OF LEFT UPPER ARM ARTERIOVENOUS GORETEX GRAFT (AVGG);  Surgeon: Magda Debby SAILOR, MD;  Location: [facility] OR;  Service: Vascular;  Laterality: Left;   BIOPSY  01/13/2023   Procedure: BIOPSY;  Surgeon: Cinderella Deatrice FALCON, MD;  Location: [facility] ENDO SUITE;  Service: Endoscopy;;   CATARACT EXTRACTION, BILATERAL  2018   COLONOSCOPY WITH PROPOFOL  N/A 01/13/2023   Procedure: COLONOSCOPY WITH PROPOFOL ;  Surgeon: Cinderella Deatrice FALCON, MD;  Location: [facility] ENDO SUITE;  Service: Endoscopy;  Laterality: N/A;   DIALYSIS/PERMA CATHETER INSERTION Right 01/10/2024   Procedure: DIALYSIS/PERMA CATHETER INSERTION;  Surgeon: Gretta Lonni PARAS, MD;  Location: [facility] PV LAB;  Service: Cardiovascular;  Laterality: Right;   INSERTION OF DIALYSIS CATHETER Right 12/27/2022   Procedure: INSERTION OF Right Internal Jugular  DIALYSIS CATHETER;  Surgeon: Eliza Lonni RAMAN, MD;  Location: [facility] OR;  Service: Vascular;  Laterality: Right;   INSERTION OF DIALYSIS CATHETER N/A 03/15/2023   Procedure: INSERTION OF TUNNELED DIALYSIS CATHETER;  Surgeon: Magda Debby SAILOR, MD;  Location:  [facility] OR;  Service: Vascular;  Laterality: N/A;   INSERTION OF DIALYSIS CATHETER Right 03/25/2023   Procedure: INSERTION OF RIGHT TUNNELED DIALYSIS CATHETER;  Surgeon: Pearline Norman RAMAN, MD;  Location: [facility] OR;  Service: Vascular;  Laterality: Right;   LOWER EXTREMITY INTERVENTION  01/30/2024   Procedure: LOWER EXTREMITY INTERVENTION;  Surgeon: Serene Gaile ORN, MD;  [facility] INVAS[facility]  CV LAB;  Service: Cardiovascular;;   PERIPHERAL INTRAVASCULAR LITHOTRIPSY  01/30/2024   Procedure: PERIPHERAL INTRAVASCULAR LITHOTRIPSY;  Surgeon: Serene Gaile ORN, MD;  [facility];  Service: Cardiovascular;;   PERIPHERAL VASCULAR ATHERECTOMY  01/30/2024   Procedure: PERIPHERAL VASCULAR ATHERECTOMY;  Surgeon: Serene Gaile ORN, MD;  [facility];  Service: Cardiovascular;;   PERIPHERAL VASCULAR THROMBECTOMY Right 12/20/2023   Procedure: PERIPHERAL VASCULAR THROMBECTOMY;  Surgeon: Pearline Norman RAMAN, MD;  Location: [facility];  Service: Cardiovascular;  Laterality: Right;   TEE WITHOUT CARDIOVERSION N/A 03/20/2023   Procedure: TRANSESOPHAGEAL ECHOCARDIOGRAM;  Surgeon: Barbaraann Darryle Ned, MD;  Location: [facility] OR;  Service: Cardiovascular;  Laterality: N/A;   TONSILLECTOMY     VENOUS ANGIOPLASTY  12/20/2023   Procedure: VENOUS ANGIOPLASTY;  Surgeon: Pearline Norman RAMAN, MD;  Location: [facility];  Service: Cardiovascular;;   VENOUS STENT  12/20/2023   Procedure: VENOUS STENT;  Surgeon: Pearline Norman RAMAN, MD;  Location: [facility] LAB;  Service: Cardiovascular;;  outflow   Family History  Problem Relation Age of Onset   Hypertension Mother    Diabetes Mother    Cancer Father        prostate cancer   Hypertension Maternal Grandfather    Rheum arthritis Maternal Grandmother    CAD Neg Hx    Social History:  reports that he has never smoked. He has never used smokeless tobacco. He reports that he does not drink alcohol and does not use drugs. No Known Allergies Prior to Admission medications   Medication Sig Start Date End Date Taking? Authorizing Provider  ACETAMINOPHEN  PO Take 650 mg by mouth every 8 (eight) hours as needed for moderate pain (pain score 4-6).   Yes [provider]  amitriptyline  (ELAVIL ) 10 MG tablet Take 10 mg by mouth at bedtime.   Yes [provider]  amLODipine  (NORVASC ) 10 MG tablet Take 1 tablet (10 mg total) by mouth every morning.  03/29/23  Yes Regalado, Belkys A, MD  aspirin  (ASPIRIN  CHILDRENS) 81 MG chewable tablet Chew 1 tablet (81 mg total) by mouth daily. 01/30/24 01/29/25 Yes Serene Gaile ORN, MD  atorvastatin  (LIPITOR) 20 MG tablet Take 20 mg by mouth in the morning.   Yes [provider]  benzonatate  (TESSALON ) 200 MG capsule Take 200 mg by mouth 3 (three) times daily as needed for cough.   Yes [provider]  calcium  acetate (PHOSLO) 667 MG capsule Take 1,334 mg by mouth 3 (three) times daily with meals. 07/07/23 06/27/24 Yes [provider]  clopidogrel  (PLAVIX ) 75 MG tablet Take 1 tablet (75 mg total) by mouth daily. 01/30/24  Yes Serene Gaile ORN, MD  Cyanocobalamin  (VITAMIN B12 PO) Take 1 tablet by mouth at bedtime.   Yes [provider]  DM-APAP-CPM (CORICIDIN HBP PO) Take 2 tablets by mouth daily as needed (congestion).   Yes [provider]  doxycycline  (VIBRAMYCIN ) 100 MG capsule Take 1 capsule (100 mg total) by mouth 2 (two) times daily for 10 days. 03/17/24 03/27/24 Yes Elnor Hila P, DO  gabapentin  (NEURONTIN ) 100 MG capsule Take 100 mg  by mouth at bedtime. 07/05/23  Yes [provider]  levothyroxine  (SYNTHROID ) 50 MCG tablet Take 50 mcg by mouth daily before breakfast.   Yes [provider]  metoprolol  tartrate (LOPRESSOR ) 50 MG tablet Take 1 tablet (50 mg total) by mouth 2 (two) times daily. 06/16/20  Yes Comer Kirsch, PA-C  mupirocin  ointment (BACTROBAN ) 2 % Apply 1 Application topically 2 (two) times daily. 11/23/23  Yes Gershon Donnice SAUNDERS, DPM  oxyCODONE -acetaminophen  (PERCOCET/ROXICET) 5-325 MG tablet Take 1 tablet by mouth every 6 (six) hours as needed for severe pain (pain score 7-10). 03/17/24  Yes Elnor Hila P, DO  pantoprazole  (PROTONIX ) 40 MG tablet Take 1 tablet (40 mg total) by mouth daily. 30 minutes before breakfast 06/01/23  Yes Shirlean Therisa ORN, NP  Probiotic Product (PROBIOTIC DAILY PO) Take 1 capsule by mouth at bedtime.   Yes  [provider]  senna-docusate (SENOKOT-S) 8.6-50 MG tablet Take 1 tablet by mouth daily as needed for mild constipation.   Yes [provider]  Accu-Chek Softclix Lancets lancets  08/06/20   [provider]  AZITHROMYCIN  PO Take by mouth 2 (two) times daily with a meal.    [provider]  hydrALAZINE  (APRESOLINE ) 10 MG tablet Take 1 tablet (10 mg total) by mouth every 8 (eight) hours. Patient not taking: Reported on 03/19/2024 03/29/23   Regalado, Owen A, MD  HYDROcodone -acetaminophen  (NORCO/VICODIN) 5-325 MG tablet Take 1 tablet by mouth every 8 (eight) hours as needed. Patient not taking: Reported on 03/19/2024 02/14/24   Gershon Donnice SAUNDERS, DPM   Current Facility-Administered Medications  Medication Dose Route Frequency Provider Last Rate Last Admin   acetaminophen  (TYLENOL ) tablet 650 mg  650 mg Oral Q6H PRN Smith, Rondell A, MD       Or   acetaminophen  (TYLENOL ) suppository 650 mg  650 mg Rectal Q6H PRN Claudene Reeves A, MD       albuterol  (PROVENTIL ) (2.5 MG/3ML) 0.083% nebulizer solution 2.5 mg  2.5 mg Nebulization Q6H PRN Smith, Rondell A, MD       amitriptyline  (ELAVIL ) tablet 10 mg  10 mg Oral QHS Smith, Rondell A, MD   10 mg at 03/19/24 2237   amLODipine  (NORVASC ) tablet 10 mg  10 mg Oral q morning Smith, Rondell A, MD       Ampicillin-Sulbactam (UNASYN) 3 g in sodium chloride  0.9 % 100 mL IVPB  3 g Intravenous Q12H Tanda Powell ORN, RPH       atorvastatin  (LIPITOR) tablet 20 mg  20 mg Oral q AM Smith, Rondell A, MD       benzonatate  (TESSALON ) capsule 200 mg  200 mg Oral TID PRN Smith, Rondell A, MD       calcium  acetate (PHOSLO) capsule 1,334 mg  1,334 mg Oral TID WC Smith, Rondell A, MD       Chlorhexidine  Gluconate Cloth 2 % PADS 6 each  6 each Topical Q0600 Lenon Charmaine BRAVO, NP       gabapentin  (NEURONTIN ) capsule 100 mg  100 mg Oral QHS Smith, Rondell A, MD   100 mg at 03/19/24 2237   guaiFENesin  (MUCINEX ) 12 hr tablet 600 mg  600 mg  Oral BID Smith, Rondell A, MD   600 mg at 03/19/24 2237   heparin  injection 5,000 Units  5,000 Units Subcutaneous Q8H Smith, Rondell A, MD   5,000 Units at 03/19/24 2237   hydrALAZINE  (APRESOLINE ) tablet 10 mg  10 mg Oral Q8H Claudene Reeves LABOR, MD  levothyroxine  (SYNTHROID ) tablet 50 mcg  50 mcg Oral Q0600 Claudene Maximino LABOR, MD       metoprolol  tartrate (LOPRESSOR ) tablet 50 mg  50 mg Oral BID Smith, Rondell A, MD   50 mg at 03/19/24 2237   ondansetron  (ZOFRAN ) tablet 4 mg  4 mg Oral Q6H PRN Claudene Maximino LABOR, MD       Or   ondansetron  (ZOFRAN ) injection 4 mg  4 mg Intravenous Q6H PRN Claudene Maximino LABOR, MD       oxyCODONE -acetaminophen  (PERCOCET/ROXICET) 5-325 MG per tablet 1 tablet  1 tablet Oral Q6H PRN Claudene Maximino LABOR, MD       pantoprazole  (PROTONIX ) EC tablet 40 mg  40 mg Oral Daily Smith, Rondell A, MD       senna-docusate (Senokot-S) tablet 1 tablet  1 tablet Oral Daily PRN Claudene Maximino A, MD       sodium chloride  flush (NS) 0.9 % injection 3 mL  3 mL Intravenous Q12H Claudene, Rondell A, MD   3 mL at 03/19/24 2238   vancomycin  (VANCOREADY) IVPB 1500 mg/300 mL  1,500 mg Intravenous Once Tanda Powell ORN, [facility]       vancomycin  variable dose per unstable renal function (pharmacist dosing)   Does not apply See admin instructions Tanda Powell ORN [facility]       Labs: Basic Metabolic Panel: Recent Labs  Lab 03/17/24 1701 03/19/24 1309 03/20/24 0022  NA 132* 137 136  K 4.3 3.6 3.8  CL 95* 97* 100  CO2 23 25 26   GLUCOSE 139* 117* 124*  BUN 18 12 16   CREATININE 5.94* 4.43* 5.18*  CALCIUM  10.1 8.9 8.6*  PHOS  --   --  2.9   Liver Function Tests: Recent Labs  Lab 03/19/24 1309 03/20/24 0022  AST 14*  --   ALT 7  --   ALKPHOS 83  --   BILITOT 0.7  --   PROT 6.7  --   ALBUMIN  2.9* 2.6*   No results for input(s): LIPASE, AMYLASE in the last 168 hours. No results for input(s): AMMONIA in the last 168 hours. CBC: Recent Labs  Lab 03/17/24 1701 03/19/24 1309  03/20/24 0022  WBC 11.5* 9.5 8.0  NEUTROABS 9.4* 7.3  --   HGB 11.1* 10.2* 9.8*  HCT 34.8* 33.1* 30.9*  MCV 97.8 99.7 96.9  PLT 145* 148* 143*   Cardiac Enzymes: No results for input(s): CKTOTAL, CKMB, CKMBINDEX, TROPONINI in the last 168 hours. CBG: No results for input(s): GLUCAP in the last 168 hours. Iron Studies: No results for input(s): IRON, TIBC, TRANSFERRIN, FERRITIN in the last 72 hours. Studies/Results: VAS US  ABI WITH/WO TBI Result Date: 03/19/2024  LOWER EXTREMITY DOPPLER STUDY Patient Name:  [name] [name]  Date of Exam:   03/19/2024 Medical Rec #: [id number]        Accession #:    [id number] Date of Birth: 1960/10/04        Patient Gender: M Patient Age:   1 years Exam Location:  [facility] Procedure:      VAS US  ABI WITH/WO TBI Referring Phys: [facility] --------------------------------------------------------------------------------  Indications: non-healing amputation site (2nd and 3rd toes of LLE) High Risk Factors: Hypertension, hyperlipidemia, Diabetes, no history of                    smoking. Other Factors: Left 2nd and 3rd toe amputation on 02/19/2024, ESRD (HD).  Vascular Interventions: Left anterior tibial artery laser atherectomy, shockwave  lithotripsy, and balloon angioplasty on 01/30/2024. Performing Technologist: Leigh Rom RVT, RDMS  Examination Guidelines: A complete evaluation includes at minimum, Doppler waveform signals and systolic blood pressure reading at the level of bilateral brachial, anterior tibial, and posterior tibial arteries, when vessel segments are accessible. Bilateral testing is considered an integral part of a complete examination. Photoelectric Plethysmograph (PPG) waveforms and toe systolic pressure readings are included as required and additional duplex testing as needed. Limited examinations for reoccurring indications may be performed as noted.  ABI Findings:  +---------+------------------+-----+-------------------+----------------+ Right    Rt Pressure (mmHg)IndexWaveform           Comment          +---------+------------------+-----+-------------------+----------------+ PTA                             dampened monophasicnon compressible +---------+------------------+-----+-------------------+----------------+ DP                              monophasic         non compressible +---------+------------------+-----+-------------------+----------------+ Great Toe0                 0.00 Absent                              +---------+------------------+-----+-------------------+----------------+ +---------+------------------+-----+----------+----------------+ Left     Lt Pressure (mmHg)IndexWaveform  Comment          +---------+------------------+-----+----------+----------------+ Brachial 165                    biphasic                   +---------+------------------+-----+----------+----------------+ PTA                             monophasicnon compressible +---------+------------------+-----+----------+----------------+ DP                              monophasicnon compressible +---------+------------------+-----+----------+----------------+ Great Toe0                 0.00 Absent                     +---------+------------------+-----+----------+----------------+ +-------+-----------+-----------+------------+------------+ ABI/TBIToday's ABIToday's TBIPrevious ABIPrevious TBI +-------+-----------+-----------+------------+------------+ Right  [place]         absent     [place]          absent       +-------+-----------+-----------+------------+------------+ Left   [place]         absent               absent       +-------+-----------+-----------+------------+------------+  Bilateral ABIs and TBIs appear essentially unchanged.  Summary: Right: Resting right ankle-brachial index indicates noncompressible right lower  extremity arteries. The right toe-brachial index is absent.  Left: Resting left ankle-brachial index indicates noncompressible left lower extremity arteries. The left toe-brachial index is absent.  *See table(s) above for measurements and observations.  Electronically signed by Penne Colorado MD on 03/19/2024 at 6:35:59 PM.    Final    VAS US  LOWER EXTREMITY ARTERIAL DUPLEX Result Date: 03/19/2024 LOWER EXTREMITY ARTERIAL DUPLEX STUDY Patient Name:  [name] [name]  Date of Exam:   03/19/2024 Medical Rec #: [id number]        Accession #:  [id number] Date of Birth: 10-12-1960        Patient Gender: M Patient Age:   19 years Exam Location:  [facility] Procedure:      VAS US  LOWER EXTREMITY ARTERIAL DUPLEX Referring Phys: [facility] --------------------------------------------------------------------------------  Indications: Non healing amputation site (LLE). High Risk Factors: Hypertension, hyperlipidemia, Diabetes, no history of                    smoking. Other Factors: Left 2nd and 3rd toe amputation on 02/19/2024, ESRD (HD).  Vascular Interventions: Left anterior tibial artery laser atherectomy, shockwave                         lithotripsy, and balloon angioplasty on 01/30/2024. Current ABI:            non compressible ABI absent TBI, bilaterally Comparison Study: No previous Performing Technologist: Jody Hill RVT, RDMS  Examination Guidelines: A complete evaluation includes B-mode imaging, spectral Doppler, color Doppler, and power Doppler as needed of all accessible portions of each vessel. Bilateral testing is considered an integral part of a complete examination. Limited examinations for reoccurring indications may be performed as noted.   +----------+--------+-----+--------+----------+--------+ LEFT      PSV cm/sRatioStenosisWaveform  Comments +----------+--------+-----+--------+----------+--------+ CFA Prox  61                   biphasic            +----------+--------+-----+--------+----------+--------+ CFA Distal52                   triphasic          +----------+--------+-----+--------+----------+--------+ DFA       44                   biphasic           +----------+--------+-----+--------+----------+--------+ SFA Prox  57                   biphasic           +----------+--------+-----+--------+----------+--------+ SFA Mid   48                   biphasic           +----------+--------+-----+--------+----------+--------+ SFA Distal42                   biphasic           +----------+--------+-----+--------+----------+--------+ POP Prox  33                   biphasic           +----------+--------+-----+--------+----------+--------+ POP Distal35                   biphasic           +----------+--------+-----+--------+----------+--------+ TP Trunk  56                   biphasic           +----------+--------+-----+--------+----------+--------+ ATA Prox  49                   biphasic           +----------+--------+-----+--------+----------+--------+ ATA Mid   101                  biphasic           +----------+--------+-----+--------+----------+--------+ ATA Distal27  biphasic           +----------+--------+-----+--------+----------+--------+ PTA Prox  22                   biphasic           +----------+--------+-----+--------+----------+--------+ PTA Mid   44                   biphasic           +----------+--------+-----+--------+----------+--------+ PTA Distal27                   monophasic         +----------+--------+-----+--------+----------+--------+ PERO Prox 18                   biphasic           +----------+--------+-----+--------+----------+--------+ PERO Mid  36                   biphasic           +----------+--------+-----+--------+----------+--------+ DP        32                   monophasic          +----------+--------+-----+--------+----------+--------+  Summary: Left: Arterial wall calcifications throughout lower extremity. No evidence of significant stenosis seen on this exam.  See table(s) above for measurements and observations. Electronically signed by Penne Colorado MD on 03/19/2024 at 6:35:20 PM.    Final     ROS: All others negative except those listed in HPI.   Physical Exam: Vitals:   03/20/24 0551 03/20/24 0827 03/20/24 0828 03/20/24 1034  BP: 114/70   119/76  Pulse: 60   63  Resp: 18   16  Temp: 98.2 F (36.8 C)   98.5 F (36.9 C)  TempSrc: Oral   Oral  SpO2: 95%   95%  Weight:   82 kg   Height:  5' 10 (1.778 m)       General: Awake, alert, NAD, on RA Head: Sclera not icteric  Lungs: CTA bilaterally. No wheeze, rales or rhonchi. Breathing is unlabored. Heart: RRR. No murmur, rubs or gallops.  Abdomen: soft and non-tender Lower extremities: L amputation site: erythema noted, no drainage; No b/l LE edema Neuro: AAOx3. Moves all extremities spontaneously. Dialysis Access: [facility]  Dialysis Orders:  MWF - [facility] 4hrs, BFR 400, DFR Auto 1.5,  EDW 69kg, 3K/ 2.5Ca Heparin  3000 unit bolus and 500 unit mid-run Mircera 150 mcg q2wks - last 9/29  Last Labs: Hgb 9.8, K 3.8, Ca 8.6, P 2.9, Alb 2.6  Assessment/Plan: Non-healing L foot amputation site - On IV ABXs. VVS following. Plan for repeat angiogram today. May need L TMA in future per VVS note. ESRD - on HD MWF. Plan for HD today per his routine schedule. Will work around today's procedure Hypertension/volume  - Euvolemic on exam. BP stable. Anemia of CKD - Last ESA dose 9/29, not due yet Secondary Hyperparathyroidism -  Corr Ca ok and phos low. Hold binders for now. Nutrition - Renal diet with fluid restriction  Charmaine Piety, NP [facility] 03/20/2024, 10:38 AM

## 2024-03-20 NOTE — Progress Notes (Signed)
 PROGRESS NOTE    [name] [name]  FMW:[id number] DOB: [birth year] DOA: 03/19/2024 PCP: Kip Righter, MD   Brief Narrative: This 63 y.o. male with medical history significant of hypertension, hyperlipidemia, diabetes mellitus type 2, ESRD on HD, hypothyroidism who presents with post-operative foot pain. 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 Patient noted that surgical site wound had opened up and he had redness present on the forefoot. He followed up with Dr. Alona today and due to to his symptoms and was advised to come to the hospital for further evaluation and need of IV antibiotics.  Patient was admitted for postoperative wound infection.  Podiatry is consulted,  vascular surgery is consulted.  Patient is scheduled for angiogram today followed by hemodialysis.  Assessment & Plan:   Principal Problem:   Postoperative wound infection Active Problems:   ESRD on dialysis (HCC)   PVD (peripheral vascular disease)   Anemia of chronic disease   Thrombocytopenia   Essential hypertension   Controlled type 2 diabetes mellitus without complication, without long-term current use of insulin  (HCC)   Hypothyroidism   Hyperlipidemia associated with type 2 diabetes mellitus (HCC)   GERD (gastroesophageal reflux disease)  Postoperative wound infection: Patient presented with increasing pain and erythema of his postoperative site. He underwent recent 2nd and 3rd left toe amputation due to gangrene on 02/14/2024 by Dr. Alona.   MRI noted full-thickness wound at the tip of the second metatarsal head. Patient had been started on doxycycline . - Follow-up blood cultures - ESR and CRP elevated. - Hold doxycycline  - Continue empiric antibiotics vancomycin  and ampicillin - Oxycodone  as needed for pain. - Podiatry consulted, will follow-up for any further recommendations   ESRD on HD: Patient follows Monday, Wednesday, Friday schedule.  Last hemodialysis session  on 10/6.   Labs noted potassium 3.6, CO2 25, BUN 12, and creatinine 4.43. - Nephrology consulted for need of dialysis in a.m.   Peripheral vascular disease: - Held aspirin  and Plavix  in case of need of procedure. - Patient is scheduled for angiogram of left lower extremity.  Anemia of chronic disease: Chronic.  Hemoglobin noted to be 10.2 which appears similar to prior. - Continue to monitor.   Thrombocytopenia: Platelet count 148 which appears similar to most recent checks. - Continue to monitor   Essential hypertension: Blood pressure elevated at 155/75 - Continue metoprolol  and amlodipine .   Controlled diabetes mellitus type 2 without long-term use of insulin  Last hemoglobin A1c noted to be 5.1.  Patient is not on any diabetic medications at baseline. - Continue renal and carb modified diet - Continue gabapentin .   Hypothyroidism: - Continue levothyroxine    Hyperlipidemia: - Continue atorvastatin     GERD: - Continue PPI   DVT prophylaxis: Heparin  sq Code Status: Full code Family Communication: No family at bed side Disposition Plan:    Status is: Inpatient Remains inpatient appropriate because: Severity of illness.    Consultants:  Vascular surgery Podiatry Nephrology  Procedures: Scheduled angiography followed by hemodialysis Antimicrobials:  Anti-infectives (From admission, onward)    Start     Dose/Rate Route Frequency Ordered Stop   03/20/24 1800  vancomycin  (VANCOCIN ) IVPB 1000 mg/200 mL premix        1,000 mg 200 mL/hr over 60 Minutes Intravenous Every M-W-F (Hemodialysis) 03/20/24 1114     03/20/24 1700  vancomycin  (VANCOCIN ) IVPB 1000 mg/200 mL premix  Status:  Discontinued        1,000 mg 200 mL/hr  over 60 Minutes Intravenous Every M-W-F (Hemodialysis) 03/19/24 1642 03/20/24 0821   03/20/24 1700  Ampicillin-Sulbactam (UNASYN) 3 g in sodium chloride  0.9 % 100 mL IVPB  Status:  Discontinued        3 g 200 mL/hr over 30 Minutes Intravenous Every  24 hours 03/19/24 1642 03/20/24 0824   03/20/24 1700  Ampicillin-Sulbactam (UNASYN) 3 g in sodium chloride  0.9 % 100 mL IVPB        3 g 200 mL/hr over 30 Minutes Intravenous Every 12 hours 03/20/24 0824     03/20/24 0915  vancomycin  (VANCOREADY) IVPB 1500 mg/300 mL        1,500 mg 150 mL/hr over 120 Minutes Intravenous  Once 03/20/24 0821     03/20/24 0821  vancomycin  variable dose per unstable renal function (pharmacist dosing)  Status:  Discontinued         Does not apply See admin instructions 03/20/24 0821 03/20/24 1114   03/19/24 1530  Ampicillin-Sulbactam (UNASYN) 3 g in sodium chloride  0.9 % 100 mL IVPB       Placed in And Linked Group   3 g 200 mL/hr over 30 Minutes Intravenous  Once 03/19/24 1516 03/20/24 0817   03/19/24 1530  vancomycin  (VANCOCIN ) IVPB 1000 mg/200 mL premix  Status:  Discontinued       Placed in And Linked Group   1,000 mg 200 mL/hr over 60 Minutes Intravenous  Once 03/19/24 1516 03/20/24 9178       Subjective: Patient was seen and examined at bedside.  Overnight events noted. Patient was asking for pain medications.  He feels very hungry from yesterday. He is going to have left lower extremity angiogram followed by hemodialysis today.   Objective: Vitals:   03/20/24 0551 03/20/24 0827 03/20/24 0828 03/20/24 1034  BP: 114/70   119/76  Pulse: 60   63  Resp: 18   16  Temp: 98.2 F (36.8 C)   98.5 F (36.9 C)  TempSrc: Oral   Oral  SpO2: 95%   95%  Weight:   82 kg   Height:  5' 10 (1.778 m)     No intake or output data in the 24 hours ending 03/20/24 1206 Filed Weights   03/20/24 0828  Weight: 82 kg    Examination:  General exam: Appears calm and comfortable, deconditioned, not in any acute distress Respiratory system: Clear to auscultation. Respiratory effort normal.  RR 18 Cardiovascular system: S1 & S2 heard, RRR. No JVD, murmurs, rubs, gallops or clicks.  Gastrointestinal system: Abdomen is non distended, soft and non tender. Normal  bowel sounds heard. Central nervous system: Alert and oriented X 3. No focal neurological deficits. Extremities: Amputated 3rd and 4th toes, visible bone with significant erythema noted with pus. Skin: No rashes, lesions or ulcers Psychiatry: Judgement and insight appear normal. Mood & affect appropriate.     Data Reviewed: I have personally reviewed following labs and imaging studies  CBC: Recent Labs  Lab 03/17/24 1701 03/19/24 1309 03/20/24 0022  WBC 11.5* 9.5 8.0  NEUTROABS 9.4* 7.3  --   HGB 11.1* 10.2* 9.8*  HCT 34.8* 33.1* 30.9*  MCV 97.8 99.7 96.9  PLT 145* 148* 143*   Basic Metabolic Panel: Recent Labs  Lab 03/17/24 1701 03/19/24 1309 03/20/24 0022  NA 132* 137 136  K 4.3 3.6 3.8  CL 95* 97* 100  CO2 23 25 26   GLUCOSE 139* 117* 124*  BUN 18 12 16   CREATININE 5.94* 4.43* 5.18*  CALCIUM   10.1 8.9 8.6*  PHOS  --   --  2.9   GFR: Estimated Creatinine Clearance: 14.7 mL/min (A) (by C-G formula based on SCr of 5.18 mg/dL (H)). Liver Function Tests: Recent Labs  Lab 03/19/24 1309 03/20/24 0022  AST 14*  --   ALT 7  --   ALKPHOS 83  --   BILITOT 0.7  --   PROT 6.7  --   ALBUMIN  2.9* 2.6*   No results for input(s): LIPASE, AMYLASE in the last 168 hours. No results for input(s): AMMONIA in the last 168 hours. Coagulation Profile: No results for input(s): INR, PROTIME in the last 168 hours. Cardiac Enzymes: No results for input(s): CKTOTAL, CKMB, CKMBINDEX, TROPONINI in the last 168 hours. BNP (last 3 results) No results for input(s): PROBNP in the last 8760 hours. HbA1C: No results for input(s): HGBA1C in the last 72 hours. CBG: No results for input(s): GLUCAP in the last 168 hours. Lipid Profile: No results for input(s): CHOL, HDL, LDLCALC, TRIG, CHOLHDL, LDLDIRECT in the last 72 hours. Thyroid  Function Tests: No results for input(s): TSH, T4TOTAL, FREET4, T3FREE, THYROIDAB in the last 72 hours. Anemia  Panel: No results for input(s): VITAMINB12, FOLATE, FERRITIN, TIBC, IRON, RETICCTPCT in the last 72 hours. Sepsis Labs: Recent Labs  Lab 03/19/24 1327  LATICACIDVEN 1.4    Recent Results (from the past 240 hours)  Aerobic Culture w Gram Stain (superficial specimen)     Status: None   Collection Time: 03/17/24  7:59 PM   Specimen: Foot; Wound  Result Value Ref Range Status   Specimen Description   Final    FOOT Performed at [facility], [address] [place], [place] [address]    Special Requests   Final    NONE Performed at [facility], [address] [place], [place] [address]    Gram Stain   Final    RARE WBC PRESENT,BOTH PMN AND MONONUCLEAR MODERATE GRAM POSITIVE COCCI IN PAIRS IN CHAINS Performed at [facility], [address] [place], [place] [address]    Culture   Final    ABUNDANT METHICILLIN RESISTANT STAPHYLOCOCCUS AUREUS   Report Status 03/20/2024 FINAL  Final  Blood Cultures x 2 sites     Status: None (Preliminary result)   Collection Time: 03/19/24 12:43 PM   Specimen: BLOOD LEFT HAND  Result Value Ref Range Status   Specimen Description BLOOD LEFT HAND  Final   Special Requests   Final    BOTTLES DRAWN AEROBIC AND ANAEROBIC Blood Culture results may not be optimal due to an inadequate volume of blood received in culture bottles   Culture   Final    NO GROWTH < 24 HOURS Performed at [facility], [address], [place], [place] [address]    Report Status PENDING  Incomplete  Blood Cultures x 2 sites     Status: None (Preliminary result)   Collection Time: 03/19/24  5:09 PM   Specimen: BLOOD  Result Value Ref Range Status   Specimen Description BLOOD LEFT ANTECUBITAL  Final   Special Requests   Final    BOTTLES DRAWN AEROBIC AND ANAEROBIC Blood Culture results may not be optimal due to an inadequate volume of blood received in culture bottles   Culture   Final    NO GROWTH < 24 HOURS Performed at [facility], [address], [place], [place] [address]    Report Status PENDING  Incomplete  SARS Coronavirus 2 by  RT PCR (hospital order, performed in [facility] hospital lab) *cepheid single result test* Anterior Nasal Swab     Status: None   Collection Time: 03/19/24  6:22 PM   Specimen: Anterior Nasal Swab  Result Value Ref Range Status   SARS Coronavirus 2 by RT PCR NEGATIVE NEGATIVE Final    Comment: Performed at [facility], [address] [place], [place] [address]  Respiratory (~20 pathogens) panel by PCR     Status: None   Collection Time: 03/19/24  6:22 PM   Specimen: Nasopharyngeal Swab; Respiratory  Result Value Ref Range Status   Adenovirus NOT DETECTED NOT DETECTED Final   Coronavirus 229E NOT DETECTED NOT DETECTED Final    Comment: (NOTE) The Coronavirus on the Respiratory Panel, DOES NOT test for the novel  Coronavirus (2019 nCoV)    Coronavirus HKU1 NOT DETECTED NOT DETECTED Final   Coronavirus NL63 NOT DETECTED NOT DETECTED Final   Coronavirus OC43 NOT DETECTED NOT DETECTED Final   Metapneumovirus NOT DETECTED NOT DETECTED Final   Rhinovirus / Enterovirus NOT DETECTED NOT DETECTED Final   Influenza A NOT DETECTED NOT DETECTED Final   Influenza B NOT DETECTED NOT DETECTED Final   Parainfluenza Virus 1 NOT DETECTED NOT DETECTED Final   Parainfluenza Virus 2 NOT DETECTED NOT DETECTED Final   Parainfluenza Virus 3 NOT DETECTED NOT DETECTED Final   Parainfluenza Virus 4 NOT DETECTED NOT DETECTED Final   Respiratory Syncytial Virus NOT DETECTED NOT DETECTED Final   Bordetella pertussis NOT DETECTED NOT DETECTED Final   Bordetella Parapertussis NOT DETECTED NOT DETECTED Final   Chlamydophila pneumoniae NOT DETECTED NOT DETECTED Final   Mycoplasma pneumoniae NOT DETECTED NOT DETECTED Final    Comment: Performed at [facility], [address], [place], [place] [address]    Radiology Studies: VAS US  ABI WITH/WO TBI Result Date: 03/19/2024  LOWER EXTREMITY DOPPLER STUDY  Patient Name:  KEVIS QU  Date of Exam:   03/19/2024 Medical Rec #: [id number]        Accession #:    [id number] Date of Birth: 11/25/1960        Patient Gender: M Patient Age:   18 years Exam Location:  [facility] Procedure:      VAS US  ABI WITH/WO TBI Referring Phys: [facility] --------------------------------------------------------------------------------  Indications: non-healing amputation site (2nd and 3rd toes of LLE) High Risk Factors: Hypertension, hyperlipidemia, Diabetes, no history of                    smoking. Other Factors: Left 2nd and 3rd toe amputation on 02/19/2024, ESRD (HD).  Vascular Interventions: Left anterior tibial artery laser atherectomy, shockwave                         lithotripsy, and balloon angioplasty on 01/30/2024. Performing Technologist: Leigh Rom RVT, RDMS  Examination Guidelines: A complete evaluation includes at minimum, Doppler waveform signals and systolic blood pressure reading at the level of bilateral brachial, anterior tibial, and posterior tibial arteries, when vessel segments are accessible. Bilateral testing is considered an integral part of a complete examination. Photoelectric Plethysmograph (PPG) waveforms and toe systolic pressure readings are included as required and additional duplex testing as needed. Limited examinations for reoccurring indications may be performed as noted.  ABI Findings: +---------+------------------+-----+-------------------+----------------+ Right    Rt Pressure (mmHg)IndexWaveform           Comment          +---------+------------------+-----+-------------------+----------------+  PTA                             dampened monophasicnon compressible +---------+------------------+-----+-------------------+----------------+ DP                              monophasic         non compressible +---------+------------------+-----+-------------------+----------------+ Great Toe0                 0.00 Absent                               +---------+------------------+-----+-------------------+----------------+ +---------+------------------+-----+----------+----------------+ Left     Lt Pressure (mmHg)IndexWaveform  Comment          +---------+------------------+-----+----------+----------------+ Brachial 165                    biphasic                   +---------+------------------+-----+----------+----------------+ PTA                             monophasicnon compressible +---------+------------------+-----+----------+----------------+ DP                              monophasicnon compressible +---------+------------------+-----+----------+----------------+ Great Toe0                 0.00 Absent                     +---------+------------------+-----+----------+----------------+ +-------+-----------+-----------+------------+------------+ ABI/TBIToday's ABIToday's TBIPrevious ABIPrevious TBI +-------+-----------+-----------+------------+------------+ Right  [place]         absent     [place]          absent       +-------+-----------+-----------+------------+------------+ Left   [place]         absent     [place]          absent       +-------+-----------+-----------+------------+------------+  Bilateral ABIs and TBIs appear essentially unchanged.  Summary: Right: Resting right ankle-brachial index indicates noncompressible right lower extremity arteries. The right toe-brachial index is absent.  Left: Resting left ankle-brachial index indicates noncompressible left lower extremity arteries. The left toe-brachial index is absent.  *See table(s) above for measurements and observations.  Electronically signed by Penne Colorado MD on 03/19/2024 at 6:35:59 PM.    Final    VAS US  LOWER EXTREMITY ARTERIAL DUPLEX Result Date: 03/19/2024 LOWER EXTREMITY ARTERIAL DUPLEX STUDY Patient Name:  EDWIN BAINES  Date of Exam:   03/19/2024 Medical Rec #: [id number]        Accession #:    [id number] Date of Birth:  27-Nov-1960        Patient Gender: M Patient Age:   64 years Exam [facility]  [facility] Procedure:      VAS US  LOWER EXTREMITY ARTERIAL DUPLEX Referring Phys: [facility] --------------------------------------------------------------------------------  Indications: Non healing amputation site (LLE). High Risk Factors: Hypertension, hyperlipidemia, Diabetes, no history of                    smoking. Other Factors: Left 2nd and 3rd toe amputation on 02/19/2024, ESRD (HD).  Vascular Interventions: Left anterior tibial artery laser atherectomy, shockwave  lithotripsy, and balloon angioplasty on 01/30/2024. Current ABI:            non compressible ABI absent TBI, bilaterally Comparison Study: No previous Performing Technologist: Jody Hill RVT, RDMS  Examination Guidelines: A complete evaluation includes B-mode imaging, spectral Doppler, color Doppler, and power Doppler as needed of all accessible portions of each vessel. Bilateral testing is considered an integral part of a complete examination. Limited examinations for reoccurring indications may be performed as noted.   +----------+--------+-----+--------+----------+--------+ LEFT      PSV cm/sRatioStenosisWaveform  Comments +----------+--------+-----+--------+----------+--------+ CFA Prox  61                   biphasic           +----------+--------+-----+--------+----------+--------+ CFA Distal52                   triphasic          +----------+--------+-----+--------+----------+--------+ DFA       44                   biphasic           +----------+--------+-----+--------+----------+--------+ SFA Prox  57                   biphasic           +----------+--------+-----+--------+----------+--------+ SFA Mid   48                   biphasic           +----------+--------+-----+--------+----------+--------+ SFA Distal42                   biphasic            +----------+--------+-----+--------+----------+--------+ POP Prox  33                   biphasic           +----------+--------+-----+--------+----------+--------+ POP Distal35                   biphasic           +----------+--------+-----+--------+----------+--------+ TP Trunk  56                   biphasic           +----------+--------+-----+--------+----------+--------+ ATA Prox  49                   biphasic           +----------+--------+-----+--------+----------+--------+ ATA Mid   101                  biphasic           +----------+--------+-----+--------+----------+--------+ ATA Distal27                   biphasic           +----------+--------+-----+--------+----------+--------+ PTA Prox  22                   biphasic           +----------+--------+-----+--------+----------+--------+ PTA Mid   44                   biphasic           +----------+--------+-----+--------+----------+--------+ PTA Distal27                   monophasic         +----------+--------+-----+--------+----------+--------+ PERO  Prox 18                   biphasic           +----------+--------+-----+--------+----------+--------+ PERO Mid  36                   biphasic           +----------+--------+-----+--------+----------+--------+ DP        32                   monophasic         +----------+--------+-----+--------+----------+--------+  Summary: Left: Arterial wall calcifications throughout lower extremity. No evidence of significant stenosis seen on this exam.  See table(s) above for measurements and observations. Electronically signed by Penne Colorado MD on 03/19/2024 at 6:35:20 PM.    Final    Scheduled Meds:  amitriptyline   10 mg Oral QHS   amLODipine   10 mg Oral q morning   atorvastatin   20 mg Oral q AM   calcium  acetate  1,334 mg Oral TID WC   Chlorhexidine  Gluconate Cloth  6 each Topical Q0600   gabapentin   100 mg Oral QHS   guaiFENesin    600 mg Oral BID   heparin   5,000 Units Subcutaneous Q8H   hydrALAZINE   10 mg Oral Q8H   levothyroxine   50 mcg Oral Q0600   metoprolol  tartrate  50 mg Oral BID   pantoprazole   40 mg Oral Daily   sodium chloride  flush  3 mL Intravenous Q12H   Continuous Infusions:  ampicillin-sulbactam (UNASYN) IV     vancomycin      vancomycin  1,500 mg (03/20/24 1053)     LOS: 1 day    Time spent: 50 mins   Darcel Dawley, MD [facility]   If 7PM-7AM, please contact night-coverage

## 2024-03-21 ENCOUNTER — Encounter (HOSPITAL_COMMUNITY)

## 2024-03-21 ENCOUNTER — Ambulatory Visit

## 2024-03-21 ENCOUNTER — Encounter (HOSPITAL_COMMUNITY): Payer: Self-pay | Admitting: Vascular Surgery

## 2024-03-21 DIAGNOSIS — Z9889 Other specified postprocedural states: Secondary | ICD-10-CM | POA: Diagnosis not present

## 2024-03-21 DIAGNOSIS — T8149XA Infection following a procedure, other surgical site, initial encounter: Secondary | ICD-10-CM | POA: Diagnosis not present

## 2024-03-21 LAB — RENAL FUNCTION PANEL
Albumin: 2.4 g/dL — ABNORMAL LOW (ref 3.5–5.0)
Anion gap: 13 (ref 5–15)
BUN: 9 mg/dL (ref 8–23)
CO2: 25 mmol/L (ref 22–32)
Calcium: 8.1 mg/dL — ABNORMAL LOW (ref 8.9–10.3)
Chloride: 94 mmol/L — ABNORMAL LOW (ref 98–111)
Creatinine, Ser: 3.04 mg/dL — ABNORMAL HIGH (ref 0.61–1.24)
GFR, Estimated: 22 mL/min — ABNORMAL LOW (ref >60)
Glucose, Bld: 147 mg/dL — ABNORMAL HIGH (ref 70–99)
Phosphorus: 1.7 mg/dL — ABNORMAL LOW (ref 2.5–4.6)
Potassium: 3.2 mmol/L — ABNORMAL LOW (ref 3.5–5.1)
Sodium: 132 mmol/L — ABNORMAL LOW (ref 135–145)

## 2024-03-21 LAB — LIPID PANEL
Cholesterol: 95 mg/dL (ref 0–200)
HDL: 34 mg/dL — ABNORMAL LOW (ref >40)
LDL Cholesterol: 40 mg/dL (ref 0–99)
Total CHOL/HDL Ratio: 2.8 ratio
Triglycerides: 106 mg/dL (ref <150)
VLDL: 21 mg/dL (ref 0–40)

## 2024-03-21 LAB — HEPATITIS B SURFACE ANTIBODY, QUANTITATIVE: Hep B S AB Quant (Post): 2353 m[IU]/mL

## 2024-03-21 LAB — MISC LABCORP TEST (SEND OUT): Labcorp test code: 6510

## 2024-03-21 MED ORDER — HEPARIN SODIUM (PORCINE) 1000 UNIT/ML IJ SOLN
INTRAMUSCULAR | Status: AC
Start: 2024-03-21 — End: 2024-03-21
  Filled 2024-03-21: qty 2

## 2024-03-21 MED ORDER — POTASSIUM PHOSPHATES 15 MMOLE/5ML IV SOLN
30.0000 mmol | Freq: Once | INTRAVENOUS | Status: AC
  Administered 2024-03-21: 30 mmol via INTRAVENOUS
  Filled 2024-03-21: qty 10

## 2024-03-21 MED ORDER — ATORVASTATIN CALCIUM 40 MG PO TABS
40.0000 mg | ORAL_TABLET | Freq: Every day | ORAL | Status: DC
  Administered 2024-03-22 – 2024-03-28 (×7): 40 mg via ORAL
  Filled 2024-03-21 (×2): qty 1
  Filled 2024-03-21 (×2): qty 4
  Filled 2024-03-21 (×3): qty 1

## 2024-03-21 NOTE — Progress Notes (Signed)
   03/21/24 0100  Vitals  Temp 98.1 F (36.7 C)  Temp Source Oral  BP (!) 128/58  MAP (mmHg) 79  BP Location Left Arm  BP Method Automatic  Patient Position (if appropriate) Lying  Pulse Rate 76  Pulse Rate Source Monitor  ECG Heart Rate 75  Resp 13  Oxygen Therapy  SpO2 98 %  During Treatment Monitoring  Blood Flow Rate (mL/min) 199 mL/min  Arterial Pressure (mmHg) -99.19 mmHg  Venous Pressure (mmHg) 83.02 mmHg  TMP (mmHg) 13.73 mmHg  Ultrafiltration Rate (mL/min) 777 mL/min  Dialysate Flow Rate (mL/min) 299 ml/min  Dialysate Potassium Concentration 3  Dialysate Calcium  Concentration 2.5  Duration of HD Treatment -hour(s) 3.5 hour(s)  Cumulative Fluid Removed (mL) per Treatment  1700.14  HD Safety Checks Performed Yes  Intra-Hemodialysis Comments Tx completed  Post Treatment  Dialyzer Clearance Clear  Liters Processed 73.5  Fluid Removed (mL) 1700 mL  Tolerated HD Treatment Yes  Hemodialysis Catheter Right Internal jugular Double lumen Permanent (Tunneled)  Placement Date/Time: 01/10/24 1548   Placed prior to admission: No  Serial / Lot #: [id number]  Expiration Date: 08/10/28  Time Out: Correct patient;Correct site;Correct procedure  Maximum sterile barrier precautions: Hand hygiene;Large sterile sheet;S...  Site Condition No complications  Blue Lumen Status Flushed;Heparin  locked  Red Lumen Status Flushed;Heparin  locked  Catheter fill solution Heparin  1000 units/ml  Catheter fill volume (Arterial) 1.6 cc  Catheter fill volume (Venous) 1.6  Dressing Type Transparent  Dressing Status Clean, Dry, Intact  Interventions New dressing;Dressing changed  Drainage Description None  Dressing Change Due 03/27/24  Post treatment catheter status Capped and Clamped

## 2024-03-21 NOTE — Progress Notes (Signed)
 PHARMACIST LIPID MONITORING   [name] [name] is a 63 y.o. male admitted on 03/19/2024 with  Left lower extremity critical limb ischemia with tissue loss, nonhealing toe amputation site s/p aortogram, LLE angiogram, may need BKA.SABRA  Pharmacy has been consulted to optimize lipid-lowering therapy with the indication of secondary prevention for clinical ASCVD.  Recent Labs:  Lipid Panel (last 6 months):   Lab Results  Component Value Date   CHOL 95 03/21/2024   TRIG 106 03/21/2024   HDL 34 (L) 03/21/2024   CHOLHDL 2.8 03/21/2024   VLDL 21 03/21/2024   LDLCALC 40 03/21/2024    Hepatic function panel (last 6 months):   Lab Results  Component Value Date   AST 14 (L) 03/19/2024   ALT 7 03/19/2024   ALKPHOS 83 03/19/2024   BILITOT 0.7 03/19/2024    SCr (since admission):   Serum creatinine: 3.04 mg/dL (H) 89/90/74 9699 Estimated creatinine clearance: 25 mL/min (A)  Current therapy and lipid therapy tolerance Current lipid-lowering therapy: atorvastatin  20 mg Previous lipid-lowering therapies (if applicable): none Documented or reported allergies or intolerances to lipid-lowering therapies (if applicable): none  Assessment:   LDL is at goal but not on high intensity statin. Patient agrees with increasing for pleiotropic effects.  Plan:    1.Statin intensity (high intensity recommended for all patients regardless of the LDL):  Add or increase statin to high intensity.  2.Add ezetimibe (if any one of the following):   Not indicated at this time.  3.Refer to lipid clinic:   No  4.Follow-up with:  Primary care provider - Kip Righter, MD  5.Follow-up labs after discharge:  Changes in lipid therapy were made. Check a lipid panel in 8-12 weeks then annually.      Jinnie Door, PharmD, BCPS, BCCP Clinical Pharmacist  Please check AMION for all [facility] phone numbers After 10:00 PM, call Main Pharmacy [phone]

## 2024-03-21 NOTE — Progress Notes (Signed)
 MRSA nasal swab done. Lab informed nurse the results are positive for MRSA.    Kristene Sotero BRAVO, RN

## 2024-03-21 NOTE — Progress Notes (Signed)
 Patient has taken off heart monitor and refuses to put it back on. Says he doesn't like wires all around him.  Will continue to monitor.

## 2024-03-21 NOTE — Progress Notes (Signed)
 PROGRESS NOTE    [name] [name]  FMW:[id number] DOB: 10-26-1960 DOA: 03/19/2024 PCP: Kip Righter, MD   Brief Narrative: This 63 y.o. male with medical history significant of hypertension, hyperlipidemia, diabetes mellitus type 2, ESRD on HD, hypothyroidism who presents with post-operative foot pain. 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 Patient noted that surgical site wound had opened up and he had redness present on the forefoot. He followed up with Dr. Alona today and due to to his symptoms and was advised to come to the hospital for further evaluation and need of IV antibiotics.  Patient was admitted for postoperative wound infection.  Podiatry is consulted,  vascular surgery is consulted.  Patient is s/p  angiogram followed by hemodialysis.  Assessment & Plan:   Principal Problem:   Postoperative wound infection Active Problems:   ESRD on dialysis (HCC)   PVD (peripheral vascular disease)   Anemia of chronic disease   Thrombocytopenia   Essential hypertension   Controlled type 2 diabetes mellitus without complication, without long-term current use of insulin  (HCC)   Hypothyroidism   Hyperlipidemia associated with type 2 diabetes mellitus (HCC)   GERD (gastroesophageal reflux disease)   Pyogenic inflammation of bone (HCC)   Wound dehiscence, surgical  Postoperative wound infection: Patient presented with increasing pain and erythema of his postoperative site. He recently underwent 2nd and 3rd left toe amputation due to gangrene on 02/14/2024 by Dr. Alona.   MRI noted full-thickness wound at the tip of the second metatarsal head. Patient had been started on doxycycline . - Blood cultures > No growth so far  - ESR and CRP elevated. - Hold doxycycline  - Continue empiric antibiotics vancomycin  and ampicillin. - Oxycodone  as needed for pain. - Podiatry consulted, recommended transmetatarsal amputation tomorrow pending patient's  willingness to proceed. -Appreciate vascular surgery, status post angiogram, patient high risk for nonhealing even a TMA level, may ultimately require more proximal amputation if fails to heal.   ESRD on HD: Patient follows Monday, Wednesday, Friday schedule.  Last hemodialysis session on 10/6.   Labs noted potassium 3.6, CO2 25, BUN 12, and creatinine 4.43. - Nephrology consulted for need of dialysis in a.m.   Peripheral vascular disease: - Held aspirin  and Plavix  in case of need of procedure. - Patient s/p angiogram of left lower extremity 10/8.  Anemia of chronic disease: Chronic.  Hemoglobin noted to be 10.2 which appears similar to prior. - No obvious visible bleeding.  Continue to monitor   Thrombocytopenia: Platelet count 143 which appears similar to most recent checks. - Continue to monitor   Essential hypertension: Blood pressure elevated at 155/75. - Continue metoprolol  and amlodipine .   Controlled diabetes mellitus type 2 without long-term use of insulin  Last hemoglobin A1c noted to be 5.1.  Patient is not on any diabetic medications at baseline. - Continue renal and carb modified diet - Continue gabapentin .   Hypothyroidism: - Continue levothyroxine    Hyperlipidemia: - Continue atorvastatin     GERD: - Continue PPI  Hypokalemia/hypophosphatemia: Replaced.  Continue to monitor  DVT prophylaxis: Heparin  sq Code Status: Full code Family Communication: No family at bed side. Disposition Plan:    Status is: Inpatient Remains inpatient appropriate because: Severity of illness.   Consultants:  Vascular surgery Podiatry Nephrology  Procedures: Scheduled angiography followed by hemodialysis Antimicrobials:  Anti-infectives (From admission, onward)    Start     Dose/Rate Route Frequency Ordered Stop   03/20/24 1800  vancomycin  (VANCOCIN ) IVPB  1000 mg/200 mL premix        1,000 mg 200 mL/hr over 60 Minutes Intravenous Every M-W-F (Hemodialysis) 03/20/24  1114     03/20/24 1700  vancomycin  (VANCOCIN ) IVPB 1000 mg/200 mL premix  Status:  Discontinued        1,000 mg 200 mL/hr over 60 Minutes Intravenous Every M-W-F (Hemodialysis) 03/19/24 1642 03/20/24 0821   03/20/24 1700  Ampicillin-Sulbactam (UNASYN) 3 g in sodium chloride  0.9 % 100 mL IVPB  Status:  Discontinued        3 g 200 mL/hr over 30 Minutes Intravenous Every 24 hours 03/19/24 1642 03/20/24 0824   03/20/24 1700  Ampicillin-Sulbactam (UNASYN) 3 g in sodium chloride  0.9 % 100 mL IVPB        3 g 200 mL/hr over 30 Minutes Intravenous Every 12 hours 03/20/24 0824     03/20/24 0915  vancomycin  (VANCOREADY) IVPB 1500 mg/300 mL        1,500 mg 150 mL/hr over 120 Minutes Intravenous  Once 03/20/24 0821 03/20/24 1253   03/20/24 0821  vancomycin  variable dose per unstable renal function (pharmacist dosing)  Status:  Discontinued         Does not apply See admin instructions 03/20/24 0821 03/20/24 1114   03/19/24 1530  Ampicillin-Sulbactam (UNASYN) 3 g in sodium chloride  0.9 % 100 mL IVPB       Placed in And Linked Group   3 g 200 mL/hr over 30 Minutes Intravenous  Once 03/19/24 1516 03/20/24 0817   03/19/24 1530  vancomycin  (VANCOCIN ) IVPB 1000 mg/200 mL premix  Status:  Discontinued       Placed in And Linked Group   1,000 mg 200 mL/hr over 60 Minutes Intravenous  Once 03/19/24 1516 03/20/24 9178       Subjective: Patient was seen and examined at bedside.  Overnight events noted. Patient is not sure about having amputation, states he is going to discuss with his mother.  Objective: Vitals:   03/21/24 0030 03/21/24 0100 03/21/24 0143 03/21/24 0448  BP: (!) 144/77 (!) 128/58 (!) 157/78 (!) 147/63  Pulse: (!) 59 76 79 60  Resp: (!) 24 13 (!) 24 18  Temp:  98.1 F (36.7 C) 98.3 F (36.8 C) 98.4 F (36.9 C)  TempSrc:  Oral Axillary Oral  SpO2: 99% 98% 100% 98%  Weight:      Height:        Intake/Output Summary (Last 24 hours) at 03/21/2024 1123 Last data filed at 03/21/2024  0520 Gross per 24 hour  Intake 780 ml  Output 1900 ml  Net -1120 ml   Filed Weights   03/20/24 0828  Weight: 82 kg    Examination:  General exam: Appears calm and comfortable, deconditioned, not in any acute distress Respiratory system: CTA Bilaterally . Respiratory effort normal.  RR 15 Cardiovascular system: S1 & S2 heard, RRR. No JVD, murmurs, rubs, gallops or clicks.  Gastrointestinal system: Abdomen is non distended, soft and non tender. Normal bowel sounds heard. Central nervous system: Alert and oriented X 3. No focal neurological deficits. Extremities: Amputated 3rd and 4th toes, visible bone with significant erythema noted with pus. Skin: No rashes, lesions or ulcers Psychiatry: Judgement and insight appear normal. Mood & affect appropriate.     Data Reviewed: I have personally reviewed following labs and imaging studies  CBC: Recent Labs  Lab 03/17/24 1701 03/19/24 1309 03/20/24 0022  WBC 11.5* 9.5 8.0  NEUTROABS 9.4* 7.3  --   HGB 11.1*  10.2* 9.8*  HCT 34.8* 33.1* 30.9*  MCV 97.8 99.7 96.9  PLT 145* 148* 143*   Basic Metabolic Panel: Recent Labs  Lab 03/17/24 1701 03/19/24 1309 03/20/24 0022 03/21/24 0300  NA 132* 137 136 132*  K 4.3 3.6 3.8 3.2*  CL 95* 97* 100 94*  CO2 23 25 26 25   GLUCOSE 139* 117* 124* 147*  BUN 18 12 16 9   CREATININE 5.94* 4.43* 5.18* 3.04*  CALCIUM  10.1 8.9 8.6* 8.1*  PHOS  --   --  2.9 1.7*   GFR: Estimated Creatinine Clearance: 25 mL/min (A) (by C-G formula based on SCr of 3.04 mg/dL (H)). Liver Function Tests: Recent Labs  Lab 03/19/24 1309 03/20/24 0022 03/21/24 0300  AST 14*  --   --   ALT 7  --   --   ALKPHOS 83  --   --   BILITOT 0.7  --   --   PROT 6.7  --   --   ALBUMIN  2.9* 2.6* 2.4*   No results for input(s): LIPASE, AMYLASE in the last 168 hours. No results for input(s): AMMONIA in the last 168 hours. Coagulation Profile: No results for input(s): INR, PROTIME in the last 168  hours. Cardiac Enzymes: No results for input(s): CKTOTAL, CKMB, CKMBINDEX, TROPONINI in the last 168 hours. BNP (last 3 results) No results for input(s): PROBNP in the last 8760 hours. HbA1C: No results for input(s): HGBA1C in the last 72 hours. CBG: Recent Labs  Lab 03/20/24 1555  GLUCAP 81   Lipid Profile: Recent Labs    03/21/24 0300  CHOL 95  HDL 34*  LDLCALC 40  TRIG 893  CHOLHDL 2.8   Thyroid  Function Tests: No results for input(s): TSH, T4TOTAL, FREET4, T3FREE, THYROIDAB in the last 72 hours. Anemia Panel: No results for input(s): VITAMINB12, FOLATE, FERRITIN, TIBC, IRON, RETICCTPCT in the last 72 hours. Sepsis Labs: Recent Labs  Lab 03/19/24 1327  LATICACIDVEN 1.4    Recent Results (from the past 240 hours)  Aerobic Culture w Gram Stain (superficial specimen)     Status: None   Collection Time: 03/17/24  7:59 PM   Specimen: Foot; Wound  Result Value Ref Range Status   Specimen Description   Final    FOOT Performed at [facility], [address], [place], [place] [address]    Special Requests   Final    NONE Performed at [facility], [address], [place], [place] [address]    Gram Stain   Final    RARE WBC PRESENT,BOTH PMN AND MONONUCLEAR MODERATE GRAM POSITIVE COCCI IN PAIRS IN CHAINS Performed at [facility], [address] [place], [place] [address]    Culture   Final    ABUNDANT METHICILLIN RESISTANT STAPHYLOCOCCUS AUREUS   Report Status 03/20/2024 FINAL  Final  Blood Cultures x 2 sites     Status: None (Preliminary result)   Collection Time: 03/19/24 12:43 PM   Specimen: BLOOD LEFT HAND  Result Value Ref Range Status   Specimen Description BLOOD LEFT HAND  Final   Special Requests   Final    BOTTLES DRAWN AEROBIC AND ANAEROBIC Blood Culture results may not be optimal due to an inadequate volume of blood received in culture bottles   Culture   Final    NO GROWTH 2 DAYS Performed at [facility], [address], [place], [place] [address]    Report Status PENDING  Incomplete  Blood Cultures x 2 sites  Status: None (Preliminary result)   Collection Time: 03/19/24  5:09 PM   Specimen: BLOOD  Result Value Ref Range Status   Specimen Description BLOOD LEFT ANTECUBITAL  Final   Special Requests   Final    BOTTLES DRAWN AEROBIC AND ANAEROBIC Blood Culture results may not be optimal due to an inadequate volume of blood received in culture bottles   Culture   Final    NO GROWTH 2 DAYS Performed at [facility], [address] [place], [place] [address]    Report Status PENDING  Incomplete  SARS Coronavirus 2 by RT PCR (hospital order, performed in [facility] hospital lab) *cepheid single result test* Anterior Nasal Swab     Status: None   Collection Time: 03/19/24  6:22 PM   Specimen: Anterior Nasal Swab  Result Value Ref Range Status   SARS Coronavirus 2 by RT PCR NEGATIVE NEGATIVE Final    Comment: Performed at [facility], [address] [place], [place] [address]  Respiratory (~20 pathogens) panel by PCR     Status: None   Collection Time: 03/19/24  6:22 PM   Specimen: Nasopharyngeal Swab; Respiratory  Result Value Ref Range Status   Adenovirus NOT DETECTED NOT DETECTED Final   Coronavirus 229E NOT DETECTED NOT DETECTED Final    Comment: (NOTE) The Coronavirus on the Respiratory Panel, DOES NOT test for the novel  Coronavirus (2019 nCoV)    Coronavirus HKU1 NOT DETECTED NOT DETECTED Final   Coronavirus NL63 NOT DETECTED NOT DETECTED Final   Coronavirus OC43 NOT DETECTED NOT DETECTED Final   Metapneumovirus NOT DETECTED NOT DETECTED Final   Rhinovirus / Enterovirus NOT DETECTED NOT DETECTED Final   Influenza A NOT DETECTED NOT DETECTED Final   Influenza B NOT DETECTED NOT DETECTED Final   Parainfluenza Virus 1 NOT DETECTED NOT DETECTED Final   Parainfluenza Virus 2 NOT DETECTED NOT DETECTED Final   Parainfluenza Virus 3 NOT DETECTED NOT DETECTED  Final   Parainfluenza Virus 4 NOT DETECTED NOT DETECTED Final   Respiratory Syncytial Virus NOT DETECTED NOT DETECTED Final   Bordetella pertussis NOT DETECTED NOT DETECTED Final   Bordetella Parapertussis NOT DETECTED NOT DETECTED Final   Chlamydophila pneumoniae NOT DETECTED NOT DETECTED Final   Mycoplasma pneumoniae NOT DETECTED NOT DETECTED Final    Comment: Performed at [facility], [address], [place], [place] [address]  MRSA Next Gen by PCR, Nasal     Status: Abnormal   Collection Time: 03/20/24  8:21 PM   Specimen: Nasal Mucosa; Nasal Swab  Result Value Ref Range Status   MRSA by PCR Next Gen DETECTED (A) NOT DETECTED Final    Comment: CRITICAL RESULT CALLED TO, READ BACK BY AND VERIFIED WITH: RN SOTERO BUNDE [id number] [id number] BY J RAZZAK, MT (NOTE) The GeneXpert MRSA Assay (FDA approved for NASAL specimens only), is one component of a comprehensive MRSA colonization surveillance program. It is not intended to diagnose MRSA infection nor to guide or monitor treatment for MRSA infections. Test performance is not FDA approved in patients less than 70 years old. Performed at [facility], [address] [place], [place] [address]     Radiology Studies: PERIPHERAL VASCULAR CATHETERIZATION Result Date: 03/20/2024 Patient name: ALVER LEETE MRN: [id number] DOB: [birth year] Sex: male 03/20/2024 Pre-operative Diagnosis: Left lower extremity critical limb ischemia with tissue loss, nonhealing toe amputation site Post-operative diagnosis:  Same Surgeon:  Fonda FORBES Rim, MD Procedure Performed: 1.  Ultrasound-guided  micropuncture access of the right common femoral artery in retrograde fashion 2.  Aortogram 3.  Second-order cannulation, left lower extremity angiogram 4.  Third cannulation, left lower extremity angiogram 5.  Moderate sedation time 12 minutes, contrast volume 45 mL Indications: Patient is a 63 year old male with known left lower extremity critical and ischemia  with tissue loss.  He underwent tibial intervention a month ago, but has had poor wound healing at the toe amputation site.  He presents today for repeat angiography in an effort to improve distal perfusion for wound healing. Findings: Sluggish flow indicative of heart failure Aortogram: Widely patent infrarenal aorta with no flow-limiting stenosis in the aortoiliac segments bilaterally On the left: Widely patent common femoral artery, profunda, superficial femoral artery, popliteal artery. Severe calcific disease of the tibial vessels with two-vessel runoff, anterior tibial artery, posterior tibial artery.  Severe small vessel disease in the foot.  Procedure:  The patient was identified in the holding area and taken to room 8.  The patient was then placed supine on the table and prepped and draped in the usual sterile fashion.  A time out was called.  Ultrasound was used to evaluate the right common femoral artery.  It was patent .  A digital ultrasound image was acquired.  A micropuncture needle was used to access the right common femoral artery under ultrasound guidance.  An 018 wire was advanced without resistance and a micropuncture sheath was placed.  The 018 wire was removed and a benson wire was placed.  The micropuncture sheath was exchanged for a 5 french sheath.  An omniflush catheter was advanced over the wire to the level of L-1.  An abdominal angiogram was obtained.  Next, using the omniflush catheter and a benson wire, the aortic bifurcation was crossed and the catheter was placed into theleft external iliac artery and left runoff was obtained.  I then moved the catheter into the superficial femoral artery for lower extremity angiography due to issues with contrast resolution due to severe heart failure.  Impression: Severe heart failure, severe small vessel disease.  Prior occluded anterior tibial artery is patent.  Patient has been maximally revascularized.  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 Fonda FORBES Rim MD [facility] Office: [phone]   VAS US  ABI WITH/WO TBI Result Date: 03/19/2024  LOWER EXTREMITY DOPPLER STUDY Patient Name:  KIARA KEEP  Date of Exam:   03/19/2024 Medical Rec #: [id number]        Accession #:    [id number] Date of Birth: 07-29-1960        Patient Gender: M Patient Age:   32 years Exam Location:  [facility] Procedure:      VAS US  ABI WITH/WO TBI Referring Phys: [facility] --------------------------------------------------------------------------------  Indications: non-healing amputation site (2nd and 3rd toes of LLE) High Risk Factors: Hypertension, hyperlipidemia, Diabetes, no history of                    smoking. Other Factors: Left 2nd and 3rd toe amputation on 02/19/2024, ESRD (HD).  Vascular Interventions: Left anterior tibial artery laser atherectomy, shockwave                         lithotripsy, and balloon angioplasty on 01/30/2024. Performing Technologist: Leigh Rom RVT, RDMS  Examination Guidelines: A complete evaluation includes at minimum, Doppler waveform signals and systolic blood pressure reading at the level of bilateral brachial, anterior tibial, and posterior tibial arteries, when vessel segments are accessible. Bilateral testing is considered an integral part of a complete examination. Photoelectric Plethysmograph (PPG) waveforms and toe systolic pressure readings are included as required and additional duplex testing as needed. Limited examinations for reoccurring indications may be performed as noted.  ABI Findings: +---------+------------------+-----+-------------------+----------------+ Right    Rt Pressure (mmHg)IndexWaveform           Comment          +---------+------------------+-----+-------------------+----------------+ PTA                              dampened monophasicnon compressible +---------+------------------+-----+-------------------+----------------+ DP                              monophasic         non compressible +---------+------------------+-----+-------------------+----------------+ Great Toe0                 0.00 Absent                              +---------+------------------+-----+-------------------+----------------+ +---------+------------------+-----+----------+----------------+ Left     Lt Pressure (mmHg)IndexWaveform  Comment          +---------+------------------+-----+----------+----------------+ Brachial 165                    biphasic                   +---------+------------------+-----+----------+----------------+ PTA                             monophasicnon compressible +---------+------------------+-----+----------+----------------+ DP                              monophasicnon compressible +---------+------------------+-----+----------+----------------+ Great Toe0                 0.00 Absent                     +---------+------------------+-----+----------+----------------+ +-------+-----------+-----------+------------+------------+ ABI/TBIToday's ABIToday's TBIPrevious ABIPrevious TBI +-------+-----------+-----------+------------+------------+ Right  [place]         absent     [place]          absent       +-------+-----------+-----------+------------+------------+ Left   [place]         absent     [place]          absent       +-------+-----------+-----------+------------+------------+  Bilateral ABIs and TBIs appear essentially unchanged.  Summary: Right: Resting right ankle-brachial index indicates noncompressible right lower extremity arteries. The right toe-brachial index is absent.  Left: Resting left ankle-brachial index indicates noncompressible left lower extremity arteries. The left toe-brachial index is absent.  *See table(s) above for  measurements and observations.  Electronically signed by Penne Colorado MD on 03/19/2024 at 6:35:59 PM.    Final    VAS US  LOWER EXTREMITY ARTERIAL DUPLEX Result Date: 03/19/2024 LOWER EXTREMITY ARTERIAL DUPLEX STUDY Patient Name:  JOVANNY STEPHANIE  Date of Exam:   03/19/2024 Medical Rec #: [id number]        Accession #:    [id number] Date of Birth: [birth year]  Patient Gender: M Patient Age:   53 years Exam Location:  [facility] Procedure:      VAS US  LOWER EXTREMITY ARTERIAL DUPLEX Referring Phys: [facility] --------------------------------------------------------------------------------  Indications: Non healing amputation site (LLE). High Risk Factors: Hypertension, hyperlipidemia, Diabetes, no history of                    smoking. Other Factors: Left 2nd and 3rd toe amputation on 02/19/2024, ESRD (HD).  Vascular Interventions: Left anterior tibial artery laser atherectomy, shockwave                         lithotripsy, and balloon angioplasty on 01/30/2024. Current ABI:            non compressible ABI absent TBI, bilaterally Comparison Study: No previous Performing Technologist: Jody Hill RVT, RDMS  Examination Guidelines: A complete evaluation includes B-mode imaging, spectral Doppler, color Doppler, and power Doppler as needed of all accessible portions of each vessel. Bilateral testing is considered an integral part of a complete examination. Limited examinations for reoccurring indications may be performed as noted.   +----------+--------+-----+--------+----------+--------+ LEFT      PSV cm/sRatioStenosisWaveform  Comments +----------+--------+-----+--------+----------+--------+ CFA Prox  61                   biphasic           +----------+--------+-----+--------+----------+--------+ CFA Distal52                   triphasic          +----------+--------+-----+--------+----------+--------+ DFA       44                   biphasic            +----------+--------+-----+--------+----------+--------+ SFA Prox  57                   biphasic           +----------+--------+-----+--------+----------+--------+ SFA Mid   48                   biphasic           +----------+--------+-----+--------+----------+--------+ SFA Distal42                   biphasic           +----------+--------+-----+--------+----------+--------+ POP Prox  33                   biphasic           +----------+--------+-----+--------+----------+--------+ POP Distal35                   biphasic           +----------+--------+-----+--------+----------+--------+ TP Trunk  56                   biphasic           +----------+--------+-----+--------+----------+--------+ ATA Prox  49                   biphasic           +----------+--------+-----+--------+----------+--------+ ATA Mid   101                  biphasic           +----------+--------+-----+--------+----------+--------+ ATA Distal27  biphasic           +----------+--------+-----+--------+----------+--------+ PTA Prox  22                   biphasic           +----------+--------+-----+--------+----------+--------+ PTA Mid   44                   biphasic           +----------+--------+-----+--------+----------+--------+ PTA Distal27                   monophasic         +----------+--------+-----+--------+----------+--------+ PERO Prox 18                   biphasic           +----------+--------+-----+--------+----------+--------+ PERO Mid  36                   biphasic           +----------+--------+-----+--------+----------+--------+ DP        32                   monophasic         +----------+--------+-----+--------+----------+--------+  Summary: Left: Arterial wall calcifications throughout lower extremity. No evidence of significant stenosis seen on this exam.  See table(s) above for measurements and observations.  Electronically signed by Penne Colorado MD on 03/19/2024 at 6:35:20 PM.    Final    Scheduled Meds:  amitriptyline   10 mg Oral QHS   amLODipine   10 mg Oral q morning   [START ON 03/22/2024] atorvastatin   40 mg Oral Daily   calcium  acetate  1,334 mg Oral TID WC   Chlorhexidine  Gluconate Cloth  6 each Topical Q0600   gabapentin   100 mg Oral QHS   guaiFENesin   600 mg Oral BID   heparin   5,000 Units Subcutaneous Q8H   hydrALAZINE   10 mg Oral Q8H   levothyroxine   50 mcg Oral Q0600   metoprolol  tartrate  50 mg Oral BID   mupirocin  ointment  1 Application Nasal BID   pantoprazole   40 mg Oral Daily   sodium chloride  flush  3 mL Intravenous Q12H   sodium chloride  flush  3 mL Intravenous Q12H   Continuous Infusions:  sodium chloride      ampicillin-sulbactam (UNASYN) IV Stopped (03/21/24 0515)   potassium PHOSPHATE IVPB (in mmol) 30 mmol (03/21/24 1013)   vancomycin  Stopped (03/21/24 0304)     LOS: 2 days    Time spent: 35 mins   Darcel Dawley, MD [facility]   If 7PM-7AM, please contact night-coverage

## 2024-03-21 NOTE — Progress Notes (Signed)
 [facility] Progress Note   Assessment/ Plan:   Dialysis Orders:  MWF - [facility] 4hrs, BFR 400, DFR Auto 1.5,  EDW 69kg, 3K/ 2.5Ca Heparin  3000 unit bolus and 500 unit mid-run Mircera 150 mcg q2wks - last 9/29   Last Labs: Hgb 9.8, K 3.8, Ca 8.6, P 2.9, Alb 2.6   Assessment/Plan: Non-healing L foot amputation site - On IV ABXs. VVS following. S/p angio 10/8, for TMA Friday with podiatry pending pt agreement.  High risk of BKA in future. ESRD - on HD MWF. Plan for HD per routine schedule Hypertension/volume  - Euvolemic on exam. BP stable. Anemia of CKD - Last ESA dose 9/29, not due yet Secondary Hyperparathyroidism -  Corr Ca ok and phos low. Hold binders for now. Nutrition - Renal diet with fluid restriction  Subjective:    Seen and examined.  Had angio yesterday and dialysis thereafter.  VVS and podiatry notes reviewed.  Plan for TMA tomorrow if pt is willing, high risk for BKA.     Objective:   BP (!) 147/63 (BP Location: Left Arm)   Pulse 60   Temp 98.4 F (36.9 C) (Oral)   Resp 18   Ht 5' 10 (1.778 m)   Wt 82 kg   SpO2 98%   BMI 25.94 kg/m   Intake/Output Summary (Last 24 hours) at 03/21/2024 1116 Last data filed at 03/21/2024 0520 Gross per 24 hour  Intake 780 ml  Output 1900 ml  Net -1120 ml   Weight change:   Physical Exam: Hzw:obpwh in bed CVS: RRR Resp: clear Abd: soft Ext: no LE edmea, L nonhealing toe amputation site  Imaging: PERIPHERAL VASCULAR CATHETERIZATION Result Date: 03/20/2024 Patient name: DERICK SEMINARA MRN: [id number] DOB: 01/20/1961 Sex: male 03/20/2024 Pre-operative Diagnosis: Left lower extremity critical limb ischemia with tissue loss, nonhealing toe amputation site Post-operative diagnosis:  Same Surgeon:  Fonda FORBES Rim, MD Procedure Performed: 1.  Ultrasound-guided micropuncture access of the right common femoral artery in retrograde fashion 2.  Aortogram 3.  Second-order cannulation, left lower extremity  angiogram 4.  Third cannulation, left lower extremity angiogram 5.  Moderate sedation time 12 minutes, contrast volume 45 mL Indications: Patient is a 63 year old male with known left lower extremity critical and ischemia with tissue loss.  He underwent tibial intervention a month ago, but has had poor wound healing at the toe amputation site.  He presents today for repeat angiography in an effort to improve distal perfusion for wound healing. Findings: Sluggish flow indicative of heart failure Aortogram: Widely patent infrarenal aorta with no flow-limiting stenosis in the aortoiliac segments bilaterally On the left: Widely patent common femoral artery, profunda, superficial femoral artery, popliteal artery. Severe calcific disease of the tibial vessels with two-vessel runoff, anterior tibial artery, posterior tibial artery.  Severe small vessel disease in the foot.  Procedure:  The patient was identified in the holding area and taken to room 8.  The patient was then placed supine on the table and prepped and draped in the usual sterile fashion.  A time out was called.  Ultrasound was used to evaluate the right common femoral artery.  It was patent .  A digital ultrasound image was acquired.  A micropuncture needle was used to access the right common femoral artery under ultrasound guidance.  An 018 wire was advanced without resistance and a micropuncture sheath was placed.  The 018 wire was removed and a benson wire was placed.  The micropuncture sheath was  exchanged for a 5 french sheath.  An omniflush catheter was advanced over the wire to the level of L-1.  An abdominal angiogram was obtained.  Next, using the omniflush catheter and a benson wire, the aortic bifurcation was crossed and the catheter was placed into theleft external iliac artery and left runoff was obtained.  I then moved the catheter into the superficial femoral artery for lower extremity angiography due to issues with contrast resolution due to  severe heart failure.  Impression: Severe heart failure, severe small vessel disease.  Prior occluded anterior tibial artery is patent.  Patient has been maximally revascularized.  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 Fonda FORBES Rim MD [facility] [place] Office: [phone]   VAS US  ABI WITH/WO TBI Result Date: 03/19/2024  LOWER EXTREMITY DOPPLER STUDY Patient Name:  DASHON MCINTIRE  Date of Exam:   03/19/2024 Medical Rec #: [id number]        Accession #:    [id number] Date of Birth: 12/07/1960        Patient Gender: M Patient Age:   49 years Exam Location:  [facility] Procedure:      VAS US  ABI WITH/WO TBI Referring Phys: [facility] --------------------------------------------------------------------------------  Indications: non-healing amputation site (2nd and 3rd toes of LLE) High Risk Factors: Hypertension, hyperlipidemia, Diabetes, no history of                    smoking. Other Factors: Left 2nd and 3rd toe amputation on 02/19/2024, ESRD (HD).  Vascular Interventions: Left anterior tibial artery laser atherectomy, shockwave                         lithotripsy, and balloon angioplasty on 01/30/2024. Performing Technologist: Leigh Rom RVT, RDMS  Examination Guidelines: A complete evaluation includes at minimum, Doppler waveform signals and systolic blood pressure reading at the level of bilateral brachial, anterior tibial, and posterior tibial arteries, when vessel segments are accessible. Bilateral testing is considered an integral part of a complete examination. Photoelectric Plethysmograph (PPG) waveforms and toe systolic pressure readings are included as required and additional duplex testing as needed. Limited examinations for reoccurring indications may be performed as noted.  ABI Findings:  +---------+------------------+-----+-------------------+----------------+ Right    Rt Pressure (mmHg)IndexWaveform           Comment          +---------+------------------+-----+-------------------+----------------+ PTA                             dampened monophasicnon compressible +---------+------------------+-----+-------------------+----------------+ DP                              monophasic         non compressible +---------+------------------+-----+-------------------+----------------+ Great Toe0                 0.00 Absent                              +---------+------------------+-----+-------------------+----------------+ +---------+------------------+-----+----------+----------------+ Left     Lt Pressure (mmHg)IndexWaveform  Comment          +---------+------------------+-----+----------+----------------+ Brachial 165  biphasic                   +---------+------------------+-----+----------+----------------+ PTA                             monophasicnon compressible +---------+------------------+-----+----------+----------------+ DP                              monophasicnon compressible +---------+------------------+-----+----------+----------------+ Great Toe0                 0.00 Absent                     +---------+------------------+-----+----------+----------------+ +-------+-----------+-----------+------------+------------+ ABI/TBIToday's ABIToday's TBIPrevious ABIPrevious TBI +-------+-----------+-----------+------------+------------+ Right  [place]         absent     [place]          absent       +-------+-----------+-----------+------------+------------+ Left   [place]         absent     [place]          absent       +-------+-----------+-----------+------------+------------+  Bilateral ABIs and TBIs appear essentially unchanged.  Summary: Right: Resting right ankle-brachial index indicates noncompressible right lower  extremity arteries. The right toe-brachial index is absent.  Left: Resting left ankle-brachial index indicates noncompressible left lower extremity arteries. The left toe-brachial index is absent.  *See table(s) above for measurements and observations.  Electronically signed by Penne Colorado MD on 03/19/2024 at 6:35:59 PM.    Final    VAS US  LOWER EXTREMITY ARTERIAL DUPLEX Result Date: 03/19/2024 LOWER EXTREMITY ARTERIAL DUPLEX STUDY Patient Name:  JAYMEN FETCH  Date of Exam:   03/19/2024 Medical Rec #: [id number]        Accession #:    [id number] Date of Birth: December 31, 1960        Patient Gender: M Patient Age:   15 years Exam Location:  [facility] Procedure:      VAS US  LOWER EXTREMITY ARTERIAL DUPLEX Referring Phys: [facility] --------------------------------------------------------------------------------  Indications: Non healing amputation site (LLE). High Risk Factors: Hypertension, hyperlipidemia, Diabetes, no history of                    smoking. Other Factors: Left 2nd and 3rd toe amputation on 02/19/2024, ESRD (HD).  Vascular Interventions: Left anterior tibial artery laser atherectomy, shockwave                         lithotripsy, and balloon angioplasty on 01/30/2024. Current ABI:            non compressible ABI absent TBI, bilaterally Comparison Study: No previous Performing Technologist: Jody Hill RVT, RDMS  Examination Guidelines: A complete evaluation includes B-mode imaging, spectral Doppler, color Doppler, and power Doppler as needed of all accessible portions of each vessel. Bilateral testing is considered an integral part of a complete examination. Limited examinations for reoccurring indications may be performed as noted.   +----------+--------+-----+--------+----------+--------+ LEFT      PSV cm/sRatioStenosisWaveform  Comments +----------+--------+-----+--------+----------+--------+ CFA Prox  61                   biphasic            +----------+--------+-----+--------+----------+--------+ CFA Distal52                   triphasic          +----------+--------+-----+--------+----------+--------+  DFA       44                   biphasic           +----------+--------+-----+--------+----------+--------+ SFA Prox  57                   biphasic           +----------+--------+-----+--------+----------+--------+ SFA Mid   48                   biphasic           +----------+--------+-----+--------+----------+--------+ SFA Distal42                   biphasic           +----------+--------+-----+--------+----------+--------+ POP Prox  33                   biphasic           +----------+--------+-----+--------+----------+--------+ POP Distal35                   biphasic           +----------+--------+-----+--------+----------+--------+ TP Trunk  56                   biphasic           +----------+--------+-----+--------+----------+--------+ ATA Prox  49                   biphasic           +----------+--------+-----+--------+----------+--------+ ATA Mid   101                  biphasic           +----------+--------+-----+--------+----------+--------+ ATA Distal27                   biphasic           +----------+--------+-----+--------+----------+--------+ PTA Prox  22                   biphasic           +----------+--------+-----+--------+----------+--------+ PTA Mid   44                   biphasic           +----------+--------+-----+--------+----------+--------+ PTA Distal27                   monophasic         +----------+--------+-----+--------+----------+--------+ PERO Prox 18                   biphasic           +----------+--------+-----+--------+----------+--------+ PERO Mid  36                   biphasic           +----------+--------+-----+--------+----------+--------+ DP        32                   monophasic          +----------+--------+-----+--------+----------+--------+  Summary: Left: Arterial wall calcifications throughout lower extremity. No evidence of significant stenosis seen on this exam.  See table(s) above for measurements and observations. Electronically signed by Penne Colorado MD on 03/19/2024 at 6:35:20 PM.    Final     Labs: BMET Recent Labs  Lab 03/17/24 1701 03/19/24 1309 03/20/24 0022  03/21/24 0300  NA 132* 137 136 132*  K 4.3 3.6 3.8 3.2*  CL 95* 97* 100 94*  CO2 23 25 26 25   GLUCOSE 139* 117* 124* 147*  BUN 18 12 16 9   CREATININE 5.94* 4.43* 5.18* 3.04*  CALCIUM  10.1 8.9 8.6* 8.1*  PHOS  --   --  2.9 1.7*   CBC Recent Labs  Lab 03/17/24 1701 03/19/24 1309 03/20/24 0022  WBC 11.5* 9.5 8.0  NEUTROABS 9.4* 7.3  --   HGB 11.1* 10.2* 9.8*  HCT 34.8* 33.1* 30.9*  MCV 97.8 99.7 96.9  PLT 145* 148* 143*    Medications:     amitriptyline   10 mg Oral QHS   amLODipine   10 mg Oral q morning   [START ON 03/22/2024] atorvastatin   40 mg Oral Daily   calcium  acetate  1,334 mg Oral TID WC   Chlorhexidine  Gluconate Cloth  6 each Topical Q0600   gabapentin   100 mg Oral QHS   guaiFENesin   600 mg Oral BID   heparin   5,000 Units Subcutaneous Q8H   hydrALAZINE   10 mg Oral Q8H   levothyroxine   50 mcg Oral Q0600   metoprolol  tartrate  50 mg Oral BID   mupirocin  ointment  1 Application Nasal BID   pantoprazole   40 mg Oral Daily   sodium chloride  flush  3 mL Intravenous Q12H   sodium chloride  flush  3 mL Intravenous Q12H    Almarie Bonine, MD 03/21/2024, 11:16 AM

## 2024-03-21 NOTE — Progress Notes (Signed)
 Unwitnessed fall; no apparent injuries; MP notified and patient was assessed;   03/21/24 0823  What Happened  Was fall witnessed? No  Was patient injured? No  Patient found on floor  Found by Staff-comment  Stated prior activity other (comment) (sitting on edge of bed)  Provider Notification  Provider Name/Title Leotis Bogus, MD  Date Provider Notified 03/21/24  Time Provider Notified [phone]  Method of Notification Page  Notification Reason Fall  Provider response En route  Date of Provider Response 03/21/24  Time of Provider Response [phone]  Follow Up  Family notified Yes - comment  Time family notified 0831  Additional tests No  Simple treatment Other (comment) (bed alarm set; no injuries)  Progress note created (see row info) Yes

## 2024-03-21 NOTE — Progress Notes (Signed)
 PODIATRY PROGRESS NOTE Patient Name: [name] [name]  DOB 06-25-1960 DOA 03/19/2024  Hospital Day: 3  Assessment:  63 y.o. male with PMHx significant for  hypertension, hyperlipidemia, diabetes mellitus type 2, ESRD on HD, hypothyroidism   with left foot 2nd and 3rd toe amp site dehiscence and early acute osteomyelitis and necrosis of 2nd and 3rd met head, also with suspected PVD   WBC 8 CRP 15.7, ESR 74 MRI R foot: Full thickness ulceration with exposed bone 2nd met head  Plan:  - N.p.o. past midnight for left foot transmetatarsal amputation tomorrow morning pending patient willingness to proceed.  We had a discussion regarding the need for this he says he will think about it does not want to talk about it currently and will talk with his mother about it.  If he does not proceed with transmet amputation surgery then recommendation would be for palliative wound care Betadine wet-to-dry at the amputation site. - Appreciate vascular surgery status post angiogram left lower extremity today.  Discussed with Dr. Sheree, high risk for nonhealing even at TMA, level may ultimately require proximal amp if fails to heal.  - Continue IV abx broad spectrum pending further culture data - Anticoagulation: Ok to continue per vascular recs - Wound care: betadine gauze dressing pre op prn - WB status: NWB in post op shoe - Will continue to follow        Marolyn JULIANNA Honour, DPM [facility]    Subjective:  Discussed again with the patient this morning regarding recommendations for left foot transmetatarsal amputation.  He states he is not sure he wants to proceed at this time needs to talk with his mother.  I discussed that he will be high risk for worsening infection and loss of the entire foot if he does not proceed with surgery.  He is aware of this.  He is also aware of the risk of more proximal amputation if he fails to heal with a transmetatarsal amputation which is a definite concern  given his arterial disease in the foot.  Objective:   Vitals:   03/21/24 0143 03/21/24 0448  BP: (!) 157/78 (!) 147/63  Pulse: 79 60  Resp: (!) 24 18  Temp: 98.3 F (36.8 C) 98.4 F (36.9 C)  SpO2: 100% 98%       Latest Ref Rng & Units 03/20/2024   12:22 AM 03/19/2024    1:09 PM 03/17/2024    5:01 PM  CBC  WBC 4.0 - 10.5 K/uL 8.0  9.5  11.5   Hemoglobin 13.0 - 17.0 g/dL 9.8  89.7  88.8   Hematocrit 39.0 - 52.0 % 30.9  33.1  34.8   Platelets 150 - 400 K/uL 143  148  145        Latest Ref Rng & Units 03/21/2024    3:00 AM 03/20/2024   12:22 AM 03/19/2024    1:09 PM  BMP  Glucose 70 - 99 mg/dL 852  875  882   BUN 8 - 23 mg/dL 9  16  12    Creatinine 0.61 - 1.24 mg/dL 6.95  4.81  5.56   Sodium 135 - 145 mmol/L 132  136  137   Potassium 3.5 - 5.1 mmol/L 3.2  3.8  3.6   Chloride 98 - 111 mmol/L 94  100  97   CO2 22 - 32 mmol/L 25  26  25    Calcium  8.9 - 10.3 mg/dL 8.1  8.6  8.9  General: AAOx3, NAD  Lower Extremity Exam DP weakly palpable left foot, pt non palpable   Complete full thickness dehiscence of 2nd and 3rd toe amputation on left foot with exposed 2nd and 3rd met head with surrunding erythema, necrosis noted of the distal met heads that are seen.  Mild seropurulent drainage is seen today.    Sensation diminished to left foot      Radiology:  Results reviewed. See assessment for pertinent imaging results

## 2024-03-21 NOTE — Progress Notes (Addendum)
  Progress Note    03/21/2024 7:58 AM 1 Day Post-Op  Subjective:  no complaints   Vitals:   03/21/24 0143 03/21/24 0448  BP: (!) 157/78 (!) 147/63  Pulse: 79 60  Resp: (!) 24 18  Temp: 98.3 F (36.8 C) 98.4 F (36.9 C)  SpO2: 100% 98%   Physical Exam: Lungs:  non labored Incisions:  R groin cath site without hematoma Ext: dressing left in place L foot Neurologic: A&O  CBC    Component Value Date/Time   WBC 8.0 03/20/2024 0022   RBC 3.19 (L) 03/20/2024 0022   HGB 9.8 (L) 03/20/2024 0022   HCT 30.9 (L) 03/20/2024 0022   PLT 143 (L) 03/20/2024 0022   MCV 96.9 03/20/2024 0022   MCH 30.7 03/20/2024 0022   MCHC 31.7 03/20/2024 0022   RDW 14.5 03/20/2024 0022   LYMPHSABS 1.4 03/19/2024 1309   MONOABS 0.6 03/19/2024 1309   EOSABS 0.1 03/19/2024 1309   BASOSABS 0.1 03/19/2024 1309    BMET    Component Value Date/Time   NA 132 (L) 03/21/2024 0300   K 3.2 (L) 03/21/2024 0300   CL 94 (L) 03/21/2024 0300   CO2 25 03/21/2024 0300   GLUCOSE 147 (H) 03/21/2024 0300   BUN 9 03/21/2024 0300   CREATININE 3.04 (H) 03/21/2024 0300   CALCIUM  8.1 (L) 03/21/2024 0300   GFRNONAA 22 (L) 03/21/2024 0300   GFRAA 48 (L) 01/31/2020 1216    INR    Component Value Date/Time   INR 1.1 05/19/2023 1421     Intake/Output Summary (Last 24 hours) at 03/21/2024 0758 Last data filed at 03/21/2024 0520 Gross per 24 hour  Intake 780 ml  Output 1900 ml  Net -1120 ml     Assessment/Plan:  63 y.o. male is s/p diagnostic LLE angiogram 1 Day Post-Op   POD 1 after angiogram demonstrating severe small vessel disease.  He is considered optimized from a vascular standpoint.  Based on angiogram, recommendation is for L BKA however he is not willing to proceed.  Vascular will be available as needed.   Donnice Sender, PA-C [facility] [phone] 03/21/2024 7:58 AM   I have independently interviewed and examined patient and agree with PA assessment and plan above.   Ongoing discussions with podiatry for possible additional options in the foot which is certainly reasonable but he does have disadvantaged flow into the foot with diseased although patent 3 vessels to the ankle level.  Very high risk for left below-knee amputation this was again discussed with the patient.  Paulanthony Gleaves C. Sheree, MD [facility] [place] Office: [phone] Pager: [phone]

## 2024-03-21 NOTE — Plan of Care (Signed)
  Problem: Nutrition: Goal: Adequate nutrition will be maintained Outcome: Progressing   Problem: Elimination: Goal: Will not experience complications related to bowel motility Outcome: Progressing   Problem: Pain Managment: Goal: General experience of comfort will improve and/or be controlled Outcome: Progressing

## 2024-03-22 ENCOUNTER — Encounter (HOSPITAL_COMMUNITY)
Admission: EM | Disposition: A | Discharge status: Home / Self Care | Payer: Self-pay | Source: Home / Self Care | Attending: Family Medicine

## 2024-03-22 DIAGNOSIS — T8149XA Infection following a procedure, other surgical site, initial encounter: Secondary | ICD-10-CM | POA: Diagnosis not present

## 2024-03-22 LAB — RENAL FUNCTION PANEL
Albumin: 2.3 g/dL — ABNORMAL LOW (ref 3.5–5.0)
Anion gap: 18 — ABNORMAL HIGH (ref 5–15)
BUN: 16 mg/dL (ref 8–23)
CO2: 22 mmol/L (ref 22–32)
Calcium: 8 mg/dL — ABNORMAL LOW (ref 8.9–10.3)
Chloride: 92 mmol/L — ABNORMAL LOW (ref 98–111)
Creatinine, Ser: 5.03 mg/dL — ABNORMAL HIGH (ref 0.61–1.24)
GFR, Estimated: 12 mL/min — ABNORMAL LOW (ref >60)
Glucose, Bld: 110 mg/dL — ABNORMAL HIGH (ref 70–99)
Phosphorus: 4.5 mg/dL (ref 2.5–4.6)
Potassium: 4 mmol/L (ref 3.5–5.1)
Sodium: 132 mmol/L — ABNORMAL LOW (ref 135–145)

## 2024-03-22 LAB — HEPATITIS B SURFACE ANTIGEN: Hepatitis B Surface Ag: NONREACTIVE

## 2024-03-22 LAB — BASIC METABOLIC PANEL WITH GFR
Anion gap: 15 (ref 5–15)
BUN: 17 mg/dL (ref 8–23)
CO2: 24 mmol/L (ref 22–32)
Calcium: 8 mg/dL — ABNORMAL LOW (ref 8.9–10.3)
Chloride: 92 mmol/L — ABNORMAL LOW (ref 98–111)
Creatinine, Ser: 5.15 mg/dL — ABNORMAL HIGH (ref 0.61–1.24)
GFR, Estimated: 12 mL/min — ABNORMAL LOW (ref >60)
Glucose, Bld: 110 mg/dL — ABNORMAL HIGH (ref 70–99)
Potassium: 4 mmol/L (ref 3.5–5.1)
Sodium: 131 mmol/L — ABNORMAL LOW (ref 135–145)

## 2024-03-22 LAB — CBC
HCT: 28.6 % — ABNORMAL LOW (ref 39.0–52.0)
Hemoglobin: 9.5 g/dL — ABNORMAL LOW (ref 13.0–17.0)
MCH: 31 pg (ref 26.0–34.0)
MCHC: 33.2 g/dL (ref 30.0–36.0)
MCV: 93.5 fL (ref 80.0–100.0)
Platelets: 139 K/uL — ABNORMAL LOW (ref 150–400)
RBC: 3.06 MIL/uL — ABNORMAL LOW (ref 4.22–5.81)
RDW: 14.1 % (ref 11.5–15.5)
WBC: 7.5 K/uL (ref 4.0–10.5)
nRBC: 0 % (ref 0.0–0.2)

## 2024-03-22 LAB — MAGNESIUM: Magnesium: 1.7 mg/dL (ref 1.7–2.4)

## 2024-03-22 LAB — PHOSPHORUS: Phosphorus: 4.5 mg/dL (ref 2.5–4.6)

## 2024-03-22 SURGERY — AMPUTATION, FOOT, TRANSMETATARSAL
Anesthesia: Choice | Site: Toe | Laterality: Left

## 2024-03-22 MED ORDER — HEPARIN SODIUM (PORCINE) 1000 UNIT/ML IJ SOLN
3200.0000 [IU] | Freq: Once | INTRAMUSCULAR | Status: AC
  Administered 2024-03-22: 3200 [IU]

## 2024-03-22 MED ORDER — HEPARIN SODIUM (PORCINE) 1000 UNIT/ML IJ SOLN
3000.0000 [IU] | Freq: Once | INTRAMUSCULAR | Status: AC
Start: 2024-03-22 — End: 2024-03-26
  Administered 2024-03-26: 3000 [IU]

## 2024-03-22 MED ORDER — HEPARIN SODIUM (PORCINE) 1000 UNIT/ML IJ SOLN
INTRAMUSCULAR | Status: AC
  Filled 2024-03-22: qty 4

## 2024-03-22 MED ORDER — VANCOMYCIN HCL IN DEXTROSE 1-5 GM/200ML-% IV SOLN
INTRAVENOUS | Status: AC
  Filled 2024-03-22: qty 200

## 2024-03-22 MED ORDER — OXYCODONE-ACETAMINOPHEN 5-325 MG PO TABS
ORAL_TABLET | ORAL | Status: AC
  Filled 2024-03-22: qty 1

## 2024-03-22 NOTE — Progress Notes (Signed)
 Please see documentation re RN earlier this am re patient refusing to come down for planned surgery  I went to discuss with him, he says we haven't discussed surgical plan but I reminded him we have the past 2 days and both times he stated understanding for need for trans metatarsal amputation. He has asked to speak with his mother re surgical plan. After discussion and opportunity to ask questions he does state he does not want surgery today.  Surgery was cancelled for today. Unclear why patient continues to agree to procedure and then subsequently refuse. Need further discussion today with patient his mother and decision to be made if he wants trans metatarsal amputation. Maybe to OR Monday if agrees to proceed, weekend team to follow up.        Marolyn JULIANNA Honour, DPM [facility] / [facility]                   03/22/2024

## 2024-03-22 NOTE — Plan of Care (Signed)

## 2024-03-22 NOTE — Progress Notes (Addendum)
 Received call for OR, patient is still not ready to go. Patient called his mom, but she can't be here this early. Patient stated he cannot take the decision in instant. Made patient aware if he doesn't go now, he might not go today. Patient voiced understanding, pt stated he wants to discuss with the MD and his mom again. Called  OR  back and notified. Patient still kept NPO per OR. Plan of care continues.

## 2024-03-22 NOTE — Progress Notes (Signed)
Patient off floor to dialysis

## 2024-03-22 NOTE — Progress Notes (Signed)
 PROGRESS NOTE    [name] [name]  FMW:[id number] DOB: [birth year] DOA: 03/19/2024 PCP: Kip Righter, MD   Brief Narrative: This 63 y.o. male with medical history significant of hypertension, hyperlipidemia, diabetes mellitus type 2, ESRD on HD, hypothyroidism who presents with post-operative foot pain. 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 Patient noted that surgical site wound had opened up and he had redness present on the forefoot. He followed up with Dr. Alona today and due to to his symptoms and was advised to come to the hospital for further evaluation and need of IV antibiotics.  Patient was admitted for postoperative wound infection.  Podiatry is consulted,  vascular surgery is consulted.  Patient is s/p  angiogram followed by hemodialysis.  Assessment & Plan:   Principal Problem:   Postoperative wound infection Active Problems:   ESRD on dialysis (HCC)   PVD (peripheral vascular disease)   Anemia of chronic disease   Thrombocytopenia   Essential hypertension   Controlled type 2 diabetes mellitus without complication, without long-term current use of insulin  (HCC)   Hypothyroidism   Hyperlipidemia associated with type 2 diabetes mellitus (HCC)   GERD (gastroesophageal reflux disease)   Pyogenic inflammation of bone (HCC)   Wound dehiscence, surgical  Postoperative wound infection: Patient presented with increasing pain and erythema of his postoperative site. He recently underwent 2nd and 3rd left toe amputation due to gangrene on 02/14/2024 by Dr. Alona.   MRI noted full-thickness wound at the tip of the second metatarsal head. Patient had been started on doxycycline . - Blood cultures > No growth so far  - ESR and CRP elevated. - Continue empiric antibiotics vancomycin  and ampicillin. - Oxycodone  as needed for pain. - Podiatry consulted, recommended transmetatarsal amputation , tentatively scheduled today but patient  declined. -Appreciate vascular surgery, status post angiogram, patient high risk for nonhealing even a TMA level, may ultimately require more proximal amputation if fails to heal. -Patient is now is scheduled for TMA on Mon,   ESRD on HD: Continue hemodialysis as per nephrology.   Peripheral vascular disease: - Held aspirin  and Plavix  in case of need of procedure. - Patient s/p angiogram of left lower extremity 03/20/24.  Anemia of chronic disease: Chronic.Hemoglobin noted to be 10.2 which appears similar to prior. - No obvious visible bleeding.  Continue to monitor   Thrombocytopenia: Platelet count 143 which appears similar to most recent checks. - Continue to monitor   Essential hypertension: Blood pressure elevated at 155/75. - Continue metoprolol  and amlodipine .   Controlled diabetes mellitus type 2 without long-term use of insulin  Last hemoglobin A1c noted to be 5.1.  Patient is not on any diabetic medications at baseline. - Continue renal and carb modified diet - Continue gabapentin .   Hypothyroidism: - Continue levothyroxine .   Hyperlipidemia: - Continue atorvastatin  .   GERD: - Continue PPI.  Hypokalemia/hypophosphatemia: Replaced.  Continue to monitor  DVT prophylaxis: Heparin  sq Code Status: Full code Family Communication: No family at bed side. Disposition Plan:    Status is: Inpatient Remains inpatient appropriate because: Severity of illness.   Consultants:  Vascular surgery Podiatry Nephrology  Procedures:  Antimicrobials:  Anti-infectives (From admission, onward)    Start     Dose/Rate Route Frequency Ordered Stop   03/20/24 1800  vancomycin  (VANCOCIN ) IVPB 1000 mg/200 mL premix        1,000 mg 200 mL/hr over 60 Minutes Intravenous Every M-W-F (Hemodialysis) 03/20/24 1114  03/20/24 1700  vancomycin  (VANCOCIN ) IVPB 1000 mg/200 mL premix  Status:  Discontinued        1,000 mg 200 mL/hr over 60 Minutes Intravenous Every M-W-F  (Hemodialysis) 03/19/24 1642 03/20/24 0821   03/20/24 1700  Ampicillin-Sulbactam (UNASYN) 3 g in sodium chloride  0.9 % 100 mL IVPB  Status:  Discontinued        3 g 200 mL/hr over 30 Minutes Intravenous Every 24 hours 03/19/24 1642 03/20/24 0824   03/20/24 1700  Ampicillin-Sulbactam (UNASYN) 3 g in sodium chloride  0.9 % 100 mL IVPB        3 g 200 mL/hr over 30 Minutes Intravenous Every 12 hours 03/20/24 0824     03/20/24 0915  vancomycin  (VANCOREADY) IVPB 1500 mg/300 mL        1,500 mg 150 mL/hr over 120 Minutes Intravenous  Once 03/20/24 0821 03/20/24 1253   03/20/24 0821  vancomycin  variable dose per unstable renal function (pharmacist dosing)  Status:  Discontinued         Does not apply See admin instructions 03/20/24 0821 03/20/24 1114   03/19/24 1530  Ampicillin-Sulbactam (UNASYN) 3 g in sodium chloride  0.9 % 100 mL IVPB       Placed in And Linked Group   3 g 200 mL/hr over 30 Minutes Intravenous  Once 03/19/24 1516 03/20/24 0817   03/19/24 1530  vancomycin  (VANCOCIN ) IVPB 1000 mg/200 mL premix  Status:  Discontinued       Placed in And Linked Group   1,000 mg 200 mL/hr over 60 Minutes Intravenous  Once 03/19/24 1516 03/20/24 9178       Subjective: Patient was seen and examined at bedside.Overnight events noted. Patient is not sure about having amputation, states he is going to discuss with his mother. Patient was tentatively scheduled for TMA today but declined  Objective: Vitals:   03/21/24 2057 03/22/24 0352 03/22/24 0803 03/22/24 1144  BP: (!) 151/61 (!) 146/55 (!) 148/75 (!) 145/73  Pulse: 63 70 61 68  Resp: 18 16 16 17   Temp: 97.7 F (36.5 C) 97.9 F (36.6 C) 97.8 F (36.6 C) 98.3 F (36.8 C)  TempSrc: Oral Oral Oral Oral  SpO2: 100% 94% 98% 95%  Weight:      Height:        Intake/Output Summary (Last 24 hours) at 03/22/2024 1312 Last data filed at 03/22/2024 1200 Gross per 24 hour  Intake 560 ml  Output 50 ml  Net 510 ml   Filed Weights   03/20/24  0828  Weight: 82 kg    Examination:  General exam: Appears calm and comfortable, deconditioned, not in any acute distress. Respiratory system: CTA Bilaterally . Respiratory effort normal.  RR 15 Cardiovascular system: S1 & S2 heard, RRR. No JVD, murmurs, rubs, gallops or clicks.  Gastrointestinal system: Abdomen is non distended, soft and non tender. Normal bowel sounds heard. Central nervous system: Alert and oriented X 3. No focal neurological deficits. Extremities: Amputated 3rd and 4th toes, visible bone with significant erythema noted with pus. Skin: No rashes, lesions or ulcers Psychiatry: Judgement and insight appear normal. Mood & affect appropriate.     Data Reviewed: I have personally reviewed following labs and imaging studies  CBC: Recent Labs  Lab 03/17/24 1701 03/19/24 1309 03/20/24 0022 03/22/24 0317  WBC 11.5* 9.5 8.0 7.5  NEUTROABS 9.4* 7.3  --   --   HGB 11.1* 10.2* 9.8* 9.5*  HCT 34.8* 33.1* 30.9* 28.6*  MCV 97.8 99.7 96.9 93.5  PLT 145* 148* 143* 139*   Basic Metabolic Panel: Recent Labs  Lab 03/19/24 1309 03/20/24 0022 03/21/24 0300 03/22/24 0317 03/22/24 0318  NA 137 136 132* 131* 132*  K 3.6 3.8 3.2* 4.0 4.0  CL 97* 100 94* 92* 92*  CO2 25 26 25 24 22   GLUCOSE 117* 124* 147* 110* 110*  BUN 12 16 9 17 16   CREATININE 4.43* 5.18* 3.04* 5.15* 5.03*  CALCIUM  8.9 8.6* 8.1* 8.0* 8.0*  MG  --   --   --  1.7  --   PHOS  --  2.9 1.7* 4.5 4.5   GFR: Estimated Creatinine Clearance: 15.1 mL/min (A) (by C-G formula based on SCr of 5.03 mg/dL (H)). Liver Function Tests: Recent Labs  Lab 03/19/24 1309 03/20/24 0022 03/21/24 0300 03/22/24 0318  AST 14*  --   --   --   ALT 7  --   --   --   ALKPHOS 83  --   --   --   BILITOT 0.7  --   --   --   PROT 6.7  --   --   --   ALBUMIN  2.9* 2.6* 2.4* 2.3*   No results for input(s): LIPASE, AMYLASE in the last 168 hours. No results for input(s): AMMONIA in the last 168 hours. Coagulation  Profile: No results for input(s): INR, PROTIME in the last 168 hours. Cardiac Enzymes: No results for input(s): CKTOTAL, CKMB, CKMBINDEX, TROPONINI in the last 168 hours. BNP (last 3 results) No results for input(s): PROBNP in the last 8760 hours. HbA1C: No results for input(s): HGBA1C in the last 72 hours. CBG: Recent Labs  Lab 03/20/24 1555  GLUCAP 81   Lipid Profile: Recent Labs    03/21/24 0300  CHOL 95  HDL 34*  LDLCALC 40  TRIG 893  CHOLHDL 2.8   Thyroid  Function Tests: No results for input(s): TSH, T4TOTAL, FREET4, T3FREE, THYROIDAB in the last 72 hours. Anemia Panel: No results for input(s): VITAMINB12, FOLATE, FERRITIN, TIBC, IRON, RETICCTPCT in the last 72 hours. Sepsis Labs: Recent Labs  Lab 03/19/24 1327  LATICACIDVEN 1.4    Recent Results (from the past 240 hours)  Aerobic Culture w Gram Stain (superficial specimen)     Status: None   Collection Time: 03/17/24  7:59 PM   Specimen: Foot; Wound  Result Value Ref Range Status   Specimen Description   Final    FOOT Performed at [facility], [address] [place], [place] [address]    Special Requests   Final    NONE Performed at [facility], [address] [place], [place] [address]    Gram Stain   Final    RARE WBC PRESENT,BOTH PMN AND MONONUCLEAR MODERATE GRAM POSITIVE COCCI IN PAIRS IN CHAINS Performed at [facility], [address] [place], [place] [address]    Culture   Final    ABUNDANT METHICILLIN RESISTANT STAPHYLOCOCCUS AUREUS   Report Status 03/20/2024 FINAL  Final  Blood Cultures x 2 sites     Status: None (Preliminary result)   Collection Time: 03/19/24 12:43 PM   Specimen: BLOOD LEFT HAND  Result Value Ref Range Status   Specimen Description BLOOD LEFT HAND  Final   Special Requests   Final    BOTTLES DRAWN AEROBIC AND ANAEROBIC Blood Culture results may not be optimal due to an inadequate volume of blood received in culture bottles    Culture   Final    NO GROWTH  3 DAYS Performed at [facility], [address] [place], [place] [address]    Report Status PENDING  Incomplete  Blood Cultures x 2 sites     Status: None (Preliminary result)   Collection Time: 03/19/24  5:09 PM   Specimen: BLOOD  Result Value Ref Range Status   Specimen Description BLOOD LEFT ANTECUBITAL  Final   Special Requests   Final    BOTTLES DRAWN AEROBIC AND ANAEROBIC Blood Culture results may not be optimal due to an inadequate volume of blood received in culture bottles   Culture   Final    NO GROWTH 3 DAYS Performed at [facility], [address], [place], [place] [address]    Report Status PENDING  Incomplete  SARS Coronavirus 2 by RT PCR (hospital order, performed in [facility] hospital lab) *cepheid single result test* Anterior Nasal Swab     Status: None   Collection Time: 03/19/24  6:22 PM   Specimen: Anterior Nasal Swab  Result Value Ref Range Status   SARS Coronavirus 2 by RT PCR NEGATIVE NEGATIVE Final    Comment: Performed at [facility], [address], [place], [place] [address]  Respiratory (~20 pathogens) panel by PCR     Status: None   Collection Time: 03/19/24  6:22 PM   Specimen: Nasopharyngeal Swab; Respiratory  Result Value Ref Range Status   Adenovirus NOT DETECTED NOT DETECTED Final   Coronavirus 229E NOT DETECTED NOT DETECTED Final    Comment: (NOTE) The Coronavirus on the Respiratory Panel, DOES NOT test for the novel  Coronavirus (2019 nCoV)    Coronavirus HKU1 NOT DETECTED NOT DETECTED Final   Coronavirus NL63 NOT DETECTED NOT DETECTED Final   Coronavirus OC43 NOT DETECTED NOT DETECTED Final   Metapneumovirus NOT DETECTED NOT DETECTED Final   Rhinovirus / Enterovirus NOT DETECTED NOT DETECTED Final   Influenza A NOT DETECTED NOT DETECTED Final   Influenza B NOT DETECTED NOT DETECTED Final   Parainfluenza Virus 1 NOT DETECTED NOT DETECTED Final   Parainfluenza Virus 2 NOT DETECTED NOT  DETECTED Final   Parainfluenza Virus 3 NOT DETECTED NOT DETECTED Final   Parainfluenza Virus 4 NOT DETECTED NOT DETECTED Final   Respiratory Syncytial Virus NOT DETECTED NOT DETECTED Final   Bordetella pertussis NOT DETECTED NOT DETECTED Final   Bordetella Parapertussis NOT DETECTED NOT DETECTED Final   Chlamydophila pneumoniae NOT DETECTED NOT DETECTED Final   Mycoplasma pneumoniae NOT DETECTED NOT DETECTED Final    Comment: Performed at [facility], [address] [place], [place] [address]  MRSA Next Gen by PCR, Nasal     Status: Abnormal   Collection Time: 03/20/24  8:21 PM   Specimen: Nasal Mucosa; Nasal Swab  Result Value Ref Range Status   MRSA by PCR Next Gen DETECTED (A) NOT DETECTED Final    Comment: CRITICAL RESULT CALLED TO, READ BACK BY AND VERIFIED WITH: RN SOTERO BUNDE [phone] [id number] BY J RAZZAK, MT (NOTE) The GeneXpert MRSA Assay (FDA approved for NASAL specimens only), is one component of a comprehensive MRSA colonization surveillance program. It is not intended to diagnose MRSA infection nor to guide or monitor treatment for MRSA infections. Test performance is not FDA approved in patients less than 43 years old. Performed at [facility], [address] [place], [place] [address]     Radiology Studies: PERIPHERAL VASCULAR CATHETERIZATION Result Date: 03/20/2024 Patient name: [name] [name] MRN: [id number] DOB: May 09, 1961  Sex: male 03/20/2024 Pre-operative Diagnosis: Left lower extremity critical limb ischemia with tissue loss, nonhealing toe amputation site Post-operative diagnosis:  Same Surgeon:  Fonda FORBES Rim, MD Procedure Performed: 1.  Ultrasound-guided micropuncture access of the right common femoral artery in retrograde fashion 2.  Aortogram 3.  Second-order cannulation, left lower extremity angiogram 4.  Third cannulation, left lower extremity angiogram 5.  Moderate sedation time 12 minutes, contrast volume 45 mL Indications: Patient is a  63 year old male with known left lower extremity critical and ischemia with tissue loss.  He underwent tibial intervention a month ago, but has had poor wound healing at the toe amputation site.  He presents today for repeat angiography in an effort to improve distal perfusion for wound healing. Findings: Sluggish flow indicative of heart failure Aortogram: Widely patent infrarenal aorta with no flow-limiting stenosis in the aortoiliac segments bilaterally On the left: Widely patent common femoral artery, profunda, superficial femoral artery, popliteal artery. Severe calcific disease of the tibial vessels with two-vessel runoff, anterior tibial artery, posterior tibial artery.  Severe small vessel disease in the foot.  Procedure:  The patient was identified in the holding area and taken to room 8.  The patient was then placed supine on the table and prepped and draped in the usual sterile fashion.  A time out was called.  Ultrasound was used to evaluate the right common femoral artery.  It was patent .  A digital ultrasound image was acquired.  A micropuncture needle was used to access the right common femoral artery under ultrasound guidance.  An 018 wire was advanced without resistance and a micropuncture sheath was placed.  The 018 wire was removed and a benson wire was placed.  The micropuncture sheath was exchanged for a 5 french sheath.  An omniflush catheter was advanced over the wire to the level of L-1.  An abdominal angiogram was obtained.  Next, using the omniflush catheter and a benson wire, the aortic bifurcation was crossed and the catheter was placed into theleft external iliac artery and left runoff was obtained.  I then moved the catheter into the superficial femoral artery for lower extremity angiography due to issues with contrast resolution due to severe heart failure.  Impression: Severe heart failure, severe small vessel disease.  Prior occluded anterior tibial artery is patent.  Patient has  been maximally revascularized.  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 Fonda FORBES Rim MD [facility] Office: [phone]   Scheduled Meds:  amitriptyline   10 mg Oral QHS   amLODipine   10 mg Oral q morning   atorvastatin   40 mg Oral Daily   calcium  acetate  1,334 mg Oral TID WC   Chlorhexidine  Gluconate Cloth  6 each Topical Q0600   gabapentin   100 mg Oral QHS   guaiFENesin   600 mg Oral BID   heparin   5,000 Units Subcutaneous Q8H   heparin  sodium (porcine)  3,000 Units Intracatheter Once   hydrALAZINE   10 mg Oral Q8H   levothyroxine   50 mcg Oral Q0600   metoprolol  tartrate  50 mg Oral BID   mupirocin  ointment  1 Application Nasal BID   pantoprazole   40 mg Oral Daily   sodium chloride  flush  3 mL Intravenous Q12H   sodium chloride  flush  3 mL Intravenous Q12H   Continuous Infusions:  ampicillin-sulbactam (UNASYN) IV  Stopped (03/22/24 9441)   vancomycin  Stopped (03/21/24 0304)     LOS: 3 days    Time spent: 35 mins   Darcel Dawley, MD [facility]   If 7PM-7AM, please contact night-coverage

## 2024-03-22 NOTE — Progress Notes (Signed)
 Received patient in bed to unit.  Alert and oriented.  Informed consent signed and in chart.   TX duration:3.5  Patient tolerated well.  Transported back to the room  Alert, without acute distress.  Hand-off given to patient's nurse.   Access used: Right internal jugular HD cath Access issues: none  Total UF removed: 1.1L Medication(s) given: Oxycodone /acetmainophen,  Vancomycin    03/22/24 1852  Vitals  Temp (!) 97.4 F (36.3 C)  Temp Source Oral  BP (!) 146/103  Pulse Rate 64  ECG Heart Rate 64  Resp 17  Oxygen Therapy  SpO2 99 %  O2 Device Room Air  During Treatment Monitoring  Duration of HD Treatment -hour(s) 3.5 hour(s)  HD Safety Checks Performed Yes  Intra-Hemodialysis Comments Tx completed  Post Treatment  Dialyzer Clearance Clear  Liters Processed 84  Fluid Removed (mL) 1100 mL  Tolerated HD Treatment  (Patient complained about running too long)  Hemodialysis Catheter Right Internal jugular Double lumen Permanent (Tunneled)  Placement Date/Time: 01/10/24 1548   Placed prior to admission: No  Serial / Lot #: [id number]  Expiration Date: 08/10/28  Time Out: Correct patient;Correct site;Correct procedure  Maximum sterile barrier precautions: Hand hygiene;Large sterile sheet;S...  Site Condition No complications  Blue Lumen Status Flushed;Antimicrobial dead end cap;Heparin  locked  Red Lumen Status Flushed;Heparin  locked;Antimicrobial dead end cap  Purple Lumen Status N/A  Catheter fill solution Heparin  1000 units/ml  Catheter fill volume (Arterial) 1.6 cc  Catheter fill volume (Venous) 1.6  Dressing Type Transparent  Dressing Status Antimicrobial disc/dressing in place;Clean, Dry, Intact  Drainage Description None  Dressing Change Due 03/29/24  Post treatment catheter status Capped and Clamped     Camellia Brasil LPN [facility]

## 2024-03-22 NOTE — Progress Notes (Signed)
 Per floor RN patient is refusing surgery. Patient verbalized that he and his mom they want to talk with the doctor first, in patient's room. Dr. Malvin was notified. Dr. Malvin said that he already spoke with the patient and his mom and explained everything to them and that he will need to cancel the surgery for today if the patient is refusing to come in [facility] for prep. The floor nurse was notified and also the patient was notified.

## 2024-03-22 NOTE — Progress Notes (Signed)
 [facility] Progress Note   Assessment/ Plan:   Dialysis Orders:  MWF - [name] 4hrs, BFR 400, DFR Auto 1.5,  EDW 69kg, 3K/ 2.5Ca Heparin  3000 unit bolus and 500 unit mid-run Mircera 150 mcg q2wks - last 9/29   Last Labs: Hgb 9.8, K 3.8, Ca 8.6, P 2.9, Alb 2.6   Assessment/Plan: Non-healing L foot amputation site - On IV ABXs. VVS following. S/p angio 10/8, TMA was scheduled for Friday but pt declined.  Maybe Monday per podiatry.  High risk of BKA in future. ESRD - on HD MWF. Plan for HD per routine schedule.  HD today 10/10 Hypertension/volume  - Euvolemic on exam. BP stable. Anemia of CKD - Last ESA dose 9/29, not due yet Secondary Hyperparathyroidism -  Corr Ca ok and phos low. Hold binders for now. Nutrition - Renal diet with fluid restriction  Subjective:    Seen and examined.  Declined to go to the OR with podiatry today- see their note.  Mom at bedside today.   Objective:   BP (!) 145/73 (BP Location: Left Arm)   Pulse 68   Temp 98.3 F (36.8 C) (Oral)   Resp 17   Ht 5' 10 (1.778 m)   Wt 82 kg   SpO2 95%   BMI 25.94 kg/m   Intake/Output Summary (Last 24 hours) at 03/22/2024 1214 Last data filed at 03/22/2024 0622 Gross per 24 hour  Intake 560 ml  Output 50 ml  Net 510 ml   Weight change:   Physical Exam: Hzw:obpwh in bed CVS: RRR Resp: clear Abd: soft Ext: no LE edmea, L nonhealing toe amputation site  Imaging: PERIPHERAL VASCULAR CATHETERIZATION Result Date: 03/20/2024 Patient name: [name] [name] MRN: [id number] DOB: July 25, 1960 Sex: male 03/20/2024 Pre-operative Diagnosis: Left lower extremity critical limb ischemia with tissue loss, nonhealing toe amputation site Post-operative diagnosis:  Same Surgeon:  Fonda FORBES Rim, MD Procedure Performed: 1.  Ultrasound-guided micropuncture access of the right common femoral artery in retrograde fashion 2.  Aortogram 3.  Second-order cannulation, left lower extremity angiogram 4.   Third cannulation, left lower extremity angiogram 5.  Moderate sedation time 12 minutes, contrast volume 45 mL Indications: Patient is a 63 year old male with known left lower extremity critical and ischemia with tissue loss.  He underwent tibial intervention a month ago, but has had poor wound healing at the toe amputation site.  He presents today for repeat angiography in an effort to improve distal perfusion for wound healing. Findings: Sluggish flow indicative of heart failure Aortogram: Widely patent infrarenal aorta with no flow-limiting stenosis in the aortoiliac segments bilaterally On the left: Widely patent common femoral artery, profunda, superficial femoral artery, popliteal artery. Severe calcific disease of the tibial vessels with two-vessel runoff, anterior tibial artery, posterior tibial artery.  Severe small vessel disease in the foot.  Procedure:  The patient was identified in the holding area and taken to room 8.  The patient was then placed supine on the table and prepped and draped in the usual sterile fashion.  A time out was called.  Ultrasound was used to evaluate the right common femoral artery.  It was patent .  A digital ultrasound image was acquired.  A micropuncture needle was used to access the right common femoral artery under ultrasound guidance.  An 018 wire was advanced without resistance and a micropuncture sheath was placed.  The 018 wire was removed and a benson wire was placed.  The micropuncture sheath was exchanged  for a 5 french sheath.  An omniflush catheter was advanced over the wire to the level of L-1.  An abdominal angiogram was obtained.  Next, using the omniflush catheter and a benson wire, the aortic bifurcation was crossed and the catheter was placed into theleft external iliac artery and left runoff was obtained.  I then moved the catheter into the superficial femoral artery for lower extremity angiography due to issues with contrast resolution due to severe heart  failure.  Impression: Severe heart failure, severe small vessel disease.  Prior occluded anterior tibial artery is patent.  Patient has been maximally revascularized.  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 Fonda FORBES Rim MD [facility] Office: [phone]    Labs: BMET Recent Labs  Lab 03/17/24 1701 03/19/24 1309 03/20/24 0022 03/21/24 0300 03/22/24 0317 03/22/24 0318  NA 132* 137 136 132* 131* 132*  K 4.3 3.6 3.8 3.2* 4.0 4.0  CL 95* 97* 100 94* 92* 92*  CO2 23 25 26 25 24 22   GLUCOSE 139* 117* 124* 147* 110* 110*  BUN 18 12 16 9 17 16   CREATININE 5.94* 4.43* 5.18* 3.04* 5.15* 5.03*  CALCIUM  10.1 8.9 8.6* 8.1* 8.0* 8.0*  PHOS  --   --  2.9 1.7* 4.5 4.5   CBC Recent Labs  Lab 03/17/24 1701 03/19/24 1309 03/20/24 0022 03/22/24 0317  WBC 11.5* 9.5 8.0 7.5  NEUTROABS 9.4* 7.3  --   --   HGB 11.1* 10.2* 9.8* 9.5*  HCT 34.8* 33.1* 30.9* 28.6*  MCV 97.8 99.7 96.9 93.5  PLT 145* 148* 143* 139*    Medications:     amitriptyline   10 mg Oral QHS   amLODipine   10 mg Oral q morning   atorvastatin   40 mg Oral Daily   calcium  acetate  1,334 mg Oral TID WC   Chlorhexidine  Gluconate Cloth  6 each Topical Q0600   gabapentin   100 mg Oral QHS   guaiFENesin   600 mg Oral BID   heparin   5,000 Units Subcutaneous Q8H   heparin  sodium (porcine)  3,000 Units Intracatheter Once   hydrALAZINE   10 mg Oral Q8H   levothyroxine   50 mcg Oral Q0600   metoprolol  tartrate  50 mg Oral BID   mupirocin  ointment  1 Application Nasal BID   pantoprazole   40 mg Oral Daily   sodium chloride  flush  3 mL Intravenous Q12H   sodium chloride  flush  3 mL Intravenous Q12H    Almarie Bonine, MD 03/22/2024, 12:14 PM

## 2024-03-23 DIAGNOSIS — T8131XD Disruption of external operation (surgical) wound, not elsewhere classified, subsequent encounter: Secondary | ICD-10-CM | POA: Diagnosis not present

## 2024-03-23 DIAGNOSIS — I739 Peripheral vascular disease, unspecified: Secondary | ICD-10-CM | POA: Diagnosis not present

## 2024-03-23 DIAGNOSIS — T8149XA Infection following a procedure, other surgical site, initial encounter: Secondary | ICD-10-CM | POA: Diagnosis not present

## 2024-03-23 LAB — BASIC METABOLIC PANEL WITH GFR
Anion gap: 13 (ref 5–15)
BUN: 8 mg/dL (ref 8–23)
CO2: 26 mmol/L (ref 22–32)
Calcium: 8.2 mg/dL — ABNORMAL LOW (ref 8.9–10.3)
Chloride: 94 mmol/L — ABNORMAL LOW (ref 98–111)
Creatinine, Ser: 3.33 mg/dL — ABNORMAL HIGH (ref 0.61–1.24)
GFR, Estimated: 20 mL/min — ABNORMAL LOW (ref >60)
Glucose, Bld: 107 mg/dL — ABNORMAL HIGH (ref 70–99)
Potassium: 3.6 mmol/L (ref 3.5–5.1)
Sodium: 133 mmol/L — ABNORMAL LOW (ref 135–145)

## 2024-03-23 LAB — CBC
HCT: 29.2 % — ABNORMAL LOW (ref 39.0–52.0)
Hemoglobin: 9.4 g/dL — ABNORMAL LOW (ref 13.0–17.0)
MCH: 30 pg (ref 26.0–34.0)
MCHC: 32.2 g/dL (ref 30.0–36.0)
MCV: 93.3 fL (ref 80.0–100.0)
Platelets: 152 K/uL (ref 150–400)
RBC: 3.13 MIL/uL — ABNORMAL LOW (ref 4.22–5.81)
RDW: 14.1 % (ref 11.5–15.5)
WBC: 7.3 K/uL (ref 4.0–10.5)
nRBC: 0 % (ref 0.0–0.2)

## 2024-03-23 LAB — MAGNESIUM: Magnesium: 1.7 mg/dL (ref 1.7–2.4)

## 2024-03-23 LAB — GLUCOSE, CAPILLARY
Glucose-Capillary: 135 mg/dL — ABNORMAL HIGH (ref 70–99)
Glucose-Capillary: 127 mg/dL — ABNORMAL HIGH (ref 70–99)

## 2024-03-23 LAB — VANCOMYCIN, RANDOM: Vancomycin Rm: 35 ug/mL

## 2024-03-23 LAB — PHOSPHORUS: Phosphorus: 2.7 mg/dL (ref 2.5–4.6)

## 2024-03-23 MED ORDER — VANCOMYCIN HCL IN DEXTROSE 1-5 GM/200ML-% IV SOLN: 1000.0000 mg | INTRAVENOUS | Status: DC

## 2024-03-23 NOTE — Progress Notes (Signed)
 PROGRESS NOTE    [name] [name]  FMW:[id number] DOB: 03/08/1961 DOA: 03/19/2024 PCP: Kip Righter, MD   Brief Narrative: This 63 y.o. male with medical history significant of hypertension, hyperlipidemia, diabetes mellitus type 2, ESRD on HD, hypothyroidism who presents with post-operative foot pain. 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 Patient noted that surgical site wound had opened up and he had redness present on the forefoot. He followed up with Dr. Alona today and due to to his symptoms and was advised to come to the hospital for further evaluation and need of IV antibiotics.  Patient was admitted for postoperative wound infection.  Podiatry is consulted,  vascular surgery is consulted.  Patient is s/p  angiogram followed by hemodialysis.  Assessment & Plan:   Principal Problem:   Postoperative wound infection Active Problems:   ESRD on dialysis (HCC)   PVD (peripheral vascular disease)   Anemia of chronic disease   Thrombocytopenia   Essential hypertension   Controlled type 2 diabetes mellitus without complication, without long-term current use of insulin  (HCC)   Hypothyroidism   Hyperlipidemia associated with type 2 diabetes mellitus (HCC)   GERD (gastroesophageal reflux disease)   Pyogenic inflammation of bone (HCC)   Wound dehiscence, surgical  Postoperative wound infection: Patient presented with increasing pain and erythema of his postoperative site. He recently underwent 2nd and 3rd left toe amputation due to gangrene on 02/14/2024 by Dr. Alona.   MRI noted full-thickness wound at the tip of the second metatarsal head. Patient had been started on doxycycline . - Blood cultures > No growth so far  - ESR and CRP elevated. - Continue empiric antibiotics vancomycin  and ampicillin. - Oxycodone  as needed for pain. - Podiatry consulted, recommended transmetatarsal amputation , tentatively scheduled 10/9 but patient  declined. -Appreciate vascular surgery, status post angiogram, patient high risk for nonhealing even a TMA level, may ultimately require more proximal amputation if fails to heal. -Patient is now is scheduled for TMA on Mon. Patient is agreeable.   ESRD on HD: Continue hemodialysis as per nephrology.   Peripheral vascular disease: - Held aspirin  and Plavix  in case of need of procedure. - Patient s/p angiogram of left lower extremity 03/20/24.  Anemia of chronic disease: Chronic.Hemoglobin noted to be 10.2 which appears similar to prior. - No obvious visible bleeding.  Continue to monitor   Thrombocytopenia: Platelet count 143 which appears similar to most recent checks. - Continue to monitor   Essential hypertension: Blood pressure elevated at 155/75. - Continue metoprolol  and amlodipine .   Controlled diabetes mellitus type 2 without long-term use of insulin  Last hemoglobin A1c noted to be 5.1.  Patient is not on any diabetic medications at baseline. - Continue renal and carb modified diet - Continue gabapentin .   Hypothyroidism: - Continue levothyroxine .   Hyperlipidemia: - Continue atorvastatin  .   GERD: - Continue PPI.  Hypokalemia/hypophosphatemia: Replaced.  Continue to monitor  DVT prophylaxis: Heparin  sq Code Status: Full code Family Communication: No family at bed side. Disposition Plan:    Status is: Inpatient Remains inpatient appropriate because: Severity of illness.   Consultants:  Vascular surgery Podiatry Nephrology  Procedures:  Antimicrobials:  Anti-infectives (From admission, onward)    Start     Dose/Rate Route Frequency Ordered Stop   03/20/24 1800  vancomycin  (VANCOCIN ) IVPB 1000 mg/200 mL premix        1,000 mg 200 mL/hr over 60 Minutes Intravenous Every M-W-F (Hemodialysis) 03/20/24 1114  03/20/24 1700  vancomycin  (VANCOCIN ) IVPB 1000 mg/200 mL premix  Status:  Discontinued        1,000 mg 200 mL/hr over 60 Minutes Intravenous  Every M-W-F (Hemodialysis) 03/19/24 1642 03/20/24 0821   03/20/24 1700  Ampicillin-Sulbactam (UNASYN) 3 g in sodium chloride  0.9 % 100 mL IVPB  Status:  Discontinued        3 g 200 mL/hr over 30 Minutes Intravenous Every 24 hours 03/19/24 1642 03/20/24 0824   03/20/24 1700  Ampicillin-Sulbactam (UNASYN) 3 g in sodium chloride  0.9 % 100 mL IVPB        3 g 200 mL/hr over 30 Minutes Intravenous Every 12 hours 03/20/24 0824     03/20/24 0915  vancomycin  (VANCOREADY) IVPB 1500 mg/300 mL        1,500 mg 150 mL/hr over 120 Minutes Intravenous  Once 03/20/24 0821 03/20/24 1253   03/20/24 0821  vancomycin  variable dose per unstable renal function (pharmacist dosing)  Status:  Discontinued         Does not apply See admin instructions 03/20/24 0821 03/20/24 1114   03/19/24 1530  Ampicillin-Sulbactam (UNASYN) 3 g in sodium chloride  0.9 % 100 mL IVPB       Placed in And Linked Group   3 g 200 mL/hr over 30 Minutes Intravenous  Once 03/19/24 1516 03/20/24 0817   03/19/24 1530  vancomycin  (VANCOCIN ) IVPB 1000 mg/200 mL premix  Status:  Discontinued       Placed in And Linked Group   1,000 mg 200 mL/hr over 60 Minutes Intravenous  Once 03/19/24 1516 03/20/24 9178       Subjective: Patient was seen and examined at bedside.Overnight events noted. Patient states he has discussed with his mother and agreeable to amputation on Monday. Patient is tentatively scheduled for TMA  on Monday.  Objective: Vitals:   03/22/24 2322 03/23/24 0318 03/23/24 0618 03/23/24 0728  BP: (!) 157/84   (!) 149/77  Pulse: 72   60  Resp: 20   20  Temp: 98.1 F (36.7 C) 97.9 F (36.6 C)  97.9 F (36.6 C)  TempSrc: Oral Oral  Oral  SpO2: 100%   100%  Weight:   68.2 kg   Height:        Intake/Output Summary (Last 24 hours) at 03/23/2024 1103 Last data filed at 03/22/2024 1852 Gross per 24 hour  Intake 240 ml  Output 1100 ml  Net -860 ml   Filed Weights   03/22/24 1511 03/22/24 1852 03/23/24 0618  Weight:  68 kg 67 kg 68.2 kg    Examination:  General exam: Appears calm and comfortable, deconditioned, not in any acute distress. Respiratory system: CTA Bilaterally . Respiratory effort normal.  RR 15 Cardiovascular system: S1 & S2 heard, RRR. No JVD, murmurs, rubs, gallops or clicks.  Gastrointestinal system: Abdomen is non distended, soft and non tender. Normal bowel sounds heard. Central nervous system: Alert and oriented X 3. No focal neurological deficits. Extremities: Amputated 3rd and 4th toes, visible bone with significant erythema noted with pus. Skin: No rashes, lesions or ulcers Psychiatry: Judgement and insight appear normal. Mood & affect appropriate.     Data Reviewed: I have personally reviewed following labs and imaging studies  CBC: Recent Labs  Lab 03/17/24 1701 03/19/24 1309 03/20/24 0022 03/22/24 0317 03/23/24 0328  WBC 11.5* 9.5 8.0 7.5 7.3  NEUTROABS 9.4* 7.3  --   --   --   HGB 11.1* 10.2* 9.8* 9.5* 9.4*  HCT 34.8*  33.1* 30.9* 28.6* 29.2*  MCV 97.8 99.7 96.9 93.5 93.3  PLT 145* 148* 143* 139* 152   Basic Metabolic Panel: Recent Labs  Lab 03/20/24 0022 03/21/24 0300 03/22/24 0317 03/22/24 0318 03/23/24 0328  NA 136 132* 131* 132* 133*  K 3.8 3.2* 4.0 4.0 3.6  CL 100 94* 92* 92* 94*  CO2 26 25 24 22 26   GLUCOSE 124* 147* 110* 110* 107*  BUN 16 9 17 16 8   CREATININE 5.18* 3.04* 5.15* 5.03* 3.33*  CALCIUM  8.6* 8.1* 8.0* 8.0* 8.2*  MG  --   --  1.7  --  1.7  PHOS 2.9 1.7* 4.5 4.5 2.7   GFR: Estimated Creatinine Clearance: 21.3 mL/min (A) (by C-G formula based on SCr of 3.33 mg/dL (H)). Liver Function Tests: Recent Labs  Lab 03/19/24 1309 03/20/24 0022 03/21/24 0300 03/22/24 0318  AST 14*  --   --   --   ALT 7  --   --   --   ALKPHOS 83  --   --   --   BILITOT 0.7  --   --   --   PROT 6.7  --   --   --   ALBUMIN  2.9* 2.6* 2.4* 2.3*   No results for input(s): LIPASE, AMYLASE in the last 168 hours. No results for input(s): AMMONIA in  the last 168 hours. Coagulation Profile: No results for input(s): INR, PROTIME in the last 168 hours. Cardiac Enzymes: No results for input(s): CKTOTAL, CKMB, CKMBINDEX, TROPONINI in the last 168 hours. BNP (last 3 results) No results for input(s): PROBNP in the last 8760 hours. HbA1C: No results for input(s): HGBA1C in the last 72 hours. CBG: Recent Labs  Lab 03/20/24 1555  GLUCAP 81   Lipid Profile: Recent Labs    03/21/24 0300  CHOL 95  HDL 34*  LDLCALC 40  TRIG 893  CHOLHDL 2.8   Thyroid  Function Tests: No results for input(s): TSH, T4TOTAL, FREET4, T3FREE, THYROIDAB in the last 72 hours. Anemia Panel: No results for input(s): VITAMINB12, FOLATE, FERRITIN, TIBC, IRON, RETICCTPCT in the last 72 hours. Sepsis Labs: Recent Labs  Lab 03/19/24 1327  LATICACIDVEN 1.4    Recent Results (from the past 240 hours)  Aerobic Culture w Gram Stain (superficial specimen)     Status: None   Collection Time: 03/17/24  7:59 PM   Specimen: Foot; Wound  Result Value Ref Range Status   Specimen Description   Final    FOOT Performed at [facility], [address] [place], [place] [address]    Special Requests   Final    NONE Performed at [facility], [address] [place], [place] [address]    Gram Stain   Final    RARE WBC PRESENT,BOTH PMN AND MONONUCLEAR MODERATE GRAM POSITIVE COCCI IN PAIRS IN CHAINS Performed at [facility], [address], [place], [place] [address]    Culture   Final    ABUNDANT METHICILLIN RESISTANT STAPHYLOCOCCUS AUREUS   Report Status 03/20/2024 FINAL  Final  Blood Cultures x 2 sites     Status: None (Preliminary result)   Collection Time: 03/19/24 12:43 PM   Specimen: BLOOD LEFT HAND  Result Value Ref Range Status   Specimen Description BLOOD LEFT HAND  Final   Special Requests   Final    BOTTLES DRAWN AEROBIC AND ANAEROBIC Blood Culture results may not be optimal due to an inadequate volume of  blood received in culture bottles  Culture   Final    NO GROWTH 4 DAYS Performed at [facility], [address] [place], [place] [address]    Report Status PENDING  Incomplete  Blood Cultures x 2 sites     Status: None (Preliminary result)   Collection Time: 03/19/24  5:09 PM   Specimen: BLOOD  Result Value Ref Range Status   Specimen Description BLOOD LEFT ANTECUBITAL  Final   Special Requests   Final    BOTTLES DRAWN AEROBIC AND ANAEROBIC Blood Culture results may not be optimal due to an inadequate volume of blood received in culture bottles   Culture   Final    NO GROWTH 4 DAYS Performed at [facility], [address], [place], [place] [address]    Report Status PENDING  Incomplete  SARS Coronavirus 2 by RT PCR (hospital order, performed in [facility] hospital lab) *cepheid single result test* Anterior Nasal Swab     Status: None   Collection Time: 03/19/24  6:22 PM   Specimen: Anterior Nasal Swab  Result Value Ref Range Status   SARS Coronavirus 2 by RT PCR NEGATIVE NEGATIVE Final    Comment: Performed at [facility], [address] [place], [place] [address]  Respiratory (~20 pathogens) panel by PCR     Status: None   Collection Time: 03/19/24  6:22 PM   Specimen: Nasopharyngeal Swab; Respiratory  Result Value Ref Range Status   Adenovirus NOT DETECTED NOT DETECTED Final   Coronavirus 229E NOT DETECTED NOT DETECTED Final    Comment: (NOTE) The Coronavirus on the Respiratory Panel, DOES NOT test for the novel  Coronavirus (2019 nCoV)    Coronavirus HKU1 NOT DETECTED NOT DETECTED Final   Coronavirus NL63 NOT DETECTED NOT DETECTED Final   Coronavirus OC43 NOT DETECTED NOT DETECTED Final   Metapneumovirus NOT DETECTED NOT DETECTED Final   Rhinovirus / Enterovirus NOT DETECTED NOT DETECTED Final   Influenza A NOT DETECTED NOT DETECTED Final   Influenza B NOT DETECTED NOT DETECTED Final   Parainfluenza Virus 1 NOT DETECTED NOT DETECTED Final    Parainfluenza Virus 2 NOT DETECTED NOT DETECTED Final   Parainfluenza Virus 3 NOT DETECTED NOT DETECTED Final   Parainfluenza Virus 4 NOT DETECTED NOT DETECTED Final   Respiratory Syncytial Virus NOT DETECTED NOT DETECTED Final   Bordetella pertussis NOT DETECTED NOT DETECTED Final   Bordetella Parapertussis NOT DETECTED NOT DETECTED Final   Chlamydophila pneumoniae NOT DETECTED NOT DETECTED Final   Mycoplasma pneumoniae NOT DETECTED NOT DETECTED Final    Comment: Performed at [facility], [address], [place], [place] [address]  MRSA Next Gen by PCR, Nasal     Status: Abnormal   Collection Time: 03/20/24  8:21 PM   Specimen: Nasal Mucosa; Nasal Swab  Result Value Ref Range Status   MRSA by PCR Next Gen DETECTED (A) NOT DETECTED Final    Comment: CRITICAL RESULT CALLED TO, READ BACK BY AND VERIFIED WITH: RN SOTERO BUNDE [phone] [id number] BY J RAZZAK, MT (NOTE) The GeneXpert MRSA Assay (FDA approved for NASAL specimens only), is one component of a comprehensive MRSA colonization surveillance program. It is not intended to diagnose MRSA infection nor to guide or monitor treatment for MRSA infections. Test performance is not FDA approved in patients less than 53 years old. Performed at [facility], [address], [place], [place] [address]     Radiology Studies: No results found.  Scheduled Meds:  amitriptyline   10 mg Oral QHS   amLODipine   10 mg Oral q morning   atorvastatin   40 mg Oral Daily   calcium  acetate  1,334 mg Oral TID WC   Chlorhexidine  Gluconate Cloth  6 each Topical Q0600   gabapentin   100 mg Oral QHS   guaiFENesin   600 mg Oral BID   heparin   5,000 Units Subcutaneous Q8H   heparin  sodium (porcine)  3,000 Units Intracatheter Once   hydrALAZINE   10 mg Oral Q8H   levothyroxine   50 mcg Oral Q0600   metoprolol  tartrate  50 mg Oral BID   mupirocin  ointment  1 Application Nasal BID   pantoprazole   40 mg Oral Daily   sodium chloride  flush  3 mL  Intravenous Q12H   sodium chloride  flush  3 mL Intravenous Q12H   Continuous Infusions:  ampicillin-sulbactam (UNASYN) IV 3 g (03/23/24 0539)   vancomycin  1,000 mg (03/22/24 1738)     LOS: 4 days    Time spent: 35 mins   Darcel Dawley, MD [facility]   If 7PM-7AM, please contact night-coverage

## 2024-03-23 NOTE — Progress Notes (Addendum)
 [facility] Progress Note   Assessment/ Plan:   Dialysis Orders:  MWF - [facility] 4hrs, BFR 400, DFR Auto 1.5,  EDW 69kg, 3K/ 2.5Ca Heparin  3000 unit bolus and 500 unit mid-run Mircera 150 mcg q2wks - last 9/29   Last Labs: Hgb 9.8, K 3.8, Ca 8.6, P 2.9, Alb 2.6   Assessment/Plan: Non-healing L foot amputation site - On IV ABXs. VVS following. S/p angio 10/8, TMA was scheduled for Friday but pt declined.  Pt is now agreeable for Monday.  High risk of BKA in future. ESRD - on HD MWF. Plan for HD per routine schedule.  Next HD 10/13 Hypertension/volume  - Euvolemic on exam. BP stable. Anemia of CKD - Last ESA dose 9/29, not due yet Secondary Hyperparathyroidism -  Corr Ca ok and phos low. Hold binders for now. Nutrition - Renal diet with fluid restriction  Subjective:    Seen and examined.  No issues, says he's tired today.   Objective:   BP (!) 143/84 (BP Location: Left Wrist)   Pulse 63   Temp 97.9 F (36.6 C) (Oral)   Resp 20   Ht 5' 10 (1.778 m)   Wt 68.2 kg   SpO2 100%   BMI 21.57 kg/m   Intake/Output Summary (Last 24 hours) at 03/23/2024 1209 Last data filed at 03/22/2024 1852 Gross per 24 hour  Intake --  Output 1100 ml  Net -1100 ml   Weight change:   Physical Exam: Hzw:obpwh in bed CVS: RRR Resp: clear Abd: soft Ext: no LE edmea, L nonhealing toe amputation site  Imaging: No results found.   Labs: BMET Recent Labs  Lab 03/17/24 1701 03/19/24 1309 03/20/24 0022 03/21/24 0300 03/22/24 0317 03/22/24 0318 03/23/24 0328  NA 132* 137 136 132* 131* 132* 133*  K 4.3 3.6 3.8 3.2* 4.0 4.0 3.6  CL 95* 97* 100 94* 92* 92* 94*  CO2 23 25 26 25 24 22 26   GLUCOSE 139* 117* 124* 147* 110* 110* 107*  BUN 18 12 16 9 17 16 8   CREATININE 5.94* 4.43* 5.18* 3.04* 5.15* 5.03* 3.33*  CALCIUM  10.1 8.9 8.6* 8.1* 8.0* 8.0* 8.2*  PHOS  --   --  2.9 1.7* 4.5 4.5 2.7   CBC Recent Labs  Lab 03/17/24 1701 03/19/24 1309  03/20/24 0022 03/22/24 0317 03/23/24 0328  WBC 11.5* 9.5 8.0 7.5 7.3  NEUTROABS 9.4* 7.3  --   --   --   HGB 11.1* 10.2* 9.8* 9.5* 9.4*  HCT 34.8* 33.1* 30.9* 28.6* 29.2*  MCV 97.8 99.7 96.9 93.5 93.3  PLT 145* 148* 143* 139* 152    Medications:     amitriptyline   10 mg Oral QHS   amLODipine   10 mg Oral q morning   atorvastatin   40 mg Oral Daily   calcium  acetate  1,334 mg Oral TID WC   Chlorhexidine  Gluconate Cloth  6 each Topical Q0600   gabapentin   100 mg Oral QHS   guaiFENesin   600 mg Oral BID   heparin   5,000 Units Subcutaneous Q8H   heparin  sodium (porcine)  3,000 Units Intracatheter Once   hydrALAZINE   10 mg Oral Q8H   levothyroxine   50 mcg Oral Q0600   metoprolol  tartrate  50 mg Oral BID   mupirocin  ointment  1 Application Nasal BID   pantoprazole   40 mg Oral Daily   sodium chloride  flush  3 mL Intravenous Q12H   sodium chloride  flush  3 mL Intravenous Q12H  Almarie Bonine, MD 03/23/2024, 12:09 PM

## 2024-03-23 NOTE — Progress Notes (Signed)
 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 Patient has undergone vascular intervention with limited potential to heal. Planned for TMA on Friday but patient had refused. Now planning on Monday. Patient relates he is good with plan for Monday. He does plan to talk to his mother though still.   Past Medical History:  Diagnosis Date   Anemia    Arthritis    Cardiomyopathy [facility])    July 2022   Diabetes mellitus [facility])    Dialysis patient    GERD (gastroesophageal reflux disease)    HTN (hypertension)    Hyperlipidemia    Hypothyroidism    Nephrotic syndrome    HD MWF   Obesity    Pulmonary hypertension (HCC)    July 2022 in setting of nephrotic syndrome   Secondary hyperparathyroidism of renal origin      Past Surgical History:  Procedure Laterality Date   ABDOMINAL AORTOGRAM W/LOWER EXTREMITY N/A 01/30/2024   Procedure: ABDOMINAL AORTOGRAM W/LOWER EXTREMITY;  Surgeon: Serene Gaile ORN, MD;  Location: [facility] INVASIVE CV LAB;  Service: Cardiovascular;  Laterality: N/A;   ABDOMINAL AORTOGRAM W/LOWER EXTREMITY N/A 03/20/2024   Procedure: ABDOMINAL AORTOGRAM W/LOWER EXTREMITY;  Surgeon: Lanis Fonda BRAVO, MD;  Location: [facility] INVASIVE CV LAB;  Service: Cardiovascular;  Laterality: N/A;   AMPUTATION TOE Left 02/14/2024   Procedure: AMPUTATION, TOE LEFT SECOND AND THIRD TOES;  Surgeon: Gershon Donnice SAUNDERS, DPM;  Location: [facility] OR;  Service: Orthopedics/Podiatry;  Laterality: Left;   AV FISTULA PLACEMENT Left 12/27/2022   Procedure: INSERTION OF LEFT ARM ARTERIOVENOUS (AV) GORE-TEX GRAFT;  Surgeon: Eliza Lonni RAMAN, MD;  Location: [facility] OR;  Service: Vascular;  Laterality: Left;   AV FISTULA PLACEMENT Right 08/17/2023   Procedure: INSERTION OF ARTERIOVENOUS (AV) GORE-TEX GRAFT RIGHT ARM USING 4-49mm GORETEX STRETCH GRAFT;  Surgeon: Magda Debby SAILOR, MD;  Location:  [facility] OR;  Service: Vascular;  Laterality: Right;   AVGG REMOVAL Left 03/15/2023   Procedure: REMOVAL OF LEFT UPPER ARM ARTERIOVENOUS GORETEX GRAFT (AVGG);  Surgeon: Magda Debby SAILOR, MD;  Location: [facility] OR;  Service: Vascular;  Laterality: Left;   BIOPSY  01/13/2023   Procedure: BIOPSY;  Surgeon: Cinderella Deatrice FALCON, MD;  Location: [facility] ENDO SUITE;  Service: Endoscopy;;   CATARACT EXTRACTION, BILATERAL  2018   COLONOSCOPY WITH PROPOFOL  N/A 01/13/2023   Procedure: COLONOSCOPY WITH PROPOFOL ;  Surgeon: Cinderella Deatrice FALCON, MD;  Location: [facility] ENDO SUITE;  Service: Endoscopy;  Laterality: N/A;   DIALYSIS/PERMA CATHETER INSERTION Right 01/10/2024   Procedure: DIALYSIS/PERMA CATHETER INSERTION;  Surgeon: Gretta Lonni PARAS, MD;  Location: [facility] PV LAB;  Service: Cardiovascular;  Laterality: Right;   INSERTION OF DIALYSIS CATHETER Right 12/27/2022   Procedure: INSERTION OF Right Internal Jugular  DIALYSIS CATHETER;  Surgeon: Eliza Lonni RAMAN, MD;  Location: [facility] OR;  Service: Vascular;  Laterality: Right;   INSERTION OF DIALYSIS CATHETER N/A 03/15/2023   Procedure: INSERTION OF TUNNELED DIALYSIS CATHETER;  Surgeon: Magda Debby SAILOR, MD;  Location: [facility] OR;  Service: Vascular;  Laterality: N/A;   INSERTION OF DIALYSIS CATHETER Right 03/25/2023   Procedure: INSERTION OF RIGHT TUNNELED DIALYSIS CATHETER;  Surgeon: Pearline Norman RAMAN, MD;  Location: [facility] OR;  Service: Vascular;  Laterality: Right;   LOWER EXTREMITY INTERVENTION  01/30/2024   Procedure: LOWER EXTREMITY INTERVENTION;  Surgeon: Serene Gaile ORN, MD;  Location: [facility] INVAS[facility] CV LAB;  Service:  Cardiovascular;;   PERIPHERAL INTRAVASCULAR LITHOTRIPSY  01/30/2024   Procedure: PERIPHERAL INTRAVASCULAR LITHOTRIPSY;  Surgeon: Serene Gaile ORN, MD;  Location: [facility] INVASIVE CV LAB;  Service: Cardiovascular;;   PERIPHERAL VASCULAR ATHERECTOMY  01/30/2024   Procedure: PERIPHERAL VASCULAR ATHERECTOMY;  Surgeon: Serene Gaile ORN, MD;  Location: [facility] INVAS[facility] CV LAB;  Service:  Cardiovascular;;   PERIPHERAL VASCULAR THROMBECTOMY Right 12/20/2023   Procedure: PERIPHERAL VASCULAR THROMBECTOMY;  Surgeon: Pearline Norman RAMAN, MD;  Location: [facility];  Service: Cardiovascular;  Laterality: Right;   TEE WITHOUT CARDIOVERSION N/A 03/20/2023   Procedure: TRANSESOPHAGEAL ECHOCARDIOGRAM;  Surgeon: Barbaraann Darryle Ned, MD;  Location: [facility] OR;  Service: Cardiovascular;  Laterality: N/A;   TONSILLECTOMY     VENOUS ANGIOPLASTY  12/20/2023   Procedure: VENOUS ANGIOPLASTY;  Surgeon: Pearline Norman RAMAN, MD;  Location: [facility];  Service: Cardiovascular;;   VENOUS STENT  12/20/2023   Procedure: VENOUS STENT;  Surgeon: Pearline Norman RAMAN, MD;  Location: [facility];  Service: Cardiovascular;;  outflow       Latest Ref Rng & Units 03/23/2024    3:28 AM 03/22/2024    3:17 AM 03/20/2024   12:22 AM  CBC  WBC 4.0 - 10.5 K/uL 7.3  7.5  8.0   Hemoglobin 13.0 - 17.0 g/dL 9.4  9.5  9.8   Hematocrit 39.0 - 52.0 % 29.2  28.6  30.9   Platelets 150 - 400 K/uL 152  139  143        Latest Ref Rng & Units 03/23/2024    3:28 AM 03/22/2024    3:18 AM 03/22/2024    3:17 AM  BMP  Glucose 70 - 99 mg/dL 892  889  889   BUN 8 - 23 mg/dL 8  16  17    Creatinine 0.61 - 1.24 mg/dL 6.66  4.96  4.84   Sodium 135 - 145 mmol/L 133  132  131   Potassium 3.5 - 5.1 mmol/L 3.6  4.0  4.0   Chloride 98 - 111 mmol/L 94  92  92   CO2 22 - 32 mmol/L 26  22  24    Calcium  8.9 - 10.3 mg/dL 8.2  8.0  8.0      Objective:   Vitals:   03/23/24 1129 03/23/24 1143  BP: (!) 149/77 (!) 143/84  Pulse: 60 63  Resp:  20  Temp:  97.9 F (36.6 C)  SpO2:  100%    General:AA&O x 3. Normal mood and affect   Vascular: DP and PT pulses 2/4 bilateral. Brisk capillary refill to all digits. Pedal hair present   Neruological. Epicritic sensation grossly intact.   Derm: Dressing intact and no strikethrough noted.   MSK: MMT 5/5 in dorsiflexion, plantar flexion, inversion and eversion. Normal joint ROM without pain or  crepitus.       Assessment & Plan:  Patient was evaluated and treated and all questions answered.  DX: Left second and third amp dehiscence with osteomyelitis and PVD  Wound care: Betadine , DSD prn   Antibiotics: Per primary  DME: [facility] in post op shoes.   Plan to continue with TMA on Monday per patient he is good with this plan. He has not discussed with his mother yet but still ok to proceed on Monday with TMA.    Asberry Failing, DPM  Accessible via secure chat for questions or concerns.

## 2024-03-24 DIAGNOSIS — T8149XA Infection following a procedure, other surgical site, initial encounter: Secondary | ICD-10-CM | POA: Diagnosis not present

## 2024-03-24 LAB — GLUCOSE, CAPILLARY: Glucose-Capillary: 94 mg/dL (ref 70–99)

## 2024-03-24 LAB — RENAL FUNCTION PANEL
Albumin: 2.4 g/dL — ABNORMAL LOW (ref 3.5–5.0)
Anion gap: 21 — ABNORMAL HIGH (ref 5–15)
BUN: 16 mg/dL (ref 8–23)
CO2: 22 mmol/L (ref 22–32)
Calcium: 8.5 mg/dL — ABNORMAL LOW (ref 8.9–10.3)
Chloride: 93 mmol/L — ABNORMAL LOW (ref 98–111)
Creatinine, Ser: 5.13 mg/dL — ABNORMAL HIGH (ref 0.61–1.24)
GFR, Estimated: 12 mL/min — ABNORMAL LOW (ref >60)
Glucose, Bld: 96 mg/dL (ref 70–99)
Phosphorus: 3.6 mg/dL (ref 2.5–4.6)
Potassium: 4 mmol/L (ref 3.5–5.1)
Sodium: 136 mmol/L (ref 135–145)

## 2024-03-24 LAB — CULTURE, BLOOD (ROUTINE X 2)
Culture: NO GROWTH
Culture: NO GROWTH

## 2024-03-24 NOTE — Progress Notes (Addendum)
 Mobility Specialist Progress Note;   03/24/24 1117  Mobility  Activity Pivoted/transferred to/from [facility]  Level of Assistance Minimal assist, patient does 75% or more  Assistive Device None  Distance Ambulated (ft) 3 ft  Activity Response Tolerated fair  Mobility Referral Yes  Mobility visit 1 Mobility  Mobility Specialist Start Time (ACUTE ONLY) 1117  Mobility Specialist Stop Time (ACUTE ONLY) 1122  Mobility Specialist Time Calculation (min) (ACUTE ONLY) 5 min   Assisted NT in transferring pt. Required MinA +2 to transfer pt from bed to [facility] w/o fault. VC for safety. Pt left on [facility] with all needs met. NT in room.   Lauraine Erm Mobility Specialist Please contact via SecureChat or [employer] [phone]

## 2024-03-24 NOTE — Progress Notes (Signed)
 [facility] Progress Note   Assessment/ Plan:   Dialysis Orders:  MWF - [facility] 4hrs, BFR 400, DFR Auto 1.5,  EDW 69kg, 3K/ 2.5Ca Heparin  3000 unit bolus and 500 unit mid-run Mircera 150 mcg q2wks - last 9/29   Last Labs: Hgb 9.8, K 3.8, Ca 8.6, P 2.9, Alb 2.6   Assessment/Plan: Non-healing L foot amputation site - On IV ABXs. VVS following. S/p angio 10/8, TMA was scheduled for Friday but pt declined.  High risk of BKA in future.  Discussed with pt the reasoning for surgery.  I discussed that not addressing the nonhealing wound leaves him at high risk for sepsis and decompensation-- I told him that I didn't recommend that he do nothing about it.  Explained to pt the general process of going down to OR.  Also discussed that podiatry has explained surgery to him as well ESRD - on HD MWF. Plan for HD per routine schedule.  Next HD 10/13 Hypertension/volume  - Euvolemic on exam. BP stable. Anemia of CKD - Last ESA dose 9/29, not due yet Secondary Hyperparathyroidism -  Corr Ca ok and phos low. Hold binders for now. Nutrition - Renal diet with fluid restriction  Subjective:    Seen and examined.  Says no one has told me anything about his amputation scheduled for tomorrow, says that he's not sure if he wants to go through with it.   Objective:   BP 139/73 (BP Location: Left Wrist)   Pulse 60   Temp 97.9 F (36.6 C) (Oral)   Resp 16   Ht 5' 10 (1.778 m)   Wt 68.2 kg   SpO2 96%   BMI 21.57 kg/m   Intake/Output Summary (Last 24 hours) at 03/24/2024 1358 Last data filed at 03/23/2024 1900 Gross per 24 hour  Intake 730.32 ml  Output --  Net 730.32 ml   Weight change:   Physical Exam: Hzw:obpwh in bed CVS: RRR Resp: clear Abd: soft Ext: no LE edmea, L nonhealing toe amputation site  Imaging: No results found.   Labs: BMET Recent Labs  Lab 03/19/24 1309 03/20/24 0022 03/21/24 0300 03/22/24 0317 03/22/24 0318 03/23/24 0328  03/24/24 0812  NA 137 136 132* 131* 132* 133* 136  K 3.6 3.8 3.2* 4.0 4.0 3.6 4.0  CL 97* 100 94* 92* 92* 94* 93*  CO2 25 26 25 24 22 26 22   GLUCOSE 117* 124* 147* 110* 110* 107* 96  BUN 12 16 9 17 16 8 16   CREATININE 4.43* 5.18* 3.04* 5.15* 5.03* 3.33* 5.13*  CALCIUM  8.9 8.6* 8.1* 8.0* 8.0* 8.2* 8.5*  PHOS  --  2.9 1.7* 4.5 4.5 2.7 3.6   CBC Recent Labs  Lab 03/17/24 1701 03/19/24 1309 03/20/24 0022 03/22/24 0317 03/23/24 0328  WBC 11.5* 9.5 8.0 7.5 7.3  NEUTROABS 9.4* 7.3  --   --   --   HGB 11.1* 10.2* 9.8* 9.5* 9.4*  HCT 34.8* 33.1* 30.9* 28.6* 29.2*  MCV 97.8 99.7 96.9 93.5 93.3  PLT 145* 148* 143* 139* 152    Medications:     amitriptyline   10 mg Oral QHS   amLODipine   10 mg Oral q morning   atorvastatin   40 mg Oral Daily   calcium  acetate  1,334 mg Oral TID WC   Chlorhexidine  Gluconate Cloth  6 each Topical Q0600   gabapentin   100 mg Oral QHS   guaiFENesin   600 mg Oral BID   heparin   5,000 Units Subcutaneous Q8H  heparin  sodium (porcine)  3,000 Units Intracatheter Once   hydrALAZINE   10 mg Oral Q8H   levothyroxine   50 mcg Oral Q0600   metoprolol  tartrate  50 mg Oral BID   mupirocin  ointment  1 Application Nasal BID   pantoprazole   40 mg Oral Daily   sodium chloride  flush  3 mL Intravenous Q12H   sodium chloride  flush  3 mL Intravenous Q12H    Almarie Bonine, MD 03/24/2024, 1:58 PM

## 2024-03-24 NOTE — Progress Notes (Signed)
 PROGRESS NOTE    [name] [name]  FMW:[id number] DOB: [birth year] DOA: 03/19/2024 PCP: Kip Righter, MD   Brief Narrative: This 63 y.o. male with medical history significant of hypertension, hyperlipidemia, diabetes mellitus type 2, ESRD on HD, hypothyroidism who presents with post-operative foot pain. 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 Patient noted that surgical site wound had opened up and he had redness present on the forefoot. He followed up with Dr. Alona today and due to to his symptoms and was advised to come to the hospital for further evaluation and need of IV antibiotics.  Patient was admitted for postoperative wound infection.  Podiatry is consulted,  vascular surgery is consulted.  Patient is s/p  angiogram followed by hemodialysis.  Assessment & Plan:   Principal Problem:   Postoperative wound infection Active Problems:   ESRD on dialysis (HCC)   PVD (peripheral vascular disease)   Anemia of chronic disease   Thrombocytopenia   Essential hypertension   Controlled type 2 diabetes mellitus without complication, without long-term current use of insulin  (HCC)   Hypothyroidism   Hyperlipidemia associated with type 2 diabetes mellitus (HCC)   GERD (gastroesophageal reflux disease)   Pyogenic inflammation of bone (HCC)   Wound dehiscence, surgical  Postoperative wound infection: Patient presented with increasing pain and erythema of his postoperative site. He recently underwent 2nd and 3rd left toe amputation due to gangrene on 02/14/2024 by Dr. Alona.   MRI noted full-thickness wound at the tip of the second metatarsal head. Patient had been started on doxycycline . - Blood cultures > No growth so far  - ESR and CRP elevated. - Continue empiric antibiotics vancomycin  and ampicillin. - Oxycodone  as needed for pain. - Podiatry consulted, recommended transmetatarsal amputation , tentatively scheduled 10/9 but patient  declined. -Appreciate vascular surgery, status post angiogram, Patient high risk for nonhealing even a TMA level, may ultimately require more proximal amputation if fails to heal. -Patient is now is scheduled for TMA on Mon. Patient is agreeable.   ESRD on HD: Continue hemodialysis as per nephrology.   Peripheral vascular disease: - Held aspirin  and Plavix  in case of need of procedure. - Patient s/p angiogram of left lower extremity 03/20/24.  Anemia of chronic disease: Chronic.Hemoglobin noted to be 10.2 which appears similar to prior. - No obvious visible bleeding.  Continue to monitor   Thrombocytopenia: - Continue to monitor. Appears at baseline.   Essential hypertension: Blood pressure elevated at 155/75. - Continue metoprolol  and amlodipine .   Controlled diabetes mellitus type 2 without long-term use of insulin  Last hemoglobin A1c noted to be 5.1.  Patient is not on any diabetic medications at baseline. - Continue renal and carb modified diet - Continue gabapentin .   Hypothyroidism: - Continue levothyroxine .   Hyperlipidemia: - Continue atorvastatin  .   GERD: - Continue PPI.  Hypokalemia/hypophosphatemia: Replaced.  Continue to monitor  DVT prophylaxis: Heparin  sq Code Status: Full code Family Communication: No family at bed side. Disposition Plan:    Status is: Inpatient Remains inpatient appropriate because: Severity of illness.   Consultants:  Vascular surgery Podiatry Nephrology  Procedures:  Antimicrobials:  Anti-infectives (From admission, onward)    Start     Dose/Rate Route Frequency Ordered Stop   03/27/24 1200  vancomycin  (VANCOCIN ) IVPB 1000 mg/200 mL premix        1,000 mg 200 mL/hr over 60 Minutes Intravenous Every M-W-F (Hemodialysis) 03/23/24 1325     03/20/24 1800  vancomycin  (  VANCOCIN ) IVPB 1000 mg/200 mL premix  Status:  Discontinued        1,000 mg 200 mL/hr over 60 Minutes Intravenous Every M-W-F (Hemodialysis) 03/20/24 1114  03/23/24 1325   03/20/24 1700  vancomycin  (VANCOCIN ) IVPB 1000 mg/200 mL premix  Status:  Discontinued        1,000 mg 200 mL/hr over 60 Minutes Intravenous Every M-W-F (Hemodialysis) 03/19/24 1642 03/20/24 0821   03/20/24 1700  Ampicillin-Sulbactam (UNASYN) 3 g in sodium chloride  0.9 % 100 mL IVPB  Status:  Discontinued        3 g 200 mL/hr over 30 Minutes Intravenous Every 24 hours 03/19/24 1642 03/20/24 0824   03/20/24 1700  Ampicillin-Sulbactam (UNASYN) 3 g in sodium chloride  0.9 % 100 mL IVPB        3 g 200 mL/hr over 30 Minutes Intravenous Every 12 hours 03/20/24 0824     03/20/24 0915  vancomycin  (VANCOREADY) IVPB 1500 mg/300 mL        1,500 mg 150 mL/hr over 120 Minutes Intravenous  Once 03/20/24 0821 03/20/24 1253   03/20/24 0821  vancomycin  variable dose per unstable renal function (pharmacist dosing)  Status:  Discontinued         Does not apply See admin instructions 03/20/24 0821 03/20/24 1114   03/19/24 1530  Ampicillin-Sulbactam (UNASYN) 3 g in sodium chloride  0.9 % 100 mL IVPB       Placed in And Linked Group   3 g 200 mL/hr over 30 Minutes Intravenous  Once 03/19/24 1516 03/20/24 0817   03/19/24 1530  vancomycin  (VANCOCIN ) IVPB 1000 mg/200 mL premix  Status:  Discontinued       Placed in And Linked Group   1,000 mg 200 mL/hr over 60 Minutes Intravenous  Once 03/19/24 1516 03/20/24 9178       Subjective: Patient was seen and examined at bedside. Overnight events noted. Patient states he has discussed with his mother and agreeable to amputation on Monday. Patient is tentatively scheduled for TMA  on Monday.  Objective: Vitals:   03/23/24 1838 03/23/24 2200 03/24/24 0500 03/24/24 0808  BP: (!) 162/72 106/82 (!) 150/81 (!) 146/80  Pulse:  70 63 67  Resp:      Temp:  97.9 F (36.6 C) 98.2 F (36.8 C) 97.8 F (36.6 C)  TempSrc:   Oral Oral  SpO2:  94% 97% 98%  Weight:      Height:        Intake/Output Summary (Last 24 hours) at 03/24/2024 1059 Last data  filed at 03/23/2024 1900 Gross per 24 hour  Intake 970.32 ml  Output 200 ml  Net 770.32 ml   Filed Weights   03/22/24 1511 03/22/24 1852 03/23/24 0618  Weight: 68 kg 67 kg 68.2 kg    Examination:  General exam: Appears calm and comfortable, deconditioned, not in any acute distress. Respiratory system: CTA Bilaterally . Respiratory effort normal.  RR 13 Cardiovascular system: S1 & S2 heard, RRR. No JVD, murmurs, rubs, gallops or clicks.  Gastrointestinal system: Abdomen is non distended, soft and non tender. Normal bowel sounds heard. Central nervous system: Alert and oriented X 3. No focal neurological deficits. Extremities: Amputated 3rd and 4th toes, visible bone with significant erythema noted with pus. Skin: No rashes, lesions or ulcers Psychiatry: Judgement and insight appear normal. Mood & affect appropriate.     Data Reviewed: I have personally reviewed following labs and imaging studies  CBC: Recent Labs  Lab 03/17/24 1701 03/19/24 1309 03/20/24  0022 03/22/24 0317 03/23/24 0328  WBC 11.5* 9.5 8.0 7.5 7.3  NEUTROABS 9.4* 7.3  --   --   --   HGB 11.1* 10.2* 9.8* 9.5* 9.4*  HCT 34.8* 33.1* 30.9* 28.6* 29.2*  MCV 97.8 99.7 96.9 93.5 93.3  PLT 145* 148* 143* 139* 152   Basic Metabolic Panel: Recent Labs  Lab 03/21/24 0300 03/22/24 0317 03/22/24 0318 03/23/24 0328 03/24/24 0812  NA 132* 131* 132* 133* 136  K 3.2* 4.0 4.0 3.6 4.0  CL 94* 92* 92* 94* 93*  CO2 25 24 22 26 22   GLUCOSE 147* 110* 110* 107* 96  BUN 9 17 16 8 16   CREATININE 3.04* 5.15* 5.03* 3.33* 5.13*  CALCIUM  8.1* 8.0* 8.0* 8.2* 8.5*  MG  --  1.7  --  1.7  --   PHOS 1.7* 4.5 4.5 2.7 3.6   GFR: Estimated Creatinine Clearance: 13.8 mL/min (A) (by C-G formula based on SCr of 5.13 mg/dL (H)). Liver Function Tests: Recent Labs  Lab 03/19/24 1309 03/20/24 0022 03/21/24 0300 03/22/24 0318 03/24/24 0812  AST 14*  --   --   --   --   ALT 7  --   --   --   --   ALKPHOS 83  --   --   --   --    BILITOT 0.7  --   --   --   --   PROT 6.7  --   --   --   --   ALBUMIN  2.9* 2.6* 2.4* 2.3* 2.4*   No results for input(s): LIPASE, AMYLASE in the last 168 hours. No results for input(s): AMMONIA in the last 168 hours. Coagulation Profile: No results for input(s): INR, PROTIME in the last 168 hours. Cardiac Enzymes: No results for input(s): CKTOTAL, CKMB, CKMBINDEX, TROPONINI in the last 168 hours. BNP (last 3 results) No results for input(s): PROBNP in the last 8760 hours. HbA1C: No results for input(s): HGBA1C in the last 72 hours. CBG: Recent Labs  Lab 03/20/24 1555 03/23/24 1644 03/23/24 2113 03/24/24 0606  GLUCAP 81 135* 127* 94   Lipid Profile: No results for input(s): CHOL, HDL, LDLCALC, TRIG, CHOLHDL, LDLDIRECT in the last 72 hours.  Thyroid  Function Tests: No results for input(s): TSH, T4TOTAL, FREET4, T3FREE, THYROIDAB in the last 72 hours. Anemia Panel: No results for input(s): VITAMINB12, FOLATE, FERRITIN, TIBC, IRON, RETICCTPCT in the last 72 hours. Sepsis Labs: Recent Labs  Lab 03/19/24 1327  LATICACIDVEN 1.4    Recent Results (from the past 240 hours)  Aerobic Culture w Gram Stain (superficial specimen)     Status: None   Collection Time: 03/17/24  7:59 PM   Specimen: Foot; Wound  Result Value Ref Range Status   Specimen Description   Final    FOOT Performed at [facility], [address], [place], [place] [address]    Special Requests   Final    NONE Performed at [facility], [address], [place], [place] [address]    Gram Stain   Final    RARE WBC PRESENT,BOTH PMN AND MONONUCLEAR MODERATE GRAM POSITIVE COCCI IN PAIRS IN CHAINS Performed at [facility], [address] [place], [place] [address]    Culture   Final    ABUNDANT METHICILLIN RESISTANT STAPHYLOCOCCUS AUREUS   Report Status 03/20/2024 FINAL  Final  Blood Cultures x 2 sites     Status: None   Collection Time:  03/19/24 12:43 PM   Specimen: BLOOD  LEFT HAND  Result Value Ref Range Status   Specimen Description BLOOD LEFT HAND  Final   Special Requests   Final    BOTTLES DRAWN AEROBIC AND ANAEROBIC Blood Culture results may not be optimal due to an inadequate volume of blood received in culture bottles   Culture   Final    NO GROWTH 5 DAYS Performed at [facility], [address] [place], [place] [address]    Report Status 03/24/2024 FINAL  Final  Blood Cultures x 2 sites     Status: None   Collection Time: 03/19/24  5:09 PM   Specimen: BLOOD  Result Value Ref Range Status   Specimen Description BLOOD LEFT ANTECUBITAL  Final   Special Requests   Final    BOTTLES DRAWN AEROBIC AND ANAEROBIC Blood Culture results may not be optimal due to an inadequate volume of blood received in culture bottles   Culture   Final    NO GROWTH 5 DAYS Performed at [facility], [address] [place], [place] [address]    Report Status 03/24/2024 FINAL  Final  SARS Coronavirus 2 by RT PCR (hospital order, performed in [facility] hospital lab) *cepheid single result test* Anterior Nasal Swab     Status: None   Collection Time: 03/19/24  6:22 PM   Specimen: Anterior Nasal Swab  Result Value Ref Range Status   SARS Coronavirus 2 by RT PCR NEGATIVE NEGATIVE Final    Comment: Performed at [facility], [address], [place], [place] [address]  Respiratory (~20 pathogens) panel by PCR     Status: None   Collection Time: 03/19/24  6:22 PM   Specimen: Nasopharyngeal Swab; Respiratory  Result Value Ref Range Status   Adenovirus NOT DETECTED NOT DETECTED Final   Coronavirus 229E NOT DETECTED NOT DETECTED Final    Comment: (NOTE) The Coronavirus on the Respiratory Panel, DOES NOT test for the novel  Coronavirus (2019 nCoV)    Coronavirus HKU1 NOT DETECTED NOT DETECTED Final   Coronavirus NL63 NOT DETECTED NOT DETECTED Final   Coronavirus OC43 NOT DETECTED NOT DETECTED Final   Metapneumovirus  NOT DETECTED NOT DETECTED Final   Rhinovirus / Enterovirus NOT DETECTED NOT DETECTED Final   Influenza A NOT DETECTED NOT DETECTED Final   Influenza B NOT DETECTED NOT DETECTED Final   Parainfluenza Virus 1 NOT DETECTED NOT DETECTED Final   Parainfluenza Virus 2 NOT DETECTED NOT DETECTED Final   Parainfluenza Virus 3 NOT DETECTED NOT DETECTED Final   Parainfluenza Virus 4 NOT DETECTED NOT DETECTED Final   Respiratory Syncytial Virus NOT DETECTED NOT DETECTED Final   Bordetella pertussis NOT DETECTED NOT DETECTED Final   Bordetella Parapertussis NOT DETECTED NOT DETECTED Final   Chlamydophila pneumoniae NOT DETECTED NOT DETECTED Final   Mycoplasma pneumoniae NOT DETECTED NOT DETECTED Final    Comment: Performed at [facility], [address] [place], [place] [address]  MRSA Next Gen by PCR, Nasal     Status: Abnormal   Collection Time: 03/20/24  8:21 PM   Specimen: Nasal Mucosa; Nasal Swab  Result Value Ref Range Status   MRSA by PCR Next Gen DETECTED (A) NOT DETECTED Final    Comment: CRITICAL RESULT CALLED TO, READ BACK BY AND VERIFIED WITH: RN SOTERO BUNDE [id number] [id number] BY J RAZZAK, MT (NOTE) The GeneXpert MRSA Assay (FDA approved for NASAL specimens only), is one component of a comprehensive MRSA colonization surveillance program. It is not  intended to diagnose MRSA infection nor to guide or monitor treatment for MRSA infections. Test performance is not FDA approved in patients less than 53 years old. Performed at [facility], [address] [place], [place] [address]     Radiology Studies: No results found.  Scheduled Meds:  amitriptyline   10 mg Oral QHS   amLODipine   10 mg Oral q morning   atorvastatin   40 mg Oral Daily   calcium  acetate  1,334 mg Oral TID WC   Chlorhexidine  Gluconate Cloth  6 each Topical Q0600   gabapentin   100 mg Oral QHS   guaiFENesin   600 mg Oral BID   heparin   5,000 Units Subcutaneous Q8H   heparin  sodium (porcine)  3,000 Units  Intracatheter Once   hydrALAZINE   10 mg Oral Q8H   levothyroxine   50 mcg Oral Q0600   metoprolol  tartrate  50 mg Oral BID   mupirocin  ointment  1 Application Nasal BID   pantoprazole   40 mg Oral Daily   sodium chloride  flush  3 mL Intravenous Q12H   sodium chloride  flush  3 mL Intravenous Q12H   Continuous Infusions:  ampicillin-sulbactam (UNASYN) IV 3 g (03/24/24 0542)   [START ON 03/27/2024] vancomycin        LOS: 5 days    Time spent: 35 mins   Darcel Dawley, MD [facility]   If 7PM-7AM, please contact night-coverage

## 2024-03-25 ENCOUNTER — Inpatient Hospital Stay (HOSPITAL_COMMUNITY)

## 2024-03-25 ENCOUNTER — Other Ambulatory Visit: Payer: Self-pay

## 2024-03-25 ENCOUNTER — Encounter (HOSPITAL_COMMUNITY): Payer: Self-pay | Admitting: Internal Medicine

## 2024-03-25 ENCOUNTER — Encounter (HOSPITAL_COMMUNITY): Admission: EM | Disposition: A | Payer: Self-pay | Source: Home / Self Care | Attending: Family Medicine

## 2024-03-25 DIAGNOSIS — M869 Osteomyelitis, unspecified: Secondary | ICD-10-CM | POA: Diagnosis not present

## 2024-03-25 DIAGNOSIS — I96 Gangrene, not elsewhere classified: Secondary | ICD-10-CM | POA: Diagnosis not present

## 2024-03-25 DIAGNOSIS — I5043 Acute on chronic combined systolic (congestive) and diastolic (congestive) heart failure: Secondary | ICD-10-CM | POA: Diagnosis not present

## 2024-03-25 DIAGNOSIS — I110 Hypertensive heart disease with heart failure: Secondary | ICD-10-CM | POA: Diagnosis not present

## 2024-03-25 DIAGNOSIS — E1169 Type 2 diabetes mellitus with other specified complication: Secondary | ICD-10-CM

## 2024-03-25 DIAGNOSIS — Z1152 Encounter for screening for COVID-19: Secondary | ICD-10-CM | POA: Diagnosis not present

## 2024-03-25 DIAGNOSIS — T8141XA Infection following a procedure, superficial incisional surgical site, initial encounter: Secondary | ICD-10-CM | POA: Diagnosis not present

## 2024-03-25 DIAGNOSIS — Z8616 Personal history of COVID-19: Secondary | ICD-10-CM | POA: Diagnosis not present

## 2024-03-25 DIAGNOSIS — M86172 Other acute osteomyelitis, left ankle and foot: Secondary | ICD-10-CM

## 2024-03-25 DIAGNOSIS — T8149XA Infection following a procedure, other surgical site, initial encounter: Secondary | ICD-10-CM | POA: Diagnosis not present

## 2024-03-25 DIAGNOSIS — N186 End stage renal disease: Secondary | ICD-10-CM | POA: Diagnosis not present

## 2024-03-25 HISTORY — PX: AMPUTATION: SHX166

## 2024-03-25 LAB — GLUCOSE, CAPILLARY
Glucose-Capillary: 82 mg/dL (ref 70–99)
Glucose-Capillary: 79 mg/dL (ref 70–99)

## 2024-03-25 LAB — RENAL FUNCTION PANEL
Albumin: 2.3 g/dL — ABNORMAL LOW (ref 3.5–5.0)
Anion gap: 19 — ABNORMAL HIGH (ref 5–15)
BUN: 21 mg/dL (ref 8–23)
CO2: 20 mmol/L — ABNORMAL LOW (ref 22–32)
Calcium: 8.5 mg/dL — ABNORMAL LOW (ref 8.9–10.3)
Chloride: 93 mmol/L — ABNORMAL LOW (ref 98–111)
Creatinine, Ser: 6.68 mg/dL — ABNORMAL HIGH (ref 0.61–1.24)
GFR, Estimated: 9 mL/min — ABNORMAL LOW (ref >60)
Glucose, Bld: 103 mg/dL — ABNORMAL HIGH (ref 70–99)
Phosphorus: 4 mg/dL (ref 2.5–4.6)
Potassium: 4.2 mmol/L (ref 3.5–5.1)
Sodium: 132 mmol/L — ABNORMAL LOW (ref 135–145)

## 2024-03-25 SURGERY — AMPUTATION, FOOT, RAY
Anesthesia: Monitor Anesthesia Care | Site: Foot | Laterality: Left

## 2024-03-25 MED ORDER — SODIUM CHLORIDE 0.9 % IV SOLN: INTRAVENOUS | Status: DC

## 2024-03-25 MED ORDER — PROPOFOL 500 MG/50ML IV EMUL
INTRAVENOUS | Status: DC | PRN
Start: 2024-03-25 — End: 2024-03-25
  Administered 2024-03-25: 40 ug/kg/min via INTRAVENOUS

## 2024-03-25 MED ORDER — LIDOCAINE 2% (20 MG/ML) 5 ML SYRINGE
INTRAMUSCULAR | Status: AC
  Filled 2024-03-25: qty 5

## 2024-03-25 MED ORDER — ORAL CARE MOUTH RINSE: 15.0000 mL | Freq: Once | OROMUCOSAL | Status: AC

## 2024-03-25 MED ORDER — FENTANYL CITRATE (PF) 250 MCG/5ML IJ SOLN
INTRAMUSCULAR | Status: AC
  Filled 2024-03-25: qty 5

## 2024-03-25 MED ORDER — MIDAZOLAM HCL 2 MG/2ML IJ SOLN
INTRAMUSCULAR | Status: DC | PRN
  Administered 2024-03-25: 1 mg via INTRAVENOUS

## 2024-03-25 MED ORDER — LIDOCAINE HCL 1 % IJ SOLN
INTRAMUSCULAR | Status: DC | PRN
  Administered 2024-03-25: 20 mL via INTRAMUSCULAR

## 2024-03-25 MED ORDER — LIDOCAINE HCL (PF) 1 % IJ SOLN
INTRAMUSCULAR | Status: AC
  Filled 2024-03-25: qty 30

## 2024-03-25 MED ORDER — OXYCODONE HCL 5 MG/5ML PO SOLN: 5.0000 mg | Freq: Once | ORAL | Status: DC | PRN

## 2024-03-25 MED ORDER — BUPIVACAINE HCL (PF) 0.5 % IJ SOLN
INTRAMUSCULAR | Status: AC
  Filled 2024-03-25: qty 30

## 2024-03-25 MED ORDER — HALOPERIDOL LACTATE 5 MG/ML IJ SOLN: 2.0000 mg | Freq: Once | INTRAMUSCULAR | Status: DC

## 2024-03-25 MED ORDER — CHLORHEXIDINE GLUCONATE 0.12 % MT SOLN
15.0000 mL | Freq: Once | OROMUCOSAL | Status: AC
  Administered 2024-03-25: 15 mL via OROMUCOSAL

## 2024-03-25 MED ORDER — HALOPERIDOL LACTATE 5 MG/ML IJ SOLN
2.0000 mg | Freq: Four times a day (QID) | INTRAMUSCULAR | Status: DC | PRN
  Filled 2024-03-25: qty 1

## 2024-03-25 MED ORDER — HALOPERIDOL LACTATE 5 MG/ML IJ SOLN: 2.0000 mg | Freq: Four times a day (QID) | INTRAMUSCULAR | Status: DC | PRN

## 2024-03-25 MED ORDER — INSULIN ASPART 100 UNIT/ML IJ SOLN: 0.0000 [IU] | INTRAMUSCULAR | Status: DC | PRN

## 2024-03-25 MED ORDER — VANCOMYCIN HCL 500 MG IV SOLR
INTRAVENOUS | Status: AC
Start: 2024-03-25 — End: 2024-03-25
  Filled 2024-03-25: qty 10

## 2024-03-25 MED ORDER — MIDAZOLAM HCL 2 MG/2ML IJ SOLN
INTRAMUSCULAR | Status: AC
Start: 2024-03-25 — End: 2024-03-25
  Filled 2024-03-25: qty 2

## 2024-03-25 MED ORDER — OXYCODONE HCL 5 MG PO TABS: 5.0000 mg | ORAL_TABLET | Freq: Once | ORAL | Status: DC | PRN

## 2024-03-25 MED ORDER — SODIUM CHLORIDE 0.9 % IR SOLN
Status: DC | PRN
  Administered 2024-03-25: 1000 mL

## 2024-03-25 MED ORDER — LIDOCAINE 2% (20 MG/ML) 5 ML SYRINGE
INTRAMUSCULAR | Status: DC | PRN
  Administered 2024-03-25: 60 mg via INTRAVENOUS

## 2024-03-25 MED ORDER — FENTANYL CITRATE (PF) 100 MCG/2ML IJ SOLN: 25.0000 ug | INTRAMUSCULAR | Status: DC | PRN

## 2024-03-25 MED ORDER — TOBRAMYCIN SULFATE 80 MG/2ML IJ SOLN
INTRAMUSCULAR | Status: AC
  Filled 2024-03-25: qty 2

## 2024-03-25 MED ORDER — FENTANYL CITRATE (PF) 250 MCG/5ML IJ SOLN
INTRAMUSCULAR | Status: DC | PRN
  Administered 2024-03-25: 50 ug via INTRAVENOUS

## 2024-03-25 MED ORDER — DEXAMETHASONE SOD PHOSPHATE PF 10 MG/ML IJ SOLN
INTRAMUSCULAR | Status: DC | PRN
  Administered 2024-03-25: 8 mg via INTRAVENOUS

## 2024-03-25 MED ORDER — ONDANSETRON HCL 4 MG/2ML IJ SOLN
INTRAMUSCULAR | Status: AC
  Filled 2024-03-25: qty 2

## 2024-03-25 SURGICAL SUPPLY — 38 items
ALLOGRAFT AMNI BIOVANCE 5X5 1L (Graft) IMPLANT
BLADE AVERAGE 25X9 (BLADE) IMPLANT
BLADE SURG 10 STRL SS (BLADE) ×1 IMPLANT
BLADE SURG 15 STRL LF DISP TIS (BLADE) ×1 IMPLANT
BNDG COHESIVE 3X5 TAN ST LF (GAUZE/BANDAGES/DRESSINGS) ×1 IMPLANT
BNDG COMPR ESMARK 4X3 LF (GAUZE/BANDAGES/DRESSINGS) ×1 IMPLANT
BNDG ELASTIC 3INX 5YD STR LF (GAUZE/BANDAGES/DRESSINGS) ×1 IMPLANT
BNDG ELASTIC 4INX 5YD STR LF (GAUZE/BANDAGES/DRESSINGS) IMPLANT
BNDG GAUZE DERMACEA FLUFF 4 (GAUZE/BANDAGES/DRESSINGS) IMPLANT
CHLORAPREP W/TINT 26 (MISCELLANEOUS) IMPLANT
DRSG ADAPTIC 3X8 NADH LF (GAUZE/BANDAGES/DRESSINGS) IMPLANT
DRSG XEROFORM 1X8 (GAUZE/BANDAGES/DRESSINGS) IMPLANT
ELECTRODE REM PT RTRN 9FT ADLT (ELECTROSURGICAL) ×1 IMPLANT
GAUZE PAD ABD 8X10 STRL (GAUZE/BANDAGES/DRESSINGS) IMPLANT
GAUZE SPONGE 2X2 STRL 8-PLY (GAUZE/BANDAGES/DRESSINGS) IMPLANT
GAUZE SPONGE 4X4 12PLY STRL (GAUZE/BANDAGES/DRESSINGS) ×1 IMPLANT
GAUZE STRETCH 2X75IN STRL (MISCELLANEOUS) ×1 IMPLANT
GAUZE XEROFORM 1X8 LF (GAUZE/BANDAGES/DRESSINGS) ×1 IMPLANT
GLOVE BIO SURGEON STRL SZ7.5 (GLOVE) ×1 IMPLANT
GLOVE BIOGEL PI IND STRL 7.5 (GLOVE) ×1 IMPLANT
GOWN STRL REUS W/ TWL LRG LVL3 (GOWN DISPOSABLE) ×2 IMPLANT
KIT BASIN OR (CUSTOM PROCEDURE TRAY) ×1 IMPLANT
NDL HYPO 25X1 1.5 SAFETY (NEEDLE) ×1 IMPLANT
NEEDLE HYPO 25X1 1.5 SAFETY (NEEDLE) ×1 IMPLANT
PACK ORTHO EXTREMITY (CUSTOM PROCEDURE TRAY) ×1 IMPLANT
PADDING CAST ABS COTTON 4X4 ST (CAST SUPPLIES) ×2 IMPLANT
SET HNDPC FAN SPRY TIP SCT (DISPOSABLE) IMPLANT
SOLN STERILE WATER 1000 ML (IV SOLUTION) ×1 IMPLANT
SOLN STERILE WATER BTL 1000 ML (IV SOLUTION) ×1 IMPLANT
SPIKE FLUID TRANSFER (MISCELLANEOUS) IMPLANT
STOCKINETTE 4X48 STRL (DRAPES) IMPLANT
SUT ETHILON 3 0 FSLX (SUTURE) IMPLANT
SUT PROLENE 3 0 PS 2 (SUTURE) IMPLANT
SUT PROLENE 4 0 PS 2 18 (SUTURE) IMPLANT
SYR CONTROL 10ML LL (SYRINGE) ×1 IMPLANT
TUBE CONNECTING 12X1/4 (SUCTIONS) IMPLANT
UNDERPAD 30X36 HEAVY ABSORB (UNDERPADS AND DIAPERS) ×1 IMPLANT
YANKAUER SUCT BULB TIP NO VENT (SUCTIONS) IMPLANT

## 2024-03-25 NOTE — Progress Notes (Signed)
 Patient who was noted as confused at shift change, only oriented to self has now pulled out his peripheral IV and has removed his HD catheter dressing. Patient is also attempting to climb oob and requesting RN to call his mother [name] to come pick him up. He has refused his bedtime medication stating he has already taken what he needs. Paged on-call physician to request medication for agitation.

## 2024-03-25 NOTE — Transfer of Care (Signed)
 Immediate Anesthesia Transfer of Care Note  Patient: [name] [name]  Procedure(s) Performed: AMPUTATION, FOOT, RAY LEFT (Left: Foot)  Patient Location: PACU  Anesthesia Type:MAC  Level of Consciousness: awake and sedated  Airway & Oxygen Therapy: Patient Spontanous Breathing and Patient connected to face mask oxygen  Post-op Assessment: Report given to RN and Post -op Vital signs reviewed and stable  Post vital signs: Reviewed and stable  Last Vitals:  Vitals Value Taken Time  BP 129/51 03/25/24 14:10  Temp    Pulse 52 03/25/24 14:12  Resp 17 03/25/24 14:12  SpO2 91 % 03/25/24 14:12  Vitals shown include unfiled device data.  Last Pain:  Vitals:   03/25/24 1226  TempSrc:   PainSc: 0-No pain      Patients Stated Pain Goal: 0 (03/20/24 0835)  Complications: No notable events documented.

## 2024-03-25 NOTE — Progress Notes (Signed)
 PROGRESS NOTE    [name] [name]  FMW:[id number] DOB: [birth year] DOA: 03/19/2024 PCP: Kip Righter, MD   Brief Narrative: This 63 y.o. male with medical history significant of hypertension, hyperlipidemia, diabetes mellitus type 2, ESRD on HD, hypothyroidism who presents with post-operative foot pain. 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 Patient noted that surgical site wound had opened up and he had redness present on the forefoot. He followed up with Dr. Alona today and due to to his symptoms and was advised to come to the hospital for further evaluation and need of IV antibiotics.  Patient was admitted for postoperative wound infection.  Podiatry is consulted,  vascular surgery is consulted.  Patient is s/p  angiogram followed by hemodialysis.  Assessment & Plan:   Principal Problem:   Postoperative wound infection Active Problems:   ESRD on dialysis (HCC)   PVD (peripheral vascular disease)   Anemia of chronic disease   Thrombocytopenia   Essential hypertension   Controlled type 2 diabetes mellitus without complication, without long-term current use of insulin  (HCC)   Hypothyroidism   Hyperlipidemia associated with type 2 diabetes mellitus (HCC)   GERD (gastroesophageal reflux disease)   Pyogenic inflammation of bone (HCC)   Wound dehiscence, surgical  Postoperative wound infection: Patient presented with increasing pain and erythema of his postoperative site. He recently underwent 2nd and 3rd left toe amputation due to gangrene on 02/14/2024 by Dr. Alona.   MRI noted full-thickness wound at the tip of the second metatarsal head. Patient had been started on doxycycline . - Blood cultures > No growth so far  - ESR and CRP elevated. - Continue empiric antibiotics vancomycin  and ampicillin. - Oxycodone  as needed for pain. - Podiatry consulted, recommended transmetatarsal amputation , tentatively scheduled 10/9 but patient  declined. -Appreciate vascular surgery, status post angiogram, Patient high risk for nonhealing even a TMA level, may ultimately require more proximal amputation if fails to heal. -Patient is scheduled for TMA today.   ESRD on HD: Continue hemodialysis as per nephrology.   Peripheral vascular disease: - Held aspirin  and Plavix  in case of need of procedure. - Patient s/p angiogram of left lower extremity 03/20/24.  Anemia of chronic disease: Chronic.Hemoglobin noted to be 10.2 which appears similar to prior. - No obvious visible bleeding.  Continue to monitor   Thrombocytopenia: - Continue to monitor. Appears at baseline.   Essential hypertension: Blood pressure elevated at 155/75. - Continue metoprolol  and amlodipine .   Controlled diabetes mellitus type 2 without long-term use of insulin  Last hemoglobin A1c noted to be 5.1.  Patient is not on any diabetic medications at baseline. - Continue renal and carb modified diet - Continue gabapentin .   Hypothyroidism: - Continue levothyroxine .   Hyperlipidemia: - Continue atorvastatin  .   GERD: - Continue PPI.  Hypokalemia/hypophosphatemia: Replaced.  Continue to monitor  DVT prophylaxis: Heparin  sq Code Status: Full code Family Communication: No family at bed side. Disposition Plan:    Status is: Inpatient Remains inpatient appropriate because: Severity of illness.   Consultants:  Vascular surgery Podiatry Nephrology  Procedures:  Antimicrobials:  Anti-infectives (From admission, onward)    Start     Dose/Rate Route Frequency Ordered Stop   03/27/24 1200  [MAR Hold]  vancomycin  (VANCOCIN ) IVPB 1000 mg/200 mL premix        (MAR Hold since Mon 03/25/2024 at 1214.Hold Reason: Transfer to a Procedural area)   1,000 mg 200 mL/hr over 60 Minutes Intravenous Every  M-W-F (Hemodialysis) 03/23/24 1325     03/20/24 1800  vancomycin  (VANCOCIN ) IVPB 1000 mg/200 mL premix  Status:  Discontinued        1,000 mg 200 mL/hr over 60  Minutes Intravenous Every M-W-F (Hemodialysis) 03/20/24 1114 03/23/24 1325   03/20/24 1700  vancomycin  (VANCOCIN ) IVPB 1000 mg/200 mL premix  Status:  Discontinued        1,000 mg 200 mL/hr over 60 Minutes Intravenous Every M-W-F (Hemodialysis) 03/19/24 1642 03/20/24 0821   03/20/24 1700  Ampicillin-Sulbactam (UNASYN) 3 g in sodium chloride  0.9 % 100 mL IVPB  Status:  Discontinued        3 g 200 mL/hr over 30 Minutes Intravenous Every 24 hours 03/19/24 1642 03/20/24 0824   03/20/24 1700  [MAR Hold]  Ampicillin-Sulbactam (UNASYN) 3 g in sodium chloride  0.9 % 100 mL IVPB        (MAR Hold since Mon 03/25/2024 at 1214.Hold Reason: Transfer to a Procedural area)   3 g 200 mL/hr over 30 Minutes Intravenous Every 12 hours 03/20/24 0824     03/20/24 0915  vancomycin  (VANCOREADY) IVPB 1500 mg/300 mL        1,500 mg 150 mL/hr over 120 Minutes Intravenous  Once 03/20/24 0821 03/20/24 1253   03/20/24 0821  vancomycin  variable dose per unstable renal function (pharmacist dosing)  Status:  Discontinued         Does not apply See admin instructions 03/20/24 0821 03/20/24 1114   03/19/24 1530  Ampicillin-Sulbactam (UNASYN) 3 g in sodium chloride  0.9 % 100 mL IVPB       Placed in And Linked Group   3 g 200 mL/hr over 30 Minutes Intravenous  Once 03/19/24 1516 03/20/24 0817   03/19/24 1530  vancomycin  (VANCOCIN ) IVPB 1000 mg/200 mL premix  Status:  Discontinued       Placed in And Linked Group   1,000 mg 200 mL/hr over 60 Minutes Intravenous  Once 03/19/24 1516 03/20/24 9178       Subjective: Patient was seen and examined at bedside. Overnight events noted. Patient states he has discussed with his mother and agreeable to amputation on Monday. Patient is scheduled for TMA today.  Objective: Vitals:   03/25/24 0812 03/25/24 1054 03/25/24 1100 03/25/24 1216  BP: 115/78 (!) 146/72 (!) 146/72 (!) 136/90  Pulse: 63 63 63 67  Resp:    18  Temp: 97.9 F (36.6 C)  97.8 F (36.6 C) 98.2 F (36.8 C)   TempSrc: Oral  Oral Oral  SpO2: 95%  96% 96%  Weight:    68.2 kg  Height:    5' 10 (1.778 m)    Intake/Output Summary (Last 24 hours) at 03/25/2024 1418 Last data filed at 03/25/2024 1413 Gross per 24 hour  Intake 106.55 ml  Output 5 ml  Net 101.55 ml    Filed Weights   03/22/24 1852 03/23/24 0618 03/25/24 1216  Weight: 67 kg 68.2 kg 68.2 kg    Examination:  General exam: Appears calm and comfortable, deconditioned, not in any acute distress. Respiratory system: CTA Bilaterally . Respiratory effort normal.  RR 13 Cardiovascular system: S1 & S2 heard, RRR. No JVD, murmurs, rubs, gallops or clicks.  Gastrointestinal system: Abdomen is non distended, soft and non tender. Normal bowel sounds heard. Central nervous system: Alert and oriented X 3. No focal neurological deficits. Extremities: Amputated 3rd and 4th toes, visible bone with significant erythema noted with pus. Skin: No rashes, lesions or ulcers Psychiatry: Judgement and insight  appear normal. Mood & affect appropriate.     Data Reviewed: I have personally reviewed following labs and imaging studies  CBC: Recent Labs  Lab 03/19/24 1309 03/20/24 0022 03/22/24 0317 03/23/24 0328  WBC 9.5 8.0 7.5 7.3  NEUTROABS 7.3  --   --   --   HGB 10.2* 9.8* 9.5* 9.4*  HCT 33.1* 30.9* 28.6* 29.2*  MCV 99.7 96.9 93.5 93.3  PLT 148* 143* 139* 152   Basic Metabolic Panel: Recent Labs  Lab 03/22/24 0317 03/22/24 0318 03/23/24 0328 03/24/24 0812 03/25/24 0411  NA 131* 132* 133* 136 132*  K 4.0 4.0 3.6 4.0 4.2  CL 92* 92* 94* 93* 93*  CO2 24 22 26 22  20*  GLUCOSE 110* 110* 107* 96 103*  BUN 17 16 8 16 21   CREATININE 5.15* 5.03* 3.33* 5.13* 6.68*  CALCIUM  8.0* 8.0* 8.2* 8.5* 8.5*  MG 1.7  --  1.7  --   --   PHOS 4.5 4.5 2.7 3.6 4.0   GFR: Estimated Creatinine Clearance: 10.6 mL/min (A) (by C-G formula based on SCr of 6.68 mg/dL (H)). Liver Function Tests: Recent Labs  Lab 03/19/24 1309 03/20/24 0022  03/21/24 0300 03/22/24 0318 03/24/24 0812 03/25/24 0411  AST 14*  --   --   --   --   --   ALT 7  --   --   --   --   --   ALKPHOS 83  --   --   --   --   --   BILITOT 0.7  --   --   --   --   --   PROT 6.7  --   --   --   --   --   ALBUMIN  2.9* 2.6* 2.4* 2.3* 2.4* 2.3*   No results for input(s): LIPASE, AMYLASE in the last 168 hours. No results for input(s): AMMONIA in the last 168 hours. Coagulation Profile: No results for input(s): INR, PROTIME in the last 168 hours. Cardiac Enzymes: No results for input(s): CKTOTAL, CKMB, CKMBINDEX, TROPONINI in the last 168 hours. BNP (last 3 results) No results for input(s): PROBNP in the last 8760 hours. HbA1C: No results for input(s): HGBA1C in the last 72 hours. CBG: Recent Labs  Lab 03/20/24 1555 03/23/24 1644 03/23/24 2113 03/24/24 0606 03/25/24 1224  GLUCAP 81 135* 127* 94 82   Lipid Profile: No results for input(s): CHOL, HDL, LDLCALC, TRIG, CHOLHDL, LDLDIRECT in the last 72 hours.  Thyroid  Function Tests: No results for input(s): TSH, T4TOTAL, FREET4, T3FREE, THYROIDAB in the last 72 hours. Anemia Panel: No results for input(s): VITAMINB12, FOLATE, FERRITIN, TIBC, IRON, RETICCTPCT in the last 72 hours. Sepsis Labs: Recent Labs  Lab 03/19/24 1327  LATICACIDVEN 1.4    Recent Results (from the past 240 hours)  Aerobic Culture w Gram Stain (superficial specimen)     Status: None   Collection Time: 03/17/24  7:59 PM   Specimen: Foot; Wound  Result Value Ref Range Status   Specimen Description   Final    FOOT Performed at [facility], [address], [place], [place] [address]    Special Requests   Final    NONE Performed at [facility], [address], [place], [place] [address]    Gram Stain   Final    RARE WBC PRESENT,BOTH PMN AND MONONUCLEAR MODERATE GRAM POSITIVE COCCI IN PAIRS IN CHAINS Performed at [facility], [address], [place],  [place] [address]  Culture   Final    ABUNDANT METHICILLIN RESISTANT STAPHYLOCOCCUS AUREUS   Report Status 03/20/2024 FINAL  Final  Blood Cultures x 2 sites     Status: None   Collection Time: 03/19/24 12:43 PM   Specimen: BLOOD LEFT HAND  Result Value Ref Range Status   Specimen Description BLOOD LEFT HAND  Final   Special Requests   Final    BOTTLES DRAWN AEROBIC AND ANAEROBIC Blood Culture results may not be optimal due to an inadequate volume of blood received in culture bottles   Culture   Final    NO GROWTH 5 DAYS Performed at [facility], [address], [place], [place] [address]    Report Status 03/24/2024 FINAL  Final  Blood Cultures x 2 sites     Status: None   Collection Time: 03/19/24  5:09 PM   Specimen: BLOOD  Result Value Ref Range Status   Specimen Description BLOOD LEFT ANTECUBITAL  Final   Special Requests   Final    BOTTLES DRAWN AEROBIC AND ANAEROBIC Blood Culture results may not be optimal due to an inadequate volume of blood received in culture bottles   Culture   Final    NO GROWTH 5 DAYS Performed at [facility], [address], [place], [place] [address]    Report Status 03/24/2024 FINAL  Final  SARS Coronavirus 2 by RT PCR (hospital order, performed in [facility] hospital lab) *cepheid single result test* Anterior Nasal Swab     Status: None   Collection Time: 03/19/24  6:22 PM   Specimen: Anterior Nasal Swab  Result Value Ref Range Status   SARS Coronavirus 2 by RT PCR NEGATIVE NEGATIVE Final    Comment: Performed at [facility], [address] [place], [place] [address]  Respiratory (~20 pathogens) panel by PCR     Status: None   Collection Time: 03/19/24  6:22 PM   Specimen: Nasopharyngeal Swab; Respiratory  Result Value Ref Range Status   Adenovirus NOT DETECTED NOT DETECTED Final   Coronavirus 229E NOT DETECTED NOT DETECTED Final    Comment: (NOTE) The Coronavirus on the Respiratory Panel, DOES NOT test for the novel   Coronavirus (2019 nCoV)    Coronavirus HKU1 NOT DETECTED NOT DETECTED Final   Coronavirus NL63 NOT DETECTED NOT DETECTED Final   Coronavirus OC43 NOT DETECTED NOT DETECTED Final   Metapneumovirus NOT DETECTED NOT DETECTED Final   Rhinovirus / Enterovirus NOT DETECTED NOT DETECTED Final   Influenza A NOT DETECTED NOT DETECTED Final   Influenza B NOT DETECTED NOT DETECTED Final   Parainfluenza Virus 1 NOT DETECTED NOT DETECTED Final   Parainfluenza Virus 2 NOT DETECTED NOT DETECTED Final   Parainfluenza Virus 3 NOT DETECTED NOT DETECTED Final   Parainfluenza Virus 4 NOT DETECTED NOT DETECTED Final   Respiratory Syncytial Virus NOT DETECTED NOT DETECTED Final   Bordetella pertussis NOT DETECTED NOT DETECTED Final   Bordetella Parapertussis NOT DETECTED NOT DETECTED Final   Chlamydophila pneumoniae NOT DETECTED NOT DETECTED Final   Mycoplasma pneumoniae NOT DETECTED NOT DETECTED Final    Comment: Performed at [facility], [address], [place], [place] [address]  MRSA Next Gen by PCR, Nasal     Status: Abnormal   Collection Time: 03/20/24  8:21 PM   Specimen: Nasal Mucosa; Nasal Swab  Result Value Ref Range Status   MRSA by PCR Next Gen DETECTED (A) NOT DETECTED Final    Comment:  CRITICAL RESULT CALLED TO, READ BACK BY AND VERIFIED WITH: RN SOTERO DAVIDSON [phone] [id number] BY J RAZZAK, MT (NOTE) The GeneXpert MRSA Assay (FDA approved for NASAL specimens only), is one component of a comprehensive MRSA colonization surveillance program. It is not intended to diagnose MRSA infection nor to guide or monitor treatment for MRSA infections. Test performance is not FDA approved in patients less than 83 years old. Performed at [facility], [address], [place], [place] [address]     Radiology Studies: No results found.  Scheduled Meds:  [MAR Hold] amitriptyline   10 mg Oral QHS   [MAR Hold] amLODipine   10 mg Oral q morning   [MAR Hold] atorvastatin   40 mg Oral Daily    [MAR Hold] calcium  acetate  1,334 mg Oral TID WC   [MAR Hold] Chlorhexidine  Gluconate Cloth  6 each Topical Q0600   [MAR Hold] gabapentin   100 mg Oral QHS   [MAR Hold] guaiFENesin   600 mg Oral BID   [MAR Hold] heparin   5,000 Units Subcutaneous Q8H   [MAR Hold] heparin  sodium (porcine)  3,000 Units Intracatheter Once   [MAR Hold] hydrALAZINE   10 mg Oral Q8H   [MAR Hold] levothyroxine   50 mcg Oral Q0600   [MAR Hold] metoprolol  tartrate  50 mg Oral BID   [MAR Hold] mupirocin  ointment  1 Application Nasal BID   [MAR Hold] pantoprazole   40 mg Oral Daily   [MAR Hold] sodium chloride  flush  3 mL Intravenous Q12H   [MAR Hold] sodium chloride  flush  3 mL Intravenous Q12H   Continuous Infusions:  sodium chloride  Stopped (03/25/24 1411)   [MAR Hold] ampicillin-sulbactam (UNASYN) IV 3 g (03/25/24 0600)   [MAR Hold] vancomycin        LOS: 6 days    Time spent: 35 mins   Darcel Dawley, MD [facility]   If 7PM-7AM, please contact night-coverage

## 2024-03-25 NOTE — Progress Notes (Signed)
 [facility] Progress Note   Subjective:    Seen and examined patient at bedside. Plan for TMA of L foot sometime today. Plan to schedule today's HD around procedure.  Objective Vitals:   03/25/24 1410 03/25/24 1415 03/25/24 1430 03/25/24 1450  BP: (!) 129/51 (!) 122/49 (!) 146/65 139/71  Pulse: (!) 54 (!) 52 (!) 54 (!) 57  Resp: 20 19 13 15   Temp: 97.9 F (36.6 C)  97.9 F (36.6 C) 98 F (36.7 C)  TempSrc:    Oral  SpO2: 93% 91% 96% 98%  Weight:      Height:       Physical Exam General: Awake, alert, on RA, NAD Heart: S1 and S2; No murmurs, gallops, or rubs Lungs: Clear anteriorly Abdomen: Soft and non-tender Extremities: No LE edema; L non-healing toe amputation site Dialysis Access: [facility]   Filed Weights   03/22/24 1852 03/23/24 0618 03/25/24 1216  Weight: 67 kg 68.2 kg 68.2 kg    Intake/Output Summary (Last 24 hours) at 03/25/2024 1601 Last data filed at 03/25/2024 1413 Gross per 24 hour  Intake 106.55 ml  Output 5 ml  Net 101.55 ml    Additional Objective Labs: Basic Metabolic Panel: Recent Labs  Lab 03/23/24 0328 03/24/24 0812 03/25/24 0411  NA 133* 136 132*  K 3.6 4.0 4.2  CL 94* 93* 93*  CO2 26 22 20*  GLUCOSE 107* 96 103*  BUN 8 16 21   CREATININE 3.33* 5.13* 6.68*  CALCIUM  8.2* 8.5* 8.5*  PHOS 2.7 3.6 4.0   Liver Function Tests: Recent Labs  Lab 03/19/24 1309 03/20/24 0022 03/22/24 0318 03/24/24 0812 03/25/24 0411  AST 14*  --   --   --   --   ALT 7  --   --   --   --   ALKPHOS 83  --   --   --   --   BILITOT 0.7  --   --   --   --   PROT 6.7  --   --   --   --   ALBUMIN  2.9*   < > 2.3* 2.4* 2.3*   < > = values in this interval not displayed.   No results for input(s): LIPASE, AMYLASE in the last 168 hours. CBC: Recent Labs  Lab 03/19/24 1309 03/20/24 0022 03/22/24 0317 03/23/24 0328  WBC 9.5 8.0 7.5 7.3  NEUTROABS 7.3  --   --   --   HGB 10.2* 9.8* 9.5* 9.4*  HCT 33.1* 30.9* 28.6* 29.2*  MCV 99.7 96.9  93.5 93.3  PLT 148* 143* 139* 152   Blood Culture    Component Value Date/Time   SDES BLOOD LEFT ANTECUBITAL 03/19/2024 1709   SPECREQUEST  03/19/2024 1709    BOTTLES DRAWN AEROBIC AND ANAEROBIC Blood Culture results may not be optimal due to an inadequate volume of blood received in culture bottles   CULT  03/19/2024 1709    NO GROWTH 5 DAYS Performed at [facility], [address], [place], [place] [address]    REPTSTATUS 03/24/2024 FINAL 03/19/2024 1709    Cardiac Enzymes: No results for input(s): CKTOTAL, CKMB, CKMBINDEX, TROPONINI in the last 168 hours. CBG: Recent Labs  Lab 03/23/24 1644 03/23/24 2113 03/24/24 0606 03/25/24 1224 03/25/24 1420  GLUCAP 135* 127* 94 82 79   Iron Studies: No results for input(s): IRON, TIBC, TRANSFERRIN, FERRITIN in the last 72 hours. Lab Results  Component Value Date   INR 1.1 05/19/2023  INR 1.1 03/06/2023   INR 1.1 08/30/2020   Studies/Results: No results found.  Medications:  ampicillin-sulbactam (UNASYN) IV 3 g (03/25/24 0600)   [START ON 03/27/2024] vancomycin       amitriptyline   10 mg Oral QHS   amLODipine   10 mg Oral q morning   atorvastatin   40 mg Oral Daily   calcium  acetate  1,334 mg Oral TID WC   Chlorhexidine  Gluconate Cloth  6 each Topical Q0600   gabapentin   100 mg Oral QHS   guaiFENesin   600 mg Oral BID   heparin   5,000 Units Subcutaneous Q8H   heparin  sodium (porcine)  3,000 Units Intracatheter Once   hydrALAZINE   10 mg Oral Q8H   levothyroxine   50 mcg Oral Q0600   metoprolol  tartrate  50 mg Oral BID   mupirocin  ointment  1 Application Nasal BID   pantoprazole   40 mg Oral Daily   sodium chloride  flush  3 mL Intravenous Q12H   sodium chloride  flush  3 mL Intravenous Q12H    Dialysis Orders: MWF - [facility] 4hrs, BFR 400, DFR Auto 1.5,  EDW 69kg, 3K/ 2.5Ca Heparin  3000 unit bolus and 500 unit mid-run Mircera 150 mcg q2wks - last 9/29  Assessment/Plan: Non-healing  L foot amputation site - On IV ABXs. VVS following. S/p angio 10/8, TMA was scheduled for Friday but pt declined.  High risk of BKA in future. Appears patient has reconsidered and plan for TMA sometime today. ESRD - on HD MWF. Plan for HD per routine schedule.  Next HD 10/13-will work around today's procedure Hypertension/volume  - Euvolemic on exam. BP stable. Anemia of CKD - Last ESA dose 9/29, not due yet Secondary Hyperparathyroidism -  Corr Ca ok and phos low. Hold binders for now. Nutrition - Renal diet with fluid restriction  Charmaine Piety, NP [facility] 03/25/2024,4:01 PM  LOS: 6 days

## 2024-03-25 NOTE — Progress Notes (Signed)
 Orthopedic Tech Progress Note Patient Details:  [name] [name] April 08, 1961 [id number]  Went to fit patient with POST OP SHOE, and he stated with wife at bedside that he has a post op shoe like this  Patient ID: [name] [name], male   DOB: August 12, 1960, 63 y.o.   MRN: [id number]  [name] Pac 03/25/2024, 5:04 PM

## 2024-03-25 NOTE — Op Note (Signed)
 Full Operative Report  Date of Operation: 2:28 PM, 03/25/2024   Patient: [name] [name] - 63 y.o. male  Surgeon: Malvin Marsa FALCON, DPM   Assistant: None  Diagnosis: Osteomelitis of left foot, nonhealting surgical wound  Procedure:  1.  Transmetatarsal amputation of left foot 2.  Application of amniotic graft 5 x 5 single layer bio Vance, left foot    Anesthesia: Monitor Anesthesia Care  No responsible provider has been recorded for the case.  Anesthesiologist: Niels Marien CROME, MD CRNA: Dorma Jonny BROCKS, CRNA   Estimated Blood Loss: 20 mL  Hemostasis: 1) Anatomical dissection, mechanical compression, electrocautery 2) no tourniquet was used during the procedure  Implants: Implant Name Type Inv. Item Serial No. Manufacturer Lot No. LRB No. Used Action  ALLOGRAFT AMNI BIOVANCE 5X5 1L - [id number] Graft ALLOGRAFT AMNI BIOVANCE 5X5 1L  [employer] [id number] Left 1 Implanted    Materials: Prolene 2-0 and skin staples  Injectables: 1) Pre-operatively: 20 cc of 50:50 mixture 1%lidocaine  plain and 0.5% marcaine  plain 2) Post-operatively: None   Specimens: - Pathology: Left foot forefoot for pathology - Microbiology: Bone culture second metatarsal head   Antibiotics: IV antibiotics given per schedule on the floor  Drains: None  Complications: Patient tolerated the procedure well without complication.   Operative findings: As below in detailed report  Indications for Procedure: [name] [name] presents to Malvin Marsa FALCON, DPM with a chief complaint of nonhealing surgical wound status post prior amputation of the 2nd and 3rd toes at the MPJ level with concern for exposed 2nd and 3rd metatarsal heads with early osteonecrosis.  Also with PAD contributing to the dehiscence and nonhealing.  Has undergone revascularization angiogram per vascular surgery.  The patient has failed conservative treatments of various modalities. At this time the patient has elected to  proceed with surgical correction. All alternatives, risks, and complications of the procedures were thoroughly explained to the patient. Patient exhibits appropriate understanding of all discussion points and informed consent was signed and obtained in the chart with no guarantees to surgical outcome given or implied.  Description of Procedure: Patient was brought to the operating room. Patient remained on their hospital bed in the supine position. A surgical timeout was performed and all members of the operating room, the procedure, and the surgical site were identified. anesthesia occurred as per anesthesia record. Local anesthetic as previously described was then injected about the operative field in a local infiltrative block.  The operative lower extremity as noted above was then prepped and draped in the usual sterile manner. The following procedure then began.  Attention was directed to the LEFT lower extremity. A fish-mouth type incision was made proximal to the web spaces and encompassed the entire forefoot. The full-thickness incision was made with a longer plantar flap to allow for wound closure. The incision was continued through the soft tissue down to the shafts of the metatarsal bones. A 15 blade was then used to free up the periosteum on all the metatarsal shafts. Using an oscillating saw, the metatarsals were cut in a dorsal distal to plantar proximal orientation. The first metatarsal was beveled so that the medial cortex was shorter than the lateral, and the fifth metatarsal was beveled so that the lateral cortex was shorter than the medial, thus less prominent. The amputation was done so that a metatarsal parabola was maintained.  The distal portion including all the digits were freed from the metatarsals and soft tissue attachments. The specimen was passed off the  field and sent for gross pathology. All remaining non-viable and necrotic tissues were sharply resected and removed. Extensor and  flexors tendons were grasped with a hemostat and cut proximally. Good bleeding from the plantar and dorsal flaps was seen All distal portions of the metatarsals were than smoothed with a rasp. The surgical site was then flushed with copious amount of saline under power pulse lavage.   Next in order to promote healing at the amputation site given disadvantage pedal arterial flow decision made to apply amniotic graft.  A single layer bio Vance amniotic graft was placed on the plantar soft tissue flap prior to closure.  No graft was wasted total graft area 5 x 5 cm. The plantar flap was brought in approximation with the dorsal flap and the sutures material previously described was used for closure. Care was taken not to place the flaps under tension in order not to jeopardize the vascular supply.  The surgical site was then dressed with Xeroform 4 x 4 gauze Kerlix and Ace wrap. The patient tolerated both the procedure and anesthesia well with vital signs stable throughout. The patient was transferred in good condition and all vital signs stable  from the OR to recovery under the discretion of anesthesia.  Condition: Vital signs stable, neurovascular status unchanged from preoperative   Surgical plan:  Expect clean surgical margin at the amputation site and there was decent bleeding seen from the plantar and dorsal flap however he is still at risk for nonhealing given pedal disease.  Hopeful for healing maintain nonweightbearing strict to the left lower extremity.  Okay to resume any anticoagulation per vascular/primary.   Recommend 5 days p.o. antibiotics.   The patient will be nonweightbearing in a postop shoe to the operative limb until further instructed. The dressing is to remain clean, dry, and intact. Will continue to follow unless noted elsewhere.   Marsa Honour, DPM [facility]

## 2024-03-25 NOTE — Anesthesia Preprocedure Evaluation (Addendum)
 Anesthesia Evaluation  Patient identified by MRN, date of birth, ID band Patient awake    Reviewed: Allergy & Precautions, NPO status , Patient's Chart, lab work & pertinent test results, reviewed documented beta blocker date and time   Airway Mallampati: III  TM Distance: >3 FB Neck ROM: Full    Dental  (+) Dental Advisory Given, Poor Dentition, Chipped,    Pulmonary neg pulmonary ROS   Pulmonary exam normal breath sounds clear to auscultation       Cardiovascular hypertension, Pt. on medications and Pt. on home beta blockers pulmonary hypertension+ Peripheral Vascular Disease and +CHF  Normal cardiovascular exam Rhythm:Regular Rate:Normal  TTE 2025 1. Left ventricular ejection fraction, by estimation, is 50 to 55%. The  left ventricle has low normal function. The left ventricle has no regional  wall motion abnormalities. There is mild left ventricular hypertrophy.  Left ventricular diastolic  parameters are consistent with Grade II diastolic dysfunction  (pseudonormalization). Elevated left atrial pressure. The average left  ventricular global longitudinal strain is -19.2 %.   2. Right ventricular systolic function is normal. The right ventricular  size is normal. Tricuspid regurgitation signal is inadequate for assessing  PA pressure.   3. Left atrial size was mildly dilated.   4. The mitral valve is abnormal. Moderate leaflet thickening. Trivial  mitral valve regurgitation.   5. The aortic valve is tricuspid. Aortic valve regurgitation is not  visualized. Aortic valve sclerosis/calcification is present, without any  evidence of aortic stenosis.   6. The inferior vena cava is normal in size with greater than 50%  respiratory variability, suggesting right atrial pressure of 3 mmHg.     Neuro/Psych negative neurological ROS  negative psych ROS   GI/Hepatic Neg liver ROS,GERD  ,,  Endo/Other  diabetesHypothyroidism     Renal/GU Dialysis and ESRFRenal disease (dialysis MWF, last dialysis Friday)Lab Results      Component                Value               Date                      NA                       132 (L)             03/25/2024                CL                       93 (L)              03/25/2024                K                        4.2                 03/25/2024                CO2                      20 (L)              03/25/2024                BUN  21                  03/25/2024                CREATININE               6.68 (H)            03/25/2024                GFRNONAA                 9 (L)               03/25/2024                CALCIUM                   8.5 (L)             03/25/2024                PHOS                     4.0                 03/25/2024                ALBUMIN                   2.3 (L)             03/25/2024                GLUCOSE                  103 (H)             03/25/2024             negative genitourinary   Musculoskeletal  (+) Arthritis ,    Abdominal   Peds  Hematology  (+) Blood dyscrasia (plavix ), anemia Lab Results      Component                Value               Date                      WBC                      7.3                 03/23/2024                HGB                      9.4 (L)             03/23/2024                HCT                      29.2 (L)            03/23/2024                MCV                      93.3  03/23/2024                PLT                      152                 03/23/2024              Anesthesia Other Findings 63 y.o. male with medical history significant of hypertension, hyperlipidemia, diabetes mellitus type 2, ESRD on HD, hypothyroidism who presents with post-operative foot pain. 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 Patient noted that surgical site wound had opened up and he had redness present on the forefoot   Reproductive/Obstetrics                              Anesthesia Physical Anesthesia Plan  ASA: 3  Anesthesia Plan: MAC   Post-op Pain Management: Tylenol  PO (pre-op )*   Induction: Intravenous  PONV Risk Score and Plan: 1 and Propofol  infusion, Treatment may vary due to age or medical condition, Ondansetron  and Midazolam   Airway Management Planned: Natural Airway  Additional Equipment:   Intra-op Plan:   Post-operative Plan:   Informed Consent: I have reviewed the patients History and Physical, chart, labs and discussed the procedure including the risks, benefits and alternatives for the proposed anesthesia with the patient or authorized representative who has indicated his/her understanding and acceptance.     Dental advisory given  Plan Discussed with: CRNA  Anesthesia Plan Comments:          Anesthesia Quick Evaluation

## 2024-03-25 NOTE — Progress Notes (Signed)
 HD called to verify patient was back on unit post op TMA to be taken for HD treatment. Confirmed with patient while HD was on the phone that he was refusing HD today as he had mentioned this morning.

## 2024-03-25 NOTE — Progress Notes (Addendum)
 History and Physical Interval Note:  03/25/2024 12:55 PM  [name] [name]  has presented today for surgery, with the diagnosis of Left foot nonhealing amputation site at the 2nd and 3rd mpj, wit concern for underlying osteonecrosis and exposed bone 2nd and 3rd met head.  The various methods of treatment have been discussed with the patient and family. After consideration of risks, benefits and other options for treatment, the patient has consented to   Procedure(s) with comments: AMPUTATION, FOOT, RAY (Left) - Tranmetarsal amputation as a surgical intervention.  The patient's history has been reviewed, patient examined, no change in status, stable for surgery.  I have reviewed the patient's chart and labs.  Questions were answered to the patient's satisfaction.    - Again discussed the procedure in detail with the patient regarding the removal of the 1st, 4th and 5th toes.  Discussed the risk of nonhealing and if this occurs he may need a more proximal amputation.  This family seemed upset by this but I told him this is a risk regardless of whether surgery is performed or not and if he does not have surgery he will be at risk for worsening infection sepsis and death.  I offered them any opportunity to answer any questions they may have including the patient and his mother who are in the preop room.  They denied any questions.   Marsa FALCON Christe Tellez

## 2024-03-26 ENCOUNTER — Encounter (HOSPITAL_COMMUNITY): Payer: Self-pay | Admitting: Podiatry

## 2024-03-26 DIAGNOSIS — T8149XA Infection following a procedure, other surgical site, initial encounter: Secondary | ICD-10-CM | POA: Diagnosis not present

## 2024-03-26 LAB — RENAL FUNCTION PANEL
Albumin: 2.3 g/dL — ABNORMAL LOW (ref 3.5–5.0)
Anion gap: 16 — ABNORMAL HIGH (ref 5–15)
BUN: 12 mg/dL (ref 8–23)
CO2: 24 mmol/L (ref 22–32)
Calcium: 8.5 mg/dL — ABNORMAL LOW (ref 8.9–10.3)
Chloride: 92 mmol/L — ABNORMAL LOW (ref 98–111)
Creatinine, Ser: 4.05 mg/dL — ABNORMAL HIGH (ref 0.61–1.24)
GFR, Estimated: 16 mL/min — ABNORMAL LOW (ref >60)
Glucose, Bld: 140 mg/dL — ABNORMAL HIGH (ref 70–99)
Phosphorus: 3.1 mg/dL (ref 2.5–4.6)
Potassium: 4.2 mmol/L (ref 3.5–5.1)
Sodium: 132 mmol/L — ABNORMAL LOW (ref 135–145)

## 2024-03-26 MED ORDER — VANCOMYCIN HCL 500 MG/100ML IV SOLN: 500.0000 mg | INTRAVENOUS | Status: DC

## 2024-03-26 MED ORDER — AMOXICILLIN-POT CLAVULANATE 500-125 MG PO TABS
1.0000 | ORAL_TABLET | Freq: Two times a day (BID) | ORAL | Status: DC
  Administered 2024-03-26 – 2024-03-27 (×3): 1 via ORAL
  Filled 2024-03-26 (×3): qty 1

## 2024-03-26 MED ORDER — HEPARIN SODIUM (PORCINE) 1000 UNIT/ML IJ SOLN
INTRAMUSCULAR | Status: AC
  Filled 2024-03-26: qty 4

## 2024-03-26 MED ORDER — AMOXICILLIN-POT CLAVULANATE 875-125 MG PO TABS: 1.0000 | ORAL_TABLET | Freq: Two times a day (BID) | ORAL | Status: DC

## 2024-03-26 MED ORDER — CLOPIDOGREL BISULFATE 75 MG PO TABS
75.0000 mg | ORAL_TABLET | Freq: Every day | ORAL | Status: DC
  Administered 2024-03-26 – 2024-03-28 (×3): 75 mg via ORAL
  Filled 2024-03-26 (×3): qty 1

## 2024-03-26 MED ORDER — DARBEPOETIN ALFA 100 MCG/0.5ML IJ SOSY: 100.0000 ug | PREFILLED_SYRINGE | INTRAMUSCULAR | Status: DC

## 2024-03-26 MED ORDER — ASPIRIN 81 MG PO TBEC
81.0000 mg | DELAYED_RELEASE_TABLET | Freq: Every day | ORAL | Status: DC
  Administered 2024-03-26 – 2024-03-28 (×3): 81 mg via ORAL
  Filled 2024-03-26 (×3): qty 1

## 2024-03-26 NOTE — Progress Notes (Signed)
 Responded to consult for IV. X1 unsuccessful attempt. R arm restricted; no other appropriate veins found for peripheral access. 2nd assess requested; recommend considering central line due to limited veins.

## 2024-03-26 NOTE — Progress Notes (Addendum)
 VAST RN to patient's bedside to provide second assessment for IV access. Left arm visualized and assessed utilizing ultrasound, as well as right arm with old graft; no appropriate veins noted for cannulation (veins are small, bifurcate, too deep, or are non-compressible).  Attending, nephrologist, and unit nurses notified via SecureChat for need of alternate plan for administration of any needed IV medications.

## 2024-03-26 NOTE — Progress Notes (Addendum)
 [facility] Progress Note   Subjective:    Seen and examined patient at bedside. S/p TMA of left foot yesterday. Currently resting with no acute complaints. Tolerated HD overnight with net UF 2L. Given high HD census for tomorrow, his next HD will be 10/16.  Objective Vitals:   03/26/24 0440 03/26/24 0749 03/26/24 1158 03/26/24 1506  BP: (!) 151/61 (!) 166/106 (!) 151/69 (!) 159/69  Pulse: (!) 57 65 (!) 59 61  Resp: 20 17  19   Temp: 98.4 F (36.9 C) (!) 97 F (36.1 C) 98 F (36.7 C) (!) 97.1 F (36.2 C)  TempSrc: Oral Axillary Oral Axillary  SpO2: 97%  94% 97%  Weight: 66.2 kg     Height:       Physical Exam General: Awake, alert, on RA, NAD Heart: S1 and S2; No murmurs, gallops, or rubs Lungs: Clear anteriorly Abdomen: Soft and non-tender Extremities: No LE edema; L non-healing toe amputation site Dialysis Access: [facility]   Filed Weights   03/25/24 1216 03/26/24 0045 03/26/24 0440  Weight: 68.2 kg 68.2 kg 66.2 kg   No intake or output data in the 24 hours ending 03/26/24 1546  Additional Objective Labs: Basic Metabolic Panel: Recent Labs  Lab 03/24/24 0812 03/25/24 0411 03/26/24 0644  NA 136 132* 132*  K 4.0 4.2 4.2  CL 93* 93* 92*  CO2 22 20* 24  GLUCOSE 96 103* 140*  BUN 16 21 12   CREATININE 5.13* 6.68* 4.05*  CALCIUM  8.5* 8.5* 8.5*  PHOS 3.6 4.0 3.1   Liver Function Tests: Recent Labs  Lab 03/24/24 0812 03/25/24 0411 03/26/24 0644  ALBUMIN  2.4* 2.3* 2.3*   No results for input(s): LIPASE, AMYLASE in the last 168 hours. CBC: Recent Labs  Lab 03/20/24 0022 03/22/24 0317 03/23/24 0328  WBC 8.0 7.5 7.3  HGB 9.8* 9.5* 9.4*  HCT 30.9* 28.6* 29.2*  MCV 96.9 93.5 93.3  PLT 143* 139* 152   Blood Culture    Component Value Date/Time   SDES TISSUE 03/25/2024 1350   SPECREQUEST LEFT FOREFOOT 03/25/2024 1350   CULT  03/25/2024 1350    FEW STAPHYLOCOCCUS AUREUS SUSCEPTIBILITIES TO FOLLOW Performed at [facility],  [address], [place], [place] [address]    REPTSTATUS PENDING 03/25/2024 1350    Cardiac Enzymes: No results for input(s): CKTOTAL, CKMB, CKMBINDEX, TROPONINI in the last 168 hours. CBG: Recent Labs  Lab 03/23/24 1644 03/23/24 2113 03/24/24 0606 03/25/24 1224 03/25/24 1420  GLUCAP 135* 127* 94 82 79   Iron Studies: No results for input(s): IRON, TIBC, TRANSFERRIN, FERRITIN in the last 72 hours. Lab Results  Component Value Date   INR 1.1 05/19/2023   INR 1.1 03/06/2023   INR 1.1 08/30/2020   Studies/Results: DG Foot 2 Views Left Result Date: 03/25/2024 CLINICAL DATA:  [id number] Post-operative state [id number] EXAM: LEFT FOOT - 2 VIEW COMPARISON:  Radiographs 02/14/2024 and 12/05/2023.  MRI 03/17/2024. FINDINGS: Interval transmetatarsal amputation through all of the mid metatarsal shafts. The amputation margins are sharp. There is a small amount of soft tissue emphysema within the surgical bed. Distal skin staples are in place. There are prominent vascular calcifications. IMPRESSION: Interval transmetatarsal amputation as described. Electronically Signed   By: Elsie Perone M.D.   On: 03/25/2024 20:08    Medications:   amitriptyline   10 mg Oral QHS   amLODipine   10 mg Oral q morning   amoxicillin -clavulanate  1 tablet Oral Q12H   aspirin  EC  81 mg Oral  Daily   atorvastatin   40 mg Oral Daily   calcium  acetate  1,334 mg Oral TID WC   Chlorhexidine  Gluconate Cloth  6 each Topical Q0600   clopidogrel   75 mg Oral Daily   gabapentin   100 mg Oral QHS   guaiFENesin   600 mg Oral BID   haloperidol  lactate  2 mg Intramuscular Once   heparin   5,000 Units Subcutaneous Q8H   hydrALAZINE   10 mg Oral Q8H   levothyroxine   50 mcg Oral Q0600   metoprolol  tartrate  50 mg Oral BID   pantoprazole   40 mg Oral Daily   sodium chloride  flush  3 mL Intravenous Q12H   sodium chloride  flush  3 mL Intravenous Q12H    Dialysis Orders: MWF - [facility] 4hrs, BFR 400, DFR  Auto 1.5,  EDW 69kg, 3K/ 2.5Ca Heparin  3000 unit bolus and 500 unit mid-run Mircera 150 mcg q2wks - last 9/29  Assessment/Plan: Non-healing L foot amputation site - On IV ABXs. VVS following. S/p angio 10/8. S/p TMA 10/13. ESRD - on HD MWF. Given high HD census tomorrow, plan for his next HD on 10/16 if he's still here. Can also coordinate with his OP HD center for chair availability if he leaves tomorrow. Hypertension/volume  - Euvolemic on exam. BP stable. Anemia of CKD - Hgb 9.4. Last ESA dose 9/29, will resume ESA today. Secondary Hyperparathyroidism -  Corr Ca ok and phos low. Hold binders for now. Nutrition - Renal diet with fluid restriction  Charmaine Piety, NP [facility] 03/26/2024,3:46 PM  LOS: 7 days

## 2024-03-26 NOTE — Progress Notes (Signed)
 PROGRESS NOTE    [name] [name]  FMW:[id number] DOB: 1961/05/30 DOA: 03/19/2024 PCP: Kip Righter, MD   Brief Narrative: This 63 y.o. male with medical history significant of hypertension, hyperlipidemia, diabetes mellitus type 2, ESRD on HD, hypothyroidism who presents with post-operative foot pain. 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 Patient noted that surgical site wound had opened up and he had redness present on the forefoot. He followed up with Dr. Alona today and due to to his symptoms and was advised to come to the hospital for further evaluation and need of IV antibiotics.  Patient was admitted for postoperative wound infection.  Podiatry is consulted,  vascular surgery is consulted.  Patient is s/p  angiogram followed by hemodialysis.  Assessment & Plan:   Principal Problem:   Postoperative wound infection Active Problems:   ESRD on dialysis (HCC)   PVD (peripheral vascular disease)   Anemia of chronic disease   Thrombocytopenia   Essential hypertension   Controlled type 2 diabetes mellitus without complication, without long-term current use of insulin  (HCC)   Hypothyroidism   Hyperlipidemia associated with type 2 diabetes mellitus (HCC)   GERD (gastroesophageal reflux disease)   Pyogenic inflammation of bone (HCC)   Wound dehiscence, surgical   Gangrene of left foot (HCC)   Acute osteomyelitis of left ankle or foot (HCC)  Post-operative wound infection: Patient presented with increasing pain and erythema of his postoperative site. He recently underwent 2nd and 3rd left toe amputation due to gangrene on 02/14/2024 by Dr. Alona.   MRI noted full-thickness wound at the tip of the second metatarsal head. Patient had been started on doxycycline  outpatient. Blood cultures > No growth so far  ESR and CRP elevated. Continue empiric antibiotics ( vancomycin  and ampicillin ). Oxycodone  as needed for pain. Appreciate vascular surgery,  status post angiogram, Patient high risk for nonhealing even a TMA level, may ultimately require more proximal amputation if fails to heal. Podiatry consulted, underwent left transmetatarsal amputation. POD #1. Podiatry recommended 5 days of oral antibiotics, NWB in post OP shoe.    ESRD on HD: Continue hemodialysis as per nephrology.   Peripheral vascular disease: Held aspirin  and Plavix  in case of need of procedure. Patient s/p angiogram of left lower extremity 03/20/24. Aspirin  and Plavix  resumed.  Anemia of chronic disease: Chronic. Hemoglobin noted to be 10.2 which appears similar to prior. No obvious visible bleeding.  Continue to monitor   Thrombocytopenia: Continue to monitor. Appears at baseline.   Essential hypertension: Blood pressure elevated at 155/75. Continue metoprolol  and amlodipine .   Controlled diabetes mellitus type 2 without long-term use of insulin  Last hemoglobin A1c noted to be 5.1.  Patient is not on any diabetic medications at baseline. Continue renal and carb modified diet Continue gabapentin .   Hypothyroidism: Continue levothyroxine .   Hyperlipidemia: Continue atorvastatin  .   GERD: Continue PPI.  Hypokalemia/hypophosphatemia: Replaced.  Continue to monitor.  DVT prophylaxis: Heparin  sq Code Status: Full code Family Communication: No family at bed side. Disposition Plan:    Status is: Inpatient Remains inpatient appropriate because: Severity of illness.   Consultants:  Vascular surgery Podiatry Nephrology  Procedures:  Antimicrobials:  Anti-infectives (From admission, onward)    Start     Dose/Rate Route Frequency Ordered Stop   03/27/24 1800  vancomycin  (VANCOREADY) IVPB 500 mg/100 mL  Status:  Discontinued        500 mg 100 mL/hr over 60 Minutes Intravenous Every M-W-F (Hemodialysis)  03/26/24 1021 03/26/24 1026   03/27/24 1200  vancomycin  (VANCOCIN ) IVPB 1000 mg/200 mL premix  Status:  Discontinued        1,000 mg 200 mL/hr  over 60 Minutes Intravenous Every M-W-F (Hemodialysis) 03/23/24 1325 03/26/24 1021   03/26/24 1430  amoxicillin -clavulanate (AUGMENTIN ) 875-125 MG per tablet 1 tablet        1 tablet Oral Every 12 hours 03/26/24 1333 03/31/24 0959   03/20/24 1800  vancomycin  (VANCOCIN ) IVPB 1000 mg/200 mL premix  Status:  Discontinued        1,000 mg 200 mL/hr over 60 Minutes Intravenous Every M-W-F (Hemodialysis) 03/20/24 1114 03/23/24 1325   03/20/24 1700  vancomycin  (VANCOCIN ) IVPB 1000 mg/200 mL premix  Status:  Discontinued        1,000 mg 200 mL/hr over 60 Minutes Intravenous Every M-W-F (Hemodialysis) 03/19/24 1642 03/20/24 0821   03/20/24 1700  Ampicillin-Sulbactam (UNASYN) 3 g in sodium chloride  0.9 % 100 mL IVPB  Status:  Discontinued        3 g 200 mL/hr over 30 Minutes Intravenous Every 24 hours 03/19/24 1642 03/20/24 0824   03/20/24 1700  Ampicillin-Sulbactam (UNASYN) 3 g in sodium chloride  0.9 % 100 mL IVPB  Status:  Discontinued        3 g 200 mL/hr over 30 Minutes Intravenous Every 12 hours 03/20/24 0824 03/26/24 1026   03/20/24 0915  vancomycin  (VANCOREADY) IVPB 1500 mg/300 mL        1,500 mg 150 mL/hr over 120 Minutes Intravenous  Once 03/20/24 0821 03/20/24 1253   03/20/24 0821  vancomycin  variable dose per unstable renal function (pharmacist dosing)  Status:  Discontinued         Does not apply See admin instructions 03/20/24 0821 03/20/24 1114   03/19/24 1530  Ampicillin-Sulbactam (UNASYN) 3 g in sodium chloride  0.9 % 100 mL IVPB       Placed in And Linked Group   3 g 200 mL/hr over 30 Minutes Intravenous  Once 03/19/24 1516 03/20/24 0817   03/19/24 1530  vancomycin  (VANCOCIN ) IVPB 1000 mg/200 mL premix  Status:  Discontinued       Placed in And Linked Group   1,000 mg 200 mL/hr over 60 Minutes Intravenous  Once 03/19/24 1516 03/20/24 9178       Subjective: Patient was seen and examined at bedside. Overnight events noted. Patient is status post left TMA.  POD #1.  Patient  denies any pain.  Objective: Vitals:   03/26/24 0430 03/26/24 0440 03/26/24 0749 03/26/24 1158  BP: (!) 145/69 (!) 151/61 (!) 166/106 (!) 151/69  Pulse: (!) 50 (!) 57 65 (!) 59  Resp: 14 20 17    Temp:  98.4 F (36.9 C) (!) 97 F (36.1 C) 98 F (36.7 C)  TempSrc:  Oral Axillary Oral  SpO2: 95% 97%  94%  Weight:  66.2 kg    Height:        Intake/Output Summary (Last 24 hours) at 03/26/2024 1336 Last data filed at 03/25/2024 1413 Gross per 24 hour  Intake 106.55 ml  Output 5 ml  Net 101.55 ml    Filed Weights   03/25/24 1216 03/26/24 0045 03/26/24 0440  Weight: 68.2 kg 68.2 kg 66.2 kg    Examination:  General exam: Appears calm and comfortable, deconditioned, not in any acute distress. Respiratory system: CTA Bilaterally . Respiratory effort normal.  RR 14 Cardiovascular system: S1 & S2 heard, RRR. No JVD, murmurs, rubs, gallops or clicks.  Gastrointestinal system:  Abdomen is non distended, soft and non tender. Normal bowel sounds heard. Central nervous system: Alert and oriented X 3. No focal neurological deficits. Extremities: Status post left TMA, POD #1.  Left foot in dressing. Skin: No rashes, lesions or ulcers Psychiatry: Judgement and insight appear normal. Mood & affect appropriate.     Data Reviewed: I have personally reviewed following labs and imaging studies  CBC: Recent Labs  Lab 03/20/24 0022 03/22/24 0317 03/23/24 0328  WBC 8.0 7.5 7.3  HGB 9.8* 9.5* 9.4*  HCT 30.9* 28.6* 29.2*  MCV 96.9 93.5 93.3  PLT 143* 139* 152   Basic Metabolic Panel: Recent Labs  Lab 03/22/24 0317 03/22/24 0318 03/23/24 0328 03/24/24 0812 03/25/24 0411 03/26/24 0644  NA 131* 132* 133* 136 132* 132*  K 4.0 4.0 3.6 4.0 4.2 4.2  CL 92* 92* 94* 93* 93* 92*  CO2 24 22 26 22  20* 24  GLUCOSE 110* 110* 107* 96 103* 140*  BUN 17 16 8 16 21 12   CREATININE 5.15* 5.03* 3.33* 5.13* 6.68* 4.05*  CALCIUM  8.0* 8.0* 8.2* 8.5* 8.5* 8.5*  MG 1.7  --  1.7  --   --   --   PHOS  4.5 4.5 2.7 3.6 4.0 3.1   GFR: Estimated Creatinine Clearance: 17 mL/min (A) (by C-G formula based on SCr of 4.05 mg/dL (H)). Liver Function Tests: Recent Labs  Lab 03/21/24 0300 03/22/24 0318 03/24/24 0812 03/25/24 0411 03/26/24 0644  ALBUMIN  2.4* 2.3* 2.4* 2.3* 2.3*   No results for input(s): LIPASE, AMYLASE in the last 168 hours. No results for input(s): AMMONIA in the last 168 hours. Coagulation Profile: No results for input(s): INR, PROTIME in the last 168 hours. Cardiac Enzymes: No results for input(s): CKTOTAL, CKMB, CKMBINDEX, TROPONINI in the last 168 hours. BNP (last 3 results) No results for input(s): PROBNP in the last 8760 hours. HbA1C: No results for input(s): HGBA1C in the last 72 hours. CBG: Recent Labs  Lab 03/23/24 1644 03/23/24 2113 03/24/24 0606 03/25/24 1224 03/25/24 1420  GLUCAP 135* 127* 94 82 79   Lipid Profile: No results for input(s): CHOL, HDL, LDLCALC, TRIG, CHOLHDL, LDLDIRECT in the last 72 hours.  Thyroid  Function Tests: No results for input(s): TSH, T4TOTAL, FREET4, T3FREE, THYROIDAB in the last 72 hours. Anemia Panel: No results for input(s): VITAMINB12, FOLATE, FERRITIN, TIBC, IRON, RETICCTPCT in the last 72 hours. Sepsis Labs: No results for input(s): PROCALCITON, LATICACIDVEN in the last 168 hours.   Recent Results (from the past 240 hours)  Aerobic Culture w Gram Stain (superficial specimen)     Status: None   Collection Time: 03/17/24  7:59 PM   Specimen: Foot; Wound  Result Value Ref Range Status   Specimen Description   Final    FOOT Performed at [facility], [address], [place], [place] [address]    Special Requests   Final    NONE Performed at [facility], [address] [place], [place] [address]    Gram Stain   Final    RARE WBC PRESENT,BOTH PMN AND MONONUCLEAR MODERATE GRAM POSITIVE COCCI IN PAIRS IN CHAINS Performed at [facility], [address], [place], [place] [address]    Culture   Final    ABUNDANT METHICILLIN RESISTANT STAPHYLOCOCCUS AUREUS   Report Status 03/20/2024 FINAL  Final  Blood Cultures x 2 sites     Status: None   Collection Time: 03/19/24 12:43 PM   Specimen: BLOOD LEFT HAND  Result Value Ref  Range Status   Specimen Description BLOOD LEFT HAND  Final   Special Requests   Final    BOTTLES DRAWN AEROBIC AND ANAEROBIC Blood Culture results may not be optimal due to an inadequate volume of blood received in culture bottles   Culture   Final    NO GROWTH 5 DAYS Performed at [facility], [address], [place], [place] [address]    Report Status 03/24/2024 FINAL  Final  Blood Cultures x 2 sites     Status: None   Collection Time: 03/19/24  5:09 PM   Specimen: BLOOD  Result Value Ref Range Status   Specimen Description BLOOD LEFT ANTECUBITAL  Final   Special Requests   Final    BOTTLES DRAWN AEROBIC AND ANAEROBIC Blood Culture results may not be optimal due to an inadequate volume of blood received in culture bottles   Culture   Final    NO GROWTH 5 DAYS Performed at [facility], [address] [place], [place] [address]    Report Status 03/24/2024 FINAL  Final  SARS Coronavirus 2 by RT PCR (hospital order, performed in [facility] hospital lab) *cepheid single result test* Anterior Nasal Swab     Status: None   Collection Time: 03/19/24  6:22 PM   Specimen: Anterior Nasal Swab  Result Value Ref Range Status   SARS Coronavirus 2 by RT PCR NEGATIVE NEGATIVE Final    Comment: Performed at [facility], [address], [place], [place] [address]  Respiratory (~20 pathogens) panel by PCR     Status: None   Collection Time: 03/19/24  6:22 PM   Specimen: Nasopharyngeal Swab; Respiratory  Result Value Ref Range Status   Adenovirus NOT DETECTED NOT DETECTED Final   Coronavirus 229E NOT DETECTED NOT DETECTED Final    Comment: (NOTE) The Coronavirus on the Respiratory Panel, DOES NOT test  for the novel  Coronavirus (2019 nCoV)    Coronavirus HKU1 NOT DETECTED NOT DETECTED Final   Coronavirus NL63 NOT DETECTED NOT DETECTED Final   Coronavirus OC43 NOT DETECTED NOT DETECTED Final   Metapneumovirus NOT DETECTED NOT DETECTED Final   Rhinovirus / Enterovirus NOT DETECTED NOT DETECTED Final   Influenza A NOT DETECTED NOT DETECTED Final   Influenza B NOT DETECTED NOT DETECTED Final   Parainfluenza Virus 1 NOT DETECTED NOT DETECTED Final   Parainfluenza Virus 2 NOT DETECTED NOT DETECTED Final   Parainfluenza Virus 3 NOT DETECTED NOT DETECTED Final   Parainfluenza Virus 4 NOT DETECTED NOT DETECTED Final   Respiratory Syncytial Virus NOT DETECTED NOT DETECTED Final   Bordetella pertussis NOT DETECTED NOT DETECTED Final   Bordetella Parapertussis NOT DETECTED NOT DETECTED Final   Chlamydophila pneumoniae NOT DETECTED NOT DETECTED Final   Mycoplasma pneumoniae NOT DETECTED NOT DETECTED Final    Comment: Performed at [facility], [address], [place], [place] [address]  MRSA Next Gen by PCR, Nasal     Status: Abnormal   Collection Time: 03/20/24  8:21 PM   Specimen: Nasal Mucosa; Nasal Swab  Result Value Ref Range Status   MRSA by PCR Next Gen DETECTED (A) NOT DETECTED Final    Comment: CRITICAL RESULT CALLED TO, READ BACK BY AND VERIFIED WITH: RN SOTERO BUNDE [phone] [id number] BY J RAZZAK, MT (NOTE) The GeneXpert MRSA Assay (FDA approved for NASAL specimens only), is one component of a comprehensive MRSA colonization surveillance program. It is not intended to diagnose MRSA infection nor  to guide or monitor treatment for MRSA infections. Test performance is not FDA approved in patients less than 66 years old. Performed at [facility], [address], [place], [place] [address]   Aerobic/Anaerobic Culture w Gram Stain (surgical/deep wound)     Status: None (Preliminary result)   Collection Time: 03/25/24  1:50 PM   Specimen: Soft Tissue, Other  Result Value Ref  Range Status   Specimen Description TISSUE  Final   Special Requests LEFT FOREFOOT  Final   Gram Stain NO WBC SEEN NO ORGANISMS SEEN   Final   Culture   Final    FEW STAPHYLOCOCCUS AUREUS SUSCEPTIBILITIES TO FOLLOW Performed at [facility], [address], [place], [place] [address]    Report Status PENDING  Incomplete    Radiology Studies: DG Foot 2 Views Left Result Date: 03/25/2024 CLINICAL DATA:  [id number] Post-operative state 252351 EXAM: LEFT FOOT - 2 VIEW COMPARISON:  Radiographs 02/14/2024 and 12/05/2023.  MRI 03/17/2024. FINDINGS: Interval transmetatarsal amputation through all of the mid metatarsal shafts. The amputation margins are sharp. There is a small amount of soft tissue emphysema within the surgical bed. Distal skin staples are in place. There are prominent vascular calcifications. IMPRESSION: Interval transmetatarsal amputation as described. Electronically Signed   By: Elsie Perone M.D.   On: 03/25/2024 20:08    Scheduled Meds:  amitriptyline   10 mg Oral QHS   amLODipine   10 mg Oral q morning   amoxicillin -clavulanate  1 tablet Oral Q12H   aspirin  EC  81 mg Oral Daily   atorvastatin   40 mg Oral Daily   calcium  acetate  1,334 mg Oral TID WC   Chlorhexidine  Gluconate Cloth  6 each Topical Q0600   clopidogrel   75 mg Oral Daily   gabapentin   100 mg Oral QHS   guaiFENesin   600 mg Oral BID   haloperidol  lactate  2 mg Intramuscular Once   heparin   5,000 Units Subcutaneous Q8H   hydrALAZINE   10 mg Oral Q8H   levothyroxine   50 mcg Oral Q0600   metoprolol  tartrate  50 mg Oral BID   pantoprazole   40 mg Oral Daily   sodium chloride  flush  3 mL Intravenous Q12H   sodium chloride  flush  3 mL Intravenous Q12H   Continuous Infusions:     LOS: 7 days    Time spent: 35 mins   Darcel Dawley, MD [facility]   If 7PM-7AM, please contact night-coverage

## 2024-03-26 NOTE — Progress Notes (Signed)
 Received patient in bed to unit.  Alert and Oriented x's 3, disoriented to situation at times.  Informed consent signed and in chart.   TX duration: 3 hours  Patient tolerated well.  Transported back to the room  Alert, without acute distress.  Hand-off given to patient's nurse.   Access used: Dialysis Catheter Access issues: None  Total UF removed: 2000 Medication(s) given: None Post HD VS: T98.4-HR58-B/P151/61 Post HD weight: 66.2KG  Neville Seip, RN [facility]   03/26/24 0440  Vitals  Temp 98.4 F (36.9 C)  Temp Source Oral  BP (!) 151/61  BP Location Left Arm  BP Method Automatic  Patient Position (if appropriate) Lying  Pulse Rate (!) 57  Pulse Rate Source Monitor  ECG Heart Rate (!) 58  Resp 20  Weight 66.2 kg  Type of Weight Post-Dialysis  Oxygen Therapy  SpO2 97 %  O2 Device Room Air  Pulse Oximetry Type Continuous  During Treatment Monitoring  Blood Flow Rate (mL/min) 0 mL/min  Arterial Pressure (mmHg) 21.82 mmHg  Venous Pressure (mmHg) -18.38 mmHg  TMP (mmHg) 15.35 mmHg  Ultrafiltration Rate (mL/min) 888 mL/min  Dialysate Flow Rate (mL/min) 299 ml/min  Dialysate Potassium Concentration 3  Dialysate Calcium  Concentration 2.5  Duration of HD Treatment -hour(s) 3 hour(s)  Cumulative Fluid Removed (mL) per Treatment  2000.12  HD Safety Checks Performed Yes  Intra-Hemodialysis Comments Tolerated well;Tx completed  Hemodialysis Catheter Right Internal jugular Double lumen Permanent (Tunneled)  Placement Date/Time: 01/10/24 1548   Placed prior to admission: No  Serial / Lot #: [id number]  Expiration Date: 08/10/28  Time Out: Correct patient;Correct site;Correct procedure  Maximum sterile barrier precautions: Hand hygiene;Large sterile sheet;S...  Site Condition No complications  Blue Lumen Status Flushed;Antimicrobial dead end cap;Heparin  locked  Red Lumen Status Flushed;Antimicrobial dead end cap;Heparin  locked  Purple Lumen Status N/A   Catheter fill solution Heparin  1000 units/ml  Catheter fill volume (Arterial) 1.6 cc  Catheter fill volume (Venous) 1.6  Dressing Type Transparent  Dressing Status Antimicrobial disc/dressing in place;Clean, Dry, Intact  Interventions New dressing;Dressing changed  Drainage Description None  Dressing Change Due 04/02/24  Post treatment catheter status Capped and Clamped

## 2024-03-27 DIAGNOSIS — T8149XA Infection following a procedure, other surgical site, initial encounter: Secondary | ICD-10-CM | POA: Diagnosis not present

## 2024-03-27 DIAGNOSIS — N186 End stage renal disease: Secondary | ICD-10-CM | POA: Diagnosis not present

## 2024-03-27 DIAGNOSIS — E1169 Type 2 diabetes mellitus with other specified complication: Secondary | ICD-10-CM | POA: Diagnosis not present

## 2024-03-27 DIAGNOSIS — I739 Peripheral vascular disease, unspecified: Secondary | ICD-10-CM | POA: Diagnosis not present

## 2024-03-27 LAB — RENAL FUNCTION PANEL
Albumin: 2 g/dL — ABNORMAL LOW (ref 3.5–5.0)
Anion gap: 12 (ref 5–15)
BUN: 21 mg/dL (ref 8–23)
CO2: 26 mmol/L (ref 22–32)
Calcium: 8.4 mg/dL — ABNORMAL LOW (ref 8.9–10.3)
Chloride: 97 mmol/L — ABNORMAL LOW (ref 98–111)
Creatinine, Ser: 5.44 mg/dL — ABNORMAL HIGH (ref 0.61–1.24)
GFR, Estimated: 11 mL/min — ABNORMAL LOW (ref >60)
Glucose, Bld: 166 mg/dL — ABNORMAL HIGH (ref 70–99)
Phosphorus: 2.5 mg/dL (ref 2.5–4.6)
Potassium: 4 mmol/L (ref 3.5–5.1)
Sodium: 135 mmol/L (ref 135–145)

## 2024-03-27 LAB — MAGNESIUM: Magnesium: 2 mg/dL (ref 1.7–2.4)

## 2024-03-27 LAB — CBC
HCT: 28 % — ABNORMAL LOW (ref 39.0–52.0)
Hemoglobin: 8.9 g/dL — ABNORMAL LOW (ref 13.0–17.0)
MCH: 29.9 pg (ref 26.0–34.0)
MCHC: 31.8 g/dL (ref 30.0–36.0)
MCV: 94 fL (ref 80.0–100.0)
Platelets: 238 K/uL (ref 150–400)
RBC: 2.98 MIL/uL — ABNORMAL LOW (ref 4.22–5.81)
RDW: 13.8 % (ref 11.5–15.5)
WBC: 6.9 K/uL (ref 4.0–10.5)
nRBC: 0 % (ref 0.0–0.2)

## 2024-03-27 LAB — PHOSPHORUS: Phosphorus: 2.5 mg/dL (ref 2.5–4.6)

## 2024-03-27 LAB — SURGICAL PATHOLOGY

## 2024-03-27 MED ORDER — CHLORHEXIDINE GLUCONATE CLOTH 2 % EX PADS
6.0000 | MEDICATED_PAD | Freq: Every day | CUTANEOUS | Status: DC
  Administered 2024-03-28: 6 via TOPICAL

## 2024-03-27 MED ORDER — DOXYCYCLINE HYCLATE 100 MG PO TABS
100.0000 mg | ORAL_TABLET | Freq: Two times a day (BID) | ORAL | Status: DC
  Administered 2024-03-27 – 2024-03-28 (×3): 100 mg via ORAL
  Filled 2024-03-27 (×3): qty 1

## 2024-03-27 NOTE — Progress Notes (Addendum)
 [facility] Progress Note   Subjective:    Seen and examined patient at bedside. No issues or complaints. Given high HD census for today, his next HD will be 10/16. Current renal labs remain stable and he's remains on RA.   Objective Vitals:   03/26/24 1158 03/26/24 1506 03/27/24 0753 03/27/24 1207  BP: (!) 151/69 (!) 159/69 (!) 154/70 133/87  Pulse: (!) 59 61 (!) 56 (!) 54  Resp:  19 18 17   Temp: 98 F (36.7 C) (!) 97.1 F (36.2 C) (!) 97.4 F (36.3 C) 98.3 F (36.8 C)  TempSrc: Oral Axillary Axillary Oral  SpO2: 94% 97% 95% 92%  Weight:      Height:       Physical Exam General: Awake, alert, on RA, NAD Heart: S1 and S2; No murmurs, gallops, or rubs Lungs: Clear anteriorly Abdomen: Soft and non-tender Extremities: No LE edema; L non-healing toe amputation site Dialysis Access: [facility]   Filed Weights   03/25/24 1216 03/26/24 0045 03/26/24 0440  Weight: 68.2 kg 68.2 kg 66.2 kg   No intake or output data in the 24 hours ending 03/27/24 1257  Additional Objective Labs: Basic Metabolic Panel: Recent Labs  Lab 03/25/24 0411 03/26/24 0644 03/27/24 0306 03/27/24 0307  NA 132* 132*  --  135  K 4.2 4.2  --  4.0  CL 93* 92*  --  97*  CO2 20* 24  --  26  GLUCOSE 103* 140*  --  166*  BUN 21 12  --  21  CREATININE 6.68* 4.05*  --  5.44*  CALCIUM  8.5* 8.5*  --  8.4*  PHOS 4.0 3.1 2.5 2.5   Liver Function Tests: Recent Labs  Lab 03/25/24 0411 03/26/24 0644 03/27/24 0307  ALBUMIN  2.3* 2.3* 2.0*   No results for input(s): LIPASE, AMYLASE in the last 168 hours. CBC: Recent Labs  Lab 03/22/24 0317 03/23/24 0328 03/27/24 0306  WBC 7.5 7.3 6.9  HGB 9.5* 9.4* 8.9*  HCT 28.6* 29.2* 28.0*  MCV 93.5 93.3 94.0  PLT 139* 152 238   Blood Culture    Component Value Date/Time   SDES TISSUE 03/25/2024 1350   SPECREQUEST LEFT FOREFOOT 03/25/2024 1350   CULT FEW METHICILLIN RESISTANT STAPHYLOCOCCUS AUREUS 03/25/2024 1350   REPTSTATUS PENDING  03/25/2024 1350    Cardiac Enzymes: No results for input(s): CKTOTAL, CKMB, CKMBINDEX, TROPONINI in the last 168 hours. CBG: Recent Labs  Lab 03/23/24 1644 03/23/24 2113 03/24/24 0606 03/25/24 1224 03/25/24 1420  GLUCAP 135* 127* 94 82 79   Iron Studies: No results for input(s): IRON, TIBC, TRANSFERRIN, FERRITIN in the last 72 hours. Lab Results  Component Value Date   INR 1.1 05/19/2023   INR 1.1 03/06/2023   INR 1.1 08/30/2020   Studies/Results: DG Foot 2 Views Left Result Date: 03/25/2024 CLINICAL DATA:  [id number] Post-operative state 252351 EXAM: LEFT FOOT - 2 VIEW COMPARISON:  Radiographs 02/14/2024 and 12/05/2023.  MRI 03/17/2024. FINDINGS: Interval transmetatarsal amputation through all of the mid metatarsal shafts. The amputation margins are sharp. There is a small amount of soft tissue emphysema within the surgical bed. Distal skin staples are in place. There are prominent vascular calcifications. IMPRESSION: Interval transmetatarsal amputation as described. Electronically Signed   By: Elsie Perone M.D.   On: 03/25/2024 20:08    Medications:   amitriptyline   10 mg Oral QHS   amLODipine   10 mg Oral q morning   aspirin  EC  81 mg Oral Daily   atorvastatin   40 mg Oral Daily   calcium  acetate  1,334 mg Oral TID WC   Chlorhexidine  Gluconate Cloth  6 each Topical Q0600   clopidogrel   75 mg Oral Daily   [START ON 03/31/2024] darbepoetin (ARANESP ) injection - DIALYSIS  100 mcg Subcutaneous Q Sun-1800   doxycycline   100 mg Oral Q12H   gabapentin   100 mg Oral QHS   guaiFENesin   600 mg Oral BID   haloperidol  lactate  2 mg Intramuscular Once   heparin   5,000 Units Subcutaneous Q8H   hydrALAZINE   10 mg Oral Q8H   levothyroxine   50 mcg Oral Q0600   metoprolol  tartrate  50 mg Oral BID   pantoprazole   40 mg Oral Daily   sodium chloride  flush  3 mL Intravenous Q12H   sodium chloride  flush  3 mL Intravenous Q12H    Dialysis Orders: MWF - [facility] 4hrs, BFR 400, DFR Auto 1.5,  EDW 69kg, 3K/ 2.5Ca Heparin  3000 unit bolus and 500 unit mid-run Mircera 150 mcg q2wks - last 9/29  Assessment/Plan: Non-healing L foot amputation site - On IV ABXs. VVS following. S/p angio 10/8. S/p TMA 10/13. ESRD - on HD MWF. Given high HD census today, plan for his next HD on 10/16 if he's still here. Can also coordinate with his OP HD center for chair availability if he leaves today.  Hypertension/volume  - Euvolemic on exam. Under EDW here, lower EDW at dc. BP acceptable. Anemia of CKD - Hgb 8.9. Last ESA dose 9/29, will ESA resumed-per pharmacy protocol here,l 1st dose scheduled on 10/19. Okay to transfuse for Hgb < 7. Secondary Hyperparathyroidism -  Corr Ca 10-monitor. Phos is low. Stopped binders today. Nutrition - Renal diet with fluid restriction  Charmaine Piety, NP [facility] 03/27/2024,12:57 PM  LOS: 8 days

## 2024-03-27 NOTE — Progress Notes (Signed)
 PROGRESS NOTE  [name] [name] FMW:[id number] DOB: 1960-09-29   PCP: Kip Righter, MD  Patient is from: Home?  DOA: 03/19/2024 LOS: 8  Chief complaints Chief Complaint  Patient presents with   Wound Infection     Brief Narrative / Interim history:  63 y.o. male with medical history significant of hypertension, hyperlipidemia, diabetes mellitus type 2, ESRD on HD, hypothyroidism who presents with post-operative foot pain. 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 Patient noted that surgical site wound had opened up and he had redness present on the forefoot. He followed up with Dr. Alona today and due to to his symptoms and was advised to come to the hospital for further evaluation and need of IV antibiotics.  Patient was admitted for postoperative wound infection.  Podiatry, vascular surgery and nephrology consulted.  Patient underwent revascularization on 10/8 and left TMA on 10/13.  Medically stable for discharge after dialysis and therapy evaluation on 10/16.  Subjective: Seen and examined earlier this morning.  No major events overnight or this morning.  Sleepy but wakes to voice.  No complaints.  Objective: Vitals:   03/26/24 1506 03/27/24 0753 03/27/24 1207 03/27/24 1622  BP: (!) 159/69 (!) 154/70 133/87 (!) 146/73  Pulse: 61 (!) 56 (!) 54 (!) 101  Resp: 19 18 17 18   Temp: (!) 97.1 F (36.2 C) (!) 97.4 F (36.3 C) 98.3 F (36.8 C) 97.8 F (36.6 C)  TempSrc: Axillary Axillary Oral Oral  SpO2: 97% 95% 92% 91%  Weight:      Height:        Examination:  GENERAL: No apparent distress.  Nontoxic. HEENT: MMM.  Vision and hearing grossly intact.  NECK: Supple.  No apparent JVD.  RESP:  No IWOB.  Fair aeration bilaterally. CVS:  RRR. Heart sounds normal.  ABD/GI/GU: BS+. Abd soft, NTND.  MSK/EXT:  Moves extremities.  Ace wrap over left foot. SKIN: no apparent skin lesion or wound NEURO: AA.  Oriented  x 4 except date.  No  apparent focal neuro deficit. PSYCH: Calm. Normal affect.   Consultants:  Vascular surgery Podiatry Nephrology  Procedures: 10/8-left lower extremity revascularization 10/13-left TMA  Microbiology summarized: 10/7-COVID-19 and 20 pathogen RVP nonreactive 10/7-blood cultures NGTD 10/13-surgical culture with MRSA  Assessment and plan: Post-operative wound infection: Presented with increased pain and erythema after recent 2nd and 3rd toe amputation due to gangrene on 9/3.  MRI noted full-thickness wound at the tip of the second metatarsal head. Patient had been started on doxycycline  outpatient.  ESR and CRP elevated. -Initially treated with vancomycin  and ampicillin -S/p LLE revascularization on 10/8 by vascular surgery -S/p left TMA with clean margins by podiatry on 10/13 -Surgical tissue culture with MRSA. -Changed antibiotics to doxycycline  based on culture sensitivity. -Cleared for discharge by vascular surgery and podiatry -NWB in postop shoe per podiatry -PT/OT evaluation.   Peripheral vascular disease: -S/p angiogram of left lower extremity 03/20/24. -Aspirin  and Plavix  resumed.   ESRD on HD MWF: Not able to get HD today due to busy schedule. -HD off schedule on 10/16 before discharge   Anemia of chronic disease: Stable - Continue monitoring   Thrombocytopenia: Resolved   Essential hypertension: BP within acceptable range. -Continue metoprolol  and amlodipine .   Controlled IDDM-2: A1c 5.1%.   Hypothyroidism: -Continue levothyroxine .   Hyperlipidemia: -Continue atorvastatin  .   GERD: -Continue PPI.   Hypokalemia/hypophosphatemia: -Monitor replenish as appropriate  Body mass index is 20.94 kg/m.  DVT prophylaxis:  heparin  injection 5,000 Units Start: 03/19/24 1615  Code Status: Full code Family Communication: None at bedside Level of care: Progressive Cardiac Status is: Inpatient Remains inpatient appropriate because: Left foot  infection and PAD   Final disposition: To be determined   65 minutes with more than 50% spent in reviewing records, counseling patient/family and coordinating care.   Sch Meds:  Scheduled Meds:  amitriptyline   10 mg Oral QHS   amLODipine   10 mg Oral q morning   aspirin  EC  81 mg Oral Daily   atorvastatin   40 mg Oral Daily   [START ON 03/28/2024] Chlorhexidine  Gluconate Cloth  6 each Topical Q0600   clopidogrel   75 mg Oral Daily   [START ON 03/31/2024] darbepoetin (ARANESP ) injection - DIALYSIS  100 mcg Subcutaneous Q Sun-1800   doxycycline   100 mg Oral Q12H   gabapentin   100 mg Oral QHS   guaiFENesin   600 mg Oral BID   haloperidol  lactate  2 mg Intramuscular Once   heparin   5,000 Units Subcutaneous Q8H   hydrALAZINE   10 mg Oral Q8H   levothyroxine   50 mcg Oral Q0600   metoprolol  tartrate  50 mg Oral BID   pantoprazole   40 mg Oral Daily   sodium chloride  flush  3 mL Intravenous Q12H   sodium chloride  flush  3 mL Intravenous Q12H   Continuous Infusions: PRN Meds:.acetaminophen  **OR** acetaminophen , acetaminophen , albuterol , benzonatate , haloperidol  lactate **OR** haloperidol  lactate, hydrALAZINE , labetalol , ondansetron  **OR** ondansetron  (ZOFRAN ) IV, oxyCODONE -acetaminophen , senna-docusate, sodium chloride  flush  Antimicrobials: Anti-infectives (From admission, onward)    Start     Dose/Rate Route Frequency Ordered Stop   03/27/24 1800  vancomycin  (VANCOREADY) IVPB 500 mg/100 mL  Status:  Discontinued        500 mg 100 mL/hr over 60 Minutes Intravenous Every M-W-F (Hemodialysis) 03/26/24 1021 03/26/24 1026   03/27/24 1245  doxycycline  (VIBRA -TABS) tablet 100 mg        100 mg Oral Every 12 hours 03/27/24 1155 04/01/24 0959   03/27/24 1200  vancomycin  (VANCOCIN ) IVPB 1000 mg/200 mL premix  Status:  Discontinued        1,000 mg 200 mL/hr over 60 Minutes Intravenous Every M-W-F (Hemodialysis) 03/23/24 1325 03/26/24 1021   03/26/24 1445  amoxicillin -clavulanate (AUGMENTIN )  500-125 MG per tablet 1 tablet  Status:  Discontinued        1 tablet Oral Every 12 hours 03/26/24 1351 03/27/24 1155   03/26/24 1430  amoxicillin -clavulanate (AUGMENTIN ) 875-125 MG per tablet 1 tablet  Status:  Discontinued        1 tablet Oral Every 12 hours 03/26/24 1333 03/26/24 1351   03/20/24 1800  vancomycin  (VANCOCIN ) IVPB 1000 mg/200 mL premix  Status:  Discontinued        1,000 mg 200 mL/hr over 60 Minutes Intravenous Every M-W-F (Hemodialysis) 03/20/24 1114 03/23/24 1325   03/20/24 1700  vancomycin  (VANCOCIN ) IVPB 1000 mg/200 mL premix  Status:  Discontinued        1,000 mg 200 mL/hr over 60 Minutes Intravenous Every M-W-F (Hemodialysis) 03/19/24 1642 03/20/24 0821   03/20/24 1700  Ampicillin-Sulbactam (UNASYN) 3 g in sodium chloride  0.9 % 100 mL IVPB  Status:  Discontinued        3 g 200 mL/hr over 30 Minutes Intravenous Every 24 hours 03/19/24 1642 03/20/24 0824   03/20/24 1700  Ampicillin-Sulbactam (UNASYN) 3 g in sodium chloride  0.9 % 100 mL IVPB  Status:  Discontinued        3 g  200 mL/hr over 30 Minutes Intravenous Every 12 hours 03/20/24 0824 03/26/24 1026   03/20/24 0915  vancomycin  (VANCOREADY) IVPB 1500 mg/300 mL        1,500 mg 150 mL/hr over 120 Minutes Intravenous  Once 03/20/24 0821 03/20/24 1253   03/20/24 0821  vancomycin  variable dose per unstable renal function (pharmacist dosing)  Status:  Discontinued         Does not apply See admin instructions 03/20/24 0821 03/20/24 1114   03/19/24 1530  Ampicillin-Sulbactam (UNASYN) 3 g in sodium chloride  0.9 % 100 mL IVPB       Placed in And Linked Group   3 g 200 mL/hr over 30 Minutes Intravenous  Once 03/19/24 1516 03/20/24 0817   03/19/24 1530  vancomycin  (VANCOCIN ) IVPB 1000 mg/200 mL premix  Status:  Discontinued       Placed in And Linked Group   1,000 mg 200 mL/hr over 60 Minutes Intravenous  Once 03/19/24 1516 03/20/24 0821        I have personally reviewed the following labs and images: CBC: Recent  Labs  Lab 03/22/24 0317 03/23/24 0328 03/27/24 0306  WBC 7.5 7.3 6.9  HGB 9.5* 9.4* 8.9*  HCT 28.6* 29.2* 28.0*  MCV 93.5 93.3 94.0  PLT 139* 152 238   BMP &GFR Recent Labs  Lab 03/22/24 0317 03/22/24 0318 03/23/24 0328 03/24/24 0812 03/25/24 0411 03/26/24 0644 03/27/24 0306 03/27/24 0307  NA 131*   < > 133* 136 132* 132*  --  135  K 4.0   < > 3.6 4.0 4.2 4.2  --  4.0  CL 92*   < > 94* 93* 93* 92*  --  97*  CO2 24   < > 26 22 20* 24  --  26  GLUCOSE 110*   < > 107* 96 103* 140*  --  166*  BUN 17   < > 8 16 21 12   --  21  CREATININE 5.15*   < > 3.33* 5.13* 6.68* 4.05*  --  5.44*  CALCIUM  8.0*   < > 8.2* 8.5* 8.5* 8.5*  --  8.4*  MG 1.7  --  1.7  --   --   --  2.0  --   PHOS 4.5   < > 2.7 3.6 4.0 3.1 2.5 2.5   < > = values in this interval not displayed.   Estimated Creatinine Clearance: 12.7 mL/min (A) (by C-G formula based on SCr of 5.44 mg/dL (H)). Liver & Pancreas: Recent Labs  Lab 03/22/24 0318 03/24/24 0812 03/25/24 0411 03/26/24 0644 03/27/24 0307  ALBUMIN  2.3* 2.4* 2.3* 2.3* 2.0*   No results for input(s): LIPASE, AMYLASE in the last 168 hours. No results for input(s): AMMONIA in the last 168 hours. Diabetic: No results for input(s): HGBA1C in the last 72 hours. Recent Labs  Lab 03/23/24 1644 03/23/24 2113 03/24/24 0606 03/25/24 1224 03/25/24 1420  GLUCAP 135* 127* 94 82 79   Cardiac Enzymes: No results for input(s): CKTOTAL, CKMB, CKMBINDEX, TROPONINI in the last 168 hours. No results for input(s): PROBNP in the last 8760 hours. Coagulation Profile: No results for input(s): INR, PROTIME in the last 168 hours. Thyroid  Function Tests: No results for input(s): TSH, T4TOTAL, FREET4, T3FREE, THYROIDAB in the last 72 hours. Lipid Profile: No results for input(s): CHOL, HDL, LDLCALC, TRIG, CHOLHDL, LDLDIRECT in the last 72 hours. Anemia Panel: No results for input(s): VITAMINB12, FOLATE, FERRITIN,  TIBC, IRON, RETICCTPCT in the last 72 hours. Urine analysis:  Component Value Date/Time   COLORURINE AMBER (A) 03/06/2023 1036   APPEARANCEUR CLOUDY (A) 03/06/2023 1036   LABSPEC 1.019 03/06/2023 1036   PHURINE 5.0 03/06/2023 1036   GLUCOSEU NEGATIVE 03/06/2023 1036   HGBUR LARGE (A) 03/06/2023 1036   BILIRUBINUR NEGATIVE 03/06/2023 1036   KETONESUR NEGATIVE 03/06/2023 1036   PROTEINUR >=300 (A) 03/06/2023 1036   NITRITE NEGATIVE 03/06/2023 1036   LEUKOCYTESUR MODERATE (A) 03/06/2023 1036   Sepsis Labs: Invalid input(s): PROCALCITONIN, LACTICIDVEN  Microbiology: Recent Results (from the past 240 hours)  Aerobic Culture w Gram Stain (superficial specimen)     Status: None   Collection Time: 03/17/24  7:59 PM   Specimen: Foot; Wound  Result Value Ref Range Status   Specimen Description   Final    FOOT Performed at [facility], [address] [place], [place] [address]    Special Requests   Final    NONE Performed at [facility], [address] [place], [place] [address]    Gram Stain   Final    RARE WBC PRESENT,BOTH PMN AND MONONUCLEAR MODERATE GRAM POSITIVE COCCI IN PAIRS IN CHAINS Performed at [facility], [address] [place], [place] [address]    Culture   Final    ABUNDANT METHICILLIN RESISTANT STAPHYLOCOCCUS AUREUS   Report Status 03/20/2024 FINAL  Final  Blood Cultures x 2 sites     Status: None   Collection Time: 03/19/24 12:43 PM   Specimen: BLOOD LEFT HAND  Result Value Ref Range Status   Specimen Description BLOOD LEFT HAND  Final   Special Requests   Final    BOTTLES DRAWN AEROBIC AND ANAEROBIC Blood Culture results may not be optimal due to an inadequate volume of blood received in culture bottles   Culture   Final    NO GROWTH 5 DAYS Performed at [facility], [address], [place], [place] [address]    Report Status 03/24/2024 FINAL  Final  Blood Cultures x 2 sites     Status: None   Collection Time: 03/19/24  5:09 PM    Specimen: BLOOD  Result Value Ref Range Status   Specimen Description BLOOD LEFT ANTECUBITAL  Final   Special Requests   Final    BOTTLES DRAWN AEROBIC AND ANAEROBIC Blood Culture results may not be optimal due to an inadequate volume of blood received in culture bottles   Culture   Final    NO GROWTH 5 DAYS Performed at [facility], [address], [place], [place] [address]    Report Status 03/24/2024 FINAL  Final  SARS Coronavirus 2 by RT PCR (hospital order, performed in [facility] hospital lab) *cepheid single result test* Anterior Nasal Swab     Status: None   Collection Time: 03/19/24  6:22 PM   Specimen: Anterior Nasal Swab  Result Value Ref Range Status   SARS Coronavirus 2 by RT PCR NEGATIVE NEGATIVE Final    Comment: Performed at [facility], [address], [place], [place] [address]  Respiratory (~20 pathogens) panel by PCR     Status: None   Collection Time: 03/19/24  6:22 PM   Specimen: Nasopharyngeal Swab; Respiratory  Result Value Ref Range Status   Adenovirus NOT DETECTED NOT DETECTED Final   Coronavirus 229E NOT DETECTED NOT DETECTED Final    Comment: (NOTE) The Coronavirus on the Respiratory Panel, DOES NOT test for the novel  Coronavirus (2019 nCoV)    Coronavirus HKU1 NOT DETECTED  NOT DETECTED Final   Coronavirus NL63 NOT DETECTED NOT DETECTED Final   Coronavirus OC43 NOT DETECTED NOT DETECTED Final   Metapneumovirus NOT DETECTED NOT DETECTED Final   Rhinovirus / Enterovirus NOT DETECTED NOT DETECTED Final   Influenza A NOT DETECTED NOT DETECTED Final   Influenza B NOT DETECTED NOT DETECTED Final   Parainfluenza Virus 1 NOT DETECTED NOT DETECTED Final   Parainfluenza Virus 2 NOT DETECTED NOT DETECTED Final   Parainfluenza Virus 3 NOT DETECTED NOT DETECTED Final   Parainfluenza Virus 4 NOT DETECTED NOT DETECTED Final   Respiratory Syncytial Virus NOT DETECTED NOT DETECTED Final   Bordetella pertussis NOT DETECTED NOT DETECTED Final    Bordetella Parapertussis NOT DETECTED NOT DETECTED Final   Chlamydophila pneumoniae NOT DETECTED NOT DETECTED Final   Mycoplasma pneumoniae NOT DETECTED NOT DETECTED Final    Comment: Performed at [facility], [address], [place], [place] [address]  MRSA Next Gen by PCR, Nasal     Status: Abnormal   Collection Time: 03/20/24  8:21 PM   Specimen: Nasal Mucosa; Nasal Swab  Result Value Ref Range Status   MRSA by PCR Next Gen DETECTED (A) NOT DETECTED Final    Comment: CRITICAL RESULT CALLED TO, READ BACK BY AND VERIFIED WITH: RN SOTERO BUNDE [id number] [id number] BY J RAZZAK, [place] (NOTE) The GeneXpert MRSA Assay (FDA approved for NASAL specimens only), is one component of a comprehensive MRSA colonization surveillance program. It is not intended to diagnose MRSA infection nor to guide or monitor treatment for MRSA infections. Test performance is not FDA approved in patients less than 14 years old. Performed at [facility], [address] [place], [place] [address]   Aerobic/Anaerobic Culture w Gram Stain (surgical/deep wound)     Status: None (Preliminary result)   Collection Time: 03/25/24  1:50 PM   Specimen: Soft Tissue, Other  Result Value Ref Range Status   Specimen Description TISSUE  Final   Special Requests LEFT FOREFOOT  Final   Gram Stain   Final    NO WBC SEEN NO ORGANISMS SEEN Performed at [facility], [address] [place], [place] [address]    Culture   Final    FEW METHICILLIN RESISTANT STAPHYLOCOCCUS AUREUS NO ANAEROBES ISOLATED; CULTURE IN PROGRESS FOR 5 DAYS    Report Status PENDING  Incomplete   Organism ID, Bacteria METHICILLIN RESISTANT STAPHYLOCOCCUS AUREUS  Final      Susceptibility   Methicillin resistant staphylococcus aureus - MIC*    CIPROFLOXACIN >=8 RESISTANT Resistant     ERYTHROMYCIN >=8 RESISTANT Resistant     GENTAMICIN <=0.5 SENSITIVE Sensitive     OXACILLIN >=4 RESISTANT Resistant     TETRACYCLINE <=1 SENSITIVE Sensitive      VANCOMYCIN  <=0.5 SENSITIVE Sensitive     TRIMETH/SULFA >=320 RESISTANT Resistant     CLINDAMYCIN <=0.25 SENSITIVE Sensitive     RIFAMPIN <=0.5 SENSITIVE Sensitive     Inducible Clindamycin NEGATIVE Sensitive     LINEZOLID 2 SENSITIVE Sensitive     * FEW METHICILLIN RESISTANT STAPHYLOCOCCUS AUREUS    Radiology Studies: No results found.    Artesha Wemhoff T. Alyx Mcguirk [facility]  If 7PM-7AM, please contact night-coverage [email or web address] 03/27/2024, 5:07 PM

## 2024-03-27 NOTE — Progress Notes (Signed)
  Subjective:  Patient ID: [name] [name], male    DOB: Dec 13, 1960,  MRN: [id number]  Chief Complaint  Patient presents with   Wound Infection    DOS: 03/25/24 Procedure: 1.  Transmetatarsal amputation of left foot 2.  Application of amniotic graft 5 x 5 single layer bio Vance, left foot  63 y.o. male seen for post op check. He denies much pain. He states he does not want to go to rehab plans to go home. Discussed follow up plans and to leave dressing intact him and his mother stated agreement.   Review of Systems: Negative except as noted in the HPI. Denies N/V/F/Ch.   Objective:   Constitutional Well developed. Well nourished.  Vascular Foot warm and well perfused. Capillary refill normal to all digits.   No calf pain with palpation  Neurologic Normal speech. Oriented to person, place, and time. Epicritic sensation diminished to left foot  Dermatologic Amputation site healing well, well coapted no drainage or dehiscence. No erythema or evidence residual infection   Orthopedic: S/p L foot TMA   Radiographs: Interval transmetatarsal amputation as described.   Pathology: pending  Micro: L foot tissue at 2nd met head: MRSA  Assessment:   1. 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  POD # 2 s/p L foot TMA with graft application -Progressing as expected post op, amputation site healing well without dehiscence/ necrosis/infection -XR: expected changes -WB Status: NWB in post op shoe to LLE -Sutures: remain intact. -Medications/ABX: Recommend 5 days doxycyline upon discharge -Dressing: Replaced new xerform 4x4 kerlix and ace wrap dressing, leave this clean dry intact until folllow up - F/u Plan: Patient is stable for DC from my standpoint, will follow up with me next week Thursday office  to arrange. Will sign off.         Marolyn JULIANNA Honour, DPM [facility] / [facility]

## 2024-03-28 DIAGNOSIS — T8131XD Disruption of external operation (surgical) wound, not elsewhere classified, subsequent encounter: Secondary | ICD-10-CM | POA: Diagnosis not present

## 2024-03-28 DIAGNOSIS — N186 End stage renal disease: Secondary | ICD-10-CM | POA: Diagnosis not present

## 2024-03-28 DIAGNOSIS — I739 Peripheral vascular disease, unspecified: Secondary | ICD-10-CM | POA: Diagnosis not present

## 2024-03-28 DIAGNOSIS — T8149XA Infection following a procedure, other surgical site, initial encounter: Secondary | ICD-10-CM | POA: Diagnosis not present

## 2024-03-28 LAB — RENAL FUNCTION PANEL
Albumin: 2.1 g/dL — ABNORMAL LOW (ref 3.5–5.0)
Anion gap: 10 (ref 5–15)
BUN: 26 mg/dL — ABNORMAL HIGH (ref 8–23)
CO2: 28 mmol/L (ref 22–32)
Calcium: 8.5 mg/dL — ABNORMAL LOW (ref 8.9–10.3)
Chloride: 98 mmol/L (ref 98–111)
Creatinine, Ser: 7.14 mg/dL — ABNORMAL HIGH (ref 0.61–1.24)
GFR, Estimated: 8 mL/min — ABNORMAL LOW (ref >60)
Glucose, Bld: 143 mg/dL — ABNORMAL HIGH (ref 70–99)
Phosphorus: 2.7 mg/dL (ref 2.5–4.6)
Potassium: 4.1 mmol/L (ref 3.5–5.1)
Sodium: 136 mmol/L (ref 135–145)

## 2024-03-28 MED ORDER — DOXYCYCLINE HYCLATE 100 MG PO TABS: 100.0000 mg | ORAL_TABLET | Freq: Two times a day (BID) | ORAL | 0 refills | Status: AC

## 2024-03-28 MED ORDER — ATORVASTATIN CALCIUM 40 MG PO TABS: 40.0000 mg | ORAL_TABLET | Freq: Every day | ORAL | 0 refills | Status: AC

## 2024-03-28 MED ORDER — RENA-VITE PO TABS: 1.0000 | ORAL_TABLET | Freq: Every day | ORAL | Status: DC

## 2024-03-28 MED ORDER — JUVEN PO PACK: 1.0000 | PACK | Freq: Two times a day (BID) | ORAL | Status: DC

## 2024-03-28 NOTE — Evaluation (Addendum)
 Occupational Therapy Evaluation Patient Details Name: [name] [name] MRN: [id number] DOB: 04/10/1961 Today's Date: 03/28/2024   History of Present Illness   Pt is a 63 y.o male admitted 10/7 for post-op foot pain. 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 S/p LLE angiogram 10/8. S/p L TMA.  PMH: HTN, HLD, DM, ESRD, hypothyroidism     Clinical Impressions Pt admitted based on above, and was seen based on problem list below. PTA pt was living with his mom who was assisting with ADLs and w/c transfers. Today pt is requiring set up  to total assist for ADLs. Pt requiring max assist to complete squat pivot transfers to/from [facility]. Despite frequent multimodal cues, and attempts to reposition, pt continues to apply wt through LLE, educated pt on importance of maintaining NWB status for proper wound healing. Given pt's performance today recommending <3 hours of skilled rehab daily. Educated pt on level of assistance required but reporting my mom can do it, and adamantly declining post-acute rehab. If pt continues to decline recommending pt d/c with [facility] services. Pt would benefit from drop arm BSC to ease caregiver burden and promote safety with squat pivot transfers. OT will continue to follow acutely to maximize functional independence.     If plan is discharge home, recommend the following:   A lot of help with walking and/or transfers;A lot of help with bathing/dressing/bathroom;Assistance with cooking/housework;Help with stairs or ramp for entrance     Functional Status Assessment   Patient has had a recent decline in their functional status and demonstrates the ability to make significant improvements in function in a reasonable and predictable amount of time.     Equipment Recommendations   None recommended by OT     Recommendations for Other Services   PT consult     Precautions/Restrictions   Precautions Precautions: Fall Recall of Precautions/Restrictions:  Intact Restrictions Weight Bearing Restrictions Per Provider Order: No     Mobility Bed Mobility Overal bed mobility: Needs Assistance Bed Mobility: Supine to Sit, Sit to Supine     Supine to sit: Mod assist, HOB elevated, Used rails Sit to supine: Supervision   General bed mobility comments: Mod assist for trunk, pt able to return self to supine no assist    Transfers Overall transfer level: Needs assistance Equipment used: None Transfers: Bed to chair/wheelchair/BSC     Squat pivot transfers: Max assist       General transfer comment: Cues for hand placement and sequencing, pt not following any verbal or tactile cues to maintain NWB during transfer      Balance Overall balance assessment: Needs assistance Sitting-balance support: Bilateral upper extremity supported, Feet supported Sitting balance-Leahy Scale: Poor Sitting balance - Comments: Pt with posterior lean sitting EOB x1 LOB unable to self correct, assist from therapist to facilitate anterior lean       ADL either performed or assessed with clinical judgement   ADL Overall ADL's : Needs assistance/impaired Eating/Feeding: Set up;Sitting   Grooming: Set up;Sitting   Upper Body Bathing: Set up;Sitting   Lower Body Bathing: Maximal assistance;Sitting/lateral leans   Upper Body Dressing : Set up;Sitting   Lower Body Dressing: Maximal assistance;Sitting/lateral leans   Toilet Transfer: Squat-pivot;Maximal assistance;BSC/3in1 Toilet Transfer Details (indicate cue type and reason): Cues for sequencing, not following cues to avoid WB Toileting- Clothing Manipulation and Hygiene: Total assistance;Sitting/lateral lean       Functional mobility during ADLs: Maximal assistance General ADL Comments: Pt with poor strength and balance to  complete ADLs     Vision Baseline Vision/History: 1 Wears glasses Patient Visual Report: No change from baseline Vision Assessment?: No apparent visual deficits             Pertinent Vitals/Pain Pain Assessment Pain Assessment: Faces Faces Pain Scale: Hurts even more Pain Location: L foot Pain Descriptors / Indicators: Discomfort, Grimacing Pain Intervention(s): Monitored during session     Extremity/Trunk Assessment Upper Extremity Assessment Upper Extremity Assessment: Generalized weakness   Lower Extremity Assessment Lower Extremity Assessment: Defer to PT evaluation   Cervical / Trunk Assessment Cervical / Trunk Assessment: Kyphotic   Communication Communication Communication: No apparent difficulties   Cognition Arousal: Alert Behavior During Therapy: Agitated Cognition: Cognition impaired     Awareness: Online awareness impaired Memory impairment (select all impairments): Short-term memory, Working Biochemist, clinical functioning impairment (select all impairments): Problem solving, Reasoning OT - Cognition Comments: Pt with poor stm, aware of deficits, but poor insight into level of assistance needed     Following commands: Impaired Following commands impaired: Follows multi-step commands inconsistently     Cueing  General Comments   Cueing Techniques: Verbal cues  VSS on RA           Home Living Family/patient expects to be discharged to:: Private residence Living Arrangements: Parent Available Help at Discharge: Family;Available 24 hours/day Type of Home: House Home Access: Ramped entrance     Home Layout: One level     Bathroom Shower/Tub: Sponge bathes at baseline   Bathroom Toilet: Standard Bathroom Accessibility: Yes   Home Equipment: Shower seat;Cane - single point;Rolling Walker (2 wheels);Wheelchair - manual;Wheelchair - power;Grab bars - toilet;BSC/3in1          Prior Functioning/Environment Prior Level of Function : Needs assist     Mobility Comments: Uses w/c primarily reports I just get off the bed and in the wheelchair, most likely squat pivot. Says use of RW makes me fall ADLs  Comments: Assist from family with all ADLs at w/c level    OT Problem List: Decreased strength;Decreased range of motion;Impaired balance (sitting and/or standing);Decreased activity tolerance;Decreased cognition;Decreased safety awareness;Decreased knowledge of use of DME or AE;Decreased knowledge of precautions;Cardiopulmonary status limiting activity   OT Treatment/Interventions: Self-care/ADL training;Therapeutic exercise;Energy conservation;DME and/or AE instruction;Therapeutic activities;Patient/family education;Balance training      OT Goals(Current goals can be found in the care plan section)   Acute Rehab OT Goals Patient Stated Goal: To be left alone OT Goal Formulation: With patient Time For Goal Achievement: 04/11/24 Potential to Achieve Goals: Fair   OT Frequency:  Min 2X/week       AM-PAC OT 6 Clicks Daily Activity     Outcome Measure Help from another person eating meals?: None Help from another person taking care of personal grooming?: A Little Help from another person toileting, which includes using toliet, bedpan, or urinal?: Total Help from another person bathing (including washing, rinsing, drying)?: A Lot Help from another person to put on and taking off regular upper body clothing?: A Little Help from another person to put on and taking off regular lower body clothing?: A Lot 6 Click Score: 15   End of Session Equipment Utilized During Treatment: Gait belt Nurse Communication: Mobility status  Activity Tolerance: Patient tolerated treatment well Patient left: in bed;with call bell/phone within reach;with bed alarm set  OT Visit Diagnosis: Unsteadiness on feet (R26.81);Other abnormalities of gait and mobility (R26.89);Muscle weakness (generalized) (M62.81);History of falling (Z91.81)  Time: 8957-8940 OT Time Calculation (min): 17 min Charges:  OT General Charges $OT Visit: 1 Visit OT Evaluation $OT Eval Moderate Complexity: 1  Mod  Adrianne BROCKS, OT  Acute Rehabilitation Services Office [phone] Secure chat preferred   Adrianne GORMAN Savers 03/28/2024, 11:21 AM

## 2024-03-28 NOTE — Discharge Summary (Signed)
 Physician Discharge Summary  RODNEY YERA FMW:[id number] DOB: [birth year] DOA: 03/19/2024  PCP: Kip Righter, MD  Admit date: 03/19/2024 Discharge date: 03/28/24  Admitted From: Home Disposition: Home Recommendations for Outpatient Follow-up:  Outpatient follow-up with PCP in 1 to 2 weeks Podiatry to arrange outpatient follow-up Please follow up on the following pending results: None  Home Health: PT/OT Equipment/Devices: Patient has appropriate DME's  Discharge Condition: Stable CODE STATUS: Full code Diet Orders (From admission, onward)     Start     Ordered   03/22/24 0901  Diet regular Room service appropriate? Yes; Fluid consistency: Thin  Diet effective now       Question Answer Comment  Room service appropriate? Yes   Fluid consistency: Thin      03/22/24 0900             Contact information for follow-up providers     Kip Righter, MD. Schedule an appointment as soon as possible for a visit in 1 week(s).   Specialty: Family Medicine Contact information: [address] [place] [address] [phone]              Contact information for after-discharge care     Home Medical Care     [facility]) Follow up.   Why: HHPT/OT arranged- they will contact you to schedule Contact information: [address] [place]  [address] [phone]                     Hospital course 63 y.o. male with medical history significant of hypertension, hyperlipidemia, diabetes mellitus type 2, ESRD on HD, hypothyroidism who presents with post-operative foot pain. 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 Patient noted that surgical site wound had opened up and he had redness present on the forefoot. He followed up with Dr. Alona today and due to to his symptoms and was advised to come to the hospital for further evaluation and need of  IV antibiotics.  Patient was admitted for postoperative wound infection.  Podiatry, vascular surgery and nephrology consulted.   Patient underwent revascularization on 10/8 and left TMA on 10/13.  Surgical tissue culture with MRSA but clean surgical margin with amputation per podiatry.  Antibiotics changed to doxycycline  based on sensitivity.  Cleared for discharge by podiatry.   Therapy recommended SNF but patient refused.  Patient's mother at bedside and states that we cannot force him.  He did not have good experience when he went to [place].  Patient has capacity to make his own medical decision although he might not be making a good decision.  He is discharged with home health.  He has appropriate DME's at home.  Outpatient follow-up with podiatry as above.  Patient did not have dialysis on 10/15 due to schedule issue.  Nephrology recommended continuing outpatient dialysis on 10/17 since there is no emergent need based on his labs and clinicals.  See individual problem list below for more.   Problems addressed during this hospitalization Post-operative wound infection: Presented with increased pain and erythema after recent 2nd and 3rd toe amputation due to gangrene on 9/3.  MRI noted full-thickness wound at the tip of the second metatarsal head. Patient had been started on doxycycline  outpatient.  ESR and CRP elevated. -Initially treated with vancomycin  and ampicillin -S/p LLE revascularization on 10/8 by vascular surgery -S/p left TMA with clean margins by podiatry  on 10/13 -Surgical tissue culture with MRSA. -Changed antibiotics to doxycycline  based on culture sensitivity. -Cleared for discharge by vascular surgery and podiatry -NWB in postop shoe per podiatry.  Unfortunately, patient is not compliant - HHPT/OT.  Patient has appropriate DME's.   Peripheral vascular disease: -S/p angiogram of left lower extremity 03/20/24. -Aspirin  and Plavix  resumed.    ESRD on HD MWF: Missed HD  in house on 10/15. -Patient can wait until his outpatient HD on 10/17 per nephrology   Anemia of chronic disease: Stable - Recheck CBC in 1 week   Thrombocytopenia: Resolved   Essential hypertension: BP within acceptable range. -Continue metoprolol  and amlodipine .   Controlled IDDM-2: A1c 5.1%.   Hypothyroidism: -Continue levothyroxine .   Hyperlipidemia: -Continue atorvastatin  .   GERD: -Continue PPI.   Hypokalemia/hypophosphatemia: Resolved  Increase nutrient needs Body mass index is 20.94 kg/m. Nutrition Problem: Increased nutrient needs Etiology: post-op healing Signs/Symptoms: estimated needs Interventions: MVI, Nepro shake, Liberalize Diet     Consultations: Vascular surgery Podiatry Nephrology  Time spent 35  minutes  Vital signs Vitals:   03/28/24 0354 03/28/24 0524 03/28/24 0736 03/28/24 1104  BP: (!) 165/79 (!) 150/74 (!) 163/76 (!) 165/66  Pulse: 71 64 74 77  Temp: 97.8 F (36.6 C)  98 F (36.7 C) 98.7 F (37.1 C)  Resp: 20   17  Height:      Weight:      SpO2: 96% 99% 96% 100%  TempSrc: Oral  Oral Oral  BMI (Calculated):         Discharge exam  GENERAL: No apparent distress.  Nontoxic. HEENT: MMM.  Vision and hearing grossly intact.  NECK: Supple.  No apparent JVD.  RESP:  No IWOB.  Fair aeration bilaterally. CVS:  RRR. Heart sounds normal.  ABD/GI/GU: BS+. Abd soft, NTND.  MSK/EXT:  Moves extremities.  Ace wrap over left foot.  Significant muscle mass and subcu fat loss. SKIN: no apparent skin lesion or wound NEURO: AA.  Oriented  x 4.   No apparent focal neuro deficit. PSYCH: Calm. Normal affect.   Discharge Instructions Discharge Instructions     Discharge instructions   Complete by: As directed    It has been a pleasure taking care of you!  You were hospitalized due to left foot infection for which you have been treated surgically.  We are discharging you on antibiotics to complete treatment course for infection.  Follow  instruction by your podiatry about your surgical foot.    Take care,   Discharge wound care:   Complete by: As directed    Maintain dressing until outpatient follow-up with your podiatrist.   Increase activity slowly   Complete by: As directed       Allergies as of 03/28/2024   No Known Allergies      Medication List     STOP taking these medications    AZITHROMYCIN  PO   CORICIDIN HBP PO   doxycycline  100 MG capsule Commonly known as: VIBRAMYCIN  Replaced by: doxycycline  100 MG tablet   HYDROcodone -acetaminophen  5-325 MG tablet Commonly known as: NORCO/VICODIN       TAKE these medications    Accu-Chek Softclix Lancets lancets   ACETAMINOPHEN  PO Take 650 mg by mouth every 8 (eight) hours as needed for moderate pain (pain score 4-6).   amitriptyline  10 MG tablet Commonly known as: ELAVIL  Take 10 mg by mouth at bedtime.   amLODipine  10 MG tablet Commonly known as: NORVASC  Take 1 tablet (10 mg total) by  mouth every morning.   aspirin  81 MG chewable tablet Commonly known as: Aspirin  Childrens Chew 1 tablet (81 mg total) by mouth daily.   atorvastatin  40 MG tablet Commonly known as: LIPITOR Take 1 tablet (40 mg total) by mouth daily. What changed:  medication strength how much to take when to take this   benzonatate  200 MG capsule Commonly known as: TESSALON  Take 200 mg by mouth 3 (three) times daily as needed for cough.   calcium  acetate 667 MG capsule Commonly known as: PHOSLO Take 1,334 mg by mouth 3 (three) times daily with meals.   clopidogrel  75 MG tablet Commonly known as: Plavix  Take 1 tablet (75 mg total) by mouth daily.   doxycycline  100 MG tablet Commonly known as: VIBRA -TABS Take 1 tablet (100 mg total) by mouth every 12 (twelve) hours for 4 days. Replaces: doxycycline  100 MG capsule   gabapentin  100 MG capsule Commonly known as: NEURONTIN  Take 100 mg by mouth at bedtime.   hydrALAZINE  10 MG tablet Commonly known as:  APRESOLINE  Take 1 tablet (10 mg total) by mouth every 8 (eight) hours.   levothyroxine  50 MCG tablet Commonly known as: SYNTHROID  Take 50 mcg by mouth daily before breakfast.   metoprolol  tartrate 50 MG tablet Commonly known as: LOPRESSOR  Take 1 tablet (50 mg total) by mouth 2 (two) times daily.   multivitamin Tabs tablet Take 1 tablet by mouth at bedtime.   mupirocin  ointment 2 % Commonly known as: BACTROBAN  Apply 1 Application topically 2 (two) times daily.   oxyCODONE -acetaminophen  5-325 MG tablet Commonly known as: PERCOCET/ROXICET Take 1 tablet by mouth every 6 (six) hours as needed for severe pain (pain score 7-10).   pantoprazole  40 MG tablet Commonly known as: PROTONIX  Take 1 tablet (40 mg total) by mouth daily. 30 minutes before breakfast   PROBIOTIC DAILY PO Take 1 capsule by mouth at bedtime.   senna-docusate 8.6-50 MG tablet Commonly known as: Senokot-S Take 1 tablet by mouth daily as needed for mild constipation.   VITAMIN B12 PO Take 1 tablet by mouth at bedtime.               Discharge Care Instructions  (From admission, onward)           Start     Ordered   03/28/24 0000  Discharge wound care:       Comments: Maintain dressing until outpatient follow-up with your podiatrist.   03/28/24 1249             Procedures/Studies: 10/8-left lower extremity revascularization 10/13-left TMA   DG Foot 2 Views Left Result Date: 03/25/2024 CLINICAL DATA:  [id number] Post-operative state 252351 EXAM: LEFT FOOT - 2 VIEW COMPARISON:  Radiographs 02/14/2024 and 12/05/2023.  MRI 03/17/2024. FINDINGS: Interval transmetatarsal amputation through all of the mid metatarsal shafts. The amputation margins are sharp. There is a small amount of soft tissue emphysema within the surgical bed. Distal skin staples are in place. There are prominent vascular calcifications. IMPRESSION: Interval transmetatarsal amputation as described. Electronically Signed   By:  Elsie Perone M.D.   On: 03/25/2024 20:08   PERIPHERAL VASCULAR CATHETERIZATION Result Date: 03/20/2024 Patient name: LULA MICHAUX MRN: [id number] DOB: 03-07-1961 Sex: male 03/20/2024 Pre-operative Diagnosis: Left lower extremity critical limb ischemia with tissue loss, nonhealing toe amputation site Post-operative diagnosis:  Same Surgeon:  Fonda FORBES Rim, MD Procedure Performed: 1.  Ultrasound-guided micropuncture access of the right common femoral artery in retrograde fashion 2.  Aortogram 3.  Second-order cannulation,  left lower extremity angiogram 4.  Third cannulation, left lower extremity angiogram 5.  Moderate sedation time 12 minutes, contrast volume 45 mL Indications: Patient is a 63 year old male with known left lower extremity critical and ischemia with tissue loss.  He underwent tibial intervention a month ago, but has had poor wound healing at the toe amputation site.  He presents today for repeat angiography in an effort to improve distal perfusion for wound healing. Findings: Sluggish flow indicative of heart failure Aortogram: Widely patent infrarenal aorta with no flow-limiting stenosis in the aortoiliac segments bilaterally On the left: Widely patent common femoral artery, profunda, superficial femoral artery, popliteal artery. Severe calcific disease of the tibial vessels with two-vessel runoff, anterior tibial artery, posterior tibial artery.  Severe small vessel disease in the foot.  Procedure:  The patient was identified in the holding area and taken to room 8.  The patient was then placed supine on the table and prepped and draped in the usual sterile fashion.  A time out was called.  Ultrasound was used to evaluate the right common femoral artery.  It was patent .  A digital ultrasound image was acquired.  A micropuncture needle was used to access the right common femoral artery under ultrasound guidance.  An 018 wire was advanced without resistance and a micropuncture sheath was  placed.  The 018 wire was removed and a benson wire was placed.  The micropuncture sheath was exchanged for a 5 french sheath.  An omniflush catheter was advanced over the wire to the level of L-1.  An abdominal angiogram was obtained.  Next, using the omniflush catheter and a benson wire, the aortic bifurcation was crossed and the catheter was placed into theleft external iliac artery and left runoff was obtained.  I then moved the catheter into the superficial femoral artery for lower extremity angiography due to issues with contrast resolution due to severe heart failure.  Impression: Severe heart failure, severe small vessel disease.  Prior occluded anterior tibial artery is patent.  Patient has been maximally revascularized.  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 Fonda FORBES Rim MD [facility] Office: [phone]   VAS US  ABI WITH/WO TBI Result Date: 03/19/2024  LOWER EXTREMITY DOPPLER STUDY Patient Name:  TOMA ARTS  Date of Exam:   03/19/2024 Medical Rec #: [id number]        Accession #:    [id number] Date of Birth: 05-22-1961        Patient Gender: M Patient Age:   63 years Exam Location:  [facility] Procedure:      VAS US  ABI WITH/WO TBI Referring Phys: [facility] --------------------------------------------------------------------------------  Indications: non-healing amputation site (2nd and 3rd toes of LLE) High Risk Factors: Hypertension, hyperlipidemia, Diabetes, no history of                    smoking. Other Factors: Left 2nd and 3rd toe amputation on 02/19/2024, ESRD (HD).  Vascular Interventions: Left anterior tibial artery laser atherectomy, shockwave                         lithotripsy, and balloon angioplasty on 01/30/2024. Performing Technologist: Leigh Rom RVT, RDMS   Examination Guidelines: A complete evaluation includes at minimum, Doppler waveform signals and systolic blood pressure reading at  the level of bilateral brachial, anterior tibial, and posterior tibial arteries, when vessel segments are accessible. Bilateral testing is considered an integral part of a complete examination. Photoelectric Plethysmograph (PPG) waveforms and toe systolic pressure readings are included as required and additional duplex testing as needed. Limited examinations for reoccurring indications may be performed as noted.  ABI Findings: +---------+------------------+-----+-------------------+----------------+ Right    Rt Pressure (mmHg)IndexWaveform           Comment          +---------+------------------+-----+-------------------+----------------+ PTA                             dampened monophasicnon compressible +---------+------------------+-----+-------------------+----------------+ DP                              monophasic         non compressible +---------+------------------+-----+-------------------+----------------+ Great Toe0                 0.00 Absent                              +---------+------------------+-----+-------------------+----------------+ +---------+------------------+-----+----------+----------------+ Left     Lt Pressure (mmHg)IndexWaveform  Comment          +---------+------------------+-----+----------+----------------+ Brachial 165                    biphasic                   +---------+------------------+-----+----------+----------------+ PTA                             monophasicnon compressible +---------+------------------+-----+----------+----------------+ DP                              monophasicnon compressible +---------+------------------+-----+----------+----------------+ Great Toe0                 0.00 Absent                     +---------+------------------+-----+----------+----------------+  +-------+-----------+-----------+------------+------------+ ABI/TBIToday's ABIToday's TBIPrevious ABIPrevious TBI +-------+-----------+-----------+------------+------------+ Right  [place]         absent     [place]          absent       +-------+-----------+-----------+------------+------------+ Left   [place]         absent               absent       +-------+-----------+-----------+------------+------------+  Bilateral ABIs and TBIs appear essentially unchanged.  Summary: Right: Resting right ankle-brachial index indicates noncompressible right lower extremity arteries. The right toe-brachial index is absent.  Left: Resting left ankle-brachial index indicates noncompressible left lower extremity arteries. The left toe-brachial index is absent.  *See table(s) above for measurements and observations.  Electronically signed by Penne Colorado MD on 03/19/2024 at 6:35:59 PM.    Final    VAS US  LOWER EXTREMITY ARTERIAL DUPLEX Result Date: 03/19/2024 LOWER EXTREMITY ARTERIAL DUPLEX STUDY Patient Name:  HARON BEILKE  Date of Exam:   03/19/2024 Medical Rec #: [id number]        Accession #:    [id number] Date of Birth: 1961-04-25        Patient Gender: M Patient Age:   56 years Exam [facility]  Procedure:      VAS US  LOWER EXTREMITY ARTERIAL DUPLEX Referring Phys: [facility] --------------------------------------------------------------------------------  Indications: Non healing amputation site (LLE). High Risk Factors: Hypertension, hyperlipidemia, Diabetes, no history of                    smoking. Other Factors: Left 2nd and 3rd toe amputation on 02/19/2024, ESRD (HD).  Vascular Interventions: Left anterior tibial artery laser atherectomy, shockwave                         lithotripsy, and balloon angioplasty on 01/30/2024. Current ABI:            non compressible ABI absent TBI, bilaterally Comparison Study: No previous Performing Technologist: Jody Hill RVT, RDMS  Examination Guidelines: A  complete evaluation includes B-mode imaging, spectral Doppler, color Doppler, and power Doppler as needed of all accessible portions of each vessel. Bilateral testing is considered an integral part of a complete examination. Limited examinations for reoccurring indications may be performed as noted.   +----------+--------+-----+--------+----------+--------+ LEFT      PSV cm/sRatioStenosisWaveform  Comments +----------+--------+-----+--------+----------+--------+ CFA Prox  61                   biphasic           +----------+--------+-----+--------+----------+--------+ CFA Distal52                   triphasic          +----------+--------+-----+--------+----------+--------+ DFA       44                   biphasic           +----------+--------+-----+--------+----------+--------+ SFA Prox  57                   biphasic           +----------+--------+-----+--------+----------+--------+ SFA Mid   48                   biphasic           +----------+--------+-----+--------+----------+--------+ SFA Distal42                   biphasic           +----------+--------+-----+--------+----------+--------+ POP Prox  33                   biphasic           +----------+--------+-----+--------+----------+--------+ POP Distal35                   biphasic           +----------+--------+-----+--------+----------+--------+ TP Trunk  56                   biphasic           +----------+--------+-----+--------+----------+--------+ ATA Prox  49                   biphasic           +----------+--------+-----+--------+----------+--------+ ATA Mid   101                  biphasic           +----------+--------+-----+--------+----------+--------+ ATA Distal27                   biphasic           +----------+--------+-----+--------+----------+--------+ PTA  Prox  22                   biphasic            +----------+--------+-----+--------+----------+--------+ PTA Mid   44                   biphasic           +----------+--------+-----+--------+----------+--------+ PTA Distal27                   monophasic         +----------+--------+-----+--------+----------+--------+ PERO Prox 18                   biphasic           +----------+--------+-----+--------+----------+--------+ PERO Mid  36                   biphasic           +----------+--------+-----+--------+----------+--------+ DP        32                   monophasic         +----------+--------+-----+--------+----------+--------+  Summary: Left: Arterial wall calcifications throughout lower extremity. No evidence of significant stenosis seen on this exam.  See table(s) above for measurements and observations. Electronically signed by Penne Colorado MD on 03/19/2024 at 6:35:20 PM.    Final    MR FOOT LEFT W WO CONTRAST Result Date: 03/18/2024 MR FOOT WITHOUT THEN WITH IV CONTRAST COMPARISON: None. CLINICAL HISTORY: Reason amputation of the second and third toes with foot pain. PULSE SEQUENCES: Ax T1, Ax T2 FS, Sag T1, Sag T2 FS, Cor STIR, Cor T1 with and without IV contrast. FINDINGS: Examination is somewhat limited secondary to motion. 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 Moderate degenerative changes are seen in the fourth and fifth toes. Second and third toes have been amputated. There is a full-thickness wound at the level of the second metatarsal head with near full-thickness wound at the level of the third metatarsal head. The wound at the second metatarsal head appears to communicate with the bone. There is no destructive change or significant osteomyelitis involving the second metatarsal head at the current time. Patient is at significant increased risk due to exposure versus near exposure of the second metatarsal head. There is mild myositis. No significant tenosynovitis or tendinosis  present. No drainable collection. IMPRESSION: Full-thickness wound at the tip of the second metatarsal head with either exposure or near exposure of the second metatarsal head. Patient is at risk for developing osteomyelitis. No definitive osteomyelitis is identified at the current time. There is also wound adjacent to the third metatarsal head without full-thickness. Close follow-up suggested. Mild cellulitis without evidence of a drainable collection. Electronically signed by: Norleen Satchel MD 03/18/2024 09:13 AM EDT RP Workstation: [id number]       The results of significant diagnostics from this hospitalization (including imaging, microbiology, ancillary and laboratory) are listed below for reference.     Microbiology: Recent Results (from the past 240 hours)  Blood Cultures x 2 sites     Status: None   Collection Time: 03/19/24 12:43 PM   Specimen: BLOOD LEFT HAND  Result Value Ref Range Status   Specimen Description BLOOD LEFT HAND  Final   Special Requests   Final    BOTTLES DRAWN AEROBIC AND ANAEROBIC Blood Culture results may not be optimal due to an inadequate volume of blood received  in culture bottles   Culture   Final    NO GROWTH 5 DAYS Performed at [facility], [address], [place], [place] [address]    Report Status 03/24/2024 FINAL  Final  Blood Cultures x 2 sites     Status: None   Collection Time: 03/19/24  5:09 PM   Specimen: BLOOD  Result Value Ref Range Status   Specimen Description BLOOD LEFT ANTECUBITAL  Final   Special Requests   Final    BOTTLES DRAWN AEROBIC AND ANAEROBIC Blood Culture results may not be optimal due to an inadequate volume of blood received in culture bottles   Culture   Final    NO GROWTH 5 DAYS Performed at [facility], [address] [place], [place] [address]    Report Status 03/24/2024 FINAL  Final  SARS Coronavirus 2 by RT PCR (hospital order, performed in [facility] hospital lab) *cepheid single result test*  Anterior Nasal Swab     Status: None   Collection Time: 03/19/24  6:22 PM   Specimen: Anterior Nasal Swab  Result Value Ref Range Status   SARS Coronavirus 2 by RT PCR NEGATIVE NEGATIVE Final    Comment: Performed at [facility], [address] [place], [place] [address]  Respiratory (~20 pathogens) panel by PCR     Status: None   Collection Time: 03/19/24  6:22 PM   Specimen: Nasopharyngeal Swab; Respiratory  Result Value Ref Range Status   Adenovirus NOT DETECTED NOT DETECTED Final   Coronavirus 229E NOT DETECTED NOT DETECTED Final    Comment: (NOTE) The Coronavirus on the Respiratory Panel, DOES NOT test for the novel  Coronavirus (2019 nCoV)    Coronavirus HKU1 NOT DETECTED NOT DETECTED Final   Coronavirus NL63 NOT DETECTED NOT DETECTED Final   Coronavirus OC43 NOT DETECTED NOT DETECTED Final   Metapneumovirus NOT DETECTED NOT DETECTED Final   Rhinovirus / Enterovirus NOT DETECTED NOT DETECTED Final   Influenza A NOT DETECTED NOT DETECTED Final   Influenza B NOT DETECTED NOT DETECTED Final   Parainfluenza Virus 1 NOT DETECTED NOT DETECTED Final   Parainfluenza Virus 2 NOT DETECTED NOT DETECTED Final   Parainfluenza Virus 3 NOT DETECTED NOT DETECTED Final   Parainfluenza Virus 4 NOT DETECTED NOT DETECTED Final   Respiratory Syncytial Virus NOT DETECTED NOT DETECTED Final   Bordetella pertussis NOT DETECTED NOT DETECTED Final   Bordetella Parapertussis NOT DETECTED NOT DETECTED Final   Chlamydophila pneumoniae NOT DETECTED NOT DETECTED Final   Mycoplasma pneumoniae NOT DETECTED NOT DETECTED Final    Comment: Performed at [facility], [address], [place], [place] [address]  MRSA Next Gen by PCR, Nasal     Status: Abnormal   Collection Time: 03/20/24  8:21 PM   Specimen: Nasal Mucosa; Nasal Swab  Result Value Ref Range Status   MRSA by PCR Next Gen DETECTED (A) NOT DETECTED Final    Comment: CRITICAL RESULT CALLED TO, READ BACK BY AND VERIFIED WITH: RN SOTERO BUNDE [phone] [id number] BY J RAZZAK, MT (NOTE) The GeneXpert MRSA Assay (FDA approved for NASAL specimens only), is one component of a comprehensive MRSA colonization surveillance program. It is not intended to diagnose MRSA infection nor to guide or monitor treatment for MRSA infections. Test performance is not FDA approved in patients less than 40 years old. Performed at [facility], [address], [place], [place] [address]   Aerobic/Anaerobic Culture w Gram  Stain (surgical/deep wound)     Status: None (Preliminary result)   Collection Time: 03/25/24  1:50 PM   Specimen: Soft Tissue, Other  Result Value Ref Range Status   Specimen Description TISSUE  Final   Special Requests LEFT FOREFOOT  Final   Gram Stain   Final    NO WBC SEEN NO ORGANISMS SEEN Performed at [facility], [address] [place], [place] [address]    Culture   Final    FEW METHICILLIN RESISTANT STAPHYLOCOCCUS AUREUS NO ANAEROBES ISOLATED; CULTURE IN PROGRESS FOR 5 DAYS    Report Status PENDING  Incomplete   Organism ID, Bacteria METHICILLIN RESISTANT STAPHYLOCOCCUS AUREUS  Final      Susceptibility   Methicillin resistant staphylococcus aureus - MIC*    CIPROFLOXACIN >=8 RESISTANT Resistant     ERYTHROMYCIN >=8 RESISTANT Resistant     GENTAMICIN <=0.5 SENSITIVE Sensitive     OXACILLIN >=4 RESISTANT Resistant     TETRACYCLINE <=1 SENSITIVE Sensitive     VANCOMYCIN  <=0.5 SENSITIVE Sensitive     TRIMETH/SULFA >=320 RESISTANT Resistant     CLINDAMYCIN <=0.25 SENSITIVE Sensitive     RIFAMPIN <=0.5 SENSITIVE Sensitive     Inducible Clindamycin NEGATIVE Sensitive     LINEZOLID 2 SENSITIVE Sensitive     * FEW METHICILLIN RESISTANT STAPHYLOCOCCUS AUREUS     Labs:  CBC: Recent Labs  Lab 03/22/24 0317 03/23/24 0328 03/27/24 0306  WBC 7.5 7.3 6.9  HGB 9.5* 9.4* 8.9*  HCT 28.6* 29.2* 28.0*  MCV 93.5 93.3 94.0  PLT 139* 152 238   BMP &GFR Recent Labs  Lab 03/22/24 0317 03/22/24 0318  03/23/24 0328 03/24/24 0812 03/25/24 0411 03/26/24 0644 03/27/24 0306 03/27/24 0307 03/28/24 0316  NA 131*   < > 133* 136 132* 132*  --  135 136  K 4.0   < > 3.6 4.0 4.2 4.2  --  4.0 4.1  CL 92*   < > 94* 93* 93* 92*  --  97* 98  CO2 24   < > 26 22 20* 24  --  26 28  GLUCOSE 110*   < > 107* 96 103* 140*  --  166* 143*  BUN 17   < > 8 16 21 12   --  21 26*  CREATININE 5.15*   < > 3.33* 5.13* 6.68* 4.05*  --  5.44* 7.14*  CALCIUM  8.0*   < > 8.2* 8.5* 8.5* 8.5*  --  8.4* 8.5*  MG 1.7  --  1.7  --   --   --  2.0  --   --   PHOS 4.5   < > 2.7 3.6 4.0 3.1 2.5 2.5 2.7   < > = values in this interval not displayed.   Estimated Creatinine Clearance: 9.7 mL/min (A) (by C-G formula based on SCr of 7.14 mg/dL (H)). Liver & Pancreas: Recent Labs  Lab 03/24/24 0812 03/25/24 0411 03/26/24 0644 03/27/24 0307 03/28/24 0316  ALBUMIN  2.4* 2.3* 2.3* 2.0* 2.1*   No results for input(s): LIPASE, AMYLASE in the last 168 hours. No results for input(s): AMMONIA in the last 168 hours. Diabetic: No results for input(s): HGBA1C in the last 72 hours. Recent Labs  Lab 03/23/24 1644 03/23/24 2113 03/24/24 0606 03/25/24 1224 03/25/24 1420  GLUCAP 135* 127* 94 82 79   Cardiac Enzymes: No results for input(s): CKTOTAL, CKMB, CKMBINDEX, TROPONINI in the last 168 hours. No results for input(s): PROBNP in the last 8760 hours. Coagulation Profile:  No results for input(s): INR, PROTIME in the last 168 hours. Thyroid  Function Tests: No results for input(s): TSH, T4TOTAL, FREET4, T3FREE, THYROIDAB in the last 72 hours. Lipid Profile: No results for input(s): CHOL, HDL, LDLCALC, TRIG, CHOLHDL, LDLDIRECT in the last 72 hours. Anemia Panel: No results for input(s): VITAMINB12, FOLATE, FERRITIN, TIBC, IRON, RETICCTPCT in the last 72 hours. Urine analysis:    Component Value Date/Time   COLORURINE AMBER (A) 03/06/2023 1036   APPEARANCEUR CLOUDY (A)  03/06/2023 1036   LABSPEC 1.019 03/06/2023 1036   PHURINE 5.0 03/06/2023 1036   GLUCOSEU NEGATIVE 03/06/2023 1036   HGBUR LARGE (A) 03/06/2023 1036   BILIRUBINUR NEGATIVE 03/06/2023 1036   KETONESUR NEGATIVE 03/06/2023 1036   PROTEINUR >=300 (A) 03/06/2023 1036   NITRITE NEGATIVE 03/06/2023 1036   LEUKOCYTESUR MODERATE (A) 03/06/2023 1036   Sepsis Labs: Invalid input(s): PROCALCITONIN, LACTICIDVEN   SIGNED:  Josie Burleigh T Renessa Wellnitz, MD  [facility] 03/28/2024, 4:26 PM

## 2024-03-28 NOTE — Progress Notes (Signed)
 [facility] Progress Note   Subjective:    Patient's HD schedule was adjusted this week 2nd to high HD census for MWF group. He refused HD for today. Seen and examined patient at bedside. He remains on RA and volume/labs are stable. Okay for discharge from a renal standpoint. Patient can resume HD tomorrow per his routine schedule in outpatient. Patient agrees with this plan. I discussed with Hospitalist and bedside RN.  Objective Vitals:   03/27/24 2303 03/28/24 0354 03/28/24 0524 03/28/24 0736  BP: (!) 154/76 (!) 165/79 (!) 150/74 (!) 163/76  Pulse: (!) 59 71 64 74  Resp: 20 20    Temp: 97.8 F (36.6 C) 97.8 F (36.6 C)  98 F (36.7 C)  TempSrc: Oral Oral  Oral  SpO2: 92% 96% 99% 96%  Weight:      Height:       Physical Exam General: Awake, alert, on RA, NAD Heart: S1 and S2; No murmurs, gallops, or rubs Lungs: Clear anteriorly Abdomen: Soft and non-tender Extremities: No LE edema; L foot wrapped (s/p L TMA) Dialysis Access: [facility]  Filed Weights   03/25/24 1216 03/26/24 0045 03/26/24 0440  Weight: 68.2 kg 68.2 kg 66.2 kg   No intake or output data in the 24 hours ending 03/28/24 1012  Additional Objective Labs: Basic Metabolic Panel: Recent Labs  Lab 03/26/24 0644 03/27/24 0306 03/27/24 0307 03/28/24 0316  NA 132*  --  135 136  K 4.2  --  4.0 4.1  CL 92*  --  97* 98  CO2 24  --  26 28  GLUCOSE 140*  --  166* 143*  BUN 12  --  21 26*  CREATININE 4.05*  --  5.44* 7.14*  CALCIUM  8.5*  --  8.4* 8.5*  PHOS 3.1 2.5 2.5 2.7   Liver Function Tests: Recent Labs  Lab 03/26/24 0644 03/27/24 0307 03/28/24 0316  ALBUMIN  2.3* 2.0* 2.1*   No results for input(s): LIPASE, AMYLASE in the last 168 hours. CBC: Recent Labs  Lab 03/22/24 0317 03/23/24 0328 03/27/24 0306  WBC 7.5 7.3 6.9  HGB 9.5* 9.4* 8.9*  HCT 28.6* 29.2* 28.0*  MCV 93.5 93.3 94.0  PLT 139* 152 238   Blood Culture    Component Value Date/Time   SDES TISSUE 03/25/2024 1350    SPECREQUEST LEFT FOREFOOT 03/25/2024 1350   CULT  03/25/2024 1350    FEW METHICILLIN RESISTANT STAPHYLOCOCCUS AUREUS NO ANAEROBES ISOLATED; CULTURE IN PROGRESS FOR 5 DAYS    REPTSTATUS PENDING 03/25/2024 1350    Cardiac Enzymes: No results for input(s): CKTOTAL, CKMB, CKMBINDEX, TROPONINI in the last 168 hours. CBG: Recent Labs  Lab 03/23/24 1644 03/23/24 2113 03/24/24 0606 03/25/24 1224 03/25/24 1420  GLUCAP 135* 127* 94 82 79   Iron Studies: No results for input(s): IRON, TIBC, TRANSFERRIN, FERRITIN in the last 72 hours. Lab Results  Component Value Date   INR 1.1 05/19/2023   INR 1.1 03/06/2023   INR 1.1 08/30/2020   Studies/Results: No results found.  Medications:   amitriptyline   10 mg Oral QHS   amLODipine   10 mg Oral q morning   aspirin  EC  81 mg Oral Daily   atorvastatin   40 mg Oral Daily   Chlorhexidine  Gluconate Cloth  6 each Topical Q0600   clopidogrel   75 mg Oral Daily   [START ON 03/31/2024] darbepoetin (ARANESP ) injection - DIALYSIS  100 mcg Subcutaneous Q Sun-1800   doxycycline   100 mg Oral Q12H   gabapentin   100 mg Oral QHS   guaiFENesin   600 mg Oral BID   haloperidol  lactate  2 mg Intramuscular Once   heparin   5,000 Units Subcutaneous Q8H   hydrALAZINE   10 mg Oral Q8H   levothyroxine   50 mcg Oral Q0600   metoprolol  tartrate  50 mg Oral BID   pantoprazole   40 mg Oral Daily   sodium chloride  flush  3 mL Intravenous Q12H   sodium chloride  flush  3 mL Intravenous Q12H    Dialysis Orders: MWF - [facility] 4hrs, BFR 400, DFR Auto 1.5,  EDW 69kg, 3K/ 2.5Ca Heparin  3000 unit bolus and 500 unit mid-run Mircera 150 mcg q2wks - last 9/29  Assessment/Plan: Non-healing L foot amputation site - On IV ABXs. VVS following. S/p angio 10/8. S/p TMA 10/13. ESRD - on HD MWF. Given high HD census today, plan for his next HD today but he refused treatment. Labs and volume remains stable. Next HD 10/17 per his routine schedule. He  can resume treatment in outpatient tomorrow if he is discharged today. Hypertension/volume  - Euvolemic on exam. Under EDW here, lower EDW at dc. BP acceptable. Anemia of CKD - Hgb 8.9. Last ESA dose 9/29, will ESA resumed-per pharmacy protocol here, 1st dose scheduled on 10/19. Okay to transfuse for Hgb < 7. Secondary Hyperparathyroidism -  Corr Ca 10-monitor. Phos is low. Stopped binders. Nutrition - Renal diet with fluid restriction Dispo - Okay for discharge from a renal standpoint. He can resume HD tomorrow (03/29/24) in outpatient  Charmaine Piety, NP [facility] 03/28/2024,10:12 AM  LOS: 9 days

## 2024-03-28 NOTE — Discharge Instructions (Signed)

## 2024-03-28 NOTE — Evaluation (Signed)
 Physical Therapy Evaluation Patient Details Name: [name] [name] MRN: [id number] DOB: April 22, 1961 Today's Date: 03/28/2024  History of Present Illness  Pt is a 63 y.o male admitted 10/7 for post-op foot pain. 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 S/p LLE angiogram 10/8. S/p L TMA.  PMH: HTN, HLD, DM, ESRD, hypothyroidism  Clinical Impression  Patient is s/p above surgery resulting in functional limitations due to the deficits listed below (see PT Problem List). Mother present. Patient could perform lateral scoot from bed to chair - (simulating w/c transfer) with min assist for WB precautions, supporting his LLE. Pt could not safely maintain WB precautions to transfer to [facility] after several attempts and instruction. Needs frequent reinforcement and assisted for WB precautions. May benefit from drop arm BSC to ease burden of care. Patient will benefit from continued inpatient follow up therapy, <3 hours/day.   Patient will benefit from acute skilled PT to increase their independence and safety with mobility to facilitate discharge.         If plan is discharge home, recommend the following: A lot of help with walking and/or transfers;Assistance with cooking/housework;A little help with bathing/dressing/bathroom;Direct supervision/assist for medications management;Direct supervision/assist for financial management;Assist for transportation;Help with stairs or ramp for entrance;Supervision due to cognitive status   Can travel by private vehicle   No    Equipment Recommendations Other (comment) (Drop arm BSC)  Recommendations for Other Services       Functional Status Assessment Patient has had a recent decline in their functional status and demonstrates the ability to make significant improvements in function in a reasonable and predictable amount of time.     Precautions / Restrictions Precautions Precautions: Fall Recall of Precautions/Restrictions: Intact Restrictions Weight Bearing  Restrictions Per Provider Order: No      Mobility  Bed Mobility Overal bed mobility: Needs Assistance Bed Mobility: Sidelying to Sit     Supine to sit: HOB elevated, Min assist Sit to supine: Min assist   General bed mobility comments: Min assist to rise to EOB, extra time, looses balance towards Rt frequently without assist. Min assist to  support LLE and reposition in bed.    Transfers Overall transfer level: Needs assistance Equipment used: None Transfers: Bed to chair/wheelchair/BSC             General transfer comment: Refused RW despite attempting initially. Pt does not feel strong or confident enough. Non compliant with WB precautions requiring hands on assist for LLE at all times to maintain NWB. Perform lateral scoot from bed to chair with min assist and back. Unsafe with transition to bed attempting to lie down before fully in bed. Pt could not squat pivot over standard BSC. max cues for safety and WB precautions, pt becomes frustrated.    Ambulation/Gait                  Stairs            Wheelchair Mobility     Tilt Bed    Modified Rankin (Stroke Patients Only)       Balance Overall balance assessment: Needs assistance Sitting-balance support: Bilateral upper extremity supported, Feet supported Sitting balance-Leahy Scale: Poor Sitting balance - Comments: LOB towards Rt and posteriorly at times.                                     Pertinent Vitals/Pain Pain Assessment Pain  Assessment: Faces Faces Pain Scale: Hurts even more Pain Location: L foot Pain Descriptors / Indicators: Discomfort, Grimacing Pain Intervention(s): Limited activity within patient's tolerance, Monitored during session, Repositioned    Home Living Family/patient expects to be discharged to:: Private residence Living Arrangements: Parent Available Help at Discharge: Family;Available 24 hours/day Type of Home: House Home Access: Ramped entrance        Home Layout: One level Home Equipment: Shower seat;Cane - single point;Rolling Walker (2 wheels);Wheelchair - manual;Wheelchair - power;Grab bars - toilet;BSC/3in1      Prior Function Prior Level of Function : Needs assist             Mobility Comments: Uses w/c primarily reports I just get off the bed and in the wheelchair, most likely squat pivot. Says use of RW makes me fall ADLs Comments: Assist from family with all ADLs at w/c level     Extremity/Trunk Assessment   Upper Extremity Assessment Upper Extremity Assessment: Defer to OT evaluation    Lower Extremity Assessment Lower Extremity Assessment: LLE deficits/detail LLE Deficits / Details: foot bandaged    Cervical / Trunk Assessment Cervical / Trunk Assessment: Kyphotic  Communication   Communication Communication: No apparent difficulties    Cognition Arousal: Alert Behavior During Therapy: Agitated   PT - Cognitive impairments: Awareness, Sequencing, Problem solving, Safety/Judgement                         Following commands: Impaired Following commands impaired: Only follows one step commands consistently     Cueing Cueing Techniques: Verbal cues, Gestural cues, Tactile cues     General Comments General comments (skin integrity, edema, etc.): mother present. Donned post-op shoe for safety/protection. Needs constant cues to maintain NWB on Lt.    Exercises     Assessment/Plan    PT Assessment Patient needs continued PT services  PT Problem List Decreased strength;Decreased range of motion;Decreased balance;Decreased activity tolerance;Decreased mobility;Decreased cognition;Decreased knowledge of use of DME;Decreased safety awareness;Decreased knowledge of precautions;Pain       PT Treatment Interventions DME instruction;Gait training;Functional mobility training;Therapeutic activities;Therapeutic exercise;Balance training;Neuromuscular re-education;Cognitive  remediation;Patient/family education;Wheelchair mobility training;Modalities    PT Goals (Current goals can be found in the Care Plan section)  Acute Rehab PT Goals Patient Stated Goal: go home PT Goal Formulation: With patient Time For Goal Achievement: 04/11/24 Potential to Achieve Goals: Fair    Frequency Min 2X/week     Co-evaluation               AM-PAC PT 6 Clicks Mobility  Outcome Measure Help needed turning from your back to your side while in a flat bed without using bedrails?: A Little Help needed moving from lying on your back to sitting on the side of a flat bed without using bedrails?: A Little Help needed moving to and from a bed to a chair (including a wheelchair)?: A Little Help needed standing up from a chair using your arms (e.g., wheelchair or bedside chair)?: Total Help needed to walk in hospital room?: Total Help needed climbing 3-5 steps with a railing? : Total 6 Click Score: 12    End of Session   Activity Tolerance:  (Weakness) Patient left: in bed;with call bell/phone within reach;with bed alarm set;with family/visitor present   PT Visit Diagnosis: Unsteadiness on feet (R26.81);Other abnormalities of gait and mobility (R26.89);Muscle weakness (generalized) (M62.81);Difficulty in walking, not elsewhere classified (R26.2);Other symptoms and signs involving the nervous system (R29.898);Pain Pain - Right/Left: Left  Pain - part of body: Ankle and joints of foot    Time: 1201-1228 PT Time Calculation (min) (ACUTE ONLY): 27 min   Charges:   PT Evaluation $PT Eval Low Complexity: 1 Low PT Treatments $Therapeutic Activity: 8-22 mins PT General Charges $$ ACUTE PT VISIT: 1 Visit         Leontine Roads, PT, DPT [facility]  Rehabilitation Services Physical Therapist Office: [phone] Website: [email or web address]   Leontine GORMAN Roads 03/28/2024, 12:41 PM

## 2024-03-28 NOTE — TOC Transition Note (Signed)
 Transition of Care (TOC) - Discharge Note Rayfield Gobble RN, BSN Inpatient Care Management Unit 4E- RN Case Manager See Treatment Team for direct phone #   Patient Details  Name: [name] [name] MRN: [id number] Date of Birth: 1960/07/09  Transition of Care [facility]) CM/SW Contact:  Gobble Rayfield Hurst, RN Phone Number: 03/28/2024, 1:31 PM   Clinical Narrative:    Pt stable for discharge, has refused SNF and wants to return home w/ [facility]. Orders have been placed for HHPT/OT.  CM in to speak with pt, mom also present at bedside. Confirmed plan to return home w/ HH- pt and mom voiced that pt has needed DME at home- declined any DME needs.  Pt voiced he will stay with his mom who lives not door to him at [address] - phone # and PCP also confirmed.   List provided for  [facility] choice Per CMS guidelines from [employer] website with star ratings (copy placed in shadow chart)- pt voiced he has had HH in past- thinks it may have been [facility], per review in [facility] pt used [facility] for services- per pt and mom they would like to see if [facility] can staff for services again, if not then agreeable to try [facility].   Referral sent to [facility] liaison via epic hub- referral has been declined due to inadequate staffing.   Referral sent to [facility] liaison via epic hub- referral has been accepted.   Mom to transport home.   IP CM interventions have been completed no further needs noted.    Final next level of care: Home w Home Health Services Barriers to Discharge: Barriers Resolved   Patient Goals and CMS Choice Patient states their goals for this hospitalization and ongoing recovery are:: return home and have foot heel CMS Medicare.gov Compare Post Acute Care list provided to:: Patient Choice offered to / list presented to : Patient, Parent      Discharge Placement                 Home w/ [facility]      Discharge Plan and Services Additional resources added to the  After Visit Summary for     Discharge Planning Services: CM Consult Post Acute Care Choice: Home Health          DME Arranged: N/A DME Agency: NA       HH Arranged: PT, OT HH Agency: [facility] Date [facility] Agency Contacted: 03/28/24 Time HH Agency Contacted: 1331 Representative spoke with at [facility] Agency: [place]  Social Drivers of Health (SDOH) Interventions SDOH Screenings   Food Insecurity: No Food Insecurity (03/19/2024)  Housing: Low Risk  (03/20/2024)  Transportation Needs: No Transportation Needs (03/19/2024)  Utilities: Not At Risk (03/19/2024)  Depression (PHQ2-9): Low Risk  (09/18/2018)  Social Connections: Unknown (03/20/2024)  Tobacco Use: Low Risk  (03/25/2024)     Readmission Risk Interventions    03/28/2024    1:14 PM 03/29/2023    1:45 PM 03/07/2023    1:31 PM  Readmission Risk Prevention Plan  Transportation Screening Complete Complete Complete  HRI or Home Care Consult   Complete  Palliative Care Screening   Not Applicable  Medication Review (RN Care Manager) Complete Complete Complete  PCP or Specialist appointment within 3-5 days of discharge  Complete   HRI or Home Care Consult Complete Complete   SW Recovery Care/Counseling Consult Patient refused Complete   Palliative Care Screening Complete Complete   Skilled Nursing Facility Patient Refused Complete

## 2024-03-28 NOTE — Care Management Important Message (Signed)
 Important Message  Patient Details  Name: [name] [name] MRN: [id number] Date of Birth: [birth year]   Important Message Given:  Yes - Medicare IM     [name] 03/28/2024, 10:18 AM

## 2024-03-28 NOTE — Progress Notes (Signed)
 [name] to be D/C'd Home per MD order.  Discussed with the patient and all questions fully answered.  VSS, Skin clean, dry and intact without evidence of skin break down, no evidence of skin tears noted. IV catheter discontinued intact. Site without signs and symptoms of complications. Dressing and pressure applied.  An After Visit Summary was printed and given to the patient. Patient received prescription.  D/c education completed with patient/family including follow up instructions, medication list, d/c activities limitations if indicated, with other d/c instructions as indicated by MD - patient able to verbalize understanding, all questions fully answered.   Patient instructed to return to ED, call 911, or call MD for any changes in condition.   Patient escorted via WC, and D/C home via private auto.  Rocky HERO Lamona Eimer 03/28/2024 3:00 PM

## 2024-03-28 NOTE — Plan of Care (Signed)
  Problem: Clinical Measurements: Goal: Ability to maintain clinical measurements within normal limits will improve Outcome: Progressing Goal: Will remain free from infection Outcome: Progressing Goal: Diagnostic test results will improve Outcome: Progressing Goal: Respiratory complications will improve Outcome: Progressing Goal: Cardiovascular complication will be avoided Outcome: Progressing   Problem: Coping: Goal: Level of anxiety will decrease Outcome: Progressing   Problem: Nutrition: Goal: Adequate nutrition will be maintained Outcome: Progressing

## 2024-03-28 NOTE — Discharge Planning (Signed)
 [facility] Patient Discharge Orders- [facility] CLINIC: [facility]  Patient's name: CHRISTYAN REGER Admit/DC Dates: 03/19/2024 - 03/28/2024  Discharge Diagnoses: L foot non-healing amputation site -  S/p  left 2nd/3rd toes amputation on 02/19/2024, S/p TMA 10/13. VVS following Surgical tissue (+) MRSA - see below on PO ABXs PAD - S/p angio 10/8 - On ASA and Plavix  Discharged with HHPT/OT, DMEs  Aranesp : Given: No    Last Hgb: 8.9 PRBC's Given: No ESA dose for discharge: Continue mircera to 150 mcg IV q 2 weeks  IV Iron dose at discharge: N/A  Heparin  change: No  EDW Change: Yes New EDW: Weight under here. Lower to 67.5kg. Notify renal team if he cannot tolerate new EDW  Bath Change: No  Access intervention/Change: No  Hectorol/Calcitriol change: N/A  Discharge Labs: Calcium  8.5  Phosphorus 2.7  Albumin  2.1  K+ 4.1  IV Antibiotics: On PO Doxycycline  100mg  BID X 4 days. S/p L foot TMA, soft tissue (+) MRSA. Dose is appropriate.  On Coumadin ?: No      D/C Meds to be reconciled by nurse after every discharge.  Completed By: Charmaine Piety, NP   Reviewed by: MD:______ RN_______

## 2024-03-28 NOTE — Progress Notes (Signed)
 Initial Nutrition Assessment  DOCUMENTATION CODES:   Not applicable  INTERVENTION:   -1 packet Juven BID, each packet provides 95 calories, 2.5 grams of protein (collagen), and 9.8 grams of carbohydrate (3 grams sugar); also contains 7 grams of L-arginine and L-glutamine, 300 mg vitamin C, 15 mg vitamin E, 1.2 mcg vitamin B-12, 9.5 mg zinc , 200 mg calcium , and 1.5 g  Calcium  Beta-hydroxy-Beta-methylbutyrate to support wound healing  -Renal MVI daily -Continue with liberalized diet of regular for widest variety of meal selections -Double protein portions with meals -RD provided Suggestions for Increasing Calories and Protein handout from [facility]'s Nutrition Care Manual; attached to AVS/ discharge summary  -RD will draw labs to rule out potential micronutrient deficiencies which may impede wound healing: zinc , vitamin C, vitamin A, and CRP   NUTRITION DIAGNOSIS:   Increased nutrient needs related to post-op healing as evidenced by estimated needs.  GOAL:   Patient will meet greater than or equal to 90% of their needs  MONITOR:   PO intake, Supplement acceptance, Diet advancement  REASON FOR ASSESSMENT:   Consult Assessment of nutrition requirement/status  ASSESSMENT:   PMH significant of hypertension, hyperlipidemia, diabetes mellitus type 2, ESRD on HD, hypothyroidism who presents with post-operative foot pain.  Admitted with post-operative wound infection.   9/3- s/p left second and third toe amputation due to gangrene 10/5- MRI revealed full-thickness wound at the tip of the second metatarsal head with either exposure or near exposure of the second metatarsal head  10/8- s/p angiogram- revealed severe small vessel disease, recommending lt BKA 10/10- lt TMA cancelled 10/13- s/p Procedure:  1.  Transmetatarsal amputation of left foot 2.  Application of amniotic graft 5 x 5 single layer bio Vance, left foot  Patient unavailable at time of visit. Attempted to speak with  patient via call to hospital room phone, however, unable to reach. RD unable to obtain further nutrition-related history or complete nutrition-focused physical exam at this time.    Per nephrology notes, next HD planned for today (03/28/24).   Per podiatry and vascular surgery notes, Mr Mcdiarmid refusing lt BKA, but is aware of his high risk for further amputation.   Case discussed with RN, who reports good appetite, eating something at every meal. He is currently on a regular diet. Documented meal completions 25-100%.  Reviewed weight history. Noted he has experienced a 15.1% weight loss over the past 6 months, which is significant for time frame. Per nephrology notes, EDW is 69 kg. Per nursing assessment, he also has generalized edema, which may be masking true weight loss as well as fat and muscle depletions. He is currently under his dry weight. Highly suspect malnutrition (has history of severe malnutrition which was identified during prior admission), however, unable to identify at this time. He would greatly benefit from addition of oral nutrition supplements.   Per MD notes, he is medically stable for discharge after HD and therapy evaluation today.   Medications reviewed and include aranesp .   Lab Results  Component Value Date   HGBA1C 5.1 01/11/2023   PTA DM medications are none.   Labs reviewed: CBGS: 79-82 (inpatient orders for glycemic control are none).    Diet Order:   Diet Order             Diet regular Room service appropriate? Yes; Fluid consistency: Thin  Diet effective now                   EDUCATION NEEDS:  No education needs have been identified at this time  Skin:  Skin Assessment: Skin Integrity Issues: Skin Integrity Issues:: Incisions Incisions: lt foot s/p TMA  Last BM:  03/26/24 (type 6)  Height:   Ht Readings from Last 1 Encounters:  03/25/24 5' 10 (1.778 m)    Weight:   Wt Readings from Last 1 Encounters:  03/26/24 66.2 kg     Ideal Body Weight:  75.5 kg  BMI:  Body mass index is 20.94 kg/m.  Estimated Nutritional Needs:   Kcal:  2100-2300  Protein:  115-130 grams  Fluid:  1000 ml + UOP    Margery ORN, RD, LDN, CDCES Registered Dietitian III Certified Diabetes Care and Education Specialist If unable to reach this RD, please use RD Inpatient group chat on secure chat between hours of 8am-4 pm daily

## 2024-03-28 NOTE — Progress Notes (Signed)
 D/c possibility noted. Contacted out-pt HD clinic, [facility], to inform of pt d/c and anticipated arrival back at clinic to resume treatment normal time tomorrow. No further support needed.   Brystol Wasilewski [employer] Navigator [phone]

## 2024-03-28 NOTE — Anesthesia Postprocedure Evaluation (Signed)
 Anesthesia Post Note  Patient: [name] [name]  Procedure(s) Performed: AMPUTATION, FOOT, RAY LEFT (Left: Foot)     Patient location during evaluation: PACU Anesthesia Type: MAC Level of consciousness: awake and alert Pain management: pain level controlled Vital Signs Assessment: post-procedure vital signs reviewed and stable Respiratory status: spontaneous breathing, nonlabored ventilation, respiratory function stable and patient connected to nasal cannula oxygen Cardiovascular status: stable and blood pressure returned to baseline Postop Assessment: no apparent nausea or vomiting Anesthetic complications: no   No notable events documented.  Last Vitals:  Vitals:   03/28/24 0524 03/28/24 0736  BP: (!) 150/74 (!) 163/76  Pulse: 64 74  Resp:    Temp:  36.7 C  SpO2: 99% 96%    Last Pain:  Vitals:   03/28/24 0736  TempSrc: Oral  PainSc:                  Lawyer Washabaugh L Doniqua Saxby

## 2024-03-29 DIAGNOSIS — Z992 Dependence on renal dialysis: Secondary | ICD-10-CM | POA: Diagnosis not present

## 2024-03-29 DIAGNOSIS — D689 Coagulation defect, unspecified: Secondary | ICD-10-CM | POA: Diagnosis not present

## 2024-03-29 DIAGNOSIS — N2581 Secondary hyperparathyroidism of renal origin: Secondary | ICD-10-CM | POA: Diagnosis not present

## 2024-03-29 DIAGNOSIS — Z4901 Encounter for fitting and adjustment of extracorporeal dialysis catheter: Secondary | ICD-10-CM | POA: Diagnosis not present

## 2024-03-29 DIAGNOSIS — N186 End stage renal disease: Secondary | ICD-10-CM | POA: Diagnosis not present

## 2024-03-30 LAB — AEROBIC/ANAEROBIC CULTURE W GRAM STAIN (SURGICAL/DEEP WOUND): Gram Stain: NONE SEEN

## 2024-04-01 DIAGNOSIS — N2581 Secondary hyperparathyroidism of renal origin: Secondary | ICD-10-CM | POA: Diagnosis not present

## 2024-04-01 DIAGNOSIS — D689 Coagulation defect, unspecified: Secondary | ICD-10-CM | POA: Diagnosis not present

## 2024-04-01 DIAGNOSIS — N186 End stage renal disease: Secondary | ICD-10-CM | POA: Diagnosis not present

## 2024-04-01 DIAGNOSIS — Z4901 Encounter for fitting and adjustment of extracorporeal dialysis catheter: Secondary | ICD-10-CM | POA: Diagnosis not present

## 2024-04-01 DIAGNOSIS — Z992 Dependence on renal dialysis: Secondary | ICD-10-CM | POA: Diagnosis not present

## 2024-04-04 ENCOUNTER — Ambulatory Visit: Admitting: Podiatry

## 2024-04-04 ENCOUNTER — Encounter: Payer: Self-pay | Admitting: Podiatry

## 2024-04-04 DIAGNOSIS — I96 Gangrene, not elsewhere classified: Secondary | ICD-10-CM

## 2024-04-04 DIAGNOSIS — I739 Peripheral vascular disease, unspecified: Secondary | ICD-10-CM

## 2024-04-04 DIAGNOSIS — T8130XA Disruption of wound, unspecified, initial encounter: Secondary | ICD-10-CM

## 2024-04-04 DIAGNOSIS — Z9889 Other specified postprocedural states: Secondary | ICD-10-CM

## 2024-04-04 MED ORDER — DOXYCYCLINE HYCLATE 100 MG PO TABS: 100.0000 mg | ORAL_TABLET | Freq: Two times a day (BID) | ORAL | 0 refills | Status: AC

## 2024-04-04 NOTE — Progress Notes (Signed)
  Subjective:  Patient ID: [name] [name], male    DOB: Mar 02, 1961,  MRN: [id number]  No chief complaint on file.   DOS: 03/25/24 Procedure: 1.  Transmetatarsal amputation of left foot 2.  Application of amniotic graft 5 x 5 single layer bio Vance, left foot  63 y.o. male seen for post op check.  Patient is now 10 days out from transmetatarsal amputation of the left foot.  He is nonweightbearing.  Has left dressing clean dry and intact as instructed.  Review of Systems: Negative except as noted in the HPI. Denies N/V/F/Ch.   Objective:   Constitutional Well developed. Well nourished.  Vascular Foot warm and well perfused. Capillary refill normal to all digits.   No calf pain with palpation  Neurologic Normal speech. Oriented to person, place, and time. Epicritic sensation diminished to left foot  Dermatologic Amputation site  with some necrotic changes along the incision line, still coapted and with some erythema dorsal medial aspect. No malodor or drainage    Also with worsening gangrenous changes to the right hallux with necrotic tissue over the distal tuft of the toe much worse from prior. Ulceraiton of the medial aspect 2nd toe superficial        Orthopedic: S/p L foot TMA   Radiographs: Interval transmetatarsal amputation as described.   Pathology: A. FOOT, LEFT FOREFOOT, AMPUTATION:  - Skin with ulceration and underlying necrosis and inflammatory changes.  - Necroinflammatory changes extend to the soft tissue margin.  - Negative for active osteomyelitis.   Micro: L foot tissue at 2nd met head: MRSA  Assessment:   1. Gangrene (HCC)   2. Wound dehiscence   3. PAD (peripheral artery disease)   4. Post-operative state    Osteomyelitis and gangrene with non healing 2nd/3rd toe amputation site of left foot s/p L foot transmetatarsal amputation   Plan:  Patient was evaluated and treated and all questions answered.  10 days s/p L foot TMA with graft  application -Concern for necrosis along the left tma site, will proceed with betadine wet to dry dressing, will monitor but if necrosis worsens may need revision to bka/aka level. He is aware of this risk.  - In regards to right hallux worsening gangrenous changes, have discussed with vascular PA and they will try to get patient into the office to eval, may require intervention on RLE, appreciate vascular surgery recs - For now recommend daily betadine wet to dry dressings on right hallux and 2nd toe -XR: expected changes -WB Status: NWB in post op shoe to bilateral LE -Sutures: remain intact. -Medications/ABX: Doxycycline  rx sent due to mild cellultis bilateral LE -Dressing: Replaced new betadine wet to dry dressing bilateral foot. - F/u Plan: Patient to follow-up in 1 week        Marolyn JULIANNA Honour, DPM [facility]

## 2024-04-08 DIAGNOSIS — Z992 Dependence on renal dialysis: Secondary | ICD-10-CM | POA: Diagnosis not present

## 2024-04-08 DIAGNOSIS — D689 Coagulation defect, unspecified: Secondary | ICD-10-CM | POA: Diagnosis not present

## 2024-04-08 DIAGNOSIS — N2581 Secondary hyperparathyroidism of renal origin: Secondary | ICD-10-CM | POA: Diagnosis not present

## 2024-04-08 DIAGNOSIS — Z4901 Encounter for fitting and adjustment of extracorporeal dialysis catheter: Secondary | ICD-10-CM | POA: Diagnosis not present

## 2024-04-08 DIAGNOSIS — N186 End stage renal disease: Secondary | ICD-10-CM | POA: Diagnosis not present

## 2024-04-10 NOTE — Progress Notes (Unsigned)
 Office Note     CC: Bilateral lower extremity critical limb ischemia tissue loss Requesting Provider:  Kip Righter, MD  HPI: [name] [name] is a 63 y.o. ([birth year]) male presenting in follow-up with bilateral lower extremity critical limb ischemia with tissue loss.  He underwent left sided angiography earlier this month, with no intervenable lesions.  He presents today due to right sided toe wounds.  The pt is *** on a statin for cholesterol management.  The pt is *** on a daily aspirin .   Other AC:  *** The pt is *** on medication for hypertension.   The pt is *** diabetic.  Tobacco hx:  ***  Past Medical History:  Diagnosis Date   Anemia    Arthritis    Cardiomyopathy [facility])    July 2022   Diabetes mellitus [facility]    Dialysis patient    GERD (gastroesophageal reflux disease)    HTN (hypertension)    Hyperlipidemia    Hypothyroidism    Nephrotic syndrome    HD MWF   Obesity    Pulmonary hypertension (HCC)    July 2022 in setting of nephrotic syndrome   Secondary hyperparathyroidism of renal origin     Past Surgical History:  Procedure Laterality Date   ABDOMINAL AORTOGRAM W/LOWER EXTREMITY N/A 01/30/2024   Procedure: ABDOMINAL AORTOGRAM W/LOWER EXTREMITY;  Surgeon: Serene Gaile ORN, MD;  Location: [facility] INVASIVE CV LAB;  Service: Cardiovascular;  Laterality: N/A;   ABDOMINAL AORTOGRAM W/LOWER EXTREMITY N/A 03/20/2024   Procedure: ABDOMINAL AORTOGRAM W/LOWER EXTREMITY;  Surgeon: Lanis Fonda BRAVO, MD;  Location: [facility] INVASIVE CV LAB;  Service: Cardiovascular;  Laterality: N/A;   AMPUTATION Left 03/25/2024   Procedure: AMPUTATION, FOOT, RAY LEFT;  Surgeon: Malvin Marsa FALCON, DPM;  Location: [facility] OR;  Service: Orthopedics/Podiatry;  Laterality: Left;  Tranmetarsal amputation   AMPUTATION TOE Left 02/14/2024   Procedure: AMPUTATION, TOE LEFT SECOND AND THIRD TOES;  Surgeon: Gershon Donnice SAUNDERS, DPM;  Location: [facility] OR;  Service: Orthopedics/Podiatry;  Laterality: Left;   AV  FISTULA PLACEMENT Left 12/27/2022   Procedure: INSERTION OF LEFT ARM ARTERIOVENOUS (AV) GORE-TEX GRAFT;  Surgeon: Eliza Lonni RAMAN, MD;  Location: [facility] OR;  Service: Vascular;  Laterality: Left;   AV FISTULA PLACEMENT Right 08/17/2023   Procedure: INSERTION OF ARTERIOVENOUS (AV) GORE-TEX GRAFT RIGHT ARM USING 4-18mm GORETEX STRETCH GRAFT;  Surgeon: Magda Debby SAILOR, MD;  Location: [facility] OR;  Service: Vascular;  Laterality: Right;   AVGG REMOVAL Left 03/15/2023   Procedure: REMOVAL OF LEFT UPPER ARM ARTERIOVENOUS GORETEX GRAFT (AVGG);  Surgeon: Magda Debby SAILOR, MD;  Location[facility];  Service: Vascular;  Laterality: Left;   BIOPSY  01/13/2023   Procedure: BIOPSY;  Surgeon: Cinderella Deatrice FALCON, MD;  Location: [facility]O SUITE;  Service: Endoscopy;;   CATARACT EXTRACTION, BILATERAL  2018   COLONOSCOPY WITH PROPOFOL  N/A 01/13/2023   Procedure: COLONOSCOPY WITH PROPOFOL ;  Surgeon: Cinderella Deatrice FALCON, MD;  Location: [facility] SUITE;  Service: Endoscopy;  Laterality: N/A;   DIALYSIS/PERMA CATHETER INSERTION Right 01/10/2024   Procedure: DIALYSIS/PERMA CATHETER INSERTION;  Surgeon: Gretta Lonni PARAS, MD;  Location: [facility];  Service: Cardiovascular;  Laterality: Right;   INSERTION OF DIALYSIS CATHETER Right 12/27/2022   Procedure: INSERTION OF Right Internal Jugular  DIALYSIS CATHETER;  Surgeon: Eliza Lonni RAMAN, MD;  Location: [facility] OR;  Service: Vascular;  Laterality: Right;   INSERTION OF DIALYSIS CATHETER N/A 03/15/2023   Procedure: INSERTION OF TUNNELED DIALYSIS CATHETER;  Surgeon: Magda Debby SAILOR, MD;  Location: [facility] OR;  Service: Vascular;  Laterality: N/A;   INSERTION OF DIALYSIS CATHETER Right 03/25/2023   Procedure: INSERTION OF RIGHT TUNNELED DIALYSIS CATHETER;  Surgeon: Pearline Norman RAMAN, MD;  Location: [facility] OR;  Service: Vascular;  Laterality: Right;   LOWER EXTREMITY INTERVENTION  01/30/2024   Procedure: LOWER EXTREMITY INTERVENTION;  Surgeon: Serene Gaile ORN, MD;  Location: [facility] INVAS[facility] CV LAB;   Service: Cardiovascular;;   PERIPHERAL INTRAVASCULAR LITHOTRIPSY  01/30/2024   Procedure: PERIPHERAL INTRAVASCULAR LITHOTRIPSY;  Surgeon: Serene Gaile ORN, MD;  Location: [facility] LAB;  Service: Cardiovascular;;   PERIPHERAL VASCULAR ATHERECTOMY  01/30/2024   Procedure: PERIPHERAL VASCULAR ATHERECTOMY;  Surgeon: Serene Gaile ORN, MD;  Location: [facility];  Service: Cardiovascular;;   PERIPHERAL VASCULAR THROMBECTOMY Right 12/20/2023   Procedure: PERIPHERAL VASCULAR THROMBECTOMY;  Surgeon: Pearline Norman RAMAN, MD;  Location: [facility];  Service: Cardiovascular;  Laterality: Right;   TEE WITHOUT CARDIOVERSION N/A 03/20/2023   Procedure: TRANSESOPHAGEAL ECHOCARDIOGRAM;  Surgeon: Barbaraann Darryle Ned, MD;  Location: [facility] OR;  Service: Cardiovascular;  Laterality: N/A;   TONSILLECTOMY     VENOUS ANGIOPLASTY  12/20/2023   Procedure: VENOUS ANGIOPLASTY;  Surgeon: Pearline Norman RAMAN, MD;  Location: [facility];  Service: Cardiovascular;;   VENOUS STENT  12/20/2023   Procedure: VENOUS STENT;  Surgeon: Pearline Norman RAMAN, MD;  Location: [facility];  Service: Cardiovascular;;  outflow    Social History   Socioeconomic History   Marital status: Single    Spouse name: Not on file   Number of children: Not on file   Years of education: Not on file   Highest education level: Not on file  Occupational History   Not on file  Tobacco Use   Smoking status: Never   Smokeless tobacco: Never  Vaping Use   Vaping status: Never Used  Substance and Sexual Activity   Alcohol use: Never   Drug use: Never   Sexual activity: Not on file  Other Topics Concern   Not on file  Social History Narrative   Not on file   Social Drivers of Health   Financial Resource Strain: Not on file  Food Insecurity: No Food Insecurity (03/19/2024)   Hunger Vital Sign    Worried About Running Out of Food in the Last Year: Never true    Ran Out of Food in the Last Year: Never true  Transportation Needs: No Transportation  Needs (03/19/2024)   PRAPARE - Administrator, Civil Service (Medical): No    Lack of Transportation (Non-Medical): No  Physical Activity: Not on file  Stress: Not on file  Social Connections: Unknown (03/20/2024)   Social Connection and Isolation Panel    Frequency of Communication with Friends and Family: Not on file    Frequency of Social Gatherings with Friends and Family: Not on file    Attends Religious Services: Not on file    Active Member of Clubs or Organizations: No    Attends Banker Meetings: Not on file    Marital Status: Never married  Intimate Partner Violence: Not At Risk (03/19/2024)   Humiliation, Afraid, Rape, and Kick questionnaire    Fear of Current or Ex-Partner: No    Emotionally Abused: No    Physically Abused: No    Sexually Abused: No   *** Family History  Problem Relation Age of Onset   Hypertension Mother    Diabetes Mother    Cancer Father  prostate cancer   Hypertension Maternal Grandfather    Rheum arthritis Maternal Grandmother    CAD Neg Hx     Current Outpatient Medications  Medication Sig Dispense Refill   Accu-Chek Softclix Lancets lancets      ACETAMINOPHEN  PO Take 650 mg by mouth every 8 (eight) hours as needed for moderate pain (pain score 4-6).     amitriptyline  (ELAVIL ) 10 MG tablet Take 10 mg by mouth at bedtime.     amLODipine  (NORVASC ) 10 MG tablet Take 1 tablet (10 mg total) by mouth every morning. 30 tablet 0   aspirin  (ASPIRIN  CHILDRENS) 81 MG chewable tablet Chew 1 tablet (81 mg total) by mouth daily. 36 tablet 11   atorvastatin  (LIPITOR) 40 MG tablet Take 1 tablet (40 mg total) by mouth daily. 90 tablet 0   benzonatate  (TESSALON ) 200 MG capsule Take 200 mg by mouth 3 (three) times daily as needed for cough.     calcium  acetate (PHOSLO) 667 MG capsule Take 1,334 mg by mouth 3 (three) times daily with meals.     clopidogrel  (PLAVIX ) 75 MG tablet Take 1 tablet (75 mg total) by mouth daily. 30 tablet  11   Cyanocobalamin  (VITAMIN B12 PO) Take 1 tablet by mouth at bedtime.     doxycycline  (VIBRA -TABS) 100 MG tablet Take 1 tablet (100 mg total) by mouth 2 (two) times daily for 10 days. 20 tablet 0   gabapentin  (NEURONTIN ) 100 MG capsule Take 100 mg by mouth at bedtime.     hydrALAZINE  (APRESOLINE ) 10 MG tablet Take 1 tablet (10 mg total) by mouth every 8 (eight) hours. (Patient not taking: Reported on 04/04/2024) 90 tablet 0   levothyroxine  (SYNTHROID ) 50 MCG tablet Take 50 mcg by mouth daily before breakfast.     metoprolol  tartrate (LOPRESSOR ) 50 MG tablet Take 1 tablet (50 mg total) by mouth 2 (two) times daily. 60 tablet 0   multivitamin (RENA-VIT) TABS tablet Take 1 tablet by mouth at bedtime.     mupirocin  ointment (BACTROBAN ) 2 % Apply 1 Application topically 2 (two) times daily. 30 g 2   oxyCODONE -acetaminophen  (PERCOCET/ROXICET) 5-325 MG tablet Take 1 tablet by mouth every 6 (six) hours as needed for severe pain (pain score 7-10). 6 tablet 0   pantoprazole  (PROTONIX ) 40 MG tablet Take 1 tablet (40 mg total) by mouth daily. 30 minutes before breakfast 90 tablet 3   Probiotic Product (PROBIOTIC DAILY PO) Take 1 capsule by mouth at bedtime.     senna-docusate (SENOKOT-S) 8.6-50 MG tablet Take 1 tablet by mouth daily as needed for mild constipation.     No current facility-administered medications for this visit.    No Known Allergies   REVIEW OF SYSTEMS:  *** [X]  denotes positive finding, [ ]  denotes negative finding Cardiac  Comments:  Chest pain or chest pressure:    Shortness of breath upon exertion:    Short of breath when lying flat:    Irregular heart rhythm:        Vascular    Pain in calf, thigh, or hip brought on by ambulation:    Pain in feet at night that wakes you up from your sleep:     Blood clot in your veins:    Leg swelling:         Pulmonary    Oxygen at home:    Productive cough:     Wheezing:         Neurologic    Sudden weakness in arms  or legs:      Sudden numbness in arms or legs:     Sudden onset of difficulty speaking or slurred speech:    Temporary loss of vision in one eye:     Problems with dizziness:         Gastrointestinal    Blood in stool:     Vomited blood:         Genitourinary    Burning when urinating:     Blood in urine:        Psychiatric    Major depression:         Hematologic    Bleeding problems:    Problems with blood clotting too easily:        Skin    Rashes or ulcers:        Constitutional    Fever or chills:      PHYSICAL EXAMINATION:  There were no vitals filed for this visit.  General:  WDWN in NAD; vital signs documented above Gait: Not observed HENT: WNL, normocephalic Pulmonary: normal non-labored breathing , without wheezing Cardiac: {Desc; regular/irreg:14544} HR Abdomen: soft, NT, no masses Skin: {With/Without:20273} rashes Vascular Exam/Pulses:  Right Left  Radial {Exam; arterial pulse strength 0-4:30167} {Exam; arterial pulse strength 0-4:30167}  Ulnar {Exam; arterial pulse strength 0-4:30167} {Exam; arterial pulse strength 0-4:30167}  Femoral {Exam; arterial pulse strength 0-4:30167} {Exam; arterial pulse strength 0-4:30167}  Popliteal {Exam; arterial pulse strength 0-4:30167} {Exam; arterial pulse strength 0-4:30167}  DP {Exam; arterial pulse strength 0-4:30167} {Exam; arterial pulse strength 0-4:30167}  PT {Exam; arterial pulse strength 0-4:30167} {Exam; arterial pulse strength 0-4:30167}   Extremities: {With/Without:20273} ischemic changes, {With/Without:20273} Gangrene , {With/Without:20273} cellulitis; {With/Without:20273} open wounds;  Musculoskeletal: no muscle wasting or atrophy  Neurologic: A&O X 3;  No focal weakness or paresthesias are detected Psychiatric:  The pt has {Desc; normal/abnormal:11317::Normal} affect.   Non-Invasive Vascular Imaging:   ***    ASSESSMENT/PLAN: [name] [name] is a 63 y.o. male presenting with ***   ***   Fonda FORBES Rim, MD [facility] [phone]

## 2024-04-10 NOTE — H&P (View-Only) (Signed)
 Office Note     CC: Bilateral lower extremity critical limb ischemia tissue loss Requesting Provider:  Kip Righter, MD  HPI: [name] [name] is a 63 y.o. ([birth year]) male presenting in follow-up with bilateral lower extremity critical limb ischemia with tissue loss.  He underwent left sided angiography earlier this month, with no intervenable lesions.  He presents today due to right sided toe wounds.  The pt is *** on a statin for cholesterol management.  The pt is *** on a daily aspirin .   Other AC:  *** The pt is *** on medication for hypertension.   The pt is *** diabetic.  Tobacco hx:  ***  Past Medical History:  Diagnosis Date   Anemia    Arthritis    Cardiomyopathy [facility])    July 2022   Diabetes mellitus [facility]    Dialysis patient    GERD (gastroesophageal reflux disease)    HTN (hypertension)    Hyperlipidemia    Hypothyroidism    Nephrotic syndrome    HD MWF   Obesity    Pulmonary hypertension (HCC)    July 2022 in setting of nephrotic syndrome   Secondary hyperparathyroidism of renal origin     Past Surgical History:  Procedure Laterality Date   ABDOMINAL AORTOGRAM W/LOWER EXTREMITY N/A 01/30/2024   Procedure: ABDOMINAL AORTOGRAM W/LOWER EXTREMITY;  Surgeon: Serene Gaile ORN, MD;  Location: [facility] INVASIVE CV LAB;  Service: Cardiovascular;  Laterality: N/A;   ABDOMINAL AORTOGRAM W/LOWER EXTREMITY N/A 03/20/2024   Procedure: ABDOMINAL AORTOGRAM W/LOWER EXTREMITY;  Surgeon: Lanis Fonda BRAVO, MD;  Location: [facility] INVASIVE CV LAB;  Service: Cardiovascular;  Laterality: N/A;   AMPUTATION Left 03/25/2024   Procedure: AMPUTATION, FOOT, RAY LEFT;  Surgeon: Malvin Marsa FALCON, DPM;  Location: [facility] OR;  Service: Orthopedics/Podiatry;  Laterality: Left;  Tranmetarsal amputation   AMPUTATION TOE Left 02/14/2024   Procedure: AMPUTATION, TOE LEFT SECOND AND THIRD TOES;  Surgeon: Gershon Donnice SAUNDERS, DPM;  Location: [facility] OR;  Service: Orthopedics/Podiatry;  Laterality: Left;   AV  FISTULA PLACEMENT Left 12/27/2022   Procedure: INSERTION OF LEFT ARM ARTERIOVENOUS (AV) GORE-TEX GRAFT;  Surgeon: Eliza Lonni RAMAN, MD;  Location: [facility] OR;  Service: Vascular;  Laterality: Left;   AV FISTULA PLACEMENT Right 08/17/2023   Procedure: INSERTION OF ARTERIOVENOUS (AV) GORE-TEX GRAFT RIGHT ARM USING 4-18mm GORETEX STRETCH GRAFT;  Surgeon: Magda Debby SAILOR, MD;  Location: [facility] OR;  Service: Vascular;  Laterality: Right;   AVGG REMOVAL Left 03/15/2023   Procedure: REMOVAL OF LEFT UPPER ARM ARTERIOVENOUS GORETEX GRAFT (AVGG);  Surgeon: Magda Debby SAILOR, MD;  Location: [facility];  Service: Vascular;  Laterality: Left;   BIOPSY  01/13/2023   Procedure: BIOPSY;  Surgeon: Cinderella Deatrice FALCON, MD;  Location: [facility] ENDO SUITE;  Service: Endoscopy;;   CATARACT EXTRACTION, BILATERAL  2018   COLONOSCOPY WITH PROPOFOL  N/A 01/13/2023   Procedure: COLONOSCOPY WITH PROPOFOL ;  Surgeon: Cinderella Deatrice FALCON, MD;  Location: [facility] ENDO SUITE;  Service: Endoscopy;  Laterality: N/A;   DIALYSIS/PERMA CATHETER INSERTION Right 01/10/2024   Procedure: DIALYSIS/PERMA CATHETER INSERTION;  Surgeon: Gretta Lonni PARAS, MD;  Location: [facility] PV LAB;  Service: Cardiovascular;  Laterality: Right;   INSERTION OF DIALYSIS CATHETER Right 12/27/2022   Procedure: INSERTION OF Right Internal Jugular  DIALYSIS CATHETER;  Surgeon: Eliza Lonni RAMAN, MD;  Location: [facility] OR;  Service: Vascular;  Laterality: Right;   INSERTION OF DIALYSIS CATHETER N/A 03/15/2023   Procedure: INSERTION OF TUNNELED DIALYSIS CATHETER;  Surgeon: Magda Debby SAILOR, MD;  Location: [facility] OR;  Service: Vascular;  Laterality: N/A;   INSERTION OF DIALYSIS CATHETER Right 03/25/2023   Procedure: INSERTION OF RIGHT TUNNELED DIALYSIS CATHETER;  Surgeon: Pearline Norman RAMAN, MD;  Location: [facility] OR;  Service: Vascular;  Laterality: Right;   LOWER EXTREMITY INTERVENTION  01/30/2024   Procedure: LOWER EXTREMITY INTERVENTION;  Surgeon: Serene Gaile ORN, MD;  Location: [facility] INVASIVE CV LAB;   Service: Cardiovascular;;   PERIPHERAL INTRAVASCULAR LITHOTRIPSY  01/30/2024   Procedure: PERIPHERAL INTRAVASCULAR LITHOTRIPSY;  Surgeon: Serene Gaile ORN, MD;  Location: [facility]VAS[facility] CV LAB;  Service: Cardiovascular;;   PERIPHERAL VASCULAR ATHERECTOMY  01/30/2024   Procedure: PERIPHERAL VASCULAR ATHERECTOMY;  Surgeon: Serene Gaile ORN, MD;  Location: [facility];  Service: Cardiovascular;;   PERIPHERAL VASCULAR THROMBECTOMY Right 12/20/2023   Procedure: PERIPHERAL VASCULAR THROMBECTOMY;  Surgeon: Pearline Norman RAMAN, MD;  Location: [facility];  Service: Cardiovascular;  Laterality: Right;   TEE WITHOUT CARDIOVERSION N/A 03/20/2023   Procedure: TRANSESOPHAGEAL ECHOCARDIOGRAM;  Surgeon: Barbaraann Darryle Ned, MD;  Location: [facility] OR;  Service: Cardiovascular;  Laterality: N/A;   TONSILLECTOMY     VENOUS ANGIOPLASTY  12/20/2023   Procedure: VENOUS ANGIOPLASTY;  Surgeon: Pearline Norman RAMAN, MD;  Location: [facility];  Service: Cardiovascular;;   VENOUS STENT  12/20/2023   Procedure: VENOUS STENT;  Surgeon: Pearline Norman RAMAN, MD;  Location: [facility];  Service: Cardiovascular;;  outflow    Social History   Socioeconomic History   Marital status: Single    Spouse name: Not on file   Number of children: Not on file   Years of education: Not on file   Highest education level: Not on file  Occupational History   Not on file  Tobacco Use   Smoking status: Never   Smokeless tobacco: Never  Vaping Use   Vaping status: Never Used  Substance and Sexual Activity   Alcohol use: Never   Drug use: Never   Sexual activity: Not on file  Other Topics Concern   Not on file  Social History Narrative   Not on file   Social Drivers of Health   Financial Resource Strain: Not on file  Food Insecurity: No Food Insecurity (03/19/2024)   Hunger Vital Sign    Worried About Running Out of Food in the Last Year: Never true    Ran Out of Food in the Last Year: Never true  Transportation Needs: No Transportation  Needs (03/19/2024)   PRAPARE - Administrator, Civil Service (Medical): No    Lack of Transportation (Non-Medical): No  Physical Activity: Not on file  Stress: Not on file  Social Connections: Unknown (03/20/2024)   Social Connection and Isolation Panel    Frequency of Communication with Friends and Family: Not on file    Frequency of Social Gatherings with Friends and Family: Not on file    Attends Religious Services: Not on file    Active Member of Clubs or Organizations: No    Attends Banker Meetings: Not on file    Marital Status: Never married  Intimate Partner Violence: Not At Risk (03/19/2024)   Humiliation, Afraid, Rape, and Kick questionnaire    Fear of Current or Ex-Partner: No    Emotionally Abused: No    Physically Abused: No    Sexually Abused: No   *** Family History  Problem Relation Age of Onset   Hypertension Mother    Diabetes Mother    Cancer Father  prostate cancer   Hypertension Maternal Grandfather    Rheum arthritis Maternal Grandmother    CAD Neg Hx     Current Outpatient Medications  Medication Sig Dispense Refill   Accu-Chek Softclix Lancets lancets      ACETAMINOPHEN  PO Take 650 mg by mouth every 8 (eight) hours as needed for moderate pain (pain score 4-6).     amitriptyline  (ELAVIL ) 10 MG tablet Take 10 mg by mouth at bedtime.     amLODipine  (NORVASC ) 10 MG tablet Take 1 tablet (10 mg total) by mouth every morning. 30 tablet 0   aspirin  (ASPIRIN  CHILDRENS) 81 MG chewable tablet Chew 1 tablet (81 mg total) by mouth daily. 36 tablet 11   atorvastatin  (LIPITOR) 40 MG tablet Take 1 tablet (40 mg total) by mouth daily. 90 tablet 0   benzonatate  (TESSALON ) 200 MG capsule Take 200 mg by mouth 3 (three) times daily as needed for cough.     calcium  acetate (PHOSLO) 667 MG capsule Take 1,334 mg by mouth 3 (three) times daily with meals.     clopidogrel  (PLAVIX ) 75 MG tablet Take 1 tablet (75 mg total) by mouth daily. 30 tablet  11   Cyanocobalamin  (VITAMIN B12 PO) Take 1 tablet by mouth at bedtime.     doxycycline  (VIBRA -TABS) 100 MG tablet Take 1 tablet (100 mg total) by mouth 2 (two) times daily for 10 days. 20 tablet 0   gabapentin  (NEURONTIN ) 100 MG capsule Take 100 mg by mouth at bedtime.     hydrALAZINE  (APRESOLINE ) 10 MG tablet Take 1 tablet (10 mg total) by mouth every 8 (eight) hours. (Patient not taking: Reported on 04/04/2024) 90 tablet 0   levothyroxine  (SYNTHROID ) 50 MCG tablet Take 50 mcg by mouth daily before breakfast.     metoprolol  tartrate (LOPRESSOR ) 50 MG tablet Take 1 tablet (50 mg total) by mouth 2 (two) times daily. 60 tablet 0   multivitamin (RENA-VIT) TABS tablet Take 1 tablet by mouth at bedtime.     mupirocin  ointment (BACTROBAN ) 2 % Apply 1 Application topically 2 (two) times daily. 30 g 2   oxyCODONE -acetaminophen  (PERCOCET/ROXICET) 5-325 MG tablet Take 1 tablet by mouth every 6 (six) hours as needed for severe pain (pain score 7-10). 6 tablet 0   pantoprazole  (PROTONIX ) 40 MG tablet Take 1 tablet (40 mg total) by mouth daily. 30 minutes before breakfast 90 tablet 3   Probiotic Product (PROBIOTIC DAILY PO) Take 1 capsule by mouth at bedtime.     senna-docusate (SENOKOT-S) 8.6-50 MG tablet Take 1 tablet by mouth daily as needed for mild constipation.     No current facility-administered medications for this visit.    No Known Allergies   REVIEW OF SYSTEMS:  *** [X]  denotes positive finding, [ ]  denotes negative finding Cardiac  Comments:  Chest pain or chest pressure:    Shortness of breath upon exertion:    Short of breath when lying flat:    Irregular heart rhythm:        Vascular    Pain in calf, thigh, or hip brought on by ambulation:    Pain in feet at night that wakes you up from your sleep:     Blood clot in your veins:    Leg swelling:         Pulmonary    Oxygen at home:    Productive cough:     Wheezing:         Neurologic    Sudden weakness in arms  or legs:      Sudden numbness in arms or legs:     Sudden onset of difficulty speaking or slurred speech:    Temporary loss of vision in one eye:     Problems with dizziness:         Gastrointestinal    Blood in stool:     Vomited blood:         Genitourinary    Burning when urinating:     Blood in urine:        Psychiatric    Major depression:         Hematologic    Bleeding problems:    Problems with blood clotting too easily:        Skin    Rashes or ulcers:        Constitutional    Fever or chills:      PHYSICAL EXAMINATION:  There were no vitals filed for this visit.  General:  WDWN in NAD; vital signs documented above Gait: Not observed HENT: WNL, normocephalic Pulmonary: normal non-labored breathing , without wheezing Cardiac: {Desc; regular/irreg:14544} HR Abdomen: soft, NT, no masses Skin: {With/Without:20273} rashes Vascular Exam/Pulses:  Right Left  Radial {Exam; arterial pulse strength 0-4:30167} {Exam; arterial pulse strength 0-4:30167}  Ulnar {Exam; arterial pulse strength 0-4:30167} {Exam; arterial pulse strength 0-4:30167}  Femoral {Exam; arterial pulse strength 0-4:30167} {Exam; arterial pulse strength 0-4:30167}  Popliteal {Exam; arterial pulse strength 0-4:30167} {Exam; arterial pulse strength 0-4:30167}  DP {Exam; arterial pulse strength 0-4:30167} {Exam; arterial pulse strength 0-4:30167}  PT {Exam; arterial pulse strength 0-4:30167} {Exam; arterial pulse strength 0-4:30167}   Extremities: {With/Without:20273} ischemic changes, {With/Without:20273} Gangrene , {With/Without:20273} cellulitis; {With/Without:20273} open wounds;  Musculoskeletal: no muscle wasting or atrophy  Neurologic: A&O X 3;  No focal weakness or paresthesias are detected Psychiatric:  The pt has {Desc; normal/abnormal:11317::Normal} affect.   Non-Invasive Vascular Imaging:   ***    ASSESSMENT/PLAN: [name] [name] is a 63 y.o. male presenting with ***   ***   Fonda FORBES Rim, MD [facility] [phone]

## 2024-04-11 ENCOUNTER — Encounter: Payer: Self-pay | Admitting: Vascular Surgery

## 2024-04-11 ENCOUNTER — Other Ambulatory Visit: Payer: Self-pay

## 2024-04-11 ENCOUNTER — Ambulatory Visit: Attending: Vascular Surgery | Admitting: Vascular Surgery

## 2024-04-11 ENCOUNTER — Ambulatory Visit (INDEPENDENT_AMBULATORY_CARE_PROVIDER_SITE_OTHER): Admitting: Podiatry

## 2024-04-11 ENCOUNTER — Encounter: Payer: Self-pay | Admitting: Podiatry

## 2024-04-11 VITALS — BP 134/75 | HR 79 | Temp 98.2°F | Resp 20 | Ht 70.0 in | Wt 145.0 lb

## 2024-04-11 DIAGNOSIS — Z9889 Other specified postprocedural states: Secondary | ICD-10-CM

## 2024-04-11 DIAGNOSIS — I70235 Atherosclerosis of native arteries of right leg with ulceration of other part of foot: Secondary | ICD-10-CM

## 2024-04-11 DIAGNOSIS — I96 Gangrene, not elsewhere classified: Secondary | ICD-10-CM

## 2024-04-11 DIAGNOSIS — I739 Peripheral vascular disease, unspecified: Secondary | ICD-10-CM

## 2024-04-11 NOTE — Progress Notes (Signed)
 Subjective:  Patient ID: [name] [name], male    DOB: 1960/09/02,  MRN: [id number]  Chief Complaint  Patient presents with   Wound Check    Transmetatarsal amputation of left foot. Right foot great toe Blood blister. Iodine  dressing on right. 0 pain. A1C 5.1      DOS: 03/25/24 Procedure: 1.  Transmetatarsal amputation of left foot 2.  Application of amniotic graft 5 x 5 single layer bio Vance, left foot  63 y.o. male seen for post op check.  Patient is now approximately 2.5 weeks out from above surgery.  At last visit I noted some necrosis along the transmet amputation site on the left foot as well as some worsening gangrenous changes to the right 1st and 2nd toes.  Referred to vascular.  He has appoint with them later today.  He has been doing Betadine wet-to-dry dressing on the right 1st and 2nd toes and has left the left foot clean dry and intact since last appointment.  Review of Systems: Negative except as noted in the HPI. Denies N/V/F/Ch.   Objective:   Constitutional Well developed. Well nourished.  Vascular Right 1st and 2nd toes are cold to touch.  Nonpalpable DP PT pulses bilaterally  Neurologic Normal speech. Oriented to person, place, and time. Epicritic sensation diminished to left foot  Dermatologic Amputation site  with some necrotic changes central and medial though slightly improved from prior with decreased erythema and decreased maceration    On the right foot the 1st and 2nd toes have gangrenous changes though appear more dry and stable than prior with decreased maceration      Orthopedic: S/p L foot TMA   Radiographs: Interval transmetatarsal amputation as described.   Pathology: A. FOOT, LEFT FOREFOOT, AMPUTATION:  - Skin with ulceration and underlying necrosis and inflammatory changes.  - Necroinflammatory changes extend to the soft tissue margin.  - Negative for active osteomyelitis.   Micro: L foot tissue at 2nd met head: MRSA  Assessment:    1. Gangrene of toe of right foot (HCC)   2. Gangrene of left foot (HCC)   3. PAD (peripheral artery disease)   4. Post-operative state     Osteomyelitis and gangrene with non healing 2nd/3rd toe amputation site of left foot s/p L foot transmetatarsal amputation   Plan:  Patient was evaluated and treated and all questions answered.  2.5 weeks s/p L foot TMA with graft application -Concern for necrosis along the left tma site, though slightly improved today from prior appears more stable.  Will continue with betadine wet to dry dressing, will monitor but if necrosis worsens may need revision to bka/aka level. He is aware of this risk.  - In regards to right hallux worsening gangrenous changes, this also appears more stable as dry gangrene today though the 1st and 2nd toes are very cold to touch concerned that they will not survive and he will require some level of amputation on the right foot.  Discussed this with him today.  High risk for TMA on the right as well -Appreciate vascular surgery plan for evaluation later today - Continue daily betadine wet to dry dressings on right hallux and 2nd toe -On the left foot leave the dressing intact for a week and then replace with a new Betadine gauze dressing until his appointment in 2 weeks -XR: expected changes -WB Status: NWB in post op shoe to left lower extremity.  He can be weightbearing as tolerated on the right foot in postop  shoe. -Sutures: remain intact until next appointment on the left side -Medications/ABX: Status post doxycycline  for mild cellulitis monitor off antibiotics at this time -Dressing: Replaced new betadine wet to dry dressing bilateral foot. - F/u Plan: Patient to follow-up in 2 week        Marolyn JULIANNA Honour, DPM [facility]

## 2024-04-12 DIAGNOSIS — Z992 Dependence on renal dialysis: Secondary | ICD-10-CM | POA: Diagnosis not present

## 2024-04-12 DIAGNOSIS — N186 End stage renal disease: Secondary | ICD-10-CM | POA: Diagnosis not present

## 2024-04-12 DIAGNOSIS — I120 Hypertensive chronic kidney disease with stage 5 chronic kidney disease or end stage renal disease: Secondary | ICD-10-CM | POA: Diagnosis not present

## 2024-04-15 DIAGNOSIS — N2581 Secondary hyperparathyroidism of renal origin: Secondary | ICD-10-CM | POA: Diagnosis not present

## 2024-04-15 DIAGNOSIS — D689 Coagulation defect, unspecified: Secondary | ICD-10-CM | POA: Diagnosis not present

## 2024-04-15 DIAGNOSIS — Z992 Dependence on renal dialysis: Secondary | ICD-10-CM | POA: Diagnosis not present

## 2024-04-15 DIAGNOSIS — Z4901 Encounter for fitting and adjustment of extracorporeal dialysis catheter: Secondary | ICD-10-CM | POA: Diagnosis not present

## 2024-04-16 ENCOUNTER — Ambulatory Visit (HOSPITAL_COMMUNITY)
Admission: RE | Admit: 2024-04-16 | Discharge: 2024-04-16 | Disposition: A | Discharge status: Home / Self Care | Attending: Surgery | Admitting: Surgery

## 2024-04-16 ENCOUNTER — Encounter (HOSPITAL_COMMUNITY)
Admission: RE | Disposition: A | Discharge status: Home / Self Care | Payer: Self-pay | Source: Home / Self Care | Attending: Surgery

## 2024-04-16 ENCOUNTER — Other Ambulatory Visit: Payer: Self-pay

## 2024-04-16 DIAGNOSIS — E11621 Type 2 diabetes mellitus with foot ulcer: Secondary | ICD-10-CM | POA: Diagnosis not present

## 2024-04-16 DIAGNOSIS — I70235 Atherosclerosis of native arteries of right leg with ulceration of other part of foot: Secondary | ICD-10-CM | POA: Diagnosis not present

## 2024-04-16 DIAGNOSIS — E1122 Type 2 diabetes mellitus with diabetic chronic kidney disease: Secondary | ICD-10-CM | POA: Insufficient documentation

## 2024-04-16 DIAGNOSIS — L97519 Non-pressure chronic ulcer of other part of right foot with unspecified severity: Secondary | ICD-10-CM | POA: Diagnosis not present

## 2024-04-16 DIAGNOSIS — Z992 Dependence on renal dialysis: Secondary | ICD-10-CM | POA: Diagnosis not present

## 2024-04-16 DIAGNOSIS — E1151 Type 2 diabetes mellitus with diabetic peripheral angiopathy without gangrene: Secondary | ICD-10-CM | POA: Diagnosis not present

## 2024-04-16 DIAGNOSIS — I120 Hypertensive chronic kidney disease with stage 5 chronic kidney disease or end stage renal disease: Secondary | ICD-10-CM | POA: Insufficient documentation

## 2024-04-16 DIAGNOSIS — N186 End stage renal disease: Secondary | ICD-10-CM | POA: Insufficient documentation

## 2024-04-16 DIAGNOSIS — Z79899 Other long term (current) drug therapy: Secondary | ICD-10-CM | POA: Diagnosis not present

## 2024-04-16 HISTORY — PX: ABDOMINAL AORTOGRAM: CATH118222

## 2024-04-16 HISTORY — PX: LOWER EXTREMITY INTERVENTION: CATH118252

## 2024-04-16 HISTORY — PX: LOWER EXTREMITY ANGIOGRAPHY: CATH118251

## 2024-04-16 LAB — POCT I-STAT, CHEM 8
BUN: 17 mg/dL (ref 8–23)
Calcium, Ion: 1.13 mmol/L — ABNORMAL LOW (ref 1.15–1.40)
Chloride: 98 mmol/L (ref 98–111)
Creatinine, Ser: 5.1 mg/dL — ABNORMAL HIGH (ref 0.61–1.24)
Glucose, Bld: 115 mg/dL — ABNORMAL HIGH (ref 70–99)
HCT: 29 % — ABNORMAL LOW (ref 39.0–52.0)
Hemoglobin: 9.9 g/dL — ABNORMAL LOW (ref 13.0–17.0)
Potassium: 4.5 mmol/L (ref 3.5–5.1)
Sodium: 138 mmol/L (ref 135–145)
TCO2: 30 mmol/L (ref 22–32)

## 2024-04-16 LAB — GLUCOSE, CAPILLARY: Glucose-Capillary: 103 mg/dL — ABNORMAL HIGH (ref 70–99)

## 2024-04-16 SURGERY — ABDOMINAL AORTOGRAM
Anesthesia: LOCAL

## 2024-04-16 MED ORDER — HEPARIN (PORCINE) IN NACL 1000-0.9 UT/500ML-% IV SOLN
INTRAVENOUS | Status: DC | PRN
  Administered 2024-04-16: 1000 mL

## 2024-04-16 MED ORDER — SODIUM CHLORIDE 0.9% FLUSH: 3.0000 mL | INTRAVENOUS | Status: DC | PRN

## 2024-04-16 MED ORDER — SODIUM CHLORIDE 0.9 % IV SOLN: INTRAVENOUS | Status: DC

## 2024-04-16 MED ORDER — SODIUM CHLORIDE 0.9% FLUSH: 3.0000 mL | Freq: Two times a day (BID) | INTRAVENOUS | Status: DC

## 2024-04-16 MED ORDER — IODIXANOL 320 MG/ML IV SOLN
INTRAVENOUS | Status: DC | PRN
  Administered 2024-04-16: 70 mL

## 2024-04-16 MED ORDER — MORPHINE SULFATE (PF) 2 MG/ML IV SOLN: 2.0000 mg | INTRAVENOUS | Status: DC | PRN

## 2024-04-16 MED ORDER — HYDRALAZINE HCL 20 MG/ML IJ SOLN: 5.0000 mg | INTRAMUSCULAR | Status: DC | PRN

## 2024-04-16 MED ORDER — ONDANSETRON HCL 4 MG/2ML IJ SOLN: 4.0000 mg | Freq: Four times a day (QID) | INTRAMUSCULAR | Status: DC | PRN

## 2024-04-16 MED ORDER — HEPARIN SODIUM (PORCINE) 1000 UNIT/ML IJ SOLN
INTRAMUSCULAR | Status: DC | PRN
  Administered 2024-04-16: 7000 [IU] via INTRAVENOUS

## 2024-04-16 MED ORDER — LABETALOL HCL 5 MG/ML IV SOLN: 10.0000 mg | INTRAVENOUS | Status: DC | PRN

## 2024-04-16 MED ORDER — FENTANYL CITRATE (PF) 100 MCG/2ML IJ SOLN
INTRAMUSCULAR | Status: DC | PRN
  Administered 2024-04-16: 50 ug via INTRAVENOUS

## 2024-04-16 MED ORDER — LIDOCAINE HCL (PF) 1 % IJ SOLN
INTRAMUSCULAR | Status: DC | PRN
  Administered 2024-04-16: 10 mL via INTRADERMAL

## 2024-04-16 MED ORDER — HEPARIN SODIUM (PORCINE) 1000 UNIT/ML IJ SOLN
INTRAMUSCULAR | Status: AC
  Filled 2024-04-16: qty 10

## 2024-04-16 MED ORDER — OXYCODONE HCL 5 MG PO TABS: 5.0000 mg | ORAL_TABLET | ORAL | Status: DC | PRN

## 2024-04-16 MED ORDER — LIDOCAINE HCL (PF) 1 % IJ SOLN
INTRAMUSCULAR | Status: AC
  Filled 2024-04-16: qty 30

## 2024-04-16 MED ORDER — FENTANYL CITRATE (PF) 100 MCG/2ML IJ SOLN
INTRAMUSCULAR | Status: AC
  Filled 2024-04-16: qty 2

## 2024-04-16 MED ORDER — MIDAZOLAM HCL (PF) 2 MG/2ML IJ SOLN
INTRAMUSCULAR | Status: DC | PRN
  Administered 2024-04-16: 2 mg via INTRAVENOUS

## 2024-04-16 MED ORDER — ACETAMINOPHEN 325 MG PO TABS: 650.0000 mg | ORAL_TABLET | ORAL | Status: DC | PRN

## 2024-04-16 MED ORDER — SODIUM CHLORIDE 0.9 % IV SOLN: 250.0000 mL | INTRAVENOUS | Status: DC | PRN

## 2024-04-16 MED ORDER — MIDAZOLAM HCL 2 MG/2ML IJ SOLN
INTRAMUSCULAR | Status: AC
  Filled 2024-04-16: qty 2

## 2024-04-16 SURGICAL SUPPLY — 14 items
BALLOON STRLNG SL OTW 2X80X150 (BALLOONS) IMPLANT
CATH OMNI FLUSH 5F 65CM (CATHETERS) IMPLANT
CATH QUICKCROSS .035X135CM (MICROCATHETER) IMPLANT
COVER DOME SNAP 22 D (MISCELLANEOUS) IMPLANT
DEVICE VASC CLSR CELT ART 5 (Vascular Products) IMPLANT
GLIDEWIRE ADV .035X260CM (WIRE) IMPLANT
KIT ENCORE 26 ADVANTAGE (KITS) IMPLANT
KIT MICROPUNCTURE NIT STIFF (SHEATH) IMPLANT
SET ATX-X65L (MISCELLANEOUS) IMPLANT
SHEATH CATAPULT 5FR 90 (SHEATH) IMPLANT
SHEATH PINNACLE 5F 10CM (SHEATH) IMPLANT
TRAY PV CATH (CUSTOM PROCEDURE TRAY) IMPLANT
WIRE BENTSON .035X145CM (WIRE) IMPLANT
WIRE G V18X300CM (WIRE) IMPLANT

## 2024-04-16 NOTE — Progress Notes (Signed)
 Discharge instructions reviewed with patient and mother at bedside. Denies questions concerns. PT tolerated PO intake. Patient performed bed movement mimicking walking due to non weight bearing status, performed this without complication. Incision site remains clean dry and intact. No s/s of complications. PT escorted from the unit via wheel chair to personal vehicle.

## 2024-04-16 NOTE — Discharge Instructions (Signed)
 Femoral Site Care The following information offers guidance on how to care for yourself after your procedure. Your health care provider may also give you more specific instructions. If you have problems or questions, contact your health care provider. What can I expect after the procedure? After the procedure, it is common to have bruising and tenderness at the incision site. This usually fades within 1-2 weeks. Follow these instructions at home: Incision site care  Follow instructions from your health care provider about how to take care of your incision site. Make sure you: Wash your hands with soap and water for at least 20 seconds before and after you change your bandage (dressing). If soap and water are not available, use hand sanitizer. Remove your dressing in 24 hours. Leave stitches (sutures), skin glue, or adhesive strips in place. These skin closures may need to stay in place for 2 weeks or longer. If adhesive strip edges start to loosen and curl up, you may trim the loose edges. Do not remove adhesive strips completely unless your health care provider tells you to do that. Do not take baths, swim, or use a hot tub for at least 1 week. You may shower 24 hours after the procedure or as told by your health care provider. Gently wash the incision site with plain soap and water. Pat the area dry with a clean towel. Do not rub the site. This may cause bleeding. Do not apply powder or lotion to the site. Keep the site clean and dry. Check your femoral site every day for signs of infection. Check for: Redness, swelling, or pain. Fluid or blood. Warmth. Pus or a bad smell. Activity If you were given a sedative during the procedure, it can affect you for several hours. Do not drive or operate machinery until your health care provider says that it is safe. Rest as told by your health care provider. Avoid sitting for a long time without moving. Get up to take short walks every 1-2 hours. This  is important to improve blood flow and breathing. Ask for help if you feel weak or unsteady. Return to your normal activities as told by your health care provider. Ask your health care provider what activities are safe for you and when you can return to work. Avoid activities that take a lot of effort for the first 2-3 days after your procedure, or as long as directed. Do not lift anything that is heavier than 10 lb (4.5 kg), or the limit that you are told, until your health care provider says that it is safe. General instructions Take over-the-counter and prescription medicines only as told by your health care provider. If you will be going home right after the procedure, plan to have a responsible adult care for you for the time you are told. This is important. Keep all follow-up visits. This is important. Contact a health care provider if: You have a fever or chills. You have any of these signs of infection at your incision site: Redness, swelling, or pain. Fluid or blood. Warmth. Pus or a bad smell. Get help right away if: The incision area swells very fast. The incision area is bleeding, and the bleeding does not stop when you hold steady pressure on the area. Your leg or foot becomes pale, cool, tingly, or numb. These symptoms may represent a serious problem that is an emergency. Do not wait to see if the symptoms will go away. Get medical help right away. Call your local emergency  services (911 in the [place] Do not drive yourself to the hospital. Summary After the procedure, it is common to have bruising and tenderness that fade within 1-2 weeks. Check your femoral site every day for signs of infection. Do not lift anything that is heavier than 10 lb (4.5 kg), or the limit that you are told, until your health care provider says that it is safe. Get help right away if the incision area swells very fast, you have bleeding at the incision area that does not stop, or your leg or foot  becomes pale, cool, or numb. This information is not intended to replace advice given to you by your health care provider. Make sure you discuss any questions you have with your health care provider. Document Revised: 02/17/2021 Document Reviewed: 07/19/2020 Elsevier Patient Education  2024 [employer].

## 2024-04-16 NOTE — Op Note (Signed)
 Patient name: [name] [name] MRN: [id number] DOB: Feb 15, 1961 Sex: male  04/16/2024 Pre-operative Diagnosis: Right foot ulcer Post-operative diagnosis:  Same Surgeon:  Malvina New Procedure Performed:  1.  Ultrasound-guided access, left femoral artery  2.  Aortobifemoral angiogram  3.  Right leg angiogram  4.  Selective injection with catheter in the right external iliac, right popliteal, right posterior tibial artery  5.  Balloon angioplasty right posterior tibial artery  6.  Conscious sedation, 65 minutes  7.  Closure device, Celt   Indications: This is a 63 year old gentleman with nonhealing wound on his right leg who comes in today for angiography  Procedure:  The patient was identified in the holding area and taken to room 8.  The patient was then placed supine on the table and prepped and draped in the usual sterile fashion.  A time out was called.  Conscious sedation was administered with the use of IV fentanyl  and Versed  under continuous physician and nurse monitoring.  Heart rate, blood pressure, and oxygen saturation were continuously monitored.  Total sedation time was 65 minutes.  Ultrasound was used to evaluate the left common femoral artery.  It was patent .  A digital ultrasound image was acquired.  A micropuncture needle was used to access the left common femoral artery under ultrasound guidance.  An 018 wire was advanced without resistance and a micropuncture sheath was placed.  The 018 wire was removed and a benson wire was placed.  The micropuncture sheath was exchanged for a 5 french sheath.  An omniflush catheter was advanced over the wire to the level of L-1.  An abdominal angiogram was obtained.  Next, using the omniflush catheter and a benson wire, the aortic bifurcation was crossed and the catheter was placed into theright external iliac artery and right runoff was obtained.  Findings:   Aortogram: No significant ostial renal artery stenosis visualized.  The  infrarenal abdominal aorta was widely patent.  Bilateral common and external iliac arteries widely patent.  Bilateral common femorals widely patent.  Right Lower Extremity: The right common femoral, profundofemoral, and superficial femoral artery are widely patent.  The popliteal artery is widely patent.  There is very sluggish migration of contrast throughout the leg.  There is single-vessel runoff via the posterior tibial artery which occludes at the ankle.  There were 2 areas of heavily calcified greater than 90% stenosis.  Diffuse disease is visualized out of the foot   Intervention: After the above images are acquired the decision was made to proceed with intervention.  A 5 French 90 cm sheath was advanced into the right popliteal artery.  Contrast injections were then performed at this level to better define the anatomy.  I then used a V-18 wire with the support of a 2 x 100 Sterling balloon to select the posterior tibial artery.  Selective injections with the balloon in the posterior tibial artery were performed.  There was a occlusion at the level of the ankle that I was unable to cross.  There were several greater than 90% heavily calcified stenosis that were treated with a 2 mm balloon.  Completion imaging shows inline flow through the posterior tibial artery down to the ankle where it occludes.  There is minimal perfusion out onto the foot.  Catheters and wires were removed.  The sheath was removed and closed with a Celt  Impression:  #1  No significant aortoiliac occlusive disease   #2  No significant right femoral-popliteal stenosis  #3  Severe tibial disease.  The posterior tibial artery is patent down to the ankle where it occludes.  There are several lesions that were treated with a 2 mm balloon.  I was unable to cross into the plantar arteries.  There is diffuse disease out onto the foot.  At this point the patient is maximally revascularized.     ALONSO Malvina New, M.D., [facility] [place] Office: [phone] Pager:  [phone]

## 2024-04-16 NOTE — Interval H&P Note (Signed)
 History and Physical Interval Note:  04/16/2024 2:05 PM  [name] [name]  has presented today for surgery, with the diagnosis of ischemia with tissue loss.  The various methods of treatment have been discussed with the patient and family. After consideration of risks, benefits and other options for treatment, the patient has consented to  Procedure(s): ABDOMINAL AORTOGRAM (N/A) Lower Extremity Angiography (N/A) LOWER EXTREMITY INTERVENTION (N/A) as a surgical intervention.  The patient's history has been reviewed, patient examined, no change in status, stable for surgery.  I have reviewed the patient's chart and labs.  Questions were answered to the patient's satisfaction.     Malvina New

## 2024-04-17 ENCOUNTER — Encounter (HOSPITAL_COMMUNITY): Payer: Self-pay | Admitting: Surgery

## 2024-04-18 ENCOUNTER — Ambulatory Visit

## 2024-04-18 ENCOUNTER — Ambulatory Visit (HOSPITAL_COMMUNITY)

## 2024-04-22 DIAGNOSIS — Z4901 Encounter for fitting and adjustment of extracorporeal dialysis catheter: Secondary | ICD-10-CM | POA: Diagnosis not present

## 2024-04-22 DIAGNOSIS — Z992 Dependence on renal dialysis: Secondary | ICD-10-CM | POA: Diagnosis not present

## 2024-04-22 DIAGNOSIS — N2581 Secondary hyperparathyroidism of renal origin: Secondary | ICD-10-CM | POA: Diagnosis not present

## 2024-04-22 DIAGNOSIS — D689 Coagulation defect, unspecified: Secondary | ICD-10-CM | POA: Diagnosis not present

## 2024-04-22 DIAGNOSIS — N186 End stage renal disease: Secondary | ICD-10-CM | POA: Diagnosis not present

## 2024-04-23 DIAGNOSIS — I5043 Acute on chronic combined systolic (congestive) and diastolic (congestive) heart failure: Secondary | ICD-10-CM | POA: Diagnosis not present

## 2024-04-23 DIAGNOSIS — E1122 Type 2 diabetes mellitus with diabetic chronic kidney disease: Secondary | ICD-10-CM | POA: Diagnosis not present

## 2024-04-23 DIAGNOSIS — I132 Hypertensive heart and chronic kidney disease with heart failure and with stage 5 chronic kidney disease, or end stage renal disease: Secondary | ICD-10-CM | POA: Diagnosis not present

## 2024-04-23 DIAGNOSIS — E1152 Type 2 diabetes mellitus with diabetic peripheral angiopathy with gangrene: Secondary | ICD-10-CM | POA: Diagnosis not present

## 2024-04-23 DIAGNOSIS — N186 End stage renal disease: Secondary | ICD-10-CM | POA: Diagnosis not present

## 2024-04-25 ENCOUNTER — Encounter (HOSPITAL_COMMUNITY): Payer: Self-pay

## 2024-04-25 ENCOUNTER — Encounter: Payer: Self-pay | Admitting: Podiatry

## 2024-04-25 ENCOUNTER — Ambulatory Visit: Admitting: Podiatry

## 2024-04-25 ENCOUNTER — Inpatient Hospital Stay (HOSPITAL_COMMUNITY)
Admission: RE | Admit: 2024-04-25 | Discharge: 2024-04-27 | DRG: 239 | Disposition: A | Discharge status: Home / Self Care | Source: Ambulatory Visit | Attending: Internal Medicine | Admitting: Internal Medicine

## 2024-04-25 ENCOUNTER — Other Ambulatory Visit: Payer: Self-pay

## 2024-04-25 ENCOUNTER — Encounter (HOSPITAL_COMMUNITY): Payer: Self-pay | Admitting: Podiatry

## 2024-04-25 VITALS — BP 120/62 | HR 68 | Temp 98.1°F

## 2024-04-25 DIAGNOSIS — M1990 Unspecified osteoarthritis, unspecified site: Secondary | ICD-10-CM | POA: Diagnosis not present

## 2024-04-25 DIAGNOSIS — Z8261 Family history of arthritis: Secondary | ICD-10-CM

## 2024-04-25 DIAGNOSIS — E785 Hyperlipidemia, unspecified: Secondary | ICD-10-CM | POA: Diagnosis present

## 2024-04-25 DIAGNOSIS — Z7901 Long term (current) use of anticoagulants: Secondary | ICD-10-CM | POA: Diagnosis not present

## 2024-04-25 DIAGNOSIS — I739 Peripheral vascular disease, unspecified: Secondary | ICD-10-CM | POA: Diagnosis not present

## 2024-04-25 DIAGNOSIS — I120 Hypertensive chronic kidney disease with stage 5 chronic kidney disease or end stage renal disease: Secondary | ICD-10-CM | POA: Diagnosis not present

## 2024-04-25 DIAGNOSIS — I5043 Acute on chronic combined systolic (congestive) and diastolic (congestive) heart failure: Secondary | ICD-10-CM | POA: Diagnosis not present

## 2024-04-25 DIAGNOSIS — M87874 Other osteonecrosis, right foot: Secondary | ICD-10-CM | POA: Diagnosis not present

## 2024-04-25 DIAGNOSIS — I132 Hypertensive heart and chronic kidney disease with heart failure and with stage 5 chronic kidney disease, or end stage renal disease: Secondary | ICD-10-CM | POA: Diagnosis not present

## 2024-04-25 DIAGNOSIS — I96 Gangrene, not elsewhere classified: Secondary | ICD-10-CM

## 2024-04-25 DIAGNOSIS — Z8249 Family history of ischemic heart disease and other diseases of the circulatory system: Secondary | ICD-10-CM

## 2024-04-25 DIAGNOSIS — I429 Cardiomyopathy, unspecified: Secondary | ICD-10-CM | POA: Diagnosis present

## 2024-04-25 DIAGNOSIS — Z79890 Hormone replacement therapy (postmenopausal): Secondary | ICD-10-CM

## 2024-04-25 DIAGNOSIS — K219 Gastro-esophageal reflux disease without esophagitis: Secondary | ICD-10-CM | POA: Diagnosis not present

## 2024-04-25 DIAGNOSIS — Z8042 Family history of malignant neoplasm of prostate: Secondary | ICD-10-CM | POA: Diagnosis not present

## 2024-04-25 DIAGNOSIS — I272 Pulmonary hypertension, unspecified: Secondary | ICD-10-CM | POA: Diagnosis present

## 2024-04-25 DIAGNOSIS — I70261 Atherosclerosis of native arteries of extremities with gangrene, right leg: Secondary | ICD-10-CM | POA: Diagnosis present

## 2024-04-25 DIAGNOSIS — Z79899 Other long term (current) drug therapy: Secondary | ICD-10-CM

## 2024-04-25 DIAGNOSIS — N2581 Secondary hyperparathyroidism of renal origin: Secondary | ICD-10-CM | POA: Diagnosis present

## 2024-04-25 DIAGNOSIS — Z992 Dependence on renal dialysis: Secondary | ICD-10-CM | POA: Diagnosis not present

## 2024-04-25 DIAGNOSIS — Z7902 Long term (current) use of antithrombotics/antiplatelets: Secondary | ICD-10-CM

## 2024-04-25 DIAGNOSIS — Z8614 Personal history of Methicillin resistant Staphylococcus aureus infection: Secondary | ICD-10-CM | POA: Diagnosis not present

## 2024-04-25 DIAGNOSIS — E039 Hypothyroidism, unspecified: Secondary | ICD-10-CM | POA: Diagnosis not present

## 2024-04-25 DIAGNOSIS — D631 Anemia in chronic kidney disease: Secondary | ICD-10-CM | POA: Diagnosis not present

## 2024-04-25 DIAGNOSIS — E1122 Type 2 diabetes mellitus with diabetic chronic kidney disease: Secondary | ICD-10-CM | POA: Diagnosis present

## 2024-04-25 DIAGNOSIS — Z9889 Other specified postprocedural states: Secondary | ICD-10-CM | POA: Diagnosis not present

## 2024-04-25 DIAGNOSIS — Z89421 Acquired absence of other right toe(s): Secondary | ICD-10-CM | POA: Diagnosis not present

## 2024-04-25 DIAGNOSIS — E1152 Type 2 diabetes mellitus with diabetic peripheral angiopathy with gangrene: Secondary | ICD-10-CM | POA: Diagnosis not present

## 2024-04-25 DIAGNOSIS — E8779 Other fluid overload: Secondary | ICD-10-CM | POA: Diagnosis not present

## 2024-04-25 DIAGNOSIS — L97414 Non-pressure chronic ulcer of right heel and midfoot with necrosis of bone: Secondary | ICD-10-CM | POA: Diagnosis not present

## 2024-04-25 DIAGNOSIS — Z833 Family history of diabetes mellitus: Secondary | ICD-10-CM

## 2024-04-25 DIAGNOSIS — N186 End stage renal disease: Secondary | ICD-10-CM | POA: Diagnosis not present

## 2024-04-25 LAB — CBC
HCT: 30.6 % — ABNORMAL LOW (ref 39.0–52.0)
Hemoglobin: 9.4 g/dL — ABNORMAL LOW (ref 13.0–17.0)
MCH: 29.3 pg (ref 26.0–34.0)
MCHC: 30.7 g/dL (ref 30.0–36.0)
MCV: 95.3 fL (ref 80.0–100.0)
Platelets: 165 K/uL (ref 150–400)
RBC: 3.21 MIL/uL — ABNORMAL LOW (ref 4.22–5.81)
RDW: 16.2 % — ABNORMAL HIGH (ref 11.5–15.5)
WBC: 7.1 K/uL (ref 4.0–10.5)
nRBC: 0 % (ref 0.0–0.2)

## 2024-04-25 LAB — COMPREHENSIVE METABOLIC PANEL WITH GFR
ALT: 10 U/L (ref 0–44)
AST: 19 U/L (ref 15–41)
Albumin: 2.2 g/dL — ABNORMAL LOW (ref 3.5–5.0)
Alkaline Phosphatase: 111 U/L (ref 38–126)
Anion gap: 12 (ref 5–15)
BUN: 10 mg/dL (ref 8–23)
CO2: 26 mmol/L (ref 22–32)
Calcium: 7.4 mg/dL — ABNORMAL LOW (ref 8.9–10.3)
Chloride: 99 mmol/L (ref 98–111)
Creatinine, Ser: 4.46 mg/dL — ABNORMAL HIGH (ref 0.61–1.24)
GFR, Estimated: 14 mL/min — ABNORMAL LOW (ref >60)
Glucose, Bld: 125 mg/dL — ABNORMAL HIGH (ref 70–99)
Potassium: 3.8 mmol/L (ref 3.5–5.1)
Sodium: 137 mmol/L (ref 135–145)
Total Bilirubin: 0.7 mg/dL (ref 0.0–1.2)
Total Protein: 6.2 g/dL — ABNORMAL LOW (ref 6.5–8.1)

## 2024-04-25 LAB — HEMOGLOBIN A1C
Hgb A1c MFr Bld: 4.6 % — ABNORMAL LOW (ref 4.8–5.6)
Mean Plasma Glucose: 85.32 mg/dL

## 2024-04-25 LAB — GLUCOSE, CAPILLARY: Glucose-Capillary: 166 mg/dL — ABNORMAL HIGH (ref 70–99)

## 2024-04-25 MED ORDER — CHLORHEXIDINE GLUCONATE CLOTH 2 % EX PADS
6.0000 | MEDICATED_PAD | Freq: Every day | CUTANEOUS | Status: DC
  Administered 2024-04-25: 6 via TOPICAL

## 2024-04-25 MED ORDER — CLOPIDOGREL BISULFATE 75 MG PO TABS
75.0000 mg | ORAL_TABLET | Freq: Every day | ORAL | Status: DC
  Administered 2024-04-26 – 2024-04-27 (×2): 75 mg via ORAL
  Filled 2024-04-25 (×2): qty 1

## 2024-04-25 MED ORDER — LEVOTHYROXINE SODIUM 50 MCG PO TABS
50.0000 ug | ORAL_TABLET | Freq: Every day | ORAL | Status: DC
  Administered 2024-04-26 – 2024-04-27 (×2): 50 ug via ORAL
  Filled 2024-04-25 (×2): qty 1

## 2024-04-25 MED ORDER — CHLORHEXIDINE GLUCONATE CLOTH 2 % EX PADS: 6.0000 | MEDICATED_PAD | Freq: Every day | CUTANEOUS | Status: DC

## 2024-04-25 MED ORDER — METOPROLOL TARTRATE 25 MG PO TABS
50.0000 mg | ORAL_TABLET | Freq: Two times a day (BID) | ORAL | Status: DC
  Administered 2024-04-25 – 2024-04-27 (×4): 50 mg via ORAL
  Filled 2024-04-25: qty 2
  Filled 2024-04-25: qty 4
  Filled 2024-04-25 (×2): qty 2

## 2024-04-25 MED ORDER — PANTOPRAZOLE SODIUM 40 MG PO TBEC
40.0000 mg | DELAYED_RELEASE_TABLET | Freq: Every day | ORAL | Status: DC
  Administered 2024-04-27: 40 mg via ORAL
  Filled 2024-04-25 (×2): qty 1

## 2024-04-25 MED ORDER — INSULIN ASPART 100 UNIT/ML IJ SOLN
0.0000 [IU] | Freq: Three times a day (TID) | INTRAMUSCULAR | Status: DC
  Administered 2024-04-26 – 2024-04-27 (×2): 1 [IU] via SUBCUTANEOUS
  Filled 2024-04-25 (×2): qty 1

## 2024-04-25 MED ORDER — GABAPENTIN 100 MG PO CAPS
100.0000 mg | ORAL_CAPSULE | Freq: Every day | ORAL | Status: DC
  Administered 2024-04-25 – 2024-04-26 (×2): 100 mg via ORAL
  Filled 2024-04-25 (×2): qty 1

## 2024-04-25 MED ORDER — SODIUM CHLORIDE 0.9% FLUSH
10.0000 mL | INTRAVENOUS | Status: DC | PRN
  Administered 2024-04-26: 10 mL

## 2024-04-25 MED ORDER — MUPIROCIN 2 % EX OINT
1.0000 | TOPICAL_OINTMENT | Freq: Two times a day (BID) | CUTANEOUS | Status: DC
  Administered 2024-04-25 – 2024-04-27 (×4): 1 via NASAL
  Filled 2024-04-25 (×3): qty 22

## 2024-04-25 MED ORDER — ATORVASTATIN CALCIUM 40 MG PO TABS
40.0000 mg | ORAL_TABLET | Freq: Every day | ORAL | Status: DC
  Administered 2024-04-26 – 2024-04-27 (×2): 40 mg via ORAL
  Filled 2024-04-25 (×2): qty 1

## 2024-04-25 MED ORDER — ASPIRIN 81 MG PO CHEW
81.0000 mg | CHEWABLE_TABLET | Freq: Every evening | ORAL | Status: DC
  Administered 2024-04-26: 81 mg via ORAL
  Filled 2024-04-25: qty 1

## 2024-04-25 MED ORDER — HEPARIN SODIUM (PORCINE) 5000 UNIT/ML IJ SOLN
5000.0000 [IU] | Freq: Three times a day (TID) | INTRAMUSCULAR | Status: DC
  Administered 2024-04-25 – 2024-04-27 (×5): 5000 [IU] via SUBCUTANEOUS
  Filled 2024-04-25 (×6): qty 1

## 2024-04-25 MED ORDER — INSULIN ASPART 100 UNIT/ML IJ SOLN
0.0000 [IU] | Freq: Every day | INTRAMUSCULAR | Status: DC
  Administered 2024-04-26: 2 [IU] via SUBCUTANEOUS
  Filled 2024-04-25: qty 2

## 2024-04-25 MED ORDER — SODIUM CHLORIDE 0.9% FLUSH
10.0000 mL | Freq: Two times a day (BID) | INTRAVENOUS | Status: DC
  Administered 2024-04-25 – 2024-04-27 (×2): 10 mL

## 2024-04-25 MED ORDER — AMLODIPINE BESYLATE 10 MG PO TABS
10.0000 mg | ORAL_TABLET | Freq: Every morning | ORAL | Status: DC
  Administered 2024-04-27: 10 mg via ORAL
  Filled 2024-04-25 (×2): qty 1

## 2024-04-25 MED ORDER — AMITRIPTYLINE HCL 10 MG PO TABS
10.0000 mg | ORAL_TABLET | Freq: Every day | ORAL | Status: DC
  Administered 2024-04-25 – 2024-04-26 (×2): 10 mg via ORAL
  Filled 2024-04-25 (×3): qty 1

## 2024-04-25 MED ORDER — ACETAMINOPHEN 325 MG PO TABS
650.0000 mg | ORAL_TABLET | Freq: Four times a day (QID) | ORAL | Status: DC | PRN
  Administered 2024-04-26: 650 mg via ORAL
  Filled 2024-04-25: qty 2

## 2024-04-25 MED ORDER — ACETAMINOPHEN 650 MG RE SUPP: 650.0000 mg | Freq: Four times a day (QID) | RECTAL | Status: DC | PRN

## 2024-04-25 MED ORDER — GUAIFENESIN-DM 100-10 MG/5ML PO SYRP
5.0000 mL | ORAL_SOLUTION | ORAL | Status: DC | PRN
  Administered 2024-04-26 (×2): 5 mL via ORAL
  Filled 2024-04-25 (×3): qty 5

## 2024-04-25 MED ORDER — NEPRO/CARBSTEADY PO LIQD
237.0000 mL | Freq: Two times a day (BID) | ORAL | Status: DC
  Administered 2024-04-27: 237 mL via ORAL

## 2024-04-25 MED ORDER — CALCIUM ACETATE (PHOS BINDER) 667 MG PO CAPS
667.0000 mg | ORAL_CAPSULE | Freq: Three times a day (TID) | ORAL | Status: DC
  Administered 2024-04-26 – 2024-04-27 (×3): 667 mg via ORAL
  Filled 2024-04-25 (×3): qty 1

## 2024-04-25 NOTE — Progress Notes (Signed)
 Spoke with pt's mom. Most of SDW call was done before pt's mother informed me that the pt is supposed to be directly admitted this afternoon.

## 2024-04-25 NOTE — Anesthesia Preprocedure Evaluation (Addendum)
 Anesthesia Evaluation  Patient identified by MRN, date of birth, ID band Patient awake    Reviewed: Allergy & Precautions, NPO status , Patient's Chart, lab work & pertinent test results, reviewed documented beta blocker date and time   History of Anesthesia Complications Negative for: history of anesthetic complications  Airway Mallampati: III  TM Distance: >3 FB Neck ROM: Full    Dental no notable dental hx. (+) Dental Advisory Given, Poor Dentition, Chipped,    Pulmonary neg pulmonary ROS   Pulmonary exam normal breath sounds clear to auscultation       Cardiovascular hypertension, Pt. on medications and Pt. on home beta blockers pulmonary hypertension+ Peripheral Vascular Disease and +CHF  Normal cardiovascular exam Rhythm:Regular Rate:Normal  TTE 2025 1. Left ventricular ejection fraction, by estimation, is 50 to 55%. The  left ventricle has low normal function. The left ventricle has no regional  wall motion abnormalities. There is mild left ventricular hypertrophy.  Left ventricular diastolic  parameters are consistent with Grade II diastolic dysfunction  (pseudonormalization). Elevated left atrial pressure. The average left  ventricular global longitudinal strain is -19.2 %.   2. Right ventricular systolic function is normal. The right ventricular  size is normal. Tricuspid regurgitation signal is inadequate for assessing  PA pressure.   3. Left atrial size was mildly dilated.   4. The mitral valve is abnormal. Moderate leaflet thickening. Trivial  mitral valve regurgitation.   5. The aortic valve is tricuspid. Aortic valve regurgitation is not  visualized. Aortic valve sclerosis/calcification is present, without any  evidence of aortic stenosis.   6. The inferior vena cava is normal in size with greater than 50%  respiratory variability, suggesting right atrial pressure of 3 mmHg.     Neuro/Psych negative  neurological ROS  negative psych ROS   GI/Hepatic Neg liver ROS,GERD  ,,  Endo/Other  diabetes, Type 2Hypothyroidism    Renal/GU Dialysis and ESRFRenal disease (dialysis MWF, last dialysis Friday)MWF hemodialysis Lab Results      Component                Value               Date                      NA                       137                 04/25/2024                CL                       99                  04/25/2024                K                        3.8                 04/25/2024                CO2                      26  04/25/2024                BUN                      10                  04/25/2024                CREATININE               4.46 (H)            04/25/2024                GFRNONAA                 14 (L)              04/25/2024                CALCIUM                   7.4 (L)             04/25/2024                     ALBUMIN                   2.2 (L)             04/25/2024                GLUCOSE                  125 (H)             04/25/2024             negative genitourinary   Musculoskeletal  (+) Arthritis ,    Abdominal   Peds  Hematology  (+) Blood dyscrasia (plavix ), anemia Lab Results      Component                Value               Date                      WBC                      7.1                 04/25/2024                HGB                      9.4 (L)             04/25/2024                HCT                      30.6 (L)            04/25/2024                MCV                      95.3                04/25/2024  PLT                      165                 04/25/2024                        Anesthesia Other Findings 63 y.o. male with medical history significant of hypertension, hyperlipidemia, diabetes mellitus type 2, ESRD on HD, hypothyroidism who presents with post-operative foot pain. 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 Patient noted that surgical site  wound had opened up and he had redness present on the forefoot  Reproductive/Obstetrics                              Anesthesia Physical Anesthesia Plan  ASA: 3  Anesthesia Plan: MAC and Regional   Post-op Pain Management: Tylenol  PO (pre-op )* and Regional block*   Induction: Intravenous  PONV Risk Score and Plan: 1 and Propofol  infusion, Treatment may vary due to age or medical condition, Ondansetron  and Midazolam   Airway Management Planned: Natural Airway  Additional Equipment: None  Intra-op Plan:   Post-operative Plan:   Informed Consent: I have reviewed the patients History and Physical, chart, labs and discussed the procedure including the risks, benefits and alternatives for the proposed anesthesia with the patient or authorized representative who has indicated his/her understanding and acceptance.     Dental advisory given  Plan Discussed with: CRNA  Anesthesia Plan Comments: (R Pop/ saphenous  + Mac)         Anesthesia Quick Evaluation

## 2024-04-25 NOTE — Progress Notes (Signed)
 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   Patient was admitted 9/23 through 03/29/2023 for MRSA and Enterococcus bacteremia, mitral valve endocarditis, vegetation on TDC.  There is also concern for infection of left AV graft; this was excised on 10/2.  TEE 10/7 showed small linear 2.3 mm mobile vegetation attached to the lateral commissure of the P1 segment of the mitral valve consistent with vegetation.  Dialysis catheter tip in the right atrium with 6.4 x 2.8 mm mobile mass consistent with vegetation. After holiday line after removal on 10/8, replaced TDC on 10/12 by vascular after BC remained NGTD.  He completed vancomycin  therapy on 05/02/2023.  He had follow-up echo on 06/29/2023 which showed EF 50 to 55%, normal wall motion, grade 2 DD, normal RV function, no significant valvular abnormalities.   Underwent insertion of right upper arm AV graft on 08/17/2023.  This subsequently clotted and he underwent successful declotting with stenting of venous anastomosis by Dr. Pearline on 12/20/2023.  However, this clotted again and he had right IJ TDC placed on 01/10/2024.   He has been followed by podiatry as well as vascular surgery for left foot ulcers.  Recently underwent balloon angioplasty of the left anterior tibial artery 01/30/2024 by Dr. Serene.   He was seen in follow-up by Dr. Gershon on 02/06/2024 for gangrene of the left foot.  He was recommended to undergo amputation of the affected toes.  He did subsequently undergo amputation of left 2nd and 3rd toes on 02/15/2024.  He was then admitted 10/7 through 03/28/2024 with postoperative wound infection.  Underwent LLE angiogram on 10/8 by vascular surgery; no intervenable targets.   Had left TMA by podiatry on 10/13.   Seen in outpatient follow-up with vascular surgeon Dr. Lanis on 04/11/2024 and noted to have rapidly progressive right lower extremity necrosis.  Angiogram 04/16/2024 showed severe tibial disease, several lesions were treated with a 2 mm balloon, unable to cross into the plantar arteries, diffuse disease onto the foot, no further targets for revascularization.  Seen by podiatry on 04/25/2024 and noted to have worsening gangrenous changes of the right foot.  Right TMA recommended.  Patient will need day of surgery labs and evaluation   EKG 03/25/2024: Normal sinus rhythm.  Rate 65.  Left ventricular hypertrophy. Nonspecific ST and T wave abnormality. Prolonged QT (QTc 507)   TTE 06/29/2023:  1. Left ventricular ejection fraction, by estimation, is 50 to 55%. The  left ventricle has low normal function. The left ventricle has no regional  wall motion abnormalities. There is mild left ventricular hypertrophy.  Left ventricular diastolic  parameters are consistent with Grade II diastolic dysfunction  (pseudonormalization). Elevated left atrial pressure. The average left  ventricular global longitudinal strain is -19.2 %.   2. Right ventricular systolic function is normal. The right ventricular  size is normal. Tricuspid regurgitation signal is inadequate for assessing  PA pressure.   3. Left atrial size was mildly dilated.   4. The mitral valve is abnormal. Moderate leaflet thickening. Trivial  mitral valve regurgitation.   5. The aortic valve is tricuspid. Aortic valve regurgitation is not  visualized. Aortic valve sclerosis/calcification is present, without any  evidence of aortic stenosis.   6. The inferior vena cava is normal in size with greater  than 50%  respiratory variability, suggesting right atrial pressure of 3 mmHg.         Lynwood Geofm RIGGERS [facility] Short Stay Center/Anesthesiology Phone [phone] 04/25/2024 2:04 PM

## 2024-04-25 NOTE — H&P (Signed)
 History and Physical    Patient: [name] [name] FMW:[id number] DOB: 12-21-1960 DOA: 04/25/2024 DOS: the patient was seen and examined on 04/25/2024 PCP: Kip Righter, MD  Patient coming from: Home  Chief Complaint: No chief complaint on file.  HPI: [name] [name] is a 63 y.o. male with medical history significant of DM, HTN, HLD, ESRD on MWF HD, PAD with prior L transmetatarsal amputation who presents from [facility] with non-healing gangrenous R foot and after failing trial of abx. Pt was referred to [facility] for direct admission for R foot TMA 11/14.   When seen, pt denies sob or chest pains. No fevers or chills. Last HD 11/12 and completed entire session.  Review of Systems: As mentioned in the history of present illness. All other systems reviewed and are negative. Past Medical History:  Diagnosis Date   Anemia    Arthritis    Cardiomyopathy [facility])    July 2022   Diabetes mellitus [facility])    Dialysis patient    HD MWF   GERD (gastroesophageal reflux disease)    HTN (hypertension)    Hyperlipidemia    Hypothyroidism    Nephrotic syndrome    HD MWF   Obesity    Peripheral vascular disease    Pulmonary hypertension (HCC)    July 2022 in setting of nephrotic syndrome   Secondary hyperparathyroidism of renal origin    Past Surgical History:  Procedure Laterality Date   ABDOMINAL AORTOGRAM N/A 04/16/2024   Procedure: ABDOMINAL AORTOGRAM;  Surgeon: Serene Gaile ORN, MD;  [facility]: [facility] INVASIVE CV LAB;  Service: Cardiovascular;  Laterality: N/A;   ABDOMINAL AORTOGRAM W/LOWER EXTREMITY N/A 01/30/2024   Procedure: ABDOMINAL AORTOGRAM W/LOWER EXTREMITY;  Surgeon: Serene Gaile ORN, MD;  Location: [facility] INVASIVE CV LAB;  Service: Cardiovascular;  Laterality: N/A;   ABDOMINAL AORTOGRAM W/LOWER EXTREMITY N/A 03/20/2024   Procedure: ABDOMINAL AORTOGRAM W/LOWER EXTREMITY;  Surgeon: Lanis Fonda BRAVO, MD;  Location: [facility] INVASIVE CV LAB;  Service: Cardiovascular;  Laterality: N/A;   AMPUTATION  Left 03/25/2024   Procedure: AMPUTATION, FOOT, RAY LEFT;  Surgeon: Malvin Marsa FALCON, DPM;  Location: [facility] OR;  Service: Orthopedics/Podiatry;  Laterality: Left;  Tranmetarsal amputation   AMPUTATION TOE Left 02/14/2024   Procedure: AMPUTATION, TOE LEFT SECOND AND THIRD TOES;  Surgeon: Gershon Donnice SAUNDERS, DPM;  Location: [facility] OR;  Service: Orthopedics/Podiatry;  Laterality: Left;   AV FISTULA PLACEMENT Left 12/27/2022   Procedure: INSERTION OF LEFT ARM ARTERIOVENOUS (AV) GORE-TEX GRAFT;  Surgeon: Eliza Lonni RAMAN, MD;  Location: [facility] OR;  Service: Vascular;  Laterality: Left;   AV FISTULA PLACEMENT Right 08/17/2023   Procedure: INSERTION OF ARTERIOVENOUS (AV) GORE-TEX GRAFT RIGHT ARM USING 4-67mm GORETEX STRETCH GRAFT;  Surgeon: Magda Debby SAILOR, MD;  Location: [facility] OR;  Service: Vascular;  Laterality: Right;   AVGG REMOVAL Left 03/15/2023   Procedure: REMOVAL OF LEFT UPPER ARM ARTERIOVENOUS GORETEX GRAFT (AVGG);  Surgeon: Magda Debby SAILOR, MD;  Location: [facility] OR;  Service: Vascular;  Laterality: Left;   BIOPSY  01/13/2023   Procedure: BIOPSY;  Surgeon: Cinderella Deatrice FALCON, MD;  Location: [facility] ENDO SUITE;  Service: Endoscopy;;   CATARACT EXTRACTION, BILATERAL  2018   COLONOSCOPY WITH PROPOFOL  N/A 01/13/2023   Procedure: COLONOSCOPY WITH PROPOFOL ;  Surgeon: Cinderella Deatrice FALCON, MD;  Location: [facility] ENDO SUITE;  Service: Endoscopy;  Laterality: N/A;   DIALYSIS/PERMA CATHETER INSERTION Right 01/10/2024   Procedure: DIALYSIS/PERMA CATHETER INSERTION;  Surgeon: Gretta Lonni PARAS, MD;  Location: [facility] PV LAB;  Service: Cardiovascular;  Laterality: Right;   INSERTION OF DIALYSIS CATHETER Right 12/27/2022   Procedure: INSERTION OF Right Internal Jugular  DIALYSIS CATHETER;  Surgeon: Eliza Lonni RAMAN, MD;  Location: [facility] OR;  Service: Vascular;  Laterality: Right;   INSERTION OF DIALYSIS CATHETER N/A 03/15/2023   Procedure: INSERTION OF TUNNELED DIALYSIS CATHETER;  Surgeon: Magda Debby SAILOR, MD;  [facility]  OR;  Service: Vascular;  Laterality: N/A;   INSERTION OF DIALYSIS CATHETER Right 03/25/2023   Procedure: INSERTION OF RIGHT TUNNELED DIALYSIS CATHETER;  Surgeon: Pearline Norman RAMAN, MD;  Location: [facility] OR;  Service: Vascular;  Laterality: Right;   LOWER EXTREMITY ANGIOGRAPHY N/A 04/16/2024   Procedure: Lower Extremity Angiography;  Surgeon: Serene Gaile ORN, MD;  [facility] IN[facility];  Service: Cardiovascular;  Laterality: N/A;   LOWER EXTREMITY INTERVENTION  01/30/2024   Procedure: LOWER EXTREMITY INTERVENTION;  Surgeon: Serene Gaile ORN, MD;  [facility];  Service: Cardiovascular;;   LOWER EXTREMITY INTERVENTION N/A 04/16/2024   Procedure: LOWER EXTREMITY INTERVENTION;  Surgeon: Serene Gaile ORN, MD;  [facility];  Service: Cardiovascular;  Laterality: N/A;   PERIPHERAL INTRAVASCULAR LITHOTRIPSY  01/30/2024   Procedure: PERIPHERAL INTRAVASCULAR LITHOTRIPSY;  Surgeon: Serene Gaile ORN, MD;  [facility];  Service: Cardiovascular;;   PERIPHERAL VASCULAR ATHERECTOMY  01/30/2024   Procedure: PERIPHERAL VASCULAR ATHERECTOMY;  Surgeon: Serene Gaile ORN, MD;  [facility];  Service: Cardiovascular;;   PERIPHERAL VASCULAR THROMBECTOMY Right 12/20/2023   Procedure: PERIPHERAL VASCULAR THROMBECTOMY;  Surgeon: Pearline Norman RAMAN, MD;  Location: [facility] LAB;  Service: Cardiovascular;  Laterality: Right;   TEE WITHOUT CARDIOVERSION N/A 03/20/2023   Procedure: TRANSESOPHAGEAL ECHOCARDIOGRAM;  Surgeon: Barbaraann Darryle Debby, MD;  Location: [facility] OR;  Service: Cardiovascular;  Laterality: N/A;   TONSILLECTOMY     VENOUS ANGIOPLASTY  12/20/2023   Procedure: VENOUS ANGIOPLASTY;  Surgeon: Pearline Norman RAMAN, MD;  Location: [facility] LAB;  Service: Cardiovascular;;   VENOUS STENT  12/20/2023   Procedure: VENOUS STENT;  Surgeon: Pearline Norman RAMAN, MD;  Location: [facility] LAB;  Service: Cardiovascular;;  outflow   Social History:  reports that he has never smoked. He  has never used smokeless tobacco. He reports that he does not drink alcohol and does not use drugs.  No Known Allergies  Family History  Problem Relation Age of Onset   Hypertension Mother    Diabetes Mother    Cancer Father        prostate cancer   Hypertension Maternal Grandfather    Rheum arthritis Maternal Grandmother    CAD Neg Hx     Prior to Admission medications   Medication Sig Start Date End Date Taking? Authorizing Provider  acetaminophen  (TYLENOL ) 500 MG tablet Take 500-1,000 mg by mouth every 8 (eight) hours as needed for moderate pain (pain score 4-6).   Yes [provider]  amitriptyline  (ELAVIL ) 10 MG tablet Take 10 mg by mouth at bedtime.   Yes [provider]  amLODipine  (NORVASC ) 10 MG tablet Take 1 tablet (10 mg total) by mouth every morning. 03/29/23  Yes Regalado, Belkys A, MD  aspirin  (ASPIRIN  CHILDRENS) 81 MG chewable tablet Chew 1 tablet (81 mg total) by mouth daily. Patient taking differently: Chew 81 mg by mouth every evening. 01/30/24 01/29/25 Yes Serene Gaile ORN, MD  atorvastatin  (LIPITOR) 40 MG tablet Take 1 tablet (40 mg total) by mouth daily. 03/28/24  Yes Kathrin Mignon DASEN, MD  calcium  acetate (PHOSLO) [phone]  MG capsule Take 667 mg by mouth 3 (three) times daily with meals. 07/07/23 06/27/24 Yes [provider]  clopidogrel  (PLAVIX ) 75 MG tablet Take 1 tablet (75 mg total) by mouth daily. 01/30/24  Yes Serene Gaile ORN, MD  Cyanocobalamin  (VITAMIN B12 PO) Take 1 tablet by mouth at bedtime.   Yes [provider]  gabapentin  (NEURONTIN ) 100 MG capsule Take 100 mg by mouth at bedtime. 07/05/23  Yes [provider]  levothyroxine  (SYNTHROID ) 50 MCG tablet Take 50 mcg by mouth daily before breakfast.   Yes [provider]  metoprolol  tartrate (LOPRESSOR ) 50 MG tablet Take 1 tablet (50 mg total) by mouth 2 (two) times daily. 06/16/20  Yes Comer Kirsch, PA-C  Multiple Vitamin (MULTIVITAMIN WITH MINERALS) TABS tablet  Take 1 tablet by mouth daily. Men's Multivitamin for 50+   Yes [provider]  pantoprazole  (PROTONIX ) 40 MG tablet Take 1 tablet (40 mg total) by mouth daily. 30 minutes before breakfast 06/01/23  Yes Shirlean Therisa ORN, NP  Probiotic Product (PROBIOTIC DAILY PO) Take 1 capsule by mouth at bedtime.   Yes [provider]  Senna (SENOKOT LAXATIVE GUMMIES) 8.7 MG CHEW Chew 1-4 each by mouth daily as needed (constipation.).   Yes [provider]  Accu-Chek Softclix Lancets lancets  08/06/20   [provider]    Physical Exam: Vitals:   04/25/24 1559 04/25/24 1650  BP: 134/70   Pulse: 74   Resp: 18   Temp: 98 F (36.7 C)   TempSrc: Oral   SpO2: 98%   Height:  5' 10 (1.778 m)   General exam: Awake, laying in bed, in nad Respiratory system: Normal respiratory effort, no audible wheezing Cardiovascular system: regular rate, s1, s2 Gastrointestinal system: Soft, nondistended Central nervous system: CN2-12 grossly intact, strength intact Extremities: Perfused, B feet wrapped in dressings Skin: Normal skin turgor, no notable rashes Psychiatry: Mood normal // affect normal   Data Reviewed:  Results are pending, will review when available.  Assessment and Plan: Gangrene of R foot -direct admit by Podiatry -Currently hemodynamically stable, afebrile. Hold off on further abx -Completed outpt course of abx without improvement  -Podiatry recs for R TMA 11/14. Will make NPO after midnight -held ASA and plavix  unless OK by Podiatry  PAD -Followed by Vascular Surgery -on ASA and plavix  PTA  ESRD on HD MWF:  Last HD on 11/12, reportedly completed entire session -Will check CMP -Plan to consult Nephrology while here   Anemia of chronic disease:  -hemodynamically stable -Will check CBC   Essential hypertension:  -BP currently stable -Cont home meds   Controlled IDDM-2:  -continue SSI as needed   Hypothyroidism: -Continue levothyroxine  per home  regimen   Hyperlipidemia: -Continue atorvastatin  per home regimen   GERD: -Continue PPI.    Advance Care Planning:   Code Status: Full Code Full  Consults: Podiatry, Nephrology  Family Communication: Pt in room, family at bedside  Severity of Illness: The appropriate patient status for this patient is INPATIENT. Inpatient status is judged to be reasonable and necessary in order to provide the required intensity of service to ensure the patient's safety. The patient's presenting symptoms, physical exam findings, and initial radiographic and laboratory data in the context of their chronic comorbidities is felt to place them at high risk for further clinical deterioration. Furthermore, it is not anticipated that the patient will be medically stable for discharge from the hospital within 2 midnights of admission.   * I certify that  at the point of admission it is my clinical judgment that the patient will require inpatient hospital care spanning beyond 2 midnights from the point of admission due to high intensity of service, high risk for further deterioration and high frequency of surveillance required.*  Author: Garnette Pelt, MD 04/25/2024 4:54 PM  For on call review [email or web address].

## 2024-04-25 NOTE — Progress Notes (Signed)
------------------------------------------  [facility] PROCEDURAL EXPEDITER NOTE-------------------------------------------------  Patient Name: [name] [name] Patient DOB: 1960/09/28 Today's Date: @TODAY @   Chart reviewed:  Yes  Documentation gaps: IB sent  for orders for pre op Orders in place:  No  Communication with surgical team if no orders: pt is direct admit to hospital day before surgery  Labs, test, and orders reviewed: yes Requires surgical clearance:  No What type of clearance: n/a Clearance received: n/a Patient status: direct admit to hospital   Barriers noted:n/a  Intervention provided by [facility] team: reached out to Dr Malvin for orders  Barrier resolved:  Yes   Ronal Bald, RN [facility]

## 2024-04-25 NOTE — Progress Notes (Signed)
 Subjective:  Patient ID: TABIUS ROOD, male    DOB: [birth year],  MRN: [id number]  Chief Complaint  Patient presents with   Routine Post Op     Transmetatarsal amputation of left foot. Right foot toe 1-3 black discoloration. 9 pain. Iodine  dressing on right.  A1C 5.1  Had veins in right leg cleaned out 2 weeks ago.     DOS: 03/25/24 Procedure: 1.  Transmetatarsal amputation of left foot 2.  Application of amniotic graft 5 x 5 single layer bio Vance, left foot  63 y.o. male seen for post op check.  Patient is now approximately 4 weeks out from left foot TMA.  Has worsening gangrenous changes to the right 1st and 2nd toes as well.  Was referred to vascular.  He underwent angiogram on 04/16/2024.   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  Patient reports increasing pain on the right foot.  Has been doing Betadine dressing on the right.  Review of Systems: Negative except as noted in the HPI. Denies N/V/F/Ch.   Objective:   Constitutional Well developed. Well nourished.  Vascular Right 1st and 2nd toes are cold to touch.  Nonpalpable DP PT pulses bilaterally  Neurologic Normal speech. Oriented to person, place, and time. Epicritic sensation diminished to left foot  Dermatologic Amputation site  with some necrotic changes central and medial though slightly improved from prior with decreased erythema and decreased maceration    On the right foot the 1st and 2nd toes have gangrenous changes and now increasing gangrenous changes to the third toe as well.  Decreased maceration more dry stable gangrene          Orthopedic: S/p L foot TMA   Radiographs: Interval transmetatarsal amputation as described.   Pathology: A. FOOT, LEFT FOREFOOT, AMPUTATION:  - Skin with ulceration and underlying necrosis and inflammatory changes.  - Necroinflammatory changes extend to the soft tissue margin.  - Negative for active osteomyelitis.    Micro: L foot tissue at 2nd met head: MRSA  Assessment:   1. Gangrene of toe of right foot (HCC)   2. Gangrene of left foot (HCC)   3. PAD (peripheral artery disease)   4. Post-operative state      Osteomyelitis and gangrene with non healing 2nd/3rd toe amputation site of left foot s/p L foot transmetatarsal amputation   Plan:  Patient was evaluated and treated and all questions answered.  For weeks s/p L foot TMA with graft application - Left foot TMA appears stable at this point in time recommend continued monitoring with Betadine wet-to-dry dressings.  -Right foot gangrene is worse from prior and the first second and now the third toe affected.  Patient is status post angiogram with angioplasty on the right lower extremity last week.  Appreciate vascular intervention to assist with optimizing healing potential on right foot - Discussed with patient at this time I do recommend intervention on the right foot as I am concerned about the worsening gangrene on the right foot.  Do not feel the first second or even the third toe will survive.  His best option would be a transmetatarsal amputation.  I will discuss further with vascular surgery re: the level of amputation. - I recommended direct admission to [facility] today for surgery tomorrow for right foot TMA.  Fortunately the left foot is improving and healing he can now begin to put weight on that side but he will have to  be nonweightbearing of the right foot - Recommend every other day dressing change to the left foot with Betadine and gauze dressing -XR: expected changes -WB Status: Weightbearing as tolerated postop shoe on the left side. -Sutures: Removed in total from left foot today -Medications/ABX: Status post doxycycline  for mild cellulitis monitor off antibiotics at this time -Dressing: Replaced new betadine wet to dry dressing bilateral foot. - F/u Plan: Patient to [facility] for surgery tomorrow n.p.o.  past midnight        Marolyn JULIANNA Honour, DPM [facility]

## 2024-04-25 NOTE — Plan of Care (Signed)
 Problem: Education: Goal: Knowledge of General Education information will improve Description: Including pain rating scale, medication(s)/side effects and non-pharmacologic comfort measures 04/25/2024 1926 by Wilfred Palau, RN Outcome: Progressing 04/25/2024 1925 by Wilfred Palau, RN Outcome: Progressing   Problem: Health Behavior/Discharge Planning: Goal: Ability to manage health-related needs will improve 04/25/2024 1926 by Wilfred Palau, RN Outcome: Progressing 04/25/2024 1925 by Wilfred Palau, RN Outcome: Progressing   Problem: Clinical Measurements: Goal: Ability to maintain clinical measurements within normal limits will improve 04/25/2024 1926 by Wilfred Palau, RN Outcome: Progressing 04/25/2024 1925 by Wilfred Palau, RN Outcome: Progressing Goal: Will remain free from infection 04/25/2024 1926 by Wilfred Palau, RN Outcome: Progressing 04/25/2024 1925 by Wilfred Palau, RN Outcome: Progressing Goal: Diagnostic test results will improve 04/25/2024 1926 by Wilfred Palau, RN Outcome: Progressing 04/25/2024 1925 by Wilfred Palau, RN Outcome: Progressing Goal: Respiratory complications will improve 04/25/2024 1926 by Wilfred Palau, RN Outcome: Progressing 04/25/2024 1925 by Wilfred Palau, RN Outcome: Progressing Goal: Cardiovascular complication will be avoided 04/25/2024 1926 by Wilfred Palau, RN Outcome: Progressing 04/25/2024 1925 by Wilfred Palau, RN Outcome: Progressing   Problem: Activity: Goal: Risk for activity intolerance will decrease 04/25/2024 1926 by Wilfred Palau, RN Outcome: Progressing 04/25/2024 1925 by Wilfred Palau, RN Outcome: Progressing   Problem: Nutrition: Goal: Adequate nutrition will be maintained 04/25/2024 1926 by Wilfred Palau, RN Outcome: Progressing 04/25/2024 1925 by Wilfred Palau, RN Outcome: Progressing   Problem: Coping: Goal: Level of anxiety will decrease 04/25/2024  1926 by Wilfred Palau, RN Outcome: Progressing 04/25/2024 1925 by Wilfred Palau, RN Outcome: Progressing   Problem: Elimination: Goal: Will not experience complications related to bowel motility 04/25/2024 1926 by Wilfred Palau, RN Outcome: Progressing 04/25/2024 1925 by Wilfred Palau, RN Outcome: Progressing Goal: Will not experience complications related to urinary retention 04/25/2024 1926 by Wilfred Palau, RN Outcome: Progressing 04/25/2024 1925 by Wilfred Palau, RN Outcome: Progressing   Problem: Pain Managment: Goal: General experience of comfort will improve and/or be controlled 04/25/2024 1926 by Wilfred Palau, RN Outcome: Progressing 04/25/2024 1925 by Wilfred Palau, RN Outcome: Progressing   Problem: Safety: Goal: Ability to remain free from injury will improve 04/25/2024 1926 by Wilfred Palau, RN Outcome: Progressing 04/25/2024 1925 by Wilfred Palau, RN Outcome: Progressing   Problem: Skin Integrity: Goal: Risk for impaired skin integrity will decrease 04/25/2024 1926 by Wilfred Palau, RN Outcome: Progressing 04/25/2024 1925 by Wilfred Palau, RN Outcome: Progressing   Problem: Education: Goal: Ability to describe self-care measures that may prevent or decrease complications (Diabetes Survival Skills Education) will improve 04/25/2024 1926 by Wilfred Palau, RN Outcome: Progressing 04/25/2024 1925 by Wilfred Palau, RN Outcome: Progressing Goal: Individualized Educational Video(s) 04/25/2024 1926 by Wilfred Palau, RN Outcome: Progressing 04/25/2024 1925 by Wilfred Palau, RN Outcome: Progressing   Problem: Coping: Goal: Ability to adjust to condition or change in health will improve 04/25/2024 1926 by Wilfred Palau, RN Outcome: Progressing 04/25/2024 1925 by Wilfred Palau, RN Outcome: Progressing   Problem: Fluid Volume: Goal: Ability to maintain a balanced intake and output will improve 04/25/2024  1926 by Wilfred Palau, RN Outcome: Progressing 04/25/2024 1925 by Wilfred Palau, RN Outcome: Progressing   Problem: Health Behavior/Discharge Planning: Goal: Ability to identify and utilize available resources and services will improve 04/25/2024 1926 by Wilfred Palau, RN Outcome: Progressing 04/25/2024 1925 by Wilfred Palau, RN Outcome: Progressing Goal: Ability to manage health-related needs will improve 04/25/2024 1926 by Wilfred Palau, RN Outcome: Progressing 04/25/2024 1925 by Wilfred Palau, RN Outcome: Progressing   Problem: Metabolic: Goal: Ability to maintain appropriate glucose  levels will improve 04/25/2024 1926 by Wilfred Palau, RN Outcome: Progressing 04/25/2024 1925 by Wilfred Palau, RN Outcome: Progressing   Problem: Nutritional: Goal: Maintenance of adequate nutrition will improve 04/25/2024 1926 by Wilfred Palau, RN Outcome: Progressing 04/25/2024 1925 by Wilfred Palau, RN Outcome: Progressing Goal: Progress toward achieving an optimal weight will improve 04/25/2024 1926 by Wilfred Palau, RN Outcome: Progressing 04/25/2024 1925 by Wilfred Palau, RN Outcome: Progressing   Problem: Skin Integrity: Goal: Risk for impaired skin integrity will decrease 04/25/2024 1926 by Wilfred Palau, RN Outcome: Progressing 04/25/2024 1925 by Wilfred Palau, RN Outcome: Progressing   Problem: Tissue Perfusion: Goal: Adequacy of tissue perfusion will improve 04/25/2024 1926 by Wilfred Palau, RN Outcome: Progressing 04/25/2024 1925 by Wilfred Palau, RN Outcome: Progressing

## 2024-04-25 NOTE — Plan of Care (Signed)

## 2024-04-26 ENCOUNTER — Encounter (HOSPITAL_COMMUNITY)
Admission: RE | Disposition: A | Discharge status: Home / Self Care | Payer: Self-pay | Source: Ambulatory Visit | Attending: Internal Medicine

## 2024-04-26 ENCOUNTER — Inpatient Hospital Stay (HOSPITAL_COMMUNITY): Payer: Self-pay | Admitting: Physician Assistant

## 2024-04-26 ENCOUNTER — Encounter (HOSPITAL_COMMUNITY): Payer: Self-pay | Admitting: Internal Medicine

## 2024-04-26 ENCOUNTER — Inpatient Hospital Stay (HOSPITAL_COMMUNITY)

## 2024-04-26 ENCOUNTER — Inpatient Hospital Stay (HOSPITAL_COMMUNITY): Admission: RE | Admit: 2024-04-26 | Source: Home / Self Care | Admitting: Podiatry

## 2024-04-26 DIAGNOSIS — I5043 Acute on chronic combined systolic (congestive) and diastolic (congestive) heart failure: Secondary | ICD-10-CM

## 2024-04-26 DIAGNOSIS — N186 End stage renal disease: Secondary | ICD-10-CM

## 2024-04-26 DIAGNOSIS — E1122 Type 2 diabetes mellitus with diabetic chronic kidney disease: Secondary | ICD-10-CM | POA: Diagnosis not present

## 2024-04-26 DIAGNOSIS — E1152 Type 2 diabetes mellitus with diabetic peripheral angiopathy with gangrene: Secondary | ICD-10-CM | POA: Diagnosis not present

## 2024-04-26 DIAGNOSIS — I739 Peripheral vascular disease, unspecified: Secondary | ICD-10-CM | POA: Diagnosis not present

## 2024-04-26 DIAGNOSIS — I132 Hypertensive heart and chronic kidney disease with heart failure and with stage 5 chronic kidney disease, or end stage renal disease: Secondary | ICD-10-CM | POA: Diagnosis not present

## 2024-04-26 DIAGNOSIS — I96 Gangrene, not elsewhere classified: Secondary | ICD-10-CM | POA: Diagnosis not present

## 2024-04-26 HISTORY — PX: TRANSMETATARSAL AMPUTATION: SHX6197

## 2024-04-26 LAB — CBC
HCT: 31.2 % — ABNORMAL LOW (ref 39.0–52.0)
Hemoglobin: 9.6 g/dL — ABNORMAL LOW (ref 13.0–17.0)
MCH: 29.5 pg (ref 26.0–34.0)
MCHC: 30.8 g/dL (ref 30.0–36.0)
MCV: 96 fL (ref 80.0–100.0)
Platelets: 149 K/uL — ABNORMAL LOW (ref 150–400)
RBC: 3.25 MIL/uL — ABNORMAL LOW (ref 4.22–5.81)
RDW: 15.9 % — ABNORMAL HIGH (ref 11.5–15.5)
WBC: 7.1 K/uL (ref 4.0–10.5)
nRBC: 0 % (ref 0.0–0.2)

## 2024-04-26 LAB — COMPREHENSIVE METABOLIC PANEL WITH GFR
ALT: 8 U/L (ref 0–44)
AST: 15 U/L (ref 15–41)
Albumin: 2.2 g/dL — ABNORMAL LOW (ref 3.5–5.0)
Alkaline Phosphatase: 98 U/L (ref 38–126)
Anion gap: 13 (ref 5–15)
BUN: 14 mg/dL (ref 8–23)
CO2: 23 mmol/L (ref 22–32)
Calcium: 7.3 mg/dL — ABNORMAL LOW (ref 8.9–10.3)
Chloride: 98 mmol/L (ref 98–111)
Creatinine, Ser: 5.23 mg/dL — ABNORMAL HIGH (ref 0.61–1.24)
GFR, Estimated: 11 mL/min — ABNORMAL LOW (ref >60)
Glucose, Bld: 120 mg/dL — ABNORMAL HIGH (ref 70–99)
Potassium: 3.6 mmol/L (ref 3.5–5.1)
Sodium: 134 mmol/L — ABNORMAL LOW (ref 135–145)
Total Bilirubin: 0.5 mg/dL (ref 0.0–1.2)
Total Protein: 5.8 g/dL — ABNORMAL LOW (ref 6.5–8.1)

## 2024-04-26 LAB — HEPATITIS B SURFACE ANTIGEN: Hepatitis B Surface Ag: NONREACTIVE

## 2024-04-26 LAB — GLUCOSE, CAPILLARY
Glucose-Capillary: 99 mg/dL (ref 70–99)
Glucose-Capillary: 88 mg/dL (ref 70–99)
Glucose-Capillary: 165 mg/dL — ABNORMAL HIGH (ref 70–99)
Glucose-Capillary: 163 mg/dL — ABNORMAL HIGH (ref 70–99)
Glucose-Capillary: 211 mg/dL — ABNORMAL HIGH (ref 70–99)

## 2024-04-26 LAB — SURGICAL PCR SCREEN
MRSA, PCR: NEGATIVE
Staphylococcus aureus: NEGATIVE

## 2024-04-26 SURGERY — AMPUTATION, FOOT, TRANSMETATARSAL
Anesthesia: Monitor Anesthesia Care | Site: Foot | Laterality: Right

## 2024-04-26 MED ORDER — LIDOCAINE-PRILOCAINE 2.5-2.5 % EX CREA
1.0000 | TOPICAL_CREAM | CUTANEOUS | Status: DC | PRN
Start: 2024-04-26 — End: 2024-04-26

## 2024-04-26 MED ORDER — VANCOMYCIN HCL 750 MG/150ML IV SOLN: 750.0000 mg | INTRAVENOUS | Status: DC

## 2024-04-26 MED ORDER — ORAL CARE MOUTH RINSE: 15.0000 mL | Freq: Once | OROMUCOSAL | Status: AC

## 2024-04-26 MED ORDER — LIDOCAINE HCL (PF) 1 % IJ SOLN
INTRAMUSCULAR | Status: AC
  Filled 2024-04-26: qty 30

## 2024-04-26 MED ORDER — SODIUM CHLORIDE 0.9 % IR SOLN
Status: DC | PRN
  Administered 2024-04-26: 1000 mL

## 2024-04-26 MED ORDER — PENTAFLUOROPROP-TETRAFLUOROETH EX AERO: 1.0000 | INHALATION_SPRAY | CUTANEOUS | Status: DC | PRN

## 2024-04-26 MED ORDER — HYDROMORPHONE HCL 1 MG/ML IJ SOLN: 0.2500 mg | INTRAMUSCULAR | Status: DC | PRN

## 2024-04-26 MED ORDER — LIDOCAINE-PRILOCAINE 2.5-2.5 % EX CREA: 1.0000 | TOPICAL_CREAM | CUTANEOUS | Status: DC | PRN

## 2024-04-26 MED ORDER — HEPARIN SODIUM (PORCINE) 1000 UNIT/ML IJ SOLN
INTRAMUSCULAR | Status: AC
  Filled 2024-04-26: qty 1

## 2024-04-26 MED ORDER — ONDANSETRON HCL 4 MG/2ML IJ SOLN: 4.0000 mg | Freq: Once | INTRAMUSCULAR | Status: DC | PRN

## 2024-04-26 MED ORDER — CHLORHEXIDINE GLUCONATE 0.12 % MT SOLN: 15.0000 mL | Freq: Once | OROMUCOSAL | Status: AC

## 2024-04-26 MED ORDER — OXYCODONE HCL 5 MG PO TABS: 5.0000 mg | ORAL_TABLET | Freq: Once | ORAL | Status: DC | PRN

## 2024-04-26 MED ORDER — ALTEPLASE 2 MG IJ SOLR: 2.0000 mg | Freq: Once | INTRAMUSCULAR | Status: DC | PRN

## 2024-04-26 MED ORDER — ACETAMINOPHEN 10 MG/ML IV SOLN: 1000.0000 mg | Freq: Once | INTRAVENOUS | Status: DC | PRN

## 2024-04-26 MED ORDER — LIDOCAINE HCL (PF) 1 % IJ SOLN: 5.0000 mL | INTRAMUSCULAR | Status: DC | PRN

## 2024-04-26 MED ORDER — FENTANYL CITRATE (PF) 50 MCG/ML IJ SOSY: 25.0000 ug | PREFILLED_SYRINGE | INTRAMUSCULAR | Status: DC | PRN

## 2024-04-26 MED ORDER — HEPARIN SODIUM (PORCINE) 1000 UNIT/ML DIALYSIS: 1000.0000 [IU] | INTRAMUSCULAR | Status: DC | PRN

## 2024-04-26 MED ORDER — FENTANYL CITRATE (PF) 100 MCG/2ML IJ SOLN: 50.0000 ug | Freq: Once | INTRAMUSCULAR | Status: AC

## 2024-04-26 MED ORDER — ONDANSETRON HCL 4 MG/2ML IJ SOLN
INTRAMUSCULAR | Status: DC | PRN
  Administered 2024-04-26: 4 mg via INTRAVENOUS

## 2024-04-26 MED ORDER — FENTANYL CITRATE (PF) 100 MCG/2ML IJ SOLN
INTRAMUSCULAR | Status: AC
  Administered 2024-04-26: 50 ug via INTRAVENOUS
  Filled 2024-04-26: qty 2

## 2024-04-26 MED ORDER — DEXAMETHASONE SOD PHOSPHATE PF 10 MG/ML IJ SOLN
INTRAMUSCULAR | Status: DC | PRN
  Administered 2024-04-26: 5 mg via INTRAVENOUS

## 2024-04-26 MED ORDER — VANCOMYCIN HCL 1500 MG/300ML IV SOLN
1500.0000 mg | Freq: Once | INTRAVENOUS | Status: DC
  Filled 2024-04-26: qty 300

## 2024-04-26 MED ORDER — NEPRO/CARBSTEADY PO LIQD: 237.0000 mL | ORAL | Status: DC | PRN

## 2024-04-26 MED ORDER — SODIUM CHLORIDE 0.9 % IV SOLN: INTRAVENOUS | Status: DC

## 2024-04-26 MED ORDER — HEPARIN SODIUM (PORCINE) 1000 UNIT/ML DIALYSIS
2500.0000 [IU] | Freq: Once | INTRAMUSCULAR | Status: AC
  Administered 2024-04-26: 2500 [IU] via INTRAVENOUS_CENTRAL
  Filled 2024-04-26: qty 3

## 2024-04-26 MED ORDER — 0.9 % SODIUM CHLORIDE (POUR BTL) OPTIME
TOPICAL | Status: DC | PRN
  Administered 2024-04-26: 1000 mL

## 2024-04-26 MED ORDER — SODIUM CHLORIDE 0.9 % IV SOLN
2.0000 g | Freq: Once | INTRAVENOUS | Status: DC
  Filled 2024-04-26: qty 12.5

## 2024-04-26 MED ORDER — BUPIVACAINE HCL (PF) 0.5 % IJ SOLN
INTRAMUSCULAR | Status: DC | PRN
  Administered 2024-04-26: 20 mL via PERINEURAL

## 2024-04-26 MED ORDER — CEFAZOLIN SODIUM-DEXTROSE 2-3 GM-%(50ML) IV SOLR
INTRAVENOUS | Status: DC | PRN
  Administered 2024-04-26: 2 g via INTRAVENOUS

## 2024-04-26 MED ORDER — OXYCODONE HCL 5 MG PO TABS: 5.0000 mg | ORAL_TABLET | ORAL | Status: DC | PRN

## 2024-04-26 MED ORDER — VANCOMYCIN HCL 1500 MG/300ML IV SOLN
1500.0000 mg | Freq: Once | INTRAVENOUS | Status: AC
  Administered 2024-04-26: 1500 mg via INTRAVENOUS
  Filled 2024-04-26: qty 300

## 2024-04-26 MED ORDER — PROPOFOL 500 MG/50ML IV EMUL
INTRAVENOUS | Status: DC | PRN
Start: 2024-04-26 — End: 2024-04-26
  Administered 2024-04-26: 50 ug/kg/min via INTRAVENOUS

## 2024-04-26 MED ORDER — BUPIVACAINE HCL (PF) 0.5 % IJ SOLN
INTRAMUSCULAR | Status: AC
  Filled 2024-04-26: qty 30

## 2024-04-26 MED ORDER — SODIUM CHLORIDE 0.9 % IV SOLN
2.0000 g | INTRAVENOUS | Status: DC
  Filled 2024-04-26: qty 12.5

## 2024-04-26 MED ORDER — CHLORHEXIDINE GLUCONATE 0.12 % MT SOLN
OROMUCOSAL | Status: AC
  Administered 2024-04-26: 15 mL via OROMUCOSAL
  Filled 2024-04-26: qty 15

## 2024-04-26 MED ORDER — CHLORHEXIDINE GLUCONATE CLOTH 2 % EX PADS: 6.0000 | MEDICATED_PAD | Freq: Every day | CUTANEOUS | Status: DC

## 2024-04-26 MED ORDER — ANTICOAGULANT SODIUM CITRATE 4% (200MG/5ML) IV SOLN: 5.0000 mL | Status: DC | PRN

## 2024-04-26 MED ORDER — OXYCODONE HCL 5 MG/5ML PO SOLN: 5.0000 mg | Freq: Once | ORAL | Status: DC | PRN

## 2024-04-26 MED ORDER — HEPARIN SODIUM (PORCINE) 1000 UNIT/ML IJ SOLN
INTRAMUSCULAR | Status: AC
  Filled 2024-04-26: qty 4

## 2024-04-26 MED ORDER — SODIUM CHLORIDE 0.9 % IV SOLN
2.0000 g | Freq: Once | INTRAVENOUS | Status: AC
  Administered 2024-04-26: 2 g via INTRAVENOUS
  Filled 2024-04-26 (×2): qty 12.5

## 2024-04-26 MED ORDER — HEPARIN SODIUM (PORCINE) 1000 UNIT/ML DIALYSIS
1000.0000 [IU] | INTRAMUSCULAR | Status: DC | PRN
  Administered 2024-04-26: 3200 [IU] via INTRAVENOUS_CENTRAL
  Administered 2024-04-26: 1000 [IU] via INTRAVENOUS_CENTRAL
  Filled 2024-04-26 (×2): qty 1

## 2024-04-26 SURGICAL SUPPLY — 21 items
BNDG ELASTIC 4INX 5YD STR LF (GAUZE/BANDAGES/DRESSINGS) IMPLANT
BNDG GAUZE DERMACEA FLUFF 4 (GAUZE/BANDAGES/DRESSINGS) IMPLANT
ELECTRODE REM PT RTRN 9FT ADLT (ELECTROSURGICAL) ×1 IMPLANT
GAUZE PAD ABD 8X10 STRL (GAUZE/BANDAGES/DRESSINGS) IMPLANT
GAUZE SPONGE 4X4 12PLY STRL (GAUZE/BANDAGES/DRESSINGS) IMPLANT
GAUZE XEROFORM 5X9 LF (GAUZE/BANDAGES/DRESSINGS) IMPLANT
GLOVE BIO SURGEON STRL SZ7.5 (GLOVE) ×1 IMPLANT
GLOVE BIOGEL PI IND STRL 7.5 (GLOVE) ×1 IMPLANT
GOWN STRL REUS W/ TWL LRG LVL3 (GOWN DISPOSABLE) ×2 IMPLANT
KIT BASIN OR (CUSTOM PROCEDURE TRAY) ×1 IMPLANT
NDL HYPO 25X1 1.5 SAFETY (NEEDLE) ×1 IMPLANT
NEEDLE HYPO 25X1 1.5 SAFETY (NEEDLE) ×1 IMPLANT
PACK ORTHO EXTREMITY (CUSTOM PROCEDURE TRAY) ×1 IMPLANT
SET HNDPC FAN SPRY TIP SCT (DISPOSABLE) IMPLANT
SOLN STERILE WATER BTL 1000 ML (IV SOLUTION) ×1 IMPLANT
SUT PROLENE 2 0 SH DA (SUTURE) IMPLANT
SUT PROLENE 3 0 PS 2 (SUTURE) IMPLANT
SYR CONTROL 10ML LL (SYRINGE) ×1 IMPLANT
TUBE CONNECTING 12X1/4 (SUCTIONS) IMPLANT
UNDERPAD 30X36 HEAVY ABSORB (UNDERPADS AND DIAPERS) ×1 IMPLANT
YANKAUER SUCT BULB TIP NO VENT (SUCTIONS) IMPLANT

## 2024-04-26 NOTE — Progress Notes (Signed)
 Pharmacy Antibiotic Note  [name] [name] is a 63 y.o. male admitted on 04/25/2024 with nonhealing gangrenous R foot, now s/p TMA on 11/14.  Pharmacy has been consulted for vancomycin  and cefepime dosing.  Patient has ESRD on MWF HD, afebrile, WBC WNL.  Plan: Vanc 1500mg  IV x 1, then 750mg  IV qHD MWF Cefepime 2gm IV qHD MWF Monitor HD schedule/tolerance, clinical progress, vanc level as needed  Height: 5' 10 (177.8 cm) Weight: 66.5 kg (146 lb 9.7 oz) IBW/kg (Calculated) : 73  Temp (24hrs), Avg:98 F (36.7 C), Min:97.6 F (36.4 C), Max:98.2 F (36.8 C)  Recent Labs  Lab 04/25/24 1652 04/25/24 1655 04/26/24 0350  WBC 7.1  --  7.1  CREATININE  --  4.46* 5.23*    Estimated Creatinine Clearance: 13.2 mL/min (A) (by C-G formula based on SCr of 5.23 mg/dL (H)).    No Known Allergies   Vanc 11/14 >> Cefepime 11/14 >>  Julio Storr D. [place], PharmD, BCPS, BCCCP 04/26/2024, 10:54 AM

## 2024-04-26 NOTE — Progress Notes (Signed)
 Patient tolerated surgery well, is going to dialysis. They will given Vanc and all other medication will be held until patient is back from dialysis. Patient is alert/orient. Denies pain, dressings on feet, clean and dry.

## 2024-04-26 NOTE — Progress Notes (Signed)
  Progress Note   Patient: [name] [name] FMW:[id number] DOB: 1960-06-23 DOA: 04/25/2024     1 DOS: the patient was seen and examined on 04/26/2024   Brief hospital course: 63 y.o. male with medical history significant of DM, HTN, HLD, ESRD on MWF HD, PAD with prior L transmetatarsal amputation who presents from [facility] with non-healing gangrenous R foot and after failing trial of abx. Pt was referred to [facility] for direct admission for R foot TMA 11/14.    When seen, pt denies sob or chest pains. No fevers or chills. Last HD 11/12 and completed entire session.  Assessment and Plan: Gangrene of R foot -direct admit by Podiatry -Completed outpt course of abx without improvement. Continued on empiric vanc and cefepime per Podiatry -Pt is now s/p  R TMA 11/14.  -continued on ASA and plavix  -f/u with PT recs   PAD -Followed by Vascular Surgery -on ASA and plavix  PTA   ESRD on HD MWF:  Last HD on 11/12, reportedly completed entire session -Nephrology consulted. To continue HD   Anemia of chronic disease:  -hemodynamically stable   Essential hypertension:  -BP currently stable -Cont home meds   Controlled IDDM-2:  -continue SSI as needed   Hypothyroidism: -Continue levothyroxine  per home regimen   Hyperlipidemia: -Continue atorvastatin  per home regimen   GERD: -Continue PPI.      Subjective: Eager to go home soon  Physical Exam: Vitals:   04/26/24 1530 04/26/24 1600 04/26/24 1621 04/26/24 1622  BP: (!) 152/81 (!) 133/95 134/77 (!) 148/72  Pulse: 69 73 72 72  Resp: 19 17 20 20   Temp:    97.7 F (36.5 C)  TempSrc:      SpO2: 92% 95% 93% 96%  Weight:    62.2 kg  Height:       General exam: Awake, laying in bed, in nad Respiratory system: Normal respiratory effort, no wheezing Cardiovascular system: regular rate, s1, s2 Gastrointestinal system: Soft, nondistended, positive BS Central nervous system: CN2-12 grossly intact, strength intact Extremities:  Perfused, s/p post op dressings B feet Skin: Normal skin turgor, no notable skin lesions seen Psychiatry: Mood normal // no visual hallucinations   Data Reviewed:  Labs reviewed: Na 134, K 3.6, WBC 7.1, Hgb 9.6, Plts 149  Family Communication: Pt in room  Disposition: Status is: Inpatient Remains inpatient appropriate because: severity of illness  Planned Discharge Destination: Home    Author: Garnette Pelt, MD 04/26/2024 4:32 PM  For on call review [email or web address].

## 2024-04-26 NOTE — Anesthesia Postprocedure Evaluation (Signed)
 Anesthesia Post Note  Patient: [name] [name]  Procedure(s) Performed: RIGHT FOOT TRANSMETATARSAL AMPUTATION (Right: Foot)     Patient location during evaluation: PACU Anesthesia Type: Regional Level of consciousness: awake and alert Pain management: pain level controlled Vital Signs Assessment: post-procedure vital signs reviewed and stable Respiratory status: spontaneous breathing, nonlabored ventilation, respiratory function stable and patient connected to nasal cannula oxygen Cardiovascular status: stable and blood pressure returned to baseline Postop Assessment: no apparent nausea or vomiting Anesthetic complications: no   No notable events documented.  Last Vitals:  Vitals:   04/26/24 1000 04/26/24 1030  BP: 135/67 133/70  Pulse: (!) 57 60  Resp: 17 16  Temp: 36.8 C 36.6 C  SpO2: 95% 97%    Last Pain:  Vitals:   04/26/24 1030  TempSrc: Oral  PainSc:                  Garnette DELENA Gab

## 2024-04-26 NOTE — Progress Notes (Signed)
 Pt. Came in on bed, assisted by porter.  Pt. Awake and oriented. Consent signed and on file. Pt. Started with no complaints  UF goal:2522ml Tx duration: 3.25 hours  Access used: Right CVC Access issue: None  Right AVF: (-) thrill and bruit  Vancomycin  given.  Pt. Completed and tolerated tx. Catheter locked and dwelled with heparin . Endorsed to the floor nurse. Pt. Left the unit with no complaints   Estanislao Morley B. Darnise Montag, RN [facility] Unit

## 2024-04-26 NOTE — Transfer of Care (Signed)
 Immediate Anesthesia Transfer of Care Note  Patient: [name] [name]  Procedure(s) Performed: RIGHT FOOT TRANSMETATARSAL AMPUTATION (Right: Foot)  Patient Location: PACU  Anesthesia Type:MAC combined with regional for post-op pain  Level of Consciousness: drowsy  Airway & Oxygen Therapy: Patient Spontanous Breathing and Patient connected to face mask oxygen  Post-op Assessment: Report given to RN and Post -op Vital signs reviewed and stable  Post vital signs: Reviewed and stable  Last Vitals:  Vitals Value Taken Time  BP    Temp    Pulse 57 04/26/24 09:23  Resp    SpO2 97 % 04/26/24 09:23  Vitals shown include unfiled device data.  Last Pain:  Vitals:   04/26/24 0825  TempSrc:   PainSc: 0-No pain      Patients Stated Pain Goal: 3 (04/26/24 0747)  Complications: No notable events documented.

## 2024-04-26 NOTE — Progress Notes (Signed)
 Pt receives out-pt HD at [facility], MWF, 1210 chair time. Will continue to assists as needed.   Lavanda Alegandra Sommers Dialysis Navigator [phone]

## 2024-04-26 NOTE — Anesthesia Procedure Notes (Signed)
 Anesthesia Regional Block: Popliteal block   Pre-Anesthetic Checklist: , timeout performed,  Correct Patient, Correct Site, Correct Laterality,  Correct Procedure, Correct Position, site marked,  Risks and benefits discussed,  Pre-op  evaluation,  At surgeon's request and post-op pain management  Laterality: Lower and Right  Prep: Maximum Sterile Barrier Precautions used, chloraprep       Needles:  Injection technique: Single-shot  Needle Type: Echogenic Needle     Needle Length: 9cm  Needle Gauge: 21     Additional Needles:   Procedures:,,,, ultrasound used (permanent image in chart),,    Narrative:  Start time: 04/26/2024 8:15 AM End time: 04/26/2024 8:20 AM Injection made incrementally with aspirations every 5 mL.  Performed by: Personally  Anesthesiologist: Jefm Garnette LABOR, MD  Additional Notes: Block assessed. Patient tolerated procedure well.

## 2024-04-26 NOTE — Op Note (Signed)
 Full Operative Report  Date of Operation: 8:25 AM, 04/26/2024   Patient: [name] [name] - 63 y.o. male  Surgeon: Malvin Marsa [facility], DPM   Assistant: None  Diagnosis: gangrene of toes 1-3 right foot  Procedure:  1.  Transmetatarsal amputation, right foot    Anesthesia: Monitor Anesthesia Care  No responsible provider has been recorded for the case.  Anesthesiologist: Jefm Garnette LABOR, MD CRNA: Elby Raelene SAUNDERS, CRNA   Estimated Blood Loss: Minimal  Hemostasis: 1) Anatomical dissection, mechanical compression, electrocautery 2) no tourniquet was used during the procedure  Implants: * No implants in log *  Materials: Prolene 2-0 and skin staples  Injectables: 1) Pre-operatively: Preop popliteal block per anesthesia 2) Post-operatively: None   Specimens: - Pathology: Right forefoot for pathology - Microbiology: None   Antibiotics: IV antibiotics given per schedule on the floor  Drains: None  Complications: Patient tolerated the procedure well without complication.   Operative findings: As below in detailed report  Indications for Procedure: [name] [name] presents to [facility], [place] with a chief complaint of worsening gangrenous changes of the 1st through 3rd digit on the right foot.  The patient has failed conservative treatments of various modalities. At this time the patient has elected to proceed with surgical correction. All alternatives, risks, and complications of the procedures were thoroughly explained to the patient. Patient exhibits appropriate understanding of all discussion points and informed consent was signed and obtained in the chart with no guarantees to surgical outcome given or implied.  Description of Procedure: Patient was brought to the operating room. Patient remained on their hospital bed in the supine position. A surgical timeout was performed and all members of the operating room, the procedure, and the surgical site  were identified. anesthesia occurred as per anesthesia record. Local anesthetic as previously described was then injected about the operative field in a local infiltrative block.  The operative lower extremity as noted above was then prepped and draped in the usual sterile manner. The following procedure then began.  Attention was directed to the RIGHT lower extremity. A fish-mouth type incision was made proximal to the web spaces and encompassed the entire forefoot. The full-thickness incision was made with a longer plantar flap to allow for wound closure. The incision was continued through the soft tissue down to the shafts of the metatarsal bones. A 15 blade was then used to free up the periosteum on all the metatarsal shafts. Using an oscillating saw, the metatarsals were cut in a dorsal distal to plantar proximal orientation. The first metatarsal was beveled so that the medial cortex was shorter than the lateral, and the fifth metatarsal was beveled so that the lateral cortex was shorter than the medial, thus less prominent. The amputation was done so that a metatarsal parabola was maintained.  The distal portion including all the digits were freed from the metatarsals and soft tissue attachments. The specimen was passed off the field and sent for gross pathology. All remaining non-viable and necrotic tissues were sharply resected and removed. Extensor and flexors tendons were grasped with a hemostat and cut proximally. Mild bleeding was seen at the amputation site   The surgical site was then flushed with 1000ml of saline under power pulse lavage. The plantar flap was brought in approximation with the dorsal flap and the sutures material previously described was used for closure. Care was taken not to place the flaps under tension in order not to jeopardize the vascular supply.  The  surgical site was then dressed with Xeroform 4 x 4 Kerlix ABD Ace. The patient tolerated both the procedure and anesthesia  well with vital signs stable throughout. The patient was transferred in good condition and all vital signs stable  from the OR to recovery under the discretion of anesthesia.  Condition: Vital signs stable, neurovascular status unchanged from preoperative   Surgical plan:  Expect clean margin, decent bleeding from the amputation site though no pulsatile bleeding. NWB on Right foot. WBAT on left foot.   The patient will be NWB in a post op shoe to the operative limb until further instructed. The dressing is to remain clean, dry, and intact. Will continue to follow unless noted elsewhere.   Marsa Honour, DPM [facility]

## 2024-04-26 NOTE — Consult Note (Signed)
 PODIATRY CONSULTATION  NAME [name] [name] MRN [id number] DOB 06/01/1961 DOA 04/25/2024   Reason for consult: gangrene right foot  Attending/Consulting physician: CANDIE Pelt MD  History of present illness: [name] [name] is a 63 y.o. male with medical history significant of DM, HTN, HLD, ESRD on MWF HD, PAD with prior L transmetatarsal amputation who presents from [facility] with non-healing gangrenous R foot and after failing trial of abx. Pt was referred to [facility] for direct admission for R foot TMA 11/14.   Pt sent from office yesterday due to worsening gangrene of right forefoot. Discussed recommendation for right foot TMA, he agrees to proceed.   Past Medical History:  Diagnosis Date   Anemia    Arthritis    Cardiomyopathy [facility])    July 2022   Diabetes mellitus [facility])    Dialysis patient    HD MWF   GERD (gastroesophageal reflux disease)    HTN (hypertension)    Hyperlipidemia    Hypothyroidism    Nephrotic syndrome    HD MWF   Obesity    Peripheral vascular disease    Pulmonary hypertension (HCC)    July 2022 in setting of nephrotic syndrome   Secondary hyperparathyroidism of renal origin        Latest Ref Rng & Units 04/26/2024    3:50 AM 04/25/2024    4:52 PM 04/16/2024   10:41 AM  CBC  WBC 4.0 - 10.5 K/uL 7.1  7.1    Hemoglobin 13.0 - 17.0 g/dL 9.6  9.4  9.9   Hematocrit 39.0 - 52.0 % 31.2  30.6  29.0   Platelets 150 - 400 K/uL 149  165         Latest Ref Rng & Units 04/26/2024    3:50 AM 04/25/2024    4:55 PM 04/16/2024   10:41 AM  BMP  Glucose 70 - 99 mg/dL 879  874  884   BUN 8 - 23 mg/dL 14  10  17    Creatinine 0.61 - 1.24 mg/dL 4.76  5.53  4.89   Sodium 135 - 145 mmol/L 134  137  138   Potassium 3.5 - 5.1 mmol/L 3.6  3.8  4.5   Chloride 98 - 111 mmol/L 98  99  98   CO2 22 - 32 mmol/L 23  26    Calcium  8.9 - 10.3 mg/dL 7.3  7.4        Physical Exam: Lower Extremity Exam  Constitutional Well developed. Well nourished.  Vascular Right  1st and 2nd toes are cold to touch.  Nonpalpable DP PT pulses bilaterally  Neurologic Normal speech. Oriented to person, place, and time. Epicritic sensation diminished to left foot  Dermatologic Amputation site  with some necrotic changes central and medial though slightly improved from prior with decreased erythema and decreased maceration       On the right foot the 1st and 2nd toes have gangrenous changes and now increasing gangrenous changes to the third toe as well.  Decreased maceration more dry stable gangrene                Orthopedic: S/p L foot TMA     ASSESSMENT/PLAN OF CARE 63 y.o. male with PMHx significant for  DM, HTN, HLD, ESRD on MWF HD, PAD  with gangrene right 1st-3rd toes in setting of significant pedal disease on right foot. S/p angiogram with intervention RLE.  WBC 7.1  - Recommend Right foot TMA given burden of gangrene and  severe pedal disease. He agrees to proceed.  - Left foot tma healing although slowly - Continue IV abx broad spectrum pending further culture data, likely transition to po abx tmrw - Anticoagulation: Continue as per primary - Wound care: none pre op - WB status: Will be NWB to Right foot post op in post op shoe. Left foot WBAT in post op shoe.  - Will continue to follow   Thank you for the consult.  Please contact me directly with any questions or concerns.           [name] [name], DPM [facility]    [address] [place], [place] [address]                Office [phone]  Fax [phone]

## 2024-04-26 NOTE — Consult Note (Signed)
 Renal Service Consult Note [facility] Lamar JONETTA Fret, MD  Patient: [name] [name] Date: 04/26/2024 Requesting Physician: Dr. Cindy  Reason for Consult: ESRD pt w/ non-healing foot wound HPI: The patient is a 63 y.o. year-old w/ PMH as below who presented from podiatry clinic with nonhealing gangrenous right foot.  He has a prior left TMA.  Last dialysis was 1112 and he completed the entire session.  Patient was admitted by [facility].  We are asked to see for dialysis   Pt seen in dialysis unit.  Has no complaints at this time, no shortness of breath or leg swelling.  He is postop.  Last HD was 11/12, has not missed HD. MWF HD.    ROS - denies CP, no joint pain, no HA, no blurry vision, no rash, no diarrhea, no nausea/ vomiting   Past Medical History  Past Medical History:  Diagnosis Date   Anemia    Arthritis    Cardiomyopathy (HCC)    July 2022   Diabetes mellitus [facility])    Dialysis patient    HD MWF   GERD (gastroesophageal reflux disease)    HTN (hypertension)    Hyperlipidemia    Hypothyroidism    Nephrotic syndrome    HD MWF   Obesity    Peripheral vascular disease    Pulmonary hypertension (HCC)    July 2022 in setting of nephrotic syndrome   Secondary hyperparathyroidism of renal origin    Past Surgical History  Past Surgical History:  Procedure Laterality Date   ABDOMINAL AORTOGRAM N/A 04/16/2024   Procedure: ABDOMINAL AORTOGRAM;  Surgeon: Serene Gaile ORN, MD;  Location: [facility] IN[facility]ASIVE [facility] LAB;  Service: Cardiovascular;  Laterality: N/A;   ABDOMINAL AORTOGRAM W/LOWER EXTREMITY N/A 01/30/2024   Procedure: ABDOMINAL AORTOGRAM W/LOWER EXTREMITY;  Surgeon: Serene Gaile ORN, MD;  Location: [facility];  Service: Cardiovascular;  Laterality: N/A;   ABDOMINAL AORTOGRAM W/LOWER EXTREMITY N/A 03/20/2024   Procedure: ABDOMINAL AORTOGRAM W/LOWER EXTREMITY;  Surgeon: Lanis Fonda BRAVO, MD;  Location: [facility] INVASIVE CV LAB;  Service: Cardiovascular;   Laterality: N/A;   AMPUTATION Left 03/25/2024   Procedure: AMPUTATION, FOOT, RAY LEFT;  Surgeon: Malvin Marsa FALCON, DPM;  Location: [facility] OR;  Service: Orthopedics/Podiatry;  Laterality: Left;  Tranmetarsal amputation   AMPUTATION TOE Left 02/14/2024   Procedure: AMPUTATION, TOE LEFT SECOND AND THIRD TOES;  Surgeon: Gershon Donnice SAUNDERS, DPM;  Location: [facility] OR;  Service: Orthopedics/Podiatry;  Laterality: Left;   AV FISTULA PLACEMENT Left 12/27/2022   Procedure: INSERTION OF LEFT ARM ARTERIOVENOUS (AV) GORE-TEX GRAFT;  Surgeon: Eliza Lonni RAMAN, MD;  Location: [facility] OR;  Service: Vascular;  Laterality: Left;   AV FISTULA PLACEMENT Right 08/17/2023   Procedure: INSERTION OF ARTERIOVENOUS (AV) GORE-TEX GRAFT RIGHT ARM USING 4-55mm GORETEX STRETCH GRAFT;  Surgeon: Magda Debby SAILOR, MD;  Location: [facility] OR;  Service: Vascular;  Laterality: Right;   AVGG REMOVAL Left 03/15/2023   Procedure: REMOVAL OF LEFT UPPER ARM ARTERIOVENOUS GORETEX GRAFT (AVGG);  Surgeon: Magda Debby SAILOR, MD;  Location: [facility] OR;  Service: Vascular;  Laterality: Left;   BIOPSY  01/13/2023   Procedure: BIOPSY;  Surgeon: Cinderella Deatrice FALCON, MD;  Location: [facility];  Service: Endoscopy;;   CATARACT EXTRACTION, BILATERAL  2018   COLONOSCOPY WITH PROPOFOL  N/A 01/13/2023   Procedure: COLONOSCOPY WITH PROPOFOL ;  Surgeon: Cinderella Deatrice FALCON, MD;  Location: [facility];  Service: Endoscopy;  Laterality: N/A;   DIALYSIS/PERMA CATHETER INSERTION Right 01/10/2024   Procedure: DIALYSIS/PERMA  CATHETER INSERTION;  Surgeon: Gretta Lonni PARAS, MD;  Location: [facility];  Service: Cardiovascular;  Laterality: Right;   INSERTION OF DIALYSIS CATHETER Right 12/27/2022   Procedure: INSERTION OF Right Internal Jugular  DIALYSIS CATHETER;  Surgeon: Eliza Lonni RAMAN, MD;  Location: [facility] OR;  Service: Vascular;  Laterality: Right;   INSERTION OF DIALYSIS CATHETER N/A 03/15/2023   Procedure: INSERTION OF TUNNELED DIALYSIS CATHETER;  Surgeon:  Magda Debby SAILOR, MD;  [facility];  Service: Vascular;  Laterality: N/A;   INSERTION OF DIALYSIS CATHETER Right 03/25/2023   Procedure: INSERTION OF RIGHT TUNNELED DIALYSIS CATHETER;  Surgeon: Pearline Norman RAMAN, MD;  Location: [facility] OR;  Service: Vascular;  Laterality: Right;   LOWER EXTREMITY ANGIOGRAPHY N/A 04/16/2024   Procedure: Lower Extremity Angiography;  Surgeon: Serene Gaile ORN, MD;  [facility];  Service: Cardiovascular;  Laterality: N/A;   LOWER EXTREMITY INTERVENTION  01/30/2024   Procedure: LOWER EXTREMITY INTERVENTION;  Surgeon: Serene Gaile ORN, MD;  [facility];  Service: Cardiovascular;;   LOWER EXTREMITY INTERVENTION N/A 04/16/2024   Procedure: LOWER EXTREMITY INTERVENTION;  Surgeon: Serene Gaile ORN, MD;  [facility];  Service: Cardiovascular;  Laterality: N/A;   PERIPHERAL INTRAVASCULAR LITHOTRIPSY  01/30/2024   Procedure: PERIPHERAL INTRAVASCULAR LITHOTRIPSY;  Surgeon: Serene Gaile ORN, MD;  [facility];  Service: Cardiovascular;;   PERIPHERAL VASCULAR ATHERECTOMY  01/30/2024   Procedure: PERIPHERAL VASCULAR ATHERECTOMY;  Surgeon: Serene Gaile ORN, MD;  [facility];  Service: Cardiovascular;;   PERIPHERAL VASCULAR THROMBECTOMY Right 12/20/2023   Procedure: PERIPHERAL VASCULAR THROMBECTOMY;  Surgeon: Pearline Norman RAMAN, MD;  Location: [facility];  Service: Cardiovascular;  Laterality: Right;   TEE WITHOUT CARDIOVERSION N/A 03/20/2023   Procedure: TRANSESOPHAGEAL ECHOCARDIOGRAM;  Surgeon: Barbaraann Darryle Debby, MD;  Location: [facility] OR;  Service: Cardiovascular;  Laterality: N/A;   TONSILLECTOMY     VENOUS ANGIOPLASTY  12/20/2023   Procedure: VENOUS ANGIOPLASTY;  Surgeon: Pearline Norman RAMAN, MD;  Location: [facility];  Service: Cardiovascular;;   VENOUS STENT  12/20/2023   Procedure: VENOUS STENT;  Surgeon: Pearline Norman RAMAN, MD;  Location: [facility];  Service: Cardiovascular;;  outflow   Family History   Family History  Problem Relation Age of Onset   Hypertension Mother    Diabetes Mother    Cancer Father        prostate cancer   Hypertension Maternal Grandfather    Rheum arthritis Maternal Grandmother    CAD Neg Hx    Social History  reports that he has never smoked. He has never used smokeless tobacco. He reports that he does not drink alcohol and does not use drugs. Allergies No Known Allergies Home medications Prior to Admission medications   Medication Sig Start Date End Date Taking? Authorizing Provider  acetaminophen  (TYLENOL ) 500 MG tablet Take 500-1,000 mg by mouth every 8 (eight) hours as needed for moderate pain (pain score 4-6).   Yes [provider]  amitriptyline  (ELAVIL ) 10 MG tablet Take 10 mg by mouth at bedtime.   Yes [provider]  amLODipine  (NORVASC ) 10 MG tablet Take 1 tablet (10 mg total) by mouth every morning. 03/29/23  Yes Regalado, Belkys A, MD  aspirin  (ASPIRIN  CHILDRENS) 81 MG chewable tablet Chew 1 tablet (81 mg total) by mouth daily. Patient taking differently: Chew 81 mg by mouth every evening. 01/30/24 01/29/25 Yes Serene Gaile ORN, MD  atorvastatin  (LIPITOR) 40 MG tablet Take  1 tablet (40 mg total) by mouth daily. 03/28/24  Yes Gonfa, Taye T, MD  calcium  acetate (PHOSLO) 667 MG capsule Take 667 mg by mouth 3 (three) times daily with meals. 07/07/23 06/27/24 Yes [provider]  clopidogrel  (PLAVIX ) 75 MG tablet Take 1 tablet (75 mg total) by mouth daily. 01/30/24  Yes Serene Gaile ORN, MD  Cyanocobalamin  (VITAMIN B12 PO) Take 1 tablet by mouth at bedtime.   Yes [provider]  gabapentin  (NEURONTIN ) 100 MG capsule Take 100 mg by mouth at bedtime. 07/05/23  Yes [provider]  levothyroxine  (SYNTHROID ) 50 MCG tablet Take 50 mcg by mouth daily before breakfast.   Yes [provider]  metoprolol  tartrate (LOPRESSOR ) 50 MG tablet Take 1 tablet (50 mg total) by mouth 2 (two) times daily. 06/16/20  Yes Comer Kirsch, PA-C  Multiple Vitamin (MULTIVITAMIN WITH MINERALS) TABS tablet Take 1 tablet by mouth daily. Men's Multivitamin for 50+   Yes [provider]  pantoprazole  (PROTONIX ) 40 MG tablet Take 1 tablet (40 mg total) by mouth daily. 30 minutes before breakfast 06/01/23  Yes Shirlean Therisa ORN, NP  Probiotic Product (PROBIOTIC DAILY PO) Take 1 capsule by mouth at bedtime.   Yes [provider]  Senna (SENOKOT LAXATIVE GUMMIES) 8.7 MG CHEW Chew 1-4 each by mouth daily as needed (constipation.).   Yes [provider]  Accu-Chek Softclix Lancets lancets  08/06/20   [provider]     Vitals:   04/25/24 1650 04/25/24 1658 04/25/24 2000 04/25/24 2149  BP:   (!) 141/102 (!) 141/102  Pulse:   64 64  Resp:   18   Temp:   97.9 F (36.6 C)   TempSrc:   Oral   SpO2:   96%   Weight:  66.5 kg    Height: 5' 10 (1.778 m)      Exam Gen alert, no distress Sclera anicteric, throat clear  No jvd or bruits Chest clear bilat to bases RRR no MRG Abd soft ntnd no mass or ascites +bs Ext no LE or UE edema, no other edema Bilat TMA, both wrapped now Neuro is alert, Ox 3 , nf    TDC RIJ intact    OP HD: MWF NW  4h  B400   69kg   TDC Heparin  3000 bolus + 500 midrun  Labs-> K+3.6 creat 5.3, BUN 14    Hb 9.6   Assessment/ Plan: Gangrene R foot: went for R TMA surgery this afternoon. Had dialysis after the surgery.  ESRD: on HD MWF. Had as above today. Next HD Monday.  HTN: bp's on the high side, can resume home meds per pmd.  Volume: euvolemic on exam, lungs clear and no swelling.  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    Myer Fret  MD [facility] 04/26/2024, 7:40 AM  Recent Labs  Lab 04/25/24 1652 04/25/24 1655 04/26/24 0350  HGB 9.4*  --  9.6*  ALBUMIN   --  2.2* 2.2*  CALCIUM   --  7.4* 7.3*  CREATININE  --  4.46* 5.23*  K  --  3.8 3.6   Inpatient medications:  [MAR Hold] amitriptyline   10 mg Oral QHS   [MAR Hold] amLODipine   10 mg Oral q morning   [MAR  Hold] aspirin   81 mg Oral QPM   [MAR Hold] atorvastatin   40 mg Oral Daily   [MAR Hold] calcium  acetate  667 mg Oral TID WC   chlorhexidine   15 mL Mouth/Throat Once  Or   mouth rinse  15 mL Mouth Rinse Once   chlorhexidine        [MAR Hold] Chlorhexidine  Gluconate Cloth  6 each Topical Daily   [MAR Hold] clopidogrel   75 mg Oral Daily   [MAR Hold] feeding supplement (NEPRO CARB STEADY)  237 mL Oral BID BM   [MAR Hold] gabapentin   100 mg Oral QHS   [MAR Hold] heparin   5,000 Units Subcutaneous Q8H   [MAR Hold] insulin  aspart  0-5 Units Subcutaneous QHS   [MAR Hold] insulin  aspart  0-6 Units Subcutaneous TID WC   [MAR Hold] levothyroxine   50 mcg Oral QAC breakfast   [MAR Hold] metoprolol  tartrate  50 mg Oral BID   [MAR Hold] mupirocin  ointment  1 Application Nasal BID   [MAR Hold] pantoprazole   40 mg Oral Daily   [MAR Hold] sodium chloride  flush  10-40 mL Intracatheter Q12H    sodium chloride      acetaminophen      acetaminophen , [MAR Hold] acetaminophen  **OR** [MAR Hold] acetaminophen , chlorhexidine , [MAR Hold] guaiFENesin -dextromethorphan , HYDROmorphone  (DILAUDID ) injection, ondansetron  (ZOFRAN ) IV, oxyCODONE  **OR** oxyCODONE , [MAR Hold] sodium chloride  flush

## 2024-04-26 NOTE — Hospital Course (Signed)
 63 y.o. male with medical history significant of DM, HTN, HLD, ESRD on MWF HD, PAD with prior L transmetatarsal amputation who presents from [facility] with non-healing gangrenous R foot and after failing trial of abx. Pt was referred to [facility] for direct admission for R foot TMA 11/14.    When seen, pt denies sob or chest pains. No fevers or chills. Last HD 11/12 and completed entire session.

## 2024-04-26 NOTE — Progress Notes (Signed)
   04/26/24 1622  Vitals  Temp 97.7 F (36.5 C)  Pulse Rate 72  Resp 20  BP (!) 148/72  SpO2 96 %  O2 Device Room Air  Weight 62.2 kg  Type of Weight Post-Dialysis  Oxygen Therapy  Patient Activity (if Appropriate) In bed  Pulse Oximetry Type Continuous  Oximetry Probe Site Changed No  Post Treatment  Dialyzer Clearance Lightly streaked  Hemodialysis Intake (mL) 0 mL  Liters Processed 78  Fluid Removed (mL) 2500 mL  Tolerated HD Treatment Yes  Post-Hemodialysis Comments pt. tolerated tx well

## 2024-04-26 NOTE — Plan of Care (Signed)
  Problem: Education: Goal: Knowledge of General Education information will improve Description: Including pain rating scale, medication(s)/side effects and non-pharmacologic comfort measures Outcome: Progressing   Problem: Health Behavior/Discharge Planning: Goal: Ability to manage health-related needs will improve Outcome: Progressing   Problem: Clinical Measurements: Goal: Ability to maintain clinical measurements within normal limits will improve Outcome: Progressing Goal: Will remain free from infection Outcome: Progressing Goal: Diagnostic test results will improve Outcome: Progressing Goal: Respiratory complications will improve Outcome: Progressing Goal: Cardiovascular complication will be avoided Outcome: Progressing   Problem: Activity: Goal: Risk for activity intolerance will decrease Outcome: Progressing   Problem: Nutrition: Goal: Adequate nutrition will be maintained Outcome: Progressing   Problem: Coping: Goal: Level of anxiety will decrease Outcome: Progressing   Problem: Elimination: Goal: Will not experience complications related to bowel motility Outcome: Progressing Goal: Will not experience complications related to urinary retention Outcome: Progressing   Problem: Pain Managment: Goal: General experience of comfort will improve and/or be controlled Outcome: Progressing   Problem: Safety: Goal: Ability to remain free from injury will improve Outcome: Progressing   Problem: Skin Integrity: Goal: Risk for impaired skin integrity will decrease Outcome: Progressing   Problem: Education: Goal: Ability to describe self-care measures that may prevent or decrease complications (Diabetes Survival Skills Education) will improve Outcome: Progressing Goal: Individualized Educational Video(s) Outcome: Progressing   Problem: Coping: Goal: Ability to adjust to condition or change in health will improve Outcome: Progressing   Problem: Fluid  Volume: Goal: Ability to maintain a balanced intake and output will improve Outcome: Progressing   Problem: Health Behavior/Discharge Planning: Goal: Ability to identify and utilize available resources and services will improve Outcome: Progressing Goal: Ability to manage health-related needs will improve Outcome: Progressing   Problem: Metabolic: Goal: Ability to maintain appropriate glucose levels will improve Outcome: Progressing   Problem: Nutritional: Goal: Maintenance of adequate nutrition will improve Outcome: Progressing Goal: Progress toward achieving an optimal weight will improve Outcome: Progressing   Problem: Skin Integrity: Goal: Risk for impaired skin integrity will decrease Outcome: Progressing   Problem: Tissue Perfusion: Goal: Adequacy of tissue perfusion will improve Outcome: Progressing   Problem: Clinical Measurements: Goal: Ability to avoid or minimize complications of infection will improve Outcome: Progressing   Problem: Skin Integrity: Goal: Skin integrity will improve Outcome: Progressing

## 2024-04-26 NOTE — Progress Notes (Signed)
 Lab results on nasal swab negative for MRSA and Staph A.

## 2024-04-27 ENCOUNTER — Other Ambulatory Visit (HOSPITAL_COMMUNITY): Payer: Self-pay

## 2024-04-27 ENCOUNTER — Encounter (HOSPITAL_COMMUNITY): Payer: Self-pay | Admitting: Podiatry

## 2024-04-27 LAB — GLUCOSE, CAPILLARY
Glucose-Capillary: 81 mg/dL (ref 70–99)
Glucose-Capillary: 191 mg/dL — ABNORMAL HIGH (ref 70–99)

## 2024-04-27 MED ORDER — OXYCODONE HCL 5 MG PO TABS
5.0000 mg | ORAL_TABLET | ORAL | 0 refills | Status: DC | PRN
  Filled 2024-04-27: qty 15, 3d supply, fill #0

## 2024-04-27 MED ORDER — DOXYCYCLINE HYCLATE 100 MG PO TABS
100.0000 mg | ORAL_TABLET | Freq: Two times a day (BID) | ORAL | 0 refills | Status: AC
  Filled 2024-04-27: qty 20, 10d supply, fill #0

## 2024-04-27 NOTE — Progress Notes (Signed)
 [facility] Progress Note   Subjective:   Denies any pain in feet. Denies SOB, CP, dizziness.   Objective Vitals:   04/26/24 2313 04/27/24 0423 04/27/24 0753 04/27/24 0947  BP: (!) 156/89 133/66 (!) 120/56 (!) 151/69  Pulse: 69 (!) 59 (!) 59 62  Resp:  20 19   Temp:  97.9 F (36.6 C) 97.7 F (36.5 C)   TempSrc:  Oral Oral   SpO2:  96% (!) 89%   Weight:      Height:       Physical Exam General: alert male in NAD Heart: RRR, no murmurs, rubs or gallops Lungs: CTA bilaterally, respirations unlabored Abdomen: Soft, non-distended, +BS Extremities: No edema b/l lower extremities Dialysis Access:  R internal jugular TDC  Additional Objective Labs: Basic Metabolic Panel: Recent Labs  Lab 04/25/24 1655 04/26/24 0350  NA 137 134*  K 3.8 3.6  CL 99 98  CO2 26 23  GLUCOSE 125* 120*  BUN 10 14  CREATININE 4.46* 5.23*  CALCIUM  7.4* 7.3*   Liver Function Tests: Recent Labs  Lab 04/25/24 1655 04/26/24 0350  AST 19 15  ALT 10 8  ALKPHOS 111 98  BILITOT 0.7 0.5  PROT 6.2* 5.8*  ALBUMIN  2.2* 2.2*   No results for input(s): LIPASE, AMYLASE in the last 168 hours. CBC: Recent Labs  Lab 04/25/24 1652 04/26/24 0350  WBC 7.1 7.1  HGB 9.4* 9.6*  HCT 30.6* 31.2*  MCV 95.3 96.0  PLT 165 149*   Blood Culture    Component Value Date/Time   SDES TISSUE 03/25/2024 1350   SPECREQUEST LEFT FOREFOOT 03/25/2024 1350   CULT  03/25/2024 1350    FEW METHICILLIN RESISTANT STAPHYLOCOCCUS AUREUS RARE CANDIDA PARAPSILOSIS NO ANAEROBES ISOLATED Performed at [facility], [address], [place], [place] [address]    REPTSTATUS 03/30/2024 FINAL 03/25/2024 1350    Cardiac Enzymes: No results for input(s): CKTOTAL, CKMB, CKMBINDEX, TROPONINI in the last 168 hours. CBG: Recent Labs  Lab 04/26/24 1154 04/26/24 1732 04/26/24 1948 04/27/24 0757 04/27/24 1118  GLUCAP 165* 163* 211* 81 191*   Iron Studies: No results for input(s): IRON,  TIBC, TRANSFERRIN, FERRITIN in the last 72 hours. @lablastinr3 @ Studies/Results: DG Foot 2 Views Right Result Date: 04/26/2024 CLINICAL DATA:  Postop. EXAM: RIGHT FOOT - 2 VIEW COMPARISON:  No recent right foot exams. FINDINGS: Transmetatarsal amputation of all 5 rays. The resection margins are smooth. Skin staples overlie the operative bed. Peripheral vascular calcifications are seen. No acute fracture. IMPRESSION: Transmetatarsal amputation of all 5 rays. Electronically Signed   By: Andrea Gasman M.D.   On: 04/26/2024 11:49   Medications:  [START ON 04/29/2024] ceFEPime (MAXIPIME) IV     [START ON 04/29/2024] vancomycin       amitriptyline   10 mg Oral QHS   amLODipine   10 mg Oral q morning   aspirin   81 mg Oral QPM   atorvastatin   40 mg Oral Daily   calcium  acetate  667 mg Oral TID WC   Chlorhexidine  Gluconate Cloth  6 each Topical Daily   Chlorhexidine  Gluconate Cloth  6 each Topical Q0600   clopidogrel   75 mg Oral Daily   feeding supplement (NEPRO CARB STEADY)  237 mL Oral BID BM   gabapentin   100 mg Oral QHS   heparin   5,000 Units Subcutaneous Q8H   insulin  aspart  0-5 Units Subcutaneous QHS   insulin  aspart  0-6 Units Subcutaneous TID WC   levothyroxine   50 mcg Oral  QAC breakfast   metoprolol  tartrate  50 mg Oral BID   mupirocin  ointment  1 Application Nasal BID   pantoprazole   40 mg Oral Daily   sodium chloride  flush  10-40 mL Intracatheter Q12H    Dialysis Orders: MWF NW  4h  B400   69kg   TDC Heparin  3000 bolus + 500 midrun   Labs-> K+3.6 creat 5.3, BUN 14    Hb 9.6  Assessment/Plan: Gangrene R foot: s/p R TMA surgery this. Had dialysis after the surgery.  ESRD: on HD MWF. Next HD Monday.  HTN: bp's improving. Continue home meds Volume: euvolemic on exam, lungs clear and no swelling.  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     Lucie Collet, PA-C 04/27/2024, 11:50 AM  [facility] Pager: [phone]

## 2024-04-27 NOTE — Discharge Instructions (Addendum)
 Dressing change instructions: Starting on Monday, 04/29/2024 remove the dressings and place new fresh dressings with Betadine or Povidine-iodine  ointment or liquid solution along the sutures, dry gauze, gauze roll and the Ace wrap reapplied under minimal tension or use tape to secure gauze.  Do not put any weight on the right foot.  You may put weight on the left foot in the supplied shoe.  Anytime you are up and moving shoe on the left foot and shoe on the right foot needs to be on.  Our office will contact you for follow-up with Dr. Malvin next week

## 2024-04-27 NOTE — Discharge Summary (Signed)
 Physician Discharge Summary   Patient: [name] [name] MRN: [id number] DOB: 02-10-1961  Admit date:     04/25/2024  Discharge date: 04/27/24  Discharge Physician: Garnette Pelt   PCP: Kip Righter, MD   Recommendations at discharge:    Follow up with PCP in 1-2 Follow up with Podiatry as scheduled  Discharge Diagnoses: Principal Problem:   PAD (peripheral artery disease) Active Problems:   Gangrene of right foot (HCC)  Resolved Problems:   * No resolved hospital problems. *  Hospital Course: 63 y.o. male with medical history significant of DM, HTN, HLD, ESRD on MWF HD, PAD with prior L transmetatarsal amputation who presents from [facility] with non-healing gangrenous R foot and after failing trial of abx. Pt was referred to [facility] for direct admission for R foot TMA 11/14.    When seen, pt denies sob or chest pains. No fevers or chills. Last HD 11/12 and completed entire session.  Assessment and Plan: Gangrene of R foot -direct admit by Podiatry -Completed outpt course of abx without improvement. Was continued on empiric vanc and cefepime per Podiatry -Pt is now s/p  R TMA 11/14.  -continued on ASA and plavix  -No PT recs noted -Discussed with Podiatry, recommendation for 10 days of doxy on d/c   PAD -Followed by Vascular Surgery -on ASA and plavix  PTA   ESRD on HD MWF:  Last HD on 11/12, reportedly completed entire session -Nephrology consulted. To continue HD as scheduled   Anemia of chronic disease:  -hemodynamically stable   Essential hypertension:  -BP currently stable -Cont home meds   Controlled IDDM-2:  -continue SSI as needed while in hospital   Hypothyroidism: -Continue levothyroxine  per home regimen   Hyperlipidemia: -Continue atorvastatin  per home regimen   GERD: -Continued PPI.       Consultants: Podiatry, Nephrology Procedures performed: R TMA  Disposition: Home Diet recommendation:  Renal diet DISCHARGE MEDICATION: Allergies as  of 04/27/2024   No Known Allergies      Medication List     TAKE these medications    Accu-Chek Softclix Lancets lancets   acetaminophen  500 MG tablet Commonly known as: TYLENOL  Take 500-1,000 mg by mouth every 8 (eight) hours as needed for moderate pain (pain score 4-6).   amitriptyline  10 MG tablet Commonly known as: ELAVIL  Take 10 mg by mouth at bedtime.   amLODipine  10 MG tablet Commonly known as: NORVASC  Take 1 tablet (10 mg total) by mouth every morning.   aspirin  81 MG chewable tablet Commonly known as: Aspirin  Childrens Chew 1 tablet (81 mg total) by mouth daily. What changed: when to take this   atorvastatin  40 MG tablet Commonly known as: LIPITOR Take 1 tablet (40 mg total) by mouth daily.   calcium  acetate 667 MG capsule Commonly known as: PHOSLO Take 667 mg by mouth 3 (three) times daily with meals.   clopidogrel  75 MG tablet Commonly known as: Plavix  Take 1 tablet (75 mg total) by mouth daily.   doxycycline  100 MG tablet Commonly known as: VIBRA -TABS Take 1 tablet (100 mg total) by mouth 2 (two) times daily for 10 days.   gabapentin  100 MG capsule Commonly known as: NEURONTIN  Take 100 mg by mouth at bedtime.   levothyroxine  50 MCG tablet Commonly known as: SYNTHROID  Take 50 mcg by mouth daily before breakfast.   metoprolol  tartrate 50 MG tablet Commonly known as: LOPRESSOR  Take 1 tablet (50 mg total) by mouth 2 (two) times daily.   multivitamin with minerals Tabs  tablet Take 1 tablet by mouth daily. Men's Multivitamin for 50+   oxyCODONE  5 MG immediate release tablet Commonly known as: Oxy IR/ROXICODONE  Take 1 tablet (5 mg total) by mouth every 4 (four) hours as needed for moderate pain (pain score 4-6).   pantoprazole  40 MG tablet Commonly known as: PROTONIX  Take 1 tablet (40 mg total) by mouth daily. 30 minutes before breakfast   PROBIOTIC DAILY PO Take 1 capsule by mouth at bedtime.   Senokot Laxative Gummies 8.7 MG Chew Generic  drug: Senna Chew 1-4 each by mouth daily as needed (constipation.).   VITAMIN B12 PO Take 1 tablet by mouth at bedtime.        Follow-up Information     Malvin Marsa FALCON, DPM. Call on 04/29/2024.   Specialty: Podiatry Contact information: [address] [place] [address] [phone]         Kip Righter, MD Follow up in 2 week(s).   Specialty: Family Medicine Why: Hospital follow up Contact information: [address] [place] [address] [phone]                Discharge Exam: [name] [name]   04/25/24 1658 04/26/24 1622  Weight: 66.5 kg 62.2 kg   General exam: Awake, laying in bed, in nad Respiratory system: Normal respiratory effort, no wheezing Cardiovascular system: regular rate, s1, s2 Gastrointestinal system: Soft, nondistended, positive BS Central nervous system: CN2-12 grossly intact, strength intact Extremities: Perfused, no clubbing Skin: Normal skin turgor, no notable skin lesions seen Psychiatry: Mood normal // no visual hallucinations   Condition at discharge: fair  The results of significant diagnostics from this hospitalization (including imaging, microbiology, ancillary and laboratory) are listed below for reference.   Imaging Studies: DG Foot 2 Views Right Result Date: 04/26/2024 CLINICAL DATA:  Postop. EXAM: RIGHT FOOT - 2 VIEW COMPARISON:  No recent right foot exams. FINDINGS: Transmetatarsal amputation of all 5 rays. The resection margins are smooth. Skin staples overlie the operative bed. Peripheral vascular calcifications are seen. No acute fracture. IMPRESSION: Transmetatarsal amputation of all 5 rays. Electronically Signed   By: Andrea Gasman M.D.   On: 04/26/2024 11:49   PERIPHERAL VASCULAR CATHETERIZATION Result Date: 04/16/2024 Images from the original result were not included. Patient name: [name] [name] MRN: [id number] DOB: 1960-12-19 Sex: male 04/16/2024 Pre-operative  Diagnosis: Right foot ulcer Post-operative diagnosis:  Same Surgeon:  Malvina New Procedure Performed:  1.  Ultrasound-guided access, left femoral artery  2.  Aortobifemoral angiogram  3.  Right leg angiogram  4.  Selective injection with catheter in the right external iliac, right popliteal, right posterior tibial artery  5.  Balloon angioplasty right posterior tibial artery  6.  Conscious sedation, 65 minutes  7.  Closure device, Celt Indications: This is a 63 year old gentleman with nonhealing wound on his right leg who comes in today for angiography Procedure:  The patient was identified in the holding area and taken to room 8.  The patient was then placed supine on the table and prepped and draped in the usual sterile fashion.  A time out was called.  Conscious sedation was administered with the use of IV fentanyl  and Versed  under continuous physician and nurse monitoring.  Heart rate, blood pressure, and oxygen saturation were continuously monitored.  Total sedation time was 65 minutes.  Ultrasound was used to evaluate the left common femoral artery.  It was patent .  A digital ultrasound image was acquired.  A micropuncture  needle was used to access the left common femoral artery under ultrasound guidance.  An 018 wire was advanced without resistance and a micropuncture sheath was placed.  The 018 wire was removed and a benson wire was placed.  The micropuncture sheath was exchanged for a 5 french sheath.  An omniflush catheter was advanced over the wire to the level of L-1.  An abdominal angiogram was obtained.  Next, using the omniflush catheter and a benson wire, the aortic bifurcation was crossed and the catheter was placed into theright external iliac artery and right runoff was obtained. Findings:  Aortogram: No significant ostial renal artery stenosis visualized.  The infrarenal abdominal aorta was widely patent.  Bilateral common and external iliac arteries widely patent.  Bilateral common femorals  widely patent.  Right Lower Extremity: The right common femoral, profundofemoral, and superficial femoral artery are widely patent.  The popliteal artery is widely patent.  There is very sluggish migration of contrast throughout the leg.  There is single-vessel runoff via the posterior tibial artery which occludes at the ankle.  There were 2 areas of heavily calcified greater than 90% stenosis.  Diffuse disease is visualized out of the foot Intervention: After the above images are acquired the decision was made to proceed with intervention.  A 5 French 90 cm sheath was advanced into the right popliteal artery.  Contrast injections were then performed at this level to better define the anatomy.  I then used a V-18 wire with the support of a 2 x 100 Sterling balloon to select the posterior tibial artery.  Selective injections with the balloon in the posterior tibial artery were performed.  There was a occlusion at the level of the ankle that I was unable to cross.  There were several greater than 90% heavily calcified stenosis that were treated with a 2 mm balloon.  Completion imaging shows inline flow through the posterior tibial artery down to the ankle where it occludes.  There is minimal perfusion out onto the foot.  Catheters and wires were removed.  The sheath was removed and closed with a Celt Impression:  #1  No significant aortoiliac occlusive disease  #2  No significant right femoral-popliteal stenosis  #3  Severe tibial disease.  The posterior tibial artery is patent down to the ankle where it occludes.  There are several lesions that were treated with a 2 mm balloon.  I was unable to cross into the plantar arteries.  There is diffuse disease out onto the foot.  At this point the patient is maximally revascularized.  ALONSO Malvina New, M.D., FACS [facility]: [phone] Pager:  [phone]    Microbiology: Results for orders placed or performed during the  hospital encounter of 04/25/24  Surgical PCR screen     Status: None   Collection Time: 04/25/24  9:33 PM   Specimen: Nasal Mucosa; Nasal Swab  Result Value Ref Range Status   MRSA, PCR NEGATIVE NEGATIVE Final   Staphylococcus aureus NEGATIVE NEGATIVE Final    Comment: (NOTE) The Xpert SA Assay (FDA approved for NASAL specimens in patients 49 years of age and older), is one component of a comprehensive surveillance program. It is not intended to diagnose infection nor to guide or monitor treatment. Performed at [facility], [address] [place], [place] [address]     Labs: CBC: Recent Labs  Lab 04/25/24 1652 04/26/24 0350  WBC 7.1 7.1  HGB 9.4* 9.6*  HCT 30.6* 31.2*  MCV 95.3 96.0  PLT 165 149*   Basic Metabolic Panel: Recent Labs  Lab 04/25/24 1655 04/26/24 0350  NA 137 134*  K 3.8 3.6  CL 99 98  CO2 26 23  GLUCOSE 125* 120*  BUN 10 14  CREATININE 4.46* 5.23*  CALCIUM  7.4* 7.3*   Liver Function Tests: Recent Labs  Lab 04/25/24 1655 04/26/24 0350  AST 19 15  ALT 10 8  ALKPHOS 111 98  BILITOT 0.7 0.5  PROT 6.2* 5.8*  ALBUMIN  2.2* 2.2*   CBG: Recent Labs  Lab 04/26/24 1154 04/26/24 1732 04/26/24 1948 04/27/24 0757 04/27/24 1118  GLUCAP 165* 163* 211* 81 191*    Discharge time spent: less than 30 minutes.  Signed: Garnette Pelt, MD [facility] 04/27/2024

## 2024-04-27 NOTE — Plan of Care (Signed)
 Problem: Education: Goal: Knowledge of General Education information will improve Description: Including pain rating scale, medication(s)/side effects and non-pharmacologic comfort measures 04/27/2024 0727 by Burnard Almarie BROCKS, RN Outcome: Progressing 04/26/2024 1953 by Burnard Almarie BROCKS, RN Outcome: Progressing   Problem: Health Behavior/Discharge Planning: Goal: Ability to manage health-related needs will improve 04/27/2024 0727 by Burnard Almarie BROCKS, RN Outcome: Progressing 04/26/2024 1953 by Burnard Almarie BROCKS, RN Outcome: Progressing   Problem: Clinical Measurements: Goal: Ability to maintain clinical measurements within normal limits will improve 04/27/2024 0727 by Burnard Almarie BROCKS, RN Outcome: Progressing 04/26/2024 1953 by Burnard Almarie BROCKS, RN Outcome: Progressing Goal: Will remain free from infection 04/27/2024 0727 by Burnard Almarie BROCKS, RN Outcome: Progressing 04/26/2024 1953 by Burnard Almarie BROCKS, RN Outcome: Progressing Goal: Diagnostic test results will improve 04/27/2024 0727 by Burnard Almarie BROCKS, RN Outcome: Progressing 04/26/2024 1953 by Burnard Almarie BROCKS, RN Outcome: Progressing Goal: Respiratory complications will improve 04/27/2024 0727 by Burnard Almarie BROCKS, RN Outcome: Progressing 04/26/2024 1953 by Burnard Almarie BROCKS, RN Outcome: Progressing Goal: Cardiovascular complication will be avoided 04/27/2024 0727 by Burnard Almarie BROCKS, RN Outcome: Progressing 04/26/2024 1953 by Burnard Almarie BROCKS, RN Outcome: Progressing   Problem: Activity: Goal: Risk for activity intolerance will decrease 04/27/2024 0727 by Burnard Almarie BROCKS, RN Outcome: Progressing 04/26/2024 1953 by Burnard Almarie BROCKS, RN Outcome: Progressing   Problem: Nutrition: Goal: Adequate nutrition will be maintained 04/27/2024 0727 by Burnard Almarie BROCKS, RN Outcome: Progressing 04/26/2024 1953 by Burnard Almarie BROCKS, RN Outcome: Progressing   Problem: Coping: Goal: Level  of anxiety will decrease 04/27/2024 0727 by Burnard Almarie BROCKS, RN Outcome: Progressing 04/26/2024 1953 by Burnard Almarie BROCKS, RN Outcome: Progressing   Problem: Elimination: Goal: Will not experience complications related to bowel motility 04/27/2024 0727 by Burnard Almarie BROCKS, RN Outcome: Progressing 04/26/2024 1953 by Burnard Almarie BROCKS, RN Outcome: Progressing Goal: Will not experience complications related to urinary retention 04/27/2024 0727 by Burnard Almarie BROCKS, RN Outcome: Progressing 04/26/2024 1953 by Burnard Almarie BROCKS, RN Outcome: Progressing   Problem: Pain Managment: Goal: General experience of comfort will improve and/or be controlled 04/27/2024 0727 by Burnard Almarie BROCKS, RN Outcome: Progressing 04/26/2024 1953 by Burnard Almarie BROCKS, RN Outcome: Progressing   Problem: Safety: Goal: Ability to remain free from injury will improve 04/27/2024 0727 by Burnard Almarie BROCKS, RN Outcome: Progressing 04/26/2024 1953 by Burnard Almarie BROCKS, RN Outcome: Progressing   Problem: Skin Integrity: Goal: Risk for impaired skin integrity will decrease 04/27/2024 0727 by Burnard Almarie BROCKS, RN Outcome: Progressing 04/26/2024 1953 by Burnard Almarie BROCKS, RN Outcome: Progressing   Problem: Education: Goal: Ability to describe self-care measures that may prevent or decrease complications (Diabetes Survival Skills Education) will improve 04/27/2024 0727 by Burnard Almarie BROCKS, RN Outcome: Progressing 04/26/2024 1953 by Burnard Almarie BROCKS, RN Outcome: Progressing Goal: Individualized Educational Video(s) 04/27/2024 0727 by Burnard Almarie BROCKS, RN Outcome: Progressing 04/26/2024 1953 by Burnard Almarie BROCKS, RN Outcome: Progressing   Problem: Coping: Goal: Ability to adjust to condition or change in health will improve 04/27/2024 0727 by Burnard Almarie BROCKS, RN Outcome: Progressing 04/26/2024 1953 by Burnard Almarie BROCKS, RN Outcome: Progressing   Problem: Fluid Volume: Goal:  Ability to maintain a balanced intake and output will improve 04/27/2024 0727 by Burnard Almarie BROCKS, RN Outcome: Progressing 04/26/2024 1953 by Burnard Almarie BROCKS, RN Outcome: Progressing   Problem: Health Behavior/Discharge Planning: Goal: Ability to identify and utilize available resources and services will improve 04/27/2024 0727 by Burnard Almarie BROCKS, RN Outcome: Progressing 04/26/2024 1953 by  Burnard Almarie BROCKS, RN Outcome: Progressing Goal: Ability to manage health-related needs will improve 04/27/2024 0727 by Burnard Almarie BROCKS, RN Outcome: Progressing 04/26/2024 1953 by Burnard Almarie BROCKS, RN Outcome: Progressing   Problem: Metabolic: Goal: Ability to maintain appropriate glucose levels will improve 04/27/2024 0727 by Burnard Almarie BROCKS, RN Outcome: Progressing 04/26/2024 1953 by Burnard Almarie BROCKS, RN Outcome: Progressing   Problem: Nutritional: Goal: Maintenance of adequate nutrition will improve 04/27/2024 0727 by Burnard Almarie BROCKS, RN Outcome: Progressing 04/26/2024 1953 by Burnard Almarie BROCKS, RN Outcome: Progressing Goal: Progress toward achieving an optimal weight will improve 04/27/2024 0727 by Burnard Almarie BROCKS, RN Outcome: Progressing 04/26/2024 1953 by Burnard Almarie BROCKS, RN Outcome: Progressing   Problem: Skin Integrity: Goal: Risk for impaired skin integrity will decrease 04/27/2024 0727 by Burnard Almarie BROCKS, RN Outcome: Progressing 04/26/2024 1953 by Burnard Almarie BROCKS, RN Outcome: Progressing   Problem: Tissue Perfusion: Goal: Adequacy of tissue perfusion will improve 04/27/2024 0727 by Burnard Almarie BROCKS, RN Outcome: Progressing 04/26/2024 1953 by Burnard Almarie BROCKS, RN Outcome: Progressing   Problem: Clinical Measurements: Goal: Ability to avoid or minimize complications of infection will improve 04/27/2024 0727 by Burnard Almarie BROCKS, RN Outcome: Progressing 04/26/2024 1953 by Burnard Almarie BROCKS, RN Outcome: Progressing   Problem:  Skin Integrity: Goal: Skin integrity will improve 04/27/2024 0727 by Burnard Almarie BROCKS, RN Outcome: Progressing 04/26/2024 1953 by Burnard Almarie BROCKS, RN Outcome: Progressing

## 2024-04-27 NOTE — Evaluation (Addendum)
 Physical Therapy Evaluation and Discharge Patient Details Name: [name] [name] MRN: [id number] DOB: June 16, 1960 Today's Date: 04/27/2024  History of Present Illness  Pt is a 63 y.o. male s/p R TMA 11/14 due to gangrene. He underwent L TMA Oct 2025. PMH:  HTN, HLD, DM, ESRD on HD MWF, PAD  Clinical Impression  PT eval complete. PTA pt lived with his mother, who provided mobility and ADL assist as needed. Pt w/c dependent at baseline, having manual and electric w/c. On eval, pt demo mod I bed mobility and min assist transfers. Pt noncompliant with NWB RLE despite education and cueing. Pt is adamant about returning home. Per pt and RN, plan is for d/c today. No DME needs. No PT follow up services indicated initially, to minimize mobility/WBing and maximize surgical healing. Once WB status increased, pt would benefit from HHPT for strengthening and mobility progression. PT signing off.        If plan is discharge home, recommend the following: A little help with walking and/or transfers;A little help with bathing/dressing/bathroom;Assistance with cooking/housework;Assist for transportation;Help with stairs or ramp for entrance   Can travel by private vehicle        Equipment Recommendations None recommended by PT  Recommendations for Other Services       Functional Status Assessment Patient has had a recent decline in their functional status and demonstrates the ability to make significant improvements in function in a reasonable and predictable amount of time.     Precautions / Restrictions Precautions Precautions: Fall Recall of Precautions/Restrictions: Impaired Required Braces or Orthoses: Other Brace Other Brace: bilat post op shoes Restrictions Weight Bearing Restrictions Per Provider Order: Yes RLE Weight Bearing Per Provider Order: Non weight bearing LLE Weight Bearing Per Provider Order: Non weight bearing Other Position/Activity Restrictions: in post op shoes       Mobility  Bed Mobility Overal bed mobility: Modified Independent                  Transfers Overall transfer level: Needs assistance Equipment used: None Transfers: Bed to chair/wheelchair/BSC   Stand pivot transfers: Min assist         General transfer comment: Pt noncompliant with RLE NWB.    Ambulation/Gait               General Gait Details: unable due to RLE NWB  Stairs            Wheelchair Mobility     Tilt Bed    Modified Rankin (Stroke Patients Only)       Balance Overall balance assessment: Needs assistance Sitting-balance support: No upper extremity supported, Feet supported Sitting balance-Leahy Scale: Good     Standing balance support: Bilateral upper extremity supported, During functional activity Standing balance-Leahy Scale: Poor                               Pertinent Vitals/Pain Pain Assessment Pain Assessment: No/denies pain    Home Living Family/patient expects to be discharged to:: Private residence Living Arrangements: Parent Available Help at Discharge: Family;Available 24 hours/day Type of Home: House Home Access: Ramped entrance       Home Layout: One level Home Equipment: Shower seat;Cane - single point;Rolling Walker (2 wheels);Wheelchair - manual;Wheelchair - power;Grab bars - toilet;BSC/3in1      Prior Function Prior Level of Function : Needs assist             Mobility Comments:  w/c at baseline, self propels vs being pushed ADLs Comments: family assists as needed     Extremity/Trunk Assessment   Upper Extremity Assessment Upper Extremity Assessment: Overall WFL for tasks assessed    Lower Extremity Assessment Lower Extremity Assessment: Generalized weakness;RLE deficits/detail;LLE deficits/detail RLE Deficits / Details: s/p TMA 11/14, dressing in place LLE Deficits / Details: s/p TMA Oct 2025, dressing in place    Cervical / Trunk Assessment Cervical / Trunk  Assessment: Kyphotic  Communication   Communication Communication: No apparent difficulties    Cognition Arousal: Alert Behavior During Therapy: WFL for tasks assessed/performed   PT - Cognitive impairments: No family/caregiver present to determine baseline, Awareness, Sequencing, Problem solving, Safety/Judgement                         Following commands: Impaired Following commands impaired: Only follows one step commands consistently     Cueing Cueing Techniques: Verbal cues     General Comments      Exercises     Assessment/Plan    PT Assessment Patient does not need any further PT services  PT Problem List         PT Treatment Interventions      PT Goals (Current goals can be found in the Care Plan section)  Acute Rehab PT Goals Patient Stated Goal: home today PT Goal Formulation: All assessment and education complete, DC therapy    Frequency       Co-evaluation               AM-PAC PT 6 Clicks Mobility  Outcome Measure Help needed turning from your back to your side while in a flat bed without using bedrails?: None Help needed moving from lying on your back to sitting on the side of a flat bed without using bedrails?: None Help needed moving to and from a bed to a chair (including a wheelchair)?: A Little Help needed standing up from a chair using your arms (e.g., wheelchair or bedside chair)?: A Little Help needed to walk in hospital room?: Total Help needed climbing 3-5 steps with a railing? : Total 6 Click Score: 16    End of Session Equipment Utilized During Treatment: Gait belt Activity Tolerance: Patient tolerated treatment well Patient left: in bed;with call bell/phone within reach;with bed alarm set Nurse Communication: Mobility status PT Visit Diagnosis: Other abnormalities of gait and mobility (R26.89)    Time: 1202-1218 PT Time Calculation (min) (ACUTE ONLY): 16 min   Charges:   PT Evaluation $PT Eval Low  Complexity: 1 Low   PT General Charges $$ ACUTE PT VISIT: 1 Visit         Sari MATSU., PT  Office # [phone]   [name] [name] 04/27/2024, 12:52 PM

## 2024-04-27 NOTE — Progress Notes (Addendum)
  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 Normal mood and affect.  Vascular Bilateral feet are warm to the ankle  Neurologic Epicritic sensation grossly reduced.  Dermatologic Dressings are clean dry and intact  Orthopedic: MMT 5/5 in dorsiflexion, plantarflexion, inversion, and eversion. Normal joint ROM without pain or crepitus.    Assessment & Plan:  Patient was evaluated and treated and all questions answered.  PAD, bilateral foot infections and gangrene with TMA - Weightbearing as tolerated to left in surgical shoe and nonweightbearing on right in surgical shoe.  Orders placed - With his MRSA history would recommend 10 days doxycycline  100 mg twice daily -Leave dressing intact until Monday and then change every 2 to 3 days.  Bilaterally paint to both incisions with Betadine and then dry sterile dressings.  Instructions left in discharge paperwork.  He states his mother can help him change dressings -Will have office contact him for follow-up in 1 to 2 weeks   Juliene JONELLE Medicine, DPM  Accessible via secure chat for questions or concerns.

## 2024-04-28 NOTE — Discharge Planning (Signed)
 [facility] Patient Discharge Orders- [facility] CLINIC: [facility]  Patient's name: [name] [name] Admit/DC Dates: 04/25/2024 - 04/27/2024  Discharge Diagnoses: Gangrene R foot s/p TMA   Aranesp : Given: no   Date and amount of last dose: N/A  Last Hgb: 9.6 PRBC's Given: no Date/# of units: N/A ESA dose for discharge: same dose IV Iron dose at discharge: same dose  Heparin  change: Yes- hold heparin  for 1 week then resume  EDW Change: yes New EDW: 62,2kg  Bath Change: no  Access intervention/Change: no Details:  Hectorol/Calcitriol change: no  Discharge Labs: Calcium  7.3 Phosphorus 2.7 Albumin  2.2 K+ 3.6  IV Antibiotics: no Details:  On Coumadin ?: no Last INR: Next INR: Managed By:   OTHER/APPTS/LAB ORDERS:    D/C Meds to be reconciled by nurse after every discharge.  Completed By: Lucie Collet, PA-C 04/28/2024, 4:13 PM   [facility] Pager: [phone]   Reviewed by: MD:______ RN_______

## 2024-04-29 DIAGNOSIS — Z992 Dependence on renal dialysis: Secondary | ICD-10-CM | POA: Diagnosis not present

## 2024-04-29 DIAGNOSIS — Z4901 Encounter for fitting and adjustment of extracorporeal dialysis catheter: Secondary | ICD-10-CM | POA: Diagnosis not present

## 2024-04-29 DIAGNOSIS — N186 End stage renal disease: Secondary | ICD-10-CM | POA: Diagnosis not present

## 2024-04-29 DIAGNOSIS — D689 Coagulation defect, unspecified: Secondary | ICD-10-CM | POA: Diagnosis not present

## 2024-04-29 DIAGNOSIS — N2581 Secondary hyperparathyroidism of renal origin: Secondary | ICD-10-CM | POA: Diagnosis not present

## 2024-04-29 NOTE — Progress Notes (Signed)
 Late note entry 11/17 10:05  Dc over weekend noted. Contacted out-pt HD clinic, [facility], to inform that pt has d/c and should return this afternoon. No further support needed.   Ryken Paschal Dialysis Navigator [phone]

## 2024-04-30 LAB — SURGICAL PATHOLOGY

## 2024-05-01 LAB — HEPATITIS B SURFACE ANTIBODY, QUANTITATIVE: Hep B S AB Quant (Post): 4264 m[IU]/mL

## 2024-05-02 ENCOUNTER — Encounter: Payer: Self-pay | Admitting: Podiatry

## 2024-05-02 ENCOUNTER — Ambulatory Visit (INDEPENDENT_AMBULATORY_CARE_PROVIDER_SITE_OTHER): Admitting: Podiatry

## 2024-05-02 DIAGNOSIS — I96 Gangrene, not elsewhere classified: Secondary | ICD-10-CM

## 2024-05-02 DIAGNOSIS — I739 Peripheral vascular disease, unspecified: Secondary | ICD-10-CM

## 2024-05-02 NOTE — Progress Notes (Signed)
  Subjective:  Patient ID: [name] [name], male    DOB: 1961/04/27,  MRN: [id number]  1 week right foot transmetatarsal amputation date of surgery 04/25/2024  had left foot TMA on 03/25/2024  Patient presenting for follow-up visit on the right foot which is 1 week postoperative.  Left foot had sutures removed at prior office visit.  Left foot procedure was on 03/25/2024.  He is now nonweightbearing on the right foot and weightbearing as tolerated on the left foot and bilateral postop shoes.  Negative for chest pain and shortness of breath Constitutional signs: no Review of all other systems is negative Objective:   There were no vitals filed for this visit.  General AA&O x3. Normal mood and affect.  Vascular Bilateral feet are warm to the ankle  Neurologic Epicritic sensation grossly reduced.  Dermatologic Right foot TMA site well coapted no dehiscence no drainage no evidence of infection however there is discoloration of the amputation site concerning for early necrosis.  On the left foot there is a necrotic area at the medial central aspect of the amputation site as well as a superficial dehiscence medially though relatively appears stable or improved from prior without evidence of infection.  Will continue with Betadine dressing changes       Orthopedic: MMT 5/5 in dorsiflexion, plantarflexion, inversion, and eversion. Normal joint ROM without pain or crepitus.    Assessment & Plan:  Patient was evaluated and treated and all questions answered.  PAD, bilateral foot infections and gangrene with TMA left foot on 03/25/2024 and then right foot on 04/25/2024 - Weightbearing as tolerated to left in surgical shoe and nonweightbearing on right in surgical shoe.  - Currently on doxycycline  100 mg twice daily for 10 days after this monitor off antibiotics -Both the right and left foot are at risk for more proximal amputation given necrotic changes at the amputation site.  I am referring  patient to the wound care center for possible hyperbaric oxygen therapy to increase healing potential. - Recommend dressing change every  2 to 3 days.  Bilaterally paint to both incisions with Betadine and then dry sterile dressings.    - Patient to follow-up in 2 weeks   [name], DPM  Accessible via secure chat for questions or concerns.

## 2024-05-05 DIAGNOSIS — N186 End stage renal disease: Secondary | ICD-10-CM | POA: Diagnosis not present

## 2024-05-05 DIAGNOSIS — N2581 Secondary hyperparathyroidism of renal origin: Secondary | ICD-10-CM | POA: Diagnosis not present

## 2024-05-05 DIAGNOSIS — Z4901 Encounter for fitting and adjustment of extracorporeal dialysis catheter: Secondary | ICD-10-CM | POA: Diagnosis not present

## 2024-05-05 DIAGNOSIS — Z992 Dependence on renal dialysis: Secondary | ICD-10-CM | POA: Diagnosis not present

## 2024-05-05 DIAGNOSIS — D689 Coagulation defect, unspecified: Secondary | ICD-10-CM | POA: Diagnosis not present

## 2024-05-12 DIAGNOSIS — I120 Hypertensive chronic kidney disease with stage 5 chronic kidney disease or end stage renal disease: Secondary | ICD-10-CM | POA: Diagnosis not present

## 2024-05-12 DIAGNOSIS — Z992 Dependence on renal dialysis: Secondary | ICD-10-CM | POA: Diagnosis not present

## 2024-05-12 DIAGNOSIS — N186 End stage renal disease: Secondary | ICD-10-CM | POA: Diagnosis not present

## 2024-05-13 ENCOUNTER — Encounter (HOSPITAL_COMMUNITY): Payer: Self-pay

## 2024-05-13 ENCOUNTER — Emergency Department (HOSPITAL_COMMUNITY)
Admission: EM | Admit: 2024-05-13 | Discharge: 2024-05-13 | Disposition: A | Discharge status: Home / Self Care | Attending: Emergency Medicine | Admitting: Emergency Medicine

## 2024-05-13 ENCOUNTER — Other Ambulatory Visit: Payer: Self-pay

## 2024-05-13 ENCOUNTER — Emergency Department (HOSPITAL_COMMUNITY)

## 2024-05-13 DIAGNOSIS — Z992 Dependence on renal dialysis: Secondary | ICD-10-CM | POA: Diagnosis not present

## 2024-05-13 DIAGNOSIS — J984 Other disorders of lung: Secondary | ICD-10-CM | POA: Diagnosis not present

## 2024-05-13 DIAGNOSIS — R404 Transient alteration of awareness: Secondary | ICD-10-CM | POA: Diagnosis not present

## 2024-05-13 DIAGNOSIS — R059 Cough, unspecified: Secondary | ICD-10-CM | POA: Diagnosis not present

## 2024-05-13 DIAGNOSIS — N186 End stage renal disease: Secondary | ICD-10-CM | POA: Diagnosis not present

## 2024-05-13 DIAGNOSIS — R410 Disorientation, unspecified: Secondary | ICD-10-CM | POA: Diagnosis not present

## 2024-05-13 DIAGNOSIS — R4182 Altered mental status, unspecified: Secondary | ICD-10-CM | POA: Diagnosis not present

## 2024-05-13 DIAGNOSIS — I6782 Cerebral ischemia: Secondary | ICD-10-CM | POA: Insufficient documentation

## 2024-05-13 DIAGNOSIS — R0989 Other specified symptoms and signs involving the circulatory and respiratory systems: Secondary | ICD-10-CM | POA: Diagnosis not present

## 2024-05-13 DIAGNOSIS — J9811 Atelectasis: Secondary | ICD-10-CM | POA: Diagnosis not present

## 2024-05-13 DIAGNOSIS — I672 Cerebral atherosclerosis: Secondary | ICD-10-CM | POA: Diagnosis not present

## 2024-05-13 LAB — COMPREHENSIVE METABOLIC PANEL WITH GFR
ALT: 11 U/L (ref 0–44)
AST: 23 U/L (ref 15–41)
Albumin: 2.7 g/dL — ABNORMAL LOW (ref 3.5–5.0)
Alkaline Phosphatase: 108 U/L (ref 38–126)
Anion gap: 16 — ABNORMAL HIGH (ref 5–15)
BUN: 33 mg/dL — ABNORMAL HIGH (ref 8–23)
CO2: 21 mmol/L — ABNORMAL LOW (ref 22–32)
Calcium: 8.9 mg/dL (ref 8.9–10.3)
Chloride: 102 mmol/L (ref 98–111)
Creatinine, Ser: 7.53 mg/dL — ABNORMAL HIGH (ref 0.61–1.24)
GFR, Estimated: 7 mL/min — ABNORMAL LOW (ref >60)
Glucose, Bld: 156 mg/dL — ABNORMAL HIGH (ref 70–99)
Potassium: 4.9 mmol/L (ref 3.5–5.1)
Sodium: 139 mmol/L (ref 135–145)
Total Bilirubin: 0.8 mg/dL (ref 0.0–1.2)
Total Protein: 6.8 g/dL (ref 6.5–8.1)

## 2024-05-13 LAB — PROTIME-INR
INR: 1.2 (ref 0.8–1.2)
Prothrombin Time: 15.4 s — ABNORMAL HIGH (ref 11.4–15.2)

## 2024-05-13 LAB — CBC WITH DIFFERENTIAL/PLATELET
Abs Immature Granulocytes: 0.07 K/uL (ref 0.00–0.07)
Basophils Absolute: 0.1 K/uL (ref 0.0–0.1)
Basophils Relative: 2 %
Eosinophils Absolute: 0.1 K/uL (ref 0.0–0.5)
Eosinophils Relative: 2 %
HCT: 34.4 % — ABNORMAL LOW (ref 39.0–52.0)
Hemoglobin: 10.2 g/dL — ABNORMAL LOW (ref 13.0–17.0)
Immature Granulocytes: 1 %
Lymphocytes Relative: 24 %
Lymphs Abs: 1.8 K/uL (ref 0.7–4.0)
MCH: 29.7 pg (ref 26.0–34.0)
MCHC: 29.7 g/dL — ABNORMAL LOW (ref 30.0–36.0)
MCV: 100.3 fL — ABNORMAL HIGH (ref 80.0–100.0)
Monocytes Absolute: 0.4 K/uL (ref 0.1–1.0)
Monocytes Relative: 6 %
Neutro Abs: 4.8 K/uL (ref 1.7–7.7)
Neutrophils Relative %: 65 %
Platelets: 167 K/uL (ref 150–400)
RBC: 3.43 MIL/uL — ABNORMAL LOW (ref 4.22–5.81)
RDW: 16.9 % — ABNORMAL HIGH (ref 11.5–15.5)
WBC: 7.3 K/uL (ref 4.0–10.5)
nRBC: 0 % (ref 0.0–0.2)

## 2024-05-13 LAB — RESP PANEL BY RT-PCR (RSV, FLU A&B, COVID)  RVPGX2
Influenza A by PCR: NEGATIVE
Influenza B by PCR: NEGATIVE
Resp Syncytial Virus by PCR: NEGATIVE
SARS Coronavirus 2 by RT PCR: NEGATIVE

## 2024-05-13 LAB — I-STAT CG4 LACTIC ACID, ED: Lactic Acid, Venous: 1 mmol/L (ref 0.5–1.9)

## 2024-05-13 NOTE — ED Notes (Signed)
 Pt aware we need urine sample, unable to provide urine at this time. Pt has urine cup

## 2024-05-13 NOTE — Discharge Instructions (Signed)
 It is unclear what caused the confusion last night and hallucinations.  It may have been related to your medicines.  Have caution when using the oxycodone  for pain.  Follow-up with your primary care physician urgently as an outpatient.  If you develop new or worsening symptoms or any other new/concerning symptoms then return to the ER.

## 2024-05-13 NOTE — ED Notes (Signed)
 Family at SORT desk stating that he had no eaten or drank anything since yesterday and wondering how much longer. Offered to take pt blood sugar but pt refused.

## 2024-05-13 NOTE — ED Provider Notes (Signed)
 [facility] Provider Note   CSN: [id number] Arrival date & time: 05/13/24  1058     Patient presents with: No chief complaint on file.   [name] [name] is a 63 y.o. male.   HPI 63 year old male presents with transient altered mental status.  History is from patient and mom.  The patient received oxycodone  because his foot was hurting as he recently had surgery.  He has not been taking much of the oxycodone  but he took this in combination with his other nightly meds.  Shortly thereafter he started having what seems to be hallucinations, seeing a woman that was not there and otherwise acting strangely.  This lasted several hours throughout the night.  Today the patient is now back to normal.  He does dialysis on MWF and missed today as he came to the ER instead.  He is currently back to normal and has no acute complaints besides his foot bothering him.  No recent fevers.  He has a chronic but unchanged cough.  He denies any other acute pain.  He only urinates about twice a day and urinated earlier this morning.  He has not had any dysuria.  Both feet are wrapped, patient is asking me not to unwrap them but he and mom state that the wounds are healing well and there has been no abnormal discharge.  Prior to Admission medications   Medication Sig Start Date End Date Taking? Authorizing Provider  Accu-Chek Softclix Lancets lancets  08/06/20   [provider]  acetaminophen  (TYLENOL ) 500 MG tablet Take 500-1,000 mg by mouth every 8 (eight) hours as needed for moderate pain (pain score 4-6).    [provider]  amitriptyline  (ELAVIL ) 10 MG tablet Take 10 mg by mouth at bedtime.    [provider]  amLODipine  (NORVASC ) 10 MG tablet Take 1 tablet (10 mg total) by mouth every morning. 03/29/23   Regalado, Belkys A, MD  aspirin  (ASPIRIN  CHILDRENS) 81 MG chewable tablet Chew 1 tablet (81 mg total) by mouth daily. Patient taking  differently: Chew 81 mg by mouth every evening. 01/30/24 01/29/25  Serene Gaile ORN, MD  atorvastatin  (LIPITOR) 40 MG tablet Take 1 tablet (40 mg total) by mouth daily. 03/28/24   Gonfa, Taye T, MD  calcium  acetate (PHOSLO ) 667 MG capsule Take 667 mg by mouth 3 (three) times daily with meals. 07/07/23 06/27/24  [provider]  clopidogrel  (PLAVIX ) 75 MG tablet Take 1 tablet (75 mg total) by mouth daily. 01/30/24   Serene Gaile ORN, MD  Cyanocobalamin  (VITAMIN B12 PO) Take 1 tablet by mouth at bedtime.    [provider]  gabapentin  (NEURONTIN ) 100 MG capsule Take 100 mg by mouth at bedtime. 07/05/23   [provider]  levothyroxine  (SYNTHROID ) 50 MCG tablet Take 50 mcg by mouth daily before breakfast.    [provider]  metoprolol  tartrate (LOPRESSOR ) 50 MG tablet Take 1 tablet (50 mg total) by mouth 2 (two) times daily. 06/16/20   Comer Kirsch, PA-C  Multiple Vitamin (MULTIVITAMIN WITH MINERALS) TABS tablet Take 1 tablet by mouth daily. Men's Multivitamin for 50+    [provider]  oxyCODONE  (OXY IR/ROXICODONE ) 5 MG immediate release tablet Take 1 tablet (5 mg total) by mouth every 4 (four) hours as needed for moderate pain (pain score 4-6). 04/27/24   Cindy Garnette POUR, MD  pantoprazole  (PROTONIX ) 40 MG tablet Take 1 tablet (40 mg total) by mouth daily. 30 minutes  before breakfast 06/01/23   Shirlean Therisa ORN, NP  Probiotic Product (PROBIOTIC DAILY PO) Take 1 capsule by mouth at bedtime.    [provider]  Senna (SENOKOT LAXATIVE GUMMIES) 8.7 MG CHEW Chew 1-4 each by mouth daily as needed (constipation.).    [provider]    Allergies: Patient has no known allergies.    Review of Systems  Constitutional:  Negative for fever.  Respiratory:  Negative for shortness of breath.   Cardiovascular:  Negative for chest pain.  Genitourinary:  Negative for dysuria.  Neurological:  Negative for headaches.  Psychiatric/Behavioral:  Positive for  confusion.     Updated Vital Signs BP (!) 151/64   Pulse 66   Temp 97.9 F (36.6 C) (Oral)   Resp 20   Ht 5' 10 (1.778 m)   Wt 72.6 kg   SpO2 94%   BMI 22.96 kg/m   Physical Exam Vitals and nursing note reviewed.  Constitutional:      Appearance: He is well-developed.  HENT:     Head: Normocephalic and atraumatic.  Eyes:     Extraocular Movements: Extraocular movements intact.  Cardiovascular:     Rate and Rhythm: Normal rate and regular rhythm.     Heart sounds: Normal heart sounds.  Pulmonary:     Effort: Pulmonary effort is normal.     Breath sounds: Normal breath sounds.  Abdominal:     Palpations: Abdomen is soft.     Tenderness: There is no abdominal tenderness.  Musculoskeletal:     Cervical back: No rigidity.  Skin:    General: Skin is warm and dry.  Neurological:     Mental Status: He is alert.     Comments: Alert and oriented to person, place, date, situation. CN 3-12 grossly intact. 5/5 strength in all 4 extremities. Grossly normal sensation. Normal finger to nose.      (all labs ordered are listed, but only abnormal results are displayed) Labs Reviewed  COMPREHENSIVE METABOLIC PANEL WITH GFR - Abnormal; Notable for the following components:      Result Value   CO2 21 (*)    Glucose, Bld 156 (*)    BUN 33 (*)    Creatinine, Ser 7.53 (*)    Albumin  2.7 (*)    GFR, Estimated 7 (*)    Anion gap 16 (*)    All other components within normal limits  CBC WITH DIFFERENTIAL/PLATELET - Abnormal; Notable for the following components:   RBC 3.43 (*)    Hemoglobin 10.2 (*)    HCT 34.4 (*)    MCV 100.3 (*)    MCHC 29.7 (*)    RDW 16.9 (*)    All other components within normal limits  PROTIME-INR - Abnormal; Notable for the following components:   Prothrombin Time 15.4 (*)    All other components within normal limits  RESP PANEL BY RT-PCR (RSV, FLU A&B, COVID)  RVPGX2  CULTURE, BLOOD (ROUTINE X 2)  CULTURE, BLOOD (ROUTINE X 2)  URINALYSIS, W/ REFLEX  TO CULTURE (INFECTION SUSPECTED)  I-STAT CG4 LACTIC ACID, ED    EKG: None  Radiology: CT Head Wo Contrast Result Date: 05/13/2024 CLINICAL DATA:  Mental status change, unknown cause EXAM: CT HEAD WITHOUT CONTRAST TECHNIQUE: Contiguous axial images were obtained from the base of the skull through the vertex without intravenous contrast. RADIATION DOSE REDUCTION: This exam was performed according to the departmental dose-optimization program which includes automated exposure control, adjustment of the mA and/or kV according to  patient size and/or use of iterative reconstruction technique. COMPARISON:  CT 03/06/2023 FINDINGS: Brain: No intracranial hemorrhage, mass effect, or midline shift. Generalized atrophy, stable from prior. No hydrocephalus. The basilar cisterns are patent. Stable degree of chronic small vessel ischemia. Suspected prominent perivascular space in the right lateral basal ganglia. No evidence of territorial infarct or acute ischemia. No extra-axial or intracranial fluid collection. Vascular: Atherosclerosis of skullbase vasculature without hyperdense vessel or abnormal calcification. Skull: No fracture or focal lesion. Sinuses/Orbits: No acute finding. Other: None. IMPRESSION: 1. No acute intracranial abnormality. 2. Stable atrophy and chronic small vessel ischemia. Electronically Signed   By: Andrea Gasman M.D.   On: 05/13/2024 17:28   DG Chest 2 View Result Date: 05/13/2024 EXAM: 2 VIEW(S) XRAY OF THE CHEST 05/13/2024 12:31:00 PM COMPARISON: 09/03/2023 CLINICAL HISTORY: confusion FINDINGS: LINES, TUBES AND DEVICES: Dialysis catheter tip at mid right atrium. LUNGS AND PLEURA: Mild pulmonary venous congestion. Mild left base airspace disease. No pleural effusion. No pneumothorax. HEART AND MEDIASTINUM: Mild cardiomegaly. Mild pulmonary venous congestion. BONES AND SOFT TISSUES: Left axillary surgical clips. No acute osseous abnormality. LIMITATIONS/ARTIFACTS: Motion degraded lateral  view. IMPRESSION: 1. Mild cardiomegaly with mild pulmonary venous congestion. 2. Left base airspace disease, favoring atelectasis Electronically signed by: Rockey Kilts MD 05/13/2024 01:49 PM EST RP Workstation: [id number]     Procedures   Medications Ordered in the ED - No data to display                                  Medical Decision Making Amount and/or Complexity of Data Reviewed Labs: ordered.    Details: Normal lactate.  Normal potassium. Radiology: ordered.    Details: No head bleed. ECG/medicine tests: ordered and independent interpretation performed.    Details: No ischemia   Patient presents with hallucinations and confusion last night but now resolved.  Workup is overall unremarkable including normal head CT and unremarkable labs compared to baseline.  He was unable to produce urine but this is not a typical for him with his ESRD state.  Based on presentation and timing and his multiple medications I suspect this was medication related as he is not taking the oxycodone  often and has not combine it with his night meds before which includes gabapentin .  Symptoms are completely resolved and he has an unremarkable mental status and neuroexam here.  I do not think MRI or admission are needed.  Highly doubt infection at this time.  X-ray was questioning some atelectasis versus pneumonia but he clinically does not have pneumonia.  He does not appear to have clinical volume overload either even though he missed dialysis today and can follow-up with dialysis as an outpatient.  Will discharge home with return precautions.     Final diagnoses:  Altered awareness, transient    ED Discharge Orders     None          Freddi Hamilton, MD 05/13/24 2026

## 2024-05-13 NOTE — ED Provider Triage Note (Signed)
 Emergency Medicine Provider Triage Evaluation Note  ROCKIE SCHNOOR , a 63 y.o. male  was evaluated in triage.  Pt complains of confusion. Per wife, pt's last dialysis was 3 days ago and was suppose to go today however last night he was hallucinating and seeing things which concerns her.  No report of fever, chills, cough, urinary discomfort or sob.  Still making urine.  Pt has had multiple toes amputations recently and currently going through with wound care.    Review of Systems  Positive: As above Negative: As above  Physical Exam  BP (!) 157/91   Pulse 72   Temp 97.9 F (36.6 C)   Resp 16   Ht 5' 10 (1.778 m)   Wt 72.6 kg   SpO2 (!) 89%   BMI 22.96 kg/m  Gen:   Awake, no distress   Resp:  Normal effort  MSK:   Moves extremities without difficulty  Other:  Ace wrap to both feet  Medical Decision Making  Medically screening exam initiated at 12:06 PM.  Appropriate orders placed.  SUNG PARODI was informed that the remainder of the evaluation will be completed by another provider, this initial triage assessment does not replace that evaluation, and the importance of remaining in the ED until their evaluation is complete.  No hypoxia as a recheck O2 was normal.    Nivia Colon, PA-C 05/13/24 1209

## 2024-05-13 NOTE — ED Notes (Signed)
 Unable to urinate and obtain blood dialysis patient x2 at rn tori aware

## 2024-05-13 NOTE — ED Triage Notes (Addendum)
 Wife states pt is talking out of head and is agitated since yesterday. Axox1. Pt is very agitated in triage. Denies CP and SHOB. Last dialysis tx was Friday.

## 2024-05-14 ENCOUNTER — Encounter (HOSPITAL_BASED_OUTPATIENT_CLINIC_OR_DEPARTMENT_OTHER): Admitting: General Surgery

## 2024-05-15 DIAGNOSIS — N186 End stage renal disease: Secondary | ICD-10-CM | POA: Diagnosis not present

## 2024-05-15 DIAGNOSIS — N2581 Secondary hyperparathyroidism of renal origin: Secondary | ICD-10-CM | POA: Diagnosis not present

## 2024-05-15 DIAGNOSIS — D689 Coagulation defect, unspecified: Secondary | ICD-10-CM | POA: Diagnosis not present

## 2024-05-15 DIAGNOSIS — Z992 Dependence on renal dialysis: Secondary | ICD-10-CM | POA: Diagnosis not present

## 2024-05-15 DIAGNOSIS — Z4901 Encounter for fitting and adjustment of extracorporeal dialysis catheter: Secondary | ICD-10-CM | POA: Diagnosis not present

## 2024-05-16 ENCOUNTER — Ambulatory Visit: Admitting: Podiatry

## 2024-05-16 ENCOUNTER — Ambulatory Visit (INDEPENDENT_AMBULATORY_CARE_PROVIDER_SITE_OTHER)

## 2024-05-16 DIAGNOSIS — I96 Gangrene, not elsewhere classified: Secondary | ICD-10-CM

## 2024-05-16 DIAGNOSIS — I739 Peripheral vascular disease, unspecified: Secondary | ICD-10-CM

## 2024-05-16 DIAGNOSIS — N186 End stage renal disease: Secondary | ICD-10-CM | POA: Diagnosis not present

## 2024-05-16 DIAGNOSIS — E1122 Type 2 diabetes mellitus with diabetic chronic kidney disease: Secondary | ICD-10-CM | POA: Diagnosis not present

## 2024-05-16 DIAGNOSIS — E785 Hyperlipidemia, unspecified: Secondary | ICD-10-CM | POA: Diagnosis not present

## 2024-05-16 DIAGNOSIS — I429 Cardiomyopathy, unspecified: Secondary | ICD-10-CM | POA: Diagnosis not present

## 2024-05-16 DIAGNOSIS — E039 Hypothyroidism, unspecified: Secondary | ICD-10-CM | POA: Diagnosis not present

## 2024-05-16 DIAGNOSIS — Z7409 Other reduced mobility: Secondary | ICD-10-CM | POA: Diagnosis not present

## 2024-05-16 DIAGNOSIS — Z89429 Acquired absence of other toe(s), unspecified side: Secondary | ICD-10-CM | POA: Diagnosis not present

## 2024-05-16 DIAGNOSIS — I10 Essential (primary) hypertension: Secondary | ICD-10-CM | POA: Diagnosis not present

## 2024-05-16 NOTE — Patient Instructions (Signed)
 Instructions for Wound Care  The most important step to healing a foot wound is to reduce the pressure on your foot - it is extremely important to stay off your foot as much as possible and wear the shoe/boot as instructed.  Keep feet clean and dry.  Apply prescribed betadine gauze solution to your wound and cover with gauze and a bandage.  Left foot may gently pack Betadine gauze using Q-tip to assist with packing.  May hold bandage in place with  Ace bandage under no compression or tape.  You may find dressing supplies at your local [employer], [employer], drug store or medical supply store.  Monitor for any signs/symptoms of infection. If there is any increase in redness, red streaks, increase in drainage, warmth to your foot please give us  a call. Also, if you start to run a fever or have flu-like symptoms that can also be a sign of infection. Call the office immediately if any occur or go directly to the emergency room.   If you have any questions, please feel free to give us  a call at [phone] or if you are on MyChart you can always send me a message if needed.

## 2024-05-16 NOTE — Progress Notes (Signed)
  Subjective:  Patient ID: [name] [name], male    DOB: [birth year],  MRN: [id number]  1 week right foot transmetatarsal amputation date of surgery 04/25/2024  had left foot TMA on 03/25/2024  Patient is following up for bilateral TMA's with ischemic changes.  He has been limited weightbearing.  He is pending being established with wound care center, per chart review has upcoming appointment  June 03, 2024.  His mother is with the patient today who assist in his care.  She feels that the wounds are doing okay.  Negative for chest pain and shortness of breath Constitutional signs: no Review of all other systems is negative Objective:   There were no vitals filed for this visit.  General AA&O x3. Normal mood and affect.  Vascular Bilateral feet are warm to the ankle  Neurologic Epicritic sensation grossly reduced.  Dermatologic Sutures and staples are intact to the right foot TMA site.  Skin edges appear well coapted.  There is continued darkened skin changes suggesting ischemia and necrosis.  Crescent-shaped area of ischemic tissue present medial plantar portion.  On the left foot there is a necrotic area at the medial central aspect of the amputation site this does have dehiscence that does probe deep approximately 2 cm into bone.  Area measures approximately 3 cm wide by 0.4 cm in length.  Fibrous slough and scant serous drainage present.  No erythema or focal warmth increased.  No odor.  Media was obtained today but did not save properly.     Orthopedic: MMT 5/5 in dorsiflexion, plantarflexion, inversion, and eversion. Normal joint ROM without pain or crepitus.   Radiographs: Left foot 3 views Examination somewhat limited by patient positioning.  No evidence of free air associated with the area of dehiscence about the 1st and 2nd metatarsals.  No osteolysis or cortical erosion noted.  No significant periosteal reaction.  Calcified vessels.  Status post transmetatarsal  amputation.  Assessment & Plan:  Patient was evaluated and treated and all questions answered.  PAD, bilateral foot infections and gangrene with TMA left foot on 03/25/2024 and then right foot on 04/25/2024 - Weightbearing as tolerated to left in surgical shoe and nonweightbearing on right in surgical shoe.  - Has completed course of doxycycline .  Will continue to monitor off antibiotics. -Both the right and left foot are at risk for more proximal amputation given necrotic changes at the amputation site.  He is awaiting first appointment with wound care clinic to start hyperbaric oxygen therapy. - Betadine wet-to-dry dressings were applied bilaterally.  Betadine gauze lightly packed into the left foot dehiscence site.  Advised that they increase dressing changes to daily if possible. -Right foot sutures were removed today.  The staples were left intact due to the chronic ischemic changes to the incision site in an effort that there may be some healing progression of deeper tissues. - Reevaluate in 1 to 2 weeks with Dr. Newton.   Ethan LITTIE Saddler, DPM

## 2024-05-18 ENCOUNTER — Other Ambulatory Visit: Payer: Self-pay | Admitting: Gastroenterology

## 2024-05-18 LAB — CULTURE, BLOOD (ROUTINE X 2)
Culture: NO GROWTH
Culture: NO GROWTH

## 2024-05-20 ENCOUNTER — Encounter: Payer: Self-pay | Admitting: Podiatry

## 2024-05-23 DIAGNOSIS — E113512 Type 2 diabetes mellitus with proliferative diabetic retinopathy with macular edema, left eye: Secondary | ICD-10-CM | POA: Diagnosis not present

## 2024-05-23 DIAGNOSIS — Z961 Presence of intraocular lens: Secondary | ICD-10-CM | POA: Diagnosis not present

## 2024-05-23 DIAGNOSIS — H43813 Vitreous degeneration, bilateral: Secondary | ICD-10-CM | POA: Diagnosis not present

## 2024-05-23 DIAGNOSIS — E113211 Type 2 diabetes mellitus with mild nonproliferative diabetic retinopathy with macular edema, right eye: Secondary | ICD-10-CM | POA: Diagnosis not present

## 2024-05-27 ENCOUNTER — Ambulatory Visit

## 2024-05-30 ENCOUNTER — Ambulatory Visit: Admitting: Podiatry

## 2024-05-30 ENCOUNTER — Encounter: Payer: Self-pay | Admitting: Podiatry

## 2024-05-30 ENCOUNTER — Ambulatory Visit

## 2024-05-30 DIAGNOSIS — I96 Gangrene, not elsewhere classified: Secondary | ICD-10-CM

## 2024-05-30 DIAGNOSIS — Z9889 Other specified postprocedural states: Secondary | ICD-10-CM

## 2024-05-30 DIAGNOSIS — T8130XA Disruption of wound, unspecified, initial encounter: Secondary | ICD-10-CM | POA: Diagnosis not present

## 2024-05-30 NOTE — Progress Notes (Unsigned)
°  Subjective:  Patient ID: [name] [name], male    DOB: 05/18/1961,  MRN: [id number]  1 week right foot transmetatarsal amputation date of surgery 04/25/2024  had left foot TMA on 03/25/2024  Patient is following up for bilateral TMA's with ischemic changes.  He has been limited weightbearing.  He is pending being established with wound care center, per chart review has upcoming appointment  June 03, 2024.  His mother is with the patient today who assist in his care.  She feels that the wounds are doing okay.  Negative for chest pain and shortness of breath Constitutional signs: no Review of all other systems is negative Objective:   There were no vitals filed for this visit.  General AA&O x3. Normal mood and affect.  Vascular Bilateral feet are warm to the ankle  Neurologic Epicritic sensation grossly reduced.  Dermatologic Sutures and staples are intact to the right foot TMA site.  Skin edges appear well coapted.  There is continued darkened skin changes suggesting ischemia and necrosis.  Crescent-shaped area of ischemic tissue present medial plantar portion.  On the left foot there is a necrotic area at the medial central aspect of the amputation site this does have dehiscence that does probe deep approximately 2 cm into bone.  Area measures approximately 3 cm wide by 0.4 cm in length.  Fibrous slough and scant serous drainage present.  No erythema or focal warmth increased.  No odor.  Media was obtained today but did not save properly.     Orthopedic: MMT 5/5 in dorsiflexion, plantarflexion, inversion, and eversion. Normal joint ROM without pain or crepitus.   Radiographs: Left foot 3 views Examination somewhat limited by patient positioning.  No evidence of free air associated with the area of dehiscence about the 1st and 2nd metatarsals.  No osteolysis or cortical erosion noted.  No significant periosteal reaction.  Calcified vessels.  Status post transmetatarsal  amputation.  Assessment & Plan:  Patient was evaluated and treated and all questions answered.  PAD, bilateral foot infections and gangrene with TMA left foot on 03/25/2024 and then right foot on 04/25/2024 -   -Both the right and left foot are at risk for more proximal amputation given necrotic changes at the amputation site.    - Betadine wet-to-dry dressings were applied bilaterally.  Betadine gauze lightly packed into the left foot dehiscence site.    - The staples were removed on the right foot today - Reevaluate in 2 weeks   [name] [name], DPM

## 2024-06-03 ENCOUNTER — Other Ambulatory Visit: Payer: Self-pay

## 2024-06-03 ENCOUNTER — Encounter (HOSPITAL_BASED_OUTPATIENT_CLINIC_OR_DEPARTMENT_OTHER): Attending: General Surgery | Admitting: General Surgery

## 2024-06-03 DIAGNOSIS — L97513 Non-pressure chronic ulcer of other part of right foot with necrosis of muscle: Secondary | ICD-10-CM | POA: Insufficient documentation

## 2024-06-03 DIAGNOSIS — L97523 Non-pressure chronic ulcer of other part of left foot with necrosis of muscle: Secondary | ICD-10-CM | POA: Diagnosis not present

## 2024-06-03 DIAGNOSIS — E1152 Type 2 diabetes mellitus with diabetic peripheral angiopathy with gangrene: Secondary | ICD-10-CM

## 2024-06-03 DIAGNOSIS — I70235 Atherosclerosis of native arteries of right leg with ulceration of other part of foot: Secondary | ICD-10-CM

## 2024-06-03 DIAGNOSIS — E11621 Type 2 diabetes mellitus with foot ulcer: Secondary | ICD-10-CM | POA: Diagnosis present

## 2024-06-17 NOTE — Progress Notes (Signed)
 [name]                                          MRN: [id number]   06/17/2024   The [facility] Quality Team Specialist reviewed this patient medical record for the purposes of chart review for care gap closure. The following were reviewed: abstraction for care gap closure-glycemic status assessment.    [facility] Quality Team

## 2024-06-20 ENCOUNTER — Ambulatory Visit: Admitting: Podiatry

## 2024-06-20 DIAGNOSIS — I96 Gangrene, not elsewhere classified: Secondary | ICD-10-CM

## 2024-06-20 NOTE — Progress Notes (Signed)
"  °  Subjective:  Patient ID: [name] [name], male    DOB: [birth year],  MRN: [id number]  1 week right foot transmetatarsal amputation date of surgery 04/25/2024  had left foot TMA on 03/25/2024  Patient is following up for bilateral TMA's with ischemic changes.  He has been limited weightbearing in post op shoes bialteral.  He is pending being established with wound care center, per chart review has upcoming appointment  June 03, 2024.  Saw Dr. Lamount previously, concern for dehiscence on the left foot medial aspect TMA. Has been doing betadine dresisngs   Patient has been to the wound care clinic recently and he reports that they stated they were not able to do anything to help with his wounds on either foot.  He is aware he is facing amputation on the right side.  Hoping to avoid losing both feet.  Negative for chest pain and shortness of breath Constitutional signs: no Review of all other systems is negative Objective:   There were no vitals filed for this visit.  General AA&O x3. Normal mood and affect.  Vascular Bilateral feet are warm to the ankle  Neurologic Epicritic sensation grossly reduced.  Dermatologic Sutures and staples are intact to the right foot TMA site - Necrosis of the right foot TMA site encompasing the entire amputation site, much worse than prior though is dry and does not appear to be wet gangrene or gas producing infection at this time      On the left foot there is a full thickness dehiscence of the medial aspect with first metatarsal bone exposed.  The tissue at the dehisced sites on the left foot appears slightly more fibrotic and less necrotic at this visit        Orthopedic: MMT 5/5 in dorsiflexion, plantarflexion, inversion, and eversion. Normal joint ROM without pain or crepitus.   Radiographs: Left foot 3 views No obvious erosions at the distal first met amp site  Assessment & Plan:  Patient was evaluated and treated and all questions  answered.  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  I discussed this with him he is understanding  - In regards to the left foot there is a possibility of revision for salvage though he is at severe disadvantage due to his PAD on the left side.  I will attempt salvage during his admission which will be upcoming we will discuss timing with vascular surgery possibly sending him to the ED SUNDAY -Plan on the left side before MRI for the resection of any osseous infection noted on MRI with debridement of any necrotic tissue and likely grafting and wound VAC.  I did not guarantee anything in regards to the left side and still concerned he may end up with an proximal amputation on that side as well. - In regards to surgical timing I will discuss further with vascular surgery prior to decision on when to send him  - Betadine wet-to-dry dressings were applied bilaterally.  Betadine gauze lightly packed into the left foot dehiscence site.   Continue this at home   [name], DPM   "

## 2024-06-23 ENCOUNTER — Encounter (HOSPITAL_COMMUNITY): Payer: Self-pay

## 2024-06-23 ENCOUNTER — Emergency Department (HOSPITAL_COMMUNITY)

## 2024-06-23 ENCOUNTER — Other Ambulatory Visit: Payer: Self-pay

## 2024-06-23 ENCOUNTER — Inpatient Hospital Stay (HOSPITAL_COMMUNITY)
Admission: EM | Admit: 2024-06-23 | Discharge: 2024-07-09 | DRG: 239 | Disposition: A | Discharge status: Skilled Nursing Facility | Attending: Family Medicine | Admitting: Family Medicine

## 2024-06-23 DIAGNOSIS — Z8249 Family history of ischemic heart disease and other diseases of the circulatory system: Secondary | ICD-10-CM | POA: Diagnosis not present

## 2024-06-23 DIAGNOSIS — Z7982 Long term (current) use of aspirin: Secondary | ICD-10-CM

## 2024-06-23 DIAGNOSIS — Z681 Body mass index (BMI) 19 or less, adult: Secondary | ICD-10-CM

## 2024-06-23 DIAGNOSIS — Z604 Social exclusion and rejection: Secondary | ICD-10-CM | POA: Diagnosis present

## 2024-06-23 DIAGNOSIS — E1152 Type 2 diabetes mellitus with diabetic peripheral angiopathy with gangrene: Principal | ICD-10-CM | POA: Diagnosis present

## 2024-06-23 DIAGNOSIS — Z66 Do not resuscitate: Secondary | ICD-10-CM | POA: Diagnosis present

## 2024-06-23 DIAGNOSIS — L03115 Cellulitis of right lower limb: Secondary | ICD-10-CM | POA: Diagnosis present

## 2024-06-23 DIAGNOSIS — M869 Osteomyelitis, unspecified: Secondary | ICD-10-CM | POA: Diagnosis present

## 2024-06-23 DIAGNOSIS — E1122 Type 2 diabetes mellitus with diabetic chronic kidney disease: Secondary | ICD-10-CM | POA: Diagnosis present

## 2024-06-23 DIAGNOSIS — Z9841 Cataract extraction status, right eye: Secondary | ICD-10-CM

## 2024-06-23 DIAGNOSIS — K219 Gastro-esophageal reflux disease without esophagitis: Secondary | ICD-10-CM | POA: Diagnosis present

## 2024-06-23 DIAGNOSIS — E039 Hypothyroidism, unspecified: Secondary | ICD-10-CM | POA: Diagnosis present

## 2024-06-23 DIAGNOSIS — Z79890 Hormone replacement therapy (postmenopausal): Secondary | ICD-10-CM

## 2024-06-23 DIAGNOSIS — I96 Gangrene, not elsewhere classified: Secondary | ICD-10-CM | POA: Diagnosis present

## 2024-06-23 DIAGNOSIS — N2581 Secondary hyperparathyroidism of renal origin: Secondary | ICD-10-CM | POA: Diagnosis present

## 2024-06-23 DIAGNOSIS — D631 Anemia in chronic kidney disease: Secondary | ICD-10-CM | POA: Diagnosis present

## 2024-06-23 DIAGNOSIS — N186 End stage renal disease: Secondary | ICD-10-CM | POA: Diagnosis present

## 2024-06-23 DIAGNOSIS — T8789 Other complications of amputation stump: Secondary | ICD-10-CM

## 2024-06-23 DIAGNOSIS — I5042 Chronic combined systolic (congestive) and diastolic (congestive) heart failure: Secondary | ICD-10-CM | POA: Diagnosis present

## 2024-06-23 DIAGNOSIS — Z89422 Acquired absence of other left toe(s): Secondary | ICD-10-CM

## 2024-06-23 DIAGNOSIS — Z7902 Long term (current) use of antithrombotics/antiplatelets: Secondary | ICD-10-CM | POA: Diagnosis not present

## 2024-06-23 DIAGNOSIS — I132 Hypertensive heart and chronic kidney disease with heart failure and with stage 5 chronic kidney disease, or end stage renal disease: Secondary | ICD-10-CM | POA: Diagnosis not present

## 2024-06-23 DIAGNOSIS — M609 Myositis, unspecified: Secondary | ICD-10-CM | POA: Diagnosis present

## 2024-06-23 DIAGNOSIS — Z992 Dependence on renal dialysis: Secondary | ICD-10-CM | POA: Diagnosis not present

## 2024-06-23 DIAGNOSIS — E785 Hyperlipidemia, unspecified: Secondary | ICD-10-CM | POA: Diagnosis present

## 2024-06-23 DIAGNOSIS — I70223 Atherosclerosis of native arteries of extremities with rest pain, bilateral legs: Secondary | ICD-10-CM

## 2024-06-23 DIAGNOSIS — Z89421 Acquired absence of other right toe(s): Secondary | ICD-10-CM

## 2024-06-23 DIAGNOSIS — I5043 Acute on chronic combined systolic (congestive) and diastolic (congestive) heart failure: Secondary | ICD-10-CM | POA: Diagnosis not present

## 2024-06-23 DIAGNOSIS — Z833 Family history of diabetes mellitus: Secondary | ICD-10-CM | POA: Diagnosis not present

## 2024-06-23 DIAGNOSIS — Z79899 Other long term (current) drug therapy: Secondary | ICD-10-CM

## 2024-06-23 DIAGNOSIS — Z9842 Cataract extraction status, left eye: Secondary | ICD-10-CM

## 2024-06-23 DIAGNOSIS — E1151 Type 2 diabetes mellitus with diabetic peripheral angiopathy without gangrene: Secondary | ICD-10-CM | POA: Diagnosis not present

## 2024-06-23 DIAGNOSIS — E1169 Type 2 diabetes mellitus with other specified complication: Secondary | ICD-10-CM | POA: Diagnosis not present

## 2024-06-23 DIAGNOSIS — E1165 Type 2 diabetes mellitus with hyperglycemia: Secondary | ICD-10-CM | POA: Diagnosis present

## 2024-06-23 DIAGNOSIS — I120 Hypertensive chronic kidney disease with stage 5 chronic kidney disease or end stage renal disease: Secondary | ICD-10-CM | POA: Diagnosis not present

## 2024-06-23 LAB — BASIC METABOLIC PANEL WITH GFR
Anion gap: 14 (ref 5–15)
BUN: 25 mg/dL — ABNORMAL HIGH (ref 8–23)
CO2: 25 mmol/L (ref 22–32)
Calcium: 8.6 mg/dL — ABNORMAL LOW (ref 8.9–10.3)
Chloride: 99 mmol/L (ref 98–111)
Creatinine, Ser: 5.51 mg/dL — ABNORMAL HIGH (ref 0.61–1.24)
GFR, Estimated: 11 mL/min — ABNORMAL LOW
Glucose, Bld: 236 mg/dL — ABNORMAL HIGH (ref 70–99)
Potassium: 3.8 mmol/L (ref 3.5–5.1)
Sodium: 138 mmol/L (ref 135–145)

## 2024-06-23 LAB — SEDIMENTATION RATE: Sed Rate: 73 mm/h — ABNORMAL HIGH (ref 0–16)

## 2024-06-23 LAB — CBC WITH DIFFERENTIAL/PLATELET
Abs Immature Granulocytes: 0.09 K/uL — ABNORMAL HIGH (ref 0.00–0.07)
Basophils Absolute: 0.1 K/uL (ref 0.0–0.1)
Basophils Relative: 1 %
Eosinophils Absolute: 0.2 K/uL (ref 0.0–0.5)
Eosinophils Relative: 2 %
HCT: 36.2 % — ABNORMAL LOW (ref 39.0–52.0)
Hemoglobin: 10.6 g/dL — ABNORMAL LOW (ref 13.0–17.0)
Immature Granulocytes: 1 %
Lymphocytes Relative: 12 %
Lymphs Abs: 1.3 K/uL (ref 0.7–4.0)
MCH: 28.9 pg (ref 26.0–34.0)
MCHC: 29.3 g/dL — ABNORMAL LOW (ref 30.0–36.0)
MCV: 98.6 fL (ref 80.0–100.0)
Monocytes Absolute: 0.5 K/uL (ref 0.1–1.0)
Monocytes Relative: 5 %
Neutro Abs: 8.7 K/uL — ABNORMAL HIGH (ref 1.7–7.7)
Neutrophils Relative %: 79 %
Platelets: 247 K/uL (ref 150–400)
RBC: 3.67 MIL/uL — ABNORMAL LOW (ref 4.22–5.81)
RDW: 15.2 % (ref 11.5–15.5)
WBC: 10.9 K/uL — ABNORMAL HIGH (ref 4.0–10.5)
nRBC: 0 % (ref 0.0–0.2)

## 2024-06-23 LAB — PREALBUMIN: Prealbumin: 18 mg/dL (ref 18–38)

## 2024-06-23 LAB — CBG MONITORING, ED: Glucose-Capillary: 154 mg/dL — ABNORMAL HIGH (ref 70–99)

## 2024-06-23 LAB — C-REACTIVE PROTEIN: CRP: 9.9 mg/dL — ABNORMAL HIGH

## 2024-06-23 MED ORDER — OXYCODONE HCL 5 MG PO TABS
5.0000 mg | ORAL_TABLET | Freq: Once | ORAL | Status: AC
  Administered 2024-06-23: 5 mg via ORAL
  Filled 2024-06-23: qty 1

## 2024-06-23 MED ORDER — HEPARIN SODIUM (PORCINE) 5000 UNIT/ML IJ SOLN
5000.0000 [IU] | Freq: Three times a day (TID) | INTRAMUSCULAR | Status: DC
  Administered 2024-06-23 – 2024-06-30 (×7): 5000 [IU] via SUBCUTANEOUS
  Filled 2024-06-23 (×11): qty 1

## 2024-06-23 MED ORDER — METRONIDAZOLE 500 MG/100ML IV SOLN
500.0000 mg | Freq: Two times a day (BID) | INTRAVENOUS | Status: DC
  Administered 2024-06-23 – 2024-06-27 (×9): 500 mg via INTRAVENOUS
  Filled 2024-06-23 (×9): qty 100

## 2024-06-23 MED ORDER — HYDRALAZINE HCL 20 MG/ML IJ SOLN: 10.0000 mg | Freq: Four times a day (QID) | INTRAMUSCULAR | Status: DC | PRN

## 2024-06-23 MED ORDER — MORPHINE SULFATE (PF) 2 MG/ML IV SOLN
2.0000 mg | INTRAVENOUS | Status: DC | PRN
  Administered 2024-06-24 (×2): 2 mg via INTRAVENOUS
  Filled 2024-06-23 (×2): qty 1

## 2024-06-23 MED ORDER — ONDANSETRON HCL 4 MG PO TABS: 4.0000 mg | ORAL_TABLET | Freq: Four times a day (QID) | ORAL | Status: DC | PRN

## 2024-06-23 MED ORDER — CALCIUM ACETATE (PHOS BINDER) 667 MG PO CAPS
667.0000 mg | ORAL_CAPSULE | Freq: Three times a day (TID) | ORAL | Status: DC
  Administered 2024-06-23 – 2024-07-08 (×20): 667 mg via ORAL
  Filled 2024-06-23 (×47): qty 1

## 2024-06-23 MED ORDER — SODIUM CHLORIDE 0.9 % IV SOLN
1.0000 g | INTRAVENOUS | Status: DC
  Administered 2024-06-23 – 2024-06-27 (×5): 1 g via INTRAVENOUS
  Filled 2024-06-23 (×6): qty 10

## 2024-06-23 MED ORDER — ACETAMINOPHEN 325 MG PO TABS
650.0000 mg | ORAL_TABLET | Freq: Four times a day (QID) | ORAL | Status: DC | PRN
  Administered 2024-06-25: 650 mg via ORAL
  Filled 2024-06-23: qty 2

## 2024-06-23 MED ORDER — ONDANSETRON HCL 4 MG/2ML IJ SOLN
4.0000 mg | Freq: Four times a day (QID) | INTRAMUSCULAR | Status: DC | PRN
  Administered 2024-07-05: 4 mg via INTRAVENOUS

## 2024-06-23 MED ORDER — AMLODIPINE BESYLATE 10 MG PO TABS
10.0000 mg | ORAL_TABLET | Freq: Every morning | ORAL | Status: DC
  Administered 2024-06-24 – 2024-07-07 (×12): 10 mg via ORAL
  Filled 2024-06-23 (×13): qty 1
  Filled 2024-06-23: qty 2

## 2024-06-23 MED ORDER — OXYCODONE-ACETAMINOPHEN 5-325 MG PO TABS
1.0000 | ORAL_TABLET | Freq: Once | ORAL | Status: AC
  Administered 2024-06-23: 1 via ORAL
  Filled 2024-06-23: qty 1

## 2024-06-23 MED ORDER — VANCOMYCIN HCL 1500 MG/300ML IV SOLN
1500.0000 mg | Freq: Once | INTRAVENOUS | Status: AC
  Administered 2024-06-23: 1500 mg via INTRAVENOUS
  Filled 2024-06-23: qty 300

## 2024-06-23 MED ORDER — POLYETHYLENE GLYCOL 3350 17 G PO PACK
17.0000 g | PACK | Freq: Every day | ORAL | Status: DC | PRN
  Administered 2024-06-27: 17 g via ORAL
  Filled 2024-06-23: qty 1

## 2024-06-23 MED ORDER — AMITRIPTYLINE HCL 10 MG PO TABS
10.0000 mg | ORAL_TABLET | Freq: Every day | ORAL | Status: DC
  Administered 2024-06-23 – 2024-07-06 (×13): 10 mg via ORAL
  Filled 2024-06-23 (×17): qty 1

## 2024-06-23 MED ORDER — SODIUM CHLORIDE 0.9 % IV SOLN: INTRAVENOUS | Status: DC

## 2024-06-23 MED ORDER — VANCOMYCIN HCL 750 MG/150ML IV SOLN
750.0000 mg | INTRAVENOUS | Status: DC
  Administered 2024-06-24: 750 mg via INTRAVENOUS
  Filled 2024-06-23 (×3): qty 150

## 2024-06-23 MED ORDER — LEVOTHYROXINE SODIUM 50 MCG PO TABS
50.0000 ug | ORAL_TABLET | Freq: Every day | ORAL | Status: DC
  Administered 2024-06-24 – 2024-07-07 (×9): 50 ug via ORAL
  Filled 2024-06-23 (×10): qty 1

## 2024-06-23 MED ORDER — PANTOPRAZOLE SODIUM 40 MG PO TBEC
40.0000 mg | DELAYED_RELEASE_TABLET | Freq: Every day | ORAL | Status: DC
  Administered 2024-06-24 – 2024-07-07 (×13): 40 mg via ORAL
  Filled 2024-06-23 (×15): qty 1

## 2024-06-23 MED ORDER — GADOBUTROL 1 MMOL/ML IV SOLN
7.5000 mL | Freq: Once | INTRAVENOUS | Status: AC | PRN
  Administered 2024-06-23: 7.5 mL via INTRAVENOUS

## 2024-06-23 MED ORDER — OXYCODONE HCL 5 MG PO TABS
5.0000 mg | ORAL_TABLET | ORAL | Status: DC | PRN
  Administered 2024-06-24 – 2024-06-25 (×2): 5 mg via ORAL
  Filled 2024-06-23 (×2): qty 1

## 2024-06-23 MED ORDER — ACETAMINOPHEN 650 MG RE SUPP: 650.0000 mg | Freq: Four times a day (QID) | RECTAL | Status: DC | PRN

## 2024-06-23 MED ORDER — METOPROLOL TARTRATE 50 MG PO TABS
50.0000 mg | ORAL_TABLET | Freq: Two times a day (BID) | ORAL | Status: DC
  Administered 2024-06-23 – 2024-07-07 (×26): 50 mg via ORAL
  Filled 2024-06-23 (×12): qty 1
  Filled 2024-06-23: qty 2
  Filled 2024-06-23: qty 1
  Filled 2024-06-23: qty 2
  Filled 2024-06-23 (×15): qty 1

## 2024-06-23 MED ORDER — ATORVASTATIN CALCIUM 40 MG PO TABS
40.0000 mg | ORAL_TABLET | Freq: Every day | ORAL | Status: DC
  Administered 2024-06-24 – 2024-07-07 (×13): 40 mg via ORAL
  Filled 2024-06-23 (×15): qty 1

## 2024-06-23 MED ORDER — CHLORHEXIDINE GLUCONATE CLOTH 2 % EX PADS
6.0000 | MEDICATED_PAD | Freq: Every day | CUTANEOUS | Status: DC
  Administered 2024-06-25 – 2024-07-02 (×5): 6 via TOPICAL

## 2024-06-23 NOTE — Progress Notes (Signed)
 Pharmacy Antibiotic Note  [name] [name] is a 64 y.o. male admitted on 06/23/2024 with osteomyelitis.  Pharmacy has been consulted for vancomycin  dosing.  Plan: Vancomycin  1500 mg IV x 1, then 750 mg IV q HD Monitor HD schedule, Cx and clinical progression to narrow Vancomycin  random level as needed  Height: 5' 10 (177.8 cm) Weight: 74.8 kg (165 lb) IBW/kg (Calculated) : 73  Temp (24hrs), Avg:97.6 F (36.4 C), Min:97.5 F (36.4 C), Max:97.7 F (36.5 C)  Recent Labs  Lab 06/23/24 1340  WBC 10.9*  CREATININE 5.51*    Estimated Creatinine Clearance: 13.8 mL/min (A) (by C-G formula based on SCr of 5.51 mg/dL (H)).    Allergies[1]  Dorn Poot, PharmD, [facility] Clinical Pharmacist ED Pharmacist Phone # [phone] 06/23/2024 8:23 PM      [1] No Known Allergies

## 2024-06-23 NOTE — ED Triage Notes (Signed)
 Foot doctor sent pt over here for possible left and right foot amputation. Pt is diabetic. Pt lost all 10 of toes. Axox4.

## 2024-06-23 NOTE — Consult Note (Signed)
 " Hospital Consult    Reason for Consult: Bilateral lower extremity critical limb ischemia with tissue loss Requesting Physician: Emergency department MRN #:  [id number]  History of Present Illness: This is a 64 y.o. male known to the vascular surgery service having previously undergone balloon angioplasty of the posterior tibial artery.  Patient has known bilateral lower extremity critical limb ischemia with tissue loss and subsequent transmetatarsal amputations.  These were given a low likelihood of healing due to his severe small vessel disease from longstanding diabetes.  Patient presents at the request of his podiatrist Dr. Malvin due to nonhealing transmetatarsal amputations.  The right has more tissue loss than the left.  The right is no longer salvageable and vascular surgery was called for below-knee amputation.  The left is deemed to be salvageable, with plans to undergo revision in the coming days.  On exam, [name] [name] was resting comfortably, accompanied by his mom. No complaints.  Past Medical History:  Diagnosis Date   Anemia    Arthritis    Cardiomyopathy [facility])    July 2022   Diabetes mellitus [facility])    Dialysis patient    HD MWF   GERD (gastroesophageal reflux disease)    HTN (hypertension)    Hyperlipidemia    Hypothyroidism    Nephrotic syndrome    HD MWF   Obesity    Peripheral vascular disease    Pulmonary hypertension (HCC)    July 2022 in setting of nephrotic syndrome   Secondary hyperparathyroidism of renal origin     Past Surgical History:  Procedure Laterality Date   ABDOMINAL AORTOGRAM N/A 04/16/2024   Procedure: ABDOMINAL AORTOGRAM;  Surgeon: Serene Gaile ORN, MD;  Location: [facility] INVASIVE CV LAB;  Service: Cardiovascular;  Laterality: N/A;   ABDOMINAL AORTOGRAM W/LOWER EXTREMITY N/A 01/30/2024   Procedure: ABDOMINAL AORTOGRAM W/LOWER EXTREMITY;  Surgeon: Serene Gaile ORN, MD;  Location: [facility];  Service: Cardiovascular;  Laterality: N/A;    ABDOMINAL AORTOGRAM W/LOWER EXTREMITY N/A 03/20/2024   Procedure: ABDOMINAL AORTOGRAM W/LOWER EXTREMITY;  Surgeon: Lanis Fonda BRAVO, MD;  Location: [facility] INVASIVE CV LAB;  Service: Cardiovascular;  Laterality: N/A;   AMPUTATION Left 03/25/2024   Procedure: AMPUTATION, FOOT, RAY LEFT;  Surgeon: Malvin Marsa FALCON, DPM;  Location: [facility] OR;  Service: Orthopedics/Podiatry;  Laterality: Left;  Tranmetarsal amputation   AMPUTATION TOE Left 02/14/2024   Procedure: AMPUTATION, TOE LEFT SECOND AND THIRD TOES;  Surgeon: Gershon Donnice SAUNDERS, DPM;  Location: [facility] OR;  Service: Orthopedics/Podiatry;  Laterality: Left;   AV FISTULA PLACEMENT Left 12/27/2022   Procedure: INSERTION OF LEFT ARM ARTERIOVENOUS (AV) GORE-TEX GRAFT;  Surgeon: Eliza Lonni RAMAN, MD;  Location: [facility] OR;  Service: Vascular;  Laterality: Left;   AV FISTULA PLACEMENT Right 08/17/2023   Procedure: INSERTION OF ARTERIOVENOUS (AV) GORE-TEX GRAFT RIGHT ARM USING 4-16mm GORETEX STRETCH GRAFT;  Surgeon: Magda Debby SAILOR, MD;  Location: [facility] OR;  Service: Vascular;  Laterality: Right;   AVGG REMOVAL Left 03/15/2023   Procedure: REMOVAL OF LEFT UPPER ARM ARTERIOVENOUS GORETEX GRAFT (AVGG);  Surgeon: Magda Debby SAILOR, MD;  Location: [facility] OR;  Service: Vascular;  Laterality: Left;   BIOPSY  01/13/2023   Procedure: BIOPSY;  Surgeon: Cinderella Deatrice FALCON, MD;  Location: [facility];  Service: Endoscopy;;   CATARACT EXTRACTION, BILATERAL  2018   COLONOSCOPY WITH PROPOFOL  N/A 01/13/2023   Procedure: COLONOSCOPY WITH PROPOFOL ;  Surgeon: Cinderella Deatrice FALCON, MD;  Location: [facility];  Service: Endoscopy;  Laterality: N/A;   DIALYSIS/PERMA  CATHETER INSERTION Right 01/10/2024   Procedure: DIALYSIS/PERMA CATHETER INSERTION;  Surgeon: Gretta Lonni PARAS, MD;  Location: [facility] LAB;  Service: Cardiovascular;  Laterality: Right;   INSERTION OF DIALYSIS CATHETER Right 12/27/2022   Procedure: INSERTION OF Right Internal Jugular  DIALYSIS CATHETER;  Surgeon: Eliza Lonni RAMAN, MD;  Location: [facility] OR;  Service: Vascular;  Laterality: Right;   INSERTION OF DIALYSIS CATHETER N/A 03/15/2023   Procedure: INSERTION OF TUNNELED DIALYSIS CATHETER;  Surgeon: Magda Debby SAILOR, MD;  Location: [facility] OR;  Service: Vascular;  Laterality: N/A;   INSERTION OF DIALYSIS CATHETER Right 03/25/2023   Procedure: INSERTION OF RIGHT TUNNELED DIALYSIS CATHETER;  Surgeon: Pearline Norman RAMAN, MD;  Location: [facility] OR;  Service: Vascular;  Laterality: Right;   LOWER EXTREMITY ANGIOGRAPHY N/A 04/16/2024   Procedure: Lower Extremity Angiography;  Surgeon: Serene Gaile ORN, MD;  [facility];  Service: Cardiovascular;  Laterality: N/A;   LOWER EXTREMITY INTERVENTION  01/30/2024   Procedure: LOWER EXTREMITY INTERVENTION;  Surgeon: Serene Gaile ORN, MD;  [facility];  Service: Cardiovascular;;   LOWER EXTREMITY INTERVENTION N/A 04/16/2024   Procedure: LOWER EXTREMITY INTERVENTION;  Surgeon: Serene Gaile ORN, MD;  [facility];  Service: Cardiovascular;  Laterality: N/A;   PERIPHERAL INTRAVASCULAR LITHOTRIPSY  01/30/2024   Procedure: PERIPHERAL INTRAVASCULAR LITHOTRIPSY;  Surgeon: Serene Gaile ORN, MD;  [facility];  Service: Cardiovascular;;   PERIPHERAL VASCULAR ATHERECTOMY  01/30/2024   Procedure: PERIPHERAL VASCULAR ATHERECTOMY;  Surgeon: Serene Gaile ORN, MD;  [facility];  Service: Cardiovascular;;   PERIPHERAL VASCULAR THROMBECTOMY Right 12/20/2023   Procedure: PERIPHERAL VASCULAR THROMBECTOMY;  Surgeon: Pearline Norman RAMAN, MD;  Location: [facility];  Service: Cardiovascular;  Laterality: Right;   TEE WITHOUT CARDIOVERSION N/A 03/20/2023   Procedure: TRANSESOPHAGEAL ECHOCARDIOGRAM;  Surgeon: Barbaraann Darryle Debby, MD;  Location: [facility] OR;  Service: Cardiovascular;  Laterality: N/A;   TONSILLECTOMY     TRANSMETATARSAL AMPUTATION Right 04/26/2024   Procedure: RIGHT FOOT TRANSMETATARSAL AMPUTATION;  Surgeon: Malvin Marsa FALCON, DPM;  Location: [facility] OR;  Service: Orthopedics/Podiatry;  Laterality: Right;   VENOUS ANGIOPLASTY  12/20/2023   Procedure: VENOUS ANGIOPLASTY;  Surgeon: Pearline Norman RAMAN, MD;  Location: [facility];  Service: Cardiovascular;;   VENOUS STENT  12/20/2023   Procedure: VENOUS STENT;  Surgeon: Pearline Norman RAMAN, MD;  Location: [facility];  Service: Cardiovascular;;  outflow    Allergies[1]  Prior to Admission medications  Medication Sig Start Date End Date Taking? Authorizing Provider  Accu-Chek Softclix Lancets lancets  08/06/20   [provider]  acetaminophen  (TYLENOL ) 500 MG tablet Take 500-1,000 mg by mouth every 8 (eight) hours as needed for moderate pain (pain score 4-6).    [provider]  amitriptyline  (ELAVIL ) 10 MG tablet Take 10 mg by mouth at bedtime.    [provider]  amLODipine  (NORVASC ) 10 MG tablet Take 1 tablet (10 mg total) by mouth every morning. 03/29/23   Regalado, Belkys A, MD  aspirin  (ASPIRIN  CHILDRENS) 81 MG chewable tablet Chew 1 tablet (81 mg total) by mouth daily. Patient taking differently: Chew 81 mg by mouth every evening. 01/30/24 01/29/25  Serene Gaile ORN, MD  atorvastatin  (LIPITOR) 40 MG tablet Take 1 tablet (40 mg total) by mouth daily. 03/28/24   Gonfa, Taye T, MD  calcium  acetate (PHOSLO ) 667 MG capsule Take 667 mg by mouth 3 (three) times daily with meals. 07/07/23 06/27/24  [provider]  clopidogrel  (PLAVIX ) 75 MG tablet Take 1 tablet (75 mg total) by mouth daily. 01/30/24   Serene Gaile ORN, MD  Cyanocobalamin  (VITAMIN B12 PO) Take 1 tablet by mouth at bedtime.    [provider]  gabapentin  (NEURONTIN ) 100 MG capsule Take 100 mg by mouth at bedtime. 07/05/23   [provider]  levothyroxine  (SYNTHROID ) 50 MCG tablet Take 50 mcg by mouth daily before breakfast.    [provider]  metoprolol  tartrate (LOPRESSOR ) 50 MG tablet Take 1 tablet (50 mg total) by mouth 2 (two) times daily. 06/16/20    Comer Kirsch, PA-C  Multiple Vitamin (MULTIVITAMIN WITH MINERALS) TABS tablet Take 1 tablet by mouth daily. Men's Multivitamin for 50+    [provider]  oxyCODONE  (OXY IR/ROXICODONE ) 5 MG immediate release tablet Take 1 tablet (5 mg total) by mouth every 4 (four) hours as needed for moderate pain (pain score 4-6). 04/27/24   Cindy Garnette POUR, MD  pantoprazole  (PROTONIX ) 40 MG tablet TAKE 1 TABLET BY MOUTH ONCE DAILY 30  MINUTES  BEFORE  BREAKFAST 05/20/24   Carlan, Chelsea L, NP  Probiotic Product (PROBIOTIC DAILY PO) Take 1 capsule by mouth at bedtime.    [provider]  Senna (SENOKOT LAXATIVE GUMMIES) 8.7 MG CHEW Chew 1-4 each by mouth daily as needed (constipation.).    [provider]    Social History   Socioeconomic History   Marital status: Single    Spouse name: Not on file   Number of children: Not on file   Years of education: Not on file   Highest education level: Not on file  Occupational History   Not on file  Tobacco Use   Smoking status: Never   Smokeless tobacco: Never  Vaping Use   Vaping status: Never Used  Substance and Sexual Activity   Alcohol use: Never   Drug use: Never   Sexual activity: Not on file  Other Topics Concern   Not on file  Social History Narrative   Not on file   Social Drivers of Health   Tobacco Use: Low Risk (06/23/2024)   Patient History    Smoking Tobacco Use: Never    Smokeless Tobacco Use: Never    Passive Exposure: Not on file  Financial Resource Strain: Not on file  Food Insecurity: No Food Insecurity (04/25/2024)   Epic    Worried About Programme Researcher, Broadcasting/film/video in the Last Year: Never true    Ran Out of Food in the Last Year: Never true  Transportation Needs: No Transportation Needs (04/25/2024)   Epic    Lack of Transportation (Medical): No    Lack of Transportation (Non-Medical): No  Physical Activity: Not on file  Stress: Not on file  Social Connections: Socially Isolated (04/25/2024)    Social Connection and Isolation Panel    Frequency of Communication with Friends and Family: Once a week    Frequency of Social Gatherings with Friends and Family: Once a week    Attends Religious Services: Never    Database Administrator or Organizations: No    Attends Banker Meetings: Never    Marital Status: Married  Catering Manager Violence: Unknown (04/25/2024)   Epic    Fear of Current or Ex-Partner: Patient declined    Emotionally Abused: No    Physically Abused: No    Sexually Abused: No  Depression (PHQ2-9): Not on file  Alcohol Screen: Not on file  Housing: Low Risk (04/25/2024)  Epic    Unable to Pay for Housing in the Last Year: No    Number of Times Moved in the Last Year: 0    Homeless in the Last Year: No  Utilities: Not At Risk (04/25/2024)   Epic    Threatened with loss of utilities: No  Health Literacy: Not on file   Family History  Problem Relation Age of Onset   Hypertension Mother    Diabetes Mother    Cancer Father        prostate cancer   Hypertension Maternal Grandfather    Rheum arthritis Maternal Grandmother    CAD Neg Hx     ROS: Otherwise negative unless mentioned in HPI  Physical Examination  Vitals:   06/23/24 1500 06/23/24 1515  BP: (!) 147/71 (!) 158/79  Pulse: 63 64  Resp:    Temp:    SpO2: 100% 100%   Body mass index is 23.68 kg/m.  General:  WDWN in NAD Gait: Not observed HENT: WNL, normocephalic Pulmonary: normal non-labored breathing, without Rales, rhonchi,  wheezing Cardiac: regular Abdomen:  soft, NT/ND, no masses Skin: without rashes Vascular Exam/Pulses: 2+ popliteals, phasic posterior tibials Extremities: with ischemic changes, with Gangrene , without cellulitis; with open wounds;  Musculoskeletal: no muscle wasting or atrophy  Neurologic: A&O X 3;  No focal weakness or paresthesias are detected; speech is fluent/normal Psychiatric:  The pt has Normal affect. Lymph:  Unremarkable  CBC     Component Value Date/Time   WBC 10.9 (H) 06/23/2024 1340   RBC 3.67 (L) 06/23/2024 1340   HGB 10.6 (L) 06/23/2024 1340   HCT 36.2 (L) 06/23/2024 1340   PLT 247 06/23/2024 1340   MCV 98.6 06/23/2024 1340   MCH 28.9 06/23/2024 1340   MCHC 29.3 (L) 06/23/2024 1340   RDW 15.2 06/23/2024 1340   LYMPHSABS 1.3 06/23/2024 1340   MONOABS 0.5 06/23/2024 1340   EOSABS 0.2 06/23/2024 1340   BASOSABS 0.1 06/23/2024 1340    BMET    Component Value Date/Time   NA 138 06/23/2024 1340   K 3.8 06/23/2024 1340   CL 99 06/23/2024 1340   CO2 25 06/23/2024 1340   GLUCOSE 236 (H) 06/23/2024 1340   BUN 25 (H) 06/23/2024 1340   CREATININE 5.51 (H) 06/23/2024 1340   CALCIUM  8.6 (L) 06/23/2024 1340   GFRNONAA 11 (L) 06/23/2024 1340   GFRAA 48 (L) 01/31/2020 1216    COAGS: Lab Results  Component Value Date   INR 1.2 05/13/2024   INR 1.1 05/19/2023   INR 1.1 03/06/2023    ASSESSMENT/PLAN: This is a 64 y.o. male with bilateral lower extremity critical limb ischemia with tissue loss.  He has undergone revascularization, but has severe small vessel disease from longstanding diabetes.  Plan for left-sided TMA revision Monday versus Tuesday.  Will plan on right sided below-knee amputation Wednesday.  I had a long discussion with [name] [name] and his mother regarding the above.  They understand, and after discussing the risks and benefits, elected to proceed.    Fonda FORBES Rim MD MS [facility] [phone] 06/23/2024  3:55 PM     [1] No Known Allergies  "

## 2024-06-23 NOTE — H&P (Addendum)
 " History and Physical    Patient: [name] [name] FMW:[id number] DOB: 11-Nov-1960 DOA: 06/23/2024 DOS: the patient was seen and examined on 06/23/2024 . PCP: Kip Righter, MD  Patient coming from: Podiatry office. Chief complaint: Gangrene.  HPI:  [name] [name] is a 64 year old male with past medical history significant for poorly controlled type 2 diabetes mellitus, end-stage renal disease on hemodialysis (M/W/F schedule), severe peripheral arterial disease, hyperlipidemia, hypothyroidism, and cardiomyopathy who presents from podiatry clinic with bilateral foot gangrene and nonhealing wounds.  The patient underwent left foot ray amputation on 03/25/2024 and right transmetatarsal amputation on 04/26/2024. Despite these interventions and multiple prior revascularization procedures (most recently 04/16/2024), he now presents with dry gangrene/necrotic eschar at the right TMA site and nonhealing wounds at the left TMA site. The patient reports no purulence or crepitance. He denies fever, chills, or systemic symptoms of infection.  His vascular history is extensive, including multiple lower extremity angiograms with interventions (01/30/2024, 03/20/2024, 04/16/2024) including peripheral atherectomy, intravascular lithotripsy, and angioplasty. He has bilateral AV grafts for dialysis access (right arm 08/17/2023, left arm 12/27/2022) and currently has a right internal jugular dialysis catheter.  Per chart review pt has had le sx Right transmetatarsal amputation (04/26/2024) Left foot ray amputation (03/25/2024) Left second and third toe amputations (02/14/2024)  ED Course:  Vital signs in the ED were notable for the following:  Vitals:   06/23/24 1500 06/23/24 1515 06/23/24 1747 06/23/24 1830  BP: (!) 147/71 (!) 158/79  (!) 151/77  Pulse: 63 64  64  Temp:   (!) 97.5 F (36.4 C)   Resp:    (!) 21  Height:      Weight:      SpO2: 100% 100%  100%  TempSrc:   Oral   BMI (Calculated):      >>ED evaluation  thus far shows: - BMP showing glucose of 236 BUN of 25 creatinine 5.51 eGFR 11 calcium  9.6 potassium 3.8. -CBC showing white count of 10.9 hemoglobin of 10.6 platelets of 247. -Chest x-ray stable enlargement of cardiac silhouette mild chronic pulmonary vascular congestion no edema. -MRI of right/left foot ordered and pending.  >>While in the ED patient received the following: Medications  oxyCODONE  (Oxy IR/ROXICODONE ) immediate release tablet 5 mg (has no administration in time range)  oxyCODONE -acetaminophen  (PERCOCET/ROXICET) 5-325 MG per tablet 1 tablet (1 tablet Oral Given 06/23/24 1344)   Review of Systems  Musculoskeletal:        BL foot pain.    Past Medical History:  Diagnosis Date   Anemia    Arthritis    Cardiomyopathy [facility])    July 2022   Diabetes mellitus [facility])    Dialysis patient    HD MWF   GERD (gastroesophageal reflux disease)    HTN (hypertension)    Hyperlipidemia    Hypothyroidism    Nephrotic syndrome    HD MWF   Obesity    Peripheral vascular disease    Pulmonary hypertension (HCC)    July 2022 in setting of nephrotic syndrome   Secondary hyperparathyroidism of renal origin    Past Surgical History:  Procedure Laterality Date   ABDOMINAL AORTOGRAM N/A 04/16/2024   Procedure: ABDOMINAL AORTOGRAM;  Surgeon: Serene Gaile ORN, MD;  [facility]: [facility] IN[facility];  Service: Cardiovascular;  Laterality: N/A;   ABDOMINAL AORTOGRAM W/LOWER EXTREMITY N/A 01/30/2024   Procedure: ABDOMINAL AORTOGRAM W/LOWER EXTREMITY;  Surgeon: Serene Gaile ORN, MD;  Location: [facility];  Service: Cardiovascular;  Laterality: N/A;  ABDOMINAL AORTOGRAM W/LOWER EXTREMITY N/A 03/20/2024   Procedure: ABDOMINAL AORTOGRAM W/LOWER EXTREMITY;  Surgeon: Lanis Fonda BRAVO, MD;  Location: [facility] INVASIVE CV LAB;  Service: Cardiovascular;  Laterality: N/A;   AMPUTATION Left 03/25/2024   Procedure: AMPUTATION, FOOT, RAY LEFT;  Surgeon: Malvin Marsa FALCON, DPM;  Location: [facility] OR;  Service:  Orthopedics/Podiatry;  Laterality: Left;  Tranmetarsal amputation   AMPUTATION TOE Left 02/14/2024   Procedure: AMPUTATION, TOE LEFT SECOND AND THIRD TOES;  Surgeon: Gershon Donnice SAUNDERS, DPM;  Location: [facility] OR;  Service: Orthopedics/Podiatry;  Laterality: Left;   AV FISTULA PLACEMENT Left 12/27/2022   Procedure: INSERTION OF LEFT ARM ARTERIOVENOUS (AV) GORE-TEX GRAFT;  Surgeon: Eliza Lonni RAMAN, MD;  Location: [facility] OR;  Service: Vascular;  Laterality: Left;   AV FISTULA PLACEMENT Right 08/17/2023   Procedure: INSERTION OF ARTERIOVENOUS (AV) GORE-TEX GRAFT RIGHT ARM USING 4-95mm GORETEX STRETCH GRAFT;  Surgeon: Magda Debby SAILOR, MD;  Location: [facility] OR;  Service: Vascular;  Laterality: Right;   AVGG REMOVAL Left 03/15/2023   Procedure: REMOVAL OF LEFT UPPER ARM ARTERIOVENOUS GORETEX GRAFT (AVGG);  Surgeon: Magda Debby SAILOR, MD;  Location: [facility] OR;  Service: Vascular;  Laterality: Left;   BIOPSY  01/13/2023   Procedure: BIOPSY;  Surgeon: Cinderella Deatrice FALCON, MD;  Location: [facility];  Service: Endoscopy;;   CATARACT EXTRACTION, BILATERAL  2018   COLONOSCOPY WITH PROPOFOL  N/A 01/13/2023   Procedure: COLONOSCOPY WITH PROPOFOL ;  Surgeon: Cinderella Deatrice FALCON, MD;  Location: [facility];  Service: Endoscopy;  Laterality: N/A;   DIALYSIS/PERMA CATHETER INSERTION Right 01/10/2024   Procedure: DIALYSIS/PERMA CATHETER INSERTION;  Surgeon: Gretta Lonni PARAS, MD;  Location: [facility];  Service: Cardiovascular;  Laterality: Right;   INSERTION OF DIALYSIS CATHETER Right 12/27/2022   Procedure: INSERTION OF Right Internal Jugular  DIALYSIS CATHETER;  Surgeon: Eliza Lonni RAMAN, MD;  Location: [facility] OR;  Service: Vascular;  Laterality: Right;   INSERTION OF DIALYSIS CATHETER N/A 03/15/2023   Procedure: INSERTION OF TUNNELED DIALYSIS CATHETER;  Surgeon: Magda Debby SAILOR, MD;  [facility];  Service: Vascular;  Laterality: N/A;   INSERTION OF DIALYSIS CATHETER Right 03/25/2023   Procedure: INSERTION OF RIGHT  TUNNELED DIALYSIS CATHETER;  Surgeon: Pearline Norman RAMAN, MD;  Location: [facility] OR;  Service: Vascular;  Laterality: Right;   LOWER EXTREMITY ANGIOGRAPHY N/A 04/16/2024   Procedure: Lower Extremity Angiography;  Surgeon: Serene Gaile ORN, MD;  [facility];  Service: Cardiovascular;  Laterality: N/A;   LOWER EXTREMITY INTERVENTION  01/30/2024   Procedure: LOWER EXTREMITY INTERVENTION;  Surgeon: Serene Gaile ORN, MD;  [facility];  Service: Cardiovascular;;   LOWER EXTREMITY INTERVENTION N/A 04/16/2024   Procedure: LOWER EXTREMITY INTERVENTION;  Surgeon: Serene Gaile ORN, MD;  [facility];  Service: Cardiovascular;  Laterality: N/A;   PERIPHERAL INTRAVASCULAR LITHOTRIPSY  01/30/2024   Procedure: PERIPHERAL INTRAVASCULAR LITHOTRIPSY;  Surgeon: Serene Gaile ORN, MD;  [facility];  Service: Cardiovascular;;   PERIPHERAL VASCULAR ATHERECTOMY  01/30/2024   Procedure: PERIPHERAL VASCULAR ATHERECTOMY;  Surgeon: Serene Gaile ORN, MD;  [facility];  Service: Cardiovascular;;   PERIPHERAL VASCULAR THROMBECTOMY Right 12/20/2023   Procedure: PERIPHERAL VASCULAR THROMBECTOMY;  Surgeon: Pearline Norman RAMAN, MD;  Location: [facility];  Service: Cardiovascular;  Laterality: Right;   TEE WITHOUT CARDIOVERSION N/A 03/20/2023   Procedure: TRANSESOPHAGEAL ECHOCARDIOGRAM;  Surgeon: Barbaraann Darryle Debby, MD;  Location: [facility] OR;  Service: Cardiovascular;  Laterality: N/A;   TONSILLECTOMY  TRANSMETATARSAL AMPUTATION Right 04/26/2024   Procedure: RIGHT FOOT TRANSMETATARSAL AMPUTATION;  Surgeon: Malvin Marsa FALCON, DPM;  [facility];  Service: Orthopedics/Podiatry;  Laterality: Right;   VENOUS ANGIOPLASTY  12/20/2023   Procedure: VENOUS ANGIOPLASTY;  Surgeon: Pearline Norman RAMAN, MD;  Location: [facility];  Service: Cardiovascular;;   VENOUS STENT  12/20/2023   Procedure: VENOUS STENT;  Surgeon: Pearline Norman RAMAN, MD;  Location: [facility];  Service:  Cardiovascular;;  outflow    reports that he has never smoked. He has never used smokeless tobacco. He reports that he does not drink alcohol and does not use drugs. Allergies[1] Family History  Problem Relation Age of Onset   Hypertension Mother    Diabetes Mother    Cancer Father        prostate cancer   Hypertension Maternal Grandfather    Rheum arthritis Maternal Grandmother    CAD Neg Hx    Prior to Admission medications  Medication Sig Start Date End Date Taking? Authorizing Provider  Accu-Chek Softclix Lancets lancets  08/06/20   [provider]  acetaminophen  (TYLENOL ) 500 MG tablet Take 500-1,000 mg by mouth every 8 (eight) hours as needed for moderate pain (pain score 4-6).    [provider]  amitriptyline  (ELAVIL ) 10 MG tablet Take 10 mg by mouth at bedtime.    [provider]  amLODipine  (NORVASC ) 10 MG tablet Take 1 tablet (10 mg total) by mouth every morning. 03/29/23   Regalado, Belkys A, MD  aspirin  (ASPIRIN  CHILDRENS) 81 MG chewable tablet Chew 1 tablet (81 mg total) by mouth daily. Patient taking differently: Chew 81 mg by mouth every evening. 01/30/24 01/29/25  Serene Gaile ORN, MD  atorvastatin  (LIPITOR) 40 MG tablet Take 1 tablet (40 mg total) by mouth daily. 03/28/24   Gonfa, Taye T, MD  calcium  acetate (PHOSLO ) 667 MG capsule Take 667 mg by mouth 3 (three) times daily with meals. 07/07/23 06/27/24  [provider]  clopidogrel  (PLAVIX ) 75 MG tablet Take 1 tablet (75 mg total) by mouth daily. 01/30/24   Serene Gaile ORN, MD  Cyanocobalamin  (VITAMIN B12 PO) Take 1 tablet by mouth at bedtime.    [provider]  gabapentin  (NEURONTIN ) 100 MG capsule Take 100 mg by mouth at bedtime. 07/05/23   [provider]  levothyroxine  (SYNTHROID ) 50 MCG tablet Take 50 mcg by mouth daily before breakfast.    [provider]  metoprolol  tartrate (LOPRESSOR ) 50 MG tablet Take 1 tablet (50 mg total) by mouth 2 (two) times daily.  06/16/20   Comer Kirsch, PA-C  Multiple Vitamin (MULTIVITAMIN WITH MINERALS) TABS tablet Take 1 tablet by mouth daily. Men's Multivitamin for 50+    [provider]  oxyCODONE  (OXY IR/ROXICODONE ) 5 MG immediate release tablet Take 1 tablet (5 mg total) by mouth every 4 (four) hours as needed for moderate pain (pain score 4-6). 04/27/24   Cindy Garnette POUR, MD  pantoprazole  (PROTONIX ) 40 MG tablet TAKE 1 TABLET BY MOUTH ONCE DAILY 30  MINUTES  BEFORE  BREAKFAST 05/20/24   Carlan, Chelsea L, NP  Probiotic Product (PROBIOTIC DAILY PO) Take 1 capsule by mouth at bedtime.    [provider]  Senna (SENOKOT LAXATIVE GUMMIES) 8.7 MG CHEW Chew 1-4 each by mouth daily as needed (constipation.).    [provider]  Vitals:   06/23/24 1500 06/23/24 1515 06/23/24 1747 06/23/24 1830  BP: (!) 147/71 (!) 158/79  (!) 151/77  Pulse: 63 64  64  Resp:    (!) 21  Temp:   (!) 97.5 F (36.4 C)   TempSrc:   Oral   SpO2: 100% 100%  100%  Weight:      Height:       Physical Exam Vitals reviewed.  Constitutional:      General: He is not in acute distress.    Appearance: He is not ill-appearing.  HENT:     Head: Normocephalic.  Eyes:     Extraocular Movements: Extraocular movements intact.  Cardiovascular:     Rate and Rhythm: Normal rate and regular rhythm.     Pulses:          Dorsalis pedis pulses are 0 on the right side and 0 on the left side.       Posterior tibial pulses are detected w/ Doppler on the right side and detected w/ Doppler on the left side.     Heart sounds: Normal heart sounds.     Comments: Pt noted to have 2+ popliteals bl and post tibials per vascular md note.  Pulmonary:     Breath sounds: Normal breath sounds.  Abdominal:     General: There is no distension.     Palpations: Abdomen is soft.     Tenderness: There is no abdominal tenderness.  Musculoskeletal:     Comments:  Foul smelling gangrenous right foot. NP pulse.   Neurological:     General: No focal deficit present.     Mental Status: He is alert and oriented to person, place, and time.  Psychiatric:        Attention and Perception: Attention normal.        Speech: Speech normal.        Behavior: Behavior normal.        Labs on Admission: I have personally reviewed following labs and imaging studies CBC: Recent Labs  Lab 06/23/24 1340  WBC 10.9*  NEUTROABS 8.7*  HGB 10.6*  HCT 36.2*  MCV 98.6  PLT 247   Basic Metabolic Panel: Recent Labs  Lab 06/23/24 1340  NA 138  K 3.8  CL 99  CO2 25  GLUCOSE 236*  BUN 25*  CREATININE 5.51*  CALCIUM  8.6*   GFR: Estimated Creatinine Clearance: 13.8 mL/min (A) (by C-G formula based on SCr of 5.51 mg/dL (H)). Liver Function Tests: No results for input(s): AST, ALT, ALKPHOS, BILITOT, PROT, ALBUMIN  in the last 168 hours. No results for input(s): LIPASE, AMYLASE in the last 168 hours. No results for input(s): AMMONIA in the last 168 hours. Recent Labs    03/24/24 0812 03/25/24 0411 03/26/24 0644 03/27/24 0307 03/28/24 0316 04/16/24 1041 04/25/24 1655 04/26/24 0350 05/13/24 1600 06/23/24 1340  BUN 16 21 12 21  26* 17 10 14  33* 25*  CREATININE 5.13* 6.68* 4.05* 5.44* 7.14* 5.10* 4.46* 5.23* 7.53* 5.51*    Urine analysis:    Component Value Date/Time   COLORURINE AMBER (A) 03/06/2023 1036   APPEARANCEUR CLOUDY (A) 03/06/2023 1036   LABSPEC 1.019 03/06/2023 1036   PHURINE 5.0 03/06/2023 1036   GLUCOSEU NEGATIVE 03/06/2023 1036   HGBUR LARGE (A) 03/06/2023 1036   BILIRUBINUR NEGATIVE 03/06/2023 1036   KETONESUR NEGATIVE 03/06/2023 1036   PROTEINUR >=300 (A) 03/06/2023 1036   NITRITE NEGATIVE 03/06/2023 1036   LEUKOCYTESUR MODERATE (A) 03/06/2023 1036   Radiological Exams on  Admission: MR FOOT LEFT W WO CONTRAST Result Date: 06/23/2024 CLINICAL DATA:  Status post mid metatarsal amputation with wound dehiscence  and suspect gangrene. EXAM: MRI OF THE LEFT FOREFOOT WITHOUT AND WITH CONTRAST TECHNIQUE: Multiplanar, multisequence MR imaging of the left foot was performed both before and after administration of intravenous contrast. CONTRAST:  7.5mL GADAVIST  GADOBUTROL  1 MMOL/ML IV SOLN COMPARISON:  Radiographs 05/30/2024 FINDINGS: Open wounds noted along the mid metatarsal surgical site most notably medially overlying the residual first metatarsal. Diffuse nonenhancing soft tissue noted consistent with tissue necrosis/nonviable tissue. No rim enhancing abscess. Abnormal T1 and T2 signal intensity and subsequent contrast enhancement in the metatarsals consistent with osteomyelitis. No midfoot or hindfoot septic arthritis or osteomyelitis. Diffuse myofasciitis without definite findings for pyomyositis. IMPRESSION: 1. Open wounds along the mid metatarsal surgical site most notably medially overlying the residual first metatarsal. Diffuse nonenhancing soft tissue noted consistent with tissue necrosis/nonviable tissue. No rim enhancing abscess. 2. Abnormal T1 and T2 signal intensity and subsequent contrast enhancement in the residual metatarsals consistent with osteomyelitis. 3. Diffuse myofasciitis without definite findings for pyomyositis. Electronically Signed   By: MYRTIS Stammer M.D.   On: 06/23/2024 18:15   MR FOOT RIGHT W WO CONTRAST Result Date: 06/23/2024 CLINICAL DATA:  Recent transmetatarsal amputation of the right foot (04/26/2024). Pain, swelling and wound dehiscence with suspected gangrene. EXAM: MRI OF THE RIGHT FOREFOOT WITHOUT AND WITH CONTRAST TECHNIQUE: Multiplanar, multisequence MR imaging of the right foot was performed before and after the administration of intravenous contrast. CONTRAST:  7.5mL GADAVIST  GADOBUTROL  1 MMOL/ML IV SOLN COMPARISON:  Radiographs 04/26/2024 FINDINGS: Significant artifact noted at the amputation site possibly related to gas or skin staples. Abnormal T1 and T2 signal intensity in  the remaining metatarsal shafts highly suspicious bone infarcts and osteomyelitis. No findings suspicious for septic arthritis at the tarsal metatarsal joints. No obvious discrete drainable fluid collection in the subcutaneous tissues. There is diffuse cellulitis and myofasciitis. The hindfoot bony structures are intact. No evidence of tibiotalar or subtalar septic arthritis. Edema like signal changes in the foot musculature likely a combination loss of tension/function from nonfunctioning tendons and myofasciitis. IMPRESSION: 1. Significant artifact noted at the amputation site possibly related to gas or skin staples. 2. Abnormal T1 and T2 signal intensity in the remaining metatarsal shafts highly suspicious for bone infarcts and osteomyelitis. 3. No findings suspicious for septic arthritis at the tarsometatarsal joints. 4. Diffuse cellulitis and myofasciitis. Electronically Signed   By: MYRTIS Stammer M.D.   On: 06/23/2024 17:51   DG Chest Portable 1 View Result Date: 06/23/2024 CLINICAL DATA:  Preoperative evaluation EXAM: PORTABLE CHEST 1 VIEW COMPARISON:  05/13/2024 FINDINGS: Single frontal view of the chest demonstrates right internal jugular dialysis catheter. Cardiac silhouette is enlarged but stable. Mild chronic vascular congestion without airspace disease, effusion, or pneumothorax. No acute bony abnormality. IMPRESSION: 1. Stable enlargement of the cardiac silhouette. 2. Mild chronic pulmonary vascular congestion.  No overt edema. Electronically Signed   By: Ozell Daring M.D.   On: 06/23/2024 15:52   Data Reviewed: Relevant notes from primary care and specialist visits, past discharge summaries as available in EHR, including Care Everywhere . Prior diagnostic testing as pertinent to current admission diagnoses, Updated medications and problem lists for reconciliation .ED course, including vitals, labs, imaging, treatment and response to treatment,Triage notes, nursing and pharmacy notes and ED  provider's notes.Notable results as noted in HPI.Discussed case with EDMD/ ED APP/ or Specialty MD on call and as needed.  Assessment &  Plan  >> Dry gangrene/necrotic eschar TMA site: Patient was status post right TMA on November 2025 currently has ischemic rise gangrene with no purulence crepitance likely progression of his limb ischemia in the setting of his underlying severe PAD.  Stat MRI of both feet.  Vascular consult for evaluation and intervention.  Pain control blood cultures and patient started on antibiotics. MRI of foot has resulted The patient has gangrenous wounds with tissue necrosis, The presence of osteomyelitis and we will start broad spectrum iv abx for mrsa coverage.  >> Left foot nonhealing post TMA: Status post left TMA in October 2025, chronic wound with poor healing secondary to underlying PAD. Currently nonweightbearing.  >> Severe PAD/ Limb Ischemia: On exam patient has no palpable pulses and has no dopplerable pulses.  Appreciate vascular consult and management. Continue aspirin  81 mg PO daily Hold clopidogrel  75 mg PO daily Continue atorvastatin  40 mg PO daily   >> End-stage renal disease on hemodialysis: Nephrology consultation for dialysis coordination Schedule hemodialysis per nephrology recommendations Continue calcium  acetate 667 mg PO TID with meals Monitor electrolytes, volume status Renally dose all medications Avoid nephrotoxic agents   >> Diabetes mellitus type 2: HOLD PTA meds and manage pt on glycemic protocol with poct q4 hourly until post procedure.    >> Chronic combined systolic and diastolic CHF: Chronic Systolic and Diastolic Heart Failure Stable, euvolemic on examination Cardiomegaly on chest X-ray Continue metoprolol  tartrate 50 mg PO BID Volume management per hemodialysis Daily weights Monitor for signs of volume overload Continue amlodipine  10 mg PO daily   >> Hypothyroidism: Cont levothyroxine  at 50 mcg.    >>  Anemia:    Latest Ref Rng & Units 06/23/2024    1:40 PM 05/13/2024    4:00 PM 04/26/2024    3:50 AM  CBC  WBC 4.0 - 10.5 K/uL 10.9  7.3  7.1   Hemoglobin 13.0 - 17.0 g/dL 89.3  89.7  9.6   Hematocrit 39.0 - 52.0 % 36.2  34.4  31.2   Platelets 150 - 400 K/uL 247  167  149   Stable and follow type/ screen/ transfuse as needed.    >> Essential hypertension: Vitals:   06/23/24 1304 06/23/24 1500 06/23/24 1515 06/23/24 1830  BP: (!) 146/66 (!) 147/71 (!) 158/79 (!) 151/77  Currently elevated (158/79) Continue home antihypertensives: metoprolol  50 mg PO BID, amlodipine  10 mg PO daily    >>GERD Continue pantoprazole  40 mg PO daily   DVT prophylaxis:  Heparin  . Consults:  Vascular.  Podiatry. Nephrology.   Advance Care Planning:    Code Status: Full Code   Family Communication:  None . Disposition Plan:  TBD. Severity of Illness: The appropriate patient status for this patient is INPATIENT. Inpatient status is judged to be reasonable and necessary in order to provide the required intensity of service to ensure the patient's safety. The patient's presenting symptoms, physical exam findings, and initial radiographic and laboratory data in the context of their chronic comorbidities is felt to place them at high risk for further clinical deterioration. Furthermore, it is not anticipated that the patient will be medically stable for discharge from the hospital within 2 midnights of admission.   * I certify that at the point of admission it is my clinical judgment that the patient will require inpatient hospital care spanning beyond 2 midnights from the point of admission due to high intensity of service, high risk for further deterioration and high frequency of surveillance required.*  Unresulted Labs (  From admission, onward)     Start     Ordered   06/24/24 0500  CBC with Differential/Platelet  Tomorrow morning,   R        06/23/24 2007   06/24/24 0500  Comprehensive metabolic  panel with GFR  Tomorrow morning,   R        06/23/24 2007   06/24/24 0500  Magnesium  Tomorrow morning,   R        06/23/24 2007   06/24/24 0500  Phosphorus  Tomorrow morning,   R        06/23/24 2007   06/23/24 1648  Blood culture (routine x 2)  BLOOD CULTURE X 2,   R      06/23/24 1647            Meds ordered this encounter  Medications   oxyCODONE -acetaminophen  (PERCOCET/ROXICET) 5-325 MG per tablet 1 tablet    Refill:  0   oxyCODONE  (Oxy IR/ROXICODONE ) immediate release tablet 5 mg    Refill:  0   amitriptyline  (ELAVIL ) tablet 10 mg   amLODipine  (NORVASC ) tablet 10 mg   atorvastatin  (LIPITOR) tablet 40 mg   calcium  acetate (PHOSLO ) capsule 667 mg   levothyroxine  (SYNTHROID ) tablet 50 mcg   metoprolol  tartrate (LOPRESSOR ) tablet 50 mg   oxyCODONE  (Oxy IR/ROXICODONE ) immediate release tablet 5 mg    Refill:  0   pantoprazole  (PROTONIX ) EC tablet 40 mg   0.9 %  sodium chloride  infusion   OR Linked Order Group    acetaminophen  (TYLENOL ) tablet 650 mg    acetaminophen  (TYLENOL ) suppository 650 mg   morphine  (PF) 2 MG/ML injection 2 mg   polyethylene glycol (MIRALAX  / GLYCOLAX ) packet 17 g   OR Linked Order Group    ondansetron  (ZOFRAN ) tablet 4 mg    ondansetron  (ZOFRAN ) injection 4 mg   hydrALAZINE  (APRESOLINE ) injection 10 mg   heparin  injection 5,000 Units   gadobutrol  (GADAVIST ) 1 MMOL/ML injection 7.5 mL     Orders Placed This Encounter  Procedures   Blood culture (routine x 2)   DG Chest Portable 1 View   MR FOOT LEFT W WO CONTRAST   MR FOOT RIGHT W WO CONTRAST   Basic metabolic panel   CBC with Differential   C-reactive protein   Sedimentation rate   Prealbumin   CBC with Differential/Platelet   Comprehensive metabolic panel with GFR   Magnesium   Phosphorus   Diet heart healthy/carb modified Room service appropriate? Yes; Fluid consistency: Thin   Apply Diabetes Mellitus Care Plan   STAT CBG when hypoglycemia is suspected. If treated, recheck every  15 minutes after each treatment until CBG >/= 70 mg/dl   Refer to Hypoglycemia Protocol Sidebar Report for treatment of CBG < 70 mg/dl   Nurse to provide smoking / tobacco cessation education   Vital signs   Notify physician (specify)   Mobility Protocol: No Restrictions   Refer to Sidebar Report Mobility Protocol for Adult Inpatient   Initiate Adult Central Line Maintenance and Catheter Clearance Protocol for patients with central line (CVC, PICC, Port, Hemodialysis, Trialysis)   If patient diabetic or glucose greater than 140 notify physician for Sliding Scale Insulin  Orders   Intake and Output   Neurovascular checks   Initiate CHG Protocol for patients in ICU/SD or any patient with a central line or foley catheter   Do not place and if present remove PureWick   Initiate Oral Care Protocol   Initiate Carrier Fluid Protocol  RN may order General Admission PRN Orders utilizing General Admission PRN medications (through manage orders) for the following patient needs: allergy symptoms (Claritin ), cold sores (Carmex), cough (Robitussin DM), eye irritation (Liquifilm Tears), hemorrhoids (Tucks), indigestion (Maalox), minor skin irritation (Hydrocortisone Cream), muscle pain Lucienne Gay), nose irritation (saline nasal spray) and sore throat (Chloraseptic spray).   Cardiac Monitoring - Continuous Indefinite   Ambulate with assistance   Full code   Consult to podiatry Consult Timeframe: STAT - requires a response within one hour; STAT timeframe requires provider to provider communication, has the provider to provider communication been completed: Yes; Reason for Consult? need for BL foot amp...   Consult to vascular surgery   Consult for Unassigned Medical Admission   Consult to Registered Dietitian   Consult to Transition of Care Team   Consult to Peripheral Vascular Navigator   Pulse oximetry check with vital signs   Oxygen therapy Mode or (Route): Nasal cannula; Liters Per Minute: 2; Keep O2  saturation between: greater than 92 %   CBG monitoring, ED   ED EKG   EKG 12-Lead   Admit to Inpatient (patient's expected length of stay will be greater than 2 midnights or inpatient only procedure)   Aspiration precautions   Fall precautions    Author: Mario LULLA Blanch, MD 12 pm- 8 pm. [facility]. 06/23/2024 8:17 PM Please note for any communication after hours contact [facility] Assigned provider on call on Amion.       [1] No Known Allergies  "

## 2024-06-23 NOTE — ED Provider Notes (Signed)
 " [name] Provider Note   HPI/ROS    History obtained from patient.  [name] [name] is a 64 y.o. male who presents for No chief complaint on file. and who  has a past medical history of Anemia, Arthritis, Cardiomyopathy (HCC), Diabetes mellitus (HCC), Dialysis patient, GERD (gastroesophageal reflux disease), HTN (hypertension), Hyperlipidemia, Hypothyroidism, Nephrotic syndrome, Obesity, Peripheral vascular disease, Pulmonary hypertension (HCC), and Secondary hyperparathyroidism of renal origin.  Patient presents today via his podiatrist for ongoing gangrene of bilateral lower extremities.  This has been present for some time, but he has had worsening pain and worsening progression of the disease.  Was sent in by his podiatrist today for admission for surgery tomorrow.  Currently denies fevers, chills, chest pain, shortness breath, nausea, vomiting, diarrhea.   MDM   I have reviewed the nursing documentation, vital signs, as well as the past medical history, surgical history, family history, and social history.  Initial Assessment:  Patient hemodynamically stable on initial evaluation.  Spoke to podiatry Dr.Patel who is recommending bilateral foot MRIs, and consultation with vascular surgery, and then admission for operation tomorrow.   Spoke with vascular surgery.  They also plan to evaluate the patient tomorrow prior to operation.  Spoke with medicine team regarding admission and they agree.  Patient given Percocet for pain here.  MRIs will be obtained in the ED and the patient will be admitted to Dr. Tobie for further workup and management.  Pre-op  chest x-ray with cardiomegaly with mild congestion without overt pulmonary edema.  Otherwise no focal airspace disease, subdiaphragmatic air, or pneumothorax.  Pre-op  EKG normal sinus rhythm with no obvious ischemia, dysrhythmia, or high-grade AV block.  Has T wave inversions in the lateral leads,  without obvious pathologic T wave inversions.  Disposition:  I discussed the case with Dr.Patel who graciously agreed to admit the patient to their service for continued care.    This patient was staffed with Dr. Jakie who supervised the visit and agreed with the plan of care.   Due to the patients current presenting symptoms, physical exam findings, and the workup stated above, it is thought that the etiology of the patients current presentation is:  1. Gangrene (HCC)     Clinical Complexity A medically appropriate history, review of systems, and physical exam was performed.  Factors that affect the complexity of this encounter: assessment of correct protocol, laboratory work from this visit, notes from other physicians (podiatry), and review of echocardiogram/EKG results  My independent interpretations of diagnostic studies are documented in the ED course above.   If decision rules were used in this patient's evaluation, they are listed below.   Click here for ABCD2, HEART and other calculators  Patient's presentation is most consistent with acute presentation with potential threat to life or bodily function.  MDM generated using voice dictation software and may contain dictation errors. Please contact me for any clarification or with any questions.    Physical Exam, PMH, PSH, Family History, and Social Hsitory   Vitals:   06/23/24 1304 06/23/24 1322 06/23/24 1500 06/23/24 1515  BP: (!) 146/66  (!) 147/71 (!) 158/79  Pulse: 63  63 64  Resp: 18     Temp: 97.7 F (36.5 C)     SpO2: 97%  100% 100%  Weight:  74.8 kg    Height:  5' 10 (1.778 m)      Physical Exam Constitutional:      Appearance: Normal appearance.  HENT:     Head: Normocephalic and atraumatic.  Eyes:     Extraocular Movements: Extraocular movements intact.     Conjunctiva/sclera: Conjunctivae normal.  Cardiovascular:     Rate and Rhythm: Normal rate and regular rhythm.  Pulmonary:     Effort:  Pulmonary effort is normal. No respiratory distress.     Breath sounds: Normal breath sounds. No rales.  Chest:     Chest wall: No tenderness.  Abdominal:     General: Abdomen is flat.     Palpations: Abdomen is soft.     Tenderness: There is no abdominal tenderness. There is no guarding.  Musculoskeletal:     Right lower leg: No edema.     Left lower leg: No edema.  Skin:    General: Skin is warm and dry.     Comments: Right and left TMA with obvious gangrene of right and purulence on left foot.  Neurological:     General: No focal deficit present.     Mental Status: He is alert and oriented to person, place, and time.     Past Medical History:  Diagnosis Date   Anemia    Arthritis    Cardiomyopathy [name])    July 2022   Diabetes mellitus [facility])    Dialysis patient    HD MWF   GERD (gastroesophageal reflux disease)    HTN (hypertension)    Hyperlipidemia    Hypothyroidism    Nephrotic syndrome    HD MWF   Obesity    Peripheral vascular disease    Pulmonary hypertension (HCC)    July 2022 in setting of nephrotic syndrome   Secondary hyperparathyroidism of renal origin      Past Surgical History:  Procedure Laterality Date   ABDOMINAL AORTOGRAM N/A 04/16/2024   Procedure: ABDOMINAL AORTOGRAM;  Surgeon: Serene Gaile ORN, MD;  Location: [facility];  Service: Cardiovascular;  Laterality: N/A;   ABDOMINAL AORTOGRAM W/LOWER EXTREMITY N/A 01/30/2024   Procedure: ABDOMINAL AORTOGRAM W/LOWER EXTREMITY;  Surgeon: Serene Gaile ORN, MD;  Location: [facility];  Service: Cardiovascular;  Laterality: N/A;   ABDOMINAL AORTOGRAM W/LOWER EXTREMITY N/A 03/20/2024   Procedure: ABDOMINAL AORTOGRAM W/LOWER EXTREMITY;  Surgeon: Lanis Fonda BRAVO, MD;  Location: [facility] INVASIVE CV LAB;  Service: Cardiovascular;  Laterality: N/A;   AMPUTATION Left 03/25/2024   Procedure: AMPUTATION, FOOT, RAY LEFT;  Surgeon: Malvin Marsa FALCON, DPM;  Location: [facility] OR;  Service:  Orthopedics/Podiatry;  Laterality: Left;  Tranmetarsal amputation   AMPUTATION TOE Left 02/14/2024   Procedure: AMPUTATION, TOE LEFT SECOND AND THIRD TOES;  Surgeon: Gershon Donnice SAUNDERS, DPM;  Location: [facility] OR;  Service: Orthopedics/Podiatry;  Laterality: Left;   AV FISTULA PLACEMENT Left 12/27/2022   Procedure: INSERTION OF LEFT ARM ARTERIOVENOUS (AV) GORE-TEX GRAFT;  Surgeon: Eliza Lonni RAMAN, MD;  Location: [facility] OR;  Service: Vascular;  Laterality: Left;   AV FISTULA PLACEMENT Right 08/17/2023   Procedure: INSERTION OF ARTERIOVENOUS (AV) GORE-TEX GRAFT RIGHT ARM USING 4-62mm GORETEX STRETCH GRAFT;  Surgeon: Magda Debby SAILOR, MD;  Location: [facility] OR;  Service: Vascular;  Laterality: Right;   AVGG REMOVAL Left 03/15/2023   Procedure: REMOVAL OF LEFT UPPER ARM ARTERIOVENOUS GORETEX GRAFT (AVGG);  Surgeon: Magda Debby SAILOR, MD;  Location: [name];  Service: Vascular;  Laterality: Left;   BIOPSY  01/13/2023   Procedure: BIOPSY;  Surgeon: Cinderella Deatrice FALCON, MD;  Location: [facility];  Service: Endoscopy;;   CATARACT EXTRACTION, BILATERAL  2018   COLONOSCOPY  WITH PROPOFOL  N/A 01/13/2023   Procedure: COLONOSCOPY WITH PROPOFOL ;  Surgeon: Cinderella Deatrice FALCON, MD;  Location: [facility];  Service: Endoscopy;  Laterality: N/A;   DIALYSIS/PERMA CATHETER INSERTION Right 01/10/2024   Procedure: DIALYSIS/PERMA CATHETER INSERTION;  Surgeon: Gretta Lonni PARAS, MD;  Location: [facility];  Service: Cardiovascular;  Laterality: Right;   INSERTION OF DIALYSIS CATHETER Right 12/27/2022   Procedure: INSERTION OF Right Internal Jugular  DIALYSIS CATHETER;  Surgeon: Eliza Lonni RAMAN, MD;  Location[facility] OR;  Service: Vascular;  Laterality: Right;   INSERTION OF DIALYSIS CATHETER N/A 03/15/2023   Procedure: INSERTION OF TUNNELED DIALYSIS CATHETER;  Surgeon: Magda Debby SAILOR, MD;  [facility];  Service: Vascular;  Laterality: N/A;   INSERTION OF DIALYSIS CATHETER Right 03/25/2023   Procedure: INSERTION OF RIGHT  TUNNELED DIALYSIS CATHETER;  Surgeon: Pearline Norman RAMAN, MD;  Location: [name];  Service: Vascular;  Laterality: Right;   LOWER EXTREMITY ANGIOGRAPHY N/A 04/16/2024   Procedure: Lower Extremity Angiography;  Surgeon: Serene Gaile ORN, MD;  [facility];  Service: Cardiovascular;  Laterality: N/A;   LOWER EXTREMITY INTERVENTION  01/30/2024   Procedure: LOWER EXTREMITY INTERVENTION;  Surgeon: Serene Gaile ORN, MD;  [facility];  Service: Cardiovascular;;   LOWER EXTREMITY INTERVENTION N/A 04/16/2024   Procedure: LOWER EXTREMITY INTERVENTION;  Surgeon: Serene Gaile ORN, MD;  [facility];  Service: Cardiovascular;  Laterality: N/A;   PERIPHERAL INTRAVASCULAR LITHOTRIPSY  01/30/2024   Procedure: PERIPHERAL INTRAVASCULAR LITHOTRIPSY;  Surgeon: Serene Gaile ORN, MD;  [facility];  Service: Cardiovascular;;   PERIPHERAL VASCULAR ATHERECTOMY  01/30/2024   Procedure: PERIPHERAL VASCULAR ATHERECTOMY;  Surgeon: Serene Gaile ORN, MD;  [facility];  Service: Cardiovascular;;   PERIPHERAL VASCULAR THROMBECTOMY Right 12/20/2023   Procedure: PERIPHERAL VASCULAR THROMBECTOMY;  Surgeon: Pearline Norman RAMAN, MD;  Location: [facility];  Service: Cardiovascular;  Laterality: Right;   TEE WITHOUT CARDIOVERSION N/A 03/20/2023   Procedure: TRANSESOPHAGEAL ECHOCARDIOGRAM;  Surgeon: Barbaraann Darryle Debby, MD;  Location: [facility] OR;  Service: Cardiovascular;  Laterality: N/A;   TONSILLECTOMY     TRANSMETATARSAL AMPUTATION Right 04/26/2024   Procedure: RIGHT FOOT TRANSMETATARSAL AMPUTATION;  Surgeon: Malvin Marsa FALCON, DPM;  [facility];  Service: Orthopedics/Podiatry;  Laterality: Right;   VENOUS ANGIOPLASTY  12/20/2023   Procedure: VENOUS ANGIOPLASTY;  Surgeon: Pearline Norman RAMAN, MD;  Location: [facility];  Service: Cardiovascular;;   VENOUS STENT  12/20/2023   Procedure: VENOUS STENT;  Surgeon: Pearline Norman RAMAN, MD;  Location: [facility];  Service:  Cardiovascular;;  outflow     Family History  Problem Relation Age of Onset   Hypertension Mother    Diabetes Mother    Cancer Father        prostate cancer   Hypertension Maternal Grandfather    Rheum arthritis Maternal Grandmother    CAD Neg Hx     Social History   Tobacco Use   Smoking status: Never   Smokeless tobacco: Never  Substance Use Topics   Alcohol use: Never     Procedures   If procedures were preformed on this patient, they are listed below:  Procedures   Electronically signed by:   Glendia Carlin Ancona, M.D. PGY-2, Emergency Medicine   Please note that this documentation was produced with the assistance of voice-to-text technology and may contain errors.    Ancona Glendia, MD 06/23/24 [phone]  "

## 2024-06-23 NOTE — ED Provider Triage Note (Signed)
 Emergency Medicine Provider Triage Evaluation Note  AMONI MORALES , a 64 y.o. male  was evaluated in triage.  Pt complains of R foot pain, told by podiatry to come for admission and possible Sx tomorrow d/t gangrene.  Review of Systems  Positive: Pain in R foot Negative: Fever, chills, N/V  Physical Exam  BP (!) 146/66   Pulse 63   Temp 97.7 F (36.5 C)   Resp 18   Ht 5' 10 (1.778 m)   Wt 74.8 kg   SpO2 97%   BMI 23.68 kg/m  Gen:   Awake, no distress   Resp:  Normal effort  MSK:   Moves extremities without difficulty  Other:    Medical Decision Making  Medically screening exam initiated at 1:40 PM.  Appropriate orders placed.  BRITAIN SABER was informed that the remainder of the evaluation will be completed by another provider, this initial triage assessment does not replace that evaluation, and the importance of remaining in the ED until their evaluation is complete.  Labs ordered, podiatry on call secure chatted for further info   Francis Ileana LOISE DEVONNA 06/23/24 1341

## 2024-06-24 ENCOUNTER — Encounter (HOSPITAL_COMMUNITY): Payer: Self-pay | Admitting: Internal Medicine

## 2024-06-24 DIAGNOSIS — I96 Gangrene, not elsewhere classified: Secondary | ICD-10-CM | POA: Diagnosis not present

## 2024-06-24 LAB — CBC WITH DIFFERENTIAL/PLATELET
Abs Immature Granulocytes: 0.11 K/uL — ABNORMAL HIGH (ref 0.00–0.07)
Basophils Absolute: 0.1 K/uL (ref 0.0–0.1)
Basophils Relative: 1 %
Eosinophils Absolute: 0.4 K/uL (ref 0.0–0.5)
Eosinophils Relative: 4 %
HCT: 31.9 % — ABNORMAL LOW (ref 39.0–52.0)
Hemoglobin: 9.5 g/dL — ABNORMAL LOW (ref 13.0–17.0)
Immature Granulocytes: 1 %
Lymphocytes Relative: 10 %
Lymphs Abs: 1 K/uL (ref 0.7–4.0)
MCH: 28.8 pg (ref 26.0–34.0)
MCHC: 29.8 g/dL — ABNORMAL LOW (ref 30.0–36.0)
MCV: 96.7 fL (ref 80.0–100.0)
Monocytes Absolute: 0.4 K/uL (ref 0.1–1.0)
Monocytes Relative: 4 %
Neutro Abs: 8.3 K/uL — ABNORMAL HIGH (ref 1.7–7.7)
Neutrophils Relative %: 80 %
Platelets: 240 K/uL (ref 150–400)
RBC: 3.3 MIL/uL — ABNORMAL LOW (ref 4.22–5.81)
RDW: 15.3 % (ref 11.5–15.5)
WBC: 10.3 K/uL (ref 4.0–10.5)
nRBC: 0 % (ref 0.0–0.2)

## 2024-06-24 LAB — PHOSPHORUS: Phosphorus: 3.4 mg/dL (ref 2.5–4.6)

## 2024-06-24 LAB — COMPREHENSIVE METABOLIC PANEL WITH GFR
ALT: 9 U/L (ref 0–44)
AST: 18 U/L (ref 15–41)
Albumin: 2.9 g/dL — ABNORMAL LOW (ref 3.5–5.0)
Alkaline Phosphatase: 131 U/L — ABNORMAL HIGH (ref 38–126)
Anion gap: 14 (ref 5–15)
BUN: 33 mg/dL — ABNORMAL HIGH (ref 8–23)
CO2: 23 mmol/L (ref 22–32)
Calcium: 8.5 mg/dL — ABNORMAL LOW (ref 8.9–10.3)
Chloride: 100 mmol/L (ref 98–111)
Creatinine, Ser: 6.36 mg/dL — ABNORMAL HIGH (ref 0.61–1.24)
GFR, Estimated: 9 mL/min — ABNORMAL LOW
Glucose, Bld: 125 mg/dL — ABNORMAL HIGH (ref 70–99)
Potassium: 4.4 mmol/L (ref 3.5–5.1)
Sodium: 138 mmol/L (ref 135–145)
Total Bilirubin: 0.3 mg/dL (ref 0.0–1.2)
Total Protein: 6.3 g/dL — ABNORMAL LOW (ref 6.5–8.1)

## 2024-06-24 LAB — MAGNESIUM: Magnesium: 2 mg/dL (ref 1.7–2.4)

## 2024-06-24 LAB — HEPATITIS B SURFACE ANTIGEN: Hepatitis B Surface Ag: NONREACTIVE

## 2024-06-24 MED ORDER — HEPARIN SODIUM (PORCINE) 1000 UNIT/ML DIALYSIS
1000.0000 [IU] | INTRAMUSCULAR | Status: DC | PRN
  Administered 2024-06-29: 3200 [IU]
  Filled 2024-06-24: qty 1

## 2024-06-24 MED ORDER — ENSURE PLUS HIGH PROTEIN PO LIQD
237.0000 mL | Freq: Two times a day (BID) | ORAL | Status: DC
  Administered 2024-06-27 – 2024-07-06 (×7): 237 mL via ORAL

## 2024-06-24 MED ORDER — SODIUM CHLORIDE 0.9% FLUSH
10.0000 mL | INTRAVENOUS | Status: DC | PRN
  Administered 2024-06-27: 10 mL

## 2024-06-24 MED ORDER — LIDOCAINE HCL (PF) 1 % IJ SOLN: 5.0000 mL | INTRAMUSCULAR | Status: DC | PRN

## 2024-06-24 MED ORDER — LIDOCAINE-PRILOCAINE 2.5-2.5 % EX CREA: 1.0000 | TOPICAL_CREAM | CUTANEOUS | Status: DC | PRN

## 2024-06-24 MED ORDER — ANTICOAGULANT SODIUM CITRATE 4% (200MG/5ML) IV SOLN: 5.0000 mL | Status: DC | PRN

## 2024-06-24 MED ORDER — HEPARIN SODIUM (PORCINE) 1000 UNIT/ML IJ SOLN
3200.0000 [IU] | Freq: Once | INTRAMUSCULAR | Status: AC
  Administered 2024-06-24: 3200 [IU]

## 2024-06-24 MED ORDER — VANCOMYCIN HCL 750 MG/150ML IV SOLN
INTRAVENOUS | Status: AC
  Filled 2024-06-24: qty 150

## 2024-06-24 MED ORDER — ALTEPLASE 2 MG IJ SOLR: 2.0000 mg | Freq: Once | INTRAMUSCULAR | Status: DC | PRN

## 2024-06-24 NOTE — Progress Notes (Addendum)
 Received patient in bed to unit.  Alert and oriented.  Informed consent signed and in chart.   TX duration:3 hours and 24 minutes.  Patient was irritated and wanted off with 6 minutes left.  He was irritated and angry most of dialysis.  Patient tolerated well.  Transported back to the room  Alert, without acute distress.  Hand-off given to patient's nurse.   Access used: HD cath r chest Access issues: none  Total UF removed: 2.9L Med given: Vancomycin    06/24/24 1902  Vitals  Temp 97.8 F (36.6 C)  Temp Source Oral  BP (!) 153/103  ECG Heart Rate 77  Resp 16  Oxygen Therapy  SpO2 94 %  O2 Device Room Air  During Treatment Monitoring  Duration of HD Treatment -hour(s) 3.4 hour(s)  HD Safety Checks Performed Yes  Intra-Hemodialysis Comments See progress note (Patient irritated and wanted to come off early.)  Post Treatment  Dialyzer Clearance Lightly streaked  Liters Processed 81.5  Fluid Removed (mL) 2900 mL  Tolerated HD Treatment No (Comment)  Post-Hemodialysis Comments Patient was restless and irritated at the 2 hour mark  Hemodialysis Catheter Right Internal jugular Double lumen Permanent (Tunneled)  Placement Date/Time: 01/10/24 1548   Placed prior to admission: No  Serial / Lot #: [id number]  Expiration Date: 08/10/28  Time Out: Correct patient;Correct site;Correct procedure  Maximum sterile barrier precautions: Hand hygiene;Large sterile sheet;S...  Site Condition No complications  Blue Lumen Status Flushed;Antimicrobial dead end cap;Heparin  locked  Red Lumen Status Flushed;Antimicrobial dead end cap;Heparin  locked  Purple Lumen Status N/A  Catheter fill solution Heparin  1000 units/ml  Catheter fill volume (Arterial) 1.6 cc  Catheter fill volume (Venous) 1.6  Dressing Type Transparent  Dressing Status Antimicrobial disc/dressing in place;Clean, Dry, Intact  Drainage Description None  Dressing Change Due 07/01/24  Post treatment catheter status Capped and  Clamped     Camellia Brasil LPN [facility]

## 2024-06-24 NOTE — Consult Note (Signed)
 [facility] Renal Consultation Note    Indication for Consultation:  Management of ESRD/hemodialysis; anemia, hypertension/volume and secondary hyperparathyroidism  ERE:Fnmmnt, Beverley, MD  HPI: [name] [name] is a 64 y.o. male with ESRD on HD MWF at [facility]. He has a past medical history significant for poorly controlled type 2 diabetes mellitus, severe peripheral arterial disease, hyperlipidemia, hypothyroidism, and cardiomyopathy who presents to the ED from the Podiatry office with bilateral gangrene and nonhealing wounds.  Patient underwent left foot ray amputation on 03/25/2024 and right transmetatarsal amputation on 04/26/2024. Podiatry and VVS are both on board. Reviewed both Podiatry and VVS's notes. Plan for left-sided TMA revision either today or tomorrow and right-sided BKA on Wednesday (06/26/24). We were consulted for ongoing dialysis needs. His last HD was on 06/21/24 full treatment. CXR showed no overt edema. Current renal lab are stable. He denies SOB, CP, and N/V. Plan for HD sometime today per his routine schedule.  Past Medical History:  Diagnosis Date   Anemia    Arthritis    Cardiomyopathy [facility])    July 2022   Diabetes mellitus [facility])    Dialysis patient    HD MWF   GERD (gastroesophageal reflux disease)    HTN (hypertension)    Hyperlipidemia    Hypothyroidism    Nephrotic syndrome    HD MWF   Obesity    Peripheral vascular disease    Pulmonary hypertension (HCC)    July 2022 in setting of nephrotic syndrome   Secondary hyperparathyroidism of renal origin    Past Surgical History:  Procedure Laterality Date   ABDOMINAL AORTOGRAM N/A 04/16/2024   Procedure: ABDOMINAL AORTOGRAM;  Surgeon: Serene Gaile ORN, MD;  [facility]: [facility] INVASIVE CV LAB;  Service: Cardiovascular;  Laterality: N/A;   ABDOMINAL AORTOGRAM W/LOWER EXTREMITY N/A 01/30/2024   Procedure: ABDOMINAL AORTOGRAM W/LOWER EXTREMITY;  Surgeon: Serene Gaile ORN, MD;  Location: [facility]  INVASIVE CV LAB;  Service: Cardiovascular;  Laterality: N/A;   ABDOMINAL AORTOGRAM W/LOWER EXTREMITY N/A 03/20/2024   Procedure: ABDOMINAL AORTOGRAM W/LOWER EXTREMITY;  Surgeon: Lanis Fonda BRAVO, MD;  Location: [facility] INVASIVE CV LAB;  Service: Cardiovascular;  Laterality: N/A;   AMPUTATION Left 03/25/2024   Procedure: AMPUTATION, FOOT, RAY LEFT;  Surgeon: Malvin Marsa FALCON, DPM;  Location: [facility] OR;  Service: Orthopedics/Podiatry;  Laterality: Left;  Tranmetarsal amputation   AMPUTATION TOE Left 02/14/2024   Procedure: AMPUTATION, TOE LEFT SECOND AND THIRD TOES;  Surgeon: Gershon Donnice SAUNDERS, DPM;  Location: [facility] OR;  Service: Orthopedics/Podiatry;  Laterality: Left;   AV FISTULA PLACEMENT Left 12/27/2022   Procedure: INSERTION OF LEFT ARM ARTERIOVENOUS (AV) GORE-TEX GRAFT;  Surgeon: Eliza Lonni RAMAN, MD;  Location: [facility] OR;  Service: Vascular;  Laterality: Left;   AV FISTULA PLACEMENT Right 08/17/2023   Procedure: INSERTION OF ARTERIOVENOUS (AV) GORE-TEX GRAFT RIGHT ARM USING 4-39mm GORETEX STRETCH GRAFT;  Surgeon: Magda Debby SAILOR, MD;  Location: [facility] OR;  Service: Vascular;  Laterality: Right;   AVGG REMOVAL Left 03/15/2023   Procedure: REMOVAL OF LEFT UPPER ARM ARTERIOVENOUS GORETEX GRAFT (AVGG);  Surgeon: Magda Debby SAILOR, MD;  Location: [facility] OR;  Service: Vascular;  Laterality: Left;   BIOPSY  01/13/2023   Procedure: BIOPSY;  Surgeon: Cinderella Deatrice FALCON, MD;  Location: [facility] ENDO SUITE;  Service: Endoscopy;;   CATARACT EXTRACTION, BILATERAL  2018   COLONOSCOPY WITH PROPOFOL  N/A 01/13/2023   Procedure: COLONOSCOPY WITH PROPOFOL ;  Surgeon: Cinderella Deatrice FALCON, MD;  Location: [facility] ENDO SUITE;  Service: Endoscopy;  Laterality: N/A;  DIALYSIS/PERMA CATHETER INSERTION Right 01/10/2024   Procedure: DIALYSIS/PERMA CATHETER INSERTION;  Surgeon: Gretta Lonni PARAS, MD;  Location: [facility] PV LAB;  Service: Cardiovascular;  Laterality: Right;   INSERTION OF DIALYSIS CATHETER Right 12/27/2022   Procedure: INSERTION OF  Right Internal Jugular  DIALYSIS CATHETER;  Surgeon: Eliza Lonni RAMAN, MD;  Location: [facility] OR;  Service: Vascular;  Laterality: Right;   INSERTION OF DIALYSIS CATHETER N/A 03/15/2023   Procedure: INSERTION OF TUNNELED DIALYSIS CATHETER;  Surgeon: Magda Debby SAILOR, MD;  [facility] OR;  Service: Vascular;  Laterality: N/A;   INSERTION OF DIALYSIS CATHETER Right 03/25/2023   Procedure: INSERTION OF RIGHT TUNNELED DIALYSIS CATHETER;  Surgeon: Pearline Norman RAMAN, MD;  Location: [facility] OR;  Service: Vascular;  Laterality: Right;   LOWER EXTREMITY ANGIOGRAPHY N/A 04/16/2024   Procedure: Lower Extremity Angiography;  Surgeon: Serene Gaile ORN, MD;  [facility] IN[facility];  Service: Cardiovascular;  Laterality: N/A;   LOWER EXTREMITY INTERVENTION  01/30/2024   Procedure: LOWER EXTREMITY INTERVENTION;  Surgeon: Serene Gaile ORN, MD;  [facility];  Service: Cardiovascular;;   LOWER EXTREMITY INTERVENTION N/A 04/16/2024   Procedure: LOWER EXTREMITY INTERVENTION;  Surgeon: Serene Gaile ORN, MD;  [facility];  Service: Cardiovascular;  Laterality: N/A;   PERIPHERAL INTRAVASCULAR LITHOTRIPSY  01/30/2024   Procedure: PERIPHERAL INTRAVASCULAR LITHOTRIPSY;  Surgeon: Serene Gaile ORN, MD;  [facility];  Service: Cardiovascular;;   PERIPHERAL VASCULAR ATHERECTOMY  01/30/2024   Procedure: PERIPHERAL VASCULAR ATHERECTOMY;  Surgeon: Serene Gaile ORN, MD;  [facility];  Service: Cardiovascular;;   PERIPHERAL VASCULAR THROMBECTOMY Right 12/20/2023   Procedure: PERIPHERAL VASCULAR THROMBECTOMY;  Surgeon: Pearline Norman RAMAN, MD;  Location: [facility];  Service: Cardiovascular;  Laterality: Right;   TEE WITHOUT CARDIOVERSION N/A 03/20/2023   Procedure: TRANSESOPHAGEAL ECHOCARDIOGRAM;  Surgeon: Barbaraann Darryle Debby, MD;  Location: [facility] OR;  Service: Cardiovascular;  Laterality: N/A;   TONSILLECTOMY     TRANSMETATARSAL AMPUTATION Right 04/26/2024   Procedure:  RIGHT FOOT TRANSMETATARSAL AMPUTATION;  Surgeon: Malvin Marsa FALCON, DPM;  [facility] OR;  Service: Orthopedics/Podiatry;  Laterality: Right;   VENOUS ANGIOPLASTY  12/20/2023   Procedure: VENOUS ANGIOPLASTY;  Surgeon: Pearline Norman RAMAN, MD;  Location: [facility];  Service: Cardiovascular;;   VENOUS STENT  12/20/2023   Procedure: VENOUS STENT;  Surgeon: Pearline Norman RAMAN, MD;  Location: [facility];  Service: Cardiovascular;;  outflow   Family History  Problem Relation Age of Onset   Hypertension Mother    Diabetes Mother    Cancer Father        prostate cancer   Hypertension Maternal Grandfather    Rheum arthritis Maternal Grandmother    CAD Neg Hx    Social History:  reports that he has never smoked. He has never used smokeless tobacco. He reports that he does not drink alcohol and does not use drugs. Allergies[1] Prior to Admission medications  Medication Sig Start Date End Date Taking? Authorizing Provider  acetaminophen  (TYLENOL ) 500 MG tablet Take 500-1,000 mg by mouth every 8 (eight) hours as needed for moderate pain (pain score 4-6).   Yes [provider]  amitriptyline  (ELAVIL ) 10 MG tablet Take 10 mg by mouth at bedtime.   Yes [provider]  amLODipine  (NORVASC ) 10 MG tablet Take 1 tablet (10 mg total) by mouth every morning. 03/29/23  Yes Regalado, Belkys A, MD  aspirin  (ASPIRIN  CHILDRENS) 81 MG chewable tablet Chew 1 tablet (81  mg total) by mouth daily. 01/30/24 01/29/25 Yes Serene Gaile ORN, MD  atorvastatin  (LIPITOR) 40 MG tablet Take 1 tablet (40 mg total) by mouth daily. 03/28/24  Yes Gonfa, Taye T, MD  calcium  acetate (PHOSLO ) 667 MG capsule Take 667 mg by mouth 3 (three) times daily with meals. 07/07/23 06/27/24 Yes [provider]  clopidogrel  (PLAVIX ) 75 MG tablet Take 1 tablet (75 mg total) by mouth daily. 01/30/24  Yes Serene Gaile ORN, MD  Cyanocobalamin  (VITAMIN B12 PO) Take 1 tablet by mouth at bedtime.   Yes [provider]   gabapentin  (NEURONTIN ) 100 MG capsule Take 100 mg by mouth at bedtime. 07/05/23  Yes [provider]  levothyroxine  (SYNTHROID ) 50 MCG tablet Take 50 mcg by mouth daily before breakfast.   Yes [provider]  metoprolol  tartrate (LOPRESSOR ) 50 MG tablet Take 1 tablet (50 mg total) by mouth 2 (two) times daily. 06/16/20  Yes Comer Kirsch, PA-C  Multiple Vitamin (MULTIVITAMIN WITH MINERALS) TABS tablet Take 1 tablet by mouth daily. Men's Multivitamin for 50+   Yes [provider]  oxyCODONE  (OXY IR/ROXICODONE ) 5 MG immediate release tablet Take 1 tablet (5 mg total) by mouth every 4 (four) hours as needed for moderate pain (pain score 4-6). 04/27/24  Yes Cindy Garnette POUR, MD  pantoprazole  (PROTONIX ) 40 MG tablet TAKE 1 TABLET BY MOUTH ONCE DAILY 30  MINUTES  BEFORE  BREAKFAST 05/20/24  Yes Carlan, Chelsea L, NP  Probiotic Product (PROBIOTIC DAILY PO) Take 1 capsule by mouth at bedtime.   Yes [provider]  Accu-Chek Softclix Lancets lancets  08/06/20   [provider]   Current Facility-Administered Medications  Medication Dose Route Frequency Provider Last Rate Last Admin   0.9 %  sodium chloride  infusion   Intravenous Continuous Tobie Mario GAILS, MD 75 mL/hr at 06/24/24 1419 Infusion Verify at 06/24/24 1419   acetaminophen  (TYLENOL ) tablet 650 mg  650 mg Oral Q6H PRN Patel, Ekta V, MD       Or   acetaminophen  (TYLENOL ) suppository 650 mg  650 mg Rectal Q6H PRN Patel, Ekta V, MD       alteplase  (CATHFLO ACTIVASE ) injection 2 mg  2 mg Intracatheter Once PRN Geralynn Charleston, MD       amitriptyline  (ELAVIL ) tablet 10 mg  10 mg Oral QHS Tobie Mario V, MD   10 mg at 06/23/24 2327   amLODipine  (NORVASC ) tablet 10 mg  10 mg Oral q morning Patel, Ekta V, MD   10 mg at 06/24/24 9065   anticoagulant sodium citrate  solution 5 mL  5 mL Intracatheter PRN Geralynn Charleston, MD       atorvastatin  (LIPITOR) tablet 40 mg  40 mg Oral Daily Patel, Ekta V, MD   40 mg at  06/24/24 0934   calcium  acetate (PHOSLO ) capsule 667 mg  667 mg Oral TID WC Patel, Ekta V, MD   667 mg at 06/24/24 0828   ceFEPIme  (MAXIPIME ) 1 g in sodium chloride  0.9 % 100 mL IVPB  1 g Intravenous Q24H Gaines Carrier, RPH   Paused at 06/23/24 2135   Chlorhexidine  Gluconate Cloth 2 % PADS 6 each  6 each Topical Q0600 Geralynn Charleston, MD       heparin  injection 1,000 Units  1,000 Units Intracatheter PRN Geralynn Charleston, MD       heparin  injection 5,000 Units  5,000 Units Subcutaneous Q8H Tobie Mario GAILS, MD   5,000 Units at 06/24/24 1427   heparin  sodium (porcine) injection  3,200 Units  3,200 Units Intracatheter Once Geralynn Charleston, MD       hydrALAZINE  (APRESOLINE ) injection 10 mg  10 mg Intravenous Q6H PRN Tobie Mario GAILS, MD       levothyroxine  (SYNTHROID ) tablet 50 mcg  50 mcg Oral QAC breakfast Tobie Mario GAILS, MD   50 mcg at 06/24/24 9171   lidocaine  (PF) (XYLOCAINE ) 1 % injection 5 mL  5 mL Intradermal PRN Geralynn Charleston, MD       lidocaine -prilocaine  (EMLA ) cream 1 Application  1 Application Topical PRN Geralynn Charleston, MD       metoprolol  tartrate (LOPRESSOR ) tablet 50 mg  50 mg Oral BID Tobie Mario GAILS, MD   50 mg at 06/24/24 9065   metroNIDAZOLE  (FLAGYL ) IVPB 500 mg  500 mg Intravenous Q12H Tobie Mario GAILS, MD   Stopped at 06/24/24 9068   morphine  (PF) 2 MG/ML injection 2 mg  2 mg Intravenous Q2H PRN Patel, Ekta V, MD   2 mg at 06/24/24 1129   ondansetron  (ZOFRAN ) tablet 4 mg  4 mg Oral Q6H PRN Tobie Mario GAILS, MD       Or   ondansetron  (ZOFRAN ) injection 4 mg  4 mg Intravenous Q6H PRN Tobie Mario GAILS, MD       oxyCODONE  (Oxy IR/ROXICODONE ) immediate release tablet 5 mg  5 mg Oral Q4H PRN Tobie Mario GAILS, MD       pantoprazole  (PROTONIX ) EC tablet 40 mg  40 mg Oral Daily Tobie Mario V, MD   40 mg at 06/24/24 0934   polyethylene glycol (MIRALAX  / GLYCOLAX ) packet 17 g  17 g Oral Daily PRN Tobie Mario GAILS, MD       sodium chloride  flush (NS) 0.9 % injection 10-40 mL  10-40 mL Intracatheter PRN Leotis Bogus, MD       vancomycin  (VANCOREADY) IVPB 750 mg/150 mL  750 mg Intravenous Q M,W,F-HD Gaines Carrier, [facility]       Labs: Basic Metabolic Panel: Recent Labs  Lab 06/23/24 1340 06/24/24 0545  NA 138 138  K 3.8 4.4  CL 99 100  CO2 25 23  GLUCOSE 236* 125*  BUN 25* 33*  CREATININE 5.51* 6.36*  CALCIUM  8.6* 8.5*  PHOS  --  3.4   Liver Function Tests: Recent Labs  Lab 06/24/24 0545  AST 18  ALT 9  ALKPHOS 131*  BILITOT 0.3  PROT 6.3*  ALBUMIN  2.9*   No results for input(s): LIPASE, AMYLASE in the last 168 hours. No results for input(s): AMMONIA in the last 168 hours. CBC: Recent Labs  Lab 06/23/24 1340 06/24/24 0545  WBC 10.9* 10.3  NEUTROABS 8.7* 8.3*  HGB 10.6* 9.5*  HCT 36.2* 31.9*  MCV 98.6 96.7  PLT 247 240   Cardiac Enzymes: No results for input(s): CKTOTAL, CKMB, CKMBINDEX, TROPONINI in the last 168 hours. CBG: Recent Labs  Lab 06/23/24 1803  GLUCAP 154*   Iron Studies: No results for input(s): IRON, TIBC, TRANSFERRIN, FERRITIN in the last 72 hours. Studies/Results: MR FOOT RIGHT W WO CONTRAST Addendum Date: 06/23/2024 ADDENDUM REPORT: 06/23/2024 21:57 ADDENDUM: The postcontrast images were missing from the original series. These are now available for review. These demonstrate a large open at the metatarsal resection site with gas in the soft tissues creating the artifact. Nonenhancing soft tissue noted consistent with tissue necrosis. Enhancement in the remaining metatarsal bones consistent with osteomyelitis. Electronically Signed   By: MYRTIS Stammer M.D.   On: 06/23/2024 21:57   Result Date: 06/23/2024  CLINICAL DATA:  Recent transmetatarsal amputation of the right foot (04/26/2024). Pain, swelling and wound dehiscence with suspected gangrene. EXAM: MRI OF THE RIGHT FOREFOOT WITHOUT AND WITH CONTRAST TECHNIQUE: Multiplanar, multisequence MR imaging of the right foot was performed before and after the administration of intravenous  contrast. CONTRAST:  7.5mL GADAVIST  GADOBUTROL  1 MMOL/ML IV SOLN COMPARISON:  Radiographs 04/26/2024 FINDINGS: Significant artifact noted at the amputation site possibly related to gas or skin staples. Abnormal T1 and T2 signal intensity in the remaining metatarsal shafts highly suspicious bone infarcts and osteomyelitis. No findings suspicious for septic arthritis at the tarsal metatarsal joints. No obvious discrete drainable fluid collection in the subcutaneous tissues. There is diffuse cellulitis and myofasciitis. The hindfoot bony structures are intact. No evidence of tibiotalar or subtalar septic arthritis. Edema like signal changes in the foot musculature likely a combination loss of tension/function from nonfunctioning tendons and myofasciitis. IMPRESSION: 1. Significant artifact noted at the amputation site possibly related to gas or skin staples. 2. Abnormal T1 and T2 signal intensity in the remaining metatarsal shafts highly suspicious for bone infarcts and osteomyelitis. 3. No findings suspicious for septic arthritis at the tarsometatarsal joints. 4. Diffuse cellulitis and myofasciitis. Electronically Signed: By: MYRTIS Stammer M.D. On: 06/23/2024 17:51   MR FOOT LEFT W WO CONTRAST Result Date: 06/23/2024 CLINICAL DATA:  Status post mid metatarsal amputation with wound dehiscence and suspect gangrene. EXAM: MRI OF THE LEFT FOREFOOT WITHOUT AND WITH CONTRAST TECHNIQUE: Multiplanar, multisequence MR imaging of the left foot was performed both before and after administration of intravenous contrast. CONTRAST:  7.5mL GADAVIST  GADOBUTROL  1 MMOL/ML IV SOLN COMPARISON:  Radiographs 05/30/2024 FINDINGS: Open wounds noted along the mid metatarsal surgical site most notably medially overlying the residual first metatarsal. Diffuse nonenhancing soft tissue noted consistent with tissue necrosis/nonviable tissue. No rim enhancing abscess. Abnormal T1 and T2 signal intensity and subsequent contrast enhancement in the  metatarsals consistent with osteomyelitis. No midfoot or hindfoot septic arthritis or osteomyelitis. Diffuse myofasciitis without definite findings for pyomyositis. IMPRESSION: 1. Open wounds along the mid metatarsal surgical site most notably medially overlying the residual first metatarsal. Diffuse nonenhancing soft tissue noted consistent with tissue necrosis/nonviable tissue. No rim enhancing abscess. 2. Abnormal T1 and T2 signal intensity and subsequent contrast enhancement in the residual metatarsals consistent with osteomyelitis. 3. Diffuse myofasciitis without definite findings for pyomyositis. Electronically Signed   By: MYRTIS Stammer M.D.   On: 06/23/2024 18:15   DG Chest Portable 1 View Result Date: 06/23/2024 CLINICAL DATA:  Preoperative evaluation EXAM: PORTABLE CHEST 1 VIEW COMPARISON:  05/13/2024 FINDINGS: Single frontal view of the chest demonstrates right internal jugular dialysis catheter. Cardiac silhouette is enlarged but stable. Mild chronic vascular congestion without airspace disease, effusion, or pneumothorax. No acute bony abnormality. IMPRESSION: 1. Stable enlargement of the cardiac silhouette. 2. Mild chronic pulmonary vascular congestion.  No overt edema. Electronically Signed   By: Ozell Daring M.D.   On: 06/23/2024 15:52    ROS: All others negative except those listed in HPI.   Physical Exam: Vitals:   06/24/24 1534 06/24/24 1600 06/24/24 1630 06/24/24 1700  BP:  (!) 149/67 (!) 146/72 (!) 150/73  Pulse:      Resp:  18 18 11   Temp:      TempSrc:      SpO2: 96% 97% 95% 92%  Weight:      Height:         General: WDWN NAD Head: NCAT sclera not icteric  Lungs: CTA bilaterally.  No wheeze, rales or rhonchi. Breathing is unlabored. Heart: RRR. No murmur, rubs or gallops.  Abdomen: soft and non-tender  Lower extremities: No LE edema; B/l TMAs-bilateral feet wrapped Neuro: AAOx3. Moves all extremities spontaneously. Dialysis Access: R [facility]  Dialysis Orders:  MWF -  [facility] 4hrs, BFR 400, DFR Auto 1.5,  EDW 63.8kg, 3K/ 2.5Ca Mircera 225 mcg q2wks - last 06/21/24  Last Labs: Hgb 9.5, K 4.4, Ca 8.5, P 3.4,  Alb 2.9  Assessment/Plan: Gangrene left/right foot, s/p b/l TMA - Podiatry and VVS following. Plan for left-sided TMA revision either today or tomorrow (06/25/24) and right-sided BKA on Wednesday (06/26/24). ESRD - on HD. Plan for HD today per his routine schedule Hypertension/volume  - Bps elevated, euvolemic on exam, resume home Bp meds Anemia of CKD - ESA given 1/9 in outpatient, not due yet Secondary Hyperparathyroidism -  Checking phos is AM Nutrition - Renal diet with fluid restriction  Charmaine Piety, NP [facility] 06/24/2024, 5:07 PM      [1] No Known Allergies

## 2024-06-24 NOTE — Progress Notes (Signed)
 Pt receives out-pt HD at [facility], MWF, 1210 chair time. Will continue to assists as needed.   Lavanda Alegandra Sommers Dialysis Navigator [phone]

## 2024-06-24 NOTE — ED Notes (Signed)
 Floor notified patient coming up

## 2024-06-24 NOTE — Consult Note (Signed)
 "  PODIATRY CONSULTATION  NAME [name] [name] MRN [id number] DOB [birth year] DOA 06/23/2024   Reason for consult: Gangrene bilateral TMA  Attending/Consulting physician: MYRTIS Dawley MD  History of present illness: Mr. Colmenares is a 64 year old male with past medical history significant for poorly controlled type 2 diabetes mellitus, end-stage renal disease on hemodialysis (M/W/F schedule), severe peripheral arterial disease, hyperlipidemia, hypothyroidism, and cardiomyopathy who presents from podiatry clinic with bilateral foot gangrene and nonhealing wounds.   The patient underwent left foot ray amputation on 03/25/2024 and right transmetatarsal amputation on 04/26/2024. Despite these interventions and multiple prior revascularization procedures (most recently 04/16/2024), he now presents with dry gangrene/necrotic eschar at the right TMA site and nonhealing wounds at the left TMA site. The patient reports no purulence or crepitance. He denies fever, chills, or systemic symptoms of infection.  Patient known to me well from bilateral foot TMA.  Has been following up in the office.  Have noted worsening progressive gangrene on the right foot.  He has also had wounds with full-thickness dehiscence down to bone on the left side.  Less necrosis on the left side however.  At last visit last week Thursday I noted that there was complete necrosis of the right forefoot at this point that is nonsalvageable and recommended we get him into the hospital for surgical intervention this week on both sides including amputation per vascular surgery on the right as well as a revision midfoot amputation on the left to try and salvage and keep him 1 foot for ambulation on the left side.  Past Medical History:  Diagnosis Date   Anemia    Arthritis    Cardiomyopathy [facility])    July 2022   Diabetes mellitus [facility])    Dialysis patient    HD MWF   GERD (gastroesophageal reflux disease)    HTN (hypertension)     Hyperlipidemia    Hypothyroidism    Nephrotic syndrome    HD MWF   Obesity    Peripheral vascular disease    Pulmonary hypertension (HCC)    July 2022 in setting of nephrotic syndrome   Secondary hyperparathyroidism of renal origin        Latest Ref Rng & Units 06/24/2024    5:45 AM 06/23/2024    1:40 PM 05/13/2024    4:00 PM  CBC  WBC 4.0 - 10.5 K/uL 10.3  10.9  7.3   Hemoglobin 13.0 - 17.0 g/dL 9.5  89.3  89.7   Hematocrit 39.0 - 52.0 % 31.9  36.2  34.4   Platelets 150 - 400 K/uL 240  247  167        Latest Ref Rng & Units 06/24/2024    5:45 AM 06/23/2024    1:40 PM 05/13/2024    4:00 PM  BMP  Glucose 70 - 99 mg/dL 874  763  843   BUN 8 - 23 mg/dL 33  25  33   Creatinine 0.61 - 1.24 mg/dL 3.63  4.48  2.46   Sodium 135 - 145 mmol/L 138  138  139   Potassium 3.5 - 5.1 mmol/L 4.4  3.8  4.9   Chloride 98 - 111 mmol/L 100  99  102   CO2 22 - 32 mmol/L 23  25  21    Calcium  8.9 - 10.3 mg/dL 8.5  8.6  8.9       Physical Exam: Lower Extremity Exam  From office Thursday last week:  General AA&O x3. Normal mood and affect.  Vascular Bilateral feet are warm to the ankle  Neurologic Epicritic sensation grossly reduced.  Dermatologic Sutures and staples are intact to the right foot TMA site - Necrosis of the right foot TMA site encompasing the entire amputation site, much worse than prior though is dry and does not appear to be wet gangrene or gas producing infection at this time          On the left foot there is a full thickness dehiscence of the medial aspect with first metatarsal bone exposed.  The tissue at the dehisced sites on the left foot appears slightly more fibrotic and less necrotic at this visit            Orthopedic: MMT 5/5 in dorsiflexion, plantarflexion, inversion, and eversion. Normal joint ROM without pain or crepitus.     ASSESSMENT/PLAN OF CARE 64 y.o. 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  - N.p.o. past midnight for OR tomorrow approximately 9 30-10 for left foot revision transmetatarsal amputation to Lisfranc amputation.  Hopeful for some healing on the left side however still at risk for needing proximal amputation in the future given his vascular status. -Appreciate Dr. Silver and vascular team planning for right lower extremity proximal amputation on Wednesday or later this week. - Continue IV abx broad spectrum pending further culture data - Anticoagulation: Okay to continue per primary or vascular recommendation  - Wound care: Leave dressings clean dry and intact to bilateral foot for this time - WB status: Nonweightbearing bilateral lower extremity for now after left side will be nonweightbearing on the left and also after amputation will be nonweightbearing on right likely needs skilled nursing facility upon discharge - Will continue to follow   Thank you for the consult.  Please contact me directly with any questions or concerns.           Marolyn JULIANNA Honour, DPM [facility]    [address] [place], [place] [address]                Office [phone]  Fax [phone]     "

## 2024-06-24 NOTE — ED Notes (Signed)
 Pharmacy messaged to send dose of vancomycin .

## 2024-06-24 NOTE — Progress Notes (Signed)
 " PROGRESS NOTE    [name] [name]  FMW:[id number] DOB: [birth year] DOA: 06/23/2024  PCP: Kip Righter, MD   Brief Narrative:  This 64 yrs old Male with PMH significant for poorly controlled type 2 diabetes mellitus, End-stage renal disease on hemodialysis (M/W/F schedule), severe peripheral arterial disease, hyperlipidemia, hypothyroidism, and cardiomyopathy who presents from podiatry clinic with bilateral foot gangrene and nonhealing wounds.  Patient underwent left foot ray amputation on 03/25/24 and Right TMA on 04/26/24.  Despite these intervention and multiple prior revascularization procedures he now presents with dry gangrene/necrotic eschar at the right TMA site and nonhealing wounds at the left TMA site. His vascular history is extensive, including multiple lower extremity angiograms with interventions (01/30/2024, 03/20/2024, 04/16/2024) including peripheral atherectomy, intravascular lithotripsy, and angioplasty. He has bilateral AV grafts for dialysis access (right arm 08/17/2023, left arm 12/27/2022) and currently has a right internal jugular dialysis catheter.  MRI left foot is ordered.  Patient was admitted for further evaluation.  Vascular surgery and podiatry is consulted.  Assessment & Plan:   Principal Problem:   Gangrene of foot (HCC)  Dry gangrene / Necrotic eschar Right TMA site; Left foot nonhealing post TMA : Bilateral Osteomyelitis: Severe PAD / Limb ischemia: Continue empiric antibiotics (Vancomycin , cefepime , Flagyl ) MRI Right Foot > highly suspicious for bone infarcts and osteomyelitis.  Diffuse cellulitis mild fasciitis. MRI left foot> Open wounds along mid metatarsal surgical site, soft tissue inflammation consistent with tissue necrosis nonviable tissue.  Osteomyelitis. Podiatry Plan is for left-sided TMA revision tomorrow. Vascular surgery planning right-sided BKA on Wednesday. Nonweightbearing bilateral lower extremity for now.  End-stage renal disease on  hemodialysis: Nephrology is consulted for dialysis coordination. Continue volume status, monitor electrolytes.  Type 2 diabetes: Hold p.o. diabetic medications Continue regular insulin  sliding scale  Chronic combined systolic and diastolic CHF: Appears euvolemic on exam. Chest x-ray showed cardiomegaly. Continue volume management with hemodialysis.  Hypothyroidism: Continue levothyroxine .  Essential hypertension: Continue amlodipine . Continue metoprolol .  GERD: Continue pantoprazole .   DVT prophylaxis: Heparin  sq Code Status: Full code Family Communication: No family at bed side Disposition Plan:   Status is: Inpatient Remains inpatient appropriate because: Patient admitted for critical limb ischemia leading to necrotic TMA site.  Started on empiric antibiotics vascular and podiatry is planning intervention this admission. Patient is not medically ready for discharge yet.    Consultants:  Podiatry Vascular surgery Nephrology  Procedures:  Antimicrobials:  Anti-infectives (From admission, onward)    Start     Dose/Rate Route Frequency Ordered Stop   06/24/24 1200  vancomycin  (VANCOREADY) IVPB 750 mg/150 mL        750 mg 150 mL/hr over 60 Minutes Intravenous Every M-W-F (Hemodialysis) 06/23/24 2022     06/23/24 2030  metroNIDAZOLE  (FLAGYL ) IVPB 500 mg        500 mg 100 mL/hr over 60 Minutes Intravenous Every 12 hours 06/23/24 2018     06/23/24 2030  ceFEPIme  (MAXIPIME ) 1 g in sodium chloride  0.9 % 100 mL IVPB        1 g 200 mL/hr over 30 Minutes Intravenous Every 24 hours 06/23/24 2021     06/23/24 2030  vancomycin  (VANCOREADY) IVPB 1500 mg/300 mL        1,500 mg 150 mL/hr over 120 Minutes Intravenous  Once 06/23/24 2022 06/24/24 0152      Subjective: Patient was seen and examined at bedside.  Overnight events noted. Patient seems alert and oriented,  following commands.  States he is waiting for his  mother to make decision about his surgery. He has  dressing in both feet.  Objective: Vitals:   06/24/24 0800 06/24/24 0936 06/24/24 1100 06/24/24 1200  BP: (!) 133/51  (!) 150/79 (!) 149/76  Pulse: 71  (!) 57 61  Resp: 14  20 17   Temp:  98.2 F (36.8 C) 98 F (36.7 C)   TempSrc:  Axillary Oral   SpO2: 92%  95% 98%  Weight:      Height:        Intake/Output Summary (Last 24 hours) at 06/24/2024 1221 Last data filed at 06/24/2024 0931 Gross per 24 hour  Intake 198.63 ml  Output --  Net 198.63 ml   Filed Weights   06/23/24 1322  Weight: 74.8 kg    Examination:  General exam: Appears calm and comfortable, deconditioned, not in any acute distress. Respiratory system: CTA Bilaterally . Respiratory effort normal.  RR 14 Cardiovascular system: S1 & S2 heard, RRR. No JVD, murmurs, rubs, gallops or clicks.  Gastrointestinal system: Abdomen is non distended, soft and non tender. Normal bowel sounds heard. Central nervous system: Alert and oriented x 3. No focal neurological deficits. Extremities: Both feet with gangrenous changes without cellulitis and open wound. Skin: No rashes, lesions or ulcers Psychiatry: Judgement and insight appear normal. Mood & affect appropriate.     Data Reviewed: I have personally reviewed following labs and imaging studies  CBC: Recent Labs  Lab 06/23/24 1340 06/24/24 0545  WBC 10.9* 10.3  NEUTROABS 8.7* 8.3*  HGB 10.6* 9.5*  HCT 36.2* 31.9*  MCV 98.6 96.7  PLT 247 240   Basic Metabolic Panel: Recent Labs  Lab 06/23/24 1340 06/24/24 0545  NA 138 138  K 3.8 4.4  CL 99 100  CO2 25 23  GLUCOSE 236* 125*  BUN 25* 33*  CREATININE 5.51* 6.36*  CALCIUM  8.6* 8.5*  MG  --  2.0  PHOS  --  3.4   GFR: Estimated Creatinine Clearance: 12 mL/min (A) (by C-G formula based on SCr of 6.36 mg/dL (H)). Liver Function Tests: Recent Labs  Lab 06/24/24 0545  AST 18  ALT 9  ALKPHOS 131*  BILITOT 0.3  PROT 6.3*  ALBUMIN  2.9*   No results for input(s): LIPASE, AMYLASE in the last 168  hours. No results for input(s): AMMONIA in the last 168 hours. Coagulation Profile: No results for input(s): INR, PROTIME in the last 168 hours. Cardiac Enzymes: No results for input(s): CKTOTAL, CKMB, CKMBINDEX, TROPONINI in the last 168 hours. BNP (last 3 results) No results for input(s): PROBNP in the last 8760 hours. HbA1C: No results for input(s): HGBA1C in the last 72 hours. CBG: Recent Labs  Lab 06/23/24 1803  GLUCAP 154*   Lipid Profile: No results for input(s): CHOL, HDL, LDLCALC, TRIG, CHOLHDL, LDLDIRECT in the last 72 hours. Thyroid  Function Tests: No results for input(s): TSH, T4TOTAL, FREET4, T3FREE, THYROIDAB in the last 72 hours. Anemia Panel: No results for input(s): VITAMINB12, FOLATE, FERRITIN, TIBC, IRON, RETICCTPCT in the last 72 hours. Sepsis Labs: No results for input(s): PROCALCITON, LATICACIDVEN in the last 168 hours.  Recent Results (from the past 240 hours)  Blood culture (routine x 2)     Status: None (Preliminary result)   Collection Time: 06/23/24  4:48 PM   Specimen: BLOOD  Result Value Ref Range Status   Specimen Description BLOOD SITE NOT SPECIFIED  Final   Special Requests   Final    BOTTLES DRAWN AEROBIC AND ANAEROBIC Blood Culture results may not  be optimal due to an inadequate volume of blood received in culture bottles   Culture   Final    NO GROWTH < 24 HOURS Performed at [facility], [address] [place], [place] [address]    Report Status PENDING  Incomplete  Blood culture (routine x 2)     Status: None (Preliminary result)   Collection Time: 06/23/24  4:53 PM   Specimen: BLOOD  Result Value Ref Range Status   Specimen Description BLOOD SITE NOT SPECIFIED  Final   Special Requests   Final    BOTTLES DRAWN AEROBIC AND ANAEROBIC Blood Culture results may not be optimal due to an inadequate volume of blood received in culture bottles   Culture   Final    NO GROWTH < 24  HOURS Performed at [facility], [address], [place], [place] [address]    Report Status PENDING  Incomplete    Radiology Studies: MR FOOT RIGHT W WO CONTRAST Addendum Date: 06/23/2024 ADDENDUM REPORT: 06/23/2024 21:57 ADDENDUM: The postcontrast images were missing from the original series. These are now available for review. These demonstrate a large open at the metatarsal resection site with gas in the soft tissues creating the artifact. Nonenhancing soft tissue noted consistent with tissue necrosis. Enhancement in the remaining metatarsal bones consistent with osteomyelitis. Electronically Signed   By: MYRTIS Stammer M.D.   On: 06/23/2024 21:57   Result Date: 06/23/2024 CLINICAL DATA:  Recent transmetatarsal amputation of the right foot (04/26/2024). Pain, swelling and wound dehiscence with suspected gangrene. EXAM: MRI OF THE RIGHT FOREFOOT WITHOUT AND WITH CONTRAST TECHNIQUE: Multiplanar, multisequence MR imaging of the right foot was performed before and after the administration of intravenous contrast. CONTRAST:  7.5mL GADAVIST  GADOBUTROL  1 MMOL/ML IV SOLN COMPARISON:  Radiographs 04/26/2024 FINDINGS: Significant artifact noted at the amputation site possibly related to gas or skin staples. Abnormal T1 and T2 signal intensity in the remaining metatarsal shafts highly suspicious bone infarcts and osteomyelitis. No findings suspicious for septic arthritis at the tarsal metatarsal joints. No obvious discrete drainable fluid collection in the subcutaneous tissues. There is diffuse cellulitis and myofasciitis. The hindfoot bony structures are intact. No evidence of tibiotalar or subtalar septic arthritis. Edema like signal changes in the foot musculature likely a combination loss of tension/function from nonfunctioning tendons and myofasciitis. IMPRESSION: 1. Significant artifact noted at the amputation site possibly related to gas or skin staples. 2. Abnormal T1 and T2 signal intensity in the  remaining metatarsal shafts highly suspicious for bone infarcts and osteomyelitis. 3. No findings suspicious for septic arthritis at the tarsometatarsal joints. 4. Diffuse cellulitis and myofasciitis. Electronically Signed: By: MYRTIS Stammer M.D. On: 06/23/2024 17:51   MR FOOT LEFT W WO CONTRAST Result Date: 06/23/2024 CLINICAL DATA:  Status post mid metatarsal amputation with wound dehiscence and suspect gangrene. EXAM: MRI OF THE LEFT FOREFOOT WITHOUT AND WITH CONTRAST TECHNIQUE: Multiplanar, multisequence MR imaging of the left foot was performed both before and after administration of intravenous contrast. CONTRAST:  7.5mL GADAVIST  GADOBUTROL  1 MMOL/ML IV SOLN COMPARISON:  Radiographs 05/30/2024 FINDINGS: Open wounds noted along the mid metatarsal surgical site most notably medially overlying the residual first metatarsal. Diffuse nonenhancing soft tissue noted consistent with tissue necrosis/nonviable tissue. No rim enhancing abscess. Abnormal T1 and T2 signal intensity and subsequent contrast enhancement in the metatarsals consistent with osteomyelitis. No midfoot or hindfoot septic arthritis or osteomyelitis. Diffuse myofasciitis without definite findings for pyomyositis. IMPRESSION: 1. Open wounds along the mid  metatarsal surgical site most notably medially overlying the residual first metatarsal. Diffuse nonenhancing soft tissue noted consistent with tissue necrosis/nonviable tissue. No rim enhancing abscess. 2. Abnormal T1 and T2 signal intensity and subsequent contrast enhancement in the residual metatarsals consistent with osteomyelitis. 3. Diffuse myofasciitis without definite findings for pyomyositis. Electronically Signed   By: MYRTIS Stammer M.D.   On: 06/23/2024 18:15   DG Chest Portable 1 View Result Date: 06/23/2024 CLINICAL DATA:  Preoperative evaluation EXAM: PORTABLE CHEST 1 VIEW COMPARISON:  05/13/2024 FINDINGS: Single frontal view of the chest demonstrates right internal jugular dialysis  catheter. Cardiac silhouette is enlarged but stable. Mild chronic vascular congestion without airspace disease, effusion, or pneumothorax. No acute bony abnormality. IMPRESSION: 1. Stable enlargement of the cardiac silhouette. 2. Mild chronic pulmonary vascular congestion.  No overt edema. Electronically Signed   By: Ozell Daring M.D.   On: 06/23/2024 15:52   Scheduled Meds:  amitriptyline   10 mg Oral QHS   amLODipine   10 mg Oral q morning   atorvastatin   40 mg Oral Daily   calcium  acetate  667 mg Oral TID WC   Chlorhexidine  Gluconate Cloth  6 each Topical Q0600   heparin   5,000 Units Subcutaneous Q8H   levothyroxine   50 mcg Oral QAC breakfast   metoprolol  tartrate  50 mg Oral BID   pantoprazole   40 mg Oral Daily   Continuous Infusions:  sodium chloride  75 mL/hr at 06/24/24 0542   ceFEPime  (MAXIPIME ) IV Stopped (06/23/24 2135)   metronidazole  Stopped (06/24/24 0931)   vancomycin        LOS: 1 day    Time spent: 50 mins    Darcel Dawley, MD [facility]   If 7PM-7AM, please contact night-coverage  "

## 2024-06-24 NOTE — Plan of Care (Signed)
" °  Problem: Education: Goal: Ability to describe self-care measures that may prevent or decrease complications (Diabetes Survival Skills Education) will improve Outcome: Progressing   Problem: Coping: Goal: Ability to adjust to condition or change in health will improve Outcome: Progressing   Problem: Fluid Volume: Goal: Ability to maintain a balanced intake and output will improve Outcome: Progressing   Problem: Health Behavior/Discharge Planning: Goal: Ability to identify and utilize available resources and services will improve Outcome: Progressing Goal: Ability to manage health-related needs will improve Outcome: Progressing   Problem: Metabolic: Goal: Ability to maintain appropriate glucose levels will improve Outcome: Progressing   Problem: Nutritional: Goal: Maintenance of adequate nutrition will improve Outcome: Progressing Goal: Progress toward achieving an optimal weight will improve Outcome: Progressing   Problem: Skin Integrity: Goal: Risk for impaired skin integrity will decrease Outcome: Progressing   Problem: Tissue Perfusion: Goal: Adequacy of tissue perfusion will improve Outcome: Progressing   Problem: Education: Goal: Knowledge of General Education information will improve Description: Including pain rating scale, medication(s)/side effects and non-pharmacologic comfort measures Outcome: Progressing   Problem: Health Behavior/Discharge Planning: Goal: Ability to manage health-related needs will improve Outcome: Progressing   Problem: Clinical Measurements: Goal: Ability to maintain clinical measurements within normal limits will improve Outcome: Progressing Goal: Will remain free from infection Outcome: Progressing Goal: Diagnostic test results will improve Outcome: Progressing Goal: Respiratory complications will improve Outcome: Progressing Goal: Cardiovascular complication will be avoided Outcome: Progressing   Problem: Activity: Goal:  Risk for activity intolerance will decrease Outcome: Progressing   Problem: Nutrition: Goal: Adequate nutrition will be maintained Outcome: Progressing   Problem: Coping: Goal: Level of anxiety will decrease Outcome: Progressing   Problem: Elimination: Goal: Will not experience complications related to bowel motility Outcome: Progressing Goal: Will not experience complications related to urinary retention Outcome: Progressing   Problem: Pain Managment: Goal: General experience of comfort will improve and/or be controlled Outcome: Progressing   Problem: Safety: Goal: Ability to remain free from injury will improve Outcome: Progressing   Problem: Skin Integrity: Goal: Risk for impaired skin integrity will decrease Outcome: Not Progressing   "

## 2024-06-25 ENCOUNTER — Inpatient Hospital Stay (HOSPITAL_COMMUNITY)

## 2024-06-25 ENCOUNTER — Encounter (HOSPITAL_COMMUNITY): Payer: Self-pay | Admitting: Internal Medicine

## 2024-06-25 ENCOUNTER — Encounter (HOSPITAL_COMMUNITY)
Admission: EM | Disposition: A | Discharge status: Skilled Nursing Facility | Payer: Self-pay | Source: Home / Self Care | Attending: Family Medicine

## 2024-06-25 ENCOUNTER — Inpatient Hospital Stay (HOSPITAL_COMMUNITY): Payer: Self-pay | Admitting: Anesthesiology

## 2024-06-25 ENCOUNTER — Encounter (HOSPITAL_COMMUNITY): Payer: Self-pay | Admitting: Anesthesiology

## 2024-06-25 DIAGNOSIS — I120 Hypertensive chronic kidney disease with stage 5 chronic kidney disease or end stage renal disease: Secondary | ICD-10-CM

## 2024-06-25 DIAGNOSIS — I96 Gangrene, not elsewhere classified: Secondary | ICD-10-CM | POA: Diagnosis not present

## 2024-06-25 DIAGNOSIS — N186 End stage renal disease: Secondary | ICD-10-CM

## 2024-06-25 DIAGNOSIS — E1169 Type 2 diabetes mellitus with other specified complication: Secondary | ICD-10-CM | POA: Diagnosis not present

## 2024-06-25 DIAGNOSIS — M869 Osteomyelitis, unspecified: Secondary | ICD-10-CM | POA: Diagnosis not present

## 2024-06-25 HISTORY — PX: TRANSMETATARSAL AMPUTATION: SHX6197

## 2024-06-25 LAB — POCT I-STAT, CHEM 8
BUN: 29 mg/dL — ABNORMAL HIGH (ref 8–23)
Calcium, Ion: 0.96 mmol/L — ABNORMAL LOW (ref 1.15–1.40)
Chloride: 99 mmol/L (ref 98–111)
Creatinine, Ser: 4.5 mg/dL — ABNORMAL HIGH (ref 0.61–1.24)
Glucose, Bld: 95 mg/dL (ref 70–99)
HCT: 36 % — ABNORMAL LOW (ref 39.0–52.0)
Hemoglobin: 12.2 g/dL — ABNORMAL LOW (ref 13.0–17.0)
Potassium: 5.1 mmol/L (ref 3.5–5.1)
Sodium: 133 mmol/L — ABNORMAL LOW (ref 135–145)
TCO2: 26 mmol/L (ref 22–32)

## 2024-06-25 LAB — GLUCOSE, CAPILLARY
Glucose-Capillary: 87 mg/dL (ref 70–99)
Glucose-Capillary: 94 mg/dL (ref 70–99)

## 2024-06-25 LAB — SURGICAL PCR SCREEN
MRSA, PCR: POSITIVE — AB
Staphylococcus aureus: POSITIVE — AB

## 2024-06-25 LAB — HEPATITIS B SURFACE ANTIBODY, QUANTITATIVE: Hep B S AB Quant (Post): 3097 m[IU]/mL

## 2024-06-25 MED ORDER — FENTANYL CITRATE (PF) 100 MCG/2ML IJ SOLN
INTRAMUSCULAR | Status: AC
  Administered 2024-06-25: 50 ug via INTRAVENOUS
  Filled 2024-06-25: qty 2

## 2024-06-25 MED ORDER — MUPIROCIN 2 % EX OINT
1.0000 | TOPICAL_OINTMENT | Freq: Two times a day (BID) | CUTANEOUS | Status: AC
  Administered 2024-06-25 – 2024-06-29 (×7): 1 via NASAL
  Filled 2024-06-25 (×3): qty 22

## 2024-06-25 MED ORDER — FENTANYL CITRATE (PF) 100 MCG/2ML IJ SOLN: 25.0000 ug | INTRAMUSCULAR | Status: DC | PRN

## 2024-06-25 MED ORDER — SODIUM CHLORIDE 0.9 % IR SOLN
Status: DC | PRN
  Administered 2024-06-25: 1000 mL

## 2024-06-25 MED ORDER — JUVEN PO PACK
1.0000 | PACK | Freq: Two times a day (BID) | ORAL | Status: DC
  Administered 2024-06-27 – 2024-07-07 (×11): 1 via ORAL
  Filled 2024-06-25 (×12): qty 1

## 2024-06-25 MED ORDER — ACETAMINOPHEN 500 MG PO TABS: 1000.0000 mg | ORAL_TABLET | Freq: Once | ORAL | Status: AC

## 2024-06-25 MED ORDER — CHLORHEXIDINE GLUCONATE CLOTH 2 % EX PADS
6.0000 | MEDICATED_PAD | Freq: Every day | CUTANEOUS | Status: AC
  Administered 2024-06-27 – 2024-06-29 (×3): 6 via TOPICAL

## 2024-06-25 MED ORDER — OXYCODONE HCL 5 MG PO TABS: 5.0000 mg | ORAL_TABLET | Freq: Once | ORAL | Status: DC | PRN

## 2024-06-25 MED ORDER — MIDAZOLAM HCL (PF) 2 MG/2ML IJ SOLN: 1.0000 mg | Freq: Once | INTRAMUSCULAR | Status: AC

## 2024-06-25 MED ORDER — PROPOFOL 500 MG/50ML IV EMUL
INTRAVENOUS | Status: DC | PRN
  Administered 2024-06-25: 50 ug/kg/min via INTRAVENOUS

## 2024-06-25 MED ORDER — ROPIVACAINE HCL 5 MG/ML IJ SOLN
INTRAMUSCULAR | Status: DC | PRN
  Administered 2024-06-25: 30 mL via PERINEURAL

## 2024-06-25 MED ORDER — RENA-VITE PO TABS
1.0000 | ORAL_TABLET | Freq: Every day | ORAL | Status: DC
  Administered 2024-06-26 – 2024-07-06 (×10): 1 via ORAL
  Filled 2024-06-25 (×13): qty 1

## 2024-06-25 MED ORDER — MIDAZOLAM HCL 2 MG/2ML IJ SOLN
INTRAMUSCULAR | Status: AC
  Administered 2024-06-25: 1 mg via INTRAVENOUS
  Filled 2024-06-25: qty 2

## 2024-06-25 MED ORDER — DEXAMETHASONE SOD PHOSPHATE PF 10 MG/ML IJ SOLN
INTRAMUSCULAR | Status: DC | PRN
  Administered 2024-06-25: 10 mg via PERINEURAL

## 2024-06-25 MED ORDER — PROSOURCE PLUS PO LIQD
30.0000 mL | Freq: Two times a day (BID) | ORAL | Status: DC
  Administered 2024-06-25 – 2024-07-07 (×11): 30 mL via ORAL
  Filled 2024-06-25 (×15): qty 30

## 2024-06-25 MED ORDER — ACETAMINOPHEN 500 MG PO TABS
ORAL_TABLET | ORAL | Status: AC
  Administered 2024-06-25: 1000 mg via ORAL
  Filled 2024-06-25: qty 2

## 2024-06-25 MED ORDER — ACETAMINOPHEN 500 MG PO TABS
ORAL_TABLET | ORAL | Status: AC
  Filled 2024-06-25: qty 1

## 2024-06-25 MED ORDER — 0.9 % SODIUM CHLORIDE (POUR BTL) OPTIME
TOPICAL | Status: DC | PRN
  Administered 2024-06-25: 1000 mL

## 2024-06-25 MED ORDER — CHLORHEXIDINE GLUCONATE 0.12 % MT SOLN: 15.0000 mL | Freq: Once | OROMUCOSAL | Status: AC

## 2024-06-25 MED ORDER — INSULIN ASPART 100 UNIT/ML IJ SOLN: 0.0000 [IU] | INTRAMUSCULAR | Status: DC | PRN

## 2024-06-25 MED ORDER — AMISULPRIDE (ANTIEMETIC) 5 MG/2ML IV SOLN: 10.0000 mg | Freq: Once | INTRAVENOUS | Status: DC | PRN

## 2024-06-25 MED ORDER — LIDOCAINE HCL (PF) 1 % IJ SOLN
INTRAMUSCULAR | Status: AC
  Filled 2024-06-25: qty 10

## 2024-06-25 MED ORDER — SODIUM CHLORIDE 0.9 % IV SOLN: INTRAVENOUS | Status: DC

## 2024-06-25 MED ORDER — OXYCODONE HCL 5 MG/5ML PO SOLN: 5.0000 mg | Freq: Once | ORAL | Status: DC | PRN

## 2024-06-25 MED ORDER — FENTANYL CITRATE (PF) 100 MCG/2ML IJ SOLN: 50.0000 ug | Freq: Once | INTRAMUSCULAR | Status: AC

## 2024-06-25 MED ORDER — CHLORHEXIDINE GLUCONATE 0.12 % MT SOLN
OROMUCOSAL | Status: AC
  Administered 2024-06-25: 15 mL via OROMUCOSAL
  Filled 2024-06-25: qty 15

## 2024-06-25 MED ORDER — THIAMINE MONONITRATE 100 MG PO TABS
100.0000 mg | ORAL_TABLET | Freq: Every day | ORAL | Status: DC
  Administered 2024-06-27 – 2024-07-07 (×10): 100 mg via ORAL
  Filled 2024-06-25 (×13): qty 1

## 2024-06-25 MED ORDER — ORAL CARE MOUTH RINSE: 15.0000 mL | Freq: Once | OROMUCOSAL | Status: AC

## 2024-06-25 MED ORDER — BUPIVACAINE HCL (PF) 0.5 % IJ SOLN
INTRAMUSCULAR | Status: AC
  Filled 2024-06-25: qty 10

## 2024-06-25 NOTE — Progress Notes (Signed)
 History and Physical Interval Note:  06/25/2024 9:33 AM  [name]  has presented today for surgery, with the diagnosis of gangrene and osteomyelitis of left foot TMA site.  The various methods of treatment have been discussed with the patient and family. After consideration of risks, benefits and other options for treatment, the patient has consented to   Procedures with comments: AMPUTATION, FOOT, TRANSMETATARSAL (Left) - Left foot revision TMA to lisfranc amp as a surgical intervention.  The patient's history has been reviewed, patient examined, no change in status, stable for surgery.  I have reviewed the patient's chart and labs.  Questions were answered to the patient's satisfaction.     Marsa FALCON Andren Bethea

## 2024-06-25 NOTE — Op Note (Addendum)
 Full Operative Report  Date of Operation: 9:44 AM, 06/25/2024   Patient: [name] - 64 y.o. male  Surgeon: Malvin Marsa FALCON, DPM   Assistant: None  Diagnosis: Osteomyelitis, gangrene left foot  Procedure:  1. Revision left foot transmetatarsal to lisfranc amputation    Anesthesia: Monitor Anesthesia Care  No responsible provider has been recorded for the case.  No anesthesia staff entered.   Estimated Blood Loss: 20 mL  Hemostasis: 1) Anatomical dissection, mechanical compression, electrocautery 2) No tourinquet was used  Implants: * No implants in log *  Materials: prolene 2-0 and skin staples  Injectables: 1) Pre-operatively: Pre op block per anesthesia  2) Post-operatively: None   Specimens: - Pathology: Left forefoot tissue and bone - Microbiology: tissue culture left forefoot, abscess swab culture lateral forefoot aerobic/anaerobic    Antibiotics: IV antibiotics given per schedule on the floor  Drains: None  Complications: Patient tolerated the procedure well without complication.   Operative findings: As below in detailed report  Indications for Procedure: DELMA VILLALVA presents to [facility], [place] with a chief complaint of chronic non healing wounds along LEFT foot TMA site with concern for gangrene/necrosis as well as osteomyelitis.  The patient has failed conservative treatments of various modalities. At this time the patient has elected to proceed with surgical correction. All alternatives, risks, and complications of the procedures were thoroughly explained to the patient. Patient exhibits appropriate understanding of all discussion points and informed consent was signed and obtained in the chart with no guarantees to surgical outcome given or implied.  Description of Procedure: Patient was brought to the operating room. Patient remained on their hospital bed in the supine position. A surgical timeout was performed and all  members of the operating room, the procedure, and the surgical site were identified. anesthesia occurred as per anesthesia record. Local anesthetic as previously described was then injected about the operative field in a local infiltrative block.  The operative lower extremity as noted above was then prepped and draped in the usual sterile manner. The following procedure then began.  Attention was directed to the LEFT lower extremity. A fish-mouth type incision was made proximal to the web spaces and encompassed the entire forefoot. The full-thickness incision was made with a longer plantar flap to allow for wound closure. The incision was continued through the soft tissue down to the shafts of the metatarsal bones. A 15 blade was then used to free up the periosteum on all the metatarsal shafts.  A deep tissue culture was harvested with rongeur from the medial aspect of the ulceration with fibro necrotic material present in the wound bed.  There is noted to be a small abscess collection at the lateral forefoot distal to the resection margin swab culture was performed aerobic anaerobic culture sent.  Using an oscillating saw, the metatarsals were cut in a dorsal distal to plantar proximal orientation. The first metatarsal was beveled so that the medial cortex was shorter than the lateral, and the fifth metatarsal was beveled so that the lateral cortex was shorter than the medial, thus less prominent.  Further resection was necessary especially medially into the medial cuneiform and partially in  the intermediate cuneiform due to soft tissue loss from the resection.  The amputation was done so that a rough osseous parabola was maintained.  The 2nd through 5th metatarsals were disarticulated at the tarsometatarsal joint.   The distal soft tissue and osseous specimen was passed off the field and sent  for gross pathology. All remaining non-viable and necrotic tissues were sharply resected and removed. Extensor and  flexors tendons were grasped with a hemostat and cut proximally.  Appeared to have adequate bleeding though no pulsatile bleeding at the dorsal and plantar flap. The surgical site was then flushed with 1000ml of saline under power pulse lavage. The plantar flap was brought in approximation with the dorsal flap and the sutures material previously described was used for closure. Care was taken not to place the flaps under tension in order not to jeopardize the vascular supply.  The surgical site was then dressed with Xeroform 4 x 4 gauze ABD pad Kerlix Ace. The patient tolerated both the procedure and anesthesia well with vital signs stable throughout. The patient was transferred in good condition and all vital signs stable  from the OR to recovery under the discretion of anesthesia.  Condition: Vital signs stable, neurovascular status unchanged from preoperative   Surgical plan:  Expect clean surgical margin disarticulation of the 2nd through 4th metatarsals at the TMT J with slight resection of the medial cuneiform medially.  Appear to have adequate bleeding for healing.  Strict nonweightbearing left lower extremity.  In regards to antibiotics okay to transition to p.o. x 7 days postop.  The patient will be nonweightbearing in a soft dressing and postop shoe when working with PT does not need to wear in bed to the operative limb until further instructed. The dressing is to remain clean, dry, and intact. Will continue to follow unless noted elsewhere.   Marsa Honour, DPM [facility]

## 2024-06-25 NOTE — Anesthesia Preprocedure Evaluation (Addendum)
"                                    Anesthesia Evaluation  Patient identified by MRN, date of birth, ID band Patient awake    Reviewed: Allergy & Precautions, NPO status , Patient's Chart, lab work & pertinent test results, reviewed documented beta blocker date and time   Airway Mallampati: III  TM Distance: >3 FB Neck ROM: Full    Dental  (+) Chipped, Poor Dentition, Dental Advisory Given, Missing,    Pulmonary neg pulmonary ROS   Pulmonary exam normal breath sounds clear to auscultation       Cardiovascular hypertension, Pt. on medications and Pt. on home beta blockers + Peripheral Vascular Disease (plavix ) and +CHF (LVEF 50-55%, grade 2 diastolic dysfunction)  Normal cardiovascular exam Rhythm:Regular Rate:Normal  Echo 06/29/23:  1. Left ventricular ejection fraction, by estimation, is 50 to 55%. The  left ventricle has low normal function. The left ventricle has no regional  wall motion abnormalities. There is mild left ventricular hypertrophy.  Left ventricular diastolic  parameters are consistent with Grade II diastolic dysfunction  (pseudonormalization). Elevated left atrial pressure. The average left  ventricular global longitudinal strain is -19.2 %.   2. Right ventricular systolic function is normal. The right ventricular  size is normal. Tricuspid regurgitation signal is inadequate for assessing  PA pressure.   3. Left atrial size was mildly dilated.   4. The mitral valve is abnormal. Moderate leaflet thickening. Trivial  mitral valve regurgitation.   5. The aortic valve is tricuspid. Aortic valve regurgitation is not  visualized. Aortic valve sclerosis/calcification is present, without any  evidence of aortic stenosis.   6. The inferior vena cava is normal in size with greater than 50%  respiratory variability, suggesting right atrial pressure of 3 mmHg.     Neuro/Psych negative neurological ROS  negative psych ROS   GI/Hepatic Neg liver ROS,GERD   Medicated and Controlled,,  Endo/Other  diabetes, Well Controlled, Type 2Hypothyroidism  Last A1c 4.6 (04/2024)  Renal/GU ESRF and DialysisRenal diseaseLast HD yesterday   negative genitourinary   Musculoskeletal  (+) Arthritis , Osteoarthritis,  Osteo/gangrene L foot in the setting of B/L LE nonhealing wounds (s/p R TMA 04/2024)   Abdominal   Peds  Hematology  (+) Blood dyscrasia, anemia Hb 9.5, plt 240   Anesthesia Other Findings   Reproductive/Obstetrics negative OB ROS                              Anesthesia Physical Anesthesia Plan  ASA: 3  Anesthesia Plan: MAC and Regional   Post-op Pain Management: Regional block* and Tylenol  PO (pre-op )*   Induction:   PONV Risk Score and Plan: 2 and Propofol  infusion, TIVA, Midazolam  and Ondansetron   Airway Management Planned: Natural Airway and Simple Face Mask  Additional Equipment: None  Intra-op Plan:   Post-operative Plan:   Informed Consent: I have reviewed the patients History and Physical, chart, labs and discussed the procedure including the risks, benefits and alternatives for the proposed anesthesia with the patient or authorized representative who has indicated his/her understanding and acceptance.     Dental advisory given  Plan Discussed with: CRNA and Anesthesiologist  Anesthesia Plan Comments:          Anesthesia Quick Evaluation  "

## 2024-06-25 NOTE — Plan of Care (Signed)
  Problem: Coping: Goal: Ability to adjust to condition or change in health will improve Outcome: Progressing   Problem: Health Behavior/Discharge Planning: Goal: Ability to identify and utilize available resources and services will improve Outcome: Progressing   Problem: Skin Integrity: Goal: Risk for impaired skin integrity will decrease Outcome: Progressing

## 2024-06-25 NOTE — Anesthesia Procedure Notes (Signed)
 Anesthesia Regional Block: Popliteal block   Pre-Anesthetic Checklist: , timeout performed,  Correct Patient, Correct Site, Correct Laterality,  Correct Procedure, Correct Position, site marked,  Risks and benefits discussed,  Pre-op  evaluation,  At surgeon's request and post-op pain management  Laterality: Left  Prep: Maximum Sterile Barrier Precautions used, chloraprep       Needles:  Injection technique: Single-shot  Needle Type: Echogenic Stimulator Needle     Needle Length: 9cm  Needle Gauge: 21     Additional Needles:   Procedures:,,,, ultrasound used (permanent image in chart),,    Narrative:  Start time: 06/25/2024 9:29 AM End time: 06/25/2024 9:32 AM Injection made incrementally with aspirations every 5 mL. Anesthesiologist: Niels Marien CROME, MD

## 2024-06-25 NOTE — Progress Notes (Addendum)
" °  Progress Note    06/25/2024 6:58 AM Hospital Day 2  Subjective:  resting in bed.  After discussing he would be going to OR tomorrow with Dr. Lanis and today with Dr. Malvin, he said I thought I was going today  afebrile  Vitals:   06/24/24 2053 06/25/24 0442  BP: (!) 169/106 (!) 157/107  Pulse: 80 66  Resp: 19 19  Temp: 97.6 F (36.4 C) 98 F (36.7 C)  SpO2: 97% 98%    Physical Exam: General:  no distress Lungs:  non labored Extremities:  bandages to bilateral feet  CBC    Component Value Date/Time   WBC 10.3 06/24/2024 0545   RBC 3.30 (L) 06/24/2024 0545   HGB 9.5 (L) 06/24/2024 0545   HCT 31.9 (L) 06/24/2024 0545   PLT 240 06/24/2024 0545   MCV 96.7 06/24/2024 0545   MCH 28.8 06/24/2024 0545   MCHC 29.8 (L) 06/24/2024 0545   RDW 15.3 06/24/2024 0545   LYMPHSABS 1.0 06/24/2024 0545   MONOABS 0.4 06/24/2024 0545   EOSABS 0.4 06/24/2024 0545   BASOSABS 0.1 06/24/2024 0545    BMET    Component Value Date/Time   NA 138 06/24/2024 0545   K 4.4 06/24/2024 0545   CL 100 06/24/2024 0545   CO2 23 06/24/2024 0545   GLUCOSE 125 (H) 06/24/2024 0545   BUN 33 (H) 06/24/2024 0545   CREATININE 6.36 (H) 06/24/2024 0545   CALCIUM  8.5 (L) 06/24/2024 0545   GFRNONAA 9 (L) 06/24/2024 0545   GFRAA 48 (L) 01/31/2020 1216    INR    Component Value Date/Time   INR 1.2 05/13/2024 1600     Intake/Output Summary (Last 24 hours) at 06/25/2024 0658 Last data filed at 06/25/2024 0441 Gross per 24 hour  Intake 2082.75 ml  Output 3200 ml  Net -1117.25 ml     Assessment/Plan:  64 y.o. male with Bilateral lower extremity critical limb ischemia with tissue loss   Hospital Day 2  -plan for right BKA tomorrow with Dr. Lanis.  Reiterated to pt that he would be going to OR today with Dr. Malvin and tomorrow with Dr. Lanis.   -he is going today for revision of left TMA with Dr. Malvin.  -pre op orders for tomorrow have been placed   Lucie Apt,  PA-C [facility] [phone] 06/25/2024 6:58 AM   VASCULAR STAFF ADDENDUM: I have independently interviewed and examined the patient. I agree with the above.  I met with [name] and his mom prior to TMA surgery.  At the time of her last visit, we discussed separating his operations, and to 2 operations, 1 being for the left TMA, 1 being for the right below-knee amputation.  I offered to do the operations together, however [name] to keep them separate.  I discussed this with anesthesia, to make sure that they were comfortable with the above.  They preferred separating the operations as it would allow them to utilize more anesthetic for local block ensuring proper anesthesia.  Plan will be for right sided below-knee amputation with Dr. Gretta tomorrow.`   Fonda FORBES Lanis MD [facility] Phone Number: [phone] 06/25/2024 11:14 AM   "

## 2024-06-25 NOTE — Progress Notes (Signed)
 " PROGRESS NOTE    [name] [name]  FMW:[id number] DOB: Jul 28, 1960 DOA: 06/23/2024  PCP: Kip Righter, MD   Brief Narrative:  This 64 yrs old Male with PMH significant for poorly controlled type 2 diabetes mellitus, End-stage renal disease on hemodialysis (M/W/F schedule), severe peripheral arterial disease, hyperlipidemia, hypothyroidism, and cardiomyopathy who presents from podiatry clinic with bilateral foot gangrene and nonhealing wounds.  Patient underwent left foot ray amputation on 03/25/24 and Right TMA on 04/26/24.  Despite these intervention and multiple prior revascularization procedures he now presents with dry gangrene/necrotic eschar at the right TMA site and nonhealing wounds at the left TMA site. His vascular history is extensive, including multiple lower extremity angiograms with interventions (01/30/2024, 03/20/2024, 04/16/2024) including peripheral atherectomy, intravascular lithotripsy, and angioplasty. He has bilateral AV grafts for dialysis access (right arm 08/17/2023, left arm 12/27/2022) and currently has a right internal jugular dialysis catheter.  MRI left foot is ordered.  Patient was admitted for further evaluation.  Vascular surgery and podiatry is consulted.  Assessment & Plan:   Principal Problem:   Gangrene of foot (HCC)  Dry gangrene / Necrotic eschar Right TMA site; Left foot nonhealing post TMA : Bilateral Osteomyelitis: Severe PAD / Limb ischemia: Continue empiric antibiotics (Vancomycin , cefepime , Flagyl ) MRI Right Foot > highly suspicious for bone infarcts and osteomyelitis.  Diffuse cellulitis and myofasciitis. MRI left foot > Open wounds along mid metatarsal surgical site, soft tissue inflammation consistent with tissue necrosis,  nonviable tissue.  Osteomyelitis. Patient underwent revision left foot transmetatarsal to lisfranc amputation.  Vascular surgery planning right-sided BKA on Wednesday. Nonweightbearing bilateral lower extremity for  now.  End-stage renal disease on hemodialysis: Nephrology is consulted for dialysis coordination. Continue volume status, monitor electrolytes. Plan for hemodialysis tomorrow , will coordinate around surgical schedule.  Type 2 diabetes: Hold p.o. diabetic medications Continue regular insulin  sliding scale  Chronic combined systolic and diastolic CHF: Appears euvolemic on exam. Chest x-ray showed cardiomegaly. Continue volume management with hemodialysis.  Hypothyroidism: Continue levothyroxine .  Essential hypertension: Continue amlodipine . Continue metoprolol .  GERD: Continue pantoprazole .   DVT prophylaxis: Heparin  sq Code Status: Full code Family Communication: No family at bed side Disposition Plan:   Status is: Inpatient Remains inpatient appropriate because: Patient admitted for critical limb ischemia leading to necrotic TMA site.  Started on empiric antibiotics , vascular and podiatry is planning intervention this admission. Patient is not medically ready for discharge yet.    Consultants:  Podiatry Vascular surgery Nephrology  Procedures:  Antimicrobials:  Anti-infectives (From admission, onward)    Start     Dose/Rate Route Frequency Ordered Stop   06/24/24 1200  vancomycin  (VANCOREADY) IVPB 750 mg/150 mL        750 mg 150 mL/hr over 60 Minutes Intravenous Every M-W-F (Hemodialysis) 06/23/24 2022     06/23/24 2030  metroNIDAZOLE  (FLAGYL ) IVPB 500 mg        500 mg 100 mL/hr over 60 Minutes Intravenous Every 12 hours 06/23/24 2018     06/23/24 2030  ceFEPIme  (MAXIPIME ) 1 g in sodium chloride  0.9 % 100 mL IVPB        1 g 200 mL/hr over 30 Minutes Intravenous Every 24 hours 06/23/24 2021     06/23/24 2030  vancomycin  (VANCOREADY) IVPB 1500 mg/300 mL        1,500 mg 150 mL/hr over 120 Minutes Intravenous  Once 06/23/24 2022 06/24/24 0152      Subjective: Patient was seen and examined at bedside.Overnight events noted. Patient seems  alert and oriented,   following commands.  He underwent revision left foot transmetatarsal to lisfranc amputation.  Objective: Vitals:   06/25/24 1040 06/25/24 1045 06/25/24 1100 06/25/24 1128  BP: 131/71 135/78 (!) 151/82 (!) (P) 152/73  Pulse: 62 64 63 (P) 62  Resp: 16 15 12  (P) 16  Temp: 98 F (36.7 C)  98 F (36.7 C) (!) (P) 97.5 F (36.4 C)  TempSrc:    (P) Oral  SpO2: 96% 94% 93% (P) 96%  Weight:      Height:        Intake/Output Summary (Last 24 hours) at 06/25/2024 1318 Last data filed at 06/25/2024 1035 Gross per 24 hour  Intake 1844.12 ml  Output 3210 ml  Net -1365.88 ml   Filed Weights   06/24/24 1508 06/24/24 1902 06/25/24 0840  Weight: 69.1 kg 66.2 kg 66 kg    Examination:  General exam: Appears calm and comfortable, deconditioned, not in any acute distress. Respiratory system: CTA Bilaterally. Respiratory effort normal.  RR 14 Cardiovascular system: S1 & S2 heard, RRR. No JVD, murmurs, rubs, gallops or clicks.  Gastrointestinal system: Abdomen is non distended, soft and non tender. Normal bowel sounds heard. Central nervous system: Alert and oriented x 3. No focal neurological deficits. Extremities: Both feet with gangrenous changes without cellulitis and open wound. Skin: No rashes, lesions or ulcers Psychiatry: Judgement and insight appear normal. Mood & affect appropriate.     Data Reviewed: I have personally reviewed following labs and imaging studies  CBC: Recent Labs  Lab 06/23/24 1340 06/24/24 0545 06/25/24 0904  WBC 10.9* 10.3  --   NEUTROABS 8.7* 8.3*  --   HGB 10.6* 9.5* 12.2*  HCT 36.2* 31.9* 36.0*  MCV 98.6 96.7  --   PLT 247 240  --    Basic Metabolic Panel: Recent Labs  Lab 06/23/24 1340 06/24/24 0545 06/25/24 0904  NA 138 138 133*  K 3.8 4.4 5.1  CL 99 100 99  CO2 25 23  --   GLUCOSE 236* 125* 95  BUN 25* 33* 29*  CREATININE 5.51* 6.36* 4.50*  CALCIUM  8.6* 8.5*  --   MG  --  2.0  --   PHOS  --  3.4  --    GFR: Estimated Creatinine  Clearance: 15.3 mL/min (A) (by C-G formula based on SCr of 4.5 mg/dL (H)). Liver Function Tests: Recent Labs  Lab 06/24/24 0545  AST 18  ALT 9  ALKPHOS 131*  BILITOT 0.3  PROT 6.3*  ALBUMIN  2.9*   No results for input(s): LIPASE, AMYLASE in the last 168 hours. No results for input(s): AMMONIA in the last 168 hours. Coagulation Profile: No results for input(s): INR, PROTIME in the last 168 hours. Cardiac Enzymes: No results for input(s): CKTOTAL, CKMB, CKMBINDEX, TROPONINI in the last 168 hours. BNP (last 3 results) No results for input(s): PROBNP in the last 8760 hours. HbA1C: No results for input(s): HGBA1C in the last 72 hours. CBG: Recent Labs  Lab 06/23/24 1803 06/25/24 0843 06/25/24 1042  GLUCAP 154* 87 94   Lipid Profile: No results for input(s): CHOL, HDL, LDLCALC, TRIG, CHOLHDL, LDLDIRECT in the last 72 hours. Thyroid  Function Tests: No results for input(s): TSH, T4TOTAL, FREET4, T3FREE, THYROIDAB in the last 72 hours. Anemia Panel: No results for input(s): VITAMINB12, FOLATE, FERRITIN, TIBC, IRON, RETICCTPCT in the last 72 hours. Sepsis Labs: No results for input(s): PROCALCITON, LATICACIDVEN in the last 168 hours.  Recent Results (from the past 240 hours)  Blood culture (routine x 2)     Status: None (Preliminary result)   Collection Time: 06/23/24  4:48 PM   Specimen: BLOOD  Result Value Ref Range Status   Specimen Description BLOOD SITE NOT SPECIFIED  Final   Special Requests   Final    BOTTLES DRAWN AEROBIC AND ANAEROBIC Blood Culture results may not be optimal due to an inadequate volume of blood received in culture bottles   Culture   Final    NO GROWTH 2 DAYS Performed at [facility], [address], [place], [place] [address]    Report Status PENDING  Incomplete  Blood culture (routine x 2)     Status: None (Preliminary result)   Collection Time: 06/23/24  4:53 PM   Specimen:  BLOOD  Result Value Ref Range Status   Specimen Description BLOOD SITE NOT SPECIFIED  Final   Special Requests   Final    BOTTLES DRAWN AEROBIC AND ANAEROBIC Blood Culture results may not be optimal due to an inadequate volume of blood received in culture bottles   Culture   Final    NO GROWTH 2 DAYS Performed at [facility], [address] [place], [place] [address]    Report Status PENDING  Incomplete  Surgical pcr screen     Status: Abnormal   Collection Time: 06/25/24  2:26 AM   Specimen: Nasal Mucosa; Nasal Swab  Result Value Ref Range Status   MRSA, PCR POSITIVE (A) NEGATIVE Final   Staphylococcus aureus POSITIVE (A) NEGATIVE Final    Comment: RESULT CALLED TO, READ BACK BY AND VERIFIED WITH: A LYNPHACUM RN 06/25/2024 @ 0414 BY AB (NOTE) The Xpert SA Assay (FDA approved for NASAL specimens in patients 38 years of age and older), is one component of a comprehensive surveillance program. It is not intended to diagnose infection nor to guide or monitor treatment. Performed at [facility], [address] [place], [place] [address]   Aerobic/Anaerobic Culture w Gram Stain (surgical/deep wound)     Status: None (Preliminary result)   Collection Time: 06/25/24 10:20 AM   Specimen: PATH Soft tissue  Result Value Ref Range Status   Specimen Description TISSUE  Final   Special Requests   Final    LEFT FOOT TISSUE Performed at [facility], [address], [place], [place] [address]    Gram Stain PENDING  Incomplete   Culture PENDING  Incomplete   Report Status PENDING  Incomplete    Radiology Studies: MR FOOT RIGHT W WO CONTRAST Addendum Date: 06/23/2024 ADDENDUM REPORT: 06/23/2024 21:57 ADDENDUM: The postcontrast images were missing from the original series. These are now available for review. These demonstrate a large open at the metatarsal resection site with gas in the soft tissues creating the artifact. Nonenhancing soft tissue noted consistent with  tissue necrosis. Enhancement in the remaining metatarsal bones consistent with osteomyelitis. Electronically Signed   By: MYRTIS Stammer M.D.   On: 06/23/2024 21:57   Result Date: 06/23/2024 CLINICAL DATA:  Recent transmetatarsal amputation of the right foot (04/26/2024). Pain, swelling and wound dehiscence with suspected gangrene. EXAM: MRI OF THE RIGHT FOREFOOT WITHOUT AND WITH CONTRAST TECHNIQUE: Multiplanar, multisequence MR imaging of the right foot was performed before and after the administration of intravenous contrast. CONTRAST:  7.5mL GADAVIST  GADOBUTROL  1 MMOL/ML IV SOLN COMPARISON:  Radiographs 04/26/2024 FINDINGS: Significant artifact noted at the amputation site possibly related to gas or skin staples. Abnormal T1 and T2 signal intensity  in the remaining metatarsal shafts highly suspicious bone infarcts and osteomyelitis. No findings suspicious for septic arthritis at the tarsal metatarsal joints. No obvious discrete drainable fluid collection in the subcutaneous tissues. There is diffuse cellulitis and myofasciitis. The hindfoot bony structures are intact. No evidence of tibiotalar or subtalar septic arthritis. Edema like signal changes in the foot musculature likely a combination loss of tension/function from nonfunctioning tendons and myofasciitis. IMPRESSION: 1. Significant artifact noted at the amputation site possibly related to gas or skin staples. 2. Abnormal T1 and T2 signal intensity in the remaining metatarsal shafts highly suspicious for bone infarcts and osteomyelitis. 3. No findings suspicious for septic arthritis at the tarsometatarsal joints. 4. Diffuse cellulitis and myofasciitis. Electronically Signed: By: MYRTIS Stammer M.D. On: 06/23/2024 17:51   MR FOOT LEFT W WO CONTRAST Result Date: 06/23/2024 CLINICAL DATA:  Status post mid metatarsal amputation with wound dehiscence and suspect gangrene. EXAM: MRI OF THE LEFT FOREFOOT WITHOUT AND WITH CONTRAST TECHNIQUE: Multiplanar,  multisequence MR imaging of the left foot was performed both before and after administration of intravenous contrast. CONTRAST:  7.5mL GADAVIST  GADOBUTROL  1 MMOL/ML IV SOLN COMPARISON:  Radiographs 05/30/2024 FINDINGS: Open wounds noted along the mid metatarsal surgical site most notably medially overlying the residual first metatarsal. Diffuse nonenhancing soft tissue noted consistent with tissue necrosis/nonviable tissue. No rim enhancing abscess. Abnormal T1 and T2 signal intensity and subsequent contrast enhancement in the metatarsals consistent with osteomyelitis. No midfoot or hindfoot septic arthritis or osteomyelitis. Diffuse myofasciitis without definite findings for pyomyositis. IMPRESSION: 1. Open wounds along the mid metatarsal surgical site most notably medially overlying the residual first metatarsal. Diffuse nonenhancing soft tissue noted consistent with tissue necrosis/nonviable tissue. No rim enhancing abscess. 2. Abnormal T1 and T2 signal intensity and subsequent contrast enhancement in the residual metatarsals consistent with osteomyelitis. 3. Diffuse myofasciitis without definite findings for pyomyositis. Electronically Signed   By: MYRTIS Stammer M.D.   On: 06/23/2024 18:15   DG Chest Portable 1 View Result Date: 06/23/2024 CLINICAL DATA:  Preoperative evaluation EXAM: PORTABLE CHEST 1 VIEW COMPARISON:  05/13/2024 FINDINGS: Single frontal view of the chest demonstrates right internal jugular dialysis catheter. Cardiac silhouette is enlarged but stable. Mild chronic vascular congestion without airspace disease, effusion, or pneumothorax. No acute bony abnormality. IMPRESSION: 1. Stable enlargement of the cardiac silhouette. 2. Mild chronic pulmonary vascular congestion.  No overt edema. Electronically Signed   By: Ozell Daring M.D.   On: 06/23/2024 15:52   Scheduled Meds:  acetaminophen        amitriptyline   10 mg Oral QHS   amLODipine   10 mg Oral q morning   atorvastatin   40 mg Oral  Daily   calcium  acetate  667 mg Oral TID WC   Chlorhexidine  Gluconate Cloth  6 each Topical Q0600   Chlorhexidine  Gluconate Cloth  6 each Topical Daily   feeding supplement  237 mL Oral BID BM   heparin   5,000 Units Subcutaneous Q8H   levothyroxine   50 mcg Oral QAC breakfast   metoprolol  tartrate  50 mg Oral BID   mupirocin  ointment  1 Application Nasal BID   pantoprazole   40 mg Oral Daily   Continuous Infusions:  sodium chloride  75 mL/hr at 06/25/24 0507   anticoagulant sodium citrate      ceFEPime  (MAXIPIME ) IV 1 g (06/24/24 2101)   metronidazole  500 mg (06/25/24 0811)   vancomycin  750 mg (06/24/24 1759)     LOS: 2 days    Time spent: 35 mins  Darcel Dawley, MD [facility]   If 7PM-7AM, please contact night-coverage  "

## 2024-06-25 NOTE — Progress Notes (Signed)
 " [facility] Progress Note   Subjective:    Seen and examined patient at bedside. Tolerated yesterday's HD with net UF 2.9L. S/p L TMA revision by Podiatry today. Plan for R BKA tomorrow by VVS. Will schedule dialysis around tomorrow's procedure.  Objective Vitals:   06/25/24 1040 06/25/24 1045 06/25/24 1100 06/25/24 1128  BP: 131/71 135/78 (!) 151/82 (!) (P) 152/73  Pulse: 62 64 63 (P) 62  Resp: 16 15 12  (P) 16  Temp: 98 F (36.7 C)  98 F (36.7 C) (!) (P) 97.5 F (36.4 C)  TempSrc:    (P) Oral  SpO2: 96% 94% 93% (P) 96%  Weight:      Height:       Physical Exam General: WDWN NAD Head: NCAT sclera not icteric  Lungs: CTA bilaterally. No wheeze, rales or rhonchi. Breathing is unlabored. Heart: RRR. No murmur, rubs or gallops.  Abdomen: soft and non-tender  Lower extremities: No LE edema; B/l TMAs-bilateral feet wrapped Neuro: AAOx3. Moves all extremities spontaneously. Dialysis Access: R [facility]  Filed Weights   06/24/24 1508 06/24/24 1902 06/25/24 0840  Weight: 69.1 kg 66.2 kg 66 kg    Intake/Output Summary (Last 24 hours) at 06/25/2024 1218 Last data filed at 06/25/2024 1035 Gross per 24 hour  Intake 2084.12 ml  Output 3210 ml  Net -1125.88 ml    Additional Objective Labs: Basic Metabolic Panel: Recent Labs  Lab 06/23/24 1340 06/24/24 0545 06/25/24 0904  NA 138 138 133*  K 3.8 4.4 5.1  CL 99 100 99  CO2 25 23  --   GLUCOSE 236* 125* 95  BUN 25* 33* 29*  CREATININE 5.51* 6.36* 4.50*  CALCIUM  8.6* 8.5*  --   PHOS  --  3.4  --    Liver Function Tests: Recent Labs  Lab 06/24/24 0545  AST 18  ALT 9  ALKPHOS 131*  BILITOT 0.3  PROT 6.3*  ALBUMIN  2.9*   No results for input(s): LIPASE, AMYLASE in the last 168 hours. CBC: Recent Labs  Lab 06/23/24 1340 06/24/24 0545 06/25/24 0904  WBC 10.9* 10.3  --   NEUTROABS 8.7* 8.3*  --   HGB 10.6* 9.5* 12.2*  HCT 36.2* 31.9* 36.0*  MCV 98.6 96.7  --   PLT 247 240  --    Blood  Culture    Component Value Date/Time   SDES BLOOD SITE NOT SPECIFIED 06/23/2024 1653   SPECREQUEST  06/23/2024 1653    BOTTLES DRAWN AEROBIC AND ANAEROBIC Blood Culture results may not be optimal due to an inadequate volume of blood received in culture bottles   CULT  06/23/2024 1653    NO GROWTH 2 DAYS Performed at [facility], [address] [place], [place] [address]    REPTSTATUS PENDING 06/23/2024 1653    Cardiac Enzymes: No results for input(s): CKTOTAL, CKMB, CKMBINDEX, TROPONINI in the last 168 hours. CBG: Recent Labs  Lab 06/23/24 1803 06/25/24 0843 06/25/24 1042  GLUCAP 154* 87 94   Iron Studies: No results for input(s): IRON, TIBC, TRANSFERRIN, FERRITIN in the last 72 hours. Lab Results  Component Value Date   INR 1.2 05/13/2024   INR 1.1 05/19/2023   INR 1.1 03/06/2023   Studies/Results: MR FOOT RIGHT W WO CONTRAST Addendum Date: 06/23/2024 ADDENDUM REPORT: 06/23/2024 21:57 ADDENDUM: The postcontrast images were missing from the original series. These are now available for review. These demonstrate a large open at the metatarsal resection site with gas in the soft tissues  creating the artifact. Nonenhancing soft tissue noted consistent with tissue necrosis. Enhancement in the remaining metatarsal bones consistent with osteomyelitis. Electronically Signed   By: MYRTIS Stammer M.D.   On: 06/23/2024 21:57   Result Date: 06/23/2024 CLINICAL DATA:  Recent transmetatarsal amputation of the right foot (04/26/2024). Pain, swelling and wound dehiscence with suspected gangrene. EXAM: MRI OF THE RIGHT FOREFOOT WITHOUT AND WITH CONTRAST TECHNIQUE: Multiplanar, multisequence MR imaging of the right foot was performed before and after the administration of intravenous contrast. CONTRAST:  7.5mL GADAVIST  GADOBUTROL  1 MMOL/ML IV SOLN COMPARISON:  Radiographs 04/26/2024 FINDINGS: Significant artifact noted at the amputation site possibly related to gas or skin  staples. Abnormal T1 and T2 signal intensity in the remaining metatarsal shafts highly suspicious bone infarcts and osteomyelitis. No findings suspicious for septic arthritis at the tarsal metatarsal joints. No obvious discrete drainable fluid collection in the subcutaneous tissues. There is diffuse cellulitis and myofasciitis. The hindfoot bony structures are intact. No evidence of tibiotalar or subtalar septic arthritis. Edema like signal changes in the foot musculature likely a combination loss of tension/function from nonfunctioning tendons and myofasciitis. IMPRESSION: 1. Significant artifact noted at the amputation site possibly related to gas or skin staples. 2. Abnormal T1 and T2 signal intensity in the remaining metatarsal shafts highly suspicious for bone infarcts and osteomyelitis. 3. No findings suspicious for septic arthritis at the tarsometatarsal joints. 4. Diffuse cellulitis and myofasciitis. Electronically Signed: By: MYRTIS Stammer M.D. On: 06/23/2024 17:51   MR FOOT LEFT W WO CONTRAST Result Date: 06/23/2024 CLINICAL DATA:  Status post mid metatarsal amputation with wound dehiscence and suspect gangrene. EXAM: MRI OF THE LEFT FOREFOOT WITHOUT AND WITH CONTRAST TECHNIQUE: Multiplanar, multisequence MR imaging of the left foot was performed both before and after administration of intravenous contrast. CONTRAST:  7.5mL GADAVIST  GADOBUTROL  1 MMOL/ML IV SOLN COMPARISON:  Radiographs 05/30/2024 FINDINGS: Open wounds noted along the mid metatarsal surgical site most notably medially overlying the residual first metatarsal. Diffuse nonenhancing soft tissue noted consistent with tissue necrosis/nonviable tissue. No rim enhancing abscess. Abnormal T1 and T2 signal intensity and subsequent contrast enhancement in the metatarsals consistent with osteomyelitis. No midfoot or hindfoot septic arthritis or osteomyelitis. Diffuse myofasciitis without definite findings for pyomyositis. IMPRESSION: 1. Open wounds  along the mid metatarsal surgical site most notably medially overlying the residual first metatarsal. Diffuse nonenhancing soft tissue noted consistent with tissue necrosis/nonviable tissue. No rim enhancing abscess. 2. Abnormal T1 and T2 signal intensity and subsequent contrast enhancement in the residual metatarsals consistent with osteomyelitis. 3. Diffuse myofasciitis without definite findings for pyomyositis. Electronically Signed   By: MYRTIS Stammer M.D.   On: 06/23/2024 18:15   DG Chest Portable 1 View Result Date: 06/23/2024 CLINICAL DATA:  Preoperative evaluation EXAM: PORTABLE CHEST 1 VIEW COMPARISON:  05/13/2024 FINDINGS: Single frontal view of the chest demonstrates right internal jugular dialysis catheter. Cardiac silhouette is enlarged but stable. Mild chronic vascular congestion without airspace disease, effusion, or pneumothorax. No acute bony abnormality. IMPRESSION: 1. Stable enlargement of the cardiac silhouette. 2. Mild chronic pulmonary vascular congestion.  No overt edema. Electronically Signed   By: Ozell Daring M.D.   On: 06/23/2024 15:52    Medications:  sodium chloride  75 mL/hr at 06/25/24 0507   anticoagulant sodium citrate      ceFEPime  (MAXIPIME ) IV 1 g (06/24/24 2101)   metronidazole  500 mg (06/25/24 0811)   vancomycin  750 mg (06/24/24 1759)    acetaminophen        amitriptyline   10  mg Oral QHS   amLODipine   10 mg Oral q morning   atorvastatin   40 mg Oral Daily   calcium  acetate  667 mg Oral TID WC   Chlorhexidine  Gluconate Cloth  6 each Topical Q0600   Chlorhexidine  Gluconate Cloth  6 each Topical Daily   feeding supplement  237 mL Oral BID BM   heparin   5,000 Units Subcutaneous Q8H   levothyroxine   50 mcg Oral QAC breakfast   metoprolol  tartrate  50 mg Oral BID   mupirocin  ointment  1 Application Nasal BID   pantoprazole   40 mg Oral Daily    Dialysis Orders: MWF - [facility] 4hrs, BFR 400, DFR Auto 1.5,  EDW 63.8kg, 3K/ 2.5Ca Mircera 225 mcg  q2wks - last 06/21/24  Assessment/Plan: Gangrene left/right foot, s/p b/l TMA - Podiatry and VVS following. S/p left-sided TMA revision 1/13 and plan for right-sided BKA on Wednesday (06/26/24). ESRD - on HD. Plan for HD tomorrow (06/26/24) per his routine schedule Hypertension/volume  - Bps elevated, euvolemic on exam, resume home Bp meds Anemia of CKD - ESA given 1/9 in outpatient, not due yet Secondary Hyperparathyroidism -  Checking phos is AM Nutrition - Renal diet with fluid restriction   Charmaine Piety, NP [facility] 06/25/2024,12:18 PM  LOS: 2 days    "

## 2024-06-25 NOTE — Plan of Care (Signed)
" °  Problem: Education: Goal: Ability to describe self-care measures that may prevent or decrease complications (Diabetes Survival Skills Education) will improve Outcome: Progressing   Problem: Coping: Goal: Ability to adjust to condition or change in health will improve Outcome: Progressing   Problem: Fluid Volume: Goal: Ability to maintain a balanced intake and output will improve Outcome: Progressing   Problem: Health Behavior/Discharge Planning: Goal: Ability to identify and utilize available resources and services will improve Outcome: Progressing Goal: Ability to manage health-related needs will improve Outcome: Progressing   Problem: Metabolic: Goal: Ability to maintain appropriate glucose levels will improve Outcome: Progressing   Problem: Nutritional: Goal: Maintenance of adequate nutrition will improve Outcome: Progressing Goal: Progress toward achieving an optimal weight will improve Outcome: Progressing   Problem: Skin Integrity: Goal: Risk for impaired skin integrity will decrease Outcome: Progressing   Problem: Tissue Perfusion: Goal: Adequacy of tissue perfusion will improve Outcome: Progressing   Problem: Education: Goal: Knowledge of General Education information will improve Description: Including pain rating scale, medication(s)/side effects and non-pharmacologic comfort measures Outcome: Progressing   Problem: Health Behavior/Discharge Planning: Goal: Ability to manage health-related needs will improve Outcome: Progressing   Problem: Clinical Measurements: Goal: Ability to maintain clinical measurements within normal limits will improve Outcome: Progressing Goal: Will remain free from infection Outcome: Progressing Goal: Diagnostic test results will improve Outcome: Progressing Goal: Respiratory complications will improve Outcome: Progressing Goal: Cardiovascular complication will be avoided Outcome: Progressing   Problem: Activity: Goal:  Risk for activity intolerance will decrease Outcome: Progressing   Problem: Nutrition: Goal: Adequate nutrition will be maintained Outcome: Progressing   Problem: Coping: Goal: Level of anxiety will decrease Outcome: Progressing   Problem: Elimination: Goal: Will not experience complications related to bowel motility Outcome: Progressing Goal: Will not experience complications related to urinary retention Outcome: Progressing   Problem: Pain Managment: Goal: General experience of comfort will improve and/or be controlled Outcome: Progressing   Problem: Safety: Goal: Ability to remain free from injury will improve Outcome: Progressing   Problem: Skin Integrity: Goal: Risk for impaired skin integrity will decrease Outcome: Progressing   Problem: Education: Goal: Knowledge of the prescribed therapeutic regimen will improve Outcome: Progressing   Problem: Bowel/Gastric: Goal: Gastrointestinal status for postoperative course will improve Outcome: Progressing   Problem: Cardiac: Goal: Ability to maintain an adequate cardiac output Outcome: Progressing Goal: Will show no evidence of cardiac arrhythmias Outcome: Progressing   Problem: Nutritional: Goal: Will attain and maintain optimal nutritional status Outcome: Progressing   Problem: Neurological: Goal: Will regain or maintain usual level of consciousness Outcome: Progressing   Problem: Clinical Measurements: Goal: Ability to maintain clinical measurements within normal limits Outcome: Progressing Goal: Postoperative complications will be avoided or minimized Outcome: Progressing   Problem: Respiratory: Goal: Will regain and/or maintain adequate ventilation Outcome: Progressing Goal: Respiratory status will improve Outcome: Progressing   Problem: Urinary Elimination: Goal: Will remain free from infection Outcome: Progressing Goal: Ability to achieve and maintain adequate urine output Outcome: Progressing    "

## 2024-06-25 NOTE — Progress Notes (Signed)
 Initial Nutrition Assessment  DOCUMENTATION CODES:   Not applicable  INTERVENTION:   Recommend regular diet to promote po intake  1 packet Juven BID, each packet provides 95 calories, 2.5 grams of protein (collagen),to support wound healing Ensure Plus High Protein po BID, each supplement provides 350 kcal and 20 grams of protein Prosource Plus BID, each supplement provides 100 kcal, 15 grams protein Renal MVI  daily 100 mg Thiamine  daily  Monitor magnesium, potassium, and phosphorus daily for at least 3 days, MD to replete as needed, as pt is at risk for refeeding syndrome RD will draw labs to rule out potential micronutrient deficiencies which may impede wound healing: zinc , vitamin C, vitamin A , Vitamin D , Vitamin E, and CRP    NUTRITION DIAGNOSIS:   Increased nutrient needs related to chronic illness as evidenced by estimated needs.  GOAL:   Patient will meet greater than or equal to 90% of their needs  MONITOR:   PO intake, Supplement acceptance, Diet advancement, Labs, Weight trends, I & O's, Skin  REASON FOR ASSESSMENT:   Consult Wound healing  ASSESSMENT:   64 yr old Male with PMH significant for poorly controlled type 2 diabetes mellitus, ESRD on HD MWF, severe PAD, HLD, GERD, hypothyroidism, and cardiomyopathy. Patient underwent left foot ray amputation on 03/25/24 and Right TMA on 04/26/24. Who presents with bilateral foot gangrene and nonhealing wounds.  1/13: s/p Revision left foot transmetatarsal to lisfranc amputation   Pt unavailable at time of visit, in OR for left TMA. History obtained through chart review.   Had HD yesterday with UF 2.9L. Plan for R BKA tomorrow.   Noted he has experienced a 15.5% weight loss over the past 6 months, which is significant for time frame. Per nephrology notes, EDW is 63.8 kg down from 69 kg in October 2025, 7.5% weight loss in 3 months. Highly suspect malnutrition (has history of severe malnutrition) however, unable to  identify at this time. He would greatly benefit from addition of oral nutrition supplements. NPO for OR.   Admit weight: 74.6 kg - Fluid? Current weight: 66 kg   EDW: 63.8 kg  Average Meal Intake: NPO  Nutritionally Relevant Medications: Scheduled Meds:  (feeding supplement) PROSource Plus  30 mL Oral BID AC   feeding supplement  237 mL Oral BID BM   [START ON 06/26/2024] multivitamin  1 tablet Oral QHS   mupirocin  ointment  1 Application Nasal BID   [START ON 06/26/2024] nutrition supplement (JUVEN)  1 packet Oral BID BM   pantoprazole   40 mg Oral Daily   [START ON 06/26/2024] thiamine   100 mg Oral Daily   Continuous Infusions:  sodium chloride  75 mL/hr at 06/25/24 0507   anticoagulant sodium citrate      ceFEPime  (MAXIPIME ) IV Stopped (06/24/24 2200)   metronidazole  500 mg (06/25/24 0811)   vancomycin  Stopped (06/24/24 1901)   Labs Reviewed: Sodium 133 Potassium 5.1 BUN 29 Creatinine 4.50 CBG ranges from 87-154 mg/dL over the last 24 hours HgbA1c 4.6  NUTRITION - FOCUSED PHYSICAL EXAM: - Deferred to FUP   Diet Order:   Diet Order             Diet NPO time specified Except for: Sips with Meds  Diet effective midnight           Diet renal/carb modified with fluid restriction Fluid restriction: 1200 mL Fluid; Room service appropriate? Yes; Fluid consistency: Thin  Diet effective now  EDUCATION NEEDS:   Not appropriate for education at this time  Skin:  Skin Assessment: Reviewed RN Assessment (Closed surgical incision Bilateral feet)  Last BM:  PTA  Height:   Ht Readings from Last 1 Encounters:  06/25/24 5' 10 (1.778 m)    Weight:   Wt Readings from Last 1 Encounters:  06/25/24 66 kg    BMI:  Body mass index is 20.88 kg/m.  Estimated Nutritional Needs:   Kcal:  2000-2200 kcal  Protein:  100-120 gm  Fluid:  100-120 gm  Olivia Kenning, RD Registered Dietitian  See Amion for more information

## 2024-06-25 NOTE — Transfer of Care (Signed)
 Immediate Anesthesia Transfer of Care Note  Patient: [name] [name]  Procedure(s) Performed: AMPUTATION, FOOT, TRANSMETATARSAL Left foot revision TMA to lisfranc amputation (Left: Toe)  Patient Location: PACU  Anesthesia Type:MAC  Level of Consciousness: awake  Airway & Oxygen Therapy: Patient Spontanous Breathing and Patient connected to nasal cannula oxygen  Post-op Assessment: Report given to RN and Post -op Vital signs reviewed and stable  Post vital signs: Reviewed and stable  Last Vitals:  Vitals Value Taken Time  BP 131/71 06/25/24 10:40  Temp    Pulse 64 06/25/24 10:41  Resp 21 06/25/24 10:41  SpO2 96 % 06/25/24 10:41  Vitals shown include unfiled device data.  Last Pain:  Vitals:   06/25/24 0840  TempSrc: Oral  PainSc:          Complications: No notable events documented.

## 2024-06-25 NOTE — Anesthesia Postprocedure Evaluation (Signed)
"   Anesthesia Post Note  Patient: [name] [name]  Procedure(s) Performed: AMPUTATION, FOOT, TRANSMETATARSAL Left foot revision TMA to lisfranc amputation (Left: Toe)     Patient location during evaluation: PACU Anesthesia Type: Regional and MAC Level of consciousness: awake and alert Pain management: pain level controlled Vital Signs Assessment: post-procedure vital signs reviewed and stable Respiratory status: spontaneous breathing, nonlabored ventilation, respiratory function stable and patient connected to nasal cannula oxygen Cardiovascular status: stable and blood pressure returned to baseline Postop Assessment: no apparent nausea or vomiting Anesthetic complications: no   No notable events documented.  Last Vitals:  Vitals:   06/25/24 1100 06/25/24 1128  BP: (!) 151/82 (!) (P) 152/73  Pulse: 63 (P) 62  Resp: 12 (P) 16  Temp: 36.7 C (!) (P) 36.4 C  SpO2: 93% (P) 96%    Last Pain:  Vitals:   06/25/24 1128  TempSrc: (P) Oral  PainSc:                  Gypsy Kellogg L Glema Takaki      "

## 2024-06-26 ENCOUNTER — Encounter (HOSPITAL_COMMUNITY)
Admission: EM | Disposition: A | Discharge status: Skilled Nursing Facility | Payer: Self-pay | Source: Home / Self Care | Attending: Family Medicine

## 2024-06-26 ENCOUNTER — Inpatient Hospital Stay (HOSPITAL_COMMUNITY): Admitting: Certified Registered"

## 2024-06-26 ENCOUNTER — Encounter (HOSPITAL_COMMUNITY): Payer: Self-pay | Admitting: Podiatry

## 2024-06-26 DIAGNOSIS — T8789 Other complications of amputation stump: Secondary | ICD-10-CM | POA: Diagnosis not present

## 2024-06-26 DIAGNOSIS — D631 Anemia in chronic kidney disease: Secondary | ICD-10-CM | POA: Diagnosis not present

## 2024-06-26 DIAGNOSIS — I96 Gangrene, not elsewhere classified: Secondary | ICD-10-CM | POA: Diagnosis not present

## 2024-06-26 DIAGNOSIS — I5043 Acute on chronic combined systolic (congestive) and diastolic (congestive) heart failure: Secondary | ICD-10-CM

## 2024-06-26 DIAGNOSIS — N186 End stage renal disease: Secondary | ICD-10-CM | POA: Diagnosis not present

## 2024-06-26 DIAGNOSIS — I132 Hypertensive heart and chronic kidney disease with heart failure and with stage 5 chronic kidney disease, or end stage renal disease: Secondary | ICD-10-CM

## 2024-06-26 HISTORY — PX: AMPUTATION: SHX166

## 2024-06-26 LAB — CBC
HCT: 34.1 % — ABNORMAL LOW (ref 39.0–52.0)
Hemoglobin: 10.4 g/dL — ABNORMAL LOW (ref 13.0–17.0)
MCH: 28.4 pg (ref 26.0–34.0)
MCHC: 30.5 g/dL (ref 30.0–36.0)
MCV: 93.2 fL (ref 80.0–100.0)
Platelets: 315 K/uL (ref 150–400)
RBC: 3.66 MIL/uL — ABNORMAL LOW (ref 4.22–5.81)
RDW: 15.4 % (ref 11.5–15.5)
WBC: 12.7 K/uL — ABNORMAL HIGH (ref 4.0–10.5)
nRBC: 0 % (ref 0.0–0.2)

## 2024-06-26 LAB — BASIC METABOLIC PANEL WITH GFR
Anion gap: 17 — ABNORMAL HIGH (ref 5–15)
BUN: 39 mg/dL — ABNORMAL HIGH (ref 8–23)
CO2: 22 mmol/L (ref 22–32)
Calcium: 8.9 mg/dL (ref 8.9–10.3)
Chloride: 97 mmol/L — ABNORMAL LOW (ref 98–111)
Creatinine, Ser: 5.38 mg/dL — ABNORMAL HIGH (ref 0.61–1.24)
GFR, Estimated: 11 mL/min — ABNORMAL LOW
Glucose, Bld: 135 mg/dL — ABNORMAL HIGH (ref 70–99)
Potassium: 4.5 mmol/L (ref 3.5–5.1)
Sodium: 137 mmol/L (ref 135–145)

## 2024-06-26 LAB — GLUCOSE, CAPILLARY
Glucose-Capillary: 107 mg/dL — ABNORMAL HIGH (ref 70–99)
Glucose-Capillary: 114 mg/dL — ABNORMAL HIGH (ref 70–99)
Glucose-Capillary: 133 mg/dL — ABNORMAL HIGH (ref 70–99)

## 2024-06-26 LAB — POCT I-STAT, CHEM 8
BUN: 41 mg/dL — ABNORMAL HIGH (ref 8–23)
Calcium, Ion: 1 mmol/L — ABNORMAL LOW (ref 1.15–1.40)
Chloride: 102 mmol/L (ref 98–111)
Creatinine, Ser: 5.7 mg/dL — ABNORMAL HIGH (ref 0.61–1.24)
Glucose, Bld: 114 mg/dL — ABNORMAL HIGH (ref 70–99)
HCT: 35 % — ABNORMAL LOW (ref 39.0–52.0)
Hemoglobin: 11.9 g/dL — ABNORMAL LOW (ref 13.0–17.0)
Potassium: 4.4 mmol/L (ref 3.5–5.1)
Sodium: 134 mmol/L — ABNORMAL LOW (ref 135–145)
TCO2: 21 mmol/L — ABNORMAL LOW (ref 22–32)

## 2024-06-26 LAB — VANCOMYCIN, RANDOM: Vancomycin Rm: 23 ug/mL

## 2024-06-26 LAB — C-REACTIVE PROTEIN: CRP: 6.2 mg/dL — ABNORMAL HIGH

## 2024-06-26 LAB — PHOSPHORUS: Phosphorus: 5.2 mg/dL — ABNORMAL HIGH (ref 2.5–4.6)

## 2024-06-26 LAB — VITAMIN D 25 HYDROXY (VIT D DEFICIENCY, FRACTURES): Vit D, 25-Hydroxy: 33.8 ng/mL (ref 30–100)

## 2024-06-26 MED ORDER — VANCOMYCIN HCL 750 MG/150ML IV SOLN
INTRAVENOUS | Status: AC
  Filled 2024-06-26: qty 150

## 2024-06-26 MED ORDER — LIDOCAINE 2% (20 MG/ML) 5 ML SYRINGE
INTRAMUSCULAR | Status: DC | PRN
  Administered 2024-06-26: 60 mg via INTRAVENOUS

## 2024-06-26 MED ORDER — HYDROMORPHONE HCL 1 MG/ML IJ SOLN
0.5000 mg | INTRAMUSCULAR | Status: DC | PRN
  Administered 2024-06-26 – 2024-07-02 (×19): 1 mg via INTRAVENOUS
  Filled 2024-06-26 (×18): qty 1

## 2024-06-26 MED ORDER — INSULIN ASPART 100 UNIT/ML IJ SOLN: 0.0000 [IU] | INTRAMUSCULAR | Status: DC | PRN

## 2024-06-26 MED ORDER — CHLORHEXIDINE GLUCONATE 0.12 % MT SOLN: 15.0000 mL | Freq: Once | OROMUCOSAL | Status: AC

## 2024-06-26 MED ORDER — LORAZEPAM 0.5 MG PO TABS
0.5000 mg | ORAL_TABLET | Freq: Once | ORAL | Status: AC
  Administered 2024-06-26: 0.5 mg via ORAL
  Filled 2024-06-26: qty 1

## 2024-06-26 MED ORDER — HEPARIN 6000 UNIT IRRIGATION SOLUTION
Status: AC
  Filled 2024-06-26: qty 500

## 2024-06-26 MED ORDER — FENTANYL CITRATE (PF) 100 MCG/2ML IJ SOLN
INTRAMUSCULAR | Status: AC
  Filled 2024-06-26: qty 2

## 2024-06-26 MED ORDER — VANCOMYCIN HCL 750 MG/150ML IV SOLN
750.0000 mg | INTRAVENOUS | Status: DC
  Administered 2024-06-26: 750 mg via INTRAVENOUS
  Filled 2024-06-26: qty 150

## 2024-06-26 MED ORDER — BUPIVACAINE-EPINEPHRINE (PF) 0.25% -1:200000 IJ SOLN
INTRAMUSCULAR | Status: DC | PRN
  Administered 2024-06-26: 5 mL via PERINEURAL

## 2024-06-26 MED ORDER — CEFAZOLIN SODIUM-DEXTROSE 2-3 GM-%(50ML) IV SOLR
INTRAVENOUS | Status: DC | PRN
  Administered 2024-06-26: 2 g via INTRAVENOUS

## 2024-06-26 MED ORDER — LIDOCAINE 2% (20 MG/ML) 5 ML SYRINGE
INTRAMUSCULAR | Status: AC
  Filled 2024-06-26: qty 5

## 2024-06-26 MED ORDER — PROPOFOL 500 MG/50ML IV EMUL
INTRAVENOUS | Status: DC | PRN
  Administered 2024-06-26: 100 ug/kg/min via INTRAVENOUS

## 2024-06-26 MED ORDER — SODIUM CHLORIDE 0.9 % IV SOLN: INTRAVENOUS | Status: DC

## 2024-06-26 MED ORDER — LIDOCAINE-EPINEPHRINE (PF) 1.5 %-1:200000 IJ SOLN
INTRAMUSCULAR | Status: DC | PRN
  Administered 2024-06-26 (×2): 5 mL via PERINEURAL

## 2024-06-26 MED ORDER — CEFAZOLIN SODIUM 1 G IJ SOLR
INTRAMUSCULAR | Status: AC
  Filled 2024-06-26: qty 20

## 2024-06-26 MED ORDER — VANCOMYCIN HCL 750 MG/150ML IV SOLN: 750.0000 mg | INTRAVENOUS | Status: DC

## 2024-06-26 MED ORDER — FENTANYL CITRATE (PF) 100 MCG/2ML IJ SOLN
INTRAMUSCULAR | Status: DC | PRN
  Administered 2024-06-26: 50 ug via INTRAVENOUS
  Administered 2024-06-26: 25 ug via INTRAVENOUS

## 2024-06-26 MED ORDER — FENTANYL CITRATE (PF) 100 MCG/2ML IJ SOLN: 25.0000 ug | INTRAMUSCULAR | Status: DC | PRN

## 2024-06-26 MED ORDER — BUPIVACAINE-EPINEPHRINE (PF) 0.5% -1:200000 IJ SOLN
INTRAMUSCULAR | Status: DC | PRN
  Administered 2024-06-26: 10 mL via PERINEURAL
  Administered 2024-06-26: 20 mL via PERINEURAL

## 2024-06-26 MED ORDER — OXYCODONE HCL 5 MG PO TABS
5.0000 mg | ORAL_TABLET | ORAL | Status: DC | PRN
  Administered 2024-06-27 – 2024-07-08 (×19): 10 mg via ORAL
  Filled 2024-06-26 (×20): qty 2

## 2024-06-26 MED ORDER — PROPOFOL 10 MG/ML IV BOLUS
INTRAVENOUS | Status: AC
  Filled 2024-06-26: qty 20

## 2024-06-26 MED ORDER — HEPARIN SODIUM (PORCINE) 1000 UNIT/ML IJ SOLN
INTRAMUSCULAR | Status: AC
  Filled 2024-06-26: qty 4

## 2024-06-26 MED ORDER — CHLORHEXIDINE GLUCONATE 0.12 % MT SOLN
OROMUCOSAL | Status: AC
  Administered 2024-06-26: 15 mL via OROMUCOSAL
  Filled 2024-06-26: qty 15

## 2024-06-26 MED ORDER — ORAL CARE MOUTH RINSE: 15.0000 mL | Freq: Once | OROMUCOSAL | Status: AC

## 2024-06-26 MED ORDER — 0.9 % SODIUM CHLORIDE (POUR BTL) OPTIME
TOPICAL | Status: DC | PRN
  Administered 2024-06-26 (×2): 1000 mL

## 2024-06-26 NOTE — Transfer of Care (Signed)
 Immediate Anesthesia Transfer of Care Note  Patient: [name] [name]  Procedure(s) Performed: RIGHT BELOW KNEE AMPUTATION (Right: Leg Lower)  Patient Location: PACU  Anesthesia Type:MAC and Regional  Level of Consciousness: awake and drowsy  Airway & Oxygen Therapy: Patient Spontanous Breathing and Patient connected to nasal cannula oxygen  Post-op Assessment: Report given to RN and Post -op Vital signs reviewed and stable  Post vital signs: Reviewed and stable  Last Vitals:  Vitals Value Taken Time  BP 131/63 06/26/24 13:19  Temp 36.4 C 06/26/24 12:50  Pulse 56 06/26/24 13:30  Resp 11 06/26/24 13:30  SpO2 99 % 06/26/24 13:30  Vitals shown include unfiled device data.  Last Pain:  Vitals:   06/26/24 1250  TempSrc:   PainSc: Asleep         Complications: No notable events documented.

## 2024-06-26 NOTE — Progress Notes (Addendum)
 " [facility] Progress Note   Subjective:    Seen and examined patient at bedside. He denies SOB, CP, and N/V. S/p L TMA revision by Podiatry 1/13. Plan for R BKA this morning by Dr. Lanis. Plan for HD this afternoon/evening. Phlebotomist getting AM labs now at bedside.  Objective Vitals:   06/25/24 2033 06/25/24 2337 06/26/24 0500 06/26/24 0815  BP: (!) 159/60 (!) 161/78 (!) 140/59 (!) 151/77  Pulse: 72 68 (!) 56 66  Resp: 20 19 18 16   Temp: 97.8 F (36.6 C) 97.8 F (36.6 C) 98.3 F (36.8 C) 97.6 F (36.4 C)  TempSrc:      SpO2: 99% 100% 94% 96%  Weight:      Height:       Physical Exam General: Awake, alert, on RA, NAD Head: NCAT sclera not icteric  Lungs: CTA bilaterally. No wheeze, rales or rhonchi. Breathing is unlabored. Heart: RRR. No murmur, rubs or gallops.  Abdomen: soft and non-tender  Lower extremities: No LE edema; B/l TMAs-R foot wrapped, L foot wrapped with ace Neuro: AAOx3. Moves all extremities spontaneously. Dialysis Access: R [facility]  Filed Weights   06/24/24 1508 06/24/24 1902 06/25/24 0840  Weight: 69.1 kg 66.2 kg 66 kg    Intake/Output Summary (Last 24 hours) at 06/26/2024 0838 Last data filed at 06/26/2024 0300 Gross per 24 hour  Intake 1265.22 ml  Output 10 ml  Net 1255.22 ml    Additional Objective Labs: Basic Metabolic Panel: Recent Labs  Lab 06/23/24 1340 06/24/24 0545 06/25/24 0904  NA 138 138 133*  K 3.8 4.4 5.1  CL 99 100 99  CO2 25 23  --   GLUCOSE 236* 125* 95  BUN 25* 33* 29*  CREATININE 5.51* 6.36* 4.50*  CALCIUM  8.6* 8.5*  --   PHOS  --  3.4  --    Liver Function Tests: Recent Labs  Lab 06/24/24 0545  AST 18  ALT 9  ALKPHOS 131*  BILITOT 0.3  PROT 6.3*  ALBUMIN  2.9*   No results for input(s): LIPASE, AMYLASE in the last 168 hours. CBC: Recent Labs  Lab 06/23/24 1340 06/24/24 0545 06/25/24 0904  WBC 10.9* 10.3  --   NEUTROABS 8.7* 8.3*  --   HGB 10.6* 9.5* 12.2*  HCT 36.2* 31.9* 36.0*   MCV 98.6 96.7  --   PLT 247 240  --    Blood Culture    Component Value Date/Time   SDES TISSUE 06/25/2024 1020   SPECREQUEST LEFT FOOT TISSUE 06/25/2024 1020   CULT  06/25/2024 1020    CULTURE REINCUBATED FOR BETTER GROWTH Performed at [facility], [address] [place], [place] [address]    REPTSTATUS PENDING 06/25/2024 1020    Cardiac Enzymes: No results for input(s): CKTOTAL, CKMB, CKMBINDEX, TROPONINI in the last 168 hours. CBG: Recent Labs  Lab 06/23/24 1803 06/25/24 0843 06/25/24 1042  GLUCAP 154* 87 94   Iron Studies: No results for input(s): IRON, TIBC, TRANSFERRIN, FERRITIN in the last 72 hours. Lab Results  Component Value Date   INR 1.2 05/13/2024   INR 1.1 05/19/2023   INR 1.1 03/06/2023   Studies/Results: DG Foot 2 Views Left Result Date: 06/25/2024 CLINICAL DATA:  Postoperative state EXAM: LEFT FOOT - 2 VIEW COMPARISON:  Radiograph dated 05/30/2024. FINDINGS: Amputation of the foot distal to the tarsal bones. No acute fracture or dislocation. Vascular calcifications noted. Cutaneous clips noted over the stump. IMPRESSION: Amputation of the foot distal to the tarsal  bones. Electronically Signed   By: Vanetta Chou M.D.   On: 06/25/2024 15:44    Medications:  sodium chloride  75 mL/hr at 06/25/24 0507   anticoagulant sodium citrate      ceFEPime  (MAXIPIME ) IV Stopped (06/25/24 2119)   metronidazole  500 mg (06/26/24 0809)   vancomycin  Stopped (06/24/24 1901)    (feeding supplement) PROSource Plus  30 mL Oral BID AC   amitriptyline   10 mg Oral QHS   amLODipine   10 mg Oral q morning   atorvastatin   40 mg Oral Daily   calcium  acetate  667 mg Oral TID WC   Chlorhexidine  Gluconate Cloth  6 each Topical Q0600   Chlorhexidine  Gluconate Cloth  6 each Topical Daily   feeding supplement  237 mL Oral BID BM   heparin   5,000 Units Subcutaneous Q8H   levothyroxine   50 mcg Oral QAC breakfast   metoprolol  tartrate  50 mg Oral BID    multivitamin  1 tablet Oral QHS   mupirocin  ointment  1 Application Nasal BID   nutrition supplement (JUVEN)  1 packet Oral BID BM   pantoprazole   40 mg Oral Daily   thiamine   100 mg Oral Daily    Dialysis Orders: MWF - [facility] 4hrs, BFR 400, DFR Auto 1.5,  EDW 63.8kg, 3K/ 2.5Ca Mircera 225 mcg q2wks - last 06/21/24  Assessment/Plan: Gangrene left/right foot, s/p b/l TMA - Podiatry and VVS following. S/p left-sided TMA revision 1/13 and plan for right-sided BKA on this morning by Dr. Lanis. ESRD - on HD. Plan for HD this afternoon/evening, need to schedule around procedure. Hypertension/volume  - Bps elevated, euvolemic on exam, resume home Bp meds Anemia of CKD - ESA given 1/9 in outpatient, not due yet Secondary Hyperparathyroidism -  Checking phos is AM Nutrition - Renal diet with fluid restriction  Charmaine Piety, NP [facility] 06/26/2024,8:38 AM  LOS: 3 days    "

## 2024-06-26 NOTE — Progress Notes (Signed)
 " PROGRESS NOTE    [name] [name]  FMW:[id number] DOB: 05-15-1961 DOA: 06/23/2024  PCP: Kip Righter, MD   Brief Narrative:  This 64 yrs old Male with PMH significant for poorly controlled type 2 diabetes mellitus, End-stage renal disease on hemodialysis (M/W/F schedule), severe peripheral arterial disease, hyperlipidemia, hypothyroidism, and cardiomyopathy who presents from podiatry clinic with bilateral foot gangrene and nonhealing wounds.  Patient underwent left foot ray amputation on 03/25/24 and Right TMA on 04/26/24.  Despite these intervention and multiple prior revascularization procedures he now presents with dry gangrene/necrotic eschar at the right TMA site and nonhealing wounds at the left TMA site. His vascular history is extensive, including multiple lower extremity angiograms with interventions (01/30/2024, 03/20/2024, 04/16/2024) including peripheral atherectomy, intravascular lithotripsy, and angioplasty. He has bilateral AV grafts for dialysis access (right arm 08/17/2023, left arm 12/27/2022) and currently has a right internal jugular dialysis catheter.  MRI left foot is ordered.  Patient was admitted for further evaluation.  Vascular surgery and podiatry is consulted.  Assessment & Plan:   Principal Problem:   Gangrene of foot (HCC)  Dry gangrene / Necrotic eschar Right TMA site: Left foot nonhealing post TMA : Bilateral Osteomyelitis: Severe PAD / Limb ischemia: Continue empiric antibiotics (Vancomycin , cefepime , Flagyl ) MRI Right Foot > highly suspicious for bone infarcts and osteomyelitis.  Diffuse cellulitis and myofasciitis. MRI left foot > Open wounds along mid metatarsal surgical site, soft tissue inflammation consistent with tissue necrosis,  nonviable tissue.  Osteomyelitis. Patient underwent revision of left foot transmetatarsal to lisfranc amputation.  Patient underwent right BKA by vascular surgery today. Nonweightbearing bilateral lower extremity for  now.  End-stage renal disease on hemodialysis: Nephrology is consulted for dialysis coordination. Continue volume status, monitor electrolytes. Patient is having hemodialysis today following right BKA.  Type 2 diabetes: Hold p.o. diabetic medications Continue regular insulin  sliding scale.  Chronic combined systolic and diastolic CHF: Appears euvolemic on exam. Chest x-ray showed cardiomegaly. Continue volume management with hemodialysis.  Hypothyroidism: Continue levothyroxine .  Essential hypertension: Continue amlodipine . Continue metoprolol .  GERD: Continue pantoprazole .   DVT prophylaxis: Heparin  sq Code Status: Full code Family Communication: No family at bed side Disposition Plan:   Status is: Inpatient Remains inpatient appropriate because: Patient admitted for critical limb ischemia leading to necrotic TMA site.  Started on empiric antibiotics , vascular and podiatry is planning intervention this admission. Patient is not medically ready for discharge yet.    Consultants:  Podiatry Vascular surgery Nephrology  Procedures:  Antimicrobials:  Anti-infectives (From admission, onward)    Start     Dose/Rate Route Frequency Ordered Stop   06/24/24 1200  vancomycin  (VANCOREADY) IVPB 750 mg/150 mL        750 mg 150 mL/hr over 60 Minutes Intravenous Every M-W-F (Hemodialysis) 06/23/24 2022     06/23/24 2030  metroNIDAZOLE  (FLAGYL ) IVPB 500 mg        500 mg 100 mL/hr over 60 Minutes Intravenous Every 12 hours 06/23/24 2018     06/23/24 2030  ceFEPIme  (MAXIPIME ) 1 g in sodium chloride  0.9 % 100 mL IVPB        1 g 200 mL/hr over 30 Minutes Intravenous Every 24 hours 06/23/24 2021     06/23/24 2030  vancomycin  (VANCOREADY) IVPB 1500 mg/300 mL        1,500 mg 150 mL/hr over 120 Minutes Intravenous  Once 06/23/24 2022 06/24/24 0152      Subjective: Patient was seen and examined at HD suite.  Overnight events  noted. Patient seems alert and oriented,  following  commands.  He underwent revision left foot transmetatarsal to lisfranc amputation yesterday. He underwent right BKA today.  Objective: Vitals:   06/26/24 1215 06/26/24 1230 06/26/24 1250 06/26/24 1303  BP: (!) 101/53 (!) 120/54 131/62 (!) 133/58  Pulse: (!) 57 (!) 53 (!) 52 (!) 51  Resp: 18 12 12 14   Temp:  97.7 F (36.5 C) 97.6 F (36.4 C)   TempSrc:      SpO2: 100% 90% 99% 100%  Weight:   63.6 kg   Height:        Intake/Output Summary (Last 24 hours) at 06/26/2024 1319 Last data filed at 06/26/2024 0300 Gross per 24 hour  Intake 1065.22 ml  Output --  Net 1065.22 ml   Filed Weights   06/25/24 0840 06/26/24 0925 06/26/24 1250  Weight: 66 kg 66 kg 63.6 kg    Examination:  General exam: Appears calm and comfortable, deconditioned, not in any acute distress. Respiratory system: CTA Bilaterally. Respiratory effort normal.  RR 15 Cardiovascular system: S1 & S2 heard, RRR. No JVD, murmurs, rubs, gallops or clicks.  Gastrointestinal system: Abdomen is non distended, soft and non tender. Normal bowel sounds heard. Central nervous system: Alert and oriented x 3. No focal neurological deficits. Extremities: Right BKA, left TMA site covered with dressing Skin: No rashes, lesions or ulcers Psychiatry: Judgement and insight appear normal. Mood & affect appropriate.    Data Reviewed: I have personally reviewed following labs and imaging studies  CBC: Recent Labs  Lab 06/23/24 1340 06/24/24 0545 06/25/24 0904 06/26/24 0848 06/26/24 0944  WBC 10.9* 10.3  --  12.7*  --   NEUTROABS 8.7* 8.3*  --   --   --   HGB 10.6* 9.5* 12.2* 10.4* 11.9*  HCT 36.2* 31.9* 36.0* 34.1* 35.0*  MCV 98.6 96.7  --  93.2  --   PLT 247 240  --  315  --    Basic Metabolic Panel: Recent Labs  Lab 06/23/24 1340 06/24/24 0545 06/25/24 0904 06/26/24 0848 06/26/24 0944  NA 138 138 133* 137 134*  K 3.8 4.4 5.1 4.5 4.4  CL 99 100 99 97* 102  CO2 25 23  --  22  --   GLUCOSE 236* 125* 95 135* 114*   BUN 25* 33* 29* 39* 41*  CREATININE 5.51* 6.36* 4.50* 5.38* 5.70*  CALCIUM  8.6* 8.5*  --  8.9  --   MG  --  2.0  --   --   --   PHOS  --  3.4  --  5.2*  --    GFR: Estimated Creatinine Clearance: 11.6 mL/min (A) (by C-G formula based on SCr of 5.7 mg/dL (H)). Liver Function Tests: Recent Labs  Lab 06/24/24 0545  AST 18  ALT 9  ALKPHOS 131*  BILITOT 0.3  PROT 6.3*  ALBUMIN  2.9*   No results for input(s): LIPASE, AMYLASE in the last 168 hours. No results for input(s): AMMONIA in the last 168 hours. Coagulation Profile: No results for input(s): INR, PROTIME in the last 168 hours. Cardiac Enzymes: No results for input(s): CKTOTAL, CKMB, CKMBINDEX, TROPONINI in the last 168 hours. BNP (last 3 results) No results for input(s): PROBNP in the last 8760 hours. HbA1C: No results for input(s): HGBA1C in the last 72 hours. CBG: Recent Labs  Lab 06/25/24 0843 06/25/24 1042 06/26/24 0900 06/26/24 0927 06/26/24 1211  GLUCAP 87 94 114* 107* 133*   Lipid Profile: No results for input(s):  CHOL, HDL, LDLCALC, TRIG, CHOLHDL, LDLDIRECT in the last 72 hours. Thyroid  Function Tests: No results for input(s): TSH, T4TOTAL, FREET4, T3FREE, THYROIDAB in the last 72 hours. Anemia Panel: No results for input(s): VITAMINB12, FOLATE, FERRITIN, TIBC, IRON, RETICCTPCT in the last 72 hours. Sepsis Labs: No results for input(s): PROCALCITON, LATICACIDVEN in the last 168 hours.  Recent Results (from the past 240 hours)  Blood culture (routine x 2)     Status: None (Preliminary result)   Collection Time: 06/23/24  4:48 PM   Specimen: BLOOD  Result Value Ref Range Status   Specimen Description BLOOD SITE NOT SPECIFIED  Final   Special Requests   Final    BOTTLES DRAWN AEROBIC AND ANAEROBIC Blood Culture results may not be optimal due to an inadequate volume of blood received in culture bottles   Culture   Final    NO GROWTH 3  DAYS Performed at [facility], [address] [place], [place] [address]    Report Status PENDING  Incomplete  Blood culture (routine x 2)     Status: None (Preliminary result)   Collection Time: 06/23/24  4:53 PM   Specimen: BLOOD  Result Value Ref Range Status   Specimen Description BLOOD SITE NOT SPECIFIED  Final   Special Requests   Final    BOTTLES DRAWN AEROBIC AND ANAEROBIC Blood Culture results may not be optimal due to an inadequate volume of blood received in culture bottles   Culture   Final    NO GROWTH 3 DAYS Performed at [facility], [address], [place], [place] [address]    Report Status PENDING  Incomplete  Surgical pcr screen     Status: Abnormal   Collection Time: 06/25/24  2:26 AM   Specimen: Nasal Mucosa; Nasal Swab  Result Value Ref Range Status   MRSA, PCR POSITIVE (A) NEGATIVE Final   Staphylococcus aureus POSITIVE (A) NEGATIVE Final    Comment: RESULT CALLED TO, READ BACK BY AND VERIFIED WITH: A LYNPHACUM RN 06/25/2024 @ 0414 BY AB (NOTE) The Xpert SA Assay (FDA approved for NASAL specimens in patients 80 years of age and older), is one component of a comprehensive surveillance program. It is not intended to diagnose infection nor to guide or monitor treatment. Performed at [facility], [address], [place], [place] [address]   Aerobic/Anaerobic Culture w Gram Stain (surgical/deep wound)     Status: None (Preliminary result)   Collection Time: 06/25/24 10:19 AM   Specimen: PATH Soft tissue  Result Value Ref Range Status   Specimen Description TISSUE  Final   Special Requests LEFT FOOT ABSCESS  Final   Gram Stain NO WBC SEEN RARE GRAM POSITIVE COCCI   Final   Culture   Final    MODERATE STAPHYLOCOCCUS AUREUS SUSCEPTIBILITIES TO FOLLOW Performed at [facility], [address] [place], [place] [address]    Report Status PENDING  Incomplete  Aerobic/Anaerobic Culture w Gram Stain (surgical/deep wound)     Status:  None (Preliminary result)   Collection Time: 06/25/24 10:20 AM   Specimen: PATH Soft tissue  Result Value Ref Range Status   Specimen Description TISSUE  Final   Special Requests LEFT FOOT TISSUE  Final   Gram Stain   Final    NO WBC SEEN ABUNDANT GRAM POSITIVE COCCI Performed at [facility], [address] [place], [place] [address]    Culture ABUNDANT STAPHYLOCOCCUS AUREUS  Final  Report Status PENDING  Incomplete    Radiology Studies: DG Foot 2 Views Left Result Date: 06/25/2024 CLINICAL DATA:  Postoperative state EXAM: LEFT FOOT - 2 VIEW COMPARISON:  Radiograph dated 05/30/2024. FINDINGS: Amputation of the foot distal to the tarsal bones. No acute fracture or dislocation. Vascular calcifications noted. Cutaneous clips noted over the stump. IMPRESSION: Amputation of the foot distal to the tarsal bones. Electronically Signed   By: Vanetta Chou M.D.   On: 06/25/2024 15:44   Scheduled Meds:  (feeding supplement) PROSource Plus  30 mL Oral BID AC   amitriptyline   10 mg Oral QHS   amLODipine   10 mg Oral q morning   atorvastatin   40 mg Oral Daily   calcium  acetate  667 mg Oral TID WC   Chlorhexidine  Gluconate Cloth  6 each Topical Q0600   Chlorhexidine  Gluconate Cloth  6 each Topical Daily   feeding supplement  237 mL Oral BID BM   heparin   5,000 Units Subcutaneous Q8H   levothyroxine   50 mcg Oral QAC breakfast   metoprolol  tartrate  50 mg Oral BID   multivitamin  1 tablet Oral QHS   mupirocin  ointment  1 Application Nasal BID   nutrition supplement (JUVEN)  1 packet Oral BID BM   pantoprazole   40 mg Oral Daily   thiamine   100 mg Oral Daily   Continuous Infusions:  sodium chloride  75 mL/hr at 06/25/24 0507   anticoagulant sodium citrate      ceFEPime  (MAXIPIME ) IV Stopped (06/25/24 2119)   metronidazole  500 mg (06/26/24 0809)   vancomycin  Stopped (06/24/24 1901)     LOS: 3 days    Time spent: 35 mins    Darcel Dawley, MD [facility]   If  7PM-7AM, please contact night-coverage  "

## 2024-06-26 NOTE — Anesthesia Procedure Notes (Signed)
 Anesthesia Regional Block: Popliteal block   Pre-Anesthetic Checklist: , timeout performed,  Correct Patient, Correct Site, Correct Laterality,  Correct Procedure, Correct Position, site marked,  Risks and benefits discussed,  Surgical consent,  Pre-op  evaluation,  At surgeon's request and post-op pain management  Laterality: Right and Lower  Prep: chloraprep       Needles:  Injection technique: Single-shot      Needle Length: 9cm  Needle Gauge: 22     Additional Needles: Arrow StimuQuik ECHO Echogenic Stimulating PNB Needle  Procedures:,,,, ultrasound used (permanent image in chart),,    Narrative:  Start time: 06/26/2024 10:38 AM End time: 06/26/2024 10:46 AM Injection made incrementally with aspirations every 5 mL.  Performed by: Personally  Anesthesiologist: Leopoldo Bruckner, MD

## 2024-06-26 NOTE — Anesthesia Preprocedure Evaluation (Signed)
 "                                  Anesthesia Evaluation  Patient identified by MRN, date of birth, ID band Patient confused    Reviewed: Allergy & Precautions, NPO status , Patient's Chart, lab work & pertinent test results, Unable to perform ROS - Chart review only  Airway Mallampati: III  TM Distance: >3 FB Neck ROM: Full    Dental  (+) Dental Advisory Given   Pulmonary neg shortness of breath, neg sleep apnea, neg COPD, neg recent URI   breath sounds clear to auscultation       Cardiovascular hypertension, Pt. on medications + Peripheral Vascular Disease and +CHF   Rhythm:Regular  1. Left ventricular ejection fraction, by estimation, is 50 to 55%. The  left ventricle has low normal function. The left ventricle has no regional  wall motion abnormalities. There is mild left ventricular hypertrophy.  Left ventricular diastolic  parameters are consistent with Grade II diastolic dysfunction  (pseudonormalization). Elevated left atrial pressure. The average left  ventricular global longitudinal strain is -19.2 %.   2. Right ventricular systolic function is normal. The right ventricular  size is normal. Tricuspid regurgitation signal is inadequate for assessing  PA pressure.   3. Left atrial size was mildly dilated.   4. The mitral valve is abnormal. Moderate leaflet thickening. Trivial  mitral valve regurgitation.   5. The aortic valve is tricuspid. Aortic valve regurgitation is not  visualized. Aortic valve sclerosis/calcification is present, without any  evidence of aortic stenosis.   6. The inferior vena cava is normal in size with greater than 50%  respiratory variability, suggesting right atrial pressure of 3 mmHg.     Neuro/Psych negative neurological ROS     GI/Hepatic Neg liver ROS,GERD  Medicated,,  Endo/Other  diabetesHypothyroidism    Renal/GU ESRF and DialysisRenal diseaseLab Results      Component                Value               Date                       NA                       134 (L)             06/26/2024                K                        4.4                 06/26/2024                CO2                      22                  06/26/2024                GLUCOSE                  114 (H)             06/26/2024  BUN                      41 (H)              06/26/2024                CREATININE               5.70 (H)            06/26/2024                CALCIUM                   8.9                 06/26/2024                GFRNONAA                 11 (L)              06/26/2024                Musculoskeletal  (+) Arthritis ,    Abdominal   Peds  Hematology  (+) Blood dyscrasia, anemia Lab Results      Component                Value               Date                      WBC                      12.7 (H)            06/26/2024                HGB                      11.9 (L)            06/26/2024                HCT                      35.0 (L)            06/26/2024                MCV                      93.2                06/26/2024                PLT                      315                 06/26/2024              Anesthesia Other Findings   Reproductive/Obstetrics                              Anesthesia Physical Anesthesia Plan  ASA: 4  Anesthesia Plan: MAC and Regional   Post-op Pain Management: Regional block*   Induction: Intravenous  PONV Risk Score and Plan:  1 and Propofol  infusion and Treatment may vary due to age or medical condition  Airway Management Planned: Nasal Cannula, Natural Airway and Simple Face Mask  Additional Equipment: None  Intra-op Plan:   Post-operative Plan:   Informed Consent:      History available from chart only and Consent reviewed with POA  Plan Discussed with: CRNA  Anesthesia Plan Comments:          Anesthesia Quick Evaluation  "

## 2024-06-26 NOTE — Progress Notes (Signed)
 [facility]  Subjective  -patient confused   Objective (!) 167/74 62 98.3 F (36.8 C) (Oral) 17 94%  Intake/Output Summary (Last 24 hours) at 06/26/2024 1003 Last data filed at 06/26/2024 0300 Gross per 24 hour  Intake 1265.22 ml  Output 10 ml  Net 1255.22 ml    Right TMA necrotic  Laboratory Lab Results: Recent Labs    06/24/24 0545 06/25/24 0904 06/26/24 0848  WBC 10.3  --  12.7*  HGB 9.5* 12.2* 10.4*  HCT 31.9* 36.0* 34.1*  PLT 240  --  315   BMET Recent Labs    06/24/24 0545 06/25/24 0904 06/26/24 0848  NA 138 133* 137  K 4.4 5.1 4.5  CL 100 99 97*  CO2 23  --  22  GLUCOSE 125* 95 135*  BUN 33* 29* 39*  CREATININE 6.36* 4.50* 5.38*  CALCIUM  8.5*  --  8.9    COAG Lab Results  Component Value Date   INR 1.2 05/13/2024   INR 1.1 05/19/2023   INR 1.1 03/06/2023   No results found for: PTT  Assessment/Planning:  64 year old male that presents for right below-knee amputation.  Patient is confused today.  We brought his mom down to preop and confirmed with her that it is a right below-knee amputation.  Right TMA is necrotic.  I have also confirmed with Dr. Lanis.  Right leg marked.  Mom will sign the consent.  Lonni JINNY Gaskins 06/26/2024 10:03 AM --

## 2024-06-26 NOTE — Progress Notes (Signed)
" ° ° °  PROCEDURAL EXPEDITER PROGRESS NOTE  Patient Name: [name] [name]  DOB:February 20, 1961 Date of Admission: 06/23/2024  Date of Assessment:06/26/2024   -------------------------------------------------------------------------------------------------------------------   Brief clinical summary: pt is a 64 yr old male going to surgery today for amputation below Knee right.   Orders in place:  Yes   Communication with surgical team if no orders: n/a  Labs, test, and orders reviewed: Yes  Requires surgical clearance:  No  What type of clearance: n/a  Clearance received: n/a  Barriers noted:n/a   Intervention provided by [facility] team: n/a  Barrier resolved:  not applicable   -------------------------------------------------------------------------------------------------------------------  [employer], Ronal DELENA Bald Please contact us  directly via secure chat (search for [facility]) or by calling us  at [phone] [facility]).  "

## 2024-06-26 NOTE — Anesthesia Procedure Notes (Signed)
 Anesthesia Regional Block: Popliteal block   Pre-Anesthetic Checklist: , timeout performed,  Correct Patient, Correct Site, Correct Laterality,  Correct Procedure, Correct Position, site marked,  Risks and benefits discussed,  Surgical consent,  Pre-op  evaluation,  At surgeon's request and post-op pain management  Laterality: Right and Lower  Prep: chloraprep       Needles:  Injection technique: Single-shot      Needle Length: 9cm  Needle Gauge: 22     Additional Needles: Arrow StimuQuik ECHO Echogenic Stimulating PNB Needle  Procedures:,,,, ultrasound used (permanent image in chart),,    Narrative:  Start time: 06/26/2024 10:31 AM End time: 06/26/2024 10:36 AM Injection made incrementally with aspirations every 5 mL.  Performed by: Personally  Anesthesiologist: Leopoldo Bruckner, MD

## 2024-06-26 NOTE — Progress Notes (Signed)
 Pharmacy Antibiotic Note  [name] [name] is a 64 y.o. male admitted on 06/23/2024 with osteomyelitis.  Pharmacy has been consulted for vancomycin  dosing.  Vancomycin  1500mg  x1 loading dose given 1/11. Subsequent vancomycin  750mg  x1 dose given after HD 1/12. Vancomycin  random level this morning 23 within therapeutic range (15-25). Next dose today with HD.   Plan: Vancomycin  750 mg Q-MWF with HD, next dose 1/14 Trend WBC, fever, HD schedule F/u clinical progress, levels as indicated De-escalate when able   Height: 5' 10 (177.8 cm) Weight: 63.6 kg (140 lb 3.4 oz) IBW/kg (Calculated) : 73  Temp (24hrs), Avg:97.7 F (36.5 C), Min:96.8 F (36 C), Max:98.3 F (36.8 C)  Recent Labs  Lab 06/23/24 1340 06/24/24 0545 06/25/24 0904 06/26/24 0848 06/26/24 0944  WBC 10.9* 10.3  --  12.7*  --   CREATININE 5.51* 6.36* 4.50* 5.38* 5.70*  VANCORANDOM  --   --   --  23  --     Estimated Creatinine Clearance: 11.6 mL/min (A) (by C-G formula based on SCr of 5.7 mg/dL (H)).    Allergies[1]  Thank you for allowing pharmacy to be a part of this patients care.  Shelba Collier, PharmD, BCPS Clinical Pharmacist    [1] No Known Allergies

## 2024-06-26 NOTE — Anesthesia Postprocedure Evaluation (Signed)
"   Anesthesia Post Note  Patient: [name] [name]  Procedure(s) Performed: RIGHT BELOW KNEE AMPUTATION (Right: Leg Lower)     Patient location during evaluation: PACU Anesthesia Type: Regional and MAC Level of consciousness: patient cooperative and confused Pain management: pain level controlled Vital Signs Assessment: post-procedure vital signs reviewed and stable Respiratory status: spontaneous breathing, nonlabored ventilation and respiratory function stable Cardiovascular status: blood pressure returned to baseline and stable Postop Assessment: no apparent nausea or vomiting Anesthetic complications: no   No notable events documented.  Last Vitals:  Vitals:   06/26/24 1351 06/26/24 1404  BP: 136/70 137/72  Pulse: (!) 58 (!) 57  Resp: 12 13  Temp:    SpO2: 99% 100%    Last Pain:  Vitals:   06/26/24 1250  TempSrc:   PainSc: Asleep                 Recie Cirrincione      "

## 2024-06-26 NOTE — Progress Notes (Signed)
 OT Cancellation Note  Patient Details Name: [name] [name] MRN: [id number] DOB: June 22, 1960   Cancelled Treatment:    Reason Eval/Treat Not Completed: Patient at procedure or test/ unavailable. Pt in OR this date for R BKA. OT to follow up as appropriate.  Maurilio CROME, OTR/LSABRA  [facility] Acute [facility]  Office: [phone]    Maurilio PARAS Merle Whitehorn 06/26/2024, 10:21 AM

## 2024-06-26 NOTE — Op Note (Signed)
 Date: June 26, 2024  Preoperative diagnosis: Nonhealing right TMA  Postoperative diagnosis: Same  Procedure: Right below-knee amputation  Surgeon: Dr. Lonni DOROTHA Gaskins, MD  Assistant: Adina Sender, PA  Indication: 64 year old male seen with a nonhealing right TMA.  He now presents for right below-knee amputation after risks benefits discussed.  An assistant was needed given the complexity the case and also for performing the amputation and closing the wound.    Findings: Right below-knee amputation with healthy appearing tissue margins and muscle flap.  Anesthesia: General  Details: Patient was taken to the operating room after informed consent was obtained.  Placed on operative table in the supine position.  General endotracheal anesthesia was induced.  The right leg was then prepped and draped in standard sterile fashion.  A timeout was performed and antibiotics were up-to-date.  I went ahead and marked out a incision one handsbreadth below the tibial tuberosity in the right lower extremity.  I then used a suture to mark the circumference of the calf here.  This was cut into two thirds and one third distance.  I then marked out the anterior posterior flap using two thirds distance and the length of the flap using the one third distance for length of the flap.  We then made incision with scalpel and dissected through subcutaneous tissue and fascia with Bovie cautery.  The fibula was transected with bone cutter.  I found the anterior tibial vessels that were ligated over hemostats once these were divided using 2-0 silk suture.  The tibia was divided by my assistant with a Gigli saw.  I then completed the posterior flap with Bovie cautery and ligated the posterior tibial and peroneal arteries and these were transected and oversewn with 2-0 silk suture.  We then copiously irrigated the wound.  We got good hemostasis.  The flap was then reapproximated along the fascia with muscle covering the  bone using 2-0 Vicryl in interrupted fashion until we could not insert a fingertip.  Ultimately the skin was closed with staples.  Dry sterile dressings were applied.  Complication: None  Condition: Stable  Lonni DOROTHA Gaskins, MD [facility] [place] Office: [phone]   Lonni JINNY Gaskins

## 2024-06-27 ENCOUNTER — Encounter (HOSPITAL_COMMUNITY): Payer: Self-pay | Admitting: Vascular Surgery

## 2024-06-27 DIAGNOSIS — I96 Gangrene, not elsewhere classified: Secondary | ICD-10-CM | POA: Diagnosis not present

## 2024-06-27 DIAGNOSIS — Z89511 Acquired absence of right leg below knee: Secondary | ICD-10-CM

## 2024-06-27 LAB — SURGICAL PATHOLOGY

## 2024-06-27 NOTE — Progress Notes (Addendum)
" °  Progress Note    06/27/2024 6:44 AM 1 Day Post-Op  Subjective:  resting in bed  Afebrile  Vitals:   06/26/24 2058 06/27/24 0354  BP: (!) 144/90 125/76  Pulse: 78 65  Resp: 17 17  Temp: 98.6 F (37 C) 98.1 F (36.7 C)  SpO2: 94% 96%    Physical Exam: Incisions:  right BKA bandage in place and clean and dry Extremity: unable to straighten right knee   CBC    Component Value Date/Time   WBC 12.7 (H) 06/26/2024 0848   RBC 3.66 (L) 06/26/2024 0848   HGB 11.9 (L) 06/26/2024 0944   HCT 35.0 (L) 06/26/2024 0944   PLT 315 06/26/2024 0848   MCV 93.2 06/26/2024 0848   MCH 28.4 06/26/2024 0848   MCHC 30.5 06/26/2024 0848   RDW 15.4 06/26/2024 0848   LYMPHSABS 1.0 06/24/2024 0545   MONOABS 0.4 06/24/2024 0545   EOSABS 0.4 06/24/2024 0545   BASOSABS 0.1 06/24/2024 0545    BMET    Component Value Date/Time   NA 134 (L) 06/26/2024 0944   K 4.4 06/26/2024 0944   CL 102 06/26/2024 0944   CO2 22 06/26/2024 0848   GLUCOSE 114 (H) 06/26/2024 0944   BUN 41 (H) 06/26/2024 0944   CREATININE 5.70 (H) 06/26/2024 0944   CALCIUM  8.9 06/26/2024 0848   GFRNONAA 11 (L) 06/26/2024 0848   GFRAA 48 (L) 01/31/2020 1216    INR    Component Value Date/Time   INR 1.2 05/13/2024 1600     Intake/Output Summary (Last 24 hours) at 06/27/2024 0644 Last data filed at 06/26/2024 1801 Gross per 24 hour  Intake 120 ml  Output 2800 ml  Net -2680 ml     Assessment/Plan:  64 y.o. male is s/p right below knee amputation   1 Day Post-Op   -bandage in place and clean and dry.  Plan to remove tomorrow -unfortunately, pt will not or cannot straighten his knee and this puts him at risk for pressure ulceration on the stump.  I did float his leg off the bed.  I discussed with him working on straightening his knee and would benefit from PT.  Discussed with him that if he develops sore on the stump, he is at risk for more proximal amputation.    Lucie Apt, PA-C [facility] [phone] 06/27/2024 6:44 AM   I have seen and evaluated the patient. I agree with the PA note as documented above. Right BKA dressing clean and dry.  IV dilaudid  and oxycodone  for pain control.  Remove dressing tomorrow.  Lonni DOROTHA Gaskins, MD [facility] [place] Office: [phone]         "

## 2024-06-27 NOTE — Progress Notes (Signed)
 OT Cancellation Note  Patient Details Name: [name] [name] MRN: [id number] DOB: Jun 09, 1961   Cancelled Treatment:    Reason Eval/Treat Not Completed: Fatigue/lethargy limiting ability to participate.  RN reports pt has had some confusion and agitation and is now resting comfortably requesting OT try back later if able.   Angeline BROCKS., OTR/L [facility] Office [phone]   Angeline M Vendela Troung 06/27/2024, 12:23 PM

## 2024-06-27 NOTE — Plan of Care (Signed)
" °  Problem: Pain Managment: Goal: General experience of comfort will improve and/or be controlled Outcome: Progressing   Problem: Safety: Goal: Ability to remain free from injury will improve Outcome: Progressing   Problem: Nutritional: Goal: Will attain and maintain optimal nutritional status Outcome: Progressing   Problem: Clinical Measurements: Goal: Postoperative complications will be avoided or minimized Outcome: Progressing   "

## 2024-06-27 NOTE — Progress Notes (Signed)
 " [facility] Progress Note   Subjective:    Seen and examined patient at bedside. Mom at bedside. Has been confused, thinks it's Wednesday therefore yelling that he won't do dialysis tomorrow. Tried to redirect but unsuccessful S/p HD yesterday with net uf 2.5L  Objective Vitals:   06/26/24 1647 06/26/24 2058 06/27/24 0354 06/27/24 0838  BP: (!) 146/65 (!) 144/90 125/76 (!) 144/73  Pulse: 61 78 65 (!) 54  Resp: 18 17 17 18   Temp: 97.7 F (36.5 C) 98.6 F (37 C) 98.1 F (36.7 C) (!) 97.4 F (36.3 C)  TempSrc:  Oral Oral   SpO2: 98% 94% 96% 91%  Weight: 61.4 kg     Height:       Physical Exam General: NAD Head: NCAT sclera not icteric  Lungs: unlabored, normal wob Heart: RRR Abdomen: soft and non-tender  Lower extremities: No LE edema; rt bka Neuro: awake, alert but confused Dialysis Access: R [facility]  Filed Weights   06/26/24 0925 06/26/24 1250 06/26/24 1647  Weight: 66 kg 63.6 kg 61.4 kg    Intake/Output Summary (Last 24 hours) at 06/27/2024 1122 Last data filed at 06/26/2024 1801 Gross per 24 hour  Intake 120 ml  Output 2800 ml  Net -2680 ml    Additional Objective Labs: Basic Metabolic Panel: Recent Labs  Lab 06/23/24 1340 06/24/24 0545 06/25/24 0904 06/26/24 0848 06/26/24 0944  NA 138 138 133* 137 134*  K 3.8 4.4 5.1 4.5 4.4  CL 99 100 99 97* 102  CO2 25 23  --  22  --   GLUCOSE 236* 125* 95 135* 114*  BUN 25* 33* 29* 39* 41*  CREATININE 5.51* 6.36* 4.50* 5.38* 5.70*  CALCIUM  8.6* 8.5*  --  8.9  --   PHOS  --  3.4  --  5.2*  --    Liver Function Tests: Recent Labs  Lab 06/24/24 0545  AST 18  ALT 9  ALKPHOS 131*  BILITOT 0.3  PROT 6.3*  ALBUMIN  2.9*   No results for input(s): LIPASE, AMYLASE in the last 168 hours. CBC: Recent Labs  Lab 06/23/24 1340 06/24/24 0545 06/25/24 0904 06/26/24 0848 06/26/24 0944  WBC 10.9* 10.3  --  12.7*  --   NEUTROABS 8.7* 8.3*  --   --   --   HGB 10.6* 9.5* 12.2* 10.4* 11.9*  HCT  36.2* 31.9* 36.0* 34.1* 35.0*  MCV 98.6 96.7  --  93.2  --   PLT 247 240  --  315  --    Blood Culture    Component Value Date/Time   SDES TISSUE 06/25/2024 1020   SPECREQUEST LEFT FOOT TISSUE 06/25/2024 1020   CULT  06/25/2024 1020    ABUNDANT STAPHYLOCOCCUS AUREUS SUSCEPTIBILITIES PERFORMED ON PREVIOUS CULTURE WITHIN THE LAST 5 DAYS. NO ANAEROBES ISOLATED; CULTURE IN PROGRESS FOR 5 DAYS    REPTSTATUS PENDING 06/25/2024 1020    Cardiac Enzymes: No results for input(s): CKTOTAL, CKMB, CKMBINDEX, TROPONINI in the last 168 hours. CBG: Recent Labs  Lab 06/25/24 0843 06/25/24 1042 06/26/24 0900 06/26/24 0927 06/26/24 1211  GLUCAP 87 94 114* 107* 133*   Iron Studies: No results for input(s): IRON, TIBC, TRANSFERRIN, FERRITIN in the last 72 hours. Lab Results  Component Value Date   INR 1.2 05/13/2024   INR 1.1 05/19/2023   INR 1.1 03/06/2023   Studies/Results: DG Foot 2 Views Left Result Date: 06/25/2024 CLINICAL DATA:  Postoperative state EXAM: LEFT FOOT - 2 VIEW COMPARISON:  Radiograph  dated 05/30/2024. FINDINGS: Amputation of the foot distal to the tarsal bones. No acute fracture or dislocation. Vascular calcifications noted. Cutaneous clips noted over the stump. IMPRESSION: Amputation of the foot distal to the tarsal bones. Electronically Signed   By: Vanetta Chou M.D.   On: 06/25/2024 15:44    Medications:  sodium chloride  75 mL/hr at 06/25/24 0507   anticoagulant sodium citrate      ceFEPime  (MAXIPIME ) IV 1 g (06/26/24 2204)   metronidazole  500 mg (06/27/24 9077)   vancomycin  750 mg (06/26/24 1544)    (feeding supplement) PROSource Plus  30 mL Oral BID AC   amitriptyline   10 mg Oral QHS   amLODipine   10 mg Oral q morning   atorvastatin   40 mg Oral Daily   calcium  acetate  667 mg Oral TID WC   Chlorhexidine  Gluconate Cloth  6 each Topical Q0600   Chlorhexidine  Gluconate Cloth  6 each Topical Daily   feeding supplement  237 mL Oral BID BM    heparin   5,000 Units Subcutaneous Q8H   levothyroxine   50 mcg Oral QAC breakfast   metoprolol  tartrate  50 mg Oral BID   multivitamin  1 tablet Oral QHS   mupirocin  ointment  1 Application Nasal BID   nutrition supplement (JUVEN)  1 packet Oral BID BM   pantoprazole   40 mg Oral Daily   thiamine   100 mg Oral Daily    Dialysis Orders: MWF - [facility] 4hrs, BFR 400, DFR Auto 1.5,  EDW 63.8kg, 3K/ 2.5Ca Mircera 225 mcg q2wks - last 06/21/24  Assessment/Plan: Gangrene left/right foot, s/p b/l TMA - Podiatry and VVS following. S/p left-sided TMA revision 1/13, s/p rt bka 1/14. VVS following ESRD - on HD MWF, tomorrow Hypertension/volume  - UF as tolerated Anemia of CKD - ESA given 1/9 in outpatient, not due yet, hgb 11.9-on target Secondary Hyperparathyroidism -  po4 acceptable Nutrition - Renal diet with fluid restriction Discussed with mom at the bedside  Ephriam Stank, MD [facility] 06/27/2024,11:22 AM  LOS: 4 days    "

## 2024-06-27 NOTE — Progress Notes (Signed)
 " PROGRESS NOTE    GWEN EDLER  FMW:[id number] DOB: [birth year] DOA: 06/23/2024  PCP: Kip Righter, MD   Brief Narrative:  This 64 yrs old Male with PMH significant for poorly controlled type 2 diabetes mellitus, End-stage renal disease on hemodialysis (M/W/F schedule), severe peripheral arterial disease, hyperlipidemia, hypothyroidism, and cardiomyopathy who presents from podiatry clinic with bilateral foot gangrene and nonhealing wounds.  Patient underwent left foot ray amputation on 03/25/24 and Right TMA on 04/26/24.  Despite these intervention and multiple prior revascularization procedures he now presents with dry gangrene/necrotic eschar at the right TMA site and nonhealing wounds at the left TMA site. His vascular history is extensive, including multiple lower extremity angiograms with interventions (01/30/2024, 03/20/2024, 04/16/2024) including peripheral atherectomy, intravascular lithotripsy, and angioplasty. He has bilateral AV grafts for dialysis access (right arm 08/17/2023, left arm 12/27/2022) and currently has a right internal jugular dialysis catheter.  MRI left foot is ordered.  Patient was admitted for further evaluation.  Vascular surgery and podiatry is consulted.  Assessment & Plan:   Principal Problem:   Gangrene of foot (HCC)  Dry gangrene / Necrotic eschar Right TMA site: Left foot nonhealing post TMA : Bilateral Osteomyelitis: Severe PAD / Limb ischemia: Continue empiric antibiotics (Vancomycin , cefepime , Flagyl ) MRI Right Foot > highly suspicious for bone infarcts and osteomyelitis.  Diffuse cellulitis and myofasciitis. MRI left foot > Open wounds along mid metatarsal surgical site, soft tissue inflammation consistent with tissue necrosis,  nonviable tissue.  Osteomyelitis. Patient underwent revision of left foot transmetatarsal to lisfranc amputation.  POD# 2 Patient underwent right BKA by vascular surgery  POD # 1. Nonweightbearing bilateral lower extremity for  now. Bandage in place and clean and dry.  Dressing will be removed tomorrow  End-stage renal disease on hemodialysis: Nephrology is consulted for dialysis coordination. Continue volume status, monitor electrolytes. Continue dialysis as per nephrology. Next Dialysis 06/28/2024  Type 2 diabetes: Hold p.o. diabetic medications Continue regular insulin  sliding scale.  Chronic combined systolic and diastolic CHF: Appears euvolemic on exam. Chest x-ray showed cardiomegaly. Continue volume management with hemodialysis.  Hypothyroidism: Continue levothyroxine .  Essential hypertension: Continue amlodipine . Continue metoprolol .  GERD: Continue pantoprazole .   DVT prophylaxis: Heparin  sq Code Status: Full code Family Communication: No family at bed side Disposition Plan:   Status is: Inpatient Remains inpatient appropriate because: Patient admitted for critical limb ischemia leading to necrotic TMA site.  Started on empiric antibiotics , vascular and podiatry is planning intervention this admission. Patient is not medically ready for discharge yet.    Consultants:  Podiatry Vascular surgery Nephrology  Procedures:  Antimicrobials:  Anti-infectives (From admission, onward)    Start     Dose/Rate Route Frequency Ordered Stop   06/28/24 1200  vancomycin  (VANCOREADY) IVPB 750 mg/150 mL  Status:  Discontinued        750 mg 150 mL/hr over 60 Minutes Intravenous Every M-W-F (Hemodialysis) 06/26/24 1356 06/26/24 1410   06/26/24 1430  vancomycin  (VANCOREADY) IVPB 750 mg/150 mL        750 mg 150 mL/hr over 60 Minutes Intravenous Every M-W-F (Hemodialysis) 06/26/24 1410     06/24/24 1200  vancomycin  (VANCOREADY) IVPB 750 mg/150 mL  Status:  Discontinued        750 mg 150 mL/hr over 60 Minutes Intravenous Every M-W-F (Hemodialysis) 06/23/24 2022 06/26/24 1356   06/23/24 2030  metroNIDAZOLE  (FLAGYL ) IVPB 500 mg        500 mg 100 mL/hr over 60 Minutes Intravenous Every 12 hours  06/23/24 2018     06/23/24 2030  ceFEPIme  (MAXIPIME ) 1 g in sodium chloride  0.9 % 100 mL IVPB        1 g 200 mL/hr over 30 Minutes Intravenous Every 24 hours 06/23/24 2021     06/23/24 2030  vancomycin  (VANCOREADY) IVPB 1500 mg/300 mL        1,500 mg 150 mL/hr over 120 Minutes Intravenous  Once 06/23/24 2022 06/24/24 0152      Subjective: Patient was seen and examined at bed side.  Overnight events noted.  He underwent revision left foot transmetatarsal to lisfranc amputation 06/25/24. He underwent right BKA  06/26/24. POD # 1.  Objective: Vitals:   06/26/24 1647 06/26/24 2058 06/27/24 0354 06/27/24 0838  BP: (!) 146/65 (!) 144/90 125/76 (!) 144/73  Pulse: 61 78 65 (!) 54  Resp: 18 17 17 18   Temp: 97.7 F (36.5 C) 98.6 F (37 C) 98.1 F (36.7 C) (!) 97.4 F (36.3 C)  TempSrc:  Oral Oral   SpO2: 98% 94% 96% 91%  Weight: 61.4 kg     Height:        Intake/Output Summary (Last 24 hours) at 06/27/2024 1157 Last data filed at 06/26/2024 1801 Gross per 24 hour  Intake 120 ml  Output 2800 ml  Net -2680 ml   Filed Weights   06/26/24 0925 06/26/24 1250 06/26/24 1647  Weight: 66 kg 63.6 kg 61.4 kg    Examination:  General exam: Appears calm and comfortable, deconditioned, not in any acute distress. Respiratory system: CTA Bilaterally. Respiratory effort normal.  RR 14 Cardiovascular system: S1 & S2 heard, RRR. No JVD, murmurs, rubs, gallops or clicks.  Gastrointestinal system: Abdomen is non distended, soft and non tender. Normal bowel sounds heard. Central nervous system: Alert and oriented x 2. No focal neurological deficits. Extremities: Right BKA, left TMA site covered with dressing. Skin: No rashes, lesions or ulcers Psychiatry: Judgement and insight appear normal. Mood & affect appropriate.    Data Reviewed: I have personally reviewed following labs and imaging studies  CBC: Recent Labs  Lab 06/23/24 1340 06/24/24 0545 06/25/24 0904 06/26/24 0848 06/26/24 0944   WBC 10.9* 10.3  --  12.7*  --   NEUTROABS 8.7* 8.3*  --   --   --   HGB 10.6* 9.5* 12.2* 10.4* 11.9*  HCT 36.2* 31.9* 36.0* 34.1* 35.0*  MCV 98.6 96.7  --  93.2  --   PLT 247 240  --  315  --    Basic Metabolic Panel: Recent Labs  Lab 06/23/24 1340 06/24/24 0545 06/25/24 0904 06/26/24 0848 06/26/24 0944  NA 138 138 133* 137 134*  K 3.8 4.4 5.1 4.5 4.4  CL 99 100 99 97* 102  CO2 25 23  --  22  --   GLUCOSE 236* 125* 95 135* 114*  BUN 25* 33* 29* 39* 41*  CREATININE 5.51* 6.36* 4.50* 5.38* 5.70*  CALCIUM  8.6* 8.5*  --  8.9  --   MG  --  2.0  --   --   --   PHOS  --  3.4  --  5.2*  --    GFR: Estimated Creatinine Clearance: 11.2 mL/min (A) (by C-G formula based on SCr of 5.7 mg/dL (H)). Liver Function Tests: Recent Labs  Lab 06/24/24 0545  AST 18  ALT 9  ALKPHOS 131*  BILITOT 0.3  PROT 6.3*  ALBUMIN  2.9*   No results for input(s): LIPASE, AMYLASE in the last 168 hours. No  results for input(s): AMMONIA in the last 168 hours. Coagulation Profile: No results for input(s): INR, PROTIME in the last 168 hours. Cardiac Enzymes: No results for input(s): CKTOTAL, CKMB, CKMBINDEX, TROPONINI in the last 168 hours. BNP (last 3 results) No results for input(s): PROBNP in the last 8760 hours. HbA1C: No results for input(s): HGBA1C in the last 72 hours. CBG: Recent Labs  Lab 06/25/24 0843 06/25/24 1042 06/26/24 0900 06/26/24 0927 06/26/24 1211  GLUCAP 87 94 114* 107* 133*   Lipid Profile: No results for input(s): CHOL, HDL, LDLCALC, TRIG, CHOLHDL, LDLDIRECT in the last 72 hours. Thyroid  Function Tests: No results for input(s): TSH, T4TOTAL, FREET4, T3FREE, THYROIDAB in the last 72 hours. Anemia Panel: No results for input(s): VITAMINB12, FOLATE, FERRITIN, TIBC, IRON, RETICCTPCT in the last 72 hours. Sepsis Labs: No results for input(s): PROCALCITON, LATICACIDVEN in the last 168 hours.  Recent Results (from  the past 240 hours)  Blood culture (routine x 2)     Status: None (Preliminary result)   Collection Time: 06/23/24  4:48 PM   Specimen: BLOOD  Result Value Ref Range Status   Specimen Description BLOOD SITE NOT SPECIFIED  Final   Special Requests   Final    BOTTLES DRAWN AEROBIC AND ANAEROBIC Blood Culture results may not be optimal due to an inadequate volume of blood received in culture bottles   Culture   Final    NO GROWTH 4 DAYS Performed at [facility], [address], [place], [place] [address]    Report Status PENDING  Incomplete  Blood culture (routine x 2)     Status: None (Preliminary result)   Collection Time: 06/23/24  4:53 PM   Specimen: BLOOD  Result Value Ref Range Status   Specimen Description BLOOD SITE NOT SPECIFIED  Final   Special Requests   Final    BOTTLES DRAWN AEROBIC AND ANAEROBIC Blood Culture results may not be optimal due to an inadequate volume of blood received in culture bottles   Culture   Final    NO GROWTH 4 DAYS Performed at [facility], [address] [place], [place], [place] [address]    Report Status PENDING  Incomplete  Surgical pcr screen     Status: Abnormal   Collection Time: 06/25/24  2:26 AM   Specimen: Nasal Mucosa; Nasal Swab  Result Value Ref Range Status   MRSA, PCR POSITIVE (A) NEGATIVE Final   Staphylococcus aureus POSITIVE (A) NEGATIVE Final    Comment: RESULT CALLED TO, READ BACK BY AND VERIFIED WITH: A LYNPHACUM RN 06/25/2024 @ 0414 BY AB (NOTE) The Xpert SA Assay (FDA approved for NASAL specimens in patients 3 years of age and older), is one component of a comprehensive surveillance program. It is not intended to diagnose infection nor to guide or monitor treatment. Performed at [facility], [address] [place], [place] [address]   Aerobic/Anaerobic Culture w Gram Stain (surgical/deep wound)     Status: None (Preliminary result)   Collection Time: 06/25/24 10:19 AM   Specimen: PATH Soft tissue  Result  Value Ref Range Status   Specimen Description TISSUE  Final   Special Requests LEFT FOOT ABSCESS  Final   Gram Stain   Final    NO WBC SEEN RARE GRAM POSITIVE COCCI Performed at [facility], [address], [place], [place] [address]    Culture   Final    MODERATE METHICILLIN RESISTANT STAPHYLOCOCCUS AUREUS NO ANAEROBES ISOLATED; CULTURE IN  PROGRESS FOR 5 DAYS    Report Status PENDING  Incomplete   Organism ID, Bacteria METHICILLIN RESISTANT STAPHYLOCOCCUS AUREUS  Final      Susceptibility   Methicillin resistant staphylococcus aureus - MIC*    CIPROFLOXACIN >=8 RESISTANT Resistant     ERYTHROMYCIN >=8 RESISTANT Resistant     GENTAMICIN <=0.5 SENSITIVE Sensitive     OXACILLIN >=4 RESISTANT Resistant     TETRACYCLINE <=1 SENSITIVE Sensitive     VANCOMYCIN  1 SENSITIVE Sensitive     TRIMETH/SULFA >=320 RESISTANT Resistant     CLINDAMYCIN <=0.25 SENSITIVE Sensitive     RIFAMPIN <=0.5 SENSITIVE Sensitive     Inducible Clindamycin NEGATIVE Sensitive     LINEZOLID 2 SENSITIVE Sensitive     * MODERATE METHICILLIN RESISTANT STAPHYLOCOCCUS AUREUS  Aerobic/Anaerobic Culture w Gram Stain (surgical/deep wound)     Status: None (Preliminary result)   Collection Time: 06/25/24 10:20 AM   Specimen: PATH Soft tissue  Result Value Ref Range Status   Specimen Description TISSUE  Final   Special Requests LEFT FOOT TISSUE  Final   Gram Stain   Final    NO WBC SEEN ABUNDANT GRAM POSITIVE COCCI Performed at [facility], [address], [place], [place] [address]    Culture   Final    ABUNDANT STAPHYLOCOCCUS AUREUS SUSCEPTIBILITIES PERFORMED ON PREVIOUS CULTURE WITHIN THE LAST 5 DAYS. NO ANAEROBES ISOLATED; CULTURE IN PROGRESS FOR 5 DAYS    Report Status PENDING  Incomplete    Radiology Studies: No results found.  Scheduled Meds:  (feeding supplement) PROSource Plus  30 mL Oral BID AC   amitriptyline   10 mg Oral QHS   amLODipine   10 mg Oral q morning   atorvastatin   40 mg  Oral Daily   calcium  acetate  667 mg Oral TID WC   Chlorhexidine  Gluconate Cloth  6 each Topical Q0600   Chlorhexidine  Gluconate Cloth  6 each Topical Daily   feeding supplement  237 mL Oral BID BM   heparin   5,000 Units Subcutaneous Q8H   levothyroxine   50 mcg Oral QAC breakfast   metoprolol  tartrate  50 mg Oral BID   multivitamin  1 tablet Oral QHS   mupirocin  ointment  1 Application Nasal BID   nutrition supplement (JUVEN)  1 packet Oral BID BM   pantoprazole   40 mg Oral Daily   thiamine   100 mg Oral Daily   Continuous Infusions:  sodium chloride  75 mL/hr at 06/25/24 0507   anticoagulant sodium citrate      ceFEPime  (MAXIPIME ) IV 1 g (06/26/24 2204)   metronidazole  500 mg (06/27/24 0922)   vancomycin  750 mg (06/26/24 1544)     LOS: 4 days    Time spent: 35 mins    Darcel Dawley, MD [facility]   If 7PM-7AM, please contact night-coverage  "

## 2024-06-27 NOTE — Progress Notes (Signed)
 Occupational Therapy Evaluation Patient Details Name: [name] [name] MRN: [id number] DOB: 10/20/1960 Today's Date: 06/27/2024   History of Present Illness   64 yrs old Male with  who presents from podiatry clinic with bilateral foot gangrene and nonhealing wounds.   Patient underwent left foot ray amputation on 03/25/24 and Right TMA on 04/26/24.  Despite these intervention and multiple prior revascularization procedures he now presents with dry gangrene/necrotic eschar at the right TMA site and nonhealing wounds at the left TMA site.  MRI Right Foot > highly suspicious for bone infarcts and osteomyelitis.  Diffuse cellulitis and myofasciitis.  MRI left foot > Open wounds along mid metatarsal surgical site, soft tissue inflammation consistent with tissue necrosis,  nonviable tissue.  Osteomyelitis.  Patient underwent revision of left foot transmetatarsal to lisfranc amputation. 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 Pt admitted with above. He demonstrates the below listed deficits and will benefit from continued OT to maximize safety and independence with BADLs.  He presents with pain, confusion, impaired activity tolerance, generalized weakness, decreased independence with ADLs and functional mobility.  PTA, pt lived with his mother and was able to pivot to w/c with min A.  He required mod - max A with ADLs       If plan is discharge home, recommend the following:   A lot of help with bathing/dressing/bathroom;Two people to help with walking and/or transfers;Assistance with cooking/housework;Direct supervision/assist for medications management;Direct  supervision/assist for financial management;Assist for transportation;Help with stairs or ramp for entrance     Functional Status Assessment   Patient has had a recent decline in their functional status and demonstrates the ability to make significant improvements in function in a reasonable and predictable amount of time.     Equipment Recommendations   None recommended by OT     Recommendations for Other Services         Precautions/Restrictions   Precautions Precautions: Fall Restrictions Weight Bearing Restrictions Per Provider Order: Yes RLE Weight Bearing Per Provider Order: Non weight bearing LLE Weight Bearing Per Provider Order: Non weight bearing     Mobility Bed Mobility Overal bed mobility: Needs Assistance Bed Mobility: Rolling Rolling: Min assist, Used rails (max verbal and gestural cues)         General bed mobility comments: unable to progress to EOB due to increased pain and pt confusion    Transfers                          Balance                                           ADL either performed or assessed with clinical judgement   ADL Overall ADL's : Needs assistance/impaired Eating/Feeding: Minimal assistance   Grooming: Wash/dry hands;Wash/dry face;Oral care;Brushing hair;Maximal assistance   Upper Body Bathing: Total assistance;Bed level   Lower Body Bathing: Total assistance;Bed level   Upper Body Dressing : Total assistance;Bed level   Lower Body Dressing: Total assistance;Bed level   Toilet Transfer: Total assistance Toilet Transfer Details (indicate cue type and  reason): unable Toileting- Clothing Manipulation and Hygiene: Total assistance       Functional mobility during ADLs: Total assistance (unable)       Vision Patient Visual Report: No change from baseline       Perception         Praxis         Pertinent Vitals/Pain Pain Assessment Pain Assessment: Faces Pain Score: 8   Faces Pain Scale: Hurts whole lot Pain Location: bil. LEs unable to accurately determine which hurts worse Pain Descriptors / Indicators: Grimacing, Moaning, Operative site guarding, Restless, Crying Pain Intervention(s): Limited activity within patient's tolerance, Repositioned, Utilized relaxation techniques, Premedicated before session     Extremity/Trunk Assessment Upper Extremity Assessment Upper Extremity Assessment: Generalized weakness   Lower Extremity Assessment Lower Extremity Assessment: Defer to PT evaluation (dressing and ace wraps intact bil. LEs.  Pt moving bil. LEs spontaneously.   Rt LE in knee extension at end of session)   Cervical / Trunk Assessment Cervical / Trunk Assessment: Normal   Communication Communication Communication: No apparent difficulties   Cognition Arousal: Alert Behavior During Therapy: Restless Cognition: Cognition impaired   Orientation impairments: Situation, Time, Place Awareness: Intellectual awareness impaired, Online awareness impaired   Attention impairment (select first level of impairment): Focused attention                     Following commands: Impaired Following commands impaired: Follows one step commands inconsistently     Cueing  General Comments   Cueing Techniques: Verbal cues;Gestural cues;Tactile cues  Mother present   Exercises     Shoulder Instructions      Home Living Family/patient expects to be discharged to:: Private residence Living Arrangements: Parent Available Help at Discharge: Family;Available 24 hours/day Type of Home: Mobile home Home Access: Ramped entrance     Home Layout: One level     Bathroom Shower/Tub: Sponge bathes at baseline   Bathroom Toilet:  (utilizes [facility])     Home Equipment: Shower seat;Cane - single Librarian, Academic (2 wheels);Wheelchair - manual;Wheelchair - power;Grab bars - toilet;BSC/3in1   Additional Comments: pt lives with 73 y.o. mother       Prior Functioning/Environment Prior Level of Function : Needs assist       Physical Assist : Mobility (physical)     Mobility Comments: w/c at baseline, self propels vs being pushed ADLs Comments: mother provides max A - total A for bathing and dressing at bed level.  Pt transfers to [facility] with min A from mother and max A for toileitng    OT Problem List: Decreased strength;Decreased activity tolerance;Impaired balance (sitting and/or standing);Decreased cognition;Decreased safety awareness;Decreased knowledge of use of DME or AE;Decreased knowledge of precautions;Pain   OT Treatment/Interventions: Self-care/ADL training;Therapeutic exercise;DME and/or AE instruction;Therapeutic activities;Cognitive remediation/compensation;Balance training;Patient/family education      OT Goals(Current goals can be found in the care plan section)   Acute Rehab OT Goals Patient Stated Goal: To improve pain OT Goal Formulation: With patient/family Time For Goal Achievement: 06/27/24 Potential to Achieve Goals: Good   OT Frequency:  Min 2X/week    Co-evaluation              AM-PAC OT 6 Clicks Daily Activity     Outcome Measure Help from another person eating meals?: A Little Help from another person taking care of personal grooming?: Total Help from another person toileting, which includes using toliet, bedpan, or urinal?: Total Help from another person bathing (including washing, rinsing, drying)?:  Total Help from another person to put on and taking off regular upper body clothing?: Total Help from another person to put on and taking off regular lower body clothing?: Total 6 Click Score: 8   End of Session Nurse Communication: Mobility status  Activity Tolerance: Patient limited by pain (cognitive deficits/confusion) Patient left: in bed;with call bell/phone within reach;with bed alarm set;with family/visitor present  OT Visit Diagnosis: Unsteadiness on feet (R26.81);Muscle weakness  (generalized) (M62.81);Other symptoms and signs involving cognitive function;Pain Pain - Right/Left: Right Pain - part of body: Leg (bil. LEs)                Time: 8684-8670 OT Time Calculation (min): 14 min Charges:  OT General Charges $OT Visit: 1 Visit OT Evaluation $OT Eval Moderate Complexity: 1 Mod  Cyrstal Leitz C., OTR/L Acute Rehabilitation Services Office [phone]   [name] [name] 06/27/2024, 1:55 PM

## 2024-06-27 NOTE — Progress Notes (Signed)
"  °  Subjective:  Patient ID: [name] [name], male    DOB: 1961-05-14,  MRN: [id number]  Chief Complaint  Patient presents with   Foot Pain    DOS: 06/25/2024 Procedure: 1. Revision left foot transmetatarsal to lisfranc amputation   64 y.o. male seen for post op check.  Patient seen for dressing change.  He was sleeping when I came in the room.  He woke up said he did not want a change dressing after some discussion he allowed me to change the dressing.  Says is having pain on his legs unclear to me which 1 is hurting him and worse the right or the left foot.  Discussed how the amputation site is looking points for dressing changes and follow-up  Review of Systems: Negative except as noted in the HPI. Denies N/V/F/Ch.   Objective:   Constitutional Well developed. Well nourished.  Vascular Foot warm and well perfused. Capillary refill normal to all digits.   No calf pain with palpation  Neurologic Normal speech. Oriented to person, place, and time. Epicritic sensation diminished to left foot  Dermatologic Amputation site well coapted no drainage dehiscence necrosis at this time appears viable.      Orthopedic: Status post left foot Lisfranc amputation   Radiographs: Amputation of the foot distal to the tarsal bones.   Pathology: Pending  Micro: MRSA  Assessment:   1. Gangrene (HCC)   Status post left foot revision TMA to Lisfranc amputation  Plan:  Patient was evaluated and treated and all questions answered.  POD # 2 s/p left foot Lisfranc amputation -Progressing well postoperatively in regards to the left foot amputation site.  No evidence of necrosis or dehiscence at this time appears to be healing appropriately early on.  Still at risk for late necrosis and dehiscence given his microvascular disease. -Status post right lower extremity below-knee amputation -XR: Expected postoperative findings -WB Status: Nonweightbearing in soft dressing.  Recommend Prevalon boot for  the left side while in bed -Sutures: Leave sutures and staples intact at least 2 probably 3 to 4 weeks. -Medications/ABX: No regards to antibiotics on the left side recommend a short course of p.o. antibiotics at this point no evidence of residual infection -Dressing: Changed today and placed new Xeroform 4 x 4 Kerlix Ace wrap dressing to the left foot.  Recommend twice weekly dressing changes with the same Monday and Thursday.  Orders entered. - F/u Plan: Patient will follow-up in 2 weeks in the office he is stable for discharge to likely a nursing facility when arranged.  will sign off at this time.  Please notify me via secure chat if any issues and events significant.         Marolyn JULIANNA Honour, DPM [facility] / [facility]  "

## 2024-06-28 DIAGNOSIS — I96 Gangrene, not elsewhere classified: Secondary | ICD-10-CM | POA: Diagnosis not present

## 2024-06-28 LAB — RENAL FUNCTION PANEL
Albumin: 2.9 g/dL — ABNORMAL LOW (ref 3.5–5.0)
Anion gap: 14 (ref 5–15)
BUN: 39 mg/dL — ABNORMAL HIGH (ref 8–23)
CO2: 23 mmol/L (ref 22–32)
Calcium: 9.1 mg/dL (ref 8.9–10.3)
Chloride: 95 mmol/L — ABNORMAL LOW (ref 98–111)
Creatinine, Ser: 5.08 mg/dL — ABNORMAL HIGH (ref 0.61–1.24)
GFR, Estimated: 12 mL/min — ABNORMAL LOW
Glucose, Bld: 136 mg/dL — ABNORMAL HIGH (ref 70–99)
Phosphorus: 3.3 mg/dL (ref 2.5–4.6)
Potassium: 4.1 mmol/L (ref 3.5–5.1)
Sodium: 132 mmol/L — ABNORMAL LOW (ref 135–145)

## 2024-06-28 LAB — CULTURE, BLOOD (ROUTINE X 2)
Culture: NO GROWTH
Culture: NO GROWTH

## 2024-06-28 LAB — VITAMIN A: Vitamin A (Retinoic Acid): 31.3 ug/dL (ref 22.0–69.5)

## 2024-06-28 LAB — VITAMIN E
Vitamin E (Alpha Tocopherol): 8.2 mg/L — ABNORMAL LOW (ref 9.0–29.0)
Vitamin E(Gamma Tocopherol): 1.3 mg/L (ref 0.5–4.9)

## 2024-06-28 LAB — ZINC: Zinc: 66 ug/dL (ref 44–115)

## 2024-06-28 MED ORDER — PENTAFLUOROPROP-TETRAFLUOROETH EX AERO: 1.0000 | INHALATION_SPRAY | CUTANEOUS | Status: DC | PRN

## 2024-06-28 MED ORDER — PHENOL 1.4 % MT LIQD: 1.0000 | OROMUCOSAL | Status: DC | PRN

## 2024-06-28 MED ORDER — DOXYCYCLINE HYCLATE 100 MG PO TABS
100.0000 mg | ORAL_TABLET | Freq: Two times a day (BID) | ORAL | Status: AC
  Administered 2024-06-28 – 2024-07-02 (×10): 100 mg via ORAL
  Filled 2024-06-28 (×10): qty 1

## 2024-06-28 NOTE — Progress Notes (Signed)
 " PROGRESS NOTE    [name] [name]  FMW:[id number] DOB: 02-25-1961 DOA: 06/23/2024  PCP: Kip Righter, MD   Brief Narrative:  This 64 yrs old Male with PMH significant for poorly controlled type 2 diabetes mellitus, End-stage renal disease on hemodialysis (M/W/F schedule), severe peripheral arterial disease, hyperlipidemia, hypothyroidism, and cardiomyopathy who presents from podiatry clinic with bilateral foot gangrene and nonhealing wounds.  Patient underwent left foot ray amputation on 03/25/24 and Right TMA on 04/26/24.  Despite these intervention and multiple prior revascularization procedures he now presents with dry gangrene/necrotic eschar at the right TMA site and nonhealing wounds at the left TMA site. His vascular history is extensive, including multiple lower extremity angiograms with interventions (01/30/2024, 03/20/2024, 04/16/2024) including peripheral atherectomy, intravascular lithotripsy, and angioplasty. He has bilateral AV grafts for dialysis access (right arm 08/17/2023, left arm 12/27/2022) and currently has a right internal jugular dialysis catheter.  MRI left foot is ordered.  Patient was admitted for further evaluation.  Vascular surgery and podiatry is consulted.  Assessment & Plan:   Principal Problem:   Gangrene of foot (HCC)  Dry gangrene / Necrotic eschar Right TMA site: Left foot nonhealing post TMA : Bilateral Osteomyelitis: Severe PAD / Limb ischemia: Continue empiric antibiotics (Vancomycin , cefepime , Flagyl ) MRI Right Foot > highly suspicious for bone infarcts and osteomyelitis.  Diffuse cellulitis and myofasciitis. MRI left foot > Open wounds along mid metatarsal surgical site, soft tissue inflammation consistent with tissue necrosis,  nonviable tissue.  Osteomyelitis. Patient underwent revision of left foot transmetatarsal to lisfranc amputation.  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 Nonweightbearing bilateral lower extremity for  now. Dressing removed and incision looks good.  Daily dressing changes recommended.  Follow-up outpatient in 4 to 6 weeks for staple removal  End-stage renal disease on hemodialysis: Nephrology is consulted for dialysis coordination. Monitor volume status, monitor electrolytes. Continue dialysis as per nephrology. Next Dialysis today  Type 2 diabetes: Hold p.o. diabetic medications Continue regular insulin  sliding scale.  Chronic combined systolic and diastolic CHF: Appears euvolemic on exam. Chest x-ray showed cardiomegaly. Continue volume management with hemodialysis.  Hypothyroidism: Continue levothyroxine .  Essential hypertension: Continue amlodipine . Continue metoprolol .  GERD: Continue pantoprazole .   DVT prophylaxis: Heparin  sq Code Status: Full code Family Communication: No family at bed side Disposition Plan:   Status is: Inpatient Remains inpatient appropriate because: Patient admitted for critical limb ischemia leading to necrotic TMA site.  Started on empiric antibiotics , vascular and podiatry is planning intervention this admission. Patient is not medically ready for discharge yet.    Consultants:  Podiatry Vascular surgery Nephrology  Procedures:  Antimicrobials:  Anti-infectives (From admission, onward)    Start     Dose/Rate Route Frequency Ordered Stop   06/28/24 1200  vancomycin  (VANCOREADY) IVPB 750 mg/150 mL  Status:  Discontinued        750 mg 150 mL/hr over 60 Minutes Intravenous Every M-W-F (Hemodialysis) 06/26/24 1356 06/26/24 1410   06/28/24 1000  doxycycline  (VIBRA -TABS) tablet 100 mg        100 mg Oral Every 12 hours 06/28/24 0852 07/03/24 0959   06/26/24 1430  vancomycin  (VANCOREADY) IVPB 750 mg/150 mL  Status:  Discontinued        750 mg 150 mL/hr over 60 Minutes Intravenous Every M-W-F (Hemodialysis) 06/26/24 1410 06/28/24 0852   06/24/24 1200  vancomycin  (VANCOREADY) IVPB 750 mg/150 mL  Status:  Discontinued        750 mg 150  mL/hr over 60 Minutes Intravenous Every M-W-F (Hemodialysis) 06/23/24 2022 06/26/24 1356   06/23/24 2030  metroNIDAZOLE  (FLAGYL ) IVPB 500 mg  Status:  Discontinued        500 mg 100 mL/hr over 60 Minutes Intravenous Every 12 hours 06/23/24 2018 06/28/24 0852   06/23/24 2030  ceFEPIme  (MAXIPIME ) 1 g in sodium chloride  0.9 % 100 mL IVPB  Status:  Discontinued        1 g 200 mL/hr over 30 Minutes Intravenous Every 24 hours 06/23/24 2021 06/28/24 0852   06/23/24 2030  vancomycin  (VANCOREADY) IVPB 1500 mg/300 mL        1,500 mg 150 mL/hr over 120 Minutes Intravenous  Once 06/23/24 2022 06/24/24 0152      Subjective: Patient was seen and examined at bed side. Overnight events noted.  He underwent revision left foot transmetatarsal to lisfranc amputation 06/25/24. He underwent right BKA  06/26/24. POD # 2.  Dressing removed.  Wound looks good.   Patient is going to have dialysis today  Objective: Vitals:   06/26/24 1647 06/28/24 0300 06/28/24 0740 06/28/24 1220  BP:  (!) 158/76 (!) 166/86 (!) 150/74  Pulse:  69 82 65  Resp:  16 16 16   Temp:  98.5 F (36.9 C) (!) 97.4 F (36.3 C) 98.3 F (36.8 C)  TempSrc:  Axillary Oral Oral  SpO2:  99% 98% 97%  Weight: 61.4 kg     Height:        Intake/Output Summary (Last 24 hours) at 06/28/2024 1258 Last data filed at 06/28/2024 0500 Gross per 24 hour  Intake 940 ml  Output --  Net 940 ml   Filed Weights   06/26/24 0925 06/26/24 1250 06/26/24 1647  Weight: 66 kg 63.6 kg 61.4 kg    Examination:  General exam: Appears calm and comfortable, deconditioned, not in any acute distress. Respiratory system: CTA Bilaterally. Respiratory effort normal.  RR 13 Cardiovascular system: S1 & S2 heard, RRR. No JVD, murmurs, rubs, gallops or clicks.  Gastrointestinal system: Abdomen is non distended, soft and non tender. Normal bowel sounds heard. Central nervous system: Alert and oriented x 2. No focal neurological deficits. Extremities: Right BKA, left  TMA site covered with dressing. Skin: No rashes, lesions or ulcers Psychiatry: Judgement and insight appear normal. Mood & affect appropriate.    Data Reviewed: I have personally reviewed following labs and imaging studies  CBC: Recent Labs  Lab 06/23/24 1340 06/24/24 0545 06/25/24 0904 06/26/24 0848 06/26/24 0944  WBC 10.9* 10.3  --  12.7*  --   NEUTROABS 8.7* 8.3*  --   --   --   HGB 10.6* 9.5* 12.2* 10.4* 11.9*  HCT 36.2* 31.9* 36.0* 34.1* 35.0*  MCV 98.6 96.7  --  93.2  --   PLT 247 240  --  315  --    Basic Metabolic Panel: Recent Labs  Lab 06/23/24 1340 06/24/24 0545 06/25/24 0904 06/26/24 0848 06/26/24 0944  NA 138 138 133* 137 134*  K 3.8 4.4 5.1 4.5 4.4  CL 99 100 99 97* 102  CO2 25 23  --  22  --   GLUCOSE 236* 125* 95 135* 114*  BUN 25* 33* 29* 39* 41*  CREATININE 5.51* 6.36* 4.50* 5.38* 5.70*  CALCIUM  8.6* 8.5*  --  8.9  --   MG  --  2.0  --   --   --   PHOS  --  3.4  --  5.2*  --  GFR: Estimated Creatinine Clearance: 11.2 mL/min (A) (by C-G formula based on SCr of 5.7 mg/dL (H)). Liver Function Tests: Recent Labs  Lab 06/24/24 0545  AST 18  ALT 9  ALKPHOS 131*  BILITOT 0.3  PROT 6.3*  ALBUMIN  2.9*   No results for input(s): LIPASE, AMYLASE in the last 168 hours. No results for input(s): AMMONIA in the last 168 hours. Coagulation Profile: No results for input(s): INR, PROTIME in the last 168 hours. Cardiac Enzymes: No results for input(s): CKTOTAL, CKMB, CKMBINDEX, TROPONINI in the last 168 hours. BNP (last 3 results) No results for input(s): PROBNP in the last 8760 hours. HbA1C: No results for input(s): HGBA1C in the last 72 hours. CBG: Recent Labs  Lab 06/25/24 0843 06/25/24 1042 06/26/24 0900 06/26/24 0927 06/26/24 1211  GLUCAP 87 94 114* 107* 133*   Lipid Profile: No results for input(s): CHOL, HDL, LDLCALC, TRIG, CHOLHDL, LDLDIRECT in the last 72 hours. Thyroid  Function Tests: No results  for input(s): TSH, T4TOTAL, FREET4, T3FREE, THYROIDAB in the last 72 hours. Anemia Panel: No results for input(s): VITAMINB12, FOLATE, FERRITIN, TIBC, IRON, RETICCTPCT in the last 72 hours. Sepsis Labs: No results for input(s): PROCALCITON, LATICACIDVEN in the last 168 hours.  Recent Results (from the past 240 hours)  Blood culture (routine x 2)     Status: None   Collection Time: 06/23/24  4:48 PM   Specimen: BLOOD  Result Value Ref Range Status   Specimen Description BLOOD SITE NOT SPECIFIED  Final   Special Requests   Final    BOTTLES DRAWN AEROBIC AND ANAEROBIC Blood Culture results may not be optimal due to an inadequate volume of blood received in culture bottles   Culture   Final    NO GROWTH 5 DAYS Performed at [facility], [address], [place], [place] [address]    Report Status 06/28/2024 FINAL  Final  Blood culture (routine x 2)     Status: None   Collection Time: 06/23/24  4:53 PM   Specimen: BLOOD  Result Value Ref Range Status   Specimen Description BLOOD SITE NOT SPECIFIED  Final   Special Requests   Final    BOTTLES DRAWN AEROBIC AND ANAEROBIC Blood Culture results may not be optimal due to an inadequate volume of blood received in culture bottles   Culture   Final    NO GROWTH 5 DAYS Performed at [facility], [address] [place], [place] [address]    Report Status 06/28/2024 FINAL  Final  Surgical pcr screen     Status: Abnormal   Collection Time: 06/25/24  2:26 AM   Specimen: Nasal Mucosa; Nasal Swab  Result Value Ref Range Status   MRSA, PCR POSITIVE (A) NEGATIVE Final   Staphylococcus aureus POSITIVE (A) NEGATIVE Final    Comment: RESULT CALLED TO, READ BACK BY AND VERIFIED WITH: A LYNPHACUM RN 06/25/2024 @ 0414 BY AB (NOTE) The Xpert SA Assay (FDA approved for NASAL specimens in patients 31 years of age and older), is one component of a comprehensive surveillance program. It is not intended to diagnose  infection nor to guide or monitor treatment. Performed at [facility], [address], [place], [place] [address]   Aerobic/Anaerobic Culture w Gram Stain (surgical/deep wound)     Status: None (Preliminary result)   Collection Time: 06/25/24 10:19 AM   Specimen: PATH Soft tissue  Result Value Ref Range Status   Specimen Description TISSUE  Final   Special  Requests LEFT FOOT ABSCESS  Final   Gram Stain   Final    NO WBC SEEN RARE GRAM POSITIVE COCCI Performed at [facility], [address] [place], [place] [address]    Culture   Final    MODERATE METHICILLIN RESISTANT STAPHYLOCOCCUS AUREUS NO ANAEROBES ISOLATED; CULTURE IN PROGRESS FOR 5 DAYS    Report Status PENDING  Incomplete   Organism ID, Bacteria METHICILLIN RESISTANT STAPHYLOCOCCUS AUREUS  Final      Susceptibility   Methicillin resistant staphylococcus aureus - MIC*    CIPROFLOXACIN >=8 RESISTANT Resistant     ERYTHROMYCIN >=8 RESISTANT Resistant     GENTAMICIN <=0.5 SENSITIVE Sensitive     OXACILLIN >=4 RESISTANT Resistant     TETRACYCLINE <=1 SENSITIVE Sensitive     VANCOMYCIN  1 SENSITIVE Sensitive     TRIMETH/SULFA >=320 RESISTANT Resistant     CLINDAMYCIN <=0.25 SENSITIVE Sensitive     RIFAMPIN <=0.5 SENSITIVE Sensitive     Inducible Clindamycin NEGATIVE Sensitive     LINEZOLID 2 SENSITIVE Sensitive     * MODERATE METHICILLIN RESISTANT STAPHYLOCOCCUS AUREUS  Aerobic/Anaerobic Culture w Gram Stain (surgical/deep wound)     Status: None (Preliminary result)   Collection Time: 06/25/24 10:20 AM   Specimen: PATH Soft tissue  Result Value Ref Range Status   Specimen Description TISSUE  Final   Special Requests LEFT FOOT TISSUE  Final   Gram Stain NO WBC SEEN ABUNDANT GRAM POSITIVE COCCI   Final   Culture   Final    ABUNDANT STAPHYLOCOCCUS AUREUS RARE PSEUDOMONAS FLUORESCENS CRITICAL RESULT CALLED TO, READ BACK BY AND VERIFIED WITH: RN ANGEL M [id number] [facility] SUSCEPTIBILITIES PERFORMED ON PREVIOUS  CULTURE WITHIN THE LAST 5 DAYS. STAPHYLOCOCCUS AUREUS CULTURE REINCUBATED FOR BETTER GROWTH Performed at [facility], [address] [place], [place] [address]    Report Status PENDING  Incomplete    Radiology Studies: No results found.  Scheduled Meds:  (feeding supplement) PROSource Plus  30 mL Oral BID AC   amitriptyline   10 mg Oral QHS   amLODipine   10 mg Oral q morning   atorvastatin   40 mg Oral Daily   calcium  acetate  667 mg Oral TID WC   Chlorhexidine  Gluconate Cloth  6 each Topical Q0600   Chlorhexidine  Gluconate Cloth  6 each Topical Daily   doxycycline   100 mg Oral Q12H   feeding supplement  237 mL Oral BID BM   heparin   5,000 Units Subcutaneous Q8H   levothyroxine   50 mcg Oral QAC breakfast   metoprolol  tartrate  50 mg Oral BID   multivitamin  1 tablet Oral QHS   mupirocin  ointment  1 Application Nasal BID   nutrition supplement (JUVEN)  1 packet Oral BID BM   pantoprazole   40 mg Oral Daily   thiamine   100 mg Oral Daily   Continuous Infusions:  sodium chloride  75 mL/hr at 06/25/24 0507   anticoagulant sodium citrate        LOS: 5 days    Time spent: 35 mins    Darcel Dawley, MD [facility]   If 7PM-7AM, please contact night-coverage  "

## 2024-06-28 NOTE — Evaluation (Signed)
 Physical Therapy Evaluation Patient Details Name: [name] [name] MRN: [id number] DOB: 05-Jul-1960 Today's Date: 06/28/2024  History of Present Illness  64 yrs old Male with  who presents from podiatry clinic with bilateral foot gangrene and nonhealing wounds.   Patient underwent left foot ray amputation on 03/25/24 and Right TMA on 04/26/24.  Despite these intervention and multiple prior revascularization procedures he now presents with dry gangrene/necrotic eschar at the right TMA site and nonhealing wounds at the left TMA site.  MRI Right Foot > highly suspicious for bone infarcts and osteomyelitis.  Diffuse cellulitis and myofasciitis.  MRI left foot > Open wounds along mid metatarsal surgical site, soft tissue inflammation consistent with tissue necrosis,  nonviable tissue.  Osteomyelitis.  Patient underwent revision of left foot transmetatarsal to lisfranc amputation. 06/26/23  Patient underwent right BKA by vascular surgery1/14/25      PMH significant for poorly controlled type 2 diabetes mellitus, End-stage renal disease on hemodialysis (M/W/F schedule), severe peripheral arterial disease, hyperlipidemia, hypothyroidism, and cardiomyopathy  Clinical Impression  PTA, pt was living with his mother in a [facility] with ramped entrance and she provided assistance with mobility intermittently, ADLs, and all IADLs. He currently is significantly limited by pain and unable to tolerate low level mobility tasks in bed with guarding of LE R>L with attempted movement. See additional functional deficits below. He is functioning well below his baseline and will benefit from continued skilled therapy intervention with low level rehab <3hrs/day at discharge.         If plan is discharge home, recommend the following: Two people to help with walking and/or transfers;Two people to help with bathing/dressing/bathroom;Help with stairs or ramp for entrance;Assist for transportation   Can travel by private vehicle   No     Equipment Recommendations None recommended by PT  Recommendations for Other Services       Functional Status Assessment Patient has had a recent decline in their functional status and demonstrates the ability to make significant improvements in function in a reasonable and predictable amount of time.     Precautions / Restrictions Precautions Precautions: Fall Recall of Precautions/Restrictions: Impaired Restrictions Weight Bearing Restrictions Per Provider Order: Yes RLE Weight Bearing Per Provider Order: Non weight bearing LLE Weight Bearing Per Provider Order: Non weight bearing      Mobility  Bed Mobility Overal bed mobility: Needs Assistance Bed Mobility: Rolling           General bed mobility comments: intiated rolling with heavy assist of bedrails however unable to complete roll secondary to pain despite assistance; cues to use bedrails to pull up into long sitting, elevates shoulders off bed but then returns to supine secondary to pain    Transfers                   General transfer comment: deferred d/t pain, likely +2 and possible need for mechanical lift secondary to pain    Ambulation/Gait                  Stairs            Wheelchair Mobility     Tilt Bed    Modified Rankin (Stroke Patients Only)       Balance Overall balance assessment: Needs assistance     Sitting balance - Comments: unable to assess due to pain limiting pt's mobility, score based on clinical judgement and poor safety awareness when attempting low level mobility tasks in bed  Pertinent Vitals/Pain Pain Assessment Pain Assessment: Faces Faces Pain Scale: Hurts whole lot Pain Location: BLE R>L Pain Descriptors / Indicators: Grimacing, Moaning, Operative site guarding, Restless, Crying, Discomfort Pain Intervention(s): Limited activity within patient's tolerance, Repositioned, Utilized relaxation  techniques    Home Living Family/patient expects to be discharged to:: Private residence Living Arrangements: Parent Available Help at Discharge: Family;Available 24 hours/day Type of Home: Mobile home Home Access: Ramped entrance       Home Layout: One level Home Equipment: Shower seat;Cane - single point;Rolling Walker (2 wheels);Wheelchair - manual;Wheelchair - power;Grab bars - toilet;BSC/3in1 Additional Comments: pt lives with 41 y.o. mother who provides assistance and care for patient    Prior Function Prior Level of Function : Needs assist       Physical Assist : Mobility (physical);ADLs (physical) Mobility (physical): Transfers (primary WC user) ADLs (physical): Bathing;Dressing Mobility Comments: w/c at baseline, self propels vs being pushed ADLs Comments: mother provides max A - total A for bathing and dressing at bed level.  Pt transfers to [facility] with min A from mother and max A for toileitng     Extremity/Trunk Assessment   Upper Extremity Assessment Upper Extremity Assessment: Overall WFL for tasks assessed (unable to complete full ROM at shoulders, difficulty following commands for assessment, MMT grossly 3-/5)    Lower Extremity Assessment Lower Extremity Assessment: RLE deficits/detail;LLE deficits/detail RLE Deficits / Details: guarding limb and unable to move or allow PT to complete PROM secondary to pain, ace wrapped at distal limb which looks c/d/i, tender to touch below knee RLE Sensation: WNL LLE Deficits / Details: ROM WFL, MMT grossly 3/5, difficulty following commands for accurate MMT assessment LLE Sensation: WNL    Cervical / Trunk Assessment Cervical / Trunk Assessment: Normal  Communication   Communication Communication: No apparent difficulties (delayed response and processing time, unsure if this is on purpose or due to cognitive deficits vs medications/medical status)    Cognition Arousal: Alert Behavior During Therapy: Restless   PT -  Cognitive impairments: No family/caregiver present to determine baseline, Difficult to assess, Safety/Judgement, Problem solving, Initiation, Awareness                       PT - Cognition Comments: unable to assess cognition, pt easily distracted and perseverates on pain with difficulty redirecting,         Cueing Cueing Techniques: Verbal cues, Gestural cues, Tactile cues     General Comments      Exercises     Assessment/Plan    PT Assessment Patient needs continued PT services  PT Problem List Decreased strength;Decreased coordination;Decreased range of motion;Decreased activity tolerance;Decreased balance;Decreased mobility;Decreased safety awareness;Pain;Decreased cognition       PT Treatment Interventions Wheelchair mobility training;Therapeutic exercise;Therapeutic activities;Patient/family education;Functional mobility training;Neuromuscular re-education;Balance training    PT Goals (Current goals can be found in the Care Plan section)  Acute Rehab PT Goals Patient Stated Goal: go home PT Goal Formulation: With patient Time For Goal Achievement: 07/12/24 Potential to Achieve Goals: Fair    Frequency Min 2X/week     Co-evaluation               AM-PAC PT 6 Clicks Mobility  Outcome Measure Help needed turning from your back to your side while in a flat bed without using bedrails?: A Lot Help needed moving from lying on your back to sitting on the side of a flat bed without using bedrails?: Total Help needed moving to and from a  bed to a chair (including a wheelchair)?: Total Help needed standing up from a chair using your arms (e.g., wheelchair or bedside chair)?: Total Help needed to walk in hospital room?: Total Help needed climbing 3-5 steps with a railing? : Total 6 Click Score: 7    End of Session   Activity Tolerance: Patient limited by pain;Patient limited by fatigue Patient left: in bed;with call bell/phone within reach;with bed alarm  set Nurse Communication: Mobility status PT Visit Diagnosis: Other abnormalities of gait and mobility (R26.89);Muscle weakness (generalized) (M62.81);Pain Pain - Right/Left:  (BLE R>L) Pain - part of body: Knee;Leg;Ankle and joints of foot    Time: 9070-9052 PT Time Calculation (min) (ACUTE ONLY): 18 min   Charges:   PT Evaluation $PT Eval Moderate Complexity: 1 Mod             Cyriah Childrey H. Simranjit Thayer, PT, DPT   [employer] 06/28/2024, 10:36 AM

## 2024-06-28 NOTE — Progress Notes (Incomplete)
 Nutrition Follow-up  DOCUMENTATION CODES:   Not applicable  INTERVENTION:   Recommend regular diet to promote po intake  1 packet Juven BID, each packet provides 95 calories, 2.5 grams of protein (collagen),to support wound healing Ensure Plus High Protein po BID, each supplement provides 350 kcal and 20 grams of protein Prosource Plus BID, each supplement provides 100 kcal, 15 grams protein Renal MVI  daily 100 mg Thiamine  daily  Monitor magnesium, potassium, and phosphorus daily for at least 3 days, MD to replete as needed, as pt is at risk for refeeding syndrome RD will draw labs to rule out potential micronutrient deficiencies which may impede wound healing: zinc , vitamin C , vitamin A , Vitamin D , Vitamin E, and CRP    NUTRITION DIAGNOSIS:   Increased nutrient needs related to chronic illness as evidenced by estimated needs.  ***  GOAL:   Patient will meet greater than or equal to 90% of their needs  ***  MONITOR:   PO intake, Supplement acceptance, Diet advancement, Labs, Weight trends, I & O's, Skin  REASON FOR ASSESSMENT:   Consult Wound healing  ASSESSMENT:   64 yr old Male with PMH significant for poorly controlled type 2 diabetes mellitus, ESRD on HD MWF, severe PAD, HLD, GERD, hypothyroidism, and cardiomyopathy. Patient underwent left foot ray amputation on 03/25/24 and Right TMA on 04/26/24. Who presents with bilateral foot gangrene and nonhealing wounds.   1/13: s/p Revision left foot transmetatarsal to lisfranc amputation  1/14: s/p Right BKA  Had HD yesterday with UF 2.5L.    Noted he has experienced a 15.5% weight loss over the past 6 months, which is significant for time frame. Per nephrology notes, EDW is 63.8 kg down from 69 kg in October 2025, 7.5% weight loss in 3 months. Highly suspect malnutrition (has history of severe malnutrition) however, unable to identify at this time. He would greatly benefit from addition of oral nutrition supplements. NPO  for OR.   - Confused? - Livign before? Home vs SNF. - Doing ADL? - Nut hx: Meals/app/supps/ - Losing weight, EDW going down  - Liking supps here  Admit weight: *** Current weight: ***  Last Weight  Most recent update: 06/26/2024  4:53 PM    Weight  61.4 kg (135 lb 5.8 oz)             Average Meal Intake: ***-***: ***% intake x *** recorded meals  Nutritionally Relevant Medications: Scheduled Meds:  (feeding supplement) PROSource Plus  30 mL Oral BID AC   amitriptyline   10 mg Oral QHS   amLODipine   10 mg Oral q morning   atorvastatin   40 mg Oral Daily   calcium  acetate  667 mg Oral TID WC   Chlorhexidine  Gluconate Cloth  6 each Topical Q0600   Chlorhexidine  Gluconate Cloth  6 each Topical Daily   doxycycline   100 mg Oral Q12H   feeding supplement  237 mL Oral BID BM   heparin   5,000 Units Subcutaneous Q8H   levothyroxine   50 mcg Oral QAC breakfast   metoprolol  tartrate  50 mg Oral BID   multivitamin  1 tablet Oral QHS   mupirocin  ointment  1 Application Nasal BID   nutrition supplement (JUVEN)  1 packet Oral BID BM   pantoprazole   40 mg Oral Daily   thiamine   100 mg Oral Daily   Continuous Infusions:  sodium chloride  75 mL/hr at 06/25/24 0507   anticoagulant sodium citrate      PRN Meds:.acetaminophen  **OR** acetaminophen , alteplase , anticoagulant  sodium citrate , heparin , hydrALAZINE , HYDROmorphone  (DILAUDID ) injection, lidocaine  (PF), lidocaine -prilocaine , ondansetron  **OR** ondansetron  (ZOFRAN ) IV, oxyCODONE , phenol, polyethylene glycol, sodium chloride  flush  Labs Reviewed: *** CBG ranges from ***-*** mg/dL over the last 24 hours HgbA1c ***   NUTRITION - FOCUSED PHYSICAL EXAM:  {RD Focused Exam List:21252}  Diet Order:   Diet Order             Diet renal with fluid restriction Fluid consistency: Thin  Diet effective now                   EDUCATION NEEDS:   Not appropriate for education at this time  Skin:  Skin Assessment: Reviewed RN  Assessment (Closed surgical incision Bilateral feet)  Last BM:  PTA  Height:   Ht Readings from Last 1 Encounters:  06/26/24 5' 10 (1.778 m)    Weight:   Wt Readings from Last 1 Encounters:  06/26/24 61.4 kg    Ideal Body Weight:     BMI:  Body mass index is 19.42 kg/m.  Estimated Nutritional Needs:   Kcal:  2000-2200 kcal  Protein:  100-120 gm  Fluid:  100-120 gm    ***

## 2024-06-28 NOTE — NC FL2 (Signed)
 " [place]  MEDICAID FL2 LEVEL OF CARE FORM     IDENTIFICATION  Patient Name: [name] [name] Birthdate: 03-03-1961 Sex: male Admission Date (Current Location): 06/23/2024  [facility] and [place] Number:  Producer, Television/film/video and Address:  [facility], [address], [place], [place] [address]      Provider Number: [id number]  Attending Physician Name and Address:  Leotis Bogus, MD  Relative Name and Phone Number:  Rolf, F[place] Mother [phone]  [phone]    Current Level of Care: Hospital Recommended Level of Care: Skilled Nursing Facility Prior Approval Number:    Date Approved/Denied:   PASRR Number: [id number]  Discharge Plan: SNF    Current Diagnoses: Patient Active Problem List   Diagnosis Date Noted   Gangrene of foot (HCC) 06/23/2024   Gangrene of right foot (HCC) 04/26/2024   PAD (peripheral artery disease) 04/25/2024   Gangrene of left foot (HCC) 03/25/2024   Acute osteomyelitis of left ankle or foot (HCC) 03/25/2024   Wound dehiscence, surgical 03/20/2024   Postoperative wound infection 03/19/2024   ESRD on dialysis (HCC) 03/19/2024   PVD (peripheral vascular disease) 03/19/2024   Thrombocytopenia 03/19/2024   Controlled type 2 diabetes mellitus without complication, without long-term current use of insulin  (HCC) 03/19/2024   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   Liver lesion 09/05/2023   Chronic idiopathic constipation 09/05/2023   Hemorrhoids 09/05/2023   Dyspepsia 06/01/2023   Dysphagia 06/01/2023   Hyperkalemia 05/19/2023   Type 2 diabetes mellitus (HCC) 03/29/2023   Infection of AV graft for dialysis 03/27/2023   MRSA bacteremia 03/21/2023   Enterococcal bacteremia 03/21/2023   Hemodialysis catheter infection 03/21/2023   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   Delirium 03/21/2023   Protein-calorie malnutrition, severe 03/16/2023   Acute metabolic encephalopathy 03/06/2023   Rectal  bleeding 01/14/2023   Acute blood loss anemia 01/14/2023   Hypokalemia 01/12/2023   Proctitis 01/12/2023   Hypothyroidism 01/12/2023   GERD (gastroesophageal reflux disease) 01/12/2023   ESRD (end stage renal disease) (HCC) 01/12/2023   GI bleed 01/11/2023   Anemia in chronic kidney disease 10/04/2022   End stage renal disease (HCC) 10/04/2022   Anemia of chronic renal failure 06/09/2022   Pulmonary hypertension, unspecified (HCC) 07/20/2021   Stage 3 chronic kidney disease (HCC) 11/27/2020   Acute on chronic combined systolic and diastolic CHF (congestive heart failure) (HCC) 09/09/2020   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   Anemia of chronic disease 09/09/2020   Acute respiratory failure with hypoxia (HCC) 09/09/2020   Anasarca 08/28/2020   Nephrotic syndrome 08/28/2020   Dilated cardiomyopathy (HCC) 08/17/2020   Neuropathy 07/12/2018   Pyogenic inflammation of bone (HCC) 07/12/2018   Essential hypertension 02/14/2018   Uncontrolled type 2 diabetes mellitus with hyperglycemia (HCC) 01/08/2018   Hyperlipidemia associated with type 2 diabetes mellitus (HCC) 01/08/2018    Orientation RESPIRATION BLADDER Height & Weight     Self  Normal Incontinent Weight: 135 lb 5.8 oz (61.4 kg) Height:  5' 10 (177.8 cm)  BEHAVIORAL SYMPTOMS/MOOD NEUROLOGICAL BOWEL NUTRITION STATUS        Diet (see discharge summary)  AMBULATORY STATUS COMMUNICATION OF NEEDS Skin   Total Care Verbally Surgical wounds, Other (Comment) (redness)                       Personal Care Assistance Level of Assistance  Bathing, Feeding, Dressing Bathing Assistance: Limited assistance Feeding assistance: Limited assistance Dressing Assistance: Limited assistance  Functional Limitations Info  Sight, Hearing, Speech Sight Info: Adequate Hearing Info: Adequate Speech Info: Adequate    SPECIAL CARE FACTORS FREQUENCY  PT (By licensed PT), OT (By licensed OT)     PT Frequency: 5x  week OT Frequency: 5x week            Contractures Contractures Info: Not present    Additional Factors Info  Code Status, Allergies Code Status Info: full Allergies Info: NKA           Current Medications (06/28/2024):  This is the current hospital active medication list Current Facility-Administered Medications  Medication Dose Route Frequency Provider Last Rate Last Admin   (feeding supplement) PROSource Plus liquid 30 mL  30 mL Oral BID AC Eveland, Matthew, PA-C   30 mL at 06/28/24 0945   0.9 %  sodium chloride  infusion   Intravenous Continuous Eveland, Matthew, PA-C 75 mL/hr at 06/25/24 0507 Restarted at 06/25/24 9046   acetaminophen  (TYLENOL ) tablet 650 mg  650 mg Oral Q6H PRN Bethanie Cough, PA-C   650 mg at 06/25/24 9780   Or   acetaminophen  (TYLENOL ) suppository 650 mg  650 mg Rectal Q6H PRN Bethanie Cough, PA-C       alteplase  (CATHFLO ACTIVASE ) injection 2 mg  2 mg Intracatheter Once PRN Bethanie Cough, PA-C       amitriptyline  (ELAVIL ) tablet 10 mg  10 mg Oral QHS Bethanie Cough, PA-C   10 mg at 06/27/24 2255   amLODipine  (NORVASC ) tablet 10 mg  10 mg Oral q morning Bethanie Cough, PA-C   10 mg at 06/28/24 1026   anticoagulant sodium citrate  solution 5 mL  5 mL Intracatheter PRN Bethanie Cough, PA-C       atorvastatin  (LIPITOR) tablet 40 mg  40 mg Oral Daily Eveland, Matthew, PA-C   40 mg at 06/28/24 1027   calcium  acetate (PHOSLO ) capsule 667 mg  667 mg Oral TID WC Eveland, Matthew, PA-C   667 mg at 06/28/24 1132   Chlorhexidine  Gluconate Cloth 2 % PADS 6 each  6 each Topical Q0600 Bethanie Cough, PA-C   6 each at 06/28/24 1027   Chlorhexidine  Gluconate Cloth 2 % PADS 6 each  6 each Topical Daily Eveland, Matthew, PA-C   6 each at 06/28/24 1027   doxycycline  (VIBRA -TABS) tablet 100 mg  100 mg Oral Q12H Leotis Bogus, MD   100 mg at 06/28/24 1026   feeding supplement (ENSURE PLUS HIGH PROTEIN) liquid 237 mL  237 mL Oral BID BM Eveland, Matthew, PA-C    237 mL at 06/28/24 1027   heparin  injection 1,000 Units  1,000 Units Intracatheter PRN Bethanie Cough, PA-C       heparin  injection 5,000 Units  5,000 Units Subcutaneous Q8H Bethanie Cough, PA-C   5,000 Units at 06/26/24 2116   hydrALAZINE  (APRESOLINE ) injection 10 mg  10 mg Intravenous Q6H PRN Bethanie Cough, PA-C       HYDROmorphone  (DILAUDID ) injection 0.5-1 mg  0.5-1 mg Intravenous Q2H PRN Bethanie Cough, PA-C   1 mg at 06/28/24 1256   levothyroxine  (SYNTHROID ) tablet 50 mcg  50 mcg Oral QAC breakfast Bethanie Cough, PA-C   50 mcg at 06/28/24 1027   lidocaine  (PF) (XYLOCAINE ) 1 % injection 5 mL  5 mL Intradermal PRN Bethanie Cough, PA-C       lidocaine -prilocaine  (EMLA ) cream 1 Application  1 Application Topical PRN Eveland, Matthew, PA-C       metoprolol  tartrate (LOPRESSOR ) tablet 50 mg  50 mg Oral BID Eveland,  Donnice, PA-C   50 mg at 06/28/24 1027   multivitamin (RENA-VIT) tablet 1 tablet  1 tablet Oral QHS Bethanie Donnice, PA-C   1 tablet at 06/27/24 2255   mupirocin  ointment (BACTROBAN ) 2 % 1 Application  1 Application Nasal BID Bethanie Donnice, PA-C   1 Application at 06/28/24 1026   nutrition supplement (JUVEN) (JUVEN) powder packet 1 packet  1 packet Oral BID BM Eveland, Matthew, PA-C   1 packet at 06/28/24 1028   ondansetron  (ZOFRAN ) tablet 4 mg  4 mg Oral Q6H PRN Bethanie Donnice, PA-C       Or   ondansetron  (ZOFRAN ) injection 4 mg  4 mg Intravenous Q6H PRN Bethanie Donnice, PA-C       oxyCODONE  (Oxy IR/ROXICODONE ) immediate release tablet 5-10 mg  5-10 mg Oral Q4H PRN Bethanie Donnice, PA-C   10 mg at 06/28/24 0207   pantoprazole  (PROTONIX ) EC tablet 40 mg  40 mg Oral Daily Eveland, Matthew, PA-C   40 mg at 06/28/24 1027   pentafluoroprop-tetrafluoroeth (GEBAUERS) aerosol 1 Application  1 Application Topical PRN Geralynn Charleston, MD       phenol (CHLORASEPTIC) mouth spray 1 spray  1 spray Mouth/Throat PRN Leotis Bogus, MD       polyethylene glycol (MIRALAX  /  GLYCOLAX ) packet 17 g  17 g Oral Daily PRN Bethanie Donnice, PA-C   17 g at 06/27/24 1110   sodium chloride  flush (NS) 0.9 % injection 10-40 mL  10-40 mL Intracatheter PRN Bethanie Donnice, PA-C   10 mL at 06/27/24 2222   thiamine  (VITAMIN B1) tablet 100 mg  100 mg Oral Daily Bethanie Donnice, PA-C   100 mg at 06/28/24 1026     Discharge Medications: Please see discharge summary for a list of discharge medications.  Relevant Imaging Results:  Relevant Lab Results:   Additional Information SSN [id number]  HD @ [facility] MWF 1210 chair time  Bridget Cordella Simmonds, LCSW     "

## 2024-06-28 NOTE — Progress Notes (Signed)
 Pt. Refused HD tx today. Informed Dr. Ephriam Stank about it and pt.will be dialyzed tomorrow.

## 2024-06-28 NOTE — TOC Initial Note (Signed)
 Transition of Care [facility]) - Initial/Assessment Note    Patient Details  Name: [name] [name] MRN: [id number] Date of Birth: 1961/01/10  Transition of Care [facility]) CM/SW Contact:    [name] Cordella Simmonds, LCSW Phone Number: 06/28/2024, 3:40 PM  Clinical Narrative:  Pt oriented x1, [name] also in room, all information from her.  Pt mostly sleeping, does open eyes a few times, does speak briefly to CSW.  Pt from home, used to live next door to mother but has been in her home for the past year, no current services.  [name] [name] reports he was at [facility] in the past and did not like it but she is in agreement that he does need SNF, permission given to send out referral in the hub.  Referral sent out in hub. HD pt: [facility] MWF 1210 chair time                 Expected Discharge Plan: Skilled Nursing Facility Barriers to Discharge: Continued Medical Work up, SNF Pending bed offer   Patient Goals and CMS Choice     Choice offered to / list presented to :  ([name])      Expected Discharge Plan and Services In-house Referral: Clinical Social Work   Post Acute Care Choice: Skilled Nursing Facility Living arrangements for the past 2 months: Single Family Home                                      Prior Living Arrangements/Services Living arrangements for the past 2 months: Single Family Home Lives with:: Parents (lives with mother) Patient language and need for interpreter reviewed:: Yes        Need for Family Participation in Patient Care: Yes (Comment) Care giver support system in place?: Yes (comment) Current home services: Other (comment) (none) Criminal Activity/Legal Involvement Pertinent to Current Situation/Hospitalization: No - Comment as needed  Activities of Daily Living   ADL Screening (condition at time of admission) Independently performs ADLs?: No Does the patient have a NEW difficulty with bathing/dressing/toileting/self-feeding that is expected  to last >3 days?: Yes (Initiates electronic notice to provider for possible OT consult) Does the patient have a NEW difficulty with getting in/out of bed, walking, or climbing stairs that is expected to last >3 days?: Yes (Initiates electronic notice to provider for possible PT consult) Does the patient have a NEW difficulty with communication that is expected to last >3 days?: No Is the patient deaf or have difficulty hearing?: No Does the patient have difficulty seeing, even when wearing glasses/contacts?: No Does the patient have difficulty concentrating, remembering, or making decisions?: No  Permission Sought/Granted                  Emotional Assessment Appearance:: Appears stated age Attitude/Demeanor/Rapport: Engaged Affect (typically observed): Appropriate, Pleasant Orientation: : Oriented to Self      Admission diagnosis:  Gangrene (HCC) [I96] Gangrene of foot (HCC) [I96] Patient Active Problem List   Diagnosis Date Noted   Gangrene of foot (HCC) 06/23/2024   Gangrene of right foot (HCC) 04/26/2024   PAD (peripheral artery disease) 04/25/2024   Gangrene of left foot (HCC) 03/25/2024   Acute osteomyelitis of left ankle or foot (HCC) 03/25/2024   Wound dehiscence, surgical 03/20/2024   Postoperative wound infection 03/19/2024   ESRD on dialysis (HCC) 03/19/2024   PVD (peripheral vascular disease) 03/19/2024   Thrombocytopenia  03/19/2024   Controlled type 2 diabetes mellitus without complication, without long-term current use of insulin  (HCC) 03/19/2024   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   Liver lesion 09/05/2023   Chronic idiopathic constipation 09/05/2023   Hemorrhoids 09/05/2023   Dyspepsia 06/01/2023   Dysphagia 06/01/2023   Hyperkalemia 05/19/2023   Type 2 diabetes mellitus (HCC) 03/29/2023   Infection of AV graft for dialysis 03/27/2023   MRSA bacteremia 03/21/2023   Enterococcal bacteremia 03/21/2023   Hemodialysis catheter  infection 03/21/2023   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   Delirium 03/21/2023   Protein-calorie malnutrition, severe 03/16/2023   Acute metabolic encephalopathy 03/06/2023   Rectal bleeding 01/14/2023   Acute blood loss anemia 01/14/2023   Hypokalemia 01/12/2023   Proctitis 01/12/2023   Hypothyroidism 01/12/2023   GERD (gastroesophageal reflux disease) 01/12/2023   ESRD (end stage renal disease) (HCC) 01/12/2023   GI bleed 01/11/2023   Anemia in chronic kidney disease 10/04/2022   End stage renal disease (HCC) 10/04/2022   Anemia of chronic renal failure 06/09/2022   Pulmonary hypertension, unspecified (HCC) 07/20/2021   Stage 3 chronic kidney disease (HCC) 11/27/2020   Acute on chronic combined systolic and diastolic CHF (congestive heart failure) (HCC) 09/09/2020   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 [place] [address] Phone: [phone] Fax: [phone]  [facility] [place], [place] - [address], [place] [address] [place] [address] Phone: [phone] Fax: [phone]  [name] [address] [place] [address] Phone: [phone] Fax: [phone]     Social Drivers of Health (SDOH) Social History: SDOH Screenings   Food Insecurity: No Food Insecurity (06/24/2024)  Housing:  Low Risk (06/24/2024)  Transportation Needs: No Transportation Needs (06/24/2024)  Utilities: Not At Risk (06/24/2024)  Social Connections: Socially Isolated (06/24/2024)  Tobacco Use: Low Risk (06/26/2024)   SDOH Interventions: Food Insecurity Interventions: Intervention Not Indicated, Inpatient TOC   Readmission Risk Interventions    03/28/2024    1:14 PM 03/29/2023    1:45 PM 03/07/2023    1:31 PM  Readmission Risk Prevention Plan  Transportation Screening Complete Complete Complete  HRI or Home Care Consult   Complete  Palliative Care Screening   Not Applicable  Medication Review (RN Care Manager) Complete Complete Complete  PCP or Specialist appointment within 3-5 days of discharge  Complete   HRI or Home Care Consult Complete Complete   SW Recovery Care/Counseling Consult Patient refused Complete   Palliative Care Screening Complete Complete   Skilled Nursing Facility Patient Refused Complete

## 2024-06-28 NOTE — Progress Notes (Signed)
 " [facility] Progress Note   Subjective:    Seen and examined patient at bedside. No acute events. Remains confused in regards to which day it is. Redirected and he finally understood today is Friday/day of dialysis.  Objective Vitals:   06/27/24 1500 06/27/24 2230 06/28/24 0300 06/28/24 0740  BP:  (!) 164/76 (!) 158/76 (!) 166/86  Pulse:  70 69 82  Resp:  14 16 16   Temp:  98.5 F (36.9 C) 98.5 F (36.9 C) (!) 97.4 F (36.3 C)  TempSrc: Other (Comment) Oral Axillary Oral  SpO2:  100% 99% 98%  Weight:      Height:       Physical Exam General: NAD Head: NCAT sclera not icteric  Lungs: unlabored, normal wob Heart: RRR Abdomen: soft and non-tender  Lower extremities: No LE edema; rt bka Neuro: awake, alert Dialysis Access: R [facility]  Filed Weights   06/26/24 0925 06/26/24 1250 06/26/24 1647  Weight: 66 kg 63.6 kg 61.4 kg    Intake/Output Summary (Last 24 hours) at 06/28/2024 0941 Last data filed at 06/28/2024 0500 Gross per 24 hour  Intake 940 ml  Output --  Net 940 ml    Additional Objective Labs: Basic Metabolic Panel: Recent Labs  Lab 06/23/24 1340 06/24/24 0545 06/25/24 0904 06/26/24 0848 06/26/24 0944  NA 138 138 133* 137 134*  K 3.8 4.4 5.1 4.5 4.4  CL 99 100 99 97* 102  CO2 25 23  --  22  --   GLUCOSE 236* 125* 95 135* 114*  BUN 25* 33* 29* 39* 41*  CREATININE 5.51* 6.36* 4.50* 5.38* 5.70*  CALCIUM  8.6* 8.5*  --  8.9  --   PHOS  --  3.4  --  5.2*  --    Liver Function Tests: Recent Labs  Lab 06/24/24 0545  AST 18  ALT 9  ALKPHOS 131*  BILITOT 0.3  PROT 6.3*  ALBUMIN  2.9*   No results for input(s): LIPASE, AMYLASE in the last 168 hours. CBC: Recent Labs  Lab 06/23/24 1340 06/24/24 0545 06/25/24 0904 06/26/24 0848 06/26/24 0944  WBC 10.9* 10.3  --  12.7*  --   NEUTROABS 8.7* 8.3*  --   --   --   HGB 10.6* 9.5* 12.2* 10.4* 11.9*  HCT 36.2* 31.9* 36.0* 34.1* 35.0*  MCV 98.6 96.7  --  93.2  --   PLT 247 240  --  315   --    Blood Culture    Component Value Date/Time   SDES TISSUE 06/25/2024 1020   SPECREQUEST LEFT FOOT TISSUE 06/25/2024 1020   CULT  06/25/2024 1020    ABUNDANT STAPHYLOCOCCUS AUREUS SUSCEPTIBILITIES PERFORMED ON PREVIOUS CULTURE WITHIN THE LAST 5 DAYS. NO ANAEROBES ISOLATED; CULTURE IN PROGRESS FOR 5 DAYS    REPTSTATUS PENDING 06/25/2024 1020    Cardiac Enzymes: No results for input(s): CKTOTAL, CKMB, CKMBINDEX, TROPONINI in the last 168 hours. CBG: Recent Labs  Lab 06/25/24 0843 06/25/24 1042 06/26/24 0900 06/26/24 0927 06/26/24 1211  GLUCAP 87 94 114* 107* 133*   Iron Studies: No results for input(s): IRON, TIBC, TRANSFERRIN, FERRITIN in the last 72 hours. Lab Results  Component Value Date   INR 1.2 05/13/2024   INR 1.1 05/19/2023   INR 1.1 03/06/2023   Studies/Results: No results found.   Medications:  sodium chloride  75 mL/hr at 06/25/24 0507   anticoagulant sodium citrate       (feeding supplement) PROSource Plus  30 mL Oral BID AC  amitriptyline   10 mg Oral QHS   amLODipine   10 mg Oral q morning   atorvastatin   40 mg Oral Daily   calcium  acetate  667 mg Oral TID WC   Chlorhexidine  Gluconate Cloth  6 each Topical Q0600   Chlorhexidine  Gluconate Cloth  6 each Topical Daily   doxycycline   100 mg Oral Q12H   feeding supplement  237 mL Oral BID BM   heparin   5,000 Units Subcutaneous Q8H   levothyroxine   50 mcg Oral QAC breakfast   metoprolol  tartrate  50 mg Oral BID   multivitamin  1 tablet Oral QHS   mupirocin  ointment  1 Application Nasal BID   nutrition supplement (JUVEN)  1 packet Oral BID BM   pantoprazole   40 mg Oral Daily   thiamine   100 mg Oral Daily    Dialysis Orders: MWF - [facility] 4hrs, BFR 400, DFR Auto 1.5,  EDW 63.8kg, 3K/ 2.5Ca Mircera 225 mcg q2wks - last 06/21/24  Assessment/Plan: Gangrene left/right foot, s/p b/l TMA - Podiatry and VVS following. S/p left-sided TMA revision 1/13, s/p rt bka 1/14. VVS  following ESRD - on HD MWF, today Hypertension/volume  - UF as tolerated Anemia of CKD - ESA given 1/9 in outpatient, not due yet, hgb 11.9-on target Secondary Hyperparathyroidism -  po4 acceptable Nutrition - Renal diet with fluid restriction   Ephriam Stank, MD [facility] 06/28/2024,9:41 AM  LOS: 5 days    "

## 2024-06-28 NOTE — Progress Notes (Addendum)
" °  Progress Note    06/28/2024 6:44 AM 2 Days Post-Op  Subjective:  no complaints  Afebrile  Vitals:   06/27/24 2230 06/28/24 0300  BP: (!) 164/76 (!) 158/76  Pulse: 70 69  Resp: 14 16  Temp: 98.5 F (36.9 C) 98.5 F (36.9 C)  SpO2: 100% 99%    Physical Exam: Incisions:      CBC    Component Value Date/Time   WBC 12.7 (H) 06/26/2024 0848   RBC 3.66 (L) 06/26/2024 0848   HGB 11.9 (L) 06/26/2024 0944   HCT 35.0 (L) 06/26/2024 0944   PLT 315 06/26/2024 0848   MCV 93.2 06/26/2024 0848   MCH 28.4 06/26/2024 0848   MCHC 30.5 06/26/2024 0848   RDW 15.4 06/26/2024 0848   LYMPHSABS 1.0 06/24/2024 0545   MONOABS 0.4 06/24/2024 0545   EOSABS 0.4 06/24/2024 0545   BASOSABS 0.1 06/24/2024 0545    BMET    Component Value Date/Time   NA 134 (L) 06/26/2024 0944   K 4.4 06/26/2024 0944   CL 102 06/26/2024 0944   CO2 22 06/26/2024 0848   GLUCOSE 114 (H) 06/26/2024 0944   BUN 41 (H) 06/26/2024 0944   CREATININE 5.70 (H) 06/26/2024 0944   CALCIUM  8.9 06/26/2024 0848   GFRNONAA 11 (L) 06/26/2024 0848   GFRAA 48 (L) 01/31/2020 1216    INR    Component Value Date/Time   INR 1.2 05/13/2024 1600     Intake/Output Summary (Last 24 hours) at 06/28/2024 0644 Last data filed at 06/28/2024 0500 Gross per 24 hour  Intake 940 ml  Output --  Net 940 ml     Assessment/Plan:  64 y.o. male is s/p right below knee amputation   2 Days Post-Op   -dressing removed and incision looks good.  Small blister medially but overall, looks good.   -daily dressing changes -f/u in our office in 4-5 weeks for staple removal.     Lucie Apt, PA-C [facility] [phone] 06/28/2024 6:44 AM    I have seen and evaluated the patient. I agree with the PA note as documented above. BKA looks good.  Call vascular with questions or concerns.  Lonni DOROTHA Gaskins, MD [facility] [place] Office: [phone]        "

## 2024-06-28 NOTE — Plan of Care (Signed)
" °  Problem: Nutritional: Goal: Maintenance of adequate nutrition will improve Outcome: Progressing   Problem: Tissue Perfusion: Goal: Adequacy of tissue perfusion will improve Outcome: Progressing   Problem: Clinical Measurements: Goal: Cardiovascular complication will be avoided Outcome: Progressing   "

## 2024-06-29 DIAGNOSIS — I96 Gangrene, not elsewhere classified: Secondary | ICD-10-CM | POA: Diagnosis not present

## 2024-06-29 LAB — GLUCOSE, CAPILLARY: Glucose-Capillary: 91 mg/dL (ref 70–99)

## 2024-06-29 MED ORDER — HYDROMORPHONE HCL 1 MG/ML IJ SOLN
INTRAMUSCULAR | Status: AC
  Filled 2024-06-29: qty 0.5

## 2024-06-29 MED ORDER — HEPARIN SODIUM (PORCINE) 1000 UNIT/ML IJ SOLN
INTRAMUSCULAR | Status: AC
  Filled 2024-06-29: qty 4

## 2024-06-29 NOTE — Progress Notes (Signed)
 " PROGRESS NOTE    [name] [name]  FMW:[id number] DOB: [birth year] DOA: 06/23/2024  PCP: Kip Righter, MD   Brief Narrative:  [name] [name] is a 64 y o man with poorly controlled type 2 diabetes mellitus, ESRD on hemodialysis (M/W/F schedule), severe peripheral arterial disease, hyperlipidemia, hypothyroidism, and cardiomyopathy who presents from podiatry clinic with bilateral foot gangrene and nonhealing wounds.  Patient underwent left foot ray amputation on 03/25/24 and Right TMA on 04/26/24.  Despite these intervention and multiple prior revascularization procedures he presented with dry gangrene/necrotic eschar at the right TMA site and nonhealing wounds at the left TMA site. His vascular history is extensive, including multiple lower extremity angiograms with interventions (01/30/2024, 03/20/2024, 04/16/2024) including peripheral atherectomy, intravascular lithotripsy, and angioplasty. He has bilateral AV grafts for dialysis access (right arm 08/17/2023, left arm 12/27/2022) and currently has a right internal jugular dialysis catheter.   Patient was admitted for further evaluation.  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   Assessment & Plan:   Principal Problem:   Gangrene of foot (HCC)  Dry gangrene / Necrotic eschar Right TMA site: Left foot nonhealing post TMA : Bilateral Osteomyelitis: Severe PAD / Limb ischemia: Continue empiric antibiotics (Vancomycin , cefepime , Flagyl ) MRI Right Foot > highly suspicious for bone infarcts and osteomyelitis.  Diffuse cellulitis and myofasciitis. MRI left foot > Open wounds along mid metatarsal surgical site, soft tissue inflammation consistent with tissue necrosis,  nonviable tissue.  Osteomyelitis. Patient underwent revision of left foot transmetatarsal to lisfranc amputation on 1/13 Patient underwent right BKA by vascular surgery on  1/14 Nonweightbearing bilateral lower extremity for now. Dressing removed and incision looks good.  Daily dressing changes recommended.  Follow-up outpatient in 4 to 6 weeks for staple removal  End-stage renal disease on hemodialysis: Nephrology is consulted for dialysis coordination. Monitor volume status, monitor electrolytes. Continue dialysis as per nephrology.  Last HD Saturday 1/17  Type 2 diabetes: Hold p.o. diabetic medications Continue regular insulin  sliding scale.  Chronic combined systolic and diastolic CHF: Appears euvolemic on exam. Chest x-ray showed cardiomegaly. Continue volume management with hemodialysis.  Hypothyroidism: Continue levothyroxine .  Essential hypertension: Continue amlodipine . Continue metoprolol .  GERD: Continue pantoprazole .   DVT prophylaxis: Heparin  sq Code Status: Full code Family Communication: No family at bed side Disposition Plan:   Status is: Inpatient Remains inpatient appropriate because: Patient admitted for critical limb ischemia leading to necrotic TMA site.  Started on empiric antibiotics , vascular and podiatry is planning intervention this admission. Patient is not medically ready for discharge yet.    Consultants:  Podiatry Vascular surgery Nephrology  Procedures:  Antimicrobials:  Anti-infectives (From admission, onward)    Start     Dose/Rate Route Frequency Ordered Stop   06/28/24 1200  vancomycin  (VANCOREADY) IVPB 750 mg/150 mL  Status:  Discontinued        750 mg 150 mL/hr over 60 Minutes Intravenous Every M-W-F (Hemodialysis) 06/26/24 1356 06/26/24 1410   06/28/24 1000  doxycycline  (VIBRA -TABS) tablet 100 mg        100 mg Oral Every 12 hours 06/28/24 0852 07/03/24 0959   06/26/24 1430  vancomycin  (VANCOREADY) IVPB 750 mg/150 mL  Status:  Discontinued        750 mg 150 mL/hr over 60 Minutes Intravenous Every M-W-F (Hemodialysis) 06/26/24 1410 06/28/24 0852   06/24/24 1200  vancomycin  (VANCOREADY) IVPB  750 mg/150 mL  Status:  Discontinued  750 mg 150 mL/hr over 60 Minutes Intravenous Every M-W-F (Hemodialysis) 06/23/24 2022 06/26/24 1356   06/23/24 2030  metroNIDAZOLE  (FLAGYL ) IVPB 500 mg  Status:  Discontinued        500 mg 100 mL/hr over 60 Minutes Intravenous Every 12 hours 06/23/24 2018 06/28/24 0852   06/23/24 2030  ceFEPIme  (MAXIPIME ) 1 g in sodium chloride  0.9 % 100 mL IVPB  Status:  Discontinued        1 g 200 mL/hr over 30 Minutes Intravenous Every 24 hours 06/23/24 2021 06/28/24 0852   06/23/24 2030  vancomycin  (VANCOREADY) IVPB 1500 mg/300 mL        1,500 mg 150 mL/hr over 120 Minutes Intravenous  Once 06/23/24 2022 06/24/24 0152      Subjective: Patient was seen and examined at bed side. Overnight events noted.  He underwent revision left foot transmetatarsal to lisfranc amputation 06/25/24. He underwent right BKA  06/26/24. POD # 2.  Dressing removed.  Wound looks good.   Patient is going to have dialysis today  Objective: Vitals:   06/29/24 1223 06/29/24 1245 06/29/24 1333 06/29/24 1422  BP: 114/79  (!) 140/75 (!) 158/79  Pulse: 81  75 75  Resp: 10   16  Temp: 98 F (36.7 C)   98.1 F (36.7 C)  TempSrc:      SpO2: 100%   98%  Weight: 59.8 kg 57.3 kg    Height:        Intake/Output Summary (Last 24 hours) at 06/29/2024 1616 Last data filed at 06/29/2024 1223 Gross per 24 hour  Intake --  Output 2500 ml  Net -2500 ml   Filed Weights   06/29/24 0821 06/29/24 1223 06/29/24 1245  Weight: 61.2 kg 59.8 kg 57.3 kg    General: Alert, awake, responsive  Eyes: Pupils equal, reactive  Oral cavity: moist mucous membranes  Head: Atraumatic, normocephalic  Neck: supple  Chest: clear to auscultation. No crackles, no wheezes  CVS: S1,S2 RRR. No murmurs  Abd: No distention, soft, non-tender. No masses palpable  Extr: No edema   MSK: L TMA, R AKA Neurological: Grossly intact.   Data Reviewed: I have personally reviewed following labs and imaging  studies  CBC: Recent Labs  Lab 06/23/24 1340 06/24/24 0545 06/25/24 0904 06/26/24 0848 06/26/24 0944  WBC 10.9* 10.3  --  12.7*  --   NEUTROABS 8.7* 8.3*  --   --   --   HGB 10.6* 9.5* 12.2* 10.4* 11.9*  HCT 36.2* 31.9* 36.0* 34.1* 35.0*  MCV 98.6 96.7  --  93.2  --   PLT 247 240  --  315  --    Basic Metabolic Panel: Recent Labs  Lab 06/23/24 1340 06/24/24 0545 06/25/24 0904 06/26/24 0848 06/26/24 0944 06/28/24 1421  NA 138 138 133* 137 134* 132*  K 3.8 4.4 5.1 4.5 4.4 4.1  CL 99 100 99 97* 102 95*  CO2 25 23  --  22  --  23  GLUCOSE 236* 125* 95 135* 114* 136*  BUN 25* 33* 29* 39* 41* 39*  CREATININE 5.51* 6.36* 4.50* 5.38* 5.70* 5.08*  CALCIUM  8.6* 8.5*  --  8.9  --  9.1  MG  --  2.0  --   --   --   --   PHOS  --  3.4  --  5.2*  --  3.3   GFR: Estimated Creatinine Clearance: 11.7 mL/min (A) (by C-G formula based on SCr of 5.08 mg/dL (H)). Liver  Function Tests: Recent Labs  Lab 06/24/24 0545 06/28/24 1421  AST 18  --   ALT 9  --   ALKPHOS 131*  --   BILITOT 0.3  --   PROT 6.3*  --   ALBUMIN  2.9* 2.9*   No results for input(s): LIPASE, AMYLASE in the last 168 hours. No results for input(s): AMMONIA in the last 168 hours. Coagulation Profile: No results for input(s): INR, PROTIME in the last 168 hours. Cardiac Enzymes: No results for input(s): CKTOTAL, CKMB, CKMBINDEX, TROPONINI in the last 168 hours. BNP (last 3 results) No results for input(s): PROBNP in the last 8760 hours. HbA1C: No results for input(s): HGBA1C in the last 72 hours. CBG: Recent Labs  Lab 06/25/24 0843 06/25/24 1042 06/26/24 0900 06/26/24 0927 06/26/24 1211  GLUCAP 87 94 114* 107* 133*   Lipid Profile: No results for input(s): CHOL, HDL, LDLCALC, TRIG, CHOLHDL, LDLDIRECT in the last 72 hours. Thyroid  Function Tests: No results for input(s): TSH, T4TOTAL, FREET4, T3FREE, THYROIDAB in the last 72 hours. Anemia Panel: No results  for input(s): VITAMINB12, FOLATE, FERRITIN, TIBC, IRON, RETICCTPCT in the last 72 hours. Sepsis Labs: No results for input(s): PROCALCITON, LATICACIDVEN in the last 168 hours.  Recent Results (from the past 240 hours)  Blood culture (routine x 2)     Status: None   Collection Time: 06/23/24  4:48 PM   Specimen: BLOOD  Result Value Ref Range Status   Specimen Description BLOOD SITE NOT SPECIFIED  Final   Special Requests   Final    BOTTLES DRAWN AEROBIC AND ANAEROBIC Blood Culture results may not be optimal due to an inadequate volume of blood received in culture bottles   Culture   Final    NO GROWTH 5 DAYS Performed at [facility], [address], [place], [place] [address]    Report Status 06/28/2024 FINAL  Final  Blood culture (routine x 2)     Status: None   Collection Time: 06/23/24  4:53 PM   Specimen: BLOOD  Result Value Ref Range Status   Specimen Description BLOOD SITE NOT SPECIFIED  Final   Special Requests   Final    BOTTLES DRAWN AEROBIC AND ANAEROBIC Blood Culture results may not be optimal due to an inadequate volume of blood received in culture bottles   Culture   Final    NO GROWTH 5 DAYS Performed at [facility], [address], [place], [place] [address]    Report Status 06/28/2024 FINAL  Final  Surgical pcr screen     Status: Abnormal   Collection Time: 06/25/24  2:26 AM   Specimen: Nasal Mucosa; Nasal Swab  Result Value Ref Range Status   MRSA, PCR POSITIVE (A) NEGATIVE Final   Staphylococcus aureus POSITIVE (A) NEGATIVE Final    Comment: RESULT CALLED TO, READ BACK BY AND VERIFIED WITH: A LYNPHACUM RN 06/25/2024 @ 0414 BY AB (NOTE) The Xpert SA Assay (FDA approved for NASAL specimens in patients 8 years of age and older), is one component of a comprehensive surveillance program. It is not intended to diagnose infection nor to guide or monitor treatment. Performed at [facility], [address] [place],  [place] [address]   Aerobic/Anaerobic Culture w Gram Stain (surgical/deep wound)     Status: None (Preliminary result)   Collection Time: 06/25/24 10:19 AM   Specimen: PATH Soft tissue  Result Value Ref Range Status   Specimen Description TISSUE  Final   Special  Requests LEFT FOOT ABSCESS  Final   Gram Stain   Final    NO WBC SEEN RARE GRAM POSITIVE COCCI Performed at [facility], [address], [place], [place] [address]    Culture   Final    MODERATE METHICILLIN RESISTANT STAPHYLOCOCCUS AUREUS NO ANAEROBES ISOLATED; CULTURE IN PROGRESS FOR 5 DAYS    Report Status PENDING  Incomplete   Organism ID, Bacteria METHICILLIN RESISTANT STAPHYLOCOCCUS AUREUS  Final      Susceptibility   Methicillin resistant staphylococcus aureus - MIC*    CIPROFLOXACIN >=8 RESISTANT Resistant     ERYTHROMYCIN >=8 RESISTANT Resistant     GENTAMICIN <=0.5 SENSITIVE Sensitive     OXACILLIN >=4 RESISTANT Resistant     TETRACYCLINE <=1 SENSITIVE Sensitive     VANCOMYCIN  1 SENSITIVE Sensitive     TRIMETH/SULFA >=320 RESISTANT Resistant     CLINDAMYCIN <=0.25 SENSITIVE Sensitive     RIFAMPIN <=0.5 SENSITIVE Sensitive     Inducible Clindamycin NEGATIVE Sensitive     LINEZOLID 2 SENSITIVE Sensitive     * MODERATE METHICILLIN RESISTANT STAPHYLOCOCCUS AUREUS  Aerobic/Anaerobic Culture w Gram Stain (surgical/deep wound)     Status: None (Preliminary result)   Collection Time: 06/25/24 10:20 AM   Specimen: PATH Soft tissue  Result Value Ref Range Status   Specimen Description TISSUE  Final   Special Requests LEFT FOOT TISSUE  Final   Gram Stain NO WBC SEEN ABUNDANT GRAM POSITIVE COCCI   Final   Culture   Final    ABUNDANT STAPHYLOCOCCUS AUREUS RARE PSEUDOMONAS FLUORESCENS CRITICAL RESULT CALLED TO, READ BACK BY AND VERIFIED WITH: RN ANGEL M [id number] FCP SUSCEPTIBILITIES PERFORMED ON PREVIOUS CULTURE WITHIN THE LAST 5 DAYS. STAPHYLOCOCCUS AUREUS SUSCEPTIBILITIES TO FOLLOW Performed at [facility], [address] [place], [place] [address]    Report Status PENDING  Incomplete    Radiology Studies: No results found.  Scheduled Meds:  (feeding supplement) PROSource Plus  30 mL Oral BID AC   amitriptyline   10 mg Oral QHS   amLODipine   10 mg Oral q morning   atorvastatin   40 mg Oral Daily   calcium  acetate  667 mg Oral TID WC   Chlorhexidine  Gluconate Cloth  6 each Topical Q0600   Chlorhexidine  Gluconate Cloth  6 each Topical Daily   doxycycline   100 mg Oral Q12H   feeding supplement  237 mL Oral BID BM   heparin   5,000 Units Subcutaneous Q8H   levothyroxine   50 mcg Oral QAC breakfast   metoprolol  tartrate  50 mg Oral BID   multivitamin  1 tablet Oral QHS   mupirocin  ointment  1 Application Nasal BID   nutrition supplement (JUVEN)  1 packet Oral BID BM   pantoprazole   40 mg Oral Daily   thiamine   100 mg Oral Daily   Continuous Infusions:  sodium chloride  75 mL/hr at 06/25/24 0507   anticoagulant sodium citrate        LOS: 6 days    Time spent: 35 mins    MDALA-GAUSI, GOLDEN PILLOW, MD [facility]   If 7PM-7AM, please contact night-coverage  "

## 2024-06-29 NOTE — Progress Notes (Signed)
 " [facility] Progress Note   Subjective:    Seen and examined patient at bedside. No acute events. Refused HD yesterday hence today. Resting comfortably  Objective Vitals:   06/29/24 0324 06/29/24 0738 06/29/24 0821 06/29/24 0833  BP: 139/66 (!) 168/92 (!) 160/72 (!) 148/67  Pulse: 64 75  68  Resp: 18 16  17   Temp: 98.7 F (37.1 C) 97.6 F (36.4 C) 98.8 F (37.1 C)   TempSrc:  Axillary Oral   SpO2: 99% 100%  96%  Weight:   61.2 kg   Height:       Physical Exam General: NAD Head: NCAT sclera not icteric  Lungs: unlabored, normal wob Heart: RRR Abdomen: soft and non-tender  Lower extremities: No LE edema; rt bka Neuro: awake, alert Dialysis Access: R [facility]  Filed Weights   06/26/24 1250 06/26/24 1647 06/29/24 0821  Weight: 63.6 kg 61.4 kg 61.2 kg    Intake/Output Summary (Last 24 hours) at 06/29/2024 0925 Last data filed at 06/28/2024 1500 Gross per 24 hour  Intake 0 ml  Output --  Net 0 ml    Additional Objective Labs: Basic Metabolic Panel: Recent Labs  Lab 06/24/24 0545 06/25/24 0904 06/26/24 0848 06/26/24 0944 06/28/24 1421  NA 138   < > 137 134* 132*  K 4.4   < > 4.5 4.4 4.1  CL 100   < > 97* 102 95*  CO2 23  --  22  --  23  GLUCOSE 125*   < > 135* 114* 136*  BUN 33*   < > 39* 41* 39*  CREATININE 6.36*   < > 5.38* 5.70* 5.08*  CALCIUM  8.5*  --  8.9  --  9.1  PHOS 3.4  --  5.2*  --  3.3   < > = values in this interval not displayed.   Liver Function Tests: Recent Labs  Lab 06/24/24 0545 06/28/24 1421  AST 18  --   ALT 9  --   ALKPHOS 131*  --   BILITOT 0.3  --   PROT 6.3*  --   ALBUMIN  2.9* 2.9*   No results for input(s): LIPASE, AMYLASE in the last 168 hours. CBC: Recent Labs  Lab 06/23/24 1340 06/24/24 0545 06/25/24 0904 06/26/24 0848 06/26/24 0944  WBC 10.9* 10.3  --  12.7*  --   NEUTROABS 8.7* 8.3*  --   --   --   HGB 10.6* 9.5* 12.2* 10.4* 11.9*  HCT 36.2* 31.9* 36.0* 34.1* 35.0*  MCV 98.6 96.7  --  93.2   --   PLT 247 240  --  315  --    Blood Culture    Component Value Date/Time   SDES TISSUE 06/25/2024 1020   SPECREQUEST LEFT FOOT TISSUE 06/25/2024 1020   CULT  06/25/2024 1020    ABUNDANT STAPHYLOCOCCUS AUREUS RARE PSEUDOMONAS FLUORESCENS CRITICAL RESULT CALLED TO, READ BACK BY AND VERIFIED WITH: RN ANGEL M [id number] FCP SUSCEPTIBILITIES PERFORMED ON PREVIOUS CULTURE WITHIN THE LAST 5 DAYS. STAPHYLOCOCCUS AUREUS CULTURE REINCUBATED FOR BETTER GROWTH Performed at [facility], [address], [place], [place] [address]    REPTSTATUS PENDING 06/25/2024 1020    Cardiac Enzymes: No results for input(s): CKTOTAL, CKMB, CKMBINDEX, TROPONINI in the last 168 hours. CBG: Recent Labs  Lab 06/25/24 0843 06/25/24 1042 06/26/24 0900 06/26/24 0927 06/26/24 1211  GLUCAP 87 94 114* 107* 133*   Iron Studies: No results for input(s): IRON, TIBC, TRANSFERRIN, FERRITIN in the last  72 hours. Lab Results  Component Value Date   INR 1.2 05/13/2024   INR 1.1 05/19/2023   INR 1.1 03/06/2023   Studies/Results: No results found.   Medications:  sodium chloride  75 mL/hr at 06/25/24 0507   anticoagulant sodium citrate       (feeding supplement) PROSource Plus  30 mL Oral BID AC   amitriptyline   10 mg Oral QHS   amLODipine   10 mg Oral q morning   atorvastatin   40 mg Oral Daily   calcium  acetate  667 mg Oral TID WC   Chlorhexidine  Gluconate Cloth  6 each Topical Q0600   Chlorhexidine  Gluconate Cloth  6 each Topical Daily   doxycycline   100 mg Oral Q12H   feeding supplement  237 mL Oral BID BM   heparin   5,000 Units Subcutaneous Q8H   levothyroxine   50 mcg Oral QAC breakfast   metoprolol  tartrate  50 mg Oral BID   multivitamin  1 tablet Oral QHS   mupirocin  ointment  1 Application Nasal BID   nutrition supplement (JUVEN)  1 packet Oral BID BM   pantoprazole   40 mg Oral Daily   thiamine   100 mg Oral Daily    Dialysis Orders: MWF - [facility] 4hrs, BFR 400, DFR Auto 1.5,  EDW 63.8kg, 3K/ 2.5Ca Mircera 225 mcg q2wks - last 06/21/24  Assessment/Plan: Gangrene left/right foot, s/p b/l TMA - Podiatry and VVS following. S/p left-sided TMA revision 1/13, s/p rt bka 1/14. VVS following ESRD - on HD MWF sched, next on monday Hypertension/volume  - UF as tolerated Anemia of CKD - ESA given 1/9 in outpatient, not due yet, hgb 11.9-on target Secondary Hyperparathyroidism -  po4 acceptable Nutrition - Renal diet with fluid restriction   [name] Stank, MD [facility] 06/29/2024,9:25 AM  LOS: 6 days    "

## 2024-06-29 NOTE — Progress Notes (Signed)
" °   06/29/24 1223  Vitals  Temp 98 F (36.7 C)  BP 114/79  Pulse Rate 81  Resp 10  Weight 59.8 kg  Type of Weight Post-Dialysis  Oxygen Therapy  SpO2 100 %  O2 Device Room Air  Pulse Oximetry Type Continuous  Oximetry Probe Site Changed No  During Treatment Monitoring  HD Safety Checks Performed Yes  Intra-Hemodialysis Comments Tolerated well  Post Treatment  Dialyzer Clearance Lightly streaked  Hemodialysis Intake (mL) 0 mL  Liters Processed 75.5  Fluid Removed (mL) 2500 mL  Tolerated HD Treatment Yes  AVG/AVF Arterial Site Held (minutes) 0 minutes  AVG/AVF Venous Site Held (minutes) 0 minutes  Hemodialysis Catheter Right Internal jugular Double lumen Permanent (Tunneled)  Placement Date/Time: 01/10/24 1548   Placed prior to admission: No  Serial / Lot #: [id number]  Expiration Date: 08/10/28  Time Out: Correct patient;Correct site;Correct procedure  Maximum sterile barrier precautions: Hand hygiene;Large sterile sheet;S...  Site Condition No complications  Blue Lumen Status Flushed;Heparin  locked;Antimicrobial dead end cap  Red Lumen Status Flushed;Heparin  locked;Antimicrobial dead end cap  Purple Lumen Status N/A  Catheter fill solution Heparin  1000 units/ml  Catheter fill volume (Arterial) 1.6 cc  Catheter fill volume (Venous) 1.6  Dressing Type Transparent  Dressing Status Antimicrobial disc/dressing in place;Clean, Dry, Intact  Interventions Other (Comment) (Deaccessed.)  Drainage Description None  Dressing Change Due 07/01/24  Post treatment catheter status Capped and Clamped    "

## 2024-06-29 NOTE — TOC Progression Note (Signed)
 Transition of Care [facility]) - Progression Note    Patient Details  Name: [name] [name] MRN: [id number] Date of Birth: May 19, 1961  [facility] CM/SW Contact  Bridget Cordella Simmonds, LCSW Phone Number: 06/29/2024, 3:34 PM  Clinical Narrative:   Bed offer at [facility], medicare choice document placed in pt room, pt sleeping.  CSW spoke with his mother [name] and informed her of offers.  She may be interested in [facility], also asked about [facility].  Lives in [place], asking for close as possible.  CSW reached out to [facility] and [place] as well.      Expected Discharge Plan: Skilled Nursing Facility Barriers to Discharge: Continued Medical Work up, SNF Pending bed offer               Expected Discharge Plan and Services In-house Referral: Clinical Social Work   Post Acute Care Choice: Skilled Nursing Facility Living arrangements for the past 2 months: Single Family Home                                       Social Drivers of Health (SDOH) Interventions SDOH Screenings   Food Insecurity: No Food Insecurity (06/24/2024)  Housing: Low Risk (06/24/2024)  Transportation Needs: No Transportation Needs (06/24/2024)  Utilities: Not At Risk (06/24/2024)  Social Connections: Socially Isolated (06/24/2024)  Tobacco Use: Low Risk (06/26/2024)    Readmission Risk Interventions    03/28/2024    1:14 PM 03/29/2023    1:45 PM 03/07/2023    1:31 PM  Readmission Risk Prevention Plan  Transportation Screening Complete Complete Complete  HRI or Home Care Consult   Complete  Palliative Care Screening   Not Applicable  Medication Review (RN Care Manager) Complete Complete Complete  PCP or Specialist appointment within 3-5 days of discharge  Complete   HRI or Home Care Consult Complete Complete   SW Recovery Care/Counseling Consult Patient refused Complete   Palliative Care Screening Complete Complete   Skilled Nursing Facility Patient Refused Complete

## 2024-06-29 NOTE — Plan of Care (Signed)
   Problem: Coping: Goal: Ability to adjust to condition or change in health will improve Outcome: Progressing   Problem: Fluid Volume: Goal: Ability to maintain a balanced intake and output will improve Outcome: Progressing   Problem: Nutritional: Goal: Maintenance of adequate nutrition will improve Outcome: Progressing   Problem: Skin Integrity: Goal: Risk for impaired skin integrity will decrease Outcome: Progressing

## 2024-06-29 NOTE — Procedures (Signed)
 I was present at this dialysis session. I have reviewed the session itself and made appropriate changes.   Filed Weights   06/26/24 1250 06/26/24 1647 06/29/24 0821  Weight: 63.6 kg 61.4 kg 61.2 kg    Recent Labs  Lab 06/28/24 1421  NA 132*  K 4.1  CL 95*  CO2 23  GLUCOSE 136*  BUN 39*  CREATININE 5.08*  CALCIUM  9.1  PHOS 3.3    Recent Labs  Lab 06/23/24 1340 06/24/24 0545 06/25/24 0904 06/26/24 0848 06/26/24 0944  WBC 10.9* 10.3  --  12.7*  --   NEUTROABS 8.7* 8.3*  --   --   --   HGB 10.6* 9.5* 12.2* 10.4* 11.9*  HCT 36.2* 31.9* 36.0* 34.1* 35.0*  MCV 98.6 96.7  --  93.2  --   PLT 247 240  --  315  --     Scheduled Meds:  (feeding supplement) PROSource Plus  30 mL Oral BID AC   amitriptyline   10 mg Oral QHS   amLODipine   10 mg Oral q morning   atorvastatin   40 mg Oral Daily   calcium  acetate  667 mg Oral TID WC   Chlorhexidine  Gluconate Cloth  6 each Topical Q0600   Chlorhexidine  Gluconate Cloth  6 each Topical Daily   doxycycline   100 mg Oral Q12H   feeding supplement  237 mL Oral BID BM   heparin   5,000 Units Subcutaneous Q8H   levothyroxine   50 mcg Oral QAC breakfast   metoprolol  tartrate  50 mg Oral BID   multivitamin  1 tablet Oral QHS   mupirocin  ointment  1 Application Nasal BID   nutrition supplement (JUVEN)  1 packet Oral BID BM   pantoprazole   40 mg Oral Daily   thiamine   100 mg Oral Daily   Continuous Infusions:  sodium chloride  75 mL/hr at 06/25/24 0507   anticoagulant sodium citrate      PRN Meds:.acetaminophen  **OR** acetaminophen , alteplase , anticoagulant sodium citrate , heparin , hydrALAZINE , HYDROmorphone  (DILAUDID ) injection, lidocaine  (PF), lidocaine -prilocaine , ondansetron  **OR** ondansetron  (ZOFRAN ) IV, oxyCODONE , pentafluoroprop-tetrafluoroeth, phenol, polyethylene glycol, sodium chloride  flush   Ephriam Stank, MD [facility] 06/29/2024, 9:25 AM

## 2024-06-30 DIAGNOSIS — I96 Gangrene, not elsewhere classified: Secondary | ICD-10-CM | POA: Diagnosis not present

## 2024-06-30 LAB — AEROBIC/ANAEROBIC CULTURE W GRAM STAIN (SURGICAL/DEEP WOUND)
Gram Stain: NONE SEEN
Gram Stain: NONE SEEN

## 2024-06-30 LAB — VITAMIN C: Vitamin C: 0.1 mg/dL — ABNORMAL LOW (ref 0.4–2.0)

## 2024-06-30 MED ORDER — POLYETHYLENE GLYCOL 3350 17 G PO PACK
17.0000 g | PACK | Freq: Every day | ORAL | Status: DC
  Administered 2024-07-02 – 2024-07-07 (×3): 17 g via ORAL
  Filled 2024-06-30 (×7): qty 1

## 2024-06-30 MED ORDER — SENNOSIDES-DOCUSATE SODIUM 8.6-50 MG PO TABS
2.0000 | ORAL_TABLET | Freq: Every day | ORAL | Status: DC
  Administered 2024-07-04 – 2024-07-07 (×4): 2 via ORAL
  Filled 2024-06-30 (×6): qty 2

## 2024-06-30 NOTE — Progress Notes (Signed)
 " PROGRESS NOTE    ZAIVION KUNDRAT  FMW:[id number] DOB: [birth year] DOA: 06/23/2024  PCP: Kip Righter, MD   Brief Narrative:  Quamir Willemsen is a 64 y o man with poorly controlled type 2 diabetes mellitus, ESRD on hemodialysis (M/W/F schedule), severe peripheral arterial disease, hyperlipidemia, hypothyroidism, and cardiomyopathy who presents from podiatry clinic with bilateral foot gangrene and nonhealing wounds.  Patient underwent left foot ray amputation on 03/25/24 and Right TMA on 04/26/24.  Despite these intervention and multiple prior revascularization procedures he presented with dry gangrene/necrotic eschar at the right TMA site and nonhealing wounds at the left TMA site. His vascular history is extensive, including multiple lower extremity angiograms with interventions (01/30/2024, 03/20/2024, 04/16/2024) including peripheral atherectomy, intravascular lithotripsy, and angioplasty. He has bilateral AV grafts for dialysis access (right arm 08/17/2023, left arm 12/27/2022) and currently has a right internal jugular dialysis catheter.   Patient was admitted for further evaluation.  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   Assessment & Plan:   Principal Problem:   Gangrene of foot (HCC)  Dry gangrene / Necrotic eschar Right TMA site: Left foot nonhealing post TMA : Bilateral Osteomyelitis: Severe PAD / Limb ischemia: Continue empiric antibiotics (Vancomycin , cefepime , Flagyl ) MRI Right Foot > highly suspicious for bone infarcts and osteomyelitis.  Diffuse cellulitis and myofasciitis. MRI left foot > Open wounds along mid metatarsal surgical site, soft tissue inflammation consistent with tissue necrosis,  nonviable tissue.  Osteomyelitis. Patient underwent revision of left foot transmetatarsal to lisfranc amputation on 1/13 Patient underwent right BKA by vascular surgery on  1/14 Nonweightbearing bilateral lower extremity for now. Dressing removed and incision looks good.  Daily dressing changes recommended.  Follow-up outpatient in 4 to 6 weeks for staple removal  End-stage renal disease on hemodialysis MWF Nephrology is consulted for dialysis coordination. Monitor volume status, monitor electrolytes. Continue dialysis as per nephrology.  Last HD Saturday 1/17  Type 2 diabetes: Hold p.o. diabetic medications Continue regular insulin  sliding scale.  Chronic combined systolic and diastolic CHF: Appears euvolemic on exam. Chest x-ray showed cardiomegaly. Continue volume management with hemodialysis.  Hypothyroidism: Continue levothyroxine .  Essential hypertension: Continue amlodipine . Continue metoprolol .  GERD: Continue pantoprazole .   DVT prophylaxis: Heparin  sq Code Status: Full code Family Communication: Spoke with mother at bedside Disposition Plan:   Status is: Inpatient  Consultants:  Podiatry Vascular surgery Nephrology  Procedures:  Antimicrobials:  Anti-infectives (From admission, onward)    Start     Dose/Rate Route Frequency Ordered Stop   06/28/24 1200  vancomycin  (VANCOREADY) IVPB 750 mg/150 mL  Status:  Discontinued        750 mg 150 mL/hr over 60 Minutes Intravenous Every M-W-F (Hemodialysis) 06/26/24 1356 06/26/24 1410   06/28/24 1000  doxycycline  (VIBRA -TABS) tablet 100 mg        100 mg Oral Every 12 hours 06/28/24 0852 07/03/24 0959   06/26/24 1430  vancomycin  (VANCOREADY) IVPB 750 mg/150 mL  Status:  Discontinued        750 mg 150 mL/hr over 60 Minutes Intravenous Every M-W-F (Hemodialysis) 06/26/24 1410 06/28/24 0852   06/24/24 1200  vancomycin  (VANCOREADY) IVPB 750 mg/150 mL  Status:  Discontinued        750 mg 150 mL/hr over 60 Minutes Intravenous Every M-W-F (Hemodialysis) 06/23/24 2022 06/26/24 1356   06/23/24 2030  metroNIDAZOLE  (FLAGYL ) IVPB 500 mg  Status:  Discontinued  500 mg 100 mL/hr over 60  Minutes Intravenous Every 12 hours 06/23/24 2018 06/28/24 0852   06/23/24 2030  ceFEPIme  (MAXIPIME ) 1 g in sodium chloride  0.9 % 100 mL IVPB  Status:  Discontinued        1 g 200 mL/hr over 30 Minutes Intravenous Every 24 hours 06/23/24 2021 06/28/24 0852   06/23/24 2030  vancomycin  (VANCOREADY) IVPB 1500 mg/300 mL        1,500 mg 150 mL/hr over 120 Minutes Intravenous  Once 06/23/24 2022 06/24/24 0152      Subjective: Patient was seen and examined at bed side. Overnight events noted.  He underwent revision left foot transmetatarsal to lisfranc amputation 06/25/24. He underwent right BKA  06/26/24. POD # 2.  Dressing removed.  Wound looks good.   Patient is going to have dialysis today  Objective: Vitals:   06/29/24 1422 06/29/24 2006 06/30/24 0317 06/30/24 0757  BP: (!) 158/79 (!) 158/82 128/74 (!) 146/73  Pulse: 75 72 70 66  Resp: 16 15 15 17   Temp: 98.1 F (36.7 C) 98.9 F (37.2 C) 98.5 F (36.9 C) (!) 97.5 F (36.4 C)  TempSrc:  Oral  Oral  SpO2: 98% 97% 97% 99%  Weight:      Height:        Intake/Output Summary (Last 24 hours) at 06/30/2024 1302 Last data filed at 06/30/2024 0500 Gross per 24 hour  Intake 340 ml  Output --  Net 340 ml   Filed Weights   06/29/24 0821 06/29/24 1223 06/29/24 1245  Weight: 61.2 kg 59.8 kg 57.3 kg    General: Alert, awake, responsive  Eyes: Pupils equal, reactive  Oral cavity: moist mucous membranes  Head: Atraumatic, normocephalic  Neck: supple  Chest: clear to auscultation. No crackles, no wheezes  CVS: S1,S2 RRR. No murmurs  Abd: No distention, soft, non-tender. No masses palpable  Extr: No edema   MSK: L TMA, R AKA Neurological: Grossly intact.   Data Reviewed: I have personally reviewed following labs and imaging studies  CBC: Recent Labs  Lab 06/23/24 1340 06/24/24 0545 06/25/24 0904 06/26/24 0848 06/26/24 0944  WBC 10.9* 10.3  --  12.7*  --   NEUTROABS 8.7* 8.3*  --   --   --   HGB 10.6* 9.5* 12.2* 10.4* 11.9*   HCT 36.2* 31.9* 36.0* 34.1* 35.0*  MCV 98.6 96.7  --  93.2  --   PLT 247 240  --  315  --    Basic Metabolic Panel: Recent Labs  Lab 06/23/24 1340 06/24/24 0545 06/25/24 0904 06/26/24 0848 06/26/24 0944 06/28/24 1421  NA 138 138 133* 137 134* 132*  K 3.8 4.4 5.1 4.5 4.4 4.1  CL 99 100 99 97* 102 95*  CO2 25 23  --  22  --  23  GLUCOSE 236* 125* 95 135* 114* 136*  BUN 25* 33* 29* 39* 41* 39*  CREATININE 5.51* 6.36* 4.50* 5.38* 5.70* 5.08*  CALCIUM  8.6* 8.5*  --  8.9  --  9.1  MG  --  2.0  --   --   --   --   PHOS  --  3.4  --  5.2*  --  3.3   GFR: Estimated Creatinine Clearance: 11.7 mL/min (A) (by C-G formula based on SCr of 5.08 mg/dL (H)). Liver Function Tests: Recent Labs  Lab 06/24/24 0545 06/28/24 1421  AST 18  --   ALT 9  --   ALKPHOS 131*  --  BILITOT 0.3  --   PROT 6.3*  --   ALBUMIN  2.9* 2.9*   No results for input(s): LIPASE, AMYLASE in the last 168 hours. No results for input(s): AMMONIA in the last 168 hours. Coagulation Profile: No results for input(s): INR, PROTIME in the last 168 hours. Cardiac Enzymes: No results for input(s): CKTOTAL, CKMB, CKMBINDEX, TROPONINI in the last 168 hours. BNP (last 3 results) No results for input(s): PROBNP in the last 8760 hours. HbA1C: No results for input(s): HGBA1C in the last 72 hours. CBG: Recent Labs  Lab 06/25/24 1042 06/26/24 0900 06/26/24 0927 06/26/24 1211 06/29/24 1729  GLUCAP 94 114* 107* 133* 91   Lipid Profile: No results for input(s): CHOL, HDL, LDLCALC, TRIG, CHOLHDL, LDLDIRECT in the last 72 hours. Thyroid  Function Tests: No results for input(s): TSH, T4TOTAL, FREET4, T3FREE, THYROIDAB in the last 72 hours. Anemia Panel: No results for input(s): VITAMINB12, FOLATE, FERRITIN, TIBC, IRON, RETICCTPCT in the last 72 hours. Sepsis Labs: No results for input(s): PROCALCITON, LATICACIDVEN in the last 168 hours.  Recent Results (from  the past 240 hours)  Blood culture (routine x 2)     Status: None   Collection Time: 06/23/24  4:48 PM   Specimen: BLOOD  Result Value Ref Range Status   Specimen Description BLOOD SITE NOT SPECIFIED  Final   Special Requests   Final    BOTTLES DRAWN AEROBIC AND ANAEROBIC Blood Culture results may not be optimal due to an inadequate volume of blood received in culture bottles   Culture   Final    NO GROWTH 5 DAYS Performed at [facility], [address] [place], [place] [address]    Report Status 06/28/2024 FINAL  Final  Blood culture (routine x 2)     Status: None   Collection Time: 06/23/24  4:53 PM   Specimen: BLOOD  Result Value Ref Range Status   Specimen Description BLOOD SITE NOT SPECIFIED  Final   Special Requests   Final    BOTTLES DRAWN AEROBIC AND ANAEROBIC Blood Culture results may not be optimal due to an inadequate volume of blood received in culture bottles   Culture   Final    NO GROWTH 5 DAYS Performed at [facility], [address] [place], [place] [address]    Report Status 06/28/2024 FINAL  Final  Surgical pcr screen     Status: Abnormal   Collection Time: 06/25/24  2:26 AM   Specimen: Nasal Mucosa; Nasal Swab  Result Value Ref Range Status   MRSA, PCR POSITIVE (A) NEGATIVE Final   Staphylococcus aureus POSITIVE (A) NEGATIVE Final    Comment: RESULT CALLED TO, READ BACK BY AND VERIFIED WITH: A LYNPHACUM RN 06/25/2024 @ 0414 BY AB (NOTE) The Xpert SA Assay (FDA approved for NASAL specimens in patients 54 years of age and older), is one component of a comprehensive surveillance program. It is not intended to diagnose infection nor to guide or monitor treatment. Performed at [facility], [address], [place], [place] [address]   Aerobic/Anaerobic Culture w Gram Stain (surgical/deep wound)     Status: None (Preliminary result)   Collection Time: 06/25/24 10:19 AM   Specimen: PATH Soft tissue  Result Value Ref Range Status    Specimen Description TISSUE  Final   Special Requests LEFT FOOT ABSCESS  Final   Gram Stain   Final    NO WBC SEEN RARE GRAM POSITIVE COCCI Performed at [facility],  [address] [place], [place] [address]    Culture   Final    MODERATE METHICILLIN RESISTANT STAPHYLOCOCCUS AUREUS NO ANAEROBES ISOLATED; CULTURE IN PROGRESS FOR 5 DAYS    Report Status PENDING  Incomplete   Organism ID, Bacteria METHICILLIN RESISTANT STAPHYLOCOCCUS AUREUS  Final      Susceptibility   Methicillin resistant staphylococcus aureus - MIC*    CIPROFLOXACIN >=8 RESISTANT Resistant     ERYTHROMYCIN >=8 RESISTANT Resistant     GENTAMICIN <=0.5 SENSITIVE Sensitive     OXACILLIN >=4 RESISTANT Resistant     TETRACYCLINE <=1 SENSITIVE Sensitive     VANCOMYCIN  1 SENSITIVE Sensitive     TRIMETH/SULFA >=320 RESISTANT Resistant     CLINDAMYCIN <=0.25 SENSITIVE Sensitive     RIFAMPIN <=0.5 SENSITIVE Sensitive     Inducible Clindamycin NEGATIVE Sensitive     LINEZOLID 2 SENSITIVE Sensitive     * MODERATE METHICILLIN RESISTANT STAPHYLOCOCCUS AUREUS  Aerobic/Anaerobic Culture w Gram Stain (surgical/deep wound)     Status: None (Preliminary result)   Collection Time: 06/25/24 10:20 AM   Specimen: PATH Soft tissue  Result Value Ref Range Status   Specimen Description TISSUE  Final   Special Requests LEFT FOOT TISSUE  Final   Gram Stain NO WBC SEEN ABUNDANT GRAM POSITIVE COCCI   Final   Culture   Final    ABUNDANT STAPHYLOCOCCUS AUREUS RARE PSEUDOMONAS FLUORESCENS CRITICAL RESULT CALLED TO, READ BACK BY AND VERIFIED WITH: RN ANGEL M [id number] FCP SUSCEPTIBILITIES PERFORMED ON PREVIOUS CULTURE WITHIN THE LAST 5 DAYS. STAPHYLOCOCCUS AUREUS SUSCEPTIBILITIES TO FOLLOW Performed at [facility], [address], [place], [place] [address]    Report Status PENDING  Incomplete    Radiology Studies: No results found.  Scheduled Meds:  (feeding supplement) PROSource Plus  30 mL Oral BID AC    amitriptyline   10 mg Oral QHS   amLODipine   10 mg Oral q morning   atorvastatin   40 mg Oral Daily   calcium  acetate  667 mg Oral TID WC   Chlorhexidine  Gluconate Cloth  6 each Topical Q0600   doxycycline   100 mg Oral Q12H   feeding supplement  237 mL Oral BID BM   heparin   5,000 Units Subcutaneous Q8H   levothyroxine   50 mcg Oral QAC breakfast   metoprolol  tartrate  50 mg Oral BID   multivitamin  1 tablet Oral QHS   nutrition supplement (JUVEN)  1 packet Oral BID BM   pantoprazole   40 mg Oral Daily   polyethylene glycol  17 g Oral Daily   senna-docusate  2 tablet Oral Q1200   thiamine   100 mg Oral Daily   Continuous Infusions:  sodium chloride  75 mL/hr at 06/25/24 0507   anticoagulant sodium citrate        LOS: 7 days    Time spent: 35 mins    MDALA-GAUSI, GOLDEN PILLOW, MD [facility]   If 7PM-7AM, please contact night-coverage  "

## 2024-06-30 NOTE — Progress Notes (Signed)
 "  [facility] Progress Note   Subjective:    Seen in room. No complaints Tolerated HD yesterday with net uf 2.5L  Objective Vitals:   06/29/24 1422 06/29/24 2006 06/30/24 0317 06/30/24 0757  BP: (!) 158/79 (!) 158/82 128/74 (!) 146/73  Pulse: 75 72 70 66  Resp: 16 15 15 17   Temp: 98.1 F (36.7 C) 98.9 F (37.2 C) 98.5 F (36.9 C) (!) 97.5 F (36.4 C)  TempSrc:  Oral  Oral  SpO2: 98% 97% 97% 99%  Weight:      Height:       Physical Exam General: NAD Head: NCAT sclera not icteric  Lungs: unlabored, normal wob Heart: RRR Abdomen: soft and non-tender  Lower extremities: No LE edema; rt bka Neuro: awake, alert Dialysis Access: R [facility]  Filed Weights   06/29/24 0821 06/29/24 1223 06/29/24 1245  Weight: 61.2 kg 59.8 kg 57.3 kg    Intake/Output Summary (Last 24 hours) at 06/30/2024 0919 Last data filed at 06/30/2024 0500 Gross per 24 hour  Intake 340 ml  Output 2500 ml  Net -2160 ml    Additional Objective Labs: Basic Metabolic Panel: Recent Labs  Lab 06/24/24 0545 06/25/24 0904 06/26/24 0848 06/26/24 0944 06/28/24 1421  NA 138   < > 137 134* 132*  K 4.4   < > 4.5 4.4 4.1  CL 100   < > 97* 102 95*  CO2 23  --  22  --  23  GLUCOSE 125*   < > 135* 114* 136*  BUN 33*   < > 39* 41* 39*  CREATININE 6.36*   < > 5.38* 5.70* 5.08*  CALCIUM  8.5*  --  8.9  --  9.1  PHOS 3.4  --  5.2*  --  3.3   < > = values in this interval not displayed.   Liver Function Tests: Recent Labs  Lab 06/24/24 0545 06/28/24 1421  AST 18  --   ALT 9  --   ALKPHOS 131*  --   BILITOT 0.3  --   PROT 6.3*  --   ALBUMIN  2.9* 2.9*   No results for input(s): LIPASE, AMYLASE in the last 168 hours. CBC: Recent Labs  Lab 06/23/24 1340 06/24/24 0545 06/25/24 0904 06/26/24 0848 06/26/24 0944  WBC 10.9* 10.3  --  12.7*  --   NEUTROABS 8.7* 8.3*  --   --   --   HGB 10.6* 9.5* 12.2* 10.4* 11.9*  HCT 36.2* 31.9* 36.0* 34.1* 35.0*  MCV 98.6 96.7  --  93.2  --   PLT  247 240  --  315  --    Blood Culture    Component Value Date/Time   SDES TISSUE 06/25/2024 1020   SPECREQUEST LEFT FOOT TISSUE 06/25/2024 1020   CULT  06/25/2024 1020    ABUNDANT STAPHYLOCOCCUS AUREUS RARE PSEUDOMONAS FLUORESCENS CRITICAL RESULT CALLED TO, READ BACK BY AND VERIFIED WITH: RN ANGEL M [id number] FCP SUSCEPTIBILITIES PERFORMED ON PREVIOUS CULTURE WITHIN THE LAST 5 DAYS. STAPHYLOCOCCUS AUREUS SUSCEPTIBILITIES TO FOLLOW Performed at [facility], [address], [place], [place] [address]    REPTSTATUS PENDING 06/25/2024 1020    Cardiac Enzymes: No results for input(s): CKTOTAL, CKMB, CKMBINDEX, TROPONINI in the last 168 hours. CBG: Recent Labs  Lab 06/25/24 1042 06/26/24 0900 06/26/24 0927 06/26/24 1211 06/29/24 1729  GLUCAP 94 114* 107* 133* 91   Iron Studies: No results for input(s): IRON, TIBC, TRANSFERRIN, FERRITIN in the last 72 hours.  Lab Results  Component Value Date   INR 1.2 05/13/2024   INR 1.1 05/19/2023   INR 1.1 03/06/2023   Studies/Results: No results found.   Medications:  sodium chloride  75 mL/hr at 06/25/24 0507   anticoagulant sodium citrate       (feeding supplement) PROSource Plus  30 mL Oral BID AC   amitriptyline   10 mg Oral QHS   amLODipine   10 mg Oral q morning   atorvastatin   40 mg Oral Daily   calcium  acetate  667 mg Oral TID WC   Chlorhexidine  Gluconate Cloth  6 each Topical Q0600   Chlorhexidine  Gluconate Cloth  6 each Topical Daily   doxycycline   100 mg Oral Q12H   feeding supplement  237 mL Oral BID BM   heparin   5,000 Units Subcutaneous Q8H   levothyroxine   50 mcg Oral QAC breakfast   metoprolol  tartrate  50 mg Oral BID   multivitamin  1 tablet Oral QHS   mupirocin  ointment  1 Application Nasal BID   nutrition supplement (JUVEN)  1 packet Oral BID BM   pantoprazole   40 mg Oral Daily   thiamine   100 mg Oral Daily    Dialysis Orders: MWF - [facility] 4hrs, BFR 400, DFR Auto  1.5,  EDW 63.8kg, 3K/ 2.5Ca Mircera 225 mcg q2wks - last 06/21/24  Assessment/Plan: Gangrene left/right foot, s/p b/l TMA - Podiatry and VVS following. S/p left-sided TMA revision 1/13, s/p rt bka 1/14. VVS following ESRD - on HD MWF sched, next on monday Hypertension/volume  - UF as tolerated Anemia of CKD - ESA given 1/9 in outpatient, not due yet, hgb 11.9-on target Secondary Hyperparathyroidism -  po4 acceptable Nutrition - Renal diet with fluid restriction   Ephriam Stank, MD [facility] 06/30/2024,9:19 AM  LOS: 7 days    "

## 2024-07-01 DIAGNOSIS — I96 Gangrene, not elsewhere classified: Secondary | ICD-10-CM | POA: Diagnosis not present

## 2024-07-01 LAB — CBC
HCT: 35.6 % — ABNORMAL LOW (ref 39.0–52.0)
HCT: 39.8 % (ref 39.0–52.0)
Hemoglobin: 11 g/dL — ABNORMAL LOW (ref 13.0–17.0)
Hemoglobin: 12.1 g/dL — ABNORMAL LOW (ref 13.0–17.0)
MCH: 29 pg (ref 26.0–34.0)
MCH: 28.3 pg (ref 26.0–34.0)
MCHC: 30.9 g/dL (ref 30.0–36.0)
MCHC: 30.4 g/dL (ref 30.0–36.0)
MCV: 93.9 fL (ref 80.0–100.0)
MCV: 93 fL (ref 80.0–100.0)
Platelets: 205 K/uL (ref 150–400)
Platelets: 211 K/uL (ref 150–400)
RBC: 3.79 MIL/uL — ABNORMAL LOW (ref 4.22–5.81)
RBC: 4.28 MIL/uL (ref 4.22–5.81)
RDW: 16.3 % — ABNORMAL HIGH (ref 11.5–15.5)
RDW: 16.5 % — ABNORMAL HIGH (ref 11.5–15.5)
WBC: 12.1 K/uL — ABNORMAL HIGH (ref 4.0–10.5)
WBC: 11.4 K/uL — ABNORMAL HIGH (ref 4.0–10.5)
nRBC: 0 % (ref 0.0–0.2)
nRBC: 0 % (ref 0.0–0.2)

## 2024-07-01 LAB — BASIC METABOLIC PANEL WITH GFR
Anion gap: 16 — ABNORMAL HIGH (ref 5–15)
BUN: 40 mg/dL — ABNORMAL HIGH (ref 8–23)
CO2: 24 mmol/L (ref 22–32)
Calcium: 8.9 mg/dL (ref 8.9–10.3)
Chloride: 97 mmol/L — ABNORMAL LOW (ref 98–111)
Creatinine, Ser: 5.74 mg/dL — ABNORMAL HIGH (ref 0.61–1.24)
GFR, Estimated: 10 mL/min — ABNORMAL LOW
Glucose, Bld: 78 mg/dL (ref 70–99)
Potassium: 3.9 mmol/L (ref 3.5–5.1)
Sodium: 137 mmol/L (ref 135–145)

## 2024-07-01 LAB — MAGNESIUM: Magnesium: 2.4 mg/dL (ref 1.7–2.4)

## 2024-07-01 LAB — GLUCOSE, CAPILLARY
Glucose-Capillary: 70 mg/dL (ref 70–99)
Glucose-Capillary: 78 mg/dL (ref 70–99)
Glucose-Capillary: 105 mg/dL — ABNORMAL HIGH (ref 70–99)

## 2024-07-01 LAB — PHOSPHORUS: Phosphorus: 4.9 mg/dL — ABNORMAL HIGH (ref 2.5–4.6)

## 2024-07-01 MED ORDER — APIXABAN 2.5 MG PO TABS
2.5000 mg | ORAL_TABLET | Freq: Two times a day (BID) | ORAL | Status: DC
  Administered 2024-07-01 – 2024-07-07 (×13): 2.5 mg via ORAL
  Filled 2024-07-01 (×16): qty 1

## 2024-07-01 MED ORDER — HEPARIN SODIUM (PORCINE) 1000 UNIT/ML IJ SOLN
INTRAMUSCULAR | Status: AC
  Filled 2024-07-01: qty 4

## 2024-07-01 MED ORDER — ZINC OXIDE 40 % EX OINT
TOPICAL_OINTMENT | CUTANEOUS | Status: DC | PRN
  Filled 2024-07-01: qty 57

## 2024-07-01 MED ORDER — HYDROMORPHONE HCL 1 MG/ML IJ SOLN
INTRAMUSCULAR | Status: AC
  Filled 2024-07-01: qty 1

## 2024-07-01 NOTE — Progress Notes (Signed)
Pt agitated  

## 2024-07-01 NOTE — Progress Notes (Signed)
 Pt refused oral and axillary temp, mittens on pt unable to get pulse ox.

## 2024-07-01 NOTE — Progress Notes (Signed)
 " [facility] Progress Note   Subjective:    Seen and examined patient on HD. Tolerating UFG 2.5L. BP is 142/65. Denies SOB, CP, and N/V.   Objective Vitals:   06/30/24 2043 07/01/24 0313 07/01/24 0810 07/01/24 0830  BP: (!) 146/43 (!) 149/74 (!) 157/70 (!) 148/65  Pulse: 70 62 63 64  Resp: 18 19 (!) 9 11  Temp: 97.6 F (36.4 C) 97.7 F (36.5 C) 98.4 F (36.9 C)   TempSrc:  Oral Oral   SpO2: 99% 98%  97%  Weight:   62.1 kg   Height:       Physical Exam General: Awake, alert, NAD Head: NCAT sclera not icteric  Lungs: unlabored, normal wob Heart: RRR Abdomen: soft and non-tender  Lower extremities: No LE edema; rt bka Neuro: awake, alert Dialysis Access: R [facility]  Filed Weights   06/29/24 1223 06/29/24 1245 07/01/24 0810  Weight: 59.8 kg 57.3 kg 62.1 kg    Intake/Output Summary (Last 24 hours) at 07/01/2024 0904 Last data filed at 06/30/2024 1500 Gross per 24 hour  Intake 120 ml  Output --  Net 120 ml    Additional Objective Labs: Basic Metabolic Panel: Recent Labs  Lab 06/26/24 0848 06/26/24 0944 06/28/24 1421  NA 137 134* 132*  K 4.5 4.4 4.1  CL 97* 102 95*  CO2 22  --  23  GLUCOSE 135* 114* 136*  BUN 39* 41* 39*  CREATININE 5.38* 5.70* 5.08*  CALCIUM  8.9  --  9.1  PHOS 5.2*  --  3.3   Liver Function Tests: Recent Labs  Lab 06/28/24 1421  ALBUMIN  2.9*   No results for input(s): LIPASE, AMYLASE in the last 168 hours. CBC: Recent Labs  Lab 06/25/24 0904 06/26/24 0848 06/26/24 0944  WBC  --  12.7*  --   HGB 12.2* 10.4* 11.9*  HCT 36.0* 34.1* 35.0*  MCV  --  93.2  --   PLT  --  315  --    Blood Culture    Component Value Date/Time   SDES TISSUE 06/25/2024 1020   SPECREQUEST LEFT FOOT TISSUE 06/25/2024 1020   CULT  06/25/2024 1020    ABUNDANT STAPHYLOCOCCUS AUREUS RARE PSEUDOMONAS FLUORESCENS CRITICAL RESULT CALLED TO, READ BACK BY AND VERIFIED WITH: RN ANGEL M [id number] FCP SUSCEPTIBILITIES PERFORMED ON PREVIOUS  CULTURE WITHIN THE LAST 5 DAYS. STAPHYLOCOCCUS AUREUS    REPTSTATUS 06/30/2024 FINAL 06/25/2024 1020    Cardiac Enzymes: No results for input(s): CKTOTAL, CKMB, CKMBINDEX, TROPONINI in the last 168 hours. CBG: Recent Labs  Lab 06/25/24 1042 06/26/24 0900 06/26/24 0927 06/26/24 1211 06/29/24 1729  GLUCAP 94 114* 107* 133* 91   Iron Studies: No results for input(s): IRON, TIBC, TRANSFERRIN, FERRITIN in the last 72 hours. Lab Results  Component Value Date   INR 1.2 05/13/2024   INR 1.1 05/19/2023   INR 1.1 03/06/2023   Studies/Results: No results found.  Medications:  sodium chloride  75 mL/hr at 06/25/24 0507   anticoagulant sodium citrate       (feeding supplement) PROSource Plus  30 mL Oral BID AC   amitriptyline   10 mg Oral QHS   amLODipine   10 mg Oral q morning   atorvastatin   40 mg Oral Daily   calcium  acetate  667 mg Oral TID WC   Chlorhexidine  Gluconate Cloth  6 each Topical Q0600   doxycycline   100 mg Oral Q12H   feeding supplement  237 mL Oral BID BM   heparin   5,000 Units  Subcutaneous Q8H   levothyroxine   50 mcg Oral QAC breakfast   metoprolol  tartrate  50 mg Oral BID   multivitamin  1 tablet Oral QHS   nutrition supplement (JUVEN)  1 packet Oral BID BM   pantoprazole   40 mg Oral Daily   polyethylene glycol  17 g Oral Daily   senna-docusate  2 tablet Oral Q1200   thiamine   100 mg Oral Daily    Dialysis Orders: MWF - [facility] 4hrs, BFR 400, DFR Auto 1.5,  EDW 63.8kg, 3K/ 2.5Ca Mircera 225 mcg q2wks - last 06/21/24  Assessment/Plan: Gangrene left/right foot, s/p b/l TMA - Podiatry and VVS following. S/p left-sided TMA revision 1/13, s/p rt bka 1/14. VVS following ESRD - on HD MWF sched, on HD Hypertension/volume  - UF as tolerated Anemia of CKD - ESA given 1/9 in outpatient, not due yet, hgb 11.9-on target Secondary Hyperparathyroidism -  po4 acceptable Nutrition - Renal diet with fluid restriction  Charmaine Piety,  NP [facility] 07/01/2024,9:04 AM  LOS: 8 days    "

## 2024-07-01 NOTE — Hospital Course (Signed)
 [name] [name]

## 2024-07-01 NOTE — Progress Notes (Addendum)
 Received patient in bed to unit.  Alert and oriented.  Informed consent signed and in chart.   TX duration:3:30  Patient tolerated well.  Transported back to the room  Alert, without acute distress.  Hand-off given to patient's nurse.   Access used: catheter Access issues: NA  Total UF removed: 1.3L Medication(s) given: 1MG  Dilaudid   Post HD weight: 60.2KG   07/01/24 1205  Vitals  Temp (!) 97.4 F (36.3 C)  Temp Source Axillary  BP 131/74  MAP (mmHg) 91  BP Location Left Arm  BP Method Automatic  Patient Position (if appropriate) Lying  Pulse Rate 80  Pulse Rate Source Monitor  ECG Heart Rate 81  Resp 18  Weight 60.2 kg  Type of Weight Post-Dialysis  Oxygen Therapy  SpO2 99 %  O2 Device Room Air  During Treatment Monitoring  Blood Flow Rate (mL/min) 0 mL/min  Arterial Pressure (mmHg) -21.61 mmHg  Venous Pressure (mmHg) 184.23 mmHg  TMP (mmHg) -21.82 mmHg  Ultrafiltration Rate (mL/min) 0 mL/min  Dialysate Flow Rate (mL/min) 300 ml/min  Dialysate Potassium Concentration 3  Dialysate Calcium  Concentration 2.5  Duration of HD Treatment -hour(s) 3.5 hour(s)  Cumulative Fluid Removed (mL) per Treatment  1259.05  HD Safety Checks Performed Yes  Intra-Hemodialysis Comments Tx completed  Post Treatment  Dialyzer Clearance Clear  Hemodialysis Intake (mL) 0 mL  Liters Processed 84  Fluid Removed (mL) 1300 mL  Tolerated HD Treatment Yes  AVG/AVF Arterial Site Held (minutes) 0 minutes  AVG/AVF Venous Site Held (minutes) 0 minutes  Hemodialysis Catheter Right Internal jugular Double lumen Permanent (Tunneled)  Placement Date/Time: 01/10/24 1548   Placed prior to admission: No  Serial / Lot #: [id number]  Expiration Date: 08/10/28  Time Out: Correct patient;Correct site;Correct procedure  Maximum sterile barrier precautions: Hand hygiene;Large sterile sheet;S...  Site Condition No complications  Blue Lumen Status Heparin  locked  Red Lumen Status Heparin  locked  Catheter  fill solution Heparin  1000 units/ml  Catheter fill volume (Arterial) 1.6 cc  Catheter fill volume (Venous) 1.6  Dressing Type Transparent  Dressing Status Antimicrobial disc/dressing in place  Dressing Change Due 07/06/24  Post treatment catheter status Capped and Clamped      [name]

## 2024-07-01 NOTE — Progress Notes (Signed)
 " PROGRESS NOTE  [name] [name] FMW:[id number] DOB: 06/16/1960 DOA: 06/23/2024 PCP: Kip Righter, MD   LOS: 8 days   Brief narrative:  [name] [name] is a 64 y o male with poorly controlled type 2 diabetes mellitus, ESRD on hemodialysis (M/W/F schedule), severe peripheral arterial disease, hyperlipidemia, hypothyroidism, and cardiomyopathy who presented to podiatry clinic with bilateral foot gangrene and nonhealing wounds.  Patient had undergone left foot ray amputation on 03/25/24 and Right TMA on 04/26/24.  Despite these intervention and multiple prior revascularization procedures, patient presented with dry gangrene/necrotic eschar at the right TMA site and nonhealing wounds at the left TMA site.  Patient has had extensive vascular history including multiple lower extremity angiograms with interventions (01/30/2024, 03/20/2024, 04/16/2024) including peripheral atherectomy, intravascular lithotripsy, and angioplasty. He has bilateral AV grafts for dialysis access (right arm 08/17/2023, left arm 12/27/2022) and currently has a right internal jugular dialysis catheter.  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      Assessment/Plan: Principal Problem:   Gangrene of foot (HCC)  Dry gangrene / Necrotic eschar Right TMA  Left foot nonhealing post TMA : Bilateral Osteomyelitis: Severe PAD / Limb ischemia: Continue vancomycin  cefepime  and Flagyl . MRI of the right Foot highly suspicious for bone infarcts and osteomyelitis.  Diffuse cellulitis and myofasciitis.  MRI of the left foot showed open wounds,along mid metatarsal surgical site, soft tissue inflammation consistent with tissue necrosis,  nonviable tissue.  Osteomyelitis.  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  Plan is nonweightbearing bilateral  lower extremity.  Follow-up outpatient in 4 to 6 weeks for staple removal   End-stage renal disease on hemodialysis [facility] has been consulted for hemodialysis.  Continue dialysis as per nephrology.   Type 2 diabetes melitis: Continue sliding scale insulin .  Continue to monitor.   Chronic combined systolic and diastolic CHF: Appears euvolemic.  Chest x-ray showed cardiomegaly.  Continue volume management with hemodialysis.   Hypothyroidism: Continue Synthroid    Essential hypertension: Continue amlodipine  and metoprolol    GERD: Continue Protonix .  Debility deconditioning. PT recommends skilled nursing facility placement  DVT prophylaxis: heparin  injection 5,000 Units Start: 06/23/24 2200   Disposition: Skilled nursing facility as per PT evaluation  Status is: Inpatient Remains inpatient appropriate because: Need for skilled nursing facility placement.    Code Status:     Code Status: Full Code  Family Communication: Spoke with the patient's mother at bedside  Consultants: Nephrology  Procedures: Hemodialysis  Anti-infectives:  Vancomycin  IV on Monday Wednesday Friday  Anti-infectives (From admission, onward)    Start     Dose/Rate Route Frequency Ordered Stop   06/28/24 1200  vancomycin  (VANCOREADY) IVPB 750 mg/150 mL  Status:  Discontinued        750 mg 150 mL/hr over 60 Minutes Intravenous Every M-W-F (Hemodialysis) 06/26/24 1356 06/26/24 1410   06/28/24 1000  doxycycline  (VIBRA -TABS) tablet 100 mg        100 mg Oral Every 12 hours 06/28/24 0852 07/03/24 0959   06/26/24 1430  vancomycin  (VANCOREADY) IVPB 750 mg/150 mL  Status:  Discontinued        750 mg 150 mL/hr over 60 Minutes Intravenous Every M-W-F (Hemodialysis) 06/26/24 1410 06/28/24 0852   06/24/24 1200  vancomycin  (VANCOREADY) IVPB 750 mg/150 mL  Status:  Discontinued        750 mg  150 mL/hr over 60 Minutes Intravenous Every M-W-F (Hemodialysis) 06/23/24 2022 06/26/24 1356   06/23/24 2030   metroNIDAZOLE  (FLAGYL ) IVPB 500 mg  Status:  Discontinued        500 mg 100 mL/hr over 60 Minutes Intravenous Every 12 hours 06/23/24 2018 06/28/24 0852   06/23/24 2030  ceFEPIme  (MAXIPIME ) 1 g in sodium chloride  0.9 % 100 mL IVPB  Status:  Discontinued        1 g 200 mL/hr over 30 Minutes Intravenous Every 24 hours 06/23/24 2021 06/28/24 0852   06/23/24 2030  vancomycin  (VANCOREADY) IVPB 1500 mg/300 mL        1,500 mg 150 mL/hr over 120 Minutes Intravenous  Once 06/23/24 2022 06/24/24 0152       Subjective:  Today, patient was seen and examined at bedside.  Nursing staff reported that the patient has been refusing heparin  subcu injections and has been taking medication when crushed.  Patient not much interactive.  Patient's mother at bedside.  Patient feels as sleepy especially after dialysis.  Was seen after dialysis.  Objective: Vitals:   07/01/24 1210 07/01/24 1234  BP: 117/72 116/66  Pulse: 80 77  Resp: 14 15  Temp:  97.8 F (36.6 C)  SpO2: 100% 94%    Intake/Output Summary (Last 24 hours) at 07/01/2024 1346 Last data filed at 07/01/2024 1250 Gross per 24 hour  Intake 240 ml  Output 1300 ml  Net -1060 ml   Filed Weights   06/29/24 1245 07/01/24 0810 07/01/24 1205  Weight: 57.3 kg 62.1 kg 60.2 kg   Body mass index is 19.04 kg/m.   Physical Exam:  GENERAL: Patient is mildly somnolent awake on verbal command, minimally interactive, appears weak and deconditioned.  Not in obvious distress. HENT: No scleral pallor or icterus. Pupils equally reactive to light. Oral mucosa is moist NECK: is supple, no gross swelling noted.  Right internal jugular hemodialysis catheter in place CHEST: Clear to auscultation. No crackles or wheezes.  Diminished breath sounds bilaterally. CVS: S1 and S2 heard, no murmur. Regular rate and rhythm.  ABDOMEN: Soft, non-tender, bowel sounds are present. EXTREMITIES: Right above-knee amputation with bandage, left TMA. CNS: Cranial nerves are  intact. SKIN: warm and dry without rashes.  Data Review: I have personally reviewed the following laboratory data and studies,  CBC: Recent Labs  Lab 06/25/24 0904 06/26/24 0848 06/26/24 0944 07/01/24 0840  WBC  --  12.7*  --  12.1*  HGB 12.2* 10.4* 11.9* 11.0*  HCT 36.0* 34.1* 35.0* 35.6*  MCV  --  93.2  --  93.9  PLT  --  315  --  205   Basic Metabolic Panel: Recent Labs  Lab 06/25/24 0904 06/26/24 0848 06/26/24 0944 06/28/24 1421 07/01/24 0840  NA 133* 137 134* 132* 137  K 5.1 4.5 4.4 4.1 3.9  CL 99 97* 102 95* 97*  CO2  --  22  --  23 24  GLUCOSE 95 135* 114* 136* 78  BUN 29* 39* 41* 39* 40*  CREATININE 4.50* 5.38* 5.70* 5.08* 5.74*  CALCIUM   --  8.9  --  9.1 8.9  MG  --   --   --   --  2.4  PHOS  --  5.2*  --  3.3 4.9*   Liver Function Tests: Recent Labs  Lab 06/28/24 1421  ALBUMIN  2.9*   No results for input(s): LIPASE, AMYLASE in the last 168 hours. No results for input(s): AMMONIA in the last 168 hours. Cardiac  Enzymes: No results for input(s): CKTOTAL, CKMB, CKMBINDEX, TROPONINI in the last 168 hours. BNP (last 3 results) No results for input(s): BNP in the last 8760 hours.  ProBNP (last 3 results) No results for input(s): PROBNP in the last 8760 hours.  CBG: Recent Labs  Lab 06/26/24 0927 06/26/24 1211 06/29/24 1729 07/01/24 1236 07/01/24 1259  GLUCAP 107* 133* 91 70 78   Recent Results (from the past 240 hours)  Blood culture (routine x 2)     Status: None   Collection Time: 06/23/24  4:48 PM   Specimen: BLOOD  Result Value Ref Range Status   Specimen Description BLOOD SITE NOT SPECIFIED  Final   Special Requests   Final    BOTTLES DRAWN AEROBIC AND ANAEROBIC Blood Culture results may not be optimal due to an inadequate volume of blood received in culture bottles   Culture   Final    NO GROWTH 5 DAYS Performed at [facility], [address], [place], [place] [address]    Report Status 06/28/2024 FINAL   Final  Blood culture (routine x 2)     Status: None   Collection Time: 06/23/24  4:53 PM   Specimen: BLOOD  Result Value Ref Range Status   Specimen Description BLOOD SITE NOT SPECIFIED  Final   Special Requests   Final    BOTTLES DRAWN AEROBIC AND ANAEROBIC Blood Culture results may not be optimal due to an inadequate volume of blood received in culture bottles   Culture   Final    NO GROWTH 5 DAYS Performed at [facility], [address], [place], [place] [address]    Report Status 06/28/2024 FINAL  Final  Surgical pcr screen     Status: Abnormal   Collection Time: 06/25/24  2:26 AM   Specimen: Nasal Mucosa; Nasal Swab  Result Value Ref Range Status   MRSA, PCR POSITIVE (A) NEGATIVE Final   Staphylococcus aureus POSITIVE (A) NEGATIVE Final    Comment: RESULT CALLED TO, READ BACK BY AND VERIFIED WITH: A LYNPHACUM RN 06/25/2024 @ 0414 BY AB (NOTE) The Xpert SA Assay (FDA approved for NASAL specimens in patients 32 years of age and older), is one component of a comprehensive surveillance program. It is not intended to diagnose infection nor to guide or monitor treatment. Performed at [facility], [address], [place], [place] [address]   Aerobic/Anaerobic Culture w Gram Stain (surgical/deep wound)     Status: None   Collection Time: 06/25/24 10:19 AM   Specimen: PATH Soft tissue  Result Value Ref Range Status   Specimen Description TISSUE  Final   Special Requests LEFT FOOT ABSCESS  Final   Gram Stain NO WBC SEEN RARE GRAM POSITIVE COCCI   Final   Culture   Final    MODERATE METHICILLIN RESISTANT STAPHYLOCOCCUS AUREUS NO ANAEROBES ISOLATED Performed at [facility], [address], [place], [place] [address]    Report Status 06/30/2024 FINAL  Final   Organism ID, Bacteria METHICILLIN RESISTANT STAPHYLOCOCCUS AUREUS  Final      Susceptibility   Methicillin resistant staphylococcus aureus - MIC*    CIPROFLOXACIN >=8 RESISTANT Resistant      ERYTHROMYCIN >=8 RESISTANT Resistant     GENTAMICIN <=0.5 SENSITIVE Sensitive     OXACILLIN >=4 RESISTANT Resistant     TETRACYCLINE <=1 SENSITIVE Sensitive     VANCOMYCIN  1 SENSITIVE Sensitive     TRIMETH/SULFA >=320 RESISTANT Resistant  CLINDAMYCIN <=0.25 SENSITIVE Sensitive     RIFAMPIN <=0.5 SENSITIVE Sensitive     Inducible Clindamycin NEGATIVE Sensitive     LINEZOLID 2 SENSITIVE Sensitive     * MODERATE METHICILLIN RESISTANT STAPHYLOCOCCUS AUREUS  Aerobic/Anaerobic Culture w Gram Stain (surgical/deep wound)     Status: None   Collection Time: 06/25/24 10:20 AM   Specimen: PATH Soft tissue  Result Value Ref Range Status   Specimen Description TISSUE  Final   Special Requests LEFT FOOT TISSUE  Final   Gram Stain   Final    NO WBC SEEN ABUNDANT GRAM POSITIVE COCCI Performed at [facility], [address], [place], [place] [address]    Culture   Final    ABUNDANT STAPHYLOCOCCUS AUREUS RARE PSEUDOMONAS FLUORESCENS CRITICAL RESULT CALLED TO, READ BACK BY AND VERIFIED WITH: RN ANGEL M [id number] [id number] [facility] SUSCEPTIBILITIES PERFORMED ON PREVIOUS CULTURE WITHIN THE LAST 5 DAYS. STAPHYLOCOCCUS AUREUS    Report Status 06/30/2024 FINAL  Final   Organism ID, Bacteria PSEUDOMONAS FLUORESCENS  Final      Susceptibility   Pseudomonas fluorescens - MIC*    MEROPENEM <=0.25 SENSITIVE Sensitive     CIPROFLOXACIN <=0.06 SENSITIVE Sensitive     PIP/TAZO Value in next row Sensitive      8 SENSITIVEThis is a modified FDA-approved test that has been validated and its performance characteristics determined by the reporting laboratory.  This laboratory is certified under the Clinical Laboratory Improvement Amendments CLIA as qualified to perform high complexity clinical laboratory testing.    CEFEPIME  Value in next row Sensitive      8 SENSITIVEThis is a modified FDA-approved test that has been validated and its performance characteristics determined by the reporting laboratory.  This laboratory  is certified under the Clinical Laboratory Improvement Amendments CLIA as qualified to perform high complexity clinical laboratory testing.    * RARE PSEUDOMONAS FLUORESCENS     Studies: No results found.    Duel Conrad, MD  [facility] 07/01/2024  If 7PM-7AM, please contact night-coverage             "

## 2024-07-01 NOTE — Plan of Care (Signed)
  Problem: Education: Goal: Knowledge of General Education information will improve Description: Including pain rating scale, medication(s)/side effects and non-pharmacologic comfort measures Outcome: Progressing   Problem: Clinical Measurements: Goal: Ability to maintain clinical measurements within normal limits will improve Outcome: Progressing   Problem: Coping: Goal: Level of anxiety will decrease Outcome: Progressing   Problem: Pain Managment: Goal: General experience of comfort will improve and/or be controlled Outcome: Progressing   Problem: Safety: Goal: Ability to remain free from injury will improve Outcome: Progressing

## 2024-07-01 NOTE — TOC Progression Note (Addendum)
 Transition of Care [facility]) - Progression Note    Patient Details  Name: [name] [name] MRN: [id number] Date of Birth: [birth year]  [facility] CM/SW Contact  Bridget Cordella Simmonds, LCSW Phone Number: 07/01/2024, 3:17 PM  Clinical Narrative:   [facility] cannot offer bed.  No response from [facility] after several messages.    Pt remains oriented x1, discussed bed offers with pt mother, who is in the room, and she will accept offer at [facility].  Rhonda/[facility] confirms they can do pt current HD schedule at [facility].  She does not have bed today, may be several days until bed available.  SNF auth request made to [facility].     Expected Discharge Plan: Skilled Nursing Facility Barriers to Discharge: Continued Medical Work up, SNF Pending bed offer               Expected Discharge Plan and Services In-house Referral: Clinical Social Work   Post Acute Care Choice: Skilled Nursing Facility Living arrangements for the past 2 months: Single Family Home                                       Social Drivers of Health (SDOH) Interventions SDOH Screenings   Food Insecurity: No Food Insecurity (06/24/2024)  Housing: Low Risk (06/24/2024)  Transportation Needs: No Transportation Needs (06/24/2024)  Utilities: Not At Risk (06/24/2024)  Social Connections: Socially Isolated (06/24/2024)  Tobacco Use: Low Risk (06/26/2024)    Readmission Risk Interventions    03/28/2024    1:14 PM 03/29/2023    1:45 PM 03/07/2023    1:31 PM  Readmission Risk Prevention Plan  Transportation Screening Complete Complete Complete  HRI or Home Care Consult   Complete  Palliative Care Screening   Not Applicable  Medication Review (RN Care Manager) Complete Complete Complete  PCP or Specialist appointment within 3-5 days of discharge  Complete   HRI or Home Care Consult Complete Complete   SW Recovery Care/Counseling Consult Patient refused Complete   Palliative Care Screening  Complete Complete   Skilled Nursing Facility Patient Refused Complete

## 2024-07-01 NOTE — Procedures (Addendum)
 HD Note:  Some information was entered later than the data was gathered due to patient care needs. The stated time with the data is accurate.  Received patient in bed to unit.   Alert and unable to express needs effectively. Patient answered all questions with No, but would participate without issue when told what would happen next  Informed consent signed and in chart.   Access used: Upper right chest Access issues: None  Patient started to moan. He was agitated and attempted to remove monitoring. Patient was unable to answer any questions as to what was wrong. Patient's nurse had stated in report that this behavior had meant that he was in pain. Medication given to allow treatment to continue. See MAR BP lowered with medication.  UF paused and patient received NS bolus.  See Flowsheet   TX duration: 3.5 hours  Alert, without acute distress.  Total UF removed: 1300 ml  Hand-off given to patient's nurse.   Transported back to the room   Shakemia Madera L. Lenon, RN Kidney Dialysis Unit.    TX duration: 3.5 hours  Alert, without acute distress.  Total UF removed: 1300 ml  Hand-off given to patient's nurse.   Transported back to the room   Pressley Tadesse L. Lenon, RN Kidney Dialysis Unit.    Dalayna Lauter L. Lenon, RN [facility]ney Dialysis Unit.

## 2024-07-02 ENCOUNTER — Telehealth (HOSPITAL_COMMUNITY): Payer: Self-pay

## 2024-07-02 ENCOUNTER — Other Ambulatory Visit (HOSPITAL_COMMUNITY): Payer: Self-pay

## 2024-07-02 DIAGNOSIS — I96 Gangrene, not elsewhere classified: Secondary | ICD-10-CM | POA: Diagnosis not present

## 2024-07-02 LAB — CBC WITH DIFFERENTIAL/PLATELET
Abs Immature Granulocytes: 0.05 K/uL (ref 0.00–0.07)
Basophils Absolute: 0 K/uL (ref 0.0–0.1)
Basophils Relative: 0 %
Eosinophils Absolute: 0.2 K/uL (ref 0.0–0.5)
Eosinophils Relative: 2 %
HCT: 41.8 % (ref 39.0–52.0)
Hemoglobin: 12.9 g/dL — ABNORMAL LOW (ref 13.0–17.0)
Immature Granulocytes: 1 %
Lymphocytes Relative: 13 %
Lymphs Abs: 1.4 K/uL (ref 0.7–4.0)
MCH: 28.7 pg (ref 26.0–34.0)
MCHC: 30.9 g/dL (ref 30.0–36.0)
MCV: 93.1 fL (ref 80.0–100.0)
Monocytes Absolute: 0.7 K/uL (ref 0.1–1.0)
Monocytes Relative: 6 %
Neutro Abs: 8.7 K/uL — ABNORMAL HIGH (ref 1.7–7.7)
Neutrophils Relative %: 78 %
Platelets: 204 K/uL (ref 150–400)
RBC: 4.49 MIL/uL (ref 4.22–5.81)
RDW: 16.3 % — ABNORMAL HIGH (ref 11.5–15.5)
WBC: 11 K/uL — ABNORMAL HIGH (ref 4.0–10.5)
nRBC: 0 % (ref 0.0–0.2)

## 2024-07-02 LAB — BASIC METABOLIC PANEL WITH GFR
Anion gap: 17 — ABNORMAL HIGH (ref 5–15)
BUN: 18 mg/dL (ref 8–23)
CO2: 24 mmol/L (ref 22–32)
Calcium: 9 mg/dL (ref 8.9–10.3)
Chloride: 92 mmol/L — ABNORMAL LOW (ref 98–111)
Creatinine, Ser: 3.88 mg/dL — ABNORMAL HIGH (ref 0.61–1.24)
GFR, Estimated: 16 mL/min — ABNORMAL LOW
Glucose, Bld: 93 mg/dL (ref 70–99)
Potassium: 4.1 mmol/L (ref 3.5–5.1)
Sodium: 133 mmol/L — ABNORMAL LOW (ref 135–145)

## 2024-07-02 MED ORDER — VITAMIN C 500 MG PO TABS
250.0000 mg | ORAL_TABLET | Freq: Two times a day (BID) | ORAL | Status: DC
  Administered 2024-07-02 – 2024-07-07 (×8): 250 mg via ORAL
  Filled 2024-07-02 (×13): qty 1

## 2024-07-02 MED ORDER — CHLORHEXIDINE GLUCONATE CLOTH 2 % EX PADS
6.0000 | MEDICATED_PAD | Freq: Every day | CUTANEOUS | Status: DC
  Administered 2024-07-03 – 2024-07-04 (×2): 6 via TOPICAL

## 2024-07-02 NOTE — Progress Notes (Addendum)
 Physical Therapy Treatment Patient Details Name: [name] [name] MRN: [id number] DOB: [birth year] Today's Date: 07/02/2024   History of Present Illness 64 yrs old male with  who presents from podiatry clinic with bilateral foot gangrene and nonhealing wounds. Patient underwent left foot ray amputation on 03/25/24 and Right TMA on 04/26/24.  Despite these intervention and multiple prior revascularization procedures he now presents with dry gangrene/necrotic eschar at the right TMA site and nonhealing wounds at the left TMA site. MRI Right Foot > highly suspicious for bone infarcts and osteomyelitis. Diffuse cellulitis and myofasciitis. MRI left foot > Open wounds along mid metatarsal surgical site, soft tissue inflammation consistent with tissue necrosis, nonviable tissue, osteomyelitis.  Patient underwent revision of left foot transmetatarsal to lisfranc amputation. 06/26/23. 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 PMH significant for poorly controlled type 2 diabetes mellitus, End-stage renal disease on hemodialysis (M/W/F schedule), severe peripheral arterial disease, hyperlipidemia, hypothyroidism, and cardiomyopathy.    PT Comments  Pt received in supine, legs toward L side of bed and head toward R bed rail, pt A&O to self/time only but unclear of his baseline as pt familiar to this therapist from prior admission in 03/2023 and presented similarly on that admission, with emotional lability and variable command following. Pt cooperative and following >50% of simple motor commands this session once mitts doffed, pt appreciative of having mitts off, but does c/o sensory sensitivity from hospital gown and is pulling on clothing. Pt needing up to TotalA for attempts at long sitting in bed with end of bed lowered and HOB elevated and side rail assist, and up to modA for rolling fully to L/R sides while PTA adjusting bed linens/pad. MaxA to scoot up in bed with bed in trend with pt assisting with  BUE via overhead rail. Pt with c/o BLE tenderness, RLE/LLE to even light touch/pressure from blankets. Pt with redness to bottom skin, RN/MD notified via secure chat he would benefit from air mattress replacement to ensure he is getting adequate pressure relief. PTA re-donned mitts at end of session per RN request as he remains restless and pulling frequently on his hospital gown. Patient will benefit from continued inpatient follow up therapy, <3 hours/day.   He remains below prior reported baseline and likely to need mechanical lift for OOB to chair transfer next session if cognitive status allows, would be good to coordinate with his mother so she can be present to ensure pt safety when he is OOB initially.     If plan is discharge home, recommend the following: Two people to help with walking and/or transfers;Two people to help with bathing/dressing/bathroom;Help with stairs or ramp for entrance;Assist for transportation;Assistance with cooking/housework;Direct supervision/assist for medications management;Direct supervision/assist for financial management;Supervision due to cognitive status   Can travel by private vehicle     No  Equipment Recommendations  None recommended by PT (currently would need hoyer lift, hospital air bed, drop arm wheelchair with modification for his R BKA)    Recommendations for Other Services       Precautions / Restrictions Precautions Precautions: Fall Recall of Precautions/Restrictions: Impaired Precaution/Restrictions Comments: Contact precs; poor recall of BLE NWB status; lability Restrictions Weight Bearing Restrictions Per Provider Order: Yes RLE Weight Bearing Per Provider Order: Non weight bearing LLE Weight Bearing Per Provider Order: Non weight bearing Other Position/Activity Restrictions: pt often non-compliant in supine     Mobility  Bed Mobility Overal bed mobility: Needs Assistance Bed Mobility: Rolling, Supine to Sit Rolling: Mod assist,  Used rails   Supine to sit: HOB elevated, Used rails, Total assist     General bed mobility comments: Cues for rolling to L then to R side with assist of bedrails toward his R side, pt restless and with limited UE effort to R rail when rolling to that side, needing modA to achieve sidelying briefly for bed pad to be straightened/adjusted. RN notified pt bottom skin appears reddened after session. With bed in trend, pt able to reach overhead rail with BUE and PTA providing maxA bed pad assist to slide up higher toward HOB. Then pt bed placed in chair posture with HOB >40 degrees, and with cues to use bedrails to pull up into long sitting, pt elevates shoulders off bed but needs TotalA to achieve nearly upright long sitting, pt anxious and pushing back to supine/elevated bed chair posture. Pt encouraged to keep HOB up to build tolerance for EOB and possible hoyer OOB to chair next session if pt has family or staff supervision for safety given his impulsivity/restlessness.    Transfers                   General transfer comment: deferred d/t pt restlessness/impulsivity, likely +2 and possible need for mechanical lift secondary to pain/intellectual disability.    Ambulation/Gait                   Stairs             Wheelchair Mobility     Tilt Bed    Modified Rankin (Stroke Patients Only)       Balance Overall balance assessment: Needs assistance Sitting-balance support: Bilateral upper extremity supported, Feet unsupported Sitting balance-Leahy Scale: Zero Sitting balance - Comments: with BUE support of side rails and TotalA trunk support, pt unable to maintain long sitting in bed (with foot part of bed lowered to reduce pt discomfort) for >1-2 seconds; PTA defer EOB due to lack of +2 assist and pt cog deficit. Postural control: Posterior lean     Standing balance comment: pt NWB BLE                            Communication  Communication Communication: No apparent difficulties Factors Affecting Communication:  (per prior admissions, pt appears to have some intellectual disability at baseline, although it's not listed in his medical history)  Cognition Arousal: Alert Behavior During Therapy: Restless, Flat affect, Lability   PT - Cognitive impairments: No family/caregiver present to determine baseline, Safety/Judgement, Problem solving, Initiation, Awareness, Orientation, Attention, Sequencing   Orientation impairments: Place, Situation                   PT - Cognition Comments: Pt familiar to this therapist from admission 03/2023, also presented on that admission with emotional lability and restlessness and poor attention to task. Pt mother not present to report on his recent baseline, but pt able to state time of day/hour of day, he refuses to report location (pt reoriented to hospital and recent surgery/NWB status by PTA), pt not attempting to pull on IV or HD cath while mitts doffed during session. Pt with sensory sensitivity and is pulling on top/neckline of hospital gown and states it's going to choke me although it is very loosely tied and when pt tugs on it, it is loose to nipple line of his chest, not snug at all. Pt may be more comfortable with loose-fitting t-shirt from home if  his mother can bring in tshirt/loose fitting shorts for therapy sessions. RN requesting PTA re-don mitts at end of session as per her report, he has been inconsistently oriented and has been attempting to pull on his lines earlier in the day. Pt frustrated when PTA re-dons mitts for him at end of session. Following commands: Impaired Following commands impaired: Follows one step commands inconsistently, Only follows one step commands consistently    Cueing Cueing Techniques: Verbal cues, Gestural cues, Tactile cues, Visual cues  Exercises Other Exercises Other Exercises: supine BLE A/AAROM: SLR x5 reps ea (mostly AROM, AA at  times for initiation), R knee extension ~3 reps with AA to initiate, pt defers more due to pain on RLE Other Exercises: cues for bed crunches pulling to long sit with bil side rails, pt only performs x2 reps but does not appear to be putting forth maximal effort, needs totalA trunk lift assist to achieve long sitting    General Comments General comments (skin integrity, edema, etc.): no acute s/sx distress other than c/o pain. Pt with increased frustration when PTA notified him that mitts need to be redonned, as RN sees how he is restless in bed and doesn't want him pulling out his HD cath or PIV lines. Some redness around his bottom, RN notified, MD notified pt would benefit from mattress replacement/air bed to reduce pressure as pt has cog deficit and not adequately notifying staff when he needs pressure relief/linen changes.      Pertinent Vitals/Pain Pain Assessment Pain Assessment: Faces Faces Pain Scale: Hurts even more Breathing: occasional labored breathing, short period of hyperventilation Negative Vocalization: occasional moan/groan, low speech, negative/disapproving quality Facial Expression: facial grimacing Body Language: tense, distressed pacing, fidgeting Consolability: distracted or reassured by voice/touch PAINAD Score: 6 Pain Location: BLE R>L Pain Descriptors / Indicators: Grimacing, Moaning, Operative site guarding, Restless, Discomfort Pain Intervention(s): Limited activity within patient's tolerance, Monitored during session, Repositioned, Patient requesting pain meds-RN notified    Home Living                          Prior Function            PT Goals (current goals can now be found in the care plan section) Acute Rehab PT Goals Patient Stated Goal: For pain to go down and to be able to go home. PT Goal Formulation: With patient Time For Goal Achievement: 07/12/24 Progress towards PT goals: Progressing toward goals (slowly)    Frequency     Min 2X/week      PT Plan      Co-evaluation              AM-PAC PT 6 Clicks Mobility   Outcome Measure  Help needed turning from your back to your side while in a flat bed without using bedrails?: A Lot Help needed moving from lying on your back to sitting on the side of a flat bed without using bedrails?: Total Help needed moving to and from a bed to a chair (including a wheelchair)?: Total Help needed standing up from a chair using your arms (e.g., wheelchair or bedside chair)?: Total Help needed to walk in hospital room?: Total Help needed climbing 3-5 steps with a railing? : Total 6 Click Score: 7    End of Session Equipment Utilized During Treatment: Other (comment) (bed pad assist) Activity Tolerance: Patient limited by pain;Other (comment) (apparent intellectual disability (similar to prior admissions), anxiety/pain) Patient left: in bed;with  call bell/phone within reach;with bed alarm set;Other (comment) (HOB >45 deg and bed in chair posture to improve pulmonary clearance; mitts donned per RN request; L heel floated, RLE slightly elevated with folded blankets under knee and thigh to promote R knee extension and elevation) Nurse Communication: Mobility status;Precautions;Other (comment) (pt bottom appears reddened; RN/MD notified PTA recommending mattress replacement given pt cog deficit and not relieving pressure well on his own) PT Visit Diagnosis: Other abnormalities of gait and mobility (R26.89);Muscle weakness (generalized) (M62.81);Pain Pain - Right/Left:  (BLE) Pain - part of body: Knee;Leg;Ankle and joints of foot (LLE foot/ankle, RLE distal incision and knee)     Time: 1735-1755 PT Time Calculation (min) (ACUTE ONLY): 20 min  Charges:    $Therapeutic Activity: 8-22 mins PT General Charges $$ ACUTE PT VISIT: 1 Visit                     Novali Vollman P., PTA Acute Rehabilitation Services Secure Chat Preferred 9a-5:30pm Office: [phone]    Connell HERO  [facility] 07/02/2024, 7:08 PM

## 2024-07-02 NOTE — TOC Progression Note (Signed)
 Transition of Care [facility]) - Progression Note    Patient Details  Name: [name] [name] MRN: [id number] Date of Birth: 01/15/1961  Transition of Care [facility]) CM/SW Contact  Bridget Cordella Simmonds, LCSW Phone Number: 07/02/2024, 3:13 PM  Clinical Narrative:   CSW spoke with Tammy/HTA: Dr Janit will not approve currently due to pt delirium and being in restraints (mitts).  They are requesting auth request be resubmitted when pt has improved.  MD notified.    Expected Discharge Plan: Skilled Nursing Facility Barriers to Discharge: Continued Medical Work up, SNF Pending bed offer               Expected Discharge Plan and Services In-house Referral: Clinical Social Work   Post Acute Care Choice: Skilled Nursing Facility Living arrangements for the past 2 months: Single Family Home                                       Social Drivers of Health (SDOH) Interventions SDOH Screenings   Food Insecurity: No Food Insecurity (06/24/2024)  Housing: Low Risk (06/24/2024)  Transportation Needs: No Transportation Needs (06/24/2024)  Utilities: Not At Risk (06/24/2024)  Social Connections: Socially Isolated (06/24/2024)  Tobacco Use: Low Risk (06/26/2024)    Readmission Risk Interventions    03/28/2024    1:14 PM 03/29/2023    1:45 PM 03/07/2023    1:31 PM  Readmission Risk Prevention Plan  Transportation Screening Complete Complete Complete  HRI or Home Care Consult   Complete  Palliative Care Screening   Not Applicable  Medication Review (RN Care Manager) Complete Complete Complete  PCP or Specialist appointment within 3-5 days of discharge  Complete   HRI or Home Care Consult Complete Complete   SW Recovery Care/Counseling Consult Patient refused Complete   Palliative Care Screening Complete Complete   Skilled Nursing Facility Patient Refused Complete

## 2024-07-02 NOTE — Progress Notes (Signed)
 Nutrition Follow-up  DOCUMENTATION CODES:   Severe malnutrition in context of chronic illness  INTERVENTION:   Encourage po intake: Liberalize diet to promote po intake, continue 2 L fluid restriction per MD Nursing to assist with meals Automatic trays  1 packet Juven BID, each packet provides 95 calories, 2.5 grams of protein (collagen),to support wound healing Ensure Plus High Protein po BID, each supplement provides 350 kcal and 20 grams of protein Prosource Plus BID, each supplement provides 100 kcal, 15 grams protein Magic cup TID with meals, each supplement provides 290 kcal and 9 grams of protein Renal MVI  daily 100 mg Thiamine  daily  250 mg Vitamin C  BID x 30 days for deficiency (Ends 2/19) After supplementation recommend rechecking labs   NUTRITION DIAGNOSIS:   Severe Malnutrition related to chronic illness as evidenced by severe muscle depletion, severe fat depletion (15.5% weight loss over the past 6 months,).  GOAL:   Patient will meet greater than or equal to 90% of their needs   MONITOR:   PO intake, Supplement acceptance, Skin, Labs, Weight trends, I & O's  REASON FOR ASSESSMENT:   Consult Wound healing  ASSESSMENT:   64 yr old Male with PMH significant for poorly controlled type 2 diabetes mellitus, ESRD on HD MWF, severe PAD, HLD, GERD, hypothyroidism, and cardiomyopathy. Patient underwent left foot ray amputation on 03/25/24 and Right TMA on 04/26/24. Who presents with bilateral foot gangrene and nonhealing wounds.   1/13: s/p Revision left foot transmetatarsal to lisfranc amputation  1/14: R BKA   HD yesterday with 1.3L UF. Undocumented UOP. Plan for HD tomorrow.   Noted pt agitated this morning and was removing lines and refusing meds. Was placed in mitts on assessment. Pt awake but poor historian, kept repeating his leg hurt and to remove his mitts. No family bedside to provide updated nutritional history. RN reports his mom comes in most days to  help feed him and he seems to do better when she is around. Was living with is mother PTA who was taking care of him per RN.  Breakfast at bedside untouched this morning.   Pt with fluctuating po intake, limited documentation available however in the last 5 meals documented pt has consumed 60% of his meals x 5 recorded meals. Per chart review pt eats better and takes his medications when mom is at bedside. Needs feeding assistance and had preferences on foods he will eat, will try to confirm with mother at a later date. Mostly refuses all supplements provided.   Noted he has experienced a 15.5% weight loss over the past 6 months, which is significant for time frame. Per nephrology notes, EDW is 63.8 kg down from 69 kg in October 2025, 7.5% weight loss in 3 months. New exam findings of severe muscle and fat wasting. Pt meets criteria for severe malnutrition. Discussed possibility of nutrition support with MD, will hold off on this time. MD discussed with pt's mother.   Labs resulted: Vitamin A , Vitamin D , and zinc  normal. Vitamin C  low will supplement.   Dispo: SNF  Admit weight: 74.6 kg - Fluid? Current weight: 66 kg   EDW: 63.8 kg  Average Meal Intake: 1/12-1/20: 60% intake x 5 recorded meals  Nutritionally Relevant Medications: Scheduled Meds:  (feeding supplement) PROSource Plus  30 mL Oral BID AC   feeding supplement  237 mL Oral BID BM   multivitamin  1 tablet Oral QHS   nutrition supplement (JUVEN)  1 packet Oral BID BM  senna-docusate  2 tablet Oral Q1200   thiamine   100 mg Oral Daily   Continuous Infusions:  sodium chloride  75 mL/hr at 06/25/24 0507   anticoagulant sodium citrate      Labs Reviewed: Sodium 133 Creatinine 3.88 Phosphorus 4.9 GFR 16 Vitamin A  31.3 Vitamin C  0.1 Zinc  66 CRP 6.2  CBG ranges from 70-105 mg/dL over the last 24 hours HgbA1c 4.6  NUTRITION - FOCUSED PHYSICAL EXAM:  Flowsheet Row Most Recent Value  Orbital Region Moderate depletion  Upper  Arm Region Severe depletion  Thoracic and Lumbar Region Severe depletion  Buccal Region Moderate depletion  Temple Region Mild depletion  Clavicle Bone Region Severe depletion  Clavicle and Acromion Bone Region Severe depletion  Scapular Bone Region Severe depletion  Dorsal Hand Unable to assess  [MItts]  Patellar Region Severe depletion  Anterior Thigh Region Severe depletion  Posterior Calf Region Severe depletion  Edema (RD Assessment) None  Hair Reviewed  Eyes Reviewed  Mouth Reviewed  Skin Reviewed  Nails Reviewed    Diet Order:   Diet Order             Diet regular Room service appropriate? No; Fluid consistency: Thin; Fluid restriction: 2000 mL Fluid  Diet effective now                   EDUCATION NEEDS:   Not appropriate for education at this time  Skin:  Skin Assessment: Reviewed RN Assessment (Closed surgical incision Bilateral feet)  Last BM:  PTA  Height:   Ht Readings from Last 1 Encounters:  06/26/24 5' 10 (1.778 m)    Weight:   Wt Readings from Last 1 Encounters:  07/01/24 60.2 kg    Ideal Body Weight:  70.6 kg (Account for R BKA)  BMI:  Body mass index is 19.04 kg/m.  Estimated Nutritional Needs:   Kcal:  1900-2100  Protein:  90-110 gm  Fluid:  >1.9L/day   Olivia Kenning, RD Registered Dietitian  See Amion for more information

## 2024-07-02 NOTE — Progress Notes (Addendum)
 " [facility] Progress Note   Subjective:    Seen and examined patient at bedside. Patient's Mother also at bedside. Tolerated yesterday's HD with net UF 1.3L. Patient's Mother informed me he refused his morning medications and tried pulling out his IV earlier today. He denies SOB, CP, and N/V. Next HD 1/21.  Objective Vitals:   07/01/24 2027 07/01/24 2230 07/02/24 0401 07/02/24 0740  BP: (!) 162/73 (!) 158/74 (!) 158/77 (!) 156/73  Pulse: 92 89 73 68  Resp:   20 16  Temp:   98.2 F (36.8 C) 98.2 F (36.8 C)  TempSrc:   Oral Oral  SpO2:    99%  Weight:      Height:       Physical Exam General: Awake, NAD Head: NCAT sclera not icteric  Lungs: unlabored, normal wob Heart: RRR Abdomen: soft and non-tender  Lower extremities: No LE edema; rt bka, L TMA Neuro: awake, flat affect Dialysis Access: R [facility]  Filed Weights   06/29/24 1245 07/01/24 0810 07/01/24 1205  Weight: 57.3 kg 62.1 kg 60.2 kg    Intake/Output Summary (Last 24 hours) at 07/02/2024 1345 Last data filed at 07/02/2024 0800 Gross per 24 hour  Intake 60 ml  Output --  Net 60 ml    Additional Objective Labs: Basic Metabolic Panel: Recent Labs  Lab 06/26/24 0848 06/26/24 0944 06/28/24 1421 07/01/24 0840 07/02/24 0406  NA 137   < > 132* 137 133*  K 4.5   < > 4.1 3.9 4.1  CL 97*   < > 95* 97* 92*  CO2 22  --  23 24 24   GLUCOSE 135*   < > 136* 78 93  BUN 39*   < > 39* 40* 18  CREATININE 5.38*   < > 5.08* 5.74* 3.88*  CALCIUM  8.9  --  9.1 8.9 9.0  PHOS 5.2*  --  3.3 4.9*  --    < > = values in this interval not displayed.   Liver Function Tests: Recent Labs  Lab 06/28/24 1421  ALBUMIN  2.9*   No results for input(s): LIPASE, AMYLASE in the last 168 hours. CBC: Recent Labs  Lab 06/26/24 0848 06/26/24 0944 07/01/24 0840 07/01/24 1445 07/02/24 0406  WBC 12.7*  --  12.1* 11.4* 11.0*  NEUTROABS  --   --   --   --  8.7*  HGB 10.4*   < > 11.0* 12.1* 12.9*  HCT 34.1*   < > 35.6*  39.8 41.8  MCV 93.2  --  93.9 93.0 93.1  PLT 315  --  205 211 204   < > = values in this interval not displayed.   Blood Culture    Component Value Date/Time   SDES TISSUE 06/25/2024 1020   SPECREQUEST LEFT FOOT TISSUE 06/25/2024 1020   CULT  06/25/2024 1020    ABUNDANT STAPHYLOCOCCUS AUREUS RARE PSEUDOMONAS FLUORESCENS CRITICAL RESULT CALLED TO, READ BACK BY AND VERIFIED WITH: RN ANGEL M [id number] FCP SUSCEPTIBILITIES PERFORMED ON PREVIOUS CULTURE WITHIN THE LAST 5 DAYS. STAPHYLOCOCCUS AUREUS    REPTSTATUS 06/30/2024 FINAL 06/25/2024 1020    Cardiac Enzymes: No results for input(s): CKTOTAL, CKMB, CKMBINDEX, TROPONINI in the last 168 hours. CBG: Recent Labs  Lab 06/26/24 1211 06/29/24 1729 07/01/24 1236 07/01/24 1259 07/01/24 1620  GLUCAP 133* 91 70 78 105*   Iron Studies: No results for input(s): IRON, TIBC, TRANSFERRIN, FERRITIN in the last 72 hours. Lab Results  Component Value Date   INR 1.2  05/13/2024   INR 1.1 05/19/2023   INR 1.1 03/06/2023   Studies/Results: No results found.  Medications:  sodium chloride  75 mL/hr at 06/25/24 0507   anticoagulant sodium citrate       (feeding supplement) PROSource Plus  30 mL Oral BID AC   amitriptyline   10 mg Oral QHS   amLODipine   10 mg Oral q morning   apixaban   2.5 mg Oral BID   atorvastatin   40 mg Oral Daily   calcium  acetate  667 mg Oral TID WC   Chlorhexidine  Gluconate Cloth  6 each Topical Q0600   doxycycline   100 mg Oral Q12H   feeding supplement  237 mL Oral BID BM   levothyroxine   50 mcg Oral QAC breakfast   metoprolol  tartrate  50 mg Oral BID   multivitamin  1 tablet Oral QHS   nutrition supplement (JUVEN)  1 packet Oral BID BM   pantoprazole   40 mg Oral Daily   polyethylene glycol  17 g Oral Daily   senna-docusate  2 tablet Oral Q1200   thiamine   100 mg Oral Daily    Dialysis Orders: MWF - [facility] 4hrs, BFR 400, DFR Auto 1.5,  EDW 63.8kg, 3K/ 2.5Ca Mircera 225  mcg q2wks - last 06/21/24  Assessment/Plan: Gangrene left/right foot, s/p b/l TMA - Podiatry and VVS following. S/p left-sided TMA revision 1/13, s/p rt bka 1/14. [facility] following ESRD - on HD MWF sched, next HD 1/21 Hypertension/volume  - Bps seem to go down with HD. Monitor trend for now. Continue Amlodipine  10mg  daily and Metoprolol  50mg  BID. UF as tolerated. Anemia of CKD - ESA given 1/9 in outpatient, not due yet, hgb 11.9-on target Secondary Hyperparathyroidism -  po4 acceptable Nutrition - Renal diet with fluid restriction  Charmaine Piety, NP [facility] 07/02/2024,1:45 PM  LOS: 9 days    "

## 2024-07-02 NOTE — Progress Notes (Signed)
 CSW attempted to engage pt in conversation about SDOH: social connections.  Limited response from pt, who has been disoriented several days prior to today.  Pt asking when his mother will arrive at the hospital.  Local opportunities for social activities placed on pt AVS. Cathlyn Ferry, MSW, LCSW 1/20/202611:25 AM

## 2024-07-02 NOTE — Telephone Encounter (Signed)
 Pharmacy Patient Advocate Encounter  Insurance verification completed.    The patient is insured through [employer]/ [employer]. Patient has Medicare and is not eligible for a copay card, but may be able to apply for patient assistance or Medicare RX Payment Plan (Patient Must reach out to their plan, if eligible for payment plan), if available.    Ran test claim for Eliquis  5mg  tablet and the current 30 day co-pay is $12.65.   This test claim was processed through [facility]- copay amounts may vary at other pharmacies due to pharmacy/plan contracts, or as the patient moves through the different stages of their insurance plan.

## 2024-07-02 NOTE — Progress Notes (Signed)
 " PROGRESS NOTE  [name] [name] FMW:[id number] DOB: 1960/10/24 DOA: 06/23/2024 PCP: Kip Righter, MD   LOS: 9 days   Brief narrative:  [name] [name] is a 64 y o male with poorly controlled type 2 diabetes mellitus, ESRD on hemodialysis (M/W/F schedule), severe peripheral arterial disease, hyperlipidemia, hypothyroidism, and cardiomyopathy who presented to podiatry clinic with bilateral foot gangrene and nonhealing wounds.  Patient had undergone left foot ray amputation on 03/25/24 and Right TMA on 04/26/24.  Despite these intervention and multiple prior revascularization procedures, patient presented with dry gangrene/necrotic eschar at the right TMA site and nonhealing wounds at the left TMA site.  Patient has had extensive vascular history including multiple lower extremity angiograms with interventions (01/30/2024, 03/20/2024, 04/16/2024) including peripheral atherectomy, intravascular lithotripsy, and angioplasty. He has bilateral AV grafts for dialysis access (right arm 08/17/2023, left arm 12/27/2022) and currently has a right internal jugular dialysis catheter.  Vascular surgery and podiatry were consulted. 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      Assessment/Plan: Principal Problem:   Gangrene of foot (HCC)  Dry gangrene / Necrotic eschar Right TMA  Left foot nonhealing post TMA : Bilateral Osteomyelitis: Severe PAD / Limb ischemia: Continue vancomycin , cefepime  and Flagyl . MRI of the right foot highly suspicious for bone infarcts and osteomyelitis.  Diffuse cellulitis and myofasciitis.  MRI of the left foot showed open wounds,along mid metatarsal surgical site, soft tissue inflammation consistent with tissue necrosis,  nonviable tissue.  Osteomyelitis.  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  Plan is nonweightbearing of bilateral  lower extremity.  Follow-up outpatient in 4 to 6 weeks for staple removal.   End-stage renal disease on hemodialysis [facility] Nephrology has been consulted for hemodialysis.  Continue dialysis as per nephrology.   Type 2 diabetes melitis Continue sliding scale insulin .  Continue to monitor.   Chronic combined systolic and diastolic CHF: Appears euvolemic.  Chest x-ray showed cardiomegaly.  Continue volume management with hemodialysis.   Hypothyroidism: Continue Synthroid    Essential hypertension: Continue amlodipine  and metoprolol    GERD: Continue Protonix .  Debility deconditioning. PT recommends skilled nursing facility placement  Poor oral intake.  Nutritionist and nursing as per concern about refusing medications and food.  He however does better when his mother is around.  Communicated with TOC about it.  Not quite sure if feeding tube would be a good idea especially when he is agitated and is likely to pull.  Will need to continue to encourage oral intake.  Spoke with the patient's mom about it.  DVT prophylaxis: apixaban  (ELIQUIS ) tablet 2.5 mg Start: 07/01/24 1445 apixaban  (ELIQUIS ) tablet 2.5 mg   Disposition: Skilled nursing facility as per PT evaluation  Status is: Inpatient Remains inpatient appropriate because: Need for skilled nursing facility placement.    Code Status:     Code Status: Full Code  Family Communication: Spoke with the patient's mother at bedside on 07/01/2024 and 07/02/24  Consultants: Nephrology Orthopedics  Procedures: Hemodialysis Right BKA by vascular surgery on 06/26/2024.   Anti-infectives:  Vancomycin  IV on Monday Wednesday Friday  Anti-infectives (From admission, onward)    Start     Dose/Rate Route Frequency Ordered Stop   06/28/24 1200  vancomycin  (VANCOREADY) IVPB 750 mg/150 mL  Status:  Discontinued        750 mg 150 mL/hr over 60 Minutes Intravenous Every M-W-F (Hemodialysis)  06/26/24 1356 06/26/24 1410   06/28/24 1000   doxycycline  (VIBRA -TABS) tablet 100 mg        100 mg Oral Every 12 hours 06/28/24 0852 07/03/24 0959   06/26/24 1430  vancomycin  (VANCOREADY) IVPB 750 mg/150 mL  Status:  Discontinued        750 mg 150 mL/hr over 60 Minutes Intravenous Every M-W-F (Hemodialysis) 06/26/24 1410 06/28/24 0852   06/24/24 1200  vancomycin  (VANCOREADY) IVPB 750 mg/150 mL  Status:  Discontinued        750 mg 150 mL/hr over 60 Minutes Intravenous Every M-W-F (Hemodialysis) 06/23/24 2022 06/26/24 1356   06/23/24 2030  metroNIDAZOLE  (FLAGYL ) IVPB 500 mg  Status:  Discontinued        500 mg 100 mL/hr over 60 Minutes Intravenous Every 12 hours 06/23/24 2018 06/28/24 0852   06/23/24 2030  ceFEPIme  (MAXIPIME ) 1 g in sodium chloride  0.9 % 100 mL IVPB  Status:  Discontinued        1 g 200 mL/hr over 30 Minutes Intravenous Every 24 hours 06/23/24 2021 06/28/24 0852   06/23/24 2030  vancomycin  (VANCOREADY) IVPB 1500 mg/300 mL        1,500 mg 150 mL/hr over 120 Minutes Intravenous  Once 06/23/24 2022 06/24/24 0152       Subjective:  Today, patient was seen and examined at bedside.  Nursing staff reported reported refusal to food and medications.  Eats better when mother is around. Mother says he needs assistance and has preference on foods. Feels sleepy especially after dialysis.  More alert awake and refusing to be bothered.  Says go away.  Speech is clear.  Objective: Vitals:   07/02/24 0401 07/02/24 0740  BP: (!) 158/77 (!) 156/73  Pulse: 73 68  Resp: 20 16  Temp: 98.2 F (36.8 C) 98.2 F (36.8 C)  SpO2:  99%    Intake/Output Summary (Last 24 hours) at 07/02/2024 1135 Last data filed at 07/02/2024 0800 Gross per 24 hour  Intake 180 ml  Output 1300 ml  Net -1120 ml   Filed Weights   06/29/24 1245 07/01/24 0810 07/01/24 1205  Weight: 57.3 kg 62.1 kg 60.2 kg   Body mass index is 19.04 kg/m.   Physical Exam:  GENERAL: Patient is alert awake and Communicative, , appears weak and deconditioned.  Not in  obvious distress. HENT: No scleral pallor or icterus. Pupils equally reactive to light. Oral mucosa is moist NECK: is supple, no gross swelling noted.  Right internal jugular hemodialysis catheter in place CHEST: Clear to auscultation. No crackles or wheezes.  Diminished breath sounds bilaterally. CVS: S1 and S2 heard, no murmur. Regular rate and rhythm.  ABDOMEN: Soft, non-tender, bowel sounds are present. EXTREMITIES: Right above-knee amputation with bandage, left TMA.  Bilateral upper extremity mittens in place. CNS: Cranial nerves are intact. SKIN: warm and dry   Data Review: I have personally reviewed the following laboratory data and studies,  CBC: Recent Labs  Lab 06/26/24 0848 06/26/24 0944 07/01/24 0840 07/01/24 1445 07/02/24 0406  WBC 12.7*  --  12.1* 11.4* 11.0*  NEUTROABS  --   --   --   --  8.7*  HGB 10.4* 11.9* 11.0* 12.1* 12.9*  HCT 34.1* 35.0* 35.6* 39.8 41.8  MCV 93.2  --  93.9 93.0 93.1  PLT 315  --  205 211 204   Basic Metabolic Panel: Recent Labs  Lab 06/26/24 0848 06/26/24 0944 06/28/24 1421 07/01/24 0840 07/02/24 0406  NA 137 134* 132* 137  133*  K 4.5 4.4 4.1 3.9 4.1  CL 97* 102 95* 97* 92*  CO2 22  --  23 24 24   GLUCOSE 135* 114* 136* 78 93  BUN 39* 41* 39* 40* 18  CREATININE 5.38* 5.70* 5.08* 5.74* 3.88*  CALCIUM  8.9  --  9.1 8.9 9.0  MG  --   --   --  2.4  --   PHOS 5.2*  --  3.3 4.9*  --    Liver Function Tests: Recent Labs  Lab 06/28/24 1421  ALBUMIN  2.9*   No results for input(s): LIPASE, AMYLASE in the last 168 hours. No results for input(s): AMMONIA in the last 168 hours. Cardiac Enzymes: No results for input(s): CKTOTAL, CKMB, CKMBINDEX, TROPONINI in the last 168 hours. BNP (last 3 results) No results for input(s): BNP in the last 8760 hours.  ProBNP (last 3 results) No results for input(s): PROBNP in the last 8760 hours.  CBG: Recent Labs  Lab 06/26/24 1211 06/29/24 1729 07/01/24 1236 07/01/24 1259  07/01/24 1620  GLUCAP 133* 91 70 78 105*   Recent Results (from the past 240 hours)  Blood culture (routine x 2)     Status: None   Collection Time: 06/23/24  4:48 PM   Specimen: BLOOD  Result Value Ref Range Status   Specimen Description BLOOD SITE NOT SPECIFIED  Final   Special Requests   Final    BOTTLES DRAWN AEROBIC AND ANAEROBIC Blood Culture results may not be optimal due to an inadequate volume of blood received in culture bottles   Culture   Final    NO GROWTH 5 DAYS Performed at [facility], [address], [place], [place] [address]    Report Status 06/28/2024 FINAL  Final  Blood culture (routine x 2)     Status: None   Collection Time: 06/23/24  4:53 PM   Specimen: BLOOD  Result Value Ref Range Status   Specimen Description BLOOD SITE NOT SPECIFIED  Final   Special Requests   Final    BOTTLES DRAWN AEROBIC AND ANAEROBIC Blood Culture results may not be optimal due to an inadequate volume of blood received in culture bottles   Culture   Final    NO GROWTH 5 DAYS Performed at [facility], [address], [place], [place] [address]    Report Status 06/28/2024 FINAL  Final  Surgical pcr screen     Status: Abnormal   Collection Time: 06/25/24  2:26 AM   Specimen: Nasal Mucosa; Nasal Swab  Result Value Ref Range Status   MRSA, PCR POSITIVE (A) NEGATIVE Final   Staphylococcus aureus POSITIVE (A) NEGATIVE Final    Comment: RESULT CALLED TO, READ BACK BY AND VERIFIED WITH: A LYNPHACUM RN 06/25/2024 @ 0414 BY AB (NOTE) The Xpert SA Assay (FDA approved for NASAL specimens in patients 84 years of age and older), is one component of a comprehensive surveillance program. It is not intended to diagnose infection nor to guide or monitor treatment. Performed at [facility], [address], [place], [place] [address]   Aerobic/Anaerobic Culture w Gram Stain (surgical/deep wound)     Status: None   Collection Time: 06/25/24 10:19 AM   Specimen: PATH Soft  tissue  Result Value Ref Range Status   Specimen Description TISSUE  Final   Special Requests LEFT FOOT ABSCESS  Final   Gram Stain NO WBC SEEN RARE GRAM POSITIVE COCCI   Final   Culture  Final    MODERATE METHICILLIN RESISTANT STAPHYLOCOCCUS AUREUS NO ANAEROBES ISOLATED Performed at [facility], [address], [place], [place] [address]    Report Status 06/30/2024 FINAL  Final   Organism ID, Bacteria METHICILLIN RESISTANT STAPHYLOCOCCUS AUREUS  Final      Susceptibility   Methicillin resistant staphylococcus aureus - MIC*    CIPROFLOXACIN >=8 RESISTANT Resistant     ERYTHROMYCIN >=8 RESISTANT Resistant     GENTAMICIN <=0.5 SENSITIVE Sensitive     OXACILLIN >=4 RESISTANT Resistant     TETRACYCLINE <=1 SENSITIVE Sensitive     VANCOMYCIN  1 SENSITIVE Sensitive     TRIMETH/SULFA >=320 RESISTANT Resistant     CLINDAMYCIN <=0.25 SENSITIVE Sensitive     RIFAMPIN <=0.5 SENSITIVE Sensitive     Inducible Clindamycin NEGATIVE Sensitive     LINEZOLID 2 SENSITIVE Sensitive     * MODERATE METHICILLIN RESISTANT STAPHYLOCOCCUS AUREUS  Aerobic/Anaerobic Culture w Gram Stain (surgical/deep wound)     Status: None   Collection Time: 06/25/24 10:20 AM   Specimen: PATH Soft tissue  Result Value Ref Range Status   Specimen Description TISSUE  Final   Special Requests LEFT FOOT TISSUE  Final   Gram Stain   Final    NO WBC SEEN ABUNDANT GRAM POSITIVE COCCI Performed at [facility], [address], [place], [place] [address]    Culture   Final    ABUNDANT STAPHYLOCOCCUS AUREUS RARE PSEUDOMONAS FLUORESCENS CRITICAL RESULT CALLED TO, READ BACK BY AND VERIFIED WITH: RN ANGEL M [phone] [facility] SUSCEPTIBILITIES PERFORMED ON PREVIOUS CULTURE WITHIN THE LAST 5 DAYS. STAPHYLOCOCCUS AUREUS    Report Status 06/30/2024 FINAL  Final   Organism ID, Bacteria PSEUDOMONAS FLUORESCENS  Final      Susceptibility   Pseudomonas fluorescens - MIC*    MEROPENEM <=0.25 SENSITIVE Sensitive      CIPROFLOXACIN <=0.06 SENSITIVE Sensitive     PIP/TAZO Value in next row Sensitive      8 SENSITIVEThis is a modified FDA-approved test that has been validated and its performance characteristics determined by the reporting laboratory.  This laboratory is certified under the Clinical Laboratory Improvement Amendments CLIA as qualified to perform high complexity clinical laboratory testing.    CEFEPIME  Value in next row Sensitive      8 SENSITIVEThis is a modified FDA-approved test that has been validated and its performance characteristics determined by the reporting laboratory.  This laboratory is certified under the Clinical Laboratory Improvement Amendments CLIA as qualified to perform high complexity clinical laboratory testing.    * RARE PSEUDOMONAS FLUORESCENS     Studies: No results found.    Trevaun Rendleman, MD  [facility] 07/02/2024  If 7PM-7AM, please contact night-coverage             "

## 2024-07-02 NOTE — Discharge Instructions (Signed)

## 2024-07-03 ENCOUNTER — Ambulatory Visit (HOSPITAL_COMMUNITY)

## 2024-07-03 ENCOUNTER — Ambulatory Visit

## 2024-07-03 DIAGNOSIS — I96 Gangrene, not elsewhere classified: Secondary | ICD-10-CM | POA: Diagnosis not present

## 2024-07-03 LAB — CBC
HCT: 35.9 % — ABNORMAL LOW (ref 39.0–52.0)
Hemoglobin: 11.3 g/dL — ABNORMAL LOW (ref 13.0–17.0)
MCH: 29 pg (ref 26.0–34.0)
MCHC: 31.5 g/dL (ref 30.0–36.0)
MCV: 92.1 fL (ref 80.0–100.0)
Platelets: 230 K/uL (ref 150–400)
RBC: 3.9 MIL/uL — ABNORMAL LOW (ref 4.22–5.81)
RDW: 15.8 % — ABNORMAL HIGH (ref 11.5–15.5)
WBC: 9.5 K/uL (ref 4.0–10.5)
nRBC: 0 % (ref 0.0–0.2)

## 2024-07-03 MED ORDER — PENTAFLUOROPROP-TETRAFLUOROETH EX AERO: 1.0000 | INHALATION_SPRAY | CUTANEOUS | Status: DC | PRN

## 2024-07-03 MED ORDER — LIDOCAINE-PRILOCAINE 2.5-2.5 % EX CREA: 1.0000 | TOPICAL_CREAM | CUTANEOUS | Status: DC | PRN

## 2024-07-03 MED ORDER — LIDOCAINE HCL (PF) 1 % IJ SOLN: 5.0000 mL | INTRAMUSCULAR | Status: DC | PRN

## 2024-07-03 MED ORDER — ALTEPLASE 2 MG IJ SOLR: 2.0000 mg | Freq: Once | INTRAMUSCULAR | Status: DC | PRN

## 2024-07-03 MED ORDER — ORAL CARE MOUTH RINSE: 15.0000 mL | OROMUCOSAL | Status: DC | PRN

## 2024-07-03 MED ORDER — HEPARIN SODIUM (PORCINE) 1000 UNIT/ML DIALYSIS: 1000.0000 [IU] | INTRAMUSCULAR | Status: DC | PRN

## 2024-07-03 NOTE — TOC Progression Note (Signed)
 Transition of Care [facility]) - Progression Note    Patient Details  Name: [name] [name] MRN: [id number] Date of Birth: 1961/01/16  Transition of [facility]) CM/SW Contact  Bridget Cordella Simmonds, LCSW Phone Number: 07/03/2024, 2:20 PM  Clinical Narrative:   Per MD, dilirium improved, second SNF auth request made with [facility].     Expected Discharge Plan: Skilled Nursing Facility Barriers to Discharge: Continued Medical Work up, SNF Pending bed offer               Expected Discharge Plan and Services In-house Referral: Clinical Social Work   Post Acute Care Choice: Skilled Nursing Facility Living arrangements for the past 2 months: Single Family Home                                       Social Drivers of Health (SDOH) Interventions SDOH Screenings   Food Insecurity: No Food Insecurity (06/24/2024)  Housing: Low Risk (06/24/2024)  Transportation Needs: No Transportation Needs (06/24/2024)  Utilities: Not At Risk (06/24/2024)  Social Connections: Socially Isolated (06/24/2024)  Tobacco Use: Low Risk (06/26/2024)    Readmission Risk Interventions    03/28/2024    1:14 PM 03/29/2023    1:45 PM 03/07/2023    1:31 PM  Readmission Risk Prevention Plan  Transportation Screening Complete Complete Complete  HRI or Home Care Consult   Complete  Palliative Care Screening   Not Applicable  Medication Review (RN Care Manager) Complete Complete Complete  PCP or Specialist appointment within 3-5 days of discharge  Complete   HRI or Home Care Consult Complete Complete   SW Recovery Care/Counseling Consult Patient refused Complete   Palliative Care Screening Complete Complete   Skilled Nursing Facility Patient Refused Complete

## 2024-07-03 NOTE — Progress Notes (Signed)
 Received patient in bed to unit.  Alert and oriented.  Informed consent signed and in chart.   TX duration:3:27  Patient tolerated well.  Transported back to the room  Alert, without acute distress.  Hand-off given to patient's nurse.   Access used: Catheter Access issues: NA  Total UF removed: 1L Post HD weight: 61.6KG   07/03/24 1909  Vitals  Temp 98.2 F (36.8 C)  Temp Source Axillary  BP (!) 145/86  MAP (mmHg) 99  BP Location Left Arm  BP Method Automatic  Pulse Rate (!) 101  Pulse Rate Source Monitor  Weight 61.7 kg  Type of Weight Post-Dialysis  Oxygen Therapy  SpO2 96 %  O2 Device Room Air  During Treatment Monitoring  Blood Flow Rate (mL/min) 199 mL/min  Arterial Pressure (mmHg) -95.14 mmHg  Venous Pressure (mmHg) 71.71 mmHg  TMP (mmHg) -6.46 mmHg  Ultrafiltration Rate (mL/min) 616 mL/min  Dialysate Flow Rate (mL/min) 299 ml/min  Dialysate Potassium Concentration 3  Dialysate Calcium  Concentration 2.5  Duration of HD Treatment -hour(s) 3.44 hour(s)  Cumulative Fluid Removed (mL) per Treatment  965.45  HD Safety Checks Performed Yes  Intra-Hemodialysis Comments Tx completed  Post Treatment  Dialyzer Clearance Lightly streaked  Hemodialysis Intake (mL) 0 mL  Liters Processed 82.6  Fluid Removed (mL) 1000 mL  Tolerated HD Treatment Yes  Hemodialysis Catheter Right Internal jugular Double lumen Permanent (Tunneled)  Placement Date/Time: 01/10/24 1548   Placed prior to admission: No  Serial / Lot #: [id number]  Expiration Date: 08/10/28  Time Out: Correct patient;Correct site;Correct procedure  Maximum sterile barrier precautions: Hand hygiene;Large sterile sheet;S...  Site Condition No complications  Blue Lumen Status Antimicrobial dead end cap  Red Lumen Status Antimicrobial dead end cap  Catheter fill solution Heparin  1000 units/ml  Catheter fill volume (Arterial) 1.6 cc  Catheter fill volume (Venous) 1.6  Dressing Type Transparent  Dressing Status  Antimicrobial disc/dressing in place  Dressing Change Due 07/06/24  Post treatment catheter status Capped and Clamped      [facility]

## 2024-07-03 NOTE — Progress Notes (Addendum)
 " PROGRESS NOTE  STEEN BISIG FMW:[id number] DOB: [birth year] DOA: 06/23/2024 PCP: Kip Righter, MD   LOS: 10 days   Brief narrative:  [name] [name] is a 64 y o male with poorly controlled type 2 diabetes mellitus, ESRD on hemodialysis (M/W/F schedule), severe peripheral arterial disease, hyperlipidemia, hypothyroidism, and cardiomyopathy who presented to podiatry clinic with bilateral foot gangrene and nonhealing wounds.  Patient had undergone left foot ray amputation on 03/25/24 and Right TMA on 04/26/24.  Despite these intervention and multiple prior revascularization procedures, patient presented with dry gangrene/necrotic eschar at the right TMA site and nonhealing wounds at the left TMA site.  Patient has had extensive vascular history including multiple lower extremity angiograms with interventions (01/30/2024, 03/20/2024, 04/16/2024) including peripheral atherectomy, intravascular lithotripsy, and angioplasty. He has bilateral AV grafts for dialysis access (right arm 08/17/2023, left arm 12/27/2022) and currently has a right internal jugular dialysis catheter.  Vascular surgery and podiatry were consulted. 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      Assessment/Plan: Principal Problem:   Gangrene of foot (HCC)  Dry gangrene / Necrotic eschar Right TMA  Left foot nonhealing post TMA : Bilateral Osteomyelitis: Severe PAD / Limb ischemia: Continue vancomycin , cefepime  and Flagyl . MRI of the right foot highly suspicious for bone infarcts and osteomyelitis.  Diffuse cellulitis and myofasciitis.  MRI of the left foot showed open wounds,along mid metatarsal surgical site, soft tissue inflammation consistent with tissue necrosis,  nonviable tissue.  Osteomyelitis.  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  Plan is nonweightbearing of  bilateral lower extremity.  Follow-up outpatient in 4 to 6 weeks for staple removal.   End-stage renal disease on hemodialysis MWF Nephrology on board..  Continue dialysis as per nephrology.   Type 2 diabetes melitis Continue sliding scale insulin .  Continue to monitor.  Patient has been refusing medications and food   Chronic combined systolic and diastolic CHF: Appears euvolemic.  Chest x-ray showed cardiomegaly.  Continue volume management with hemodialysis.   Hypothyroidism: Continue Synthroid    Essential hypertension: Continue amlodipine  and metoprolol , has been noncompliant with medication.   GERD: Continue Protonix .  Debility deconditioning. PT recommends skilled nursing facility placement  Poor oral intake.  Spoke about the importance of eating food and nutrition.  Encouraged oral nutrition.  Patient is likely be a good candidate for PEG tube placement.  With the patient's mother about it.  He has some food preferences and does better when mom is around.  Will also get goals of care discussion with palliative care.  DVT prophylaxis: apixaban  (ELIQUIS ) tablet 2.5 mg Start: 07/01/24 1445 apixaban  (ELIQUIS ) tablet 2.5 mg   Disposition: Skilled nursing facility as per PT evaluation  Status is: Inpatient Remains inpatient appropriate because: Need for skilled nursing facility placement.    Code Status:     Code Status: Full Code  Family Communication: Spoke with the patient's mother on 07/02/24  Consultants: Nephrology Orthopedics  Procedures: Hemodialysis Right BKA by vascular surgery on 06/26/2024.   Anti-infectives:  Vancomycin  IV on Monday Wednesday Friday  Anti-infectives (From admission, onward)    Start     Dose/Rate Route Frequency Ordered Stop   06/28/24 1200  vancomycin  (VANCOREADY) IVPB 750 mg/150 mL  Status:  Discontinued        750 mg 150 mL/hr over 60 Minutes Intravenous Every M-W-F (Hemodialysis) 06/26/24 1356 06/26/24 1410  06/28/24 1000   doxycycline  (VIBRA -TABS) tablet 100 mg        100 mg Oral Every 12 hours 06/28/24 0852 07/02/24 2258   06/26/24 1430  vancomycin  (VANCOREADY) IVPB 750 mg/150 mL  Status:  Discontinued        750 mg 150 mL/hr over 60 Minutes Intravenous Every M-W-F (Hemodialysis) 06/26/24 1410 06/28/24 0852   06/24/24 1200  vancomycin  (VANCOREADY) IVPB 750 mg/150 mL  Status:  Discontinued        750 mg 150 mL/hr over 60 Minutes Intravenous Every M-W-F (Hemodialysis) 06/23/24 2022 06/26/24 1356   06/23/24 2030  metroNIDAZOLE  (FLAGYL ) IVPB 500 mg  Status:  Discontinued        500 mg 100 mL/hr over 60 Minutes Intravenous Every 12 hours 06/23/24 2018 06/28/24 0852   06/23/24 2030  ceFEPIme  (MAXIPIME ) 1 g in sodium chloride  0.9 % 100 mL IVPB  Status:  Discontinued        1 g 200 mL/hr over 30 Minutes Intravenous Every 24 hours 06/23/24 2021 06/28/24 0852   06/23/24 2030  vancomycin  (VANCOREADY) IVPB 1500 mg/300 mL        1,500 mg 150 mL/hr over 120 Minutes Intravenous  Once 06/23/24 2022 06/24/24 0152       Subjective:  Today, patient was seen and examined at bedside.  Patient appears to be more alert awake and communicative.  Denies nausea vomiting fever chills or rigor.  Denies overt pain.  Discussed about the importance of eating better.   Objective: Vitals:   07/03/24 0332 07/03/24 0744  BP: (!) 140/68 (!) 150/70  Pulse: 67 73  Resp: 19 16  Temp: 98.6 F (37 C) 98.9 F (37.2 C)  SpO2: 99% 95%    Intake/Output Summary (Last 24 hours) at 07/03/2024 1012 Last data filed at 07/02/2024 2032 Gross per 24 hour  Intake 250 ml  Output --  Net 250 ml   Filed Weights   06/29/24 1245 07/01/24 0810 07/01/24 1205  Weight: 57.3 kg 62.1 kg 60.2 kg   Body mass index is 19.04 kg/m.   Physical Exam:  General:  Average built, not in obvious distress, appears weak and deconditioned, HENT:   No scleral pallor or icterus noted. Oral mucosa is moist.  Right internal jugular hemodialysis catheter in  place Chest:  Clear breath sounds.  Diminished breath sounds bilaterally.  CVS: S1 &S2 heard. No murmur.  Regular rate and rhythm. Abdomen: Soft, nontender, nondistended.  Bowel sounds are heard.   Extremities: Right below-knee amputation.  Left amputation of the foot. Psych: Alert, awake and Communicative. CNS:  No cranial nerve deficits.  Moves upper extremities Skin: Warm and dry.  No rashes noted.  Data Review: I have personally reviewed the following laboratory data and studies,  CBC: Recent Labs  Lab 07/01/24 0840 07/01/24 1445 07/02/24 0406  WBC 12.1* 11.4* 11.0*  NEUTROABS  --   --  8.7*  HGB 11.0* 12.1* 12.9*  HCT 35.6* 39.8 41.8  MCV 93.9 93.0 93.1  PLT 205 211 204   Basic Metabolic Panel: Recent Labs  Lab 06/28/24 1421 07/01/24 0840 07/02/24 0406  NA 132* 137 133*  K 4.1 3.9 4.1  CL 95* 97* 92*  CO2 23 24 24   GLUCOSE 136* 78 93  BUN 39* 40* 18  CREATININE 5.08* 5.74* 3.88*  CALCIUM  9.1 8.9 9.0  MG  --  2.4  --   PHOS 3.3 4.9*  --    Liver Function Tests: Recent Labs  Lab 06/28/24  1421  ALBUMIN  2.9*   No results for input(s): LIPASE, AMYLASE in the last 168 hours. No results for input(s): AMMONIA in the last 168 hours. Cardiac Enzymes: No results for input(s): CKTOTAL, CKMB, CKMBINDEX, TROPONINI in the last 168 hours. BNP (last 3 results) No results for input(s): BNP in the last 8760 hours.  ProBNP (last 3 results) No results for input(s): PROBNP in the last 8760 hours.  CBG: Recent Labs  Lab 06/26/24 1211 06/29/24 1729 07/01/24 1236 07/01/24 1259 07/01/24 1620  GLUCAP 133* 91 70 78 105*   Recent Results (from the past 240 hours)  Blood culture (routine x 2)     Status: None   Collection Time: 06/23/24  4:48 PM   Specimen: BLOOD  Result Value Ref Range Status   Specimen Description BLOOD SITE NOT SPECIFIED  Final   Special Requests   Final    BOTTLES DRAWN AEROBIC AND ANAEROBIC Blood Culture results may not be  optimal due to an inadequate volume of blood received in culture bottles   Culture   Final    NO GROWTH 5 DAYS Performed at [facility], [address] [place], [place] [address]    Report Status 06/28/2024 FINAL  Final  Blood culture (routine x 2)     Status: None   Collection Time: 06/23/24  4:53 PM   Specimen: BLOOD  Result Value Ref Range Status   Specimen Description BLOOD SITE NOT SPECIFIED  Final   Special Requests   Final    BOTTLES DRAWN AEROBIC AND ANAEROBIC Blood Culture results may not be optimal due to an inadequate volume of blood received in culture bottles   Culture   Final    NO GROWTH 5 DAYS Performed at [facility], [address], [place], [place] [address]    Report Status 06/28/2024 FINAL  Final  Surgical pcr screen     Status: Abnormal   Collection Time: 06/25/24  2:26 AM   Specimen: Nasal Mucosa; Nasal Swab  Result Value Ref Range Status   MRSA, PCR POSITIVE (A) NEGATIVE Final   Staphylococcus aureus POSITIVE (A) NEGATIVE Final    Comment: RESULT CALLED TO, READ BACK BY AND VERIFIED WITH: A LYNPHACUM RN 06/25/2024 @ 0414 BY AB (NOTE) The Xpert SA Assay (FDA approved for NASAL specimens in patients 63 years of age and older), is one component of a comprehensive surveillance program. It is not intended to diagnose infection nor to guide or monitor treatment. Performed at [facility], [address], [place], [place] [address]   Aerobic/Anaerobic Culture w Gram Stain (surgical/deep wound)     Status: None   Collection Time: 06/25/24 10:19 AM   Specimen: PATH Soft tissue  Result Value Ref Range Status   Specimen Description TISSUE  Final   Special Requests LEFT FOOT ABSCESS  Final   Gram Stain NO WBC SEEN RARE GRAM POSITIVE COCCI   Final   Culture   Final    MODERATE METHICILLIN RESISTANT STAPHYLOCOCCUS AUREUS NO ANAEROBES ISOLATED Performed at [facility], [address], [place], [place] [address]    Report Status  06/30/2024 FINAL  Final   Organism ID, Bacteria METHICILLIN RESISTANT STAPHYLOCOCCUS AUREUS  Final      Susceptibility   Methicillin resistant staphylococcus aureus - MIC*    CIPROFLOXACIN >=8 RESISTANT Resistant     ERYTHROMYCIN >=8 RESISTANT Resistant     GENTAMICIN <=0.5 SENSITIVE Sensitive     OXACILLIN >=4 RESISTANT Resistant  TETRACYCLINE <=1 SENSITIVE Sensitive     VANCOMYCIN  1 SENSITIVE Sensitive     TRIMETH/SULFA >=320 RESISTANT Resistant     CLINDAMYCIN <=0.25 SENSITIVE Sensitive     RIFAMPIN <=0.5 SENSITIVE Sensitive     Inducible Clindamycin NEGATIVE Sensitive     LINEZOLID 2 SENSITIVE Sensitive     * MODERATE METHICILLIN RESISTANT STAPHYLOCOCCUS AUREUS  Aerobic/Anaerobic Culture w Gram Stain (surgical/deep wound)     Status: None   Collection Time: 06/25/24 10:20 AM   Specimen: PATH Soft tissue  Result Value Ref Range Status   Specimen Description TISSUE  Final   Special Requests LEFT FOOT TISSUE  Final   Gram Stain   Final    NO WBC SEEN ABUNDANT GRAM POSITIVE COCCI Performed at [facility], [address], [place], [place] [address]    Culture   Final    ABUNDANT STAPHYLOCOCCUS AUREUS RARE PSEUDOMONAS FLUORESCENS CRITICAL RESULT CALLED TO, READ BACK BY AND VERIFIED WITH: RN ANGEL M [phone] [facility] SUSCEPTIBILITIES PERFORMED ON PREVIOUS CULTURE WITHIN THE LAST 5 DAYS. STAPHYLOCOCCUS AUREUS    Report Status 06/30/2024 FINAL  Final   Organism ID, Bacteria PSEUDOMONAS FLUORESCENS  Final      Susceptibility   Pseudomonas fluorescens - MIC*    MEROPENEM <=0.25 SENSITIVE Sensitive     CIPROFLOXACIN <=0.06 SENSITIVE Sensitive     PIP/TAZO Value in next row Sensitive      8 SENSITIVEThis is a modified FDA-approved test that has been validated and its performance characteristics determined by the reporting laboratory.  This laboratory is certified under the Clinical Laboratory Improvement Amendments CLIA as qualified to perform high complexity clinical  laboratory testing.    CEFEPIME  Value in next row Sensitive      8 SENSITIVEThis is a modified FDA-approved test that has been validated and its performance characteristics determined by the reporting laboratory.  This laboratory is certified under the Clinical Laboratory Improvement Amendments CLIA as qualified to perform high complexity clinical laboratory testing.    * RARE PSEUDOMONAS FLUORESCENS     Studies: No results found.    Sigifredo Pignato, MD  [facility] 07/03/2024  If 7PM-7AM, please contact night-coverage             "

## 2024-07-03 NOTE — Plan of Care (Signed)
 Pt is alert, oriented x 3, afebrile, stable hemodynamically, no acute distress noted overnight. Pain is well tolerated. Pt is able to rest and sleep well with no major complaints. His incision sites on both legs dressing are dry and clean. No drainage. Plan of care is reviewed. Pt has been progressing. We will continue monitor.   Problem: Skin Integrity: Goal: Risk for impaired skin integrity will decrease Outcome: Progressing   Problem: Health Behavior/Discharge Planning: Goal: Ability to manage health-related needs will improve Outcome: Progressing   Problem: Clinical Measurements: Goal: Ability to maintain clinical measurements within normal limits will improve Outcome: Progressing Goal: Will remain free from infection Outcome: Progressing Goal: Diagnostic test results will improve Outcome: Progressing Goal: Respiratory complications will improve Outcome: Progressing Goal: Cardiovascular complication will be avoided Outcome: Progressing   Problem: Pain Managment: Goal: General experience of comfort will improve and/or be controlled Outcome: Progressing   Problem: Safety: Goal: Ability to remain free from injury will improve Outcome: Progressing   Wendi Dash, RN

## 2024-07-03 NOTE — Progress Notes (Signed)
 Occupational Therapy Treatment Patient Details Name: [name] [name] MRN: [id number] DOB: 22-Aug-1960 Today's Date: 07/03/2024   History of present illness 64 yrs old male with  who presents from podiatry clinic with bilateral foot gangrene and nonhealing wounds. Patient underwent left foot ray amputation on 03/25/24 and Right TMA on 04/26/24.  Despite these intervention and multiple prior revascularization procedures he now presents with dry gangrene/necrotic eschar at the right TMA site and nonhealing wounds at the left TMA site. MRI Right Foot > highly suspicious for bone infarcts and osteomyelitis. Diffuse cellulitis and myofasciitis. MRI left foot > Open wounds along mid metatarsal surgical site, soft tissue inflammation consistent with tissue necrosis, nonviable tissue, osteomyelitis.  Patient underwent revision of left foot transmetatarsal to lisfranc amputation. 06/26/23. 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 PMH significant for poorly controlled type 2 diabetes mellitus, End-stage renal disease on hemodialysis (M/W/F schedule), severe peripheral arterial disease, hyperlipidemia, hypothyroidism, and cardiomyopathy.   OT comments  Pt premedicated for pain prior to visit. Focus of session on supine to sit at bed level with pt self assisting using B UEs on bed rails. Completed x 3 with max +2 progressing to min assist. Pt with zero sitting balance in long sitting and flexed posture. Rolled with total assist for placement of sheet and bed pad in preparation for transfer to air mattress bed. Pt completed hand washing with set up, donned gown with max assist in supine. Pt requires multiple verbal cues to avoid placing L foot on mattress. Patient will benefit from continued inpatient follow up therapy, <3 hours/day.       If plan is discharge home, recommend the following:  Two people to help with walking and/or transfers;A lot of help with bathing/dressing/bathroom;Assistance with  cooking/housework;Direct supervision/assist for medications management;Direct supervision/assist for financial management;Assist for transportation;Help with stairs or ramp for entrance   Equipment Recommendations  None recommended by OT    Recommendations for Other Services      Precautions / Restrictions Precautions Precautions: Fall Recall of Precautions/Restrictions: Impaired Precaution/Restrictions Comments: Contact precs; poor recall of BLE NWB status; lability Restrictions Weight Bearing Restrictions Per Provider Order: Yes RLE Weight Bearing Per Provider Order: Non weight bearing LLE Weight Bearing Per Provider Order: Non weight bearing Other Position/Activity Restrictions: pt often non-compliant in supine       Mobility Bed Mobility Overal bed mobility: Needs Assistance Bed Mobility: Rolling, Supine to Sit Rolling: Total assist   Supine to sit: +2 for physical assistance, Max assist     General bed mobility comments: rolled for placement of bed pad and sheet for supine transfer to new bed, pulled to sit x 3 with bed rails with HOB up slightly for back to be rubbed with washcloth    Transfers                   General transfer comment: transferred with total assist in supine to new bed     Balance Overall balance assessment: Needs assistance Sitting-balance support: Bilateral upper extremity supported, Feet unsupported Sitting balance-Leahy Scale: Zero Sitting balance - Comments: B UE and min to max assist to maintain in long sitting Postural control: Posterior lean                                 ADL either performed or assessed with clinical judgement   ADL Overall ADL's : Needs assistance/impaired     Grooming: Wash/dry hands;Bed  level;Set up           Upper Body Dressing : Maximal assistance;Bed level                          Extremity/Trunk Assessment              Vision       Perception     Praxis      Communication Communication Communication: No apparent difficulties   Cognition Arousal: Alert Behavior During Therapy: Flat affect, Lability Cognition: Cognition impaired     Awareness: Intellectual awareness impaired, Online awareness impaired Memory impairment (select all impairments): Short-term memory, Declarative long-term memory, Working memory, Non-declarative long-term memory Attention impairment (select first level of impairment): Focused attention Executive functioning impairment (select all impairments): Initiation, Sequencing, Reasoning, Problem solving                   Following commands: Impaired Following commands impaired: Follows one step commands inconsistently, Only follows one step commands consistently      Cueing   Cueing Techniques: Verbal cues, Gestural cues, Tactile cues, Visual cues  Exercises      Shoulder Instructions       General Comments      Pertinent Vitals/ Pain       Pain Assessment Pain Assessment: Faces Faces Pain Scale: Hurts even more Pain Location: BLE R>L Pain Descriptors / Indicators: Grimacing, Moaning, Operative site guarding, Restless, Discomfort Pain Intervention(s): Monitored during session, Repositioned, Premedicated before session  Home Living                                          Prior Functioning/Environment              Frequency  Min 2X/week        Progress Toward Goals  OT Goals(current goals can now be found in the care plan section)  Progress towards OT goals: Progressing toward goals  Acute Rehab OT Goals OT Goal Formulation: With patient/family Time For Goal Achievement: 07/17/24 Potential to Achieve Goals: Fair  Plan      Co-evaluation    PT/OT/SLP Co-Evaluation/Treatment: Yes Reason for Co-Treatment: For patient/therapist safety;Necessary to address cognition/behavior during functional activity   OT goals addressed during session: ADL's and self-care       AM-PAC OT 6 Clicks Daily Activity     Outcome Measure   Help from another person eating meals?: A Little Help from another person taking care of personal grooming?: A Little Help from another person toileting, which includes using toliet, bedpan, or urinal?: Total Help from another person bathing (including washing, rinsing, drying)?: A Lot Help from another person to put on and taking off regular upper body clothing?: A Lot Help from another person to put on and taking off regular lower body clothing?: Total 6 Click Score: 12    End of Session    OT Visit Diagnosis: Unsteadiness on feet (R26.81);Muscle weakness (generalized) (M62.81);Other symptoms and signs involving cognitive function;Pain   Activity Tolerance Patient limited by pain   Patient Left in bed;with call bell/phone within reach;with family/visitor present;Other (comment) (going to HD)   Nurse Communication          Time: [phone] OT Time Calculation (min): 23 min  Charges: OT General Charges $OT Visit: 1 Visit OT Treatments $Therapeutic Activity: 8-22 mins  Kennth Mliss Helling 07/03/2024,  3:24 PM Mliss HERO, OTR/L Acute Rehabilitation Services Office: [phone]

## 2024-07-03 NOTE — Progress Notes (Signed)
 Patient adamantly refusing lab draws x2 this morning. This nurse attempted to educate patient on importance of labs, but patient continues to refuse stating I'm resting but no further explanation for why he is refusing. This nurse was informed by phlebotomy team that lab orders will be cancelled since patient has refused twice, Dr. Sonjia made aware.

## 2024-07-03 NOTE — Progress Notes (Signed)
 Pt refused lab this am. We will hand off to the day shift team. Possibly we can get lab CBC and renal function during the hemodialysis today.   Wendi Dash, RN

## 2024-07-03 NOTE — Progress Notes (Signed)
 Physical Therapy Treatment Patient Details Name: [name] [name] MRN: [id number] DOB: [birth year] Today's Date: 07/03/2024   History of Present Illness 64 yrs old male with  who presents from podiatry clinic with bilateral foot gangrene and nonhealing wounds. Patient underwent left foot ray amputation on 03/25/24 and Right TMA on 04/26/24.  Despite these intervention and multiple prior revascularization procedures he now presents with dry gangrene/necrotic eschar at the right TMA site and nonhealing wounds at the left TMA site. MRI Right Foot > highly suspicious for bone infarcts and osteomyelitis. Diffuse cellulitis and myofasciitis. MRI left foot > Open wounds along mid metatarsal surgical site, soft tissue inflammation consistent with tissue necrosis, nonviable tissue, osteomyelitis.  Patient underwent revision of left foot transmetatarsal to lisfranc amputation. 06/26/23. 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 PMH significant for poorly controlled type 2 diabetes mellitus, End-stage renal disease on hemodialysis (M/W/F schedule), severe peripheral arterial disease, hyperlipidemia, hypothyroidism, and cardiomyopathy.    PT Comments  Pt received in supine, anxious and with poor awareness of his BLE NWB precautions post-op, pt c/o pain in BLE despite premedication by RN. Pt with limited effort for L/R rolling for placement of sheet/bed pads under his hips for ease of transfer from old bed to new air bed. Pt needing TotalA +3-4 assist for supine lateral slide from bed to bed. Pt with zero seated balance in long sitting with multiple attempts. Unable to assess EOB seated balance this date due to transport team arrival at end of session to take him to HD unit. Of note, per his mother, pt typically does not wear clothing at home (only underwear?) due pt sensory sensitivity and feeling itchy frequently. Patient will benefit from continued inpatient follow up therapy, <3 hours/day, and pt/mother  would benefit from information on resources for assisted living or home caregiver support post-SNF as mother is elderly and pt has increased care needs now that he is BLE NWB.   If plan is discharge home, recommend the following: Two people to help with walking and/or transfers;Two people to help with bathing/dressing/bathroom;Help with stairs or ramp for entrance;Assist for transportation;Assistance with cooking/housework;Direct supervision/assist for medications management;Direct supervision/assist for financial management;Supervision due to cognitive status   Can travel by private vehicle     No  Equipment Recommendations  None recommended by PT (Currently would need hoyer lift, hospital air bed, drop arm wheelchair with modification for his R BKA)    Recommendations for Other Services       Precautions / Restrictions Precautions Precautions: Fall Recall of Precautions/Restrictions: Impaired Precaution/Restrictions Comments: Contact precs; poor recall of BLE NWB status; lability Restrictions Weight Bearing Restrictions Per Provider Order: Yes RLE Weight Bearing Per Provider Order: Non weight bearing LLE Weight Bearing Per Provider Order: Non weight bearing Other Position/Activity Restrictions: pt often non-compliant in supine     Mobility  Bed Mobility Overal bed mobility: Needs Assistance Bed Mobility: Rolling, Supine to Sit Rolling: Total assist   Supine to sit: +2 for physical assistance, Max assist, Mod assist, Used rails     General bed mobility comments: rolled for placement of bed pad and sheet for supine transfer to new air bed, pulled to sit x 3 with bed rails with HOB up slightly for back to be rubbed with washcloth, from maxA +2 progressing to min/modA +1 with pt able to use bed rails to assist.    Transfers                   General  transfer comment: Total assist +4 (for safety) in supine via lateral slide to new air bed.    Ambulation/Gait                General Gait Details: N/A; pt NWB BLE   Stairs             Wheelchair Mobility     Tilt Bed    Modified Rankin (Stroke Patients Only)       Balance Overall balance assessment: Needs assistance Sitting-balance support: Bilateral upper extremity supported, Feet unsupported Sitting balance-Leahy Scale: Zero Sitting balance - Comments: B UE and min to max assist to maintain in long sitting with side rail support Postural control: Posterior lean     Standing balance comment: pt NWB BLE                            Communication Communication Communication: No apparent difficulties  Cognition Arousal: Alert Behavior During Therapy: Lability, Anxious   PT - Cognitive impairments: History of cognitive impairments, Attention, Memory, Sequencing, Problem solving, Safety/Judgement   Orientation impairments: Situation                   PT - Cognition Comments: Pt familiar to this therapist from admission 03/2023, also presented on that admission with emotional lability and restlessness and poor attention to task. Pt does not have mitts on this session, and with slightly improved command following, but reluctant to attempt some requested tasks due to preference to rest/be comfortable. Of note, PTA asked his mother what he wears at home, since he does not like wearing hospital gown and c/o feeling itchy often. She states he usually wears no clothing/just underwear at home due to sensory sensitivity. Unsure what his cognitive baseline is but he appears similar to prior presentation in 2024 when this therapist worked with him previously, but is more limited now due to BLE NWB status and perseveration on pain/discomfort. Following commands: Impaired Following commands impaired: Follows one step commands inconsistently, Only follows one step commands consistently    Cueing Cueing Techniques: Verbal cues, Gestural cues, Tactile cues, Visual cues  Exercises  Other Exercises Other Exercises: supine BLE A/AAROM: SLR ~3 reps ea (mostly AROM, AA at times for initiation), R knee extension ~3 reps with AA to initiate, pt limited participation due to pain and arrival of transport once pt in new bed to take him to HD unit. Other Exercises: cues for bed crunches pulling to long sit with bil side rails, pt performs x3 reps with mod/maxA (+2 assist and side rails to pull up)    General Comments General comments (skin integrity, edema, etc.): Despite premedication, pt c/o BLE pain, and needs dense cues to avoid pushing through mattress with BLE, esp his LLE.      Pertinent Vitals/Pain Pain Assessment Pain Assessment: Faces Faces Pain Scale: Hurts even more Pain Location: BLE R>L Pain Descriptors / Indicators: Grimacing, Moaning, Operative site guarding, Restless, Discomfort Pain Intervention(s): Limited activity within patient's tolerance, Monitored during session, Premedicated before session, Repositioned    Home Living                          Prior Function            PT Goals (current goals can now be found in the care plan section) Acute Rehab PT Goals Patient Stated Goal: For pain to go down and to be able  to go home. PT Goal Formulation: With patient Time For Goal Achievement: 07/12/24 Progress towards PT goals: Progressing toward goals (slowly)    Frequency    Min 2X/week      PT Plan      Co-evaluation PT/OT/SLP Co-Evaluation/Treatment: Yes Reason for Co-Treatment: For patient/therapist safety;Necessary to address cognition/behavior during functional activity PT goals addressed during session: Mobility/safety with mobility;Balance;Strengthening/ROM OT goals addressed during session: ADL's and self-care      AM-PAC PT 6 Clicks Mobility   Outcome Measure  Help needed turning from your back to your side while in a flat bed without using bedrails?: A Lot Help needed moving from lying on your back to sitting on  the side of a flat bed without using bedrails?: Total Help needed moving to and from a bed to a chair (including a wheelchair)?: Total Help needed standing up from a chair using your arms (e.g., wheelchair or bedside chair)?: Total Help needed to walk in hospital room?: Total Help needed climbing 3-5 steps with a railing? : Total 6 Click Score: 7    End of Session Equipment Utilized During Treatment: Other (comment) (bed pad assist) Activity Tolerance: Patient limited by pain;Other (comment) (apparent intellectual disability (similar to 2024 admission), anxiety/pain) Patient left: in bed;with call bell/phone within reach;with bed alarm set;Other (comment) (transport team taking him to HD appointment) Nurse Communication: Mobility status;Need for lift equipment;Patient requests pain meds;Other (comment);Weight bearing status (would need hoyer for any OOB mobility) PT Visit Diagnosis: Other abnormalities of gait and mobility (R26.89);Muscle weakness (generalized) (M62.81);Pain Pain - part of body: Knee;Leg;Ankle and joints of foot (LLE foot/ankle)     Time: 8555-8490 PT Time Calculation (min) (ACUTE ONLY): 25 min  Charges:    $Therapeutic Activity: 8-22 mins PT General Charges $$ ACUTE PT VISIT: 1 Visit                     Zhion Pevehouse P., PTA Acute Rehabilitation Services Secure Chat Preferred 9a-5:30pm Office: [phone]    Connell [facility] 07/03/2024, 3:56 PM

## 2024-07-03 NOTE — Consult Note (Signed)
 "                                                                                   Consultation Note Date: 07/03/2024   Patient Name: [name] [name]  DOB: Oct 24, 1960  MRN: [id number]  Age / Sex: 64 y.o., male  PCP: Kip Righter, MD Referring Physician: Sonjia Held, MD  Reason for Consultation: Establishing goals of care  HPI/Patient Profile: 64 y.o. male  with past medical history of poorly controlled diabetes mellitus type 2, ESRD on HD MWF, severe PAD, HTN, cardiomyopathy, hypothyroidism admitted on 06/23/2024 with bilateral foot gangrene and nonhealing wounds. Palliative care consulted for goals of care as he is refusing care and poor intake.   1/13 revision L TMA 1/14 R BKA 1/21 palliative consult  Clinical Assessment and Goals of Care: Consult received and chart review completed. I discussed with RN. I met today with [name] [name] and mother, [name] [name], at bedside. [name] [name] was present and would answer questions but did not really engage in conversation until specifically asked a question. They share that he has been on HD for ~1 year. This has been a difficult year. [name] [name] takes care of him and says he moved in with her to care for him when he started HD. She has one other son and a granddaughter who live in [place] and [place].   We discussed goals of care. We reviewed poor intake and any symptoms or complaints that he has that could be impacting him. He denies any symptoms. He did vomit his meal up yesterday. [name] [name] says that he eats better for her and when she orders him food he likes. We discussed concern for intake and potential of feeding tube. They both confirm he would never want a feeding tube as another family member had one and it was a horrible experience. We also discussed code status and after some discussion Kaileb confirms DNR/DNI status - [name] [name] supports his decision. He states that he tolerates dialysis and wishes to continue. He also agrees to SNF rehab. I encouraged him to  try and eat better, take him medications, and work with therapy so he can get out of the hospital. He says that he will. [name] [name] shares that he tries to refuse his medications at home but she just tells him he has to take them and he does. She is hoping that he can return home after rehab where she will continue as his caregiver.   All questions/concerns addressed to the best of my ability. Emotional support provided.   Primary Decision Maker Patient and [name]    SUMMARY OF RECOMMENDATIONS   - DNR - No feeding tube - SNF rehab with hopes of returning home with mother - Continue with HD (although mother requests earlier HD time as she does not feel comfortable driving at night)  Code Status/Advance Care Planning: DNR   Symptom Management:  Per attending, renal  Prognosis:  Overall long term prognosis poor  Discharge Planning: Skilled Nursing Facility for rehab with Palliative care service follow-up      Primary Diagnoses: Present on Admission:  Gangrene of foot (HCC)   I have reviewed the medical record, interviewed the patient  and family, and examined the patient. The following aspects are pertinent.  Past Medical History:  Diagnosis Date   Anemia    Arthritis    Cardiomyopathy [facility])    July 2022   Diabetes mellitus [facility])    Dialysis patient    HD MWF   GERD (gastroesophageal reflux disease)    HTN (hypertension)    Hyperlipidemia    Hypothyroidism    Nephrotic syndrome    HD MWF   Obesity    Peripheral vascular disease    Pulmonary hypertension (HCC)    July 2022 in setting of nephrotic syndrome   Secondary hyperparathyroidism of renal origin    Social History   Socioeconomic History   Marital status: Single    Spouse name: Not on file   Number of children: Not on file   Years of education: Not on file   Highest education level: Not on file  Occupational History   Not on file  Tobacco Use   Smoking status: Never   Smokeless tobacco: Never   Vaping Use   Vaping status: Never Used  Substance and Sexual Activity   Alcohol use: Never   Drug use: Never   Sexual activity: Not on file  Other Topics Concern   Not on file  Social History Narrative   Not on file   Social Drivers of Health   Tobacco Use: Low Risk (06/26/2024)   Patient History    Smoking Tobacco Use: Never    Smokeless Tobacco Use: Never    Passive Exposure: Not on file  Financial Resource Strain: Not on file  Food Insecurity: No Food Insecurity (06/24/2024)   Epic    Worried About Programme Researcher, Broadcasting/film/video in the Last Year: Never true    Ran Out of Food in the Last Year: Never true  Transportation Needs: No Transportation Needs (06/24/2024)   Epic    Lack of Transportation (Medical): No    Lack of Transportation (Non-Medical): No  Physical Activity: Not on file  Stress: Not on file  Social Connections: Socially Isolated (06/24/2024)   Social Connection and Isolation Panel    Frequency of Communication with Friends and Family: Once a week    Frequency of Social Gatherings with Friends and Family: Once a week    Attends Religious Services: Never    Database Administrator or Organizations: No    Attends Banker Meetings: Never    Marital Status: Never married  Depression (PHQ2-9): Not on file  Alcohol Screen: Not on file  Housing: Low Risk (06/24/2024)   Epic    Unable to Pay for Housing in the Last Year: No    Number of Times Moved in the Last Year: 0    Homeless in the Last Year: No  Utilities: Not At Risk (06/24/2024)   Epic    Threatened with loss of utilities: No  Health Literacy: Not on file   Family History  Problem Relation Age of Onset   Hypertension Mother    Diabetes Mother    Cancer Father        prostate cancer   Hypertension Maternal Grandfather    Rheum arthritis Maternal Grandmother    CAD Neg Hx    Scheduled Meds:  (feeding supplement) PROSource Plus  30 mL Oral BID AC   amitriptyline   10 mg Oral QHS   amLODipine   10  mg Oral q morning   apixaban   2.5 mg Oral BID   ascorbic acid   250 mg  Oral BID   atorvastatin   40 mg Oral Daily   calcium  acetate  667 mg Oral TID WC   Chlorhexidine  Gluconate Cloth  6 each Topical Q0600   feeding supplement  237 mL Oral BID BM   levothyroxine   50 mcg Oral QAC breakfast   metoprolol  tartrate  50 mg Oral BID   multivitamin  1 tablet Oral QHS   nutrition supplement (JUVEN)  1 packet Oral BID BM   pantoprazole   40 mg Oral Daily   polyethylene glycol  17 g Oral Daily   senna-docusate  2 tablet Oral Q1200   thiamine   100 mg Oral Daily   Continuous Infusions:  sodium chloride  75 mL/hr at 06/25/24 0507   anticoagulant sodium citrate      PRN Meds:.acetaminophen  **OR** acetaminophen , anticoagulant sodium citrate , hydrALAZINE , HYDROmorphone  (DILAUDID ) injection, liver oil-zinc  oxide, ondansetron  **OR** ondansetron  (ZOFRAN ) IV, mouth rinse, oxyCODONE , phenol, sodium chloride  flush Allergies[1] Review of Systems  Constitutional:  Positive for activity change, appetite change and fatigue.    Physical Exam Vitals and nursing note reviewed.  Constitutional:      General: He is awake. He is not in acute distress.    Appearance: He is ill-appearing.  Cardiovascular:     Rate and Rhythm: Normal rate.  Pulmonary:     Effort: No tachypnea, accessory muscle usage or respiratory distress.  Abdominal:     Palpations: Abdomen is soft.  Neurological:     Mental Status: He is alert.     Comments: Mostly oriented     Vital Signs: BP (!) 150/70 (BP Location: Left Arm)   Pulse 73   Temp 98.9 F (37.2 C)   Resp 16   Ht 5' 10 (1.778 m)   Wt 60.2 kg   SpO2 95%   BMI 19.04 kg/m  Pain Scale: 0-10 POSS *See Group Information*: S-Acceptable,Sleep, easy to arouse Pain Score: 0-No pain   SpO2: SpO2: 95 % O2 Device:SpO2: 95 % O2 Flow Rate: .O2 Flow Rate (L/min): 2 L/min  IO: Intake/output summary:  Intake/Output Summary (Last 24 hours) at 07/03/2024 1249 Last data filed at  07/02/2024 2032 Gross per 24 hour  Intake 250 ml  Output --  Net 250 ml    LBM: Last BM Date : 07/02/24 Baseline Weight: Weight: 74.8 kg Most recent weight: Weight: 60.2 kg       Time Total: 60 min  Greater than 50%  of this time was spent counseling and coordinating care related to the above assessment and plan.  Signed by: Bernarda Kitty, NP Palliative Medicine Team Pager # [phone] (M-F 8a-5p) Team Phone # [phone] (Nights/Weekends)                      [1] No Known Allergies  "

## 2024-07-03 NOTE — Progress Notes (Signed)
 " [facility] Progress Note   Subjective:    Seen and examined patient at bedside. Patient's mother also at bedside. No acute issues. Plan for HD this afternoon. Noted PT recommended SNF placement.  Objective Vitals:   07/02/24 1452 07/02/24 2032 07/03/24 0332 07/03/24 0744  BP: (!) 140/71 (!) 146/75 (!) 140/68 (!) 150/70  Pulse: 77 80 67 73  Resp: 17 18 19 16   Temp: 98.6 F (37 C) (!) 97.3 F (36.3 C) 98.6 F (37 C) 98.9 F (37.2 C)  TempSrc: Oral Oral Oral   SpO2: 97% 100% 99% 95%  Weight:      Height:       Physical Exam General: Awake, NAD Head: NCAT sclera not icteric  Lungs: unlabored, normal wob Heart: RRR Abdomen: soft and non-tender  Lower extremities: No LE edema; rt bka, L TMA Neuro: awake, flat affect Dialysis Access: R [facility]  Filed Weights   06/29/24 1245 07/01/24 0810 07/01/24 1205  Weight: 57.3 kg 62.1 kg 60.2 kg    Intake/Output Summary (Last 24 hours) at 07/03/2024 1429 Last data filed at 07/02/2024 2032 Gross per 24 hour  Intake 250 ml  Output --  Net 250 ml    Additional Objective Labs: Basic Metabolic Panel: Recent Labs  Lab 06/28/24 1421 07/01/24 0840 07/02/24 0406  NA 132* 137 133*  K 4.1 3.9 4.1  CL 95* 97* 92*  CO2 23 24 24   GLUCOSE 136* 78 93  BUN 39* 40* 18  CREATININE 5.08* 5.74* 3.88*  CALCIUM  9.1 8.9 9.0  PHOS 3.3 4.9*  --    Liver Function Tests: Recent Labs  Lab 06/28/24 1421  ALBUMIN  2.9*   No results for input(s): LIPASE, AMYLASE in the last 168 hours. CBC: Recent Labs  Lab 07/01/24 0840 07/01/24 1445 07/02/24 0406  WBC 12.1* 11.4* 11.0*  NEUTROABS  --   --  8.7*  HGB 11.0* 12.1* 12.9*  HCT 35.6* 39.8 41.8  MCV 93.9 93.0 93.1  PLT 205 211 204   Blood Culture    Component Value Date/Time   SDES TISSUE 06/25/2024 1020   SPECREQUEST LEFT FOOT TISSUE 06/25/2024 1020   CULT  06/25/2024 1020    ABUNDANT STAPHYLOCOCCUS AUREUS RARE PSEUDOMONAS FLUORESCENS CRITICAL RESULT CALLED TO, READ  BACK BY AND VERIFIED WITH: RN ANGEL M [id number] FCP SUSCEPTIBILITIES PERFORMED ON PREVIOUS CULTURE WITHIN THE LAST 5 DAYS. STAPHYLOCOCCUS AUREUS    REPTSTATUS 06/30/2024 FINAL 06/25/2024 1020    Cardiac Enzymes: No results for input(s): CKTOTAL, CKMB, CKMBINDEX, TROPONINI in the last 168 hours. CBG: Recent Labs  Lab 06/29/24 1729 07/01/24 1236 07/01/24 1259 07/01/24 1620  GLUCAP 91 70 78 105*   Iron Studies: No results for input(s): IRON, TIBC, TRANSFERRIN, FERRITIN in the last 72 hours. Lab Results  Component Value Date   INR 1.2 05/13/2024   INR 1.1 05/19/2023   INR 1.1 03/06/2023   Studies/Results: No results found.  Medications:  sodium chloride  75 mL/hr at 06/25/24 0507   anticoagulant sodium citrate       (feeding supplement) PROSource Plus  30 mL Oral BID AC   amitriptyline   10 mg Oral QHS   amLODipine   10 mg Oral q morning   apixaban   2.5 mg Oral BID   ascorbic acid   250 mg Oral BID   atorvastatin   40 mg Oral Daily   calcium  acetate  667 mg Oral TID WC   Chlorhexidine  Gluconate Cloth  6 each Topical Q0600   feeding supplement  237 mL  Oral BID BM   levothyroxine   50 mcg Oral QAC breakfast   metoprolol  tartrate  50 mg Oral BID   multivitamin  1 tablet Oral QHS   nutrition supplement (JUVEN)  1 packet Oral BID BM   pantoprazole   40 mg Oral Daily   polyethylene glycol  17 g Oral Daily   senna-docusate  2 tablet Oral Q1200   thiamine   100 mg Oral Daily    Dialysis Orders: MWF - [facility] 4hrs, BFR 400, DFR Auto 1.5,  EDW 63.8kg, 3K/ 2.5Ca Mircera 225 mcg q2wks - last 06/21/24  Assessment/Plan: Gangrene left/right foot, s/p b/l TMA - Podiatry and VVS following. S/p left-sided TMA revision 1/13, s/p rt bka 1/14. VVS following Delirium - per primary ESRD - on HD MWF sched, next HD this afternoon Hypertension/volume  - Bps seem to go down with HD. Monitor trend for now. Continue Amlodipine  10mg  daily and Metoprolol  50mg  BID. UF as  tolerated. Anemia of CKD - ESA given 1/9 in outpatient, not due yet, hgb 12.9-at goal Secondary Hyperparathyroidism -  po4 acceptable Nutrition - Renal diet with fluid restriction Dispo - Noted PT recommended SNF placement  Charmaine Piety, NP [facility] 07/03/2024,2:29 PM  LOS: 10 days    "

## 2024-07-04 ENCOUNTER — Ambulatory Visit (HOSPITAL_COMMUNITY)

## 2024-07-04 ENCOUNTER — Ambulatory Visit

## 2024-07-04 DIAGNOSIS — I96 Gangrene, not elsewhere classified: Secondary | ICD-10-CM | POA: Diagnosis not present

## 2024-07-04 MED ORDER — CHLORHEXIDINE GLUCONATE CLOTH 2 % EX PADS
6.0000 | MEDICATED_PAD | Freq: Every day | CUTANEOUS | Status: DC
  Administered 2024-07-05 – 2024-07-08 (×4): 6 via TOPICAL

## 2024-07-04 NOTE — TOC Progression Note (Addendum)
 Transition of Care [facility]) - Progression Note    Patient Details  Name: [name] [name] MRN: [id number] Date of Birth: September 15, 1960  Transition [facility]) CM/SW Contact  Bridget Cordella Simmonds, LCSW Phone Number: 07/04/2024, 11:11 AM  Clinical Narrative: TC Tammy/HTA: SNF auth: Dr Janit offering P2P, [phone] by 1500 today.  MD informed.    1200: CSW informed Dr Iantha asking to speak with CSW, spoke with pt and mother prior to calling Dr Janit. Pt has been able to transfer in and out of wheelchair and in and out of vehicle with a little help from his mother, who still transports him to HD.  CSW spoke with Dr Janit by phone, discussed pt home situation with his elderly mother, Dr Janit going to speak with her as well.  1445: Second call with Dr Janit.  Pt mother reporting concerns pt will refuse to go to SNF, also discussed medicaid screening.  Message sent to [facility] counseling: he can screen for [employer].  CSW met with pt and mother in room again.  Pt remains oriented x2.  Pt does appear to engage, reports he does want to go home.  Somewhat lengthy discussion with pt and his mother regarding pt needing to regain his ability to transfer after his surgeries/amputations.  Discussed mother cannot do all the work with his mobility, pt appears to be listening, does not respond much.  Need for SNF rehab verbalized to him several times by CSW and mother, he was not objecting.   Expected Discharge Plan: Skilled Nursing Facility Barriers to Discharge: Continued Medical Work up, SNF Pending bed offer               Expected Discharge Plan and Services In-house Referral: Clinical Social Work   Post Acute Care Choice: Skilled Nursing Facility Living arrangements for the past 2 months: Single Family Home                                       Social Drivers of Health (SDOH) Interventions SDOH Screenings   Food Insecurity: No Food Insecurity  (06/24/2024)  Housing: Low Risk (06/24/2024)  Transportation Needs: No Transportation Needs (06/24/2024)  Utilities: Not At Risk (06/24/2024)  Social Connections: Socially Isolated (06/24/2024)  Tobacco Use: Low Risk (06/26/2024)    Readmission Risk Interventions    03/28/2024    1:14 PM 03/29/2023    1:45 PM 03/07/2023    1:31 PM  Readmission Risk Prevention Plan  Transportation Screening Complete Complete Complete  HRI or Home Care Consult   Complete  Palliative Care Screening   Not Applicable  Medication Review (RN Care Manager) Complete Complete Complete  PCP or Specialist appointment within 3-5 days of discharge  Complete   HRI or Home Care Consult Complete Complete   SW Recovery Care/Counseling Consult Patient refused Complete   Palliative Care Screening Complete Complete   Skilled Nursing Facility Patient Refused Complete

## 2024-07-04 NOTE — Progress Notes (Signed)
 " PROGRESS NOTE  [name] [name] FMW:[id number] DOB: [birth year] DOA: 06/23/2024 PCP: Kip Righter, MD   LOS: 11 days   Brief narrative:  [name] [name] is a 64 y o male with poorly controlled type 2 diabetes mellitus, ESRD on hemodialysis (M/W/F schedule), severe peripheral arterial disease, hyperlipidemia, hypothyroidism, and cardiomyopathy who presented to podiatry clinic with bilateral foot gangrene and nonhealing wounds.  Patient had undergone left foot ray amputation on 03/25/24 and Right TMA on 04/26/24.  Despite these intervention and multiple prior revascularization procedures, patient presented with dry gangrene/necrotic eschar at the right TMA site and nonhealing wounds at the left TMA site.  Patient has had extensive vascular history including multiple lower extremity angiograms with interventions (01/30/2024, 03/20/2024, 04/16/2024) including peripheral atherectomy, intravascular lithotripsy, and angioplasty. He has bilateral AV grafts for dialysis access (right arm 08/17/2023, left arm 12/27/2022) and currently has a right internal jugular dialysis catheter.  Vascular surgery and podiatry were consulted. 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      Assessment/Plan: Principal Problem:   Gangrene of foot (HCC)  Dry gangrene / Necrotic eschar Right TMA  Left foot nonhealing post TMA : Bilateral Osteomyelitis: Severe PAD / Limb ischemia: Continue vancomycin , cefepime  and Flagyl . MRI of the right foot highly suspicious for bone infarcts and osteomyelitis.  Diffuse cellulitis and myofasciitis.  MRI of the left foot showed open wounds,along mid metatarsal surgical site, soft tissue inflammation consistent with tissue necrosis,  nonviable tissue.  Osteomyelitis.  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  Plan is nonweightbearing of  bilateral lower extremity.  Follow-up outpatient in 4 to 6 weeks for staple removal.   End-stage renal disease on hemodialysis MWF Nephrology on board..  Continue dialysis as per nephrology.   Type 2 diabetes melitis   Continue to monitor.  Patient has been refusing medications and food intermittently   Chronic combined systolic and diastolic CHF: Appears euvolemic.  Chest x-ray showed cardiomegaly.  Continue volume management with hemodialysis.   Hypothyroidism: Continue Synthroid    Essential hypertension: Continue amlodipine  and metoprolol , has been noncompliant with medication.   GERD: Continue Protonix .  Debility deconditioning. PT recommends skilled nursing facility placement  Impaired intake.  Intermittently eating his preferred food.  Palliative care on board and no plan for artificial tube feeds.  DVT prophylaxis: apixaban  (ELIQUIS ) tablet 2.5 mg Start: 07/01/24 1445 apixaban  (ELIQUIS ) tablet 2.5 mg   Disposition: Skilled nursing facility as per PT evaluation I did peer to peer review with Dr. Janit regarding skilled nursing facility, decision pending  Status is: Inpatient Remains inpatient appropriate because: Need for skilled nursing facility placement.    Code Status:     Code Status: Limited: Do not attempt resuscitation (DNR) -DNR-LIMITED -Do Not Intubate/DNI   Family Communication: Spoke with the patient's mother on 07/02/24  Consultants: Nephrology Orthopedics Palliative care  Procedures: Hemodialysis Right BKA by vascular surgery on 06/26/2024.   Anti-infectives:  Vancomycin  IV on Monday Wednesday Friday  Anti-infectives (From admission, onward)    Start     Dose/Rate Route Frequency Ordered Stop   06/28/24 1200  vancomycin  (VANCOREADY) IVPB 750 mg/150 mL  Status:  Discontinued        750 mg 150 mL/hr over 60 Minutes Intravenous Every M-W-F (Hemodialysis) 06/26/24 1356 06/26/24 1410   06/28/24 1000  doxycycline  (VIBRA -TABS) tablet 100 mg  100 mg Oral Every 12 hours 06/28/24 0852 07/02/24 2258   06/26/24 1430  vancomycin  (VANCOREADY) IVPB 750 mg/150 mL  Status:  Discontinued        750 mg 150 mL/hr over 60 Minutes Intravenous Every M-W-F (Hemodialysis) 06/26/24 1410 06/28/24 0852   06/24/24 1200  vancomycin  (VANCOREADY) IVPB 750 mg/150 mL  Status:  Discontinued        750 mg 150 mL/hr over 60 Minutes Intravenous Every M-W-F (Hemodialysis) 06/23/24 2022 06/26/24 1356   06/23/24 2030  metroNIDAZOLE  (FLAGYL ) IVPB 500 mg  Status:  Discontinued        500 mg 100 mL/hr over 60 Minutes Intravenous Every 12 hours 06/23/24 2018 06/28/24 0852   06/23/24 2030  ceFEPIme  (MAXIPIME ) 1 g in sodium chloride  0.9 % 100 mL IVPB  Status:  Discontinued        1 g 200 mL/hr over 30 Minutes Intravenous Every 24 hours 06/23/24 2021 06/28/24 0852   06/23/24 2030  vancomycin  (VANCOREADY) IVPB 1500 mg/300 mL        1,500 mg 150 mL/hr over 120 Minutes Intravenous  Once 06/23/24 2022 06/24/24 0152       Subjective:  Today, patient was seen and examined at bedside.  Patient denies any nausea vomiting fever chills or rigor.  Has been intermittently eating.   Objective: Vitals:   07/04/24 0327 07/04/24 0802  BP: (!) 157/86 (!) 142/86  Pulse: 74 67  Resp: 13 16  Temp: 98.5 F (36.9 C) 97.6 F (36.4 C)  SpO2: 99% 98%    Intake/Output Summary (Last 24 hours) at 07/04/2024 1155 Last data filed at 07/03/2024 2130 Gross per 24 hour  Intake 220 ml  Output 1000 ml  Net -780 ml   Filed Weights   07/01/24 1205 07/03/24 1525 07/03/24 1909  Weight: 60.2 kg 61.7 kg 61.6 kg   Body mass index is 19.49 kg/m.   Physical Exam:  General: Thinly built, not in obvious distress HENT:   No scleral pallor or icterus noted. Oral mucosa is moist.  Right internal jugular hemodialysis catheter in place Chest: Diminished breath sounds bilaterally CVS: S1 &S2 heard. No murmur.  Regular rate and rhythm. Abdomen: Soft, nontender, nondistended.  Bowel sounds are  heard.   Extremities: No cyanosis, clubbing or edema amputation of the foot on the left, right below-knee amputation. Psych: Alert, awake and Communicative. CNS:  No cranial nerve deficits.  Moves all extremities. Skin: Warm and dry.  No rashes noted.   Data Review: I have personally reviewed the following laboratory data and studies,  CBC: Recent Labs  Lab 07/01/24 0840 07/01/24 1445 07/02/24 0406 07/03/24 1538  WBC 12.1* 11.4* 11.0* 9.5  NEUTROABS  --   --  8.7*  --   HGB 11.0* 12.1* 12.9* 11.3*  HCT 35.6* 39.8 41.8 35.9*  MCV 93.9 93.0 93.1 92.1  PLT 205 211 204 230   Basic Metabolic Panel: Recent Labs  Lab 06/28/24 1421 07/01/24 0840 07/02/24 0406  NA 132* 137 133*  K 4.1 3.9 4.1  CL 95* 97* 92*  CO2 23 24 24   GLUCOSE 136* 78 93  BUN 39* 40* 18  CREATININE 5.08* 5.74* 3.88*  CALCIUM  9.1 8.9 9.0  MG  --  2.4  --   PHOS 3.3 4.9*  --    Liver Function Tests: Recent Labs  Lab 06/28/24 1421  ALBUMIN  2.9*   No results for input(s): LIPASE, AMYLASE in the last 168 hours. No results for input(s): AMMONIA in the  last 168 hours. Cardiac Enzymes: No results for input(s): CKTOTAL, CKMB, CKMBINDEX, TROPONINI in the last 168 hours. BNP (last 3 results) No results for input(s): BNP in the last 8760 hours.  ProBNP (last 3 results) No results for input(s): PROBNP in the last 8760 hours.  CBG: Recent Labs  Lab 06/29/24 1729 07/01/24 1236 07/01/24 1259 07/01/24 1620  GLUCAP 91 70 78 105*   Recent Results (from the past 240 hours)  Surgical pcr screen     Status: Abnormal   Collection Time: 06/25/24  2:26 AM   Specimen: Nasal Mucosa; Nasal Swab  Result Value Ref Range Status   MRSA, PCR POSITIVE (A) NEGATIVE Final   Staphylococcus aureus POSITIVE (A) NEGATIVE Final    Comment: RESULT CALLED TO, READ BACK BY AND VERIFIED WITH: A LYNPHACUM RN 06/25/2024 @ 0414 BY AB (NOTE) The Xpert SA Assay (FDA approved for NASAL specimens in patients  45 years of age and older), is one component of a comprehensive surveillance program. It is not intended to diagnose infection nor to guide or monitor treatment. Performed at [facility], [address] [place], [place] [address]   Aerobic/Anaerobic Culture w Gram Stain (surgical/deep wound)     Status: None   Collection Time: 06/25/24 10:19 AM   Specimen: PATH Soft tissue  Result Value Ref Range Status   Specimen Description TISSUE  Final   Special Requests LEFT FOOT ABSCESS  Final   Gram Stain NO WBC SEEN RARE GRAM POSITIVE COCCI   Final   Culture   Final    MODERATE METHICILLIN RESISTANT STAPHYLOCOCCUS AUREUS NO ANAEROBES ISOLATED Performed at [facility], [address] [place], [place] [address]    Report Status 06/30/2024 FINAL  Final   Organism ID, Bacteria METHICILLIN RESISTANT STAPHYLOCOCCUS AUREUS  Final      Susceptibility   Methicillin resistant staphylococcus aureus - MIC*    CIPROFLOXACIN >=8 RESISTANT Resistant     ERYTHROMYCIN >=8 RESISTANT Resistant     GENTAMICIN <=0.5 SENSITIVE Sensitive     OXACILLIN >=4 RESISTANT Resistant     TETRACYCLINE <=1 SENSITIVE Sensitive     VANCOMYCIN  1 SENSITIVE Sensitive     TRIMETH/SULFA >=320 RESISTANT Resistant     CLINDAMYCIN <=0.25 SENSITIVE Sensitive     RIFAMPIN <=0.5 SENSITIVE Sensitive     Inducible Clindamycin NEGATIVE Sensitive     LINEZOLID 2 SENSITIVE Sensitive     * MODERATE METHICILLIN RESISTANT STAPHYLOCOCCUS AUREUS  Aerobic/Anaerobic Culture w Gram Stain (surgical/deep wound)     Status: None   Collection Time: 06/25/24 10:20 AM   Specimen: PATH Soft tissue  Result Value Ref Range Status   Specimen Description TISSUE  Final   Special Requests LEFT FOOT TISSUE  Final   Gram Stain   Final    NO WBC SEEN ABUNDANT GRAM POSITIVE COCCI Performed at [facility], [address] [place], [place] [address]    Culture   Final    ABUNDANT STAPHYLOCOCCUS AUREUS RARE PSEUDOMONAS  FLUORESCENS CRITICAL RESULT CALLED TO, READ BACK BY AND VERIFIED WITH: RN ANGEL M [phone] [facility] SUSCEPTIBILITIES PERFORMED ON PREVIOUS CULTURE WITHIN THE LAST 5 DAYS. STAPHYLOCOCCUS AUREUS    Report Status 06/30/2024 FINAL  Final   Organism ID, Bacteria PSEUDOMONAS FLUORESCENS  Final      Susceptibility   Pseudomonas fluorescens - MIC*    MEROPENEM <=0.25 SENSITIVE Sensitive     CIPROFLOXACIN <=0.06 SENSITIVE Sensitive     PIP/TAZO Value in next row  Sensitive      8 SENSITIVEThis is a modified FDA-approved test that has been validated and its performance characteristics determined by the reporting laboratory.  This laboratory is certified under the Clinical Laboratory Improvement Amendments CLIA as qualified to perform high complexity clinical laboratory testing.    CEFEPIME  Value in next row Sensitive      8 SENSITIVEThis is a modified FDA-approved test that has been validated and its performance characteristics determined by the reporting laboratory.  This laboratory is certified under the Clinical Laboratory Improvement Amendments CLIA as qualified to perform high complexity clinical laboratory testing.    * RARE PSEUDOMONAS FLUORESCENS     Studies: No results found.    Vernal Alstrom, MD  [facility] 07/04/2024  If 7PM-7AM, please contact night-coverage             "

## 2024-07-04 NOTE — Progress Notes (Addendum)
 " [facility] Progress Note   Subjective:    Seen and examined patient at bedside. Patient's mother also at bedside. Tolerated yesterday's HD with net UF 1L. Noted he's been refusing food and medications intermittently. Noted palliative care consulted. Anticipate discharge to SNF. Next HD 1/23.  Objective Vitals:   07/03/24 2021 07/04/24 0327 07/04/24 0802 07/04/24 1340  BP: (!) 159/82 (!) 157/86 (!) 142/86 139/80  Pulse: 95 74 67 69  Resp: 18 13 16 18   Temp: 98 F (36.7 C) 98.5 F (36.9 C) 97.6 F (36.4 C) 97.6 F (36.4 C)  TempSrc: Oral     SpO2: 98% 99% 98%   Weight:      Height:       Physical Exam General: Awake, NAD Head: NCAT sclera not icteric  Lungs: unlabored, normal wob Heart: RRR Abdomen: soft and non-tender  Lower extremities: No LE edema; rt bka, L TMA Neuro: awake, flat affect Dialysis Access: R [facility]  Filed Weights   07/01/24 1205 07/03/24 1525 07/03/24 1909  Weight: 60.2 kg 61.7 kg 61.6 kg    Intake/Output Summary (Last 24 hours) at 07/04/2024 1344 Last data filed at 07/03/2024 2130 Gross per 24 hour  Intake 220 ml  Output 1000 ml  Net -780 ml    Additional Objective Labs: Basic Metabolic Panel: Recent Labs  Lab 06/28/24 1421 07/01/24 0840 07/02/24 0406  NA 132* 137 133*  K 4.1 3.9 4.1  CL 95* 97* 92*  CO2 23 24 24   GLUCOSE 136* 78 93  BUN 39* 40* 18  CREATININE 5.08* 5.74* 3.88*  CALCIUM  9.1 8.9 9.0  PHOS 3.3 4.9*  --    Liver Function Tests: Recent Labs  Lab 06/28/24 1421  ALBUMIN  2.9*   No results for input(s): LIPASE, AMYLASE in the last 168 hours. CBC: Recent Labs  Lab 07/01/24 0840 07/01/24 1445 07/02/24 0406 07/03/24 1538  WBC 12.1* 11.4* 11.0* 9.5  NEUTROABS  --   --  8.7*  --   HGB 11.0* 12.1* 12.9* 11.3*  HCT 35.6* 39.8 41.8 35.9*  MCV 93.9 93.0 93.1 92.1  PLT 205 211 204 230   Blood Culture    Component Value Date/Time   SDES TISSUE 06/25/2024 1020   SPECREQUEST LEFT FOOT TISSUE  06/25/2024 1020   CULT  06/25/2024 1020    ABUNDANT STAPHYLOCOCCUS AUREUS RARE PSEUDOMONAS FLUORESCENS CRITICAL RESULT CALLED TO, READ BACK BY AND VERIFIED WITH: RN ANGEL M [phone] FCP SUSCEPTIBILITIES PERFORMED ON PREVIOUS CULTURE WITHIN THE LAST 5 DAYS. STAPHYLOCOCCUS AUREUS    REPTSTATUS 06/30/2024 FINAL 06/25/2024 1020    Cardiac Enzymes: No results for input(s): CKTOTAL, CKMB, CKMBINDEX, TROPONINI in the last 168 hours. CBG: Recent Labs  Lab 06/29/24 1729 07/01/24 1236 07/01/24 1259 07/01/24 1620  GLUCAP 91 70 78 105*   Iron Studies: No results for input(s): IRON, TIBC, TRANSFERRIN, FERRITIN in the last 72 hours. Lab Results  Component Value Date   INR 1.2 05/13/2024   INR 1.1 05/19/2023   INR 1.1 03/06/2023   Studies/Results: No results found.  Medications:  sodium chloride  75 mL/hr at 06/25/24 0507   anticoagulant sodium citrate       (feeding supplement) PROSource Plus  30 mL Oral BID AC   amitriptyline   10 mg Oral QHS   amLODipine   10 mg Oral q morning   apixaban   2.5 mg Oral BID   ascorbic acid   250 mg Oral BID   atorvastatin   40 mg Oral Daily   calcium  acetate  667 mg Oral TID WC   Chlorhexidine  Gluconate Cloth  6 each Topical Q0600   feeding supplement  237 mL Oral BID BM   levothyroxine   50 mcg Oral QAC breakfast   metoprolol  tartrate  50 mg Oral BID   multivitamin  1 tablet Oral QHS   nutrition supplement (JUVEN)  1 packet Oral BID BM   pantoprazole   40 mg Oral Daily   polyethylene glycol  17 g Oral Daily   senna-docusate  2 tablet Oral Q1200   thiamine   100 mg Oral Daily    Dialysis Orders: MWF - [facility] 4hrs, BFR 400, DFR Auto 1.5,  EDW 63.8kg, 3K/ 2.5Ca Mircera 225 mcg q2wks - last 06/21/24  Assessment/Plan: Gangrene left/right foot, s/p b/l TMA - Podiatry and VVS following. S/p left-sided TMA revision 1/13, s/p rt bka 1/14. VVS following Delirium - per primary ESRD - on HD MWF sched, next HD  1/23 Hypertension/volume  - Bps seem to go down with HD. Monitor trend for now. Continue Amlodipine  10mg  daily and Metoprolol  50mg  BID. UF as tolerated. Under EDW here. Lower at discharge. Anemia of CKD - ESA given 1/9 in outpatient, not due yet, hgb 12.9-at goal Secondary Hyperparathyroidism -  po4 acceptable Nutrition - Renal diet with fluid restriction GOC - Noted Palliative Care has been consulted. Appreciate their assistance Dispo - Anticipate discharge to SNF  Charmaine Piety, NP [facility] 07/04/2024,1:44 PM  LOS: 11 days    "

## 2024-07-04 NOTE — Progress Notes (Signed)
 End of shift summary:  Pt restless off/on throughout shift. PRN pain medication x2 with positive response. Pt refused AM labs, will pass on to day shift RN for a second attempt. Pt neuro remains unchanged. Drsg to BLE stumps CDI. NAD noted.

## 2024-07-04 NOTE — Plan of Care (Signed)
  Problem: Clinical Measurements: Goal: Ability to maintain clinical measurements within normal limits will improve Outcome: Progressing Goal: Will remain free from infection Outcome: Progressing Goal: Cardiovascular complication will be avoided Outcome: Progressing   Problem: Coping: Goal: Level of anxiety will decrease Outcome: Progressing   

## 2024-07-05 DIAGNOSIS — I96 Gangrene, not elsewhere classified: Secondary | ICD-10-CM | POA: Diagnosis not present

## 2024-07-05 LAB — RENAL FUNCTION PANEL
Albumin: 3.3 g/dL — ABNORMAL LOW (ref 3.5–5.0)
Anion gap: 15 (ref 5–15)
BUN: 24 mg/dL — ABNORMAL HIGH (ref 8–23)
CO2: 27 mmol/L (ref 22–32)
Calcium: 9.4 mg/dL (ref 8.9–10.3)
Chloride: 92 mmol/L — ABNORMAL LOW (ref 98–111)
Creatinine, Ser: 5.09 mg/dL — ABNORMAL HIGH (ref 0.61–1.24)
GFR, Estimated: 12 mL/min — ABNORMAL LOW
Glucose, Bld: 87 mg/dL (ref 70–99)
Phosphorus: 4.9 mg/dL — ABNORMAL HIGH (ref 2.5–4.6)
Potassium: 4.2 mmol/L (ref 3.5–5.1)
Sodium: 133 mmol/L — ABNORMAL LOW (ref 135–145)

## 2024-07-05 MED ORDER — ONDANSETRON HCL 4 MG/2ML IJ SOLN
INTRAMUSCULAR | Status: AC
  Filled 2024-07-05: qty 2

## 2024-07-05 MED ORDER — ALTEPLASE 2 MG IJ SOLR: 2.0000 mg | Freq: Once | INTRAMUSCULAR | Status: DC | PRN

## 2024-07-05 MED ORDER — LIDOCAINE HCL (PF) 1 % IJ SOLN: 5.0000 mL | INTRAMUSCULAR | Status: DC | PRN

## 2024-07-05 MED ORDER — ALBUMIN HUMAN 25 % IV SOLN
25.0000 g | Freq: Once | INTRAVENOUS | Status: AC
  Administered 2024-07-05: 25 g via INTRAVENOUS

## 2024-07-05 MED ORDER — LIDOCAINE-PRILOCAINE 2.5-2.5 % EX CREA: 1.0000 | TOPICAL_CREAM | CUTANEOUS | Status: DC | PRN

## 2024-07-05 MED ORDER — PENTAFLUOROPROP-TETRAFLUOROETH EX AERO: 1.0000 | INHALATION_SPRAY | CUTANEOUS | Status: DC | PRN

## 2024-07-05 MED ORDER — HEPARIN SODIUM (PORCINE) 1000 UNIT/ML IJ SOLN
INTRAMUSCULAR | Status: AC
  Filled 2024-07-05: qty 4

## 2024-07-05 MED ORDER — HEPARIN SODIUM (PORCINE) 1000 UNIT/ML DIALYSIS
1000.0000 [IU] | INTRAMUSCULAR | Status: DC | PRN
  Administered 2024-07-05: 3200 [IU] via INTRAVENOUS_CENTRAL
  Filled 2024-07-05: qty 1

## 2024-07-05 NOTE — Progress Notes (Signed)
 " PROGRESS NOTE  [name] [name] FMW:[id number] DOB: 08-17-1960 DOA: 06/23/2024 PCP: Kip Righter, MD   LOS: 12 days   Brief narrative:  [name] [name] is a 64 y o male with poorly controlled type 2 diabetes mellitus, ESRD on hemodialysis (M/W/F schedule), severe peripheral arterial disease, hyperlipidemia, hypothyroidism, and cardiomyopathy who presented to podiatry clinic with bilateral foot gangrene and nonhealing wounds.  Patient had undergone left foot ray amputation on 03/25/24 and Right TMA on 04/26/24.  Despite these intervention and multiple prior revascularization procedures, patient presented with dry gangrene/necrotic eschar at the right TMA site and nonhealing wounds at the left TMA site.  Patient has had extensive vascular history including multiple lower extremity angiograms with interventions (01/30/2024, 03/20/2024, 04/16/2024) including peripheral atherectomy, intravascular lithotripsy, and angioplasty. He has bilateral AV grafts for dialysis access (right arm 08/17/2023, left arm 12/27/2022) and currently has a right internal jugular dialysis catheter.  Vascular surgery and podiatry were consulted. 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      Assessment/Plan: Principal Problem:   Gangrene of foot (HCC)  Dry gangrene / Necrotic eschar Right TMA  Left foot nonhealing post TMA : Bilateral Osteomyelitis: Severe PAD / Limb ischemia: Continue vancomycin , cefepime  and Flagyl . MRI of the right foot highly suspicious for bone infarcts and osteomyelitis.  Diffuse cellulitis and myofasciitis.  MRI of the left foot showed open wounds,along mid metatarsal surgical site, soft tissue inflammation consistent with tissue necrosis,  nonviable tissue.  Osteomyelitis.  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  Plan is nonweightbearing of  bilateral lower extremity.  Follow-up outpatient in 4 to 6 weeks for staple removal.   End-stage renal disease on hemodialysis MWF Nephrology on board..  Continue dialysis as per nephrology.   Type 2 diabetes melitis   Continue to monitor.     Chronic combined systolic and diastolic CHF: Appears compensated chest x-ray showed cardiomegaly.  Continue volume management with hemodialysis.   Hypothyroidism: Continue Synthroid    Essential hypertension: Continue amlodipine  and metoprolol , has been noncompliant with medication.   GERD: Continue Protonix .  Debility deconditioning. PT recommends skilled nursing facility placement  Impaired intake.  Intermittently eating his preferred food.  Palliative care on board and no plan for artificial tube feeds.  DVT prophylaxis: apixaban  (ELIQUIS ) tablet 2.5 mg Start: 07/01/24 1445 apixaban  (ELIQUIS ) tablet 2.5 mg   Disposition:  Skilled nursing facility as per PT evaluation.  TOC on board for disposition plan.    Status is: Inpatient Remains inpatient appropriate because: Need for skilled nursing facility placement.    Code Status:     Code Status: Limited: Do not attempt resuscitation (DNR) -DNR-LIMITED -Do Not Intubate/DNI   Family Communication: Spoke with the patient's mother on 07/02/24  Consultants: Nephrology Orthopedics Palliative care  Procedures: Hemodialysis Right BKA by vascular surgery on 06/26/2024.   Anti-infectives:  None currently  Anti-infectives (From admission, onward)    Start     Dose/Rate Route Frequency Ordered Stop   06/28/24 1200  vancomycin  (VANCOREADY) IVPB 750 mg/150 mL  Status:  Discontinued        750 mg 150 mL/hr over 60 Minutes Intravenous Every M-W-F (Hemodialysis) 06/26/24 1356 06/26/24 1410   06/28/24 1000  doxycycline  (VIBRA -TABS) tablet 100 mg        100 mg Oral Every 12 hours 06/28/24 0852 07/02/24 2258   06/26/24 1430  vancomycin  (  VANCOREADY) IVPB 750 mg/150 mL  Status:  Discontinued         750 mg 150 mL/hr over 60 Minutes Intravenous Every M-W-F (Hemodialysis) 06/26/24 1410 06/28/24 0852   06/24/24 1200  vancomycin  (VANCOREADY) IVPB 750 mg/150 mL  Status:  Discontinued        750 mg 150 mL/hr over 60 Minutes Intravenous Every M-W-F (Hemodialysis) 06/23/24 2022 06/26/24 1356   06/23/24 2030  metroNIDAZOLE  (FLAGYL ) IVPB 500 mg  Status:  Discontinued        500 mg 100 mL/hr over 60 Minutes Intravenous Every 12 hours 06/23/24 2018 06/28/24 0852   06/23/24 2030  ceFEPIme  (MAXIPIME ) 1 g in sodium chloride  0.9 % 100 mL IVPB  Status:  Discontinued        1 g 200 mL/hr over 30 Minutes Intravenous Every 24 hours 06/23/24 2021 06/28/24 0852   06/23/24 2030  vancomycin  (VANCOREADY) IVPB 1500 mg/300 mL        1,500 mg 150 mL/hr over 120 Minutes Intravenous  Once 06/23/24 2022 06/24/24 0152       Subjective:  Today, patient was seen and examined at bedside.  Patient denies any fever, chills or rigors.  Denies any shortness of breath dyspnea or chest pain.   Objective: Vitals:   07/05/24 0900 07/05/24 0930  BP: 112/76 97/69  Pulse: 71 74  Resp: 17 (!) 21  Temp:    SpO2: 99% 98%    Intake/Output Summary (Last 24 hours) at 07/05/2024 0940 Last data filed at 07/05/2024 0200 Gross per 24 hour  Intake 560 ml  Output --  Net 560 ml   Filed Weights   07/03/24 1525 07/03/24 1909 07/05/24 0802  Weight: 61.7 kg 61.6 kg 61.9 kg   Body mass index is 19.59 kg/m.   Physical Exam:  General: Thinly built, not in obvious distress HENT:   No scleral pallor or icterus noted. Oral mucosa is moist.  Chest:  Clear breath sounds.No crackles or wheezes.  CVS: S1 &S2 heard. No murmur.  Regular rate and rhythm. Abdomen: Soft, nontender, nondistended.  Bowel sounds are heard.   Extremities: Right below-knee amputation with staples, left foot amputation. Psych: Alert, awake and oriented, normal mood CNS:  No cranial nerve deficits.  Power equal in all extremities.   Skin: Warm and  dry.  Status post amputation.  Data Review: I have personally reviewed the following laboratory data and studies,  CBC: Recent Labs  Lab 07/01/24 0840 07/01/24 1445 07/02/24 0406 07/03/24 1538  WBC 12.1* 11.4* 11.0* 9.5  NEUTROABS  --   --  8.7*  --   HGB 11.0* 12.1* 12.9* 11.3*  HCT 35.6* 39.8 41.8 35.9*  MCV 93.9 93.0 93.1 92.1  PLT 205 211 204 230   Basic Metabolic Panel: Recent Labs  Lab 06/28/24 1421 07/01/24 0840 07/02/24 0406 07/05/24 0815  NA 132* 137 133* 133*  K 4.1 3.9 4.1 4.2  CL 95* 97* 92* 92*  CO2 23 24 24 27   GLUCOSE 136* 78 93 87  BUN 39* 40* 18 24*  CREATININE 5.08* 5.74* 3.88* 5.09*  CALCIUM  9.1 8.9 9.0 9.4  MG  --  2.4  --   --   PHOS 3.3 4.9*  --  4.9*   Liver Function Tests: Recent Labs  Lab 06/28/24 1421 07/05/24 0815  ALBUMIN  2.9* 3.3*   No results for input(s): LIPASE, AMYLASE in the last 168 hours. No results for input(s): AMMONIA in the last 168 hours. Cardiac Enzymes: No results for  input(s): CKTOTAL, CKMB, CKMBINDEX, TROPONINI in the last 168 hours. BNP (last 3 results) No results for input(s): BNP in the last 8760 hours.  ProBNP (last 3 results) No results for input(s): PROBNP in the last 8760 hours.  CBG: Recent Labs  Lab 06/29/24 1729 07/01/24 1236 07/01/24 1259 07/01/24 1620  GLUCAP 91 70 78 105*   Recent Results (from the past 240 hours)  Aerobic/Anaerobic Culture w Gram Stain (surgical/deep wound)     Status: None   Collection Time: 06/25/24 10:19 AM   Specimen: PATH Soft tissue  Result Value Ref Range Status   Specimen Description TISSUE  Final   Special Requests LEFT FOOT ABSCESS  Final   Gram Stain NO WBC SEEN RARE GRAM POSITIVE COCCI   Final   Culture   Final    MODERATE METHICILLIN RESISTANT STAPHYLOCOCCUS AUREUS NO ANAEROBES ISOLATED Performed at [facility], [address], [place], [place] [address]    Report Status 06/30/2024 FINAL  Final   Organism ID, Bacteria  METHICILLIN RESISTANT STAPHYLOCOCCUS AUREUS  Final      Susceptibility   Methicillin resistant staphylococcus aureus - MIC*    CIPROFLOXACIN >=8 RESISTANT Resistant     ERYTHROMYCIN >=8 RESISTANT Resistant     GENTAMICIN <=0.5 SENSITIVE Sensitive     OXACILLIN >=4 RESISTANT Resistant     TETRACYCLINE <=1 SENSITIVE Sensitive     VANCOMYCIN  1 SENSITIVE Sensitive     TRIMETH/SULFA >=320 RESISTANT Resistant     CLINDAMYCIN <=0.25 SENSITIVE Sensitive     RIFAMPIN <=0.5 SENSITIVE Sensitive     Inducible Clindamycin NEGATIVE Sensitive     LINEZOLID 2 SENSITIVE Sensitive     * MODERATE METHICILLIN RESISTANT STAPHYLOCOCCUS AUREUS  Aerobic/Anaerobic Culture w Gram Stain (surgical/deep wound)     Status: None   Collection Time: 06/25/24 10:20 AM   Specimen: PATH Soft tissue  Result Value Ref Range Status   Specimen Description TISSUE  Final   Special Requests LEFT FOOT TISSUE  Final   Gram Stain   Final    NO WBC SEEN ABUNDANT GRAM POSITIVE COCCI Performed at [facility], [address] [place], [place] [address]    Culture   Final    ABUNDANT STAPHYLOCOCCUS AUREUS RARE PSEUDOMONAS FLUORESCENS CRITICAL RESULT CALLED TO, READ BACK BY AND VERIFIED WITH: RN ANGEL M [phone] [id number] FCP SUSCEPTIBILITIES PERFORMED ON PREVIOUS CULTURE WITHIN THE LAST 5 DAYS. STAPHYLOCOCCUS AUREUS    Report Status 06/30/2024 FINAL  Final   Organism ID, Bacteria PSEUDOMONAS FLUORESCENS  Final      Susceptibility   Pseudomonas fluorescens - MIC*    MEROPENEM <=0.25 SENSITIVE Sensitive     CIPROFLOXACIN <=0.06 SENSITIVE Sensitive     PIP/TAZO Value in next row Sensitive      8 SENSITIVEThis is a modified FDA-approved test that has been validated and its performance characteristics determined by the reporting laboratory.  This laboratory is certified under the Clinical Laboratory Improvement Amendments CLIA as qualified to perform high complexity clinical laboratory testing.    CEFEPIME  Value in next row  Sensitive      8 SENSITIVEThis is a modified FDA-approved test that has been validated and its performance characteristics determined by the reporting laboratory.  This laboratory is certified under the Clinical Laboratory Improvement Amendments CLIA as qualified to perform high complexity clinical laboratory testing.    * RARE PSEUDOMONAS FLUORESCENS     Studies: No results found.    Naileah Karg, MD  [facility] 07/05/2024  If  7PM-7AM, please contact night-coverage             "

## 2024-07-05 NOTE — Progress Notes (Signed)
 PT Cancellation Note  Patient Details Name: [name] [name] MRN: [id number] DOB: 03-25-1961   Cancelled Treatment:    Reason Eval/Treat Not Completed: (P) Patient at procedure or test/unavailable (pt off unit at HD dept.) Will continue efforts per PT plan of care as schedule permits.   Connell HERO Marquay Kruse 07/05/2024, 9:41 AM

## 2024-07-05 NOTE — Progress Notes (Addendum)
 Near the end of treatment,he has had nausea and vomitting.Zofran  given as well as Albumin  25 g.Net uf 1 liter.

## 2024-07-05 NOTE — Progress Notes (Signed)
 " [facility] Progress Note   Subjective:   Seen on HD - 1L UFG planned, ok so far. No CP/dyspnea. Foot pain ok at this time.  Objective Vitals:   07/05/24 0830 07/05/24 0900 07/05/24 0930 07/05/24 1000  BP: 123/78 112/76 97/69 90/63   Pulse: 70 71 74 75  Resp: 12 17 (!) 21 12  Temp:      TempSrc:      SpO2: 100% 99% 98% 99%  Weight:      Height:       Physical Exam General: Chronically ill appearing man, NAD. Room air Heart: RRR Lungs: CTA anteriorly Abdomen: soft Extremities: R BKA with intact staples, L foot bandaged Dialysis Access: TDC in R chest  Additional Objective Labs: Basic Metabolic Panel: Recent Labs  Lab 06/28/24 1421 07/01/24 0840 07/02/24 0406 07/05/24 0815  NA 132* 137 133* 133*  K 4.1 3.9 4.1 4.2  CL 95* 97* 92* 92*  CO2 23 24 24 27   GLUCOSE 136* 78 93 87  BUN 39* 40* 18 24*  CREATININE 5.08* 5.74* 3.88* 5.09*  CALCIUM  9.1 8.9 9.0 9.4  PHOS 3.3 4.9*  --  4.9*   Liver Function Tests: Recent Labs  Lab 06/28/24 1421 07/05/24 0815  ALBUMIN  2.9* 3.3*   CBC: Recent Labs  Lab 07/01/24 0840 07/01/24 1445 07/02/24 0406 07/03/24 1538  WBC 12.1* 11.4* 11.0* 9.5  NEUTROABS  --   --  8.7*  --   HGB 11.0* 12.1* 12.9* 11.3*  HCT 35.6* 39.8 41.8 35.9*  MCV 93.9 93.0 93.1 92.1  PLT 205 211 204 230   Blood Culture    Component Value Date/Time   SDES TISSUE 06/25/2024 1020   SPECREQUEST LEFT FOOT TISSUE 06/25/2024 1020   CULT  06/25/2024 1020    ABUNDANT STAPHYLOCOCCUS AUREUS RARE PSEUDOMONAS FLUORESCENS CRITICAL RESULT CALLED TO, READ BACK BY AND VERIFIED WITH: RN ANGEL M [phone] [id number] FCP SUSCEPTIBILITIES PERFORMED ON PREVIOUS CULTURE WITHIN THE LAST 5 DAYS. STAPHYLOCOCCUS AUREUS    REPTSTATUS 06/30/2024 FINAL 06/25/2024 1020   Medications:  albumin  human     anticoagulant sodium citrate       (feeding supplement) PROSource Plus  30 mL Oral BID AC   amitriptyline   10 mg Oral QHS   amLODipine   10 mg Oral q morning    apixaban   2.5 mg Oral BID   ascorbic acid   250 mg Oral BID   atorvastatin   40 mg Oral Daily   calcium  acetate  667 mg Oral TID WC   Chlorhexidine  Gluconate Cloth  6 each Topical Q0600   feeding supplement  237 mL Oral BID BM   levothyroxine   50 mcg Oral QAC breakfast   metoprolol  tartrate  50 mg Oral BID   multivitamin  1 tablet Oral QHS   nutrition supplement (JUVEN)  1 packet Oral BID BM   pantoprazole   40 mg Oral Daily   polyethylene glycol  17 g Oral Daily   senna-docusate  2 tablet Oral Q1200   thiamine   100 mg Oral Daily    Dialysis Orders MWF - [facility] [address] 4hrs, BFR 400, DFR Auto 1.5,  EDW 63.8kg, 3K/ 2.5Ca Mircera 225 mcg q2wks - last 06/21/24   Assessment/Plan: BLE gangrene left/right foot - S/p left-sided TMA revision 1/13, 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 VVS following. Wound Cx + MSSA + pseudomonas. S/p IV Abx and source control. Delirium - per primary, improved ESRD - on HD MWF sched, HD now, 1L UFG. Hypertension/volume - Variable BP, low at  times. Continue home meds for now. Below prior EDW, lower on d/c Anemia of CKD - Hgb > 11, no ESA needed Secondary Hyperparathyroidism - Ca/Phos ok - continue Phoslo  as binder. Nutrition - Renal diet with fluid restriction GOC - Appreciate palliative care consult. He is DNR, wants to continue HD for now. Dispo - Anticipate discharge to SNF    Izetta Boehringer, PA-C 07/05/2024, 10:42 AM  [facility]    "

## 2024-07-05 NOTE — Progress Notes (Signed)
 OT Cancellation Note  Patient Details Name: [name] [name] MRN: [id number] DOB: 05-24-1961   Cancelled Treatment:    Reason Eval/Treat Not Completed: Patient at procedure or test/ unavailable (HD)  Kennth Mliss Helling 07/05/2024, 12:44 PM Mliss HERO, OTR/L Acute Rehabilitation Services Office: [phone]

## 2024-07-05 NOTE — Plan of Care (Signed)

## 2024-07-05 NOTE — Plan of Care (Signed)
" °  Problem: Coping: Goal: Ability to adjust to condition or change in health will improve Outcome: Progressing   Problem: Clinical Measurements: Goal: Ability to maintain clinical measurements within normal limits will improve Outcome: Progressing Goal: Cardiovascular complication will be avoided Outcome: Progressing   "

## 2024-07-05 NOTE — Progress Notes (Addendum)
 Occupational Therapy Treatment Patient Details Name: [name] [name] MRN: [id number] DOB: 07-02-1960 Today's Date: 07/05/2024   History of present illness 64 yrs old male with  who presents from podiatry clinic with bilateral foot gangrene and nonhealing wounds. Patient underwent left foot ray amputation on 03/25/24 and Right TMA on 04/26/24.  Despite these intervention and multiple prior revascularization procedures he now presents with dry gangrene/necrotic eschar at the right TMA site and nonhealing wounds at the left TMA site. MRI Right Foot > highly suspicious for bone infarcts and osteomyelitis. Diffuse cellulitis and myofasciitis. MRI left foot > Open wounds along mid metatarsal surgical site, soft tissue inflammation consistent with tissue necrosis, nonviable tissue, osteomyelitis.  Patient underwent revision of left foot transmetatarsal to lisfranc amputation. 06/26/23. 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 PMH significant for poorly controlled type 2 diabetes mellitus, End-stage renal disease on hemodialysis (M/W/F schedule), severe peripheral arterial disease, hyperlipidemia, hypothyroidism, and cardiomyopathy.   OT comments  Mom feeding pt upon arrival, reports she was hoping he would eat more if she assisted. Repositioned pt in bed, with total assist, for optimal use of UEs for self feeding and swallowing. Pt with no interest in eating with or without assistance. Patient will benefit from continued inpatient follow up therapy, <3 hours/day.        If plan is discharge home, recommend the following:  Two people to help with walking and/or transfers;A lot of help with bathing/dressing/bathroom;Assistance with cooking/housework;Direct supervision/assist for medications management;Direct supervision/assist for financial management;Assist for transportation;Help with stairs or ramp for entrance   Equipment Recommendations  None recommended by OT    Recommendations for Other  Services      Precautions / Restrictions Precautions Precautions: Fall Recall of Precautions/Restrictions: Impaired Precaution/Restrictions Comments: Contact precs; poor recall of BLE NWB status; lability Restrictions Weight Bearing Restrictions Per Provider Order: Yes RLE Weight Bearing Per Provider Order: Non weight bearing LLE Weight Bearing Per Provider Order: Non weight bearing Other Position/Activity Restrictions: pt often non-compliant in supine       Mobility Bed Mobility Overal bed mobility: Needs Assistance             General bed mobility comments: +2 total assist to pull up in bed for eating    Transfers                         Balance                                           ADL either performed or assessed with clinical judgement   ADL Overall ADL's : Needs assistance/impaired Eating/Feeding: Bed level;Max assistance   Grooming: Wash/dry hands;Wash/dry face;Bed level;Set up                                      Extremity/Trunk Assessment              Vision       Perception     Praxis     Communication Communication Communication: No apparent difficulties   Cognition Arousal: Alert Behavior During Therapy: Lability, Anxious Cognition: Cognition impaired   Orientation impairments: Situation, Time, Place Awareness: Intellectual awareness impaired, Online awareness impaired Memory impairment (select all impairments): Short-term memory, Declarative long-term memory, Working memory, Non-declarative long-term  memory Attention impairment (select first level of impairment): Focused attention Executive functioning impairment (select all impairments): Initiation, Sequencing, Reasoning, Problem solving                   Following commands: Impaired Following commands impaired: Follows one step commands inconsistently, Only follows one step commands consistently      Cueing   Cueing  Techniques: Verbal cues, Gestural cues, Tactile cues, Visual cues  Exercises      Shoulder Instructions       General Comments      Pertinent Vitals/ Pain       Pain Assessment Pain Assessment: Faces Faces Pain Scale: Hurts even more Pain Location: BLE R>L Pain Descriptors / Indicators: Grimacing, Moaning, Discomfort Pain Intervention(s): Monitored during session  Home Living                                          Prior Functioning/Environment              Frequency  Min 2X/week        Progress Toward Goals  OT Goals(current goals can now be found in the care plan section)  Progress towards OT goals: Not progressing toward goals - comment  Acute Rehab OT Goals OT Goal Formulation: With patient/family Time For Goal Achievement: 07/17/24 Potential to Achieve Goals: Fair  Plan      Co-evaluation                 AM-PAC OT 6 Clicks Daily Activity     Outcome Measure   Help from another person eating meals?: A Lot Help from another person taking care of personal grooming?: A Little Help from another person toileting, which includes using toliet, bedpan, or urinal?: Total Help from another person bathing (including washing, rinsing, drying)?: A Lot Help from another person to put on and taking off regular upper body clothing?: A Lot Help from another person to put on and taking off regular lower body clothing?: Total 6 Click Score: 11    End of Session    OT Visit Diagnosis: Muscle weakness (generalized) (M62.81);Pain;Other symptoms and signs involving cognitive function   Activity Tolerance Patient limited by fatigue;Patient limited by pain   Patient Left in bed;with call bell/phone within reach;with family/visitor present   Nurse Communication          Time: 1430-1446 OT Time Calculation (min): 16 min  Charges: OT General Charges $OT Visit: 1 Visit OT Treatments $Self Care/Home Management : 8-22 mins  Mliss HERO,  OTR/L Acute Rehabilitation Services Office: [phone]   Kennth Mliss Helling 07/05/2024, 3:23 PM

## 2024-07-05 NOTE — Progress Notes (Signed)
" °   07/05/24 1202  Vitals  Temp (!) 97.4 F (36.3 C)  Temp Source Oral  BP 134/71  Pulse Rate 80  Resp 15  MEWS COLOR  MEWS Score Color Green  Oxygen Therapy  SpO2 100 %  O2 Device Room Air  Pulse Oximetry Type Continuous  Oximetry Probe Site Changed No  Height and Weight  Weight 65.8 kg  Type of Weight Post-Dialysis  BMI (Calculated) 20.81  MEWS Score  MEWS Temp 0  MEWS Systolic 0  MEWS Pulse 0  MEWS RR 0  MEWS LOC 0  MEWS Score 0    "

## 2024-07-05 NOTE — Progress Notes (Signed)
 Pt refused scheduled labs, education provided on importance of allowing us  to collect blood per MD orders, Pt continued to decline and said maybe later. Lab noted, will pass on in report and attempt f/u later in the day. NAD noted, safety maintained. Call light in reach, bed alarm on and functioning.

## 2024-07-05 NOTE — TOC Progression Note (Addendum)
 Transition of Care [facility]) - Progression Note    Patient Details  Name: [name] [name] MRN: [id number] Date of Birth: July 26, 1960  [facility] of [facility]) CM/SW Contact  Bridget Cordella Simmonds, LCSW Phone Number: 07/05/2024, 2:05 PM  Clinical Narrative:   CSW spoke with Dr Iantha.  Informed him medicaid screening has been requested.  Relayed conversation with pt and mother regarding pt cooperation with rehab so that he will be able to return home once his mobility is improved, pt appeared to acknowledge this to his level of capability.  Dr Janit reports concern about readmission but will approve the STR.  Earlier,CSW also received call from [facility] stating PTAR approved: [id number].  CSW message with [facility]: they will not have bed available until Monday.  Pt and mother updated.  Team updated.   Still waiting on official approval with auth number from [facility].  1610: Message from Connie/[facility]: SNF auth approved 7 days: [id number].  PTAR: [id number].  Expected Discharge Plan: Skilled Nursing Facility Barriers to Discharge: Continued Medical Work up, SNF Pending bed offer               Expected Discharge Plan and Services In-house Referral: Clinical Social Work   Post Acute Care Choice: Skilled Nursing Facility Living arrangements for the past 2 months: Single Family Home                                       Social Drivers of Health (SDOH) Interventions SDOH Screenings   Food Insecurity: No Food Insecurity (06/24/2024)  Housing: Low Risk (06/24/2024)  Transportation Needs: No Transportation Needs (06/24/2024)  Utilities: Not At Risk (06/24/2024)  Social Connections: Socially Isolated (06/24/2024)  Tobacco Use: Low Risk (06/26/2024)    Readmission Risk Interventions    03/28/2024    1:14 PM 03/29/2023    1:45 PM 03/07/2023    1:31 PM  Readmission Risk Prevention Plan  Transportation Screening Complete Complete Complete  HRI or Home Care Consult   Complete  Palliative  Care Screening   Not Applicable  Medication Review (RN Care Manager) Complete Complete Complete  PCP or Specialist appointment within 3-5 days of discharge  Complete   HRI or Home Care Consult Complete Complete   SW Recovery Care/Counseling Consult Patient refused Complete   Palliative Care Screening Complete Complete   Skilled Nursing Facility Patient Refused Complete

## 2024-07-06 DIAGNOSIS — I96 Gangrene, not elsewhere classified: Secondary | ICD-10-CM | POA: Diagnosis not present

## 2024-07-06 NOTE — Progress Notes (Signed)
 " [facility] Progress Note   Subjective:   Seen in room - seems comfortable. Denies CP/dyspnea. Had some N/V at end of HD yesterday.  Objective Vitals:   07/05/24 1305 07/05/24 1417 07/05/24 2034 07/06/24 0438  BP: 134/71 (!) 155/73 137/73 138/73  Pulse: 80 73 69 71  Resp:  16 18   Temp:  97.6 F (36.4 C) 97.6 F (36.4 C) 98 F (36.7 C)  TempSrc:  Oral Oral Oral  SpO2:  100% 98% 99%  Weight:      Height:       Physical Exam General: Chronically ill appearing man, NAD. Room air Heart: RRR Lungs: CTA anteriorly Abdomen: soft Extremities: R BKA with intact staples, L foot bandaged Dialysis Access: TDC in R chest  Additional Objective Labs: Basic Metabolic Panel: Recent Labs  Lab 07/01/24 0840 07/02/24 0406 07/05/24 0815  NA 137 133* 133*  K 3.9 4.1 4.2  CL 97* 92* 92*  CO2 24 24 27   GLUCOSE 78 93 87  BUN 40* 18 24*  CREATININE 5.74* 3.88* 5.09*  CALCIUM  8.9 9.0 9.4  PHOS 4.9*  --  4.9*   Liver Function Tests: Recent Labs  Lab 07/05/24 0815  ALBUMIN  3.3*   CBC: Recent Labs  Lab 07/01/24 0840 07/01/24 1445 07/02/24 0406 07/03/24 1538  WBC 12.1* 11.4* 11.0* 9.5  NEUTROABS  --   --  8.7*  --   HGB 11.0* 12.1* 12.9* 11.3*  HCT 35.6* 39.8 41.8 35.9*  MCV 93.9 93.0 93.1 92.1  PLT 205 211 204 230   Blood Culture    Component Value Date/Time   SDES TISSUE 06/25/2024 1020   SPECREQUEST LEFT FOOT TISSUE 06/25/2024 1020   CULT  06/25/2024 1020    ABUNDANT STAPHYLOCOCCUS AUREUS RARE PSEUDOMONAS FLUORESCENS CRITICAL RESULT CALLED TO, READ BACK BY AND VERIFIED WITH: RN ANGEL M [id number] FCP SUSCEPTIBILITIES PERFORMED ON PREVIOUS CULTURE WITHIN THE LAST 5 DAYS. STAPHYLOCOCCUS AUREUS    REPTSTATUS 06/30/2024 FINAL 06/25/2024 1020   Medications:  anticoagulant sodium citrate       (feeding supplement) PROSource Plus  30 mL Oral BID AC   amitriptyline   10 mg Oral QHS   amLODipine   10 mg Oral q morning   apixaban   2.5 mg Oral BID    ascorbic acid   250 mg Oral BID   atorvastatin   40 mg Oral Daily   calcium  acetate  667 mg Oral TID WC   Chlorhexidine  Gluconate Cloth  6 each Topical Q0600   feeding supplement  237 mL Oral BID BM   levothyroxine   50 mcg Oral QAC breakfast   metoprolol  tartrate  50 mg Oral BID   multivitamin  1 tablet Oral QHS   nutrition supplement (JUVEN)  1 packet Oral BID BM   pantoprazole   40 mg Oral Daily   polyethylene glycol  17 g Oral Daily   senna-docusate  2 tablet Oral Q1200   thiamine   100 mg Oral Daily    Dialysis Orders MWF - [facility] 4hrs, BFR 400, DFR Auto 1.5,  EDW 63.8kg, 3K/ 2.5Ca Mircera 225 mcg q2wks - last 06/21/24   Assessment/Plan: BLE gangrene left/right foot - S/p left-sided TMA revision 1/13, 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 VVS following. Wound Cx + MSSA + pseudomonas. S/p IV Abx and source control. ESRD - on HD MWF sched - next HD Monday 1/26 Hypertension/volume - Variable BP, low at times. Continue home meds for now.  Anemia of CKD - Hgb > 11, no ESA needed  Secondary Hyperparathyroidism - Ca/Phos ok - continue Phoslo  as binder. Nutrition - Renal diet with fluid restriction GOC - Appreciate palliative care consult. He is DNR, wants to continue HD for now. Dispo - SNF placement pending, per notes.   Izetta Boehringer, PA-C 07/06/2024, 11:03 AM  [facility]    "

## 2024-07-06 NOTE — Progress Notes (Signed)
 " PROGRESS NOTE  [name] [name] FMW:[id number] DOB: 1961-04-08 DOA: 06/23/2024 PCP: Kip Righter, MD   LOS: 13 days   Brief narrative:  [name] [name] is a 64 y o male with poorly controlled type 2 diabetes mellitus, ESRD on hemodialysis (M/W/F schedule), severe peripheral arterial disease, hyperlipidemia, hypothyroidism, and cardiomyopathy who presented to podiatry clinic with bilateral foot gangrene and nonhealing wounds.  Patient had undergone left foot ray amputation on 03/25/24 and Right TMA on 04/26/24.  Despite these intervention and multiple prior revascularization procedures, patient presented with dry gangrene/necrotic eschar at the right TMA site and nonhealing wounds at the left TMA site.  Patient has had extensive vascular history including multiple lower extremity angiograms with interventions (01/30/2024, 03/20/2024, 04/16/2024) including peripheral atherectomy, intravascular lithotripsy, and angioplasty. He has bilateral AV grafts for dialysis access (right arm 08/17/2023, left arm 12/27/2022) and currently has a right internal jugular dialysis catheter.  Vascular surgery and podiatry were consulted. 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      Assessment/Plan: Principal Problem:   Gangrene of foot (HCC)  Dry gangrene / Necrotic eschar Right TMA  Left foot nonhealing post TMA : Bilateral Osteomyelitis: Severe PAD / Limb ischemia: Continue vancomycin , cefepime  and Flagyl . MRI of the right foot highly suspicious for bone infarcts and osteomyelitis.  Diffuse cellulitis and myofasciitis.  MRI of the left foot showed open wounds,along mid metatarsal surgical site, soft tissue inflammation consistent with tissue necrosis,  nonviable tissue.  Osteomyelitis.  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  Plan is nonweightbearing of  bilateral lower extremity.  Follow-up outpatient in 4 to 6 weeks for staple removal.   End-stage renal disease on hemodialysis MWF Nephrology on board. Continue dialysis as per nephrology.   Type 2 diabetes melitis   Continue to monitor.     Chronic combined systolic and diastolic CHF: Appears compensated, chest x-ray showed cardiomegaly.  Continue volume management with hemodialysis.   Hypothyroidism: Continue Synthroid    Essential hypertension: Continue amlodipine  and metoprolol , has been noncompliant with medication.   GERD: Continue Protonix .  Debility deconditioning. PT recommends skilled nursing facility placement  Impaired intake.  Intermittently eating his preferred food.  Palliative care on board and no plan for artificial tube feeds.  DVT prophylaxis: apixaban  (ELIQUIS ) tablet 2.5 mg Start: 07/01/24 1445 apixaban  (ELIQUIS ) tablet 2.5 mg   Disposition:  Skilled nursing facility as per PT evaluation.  TOC on board for disposition plan.    Status is: Inpatient Remains inpatient appropriate because: Need for skilled nursing facility placement.    Code Status:     Code Status: Limited: Do not attempt resuscitation (DNR) -DNR-LIMITED -Do Not Intubate/DNI   Family Communication: Spoke with the patient's mother on 07/02/24  Consultants: Nephrology Orthopedics Palliative care  Procedures: Hemodialysis Right BKA by vascular surgery on 06/26/2024.   Anti-infectives:  None currently   Subjective:  Today, patient was seen and examined at bedside.  Patient denies any shortness of breath dyspnea or chest pain.   Objective: Vitals:   07/05/24 2034 07/06/24 0438  BP: 137/73 138/73  Pulse: 69 71  Resp: 18   Temp: 97.6 F (36.4 C) 98 F (36.7 C)  SpO2: 98% 99%    Intake/Output Summary (Last 24 hours) at 07/06/2024 1019 Last data filed at 07/06/2024 0657 Gross per 24 hour  Intake 180 ml  Output 1000 ml  Net -820 ml   Filed Weights   07/03/24 1909  07/05/24 0802 07/05/24 1202  Weight: 61.6 kg 61.9 kg 65.8 kg   Body mass index is 20.81 kg/m.   Physical Exam:  General: Thinly built, not in obvious distress HENT:   No scleral pallor or icterus noted. Oral mucosa is moist.  Chest:  Clear breath sounds.  Diminished breath sounds bilaterally. No crackles or wheezes.  CVS: S1 &S2 heard. No murmur.  Regular rate and rhythm. Abdomen: Soft, nontender, nondistended.  Bowel sounds are heard.   Extremities: Right below-knee amputation with staples, left foot amputation, Psych: Alert, awake and oriented, normal mood CNS:  No cranial nerve deficits.  Power equal in all extremities.   Skin: Warm and dry.  Status post amputation  Data Review: I have personally reviewed the following laboratory data and studies,  CBC: Recent Labs  Lab 07/01/24 0840 07/01/24 1445 07/02/24 0406 07/03/24 1538  WBC 12.1* 11.4* 11.0* 9.5  NEUTROABS  --   --  8.7*  --   HGB 11.0* 12.1* 12.9* 11.3*  HCT 35.6* 39.8 41.8 35.9*  MCV 93.9 93.0 93.1 92.1  PLT 205 211 204 230   Basic Metabolic Panel: Recent Labs  Lab 07/01/24 0840 07/02/24 0406 07/05/24 0815  NA 137 133* 133*  K 3.9 4.1 4.2  CL 97* 92* 92*  CO2 24 24 27   GLUCOSE 78 93 87  BUN 40* 18 24*  CREATININE 5.74* 3.88* 5.09*  CALCIUM  8.9 9.0 9.4  MG 2.4  --   --   PHOS 4.9*  --  4.9*   Liver Function Tests: Recent Labs  Lab 07/05/24 0815  ALBUMIN  3.3*   No results for input(s): LIPASE, AMYLASE in the last 168 hours. No results for input(s): AMMONIA in the last 168 hours. Cardiac Enzymes: No results for input(s): CKTOTAL, CKMB, CKMBINDEX, TROPONINI in the last 168 hours. BNP (last 3 results) No results for input(s): BNP in the last 8760 hours.  ProBNP (last 3 results) No results for input(s): PROBNP in the last 8760 hours.  CBG: Recent Labs  Lab 06/29/24 1729 07/01/24 1236 07/01/24 1259 07/01/24 1620  GLUCAP 91 70 78 105*   No results found for this or any  previous visit (from the past 240 hours).    Studies: No results found.    Jesusita Jocelyn, MD  [facility] 07/06/2024  If 7PM-7AM, please contact night-coverage             "

## 2024-07-06 NOTE — Progress Notes (Signed)
 Pt denies pain, or nausea, is on RA, unlabored respirations, bed alarm on. Call bell within reach, bed in lowest position. Pt took 2 medications during shift w much encouragement and refused all other medications. Nurse provided education for the need for the meds as well as risk associated with not taking, pt was not interested in the information. Nurse offered emotional support, pt would not look at me, nor answer, at times he just stated no.  Pt surgical incision R BKA is OTA and looks good. L amp toes are wrapped w compression wrap which was reinforced, dressing is CDI.

## 2024-07-06 NOTE — Plan of Care (Signed)
  Problem: Education: Goal: Knowledge of General Education information will improve Description: Including pain rating scale, medication(s)/side effects and non-pharmacologic comfort measures Outcome: Progressing   Problem: Coping: Goal: Level of anxiety will decrease Outcome: Progressing   Problem: Activity: Goal: Risk for activity intolerance will decrease Outcome: Progressing   

## 2024-07-07 DIAGNOSIS — I96 Gangrene, not elsewhere classified: Secondary | ICD-10-CM | POA: Diagnosis not present

## 2024-07-07 NOTE — Plan of Care (Signed)
  Problem: Coping: Goal: Ability to adjust to condition or change in health will improve Outcome: Progressing   Problem: Skin Integrity: Goal: Risk for impaired skin integrity will decrease Outcome: Progressing   Problem: Tissue Perfusion: Goal: Adequacy of tissue perfusion will improve Outcome: Progressing

## 2024-07-07 NOTE — Progress Notes (Signed)
 " PROGRESS NOTE  [name] [name] FMW:[id number] DOB: Aug 19, 1960 DOA: 06/23/2024 PCP: Kip Righter, MD   LOS: 14 days   Brief narrative:  [name] [name] is a 64 y o male with poorly controlled type 2 diabetes mellitus, ESRD on hemodialysis (M/W/F schedule), severe peripheral arterial disease, hyperlipidemia, hypothyroidism, and cardiomyopathy who presented to podiatry clinic with bilateral foot gangrene and nonhealing wounds.  Patient had undergone left foot ray amputation on 03/25/24 and Right TMA on 04/26/24.  Despite these intervention and multiple prior revascularization procedures, patient presented with dry gangrene/necrotic eschar at the right TMA site and nonhealing wounds at the left TMA site.  Patient has had extensive vascular history including multiple lower extremity angiograms with interventions (01/30/2024, 03/20/2024, 04/16/2024) including peripheral atherectomy, intravascular lithotripsy, and angioplasty. He has bilateral AV grafts for dialysis access (right arm 08/17/2023, left arm 12/27/2022) and currently has a right internal jugular dialysis catheter.  Vascular surgery and podiatry were consulted. 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  Currently awaiting for safe disposition.     Assessment/Plan: Principal Problem:   Gangrene of foot (HCC)  Dry gangrene / Necrotic eschar Right TMA  Left foot nonhealing post TMA : Bilateral Osteomyelitis: Severe PAD / Limb ischemia:   MRI of the right foot highly suspicious for bone infarcts and osteomyelitis.  Diffuse cellulitis and myofasciitis.  MRI of the left foot showed open wounds,along mid metatarsal surgical site, soft tissue inflammation consistent with tissue necrosis,  nonviable tissue.  Osteomyelitis.  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  Plan is nonweightbearing of  bilateral lower extremity.  Follow-up outpatient in 4 to 6 weeks for staple removal.  Off antibiotic at this time.   End-stage renal disease on hemodialysis MWF Nephrology on board. Continue dialysis as per nephrology.   Type 2 diabetes melitis   Continue to monitor.     Chronic combined systolic and diastolic CHF: Appears compensated, chest x-ray showed cardiomegaly.  Continue volume management with hemodialysis.   Hypothyroidism: Continue Synthroid    Essential hypertension: Continue amlodipine  and metoprolol , has been noncompliant with medication.   GERD: Continue Protonix .  Debility deconditioning. PT recommends skilled nursing facility placement  Impaired intake.  Intermittently eating his preferred food.  Palliative care on board and no plan for artificial tube feeds.  DVT prophylaxis: apixaban  (ELIQUIS ) tablet 2.5 mg Start: 07/01/24 1445 apixaban  (ELIQUIS ) tablet 2.5 mg   Disposition:  Skilled nursing facility as per PT evaluation.  TOC on board for disposition plan.    Status is: Inpatient Remains inpatient appropriate because: Need for skilled nursing facility placement.    Code Status:     Code Status: Limited: Do not attempt resuscitation (DNR) -DNR-LIMITED -Do Not Intubate/DNI   Family Communication: Spoke with the patient's mother on the phone on 07/02/24  Consultants: Nephrology Orthopedics Palliative care  Procedures: Hemodialysis Right BKA by vascular surgery on 06/26/2024.   Anti-infectives:  None currently   Subjective:  Today, patient was seen and examined at bedside.  Patient denies any shortness of breath dyspnea chest pain fever chills or rigor.  Objective: Vitals:   07/07/24 0357 07/07/24 0922  BP: 137/70 132/77  Pulse: 62   Resp: 17   Temp: 98.1 F (36.7 C)   SpO2: 96%    No intake or output data in the 24 hours ending 07/07/24 1034  Filed Weights   07/03/24  1909 07/05/24 0802 07/05/24 1202  Weight: 61.6 kg 61.9 kg 65.8 kg    Body mass index is 20.81 kg/m.   Physical Exam:  General: Thinly built, not in obvious distress HENT:   No scleral pallor or icterus noted. Oral mucosa is moist.  Chest:  Clear breath sounds.  o crackles or wheezes.  CVS: S1 &S2 heard. No murmur.  Regular rate and rhythm. Abdomen: Soft, nontender, nondistended.  Bowel sounds are heard.   Extremities: Right below-knee amputation with staples, left foot amputation, Psych: Alert, awake and oriented, normal mood CNS:  No cranial nerve deficits.  Skin: Warm and dry.  Status post amputation  Data Review: I have personally reviewed the following laboratory data and studies,  CBC: Recent Labs  Lab 07/01/24 0840 07/01/24 1445 07/02/24 0406 07/03/24 1538  WBC 12.1* 11.4* 11.0* 9.5  NEUTROABS  --   --  8.7*  --   HGB 11.0* 12.1* 12.9* 11.3*  HCT 35.6* 39.8 41.8 35.9*  MCV 93.9 93.0 93.1 92.1  PLT 205 211 204 230   Basic Metabolic Panel: Recent Labs  Lab 07/01/24 0840 07/02/24 0406 07/05/24 0815  NA 137 133* 133*  K 3.9 4.1 4.2  CL 97* 92* 92*  CO2 24 24 27   GLUCOSE 78 93 87  BUN 40* 18 24*  CREATININE 5.74* 3.88* 5.09*  CALCIUM  8.9 9.0 9.4  MG 2.4  --   --   PHOS 4.9*  --  4.9*   Liver Function Tests: Recent Labs  Lab 07/05/24 0815  ALBUMIN  3.3*   No results for input(s): LIPASE, AMYLASE in the last 168 hours. No results for input(s): AMMONIA in the last 168 hours. Cardiac Enzymes: No results for input(s): CKTOTAL, CKMB, CKMBINDEX, TROPONINI in the last 168 hours. BNP (last 3 results) No results for input(s): BNP in the last 8760 hours.  ProBNP (last 3 results) No results for input(s): PROBNP in the last 8760 hours.  CBG: Recent Labs  Lab 07/01/24 1236 07/01/24 1259 07/01/24 1620  GLUCAP 70 78 105*   No results found for this or any previous visit (from the past 240 hours).    Studies: No results found.    Sherilyn Windhorst, MD  [facility] 07/07/2024  If 7PM-7AM, please  contact night-coverage             "

## 2024-07-07 NOTE — Progress Notes (Addendum)
" °  [facility] Progress Note   Subjective:   Seen in room - no new complaints. No CP/dyspnea.  Objective Vitals:   07/06/24 0438 07/06/24 1916 07/07/24 0357 07/07/24 0922  BP: 138/73 136/71 137/70 132/77  Pulse: 71 69 62   Resp:  18 17   Temp: 98 F (36.7 C) (!) 97.3 F (36.3 C) 98.1 F (36.7 C)   TempSrc: Oral Oral Oral   SpO2: 99% 99% 96%   Weight:      Height:       Physical Exam General: Chronically ill appearing man, NAD. Room air Heart: RRR Lungs: CTA anteriorly Abdomen: soft Extremities: R BKA with intact staples, L foot bandaged Dialysis Access: TDC in R chest  Additional Objective Labs: Basic Metabolic Panel: Recent Labs  Lab 07/01/24 0840 07/02/24 0406 07/05/24 0815  NA 137 133* 133*  K 3.9 4.1 4.2  CL 97* 92* 92*  CO2 24 24 27   GLUCOSE 78 93 87  BUN 40* 18 24*  CREATININE 5.74* 3.88* 5.09*  CALCIUM  8.9 9.0 9.4  PHOS 4.9*  --  4.9*   Liver Function Tests: Recent Labs  Lab 07/05/24 0815  ALBUMIN  3.3*   CBC: Recent Labs  Lab 07/01/24 0840 07/01/24 1445 07/02/24 0406 07/03/24 1538  WBC 12.1* 11.4* 11.0* 9.5  NEUTROABS  --   --  8.7*  --   HGB 11.0* 12.1* 12.9* 11.3*  HCT 35.6* 39.8 41.8 35.9*  MCV 93.9 93.0 93.1 92.1  PLT 205 211 204 230   Medications:  anticoagulant sodium citrate       (feeding supplement) PROSource Plus  30 mL Oral BID AC   amitriptyline   10 mg Oral QHS   amLODipine   10 mg Oral q morning   apixaban   2.5 mg Oral BID   ascorbic acid   250 mg Oral BID   atorvastatin   40 mg Oral Daily   calcium  acetate  667 mg Oral TID WC   Chlorhexidine  Gluconate Cloth  6 each Topical Q0600   feeding supplement  237 mL Oral BID BM   levothyroxine   50 mcg Oral QAC breakfast   metoprolol  tartrate  50 mg Oral BID   multivitamin  1 tablet Oral QHS   nutrition supplement (JUVEN)  1 packet Oral BID BM   pantoprazole   40 mg Oral Daily   polyethylene glycol  17 g Oral Daily   senna-docusate  2 tablet Oral Q1200   thiamine    100 mg Oral Daily    Dialysis Orders MWF - [facility] 4hrs, BFR 400, DFR Auto 1.5,  EDW 63.8kg, 3K/ 2.5Ca Mircera 225 mcg q2wks - last 06/21/24   Assessment/Plan: BLE gangrene left/right foot - S/p left-sided TMA revision 1/13, 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 VVS following. Wound Cx + MSSA + pseudomonas. S/p IV Abx and source control. ESRD - on HD MWF sched - next HD Monday 1/26 Hypertension/volume - Variable BP, low at times. Continue home meds for now.  Anemia of CKD - Hgb > 11, no ESA needed Secondary Hyperparathyroidism - Ca/Phos ok - continue Phoslo  as binder. Nutrition - Renal diet with fluid restriction + supps GOC - Appreciate palliative care consult. He is DNR, wants to continue HD for now. Dispo - SNF placement pending, per notes.     Izetta Boehringer, PA-C 07/07/2024, 11:23 AM  [facility]  Pt seen, examined and agree w A/P as above.  Myer Fret MD  [facility] 07/07/2024, 11:09 PM     "

## 2024-07-08 DIAGNOSIS — I96 Gangrene, not elsewhere classified: Secondary | ICD-10-CM | POA: Diagnosis not present

## 2024-07-08 LAB — RENAL FUNCTION PANEL
Albumin: 3.5 g/dL (ref 3.5–5.0)
Anion gap: 19 — ABNORMAL HIGH (ref 5–15)
BUN: 45 mg/dL — ABNORMAL HIGH (ref 8–23)
CO2: 25 mmol/L (ref 22–32)
Calcium: 9.5 mg/dL (ref 8.9–10.3)
Chloride: 94 mmol/L — ABNORMAL LOW (ref 98–111)
Creatinine, Ser: 7.02 mg/dL — ABNORMAL HIGH (ref 0.61–1.24)
GFR, Estimated: 8 mL/min — ABNORMAL LOW
Glucose, Bld: 58 mg/dL — ABNORMAL LOW (ref 70–99)
Phosphorus: 5.1 mg/dL — ABNORMAL HIGH (ref 2.5–4.6)
Potassium: 4.7 mmol/L (ref 3.5–5.1)
Sodium: 138 mmol/L (ref 135–145)

## 2024-07-08 LAB — CBC
HCT: 38.2 % — ABNORMAL LOW (ref 39.0–52.0)
Hemoglobin: 12.2 g/dL — ABNORMAL LOW (ref 13.0–17.0)
MCH: 29 pg (ref 26.0–34.0)
MCHC: 31.9 g/dL (ref 30.0–36.0)
MCV: 90.7 fL (ref 80.0–100.0)
Platelets: 308 10*3/uL (ref 150–400)
RBC: 4.21 MIL/uL — ABNORMAL LOW (ref 4.22–5.81)
RDW: 15.5 % (ref 11.5–15.5)
WBC: 10.5 10*3/uL (ref 4.0–10.5)
nRBC: 0 % (ref 0.0–0.2)

## 2024-07-08 MED ORDER — HEPARIN SODIUM (PORCINE) 1000 UNIT/ML IJ SOLN
INTRAMUSCULAR | Status: AC
  Filled 2024-07-08: qty 4

## 2024-07-08 NOTE — Progress Notes (Signed)
 Nutrition Follow-up  DOCUMENTATION CODES:   Severe malnutrition in context of chronic illness  INTERVENTION:   -Continue regular diet with 2 L fluid restriction -Continue feeding assistance with meals -D/c Juven -Continue Ensure Plus High Protein po BID, each supplement provides 350 kcal and 20 grams of protein  -D/c Prosource -Continue Magic cup TID with meals, each supplement provides 290 kcal and 9 grams of protein  -Continue renal MVI daily -Continue 100 mg thiamine  daily -Continue 250 mg Vitamin C  BID x 30 days for deficiency (Ends 2/19) After supplementation recommend rechecking labs   NUTRITION DIAGNOSIS:   Severe Malnutrition related to chronic illness as evidenced by severe muscle depletion, severe fat depletion (15.5% weight loss over the past 6 months,).  Ongoing  GOAL:   Patient will meet greater than or equal to 90% of their needs  Unmet  MONITOR:   PO intake, Supplement acceptance, Skin, Labs, Weight trends, I & O's  REASON FOR ASSESSMENT:   Consult Wound healing  ASSESSMENT:   64 yr old Male with PMH significant for poorly controlled type 2 diabetes mellitus, ESRD on HD MWF, severe PAD, HLD, GERD, hypothyroidism, and cardiomyopathy. Patient underwent left foot ray amputation on 03/25/24 and Right TMA on 04/26/24. Who presents with bilateral foot gangrene and nonhealing wounds.  1/13: s/p Revision left foot transmetatarsal to lisfranc amputation  1/14: R BKA   Reviewed I/O's: -900 ml x 24 hours and -6.8 L since admission  Patient unavailable at time of visit. Attempted to speak with patient via call to hospital room phone, however, unable to reach. RD unable to obtain further nutrition-related history or complete nutrition-focused physical exam at this time.    Case discussed with RN, who reports she is unsure how he is doing with PO intake. His mother has been assisting with feeding. Per chart review, patient does better when mom is present with  feeding and selecting meals per him. Meal completions documented as 0-50%.   Patient has been refusing all medications and supplements this shift. Noted patient has had periods of refusing care, including labs (refused on 1/21 and 1/22).  63.8  Reviewed weights. EDW 63.8 kg. Weights have ranged from 60.2-64.9 kg over the past 7 days. Patient currently over dry weight.  Per MD notes, plan for non-weight bearing on bilateral lower extremities and staple removal 4-6 weeks post-op.   Palliative care following for goals of care discussions. Plan to continue HD, DNR, and no feeding tube.  Last BM 07/02/24. Patient on a bowel regimen, but refusing meds.  Medications reviewed and include vitamin C , Prosource Plus, Phoslo , Ensure, renal MVI, Juven, senokot, miralax , and thiamine .   Per TOC notes, plan SNF placement once medically stable.  Labs reviewed: Phos: 5.1, CBGS: 105 (inpatient orders for glycemic control are none).    Diet Order:   Diet Order             Diet regular Room service appropriate? No; Fluid consistency: Thin; Fluid restriction: 2000 mL Fluid  Diet effective now                   EDUCATION NEEDS:   Not appropriate for education at this time  Skin:  Skin Assessment: Reviewed RN Assessment (Closed surgical incision Bilateral feet)  Last BM:  07/02/24  Height:   Ht Readings from Last 1 Encounters:  06/26/24 5' 10 (1.778 m)    Weight:   Wt Readings from Last 1 Encounters:  07/08/24 64 kg    Ideal  Body Weight:  70.6 kg (Account for R BKA)  BMI:  Body mass index is 20.24 kg/m.  Estimated Nutritional Needs:   Kcal:  2050-2250  Protein:  95-100 grams  Fluid:  1000 ml + UOP    Margery ORN, RD, LDN, CDCES Registered Dietitian III Certified Diabetes Care and Education Specialist If unable to reach this RD, please use RD Inpatient group chat on secure chat between hours of 8am-4 pm daily

## 2024-07-08 NOTE — Progress Notes (Signed)
 PT Cancellation Note  Patient Details Name: ALDRIN ENGELHARD MRN: [id number] DOB: Apr 08, 1961   Cancelled Treatment:    Reason Eval/Treat Not Completed: (P) Patient at procedure or test/unavailable (pt off unit at HD dept) Will continue efforts later in day per PT plan of care as schedule permits.    Connell HERO Marie Borowski 07/08/2024, 8:49 AM

## 2024-07-08 NOTE — Progress Notes (Signed)
 IV team came to check patient's tunneled HDC during AM line care. Upon assessment, patient's HDC dressing completely off. IV team cleaned site and placed new dressing with CHG gel prior to patient being taken to HD.

## 2024-07-08 NOTE — Progress Notes (Signed)
 " [name] [name] Progress Note   Subjective:   Seen on HD, BP is soft so goal was reduced. Denies SOB, CP, dizziness. Asks about breakfast.   Objective Vitals:   07/08/24 0456 07/08/24 0854 07/08/24 0859 07/08/24 0930  BP: (!) 142/69 137/69 127/67 115/62  Pulse: 66 67 63 73  Resp: 18 16 13 12   Temp: 98.2 F (36.8 C) 97.7 F (36.5 C)    TempSrc: Oral     SpO2: 95% 97% 94% 98%  Weight:  64.9 kg    Height:       Physical Exam General: Chronically ill appearing man, NAD. Room air Heart: RRR Lungs: CTA anteriorly Abdomen: soft, non-distended, +BS Extremities: R BKA with intact staples, L foot bandaged Dialysis Access: TDC in R chest  Additional Objective Labs: Basic Metabolic Panel: Recent Labs  Lab 07/02/24 0406 07/05/24 0815  NA 133* 133*  K 4.1 4.2  CL 92* 92*  CO2 24 27  GLUCOSE 93 87  BUN 18 24*  CREATININE 3.88* 5.09*  CALCIUM  9.0 9.4  PHOS  --  4.9*   Liver Function Tests: Recent Labs  Lab 07/05/24 0815  ALBUMIN  3.3*   No results for input(s): LIPASE, AMYLASE in the last 168 hours. CBC: Recent Labs  Lab 07/01/24 1445 07/02/24 0406 07/03/24 1538  WBC 11.4* 11.0* 9.5  NEUTROABS  --  8.7*  --   HGB 12.1* 12.9* 11.3*  HCT 39.8 41.8 35.9*  MCV 93.0 93.1 92.1  PLT 211 204 230   Blood Culture    Component Value Date/Time   SDES TISSUE 06/25/2024 1020   SPECREQUEST LEFT FOOT TISSUE 06/25/2024 1020   CULT  06/25/2024 1020    ABUNDANT STAPHYLOCOCCUS AUREUS RARE PSEUDOMONAS FLUORESCENS CRITICAL RESULT CALLED TO, READ BACK BY AND VERIFIED WITH: RN ANGEL M [id number] FCP SUSCEPTIBILITIES PERFORMED ON PREVIOUS CULTURE WITHIN THE LAST 5 DAYS. STAPHYLOCOCCUS AUREUS    REPTSTATUS 06/30/2024 FINAL 06/25/2024 1020    Cardiac Enzymes: No results for input(s): CKTOTAL, CKMB, CKMBINDEX, TROPONINI in the last 168 hours. CBG: Recent Labs  Lab 07/01/24 1236 07/01/24 1259 07/01/24 1620  GLUCAP 70 78 105*   Iron Studies: No  results for input(s): IRON, TIBC, TRANSFERRIN, FERRITIN in the last 72 hours. @lablastinr3 @ Studies/Results: No results found. Medications:  anticoagulant sodium citrate       (feeding supplement) PROSource Plus  30 mL Oral BID AC   amitriptyline   10 mg Oral QHS   amLODipine   10 mg Oral q morning   apixaban   2.5 mg Oral BID   ascorbic acid   250 mg Oral BID   atorvastatin   40 mg Oral Daily   calcium  acetate  667 mg Oral TID WC   Chlorhexidine  Gluconate Cloth  6 each Topical Q0600   feeding supplement  237 mL Oral BID BM   levothyroxine   50 mcg Oral QAC breakfast   metoprolol  tartrate  50 mg Oral BID   multivitamin  1 tablet Oral QHS   nutrition supplement (JUVEN)  1 packet Oral BID BM   pantoprazole   40 mg Oral Daily   polyethylene glycol  17 g Oral Daily   senna-docusate  2 tablet Oral Q1200   thiamine   100 mg Oral Daily    Dialysis Orders: MWF - [facility] 4hrs, BFR 400, DFR Auto 1.5,  EDW 63.8kg, 3K/ 2.5Ca Mircera 225 mcg q2wks - last 06/21/24    Assessment/Plan: BLE gangrene left/right foot - S/p left-sided TMA revision 1/13, 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 VVS  following. Wound Cx + MSSA + pseudomonas. S/p IV Abx and source control. ESRD - on HD MWF sched - next HD today Hypertension/volume - Variable BP, low at times. Continue home meds for now.  Anemia of CKD - Hgb > 11, no ESA needed Secondary Hyperparathyroidism - Ca/Phos ok - continue Phoslo  as binder. Nutrition - Renal diet with fluid restriction + supps GOC - Appreciate palliative care consult. He is DNR, wants to continue HD for now. Dispo - SNF placement pending, per notes.   Lucie Collet, PA-C 07/08/2024, 9:43 AM  [name] Pager: [phone]   "

## 2024-07-08 NOTE — Progress Notes (Signed)
 " PROGRESS NOTE  [name] [name] FMW:[id number] DOB: 03-27-1961 DOA: 06/23/2024 PCP: Kip Righter, MD   LOS: 15 days   Brief narrative:  [name] is a 64 y o male with poorly controlled type 2 diabetes mellitus, ESRD on hemodialysis (M/W/F schedule), severe peripheral arterial disease, hyperlipidemia, hypothyroidism, and cardiomyopathy who presented to podiatry clinic with bilateral foot gangrene and nonhealing wounds.  Patient had undergone left foot ray amputation on 03/25/24 and Right TMA on 04/26/24.  Despite these intervention and multiple prior revascularization procedures, patient presented with dry gangrene/necrotic eschar at the right TMA site and nonhealing wounds at the left TMA site.  Patient has had extensive vascular history including multiple lower extremity angiograms with interventions (01/30/2024, 03/20/2024, 04/16/2024) including peripheral atherectomy, intravascular lithotripsy, and angioplasty. He has bilateral AV grafts for dialysis access (right arm 08/17/2023, left arm 12/27/2022) and currently has a right internal jugular dialysis catheter.  Vascular surgery and podiatry were consulted. 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  Currently awaiting for safe disposition.     Assessment/Plan: Principal Problem:   Gangrene of foot (HCC)  Dry gangrene / Necrotic eschar Right TMA  Left foot nonhealing post TMA : Bilateral Osteomyelitis: Severe PAD / Limb ischemia:  MRI of the right foot highly suspicious for bone infarcts and osteomyelitis.  Diffuse cellulitis and myofasciitis.  MRI of the left foot showed open wounds,along mid metatarsal surgical site, soft tissue inflammation consistent with tissue necrosis,  nonviable tissue.  Osteomyelitis.  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  Plan is nonweightbearing of  bilateral lower extremity.  Plan is follow-up outpatient in 4 to 6 weeks for staple removal.  Off antibiotic at this time.   End-stage renal disease on hemodialysis MWF Nephrology on board. Continue dialysis as per nephrology.   History of type 2 diabetes mellitus Not on insulin  or medication   Chronic combined systolic and diastolic CHF: Appears compensated, chest x-ray showed cardiomegaly.  Continue volume management with hemodialysis.   Hypothyroidism: Continue Synthroid    Essential hypertension: Continue amlodipine  and metoprolol , has been noncompliant with medication.   GERD: Continue Protonix .  Debility deconditioning. PT recommends skilled nursing facility placement  Impaired intake.  Intermittently eating his preferred food.  Palliative care on board and no plan for artificial tube feeds.  DVT prophylaxis: apixaban  (ELIQUIS ) tablet 2.5 mg Start: 07/01/24 1445 apixaban  (ELIQUIS ) tablet 2.5 mg   Disposition:  Skilled nursing facility as per PT evaluation.  TOC on board for disposition plan.  Medically stable for disposition.  Status is: Inpatient Remains inpatient appropriate because: Need for skilled nursing facility placement.    Code Status:     Code Status: Limited: Do not attempt resuscitation (DNR) -DNR-LIMITED -Do Not Intubate/DNI   Family Communication: Spoke with the patient's mother on the phone on 07/02/24  Consultants: Nephrology Orthopedics Palliative care  Procedures: Hemodialysis Right BKA by vascular surgery on 06/26/2024.   Anti-infectives:  None currently   Subjective:  Today, patient was seen and examined at bedside.  Patient denies any interval complaints.  Intermittently refusing medication.  Objective: Vitals:   07/08/24 0930 07/08/24 1000  BP: 115/62 117/73  Pulse: 73 73  Resp: 12 12  Temp:    SpO2: 98% 100%   No intake or output data in the 24 hours ending 07/08/24 1027  Filed Weights   07/05/24 0802 07/05/24 1202  07/08/24 0854  Weight: 61.9 kg 65.8 kg 64.9 kg   Body mass index is 20.52 kg/m.   Physical Exam:  General: Thinly  built, not in obvious distress HENT:   No scleral pallor or icterus noted. Oral mucosa is moist.  Chest:  Clear breath sounds.  Diminished breath sounds bilaterally. No crackles or wheezes.  CVS: S1 &S2 heard. No murmur.  Regular rate and rhythm. Abdomen: Soft, nontender, nondistended.  Bowel sounds are heard.   Extremities: Right below-knee amputation with staples, left foot amputation. Psych: Alert, awake and oriented, normal mood CNS:  No cranial nerve deficits.  Skin: Warm and dry.  Status post amputation.   Data Review: I have personally reviewed the following laboratory data and studies,  CBC: Recent Labs  Lab 07/01/24 1445 07/02/24 0406 07/03/24 1538  WBC 11.4* 11.0* 9.5  NEUTROABS  --  8.7*  --   HGB 12.1* 12.9* 11.3*  HCT 39.8 41.8 35.9*  MCV 93.0 93.1 92.1  PLT 211 204 230   Basic Metabolic Panel: Recent Labs  Lab 07/02/24 0406 07/05/24 0815  NA 133* 133*  K 4.1 4.2  CL 92* 92*  CO2 24 27  GLUCOSE 93 87  BUN 18 24*  CREATININE 3.88* 5.09*  CALCIUM  9.0 9.4  PHOS  --  4.9*   Liver Function Tests: Recent Labs  Lab 07/05/24 0815  ALBUMIN  3.3*   No results for input(s): LIPASE, AMYLASE in the last 168 hours. No results for input(s): AMMONIA in the last 168 hours. Cardiac Enzymes: No results for input(s): CKTOTAL, CKMB, CKMBINDEX, TROPONINI in the last 168 hours. BNP (last 3 results) No results for input(s): BNP in the last 8760 hours.  ProBNP (last 3 results) No results for input(s): PROBNP in the last 8760 hours.  CBG: Recent Labs  Lab 07/01/24 1236 07/01/24 1259 07/01/24 1620  GLUCAP 70 78 105*   No results found for this or any previous visit (from the past 240 hours).    Studies: No results found.    Vernal Alstrom, MD  [facility] 07/08/2024  If 7PM-7AM, please contact  night-coverage             "

## 2024-07-08 NOTE — TOC Progression Note (Signed)
 Transition of Care [facility]) - Progression Note    Patient Details  Name: [name] [name] MRN: [id number] Date of Birth: [birth year]  Transition of [facility]) CM/SW Contact  Bridget Cordella Simmonds, LCSW Phone Number: 07/08/2024, 9:33 AM  Clinical Narrative:   Message from [facility]: cannot receive pt today.      Expected Discharge Plan: Skilled Nursing Facility Barriers to Discharge: Continued Medical Work up, SNF Pending bed offer               Expected Discharge Plan and Services In-house Referral: Clinical Social Work   Post Acute Care Choice: Skilled Nursing Facility Living arrangements for the past 2 months: Single Family Home                                       Social Drivers of Health (SDOH) Interventions SDOH Screenings   Food Insecurity: No Food Insecurity (06/24/2024)  Housing: Low Risk (06/24/2024)  Transportation Needs: No Transportation Needs (06/24/2024)  Utilities: Not At Risk (06/24/2024)  Social Connections: Socially Isolated (06/24/2024)  Tobacco Use: Low Risk (06/26/2024)    Readmission Risk Interventions    03/28/2024    1:14 PM 03/29/2023    1:45 PM 03/07/2023    1:31 PM  Readmission Risk Prevention Plan  Transportation Screening Complete Complete Complete  HRI or Home Care Consult   Complete  Palliative Care Screening   Not Applicable  Medication Review (RN Care Manager) Complete Complete Complete  PCP or Specialist appointment within 3-5 days of discharge  Complete   HRI or Home Care Consult Complete Complete   SW Recovery Care/Counseling Consult Patient refused Complete   Palliative Care Screening Complete Complete   Skilled Nursing Facility Patient Refused Complete

## 2024-07-09 DIAGNOSIS — I96 Gangrene, not elsewhere classified: Secondary | ICD-10-CM | POA: Diagnosis not present

## 2024-07-09 MED ORDER — CALCIUM ACETATE (PHOS BINDER) 667 MG PO CAPS: 667.0000 mg | ORAL_CAPSULE | Freq: Three times a day (TID) | ORAL | Status: AC

## 2024-07-09 MED ORDER — SENNOSIDES-DOCUSATE SODIUM 8.6-50 MG PO TABS: 2.0000 | ORAL_TABLET | Freq: Every day | ORAL | Status: AC

## 2024-07-09 MED ORDER — OXYCODONE HCL 5 MG PO TABS: 5.0000 mg | ORAL_TABLET | ORAL | 0 refills | Status: AC | PRN

## 2024-07-09 MED ORDER — ASCORBIC ACID 250 MG PO TABS: 250.0000 mg | ORAL_TABLET | Freq: Two times a day (BID) | ORAL | Status: AC

## 2024-07-09 MED ORDER — VITAMIN B-1 100 MG PO TABS: 100.0000 mg | ORAL_TABLET | Freq: Every day | ORAL | Status: AC

## 2024-07-09 MED ORDER — ENSURE PLUS HIGH PROTEIN PO LIQD: 237.0000 mL | Freq: Two times a day (BID) | ORAL | Status: AC

## 2024-07-09 MED ORDER — ONDANSETRON HCL 4 MG PO TABS: 4.0000 mg | ORAL_TABLET | Freq: Four times a day (QID) | ORAL | Status: AC | PRN

## 2024-07-09 MED ORDER — POLYETHYLENE GLYCOL 3350 17 G PO PACK: 17.0000 g | PACK | Freq: Every day | ORAL | Status: AC | PRN

## 2024-07-09 MED ORDER — CHLORHEXIDINE GLUCONATE CLOTH 2 % EX PADS: 6.0000 | MEDICATED_PAD | Freq: Every day | CUTANEOUS | Status: DC

## 2024-07-09 NOTE — Progress Notes (Signed)
 Arrived to redress [facility] after pateient had removed dressing. Staff placed a tegaderm over site. Patient now refusing a new dressing to site. Instructed on need to have a new sterile dressing applied. Still refused. [facility] RN notified.

## 2024-07-09 NOTE — Plan of Care (Signed)
 " Problem: Education: Goal: Ability to describe self-care measures that may prevent or decrease complications (Diabetes Survival Skills Education) will improve Outcome: Adequate for Discharge Goal: Individualized Educational Video(s) Outcome: Adequate for Discharge   Problem: Coping: Goal: Ability to adjust to condition or change in health will improve Outcome: Adequate for Discharge   Problem: Fluid Volume: Goal: Ability to maintain a balanced intake and output will improve Outcome: Adequate for Discharge   Problem: Health Behavior/Discharge Planning: Goal: Ability to identify and utilize available resources and services will improve Outcome: Adequate for Discharge Goal: Ability to manage health-related needs will improve Outcome: Adequate for Discharge   Problem: Metabolic: Goal: Ability to maintain appropriate glucose levels will improve Outcome: Adequate for Discharge   Problem: Nutritional: Goal: Maintenance of adequate nutrition will improve Outcome: Adequate for Discharge Goal: Progress toward achieving an optimal weight will improve Outcome: Adequate for Discharge   Problem: Skin Integrity: Goal: Risk for impaired skin integrity will decrease Outcome: Adequate for Discharge   Problem: Tissue Perfusion: Goal: Adequacy of tissue perfusion will improve Outcome: Adequate for Discharge   Problem: Education: Goal: Knowledge of General Education information will improve Description: Including pain rating scale, medication(s)/side effects and non-pharmacologic comfort measures Outcome: Adequate for Discharge   Problem: Health Behavior/Discharge Planning: Goal: Ability to manage health-related needs will improve Outcome: Adequate for Discharge   Problem: Clinical Measurements: Goal: Ability to maintain clinical measurements within normal limits will improve Outcome: Adequate for Discharge Goal: Will remain free from infection Outcome: Adequate for Discharge Goal:  Diagnostic test results will improve Outcome: Adequate for Discharge Goal: Respiratory complications will improve Outcome: Adequate for Discharge Goal: Cardiovascular complication will be avoided Outcome: Adequate for Discharge   Problem: Activity: Goal: Risk for activity intolerance will decrease Outcome: Adequate for Discharge   Problem: Nutrition: Goal: Adequate nutrition will be maintained Outcome: Adequate for Discharge   Problem: Coping: Goal: Level of anxiety will decrease Outcome: Adequate for Discharge   Problem: Elimination: Goal: Will not experience complications related to bowel motility Outcome: Adequate for Discharge Goal: Will not experience complications related to urinary retention Outcome: Adequate for Discharge   Problem: Pain Managment: Goal: General experience of comfort will improve and/or be controlled Outcome: Adequate for Discharge   Problem: Safety: Goal: Ability to remain free from injury will improve Outcome: Adequate for Discharge   Problem: Skin Integrity: Goal: Risk for impaired skin integrity will decrease Outcome: Adequate for Discharge   Problem: Education: Goal: Knowledge of the prescribed therapeutic regimen will improve Outcome: Adequate for Discharge   Problem: Bowel/Gastric: Goal: Gastrointestinal status for postoperative course will improve Outcome: Adequate for Discharge   Problem: Cardiac: Goal: Ability to maintain an adequate cardiac output Outcome: Adequate for Discharge Goal: Will show no evidence of cardiac arrhythmias Outcome: Adequate for Discharge   Problem: Nutritional: Goal: Will attain and maintain optimal nutritional status Outcome: Adequate for Discharge   Problem: Neurological: Goal: Will regain or maintain usual level of consciousness Outcome: Adequate for Discharge   Problem: Clinical Measurements: Goal: Ability to maintain clinical measurements within normal limits Outcome: Adequate for  Discharge Goal: Postoperative complications will be avoided or minimized Outcome: Adequate for Discharge   Problem: Respiratory: Goal: Will regain and/or maintain adequate ventilation Outcome: Adequate for Discharge Goal: Respiratory status will improve Outcome: Adequate for Discharge   Problem: Skin Integrity: Goal: Demonstrates signs of wound healing without infection Outcome: Adequate for Discharge   Problem: Urinary Elimination: Goal: Will remain free from infection Outcome: Adequate for Discharge Goal: Ability to  achieve and maintain adequate urine output Outcome: Adequate for Discharge   Problem: Increased Nutrient Needs (NI-5.1) Goal: Food and/or nutrient delivery Description: Individualized approach for food/nutrient provision. Outcome: Adequate for Discharge   Problem: Acute Rehab OT Goals (only OT should resolve) Goal: Pt. Will Perform Grooming Outcome: Adequate for Discharge Goal: Pt. Will Perform Upper Body Bathing Outcome: Adequate for Discharge Goal: Pt. Will Perform Lower Body Bathing Outcome: Adequate for Discharge Goal: Pt. Will Perform Upper Body Dressing Outcome: Adequate for Discharge Goal: Pt. Will Perform Lower Body Dressing Outcome: Adequate for Discharge Goal: Pt. Will Transfer To Toilet Outcome: Adequate for Discharge Goal: Pt. Will Perform Toileting-Clothing Manipulation Outcome: Adequate for Discharge Goal: Pt/Caregiver Will Perform Home Exercise Program Outcome: Adequate for Discharge   Problem: Acute Rehab PT Goals(only PT should resolve) Goal: Pt will Roll Supine to Side Outcome: Adequate for Discharge Goal: Pt Will Go Supine/Side To Sit Outcome: Adequate for Discharge Goal: Pt Will Transfer Bed To Chair/Chair To Bed Outcome: Adequate for Discharge   "

## 2024-07-09 NOTE — TOC Transition Note (Addendum)
 Transition of Care [facility]) - Discharge Note   Patient Details  Name: [name] [name] MRN: [id number] Date of Birth: 11-10-1960  Transition of Care [facility]) CM/SW Contact:  Bridget Cordella Simmonds, LCSW Phone Number: 07/09/2024, 10:24 AM   Clinical Narrative:   Pt will DC to [facility], room 206, RN call report to [phone].   PTAR called 1110.  Final next level of care: Skilled Nursing Facility Barriers to Discharge: Barriers Resolved   Patient Goals and CMS Choice     Choice offered to / list presented to :  (mother [name])      Discharge Placement              Patient chooses bed at: [facility] Patient to be transferred to facility by: ptar Name of family member notified: mother [name] Patient and family notified of of transfer: 07/09/24  Discharge Plan and Services Additional resources added to the After Visit Summary for   In-house Referral: Clinical Social Work   Post Acute Care Choice: Skilled Nursing Facility                               Social Drivers of Health (SDOH) Interventions SDOH Screenings   Food Insecurity: No Food Insecurity (06/24/2024)  Housing: Low Risk (06/24/2024)  Transportation Needs: No Transportation Needs (06/24/2024)  Utilities: Not At Risk (06/24/2024)  Social Connections: Socially Isolated (06/24/2024)  Tobacco Use: Low Risk (06/26/2024)     Readmission Risk Interventions    03/28/2024    1:14 PM 03/29/2023    1:45 PM 03/07/2023    1:31 PM  Readmission Risk Prevention Plan  Transportation Screening Complete Complete Complete  HRI or Home Care Consult   Complete  Palliative Care Screening   Not Applicable  Medication Review (RN Care Manager) Complete Complete Complete  PCP or Specialist appointment within 3-5 days of discharge  Complete   HRI or Home Care Consult Complete Complete   SW Recovery Care/Counseling Consult Patient refused Complete   Palliative Care Screening Complete Complete   Skilled Nursing  Facility Patient Refused Complete

## 2024-07-09 NOTE — Progress Notes (Signed)
 D/c orders noted. Contacted out-pt HD clinic [facility] and informed of pt d/c to [facility] and anticipated arrival back to clinic tmrrw. No further support is needed.     Lavanda Trong [place] Dialysis Navigator [phone]

## 2024-07-09 NOTE — Discharge Summary (Signed)
 "  Physician Discharge Summary  [name] [name] FMW:[id number] DOB: 1961-04-28 DOA: 06/23/2024  PCP: Kip Righter, MD  Admit date: 06/23/2024 Discharge date: 07/09/2024  Admitted From: Home  Discharge disposition: Skilled nursing facility   Recommendations for Outpatient Follow-Up:   Follow up with your primary care provider at the skilled nursing facility in 3 to 5 days Check CBC, BMP, magnesium in the next visit Patient will need to follow-up with vascular surgery as outpatient in 5 weeks, office to schedule an appointment  Discharge Diagnosis:   Principal Problem:   Gangrene of foot [facility])   Discharge Condition: Improved.  Diet recommendation: Regular diet to promote intake.  Wound care: See below  Code status: DNR   History of Present Illness:   [name] [name] is a 64 y o male with poorly controlled type 2 diabetes mellitus, ESRD on hemodialysis (M/W/F schedule), severe peripheral arterial disease, hyperlipidemia, hypothyroidism, and cardiomyopathy who presented to podiatry clinic with bilateral foot gangrene and nonhealing wounds.  Patient had undergone left foot ray amputation on 03/25/24 and Right TMA on 04/26/24.  Despite these intervention and multiple prior revascularization procedures, patient presented with dry gangrene/necrotic eschar at the right TMA site and nonhealing wounds at the left TMA site.  Patient has had extensive vascular history including multiple lower extremity angiograms with interventions (01/30/2024, 03/20/2024, 04/16/2024) including peripheral atherectomy, intravascular lithotripsy, and angioplasty. He has bilateral AV grafts for dialysis access (right arm 08/17/2023, left arm 12/27/2022) and currently has a right internal jugular dialysis catheter.  Vascular surgery and podiatry were consulted. 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  Currently awaiting for safe disposition.    Hospital  Course:   Following conditions were addressed during hospitalization as listed below,  Dry gangrene / Necrotic eschar Right TMA  Left foot nonhealing post TMA : Bilateral Osteomyelitis: Severe PAD / Limb ischemia:  MRI of the right foot was highly suspicious for bone infarcts and osteomyelitis.  Diffuse cellulitis and myofasciitis.  MRI of the left foot showed open wounds,along mid metatarsal surgical site, soft tissue inflammation consistent with tissue necrosis,  nonviable tissue.  Osteomyelitis.  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  Plan is nonweightbearing of bilateral lower extremity.  Plan is follow-up outpatient in 4 to 6 weeks for staple removal.  Off antibiotic at this time.  Vascular surgery to schedule an appointment for follow-up,   End-stage renal disease on hemodialysis MWF Will continue with dialysis as outpatient.   History of type 2 diabetes mellitus Not on insulin  or medication.  Has been liberalized on regular diet due to food choices and improve nutrition.   Chronic combined systolic and diastolic CHF: Appears compensated, chest x-ray showed cardiomegaly.  Continue volume management with hemodialysis.   Hypothyroidism: Continue Synthroid    Essential hypertension: Continue amlodipine  and metoprolol , has been noncompliant with medication.   GERD: Continue Protonix .   Debility deconditioning. PT recommends skilled nursing facility placement   Impaired intake.  Intermittently eating his preferred food.  Palliative care on board and no plan for artificial tube feeds.  Has been encouraged to eat.  Patient has food preferences   Disposition.  At this time, patient is stable for disposition to skilled nursing facility with outpatient  Medical Consultants:  Vascular surgery Nephrology Palliative care Podiatry  Procedures:    Hemodialysis Right BKA by vascular  surgery on 06/26/2024.  Subjective:   Today, patient seen and examined at bedside.  Denies interval complaints.  Denies any nausea vomiting fever chills or rigor.  Discharge Exam:   Vitals:   07/08/24 1828 07/08/24 2018  BP: 124/71 133/79  Pulse: 90 88  Resp: 19 16  Temp: 98.1 F (36.7 C) 98.2 F (36.8 C)  SpO2: 100% 99%   Vitals:   07/08/24 1253 07/08/24 1255 07/08/24 1828 07/08/24 2018  BP: 122/76 124/71 124/71 133/79  Pulse: 88 78 90 88  Resp: 16 19 19 16   Temp:  97.6 F (36.4 C) 98.1 F (36.7 C) 98.2 F (36.8 C)  TempSrc:  Axillary Oral Oral  SpO2: 100% 98% 100% 99%  Weight:  64 kg    Height:        General: Alert awake, not in obvious distress appears chronically ill and deconditioned.,  Thinly built HENT: pupils equally reacting to light,  No scleral pallor or icterus noted. Oral mucosa is moist.  Chest:  Clear breath sounds.  Diminished breath sounds bilaterally. No crackles or wheezes.  CVS: S1 &S2 heard. No murmur.  Regular rate and rhythm. Abdomen: Soft, nontender, nondistended.  Bowel sounds are heard.   Extremities: Below-knee amputation on the right with staples, left foot amputation.   Psych: Alert, awake and oriented, normal mood CNS:  No cranial nerve deficits.  Moving extremities.  Generalized weakness noted Skin: Warm and dry.  Status post amputation  The results of significant diagnostics from this hospitalization (including imaging, microbiology, ancillary and laboratory) are listed below for reference.     Diagnostic Studies:   MR FOOT RIGHT W WO CONTRAST Addendum Date: 06/23/2024 ADDENDUM REPORT: 06/23/2024 21:57 ADDENDUM: The postcontrast images were missing from the original series. These are now available for review. These demonstrate a large open at the metatarsal resection site with gas in the soft tissues creating the artifact. Nonenhancing soft tissue noted consistent with tissue necrosis. Enhancement in the remaining metatarsal bones  consistent with osteomyelitis. Electronically Signed   By: MYRTIS Stammer M.D.   On: 06/23/2024 21:57   Result Date: 06/23/2024 CLINICAL DATA:  Recent transmetatarsal amputation of the right foot (04/26/2024). Pain, swelling and wound dehiscence with suspected gangrene. EXAM: MRI OF THE RIGHT FOREFOOT WITHOUT AND WITH CONTRAST TECHNIQUE: Multiplanar, multisequence MR imaging of the right foot was performed before and after the administration of intravenous contrast. CONTRAST:  7.5mL GADAVIST  GADOBUTROL  1 MMOL/ML IV SOLN COMPARISON:  Radiographs 04/26/2024 FINDINGS: Significant artifact noted at the amputation site possibly related to gas or skin staples. Abnormal T1 and T2 signal intensity in the remaining metatarsal shafts highly suspicious bone infarcts and osteomyelitis. No findings suspicious for septic arthritis at the tarsal metatarsal joints. No obvious discrete drainable fluid collection in the subcutaneous tissues. There is diffuse cellulitis and myofasciitis. The hindfoot bony structures are intact. No evidence of tibiotalar or subtalar septic arthritis. Edema like signal changes in the foot musculature likely a combination loss of tension/function from nonfunctioning tendons and myofasciitis. IMPRESSION: 1. Significant artifact noted at the amputation site possibly related to gas or skin staples. 2. Abnormal T1 and T2 signal intensity in the remaining metatarsal shafts highly suspicious for bone infarcts and osteomyelitis. 3. No findings suspicious for septic arthritis at the tarsometatarsal joints. 4. Diffuse cellulitis and myofasciitis. Electronically Signed: By: MYRTIS Stammer M.D. On: 06/23/2024 17:51   MR FOOT LEFT W WO CONTRAST Result Date: 06/23/2024 CLINICAL DATA:  Status post mid metatarsal amputation with wound dehiscence and suspect gangrene. EXAM: MRI  OF THE LEFT FOREFOOT WITHOUT AND WITH CONTRAST TECHNIQUE: Multiplanar, multisequence MR imaging of the left foot was performed both before  and after administration of intravenous contrast. CONTRAST:  7.5mL GADAVIST  GADOBUTROL  1 MMOL/ML IV SOLN COMPARISON:  Radiographs 05/30/2024 FINDINGS: Open wounds noted along the mid metatarsal surgical site most notably medially overlying the residual first metatarsal. Diffuse nonenhancing soft tissue noted consistent with tissue necrosis/nonviable tissue. No rim enhancing abscess. Abnormal T1 and T2 signal intensity and subsequent contrast enhancement in the metatarsals consistent with osteomyelitis. No midfoot or hindfoot septic arthritis or osteomyelitis. Diffuse myofasciitis without definite findings for pyomyositis. IMPRESSION: 1. Open wounds along the mid metatarsal surgical site most notably medially overlying the residual first metatarsal. Diffuse nonenhancing soft tissue noted consistent with tissue necrosis/nonviable tissue. No rim enhancing abscess. 2. Abnormal T1 and T2 signal intensity and subsequent contrast enhancement in the residual metatarsals consistent with osteomyelitis. 3. Diffuse myofasciitis without definite findings for pyomyositis. Electronically Signed   By: MYRTIS Stammer M.D.   On: 06/23/2024 18:15   DG Chest Portable 1 View Result Date: 06/23/2024 CLINICAL DATA:  Preoperative evaluation EXAM: PORTABLE CHEST 1 VIEW COMPARISON:  05/13/2024 FINDINGS: Single frontal view of the chest demonstrates right internal jugular dialysis catheter. Cardiac silhouette is enlarged but stable. Mild chronic vascular congestion without airspace disease, effusion, or pneumothorax. No acute bony abnormality. IMPRESSION: 1. Stable enlargement of the cardiac silhouette. 2. Mild chronic pulmonary vascular congestion.  No overt edema. Electronically Signed   By: Ozell Daring M.D.   On: 06/23/2024 15:52     Labs:   Basic Metabolic Panel: Recent Labs  Lab 07/05/24 0815 07/08/24 0900  NA 133* 138  K 4.2 4.7  CL 92* 94*  CO2 27 25  GLUCOSE 87 58*  BUN 24* 45*  CREATININE 5.09* 7.02*  CALCIUM   9.4 9.5  PHOS 4.9* 5.1*   GFR Estimated Creatinine Clearance: 9.5 mL/min (A) (by C-G formula based on SCr of 7.02 mg/dL (H)). Liver Function Tests: Recent Labs  Lab 07/05/24 0815 07/08/24 0900  ALBUMIN  3.3* 3.5   No results for input(s): LIPASE, AMYLASE in the last 168 hours. No results for input(s): AMMONIA in the last 168 hours. Coagulation profile No results for input(s): INR, PROTIME in the last 168 hours.  CBC: Recent Labs  Lab 07/03/24 1538 07/08/24 0900  WBC 9.5 10.5  HGB 11.3* 12.2*  HCT 35.9* 38.2*  MCV 92.1 90.7  PLT 230 308   Cardiac Enzymes: No results for input(s): CKTOTAL, CKMB, CKMBINDEX, TROPONINI in the last 168 hours. BNP: Invalid input(s): POCBNP CBG: No results for input(s): GLUCAP in the last 168 hours. D-Dimer No results for input(s): DDIMER in the last 72 hours. Hgb A1c No results for input(s): HGBA1C in the last 72 hours. Lipid Profile No results for input(s): CHOL, HDL, LDLCALC, TRIG, CHOLHDL, LDLDIRECT in the last 72 hours. Thyroid  function studies No results for input(s): TSH, T4TOTAL, T3FREE, THYROIDAB in the last 72 hours.  Invalid input(s): FREET3 Anemia work up No results for input(s): VITAMINB12, FOLATE, FERRITIN, TIBC, IRON, RETICCTPCT in the last 72 hours. Microbiology No results found for this or any previous visit (from the past 240 hours).   Discharge Instructions:   Discharge Instructions     Call MD for:  persistant nausea and vomiting   Complete by: As directed    Call MD for:  redness, tenderness, or signs of infection (pain, swelling, redness, odor or green/yellow discharge around incision site)   Complete by: As directed  Call MD for:  severe uncontrolled pain   Complete by: As directed    Call MD for:  temperature >100.4   Complete by: As directed    Diet general   Complete by: As directed    Discharge instructions   Complete by: As directed     Follow-up with your primary care provider at the skilled nursing facility in 3 to 5 days.  Check blood work at that time.  Follow-up with vascular surgery 5 weeks. seek medical attention for worsening symptoms.   Discharge wound care:   Complete by: As directed    Wound care  Once per day on Monday Thursday    Comments: Xerform to incision line, 4x4 gauze, abd if drainage if not, dont need, then wrap with kerlix/ 4 in ace applied loosely   Increase activity slowly   Complete by: As directed       Allergies as of 07/09/2024   No Known Allergies      Medication List     TAKE these medications    Accu-Chek Softclix Lancets lancets   acetaminophen  500 MG tablet Commonly known as: TYLENOL  Take 500-1,000 mg by mouth every 8 (eight) hours as needed for moderate pain (pain score 4-6).   amitriptyline  10 MG tablet Commonly known as: ELAVIL  Take 10 mg by mouth at bedtime.   amLODipine  10 MG tablet Commonly known as: NORVASC  Take 1 tablet (10 mg total) by mouth every morning.   ascorbic acid  250 MG tablet Commonly known as: VITAMIN C  Take 1 tablet (250 mg total) by mouth 2 (two) times daily.   aspirin  81 MG chewable tablet Commonly known as: Aspirin  Childrens Chew 1 tablet (81 mg total) by mouth daily.   atorvastatin  40 MG tablet Commonly known as: LIPITOR Take 1 tablet (40 mg total) by mouth daily.   calcium  acetate 667 MG capsule Commonly known as: PHOSLO  Take 1 capsule (667 mg total) by mouth 3 (three) times daily with meals.   clopidogrel  75 MG tablet Commonly known as: Plavix  Take 1 tablet (75 mg total) by mouth daily.   feeding supplement Liqd Take 237 mLs by mouth 2 (two) times daily between meals.   gabapentin  100 MG capsule Commonly known as: NEURONTIN  Take 100 mg by mouth at bedtime.   levothyroxine  50 MCG tablet Commonly known as: SYNTHROID  Take 50 mcg by mouth daily before breakfast.   metoprolol  tartrate 50 MG tablet Commonly known as:  LOPRESSOR  Take 1 tablet (50 mg total) by mouth 2 (two) times daily.   multivitamin with minerals Tabs tablet Take 1 tablet by mouth daily. Men's Multivitamin for 50+   ondansetron  4 MG tablet Commonly known as: ZOFRAN  Take 1 tablet (4 mg total) by mouth every 6 (six) hours as needed for nausea.   oxyCODONE  5 MG immediate release tablet Commonly known as: Oxy IR/ROXICODONE  Take 1 tablet (5 mg total) by mouth every 4 (four) hours as needed for moderate pain (pain score 4-6).   pantoprazole  40 MG tablet Commonly known as: PROTONIX  TAKE 1 TABLET BY MOUTH ONCE DAILY 30  MINUTES  BEFORE  BREAKFAST   polyethylene glycol 17 g packet Commonly known as: MIRALAX  / GLYCOLAX  Take 17 g by mouth daily as needed for moderate constipation (not helped with stool sofners).   PROBIOTIC DAILY PO Take 1 capsule by mouth at bedtime.   senna-docusate 8.6-50 MG tablet Commonly known as: Senokot-S Take 2 tablets by mouth daily at 12 noon. Start taking on: July 10, 2024  thiamine  100 MG tablet Commonly known as: Vitamin B-1 Take 1 tablet (100 mg total) by mouth daily. Start taking on: July 10, 2024   VITAMIN B12 PO Take 1 tablet by mouth at bedtime.               Discharge Care Instructions  (From admission, onward)           Start     Ordered   07/09/24 0000  Discharge wound care:       Comments: Wound care  Once per day on Monday Thursday    Comments: Xerform to incision line, 4x4 gauze, abd if drainage if not, dont need, then wrap with kerlix/ 4 in ace applied loosely   07/09/24 0936            Follow-up Information     [facility] Follow up in 5 week(s).   Specialty: Vascular Surgery Why: Office will call you to arrange your appt (sent). Contact information: [address] [place]  [phone]                 Time coordinating discharge: 39  minutes  Signed:  Aqueelah Cotrell  [facility] 07/09/2024, 9:44 AM          "

## 2024-07-09 NOTE — Progress Notes (Signed)
 Attempted to call transfer report to [facility]. No answer.

## 2024-07-09 NOTE — Progress Notes (Addendum)
 Physical Therapy Treatment Patient Details Name: [name] [name] MRN: [id number] DOB: [birth year] Today's Date: 07/09/2024   History of Present Illness 64 yrs old male with  who presents from podiatry clinic with bilateral foot gangrene and nonhealing wounds. Patient underwent left foot ray amputation on 03/25/24 and Right TMA on 04/26/24.  Despite these intervention and multiple prior revascularization procedures he now presents with dry gangrene/necrotic eschar at the right TMA site and nonhealing wounds at the left TMA site. MRI Right Foot > highly suspicious for bone infarcts and osteomyelitis. Diffuse cellulitis and myofasciitis. MRI left foot > Open wounds along mid metatarsal surgical site, soft tissue inflammation consistent with tissue necrosis, nonviable tissue, osteomyelitis.  Patient underwent revision of left foot transmetatarsal to lisfranc amputation. 06/26/23. 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 PMH significant for poorly controlled type 2 diabetes mellitus, End-stage renal disease on hemodialysis (M/W/F schedule), severe peripheral arterial disease, hyperlipidemia, hypothyroidism, and cardiomyopathy.    PT Comments  Pt appears anxious and labile, pt not answering orientation questions, appears more confused than baseline. Per RN, he has been uncooperative today. Pt received perpendicular in air bed in fetal position, following <10% of commands while repositioning him in safer posture in air bed to reduce risk of pressure of bed rails. Pt mostly TotalA to reposition other than briefly CGA for partial impulsive roll. Pt with elevated pain score per PAIN-AD, RN notified, although when PTA mentioned telling RN of his c/o pain, he said I don't want medications. PTA reinforced his BLE NWB status and not to use LLE to push on air bed when rolling, pt will need reminders. Recommend Prevalon boot for LLE to assist with pressure relief and encourage neutral hip posture when pt in  supine. Fall risk mats replaced on floor at bedside for pt safety at end of session. Patient will benefit from continued inpatient follow up therapy, <3 hours/day if he participates; if not, recommend long-term care as pt caregiver is elderly and not able to provide heavy lift assist per discussion on prior admission in later 2024.   If plan is discharge home, recommend the following: Two people to help with walking and/or transfers;Two people to help with bathing/dressing/bathroom;Help with stairs or ramp for entrance;Assist for transportation;Assistance with cooking/housework;Direct supervision/assist for medications management;Direct supervision/assist for financial management;Supervision due to cognitive status   Can travel by private vehicle     No  Equipment Recommendations  None recommended by PT (Currently would need hoyer lift, hospital air bed, drop arm wheelchair with modification for his R BKA)    Recommendations for Other Services       Precautions / Restrictions Precautions Precautions: Fall Recall of Precautions/Restrictions: Impaired Precaution/Restrictions Comments: Contact precs; poor recall of BLE NWB status; lability Restrictions Weight Bearing Restrictions Per Provider Order: Yes RLE Weight Bearing Per Provider Order: Non weight bearing LLE Weight Bearing Per Provider Order: Non weight bearing Other Position/Activity Restrictions: pt often non-compliant in supine     Mobility  Bed Mobility Overal bed mobility: Needs Assistance Bed Mobility: Rolling Rolling: Total assist, Contact guard assist         General bed mobility comments: Pt received perpendicular in air bed, in fetal position. CGA for safety with pt rolling more toward his L impulsively, pt using LLE pushing on air mattress despite cues not to. After this, pt totalA for repositioning wtih bed in trend to scoot toward [facility] via bed pad assist. pt ignoring cues to use BUE to assist. Pillows placed behind  his R lower back to offload pressure/reduce risk of him leaning on R bed rail.    Transfers                   General transfer comment: pt not willing to attempt EOB posture or elevate HOB this session.    Ambulation/Gait                   Stairs             Wheelchair Mobility     Tilt Bed    Modified Rankin (Stroke Patients Only)       Balance Overall balance assessment: Needs assistance     Sitting balance - Comments: pt not willing to attempt bed chair posture or EOB transfers this session                                    Communication Communication Communication: Impaired Factors Affecting Communication: Difficulty expressing self (pt states my legs hurt but not able to describe quality/severity of pain, see PAIN-AD scale.)  Cognition Arousal: Alert Behavior During Therapy: Lability, Anxious   PT - Cognitive impairments: History of cognitive impairments, Attention, Memory, Sequencing, Problem solving, Safety/Judgement, Awareness, No family/caregiver present to determine baseline                       PT - Cognition Comments: Pt familiar to this therapist from admission 03/2023, also presented on that admission with emotional lability and restlessness and poor attention to task. Pt received in fetal position lying perpendicular in air bed, with shoulders/head near side rail. Pt c/o pain but when PTA offering to notify RN for him, pt states I don't want to take the medicine, RN notified of pt c/o chest pain (mentioned once while in sidelying after PTA assisting him to scoot with totalA via bed pads, pt not participating in using UE to assist) and mostly c/o BLE pain. Pt reoriented to BLE NWB status and participation in therapies but pt repeatedly states I just want to rest and poor receptiveness and carryover of information. Once repositioned more safely in bed and fall risk mats placed at bedside, pt refusing to attempt  EOB trial or bed chair posture. Following commands: Impaired Following commands impaired: Follows one step commands inconsistently    Cueing Cueing Techniques: Verbal cues, Gestural cues, Tactile cues, Visual cues  Exercises      General Comments General comments (skin integrity, edema, etc.): Pt unwilling to don new clean gown (pt damp gown removed and pt agreeable to having blanket placed over him due to feeling cold). Per patient's mother, he doesn't wear clothing at home. PTA offered to assist him to don Depends type briefs before he is discharged to SNF, but pt defers, and per RN he is oliguric. Lunch tray at his bedside appears untouched.      Pertinent Vitals/Pain Pain Assessment Pain Assessment: PAINAD Breathing: normal Negative Vocalization: repeated troubled calling out, loud moaning/groaning, crying Facial Expression: facial grimacing Body Language: tense, distressed pacing, fidgeting Consolability: unable to console, distract or reassure PAINAD Score: 7 Pain Intervention(s): Limited activity within patient's tolerance, Monitored during session, Repositioned, Other (comment) (pt refusing pain meds (PTA offering to notify RN for him), RN notified of pt behaviors)    Home Living  Prior Function            PT Goals (current goals can now be found in the care plan section) Acute Rehab PT Goals Patient Stated Goal: For pain to go down and to be able to go home. PT Goal Formulation: With patient Time For Goal Achievement: 07/12/24 Progress towards PT goals: Not progressing toward goals - comment (poor participation today)    Frequency    Min 2X/week      PT Plan      Co-evaluation              AM-PAC PT 6 Clicks Mobility   Outcome Measure  Help needed turning from your back to your side while in a flat bed without using bedrails?: A Lot (needs mod cues and assist to avoid LE WB when rolling) Help needed moving from  lying on your back to sitting on the side of a flat bed without using bedrails?: Total Help needed moving to and from a bed to a chair (including a wheelchair)?: Total Help needed standing up from a chair using your arms (e.g., wheelchair or bedside chair)?: Total Help needed to walk in hospital room?: Total Help needed climbing 3-5 steps with a railing? : Total 6 Click Score: 7    End of Session Equipment Utilized During Treatment: Other (comment) (bed pad assist) Activity Tolerance: Treatment limited secondary to agitation (apparent intellectual disability, pain/anxiety about movement) Patient left: in bed;with call bell/phone within reach;Other (comment) (PTA attempted to set air bed alarm once pt back in middle of bed, but it would not set properly and continuously alarmed, likely due to pt low body weight) Nurse Communication: Other (comment);Mobility status;Need for lift equipment (PAIN-AD score but pt likley to refuse meds; totalA today to reposition) PT Visit Diagnosis: Other abnormalities of gait and mobility (R26.89);Muscle weakness (generalized) (M62.81);Pain Pain - part of body: Knee;Leg;Ankle and joints of foot     Time: 8897-8885 PT Time Calculation (min) (ACUTE ONLY): 12 min  Charges:    $Therapeutic Activity: 8-22 mins PT General Charges $$ ACUTE PT VISIT: 1 Visit                     Niccole Witthuhn P., PTA Acute Rehabilitation Services Secure Chat Preferred 9a-5:30pm Office: [phone]    [facility] 07/09/2024, 11:38 AM

## 2024-07-09 NOTE — Progress Notes (Signed)
 HD dressing assessed during AM line care by IV team RN. Tegaderm in place. Patient continues to refuse new dressing.

## 2024-07-09 NOTE — TOC Progression Note (Signed)
 Transition of Care [facility]) - Progression Note    Patient Details  Name: [name] [name] MRN: [id number] Date of Birth: 1960/10/28  [facility] of [facility] CM/SW Contact  Bridget Cordella Simmonds, LCSW Phone Number: 07/09/2024, 10:21 AM  Clinical Narrative:   CSW confirmed with [facility]: they can receive pt today.  Allison/Renal informed: outpt HD in place starting tomorrow, 1210 chair time.  [facility] informed.    Expected Discharge Plan: Skilled Nursing Facility Barriers to Discharge: Continued Medical Work up, SNF Pending bed offer               Expected Discharge Plan and Services In-house Referral: Clinical Social Work   Post Acute Care Choice: Skilled Nursing Facility Living arrangements for the past 2 months: Single Family Home Expected Discharge Date: 07/09/24                                     Social Drivers of Health (SDOH) Interventions SDOH Screenings   Food Insecurity: No Food Insecurity (06/24/2024)  Housing: Low Risk (06/24/2024)  Transportation Needs: No Transportation Needs (06/24/2024)  Utilities: Not At Risk (06/24/2024)  Social Connections: Socially Isolated (06/24/2024)  Tobacco Use: Low Risk (06/26/2024)    Readmission Risk Interventions    03/28/2024    1:14 PM 03/29/2023    1:45 PM 03/07/2023    1:31 PM  Readmission Risk Prevention Plan  Transportation Screening Complete Complete Complete  HRI or Home Care Consult   Complete  Palliative Care Screening   Not Applicable  Medication Review (RN Care Manager) Complete Complete Complete  PCP or Specialist appointment within 3-5 days of discharge  Complete   HRI or Home Care Consult Complete Complete   SW Recovery Care/Counseling Consult Patient refused Complete   Palliative Care Screening Complete Complete   Skilled Nursing Facility Patient Refused Complete

## 2024-07-09 NOTE — Plan of Care (Signed)
  Problem: Skin Integrity: Goal: Risk for impaired skin integrity will decrease Outcome: Progressing   Problem: Education: Goal: Knowledge of General Education information will improve Description: Including pain rating scale, medication(s)/side effects and non-pharmacologic comfort measures Outcome: Progressing   Problem: Clinical Measurements: Goal: Ability to maintain clinical measurements within normal limits will improve Outcome: Progressing Goal: Will remain free from infection Outcome: Progressing

## 2024-07-09 NOTE — Discharge Planning (Signed)
 [facility] Patient Discharge Orders- [facility] CLINIC: [facility]  Patient's name: [name] [name] Admit/DC Dates: 06/23/2024 - 07/09/24  Discharge Diagnoses: BLE gangrene s/p L TMA revision 1/13 and R BKA 06/27/23   DNR  Aranesp : Given: no   Date and amount of last dose: n/a  Last Hgb: 12.2 PRBC's Given: no Date/# of units: n/a ESA dose for discharge: none IV Iron dose at discharge: none  Heparin  change: no  EDW Change: yes New EDW: 62kg  Bath Change: no  Access intervention/Change: no Details:  Hectorol/Calcitriol change: no  Discharge Labs: Calcium  9.5 Phosphorus 5.1 Albumin  3.5 K+ 4.5  IV Antibiotics: no Details:  On Coumadin ?: no Last INR: Next INR: Managed By:   OTHER/APPTS/LAB ORDERS:    D/C Meds to be reconciled by nurse after every discharge.  Completed By: Lucie Collet, PA-C 07/09/2024, 1:13 PM  [facility] Pager: [phone]   Reviewed by: MD:______ RN_______

## 2024-07-09 NOTE — Progress Notes (Addendum)
 Patient refusing ALL scheduled meds at this time.   MD made aware of eliquis  refusal.

## 2024-07-09 NOTE — Progress Notes (Signed)
 "  [facility] Progress Note   Subjective:   Seen in his room, lethargic, tends to run low BP. Appears very weak.  Objective Vitals:   07/08/24 1253 07/08/24 1255 07/08/24 1828 07/08/24 2018  BP: 122/76 124/71 124/71 133/79  Pulse: 88 78 90 88  Resp: 16 19 19 16   Temp:  97.6 F (36.4 C) 98.1 F (36.7 C) 98.2 F (36.8 C)  TempSrc:  Axillary Oral Oral  SpO2: 100% 98% 100% 99%  Weight:  64 kg    Height:       Physical Exam General: Chronically ill appearing man, NAD. Room air Heart: RRR Lungs: CTA anteriorly Abdomen: soft, non-distended, +BS Extremities: R BKA with intact staples, L foot bandaged Dialysis Access: TDC in R chest  Additional Objective Labs: Basic Metabolic Panel: Recent Labs  Lab 07/05/24 0815 07/08/24 0900  NA 133* 138  K 4.2 4.7  CL 92* 94*  CO2 27 25  GLUCOSE 87 58*  BUN 24* 45*  CREATININE 5.09* 7.02*  CALCIUM  9.4 9.5  PHOS 4.9* 5.1*   Liver Function Tests: Recent Labs  Lab 07/05/24 0815 07/08/24 0900  ALBUMIN  3.3* 3.5   No results for input(s): LIPASE, AMYLASE in the last 168 hours. CBC: Recent Labs  Lab 07/03/24 1538 07/08/24 0900  WBC 9.5 10.5  HGB 11.3* 12.2*  HCT 35.9* 38.2*  MCV 92.1 90.7  PLT 230 308   Blood Culture    Component Value Date/Time   SDES TISSUE 06/25/2024 1020   SPECREQUEST LEFT FOOT TISSUE 06/25/2024 1020   CULT  06/25/2024 1020    ABUNDANT STAPHYLOCOCCUS AUREUS RARE PSEUDOMONAS FLUORESCENS CRITICAL RESULT CALLED TO, READ BACK BY AND VERIFIED WITH: RN ANGEL M [id number] FCP SUSCEPTIBILITIES PERFORMED ON PREVIOUS CULTURE WITHIN THE LAST 5 DAYS. STAPHYLOCOCCUS AUREUS    REPTSTATUS 06/30/2024 FINAL 06/25/2024 1020    Cardiac Enzymes: No results for input(s): CKTOTAL, CKMB, CKMBINDEX, TROPONINI in the last 168 hours. CBG: No results for input(s): GLUCAP in the last 168 hours.  Iron Studies: No results for input(s): IRON, TIBC, TRANSFERRIN, FERRITIN in the last 72  hours. @lablastinr3 @ Studies/Results: No results found. Medications:  anticoagulant sodium citrate       amitriptyline   10 mg Oral QHS   amLODipine   10 mg Oral q morning   apixaban   2.5 mg Oral BID   ascorbic acid   250 mg Oral BID   atorvastatin   40 mg Oral Daily   calcium  acetate  667 mg Oral TID WC   Chlorhexidine  Gluconate Cloth  6 each Topical Q0600   feeding supplement  237 mL Oral BID BM   levothyroxine   50 mcg Oral QAC breakfast   metoprolol  tartrate  50 mg Oral BID   multivitamin  1 tablet Oral QHS   pantoprazole   40 mg Oral Daily   polyethylene glycol  17 g Oral Daily   senna-docusate  2 tablet Oral Q1200   thiamine   100 mg Oral Daily    Dialysis Orders: MWF - [facility] 4hrs, BFR 400, DFR Auto 1.5,  EDW 63.8kg, 3K/ 2.5Ca Mircera 225 mcg q2wks - last 06/21/24    Assessment/Plan: BLE gangrene left/right foot - S/p left-sided TMA revision 1/13, 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 VVS following. Wound Cx + MSSA + pseudomonas. S/p IV Abx and source control. ESRD - on HD MWF sched - next HD tomorrow as outpt Hypertension/volume - Variable BP, low at times. Continue home meds for now.  Anemia of CKD - Hgb > 11, no ESA  needed Secondary Hyperparathyroidism - Ca/Phos ok - continue Phoslo  as binder. Nutrition - Renal diet with fluid restriction + supps GOC - Appreciate palliative care consult. He is DNR, wants to continue HD for now. Dispo - SNF placement -> appears to be dc'ing today? per notes.      "

## 2024-07-11 ENCOUNTER — Encounter: Admitting: Podiatry

## 2024-07-11 NOTE — Care Management Important Message (Signed)
 Important Message  Patient Details  Name: [name] [name] MRN: [id number] Date of Birth: [birth year]   Important Message Given:  No     [name] 07/11/2024, 10:28 AM

## 2024-07-11 NOTE — Progress Notes (Signed)
 Patient discharged, Important Message Letter mailed to patient.

## 2024-07-12 ENCOUNTER — Other Ambulatory Visit: Payer: Self-pay

## 2024-07-12 ENCOUNTER — Encounter (HOSPITAL_COMMUNITY): Payer: Self-pay

## 2024-07-12 ENCOUNTER — Inpatient Hospital Stay (HOSPITAL_COMMUNITY)
Admission: EM | Admit: 2024-07-12 | Source: Home / Self Care | Attending: Internal Medicine | Admitting: Internal Medicine

## 2024-07-12 ENCOUNTER — Emergency Department (HOSPITAL_COMMUNITY)

## 2024-07-12 DIAGNOSIS — K5641 Fecal impaction: Secondary | ICD-10-CM

## 2024-07-12 DIAGNOSIS — E1169 Type 2 diabetes mellitus with other specified complication: Secondary | ICD-10-CM | POA: Diagnosis present

## 2024-07-12 DIAGNOSIS — I420 Dilated cardiomyopathy: Secondary | ICD-10-CM | POA: Diagnosis present

## 2024-07-12 DIAGNOSIS — K626 Ulcer of anus and rectum: Secondary | ICD-10-CM

## 2024-07-12 DIAGNOSIS — E119 Type 2 diabetes mellitus without complications: Secondary | ICD-10-CM

## 2024-07-12 DIAGNOSIS — Z8719 Personal history of other diseases of the digestive system: Secondary | ICD-10-CM

## 2024-07-12 DIAGNOSIS — Z7902 Long term (current) use of antithrombotics/antiplatelets: Secondary | ICD-10-CM

## 2024-07-12 DIAGNOSIS — K921 Melena: Principal | ICD-10-CM

## 2024-07-12 DIAGNOSIS — I739 Peripheral vascular disease, unspecified: Secondary | ICD-10-CM | POA: Diagnosis present

## 2024-07-12 DIAGNOSIS — K219 Gastro-esophageal reflux disease without esophagitis: Secondary | ICD-10-CM | POA: Diagnosis present

## 2024-07-12 DIAGNOSIS — E039 Hypothyroidism, unspecified: Secondary | ICD-10-CM | POA: Diagnosis present

## 2024-07-12 DIAGNOSIS — K5289 Other specified noninfective gastroenteritis and colitis: Secondary | ICD-10-CM

## 2024-07-12 DIAGNOSIS — R933 Abnormal findings on diagnostic imaging of other parts of digestive tract: Secondary | ICD-10-CM

## 2024-07-12 DIAGNOSIS — D638 Anemia in other chronic diseases classified elsewhere: Secondary | ICD-10-CM | POA: Diagnosis present

## 2024-07-12 DIAGNOSIS — I10 Essential (primary) hypertension: Secondary | ICD-10-CM | POA: Diagnosis present

## 2024-07-12 DIAGNOSIS — N186 End stage renal disease: Secondary | ICD-10-CM

## 2024-07-12 DIAGNOSIS — K625 Hemorrhage of anus and rectum: Secondary | ICD-10-CM | POA: Diagnosis not present

## 2024-07-12 LAB — CBC WITH DIFFERENTIAL/PLATELET
Abs Immature Granulocytes: 0.07 10*3/uL (ref 0.00–0.07)
Basophils Absolute: 0.1 10*3/uL (ref 0.0–0.1)
Basophils Relative: 1 %
Eosinophils Absolute: 0.1 10*3/uL (ref 0.0–0.5)
Eosinophils Relative: 1 %
HCT: 43.8 % (ref 39.0–52.0)
Hemoglobin: 13.3 g/dL (ref 13.0–17.0)
Immature Granulocytes: 1 %
Lymphocytes Relative: 11 %
Lymphs Abs: 1.3 10*3/uL (ref 0.7–4.0)
MCH: 28.8 pg (ref 26.0–34.0)
MCHC: 30.4 g/dL (ref 30.0–36.0)
MCV: 94.8 fL (ref 80.0–100.0)
Monocytes Absolute: 0.5 10*3/uL (ref 0.1–1.0)
Monocytes Relative: 5 %
Neutro Abs: 9.2 10*3/uL — ABNORMAL HIGH (ref 1.7–7.7)
Neutrophils Relative %: 81 %
Platelets: 311 10*3/uL (ref 150–400)
RBC: 4.62 MIL/uL (ref 4.22–5.81)
RDW: 15.6 % — ABNORMAL HIGH (ref 11.5–15.5)
WBC: 11.2 10*3/uL — ABNORMAL HIGH (ref 4.0–10.5)
nRBC: 0 % (ref 0.0–0.2)

## 2024-07-12 LAB — COMPREHENSIVE METABOLIC PANEL WITH GFR
ALT: 7 U/L (ref 0–44)
AST: 24 U/L (ref 15–41)
Albumin: 3.7 g/dL (ref 3.5–5.0)
Alkaline Phosphatase: 106 U/L (ref 38–126)
Anion gap: 18 — ABNORMAL HIGH (ref 5–15)
BUN: 42 mg/dL — ABNORMAL HIGH (ref 8–23)
CO2: 24 mmol/L (ref 22–32)
Calcium: 9.9 mg/dL (ref 8.9–10.3)
Chloride: 99 mmol/L (ref 98–111)
Creatinine, Ser: 7.18 mg/dL — ABNORMAL HIGH (ref 0.61–1.24)
GFR, Estimated: 8 mL/min — ABNORMAL LOW
Glucose, Bld: 121 mg/dL — ABNORMAL HIGH (ref 70–99)
Potassium: 4.7 mmol/L (ref 3.5–5.1)
Sodium: 142 mmol/L (ref 135–145)
Total Bilirubin: 0.5 mg/dL (ref 0.0–1.2)
Total Protein: 7.8 g/dL (ref 6.5–8.1)

## 2024-07-12 LAB — CBC
HCT: 37.8 % — ABNORMAL LOW (ref 39.0–52.0)
Hemoglobin: 11.4 g/dL — ABNORMAL LOW (ref 13.0–17.0)
MCH: 28.3 pg (ref 26.0–34.0)
MCHC: 30.2 g/dL (ref 30.0–36.0)
MCV: 93.8 fL (ref 80.0–100.0)
Platelets: 344 10*3/uL (ref 150–400)
RBC: 4.03 MIL/uL — ABNORMAL LOW (ref 4.22–5.81)
RDW: 15.5 % (ref 11.5–15.5)
WBC: 13 10*3/uL — ABNORMAL HIGH (ref 4.0–10.5)
nRBC: 0 % (ref 0.0–0.2)

## 2024-07-12 LAB — PROTIME-INR
INR: 1.2 (ref 0.8–1.2)
Prothrombin Time: 15.4 s — ABNORMAL HIGH (ref 11.4–15.2)

## 2024-07-12 MED ORDER — ONDANSETRON HCL 4 MG/2ML IJ SOLN
4.0000 mg | Freq: Four times a day (QID) | INTRAMUSCULAR | Status: AC | PRN
  Administered 2024-07-16 – 2024-07-17 (×2): 4 mg via INTRAVENOUS
  Filled 2024-07-12 (×2): qty 2

## 2024-07-12 MED ORDER — LACTATED RINGERS IV SOLN: INTRAVENOUS | Status: AC

## 2024-07-12 MED ORDER — ONDANSETRON HCL 4 MG PO TABS
4.0000 mg | ORAL_TABLET | Freq: Four times a day (QID) | ORAL | Status: AC | PRN
  Administered 2024-07-16: 4 mg via ORAL
  Filled 2024-07-12: qty 1

## 2024-07-12 MED ORDER — PANTOPRAZOLE SODIUM 40 MG IV SOLR
40.0000 mg | Freq: Once | INTRAVENOUS | Status: AC
  Administered 2024-07-12: 40 mg via INTRAVENOUS
  Filled 2024-07-12: qty 10

## 2024-07-12 MED ORDER — IOHEXOL 350 MG/ML SOLN
100.0000 mL | Freq: Once | INTRAVENOUS | Status: AC | PRN
  Administered 2024-07-12: 100 mL via INTRAVENOUS

## 2024-07-12 NOTE — ED Triage Notes (Signed)
 Pt BIB [facility] fro [facility] d/t one episode of a bloody stool this AM. Did have a HX of GI bleed in 2024 with infusions. A/Ox4, denies any acute pain during triage.

## 2024-07-12 NOTE — ED Notes (Signed)
 Cleaned patient up due to him taking his brief off and bloody stool on bed.

## 2024-07-12 NOTE — ED Notes (Signed)
 EDP [place] at [facility]. Pt calmer, NAD, interactive, cooperative at this time, following commands and answering questions. Previously refused rectal exam.

## 2024-07-12 NOTE — ED Notes (Signed)
 Patient refused rectal exam

## 2024-07-13 DIAGNOSIS — R933 Abnormal findings on diagnostic imaging of other parts of digestive tract: Secondary | ICD-10-CM

## 2024-07-13 DIAGNOSIS — D638 Anemia in other chronic diseases classified elsewhere: Secondary | ICD-10-CM | POA: Diagnosis not present

## 2024-07-13 DIAGNOSIS — K921 Melena: Principal | ICD-10-CM

## 2024-07-13 DIAGNOSIS — D631 Anemia in chronic kidney disease: Secondary | ICD-10-CM | POA: Diagnosis not present

## 2024-07-13 DIAGNOSIS — E119 Type 2 diabetes mellitus without complications: Secondary | ICD-10-CM

## 2024-07-13 DIAGNOSIS — I7789 Other specified disorders of arteries and arterioles: Secondary | ICD-10-CM

## 2024-07-13 DIAGNOSIS — Z7902 Long term (current) use of antithrombotics/antiplatelets: Secondary | ICD-10-CM | POA: Diagnosis not present

## 2024-07-13 DIAGNOSIS — Z8719 Personal history of other diseases of the digestive system: Secondary | ICD-10-CM

## 2024-07-13 DIAGNOSIS — N186 End stage renal disease: Secondary | ICD-10-CM | POA: Diagnosis not present

## 2024-07-13 DIAGNOSIS — Z992 Dependence on renal dialysis: Secondary | ICD-10-CM

## 2024-07-13 DIAGNOSIS — I739 Peripheral vascular disease, unspecified: Secondary | ICD-10-CM | POA: Diagnosis not present

## 2024-07-13 DIAGNOSIS — K625 Hemorrhage of anus and rectum: Secondary | ICD-10-CM | POA: Diagnosis not present

## 2024-07-13 DIAGNOSIS — K5289 Other specified noninfective gastroenteritis and colitis: Secondary | ICD-10-CM

## 2024-07-13 DIAGNOSIS — K6289 Other specified diseases of anus and rectum: Secondary | ICD-10-CM | POA: Diagnosis not present

## 2024-07-13 LAB — CBC
HCT: 31.9 % — ABNORMAL LOW (ref 39.0–52.0)
HCT: 37.1 % — ABNORMAL LOW (ref 39.0–52.0)
HCT: 41.3 % (ref 39.0–52.0)
Hemoglobin: 9.8 g/dL — ABNORMAL LOW (ref 13.0–17.0)
Hemoglobin: 11.9 g/dL — ABNORMAL LOW (ref 13.0–17.0)
Hemoglobin: 13.5 g/dL (ref 13.0–17.0)
MCH: 28.8 pg (ref 26.0–34.0)
MCH: 28.5 pg (ref 26.0–34.0)
MCH: 28.4 pg (ref 26.0–34.0)
MCHC: 30.7 g/dL (ref 30.0–36.0)
MCHC: 32.1 g/dL (ref 30.0–36.0)
MCHC: 32.7 g/dL (ref 30.0–36.0)
MCV: 93.8 fL (ref 80.0–100.0)
MCV: 89 fL (ref 80.0–100.0)
MCV: 86.8 fL (ref 80.0–100.0)
Platelets: 303 10*3/uL (ref 150–400)
Platelets: 322 10*3/uL (ref 150–400)
Platelets: 286 10*3/uL (ref 150–400)
RBC: 3.4 MIL/uL — ABNORMAL LOW (ref 4.22–5.81)
RBC: 4.17 MIL/uL — ABNORMAL LOW (ref 4.22–5.81)
RBC: 4.76 MIL/uL (ref 4.22–5.81)
RDW: 15.7 % — ABNORMAL HIGH (ref 11.5–15.5)
RDW: 16.7 % — ABNORMAL HIGH (ref 11.5–15.5)
RDW: 18.3 % — ABNORMAL HIGH (ref 11.5–15.5)
WBC: 12.1 10*3/uL — ABNORMAL HIGH (ref 4.0–10.5)
WBC: 13.8 10*3/uL — ABNORMAL HIGH (ref 4.0–10.5)
WBC: 24.6 10*3/uL — ABNORMAL HIGH (ref 4.0–10.5)
nRBC: 0 % (ref 0.0–0.2)
nRBC: 0 % (ref 0.0–0.2)
nRBC: 0 % (ref 0.0–0.2)

## 2024-07-13 LAB — COMPREHENSIVE METABOLIC PANEL WITH GFR
ALT: 7 U/L (ref 0–44)
AST: 18 U/L (ref 15–41)
Albumin: 3.1 g/dL — ABNORMAL LOW (ref 3.5–5.0)
Alkaline Phosphatase: 84 U/L (ref 38–126)
Anion gap: 19 — ABNORMAL HIGH (ref 5–15)
BUN: 50 mg/dL — ABNORMAL HIGH (ref 8–23)
CO2: 21 mmol/L — ABNORMAL LOW (ref 22–32)
Calcium: 9.1 mg/dL (ref 8.9–10.3)
Chloride: 99 mmol/L (ref 98–111)
Creatinine, Ser: 8.08 mg/dL — ABNORMAL HIGH (ref 0.61–1.24)
GFR, Estimated: 7 mL/min — ABNORMAL LOW
Glucose, Bld: 110 mg/dL — ABNORMAL HIGH (ref 70–99)
Potassium: 5.2 mmol/L — ABNORMAL HIGH (ref 3.5–5.1)
Sodium: 138 mmol/L (ref 135–145)
Total Bilirubin: 0.4 mg/dL (ref 0.0–1.2)
Total Protein: 6.4 g/dL — ABNORMAL LOW (ref 6.5–8.1)

## 2024-07-13 LAB — GLUCOSE, CAPILLARY
Glucose-Capillary: 124 mg/dL — ABNORMAL HIGH (ref 70–99)
Glucose-Capillary: 132 mg/dL — ABNORMAL HIGH (ref 70–99)

## 2024-07-13 LAB — PREPARE RBC (CROSSMATCH)

## 2024-07-13 MED ORDER — DIPHENHYDRAMINE HCL 25 MG PO CAPS
25.0000 mg | ORAL_CAPSULE | Freq: Once | ORAL | Status: AC
  Administered 2024-07-13: 25 mg via ORAL
  Filled 2024-07-13: qty 1

## 2024-07-13 MED ORDER — LEVOTHYROXINE SODIUM 75 MCG PO TABS
75.0000 ug | ORAL_TABLET | Freq: Every day | ORAL | Status: AC
  Administered 2024-07-14 – 2024-07-19 (×5): 75 ug via ORAL
  Filled 2024-07-13 (×4): qty 1

## 2024-07-13 MED ORDER — SMOG ENEMA
400.0000 mL | Freq: Once | RECTAL | Status: AC
  Administered 2024-07-13: 400 mL via RECTAL
  Filled 2024-07-13: qty 960

## 2024-07-13 MED ORDER — PENTAFLUOROPROP-TETRAFLUOROETH EX AERO: 1.0000 | INHALATION_SPRAY | CUTANEOUS | Status: DC | PRN

## 2024-07-13 MED ORDER — MIDODRINE HCL 5 MG PO TABS
10.0000 mg | ORAL_TABLET | Freq: Three times a day (TID) | ORAL | Status: AC
  Administered 2024-07-13 – 2024-07-19 (×9): 10 mg via ORAL
  Filled 2024-07-13 (×13): qty 2

## 2024-07-13 MED ORDER — ALTEPLASE 2 MG IJ SOLR: 2.0000 mg | Freq: Once | INTRAMUSCULAR | Status: DC | PRN

## 2024-07-13 MED ORDER — HEPARIN SODIUM (PORCINE) 1000 UNIT/ML DIALYSIS
1000.0000 [IU] | INTRAMUSCULAR | Status: DC | PRN
  Administered 2024-07-13: 1000 [IU]

## 2024-07-13 MED ORDER — HALOPERIDOL LACTATE 5 MG/ML IJ SOLN: 2.0000 mg | Freq: Four times a day (QID) | INTRAMUSCULAR | Status: AC | PRN

## 2024-07-13 MED ORDER — POLYETHYLENE GLYCOL 3350 17 G PO PACK
17.0000 g | PACK | Freq: Three times a day (TID) | ORAL | Status: AC
  Administered 2024-07-13 – 2024-07-19 (×14): 17 g via ORAL
  Filled 2024-07-13 (×16): qty 1

## 2024-07-13 MED ORDER — GABAPENTIN 100 MG PO CAPS
100.0000 mg | ORAL_CAPSULE | Freq: Every day | ORAL | Status: AC
  Administered 2024-07-13 – 2024-07-19 (×6): 100 mg via ORAL
  Filled 2024-07-13 (×7): qty 1

## 2024-07-13 MED ORDER — THIAMINE MONONITRATE 100 MG PO TABS
100.0000 mg | ORAL_TABLET | Freq: Every day | ORAL | Status: AC
  Administered 2024-07-13 – 2024-07-19 (×7): 100 mg via ORAL
  Filled 2024-07-13 (×7): qty 1

## 2024-07-13 MED ORDER — BISACODYL 10 MG RE SUPP: 10.0000 mg | Freq: Every day | RECTAL | Status: AC | PRN

## 2024-07-13 MED ORDER — SENNOSIDES-DOCUSATE SODIUM 8.6-50 MG PO TABS
2.0000 | ORAL_TABLET | Freq: Two times a day (BID) | ORAL | Status: AC
  Administered 2024-07-13 – 2024-07-19 (×13): 2 via ORAL
  Filled 2024-07-13 (×14): qty 2

## 2024-07-13 MED ORDER — HEPARIN SODIUM (PORCINE) 1000 UNIT/ML IJ SOLN
INTRAMUSCULAR | Status: AC
  Filled 2024-07-13: qty 4

## 2024-07-13 MED ORDER — LEVOTHYROXINE SODIUM 50 MCG PO TABS: 50.0000 ug | ORAL_TABLET | Freq: Every day | ORAL | Status: DC

## 2024-07-13 MED ORDER — ANTICOAGULANT SODIUM CITRATE 4% (200MG/5ML) IV SOLN: 5.0000 mL | Status: DC | PRN

## 2024-07-13 MED ORDER — SODIUM CHLORIDE 0.9 % IV SOLN
2.0000 g | INTRAVENOUS | Status: AC
  Administered 2024-07-13 – 2024-07-19 (×7): 2 g via INTRAVENOUS
  Filled 2024-07-13 (×7): qty 20

## 2024-07-13 MED ORDER — ACETAMINOPHEN 325 MG PO TABS
650.0000 mg | ORAL_TABLET | Freq: Once | ORAL | Status: AC
  Administered 2024-07-13: 650 mg via ORAL
  Filled 2024-07-13: qty 2

## 2024-07-13 MED ORDER — INSULIN ASPART 100 UNIT/ML IJ SOLN
0.0000 [IU] | Freq: Three times a day (TID) | INTRAMUSCULAR | Status: AC
  Administered 2024-07-15 (×2): 2 [IU] via SUBCUTANEOUS
  Administered 2024-07-19 (×2): 1 [IU] via SUBCUTANEOUS
  Filled 2024-07-13 (×3): qty 1

## 2024-07-13 MED ORDER — METRONIDAZOLE 500 MG/100ML IV SOLN
500.0000 mg | Freq: Two times a day (BID) | INTRAVENOUS | Status: AC
  Administered 2024-07-13 – 2024-07-19 (×14): 500 mg via INTRAVENOUS
  Filled 2024-07-13 (×14): qty 100

## 2024-07-13 MED ORDER — SODIUM CHLORIDE 0.9% IV SOLUTION: Freq: Once | INTRAVENOUS | Status: AC

## 2024-07-13 MED ORDER — PANTOPRAZOLE SODIUM 40 MG IV SOLR
40.0000 mg | Freq: Two times a day (BID) | INTRAVENOUS | Status: DC
  Administered 2024-07-13 – 2024-07-14 (×4): 40 mg via INTRAVENOUS
  Filled 2024-07-13 (×4): qty 10

## 2024-07-13 MED ORDER — CHLORHEXIDINE GLUCONATE CLOTH 2 % EX PADS
6.0000 | MEDICATED_PAD | Freq: Every day | CUTANEOUS | Status: DC
  Administered 2024-07-13 – 2024-07-19 (×6): 6 via TOPICAL

## 2024-07-13 NOTE — Progress Notes (Signed)
 Patient went to Hemodialysis right after enema administration, hence unable to monitor the outcome.

## 2024-07-13 NOTE — Progress Notes (Signed)
 Patient has around fresh bleeding per rectum collected in his rectal pouch.

## 2024-07-13 NOTE — Discharge Instructions (Signed)

## 2024-07-13 NOTE — Care Management (Signed)
 SDOH resources added to AVS

## 2024-07-13 NOTE — Progress Notes (Signed)
 "                        PROGRESS NOTE        PATIENT DETAILS Name: [name] [name] Age: 64 y.o. Sex: male Date of Birth: 1961/04/18 Admit Date: 07/12/2024 Admitting Physician Emery LITTIE Fuss, MD ERE:Fnmmnt, Beverley, MD  Brief Summary: Patient is a 64 y.o.  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  Significant events: 1/30>> admit to [facility].  Significant studies: 1/30>> CT angio GI bleed: No active bleed-fecal impaction with stercoral proctitis.  Significant microbiology data: None  Procedures: None  Consults: GI Nephrology  Subjective: Multiple episodes of hematochezia overnight-very uncooperative-refusing meds-blood work at times.  Objective: Vitals: Blood pressure 98/65, pulse 88, temperature 98.3 F (36.8 C), temperature source Oral, resp. rate 18, height 5' 10 (1.778 m), weight 49.9 kg, SpO2 93%.   Exam: Gen Exam:Alert awake-not in any distress HEENT:atraumatic, normocephalic Chest: B/L clear to auscultation anteriorly CVS:S1S2 regular Abdomen:soft non tender, non distended Extremities: Left foot-s/p TMA-bandaged-s/p right BKA-Staples intact. Neurology: Non focal Skin: no rash  Pertinent Labs/Radiology:    Latest Ref Rng & Units 07/13/2024    5:46 AM 07/12/2024   10:39 PM 07/12/2024    2:00 PM  CBC  WBC 4.0 - 10.5 K/uL 12.1  13.0  11.2   Hemoglobin 13.0 - 17.0 g/dL 9.8  88.5  86.6   Hematocrit 39.0 - 52.0 % 31.9  37.8  43.8   Platelets 150 - 400 K/uL 303  344  311     Lab Results  Component Value Date   NA 138 07/13/2024   K 5.2 (H) 07/13/2024   CL 99 07/13/2024   CO2 21 (L) 07/13/2024      Assessment/Plan: Lower GI bleed with ABLA Unclear if this is diverticular bleed or from stercoral colitis. Multiple episodes of hematochezia overnight and this  morning-fresh blood evident in rectal pouch Hb down to 9.8 Patient very uncooperative to blood draws-refusing transfusion-spoke with his mother over the phone (RN Ranjita present)-risk/benefits/rationale of PRBC transfusion discussed-she has talked to the patient-and both of now consented-we will transfuse 2 units of PRBC. Check CBC frequently Await GI input  Stercoral colitis Significant stool burden on CT with fecal impaction MiraLAX /senna As needed Dulcolax suppository Add Rocephin /Flagyl   ESRD on HD MWF Nephrology consulted-outpatient missed HD yesterday  Multifactorial anemia Secondary to blood loss-ESRD See above  PAD-s/p right BKA-left TMA Antiplatelets on hold due to GI bleeding  Chronic combined systolic and diastolic heart failure Euvolemic Volume removal with HD  HTN BP soft Continue to hold all antihypertensive-Will start midodrine  for BP support-as patient will require HD later today.  HLD Statin  GERD PPI  Hypothyroidism Synthroid   DM-2 (A1c 4.6 in 11/13) CBG stable Start extra sensitive SSI  ?  Cognitive issues/poor compliance Refusing blood work-refusing multiple meds-initially refused transfusion See above documentation-spoke with mother.   Code status:   Code Status: Limited: Do not attempt resuscitation (DNR) -DNR-LIMITED -Do Not Intubate/DNI    DVT Prophylaxis: SCDs Start: 07/12/24 2207   Family Communication: Mother-Bertha [phone]-updated-subsequently consent obtained after she spoke with patient for blood transfusion-on 1/31.  Disposition Plan: Status is: Inpatient Remains inpatient appropriate because: Severity of illness   Planned Discharge Destination:Home health   Diet: Diet Order  Diet clear liquid Room service appropriate? Yes; Fluid consistency: Thin  Diet effective now                     Antimicrobial agents: Anti-infectives (From admission, onward)    None         MEDICATIONS: Scheduled Meds:  sodium chloride    Intravenous Once   Chlorhexidine  Gluconate Cloth  6 each Topical Q0600   gabapentin   100 mg Oral QHS   [START ON 07/14/2024] levothyroxine   50 mcg Oral QAC breakfast   midodrine   10 mg Oral TID WC   pantoprazole  (PROTONIX ) IV  40 mg Intravenous Q12H   polyethylene glycol  17 g Oral TID   senna-docusate  2 tablet Oral BID   thiamine   100 mg Oral Daily   Continuous Infusions:  lactated ringers  10 mL/hr at 07/13/24 0956   PRN Meds:.bisacodyl , ondansetron  **OR** ondansetron  (ZOFRAN ) IV   I have personally reviewed following labs and imaging studies  LABORATORY DATA: CBC: Recent Labs  Lab 07/08/24 0900 07/12/24 1400 07/12/24 2239 07/13/24 0546  WBC 10.5 11.2* 13.0* 12.1*  NEUTROABS  --  9.2*  --   --   HGB 12.2* 13.3 11.4* 9.8*  HCT 38.2* 43.8 37.8* 31.9*  MCV 90.7 94.8 93.8 93.8  PLT 308 311 344 303    Basic Metabolic Panel: Recent Labs  Lab 07/08/24 0900 07/12/24 1400 07/13/24 0546  NA 138 142 138  K 4.7 4.7 5.2*  CL 94* 99 99  CO2 25 24 21*  GLUCOSE 58* 121* 110*  BUN 45* 42* 50*  CREATININE 7.02* 7.18* 8.08*  CALCIUM  9.5 9.9 9.1  PHOS 5.1*  --   --     GFR: Estimated Creatinine Clearance: 6.4 mL/min (A) (by C-G formula based on SCr of 8.08 mg/dL (H)).  Liver Function Tests: Recent Labs  Lab 07/08/24 0900 07/12/24 1400 07/13/24 0546  AST  --  24 18  ALT  --  7 7  ALKPHOS  --  106 84  BILITOT  --  0.5 0.4  PROT  --  7.8 6.4*  ALBUMIN  3.5 3.7 3.1*   No results for input(s): LIPASE, AMYLASE in the last 168 hours. No results for input(s): AMMONIA in the last 168 hours.  Coagulation Profile: Recent Labs  Lab 07/12/24 1400  INR 1.2    Cardiac Enzymes: No results for input(s): CKTOTAL, CKMB, CKMBINDEX, TROPONINI in the last 168 hours.  BNP (last 3 results) No results for input(s): PROBNP in the last 8760 hours.  Lipid Profile: No results for input(s): CHOL, HDL,  LDLCALC, TRIG, CHOLHDL, LDLDIRECT in the last 72 hours.  Thyroid  Function Tests: No results for input(s): TSH, T4TOTAL, FREET4, T3FREE, THYROIDAB in the last 72 hours.  Anemia Panel: No results for input(s): VITAMINB12, FOLATE, FERRITIN, TIBC, IRON, RETICCTPCT in the last 72 hours.  Urine analysis:    Component Value Date/Time   COLORURINE AMBER (A) 03/06/2023 1036   APPEARANCEUR CLOUDY (A) 03/06/2023 1036   LABSPEC 1.019 03/06/2023 1036   PHURINE 5.0 03/06/2023 1036   GLUCOSEU NEGATIVE 03/06/2023 1036   HGBUR LARGE (A) 03/06/2023 1036   BILIRUBINUR NEGATIVE 03/06/2023 1036   KETONESUR NEGATIVE 03/06/2023 1036   PROTEINUR >=300 (A) 03/06/2023 1036   NITRITE NEGATIVE 03/06/2023 1036   LEUKOCYTESUR MODERATE (A) 03/06/2023 1036    Sepsis Labs: Lactic Acid, Venous    Component Value Date/Time   LATICACIDVEN 1.0 05/13/2024 1610    MICROBIOLOGY: No results found for this or any previous  visit (from the past 240 hours).  RADIOLOGY STUDIES/RESULTS: CT ANGIO GI BLEED Result Date: 07/12/2024 EXAM: CTA ABDOMEN AND PELVIS WITH CONTRAST 07/12/2024 04:20:38 PM TECHNIQUE: CTA images of the abdomen and pelvis with intravenous contrast. Three-dimensional MIP/volume rendered formations were performed. Automated exposure control, iterative reconstruction, and/or weight based adjustment of the mA/kV was utilized to reduce the radiation dose to as low as reasonably achievable. COMPARISON: Hypertension 2025. CLINICAL HISTORY: Large volume hematochezia. FINDINGS: VASCULATURE: GI BLEED: No active extravasation of contrast within the GI tract. AORTA: Moderate mixed atherosclerotic plaque within the abdominal aorta. No aneurysmal dissection. No hemodynamically significant stenosis. CELIAC TRUNK: Thin dissection within the celiac axis, which does not appear flow-limiting. No superimposed aneurysm with maximal diameter of the celiac axis at 9 mm. Extensive visceral  arteriosclerosis. Slightly corrugated appearance of the celiac axis suggest changes of underlying fibromuscular dysplasia. SUPERIOR MESENTERIC ARTERY: Extensive visceral arteriosclerosis. No acute finding. No occlusion or significant stenosis. INFERIOR MESENTERIC ARTERY: Extensive visceral arteriosclerosis. No acute finding. No occlusion or significant stenosis. RENAL ARTERIES: Extensive visceral arteriosclerosis. No acute finding. No occlusion or significant stenosis. ILIAC ARTERIES: No acute finding. No occlusion or significant stenosis. ABDOMEN/PELVIS: LOWER CHEST: Visualized portion of the lower chest demonstrates extensive calcifications of the aortic valve and mitral valve leaflets. Echocardiography may be helpful to assess the degree of valvular dysfunction. Extensive coronary artery calcifications. LIVER: The liver is unremarkable. GALLBLADDER AND BILE DUCTS: Gallbladder is unremarkable. No biliary ductal dilatation. SPLEEN: The spleen is unremarkable. 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 ADRENAL GLANDS: Bilateral adrenal glands demonstrate no acute abnormality. KIDNEYS, URETERS AND BLADDER: Simple cortical cysts within the left kidney for which no follow up imaging is recommended. No stones in the kidneys or ureters. No hydronephrosis. No perinephric or periureteral stranding. Urinary bladder is unremarkable. GI AND BOWEL: Large volume stool within the rectal wall with superimposed circumferential rectal wall thickening in keeping with fecal impaction and probable stercoral proctitis. Moderate colonic stool burden. No obstruction. Appendix normal. The stomach, small bowel, and large bowel are otherwise unremarkable. REPRODUCTIVE: Reproductive organs are unremarkable. PERITONEUM AND RETROPERITONEUM: No  ascites or free air. LYMPH NODES: No lymphadenopathy. BONES AND SOFT TISSUES: Osseous structures are age appropriate. No acute bone abnormality. No lytic or blastic bone lesion. No acute soft tissue abnormality. IMPRESSION: 1. No active gastrointestinal hemorrhage identified. 2. Fecal impaction with circumferential rectal wall thickening in keeping with stercoral proctitis; moderate colonic stool burden without obstruction. 3. 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 4. Moderate mixed atherosclerotic plaque in the abdominal aorta; extensive visceral arteriosclerosis. 5. Extensive calcifications of the aortic valve and mitral valve leaflets; echocardiography may be helpful to assess for valvular dysfunction; extensive coronary artery calcifications. 6. 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 Electronically signed by: Dorethia Molt MD 07/12/2024 05:10 PM EST RP Workstation: [id number]     LOS: 1 day   Donalda Applebaum, MD  [facility]    To contact the attending provider between 7A-7P or the covering provider during after hours 7P-7A, please log into the web site [email or web address] and access using universal [place] password for that web site. If you do not have the password, please call the hospital operator.  07/13/2024, 10:02  AM    "

## 2024-07-13 NOTE — Progress Notes (Signed)
" °   07/13/24 1935  Vitals  Temp 98.4 F (36.9 C)  Pulse Rate 98  Resp 16  BP (!) 134/92  SpO2 98 %  O2 Device Room Air  Weight (S)  53.2 kg (Bed Scale)  Type of Weight Post-Dialysis  Oxygen Therapy  Patient Activity (if Appropriate) In bed  Pulse Oximetry Type Continuous  Post Treatment  Dialyzer Clearance Clear  Hemodialysis Intake (mL) 0 mL  Liters Processed 51.5  Fluid Removed (mL) 1000 mL  Tolerated HD Treatment Yes  Post-Hemodialysis Comments Tx. Completed. Pt. agitated and screaming and hollering. UF goal maintained, Admin medication per. order. Report call to 5W Bedside Nurse.   Received patient in bed to unit.  Alert and oriented.  Informed consent signed and in chart.   TX duration:3  Patient tolerated well.  Transported back to the room  Alert, without acute distress.  Hand-off given to patient's nurse.   Access used: Yes Access issues: No  Total UF removed: 1000 Medication(s) given: See MAR Post HD VS: See Above Grid Post HD weight: 53.2 kg   Zebedee DELE[facility] "

## 2024-07-13 NOTE — Progress Notes (Signed)
 Pt has returned from dialysis , vitals stable.

## 2024-07-14 DIAGNOSIS — K6289 Other specified diseases of anus and rectum: Secondary | ICD-10-CM | POA: Diagnosis not present

## 2024-07-14 DIAGNOSIS — R933 Abnormal findings on diagnostic imaging of other parts of digestive tract: Secondary | ICD-10-CM

## 2024-07-14 DIAGNOSIS — Z7902 Long term (current) use of antithrombotics/antiplatelets: Secondary | ICD-10-CM

## 2024-07-14 DIAGNOSIS — D638 Anemia in other chronic diseases classified elsewhere: Secondary | ICD-10-CM | POA: Diagnosis not present

## 2024-07-14 DIAGNOSIS — K625 Hemorrhage of anus and rectum: Secondary | ICD-10-CM | POA: Diagnosis not present

## 2024-07-14 DIAGNOSIS — K626 Ulcer of anus and rectum: Secondary | ICD-10-CM

## 2024-07-14 DIAGNOSIS — N186 End stage renal disease: Secondary | ICD-10-CM | POA: Diagnosis not present

## 2024-07-14 DIAGNOSIS — I739 Peripheral vascular disease, unspecified: Secondary | ICD-10-CM | POA: Diagnosis not present

## 2024-07-14 LAB — HEPATITIS B SURFACE ANTIGEN: Hepatitis B Surface Ag: NONREACTIVE

## 2024-07-14 LAB — CBC
HCT: 36.9 % — ABNORMAL LOW (ref 39.0–52.0)
HCT: 38.4 % — ABNORMAL LOW (ref 39.0–52.0)
Hemoglobin: 12 g/dL — ABNORMAL LOW (ref 13.0–17.0)
Hemoglobin: 12.6 g/dL — ABNORMAL LOW (ref 13.0–17.0)
MCH: 28 pg (ref 26.0–34.0)
MCH: 28.4 pg (ref 26.0–34.0)
MCHC: 32.5 g/dL (ref 30.0–36.0)
MCHC: 32.8 g/dL (ref 30.0–36.0)
MCV: 86.2 fL (ref 80.0–100.0)
MCV: 86.7 fL (ref 80.0–100.0)
Platelets: 270 10*3/uL (ref 150–400)
Platelets: 282 10*3/uL (ref 150–400)
RBC: 4.28 MIL/uL (ref 4.22–5.81)
RBC: 4.43 MIL/uL (ref 4.22–5.81)
RDW: 18.2 % — ABNORMAL HIGH (ref 11.5–15.5)
RDW: 17.9 % — ABNORMAL HIGH (ref 11.5–15.5)
WBC: 17.7 10*3/uL — ABNORMAL HIGH (ref 4.0–10.5)
WBC: 18.6 10*3/uL — ABNORMAL HIGH (ref 4.0–10.5)
nRBC: 0 % (ref 0.0–0.2)
nRBC: 0 % (ref 0.0–0.2)

## 2024-07-14 LAB — TYPE AND SCREEN
ABO/RH(D): AB POS
Antibody Screen: NEGATIVE
Unit division: 0
Unit division: 0

## 2024-07-14 LAB — BPAM RBC
Blood Product Expiration Date: 202602222359
Blood Product Expiration Date: 202602222359
ISSUE DATE / TIME: 202601311007
ISSUE DATE / TIME: 202601311655
Unit Type and Rh: 6200
Unit Type and Rh: 6200

## 2024-07-14 LAB — RENAL FUNCTION PANEL
Albumin: 3.4 g/dL — ABNORMAL LOW (ref 3.5–5.0)
Anion gap: 19 — ABNORMAL HIGH (ref 5–15)
BUN: 31 mg/dL — ABNORMAL HIGH (ref 8–23)
CO2: 21 mmol/L — ABNORMAL LOW (ref 22–32)
Calcium: 8.6 mg/dL — ABNORMAL LOW (ref 8.9–10.3)
Chloride: 95 mmol/L — ABNORMAL LOW (ref 98–111)
Creatinine, Ser: 4.89 mg/dL — ABNORMAL HIGH (ref 0.61–1.24)
GFR, Estimated: 12 mL/min — ABNORMAL LOW
Glucose, Bld: 93 mg/dL (ref 70–99)
Phosphorus: 4.6 mg/dL (ref 2.5–4.6)
Potassium: 4.1 mmol/L (ref 3.5–5.1)
Sodium: 136 mmol/L (ref 135–145)

## 2024-07-14 LAB — GLUCOSE, CAPILLARY
Glucose-Capillary: 78 mg/dL (ref 70–99)
Glucose-Capillary: 82 mg/dL (ref 70–99)
Glucose-Capillary: 103 mg/dL — ABNORMAL HIGH (ref 70–99)
Glucose-Capillary: 179 mg/dL — ABNORMAL HIGH (ref 70–99)

## 2024-07-14 MED ORDER — PANTOPRAZOLE SODIUM 40 MG PO TBEC
40.0000 mg | DELAYED_RELEASE_TABLET | Freq: Two times a day (BID) | ORAL | Status: AC
  Administered 2024-07-14 – 2024-07-19 (×10): 40 mg via ORAL
  Filled 2024-07-14 (×11): qty 1

## 2024-07-14 MED ORDER — CHLORHEXIDINE GLUCONATE CLOTH 2 % EX PADS
6.0000 | MEDICATED_PAD | Freq: Every day | CUTANEOUS | Status: DC
  Administered 2024-07-15 – 2024-07-19 (×5): 6 via TOPICAL

## 2024-07-14 MED ORDER — OXYCODONE HCL 5 MG PO TABS
5.0000 mg | ORAL_TABLET | Freq: Four times a day (QID) | ORAL | Status: AC | PRN
  Administered 2024-07-14 – 2024-07-18 (×4): 5 mg via ORAL
  Filled 2024-07-14 (×4): qty 1

## 2024-07-14 MED ORDER — SMOG ENEMA
960.0000 mL | Freq: Once | RECTAL | Status: DC
  Filled 2024-07-14: qty 960

## 2024-07-14 MED ORDER — BISACODYL 5 MG PO TBEC
10.0000 mg | DELAYED_RELEASE_TABLET | Freq: Once | ORAL | Status: AC
  Administered 2024-07-14: 10 mg via ORAL
  Filled 2024-07-14: qty 2

## 2024-07-14 NOTE — Plan of Care (Signed)
  Problem: Nutrition: Goal: Adequate nutrition will be maintained Outcome: Progressing   Problem: Coping: Goal: Level of anxiety will decrease Outcome: Progressing   Problem: Pain Managment: Goal: General experience of comfort will improve and/or be controlled Outcome: Progressing   Problem: Skin Integrity: Goal: Risk for impaired skin integrity will decrease Outcome: Progressing

## 2024-07-14 NOTE — Progress Notes (Signed)
 "  Gastroenterology Inpatient Follow-up Note   PATIENT IDENTIFICATION  [name] [name] is a 64 y.o. male Hospital Day: 3  SUBJECTIVE  The patient's chart has been reviewed. The patient's labs have been reviewed.  With overall stable hemogram. Today, the patient is seen while resting at the bed.   He is not very talkative today and states that he is not sure if he wants to have any further enemas or oral laxative therapies given. He denies significant abdominal pain or discomfort currently. He is upset about hemodialysis yesterday but cannot tell me why exactly. Enema administered yesterday.  With recording of 3 bowel movements yesterday total.   OBJECTIVE   Scheduled Inpatient Medications:   Chlorhexidine  Gluconate Cloth  6 each Topical Q0600   gabapentin   100 mg Oral QHS   insulin  aspart  0-6 Units Subcutaneous TID WC   levothyroxine   75 mcg Oral Q0600   midodrine   10 mg Oral TID WC   pantoprazole  (PROTONIX ) IV  40 mg Intravenous Q12H   polyethylene glycol  17 g Oral TID   senna-docusate  2 tablet Oral BID   thiamine   100 mg Oral Daily   Continuous Inpatient Infusions:   cefTRIAXone  (ROCEPHIN )  IV 2 g (07/13/24 1313)   metronidazole  500 mg (07/14/24 0035)   PRN Inpatient Medications: bisacodyl , haloperidol  lactate, ondansetron  **OR** ondansetron  (ZOFRAN ) IV   Physical Examination   Temp:  [97.4 F (36.3 C)-98.7 F (37.1 C)] 97.7 F (36.5 C) (02/01 0800) Pulse Rate:  [73-98] 77 (02/01 0800) Resp:  [12-22] 14 (02/01 0800) BP: (113-169)/(70-92) 125/73 (02/01 0800) SpO2:  [93 %-100 %] 100 % (02/01 0800) Weight:  [53.2 kg-54.7 kg] 53.2 kg (01/31 1935) Temp (24hrs), Avg:98 F (36.7 C), Min:97.4 F (36.3 C), Max:98.7 F (37.1 C)  Weight: (S) 53.2 kg (Bed Scale) GEN: Chronically ill-appearing, older than stated age, nontoxic on examination PSYCH: Cooperative, without pressured speech EYE: Conjunctivae pink, sclerae anicteric ENT: MMM CV: Nontachycardic RESP: No  audible wheezing GI: NABS, soft, TTP in lower abdomen upon deep palpation, no rebound or guarding  DRE: Deferred per patient request MSK/EXT: No significant lower extremity edema SKIN: No jaundice NEURO:  Alert & Oriented x 3, no focal deficits   Review of Data   Laboratory Studies   Recent Labs  Lab 07/08/24 0900 07/12/24 1400 07/14/24 0443  NA 138   < > 136  K 4.7   < > 4.1  CL 94*   < > 95*  CO2 25   < > 21*  BUN 45*   < > 31*  CREATININE 7.02*   < > 4.89*  GLUCOSE 58*   < > 93  CALCIUM  9.5   < > 8.6*  PHOS 5.1*  --  4.6   < > = values in this interval not displayed.   Recent Labs  Lab 07/13/24 0546  AST 18  ALT 7  ALKPHOS 84    Recent Labs  Lab 07/13/24 1457 07/13/24 2141 07/14/24 0443  WBC 13.8* 24.6* 17.7*  HGB 11.9* 13.5 12.0*  HCT 37.1* 41.3 36.9*  PLT 322 286 270   Recent Labs  Lab 07/12/24 1400  INR 1.2   Imaging Studies  CT ANGIO GI BLEED Result Date: 07/12/2024 EXAM: CTA ABDOMEN AND PELVIS WITH CONTRAST 07/12/2024 04:20:38 PM TECHNIQUE: CTA images of the abdomen and pelvis with intravenous contrast. Three-dimensional MIP/volume rendered formations were performed. Automated exposure control, iterative reconstruction, and/or weight based adjustment of the mA/kV was utilized to reduce  the radiation dose to as low as reasonably achievable. COMPARISON: Hypertension 2025. CLINICAL HISTORY: Large volume hematochezia. FINDINGS: VASCULATURE: GI BLEED: No active extravasation of contrast within the GI tract. AORTA: Moderate mixed atherosclerotic plaque within the abdominal aorta. No aneurysmal dissection. No hemodynamically significant stenosis. CELIAC TRUNK: Thin dissection within the celiac axis, which does not appear flow-limiting. No superimposed aneurysm with maximal diameter of the celiac axis at 9 mm. Extensive visceral arteriosclerosis. Slightly corrugated appearance of the celiac axis suggest changes of underlying fibromuscular dysplasia. SUPERIOR  MESENTERIC ARTERY: Extensive visceral arteriosclerosis. No acute finding. No occlusion or significant stenosis. INFERIOR MESENTERIC ARTERY: Extensive visceral arteriosclerosis. No acute finding. No occlusion or significant stenosis. RENAL ARTERIES: Extensive visceral arteriosclerosis. No acute finding. No occlusion or significant stenosis. ILIAC ARTERIES: No acute finding. No occlusion or significant stenosis. ABDOMEN/PELVIS: LOWER CHEST: Visualized portion of the lower chest demonstrates extensive calcifications of the aortic valve and mitral valve leaflets. Echocardiography may be helpful to assess the degree of valvular dysfunction. Extensive coronary artery calcifications. LIVER: The liver is unremarkable. GALLBLADDER AND BILE DUCTS: Gallbladder is unremarkable. No biliary ductal dilatation. SPLEEN: The spleen is unremarkable. 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 ADRENAL GLANDS: Bilateral adrenal glands demonstrate no acute abnormality. KIDNEYS, URETERS AND BLADDER: Simple cortical cysts within the left kidney for which no follow up imaging is recommended. No stones in the kidneys or ureters. No hydronephrosis. No perinephric or periureteral stranding. Urinary bladder is unremarkable. GI AND BOWEL: Large volume stool within the rectal wall with superimposed circumferential rectal wall thickening in keeping with fecal impaction and probable stercoral proctitis. Moderate colonic stool burden. No obstruction. Appendix normal. The stomach, small bowel, and large bowel are otherwise unremarkable. REPRODUCTIVE: Reproductive organs are unremarkable. PERITONEUM AND RETROPERITONEUM: No ascites or free air. LYMPH NODES: No lymphadenopathy. BONES AND SOFT TISSUES: Osseous structures are age appropriate. No acute bone  abnormality. No lytic or blastic bone lesion. No acute soft tissue abnormality. IMPRESSION: 1. No active gastrointestinal hemorrhage identified. 2. Fecal impaction with circumferential rectal wall thickening in keeping with stercoral proctitis; moderate colonic stool burden without obstruction. 3. 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 4. Moderate mixed atherosclerotic plaque in the abdominal aorta; extensive visceral arteriosclerosis. 5. Extensive calcifications of the aortic valve and mitral valve leaflets; echocardiography may be helpful to assess for valvular dysfunction; extensive coronary artery calcifications. 6. 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 Electronically signed by: Dorethia Molt MD 07/12/2024 05:10 PM EST RP Workstation: [id number]   GI Procedures and Studies  No new relevant studies to review   ASSESSMENT  Mr. Domine is a 64 y.o. male with a pmh significant for CHF, hypertension, diabetes, hypothyroidism, ESRD, PAD (status post recent BKA & TMA).  GI asked to evaluate in setting of rectal bleeding and history of previous rectal ulcer.  Etiology of his symptoms is most likely stercoral proctitis.  However, repeat follow-up evaluation of the area was never performed even though pathology was negative.  In the setting of patient needing antiplatelet therapy due to recent PAD needs, it makes sense for us  to consider the role of reevaluation of this area.  However we need to try to clear the rectum as much as possible  so we will continue aggressive therapy from above with administered p.o. laxatives as well as PR smog enema.  It is not clear how much and how aggressive this patient will continue to allow us  to be, if he gets a point where he defers having any enemas or anything placed  in his rectum, then we can only do what a patient will allow us  to do.  Flexible sigmoidoscopy even if we get permission from patient's mother would not necessarily help with completion clear out of this without some further attempt preoperatively or preprocedurally.  Will see how he does today and our team will reevaluate tomorrow for the consideration of needs of flexible sigmoidoscopy versus colonoscopy.   PLAN/RECOMMENDATIONS  Smog enema x 1 today Continue Senokot 2 times daily Dulcolax 10 mg once today Transitioned IV PPI to p.o. PPI KUB tomorrow morning to evaluate if progressive dilation of the rectum Team will reevaluate consideration of flexible sigmoidoscopy versus colonoscopy needs   Please page/call with questions or concerns.   Aloha Finner, MD [facility] Office # [phone]    LOS: 2 days  Aloha Finner Raddle  07/14/2024, 9:17 AM  "

## 2024-07-14 NOTE — Progress Notes (Addendum)
 "                        PROGRESS NOTE        PATIENT DETAILS Name: [name] [name] Age: 64 y.o. Sex: male Date of Birth: 1961-02-07 Admit Date: 07/12/2024 Admitting Physician Emery LITTIE Fuss, MD ERE:Fnmmnt, Beverley, MD  Brief Summary: Patient is a 64 y.o.  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  Significant events: 1/30>> admit to [facility].  Significant studies: 1/30>> CT angio GI bleed: No active bleed-fecal impaction with stercoral proctitis.  Significant microbiology data: None  Procedures: None  Consults: GI Nephrology  Subjective: No major issues overnight-per nursing staff-he had several BMs yesterday.  Apparently had several hematochezia overnight as well-on my exam-he has fresh hematochezia this morning as well.  Objective: Vitals: Blood pressure 113/70, pulse 79, temperature 97.8 F (36.6 C), temperature source Oral, resp. rate 14, height 5' 10 (1.778 m), weight (S) 53.2 kg, SpO2 99%.   Exam: Awake/alert-not in any distress Chest: Clear to auscultation Abdomen: Soft nontender nondistended. Left TMA-bandaged Right BKA-Staples intact.   Nonfocal exam.   Pertinent Labs/Radiology:    Latest Ref Rng & Units 07/14/2024    4:43 AM 07/13/2024    9:41 PM 07/13/2024    2:57 PM  CBC  WBC 4.0 - 10.5 K/uL 17.7  24.6  13.8   Hemoglobin 13.0 - 17.0 g/dL 87.9  86.4  88.0   Hematocrit 39.0 - 52.0 % 36.9  41.3  37.1   Platelets 150 - 400 K/uL 270  286  322     Lab Results  Component Value Date   NA 136 07/14/2024   K 4.1 07/14/2024   CL 95 (L) 07/14/2024   CO2 21 (L) 07/14/2024      Assessment/Plan: Lower GI bleed with ABLA Either diverticular bleed or secondary to stercoral colitis Continues to have hematochezia S/p 2 units of PRBC on 1/31 Hb stable at 12.0 this  morning Continue to follow CBC GI following  Stercoral colitis Significant stool burden on CT with fecal impaction Had numerous BMs per nursing staff yesterday Continue MiraLAX tricia Continue Rocephin /Flagyl  MiraLAX /senna  ESRD on HD MWF Nephrology following and directing HD care.  Multifactorial anemia Secondary to blood loss-ESRD See above  PAD-s/p right BKA-left TMA Antiplatelets on hold due to GI bleeding  Chronic combined systolic and diastolic heart failure Euvolemic Volume removal with HD  HTN BP soft Continue to hold all antihypertensive-Will start midodrine  for BP support-as patient will require HD later today.  HLD Statin  GERD PPI  Hypothyroidism Synthroid   DM-2 (A1c 4.6 in 11/13) CBG stable Start extra sensitive SSI  ?  Cognitive issues/poor compliance Uncooperative-has been refusing blood work-refusing multiple meds-initially refused transfusion but then consent obtained after talking to the patient's mother who gave consent and then subsequently spoke with the patient who then was agreeable.   Code status:   Code Status: Limited: Do not attempt resuscitation (DNR) -DNR-LIMITED -Do Not Intubate/DNI    DVT Prophylaxis: SCDs Start: 07/12/24 2207   Family Communication: Mother-Bertha [phone]-updated-2/1  Disposition Plan: Status is: Inpatient Remains inpatient appropriate because: Severity of illness   Planned Discharge Destination:Home health   Diet: Diet Order             Diet full liquid Room service appropriate? Yes; Fluid  consistency: Thin  Diet effective now                     Antimicrobial agents: Anti-infectives (From admission, onward)    Start     Dose/Rate Route Frequency Ordered Stop   07/13/24 1100  cefTRIAXone  (ROCEPHIN ) 2 g in sodium chloride  0.9 % 100 mL IVPB        2 g 200 mL/hr over 30 Minutes Intravenous Every 24 hours 07/13/24 1012     07/13/24 1100  metroNIDAZOLE  (FLAGYL ) IVPB 500 mg         500 mg 100 mL/hr over 60 Minutes Intravenous Every 12 hours 07/13/24 1012          MEDICATIONS: Scheduled Meds:  Chlorhexidine  Gluconate Cloth  6 each Topical Q0600   gabapentin   100 mg Oral QHS   insulin  aspart  0-6 Units Subcutaneous TID WC   levothyroxine   75 mcg Oral Q0600   midodrine   10 mg Oral TID WC   pantoprazole  (PROTONIX ) IV  40 mg Intravenous Q12H   polyethylene glycol  17 g Oral TID   senna-docusate  2 tablet Oral BID   thiamine   100 mg Oral Daily   Continuous Infusions:  cefTRIAXone  (ROCEPHIN )  IV 2 g (07/13/24 1313)   metronidazole  500 mg (07/14/24 0035)   PRN Meds:.bisacodyl , haloperidol  lactate, ondansetron  **OR** ondansetron  (ZOFRAN ) IV   I have personally reviewed following labs and imaging studies  LABORATORY DATA: CBC: Recent Labs  Lab 07/12/24 1400 07/12/24 2239 07/13/24 0546 07/13/24 1457 07/13/24 2141 07/14/24 0443  WBC 11.2* 13.0* 12.1* 13.8* 24.6* 17.7*  NEUTROABS 9.2*  --   --   --   --   --   HGB 13.3 11.4* 9.8* 11.9* 13.5 12.0*  HCT 43.8 37.8* 31.9* 37.1* 41.3 36.9*  MCV 94.8 93.8 93.8 89.0 86.8 86.2  PLT 311 344 303 322 286 270    Basic Metabolic Panel: Recent Labs  Lab 07/08/24 0900 07/12/24 1400 07/13/24 0546 07/14/24 0443  NA 138 142 138 136  K 4.7 4.7 5.2* 4.1  CL 94* 99 99 95*  CO2 25 24 21* 21*  GLUCOSE 58* 121* 110* 93  BUN 45* 42* 50* 31*  CREATININE 7.02* 7.18* 8.08* 4.89*  CALCIUM  9.5 9.9 9.1 8.6*  PHOS 5.1*  --   --  4.6    GFR: Estimated Creatinine Clearance: 11.3 mL/min (A) (by C-G formula based on SCr of 4.89 mg/dL (H)).  Liver Function Tests: Recent Labs  Lab 07/08/24 0900 07/12/24 1400 07/13/24 0546 07/14/24 0443  AST  --  24 18  --   ALT  --  7 7  --   ALKPHOS  --  106 84  --   BILITOT  --  0.5 0.4  --   PROT  --  7.8 6.4*  --   ALBUMIN  3.5 3.7 3.1* 3.4*   No results for input(s): LIPASE, AMYLASE in the last 168 hours. No results for input(s): AMMONIA in the last 168  hours.  Coagulation Profile: Recent Labs  Lab 07/12/24 1400  INR 1.2    Cardiac Enzymes: No results for input(s): CKTOTAL, CKMB, CKMBINDEX, TROPONINI in the last 168 hours.  BNP (last 3 results) No results for input(s): PROBNP in the last 8760 hours.  Lipid Profile: No results for input(s): CHOL, HDL, LDLCALC, TRIG, CHOLHDL, LDLDIRECT in the last 72 hours.  Thyroid  Function Tests: No results for input(s): TSH, T4TOTAL, FREET4, T3FREE, THYROIDAB in the last 72 hours.  Anemia Panel: No results for input(s): VITAMINB12, FOLATE, FERRITIN, TIBC, IRON, RETICCTPCT in the last 72 hours.  Urine analysis:    Component Value Date/Time   COLORURINE AMBER (A) 03/06/2023 1036   APPEARANCEUR CLOUDY (A) 03/06/2023 1036   LABSPEC 1.019 03/06/2023 1036   PHURINE 5.0 03/06/2023 1036   GLUCOSEU NEGATIVE 03/06/2023 1036   HGBUR LARGE (A) 03/06/2023 1036   BILIRUBINUR NEGATIVE 03/06/2023 1036   KETONESUR NEGATIVE 03/06/2023 1036   PROTEINUR >=300 (A) 03/06/2023 1036   NITRITE NEGATIVE 03/06/2023 1036   LEUKOCYTESUR MODERATE (A) 03/06/2023 1036    Sepsis Labs: Lactic Acid, Venous    Component Value Date/Time   LATICACIDVEN 1.0 05/13/2024 1610    MICROBIOLOGY: No results found for this or any previous visit (from the past 240 hours).  RADIOLOGY STUDIES/RESULTS: CT ANGIO GI BLEED Result Date: 07/12/2024 EXAM: CTA ABDOMEN AND PELVIS WITH CONTRAST 07/12/2024 04:20:38 PM TECHNIQUE: CTA images of the abdomen and pelvis with intravenous contrast. Three-dimensional MIP/volume rendered formations were performed. Automated exposure control, iterative reconstruction, and/or weight based adjustment of the mA/kV was utilized to reduce the radiation dose to as low as reasonably achievable. COMPARISON: Hypertension 2025. CLINICAL HISTORY: Large volume hematochezia. FINDINGS: VASCULATURE: GI BLEED: No active extravasation of contrast within the GI tract.  AORTA: Moderate mixed atherosclerotic plaque within the abdominal aorta. No aneurysmal dissection. No hemodynamically significant stenosis. CELIAC TRUNK: Thin dissection within the celiac axis, which does not appear flow-limiting. No superimposed aneurysm with maximal diameter of the celiac axis at 9 mm. Extensive visceral arteriosclerosis. Slightly corrugated appearance of the celiac axis suggest changes of underlying fibromuscular dysplasia. SUPERIOR MESENTERIC ARTERY: Extensive visceral arteriosclerosis. No acute finding. No occlusion or significant stenosis. INFERIOR MESENTERIC ARTERY: Extensive visceral arteriosclerosis. No acute finding. No occlusion or significant stenosis. RENAL ARTERIES: Extensive visceral arteriosclerosis. No acute finding. No occlusion or significant stenosis. ILIAC ARTERIES: No acute finding. No occlusion or significant stenosis. ABDOMEN/PELVIS: LOWER CHEST: Visualized portion of the lower chest demonstrates extensive calcifications of the aortic valve and mitral valve leaflets. Echocardiography may be helpful to assess the degree of valvular dysfunction. Extensive coronary artery calcifications. LIVER: The liver is unremarkable. GALLBLADDER AND BILE DUCTS: Gallbladder is unremarkable. No biliary ductal dilatation. SPLEEN: The spleen is unremarkable. 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 ADRENAL GLANDS: Bilateral adrenal glands demonstrate no acute abnormality. KIDNEYS, URETERS AND BLADDER: Simple cortical cysts within the left kidney for which no follow up imaging is recommended. No stones in the kidneys or ureters. No hydronephrosis. No perinephric or periureteral stranding. Urinary bladder is unremarkable. GI AND BOWEL: Large volume stool within the rectal wall with superimposed  circumferential rectal wall thickening in keeping with fecal impaction and probable stercoral proctitis. Moderate colonic stool burden. No obstruction. Appendix normal. The stomach, small bowel, and large bowel are otherwise unremarkable. REPRODUCTIVE: Reproductive organs are unremarkable. PERITONEUM AND RETROPERITONEUM: No ascites or free air. LYMPH NODES: No lymphadenopathy. BONES AND SOFT TISSUES: Osseous structures are age appropriate. No acute bone abnormality. No lytic or blastic bone lesion. No acute soft tissue abnormality. IMPRESSION: 1. No active gastrointestinal hemorrhage identified. 2. Fecal impaction with circumferential rectal wall thickening in keeping with stercoral proctitis; moderate colonic stool burden without obstruction. 3. 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 4. Moderate mixed  atherosclerotic plaque in the abdominal aorta; extensive visceral arteriosclerosis. 5. Extensive calcifications of the aortic valve and mitral valve leaflets; echocardiography may be helpful to assess for valvular dysfunction; extensive coronary artery calcifications. 6. 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 Electronically signed by: Dorethia Molt MD 07/12/2024 05:10 PM EST RP Workstation: [id number]     LOS: 2 days   Donalda Applebaum, MD  [facility]    To contact the attending provider between 7A-7P or the covering provider during after hours 7P-7A, please log into the web site [email or web address] and access using universal [place] password for that web site. If you do not have the password, please call the hospital operator.  07/14/2024, 8:38 AM    "

## 2024-07-14 NOTE — Progress Notes (Signed)
 [facility] NEPHROLOGY PROGRESS NOTE  Assessment/ Plan: Pt is a 64 y.o. yo male  with ESRD on HD, T2DM, HTN, PAD who was recently discharged from [facility] after bilateral LE amputations, now admitted with rectal bleeding.  Dialysis Orders:  NW MWF Time: 4:00 EDW: 62 kg (post HD wt 53.8 kg  on 1/28)  Flows: 400/A1.5x Bath: 3K/2.5Ca  Access: TDC Heparin : None ESA: None VDRA: None   # Lower GI bleed due to diverticular bleed are secondary to stercoral colitis.  Received units of blood transfusion, GI is following.  # ESRD MWF, status post dialysis off schedule yesterday with 1 L UF, tolerated well.  We will plan for regular dialysis tomorrow.  # Anemia of CKD, blood loss: Received blood transfusion with improvement of hemoglobin.  # CKD-MBD, secondary hyperparathyroidism: Monitor lab.  # HTN/volume: Blood pressure and volume status acceptable.   Subjective: Seen and examined at bedside.  Tolerated dialysis well.  No GI bleed.  Denies nausea, vomiting, chest pain.  He looks somnolent. Objective Vital signs in last 24 hours: Vitals:   07/14/24 0105 07/14/24 0400 07/14/24 0800 07/14/24 1124  BP: 131/75 113/70 125/73 119/80  Pulse: 86 79 77 79  Resp: 18 14 14 15   Temp: 98.3 F (36.8 C) 97.8 F (36.6 C) 97.7 F (36.5 C) (!) 97.4 F (36.3 C)  TempSrc: Oral Oral Axillary Axillary  SpO2: 100% 99% 100% 99%  Weight:      Height:       Weight change: 4.804 kg  Intake/Output Summary (Last 24 hours) at 07/14/2024 1157 Last data filed at 07/13/2024 1935 Gross per 24 hour  Intake 486 ml  Output 1000 ml  Net -514 ml       Labs: RENAL PANEL Recent Labs  Lab 07/08/24 0900 07/12/24 1400 07/13/24 0546 07/14/24 0443  NA 138 142 138 136  K 4.7 4.7 5.2* 4.1  CL 94* 99 99 95*  CO2 25 24 21* 21*  GLUCOSE 58* 121* 110* 93  BUN 45* 42* 50* 31*  CREATININE 7.02* 7.18* 8.08* 4.89*  CALCIUM  9.5 9.9 9.1 8.6*  PHOS 5.1*  --   --  4.6  ALBUMIN  3.5 3.7 3.1* 3.4*     Liver Function Tests: Recent Labs  Lab 07/12/24 1400 07/13/24 0546 07/14/24 0443  AST 24 18  --   ALT 7 7  --   ALKPHOS 106 84  --   BILITOT 0.5 0.4  --   PROT 7.8 6.4*  --   ALBUMIN  3.7 3.1* 3.4*   No results for input(s): LIPASE, AMYLASE in the last 168 hours. No results for input(s): AMMONIA in the last 168 hours. CBC: Recent Labs    07/12/24 2239 07/13/24 0546 07/13/24 1457 07/13/24 2141 07/14/24 0443  HGB 11.4* 9.8* 11.9* 13.5 12.0*  MCV 93.8 93.8 89.0 86.8 86.2    Cardiac Enzymes: No results for input(s): CKTOTAL, CKMB, CKMBINDEX, TROPONINI in the last 168 hours. CBG: Recent Labs  Lab 07/13/24 1152 07/13/24 2131 07/14/24 0813 07/14/24 1150  GLUCAP 124* 132* 78 82    Iron Studies: No results for input(s): IRON, TIBC, TRANSFERRIN, FERRITIN in the last 72 hours. Studies/Results: CT ANGIO GI BLEED Result Date: 07/12/2024 EXAM: CTA ABDOMEN AND PELVIS WITH CONTRAST 07/12/2024 04:20:38 PM TECHNIQUE: CTA images of the abdomen and pelvis with intravenous contrast. Three-dimensional MIP/volume rendered formations were performed. Automated exposure control, iterative reconstruction, and/or weight based adjustment of the mA/kV was utilized to reduce the radiation dose to as low as  reasonably achievable. COMPARISON: Hypertension 2025. CLINICAL HISTORY: Large volume hematochezia. FINDINGS: VASCULATURE: GI BLEED: No active extravasation of contrast within the GI tract. AORTA: Moderate mixed atherosclerotic plaque within the abdominal aorta. No aneurysmal dissection. No hemodynamically significant stenosis. CELIAC TRUNK: Thin dissection within the celiac axis, which does not appear flow-limiting. No superimposed aneurysm with maximal diameter of the celiac axis at 9 mm. Extensive visceral arteriosclerosis. Slightly corrugated appearance of the celiac axis suggest changes of underlying fibromuscular dysplasia. SUPERIOR MESENTERIC ARTERY: Extensive visceral  arteriosclerosis. No acute finding. No occlusion or significant stenosis. INFERIOR MESENTERIC ARTERY: Extensive visceral arteriosclerosis. No acute finding. No occlusion or significant stenosis. RENAL ARTERIES: Extensive visceral arteriosclerosis. No acute finding. No occlusion or significant stenosis. ILIAC ARTERIES: No acute finding. No occlusion or significant stenosis. ABDOMEN/PELVIS: LOWER CHEST: Visualized portion of the lower chest demonstrates extensive calcifications of the aortic valve and mitral valve leaflets. Echocardiography may be helpful to assess the degree of valvular dysfunction. Extensive coronary artery calcifications. LIVER: The liver is unremarkable. GALLBLADDER AND BILE DUCTS: Gallbladder is unremarkable. No biliary ductal dilatation. SPLEEN: The spleen is unremarkable. 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 ADRENAL GLANDS: Bilateral adrenal glands demonstrate no acute abnormality. KIDNEYS, URETERS AND BLADDER: Simple cortical cysts within the left kidney for which no follow up imaging is recommended. No stones in the kidneys or ureters. No hydronephrosis. No perinephric or periureteral stranding. Urinary bladder is unremarkable. GI AND BOWEL: Large volume stool within the rectal wall with superimposed circumferential rectal wall thickening in keeping with fecal impaction and probable stercoral proctitis. Moderate colonic stool burden. No obstruction. Appendix normal. The stomach, small bowel, and large bowel are otherwise unremarkable. REPRODUCTIVE: Reproductive organs are unremarkable. PERITONEUM AND RETROPERITONEUM: No ascites or free air. LYMPH NODES: No lymphadenopathy. BONES AND SOFT TISSUES: Osseous structures are age appropriate. No acute bone abnormality. No lytic or blastic bone  lesion. No acute soft tissue abnormality. IMPRESSION: 1. No active gastrointestinal hemorrhage identified. 2. Fecal impaction with circumferential rectal wall thickening in keeping with stercoral proctitis; moderate colonic stool burden without obstruction. 3. 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 4. Moderate mixed atherosclerotic plaque in the abdominal aorta; extensive visceral arteriosclerosis. 5. Extensive calcifications of the aortic valve and mitral valve leaflets; echocardiography may be helpful to assess for valvular dysfunction; extensive coronary artery calcifications. 6. 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 Electronically signed by: Dorethia Molt MD 07/12/2024 05:10 PM EST RP Workstation: [id number]    Medications: Infusions:  cefTRIAXone  (ROCEPHIN )  IV 2 g (07/14/24 1131)   metronidazole  500 mg (07/14/24 1133)    Scheduled Medications:  Chlorhexidine  Gluconate Cloth  6 each Topical Q0600   gabapentin   100 mg Oral QHS   insulin  aspart  0-6 Units Subcutaneous TID WC   levothyroxine   75 mcg Oral Q0600   midodrine   10 mg Oral TID WC   pantoprazole   40 mg Oral BID   polyethylene glycol  17 g Oral TID   senna-docusate  2 tablet Oral BID   SMOG  960 mL Rectal Once   thiamine   100 mg Oral Daily    have reviewed scheduled and prn medications.  Physical Exam: General: Somnolent male lying on bed comfortable. Heart:RRR, s1s2  nl Lungs:clear b/l, no crackle Abdomen:soft, Non-tender, non-distended Extremities:No edema Dialysis Access: TDC in place  Takenya Travaglini [employer] 07/14/2024,11:57 AM  LOS: 2 days

## 2024-07-14 NOTE — Plan of Care (Signed)

## 2024-07-15 ENCOUNTER — Inpatient Hospital Stay (HOSPITAL_COMMUNITY)

## 2024-07-15 DIAGNOSIS — D638 Anemia in other chronic diseases classified elsewhere: Secondary | ICD-10-CM

## 2024-07-15 DIAGNOSIS — K625 Hemorrhage of anus and rectum: Secondary | ICD-10-CM | POA: Diagnosis not present

## 2024-07-15 DIAGNOSIS — I739 Peripheral vascular disease, unspecified: Secondary | ICD-10-CM

## 2024-07-15 DIAGNOSIS — N186 End stage renal disease: Secondary | ICD-10-CM | POA: Diagnosis not present

## 2024-07-15 DIAGNOSIS — K5641 Fecal impaction: Secondary | ICD-10-CM

## 2024-07-15 LAB — CBC
HCT: 36.2 % — ABNORMAL LOW (ref 39.0–52.0)
HCT: 41 % (ref 39.0–52.0)
HCT: 37.7 % — ABNORMAL LOW (ref 39.0–52.0)
Hemoglobin: 11.8 g/dL — ABNORMAL LOW (ref 13.0–17.0)
Hemoglobin: 13.3 g/dL (ref 13.0–17.0)
Hemoglobin: 12.1 g/dL — ABNORMAL LOW (ref 13.0–17.0)
MCH: 28 pg (ref 26.0–34.0)
MCH: 28.5 pg (ref 26.0–34.0)
MCH: 28.1 pg (ref 26.0–34.0)
MCHC: 32.6 g/dL (ref 30.0–36.0)
MCHC: 32.4 g/dL (ref 30.0–36.0)
MCHC: 32.1 g/dL (ref 30.0–36.0)
MCV: 86 fL (ref 80.0–100.0)
MCV: 88 fL (ref 80.0–100.0)
MCV: 87.5 fL (ref 80.0–100.0)
Platelets: 247 10*3/uL (ref 150–400)
Platelets: 272 10*3/uL (ref 150–400)
Platelets: 263 10*3/uL (ref 150–400)
RBC: 4.21 MIL/uL — ABNORMAL LOW (ref 4.22–5.81)
RBC: 4.66 MIL/uL (ref 4.22–5.81)
RBC: 4.31 MIL/uL (ref 4.22–5.81)
RDW: 17.4 % — ABNORMAL HIGH (ref 11.5–15.5)
RDW: 17.3 % — ABNORMAL HIGH (ref 11.5–15.5)
RDW: 17.2 % — ABNORMAL HIGH (ref 11.5–15.5)
WBC: 17.2 10*3/uL — ABNORMAL HIGH (ref 4.0–10.5)
WBC: 16.5 10*3/uL — ABNORMAL HIGH (ref 4.0–10.5)
WBC: 15.4 10*3/uL — ABNORMAL HIGH (ref 4.0–10.5)
nRBC: 0 % (ref 0.0–0.2)
nRBC: 0 % (ref 0.0–0.2)
nRBC: 0 % (ref 0.0–0.2)

## 2024-07-15 LAB — RENAL FUNCTION PANEL
Albumin: 3.2 g/dL — ABNORMAL LOW (ref 3.5–5.0)
Albumin: 3.5 g/dL (ref 3.5–5.0)
Anion gap: 17 — ABNORMAL HIGH (ref 5–15)
Anion gap: 17 — ABNORMAL HIGH (ref 5–15)
BUN: 45 mg/dL — ABNORMAL HIGH (ref 8–23)
BUN: 47 mg/dL — ABNORMAL HIGH (ref 8–23)
CO2: 22 mmol/L (ref 22–32)
CO2: 24 mmol/L (ref 22–32)
Calcium: 8.9 mg/dL (ref 8.9–10.3)
Calcium: 9 mg/dL (ref 8.9–10.3)
Chloride: 96 mmol/L — ABNORMAL LOW (ref 98–111)
Chloride: 94 mmol/L — ABNORMAL LOW (ref 98–111)
Creatinine, Ser: 6.33 mg/dL — ABNORMAL HIGH (ref 0.61–1.24)
Creatinine, Ser: 6.75 mg/dL — ABNORMAL HIGH (ref 0.61–1.24)
GFR, Estimated: 9 mL/min — ABNORMAL LOW
GFR, Estimated: 8 mL/min — ABNORMAL LOW
Glucose, Bld: 106 mg/dL — ABNORMAL HIGH (ref 70–99)
Glucose, Bld: 221 mg/dL — ABNORMAL HIGH (ref 70–99)
Phosphorus: 5.3 mg/dL — ABNORMAL HIGH (ref 2.5–4.6)
Phosphorus: 5.4 mg/dL — ABNORMAL HIGH (ref 2.5–4.6)
Potassium: 4 mmol/L (ref 3.5–5.1)
Potassium: 4.3 mmol/L (ref 3.5–5.1)
Sodium: 135 mmol/L (ref 135–145)
Sodium: 135 mmol/L (ref 135–145)

## 2024-07-15 LAB — GLUCOSE, CAPILLARY
Glucose-Capillary: 99 mg/dL (ref 70–99)
Glucose-Capillary: 192 mg/dL — ABNORMAL HIGH (ref 70–99)
Glucose-Capillary: 207 mg/dL — ABNORMAL HIGH (ref 70–99)
Glucose-Capillary: 111 mg/dL — ABNORMAL HIGH (ref 70–99)

## 2024-07-15 MED ORDER — HEPARIN SODIUM (PORCINE) 1000 UNIT/ML DIALYSIS
1000.0000 [IU] | INTRAMUSCULAR | Status: DC | PRN
  Administered 2024-07-15: 3200 [IU]

## 2024-07-15 MED ORDER — ALTEPLASE 2 MG IJ SOLR: 2.0000 mg | Freq: Once | INTRAMUSCULAR | Status: DC | PRN

## 2024-07-15 MED ORDER — PENTAFLUOROPROP-TETRAFLUOROETH EX AERO: 1.0000 | INHALATION_SPRAY | CUTANEOUS | Status: DC | PRN

## 2024-07-15 MED ORDER — LIDOCAINE HCL (PF) 1 % IJ SOLN: 5.0000 mL | INTRAMUSCULAR | Status: DC | PRN

## 2024-07-15 MED ORDER — BISACODYL 5 MG PO TBEC
20.0000 mg | DELAYED_RELEASE_TABLET | Freq: Once | ORAL | Status: AC
  Administered 2024-07-15: 20 mg via ORAL
  Filled 2024-07-15: qty 4

## 2024-07-15 MED ORDER — LIDOCAINE-PRILOCAINE 2.5-2.5 % EX CREA: 1.0000 | TOPICAL_CREAM | CUTANEOUS | Status: DC | PRN

## 2024-07-15 MED ORDER — POLYETHYLENE GLYCOL 3350 17 GM/SCOOP PO POWD
119.0000 g | Freq: Once | ORAL | Status: AC
  Administered 2024-07-15: 119 g via ORAL
  Filled 2024-07-15: qty 119

## 2024-07-15 MED ORDER — HEPARIN SODIUM (PORCINE) 1000 UNIT/ML IJ SOLN
INTRAMUSCULAR | Status: AC
  Filled 2024-07-15: qty 4

## 2024-07-15 MED ORDER — SODIUM CHLORIDE 0.9 % IV SOLN: INTRAVENOUS | Status: AC

## 2024-07-15 MED ORDER — ANTICOAGULANT SODIUM CITRATE 4% (200MG/5ML) IV SOLN: 5.0000 mL | Status: DC | PRN

## 2024-07-15 MED ORDER — METOCLOPRAMIDE HCL 5 MG/ML IJ SOLN
10.0000 mg | Freq: Once | INTRAMUSCULAR | Status: AC
  Administered 2024-07-15: 10 mg via INTRAVENOUS
  Filled 2024-07-15: qty 2

## 2024-07-15 NOTE — TOC Initial Note (Signed)
 [name]) - Initial/Assessment Note    Patient Details  Name: [name] MRN: [id number] Date of Birth: 06/08/1961  [name] CM/SW Contact:    [name] DELENA Senters, RN Phone Number: 07/15/2024, 12:47 PM  Clinical Narrative:                 RR:[id number] history significant of end-stage renal disease on hemodialysis Mondays Wednesdays and Fridays, osteoarthritis, type 2 diabetes, essential hypertension, GERD, hyperlipidemia, hypothyroidism, peripheral vascular disease, pulmonary hypertension who presented to the ER with episode of rectal bleed.   Patient lives with his mother. Patient is confused at this time so CM called mother for home hx.   Patient has PCP, mother manages medications, DME reviewed-electric WC, manual WC, BSC. Patient [facility]pt HD at [name], MWF, mother drives him to appts.   [name]. Patient's mother reports she wants him to return there for STR if needed, does not want to consider other facilities.   Continued medical workup.  ICM will continue to follow.     Expected Discharge Plan:  (TBD) Barriers to Discharge: Continued Medical Work up   Patient Goals and CMS Choice            Expected Discharge Plan and Services       Living arrangements for the past 2 months: Single Family Home                                      Prior Living Arrangements/Services Living arrangements for the past 2 months: Single Family Home Lives with:: Self, Parents Patient language and need for interpreter reviewed:: Yes Do you feel safe going back to the place where you live?: Yes      Need for Family Participation in Patient Care: Yes (Comment) Care giver support system in place?: Yes (comment) Current home services: DME (electric WC, manual WC, BSC) Criminal Activity/Legal Involvement Pertinent to Current Situation/Hospitalization: No - Comment as  needed  Activities of Daily Living      Permission Sought/Granted                  Emotional Assessment Appearance:: Developmentally appropriate Attitude/Demeanor/Rapport: Engaged Affect (typically observed): Calm Orientation: : Oriented to Self, Oriented to Place, Fluctuating Orientation (Suspected and/or reported Sundowners) Alcohol / Substance Use: Not Applicable Psych Involvement: No (comment)  Admission diagnosis:  Hematochezia [K92.1] Rectal bleed [K62.5] Patient Active Problem List   Diagnosis Date Noted   Stercoral ulcer of rectum 07/14/2024   Abnormal CT scan, colon 07/14/2024   Long term (current) use of antithrombotics/antiplatelets 07/14/2024   Hematochezia 07/13/2024   Stercoral colitis 07/13/2024   History of rectal ulcer 07/13/2024   Rectal bleed 07/12/2024   Gangrene of foot (HCC) 06/23/2024   Gangrene of right foot (HCC) 04/26/2024   PAD (peripheral artery disease) 04/25/2024   Gangrene of left foot (HCC) 03/25/2024   Acute osteomyelitis of left ankle or foot (HCC) 03/25/2024   Wound dehiscence, surgical 03/20/2024   Postoperative wound infection 03/19/2024   ESRD on dialysis (HCC) 03/19/2024   PVD (peripheral vascular disease) 03/19/2024   Thrombocytopenia 03/19/2024   Controlled type 2 diabetes mellitus without complication, without long-term current use of insulin  (HCC) 03/19/2024   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   Liver lesion 09/05/2023   Chronic idiopathic constipation 09/05/2023   Hemorrhoids  09/05/2023   Dyspepsia 06/01/2023   Dysphagia 06/01/2023   Hyperkalemia 05/19/2023   Type 2 diabetes mellitus (HCC) 03/29/2023   Infection of AV graft for dialysis 03/27/2023   MRSA bacteremia 03/21/2023   Enterococcal bacteremia 03/21/2023   Hemodialysis catheter infection 03/21/2023   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   Delirium 03/21/2023   Protein-calorie malnutrition, severe 03/16/2023   Acute metabolic  encephalopathy 03/06/2023   Rectal bleeding 01/14/2023   Acute blood loss anemia 01/14/2023   Hypokalemia 01/12/2023   Proctitis 01/12/2023   Hypothyroidism 01/12/2023   GERD (gastroesophageal reflux disease) 01/12/2023   ESRD (end stage renal disease) (HCC) 01/12/2023   GI bleed 01/11/2023   Anemia in chronic kidney disease 10/04/2022   End stage renal disease (HCC) 10/04/2022   Anemia of chronic renal failure 06/09/2022   Pulmonary hypertension, unspecified (HCC) 07/20/2021   Stage 3 chronic kidney disease (HCC) 11/27/2020   Acute on chronic combined systolic and diastolic CHF (congestive heart failure) (HCC) 09/09/2020   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   Anemia of chronic disease 09/09/2020   Acute respiratory failure with hypoxia (HCC) 09/09/2020   Anasarca 08/28/2020   Nephrotic syndrome 08/28/2020   Dilated cardiomyopathy (HCC) 08/17/2020   Neuropathy 07/12/2018   Pyogenic inflammation of bone (HCC) 07/12/2018   Essential hypertension 02/14/2018   Uncontrolled type 2 diabetes mellitus with hyperglycemia (HCC) 01/08/2018   Hyperlipidemia associated with type 2 diabetes mellitus (HCC) 01/08/2018   PCP:  [name] Righter, MD Pharmacy:   [name] [address], [place] - [address] [place] [address] Phone: [phone] Fax: [phone]  [name], [place] - [address] [place] [address] [place] [address] Phone: [phone] Fax: [phone]  [name] [address] [place] [address] Phone: [phone] Fax: [phone]     Social Drivers of Health (SDOH) Social History: SDOH Screenings   Food Insecurity: No Food Insecurity (07/14/2024)  Housing: Low Risk (07/14/2024)  Transportation Needs: No Transportation Needs (07/14/2024)  Utilities: Not At Risk (07/14/2024)  Social Connections: Socially Isolated  (07/14/2024)  Tobacco Use: Low Risk (07/12/2024)   SDOH Interventions: Social Connections Interventions: Community Resources Provided, Inpatient TOC   Readmission Risk Interventions    03/28/2024    1:14 PM 03/29/2023    1:45 PM 03/07/2023    1:31 PM  Readmission Risk Prevention Plan  Transportation Screening Complete Complete Complete  HRI or Home Care Consult   Complete  Palliative Care Screening   Not Applicable  Medication Review (RN Care Manager) Complete Complete Complete  PCP or Specialist appointment within 3-5 days of discharge  Complete   HRI or Home Care Consult Complete Complete   SW Recovery Care/Counseling Consult Patient refused Complete   Palliative Care Screening Complete Complete   Skilled Nursing Facility Patient Refused Complete

## 2024-07-15 NOTE — Progress Notes (Signed)
"  °  Subjective:  Patient ID: [name], male    DOB: December 10, 1960,  MRN: [id number]  Chief Complaint  Patient presents with   Bloody Stool    DOS: 06/25/2024 Procedure: 1. Revision left foot transmetatarsal to lisfranc amputation   64 y.o. male seen for post op check.  He was readmitted for GI bleed, asked by Dr. Raenelle to eval his left foot amputation site.   Review of Systems: Negative except as noted in the HPI. Denies N/V/F/Ch.   Objective:   Constitutional Well developed. Well nourished.  Vascular Foot warm and well perfused. Capillary refill normal to all digits.   No calf pain with palpation  Neurologic Normal speech. Oriented to person, place, and time. Epicritic sensation diminished to left foot  Dermatologic Amputation site well coapted no drainage some evidence of mild dusky tissue laterally though no full thickness necrosis or dehiscence         Orthopedic: Status post left foot Lisfranc amputation   Radiographs: Amputation of the foot distal to the tarsal bones.   Pathology:  A. FOOT, LEFT, TRANSMETATARSAL AMPUTATION:  Skin ulceration with marked subcutaneous acute inflammation.  Chronic osteomyelitis.  No evidence of malignancy.   Micro: MRSA  Assessment:   1. Hematochezia   Status post left foot revision TMA to Lisfranc amputation  Plan:  Patient was evaluated and treated and all questions answered.  3 weeks s/p left foot Lisfranc amputation -Progressing overall well, mild necrotic changes seen at lateral amputation site though no indication for any intervention at this time, continue to allow healing/demarcation and decision on weather liimb will be slavageable will be made on outpatient basis as we continue monitor his healing progress. -Status post right lower extremity below-knee amputation -XR: Expected postoperative findings -WB Status: Nonweightbearing in soft dressing.  Recommend Prevalon boot for the left side while in bed -Sutures:  Leave sutures and staples intact another 2 weeks -Medications/ABX: Do not see indication for abx for left foot amp site at this time -Dressing: Changed today and placed new dry gauze 4 x 4 Kerlix wrap dressing to the left foot.  Recommend twice weekly dressing changes with the same Monday and Thursday.  Orders entered. - F/u Plan: Patient will follow-up in 2 weeks in the office, office to call to arrange, will sign off         Marolyn JULIANNA Honour, DPM [facility] / [facility]  "

## 2024-07-15 NOTE — NC FL2 (Signed)
 " [place]  MEDICAID FL2 LEVEL OF CARE FORM     IDENTIFICATION  Patient Name: [name] Birthdate: 09/28/1960 Sex: male Admission Date (Current Location): 07/12/2024  [facility] and [place] Number:  Producer, Television/film/video and Address:  [place], [address] [place], [place] [address]      Provider Number: [id number]  Attending Physician Name and Address:  Raenelle Donalda HERO, MD  Relative Name and Phone Number:       Current Level of Care: Hospital Recommended Level of Care: Skilled Nursing Facility Prior Approval Number:    Date Approved/Denied:   PASRR Number: [id number]  Discharge Plan: SNF    Current Diagnoses: Patient Active Problem List   Diagnosis Date Noted   Fecal impaction (HCC) 07/15/2024   Stercoral ulcer of rectum 07/14/2024   Abnormal CT scan, colon 07/14/2024   Long term (current) use of antithrombotics/antiplatelets 07/14/2024   Hematochezia 07/13/2024   Stercoral colitis 07/13/2024   History of rectal ulcer 07/13/2024   Rectal bleed 07/12/2024   Gangrene of foot (HCC) 06/23/2024   Gangrene of right foot (HCC) 04/26/2024   PAD (peripheral artery disease) 04/25/2024   Gangrene of left foot (HCC) 03/25/2024   Acute osteomyelitis of left ankle or foot (HCC) 03/25/2024   Wound dehiscence, surgical 03/20/2024   Postoperative wound infection 03/19/2024   ESRD on dialysis (HCC) 03/19/2024   PVD (peripheral vascular disease) 03/19/2024   Thrombocytopenia 03/19/2024   Controlled type 2 diabetes mellitus without complication, without long-term current use of insulin  (HCC) 03/19/2024   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   Liver lesion 09/05/2023   Chronic idiopathic constipation 09/05/2023   Hemorrhoids 09/05/2023   Dyspepsia 06/01/2023   Dysphagia 06/01/2023   Hyperkalemia 05/19/2023   Type 2 diabetes mellitus (HCC) 03/29/2023   Infection of AV graft for dialysis 03/27/2023   MRSA bacteremia  03/21/2023   Enterococcal bacteremia 03/21/2023   Hemodialysis catheter infection 03/21/2023   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   Delirium 03/21/2023   Protein-calorie malnutrition, severe 03/16/2023   Acute metabolic encephalopathy 03/06/2023   Rectal bleeding 01/14/2023   Acute blood loss anemia 01/14/2023   Hypokalemia 01/12/2023   Proctitis 01/12/2023   Hypothyroidism 01/12/2023   GERD (gastroesophageal reflux disease) 01/12/2023   ESRD (end stage renal disease) (HCC) 01/12/2023   GI bleed 01/11/2023   Anemia in chronic kidney disease 10/04/2022   End stage renal disease (HCC) 10/04/2022   Anemia of chronic renal failure 06/09/2022   Pulmonary hypertension, unspecified (HCC) 07/20/2021   Stage 3 chronic kidney disease (HCC) 11/27/2020   Acute on chronic combined systolic and diastolic CHF (congestive heart failure) (HCC) 09/09/2020   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   Anemia of chronic disease 09/09/2020   Acute respiratory failure with hypoxia (HCC) 09/09/2020   Anasarca 08/28/2020   Nephrotic syndrome 08/28/2020   Dilated cardiomyopathy (HCC) 08/17/2020   Neuropathy 07/12/2018   Pyogenic inflammation of bone (HCC) 07/12/2018   Essential hypertension 02/14/2018   Uncontrolled type 2 diabetes mellitus with hyperglycemia (HCC) 01/08/2018   Hyperlipidemia associated with type 2 diabetes mellitus (HCC) 01/08/2018    Orientation RESPIRATION BLADDER Height & Weight     Self, Place  Normal Continent Weight: (S) 117 lb 4.6 oz (53.2 kg) (Bed Scale) Height:  5' 10 (177.8 cm)  BEHAVIORAL SYMPTOMS/MOOD NEUROLOGICAL BOWEL NUTRITION STATUS      Incontinent Diet (See dc summary)  AMBULATORY STATUS COMMUNICATION OF NEEDS Skin   Extensive Assist Verbally  Surgical wounds (Closed incision on foot and leg)                       Personal Care Assistance Level of Assistance  Bathing, Feeding, Dressing Bathing Assistance: Maximum assistance Feeding  assistance: Limited assistance Dressing Assistance: Maximum assistance     Functional Limitations Info  Sight Sight Info: Impaired        SPECIAL CARE FACTORS FREQUENCY  PT (By licensed PT), OT (By licensed OT)     PT Frequency: 5x/week OT Frequency: 5x/week            Contractures Contractures Info: Not present    Additional Factors Info  Code Status, Allergies, Isolation Precautions Code Status Info: DNR-limited Allergies Info: NKA     Isolation Precautions Info: MRSA     Current Medications (07/15/2024):  This is the current hospital active medication list Current Facility-Administered Medications  Medication Dose Route Frequency Provider Last Rate Last Admin   alteplase  (CATHFLO ACTIVASE ) injection 2 mg  2 mg Intracatheter Once PRN Dolan Mateo Larger, MD       anticoagulant sodium citrate  solution 5 mL  5 mL Intracatheter PRN Dolan Mateo Larger, MD       bisacodyl  (DULCOLAX) suppository 10 mg  10 mg Rectal Daily PRN Raenelle Donalda HERO, MD       cefTRIAXone  (ROCEPHIN ) 2 g in sodium chloride  0.9 % 100 mL IVPB  2 g Intravenous Q24H Raenelle Donalda HERO, MD 200 mL/hr at 07/15/24 1037 2 g at 07/15/24 1037   Chlorhexidine  Gluconate Cloth 2 % PADS 6 each  6 each Topical Q0600 Jake Maisie Fellows, PA-C   6 each at 07/15/24 0542   Chlorhexidine  Gluconate Cloth 2 % PADS 6 each  6 each Topical Q0600 Dolan Mateo Larger, MD   6 each at 07/15/24 0542   gabapentin  (NEURONTIN ) capsule 100 mg  100 mg Oral QHS Raenelle Donalda HERO, MD   100 mg at 07/14/24 2106   haloperidol  lactate (HALDOL ) injection 2 mg  2 mg Intravenous Q6H PRN Raenelle Donalda HERO, MD       heparin  injection 1,000 Units  1,000 Units Intracatheter PRN Dolan Mateo Larger, MD       insulin  aspart (novoLOG ) injection 0-6 Units  0-6 Units Subcutaneous TID WC Raenelle Donalda HERO, MD   2 Units at 07/15/24 1248   levothyroxine  (SYNTHROID ) tablet 75 mcg  75 mcg Oral Q0600 Raenelle Donalda HERO, MD   75 mcg at 07/15/24  9462   lidocaine  (PF) (XYLOCAINE ) 1 % injection 5 mL  5 mL Intradermal PRN Jake Maisie Fellows, PA-C       lidocaine  (PF) (XYLOCAINE ) 1 % injection 5 mL  5 mL Intradermal PRN Dolan Mateo Larger, MD       lidocaine -prilocaine  (EMLA ) cream 1 Application  1 Application Topical PRN Ejigiri, Maisie Fellows, PA-C       lidocaine -prilocaine  (EMLA ) cream 1 Application  1 Application Topical PRN Bhandari, Dron Prasad, MD       metroNIDAZOLE  (FLAGYL ) IVPB 500 mg  500 mg Intravenous Q12H Raenelle Donalda HERO, MD 100 mL/hr at 07/15/24 1033 500 mg at 07/15/24 1033   midodrine  (PROAMATINE ) tablet 10 mg  10 mg Oral TID WC Ghimire, Donalda HERO, MD   10 mg at 07/15/24 1248   ondansetron  (ZOFRAN ) tablet 4 mg  4 mg Oral Q6H PRN Sim Emery CROME, MD       Or   ondansetron  (ZOFRAN ) injection 4 mg  4 mg Intravenous  Q6H PRN Sim Emery CROME, MD       oxyCODONE  (Oxy IR/ROXICODONE ) immediate release tablet 5 mg  5 mg Oral Q6H PRN Raenelle Donalda HERO, MD   5 mg at 07/14/24 1559   pantoprazole  (PROTONIX ) EC tablet 40 mg  40 mg Oral BID Mansouraty, Gabriel Jr., MD   40 mg at 07/15/24 1038   pentafluoroprop-tetrafluoroeth (GEBAUERS) aerosol 1 Application  1 Application Topical PRN Bhandari, Dron Prasad, MD       polyethylene glycol (MIRALAX  / GLYCOLAX ) packet 17 g  17 g Oral TID Raenelle Donalda HERO, MD   17 g at 07/15/24 1135   senna-docusate (Senokot-S) tablet 2 tablet  2 tablet Oral BID Raenelle Donalda HERO, MD   2 tablet at 07/15/24 1038   sorbitol , magnesium hydroxide, mineral oil, glycerin (SMOG) enema  960 mL Rectal Once Mansouraty, Gabriel Jr., MD       thiamine  (VITAMIN B1) tablet 100 mg  100 mg Oral Daily Ghimire, Shanker M, MD   100 mg at 07/15/24 1038     Discharge Medications: Please see discharge summary for a list of discharge medications.  Relevant Imaging Results:  Relevant Lab Results:   Additional Information SSN [id number]  HD @ [facility] MWF 1210 chair time  Inocente GORMAN Kindle, LCSW     "

## 2024-07-15 NOTE — Plan of Care (Signed)

## 2024-07-16 ENCOUNTER — Telehealth: Payer: Self-pay | Admitting: Podiatry

## 2024-07-16 DIAGNOSIS — Z992 Dependence on renal dialysis: Secondary | ICD-10-CM | POA: Diagnosis not present

## 2024-07-16 DIAGNOSIS — K5900 Constipation, unspecified: Secondary | ICD-10-CM | POA: Diagnosis not present

## 2024-07-16 DIAGNOSIS — N186 End stage renal disease: Secondary | ICD-10-CM | POA: Diagnosis not present

## 2024-07-16 DIAGNOSIS — K625 Hemorrhage of anus and rectum: Secondary | ICD-10-CM | POA: Diagnosis not present

## 2024-07-16 DIAGNOSIS — D638 Anemia in other chronic diseases classified elsewhere: Secondary | ICD-10-CM | POA: Diagnosis not present

## 2024-07-16 DIAGNOSIS — I739 Peripheral vascular disease, unspecified: Secondary | ICD-10-CM | POA: Diagnosis not present

## 2024-07-16 DIAGNOSIS — Z8719 Personal history of other diseases of the digestive system: Secondary | ICD-10-CM

## 2024-07-16 LAB — CBC
HCT: 34.5 % — ABNORMAL LOW (ref 39.0–52.0)
HCT: 37.3 % — ABNORMAL LOW (ref 39.0–52.0)
Hemoglobin: 11.1 g/dL — ABNORMAL LOW (ref 13.0–17.0)
Hemoglobin: 11.7 g/dL — ABNORMAL LOW (ref 13.0–17.0)
MCH: 28.1 pg (ref 26.0–34.0)
MCH: 28.1 pg (ref 26.0–34.0)
MCHC: 32.2 g/dL (ref 30.0–36.0)
MCHC: 31.4 g/dL (ref 30.0–36.0)
MCV: 87.3 fL (ref 80.0–100.0)
MCV: 89.4 fL (ref 80.0–100.0)
Platelets: 235 10*3/uL (ref 150–400)
Platelets: 225 10*3/uL (ref 150–400)
RBC: 3.95 MIL/uL — ABNORMAL LOW (ref 4.22–5.81)
RBC: 4.17 MIL/uL — ABNORMAL LOW (ref 4.22–5.81)
RDW: 17.1 % — ABNORMAL HIGH (ref 11.5–15.5)
RDW: 17 % — ABNORMAL HIGH (ref 11.5–15.5)
WBC: 14.7 10*3/uL — ABNORMAL HIGH (ref 4.0–10.5)
WBC: 13 10*3/uL — ABNORMAL HIGH (ref 4.0–10.5)
nRBC: 0 % (ref 0.0–0.2)
nRBC: 0 % (ref 0.0–0.2)

## 2024-07-16 LAB — RENAL FUNCTION PANEL
Albumin: 3.1 g/dL — ABNORMAL LOW (ref 3.5–5.0)
Anion gap: 17 — ABNORMAL HIGH (ref 5–15)
BUN: 49 mg/dL — ABNORMAL HIGH (ref 8–23)
CO2: 22 mmol/L (ref 22–32)
Calcium: 8.7 mg/dL — ABNORMAL LOW (ref 8.9–10.3)
Chloride: 98 mmol/L (ref 98–111)
Creatinine, Ser: 7.04 mg/dL — ABNORMAL HIGH (ref 0.61–1.24)
GFR, Estimated: 8 mL/min — ABNORMAL LOW
Glucose, Bld: 106 mg/dL — ABNORMAL HIGH (ref 70–99)
Phosphorus: 5.2 mg/dL — ABNORMAL HIGH (ref 2.5–4.6)
Potassium: 3.8 mmol/L (ref 3.5–5.1)
Sodium: 137 mmol/L (ref 135–145)

## 2024-07-16 LAB — GLUCOSE, CAPILLARY
Glucose-Capillary: 96 mg/dL (ref 70–99)
Glucose-Capillary: 86 mg/dL (ref 70–99)
Glucose-Capillary: 80 mg/dL (ref 70–99)
Glucose-Capillary: 150 mg/dL — ABNORMAL HIGH (ref 70–99)

## 2024-07-16 MED ORDER — BISACODYL 5 MG PO TBEC
10.0000 mg | DELAYED_RELEASE_TABLET | Freq: Once | ORAL | Status: AC
  Administered 2024-07-16: 10 mg via ORAL
  Filled 2024-07-16: qty 2

## 2024-07-16 MED ORDER — POLYETHYLENE GLYCOL 3350 17 GM/SCOOP PO POWD
119.0000 g | Freq: Once | ORAL | Status: AC
  Administered 2024-07-16: 119 g via ORAL
  Filled 2024-07-16: qty 119

## 2024-07-16 MED ORDER — POLYETHYLENE GLYCOL 3350 17 GM/SCOOP PO POWD
119.0000 g | Freq: Once | ORAL | Status: DC
  Filled 2024-07-16: qty 119

## 2024-07-16 NOTE — Progress Notes (Signed)
" ° ° ° °   Progress Note   Subjective  Patient drank and tolerated most of Miralax  prep overnight with multiple BMs but still passing Stalvey stool.  Had rectal bleeding with Glassner overnight, not much bleeding today but stool still Thorson.  Was not ready for flex sig/colonoscopy   Objective   Vital signs in last 24 hours: Temp:  [97.4 F (36.3 C)-98.1 F (36.7 C)] 97.6 F (36.4 C) (02/03 1200) Pulse Rate:  [67-90] 67 (02/03 0401) Resp:  [9-21] 9 (02/03 0401) BP: (124-158)/(73-85) 124/85 (02/03 0401) SpO2:  [100 %] 100 % (02/03 0401) Weight:  [52.2 kg] 52.2 kg (02/02 1504) Last BM Date : 07/16/24 General:    white male in NAD Neurologic:  Alert and oriented,  grossly normal neurologically. Psych:  Cooperative. Normal mood and affect.  Intake/Output from previous day: 02/02 0701 - 02/03 0700 In: 1050 [P.O.:750; IV Piggyback:300] Out: -200  Intake/Output this shift: No intake/output data recorded.  Lab Results: Recent Labs    07/15/24 1222 07/15/24 1638 07/16/24 0443  WBC 16.5* 15.4* 14.7*  HGB 13.3 12.1* 11.1*  HCT 41.0 37.7* 34.5*  PLT 272 263 235   BMET Recent Labs    07/15/24 0237 07/15/24 1222 07/16/24 0443  NA 135 135 137  K 4.0 4.3 3.8  CL 96* 94* 98  CO2 22 24 22   GLUCOSE 106* 221* 106*  BUN 45* 47* 49*  CREATININE 6.33* 6.75* 7.04*  CALCIUM  8.9 9.0 8.7*   LFT Recent Labs    07/16/24 0443  ALBUMIN  3.1*   PT/INR No results for input(s): LABPROT, INR in the last 72 hours.  Studies/Results: DG Abd 2 Views Result Date: 07/15/2024 CLINICAL DATA:  Lower abdominal pain, toxic dilatation of the colon. EXAM: ABDOMEN - 2 VIEW COMPARISON:  CT abdomen pelvis 07/12/2024. FINDINGS: Scattered gas-filled nondistended small bowel and colon. No free air. Visualized lung bases show a catheter tip in the low right atrium. Lung bases are clear. IMPRESSION: No acute findings. Electronically Signed   By: Newell Eke M.D.   On: 07/15/2024 08:30       Assessment /  Plan:    64 y/o male here with the following:  Rectal bleeding History of stercoral ulceration / constipation ESRD on HD PAD on antiplatelet therapy s/p R BKA-L TMA  Prior colonoscopy in 2024 showed stercoral ulceration as cause for rectal bleeding in the setting of constipation although prep was poor and incomplete colonoscopy not completed.  In need of antiplatelet therapy, have offered him colonoscopy to ensure no other cause for his bleeding and see if this stercoral ulcer remains present on.  He did what sounds like most of a bowel prep overnight, did have some bleeding with this, which is tapered off today.  Unfortunately his stools are still Silliman and does not appear ready for colonoscopy.  This is my first time seeing him, he seems willing to drink another prep tonight but will see if he will actually do that, he is not happy that he needs to do this.  If he can drink as much as he can we will try to do as much of the colonoscopy as visualization allows tomorrow.  If he is unwilling to drink or prep, consider enema and flex sig tomorrow.  Will see how he does overnight with prep.  Call with questions, n.p.o. after midnight  Marcey Naval, MD [facility]  "

## 2024-07-16 NOTE — H&P (View-Only) (Signed)
" ° ° ° °   Progress Note   Subjective  Patient drank and tolerated most of Miralax  prep overnight with multiple BMs but still passing Stalvey stool.  Had rectal bleeding with Glassner overnight, not much bleeding today but stool still Thorson.  Was not ready for flex sig/colonoscopy   Objective   Vital signs in last 24 hours: Temp:  [97.4 F (36.3 C)-98.1 F (36.7 C)] 97.6 F (36.4 C) (02/03 1200) Pulse Rate:  [67-90] 67 (02/03 0401) Resp:  [9-21] 9 (02/03 0401) BP: (124-158)/(73-85) 124/85 (02/03 0401) SpO2:  [100 %] 100 % (02/03 0401) Weight:  [52.2 kg] 52.2 kg (02/02 1504) Last BM Date : 07/16/24 General:    white male in NAD Neurologic:  Alert and oriented,  grossly normal neurologically. Psych:  Cooperative. Normal mood and affect.  Intake/Output from previous day: 02/02 0701 - 02/03 0700 In: 1050 [P.O.:750; IV Piggyback:300] Out: -200  Intake/Output this shift: No intake/output data recorded.  Lab Results: Recent Labs    07/15/24 1222 07/15/24 1638 07/16/24 0443  WBC 16.5* 15.4* 14.7*  HGB 13.3 12.1* 11.1*  HCT 41.0 37.7* 34.5*  PLT 272 263 235   BMET Recent Labs    07/15/24 0237 07/15/24 1222 07/16/24 0443  NA 135 135 137  K 4.0 4.3 3.8  CL 96* 94* 98  CO2 22 24 22   GLUCOSE 106* 221* 106*  BUN 45* 47* 49*  CREATININE 6.33* 6.75* 7.04*  CALCIUM  8.9 9.0 8.7*   LFT Recent Labs    07/16/24 0443  ALBUMIN  3.1*   PT/INR No results for input(s): LABPROT, INR in the last 72 hours.  Studies/Results: DG Abd 2 Views Result Date: 07/15/2024 CLINICAL DATA:  Lower abdominal pain, toxic dilatation of the colon. EXAM: ABDOMEN - 2 VIEW COMPARISON:  CT abdomen pelvis 07/12/2024. FINDINGS: Scattered gas-filled nondistended small bowel and colon. No free air. Visualized lung bases show a catheter tip in the low right atrium. Lung bases are clear. IMPRESSION: No acute findings. Electronically Signed   By: Newell Eke M.D.   On: 07/15/2024 08:30       Assessment /  Plan:    64 y/o male here with the following:  Rectal bleeding History of stercoral ulceration / constipation ESRD on HD PAD on antiplatelet therapy s/p R BKA-L TMA  Prior colonoscopy in 2024 showed stercoral ulceration as cause for rectal bleeding in the setting of constipation although prep was poor and incomplete colonoscopy not completed.  In need of antiplatelet therapy, have offered him colonoscopy to ensure no other cause for his bleeding and see if this stercoral ulcer remains present on.  He did what sounds like most of a bowel prep overnight, did have some bleeding with this, which is tapered off today.  Unfortunately his stools are still Silliman and does not appear ready for colonoscopy.  This is my first time seeing him, he seems willing to drink another prep tonight but will see if he will actually do that, he is not happy that he needs to do this.  If he can drink as much as he can we will try to do as much of the colonoscopy as visualization allows tomorrow.  If he is unwilling to drink or prep, consider enema and flex sig tomorrow.  Will see how he does overnight with prep.  Call with questions, n.p.o. after midnight  Marcey Naval, MD [facility]  "

## 2024-07-16 NOTE — Telephone Encounter (Signed)
 Contacted the patient's mother who disclosed that [name] is in the hospital and they will not be able to set up a follow-up appt at this time. She said they would be in contact when he would be able to come see Dr. Malvin for a follow-up as soon as they can but if the doctor could come and see him in [facility] for the follow up it would be better for him as the patient. Advised: I would let Dr. Malvin know; however He has a very busy schedule am not sure as to whether or not he will be able to accommodate [name] as requested but I would pass along the message. Otherwise, please contact [facility] for an appt at their earliest convenience.

## 2024-07-17 ENCOUNTER — Inpatient Hospital Stay (HOSPITAL_COMMUNITY): Admitting: Anesthesiology

## 2024-07-17 ENCOUNTER — Encounter (HOSPITAL_COMMUNITY): Admission: EM | Payer: Self-pay | Source: Home / Self Care | Attending: Internal Medicine

## 2024-07-17 ENCOUNTER — Encounter (HOSPITAL_COMMUNITY): Payer: Self-pay | Admitting: Internal Medicine

## 2024-07-17 ENCOUNTER — Encounter (HOSPITAL_COMMUNITY): Payer: Self-pay | Admitting: Anesthesiology

## 2024-07-17 DIAGNOSIS — K626 Ulcer of anus and rectum: Secondary | ICD-10-CM | POA: Diagnosis not present

## 2024-07-17 DIAGNOSIS — K5641 Fecal impaction: Secondary | ICD-10-CM | POA: Diagnosis not present

## 2024-07-17 DIAGNOSIS — K625 Hemorrhage of anus and rectum: Secondary | ICD-10-CM | POA: Diagnosis not present

## 2024-07-17 LAB — CBC
HCT: 38.4 % — ABNORMAL LOW (ref 39.0–52.0)
HCT: 38.2 % — ABNORMAL LOW (ref 39.0–52.0)
Hemoglobin: 12.1 g/dL — ABNORMAL LOW (ref 13.0–17.0)
Hemoglobin: 12 g/dL — ABNORMAL LOW (ref 13.0–17.0)
MCH: 28 pg (ref 26.0–34.0)
MCH: 28.2 pg (ref 26.0–34.0)
MCHC: 31.5 g/dL (ref 30.0–36.0)
MCHC: 31.4 g/dL (ref 30.0–36.0)
MCV: 88.9 fL (ref 80.0–100.0)
MCV: 89.7 fL (ref 80.0–100.0)
Platelets: 256 10*3/uL (ref 150–400)
Platelets: 216 10*3/uL (ref 150–400)
RBC: 4.32 MIL/uL (ref 4.22–5.81)
RBC: 4.26 MIL/uL (ref 4.22–5.81)
RDW: 16.9 % — ABNORMAL HIGH (ref 11.5–15.5)
RDW: 17 % — ABNORMAL HIGH (ref 11.5–15.5)
WBC: 13.5 10*3/uL — ABNORMAL HIGH (ref 4.0–10.5)
WBC: 15.8 10*3/uL — ABNORMAL HIGH (ref 4.0–10.5)
nRBC: 0 % (ref 0.0–0.2)
nRBC: 0 % (ref 0.0–0.2)

## 2024-07-17 LAB — GLUCOSE, CAPILLARY
Glucose-Capillary: 99 mg/dL (ref 70–99)
Glucose-Capillary: 98 mg/dL (ref 70–99)
Glucose-Capillary: 97 mg/dL (ref 70–99)
Glucose-Capillary: 101 mg/dL — ABNORMAL HIGH (ref 70–99)
Glucose-Capillary: 152 mg/dL — ABNORMAL HIGH (ref 70–99)

## 2024-07-17 LAB — RENAL FUNCTION PANEL
Albumin: 3.2 g/dL — ABNORMAL LOW (ref 3.5–5.0)
Anion gap: 20 — ABNORMAL HIGH (ref 5–15)
BUN: 54 mg/dL — ABNORMAL HIGH (ref 8–23)
CO2: 22 mmol/L (ref 22–32)
Calcium: 8.9 mg/dL (ref 8.9–10.3)
Chloride: 99 mmol/L (ref 98–111)
Creatinine, Ser: 8.27 mg/dL — ABNORMAL HIGH (ref 0.61–1.24)
GFR, Estimated: 7 mL/min — ABNORMAL LOW
Glucose, Bld: 106 mg/dL — ABNORMAL HIGH (ref 70–99)
Phosphorus: 6.8 mg/dL — ABNORMAL HIGH (ref 2.5–4.6)
Potassium: 4.3 mmol/L (ref 3.5–5.1)
Sodium: 140 mmol/L (ref 135–145)

## 2024-07-17 LAB — HEPATITIS B SURFACE ANTIBODY, QUANTITATIVE: Hep B S AB Quant (Post): 2455 m[IU]/mL

## 2024-07-17 MED ORDER — LIDOCAINE 2% (20 MG/ML) 5 ML SYRINGE
INTRAMUSCULAR | Status: DC | PRN
  Administered 2024-07-17: 80 mg via INTRAVENOUS

## 2024-07-17 MED ORDER — ONDANSETRON HCL 4 MG/2ML IJ SOLN
INTRAMUSCULAR | Status: DC | PRN
  Administered 2024-07-17: 4 mg via INTRAVENOUS

## 2024-07-17 MED ORDER — SODIUM CHLORIDE 0.9 % IV SOLN: INTRAVENOUS | Status: DC | PRN

## 2024-07-17 MED ORDER — PROPOFOL 10 MG/ML IV BOLUS
INTRAVENOUS | Status: DC | PRN
  Administered 2024-07-17: 10 mg via INTRAVENOUS

## 2024-07-17 MED ORDER — PROPOFOL 500 MG/50ML IV EMUL
INTRAVENOUS | Status: DC | PRN
  Administered 2024-07-17: 150 ug/kg/min via INTRAVENOUS

## 2024-07-17 NOTE — Interval H&P Note (Signed)
 History and Physical Interval Note: patient drank some prep but not all of it. He elected to do flex sig today to reassess his rectum, difficulty with prep, enema given pre procedure. Rule out stercoral ulceration. Further recommendations pending the result  07/17/2024 11:53 AM  [name] [name]  has presented today for surgery, with the diagnosis of rectal bleeding.  The various methods of treatment have been discussed with the patient and family. After consideration of risks, benefits and other options for treatment, the patient has consented to  Procedures: SIGMOIDOSCOPY, FLEXIBLE (N/A) as a surgical intervention.  The patient's history has been reviewed, patient examined, no change in status, stable for surgery.  I have reviewed the patient's chart and labs.  Questions were answered to the patient's satisfaction.     Elspeth P Jalayia Bagheri

## 2024-07-17 NOTE — Progress Notes (Signed)
 [facility] Progress Note Patient: [name] [name] FMW:[id number] DOB: Jul 10, 1960  DOA: 07/12/2024 DOS: the patient was seen and examined on 07/17/2024  Brief Summary: Patient is a 64 y.o.  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   Significant events: 1/30>> admit to [facility].   Significant studies: 1/30>> CT angio GI bleed: No active bleed-fecal impaction with stercoral proctitis.    Procedures: Flexible sigmoidoscopy.  Rectal ulcer likely from impaction.   Consults: GI Nephrology Podiatry  Assessment and plan: Lower GI bleed with ABLA Either diverticular bleed or secondary to stercoral colitis Continues to have multiple episodes of hematochezia S/p 2 units of PRBC on 1/31 Hemoglobin remained stable. Continue to follow CBC Initial plan was for colonoscopy although patient refused to drink the prep.  Underwent enema followed by flexible sigmoidoscopy which showed bowel and rectal impaction with ulcer.  Continue stool softeners.   Stercoral colitis Significant stool burden on CT with fecal impaction Has had numerous BMs with laxatives/enemas Treated with Rocephin /Flagyl  x 5 days.   ESRD on HD MWF Nephrology following and directing HD care.   Multifactorial anemia Secondary to blood loss-ESRD See above   PAD-s/p right BKA-left TMA Antiplatelets on hold due to GI bleeding   Chronic combined systolic and diastolic heart failure Euvolemic Volume removal with HD   HTN BP stable with midodrine . All antihypertensives remain on hold.   HLD Statin   GERD PPI   Hypothyroidism Synthroid    DM-2 (A1c 4.6 in 11/13) CBG stable Currently on sliding scale insulin .   Cognitive issues/poor compliance Intermittently uncooperative-has been refusing blood work-refusing multiple  meds-initially refused transfusion but then consent obtained after talking to the patient's mother who gave consent and then subsequently spoke with the patient who then was agreeable.  Underweight. Body mass index is 16.51 kg/m.  Placing the patient at a high risk for poor outcome.    Code Status: Limited: Do not attempt resuscitation (DNR) -DNR-LIMITED -Do Not Intubate/DNI     DVT Prophylaxis: SCDs Start: 07/12/24 2207   Data review I have Reviewed nursing notes, Vitals, and Lab results. Since last encounter, pertinent lab results CBC and BMP   . I have ordered test including CBC and BMP  . I have discussed pt's care plan and test results with GI  .   Physical exam. Vitals:   07/17/24 1230 07/17/24 1240 07/17/24 1300 07/17/24 1600  BP: 131/86 116/78 134/86 (!) 169/93  Pulse: 73 74 78 81  Resp: 11 13 15 17   Temp:   98.6 F (37 C) 98.7 F (37.1 C)  TempSrc:   Oral Oral  SpO2: 100% 98%  100%  Weight:      Height:      Clear to auscultation. S1-S2 present Bowel sound present. Nontender. No edema.  Subjective: Denies any acute complaint. No nausea or vomiting.  No fever no chills.  Still had a rectal tube with blood in it.  Family Communication: No one at bedside.  Disposition Plan: Status is: Inpatient Remains inpatient appropriate because: Monitor for stability of H&H.   Planned Discharge Destination: SNF Diet: Diet Order             Diet renal with fluid restriction Fluid restriction: 1200 mL Fluid; Room service appropriate? Yes; Fluid consistency: Thin  Diet effective now  MEDICATIONS: Scheduled Meds:  Chlorhexidine  Gluconate Cloth  6 each Topical Q0600   Chlorhexidine  Gluconate Cloth  6 each Topical Q0600   gabapentin   100 mg Oral QHS   insulin  aspart  0-6 Units Subcutaneous TID WC   levothyroxine   75 mcg Oral Q0600   midodrine   10 mg Oral TID WC   pantoprazole   40 mg Oral BID   polyethylene glycol  17 g Oral TID   polyethylene  glycol powder  119 g Oral Once   senna-docusate  2 tablet Oral BID   SMOG  960 mL Rectal Once   thiamine   100 mg Oral Daily   Continuous Infusions:  cefTRIAXone  (ROCEPHIN )  IV 2 g (07/17/24 1023)   metronidazole  500 mg (07/17/24 1022)   PRN Meds:.bisacodyl , haloperidol  lactate, ondansetron  **OR** ondansetron  (ZOFRAN ) IV, oxyCODONE   Author: Yetta Blanch, MD  [facility] 07/17/2024  5:07 PM Between 7PM-7AM, please contact night-coverage, check [email or web address] for on call.

## 2024-07-17 NOTE — TOC Progression Note (Signed)
 Transition of Care [facility]) - Progression Note    Patient Details  Name: [name] [name] MRN: [id number] Date of Birth: [birth year]  [facility] CM/SW Contact  Inocente GORMAN Kindle, LCSW Phone Number: 07/17/2024, 9:07 AM  Clinical Narrative:    CSW continuing to follow for medical progression. Will require insurance approval prior to return to SNF.    Expected Discharge Plan: Skilled Nursing Facility Barriers to Discharge: English As A Second Language Teacher, Continued Medical Work up               Expected Discharge Plan and Services In-house Referral: Clinical Social Work   Post Acute Care Choice: Skilled Nursing Facility Living arrangements for the past 2 months: Single Family Home                                       Social Drivers of Health (SDOH) Interventions SDOH Screenings   Food Insecurity: No Food Insecurity (07/14/2024)  Housing: Low Risk (07/14/2024)  Transportation Needs: No Transportation Needs (07/14/2024)  Utilities: Not At Risk (07/14/2024)  Social Connections: Socially Isolated (07/14/2024)  Tobacco Use: Low Risk (07/12/2024)    Readmission Risk Interventions    07/17/2024    9:05 AM 03/28/2024    1:14 PM 03/29/2023    1:45 PM  Readmission Risk Prevention Plan  Transportation Screening Complete Complete Complete  Medication Review Oceanographer) Complete Complete Complete  PCP or Specialist appointment within 3-5 days of discharge Complete  Complete  HRI or Home Care Consult Complete Complete Complete  SW Recovery Care/Counseling Consult Complete Patient refused Complete  Palliative Care Screening Complete Complete Complete  Skilled Nursing Facility Complete Patient Refused Complete

## 2024-07-17 NOTE — Anesthesia Postprocedure Evaluation (Signed)
"   Anesthesia Post Note  Patient: [name] [name]  Procedure(s) Performed: KINGSTON SIDE     Patient location during evaluation: PACU Anesthesia Type: MAC Level of consciousness: awake and alert Pain management: pain level controlled Vital Signs Assessment: post-procedure vital signs reviewed and stable Respiratory status: spontaneous breathing, nonlabored ventilation, respiratory function stable and patient connected to nasal cannula oxygen Cardiovascular status: stable and blood pressure returned to baseline Postop Assessment: no apparent nausea or vomiting Anesthetic complications: no   No notable events documented.  Last Vitals:  Vitals:   07/17/24 1117 07/17/24 1214  BP: (!) 150/79 125/80  Pulse: 75 74  Resp: 12 (!) 22  Temp: 36.6 C (!) 36.1 C  SpO2: 99% 100%    Last Pain:  Vitals:   07/17/24 1214  TempSrc: Temporal  PainSc: 0-No pain                 [name]      "

## 2024-07-17 NOTE — Transfer of Care (Signed)
 Immediate Anesthesia Transfer of Care Note  Patient: [name] [name]  Procedure(s) Performed: KINGSTON SIDE  Patient Location: Endoscopy Unit  Anesthesia Type:MAC  Level of Consciousness: awake, alert , and oriented  Airway & Oxygen Therapy: Patient Spontanous Breathing  Post-op Assessment: Report given to RN and Post -op Vital signs reviewed and stable  Post vital signs: Reviewed and stable  Last Vitals:  Vitals Value Taken Time  BP    Temp    Pulse    Resp    SpO2      Last Pain:  Vitals:   07/17/24 1117  TempSrc: Temporal  PainSc: 0-No pain      Patients Stated Pain Goal: 0 (07/14/24 2157)  Complications: No notable events documented.

## 2024-07-17 NOTE — Plan of Care (Signed)

## 2024-07-17 NOTE — Op Note (Signed)
 [facility] Patient Name: [name] [name] Procedure Date : 07/17/2024 MRN: [id number] Attending MD: Elspeth SQUIBB. Leigh , MD, [id number] Date of Birth: 1961/01/12 CSN: [id number] Age: 64 Admit Type: Inpatient Procedure:                Flexible Sigmoidoscopy Indications:              Rectal bleeding - history of constipation and                            stercoral ulceration in the past. Inability to                            prep, flex sig to further evaluate. Providers:                Elspeth SQUIBB. Leigh, MD, Robie Breed,                            RN, Coye Bade, Technician, Fairy Marina,                            Technician Referring MD:              Medicines:                Monitored Anesthesia Care Complications:            No immediate complications. Estimated blood loss:                            Minimal. Estimated Blood Loss:     Estimated blood loss was minimal. Procedure:                Pre-Anesthesia Assessment:                           - Prior to the procedure, a History and Physical                            was performed, and patient medications and                            allergies were reviewed. The patient's tolerance of                            previous anesthesia was also reviewed. The risks                            and benefits of the procedure and the sedation                            options and risks were discussed with the patient.                            All questions were answered, and informed consent                            was  obtained. Prior Anticoagulants: The patient has                            taken Plavix  (clopidogrel ), last dose was 6 days                            prior to procedure. ASA Grade Assessment: IV - A                            patient with severe systemic disease that is a                            constant threat to life. After reviewing the risks                             and benefits, the patient was deemed in                            satisfactory condition to undergo the procedure.                           After obtaining informed consent, the scope was                            passed under direct vision. The PCF-HQ190L                            ([id number]) Olympus colonoscope was introduced                            through the anus and advanced to the the sigmoid                            colon. The flexible sigmoidoscopy was accomplished                            without difficulty. The patient tolerated the                            procedure well. The quality of the bowel                            preparation was fair. Scope In: Scope Out: Findings:      The digital rectal exam findings included a significant fecal impaction.       Large fecal impaction - manual disimpaction done with anesthesia.      A single (solitary) six mm ulcer was found in the distal rectum. No       bleeding was present. Stigmata of recent bleeding were present - red       spot. This area was just proximal to the dentate line. It was not an       easy location to clip along this area / dentate line - given no active       bleeding and relief of impaction,  endoscopic treatment not performed.       Should he rebleed we can treat it but has not had further bleeding.      The rectum and distal sigmoid colon appeared normal of what was       visualized. Impression:               - Preparation of the colon was fair.                           - Fecal impaction. found on digital rectal exam,                            manual disimpaction performed.                           - A single (solitary) ulcer in the distal rectum                            with red spot - as outlined above, source of                            bleeding.                           - The rectum and distal sigmoid colon are normal of                            what was visualized. Recommendation:            - Return patient to hospital ward for ongoing care.                           - Advance diet as tolerated.                           - Continue present medications.                           - Miralax  two to three times daily - avoid                            constipation, recurrent impaction (he has had this                            twice now)                           - Outpatient colonoscopy if the patient is willing                            (I do not think he would be willing to do prep as                            outpatient, he was not amenable as inpatient). He  can follow up with his primary GI MD in [place].                           - Call if recurrent bleeding                           - Resume Plavix  in 3 days or so if no recurrent                            bleeding.                           - GI will sign off for now, call with questions in                            the interim. Procedure Code(s):        --- Professional ---                           [phone], Sigmoidoscopy, flexible; diagnostic,                            including collection of specimen(s) by brushing or                            washing, when performed (separate procedure) Diagnosis Code(s):        --- Professional ---                           K62.6, Ulcer of anus and rectum                           K62.5, Hemorrhage of anus and rectum CPT copyright 2022 American Medical Association. All rights reserved. The codes documented in this report are preliminary and upon coder review may  be revised to meet current compliance requirements. Elspeth P. Emi Lymon, MD 07/17/2024 12:15:37 PM This report has been signed electronically. Number of Addenda: 0

## 2024-07-17 NOTE — Progress Notes (Signed)
 " [facility] Progress Note   Subjective: Seen in room. Says he's not doing ok. Denies abd pain, n/v. For colonoscopy today.   Objective Vitals:   07/17/24 1220 07/17/24 1230 07/17/24 1240 07/17/24 1300  BP: 121/77 131/86 116/78 134/86  Pulse: 77 73 74 78  Resp: (!) 8 11 13 15   Temp:    98.6 F (37 C)  TempSrc:    Oral  SpO2: 100% 100% 98%   Weight:      Height:        Additional Objective Labs: Basic Metabolic Panel: Recent Labs  Lab 07/15/24 1222 07/16/24 0443 07/17/24 0520  NA 135 137 140  K 4.3 3.8 4.3  CL 94* 98 99  CO2 24 22 22   GLUCOSE 221* 106* 106*  BUN 47* 49* 54*  CREATININE 6.75* 7.04* 8.27*  CALCIUM  9.0 8.7* 8.9  PHOS 5.4* 5.2* 6.8*   CBC: Recent Labs  Lab 07/12/24 1400 07/12/24 2239 07/15/24 1222 07/15/24 1638 07/16/24 0443 07/16/24 1902 07/17/24 0520  WBC 11.2*   < > 16.5* 15.4* 14.7* 13.0* 13.5*  NEUTROABS 9.2*  --   --   --   --   --   --   HGB 13.3   < > 13.3 12.1* 11.1* 11.7* 12.1*  HCT 43.8   < > 41.0 37.7* 34.5* 37.3* 38.4*  MCV 94.8   < > 88.0 87.5 87.3 89.4 88.9  PLT 311   < > 272 263 235 225 256   < > = values in this interval not displayed.   Blood Culture    Component Value Date/Time   SDES TISSUE 06/25/2024 1020   SPECREQUEST LEFT FOOT TISSUE 06/25/2024 1020   CULT  06/25/2024 1020    ABUNDANT STAPHYLOCOCCUS AUREUS RARE PSEUDOMONAS FLUORESCENS CRITICAL RESULT CALLED TO, READ BACK BY AND VERIFIED WITH: RN ANGEL M [phone] [id number] FCP SUSCEPTIBILITIES PERFORMED ON PREVIOUS CULTURE WITHIN THE LAST 5 DAYS. STAPHYLOCOCCUS AUREUS    REPTSTATUS 06/30/2024 FINAL 06/25/2024 1020     Physical Exam General: Alert, nad Heart: RRR Lungs: Clear Abdomen: non-tender Extremities: no LE edema  Dialysis Access: TDC   Medications:  cefTRIAXone  (ROCEPHIN )  IV 2 g (07/17/24 1023)   metronidazole  500 mg (07/17/24 1022)    Chlorhexidine  Gluconate Cloth  6 each Topical Q0600   Chlorhexidine  Gluconate Cloth  6 each Topical  Q0600   gabapentin   100 mg Oral QHS   insulin  aspart  0-6 Units Subcutaneous TID WC   levothyroxine   75 mcg Oral Q0600   midodrine   10 mg Oral TID WC   pantoprazole   40 mg Oral BID   polyethylene glycol  17 g Oral TID   polyethylene glycol powder  119 g Oral Once   senna-docusate  2 tablet Oral BID   SMOG  960 mL Rectal Once   thiamine   100 mg Oral Daily    Assessment/ Plan: Pt is a 64 y.o. yo male  with ESRD on HD, T2DM, HTN, PAD who was recently discharged from [facility] after bilateral LE amputations, now admitted with rectal bleeding.  Dialysis Orders:  [facility] MWF Time: 4:00 EDW: 62 kg (post HD wt 53.8 kg  on 1/28)  Flows: 400/A1.5x Bath: 3K/2.5Ca  Access: TDC Heparin : None ESA: None VDRA: None    # Lower GI bleed due to diverticular bleed are secondary to stercoral colitis.  Received units of blood transfusion, GI is following. Plan for colonoscopy 2/4.    # ESRD MWF  HD. Had HD Monday.  For GI procedure today . Next HD Thurs.     # Anemia of CKD, blood loss: Received blood transfusion with improvement of hemoglobin. Hgb 12.1   # CKD-MBD, secondary hyperparathyroidism: Monitor lab. Ca/Phos ok.    # HTN/volume: Blood pressure and volume status acceptable.  Maisie Ronnald Acosta PA-C [facility] 07/17/2024,2:56 PM    "

## 2024-07-17 NOTE — Anesthesia Preprocedure Evaluation (Signed)
"                                    Anesthesia Evaluation  Patient identified by MRN, date of birth, ID band Patient confused    Reviewed: Allergy & Precautions, NPO status , Patient's Chart, lab work & pertinent test results, Unable to perform ROS - Chart review only  Airway Mallampati: III  TM Distance: >3 FB Neck ROM: Full    Dental  (+) Dental Advisory Given   Pulmonary neg shortness of breath, neg sleep apnea, neg COPD, neg recent URI   breath sounds clear to auscultation       Cardiovascular hypertension, Pt. on medications + Peripheral Vascular Disease and +CHF   Rhythm:Regular  1. Left ventricular ejection fraction, by estimation, is 50 to 55%. The  left ventricle has low normal function. The left ventricle has no regional  wall motion abnormalities. There is mild left ventricular hypertrophy.  Left ventricular diastolic  parameters are consistent with Grade II diastolic dysfunction  (pseudonormalization). Elevated left atrial pressure. The average left  ventricular global longitudinal strain is -19.2 %.   2. Right ventricular systolic function is normal. The right ventricular  size is normal. Tricuspid regurgitation signal is inadequate for assessing  PA pressure.   3. Left atrial size was mildly dilated.   4. The mitral valve is abnormal. Moderate leaflet thickening. Trivial  mitral valve regurgitation.   5. The aortic valve is tricuspid. Aortic valve regurgitation is not  visualized. Aortic valve sclerosis/calcification is present, without any  evidence of aortic stenosis.   6. The inferior vena cava is normal in size with greater than 50%  respiratory variability, suggesting right atrial pressure of 3 mmHg.     Neuro/Psych  PSYCHIATRIC DISORDERS      negative neurological ROS     GI/Hepatic Neg liver ROS, PUD,GERD  Medicated,,  Endo/Other  diabetesHypothyroidism    Renal/GU ESRF and DialysisRenal disease     Musculoskeletal  (+) Arthritis ,     Abdominal   Peds  Hematology  (+) Blood dyscrasia, anemia   Anesthesia Other Findings   Reproductive/Obstetrics                              Anesthesia Physical Anesthesia Plan  ASA: 4  Anesthesia Plan: MAC   Post-op Pain Management: Minimal or no pain anticipated   Induction: Intravenous  PONV Risk Score and Plan: 1 and Propofol  infusion and Treatment may vary due to age or medical condition  Airway Management Planned: Nasal Cannula, Natural Airway and Simple Face Mask  Additional Equipment: None  Intra-op Plan:   Post-operative Plan:   Informed Consent:      History available from chart only and Consent reviewed with POA  Plan Discussed with: CRNA and Anesthesiologist  Anesthesia Plan Comments:          Anesthesia Quick Evaluation  "

## 2024-07-18 ENCOUNTER — Encounter (HOSPITAL_COMMUNITY): Payer: Self-pay | Admitting: Gastroenterology

## 2024-07-18 LAB — RENAL FUNCTION PANEL
Albumin: 3.1 g/dL — ABNORMAL LOW (ref 3.5–5.0)
Anion gap: 20 — ABNORMAL HIGH (ref 5–15)
BUN: 58 mg/dL — ABNORMAL HIGH (ref 8–23)
CO2: 21 mmol/L — ABNORMAL LOW (ref 22–32)
Calcium: 8.7 mg/dL — ABNORMAL LOW (ref 8.9–10.3)
Chloride: 98 mmol/L (ref 98–111)
Creatinine, Ser: 9.23 mg/dL — ABNORMAL HIGH (ref 0.61–1.24)
GFR, Estimated: 6 mL/min — ABNORMAL LOW
Glucose, Bld: 77 mg/dL (ref 70–99)
Phosphorus: 7.5 mg/dL — ABNORMAL HIGH (ref 2.5–4.6)
Potassium: 4 mmol/L (ref 3.5–5.1)
Sodium: 140 mmol/L (ref 135–145)

## 2024-07-18 LAB — CBC
HCT: 33.5 % — ABNORMAL LOW (ref 39.0–52.0)
Hemoglobin: 10.6 g/dL — ABNORMAL LOW (ref 13.0–17.0)
MCH: 28 pg (ref 26.0–34.0)
MCHC: 31.6 g/dL (ref 30.0–36.0)
MCV: 88.6 fL (ref 80.0–100.0)
Platelets: 231 10*3/uL (ref 150–400)
RBC: 3.78 MIL/uL — ABNORMAL LOW (ref 4.22–5.81)
RDW: 17 % — ABNORMAL HIGH (ref 11.5–15.5)
WBC: 11.9 10*3/uL — ABNORMAL HIGH (ref 4.0–10.5)
nRBC: 0 % (ref 0.0–0.2)

## 2024-07-18 LAB — GLUCOSE, CAPILLARY
Glucose-Capillary: 84 mg/dL (ref 70–99)
Glucose-Capillary: 112 mg/dL — ABNORMAL HIGH (ref 70–99)
Glucose-Capillary: 169 mg/dL — ABNORMAL HIGH (ref 70–99)

## 2024-07-18 MED ORDER — AMITRIPTYLINE HCL 10 MG PO TABS
10.0000 mg | ORAL_TABLET | Freq: Every day | ORAL | Status: AC
  Administered 2024-07-18 – 2024-07-19 (×2): 10 mg via ORAL
  Filled 2024-07-18 (×2): qty 1

## 2024-07-18 MED ORDER — HEPARIN SODIUM (PORCINE) 1000 UNIT/ML DIALYSIS
1000.0000 [IU] | INTRAMUSCULAR | Status: DC | PRN
  Administered 2024-07-18 (×2): 1000 [IU]

## 2024-07-18 MED ORDER — ATORVASTATIN CALCIUM 40 MG PO TABS
40.0000 mg | ORAL_TABLET | Freq: Every day | ORAL | Status: AC
  Administered 2024-07-18 – 2024-07-19 (×2): 40 mg via ORAL
  Filled 2024-07-18 (×2): qty 1

## 2024-07-18 NOTE — TOC Progression Note (Addendum)
 Transition of Care [facility]) - Progression Note    Patient Details  Name: [name] [name] MRN: [id number] Date of Birth: 02/22/1961  [facility]) CM/SW Contact  Inocente GORMAN Kindle, LCSW Phone Number: 07/18/2024, 11:20 AM  Clinical Narrative:    11:21 AM-Updated [facility] that patient is ready for discharge tomorrow. CSW contacted [employer] to request authorization for SNF and PTAR.    Expected Discharge Plan: Skilled Nursing Facility Barriers to Discharge: English As A Second Language Teacher, Continued Medical Work up               Expected Discharge Plan and Services In-house Referral: Clinical Social Work   Post Acute Care Choice: Skilled Nursing Facility Living arrangements for the past 2 months: Single Family Home                                       Social Drivers of Health (SDOH) Interventions SDOH Screenings   Food Insecurity: No Food Insecurity (07/14/2024)  Housing: Low Risk (07/14/2024)  Transportation Needs: No Transportation Needs (07/14/2024)  Utilities: Not At Risk (07/14/2024)  Social Connections: Socially Isolated (07/14/2024)  Tobacco Use: Low Risk (07/17/2024)    Readmission Risk Interventions    07/17/2024    9:05 AM 03/28/2024    1:14 PM 03/29/2023    1:45 PM  Readmission Risk Prevention Plan  Transportation Screening Complete Complete Complete  Medication Review Oceanographer) Complete Complete Complete  PCP or Specialist appointment within 3-5 days of discharge Complete  Complete  HRI or Home Care Consult Complete Complete Complete  SW Recovery Care/Counseling Consult Complete Patient refused Complete  Palliative Care Screening Complete Complete Complete  Skilled Nursing Facility Complete Patient Refused Complete

## 2024-07-18 NOTE — Progress Notes (Signed)
 Back in room from HD. UF 1000 ml per report

## 2024-07-18 NOTE — Progress Notes (Signed)
 PT Cancellation Note  Patient Details Name: [name] [name] MRN: [id number] DOB: September 22, 1960   Cancelled Treatment:    Reason Eval/Treat Not Completed: (P) Patient at procedure or test/unavailable. Pt off the floor for HD this PM. Will likely not be able to return this date, will follow up next date per POC.   [name], PT, DPT  Acute Rehabilitation Services         Office: [phone]      [name] 07/18/2024, 2:36 PM

## 2024-07-18 NOTE — Progress Notes (Signed)
 " [facility] Progress Note   Subjective:    Seen and examined patient at bedside. He's currently sleeping. Plan for HD this afternoon.  Objective Vitals:   07/18/24 0000 07/18/24 0427 07/18/24 0800 07/18/24 1200  BP: (!) 158/78 133/78 136/81 (!) 143/77  Pulse: 70 64 66 69  Resp: 11 13 15 15   Temp: 97.7 F (36.5 C) 97.6 F (36.4 C) 98 F (36.7 C) 98.6 F (37 C)  TempSrc: Oral Oral Oral Oral  SpO2:      Weight:      Height:       Physical Exam General: Sleeping, nad Heart: RRR Lungs: Clear anteriorly Abdomen: non-tender Extremities: no LE edema  Dialysis Access: [facility]   Filed Weights   07/13/24 1935 07/15/24 1504 07/17/24 1117  Weight: (S) 53.2 kg 52.2 kg 52.2 kg    Intake/Output Summary (Last 24 hours) at 07/18/2024 1246 Last data filed at 07/17/2024 2205 Gross per 24 hour  Intake 200 ml  Output --  Net 200 ml    Additional Objective Labs: Basic Metabolic Panel: Recent Labs  Lab 07/16/24 0443 07/17/24 0520 07/18/24 0926  NA 137 140 140  K 3.8 4.3 4.0  CL 98 99 98  CO2 22 22 21*  GLUCOSE 106* 106* 77  BUN 49* 54* 58*  CREATININE 7.04* 8.27* 9.23*  CALCIUM  8.7* 8.9 8.7*  PHOS 5.2* 6.8* 7.5*   Liver Function Tests: Recent Labs  Lab 07/12/24 1400 07/13/24 0546 07/14/24 0443 07/16/24 0443 07/17/24 0520 07/18/24 0926  AST 24 18  --   --   --   --   ALT 7 7  --   --   --   --   ALKPHOS 106 84  --   --   --   --   BILITOT 0.5 0.4  --   --   --   --   PROT 7.8 6.4*  --   --   --   --   ALBUMIN  3.7 3.1*   < > 3.1* 3.2* 3.1*   < > = values in this interval not displayed.   No results for input(s): LIPASE, AMYLASE in the last 168 hours. CBC: Recent Labs  Lab 07/12/24 1400 07/12/24 2239 07/16/24 0443 07/16/24 1902 07/17/24 0520 07/17/24 1944 07/18/24 0926  WBC 11.2*   < > 14.7* 13.0* 13.5* 15.8* 11.9*  NEUTROABS 9.2*  --   --   --   --   --   --   HGB 13.3   < > 11.1* 11.7* 12.1* 12.0* 10.6*  HCT 43.8   < > 34.5* 37.3* 38.4*  38.2* 33.5*  MCV 94.8   < > 87.3 89.4 88.9 89.7 88.6  PLT 311   < > 235 225 256 216 231   < > = values in this interval not displayed.   Blood Culture    Component Value Date/Time   SDES TISSUE 06/25/2024 1020   SPECREQUEST LEFT FOOT TISSUE 06/25/2024 1020   CULT  06/25/2024 1020    ABUNDANT STAPHYLOCOCCUS AUREUS RARE PSEUDOMONAS FLUORESCENS CRITICAL RESULT CALLED TO, READ BACK BY AND VERIFIED WITH: RN ANGEL M [id number] FCP SUSCEPTIBILITIES PERFORMED ON PREVIOUS CULTURE WITHIN THE LAST 5 DAYS. STAPHYLOCOCCUS AUREUS    REPTSTATUS 06/30/2024 FINAL 06/25/2024 1020    Cardiac Enzymes: No results for input(s): CKTOTAL, CKMB, CKMBINDEX, TROPONINI in the last 168 hours. CBG: Recent Labs  Lab 07/17/24 1308 07/17/24 1616 07/17/24 2124 07/18/24 0844 07/18/24 1235  GLUCAP 97 101*  152* 84 112*   Iron Studies: No results for input(s): IRON, TIBC, TRANSFERRIN, FERRITIN in the last 72 hours. Lab Results  Component Value Date   INR 1.2 07/12/2024   INR 1.2 05/13/2024   INR 1.1 05/19/2023   Studies/Results: No results found.  Medications:  cefTRIAXone  (ROCEPHIN )  IV 2 g (07/17/24 1023)   metronidazole  500 mg (07/17/24 2205)    amitriptyline   10 mg Oral QHS   atorvastatin   40 mg Oral Daily   Chlorhexidine  Gluconate Cloth  6 each Topical Q0600   Chlorhexidine  Gluconate Cloth  6 each Topical Q0600   gabapentin   100 mg Oral QHS   insulin  aspart  0-6 Units Subcutaneous TID WC   levothyroxine   75 mcg Oral Q0600   midodrine   10 mg Oral TID WC   pantoprazole   40 mg Oral BID   polyethylene glycol  17 g Oral TID   polyethylene glycol powder  119 g Oral Once   senna-docusate  2 tablet Oral BID   SMOG  960 mL Rectal Once   thiamine   100 mg Oral Daily    Dialysis Orders: [facility] MWF Time: 4:00 EDW: 62 kg (post HD wt 53.8 kg  on 1/28)  Flows: 400/A1.5x Bath: 3K/2.5Ca  Access: TDC Heparin : None ESA: None VDRA: None   Assessment/Plan: # Lower GI bleed due to  diverticular bleed are secondary to stercoral colitis.  Received units of blood transfusion, GI is following. S/p colonoscopy 2/4: solitary ulcer found in distal rectum-source of bleed per GI report.   # ESRD MWF  HD. Had HD Monday. S/p GI procedure yesterday . Next HD today.     # Anemia of CKD, blood loss: Received blood transfusion with improvement of hemoglobin. Hgb 10.6   # CKD-MBD, secondary hyperparathyroidism: Monitor lab. Ca/Phos ok.    # HTN/volume: Blood pressure and volume status acceptable.  Charmaine Piety, NP [facility] 07/18/2024,12:46 PM  LOS: 6 days    "

## 2024-07-18 NOTE — Progress Notes (Signed)
 [facility] Progress Note Patient: [name] [name] FMW:[id number] DOB: 07-Jan-1961  DOA: 07/12/2024 DOS: the patient was seen and examined on 07/18/2024  Brief Summary:  Patient is a 64 y.o.  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   Significant events: 1/30>> admit to [facility].   Significant studies: 1/30>> CT angio GI bleed: No active bleed-fecal impaction with stercoral proctitis.    Procedures: Flexible sigmoidoscopy.  Rectal ulcer likely from impaction.   Consults: GI Nephrology Podiatry   Assessment and plan: Lower GI bleed with ABLA Either diverticular bleed or secondary to stercoral colitis Continues to have multiple episodes of hematochezia S/p 2 units of PRBC on 1/31 Hemoglobin remained stable. Initial plan was for colonoscopy although patient refused to drink the prep.  Underwent enema followed by flexible sigmoidoscopy which showed bowel and rectal impaction with ulcer.  Continue stool softeners.   Stercoral colitis Significant stool burden on CT with fecal impaction Has had numerous BMs with laxatives/enemas Treated with Rocephin /Flagyl  x 5 days.   ESRD on HD MWF Nephrology following and directing HD care.   Multifactorial anemia Secondary to blood loss-ESRD See above   PAD-s/p right BKA-left TMA Antiplatelets on hold due to GI bleeding   Chronic combined systolic and diastolic heart failure Euvolemic Volume removal with HD   HTN BP stable with midodrine . All antihypertensives remain on hold.   HLD Statin   GERD PPI   Hypothyroidism Synthroid    DM-2 (A1c 4.6 in 11/13) CBG stable Currently on sliding scale insulin .   Cognitive issues/poor compliance Intermittently uncooperative-has been refusing blood work-refusing multiple meds-initially refused  transfusion but then consent obtained after talking to the patient's mother who gave consent and then subsequently spoke with the patient who then was agreeable.   Underweight. Body mass index is 16.58 kg/m.  Placing the patient at a high risk for poor outcome.   Code Status: Limited: Do not attempt resuscitation (DNR) -DNR-LIMITED -Do Not Intubate/DNI      DVT Prophylaxis: SCDs Start: 07/12/24 2207   Data review I have Reviewed nursing notes, Vitals, and Lab results. Since last encounter, pertinent lab results CBC and BMP   . I have ordered test including CBC and BMP  .   Physical exam. Vitals:   07/18/24 1730 07/18/24 1745 07/18/24 1800 07/18/24 1816  BP: (!) 154/98 132/84  (!) 117/90  Pulse: 93 100 (!) 105 94  Resp: 12 16 20 14   Temp:    98.9 F (37.2 C)  TempSrc:    Oral  SpO2: 100% 100% 100% 100%  Weight:    52.4 kg  Height:      Clear to auscultation heart S1-S2 present pulm Amputated wound appears to be improving based on the pictures. No evidence of cellulitis or edema. Subjective: Denies any acute complaint.  No nausea or vomiting.  Oral intake adequate.  Family Communication: No one at bedside.  Disposition Plan: Status is: Inpatient Remains inpatient appropriate because: Medically stable.  Will be transferred to facility once SNF is arranged.   Planned Discharge Destination: SNF Diet: Diet Order             Diet renal with fluid restriction Fluid restriction: 1200 mL Fluid; Room service appropriate? Yes; Fluid consistency: Thin  Diet effective now  MEDICATIONS: Scheduled Meds:  amitriptyline   10 mg Oral QHS   atorvastatin   40 mg Oral Daily   Chlorhexidine  Gluconate Cloth  6 each Topical Q0600   Chlorhexidine  Gluconate Cloth  6 each Topical Q0600   gabapentin   100 mg Oral QHS   insulin  aspart  0-6 Units Subcutaneous TID WC   levothyroxine   75 mcg Oral Q0600   midodrine   10 mg Oral TID WC   pantoprazole   40 mg Oral BID    polyethylene glycol  17 g Oral TID   polyethylene glycol powder  119 g Oral Once   senna-docusate  2 tablet Oral BID   SMOG  960 mL Rectal Once   thiamine   100 mg Oral Daily   Continuous Infusions:  cefTRIAXone  (ROCEPHIN )  IV 2 g (07/18/24 1339)   metronidazole  500 mg (07/18/24 1335)   PRN Meds:.bisacodyl , haloperidol  lactate, heparin , ondansetron  **OR** ondansetron  (ZOFRAN ) IV, oxyCODONE   Author: Yetta Blanch, MD  [facility] 07/18/2024  6:56 PM Between 7PM-7AM, please contact night-coverage, check [email or web address] for on call.

## 2024-07-18 NOTE — Progress Notes (Signed)
 "  07/18/24 1816  Vital Signs  Temp 98.9 F (37.2 C)  Temp Source Oral  Pulse Rate 94  Pulse Rate Source Monitor  Resp 14  BP (!) 117/90  BP Location Right Arm  BP Method Automatic  Patient Position (if appropriate) Lying  Oxygen Therapy  SpO2 100 %  O2 Device Room Air  Pain Assessment  Pain Scale 0-10  Pain Score 0  PAINAD (Pain Assessment in Advanced Dementia)  Breathing 0  Negative Vocalization 0  Facial Expression 0  Body Language 0  Consolability 0  PAINAD Score 0  Dialysis Weight  Weight 52.4 kg  Type of Weight Post-Dialysis  Estimated Dry Weight  (UNK)  Post Treatment  Dialyzer Clearance Lightly streaked  Hemodialysis Intake (mL) 0 mL  Liters Processed 71.9  Fluid Removed (mL) 1000 mL  Tolerated HD Treatment Yes  Post-Hemodialysis Comments Tx safely d/c  Education / Care Plan  Dialysis Education Provided Yes  Outpatient Plan of Care Reviewed and on Chart Yes   Patient Details  Name: [name] [name] MRN: [id number] Date of Birth: [birth year]  Lettie: 3k                   Duration: 3 hrs EDW: unk BFR: 400  Heparin : no EPO: no Vitamin D : no Iron: no DFR: 300 Access: perm cath Hepatitis Status: immune  Most Recent Labs:  Lab Results  Component Value Date/Time   HGB 10.6 (L) 07/18/2024 09:26 AM   K 4.0 07/18/2024 09:26 AM   LABPROT 15.4 (H) 07/12/2024 02:00 PM   INR 1.2 07/12/2024 02:00 PM   CALCIUM  8.7 (L) 07/18/2024 09:26 AM   ALBUMIN  3.1 (L) 07/18/2024 09:26 AM   Microbiology:  Results for orders placed or performed during the hospital encounter of 06/23/24  Blood culture (routine x 2)     Status: None   Collection Time: 06/23/24  4:48 PM   Specimen: BLOOD  Result Value Ref Range Status   Specimen Description BLOOD SITE NOT SPECIFIED  Final   Special Requests   Final    BOTTLES DRAWN AEROBIC AND ANAEROBIC Blood Culture results may not be optimal due to an inadequate volume of blood received in culture bottles   Culture   Final    NO GROWTH  5 DAYS Performed at [facility], [address] [place], [place] [address]    Report Status 06/28/2024 FINAL  Final  Blood culture (routine x 2)     Status: None   Collection Time: 06/23/24  4:53 PM   Specimen: BLOOD  Result Value Ref Range Status   Specimen Description BLOOD SITE NOT SPECIFIED  Final   Special Requests   Final    BOTTLES DRAWN AEROBIC AND ANAEROBIC Blood Culture results may not be optimal due to an inadequate volume of blood received in culture bottles   Culture   Final    NO GROWTH 5 DAYS Performed at [facility], [address] [place], [place] [address]    Report Status 06/28/2024 FINAL  Final  Surgical pcr screen     Status: Abnormal   Collection Time: 06/25/24  2:26 AM   Specimen: Nasal Mucosa; Nasal Swab  Result Value Ref Range Status   MRSA, PCR POSITIVE (A) NEGATIVE Final   Staphylococcus aureus POSITIVE (A) NEGATIVE Final    Comment: RESULT CALLED TO, READ BACK BY AND VERIFIED WITH: A LYNPHACUM RN 06/25/2024 @ 0414 BY AB (NOTE) The Xpert SA Assay (FDA approved for  NASAL specimens in patients 87 years of age and older), is one component of a comprehensive surveillance program. It is not intended to diagnose infection nor to guide or monitor treatment. Performed at [facility], [address] [place], [place] [address]   Aerobic/Anaerobic Culture w Gram Stain (surgical/deep wound)     Status: None   Collection Time: 06/25/24 10:19 AM   Specimen: PATH Soft tissue  Result Value Ref Range Status   Specimen Description TISSUE  Final   Special Requests LEFT FOOT ABSCESS  Final   Gram Stain NO WBC SEEN RARE GRAM POSITIVE COCCI   Final   Culture   Final    MODERATE METHICILLIN RESISTANT STAPHYLOCOCCUS AUREUS NO ANAEROBES ISOLATED Performed at [facility], [address], [place], [place] [address]    Report Status 06/30/2024 FINAL  Final   Organism ID, Bacteria METHICILLIN RESISTANT STAPHYLOCOCCUS AUREUS  Final       Susceptibility   Methicillin resistant staphylococcus aureus - MIC*    CIPROFLOXACIN >=8 RESISTANT Resistant     ERYTHROMYCIN >=8 RESISTANT Resistant     GENTAMICIN <=0.5 SENSITIVE Sensitive     OXACILLIN >=4 RESISTANT Resistant     TETRACYCLINE <=1 SENSITIVE Sensitive     VANCOMYCIN  1 SENSITIVE Sensitive     TRIMETH/SULFA >=320 RESISTANT Resistant     CLINDAMYCIN <=0.25 SENSITIVE Sensitive     RIFAMPIN <=0.5 SENSITIVE Sensitive     Inducible Clindamycin NEGATIVE Sensitive     LINEZOLID 2 SENSITIVE Sensitive     * MODERATE METHICILLIN RESISTANT STAPHYLOCOCCUS AUREUS  Aerobic/Anaerobic Culture w Gram Stain (surgical/deep wound)     Status: None   Collection Time: 06/25/24 10:20 AM   Specimen: PATH Soft tissue  Result Value Ref Range Status   Specimen Description TISSUE  Final   Special Requests LEFT FOOT TISSUE  Final   Gram Stain   Final    NO WBC SEEN ABUNDANT GRAM POSITIVE COCCI Performed at [facility], [address], [place], [place] [address]    Culture   Final    ABUNDANT STAPHYLOCOCCUS AUREUS RARE PSEUDOMONAS FLUORESCENS CRITICAL RESULT CALLED TO, READ BACK BY AND VERIFIED WITH: RN ANGEL M 1033 [id number] FCP SUSCEPTIBILITIES PERFORMED ON PREVIOUS CULTURE WITHIN THE LAST 5 DAYS. STAPHYLOCOCCUS AUREUS    Report Status 06/30/2024 FINAL  Final   Organism ID, Bacteria PSEUDOMONAS FLUORESCENS  Final      Susceptibility   Pseudomonas fluorescens - MIC*    MEROPENEM <=0.25 SENSITIVE Sensitive     CIPROFLOXACIN <=0.06 SENSITIVE Sensitive     PIP/TAZO Value in next row Sensitive      8 SENSITIVEThis is a modified FDA-approved test that has been validated and its performance characteristics determined by the reporting laboratory.  This laboratory is certified under the Clinical Laboratory Improvement Amendments CLIA as qualified to perform high complexity clinical laboratory testing.    CEFEPIME  Value in next row Sensitive      8 SENSITIVEThis is a modified FDA-approved test  that has been validated and its performance characteristics determined by the reporting laboratory.  This laboratory is certified under the Clinical Laboratory Improvement Amendments CLIA as qualified to perform high complexity clinical laboratory testing.    * RARE PSEUDOMONAS FLUORESCENS   Antibiotics: no Radiology: no Tolerated tx well, system clotted, new lines hang. UF 1000 Ml, 2 BM in HD. Tolerated tx well. VSS, alert and oriented.   Nena Sayres 07/18/2024, 6:20 PM   "

## 2024-07-19 LAB — RENAL FUNCTION PANEL
Albumin: 3 g/dL — ABNORMAL LOW (ref 3.5–5.0)
Anion gap: 13 (ref 5–15)
BUN: 22 mg/dL (ref 8–23)
CO2: 24 mmol/L (ref 22–32)
Calcium: 8.2 mg/dL — ABNORMAL LOW (ref 8.9–10.3)
Chloride: 96 mmol/L — ABNORMAL LOW (ref 98–111)
Creatinine, Ser: 5.04 mg/dL — ABNORMAL HIGH (ref 0.61–1.24)
GFR, Estimated: 12 mL/min — ABNORMAL LOW
Glucose, Bld: 152 mg/dL — ABNORMAL HIGH (ref 70–99)
Phosphorus: 3.9 mg/dL (ref 2.5–4.6)
Potassium: 3.4 mmol/L — ABNORMAL LOW (ref 3.5–5.1)
Sodium: 133 mmol/L — ABNORMAL LOW (ref 135–145)

## 2024-07-19 LAB — GLUCOSE, CAPILLARY
Glucose-Capillary: 137 mg/dL — ABNORMAL HIGH (ref 70–99)
Glucose-Capillary: 157 mg/dL — ABNORMAL HIGH (ref 70–99)
Glucose-Capillary: 197 mg/dL — ABNORMAL HIGH (ref 70–99)
Glucose-Capillary: 116 mg/dL — ABNORMAL HIGH (ref 70–99)

## 2024-07-19 LAB — CBC
HCT: 32.7 % — ABNORMAL LOW (ref 39.0–52.0)
Hemoglobin: 10.4 g/dL — ABNORMAL LOW (ref 13.0–17.0)
MCH: 28.3 pg (ref 26.0–34.0)
MCHC: 31.8 g/dL (ref 30.0–36.0)
MCV: 89.1 fL (ref 80.0–100.0)
Platelets: 190 10*3/uL (ref 150–400)
RBC: 3.67 MIL/uL — ABNORMAL LOW (ref 4.22–5.81)
RDW: 16.3 % — ABNORMAL HIGH (ref 11.5–15.5)
WBC: 10.3 10*3/uL (ref 4.0–10.5)
nRBC: 0 % (ref 0.0–0.2)

## 2024-07-19 MED ORDER — RENA-VITE PO TABS
1.0000 | ORAL_TABLET | Freq: Every day | ORAL | Status: AC
  Administered 2024-07-19: 1 via ORAL
  Filled 2024-07-19: qty 1

## 2024-07-19 MED ORDER — CHLORHEXIDINE GLUCONATE CLOTH 2 % EX PADS: 6.0000 | MEDICATED_PAD | Freq: Every day | CUTANEOUS | Status: AC

## 2024-07-19 MED ORDER — NEPRO/CARBSTEADY PO LIQD
237.0000 mL | Freq: Three times a day (TID) | ORAL | Status: AC
  Administered 2024-07-19: 237 mL via ORAL

## 2024-07-19 MED ORDER — JUVEN PO PACK: 1.0000 | PACK | Freq: Two times a day (BID) | ORAL | Status: AC

## 2024-07-19 NOTE — TOC Progression Note (Addendum)
 Transition of Care [facility]) - Progression Note    Patient Details  Name: [name] [name] MRN: [id number] Date of Birth: Dec 03, 1960  Transition of [facility]) CM/SW Contact  Inocente GORMAN Kindle, LCSW Phone Number: 07/19/2024, 8:38 AM  Clinical Narrative:    8:38 AM-Awaiting insurance decision for return to [facility].   3:18 PM-Insurance requesting Peer to Peer for medical instability due by 12pm tomorrow, p. [phone]. Info provided to MD.    4:42pm-Insurance approval received, Auth [id number] for 7 days. CSW inquiring on bed availability at [facility].  5:29 PM-Per [facility] they are now not able to admit patient until Monday.    Expected Discharge Plan: Skilled Nursing Facility Barriers to Discharge: English As A Second Language Teacher, Continued Medical Work up               Expected Discharge Plan and Services In-house Referral: Clinical Social Work   Post Acute Care Choice: Skilled Nursing Facility Living arrangements for the past 2 months: Single Family Home                                       Social Drivers of Health (SDOH) Interventions SDOH Screenings   Food Insecurity: No Food Insecurity (07/14/2024)  Housing: Low Risk (07/14/2024)  Transportation Needs: No Transportation Needs (07/14/2024)  Utilities: Not At Risk (07/14/2024)  Social Connections: Socially Isolated (07/14/2024)  Tobacco Use: Low Risk (07/17/2024)    Readmission Risk Interventions    07/17/2024    9:05 AM 03/28/2024    1:14 PM 03/29/2023    1:45 PM  Readmission Risk Prevention Plan  Transportation Screening Complete Complete Complete  Medication Review Oceanographer) Complete Complete Complete  PCP or Specialist appointment within 3-5 days of discharge Complete  Complete  HRI or Home Care Consult Complete Complete Complete  SW Recovery Care/Counseling Consult Complete Patient refused Complete  Palliative Care Screening Complete Complete Complete  Skilled Nursing Facility Complete Patient  Refused Complete

## 2024-07-19 NOTE — Progress Notes (Signed)
 " [facility] Progress Note   Subjective:    Seen and examined patient at bedside. He's talking on the phone. Denies SOB, CP, and N/V. Next HD 2/7 and switch back to MWF schedule next week.  Objective Vitals:   07/19/24 0400 07/19/24 0740 07/19/24 1159 07/19/24 1600  BP: (!) 167/87 (!) 124/90 (!) 141/72 120/74  Pulse:  78 93 82  Resp: (!) 21 13 15    Temp:  98 F (36.7 C) (!) 97.5 F (36.4 C) 97.7 F (36.5 C)  TempSrc: Oral Oral Oral Oral  SpO2:   100% 98%  Weight:      Height:       Physical Exam General: Awake, alert, nad Heart: RRR Lungs: Clear anteriorly Abdomen: non-tender Extremities: no LE edema  Dialysis Access: [facility]  Filed Weights   07/17/24 1117 07/18/24 1401 07/18/24 1816  Weight: 52.2 kg 53.4 kg 52.4 kg    Intake/Output Summary (Last 24 hours) at 07/19/2024 1633 Last data filed at 07/18/2024 1816 Gross per 24 hour  Intake --  Output 1000 ml  Net -1000 ml    Additional Objective Labs: Basic Metabolic Panel: Recent Labs  Lab 07/17/24 0520 07/18/24 0926 07/19/24 0609  NA 140 140 133*  K 4.3 4.0 3.4*  CL 99 98 96*  CO2 22 21* 24  GLUCOSE 106* 77 152*  BUN 54* 58* 22  CREATININE 8.27* 9.23* 5.04*  CALCIUM  8.9 8.7* 8.2*  PHOS 6.8* 7.5* 3.9   Liver Function Tests: Recent Labs  Lab 07/13/24 0546 07/14/24 0443 07/17/24 0520 07/18/24 0926 07/19/24 0609  AST 18  --   --   --   --   ALT 7  --   --   --   --   ALKPHOS 84  --   --   --   --   BILITOT 0.4  --   --   --   --   PROT 6.4*  --   --   --   --   ALBUMIN  3.1*   < > 3.2* 3.1* 3.0*   < > = values in this interval not displayed.   No results for input(s): LIPASE, AMYLASE in the last 168 hours. CBC: Recent Labs  Lab 07/16/24 1902 07/17/24 0520 07/17/24 1944 07/18/24 0926 07/19/24 0609  WBC 13.0* 13.5* 15.8* 11.9* 10.3  HGB 11.7* 12.1* 12.0* 10.6* 10.4*  HCT 37.3* 38.4* 38.2* 33.5* 32.7*  MCV 89.4 88.9 89.7 88.6 89.1  PLT 225 256 216 231 190   Blood Culture     Component Value Date/Time   SDES TISSUE 06/25/2024 1020   SPECREQUEST LEFT FOOT TISSUE 06/25/2024 1020   CULT  06/25/2024 1020    ABUNDANT STAPHYLOCOCCUS AUREUS RARE PSEUDOMONAS FLUORESCENS CRITICAL RESULT CALLED TO, READ BACK BY AND VERIFIED WITH: RN ANGEL M 1033 [id number] FCP SUSCEPTIBILITIES PERFORMED ON PREVIOUS CULTURE WITHIN THE LAST 5 DAYS. STAPHYLOCOCCUS AUREUS    REPTSTATUS 06/30/2024 FINAL 06/25/2024 1020    Cardiac Enzymes: No results for input(s): CKTOTAL, CKMB, CKMBINDEX, TROPONINI in the last 168 hours. CBG: Recent Labs  Lab 07/18/24 1235 07/18/24 2228 07/19/24 0740 07/19/24 1157 07/19/24 1624  GLUCAP 112* 169* 137* 157* 197*   Iron Studies: No results for input(s): IRON, TIBC, TRANSFERRIN, FERRITIN in the last 72 hours. Lab Results  Component Value Date   INR 1.2 07/12/2024   INR 1.2 05/13/2024   INR 1.1 05/19/2023   Studies/Results: No results found.  Medications:  cefTRIAXone  (ROCEPHIN )  IV 2 g (07/19/24  1040)   metronidazole  500 mg (07/19/24 1041)    amitriptyline   10 mg Oral QHS   atorvastatin   40 mg Oral Daily   Chlorhexidine  Gluconate Cloth  6 each Topical Q0600   Chlorhexidine  Gluconate Cloth  6 each Topical Q0600   gabapentin   100 mg Oral QHS   insulin  aspart  0-6 Units Subcutaneous TID WC   levothyroxine   75 mcg Oral Q0600   midodrine   10 mg Oral TID WC   pantoprazole   40 mg Oral BID   polyethylene glycol  17 g Oral TID   polyethylene glycol powder  119 g Oral Once   senna-docusate  2 tablet Oral BID   SMOG  960 mL Rectal Once   thiamine   100 mg Oral Daily    Dialysis Orders: [facility] MWF Time: 4:00 EDW: 62 kg (post HD wt 53.8 kg  on 1/28)  Flows: 400/A1.5x Bath: 3K/2.5Ca  Access: TDC Heparin : None ESA: None VDRA: None  Assessment/Plan: # Lower GI bleed due to diverticular bleed are secondary to stercoral colitis.  Received units of blood transfusion, GI is following. S/p colonoscopy 2/4: solitary ulcer found in distal  rectum-source of bleed per GI report.   # ESRD MWF  HD. Had HD 2/3 and 2/5. S/p GI procedure 2/4. Next HD 2/7 then switch to MWF schedule next week.   # Anemia of CKD, blood loss: S/p 2 units total during this admit so far on 1/31. Hgb 10.4   # CKD-MBD, secondary hyperparathyroidism: Monitor lab. Ca/Phos ok.    # HTN/volume: Blood pressure and volume status acceptable.  Charmaine Piety, NP [facility] 07/19/2024,4:33 PM  LOS: 7 days    "

## 2024-07-19 NOTE — Progress Notes (Signed)
 [facility] Progress Note Patient: [name] [name] FMW:[id number] DOB: August 05, 1960  DOA: 07/12/2024 DOS: the patient was seen and examined on 07/19/2024  Brief Summary: Patient is a 64 y.o.  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   Significant events: 1/30>> admit to [facility].   Significant studies: 1/30>> CT angio GI bleed: No active bleed-fecal impaction with stercoral proctitis.    Procedures: Flexible sigmoidoscopy.  Rectal ulcer likely from impaction.   Consults: GI Nephrology Podiatry   Assessment and plan: Lower GI bleed  Acute blood loss anemia. Either diverticular bleed or secondary to stercoral colitis.  Presented with multiple episodes of hematochezia. S/p 2 units of PRBC on 1/31 Initial plan was for colonoscopy although patient refused to drink the prep.  Underwent enema followed by flexible sigmoidoscopy which showed bowel and rectal impaction with ulcer.  Continue stool softeners. So far no hematochezia noted since last 48 hours. Hemoglobin at baseline appears to be around 11-13.  Hemoglobin currently around 10.4 and stable for last 48 hours. Will check iron B12 folic acid    Stercoral colitis Significant stool burden on CT with fecal impaction Has had numerous BMs with laxatives/enemas Treated with Rocephin /Flagyl  x 7 days.  Last day    ESRD on HD MWF Nephrology following and directing HD care.   Multifactorial anemia Secondary to blood loss-ESRD See above   PAD-s/p right BKA-left TMA Antiplatelets on hold due to GI bleeding   Chronic combined systolic and diastolic heart failure Euvolemic Volume removal with HD   HTN BP stable with midodrine . All antihypertensives remain on hold.   HLD Continue Statin   GERD Continue PPI  Hypothyroidism Synthroid     DM-2 (A1c 4.6 in 11/13) CBG stable Currently on sliding scale insulin .   Cognitive issues/ poor compliance Intermittently uncooperative-has been refusing blood work-refusing multiple meds-initially refused transfusion but then consent obtained after talking to the patient's mother who gave consent and then subsequently spoke with the patient who then was agreeable.   Underweight. Body mass index is 16.58 kg/m.  Adding Juven and Nepro. Multivitamins as well. Body mass index is 16.58 kg/m.     Code Status: Limited: Do not attempt resuscitation (DNR) -DNR-LIMITED -Do Not Intubate/DNI    DVT Prophylaxis: SCDs Start: 07/12/24 2207   Data review I have Reviewed nursing notes, Vitals, and Lab results. Since last encounter, pertinent lab results CBC and BMP   . I have ordered test including CBC, anemia panel  . I have discussed pt's care plan and test results with physician advisor for patient's insurance  .  Physical exam. Vitals:   07/19/24 0400 07/19/24 0740 07/19/24 1159 07/19/24 1600  BP: (!) 167/87 (!) 124/90 (!) 141/72 120/74  Pulse:  78 93 82  Resp: (!) 21 13 15    Temp:  98 F (36.7 C) (!) 97.5 F (36.4 C) 97.7 F (36.5 C)  TempSrc: Oral Oral Oral Oral  SpO2:   100% 98%  Weight:      Height:      Clear to auscultation. S1-S2 present Bowel sound present No edema. Surgical wounds healing well.  Subjective: Denies any acute complaint.  No nausea no vomiting.  Family Communication: No one at bedside.  Disposition Plan: Status is: Inpatient Remains inpatient appropriate because: Awaiting placement.   Planned Discharge Destination: SNF Diet: Diet Order  Diet renal with fluid restriction Fluid restriction: 1200 mL Fluid; Room service appropriate? Yes; Fluid consistency: Thin  Diet effective now                   MEDICATIONS: Scheduled Meds:  amitriptyline   10 mg Oral QHS   atorvastatin   40 mg Oral Daily   [START ON 07/20/2024] Chlorhexidine   Gluconate Cloth  6 each Topical Q0600   feeding supplement (NEPRO CARB STEADY)  237 mL Oral TID BM   gabapentin   100 mg Oral QHS   insulin  aspart  0-6 Units Subcutaneous TID WC   levothyroxine   75 mcg Oral Q0600   midodrine   10 mg Oral TID WC   [START ON 07/20/2024] nutrition supplement (JUVEN)  1 packet Oral BID BM   pantoprazole   40 mg Oral BID   polyethylene glycol  17 g Oral TID   senna-docusate  2 tablet Oral BID   thiamine   100 mg Oral Daily   Continuous Infusions:  cefTRIAXone  (ROCEPHIN )  IV 2 g (07/19/24 1040)   metronidazole  500 mg (07/19/24 1041)   PRN Meds:.bisacodyl , haloperidol  lactate, ondansetron  **OR** ondansetron  (ZOFRAN ) IV, oxyCODONE   Author: Yetta Blanch, MD  [facility] 07/19/2024  5:57 PM Between 7PM-7AM, please contact night-coverage, check [email or web address] for on call.

## 2024-07-19 NOTE — Plan of Care (Signed)

## 2024-07-19 NOTE — Progress Notes (Signed)
 Physical Therapy Treatment Patient Details Name: [name] [name] MRN: [id number] DOB: Apr 28, 1961 Today's Date: 07/19/2024   History of Present Illness Pt is a 64 y.o. male admitted from [facility] 1/30 for rectal bleeding. Recent hospital stay Jan 2026, undergoing L TMA to lisfranc amp 1/13 and R BKA 1/14. He discharged to [facility] 1/27. PMH:  DM2 (poorly controlled), ESRD on HD MWF, severe PAD, HLD, hypothyroidism, cardiomyopathy, OA, HTN, GERD    PT Comments  Pt received in supine and declines mobility, but is agreeable to supine exercises. Pt requires increased cues and demonstration for BLE exercise techniques due to poor attention and sequencing. Pt demonstrates BLE weakness and quick fatigue. Pt encouraged to perform independently for strengthening. Pt continues to benefit from PT services to progress toward functional mobility goals.    If plan is discharge home, recommend the following: Two people to help with walking and/or transfers;Two people to help with bathing/dressing/bathroom;Help with stairs or ramp for entrance;Assist for transportation;Assistance with cooking/housework;Direct supervision/assist for medications management;Direct supervision/assist for financial management;Supervision due to cognitive status   Can travel by private vehicle     No  Equipment Recommendations  Other (comment) (defer to next venue)    Recommendations for Other Services       Precautions / Restrictions Precautions Precautions: Fall Recall of Precautions/Restrictions: Impaired Precaution/Restrictions Comments: Contact precs; poor recall of BLE NWB status; lability Restrictions Weight Bearing Restrictions Per Provider Order: Yes RLE Weight Bearing Per Provider Order: Non weight bearing LLE Weight Bearing Per Provider Order: Non weight bearing     Mobility  Bed Mobility               General bed mobility comments: pt delined EOB    Transfers                         Ambulation/Gait                   Stairs             Wheelchair Mobility     Tilt Bed    Modified Rankin (Stroke Patients Only)       Balance                                            Communication Communication Communication: Impaired Factors Affecting Communication: Difficulty expressing self  Cognition Arousal: Alert Behavior During Therapy: Flat affect   PT - Cognitive impairments: History of cognitive impairments, Attention, Memory, Sequencing, Problem solving, Safety/Judgement, Awareness, No family/caregiver present to determine baseline, Orientation                       PT - Cognition Comments: Poor command following and sequencing despite max cues and demonstration. Pt perseverating on foot during session Following commands: Impaired Following commands impaired: Follows one step commands inconsistently, Follows one step commands with increased time    Cueing Cueing Techniques: Verbal cues, Gestural cues, Tactile cues, Visual cues  Exercises Amputee Exercises Quad Sets: AROM, Both, 5 reps Knee Flexion: AROM, Supine, 5 reps, Both Straight Leg Raises: AROM, Supine, Both, 5 reps    General Comments        Pertinent Vitals/Pain Pain Assessment Pain Assessment: Faces Faces Pain Scale: Hurts a little bit Pain Location: RLE Pain Descriptors / Indicators: Discomfort, Grimacing, Tender Pain Intervention(s): Limited  activity within patient's tolerance     PT Goals (current goals can now be found in the care plan section) Acute Rehab PT Goals Patient Stated Goal: home PT Goal Formulation: With patient Time For Goal Achievement: 07/29/24 Progress towards PT goals: Not progressing toward goals - comment (self limiting)    Frequency    Min 1X/week       AM-PAC PT 6 Clicks Mobility   Outcome Measure  Help needed turning from your back to your side while in a flat bed without using bedrails?: A  Little Help needed moving from lying on your back to sitting on the side of a flat bed without using bedrails?: Total Help needed moving to and from a bed to a chair (including a wheelchair)?: Total Help needed standing up from a chair using your arms (e.g., wheelchair or bedside chair)?: Total Help needed to walk in hospital room?: Total Help needed climbing 3-5 steps with a railing? : Total 6 Click Score: 8    End of Session   Activity Tolerance: Patient limited by fatigue Patient left: in bed;with call bell/phone within reach;with bed alarm set Nurse Communication: Mobility status PT Visit Diagnosis: Other abnormalities of gait and mobility (R26.89);Muscle weakness (generalized) (M62.81);Pain Pain - Right/Left: Right Pain - part of body: Leg     Time: 1446-1501 PT Time Calculation (min) (ACUTE ONLY): 15 min  Charges:    $Therapeutic Exercise: 8-22 mins PT General Charges $$ ACUTE PT VISIT: 1 Visit                    Darryle George, PTA Acute Rehabilitation Services Secure Chat Preferred  [phone]    Darryle George 07/19/2024, 3:28 PM

## 2024-07-30 ENCOUNTER — Ambulatory Visit
